# Patient Record
Sex: Female | Born: 1955 | Race: Black or African American | Hispanic: No | Marital: Single | Smoking: Former smoker
Health system: Southern US, Community
[De-identification: ages and names within clinical notes are randomized; demographics above are authoritative.]

## PROBLEM LIST (undated history)

## (undated) DIAGNOSIS — N63 Unspecified lump in unspecified breast: Secondary | ICD-10-CM

## (undated) DIAGNOSIS — C50919 Malignant neoplasm of unspecified site of unspecified female breast: Secondary | ICD-10-CM

## (undated) DIAGNOSIS — G219 Secondary parkinsonism, unspecified: Secondary | ICD-10-CM

## (undated) DIAGNOSIS — Z9289 Personal history of other medical treatment: Secondary | ICD-10-CM

## (undated) DIAGNOSIS — F329 Major depressive disorder, single episode, unspecified: Secondary | ICD-10-CM

## (undated) DIAGNOSIS — G43909 Migraine, unspecified, not intractable, without status migrainosus: Secondary | ICD-10-CM

## (undated) DIAGNOSIS — F209 Schizophrenia, unspecified: Secondary | ICD-10-CM

## (undated) DIAGNOSIS — M25562 Pain in left knee: Secondary | ICD-10-CM

## (undated) DIAGNOSIS — N183 Chronic kidney disease, stage 3 unspecified: Secondary | ICD-10-CM

## (undated) DIAGNOSIS — Z8711 Personal history of peptic ulcer disease: Secondary | ICD-10-CM

## (undated) DIAGNOSIS — F32A Depression, unspecified: Secondary | ICD-10-CM

## (undated) DIAGNOSIS — R000 Tachycardia, unspecified: Secondary | ICD-10-CM

## (undated) DIAGNOSIS — F419 Anxiety disorder, unspecified: Secondary | ICD-10-CM

## (undated) DIAGNOSIS — E039 Hypothyroidism, unspecified: Secondary | ICD-10-CM

## (undated) DIAGNOSIS — J189 Pneumonia, unspecified organism: Secondary | ICD-10-CM

## (undated) DIAGNOSIS — G8929 Other chronic pain: Secondary | ICD-10-CM

## (undated) DIAGNOSIS — M1990 Unspecified osteoarthritis, unspecified site: Secondary | ICD-10-CM

## (undated) DIAGNOSIS — E785 Hyperlipidemia, unspecified: Secondary | ICD-10-CM

## (undated) DIAGNOSIS — Z8719 Personal history of other diseases of the digestive system: Secondary | ICD-10-CM

## (undated) DIAGNOSIS — R51 Headache: Secondary | ICD-10-CM

## (undated) DIAGNOSIS — E119 Type 2 diabetes mellitus without complications: Secondary | ICD-10-CM

## (undated) DIAGNOSIS — Z9989 Dependence on other enabling machines and devices: Secondary | ICD-10-CM

## (undated) DIAGNOSIS — N200 Calculus of kidney: Secondary | ICD-10-CM

## (undated) DIAGNOSIS — M545 Low back pain, unspecified: Secondary | ICD-10-CM

## (undated) DIAGNOSIS — K219 Gastro-esophageal reflux disease without esophagitis: Secondary | ICD-10-CM

## (undated) DIAGNOSIS — Z923 Personal history of irradiation: Secondary | ICD-10-CM

## (undated) DIAGNOSIS — H8110 Benign paroxysmal vertigo, unspecified ear: Secondary | ICD-10-CM

## (undated) DIAGNOSIS — E669 Obesity, unspecified: Secondary | ICD-10-CM

## (undated) DIAGNOSIS — R0600 Dyspnea, unspecified: Secondary | ICD-10-CM

## (undated) DIAGNOSIS — E1142 Type 2 diabetes mellitus with diabetic polyneuropathy: Secondary | ICD-10-CM

## (undated) DIAGNOSIS — G4733 Obstructive sleep apnea (adult) (pediatric): Secondary | ICD-10-CM

## (undated) DIAGNOSIS — K859 Acute pancreatitis without necrosis or infection, unspecified: Secondary | ICD-10-CM

## (undated) DIAGNOSIS — D649 Anemia, unspecified: Secondary | ICD-10-CM

## (undated) DIAGNOSIS — R269 Unspecified abnormalities of gait and mobility: Secondary | ICD-10-CM

## (undated) DIAGNOSIS — R413 Other amnesia: Secondary | ICD-10-CM

## (undated) DIAGNOSIS — I10 Essential (primary) hypertension: Secondary | ICD-10-CM

## (undated) DIAGNOSIS — B009 Herpesviral infection, unspecified: Secondary | ICD-10-CM

## (undated) DIAGNOSIS — E079 Disorder of thyroid, unspecified: Secondary | ICD-10-CM

## (undated) DIAGNOSIS — R519 Headache, unspecified: Secondary | ICD-10-CM

## (undated) DIAGNOSIS — M25561 Pain in right knee: Secondary | ICD-10-CM

## (undated) HISTORY — DX: Depression, unspecified: F32.A

## (undated) HISTORY — DX: Disorder of thyroid, unspecified: E07.9

## (undated) HISTORY — DX: Pain in left knee: M25.562

## (undated) HISTORY — DX: Personal history of irradiation: Z92.3

## (undated) HISTORY — PX: CHOLECYSTECTOMY: SHX55

## (undated) HISTORY — DX: Benign paroxysmal vertigo, unspecified ear: H81.10

## (undated) HISTORY — DX: Obesity, unspecified: E66.9

## (undated) HISTORY — PX: OTHER SURGICAL HISTORY: SHX169

## (undated) HISTORY — DX: Unspecified lump in unspecified breast: N63.0

## (undated) HISTORY — DX: Tachycardia, unspecified: R00.0

## (undated) HISTORY — DX: Secondary parkinsonism, unspecified: G21.9

## (undated) HISTORY — DX: Gastro-esophageal reflux disease without esophagitis: K21.9

## (undated) HISTORY — PX: SHOULDER OPEN ROTATOR CUFF REPAIR: SHX2407

## (undated) HISTORY — PX: TOTAL KNEE ARTHROPLASTY: SHX125

## (undated) HISTORY — PX: FOOT FRACTURE SURGERY: SHX645

## (undated) HISTORY — PX: JOINT REPLACEMENT: SHX530

## (undated) HISTORY — DX: Anxiety disorder, unspecified: F41.9

## (undated) HISTORY — DX: Essential (primary) hypertension: I10

## (undated) HISTORY — DX: Acute pancreatitis without necrosis or infection, unspecified: K85.90

## (undated) HISTORY — DX: Anemia, unspecified: D64.9

## (undated) HISTORY — DX: Major depressive disorder, single episode, unspecified: F32.9

## (undated) HISTORY — DX: Malignant neoplasm of unspecified site of unspecified female breast: C50.919

## (undated) HISTORY — DX: Hyperlipidemia, unspecified: E78.5

## (undated) HISTORY — PX: ABDOMINAL HYSTERECTOMY: SHX81

## (undated) HISTORY — DX: Pain in right knee: M25.561

## (undated) HISTORY — DX: Herpesviral infection, unspecified: B00.9

## (undated) HISTORY — DX: Other amnesia: R41.3

## (undated) HISTORY — DX: Unspecified abnormalities of gait and mobility: R26.9

## (undated) HISTORY — PX: THYROIDECTOMY: SHX17

## (undated) HISTORY — DX: Type 2 diabetes mellitus with diabetic polyneuropathy: E11.42

---

## 1998-05-18 ENCOUNTER — Emergency Department (HOSPITAL_COMMUNITY): Admission: EM | Admit: 1998-05-18 | Discharge: 1998-05-19 | Payer: Self-pay | Admitting: Emergency Medicine

## 1998-08-29 ENCOUNTER — Other Ambulatory Visit: Admission: RE | Admit: 1998-08-29 | Discharge: 1998-08-29 | Payer: Self-pay | Admitting: Obstetrics

## 1998-09-17 ENCOUNTER — Encounter (HOSPITAL_BASED_OUTPATIENT_CLINIC_OR_DEPARTMENT_OTHER): Payer: Self-pay | Admitting: General Surgery

## 1998-09-19 ENCOUNTER — Ambulatory Visit (HOSPITAL_COMMUNITY): Admission: RE | Admit: 1998-09-19 | Discharge: 1998-09-19 | Payer: Self-pay | Admitting: General Surgery

## 1998-12-21 ENCOUNTER — Emergency Department (HOSPITAL_COMMUNITY): Admission: EM | Admit: 1998-12-21 | Discharge: 1998-12-21 | Payer: Self-pay | Admitting: Emergency Medicine

## 1999-01-23 ENCOUNTER — Encounter: Payer: Self-pay | Admitting: Family Medicine

## 1999-01-23 ENCOUNTER — Ambulatory Visit (HOSPITAL_COMMUNITY): Admission: RE | Admit: 1999-01-23 | Discharge: 1999-01-23 | Payer: Self-pay | Admitting: Family Medicine

## 1999-03-18 ENCOUNTER — Encounter (HOSPITAL_BASED_OUTPATIENT_CLINIC_OR_DEPARTMENT_OTHER): Payer: Self-pay | Admitting: General Surgery

## 1999-03-18 ENCOUNTER — Encounter: Admission: RE | Admit: 1999-03-18 | Discharge: 1999-03-18 | Payer: Self-pay | Admitting: General Surgery

## 1999-05-30 ENCOUNTER — Ambulatory Visit (HOSPITAL_COMMUNITY): Admission: RE | Admit: 1999-05-30 | Discharge: 1999-05-30 | Payer: Self-pay | Admitting: *Deleted

## 1999-07-31 ENCOUNTER — Other Ambulatory Visit: Admission: RE | Admit: 1999-07-31 | Discharge: 1999-07-31 | Payer: Self-pay | Admitting: Obstetrics

## 1999-08-02 ENCOUNTER — Encounter: Payer: Self-pay | Admitting: Obstetrics

## 1999-08-02 ENCOUNTER — Encounter: Admission: RE | Admit: 1999-08-02 | Discharge: 1999-08-02 | Payer: Self-pay | Admitting: Obstetrics

## 1999-08-07 ENCOUNTER — Ambulatory Visit: Admission: RE | Admit: 1999-08-07 | Discharge: 1999-08-07 | Payer: Self-pay | Admitting: Gynecology

## 1999-08-12 ENCOUNTER — Encounter: Payer: Self-pay | Admitting: Gynecology

## 1999-08-12 ENCOUNTER — Ambulatory Visit (HOSPITAL_COMMUNITY): Admission: RE | Admit: 1999-08-12 | Discharge: 1999-08-12 | Payer: Self-pay | Admitting: Gynecology

## 1999-08-15 ENCOUNTER — Emergency Department (HOSPITAL_COMMUNITY): Admission: EM | Admit: 1999-08-15 | Discharge: 1999-08-15 | Payer: Self-pay | Admitting: Emergency Medicine

## 1999-09-27 ENCOUNTER — Ambulatory Visit (HOSPITAL_COMMUNITY): Admission: RE | Admit: 1999-09-27 | Discharge: 1999-09-27 | Payer: Self-pay | Admitting: Family Medicine

## 1999-09-27 ENCOUNTER — Encounter: Payer: Self-pay | Admitting: Family Medicine

## 1999-09-29 ENCOUNTER — Emergency Department (HOSPITAL_COMMUNITY): Admission: EM | Admit: 1999-09-29 | Discharge: 1999-09-29 | Payer: Self-pay | Admitting: Emergency Medicine

## 1999-10-05 ENCOUNTER — Emergency Department (HOSPITAL_COMMUNITY): Admission: EM | Admit: 1999-10-05 | Discharge: 1999-10-05 | Payer: Self-pay

## 1999-10-10 ENCOUNTER — Ambulatory Visit (HOSPITAL_BASED_OUTPATIENT_CLINIC_OR_DEPARTMENT_OTHER): Admission: RE | Admit: 1999-10-10 | Discharge: 1999-10-10 | Payer: Self-pay | Admitting: Family Medicine

## 1999-10-30 ENCOUNTER — Encounter: Admission: RE | Admit: 1999-10-30 | Discharge: 1999-12-10 | Payer: Self-pay | Admitting: Orthopedic Surgery

## 2001-07-19 ENCOUNTER — Encounter (INDEPENDENT_AMBULATORY_CARE_PROVIDER_SITE_OTHER): Payer: Self-pay | Admitting: Specialist

## 2001-07-20 ENCOUNTER — Encounter: Payer: Self-pay | Admitting: Internal Medicine

## 2001-07-20 ENCOUNTER — Inpatient Hospital Stay (HOSPITAL_COMMUNITY): Admission: EM | Admit: 2001-07-20 | Discharge: 2001-07-26 | Payer: Self-pay | Admitting: Emergency Medicine

## 2001-07-24 ENCOUNTER — Encounter: Payer: Self-pay | Admitting: Internal Medicine

## 2003-07-10 ENCOUNTER — Emergency Department (HOSPITAL_COMMUNITY): Admission: EM | Admit: 2003-07-10 | Discharge: 2003-07-10 | Payer: Self-pay | Admitting: Emergency Medicine

## 2004-04-02 ENCOUNTER — Ambulatory Visit (HOSPITAL_BASED_OUTPATIENT_CLINIC_OR_DEPARTMENT_OTHER): Admission: RE | Admit: 2004-04-02 | Discharge: 2004-04-02 | Payer: Self-pay | Admitting: Orthopedic Surgery

## 2004-04-10 ENCOUNTER — Inpatient Hospital Stay (HOSPITAL_COMMUNITY): Admission: RE | Admit: 2004-04-10 | Discharge: 2004-04-11 | Payer: Self-pay | Admitting: Orthopedic Surgery

## 2004-06-12 ENCOUNTER — Ambulatory Visit (HOSPITAL_BASED_OUTPATIENT_CLINIC_OR_DEPARTMENT_OTHER): Admission: RE | Admit: 2004-06-12 | Discharge: 2004-06-12 | Payer: Self-pay | Admitting: Family Medicine

## 2004-06-25 ENCOUNTER — Ambulatory Visit (HOSPITAL_BASED_OUTPATIENT_CLINIC_OR_DEPARTMENT_OTHER): Admission: RE | Admit: 2004-06-25 | Discharge: 2004-06-25 | Payer: Self-pay | Admitting: Family Medicine

## 2004-06-30 ENCOUNTER — Ambulatory Visit: Payer: Self-pay | Admitting: Internal Medicine

## 2004-09-02 ENCOUNTER — Emergency Department (HOSPITAL_COMMUNITY): Admission: EM | Admit: 2004-09-02 | Discharge: 2004-09-02 | Payer: Self-pay | Admitting: Emergency Medicine

## 2004-11-28 ENCOUNTER — Encounter: Admission: RE | Admit: 2004-11-28 | Discharge: 2004-12-16 | Payer: Self-pay | Admitting: Orthopedic Surgery

## 2004-12-28 ENCOUNTER — Emergency Department (HOSPITAL_COMMUNITY): Admission: EM | Admit: 2004-12-28 | Discharge: 2004-12-29 | Payer: Self-pay | Admitting: Emergency Medicine

## 2005-01-04 IMAGING — CR DG ANKLE COMPLETE 3+V*R*
2 series · 2 of 2 positions shown · non-contrast
Comparison: none

CLINICAL DATA: Twisted foot.
 RIGHT ANKLE (THREE VIEWS)
 No acute abnormality.  There are mild degenerative changes.
 IMPRESSION
 No acute abnormality.

[view not recorded (1 of 2)]
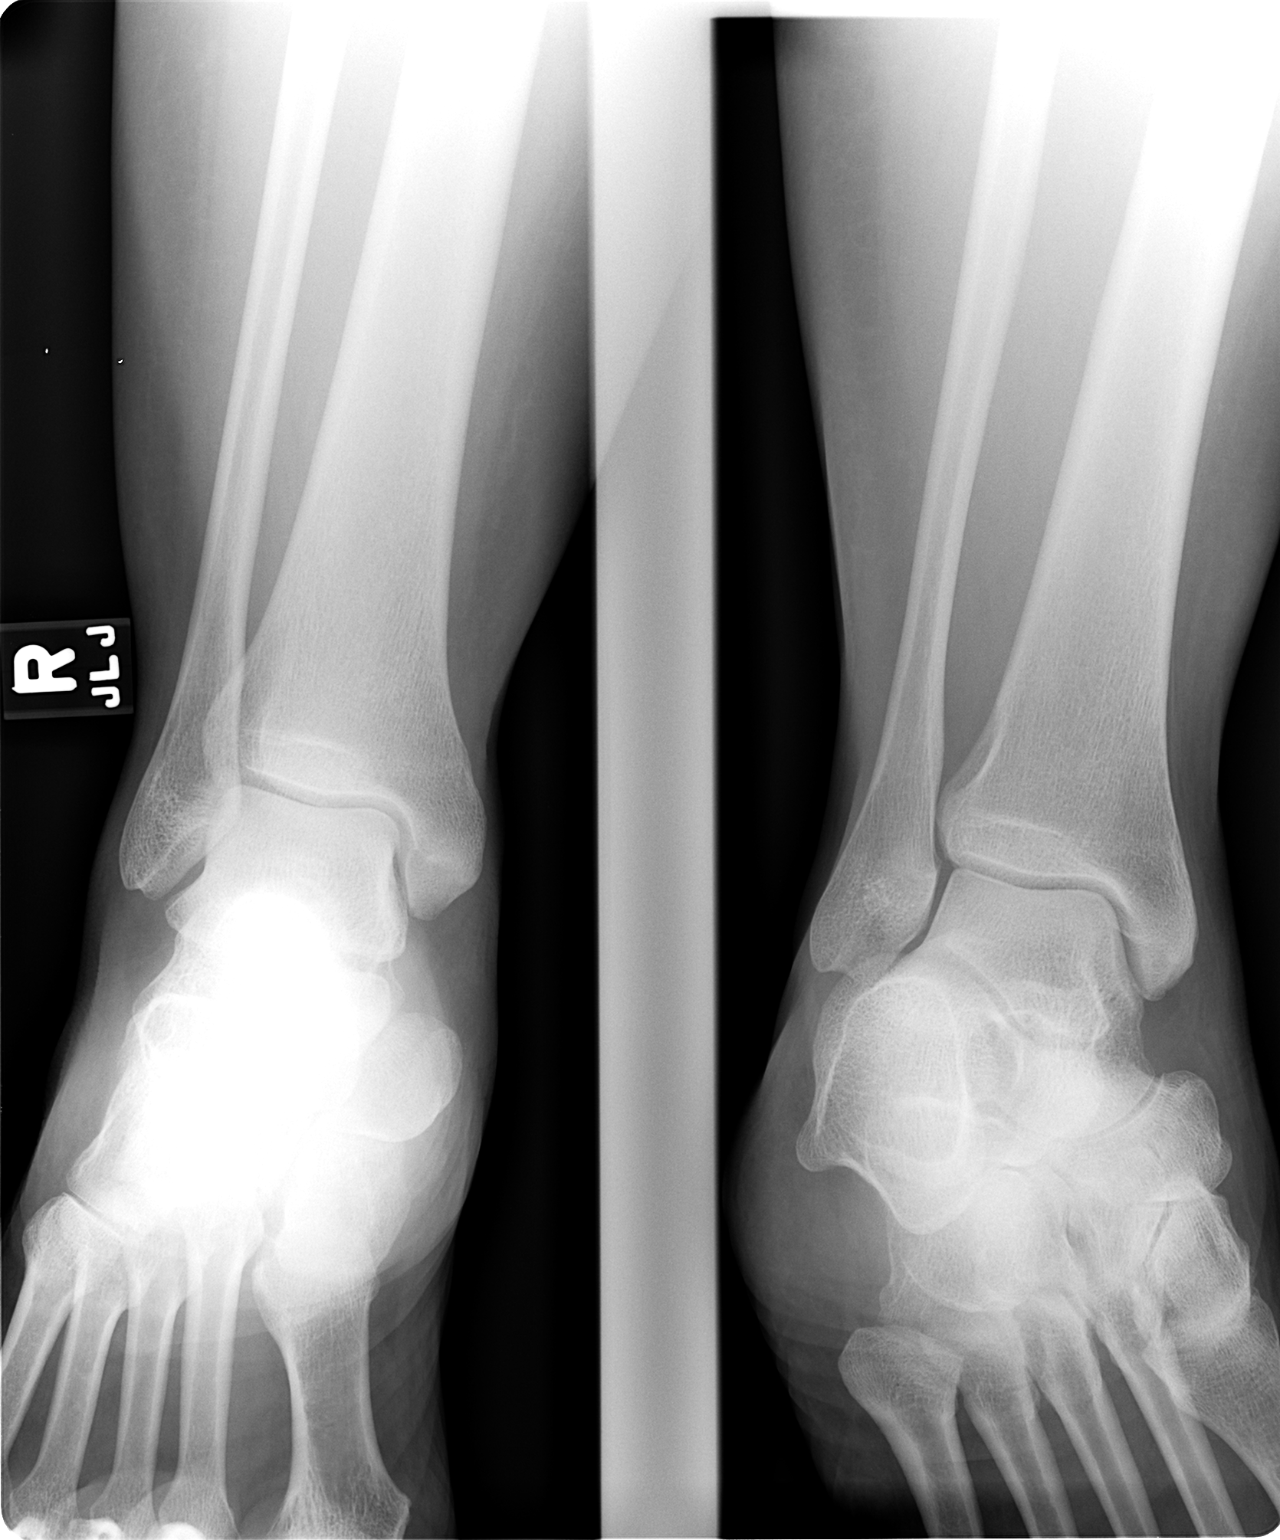

[view not recorded (2 of 2)]
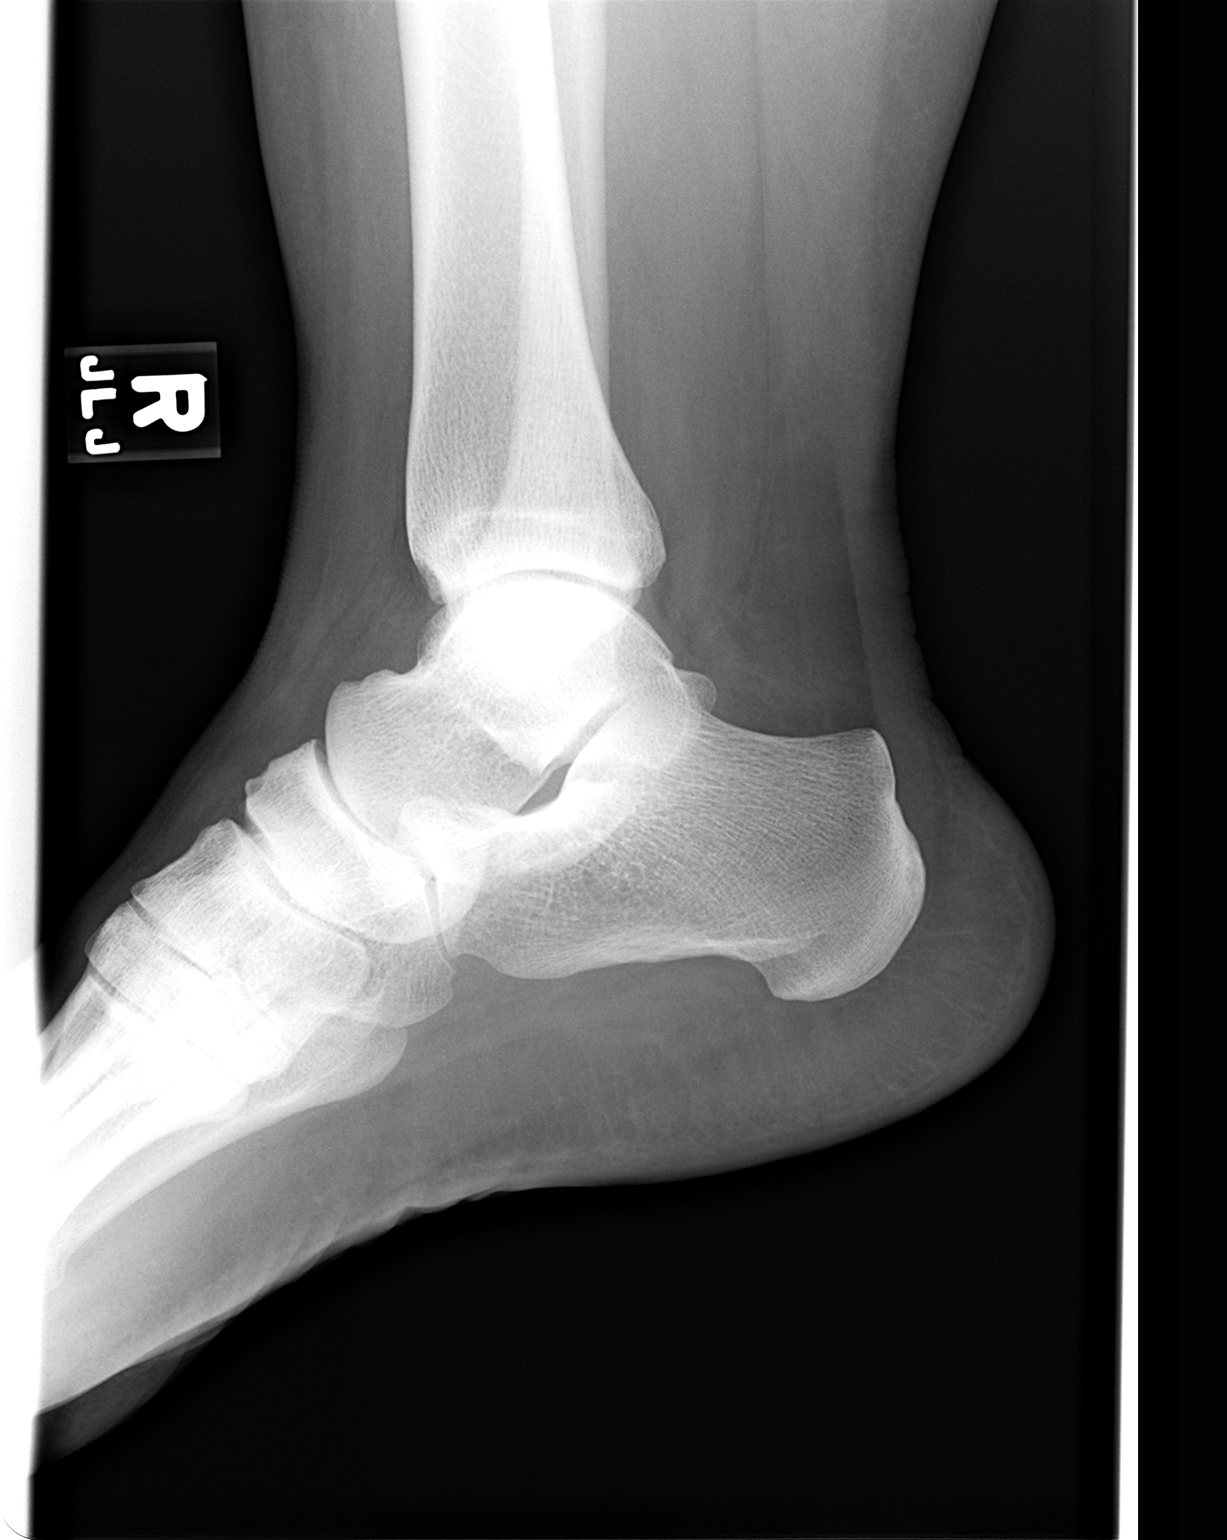

[2 of 2 positions shown; findings below may reference images not displayed]

## 2005-01-04 IMAGING — CR DG FOOT COMPLETE 3+V*R*
2 series · 2 of 2 positions shown · non-contrast
Comparison: none

CLINICAL DATA: Twisted foot three months ago.  Has persistent pain. 
 RIGHT FOOT (THREE VIEWS)
 No evidence of acute bony abnormality. 
 IMPRESSION
 No bony abnormality. 
 If symptoms do persist, isotope bone scan would be suggested.

[view not recorded (1 of 2)]
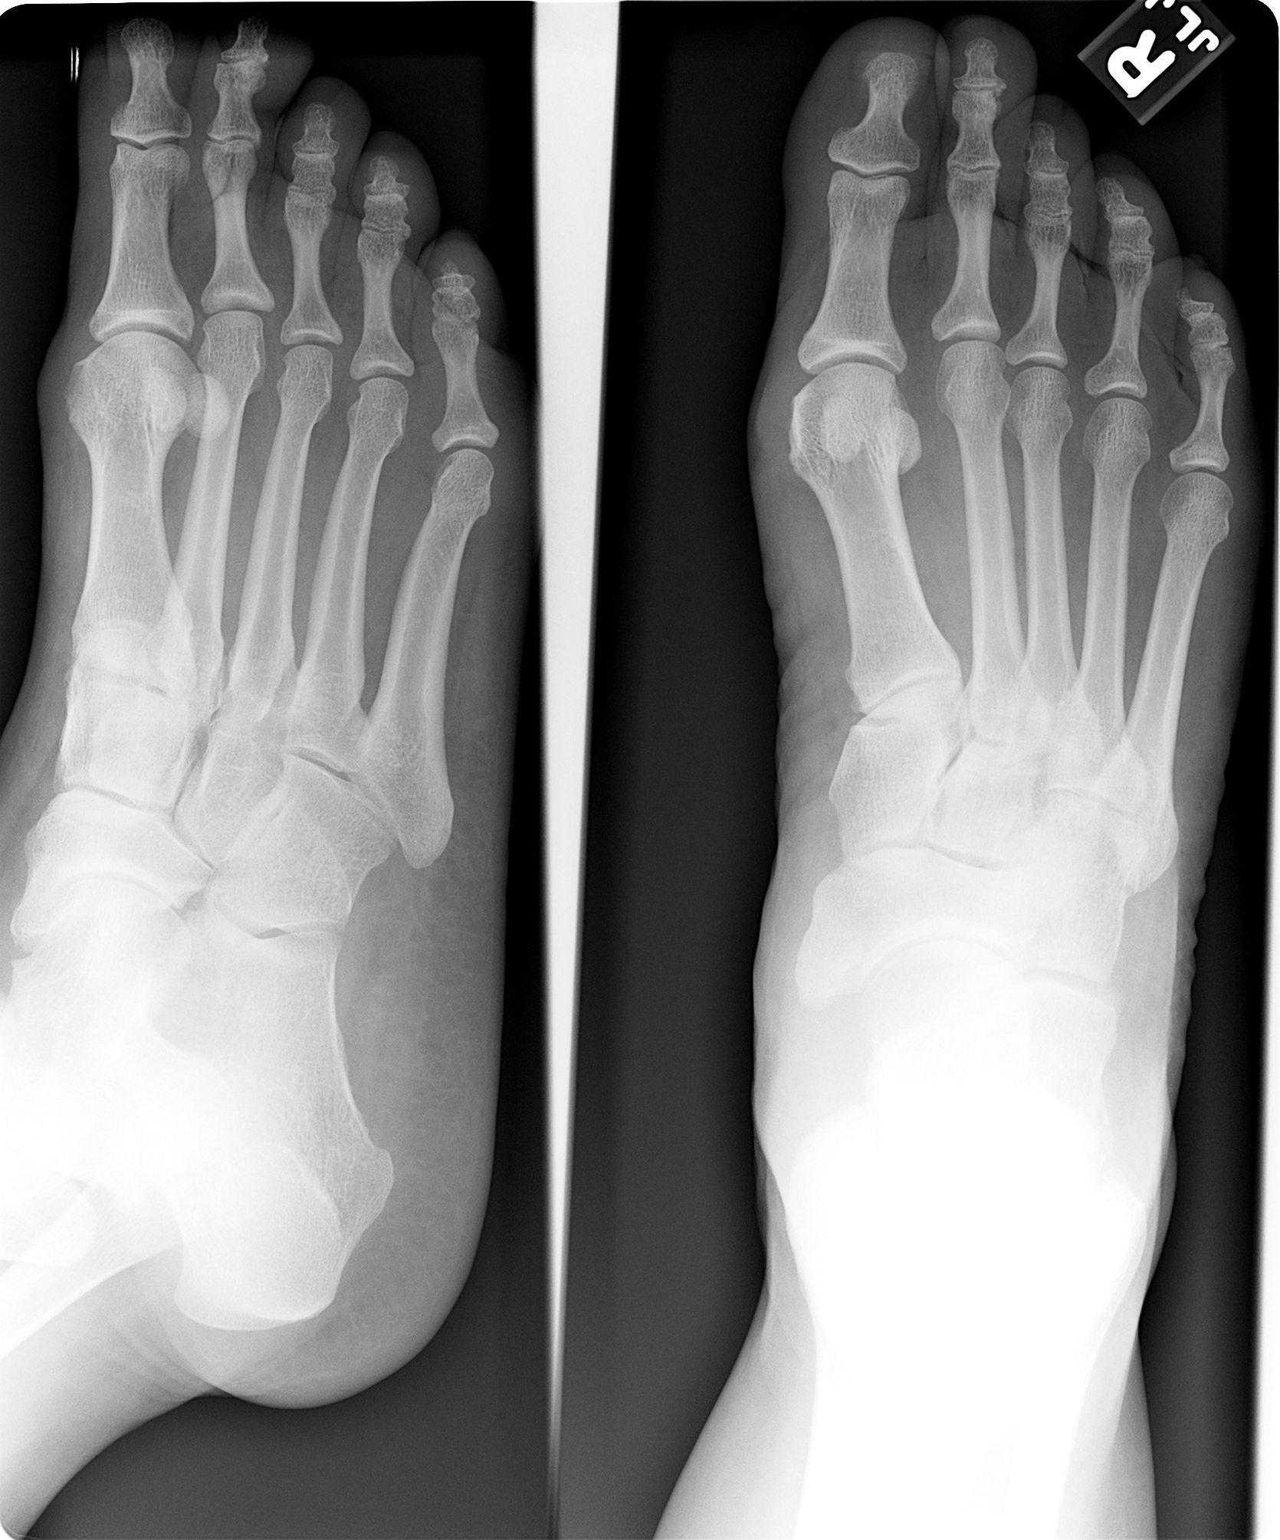

[view not recorded (2 of 2)]
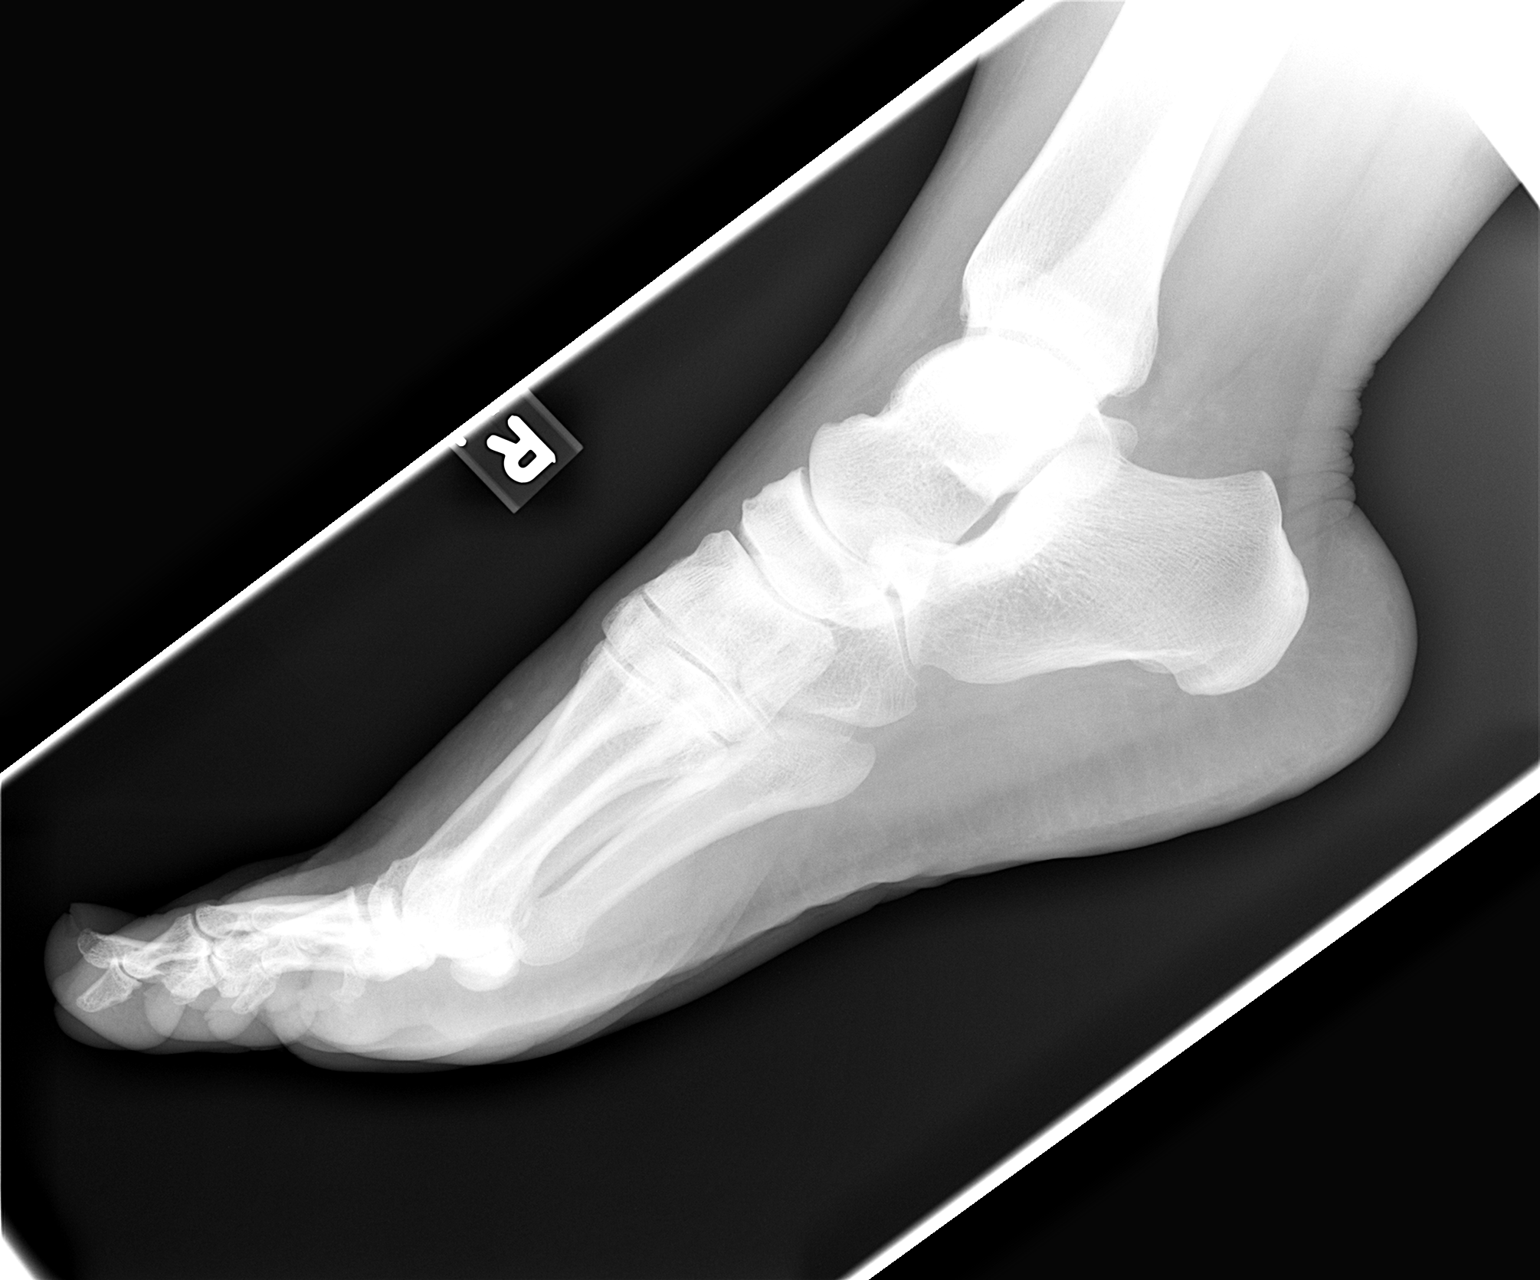

[2 of 2 positions shown; findings below may reference images not displayed]

## 2005-01-30 ENCOUNTER — Ambulatory Visit: Payer: Self-pay | Admitting: Internal Medicine

## 2005-03-14 ENCOUNTER — Emergency Department (HOSPITAL_COMMUNITY): Admission: EM | Admit: 2005-03-14 | Discharge: 2005-03-14 | Payer: Self-pay | Admitting: Family Medicine

## 2005-03-25 ENCOUNTER — Ambulatory Visit: Payer: Self-pay | Admitting: Internal Medicine

## 2005-03-26 ENCOUNTER — Ambulatory Visit: Payer: Self-pay | Admitting: Sports Medicine

## 2005-04-16 ENCOUNTER — Ambulatory Visit: Payer: Self-pay | Admitting: Sports Medicine

## 2005-05-14 ENCOUNTER — Ambulatory Visit: Payer: Self-pay | Admitting: Sports Medicine

## 2005-05-18 ENCOUNTER — Emergency Department (HOSPITAL_COMMUNITY): Admission: AD | Admit: 2005-05-18 | Discharge: 2005-05-18 | Payer: Self-pay | Admitting: Family Medicine

## 2005-06-09 ENCOUNTER — Encounter: Admission: RE | Admit: 2005-06-09 | Discharge: 2005-09-07 | Payer: Self-pay | Admitting: Sports Medicine

## 2005-06-11 ENCOUNTER — Ambulatory Visit: Payer: Self-pay | Admitting: Internal Medicine

## 2005-06-20 ENCOUNTER — Encounter: Admission: RE | Admit: 2005-06-20 | Discharge: 2005-06-20 | Payer: Self-pay | Admitting: Internal Medicine

## 2005-06-25 ENCOUNTER — Ambulatory Visit: Payer: Self-pay | Admitting: Sports Medicine

## 2005-06-25 ENCOUNTER — Ambulatory Visit: Payer: Self-pay | Admitting: Internal Medicine

## 2005-07-02 ENCOUNTER — Ambulatory Visit: Payer: Self-pay | Admitting: Internal Medicine

## 2005-07-16 ENCOUNTER — Ambulatory Visit: Payer: Self-pay | Admitting: Sports Medicine

## 2005-07-30 ENCOUNTER — Ambulatory Visit: Payer: Self-pay | Admitting: Internal Medicine

## 2005-07-30 ENCOUNTER — Inpatient Hospital Stay (HOSPITAL_COMMUNITY): Admission: EM | Admit: 2005-07-30 | Discharge: 2005-08-06 | Payer: Self-pay | Admitting: *Deleted

## 2005-08-11 ENCOUNTER — Ambulatory Visit: Payer: Self-pay | Admitting: Internal Medicine

## 2005-08-19 ENCOUNTER — Ambulatory Visit: Payer: Self-pay | Admitting: Hospitalist

## 2005-08-28 ENCOUNTER — Ambulatory Visit: Payer: Self-pay | Admitting: Internal Medicine

## 2005-09-01 ENCOUNTER — Ambulatory Visit: Payer: Self-pay | Admitting: Internal Medicine

## 2005-09-15 ENCOUNTER — Ambulatory Visit: Payer: Self-pay | Admitting: Hospitalist

## 2005-10-01 IMAGING — CR DG CHEST 2V
2 series · 2 of 2 positions shown · non-contrast
Comparison: none

CLINICAL DATA: Preop for right tendinitis.  No present chest complaints.  Patient has hypertension.
 TWO VIEW CHEST:
 PA and lateral views of the chest are made and compared to the previous studies of 07/19/01 and show interval improvement in the atelectasis that was noted at the left base previously.  There remains some diffuse peribronchial thickening despite elevation of the right hemidiaphragm.   The surgical clips in the base of the right neck have not changed.  The heart is within the limits of normal.  Bony thorax appears normal.

[view not recorded (1 of 2)]
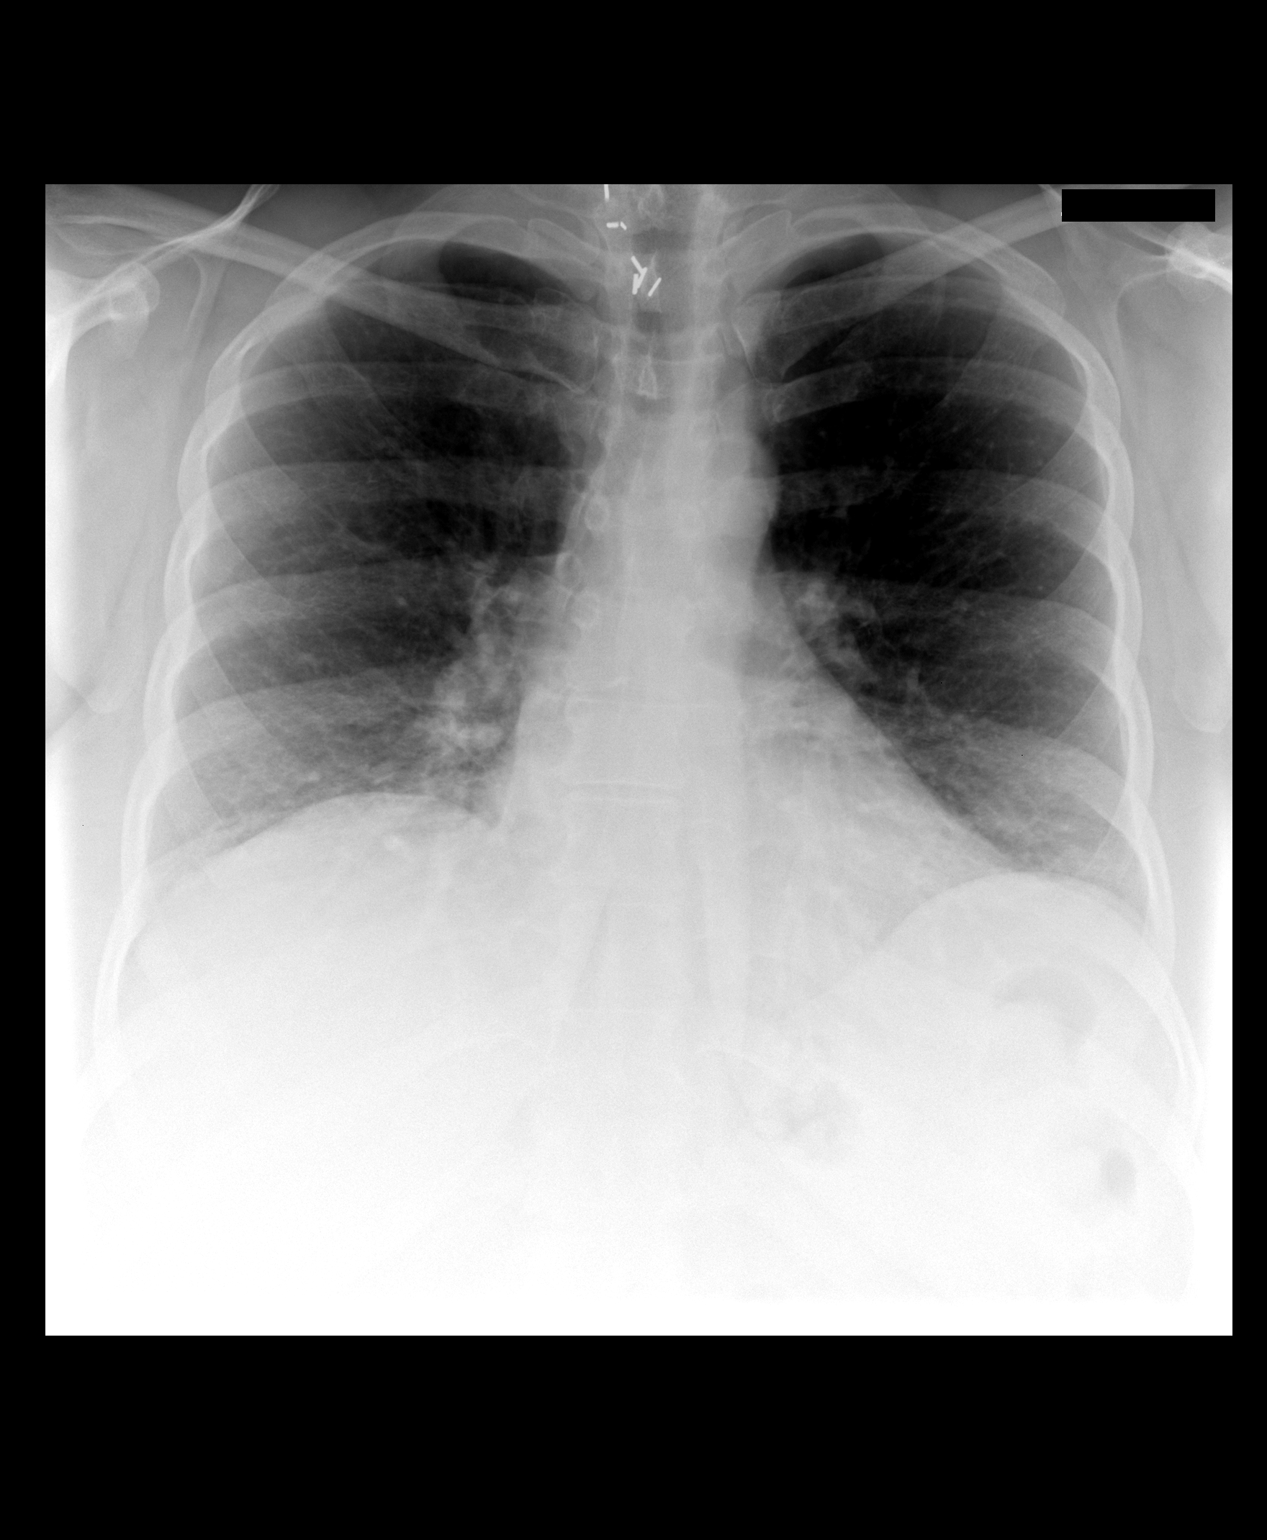

[view not recorded (2 of 2)]
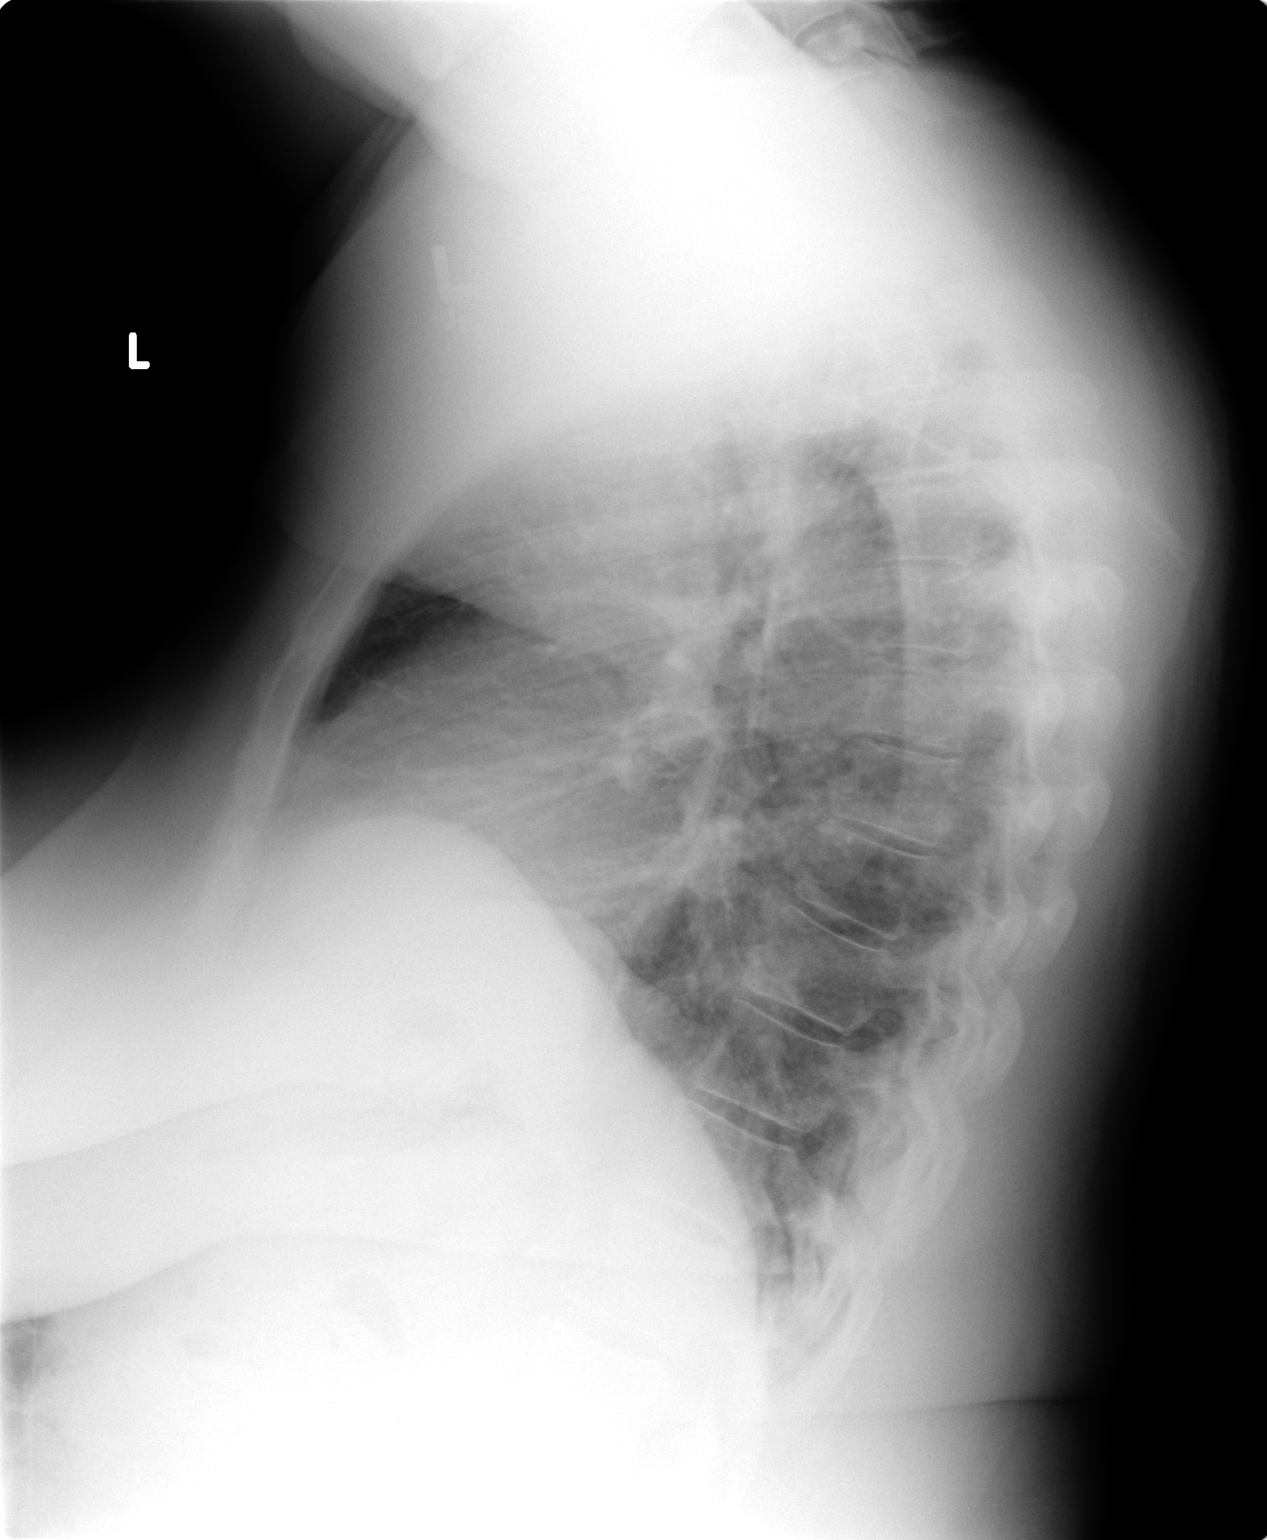

[2 of 2 positions shown; findings below may reference images not displayed]

IMPRESSION: Mild diffuse peribronchial thickening.  No evidence of active disease.  There has been interval improvement in aeration of the left lung base.

## 2005-10-06 ENCOUNTER — Emergency Department (HOSPITAL_COMMUNITY): Admission: EM | Admit: 2005-10-06 | Discharge: 2005-10-06 | Payer: Self-pay | Admitting: Emergency Medicine

## 2005-10-08 ENCOUNTER — Ambulatory Visit: Payer: Self-pay | Admitting: Internal Medicine

## 2005-10-15 ENCOUNTER — Ambulatory Visit: Payer: Self-pay | Admitting: Hospitalist

## 2005-10-15 ENCOUNTER — Ambulatory Visit (HOSPITAL_COMMUNITY): Admission: RE | Admit: 2005-10-15 | Discharge: 2005-10-15 | Payer: Self-pay | Admitting: Hospitalist

## 2005-11-24 ENCOUNTER — Ambulatory Visit: Payer: Self-pay | Admitting: Internal Medicine

## 2005-11-28 ENCOUNTER — Ambulatory Visit (HOSPITAL_COMMUNITY): Admission: RE | Admit: 2005-11-28 | Discharge: 2005-11-28 | Payer: Self-pay | Admitting: Internal Medicine

## 2005-12-04 ENCOUNTER — Ambulatory Visit: Payer: Self-pay

## 2005-12-10 ENCOUNTER — Ambulatory Visit: Payer: Self-pay | Admitting: Internal Medicine

## 2005-12-17 ENCOUNTER — Ambulatory Visit (HOSPITAL_COMMUNITY): Admission: RE | Admit: 2005-12-17 | Discharge: 2005-12-17 | Payer: Self-pay | Admitting: Orthopedic Surgery

## 2006-01-06 ENCOUNTER — Ambulatory Visit (HOSPITAL_BASED_OUTPATIENT_CLINIC_OR_DEPARTMENT_OTHER): Admission: RE | Admit: 2006-01-06 | Discharge: 2006-01-06 | Payer: Self-pay | Admitting: Orthopedic Surgery

## 2006-02-06 ENCOUNTER — Emergency Department (HOSPITAL_COMMUNITY): Admission: EM | Admit: 2006-02-06 | Discharge: 2006-02-06 | Payer: Self-pay | Admitting: Family Medicine

## 2006-02-20 ENCOUNTER — Ambulatory Visit: Payer: Self-pay | Admitting: Internal Medicine

## 2006-02-20 ENCOUNTER — Encounter (INDEPENDENT_AMBULATORY_CARE_PROVIDER_SITE_OTHER): Payer: Self-pay | Admitting: Infectious Diseases

## 2006-02-20 LAB — CONVERTED CEMR LAB
ALT: 31 units/L (ref 0–35)
AST: 33 units/L (ref 0–37)
Alkaline Phosphatase: 63 units/L (ref 39–117)
CO2: 24 meq/L (ref 19–32)
Creatinine, Ser: 0.83 mg/dL (ref 0.40–1.20)
Sodium: 140 meq/L (ref 135–145)
Total Bilirubin: 0.2 mg/dL — ABNORMAL LOW (ref 0.3–1.2)
Total Protein: 7.2 g/dL (ref 6.0–8.3)

## 2006-02-27 DIAGNOSIS — E039 Hypothyroidism, unspecified: Secondary | ICD-10-CM | POA: Insufficient documentation

## 2006-02-27 DIAGNOSIS — N63 Unspecified lump in unspecified breast: Secondary | ICD-10-CM | POA: Insufficient documentation

## 2006-02-27 DIAGNOSIS — F329 Major depressive disorder, single episode, unspecified: Secondary | ICD-10-CM

## 2006-02-27 DIAGNOSIS — M25569 Pain in unspecified knee: Secondary | ICD-10-CM

## 2006-02-27 DIAGNOSIS — I10 Essential (primary) hypertension: Secondary | ICD-10-CM

## 2006-02-27 DIAGNOSIS — Z8719 Personal history of other diseases of the digestive system: Secondary | ICD-10-CM | POA: Insufficient documentation

## 2006-02-27 DIAGNOSIS — K219 Gastro-esophageal reflux disease without esophagitis: Secondary | ICD-10-CM | POA: Insufficient documentation

## 2006-02-28 IMAGING — CR DG FOOT COMPLETE 3+V*R*
3 series · 3 of 3 positions shown · non-contrast
Comparison: none

CLINICAL DATA: Injured right foot.  Felt pop in right foot near fifth metatarsal base.  Now having pain on medial and lateral sides of foot near ankle.  Prior surgery.
RIGHT FOOT - 3 VIEWS:
Two partially-threaded screws oblique traverse the mid to posterior aspect of the calcaneus at the site of healed fracture.   There is a fully-threaded screw traversing the anteroinferior aspect of the calcaneus.   No acute bony abnormality.   Somewhat prominent plantar arch.   Bunion formation.

[t foot ap right]
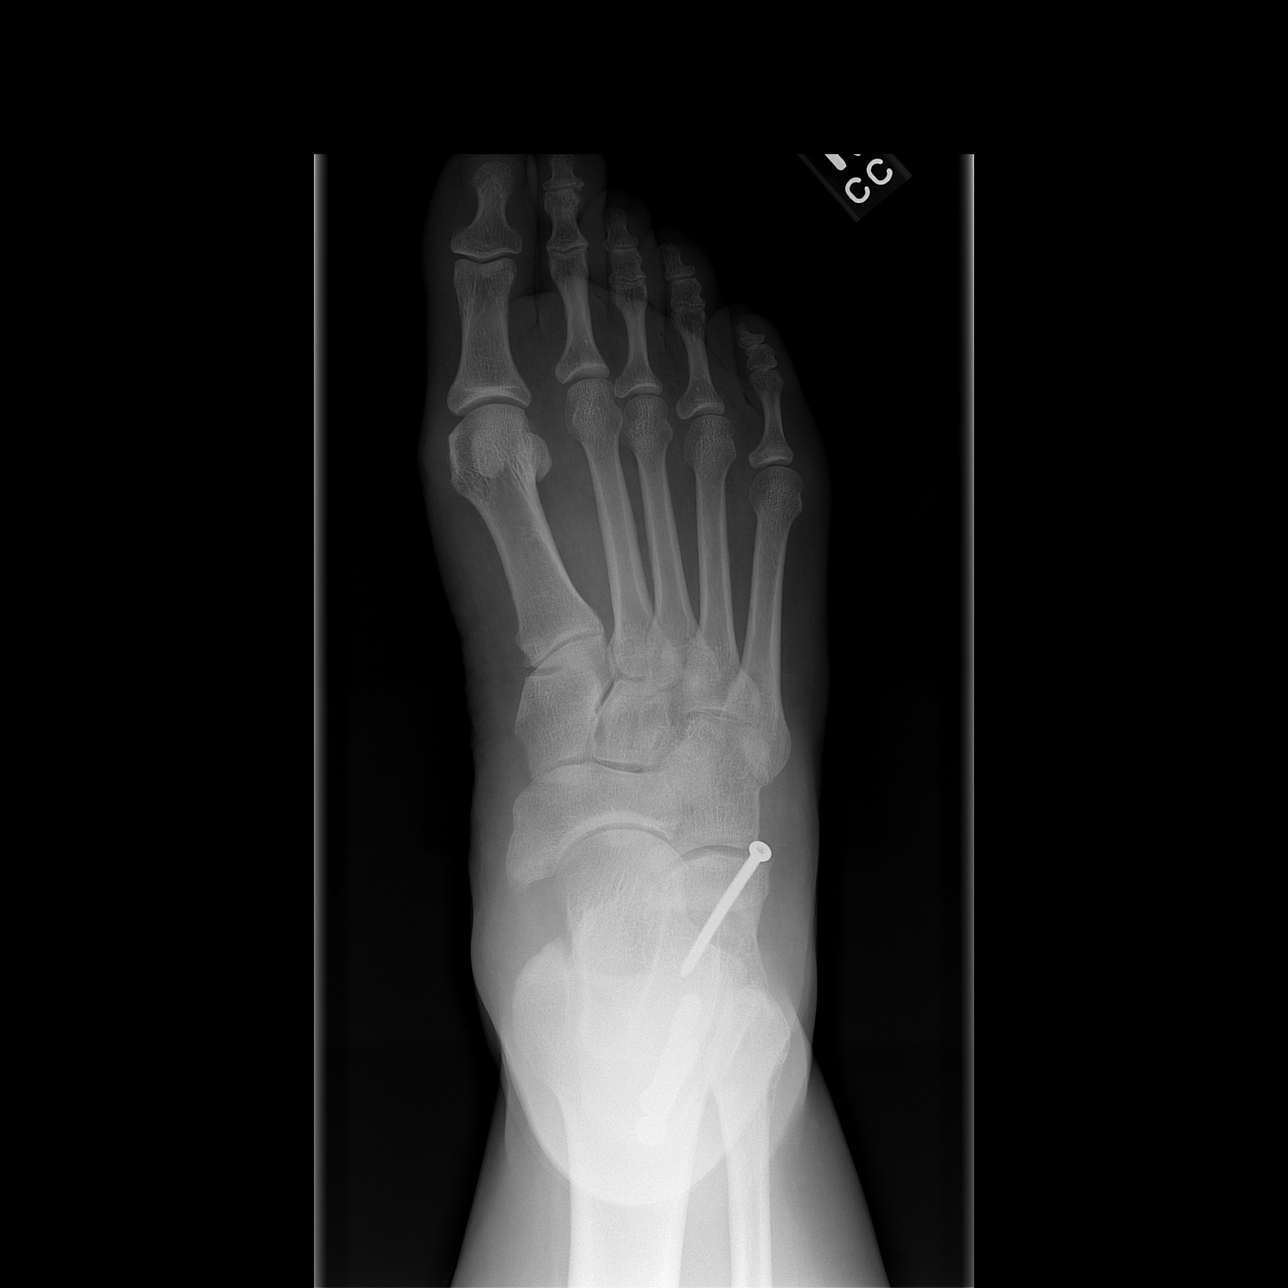

[t foot oblique right]
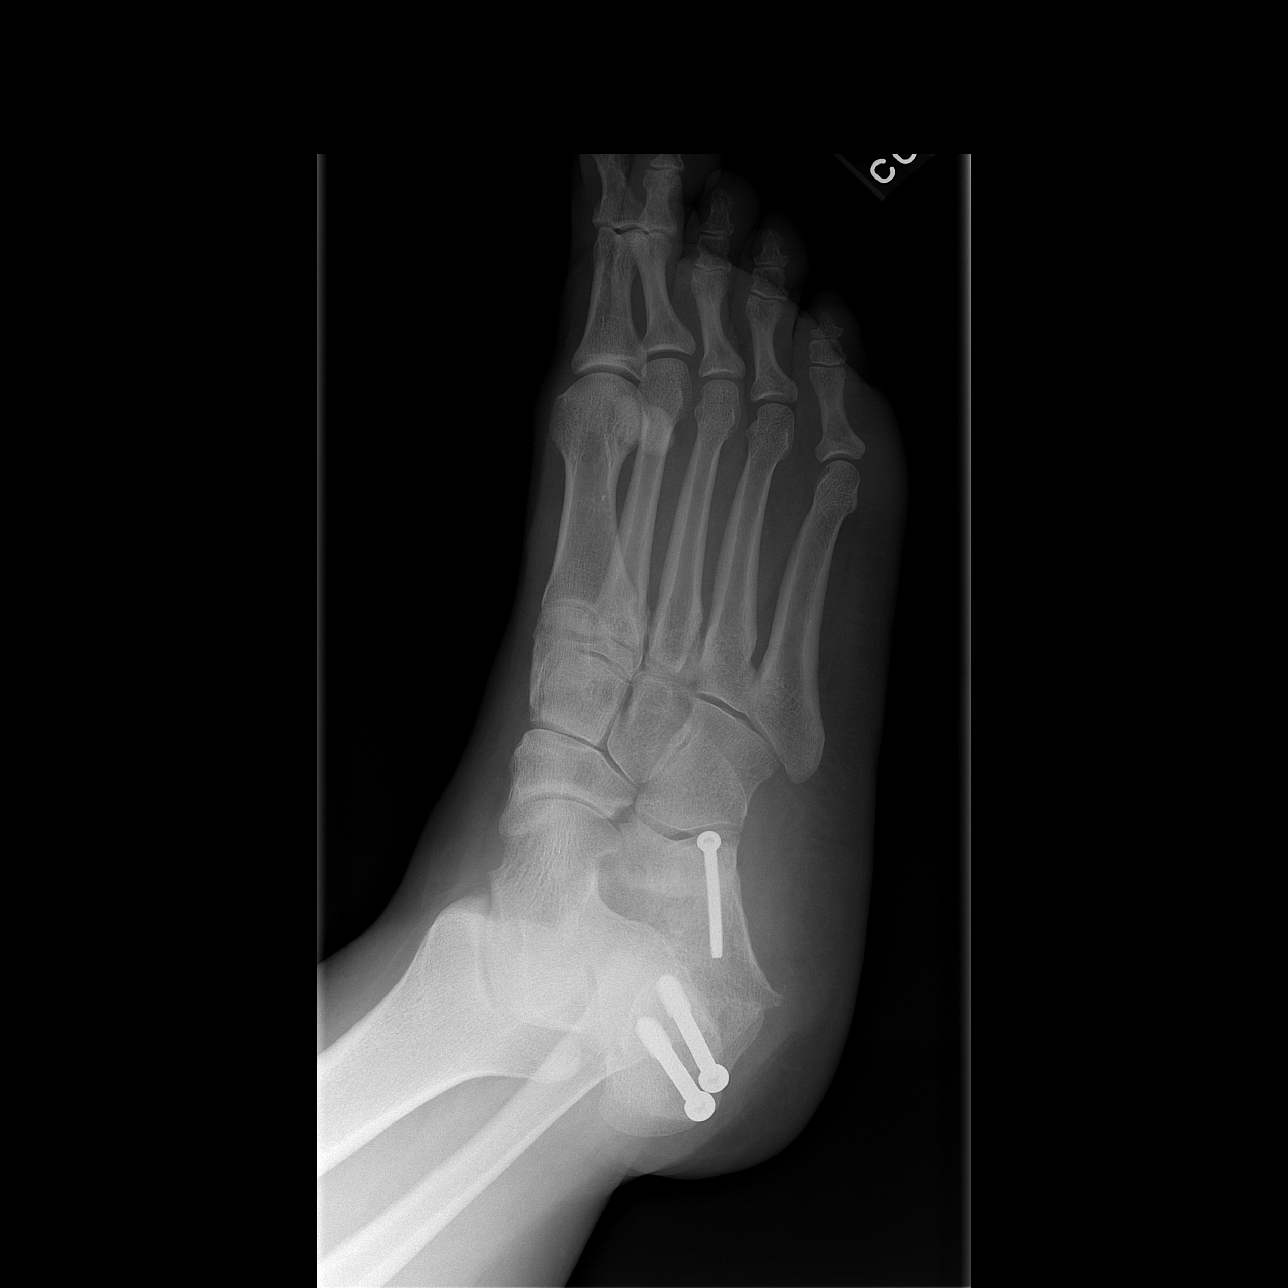

[t foot lat right]
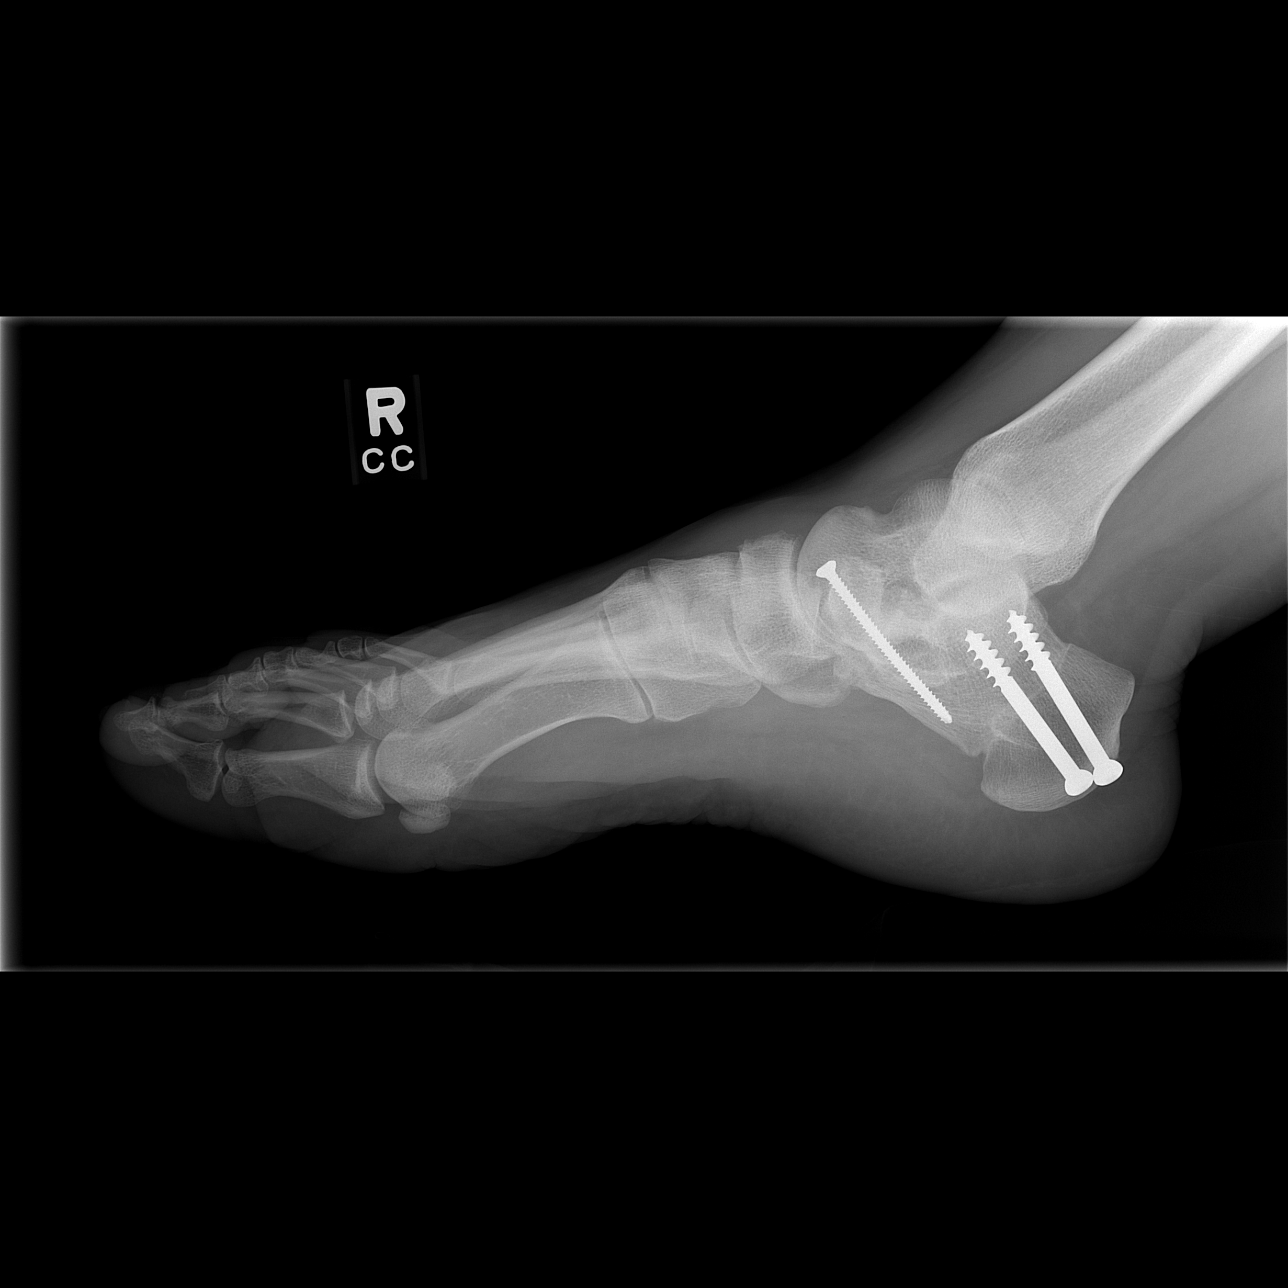

[3 of 3 positions shown; findings below may reference images not displayed]

IMPRESSION: Postoperative changes.   No acute osseous findings.

## 2006-03-02 ENCOUNTER — Ambulatory Visit: Payer: Self-pay | Admitting: Internal Medicine

## 2006-03-02 ENCOUNTER — Encounter (INDEPENDENT_AMBULATORY_CARE_PROVIDER_SITE_OTHER): Payer: Self-pay | Admitting: Internal Medicine

## 2006-03-02 LAB — CONVERTED CEMR LAB
BUN: 14 mg/dL (ref 6–23)
Barbiturate Quant, Ur: NEGATIVE
Bilirubin Urine: NEGATIVE
CO2: 22 meq/L (ref 19–32)
Calcium: 9.4 mg/dL (ref 8.4–10.5)
Creatinine, Ser: 0.97 mg/dL (ref 0.40–1.20)
Glucose, Bld: 101 mg/dL — ABNORMAL HIGH (ref 70–99)
Hemoglobin, Urine: NEGATIVE
Ketones, ur: NEGATIVE mg/dL
Marijuana Metabolite: NEGATIVE
Nitrite: NEGATIVE
Opiates: POSITIVE — AB
Phencyclidine (PCP): NEGATIVE
Propoxyphene: NEGATIVE
Specific Gravity, Urine: 1.026 (ref 1.005–1.03)
TSH: 19.998 microintl units/mL — ABNORMAL HIGH (ref 0.350–5.50)
Urobilinogen, UA: 1 (ref 0.0–1.0)

## 2006-03-30 ENCOUNTER — Encounter: Payer: Self-pay | Admitting: *Deleted

## 2006-04-15 ENCOUNTER — Encounter: Admission: RE | Admit: 2006-04-15 | Discharge: 2006-05-12 | Payer: Self-pay | Admitting: Orthopedic Surgery

## 2006-04-17 ENCOUNTER — Ambulatory Visit: Payer: Self-pay | Admitting: Internal Medicine

## 2006-05-05 ENCOUNTER — Telehealth: Payer: Self-pay | Admitting: *Deleted

## 2006-05-13 ENCOUNTER — Telehealth: Payer: Self-pay | Admitting: *Deleted

## 2006-05-25 ENCOUNTER — Encounter (INDEPENDENT_AMBULATORY_CARE_PROVIDER_SITE_OTHER): Payer: Self-pay | Admitting: *Deleted

## 2006-05-25 ENCOUNTER — Telehealth: Payer: Self-pay | Admitting: *Deleted

## 2006-05-25 ENCOUNTER — Ambulatory Visit: Payer: Self-pay | Admitting: Internal Medicine

## 2006-05-25 DIAGNOSIS — R000 Tachycardia, unspecified: Secondary | ICD-10-CM

## 2006-05-25 LAB — CONVERTED CEMR LAB
BUN: 15 mg/dL (ref 6–23)
CO2: 23 meq/L (ref 19–32)
Calcium: 9.7 mg/dL (ref 8.4–10.5)
Chloride: 101 meq/L (ref 96–112)
Creatinine, Ser: 1.11 mg/dL (ref 0.40–1.20)

## 2006-06-02 ENCOUNTER — Encounter (INDEPENDENT_AMBULATORY_CARE_PROVIDER_SITE_OTHER): Payer: Self-pay | Admitting: Internal Medicine

## 2006-06-02 ENCOUNTER — Ambulatory Visit: Payer: Self-pay | Admitting: Internal Medicine

## 2006-06-02 DIAGNOSIS — E785 Hyperlipidemia, unspecified: Secondary | ICD-10-CM | POA: Insufficient documentation

## 2006-06-02 LAB — CONVERTED CEMR LAB: TSH: 0.412 microintl units/mL (ref 0.350–5.50)

## 2006-06-16 ENCOUNTER — Ambulatory Visit: Payer: Self-pay | Admitting: Hospitalist

## 2006-06-16 ENCOUNTER — Encounter (INDEPENDENT_AMBULATORY_CARE_PROVIDER_SITE_OTHER): Payer: Self-pay | Admitting: Internal Medicine

## 2006-06-18 LAB — CONVERTED CEMR LAB
CO2: 22 meq/L (ref 19–32)
Calcium: 9.4 mg/dL (ref 8.4–10.5)
Cholesterol: 260 mg/dL — ABNORMAL HIGH (ref 0–200)
Creatinine, Ser: 1.09 mg/dL (ref 0.40–1.20)
Glucose, Bld: 126 mg/dL — ABNORMAL HIGH (ref 70–99)

## 2006-06-23 ENCOUNTER — Telehealth (INDEPENDENT_AMBULATORY_CARE_PROVIDER_SITE_OTHER): Payer: Self-pay | Admitting: Pharmacy Technician

## 2006-06-26 IMAGING — CT CT PELVIS W/ CM
2 of 6 series · 17 of 46 positions shown, 19 images · IV contrast (APPLIED)
Comparison: none

CLINICAL DATA: Periumbilical pain.
 ABDOMEN CT WITH CONTRAST:
TECHNIQUE: Multidetector CT imaging of the abdomen was performed following the standard protocol during bolus administration of intravenous contrast.
 Contrast:  125 ml Omnipaque 300.
TECHNIQUE: Multidetector CT imaging of the pelvis was performed following the standard protocol during bolus administration of intravenous contrast.

[Series 4: thin sections 2.0 b40f st · axial · 0.81mm/px · z∈[-559,-92]mm · 14 of 726 slices shown, 16 images]
[im 30/726  soft-tissue]
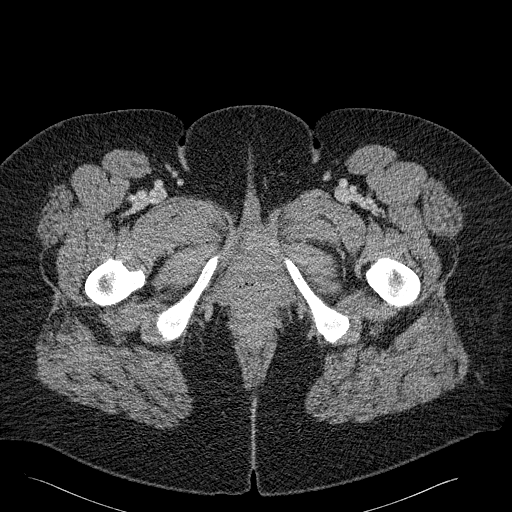
[im 30/726  bone]
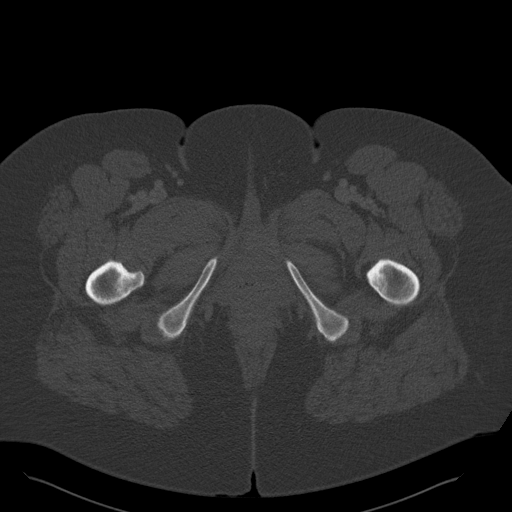
[im 88/726  soft-tissue]
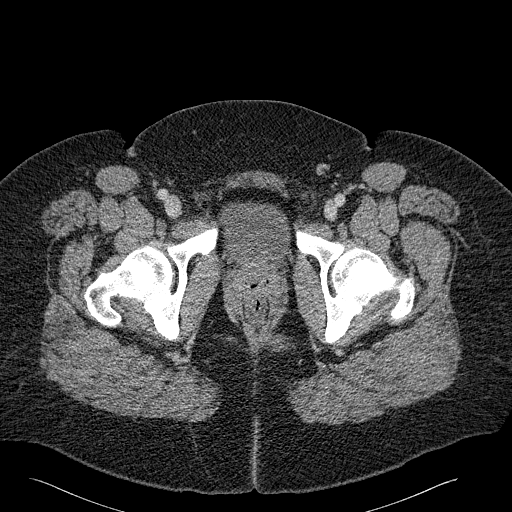
[im 146/726  soft-tissue]
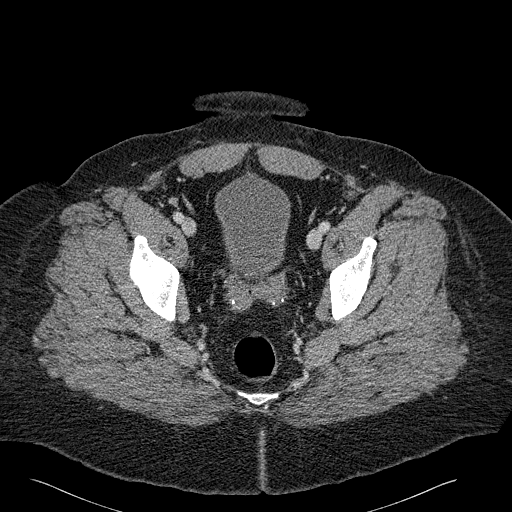
[im 204/726  soft-tissue]
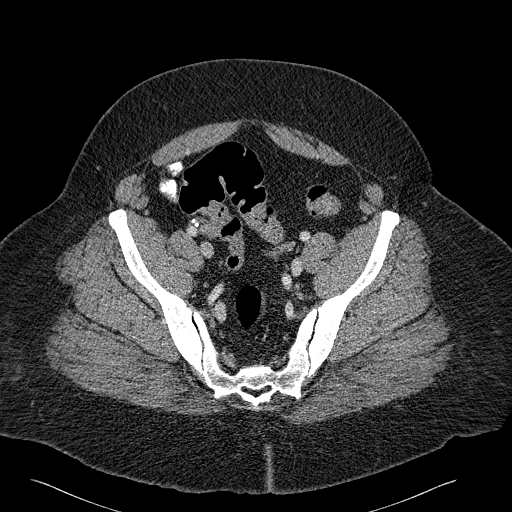
[im 233/726  soft-tissue]
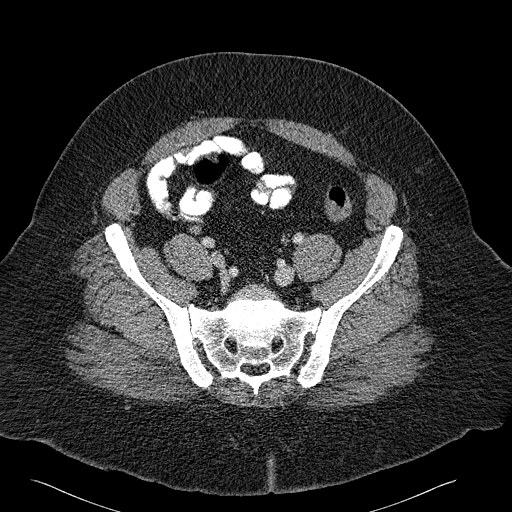
[im 291/726  soft-tissue]
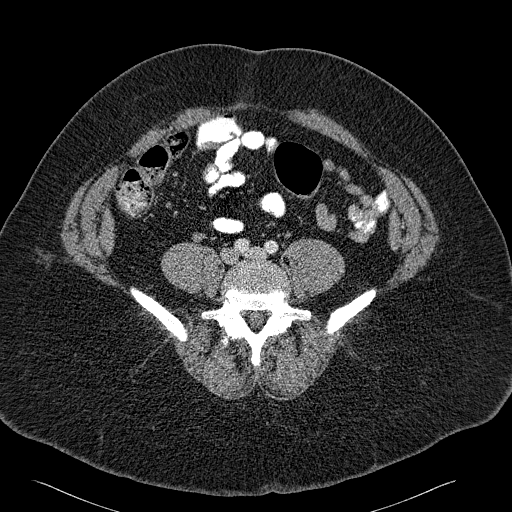
[im 349/726  soft-tissue]
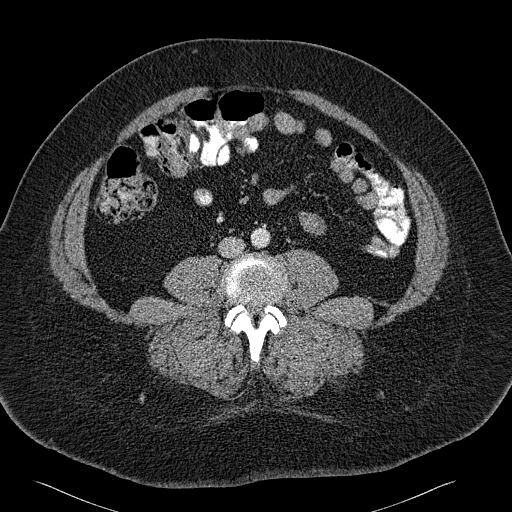
[im 378/726  soft-tissue]
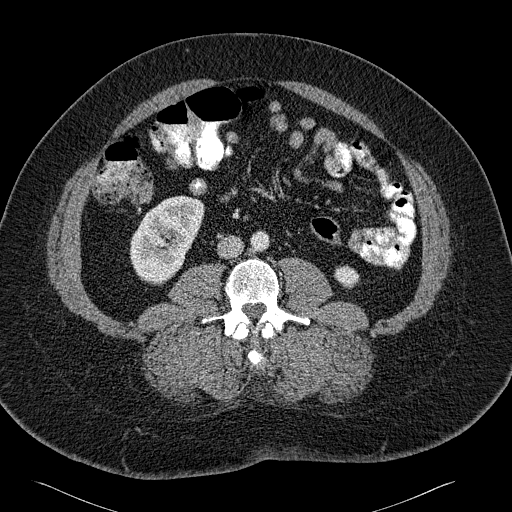
[im 436/726  soft-tissue]
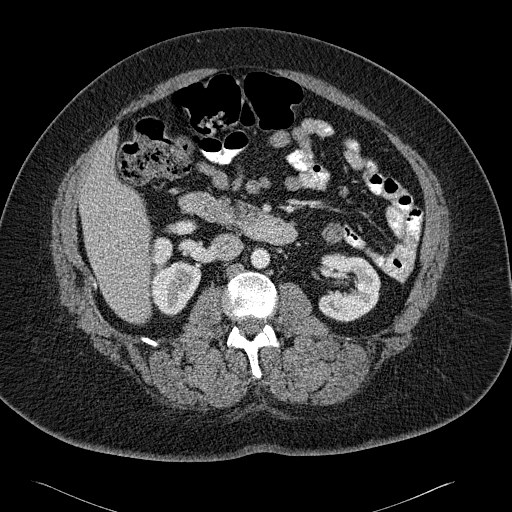
[im 436/726  bone]
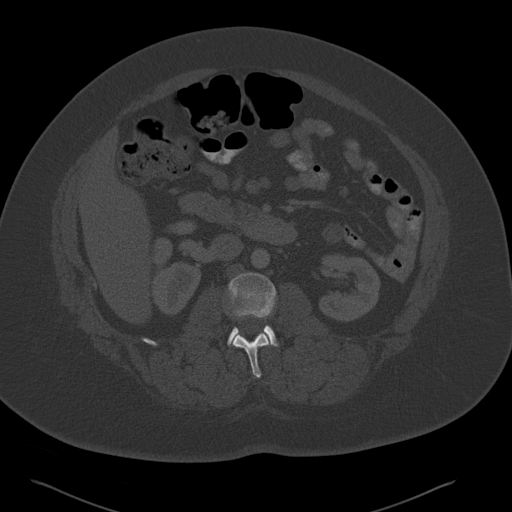
[im 494/726  soft-tissue]
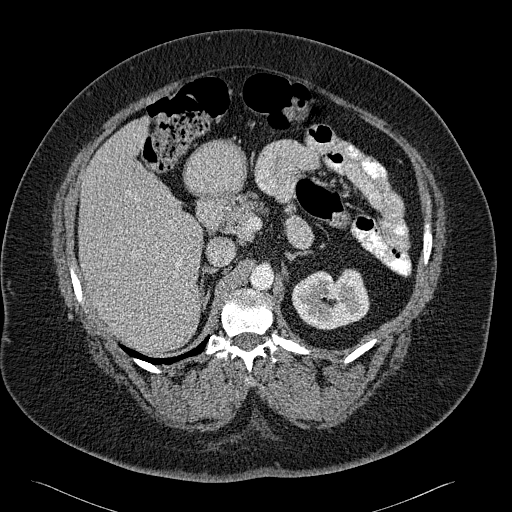
[im 552/726  soft-tissue]
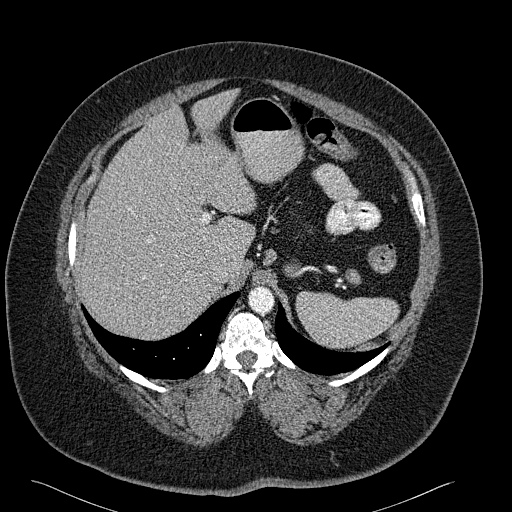
[im 581/726  soft-tissue]
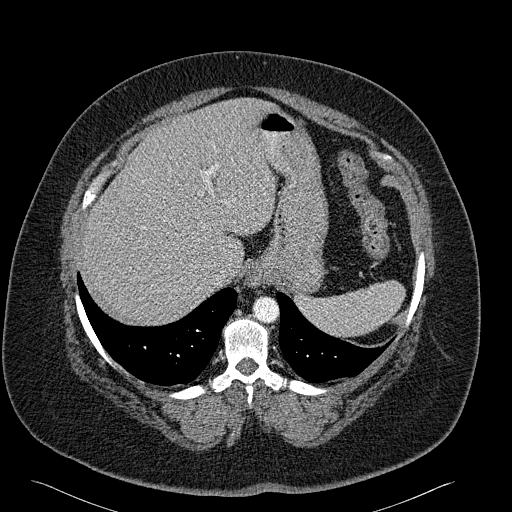
[im 639/726  soft-tissue]
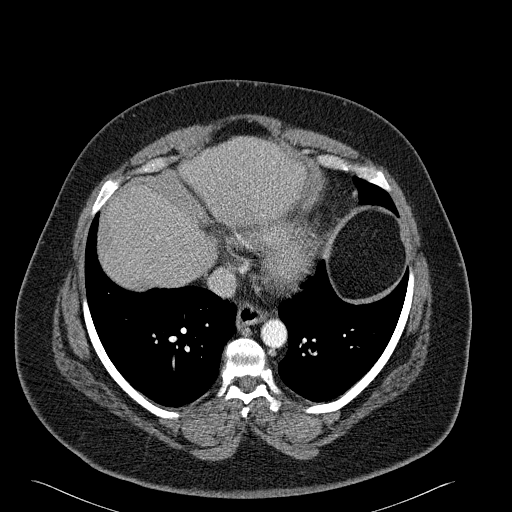
[im 697/726  soft-tissue]
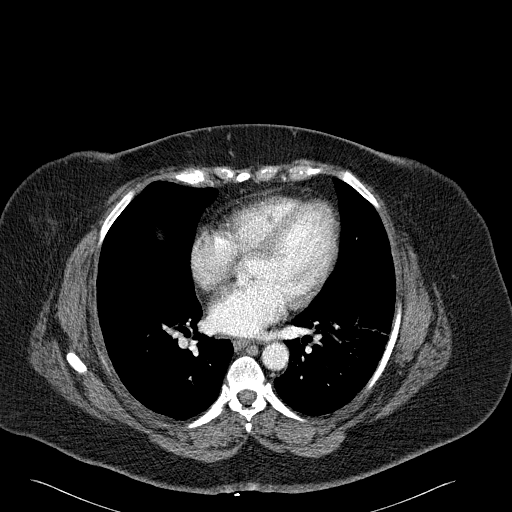

[Series 602: coronal abdomen · coronal · 0.99mm/px · 3 of 355 slices shown]
[im 119/355  soft-tissue]
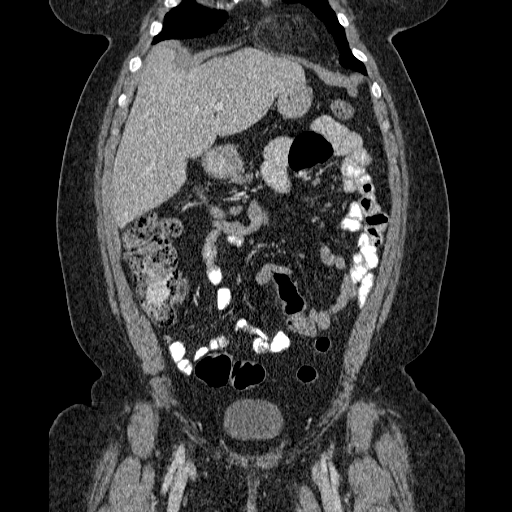
[im 158/355  soft-tissue]
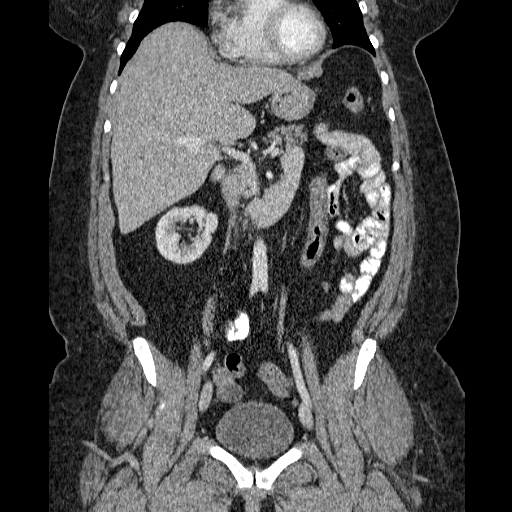
[im 197/355  soft-tissue]
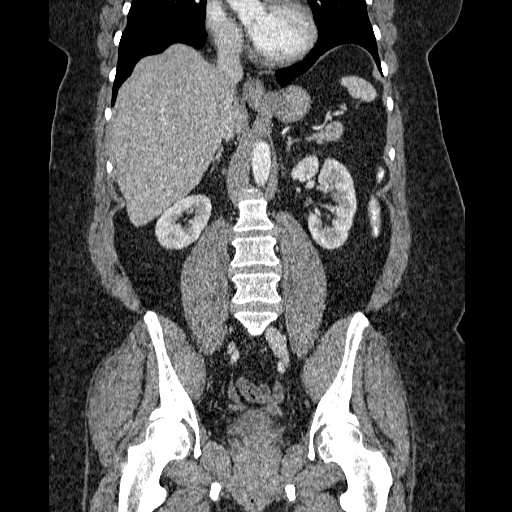

[17 of 46 positions shown; findings below may reference images not displayed]

FINDINGS: Lung bases clear.  Liver and spleen unremarkable.  Gallbladder removed.  No biliary ductal dilatation.  Pancreas normal.  Kidneys show no masses or hydronephrosis.  No perinephric fluid collections.  No inflammatory change or thickened bowel wall loops.  A moderate amount of stool is seen in the right colon.
IMPRESSION: 1.  Status post cholecystectomy.
 2.  Constipation.  
 PELVIS CT WITH CONTRAST:
FINDINGS: No evidence for appendicitis.  No umbilical hernia.  No significant free fluid in the pelvis.  Uterus surgically absent.  Bones unremarkable.
IMPRESSION: No acute pelvic pathology.

## 2006-06-29 ENCOUNTER — Ambulatory Visit (HOSPITAL_COMMUNITY): Admission: RE | Admit: 2006-06-29 | Discharge: 2006-06-29 | Payer: Self-pay | Admitting: Obstetrics and Gynecology

## 2006-06-29 ENCOUNTER — Ambulatory Visit: Payer: Self-pay | Admitting: Internal Medicine

## 2006-06-29 DIAGNOSIS — J069 Acute upper respiratory infection, unspecified: Secondary | ICD-10-CM | POA: Insufficient documentation

## 2006-07-31 ENCOUNTER — Telehealth: Payer: Self-pay | Admitting: *Deleted

## 2006-08-12 ENCOUNTER — Ambulatory Visit: Payer: Self-pay | Admitting: Internal Medicine

## 2006-08-25 ENCOUNTER — Telehealth: Payer: Self-pay | Admitting: *Deleted

## 2006-08-25 ENCOUNTER — Telehealth (INDEPENDENT_AMBULATORY_CARE_PROVIDER_SITE_OTHER): Payer: Self-pay | Admitting: *Deleted

## 2006-09-04 ENCOUNTER — Encounter (INDEPENDENT_AMBULATORY_CARE_PROVIDER_SITE_OTHER): Payer: Self-pay | Admitting: Internal Medicine

## 2006-09-07 ENCOUNTER — Telehealth (INDEPENDENT_AMBULATORY_CARE_PROVIDER_SITE_OTHER): Payer: Self-pay | Admitting: *Deleted

## 2006-09-09 IMAGING — CR DG KNEE 1-2V*R*
2 series · 2 of 2 positions shown · non-contrast
Comparison: none

CLINICAL DATA: Right knee pain for two months.
 RIGHT KNEE ? 2 VIEWS:

[view not recorded (1 of 2)]
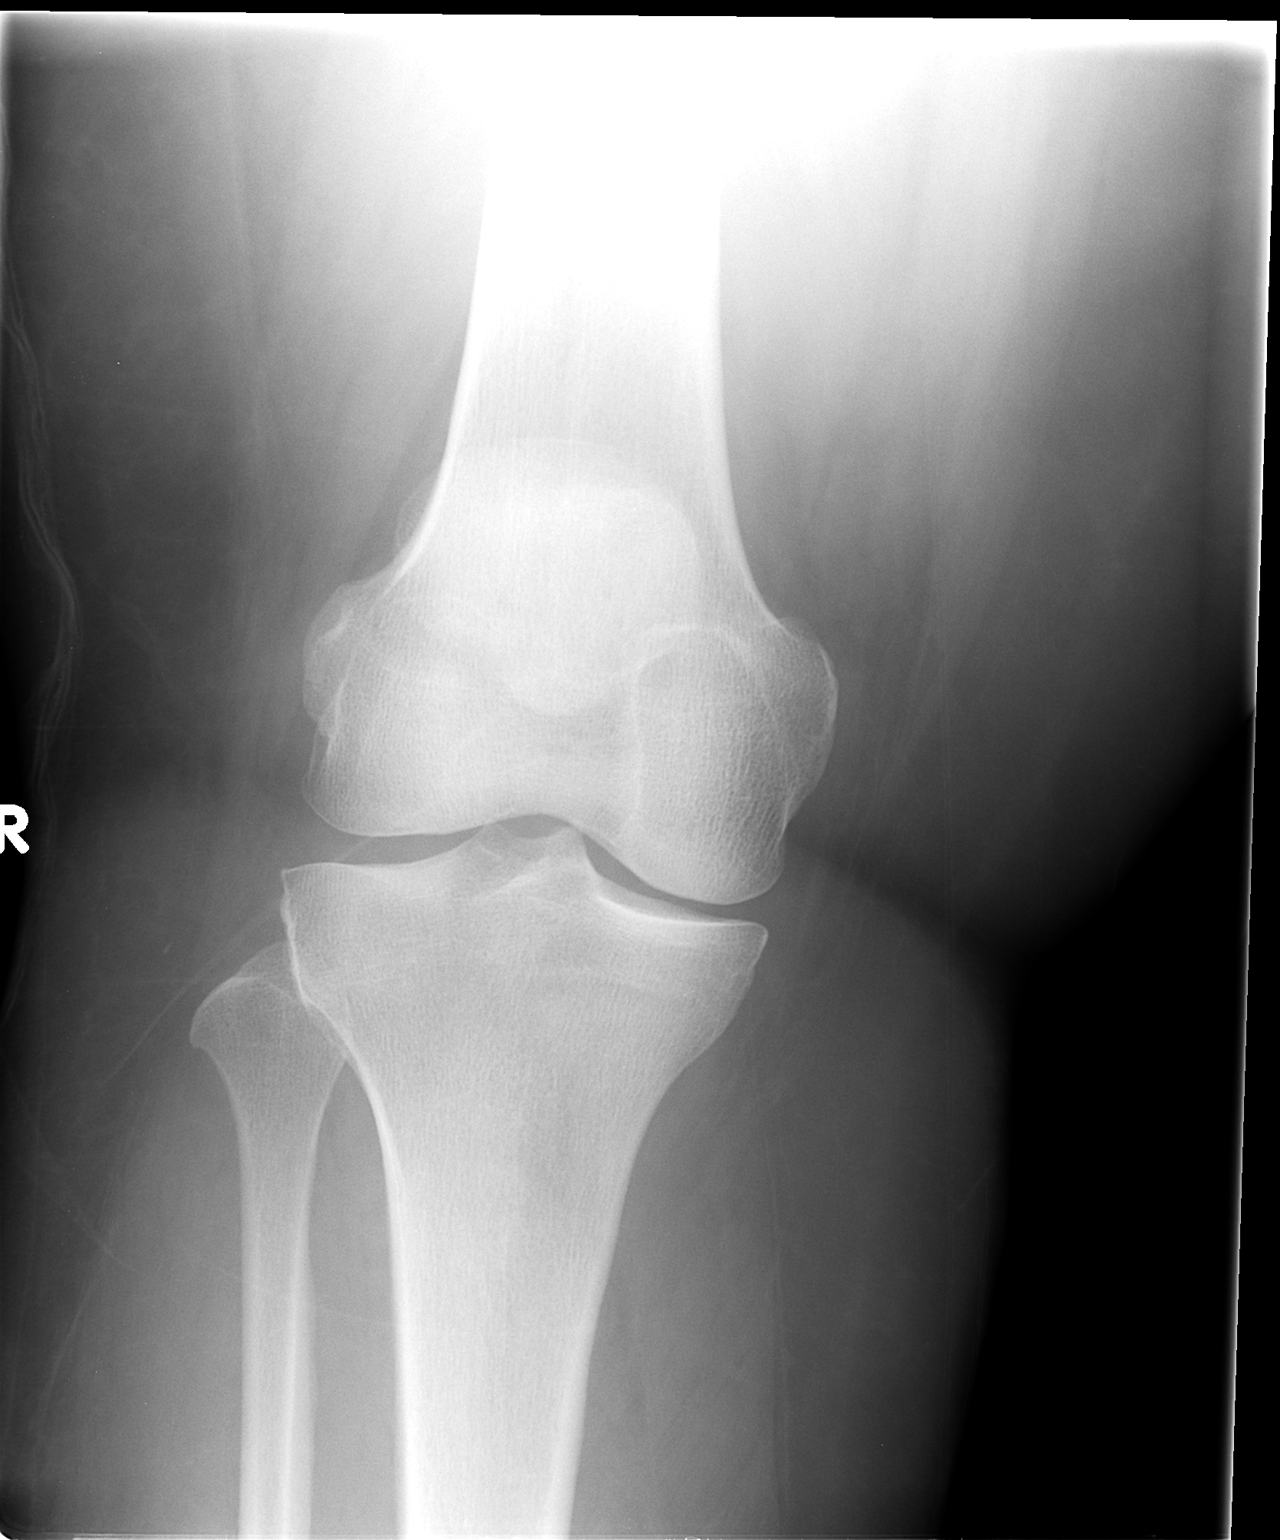

[view not recorded (2 of 2)]
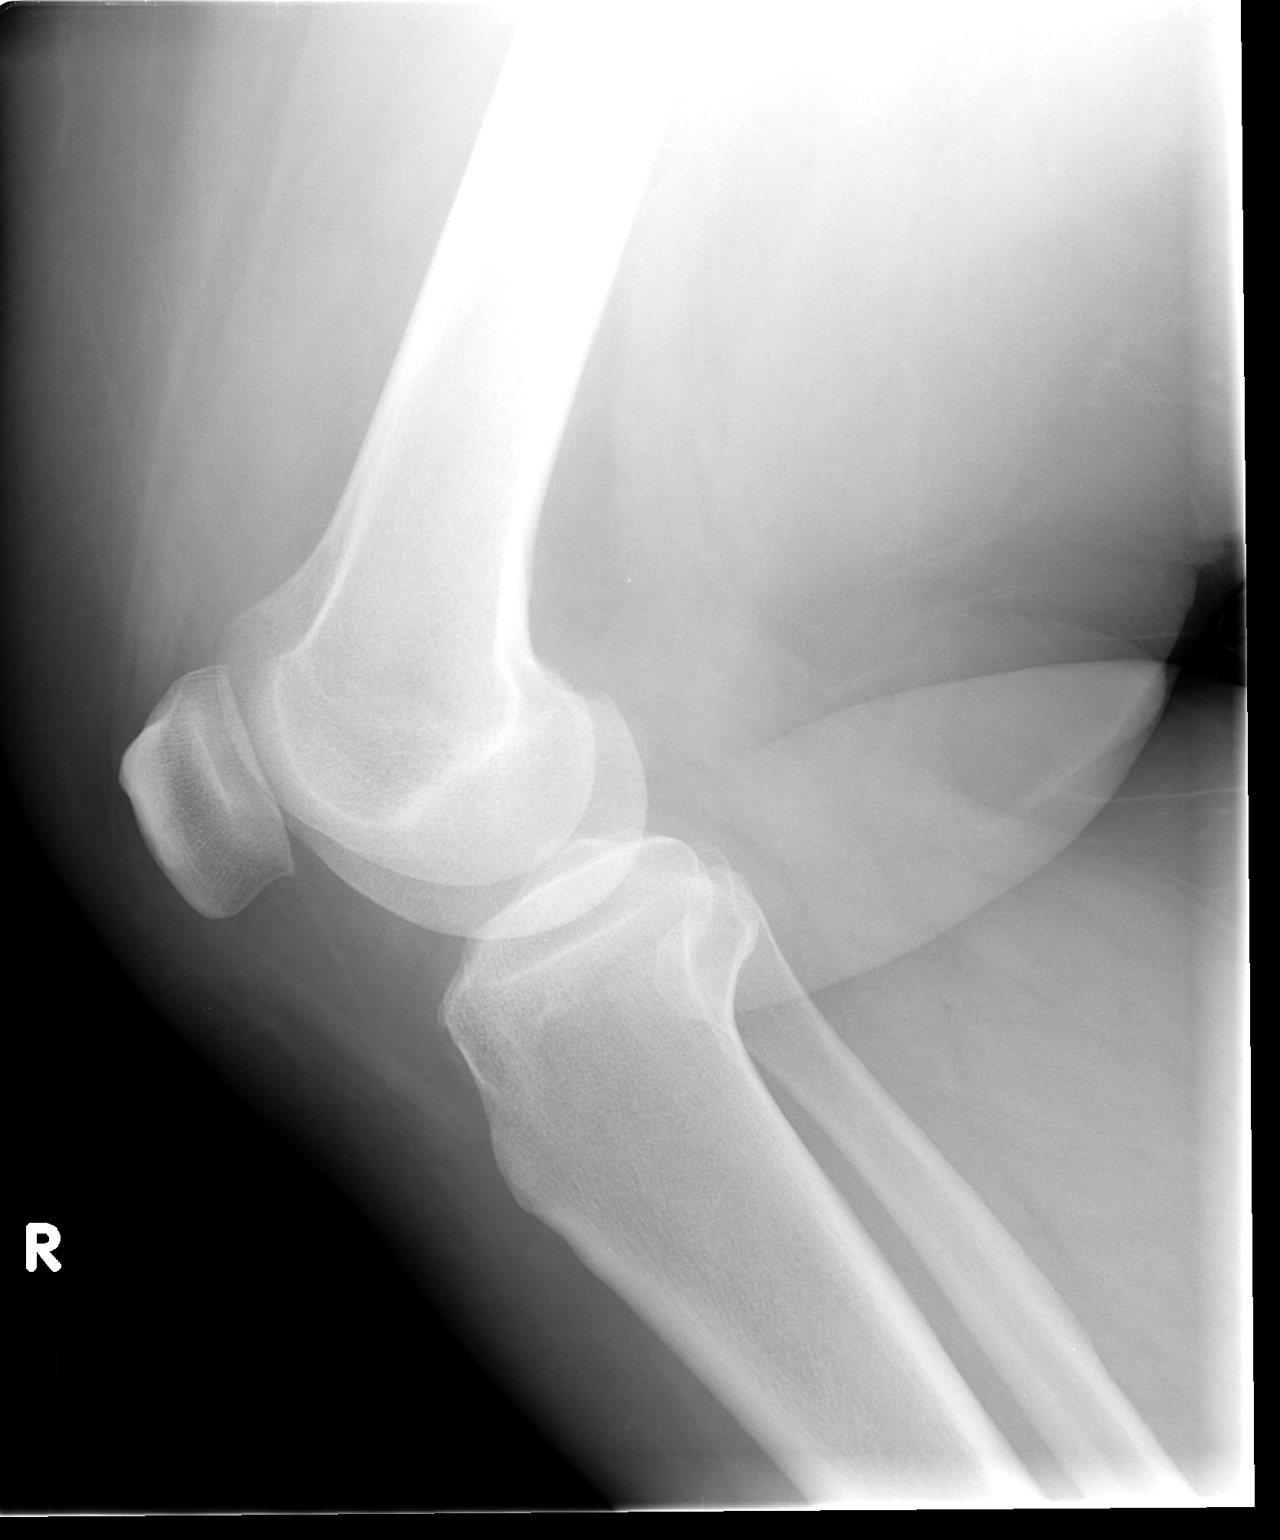

[2 of 2 positions shown; findings below may reference images not displayed]

FINDINGS: There is minimal osteophytic lipping at the articular margins of the lateral aspect of the tibia and patella.  The joint space is preserved.  No bony erosions and no definite joint effusion.
IMPRESSION: Minimal degenerative changes.  No acute abnormality.

## 2006-09-30 ENCOUNTER — Ambulatory Visit: Payer: Self-pay | Admitting: Infectious Disease

## 2006-10-15 ENCOUNTER — Encounter (INDEPENDENT_AMBULATORY_CARE_PROVIDER_SITE_OTHER): Payer: Self-pay | Admitting: Internal Medicine

## 2006-10-26 ENCOUNTER — Telehealth: Payer: Self-pay | Admitting: *Deleted

## 2006-11-17 ENCOUNTER — Telehealth (INDEPENDENT_AMBULATORY_CARE_PROVIDER_SITE_OTHER): Payer: Self-pay | Admitting: Internal Medicine

## 2006-11-26 ENCOUNTER — Ambulatory Visit: Payer: Self-pay | Admitting: *Deleted

## 2006-11-26 ENCOUNTER — Encounter (INDEPENDENT_AMBULATORY_CARE_PROVIDER_SITE_OTHER): Payer: Self-pay | Admitting: Infectious Diseases

## 2006-11-26 LAB — CONVERTED CEMR LAB
CO2: 22 meq/L (ref 19–32)
Glucose, Bld: 127 mg/dL — ABNORMAL HIGH (ref 70–99)
Potassium: 4.4 meq/L (ref 3.5–5.3)
Sodium: 137 meq/L (ref 135–145)

## 2006-11-28 ENCOUNTER — Encounter (INDEPENDENT_AMBULATORY_CARE_PROVIDER_SITE_OTHER): Payer: Self-pay | Admitting: Internal Medicine

## 2006-12-14 ENCOUNTER — Telehealth (INDEPENDENT_AMBULATORY_CARE_PROVIDER_SITE_OTHER): Payer: Self-pay | Admitting: Internal Medicine

## 2006-12-15 ENCOUNTER — Telehealth (INDEPENDENT_AMBULATORY_CARE_PROVIDER_SITE_OTHER): Payer: Self-pay | Admitting: Internal Medicine

## 2006-12-16 ENCOUNTER — Telehealth (INDEPENDENT_AMBULATORY_CARE_PROVIDER_SITE_OTHER): Payer: Self-pay | Admitting: Internal Medicine

## 2006-12-16 IMAGING — MG MM DIAGNOSTIC BILATERAL
6 series · 6 of 6 positions shown · non-contrast
Comparison: 03-18-99.

DG DIAGNOSTIC BILATERAL
Bilateral CC and MLO view(s) were taken.

LEFT BREAST ULTRASOUND
DIGITAL BILATERAL DIAGNOSTIC MAMMOGRAM WITH CAD AND LEFT BREAST ULTRASOUND:
CLINICAL DATA: 50-year-old female with lump in the left upper outer breast.  History of trauma 
one month ago.

[R CC]
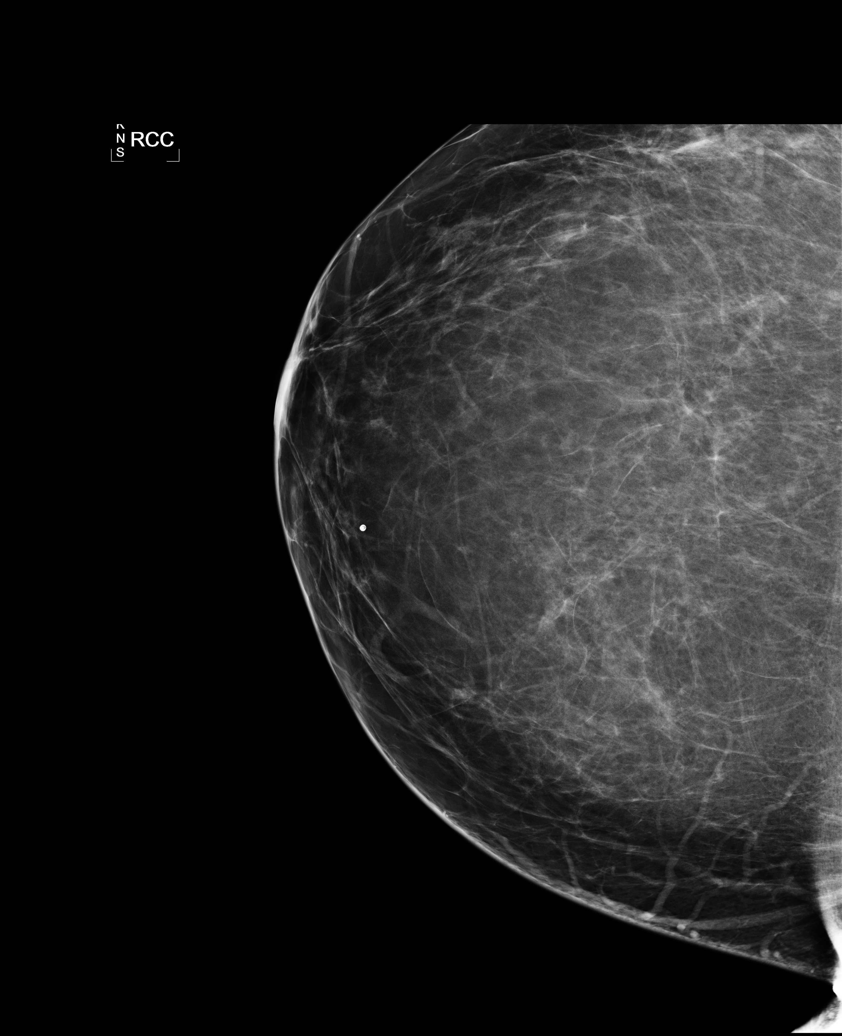

[L CC]
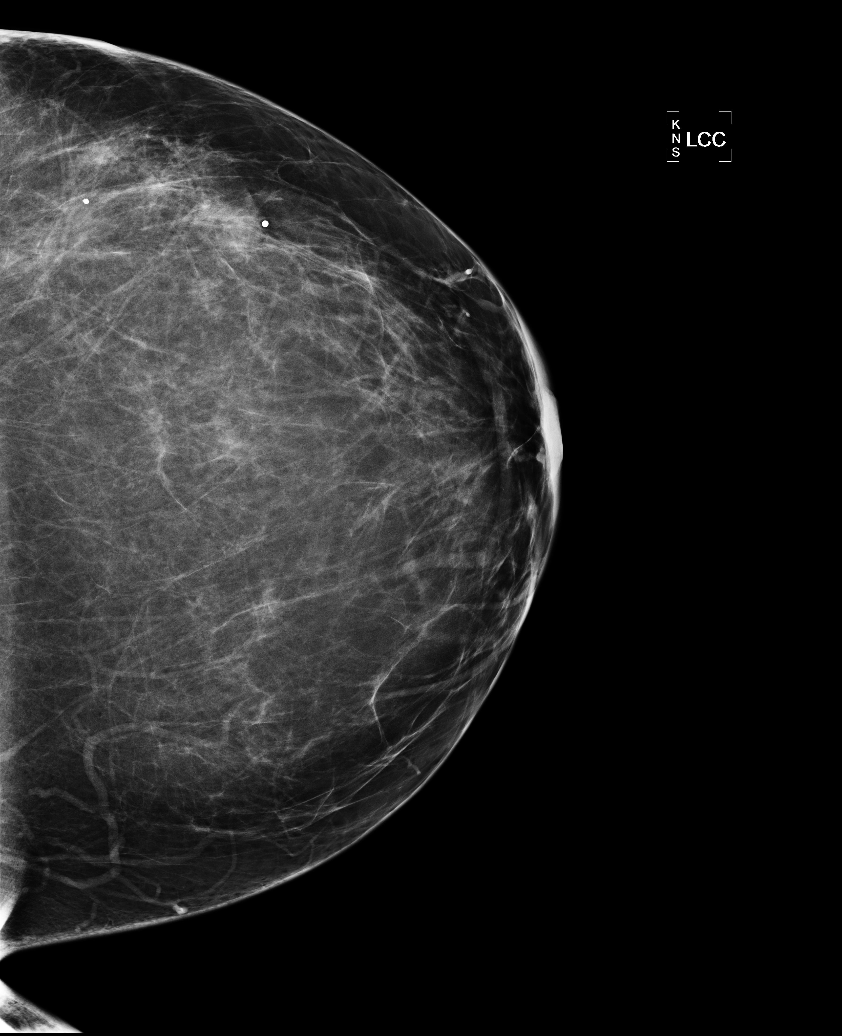

[L MLO]
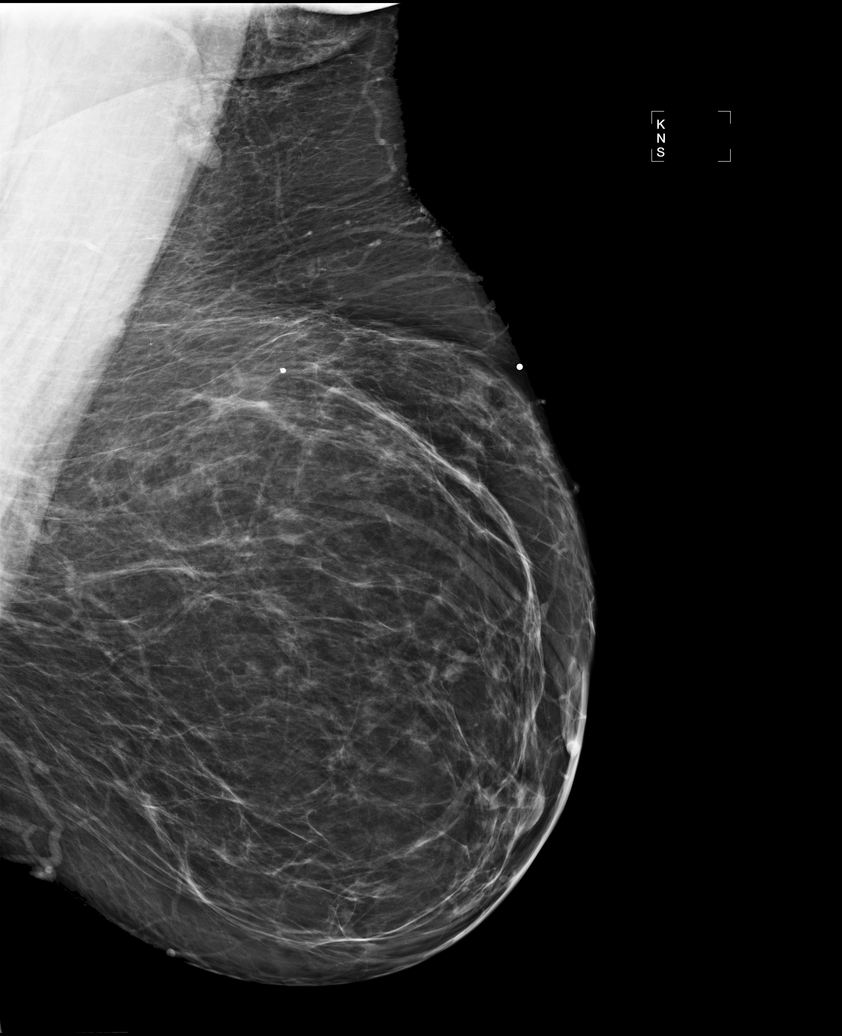

[R MLO]
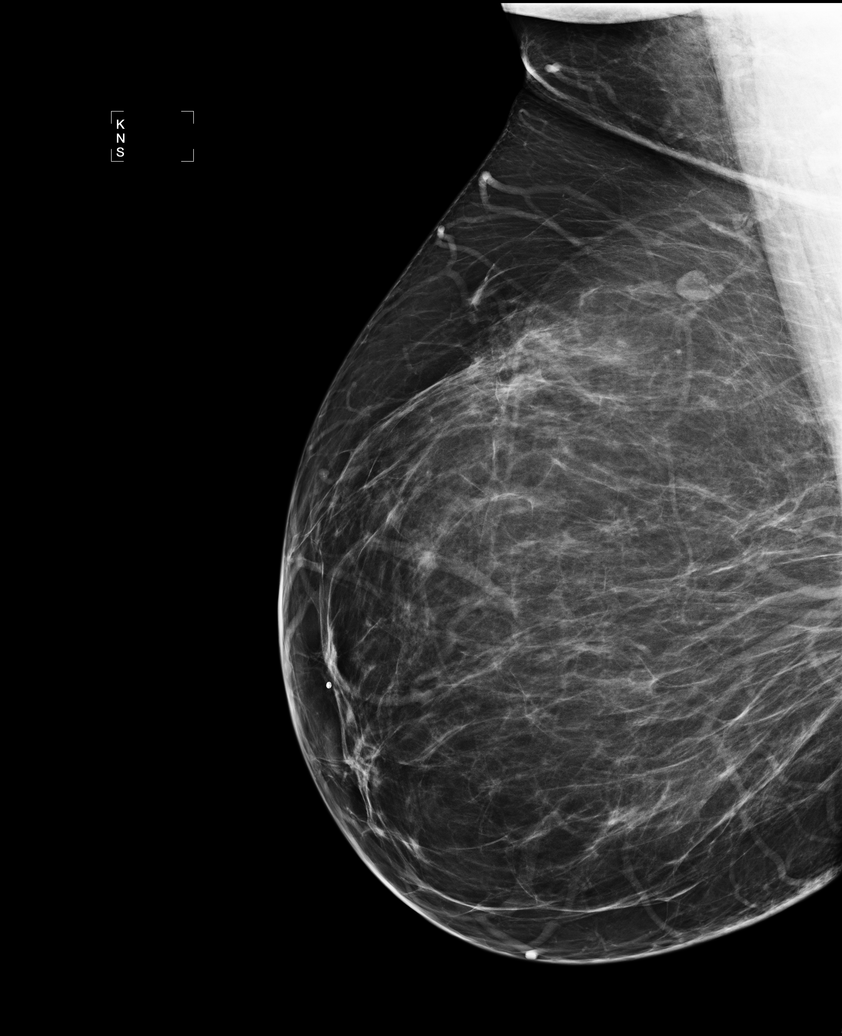

[L TAN (1 of 2)]
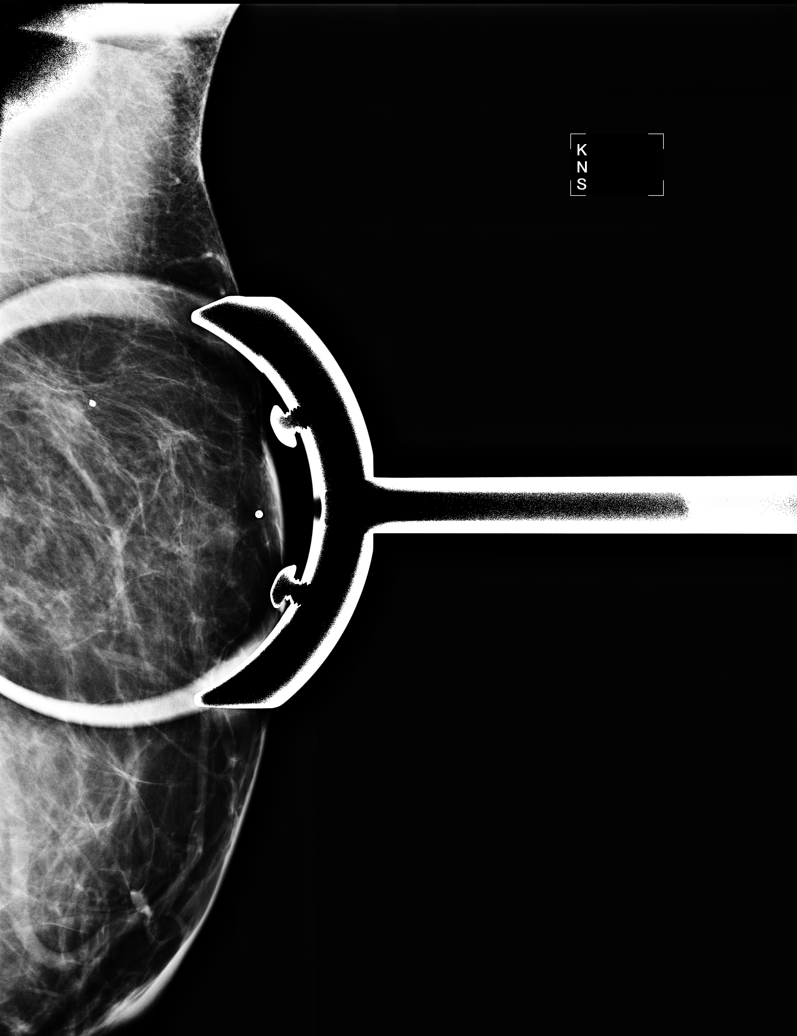

[L TAN (2 of 2)]
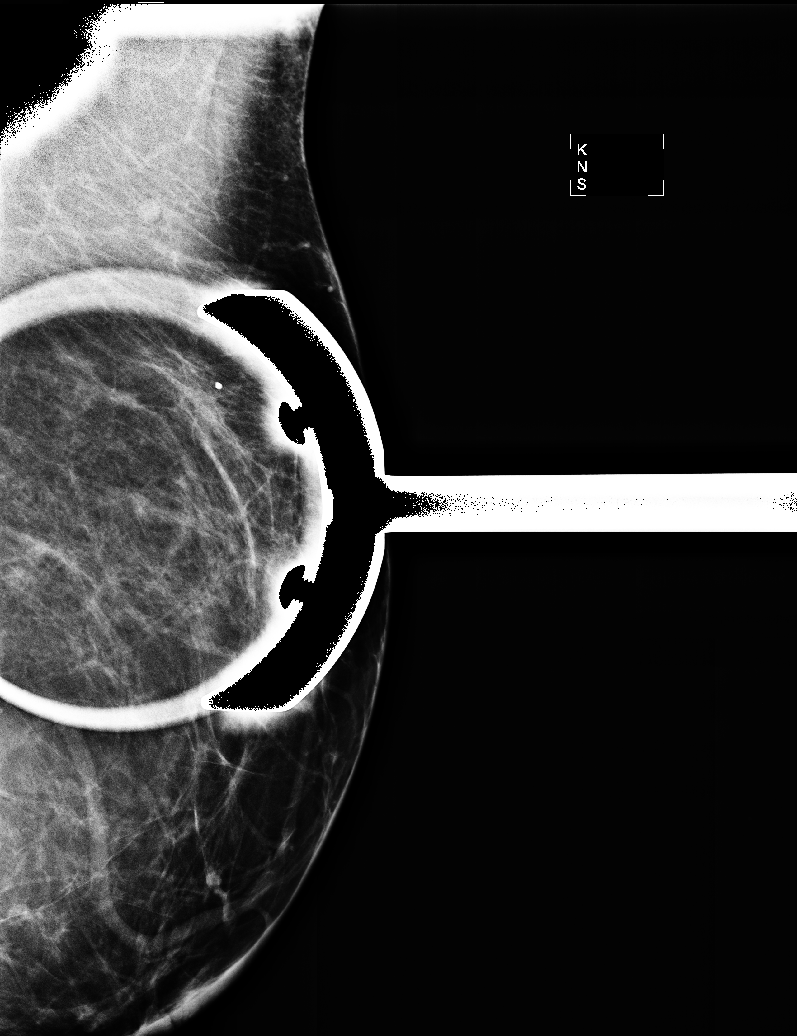

[6 of 6 positions shown; findings below may reference images not displayed]

There is no evidence of mass, architectural distortion or suspicious microcalcifications to suggest
malignancy.  No abnormality underlies the BB in the left upper outer breast corresponding to the 
patient's palpable abnormality.

On physical examination, a firm 2 cm area in the left upper outer breast in the 10 o'clock position
9 cm from the nipple is identified.

On targeted ultrasound evaluation of the left upper outer breast, a 1.5 cm focal hyperechoic area 
with some cystic spaces is identified, compatible with area of fat necrosis.  No other masses or 
microcalcifications identified.
IMPRESSION: Likely fat necrosis within the left upper outer breast. Recommend left breast ultrasound follow-up 
in six months.

ASSESSMENT: Probably benign - BI-RADS 3

Ultrasound of the left breast in 6 months.
,

## 2006-12-18 ENCOUNTER — Telehealth (INDEPENDENT_AMBULATORY_CARE_PROVIDER_SITE_OTHER): Payer: Self-pay | Admitting: Internal Medicine

## 2007-01-04 ENCOUNTER — Telehealth (INDEPENDENT_AMBULATORY_CARE_PROVIDER_SITE_OTHER): Payer: Self-pay | Admitting: *Deleted

## 2007-01-19 ENCOUNTER — Telehealth (INDEPENDENT_AMBULATORY_CARE_PROVIDER_SITE_OTHER): Payer: Self-pay | Admitting: Internal Medicine

## 2007-01-25 IMAGING — CR DG ABDOMEN 2V
3 series · 3 of 3 positions shown · non-contrast
Comparison: none

CLINICAL DATA: Abdominal pain, nausea, vomiting, diarrhea.  History of previous gallbladder surgery and pancreatitis. 
 ABDOMEN ? 2 VIEW:

[w abdomen upright]
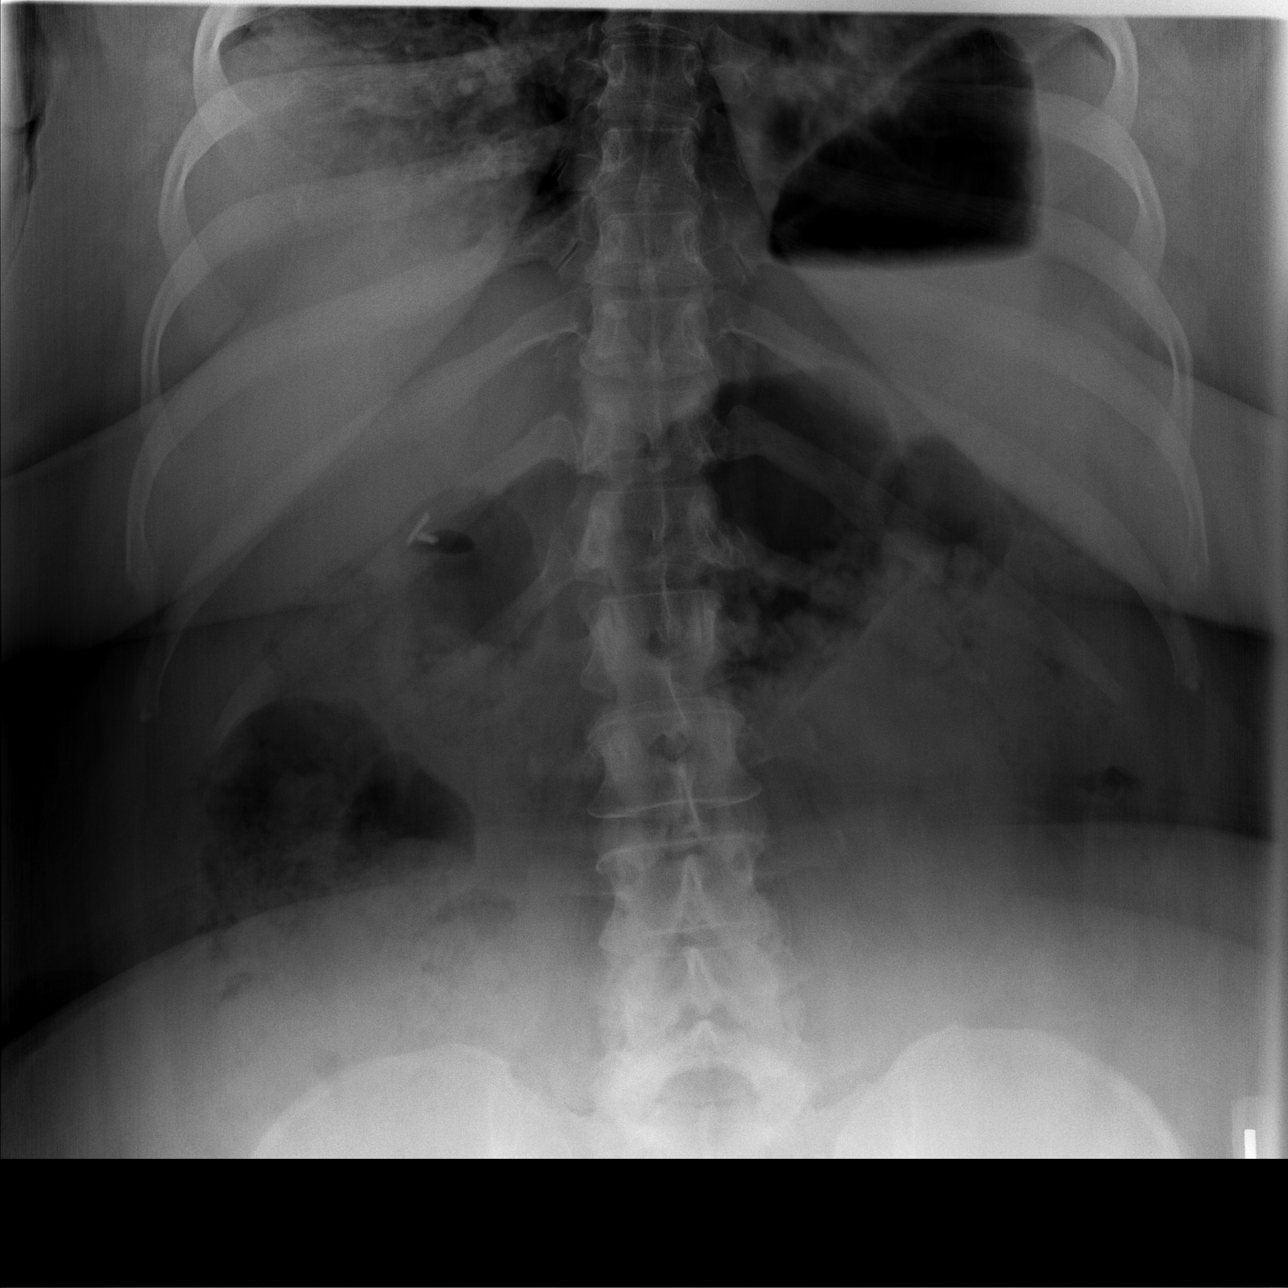

[t abdomen supine (1 of 2)]
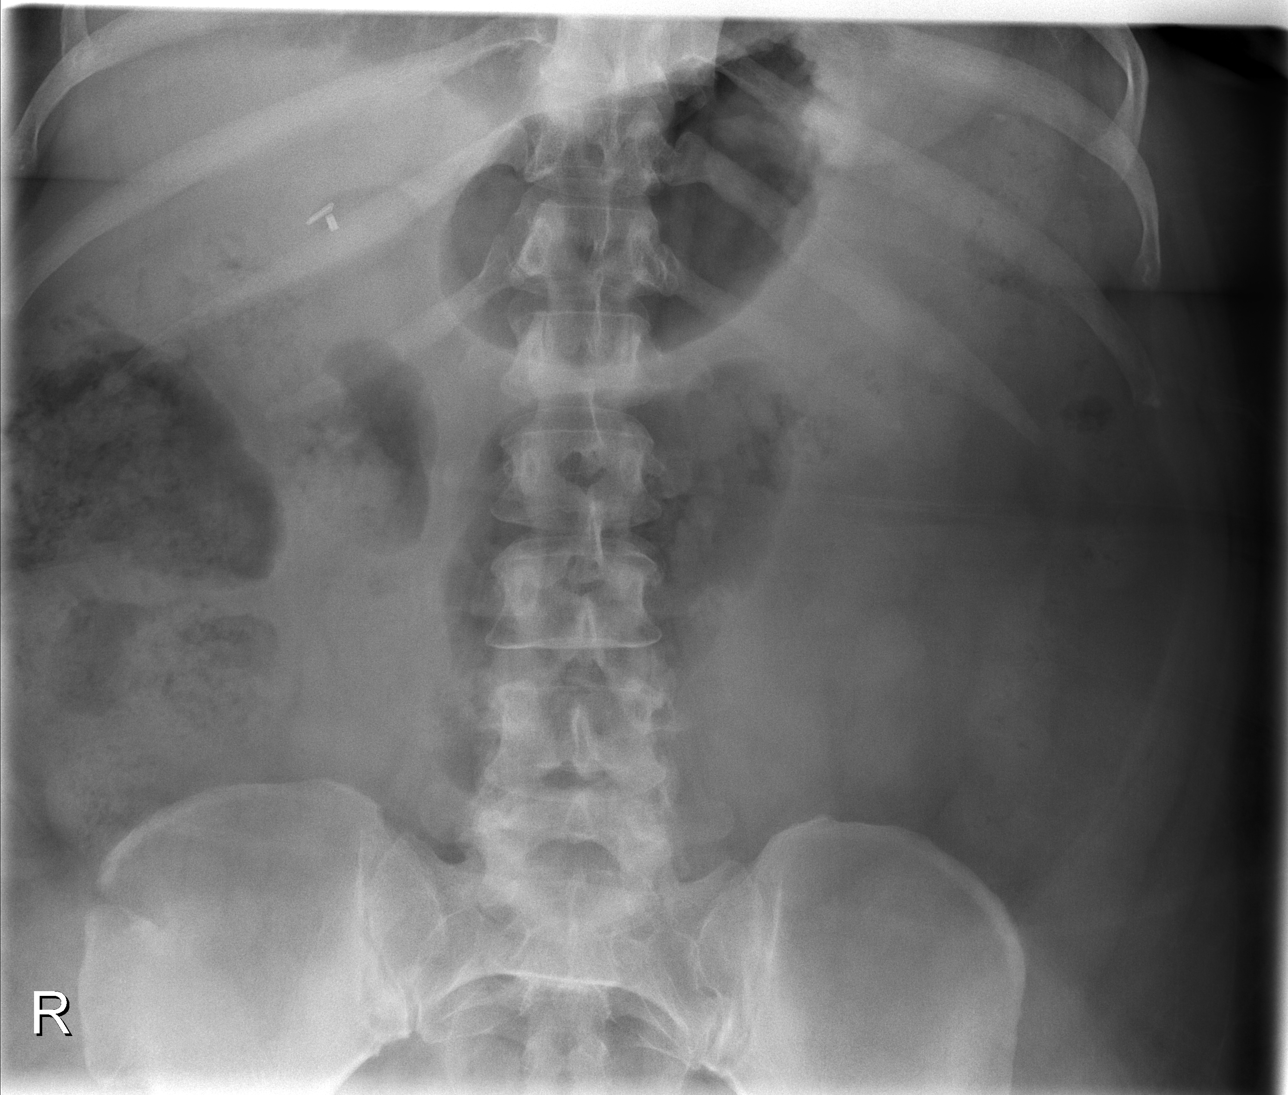

[t abdomen supine (2 of 2)]
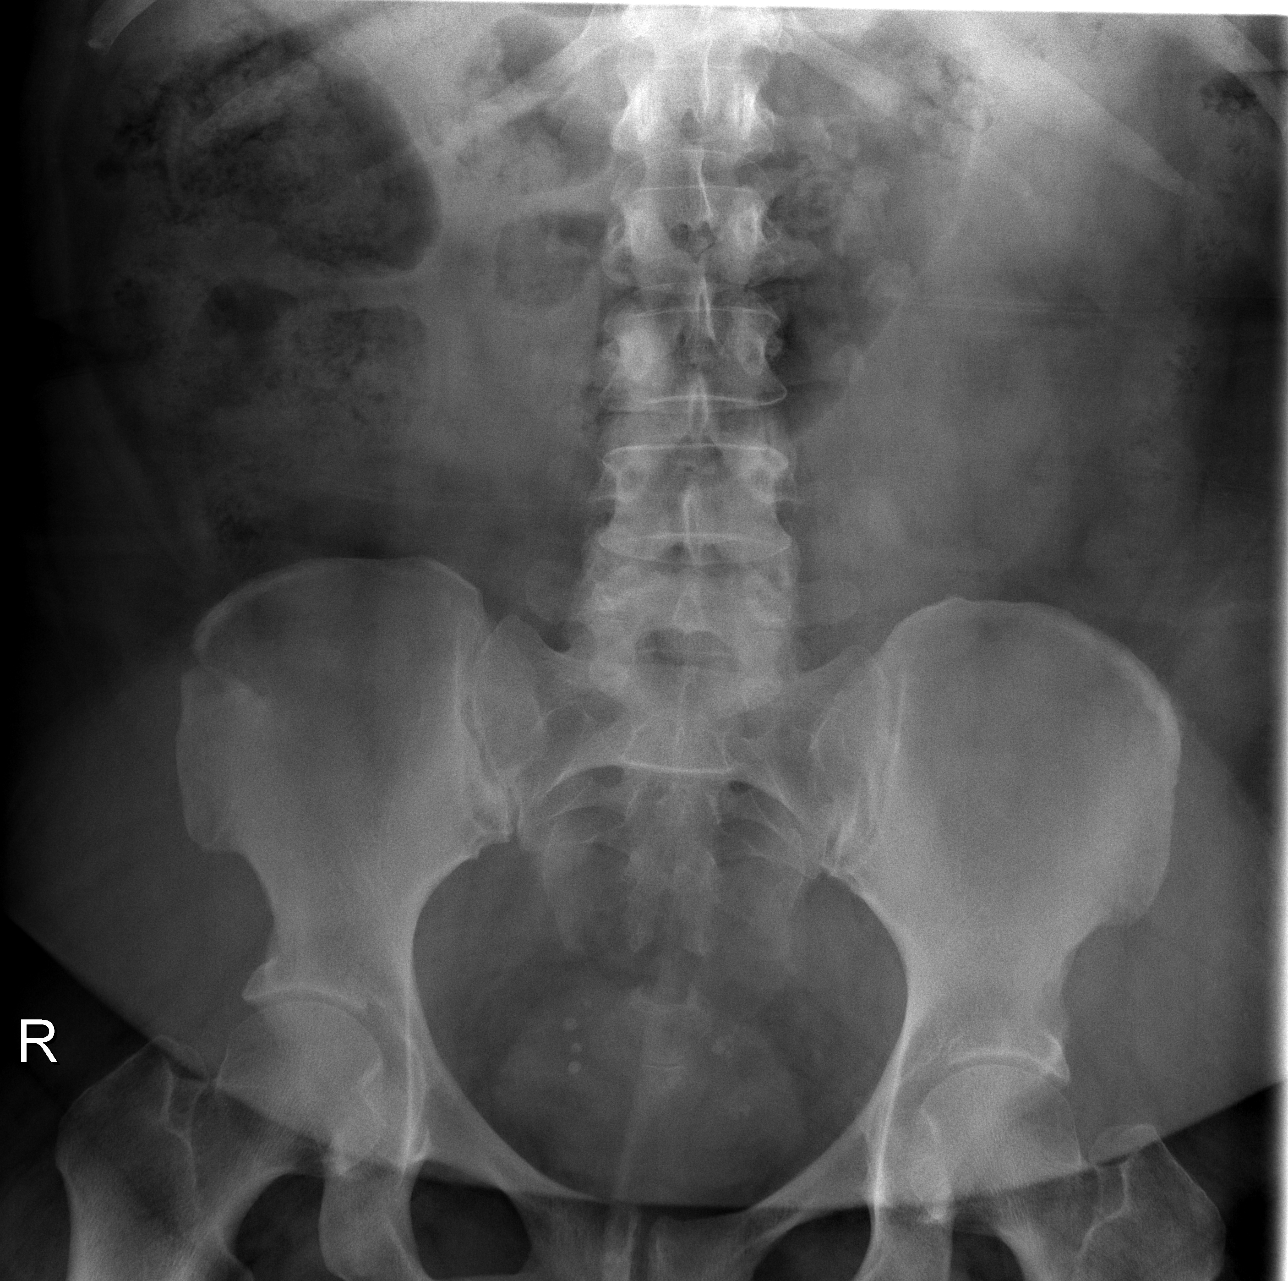

[3 of 3 positions shown; findings below may reference images not displayed]

FINDINGS: Flat and erect films of the abdomen are made and show a moderate amount of fecal material within the right colon.   There appears to be some gaseous distention of the transverse colon.   There are metallic clips consistent with the previous cholecystectomy.   There is no evidence of obstruction or perforation of the bowel.   There appears to be an air fluid level within the stomach.   There is questionable atelectasis and/or infiltrate at both lung bases.   I cannot see the diaphragm well on either side.
 The spine and bones of the pelvis and lower ribs appear normal.
IMPRESSION: 1.  No definite obstruction or perforation of the bowel.  Air fluid level within the stomach.  Considerable fecal material right colon.  Some gaseous distention transverse colon.  No obstruction of the small bowel. 
 2.  Questionable bilateral basilar atelectasis in the lungs.  Possible infiltrates at the bases.

## 2007-01-26 IMAGING — US US ABDOMEN COMPLETE
1 series · 13 of 25 positions shown · non-contrast
Comparison: none

CLINICAL DATA: Pancreatitis.  Abdominal pain.   Prior cholecystectomy.
 ABDOMEN ULTRASOUND:
TECHNIQUE: Complete abdominal ultrasound examination was performed including evaluation of the liver, gallbladder, bile ducts, pancreas, kidneys, spleen, IVC, and abdominal aorta.

[Series 1: unknown · 0.33mm/px · 13 of 50 slices shown]
[im 1/50]
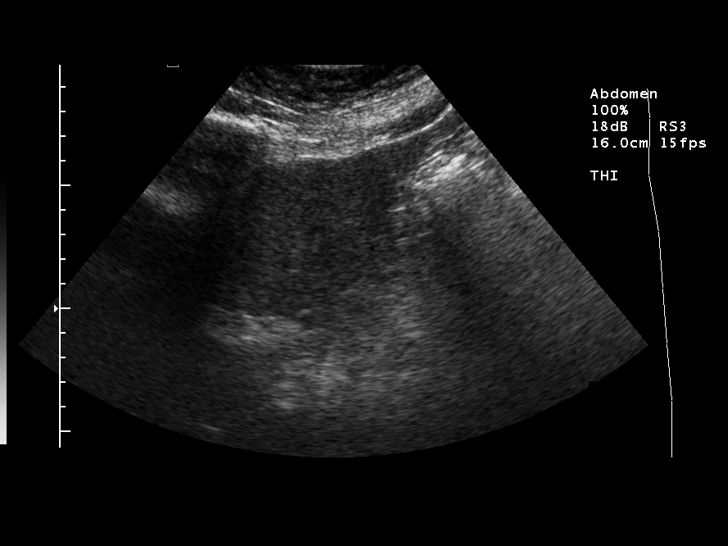
[im 5/50]
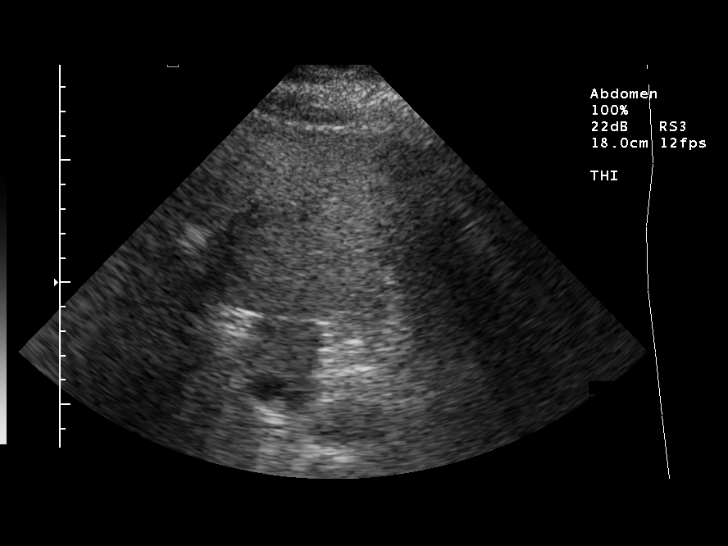
[im 9/50]
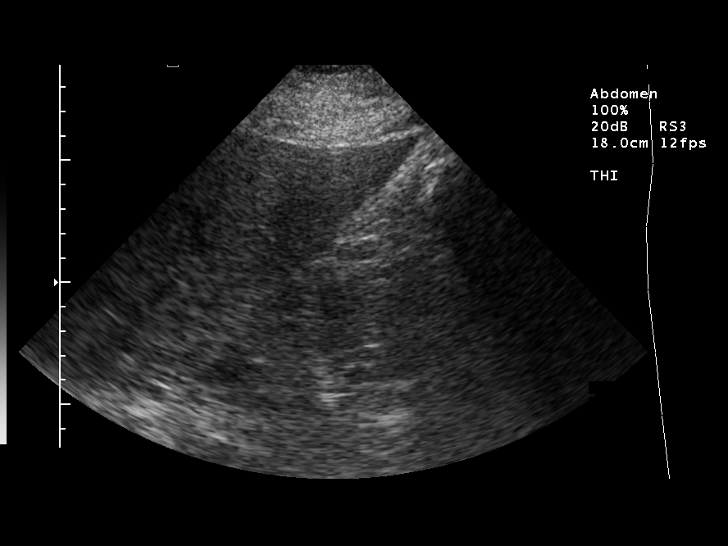
[im 13/50]
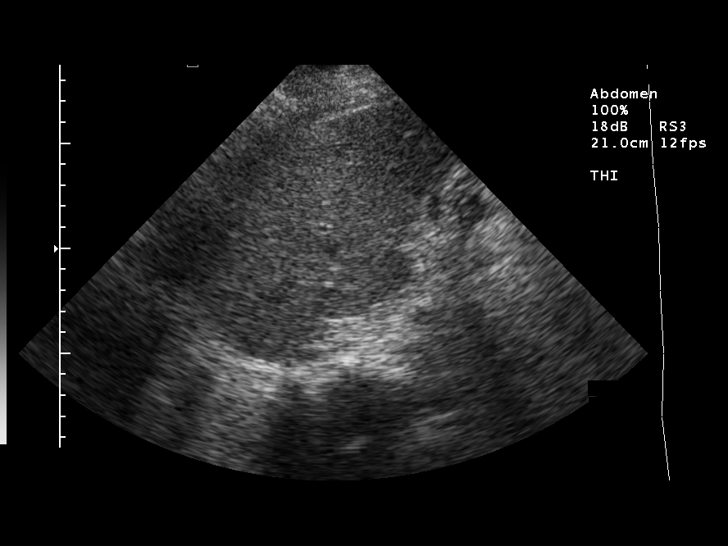
[im 17/50]
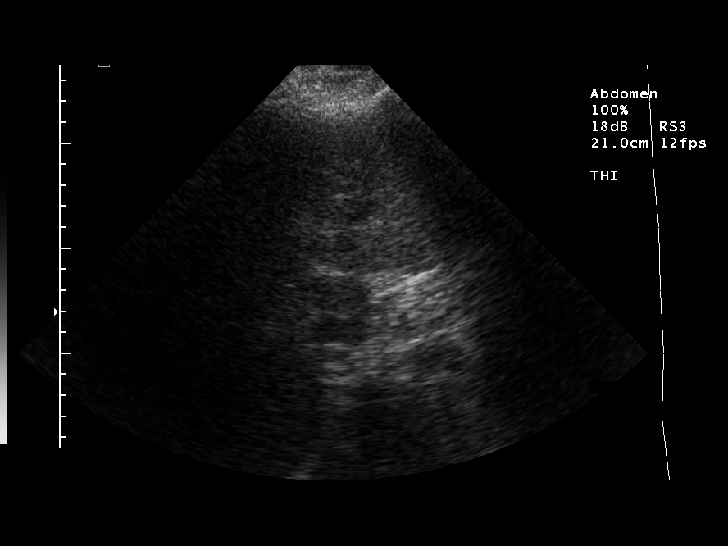
[im 21/50]
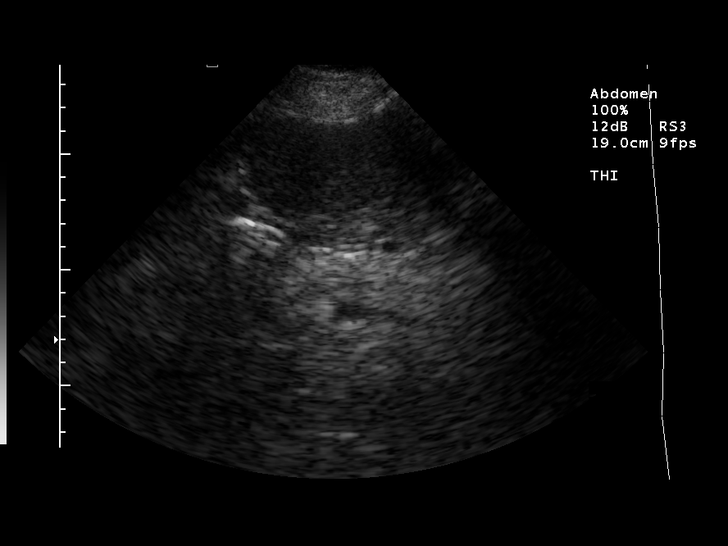
[im 25/50]
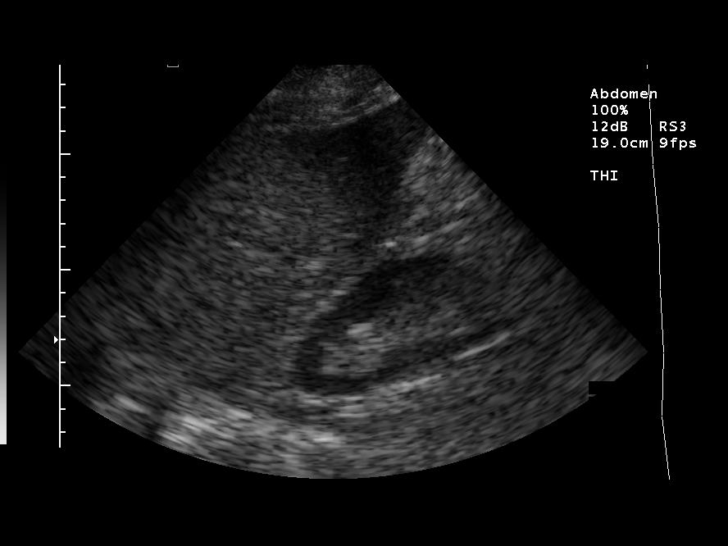
[im 29/50]
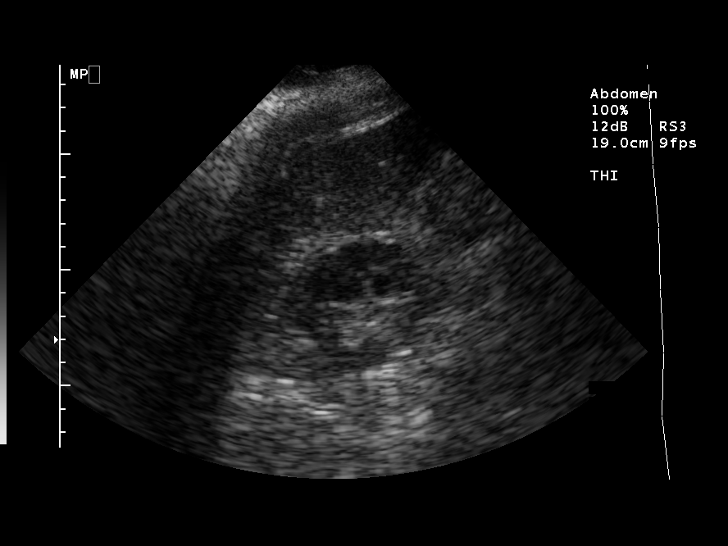
[im 33/50]
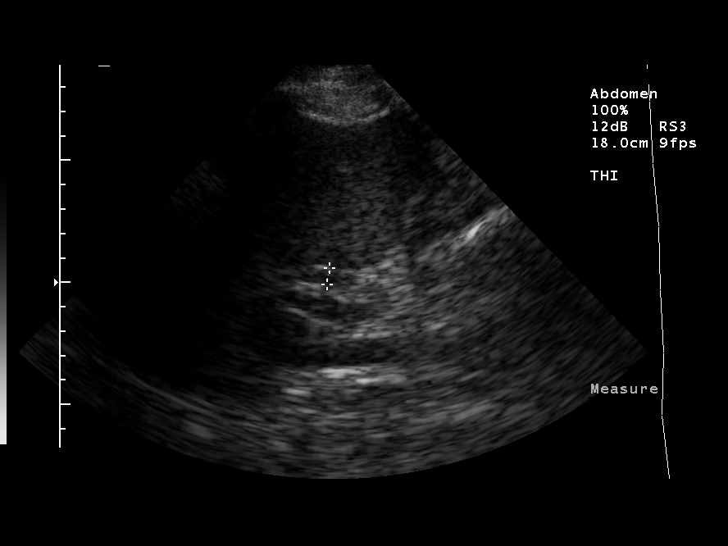
[im 37/50]
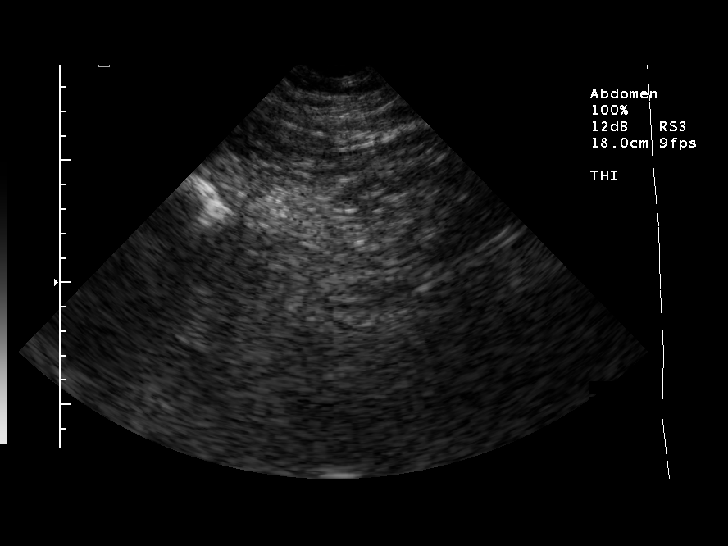
[im 41/50]
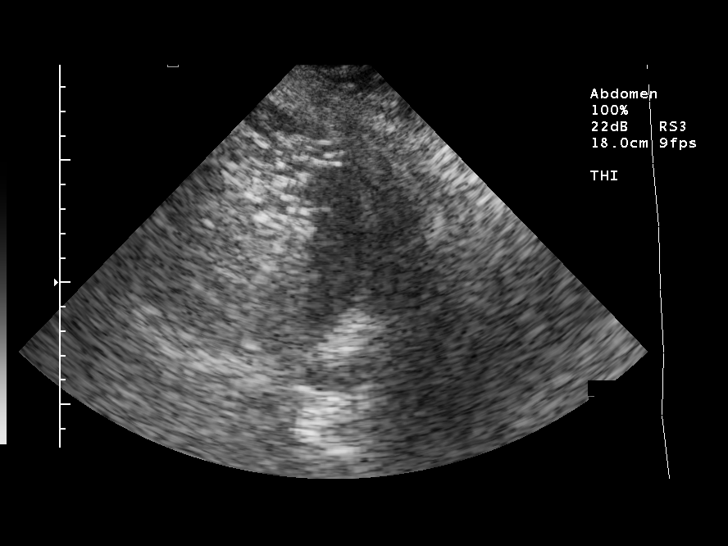
[im 45/50]
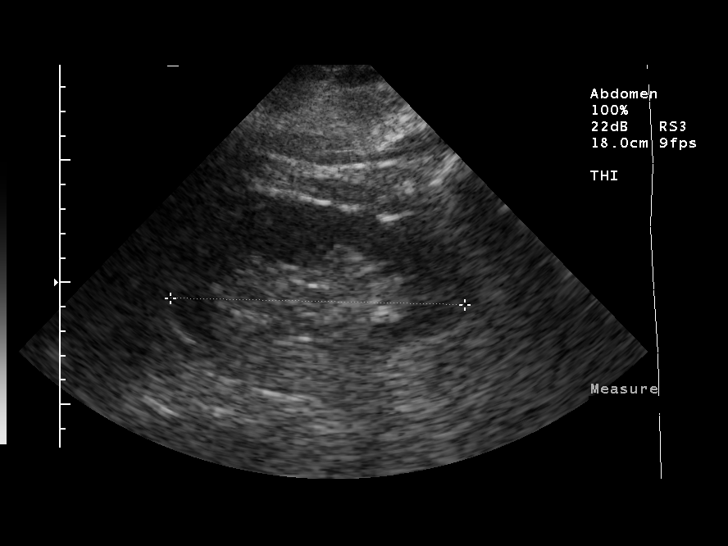
[im 50/50]
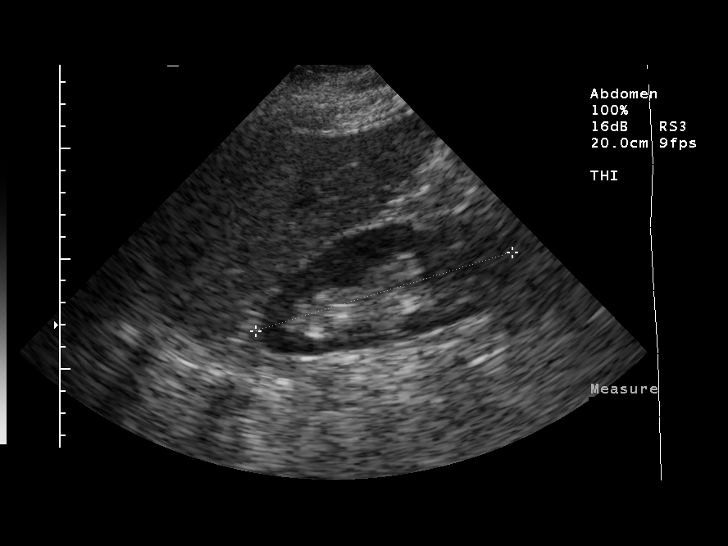

[13 of 25 positions shown; findings below may reference images not displayed]

FINDINGS: The gallbladder is surgically absent.  The common bile duct measures 7 mm which is within normal limits status post cholecystectomy.  No dilatation of intrahepatic bile ducts is seen.  The liver shows mildly increased echogenicity suspicious for mild diffuse fatty infiltration of the liver or other diffuse hepatocellular disease.  No focal liver masses are identified.  The visualized portion of the IVC is unremarkable.
 The pancreas is not well visualized due to overlying bowel gas.  There is no evidence of splenomegaly.  Both kidneys are normal in size and appearance and there is no evidence of hydronephrosis.  The visualized portion of the proximal abdominal aorta is nondilated.
IMPRESSION: 1.  Prior cholecystectomy.  No evidence of biliary ductal dilatation or other acute findings.
 2.  Probable mild diffuse fatty infiltration of the liver or other diffuse hepatocellular disease.  Correlation with liver function tests may be helpful.

## 2007-01-31 IMAGING — CT CT ABDOMEN W/ CM
2 of 5 series · 17 of 46 positions shown, 19 images · IV contrast (OMNI 350 25 ML & [ID] OMNI 300)
Comparison: Ultrasound of 07/31/05 and CT 12/29/04.

CLINICAL DATA: 50-year-old female with pancreatitis.  Rule out abscess or pseudocyst.   Upper abdominal pain since 07/30/05.
ABDOMEN CT WITH CONTRAST:
TECHNIQUE: Multidetector CT imaging of the abdomen was performed following the standard protocol during bolus administration of intravenous contrast.
Contrast:  100 mL Omnipaque 300.

[Series 2: abdomen · axial · 0.98mm/px · z∈[-308,-38]mm · 14 of 62 slices shown, 16 images]
[im 4/62  soft-tissue]
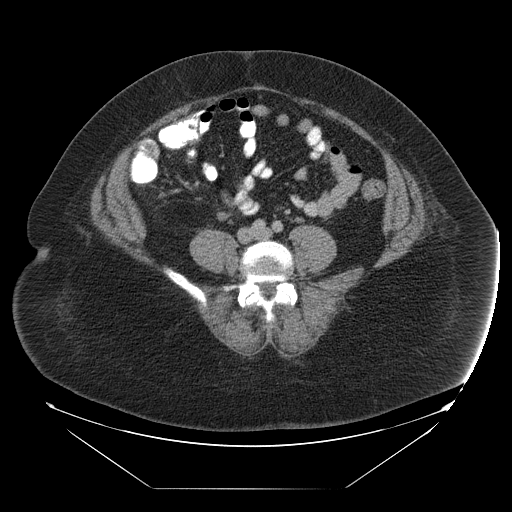
[im 4/62  bone]
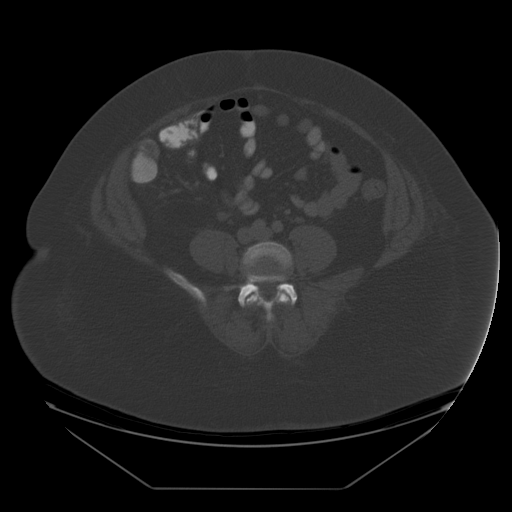
[im 8/62  soft-tissue]
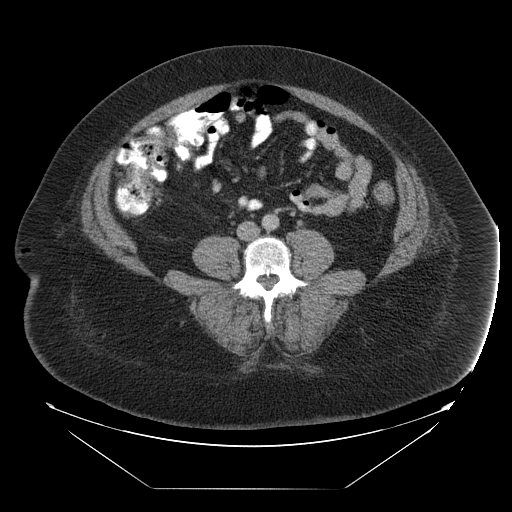
[im 12/62  soft-tissue]
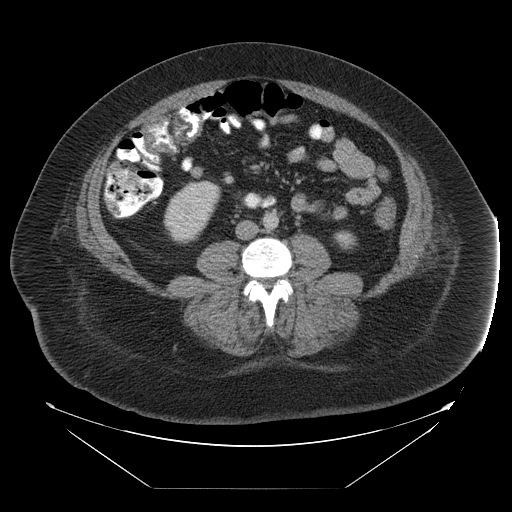
[im 16/62  soft-tissue]
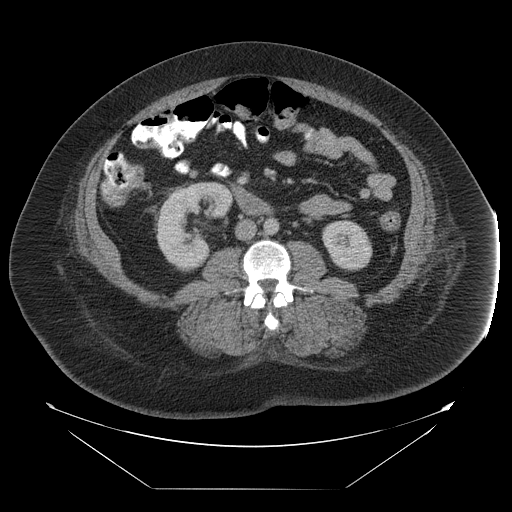
[im 20/62  soft-tissue]
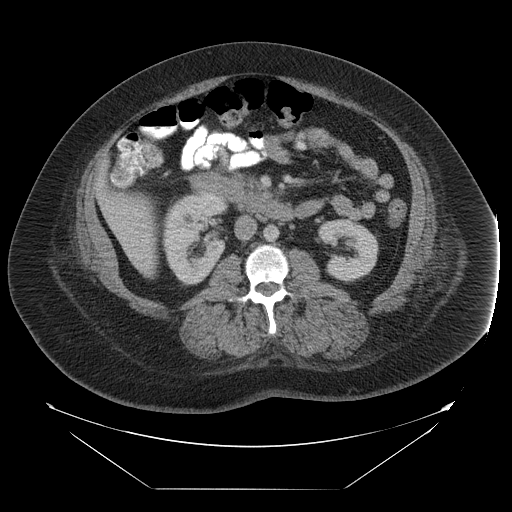
[im 23/62  soft-tissue]
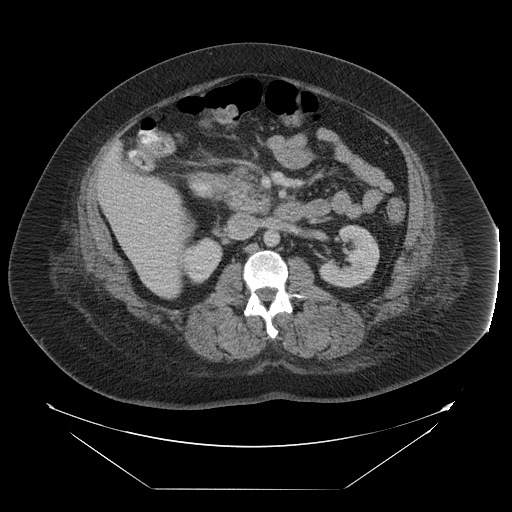
[im 27/62  soft-tissue]
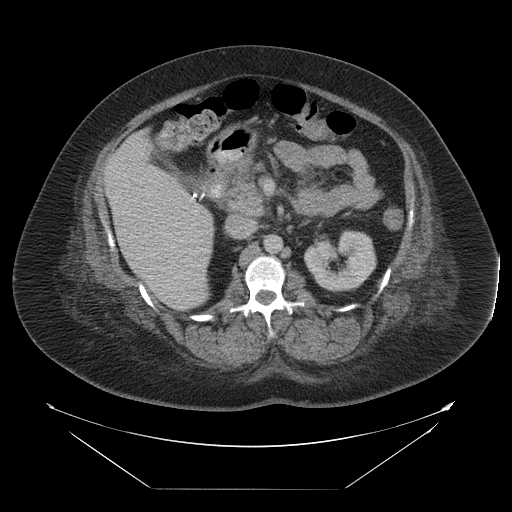
[im 35/62  soft-tissue]
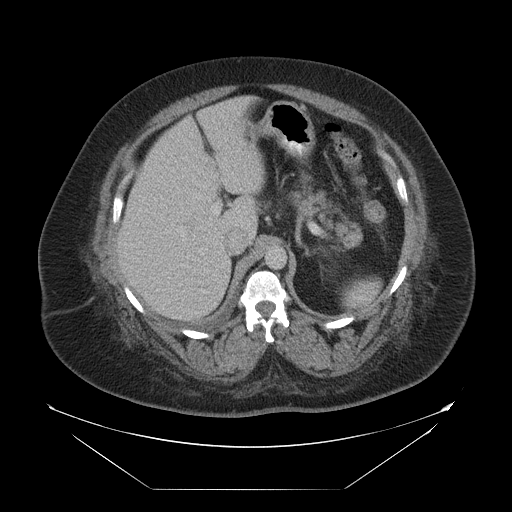
[im 39/62  soft-tissue]
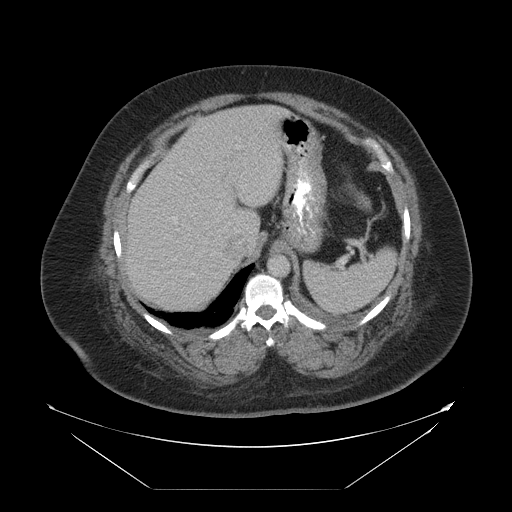
[im 39/62  bone]
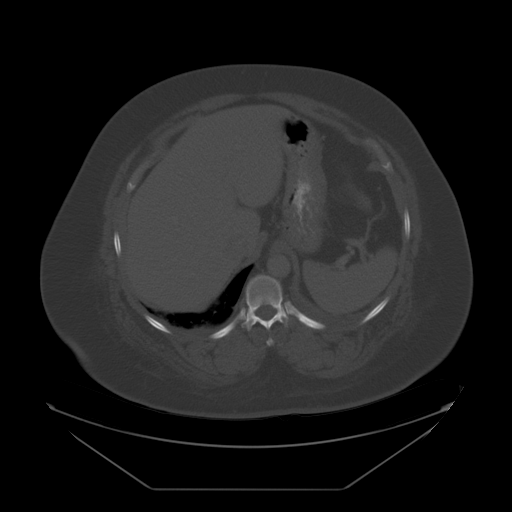
[im 42/62  soft-tissue]
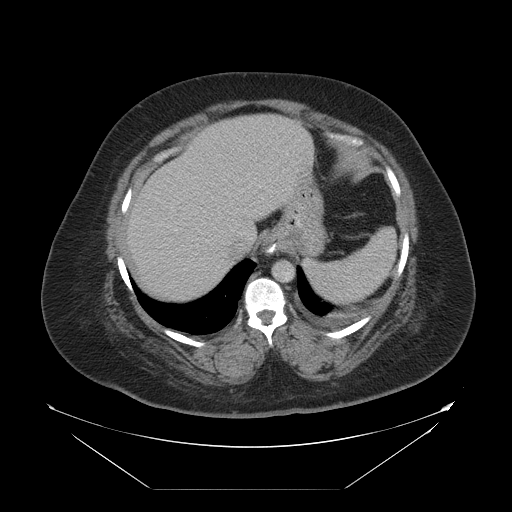
[im 46/62  soft-tissue]
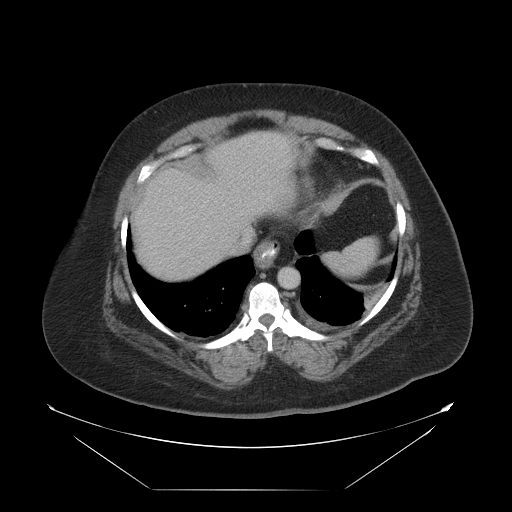
[im 50/62  soft-tissue]
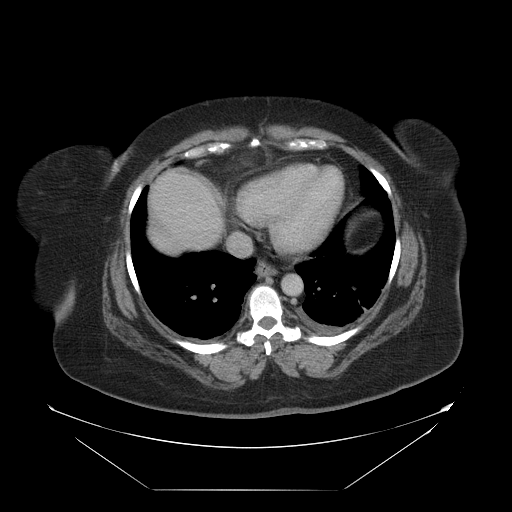
[im 54/62  soft-tissue]
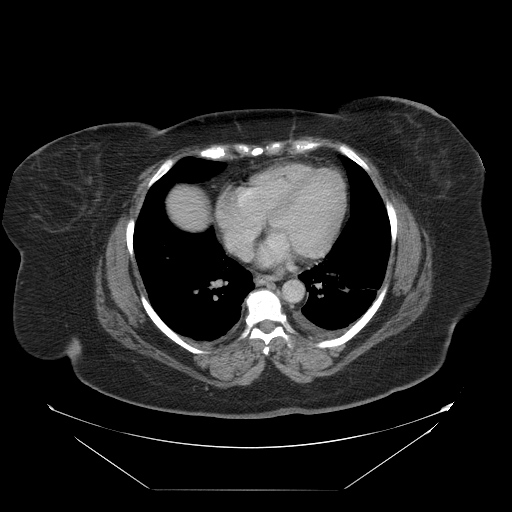
[im 58/62  soft-tissue]
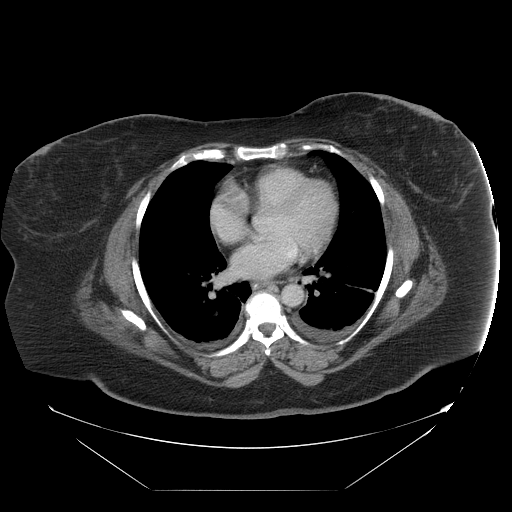

[Series 401: reformatted · coronal · 0.98mm/px · 3 of 152 slices shown]
[im 51/152  soft-tissue]
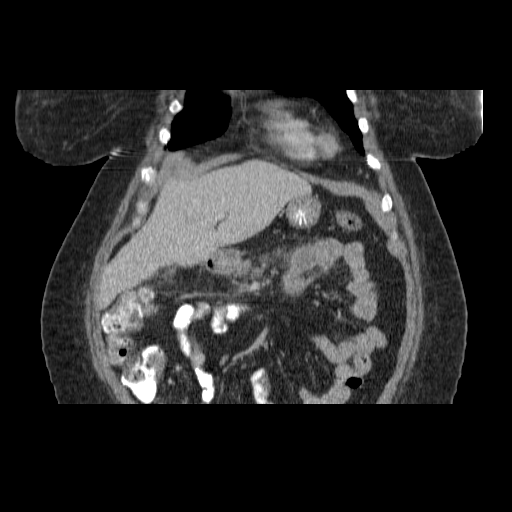
[im 68/152  soft-tissue]
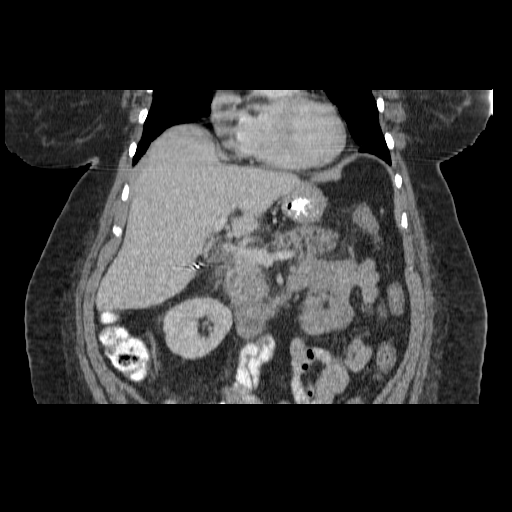
[im 84/152  soft-tissue]
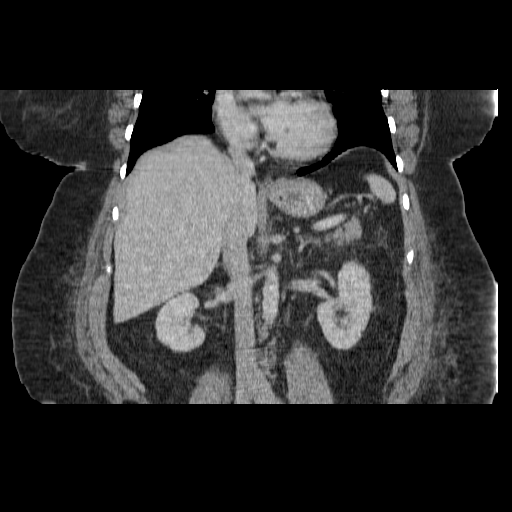

[17 of 46 positions shown; findings below may reference images not displayed]

FINDINGS: Small bilateral pleural effusions and associated atelectasis are present.  There are additional linear areas of likely atelectasis or scarring at the left base.  Some degree of airspace disease at the left lower lobe is not excluded.  Heart size is within normal limits.  
The infused appearance of the liver and spleen is normal.  There are diffuse inflammatory changes about the pancreas without focal fluid collection to suggest abscess or pseudocyst.  The patient is status post cholecystectomy.  Small nodes are present within the hepatoduodenal ligament and within the celiac axis.  may be mildly enlarged, but appears patent.  Kidneys are within normal limits bilaterally.  There is a 9 mm cystic structure of the left kidney that is not significantly changed from the prior exam.  
Bone windows:  No focal lytic or blastic lesions are evident.
IMPRESSION: 1.  Diffuse inflammatory changes of the pancreas consistent with acute pancreatitis.  There is no evidence for abscess or pseudocyst.  
2.  Small nodes within the hepatoduodenal ligament and at the celiac axis are likely reactive in nature.

## 2007-02-08 ENCOUNTER — Encounter (INDEPENDENT_AMBULATORY_CARE_PROVIDER_SITE_OTHER): Payer: Self-pay | Admitting: Infectious Diseases

## 2007-02-08 ENCOUNTER — Ambulatory Visit: Payer: Self-pay | Admitting: Hospitalist

## 2007-02-08 LAB — CONVERTED CEMR LAB
LDL Cholesterol: 79 mg/dL (ref 0–99)
TSH: 1.379 microintl units/mL (ref 0.350–5.50)
Total CHOL/HDL Ratio: 3
VLDL: 47 mg/dL — ABNORMAL HIGH (ref 0–40)

## 2007-02-17 ENCOUNTER — Telehealth (INDEPENDENT_AMBULATORY_CARE_PROVIDER_SITE_OTHER): Payer: Self-pay | Admitting: Internal Medicine

## 2007-02-23 ENCOUNTER — Encounter (INDEPENDENT_AMBULATORY_CARE_PROVIDER_SITE_OTHER): Payer: Self-pay | Admitting: Internal Medicine

## 2007-02-24 ENCOUNTER — Encounter (INDEPENDENT_AMBULATORY_CARE_PROVIDER_SITE_OTHER): Payer: Self-pay | Admitting: Internal Medicine

## 2007-02-24 ENCOUNTER — Ambulatory Visit: Payer: Self-pay | Admitting: Internal Medicine

## 2007-02-24 LAB — CONVERTED CEMR LAB
Calcium: 9 mg/dL (ref 8.4–10.5)
Sodium: 142 meq/L (ref 135–145)

## 2007-02-25 ENCOUNTER — Emergency Department (HOSPITAL_COMMUNITY): Admission: EM | Admit: 2007-02-25 | Discharge: 2007-02-25 | Payer: Self-pay | Admitting: Emergency Medicine

## 2007-03-22 ENCOUNTER — Ambulatory Visit: Payer: Self-pay | Admitting: Hospitalist

## 2007-04-23 ENCOUNTER — Telehealth: Payer: Self-pay | Admitting: Infectious Diseases

## 2007-04-23 ENCOUNTER — Encounter (INDEPENDENT_AMBULATORY_CARE_PROVIDER_SITE_OTHER): Payer: Self-pay | Admitting: Internal Medicine

## 2007-04-30 ENCOUNTER — Telehealth (INDEPENDENT_AMBULATORY_CARE_PROVIDER_SITE_OTHER): Payer: Self-pay | Admitting: Pharmacy Technician

## 2007-05-04 ENCOUNTER — Encounter (INDEPENDENT_AMBULATORY_CARE_PROVIDER_SITE_OTHER): Payer: Self-pay | Admitting: Internal Medicine

## 2007-05-12 ENCOUNTER — Ambulatory Visit: Payer: Self-pay | Admitting: *Deleted

## 2007-05-12 ENCOUNTER — Encounter (INDEPENDENT_AMBULATORY_CARE_PROVIDER_SITE_OTHER): Payer: Self-pay | Admitting: Internal Medicine

## 2007-05-12 LAB — CONVERTED CEMR LAB
TSH: 1.113 microintl units/mL (ref 0.350–5.50)
Vitamin B-12: 892 pg/mL (ref 211–911)

## 2007-05-26 IMAGING — CR DG KNEE 1-2V BILAT
2 series · 2 of 2 positions shown · non-contrast
Comparison: none

CLINICAL DATA: Pain. Crepitus.
 LEFT KNEE ? 2 VIEW:

[view not recorded (1 of 2)]
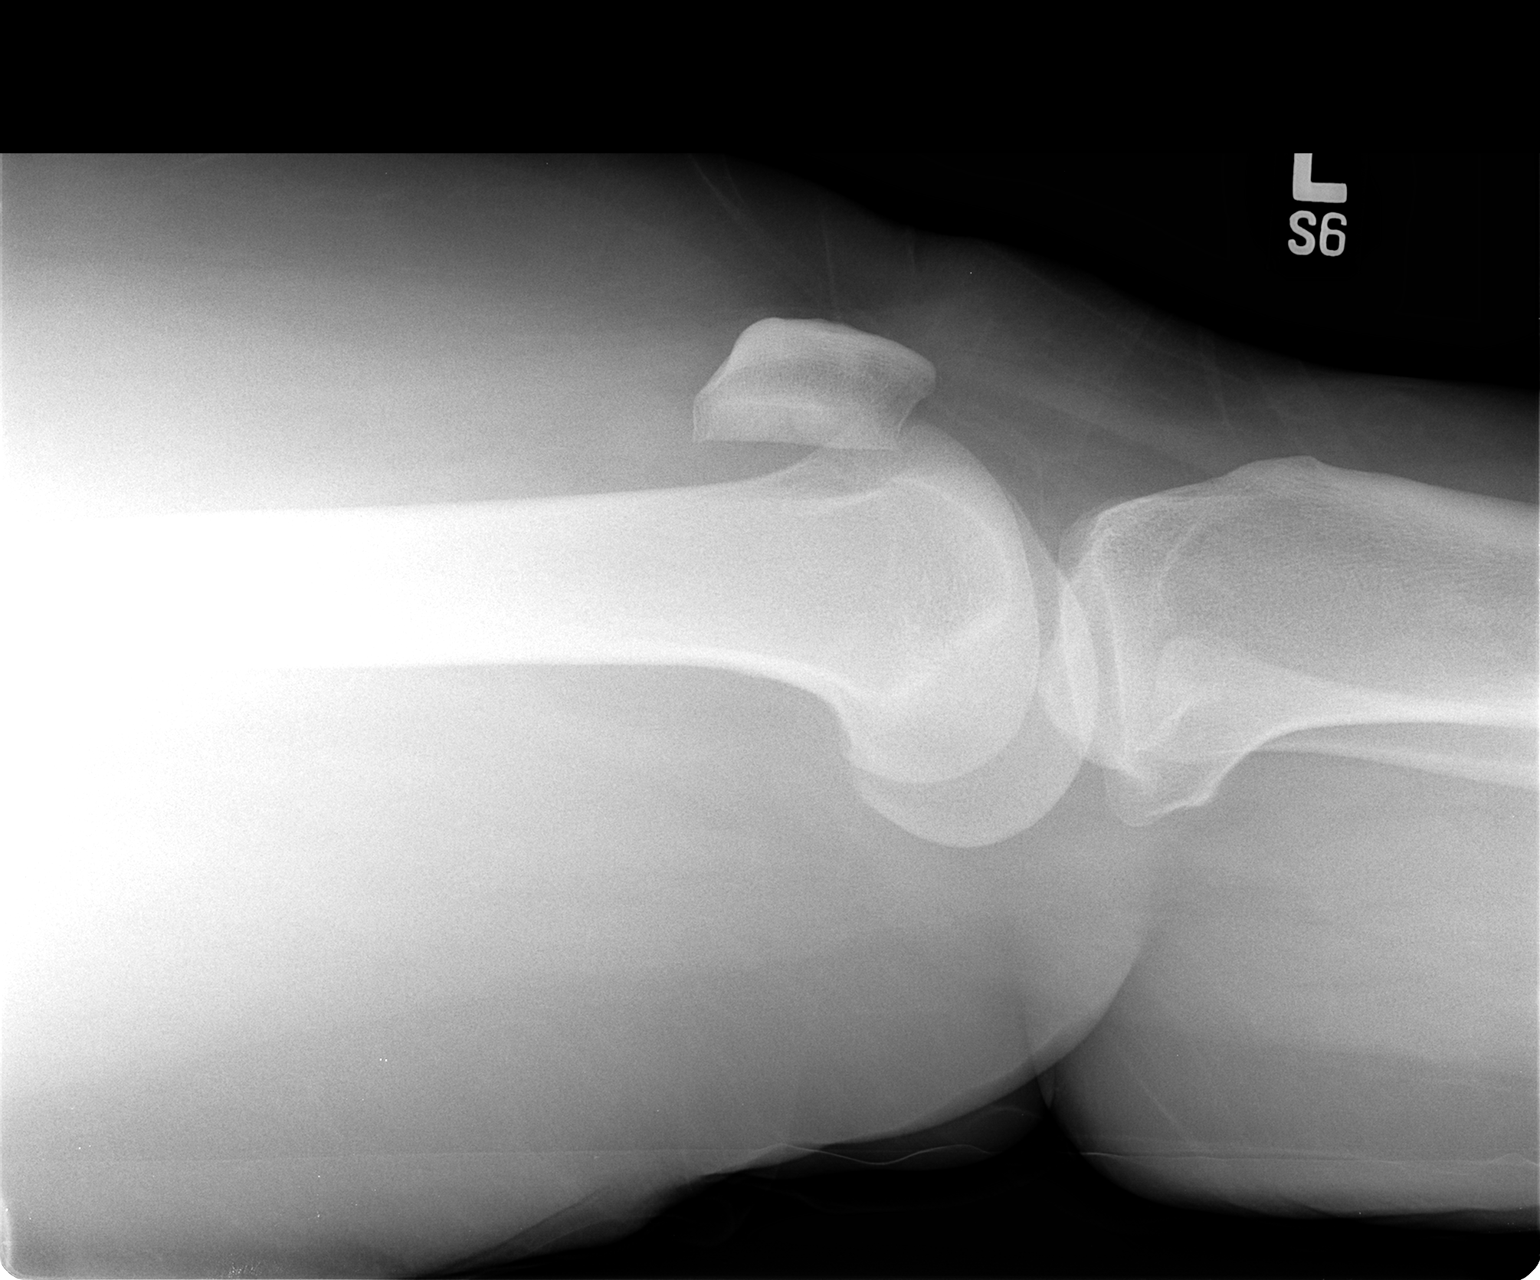

[view not recorded (2 of 2)]
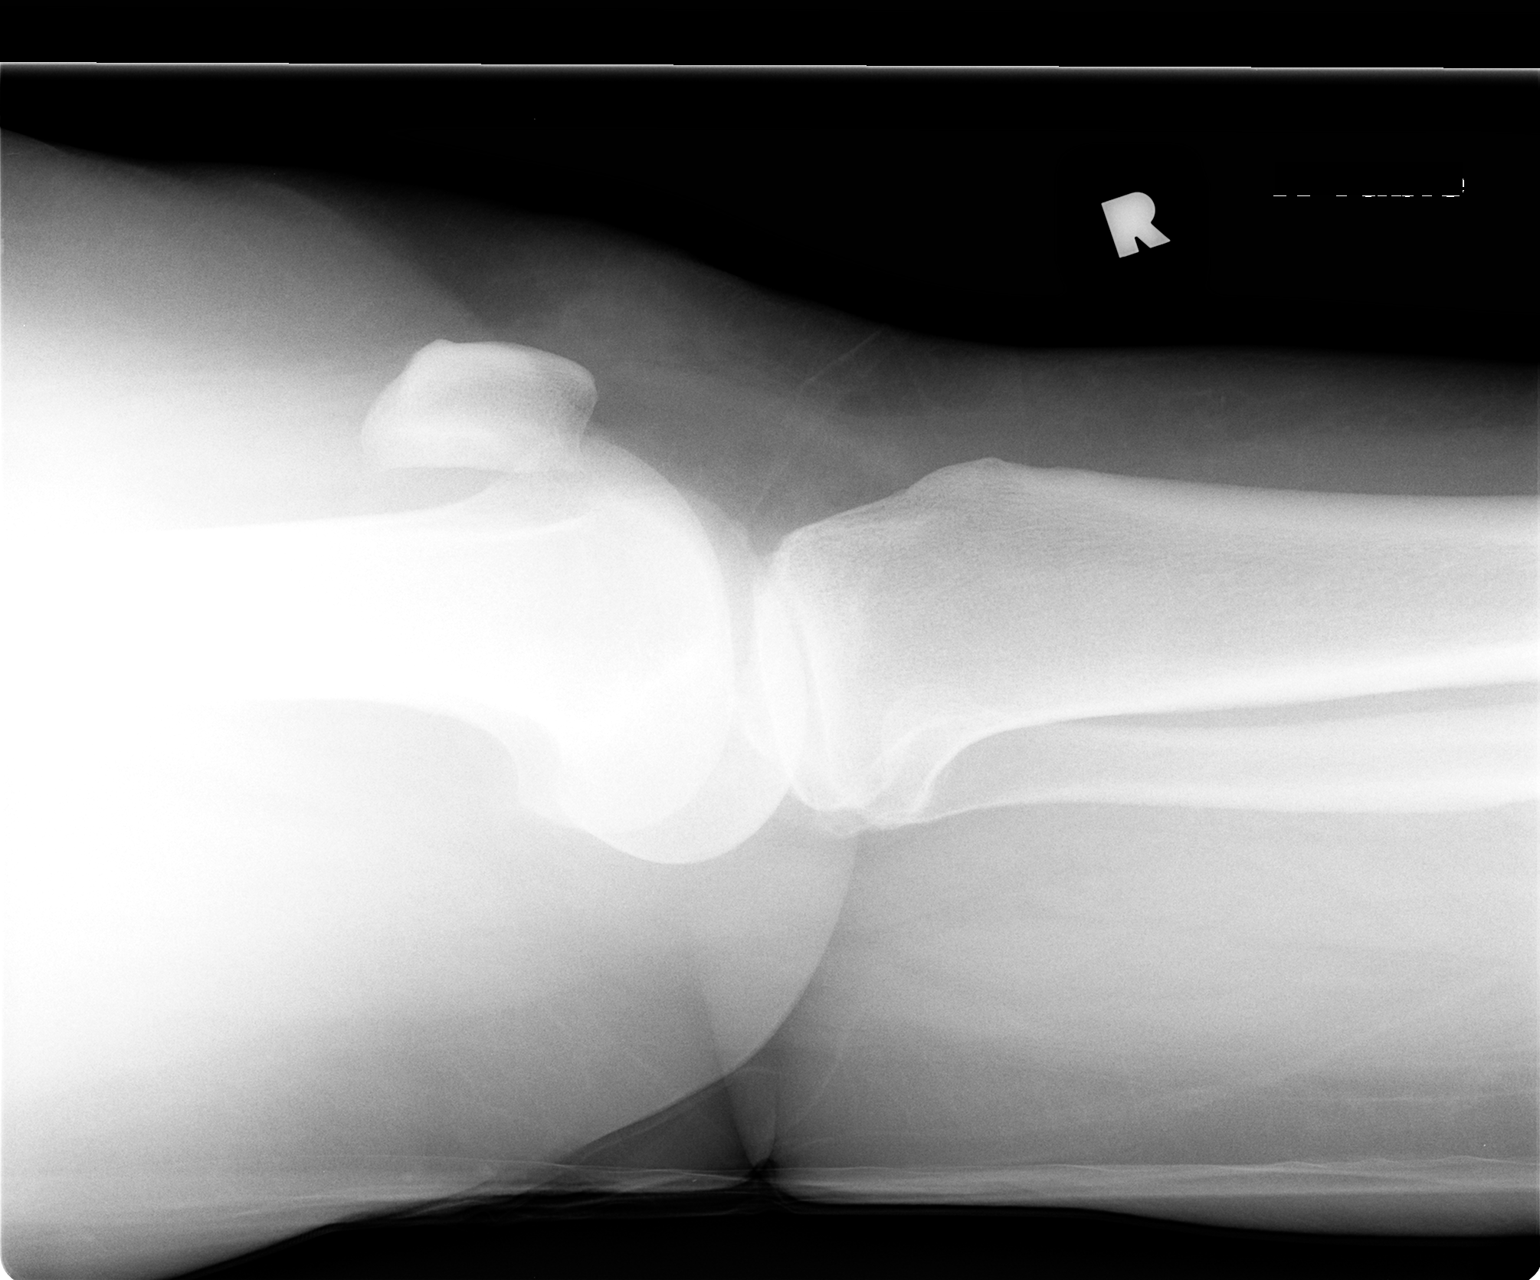

[2 of 2 positions shown; findings below may reference images not displayed]

FINDINGS: There are minor degenerative changes present. There are no erosive or destructive changes. The bones appear intrinsically normal.
IMPRESSION: Minor degenerative arthritic changes.  Otherwise normal study.
 RIGHT KNEE ? 2 VIEW:
FINDINGS: There are mild degenerative arthritic changes present.  There are no erosive or destructive changes.  There is no evidence for joint effusion.
IMPRESSION: Mild degenerative arthritic changes. Otherwise negative study.

## 2007-06-03 ENCOUNTER — Telehealth (INDEPENDENT_AMBULATORY_CARE_PROVIDER_SITE_OTHER): Payer: Self-pay | Admitting: Internal Medicine

## 2007-06-07 ENCOUNTER — Telehealth (INDEPENDENT_AMBULATORY_CARE_PROVIDER_SITE_OTHER): Payer: Self-pay | Admitting: Internal Medicine

## 2007-06-14 IMAGING — CT CT EXTREM LOW W/O CM*R*
1 series · 12 of 14 positions shown, 15 images · IV contrast (agent unspecified)
Comparison: none

CLINICAL DATA: Ankle pain.  Status post surgery in March 2004.  Question sinus tarsi sprain? 
 CT OF THE RIGHT FOOT WITHOUT CONTRAST:
TECHNIQUE: Multidetector CT imaging was performed according to the standard protocol.  No intravenous contrast was administered.  Multiplanar CT image reconstructions were also generated.

[Series 3: lowextremity 2.0 b60s · axial · 0.41mm/px · z∈[-1191,-1041]mm · 12 of 89 slices shown, 15 images]
[im 7/89  soft-tissue]
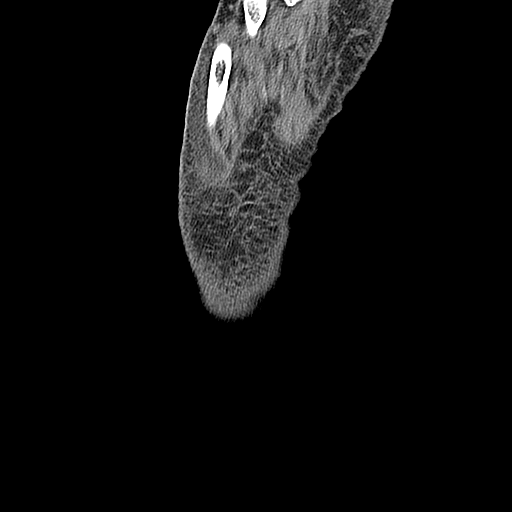
[im 7/89  bone]
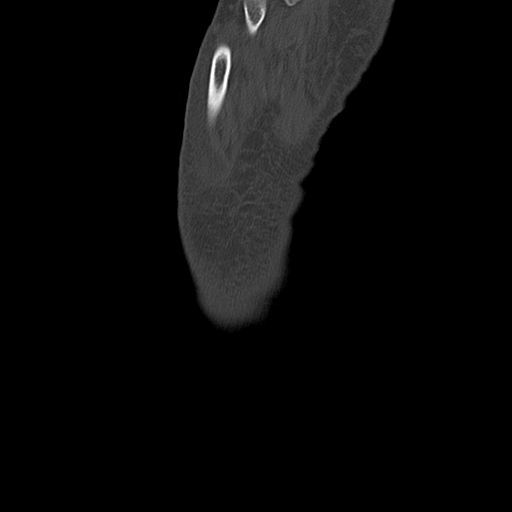
[im 14/89  bone]
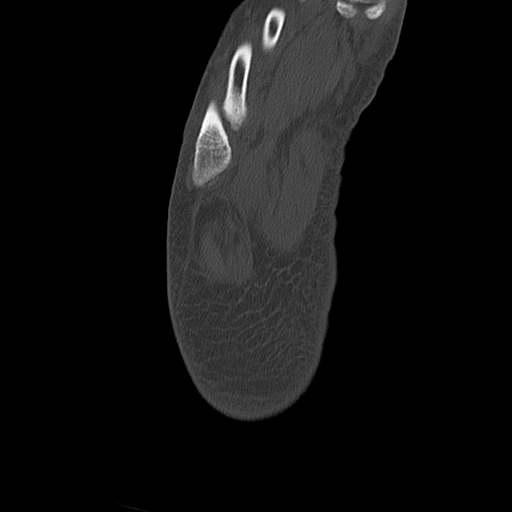
[im 21/89  bone]
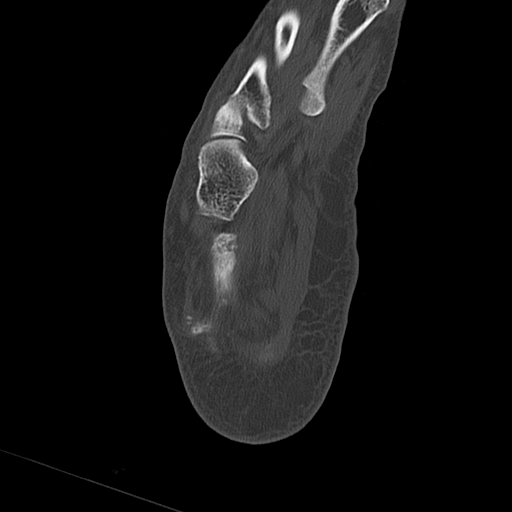
[im 28/89  bone]
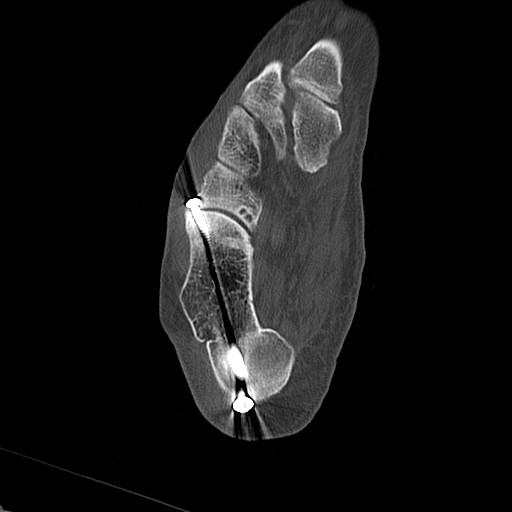
[im 34/89  soft-tissue]
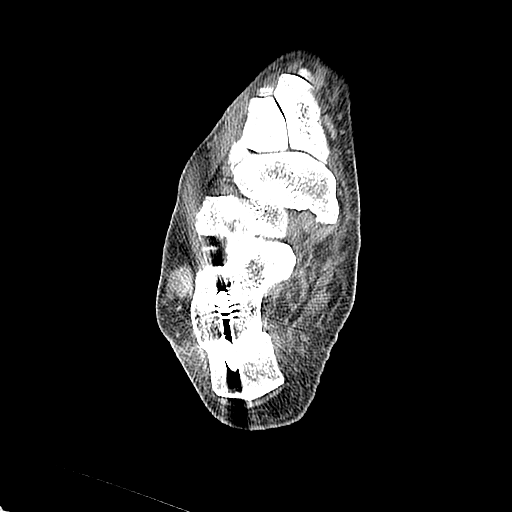
[im 34/89  bone]
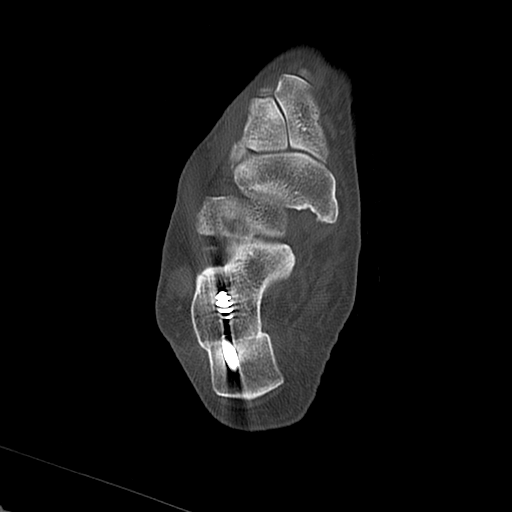
[im 41/89  bone]
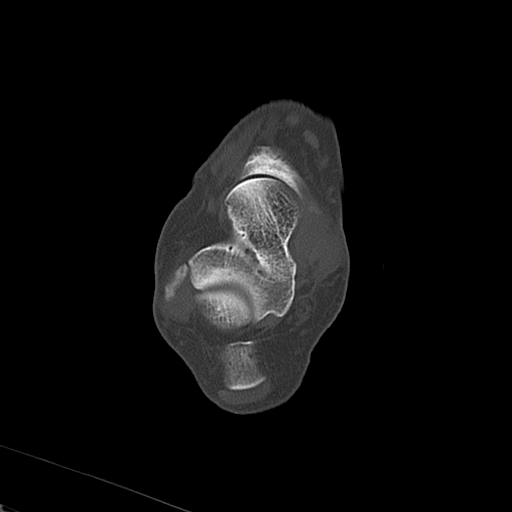
[im 48/89  bone]
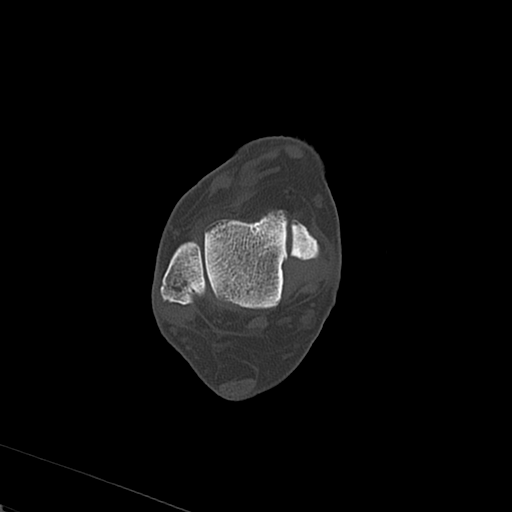
[im 55/89  bone]
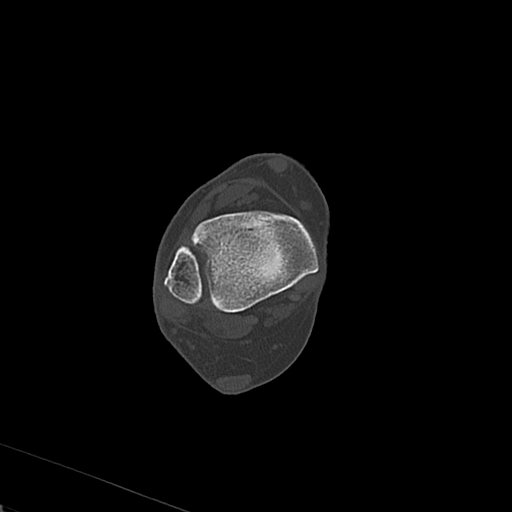
[im 61/89  soft-tissue]
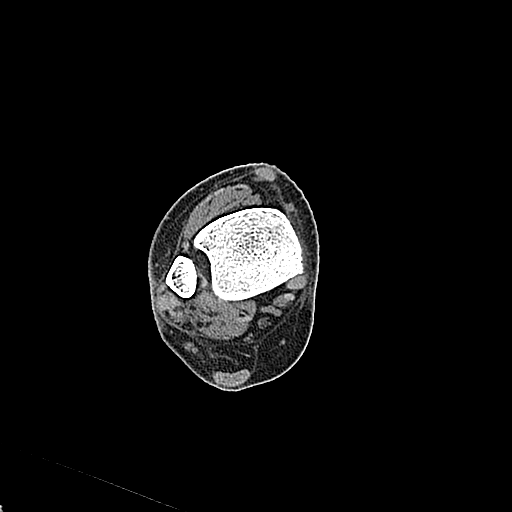
[im 61/89  bone]
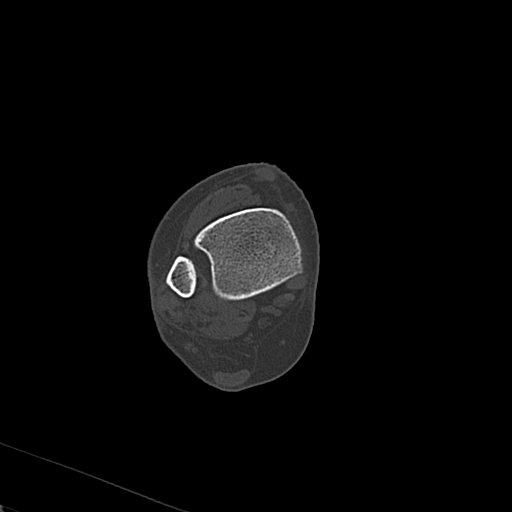
[im 68/89  bone]
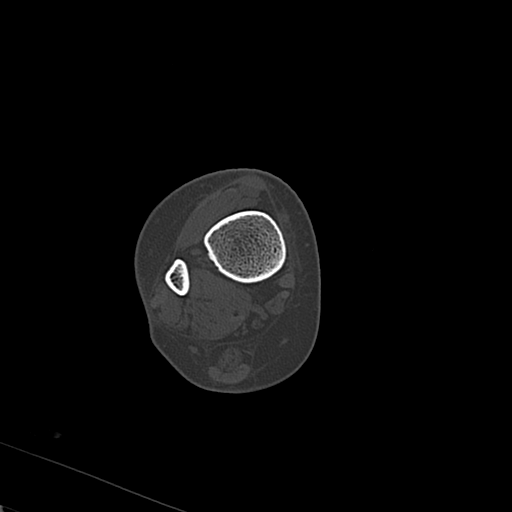
[im 75/89  bone]
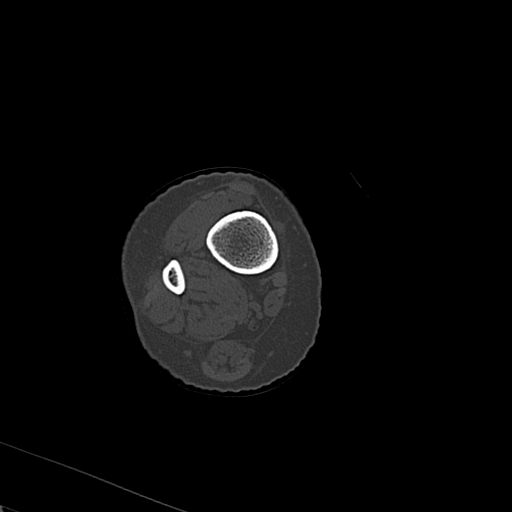
[im 82/89  bone]
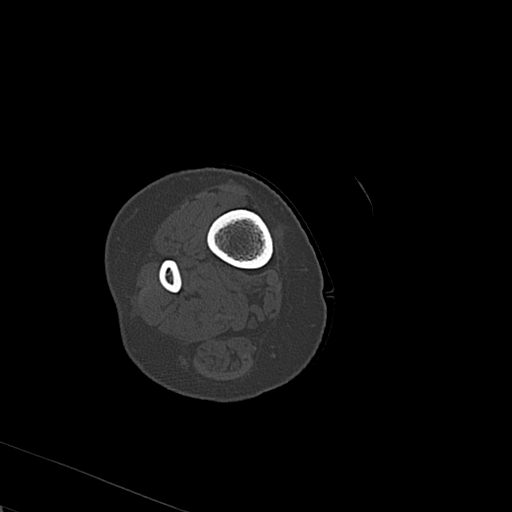

[12 of 14 positions shown; findings below may reference images not displayed]

FINDINGS: Status post realignment of the calcaneus with 2 screws entering from the posterior calcaneus and solitary screw from the anterior calcaneus.  Near the anteriorly placed screw, there is remodeling of the cuboid at the screw head level.  Mild degenerative changes calcaneal-cuboid articulation with areas of subchondral cystic formation and mild sclerosis.  No findings on the present exam to suggest sinus tarsi syndrome.
IMPRESSION: 1.   Status post realignment to the calcaneus with degenerative changes calcaneal-cuboid articulation.  
 2.  No findings on the present exam to suggest sinus tarsi syndrome.

## 2007-06-16 ENCOUNTER — Telehealth (INDEPENDENT_AMBULATORY_CARE_PROVIDER_SITE_OTHER): Payer: Self-pay | Admitting: Internal Medicine

## 2007-06-23 ENCOUNTER — Encounter (INDEPENDENT_AMBULATORY_CARE_PROVIDER_SITE_OTHER): Payer: Self-pay | Admitting: Internal Medicine

## 2007-06-23 ENCOUNTER — Telehealth: Payer: Self-pay | Admitting: *Deleted

## 2007-06-30 ENCOUNTER — Ambulatory Visit: Payer: Self-pay | Admitting: Internal Medicine

## 2007-07-08 ENCOUNTER — Ambulatory Visit: Payer: Self-pay | Admitting: Internal Medicine

## 2007-07-08 ENCOUNTER — Inpatient Hospital Stay (HOSPITAL_COMMUNITY): Admission: RE | Admit: 2007-07-08 | Discharge: 2007-07-16 | Payer: Self-pay | Admitting: Orthopedic Surgery

## 2007-07-08 ENCOUNTER — Encounter (INDEPENDENT_AMBULATORY_CARE_PROVIDER_SITE_OTHER): Payer: Self-pay | Admitting: Internal Medicine

## 2007-07-10 ENCOUNTER — Encounter (INDEPENDENT_AMBULATORY_CARE_PROVIDER_SITE_OTHER): Payer: Self-pay | Admitting: Orthopedic Surgery

## 2007-07-19 ENCOUNTER — Encounter (INDEPENDENT_AMBULATORY_CARE_PROVIDER_SITE_OTHER): Payer: Self-pay | Admitting: Internal Medicine

## 2007-07-19 ENCOUNTER — Ambulatory Visit (HOSPITAL_COMMUNITY): Admission: RE | Admit: 2007-07-19 | Discharge: 2007-07-19 | Payer: Self-pay | Admitting: *Deleted

## 2007-07-19 ENCOUNTER — Encounter (INDEPENDENT_AMBULATORY_CARE_PROVIDER_SITE_OTHER): Payer: Self-pay | Admitting: *Deleted

## 2007-07-19 ENCOUNTER — Encounter: Payer: Self-pay | Admitting: Pharmacist

## 2007-07-19 ENCOUNTER — Telehealth: Payer: Self-pay | Admitting: *Deleted

## 2007-07-19 ENCOUNTER — Ambulatory Visit: Payer: Self-pay | Admitting: *Deleted

## 2007-07-19 DIAGNOSIS — R040 Epistaxis: Secondary | ICD-10-CM

## 2007-07-19 DIAGNOSIS — R0600 Dyspnea, unspecified: Secondary | ICD-10-CM | POA: Insufficient documentation

## 2007-07-19 DIAGNOSIS — R0602 Shortness of breath: Secondary | ICD-10-CM

## 2007-07-19 LAB — CONVERTED CEMR LAB
ALT: 24 units/L (ref 0–35)
AST: 24 units/L (ref 0–37)
Basophils Absolute: 0 10*3/uL (ref 0.0–0.1)
Basophils Relative: 0 % (ref 0–1)
Calcium: 8.7 mg/dL (ref 8.4–10.5)
Chloride: 108 meq/L (ref 96–112)
Creatinine, Ser: 0.96 mg/dL (ref 0.40–1.20)
Hemoglobin: 9.1 g/dL — ABNORMAL LOW (ref 12.0–15.0)
INR: 1.9
MCHC: 34.4 g/dL (ref 30.0–36.0)
Monocytes Absolute: 0.7 10*3/uL (ref 0.1–1.0)
Neutro Abs: 10.1 10*3/uL — ABNORMAL HIGH (ref 1.7–7.7)
RDW: 15.7 % — ABNORMAL HIGH (ref 11.5–15.5)
Total Bilirubin: 0.4 mg/dL (ref 0.3–1.2)

## 2007-07-26 ENCOUNTER — Ambulatory Visit: Payer: Self-pay | Admitting: *Deleted

## 2007-08-04 ENCOUNTER — Ambulatory Visit: Payer: Self-pay | Admitting: *Deleted

## 2007-08-04 DIAGNOSIS — H109 Unspecified conjunctivitis: Secondary | ICD-10-CM | POA: Insufficient documentation

## 2007-08-09 ENCOUNTER — Ambulatory Visit: Payer: Self-pay | Admitting: Internal Medicine

## 2007-08-09 LAB — CONVERTED CEMR LAB: INR: 1.1

## 2007-08-12 ENCOUNTER — Inpatient Hospital Stay (HOSPITAL_COMMUNITY): Admission: AD | Admit: 2007-08-12 | Discharge: 2007-08-16 | Payer: Self-pay | Admitting: Orthopedic Surgery

## 2007-09-01 ENCOUNTER — Encounter (INDEPENDENT_AMBULATORY_CARE_PROVIDER_SITE_OTHER): Payer: Self-pay | Admitting: Internal Medicine

## 2007-09-01 ENCOUNTER — Ambulatory Visit: Payer: Self-pay | Admitting: *Deleted

## 2007-09-01 LAB — CONVERTED CEMR LAB
ALT: 15 units/L (ref 0–35)
AST: 13 units/L (ref 0–37)
Albumin: 4.6 g/dL (ref 3.5–5.2)
Calcium: 9.7 mg/dL (ref 8.4–10.5)
Chloride: 102 meq/L (ref 96–112)
Potassium: 4.3 meq/L (ref 3.5–5.3)
Sodium: 139 meq/L (ref 135–145)
Total Protein: 7.5 g/dL (ref 6.0–8.3)

## 2007-09-22 ENCOUNTER — Encounter: Admission: RE | Admit: 2007-09-22 | Discharge: 2007-12-21 | Payer: Self-pay | Admitting: Orthopedic Surgery

## 2007-10-05 ENCOUNTER — Telehealth (INDEPENDENT_AMBULATORY_CARE_PROVIDER_SITE_OTHER): Payer: Self-pay | Admitting: Internal Medicine

## 2007-10-20 ENCOUNTER — Ambulatory Visit: Payer: Self-pay | Admitting: Internal Medicine

## 2007-10-20 ENCOUNTER — Encounter (INDEPENDENT_AMBULATORY_CARE_PROVIDER_SITE_OTHER): Payer: Self-pay | Admitting: Internal Medicine

## 2007-10-20 ENCOUNTER — Encounter (INDEPENDENT_AMBULATORY_CARE_PROVIDER_SITE_OTHER): Payer: Self-pay | Admitting: *Deleted

## 2007-10-20 DIAGNOSIS — R42 Dizziness and giddiness: Secondary | ICD-10-CM

## 2007-10-22 ENCOUNTER — Telehealth: Payer: Self-pay | Admitting: Internal Medicine

## 2007-10-27 ENCOUNTER — Telehealth: Payer: Self-pay | Admitting: Internal Medicine

## 2007-12-21 ENCOUNTER — Ambulatory Visit: Payer: Self-pay | Admitting: Internal Medicine

## 2007-12-21 ENCOUNTER — Encounter (INDEPENDENT_AMBULATORY_CARE_PROVIDER_SITE_OTHER): Payer: Self-pay | Admitting: Internal Medicine

## 2007-12-21 DIAGNOSIS — K5900 Constipation, unspecified: Secondary | ICD-10-CM | POA: Insufficient documentation

## 2007-12-23 ENCOUNTER — Telehealth: Payer: Self-pay | Admitting: *Deleted

## 2007-12-25 IMAGING — MG MM DIGITAL SCREENING BILAT
5 series · 5 of 5 positions shown · non-contrast
Comparison: none

DG SCREEN MAMMOGRAM BILATERAL
Bilateral CC and MLO view(s) were taken.

DIGITAL SCREENING MAMMOGRAM WITH CAD:
There is a fibrofatty pattern.  No masses or malignant type calcifications are identified.  
Compared with prior studies.

[R CC]
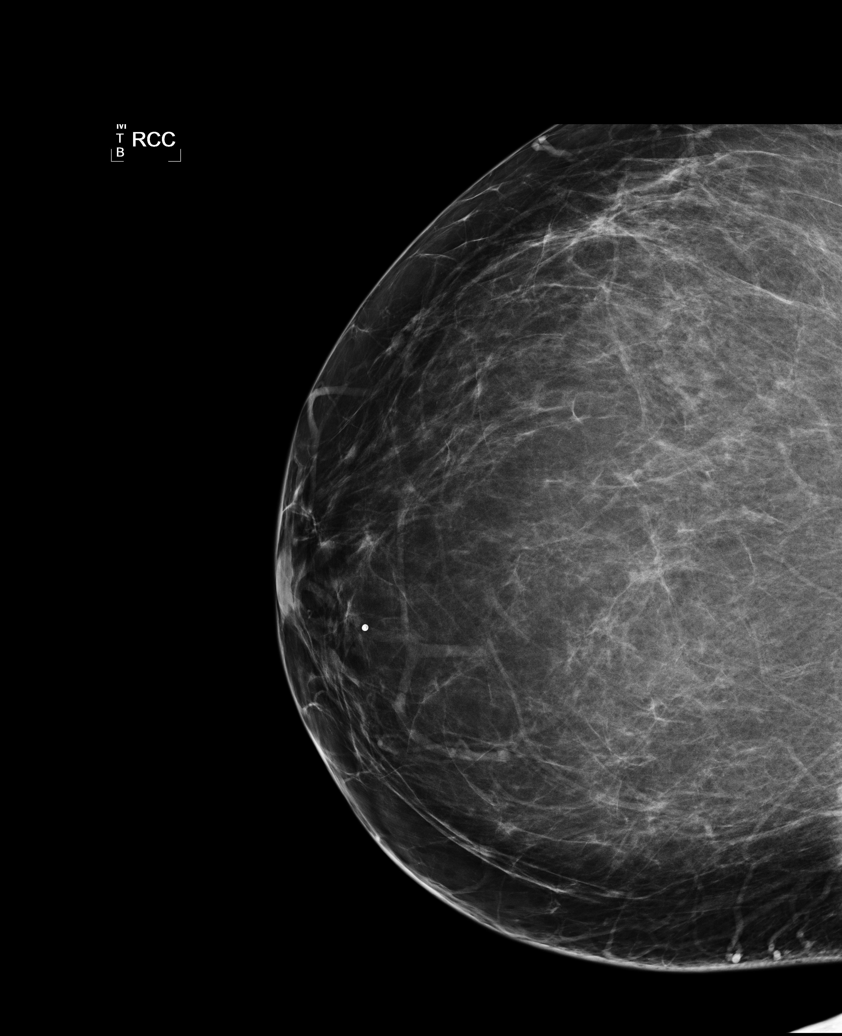

[R MLO]
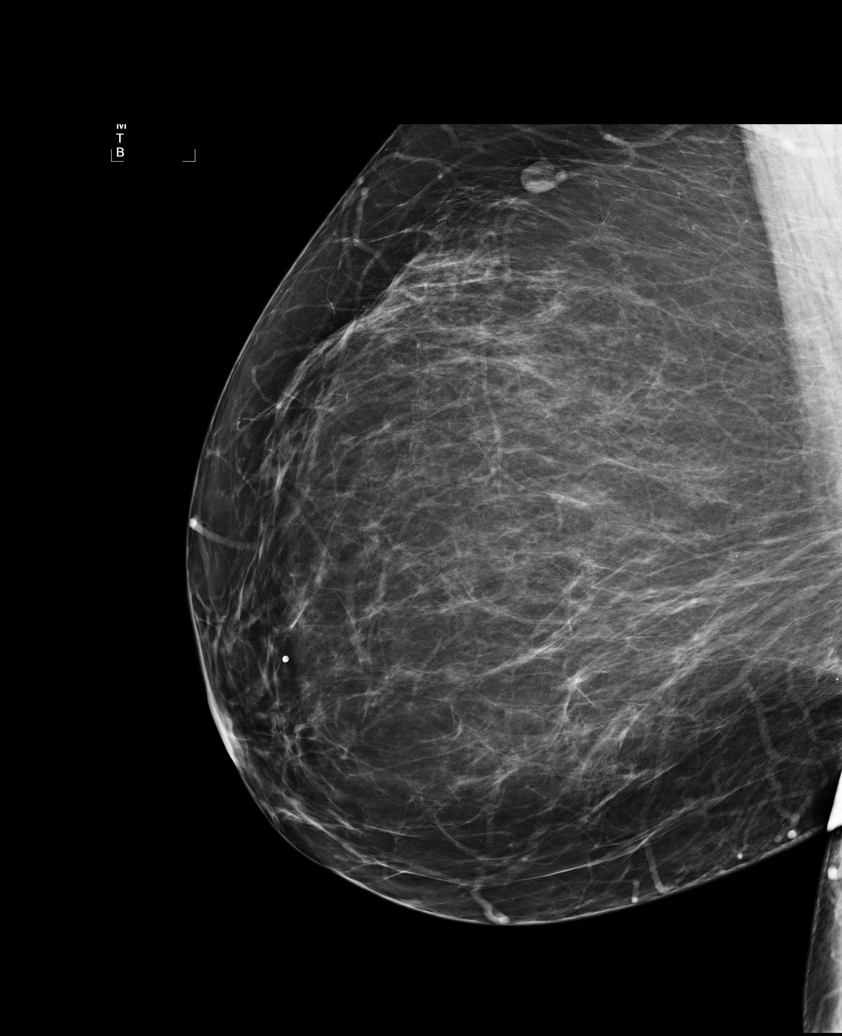

[L CC]
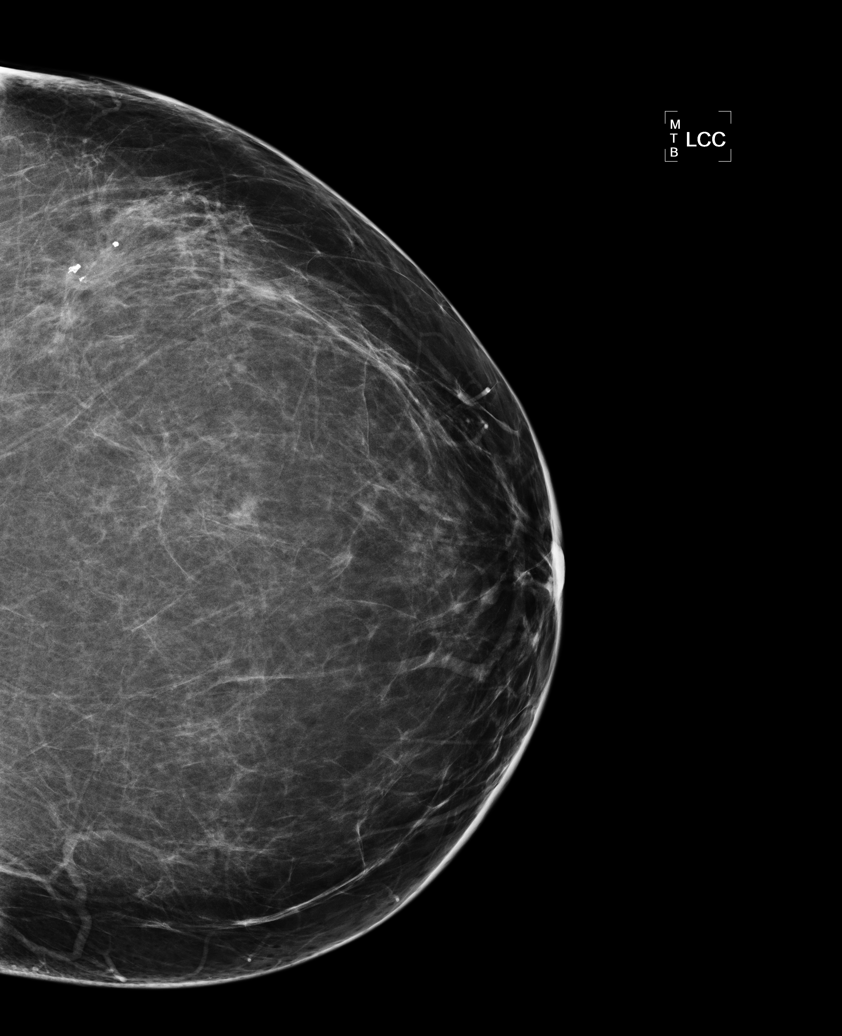

[L MLO (1 of 2)]
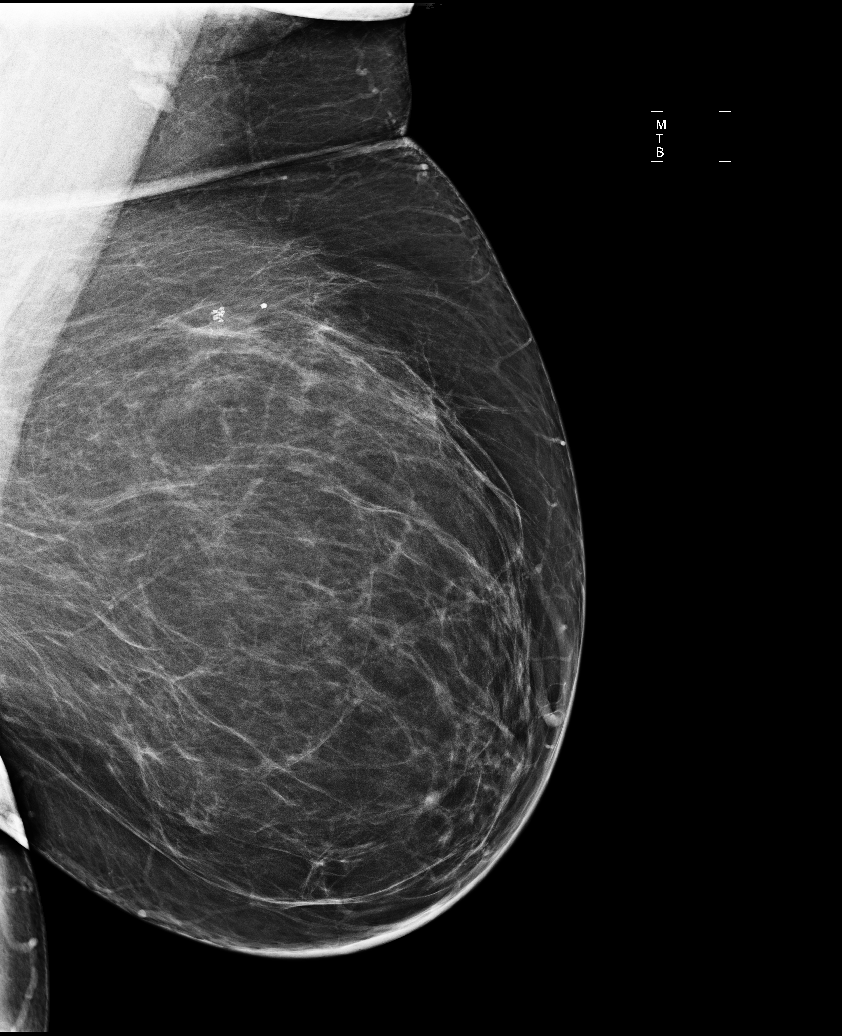

[L MLO (2 of 2)]
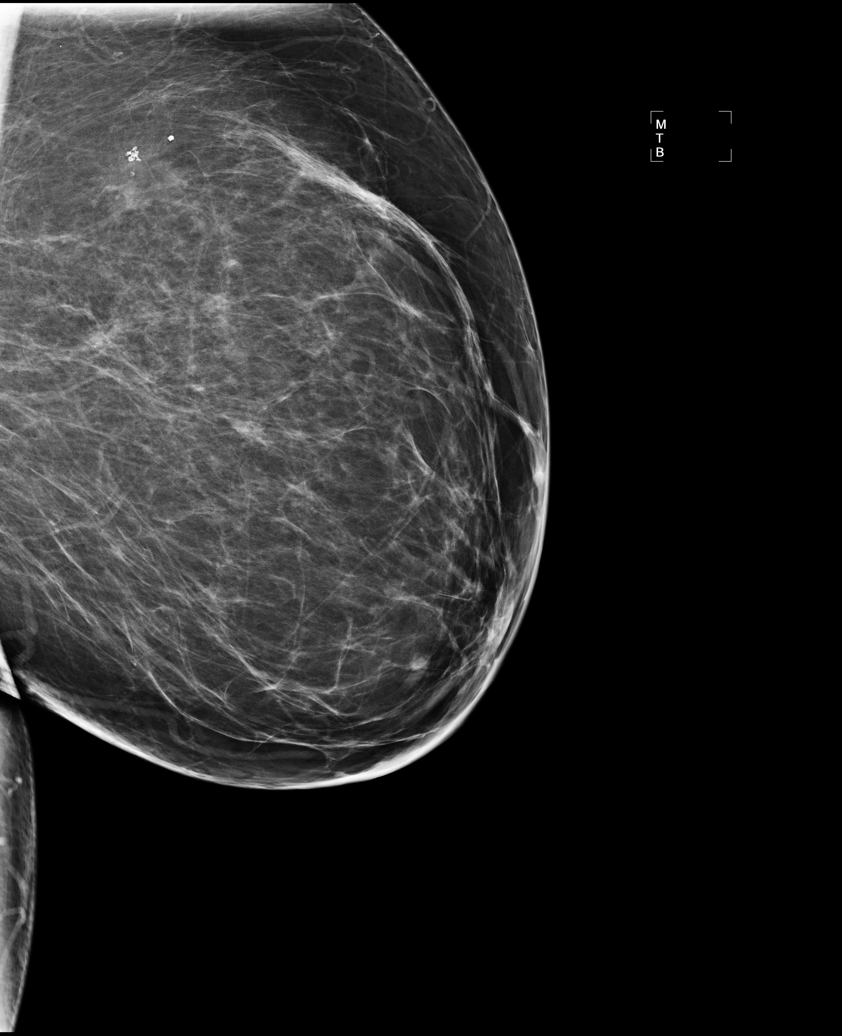

[5 of 5 positions shown; findings below may reference images not displayed]

IMPRESSION: No specific mammographic evidence of malignancy.  Next screening mammogram is recommended in one 
year.

ASSESSMENT: Negative - BI-RADS 1

Screening mammogram in 1 year.
ANALYZED BY COMPUTER AIDED DETECTION. , THIS PROCEDURE WAS A DIGITAL MAMMOGRAM.

## 2008-01-07 ENCOUNTER — Telehealth: Payer: Self-pay | Admitting: *Deleted

## 2008-01-11 ENCOUNTER — Encounter (INDEPENDENT_AMBULATORY_CARE_PROVIDER_SITE_OTHER): Payer: Self-pay | Admitting: Internal Medicine

## 2008-01-18 ENCOUNTER — Telehealth: Payer: Self-pay | Admitting: *Deleted

## 2008-01-18 ENCOUNTER — Encounter (INDEPENDENT_AMBULATORY_CARE_PROVIDER_SITE_OTHER): Payer: Self-pay | Admitting: Internal Medicine

## 2008-01-18 ENCOUNTER — Ambulatory Visit: Payer: Self-pay | Admitting: Internal Medicine

## 2008-01-26 ENCOUNTER — Encounter (INDEPENDENT_AMBULATORY_CARE_PROVIDER_SITE_OTHER): Payer: Self-pay | Admitting: Internal Medicine

## 2008-02-07 ENCOUNTER — Encounter (INDEPENDENT_AMBULATORY_CARE_PROVIDER_SITE_OTHER): Payer: Self-pay | Admitting: Internal Medicine

## 2008-02-07 ENCOUNTER — Ambulatory Visit: Payer: Self-pay | Admitting: Internal Medicine

## 2008-02-07 DIAGNOSIS — R5381 Other malaise: Secondary | ICD-10-CM

## 2008-02-07 DIAGNOSIS — R5383 Other fatigue: Secondary | ICD-10-CM

## 2008-02-25 ENCOUNTER — Encounter: Payer: Self-pay | Admitting: Internal Medicine

## 2008-02-25 ENCOUNTER — Ambulatory Visit: Payer: Self-pay | Admitting: Infectious Disease

## 2008-02-25 ENCOUNTER — Ambulatory Visit (HOSPITAL_COMMUNITY): Admission: RE | Admit: 2008-02-25 | Discharge: 2008-02-25 | Payer: Self-pay | Admitting: Infectious Disease

## 2008-02-25 LAB — CONVERTED CEMR LAB
Albumin: 3.8 g/dL (ref 3.5–5.2)
Alkaline Phosphatase: 76 units/L (ref 39–117)
BUN: 17 mg/dL (ref 6–23)
CO2: 25 meq/L (ref 19–32)
Calcium: 9.4 mg/dL (ref 8.4–10.5)
Chloride: 103 meq/L (ref 96–112)
Eosinophils Absolute: 0.2 10*3/uL (ref 0.0–0.7)
Glucose, Bld: 116 mg/dL — ABNORMAL HIGH (ref 70–99)
HCT: 35.4 % — ABNORMAL LOW (ref 36.0–46.0)
Lymphocytes Relative: 28 % (ref 12–46)
Lymphs Abs: 2.2 10*3/uL (ref 0.7–4.0)
MCV: 84.6 fL (ref 78.0–100.0)
Monocytes Relative: 6 % (ref 3–12)
Neutrophils Relative %: 63 % (ref 43–77)
Potassium: 4.1 meq/L (ref 3.5–5.3)
RBC: 4.18 M/uL (ref 3.87–5.11)
Total Protein: 7 g/dL (ref 6.0–8.3)
WBC: 7.9 10*3/uL (ref 4.0–10.5)

## 2008-03-14 LAB — CONVERTED CEMR LAB
Basophils Absolute: 0.1 10*3/uL (ref 0.0–0.1)
Basophils Relative: 1 % (ref 0–1)
Cholesterol: 204 mg/dL — ABNORMAL HIGH (ref 0–200)
Ferritin: 22 ng/mL (ref 10–291)
HDL: 63 mg/dL (ref >39)
Iron: 58 ug/dL (ref 42–145)
Lymphocytes Relative: 32 % (ref 12–46)
Neutro Abs: 4 10*3/uL (ref 1.7–7.7)
Platelets: 264 10*3/uL (ref 150–400)
RDW: 16.6 % — ABNORMAL HIGH (ref 11.5–15.5)
Total CHOL/HDL Ratio: 3.2

## 2008-04-04 ENCOUNTER — Encounter (INDEPENDENT_AMBULATORY_CARE_PROVIDER_SITE_OTHER): Payer: Self-pay | Admitting: Internal Medicine

## 2008-04-04 ENCOUNTER — Ambulatory Visit (HOSPITAL_COMMUNITY): Admission: RE | Admit: 2008-04-04 | Discharge: 2008-04-04 | Payer: Self-pay | Admitting: Internal Medicine

## 2008-04-04 ENCOUNTER — Ambulatory Visit: Payer: Self-pay | Admitting: Internal Medicine

## 2008-04-04 LAB — CONVERTED CEMR LAB
ALT: 17 units/L (ref 0–35)
AST: 19 units/L (ref 0–37)
Albumin: 3.7 g/dL (ref 3.5–5.2)
Alkaline Phosphatase: 77 units/L (ref 39–117)
Calcium: 9.1 mg/dL (ref 8.4–10.5)
Chloride: 103 meq/L (ref 96–112)
Potassium: 3.7 meq/L (ref 3.5–5.3)
Sodium: 138 meq/L (ref 135–145)
Total Protein: 7 g/dL (ref 6.0–8.3)

## 2008-04-05 ENCOUNTER — Telehealth: Payer: Self-pay | Admitting: *Deleted

## 2008-04-05 ENCOUNTER — Telehealth (INDEPENDENT_AMBULATORY_CARE_PROVIDER_SITE_OTHER): Payer: Self-pay | Admitting: Internal Medicine

## 2008-04-17 ENCOUNTER — Telehealth (INDEPENDENT_AMBULATORY_CARE_PROVIDER_SITE_OTHER): Payer: Self-pay | Admitting: Internal Medicine

## 2008-04-18 ENCOUNTER — Ambulatory Visit (HOSPITAL_COMMUNITY): Admission: RE | Admit: 2008-04-18 | Discharge: 2008-04-18 | Payer: Self-pay | Admitting: *Deleted

## 2008-04-19 ENCOUNTER — Encounter: Admission: RE | Admit: 2008-04-19 | Discharge: 2008-06-12 | Payer: Self-pay | Admitting: Internal Medicine

## 2008-04-26 ENCOUNTER — Telehealth (INDEPENDENT_AMBULATORY_CARE_PROVIDER_SITE_OTHER): Payer: Self-pay | Admitting: Internal Medicine

## 2008-04-27 ENCOUNTER — Ambulatory Visit: Payer: Self-pay | Admitting: Internal Medicine

## 2008-05-01 ENCOUNTER — Encounter (INDEPENDENT_AMBULATORY_CARE_PROVIDER_SITE_OTHER): Payer: Self-pay | Admitting: Internal Medicine

## 2008-05-08 ENCOUNTER — Telehealth (INDEPENDENT_AMBULATORY_CARE_PROVIDER_SITE_OTHER): Payer: Self-pay | Admitting: Internal Medicine

## 2008-06-01 ENCOUNTER — Encounter (INDEPENDENT_AMBULATORY_CARE_PROVIDER_SITE_OTHER): Payer: Self-pay | Admitting: Internal Medicine

## 2008-06-02 ENCOUNTER — Ambulatory Visit: Payer: Self-pay | Admitting: Internal Medicine

## 2008-06-02 DIAGNOSIS — J4530 Mild persistent asthma, uncomplicated: Secondary | ICD-10-CM

## 2008-06-05 ENCOUNTER — Telehealth (INDEPENDENT_AMBULATORY_CARE_PROVIDER_SITE_OTHER): Payer: Self-pay | Admitting: Internal Medicine

## 2008-06-12 ENCOUNTER — Encounter (INDEPENDENT_AMBULATORY_CARE_PROVIDER_SITE_OTHER): Payer: Self-pay | Admitting: Internal Medicine

## 2008-06-12 ENCOUNTER — Telehealth (INDEPENDENT_AMBULATORY_CARE_PROVIDER_SITE_OTHER): Payer: Self-pay | Admitting: Internal Medicine

## 2008-06-15 ENCOUNTER — Encounter (INDEPENDENT_AMBULATORY_CARE_PROVIDER_SITE_OTHER): Payer: Self-pay | Admitting: Internal Medicine

## 2008-06-26 ENCOUNTER — Telehealth (INDEPENDENT_AMBULATORY_CARE_PROVIDER_SITE_OTHER): Payer: Self-pay | Admitting: Internal Medicine

## 2008-07-18 ENCOUNTER — Encounter (INDEPENDENT_AMBULATORY_CARE_PROVIDER_SITE_OTHER): Payer: Self-pay | Admitting: Internal Medicine

## 2008-07-24 ENCOUNTER — Telehealth (INDEPENDENT_AMBULATORY_CARE_PROVIDER_SITE_OTHER): Payer: Self-pay | Admitting: Internal Medicine

## 2008-08-28 ENCOUNTER — Ambulatory Visit: Payer: Self-pay | Admitting: Infectious Diseases

## 2008-09-05 ENCOUNTER — Encounter (INDEPENDENT_AMBULATORY_CARE_PROVIDER_SITE_OTHER): Payer: Self-pay | Admitting: Internal Medicine

## 2008-09-07 ENCOUNTER — Ambulatory Visit (HOSPITAL_COMMUNITY): Admission: RE | Admit: 2008-09-07 | Discharge: 2008-09-07 | Payer: Self-pay | Admitting: Internal Medicine

## 2008-09-12 ENCOUNTER — Encounter (INDEPENDENT_AMBULATORY_CARE_PROVIDER_SITE_OTHER): Payer: Self-pay | Admitting: Internal Medicine

## 2008-09-26 ENCOUNTER — Telehealth (INDEPENDENT_AMBULATORY_CARE_PROVIDER_SITE_OTHER): Payer: Self-pay | Admitting: Internal Medicine

## 2008-10-05 ENCOUNTER — Ambulatory Visit: Payer: Self-pay | Admitting: Internal Medicine

## 2008-10-05 DIAGNOSIS — B009 Herpesviral infection, unspecified: Secondary | ICD-10-CM | POA: Insufficient documentation

## 2008-10-11 ENCOUNTER — Encounter (INDEPENDENT_AMBULATORY_CARE_PROVIDER_SITE_OTHER): Payer: Self-pay | Admitting: Internal Medicine

## 2008-10-16 ENCOUNTER — Telehealth: Payer: Self-pay | Admitting: Infectious Disease

## 2008-10-25 ENCOUNTER — Telehealth (INDEPENDENT_AMBULATORY_CARE_PROVIDER_SITE_OTHER): Payer: Self-pay | Admitting: Internal Medicine

## 2008-11-06 ENCOUNTER — Ambulatory Visit: Payer: Self-pay | Admitting: Internal Medicine

## 2008-11-07 ENCOUNTER — Encounter (INDEPENDENT_AMBULATORY_CARE_PROVIDER_SITE_OTHER): Payer: Self-pay | Admitting: Internal Medicine

## 2008-11-07 LAB — CONVERTED CEMR LAB
BUN: 11 mg/dL (ref 6–23)
Calcium: 9 mg/dL (ref 8.4–10.5)
Creatinine, Ser: 1.16 mg/dL (ref 0.40–1.20)
TSH: 0.207 microintl units/mL — ABNORMAL LOW (ref 0.350–4.5)

## 2008-11-08 ENCOUNTER — Ambulatory Visit: Payer: Self-pay | Admitting: Internal Medicine

## 2008-11-09 ENCOUNTER — Encounter (INDEPENDENT_AMBULATORY_CARE_PROVIDER_SITE_OTHER): Payer: Self-pay | Admitting: Internal Medicine

## 2008-11-09 LAB — CONVERTED CEMR LAB
Cholesterol: 161 mg/dL (ref 0–200)
Total CHOL/HDL Ratio: 2.6

## 2008-11-14 ENCOUNTER — Telehealth: Payer: Self-pay | Admitting: *Deleted

## 2008-11-27 ENCOUNTER — Encounter (INDEPENDENT_AMBULATORY_CARE_PROVIDER_SITE_OTHER): Payer: Self-pay | Admitting: Internal Medicine

## 2008-12-27 IMAGING — CR DG CHEST 2V
2 series · 2 of 2 positions shown · non-contrast
Comparison: 04/05/2004

CLINICAL DATA: Preop knee replacement.

CHEST - 2 VIEW

[view not recorded (1 of 2)]
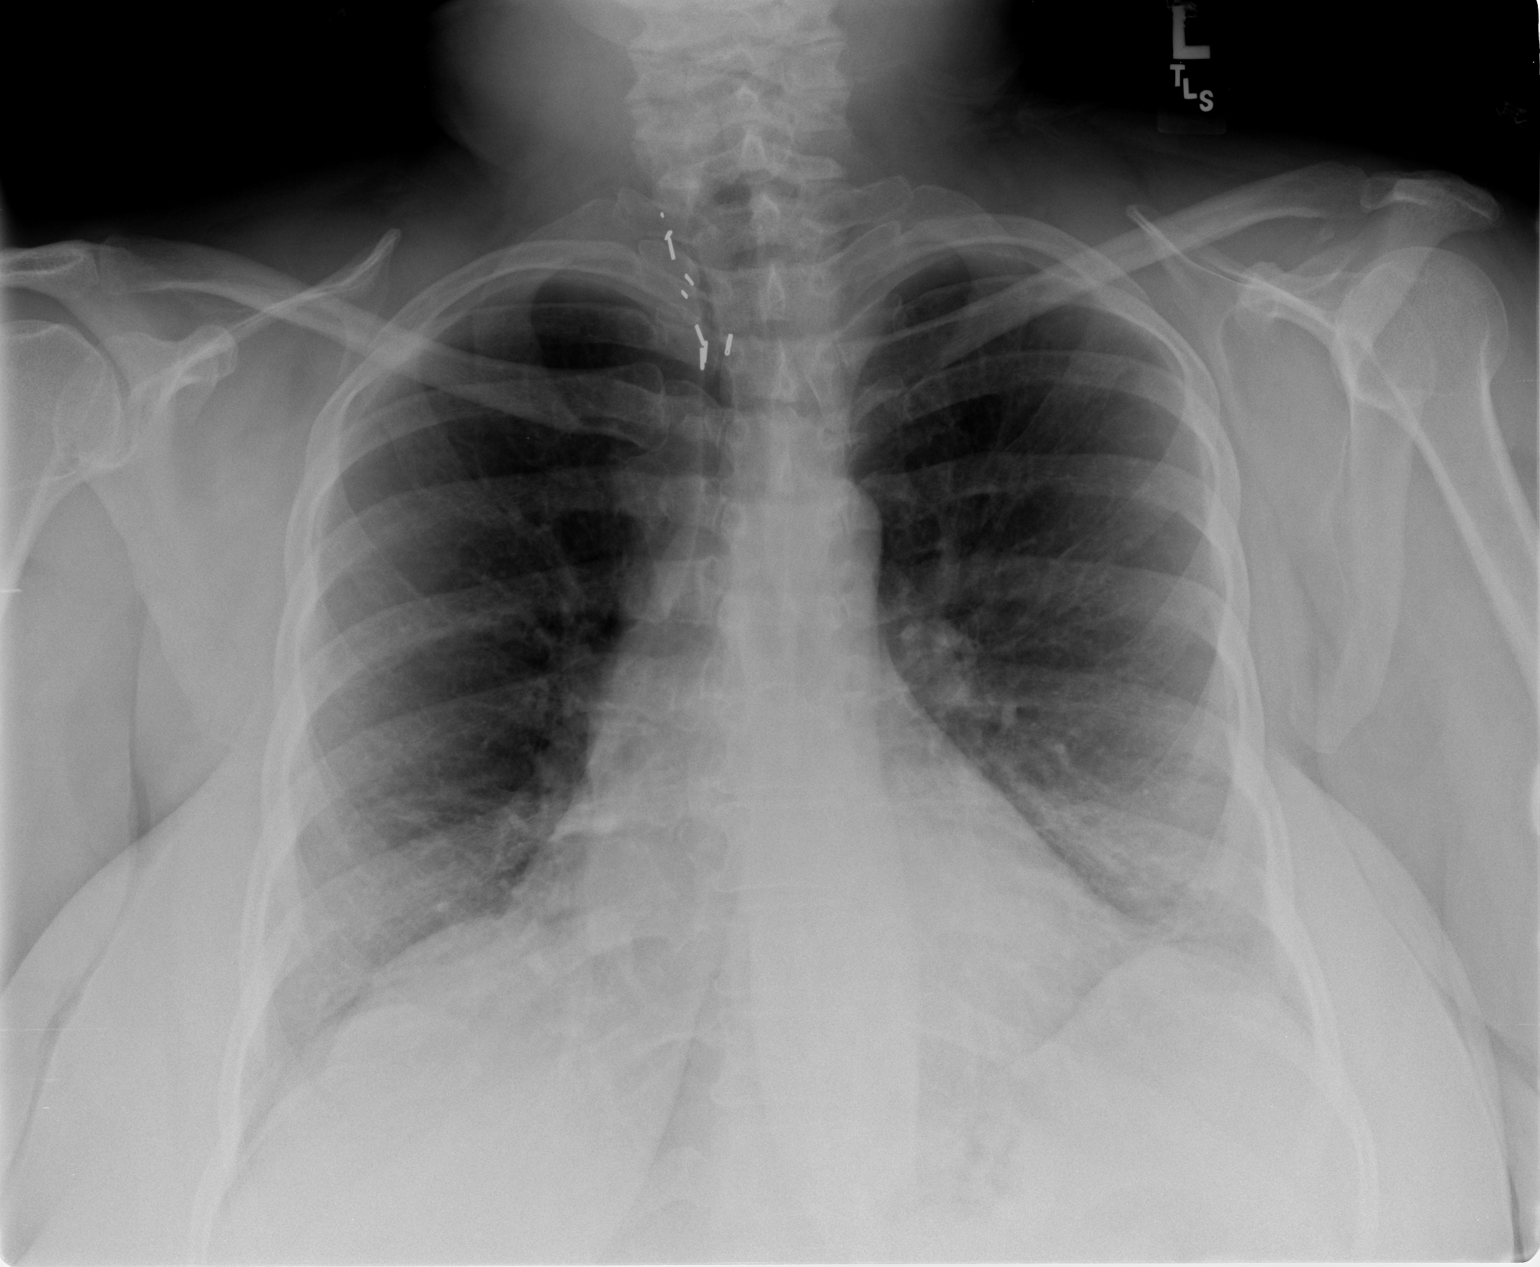

[view not recorded (2 of 2)]
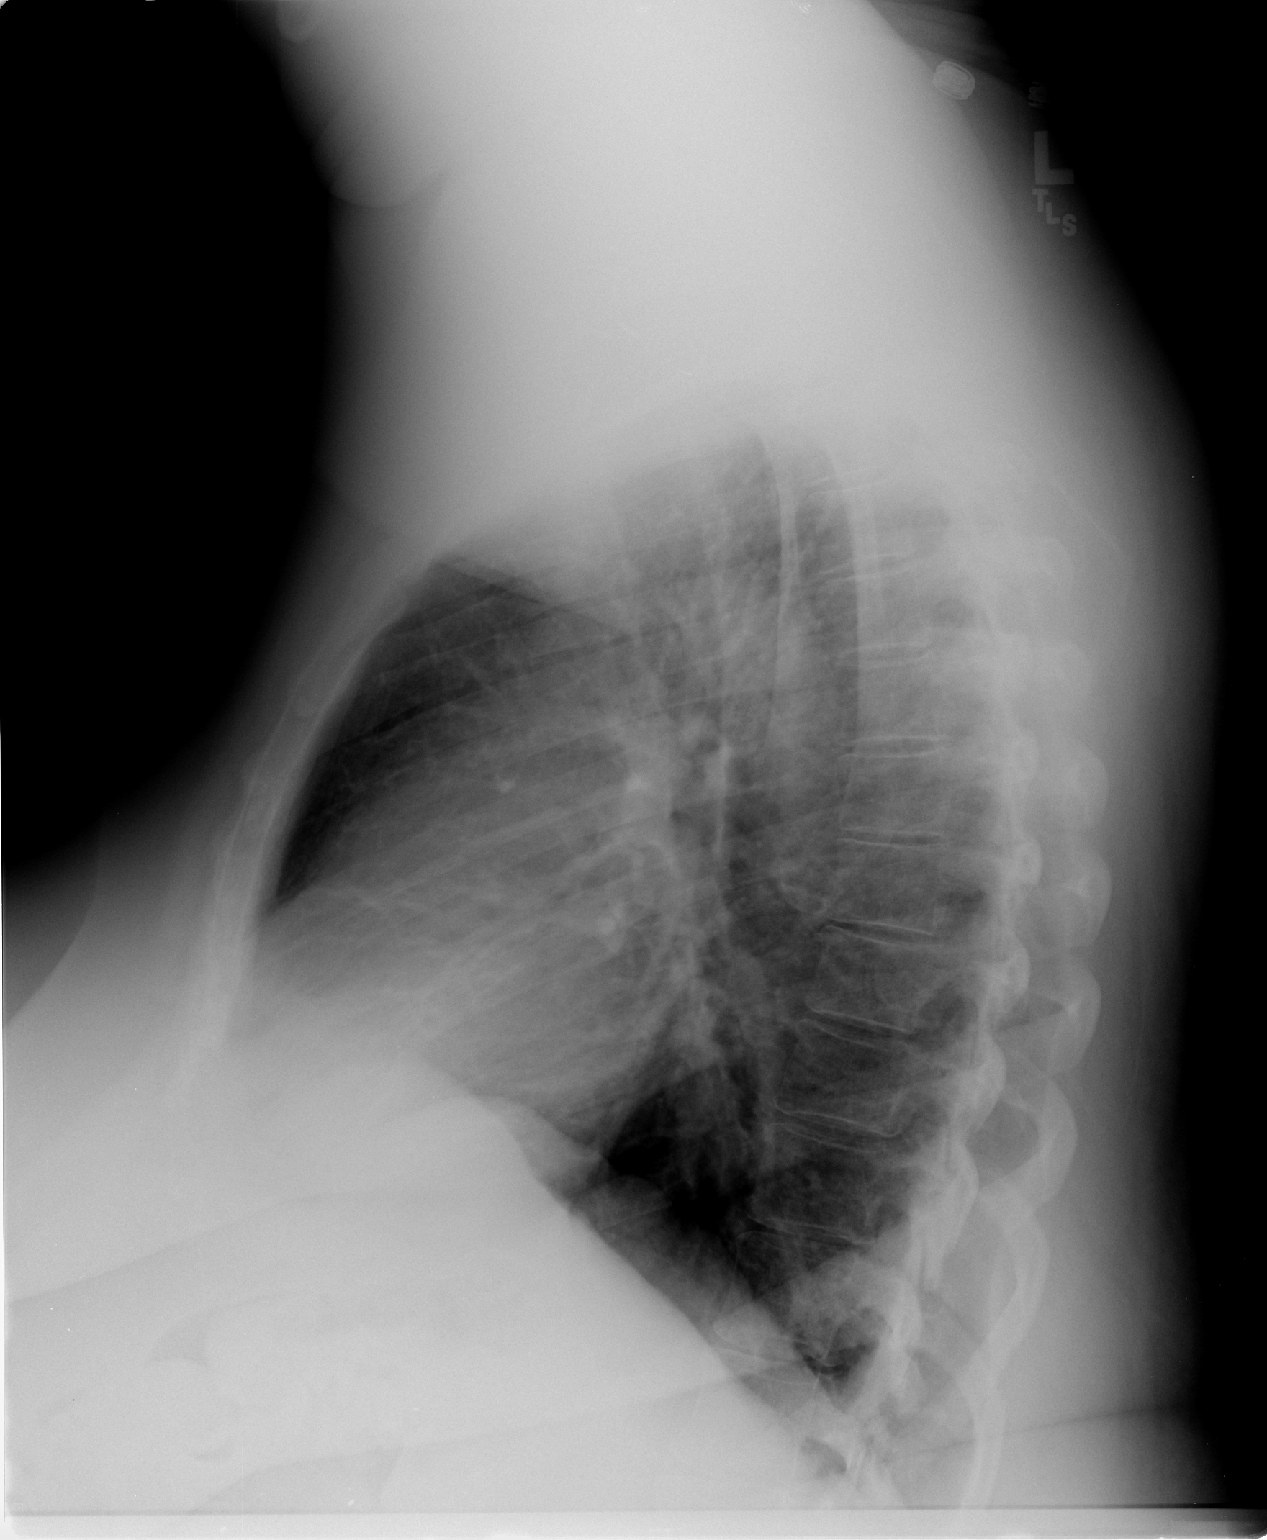

[2 of 2 positions shown; findings below may reference images not displayed]

FINDINGS: The heart is upper normal in size.  There is no heart
failure and the lungs are clear.  There is no infiltrate or
effusion.
IMPRESSION: No active cardiopulmonary disease.

## 2009-01-01 ENCOUNTER — Telehealth: Payer: Self-pay | Admitting: *Deleted

## 2009-01-02 IMAGING — CR DG KNEE 1-2V PORT*R*
2 series · 2 of 2 positions shown · non-contrast
Comparison: None available.

CLINICAL DATA: Right knee replacement.

PORTABLE RIGHT KNEE - 1-2 VIEW

[view not recorded (1 of 2)]
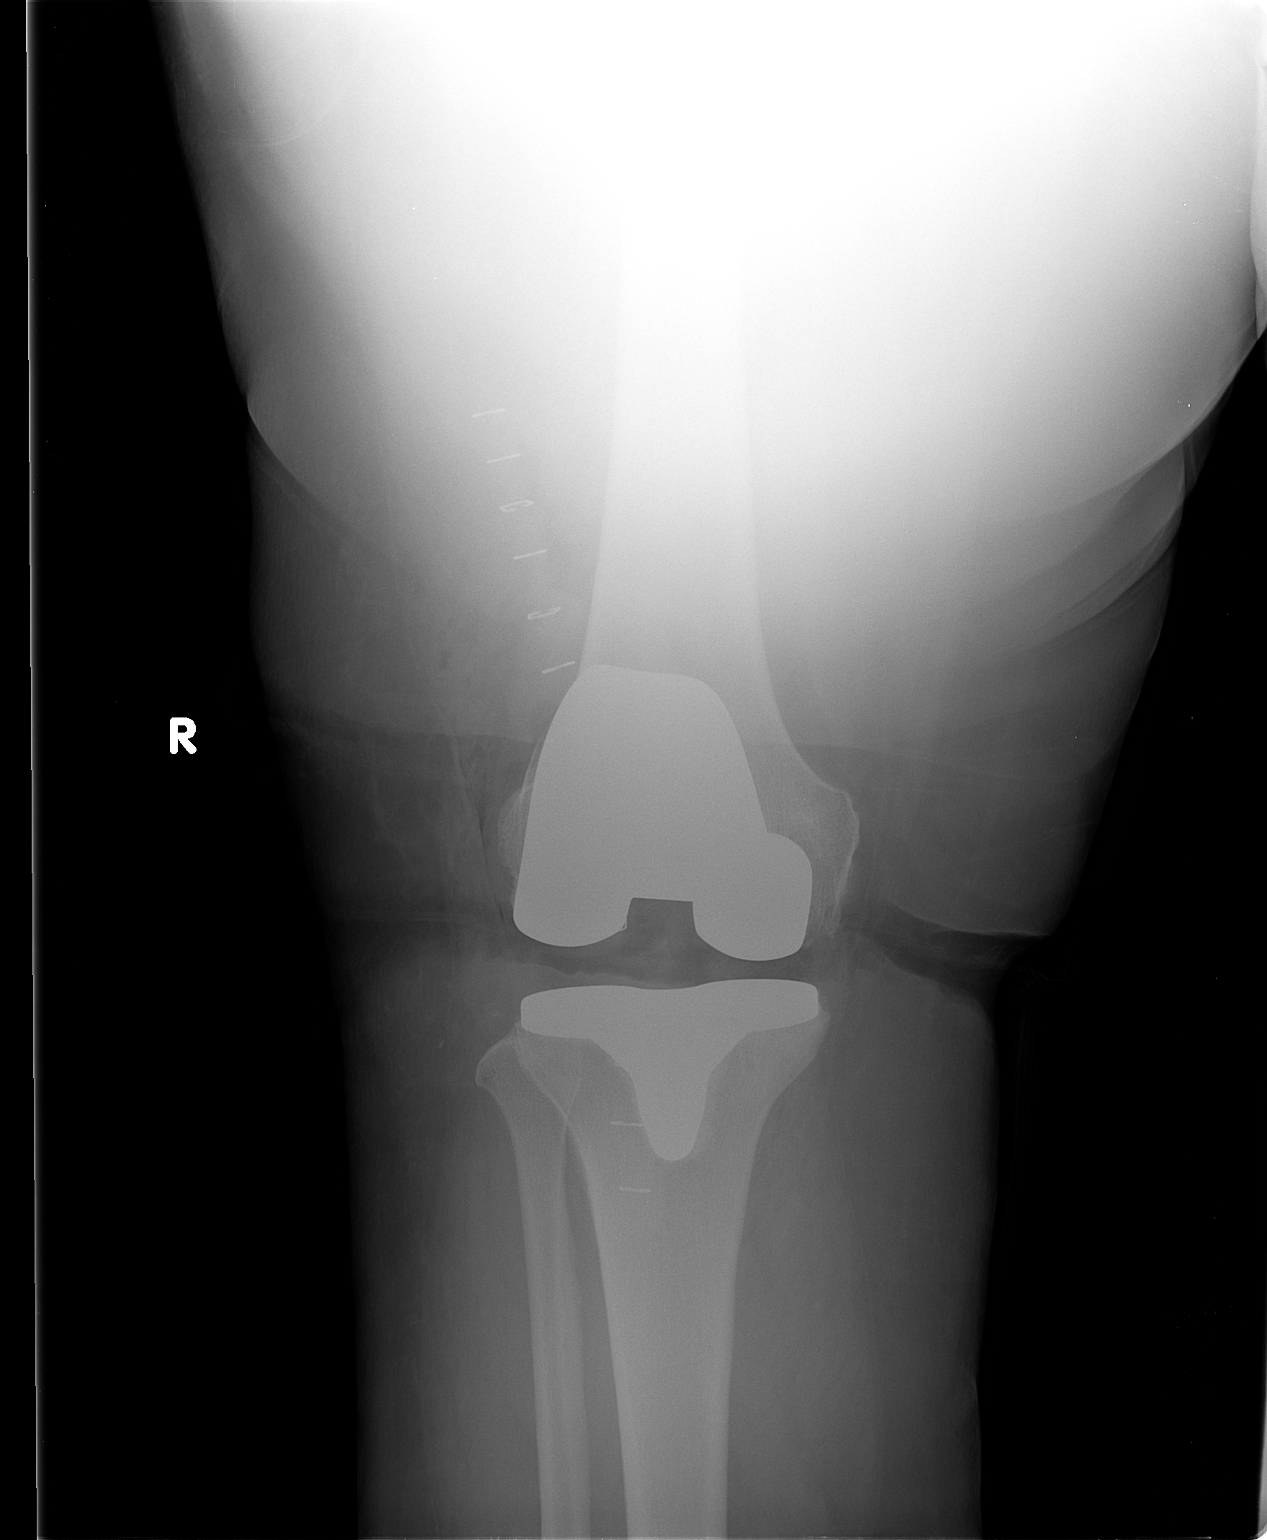

[view not recorded (2 of 2)]
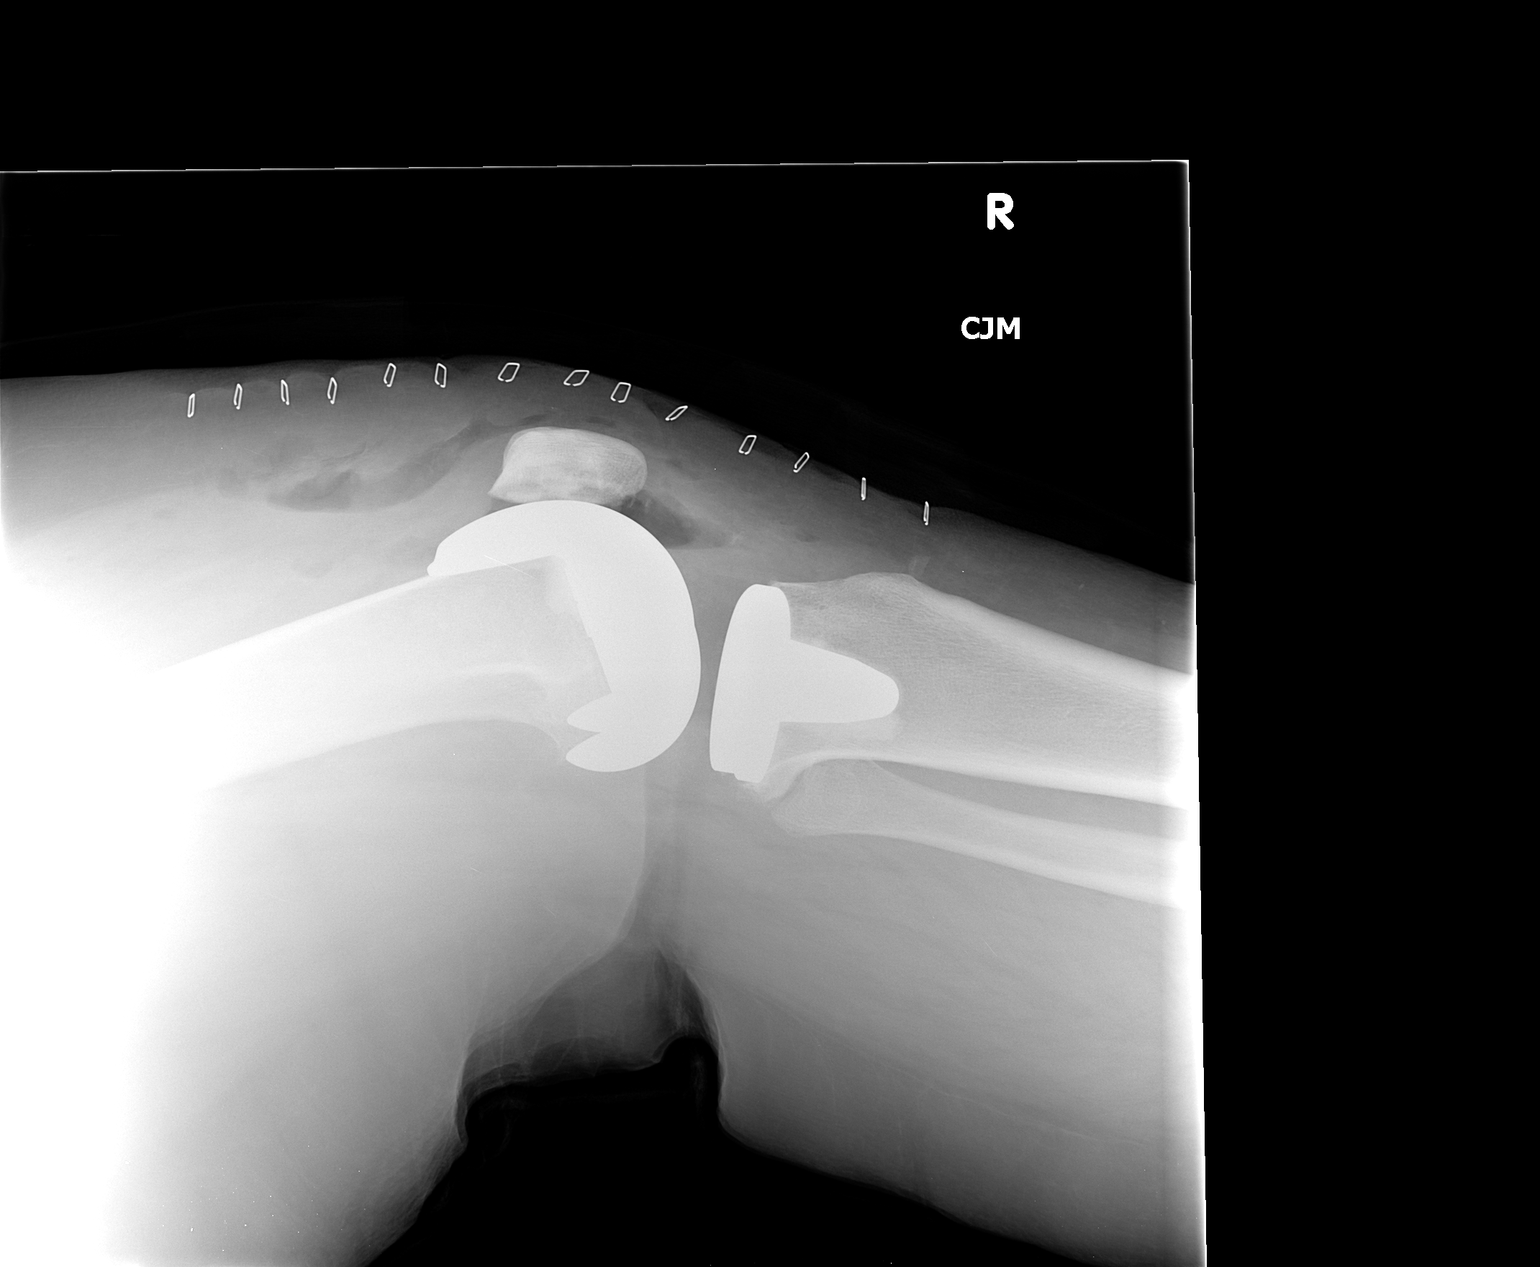

[2 of 2 positions shown; findings below may reference images not displayed]

FINDINGS: Two views the right knee show a total knee arthroplasty
device in place. Device is located there is no fracture.  Surgical
staples are noted.  There is some gas the joint consistent with
postoperative change.
IMPRESSION: Right total knee arthroplasty without evidence of complication.

## 2009-01-03 IMAGING — CR DG ABDOMEN ACUTE W/ 1V CHEST
2 series · 2 of 2 positions shown · non-contrast
Comparison: None

CLINICAL DATA: Abdominal pain.  Postop from right total knee
replacement.

ACUTE ABDOMEN SERIES (ABDOMEN 2 VIEW & CHEST 1 VIEW)

[w abdomen decub * (1 of 2)]
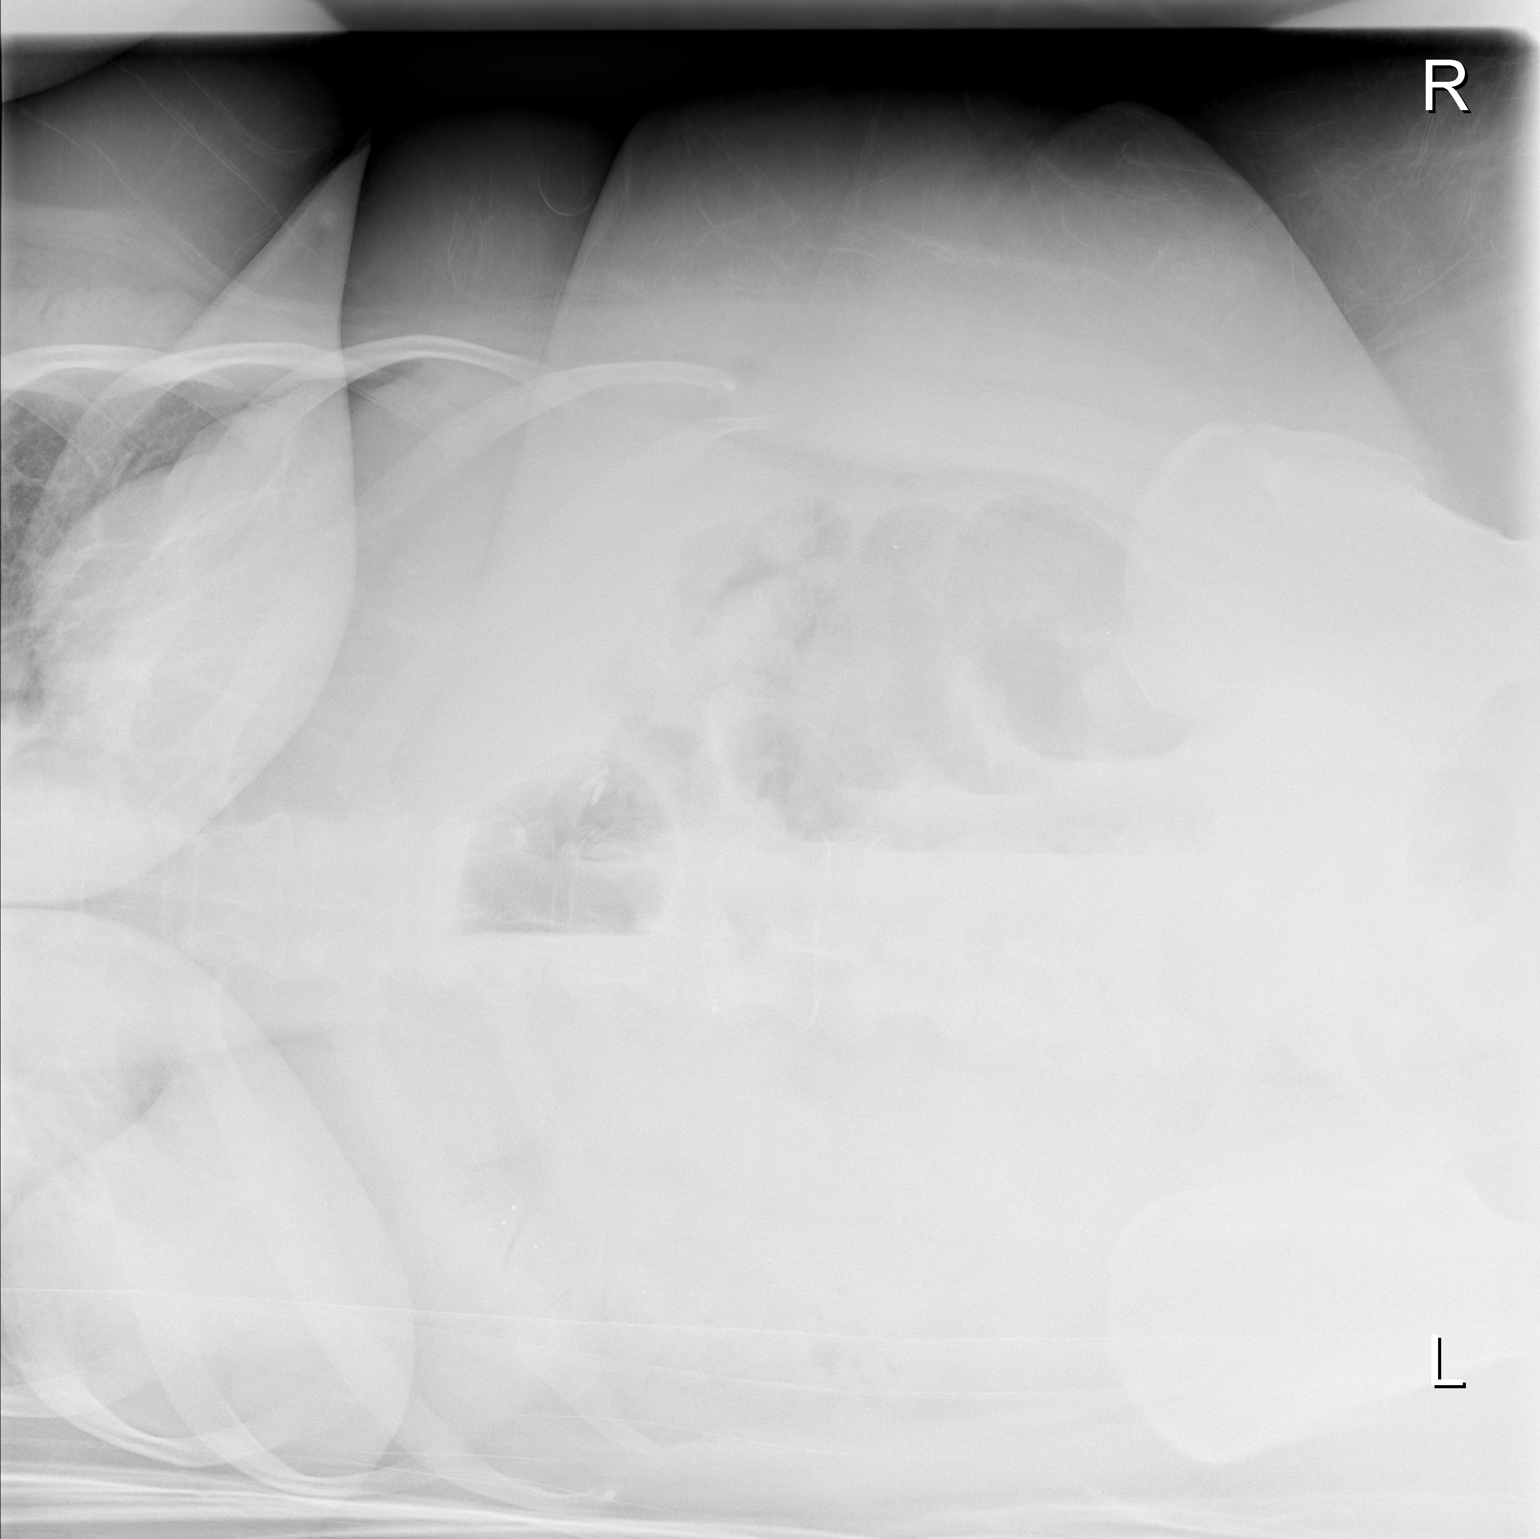

[w abdomen decub * (2 of 2)]
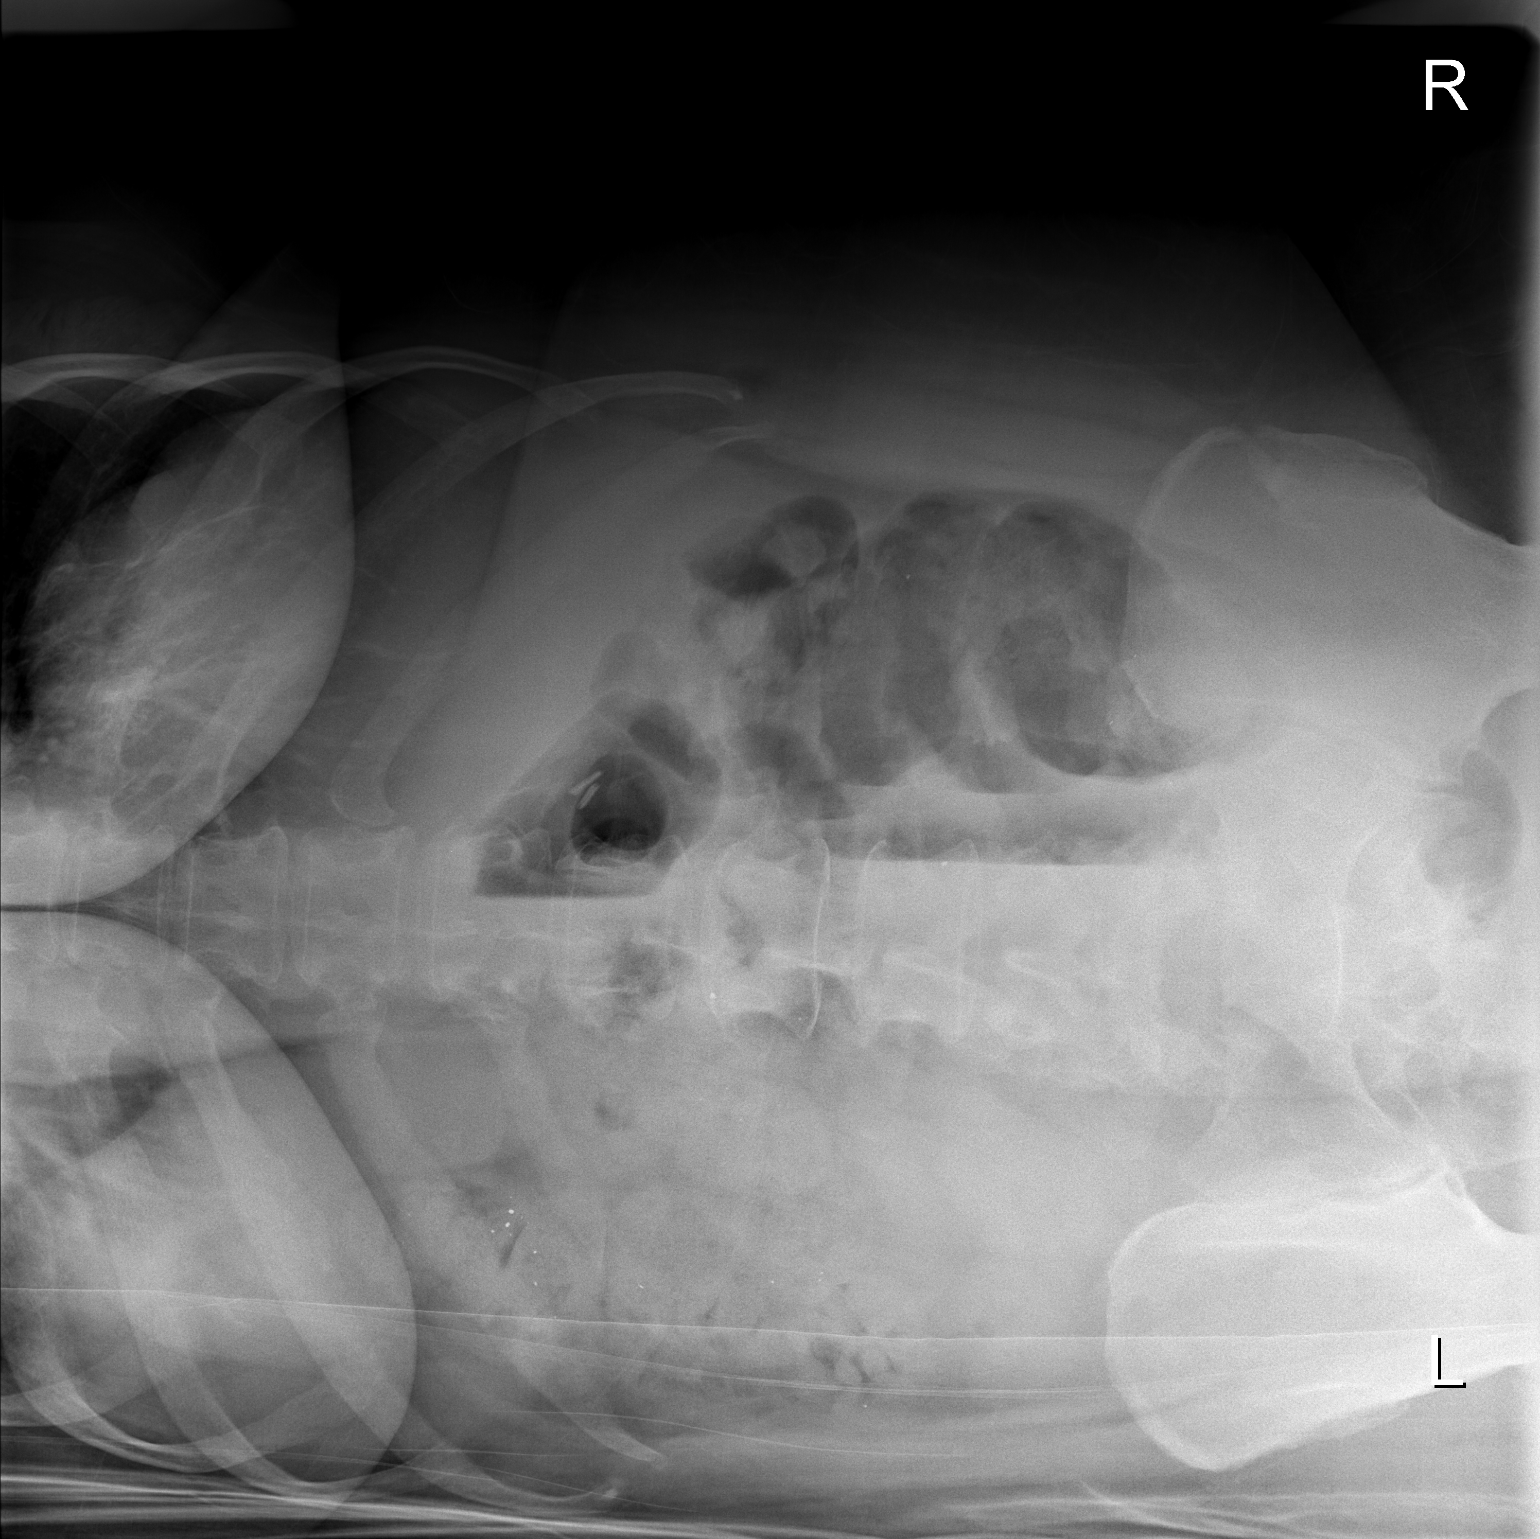

[2 of 2 positions shown; findings below may reference images not displayed]

FINDINGS: Mild gaseous distension of colon is seen, and several Air
fluid levels are noted the right colon on the decubitus view.  This
is consistent with mild postop ileus.  There is no evidence of free
intraperitoneal air.

Mild atelectasis is seen in both lung bases.  There is no evidence
of pulmonary consolidation or edema.  Heart size is normal.
Surgical clips are seen within the superior mediastinum.
IMPRESSION: 1.  Mild colonic ileus.
2.  Mild bibasilar atelectasis.

## 2009-01-04 IMAGING — CR DG CHEST 1V PORT
1 series · 1 of 1 positions shown · non-contrast
Comparison: 07/02/2007, 07/09/2007

CLINICAL DATA: Osteoarthritis, lethargy, fluid overload

PORTABLE CHEST - 1 VIEW

[view not recorded]
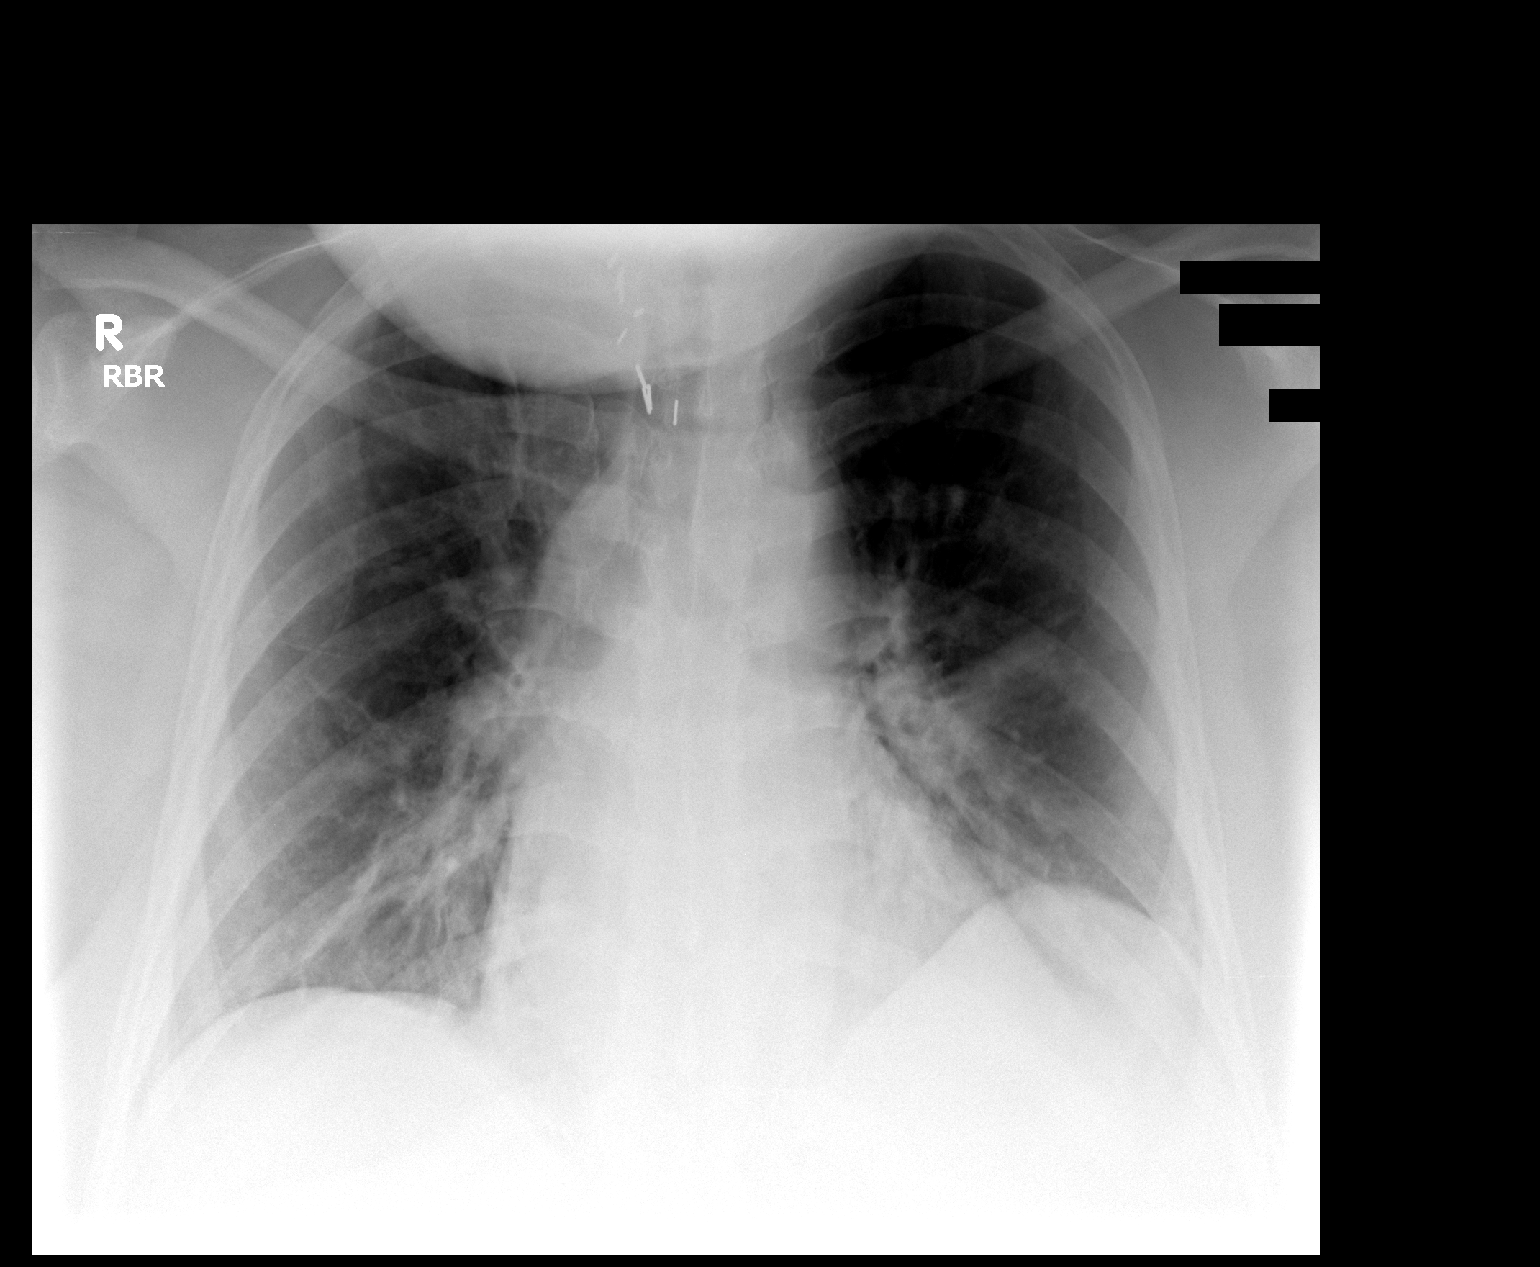

[1 of 1 positions shown; findings below may reference images not displayed]

FINDINGS: Exam is limited because of portable technique and the
head positioned over the lung apices.  The heart remains enlarged
with increased vascular congestion and perihilar atelectasis.  Left
hemidiaphragm remains elevated.  No large effusion or pneumothorax.
IMPRESSION: Slight increase in central vascular congestion and atelectasis.

## 2009-01-04 IMAGING — US US RENAL
1 series · 14 of 20 positions shown · non-contrast
Comparison: Abdominal CT 08/05/2005.

CLINICAL DATA: Acute renal failure.

RENAL/URINARY TRACT ULTRASOUND
TECHNIQUE: Complete ultrasound examination of the urinary tract
was performed including evaluation of the kidneys renal collecting
systems and urinary bladder.

[Series 1: unknown · 0.35mm/px · 14 of 20 slices shown]
[im 1/20]
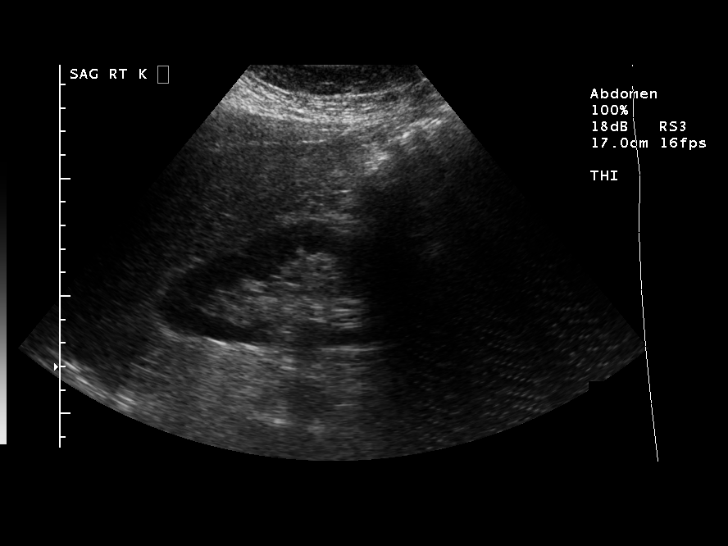
[im 3/20]
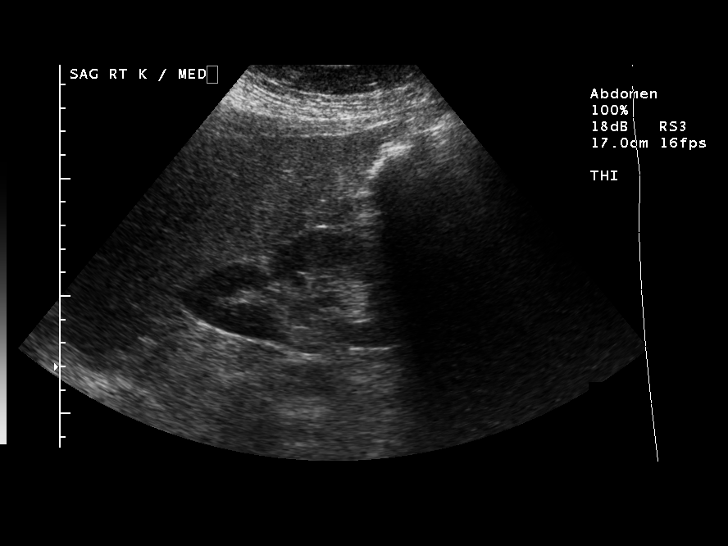
[im 4/20]
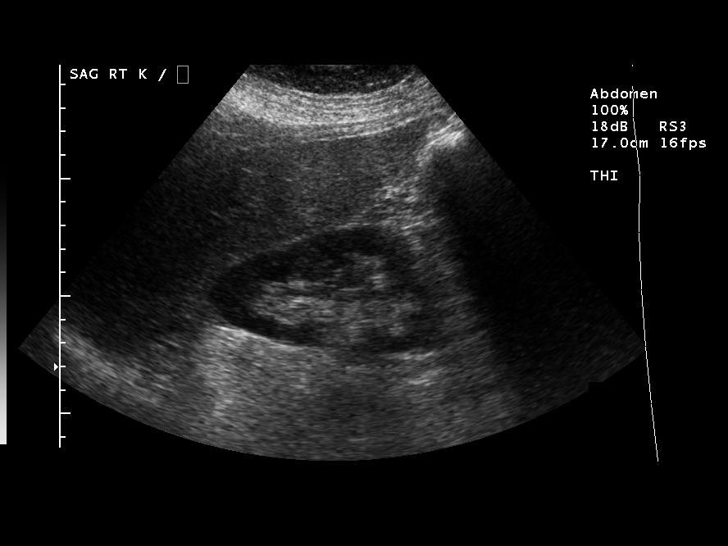
[im 6/20]
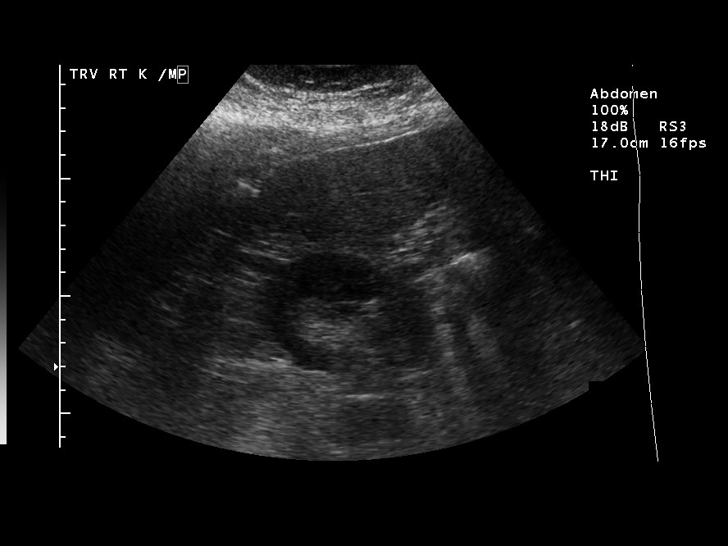
[im 7/20]
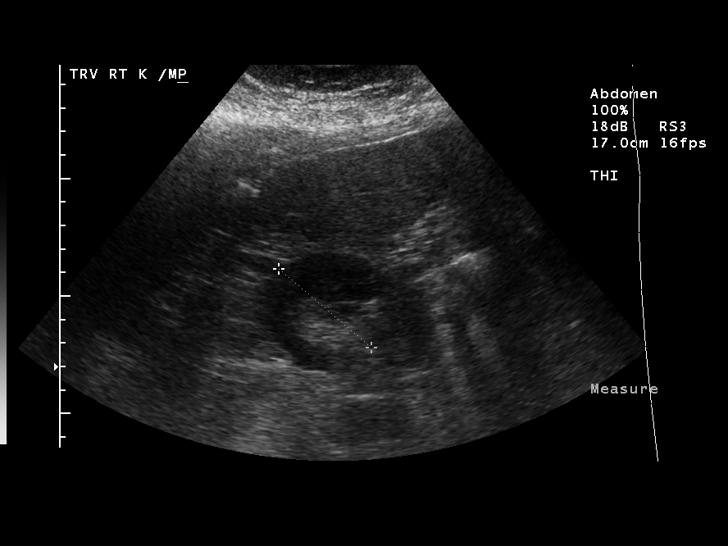
[im 8/20]
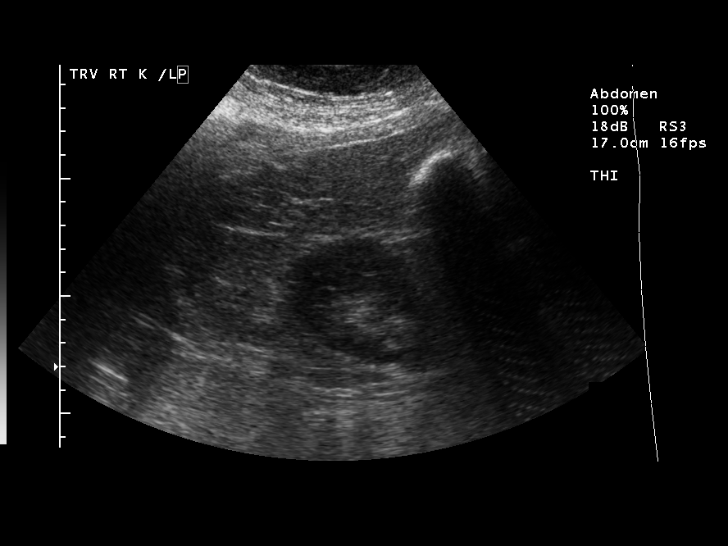
[im 10/20]
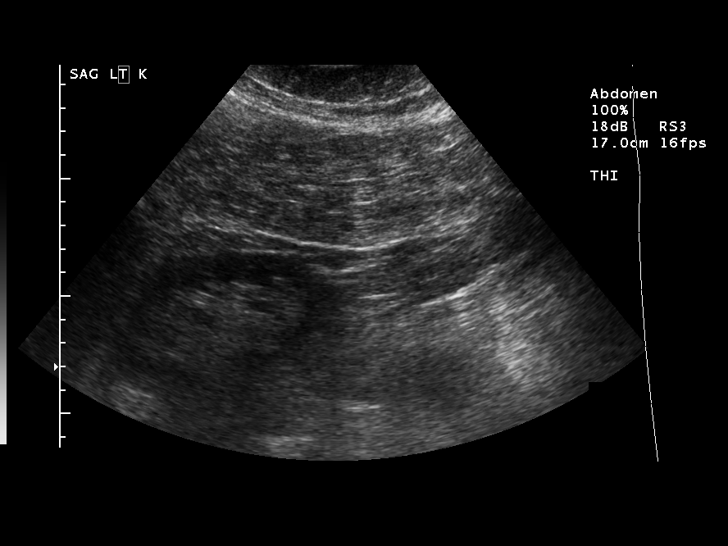
[im 11/20]
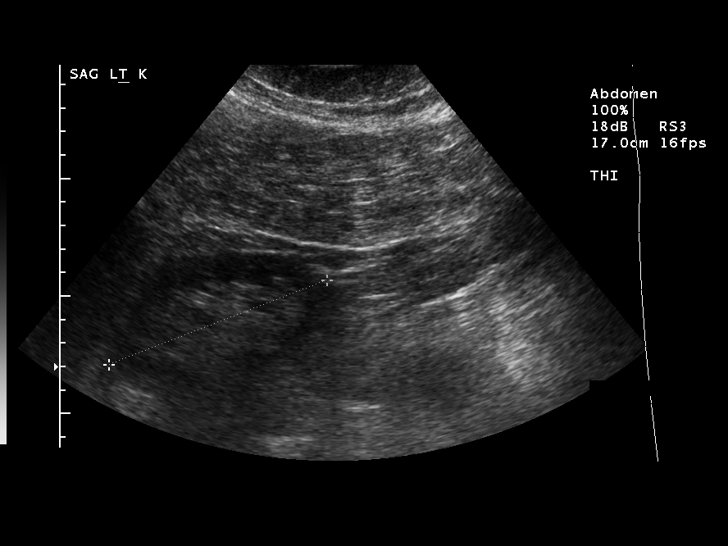
[im 13/20]
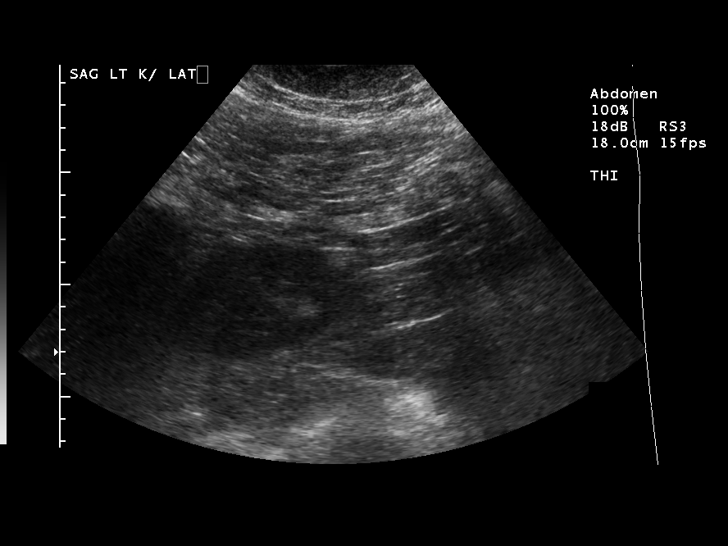
[im 14/20]
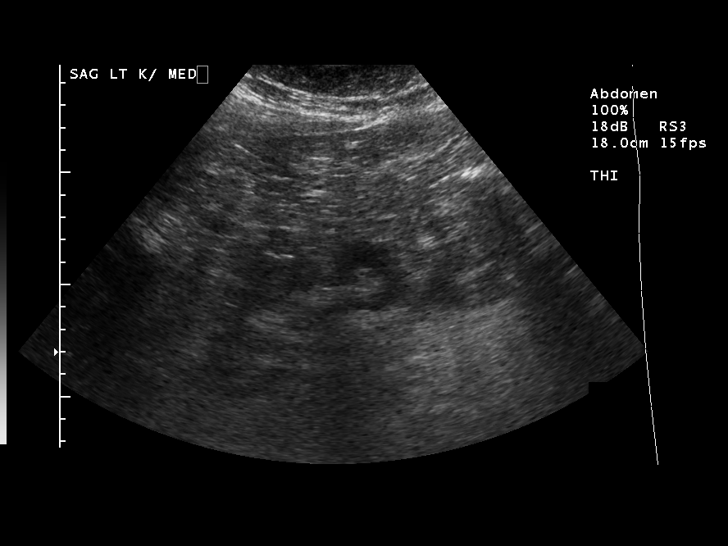
[im 16/20]
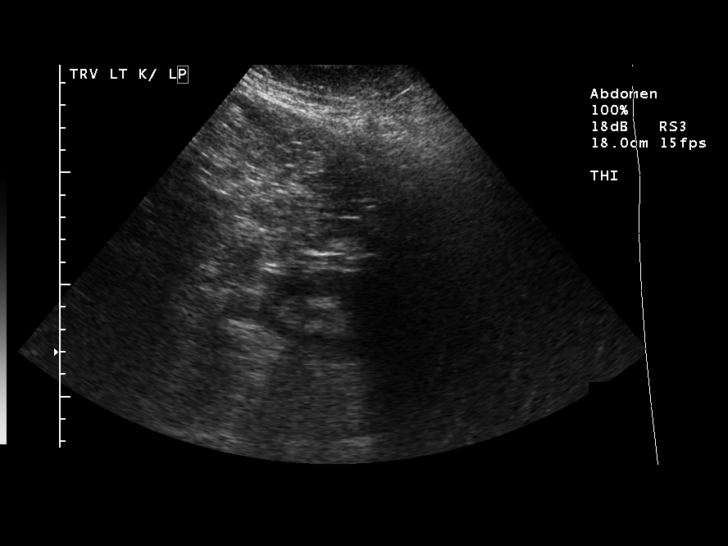
[im 17/20]
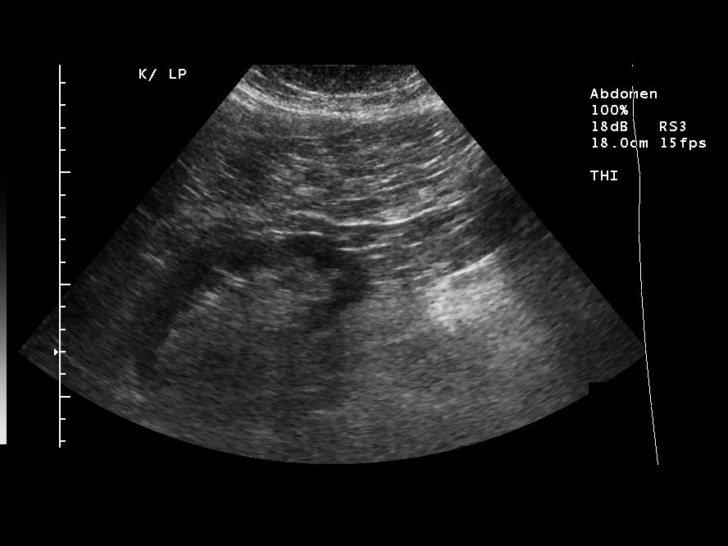
[im 18/20]
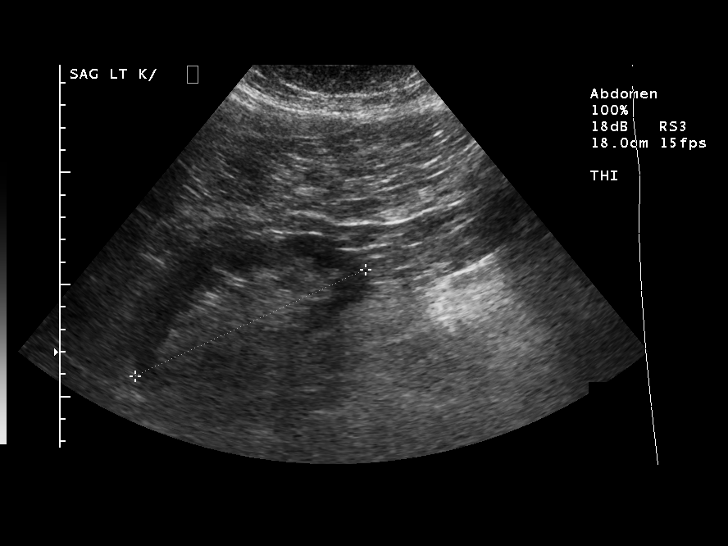
[im 20/20]
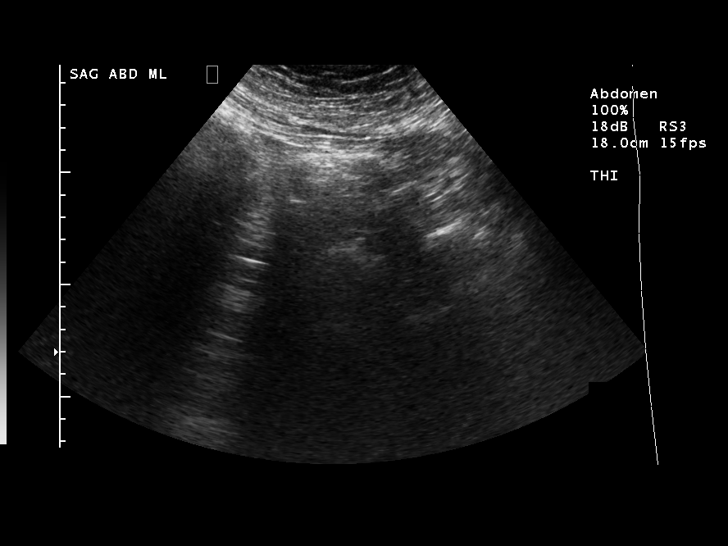

[14 of 20 positions shown; findings below may reference images not displayed]

FINDINGS: Both kidneys appear normal in size and morphology.  There
is no hydronephrosis.  Right kidney measures 11.4 cm in length and
the left kidney 11.3 cm.  The bladder is decompressed by a Foley
catheter.
IMPRESSION: Normal renal ultrasound.  No hydronephrosis.

## 2009-01-04 IMAGING — CR DG CHEST 1V PORT
1 series · 1 of 1 positions shown · non-contrast
Comparison: Earlier the same date.

CLINICAL DATA: Osteoarthritis.  Line placement.

PORTABLE CHEST - 1 VIEW

[view not recorded]
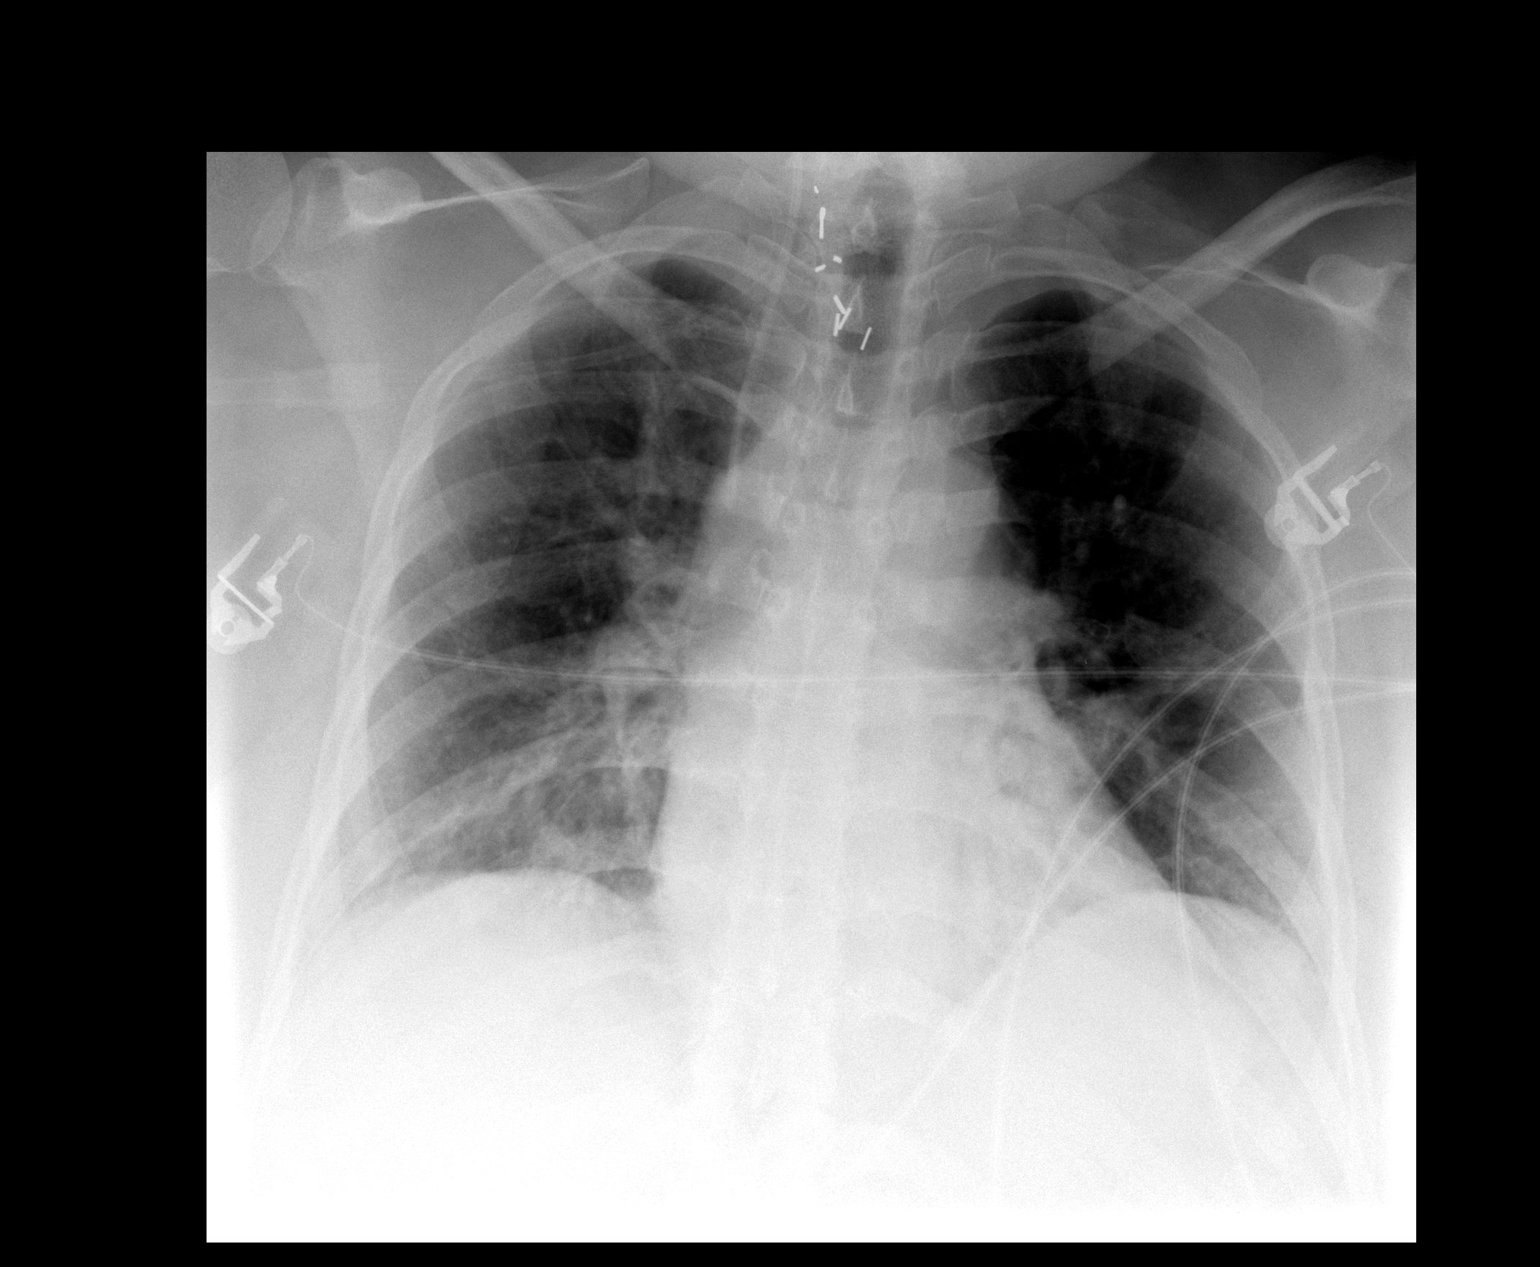

[1 of 1 positions shown; findings below may reference images not displayed]

FINDINGS: Examination 2544 hours.  There has been interval
placement of a right IJ central venous catheter with its tip in the
SVC.  There is no pneumothorax.  Patchy perihilar and basilar
scarring or atelectasis appears stable.  The heart size and
mediastinal contours are unchanged.
IMPRESSION: Central line placement as described.  No demonstrated complication.

## 2009-01-05 IMAGING — NM NM PULM PERFUSION & VENT (REBREATHING & WASHOUT)
2 series · 12 of 12 positions shown · non-contrast
Comparison: Chest radiograph on 07/10/2007

CLINICAL DATA: Shortness of breath and tachycardia.  Postop from
knee replacement surgery.  Renal failure.

NUCLEAR MEDICINE VENTILATION AND PERFUSION SCAN
TECHNIQUE: Perfusion images were obtained in multiple projections
after intravenous injection of radiopharmaceutical. Sequential
dynamic ventilation images were obtained in standard planar
projections following inhalation of radiopharmaceutical.
Radiopharmaceutical: 10 mCi 33 xenon and 6 mCi technetium 99m MAA

[Series 1: vq lung vent perf · 2.54mm/px · 6 of 16 frames shown (1 of 2)]
[frame 2/16  full-range]
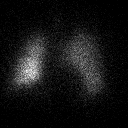
[frame 4/16  full-range]
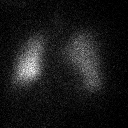
[frame 7/16  full-range]
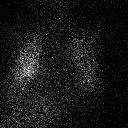
[frame 10/16  full-range]
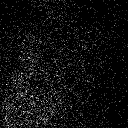
[frame 12/16  full-range]
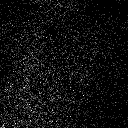
[frame 15/16  full-range]
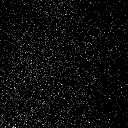

[Series 1: vq lung vent perf · 2.54mm/px · 6 of 16 frames shown (2 of 2)]
[frame 2/16  full-range]
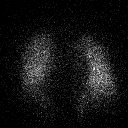
[frame 4/16  full-range]
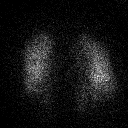
[frame 7/16  full-range]
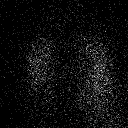
[frame 10/16]
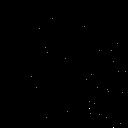
[frame 12/16]
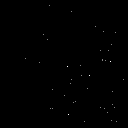
[frame 15/16]
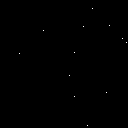

[12 of 12 positions shown; findings below may reference images not displayed]

FINDINGS: Uniform distribution of xenon activity is seen throughout
both lungs on Jeff equilibrium phase images.  Prompt washout
of xenon activity is seen from both lungs.  Washout phase.

The perfusion images show no segmental pulmonary perfusion defects
in either lung.
IMPRESSION: Negative.  No evidence of pulmonary embolism.

## 2009-01-08 ENCOUNTER — Telehealth (INDEPENDENT_AMBULATORY_CARE_PROVIDER_SITE_OTHER): Payer: Self-pay | Admitting: Internal Medicine

## 2009-01-10 ENCOUNTER — Telehealth: Payer: Self-pay | Admitting: *Deleted

## 2009-01-13 IMAGING — CR DG CHEST 2V
2 series · 2 of 2 positions shown · non-contrast
Comparison: 07/10/2007

CLINICAL DATA: Short of breath.  Hypertension.

CHEST - 2 VIEW

[w chest pa]
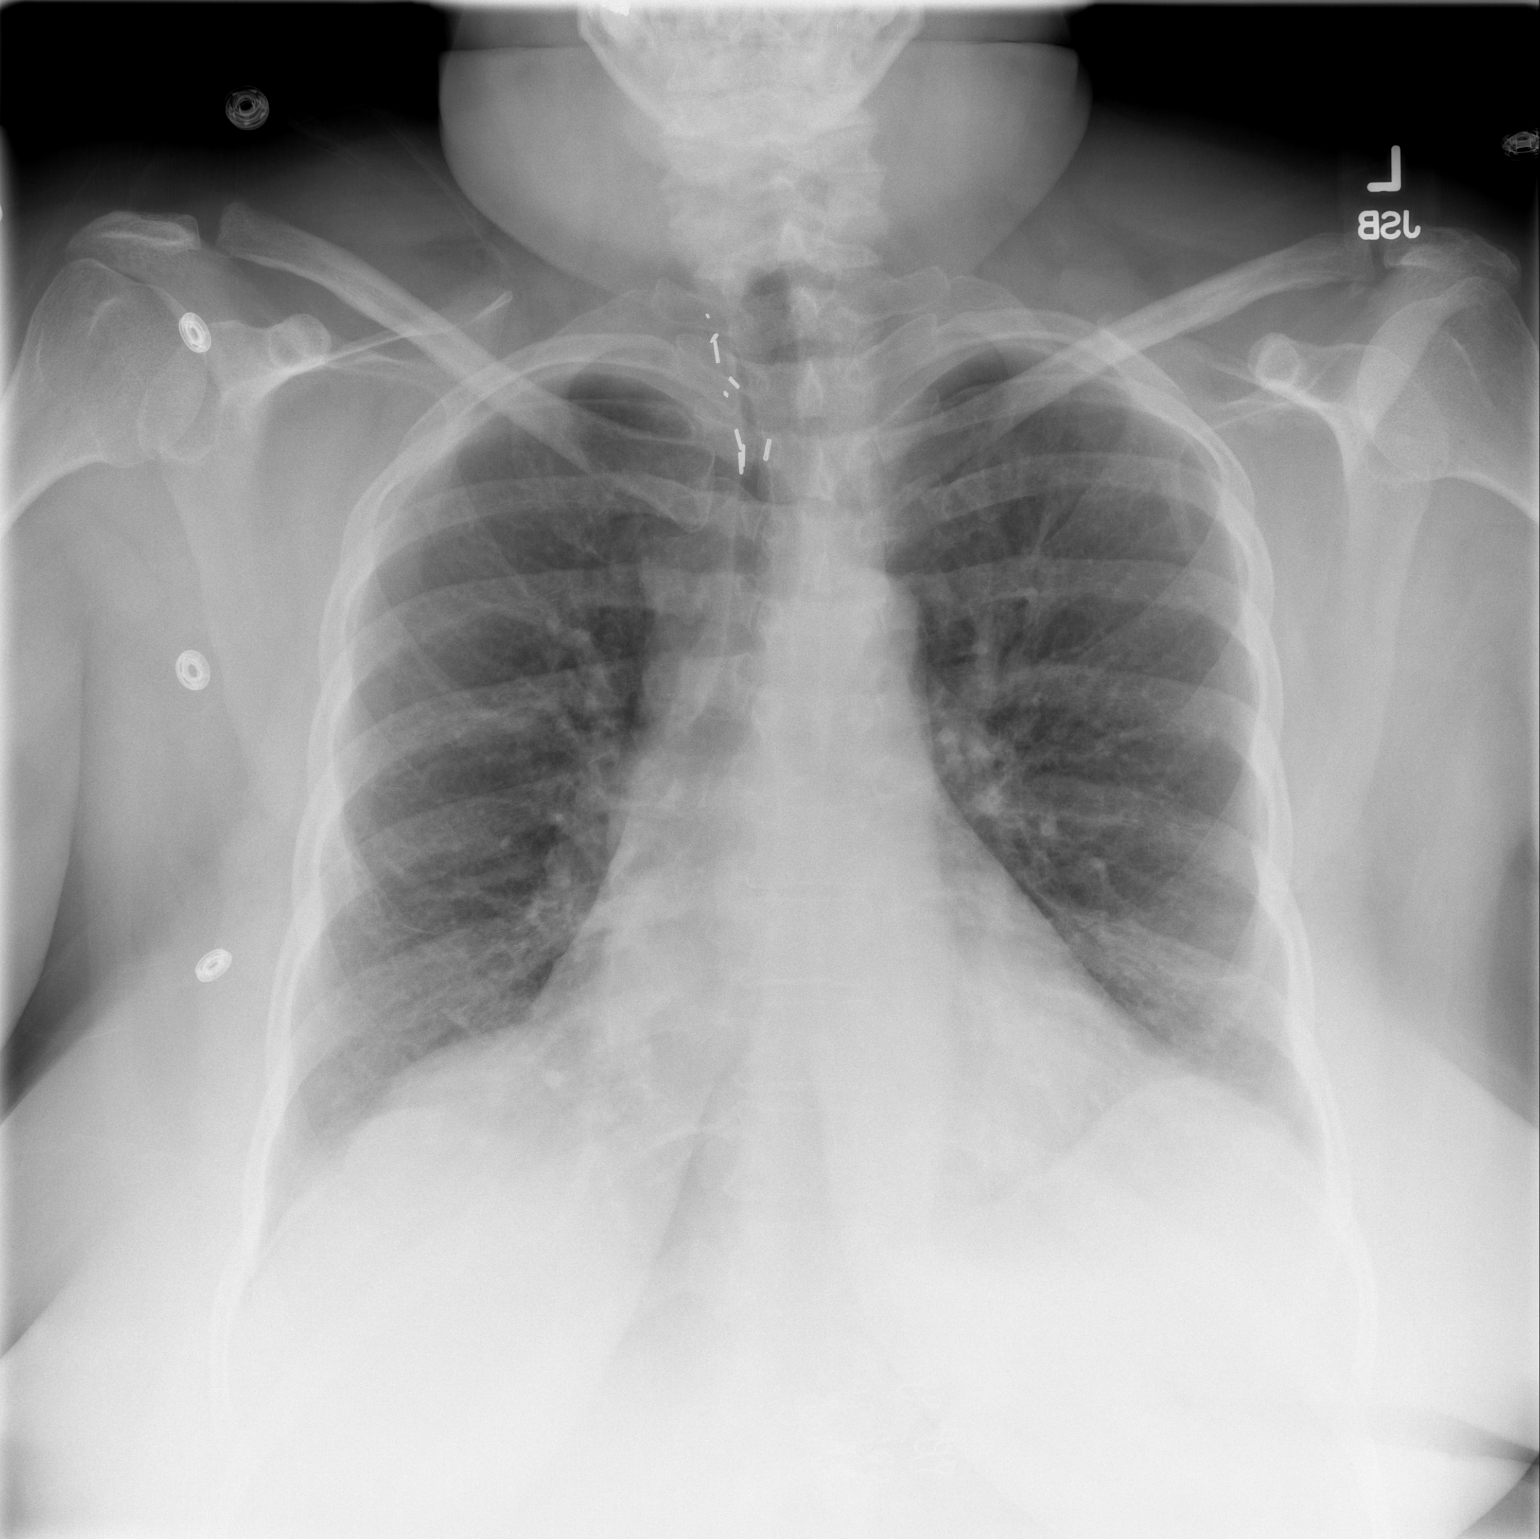

[w chest lat]
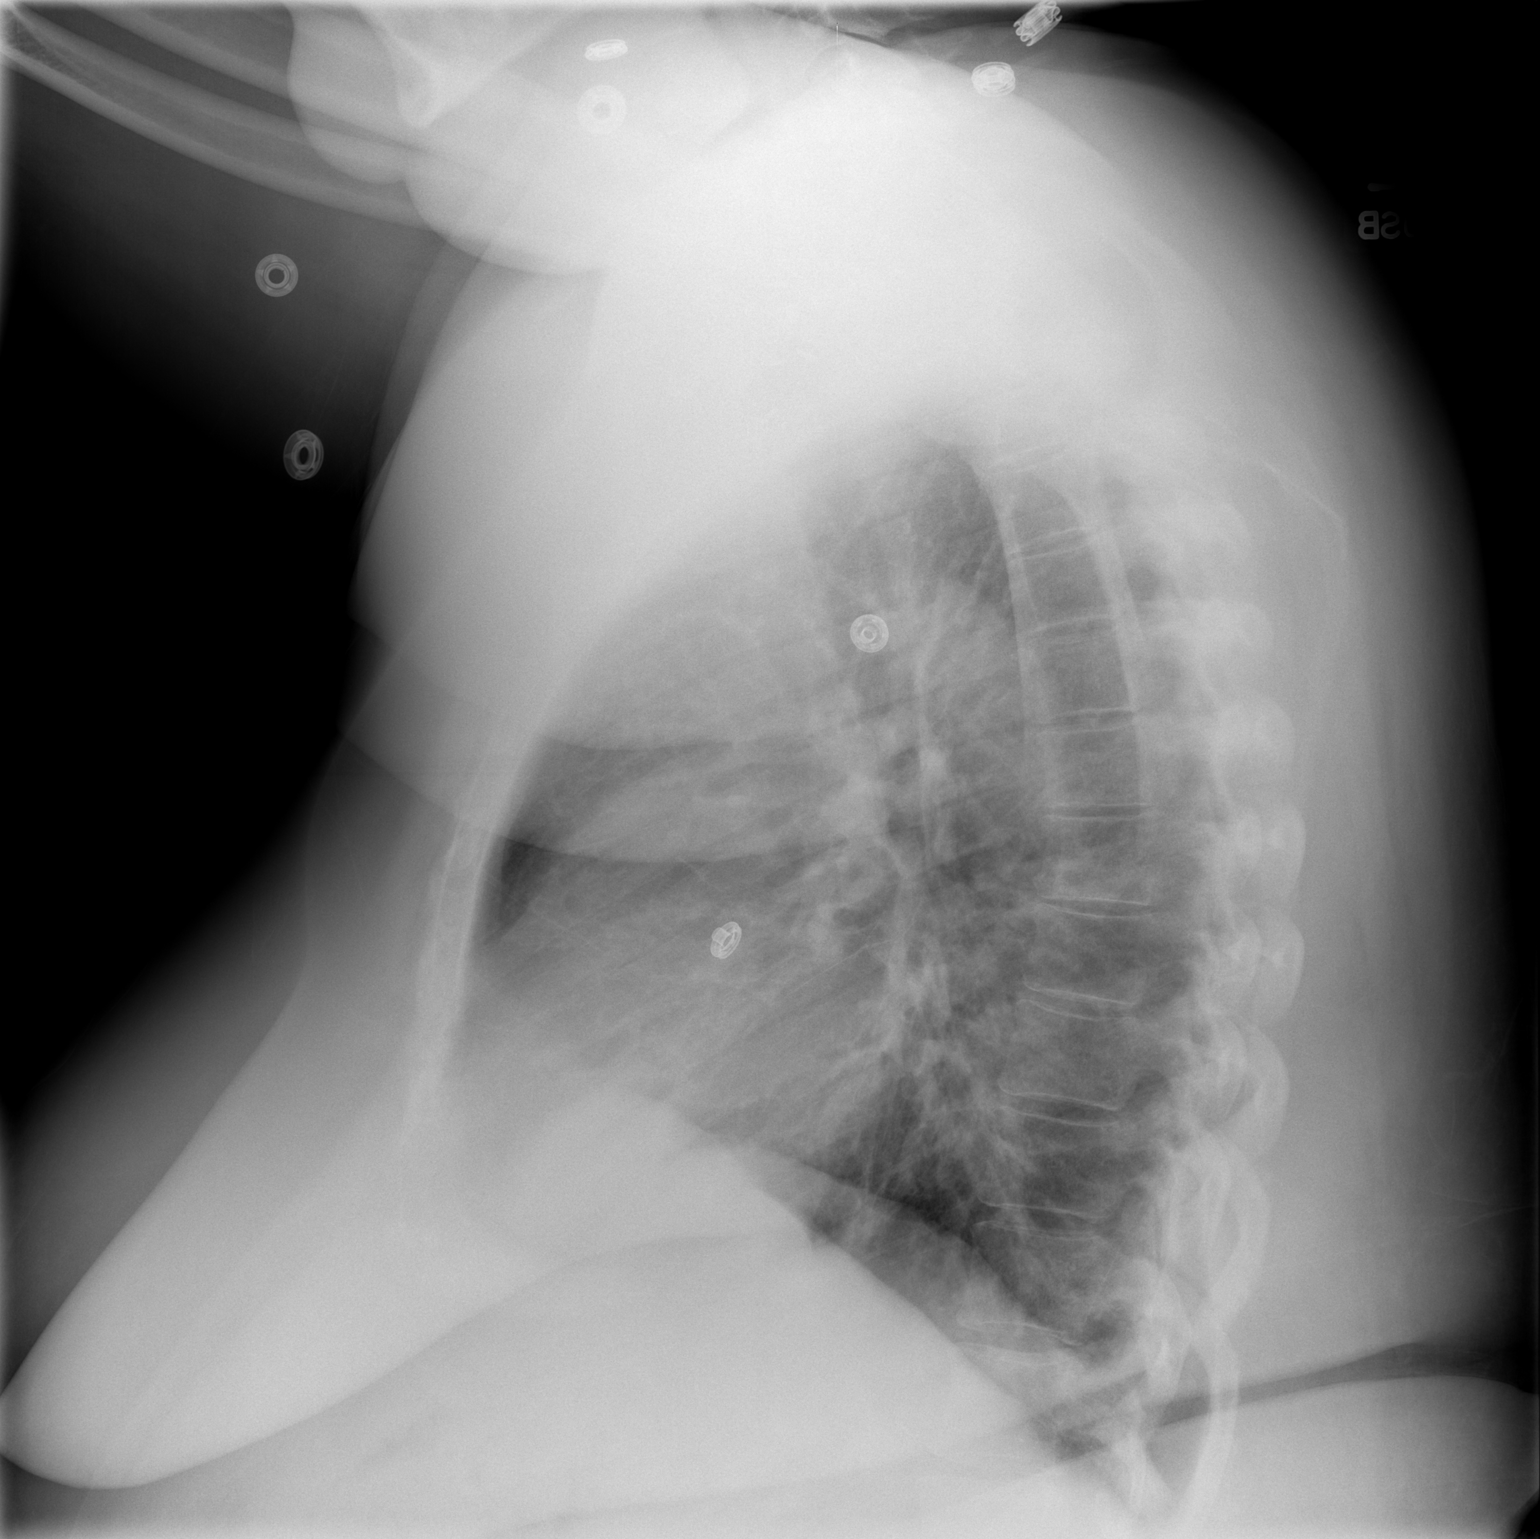

[2 of 2 positions shown; findings below may reference images not displayed]

FINDINGS: The heart is at the upper limits of normal in size.
There are surgical clips at the thoracic inlet on the right.  There
are chronically increased interstitial lung markings, but no
suspicion of active infiltrate, mass, effusion or collapse.  Bony
structures are unremarkable.
IMPRESSION: Chronic lung markings.  No identifiably acute abnormality.

## 2009-02-05 ENCOUNTER — Ambulatory Visit: Payer: Self-pay | Admitting: Internal Medicine

## 2009-02-06 IMAGING — CR DG KNEE 1-2V PORT*R*
2 series · 2 of 2 positions shown · non-contrast
Comparison: 07/08/2007

CLINICAL DATA: Dislocated right total knee replacement.

PORTABLE RIGHT KNEE - 1-2 VIEW

[ap/obl knee]
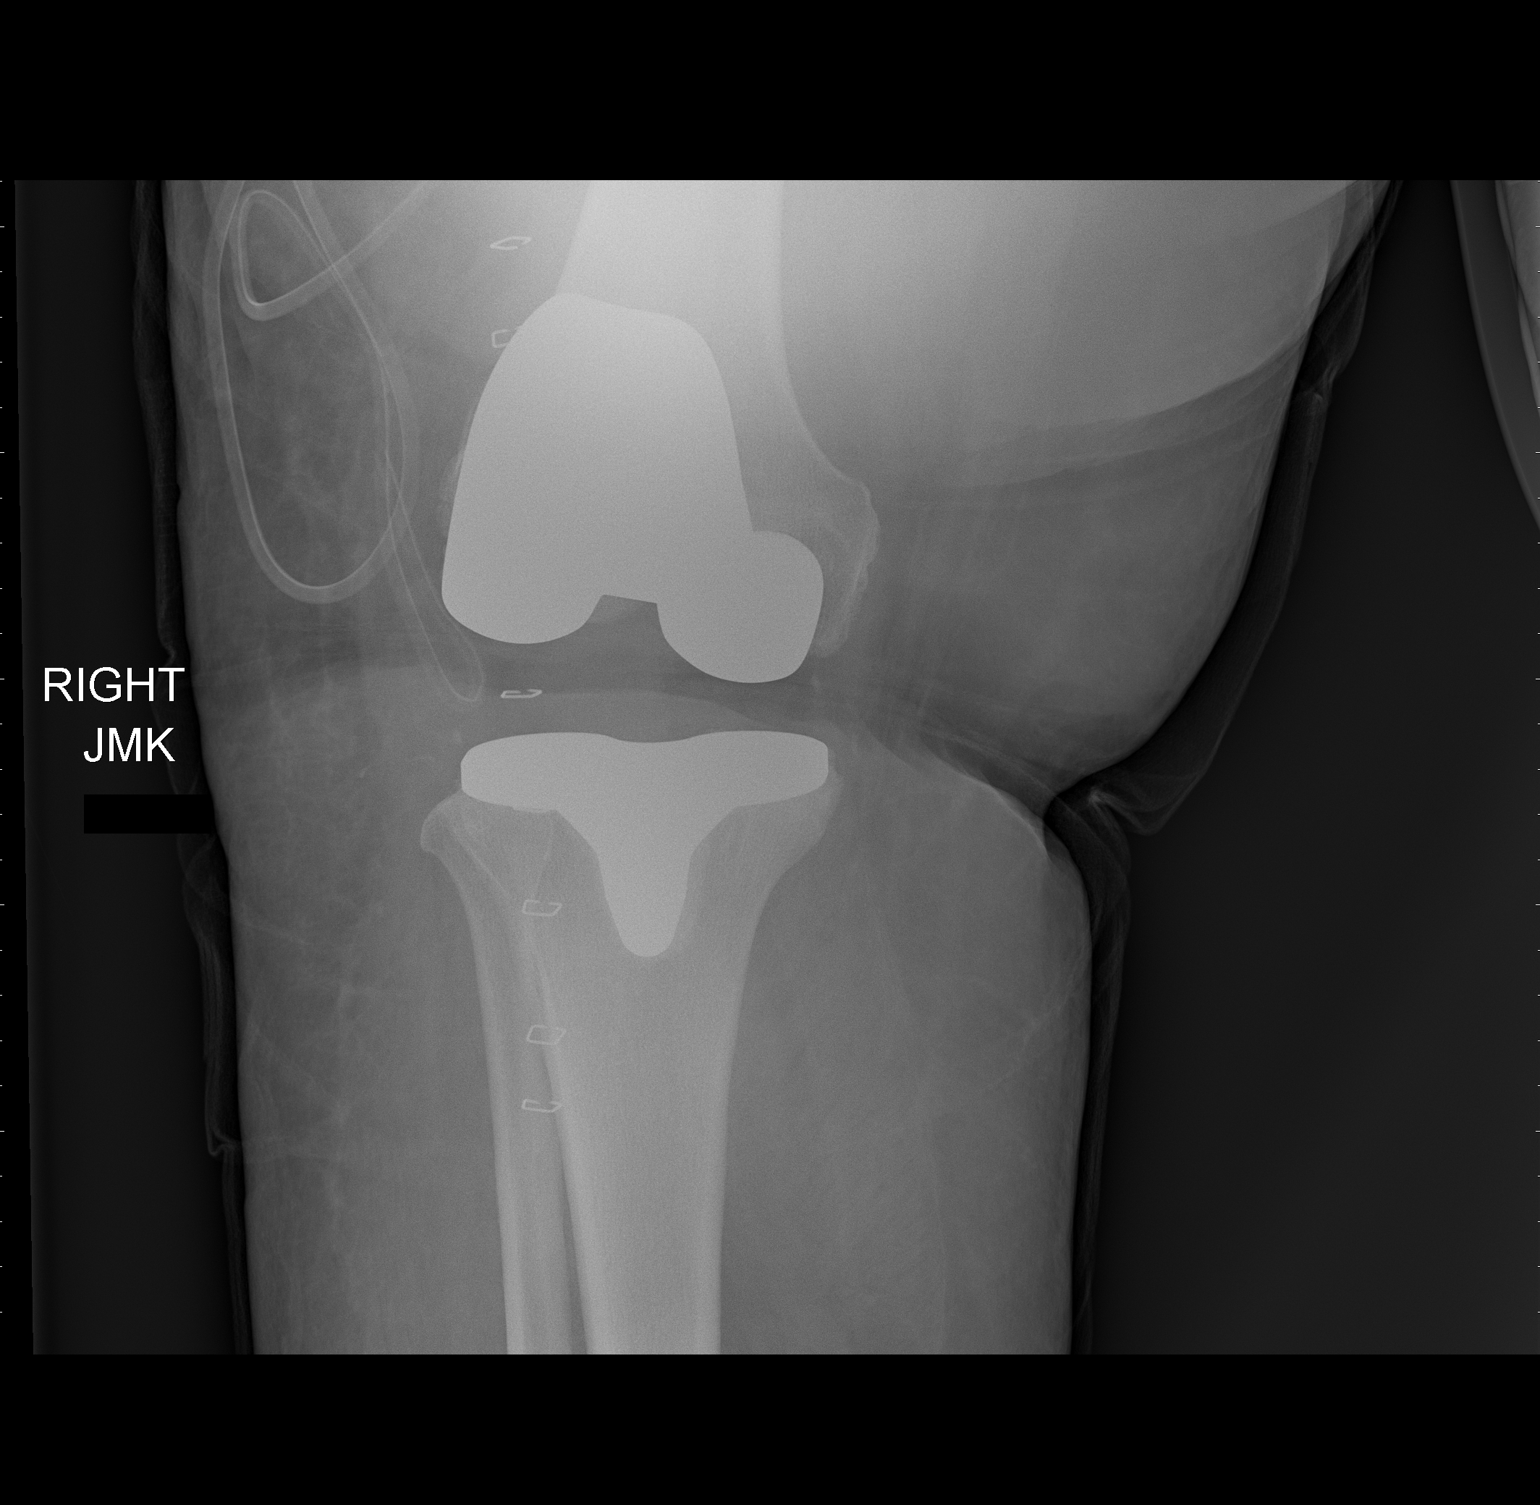

[knee lat]
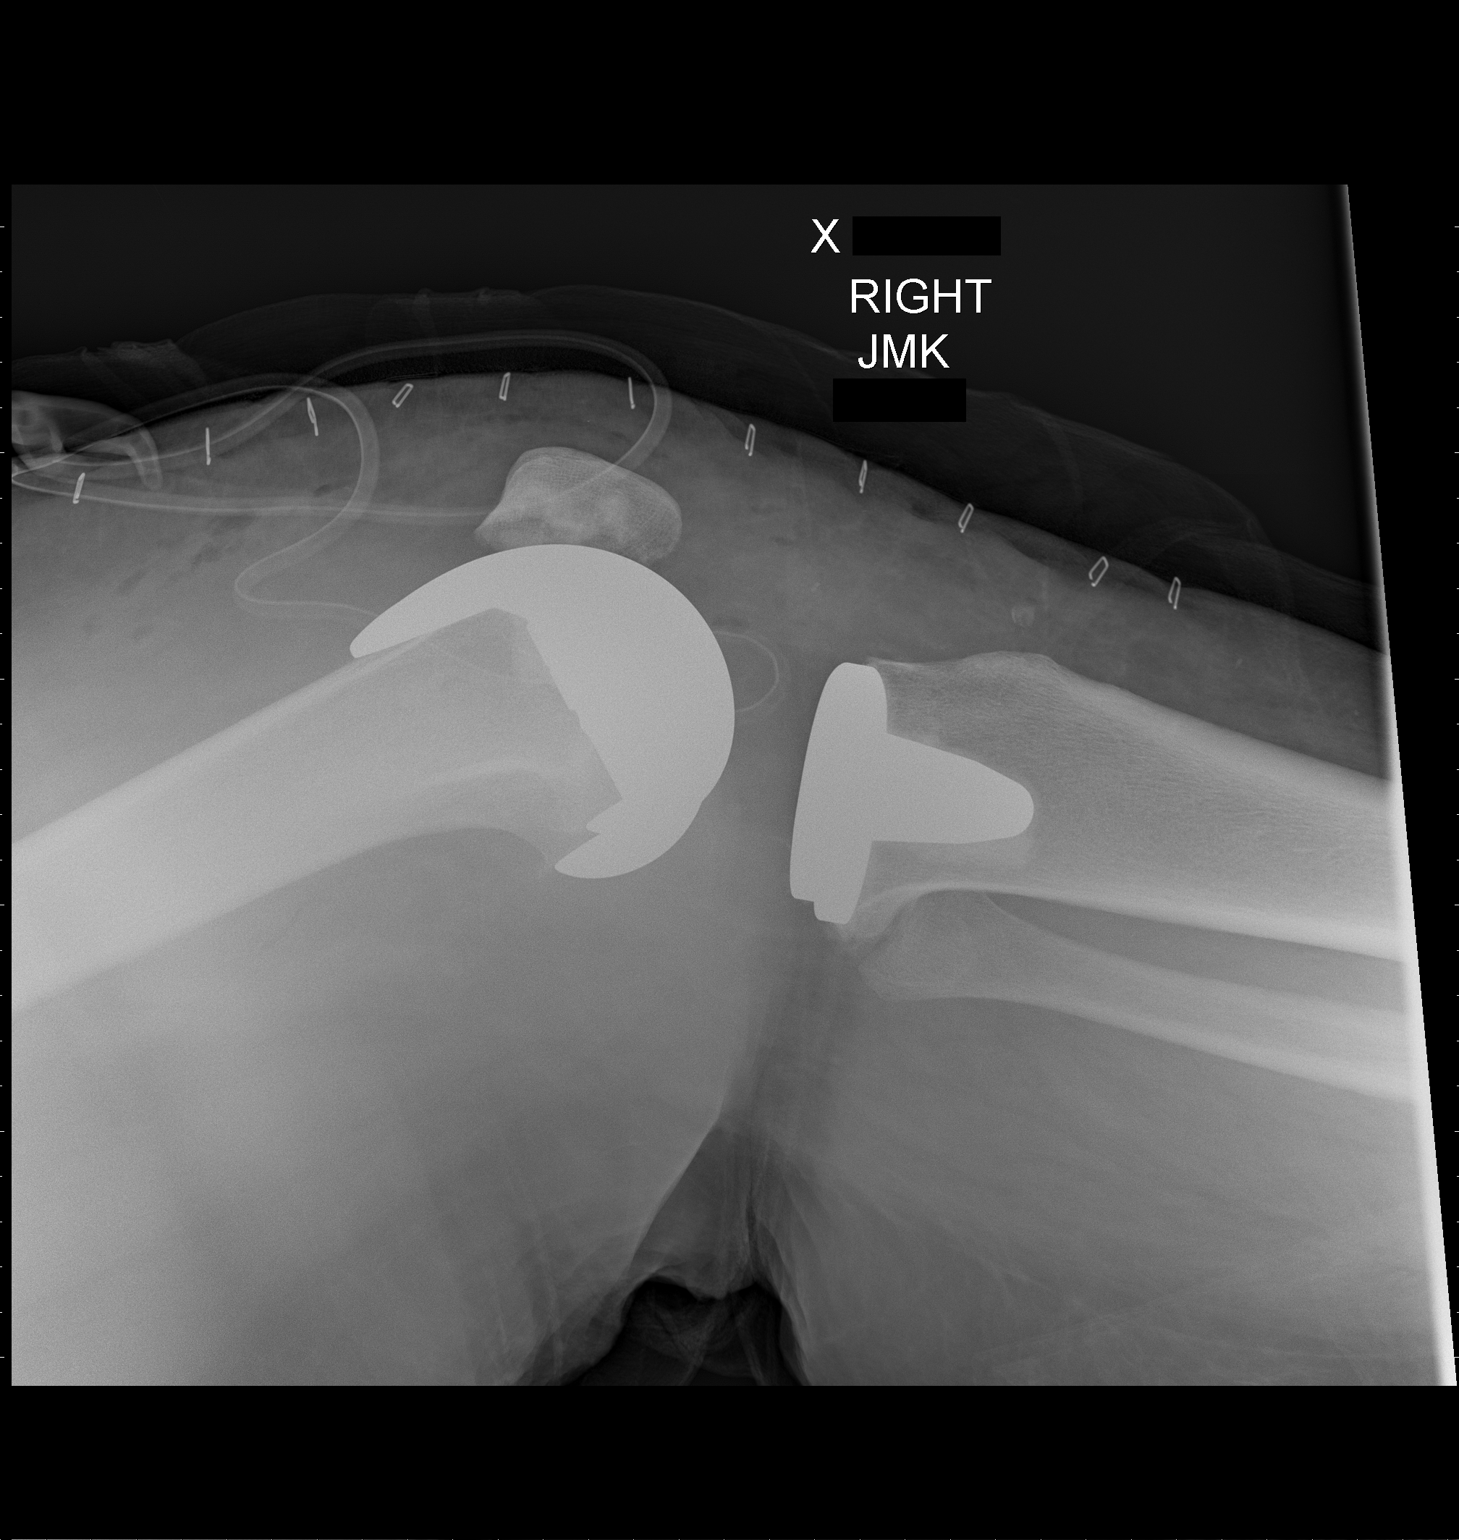

[2 of 2 positions shown; findings below may reference images not displayed]

FINDINGS: AP and lateral views demonstrate that the components
appear in satisfactory position.  Soft tissue drain is in place.
The space between the metallic components is wider than on the
prior exam.  I do not know the significance of this appearance.
IMPRESSION: Total knee prosthesis as described.

## 2009-02-17 DIAGNOSIS — Z9289 Personal history of other medical treatment: Secondary | ICD-10-CM

## 2009-02-17 HISTORY — DX: Personal history of other medical treatment: Z92.89

## 2009-03-14 ENCOUNTER — Telehealth (INDEPENDENT_AMBULATORY_CARE_PROVIDER_SITE_OTHER): Payer: Self-pay | Admitting: Internal Medicine

## 2009-03-15 ENCOUNTER — Emergency Department (HOSPITAL_COMMUNITY): Admission: EM | Admit: 2009-03-15 | Discharge: 2009-03-15 | Payer: Self-pay | Admitting: Emergency Medicine

## 2009-03-15 ENCOUNTER — Telehealth (INDEPENDENT_AMBULATORY_CARE_PROVIDER_SITE_OTHER): Payer: Self-pay | Admitting: Internal Medicine

## 2009-03-16 ENCOUNTER — Telehealth: Payer: Self-pay | Admitting: Internal Medicine

## 2009-03-22 ENCOUNTER — Telehealth: Payer: Self-pay | Admitting: Internal Medicine

## 2009-03-23 ENCOUNTER — Ambulatory Visit: Payer: Self-pay | Admitting: Internal Medicine

## 2009-03-23 LAB — CONVERTED CEMR LAB
ALT: 13 units/L (ref 0–35)
Basophils Relative: 1 % (ref 0–1)
CO2: 26 meq/L (ref 19–32)
Calcium: 9 mg/dL (ref 8.4–10.5)
Chloride: 103 meq/L (ref 96–112)
Cholesterol: 147 mg/dL (ref 0–200)
Eosinophils Absolute: 0.6 10*3/uL (ref 0.0–0.7)
Glucose, Bld: 103 mg/dL — ABNORMAL HIGH (ref 70–99)
Hemoglobin: 11.7 g/dL — ABNORMAL LOW (ref 12.0–15.0)
Lymphs Abs: 2.2 10*3/uL (ref 0.7–4.0)
MCHC: 31.3 g/dL (ref 30.0–36.0)
MCV: 84.6 fL (ref >=78.0)
Monocytes Absolute: 0.4 10*3/uL (ref 0.1–1.0)
Monocytes Relative: 4 % (ref 3–12)
Neutro Abs: 5.4 10*3/uL (ref 1.7–7.7)
Neutrophils Relative %: 63 % (ref 43–77)
RBC: 4.42 M/uL (ref 3.87–5.11)
Sodium: 141 meq/L (ref 135–145)
Total Protein: 6.9 g/dL (ref 6.0–8.3)
Triglycerides: 149 mg/dL (ref <150)
WBC: 8.6 10*3/uL (ref 4.0–10.5)

## 2009-04-18 ENCOUNTER — Emergency Department (HOSPITAL_COMMUNITY): Admission: EM | Admit: 2009-04-18 | Discharge: 2009-04-18 | Payer: Self-pay | Admitting: Family Medicine

## 2009-04-18 ENCOUNTER — Encounter: Payer: Self-pay | Admitting: Internal Medicine

## 2009-04-19 ENCOUNTER — Telehealth (INDEPENDENT_AMBULATORY_CARE_PROVIDER_SITE_OTHER): Payer: Self-pay | Admitting: Internal Medicine

## 2009-05-03 ENCOUNTER — Ambulatory Visit (HOSPITAL_COMMUNITY): Admission: RE | Admit: 2009-05-03 | Discharge: 2009-05-03 | Payer: Self-pay | Admitting: Internal Medicine

## 2009-05-03 ENCOUNTER — Ambulatory Visit: Payer: Self-pay | Admitting: Internal Medicine

## 2009-05-03 DIAGNOSIS — R059 Cough, unspecified: Secondary | ICD-10-CM | POA: Insufficient documentation

## 2009-05-03 DIAGNOSIS — R05 Cough: Secondary | ICD-10-CM

## 2009-05-11 ENCOUNTER — Encounter (INDEPENDENT_AMBULATORY_CARE_PROVIDER_SITE_OTHER): Payer: Self-pay | Admitting: Internal Medicine

## 2009-05-17 ENCOUNTER — Ambulatory Visit: Payer: Self-pay | Admitting: Internal Medicine

## 2009-05-22 ENCOUNTER — Encounter: Admission: RE | Admit: 2009-05-22 | Discharge: 2009-06-20 | Payer: Self-pay | Admitting: Orthopedic Surgery

## 2009-05-23 ENCOUNTER — Telehealth (INDEPENDENT_AMBULATORY_CARE_PROVIDER_SITE_OTHER): Payer: Self-pay | Admitting: Internal Medicine

## 2009-05-28 ENCOUNTER — Ambulatory Visit (HOSPITAL_COMMUNITY): Admission: RE | Admit: 2009-05-28 | Discharge: 2009-05-28 | Payer: Self-pay | Admitting: Internal Medicine

## 2009-05-28 ENCOUNTER — Encounter (INDEPENDENT_AMBULATORY_CARE_PROVIDER_SITE_OTHER): Payer: Self-pay | Admitting: Internal Medicine

## 2009-06-18 ENCOUNTER — Telehealth (INDEPENDENT_AMBULATORY_CARE_PROVIDER_SITE_OTHER): Payer: Self-pay | Admitting: Internal Medicine

## 2009-06-19 ENCOUNTER — Telehealth (INDEPENDENT_AMBULATORY_CARE_PROVIDER_SITE_OTHER): Payer: Self-pay | Admitting: Internal Medicine

## 2009-07-18 HISTORY — PX: REVISION TOTAL KNEE ARTHROPLASTY: SUR1280

## 2009-07-19 ENCOUNTER — Telehealth (INDEPENDENT_AMBULATORY_CARE_PROVIDER_SITE_OTHER): Payer: Self-pay | Admitting: Internal Medicine

## 2009-07-23 ENCOUNTER — Telehealth: Payer: Self-pay | Admitting: *Deleted

## 2009-08-01 ENCOUNTER — Telehealth (INDEPENDENT_AMBULATORY_CARE_PROVIDER_SITE_OTHER): Payer: Self-pay | Admitting: *Deleted

## 2009-08-22 ENCOUNTER — Telehealth: Payer: Self-pay | Admitting: *Deleted

## 2009-08-22 IMAGING — CR DG ABDOMEN 1V
2 series · 2 of 2 positions shown · non-contrast
Comparison: 07/09/2007.

CLINICAL DATA: Constipation.  Abdominal pain.

ABDOMEN - 1 VIEW

[t abdomen supine (1 of 2)]
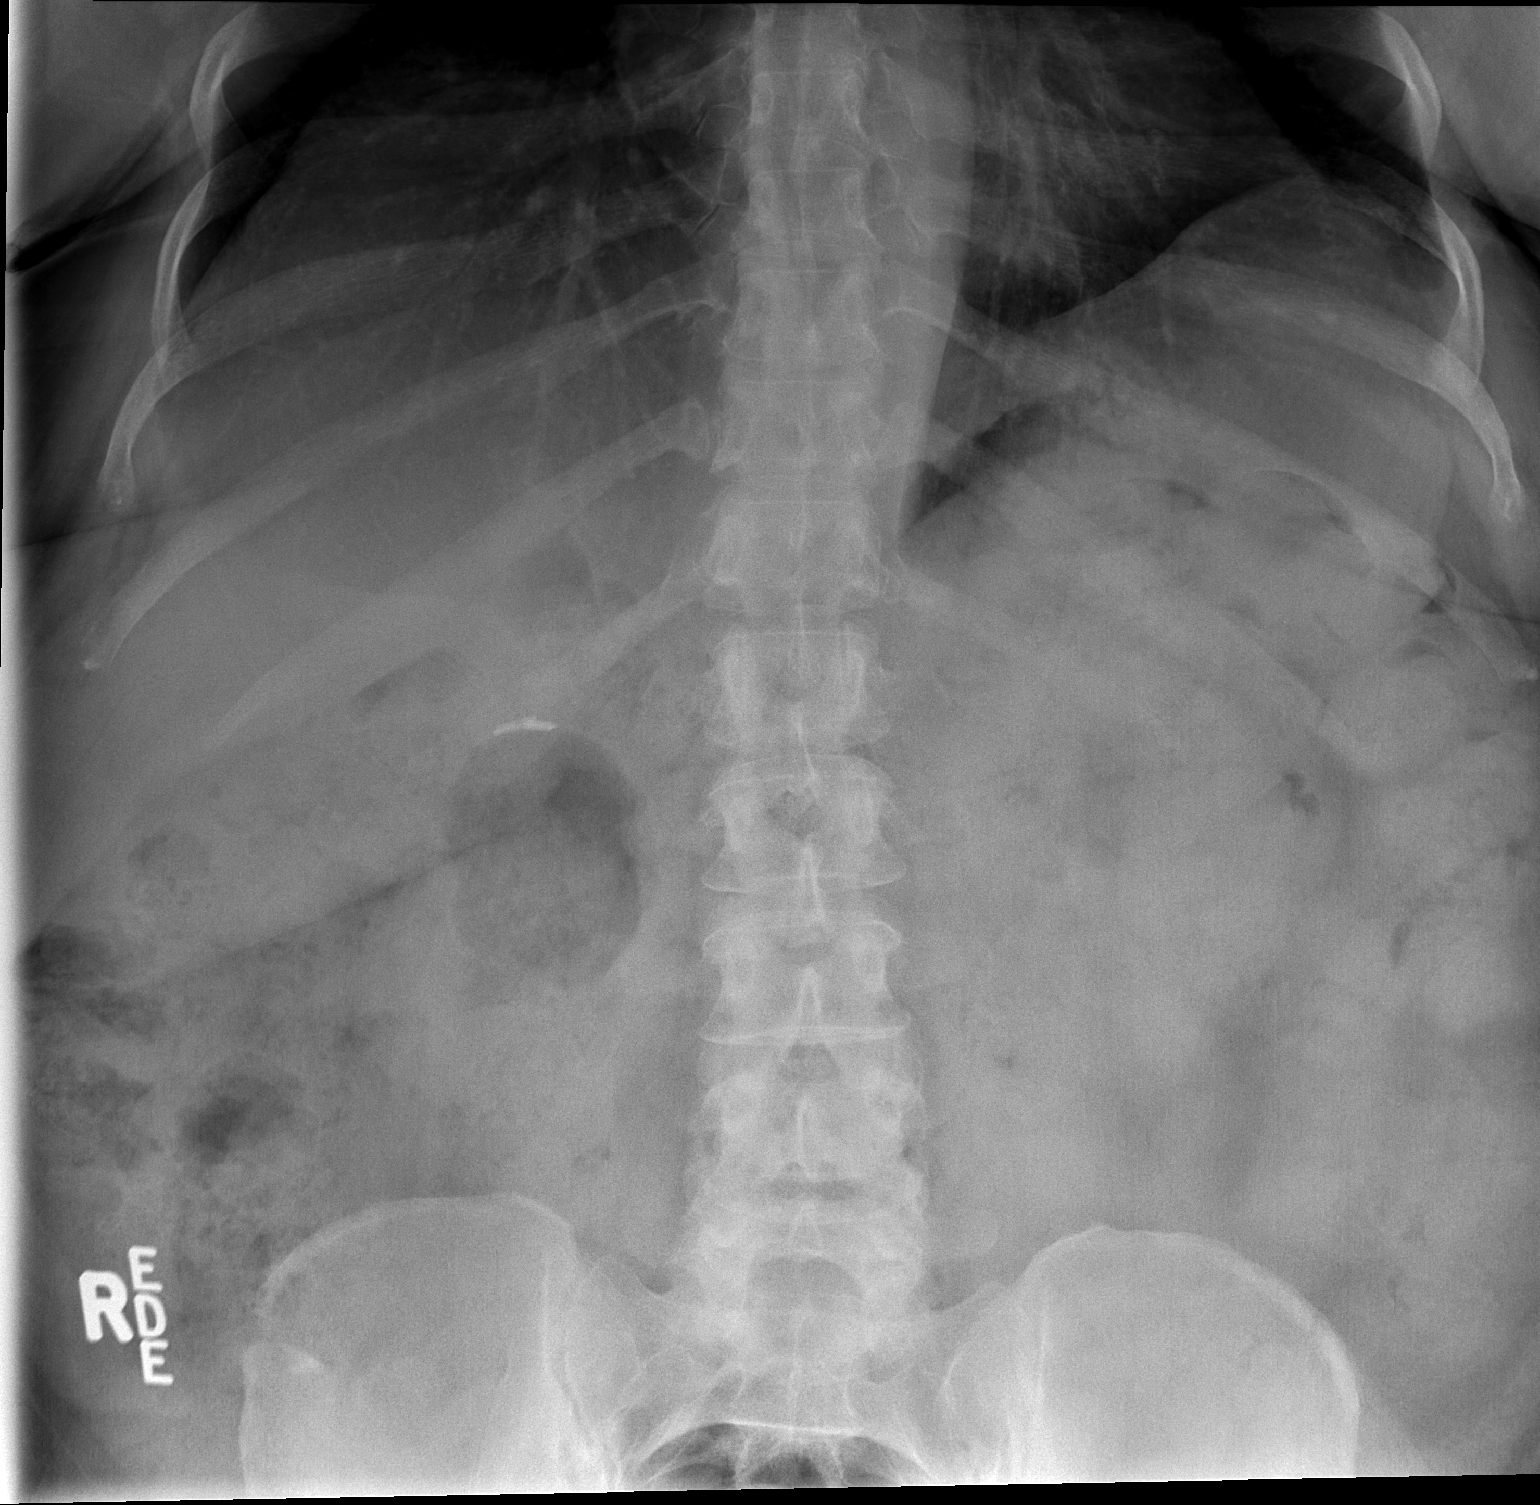

[t abdomen supine (2 of 2)]
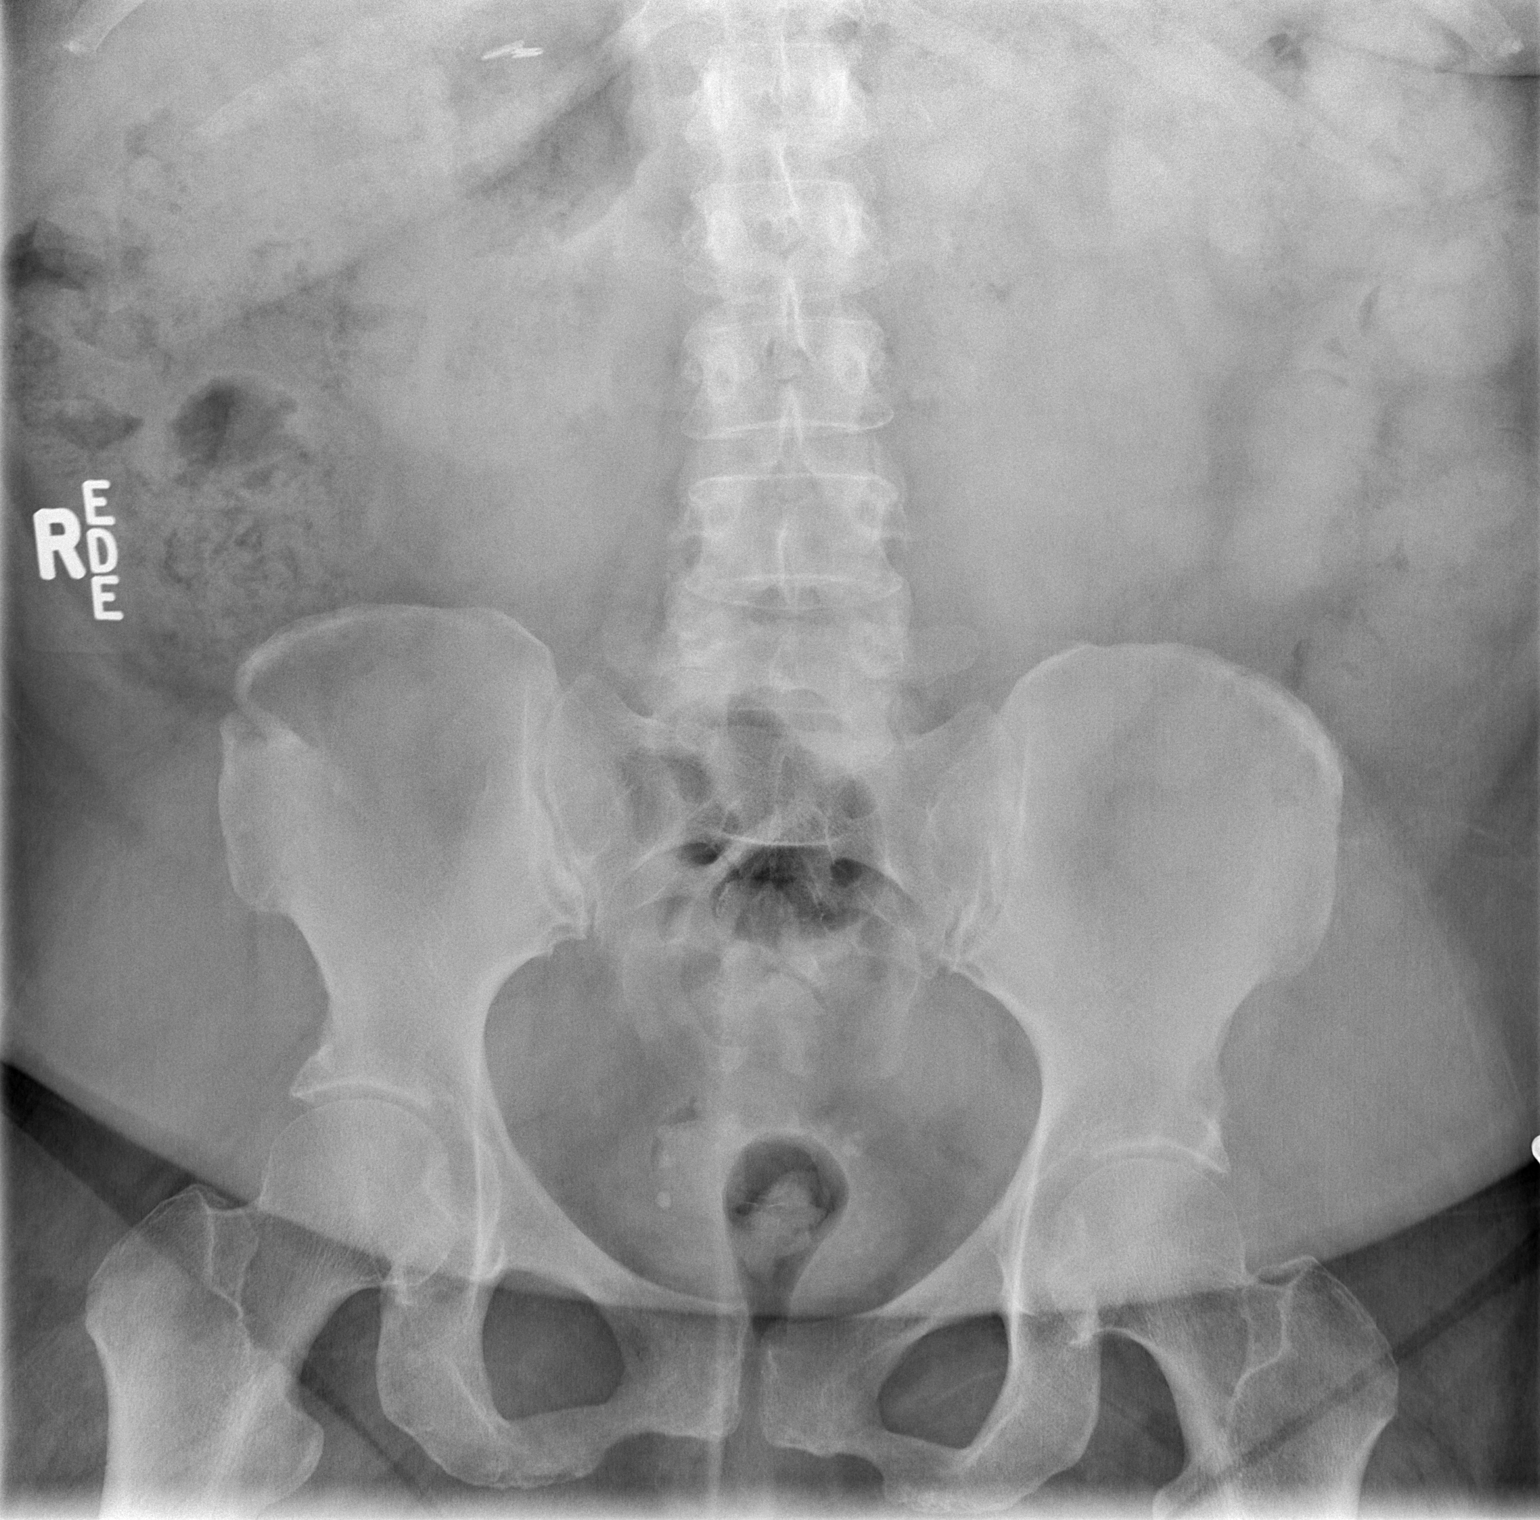

[2 of 2 positions shown; findings below may reference images not displayed]

FINDINGS: Supine abdomen shows no gaseous bowel dilation to suggest
obstruction.  The patient has a prominent amount of stool along the
length of the colon.  Surgical clips in the right upper quadrant
suggest prior cholecystectomy.  No acute bony abnormality is
evident.
IMPRESSION: Nonspecific bowel gas pattern.

Prominent stool throughout the colon raises a question of clinical
constipation.

## 2009-10-05 ENCOUNTER — Telehealth (INDEPENDENT_AMBULATORY_CARE_PROVIDER_SITE_OTHER): Payer: Self-pay | Admitting: *Deleted

## 2009-10-12 ENCOUNTER — Ambulatory Visit: Payer: Self-pay | Admitting: Internal Medicine

## 2009-10-12 ENCOUNTER — Encounter: Payer: Self-pay | Admitting: Internal Medicine

## 2009-10-23 ENCOUNTER — Telehealth: Payer: Self-pay | Admitting: Internal Medicine

## 2009-10-31 ENCOUNTER — Telehealth: Payer: Self-pay | Admitting: Internal Medicine

## 2009-11-06 ENCOUNTER — Telehealth (INDEPENDENT_AMBULATORY_CARE_PROVIDER_SITE_OTHER): Payer: Self-pay | Admitting: *Deleted

## 2009-11-07 ENCOUNTER — Inpatient Hospital Stay (HOSPITAL_COMMUNITY): Admission: RE | Admit: 2009-11-07 | Discharge: 2009-11-12 | Payer: Self-pay | Admitting: Orthopedic Surgery

## 2009-12-18 ENCOUNTER — Ambulatory Visit: Payer: Self-pay

## 2009-12-31 ENCOUNTER — Telehealth: Payer: Self-pay | Admitting: *Deleted

## 2010-01-16 ENCOUNTER — Telehealth: Payer: Self-pay | Admitting: Internal Medicine

## 2010-01-22 ENCOUNTER — Telehealth: Payer: Self-pay | Admitting: *Deleted

## 2010-01-29 ENCOUNTER — Telehealth: Payer: Self-pay | Admitting: Internal Medicine

## 2010-02-07 ENCOUNTER — Telehealth: Payer: Self-pay | Admitting: *Deleted

## 2010-02-19 ENCOUNTER — Ambulatory Visit: Admission: RE | Admit: 2010-02-19 | Discharge: 2010-02-19 | Payer: Self-pay | Source: Home / Self Care

## 2010-02-19 LAB — CONVERTED CEMR LAB
BUN: 10 mg/dL (ref 6–23)
CO2: 25 meq/L (ref 19–32)
Creatinine, Ser: 1 mg/dL (ref 0.40–1.20)
Glucose, Bld: 114 mg/dL — ABNORMAL HIGH (ref 70–99)
HCT: 36.3 % (ref 36.0–46.0)
Hemoglobin: 11.4 g/dL — ABNORMAL LOW (ref 12.0–15.0)
MCHC: 31.4 g/dL (ref 30.0–36.0)
MCV: 84.8 fL (ref 78.0–100.0)
RBC: 4.28 M/uL (ref 3.87–5.11)
Sodium: 142 meq/L (ref 135–145)
TSH: 2.325 microintl units/mL (ref 0.350–4.50)
Total Bilirubin: 0.3 mg/dL (ref 0.3–1.2)
Total Protein: 7.2 g/dL (ref 6.0–8.3)
WBC: 8 10*3/uL (ref 4.0–10.5)

## 2010-03-10 ENCOUNTER — Encounter: Payer: Self-pay | Admitting: Internal Medicine

## 2010-03-10 ENCOUNTER — Encounter: Payer: Self-pay | Admitting: Hospitalist

## 2010-03-11 ENCOUNTER — Encounter: Admission: RE | Admit: 2010-03-11 | Payer: Self-pay | Source: Home / Self Care | Admitting: Orthopedic Surgery

## 2010-03-19 NOTE — Progress Notes (Signed)
Summary: refill/ hla  Phone Note Refill Request Message from:  Fax from Pharmacy on July 19, 2009 4:40 PM  Refills Requested: Medication #1:  VALTREX 1 GM TABS Take 1 tablet by mouth once a day   Last Refilled: 5/1 Initial call taken by: Freddy Finner RN,  July 19, 2009 4:41 PM    Prescriptions: VALTREX 1 GM TABS (VALACYCLOVIR HCL) Take 1 tablet by mouth once a day  #30 x 3   Entered and Authorized by:   Myrtis Ser MD   Signed by:   Myrtis Ser MD on 07/23/2009   Method used:   Electronically to        [facility] [phone]* (retail)       [address]       [place], [place]  [address]       Ph: [phone]       Fax: [phone]   RxID:   [id number]

## 2010-03-19 NOTE — Progress Notes (Signed)
Summary: med refill/gp  Phone Note Refill Request Message from:  Fax from Pharmacy on October 31, 2009 2:59 PM  Refills Requested: Medication #1:  NORVASC 10 MG TABS Take one tablet daily to reduce blood pressure.   Last Refilled: 09/16/2009 Last appt. 10/12/09.  Labs 03/23/09.   Method Requested: Electronic Initial call taken by: Morrison Old RN,  October 31, 2009 2:59 PM  Follow-up for Phone Call        Refilled electronically. Follow-up by: Bertha Stakes MD,  October 31, 2009 3:57 PM    Prescriptions: NORVASC 10 MG TABS (AMLODIPINE BESYLATE) Take one tablet daily to reduce blood pressure.  #32 x 2   Entered and Authorized by:   Bertha Stakes MD   Signed by:   Bertha Stakes MD on 10/31/2009   Method used:   Electronically to        [name] [phone]* (retail)       [address]       [place], [place]  [address]       Ph: [phone]       Fax: [phone]   RxID:   [id number]

## 2010-03-19 NOTE — Progress Notes (Signed)
Summary: Refill/gh  Phone Note Refill Request Message from:  Fax from Pharmacy on October 23, 2009 4:47 PM  Refills Requested: Medication #1:  MOBIC 15 MG TABS Take 1 tablet by mouth once a day   Last Refilled: 09/15/2009 Last office visit was 10/12/2009.  Last labs were 03/23/2009.   Method Requested: Electronic Initial call taken by: Sander Nephew RN,  October 23, 2009 4:47 PM  Follow-up for Phone Call       Follow-up by: Larey Dresser MD,  October 23, 2009 4:54 PM    Prescriptions: MOBIC 15 MG TABS (MELOXICAM) Take 1 tablet by mouth once a day  #30 x 5   Entered and Authorized by:   Larey Dresser MD   Signed by:   Larey Dresser MD on 10/23/2009   Method used:   Electronically to        [facility] [phone]* (retail)       [address]       [place],   [address]       Ph: [phone]       Fax: [phone]   RxID:   [id number]

## 2010-03-19 NOTE — Progress Notes (Signed)
Summary: refill/gg  Phone Note Refill Request  on December 31, 2009 5:07 PM  Refills Requested: Medication #1:  VICODIN ES 7.5-750 MG TABS Take one tab every 6-8 hours as needed for pain   Last Refilled: 11/12/2009  Method Requested: Telephone to Pharmacy Initial call taken by: Gevena Cotton RN,  December 31, 2009 5:08 PM  Follow-up for Phone Call        Refill approved-nurse to complete. Follow-up by: Bertha Stakes MD,  December 31, 2009 5:14 PM  Additional Follow-up for Phone Call Additional follow up Details #1::        Rx called to pharmacy Additional Follow-up by: Gevena Cotton RN,  January 01, 2010 11:43 AM    Prescriptions: VICODIN ES 7.5-750 MG TABS (HYDROCODONE-ACETAMINOPHEN) Take one tab every 6-8 hours as needed for pain  #120 x 0   Entered and Authorized by:   Bertha Stakes MD   Signed by:   Bertha Stakes MD on 12/31/2009   Method used:   Telephoned to ...       [facility] [phone]* (retail)       [address]       [place], [place]  [address]       Ph: [phone]       Fax: [phone]   RxID:   [id number]

## 2010-03-19 NOTE — Progress Notes (Signed)
Summary: med refill/gp  Phone Note Refill Request Message from:  Fax from Pharmacy on March 15, 2009 9:57 AM  Refills Requested: Medication #1:  VALTREX 1 GM TABS Take 1 tablet by mouth once a day   Last Refilled: 02/04/2009  Method Requested: Electronic Initial call taken by: Morrison Old RN,  March 15, 2009 9:57 AM    Prescriptions: [name] 1 GM TABS (VALACYCLOVIR HCL) Take 1 tablet by mouth once a day  #30 x 3   Entered and Authorized by:   Myrtis Ser MD   Signed by:   Myrtis Ser MD on 03/15/2009   Method used:   Electronically to        [facility] [phone]* (retail)       [address]       [place], [place]  [address]       Ph: [phone]       Fax: [phone]   RxID:   [id number]

## 2010-03-19 NOTE — Progress Notes (Signed)
Summary: refill/ hla  Phone Note Refill Request Message from:  Fax from Pharmacy on August 01, 2009 4:48 PM  Refills Requested: Medication #1:  LACTULOSE  SOLN take 54m three times a day   Dosage confirmed as above?Dosage Confirmed   Last Refilled: 12/14 last visit 04/2009 and labs 03/2009  Initial call taken by: HFreddy FinnerRN,  August 01, 2009 4:49 PM  Follow-up for Phone Call        Refill approved-nurse to complete Follow-up by: BRhea Pink DO,  August 01, 2009 4:54 PM  Additional Follow-up for Phone Call Additional follow up Details #1::        Rx faxed to pharmacy Additional Follow-up by: GSander NephewRN,  August 06, 2009 12:14 PM    Prescriptions: LACTULOSE  SOLN (LACTULOSE) take 45mthree times a day, if you developed diarrhea, cut to twice daily.  #1 bottle x 3   Entered and Authorized by:   BeRhea PinkDO   Signed by:   BeRhea PinkDO on 08/01/2009   Method used:   [facility]       [address]     [place]  [address]     Ph: [phone]     Fax: [phone] RxID:   [id number]

## 2010-03-19 NOTE — Progress Notes (Signed)
Summary: med refill/gp  Phone Note Refill Request Message from:  Fax from Pharmacy on Jun 18, 2009 12:28 PM  Refills Requested: Medication #1:  LISINOPRIL 20 MG TABS Take 1 tablet by mouth once a day   Last Refilled: 05/20/2009  Method Requested: Electronic Initial call taken by: Morrison Old RN,  Jun 18, 2009 12:29 PM    Prescriptions: LISINOPRIL 20 MG TABS (LISINOPRIL) Take 1 tablet by mouth once a day  #32 x 11   Entered and Authorized by:   Myrtis Ser MD   Signed by:   Myrtis Ser MD on 06/18/2009   Method used:   Electronically to        [facility] [phone]* (retail)       [address]       [place], [place]  [address]       Ph: [phone]       Fax: [phone]   RxID:   [id number]

## 2010-03-19 NOTE — Assessment & Plan Note (Signed)
Summary: ACUTE-HOARSE/BRONCHITIS/COUGHING/([facility])/CFB   Vital Signs:  Patient profile:   55 year old female Height:      67 inches (170.18 cm) Weight:      336.2 pounds (152.82 kg) BMI:     52.85 O2 Sat:      95 % on Room air Temp:     98.2 degrees F (36.78 degrees C) oral Pulse rate:   109 / minute BP sitting:   134 / 81  (right arm)  Vitals Entered By: Hilda Blades Ditzler RN (March 23, 2009 10:15 AM)  O2 Flow:  Room air Is Patient Diabetic? No Pain Assessment Patient in pain? yes     Location: all over Intensity: 6 Type: aching Onset of pain  since 12/2008 Nutritional Status BMI of > 30 = obese Nutritional Status Detail appetite down  Have you ever been in a relationship where you felt threatened, hurt or afraid?denies   Does patient need assistance? Functional Status Self care Ambulation Impaired:Risk for fall Comments Uses a cane.  Wants to go back on Ventolin - since 11/10 has clear to yellow productive cough and chest hurts from coughing. Went to Urgent Care last week.   Primary Care Provider:  Myrtis Ser MD   History of Present Illness: This is a 55 year old woman with past medical history of asthma, chronic knee pain, HTN, GERD, depression and obesity who is here with cold symptoms for 5 weeks.  Cough, sore throat, nasal and head congestion.   Patient was seen at the urgent care and Dx with bronchitis and referred with PCP for appropriate treatment.  Appetite has decreased some, but she endorses to be keeping herself hydrated.   No diarrhea. No myalgias, but has coughed so much that her chest is sore.  No sick contacts.  No chest pain.  Is coughing up thick clear phlegm, no blood.  Headaches, no ear pain.  Depression History:      The patient denies a depressed mood most of the day and a diminished interest in her usual daily activities.        The patient denies that she feels like life is not worth living, denies that she wishes that she were dead, and  denies that she has thought about ending her life.         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00) 18)  Lump or Mass in Breast  (ICD-611.72) 19)  Pancreatitis, Hx of  (ICD-V12.70) 20)  Hypothyroidism  (ICD-244.9) 21)  Hypertension  (ICD-401.9) 22)  Gerd  (ICD-530.81) 23)  Depression  (ICD-311)  Current Problems (verified): 1)  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00) 18)  Lump or Mass in Breast  (ICD-611.72) 19)  Pancreatitis, Hx of   (ICD-V12.70) 20)  Hypothyroidism  (ICD-244.9) 21)  Hypertension  (ICD-401.9) 22)  Gerd  (ICD-530.81) 23)  Depression  (ICD-311)  Medications Prior to Update: 1)  Synthroid 175 Mcg Tabs (Levothyroxine Sodium) .... Take 1 Tablet By Mouth Once A Day 2)  Paroxetine Hcl 20 Mg  Tabs (Paroxetine Hcl) .... Take One Tablet By Mouth Daily 3)  Wellbutrin Sr 150 Mg Tb12 (Bupropion Hcl) .... Take 3 Tablets By Mouth Once A Day 4)  Seroquel 300 Mg Tabs (Quetiapine Fumarate) .... Take 3 Tablets By Mouth At Night 5)  Valtrex 1 Gm Tabs (Valacyclovir Hcl) .... Take 1 Tablet By Mouth Once A Day 6)  Norvasc 10 Mg Tabs (Amlodipine Besylate) .... Take One Tablet Daily To Reduce Blood Pressure. 7)  Nexium 40 Mg  Cpdr (Esomeprazole Magnesium) .... Take 1 Tablet By Mouth Once A Day 8)  Mobic 15 Mg Tabs (Meloxicam) .... Take 1 Tablet By Mouth Once A Day 9)  Lorazepam 1 Mg Tabs (Lorazepam) .... Take 1 Tablet By Mouth Three Times A Day As Needed 10)  Vicodin Es 7.5-750 Mg Tabs (Hydrocodone-Acetaminophen) .... Take One Tab Every 6-8 Hours As Needed For Pain 11)  Lasix 40 Mg Tabs (Furosemide) .... Take 1 Tablet By Mouth Once A Day 12)  Pravastatin Sodium 80 Mg Tabs (Pravastatin Sodium) .... Take One Tablet At Bedtime. 13)  Lisinopril 20 Mg Tabs (Lisinopril) .... Take 1 Tablet By Mouth Once A Day 14)  Lactulose  Soln (Lactulose) .... Take 3m Three Times A Day, If You Developed Diarrhea, Cut To Twice Daily. 15)  Claritin 10 Mg Tabs (Loratadine) .... Take One Tablet Daily For Allergies. 16)  Advair Diskus 100-50 Mcg/dose Misc (Fluticasone-Salmeterol) .... Use Once Everyday. 17)  Flexeril 10 Mg Tabs (Cyclobenzaprine Hcl) 18)  Ambien 10 Mg Tabs (Zolpidem Tartrate) 19)  Trazodone Hcl 100 Mg Tabs (Trazodone Hcl) 20)  Abilify 2 Mg Tabs (Aripiprazole) 21)  Potassium Gluconate 595 Mg Tabs (Potassium Gluconate) 22)  Benzonatate 200 Mg Caps (Benzonatate) .... Take One Tablet Three Times A Day For Cough. 23)  Sudafed 30 Mg Tabs  (Pseudoephedrine Hcl) .... Take One Tablet Every 8 Hours As Needed For Congestion. 24)  Ventolin Hfa 108 (90 Base) Mcg/act Aers (Albuterol Sulfate) .... 2 Puffs Q 6 Hours As Needed For Shortness of Breath  Current Medications (verified): 1)  Synthroid 175 Mcg Tabs (Levothyroxine Sodium) .... Take 1 Tablet By Mouth Once A Day 2)  Paroxetine Hcl 20 Mg  Tabs (Paroxetine Hcl) .... Take One Tablet By Mouth Daily 3)  Wellbutrin Sr 150 Mg Tb12 (Bupropion Hcl) .... Take 3 Tablets By Mouth Once A Day 4)  Seroquel 300 Mg Tabs (Quetiapine Fumarate) .... Take 3 Tablets By  Mouth At Night 5)  Valtrex 1 Gm Tabs (Valacyclovir Hcl) .... Take 1 Tablet By Mouth Once A Day 6)  Norvasc 10 Mg Tabs (Amlodipine Besylate) .... Take One Tablet Daily To Reduce Blood Pressure. 7)  Nexium 40 Mg  Cpdr (Esomeprazole Magnesium) .... Take 1 Tablet By Mouth Once A Day 8)  Mobic 15 Mg Tabs (Meloxicam) .... Take 1 Tablet By Mouth Once A Day 9)  Lorazepam 1 Mg Tabs (Lorazepam) .... Take 1 Tablet By Mouth Three Times A Day As Needed 10)  Vicodin Es 7.5-750 Mg Tabs (Hydrocodone-Acetaminophen) .... Take One Tab Every 6-8 Hours As Needed For Pain 11)  Lasix 40 Mg Tabs (Furosemide) .... Take 1 Tablet By Mouth Once A Day 12)  Pravastatin Sodium 80 Mg Tabs (Pravastatin Sodium) .... Take One Tablet At Bedtime. 13)  Lisinopril 20 Mg Tabs (Lisinopril) .... Take 1 Tablet By Mouth Once A Day 14)  Lactulose  Soln (Lactulose) .... Take 7m Three Times A Day, If You Developed Diarrhea, Cut To Twice Daily. 15)  Claritin 10 Mg Tabs (Loratadine) .... Take One Tablet Daily For Allergies. 16)  Advair Diskus 100-50 Mcg/dose Misc (Fluticasone-Salmeterol) .... Use Once Everyday. 17)  Flexeril 10 Mg Tabs (Cyclobenzaprine Hcl) 18)  Ambien 10 Mg Tabs (Zolpidem Tartrate) 19)  Trazodone Hcl 100 Mg Tabs (Trazodone Hcl) 20)  Abilify 2 Mg Tabs (Aripiprazole) 21)  Potassium Gluconate 595 Mg Tabs (Potassium Gluconate) 22)  Ventolin Hfa 108 (90 Base) Mcg/act  Aers (Albuterol Sulfate) .... 2 Puffs Q 6 Hours As Needed For Shortness of Breath  Allergies: 1)  ! Morphine 2)  ! * Oxycontin  Past History:  Past Medical History: Last updated: 04/04/2008 Depression GERD Hypertension Hypothyroidism Pancreatitis, hx of Knee Pain/foot pain Obesity Breast Lump s/p right knee replacement 5/09, with revision 06/09 s/p right foot surgery 2007  Past Surgical History: Last updated: 08/04/2007 Lumpectomy Right ankle reconstructive procedure Total knee replacement-right  Family History: Last updated: 02/07/2008 Family History of CAD Female 1st degree relative <50 Brother with MI at 325(per patient no drug use)  Social History: Last updated: 02/07/2008 Occupation: disabled wBiochemist, clinicalSingle Quit smoking 1994 (10pyhx) Alcohol use-no Drug use-no Regular exercise-no  Risk Factors: Alcohol Use: 0 (03/23/2009) Exercise: yes (03/23/2009)  Risk Factors: Smoking Status: quit (03/23/2009) Passive Smoke Exposure: no (03/23/2009)  Review of Systems       As per HPI.  Physical Exam  General:  alert, well-developed, well-hydrated, and overweight-appearing.   Nose:  paranasal sinuses are tneder to palpation and patient is having clear nasal discharge. Lungs:  normal respiratory effort and good air movement; Moderate ronchi auscultated. No wheezes and no crackles. Heart:  normal rate, regular rhythm, and no murmur.   Abdomen:  soft, non-tender, no distention, no guarding, no rebound tenderness, no hepatomegaly, and no splenomegaly.   Neurologic:  alert & oriented X3, cranial nerves II-XII intact, and gait normal.     Impression & Recommendations:  Problem # 1:  URI (ICD-465.9) Patient with chronic lungs problems (asthma) which make her at higher risk for lungs infection. Since she has been having this productive cough and also subjective fever for over 4 weeks now; will treat as bacterial bronchitis. Will start treatment with doxycycline  two times a day for 10 days; will use loratadine and also mucinex for symptoms control. Patient advised to keep herself hydrated and to use tylenol for fever and comfort.   The following medications were removed from the medication list:    Benzonatate  200 Mg Caps (Benzonatate) .[name]... Take one tablet three times a day for cough. Her updated medication list for this problem includes:    Mobic 15 Mg Tabs (Meloxicam) .[name]... Take 1 tablet by mouth once a day    Claritin 10 Mg Tabs (Loratadine) .[name]... Take one tablet daily for allergies.    Loratadine 10 Mg Tabs (Loratadine) .[name]... Take 1 tablet by mouth once a day  Problem # 2:  HYPOTHYROIDISM (ICD-244.9) TSH was checked during this visit and was WNL. Will continue current synthroid dose. Patient denies any hypothyroid symptoms.  Her updated medication list for this problem includes:    Synthroid 175 Mcg Tabs (Levothyroxine sodium) .[name]... Take 1 tablet by mouth once a day  Problem # 3:  HYPERLIPIDEMIA (ICD-272.4) Patient lipid profile at goal and LFT's WNL. Will continue current regimen and will advised patient to follow a low fat diet.  Her updated medication list for this problem includes:    Pravastatin Sodium 80 Mg Tabs (Pravastatin sodium) .[name]... Take one tablet at bedtime.  Problem # 4:  ASTHMA (ICD-493.90) Stable. Will continue current regimen of as needed albuterol and maintenance advair inhaler two times a day.Patient reports to be compliant with her medications. Will refill her prescriptions today.  Her updated medication list for this problem includes:    Advair Diskus 100-50 Mcg/dose Misc (Fluticasone-salmeterol) ..... Inhale 1 puff two times a day    Ventolin Hfa 108 (90 Base) Mcg/act Aers (Albuterol sulfate) .[name]... 2 puffs q 6 hours as needed for shortness of breath  Problem # 5:  HYPERTENSION (ICD-401.9) BP is well controlled and at goal. Will continue current regimen and will advised her to follow a low sodium diet. Will check renal  function and electrolytes today.  Her updated medication list for this problem includes:    Norvasc 10 Mg Tabs (Amlodipine besylate) .[name]... Take one tablet daily to reduce blood pressure.    Lasix 40 Mg Tabs (Furosemide) .[name]... Take 1 tablet by mouth once a day    Lisinopril 20 Mg Tabs (Lisinopril) .[name]... Take 1 tablet by mouth once a day  Orders: T-Lipid Profile [id number]) T-CBC w/Diff [id number]) T-Comprehensive Metabolic Panel ([id number]) T-TSH ([id number])  BP today: 134/81 Prior BP: 134/93 (02/05/2009)  Labs Reviewed: K+: 4.3 (11/07/2008) Creat: : 1.16 (11/07/2008)   Chol: 161 (11/09/2008)   HDL: 63 (11/09/2008)   LDL: 79 (11/09/2008)   TG: 96 (11/09/2008)  Problem # 6:  GERD (ICD-530.81) Patient report good compliance with her nexium and also lifestyle modifications. She denies any reflux or severe indigestion symptoms. will continue current regimen.  Her updated medication list for this problem includes:    Nexium 40 Mg Cpdr (Esomeprazole magnesium) .[name]... Take 1 tablet by mouth once a day  Problem # 7:  KNEE PAIN (ICD-719.46) Chronic and stable. Will continue same regimen. Patient encourage to lose weight and tokeep herself active. Will refill her vicodin.  Her updated medication list for this problem includes:    Mobic 15 Mg Tabs (Meloxicam) .[name]... Take 1 tablet by mouth once a day    Vicodin Es 7.5-750 Mg Tabs (Hydrocodone-acetaminophen) .[name]... Take one tab every 6-8 hours as needed for pain    Flexeril 10 Mg Tabs (Cyclobenzaprine hcl)  Complete Medication List: 1)  Synthroid 175 Mcg Tabs (Levothyroxine sodium) .... Take 1 tablet by mouth once a day 2)  Paroxetine Hcl 20 Mg Tabs (Paroxetine hcl) .... Take one tablet by mouth daily 3)  Wellbutrin Sr 150 Mg Tb12 (Bupropion hcl) .Marland KitchenMarland KitchenMarland Kitchen  Take 3 tablets by mouth once a day 4)  Seroquel 300 Mg Tabs (Quetiapine fumarate) .... Take 3 tablets by mouth at night 5)  Valtrex 1 Gm Tabs (Valacyclovir hcl) .... Take 1 tablet by mouth  once a day 6)  Norvasc 10 Mg Tabs (Amlodipine besylate) .... Take one tablet daily to reduce blood pressure. 7)  Nexium 40 Mg Cpdr (Esomeprazole magnesium) .... Take 1 tablet by mouth once a day 8)  Mobic 15 Mg Tabs (Meloxicam) .... Take 1 tablet by mouth once a day 9)  Lorazepam 1 Mg Tabs (Lorazepam) .... Take 1 tablet by mouth three times a day as needed 10)  Vicodin Es 7.5-750 Mg Tabs (Hydrocodone-acetaminophen) .... Take one tab every 6-8 hours as needed for pain 11)  Lasix 40 Mg Tabs (Furosemide) .... Take 1 tablet by mouth once a day 12)  Pravastatin Sodium 80 Mg Tabs (Pravastatin sodium) .... Take one tablet at bedtime. 13)  Lisinopril 20 Mg Tabs (Lisinopril) .... Take 1 tablet by mouth once a day 14)  Lactulose Soln (Lactulose) .... Take 32m three times a day, if you developed diarrhea, cut to twice daily. 15)  Claritin 10 Mg Tabs (Loratadine) .... Take one tablet daily for allergies. 16)  Advair Diskus 100-50 Mcg/dose Misc (Fluticasone-salmeterol) .... Inhale 1 puff two times a day 17)  Flexeril 10 Mg Tabs (Cyclobenzaprine hcl) 18)  Ambien 10 Mg Tabs (Zolpidem tartrate) 19)  Trazodone Hcl 100 Mg Tabs (Trazodone hcl) 20)  Abilify 2 Mg Tabs (Aripiprazole) 21)  Potassium Gluconate 595 Mg Tabs (Potassium gluconate) 22)  Ventolin Hfa 108 (90 Base) Mcg/act Aers (Albuterol sulfate) .... 2 puffs q 6 hours as needed for shortness of breath 23)  Loratadine 10 Mg Tabs (Loratadine) .... Take 1 tablet by mouth once a day 24)  Doxycycline Hyclate 100 Mg Tabs (Doxycycline hyclate) .... Take 1 tablet by mouth two times a day  Patient Instructions: 1)  Get plenty of rest, drink lots of clear liquids, and use tylenol for fever and comfort. Return in 7-10 days if you're not better:sooner if you're feeling worse. 2)  Take your medications as prescribed. 3)  Follow a low sodium and low fat diet. 4)  Please schedule a follow-up appointment in 3 months. 5)  Use mucinex OTC for cough control (6051mtwo  times a day). 6)  Avoid foods high in acid (tomatoes, citrus juices, spicy foods). Avoid eating within two hours of lying down or before exercising. Do not over eat; try smaller more frequent meals. Elevate head of bed twelve inches when sleeping. Prescriptions: ADVAIR DISKUS 100-50 MCG/DOSE MISC (FLUTICASONE-SALMETEROL) Inhale 1 puff two times a day  #1 x 8   Entered and Authorized by:   CaBarton DuboisD   Signed by:   CaBarton DuboisD on 03/23/2009   Method used:   [facility]        [facility]       [address]     [place]  [id number]     Ph: [phone]     Fax: [phone] RxID:   [id number] ES 7.5-750 MG TABS (HYDROCODONE-ACETAMINOPHEN) Take one tab every 6-8 hours as needed for pain  #120 x 0   Entered and Authorized by:   CaBarton DuboisD   Signed by:   CaBarton DuboisD on 03/23/2009   Method used:   Print then  Give to Patient   RxID:   [id number] DOXYCYCLINE HYCLATE 100 MG TABS (DOXYCYCLINE HYCLATE) Take 1 tablet by mouth two times a day  #20 x 0   Entered and Authorized by:   Barton Dubois MD   Signed by:   Barton Dubois MD on 03/23/2009   Method used:   Electronically to        [facility] (retail)       [address]       [place], [place]  [address]       Ph: [phone]       Fax: [phone]   RxID:   [id number] LORATADINE 10 MG TABS (LORATADINE) Take 1 tablet by mouth once a day  #31 x 1   Entered and Authorized by:   Barton Dubois MD   Signed by:   Barton Dubois MD on 03/23/2009   Method used:   Electronically to        [facility] (retail)       [address]       [place], [place]  [address]       Ph: [phone]       Fax: [phone]   RxID:   [id number]   Prevention & Chronic Care Immunizations   Influenza vaccine: Not documented    Tetanus booster: Not documented    Pneumococcal vaccine: Not  documented  Colorectal Screening   Hemoccult: Not documented    Colonoscopy: Not documented   Colonoscopy action/deferral: GI referral  (08/28/2008)  Other Screening   Pap smear: Not documented   Pap smear action/deferral: Not indicated S/P hysterectomy  (08/28/2008)    Mammogram: No specific mammographic evidence of malignancy.  Assessment: BIRADS 1.   (09/07/2008)   Mammogram action/deferral: Screening mammogram in 1 year.     (09/07/2008)   Smoking status: quit  (03/23/2009)  Lipids   Total Cholesterol: 161  (11/09/2008)   Lipid panel action/deferral: Lipid Panel ordered   LDL: 79  (11/09/2008)   LDL Direct: Not documented   HDL: 63  (11/09/2008)   Triglycerides: 96  (11/09/2008)    SGOT (AST): 11  (11/07/2008)   SGPT (ALT): 17  (04/04/2008) CMP ordered    Alkaline phosphatase: 77  (04/04/2008)   Total bilirubin: 0.2  (04/04/2008)  Hypertension   Last Blood Pressure: 134 / 81  (03/23/2009)   Serum creatinine: 1.16  (11/07/2008)   Serum potassium 4.3  (11/07/2008) CMP ordered   Self-Management Support :    Patient will work on the following items until the next clinic visit to reach self-care goals:     Medications and monitoring: take my medicines every day, check my blood pressure, weigh myself weekly  (03/23/2009)     Eating: drink diet soda or water instead of juice or soda, eat more vegetables, use fresh or frozen vegetables, eat foods that are low in salt, eat baked foods instead of fried foods, eat fruit for snacks and desserts, limit or avoid alcohol  (03/23/2009)     Activity: take a 30 minute walk every day  (11/06/2008)    Hypertension self-management support: Not documented    Lipid self-management support: Not documented   Process Orders Check Orders Results:     [facility]: Check successful Tests Sent for requisitioning (April 04, 2009 10:24 AM):     03/23/2009: [facility] -- T-Lipid Profile [id number]  (  signed)     03/23/2009: [facility] -- T-CBC w/Diff [[id number]] (signed)     03/23/2009: [facility] -- T-Comprehensive Metabolic Panel [[id number]] (signed)     03/23/2009: [facility] -- T-TSH [id number] (signed)

## 2010-03-19 NOTE — Progress Notes (Signed)
Summary: Inhaler  Phone Note Refill Request   Refills Requested: Medication #1:  Ventolin Inhaler Call from pt would like to get the Ventalin Inhaler instead of the Proventil   Method Requested: Electronic Initial call taken by: Sander Nephew RN,  March 16, 2009 2:52 PM    New/Updated Medications: VENTOLIN HFA 108 (90 BASE) MCG/ACT AERS (ALBUTEROL SULFATE) 2 puffs q 6 hours as needed for shortness of breath Prescriptions: VENTOLIN HFA 108 (90 BASE) MCG/ACT AERS (ALBUTEROL SULFATE) 2 puffs q 6 hours as needed for shortness of breath  #1 x 11   Entered and Authorized by:   Larey Dresser MD   Signed by:   Larey Dresser MD on 03/22/2009   Method used:   Electronically to        [facility] (retail)       [address]       [place], [place]  [address]       Ph: [phone]       Fax: [phone]   RxID:   [id number]

## 2010-03-19 NOTE — Letter (Signed)
Summary: COUGH/ASTHMA  COUGH/ASTHMA   Imported By: Garlan Fillers 06/21/2009 14:02:19  _____________________________________________________________________  External Attachment:    Type:   Image     Comment:   External Document  Appended Document: COUGH/ASTHMA   Impression & Recommendations:  Problem # 1:  ASTHMA (ICD-493.90) Lung function test 05/28/2009, minimal obst airway disease with good response to bronchodilators.  Her updated medication list for this problem includes:    Ventolin Hfa 108 (90 Base) Mcg/act Aers (Albuterol sulfate) .[name]... 2 puffs q 6 hours as needed for shortness of breath   Complete Medication List: 1)  Synthroid 175 Mcg Tabs (Levothyroxine sodium) .... Take 1 tablet by mouth once a day 2)  Paroxetine Hcl 20 Mg Tabs (Paroxetine hcl) .... Take one tablet by mouth daily 3)  Wellbutrin Sr 150 Mg Tb12 (Bupropion hcl) .... Take 3 tablets by mouth once a day 4)  Seroquel 300 Mg Tabs (Quetiapine fumarate) .... Take 3 tablets by mouth at night 5)  Valtrex 1 Gm Tabs (Valacyclovir hcl) .... Take 1 tablet by mouth once a day 6)  Norvasc 10 Mg Tabs (Amlodipine besylate) .... Take one tablet daily to reduce blood pressure. 7)  Nexium 40 Mg Cpdr (Esomeprazole magnesium) .... Take 1 tablet by mouth once a day 8)  Mobic 15 Mg Tabs (Meloxicam) .... Take 1 tablet by mouth once a day 9)  Lorazepam 1 Mg Tabs (Lorazepam) .... Take 1 tablet by mouth three times a day as needed 10)  Vicodin Es 7.5-750 Mg Tabs (Hydrocodone-acetaminophen) .... Take one tab every 6-8 hours as needed for pain 11)  Lasix 40 Mg Tabs (Furosemide) .... Take 1 tablet by mouth once a day 12)  Pravastatin Sodium 80 Mg Tabs (Pravastatin sodium) .... Take one tablet at bedtime. 13)  Lisinopril 20 Mg Tabs (Lisinopril) .... Take 1 tablet by mouth once a day 14)  Lactulose Soln (Lactulose) .... Take 68m three times a day, if you developed diarrhea, cut to twice daily. 15)  Flexeril 10 Mg Tabs  (Cyclobenzaprine hcl) 16)  Ambien 10 Mg Tabs (Zolpidem tartrate) 17)  Trazodone Hcl 100 Mg Tabs (Trazodone hcl) 18)  Abilify 2 Mg Tabs (Aripiprazole) 19)  Ventolin Hfa 108 (90 Base) Mcg/act Aers (Albuterol sulfate) .... 2 puffs q 6 hours as needed for shortness of breath 20)  Nasonex 50 Mcg/act Susp (Mometasone furoate) .... 2 sprays in each nostril daily 21)  Tessalon Perles 100 Mg Caps (Benzonatate) .... Take one cap as needed for cough 4-6 times a day 22)  Cheratussin Ac 100-10 Mg/528mSyrp (Guaifenesin-codeine) .... Take 1-2 tsp every 4-6 hours as needed for cough.

## 2010-03-19 NOTE — Assessment & Plan Note (Signed)
Summary: 2WK F/U/WALSH/VS   Vital Signs:  Patient profile:   55 year old female Height:      67 inches Weight:      342.3 pounds BMI:     53.81 O2 Sat:      96 % on Room air Temp:     97.1 degrees F oral Pulse rate:   114 / minute BP sitting:   142 / 90  (right arm)  Vitals Entered By: Silverio Decamp NT II (May 17, 2009 2:30 PM)  O2 Flow:  Room air CC: followup care, Depression Pain Assessment Patient in pain? yes     Location: bodyaches Intensity: 8 Type: aching Nutritional Status BMI of > 30 = obese  Does patient need assistance? Functional Status Self care Ambulation Impaired:Risk for fall   Primary Care Provider:  Myrtis Ser MD  CC:  followup care and Depression.  History of Present Illness: [name] is a 55 year old Female with PMH/problems as outlined in the EMR, who presents to the [facility] with chief complaint(s) of:    -- cough: patient has had "bronchitis" since nov 2010 and seems to be going on and on. coughing up phlegm, mucus from the nose, has shortness of breath and some tightness in the chest. No wheezing. No fever or chills, but sometimes feels hot inside. Takes nexium for acid reflux, and that seems to help with it. She was treated with doxy, amoxy and zithromax over this period of time. Ex-smoker, quit 10 years ago. No weight loss.   Patient was last seen in clinic 03/17 for the above symptoms and patient was not given abx as her complaints are chronic in nature. She was instructed to get PFTs for further evaluation of cough, as patient does have hx of Asthma but no COPD. Patient is scheduled to be seen by pulmonologist next week.  Patient still c/o cough with yellow/green sputum, no fevers, or chills. Shortness of breath, sore throat, runny nose, and myalgias.     Depression History:      The patient denies a depressed mood most of the day and a diminished interest in her usual daily activities.         Preventive Screening-Counseling &  Management  Alcohol-Tobacco     Alcohol drinks/day: 0     Smoking Status: quit     Year Quit: 1994     Passive Smoke Exposure: no  Caffeine-Diet-Exercise     Does Patient Exercise: yes     Type of exercise: WALKING     Times/week:   3  Current Medications (verified): 1)  Synthroid 175 Mcg Tabs (Levothyroxine Sodium) .... Take 1 Tablet By Mouth Once A Day 2)  Paroxetine Hcl 20 Mg  Tabs (Paroxetine Hcl) .... Take One Tablet By Mouth Daily 3)  Wellbutrin Sr 150 Mg Tb12 (Bupropion Hcl) .... Take 3 Tablets By Mouth Once A Day 4)  Seroquel 300 Mg Tabs (Quetiapine Fumarate) .... Take 3 Tablets By Mouth At Night 5)  Valtrex 1 Gm Tabs (Valacyclovir Hcl) .... Take 1 Tablet By Mouth Once A Day 6)  Norvasc 10 Mg Tabs (Amlodipine Besylate) .... Take One Tablet Daily To Reduce Blood Pressure. 7)  Nexium 40 Mg  Cpdr (Esomeprazole Magnesium) .... Take 1 Tablet By Mouth Once A Day 8)  Mobic 15 Mg Tabs (Meloxicam) .... Take 1 Tablet By Mouth Once A Day 9)  Lorazepam 1 Mg Tabs (Lorazepam) .... Take 1 Tablet By Mouth Three Times A Day As Needed 10)  Vicodin Es 7.5-750 Mg Tabs (Hydrocodone-Acetaminophen) .... Take One Tab Every 6-8 Hours As Needed For Pain 11)  Lasix 40 Mg Tabs (Furosemide) .... Take 1 Tablet By Mouth Once A Day 12)  Pravastatin Sodium 80 Mg Tabs (Pravastatin Sodium) .... Take One Tablet At Bedtime. 13)  Lisinopril 20 Mg Tabs (Lisinopril) .... Take 1 Tablet By Mouth Once A Day 14)  Lactulose  Soln (Lactulose) .... Take 101m Three Times A Day, If You Developed Diarrhea, Cut To Twice Daily. 15)  Flexeril 10 Mg Tabs (Cyclobenzaprine Hcl) 16)  Ambien 10 Mg Tabs (Zolpidem Tartrate) 17)  Trazodone Hcl 100 Mg Tabs (Trazodone Hcl) 18)  Abilify 2 Mg Tabs (Aripiprazole) 19)  Ventolin Hfa 108 (90 Base) Mcg/act Aers (Albuterol Sulfate) .... 2 Puffs Q 6 Hours As Needed For Shortness of Breath 20)  Nasonex 50 Mcg/act Susp (Mometasone Furoate) .... 2 Sprays in Each Nostril Daily 21)  Tessalon Perles  100 Mg Caps (Benzonatate) .... Take One Cap As Needed For Cough 4-6 Times A Day  Allergies (verified): 1)  ! Morphine 2)  ! * Oxycontin  Past History:  Past Medical History: Last updated: 04/04/2008 Depression GERD Hypertension Hypothyroidism Pancreatitis, hx of Knee Pain/foot pain Obesity Breast Lump s/p right knee replacement 5/09, with revision 06/09 s/p right foot surgery 2007  Past Surgical History: Last updated: 08/04/2007 Lumpectomy Right ankle reconstructive procedure Total knee replacement-right  Family History: Last updated: 02/07/2008 Family History of CAD Female 1st degree relative <50 Brother with MI at 321(per patient no drug use)  Social History: Last updated: 02/07/2008 Occupation: disabled wBiochemist, clinicalSingle Quit smoking 1994 (10pyhx) Alcohol use-no Drug use-no Regular exercise-no  Risk Factors: Alcohol Use: 0 (05/17/2009) Exercise: yes (05/17/2009)  Risk Factors: Smoking Status: quit (05/17/2009) Passive Smoke Exposure: no (05/17/2009)  Review of Systems General:  Denies chills, fever, and weakness. CV:  Complains of difficulty breathing while lying down, fatigue, and shortness of breath with exertion; denies chest pain or discomfort and difficulty breathing at night. Resp:  Complains of cough, shortness of breath, and sputum productive.  Physical Exam  General:  alert and well-developed.   Head:  normocephalic and atraumatic.   Eyes:  vision grossly intact, pupils equal, pupils round, and pupils reactive to light.   Neck:  supple, full ROM, and no masses.   Lungs:  normal respiratory effort, no intercostal retractions, no accessory muscle use, normal breath sounds, no dullness, no fremitus, no crackles, and no wheezes.   Heart:  normal rate, regular rhythm, no murmur, no gallop, and no rub.   Abdomen:  soft, non-tender, and normal bowel sounds.   Msk:  normal ROM.   Neurologic:  alert & oriented X3 and cranial nerves II-XII intact.      Impression & Recommendations:  Problem # 1:  COUGH (ICD-786.2) Assessment Deteriorated Unchanged from prior visit. Given duration of symptoms, will try a course of abx (azithro), as well as control cough with cherritussin. Pt scheduled to be seen for PFTs next week. Will follow up their recommendations. Her cough is likely secondary to post-viral vs post-nasal drip which now has resulted into bacterial upper resp infection. CXR from 05/03/2009 was reviewed and there is no indication of any pulmonary process such as worsening COPD or PNA. Will also continue patient on nasonex nasal spray.    Problem # 2:  HYPERTENSION (ICD-401.9) Assessment: Unchanged At baseline. Will continue current regimen, renal function and K wnl.   Her updated medication list for this problem includes:  Norvasc 10 Mg Tabs (Amlodipine besylate) .[name]... Take one tablet daily to reduce blood pressure.    Lasix 40 Mg Tabs (Furosemide) .[name]... Take 1 tablet by mouth once a day    Lisinopril 20 Mg Tabs (Lisinopril) .[name]... Take 1 tablet by mouth once a day  BP today: 142/90 Prior BP: 129/86 (05/03/2009)  Labs Reviewed: K+: 4.4 (03/23/2009) Creat: : 1.01 (03/23/2009)   Chol: 147 (03/23/2009)   HDL: 59 (03/23/2009)   LDL: 58 (03/23/2009)   TG: 149 (03/23/2009)  Problem # 3:  ASTHMA (ICD-493.90) Assessment: Unchanged Patient did not tolerate advair well due to coughing and sneezing. Patient scheduled to have PFTs next week. If patient does have COPD, consider Spiriva? Await pulm recommendations and continue current regimen. Pt does not report using her inhaler more often. Her cough is likely related to an upper resp infection.   The following medications were removed from the medication list:    Advair Diskus 100-50 Mcg/dose Misc (Fluticasone-salmeterol) ..... Inhale 1 puff two times a day Her updated medication list for this problem includes:    Ventolin Hfa 108 (90 Base) Mcg/act Aers (Albuterol sulfate) .[name]... 2 puffs q  6 hours as needed for shortness of breath  Complete Medication List: 1)  Synthroid 175 Mcg Tabs (Levothyroxine sodium) .... Take 1 tablet by mouth once a day 2)  Paroxetine Hcl 20 Mg Tabs (Paroxetine hcl) .... Take one tablet by mouth daily 3)  Wellbutrin Sr 150 Mg Tb12 (Bupropion hcl) .... Take 3 tablets by mouth once a day 4)  Seroquel 300 Mg Tabs (Quetiapine fumarate) .... Take 3 tablets by mouth at night 5)  Valtrex 1 Gm Tabs (Valacyclovir hcl) .... Take 1 tablet by mouth once a day 6)  Norvasc 10 Mg Tabs (Amlodipine besylate) .... Take one tablet daily to reduce blood pressure. 7)  Nexium 40 Mg Cpdr (Esomeprazole magnesium) .... Take 1 tablet by mouth once a day 8)  Mobic 15 Mg Tabs (Meloxicam) .... Take 1 tablet by mouth once a day 9)  Lorazepam 1 Mg Tabs (Lorazepam) .... Take 1 tablet by mouth three times a day as needed 10)  Vicodin Es 7.5-750 Mg Tabs (Hydrocodone-acetaminophen) .... Take one tab every 6-8 hours as needed for pain 11)  Lasix 40 Mg Tabs (Furosemide) .... Take 1 tablet by mouth once a day 12)  Pravastatin Sodium 80 Mg Tabs (Pravastatin sodium) .... Take one tablet at bedtime. 13)  Lisinopril 20 Mg Tabs (Lisinopril) .... Take 1 tablet by mouth once a day 14)  Lactulose Soln (Lactulose) .... Take 57m three times a day, if you developed diarrhea, cut to twice daily. 15)  Flexeril 10 Mg Tabs (Cyclobenzaprine hcl) 16)  Ambien 10 Mg Tabs (Zolpidem tartrate) 17)  Trazodone Hcl 100 Mg Tabs (Trazodone hcl) 18)  Abilify 2 Mg Tabs (Aripiprazole) 19)  Ventolin Hfa 108 (90 Base) Mcg/act Aers (Albuterol sulfate) .... 2 puffs q 6 hours as needed for shortness of breath 20)  Nasonex 50 Mcg/act Susp (Mometasone furoate) .... 2 sprays in each nostril daily 21)  Tessalon Perles 100 Mg Caps (Benzonatate) .... Take one cap as needed for cough 4-6 times a day 22)  Cheratussin Ac 100-10 Mg/56mSyrp (Guaifenesin-codeine) .... Take 1-2 tsp every 4-6 hours as needed for cough. 23)   Azithromycin 250 Mg Tabs (Azithromycin) .... 2 by  mouth today and then 1 daily for 4 days  Patient Instructions: 1)  Please follow up in clinic if your symptoms do not resolve or worsen.  Prescriptions: AZITHROMYCIN 250 MG  TABS (AZITHROMYCIN) 2 by  mouth today and then 1 daily for 4 days  #6 x 0   Entered and Authorized by:   Jolene Provost MD   Signed by:   Jolene Provost MD on 05/17/2009   Method used:   Print then Give to Patient   RxID:   [id number] CHERATUSSIN AC 100-10 MG/5ML SYRP (GUAIFENESIN-CODEINE) Take 1-2 tsp every 4-6 hours as needed for cough.  #1 bottle x 2   Entered and Authorized by:   Jolene Provost MD   Signed by:   Jolene Provost MD on 05/17/2009   Method used:   Print then Give to Patient   RxID:   [id number] TESSALON PERLES 100 MG CAPS (BENZONATATE) Take one cap as needed for cough 4-6 times a day  #25 x 1   Entered and Authorized by:   Jolene Provost MD   Signed by:   Jolene Provost MD on 05/17/2009   Method used:   Print then Give to Patient   RxID:   [id number] AZITHROMYCIN 250 MG  TABS (AZITHROMYCIN) 2 by  mouth today and then 1 daily for 4 days  #6 x 0   Entered and Authorized by:   Jolene Provost MD   Signed by:   Jolene Provost MD on 05/17/2009   Method used:   Print then Give to Patient   RxID:   [id number] CHERATUSSIN AC 100-10 MG/5ML SYRP (GUAIFENESIN-CODEINE) Take 1-2 tsp every 4-6 hours as needed for cough.  #1 bottle x 2   Entered and Authorized by:   Jolene Provost MD   Signed by:   Jolene Provost MD on 05/17/2009   Method used:   Print then Give to Patient   RxID:   [id number] TESSALON PERLES 100 MG CAPS (BENZONATATE) Take one cap as needed for cough 4-6 times a day  #25 x 1   Entered and Authorized by:   Jolene Provost MD   Signed by:   Jolene Provost MD on 05/17/2009   Method used:   Print then Give to Patient   RxID:   [id number]   Prevention & Chronic Care Immunizations   Influenza vaccine: Not documented     Tetanus booster: Not documented    Pneumococcal vaccine: Not documented   Pneumococcal vaccine deferral: Not indicated  (05/03/2009)  Colorectal Screening   Hemoccult: Not documented    Colonoscopy: Not documented   Colonoscopy action/deferral: GI referral  (08/28/2008)  Other Screening   Pap smear: Not documented   Pap smear action/deferral: Not indicated S/P hysterectomy  (08/28/2008)    Mammogram: No specific mammographic evidence of malignancy.  Assessment: BIRADS 1.   (09/07/2008)   Mammogram action/deferral: Screening mammogram in 1 year.     (09/07/2008)   Smoking status: quit  (05/17/2009)  Lipids   Total Cholesterol: 147  (03/23/2009)   Lipid panel action/deferral: Lipid Panel ordered   LDL: 58  (03/23/2009)   LDL Direct: Not documented   HDL: 59  (03/23/2009)   Triglycerides: 149  (03/23/2009)    SGOT (AST): 12  (03/23/2009)   SGPT (ALT): 13  (03/23/2009)   Alkaline phosphatase: 75  (03/23/2009)   Total bilirubin: 0.2  (03/23/2009)    Lipid flowsheet reviewed?: Yes   Progress toward LDL goal: At goal  Hypertension   Last Blood Pressure: 142 / 90  (05/17/2009)   Serum creatinine: 1.01  (03/23/2009)   Serum potassium 4.4  (03/23/2009)    Hypertension flowsheet reviewed?: Yes  Progress toward BP goal: At goal  Self-Management Support :   Personal Goals (by the next clinic visit) :      Personal blood pressure goal: 140/90  (05/17/2009)     Personal LDL goal: 100  (05/03/2009)    Patient will work on the following items until the next clinic visit to reach self-care goals:     Medications and monitoring: take my medicines every day, bring all of my medications to every visit  (05/17/2009)     Eating: drink diet soda or water instead of juice or soda, eat more vegetables, use fresh or frozen vegetables, eat foods that are low in salt, eat baked foods instead of fried foods, eat fruit for snacks and desserts, limit or avoid alcohol  (05/17/2009)      Activity: take a 30 minute walk every day  (11/06/2008)    Hypertension self-management support: Written self-care plan  (05/17/2009)   Hypertension self-care plan printed.    Lipid self-management support: Resources for patients handout  (05/03/2009)     Appended Document: 2WK F/U/WALSH/VS I couldn't find the standard bottle size, so just give 185m please.  Strange that this was written on 3/31 for an acute problem and now just being refilled.  Appended Document: 2WK F/U/WALSH/VS pharmacy [facility]

## 2010-03-19 NOTE — Progress Notes (Signed)
Summary: refill/ hla  Phone Note Refill Request Message from:  Fax from Pharmacy on April 19, 2009 6:02 PM  Refills Requested: Medication #1:  MOBIC 15 MG TABS Take 1 tablet by mouth once a day   Last Refilled: 2/1 last visit and labs 2/4  Initial call taken by: Freddy Finner RN,  April 19, 2009 6:03 PM    Prescriptions: MOBIC 15 MG TABS (MELOXICAM) Take 1 tablet by mouth once a day  #30 x 5   Entered and Authorized by:   Myrtis Ser MD   Signed by:   Myrtis Ser MD on 04/21/2009   Method used:   Electronically to        [facility] [phone]* (retail)       [address]       [place], [place]  [address]       Ph: [phone]       Fax: [phone]   RxID:   [id number]

## 2010-03-19 NOTE — Medication Information (Signed)
Summary: RX HISTORY REPORT  RX HISTORY REPORT   Imported By: Garlan Fillers 10/30/2009 11:37:36  _____________________________________________________________________  External Attachment:    Type:   Image     Comment:   External Document

## 2010-03-19 NOTE — Progress Notes (Signed)
Summary: refill/gg  Phone Note Refill Request  on March 14, 2009 12:26 PM  Refills Requested: Medication #1:  PROVENTIL 54 MCG/ACT AERS Take two puffs every six hours as needed for shortness of breath   Last Refilled: 02/11/2009  Method Requested: Electronic Initial call taken by: Gevena Cotton RN,  March 14, 2009 12:27 PM    Prescriptions: PROVENTIL 90 MCG/ACT AERS (ALBUTEROL) Take two puffs every six hours as needed for shortness of breath  #1 x 5   Entered and Authorized by:   Myrtis Ser MD   Signed by:   Myrtis Ser MD on 03/14/2009   Method used:   Electronically to        [facility] (retail)       [address]       [place], [place]  [address]       Ph: [phone]       Fax: [phone]   RxID:   [id number]

## 2010-03-19 NOTE — Assessment & Plan Note (Signed)
   Allergies: 1)  ! Morphine 2)  ! * Oxycontin   Complete Medication List: 1)  Synthroid 175 Mcg Tabs (Levothyroxine sodium) .... Take 1 tablet by mouth once a day 2)  Paroxetine Hcl 20 Mg Tabs (Paroxetine hcl) .... Take one tablet by mouth daily 3)  Wellbutrin Sr 150 Mg Tb12 (Bupropion hcl) .... Take 3 tablets by mouth once a day 4)  Seroquel 300 Mg Tabs (Quetiapine fumarate) .... Take 3 tablets by mouth at night 5)  Valtrex 1 Gm Tabs (Valacyclovir hcl) .... Take 1 tablet by mouth once a day 6)  Norvasc 10 Mg Tabs (Amlodipine besylate) .... Take one tablet daily to reduce blood pressure. 7)  Nexium 40 Mg Cpdr (Esomeprazole magnesium) .... Take 1 tablet by mouth once a day 8)  Mobic 15 Mg Tabs (Meloxicam) .... Take 1 tablet by mouth once a day 9)  Lorazepam 1 Mg Tabs (Lorazepam) .... Take 1 tablet by mouth three times a day as needed 10)  Vicodin Es 7.5-750 Mg Tabs (Hydrocodone-acetaminophen) .... Take one tab every 6-8 hours as needed for pain 11)  Lasix 40 Mg Tabs (Furosemide) .... Take 1 tablet by mouth once a day 12)  Pravastatin Sodium 80 Mg Tabs (Pravastatin sodium) .... Take one tablet at bedtime. 13)  Lisinopril 20 Mg Tabs (Lisinopril) .... Take 1 tablet by mouth once a day 14)  Lactulose Soln (Lactulose) .... Take 72m three times a day, if you developed diarrhea, cut to twice daily. 15)  Flexeril 10 Mg Tabs (Cyclobenzaprine hcl) 16)  Ambien 10 Mg Tabs (Zolpidem tartrate) 17)  Trazodone Hcl 100 Mg Tabs (Trazodone hcl) 18)  Abilify 2 Mg Tabs (Aripiprazole) 19)  Ventolin Hfa 108 (90 Base) Mcg/act Aers (Albuterol sulfate) .... 2 puffs q 6 hours as needed for shortness of breath 20)  Nasonex 50 Mcg/act Susp (Mometasone furoate) .... 2 sprays in each nostril daily 21)  Tessalon Perles 100 Mg Caps (Benzonatate) .... Take one cap as needed for cough 4-6 times a day 22)  Cheratussin Ac 100-10 Mg/567mSyrp (Guaifenesin-codeine) .... Take 1-2 tsp every 4-6 hours as needed for cough. 23)   Symbicort 80-4.5 Mcg/act Aero (Budesonide-formoterol fumarate) .... One puff bid  Other Orders: Pneumococcal Vaccine ([id number]  Pneumovax Vaccine    Vaccine Type: Pneumovax    Site: left deltoid    Mfr: Merck    Dose: 0.5 ml    Route: IM    Given by: DeHilda Bladesitzler RN    Exp. Date: 03/10/2011    Lot #: [id number]  VIS given: 09/15/95 version given October 12, 2009.

## 2010-03-19 NOTE — Progress Notes (Signed)
Summary: Refill/gh  Phone Note Refill Request Message from:  Fax from Pharmacy on May 23, 2009 10:11 AM  Refills Requested: Medication #1:  PRAVASTATIN SODIUM 80 MG TABS Take one tablet at bedtime.   Last Refilled: 04/19/2009 Last office vist 05/03/2009.  Last Labs were 03/2009.   Method Requested: Electronic Initial call taken by: Sander Nephew RN,  May 23, 2009 10:11 AM    Prescriptions: PRAVASTATIN SODIUM 80 MG TABS (PRAVASTATIN SODIUM) Take one tablet at bedtime.  #32 x 6   Entered and Authorized by:   Myrtis Ser MD   Signed by:   Myrtis Ser MD on 05/23/2009   Method used:   Electronically to        [facility] (retail)       [address]       [place], [place]  [address]       Ph: [phone]       Fax: [phone]   RxID:   [id number]

## 2010-03-19 NOTE — Progress Notes (Signed)
Summary: med refill/gp  Phone Note Refill Request Message from:  Fax from Pharmacy on January 16, 2010 10:20 AM  Refills Requested: Medication #1:  LASIX 40 MG TABS Take 1 tablet by mouth once a day   Last Refilled: 10/31/2009 Last appt. 10/12/09.   Method Requested: Electronic Initial call taken by: Morrison Old RN,  January 16, 2010 10:20 AM  Follow-up for Phone Call        Last seen 8/11 and was to F?U 8 weeks. Was not seen. I sent a flag to Ms Cyndi Bender to schedule an appt.  Follow-up by: Larey Dresser MD,  January 16, 2010 12:26 PM    Prescriptions: LASIX 40 MG TABS (FUROSEMIDE) Take 1 tablet by mouth once a day  #31 x 2   Entered and Authorized by:   Larey Dresser MD   Signed by:   Larey Dresser MD on 01/16/2010   Method used:   Electronically to        [facility] [phone]* (retail)       [address]       [place], [place]  [address]       Ph: [phone]       Fax: [phone]   RxID:   [id number]

## 2010-03-19 NOTE — Progress Notes (Signed)
Summary: ?URI  Phone Note Call from Patient   Caller: Patient Call For: Myrtis Ser MD Summary of Call: Call from pt says thta she has Bronchitis went to Urgent last has taken her antibiotic and says she does not feel any better.  Has cough but no fevers.  Hoarse.Sander Nephew RN  March 22, 2009 3:13 PM  Initial call taken by: Sander Nephew RN,  March 22, 2009 3:13 PM  Follow-up for Phone Call        She was seen for URI in Dec and apparently again at Urgent care.  Highly doubt bacterial infection in cause but due to h/o asthma needs to be seen for additional assessment.  Cough, esp with underlying asthma, can take a long time to resolve  but we may need to adjust her asthma meds. Follow-up by: Larey Dresser MD,  March 22, 2009 3:38 PM

## 2010-03-19 NOTE — Assessment & Plan Note (Signed)
Summary: [name]/F/U VISIT/CH   Vital Signs:  Patient profile:   55 year old female Height:      67 inches (170.18 cm) Weight:      324.6 pounds (147.55 kg) BMI:     51.02 Temp:     98.2 degrees F (36.78 degrees C) oral Pulse rate:   112 / minute BP sitting:   117 / 71  (right arm)  Vitals Entered By: Hilda Blades Ditzler RN (October 12, 2009 3:15 PM) Is Patient Diabetic? No Pain Assessment Patient in pain? yes     Location: left knee Intensity: 9 Type: sharp Onset of pain  long time Nutritional Status BMI of > 30 = obese Nutritional Status Detail appetite ok  Have you ever been in a relationship where you felt threatened, hurt or afraid?denies   Does patient need assistance? Functional Status Self care Ambulation Impaired:Risk for fall Comments Uses a cane. FU - pain left knee worse - sees Dr Sharol Given. ? labs.   Primary Care Provider:  Myrtis Ser MD   History of Present Illness: Follow up appointment: 1. Asthma. Patient reprots frequent diurnal wheezing; denies any nocturnal Sx; fever, chills, cough or sputum. 2. Depression. Denies any SI/HI or mania. [facility]. Next appointment-Septmeber 1st, 2011.   Depression History:      The patient denies a depressed mood most of the day and a diminished interest in her usual daily activities.         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) 7)  Asthma  (ICD-493.90) 8)  Long-term Use of Antiplatelet/antithrombotic  (ICD-V58.63) 9)   Shortness of Breath  (ICD-786.05) 10)  Encounter For Long-term Use of Anticoagulants  (ICD-V58.61) 11)  Obesity  (ICD-278.00) 12)  Pancreatitis, Hx of  (ICD-V12.70) 13)  Gerd  (ICD-530.81) 14)  Depression  (ICD-311) 15)  Fatigue  (ICD-780.79) 16)  Family History of Cad Female 1st Degree Relative <50  (ICD-V17.3) 17)  Constipation  (ICD-564.00) 18)  Dizziness  (ICD-780.4) 19)  Conjunctivitis  (ICD-372.30) 20)  Nosebleed  (ICD-784.7) 21)  Uri  (ICD-465.9) 22)  Health Maintenance Exam  (ICD-V70.0) 23)  Symptom, Tachycardia Nos  (ICD-785.0) 24)  Knee Pain  (ICD-719.46) 25)  Lump or Mass in Breast  (ICD-611.72)  Current Problems (verified): 1)  Special Screening For Malignant Neoplasms Colon  (ICD-V76.51) 2)  Cough  (ICD-786.2) 3)  Hyperlipidemia  (ICD-272.4) 4)  Hypothyroidism  (ICD-244.9) 5)  Hypertension  (ICD-401.9) 6)  Hsv  (ICD-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) 7)  Asthma  (ICD-493.90) 8)  Long-term Use of Antiplatelet/antithrombotic  (ICD-V58.63) 9)  Shortness of Breath  (ICD-786.05) 10)  Encounter For Long-term Use of Anticoagulants  (ICD-V58.61) 11)  Obesity  (ICD-278.00) 12)  Pancreatitis, Hx of  (ICD-V12.70) 13)  Gerd  (ICD-530.81) 14)  Depression  (ICD-311) 15)  Fatigue  (ICD-780.79) 16)  Family History of Cad Female 1st Degree Relative <50  (ICD-V17.3) 17)  Constipation  (ICD-564.00) 18)  Dizziness  (  ICD-780.4) 19)  Conjunctivitis  (ICD-372.30) 20)  Nosebleed  (ICD-784.7) 21)  Uri  (ICD-465.9) 22)  Health Maintenance Exam  (ICD-V70.0) 23)  Symptom, Tachycardia Nos  (ICD-785.0) 24)  Knee Pain  (ICD-719.46) 25)  Lump or Mass in Breast  (ICD-611.72)  Medications Prior to Update: 1)  Synthroid 175 Mcg Tabs (Levothyroxine Sodium) .... Take 1 Tablet By Mouth Once A Day 2)  Paroxetine Hcl 20 Mg  Tabs (Paroxetine Hcl) .... Take One Tablet By Mouth Daily 3)  Wellbutrin Sr 150 Mg Tb12 (Bupropion Hcl) .... Take 3 Tablets By Mouth Once A Day 4)  Seroquel 300 Mg Tabs (Quetiapine Fumarate) .... Take 3  Tablets By Mouth At Night 5)  Valtrex 1 Gm Tabs (Valacyclovir Hcl) .... Take 1 Tablet By Mouth Once A Day 6)  Norvasc 10 Mg Tabs (Amlodipine Besylate) .... Take One Tablet Daily To Reduce Blood Pressure. 7)  Nexium 40 Mg  Cpdr (Esomeprazole Magnesium) .... Take 1 Tablet By Mouth Once A Day 8)  Mobic 15 Mg Tabs (Meloxicam) .... Take 1 Tablet By Mouth Once A Day 9)  Lorazepam 1 Mg Tabs (Lorazepam) .... Take 1 Tablet By Mouth Three Times A Day As Needed 10)  Vicodin Es 7.5-750 Mg Tabs (Hydrocodone-Acetaminophen) .... Take One Tab Every 6-8 Hours As Needed For Pain 11)  Lasix 40 Mg Tabs (Furosemide) .... Take 1 Tablet By Mouth Once A Day 12)  Pravastatin Sodium 80 Mg Tabs (Pravastatin Sodium) .... Take One Tablet At Bedtime. 13)  Lisinopril 20 Mg Tabs (Lisinopril) .... Take 1 Tablet By Mouth Once A Day 14)  Lactulose  Soln (Lactulose) .... Take 102m Three Times A Day, If You Developed Diarrhea, Cut To Twice Daily. 15)  Flexeril 10 Mg Tabs (Cyclobenzaprine Hcl) 16)  Ambien 10 Mg Tabs (Zolpidem Tartrate) 17)  Trazodone Hcl 100 Mg Tabs (Trazodone Hcl) 18)  Abilify 2 Mg Tabs (Aripiprazole) 19)  Ventolin Hfa 108 (90 Base) Mcg/act Aers (Albuterol Sulfate) .... 2 Puffs Q 6 Hours As Needed For Shortness of Breath 20)  Nasonex 50 Mcg/act Susp (Mometasone Furoate) .... 2 Sprays in Each Nostril Daily 21)  Tessalon Perles 100 Mg Caps (Benzonatate) .... Take One Cap As Needed For Cough 4-6 Times A Day 22)  Cheratussin Ac 100-10 Mg/564mSyrp (Guaifenesin-Codeine) .... Take 1-2 Tsp Every 4-6 Hours As Needed For Cough.  Current Medications (verified): 1)  Synthroid 175 Mcg Tabs (Levothyroxine Sodium) .... Take 1 Tablet By Mouth Once A Day 2)  Paroxetine Hcl 20 Mg  Tabs (Paroxetine Hcl) .... Take One Tablet By Mouth Daily 3)  Wellbutrin Sr 150 Mg Tb12 (Bupropion Hcl) .... Take 3 Tablets By Mouth Once A Day 4)  Seroquel 300 Mg Tabs (Quetiapine Fumarate) .... Take 3 Tablets By Mouth At Night 5)  Valtrex 1 Gm Tabs  (Valacyclovir Hcl) .... Take 1 Tablet By Mouth Once A Day 6)  Norvasc 10 Mg Tabs (Amlodipine Besylate) .... Take One Tablet Daily To Reduce Blood Pressure. 7)  Nexium 40 Mg  Cpdr (Esomeprazole Magnesium) .... Take 1 Tablet By Mouth Once A Day 8)  Mobic 15 Mg Tabs (Meloxicam) .... Take 1 Tablet By Mouth Once A Day 9)  Lorazepam 1 Mg Tabs (Lorazepam) .... Take 1 Tablet By Mouth Three Times A Day As Needed 10)  Vicodin Es 7.5-750 Mg Tabs (Hydrocodone-Acetaminophen) .... Take One Tab Every 6-8 Hours As Needed For Pain 11)  Lasix 40 Mg Tabs (Furosemide) .... Take 1 Tablet By Mouth Once A Day 12)  Pravastatin  Sodium 80 Mg Tabs (Pravastatin Sodium) .... Take One Tablet At Bedtime. 13)  Lisinopril 20 Mg Tabs (Lisinopril) .... Take 1 Tablet By Mouth Once A Day 14)  Lactulose  Soln (Lactulose) .... Take 31m Three Times A Day, If You Developed Diarrhea, Cut To Twice Daily. 15)  Flexeril 10 Mg Tabs (Cyclobenzaprine Hcl) 16)  Ambien 10 Mg Tabs (Zolpidem Tartrate) 17)  Trazodone Hcl 100 Mg Tabs (Trazodone Hcl) 18)  Abilify 2 Mg Tabs (Aripiprazole) 19)  Ventolin Hfa 108 (90 Base) Mcg/act Aers (Albuterol Sulfate) .... 2 Puffs Q 6 Hours As Needed For Shortness of Breath 20)  Nasonex 50 Mcg/act Susp (Mometasone Furoate) .... 2 Sprays in Each Nostril Daily 21)  Tessalon Perles 100 Mg Caps (Benzonatate) .... Take One Cap As Needed For Cough 4-6 Times A Day 22)  Cheratussin Ac 100-10 Mg/574mSyrp (Guaifenesin-Codeine) .... Take 1-2 Tsp Every 4-6 Hours As Needed For Cough. 23)  Symbicort 80-4.5 Mcg/act Aero (Budesonide-Formoterol Fumarate) .... One Puff Bid  Allergies: 1)  ! Morphine 2)  ! 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, with revision 06/09 s/p right foot surgery 2007  Past  Surgical History: Last updated: 08/04/2007 Lumpectomy Right ankle reconstructive procedure Total knee replacement-right  Family History: Last updated: 02/07/2008 Family History of CAD Female 1st degree relative <50 Brother with MI at 3565per patient no drug use)  Social History: Last updated: 02/07/2008 Occupation: disabled waBiochemist, clinicalingle Quit smoking 1994 (10pyhx) Alcohol use-no Drug use-no Regular exercise-no  Risk Factors: Alcohol Use: 0 (10/12/2009) Exercise: yes (10/12/2009)  Risk Factors: Smoking Status: quit (10/12/2009) Passive Smoke Exposure: no (10/12/2009)  Patient is new to me and therefore a complete Hx was reveiwed with the patient.  Physical Exam  General:  Vs notedalert, well-nourished, well-hydrated, and overweight-appearing.   Head:  no abnormalities observed.   Eyes:  vision grossly intact.   Nose:  no external deformity, no nasal discharge, no mucosal pallor, no mucosal edema, no airflow obstruction, no nasal polyps, and no nasal mucosal lesions.   Mouth:  pharynx pink and moist and fair dentition.   Neck:  No deformities, masses, or tenderness noted. Lungs:  Normal respiratory effort, chest expands symmetrically. Lungs are clear to auscultation, no crackles or wheezes. Heart:  Normal rate and regular rhythm. S1 and S2 normal without gallop, murmur, click, rub or other extra sounds. Abdomen:  soft, non-tender, normal bowel sounds, and no masses.   Msk:  No deformity or scoliosis noted of thoracic or lumbar spine.   Pulses:  Pedal pulses 2+/4 bilaterally. Extremities:  no cyanosis, clubbing or edema bilaterally Neurologic:  alert & oriented X3 and gait normal.   Skin:  turgor normal, no rashes, and no suspicious lesions.   Psych:  Oriented X3, memory intact for recent and remote, normally interactive, good eye contact, not anxious appearing, not depressed appearing, not agitated, not suicidal, not homicidal, and flat affect.     Impression &  Recommendations:  Problem # 1:  ASTHMA (ICD-493.90) Assessment Deteriorated Poorly controlled. Per patient, she did not tolerate Flovent and Advair (gave her nausea). Will try Symbicort. Gave a coupon for a free sample. Instructged to call 911 or go to ED if Sx worsen. Also reviewed  use of a chamber flow. Her updated medication list for this problem includes:  Ventolin Hfa 108 (90 Base) Mcg/act Aers (Albuterol sulfate) .[name]... 2 puffs q 6 hours as needed for shortness of breath    Symbicort 80-4.5 Mcg/act Aero (Budesonide-formoterol fumarate) ..... One puff bid  Pulmonary Functions Reviewed: O2 sat: 96 (05/17/2009)  Problem # 2:  OBESITY (ICD-278.00) Counselled on nutrition and exercise.n Risks of obesity reviewed with the a patient. Ht: 67 (10/12/2009)   Wt: 324.6 (10/12/2009)   BMI: 51.02 (10/12/2009)  Problem # 3:  HYPOTHYROIDISM (ICD-244.9) Assessment: Unchanged Patient is asymptomatic. will continue to follow. Her updated medication list for this problem includes:    Synthroid 175 Mcg Tabs (Levothyroxine sodium) .[name]... Take 1 tablet by mouth once a day  Labs Reviewed: TSH: 0.744 (03/23/2009)    Chol: 147 (03/23/2009)   HDL: 59 (03/23/2009)   LDL: 58 (03/23/2009)   TG: 149 (03/23/2009)  Problem # 4:  DEPRESSION (ICD-311)  Stable. Has a follow up appointment with her psychiatrist on September 1st, 2011. Her updated medication list for this problem includes:    Paroxetine Hcl 20 Mg Tabs (Paroxetine hcl) .[name]... Take one tablet by mouth daily    Wellbutrin Sr 150 Mg Tb12 (Bupropion hcl) .[name]... Take 3 tablets by mouth once a day    Lorazepam 1 Mg Tabs (Lorazepam) .[name]... Take 1 tablet by mouth three times a day as needed    Trazodone Hcl 100 Mg Tabs (Trazodone hcl)  Discussed treatment options, including trial of antidpressant medication. Will refer to behavioral health. Follow-up call in in 24-48 hours and recheck in 2 weeks, sooner as needed. Patient agrees to call if any worsening  of symptoms or thoughts of doing harm arise. Verified that the patient has no suicidal ideation at this time.   Complete Medication List: 1)  Synthroid 175 Mcg Tabs (Levothyroxine sodium) .... Take 1 tablet by mouth once a day 2)  Paroxetine Hcl 20 Mg Tabs (Paroxetine hcl) .... Take one tablet by mouth daily 3)  Wellbutrin Sr 150 Mg Tb12 (Bupropion hcl) .... Take 3 tablets by mouth once a day 4)  Seroquel 300 Mg Tabs (Quetiapine fumarate) .... Take 3 tablets by mouth at night 5)  Valtrex 1 Gm Tabs (Valacyclovir hcl) .... Take 1 tablet by mouth once a day 6)  Norvasc 10 Mg Tabs (Amlodipine besylate) .... Take one tablet daily to reduce blood pressure. 7)  Nexium 40 Mg Cpdr (Esomeprazole magnesium) .... Take 1 tablet by mouth once a day 8)  Mobic 15 Mg Tabs (Meloxicam) .... Take 1 tablet by mouth once a day 9)  Lorazepam 1 Mg Tabs (Lorazepam) .... Take 1 tablet by mouth three times a day as needed 10)  Vicodin Es 7.5-750 Mg Tabs (Hydrocodone-acetaminophen) .... Take one tab every 6-8 hours as needed for pain 11)  Lasix 40 Mg Tabs (Furosemide) .... Take 1 tablet by mouth once a day 12)  Pravastatin Sodium 80 Mg Tabs (Pravastatin sodium) .... Take one tablet at bedtime. 13)  Lisinopril 20 Mg Tabs (Lisinopril) .... Take 1 tablet by mouth once a day 14)  Lactulose Soln (Lactulose) .... Take 68m three times a day, if you developed diarrhea, cut to twice daily. 15)  Flexeril 10 Mg Tabs (Cyclobenzaprine hcl) 16)  Ambien 10 Mg Tabs (Zolpidem tartrate) 17)  Trazodone Hcl 100 Mg Tabs (Trazodone hcl) 18)  Abilify 2 Mg Tabs (Aripiprazole) 19)  Ventolin Hfa 108 (90 Base) Mcg/act Aers (Albuterol sulfate) .... 2 puffs q 6 hours as needed for shortness of breath 20)  Nasonex 50 Mcg/act Susp (Mometasone  furoate) .... 2 sprays in each nostril daily 21)  Tessalon Perles 100 Mg Caps (Benzonatate) .... Take one cap as needed for cough 4-6 times a day 22)  Cheratussin Ac 100-10 Mg/67m Syrp (Guaifenesin-codeine)  .... Take 1-2 tsp every 4-6 hours as needed for cough. 23)  Symbicort 80-4.5 Mcg/act Aero (Budesonide-formoterol fumarate) .... One puff bid  Other Orders: Influenza Vaccine MCR ((40981 Gastroenterology Referral (GI) Mammogram (Screening) (Mammo)  Patient Instructions: 1)  Please, use Albuterol on as needed basis only. 2)  Pick up new prescription for Symbicort. 3)  Please,call with any questions. 4)  Please, follow up with Dr. BHarlow Maresin 8 weeks. Prescriptions: SYMBICORT 80-4.5 MCG/ACT AERO (BUDESONIDE-FORMOTEROL FUMARATE) One puff bid  #1 x 11   Entered and Authorized by:   NMilana ObeyMD   Signed by:   NMilana ObeyMD on 10/12/2009   Method used:   Electronically to        [facility][phone] (retail)       [address]      [place]  [address]      Ph: [phone]      Fax: [phone]  RxID:   [id number]  Prevention & Chronic Care Immunizations   Influenza vaccine: Fluvax MCR  (10/12/2009)    Tetanus booster: Not documented   Td booster deferral: Not indicated  (10/12/2009)   Tetanus booster due: 09/17/2017    Pneumococcal vaccine: Not documented   Pneumococcal vaccine deferral: Not indicated  (05/03/2009)   Pneumococcal vaccine due: 05/10/2021  Colorectal Screening   Hemoccult: Not documented    Colonoscopy: Not documented   Colonoscopy action/deferral: GI referral  (10/12/2009)  Other Screening   Pap smear: Not documented   Pap smear action/deferral: Not indicated S/P hysterectomy  (08/28/2008)    Mammogram: No specific mammographic evidence of malignancy.  Assessment: BIRADS 1.   (09/07/2008)   Mammogram action/deferral: Ordered  (10/12/2009)   Smoking status: quit  (10/12/2009)  Lipids   Total Cholesterol: 147  (03/23/2009)   Lipid panel action/deferral: Lipid Panel ordered   LDL: 58  (03/23/2009)   LDL Direct: Not documented   HDL: 59  (03/23/2009)   Triglycerides: 149  (03/23/2009)   Lipid panel due:  04/20/2009    SGOT (AST): 12  (03/23/2009)   SGPT (ALT): 13  (03/23/2009)   Alkaline phosphatase: 75  (03/23/2009)   Total bilirubin: 0.2  (03/23/2009)   Liver panel due: 03/23/2010    Lipid flowsheet reviewed?: Yes   Progress toward LDL goal: Unchanged  Hypertension   Last Blood Pressure: 117 / 71  (10/12/2009)   Serum creatinine: 1.01  (03/23/2009)   Serum potassium 4.4  (040/34/7425   Basic metabolic panel due: 095/63/8756   Hypertension flowsheet reviewed?: Yes   Progress toward BP goal: At goal    Stage of readiness to change (hypertension management): Maintenance  Self-Management Support :   Personal Goals (by the next clinic visit) :      Personal blood pressure goal: 140/90  (05/17/2009)     Personal LDL goal: 100  (05/03/2009)    Patient will work on the following items until the next clinic visit to reach self-care goals:     Medications and monitoring: take my medicines every day, check my blood pressure, weigh myself weekly  (10/12/2009)     Eating: eat more vegetables, use fresh or frozen vegetables, eat foods that are low in salt, eat baked foods instead of fried  foods, eat fruit for snacks and desserts, limit or avoid alcohol  (10/12/2009)     Activity: take a 30 minute walk every day  (11/06/2008)    Hypertension self-management support: Written self-care plan, Education handout, Resources for patients handout  (10/12/2009)   Hypertension self-care plan printed.   Hypertension education handout printed    Lipid self-management support: Written self-care plan, Education handout, Resources for patients handout  (10/12/2009)   Lipid self-care plan printed.   Lipid education handout printed      Resource handout printed.   Nursing Instructions: GI referral for screening colonoscopy (see order) Schedule screening mammogram (see order)     Influenza Vaccine    Vaccine Type: Fluvax MCR    Site: right deltoid    Mfr: GlaxoSmithKline    Dose: 0.5 ml     Route: IM    Given by: Hilda Blades Ditzler RN    Exp. Date: 08/17/2010    Lot #: [id number]    VIS given: 09/10/06 version given October 12, 2009.  Flu Vaccine Consent Questions    Do you have a history of severe allergic reactions to this vaccine? no    Any prior history of allergic reactions to egg and/or gelatin? no    Do you have a sensitivity to the preservative Thimersol? no    Do you have a past history of Guillan-Barre Syndrome? no    Do you currently have an acute febrile illness? no    Have you ever had a severe reaction to latex? no    Vaccine information given and explained to patient? yes    Are you currently pregnant? no

## 2010-03-19 NOTE — Progress Notes (Signed)
Summary: refill/ hla  Phone Note Refill Request Message from:  Patient on July 23, 2009 1:36 PM  Refills Requested: Medication #1:  VICODIN ES 7.5-750 MG TABS Take one tab every 6-8 hours as needed for pain   Dosage confirmed as above?Dosage Confirmed   Last Refilled: 03/2009 Initial call taken by: Freddy Finner RN,  July 23, 2009 1:37 PM  Follow-up for Phone Call        Refill approved-nurse to complete  Additional Follow-up for Phone Call Additional follow up Details #1::        Rx called to pharmacy Additional Follow-up by: Freddy Finner RN,  July 25, 2009 1:04 PM    Prescriptions: VICODIN ES 7.5-750 MG TABS (HYDROCODONE-ACETAMINOPHEN) Take one tab every 6-8 hours as needed for pain  #120 x 0   Entered and Authorized by:   Myrtis Ser MD   Signed by:   Myrtis Ser MD on 07/24/2009   Method used:   Telephoned to ...       [facility] [phone]* (retail)       [address]       [place], [place]  [address]       Ph: [phone]       Fax: [phone]   RxID:   [id number]

## 2010-03-19 NOTE — Progress Notes (Signed)
Summary: Refill/gh  Phone Note Refill Request Message from:  Fax from Pharmacy on Jun 19, 2009 3:25 PM  Refills Requested: Medication #1:  SYNTHROID 175 MCG TABS Take 1 tablet by mouth once a day   Last Refilled: 05/20/2009  Method Requested: Electronic Initial call taken by: Sander Nephew RN,  Jun 19, 2009 3:26 PM    Prescriptions: SYNTHROID 175 MCG TABS (LEVOTHYROXINE SODIUM) Take 1 tablet by mouth once a day  #31 x 6   Entered and Authorized by:   Myrtis Ser MD   Signed by:   Myrtis Ser MD on 06/20/2009   Method used:   Electronically to        [facility] [phone]* (retail)       [address]       [place], [place]  [address]       Ph: [phone]       Fax: [phone]   RxID:   [id number]

## 2010-03-19 NOTE — Progress Notes (Signed)
Summary: refill/gg  Phone Note Refill Request  on August 22, 2009 12:17 PM  Refills Requested: Medication #1:  NORVASC 10 MG TABS Take one tablet daily to reduce blood pressure.   Last Refilled: 07/18/2009  Medication #2:  VICODIN ES 7.5-750 MG TABS Take one tab every 6-8 hours as needed for pain   Last Refilled: 07/25/2009  Method Requested: Electronic Initial call taken by: Gevena Cotton RN,  August 22, 2009 12:17 PM  Follow-up for Phone Call        Refill approved-nurse to complete.  Please schedule a follow-up appointment and notify patient. Follow-up by: Bertha Stakes MD,  August 22, 2009 3:29 PM  Additional Follow-up for Phone Call Additional follow up Details #1::        Rx faxed to pharmacy Additional Follow-up by: Gevena Cotton RN,  August 24, 2009 5:23 PM    Prescriptions: VICODIN ES 7.5-750 MG TABS (HYDROCODONE-ACETAMINOPHEN) Take one tab every 6-8 hours as needed for pain  #120 x 0   Entered and Authorized by:   Bertha Stakes MD   Signed by:   Bertha Stakes MD on 08/22/2009   Method used:   Telephoned to ...       [facility] [phone]* (retail)       [place], [place]  [address]       Ph: [phone]       Fax: [phone]   RxID:   [id number] NORVASC 10 MG TABS (AMLODIPINE BESYLATE) Take one tablet daily to reduce blood pressure.  #32 x 1   Entered and Authorized by:   Bertha Stakes MD   Signed by:   Bertha Stakes MD on 08/22/2009   Method used:   Telephoned to ...       [facility] [phone]* (retail)       [address]       [place], [place]  [address]       Ph: [phone]       Fax: [phone]   RxID:   [id number]

## 2010-03-19 NOTE — Assessment & Plan Note (Signed)
Summary: ACUTE-CONGESTION,BAD COLD/([facility]ASLH)/CFB   Vital Signs:  Patient profile:   55 year old female Height:      67 inches (170.18 cm) Weight:      336.0 pounds (152.73 kg) BMI:     52.82 Temp:     98.6 degrees F (37.00 degrees C) oral Pulse rate:   115 / minute BP sitting:   129 / 86  (right arm) Cuff size:   WRIST/REG  Vitals Entered By: [name] NT II (May 03, 2009 10:57 AM) CC: PAIN ALL OVER / STILL HAVING THE SAME PAIN AND CONGESTION AS BEFORE / [place], Depression Is Patient Diabetic? No Pain Assessment Patient in pain? yes     Location: ALL OVER Intensity:       10 Type: HEAVY Onset of pain  SINCE HER LAST OFFICE VISIT /  HASN'T GOTTEN BETTER Nutritional Status BMI of > 30 = obese  Have you ever been in a relationship where you felt threatened, hurt or afraid?No   Does patient need assistance? Functional Status Self care Ambulation Normal Comments STILL HAVING THE SAME PAIN AND CONGESTION SINCE HER LAST OFFICE VISIT/ PRODUCTIVE COUGH-WHITE   Primary Care Provider:  Myrtis Ser MD  CC:  PAIN ALL OVER / STILL HAVING THE SAME PAIN AND CONGESTION AS BEFORE / PRODUCTIVE COUGH- CLEAR and Depression.  History of Present Illness: [name] is a 55 year old Female with PMH/problems as outlined in the EMR, who presents to the [facility] with chief complaint(s) of:    -- cough: patient has had "bronchitis" since nov 2010 and seems to be going on and on. coughing up phlegm, mucus from the nose, has shortness of breath and some tightness in the chest. No wheezing. No fever or chills, but sometimes feels hot inside. Takes nexium for acid reflux, and that seems to help with it. She was treated with doxy, amoxy and zithromax over this period of time. Ex-smoker, quit 10 years ago. No weight loss.     Depression History:      The patient is having a depressed mood most of the day and has a diminished interest in her usual daily activities.        The  patient denies that she feels like life is not worth living, denies that she wishes that she were dead, and denies that she has thought about ending her life.        Comments:  MAINLY DEPRESSED DUE TO BEING IN PAIN AND MEDICAL CONDITION. NOT ABLE TO DO THINGS SHE USED TO DO.   Preventive Screening-Counseling & Management  Alcohol-Tobacco     Alcohol drinks/day: 0     Smoking Status: quit     Year Quit: 1994     Passive Smoke Exposure: no  Caffeine-Diet-Exercise     Does Patient Exercise: yes     Type of exercise: WALKING     Times/week:   3  Current Medications (verified): 1)  Synthroid 175 Mcg Tabs (Levothyroxine Sodium) .... Take 1 Tablet By Mouth Once A Day 2)  Paroxetine Hcl 20 Mg  Tabs (Paroxetine Hcl) .... Take One Tablet By Mouth Daily 3)  Wellbutrin Sr 150 Mg Tb12 (Bupropion Hcl) .... Take 3 Tablets By Mouth Once A Day 4)  Seroquel 300 Mg Tabs (Quetiapine Fumarate) .... Take 3 Tablets By Mouth At Night 5)  Valtrex 1 Gm Tabs (Valacyclovir Hcl) .... Take 1 Tablet By Mouth Once A Day 6)  Norvasc 10 Mg Tabs (Amlodipine Besylate) .... Take  One Tablet Daily To Reduce Blood Pressure. 7)  Nexium 40 Mg  Cpdr (Esomeprazole Magnesium) .... Take 1 Tablet By Mouth Once A Day 8)  Mobic 15 Mg Tabs (Meloxicam) .... Take 1 Tablet By Mouth Once A Day 9)  Lorazepam 1 Mg Tabs (Lorazepam) .... Take 1 Tablet By Mouth Three Times A Day As Needed 10)  Vicodin Es 7.5-750 Mg Tabs (Hydrocodone-Acetaminophen) .... Take One Tab Every 6-8 Hours As Needed For Pain 11)  Lasix 40 Mg Tabs (Furosemide) .... Take 1 Tablet By Mouth Once A Day 12)  Pravastatin Sodium 80 Mg Tabs (Pravastatin Sodium) .... Take One Tablet At Bedtime. 13)  Lisinopril 20 Mg Tabs (Lisinopril) .... Take 1 Tablet By Mouth Once A Day 14)  Lactulose  Soln (Lactulose) .... Take 42m Three Times A Day, If You Developed Diarrhea, Cut To Twice Daily. 15)  Advair Diskus 100-50 Mcg/dose Misc (Fluticasone-Salmeterol) .... Inhale 1 Puff Two Times A  Day 16)  Flexeril 10 Mg Tabs (Cyclobenzaprine Hcl) 17)  Ambien 10 Mg Tabs (Zolpidem Tartrate) 18)  Trazodone Hcl 100 Mg Tabs (Trazodone Hcl) 19)  Abilify 2 Mg Tabs (Aripiprazole) 20)  Potassium Gluconate 595 Mg Tabs (Potassium Gluconate) 21)  Ventolin Hfa 108 (90 Base) Mcg/act Aers (Albuterol Sulfate) .... 2 Puffs Q 6 Hours As Needed For Shortness of Breath 22)  Loratadine 10 Mg Tabs (Loratadine) .... Take 1 Tablet By Mouth Once A Day 23)  Nasonex 50 Mcg/act Susp (Mometasone Furoate) .... 2 Sprays in Each Nostril Daily 24)  Tessalon Perles 100 Mg Caps (Benzonatate) .... Take One Cap As Needed For Cough 4-6 Times A Day  Allergies (verified): 1)  ! Morphine 2)  ! * Oxycontin  Past History:  Past Medical History: Last updated: 04/04/2008 Depression GERD Hypertension Hypothyroidism Pancreatitis, hx of Knee Pain/foot pain Obesity Breast Lump s/p right knee replacement 5/09, with revision 06/09 s/p right foot surgery 2007  Past Surgical History: Last updated: 08/04/2007 Lumpectomy Right ankle reconstructive procedure Total knee replacement-right  Family History: Last updated: 02/07/2008 Family History of CAD Female 1st degree relative <50 Brother with MI at 336(per patient no drug use)  Social History: Last updated: 02/07/2008 Occupation: disabled wBiochemist, clinicalSingle Quit smoking 1994 (10pyhx) Alcohol use-no Drug use-no Regular exercise-no  Risk Factors: Alcohol Use: 0 (05/03/2009) Exercise: yes (05/03/2009)  Risk Factors: Smoking Status: quit (05/03/2009) Passive Smoke Exposure: no (05/03/2009)  Review of Systems       As per HPI  Physical Exam  General:  alert and overweight-appearing.   Head:  normocephalic and atraumatic.   Eyes:  pupils round and pupils reactive to light.   Ears:  no external deformities.   Nose:  no external deformity.   Mouth:  pharynx pink and moist and no erythema.   Lungs:  bilateral good air entry, breath sounds vesicular  with prolonged expiration, no added sounds Heart:  normal rate and regular rhythm.   Abdomen:  soft and non-tender.  obese Pulses:  normal peripheral pulses  Extremities:  no cyanosis, clubbing or edema  Psych:  normally interactive.     Impression & Recommendations:  Problem # 1:  COUGH ([id number]) This appears to be chronic and persistent. She has been treated with three courses of abx with partial relief. Differentials at this time incl: post viral, post nasal drip, reflux, asthma. She has been on ACEi for a long time, so I doubt if that's the case. I will check a CXR today and also plan to get a  PFT. Will give her nasonex and tessalon perles, ask to continue PPI and inhalers.  Orders: CXR- 2view (CXR) PFT Baseline-Pre/Post Bronchodiolator (PFT Baseline-Pre/Pos)  Problem # 2:  HYPERTENSION (ICD-401.9) Well-controlled. Continue the current regimen.   Her updated medication list for this problem includes:    Norvasc 10 Mg Tabs (Amlodipine besylate) .[name]... Take one tablet daily to reduce blood pressure.    Lasix 40 Mg Tabs (Furosemide) .[name]... Take 1 tablet by mouth once a day    Lisinopril 20 Mg Tabs (Lisinopril) .[name]... Take 1 tablet by mouth once a day  Problem # 3:  HYPERLIPIDEMIA (ICD-272.4) Well-controlled. Continue the current regimen.   Her updated medication list for this problem includes:    Pravastatin Sodium 80 Mg Tabs (Pravastatin sodium) .[name]... Take one tablet at bedtime.  Complete Medication List: 1)  Synthroid 175 Mcg Tabs (Levothyroxine sodium) .... Take 1 tablet by mouth once a day 2)  Paroxetine Hcl 20 Mg Tabs (Paroxetine hcl) .... Take one tablet by mouth daily 3)  Wellbutrin Sr 150 Mg Tb12 (Bupropion hcl) .... Take 3 tablets by mouth once a day 4)  Seroquel 300 Mg Tabs (Quetiapine fumarate) .... Take 3 tablets by mouth at night 5)  Valtrex 1 Gm Tabs (Valacyclovir hcl) .... Take 1 tablet by mouth once a day 6)  Norvasc 10 Mg Tabs (Amlodipine besylate) .... Take one  tablet daily to reduce blood pressure. 7)  Nexium 40 Mg Cpdr (Esomeprazole magnesium) .... Take 1 tablet by mouth once a day 8)  Mobic 15 Mg Tabs (Meloxicam) .... Take 1 tablet by mouth once a day 9)  Lorazepam 1 Mg Tabs (Lorazepam) .... Take 1 tablet by mouth three times a day as needed 10)  Vicodin Es 7.5-750 Mg Tabs (Hydrocodone-acetaminophen) .... Take one tab every 6-8 hours as needed for pain 11)  Lasix 40 Mg Tabs (Furosemide) .... Take 1 tablet by mouth once a day 12)  Pravastatin Sodium 80 Mg Tabs (Pravastatin sodium) .... Take one tablet at bedtime. 13)  Lisinopril 20 Mg Tabs (Lisinopril) .... Take 1 tablet by mouth once a day 14)  Lactulose Soln (Lactulose) .... Take 82m three times a day, if you developed diarrhea, cut to twice daily. 15)  Advair Diskus 100-50 Mcg/dose Misc (Fluticasone-salmeterol) .... Inhale 1 puff two times a day 16)  Flexeril 10 Mg Tabs (Cyclobenzaprine hcl) 17)  Ambien 10 Mg Tabs (Zolpidem tartrate) 18)  Trazodone Hcl 100 Mg Tabs (Trazodone hcl) 19)  Abilify 2 Mg Tabs (Aripiprazole) 20)  Potassium Gluconate 595 Mg Tabs (Potassium gluconate) 21)  Ventolin Hfa 108 (90 Base) Mcg/act Aers (Albuterol sulfate) .... 2 puffs q 6 hours as needed for shortness of breath 22)  Loratadine 10 Mg Tabs (Loratadine) .... Take 1 tablet by mouth once a day 23)  Nasonex 50 Mcg/act Susp (Mometasone furoate) .... 2 sprays in each nostril daily 24)  Tessalon Perles 100 Mg Caps (Benzonatate) .... Take one cap as needed for cough 4-6 times a day  Patient Instructions: 1)  Please schedule a follow-up appointment in 2 weeks. 2)  We will let you know if anything wrong with your lab work.  3)  Do let uKoreaknow if your problem worsens.   Prescriptions: TESSALON PERLES 100 MG CAPS (BENZONATATE) Take one cap as needed for cough 4-6 times a day  #25 x 1   Entered and Authorized by:   ADawna PartMD   Signed by:   ADawna PartMD on 05/03/2009   Method used:  Electronically to         [facility] (retail)       [address]       [place], [place]  [address]       Ph: [phone]       Fax: [phone]   RxID:   [id number] NASONEX 50 MCG/ACT SUSP (MOMETASONE FUROATE) 2 sprays in each nostril daily  #1 bottle x 1   Entered and Authorized by:   Dawna Part MD   Signed by:   Dawna Part MD on 05/03/2009   Method used:   [facility]        [facility]       [address]       [place], [place]  [address]       Ph: [phone]       Fax: [phone]   RxID:   [id number]   Prevention & Chronic Care Immunizations   Influenza vaccine: Not documented    Tetanus booster: Not documented    Pneumococcal vaccine: Not documented   Pneumococcal vaccine deferral: Not indicated  (05/03/2009)  Colorectal Screening   Hemoccult: Not documented    Colonoscopy: Not documented   Colonoscopy action/deferral: GI referral  (08/28/2008)  Other Screening   Pap smear: Not documented   Pap smear action/deferral: Not indicated S/P hysterectomy  (08/28/2008)    Mammogram: No specific mammographic evidence of malignancy.  Assessment: BIRADS 1.   (09/07/2008)   Mammogram action/deferral: Screening mammogram in 1 year.     (09/07/2008)   Smoking status: quit  (05/03/2009)  Lipids   Total Cholesterol: 147  (03/23/2009)   Lipid panel action/deferral: Lipid Panel ordered   LDL: 58  (03/23/2009)   LDL Direct: Not documented   HDL: 59  (03/23/2009)   Triglycerides: 149  (03/23/2009)    SGOT (AST): 12  (03/23/2009)   SGPT (ALT): 13  (03/23/2009)   Alkaline phosphatase: 75  (03/23/2009)   Total bilirubin: 0.2  (03/23/2009)    Lipid flowsheet reviewed?: Yes   Progress toward LDL goal: At goal  Hypertension   Last Blood Pressure: 129 / 86  (05/03/2009)   Serum creatinine: 1.01  (03/23/2009)   Serum potassium 4.4  (03/23/2009)    Hypertension flowsheet reviewed?: Yes   Progress  toward BP goal: At goal  Self-Management Support :   Personal Goals (by the next clinic visit) :      Personal blood pressure goal: 140/90  (05/03/2009)     Personal LDL goal: 100  (05/03/2009)    Patient will work on the following items until the next clinic visit to reach self-care goals:     Medications and monitoring: take my medicines every day, bring all of my medications to every visit  (05/03/2009)     Eating: drink diet soda or water instead of juice or soda, eat more vegetables, use fresh or frozen vegetables, eat foods that are low in salt, eat baked foods instead of fried foods, eat fruit for snacks and desserts, limit or avoid alcohol  (05/03/2009)     Activity: take a 30 minute walk every day  (11/06/2008)    Hypertension self-management support: Resources for patients handout  (05/03/2009)    Lipid self-management support: Resources for patients handout  (05/03/2009)     Self-management comments: PATIENT UNABLE TO EXCERSICE DUE TO CHRONIC RIGHT KNEE PAIN  Resource handout printed.

## 2010-03-19 NOTE — Letter (Signed)
Summary: [facility]   Imported By: Garlan Fillers 05/23/2009 09:28:27  _____________________________________________________________________  External Attachment:    Type:   Image     Comment:   External Document

## 2010-03-19 NOTE — Progress Notes (Signed)
Summary: refill/gg  Phone Note Refill Request  on October 05, 2009 4:35 PM  Refills Requested: Medication #1:  VICODIN ES 7.5-750 MG TABS Take one tab every 6-8 hours as needed for pain   Last Refilled: 08/22/2009  Method Requested: Fax to [facility] Initial call taken by: Gevena Cotton RN,  October 05, 2009 4:36 PM  Follow-up for Phone Call        No narc contract but getting #120 monthly. Last appt was 05/17/09 and no  F/U given. Appears narc is for OA type pain. Ran [place] narc database and no other MD's RXing narcs. Will refill. Has appt on the 26th. Follow-up by: Larey Dresser MD,  October 05, 2009 5:12 PM  Additional Follow-up for Phone Call Additional follow up Details #1::        Rx faxed to pharmacy Additional Follow-up by: Gevena Cotton RN,  October 08, 2009 3:35 PM    Prescriptions: VICODIN ES 7.5-750 MG TABS (HYDROCODONE-ACETAMINOPHEN) Take one tab every 6-8 hours as needed for pain  #120 x 0   Entered and Authorized by:   Larey Dresser MD   Signed by:   Larey Dresser MD on 10/05/2009   Method used:   Telephoned to ...       [facility] [phone]* (retail)       [address]       [place], [place]  [address]       Ph: [phone]       Fax: [phone]   RxID:   [id number]

## 2010-03-19 NOTE — Progress Notes (Signed)
Summary: med refill/gp  Phone Note Refill Request Message from:  Fax from Pharmacy on November 06, 2009 12:12 PM  Refills Requested: Medication #1:  VICODIN ES 7.5-750 MG TABS Take one tab every 6-8 hours as needed for pain   Last Refilled: 10/08/2009 Last appt. 10/12/09.   Method Requested: Telephone to Pharmacy Initial call taken by: Morrison Old RN,  November 06, 2009 12:11 PM  Follow-up for Phone Call        may be filled on 9/22!!!  Follow-up by: Burman Freestone MD,  November 06, 2009 2:42 PM  Additional Follow-up for Phone Call Additional follow up Details #1::        Rx called to pharmacy - [facility]. Additional Follow-up by: Morrison Old RN,  November 12, 2009 10:57 AM    Prescriptions: VICODIN ES 7.5-750 MG TABS (HYDROCODONE-ACETAMINOPHEN) Take one tab every 6-8 hours as needed for pain  #120 x 0   Entered and Authorized by:   Burman Freestone MD   Signed by:   Burman Freestone MD on 11/12/2009   Method used:   Telephoned to ...       [facility] [phone]* (retail)       [address]       [place], [place]  [address]       Ph: [phone]       Fax: [phone]   RxID:   [id number]

## 2010-03-21 NOTE — Progress Notes (Signed)
Summary: refill/ hla  Phone Note Refill Request Message from:  Fax from Pharmacy on January 29, 2010 11:12 AM  Refills Requested: Medication #1:  VICODIN ES 7.5-750 MG TABS Take one tab every 6-8 hours as needed for pain   Dosage confirmed as above?Dosage Confirmed   Last Refilled: 11/14 Initial call taken by: Freddy Finner RN,  January 29, 2010 11:12 AM  Follow-up for Phone Call        Rx denied because patient missed appointment with me 12/18/2009. She was last seen 09/2009. She will need to be seen in clinic before I can refill Vicodin.  Follow-up by: Epimenio Sarin MD,  January 31, 2010 9:13 AM  Additional Follow-up for Phone Call Additional follow up Details #1::        Pt. had called and was made awared of Dr, Harlow Mares' response.  She re-scheduled her appt. for Jan 3. Additional Follow-up by: Morrison Old RN,  February 01, 2010 10:02 AM

## 2010-03-21 NOTE — Progress Notes (Signed)
Summary: refill/gg  Phone Note Refill Request  on January 22, 2010 5:24 PM  Refills Requested: Medication #1:  SEROQUEL 300 MG TABS Take 3 tablets by mouth at night   Last Refilled: 10/31/2009  Medication #2:  ABILIFY 2 MG TABS   Last Refilled: 10/15/2009  Medication #3:  AMBIEN 10 MG TABS   Last Refilled: 10/31/2009  Method Requested: Fax to [facility] Initial call taken by: Gevena Cotton RN,  January 22, 2010 5:24 PM  Follow-up for Phone Call        Need to be filled. Follow-up by: Burman Freestone MD,  January 23, 2010 9:41 AM  Additional Follow-up for Phone Call Additional follow up Details #1::        THese need to be filled by the pts mental health [facility].  Additional Follow-up by: Larey Dresser MD,  January 23, 2010 2:03 PM

## 2010-03-21 NOTE — Progress Notes (Signed)
Summary: refill/gg  Phone Note Refill Request  on January 22, 2010 5:20 PM  Refills Requested: Medication #1:  TRAZODONE HCL 100 MG TABS   Last Refilled: 10/31/2009  Medication #2:  PAROXETINE HCL 20 MG  TABS Take one tablet by mouth daily   Last Refilled: 10/21/2009  Medication #3:  WELLBUTRIN SR 150 MG TB12 Take 3 tablets by mouth once a day   Last Refilled: 10/21/2009  Medication #4:  LORAZEPAM 1 MG TABS Take 1 tablet by mouth three times a day as needed   Last Refilled: 10/21/2009 Can pt be on all these meds?   Method Requested: Fax to [facility] Initial call taken by: Gevena Cotton RN,  January 22, 2010 5:21 PM  Follow-up for Phone Call        These 7 psychotropic drugs needs to be refilled by her mental health provider. Follow-up by: Burman Freestone MD,  January 23, 2010 9:41 AM

## 2010-03-21 NOTE — Assessment & Plan Note (Addendum)
Summary: EST-MEDICATION REFILLS/CFB   Vital Signs:  Patient profile:   55 year old female Height:      67 inches (170.18 cm) Weight:      343.2 pounds (156.00 kg) BMI:     53.95 Temp:     97.7 degrees F (36.50 degrees C) oral Pulse rate:   110 / minute BP sitting:   140 / 82  (left arm)  Vitals Entered By: Hilda Blades Ditzler RN (February 19, 2010 3:06 PM) Is Patient Diabetic? No Pain Assessment Patient in pain? yes     Location: left kneee Intensity: 10 Type: sharp Onset of pain  surgery 12/2009 Nutritional Status BMI of > 30 = obese Nutritional Status Detail appetite good  Have you ever been in a relationship where you felt threatened, hurt or afraid?denies   Does patient need assistance? Functional Status Self care Ambulation Impaired:Risk for fall Comments Uses a cane and daughter with pt. Refills on meds and discuss pain med.   Primary Care Provider:  Epimenio Sarin MD   History of Present Illness: 55yo W with morbid obesity, HTN, HL, depression, and chronic knee pain s/p bilateral total knee replacement who presents for further evaluation of her pain. Total replacement of L knee was performed by Dr Sharol Given in 10/2009. She says that her pain has been worse since the surgery and that Dr. Sharol Given has indicated that she is not healing well following the surgery. She says that Dr. Sharol Given has prescribed gabapentin for pain but no opiates; she uses the narcotics prescribed by our clinic to manage the ongoing pain.  Depression History:      The patient denies a depressed mood most of the day and a diminished interest in her usual daily activities.         Preventive Screening-Counseling & Management  Alcohol-Tobacco     Alcohol drinks/day: 0     Smoking Status: quit     Year Quit: 1994     Passive Smoke Exposure: no  Caffeine-Diet-Exercise     Does Patient Exercise: yes     Type of exercise: WALKING     Times/week:   3  Current Medications (verified): 1)  Synthroid 175 Mcg Tabs  (Levothyroxine Sodium) .... Take 1 Tablet By Mouth Once A Day 2)  Paroxetine Hcl 20 Mg  Tabs (Paroxetine Hcl) .... Take One Tablet By Mouth Daily 3)  Wellbutrin Sr 150 Mg Tb12 (Bupropion Hcl) .... Take 3 Tablets By Mouth Once A Day 4)  Seroquel 300 Mg Tabs (Quetiapine Fumarate) .... Take 3 Tablets By Mouth At Night 5)  Valtrex 1 Gm Tabs (Valacyclovir Hcl) .... Take 1 Tablet By Mouth Once A Day 6)  Norvasc 10 Mg Tabs (Amlodipine Besylate) .... Take One Tablet Daily To Reduce Blood Pressure. 7)  Nexium 40 Mg  Cpdr (Esomeprazole Magnesium) .... Take 1 Tablet By Mouth Once A Day 8)  Mobic 15 Mg Tabs (Meloxicam) .... Take 1 Tablet By Mouth Once A Day 9)  Lorazepam 1 Mg Tabs (Lorazepam) .... Take 1 Tablet By Mouth Three Times A Day As Needed 10)  Vicodin Es 7.5-750 Mg Tabs (Hydrocodone-Acetaminophen) .... Take One Tab Every 6-8 Hours As Needed For Pain 11)  Lasix 40 Mg Tabs (Furosemide) .... Take 1 Tablet By Mouth Once A Day 12)  Pravastatin Sodium 80 Mg Tabs (Pravastatin Sodium) .... Take One Tablet At Bedtime. 13)  Lisinopril 20 Mg Tabs (Lisinopril) .... Take 1 Tablet By Mouth Once A Day 14)  Lactulose  Soln (Lactulose) .Marland KitchenMarland KitchenMarland Kitchen  Take 58m Three Times A Day, If You Developed Diarrhea, Cut To Twice Daily. 15)  Flexeril 10 Mg Tabs (Cyclobenzaprine Hcl) 16)  Ambien 10 Mg Tabs (Zolpidem Tartrate) 17)  Trazodone Hcl 100 Mg Tabs (Trazodone Hcl) 18)  Abilify 2 Mg Tabs (Aripiprazole) 19)  Ventolin Hfa 108 (90 Base) Mcg/act Aers (Albuterol Sulfate) .... 2 Puffs Q 6 Hours As Needed For Shortness of Breath 20)  Nasonex 50 Mcg/act Susp (Mometasone Furoate) .... 2 Sprays in Each Nostril Daily 21)  Tessalon Perles 100 Mg Caps (Benzonatate) .... Take One Cap As Needed For Cough 4-6 Times A Day 22)  Cheratussin Ac 100-10 Mg/554mSyrp (Guaifenesin-Codeine) .... Take 1-2 Tsp Every 4-6 Hours As Needed For Cough. 23)  Symbicort 80-4.5 Mcg/act Aero (Budesonide-Formoterol Fumarate) .... One Puff Bid 24)  Gabapentin 300 Mg  Caps (Gabapentin) .... Take 1 Tablet By Mouth Three Times A Day  Allergies: 1)  ! Morphine 2)  ! * Oxycontin  Past History:  Past Medical History: Last updated: 04/04/2008 Depression GERD Hypertension Hypothyroidism Pancreatitis, hx of Knee Pain/foot pain Obesity Breast Lump s/p right knee replacement 5/09, with revision 06/09 s/p right foot surgery 2007  Family History: Last updated: 02/07/2008 Family History of CAD Female 1st degree relative <50 Brother with MI at 3526per patient no drug use)  Social History: Last updated: 02/07/2008 Occupation: disabled waBiochemist, clinicalingle Quit smoking 1994 (10pyhx) Alcohol use-no Drug use-no Regular exercise-no  Past Surgical History: Lumpectomy Right ankle reconstructive procedure Total knee replacement-right Total knee replacement -left  Review of Systems      See HPI General:  Complains of weakness; denies chills and fever. CV:  Denies chest pain or discomfort and swelling of feet. Resp:  Denies cough and shortness of breath. GI:  Denies abdominal pain. MS:  Complains of joint pain; denies joint redness and joint swelling. Neuro:  Denies numbness and tingling. Psych:  Denies depression.  Physical Exam  General:  alert, cooperative to examination, and overweight-appearing.   Head:  normocephalic and atraumatic.   Eyes:  vision grossly intact, pupils equal, pupils round, and pupils reactive to light.   Mouth:  pharynx pink and moist.   Lungs:  normal respiratory effort, normal breath sounds, no crackles, and no wheezes.   Heart:  normal rate, regular rhythm, no murmur, no gallop, and no rub.   Abdomen:  soft, non-tender, and normal bowel sounds.   Msk:  no joint tenderness, no joint swelling, no joint warmth, and no redness over joints.   Extremities:  No edema.  Neurologic:  alert & oriented X3 and cranial nerves grossly intact. Intact strength/sensation in extremities.  Skin:  turgor normal and no rashes.     Psych:  Oriented X3 and memory intact for recent and remote.     Impression & Recommendations:  Problem # 1:  KNEE PAIN (I([id number] describes ongoing L knee pain, even worse than prior to total knee replacement surgery and states that the surgeon says it is healing poorly. She follows with her orthopedist regularly who is prescribing gabapentin for pain. Requested records from Dr. DuSharol Givenmost recent clinic note documents good range of motion in recent follow-up and emphasize need for continued PT and strength training in that knee (note does not mention pain control). I am worried about her ongoing need for narcotics (she currently uses 4 vicodin per day). Will prescribe enough vicodin today to treat her pain for the next couple of weeks while I confirm the pain regimen that she's  getting from her orthopedist. If she is getting no other narcotics, will refill enough to get to her next appt in one month. At that appointment, if her pain is not improving, will need to address chronic pain management (longer acting narcotic? pain contract?).   Her updated medication list for this problem includes:    Mobic 15 Mg Tabs (Meloxicam) .[name]... Take 1 tablet by mouth once a day    Vicodin Es 7.5-750 Mg Tabs (Hydrocodone-acetaminophen) .[name]... Take one tab every 6-8 hours as needed for pain    Flexeril 10 Mg Tabs (Cyclobenzaprine hcl)  Problem # 2:  OBESITY (ICD-278.00) Discussed morbid obesity, which continues to worsen (she has gained nearly 20 lbs since 09/2009). Emphasized that her weight is responsible for her ongoing joint pain and mobility problems and puts her at substantial risk for other health problems. Offered to refer her to a nutritionist but she declined.   Problem # 3:  DEPRESSION (ICD-311) Managed by behavioral health. She is definitely taking at least seroquel, abilify, lorazepam, and trazodone -- not sure about the rest of the med list. Am concerned about her long list of medications.  Will continue to address at next visit.   Her updated medication list for this problem includes:    Paroxetine Hcl 20 Mg Tabs (Paroxetine hcl) .[name]... Take one tablet by mouth daily    Wellbutrin Sr 150 Mg Tb12 (Bupropion hcl) .[name]... Take 3 tablets by mouth once a day    Lorazepam 1 Mg Tabs (Lorazepam) .[name]... Take 1 tablet by mouth three times a day as needed    Trazodone Hcl 100 Mg Tabs (Trazodone hcl)  Problem # 4:  HYPOTHYROIDISM (ICD-244.9) Will check TSH today.   Her updated medication list for this problem includes:    Synthroid 175 Mcg Tabs (Levothyroxine sodium) .[name]... Take 1 tablet by mouth once a day  Orders: T-TSH ([id number])  Problem # 5:  HYPERTENSION (ICD-401.9) BP slightly higher today. Will continue to monitor. Will also check CMET, CBC today.   Her updated medication list for this problem includes:    Norvasc 10 Mg Tabs (Amlodipine besylate) .[name]... Take one tablet daily to reduce blood pressure.    Lasix 40 Mg Tabs (Furosemide) .[name]... Take 1 tablet by mouth once a day    Lisinopril 20 Mg Tabs (Lisinopril) .[name]... Take 1 tablet by mouth once a day  Orders: T-CBC No Diff ([id number]) T-Comprehensive Metabolic Panel ([id number])  Complete Medication List: 1)  Synthroid 175 Mcg Tabs (Levothyroxine sodium) .... Take 1 tablet by mouth once a day 2)  Paroxetine Hcl 20 Mg Tabs (Paroxetine hcl) .... Take one tablet by mouth daily 3)  Wellbutrin Sr 150 Mg Tb12 (Bupropion hcl) .... Take 3 tablets by mouth once a day 4)  Seroquel 300 Mg Tabs (Quetiapine fumarate) .... Take 3 tablets by mouth at night 5)  Valtrex 1 Gm Tabs (Valacyclovir hcl) .... Take 1 tablet by mouth once a day 6)  Norvasc 10 Mg Tabs (Amlodipine besylate) .... Take one tablet daily to reduce blood pressure. 7)  Nexium 40 Mg Cpdr (Esomeprazole magnesium) .... Take 1 tablet by mouth once a day 8)  Mobic 15 Mg Tabs (Meloxicam) .... Take 1 tablet by mouth once a day 9)  Lorazepam 1 Mg Tabs (Lorazepam) .... Take 1  tablet by mouth three times a day as needed 10)  Vicodin Es 7.5-750 Mg Tabs (Hydrocodone-acetaminophen) .... Take one tab every 6-8 hours as needed for pain 11)  Lasix 40 Mg Tabs (  Furosemide) .... Take 1 tablet by mouth once a day 12)  Pravastatin Sodium 80 Mg Tabs (Pravastatin sodium) .... Take one tablet at bedtime. 13)  Lisinopril 20 Mg Tabs (Lisinopril) .... Take 1 tablet by mouth once a day 14)  Lactulose Soln (Lactulose) .... Take 26m three times a day, if you developed diarrhea, cut to twice daily. 15)  Flexeril 10 Mg Tabs (Cyclobenzaprine hcl) 16)  Ambien 10 Mg Tabs (Zolpidem tartrate) 17)  Trazodone Hcl 100 Mg Tabs (Trazodone hcl) 18)  Abilify 2 Mg Tabs (Aripiprazole) 19)  Ventolin Hfa 108 (90 Base) Mcg/act Aers (Albuterol sulfate) .... 2 puffs q 6 hours as needed for shortness of breath 20)  Nasonex 50 Mcg/act Susp (Mometasone furoate) .... 2 sprays in each nostril daily 21)  Tessalon Perles 100 Mg Caps (Benzonatate) .... Take one cap as needed for cough 4-6 times a day 22)  Cheratussin Ac 100-10 Mg/590mSyrp (Guaifenesin-codeine) .... Take 1-2 tsp every 4-6 hours as needed for cough. 23)  Symbicort 80-4.5 Mcg/act Aero (Budesonide-formoterol fumarate) .... One puff bid 24)  Gabapentin 300 Mg Caps (Gabapentin) .... Take 1 tablet by mouth three times a day  Patient Instructions: 1)  Please schedule a follow-up appointment in 1 months. 2)  We will request records from your orthopedic doctor so we can better coordinate your care.  Prescriptions: VICODIN ES 7.5-750 MG TABS (HYDROCODONE-ACETAMINOPHEN) Take one tab every 6-8 hours as needed for pain  #60 x 0   Entered and Authorized by:   EdEpimenio SarinD   Signed by:   EdEpimenio SarinD on 02/19/2010   Method used:   Reprint   RxID: :   [id number] ES 7.5-750 MG TABS (HYDROCODONE-ACETAMINOPHEN) Take one tab every 6-8 hours as needed for pain  #60 x 0   Entered and Authorized by:   EdEpimenio SarinD   Signed by:   EdEpimenio SarinD  on 02/19/2010   Method used:   Print then Give to Patient   RxID:   [id number] HFA 108 (90 BASE) MCG/ACT AERS (ALBUTEROL SULFATE) 2 puffs q 6 hours as needed for shortness of breath  #1 x 11   Entered and Authorized by:   EdEpimenio SarinD   Signed by:   EdEpimenio SarinD on 02/19/2010   Method used:   Electronically to        [phone](retail)       [place]  [address]     Ph: [phone]     Fax: [phone] RxID: :   [id number]) take 4548mhree times a day, if you developed diarrhea, cut to twice daily.  #1 bottle x 3   Entered and Authorized by:   EdiEpimenio Sarin   Signed by:   EdiEpimenio Sarin on 02/19/2010   Method used:   [facility]ron[facility]        [phone]       [address]  [id number]    Ph: [phone]    Fax: [phone]:  :   [id number] 20 MG TABS (LISINOPRIL) Take 1 tablet by mouth once a day  #32 x 11   Entered and Authorized by:   EdiEpimenio Sarin   Signed by:   EdiEpimenio Sarin on 02/19/2010  Method used:   Electronically to        [facility] [phone]* (retail)       [place], [place]  [address]       Ph: [phone]       Fax: [phone]   RxID:   [id number] PRAVASTATIN SODIUM 80 MG TABS (PRAVASTATIN SODIUM) Take one tablet at bedtime.  #32 x 6   Entered and Authorized by:   Epimenio Sarin MD   Signed by:   Epimenio Sarin MD on 02/19/2010   Method used:   Electronically to        [facility] [phone]* (retail)       [address]       [place], [place]  [address]       Ph: [phone]       Fax: [phone]   RxID:   [id number] LASIX 40 MG TABS (FUROSEMIDE) Take 1 tablet by mouth once a day  #31 x 2   Entered and Authorized by:   Epimenio Sarin MD   Signed by:   Epimenio Sarin MD on 02/19/2010   Method used:   Electronically to        [facility] [phone]* (retail)       [address]       [place], [place]  [address]       Ph: [phone]       Fax: [phone]   RxID:   [id number] MOBIC 15 MG TABS (MELOXICAM) Take 1 tablet by mouth once a day  #30 x 5   Entered and Authorized by:   Epimenio Sarin MD   Signed by:   Epimenio Sarin MD on 02/19/2010   Method used:   Electronically to        [facility] [phone]* (retail)       [place], [place]  [address]       Ph: [phone]       Fax: [phone]   RxID:   [id number] NEXIUM 40 MG  CPDR (ESOMEPRAZOLE MAGNESIUM) Take 1 tablet by mouth once a day  #30 Each x 5   Entered and Authorized by:   Epimenio Sarin MD   Signed by:   Epimenio Sarin MD on 02/19/2010   Method used:   Electronically to        [facility] [phone]* (retail)       [place], [place]  [address]       Ph: [phone]       Fax: [phone]   RxID:   [id number] NORVASC 10 MG TABS (AMLODIPINE BESYLATE) Take one tablet daily to reduce blood pressure.  #32 x 2   Entered and Authorized by:   Epimenio Sarin MD   Signed by:   Epimenio Sarin MD on 02/19/2010   Method used:   Electronically to        [facility] [phone]* (retail)       [place], [place]  [address]       Ph: [phone]       Fax: [phone]   RxID:   [id number] VALTREX 1 GM TABS (VALACYCLOVIR HCL) Take 1 tablet by mouth once a day  #30 x 3   Entered and Authorized by:   Epimenio Sarin MD  Signed by:   Epimenio Sarin MD on 02/19/2010   Method used:   Electronically to        [facility] [phone]* (retail)       [address]       [place], [place]  [address]       Ph: [phone]       Fax: [phone]   RxID:   [id number] SYNTHROID 175 MCG TABS (LEVOTHYROXINE SODIUM) Take 1 tablet by mouth once a day  #31 x 6   Entered and Authorized by:   Epimenio Sarin MD   Signed by:   Epimenio Sarin MD on 02/19/2010   Method used:   Electronically to         [facility] [phone]* (retail)       [address]       [place], [place]  [address]       Ph: [phone]       Fax: [phone]   RxID:   [id number]    Orders Added: 1)  T-CBC No Diff [[id number]] 2)  T-Comprehensive Metabolic Panel [[id number]] 3)  T-TSH [[id number]] 4)  Est. Patient Level IV [12197]   Process Orders Check Orders Results:     [facility]: Check successful Tests Sent for requisitioning (February 21, 2010 7:55 PM):     02/19/2010: [facility] -- T-CBC No Diff [[id number]] (signed)     02/19/2010: [facility] -- T-Comprehensive Metabolic Panel [[id number]] (signed)     02/19/2010: [facility] -- T-TSH [phone] (signed)     Prevention & Chronic Care Immunizations   Influenza vaccine: Fluvax MCR  (10/12/2009)    Tetanus booster: Not documented   Td booster deferral: Not indicated  (10/12/2009)   Tetanus booster due: 09/17/2017    Pneumococcal vaccine: Pneumovax  (10/12/2009)   Pneumococcal vaccine deferral: Not indicated  (05/03/2009)   Pneumococcal vaccine due: 05/10/2021  Colorectal Screening   Hemoccult: Not documented    Colonoscopy: Not documented   Colonoscopy action/deferral: GI referral  (10/12/2009)  Other Screening   Pap smear: Not documented   Pap smear action/deferral: Not indicated S/P hysterectomy  (08/28/2008)    Mammogram: No specific mammographic evidence of malignancy.  Assessment: BIRADS 1.   (09/07/2008)   Mammogram action/deferral: Ordered  (10/12/2009)   Smoking status: quit  (02/19/2010)  Lipids   Total Cholesterol: 147  (03/23/2009)   Lipid panel action/deferral: Lipid Panel ordered   LDL: 58  (03/23/2009)   LDL Direct: Not documented   HDL: 59  (03/23/2009)   Triglycerides: 149  (03/23/2009)   Lipid panel due: 04/20/2009    SGOT (AST): 12  (03/23/2009)   SGPT (ALT): 13  (03/23/2009) CMP ordered    Alkaline phosphatase: 75   (03/23/2009)   Total bilirubin: 0.2  (03/23/2009)   Liver panel due: 03/23/2010    Lipid flowsheet reviewed?: Yes   Progress toward LDL goal: Unchanged  Hypertension   Last Blood Pressure: 140 / 82  (02/19/2010)   Serum creatinine: 1.01  (03/23/2009)   Serum potassium 4.4  (03/23/2009) CMP ordered    Basic metabolic panel due: 15/94/5859    Hypertension flowsheet reviewed?: Yes   Progress toward BP goal: Deteriorated  Self-Management Support :   Personal Goals (by the next clinic visit) :      Personal blood pressure goal: 140/90  (05/17/2009)     Personal LDL goal: 100  (05/03/2009)    Patient will work on the following items until  the next clinic visit to reach self-care goals:     Medications and monitoring: take my medicines every day, check my blood pressure, bring all of my medications to every visit, weigh myself weekly  (02/19/2010)     Eating: drink diet soda or water instead of juice or soda, eat more vegetables, use fresh or frozen vegetables, eat foods that are low in salt, eat fruit for snacks and desserts, limit or avoid alcohol  (02/19/2010)     Activity: take a 30 minute walk every day  (11/06/2008)    Hypertension self-management support: Written self-care plan, Education handout, Resources for patients handout  (02/19/2010)   Hypertension self-care plan printed.   Hypertension education handout printed    Lipid self-management support: Written self-care plan, Education handout, Resources for patients handout  (02/19/2010)   Lipid self-care plan printed.   Lipid education handout printed      Resource handout printed.   Process Orders Check Orders Results:     [facility]: Check successful Tests Sent for requisitioning (February 21, 2010 7:55 PM):     02/19/2010: [facility] -- T-CBC No Diff [[id number]] (signed)     02/19/2010: [facility] -- T-Comprehensive Metabolic Panel [[id number]] (signed)      02/19/2010: [facility] -- T-TSH [phone] (signed)      Appended Document: vicodin refill Checked ortho records and called pharmacy to confirm that patient only receiving opiate pain meds from our clinic. Called patient -- continues to have significant knee pain s/p total knee replacement. Refilled vicodin 120 tablets by telephone. Patient has appointment to see me next month, at which time we will further address pain management.   Prescriptions: Prescriptions: VICODIN ES 7.5-750 MG TABS (HYDROCODONE-ACETAMINOPHEN) Take one tab every 6-8 hours as needed for pain  #120 x 0   Entered and Authorized by:   Epimenio Sarin MD   Signed by:   Epimenio Sarin MD on 03/10/2010   Method used:   Telephoned to ...       [facility] [phone]* (retail)       [address]       [place], [place]  [address]       Ph: [phone]       Fax: [phone]   RxID:   [id number]

## 2010-03-21 NOTE — Progress Notes (Signed)
Summary: med refill/gp  Phone Note Refill Request Message from:  Fax from Pharmacy on February 07, 2010 11:42 AM  Refills Requested: Medication #1:  VICODIN ES 7.5-750 MG TABS Take one tab every 6-8 hours as needed for pain   Last Refilled: 01/01/2010 Last appt. 11/1; next appt. 02/19/10.   Method Requested: Telephone to Pharmacy Initial call taken by: Morrison Old RN,  February 07, 2010 11:43 AM  Follow-up for Phone Call        See Dr. Harlow Mares note regarding need for follow-up.  I will approve enough to cover until the scheduled appointment on January 3.  Please advise patient to keep that appointment. Follow-up by: Bertha Stakes MD,  February 07, 2010 11:57 AM  Additional Follow-up for Phone Call Additional follow up Details #1::        Rx called to [facility]; pt. was called and made  awared. Additional Follow-up by: Morrison Old RN,  February 07, 2010 12:37 PM    Prescriptions: VICODIN ES 7.5-750 MG TABS (HYDROCODONE-ACETAMINOPHEN) Take one tab every 6-8 hours as needed for pain  #48 x 0   Entered and Authorized by:   Bertha Stakes MD   Signed by:   Bertha Stakes MD on 02/07/2010   Method used:   Telephoned to ...       [facility] [phone]* (retail)       [address]       [place], [place]  [address]       Ph: [phone]       Fax: [phone]   RxID:   [id number]

## 2010-03-22 ENCOUNTER — Encounter: Payer: Self-pay | Admitting: Internal Medicine

## 2010-03-22 ENCOUNTER — Ambulatory Visit: Admit: 2010-03-22 | Payer: Self-pay

## 2010-03-22 ENCOUNTER — Ambulatory Visit (INDEPENDENT_AMBULATORY_CARE_PROVIDER_SITE_OTHER): Payer: Medicare Other | Admitting: Internal Medicine

## 2010-03-22 DIAGNOSIS — E669 Obesity, unspecified: Secondary | ICD-10-CM

## 2010-03-22 DIAGNOSIS — M25562 Pain in left knee: Secondary | ICD-10-CM

## 2010-03-22 DIAGNOSIS — M25569 Pain in unspecified knee: Secondary | ICD-10-CM

## 2010-03-22 MED ORDER — MORPHINE SULFATE CR 15 MG PO TB12: 15.0000 mg | ORAL_TABLET | Freq: Two times a day (BID) | ORAL | Status: DC

## 2010-03-22 NOTE — Patient Instructions (Signed)
Please schedule a follow-up appointment in 3-5 months.  I am prescribing a new pain medication today (MS Contin). Take one tablet twice daily. If you have any problems taking this medication or experience side effects, please call our office. We may need you to bring the un-used pills back to the clinic and we will give you a prescription for a different pain medication.

## 2010-03-24 ENCOUNTER — Encounter: Payer: Self-pay | Admitting: Internal Medicine

## 2010-03-24 NOTE — Progress Notes (Signed)
Subjective:    Patient ID: [name], female    DOB: 1955-09-08, 55 y.o.   MRN: [id number]  Knee Pain  Pertinent negatives include no numbness.   55yo W with obesity, HTN, HL s/p bilateral total knee replacements who presents for follow-up of persistent knee pain. Patient underwent replacement of L knee approximately 5 months ago (replacement of R knee 05/09). She underwent home physical therapy; she continues to do the exercises 2-3 times/day at home, although last PT visit was in December. She uses Vicodin for pain as well as meloxicam and gabapentin. She says that the Vicodin helps with pain but she often has to use 2 tablets instead of 1 for pain relief; the pain relief also wears off after about 4 hours and she needs to take more. She ends up taking approximately 4-5 Vicodin tablets per day. She is frustrated by her ongoing pain and need to take so many pills throughout the day. In addition to continuing her PT exercises, she reports watching her diet in order to lose weight (she has lost ~16 pounds since her visit last month) as she know this should help with her joint pain.   Review of Systems  Constitutional: Negative for fever, chills and appetite change.  HENT: Negative for congestion and rhinorrhea.   Respiratory: Negative for shortness of breath.   Cardiovascular: Negative for chest pain.  Gastrointestinal: Negative for nausea, vomiting, abdominal pain and constipation.  Genitourinary: Negative for dysuria.  Musculoskeletal: Positive for back pain.  Skin: Negative for rash.  Neurological: Negative for numbness.  Psychiatric/Behavioral: Negative for suicidal ideas and dysphoric mood. The patient is not nervous/anxious.        Objective:   Physical Exam  Constitutional: She is oriented to person, place, and time.  HENT:  Head: Normocephalic and atraumatic.  Mouth/Throat: Oropharynx is clear and moist.  Eyes: EOM are normal. Pupils are equal, round, and reactive to light.   Neck: Neck supple.  Cardiovascular: Normal rate, regular rhythm and normal heart sounds.  Exam reveals no gallop and no friction rub.   No murmur heard. Pulmonary/Chest: Effort normal and breath sounds normal. She has no wheezes. She has no rales.  Abdominal: Soft. Bowel sounds are normal. She exhibits no distension. There is no tenderness. There is no rebound and no guarding.  Musculoskeletal:       Tenderness to palpation of anterior L knee.   Neurological: She is alert and oriented to person, place, and time. No cranial nerve deficit. Coordination normal.  Skin: Skin is warm and dry. No rash noted.  Psychiatric: She has a normal mood and affect. Her behavior is normal.       Assessment & Plan:  Given concern about the quantity of Tylenol patient is consuming in order to achieve pain relief with Tylenol, will switch to MS Contin. This is also longer-acting and patient will be able to take fewer pills. Patient has documented reaction to morphine (makes her feel "out of it"). She was prescribed MS Contin back in 2009 and took it for approximately 2 months; she does not remember it well but thinks that it did make her feel "spaced out." However, she notes that she thinks she may have been taking it more than the prescribed twice daily. Patient is willing to try MS Contin again for pain relief. She is instructed to take it only twice daily and to call the clinic if she has any problem with the medication. If she is not happy with  MS Contin or experiences adverse effects, she will bring the remaining pills into the clinic and an alternative pain medication will be prescribed. She signed a pain contract and agreed that she will not receive opiate pain medications from any other sources. Weight loss efforts and PT exercises were reinforced.

## 2010-04-22 ENCOUNTER — Other Ambulatory Visit: Payer: Self-pay | Admitting: *Deleted

## 2010-04-22 MED ORDER — MORPHINE SULFATE CR 15 MG PO TB12: 15.0000 mg | ORAL_TABLET | Freq: Two times a day (BID) | ORAL | Status: DC

## 2010-04-22 NOTE — Telephone Encounter (Signed)
Will hand script to refill nurse for her to transfer to patient.

## 2010-05-01 ENCOUNTER — Other Ambulatory Visit: Payer: Self-pay | Admitting: Internal Medicine

## 2010-05-02 LAB — BASIC METABOLIC PANEL
BUN: 13 mg/dL (ref 6–23)
BUN: 16 mg/dL (ref 6–23)
BUN: 9 mg/dL (ref 6–23)
CO2: 26 mEq/L (ref 19–32)
Calcium: 8.6 mg/dL (ref 8.4–10.5)
Calcium: 9.1 mg/dL (ref 8.4–10.5)
Chloride: 102 mEq/L (ref 96–112)
Creatinine, Ser: 0.82 mg/dL (ref 0.4–1.2)
Creatinine, Ser: 1.83 mg/dL — ABNORMAL HIGH (ref 0.4–1.2)
GFR calc Af Amer: 35 mL/min — ABNORMAL LOW (ref >60)
GFR calc non Af Amer: 50 mL/min — ABNORMAL LOW (ref >60)
GFR calc non Af Amer: >60 mL/min (ref >60)
Glucose, Bld: 130 mg/dL — ABNORMAL HIGH (ref 70–99)
Glucose, Bld: 148 mg/dL — ABNORMAL HIGH (ref 70–99)
Glucose, Bld: 173 mg/dL — ABNORMAL HIGH (ref 70–99)

## 2010-05-02 LAB — COMPREHENSIVE METABOLIC PANEL
ALT: 18 U/L (ref 0–35)
CO2: 27 mEq/L (ref 19–32)
Calcium: 9.6 mg/dL (ref 8.4–10.5)
Creatinine, Ser: 1.17 mg/dL (ref 0.4–1.2)
GFR calc non Af Amer: 48 mL/min — ABNORMAL LOW (ref >60)
Glucose, Bld: 104 mg/dL — ABNORMAL HIGH (ref 70–99)

## 2010-05-02 LAB — CBC
HCT: 27.2 % — ABNORMAL LOW (ref 36.0–46.0)
HCT: 36.5 % (ref 36.0–46.0)
Hemoglobin: 12 g/dL (ref 12.0–15.0)
MCH: 27.4 pg (ref 26.0–34.0)
MCH: 27.4 pg (ref 26.0–34.0)
MCH: 28.2 pg (ref 26.0–34.0)
MCH: 28.2 pg (ref 26.0–34.0)
MCHC: 31.7 g/dL (ref 30.0–36.0)
MCHC: 32.1 g/dL (ref 30.0–36.0)
MCHC: 32.4 g/dL (ref 30.0–36.0)
MCHC: 32.9 g/dL (ref 30.0–36.0)
MCHC: 32.9 g/dL (ref 30.0–36.0)
MCHC: 33.2 g/dL (ref 30.0–36.0)
MCV: 84.7 fL (ref 78.0–100.0)
MCV: 85.5 fL (ref 78.0–100.0)
MCV: 85.7 fL (ref 78.0–100.0)
MCV: 85.8 fL (ref 78.0–100.0)
Platelets: 175 10*3/uL (ref 150–400)
Platelets: 194 10*3/uL (ref 150–400)
Platelets: 214 10*3/uL (ref 150–400)
Platelets: 224 10*3/uL (ref 150–400)
RDW: 15.4 % (ref 11.5–15.5)
RDW: 15.5 % (ref 11.5–15.5)
RDW: 15.5 % (ref 11.5–15.5)
RDW: 15.5 % (ref 11.5–15.5)
RDW: 15.5 % (ref 11.5–15.5)
WBC: 10.3 10*3/uL (ref 4.0–10.5)
WBC: 10.4 10*3/uL (ref 4.0–10.5)
WBC: 10.9 10*3/uL — ABNORMAL HIGH (ref 4.0–10.5)

## 2010-05-02 LAB — PROTIME-INR
INR: 1 (ref 0.00–1.49)
INR: 1.61 — ABNORMAL HIGH (ref 0.00–1.49)
Prothrombin Time: 13.4 seconds (ref 11.6–15.2)
Prothrombin Time: 19.3 seconds — ABNORMAL HIGH (ref 11.6–15.2)

## 2010-05-02 LAB — APTT: aPTT: 32 seconds (ref 24–37)

## 2010-05-21 ENCOUNTER — Telehealth: Payer: Self-pay | Admitting: *Deleted

## 2010-05-21 NOTE — Telephone Encounter (Signed)
I agree. Thank you for arranging to have her seen in clinic and for keeping me informed.

## 2010-05-21 NOTE — Telephone Encounter (Signed)
Pt calls and states she has been taking morphine for several months and appr 3 days ago she became disoriented after taking it stating that she was almost unconscious. i ask if she was taken to the ED, she stated no, she didn't want to go. She states she has not taken it since. She wishes to be seen. Denies any immediate needs. appt is given for 4/4 at 1015 and she is instructed to bring all meds but most especially the morphine- bottle and tablets- she is agreeable w/ the plan

## 2010-05-22 ENCOUNTER — Ambulatory Visit (INDEPENDENT_AMBULATORY_CARE_PROVIDER_SITE_OTHER): Payer: Medicare Other | Admitting: Internal Medicine

## 2010-05-22 ENCOUNTER — Encounter: Payer: Self-pay | Admitting: Internal Medicine

## 2010-05-22 VITALS — BP 147/93 | HR 101 | Temp 99.2°F | Wt 320.8 lb

## 2010-05-22 DIAGNOSIS — M25569 Pain in unspecified knee: Secondary | ICD-10-CM

## 2010-05-22 MED ORDER — HYDROCODONE-ACETAMINOPHEN 10-500 MG PO TABS: 1.0000 | ORAL_TABLET | Freq: Four times a day (QID) | ORAL | Status: AC | PRN

## 2010-05-22 NOTE — Progress Notes (Signed)
  Subjective:    Patient ID: [name], female    DOB: 24-Jun-1955, 55 y.o.   MRN: [id number]  HPI Disturbance is a 55 year old female with past medical history  Past Medical History  Diagnosis Date  . Depression   . Hypertension   . Thyroid disease   . GERD (gastroesophageal reflux disease)   . Pancreatitis     hx of  . Knee pain, bilateral   . Obesity    Patient was recently seen by Dr. Harlow Mares for bilateral knee pain. Patient had been on oral Vicodin but had been using 4-5 tablets a day which was more red than what was prescribed, one of the other problems of Vicodin at that time was there was not giving her long enough relief. Patient comes back today with excessive sleepiness, disorientation and not able to tolerate morphine. Patient is currently oriented but seems sleepy.  No other complaints   Review of Systems  Constitutional: Negative for fever, activity change and appetite change.  HENT: Negative for sore throat.   Respiratory: Negative for cough and shortness of breath.   Cardiovascular: Negative for chest pain and leg swelling.  Gastrointestinal: Negative for nausea, abdominal pain, diarrhea, constipation and abdominal distention.  Genitourinary: Negative for frequency, hematuria and difficulty urinating.  Neurological: Positive for dizziness and light-headedness. Negative for headaches.  Psychiatric/Behavioral: Positive for confusion. Negative for suicidal ideas and behavioral problems.       Objective:   Physical Exam  Constitutional: She is oriented to person, place, and time. She appears well-developed and well-nourished.  HENT:  Head: Normocephalic and atraumatic.  Eyes: Conjunctivae and EOM are normal. Pupils are equal, round, and reactive to light. No scleral icterus.  Neck: Normal range of motion. Neck supple. No JVD present. No thyromegaly present.  Cardiovascular: Normal rate, regular rhythm, normal heart sounds and intact distal pulses.  Exam reveals  no gallop and no friction rub.   No murmur heard. Pulmonary/Chest: Effort normal and breath sounds normal. No respiratory distress. She has no wheezes. She has no rales.  Abdominal: Soft. Bowel sounds are normal. She exhibits no distension and no mass. There is no tenderness. There is no rebound and no guarding.  Musculoskeletal: Normal range of motion. She exhibits no edema and no tenderness.  Lymphadenopathy:    She has no cervical adenopathy.  Neurological: She is alert and oriented to person, place, and time.  Psychiatric: She has a normal mood and affect. Her behavior is normal.          Assessment & Plan:

## 2010-05-22 NOTE — Assessment & Plan Note (Signed)
I would restart the patient on Vicodin 10 mg tablet every 4-6 hours for pain control. I would discontinue warfarin sulfate which was initially prescribed by Dr. Harlow Mares at the last office visit. Patient brought pill box with her which contain 26 tablets of morphine sulfate 15 mg ER knee and Ulis Rias Flushed the tablets. I called Dr. Harlow Mares and explain to her about the problems that she's been having on morphine and she agreed with the plan I would ask the patient is up an appointment with Dr. Harlow Mares in one month time to discuss about all the recent changes.

## 2010-06-27 ENCOUNTER — Other Ambulatory Visit: Payer: Self-pay | Admitting: Internal Medicine

## 2010-06-27 NOTE — Telephone Encounter (Signed)
Last given script 05/22/2010

## 2010-06-28 NOTE — Telephone Encounter (Signed)
Please phone in Rx refill

## 2010-07-01 NOTE — Telephone Encounter (Signed)
Called to pharm 

## 2010-07-02 NOTE — Op Note (Signed)
NAMECALIYAH, [name]              ACCOUNT NO.:  [id number]   MEDICAL RECORD NO.:  [id number]          PATIENT TYPE:  INP   LOCATION:  5021                         FACILITY:  [facility]   PHYSICIAN:  Newt Minion, MD     DATE OF BIRTH:  [birth year]   DATE OF PROCEDURE:  DATE OF DISCHARGE:                               OPERATIVE REPORT   PREOPERATIVE DIAGNOSIS:  Osteoarthritis right knee.   POSTOPERATIVE DIAGNOSIS:  Osteoarthritis right knee.   PROCEDURE:  Right total knee arthroplasty with a #3 femur, #3 tibia, 35-  mm patella, and a 12.5-mm poly tray.   SURGEON:  Newt Minion, MD   ASSISTANTBenjie Karvonen, [facility]   ANESTHESIA:  General plus femoral block.   ESTIMATED BLOOD LOSS:  Minimal.   ANTIBIOTICS:  1 g of Kefzol.   DRAINS:  None.   COMPLICATIONS:  None.   TOURNIQUET TIME:  None.   DISPOSITION:  To PACU in stable condition.   INDICATIONS FOR PROCEDURE:  The patient is a 55 year old woman morbidly  obese with a BMI greater than 50 who has had a chronic right knee pain.  She currently cannot ambulate for activities of daily living due to  pain.  She has to hold onto the wall to ambulate down the hall.  Due to  failure of conservative care and pain with activities of daily living,  the patient wish to proceed with total knee arthroplasty at this time.  Risks and benefits were discussed including infection, neurovascular  injury, persistent pain, DVT, pulmonary embolus, need for additional  surgery.  The patient states she understands and wish to proceed at this  time.   DESCRIPTION OF PROCEDURE:  The patient was brought to the OR room-4  after undergoing a femoral block.  She then underwent general  anesthetic.  After adequate level of anesthesia obtained, the patient's  right lower extremity was prepped using DuraPrep and draped in a sterile  field.  [name] was used to cover all exposed skin.  A midline incision  was made.  This was carried down and a medial parapatellar  retinacular  incision was made.  The patella was everted and the drill was used to  start the IM guide down the femur.  The guide was placed, 11 mm was  taken off the distal femur, 5 degrees of valgus.  This was then sized  for size 3 and the size 3 chamfer block was placed and the chamfer cuts  were made for the size 3 femur.  Attention was then focused on the  tibia.  Using external alignment guide, the tibia was transected  parallel and perpendicular to the axis of the tibia.  A 10-mm was taken  off the tibia.  This was then sized for a size 3 and the size 3 trial  cutting block was placed with the keel punch made for the size 3 tibia.  Attention was then focused on the femur.  The box cut was then placed on  the femur and the box cut was made for the posterior stabilized femur.  The trial femoral component  was placed.  This was then drilled for lugs.  The knee was tried with both the 10- and 12.5-mm poly tray posterior  stabilized.  She had a better stability with the 12.5-mm poly tray.  The  trial instruments were removed.  The popliteal fossa was then injected  with a total of 50 mL of 0.25% Marcaine plain.  Care was taken not to be  in intravascular injection.  The wound was irrigated with pulse lavage.  The patella was resurfaced with 10 mm taken off the patella and this was  sized for 35 and the peg cuts were made for the size 35 patella.  After  pulse lavage, the tibial tray was cemented in place, loose cement was  removed.  The femoral component was cemented in place.  Loose cement was  removed and the patella was cemented in place and a clamp was left in  place.  The wound was irrigated with pulse lavage and the tibial tray  was placed and the knee was kept in extension until the cement hardened.  The knee was placed through a full range of motion, the patella tracked  midline.  The retinaculum was closed using #1 Vicryl.  Subcu was closed  using 2-0 Vicryl.  Skin was closed  using approximate staples.  The wound  was covered with Adaptic orthopedic sponges, ABD dressing, Webril, and  Coban.  The patient was then extubated and taken to PACU in stable  condition.  Plan for discharge once she is safe with ambulation.      Newt Minion, MD  Electronically Signed     MVD/MEDQ  D:  07/08/2007  T:  07/09/2007  Job:  [id number]

## 2010-07-02 NOTE — Discharge Summary (Signed)
[name], [name]              ACCOUNT NO.:  [id number]   MEDICAL RECORD NO.:  [id number]          PATIENT TYPE:  INP   LOCATION:  2233                         FACILITY:  [facility]   PHYSICIAN:  Newt Minion, MD     DATE OF BIRTH:  1955-11-06   DATE OF ADMISSION:  08/12/2007  DATE OF DISCHARGE:  08/16/2007                               DISCHARGE SUMMARY   FINAL DIAGNOSIS:  Dislocation, right total knee arthroplasty.   PROCEDURE:  Polyethylene revision, tibial component, right total knee  arthroplasty.   Discharged to home in stable condition.  Follow up in the office in 1  week.   HISTORY OF PRESENT ILLNESS:  The patient is a 55 year old woman who was  recently discharged from the hospital.  She is morbidly obese.  She was  working with therapy and sustained a dislocation of the right total knee  arthroplasty.  She presents at this time for revision surgery.  The  patient's hospital course essentially unremarkable.  She underwent  revision total knee arthroplasty on August 12, 2007.  She received 2 g of  Kefzol for infection prophylaxis.  A tourniquet was not used.  Evaluation showed the patient to have dislocated the tibial component of  her total knee arthroplasty.  She underwent revision of the polyethylene  component.  Postoperatively, the patient progressed well.  Her  hemoglobin was 10.5 on August 13, 2007.  She was started with physical  therapy and was kept in the immobilizer for stability.  On August 14, 2007, her hemoglobin dropped to 7.7 and she was typed and crossed and  transfused with 2 units of packed red blood cells.  Her renal function  remained stable.  The patient was able to be discharged to home after  she was safe with physical therapy on August 16, 2007, with followup in  the office in 2 weeks.      Newt Minion, MD  Electronically Signed     MVD/MEDQ  D:  09/16/2007  T:  09/16/2007  Job:  [id number]

## 2010-07-02 NOTE — Op Note (Signed)
[name], [name]              ACCOUNT NO.:  [id number]   MEDICAL RECORD NO.:  [id number]          PATIENT TYPE:  INP   LOCATION:  4128                         FACILITY:  [facility]   PHYSICIAN:  Newt Minion, MD     DATE OF BIRTH:  [birth year]   DATE OF PROCEDURE:  DATE OF DISCHARGE:                               OPERATIVE REPORT   PREOPERATIVE DIAGNOSIS:  Dislocated right total knee arthroplasty.   POSTOPERATIVE DIAGNOSIS:  Dislocated right total knee arthroplasty.   PROCEDURE:  Poly revision tibia, right total knee arthroplasty with a 20-  mm poly posterior stabilized.   SURGEON.:  Newt Minion, MD.   ASSISTANT:  Epimenio Foot, P.A.   ANESTHESIA:  General.   ESTIMATED BLOOD LOSS:  Minimal.   ANTIBIOTICS:  A 2 g of Kefzol.   DRAINS.:  None.   COMPLICATIONS:  None.   TOURNIQUET TIME:  None.   DISPOSITION:  To PACU in stable condition.   The patient is a 55 year old woman who is status post right total knee  arthroplasty.  She was working at home, sustained a varus stress to the  right knee sustaining a lateral dislocation of the total knee  arthroplasty.  The patient presents at this time for revision of total  knee.  Risks and benefits of surgery were discussed with the patient and  the family including infection, neurovascular injury, persistent pain,  recurrent dislocation, need for additional surgery.  The patient and  family state their understanding and wished to proceed at this time.   DESCRIPTION OF PROCEDURE:  The patient was brought to OR room 10 and  underwent a general anesthetic.  After adequate level of anesthesia  obtained, the patient's right lower extremity was prepped using  DuraPrep, draped in a sterile field.  An [name] was used to cover all  exposed skin.  A midline incision was made.  This carried down through  the medial parapatellar retinacular incision.  Visualization showed  complete dislocation of the polyethylene.  There were no signs  of  infection.  No significant fluid accumulation.  The wound was irrigated  with pulsatile lavage.  The polyethylene was removed and this was  sequentially increased to 12.5 up to 20 mm.  A 20 mm had good stable  fixation; however, left the patient in a slight amount of flexion.  It  was felt that this would be more important to have a good stable knee  rather than painful extension.  After trial components were tried, the  trials were removed.  The knee again was irrigated with pulsatile  lavage.  A lateral release was performed due to the lateral tilt of the  patella.  The 20 mm polyethylene was placed.  This left the patient with  about 5 degrees flexion contracture.  She was placed through a full  range of motion with varus and valgus stress.  Knee was stable.  The  lateral release maintained the patella midline.  The medial patellar  retinacular incision was then closed using #1 Vicryl.  After further  irrigation debridement, pulsatile lavage,  the subcu  and fascial layers  were closed with 0 and 2-0 Vicryl.  Skin was closed using approximating  staples.  The wound was covered with Adaptic orthopedic sponges, ABDs,  Webril, and Coban.  The patient was placed in a knee immobilizer,  extubated, taken to PACU in stable condition.  C-arm fluoroscopy in the  OR showed congruent total knee.  Plan to keep her in the knee  immobilizer and then transition her to a Bledsoe brace.  Discharge once,  she is stable with ambulation.      Newt Minion, MD  Electronically Signed     MVD/MEDQ  D:  08/12/2007  T:  08/13/2007  Job:  [id number]

## 2010-07-05 NOTE — Procedures (Signed)
NAME:  [name], [name]              ACCOUNT NO.:  [id number]   MEDICAL RECORD NO.:  [id number]          PATIENT TYPE:  OUT   LOCATION:  SLEEP CENTER                 [facility]ACILITY[facility]   PHYSICIAN:  Clinton D. Annamaria Boots, M.D. DATE OF BIRTH:  January 29, 1956   DATE OF STUDY:  06/25/2004                              NOCTURNAL POLYSOMNOGRAM   REFERRING PHYSICIAN:  Dr. Lucianne Lei   DATE OF STUDY:  Jun 25, 2004   INDICATION FOR STUDY:  Hypersomnia with sleep apnea.  BMI 46, weight 289  pounds.   SLEEP ARCHITECTURE:  Total sleep time 345 minutes with sleep efficiency 69%.  Stage I was 33%, stage II 42%, stages III and IV 3% and REM was 22% of total  sleep time.  Sleep latency 22 minutes, REM latency 95 minutes, awake after  sleep onset 81 minutes, arousal index increased at 64.  It is not stated  whether she took her Vicodin on this study night.   RESPIRATORY DATA:  CPAP titration protocol.  CPAP was titrated to 19 CWP and  then bilevel pressures were tried.  Complete control was not achieved at any  pressure.  Suggested starting pressure will be 13 CWP (RDI 18.4 per hour).  A small ComfortGel Mask was used with heated humidifier.  The technician  also added a chin strap for mouth breathing.   OXYGEN DATA:  Some snoring persisted especially when sleeping supine.  Oxygen saturation nadir was 85% with mean oxygen saturation on CPAP control  95% on room air.   CARDIAC DATA:  Normal sinus rhythm.   MOVEMENT/PARASOMNIA:  Occasional leg jerks with little sleep disturbance.   IMPRESSION/RECOMMENDATION:  1.  Complete continuous positive airway pressure control was not achieved.      This may reflect persistent significant upper airway anatomic      obstruction which could be evaluated by ENT for surgical options.  2.  Suggested initial continuous positive airway pressure for home trial      would be 13 CWP which gave an RDI of      18.4 per hour on this study.  A small ComfortGel Mask was used with  chin      strap and heated humidifier.  3.  Her diagnostic NPSG on June 12, 2004 recorded an respiratory      disturbance index of 59.8 per hour.      CDY/MEDQ  D:  06/30/2004 13:17:33  T:  06/30/2004 18:23:43  Job:  [id number]

## 2010-07-05 NOTE — H&P (Signed)
[facility]  Patient:    [name], [name]                       MRN: [id number] Adm. Date:  [id number] Disc. Date: [id number] Attending:  Woody Seller CC:         Frederico Hamman, M.D.             Caswell Corwin, N.P.                         History and Physical  CHIEF COMPLAINT:  This 55 year old black female referred by Dr. Frederico Hamman for an evaluation of a newly-diagnosed pelvic mass.  HISTORY OF PRESENT ILLNESS:  The patient has a past history of having had an abdominal hysterectomy approximately 20 years ago for abnormal bleeding.  Over he past several weeks she has had lower abdominal cramps and increasing dyspareunia and a feeling of an occasional "knot" in her lower abdomen.  She saw Dr. Ruthann Cancer on August 02, 1999, who apparently felt a mass, and documented this on ultrasound. The ultrasound shows that she has a 7.3 cm x 5.9 cm x 6.9 cm complex ovarian cyst, versus an endometrioma.  She has had a CA125 value drawn which is 8.1 units per ml. The patient denies any other GI or GU symptoms.  She has had no weight loss.  PAST GYNECOLOGIC HISTORY:  Essentially negative except for a prior hysterectomy.  PAST MEDICAL HISTORY:  Hypertension.  PAST SURGICAL HISTORY:  Thyroidectomy for a goiter, D&C, total abdominal hysterectomy, rotator cuff repair, a breast biopsy for benign disease.  CURRENT MEDICATIONS: 1. Synthroid. 2. Prozac. 3. Lorazepam p.r.n. 4. Antihypertensive agent which she does not know the name of.  ALLERGIES:  No known drug allergies.  FAMILY HISTORY:  The patients mother had breast cancer, who ultimately developed bony metastases.  She also claims the mother had ovarian cancer.  She also claims she has two sisters who have had ovarian cancer.  Both are in remission after taking chemotherapy.  Both were in their 97s when they developed ovarian cancer.  REVIEW OF SYSTEMS:  Essentially negative.  PHYSICAL  EXAMINATION:  VITAL SIGNS:  Height 5 feet 6-1/2 inches, weight 268 pounds, blood pressure 145/102, pulse 80, respirations 18.  GENERAL:  The patient is a pleasant, healthy, obese black female, in no acute distress.  HEENT:  Negative.  NECK:  Supple, without thyromegaly.  NODES:  There is on supraclavicular or inguinal adenopathy.  ABDOMEN:  Obese, soft, nontender.  No masses, organomegaly, ascites, or hernia noted.  PELVIC:  EG, BUS normal.  Vagina is clean, well-supported.  No lesions are noted.  BIMANUAL AND RECTOVAGINAL:  Examinations are essentially negative, except for some discomfort deep in the pelvis.  I do not specifically feel a mass.  IMPRESSION/RECOMMENDATIONS:  A complex mass on ultrasound obtained five days ago. It is certainly possible that the mass is not felt due to the patients habitus.  On the other hand, I would like to be certain that she does have a persistent mass, and have recommended that she have an ultrasound next Monday, before anticipated surgery on Wednesday.  Certainly there is a possibility that this could be a corpus luteum which would be resolving, thus obviating the need for surgery.  On the other hand, if the patient does need surgery, I have advised that both ovaries be removed, given her strong family history of ovarian cancer.  She is ery much interested in this approach.  She is informed that Dr. Jenny Reichmann T. Clarene Essex will e the gynecologic oncologist in attendance at the surgery with Dr. Ruthann Cancer, and s fine with this plan. DD:  08/07/99 TD:  08/07/99 Job: [id number]

## 2010-07-05 NOTE — Procedures (Signed)
[place]. [facility]  Patient:    [name], [name]                       MRN: [id number] Proc. Date: 05/30/99 Adm. Date:  [id number] Attending:  Woody Seller CC:         Elyn Peers, M.D.                           Procedure Report  REFERRING PHYSICIAN:  Elyn Peers, M.D.  PREOPERATIVE DIAGNOSES: 1. Dysphagia. 2. Gastroesophageal reflux symptoms.  POSTOPERATIVE DIAGNOSES: 1. Mild duodenitis. 2. Distal esophageal web. 3. Normal gastric region that is noted.  PROCEDURE:  Esophagogastroduodenoscopy with biopsies.  ENDOSCOPIST:  Katherina Mires, Brooke Bonito., M.D.  MEDICATIONS:  Demerol 40 mg IV and Versed 3 mg IV over 10 minute period of time.  INSTRUMENT:  Olympus video panendoscope.  INDICATIONS:  This is a pleasant 55 year old female who was referred for an evaluation of the mucosa.  Severe epigastric pains and discomforts that is ongoing at this time.  She also noted dysphagia with food particles which she had eaten and started getting stuck in the distal portion of the esophageal region that is noted. She _______ the products, but no evidence of any hematemesis or melena noted. There is no evidence of any type of mucosal disease that is presently ongoing.  OBJECTIVE FINDINGS:  She is a pleasant female, obese, in no acute distress. Vital signs are stable.  HEENT examination is anicteric.  Neck was supple.  Lungs are  clear to auscultation and percussion.  Heart had a regular rate and rhythm without heaves, thrills, murmurs, or gallops.  The abdomen is soft, no tenderness, no hepatosplenomegaly appreciated.  Extremities appeared to be within normal limits.  PLAN:  I am presently going to proceed with the endoscopic examination.  INFORMED CONSENT:  The patient and his spouse were advised of the procedure, indications, and risks involved.  He has agreed to have the procedure performed.  PREOPERATIVE PREPARATION:  The patient was brought to the  endoscopy unit where n IV for IV sedating medication was started.  A monitor was placed on the patient to monitor the patients vital signs and oxygen saturation.  Nasal oxygen at 2 L per minute was used, and after adequate sedation was performed, the procedure was begun.  DESCRIPTION OF PROCEDURE:  The instrument was advanced with the patient lying in the left lateral position via direct technique without difficulty.  The oropharyngeal, epiglottis, vocal cords, and piriform sinuses appeared to be grossly within normal limits.  The esophagus was normal without any evidence of acute inflammation, ulcerations, hiatal hernia, or varices appreciated.  There appeared to be evidence of a distal ring that appeared to be noted at this time, but no abnormalities.  No classified as a Schatzkis ring due to the fact that there was no evidence of a hiatal hernia that was noted.  The gastric area showed a normal mucous leak without any evidence of acute inflammatory changes or ulcerative changes that was noted.  The antral area appeared to be grossly within normal limits without any deformities that was noted. The pylorus was normal and upon advancing to the pyloric canal, the duodenal bulb showed evidence of post bulbar inflammation that was noted at this time.  The second portion of the duodenum appeared to be unremarkable.  The instrument was  subsequently retracted back where the biopsy for the  CLO study was performed. Retroflexed view of the cardia revealed no evidence of hiatal hernia that was noted at this time.  The Z-line appeared to be 40 cm distal to the esophagus that was  noted.  The instrument was subsequently retracted back into the esophageal proper where there was no evidence of any inflammatory changes or any evidence of a Barretts esophagus, or reflux symptoms that was noted.  The instrument was gradually removed per oral without difficulty and the patient tolerated the  procedure well.  TREATMENT: 1. I am going to start the patient with Prevacid 30 mg on a daily basis. 2. I am going to await the results of the CLO study. 3. Have the patient follow up in the office and depending upon the results will  determine the course of therapy. DD:  05/30/99 TD:  05/30/99 Job: [id number]

## 2010-08-06 ENCOUNTER — Other Ambulatory Visit: Payer: Self-pay | Admitting: Internal Medicine

## 2010-08-11 ENCOUNTER — Other Ambulatory Visit: Payer: Self-pay | Admitting: Internal Medicine

## 2010-08-12 NOTE — Telephone Encounter (Signed)
Hydrocodone/Acet 10-536m rx refilled- request form faxed to [facility]

## 2010-08-19 ENCOUNTER — Other Ambulatory Visit: Payer: Self-pay | Admitting: Internal Medicine

## 2010-08-20 NOTE — Telephone Encounter (Signed)
Message sent to front desk for an appt.

## 2010-08-20 NOTE — Telephone Encounter (Signed)
This medication was recently refilled on 6/24; I declined the request because it was early.  Also, please schedule a follow-up appointment for patient.

## 2010-09-08 ENCOUNTER — Other Ambulatory Visit: Payer: Self-pay | Admitting: Internal Medicine

## 2010-09-20 ENCOUNTER — Encounter: Payer: Self-pay | Admitting: Internal Medicine

## 2010-09-20 ENCOUNTER — Other Ambulatory Visit: Payer: Self-pay | Admitting: Internal Medicine

## 2010-09-20 ENCOUNTER — Ambulatory Visit (INDEPENDENT_AMBULATORY_CARE_PROVIDER_SITE_OTHER): Payer: Medicare Other | Admitting: Internal Medicine

## 2010-09-20 VITALS — BP 138/73 | HR 110 | Temp 99.5°F | Wt 331.9 lb

## 2010-09-20 DIAGNOSIS — L819 Disorder of pigmentation, unspecified: Secondary | ICD-10-CM

## 2010-09-20 DIAGNOSIS — Z1239 Encounter for other screening for malignant neoplasm of breast: Secondary | ICD-10-CM

## 2010-09-20 DIAGNOSIS — Z1231 Encounter for screening mammogram for malignant neoplasm of breast: Secondary | ICD-10-CM

## 2010-09-20 MED ORDER — ALBUTEROL SULFATE HFA 108 (90 BASE) MCG/ACT IN AERS: 2.0000 | INHALATION_SPRAY | Freq: Four times a day (QID) | RESPIRATORY_TRACT | Status: DC | PRN

## 2010-09-20 MED ORDER — LEVOTHYROXINE SODIUM 175 MCG PO TABS: 175.0000 ug | ORAL_TABLET | Freq: Every day | ORAL | Status: DC

## 2010-09-20 MED ORDER — FUROSEMIDE 40 MG PO TABS: 40.0000 mg | ORAL_TABLET | Freq: Every day | ORAL | Status: DC

## 2010-09-20 MED ORDER — PAROXETINE HCL 20 MG PO TABS: 20.0000 mg | ORAL_TABLET | Freq: Every day | ORAL | Status: DC

## 2010-09-20 MED ORDER — QUETIAPINE FUMARATE 300 MG PO TABS: 900.0000 mg | ORAL_TABLET | Freq: Every day | ORAL | Status: DC

## 2010-09-20 MED ORDER — BUDESONIDE-FORMOTEROL FUMARATE 80-4.5 MCG/ACT IN AERO: 1.0000 | INHALATION_SPRAY | Freq: Two times a day (BID) | RESPIRATORY_TRACT | Status: DC

## 2010-09-20 MED ORDER — ARIPIPRAZOLE 2 MG PO TABS: 2.0000 mg | ORAL_TABLET | Freq: Every day | ORAL | Status: DC

## 2010-09-20 MED ORDER — ESOMEPRAZOLE MAGNESIUM 40 MG PO CPDR: 40.0000 mg | DELAYED_RELEASE_CAPSULE | Freq: Every day | ORAL | Status: DC

## 2010-09-20 MED ORDER — PRAVASTATIN SODIUM 80 MG PO TABS: 80.0000 mg | ORAL_TABLET | Freq: Every day | ORAL | Status: DC

## 2010-09-20 MED ORDER — BUPROPION HCL ER (SR) 150 MG PO TB12: 450.0000 mg | ORAL_TABLET | Freq: Every day | ORAL | Status: DC

## 2010-09-20 MED ORDER — TRAZODONE HCL 100 MG PO TABS: 100.0000 mg | ORAL_TABLET | Freq: Every day | ORAL | Status: DC

## 2010-09-20 MED ORDER — ZOLPIDEM TARTRATE 10 MG PO TABS: 10.0000 mg | ORAL_TABLET | Freq: Every evening | ORAL | Status: DC | PRN

## 2010-09-20 MED ORDER — LORAZEPAM 1 MG PO TABS: 1.0000 mg | ORAL_TABLET | Freq: Three times a day (TID) | ORAL | Status: DC | PRN

## 2010-09-20 NOTE — Progress Notes (Signed)
Subjective:    Patient ID: [name], female    DOB: 1955/11/06, 55 y.o.   MRN: [id number] HPI: This is a 55 YO woman who is obese. She comes in today for a renewal of several medications, as well as getting her handicap license plate renewed. She is not having any shortness of breath today, however she endorses that with exercise she does get short of breath. She has been using her Symbicort as well as her albuterol inhaler for this. She notes that her mood is improved on her medications, she does see Dr. Roosevelt Locks for those medications. She states that her GERD is controlled 95% of the time. She does state that she has chronic pain stemming from her knees, in particular the left knee more than the right knee. She did have both of these knees replaced, and does see orthopedics for this pain. We did discuss health maintenance, and she is willing to have a mammogram so we did schedule her one today. We did discuss colonoscopy, and she will think about this for the future. We will discuss at the next visit. We did discuss Pap smear, and she states that she has had one in the past. She would like to think about whether she wants to get another one or not. We will reassess at next visit. She does have a hypopigmented lesion right above the left eye, and would like to see dermatology for this. Shortness of Breath This is a chronic problem. The current episode started more than 1 year ago. The problem occurs intermittently. The problem has been unchanged. Pertinent negatives include no abdominal pain, chest pain, claudication, fever, leg swelling or wheezing. The symptoms are aggravated by exercise. She has tried steroid inhalers and beta agonist inhalers for the symptoms. The treatment provided moderate relief. There is no history of bronchiolitis, CAD, chronic lung disease, COPD, DVT, a heart failure, PE, pneumonia or a recent surgery.      Review of Systems  Constitutional: Positive for  activity change. Negative for fever, chills, appetite change, fatigue and unexpected weight change.       She is unable to exercise due to the pain in her knees.  HENT: Negative.   Respiratory: Positive for shortness of breath. Negative for apnea, cough, choking, chest tightness, wheezing and stridor.   Cardiovascular: Negative for chest pain, palpitations, claudication and leg swelling.  Gastrointestinal: Negative for nausea, abdominal pain, diarrhea, constipation and abdominal distention.  Skin:       Hypo-pigmented lesion directly above the left eye closer to midline. Unchanged over time per pt.  Psychiatric/Behavioral: Positive for sleep disturbance and dysphoric mood. Negative for suicidal ideas, hallucinations, behavioral problems, confusion, self-injury, decreased concentration and agitation. The patient is not nervous/anxious and is not hyperactive.        Per pt, her dog does keep her from getting a good night's sleep.   Vitals: BP 138/73 Temperature 99.5 Fahrenheit Pulse 110(per looking back at flow sheet this is the patient's baseline, patient did not appear to be in distress) Weight 331 pounds    Objective:   Physical Exam  Constitutional: She is oriented to person, place, and time.  Neck: Normal range of motion. Neck supple.  Cardiovascular: Normal rate and regular rhythm.        Heart sounds slightly distant due to body habitus.  Pulmonary/Chest: Effort normal and breath sounds normal. No respiratory distress. She has no wheezes. She has no rales. She exhibits no tenderness.  Slightly distant sounds due to body habitus.  Abdominal: Soft. Bowel sounds are normal. She exhibits no distension. There is no tenderness. There is no rebound and no guarding.  Lymphadenopathy:    She has no cervical adenopathy.  Neurological: She is alert and oriented to person, place, and time. No cranial nerve deficit.  Skin: Skin is warm and dry.       A hypopigmented, slightly raised lesion  on the brow above the L eye. <1 mm diameter.  Psychiatric: She has a normal mood and affect.          Assessment & Plan:  1. Shortness of breath/Asthma-this is probably exacerbated by the patient's weight, and she does have problems breathing with exercise. Her exercise tolerance is further limited by the pain in her knees. She is on Symbicort inhaler as well as albuterol inhaler at this time. With good results. I did encourage increased amounts of exercise, as well as increasing her exercise tolerance.  2. Depression-Dr. Matthew Saras does manage her depression, and it does seem to be well-controlled at this time. She is taking Ambien 10 mg at night, trazodone 100 mg at night, Seroquel 900 mg per day, Paxil 20 mg per day, Ativan 1 mg 3 times a day, Wellbutrin 450 mg per day. I would question the necessity of so many medications, however I will let Dr. Matthew Saras manage this problem.  3. GERD-she is well-controlled at this time. No changes. She is taking Nexium 40 mg daily.   4. Obesity-I did encourage her to improve her diet, as well as try to lose some weight. I did discuss exercise with her, and she does state that she tries to exercise however she is limited by the pain in her knees.  5. Hypothyroidism-she is currently on 175 mcg of Synthroid. Last TSH in January of 2012 was normal. Will get a repeat TSH in January 2013. No change in medication at this time.  6. Hypertension-AP today 138/73. She is taking lisinopril 20 mg and Lasix 40 mg daily.  7. 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 She does still have chronic pain in her knees left greater than right. She is taking hydrocodone/acetaminophen 10 mg/500 mg every 6 hours as needed for pain. She states that on bad days she takes up to 6 pills. On an average day she takes 4 pills. On a very good day and she takes 2 pills. She is also taking Mobic 15 mg per day and she is also taking Voltaren gel which is  diclofenac topical 1%.  8. Health maintenance-I did discuss the possibility of colonoscopy and Pap smear with her. She would like more time to think about them. We'll rediscuss at next visit. I did schedule her for mammogram today.  9. Hypopigmented lesion- It does not appear to be cancerous or infected or a skin tag. However, the patient would like to have it removed, so I will refer her to dermatology.  10. 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 Stable. No changes at this time.

## 2010-09-20 NOTE — Patient Instructions (Signed)
You were seen today for a follow up for your medical problems. We have scheduled you for a mammogram. We did discuss the possibility of getting a colonoscopy in the future, as well as getting a Pap smear in the future. We filled out a handicapped application for you so you can get there and along her permit. We discussed the possibility of getting a nebulizer, but we didn't feel it was appropriate for you at this time. We have scheduled a followup for 3 months, however she feel he needed to be seen before then please call our office. Our number is [phone].  Mammogram A mammogram is an x-ray to find changes in a woman's breast. If the change is cancer, finding it early is the best way to get treated and cured.  WHO SHOULD GET A MAMMOGRAM?  Women over 10 years of age.   Women with risk factors and whose doctor says they should get a mammogram.  [place] MAMMOGRAM? Do not schedule the week before your period, especially if your breasts are sore during this time. On the day of your mammogram At home:  [facility] your breasts and armpits well. This helps to get rid of all deodorant and powder. After washing, do not put on any deodorant or talcum powder until after your test.   Eat and drink as you usually do. You may take your medicines as usual.   If you are diabetic and take insulin, make sure you:   Eat before coming for your test.   Take your insulin as usual.   If you cannot keep your appointment, call before the appointment time to let them know that you cannot come. Also, schedule another appointment.  HOW WILL MY TEST BE DONE?  You will need to undress from the waist up. You will put on a hospital gown.   The technologist doing your mammogram will put your breast on the mammogram machine, and press firmly on your breast with a piece of plastic called a compression paddle.   This will make your breast flatter so that the machine can x-ray all parts of your breast.  This may not be comfortable for you. If you have very tender breasts, you may find this a little bit painful.   Both breasts will be x-rayed. Each breast will be x-rayed from above and from the side. Once in awhile, an x-ray might need to be taken again if the picture is not good enough.   The mammogram will last about 15 to 30 minutes from the time you come to the mammogram room. After the test, you can go home, or to any other appointments you may have.  ARE THERE ANY SPECIAL INSTRUCTIONS I SHOULD FOLLOW? No. After your mammogram, you can go back to your usual activities. HOW WILL I FIND OUT THE RESULTS OF MY TEST?  If you are in the hospital, the doctor who ordered your test will tell you about your test results when they are ready.   If you are a clinic patient, your doctor will tell you about your results at your next clinic appointment.   Be sure to keep this important appointment. If you do not have a follow-up clinic appointment, call and schedule one with your doctor.  Easy-to-Read style based on content from [facility], [place], [place] Document Released: 05/02/2008  [facility] Patient Information 2011 [place].Pap Smear A Pap smear is a sampling of cells from a woman's  cervix. The cervix is the opening between the vagina (birth canal) and the uterus (the bottom part of the womb). The cells are scraped from the cervix during a pelvic exam. These cells are then looked at under a microscope to see if the cells are normal or to see if a cancer is developing or there are changes that suggest a cancer will develop. Cervical dysplasia is a condition in which a woman has abnormal changes in the top layer of cells of her cervix. These changes are an early sign that cervical cancer may develop. Pap smears also look for the human papilloma virus (HPV) because it has 4 types that are responsible for 70% of cervical cancer. Infections can also be found during a Pap smear such  as bacteria, fungus, protozoa and viruses.  Cervical cancer is harder to treat and less likely to have a good outcome if left untreated. Catching the disease at an early stage leads to a better outcome. Since the Pap smear was introduced 60 years ago, deaths from cervical cancer have decreased by 70%. Every woman should keep up to date with Pap smears. RISK FACTORS FOR CERVICAL CANCER INCLUDE:   Becoming sexually active before age 80.   Being the daughter of a woman who took diethylstilbestrol (DES) during pregnancy.   Having a sexual partner who has or has had cancer of the penis.   Having a sexual partner whose past partner had cervical cancer or cervical dysplasia (early cell changes which suggest a cancer may develop).   Having a weakened immune system. An example would be HIV or other immunodeficiency disorder.   Having had a sexually transmitted infection such as chlamydia, gonorrhea or HPV.   Having had an abnormal Pap smear or cancer of the vagina or vulva.   Having had more than one sexual partner.   A history of cervical cancer in a woman's sister or mother.   Not using condoms with new sexual partners.   Smoking.  WHO SHOULD HAVE PAP SMEARS  A PAP smear is done to screen for cervical cancer.   The first PAP smear should be done at age 65.   Between ages 47 and 32, PAP smears are repeated every 2 years.   Beginning at age 33, you are advised to have a PAP smear every 3 years as long as your past 3 PAP smears have been normal.   Some women have medical problems that increase the chance of getting cervical cancer. Talk to your caregiver about these problems. It is especially important to talk to your caregiver if a new problem develops soon after your last PAP smear. In these cases, your caregiver may recommend more frequent screening and Pap smears.   The above recommendations are the same for women who have or have not gotten the vaccine for HPV (Human Papillomavirus).     If you had a hysterectomy for a problem that was not a cancer or a condition that could lead to cancer, then you no longer need Pap smears.   If you are between ages 27 and 63, and you have had normal Pap smears going back 10 years, you no longer need Pap smears.   If you have had past treatment for cervical cancer or a condition that could lead to cancer, you need Pap smears and screening for cancer for at least 20 years after your treatment.   Some women may need screenings more often if they are at high risk for cervical cancer.  PREPARATION FOR A PAP SMEAR A Pap smear should be performed during the weeks before the start of menstruation. Women should not douche or have sexual intercourse for 24 hours before the test. No vaginal creams, diaphragms, or tampons should be used for 24 hours before the test. To minimize discomfort, a woman should empty her bladder just before the exam. TAKING THE PAP SMEAR The caregiver will perform a pelvic exam. A metal or plastic instrument (speculum) is placed in the vagina. This is done before your caregiver does a bimanual exam of your internal female organs. This instrument allows your caregiver to see the inside of the vagina and look at the cervix. A small, sterile brush is used to take a sample of cells from the internal opening of the cervix. A small wooden spatula is used to scrape the outside of the cervix. Neither of these two methods to collect cells will cause you pain. These two scrapings are placed on a glass slide or in a small bottle filled with a special liquid. The cells are looked at later under a microscope in a lab. A specialist will look at these cells and determine if the cells are normal. RESULTS OF YOUR PAP SMEAR  A healthy Pap smear shows no abnormal cells or evidence of inflammation.   The presence of abnormally growing cells on the surface of the cervix may be reported as an abnormal PAP smear. Different categories of findings are  used to describe your Pap smear. Your caregiver will go over the importance of these findings with you. The caregiver will then determine what follow-up is needed or when you should have your next pap smear.   If you have had two or more abnormal Pap smears:   You may be asked to have a colposcopy. This is a test in which the cervix is viewed with a special lighted microscope.   A cervical tissue sample (biopsy) may also be needed. This involves taking a small tissue sample from the cervix. The sample is looked at under a microscope to find the cause of the abnormal cells.  Make sure you find out the results of the Pap smear. If you have not received the results within two weeks, contact your caregiver's office for the results. Do not assume everything is normal if you have not heard from your caregiver or medical facility. It is important to follow up on all of your test results. Document Released: 04/26/2002 Document Re-Released: 01/17/2008 [facility] Patient Information 2011 [place].Colonoscopy A colonoscopy is an exam to evaluate your entire colon. In this exam, your colon is cleansed. A long fiberoptic tube is inserted through your rectum and into your colon. The fiberoptic scope (endoscope) is a long bundle of enclosed and very flexible fibers. These fibers transmit light to the area examined and send images from that area to your caregiver. Discomfort is usually minimal. You may be given a drug to help you sleep (sedative) during or prior to the procedure. This exam helps to detect lumps (tumors), polyps, inflammation, and areas of bleeding. Your caregiver may also take a small piece of tissue (biopsy) that will be examined under a microscope. BEFORE THE PROCEDURE  A clear liquid diet may be required for 2 days before the exam.   Liquid injections (enemas) or laxatives may be required.   A large amount of electrolyte solution may be given to you to drink over a short period of time.  This solution is used to clean out your  colon.   You should be present as instructed prior to your procedure or as directed by your caregiver.   Check in at the admissions desk to fill out necessary forms if not preregistered. There will be consent forms to sign prior to the procedure. If accompanied by friends or family, there is a waiting area for them while you are having your procedure.  LET YOUR CAREGIVER KNOW ABOUT:  Allergies to food or medicine.  Medicines taken, including vitamins, herbs, eyedrops, over-the-counter medicines, and creams.   Use of steroids (by mouth or creams).   Previous problems with anesthetics or numbing medicines.   History of bleeding problems or blood clots.  Previous surgery.   Other health problems, including diabetes and kidney problems.   Possibility of pregnancy, if this applies.   AFTER THE PROCEDURE  If you received a sedative and/or pain medicine, you will need to arrange for someone to drive you home.   Occasionally, there is a little blood passed with the first bowel movement. DO NOT be concerned.  HOME CARE INSTRUCTIONS  It is not unusual to pass moderate amounts of gas and experience mild abdominal cramping following the procedure. This is due to air being used to inflate your colon during the exam. Walking or a warm pack on your belly (abdomen) may help.   You may resume all normal meals and activities after sedatives and medicines have worn off.   Only take over-the-counter or prescription medicines for pain, discomfort, or fever as directed by your caregiver. DO NOT use aspirin or blood thinners if a biopsy was taken. Consult your caregiver for medicine usage if biopsies were taken.  FINDING OUT THE RESULTS OF YOUR TEST Not all test results are available during your visit. If your test results are not back during the visit, make an appointment with your caregiver to find out the results. Do not assume everything is normal if you have  not heard from your caregiver or the medical facility. It is important for you to follow up on all of your test results. SEEK IMMEDIATE MEDICAL CARE IF:  You have an oral temperature above 103F, not controlled by medicine.   You pass large blood clots or fill a toilet with blood following the procedure. This may also occur 10 to 14 days following the procedure. This is more likely if a biopsy was taken.   You develop abdominal pain that keeps getting worse and cannot be relieved with medicine.  Document Released: 02/01/2000 Document Re-Released: 04/30/2009 [facility] Patient Information 2011 [place].Calorie Counting Diet A calorie counting diet requires you to eat the number of calories that are right for you during a day. Calories are the measurement of how much energy you get from the food you eat. Eating the right amount of calories is important for staying at a healthy weight. If you eat too many calories your body will store them as fat and you may gain weight. If you eat too few calories you may lose weight. Counting the number of calories that you eat during a day will help you to know if you're eating the right amount. A Registered Dietitian can determine how many calories you need in a day. The amount of calories you need varies from person to person. If your goal is to lose weight you will need to eat fewer calories. Losing weight can benefit you if you are overweight or have health problems such as heart disease, high blood pressure or diabetes. If your  goal is to gain weight, you will need to eat more calories. Gaining weight may be necessary if you have a certain health problem that causes your body to need more energy. TIPS Whether you are increasing or decreasing the number of calories you eat during a day, it may be hard to get used to changing what you eat and drink. The following are tips to help you keep track of the number of calories you are eating.  Measuring foods at home  with measuring cups will help you to know the actual amount of food and number of calories you are eating.   Restaurants serve food in all different portion sizes. It is common that restaurants will serve food in amounts worth 2 or more serving sizes. While eating out, it may be helpful to estimate how many servings of a food you are given. For example, a serving of cooked rice is 1/2 cup and that is the size of half of a fist. Knowing serving sizes will help you have a better idea of how much food you are eating at restaurants.   Ask for smaller portion sizes or child-size portions at restaurants.   Plan to eat half of a meal at a restaurant and take the rest home or share the other half with a friend   Read food labels for calorie content and serving size   Most packaged food has a Nutrition Facts Panel on its side or back. Here you can find out how many servings are in a package, the size of a serving, and the number of calories each serving has.   The serving size and number of servings per container are listed right below the Nutrition Facts heading. Just below the serving information, the number of calories in each serving is listed.   For example, say that a package has three cookies inside. The Nutrition Facts panel says that one serving is one cookie. Below that, it says that there are three servings in the container. The calories section of the Nutrition Facts says there are 90 calories. That means that there are 90 calories in one cookie. If you eat one cookie you have eaten 90 calories. If you eat all three cookies, you have eaten three times that amount, or 270 calories.  The list below tells you how big or small some common portion sizes are.  1 ounce (oz).................4 stacked dice.   3 oz.............................Marland KitchenDeck of cards.   1 teaspoon (tsp)..........Marland KitchenTip of little finger.   1 tablespoon (Tbsp).Marland KitchenMarland KitchenMarland KitchenTip of thumb.   2 Tbsp.........................Marland KitchenGolf ball.     Cup.........................Marland KitchenHalf of a fist.   1 Cup..........................Marland KitchenA fist.  KEEP A FOOD LOG Write down every food item that you eat, how much of the food you eat, and the number of calories in each food that you eat during the day. At the end of the day or throughout the day you can add up the total number of calories you have eaten.  It may help to set up a list like the one below. Find out the calorie information by reading food labels.  Breakfast   Bran Flakes (1 cup, 110 calories).   Fat free milk ( cup, 45 calories).   Snack   Apple (1 medium, 80 calories).   Lunch   Spinach (1 cup, 20 calories).   Tomato ( medium, 20 calories).   Chicken breast strips (3 oz, 165 calories).   Shredded cheddar cheese ( cup, 110 calories).   Light [place] dressing (2  Tbsp, 60 calories).   Whole wheat bread (1 slice, 80 calories).   Tub margarine (1 tsp, 35 calories).   Vegetable soup (1 cup, 160 calories).   Dinner   Pork chop (3 oz, 190 calories).   Brown rice (1 cup, 215 calories).   Steamed broccoli ( cup, 20 calories).   Strawberries (1  cup, 65 calories).   Whipped cream (1 Tbsp, 50 calories).  Daily Calorie Total: 1425 Information from www.eatright.org, Foodwise Nutritional Analysis Database. Document Released: 02/03/2005 Document Re-Released: 02/25/2009 [facility] Patient Information 2011 [place].

## 2010-09-23 NOTE — Progress Notes (Signed)
I discussed the patient with Dr Doug Sou and agree with her note, assessment, and plans.

## 2010-09-26 ENCOUNTER — Encounter: Payer: Self-pay | Admitting: Internal Medicine

## 2010-10-02 ENCOUNTER — Ambulatory Visit (HOSPITAL_COMMUNITY): Payer: Medicare Other

## 2010-10-09 ENCOUNTER — Ambulatory Visit (HOSPITAL_COMMUNITY)
Admission: RE | Admit: 2010-10-09 | Discharge: 2010-10-09 | Disposition: A | Discharge status: Home / Self Care | Payer: Medicare Other | Source: Ambulatory Visit | Attending: Internal Medicine | Admitting: Internal Medicine

## 2010-10-09 DIAGNOSIS — Z1231 Encounter for screening mammogram for malignant neoplasm of breast: Secondary | ICD-10-CM | POA: Insufficient documentation

## 2010-10-25 ENCOUNTER — Ambulatory Visit (INDEPENDENT_AMBULATORY_CARE_PROVIDER_SITE_OTHER): Payer: Medicare Other

## 2010-10-25 ENCOUNTER — Inpatient Hospital Stay (INDEPENDENT_AMBULATORY_CARE_PROVIDER_SITE_OTHER)
Admission: RE | Admit: 2010-10-25 | Discharge: 2010-10-25 | Disposition: A | Discharge status: Home / Self Care | Payer: Self-pay | Source: Ambulatory Visit | Attending: Family Medicine | Admitting: Family Medicine

## 2010-10-25 DIAGNOSIS — M549 Dorsalgia, unspecified: Secondary | ICD-10-CM

## 2010-10-25 DIAGNOSIS — M79609 Pain in unspecified limb: Secondary | ICD-10-CM

## 2010-10-28 ENCOUNTER — Encounter: Payer: Self-pay | Admitting: Internal Medicine

## 2010-10-28 ENCOUNTER — Ambulatory Visit (INDEPENDENT_AMBULATORY_CARE_PROVIDER_SITE_OTHER): Payer: Medicare Other | Admitting: Internal Medicine

## 2010-10-28 ENCOUNTER — Telehealth: Payer: Self-pay | Admitting: *Deleted

## 2010-10-28 DIAGNOSIS — J45909 Unspecified asthma, uncomplicated: Secondary | ICD-10-CM

## 2010-10-28 DIAGNOSIS — M25569 Pain in unspecified knee: Secondary | ICD-10-CM
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
## 2010-10-28 NOTE — Assessment & Plan Note (Signed)
Stable. Continue current inhalers-albuterol and Symbicort.

## 2010-10-28 NOTE — Telephone Encounter (Signed)
Thanks.  [name].

## 2010-10-28 NOTE — Telephone Encounter (Signed)
Pt calls and states she feels much worse today from the [place] on the 9/6, she wishes an appt or advisement to go to ED. appt is given for today, dr patel, [phone]

## 2010-10-28 NOTE — Telephone Encounter (Signed)
Noted.
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
## 2010-10-28 NOTE — Patient Instructions (Signed)
Please make an appointment as needed if you dont feel better by a week from now. Please get the prescription for flexeril to help your pain. Also take vicodin and meloxicam along with flexeril. If you have severe worsening of pain or any other severe problem, call the clinic or go to the ER.

## 2010-10-29 IMAGING — CR DG CHEST 2V
2 series · 2 of 2 positions shown · non-contrast
Comparison: Chest x-ray of 07/19/2007

CLINICAL DATA: Shortness of breath, chest pain, history of asthma

CHEST - 2 VIEW

[w chest pa]
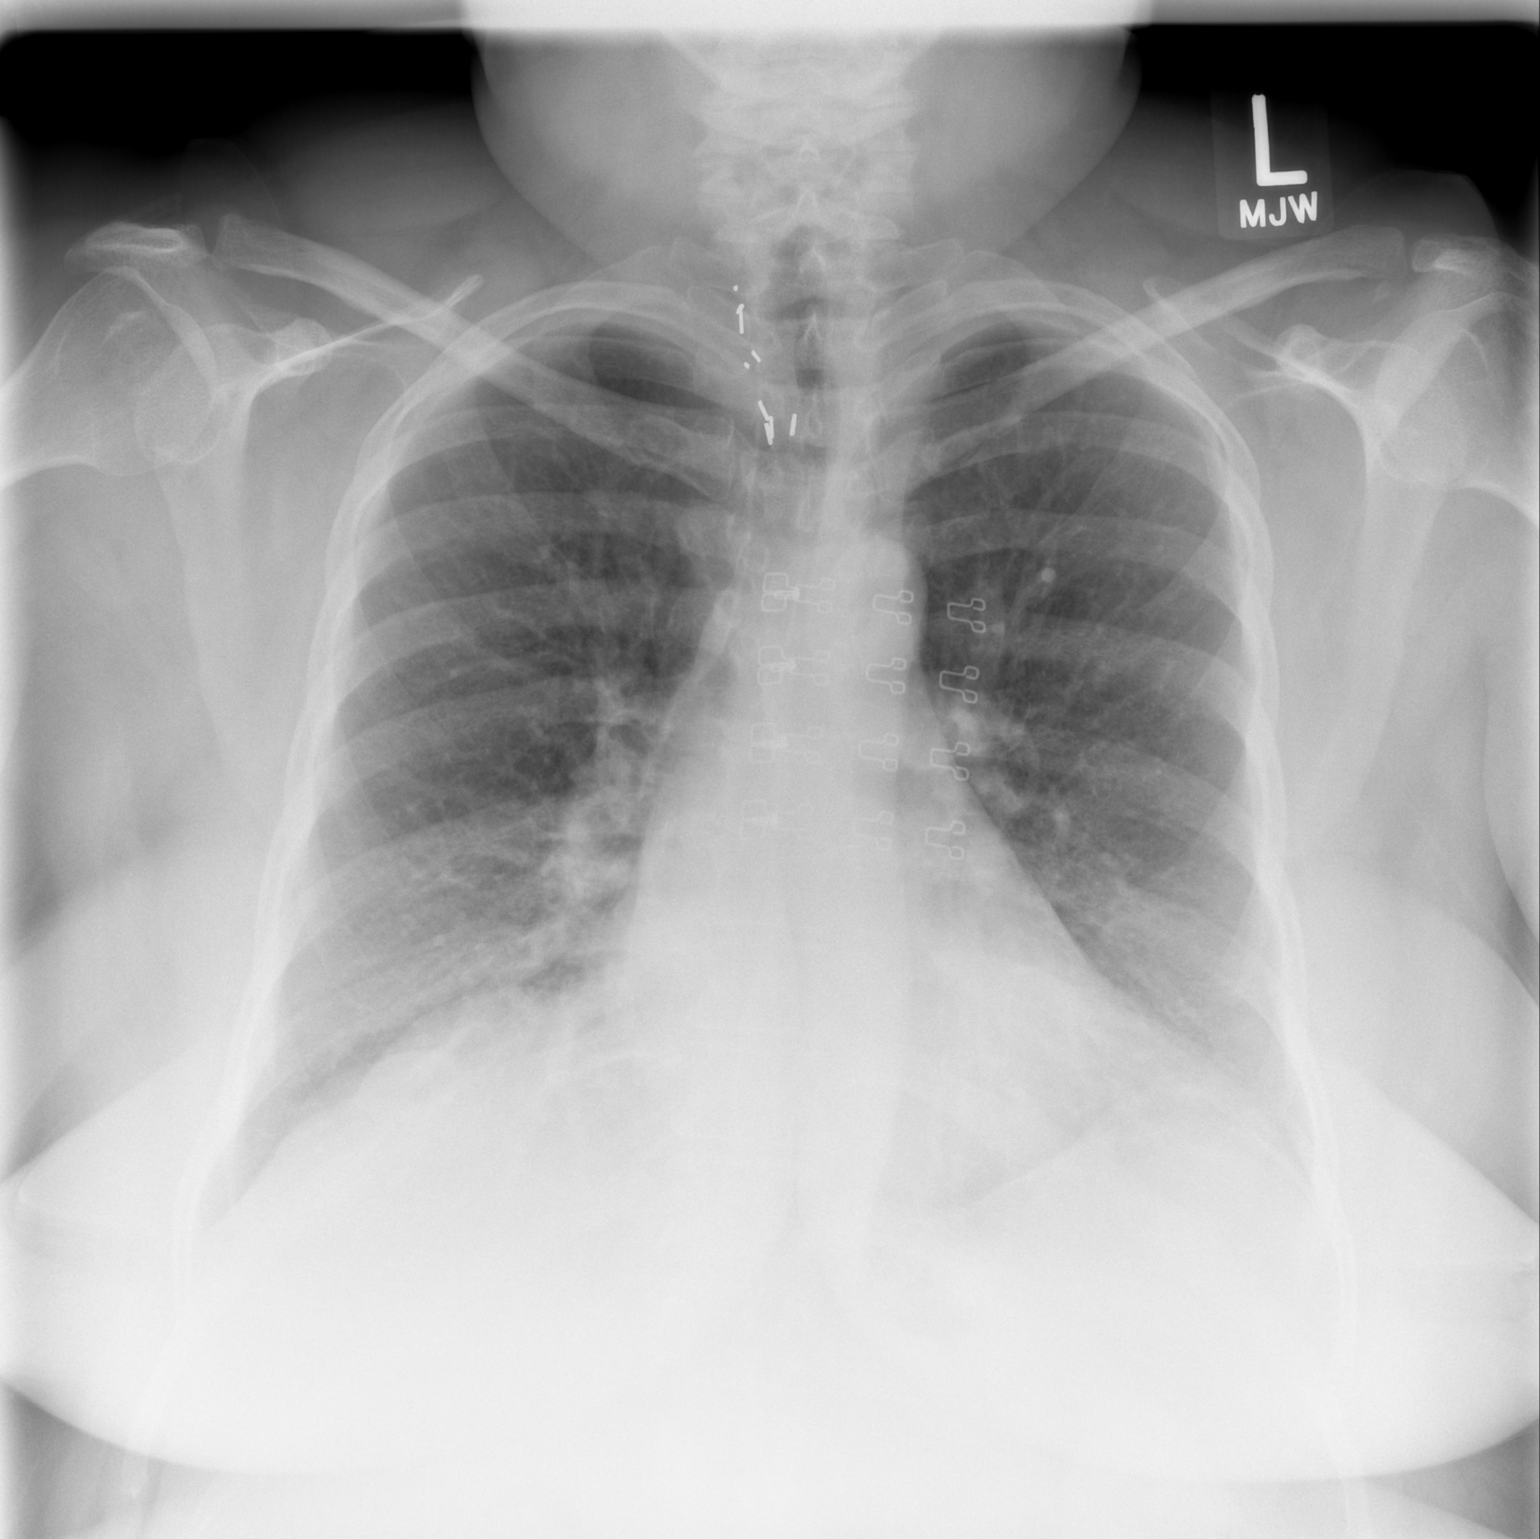

[w chest lat]
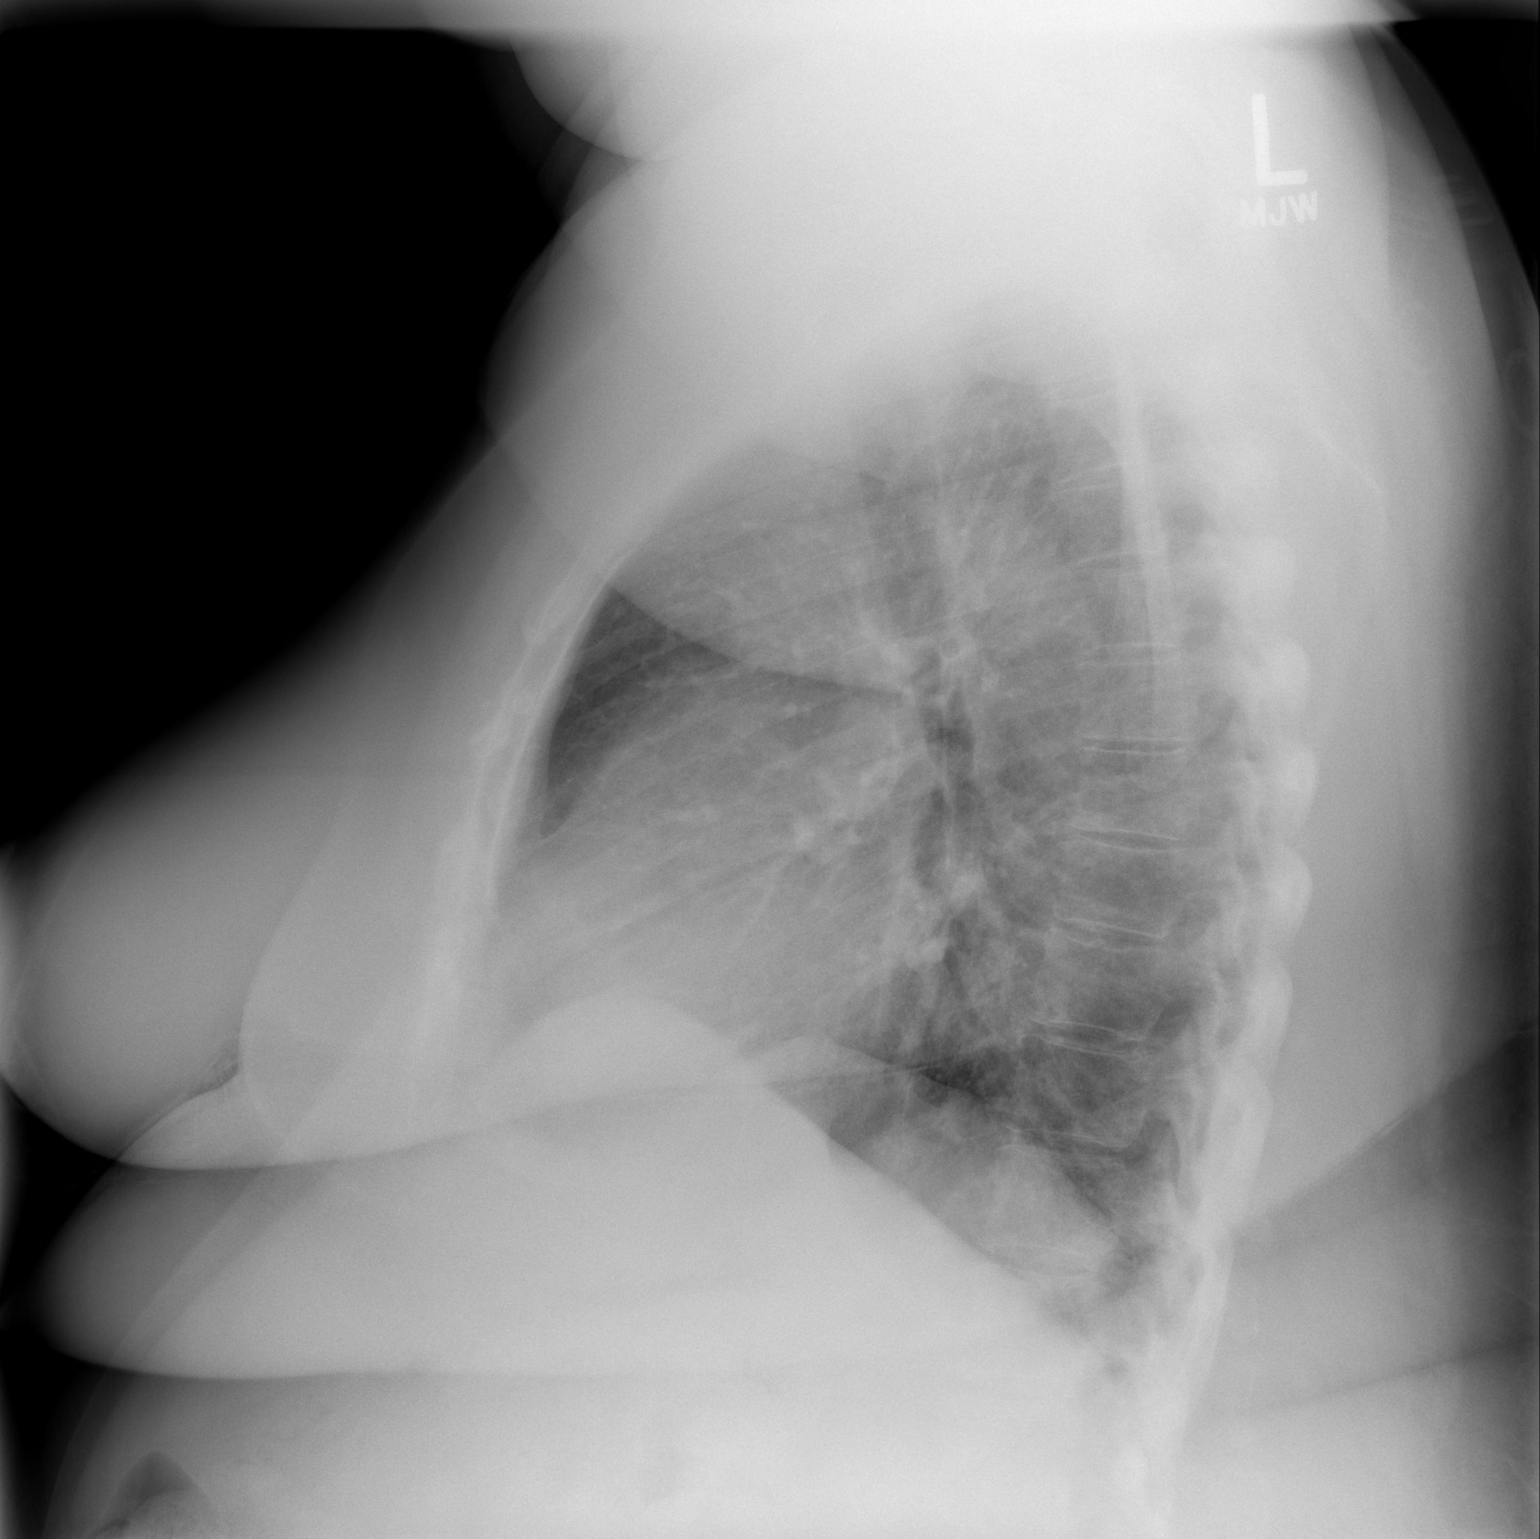

[2 of 2 positions shown; findings below may reference images not displayed]

FINDINGS: And the lungs are clear.  The heart is within upper
limits of normal.  No acute bony abnormality is seen.  Surgical
clips overlie the lower right neck presumably due to prior thyroid
surgery.
IMPRESSION: Stable chest x-ray.  No active lung disease.

## 2010-11-04 ENCOUNTER — Other Ambulatory Visit: Payer: Self-pay | Admitting: Internal Medicine

## 2010-11-05 ENCOUNTER — Other Ambulatory Visit: Payer: Self-pay | Admitting: Internal Medicine

## 2010-11-05 MED ORDER — HYDROCODONE-ACETAMINOPHEN 10-500 MG PO TABS: 1.0000 | ORAL_TABLET | ORAL | Status: DC | PRN

## 2010-11-05 NOTE — Telephone Encounter (Signed)
Lortab rx called to [facility].

## 2010-11-13 LAB — CBC
HCT: 21.2 — ABNORMAL LOW
HCT: 21.2 — ABNORMAL LOW
HCT: 26.4 — ABNORMAL LOW
HCT: 26.5 — ABNORMAL LOW
HCT: 26.6 — ABNORMAL LOW
HCT: 26.8 — ABNORMAL LOW
HCT: 26.9 — ABNORMAL LOW
Hemoglobin: 7.1 — CL
Hemoglobin: 7.2 — CL
Hemoglobin: 8.6 — ABNORMAL LOW
Hemoglobin: 8.8 — ABNORMAL LOW
Hemoglobin: 8.8 — ABNORMAL LOW
Hemoglobin: 8.9 — ABNORMAL LOW
Hemoglobin: 8.9 — ABNORMAL LOW
Hemoglobin: 8.9 — ABNORMAL LOW
Hemoglobin: 8.9 — ABNORMAL LOW
Hemoglobin: 8.9 — ABNORMAL LOW
Hemoglobin: 9.1 — ABNORMAL LOW
MCHC: 32.9
MCHC: 33.2
MCHC: 33.3
MCHC: 33.4
MCHC: 33.5
MCHC: 33.6
MCHC: 33.6
MCHC: 33.6
MCHC: 33.7
MCHC: 33.7
MCHC: 33.8
MCHC: 34.5
MCV: 88.6
MCV: 88.6
MCV: 88.9
MCV: 88.9
MCV: 89.3
MCV: 89.4
MCV: 89.4
MCV: 89.6
MCV: 89.7
Platelets: 142 — ABNORMAL LOW
Platelets: 153
Platelets: 160
Platelets: 238
Platelets: 261
RBC: 2.38 — ABNORMAL LOW
RBC: 2.39 — ABNORMAL LOW
RBC: 2.85 — ABNORMAL LOW
RBC: 2.9 — ABNORMAL LOW
RBC: 2.91 — ABNORMAL LOW
RBC: 2.93 — ABNORMAL LOW
RBC: 2.95 — ABNORMAL LOW
RBC: 3.01 — ABNORMAL LOW
RBC: 3.01 — ABNORMAL LOW
RBC: 3.05 — ABNORMAL LOW
RBC: 3.43 — ABNORMAL LOW
RBC: 4.24
RDW: 14.7
RDW: 15.1
RDW: 15.1
RDW: 15.2
RDW: 15.2
RDW: 15.4
RDW: 15.6 — ABNORMAL HIGH
RDW: 15.6 — ABNORMAL HIGH
WBC: 11 — ABNORMAL HIGH
WBC: 11.6 — ABNORMAL HIGH
WBC: 9.9
WBC: 9.9

## 2010-11-13 LAB — CULTURE, BLOOD (ROUTINE X 2): Culture: NO GROWTH

## 2010-11-13 LAB — COMPREHENSIVE METABOLIC PANEL
ALT: 20
ALT: 23
ALT: 35
AST: 136 — ABNORMAL HIGH
Albumin: 2.4 — ABNORMAL LOW
Albumin: 3.1 — ABNORMAL LOW
Alkaline Phosphatase: 45
Alkaline Phosphatase: 63
BUN: 14
BUN: 24 — ABNORMAL HIGH
CO2: 25
Calcium: 8.2 — ABNORMAL LOW
Calcium: 8.4
Calcium: 8.5
Chloride: 106
Creatinine, Ser: 3.61 — ABNORMAL HIGH
GFR calc Af Amer: 45 — ABNORMAL LOW
GFR calc non Af Amer: 54 — ABNORMAL LOW
Glucose, Bld: 104 — ABNORMAL HIGH
Glucose, Bld: 105 — ABNORMAL HIGH
Glucose, Bld: 125 — ABNORMAL HIGH
Potassium: 4.3
Potassium: 4.6
Sodium: 132 — ABNORMAL LOW
Sodium: 138
Sodium: 139
Total Protein: 4.9 — ABNORMAL LOW
Total Protein: 5.3 — ABNORMAL LOW
Total Protein: 6.1

## 2010-11-13 LAB — BASIC METABOLIC PANEL
BUN: 18
BUN: 19
CO2: 20
CO2: 21
CO2: 21
CO2: 23
CO2: 25
Calcium: 8.1 — ABNORMAL LOW
Calcium: 8.2 — ABNORMAL LOW
Calcium: 8.8
Calcium: 9.6
Chloride: 103
Chloride: 108
Chloride: 110
Creatinine, Ser: 1.39 — ABNORMAL HIGH
Creatinine, Ser: 2.9 — ABNORMAL HIGH
Creatinine, Ser: 3.12 — ABNORMAL HIGH
Creatinine, Ser: 3.22 — ABNORMAL HIGH
Creatinine, Ser: 4.02 — ABNORMAL HIGH
GFR calc Af Amer: 14 — ABNORMAL LOW
GFR calc Af Amer: 18 — ABNORMAL LOW
GFR calc Af Amer: 19 — ABNORMAL LOW
GFR calc Af Amer: 21 — ABNORMAL LOW
GFR calc Af Amer: 48 — ABNORMAL LOW
Glucose, Bld: 118 — ABNORMAL HIGH
Glucose, Bld: 130 — ABNORMAL HIGH
Glucose, Bld: 164 — ABNORMAL HIGH
Glucose, Bld: 99

## 2010-11-13 LAB — PROTIME-INR
INR: 1
INR: 1.1
INR: 1.6 — ABNORMAL HIGH
INR: 1.8 — ABNORMAL HIGH
INR: 1.9 — ABNORMAL HIGH
INR: 2.6 — ABNORMAL HIGH
Prothrombin Time: 13.1
Prothrombin Time: 14.1
Prothrombin Time: 19.1 — ABNORMAL HIGH
Prothrombin Time: 21.3 — ABNORMAL HIGH
Prothrombin Time: 22.7 — ABNORMAL HIGH

## 2010-11-13 LAB — URINE MICROSCOPIC-ADD ON

## 2010-11-13 LAB — DIFFERENTIAL
Basophils Absolute: 0
Basophils Relative: 0
Eosinophils Absolute: 0.1
Monocytes Absolute: 1.2 — ABNORMAL HIGH
Monocytes Relative: 9
Neutro Abs: 11.4 — ABNORMAL HIGH

## 2010-11-13 LAB — HAPTOGLOBIN: Haptoglobin: 414 — ABNORMAL HIGH

## 2010-11-13 LAB — CROSSMATCH: ABO/RH(D): O POS

## 2010-11-13 LAB — URINALYSIS, ROUTINE W REFLEX MICROSCOPIC
Bilirubin Urine: NEGATIVE
Glucose, UA: NEGATIVE
Specific Gravity, Urine: 1.014
pH: 5

## 2010-11-13 LAB — CARBOXYHEMOGLOBIN: Carboxyhemoglobin: 1.5

## 2010-11-13 LAB — POCT I-STAT 3, ART BLOOD GAS (G3+)
Bicarbonate: 18.6 — ABNORMAL LOW
O2 Saturation: 96
TCO2: 20
pCO2 arterial: 35.8
pH, Arterial: 7.323 — ABNORMAL LOW
pO2, Arterial: 89

## 2010-11-14 ENCOUNTER — Ambulatory Visit: Payer: Medicare Other | Admitting: Internal Medicine

## 2010-11-14 LAB — CBC
Hemoglobin: 8.7 — ABNORMAL LOW
MCHC: 33
MCV: 88.9
MCV: 89.1
MCV: 89.2
Platelets: 208
Platelets: 216
Platelets: 234
RBC: 3.01 — ABNORMAL LOW
RDW: 15.1
RDW: 15.6 — ABNORMAL HIGH
RDW: 15.6 — ABNORMAL HIGH
WBC: 12.4 — ABNORMAL HIGH
WBC: 7.9
WBC: 8.2

## 2010-11-14 LAB — BASIC METABOLIC PANEL
BUN: 13
BUN: 21
CO2: 27
Calcium: 8.8
Calcium: 9
Chloride: 102
Chloride: 103
Chloride: 98
Creatinine, Ser: 0.91
Creatinine, Ser: 1.39 — ABNORMAL HIGH
Creatinine, Ser: 1.57 — ABNORMAL HIGH
GFR calc Af Amer: 48 — ABNORMAL LOW
GFR calc Af Amer: >60
GFR calc non Af Amer: 40 — ABNORMAL LOW
GFR calc non Af Amer: >60
Glucose, Bld: 130 — ABNORMAL HIGH
Glucose, Bld: 139 — ABNORMAL HIGH
Sodium: 139

## 2010-11-14 LAB — CROSSMATCH

## 2010-11-14 LAB — PROTIME-INR
INR: 1.2
Prothrombin Time: 13.4
Prothrombin Time: 13.7

## 2010-11-18 ENCOUNTER — Other Ambulatory Visit: Payer: Self-pay | Admitting: *Deleted

## 2010-11-18 MED ORDER — CYCLOBENZAPRINE HCL 10 MG PO TABS: 10.0000 mg | ORAL_TABLET | Freq: Two times a day (BID) | ORAL | Status: DC | PRN

## 2010-11-19 ENCOUNTER — Encounter: Payer: Medicare Other | Admitting: Internal Medicine

## 2011-01-10 ENCOUNTER — Other Ambulatory Visit: Payer: Self-pay | Admitting: Orthopedic Surgery

## 2011-01-10 DIAGNOSIS — M545 Low back pain: Secondary | ICD-10-CM

## 2011-01-14 ENCOUNTER — Ambulatory Visit
Admission: RE | Admit: 2011-01-14 | Discharge: 2011-01-14 | Disposition: A | Discharge status: Home / Self Care | Payer: Medicare Other | Source: Ambulatory Visit | Attending: Orthopedic Surgery | Admitting: Orthopedic Surgery

## 2011-01-14 DIAGNOSIS — M545 Low back pain: Secondary | ICD-10-CM

## 2011-02-04 ENCOUNTER — Other Ambulatory Visit: Payer: Self-pay | Admitting: Family Medicine

## 2011-02-04 ENCOUNTER — Other Ambulatory Visit: Payer: Self-pay | Admitting: Internal Medicine

## 2011-02-04 NOTE — Telephone Encounter (Signed)
Refill request

## 2011-02-17 ENCOUNTER — Ambulatory Visit: Payer: Medicare Other | Admitting: Physical Therapy

## 2011-02-25 ENCOUNTER — Ambulatory Visit: Payer: Medicare Other | Admitting: Physical Therapy

## 2011-02-27 ENCOUNTER — Other Ambulatory Visit: Payer: Self-pay | Admitting: Family Medicine

## 2011-02-27 NOTE — Telephone Encounter (Signed)
Refill request

## 2011-03-04 ENCOUNTER — Ambulatory Visit: Payer: Medicare Other | Admitting: Physical Therapy

## 2011-04-14 ENCOUNTER — Ambulatory Visit: Payer: Medicare Other | Attending: Orthopedic Surgery | Admitting: Physical Therapy

## 2011-04-14 DIAGNOSIS — R5381 Other malaise: Secondary | ICD-10-CM | POA: Insufficient documentation

## 2011-04-14 DIAGNOSIS — M6281 Muscle weakness (generalized): Secondary | ICD-10-CM | POA: Insufficient documentation

## 2011-04-14 DIAGNOSIS — M545 Low back pain, unspecified: Secondary | ICD-10-CM | POA: Insufficient documentation

## 2011-04-14 DIAGNOSIS — R293 Abnormal posture: Secondary | ICD-10-CM | POA: Insufficient documentation

## 2011-04-14 DIAGNOSIS — IMO0001 Reserved for inherently not codable concepts without codable children: Secondary | ICD-10-CM | POA: Insufficient documentation

## 2011-04-22 ENCOUNTER — Ambulatory Visit: Payer: Medicare Other | Admitting: Rehabilitation

## 2011-04-24 ENCOUNTER — Encounter: Payer: No Typology Code available for payment source | Admitting: Physical Therapy

## 2011-04-29 ENCOUNTER — Ambulatory Visit: Payer: Medicare Other | Attending: Orthopedic Surgery | Admitting: Physical Therapy

## 2011-04-29 DIAGNOSIS — R5381 Other malaise: Secondary | ICD-10-CM | POA: Insufficient documentation

## 2011-04-29 DIAGNOSIS — M545 Low back pain, unspecified: Secondary | ICD-10-CM | POA: Insufficient documentation

## 2011-04-29 DIAGNOSIS — R293 Abnormal posture: Secondary | ICD-10-CM | POA: Insufficient documentation

## 2011-04-29 DIAGNOSIS — M6281 Muscle weakness (generalized): Secondary | ICD-10-CM | POA: Insufficient documentation

## 2011-04-29 DIAGNOSIS — IMO0001 Reserved for inherently not codable concepts without codable children: Secondary | ICD-10-CM | POA: Insufficient documentation

## 2011-05-01 ENCOUNTER — Ambulatory Visit: Payer: Medicare Other | Admitting: Rehabilitation

## 2011-05-06 ENCOUNTER — Encounter: Payer: No Typology Code available for payment source | Admitting: Rehabilitation

## 2011-05-08 ENCOUNTER — Encounter: Payer: Medicare Other | Admitting: Rehabilitation

## 2011-05-08 ENCOUNTER — Ambulatory Visit: Payer: Medicare Other | Admitting: Rehabilitation

## 2011-05-09 ENCOUNTER — Ambulatory Visit: Payer: Medicare Other | Admitting: Rehabilitation

## 2011-05-14 ENCOUNTER — Ambulatory Visit: Payer: Medicare Other | Admitting: Rehabilitation

## 2011-05-20 ENCOUNTER — Ambulatory Visit: Payer: Medicare Other | Attending: Orthopedic Surgery | Admitting: Physical Therapy

## 2011-05-20 DIAGNOSIS — M545 Low back pain, unspecified: Secondary | ICD-10-CM | POA: Insufficient documentation

## 2011-05-20 DIAGNOSIS — M6281 Muscle weakness (generalized): Secondary | ICD-10-CM | POA: Insufficient documentation

## 2011-05-20 DIAGNOSIS — R5381 Other malaise: Secondary | ICD-10-CM | POA: Insufficient documentation

## 2011-05-20 DIAGNOSIS — R293 Abnormal posture: Secondary | ICD-10-CM | POA: Insufficient documentation

## 2011-05-20 DIAGNOSIS — IMO0001 Reserved for inherently not codable concepts without codable children: Secondary | ICD-10-CM | POA: Insufficient documentation

## 2011-05-22 ENCOUNTER — Encounter: Payer: No Typology Code available for payment source | Admitting: Rehabilitation

## 2011-05-28 ENCOUNTER — Other Ambulatory Visit: Payer: Self-pay | Admitting: *Deleted

## 2011-05-28 MED ORDER — ALBUTEROL SULFATE HFA 108 (90 BASE) MCG/ACT IN AERS: 2.0000 | INHALATION_SPRAY | Freq: Four times a day (QID) | RESPIRATORY_TRACT | Status: DC | PRN

## 2011-06-03 ENCOUNTER — Encounter (HOSPITAL_COMMUNITY): Payer: Self-pay | Admitting: *Deleted

## 2011-06-03 ENCOUNTER — Encounter (HOSPITAL_COMMUNITY): Payer: Self-pay | Admitting: Emergency Medicine

## 2011-06-03 ENCOUNTER — Emergency Department (HOSPITAL_COMMUNITY): Payer: Medicare Other

## 2011-06-03 ENCOUNTER — Emergency Department (INDEPENDENT_AMBULATORY_CARE_PROVIDER_SITE_OTHER)
Admission: EM | Admit: 2011-06-03 | Discharge: 2011-06-03 | Disposition: A | Discharge status: Nursing Home (Includes ICF) | Payer: Medicare Other | Source: Home / Self Care | Attending: Emergency Medicine | Admitting: Emergency Medicine

## 2011-06-03 ENCOUNTER — Observation Stay (HOSPITAL_COMMUNITY)
Admission: EM | Admit: 2011-06-03 | Discharge: 2011-06-05 | Disposition: A | Discharge status: Home / Self Care | Payer: Medicare Other | Attending: Internal Medicine | Admitting: Internal Medicine

## 2011-06-03 DIAGNOSIS — E039 Hypothyroidism, unspecified: Secondary | ICD-10-CM | POA: Diagnosis present

## 2011-06-03 DIAGNOSIS — R079 Chest pain, unspecified: Secondary | ICD-10-CM | POA: Insufficient documentation

## 2011-06-03 DIAGNOSIS — J4530 Mild persistent asthma, uncomplicated: Secondary | ICD-10-CM | POA: Diagnosis present

## 2011-06-03 DIAGNOSIS — I10 Essential (primary) hypertension: Secondary | ICD-10-CM | POA: Diagnosis present

## 2011-06-03 DIAGNOSIS — F3289 Other specified depressive episodes: Secondary | ICD-10-CM | POA: Insufficient documentation

## 2011-06-03 DIAGNOSIS — F32A Depression, unspecified: Secondary | ICD-10-CM | POA: Diagnosis present

## 2011-06-03 DIAGNOSIS — R42 Dizziness and giddiness: Secondary | ICD-10-CM | POA: Insufficient documentation

## 2011-06-03 DIAGNOSIS — R55 Syncope and collapse: Secondary | ICD-10-CM

## 2011-06-03 DIAGNOSIS — J45909 Unspecified asthma, uncomplicated: Secondary | ICD-10-CM | POA: Insufficient documentation

## 2011-06-03 DIAGNOSIS — Z96659 Presence of unspecified artificial knee joint: Secondary | ICD-10-CM | POA: Insufficient documentation

## 2011-06-03 DIAGNOSIS — E785 Hyperlipidemia, unspecified: Secondary | ICD-10-CM | POA: Diagnosis present

## 2011-06-03 DIAGNOSIS — G4730 Sleep apnea, unspecified: Secondary | ICD-10-CM | POA: Insufficient documentation

## 2011-06-03 DIAGNOSIS — F329 Major depressive disorder, single episode, unspecified: Secondary | ICD-10-CM | POA: Insufficient documentation

## 2011-06-03 DIAGNOSIS — Z9181 History of falling: Secondary | ICD-10-CM | POA: Insufficient documentation

## 2011-06-03 HISTORY — DX: Unspecified osteoarthritis, unspecified site: M19.90

## 2011-06-03 LAB — CBC
Hemoglobin: 12 g/dL (ref 12.0–15.0)
MCHC: 32.3 g/dL (ref 30.0–36.0)
RDW: 15.3 % (ref 11.5–15.5)
WBC: 7.6 10*3/uL (ref 4.0–10.5)

## 2011-06-03 LAB — BASIC METABOLIC PANEL
Chloride: 101 mEq/L (ref 96–112)
GFR calc Af Amer: 71 mL/min — ABNORMAL LOW (ref >90)
Potassium: 4 mEq/L (ref 3.5–5.1)

## 2011-06-03 LAB — GLUCOSE, CAPILLARY

## 2011-06-03 LAB — DIFFERENTIAL
Basophils Absolute: 0.1 10*3/uL (ref 0.0–0.1)
Basophils Relative: 1 % (ref 0–1)
Monocytes Relative: 5 % (ref 3–12)
Neutro Abs: 4.3 10*3/uL (ref 1.7–7.7)
Neutrophils Relative %: 56 % (ref 43–77)

## 2011-06-03 NOTE — ED Provider Notes (Signed)
History     CSN: [id number]  Arrival date & time 06/03/11  1636   First MD Initiated Contact with Patient 06/03/11 1734      Chief Complaint  Patient presents with  . Fall    (Consider location/radiation/quality/duration/timing/severity/associated sxs/prior treatment) HPI Comments: Patient reports "blacking out" without warning around 10:30 this morning. States she woke up on the floor. Denies any palpitations, chest pain, diaphoresis, tunnel vision preceding fall. Patient now complains of posterior headache and left shoulder pain. No neck, back pain. Patient is able to move her shoulder, although she states it hurts. No nausea, vomiting. No visual changes. No increased shortness of breath above her baseline dyspnea. No abdominal pain, other extremity pain. No dysarthria, weakness in arm or leg, discoordination. Patient states that she's not been eating and drinking as much as she normally does recently, but does not offer any explanation for this. Denies any change in medications. Denies use of alcohol or illegal drugs.  ROS as noted in HPI. All other ROS negative.   Patient is a 56 y.o. female presenting with fall. The history is provided by the patient.  Fall The accident occurred 6 to 12 hours ago. The fall occurred while walking. She fell from a height of 3 to 5 ft. She landed on a hard floor. There was no blood loss. The point of impact was the head and left shoulder. The pain is present in the head and left shoulder. She was ambulatory at the scene. There was no drug use involved in the accident. There was no alcohol use involved in the accident. Associated symptoms include headaches and loss of consciousness. Pertinent negatives include no visual change, no fever, no numbness, no abdominal pain, no bowel incontinence, no nausea, no vomiting and no tingling. She has tried nothing for the symptoms.    Past Medical History  Diagnosis Date  . Depression   . Hypertension   . Thyroid  disease   . GERD (gastroesophageal reflux disease)   . Pancreatitis     hx of  . Knee pain, bilateral   . Obesity   . HSV (herpes simplex virus) infection   . Hyperlipemia   . Asthma   . Tachycardia     with sx in 2008  . Breast mass in female     L breast 2008, US showed likely fat necrosis  . Sleep apnea   . Arthritis     Past Surgical History  Procedure Date  . Replacement total knee bilateral   . Foot surgery   . Abdominal hysterectomy   . Abdominal surgery   . Cholecystectomy   . Breast surgery   . Joint replacement   . Thyroid surgery     Family History  Problem Relation Age of Onset  . Heart disease Brother   . Diabetes Brother   . Hypertension Mother   . Diabetes Mother   . Cancer Mother   . Hypertension Sister   . Diabetes Sister   . Cancer Sister     History  Substance Use Topics  . Smoking status: Former Smoker -- 1.0 packs/day for 10 years    Types: Cigarettes    Quit date: 03/24/1992  . Smokeless tobacco: Never Used  . Alcohol Use: No    OB History    Grav Para Term Preterm Abortions TAB SAB Ect Mult Living                  Review of Systems  Constitutional: Negative  for fever.  Gastrointestinal: Negative for nausea, vomiting, abdominal pain and bowel incontinence.  Neurological: Positive for loss of consciousness and headaches. Negative for tingling and numbness.    Allergies  Shellfish allergy; Morphine and related; and Oxycodone hcl  Home Medications   Current Outpatient Rx  Name Route Sig Dispense Refill  . AMLODIPINE BESYLATE 10 MG PO TABS  TAKE ONE TABLET BY MOUTH EVERY DAY TO REDUCE BLOOD PRESSURE 10 tablet 11  . ARIPIPRAZOLE 2 MG PO TABS Oral Take 1 tablet (2 mg total) by mouth daily. 30 tablet 3  . BENZONATATE 100 MG PO CAPS Oral Take 100 mg by mouth. 4 - 6 times a day as needed for cough     . BUDESONIDE-FORMOTEROL FUMARATE 80-4.5 MCG/ACT IN AERO Inhalation Inhale 1 puff into the lungs 2 (two) times daily. 1 Inhaler 3  .  BUPROPION HCL ER (SR) 150 MG PO TB12 Oral Take 3 tablets (450 mg total) by mouth daily. 90 tablet 3  . CYCLOBENZAPRINE HCL 10 MG PO TABS Oral Take 1 tablet (10 mg total) by mouth 2 (two) times daily as needed for muscle spasms. 30 tablet 5  . DICLOFENAC SODIUM 1 % TD GEL Topical Apply 1 application topically 3 (three) times daily as needed.      [name] ESOMEPRAZOLE MAGNESIUM 40 MG PO CPDR Oral Take 1 capsule (40 mg total) by mouth daily. 30 capsule 3  . FUROSEMIDE 40 MG PO TABS Oral Take 1 tablet (40 mg total) by mouth daily. 30 tablet 3  . GABAPENTIN 300 MG PO CAPS Oral Take 300 mg by mouth 3 (three) times daily.      [name] LEVOTHYROXINE SODIUM 175 MCG PO TABS Oral Take 1 tablet (175 mcg total) by mouth daily. 30 tablet 3  . LISINOPRIL 20 MG PO TABS Oral Take 20 mg by mouth daily.      [name] LORAZEPAM 1 MG PO TABS Oral Take 1 tablet (1 mg total) by mouth 3 (three) times daily as needed. 90 tablet 3  . MELOXICAM 15 MG PO TABS  TAKE ONE TABLET BY MOUTH EVERY DAY 30 tablet 4  . PRAVASTATIN SODIUM 80 MG PO TABS Oral Take 1 tablet (80 mg total) by mouth at bedtime. 30 tablet 3  . QUETIAPINE FUMARATE 300 MG PO TABS Oral Take 3 tablets (900 mg total) by mouth at bedtime. 90 tablet 3  . TRAZODONE HCL 100 MG PO TABS Oral Take 1 tablet (100 mg total) by mouth at bedtime. 30 tablet 3  . VALACYCLOVIR HCL 1 G PO TABS  TAKE ONE TABLET BY MOUTH EVERY DAY 30 tablet 3  . ZOLPIDEM TARTRATE 10 MG PO TABS Oral Take 1 tablet (10 mg total) by mouth at bedtime as needed for sleep. 30 tablet 3  . ALBUTEROL SULFATE HFA 108 (90 BASE) MCG/ACT IN AERS Inhalation Inhale 2 puffs into the lungs every 6 (six) hours as needed. For shortness of breath 18 g 3  . GUAIFENESIN-CODEINE 100-10 MG/5ML PO SYRP Oral Take 5-10 mLs by mouth every 4 (four) hours as needed. Or every 6 hours as needed for cough     . HYDROCODONE-ACETAMINOPHEN 10-500 MG PO TABS Oral Take 1 tablet by mouth every 4 (four) hours as needed for pain. 120 tablet 0  . LACTULOSE  SOLN Oral Take 45 mLs by mouth 3 (three) times daily. If you develop diarrhea, cut to twice daily.     . MOMETASONE FUROATE 50 MCG/ACT NA SUSP  2 sprays daily. In  each nostril     . PAROXETINE HCL 20 MG PO TABS Oral Take 1 tablet (20 mg total) by mouth daily. 30 tablet 3    BP 124/81  Pulse 100  Temp(Src) 97.6 F (36.4 C) (Oral)  Resp 20  SpO2 95%  Physical Exam  Nursing note and vitals reviewed. Constitutional: She is oriented to person, place, and time. She appears well-developed and well-nourished.       Morbidly obese  HENT:  Head: Normocephalic.  Nose: Nose normal.       Soft tissue tenderness left occiput. No bruising. Skin intact  Eyes: Conjunctivae and EOM are normal. Pupils are equal, round, and reactive to light.  Neck: Normal range of motion. Neck supple.       No C-spine tenderness. Patient able to actively rotate head to left and right 45.  Cardiovascular: Normal rate, regular rhythm, normal heart sounds and intact distal pulses.   No murmur heard. Pulmonary/Chest: Effort normal and breath sounds normal. She exhibits tenderness.       Left-sided posterior chest wall tenderness. No rash, bruising, crepitus.  Abdominal: Soft. Bowel sounds are normal. She exhibits no distension. There is no tenderness. There is no rebound and no guarding.  Musculoskeletal: Normal range of motion. She exhibits no edema and no tenderness.       Left shoulder: She exhibits tenderness. She exhibits normal range of motion.       Tenderness lateral left shoulder. Patient able to move shoulder through full AROM.  No bony tenderness lung clavicle, upper arm, elbow, forearm, wrist, hand. No evidence of dislocation. RP 2+.  Neurological: She is alert and oriented to person, place, and time. No cranial nerve deficit. She exhibits normal muscle tone. Coordination normal.        Strength Biceps/triceps, hip extension, knee extension 5/5 grip equal. speech fluent.  Skin: Skin is warm and dry.    Psychiatric: She has a normal mood and affect. Her behavior is normal. Judgment and thought content normal.    ED Course  Procedures (including critical care time)  Labs Reviewed - No data to display No results found.   1. Syncope    EKG: Normal sinus rhythm, rate 94. Normal axis, normal intervals. No hypertrophy. Low voltage, likely due to patient habitus. No ST T-wave changes compared to EKG from February 2010.  MDM  Previous records reviewed. Additional medical history obtained.  Pt with multiple comorbidities including hypertension, morbid obesity, polypharmacy, presents for syncope without warning earlier today. Patient is neurologically intact. EKG shows no ischemic changes. Transferring to the ED for syncope workup.    Cherly Beach, MD 06/03/11 [phone]

## 2011-06-03 NOTE — ED Notes (Signed)
Patient states she fell this am, around 1030.  Patient states she was walking to get herself to the kitchen and "saw black and woke up from hitting my head on the wall".  Patient states she has head pain and has dizziness.  Patient states her dizziness has been going on for 2 months.  Patient is slow to respond, some mild slurred speech which is normal for her.  Patient denies stroke history.  Patient is CAOx3, appropriate.

## 2011-06-03 NOTE — ED Provider Notes (Signed)
Patient was screened by me. Patient has a syncopal episode this a.m. No precipitating factors. She was seen and evaluated at the urgent care and sent here with fall and syncopal workup. Preliminary lab work ordered. Will move patient to main ED  Domenic Moras, PA-C 06/03/11 2239

## 2011-06-03 NOTE — ED Notes (Addendum)
Pt was at home today about 1030 got dizzy and fell backward, hitting her head.  She does remember hitting the door frame, but thinks she might have blacked out momentarily before that.    At present c/o occipital headache, left  shoulder pain and slight dizziness.  Pt in w/c for safety.   Pt usually  uses a cane since she had bilateral knee replacements

## 2011-06-03 NOTE — ED Notes (Signed)
Patient transported to X-ray 

## 2011-06-03 NOTE — ED Notes (Signed)
MD at bedside. Dr. Wyvonnia Dusky

## 2011-06-03 NOTE — ED Notes (Signed)
Patient transported to CT 

## 2011-06-03 NOTE — ED Notes (Signed)
Pt from [facility] c/o dizziness today upon waking with fall and hitting head; pt alert at present with slurred speech per norm per family; pt sts some dizziness yesterday but worse today; pt sts hx of chronic pain

## 2011-06-03 NOTE — ED Provider Notes (Signed)
History     CSN: [id number]  Arrival date & time 06/03/11  [name]   First MD Initiated Contact with Patient 06/03/11 2235      Chief Complaint  Patient presents with  . Fall    (Consider location/radiation/quality/duration/timing/severity/associated sxs/prior treatment) HPI Comments: Patient presents after syncopal episode around 10:30 this morning. She states she felt the room was spinning when she got up so she laid back down for a few minutes. She then felt better and got up and took 2 steps out of her bedroom felt lightheaded and dizzy with the room spinning and then fell hitting her head in the back of the floor.  He denies any chest pain, vision changes, nausea, vomiting, vomiting or diarrhea. She has no change in her baseline shortness of breath. No history of heart disease.  The history is provided by the patient.    Past Medical History  Diagnosis Date  . Depression   . Hypertension   . Thyroid disease   . GERD (gastroesophageal reflux disease)   . Pancreatitis     hx of  . Knee pain, bilateral   . Obesity   . HSV (herpes simplex virus) infection   . Hyperlipemia   . Asthma   . Tachycardia     with sx in 2008  . Breast mass in female     L breast 2008, US showed likely fat necrosis  . Sleep apnea   . Arthritis     Past Surgical History  Procedure Date  . Replacement total knee bilateral   . Foot surgery   . Abdominal hysterectomy   . Abdominal surgery   . Cholecystectomy   . Breast surgery   . Joint replacement   . Thyroid surgery     Family History  Problem Relation Age of Onset  . Heart disease Brother   . Diabetes Brother   . Hypertension Mother   . Diabetes Mother   . Cancer Mother   . Hypertension Sister   . Diabetes Sister   . Cancer Sister     History  Substance Use Topics  . Smoking status: Former Smoker -- 1.0 packs/day for 10 years    Types: Cigarettes    Quit date: 03/24/1992  . Smokeless tobacco: Never Used  . Alcohol Use: No      OB History    Grav Para Term Preterm Abortions TAB SAB Ect Mult Living                  Review of Systems  Constitutional: Positive for activity change and appetite change. Negative for fever.  HENT: Negative for congestion and rhinorrhea.   Eyes: Negative for visual disturbance.  Respiratory: Positive for shortness of breath. Negative for cough and chest tightness.   Cardiovascular: Negative for chest pain.  Gastrointestinal: Negative for nausea, vomiting and abdominal pain.  Genitourinary: Negative for dysuria and hematuria.  Musculoskeletal: Negative for back pain.  Skin: Negative for rash.  Neurological: Positive for dizziness, syncope, light-headedness and headaches. Negative for speech difficulty, weakness and numbness.    Allergies  Shellfish allergy; Morphine and related; and Oxycodone hcl  Home Medications   No current outpatient prescriptions on file.  BP 122/83  Pulse 80  Temp(Src) 98.9 F (37.2 C) (Oral)  Resp 20  Ht _0  (1.702 m)  Wt 344 lb (156.037 kg)  BMI 53.88 kg/m2  SpO2 97%  Physical Exam  Constitutional: She is oriented to person, place, and time. She appears well-developed and  well-nourished. No distress.  HENT:  Head: Normocephalic and atraumatic.  Mouth/Throat: Oropharynx is clear and moist. No oropharyngeal exudate.       Cerumen impaction bilaterally  Eyes: Conjunctivae and EOM are normal. Pupils are equal, round, and reactive to light.  Neck: Normal range of motion.       No C-spine pain, step-off or deformity  Cardiovascular: Normal rate, regular rhythm and normal heart sounds.   No murmur heard. Pulmonary/Chest: Effort normal and breath sounds normal. No respiratory distress.  Abdominal: Soft. There is no tenderness. There is no rebound and no guarding.  Musculoskeletal: Normal range of motion. She exhibits no edema and no tenderness.  Neurological: She is alert and oriented to person, place, and time. No cranial nerve deficit.  Coordination normal.       5 out of 5 strength throughout, cranial nerves II through XII intact, equal grip strengths, no nystagmus. Dizziness there is. No catch up saccades on head impulse testing.  No ataxia on finger to nose  Skin: Skin is warm.    ED Course  Procedures (including critical care time)  Labs Reviewed  BASIC METABOLIC PANEL - Abnormal; Notable for the following:    Glucose, Bld 127 (*)    GFR calc non Af Amer 61 (*)    GFR calc Af Amer 71 (*)    All other components within normal limits  URINALYSIS, ROUTINE W REFLEX MICROSCOPIC - Abnormal; Notable for the following:    APPearance CLOUDY (*)    Specific Gravity, Urine >1.046 (*)    All other components within normal limits  GLUCOSE, CAPILLARY - Abnormal; Notable for the following:    Glucose-Capillary 128 (*)    All other components within normal limits  CBC  DIFFERENTIAL  POCT I-STAT TROPONIN I  CBC  BASIC METABOLIC PANEL  TSH  CARDIAC PANEL(CRET KIN+CKTOT+MB+TROPI)  CARDIAC PANEL(CRET KIN+CKTOT+MB+TROPI)  CARDIAC PANEL(CRET KIN+CKTOT+MB+TROPI)   Dg Chest 2 View  06/04/2011  *RADIOLOGY REPORT*  Clinical Data: Shortness of breath.  Syncope.  CHEST - 2 VIEW  Comparison: 03/17/2011and 07/19/2007  Findings: Surgical clips in the right thoracic inlet.  Borderline cardiomegaly is stable.  Chronic interstitial prominence is stable. No focal airspace opacities, pleural effusion, or pneumothorax is identified.  IMPRESSION: Stable chest radiograph.  Chronic interstitial prominence and borderline cardiomegaly.  No acute findings identified.  Original Report Authenticated By: Curlene Dolphin, M.D.   Ct Head Wo Contrast  06/03/2011  *RADIOLOGY REPORT*  Clinical Data: Dizziness, weakness; status post syncope and fall. Hit left side of head.  CT HEAD WITHOUT CONTRAST  Technique:  Contiguous axial images were obtained from the base of the skull through the vertex without contrast.  Comparison: None.  Findings: There is no evidence  of acute infarction, mass lesion, or intra- or extra-axial hemorrhage on CT.  The posterior fossa, including the cerebellum, brainstem and fourth ventricle, is within normal limits.  The third and lateral ventricles, and basal ganglia are unremarkable in appearance.  The cerebral hemispheres are symmetric in appearance, with normal gray- white differentiation.  No mass effect or midline shift is seen.  There is no evidence of fracture; visualized osseous structures are unremarkable in appearance.  The visualized portions of the orbits are within normal limits.  The paranasal sinuses and mastoid air cells are well-aerated.  No significant soft tissue abnormalities are seen.  A large amount of cerumen is noted filling the right external auditory canal, and a small amount of cerumen is noted within the left external  auditory canal.  IMPRESSION:  1.  No evidence of traumatic intracranial injury or fracture. 2.  Large amount of cerumen noted filling the right external auditory canal, and a small amount of cerumen within the left external auditory canal.  Original Report Authenticated By: Santa Lighter, M.D.   Ct Angio Chest W/cm &/or Wo Cm  06/04/2011  *RADIOLOGY REPORT*  Clinical Data: Chest pain and shortness of breath; dizziness. Status post fall.  CT ANGIOGRAPHY CHEST  Technique:  Multidetector CT imaging of the chest using the standard protocol during bolus administration of intravenous contrast. Multiplanar reconstructed images including MIPs were obtained and reviewed to evaluate the vascular anatomy.  Contrast: 172m OMNIPAQUE IOHEXOL 350 MG/ML SOLN  Comparison: Chest radiograph performed 06/03/2011, and V/Q scan performed 07/11/2007  Findings: There is no evidence of significant pulmonary embolus. Evaluation for pulmonary embolus is suboptimal due to motion artifact.  Trace bilateral pleural effusions are noted, with mild bilateral dependent subsegmental atelectasis.  No definite pulmonary edema is seen.   There is no evidence of significant focal consolidation or pneumothorax.  No masses are identified; no abnormal focal contrast enhancement is seen.  The mediastinum is unremarkable in appearance.  No mediastinal lymphadenopathy is seen.  No pericardial effusion identified.  The great vessels are unremarkable in appearance.  Postoperative change is noted about the thyroid bed; there is a 2.0 cm cystic focus arising inferior to the left thyroid lobe.  A tiny hiatal hernia is seen.  No axillary lymphadenopathy is seen.  The visualized portions of the liver and spleen are unremarkable.  No acute osseous abnormalities are seen.  IMPRESSION:  1.  No evidence of significant pulmonary embolus. 2.  Trace bilateral pleural effusions, with mild bilateral dependent subsegmental atelectasis.  No definite pulmonary edema seen. 3.  Tiny hiatal hernia noted. 4.  2.0 cm cystic lesion arising inferior to the left thyroid lobe; postoperative change noted about the right thyroid bed.  Consider further evaluation with thyroid ultrasound.  If patient is clinically hyperthyroid, consider nuclear medicine thyroid uptake and scan.  Original Report Authenticated By: JSanta Lighter M.D.     1. Syncope       MDM  Stable episode today preceded by vertigo or lightheadedness. No chest pain, nausea, vomiting, clammy hands. Shortness of breath at baseline. Decreased by mouth intake.  Orthostatics negative. EKG nonischemic, troponin negative. Given patient's obesity, shortness of breath and syncope CT angiogram to evaluate for PE.   Date: 06/03/2011  Rate: 94  Rhythm: normal sinus rhythm  QRS Axis: normal  Intervals: normal  ST/T Wave abnormalities: normal  Conduction Disutrbances:none  Narrative Interpretation:   Old EKG Reviewed: unchanged         SEzequiel Essex MD 06/04/11 [phone]

## 2011-06-03 NOTE — ED Notes (Signed)
Called report to [place], Therapist, sports.  Pt being transferred by shuttle.

## 2011-06-03 NOTE — ED Notes (Signed)
Yellow fall risk bracelet applied

## 2011-06-04 ENCOUNTER — Emergency Department (HOSPITAL_COMMUNITY): Payer: Medicare Other

## 2011-06-04 ENCOUNTER — Encounter (HOSPITAL_COMMUNITY): Payer: Self-pay | Admitting: Internal Medicine

## 2011-06-04 ENCOUNTER — Other Ambulatory Visit: Payer: Self-pay

## 2011-06-04 DIAGNOSIS — R55 Syncope and collapse: Secondary | ICD-10-CM

## 2011-06-04 LAB — BASIC METABOLIC PANEL
BUN: 16 mg/dL (ref 6–23)
Chloride: 101 mEq/L (ref 96–112)
GFR calc Af Amer: 70 mL/min — ABNORMAL LOW (ref >90)
GFR calc non Af Amer: 60 mL/min — ABNORMAL LOW (ref >90)
Glucose, Bld: 135 mg/dL — ABNORMAL HIGH (ref 70–99)
Potassium: 3.7 mEq/L (ref 3.5–5.1)
Sodium: 138 mEq/L (ref 135–145)

## 2011-06-04 LAB — CBC
HCT: 35.6 % — ABNORMAL LOW (ref 36.0–46.0)
Hemoglobin: 11.6 g/dL — ABNORMAL LOW (ref 12.0–15.0)
MCHC: 32.6 g/dL (ref 30.0–36.0)
RDW: 15.2 % (ref 11.5–15.5)
WBC: 7.2 10*3/uL (ref 4.0–10.5)

## 2011-06-04 LAB — CARDIAC PANEL(CRET KIN+CKTOT+MB+TROPI)
CK, MB: 1.9 ng/mL (ref 0.3–4.0)
CK, MB: 1.9 ng/mL (ref 0.3–4.0)
Relative Index: 1.1 (ref 0.0–2.5)
Total CK: 159 U/L (ref 7–177)
Total CK: 175 U/L (ref 7–177)
Troponin I: <0.3 ng/mL (ref <0.30)

## 2011-06-04 LAB — URINALYSIS, ROUTINE W REFLEX MICROSCOPIC
Leukocytes, UA: NEGATIVE
Nitrite: NEGATIVE
Specific Gravity, Urine: >1.046 — ABNORMAL HIGH (ref 1.005–1.030)
pH: 5.5 (ref 5.0–8.0)

## 2011-06-04 MED ORDER — ONDANSETRON HCL 4 MG PO TABS: 4.0000 mg | ORAL_TABLET | Freq: Four times a day (QID) | ORAL | Status: DC | PRN

## 2011-06-04 MED ORDER — SODIUM CHLORIDE 0.9 % IJ SOLN: 3.0000 mL | Freq: Two times a day (BID) | INTRAMUSCULAR | Status: DC

## 2011-06-04 MED ORDER — QUETIAPINE FUMARATE 300 MG PO TABS
900.0000 mg | ORAL_TABLET | Freq: Every day | ORAL | Status: DC
  Administered 2011-06-04: 900 mg via ORAL
  Filled 2011-06-04 (×3): qty 3

## 2011-06-04 MED ORDER — ATORVASTATIN CALCIUM 20 MG PO TABS
20.0000 mg | ORAL_TABLET | Freq: Every day | ORAL | Status: DC
  Administered 2011-06-04: 20 mg via ORAL
  Filled 2011-06-04 (×2): qty 1

## 2011-06-04 MED ORDER — AMLODIPINE BESYLATE 10 MG PO TABS
10.0000 mg | ORAL_TABLET | Freq: Every day | ORAL | Status: DC
  Filled 2011-06-04: qty 1

## 2011-06-04 MED ORDER — IOHEXOL 350 MG/ML SOLN
100.0000 mL | Freq: Once | INTRAVENOUS | Status: AC | PRN
  Administered 2011-06-04: 100 mL via INTRAVENOUS

## 2011-06-04 MED ORDER — TRAZODONE HCL 100 MG PO TABS
100.0000 mg | ORAL_TABLET | Freq: Every day | ORAL | Status: DC
  Administered 2011-06-04: 100 mg via ORAL
  Filled 2011-06-04 (×2): qty 1

## 2011-06-04 MED ORDER — SIMVASTATIN 40 MG PO TABS: 40.0000 mg | ORAL_TABLET | Freq: Every day | ORAL | Status: DC

## 2011-06-04 MED ORDER — ARIPIPRAZOLE 2 MG PO TABS
2.0000 mg | ORAL_TABLET | Freq: Every day | ORAL | Status: DC
  Administered 2011-06-04 – 2011-06-05 (×2): 2 mg via ORAL
  Filled 2011-06-04 (×2): qty 1

## 2011-06-04 MED ORDER — PANTOPRAZOLE SODIUM 40 MG PO TBEC
80.0000 mg | DELAYED_RELEASE_TABLET | Freq: Every day | ORAL | Status: DC
  Administered 2011-06-04 – 2011-06-05 (×2): 80 mg via ORAL
  Filled 2011-06-04 (×3): qty 1

## 2011-06-04 MED ORDER — GABAPENTIN 300 MG PO CAPS
300.0000 mg | ORAL_CAPSULE | Freq: Three times a day (TID) | ORAL | Status: DC
  Administered 2011-06-04 – 2011-06-05 (×5): 300 mg via ORAL
  Filled 2011-06-04 (×6): qty 1

## 2011-06-04 MED ORDER — ALBUTEROL SULFATE HFA 108 (90 BASE) MCG/ACT IN AERS
2.0000 | INHALATION_SPRAY | Freq: Four times a day (QID) | RESPIRATORY_TRACT | Status: DC | PRN
  Filled 2011-06-04: qty 6.7

## 2011-06-04 MED ORDER — ACETAMINOPHEN 325 MG PO TABS
650.0000 mg | ORAL_TABLET | ORAL | Status: DC | PRN
  Administered 2011-06-04: 650 mg via ORAL
  Filled 2011-06-04: qty 2

## 2011-06-04 MED ORDER — SODIUM CHLORIDE 0.9 % IV SOLN
INTRAVENOUS | Status: DC
  Administered 2011-06-04 (×2): via INTRAVENOUS

## 2011-06-04 MED ORDER — ENOXAPARIN SODIUM 40 MG/0.4ML ~~LOC~~ SOLN
40.0000 mg | SUBCUTANEOUS | Status: DC
  Administered 2011-06-04: 40 mg via SUBCUTANEOUS
  Filled 2011-06-04: qty 0.4

## 2011-06-04 MED ORDER — PAROXETINE HCL 20 MG PO TABS
20.0000 mg | ORAL_TABLET | Freq: Every day | ORAL | Status: DC
  Administered 2011-06-04 – 2011-06-05 (×2): 20 mg via ORAL
  Filled 2011-06-04 (×2): qty 1

## 2011-06-04 MED ORDER — BUDESONIDE-FORMOTEROL FUMARATE 80-4.5 MCG/ACT IN AERO
2.0000 | INHALATION_SPRAY | Freq: Two times a day (BID) | RESPIRATORY_TRACT | Status: DC
  Filled 2011-06-04: qty 6.9

## 2011-06-04 MED ORDER — SODIUM CHLORIDE 0.9 % IJ SOLN: 3.0000 mL | INTRAMUSCULAR | Status: DC | PRN

## 2011-06-04 MED ORDER — ENOXAPARIN SODIUM 100 MG/ML ~~LOC~~ SOLN
75.0000 mg | SUBCUTANEOUS | Status: DC
  Administered 2011-06-05: 75 mg via SUBCUTANEOUS
  Filled 2011-06-04: qty 1

## 2011-06-04 MED ORDER — ONDANSETRON HCL 4 MG/2ML IJ SOLN: 4.0000 mg | Freq: Four times a day (QID) | INTRAMUSCULAR | Status: DC | PRN

## 2011-06-04 MED ORDER — LEVOTHYROXINE SODIUM 175 MCG PO TABS
175.0000 ug | ORAL_TABLET | Freq: Every day | ORAL | Status: DC
  Administered 2011-06-04 – 2011-06-05 (×2): 175 ug via ORAL
  Filled 2011-06-04 (×3): qty 1

## 2011-06-04 MED ORDER — VALACYCLOVIR HCL 500 MG PO TABS
1000.0000 mg | ORAL_TABLET | Freq: Every day | ORAL | Status: DC
  Administered 2011-06-04 – 2011-06-05 (×2): 1000 mg via ORAL
  Filled 2011-06-04 (×2): qty 2

## 2011-06-04 MED ORDER — SODIUM CHLORIDE 0.9 % IV SOLN: 250.0000 mL | INTRAVENOUS | Status: DC | PRN

## 2011-06-04 MED ORDER — MECLIZINE HCL 12.5 MG PO TABS
12.5000 mg | ORAL_TABLET | Freq: Two times a day (BID) | ORAL | Status: DC
  Administered 2011-06-04 – 2011-06-05 (×3): 12.5 mg via ORAL
  Filled 2011-06-04 (×4): qty 1

## 2011-06-04 MED ORDER — BUPROPION HCL ER (SR) 150 MG PO TB12
450.0000 mg | ORAL_TABLET | Freq: Every day | ORAL | Status: DC
  Administered 2011-06-04 – 2011-06-05 (×2): 450 mg via ORAL
  Filled 2011-06-04 (×2): qty 3

## 2011-06-04 MED ORDER — BUDESONIDE-FORMOTEROL FUMARATE 80-4.5 MCG/ACT IN AERO
2.0000 | INHALATION_SPRAY | Freq: Two times a day (BID) | RESPIRATORY_TRACT | Status: DC
  Administered 2011-06-04: 2 via RESPIRATORY_TRACT
  Filled 2011-06-04: qty 6.9

## 2011-06-04 NOTE — H&P (Signed)
PCP:   Elyn Peers, MD, MD   Chief Complaint: Syncope   HPI: [name] is an 56 y.o. female with history of morbid obesity, hypertension, sleep apnea on each bedtime CPAP, hypothyroidism, depression, asthma, history of tachyarrhythmia in the past,  woke up from her sleep to go to the bathroom, felt lightheaded, and subsequently had a syncopal episode. This was a non-witnessed event. She has no bowel or bladder incontinence and no postictal confusion. She denied any pleuritic chest pain, but admitted to having baseline shortness of breath. She denied nausea, vomiting, fever, chills, cough, black stool, bloody stool, abdominal cramps or pain. Workup in the emergency room included a normal white count, hemoglobin, renal functions, electrolytes, and cardiac markers. Head CT was unremarkable except for cerumen impaction. Her chest x-ray showed no acute cardiopulmonary disease. Hospitalist was asked to admit her for syncopal workup.  Rewiew of Systems:  The patient denies anorexia, fever, weight loss,, vision loss, decreased hearing, hoarseness, chest pain, dyspnea on exertion, peripheral edema, balance deficits, hemoptysis, abdominal pain, melena, hematochezia, severe indigestion/heartburn, hematuria, incontinence, genital sores, muscle weakness, suspicious skin lesions, transient blindness, difficulty walking, depression, unusual weight change, abnormal bleeding, enlarged lymph nodes, angioedema, and breast masses.    Past Medical History  Diagnosis Date  . Depression   . Hypertension   . Thyroid disease   . GERD (gastroesophageal reflux disease)   . Pancreatitis     hx of  . Knee pain, bilateral   . Obesity   . HSV (herpes simplex virus) infection   . Hyperlipemia   . Asthma   . Tachycardia     with sx in 2008  . Breast mass in female     L breast 2008, US showed likely fat necrosis  . Sleep apnea   . Arthritis     Past Surgical History  Procedure Date  . Replacement total  knee bilateral   . Foot surgery   . Abdominal hysterectomy   . Abdominal surgery   . Cholecystectomy   . Breast surgery   . Joint replacement   . Thyroid surgery     Medications:  HOME MEDS: Prior to Admission medications   Medication Sig Start Date End Date Taking? Authorizing Provider  albuterol (PROVENTIL HFA;VENTOLIN HFA) 108 (90 BASE) MCG/ACT inhaler Inhale 2 puffs into the lungs every 6 (six) hours as needed. For wheezing   Yes Historical Provider, MD  amLODipine (NORVASC) 10 MG tablet Take 10 mg by mouth daily.   Yes Historical Provider, MD  ARIPiprazole (ABILIFY) 2 MG tablet Take 2 mg by mouth daily.   Yes Historical Provider, MD  budesonide-formoterol (SYMBICORT) 80-4.5 MCG/ACT inhaler Inhale 2 puffs into the lungs 2 (two) times daily.   Yes Historical Provider, MD  buPROPion (WELLBUTRIN SR) 150 MG 12 hr tablet Take 450 mg by mouth daily.   Yes Historical Provider, MD  cyclobenzaprine (FLEXERIL) 10 MG tablet Take 10 mg by mouth 3 (three) times daily as needed. For muscle spasms   Yes Historical Provider, MD  diclofenac sodium (VOLTAREN) 1 % GEL Apply 1 application topically 3 (three) times daily as needed.     Yes Historical Provider, MD  esomeprazole (NEXIUM) 40 MG capsule Take 40 mg by mouth daily before breakfast.   Yes Historical Provider, MD  furosemide (LASIX) 40 MG tablet Take 40 mg by mouth daily.   Yes Historical Provider, MD  gabapentin (NEURONTIN) 300 MG capsule Take 300 mg by mouth 3 (three) times daily.  Yes Historical Provider, MD  HYDROcodone-acetaminophen (LORTAB) 10-500 MG per tablet Take 1 tablet by mouth every 4 (four) hours as needed. For pain   Yes Historical Provider, MD  levothyroxine (SYNTHROID, LEVOTHROID) 175 MCG tablet Take 175 mcg by mouth daily.   Yes Historical Provider, MD  LORazepam (ATIVAN) 1 MG tablet Take 1 mg by mouth every 8 (eight) hours as needed. For anxiety   Yes Historical Provider, MD  meloxicam (MOBIC) 15 MG tablet Take 15 mg by  mouth daily.   Yes Historical Provider, MD  PARoxetine (PAXIL) 20 MG tablet Take 20 mg by mouth every morning.   Yes Historical Provider, MD  pravastatin (PRAVACHOL) 80 MG tablet Take 80 mg by mouth daily.   Yes Historical Provider, MD  QUEtiapine (SEROQUEL) 300 MG tablet Take 900 mg by mouth at bedtime.   Yes Historical Provider, MD  traZODone (DESYREL) 100 MG tablet Take 100 mg by mouth at bedtime.   Yes Historical Provider, MD  valACYclovir (VALTREX) 1000 MG tablet Take 1,000 mg by mouth daily.   Yes Historical Provider, MD  zolpidem (AMBIEN) 10 MG tablet Take 10 mg by mouth at bedtime as needed. For sleep   Yes Historical Provider, MD     Allergies:  Allergies  Allergen Reactions  . Shellfish Allergy Shortness Of Breath  . Morphine And Related Other (See Comments)    Overly sedated  . Oxycodone Hcl     REACTION: hives    Social History:   reports that she quit smoking about 19 years ago. Her smoking use included Cigarettes. She has a 10 pack-year smoking history. She has never used smokeless tobacco. She reports that she does not drink alcohol or use illicit drugs.  Family History: Family History  Problem Relation Age of Onset  . Heart disease Brother   . Diabetes Brother   . Hypertension Mother   . Diabetes Mother   . Cancer Mother   . Hypertension Sister   . Diabetes Sister   . Cancer Sister      Physical Exam: Filed Vitals:   06/04/11 0030 06/04/11 0100 06/04/11 0130 06/04/11 0200  BP: 137/113 148/89 89/70 101/65  Pulse: 90 94 88 87  Temp:      TempSrc:      Resp: _0 SpO2: 96% 95% 95% 96%   Blood pressure 101/65, pulse 87, temperature 98.3 F (36.8 C), temperature source Oral, resp. rate 15, SpO2 96.00%.  GEN:  Pleasant  person lying in the stretcher in no acute distress; cooperative with exam PSYCH:  alert and oriented x4; does not appear anxious or depressed; affect is appropriate. HEENT: Mucous membranes pink and anicteric; PERRLA; EOM intact;  no cervical lymphadenopathy nor thyromegaly or carotid bruit; no JVD; Breasts:: Not examined CHEST WALL: No tenderness CHEST: Normal respiration, clear to auscultation bilaterally HEART: Regular rate and rhythm; no murmurs rubs or gallops BACK: No kyphosis or scoliosis; no CVA tenderness ABDOMEN: Obese, soft non-tender; no masses, no organomegaly, normal abdominal bowel sounds; no pannus; no intertriginous candida. Rectal Exam: Not done EXTREMITIES: No bone or joint deformity; age-appropriate arthropathy of the hands and knees; no edema; no ulcerations. Genitalia: not examined PULSES: 2+ and symmetric SKIN: Normal hydration no rash or ulceration CNS: Cranial nerves 2-12 grossly intact no focal lateralizing neurologic deficit   Labs & Imaging Results for orders placed during the hospital encounter of 06/03/11 (from the past 48 hour(s))  CBC     Status: Normal   Collection Time  06/03/11 10:46 PM      Component Value Range Comment   WBC 7.6  4.0 - 10.5 (K/uL)    RBC 4.48  3.87 - 5.11 (MIL/uL)    Hemoglobin 12.0  12.0 - 15.0 (g/dL)    HCT 37.2  36.0 - 46.0 (%)    MCV 83.0  78.0 - 100.0 (fL)    MCH 26.8  26.0 - 34.0 (pg)    MCHC 32.3  30.0 - 36.0 (g/dL)    RDW 15.3  11.5 - 15.5 (%)    Platelets 191  150 - 400 (K/uL)   DIFFERENTIAL     Status: Normal   Collection Time   06/03/11 10:46 PM      Component Value Range Comment   Neutrophils Relative 56  43 - 77 (%)    Neutro Abs 4.3  1.7 - 7.7 (K/uL)    Lymphocytes Relative 34  12 - 46 (%)    Lymphs Abs 2.6  0.7 - 4.0 (K/uL)    Monocytes Relative 5  3 - 12 (%)    Monocytes Absolute 0.4  0.1 - 1.0 (K/uL)    Eosinophils Relative 4  0 - 5 (%)    Eosinophils Absolute 0.3  0.0 - 0.7 (K/uL)    Basophils Relative 1  0 - 1 (%)    Basophils Absolute 0.1  0.0 - 0.1 (K/uL)   BASIC METABOLIC PANEL     Status: Abnormal   Collection Time   06/03/11 10:46 PM      Component Value Range Comment   Sodium 138  135 - 145 (mEq/L)    Potassium 4.0  3.5  - 5.1 (mEq/L)    Chloride 101  96 - 112 (mEq/L)    CO2 27  19 - 32 (mEq/L)    Glucose, Bld 127 (*) 70 - 99 (mg/dL)    BUN 16  6 - 23 (mg/dL)    Creatinine, Ser 1.01  0.50 - 1.10 (mg/dL)    Calcium 9.7  8.4 - 10.5 (mg/dL)    GFR calc non Af Amer 61 (*) >90 (mL/min)    GFR calc Af Amer 71 (*) >90 (mL/min)   POCT I-STAT TROPONIN I     Status: Normal   Collection Time   06/03/11 11:00 PM      Component Value Range Comment   Troponin i, poc 0.00  0.00 - 0.08 (ng/mL)    Comment 3            GLUCOSE, CAPILLARY     Status: Abnormal   Collection Time   06/03/11 11:01 PM      Component Value Range Comment   Glucose-Capillary 128 (*) 70 - 99 (mg/dL)    Comment 1 Documented in Chart      Comment 2 Notify RN      Dg Chest 2 View  06/04/2011  *RADIOLOGY REPORT*  Clinical Data: Shortness of breath.  Syncope.  CHEST - 2 VIEW  Comparison: 03/17/2011and 07/19/2007  Findings: Surgical clips in the right thoracic inlet.  Borderline cardiomegaly is stable.  Chronic interstitial prominence is stable. No focal airspace opacities, pleural effusion, or pneumothorax is identified.  IMPRESSION: Stable chest radiograph.  Chronic interstitial prominence and borderline cardiomegaly.  No acute findings identified.  Original Report Authenticated By: Curlene Dolphin, M.D.   Ct Head Wo Contrast  06/03/2011  *RADIOLOGY REPORT*  Clinical Data: Dizziness, weakness; status post syncope and fall. Hit left side of head.  CT HEAD WITHOUT CONTRAST  Technique:  Contiguous axial images were obtained from the base of the skull through the vertex without contrast.  Comparison: None.  Findings: There is no evidence of acute infarction, mass lesion, or intra- or extra-axial hemorrhage on CT.  The posterior fossa, including the cerebellum, brainstem and fourth ventricle, is within normal limits.  The third and lateral ventricles, and basal ganglia are unremarkable in appearance.  The cerebral hemispheres are symmetric in appearance, with  normal gray- white differentiation.  No mass effect or midline shift is seen.  There is no evidence of fracture; visualized osseous structures are unremarkable in appearance.  The visualized portions of the orbits are within normal limits.  The paranasal sinuses and mastoid air cells are well-aerated.  No significant soft tissue abnormalities are seen.  A large amount of cerumen is noted filling the right external auditory canal, and a small amount of cerumen is noted within the left external auditory canal.  IMPRESSION:  1.  No evidence of traumatic intracranial injury or fracture. 2.  Large amount of cerumen noted filling the right external auditory canal, and a small amount of cerumen within the left external auditory canal.  Original Report Authenticated By: Santa Lighter, M.D.      Assessment Present on Admission:  .Syncope and collapse .HYPOTHYROIDISM .OBESITY .HYPERTENSION .HYPERLIPIDEMIA .DEPRESSION .ASTHMA   PLAN: Will admit her for syncopal workup to include monitoring of her heart rhythm, rule out with serial CPKs and troponins and to be prudent, we'll get a CT pulmonary angiogram as well. She is obese, short of breath, and had a syncopal episode so PE is in the differential. It is possible that she had this syncope from hypotension due to medications. Will stop her Lasix in case she slightly volume depleted. I will continue her other antihypertensive therapy. We'll continue her Synthroid supplement and check TSH. For her hyperlipidemia we'll continue her statin. Will perform cardiac echo as part of her syncope workup as well. Other plans as per orders.    Gwenith Tschida 06/04/2011, 2:24 AM

## 2011-06-04 NOTE — Progress Notes (Signed)
Spoke with Dr. Marin Comment to confirm that we will not be working the pt up for stroke.  Will cont to monitor pt.

## 2011-06-04 NOTE — Progress Notes (Signed)
Patient seen and examined, admitted this morning by Dr. Orvan Falconer. Briefly 56 year old female with history of obesity, hypertension, obstructive sleep apnea, hypothyroidism, history of tachyarrhythmias in the past admitted with syncopal episode. Patient also gives a vague history of 'room spinning around' before she became lightheaded and passed out. - Agree with current plan per Dr Marin Comment - CT head negative, one set of cardiac enzymes negative, CTA chest negative for any PE - Await 2-D echocardiogram, carotid Dopplers -  Added meclizine, PTOT evaluation - Hold PO antihypertensives due to soft BP, check orthostatics   Ihsan Nomura M.D. [facility] 06/04/2011, 11:33 AM  Pager: [phone]

## 2011-06-04 NOTE — ED Notes (Signed)
Patient transported to CT 

## 2011-06-04 NOTE — Progress Notes (Signed)
  Echocardiogram 2D Echocardiogram has been performed.  Diamond Nickel Deneen 06/04/2011, 3:02 PM

## 2011-06-04 NOTE — Progress Notes (Signed)
Pharmacy: Lovenox Adjustment for VTE px  OBJECTIVE:  Wt: 156 kg, Ht: 67 inches, BMI~53.9 SCr: 1.03, CrCl~80-90 ml/min (normalized)  ASSESSMENT:  56 y.o. F on lovenox for VTE prophylaxis requiring a dose adjustment for BMI >30 and CrCl>30 ml/min. The patient was admitted with syncope and a head CT has ruled out an intracranial abnormality (i.e. CVA)  PLAN:  1. Increase lovenox to 75 mg SQ every 24 hours (0.5 mg/kg dosing for BMI>30 and CrCl>30 ml/min) 2. Will continue to follow weight and renal function changes for any additional dose adjustments.   Alycia Rossetti, PharmD, BCPS Clinical Pharmacist Pager: [phone] 06/04/2011 11:16 AM

## 2011-06-04 NOTE — ED Provider Notes (Signed)
Medical screening examination/treatment/procedure(s) were performed by non-physician practitioner and as supervising physician I was immediately available for consultation/collaboration.   Delora Fuel, MD [id number] [phone]

## 2011-06-04 NOTE — Progress Notes (Signed)
06/04/2011 [name], [place] Case Management Note [phone]  Utilization review completed.

## 2011-06-05 ENCOUNTER — Inpatient Hospital Stay (HOSPITAL_COMMUNITY): Payer: Medicare Other

## 2011-06-05 MED ORDER — MECLIZINE HCL 12.5 MG PO TABS: 25.0000 mg | ORAL_TABLET | Freq: Two times a day (BID) | ORAL | Status: AC

## 2011-06-05 MED ORDER — LEVOTHYROXINE SODIUM 200 MCG PO TABS: 200.0000 ug | ORAL_TABLET | Freq: Every day | ORAL | Status: DC

## 2011-06-05 MED ORDER — MECLIZINE HCL 25 MG PO TABS
25.0000 mg | ORAL_TABLET | Freq: Two times a day (BID) | ORAL | Status: DC
  Filled 2011-06-05: qty 1

## 2011-06-05 MED ORDER — LEVOTHYROXINE SODIUM 200 MCG PO TABS
200.0000 ug | ORAL_TABLET | Freq: Every day | ORAL | Status: DC
  Filled 2011-06-05: qty 1

## 2011-06-05 NOTE — Progress Notes (Signed)
Pt sitting on side of bed giving herself a bath. Pt has no complaints at this time. Pt denies dizziness and SOB. Will continue to monitor. Hulen Luster, RN

## 2011-06-05 NOTE — Progress Notes (Signed)
I agree with the assessments and medication admin done by Alric Seton student from 7p-7a.

## 2011-06-05 NOTE — Progress Notes (Signed)
Pt provided with d/c instructions, education and prescriptions. Pt was seen and set up with [facility] for PT vestibular training. Pt and pt son are aware. Pt and pt son verbalize understanding of all medications and how pt should take them. Pt has no questions at this time. Pt appears stable for d/c. IVs removed with tip intact and heart monitor returned to front. Pt leaving unit in wheel chair with her son. Hulen Luster, RN

## 2011-06-05 NOTE — Discharge Summary (Signed)
Physician Discharge Summary  Patient ID: [name] MRN: [id number] DOB/AGE: March 31, 1955 56 y.o.  Admit date: 06/03/2011 Discharge date: 06/05/2011  Primary Care Physician:  Elyn Peers, MD, MD  Discharge Diagnoses:    .Syncope and collapse .HYPOTHYROIDISM: TSH 16.49  .OBESITY . orthostatic hypotension  .HYPERLIPIDEMIA .DEPRESSION .ASTHMA Vertigo  Consults:  None   Discharge Medications: Medication List  As of 06/05/2011  2:28 PM   STOP taking these medications         amLODipine 10 MG tablet      furosemide 40 MG tablet         TAKE these medications         albuterol 108 (90 BASE) MCG/ACT inhaler   Commonly known as: PROVENTIL HFA;VENTOLIN HFA   Inhale 2 puffs into the lungs every 6 (six) hours as needed. For wheezing      ARIPiprazole 2 MG tablet   Commonly known as: ABILIFY   Take 2 mg by mouth daily.      budesonide-formoterol 80-4.5 MCG/ACT inhaler   Commonly known as: SYMBICORT   Inhale 2 puffs into the lungs 2 (two) times daily.      buPROPion 150 MG 12 hr tablet   Commonly known as: WELLBUTRIN SR   Take 450 mg by mouth daily.      cyclobenzaprine 10 MG tablet   Commonly known as: FLEXERIL   Take 10 mg by mouth 3 (three) times daily as needed. For muscle spasms      esomeprazole 40 MG capsule   Commonly known as: NEXIUM   Take 40 mg by mouth daily before breakfast.      gabapentin 300 MG capsule   Commonly known as: NEURONTIN   Take 300 mg by mouth 3 (three) times daily.      HYDROcodone-acetaminophen 10-500 MG per tablet   Commonly known as: LORTAB   Take 1 tablet by mouth every 4 (four) hours as needed. For pain      levothyroxine 200 MCG tablet   Commonly known as: SYNTHROID, LEVOTHROID   Take 1 tablet (200 mcg total) by mouth daily before breakfast.      LORazepam 1 MG tablet   Commonly known as: ATIVAN   Take 1 mg by mouth every 8 (eight) hours as needed. For anxiety      meclizine 12.5 MG tablet   Commonly known as:  ANTIVERT   Take 2 tablets (25 mg total) by mouth 2 (two) times daily.      meloxicam 15 MG tablet   Commonly known as: MOBIC   Take 15 mg by mouth daily.      PARoxetine 20 MG tablet   Commonly known as: PAXIL   Take 20 mg by mouth every morning.      pravastatin 80 MG tablet   Commonly known as: PRAVACHOL   Take 80 mg by mouth daily.      QUEtiapine 300 MG tablet   Commonly known as: SEROQUEL   Take 900 mg by mouth at bedtime.      traZODone 100 MG tablet   Commonly known as: DESYREL   Take 100 mg by mouth at bedtime.      valACYclovir 1000 MG tablet   Commonly known as: VALTREX   Take 1,000 mg by mouth daily.      VOLTAREN 1 % Gel   Generic drug: diclofenac sodium   Apply 1 application topically 3 (three) times daily as needed.      zolpidem 10 MG  tablet   Commonly known as: AMBIEN   Take 10 mg by mouth at bedtime as needed. For sleep             Brief H and P: For complete details please refer to admission H and P, but in brief [name] is an 56 y.o. female with history of morbid obesity, hypertension, sleep apnea on each bedtime CPAP, hypothyroidism, depression, asthma, history of tachyarrhythmia in the past, woke up from her sleep to go to the bathroom, felt lightheaded, and subsequently had a syncopal episode. This was a non-witnessed event. She had no bowel or bladder incontinence and no postictal confusion. She denied any pleuritic chest pain, but admitted to having baseline shortness of breath. She denied nausea, vomiting, fever, chills, cough, black stool, bloody stool, abdominal cramps or pain. Workup in the emergency room included a normal white count, hemoglobin, renal functions, electrolytes, and cardiac markers. Head CT was unremarkable except for cerumen impaction. Her chest x-ray showed no acute cardiopulmonary disease. Hospitalist was asked to admit her for syncopal workup.   Hospital Course:  Briefly 56 year old female with history of obesity,  hypertension, obstructive sleep apnea, hypothyroidism, history of tachyarrhythmias in the past admitted with syncopal episode. Patient also gave a vague history of 'room spinning around' before she became lightheaded and passed out. Patient was admitted to the telemetry floor, ruled out for acute ACS.  CT head was negative for any acute intracranial pathology except for cerumen impaction. CTA chest done in the ED was negative for any PE. Patient had 2-D echocardiogram done which was essentially unremarkable with EF of 55-60%, carotid Dopplers were done and showed no ICA stenosis. Patient did give history of vertigo as well hence underwent an MRI which showed no cerebellar pathology or any acoustic neuroma. Physical therapy recommended vestibular rehabilitation. Patient was also started on meclizine.  Orthostatic hypotension: Norvasc and Lasix were held and patient was hydrated with IV fluids. Patient was counseled to hold antihypertensives until she has followup with her primary care physician.  Hypothyroidism: TSH was 16.49, Synthroid dose was increased to 200 mcg daily. patient was counseled to obtain thyroid panel in 4 weeks.     Day of Discharge BP 107/65  Pulse 89  Temp(Src) 96.9 F (36.1 C) (Oral)  Resp 19  Ht _0  (1.702 m)  Wt 155.901 kg (343 lb 11.2 oz)  BMI 53.83 kg/m2  SpO2 93%  Physical Exam: General: Alert and awake oriented x3 not in any acute distress. HEENT: anicteric sclera, pupils reactive to light and accommodation CVS: S1-S2 clear no murmur rubs or gallops Chest: clear to auscultation bilaterally, no wheezing rales or rhonchi Abdomen: soft nontender, nondistended, normal bowel sounds, no organomegaly Extremities: no cyanosis, clubbing or edema noted bilaterally Neuro: Cranial nerves II-XII intact, no focal neurological deficits   The results of significant diagnostics from this hospitalization (including imaging, microbiology, ancillary and laboratory) are listed  below for reference.    LAB RESULTS: Basic Metabolic Panel:  Lab 38/75/64 0550 06/03/11 2246  NA 138 138  K 3.7 4.0  CL 101 101  CO2 27 27  GLUCOSE 135* 127*  BUN 16 16  CREATININE 1.03 1.01  CALCIUM 9.4 9.7  MG -- --  PHOS -- --   CBC:  Lab 06/04/11 0550 06/03/11 2246  WBC 7.2 7.6  NEUTROABS -- 4.3  HGB 11.6* 12.0  HCT 35.6* 37.2  MCV 83.0 --  PLT 176 191   Cardiac Enzymes:  Lab 06/04/11 2011 06/04/11  1045  CKTOTAL 175 180*  CKMB 1.9 2.1  CKMBINDEX -- --  TROPONINI <0.30 <0.30   CBG:  Lab 06/03/11 2301  GLUCAP 128*    Significant Diagnostic Studies:  Dg Chest 2 View  06/04/2011  *RADIOLOGY REPORT*  Clinical Data: Shortness of breath.  Syncope.  CHEST - 2 VIEW  Comparison: 03/17/2011and 07/19/2007  Findings: Surgical clips in the right thoracic inlet.  Borderline cardiomegaly is stable.  Chronic interstitial prominence is stable. No focal airspace opacities, pleural effusion, or pneumothorax is identified.  IMPRESSION: Stable chest radiograph.  Chronic interstitial prominence and borderline cardiomegaly.  No acute findings identified.  Original Report Authenticated By: Curlene Dolphin, M.D.   Ct Head Wo Contrast  06/03/2011  *RADIOLOGY REPORT*  Clinical Data: Dizziness, weakness; status post syncope and fall. Hit left side of head.  CT HEAD WITHOUT CONTRAST  Technique:  Contiguous axial images were obtained from the base of the skull through the vertex without contrast.  Comparison: None.  Findings: There is no evidence of acute infarction, mass lesion, or intra- or extra-axial hemorrhage on CT.  The posterior fossa, including the cerebellum, brainstem and fourth ventricle, is within normal limits.  The third and lateral ventricles, and basal ganglia are unremarkable in appearance.  The cerebral hemispheres are symmetric in appearance, with normal gray- white differentiation.  No mass effect or midline shift is seen.  There is no evidence of fracture; visualized osseous  structures are unremarkable in appearance.  The visualized portions of the orbits are within normal limits.  The paranasal sinuses and mastoid air cells are well-aerated.  No significant soft tissue abnormalities are seen.  A large amount of cerumen is noted filling the right external auditory canal, and a small amount of cerumen is noted within the left external auditory canal.  IMPRESSION:  1.  No evidence of traumatic intracranial injury or fracture. 2.  Large amount of cerumen noted filling the right external auditory canal, and a small amount of cerumen within the left external auditory canal.  Original Report Authenticated By: Santa Lighter, M.D.   Ct Angio Chest W/cm &/or Wo Cm  06/04/2011  *RADIOLOGY REPORT*  Clinical Data: Chest pain and shortness of breath; dizziness. Status post fall.  CT ANGIOGRAPHY CHEST  Technique:  Multidetector CT imaging of the chest using the standard protocol during bolus administration of intravenous contrast. Multiplanar reconstructed images including MIPs were obtained and reviewed to evaluate the vascular anatomy.  Contrast: 154m OMNIPAQUE IOHEXOL 350 MG/ML SOLN  Comparison: Chest radiograph performed 06/03/2011, and V/Q scan performed 07/11/2007  Findings: There is no evidence of significant pulmonary embolus. Evaluation for pulmonary embolus is suboptimal due to motion artifact.  Trace bilateral pleural effusions are noted, with mild bilateral dependent subsegmental atelectasis.  No definite pulmonary edema is seen.  There is no evidence of significant focal consolidation or pneumothorax.  No masses are identified; no abnormal focal contrast enhancement is seen.  The mediastinum is unremarkable in appearance.  No mediastinal lymphadenopathy is seen.  No pericardial effusion identified.  The great vessels are unremarkable in appearance.  Postoperative change is noted about the thyroid bed; there is a 2.0 cm cystic focus arising inferior to the left thyroid lobe.  A tiny  hiatal hernia is seen.  No axillary lymphadenopathy is seen.  The visualized portions of the liver and spleen are unremarkable.  No acute osseous abnormalities are seen.  IMPRESSION:  1.  No evidence of significant pulmonary embolus. 2.  Trace bilateral pleural effusions, with mild  bilateral dependent subsegmental atelectasis.  No definite pulmonary edema seen. 3.  Tiny hiatal hernia noted. 4.  2.0 cm cystic lesion arising inferior to the left thyroid lobe; postoperative change noted about the right thyroid bed.  Consider further evaluation with thyroid ultrasound.  If patient is clinically hyperthyroid, consider nuclear medicine thyroid uptake and scan.  Original Report Authenticated By: Santa Lighter, M.D.     Disposition and Follow-up: Discharge Orders    Future Orders Please Complete By Expires   Diet - low sodium heart healthy      Increase activity slowly      Discharge instructions      Comments:   Please donot take any lasix, norvasc BP meds until you follow-up with Dr Criss Rosales within next 7-10 days. Check TSH (thyroid panel)in 4 weeks       DISPOSITION: Home  DIET: Heart healthy  ACTIVITY: As tolerated  DISCHARGE FOLLOW-UP Follow-up Information    Follow up with Elyn Peers, MD. Schedule an appointment as soon as possible for a visit in 10 days. (for hospial follow-up)    Contact information:   [address] [place] [phone]          Time spent on Discharge: 45 minutes  Signed:  Myrna Vonseggern M.D. [facility] 06/05/2011, 2:28 PM

## 2011-06-05 NOTE — Progress Notes (Signed)
06/05/2011 [place], [place] Case Management Note [place]   Agencies that are Medicare-Certified and are affiliated with The [place]  Telephone Number Address  [place] has ownership interest in this company; however, you are under no obligation to use this agency. [phone] or  [place], [place] [address]   Agencies that are Medicare-Certified and are not affiliated with The [place] Telephone Number Address  [facility] [phone] Fax [phone] [address], [place], [place]  [address]  [facility] [phone] or [phone] Fax [phone] [address] [place], [place] [address]  [facility] [phone] Fax [phone] [place], [place] [address]  [place] [phone] Fax [phone] [address] [place], [place]  [address]  [facility] [phone] Fax [phone] [address] [place], [place] [address]  [facility] [place], [place] [address]  [facility] [phone]  [address], [place] [address]  [facility] [phone] or [phone] Fax [phone] [place] [address], [place] [address] [place], [place]  [address]  [facility] [phone] Fax [phone] [place], [place]  [address]  [place]  [phone] Fax [phone] [address] [place], [place] [address]               Agencies that are not Medicare-Certified and are not affiliated with The [place] Telephone Number Address  [place] or [phone] Fax [phone] [address] [place]  [address]  [facility] [phone] Fax [phone] [address] [place], [place]  [address]  [facility]  [phone] Fax [phone] [address] [place], [place] [address]  [facility] [phone] Fax [phone] [address] [place], [place] [address]  [facility] [phone] or [phone] Fax [phone] [address] [place], [place]  [address]  [facility] [phone] or [phone] Fax [phone] [address] [place], [place]  [address]  [facility] [phone] Fax [phone] [address] [place], [place]  [address]  [facility] [phone] [address] [place], [place]  [address]  [place] [phone] Fax [phone] [address] [place], [place]  [address]  [place]. [phone] Fax [phone] [address] [place], [place]  [address]  [facility] [place]. [phone] Fax [phone] [address] [place], [place] [address]  [facility] [phone] Fax [phone] [address] [place], [place]  [address]   In to offer patient choice for home health vestibular PT.  Patient chose advanced home care. Appropriate referrals will be made.

## 2011-06-05 NOTE — Progress Notes (Signed)
VASCULAR LAB PRELIMINARY  PRELIMINARY  PRELIMINARY  PRELIMINARY  Carotid Dopplers completed.    Preliminary report: There is no ICA stenosis.  Vertebral artery flow is antegrade.  [name], 06/05/2011, 2:15 PM

## 2011-06-05 NOTE — Evaluation (Signed)
Physical Therapy Evaluation Patient Details Name: [name] MRN: [id number] DOB: 28-Jun-1955 Today's Date: 06/05/2011  Problem List:  Patient Active Problem List  Diagnoses  . HSV  . HYPOTHYROIDISM  . HYPERLIPIDEMIA  . OBESITY  . DEPRESSION  . HYPERTENSION  . ASTHMA  . GERD  . CONSTIPATION  . KNEE PAIN  . Syncope and collapse    Past Medical History:  Past Medical History  Diagnosis Date  . Depression   . Hypertension   . Thyroid disease   . GERD (gastroesophageal reflux disease)   . Pancreatitis     hx of  . Knee pain, bilateral   . Obesity   . HSV (herpes simplex virus) infection   . Hyperlipemia   . Asthma   . Tachycardia     with sx in 2008  . Breast mass in female     L breast 2008, US showed likely fat necrosis  . Sleep apnea   . Arthritis    Past Surgical History:  Past Surgical History  Procedure Date  . Replacement total knee bilateral   . Foot surgery   . Abdominal hysterectomy   . Abdominal surgery   . Cholecystectomy   . Breast surgery   . Joint replacement   . Thyroid surgery     PT Assessment/Plan/Recommendation PT Assessment Clinical Impression Statement: 56 y.o. female admitted to [facility] for syncopal episode and dizziness.  She presents today with decreased balance, suspected positional vertigo, decreased mobility, decreased gait and decreased activity tolerance.  She would benefit from acute PT to maximize her independence, functional mobility and safety so that she may be able to return home with family's 24 hour assist and HHPT (vestibular treatment) safely at discharge.  Vestibular testing limited by Me[place] as follows: occulomotor testing with decreased gaze stability to the night.  No nystagmus or saccades noted, some uneven eye alignment noted, horizontal head shaking test (+) for symptoms and difficult for patient to complete, vertical head shaking (-), modified dix hallpike (+) for symptoms to the right ear, but no nystagmus noted.   I did not test the left ear due to the patient had so much difficulty doing even the modified version on the right due to obesity.  Would need 2 people to complete effectively.  I suspect that her dizziness is positional in nature, but it is hard to say with the combination of other symptoms and Meclazine supressing the entire system.  I DO recommend HHPT f/u for vestibular treatment at discharge and 24 hour assist from family, which son confirms that he and his sister can provide.  I aslo recommended that the patient use her RW for a short time at home and that the day the home PT comes out for the inital assessment that they do not take the [place] until after the therapist's assessment.  No HEP give due to the inconclusive nature of the testing.  WIll defer HEP to HHPT or if the patient is still here tomorrow to therapist tomorrow.   PT Recommendation/Assessment: Patient will need skilled PT in the acute care venue PT Problem List: Decreased activity tolerance;Decreased balance;Decreased mobility;Obesity (dizziness) PT Therapy Diagnosis : Difficulty walking;Abnormality of gait (dizziness/vertigo) PT Plan PT Frequency: Min 3X/week PT Treatment/Interventions: DME instruction;Gait training;Stair training;Functional mobility training;Therapeutic activities;Therapeutic exercise;Balance training;Neuromuscular re-education;Patient/family education PT Recommendation Follow Up Recommendations: Home health PT;Supervision/Assistance - 24 hour (vestibular PT- for HH f/u) Equipment Recommended: None recommended by PT PT Goals  Acute Rehab PT Goals PT Goal Formulation: With  patient/family Time For Goal Achievement: 2 weeks Pt will go Supine/Side to Sit: with modified independence;with HOB 0 degrees PT Goal: Supine/Side to Sit - Progress: Goal set today Pt will go Sit to Supine/Side: with modified independence;with HOB 0 degrees PT Goal: Sit to Supine/Side - Progress: Goal set today Pt will go Sit to Stand:  with modified independence PT Goal: Sit to Stand - Progress: Goal set today Pt will go Stand to Sit: with modified independence PT Goal: Stand to Sit - Progress: Goal set today Pt will Transfer Bed to Chair/Chair to Bed: with modified independence PT Transfer Goal: Bed to Chair/Chair to Bed - Progress: Goal set today Pt will Ambulate: >150 feet;with least restrictive assistive device PT Goal: Ambulate - Progress: Goal set today Pt will Go Up / Down Stairs: 1-2 stairs;with min assist PT Goal: Up/Down Stairs - Progress: Goal set today  PT Evaluation Precautions/Restrictions  Precautions Precautions: Fall Prior Functioning  Home Living Lives With: Alone Available Help at Discharge: Family (daughter and son 24 hour PRN) Type of Home: House Home Access: Stairs to enter Technical brewer of Steps: 1 Entrance Stairs-Rails: None Home Layout: One level Home Adaptive Equipment: Walker - rolling;Straight cane Prior Function Level of Independence: Independent Able to Take Stairs?: Yes Driving: Yes Cognition Cognition Overall Cognitive Status: Appears within functional limits for tasks assessed/performed Extremity Assessment RLE Assessment RLE Assessment: Within Functional Limits LLE Assessment LLE Assessment: Within Functional Limits Mobility (including Balance) Bed Mobility Bed Mobility: Yes Supine to Sit: 6: Modified independent (Device/Increase time);With rails;HOB elevated (Comment degrees) (40 degrees) Sitting - Scoot to Edge of Bed: 6: Modified independent (Device/Increase time) Transfers Transfers: Yes Sit to Stand: 4: Min assist;From bed;With upper extremity assist Sit to Stand Details (indicate cue type and reason): min assist to steady patient for balance.   Stand to Sit: 4: Min assist Stand to Sit Details: min assist to control descent to sit Ambulation/Gait Ambulation/Gait: Yes Ambulation/Gait Assistance: 4: Min assist Ambulation/Gait Assistance Details  (indicate cue type and reason): min hand held assist to steady patient for balance, slow staggering gait with increased dizziness with gait.   Ambulation Distance (Feet): 25 Feet Assistive device: 1 person hand held assist Gait Pattern:  (staggering with trunk anterior of feet-falling forward.  )    End of Session PT - End of Session Activity Tolerance: Treatment limited secondary to medical complications (Comment) (limited by dizziness and vertig symptoms.  ) Patient left: in bed;with call bell in reach;with family/visitor present (son in room) Nurse Communication: Mobility status for ambulation (d/c recs) General Cognition: WFL for tasks performed  Romey Cohea B. [place], [place], DPT [phone] 06/05/2011, 3:02 PM

## 2011-06-30 ENCOUNTER — Encounter: Payer: Self-pay | Admitting: Physical Medicine and Rehabilitation

## 2011-07-01 ENCOUNTER — Other Ambulatory Visit: Payer: Self-pay | Admitting: Internal Medicine

## 2011-07-09 ENCOUNTER — Encounter
Payer: Medicare Other | Attending: Physical Medicine and Rehabilitation | Admitting: Physical Medicine and Rehabilitation

## 2011-08-03 ENCOUNTER — Other Ambulatory Visit: Payer: Self-pay | Admitting: Internal Medicine

## 2011-12-29 ENCOUNTER — Other Ambulatory Visit (HOSPITAL_COMMUNITY): Payer: Self-pay | Admitting: Family Medicine

## 2011-12-29 DIAGNOSIS — Z1231 Encounter for screening mammogram for malignant neoplasm of breast: Secondary | ICD-10-CM

## 2011-12-29 DIAGNOSIS — Z803 Family history of malignant neoplasm of breast: Secondary | ICD-10-CM

## 2012-01-18 HISTORY — PX: BREAST SURGERY: SHX581

## 2012-01-20 ENCOUNTER — Ambulatory Visit (HOSPITAL_COMMUNITY)
Admission: RE | Admit: 2012-01-20 | Discharge: 2012-01-20 | Disposition: A | Discharge status: Home / Self Care | Payer: Medicare Other | Source: Ambulatory Visit | Attending: Family Medicine | Admitting: Family Medicine

## 2012-01-20 DIAGNOSIS — Z803 Family history of malignant neoplasm of breast: Secondary | ICD-10-CM

## 2012-01-20 DIAGNOSIS — Z1231 Encounter for screening mammogram for malignant neoplasm of breast: Secondary | ICD-10-CM | POA: Insufficient documentation

## 2012-01-26 ENCOUNTER — Other Ambulatory Visit: Payer: Self-pay | Admitting: Family Medicine

## 2012-01-26 DIAGNOSIS — R928 Other abnormal and inconclusive findings on diagnostic imaging of breast: Secondary | ICD-10-CM

## 2012-02-09 ENCOUNTER — Other Ambulatory Visit: Payer: Medicare Other

## 2012-02-13 ENCOUNTER — Other Ambulatory Visit: Payer: Self-pay | Admitting: Family Medicine

## 2012-02-13 ENCOUNTER — Ambulatory Visit
Admission: RE | Admit: 2012-02-13 | Discharge: 2012-02-13 | Disposition: A | Discharge status: Home / Self Care | Payer: Medicare Other | Source: Ambulatory Visit | Attending: Family Medicine | Admitting: Family Medicine

## 2012-02-13 DIAGNOSIS — R928 Other abnormal and inconclusive findings on diagnostic imaging of breast: Secondary | ICD-10-CM

## 2012-02-13 DIAGNOSIS — C50919 Malignant neoplasm of unspecified site of unspecified female breast: Secondary | ICD-10-CM

## 2012-02-13 HISTORY — DX: Malignant neoplasm of unspecified site of unspecified female breast: C50.919

## 2012-02-13 HISTORY — PX: BREAST BIOPSY: SHX20

## 2012-02-16 ENCOUNTER — Ambulatory Visit
Admission: RE | Admit: 2012-02-16 | Discharge: 2012-02-16 | Disposition: A | Discharge status: Home / Self Care | Payer: Medicare Other | Source: Ambulatory Visit | Attending: Family Medicine | Admitting: Family Medicine

## 2012-02-16 ENCOUNTER — Telehealth: Payer: Self-pay | Admitting: *Deleted

## 2012-02-16 ENCOUNTER — Other Ambulatory Visit: Payer: Self-pay | Admitting: Family Medicine

## 2012-02-16 DIAGNOSIS — R928 Other abnormal and inconclusive findings on diagnostic imaging of breast: Secondary | ICD-10-CM

## 2012-02-16 DIAGNOSIS — C50911 Malignant neoplasm of unspecified site of right female breast: Secondary | ICD-10-CM

## 2012-02-16 DIAGNOSIS — C50419 Malignant neoplasm of upper-outer quadrant of unspecified female breast: Secondary | ICD-10-CM | POA: Insufficient documentation

## 2012-02-16 NOTE — Telephone Encounter (Signed)
Dr. Glennon Mac called from [place] requesting for information to be given on [facility] today.  Confirmed appt for 02/25/11 at 1200.  Contact information and instructions given.  Pt denies further needs at this time.

## 2012-02-18 DIAGNOSIS — Z923 Personal history of irradiation: Secondary | ICD-10-CM

## 2012-02-18 HISTORY — DX: Personal history of irradiation: Z92.3

## 2012-02-20 ENCOUNTER — Ambulatory Visit
Admission: RE | Admit: 2012-02-20 | Discharge: 2012-02-20 | Disposition: A | Discharge status: Home / Self Care | Payer: Medicare Other | Source: Ambulatory Visit | Attending: Family Medicine | Admitting: Family Medicine

## 2012-02-20 DIAGNOSIS — C50911 Malignant neoplasm of unspecified site of right female breast: Secondary | ICD-10-CM

## 2012-02-20 MED ORDER — GADOBENATE DIMEGLUMINE 529 MG/ML IV SOLN
20.0000 mL | Freq: Once | INTRAVENOUS | Status: AC | PRN
  Administered 2012-02-20: 20 mL via INTRAVENOUS

## 2012-02-25 ENCOUNTER — Ambulatory Visit: Payer: Medicare Other

## 2012-02-25 ENCOUNTER — Ambulatory Visit (HOSPITAL_BASED_OUTPATIENT_CLINIC_OR_DEPARTMENT_OTHER): Payer: Medicare Other | Admitting: Oncology

## 2012-02-25 ENCOUNTER — Encounter: Payer: Self-pay | Admitting: *Deleted

## 2012-02-25 ENCOUNTER — Ambulatory Visit (HOSPITAL_BASED_OUTPATIENT_CLINIC_OR_DEPARTMENT_OTHER): Payer: Medicare Other | Admitting: Surgery

## 2012-02-25 ENCOUNTER — Encounter: Payer: Self-pay | Admitting: Specialist

## 2012-02-25 ENCOUNTER — Other Ambulatory Visit (HOSPITAL_BASED_OUTPATIENT_CLINIC_OR_DEPARTMENT_OTHER): Payer: Medicare Other

## 2012-02-25 ENCOUNTER — Ambulatory Visit
Admission: RE | Admit: 2012-02-25 | Discharge: 2012-02-25 | Disposition: A | Discharge status: Home / Self Care | Payer: Medicare Other | Source: Ambulatory Visit | Attending: Radiation Oncology | Admitting: Radiation Oncology

## 2012-02-25 ENCOUNTER — Ambulatory Visit: Payer: Medicare Other | Admitting: Physical Therapy

## 2012-02-25 ENCOUNTER — Encounter (INDEPENDENT_AMBULATORY_CARE_PROVIDER_SITE_OTHER): Payer: Self-pay | Admitting: Surgery

## 2012-02-25 ENCOUNTER — Encounter: Payer: Self-pay | Admitting: Radiation Oncology

## 2012-02-25 VITALS — BP 173/74 | HR 102 | Temp 98.1°F | Resp 20 | Ht 67.0 in | Wt 340.8 lb

## 2012-02-25 VITALS — BP 173/74 | HR 102 | Temp 98.1°F | Resp 20 | Ht 64.3 in | Wt 341.0 lb

## 2012-02-25 DIAGNOSIS — C50419 Malignant neoplasm of upper-outer quadrant of unspecified female breast: Secondary | ICD-10-CM

## 2012-02-25 DIAGNOSIS — D0590 Unspecified type of carcinoma in situ of unspecified breast: Secondary | ICD-10-CM

## 2012-02-25 DIAGNOSIS — Z170 Estrogen receptor positive status [ER+]: Secondary | ICD-10-CM

## 2012-02-25 LAB — CBC WITH DIFFERENTIAL/PLATELET
BASO%: 0.8 % (ref 0.0–2.0)
Basophils Absolute: 0.1 10e3/uL (ref 0.0–0.1)
EOS%: 2.7 % (ref 0.0–7.0)
Eosinophils Absolute: 0.2 10e3/uL (ref 0.0–0.5)
HCT: 35.9 % (ref 34.8–46.6)
HGB: 11.8 g/dL (ref 11.6–15.9)
LYMPH%: 33.1 % (ref 14.0–49.7)
MCH: 28.6 pg (ref 25.1–34.0)
MCHC: 33 g/dL (ref 31.5–36.0)
MCV: 86.6 fL (ref 79.5–101.0)
MONO#: 0.3 10e3/uL (ref 0.1–0.9)
MONO%: 4.5 % (ref 0.0–14.0)
NEUT#: 3.8 10e3/uL (ref 1.5–6.5)
NEUT%: 58.9 % (ref 38.4–76.8)
Platelets: 171 10e3/uL (ref 145–400)
RBC: 4.14 10e6/uL (ref 3.70–5.45)
RDW: 15.2 % — ABNORMAL HIGH (ref 11.2–14.5)
WBC: 6.4 10e3/uL (ref 3.9–10.3)
lymph#: 2.1 10e3/uL (ref 0.9–3.3)

## 2012-02-25 LAB — COMPREHENSIVE METABOLIC PANEL WITH GFR
ALT: 15 U/L (ref 0–35)
AST: 13 U/L (ref 0–37)
Albumin: 3.4 g/dL — ABNORMAL LOW (ref 3.5–5.2)
Alkaline Phosphatase: 73 U/L (ref 39–117)
BUN: 18 mg/dL (ref 6–23)
CO2: 27 meq/L (ref 19–32)
Calcium: 9.5 mg/dL (ref 8.4–10.5)
Chloride: 99 meq/L (ref 96–112)
Creatinine, Ser: 0.92 mg/dL (ref 0.50–1.10)
Glucose, Bld: 136 mg/dL — ABNORMAL HIGH (ref 70–99)
Potassium: 3.9 meq/L (ref 3.5–5.3)
Sodium: 136 meq/L (ref 135–145)
Total Bilirubin: 0.2 mg/dL — ABNORMAL LOW (ref 0.3–1.2)
Total Protein: 6.4 g/dL (ref 6.0–8.3)

## 2012-02-25 NOTE — Progress Notes (Signed)
I met the patient and her daughter in [facility].  She initially rated her distress as "10" but revised it to "5" after meeting with the physicians here.  She said she had imagined the worst case scenario, since her mother had died from breast cancer.  I encouraged her to take advantage of the support services, including the Breast Cancer Support Group, and I also made a referral for her to [facility].

## 2012-02-25 NOTE — Progress Notes (Signed)
Radiation Oncology         ([phone]) [phone] ________________________________  Initial outpatient Consultation  Name: [name] MRN: [id number]  Date: 02/25/2012  DOB: 1955/05/07  KN:LZJQB,HALPF J, MD  Streck, Autumn Patty, MD   REFERRING PHYSICIAN: Haywood Lasso, MD  DIAGNOSIS: The encounter diagnosis was Cancer of upper-outer quadrant of female breast, right.  HISTORY OF PRESENT ILLNESS::[name] is a 57 y.o. female who is seen out of the courtesy of Dr. Osborn Coho as part of the multidisciplinary breast clinic. Recently the patient palpated abnormality in the upper-outer aspect of her right breast. She did undergo imaging with a worrisome 3 cm lesion located in the right breast at the 10:00 position approximately 10 cm from the nipple area. On ultrasound this area measured 3.0 x 1.5 x 1.2 cm. 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   patient was also noted to have a suspicious lymph node in the right axilla which was biopsied and  returned  benign disease.  MRI of the breast area showed a solitary 2.4 x 1.7 x 1.9 cm lesion in the upper outer quadrant of the right breast which was enhancing. There is no other lesions noted within the chest region. With this information the patient is now seen in the multidisciplinary breast clinic  PREVIOUS RADIATION THERAPY: No  PAST MEDICAL HISTORY:  has a past medical history of Depression; Hypertension; Thyroid disease; GERD (gastroesophageal reflux disease); Pancreatitis; Knee pain, bilateral; Obesity; HSV (herpes simplex virus) infection; Hyperlipemia; Asthma; Tachycardia; Breast mass in female; Sleep apnea; Arthritis; Breast cancer; Anemia; Anxiety; Headache; and Heartburn.    PAST SURGICAL HISTORY: Past Surgical History  Procedure Date  . Replacement total knee bilateral   . Foot surgery   . Abdominal hysterectomy   . Abdominal surgery   . Cholecystectomy   . Breast surgery   . Joint  replacement   . Thyroid surgery     FAMILY HISTORY: family history includes Bone cancer in her mother; Breast cancer in her maternal grandmother and mother; Cancer in her mother and sister; Diabetes in her brother, mother, and sister; Heart disease in her brother and maternal grandmother; and Hypertension in her mother and sister.  SOCIAL HISTORY:  reports that she quit smoking about 19 years ago. Her smoking use included Cigarettes. She has a 10 pack-year smoking history. She has never used smokeless tobacco. She reports that she does not drink alcohol or use illicit drugs.  ALLERGIES: Shellfish allergy; Morphine and related; and Oxycodone hcl  MEDICATIONS:  Current Outpatient Prescriptions  Medication Sig Dispense Refill  . albuterol (PROVENTIL HFA;VENTOLIN HFA) 108 (90 BASE) MCG/ACT inhaler Inhale 2 puffs into the lungs every 6 (six) hours as needed. For wheezing      . ARIPiprazole (ABILIFY) 2 MG tablet Take 2 mg by mouth daily.      . budesonide-formoterol (SYMBICORT) 80-4.5 MCG/ACT inhaler Inhale 2 puffs into the lungs 2 (two) times daily.      [name] buPROPion (WELLBUTRIN SR) 150 MG 12 hr tablet Take 450 mg by mouth daily.      . cyclobenzaprine (FLEXERIL) 10 MG tablet Take 10 mg by mouth 3 (three) times daily as needed. For muscle spasms      . esomeprazole (NEXIUM) 40 MG capsule Take 40 mg by mouth daily before breakfast.      . HYDROcodone-acetaminophen (LORTAB) 10-500 MG per tablet Take 1 tablet by mouth every 4 (four) hours as needed. For pain      .  levothyroxine (SYNTHROID, LEVOTHROID) 200 MCG tablet Take 1 tablet (200 mcg total) by mouth daily before breakfast.  30 tablet  3  . LORazepam (ATIVAN) 1 MG tablet Take 1 mg by mouth every 8 (eight) hours as needed. For anxiety      . meloxicam (MOBIC) 15 MG tablet Take 15 mg by mouth daily.      [name] PARoxetine (PAXIL) 20 MG tablet Take 20 mg by mouth every morning.      . pravastatin (PRAVACHOL) 80 MG tablet Take 80 mg by mouth daily.        . QUEtiapine (SEROQUEL) 300 MG tablet Take 900 mg by mouth at bedtime.      . traZODone (DESYREL) 100 MG tablet Take 100 mg by mouth at bedtime.      . valACYclovir (VALTREX) 1000 MG tablet Take 1,000 mg by mouth daily.      [name] zolpidem (AMBIEN) 10 MG tablet Take 10 mg by mouth at bedtime as needed. For sleep        REVIEW OF SYSTEMS:  A 15 point review of systems is documented in the electronic medical record. This was obtained by the nursing staff. However, I reviewed this with the patient to discuss relevant findings and make appropriate changes.  As above the patient palpated abnormality on self exam. She also palpated a swelling in her right axillary region. She denies any pain in the breast area nipple discharge or bleeding prior to diagnosis. She denies any problems with swelling in her right arm or hand.   PHYSICAL EXAM: This is a pleasant 57 year old female in no acute distress. She is accompanied by her daughter on evaluation today. Patient is sitting in a wheelchair during most of the evaluation. With my assistance she was barely able to get up on the examination table. Examination of the neck and supraclavicular region reveals no evidence of adenopathy. The axillary areas show swelling in the right axillary region which somewhat soft.  Examination of the left axilla is benign. The left breast is large and pendulous without obvious mass nipple discharge or bleeding. Examination right breast reveals a large bluish birthmark in the medial aspect of the breast. In the upper outer quadrant of the breast there is a ~ 2 cm palpable mass. This is somewhat tender with palpation likely related to her recent biopsy.   LABORATORY DATA:  Lab Results  Component Value Date   WBC 6.4 02/25/2012   HGB 11.8 02/25/2012   HCT 35.9 02/25/2012   MCV 86.6 02/25/2012   PLT 171 02/25/2012   Lab Results  Component Value Date   NA 136 02/25/2012   K 3.9 02/25/2012   CL 99 02/25/2012   CO2 27 02/25/2012   Lab Results   Component Value Date   ALT 15 02/25/2012   AST 13 02/25/2012   ALKPHOS 73 02/25/2012   BILITOT 0.2* 02/25/2012     RADIOGRAPHY: US Breast Right  02/13/2012  *RADIOLOGY REPORT*  Clinical Data:  Recall from screening mammography.  DIGITAL DIAGNOSTIC RIGHT BREAST MAMMOGRAM  AND RIGHT BREAST ULTRASOUND:  Comparison:  01/20/2012, 10/09/2010, 09/07/2008, 06/29/2006.  Findings:  ACR Breast Density Category 2: There is a scattered fibroglandular pattern.  Spot compression views of the right breast demonstrate an ill- defined area of increased density located within the upper-outer quadrant of the right breast.  On physical exam, there is a firm palpable mass located within the right breast at 10 o'clock position approximately 10 cm from the nipple which by  physical examination measures approximately 2 cm in size.  Ultrasound is performed, showing an irregularly marginated hypoechoic mass located within the right breast at the 10 o'clock position 10 cm from the nipple corresponding to the mammographic finding.  This measures 3.0 x 1.5 x 1.2 cm in size and is worrisome mass.  In addition, there are two adjacent low axillary (level I) lymph nodes present with loss of the normal fatty hilum.  These are relatively small and each measure approximately 5-6 mm in size. However, the lack of hilar fat is suspicious and tissue sampling is recommended.  I have discussed ultrasound-guided core biopsy of the right breast mass and abnormal appearing right axillary lymph node with the patient.  She would like to proceed with this.  This will be performed and reported separately.  IMPRESSION:  1.  3 cm worrisome mass located within the right breast at 10 o'clock position 10 cm  from the nipple.  Tissue sampling is recommended and ultrasound-guided core biopsy will be performed and reported separately. 2.  Several adjacent normal-appearing low axillary right axillary lymph nodes.  Tissue sampling is recommended and ultrasound-guided core  biopsy will be performed.  RECOMMENDATION: Right breast ultrasound guided core biopsy and ultrasound-guided core biopsy of abnormal appearing right axillary lymph node.  I have discussed the findings and recommendations with the patient. Results were also provided in writing at the conclusion of the visit.  BI-RADS CATEGORY 4:  Suspicious abnormality - biopsy should be considered.   Original Report Authenticated By: Altamese Cabal, M.D.    Mr Breast Bilateral W Wo Contrast  02/20/2012  *RADIOLOGY REPORT*  Clinical Data: Recent diagnosis of right breast DCIS following right breast ultrasound guided core needle biopsy.  A right axillary lymph node biopsied showed no evidence of metastatic disease, and normal lymph node tissue.  BUN and creatinine were obtained on site at [facility] at [address] Results:  BUN 17 mg/dL,  Creatinine 1.0 mg/dL.  BILATERAL BREAST MRI WITH AND WITHOUT CONTRAST  Technique: Multiplanar, multisequence MR images of both breasts were obtained prior to and following the intravenous administration of 77m of Multihance.  Three dimensional images were evaluated at the independent DynaCad workstation.  Comparison:  Bilateral screening mammogram 01/20/2012, diagnostic right mammogram right breast ultrasound 02/13/2012 and, post clip right mammogram 02/13/2012  Findings: In the middle third of the upper outer quadrant of the right breast is an irregular markedly enhancing mass with washout kinetics that measures 2.4 x 1.7 x 1.9 cm.  Biopsy clip artifact is seen within the center of the mass.  No additional suspicious areas enhance are seen in the right breast.  No mass or suspicious enhancement is identified in the left breast to suggest malignancy.  No axillary or internal mammary chain lymphadenopathy is identified.  IMPRESSION: 1. Solitary 2.4 x 1.7 x 1.9 cm right upper outer quadrant enhancing mass with central biopsy clip artifact corresponds to biopsy-proven ductal  carcinoma in situ. 2.  No MRI evidence of malignancy in the left breast.  RECOMMENDATION: Treatment planning of the right breast.  THREE-DIMENSIONAL MR IMAGE RENDERING ON INDEPENDENT WORKSTATION:  Three-dimensional MR images were rendered by post-processing of the original MR data on an independent workstation.  The three- dimensional MR images were interpreted, and findings were reported in the accompanying complete MRI report for this study.  BI-RADS CATEGORY 6:  Known biopsy-proven malignancy - appropriate action should be taken.   Original Report Authenticated By: SCurlene Dolphin M.D.  [place] Core Biopsy  02/13/2012  *RADIOLOGY REPORT*  Clinical Data:  Abnormal appearing right axillary lymph node.  ULTRASOUND GUIDED CORE BIOPSY OF THE ABNORMAL APPEARING RIGHT AXILLARY LYMPH NODE.  Comparison: Previous exams.  I met with the patient and we discussed the procedure of ultrasound- guided biopsy, including benefits and alternatives.  We discussed the high likelihood of a successful procedure. We discussed the risks of the procedure, including infection, bleeding, tissue injury, clip migration, and inadequate sampling.  Informed written consent was given.  Using sterile technique 2% lidocaine, ultrasound guidance and a 14 gauge automated biopsy device, biopsy was performed of the abnormal appearing right axillary lymph node using a inferior/lateral approach. No clip was placed.  IMPRESSION: Ultrasound guided biopsy of abnormal appearing right axillary lymph node as discussed above.  No apparent complications.   Original Report Authenticated By: Altamese Cabal, M.D.    [place] Core Biopsy  02/13/2012  *RADIOLOGY REPORT*  Clinical Data:  Right breast mass.  ULTRASOUND GUIDED CORE BIOPSY OF THE right BREAST  Comparison: Previous exams.  I met with the patient and we discussed the procedure of ultrasound- guided biopsy, including benefits and alternatives.  We discussed the high likelihood of a successful procedure. We  discussed the risks of the procedure, including infection, bleeding, tissue injury, clip migration, and inadequate sampling.  Informed written consent was given.  Using sterile technique 2% lidocaine, ultrasound guidance and a 14 gauge automated biopsy device, biopsy was performed of the mass located within the right breast at the 10 o'clock position using a inferior/lateral approach.  At the conclusion of the procedure a ribbon shaped tissue marker clip was deployed into the biopsy cavity.  Follow up 2 view mammogram was performed and dictated separately.  IMPRESSION: Ultrasound guided biopsy of the right breast mass located at 10 o'clock position as discussed above.  No apparent complications.   Original Report Authenticated By: Altamese Cabal, M.D.    [place] R  02/13/2012  *RADIOLOGY REPORT*  Clinical Data:  Recall from screening mammography.  DIGITAL DIAGNOSTIC RIGHT BREAST MAMMOGRAM  AND RIGHT BREAST ULTRASOUND:  Comparison:  01/20/2012, 10/09/2010, 09/07/2008, 06/29/2006.  Findings:  ACR Breast Density Category 2: There is a scattered fibroglandular pattern.  Spot compression views of the right breast demonstrate an ill- defined area of increased density located within the upper-outer quadrant of the right breast.  On physical exam, there is a firm palpable mass located within the right breast at 10 o'clock position approximately 10 cm from the nipple which by physical examination measures approximately 2 cm in size.  Ultrasound is performed, showing an irregularly marginated hypoechoic mass located within the right breast at the 10 o'clock position 10 cm from the nipple corresponding to the mammographic finding.  This measures 3.0 x 1.5 x 1.2 cm in size and is worrisome mass.  In addition, there are two adjacent low axillary (level I) lymph nodes present with loss of the normal fatty hilum.  These are relatively small and each measure approximately 5-6 mm in size. However, the lack of hilar  fat is suspicious and tissue sampling is recommended.  I have discussed ultrasound-guided core biopsy of the right breast mass and abnormal appearing right axillary lymph node with the patient.  She would like to proceed with this.  This will be performed and reported separately.  IMPRESSION:  1.  3 cm worrisome mass located within the right breast at 10 o'clock position 10 cm  from the nipple.  Tissue sampling  is recommended and ultrasound-guided core biopsy will be performed and reported separately. 2.  Several adjacent normal-appearing low axillary right axillary lymph nodes.  Tissue sampling is recommended and ultrasound-guided core biopsy will be performed.  RECOMMENDATION: Right breast ultrasound guided core biopsy and ultrasound-guided core biopsy of abnormal appearing right axillary lymph node.  I have discussed the findings and recommendations with the patient. Results were also provided in writing at the conclusion of the visit.  BI-RADS CATEGORY 4:  Suspicious abnormality - biopsy should be considered.   Original Report Authenticated By: Altamese Cabal, M.D.    [facility] Digital Diagnostic Unil[facility] R  02/13/2012  *RADIOLOGY REPORT*  Clinical Data:  Post right breast ultrasound guided core biopsy.  DIGITAL DIAGNOSTIC RIGHT BREAST MAMMOGRAM  Comparison:  Previous exams.  Findings:  Films are performed following ultrasound guided biopsy of the mass located within the right breast at the 10 o'clock position.  Ribbon shaped clip is in appropriate position.  IMPRESSION: Appropriate positioning of clip following right breast ultrasound guided core biopsy.   Original Report Authenticated By: Altamese Cabal, M.D.    [facility]  02/16/2012  *RADIOLOGY REPORT*  ESTABLISHED PATIENT OFFICE VISIT - LEVEL II [phone])  Chief Complaint:  The patient returns for results following right breast ultrasound guided core biopsies.  History:  The patient was recalled from screening mammography. Additional  views demonstrated an irregular density within the upper- outer quadrant of the right breast and ultrasound-guided core biopsy was performed.  Exam:  There is mild ecchymosis associated with the right breast and axillary lymph node ultrasound-guided core biopsy sites. There is no hematoma formation or signs of infection.  Pathology: The right breast biopsy located at the 10 o'clock position demonstrated DCIS.  The right axillary lymph node biopsy demonstrated no evidence for metastatic disease and normal lymph nodal tissue. The pathology is concordant with the imaging findings.  Assessment and Plan:  The pathology findings were discussed with the patient and her questions were answered.  The patient will be scheduled for the breast cancer multidisciplinary clinic on 02/25/2012.  Breast MRI is planned for 02/20/2012.  Post biopsy wound care instructions were reviewed with the patient. Educational materials were also given to the patient.  The patient was encouraged to call the Breast Center for additional questions or concerns.   Original Report Authenticated By: Altamese Cabal, M.D.       IMPRESSION:  57 y.o. 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 A right axillary lymph node biopsied on the same date was benign.  The patient is interested in breast conserving therapy and would be a candidate for partial mastectomy.  In light of the patient's performance status I am unsure whether she would be a candidate for radiation therapy as part of her overall management.  She may be a candidate for adjuvant hormonal therapy if she is unable to proceed with radiation therapy.  PLAN: Patient will proceed with partial mastectomy under the direction of Dr. Osborn Coho. She will be seen in the postoperative setting for discussion of radiation therapy as her management       ------------------------------------------------    Blair Promise, PhD, MD

## 2012-02-25 NOTE — Progress Notes (Signed)
Patient ID: [name], female   DOB: 09/18/1955, 56 y.o.   MRN: [id number]  Chief Complaint  Patient presents with  . Breast Cancer    right    HPI [name] is a 56 y.o. female.  She recently found an abnormal area to palpation in the upper outer quadrant of the right breast was also having some discomfort in the right axilla. She is seen at the breast Center and evaluation has shown a right breast cancer in the upper outer quadrant. 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 A biopsy of an axillary lymph node was negative. The tumor was strongly ER and PR positive. She's had a prior left breast biopsy for what appeared to be fat necrosis. She is having no other breast problems or symptoms. HPI  Past Medical History  Diagnosis Date  . Depression   . Hypertension   . Thyroid disease   . GERD (gastroesophageal reflux disease)   . Pancreatitis     hx of  . Knee pain, bilateral   . Obesity   . HSV (herpes simplex virus) infection   . Hyperlipemia   . Asthma   . Tachycardia     with sx in 2008  . Breast mass in female     L breast 2008, US showed likely fat necrosis  . Sleep apnea   . Arthritis   . Breast cancer   . Anemia   . Anxiety   . Headache   . Heartburn     Past Surgical History  Procedure Date  . Replacement total knee bilateral   . Foot surgery   . Abdominal hysterectomy   . Abdominal surgery   . Cholecystectomy   . Breast surgery   . Joint replacement   . Thyroid surgery     Family History  Problem Relation Age of Onset  . Heart disease Brother   . Diabetes Brother   . Hypertension Mother   . Diabetes Mother   . Cancer Mother   . Breast cancer Mother   . Bone cancer Mother   . Hypertension Sister   . Diabetes Sister   . Cancer Sister   . Breast cancer Maternal Grandmother   . Heart disease Maternal Grandmother     Social History History  Substance Use Topics  . Smoking status: Former Smoker -- 1.0 packs/day for 10 years   Types: Cigarettes    Quit date: 03/24/1992  . Smokeless tobacco: Never Used  . Alcohol Use: No    Allergies  Allergen Reactions  . Shellfish Allergy Shortness Of Breath  . Morphine And Related Other (See Comments)    Overly sedated  . Oxycodone Hcl     REACTION: hives    Current Outpatient Prescriptions  Medication Sig Dispense Refill  . albuterol (PROVENTIL HFA;VENTOLIN HFA) 108 (90 BASE) MCG/ACT inhaler Inhale 2 puffs into the lungs every 6 (six) hours as needed. For wheezing      . ARIPiprazole (ABILIFY) 2 MG tablet Take 2 mg by mouth daily.      . budesonide-formoterol (SYMBICORT) 80-4.5 MCG/ACT inhaler Inhale 2 puffs into the lungs 2 (two) times daily.      . buPROPion (WELLBUTRIN SR) 150 MG 12 hr tablet Take 450 mg by mouth daily.      . cyclobenzaprine (FLEXERIL) 10 MG tablet Take 10 mg by mouth 3 (three) times daily as needed. For muscle spasms      . esomeprazole (NEXIUM) 40 MG capsule Take   40 mg by mouth daily before breakfast.      . HYDROcodone-acetaminophen (LORTAB) 10-500 MG per tablet Take 1 tablet by mouth every 4 (four) hours as needed. For pain      . levothyroxine (SYNTHROID, LEVOTHROID) 200 MCG tablet Take 1 tablet (200 mcg total) by mouth daily before breakfast.  30 tablet  3  . LORazepam (ATIVAN) 1 MG tablet Take 1 mg by mouth every 8 (eight) hours as needed. For anxiety      . meloxicam (MOBIC) 15 MG tablet Take 15 mg by mouth daily.      [name] PARoxetine (PAXIL) 20 MG tablet Take 20 mg by mouth every morning.      . pravastatin (PRAVACHOL) 80 MG tablet Take 80 mg by mouth daily.      . QUEtiapine (SEROQUEL) 300 MG tablet Take 900 mg by mouth at bedtime.      . traZODone (DESYREL) 100 MG tablet Take 100 mg by mouth at bedtime.      . valACYclovir (VALTREX) 1000 MG tablet Take 1,000 mg by mouth daily.      [name] zolpidem (AMBIEN) 10 MG tablet Take 10 mg by mouth at bedtime as needed. For sleep        Review of Systems Review of Systems  Constitutional: Positive for  fatigue. Negative for fever, chills and unexpected weight change.  HENT: Positive for sinus pressure and tinnitus. Negative for hearing loss, congestion, sore throat, trouble swallowing and voice change.   Eyes: Negative for visual disturbance.  Respiratory: Positive for cough and shortness of breath. Negative for wheezing.   Cardiovascular: Positive for palpitations. Negative for chest pain and leg swelling.  Gastrointestinal: Negative for nausea, vomiting, abdominal pain, diarrhea, constipation, blood in stool, abdominal distention and anal bleeding.       Heartrburn  Genitourinary: Negative for hematuria, vaginal bleeding and difficulty urinating.  Musculoskeletal: Positive for back pain, joint swelling and arthralgias.  Skin: Negative for rash and wound.  Neurological: Negative for seizures, syncope and headaches.  Hematological: Negative for adenopathy. Does not bruise/bleed easily.  Psychiatric/Behavioral: Negative for confusion. The patient is nervous/anxious.        Depression    Blood pressure 173/74, pulse 102, temperature 98.1 F (36.7 C), resp. rate 20, height 5' 4.3" (1.633 m), weight 341 lb (154.677 kg).  Physical Exam Physical Exam  Vitals reviewed. Constitutional: She is oriented to person, place, and time. She appears well-developed and well-nourished. No distress.  HENT:  Head: Normocephalic and atraumatic.  Mouth/Throat: Oropharynx is clear and moist.  Eyes: Conjunctivae normal and EOM are normal. Pupils are equal, round, and reactive to light. No scleral icterus.  Neck: Normal range of motion. Neck supple. No tracheal deviation present. No thyromegaly present.  Cardiovascular: Normal rate, regular rhythm, normal heart sounds and intact distal pulses.  Exam reveals no gallop and no friction rub.   No murmur heard. Pulmonary/Chest: Effort normal and breath sounds normal. No respiratory distress. She has no wheezes. She has no rales. Bony tenderness: Breast large,  pendulous, large birthmark on right, mass on right UOQ. Right breast exhibits mass and tenderness. Right breast exhibits no inverted nipple, no nipple discharge and no skin change. Left breast exhibits no inverted nipple, no mass, no nipple discharge, no skin change and no tenderness. Breasts are symmetrical.    Abdominal: Soft. Bowel sounds are normal. She exhibits no distension and no mass. There is no tenderness. There is no rebound and no guarding.  Hard to evaluate as VERY large  Musculoskeletal: Normal range of motion. She exhibits no edema and no tenderness.  Neurological: She is alert and oriented to person, place, and time.  Skin: Skin is warm and dry. No rash noted. She is not diaphoretic. No erythema.  Psychiatric: She has a normal mood and affect. Her behavior is normal. Judgment and thought content normal.    Data Reviewed I have reviewed the mammogram and MRI films and reports his radiologist and the pathology slides and reports with the pathologist. I have reviewed the case and discuss it with the medical and radiation oncologist.   Assessment    Clinical stage 0 right breast cancer,upper outer quadrant, arising in a papilloma Morbid obesity Hypertension    Plan    I reviewed the situation and discussed. I think the best option is a right lumpectomy. When pathology report is available we can make decisions about postlumpectomy therapy. The patient has significant risk factors with her morbid obesity hypertension etc. She has the patient's immobility due to knee problems. I think the least surgery we can do the better in the situation.  I have explained the pathophysiology and staging of breast cancer with particular attention to her exact situation. We discussed the multidisciplinary approach to breast cancer which often includes both medical and radiation oncology consultations.  We also discussed surgical options for the treatment of breast cancer including lumpectomy  and mastectomy with possible reconstructive surgery. In addition we talked about the evaluation and management of lymph nodes including a description of sentinel lymph node biopsy and axillary dissections. We reviewed potential complications and risks including bleeding, infection, numbness,  lymphedema, and the potential need for additional surgery.  She understands that for patients who are candidate for lumpectomy or mastectomy there is an equal survival rate with either technique, but a slightly higher local recurrence rate with lumpectomy. In addition she knows that a lumpectomy usually requires postoperative radiation as part of the management of the breast cancer.  We have discussed the likely postoperative course and plans for followup.  I have given the patient some written information that reviewed all of these issues. I believe her questions are answered and that she has a good understanding of the issues.        Cambren Helm J 02/25/2012, 3:09 PM

## 2012-02-25 NOTE — Patient Instructions (Signed)
My office will schedule surgery to remove the right breast cancer from the upper outer part of your right breast. They will call you to confirm the plans. If you have any questions please call my office and asked for my nurse, Luvenia Starch, at [phone]
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
## 2012-02-27 ENCOUNTER — Encounter (HOSPITAL_COMMUNITY): Payer: Self-pay | Admitting: Pharmacy Technician

## 2012-03-02 ENCOUNTER — Telehealth: Payer: Self-pay | Admitting: *Deleted

## 2012-03-02 ENCOUNTER — Encounter (HOSPITAL_COMMUNITY)
Admission: RE | Admit: 2012-03-02 | Discharge: 2012-03-02 | Disposition: A | Discharge status: Home / Self Care | Payer: Medicare Other | Source: Ambulatory Visit | Attending: Surgery | Admitting: Surgery

## 2012-03-02 ENCOUNTER — Encounter (HOSPITAL_COMMUNITY): Payer: Self-pay

## 2012-03-02 ENCOUNTER — Encounter: Payer: Self-pay | Admitting: *Deleted

## 2012-03-02 DIAGNOSIS — C50419 Malignant neoplasm of upper-outer quadrant of unspecified female breast: Secondary | ICD-10-CM

## 2012-03-02 HISTORY — DX: Calculus of kidney: N20.0

## 2012-03-02 LAB — BASIC METABOLIC PANEL
CO2: 24 mEq/L (ref 19–32)
Calcium: 8.8 mg/dL (ref 8.4–10.5)
Creatinine, Ser: 0.9 mg/dL (ref 0.50–1.10)
Glucose, Bld: 123 mg/dL — ABNORMAL HIGH (ref 70–99)

## 2012-03-02 LAB — CBC
MCH: 27.9 pg (ref 26.0–34.0)
MCV: 86.2 fL (ref 78.0–100.0)
Platelets: 185 10*3/uL (ref 150–400)
RDW: 14.9 % (ref 11.5–15.5)

## 2012-03-02 MED ORDER — DEXTROSE 5 % IV SOLN
3.0000 g | INTRAVENOUS | Status: AC
  Administered 2012-03-03: 3 g via INTRAVENOUS
  Filled 2012-03-02: qty 3000

## 2012-03-02 MED ORDER — CHLORHEXIDINE GLUCONATE 4 % EX LIQD: 1.0000 "application " | Freq: Once | CUTANEOUS | Status: DC

## 2012-03-02 NOTE — Pre-Procedure Instructions (Signed)
[name]  03/02/2012   Your procedure is scheduled on:  Wednesday March 03, 2012  Report to [facility] at 6:30 AM.  Call this number if you have problems the morning of surgery: [phone]   Remember:   Do not eat food or drink liquids after midnight.   Take these medicines the morning of surgery with A SIP OF WATER: albuterol (bring day of surgery), amlodipine, symbicort, wellbutrin, nexium, hydrocodone, synthroid, ativan, paxil, abilify. DISCONTINUE ASPIRIN, COUMADIN, PLAVIX, EFFIENT AND HERBAL MEDICATIONS   Do not wear jewelry, make-up or nail polish.  Do not wear lotions, powders, or perfumes.  Do not shave 48 hours prior to surgery.   Do not bring valuables to the hospital.  Contacts, dentures or bridgework may not be worn into surgery.  Leave suitcase in the car. After surgery it may be brought to your room.  For patients admitted to the hospital, checkout time is 11:00 AM the day of  discharge.   Patients discharged the day of surgery will not be allowed to drive  home.  Name and phone number of your driver: family/ friend  Special Instructions: Shower using CHG 2 nights before surgery and the night before surgery.  If you shower the day of surgery use CHG.  Use special wash - you have one bottle of CHG for all showers.  You should use approximately 1/3 of the bottle for each shower.   Please read over the following fact sheets that you were given: Pain Booklet, Coughing and Deep Breathing, MRSA Information and Surgical Site Infection Prevention

## 2012-03-02 NOTE — Telephone Encounter (Signed)
Gave patient appointment for 04-2012 lab one week before

## 2012-03-02 NOTE — Telephone Encounter (Signed)
Spoke to pt concerning [name] from 02/25/12. Pt denies questions or concerns regarding dx or treatment care plan.  Confirmed surgery date for 03/03/12.  Informed pt that she will be hearing from the med/rad onc schedulers for f/u appts with Drs. Magrinat and Kinard.  Received verbal understanding.  Encourage pt to call with further needs.  Contact information given.

## 2012-03-02 NOTE — Consult Note (Signed)
Anesthesia Consult:  Patient is a 57 year old female scheduled for a right lumpectomy for breast cancer by Dr. Margot Chimes on 03/03/12 @ 0730.  Her PAT appointment was on 03/02/12 @ 1500.  History includes former smoker, morbid obesity (BMI 56.6), tachycardia, hypertension, obstructive sleep apnea with CPAP use, anemia, asthma, GERD, hyperlipidemia, depression, anxiety, gallstone pancreatitis s/p cholecystectomy, arthritis, bilateral knee replacements, hysterectomy, thyroid surgery.  PCP is Dr. Lucianne Lei.  Labs from 02/25/12 and 03/02/12 noted.  CXR on 06/03/11 showed: Stable chest radiograph. Chronic interstitial prominence and borderline cardiomegaly. No acute findings identified.  Echo on 06/04/11 showed: Left ventricle: The cavity size was normal. Systolic function was normal. The estimated ejection fraction was in the range of 55% to 60%. Wall motion was normal; there were no regional wall motion abnormalities.  EKG on 06/04/11 showed NSR, low voltage QRS.  She denies SOB at rest, but does have chronic DOE.  Her activity is limited due to her obesity and back and knee pain.  She has no chest pain or new edema.  She denies any recent acute respiratory infections or asthma flairs.  She does use her inhaler 3-4 X/week--typically after activity if she feels short of breath.  This has not changed in years.  She tolerated GA with her previous TKA surgeries, last on 11/07/09.  Anesthesia records printed and placed on chart.    Exam shows a pleasant, black female in NAD. No conversational dyspnea.  She is obese and sitting in a wheelchair.  Neck is large.  Heart RRR, lungs clear.  Legs are large, but no pitting edema noted.    She is morbidly obese with OSA.  Pulmonary complications will likely be her highest risk, and she may require post-operative admission/observation.  Fortunately, she feels at her baseline.  She has tolerated GA in the past.  She will be evaluated by her assigned anesthesiologist on the day  of surgery to determine the definitive anesthesia plan.    Of note, she wants to talk with Dr. Margot Chimes further about the surgical plan.  She has discomfort in her right axilla that he is aware of, but never said for sure if he is planning any type of biopsy, etc at the time of her lumpectomy.  I told her she could talk with him in the morning before OR.  Myra Gianotti, PA-C 03/02/12 1630

## 2012-03-03 ENCOUNTER — Ambulatory Visit (HOSPITAL_COMMUNITY)
Admission: RE | Admit: 2012-03-03 | Discharge: 2012-03-03 | Disposition: A | Discharge status: Home / Self Care | Payer: Medicare Other | Source: Ambulatory Visit | Attending: Surgery | Admitting: Surgery

## 2012-03-03 ENCOUNTER — Encounter (HOSPITAL_COMMUNITY): Payer: Self-pay | Admitting: Vascular Surgery

## 2012-03-03 ENCOUNTER — Encounter (HOSPITAL_COMMUNITY)
Admission: RE | Disposition: A | Discharge status: Home / Self Care | Payer: Self-pay | Source: Ambulatory Visit | Attending: Surgery

## 2012-03-03 ENCOUNTER — Encounter (HOSPITAL_COMMUNITY): Payer: Self-pay | Admitting: *Deleted

## 2012-03-03 ENCOUNTER — Ambulatory Visit (HOSPITAL_COMMUNITY): Payer: Medicare Other | Admitting: Vascular Surgery

## 2012-03-03 DIAGNOSIS — D0590 Unspecified type of carcinoma in situ of unspecified breast: Secondary | ICD-10-CM | POA: Insufficient documentation

## 2012-03-03 DIAGNOSIS — I10 Essential (primary) hypertension: Secondary | ICD-10-CM | POA: Insufficient documentation

## 2012-03-03 DIAGNOSIS — K219 Gastro-esophageal reflux disease without esophagitis: Secondary | ICD-10-CM | POA: Insufficient documentation

## 2012-03-03 DIAGNOSIS — Z01812 Encounter for preprocedural laboratory examination: Secondary | ICD-10-CM | POA: Insufficient documentation

## 2012-03-03 DIAGNOSIS — J45909 Unspecified asthma, uncomplicated: Secondary | ICD-10-CM | POA: Insufficient documentation

## 2012-03-03 DIAGNOSIS — E039 Hypothyroidism, unspecified: Secondary | ICD-10-CM | POA: Insufficient documentation

## 2012-03-03 DIAGNOSIS — G4730 Sleep apnea, unspecified: Secondary | ICD-10-CM | POA: Insufficient documentation

## 2012-03-03 DIAGNOSIS — C50419 Malignant neoplasm of upper-outer quadrant of unspecified female breast: Secondary | ICD-10-CM

## 2012-03-03 HISTORY — PX: BREAST LUMPECTOMY: SHX2

## 2012-03-03 SURGERY — BREAST LUMPECTOMY
Anesthesia: General | Site: Breast | Laterality: Right | Wound class: Clean

## 2012-03-03 MED ORDER — MIDAZOLAM HCL 5 MG/5ML IJ SOLN
INTRAMUSCULAR | Status: DC | PRN
  Administered 2012-03-03: 2 mg via INTRAVENOUS

## 2012-03-03 MED ORDER — ONDANSETRON HCL 4 MG/2ML IJ SOLN
INTRAMUSCULAR | Status: DC | PRN
  Administered 2012-03-03: 4 mg via INTRAVENOUS

## 2012-03-03 MED ORDER — SUCCINYLCHOLINE CHLORIDE 20 MG/ML IJ SOLN
INTRAMUSCULAR | Status: DC | PRN
  Administered 2012-03-03: 140 mg via INTRAVENOUS

## 2012-03-03 MED ORDER — PROPOFOL 10 MG/ML IV BOLUS
INTRAVENOUS | Status: DC | PRN
  Administered 2012-03-03: 200 mg via INTRAVENOUS

## 2012-03-03 MED ORDER — HYDROCODONE-ACETAMINOPHEN 5-325 MG PO TABS
ORAL_TABLET | ORAL | Status: AC
  Administered 2012-03-03: 1
  Filled 2012-03-03: qty 1

## 2012-03-03 MED ORDER — LACTATED RINGERS IV SOLN
INTRAVENOUS | Status: DC | PRN
  Administered 2012-03-03: 08:00:00 via INTRAVENOUS

## 2012-03-03 MED ORDER — FENTANYL CITRATE 0.05 MG/ML IJ SOLN
INTRAMUSCULAR | Status: AC
  Filled 2012-03-03: qty 2

## 2012-03-03 MED ORDER — BUPIVACAINE HCL (PF) 0.25 % IJ SOLN
INTRAMUSCULAR | Status: DC | PRN
  Administered 2012-03-03: 30 mL

## 2012-03-03 MED ORDER — HYDROCODONE-ACETAMINOPHEN 5-325 MG PO TABS: 1.0000 | ORAL_TABLET | ORAL | Status: DC | PRN

## 2012-03-03 MED ORDER — HYDROMORPHONE HCL PF 1 MG/ML IJ SOLN
INTRAMUSCULAR | Status: AC
  Filled 2012-03-03: qty 1

## 2012-03-03 MED ORDER — FENTANYL CITRATE 0.05 MG/ML IJ SOLN
INTRAMUSCULAR | Status: DC | PRN
  Administered 2012-03-03: 100 ug via INTRAVENOUS

## 2012-03-03 MED ORDER — LIDOCAINE HCL (CARDIAC) 20 MG/ML IV SOLN
INTRAVENOUS | Status: DC | PRN
  Administered 2012-03-03: 80 mg via INTRAVENOUS

## 2012-03-03 MED ORDER — BUPIVACAINE HCL (PF) 0.25 % IJ SOLN
INTRAMUSCULAR | Status: AC
  Filled 2012-03-03: qty 30

## 2012-03-03 MED ORDER — FENTANYL CITRATE 0.05 MG/ML IJ SOLN
25.0000 ug | INTRAMUSCULAR | Status: DC | PRN
  Administered 2012-03-03 (×2): 50 ug via INTRAVENOUS

## 2012-03-03 MED ORDER — ALBUTEROL SULFATE HFA 108 (90 BASE) MCG/ACT IN AERS
INHALATION_SPRAY | RESPIRATORY_TRACT | Status: DC | PRN
  Administered 2012-03-03 (×2): 2 via RESPIRATORY_TRACT

## 2012-03-03 MED ORDER — LIDOCAINE-EPINEPHRINE (PF) 1 %-1:200000 IJ SOLN
INTRAMUSCULAR | Status: AC
  Filled 2012-03-03: qty 10

## 2012-03-03 SURGICAL SUPPLY — 38 items
ADH SKN CLS APL DERMABOND .7 (GAUZE/BANDAGES/DRESSINGS) ×1
APPLIER CLIP 9.375 MED OPEN (MISCELLANEOUS)
APR CLP MED 9.3 20 MLT OPN (MISCELLANEOUS)
BINDER BREAST XXLRG (GAUZE/BANDAGES/DRESSINGS) ×1 IMPLANT
CANISTER SUCTION 2500CC (MISCELLANEOUS) ×2 IMPLANT
CHLORAPREP W/TINT 26ML (MISCELLANEOUS) ×2 IMPLANT
CLIP APPLIE 9.375 MED OPEN (MISCELLANEOUS) IMPLANT
CLOTH BEACON ORANGE TIMEOUT ST (SAFETY) ×2 IMPLANT
COVER PROBE W GEL 5X96 (DRAPES) IMPLANT
COVER SURGICAL LIGHT HANDLE (MISCELLANEOUS) ×2 IMPLANT
DECANTER SPIKE VIAL GLASS SM (MISCELLANEOUS) ×2 IMPLANT
DERMABOND ADVANCED (GAUZE/BANDAGES/DRESSINGS) ×1
DERMABOND ADVANCED .7 DNX12 (GAUZE/BANDAGES/DRESSINGS) ×1 IMPLANT
DEVICE DUBIN SPECIMEN MAMMOGRA (MISCELLANEOUS) IMPLANT
DRAPE CHEST BREAST 15X10 FENES (DRAPES) ×2 IMPLANT
ELECT CAUTERY BLADE 6.4 (BLADE) ×2 IMPLANT
ELECT REM PT RETURN 9FT ADLT (ELECTROSURGICAL) ×2
ELECTRODE REM PT RTRN 9FT ADLT (ELECTROSURGICAL) ×1 IMPLANT
GLOVE EUDERMIC 7 POWDERFREE (GLOVE) ×2 IMPLANT
GOWN PREVENTION PLUS XLARGE (GOWN DISPOSABLE) ×2 IMPLANT
GOWN STRL NON-REIN LRG LVL3 (GOWN DISPOSABLE) ×2 IMPLANT
KIT BASIN OR (CUSTOM PROCEDURE TRAY) ×2 IMPLANT
KIT MARKER MARGIN INK (KITS) IMPLANT
KIT ROOM TURNOVER OR (KITS) ×2 IMPLANT
NDL HYPO 25GX1X1/2 BEV (NEEDLE) ×1 IMPLANT
NEEDLE HYPO 25GX1X1/2 BEV (NEEDLE) ×2 IMPLANT
NS IRRIG 1000ML POUR BTL (IV SOLUTION) ×2 IMPLANT
PACK GENERAL/GYN (CUSTOM PROCEDURE TRAY) ×2 IMPLANT
PAD ARMBOARD 7.5X6 YLW CONV (MISCELLANEOUS) ×4 IMPLANT
SPONGE LAP 4X18 X RAY DECT (DISPOSABLE) ×2 IMPLANT
STAPLER VISISTAT 35W (STAPLE) ×2 IMPLANT
SUT MNCRL AB 4-0 PS2 18 (SUTURE) ×2 IMPLANT
SUT SILK 2 0 SH (SUTURE) IMPLANT
SUT VIC AB 3-0 SH 18 (SUTURE) ×2 IMPLANT
SYR CONTROL 10ML LL (SYRINGE) ×2 IMPLANT
TOWEL OR 17X24 6PK STRL BLUE (TOWEL DISPOSABLE) ×2 IMPLANT
TOWEL OR 17X26 10 PK STRL BLUE (TOWEL DISPOSABLE) ×2 IMPLANT
WATER STERILE IRR 1000ML POUR (IV SOLUTION) IMPLANT

## 2012-03-03 NOTE — Anesthesia Procedure Notes (Signed)
Procedure Name: Intubation Date/Time: 03/03/2012 8:52 AM Performed by: Ned Grace Pre-anesthesia Checklist: Patient identified, Timeout performed, Emergency Drugs available, Suction available and Patient being monitored Patient Re-evaluated:Patient Re-evaluated prior to inductionOxygen Delivery Method: Circle system utilized Preoxygenation: Pre-oxygenation with 100% oxygen Intubation Type: IV induction and Rapid sequence Grade View: Grade I Tube type: Oral Tube size: 7.0 mm Number of attempts: 1 Airway Equipment and Method: Video-laryngoscopy Placement Confirmation: ETT inserted through vocal cords under direct vision,  positive ETCO2 and CO2 detector Secured at: 22 cm Tube secured with: Tape Dental Injury: Teeth and Oropharynx as per pre-operative assessment

## 2012-03-03 NOTE — Op Note (Signed)
ANNELISE A [facility] 1955/10/26 [id number] 02/26/2012  Preoperative diagnosis: breast cancer, DCIS, right breast, upper outer quadrant  Postoperative diagnosis: the same  Procedure: wire localized excision right breast cancer  Surgeon: Haywood Lasso, MD, FACS   Anesthesia: General   Clinical History and Indications: this patient presented with a palpable right breast mass. 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 In excision with postoperative radiation therapy was recommended.    Description of Procedure: the patient was seen in the preoperative period we confirmed the plans. I marked the right breast. She was taken to the operating room and after satisfactory general endotracheal anesthesia was obtained the timeout was done. I used ultrasound to confirm the location of the mass but it was also readily palpable.  I made a curvilinear incision over the mass and a wide excision with cautery tried to get well around upon palpation. I went down to the chest wall. Grossly I had a wide area of normal fatty looking tissue around the palpable mass. The specimen mammogram was done showing the clip in the middle of the specimen. Inkwas used to mark the margins of the specimen.  I irrigated made sure it was dry. The clips in the marked margins. I closed in layers with 3-0 Vicryl, 4 amount of subcutaneous, Dermabond. 30 cc of 0.25% Marcaine was used to help with postop pain relief.  The patient part of the procedure well. There were no complications. Counts were correct. Blood loss was minimal.  Haywood Lasso, MD, FACS 03/03/2012 9:53 AM

## 2012-03-03 NOTE — Transfer of Care (Signed)
Immediate Anesthesia Transfer of Care Note  Patient: [name]  Procedure(s) Performed: Procedure(s) (LRB) with comments: LUMPECTOMY (Right)  Patient Location: PACU  Anesthesia Type:General  Level of Consciousness: awake, alert , oriented and patient cooperative  Airway & Oxygen Therapy: Patient Spontanous Breathing and Patient connected to face mask oxygen  Post-op Assessment: Report given to PACU RN, Post -op Vital signs reviewed and stable and Patient moving all extremities  Post vital signs: Reviewed and stable  Complications: No apparent anesthesia complications

## 2012-03-03 NOTE — H&P (View-Only) (Signed)
Patient ID: [name], female   DOB: 1956/01/14, 57 y.o.   MRN: [id number]  Chief Complaint  Patient presents with  . Breast Cancer    right    HPI [name] is a 57 y.o. female.  She recently found an abnormal area to palpation in the upper outer quadrant of the right breast was also having some discomfort in the right axilla. She is seen at the breast Center and evaluation has shown a right breast cancer in the upper outer quadrant. 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 A biopsy of an axillary lymph node was negative. The tumor was strongly ER and PR positive. She's had a prior left breast biopsy for what appeared to be fat necrosis. She is having no other breast problems or symptoms. HPI  Past Medical History  Diagnosis Date  . Depression   . Hypertension   . Thyroid disease   . GERD (gastroesophageal reflux disease)   . Pancreatitis     hx of  . Knee pain, bilateral   . Obesity   . HSV (herpes simplex virus) infection   . Hyperlipemia   . Asthma   . Tachycardia     with sx in 2008  . Breast mass in female     L breast 2008, US showed likely fat necrosis  . Sleep apnea   . Arthritis   . Breast cancer   . Anemia   . Anxiety   . Headache   . Heartburn     Past Surgical History  Procedure Date  . Replacement total knee bilateral   . Foot surgery   . Abdominal hysterectomy   . Abdominal surgery   . Cholecystectomy   . Breast surgery   . Joint replacement   . Thyroid surgery     Family History  Problem Relation Age of Onset  . Heart disease Brother   . Diabetes Brother   . Hypertension Mother   . Diabetes Mother   . Cancer Mother   . Breast cancer Mother   . Bone cancer Mother   . Hypertension Sister   . Diabetes Sister   . Cancer Sister   . Breast cancer Maternal Grandmother   . Heart disease Maternal Grandmother     Social History History  Substance Use Topics  . Smoking status: Former Smoker -- 1.0 packs/day for 10 years   Types: Cigarettes    Quit date: 03/24/1992  . Smokeless tobacco: Never Used  . Alcohol Use: No    Allergies  Allergen Reactions  . Shellfish Allergy Shortness Of Breath  . Morphine And Related Other (See Comments)    Overly sedated  . Oxycodone Hcl     REACTION: hives    Current Outpatient Prescriptions  Medication Sig Dispense Refill  . albuterol (PROVENTIL HFA;VENTOLIN HFA) 108 (90 BASE) MCG/ACT inhaler Inhale 2 puffs into the lungs every 6 (six) hours as needed. For wheezing      . ARIPiprazole (ABILIFY) 2 MG tablet Take 2 mg by mouth daily.      . budesonide-formoterol (SYMBICORT) 80-4.5 MCG/ACT inhaler Inhale 2 puffs into the lungs 2 (two) times daily.      [name] buPROPion (WELLBUTRIN SR) 150 MG 12 hr tablet Take 450 mg by mouth daily.      . cyclobenzaprine (FLEXERIL) 10 MG tablet Take 10 mg by mouth 3 (three) times daily as needed. For muscle spasms      . esomeprazole (NEXIUM) 40 MG capsule Take  40 mg by mouth daily before breakfast.      . HYDROcodone-acetaminophen (LORTAB) 10-500 MG per tablet Take 1 tablet by mouth every 4 (four) hours as needed. For pain      . levothyroxine (SYNTHROID, LEVOTHROID) 200 MCG tablet Take 1 tablet (200 mcg total) by mouth daily before breakfast.  30 tablet  3  . LORazepam (ATIVAN) 1 MG tablet Take 1 mg by mouth every 8 (eight) hours as needed. For anxiety      . meloxicam (MOBIC) 15 MG tablet Take 15 mg by mouth daily.      [name] PARoxetine (PAXIL) 20 MG tablet Take 20 mg by mouth every morning.      . pravastatin (PRAVACHOL) 80 MG tablet Take 80 mg by mouth daily.      . QUEtiapine (SEROQUEL) 300 MG tablet Take 900 mg by mouth at bedtime.      . traZODone (DESYREL) 100 MG tablet Take 100 mg by mouth at bedtime.      . valACYclovir (VALTREX) 1000 MG tablet Take 1,000 mg by mouth daily.      [name] zolpidem (AMBIEN) 10 MG tablet Take 10 mg by mouth at bedtime as needed. For sleep        Review of Systems Review of Systems  Constitutional: Positive for  fatigue. Negative for fever, chills and unexpected weight change.  HENT: Positive for sinus pressure and tinnitus. Negative for hearing loss, congestion, sore throat, trouble swallowing and voice change.   Eyes: Negative for visual disturbance.  Respiratory: Positive for cough and shortness of breath. Negative for wheezing.   Cardiovascular: Positive for palpitations. Negative for chest pain and leg swelling.  Gastrointestinal: Negative for nausea, vomiting, abdominal pain, diarrhea, constipation, blood in stool, abdominal distention and anal bleeding.       Heartrburn  Genitourinary: Negative for hematuria, vaginal bleeding and difficulty urinating.  Musculoskeletal: Positive for back pain, joint swelling and arthralgias.  Skin: Negative for rash and wound.  Neurological: Negative for seizures, syncope and headaches.  Hematological: Negative for adenopathy. Does not bruise/bleed easily.  Psychiatric/Behavioral: Negative for confusion. The patient is nervous/anxious.        Depression    Blood pressure 173/74, pulse 102, temperature 98.1 F (36.7 C), resp. rate 20, height 5' 4.3" (1.633 m), weight 341 lb (154.677 kg).  Physical Exam Physical Exam  Vitals reviewed. Constitutional: She is oriented to person, place, and time. She appears well-developed and well-nourished. No distress.  HENT:  Head: Normocephalic and atraumatic.  Mouth/Throat: Oropharynx is clear and moist.  Eyes: Conjunctivae normal and EOM are normal. Pupils are equal, round, and reactive to light. No scleral icterus.  Neck: Normal range of motion. Neck supple. No tracheal deviation present. No thyromegaly present.  Cardiovascular: Normal rate, regular rhythm, normal heart sounds and intact distal pulses.  Exam reveals no gallop and no friction rub.   No murmur heard. Pulmonary/Chest: Effort normal and breath sounds normal. No respiratory distress. She has no wheezes. She has no rales. Bony tenderness: Breast large,  pendulous, large birthmark on right, mass on right UOQ. Right breast exhibits mass and tenderness. Right breast exhibits no inverted nipple, no nipple discharge and no skin change. Left breast exhibits no inverted nipple, no mass, no nipple discharge, no skin change and no tenderness. Breasts are symmetrical.    Abdominal: Soft. Bowel sounds are normal. She exhibits no distension and no mass. There is no tenderness. There is no rebound and no guarding.  Hard to evaluate as VERY large  Musculoskeletal: Normal range of motion. She exhibits no edema and no tenderness.  Neurological: She is alert and oriented to person, place, and time.  Skin: Skin is warm and dry. No rash noted. She is not diaphoretic. No erythema.  Psychiatric: She has a normal mood and affect. Her behavior is normal. Judgment and thought content normal.    Data Reviewed I have reviewed the mammogram and MRI films and reports his radiologist and the pathology slides and reports with the pathologist. I have reviewed the case and discuss it with the medical and radiation oncologist.   Assessment    Clinical stage 0 right breast cancer,upper outer quadrant, arising in a papilloma Morbid obesity Hypertension    Plan    I reviewed the situation and discussed. I think the best option is a right lumpectomy. When pathology report is available we can make decisions about postlumpectomy therapy. The patient has significant risk factors with her morbid obesity hypertension etc. She has the patient's immobility due to knee problems. I think the least surgery we can do the better in the situation.  I have explained the pathophysiology and staging of breast cancer with particular attention to her exact situation. We discussed the multidisciplinary approach to breast cancer which often includes both medical and radiation oncology consultations.  We also discussed surgical options for the treatment of breast cancer including lumpectomy  and mastectomy with possible reconstructive surgery. In addition we talked about the evaluation and management of lymph nodes including a description of sentinel lymph node biopsy and axillary dissections. We reviewed potential complications and risks including bleeding, infection, numbness,  lymphedema, and the potential need for additional surgery.  She understands that for patients who are candidate for lumpectomy or mastectomy there is an equal survival rate with either technique, but a slightly higher local recurrence rate with lumpectomy. In addition she knows that a lumpectomy usually requires postoperative radiation as part of the management of the breast cancer.  We have discussed the likely postoperative course and plans for followup.  I have given the patient some written information that reviewed all of these issues. I believe her questions are answered and that she has a good understanding of the issues.        Tiani Stanbery J 02/25/2012, 3:09 PM

## 2012-03-03 NOTE — Anesthesia Postprocedure Evaluation (Signed)
  Anesthesia Post-op Note  Patient: [name]  Procedure(s) Performed: Procedure(s) (LRB) with comments: LUMPECTOMY (Right)  Patient Location: PACU  Anesthesia Type:General  Level of Consciousness: awake  Airway and Oxygen Therapy: Patient Spontanous Breathing  Post-op Pain: mild  Post-op Assessment: Post-op Vital signs reviewed  Post-op Vital Signs: Reviewed  Complications: No apparent anesthesia complications

## 2012-03-03 NOTE — Anesthesia Preprocedure Evaluation (Addendum)
Anesthesia Evaluation  Patient identified by MRN, date of birth, ID band Patient awake    Reviewed: Allergy & Precautions, H&P , NPO status , Patient's Chart, lab work & pertinent test results  History of Anesthesia Complications Negative for: history of anesthetic complications  Airway Mallampati: II TM Distance: >3 FB Neck ROM: Full  Mouth opening: Limited Mouth Opening  Dental  (+) Teeth Intact   Pulmonary asthma , sleep apnea ,  breath sounds clear to auscultation        Cardiovascular hypertension, Rhythm:Regular Rate:Normal     Neuro/Psych    GI/Hepatic Neg liver ROS, GERD-  ,  Endo/Other  Hypothyroidism   Renal/GU Renal disease     Musculoskeletal   Abdominal   Peds  Hematology   Anesthesia Other Findings   Reproductive/Obstetrics                         Anesthesia Physical Anesthesia Plan  ASA: III  Anesthesia Plan: General   Post-op Pain Management:    Induction: Intravenous  Airway Management Planned: Oral ETT  Additional Equipment:   Intra-op Plan:   Post-operative Plan: Possible Post-op intubation/ventilation  Informed Consent: I have reviewed the patients History and Physical, chart, labs and discussed the procedure including the risks, benefits and alternatives for the proposed anesthesia with the patient or authorized representative who has indicated his/her understanding and acceptance.   Dental advisory given  Plan Discussed with: CRNA, Anesthesiologist and Surgeon  Anesthesia Plan Comments:        Anesthesia Quick Evaluation

## 2012-03-03 NOTE — Preoperative (Signed)
Beta Blockers   Reason not to administer Beta Blockers:Not Applicable 

## 2012-03-03 NOTE — Progress Notes (Signed)
Pt up in recliner o2 sats 88-92 on ra IS used pulled 1500 consulted with dr Oletta Lamas ok for pt to d/c home with sats 88 or higher on ra

## 2012-03-03 NOTE — Interval H&P Note (Signed)
History and Physical Interval Note:  03/03/2012 8:16 AM  [name]  has presented today for surgery, with the diagnosis of right breast cancer upper outer quadrant   The various methods of treatment have been discussed with the patient and family. After consideration of risks, benefits and other options for treatment, the patient has consented to  Procedure(s) (LRB) with comments: LUMPECTOMY (Right) as a surgical intervention .  The patient's history has been reviewed, patient examined, no change in status, stable for surgery.  I have reviewed the patient's chart and labs.  Questions were answered to the patient's satisfaction.    I have marked the right breast as the operative side. The patient questioned whether we would do something with her right axilla as she waas having pain there. I told her at this point, with the negaitive biopsy and the added risks for surgery for her, that we plan to leave the axillary area alone, no surgery there [name] J

## 2012-03-05 ENCOUNTER — Telehealth (INDEPENDENT_AMBULATORY_CARE_PROVIDER_SITE_OTHER): Payer: Self-pay | Admitting: General Surgery

## 2012-03-05 ENCOUNTER — Encounter (HOSPITAL_COMMUNITY): Payer: Self-pay | Admitting: Surgery

## 2012-03-05 NOTE — Telephone Encounter (Signed)
Patient made aware of path results. Will follow up at appt and call with any questions prior.

## 2012-03-05 NOTE — Telephone Encounter (Signed)
Message copied by Margarette Asal on Fri Mar 05, 2012  1:23 PM ------      Message from: Haywood Lasso      Created: Fri Mar 05, 2012 12:36 PM       Tell the patient that her margins are OK.. I will discuss in detail in the office.

## 2012-03-16 ENCOUNTER — Encounter: Payer: Self-pay | Admitting: Radiation Oncology

## 2012-03-16 NOTE — Progress Notes (Signed)
Follow up [facility] Right Breast Cancer  Upper Outer ER/PR=positive 03/03/12 Right Breast Lumpectomy=DUCTAL  CARCINOMA  In [facility], Dr. Neldon Mc DX 02/13/12, 10  0' clock right breast, right axillary lymph node bx (0/1) neg. For tumor  Single,  1daughter    Allergies: Shellfish=SOB Morphine and Related=Overly Sedated Oxycodone Hcl=Hives  No Hx Radiation  No HX Pacemaker

## 2012-03-17 ENCOUNTER — Ambulatory Visit: Payer: Medicare Other

## 2012-03-17 ENCOUNTER — Ambulatory Visit
Admission: RE | Admit: 2012-03-17 | Discharge: 2012-03-17 | Payer: Medicare Other | Source: Ambulatory Visit | Attending: Radiation Oncology | Admitting: Radiation Oncology

## 2012-03-17 ENCOUNTER — Encounter (INDEPENDENT_AMBULATORY_CARE_PROVIDER_SITE_OTHER): Payer: Medicare Other | Admitting: Surgery

## 2012-03-18 ENCOUNTER — Telehealth: Payer: Self-pay | Admitting: Oncology

## 2012-03-18 NOTE — Telephone Encounter (Signed)
pt called and needed 4pm appts.Tammie KitchenMarland KitchenMarland KitchenDone

## 2012-03-19 ENCOUNTER — Encounter (INDEPENDENT_AMBULATORY_CARE_PROVIDER_SITE_OTHER): Payer: Medicare Other | Admitting: Surgery

## 2012-03-22 ENCOUNTER — Telehealth (INDEPENDENT_AMBULATORY_CARE_PROVIDER_SITE_OTHER): Payer: Self-pay | Admitting: General Surgery

## 2012-03-22 MED ORDER — HYDROCODONE-ACETAMINOPHEN 5-325 MG PO TABS: 1.0000 | ORAL_TABLET | Freq: Four times a day (QID) | ORAL | Status: DC | PRN

## 2012-03-22 NOTE — Telephone Encounter (Signed)
Request for Norco 5/325 #30 with no refills received 03/22/12. Approved per automatic refill protocol and called to [facility] on [address].

## 2012-03-24 ENCOUNTER — Ambulatory Visit
Admission: RE | Admit: 2012-03-24 | Discharge: 2012-03-24 | Disposition: A | Discharge status: Home / Self Care | Payer: Medicare Other | Source: Ambulatory Visit | Attending: Radiation Oncology | Admitting: Radiation Oncology

## 2012-03-24 ENCOUNTER — Ambulatory Visit: Payer: Medicare Other

## 2012-03-24 ENCOUNTER — Telehealth: Payer: Self-pay | Admitting: Radiation Oncology

## 2012-03-24 ENCOUNTER — Institutional Professional Consult (permissible substitution): Payer: Medicare Other | Admitting: Radiation Oncology

## 2012-03-24 ENCOUNTER — Encounter: Payer: Self-pay | Admitting: Radiation Oncology

## 2012-03-24 VITALS — BP 154/87 | HR 100 | Temp 98.2°F | Resp 18 | Ht 67.0 in | Wt 340.0 lb

## 2012-03-24 DIAGNOSIS — C50419 Malignant neoplasm of upper-outer quadrant of unspecified female breast: Secondary | ICD-10-CM

## 2012-03-24 DIAGNOSIS — D0590 Unspecified type of carcinoma in situ of unspecified breast: Secondary | ICD-10-CM | POA: Insufficient documentation

## 2012-03-24 DIAGNOSIS — C50919 Malignant neoplasm of unspecified site of unspecified female breast: Secondary | ICD-10-CM

## 2012-03-24 NOTE — Progress Notes (Signed)
See progress note under physician encounter.

## 2012-03-24 NOTE — Telephone Encounter (Signed)
Patient late for scheduled appointment. Phoned to check status. No answer.

## 2012-03-24 NOTE — Progress Notes (Signed)
Radiation Oncology         [phone] ________________________________  Name: [name] MRN: [id number]  Date: 03/24/2012  DOB: January 02, 1956  Follow-Up Visit Note  CC: Elyn Peers, MD  Streck, Autumn Patty, MD  Diagnosis:   Intraductal carcinoma the right breast  Narrative:  The patient returns today for further evaluation. She was initially seen in the multidisciplinary breast clinic. Since her initial evaluation she has undergone a right partial mastectomy under the direction of Dr. Osborn Coho. Patient was found to have a low-grade ductal carcinoma in situ. The surgical margins were clear with the closest margin being inferior at 6 mm tumor was estimated to be approximately 2.8 cm. Previous evaluation the tumor was estrogen receptor positive at 100% and progesterone receptor positive at 100%. patient did have some soreness in the breast area seems to be doing recently well                           ALLERGIES:  is allergic to shellfish allergy; morphine and related; and oxycodone hcl.  Meds: Current Outpatient Prescriptions  Medication Sig Dispense Refill  . albuterol (PROVENTIL HFA;VENTOLIN HFA) 108 (90 BASE) MCG/ACT inhaler Inhale 2 puffs into the lungs every 6 (six) hours as needed.      [name] amLODipine (NORVASC) 10 MG tablet Take 10 mg by mouth daily.      . ARIPiprazole (ABILIFY) 10 MG tablet Take 10 mg by mouth daily.      . budesonide-formoterol (SYMBICORT) 80-4.5 MCG/ACT inhaler Inhale 1-2 puffs into the lungs daily as needed. For shortness of breath      . buPROPion (WELLBUTRIN XL) 150 MG 24 hr tablet Take 300 mg by mouth daily.      [name] esomeprazole (NEXIUM) 40 MG capsule Take 40 mg by mouth daily before breakfast.      . HYDROcodone-acetaminophen (LORTAB) 10-500 MG per tablet Take 1 tablet by mouth every 4 (four) hours as needed.      [name] HYDROcodone-acetaminophen (LORTAB) 7.5-500 MG per tablet Take 1 tablet by mouth every 8 (eight) hours as needed. For pain      .  HYDROcodone-acetaminophen (NORCO) 5-325 MG per tablet Take 1 tablet by mouth every 4 (four) hours as needed for pain.  30 tablet  0  . HYDROcodone-acetaminophen (NORCO) 5-325 MG per tablet Take 1 tablet by mouth every 6 (six) hours as needed for pain.  30 tablet  0  . levothyroxine (SYNTHROID, LEVOTHROID) 200 MCG tablet Take 200 mcg by mouth daily.      [name] LORazepam (ATIVAN) 1 MG tablet Take 1 mg by mouth every 8 (eight) hours as needed. For anxiety      . meloxicam (MOBIC) 15 MG tablet Take 15 mg by mouth daily.      [name] PARoxetine (PAXIL) 20 MG tablet Take 30 mg by mouth every morning.      . pravastatin (PRAVACHOL) 40 MG tablet Take 80 mg by mouth at bedtime.      [name] QUEtiapine (SEROQUEL) 300 MG tablet Take 900 mg by mouth at bedtime.      . traZODone (DESYREL) 100 MG tablet Take 100 mg by mouth at bedtime.      . valACYclovir (VALTREX) 1000 MG tablet Take 1,000 mg by mouth daily.      [name] zolpidem (AMBIEN) 10 MG tablet Take 10 mg by mouth at bedtime as needed. For sleep        Physical Findings: The  patient is in no acute distress. Patient is alert and oriented.  height is _0  (1.702 m) and weight is 340 lb (154.223 kg). Her oral temperature is 98.2 F (36.8 C). Her blood pressure is 154/87 and her pulse is 100. Her respiration is 18. .  She is accompanied by her son on evaluation today.  No palpable supraclavicular or axillary adenopathy. Examination right breast reveals a well healing scar in the upper outer quadrant. There is no signs of drainage or infection in the breast area. Patient said her wheelchair for the entire evaluation. She is unable to get up on the examination table in light of her overall performance status.  Lab Findings: Lab Results  Component Value Date   WBC 6.6 03/02/2012   HGB 11.7* 03/02/2012   HCT 36.1 03/02/2012   MCV 86.2 03/02/2012   PLT 185 03/02/2012    _1 @  Radiographic Findings: No results found.  Impression:  Low-grade intraductal carcinoma the  right breast. Patient's tumor was completely resected however it was rather large in size (2.8 cm). With the patient's age and above findings I in general would recommend radiation therapy as part of her overall management. Given her multiple medical problems and performance status it would be very difficult for her to come in for radiation therapy over approximately 6 weeks.  She would not be a candidate for hypofractionated accelerated treatment in light of her large pendulous breasts size.  Plan:  The patient is undecided whether she would like to attempt radiation therapy as part of her overall management. She would like to hear more about her other option that being adjuvant hormonal therapy. We will try to move up medical oncology's appointment so that she can make an informed decision as to which treatment approach she would like to proceed with.    _____________________________________  -----------------------------------  Blair Promise, PhD, MD

## 2012-03-25 ENCOUNTER — Other Ambulatory Visit: Payer: Self-pay | Admitting: Oncology

## 2012-03-26 ENCOUNTER — Telehealth (INDEPENDENT_AMBULATORY_CARE_PROVIDER_SITE_OTHER): Payer: Self-pay | Admitting: General Surgery

## 2012-03-26 NOTE — Addendum Note (Signed)
Encounter addended by: Deirdre Evener, RN on: 03/26/2012  5:58 PM<BR>     Documentation filed: Charges VN

## 2012-03-26 NOTE — Telephone Encounter (Signed)
Pt called to ask about her surgical wound.  She describes it as hard and somewhat painful.  Denies fever, swelling, red-streaking or exudate.  States it is not more painful that when first post op.  No transportation to Urgent clinic for check today, but has appt on Monday with Dr. Margot Chimes for her post op recheck.  She will continue to use pain meds and NSAIDs and ice pack until seen.

## 2012-03-27 ENCOUNTER — Encounter: Payer: Self-pay | Admitting: *Deleted

## 2012-03-27 NOTE — Progress Notes (Signed)
Mailed after appt letter to pt. 

## 2012-03-30 ENCOUNTER — Ambulatory Visit (INDEPENDENT_AMBULATORY_CARE_PROVIDER_SITE_OTHER): Payer: Medicare Other | Admitting: Surgery

## 2012-03-30 ENCOUNTER — Encounter (INDEPENDENT_AMBULATORY_CARE_PROVIDER_SITE_OTHER): Payer: Self-pay | Admitting: Surgery

## 2012-03-30 VITALS — BP 160/96 | HR 111 | Temp 95.6°F | Resp 20 | Ht 66.0 in | Wt 344.6 lb

## 2012-03-30 DIAGNOSIS — Z09 Encounter for follow-up examination after completed treatment for conditions other than malignant neoplasm: Secondary | ICD-10-CM

## 2012-03-30 NOTE — Progress Notes (Signed)
[name]    [id number] 03/30/2012    1955-05-04   CC:  Chief Complaint  Patient presents with  . Routine Post Op    1st po R lumpectomy     HPI: The patient returns for post op follow-up. She underwent a right lumpectomy on 03/03/12. Over all she feels that she is doing well.She has had some pain at the incision, but improvin  PE: VITAL SIGNS: BP 160/96  Pulse 111  Temp(Src) 95.6 F (35.3 C) (Temporal)  Resp 20  Ht _0  (1.676 m)  Wt 344 lb 9.6 oz (156.31 kg)  BMI 55.65 kg/m2  The incision is healing nicely and there is no evidence of infection or hematoma.   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 Needs to see rad onc again  PLAN: Her next visit will be in twp months.

## 2012-03-30 NOTE — Patient Instructions (Signed)
See me again in about two months

## 2012-03-31 ENCOUNTER — Other Ambulatory Visit: Payer: Self-pay | Admitting: Physician Assistant

## 2012-04-01 ENCOUNTER — Telehealth: Payer: Self-pay | Admitting: Oncology

## 2012-04-01 NOTE — Telephone Encounter (Signed)
S/w the pt and she is aware to pick up her revised appt calendars for feb and march for the lab and the md visit

## 2012-04-08 ENCOUNTER — Ambulatory Visit (HOSPITAL_BASED_OUTPATIENT_CLINIC_OR_DEPARTMENT_OTHER): Payer: Medicare Other | Admitting: Genetic Counselor

## 2012-04-08 ENCOUNTER — Other Ambulatory Visit: Payer: Medicare Other | Admitting: Lab

## 2012-04-08 DIAGNOSIS — Z803 Family history of malignant neoplasm of breast: Secondary | ICD-10-CM

## 2012-04-08 DIAGNOSIS — C50419 Malignant neoplasm of upper-outer quadrant of unspecified female breast: Secondary | ICD-10-CM

## 2012-04-09 ENCOUNTER — Encounter: Payer: Self-pay | Admitting: Genetic Counselor

## 2012-04-09 NOTE — Progress Notes (Signed)
Dr.  Sarajane Jews Magrinat requested a consultation for genetic counseling and risk assessment for CHARLESETTA MILLIRON, a 57 y.o. female, for discussion of her personal and family history of breast cancer. She presents to clinic today to discuss the possibility of a genetic predisposition to cancer, and to further clarify her risks, as well as her family members' risks for cancer.   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 This was treated with lumpectomy.     Past Medical History  Diagnosis Date  . Depression   . Thyroid disease   . GERD (gastroesophageal reflux disease)   . Pancreatitis     hx of  . Knee pain, bilateral   . Obesity   . HSV (herpes simplex virus) infection   . Hyperlipemia   . Asthma   . Tachycardia     with sx in 2008  . Breast mass in female     L breast 2008, US showed likely fat necrosis  . Sleep apnea   . Arthritis   . Anemia   . Anxiety   . Headache   . Heartburn   . Hypertension     sees Dr. Criss Rosales [facility]  . Kidney stones     hx of  . 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    Past Surgical History  Procedure Laterality Date  . Replacement total knee bilateral    . Foot surgery    . Abdominal hysterectomy    . Abdominal surgery    . Cholecystectomy    . Breast surgery    . Joint replacement    . Thyroid surgery    . Breast lumpectomy  03/03/2012    Procedure: LUMPECTOMY;  Surgeon: Haywood Lasso, MD;  Location: [facility];  Service: General;  Laterality: Right;    History  Substance Use Topics  . Smoking status: Former Smoker -- 1.00 packs/day for 7 years    Types: Cigarettes    Quit date: 03/24/1992  . Smokeless tobacco: Never Used  . Alcohol Use: No    REPRODUCTIVE HISTORY AND PERSONAL RISK ASSESSMENT FACTORS: Menarche was at age 76.   Menopausal Uterus Intact: No Ovaries Intact: Yes G2P2A0 , first live  birth at age 28  She has not previously undergone treatment for infertility.   Never used OCPs   She has not used HRT in the past.    FAMILY HISTORY:  We obtained a detailed, 4-generation family history.  Significant diagnoses are listed below: Family History  Problem Relation Age of Onset  . Heart disease Brother   . Diabetes Brother   . Hypertension Mother   . Diabetes Mother   . Breast cancer Mother 66  . Bone cancer Mother   . Hypertension Sister   . Diabetes Sister   . Breast cancer Sister 69  . Breast cancer Maternal Grandmother   . Heart disease Maternal Grandmother   . Uterine cancer Other 19  . Breast cancer Paternal Aunt 30  . Breast cancer Paternal Grandmother     dx in her 65s  . Prostate cancer Paternal Grandfather   The patient was diagnosed with breast cancer at age 75.  Her 13 YO sister has also recently been diagnosed with breast cancer.  The patien'ts mother was diagnosed with breast cancer at age 23 and died at 61.  Her patenral aunt  was diagnosed with breast cancer at age 78 and her paternal grandmother was also diagnosed with breast cancer in her 66s.  The patient's paternal grandfather had prostate cancer and his maternal half sister had a brain tumor.    Patient's maternal ancestors are of [place] and [place] descent, and paternal ancestors are of [place] American descent. There is no reported Ashkenazi Jewish ancestry. There is no  known consanguinity.  GENETIC COUNSELING RISK ASSESSMENT, DISCUSSION, AND SUGGESTED FOLLOW UP: We reviewed the natural history and genetic etiology of sporadic, familial and hereditary cancer syndromes.  About 5-10% of breast cancer is hereditary.  Of this, about 85% is the result of a BRCA1 or BRCA2 mutation.  We reviewed the red flags of hereditary cancer syndromes and the dominant inheritance patterns.  If the BRCA testing is negative, we discussed that we could be testing for the wrong gene.  We discussed gene panels, and  that several cancer genes that are associated with different cancers can be tested at the same time.  Because of the different types of cancer that are in the patient's family, we will consider one of the panel tests if she is negative for BRCA mutations.   The patient's personal and family history of breast cancer is suggestive of the following possible diagnosis: hereditary cancer syndrome  We discussed that identification of a hereditary cancer syndrome may help her care providers tailor the patients medical management. If a mutation indicating a hereditary cancer syndrome is detected in this case, the [employer]  recommendations would include increased cancer surveillance and possible prophylactic surgery. If a mutation is detected, the patient will be referred back to the referring provider and to any additional appropriate care providers to discuss the relevant options.   If a mutation is not found in the patient, this will decrease the likelihood of a hereditary cancer syndrome as the explanation for her breast cancer. Cancer surveillance options would be discussed for the patient according to the appropriate standard [place] guidelines, with consideration of their personal and family history risk factors. In this case, the patient will be referred back to their care providers for discussions of management.   After considering the risks, benefits, and limitations, the patient provided informed consent for  the following  testing: [employer]ACAnalysis and Myrisk through [employer].   Per the patient's request, we will contact her by telephone to discuss these results. A follow up genetic counseling visit will be scheduled if indicated.  The patient was seen for a total of 60 minutes, greater than 50% of which was spent face-to-face counseling.  This plan is being carried out per Dr. Sarajane Jews Magrinat's recommendations.   This note will also be sent to the referring provider via the electronic medical record. The patient will be supplied with a summary of this genetic counseling discussion as well as educational information on the discussed hereditary cancer syndromes following the conclusion of their visit.   Patient was discussed with Dr. Marcy Panning.   _______________________________________________________________________ For Office Staff:  Number of people involved in session: 2 Was an Intern/ student involved with case: no

## 2012-04-12 ENCOUNTER — Telehealth (INDEPENDENT_AMBULATORY_CARE_PROVIDER_SITE_OTHER): Payer: Self-pay | Admitting: General Surgery

## 2012-04-12 NOTE — Telephone Encounter (Signed)
Received a refill request for Norco 5/325 on patient. I called patient to discuss since her surgery was on 03/03/2012. She states she is still having pain in her breast. She has not started radiation treatments. I advised patient try ibuprofen since over two months from her surgery. She will call back if this does not help the pain.

## 2012-04-13 ENCOUNTER — Other Ambulatory Visit (HOSPITAL_BASED_OUTPATIENT_CLINIC_OR_DEPARTMENT_OTHER): Payer: Medicare Other

## 2012-04-13 DIAGNOSIS — C50419 Malignant neoplasm of upper-outer quadrant of unspecified female breast: Secondary | ICD-10-CM

## 2012-04-13 LAB — COMPREHENSIVE METABOLIC PANEL (CC13)
AST: 9 U/L (ref 5–34)
Albumin: 3.3 g/dL — ABNORMAL LOW (ref 3.5–5.0)
Alkaline Phosphatase: 85 U/L (ref 40–150)
Glucose: 133 mg/dl — ABNORMAL HIGH (ref 70–99)
Potassium: 4 mEq/L (ref 3.5–5.1)
Sodium: 141 mEq/L (ref 136–145)
Total Protein: 6.8 g/dL (ref 6.4–8.3)

## 2012-04-13 LAB — CBC WITH DIFFERENTIAL/PLATELET
Eosinophils Absolute: 0.3 10*3/uL (ref 0.0–0.5)
MCV: 84.5 fL (ref 79.5–101.0)
MONO%: 4.7 % (ref 0.0–14.0)
NEUT#: 4.5 10*3/uL (ref 1.5–6.5)
RBC: 4.28 10*6/uL (ref 3.70–5.45)
RDW: 15.3 % — ABNORMAL HIGH (ref 11.2–14.5)
WBC: 8 10*3/uL (ref 3.9–10.3)
lymph#: 2.8 10*3/uL (ref 0.9–3.3)

## 2012-04-19 ENCOUNTER — Telehealth: Payer: Self-pay | Admitting: *Deleted

## 2012-04-19 NOTE — Telephone Encounter (Signed)
Patient called and left message to reschedule MD appt. Message forwarded to the desk RN.  JMW

## 2012-04-20 ENCOUNTER — Other Ambulatory Visit: Payer: Self-pay | Admitting: *Deleted

## 2012-04-20 ENCOUNTER — Ambulatory Visit: Payer: Medicare Other | Admitting: Physician Assistant

## 2012-04-20 ENCOUNTER — Telehealth: Payer: Self-pay | Admitting: Radiation Oncology

## 2012-04-20 NOTE — Telephone Encounter (Signed)
Phoned patient as requested by Dr. Sondra Come to inquire if patient had decided between hormone therapy and radiation therapy. Patient states,"I want to do what is best....thats the radiation." Informed patient that someone would contact her with her next appointment. Patient reports that because of her son's schedule (he is her only means of transportation) she would only be able to come in for appointments between 10 and 1. Routed this message to Dr. Sondra Come.

## 2012-04-20 NOTE — Progress Notes (Signed)
Pt called to this RN stating need to cancel appt today due to time. She needs to reschedule to am appt due to transportation arrangements. This RN reviewed pt's chart and noted pt is proceeding with radiation therapy. Discussed with pt appointment will be rescheduled to end of radiation for consideration of antiestrogen.

## 2012-04-21 ENCOUNTER — Telehealth (INDEPENDENT_AMBULATORY_CARE_PROVIDER_SITE_OTHER): Payer: Self-pay | Admitting: General Surgery
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
## 2012-04-21 NOTE — Telephone Encounter (Signed)
Request for Norco received from [facility]. Denied and faxed back per Dr Margot Chimes. Patient too far out from surgery for refill. If still having pain needs to make an appt for evaluation.

## 2012-04-22 ENCOUNTER — Emergency Department (HOSPITAL_COMMUNITY)
Admission: EM | Admit: 2012-04-22 | Discharge: 2012-04-22 | Disposition: A | Discharge status: Home / Self Care | Payer: Medicare Other | Attending: Emergency Medicine | Admitting: Emergency Medicine

## 2012-04-22 ENCOUNTER — Encounter (HOSPITAL_COMMUNITY): Payer: Self-pay | Admitting: Emergency Medicine

## 2012-04-22 ENCOUNTER — Emergency Department (HOSPITAL_COMMUNITY): Payer: Medicare Other

## 2012-04-22 DIAGNOSIS — Z8742 Personal history of other diseases of the female genital tract: Secondary | ICD-10-CM | POA: Insufficient documentation

## 2012-04-22 DIAGNOSIS — S161XXA Strain of muscle, fascia and tendon at neck level, initial encounter: Secondary | ICD-10-CM

## 2012-04-22 DIAGNOSIS — Z8739 Personal history of other diseases of the musculoskeletal system and connective tissue: Secondary | ICD-10-CM | POA: Insufficient documentation

## 2012-04-22 DIAGNOSIS — S0990XA Unspecified injury of head, initial encounter: Secondary | ICD-10-CM | POA: Insufficient documentation

## 2012-04-22 DIAGNOSIS — Z8719 Personal history of other diseases of the digestive system: Secondary | ICD-10-CM | POA: Insufficient documentation

## 2012-04-22 DIAGNOSIS — Z791 Long term (current) use of non-steroidal anti-inflammatories (NSAID): Secondary | ICD-10-CM | POA: Insufficient documentation

## 2012-04-22 DIAGNOSIS — I10 Essential (primary) hypertension: Secondary | ICD-10-CM | POA: Insufficient documentation

## 2012-04-22 DIAGNOSIS — Z87891 Personal history of nicotine dependence: Secondary | ICD-10-CM | POA: Insufficient documentation

## 2012-04-22 DIAGNOSIS — E079 Disorder of thyroid, unspecified: Secondary | ICD-10-CM | POA: Insufficient documentation

## 2012-04-22 DIAGNOSIS — R110 Nausea: Secondary | ICD-10-CM | POA: Insufficient documentation

## 2012-04-22 DIAGNOSIS — F411 Generalized anxiety disorder: Secondary | ICD-10-CM | POA: Insufficient documentation

## 2012-04-22 DIAGNOSIS — Z79899 Other long term (current) drug therapy: Secondary | ICD-10-CM | POA: Insufficient documentation

## 2012-04-22 DIAGNOSIS — E785 Hyperlipidemia, unspecified: Secondary | ICD-10-CM | POA: Insufficient documentation

## 2012-04-22 DIAGNOSIS — E669 Obesity, unspecified: Secondary | ICD-10-CM | POA: Insufficient documentation

## 2012-04-22 DIAGNOSIS — Z853 Personal history of malignant neoplasm of breast: Secondary | ICD-10-CM | POA: Insufficient documentation

## 2012-04-22 DIAGNOSIS — K219 Gastro-esophageal reflux disease without esophagitis: Secondary | ICD-10-CM | POA: Insufficient documentation

## 2012-04-22 DIAGNOSIS — F3289 Other specified depressive episodes: Secondary | ICD-10-CM | POA: Insufficient documentation

## 2012-04-22 DIAGNOSIS — W208XXA Other cause of strike by thrown, projected or falling object, initial encounter: Secondary | ICD-10-CM | POA: Insufficient documentation

## 2012-04-22 DIAGNOSIS — J45909 Unspecified asthma, uncomplicated: Secondary | ICD-10-CM | POA: Insufficient documentation

## 2012-04-22 DIAGNOSIS — F329 Major depressive disorder, single episode, unspecified: Secondary | ICD-10-CM | POA: Insufficient documentation

## 2012-04-22 DIAGNOSIS — Y9389 Activity, other specified: Secondary | ICD-10-CM | POA: Insufficient documentation

## 2012-04-22 DIAGNOSIS — Y9229 Other specified public building as the place of occurrence of the external cause: Secondary | ICD-10-CM | POA: Insufficient documentation

## 2012-04-22 DIAGNOSIS — S139XXA Sprain of joints and ligaments of unspecified parts of neck, initial encounter: Secondary | ICD-10-CM | POA: Insufficient documentation

## 2012-04-22 MED ORDER — FENTANYL CITRATE 0.05 MG/ML IJ SOLN
100.0000 ug | Freq: Once | INTRAMUSCULAR | Status: AC
  Administered 2012-04-22: 100 ug via NASAL
  Filled 2012-04-22: qty 2

## 2012-04-22 MED ORDER — HYDROCODONE-ACETAMINOPHEN 5-325 MG PO TABS: 2.0000 | ORAL_TABLET | ORAL | Status: DC | PRN

## 2012-04-22 MED ORDER — ONDANSETRON 4 MG PO TBDP
8.0000 mg | ORAL_TABLET | Freq: Once | ORAL | Status: AC
  Administered 2012-04-22: 8 mg via ORAL
  Filled 2012-04-22: qty 2

## 2012-04-22 NOTE — ED Provider Notes (Signed)
History     CSN: [id number]  Arrival date & time 04/22/12  1134   First MD Initiated Contact with Patient 04/22/12 1212      Chief Complaint  Patient presents with  . Head Injury  . 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 Her pain is severe and that her injury occurred just prior to arrival. She has been able to walk after this episode. Past Medical History  Diagnosis Date  . Depression   . Thyroid disease   . GERD (gastroesophageal reflux disease)   . Pancreatitis     hx of  . Knee pain, bilateral   . Obesity   . HSV (herpes simplex virus) infection   . Hyperlipemia   . Asthma   . Tachycardia     with sx in 2008  . Breast mass in female     L breast 2008, US showed likely fat necrosis  . Sleep apnea   . Arthritis   . Anemia   . Anxiety   . Headache   . Heartburn   . Hypertension     sees Dr. Criss Rosales , [facility]  . Kidney stones     hx of  . 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    Past Surgical History  Procedure Laterality Date  . Replacement total knee bilateral    . Foot surgery    . Abdominal hysterectomy    . Abdominal surgery    . Cholecystectomy    . Breast surgery    . Joint replacement    . Thyroid surgery    . Breast lumpectomy  03/03/2012    Procedure:  LUMPECTOMY;  Surgeon: Haywood Lasso, MD;  Location: [facility] OR;  Service: General;  Laterality: Right;    Family History  Problem Relation Age of Onset  . Heart disease Brother   . Diabetes Brother   . Hypertension Mother   . Diabetes Mother   . Breast cancer Mother 9  . Bone cancer Mother   . Hypertension Sister   . Diabetes Sister   . Breast cancer Sister 28  . Breast cancer Maternal Grandmother   . Heart disease Maternal Grandmother   . Uterine cancer Other 19  . Breast cancer Paternal Aunt 90  . Breast cancer Paternal Grandmother     dx in her 67s  . Prostate cancer Paternal Grandfather     History  Substance Use Topics  . Smoking status: Former Smoker -- 1.00 packs/day for 7 years    Types: Cigarettes    Quit date: 03/24/1992  . Smokeless tobacco: Never Used  . Alcohol Use: No    OB History   Grav Para Term Preterm Abortions TAB SAB Ect Mult Living                  Review of Systems 10  Systems reviewed and are negative for acute change except as noted in the HPI. Allergies  Shellfish allergy; Morphine and related; and Oxycodone hcl  Home Medications   Current Outpatient Rx  Name  Route  Sig  Dispense  Refill  . albuterol (PROVENTIL HFA;VENTOLIN HFA) 108 (90 BASE) MCG/ACT inhaler   Inhalation   Inhale 2 puffs into the lungs every 6 (six) hours as needed for wheezing or shortness of breath.          [name] amLODipine (NORVASC) 10 MG tablet   Oral   Take 10 mg by mouth daily.         . ARIPiprazole (ABILIFY) 10 MG tablet   Oral   Take 10 mg by mouth daily.         . budesonide-formoterol (SYMBICORT) 80-4.5 MCG/ACT inhaler   Inhalation   Inhale 1-2 puffs into the lungs daily as needed. For shortness of breath         . buPROPion (WELLBUTRIN XL) 150 MG 24 hr tablet   Oral   Take 300 mg by mouth daily.         [name] esomeprazole (NEXIUM) 40 MG capsule   Oral   Take 40 mg by mouth daily before breakfast.         . levothyroxine (SYNTHROID,  LEVOTHROID) 200 MCG tablet   Oral   Take 200 mcg by mouth daily.         [name] LORazepam (ATIVAN) 1 MG tablet   Oral   Take 1 mg by mouth every 8 (eight) hours as needed. For anxiety         . meloxicam (MOBIC) 15 MG tablet   Oral   Take 15 mg by mouth daily.         [name] PARoxetine (PAXIL) 20 MG tablet   Oral   Take 30 mg by mouth every morning.         . pravastatin (PRAVACHOL) 40 MG tablet   Oral   Take 80 mg by mouth at bedtime.         [name] QUEtiapine (SEROQUEL) 300 MG tablet   Oral   Take 600 mg by mouth at bedtime.          . traZODone (DESYREL) 100 MG tablet   Oral   Take 100 mg by mouth at bedtime.         . valACYclovir (VALTREX) 1000 MG tablet   Oral   Take 1,000 mg by mouth daily.         [name] zolpidem (AMBIEN) 10 MG tablet   Oral   Take 10 mg by mouth at bedtime as needed. For sleep         . HYDROcodone-acetaminophen (NORCO) 5-325 MG per tablet   Oral   Take 1 tablet by mouth every 6 (six) hours as needed for pain.   30 tablet   0   . HYDROcodone-acetaminophen (NORCO) 5-325 MG per tablet   Oral   Take 2 tablets by mouth every 4 (four) hours as needed for pain.   20 tablet   0     BP 118/64  Pulse 108  Temp(Src) 98.5 F (36.9 C) (Oral)  Resp 15  SpO2 94%  Physical Exam  Nursing note and vitals reviewed. Constitutional:  Awake, alert, nontoxic appearance with baseline speech for patient.  HENT:  Head: Atraumatic.  Mouth/Throat: No oropharyngeal exudate.  Eyes: EOM are normal. Pupils are equal, round, and reactive to light. Right eye exhibits  no discharge. Left eye exhibits no discharge.  Neck: Neck supple.  Diffuse mild midline cervical tenderness and paracervical tenderness  Cardiovascular: Normal rate and regular rhythm.   No murmur heard. Pulmonary/Chest: Effort normal and breath sounds normal. No stridor. No respiratory distress. She has no wheezes. She has no rales. She exhibits no tenderness.  Abdominal: Soft. Bowel sounds  are normal. She exhibits no mass. There is no tenderness. There is no rebound.  Musculoskeletal: She exhibits no tenderness.  Baseline ROM, moves extremities with no obvious new focal weakness.  Lymphadenopathy:    She has no cervical adenopathy.  Neurological: She is alert.  Awake, alert, cooperative and aware of situation; motor strength bilaterally; sensation normal to light touch bilaterally; peripheral visual fields full to confrontation; no facial asymmetry; tongue midline; major cranial nerves appear intact; no pronator drift, normal finger to nose bilaterally, patient states she had baseline gait without new ataxia prior to arrival  Skin: No rash noted.  Psychiatric: She has a normal mood and affect.    ED Course  Procedures (including critical care time)  Labs Reviewed - No data to display Ct Head Wo Contrast  04/22/2012  *RADIOLOGY REPORT*  Clinical Data:  History of trauma to the head complaining of head and neck pain.  CT HEAD WITHOUT CONTRAST CT CERVICAL SPINE WITHOUT CONTRAST  Technique:  Multidetector CT imaging of the head and cervical spine was performed following the standard protocol without intravenous contrast.  Multiplanar CT image reconstructions of the cervical spine were also generated.  Comparison:  Head CT 06/03/2011.  CT HEAD  Findings: No acute displaced skull fractures are identified.  No acute intracranial abnormality.  Specifically, no evidence of acute post-traumatic intracranial hemorrhage, no definite regions of acute/subacute cerebral ischemia, no focal mass, mass effect, hydrocephalus or abnormal intra or extra-axial fluid collections. The visualized paranasal sinuses and mastoids are well pneumatized.  IMPRESSION: 1.  No acute displaced skull fractures or acute intracranial abnormalities. 2.  The appearance of the brain is normal.  CT CERVICAL SPINE  Findings: There are no acute displaced cervical spine fractures. Mild straightening of normal cervical lordosis is  presumably positional.  Alignment is otherwise anatomic.  Prevertebral soft tissues are normal.  Multilevel degenerative disc disease is noted, most severe C4-C5, C5-C6 and C6-C7.  Mild multilevel facet arthropathy is also noted.  Visualized portions of the upper thorax are remarkable for some surgical clips at the level of the thoracic inlet, likely related to prior thyroid surgery.  IMPRESSION: 1.  No evidence of significant acute traumatic injury to the cervical spine. 2.  Moderate multilevel degenerative disc disease and cervical spondylosis, as above.   Original Report Authenticated By: Vinnie Langton, M.D.    Ct Cervical Spine Wo Contrast  04/22/2012  *RADIOLOGY REPORT*  Clinical Data:  History of trauma to the head complaining of head and neck pain.  CT HEAD WITHOUT CONTRAST CT CERVICAL SPINE WITHOUT CONTRAST  Technique:  Multidetector CT imaging of the head and cervical spine was performed following the standard protocol without intravenous contrast.  Multiplanar CT image reconstructions of the cervical spine were also generated.  Comparison:  Head CT 06/03/2011.  CT HEAD  Findings: No acute displaced skull fractures are identified.  No acute intracranial abnormality.  Specifically, no evidence of acute post-traumatic intracranial hemorrhage, no definite regions of acute/subacute cerebral ischemia, no focal mass, mass effect, hydrocephalus or abnormal intra or extra-axial fluid collections. The visualized paranasal sinuses and mastoids are well pneumatized.  IMPRESSION: 1.  No acute displaced skull fractures or acute intracranial abnormalities. 2.  The appearance of the brain is normal.  CT CERVICAL SPINE  Findings: There are no acute displaced cervical spine fractures. Mild straightening of normal cervical lordosis is presumably positional.  Alignment is otherwise anatomic.  Prevertebral soft tissues are normal.  Multilevel degenerative disc disease is noted, most severe C4-C5, C5-C6 and C6-C7.  Mild  multilevel facet arthropathy is also noted.  Visualized portions of the upper thorax are remarkable for some surgical clips at the level of the thoracic inlet, likely related to prior thyroid surgery.  IMPRESSION: 1.  No evidence of significant acute traumatic injury to the cervical spine. 2.  Moderate multilevel degenerative disc disease and cervical spondylosis, as above.   Original Report Authenticated By: Vinnie Langton, M.D.      1. Minor head injury, initial encounter   2. Cervical strain, acute, initial encounter       MDM   Pt stable in ED with no significant deterioration in condition.  Patient / Family / Caregiver informed of clinical course, understand medical decision-making process, and agree with plan.  I doubt any other EMC precluding discharge at this time including, but not necessarily limited to the following:[facility].       Babette Relic, MD 04/22/12 2122

## 2012-04-22 NOTE — ED Notes (Signed)
Patient transported to CT 

## 2012-04-22 NOTE — ED Notes (Addendum)
Pt states she was sitting next to a bookcase and several large boxes fell onto her head. Denies LOC. C/o headache and neck pain. A&Ox4. Ambulatory per pt baseline with cane. Pt placed in c-collar on arrival.

## 2012-04-22 NOTE — ED Notes (Signed)
Pt is alert and oriented x 4. Ambulatory, her son is coming to pick her up.

## 2012-04-26 ENCOUNTER — Ambulatory Visit
Admission: RE | Admit: 2012-04-26 | Discharge: 2012-04-26 | Disposition: A | Discharge status: Home / Self Care | Payer: Medicare Other | Source: Ambulatory Visit | Attending: Radiation Oncology | Admitting: Radiation Oncology

## 2012-04-26 DIAGNOSIS — Z510 Encounter for antineoplastic radiation therapy: Secondary | ICD-10-CM | POA: Insufficient documentation

## 2012-04-26 DIAGNOSIS — R5381 Other malaise: Secondary | ICD-10-CM | POA: Insufficient documentation

## 2012-04-26 DIAGNOSIS — N644 Mastodynia: Secondary | ICD-10-CM | POA: Insufficient documentation

## 2012-04-26 DIAGNOSIS — R5383 Other fatigue: Secondary | ICD-10-CM | POA: Insufficient documentation

## 2012-04-26 DIAGNOSIS — C50411 Malignant neoplasm of upper-outer quadrant of right female breast: Secondary | ICD-10-CM

## 2012-04-26 DIAGNOSIS — Z79899 Other long term (current) drug therapy: Secondary | ICD-10-CM | POA: Insufficient documentation

## 2012-04-26 DIAGNOSIS — C50419 Malignant neoplasm of upper-outer quadrant of unspecified female breast: Secondary | ICD-10-CM | POA: Insufficient documentation

## 2012-04-28 NOTE — Progress Notes (Signed)
  Radiation Oncology         [phone] ________________________________  Name: [name] MRN: [id number]  Date: 04/26/2012  DOB: 1955/12/08  SIMULATION AND TREATMENT PLANNING NOTE  DIAGNOSIS: Intraductal carcinoma the right breast   NARRATIVE:  The patient was brought to the [facility].  Identity was confirmed.  All relevant records and images related to the planned course of therapy were reviewed.  The patient freely provided informed written consent to proceed with treatment after reviewing the details related to the planned course of therapy. The consent form was witnessed and verified by the simulation staff.  Then, the patient was set-up in a stable reproducible  supine position for radiation therapy.  CT images were obtained.  Surface markings were placed.  The CT images were loaded into the planning software.  Then the target and avoidance structures were contoured.  Treatment planning then occurred.  The radiation prescription was entered and confirmed.  Then, I designed and supervised the construction of a total of 3 medically necessary complex treatment devices.  I have requested : Isodose Plan.  I have ordered:rad calc.  PLAN:  The patient will receive 45 Gy in 25 fractions followed by a boost directed at the site of presentation in the upper outer quadrant to 61.2 Gy.  ________________________________  -----------------------------------  Blair Promise, PhD, MD

## 2012-04-29 ENCOUNTER — Other Ambulatory Visit: Payer: Medicare Other | Admitting: Lab

## 2012-05-03 ENCOUNTER — Other Ambulatory Visit: Payer: Medicare Other | Admitting: Lab

## 2012-05-03 ENCOUNTER — Ambulatory Visit
Admission: RE | Admit: 2012-05-03 | Discharge: 2012-05-03 | Disposition: A | Discharge status: Home / Self Care | Payer: Medicare Other | Source: Ambulatory Visit | Attending: Radiation Oncology | Admitting: Radiation Oncology

## 2012-05-03 DIAGNOSIS — C50411 Malignant neoplasm of upper-outer quadrant of right female breast: Secondary | ICD-10-CM

## 2012-05-03 NOTE — Progress Notes (Signed)
  Radiation Oncology         [phone] ________________________________  Name: [name] MRN: [id number]  Date: 05/03/2012  DOB: [birth year]  Simulation Verification Note  Status: outpatient  NARRATIVE: The patient was brought to the treatment unit and placed in the planned treatment position. The clinical setup was verified. Then port films were obtained and uploaded to the radiation oncology medical record software.  The treatment beams were carefully compared against the planned radiation fields. The position location and shape of the radiation fields was reviewed. They targeted volume of tissue appears to be appropriately covered by the radiation beams. Organs at risk appear to be excluded as planned.  Based on my personal review, I approved the simulation verification. The patient's treatment will proceed as planned.  -----------------------------------  Blair Promise, PhD, MD

## 2012-05-04 ENCOUNTER — Telehealth: Payer: Self-pay | Admitting: Genetic Counselor

## 2012-05-04 ENCOUNTER — Ambulatory Visit: Payer: Medicare Other

## 2012-05-04 NOTE — Telephone Encounter (Signed)
Revealed negative test results

## 2012-05-05 ENCOUNTER — Ambulatory Visit
Admission: RE | Admit: 2012-05-05 | Discharge: 2012-05-05 | Disposition: A | Discharge status: Home / Self Care | Payer: Medicare Other | Source: Ambulatory Visit | Attending: Radiation Oncology | Admitting: Radiation Oncology

## 2012-05-06 ENCOUNTER — Ambulatory Visit: Payer: Medicare Other | Admitting: Oncology

## 2012-05-06 ENCOUNTER — Ambulatory Visit: Payer: Medicare Other

## 2012-05-07 ENCOUNTER — Ambulatory Visit
Admission: RE | Admit: 2012-05-07 | Discharge: 2012-05-07 | Disposition: A | Discharge status: Home / Self Care | Payer: Medicare Other | Source: Ambulatory Visit | Attending: Radiation Oncology | Admitting: Radiation Oncology

## 2012-05-07 ENCOUNTER — Encounter: Payer: Self-pay | Admitting: Genetic Counselor

## 2012-05-10 ENCOUNTER — Ambulatory Visit
Admission: RE | Admit: 2012-05-10 | Discharge: 2012-05-10 | Disposition: A | Discharge status: Home / Self Care | Payer: Medicare Other | Source: Ambulatory Visit | Attending: Radiation Oncology | Admitting: Radiation Oncology

## 2012-05-10 ENCOUNTER — Ambulatory Visit: Payer: Medicare Other | Admitting: Oncology

## 2012-05-11 ENCOUNTER — Encounter: Payer: Self-pay | Admitting: Radiation Oncology

## 2012-05-11 ENCOUNTER — Ambulatory Visit
Admission: RE | Admit: 2012-05-11 | Discharge: 2012-05-11 | Disposition: A | Discharge status: Home / Self Care | Payer: Medicare Other | Source: Ambulatory Visit | Attending: Radiation Oncology | Admitting: Radiation Oncology

## 2012-05-12 ENCOUNTER — Ambulatory Visit
Admission: RE | Admit: 2012-05-12 | Discharge: 2012-05-12 | Disposition: A | Discharge status: Home / Self Care | Payer: Medicare Other | Source: Ambulatory Visit | Attending: Radiation Oncology | Admitting: Radiation Oncology

## 2012-05-12 VITALS — BP 136/101 | HR 100 | Temp 98.3°F | Wt 330.0 lb

## 2012-05-12 DIAGNOSIS — C50911 Malignant neoplasm of unspecified site of right female breast: Secondary | ICD-10-CM

## 2012-05-12 DIAGNOSIS — C50411 Malignant neoplasm of upper-outer quadrant of right female breast: Secondary | ICD-10-CM

## 2012-05-12 MED ORDER — RADIAPLEXRX EX GEL
Freq: Once | CUTANEOUS | Status: AC
  Administered 2012-05-12: 13:00:00 via TOPICAL

## 2012-05-12 MED ORDER — ALRA NON-METALLIC DEODORANT (RAD-ONC)
1.0000 "application " | Freq: Once | TOPICAL | Status: AC
  Administered 2012-05-12: 1 via TOPICAL

## 2012-05-12 NOTE — Progress Notes (Signed)
[place]     Rexene Edison, M.D. [place], [place] [address]               Blair Promise, M.D., Ph.D. Phone: [phone]      [place] A. Tammi Klippel, M.D. Fax: [phone]      Jodelle Gross, M.D., Ph.D.         Thea Silversmith, M.D.         Wyvonnia Lora, M.D Weekly Treatment Management Note  Name: [name]     MRN: [id number]        CSN: [id number] Date: 05/12/2012      DOB: [birth year]  CC: Elyn Peers, MD         Criss Rosales    Status: Outpatient  Diagnosis: The primary encounter diagnosis was Breast cancer, right. A diagnosis of Cancer of upper-outer quadrant of female breast, right was also pertinent to this visit.  Current Dose: 9 Gy  Current Fraction: 5  Planned Dose: 61.2 Gy  Narrative: [name] was seen today for weekly treatment management. The chart was checked and port films  were reviewed. The she is tolerating her treatments well at this time except for some fatigue. She denies any itching or discomfort in the breast area.  Shellfish allergy; Morphine and related; and Oxycodone hcl  Current Outpatient Prescriptions  Medication Sig Dispense Refill  . albuterol (PROVENTIL HFA;VENTOLIN HFA) 108 (90 BASE) MCG/ACT inhaler Inhale 2 puffs into the lungs every 6 (six) hours as needed for wheezing or shortness of breath.       [name] amLODipine (NORVASC) 10 MG tablet Take 10 mg by mouth daily.      . ARIPiprazole (ABILIFY) 10 MG tablet Take 10 mg by mouth daily.      . budesonide-formoterol (SYMBICORT) 80-4.5 MCG/ACT inhaler Inhale 1-2 puffs into the lungs daily as needed. For shortness of breath      . buPROPion (WELLBUTRIN XL) 150 MG 24 hr tablet Take 300 mg by mouth daily.      [name] esomeprazole (NEXIUM) 40 MG capsule Take 40 mg by mouth daily before breakfast.      . HYDROcodone-acetaminophen (NORCO) 5-325 MG per tablet Take 1 tablet by mouth every 6 (six) hours as needed for pain.  30 tablet  0  .  HYDROcodone-acetaminophen (NORCO) 5-325 MG per tablet Take 2 tablets by mouth every 4 (four) hours as needed for pain.  20 tablet  0  . levothyroxine (SYNTHROID, LEVOTHROID) 200 MCG tablet Take 200 mcg by mouth daily.      [name] LORazepam (ATIVAN) 1 MG tablet Take 1 mg by mouth every 8 (eight) hours as needed. For anxiety      . meloxicam (MOBIC) 15 MG tablet Take 15 mg by mouth daily.      [name] PARoxetine (PAXIL) 20 MG tablet Take 30 mg by mouth every morning.      . pravastatin (PRAVACHOL) 40 MG tablet Take 80 mg by mouth at bedtime.      [name] QUEtiapine (SEROQUEL) 300 MG tablet Take 600 mg by mouth at bedtime.       . traZODone (DESYREL) 100 MG tablet Take 100 mg by mouth at bedtime.      . valACYclovir (VALTREX) 1000 MG tablet Take 1,000 mg by mouth daily.      [name] zolpidem (AMBIEN) 10 MG tablet Take 10 mg by mouth at bedtime as needed. For sleep  No current facility-administered medications for this encounter.   Labs:  Lab Results  Component Value Date   WBC 8.0 04/13/2012   HGB 12.2 04/13/2012   HCT 36.1 04/13/2012   MCV 84.5 04/13/2012   PLT 184 04/13/2012   Lab Results  Component Value Date   CREATININE 1.0 04/13/2012   BUN 11.7 04/13/2012   NA 141 04/13/2012   K 4.0 04/13/2012   CL 104 04/13/2012   CO2 28 04/13/2012   Lab Results  Component Value Date   ALT 12 04/13/2012   AST 9 04/13/2012   BILITOT <0.20 Repeated and Verified 04/13/2012    Physical Examination:  weight is 330 lb (149.687 kg). Her temperature is 98.3 F (36.8 C). Her blood pressure is 136/101 and her pulse is 100.    Wt Readings from Last 3 Encounters:  05/12/12 330 lb (149.687 kg)  03/30/12 344 lb 9.6 oz (156.31 kg)  03/24/12 340 lb (154.223 kg)    The right breast area shows no appreciable skin reaction at this time. Lungs - Normal respiratory effort, chest expands symmetrically. Lungs are clear to auscultation, no crackles or wheezes.  Heart has regular rhythm and rate  Abdomen is soft and non tender with  normal bowel sounds  Assessment:  Patient tolerating treatments well  Plan: Continue treatment per original radiation prescription

## 2012-05-12 NOTE — Progress Notes (Signed)
Patient for routine weekly assessment of right breast radiation.Completed 5 of 25 treatments.Given skin care handouts and products.Reviewed routine of clinic and some of side effects of care.To go in more detail on Friday, patient understands.No skin changes at this point.Has history of chronic back pain.

## 2012-05-13 ENCOUNTER — Ambulatory Visit
Admission: RE | Admit: 2012-05-13 | Discharge: 2012-05-13 | Disposition: A | Discharge status: Home / Self Care | Payer: Medicare Other | Source: Ambulatory Visit | Attending: Radiation Oncology | Admitting: Radiation Oncology

## 2012-05-14 ENCOUNTER — Ambulatory Visit
Admission: RE | Admit: 2012-05-14 | Discharge: 2012-05-14 | Disposition: A | Discharge status: Home / Self Care | Payer: Medicare Other | Source: Ambulatory Visit | Attending: Radiation Oncology | Admitting: Radiation Oncology

## 2012-05-17 ENCOUNTER — Ambulatory Visit
Admission: RE | Admit: 2012-05-17 | Discharge: 2012-05-17 | Disposition: A | Discharge status: Home / Self Care | Payer: Medicare Other | Source: Ambulatory Visit | Attending: Radiation Oncology | Admitting: Radiation Oncology

## 2012-05-18 ENCOUNTER — Ambulatory Visit: Payer: Medicare Other

## 2012-05-19 ENCOUNTER — Ambulatory Visit
Admission: RE | Admit: 2012-05-19 | Discharge: 2012-05-19 | Disposition: A | Discharge status: Home / Self Care | Payer: Medicare Other | Source: Ambulatory Visit | Attending: Radiation Oncology | Admitting: Radiation Oncology

## 2012-05-20 ENCOUNTER — Encounter: Payer: Self-pay | Admitting: Radiation Oncology

## 2012-05-20 ENCOUNTER — Ambulatory Visit
Admission: RE | Admit: 2012-05-20 | Discharge: 2012-05-20 | Disposition: A | Discharge status: Home / Self Care | Payer: Medicare Other | Source: Ambulatory Visit | Attending: Radiation Oncology | Admitting: Radiation Oncology

## 2012-05-20 VITALS — BP 147/96 | HR 91 | Temp 97.4°F | Resp 16 | Wt 343.2 lb

## 2012-05-20 DIAGNOSIS — C50411 Malignant neoplasm of upper-outer quadrant of right female breast: Secondary | ICD-10-CM

## 2012-05-20 NOTE — Progress Notes (Signed)
[place]     Rexene Edison, M.D. [place], [place] [address]               Blair Promise, M.D., Ph.D. Phone: [phone]      [place] A. Tammi Klippel, M.D. Fax: [phone]      Jodelle Gross, M.D., Ph.D.         Thea Silversmith, M.D.         Wyvonnia Lora, M.D Weekly Treatment Management Note  Name: [name]     MRN: [id number]        CSN: [id number] Date: 05/20/2012      DOB: November 25, 1955  CC: Elyn Peers, MD         Criss Rosales    Status: Outpatient  Diagnosis: The encounter diagnosis was Cancer of upper-outer quadrant of female breast, right.  Current Dose: 18 Gy  Current Fraction: 10  Planned Dose: 61.2 Gy  Narrative: [name] was seen today for weekly treatment management. The chart was checked and port films  were reviewed. She is having some fatigue at this time. She is also noticed some itching in the breast area. I have recommended she use hydrocortisone cream for the itching. She understands to put this on once or twice during the day. She will try oral Benadryl at night if she cannot sleep well.  Shellfish allergy; Morphine and related; and Oxycodone hcl Current Outpatient Prescriptions  Medication Sig Dispense Refill  . albuterol (PROVENTIL HFA;VENTOLIN HFA) 108 (90 BASE) MCG/ACT inhaler Inhale 2 puffs into the lungs every 6 (six) hours as needed for wheezing or shortness of breath.       [name] amLODipine (NORVASC) 10 MG tablet Take 10 mg by mouth daily.      . ARIPiprazole (ABILIFY) 10 MG tablet Take 10 mg by mouth daily.      . budesonide-formoterol (SYMBICORT) 80-4.5 MCG/ACT inhaler Inhale 1-2 puffs into the lungs daily as needed. For shortness of breath      . buPROPion (WELLBUTRIN XL) 150 MG 24 hr tablet Take 300 mg by mouth daily.      [name] esomeprazole (NEXIUM) 40 MG capsule Take 40 mg by mouth daily before breakfast.      . HYDROcodone-acetaminophen (NORCO) 5-325 MG per tablet Take 1 tablet by mouth  every 6 (six) hours as needed for pain.  30 tablet  0  . HYDROcodone-acetaminophen (NORCO) 5-325 MG per tablet Take 2 tablets by mouth every 4 (four) hours as needed for pain.  20 tablet  0  . levothyroxine (SYNTHROID, LEVOTHROID) 200 MCG tablet Take 200 mcg by mouth daily.      [name] LORazepam (ATIVAN) 1 MG tablet Take 1 mg by mouth every 8 (eight) hours as needed. For anxiety      . meloxicam (MOBIC) 15 MG tablet Take 15 mg by mouth daily.      . non-metallic deodorant Jethro Poling) MISC Apply 1 application topically daily as needed.      [name] PARoxetine (PAXIL) 20 MG tablet Take 30 mg by mouth every morning.      . pravastatin (PRAVACHOL) 40 MG tablet Take 80 mg by mouth at bedtime.      [name] QUEtiapine (SEROQUEL) 300 MG tablet Take 600 mg by mouth at bedtime.       . traZODone (DESYREL) 100 MG tablet Take 100 mg by mouth at bedtime.      . valACYclovir (VALTREX) 1000 MG tablet  Take 1,000 mg by mouth daily.      . Wound Cleansers (RADIAPLEX EX) Apply topically.      [name] zolpidem (AMBIEN) 10 MG tablet Take 10 mg by mouth at bedtime as needed. For sleep       No current facility-administered medications for this encounter.   Labs:  Lab Results  Component Value Date   WBC 8.0 04/13/2012   HGB 12.2 04/13/2012   HCT 36.1 04/13/2012   MCV 84.5 04/13/2012   PLT 184 04/13/2012   Lab Results  Component Value Date   CREATININE 1.0 04/13/2012   BUN 11.7 04/13/2012   NA 141 04/13/2012   K 4.0 04/13/2012   CL 104 04/13/2012   CO2 28 04/13/2012   Lab Results  Component Value Date   ALT 12 04/13/2012   AST 9 04/13/2012   BILITOT <0.20 Repeated and Verified 04/13/2012    Physical Examination:  weight is 343 lb 3.2 oz (155.674 kg). Her oral temperature is 97.4 F (36.3 C). Her blood pressure is 147/96 and her pulse is 91. Her respiration is 16.    Wt Readings from Last 3 Encounters:  05/20/12 343 lb 3.2 oz (155.674 kg)  05/12/12 330 lb (149.687 kg)  03/30/12 344 lb 9.6 oz (156.31 kg)    The right breast area  shows some hyperpigmentation changes. She has a prominent red birthmark in the medial to the upper aspect of the right breast. Lungs - Normal respiratory effort, chest expands symmetrically. Lungs are clear to auscultation, no crackles or wheezes.  Heart has regular rhythm and rate  Abdomen is soft and non tender with normal bowel sounds  Assessment:  Patient tolerating treatments well  Plan: Continue treatment per original radiation prescription

## 2012-05-20 NOTE — Progress Notes (Addendum)
Patient presents to the clinic today unaccompanied for PUT with Dr. Sondra Come. Patient is alert and oriented to person, place, and time. No distress noted. Patient being pushed in wheelchair due to generalized weakness. Patient denies breast pain at this time. Patient reports all over chronic body pain for which she takes mobic and excedrin. Only faint hyperpigmentation of right/treated breast noted. Patient denies right breast pain or nipple discharge. Patient reports using radiaplex and alra as directed. Patient reports fatigue. Reported all findings to Dr. Sondra Come.

## 2012-05-20 NOTE — Progress Notes (Signed)
Opened in error.

## 2012-05-21 ENCOUNTER — Ambulatory Visit
Admission: RE | Admit: 2012-05-21 | Discharge: 2012-05-21 | Disposition: A | Discharge status: Home / Self Care | Payer: Medicare Other | Source: Ambulatory Visit | Attending: Radiation Oncology | Admitting: Radiation Oncology

## 2012-05-24 ENCOUNTER — Ambulatory Visit
Admission: RE | Admit: 2012-05-24 | Discharge: 2012-05-24 | Disposition: A | Discharge status: Home / Self Care | Payer: Medicare Other | Source: Ambulatory Visit | Attending: Radiation Oncology | Admitting: Radiation Oncology

## 2012-05-25 ENCOUNTER — Encounter: Payer: Self-pay | Admitting: Radiation Oncology

## 2012-05-25 ENCOUNTER — Ambulatory Visit
Admission: RE | Admit: 2012-05-25 | Discharge: 2012-05-25 | Disposition: A | Discharge status: Home / Self Care | Payer: Medicare Other | Source: Ambulatory Visit | Attending: Radiation Oncology | Admitting: Radiation Oncology

## 2012-05-25 VITALS — BP 147/81 | HR 102 | Resp 18 | Wt 341.2 lb

## 2012-05-25 DIAGNOSIS — C50411 Malignant neoplasm of upper-outer quadrant of right female breast: Secondary | ICD-10-CM

## 2012-05-25 NOTE — Progress Notes (Signed)
Patient presents to the clinic today unaccompanied for PUT with Dr. Sondra Come. Patient is alert and oriented to person, place, and time. No distress noted. Patient being pushed in wheelchair due to weight and generalized weakness. Hyperpigmentation with very small amount of dry desquamation on the under side of the breast noted. Patient reports that she using radiaplex bid as directed. Patient reports fatigue. Patient reports persistent dry cough x3 days and muscle aches. Reported all findings to Dr. Sondra Come.

## 2012-05-25 NOTE — Progress Notes (Signed)
  Radiation Oncology         [phone] ________________________________  Name: [name] MRN: [id number]  Date: 05/25/2012  DOB: March 24, 1955  Weekly Radiation Therapy Management  Current Dose: 23.4 Gy     Planned Dose:  61.2 Gy  Narrative . . . . . . . . The patient presents for routine under treatment assessment.                                                     The patient is without complaint  Except for some fatigue and itching and discomfort in the breast area.                                 Set-up films were reviewed.                                 The chart was checked. Physical Findings. . .  weight is 341 lb 3.2 oz (154.767 kg). Her blood pressure is 147/81 and her pulse is 102. Her respiration is 18. . Weight essentially stable.  The right breast area shows some hyperpigmentation changes and erythema but no moist desquamation. Impression . . . . . . . The patient is  tolerating radiation. Plan . . . . . . . . . . . . Continue treatment as planned.  ________________________________   Blair Promise, PhD, MD

## 2012-05-26 ENCOUNTER — Ambulatory Visit
Admission: RE | Admit: 2012-05-26 | Discharge: 2012-05-26 | Disposition: A | Discharge status: Home / Self Care | Payer: Medicare Other | Source: Ambulatory Visit | Attending: Radiation Oncology | Admitting: Radiation Oncology

## 2012-05-27 ENCOUNTER — Ambulatory Visit
Admission: RE | Admit: 2012-05-27 | Discharge: 2012-05-27 | Disposition: A | Discharge status: Home / Self Care | Payer: Medicare Other | Source: Ambulatory Visit | Attending: Radiation Oncology | Admitting: Radiation Oncology

## 2012-05-28 ENCOUNTER — Ambulatory Visit
Admission: RE | Admit: 2012-05-28 | Discharge: 2012-05-28 | Disposition: A | Discharge status: Home / Self Care | Payer: Medicare Other | Source: Ambulatory Visit | Attending: Radiation Oncology | Admitting: Radiation Oncology

## 2012-05-29 ENCOUNTER — Encounter (HOSPITAL_COMMUNITY): Payer: Self-pay | Admitting: *Deleted

## 2012-05-29 ENCOUNTER — Emergency Department (HOSPITAL_COMMUNITY)
Admission: EM | Admit: 2012-05-29 | Discharge: 2012-05-29 | Disposition: A | Discharge status: Home / Self Care | Payer: Medicare Other | Attending: Emergency Medicine | Admitting: Emergency Medicine

## 2012-05-29 DIAGNOSIS — K219 Gastro-esophageal reflux disease without esophagitis: Secondary | ICD-10-CM | POA: Insufficient documentation

## 2012-05-29 DIAGNOSIS — Z8739 Personal history of other diseases of the musculoskeletal system and connective tissue: Secondary | ICD-10-CM | POA: Insufficient documentation

## 2012-05-29 DIAGNOSIS — Z87442 Personal history of urinary calculi: Secondary | ICD-10-CM | POA: Insufficient documentation

## 2012-05-29 DIAGNOSIS — Z510 Encounter for antineoplastic radiation therapy: Secondary | ICD-10-CM | POA: Insufficient documentation

## 2012-05-29 DIAGNOSIS — I10 Essential (primary) hypertension: Secondary | ICD-10-CM | POA: Insufficient documentation

## 2012-05-29 DIAGNOSIS — F329 Major depressive disorder, single episode, unspecified: Secondary | ICD-10-CM | POA: Insufficient documentation

## 2012-05-29 DIAGNOSIS — T300 Burn of unspecified body region, unspecified degree: Secondary | ICD-10-CM

## 2012-05-29 DIAGNOSIS — J45909 Unspecified asthma, uncomplicated: Secondary | ICD-10-CM | POA: Insufficient documentation

## 2012-05-29 DIAGNOSIS — E079 Disorder of thyroid, unspecified: Secondary | ICD-10-CM | POA: Insufficient documentation

## 2012-05-29 DIAGNOSIS — W880XXA Exposure to X-rays, initial encounter: Secondary | ICD-10-CM | POA: Insufficient documentation

## 2012-05-29 DIAGNOSIS — Z79899 Other long term (current) drug therapy: Secondary | ICD-10-CM | POA: Insufficient documentation

## 2012-05-29 DIAGNOSIS — E669 Obesity, unspecified: Secondary | ICD-10-CM | POA: Insufficient documentation

## 2012-05-29 DIAGNOSIS — Z87891 Personal history of nicotine dependence: Secondary | ICD-10-CM | POA: Insufficient documentation

## 2012-05-29 DIAGNOSIS — Z8619 Personal history of other infectious and parasitic diseases: Secondary | ICD-10-CM | POA: Insufficient documentation

## 2012-05-29 DIAGNOSIS — Z8719 Personal history of other diseases of the digestive system: Secondary | ICD-10-CM | POA: Insufficient documentation

## 2012-05-29 DIAGNOSIS — Z8679 Personal history of other diseases of the circulatory system: Secondary | ICD-10-CM | POA: Insufficient documentation

## 2012-05-29 DIAGNOSIS — E785 Hyperlipidemia, unspecified: Secondary | ICD-10-CM | POA: Insufficient documentation

## 2012-05-29 DIAGNOSIS — F411 Generalized anxiety disorder: Secondary | ICD-10-CM | POA: Insufficient documentation

## 2012-05-29 DIAGNOSIS — F3289 Other specified depressive episodes: Secondary | ICD-10-CM | POA: Insufficient documentation

## 2012-05-29 DIAGNOSIS — T66XXXA Radiation sickness, unspecified, initial encounter: Secondary | ICD-10-CM | POA: Insufficient documentation

## 2012-05-29 DIAGNOSIS — Z853 Personal history of malignant neoplasm of breast: Secondary | ICD-10-CM | POA: Insufficient documentation

## 2012-05-29 DIAGNOSIS — Y929 Unspecified place or not applicable: Secondary | ICD-10-CM | POA: Insufficient documentation

## 2012-05-29 DIAGNOSIS — Y939 Activity, unspecified: Secondary | ICD-10-CM | POA: Insufficient documentation

## 2012-05-29 DIAGNOSIS — G4730 Sleep apnea, unspecified: Secondary | ICD-10-CM | POA: Insufficient documentation

## 2012-05-29 MED ORDER — DIPHENHYDRAMINE HCL 50 MG/ML IJ SOLN
25.0000 mg | Freq: Once | INTRAMUSCULAR | Status: AC
  Administered 2012-05-29: 15:00:00 via INTRAMUSCULAR
  Filled 2012-05-29: qty 1

## 2012-05-29 MED ORDER — HYDROCODONE-ACETAMINOPHEN 5-325 MG PO TABS: 2.0000 | ORAL_TABLET | ORAL | Status: DC | PRN

## 2012-05-29 MED ORDER — HYDROMORPHONE HCL PF 1 MG/ML IJ SOLN
1.0000 mg | Freq: Once | INTRAMUSCULAR | Status: AC
  Administered 2012-05-29: 1 mg via INTRAMUSCULAR
  Filled 2012-05-29: qty 1

## 2012-05-29 MED ORDER — SILVER SULFADIAZINE 1 % EX CREA
TOPICAL_CREAM | Freq: Once | CUTANEOUS | Status: AC
  Administered 2012-05-29: 15:00:00 via TOPICAL
  Filled 2012-05-29: qty 50

## 2012-05-29 NOTE — ED Notes (Addendum)
Pt presents to ed with c/o blisters/radiation burns under her right arm and breast. Pt's right breast and axilla is red, warm to touch with blisters under and on the side of the breast.

## 2012-05-29 NOTE — ED Provider Notes (Signed)
History     CSN: [id number]  Arrival date & time 05/29/12  1400   First MD Initiated Contact with Patient 05/29/12 1431      No chief complaint on file.   (Consider location/radiation/quality/duration/timing/severity/associated sxs/prior treatment) The history is provided by the patient.   patient complains of pain to the skin under her right breast. She is currently undergoing radiation treatment for breast cancer and last treatment was yesterday. Denies any fever or chills. No cough shortness of breath. Called her Dr. was told to use Neosporin which he has without relief. Pain is characterized as burning and worse with movement and nothing makes it better.  Past Medical History  Diagnosis Date  . Depression   . Thyroid disease   . GERD (gastroesophageal reflux disease)   . Pancreatitis     hx of  . Knee pain, bilateral   . Obesity   . HSV (herpes simplex virus) infection   . Hyperlipemia   . Asthma   . Tachycardia     with sx in 2008  . Breast mass in female     L breast 2008, US showed likely fat necrosis  . Sleep apnea   . Arthritis   . Anemia   . Anxiety   . Headache   . Heartburn   . Hypertension     sees Dr. Criss Rosales , [facility]  . Kidney stones     hx of  . 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    Past Surgical History  Procedure Laterality Date  . Replacement total knee bilateral    . Foot surgery    . Abdominal hysterectomy    . Abdominal surgery    . Cholecystectomy    . Breast surgery    . Joint replacement    . Thyroid surgery    . Breast lumpectomy  03/03/2012    Procedure: LUMPECTOMY;  Surgeon: Haywood Lasso, MD;  Location: [facility] OR;  Service: General;  Laterality: Right;    Family History  Problem Relation Age of Onset  . Heart disease Brother   . Diabetes Brother   . Hypertension Mother   . Diabetes Mother   . Breast cancer Mother 44  . Bone cancer Mother   . Hypertension Sister    . Diabetes Sister   . Breast cancer Sister 63  . Breast cancer Maternal Grandmother   . Heart disease Maternal Grandmother   . Uterine cancer Other 19  . Breast cancer Paternal Aunt 75  . Breast cancer Paternal Grandmother     dx in her 53s  . Prostate cancer Paternal Grandfather     History  Substance Use Topics  . Smoking status: Former Smoker -- 1.00 packs/day for 7 years    Types: Cigarettes    Quit date: 03/24/1992  . Smokeless tobacco: Never Used  . Alcohol Use: No    OB History   Grav Para Term Preterm Abortions TAB SAB Ect Mult Living                  Review of Systems  All other systems reviewed and are negative.    Allergies  Shellfish allergy; Morphine and related; and Oxycodone hcl  Home Medications   Current Outpatient Rx  Name  Route  Sig  Dispense  Refill  . albuterol (PROVENTIL HFA;VENTOLIN HFA) 108 (90 BASE) MCG/ACT inhaler   Inhalation   Inhale 2 puffs into the  lungs every 6 (six) hours as needed for wheezing or shortness of breath.          [name] amLODipine (NORVASC) 10 MG tablet   Oral   Take 10 mg by mouth daily.         . ARIPiprazole (ABILIFY) 10 MG tablet   Oral   Take 10 mg by mouth daily.         . budesonide-formoterol (SYMBICORT) 80-4.5 MCG/ACT inhaler   Inhalation   Inhale 1-2 puffs into the lungs daily as needed. For shortness of breath         . buPROPion (WELLBUTRIN XL) 150 MG 24 hr tablet   Oral   Take 300 mg by mouth daily.         [name] esomeprazole (NEXIUM) 40 MG capsule   Oral   Take 40 mg by mouth daily before breakfast.         . HYDROcodone-acetaminophen (NORCO) 5-325 MG per tablet   Oral   Take 1 tablet by mouth every 6 (six) hours as needed for pain.   30 tablet   0   . HYDROcodone-acetaminophen (NORCO) 5-325 MG per tablet   Oral   Take 2 tablets by mouth every 4 (four) hours as needed for pain.   20 tablet   0   . levothyroxine (SYNTHROID, LEVOTHROID) 200 MCG tablet   Oral   Take 200 mcg by  mouth daily.         [name] LORazepam (ATIVAN) 1 MG tablet   Oral   Take 1 mg by mouth every 8 (eight) hours as needed. For anxiety         . meloxicam (MOBIC) 15 MG tablet   Oral   Take 15 mg by mouth daily.         . non-metallic deodorant Jethro Poling) MISC   Topical   Apply 1 application topically daily as needed.         [name] PARoxetine (PAXIL) 20 MG tablet   Oral   Take 30 mg by mouth every morning.         . pravastatin (PRAVACHOL) 40 MG tablet   Oral   Take 80 mg by mouth at bedtime.         [name] QUEtiapine (SEROQUEL) 300 MG tablet   Oral   Take 600 mg by mouth at bedtime.          . traZODone (DESYREL) 100 MG tablet   Oral   Take 100 mg by mouth at bedtime.         . valACYclovir (VALTREX) 1000 MG tablet   Oral   Take 1,000 mg by mouth daily.         . Wound Cleansers (RADIAPLEX EX)   Apply externally   Apply topically.         [name] zolpidem (AMBIEN) 10 MG tablet   Oral   Take 10 mg by mouth at bedtime as needed. For sleep           BP 147/74  Pulse 93  Temp(Src) 98.2 F (36.8 C)  Resp 20  SpO2 99%  Physical Exam  Nursing note and vitals reviewed. Constitutional: She is oriented to person, place, and time. She appears well-developed and well-nourished.  Non-toxic appearance. No distress.  HENT:  Head: Normocephalic and atraumatic.  Eyes: Conjunctivae, EOM and lids are normal. Pupils are equal, round, and reactive to light.  Neck: Normal range of motion. Neck supple. No tracheal deviation present. No  mass present.  Cardiovascular: Normal rate, regular rhythm and normal heart sounds.  Exam reveals no gallop.   No murmur heard. Pulmonary/Chest: Effort normal and breath sounds normal. No stridor. No respiratory distress. She has no decreased breath sounds. She has no wheezes. She has no rhonchi. She has no rales.    Abdominal: Soft. Normal appearance and bowel sounds are normal. She exhibits no distension. There is no tenderness. There is no  rebound and no CVA tenderness.  Musculoskeletal: Normal range of motion. She exhibits no edema and no tenderness.  Neurological: She is alert and oriented to person, place, and time. She has normal strength. No cranial nerve deficit or sensory deficit. GCS eye subscore is 4. GCS verbal subscore is 5. GCS motor subscore is 6.  Skin:  See chest wakll exam  Psychiatric: She has a normal mood and affect. Her speech is normal and behavior is normal.    ED Course  Procedures (including critical care time)  Labs Reviewed - No data to display No results found.   No diagnosis found.    MDM  Radiation burn dressed with silvidine by nursing--pain meds given ( pt states that she is able to take dialudid)--will give pt burn care instructions and pain meds--she will see her cancer MD in 2 days        Leota Jacobsen, MD 05/29/12 1448

## 2012-05-31 ENCOUNTER — Ambulatory Visit
Admission: RE | Admit: 2012-05-31 | Discharge: 2012-05-31 | Disposition: A | Discharge status: Home / Self Care | Payer: Medicare Other | Source: Ambulatory Visit | Attending: Radiation Oncology | Admitting: Radiation Oncology

## 2012-05-31 ENCOUNTER — Encounter: Payer: Self-pay | Admitting: Radiation Oncology

## 2012-05-31 ENCOUNTER — Ambulatory Visit: Payer: Medicare Other

## 2012-05-31 ENCOUNTER — Telehealth: Payer: Self-pay | Admitting: Radiation Oncology

## 2012-05-31 VITALS — BP 152/93 | HR 92 | Temp 97.5°F | Resp 18

## 2012-05-31 DIAGNOSIS — C50411 Malignant neoplasm of upper-outer quadrant of right female breast: Secondary | ICD-10-CM

## 2012-05-31 NOTE — Progress Notes (Signed)
Dr. Sondra Come provided patient with Norco 5/325 mg 2 tablets every four hours prn pain. Qty 100. No refills.

## 2012-05-31 NOTE — Telephone Encounter (Signed)
Patient reports that over the weekend she presented to the emergency department due to right breast pain. Patient reports taking excedrin to help relieve this pain but, "it didn't help." Patient reports that she was told in the emergency room that she has "second degrees burns to her right breast." Encouraged patient to presents to the clinic prior to radiation treatment for evaluation of skin. Patient refused to come today but, reports she would "try tomorrow." patient requested pain medication to relieve right breast pain. Explain to patient she would have to be evaluated before any pain medication could be prescribed. Patient decided to present today at 1100 for skin check. Routed message to Dr. Sondra Come.

## 2012-05-31 NOTE — Progress Notes (Signed)
[place]     Rexene Edison, M.D. [place], [place] [address]               Blair Promise, M.D., Ph.D. Phone: [phone]      [place] A. Tammi Klippel, M.D. Fax: [phone]      Jodelle Gross, M.D., Ph.D.         Thea Silversmith, M.D.         Wyvonnia Lora, M.D Weekly Treatment Management Note  Name: [name]     MRN: [id number]        CSN: [id number] Date: 05/31/2012      DOB: 03/06/1955  CC: Tammie Peers, MD         [name]    Status: Outpatient  Diagnosis: The encounter diagnosis was Cancer of upper-outer quadrant of female breast, right.  Current Dose: 28.8 Gy  Current Fraction: 16  Planned Dose: 61.2 Gy  Narrative: [name] was seen today for weekly treatment management. The chart was checked and port films  were reviewed. She requested be seen prior to her radiation therapy today. Over the weekend she developed significant pain in the right breast and was seen in the emergency room. She was given Norco for her pain. She was also given Silvadene to place on her skin breakdown.  Shellfish allergy; Morphine and related; and Oxycodone hcl  Current Outpatient Prescriptions  Medication Sig Dispense Refill  . albuterol (PROVENTIL HFA;VENTOLIN HFA) 108 (90 BASE) MCG/ACT inhaler Inhale 2 puffs into the lungs every 6 (six) hours as needed for wheezing or shortness of breath.       [name] amLODipine (NORVASC) 10 MG tablet Take 10 mg by mouth daily.      . ARIPiprazole (ABILIFY) 10 MG tablet Take 10 mg by mouth daily.      . budesonide-formoterol (SYMBICORT) 80-4.5 MCG/ACT inhaler Inhale 1-2 puffs into the lungs daily as needed. For shortness of breath      . buPROPion (WELLBUTRIN XL) 150 MG 24 hr tablet Take 300 mg by mouth daily.      [name] esomeprazole (NEXIUM) 40 MG capsule Take 40 mg by mouth daily before breakfast.      . HYDROcodone-acetaminophen (NORCO) 5-325 MG per tablet Take 2 tablets by mouth every 4 (four)  hours as needed for pain.  20 tablet  0  . levothyroxine (SYNTHROID, LEVOTHROID) 200 MCG tablet Take 200 mcg by mouth daily.      [name] LORazepam (ATIVAN) 1 MG tablet Take 1 mg by mouth every 8 (eight) hours as needed. For anxiety      . meloxicam (MOBIC) 15 MG tablet Take 15 mg by mouth daily.      . non-metallic deodorant Jethro Poling) MISC Apply 1 application topically daily as needed.      [name] PARoxetine (PAXIL) 20 MG tablet Take 30 mg by mouth every morning.      . pravastatin (PRAVACHOL) 40 MG tablet Take 80 mg by mouth at bedtime.      [name] QUEtiapine (SEROQUEL) 300 MG tablet Take 600 mg by mouth at bedtime.       . silver sulfADIAZINE (SILVADENE) 1 % cream Apply topically daily.      . traZODone (DESYREL) 100 MG tablet Take 100 mg by mouth at bedtime.      . valACYclovir (VALTREX) 1000 MG tablet Take 1,000 mg by mouth daily.      . Wound Cleansers (RADIAPLEX EX)  Apply topically.      [name] zolpidem (AMBIEN) 10 MG tablet Take 10 mg by mouth at bedtime as needed. For sleep      . HYDROcodone-acetaminophen (NORCO) 5-325 MG per tablet Take 1 tablet by mouth every 6 (six) hours as needed for pain.  30 tablet  0   No current facility-administered medications for this encounter.      Physical Examination:  oral temperature is 97.5 F (36.4 C). Her blood pressure is 152/93 and her pulse is 92. Her respiration is 18 and oxygen saturation is 100%.    Wt Readings from Last 3 Encounters:  05/25/12 341 lb 3.2 oz (154.767 kg)  05/20/12 343 lb 3.2 oz (155.674 kg)  05/12/12 330 lb (149.687 kg)    The right breast area shows hyperpigmentation changes. There is a small area of moist desquamation in the inframammary fold Lungs - Normal respiratory effort, chest expands symmetrically. Lungs are clear to auscultation, no crackles or wheezes.  Heart has regular rhythm and rate  Abdomen is soft and non tender with normal bowel sounds  Assessment:  Patient is having side effects as above with her treatment. In light  of her performance status and fatigue she would like to remain out of treatment this week to aid with healing and fatigue issues.  the patient will return prior to her next treatment on Monday for skin check. She will continue on Silvadene 2- 3 times per day.  I did refill the patient's pain medication today which should last her for at least 7-8 days.

## 2012-05-31 NOTE — Progress Notes (Signed)
Patient presented to the clinic today for skin check. Patient alert and oriented to person, place, and time. No distress noted. Patient being pushed in wheelchair due to shortness of breath and generalized weakness. Flat affect noted. Patient reports right breast pain 10 on a scale of 0-10. Patient reports presenting to the emergency department over the weekend for right breast pain relief. Patient reports that she was prescribed norco 5/325 mg two tablets every four hours but, only has "two pills left." Patient requesting more pain medication. Hyperpigmentation of right breast and axilla noted. Dime size area of skin breakdown noted under right breast. Patient reports using silvadene given in ER on area of breakdown after cleansing it with normal saline. Reported all findings to Dr. Sondra Come.

## 2012-05-31 NOTE — Addendum Note (Signed)
Encounter addended by: Heywood Footman, RN on: 05/31/2012  5:27 PM<BR>     Documentation filed: Notes Section

## 2012-05-31 NOTE — Progress Notes (Signed)
   Department of Radiation Oncology  Phone:  [phone] Fax:        [phone]  Simulation note  On 05/27/2012  the patient underwent additional planning for radiation therapy directed at the breast area. Patient's treatment planning CT scan was reviewed and she had set up of a 3 field photon boost directed at the lumpectomy cavity within the breast area. Custom blocking will be used on all 3 fields. A computerized isodose plan will be generated for treatment.  -----------------------------------  Blair Promise, PhD, MD

## 2012-06-01 ENCOUNTER — Ambulatory Visit (INDEPENDENT_AMBULATORY_CARE_PROVIDER_SITE_OTHER): Payer: Medicare Other | Admitting: Surgery

## 2012-06-01 ENCOUNTER — Ambulatory Visit: Payer: Medicare Other

## 2012-06-01 ENCOUNTER — Encounter (INDEPENDENT_AMBULATORY_CARE_PROVIDER_SITE_OTHER): Payer: Self-pay | Admitting: Surgery

## 2012-06-01 VITALS — BP 144/84 | HR 76 | Temp 97.6°F | Resp 20 | Ht 66.0 in | Wt 357.0 lb

## 2012-06-01 DIAGNOSIS — C50419 Malignant neoplasm of upper-outer quadrant of unspecified female breast: Secondary | ICD-10-CM

## 2012-06-01 DIAGNOSIS — C50411 Malignant neoplasm of upper-outer quadrant of right female breast: Secondary | ICD-10-CM

## 2012-06-01 NOTE — Progress Notes (Signed)
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 She has been doing overall well until she started radiation. She has had significant discomfort from the radiation with skin changes and has had to stop the radiation for a few days. This is generally her only postoperative issue.  Past history, family history are all noted in the electronic medical record not redictated here.  Exam: Vital signs:BP 144/84  Pulse 76  Temp(Src) 97.6 F (36.4 C) (Temporal)  Resp 20  Ht _0  (1.676 m)  Wt 357 lb (161.934 kg)  BMI 57.65 kg/m2 Gen.: Patient alert oriented healthy-appearing Breasts: There is an acute radiation changes with redness and medial aspect of the right breast and some changes on the inframammary fold with loss of the dermal layer. There is no cellulitis or other infection. Incision is healing nicely.  Impression: Stable exam with acute radiation changes  Plan: I'll see her back in 3 months so we can get a baseline post radiation exam.

## 2012-06-01 NOTE — Patient Instructions (Signed)
See me again in three months

## 2012-06-02 ENCOUNTER — Ambulatory Visit: Payer: Medicare Other

## 2012-06-02 NOTE — Addendum Note (Signed)
Encounter addended by: Heywood Footman, RN on: 06/02/2012 10:52 AM<BR>     Documentation filed: Notes Section

## 2012-06-02 NOTE — Progress Notes (Signed)
Patient given by Dr. Sondra Come a script for Norco 5/325 mg two tablets every four hours prn pain. Qty 100. No refills.

## 2012-06-03 ENCOUNTER — Ambulatory Visit: Payer: Medicare Other

## 2012-06-04 ENCOUNTER — Emergency Department (HOSPITAL_COMMUNITY)
Admission: EM | Admit: 2012-06-04 | Discharge: 2012-06-04 | Disposition: A | Discharge status: Home / Self Care | Payer: Medicare Other | Attending: Emergency Medicine | Admitting: Emergency Medicine

## 2012-06-04 ENCOUNTER — Ambulatory Visit: Payer: Medicare Other

## 2012-06-04 ENCOUNTER — Encounter (HOSPITAL_COMMUNITY): Payer: Self-pay | Admitting: Emergency Medicine

## 2012-06-04 ENCOUNTER — Telehealth: Payer: Self-pay | Admitting: Radiation Oncology

## 2012-06-04 DIAGNOSIS — F329 Major depressive disorder, single episode, unspecified: Secondary | ICD-10-CM | POA: Insufficient documentation

## 2012-06-04 DIAGNOSIS — Y939 Activity, unspecified: Secondary | ICD-10-CM | POA: Insufficient documentation

## 2012-06-04 DIAGNOSIS — M129 Arthropathy, unspecified: Secondary | ICD-10-CM | POA: Insufficient documentation

## 2012-06-04 DIAGNOSIS — T2121XA Burn of second degree of chest wall, initial encounter: Secondary | ICD-10-CM | POA: Insufficient documentation

## 2012-06-04 DIAGNOSIS — F411 Generalized anxiety disorder: Secondary | ICD-10-CM | POA: Insufficient documentation

## 2012-06-04 DIAGNOSIS — Z79899 Other long term (current) drug therapy: Secondary | ICD-10-CM | POA: Insufficient documentation

## 2012-06-04 DIAGNOSIS — Z9889 Other specified postprocedural states: Secondary | ICD-10-CM | POA: Insufficient documentation

## 2012-06-04 DIAGNOSIS — Z87891 Personal history of nicotine dependence: Secondary | ICD-10-CM | POA: Insufficient documentation

## 2012-06-04 DIAGNOSIS — Z8739 Personal history of other diseases of the musculoskeletal system and connective tissue: Secondary | ICD-10-CM | POA: Insufficient documentation

## 2012-06-04 DIAGNOSIS — I10 Essential (primary) hypertension: Secondary | ICD-10-CM | POA: Insufficient documentation

## 2012-06-04 DIAGNOSIS — Z791 Long term (current) use of non-steroidal anti-inflammatories (NSAID): Secondary | ICD-10-CM | POA: Insufficient documentation

## 2012-06-04 DIAGNOSIS — C50919 Malignant neoplasm of unspecified site of unspecified female breast: Secondary | ICD-10-CM | POA: Insufficient documentation

## 2012-06-04 DIAGNOSIS — Z862 Personal history of diseases of the blood and blood-forming organs and certain disorders involving the immune mechanism: Secondary | ICD-10-CM | POA: Insufficient documentation

## 2012-06-04 DIAGNOSIS — W881XXA Exposure to radioactive isotopes, initial encounter: Secondary | ICD-10-CM | POA: Insufficient documentation

## 2012-06-04 DIAGNOSIS — Z87442 Personal history of urinary calculi: Secondary | ICD-10-CM | POA: Insufficient documentation

## 2012-06-04 DIAGNOSIS — K219 Gastro-esophageal reflux disease without esophagitis: Secondary | ICD-10-CM | POA: Insufficient documentation

## 2012-06-04 DIAGNOSIS — E079 Disorder of thyroid, unspecified: Secondary | ICD-10-CM | POA: Insufficient documentation

## 2012-06-04 DIAGNOSIS — Z8619 Personal history of other infectious and parasitic diseases: Secondary | ICD-10-CM | POA: Insufficient documentation

## 2012-06-04 DIAGNOSIS — E669 Obesity, unspecified: Secondary | ICD-10-CM | POA: Insufficient documentation

## 2012-06-04 DIAGNOSIS — Z8679 Personal history of other diseases of the circulatory system: Secondary | ICD-10-CM | POA: Insufficient documentation

## 2012-06-04 DIAGNOSIS — Y9289 Other specified places as the place of occurrence of the external cause: Secondary | ICD-10-CM | POA: Insufficient documentation

## 2012-06-04 DIAGNOSIS — Z8719 Personal history of other diseases of the digestive system: Secondary | ICD-10-CM | POA: Insufficient documentation

## 2012-06-04 DIAGNOSIS — G4730 Sleep apnea, unspecified: Secondary | ICD-10-CM | POA: Insufficient documentation

## 2012-06-04 DIAGNOSIS — E785 Hyperlipidemia, unspecified: Secondary | ICD-10-CM | POA: Insufficient documentation

## 2012-06-04 DIAGNOSIS — F3289 Other specified depressive episodes: Secondary | ICD-10-CM | POA: Insufficient documentation

## 2012-06-04 DIAGNOSIS — T2111XS Burn of first degree of chest wall, sequela: Secondary | ICD-10-CM

## 2012-06-04 DIAGNOSIS — T2121XD Burn of second degree of chest wall, subsequent encounter: Secondary | ICD-10-CM

## 2012-06-04 DIAGNOSIS — J45909 Unspecified asthma, uncomplicated: Secondary | ICD-10-CM | POA: Insufficient documentation

## 2012-06-04 MED ORDER — HYDROCODONE-ACETAMINOPHEN 5-325 MG PO TABS: 1.0000 | ORAL_TABLET | ORAL | Status: DC | PRN

## 2012-06-04 MED ORDER — SILVER SULFADIAZINE 1 % EX CREA: TOPICAL_CREAM | Freq: Every day | CUTANEOUS | Status: DC

## 2012-06-04 MED ORDER — HYDROCODONE-ACETAMINOPHEN 5-325 MG PO TABS
2.0000 | ORAL_TABLET | Freq: Once | ORAL | Status: AC
  Administered 2012-06-04: 2 via ORAL
  Filled 2012-06-04: qty 2

## 2012-06-04 NOTE — ED Notes (Signed)
Pt complains of burns from radiation to right axillary area and under right breast. Pt is currently being treated for breast cancer. Pt states "My blisters ruptured and I am in pain"

## 2012-06-04 NOTE — ED Provider Notes (Signed)
History     CSN: [id number]  Arrival date & time 06/04/12  62   First MD Initiated Contact with Patient 06/04/12 1545      Chief Complaint  Patient presents with  . Burn    (Consider location/radiation/quality/duration/timing/severity/associated sxs/prior treatment) Patient is a 57 y.o. female presenting with burn. The history is provided by the patient.  Burn The burns are located on the chest (She is receiving daily radiation treatments for right breast cancer. She complains of 1st and 2nd degree burns to breast area.).    Past Medical History  Diagnosis Date  . Depression   . Thyroid disease   . GERD (gastroesophageal reflux disease)   . Pancreatitis     hx of  . Knee pain, bilateral   . Obesity   . HSV (herpes simplex virus) infection   . Hyperlipemia   . Asthma   . Tachycardia     with sx in 2008  . Breast mass in female     L breast 2008, US showed likely fat necrosis  . Sleep apnea   . Arthritis   . Anemia   . Anxiety   . Headache   . Heartburn   . Hypertension     sees Dr. Criss Rosales , [facility] [place]  . Kidney stones     hx of  . 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    Past Surgical History  Procedure Laterality Date  . Replacement total knee bilateral    . Foot surgery    . Abdominal hysterectomy    . Abdominal surgery    . Cholecystectomy    . Breast surgery    . Joint replacement    . Thyroid surgery    . Breast lumpectomy  03/03/2012    Procedure: LUMPECTOMY;  Surgeon: Haywood Lasso, MD;  Location: [facility] OR;  Service: General;  Laterality: Right;    Family History  Problem Relation Age of Onset  . Heart disease Brother   . Diabetes Brother   . Hypertension Mother   . Diabetes Mother   . Breast cancer Mother 21  . Bone cancer Mother   . Hypertension Sister   . Diabetes Sister   . Breast cancer Sister 58  . Breast cancer Maternal Grandmother   . Heart disease Maternal Grandmother    . Uterine cancer Other 19  . Breast cancer Paternal Aunt 54  . Breast cancer Paternal Grandmother     dx in her 54s  . Prostate cancer Paternal Grandfather     History  Substance Use Topics  . Smoking status: Former Smoker -- 1.00 packs/day for 7 years    Types: Cigarettes    Quit date: 03/24/1992  . Smokeless tobacco: Never Used  . Alcohol Use: No    OB History   Grav Para Term Preterm Abortions TAB SAB Ect Mult Living                  Review of Systems  Constitutional: Negative for fever.  Respiratory: Negative for shortness of breath.   Cardiovascular: Negative for chest pain.  Skin: Positive for wound.  Neurological: Negative for numbness.    Allergies  Shellfish allergy; Morphine and related; and Oxycodone hcl  Home Medications   Current Outpatient Rx  Name  Route  Sig  Dispense  Refill  . albuterol (PROVENTIL HFA;VENTOLIN HFA) 108 (90 BASE) MCG/ACT inhaler   Inhalation   Inhale 2  puffs into the lungs every 6 (six) hours as needed for wheezing or shortness of breath.          [name] amLODipine (NORVASC) 10 MG tablet   Oral   Take 10 mg by mouth daily.         . ARIPiprazole (ABILIFY) 10 MG tablet   Oral   Take 10 mg by mouth daily.         . budesonide-formoterol (SYMBICORT) 80-4.5 MCG/ACT inhaler   Inhalation   Inhale 1-2 puffs into the lungs daily as needed. For shortness of breath         . buPROPion (WELLBUTRIN XL) 150 MG 24 hr tablet   Oral   Take 300 mg by mouth daily.         [name] esomeprazole (NEXIUM) 40 MG capsule   Oral   Take 40 mg by mouth daily before breakfast.         . HYDROcodone-acetaminophen (NORCO) 5-325 MG per tablet   Oral   Take 2 tablets by mouth every 4 (four) hours as needed for pain.   20 tablet   0   . levothyroxine (SYNTHROID, LEVOTHROID) 200 MCG tablet   Oral   Take 200 mcg by mouth daily.         [name] LORazepam (ATIVAN) 1 MG tablet   Oral   Take 1 mg by mouth every 8 (eight) hours as needed. For  anxiety         . meloxicam (MOBIC) 15 MG tablet   Oral   Take 15 mg by mouth daily.         . non-metallic deodorant Jethro Poling) MISC   Topical   Apply 1 application topically daily as needed.         [name] PARoxetine (PAXIL) 20 MG tablet   Oral   Take 30 mg by mouth every morning.         . pravastatin (PRAVACHOL) 40 MG tablet   Oral   Take 80 mg by mouth at bedtime.         [name] QUEtiapine (SEROQUEL) 300 MG tablet   Oral   Take 600 mg by mouth at bedtime.          . silver sulfADIAZINE (SILVADENE) 1 % cream   Topical   Apply topically daily.         . traZODone (DESYREL) 100 MG tablet   Oral   Take 100 mg by mouth at bedtime.         . valACYclovir (VALTREX) 1000 MG tablet   Oral   Take 1,000 mg by mouth daily.         . Wound Cleansers (RADIAPLEX EX)   Apply externally   Apply topically.         [name] zolpidem (AMBIEN) 10 MG tablet   Oral   Take 10 mg by mouth at bedtime as needed. For sleep           BP 171/88  Pulse 98  Temp(Src) 98.3 F (36.8 C) (Oral)  Resp 18  SpO2 96%  Physical Exam  Constitutional: She is oriented to person, place, and time. She appears well-developed and well-nourished. No distress.  Pulmonary/Chest: Effort normal. She has no wheezes. She has no rales.  Abdominal: There is no tenderness.  Neurological: She is alert and oriented to person, place, and time.  Skin:  Open second degree lesion inferior right breast with mild purulent drainage. Minimal surrounding redness. No swelling. Diffuse redness  mid- and right inferior breast as well as to upper outer quadrant right breast consistent with 1st degree burn.  Psychiatric: She has a normal mood and affect.    ED Course  Procedures (including critical care time)  Labs Reviewed - No data to display No results found.   No diagnosis found. 1. Burn, right breast.   MDM  Patient receiving radiation treatment for breast cancer having problems with skin burns and skin  breakdown secondary to radiation. She was seen on the 14th by Dr. Sondra Come, the 15th by the emergency department and returns today for same. No fever. Feel she can be treated by burn/wound care and pain management.        Dewaine Oats, PA-C 06/04/12 1612

## 2012-06-04 NOTE — Telephone Encounter (Signed)
Received call from patient. Patient states,"I have one large blister that burst and two small ones; so I won't be able to come in for radiation on Monday." Questioned if patient would like to be evaluated by another physician today but, she declined. Encouraged patient to continue taking pain medication prescribed by Dr. Sondra Come and Silvadene. Patient verbalized understanding. Patient agreed to present Monday, June 07, 2012 at 1515 for skin check s/p break. Routed this message to Dr. Sondra Come.

## 2012-06-05 NOTE — ED Provider Notes (Signed)
Medical screening examination/treatment/procedure(s) were performed by non-physician practitioner and as supervising physician I was immediately available for consultation/collaboration.   Mirna Mires, MD 06/05/12 [id number]

## 2012-06-07 ENCOUNTER — Encounter: Payer: Self-pay | Admitting: Radiation Oncology

## 2012-06-07 ENCOUNTER — Ambulatory Visit
Admission: RE | Admit: 2012-06-07 | Discharge: 2012-06-07 | Disposition: A | Discharge status: Home / Self Care | Payer: Medicare Other | Source: Ambulatory Visit | Attending: Radiation Oncology | Admitting: Radiation Oncology

## 2012-06-07 ENCOUNTER — Ambulatory Visit: Payer: Medicare Other

## 2012-06-07 VITALS — BP 141/76 | HR 89 | Temp 98.0°F | Resp 20 | Wt 349.0 lb

## 2012-06-07 DIAGNOSIS — C50411 Malignant neoplasm of upper-outer quadrant of right female breast: Secondary | ICD-10-CM

## 2012-06-07 NOTE — Progress Notes (Addendum)
Pt here for skin check FU break from radiation treatment. Pt c/o pain 8/10 of her skin right breast, axilla. She takes Norco q 4 hrs, states pain will go from 10 to 8 on pain scale. She c/o occasional sharp pains in right breast, itching, feeling heat in her breast. She is applying Silvadene 3-4 x daily, Radiaplex to axilla. She has redness of right breast, hyperpigmentation in axilla. She states she is peeling under her breast.   Per Dr Sondra Come, applied Hydrogel pad under pt's right breast.  Gave pt Radiaplex, Silvadene. Pt on treatment break until 06/14/12. [facility] 3 notified.

## 2012-06-07 NOTE — Progress Notes (Signed)
[place]     Rexene Edison, M.D. [place], [place] [address]               Blair Promise, M.D., Ph.D. Phone: [phone]      [place] A. Tammi Klippel, M.D. Fax: [phone]      Jodelle Gross, M.D., Ph.D.         Thea Silversmith, M.D.         Wyvonnia Lora, M.D Weekly Treatment Management Note  Name: [name]     MRN: [id number]        CSN: [id number] Date: 06/07/2012      DOB: November 06, 1955  CC: Elyn Peers, MD         Criss Rosales    Status: Outpatient  Diagnosis: The encounter diagnosis was Cancer of upper-outer quadrant of female breast, right.  Current Dose: 28.8 Gy  Current Fraction: 16  Planned Dose: 61.2 Gy  Narrative: NANCIE BOCANEGRA was seen today for further assessment. She has been on break in therapy since April 11. She was having pain in the right breast and developed moist desquamation in the inframammary fold area. The patient has been placing Silvadene in the inframammary fold to 3 times daily. She is also using  RadiaPlex for the remainder of her skin which shows dry desquamation. The patient did present to the emergency room for  pain in the right breast. It was recommended that she continue her pain medication and Silvadene.  She does have a significant amount of fatigue.  Shellfish allergy; Morphine and related; and Oxycodone hcl  Current Outpatient Prescriptions  Medication Sig Dispense Refill  . albuterol (PROVENTIL HFA;VENTOLIN HFA) 108 (90 BASE) MCG/ACT inhaler Inhale 2 puffs into the lungs every 6 (six) hours as needed for wheezing or shortness of breath.       . ARIPiprazole (ABILIFY) 10 MG tablet Take 10 mg by mouth every morning.       . budesonide-formoterol (SYMBICORT) 80-4.5 MCG/ACT inhaler Inhale 1-2 puffs into the lungs daily as needed. For shortness of breath      . buPROPion (WELLBUTRIN XL) 150 MG 24 hr tablet Take 300 mg by mouth daily.      [name] esomeprazole (NEXIUM) 40 MG capsule Take  40 mg by mouth daily before breakfast.      . HYDROcodone-acetaminophen (NORCO) 5-325 MG per tablet Take 2 tablets by mouth every 4 (four) hours as needed for pain.  20 tablet  0  . HYDROcodone-acetaminophen (NORCO/VICODIN) 5-325 MG per tablet Take 1-2 tablets by mouth every 4 (four) hours as needed for pain.  25 tablet  0  . levothyroxine (SYNTHROID, LEVOTHROID) 200 MCG tablet Take 200 mcg by mouth every morning.       [name] LORazepam (ATIVAN) 1 MG tablet Take 1 mg by mouth every 8 (eight) hours as needed. For anxiety      . meloxicam (MOBIC) 15 MG tablet Take 15 mg by mouth every morning.       . non-metallic deodorant Jethro Poling) MISC Apply 1 application topically daily as needed.      [name] PARoxetine (PAXIL) 20 MG tablet Take 30 mg by mouth every morning.      . pravastatin (PRAVACHOL) 40 MG tablet Take 80 mg by mouth at bedtime.      [name] QUEtiapine (SEROQUEL) 300 MG tablet Take 600 mg by mouth at bedtime.       . silver sulfADIAZINE (SILVADENE) 1 %  cream Apply topically daily.      . silver sulfADIAZINE (SILVADENE) 1 % cream Apply topically daily.  50 g  0  . traZODone (DESYREL) 100 MG tablet Take 100 mg by mouth at bedtime.      . valACYclovir (VALTREX) 1000 MG tablet Take 1,000 mg by mouth daily.      [name] zolpidem (AMBIEN) 10 MG tablet Take 10 mg by mouth at bedtime as needed. For sleep       No current facility-administered medications for this encounter.   Labs:  Lab Results  Component Value Date   WBC 8.0 04/13/2012   HGB 12.2 04/13/2012   HCT 36.1 04/13/2012   MCV 84.5 04/13/2012   PLT 184 04/13/2012   Lab Results  Component Value Date   CREATININE 1.0 04/13/2012   BUN 11.7 04/13/2012   NA 141 04/13/2012   K 4.0 04/13/2012   CL 104 04/13/2012   CO2 28 04/13/2012   Lab Results  Component Value Date   ALT 12 04/13/2012   AST 9 04/13/2012   BILITOT <0.20 Repeated and Verified 04/13/2012    Physical Examination:  weight is 349 lb (158.305 kg). Her oral temperature is 98 F (36.7 C). Her blood  pressure is 141/76 and her pulse is 89. Her respiration is 20.    Wt Readings from Last 3 Encounters:  06/07/12 349 lb (158.305 kg)  06/01/12 357 lb (161.934 kg)  05/25/12 341 lb 3.2 oz (154.767 kg)    The right breast area shows hyperpigmentation changes and some dry desquamation. There is minimal moist desquamation in the inframammary fold area. There's no signs of infection in the breast. Lungs - Normal respiratory effort, chest expands symmetrically. Lungs are clear to auscultation, no crackles or wheezes.  Heart has regular rhythm and rate  Abdomen is soft and non tender with normal bowel sounds  Assessment:  Patient is having side effects with her treatment as above. She wishes to be on a further break and is considering discontinuing her radiation therapy and proceeding to hormonal therapy.  Patient was given a hydrogel dressing which did not seem to be very soothing for her today.  She will continue on Silvadene.  She will return for further evaluation in one week.

## 2012-06-08 ENCOUNTER — Ambulatory Visit: Payer: Medicare Other

## 2012-06-09 ENCOUNTER — Ambulatory Visit: Payer: Medicare Other

## 2012-06-10 ENCOUNTER — Ambulatory Visit: Payer: Medicare Other

## 2012-06-11 ENCOUNTER — Ambulatory Visit: Payer: Medicare Other

## 2012-06-11 ENCOUNTER — Telehealth: Payer: Self-pay | Admitting: Radiation Oncology

## 2012-06-11 NOTE — Telephone Encounter (Signed)
Returned patient's call. Patient reports that her right/treated breast continues to be "burned, painful and itchy." Patient requesting referral to "burning doctor to see if she should even continue with radiation therapy." Told Dr. Sondra Come would be informed of her request. Patient reports that she plans to keep appointment with Dr. Sondra Come on Monday, June 14, 2012. Encouraged patient to continue to use radiaplex, hydrocortisone and take pain medication over the weekend. Patient verbalized understanding. Routed message to Dr. Sondra Come.

## 2012-06-14 ENCOUNTER — Ambulatory Visit: Payer: Medicare Other

## 2012-06-14 ENCOUNTER — Ambulatory Visit
Admission: RE | Admit: 2012-06-14 | Discharge: 2012-06-14 | Disposition: A | Discharge status: Home / Self Care | Payer: Medicare Other | Source: Ambulatory Visit | Attending: Radiation Oncology | Admitting: Radiation Oncology

## 2012-06-14 ENCOUNTER — Ambulatory Visit: Payer: Medicare Other | Admitting: Radiation Oncology

## 2012-06-14 ENCOUNTER — Encounter: Payer: Self-pay | Admitting: Radiation Oncology

## 2012-06-14 VITALS — BP 131/104 | HR 93 | Temp 98.0°F | Resp 20 | Wt 349.0 lb

## 2012-06-14 DIAGNOSIS — Z923 Personal history of irradiation: Secondary | ICD-10-CM | POA: Insufficient documentation

## 2012-06-14 DIAGNOSIS — L538 Other specified erythematous conditions: Secondary | ICD-10-CM | POA: Insufficient documentation

## 2012-06-14 DIAGNOSIS — C50419 Malignant neoplasm of upper-outer quadrant of unspecified female breast: Secondary | ICD-10-CM | POA: Insufficient documentation

## 2012-06-14 DIAGNOSIS — C50411 Malignant neoplasm of upper-outer quadrant of right female breast: Secondary | ICD-10-CM

## 2012-06-14 DIAGNOSIS — L819 Disorder of pigmentation, unspecified: Secondary | ICD-10-CM | POA: Insufficient documentation

## 2012-06-14 MED ORDER — RADIAPLEXRX EX GEL
Freq: Once | CUTANEOUS | Status: AC
  Administered 2012-06-14: 16:00:00 via TOPICAL

## 2012-06-14 MED ORDER — SILVER SULFADIAZINE 1 % EX CREA
TOPICAL_CREAM | Freq: Once | CUTANEOUS | Status: AC
  Administered 2012-06-14: 16:00:00 via TOPICAL

## 2012-06-14 MED ORDER — HYDROCODONE-ACETAMINOPHEN 5-325 MG PO TABS
1.0000 | ORAL_TABLET | ORAL | Status: DC | PRN
Start: 2012-06-14 — End: 2012-06-22

## 2012-06-14 NOTE — Progress Notes (Signed)
  Radiation Oncology         [phone] ________________________________  Name: [name] MRN: [id number]  Date: 06/14/2012  DOB: Jul 08, 1955  Weekly Radiation Therapy Management  Current Dose: 28.8 Gy     Planned Dose:  61.2 Gy  Narrative . . . . . . . . The patient presents for routine under treatment assessment.                                                     The patient has been on break from her radiation since April 11. She continues to use Silvadene for the area of moist desquamation. She is about to run out of this medication I have refilled this as well as her Vicodin.                                                                The chart was checked. Physical Findings. . .  weight is 349 lb (158.305 kg). Her oral temperature is 98 F (36.7 C). Her blood pressure is 131/104 and her pulse is 93. Her respiration is 20 and oxygen saturation is 98%. . Weight essentially stable.  The right breast area shows hyperpigmentation changes. There is a small area of moist desquamation measuring approximately 2 cm x 1.5 cm in the inframammary fold area. the remainder of the skin is well healed at this time Impression . . . . . she is improving with a break in treatment Plan . . . . . . . . . . . . Continue treatment as planned.  She feels she will be a will to restart her radiation in one week.  ________________________________  -----------------------------------  Blair Promise, PhD, MD

## 2012-06-14 NOTE — Addendum Note (Signed)
Encounter addended by: Heywood Footman, RN on: 06/14/2012  3:50 PM<BR>     Documentation filed: Orders

## 2012-06-14 NOTE — Addendum Note (Signed)
Encounter addended by: Heywood Footman, RN on: 06/14/2012  3:52 PM<BR>     Documentation filed: Inpatient MAR

## 2012-06-14 NOTE — Progress Notes (Signed)
Patient presents to the clinic today unaccompanied for skin check. Patient alert and oriented to person, place, and time.  No distress noted. Patient being pushed in wheelchair. Flat affect noted. Patient reports right breast pain 8 on a scale of 0-10 despite taking two norco 5/325 mg tablets just two hours ago. Patient reports that she continues to use silvadene tid. Hyperpigmentation of right breast and axilla noted. Dry desquamation of right axilla and under right breast noted. Patient reports fatigue continues. Patient denies cough but, reports shortness of breath at rest and with exertion. Patient reports she would like for the dime size area of desquamation under her right breast to heal completely prior to resuming radiation therapy. Patient thinks she will be likely to resume xrt next week. Reported all findings to Dr. Sondra Come.

## 2012-06-15 ENCOUNTER — Ambulatory Visit: Payer: Medicare Other

## 2012-06-16 ENCOUNTER — Ambulatory Visit: Payer: Medicare Other

## 2012-06-17 ENCOUNTER — Ambulatory Visit: Payer: Medicare Other

## 2012-06-18 ENCOUNTER — Ambulatory Visit: Payer: Medicare Other

## 2012-06-21 ENCOUNTER — Ambulatory Visit: Payer: Medicare Other

## 2012-06-21 ENCOUNTER — Telehealth: Payer: Self-pay | Admitting: Radiation Oncology

## 2012-06-21 ENCOUNTER — Ambulatory Visit
Admission: RE | Admit: 2012-06-21 | Discharge: 2012-06-21 | Disposition: A | Discharge status: Home / Self Care | Payer: Medicare Other | Source: Ambulatory Visit | Attending: Radiation Oncology | Admitting: Radiation Oncology

## 2012-06-21 NOTE — Telephone Encounter (Signed)
Phoned patient to inquire if she intended to present for radiation treatment today following break. Patient verbalized she does plan to come in for treatment today. Informed Miranda, RT on linac 3 of this finding. Routed to Dr. Sondra Come.

## 2012-06-22 ENCOUNTER — Ambulatory Visit
Admission: RE | Admit: 2012-06-22 | Discharge: 2012-06-22 | Disposition: A | Discharge status: Home / Self Care | Payer: Medicare Other | Source: Ambulatory Visit | Attending: Radiation Oncology | Admitting: Radiation Oncology

## 2012-06-22 VITALS — BP 118/78 | HR 103 | Temp 98.3°F | Ht 66.0 in | Wt 345.5 lb

## 2012-06-22 DIAGNOSIS — C50411 Malignant neoplasm of upper-outer quadrant of right female breast: Secondary | ICD-10-CM

## 2012-06-22 MED ORDER — HYDROCODONE-ACETAMINOPHEN 5-325 MG PO TABS: 1.0000 | ORAL_TABLET | ORAL | Status: DC | PRN

## 2012-06-22 NOTE — Progress Notes (Signed)
[name] here for weekly under treat visit.  Today she presents to the clinic in a wheelchair but states she does walk.  She has had 18/25 fractions to her right breast.  She is having pain in her right breast that she is rating at a 8/10.  She is having fatigue.  She is also reporting that after she has a bowel movement she sees blood when wiping.  The skin on her right breast is reddened.  Her skin is peeling underneath the breast and underneath her right arm.  She is using silvadene cream under her breast and radiaplex gel under her arm.

## 2012-06-22 NOTE — Progress Notes (Signed)
[place]     Rexene Edison, M.D. [place], [place] [address]               Blair Promise, M.D., Ph.D. Phone: [phone]      [place] A. Tammi Klippel, M.D. Fax: [phone]      Jodelle Gross, M.D., Ph.D.         Thea Silversmith, M.D.         Wyvonnia Lora, M.D Weekly Treatment Management Note  Name: [name]     MRN: [id number]        CSN: [id number] Date: 06/22/2012      DOB: 12-24-1955  CC: Tammie Peers, MD         [name]    Status: Outpatient  Diagnosis: The encounter diagnosis was Cancer of upper-outer quadrant of female breast, right.  Current Dose: 32.4 Gy  Current Fraction: 18  Planned Dose: 61.2 Gy  Narrative: [name] was seen today for weekly treatment management. The chart was checked and port films  were reviewed. She is doing better since being on a breakthrough or treatment since 05/28/2012. She however continues to complain of pain in the right breast and fatigue.  She is about to run out of her pain medication and I have refilled this today. Patient continues to use Silvadene.  Shellfish allergy; Morphine and related; and Oxycodone hcl Current Outpatient Prescriptions  Medication Sig Dispense Refill  . albuterol (PROVENTIL HFA;VENTOLIN HFA) 108 (90 BASE) MCG/ACT inhaler Inhale 2 puffs into the lungs every 6 (six) hours as needed for wheezing or shortness of breath.       . ARIPiprazole (ABILIFY) 10 MG tablet Take 10 mg by mouth every morning.       . budesonide-formoterol (SYMBICORT) 80-4.5 MCG/ACT inhaler Inhale 1-2 puffs into the lungs daily as needed. For shortness of breath      . buPROPion (WELLBUTRIN XL) 150 MG 24 hr tablet Take 300 mg by mouth daily.      [name] esomeprazole (NEXIUM) 40 MG capsule Take 40 mg by mouth daily before breakfast.      . hyaluronate sodium (RADIAPLEXRX) GEL Apply 1 application topically 2 (two) times daily.      [name] HYDROcodone-acetaminophen (NORCO) 5-325 MG per  tablet Take 2 tablets by mouth every 4 (four) hours as needed for pain.  20 tablet  0  . HYDROcodone-acetaminophen (NORCO/VICODIN) 5-325 MG per tablet Take 1-2 tablets by mouth every 4 (four) hours as needed for pain.  50 tablet  0  . levothyroxine (SYNTHROID, LEVOTHROID) 200 MCG tablet Take 200 mcg by mouth every morning.       [name] LORazepam (ATIVAN) 1 MG tablet Take 1 mg by mouth every 8 (eight) hours as needed. For anxiety      . meloxicam (MOBIC) 15 MG tablet Take 15 mg by mouth every morning.       . montelukast (SINGULAIR) 10 MG tablet       . non-metallic deodorant (ALRA) MISC Apply 1 application topically daily as needed.      [name] PARoxetine (PAXIL) 20 MG tablet Take 30 mg by mouth every morning.      . pravastatin (PRAVACHOL) 40 MG tablet Take 80 mg by mouth at bedtime.      [name] QUEtiapine (SEROQUEL) 300 MG tablet Take 600 mg by mouth at bedtime.       . silver sulfADIAZINE (SILVADENE) 1 % cream Apply topically  daily.      . silver sulfADIAZINE (SILVADENE) 1 % cream Apply topically daily.  50 g  0  . traZODone (DESYREL) 100 MG tablet Take 100 mg by mouth at bedtime.      . valACYclovir (VALTREX) 1000 MG tablet Take 1,000 mg by mouth daily.      [name] zolpidem (AMBIEN) 10 MG tablet Take 10 mg by mouth at bedtime as needed. For sleep       No current facility-administered medications for this encounter.   Labs:  Lab Results  Component Value Date   WBC 8.0 04/13/2012   HGB 12.2 04/13/2012   HCT 36.1 04/13/2012   MCV 84.5 04/13/2012   PLT 184 04/13/2012   Lab Results  Component Value Date   CREATININE 1.0 04/13/2012   BUN 11.7 04/13/2012   NA 141 04/13/2012   K 4.0 04/13/2012   CL 104 04/13/2012   CO2 28 04/13/2012   Lab Results  Component Value Date   ALT 12 04/13/2012   AST 9 04/13/2012   BILITOT <0.20 Repeated and Verified 04/13/2012    Physical Examination:  height is _0  (1.676 m) and weight is 345 lb 8 oz (156.718 kg). Her temperature is 98.3 F (36.8 C). Her blood pressure is 118/78  and her pulse is 103.    Wt Readings from Last 3 Encounters:  06/22/12 345 lb 8 oz (156.718 kg)  06/14/12 349 lb (158.305 kg)  06/07/12 349 lb (158.305 kg)    The right breast area shows hyperpigmentation changes. There is no signs of moist desquamation at this time. Lungs - Normal respiratory effort, chest expands symmetrically. Lungs are clear to auscultation, no crackles or wheezes.  Heart has regular rhythm and rate  Abdomen is soft and non tender with normal bowel sounds  Assessment:  Patient tolerating treatments well except for issues as above  Plan: Continue treatment per original radiation prescription

## 2012-06-23 ENCOUNTER — Ambulatory Visit: Payer: Medicare Other

## 2012-06-23 ENCOUNTER — Ambulatory Visit
Admission: RE | Admit: 2012-06-23 | Discharge: 2012-06-23 | Disposition: A | Discharge status: Home / Self Care | Payer: Medicare Other | Source: Ambulatory Visit | Attending: Radiation Oncology | Admitting: Radiation Oncology

## 2012-06-24 ENCOUNTER — Ambulatory Visit
Admission: RE | Admit: 2012-06-24 | Discharge: 2012-06-24 | Disposition: A | Discharge status: Home / Self Care | Payer: Medicare Other | Source: Ambulatory Visit | Attending: Radiation Oncology | Admitting: Radiation Oncology

## 2012-06-24 ENCOUNTER — Ambulatory Visit: Payer: Medicare Other

## 2012-06-25 ENCOUNTER — Ambulatory Visit
Admission: RE | Admit: 2012-06-25 | Discharge: 2012-06-25 | Disposition: A | Discharge status: Home / Self Care | Payer: Medicare Other | Source: Ambulatory Visit | Attending: Radiation Oncology | Admitting: Radiation Oncology

## 2012-06-28 ENCOUNTER — Ambulatory Visit
Admission: RE | Admit: 2012-06-28 | Discharge: 2012-06-28 | Disposition: A | Discharge status: Home / Self Care | Payer: Medicare Other | Source: Ambulatory Visit | Attending: Radiation Oncology | Admitting: Radiation Oncology

## 2012-06-28 ENCOUNTER — Telehealth: Payer: Self-pay | Admitting: Dietician

## 2012-06-29 ENCOUNTER — Ambulatory Visit
Admission: RE | Admit: 2012-06-29 | Discharge: 2012-06-29 | Disposition: A | Discharge status: Home / Self Care | Payer: Medicare Other | Source: Ambulatory Visit | Attending: Radiation Oncology | Admitting: Radiation Oncology

## 2012-06-29 VITALS — BP 116/75 | HR 82 | Temp 98.3°F | Ht 66.0 in | Wt 350.7 lb

## 2012-06-29 DIAGNOSIS — C50411 Malignant neoplasm of upper-outer quadrant of right female breast: Secondary | ICD-10-CM

## 2012-06-29 NOTE — Progress Notes (Signed)
[facility]     Rexene Edison, M.D. [place], [place] [address]               Blair Promise, M.D., Ph.D. Phone: [phone]      [place] A. Tammi Klippel, M.D. Fax: [phone]      Jodelle Gross, M.D., Ph.D.         Thea Silversmith, M.D.         Wyvonnia Lora, M.D Weekly Treatment Management Note  Name: [name]     MRN: [id number]        CSN: [id number] Date: 06/29/2012      DOB: March 30, 1955  CC: Tammie Peers, MD         Criss Rosales    Status: Outpatient  Diagnosis: The encounter diagnosis was Cancer of upper-outer quadrant of female breast, right.  Current Dose: 41.4 Gy  Current Fraction: 23  Planned Dose: 61.2 Gy  Narrative: [name] was seen today for weekly treatment management. The chart was checked and port films  were reviewed. She continues to have a lot of discomfort in the breast controlled well with medication. She also complains of fatigue  Shellfish allergy; Morphine and related; and Oxycodone hcl  Current Outpatient Prescriptions  Medication Sig Dispense Refill  . albuterol (PROVENTIL HFA;VENTOLIN HFA) 108 (90 BASE) MCG/ACT inhaler Inhale 2 puffs into the lungs every 6 (six) hours as needed for wheezing or shortness of breath.       . ARIPiprazole (ABILIFY) 10 MG tablet Take 10 mg by mouth every morning.       . budesonide-formoterol (SYMBICORT) 80-4.5 MCG/ACT inhaler Inhale 1-2 puffs into the lungs daily as needed. For shortness of breath      . buPROPion (WELLBUTRIN XL) 150 MG 24 hr tablet Take 300 mg by mouth daily.      [name] esomeprazole (NEXIUM) 40 MG capsule Take 40 mg by mouth daily before breakfast.      . hyaluronate sodium (RADIAPLEXRX) GEL Apply 1 application topically 2 (two) times daily.      [name] HYDROcodone-acetaminophen (NORCO) 5-325 MG per tablet Take 2 tablets by mouth every 4 (four) hours as needed for pain.  20 tablet  0  . HYDROcodone-acetaminophen (NORCO/VICODIN) 5-325 MG per tablet  Take 1-2 tablets by mouth every 4 (four) hours as needed for pain.  50 tablet  0  . levothyroxine (SYNTHROID, LEVOTHROID) 200 MCG tablet Take 200 mcg by mouth every morning.       [name] LORazepam (ATIVAN) 1 MG tablet Take 1 mg by mouth every 8 (eight) hours as needed. For anxiety      . meloxicam (MOBIC) 15 MG tablet Take 15 mg by mouth every morning.       . montelukast (SINGULAIR) 10 MG tablet       . non-metallic deodorant (ALRA) MISC Apply 1 application topically daily as needed.      [name] PARoxetine (PAXIL) 20 MG tablet Take 30 mg by mouth every morning.      . pravastatin (PRAVACHOL) 40 MG tablet Take 80 mg by mouth at bedtime.      [name] QUEtiapine (SEROQUEL) 300 MG tablet Take 600 mg by mouth at bedtime.       . silver sulfADIAZINE (SILVADENE) 1 % cream Apply topically daily.      . silver sulfADIAZINE (SILVADENE) 1 % cream Apply topically daily.  50 g  0  . traZODone (DESYREL) 100 MG  tablet Take 100 mg by mouth at bedtime.      . valACYclovir (VALTREX) 1000 MG tablet Take 1,000 mg by mouth daily.      [name] zolpidem (AMBIEN) 10 MG tablet Take 10 mg by mouth at bedtime as needed. For sleep       No current facility-administered medications for this encounter.      Physical Examination:  height is 5' 6" (1.676 m) and weight is 350 lb 11.2 oz (159.076 kg). Her temperature is 98.3 F (36.8 C). Her blood pressure is 116/75 and her pulse is 82.    Wt Readings from Last 3 Encounters:  06/29/12 350 lb 11.2 oz (159.076 kg)  06/22/12 345 lb 8 oz (156.718 kg)  06/14/12 349 lb (158.305 kg)    The right breast area shows hyperpigmentation changes. There is no active moist desquamation.  There is also erythema along the axillary and inframammary fold area. Lungs - Normal respiratory effort, chest expands symmetrically. Lungs are clear to auscultation, no crackles or wheezes.  Heart has regular rhythm and rate  Abdomen is soft and non tender with normal bowel sounds  Assessment:  Patient tolerating  treatments well except for above issues  Plan: Continue treatment per original radiation prescription

## 2012-06-29 NOTE — Progress Notes (Signed)
[name] here for weekly under treat visit.  She has had 23/25 fractions to her right breast.  She does have pain in the right breast that she is rating at a 6/10.  She also states that her appetite is not good.  She also is experiencing nausea occasionally.  The skin on her right breast is red.  She does have an open are under her breast on the right.  She has been using radiaplex gel and silvadene.

## 2012-06-30 ENCOUNTER — Ambulatory Visit: Payer: Medicare Other

## 2012-06-30 ENCOUNTER — Encounter: Payer: Self-pay | Admitting: Radiation Oncology

## 2012-06-30 ENCOUNTER — Ambulatory Visit
Admission: RE | Admit: 2012-06-30 | Discharge: 2012-06-30 | Disposition: A | Discharge status: Home / Self Care | Payer: Medicare Other | Source: Ambulatory Visit | Attending: Radiation Oncology | Admitting: Radiation Oncology

## 2012-07-01 ENCOUNTER — Ambulatory Visit: Payer: Medicare Other

## 2012-07-01 ENCOUNTER — Ambulatory Visit
Admission: RE | Admit: 2012-07-01 | Discharge: 2012-07-01 | Disposition: A | Discharge status: Home / Self Care | Payer: Medicare Other | Source: Ambulatory Visit | Attending: Radiation Oncology | Admitting: Radiation Oncology

## 2012-07-01 DIAGNOSIS — C50411 Malignant neoplasm of upper-outer quadrant of right female breast: Secondary | ICD-10-CM

## 2012-07-01 NOTE — Progress Notes (Signed)
  Radiation Oncology         (336) [phone] ________________________________  Name: [name] MRN: [id number]  Date: 07/01/2012  DOB: 1956-01-18  Simulation Verification Note  Status: outpatient  NARRATIVE: The patient was brought to the treatment unit and placed in the planned treatment position. The clinical setup was verified. Then port films were obtained and uploaded to the radiation oncology medical record software.  The treatment beams were carefully compared against the planned radiation fields. The position location and shape of the radiation fields was reviewed. They targeted volume of tissue appears to be appropriately covered by the radiation beams. Organs at risk appear to be excluded as planned.  Based on my personal review, I approved the simulation verification. The patient's treatment will proceed as planned.  -----------------------------------  Blair Promise, PhD, MD

## 2012-07-02 ENCOUNTER — Ambulatory Visit: Payer: Medicare Other

## 2012-07-02 ENCOUNTER — Ambulatory Visit
Admission: RE | Admit: 2012-07-02 | Discharge: 2012-07-02 | Disposition: A | Discharge status: Home / Self Care | Payer: Medicare Other | Source: Ambulatory Visit | Attending: Radiation Oncology | Admitting: Radiation Oncology

## 2012-07-05 ENCOUNTER — Ambulatory Visit
Admission: RE | Admit: 2012-07-05 | Discharge: 2012-07-05 | Disposition: A | Discharge status: Home / Self Care | Payer: Medicare Other | Source: Ambulatory Visit | Attending: Radiation Oncology | Admitting: Radiation Oncology

## 2012-07-06 ENCOUNTER — Ambulatory Visit
Admission: RE | Admit: 2012-07-06 | Discharge: 2012-07-06 | Disposition: A | Discharge status: Home / Self Care | Payer: Medicare Other | Source: Ambulatory Visit | Attending: Radiation Oncology | Admitting: Radiation Oncology

## 2012-07-06 VITALS — BP 124/72 | HR 92 | Temp 98.0°F | Wt 346.4 lb

## 2012-07-06 DIAGNOSIS — C50411 Malignant neoplasm of upper-outer quadrant of right female breast: Secondary | ICD-10-CM

## 2012-07-06 MED ORDER — HYDROCODONE-ACETAMINOPHEN 5-325 MG PO TABS: 1.0000 | ORAL_TABLET | ORAL | Status: DC | PRN

## 2012-07-06 NOTE — Progress Notes (Signed)
  Radiation Oncology         (336) [phone] ________________________________  Name: [name] MRN: [id number]  Date: 07/06/2012  DOB: March 28, 1955  Weekly Radiation Therapy Management  Current Dose: 49.4 Gy     Planned Dose:  61.2 Gy  Narrative . . . . . . . . The patient presents for routine under treatment assessment.                                                     The patient is without complaint except for continued pain in the right breast area. I did refill her pain medication today.                                 Set-up films were reviewed.                                 The chart was checked. Physical Findings. . .  weight is 346 lb 6.4 oz (157.126 kg). Her temperature is 98 F (36.7 C). Her blood pressure is 124/72 and her pulse is 92. . Weight essentially stable.  No no moist desquamation noted in the right breast. There are hyperpigmentation changes. Impression . . . . . . . The patient is  tolerating radiation. Plan . . . . . . . . . . . . Continue treatment as planned.  ________________________________  -----------------------------------  Blair Promise, PhD, MD

## 2012-07-06 NOTE — Progress Notes (Signed)
Patient here for weekly assessment of radiation of right breast.Hyperpigmentation of skin.No peeling visualized.Pain level 8.Takes 2 hydrocodone q4h.Has started boost.

## 2012-07-07 ENCOUNTER — Ambulatory Visit
Admission: RE | Admit: 2012-07-07 | Discharge: 2012-07-07 | Disposition: A | Discharge status: Home / Self Care | Payer: Medicare Other | Source: Ambulatory Visit | Attending: Radiation Oncology | Admitting: Radiation Oncology

## 2012-07-08 ENCOUNTER — Ambulatory Visit
Admission: RE | Admit: 2012-07-08 | Discharge: 2012-07-08 | Disposition: A | Discharge status: Home / Self Care | Payer: Medicare Other | Source: Ambulatory Visit | Attending: Radiation Oncology | Admitting: Radiation Oncology

## 2012-07-09 ENCOUNTER — Ambulatory Visit
Admission: RE | Admit: 2012-07-09 | Discharge: 2012-07-09 | Disposition: A | Discharge status: Home / Self Care | Payer: Medicare Other | Source: Ambulatory Visit | Attending: Radiation Oncology | Admitting: Radiation Oncology

## 2012-07-11 IMAGING — MR MR LUMBAR SPINE W/O CM
5 of 6 series · 30 of 48 positions shown · non-contrast
Comparison: None.

CLINICAL DATA: Low back pain with radiculopathy.  Left leg pain
worse than right leg pain.  Weakness

MRI LUMBAR SPINE WITHOUT CONTRAST
TECHNIQUE: Multiplanar and multiecho pulse sequences of the lumbar
spine were obtained without intravenous contrast.

[Series 4: T1 · sagittal · 4.0mm · 0.55mm/px · 5 of 12 slices shown (1 of 2)]
[im 1/12]
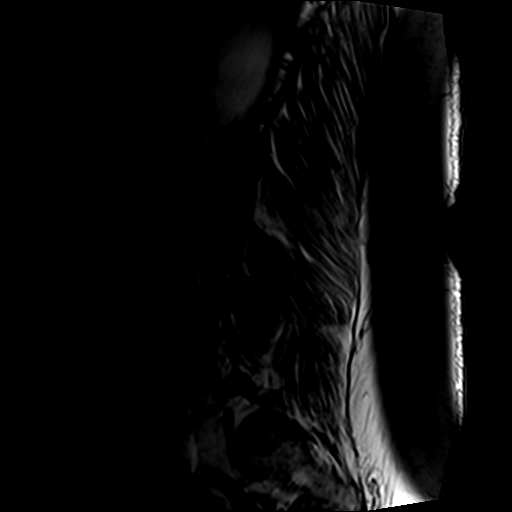
[im 3/12]
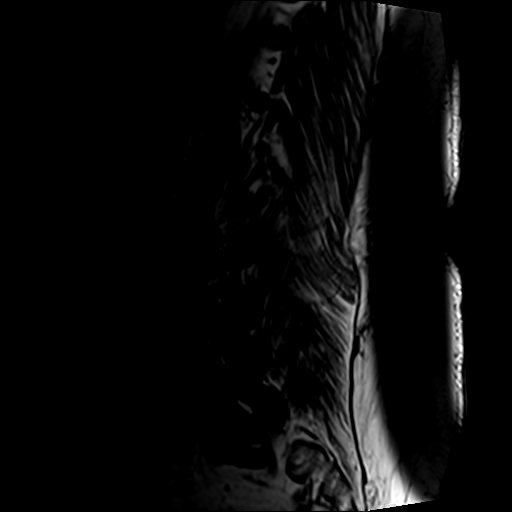
[im 6/12]
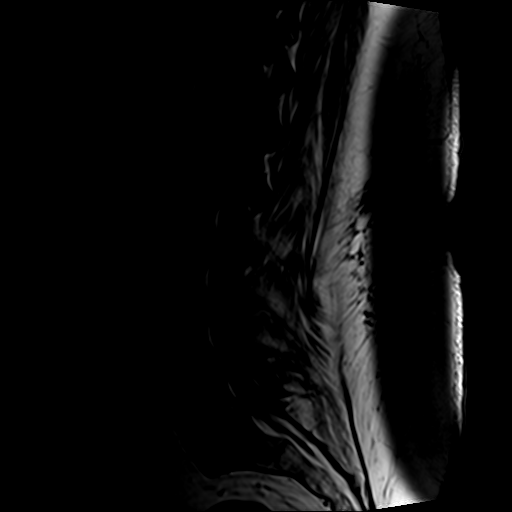
[im 9/12]
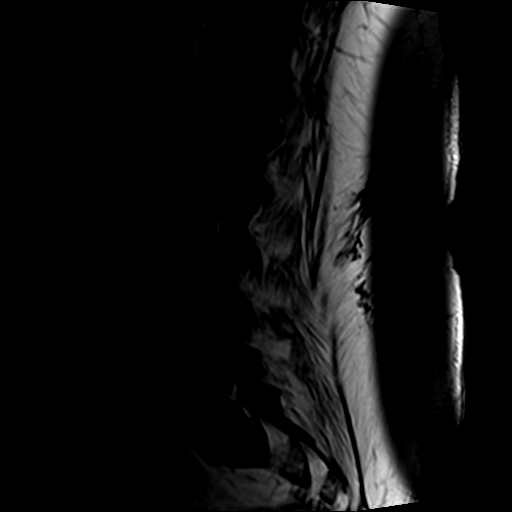
[im 12/12]
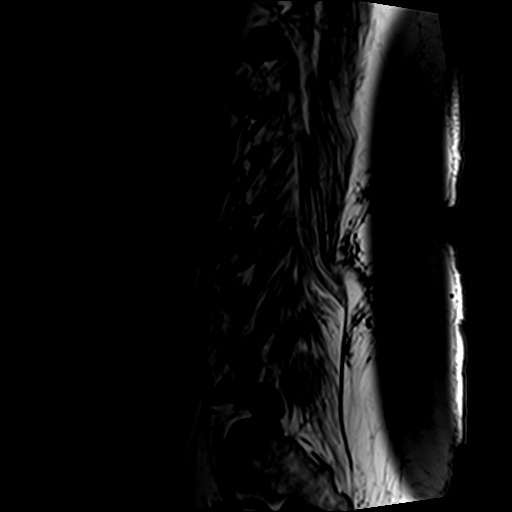

[Series 5: T2 · sagittal · 4.0mm · 0.55mm/px · 5 of 12 slices shown (1 of 2)]
[im 1/12]
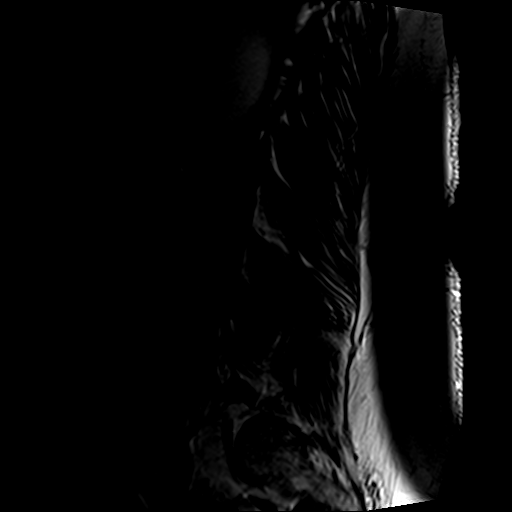
[im 3/12]
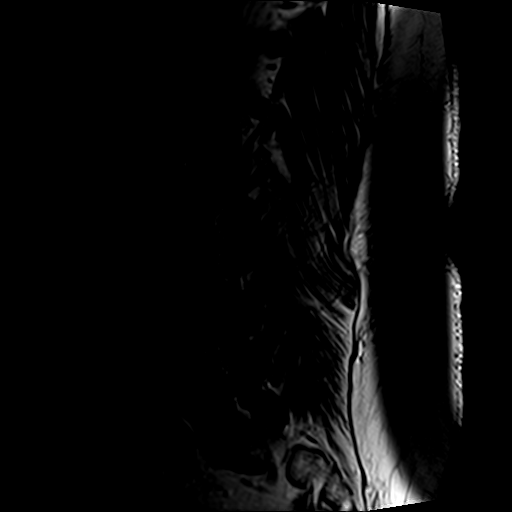
[im 6/12]
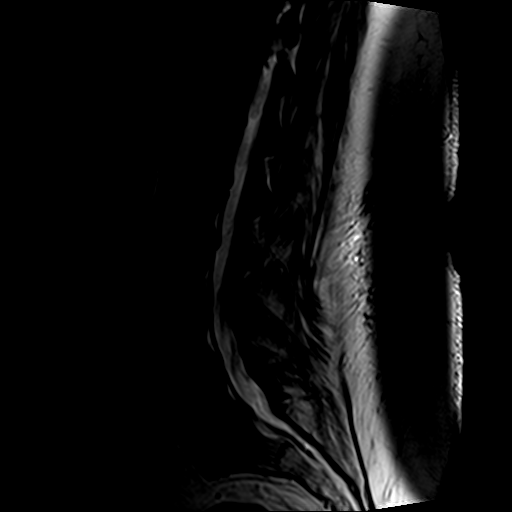
[im 9/12]
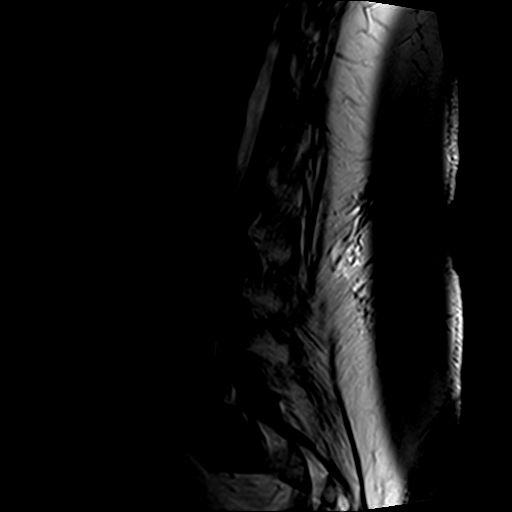
[im 12/12]
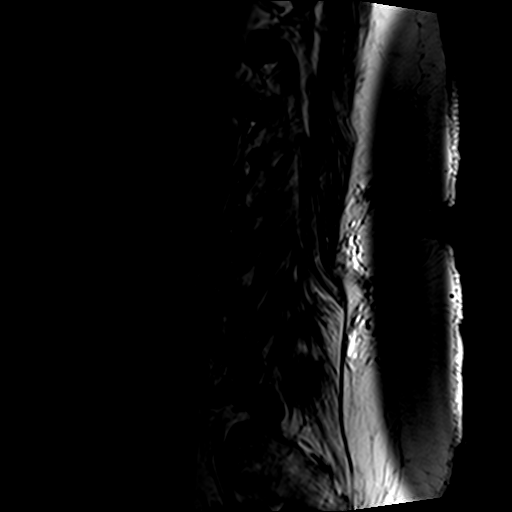

[Series 6: STIR · sagittal · 4.0mm · 0.55mm/px · 4 of 12 slices shown]
[im 1/12]
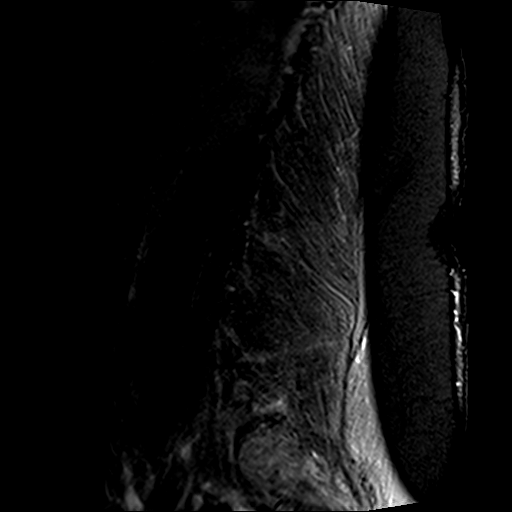
[im 3/12]
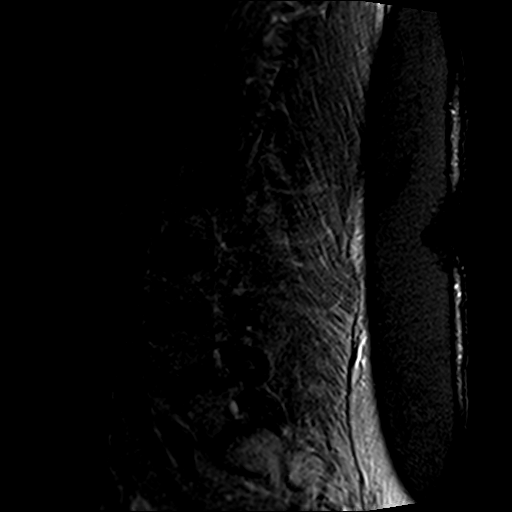
[im 6/12]
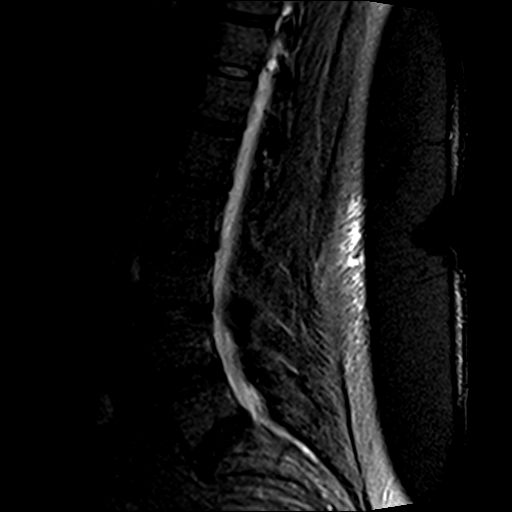
[im 9/12]
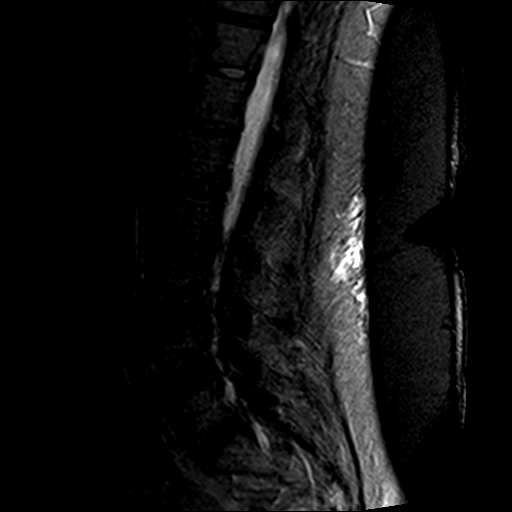

[Series 8: T2 · axial · 4.0mm · 0.89mm/px · z∈[-18,+178]mm · 8 of 34 slices shown (2 of 2)]
[im 1/34]
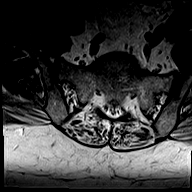
[im 6/34]
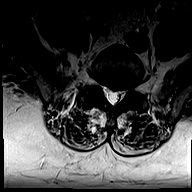
[im 11/34]
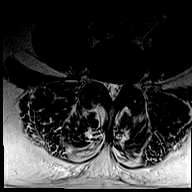
[im 16/34]
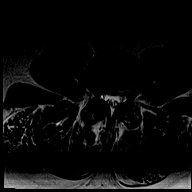
[im 18/34]
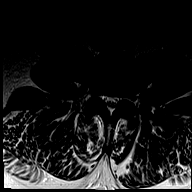
[im 23/34]
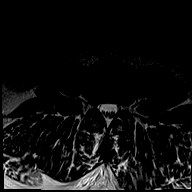
[im 28/34]
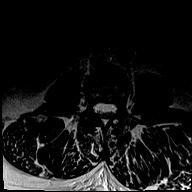
[im 34/34]
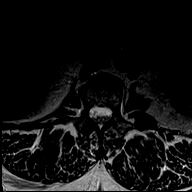

[Series 9: T1 · axial · 4.0mm · 0.44mm/px · z∈[-15,+177]mm · 8 of 34 slices shown (2 of 2)]
[im 1/34]
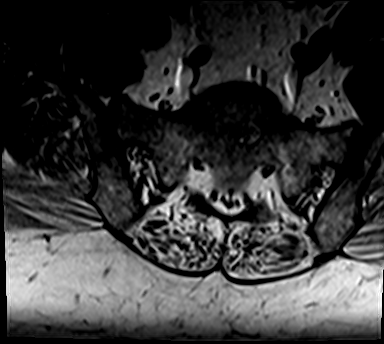
[im 6/34]
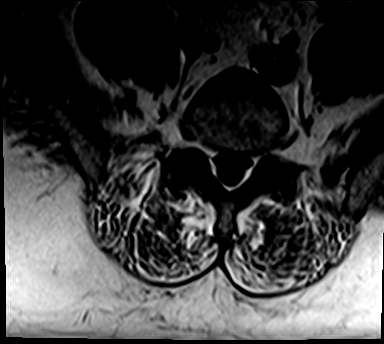
[im 11/34]
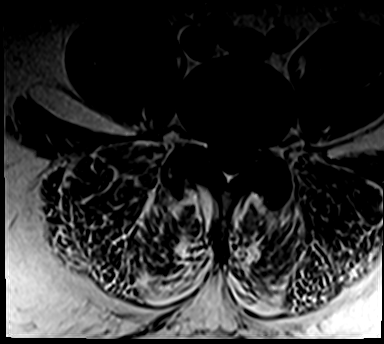
[im 16/34]
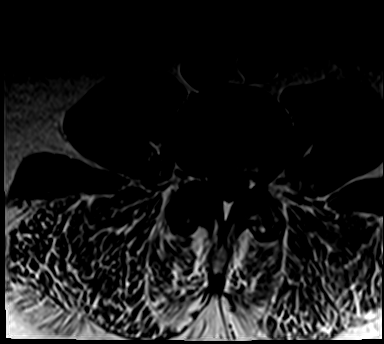
[im 18/34]
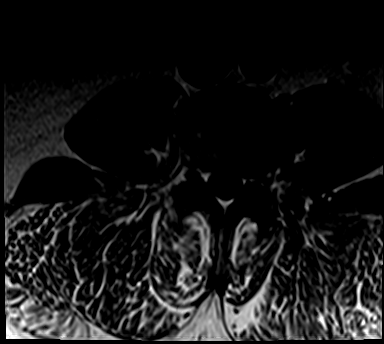
[im 23/34]
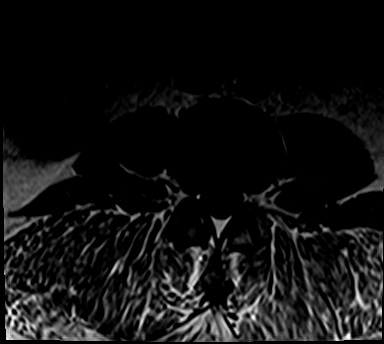
[im 28/34]
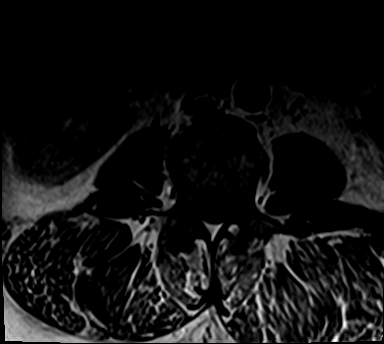
[im 34/34]
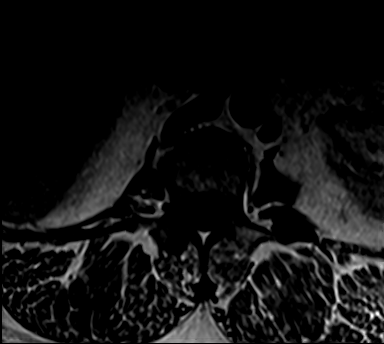

[30 of 48 positions shown; findings below may reference images not displayed]

FINDINGS: Normal lumbar alignment.  Negative for fracture or mass
lesion.  Conus medullaris is normal and terminates at L1-2.

L1-2:  Negative

L2-3:  Mild disc degeneration.

L3-4:  Mild disc degeneration.  Moderate to advanced facet
hypertrophy bilaterally.  Mild spinal stenosis.  Moderate foraminal
narrowing bilaterally.

L4-5:  Mild disc degeneration and spurring.  Advanced facet
hypertrophy bilaterally with mild spinal stenosis.  Moderate
foraminal narrowing bilaterally.

L5-S1:  Mild disc and facet degeneration without significant spinal
stenosis.
IMPRESSION: Lumbar degenerative changes at multiple levels.  No acute disc
protrusion.  There is mild spinal stenosis at L3-4 and L4-5 due to
disc and facet degeneration.  Foraminal encroachment bilaterally at
L3-4 and L4-5 is present.

## 2012-07-13 ENCOUNTER — Ambulatory Visit
Admission: RE | Admit: 2012-07-13 | Discharge: 2012-07-13 | Disposition: A | Discharge status: Home / Self Care | Payer: Medicare Other | Source: Ambulatory Visit | Attending: Radiation Oncology | Admitting: Radiation Oncology

## 2012-07-13 ENCOUNTER — Encounter: Payer: Self-pay | Admitting: Radiation Oncology

## 2012-07-13 VITALS — BP 158/85 | HR 95 | Temp 98.3°F | Resp 20 | Wt 350.8 lb

## 2012-07-13 DIAGNOSIS — C50411 Malignant neoplasm of upper-outer quadrant of right female breast: Secondary | ICD-10-CM

## 2012-07-13 MED ORDER — HYDROCODONE-ACETAMINOPHEN 5-325 MG PO TABS: 1.0000 | ORAL_TABLET | ORAL | Status: DC | PRN

## 2012-07-13 NOTE — Progress Notes (Signed)
Pt states she continues to have right breast soreness and stabbing pains. She takes Hydrocodone prn, usually between 2-3 tabs daily. She states "med is helpful". Pt reports fatigue, loss of appetite. She is applying Silvadene cream to entire breast tx area including under breast. She states her nipple is "peeling a little".

## 2012-07-13 NOTE — Progress Notes (Signed)
  Radiation Oncology         [phone] ________________________________  Name: [name] MRN: [id number]  Date: 07/13/2012  DOB: 12/18/1955  Weekly Radiation Therapy Management  Current Dose: 57.6 Gy     Planned Dose:  61.2 Gy  Narrative . . . . . . . . The patient presents for routine under treatment assessment.                                                     The patient is without complaint except for continued pain in the right breast area. She denies any chills or fever.                                 Set-up films were reviewed.                                 The chart was checked. Physical Findings. . .  weight is 350 lb 12.8 oz (159.122 kg). Her oral temperature is 98.3 F (36.8 C). Her blood pressure is 158/85 and her pulse is 95. Her respiration is 20. . Weight essentially stable. Hyperpigmentation changes and erythema in the treatment area but no moist desquamation. Impression . . . . . . . The patient is  tolerating radiation. Plan . . . . . . . . . . . . Continue treatment as planned.  ________________________________  -----------------------------------  Blair Promise, PhD, MD

## 2012-07-14 ENCOUNTER — Ambulatory Visit
Admission: RE | Admit: 2012-07-14 | Discharge: 2012-07-14 | Disposition: A | Discharge status: Home / Self Care | Payer: Medicare Other | Source: Ambulatory Visit | Attending: Radiation Oncology | Admitting: Radiation Oncology

## 2012-07-15 ENCOUNTER — Ambulatory Visit
Admission: RE | Admit: 2012-07-15 | Discharge: 2012-07-15 | Disposition: A | Discharge status: Home / Self Care | Payer: Medicare Other | Source: Ambulatory Visit | Attending: Radiation Oncology | Admitting: Radiation Oncology

## 2012-07-15 ENCOUNTER — Ambulatory Visit: Payer: Medicare Other

## 2012-07-16 ENCOUNTER — Encounter (INDEPENDENT_AMBULATORY_CARE_PROVIDER_SITE_OTHER): Payer: Self-pay | Admitting: Surgery

## 2012-07-30 ENCOUNTER — Other Ambulatory Visit: Payer: Self-pay | Admitting: Radiation Oncology

## 2012-07-30 ENCOUNTER — Telehealth: Payer: Self-pay

## 2012-07-30 DIAGNOSIS — C50411 Malignant neoplasm of upper-outer quadrant of right female breast: Secondary | ICD-10-CM

## 2012-07-30 MED ORDER — HYDROCODONE-ACETAMINOPHEN 5-325 MG PO TABS: 1.0000 | ORAL_TABLET | ORAL | Status: DC | PRN

## 2012-07-30 NOTE — Telephone Encounter (Signed)
Prescription for hydrocodone 5-325 called into [facility] on [address]. [phone].Patient informed .

## 2012-08-02 ENCOUNTER — Other Ambulatory Visit: Payer: Self-pay | Admitting: *Deleted

## 2012-08-02 ENCOUNTER — Encounter (INDEPENDENT_AMBULATORY_CARE_PROVIDER_SITE_OTHER): Payer: Self-pay | Admitting: Surgery

## 2012-08-02 ENCOUNTER — Encounter: Payer: Self-pay | Admitting: Radiation Oncology

## 2012-08-02 DIAGNOSIS — Z923 Personal history of irradiation: Secondary | ICD-10-CM | POA: Insufficient documentation

## 2012-08-02 NOTE — Progress Notes (Signed)
  Radiation Oncology         (336) [phone] ________________________________  Name: [name] MRN: [id number]  Date: 08/02/2012  DOB: [birth year]  End of Treatment Note  Diagnosis:   Intraductal carcinoma of the right breast     Indication for treatment:  Breast conservation therapy       Radiation treatment dates:   05/05/2012 through 07/15/2012  Site/dose:   Right breast 45 gray in 25 fractions, the lumpectomy cavity area was boosted 16.2 gray, total dose to the lumpectomy cavity was 61.2 gray  Beams/energy:   Tangential beams encompass in the right breast, a 3 field photon boost for the lumpectomy cavity treatment  Narrative: The patient did experience pain in the breast early during the course of treatment requiring narcotic medication. She did experience moist desquamation in the treatment area. She did require up a break in her treatment in light of her symptoms.  Plan: The patient has completed radiation treatment. The patient will return to radiation oncology clinic for routine followup in one month. I advised them to call or return sooner if they have any questions or concerns related to their recovery or treatment.  -----------------------------------  Blair Promise, PhD, MD

## 2012-08-03 ENCOUNTER — Emergency Department (HOSPITAL_COMMUNITY)
Admission: EM | Admit: 2012-08-03 | Discharge: 2012-08-03 | Disposition: A | Discharge status: Home / Self Care | Payer: Medicare Other | Attending: Emergency Medicine | Admitting: Emergency Medicine

## 2012-08-03 ENCOUNTER — Telehealth: Payer: Self-pay | Admitting: *Deleted

## 2012-08-03 ENCOUNTER — Emergency Department (HOSPITAL_COMMUNITY): Payer: Medicare Other

## 2012-08-03 ENCOUNTER — Encounter (HOSPITAL_COMMUNITY): Payer: Self-pay | Admitting: *Deleted

## 2012-08-03 DIAGNOSIS — Z8639 Personal history of other endocrine, nutritional and metabolic disease: Secondary | ICD-10-CM | POA: Insufficient documentation

## 2012-08-03 DIAGNOSIS — I10 Essential (primary) hypertension: Secondary | ICD-10-CM | POA: Insufficient documentation

## 2012-08-03 DIAGNOSIS — K644 Residual hemorrhoidal skin tags: Secondary | ICD-10-CM | POA: Insufficient documentation

## 2012-08-03 DIAGNOSIS — Z8669 Personal history of other diseases of the nervous system and sense organs: Secondary | ICD-10-CM | POA: Insufficient documentation

## 2012-08-03 DIAGNOSIS — R5381 Other malaise: Secondary | ICD-10-CM | POA: Insufficient documentation

## 2012-08-03 DIAGNOSIS — Z853 Personal history of malignant neoplasm of breast: Secondary | ICD-10-CM | POA: Insufficient documentation

## 2012-08-03 DIAGNOSIS — R0989 Other specified symptoms and signs involving the circulatory and respiratory systems: Secondary | ICD-10-CM | POA: Insufficient documentation

## 2012-08-03 DIAGNOSIS — F411 Generalized anxiety disorder: Secondary | ICD-10-CM | POA: Insufficient documentation

## 2012-08-03 DIAGNOSIS — E669 Obesity, unspecified: Secondary | ICD-10-CM | POA: Insufficient documentation

## 2012-08-03 DIAGNOSIS — R42 Dizziness and giddiness: Secondary | ICD-10-CM | POA: Insufficient documentation

## 2012-08-03 DIAGNOSIS — R079 Chest pain, unspecified: Secondary | ICD-10-CM | POA: Insufficient documentation

## 2012-08-03 DIAGNOSIS — Z862 Personal history of diseases of the blood and blood-forming organs and certain disorders involving the immune mechanism: Secondary | ICD-10-CM | POA: Insufficient documentation

## 2012-08-03 DIAGNOSIS — K219 Gastro-esophageal reflux disease without esophagitis: Secondary | ICD-10-CM | POA: Insufficient documentation

## 2012-08-03 DIAGNOSIS — Z79899 Other long term (current) drug therapy: Secondary | ICD-10-CM | POA: Insufficient documentation

## 2012-08-03 DIAGNOSIS — Z8742 Personal history of other diseases of the female genital tract: Secondary | ICD-10-CM | POA: Insufficient documentation

## 2012-08-03 DIAGNOSIS — Z923 Personal history of irradiation: Secondary | ICD-10-CM | POA: Insufficient documentation

## 2012-08-03 DIAGNOSIS — R51 Headache: Secondary | ICD-10-CM | POA: Insufficient documentation

## 2012-08-03 DIAGNOSIS — R0609 Other forms of dyspnea: Secondary | ICD-10-CM | POA: Insufficient documentation

## 2012-08-03 DIAGNOSIS — Z87442 Personal history of urinary calculi: Secondary | ICD-10-CM | POA: Insufficient documentation

## 2012-08-03 DIAGNOSIS — K625 Hemorrhage of anus and rectum: Secondary | ICD-10-CM | POA: Insufficient documentation

## 2012-08-03 DIAGNOSIS — F3289 Other specified depressive episodes: Secondary | ICD-10-CM | POA: Insufficient documentation

## 2012-08-03 DIAGNOSIS — Z8719 Personal history of other diseases of the digestive system: Secondary | ICD-10-CM | POA: Insufficient documentation

## 2012-08-03 DIAGNOSIS — Z87891 Personal history of nicotine dependence: Secondary | ICD-10-CM | POA: Insufficient documentation

## 2012-08-03 DIAGNOSIS — E079 Disorder of thyroid, unspecified: Secondary | ICD-10-CM | POA: Insufficient documentation

## 2012-08-03 DIAGNOSIS — Z8619 Personal history of other infectious and parasitic diseases: Secondary | ICD-10-CM | POA: Insufficient documentation

## 2012-08-03 DIAGNOSIS — Z8739 Personal history of other diseases of the musculoskeletal system and connective tissue: Secondary | ICD-10-CM | POA: Insufficient documentation

## 2012-08-03 DIAGNOSIS — J45901 Unspecified asthma with (acute) exacerbation: Secondary | ICD-10-CM | POA: Insufficient documentation

## 2012-08-03 DIAGNOSIS — F329 Major depressive disorder, single episode, unspecified: Secondary | ICD-10-CM | POA: Insufficient documentation

## 2012-08-03 DIAGNOSIS — R000 Tachycardia, unspecified: Secondary | ICD-10-CM | POA: Insufficient documentation

## 2012-08-03 LAB — COMPREHENSIVE METABOLIC PANEL
BUN: 11 mg/dL (ref 6–23)
Calcium: 9.2 mg/dL (ref 8.4–10.5)
Creatinine, Ser: 0.78 mg/dL (ref 0.50–1.10)
GFR calc non Af Amer: >90 mL/min (ref >90)
Glucose, Bld: 253 mg/dL — ABNORMAL HIGH (ref 70–99)
Total Bilirubin: 0.2 mg/dL — ABNORMAL LOW (ref 0.3–1.2)
Total Protein: 6.5 g/dL (ref 6.0–8.3)

## 2012-08-03 LAB — CBC
MCH: 27.7 pg (ref 26.0–34.0)
MCHC: 32.6 g/dL (ref 30.0–36.0)
MCV: 85 fL (ref 78.0–100.0)
Platelets: 184 10*3/uL (ref 150–400)
RBC: 4.01 MIL/uL (ref 3.87–5.11)
RDW: 15 % (ref 11.5–15.5)

## 2012-08-03 LAB — PROTIME-INR: INR: 0.96 (ref 0.00–1.49)

## 2012-08-03 LAB — OCCULT BLOOD, POC DEVICE: Fecal Occult Bld: POSITIVE — AB

## 2012-08-03 MED ORDER — SODIUM CHLORIDE 0.9 % IV SOLN
80.0000 mg | Freq: Once | INTRAVENOUS | Status: AC
  Administered 2012-08-03: 80 mg via INTRAVENOUS
  Filled 2012-08-03: qty 80

## 2012-08-03 MED ORDER — SODIUM CHLORIDE 0.9 % IV SOLN
1000.0000 mL | Freq: Once | INTRAVENOUS | Status: AC
  Administered 2012-08-03: 1000 mL via INTRAVENOUS

## 2012-08-03 MED ORDER — SODIUM CHLORIDE 0.9 % IV SOLN
8.0000 mg/h | INTRAVENOUS | Status: DC
  Administered 2012-08-03: 8 mg/h via INTRAVENOUS
  Filled 2012-08-03 (×2): qty 80

## 2012-08-03 MED ORDER — SODIUM CHLORIDE 0.9 % IV SOLN: 1000.0000 mL | INTRAVENOUS | Status: DC

## 2012-08-03 NOTE — ED Notes (Signed)
CG4 LACTIC ACID RESULTS GIVEN TO EDP LOCKWOOD

## 2012-08-03 NOTE — ED Notes (Signed)
Per pt report: pt c/o of "spotting" in march in her rectum area.  In April, pt reports it started bleeding.  Pt reports the bleeding was dx by PCP as hemorrhoids.  Yesterday, pt reports bleeding started to become worse likening to a menstrual bleed and sometimes heavier to that.  Pt reports she has been feeling dizzy and weak.  Pt hx of breast cancer in which she has been receiving radiation.  Pt a/o x 4 and able to ambulate from wheelchair to bed with mild assistance.  Skin warm and dry.

## 2012-08-03 NOTE — Telephone Encounter (Signed)
sw pt gv appt d/t for 08/13/12 @ 3:15pm. Pt is aware...td

## 2012-08-03 NOTE — ED Provider Notes (Signed)
History     CSN: [id number]  Arrival date & time 08/03/12  1939   First MD Initiated Contact with Patient 08/03/12 1940      Chief Complaint  Patient presents with  . Rectal Bleeding  . Dizziness     HPI  Patient presents with concern of increased rectal bleeding.  She states that over the past months she has had intermittent bleeding, but over the past weeks, and particularly today, bleeding has been more pronounced.  Typically bleeding occurs with defecation, but today there has been bleeding associated with rest, with no defecation or flatness. There is no new associated abdominal pain. There is a most sense of weakness, dizziness, without syncope, confusion, disorientation, chest pain, dyspnea. She has a notable history of breast cancer for which she is received surgical resection and radiation therapy. She states that these are well tolerated, with no known complications.   Past Medical History  Diagnosis Date  . Depression   . Thyroid disease   . GERD (gastroesophageal reflux disease)   . Pancreatitis     hx of  . Knee pain, bilateral   . Obesity   . HSV (herpes simplex virus) infection   . Hyperlipemia   . Asthma   . Tachycardia     with sx in 2008  . Breast mass in female     L breast 2008, US showed likely fat necrosis  . Sleep apnea   . Arthritis   . Anemia   . Anxiety   . Headache(784.0)   . Heartburn   . Hypertension     sees Dr. Criss Rosales , [facility]  . Kidney stones     hx of  . 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  [name] Hx of radiation therapy 05/05/12- 07/15/12    right breast, 45 gray x 25 fx, lumpectomy cavity boosted to 16.2 gray    Past Surgical History  Procedure Laterality Date  . Replacement total knee bilateral    . Foot surgery    . Abdominal hysterectomy    . Abdominal surgery    . Cholecystectomy    . Breast surgery    . Joint replacement    . Thyroid surgery    . Breast  lumpectomy  03/03/2012    Procedure: LUMPECTOMY;  Surgeon: Haywood Lasso, MD;  Location: [facility] OR;  Service: General;  Laterality: Right;    Family History  Problem Relation Age of Onset  . Heart disease Brother   . Diabetes Brother   . Hypertension Mother   . Diabetes Mother   . Breast cancer Mother 39  . Bone cancer Mother   . Hypertension Sister   . Diabetes Sister   . Breast cancer Sister 1  . Breast cancer Maternal Grandmother   . Heart disease Maternal Grandmother   . Uterine cancer Other 19  . Breast cancer Paternal Aunt 38  . Breast cancer Paternal Grandmother     dx in her 42s  . Prostate cancer Paternal Grandfather     History  Substance Use Topics  . Smoking status: Former Smoker -- 1.00 packs/day for 7 years    Types: Cigarettes    Quit date: 03/24/1992  . Smokeless tobacco: Never Used  . Alcohol Use: No    OB History   Grav Para Term Preterm Abortions TAB SAB Ect Mult Living  Review of Systems  Constitutional:       Per HPI, otherwise negative  HENT:       Per HPI, otherwise negative  Respiratory:       Per HPI, otherwise negative  Cardiovascular:       Per HPI, otherwise negative  Gastrointestinal: Negative for vomiting.  Endocrine:       Negative aside from HPI  Genitourinary:       Neg aside from HPI   Musculoskeletal:       Per HPI, otherwise negative  Skin: Negative.   Neurological: Negative for syncope.    Allergies  Shellfish allergy; Bee venom; Morphine and related; and Oxycodone hcl  Home Medications   Current Outpatient Rx  Name  Route  Sig  Dispense  Refill  . ARIPiprazole (ABILIFY) 10 MG tablet   Oral   Take 10 mg by mouth every morning.          . budesonide-formoterol (SYMBICORT) 80-4.5 MCG/ACT inhaler   Inhalation   Inhale 1-2 puffs into the lungs daily as needed. For shortness of breath         . esomeprazole (NEXIUM) 40 MG capsule   Oral   Take 40 mg by mouth daily before breakfast.          . hyaluronate sodium (RADIAPLEXRX) GEL   Topical   Apply 1 application topically 2 (two) times daily.         [name] HYDROcodone-acetaminophen (NORCO/VICODIN) 5-325 MG per tablet   Oral   Take 1-2 tablets by mouth every 4 (four) hours as needed for pain.   90 tablet   2   . levothyroxine (SYNTHROID, LEVOTHROID) 200 MCG tablet   Oral   Take 200 mcg by mouth every morning.          [name] LORazepam (ATIVAN) 1 MG tablet   Oral   Take 1 mg by mouth every 8 (eight) hours as needed. For anxiety         . meloxicam (MOBIC) 15 MG tablet   Oral   Take 15 mg by mouth every morning.          . non-metallic deodorant Jethro Poling) MISC   Topical   Apply 1 application topically daily as needed.         [name] PARoxetine (PAXIL) 20 MG tablet   Oral   Take 30 mg by mouth every morning.         . pravastatin (PRAVACHOL) 40 MG tablet   Oral   Take 80 mg by mouth at bedtime.         . silver sulfADIAZINE (SILVADENE) 1 % cream   Topical   Apply topically daily.   50 g   0   . traZODone (DESYREL) 100 MG tablet   Oral   Take 100 mg by mouth at bedtime.         . valACYclovir (VALTREX) 1000 MG tablet   Oral   Take 1,000 mg by mouth daily.         [name] zolpidem (AMBIEN) 10 MG tablet   Oral   Take 10 mg by mouth at bedtime as needed. For sleep           BP 121/103  Pulse 96  Temp(Src) 98.4 F (36.9 C) (Oral)  Resp 12  Ht _0  (1.676 m)  Wt 345 lb (156.491 kg)  BMI 55.71 kg/m2  SpO2 96%  Physical Exam  Nursing note and vitals reviewed. Constitutional: She is  oriented to person, place, and time. She appears well-developed and well-nourished. No distress.  HENT:  Head: Normocephalic and atraumatic.  Eyes: Conjunctivae and EOM are normal.  Cardiovascular: Regular rhythm.  Tachycardia present.   Pulmonary/Chest: Effort normal and breath sounds normal. No stridor. No respiratory distress.  Mild erythema about the right anterior chest wall, no drainage  Abdominal: She  exhibits no distension.  Genitourinary: Rectal exam shows external hemorrhoid. Rectal exam shows no fissure. Guaiac positive stool.  Stool is grossly negative, but Hemoccult positive.  Musculoskeletal: She exhibits no edema.  Neurological: She is alert and oriented to person, place, and time. No cranial nerve deficit.  Skin: Skin is warm and dry.  Psychiatric: She has a normal mood and affect.    ED Course  Procedures (including critical care time)  Labs Reviewed  COMPREHENSIVE METABOLIC PANEL - Abnormal; Notable for the following:    Glucose, Bld 253 (*)    Albumin 3.4 (*)    Total Bilirubin 0.2 (*)    All other components within normal limits  CBC - Abnormal; Notable for the following:    Hemoglobin 11.1 (*)    HCT 34.1 (*)    All other components within normal limits  OCCULT BLOOD, POC DEVICE - Abnormal; Notable for the following:    Fecal Occult Bld POSITIVE (*)    All other components within normal limits  CG4 I-STAT (LACTIC ACID) - Abnormal; Notable for the following:    Lactic Acid, Venous 2.74 (*)    All other components within normal limits  PROTIME-INR   Dg Chest 2 View  08/03/2012   *RADIOLOGY REPORT*  Clinical Data: GI bleed and fatigue.  CHEST - 2 VIEW  Comparison: 06/03/2011  Findings: Two views of the chest demonstrate clear lungs. Heart size is upper limits of normal but unchanged.  Surgical clips in the upper chest.  No definite pleural effusions.  IMPRESSION: No acute chest findings.   Original Report Authenticated By: Markus Daft, M.D.     No diagnosis found.  Cardiac 110 sinus tach abnormal Pulse oximetry 100 percent room air normal  Update: On repeat appears comfortable.  Symptomatically she is stable.  We discussed all results, including her hemoglobin that is appropriate, her normal vital signs.  Given that she is hemodynamically stable, though with evidence of rectal bleeding, she elected to followup with a gastroenterologist as an outpatient.  MDM   This patient with months of rectal bleeding now presents with worsening bleed.  On exam she is awake alert, hemodynamic stable.  She has external hemorrhoids, and with evidence of occult bleeding requires additional evaluation by gastroenterologist.  After discussion on the merits of doing this as an outpatient or be admitted, the patient elected for outpatient management.  Given the absence of distress, abnormal vital signs, this is reasonable.  She was provided explicit return precautions, discharged to follow up with gastroenterology.        Carmin Muskrat, MD 08/03/12 2311

## 2012-08-04 ENCOUNTER — Other Ambulatory Visit: Payer: Self-pay | Admitting: Physician Assistant

## 2012-08-04 ENCOUNTER — Telehealth: Payer: Self-pay | Admitting: *Deleted

## 2012-08-04 NOTE — Telephone Encounter (Signed)
Lm informing the pt that we move her appt on 08/13/12_0 :15pm to the am on the same day @ 9:15am. i also made the pt aware that i will mail a letter/cal as well...td

## 2012-08-09 ENCOUNTER — Encounter: Payer: Self-pay | Admitting: Radiation Oncology

## 2012-08-09 ENCOUNTER — Ambulatory Visit
Admission: RE | Admit: 2012-08-09 | Discharge: 2012-08-09 | Disposition: A | Discharge status: Home / Self Care | Payer: Medicare Other | Source: Ambulatory Visit | Attending: Radiation Oncology | Admitting: Radiation Oncology

## 2012-08-09 VITALS — BP 122/82 | HR 103 | Temp 98.1°F | Resp 20 | Wt 339.7 lb

## 2012-08-09 DIAGNOSIS — C50411 Malignant neoplasm of upper-outer quadrant of right female breast: Secondary | ICD-10-CM

## 2012-08-09 NOTE — Progress Notes (Signed)
Pt c/o pain in right breast, "soreness, occasional shooting pains". She states she is "still peeling under her breast". She states she continues to apply Silvadene on and under her right breast, Radiaplex in right axilla. Pt c/o loss of appetite, fatigue.

## 2012-08-09 NOTE — Progress Notes (Signed)
Radiation Oncology         [phone] ________________________________  Name: [name] MRN: [id number]  Date: 08/09/2012  DOB: [birth year]  Follow-Up Visit Note  CC: Elyn Peers, MD  Streck, Autumn Patty, MD  Diagnosis:   Intraductal carcinoma the right breast  Interval Since Last Radiation:  4  weeks  Narrative:  The patient returns today for routine follow-up.  She continues to have some fatigue as well as some soreness within the breast. She also has noticed some mild peeling underneath the breast. Overall her symptoms have improved. She is only taking pain medication once or twice a day                              ALLERGIES:  is allergic to shellfish allergy; bee venom; morphine and related; and oxycodone hcl.  Meds: Current Outpatient Prescriptions  Medication Sig Dispense Refill  . ARIPiprazole (ABILIFY) 10 MG tablet Take 10 mg by mouth every morning.       . budesonide-formoterol (SYMBICORT) 80-4.5 MCG/ACT inhaler Inhale 1-2 puffs into the lungs daily as needed. For shortness of breath      . esomeprazole (NEXIUM) 40 MG capsule Take 40 mg by mouth daily before breakfast.      . fluticasone (CUTIVATE) 0.05 % cream       . hyaluronate sodium (RADIAPLEXRX) GEL Apply 1 application topically 2 (two) times daily.      [name] HYDROcodone-acetaminophen (NORCO/VICODIN) 5-325 MG per tablet Take 1-2 tablets by mouth every 4 (four) hours as needed for pain.  90 tablet  2  . levothyroxine (SYNTHROID, LEVOTHROID) 200 MCG tablet Take 200 mcg by mouth every morning.       [name] LORazepam (ATIVAN) 1 MG tablet Take 1 mg by mouth every 8 (eight) hours as needed. For anxiety      . meloxicam (MOBIC) 15 MG tablet Take 15 mg by mouth every morning.       . non-metallic deodorant Jethro Poling) MISC Apply 1 application topically daily as needed.      [name] PARoxetine (PAXIL) 20 MG tablet Take 30 mg by mouth every morning.      . pravastatin (PRAVACHOL) 40 MG tablet Take 80 mg by mouth at bedtime.      .  silver sulfADIAZINE (SILVADENE) 1 % cream Apply topically daily.  50 g  0  . traZODone (DESYREL) 100 MG tablet Take 100 mg by mouth at bedtime.      . valACYclovir (VALTREX) 1000 MG tablet Take 1,000 mg by mouth daily.      . VOLTAREN 1 % GEL       . zolpidem (AMBIEN) 10 MG tablet Take 10 mg by mouth at bedtime as needed. For sleep       No current facility-administered medications for this encounter.    Physical Findings: The patient is in no acute distress. Patient is alert and oriented.  weight is 339 lb 11.2 oz (154.087 kg). Her oral temperature is 98.1 F (36.7 C). Her blood pressure is 122/82 and her pulse is 103. Her respiration is 20. .  The lungs are clear. The heart has a regular rhythm and rate. Examination right breast reveals some hyperpigmentation changes and dry desquamation but no moist desquamation. There is no dominant mass appreciated in the breast, nipple discharge or bleeding.   Radiographic Findings: Dg Chest 2 View  08/03/2012   *RADIOLOGY REPORT*  Clinical Data: GI bleed and  fatigue.  CHEST - 2 VIEW  Comparison: 06/03/2011  Findings: Two views of the chest demonstrate clear lungs. Heart size is upper limits of normal but unchanged.  Surgical clips in the upper chest.  No definite pleural effusions.  IMPRESSION: No acute chest findings.   Original Report Authenticated By: Markus Daft, M.D.    Impression:  The patient is recovering from the effects of radiation.  No evidence of recurrence on clinical exam today  Plan:  Routine followup in 6 months. Patient will be seen by medical oncology in the near future for consideration for adjuvant hormonal therapy.  _____________________________________  -----------------------------------  Blair Promise, PhD, MD

## 2012-08-13 ENCOUNTER — Encounter: Payer: Self-pay | Admitting: Physician Assistant

## 2012-08-13 ENCOUNTER — Other Ambulatory Visit: Payer: Medicare Other | Admitting: Lab

## 2012-08-13 ENCOUNTER — Ambulatory Visit: Payer: Medicare Other | Admitting: Physician Assistant

## 2012-08-13 ENCOUNTER — Ambulatory Visit (HOSPITAL_BASED_OUTPATIENT_CLINIC_OR_DEPARTMENT_OTHER): Payer: Medicare Other

## 2012-08-13 ENCOUNTER — Ambulatory Visit (HOSPITAL_BASED_OUTPATIENT_CLINIC_OR_DEPARTMENT_OTHER): Payer: Medicare Other | Admitting: Physician Assistant

## 2012-08-13 ENCOUNTER — Telehealth: Payer: Self-pay | Admitting: *Deleted

## 2012-08-13 ENCOUNTER — Telehealth: Payer: Self-pay | Admitting: Oncology

## 2012-08-13 VITALS — BP 150/78 | HR 109 | Temp 98.7°F | Resp 20 | Ht 66.0 in | Wt 348.7 lb

## 2012-08-13 DIAGNOSIS — K5900 Constipation, unspecified: Secondary | ICD-10-CM

## 2012-08-13 DIAGNOSIS — C50419 Malignant neoplasm of upper-outer quadrant of unspecified female breast: Secondary | ICD-10-CM

## 2012-08-13 DIAGNOSIS — C50411 Malignant neoplasm of upper-outer quadrant of right female breast: Secondary | ICD-10-CM

## 2012-08-13 DIAGNOSIS — D649 Anemia, unspecified: Secondary | ICD-10-CM

## 2012-08-13 DIAGNOSIS — I10 Essential (primary) hypertension: Secondary | ICD-10-CM

## 2012-08-13 DIAGNOSIS — Z780 Asymptomatic menopausal state: Secondary | ICD-10-CM

## 2012-08-13 LAB — CBC WITH DIFFERENTIAL/PLATELET
Basophils Absolute: 0 10*3/uL (ref 0.0–0.1)
EOS%: 3 % (ref 0.0–7.0)
HCT: 36.8 % (ref 34.8–46.6)
HGB: 11.7 g/dL (ref 11.6–15.9)
LYMPH%: 23.7 % (ref 14.0–49.7)
MCH: 27.3 pg (ref 25.1–34.0)
NEUT%: 68.7 % (ref 38.4–76.8)
Platelets: 200 10*3/uL (ref 145–400)
lymph#: 1.6 10*3/uL (ref 0.9–3.3)

## 2012-08-13 MED ORDER — ANASTROZOLE 1 MG PO TABS: 1.0000 mg | ORAL_TABLET | Freq: Every day | ORAL | Status: DC

## 2012-08-13 NOTE — Telephone Encounter (Signed)
[name] Kitchen

## 2012-08-13 NOTE — Telephone Encounter (Signed)
Pt aware of her lab add on appt...td
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
## 2012-08-19 ENCOUNTER — Encounter (INDEPENDENT_AMBULATORY_CARE_PROVIDER_SITE_OTHER): Payer: Self-pay | Admitting: Surgery

## 2012-08-19 ENCOUNTER — Ambulatory Visit (INDEPENDENT_AMBULATORY_CARE_PROVIDER_SITE_OTHER): Payer: Medicare Other | Admitting: Surgery

## 2012-08-19 VITALS — BP 160/100 | HR 106 | Temp 98.1°F | Resp 18 | Ht 66.0 in | Wt 343.0 lb

## 2012-08-19 DIAGNOSIS — C50411 Malignant neoplasm of upper-outer quadrant of right female breast: Secondary | ICD-10-CM

## 2012-08-19 DIAGNOSIS — C50419 Malignant neoplasm of upper-outer quadrant of unspecified female breast: Secondary | ICD-10-CM

## 2012-08-19 NOTE — Progress Notes (Signed)
[name]       DOB: Sep 06, 1955           DATE: 08/19/2012       MGN:[id number]  CC:  Chief Complaint  Patient presents with  . Breast Cancer Long Term Follow Up    HPI: this patient underwent right lumpectomy several months ago. She presents today for followup having completed radiation therapy about 5 weeks ago. Overall she thinks that she tolerated that well. She is not having any other problems.  EXAM: Vital signs: BP 160/100  Pulse 106  Temp(Src) 98.1 F (36.7 C) (Temporal)  Resp 18  Ht _0  (1.676 m)  Wt 343 lb (155.584 kg)  BMI 55.39 kg/m2  General: Patient alert, oriented, NAD  Breasts: She has a mild acute radiation changes. There is some increased pigmentation and some edema around the nipple areolar complex. The lobectomy site looks fine. Soft. There is no evidence of any other problems. IMP: did well status post lumpectomy radiation therapy  PLAN: I'll see back in about 5 months for a followup.  Jamina Macbeth J 08/19/2012

## 2012-08-19 NOTE — Patient Instructions (Signed)
See me again in about 5 months for breast cancer followup

## 2012-08-27 ENCOUNTER — Ambulatory Visit
Admission: RE | Admit: 2012-08-27 | Discharge: 2012-08-27 | Disposition: A | Discharge status: Home / Self Care | Payer: Medicare Other | Source: Ambulatory Visit | Attending: Physician Assistant | Admitting: Physician Assistant

## 2012-08-27 DIAGNOSIS — C50411 Malignant neoplasm of upper-outer quadrant of right female breast: Secondary | ICD-10-CM

## 2012-08-27 DIAGNOSIS — Z780 Asymptomatic menopausal state: Secondary | ICD-10-CM

## 2012-09-17 ENCOUNTER — Encounter (HOSPITAL_COMMUNITY): Payer: Self-pay | Admitting: *Deleted

## 2012-09-17 ENCOUNTER — Encounter (HOSPITAL_COMMUNITY): Payer: Self-pay | Admitting: Pharmacy Technician

## 2012-09-20 ENCOUNTER — Telehealth (INDEPENDENT_AMBULATORY_CARE_PROVIDER_SITE_OTHER): Payer: Self-pay

## 2012-09-20 ENCOUNTER — Telehealth: Payer: Self-pay | Admitting: *Deleted

## 2012-09-20 NOTE — Telephone Encounter (Signed)
Patient states she is having swelling in the rt breast and axillary area Appointment made in urg. 09/21/12@ 3:30 patient aware

## 2012-09-20 NOTE — Telephone Encounter (Signed)
Pt called c/o right breast pain and lumps under right axilla.  Pt asked if Dr. Jana Hakim was her surgeon.  Informed pt that Dr. Jana Hakim gave her medication and that Dr. Margot Chimes was her surgeon.  Asked pt several questions pertaining to her "lumps" under her arm and on her right breast.  Pt relate that she thought they might be fluid filled.  Pt relate not changes in soaps or detergents. No redness or drainage noted.  Gave pt Dr. Dickie La office number and informed her to request to speak to nursing.  Received verbal confirmation.  Pt denies further needs at this time.

## 2012-09-21 ENCOUNTER — Encounter (INDEPENDENT_AMBULATORY_CARE_PROVIDER_SITE_OTHER): Payer: Medicare Other | Admitting: Surgery

## 2012-09-23 ENCOUNTER — Emergency Department (HOSPITAL_COMMUNITY)
Admission: EM | Admit: 2012-09-23 | Discharge: 2012-09-23 | Disposition: A | Discharge status: Home / Self Care | Payer: Medicare Other | Attending: Emergency Medicine | Admitting: Emergency Medicine

## 2012-09-23 ENCOUNTER — Emergency Department (HOSPITAL_COMMUNITY): Payer: Medicare Other

## 2012-09-23 ENCOUNTER — Encounter (HOSPITAL_COMMUNITY): Payer: Self-pay | Admitting: *Deleted

## 2012-09-23 DIAGNOSIS — Z8739 Personal history of other diseases of the musculoskeletal system and connective tissue: Secondary | ICD-10-CM | POA: Insufficient documentation

## 2012-09-23 DIAGNOSIS — Z87891 Personal history of nicotine dependence: Secondary | ICD-10-CM | POA: Insufficient documentation

## 2012-09-23 DIAGNOSIS — E785 Hyperlipidemia, unspecified: Secondary | ICD-10-CM | POA: Insufficient documentation

## 2012-09-23 DIAGNOSIS — Z79899 Other long term (current) drug therapy: Secondary | ICD-10-CM | POA: Insufficient documentation

## 2012-09-23 DIAGNOSIS — H538 Other visual disturbances: Secondary | ICD-10-CM | POA: Insufficient documentation

## 2012-09-23 DIAGNOSIS — Z8742 Personal history of other diseases of the female genital tract: Secondary | ICD-10-CM | POA: Insufficient documentation

## 2012-09-23 DIAGNOSIS — M129 Arthropathy, unspecified: Secondary | ICD-10-CM | POA: Insufficient documentation

## 2012-09-23 DIAGNOSIS — F329 Major depressive disorder, single episode, unspecified: Secondary | ICD-10-CM | POA: Insufficient documentation

## 2012-09-23 DIAGNOSIS — E039 Hypothyroidism, unspecified: Secondary | ICD-10-CM | POA: Insufficient documentation

## 2012-09-23 DIAGNOSIS — Z9189 Other specified personal risk factors, not elsewhere classified: Secondary | ICD-10-CM | POA: Insufficient documentation

## 2012-09-23 DIAGNOSIS — Z853 Personal history of malignant neoplasm of breast: Secondary | ICD-10-CM | POA: Insufficient documentation

## 2012-09-23 DIAGNOSIS — Z862 Personal history of diseases of the blood and blood-forming organs and certain disorders involving the immune mechanism: Secondary | ICD-10-CM | POA: Insufficient documentation

## 2012-09-23 DIAGNOSIS — K219 Gastro-esophageal reflux disease without esophagitis: Secondary | ICD-10-CM | POA: Insufficient documentation

## 2012-09-23 DIAGNOSIS — IMO0002 Reserved for concepts with insufficient information to code with codable children: Secondary | ICD-10-CM | POA: Insufficient documentation

## 2012-09-23 DIAGNOSIS — E669 Obesity, unspecified: Secondary | ICD-10-CM | POA: Insufficient documentation

## 2012-09-23 DIAGNOSIS — R079 Chest pain, unspecified: Secondary | ICD-10-CM | POA: Insufficient documentation

## 2012-09-23 DIAGNOSIS — J45909 Unspecified asthma, uncomplicated: Secondary | ICD-10-CM | POA: Insufficient documentation

## 2012-09-23 DIAGNOSIS — F3289 Other specified depressive episodes: Secondary | ICD-10-CM | POA: Insufficient documentation

## 2012-09-23 DIAGNOSIS — Z923 Personal history of irradiation: Secondary | ICD-10-CM | POA: Insufficient documentation

## 2012-09-23 DIAGNOSIS — F411 Generalized anxiety disorder: Secondary | ICD-10-CM | POA: Insufficient documentation

## 2012-09-23 DIAGNOSIS — K625 Hemorrhage of anus and rectum: Secondary | ICD-10-CM | POA: Insufficient documentation

## 2012-09-23 DIAGNOSIS — I10 Essential (primary) hypertension: Secondary | ICD-10-CM

## 2012-09-23 DIAGNOSIS — Z87442 Personal history of urinary calculi: Secondary | ICD-10-CM | POA: Insufficient documentation

## 2012-09-23 DIAGNOSIS — Z8619 Personal history of other infectious and parasitic diseases: Secondary | ICD-10-CM | POA: Insufficient documentation

## 2012-09-23 DIAGNOSIS — Z791 Long term (current) use of non-steroidal anti-inflammatories (NSAID): Secondary | ICD-10-CM | POA: Insufficient documentation

## 2012-09-23 DIAGNOSIS — R42 Dizziness and giddiness: Secondary | ICD-10-CM | POA: Insufficient documentation

## 2012-09-23 DIAGNOSIS — E079 Disorder of thyroid, unspecified: Secondary | ICD-10-CM | POA: Insufficient documentation

## 2012-09-23 DIAGNOSIS — R51 Headache: Secondary | ICD-10-CM | POA: Insufficient documentation

## 2012-09-23 LAB — POCT I-STAT, CHEM 8
BUN: 9 mg/dL (ref 6–23)
Calcium, Ion: 1.08 mmol/L — ABNORMAL LOW (ref 1.12–1.23)
Chloride: 102 mEq/L (ref 96–112)
Creatinine, Ser: 0.8 mg/dL (ref 0.50–1.10)
Glucose, Bld: 249 mg/dL — ABNORMAL HIGH (ref 70–99)
Potassium: 4.2 mEq/L (ref 3.5–5.1)

## 2012-09-23 MED ORDER — ASPIRIN 81 MG PO CHEW
324.0000 mg | CHEWABLE_TABLET | Freq: Once | ORAL | Status: AC
  Administered 2012-09-23: 324 mg via ORAL
  Filled 2012-09-23: qty 4

## 2012-09-23 MED ORDER — HYDROCHLOROTHIAZIDE 25 MG PO TABS: 25.0000 mg | ORAL_TABLET | Freq: Every day | ORAL | Status: DC

## 2012-09-23 NOTE — ED Notes (Signed)
Pt escorted to discharge window. Pt verbalized understanding discharge instructions. In no acute distress.  

## 2012-09-23 NOTE — ED Notes (Signed)
Pt reports cp which started x 2 days ago, recurred this am which lasted x 2 hours. Pt also reports chronic back and bila knee pains.  Pt is A&Ox 4.  Denies cp at this time.  Pt reports recent lumpectomy and rectal admission.

## 2012-09-23 NOTE — ED Notes (Addendum)
Pt reports was at [facility] p[facility] and had HTN. 170/120 and 180/120. Also reports right sided chest pain 1-2 hours ago, 5/10. Referred to ED. Pt reports years ago she was on HTN meds but was taken off them. Reports headache 6/10, also blurry vision and dizziness. Extensive medical hx.   Also reports dark red blood in stool x1 week. Was seen in ED in June for rectal bleeding. Reports she has been to the doctor and is having a colonoscopy on Aug 12.

## 2012-09-23 NOTE — ED Provider Notes (Signed)
CSN: [id number]     Arrival date & time 09/23/12  1328 History     First MD Initiated Contact with Patient 09/23/12 1350     Chief Complaint  Patient presents with  . Chest Pain  . Rectal Bleeding  . Hypertension    HPI Pt reports was at mental health pcp and had HTN. 170/120 and 180/120. Also reports right sided chest pain 1-2 hours ago, 5/10. Referred to ED. Pt reports years ago she was on HTN meds but was taken off them. Reports headache 6/10, also blurry vision and dizziness. Extensive medical hx. patient states that the chest pain was short lasting just seconds.  Was not accompanied by diaphoresis.  Was not accounted by nausea.  Patient is not currently taking any blood pressure medicine but has been told she had high blood pressure in the past.  Her blood pressure when she arrived emerged apartment was 160/72.  Patient has a heart score of 2 which is low risk.  Past Medical History  Diagnosis Date  . Depression   . Thyroid disease   . GERD (gastroesophageal reflux disease)   . Pancreatitis     hx of  . Knee pain, bilateral   . Obesity   . HSV (herpes simplex virus) infection   . Hyperlipemia   . Asthma   . Tachycardia     with sx in 2008  . Breast mass in female     L breast 2008, US showed likely fat necrosis  . Arthritis   . Anxiety   . Heartburn   . Hypertension     sees Dr. Criss Rosales , [facility]  . Hx of radiation therapy 05/05/12- 07/15/12    right breast, 45 gray x 25 fx, lumpectomy cavity boosted to 16.2 gray  . Sleep apnea     uses cpap, pt does not know settings  . History of kidney stones   . Headache(784.0)   . 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  . Anemia   . History of blood transfusion 2011  . Hypothyroidism   . Elevated glucose     pt denies diabetes glucose elevated on cmet glucose  08-03-2012   Past Surgical History  Procedure Laterality Date  . Replacement total knee bilateral Bilateral yrs  ago  . Foot surgery Right 10 yrs ago  . Abdominal surgery    . Thyroid surgery  yrs ago  . Joint replacement    . Abdominal hysterectomy  yrs ago    partial  . Breast surgery Right   . Breast lumpectomy  03/03/2012    Procedure: LUMPECTOMY;  Surgeon: Haywood Lasso, MD;  Location: [facility];  Service: General;  Laterality: Right;  . Cholecystectomy  yrs ago   Family History  Problem Relation Age of Onset  . Heart disease Brother   . Diabetes Brother   . Hypertension Mother   . Diabetes Mother   . Breast cancer Mother 41  . Bone cancer Mother   . Hypertension Sister   . Diabetes Sister   . Breast cancer Sister 37  . Breast cancer Maternal Grandmother   . Heart disease Maternal Grandmother   . Uterine cancer Other 19  . Breast cancer Paternal Aunt 1  . Breast cancer Paternal Grandmother     dx in her 74s  . Prostate cancer Paternal Grandfather    History  Substance Use Topics  . Smoking status: Former Smoker --  1.00 packs/day for 7 years    Types: Cigarettes    Quit date: 03/24/1992  . Smokeless tobacco: Never Used  . Alcohol Use: No   OB History   Grav Para Term Preterm Abortions TAB SAB Ect Mult Living                 Review of Systems All other systems reviewed and are negative Allergies  Shellfish allergy; Bee venom; Morphine and related; and Oxycodone hcl  Home Medications   Current Outpatient Rx  Name  Route  Sig  Dispense  Refill  . anastrozole (ARIMIDEX) 1 MG tablet   Oral   Take 1 mg by mouth at bedtime.         . ARIPiprazole (ABILIFY) 10 MG tablet   Oral   Take 10 mg by mouth every morning.          . budesonide-formoterol (SYMBICORT) 80-4.5 MCG/ACT inhaler   Inhalation   Inhale 1-2 puffs into the lungs daily as needed. For shortness of breath         . CALCIUM PO   Oral   Take 2 tablets by mouth daily.         [name] esomeprazole (NEXIUM) 40 MG capsule   Oral   Take 40 mg by mouth daily before breakfast.         . fluticasone  (CUTIVATE) 0.05 % cream   Topical   Apply 1 application topically 2 (two) times daily as needed (rash).          . hyaluronate sodium (RADIAPLEXRX) GEL   Topical   Apply 1 application topically 2 (two) times daily.         [name] HYDROcodone-acetaminophen (NORCO/VICODIN) 5-325 MG per tablet   Oral   Take 1-2 tablets by mouth every 4 (four) hours as needed for pain.         [name] levothyroxine (SYNTHROID, LEVOTHROID) 200 MCG tablet   Oral   Take 200 mcg by mouth daily before breakfast.          . LORazepam (ATIVAN) 1 MG tablet   Oral   Take 1 mg by mouth every 8 (eight) hours as needed. For anxiety         . meloxicam (MOBIC) 15 MG tablet   Oral   Take 15 mg by mouth every morning.          . Multiple Vitamin (MULTIVITAMIN WITH MINERALS) TABS   Oral   Take 1 tablet by mouth daily.         . non-metallic deodorant Jethro Poling) MISC   Topical   Apply 1 application topically daily as needed (for deodorant/antiperspirant.).          [name] PARoxetine (PAXIL) 20 MG tablet   Oral   Take 30 mg by mouth every morning.         . pravastatin (PRAVACHOL) 40 MG tablet   Oral   Take 80 mg by mouth at bedtime.         . silver sulfADIAZINE (SILVADENE) 1 % cream   Topical   Apply 1 application topically daily.         . traZODone (DESYREL) 100 MG tablet   Oral   Take 100 mg by mouth at bedtime.         . valACYclovir (VALTREX) 1000 MG tablet   Oral   Take 1,000 mg by mouth daily.         . VOLTAREN 1 % GEL  Topical   Apply 2 g topically 2 (two) times daily.          [name] zolpidem (AMBIEN) 10 MG tablet   Oral   Take 10 mg by mouth at bedtime as needed. For sleep         . hydrochlorothiazide (HYDRODIURIL) 25 MG tablet   Oral   Take 1 tablet (25 mg total) by mouth daily.   30 tablet   0    BP 160/72  Pulse 89  Temp(Src) 98.9 F (37.2 C) (Oral)  Resp 16  SpO2 94% Physical Exam  Nursing note and vitals reviewed. Constitutional: She is oriented to person,  place, and time. She appears well-developed and well-nourished. No distress.  HENT:  Head: Normocephalic and atraumatic.  Eyes: Pupils are equal, round, and reactive to light.  Neck: Normal range of motion.  Cardiovascular: Normal rate and intact distal pulses.   Pulmonary/Chest: No respiratory distress.  Abdominal: Normal appearance. She exhibits no distension.  Musculoskeletal: Normal range of motion.  Neurological: She is alert and oriented to person, place, and time. No cranial nerve deficit.  Skin: Skin is warm and dry. No rash noted.  Psychiatric: She has a normal mood and affect. Her behavior is normal.    ED Course   Procedures (including critical care time)  Date: 09/23/2012  Rate: 86  Rhythm: normal sinus rhythm  QRS Axis: normal  Intervals: normal  ST/T Wave abnormalities: Unremarkable  Conduction Disutrbances: none  Narrative Interpretation: unremarkable     Labs Reviewed  POCT I-STAT, CHEM 8 - Abnormal; Notable for the following:    Glucose, Bld 249 (*)    Calcium, Ion 1.08 (*)    Hemoglobin 10.9 (*)    HCT 32.0 (*)    All other components within normal limits  POCT I-STAT TROPONIN I   Dg Chest 2 View  09/23/2012   *RADIOLOGY REPORT*  Clinical Data: Chest pain  CHEST - 2 VIEW  Comparison: August 03, 2012  Findings: There is a subsegmental atelectasis in the left lower lobe.  The lungs are otherwise clear.  Heart is borderline prominent with normal pulmonary vascularity.  No adenopathy.  There is postoperative change in the right neck region.  There are no appreciable bone lesions.  IMPRESSION: Left base atelectasis.  No edema or consolidation.   Original Report Authenticated By: Lowella Grip, M.D.   1. Hypertension     MDM  Patient has no chest pain.  EKG was unremarkable in the history was not consistent with ACS.  Patient had a heart score of 2 and was therefore considered low risk.  At this time I think is reasonable to start her on low-dose  hydrochlorothiazide for high blood pressure.  I instructed for her to return should her chest pain return and last for more than a few seconds.  Dot Lanes, MD 09/23/12 1534

## 2012-09-27 ENCOUNTER — Other Ambulatory Visit: Payer: Self-pay | Admitting: Gastroenterology

## 2012-09-28 ENCOUNTER — Encounter (HOSPITAL_COMMUNITY): Payer: Self-pay | Admitting: Anesthesiology

## 2012-09-28 ENCOUNTER — Encounter (HOSPITAL_COMMUNITY)
Admission: RE | Disposition: A | Discharge status: Home / Self Care | Payer: Self-pay | Source: Ambulatory Visit | Attending: Gastroenterology

## 2012-09-28 ENCOUNTER — Ambulatory Visit (HOSPITAL_COMMUNITY): Payer: Medicare Other | Admitting: Anesthesiology

## 2012-09-28 ENCOUNTER — Ambulatory Visit (HOSPITAL_COMMUNITY)
Admission: RE | Admit: 2012-09-28 | Discharge: 2012-09-28 | Disposition: A | Discharge status: Home / Self Care | Payer: Medicare Other | Source: Ambulatory Visit | Attending: Gastroenterology | Admitting: Gastroenterology

## 2012-09-28 DIAGNOSIS — K625 Hemorrhage of anus and rectum: Secondary | ICD-10-CM | POA: Diagnosis present

## 2012-09-28 DIAGNOSIS — G4730 Sleep apnea, unspecified: Secondary | ICD-10-CM | POA: Insufficient documentation

## 2012-09-28 DIAGNOSIS — K648 Other hemorrhoids: Secondary | ICD-10-CM | POA: Insufficient documentation

## 2012-09-28 DIAGNOSIS — E039 Hypothyroidism, unspecified: Secondary | ICD-10-CM | POA: Insufficient documentation

## 2012-09-28 DIAGNOSIS — K219 Gastro-esophageal reflux disease without esophagitis: Secondary | ICD-10-CM | POA: Insufficient documentation

## 2012-09-28 DIAGNOSIS — I10 Essential (primary) hypertension: Secondary | ICD-10-CM | POA: Insufficient documentation

## 2012-09-28 HISTORY — DX: Hypothyroidism, unspecified: E03.9

## 2012-09-28 HISTORY — PX: COLONOSCOPY WITH PROPOFOL: SHX5780

## 2012-09-28 HISTORY — DX: Personal history of other medical treatment: Z92.89

## 2012-09-28 SURGERY — COLONOSCOPY WITH PROPOFOL
Anesthesia: Monitor Anesthesia Care

## 2012-09-28 MED ORDER — SODIUM CHLORIDE 0.9 % IV SOLN: INTRAVENOUS | Status: DC

## 2012-09-28 MED ORDER — MIDAZOLAM HCL 5 MG/5ML IJ SOLN
INTRAMUSCULAR | Status: DC | PRN
  Administered 2012-09-28: 2 mg via INTRAVENOUS

## 2012-09-28 MED ORDER — LACTATED RINGERS IV SOLN
INTRAVENOUS | Status: DC
  Administered 2012-09-28 (×2): via INTRAVENOUS

## 2012-09-28 MED ORDER — FENTANYL CITRATE 0.05 MG/ML IJ SOLN
INTRAMUSCULAR | Status: DC | PRN
  Administered 2012-09-28: 50 ug via INTRAVENOUS

## 2012-09-28 MED ORDER — PROPOFOL 10 MG/ML IV BOLUS
INTRAVENOUS | Status: DC | PRN
  Administered 2012-09-28 (×4): 25 mg via INTRAVENOUS
  Administered 2012-09-28: 50 mg via INTRAVENOUS
  Administered 2012-09-28: 25 mg via INTRAVENOUS

## 2012-09-28 SURGICAL SUPPLY — 22 items

## 2012-09-28 NOTE — Addendum Note (Signed)
Addended by: Wilford Corner on: 09/28/2012 07:30 AM   Modules accepted: Orders

## 2012-09-28 NOTE — Preoperative (Signed)
Beta Blockers   Reason not to administer Beta Blockers:Not Applicable

## 2012-09-28 NOTE — Op Note (Signed)
[facility] [place], [address]   COLONOSCOPY PROCEDURE REPORT  PATIENT: Tammie, Perez  MR#: [id number] BIRTHDATE: 1955/02/24 , 78  yrs. old GENDER: Female ENDOSCOPIST: Wilford Corner, MD REFERRED BY: PROCEDURE DATE:  09/28/2012 PROCEDURE:   Colonoscopy, diagnostic ASA CLASS:   Class III INDICATIONS:Rectal Bleeding. MEDICATIONS: See Anesthesia Report.  DESCRIPTION OF PROCEDURE:   After the risks benefits and alternatives of the procedure were thoroughly explained, informed consent was obtained.  The Pentax Ped Colon [id number]  endoscope was introduced through the anus and advanced to the cecum, which was identified by both the appendix and ileocecal valve , limited by No adverse events experienced.   The quality of the prep was good. . The instrument was then slowly withdrawn as the colon was fully examined.     FINDINGS:  Rectal exam unremarkable.  Pediatric colonoscope inserted into the colon and advanced to the cecum, where the appendiceal orifice and ileocecal valve were identified.    On careful withdrawal of the colonoscope no mucosal abnormalities were seen. Retroflexion revealed medium-sized internal hemorrhoids with a small amount of bleeding.  COMPLICATIONS: None  IMPRESSION:     Internal hemorrhoids - source of rectal bleeding otherwise normal colonoscopy  RECOMMENDATIONS: Anusol suppositories to use QD prn; Repeat colonoscopy in 10 years    ______________________________ eSigned:  Wilford Corner, MD 09/28/2012 11:02 AM   IB:[id number] Criss Rosales, MD

## 2012-09-28 NOTE — OR Nursing (Signed)
Post op call. Patient reports no problems or concerns.

## 2012-09-28 NOTE — Anesthesia Preprocedure Evaluation (Addendum)
Anesthesia Evaluation  Patient identified by MRN, date of birth, ID band Patient awake    Reviewed: Allergy & Precautions, H&P , NPO status , Patient's Chart, lab work & pertinent test results  Airway Mallampati: II TM Distance: >3 FB Neck ROM: Full    Dental  (+) Missing   Pulmonary asthma , sleep apnea ,  breath sounds clear to auscultation  Pulmonary exam normal       Cardiovascular hypertension, Pt. on medications negative cardio ROS  Rhythm:Regular Rate:Normal     Neuro/Psych  Headaches, PSYCHIATRIC DISORDERS Anxiety Depression    GI/Hepatic Neg liver ROS, GERD-  Medicated,  Endo/Other  Hypothyroidism Morbid obesity  Renal/GU negative Renal ROS  negative genitourinary   Musculoskeletal negative musculoskeletal ROS (+)   Abdominal   Peds negative pediatric ROS (+)  Hematology negative hematology ROS (+)   Anesthesia Other Findings   Reproductive/Obstetrics negative OB ROS                          Anesthesia Physical Anesthesia Plan  ASA: III  Anesthesia Plan: MAC   Post-op Pain Management:    Induction: Intravenous  Airway Management Planned:   Additional Equipment:   Intra-op Plan:   Post-operative Plan:   Informed Consent: I have reviewed the patients History and Physical, chart, labs and discussed the procedure including the risks, benefits and alternatives for the proposed anesthesia with the patient or authorized representative who has indicated his/her understanding and acceptance.   Dental advisory given  Plan Discussed with: CRNA  Anesthesia Plan Comments:        Anesthesia Quick Evaluation

## 2012-09-28 NOTE — Transfer of Care (Signed)
Immediate Anesthesia Transfer of Care Note  Patient: [name]  Procedure(s) Performed: Procedure(s): COLONOSCOPY WITH PROPOFOL (N/A)  Patient Location: PACU  Anesthesia Type:MAC  Level of Consciousness: awake, sedated and patient cooperative  Airway & Oxygen Therapy: Patient Spontanous Breathing and Patient connected to face mask oxygen  Post-op Assessment: Report given to PACU RN and Post -op Vital signs reviewed and stable  Post vital signs: Reviewed and stable  Complications: No apparent anesthesia complications

## 2012-09-28 NOTE — Anesthesia Postprocedure Evaluation (Signed)
  Anesthesia Post-op Note  Patient: [name]  Procedure(s) Performed: Procedure(s) (LRB): COLONOSCOPY WITH PROPOFOL (N/A)  Patient Location: PACU  Anesthesia Type: MAC  Level of Consciousness: awake and alert   Airway and Oxygen Therapy: Patient Spontanous Breathing  Post-op Pain: mild  Post-op Assessment: Post-op Vital signs reviewed, Patient's Cardiovascular Status Stable, Respiratory Function Stable, Patent Airway and No signs of Nausea or vomiting  Last Vitals:  Filed Vitals:   09/28/12 1131  BP: 144/78  Pulse:   Temp:   Resp:     Post-op Vital Signs: stable   Complications: No apparent anesthesia complications

## 2012-09-28 NOTE — H&P (Signed)
  Date of Initial H&P: 09/16/12.  History reviewed, patient examined, no change in status, stable for surgery. Colonoscopy due to rectal bleeding.

## 2012-09-28 NOTE — Interval H&P Note (Signed)
History and Physical Interval Note:  09/28/2012 10:24 AM  [name]  has presented today for surgery, with the diagnosis of rectal bleeding  The various methods of treatment have been discussed with the patient and family. After consideration of risks, benefits and other options for treatment, the patient has consented to  Procedure(s): COLONOSCOPY WITH PROPOFOL (N/A) as a surgical intervention .  The patient's history has been reviewed, patient examined, no change in status, stable for surgery.  I have reviewed the patient's chart and labs.  Questions were answered to the patient's satisfaction.     Cohoe C.

## 2012-09-29 ENCOUNTER — Encounter (HOSPITAL_COMMUNITY): Payer: Self-pay | Admitting: Gastroenterology

## 2012-10-06 ENCOUNTER — Encounter (INDEPENDENT_AMBULATORY_CARE_PROVIDER_SITE_OTHER): Payer: Self-pay | Admitting: Surgery

## 2012-10-06 ENCOUNTER — Other Ambulatory Visit (INDEPENDENT_AMBULATORY_CARE_PROVIDER_SITE_OTHER): Payer: Self-pay

## 2012-10-06 ENCOUNTER — Other Ambulatory Visit (INDEPENDENT_AMBULATORY_CARE_PROVIDER_SITE_OTHER): Payer: Self-pay | Admitting: Surgery

## 2012-10-06 ENCOUNTER — Ambulatory Visit (INDEPENDENT_AMBULATORY_CARE_PROVIDER_SITE_OTHER): Payer: Medicare Other | Admitting: Surgery

## 2012-10-06 VITALS — BP 200/98 | HR 116 | Resp 16 | Ht 65.0 in | Wt 339.0 lb

## 2012-10-06 DIAGNOSIS — Z853 Personal history of malignant neoplasm of breast: Secondary | ICD-10-CM

## 2012-10-06 MED ORDER — HYDROCODONE-ACETAMINOPHEN 5-325 MG PO TABS: 1.0000 | ORAL_TABLET | ORAL | Status: DC | PRN

## 2012-10-06 NOTE — Progress Notes (Signed)
NAME: [name]       DOB: Jul 29, 1955           DATE: 10/06/2012       MRN: [id number]  CC:   Chief Complaint  Patient presents with  . 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 She has no pain and feels lumps in the right breast and axillary area. She is concerned about recurrence  PFSH: She has had no significant changes since the last visit here.  ROS: There have been no significant changes since the last visit here  EXAM:  VS: BP 200/98  Pulse 116  Resp 16  Ht _0  (1.651 m)  Wt 339 lb (153.769 kg)  BMI 56.41 kg/m2  General: The patient is alert, oriented, generally healthy appearing, NAD. Mood and affect are normal.  Breasts:  Large, pendulous breasts, some radiation changes noted on Right, right is tender throughout, I do not appreciate a mass. Left in unremarkable. The lumpectomy site feels like normal breast tissue.   Lymphatics: She has no axillary or supraclavicular adenopathy on either side.  Extremities: Full ROM of the surgical side with no lymphedema noted.  Data Reviewed: Epic notes  Impression: Right breast pain and subjective masses in right breast, mild radiation changes, no obvious recurrence  Plan: Since she is more sympromaic than I would expect, will go ahead and get a mammogram to see if we are missing anything on PE.

## 2012-10-06 NOTE — Patient Instructions (Addendum)
We will order a mammogram to see if we can find a reason for the right breast pain you are having

## 2012-10-21 ENCOUNTER — Ambulatory Visit
Admission: RE | Admit: 2012-10-21 | Discharge: 2012-10-21 | Disposition: A | Discharge status: Home / Self Care | Payer: Medicaid Other | Source: Ambulatory Visit | Attending: Surgery | Admitting: Surgery

## 2012-10-26 ENCOUNTER — Telehealth: Payer: Self-pay | Admitting: Oncology

## 2012-10-26 NOTE — Telephone Encounter (Signed)
Received a call from [facility].  She is having severe pain in her right breast that she says is a throbing pain and also like pins are sticking her.  She says she is not able to move her right arm without her right breast hurting.  She also says her sister had the same treatment and had the same swelling.  She has gone to the lymphadenia clinic and has had good results.  [name] said that Dr. Jana Hakim did not think the swelling was fluid so she was not sent to the lymphedema clinic.  She has been taking 2-4 tablets of hydrocodone-acetaminophen 5/325 mg per day.  She has 10 tablets left and would like a refill.

## 2012-10-27 ENCOUNTER — Telehealth: Payer: Self-pay | Admitting: Oncology

## 2012-10-27 ENCOUNTER — Other Ambulatory Visit: Payer: Self-pay | Admitting: Radiation Oncology

## 2012-10-27 DIAGNOSIS — C50919 Malignant neoplasm of unspecified site of unspecified female breast: Secondary | ICD-10-CM

## 2012-10-27 MED ORDER — HYDROCODONE-ACETAMINOPHEN 5-325 MG PO TABS: 1.0000 | ORAL_TABLET | ORAL | Status: DC | PRN

## 2012-10-27 NOTE — Telephone Encounter (Signed)
Called [name] to let her know that she will be scheduled for the lymphedema clinic tomorrow.  Also let her know that her refill for hydrocondone-acetaminophen 5/325 mg (take 1-2 tablets by mouth every 4 hours as needed for pain. Qty 60. 0 refills) has been called in to [facility].  [name] also asked for more radiaplex.  Advised her to use Aquaphor which is available over the counter.

## 2012-10-28 ENCOUNTER — Telehealth: Payer: Self-pay | Admitting: *Deleted

## 2012-10-28 NOTE — Telephone Encounter (Signed)
CALLED PATIENT TO INFORM OF PT APPT. FOR 11-01-12 AT 3:15 PM, SPOKE WITH PATIENT AND SHE IS AWARE OF THIS APPT.

## 2012-11-01 ENCOUNTER — Ambulatory Visit: Payer: Medicare Other | Attending: Radiation Oncology | Admitting: Physical Therapy

## 2012-11-01 DIAGNOSIS — I890 Lymphedema, not elsewhere classified: Secondary | ICD-10-CM | POA: Insufficient documentation

## 2012-11-01 DIAGNOSIS — IMO0001 Reserved for inherently not codable concepts without codable children: Secondary | ICD-10-CM | POA: Insufficient documentation

## 2012-11-02 ENCOUNTER — Ambulatory Visit: Payer: Medicare Other | Admitting: Physical Therapy

## 2012-11-09 ENCOUNTER — Ambulatory Visit: Payer: Medicare Other

## 2012-11-09 ENCOUNTER — Telehealth: Payer: Self-pay | Admitting: Oncology

## 2012-11-09 NOTE — Telephone Encounter (Signed)
[name] called and said she has a lump under her right arm that has been growing and is very painful. She said she noticed it growing about a month ago and it is now bigger than a golf ball. She said the therapist at the lymphedema clinic said she call to let Dr. Sondra Come know.  Dr. Sondra Come notified and said to have her contact her surgeon, Dr. Margot Chimes.  Called [name] back and let her know to call Dr. Margot Chimes to have the lumped checked.  She agreed to call him.

## 2012-11-10 ENCOUNTER — Other Ambulatory Visit (INDEPENDENT_AMBULATORY_CARE_PROVIDER_SITE_OTHER): Payer: Self-pay | Admitting: Surgery

## 2012-11-10 ENCOUNTER — Encounter (INDEPENDENT_AMBULATORY_CARE_PROVIDER_SITE_OTHER): Payer: Self-pay | Admitting: Surgery

## 2012-11-10 ENCOUNTER — Ambulatory Visit (INDEPENDENT_AMBULATORY_CARE_PROVIDER_SITE_OTHER): Payer: Medicare Other | Admitting: Surgery

## 2012-11-10 VITALS — BP 138/80 | HR 112 | Temp 97.9°F | Resp 15 | Ht 65.0 in | Wt 330.4 lb

## 2012-11-10 DIAGNOSIS — M79621 Pain in right upper arm: Secondary | ICD-10-CM

## 2012-11-10 DIAGNOSIS — N644 Mastodynia: Secondary | ICD-10-CM

## 2012-11-10 DIAGNOSIS — M79609 Pain in unspecified limb: Secondary | ICD-10-CM

## 2012-11-10 NOTE — Progress Notes (Signed)
[name]       DOB: 11-15-1955           DATE: 11/10/2012       PZP:[id number]  CC:  Chief Complaint  Patient presents with  . Follow-up    ck right axilla/ lump getting larger and painfu    HPI: She was in last month with ongoing right breast pain following lumpectomy and radiation therapy. She had a mammogram last month which was negative but she is having ongoing discomfort now more focused in the right axilla. She thinks she can feel a lump, and has actually been having discomfort since January  EXAM: Vital signs: BP 138/80  Pulse 112  Temp(Src) 97.9 F (36.6 C) (Temporal)  Resp 15  Ht _0  (1.651 m)  Wt 330 lb 6.4 oz (149.868 kg)  BMI 54.98 kg/m2  General: Patient alert, oriented, NAD  Axilla: There is tenderness and a question of some fullness, especially noted with her arm extended over her head. There is no evidence of abscess or infection. There is not a clear cut abnormality IMP: Right axillary pain and subjective mass  PLAN: Will see if we can get an ultrasound done to confirm the negative mammogram  Dylan Ruotolo J 11/10/2012

## 2012-11-10 NOTE — Patient Instructions (Signed)
We will request an ultrasound of the right armpit area to see if we can find out why you are having pain.

## 2012-11-11 ENCOUNTER — Ambulatory Visit
Admission: RE | Admit: 2012-11-11 | Discharge: 2012-11-11 | Disposition: A | Discharge status: Home / Self Care | Payer: Medicaid Other | Source: Ambulatory Visit | Attending: Surgery | Admitting: Surgery

## 2012-11-11 ENCOUNTER — Ambulatory Visit: Payer: Medicare Other | Admitting: Physical Therapy

## 2012-11-11 DIAGNOSIS — N644 Mastodynia: Secondary | ICD-10-CM

## 2012-11-17 ENCOUNTER — Ambulatory Visit: Payer: Medicare Other | Attending: Radiation Oncology | Admitting: Physical Therapy

## 2012-11-17 DIAGNOSIS — I890 Lymphedema, not elsewhere classified: Secondary | ICD-10-CM | POA: Insufficient documentation

## 2012-11-17 DIAGNOSIS — IMO0001 Reserved for inherently not codable concepts without codable children: Secondary | ICD-10-CM | POA: Insufficient documentation

## 2012-11-18 ENCOUNTER — Telehealth: Payer: Self-pay | Admitting: *Deleted

## 2012-11-18 ENCOUNTER — Ambulatory Visit (HOSPITAL_BASED_OUTPATIENT_CLINIC_OR_DEPARTMENT_OTHER): Payer: Medicare Other | Admitting: Oncology

## 2012-11-18 ENCOUNTER — Other Ambulatory Visit (HOSPITAL_BASED_OUTPATIENT_CLINIC_OR_DEPARTMENT_OTHER): Payer: Medicare Other | Admitting: Lab

## 2012-11-18 VITALS — BP 119/82 | HR 106 | Temp 98.0°F | Resp 19 | Ht 65.0 in | Wt 334.3 lb

## 2012-11-18 DIAGNOSIS — C50411 Malignant neoplasm of upper-outer quadrant of right female breast: Secondary | ICD-10-CM

## 2012-11-18 DIAGNOSIS — C50419 Malignant neoplasm of upper-outer quadrant of unspecified female breast: Secondary | ICD-10-CM

## 2012-11-18 DIAGNOSIS — D649 Anemia, unspecified: Secondary | ICD-10-CM

## 2012-11-18 DIAGNOSIS — E119 Type 2 diabetes mellitus without complications: Secondary | ICD-10-CM

## 2012-11-18 DIAGNOSIS — I10 Essential (primary) hypertension: Secondary | ICD-10-CM

## 2012-11-18 LAB — COMPREHENSIVE METABOLIC PANEL (CC13)
Albumin: 3.3 g/dL — ABNORMAL LOW (ref 3.5–5.0)
Alkaline Phosphatase: 91 U/L (ref 40–150)
BUN: 15.5 mg/dL (ref 7.0–26.0)
Glucose: 361 mg/dl — ABNORMAL HIGH (ref 70–140)
Potassium: 3.9 mEq/L (ref 3.5–5.1)
Total Bilirubin: 0.28 mg/dL (ref 0.20–1.20)

## 2012-11-18 LAB — CBC WITH DIFFERENTIAL/PLATELET
Basophils Absolute: 0 10*3/uL (ref 0.0–0.1)
Eosinophils Absolute: 0.1 10*3/uL (ref 0.0–0.5)
HGB: 12.2 g/dL (ref 11.6–15.9)
LYMPH%: 24.6 % (ref 14.0–49.7)
MCV: 82.1 fL (ref 79.5–101.0)
MONO%: 3.8 % (ref 0.0–14.0)
NEUT#: 4.4 10*3/uL (ref 1.5–6.5)
NEUT%: 69 % (ref 38.4–76.8)
Platelets: 201 10*3/uL (ref 145–400)

## 2012-11-18 MED ORDER — VITAMIN D 1000 UNITS PO TABS: 1000.0000 [IU] | ORAL_TABLET | Freq: Every day | ORAL | Status: DC
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
## 2012-11-18 NOTE — Telephone Encounter (Signed)
This RN located Cooper Render NP at [facility] per MD request for communication of continued elevated blood sugars - discussed at visit today with pt stating " I have never been told that I have diabetics ".   Pt informed MD she is scheduled to be seen by above provider later this month.  Per Inez Catalina in the office she verified pt is an established pt with appt as stated.  Per discussion and concern for elevated blood sugar appointment rescheduled to 3 pm 10/6.

## 2012-11-18 NOTE — Telephone Encounter (Signed)
appts made and printed...td

## 2012-11-19 NOTE — Addendum Note (Signed)
Addended by: Laureen Abrahams on: 11/19/2012 05:34 PM   Modules accepted: Medications

## 2012-11-20 ENCOUNTER — Encounter (HOSPITAL_COMMUNITY): Payer: Self-pay | Admitting: Emergency Medicine

## 2012-11-20 ENCOUNTER — Emergency Department (HOSPITAL_COMMUNITY)
Admission: EM | Admit: 2012-11-20 | Discharge: 2012-11-20 | Disposition: A | Discharge status: Home / Self Care | Payer: Medicare Other | Attending: Emergency Medicine | Admitting: Emergency Medicine

## 2012-11-20 DIAGNOSIS — Z79899 Other long term (current) drug therapy: Secondary | ICD-10-CM | POA: Insufficient documentation

## 2012-11-20 DIAGNOSIS — Z853 Personal history of malignant neoplasm of breast: Secondary | ICD-10-CM | POA: Insufficient documentation

## 2012-11-20 DIAGNOSIS — E079 Disorder of thyroid, unspecified: Secondary | ICD-10-CM | POA: Insufficient documentation

## 2012-11-20 DIAGNOSIS — E039 Hypothyroidism, unspecified: Secondary | ICD-10-CM | POA: Insufficient documentation

## 2012-11-20 DIAGNOSIS — Z862 Personal history of diseases of the blood and blood-forming organs and certain disorders involving the immune mechanism: Secondary | ICD-10-CM | POA: Insufficient documentation

## 2012-11-20 DIAGNOSIS — F329 Major depressive disorder, single episode, unspecified: Secondary | ICD-10-CM | POA: Insufficient documentation

## 2012-11-20 DIAGNOSIS — Z8619 Personal history of other infectious and parasitic diseases: Secondary | ICD-10-CM | POA: Insufficient documentation

## 2012-11-20 DIAGNOSIS — F411 Generalized anxiety disorder: Secondary | ICD-10-CM | POA: Insufficient documentation

## 2012-11-20 DIAGNOSIS — Z87891 Personal history of nicotine dependence: Secondary | ICD-10-CM | POA: Insufficient documentation

## 2012-11-20 DIAGNOSIS — E119 Type 2 diabetes mellitus without complications: Secondary | ICD-10-CM | POA: Insufficient documentation

## 2012-11-20 DIAGNOSIS — K219 Gastro-esophageal reflux disease without esophagitis: Secondary | ICD-10-CM | POA: Insufficient documentation

## 2012-11-20 DIAGNOSIS — E785 Hyperlipidemia, unspecified: Secondary | ICD-10-CM | POA: Insufficient documentation

## 2012-11-20 DIAGNOSIS — Z8739 Personal history of other diseases of the musculoskeletal system and connective tissue: Secondary | ICD-10-CM | POA: Insufficient documentation

## 2012-11-20 DIAGNOSIS — E669 Obesity, unspecified: Secondary | ICD-10-CM | POA: Insufficient documentation

## 2012-11-20 DIAGNOSIS — Z87442 Personal history of urinary calculi: Secondary | ICD-10-CM | POA: Insufficient documentation

## 2012-11-20 DIAGNOSIS — J45909 Unspecified asthma, uncomplicated: Secondary | ICD-10-CM | POA: Insufficient documentation

## 2012-11-20 DIAGNOSIS — IMO0002 Reserved for concepts with insufficient information to code with codable children: Secondary | ICD-10-CM | POA: Insufficient documentation

## 2012-11-20 DIAGNOSIS — F3289 Other specified depressive episodes: Secondary | ICD-10-CM | POA: Insufficient documentation

## 2012-11-20 DIAGNOSIS — Z923 Personal history of irradiation: Secondary | ICD-10-CM | POA: Insufficient documentation

## 2012-11-20 DIAGNOSIS — I10 Essential (primary) hypertension: Secondary | ICD-10-CM | POA: Insufficient documentation

## 2012-11-20 LAB — URINALYSIS, ROUTINE W REFLEX MICROSCOPIC
Hgb urine dipstick: NEGATIVE
Specific Gravity, Urine: 1.027 (ref 1.005–1.030)
Urobilinogen, UA: 2 mg/dL — ABNORMAL HIGH (ref 0.0–1.0)

## 2012-11-20 LAB — POCT I-STAT, CHEM 8
Calcium, Ion: 1.2 mmol/L (ref 1.12–1.23)
Glucose, Bld: 210 mg/dL — ABNORMAL HIGH (ref 70–99)
HCT: 39 % (ref 36.0–46.0)
Hemoglobin: 13.3 g/dL (ref 12.0–15.0)
Potassium: 3.7 mEq/L (ref 3.5–5.1)
TCO2: 28 mmol/L (ref 0–100)

## 2012-11-20 LAB — URINE MICROSCOPIC-ADD ON

## 2012-11-20 LAB — GLUCOSE, CAPILLARY: Glucose-Capillary: 219 mg/dL — ABNORMAL HIGH (ref 70–99)

## 2012-11-20 MED ORDER — METFORMIN HCL 500 MG PO TABS: 500.0000 mg | ORAL_TABLET | Freq: Two times a day (BID) | ORAL | Status: DC

## 2012-11-20 MED ORDER — METFORMIN HCL 850 MG PO TABS
850.0000 mg | ORAL_TABLET | Freq: Once | ORAL | Status: AC
  Administered 2012-11-20: 850 mg via ORAL
  Filled 2012-11-20: qty 1

## 2012-11-20 NOTE — ED Provider Notes (Signed)
CSN: [id number]     Arrival date & time 11/20/12  1524 History   First MD Initiated Contact with Patient 11/20/12 1634     Chief Complaint  Patient presents with  . Hyperglycemia   (Consider location/radiation/quality/duration/timing/severity/associated sxs/prior Treatment) HPI This is a 65 are female comes in today complaining she is hyperglycemic. She was seen yesterday at the cancer center and had her blood sugar checked and it was noted to be elevated at 362. She's advised to followup with her primary care doctor. The diabetes is new in onset. She had not noted any specific symptoms the diabetes other than some generalized weakness. She has been at home and checked her blood sugar her brothers monitor and noted that it had gone down and then come back up again. His not been as high as it was yesterday. Had been around 300. She presents today because she is concerned about her new onset diabetes. She does not describe any increase urination, hunger, chest pain, nausea, vomiting, or diarrhea. She is followed at the cancer center for breast cancer.She is on oral antineoplastic- arimidex Past Medical History  Diagnosis Date  . Depression   . Thyroid disease   . GERD (gastroesophageal reflux disease)   . Pancreatitis     hx of  . Knee pain, bilateral   . Obesity   . HSV (herpes simplex virus) infection   . Hyperlipemia   . Asthma   . Tachycardia     with sx in 2008  . Breast mass in female     L breast 2008, US showed likely fat necrosis  . Arthritis   . Anxiety   . Heartburn   . Hypertension     sees Dr. Criss Rosales , [facility]  . Hx of radiation therapy 05/05/12- 07/15/12    right breast, 45 gray x 25 fx, lumpectomy cavity boosted to 16.2 gray  . Sleep apnea     uses cpap, pt does not know settings  . History of kidney stones   . Headache(784.0)   . 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  . Anemia   . History of blood  transfusion 2011  . Hypothyroidism   . Elevated glucose     pt denies diabetes glucose elevated on cmet glucose  08-03-2012   Past Surgical History  Procedure Laterality Date  . Replacement total knee bilateral Bilateral yrs ago  . Foot surgery Right 10 yrs ago  . Abdominal surgery    . Thyroid surgery  yrs ago  . Joint replacement    . Abdominal hysterectomy  yrs ago    partial  . Breast surgery Right   . Breast lumpectomy  03/03/2012    Procedure: LUMPECTOMY;  Surgeon: Haywood Lasso, MD;  Location: [facility];  Service: General;  Laterality: Right;  . Cholecystectomy  yrs ago  . Colonoscopy with propofol N/A 09/28/2012    Procedure: COLONOSCOPY WITH PROPOFOL;  Surgeon: Lear Ng, MD;  Location: [facility];  Service: Endoscopy;  Laterality: N/A;   Family History  Problem Relation Age of Onset  . Heart disease Brother   . Diabetes Brother   . Hypertension Mother   . Diabetes Mother   . Breast cancer Mother 19  . Bone cancer Mother   . Hypertension Sister   . Diabetes Sister   . Breast cancer Sister 30  . Breast cancer Maternal Grandmother   . Heart disease Maternal Grandmother   .  Uterine cancer Other 19  . Breast cancer Paternal Aunt 36  . Breast cancer Paternal Grandmother     dx in her 35s  . Prostate cancer Paternal Grandfather    History  Substance Use Topics  . Smoking status: Former Smoker -- 1.00 packs/day for 7 years    Types: Cigarettes    Quit date: 03/24/1992  . Smokeless tobacco: Never Used  . Alcohol Use: No   OB History   Grav Para Term Preterm Abortions TAB SAB Ect Mult Living                 Review of Systems  All other systems reviewed and are negative.    Allergies  Shellfish allergy; Bee venom; Morphine and related; and Oxycodone hcl  Home Medications   Current Outpatient Rx  Name  Route  Sig  Dispense  Refill  . anastrozole (ARIMIDEX) 1 MG tablet   Oral   Take 1 mg by mouth at bedtime.         . ARIPiprazole  (ABILIFY) 10 MG tablet   Oral   Take 10 mg by mouth every morning.          . budesonide-formoterol (SYMBICORT) 80-4.5 MCG/ACT inhaler   Inhalation   Inhale 1-2 puffs into the lungs daily as needed. For shortness of breath         . CALCIUM PO   Oral   Take 2 tablets by mouth daily.         . cholecalciferol (VITAMIN D) 1000 UNITS tablet   Oral   Take 1 tablet (1,000 Units total) by mouth daily.   30 tablet   12   . desonide (DESOWEN) 0.05 % ointment   Topical   Apply 1 application topically 2 (two) times daily.          [name] esomeprazole (NEXIUM) 40 MG capsule   Oral   Take 40 mg by mouth daily before breakfast.         . fluticasone (CUTIVATE) 0.05 % cream   Topical   Apply 1 application topically 2 (two) times daily as needed (rash).          . hyaluronate sodium (RADIAPLEXRX) GEL   Topical   Apply 1 application topically 2 (two) times daily.         . hydrochlorothiazide (HYDRODIURIL) 25 MG tablet   Oral   Take 1 tablet (25 mg total) by mouth daily.   30 tablet   0   . HYDROcodone-acetaminophen (NORCO/VICODIN) 5-325 MG per tablet   Oral   Take 1-2 tablets by mouth every 4 (four) hours as needed for pain.   60 tablet   0   . levothyroxine (SYNTHROID, LEVOTHROID) 200 MCG tablet   Oral   Take 300 mcg by mouth daily before breakfast.          . LORazepam (ATIVAN) 1 MG tablet   Oral   Take 1 mg by mouth every 8 (eight) hours as needed. For anxiety         . meloxicam (MOBIC) 15 MG tablet   Oral   Take 15 mg by mouth every morning.          . Multiple Vitamin (MULTIVITAMIN WITH MINERALS) TABS   Oral   Take 1 tablet by mouth daily.         . non-metallic deodorant Jethro Poling) MISC   Topical   Apply 1 application topically daily as needed (for deodorant/antiperspirant.).          [name]  PARoxetine (PAXIL) 20 MG tablet   Oral   Take 30 mg by mouth every morning.         . pravastatin (PRAVACHOL) 40 MG tablet   Oral   Take 80 mg by mouth  at bedtime.         . silver sulfADIAZINE (SILVADENE) 1 % cream   Topical   Apply 1 application topically daily.         . traZODone (DESYREL) 100 MG tablet   Oral   Take 100 mg by mouth at bedtime.         . valACYclovir (VALTREX) 1000 MG tablet   Oral   Take 1,000 mg by mouth daily.         . VOLTAREN 1 % GEL   Topical   Apply 2 g topically 2 (two) times daily.          [name] zolpidem (AMBIEN) 10 MG tablet   Oral   Take 10 mg by mouth at bedtime as needed. For sleep          BP 126/66  Pulse 106  Temp(Src) 98.3 F (36.8 C) (Oral)  Resp 16  SpO2 94% Physical Exam  Nursing note and vitals reviewed. Constitutional: She is oriented to person, place, and time.  obese  HENT:  Head: Normocephalic and atraumatic.  Right Ear: External ear normal.  Left Ear: External ear normal.  Nose: Nose normal.  Mouth/Throat: Oropharynx is clear and moist.  Eyes: Conjunctivae and EOM are normal. Pupils are equal, round, and reactive to light.  Neck: Normal range of motion. Neck supple.  Cardiovascular: Normal rate, regular rhythm, normal heart sounds and intact distal pulses.   Pulmonary/Chest: Effort normal and breath sounds normal.  Abdominal: Soft. Bowel sounds are normal.  Musculoskeletal: Normal range of motion.  Neurological: She is alert and oriented to person, place, and time. She has normal reflexes.  Skin: Skin is warm and dry.  Psychiatric: She has a normal mood and affect. Her behavior is normal. Judgment and thought content normal.    ED Course  Procedures (including critical care time) Labs Review Labs Reviewed  URINALYSIS, ROUTINE W REFLEX MICROSCOPIC - Abnormal; Notable for the following:    Glucose, UA >1000 (*)    Urobilinogen, UA 2.0 (*)    All other components within normal limits  GLUCOSE, CAPILLARY - Abnormal; Notable for the following:    Glucose-Capillary 219 (*)    All other components within normal limits  POCT I-STAT, CHEM 8 - Abnormal;  Notable for the following:    Glucose, Bld 210 (*)    All other components within normal limits  URINE MICROSCOPIC-ADD ON   Imaging Review No results found.  MDM  No diagnosis found. New onset diabetes with hyperglycemia without evidence of acidosis.  Plan metformin and advised close follow up with pmd this week.    Shaune Pollack, MD 11/20/12 2002

## 2012-11-20 NOTE — ED Notes (Signed)
Pt resting comfortably with family at bedside.  Pt states she was told at the [facility] on Thursday that her blood glucose was 316.  Pt was referred to her PCP with whom she has an appt on Monday.  Pt has not been prescribed anything to control her blood sugar yet.  Pt denies pain or discomfort.

## 2012-11-20 NOTE — ED Notes (Signed)
Pt appears alert, oriented. NAD noted, ABC intact.

## 2012-11-20 NOTE — ED Notes (Signed)
Pt states that she just found out that she has diabetes and does not have insulin.  States that she took her blood sugar today and it was 329.

## 2012-11-22 ENCOUNTER — Encounter: Payer: Medicare Other | Admitting: Physical Therapy

## 2012-11-22 LAB — GLUCOSE, CAPILLARY: Glucose-Capillary: 240 mg/dL — ABNORMAL HIGH (ref 70–99)

## 2012-11-23 ENCOUNTER — Encounter: Payer: Medicare Other | Admitting: Physical Therapy

## 2012-11-24 ENCOUNTER — Ambulatory Visit: Payer: Medicare Other

## 2012-11-28 IMAGING — CT CT HEAD W/O CM
1 series · 15 of 30 positions shown, 19 images · non-contrast
Comparison: None.

CLINICAL DATA: Dizziness, weakness; status post syncope and fall.
Hit left side of head.

CT HEAD WITHOUT CONTRAST
TECHNIQUE: Contiguous axial images were obtained from the base of
the skull through the vertex without contrast.

[Series 3: head trauma 4.8 h37s · axial · 0.47mm/px · z∈[-157,+6]mm · 15 of 36 slices shown, 19 images]
[im 2/36  brain]
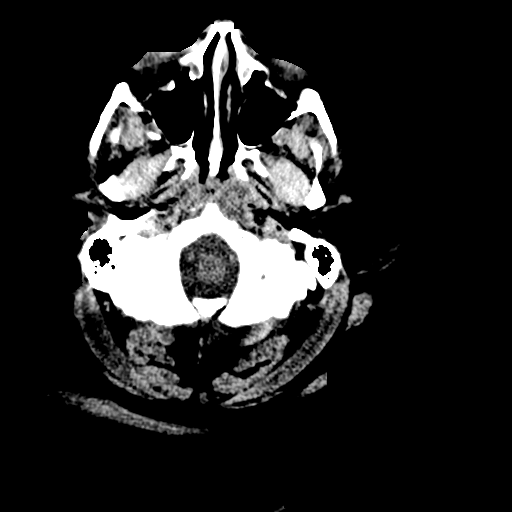
[im 2/36  bone]
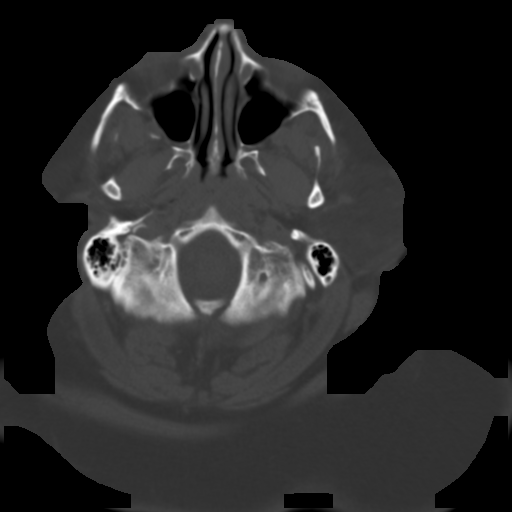
[im 4/36  brain]
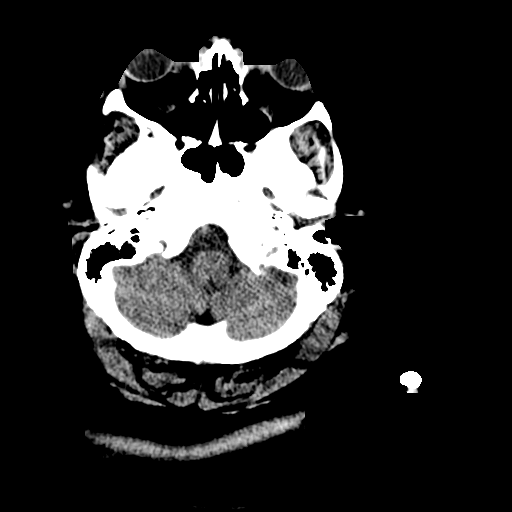
[im 7/36  brain]
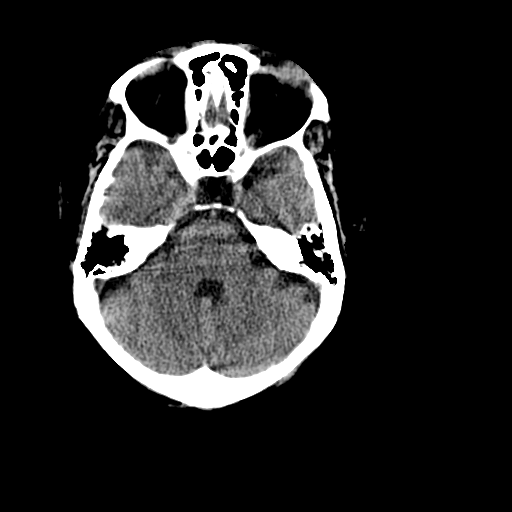
[im 9/36  brain]
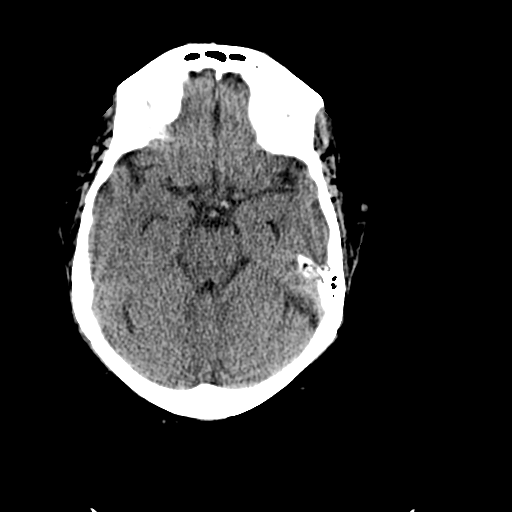
[im 11/36  brain]
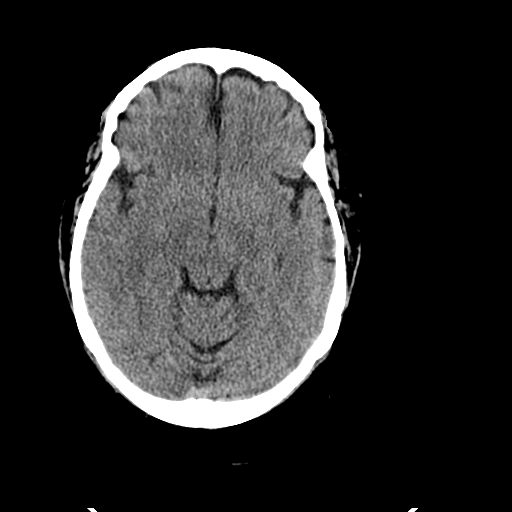
[im 11/36  bone]
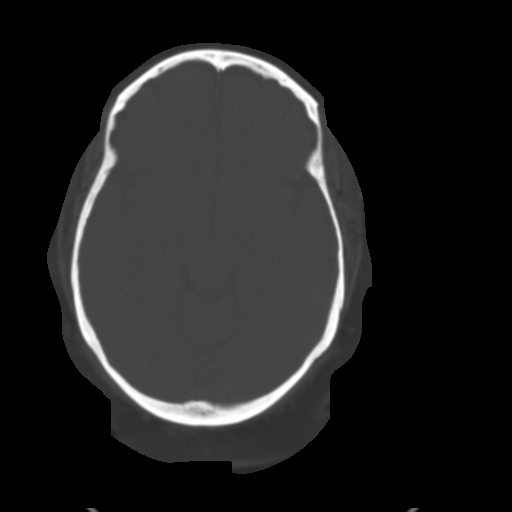
[im 14/36  brain]
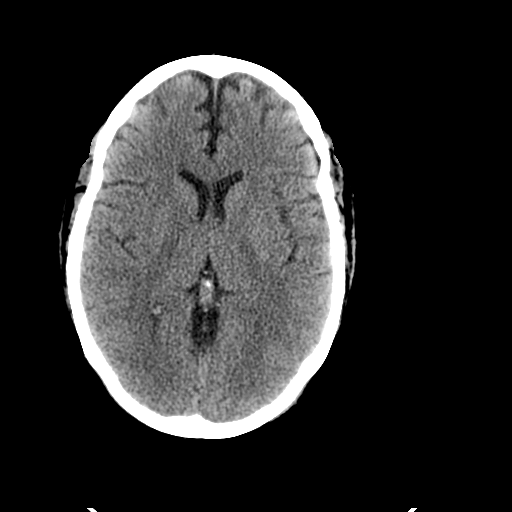
[im 16/36  brain]
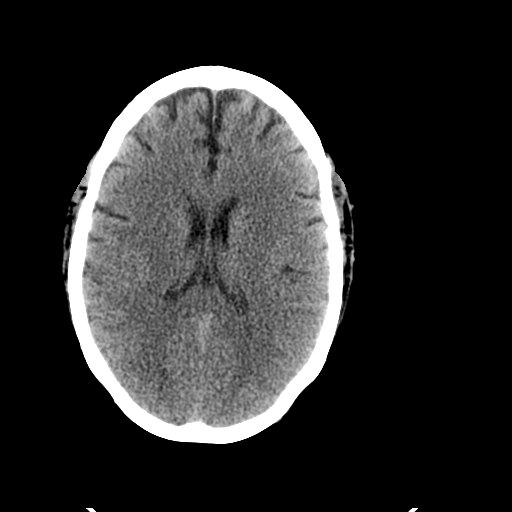
[im 19/36  brain]
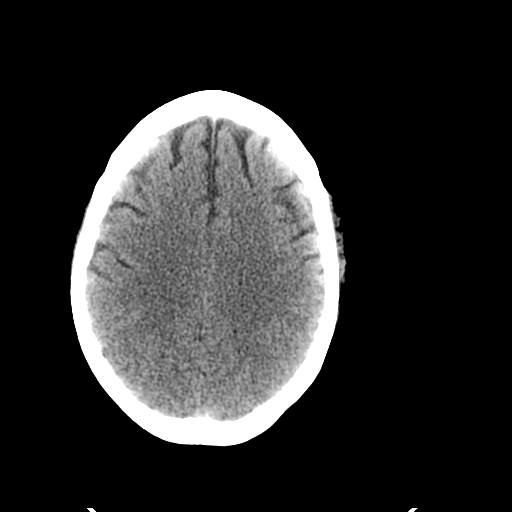
[im 20/36  brain]
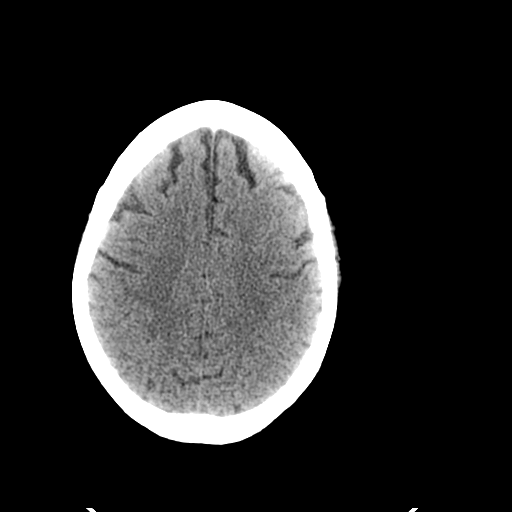
[im 20/36  bone]
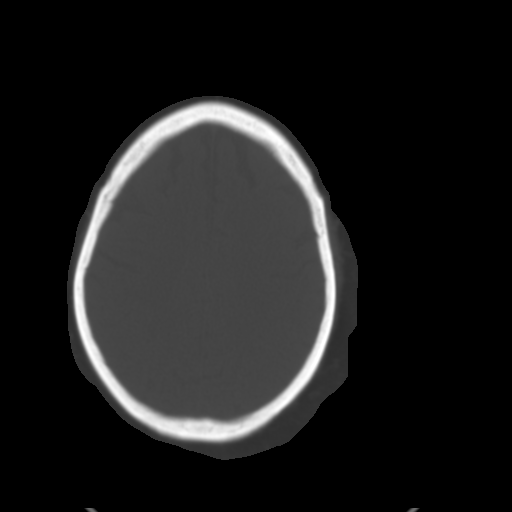
[im 22/36  brain]
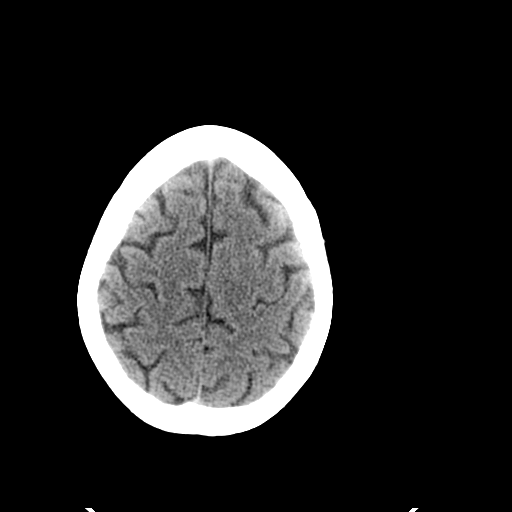
[im 25/36  brain]
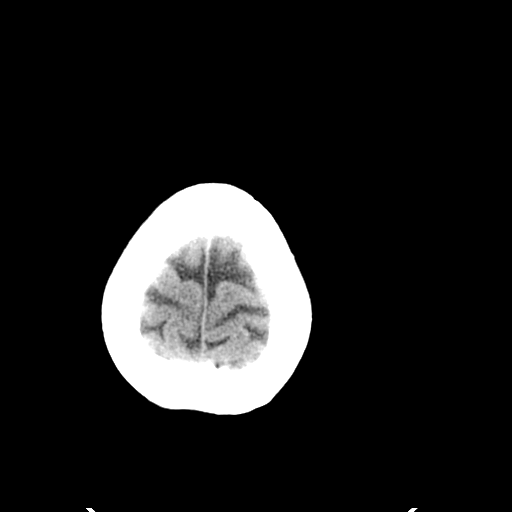
[im 27/36  brain]
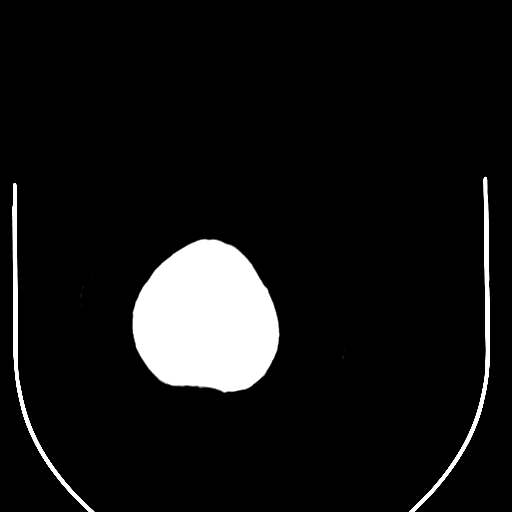
[im 29/36  brain]
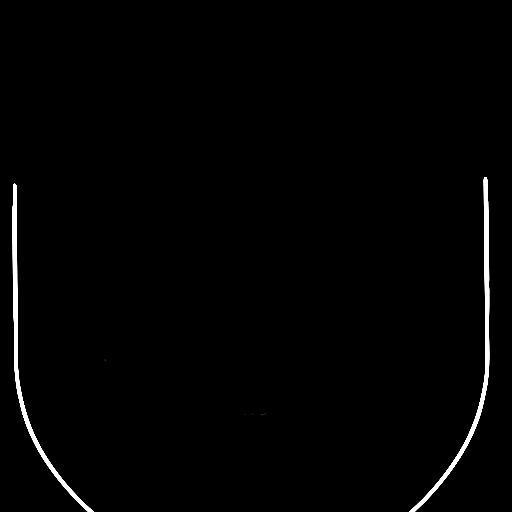
[im 29/36  bone]
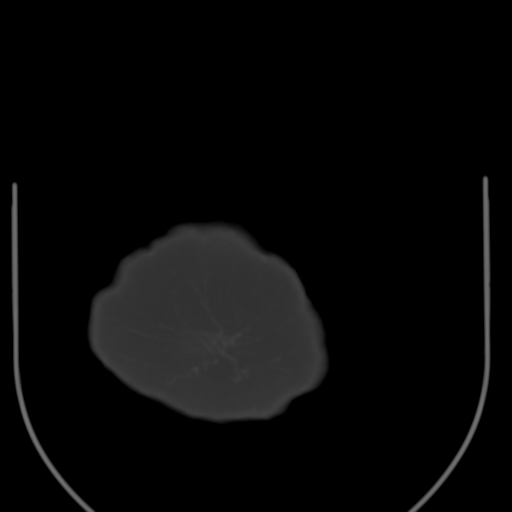
[im 32/36  brain]
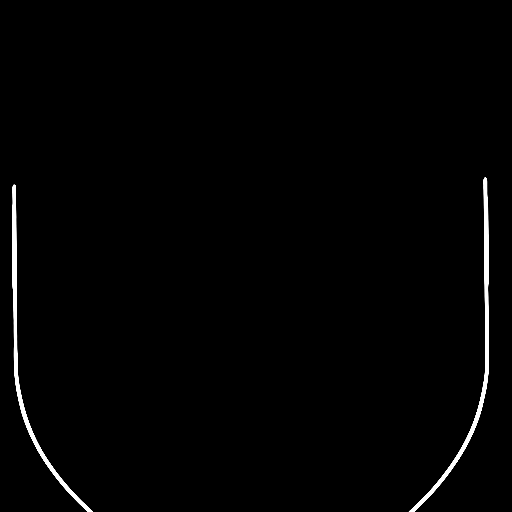
[im 34/36  brain]
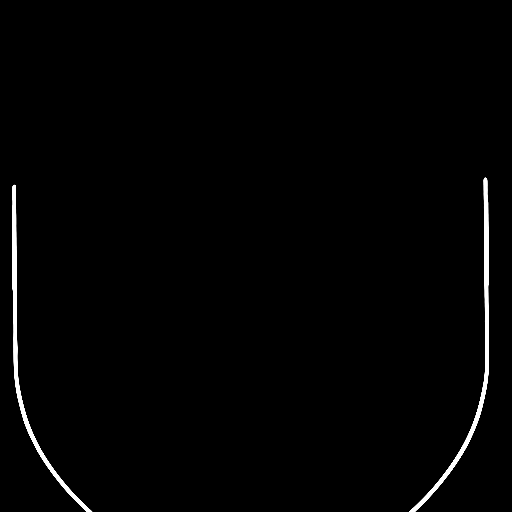

[15 of 30 positions shown; findings below may reference images not displayed]

FINDINGS: There is no evidence of acute infarction, mass lesion, or
intra- or extra-axial hemorrhage on CT.

The posterior fossa, including the cerebellum, brainstem and fourth
ventricle, is within normal limits.  The third and lateral
ventricles, and basal ganglia are unremarkable in appearance.  The
cerebral hemispheres are symmetric in appearance, with normal gray-
white differentiation.  No mass effect or midline shift is seen.

There is no evidence of fracture; visualized osseous structures are
unremarkable in appearance.  The visualized portions of the orbits
are within normal limits.  The paranasal sinuses and mastoid air
cells are well-aerated.  No significant soft tissue abnormalities
are seen.  A large amount of cerumen is noted filling the right
external auditory canal, and a small amount of cerumen is noted
within the left external auditory canal.
IMPRESSION: 1.  No evidence of traumatic intracranial injury or fracture.
2.  Large amount of cerumen noted filling the right external
auditory canal, and a small amount of cerumen within the left
external auditory canal.

## 2012-11-28 IMAGING — CR DG CHEST 2V
2 series · 2 of 2 positions shown · non-contrast
Comparison: 05/03/2009and 07/19/2007

CLINICAL DATA: Shortness of breath.  Syncope.

CHEST - 2 VIEW

[w chest pa]
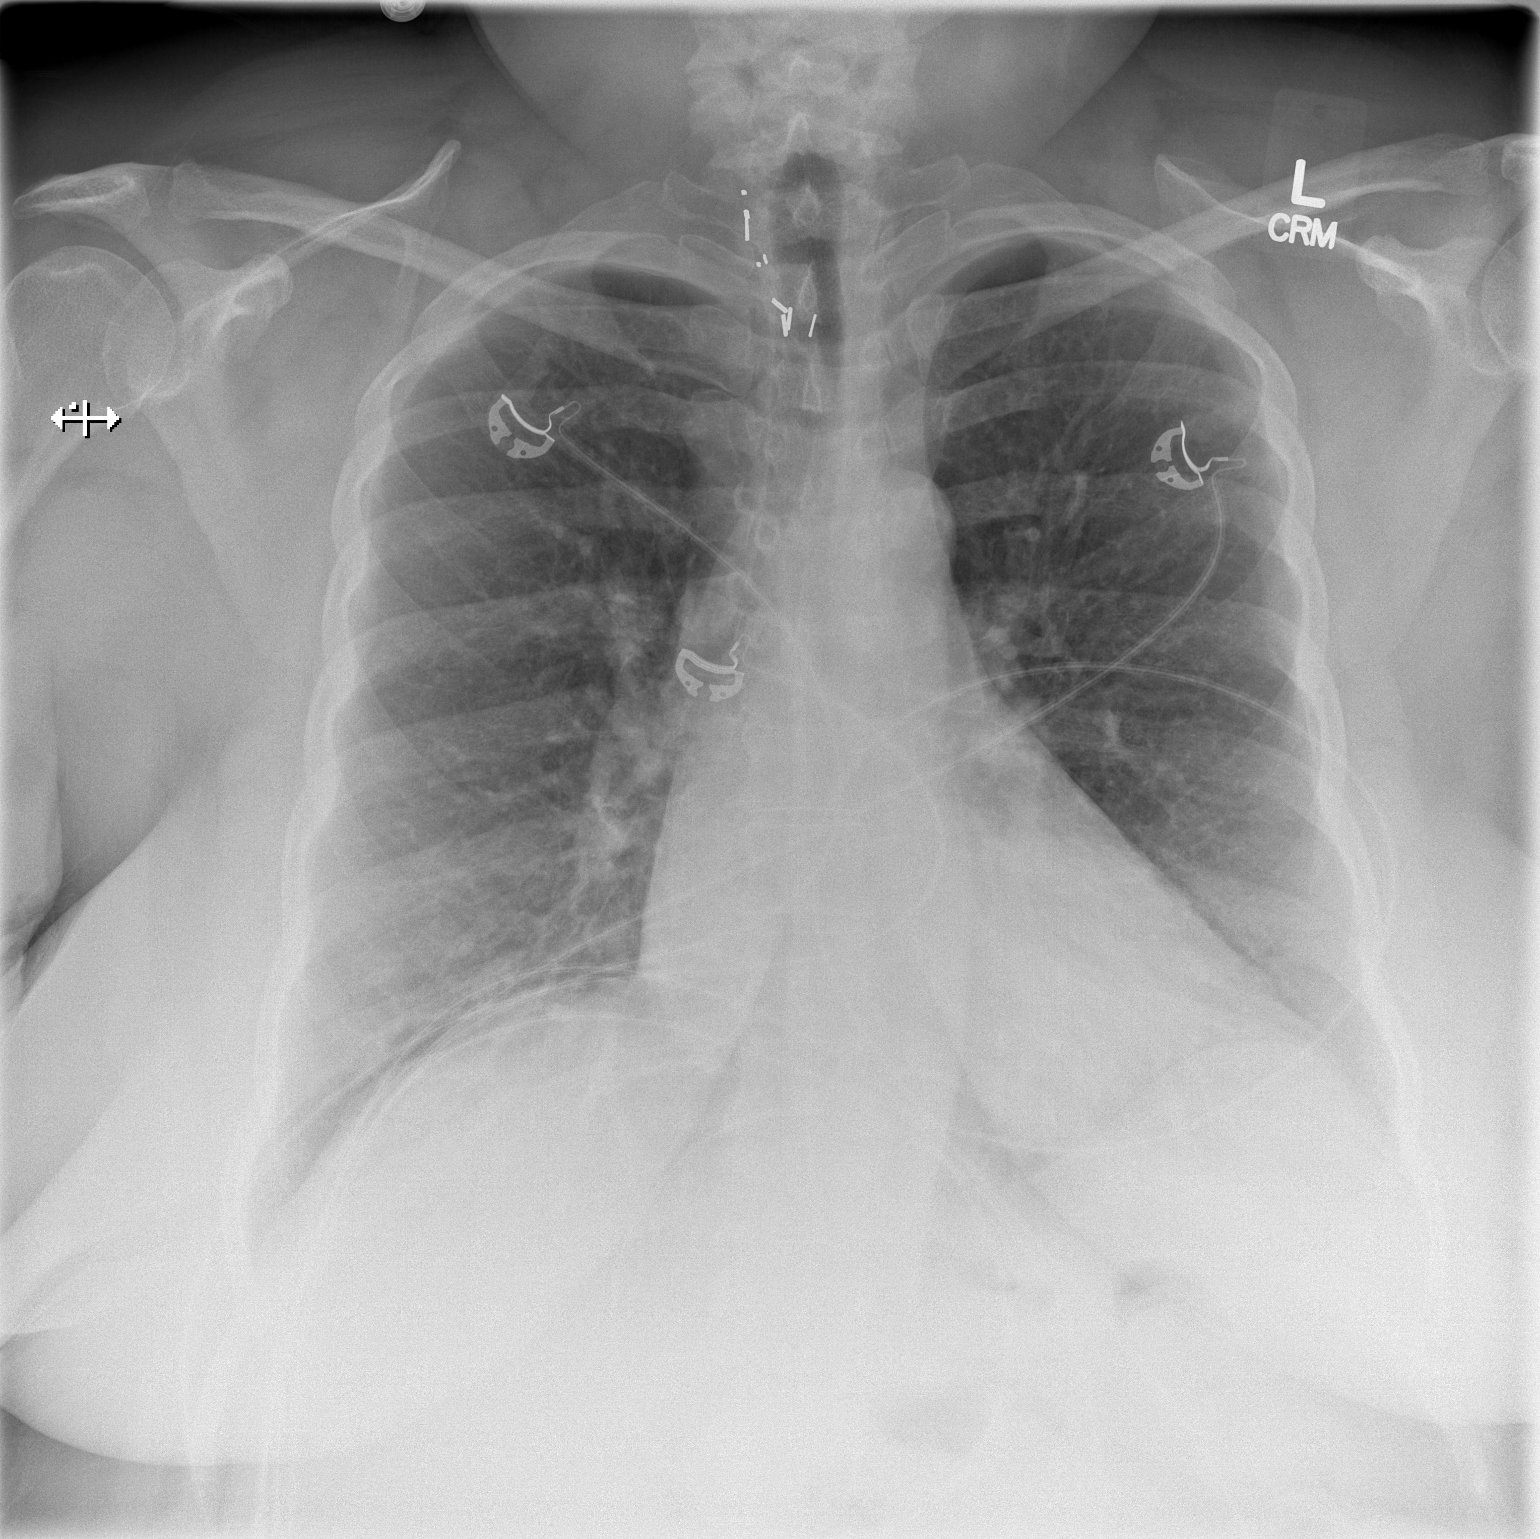

[w chest lat]
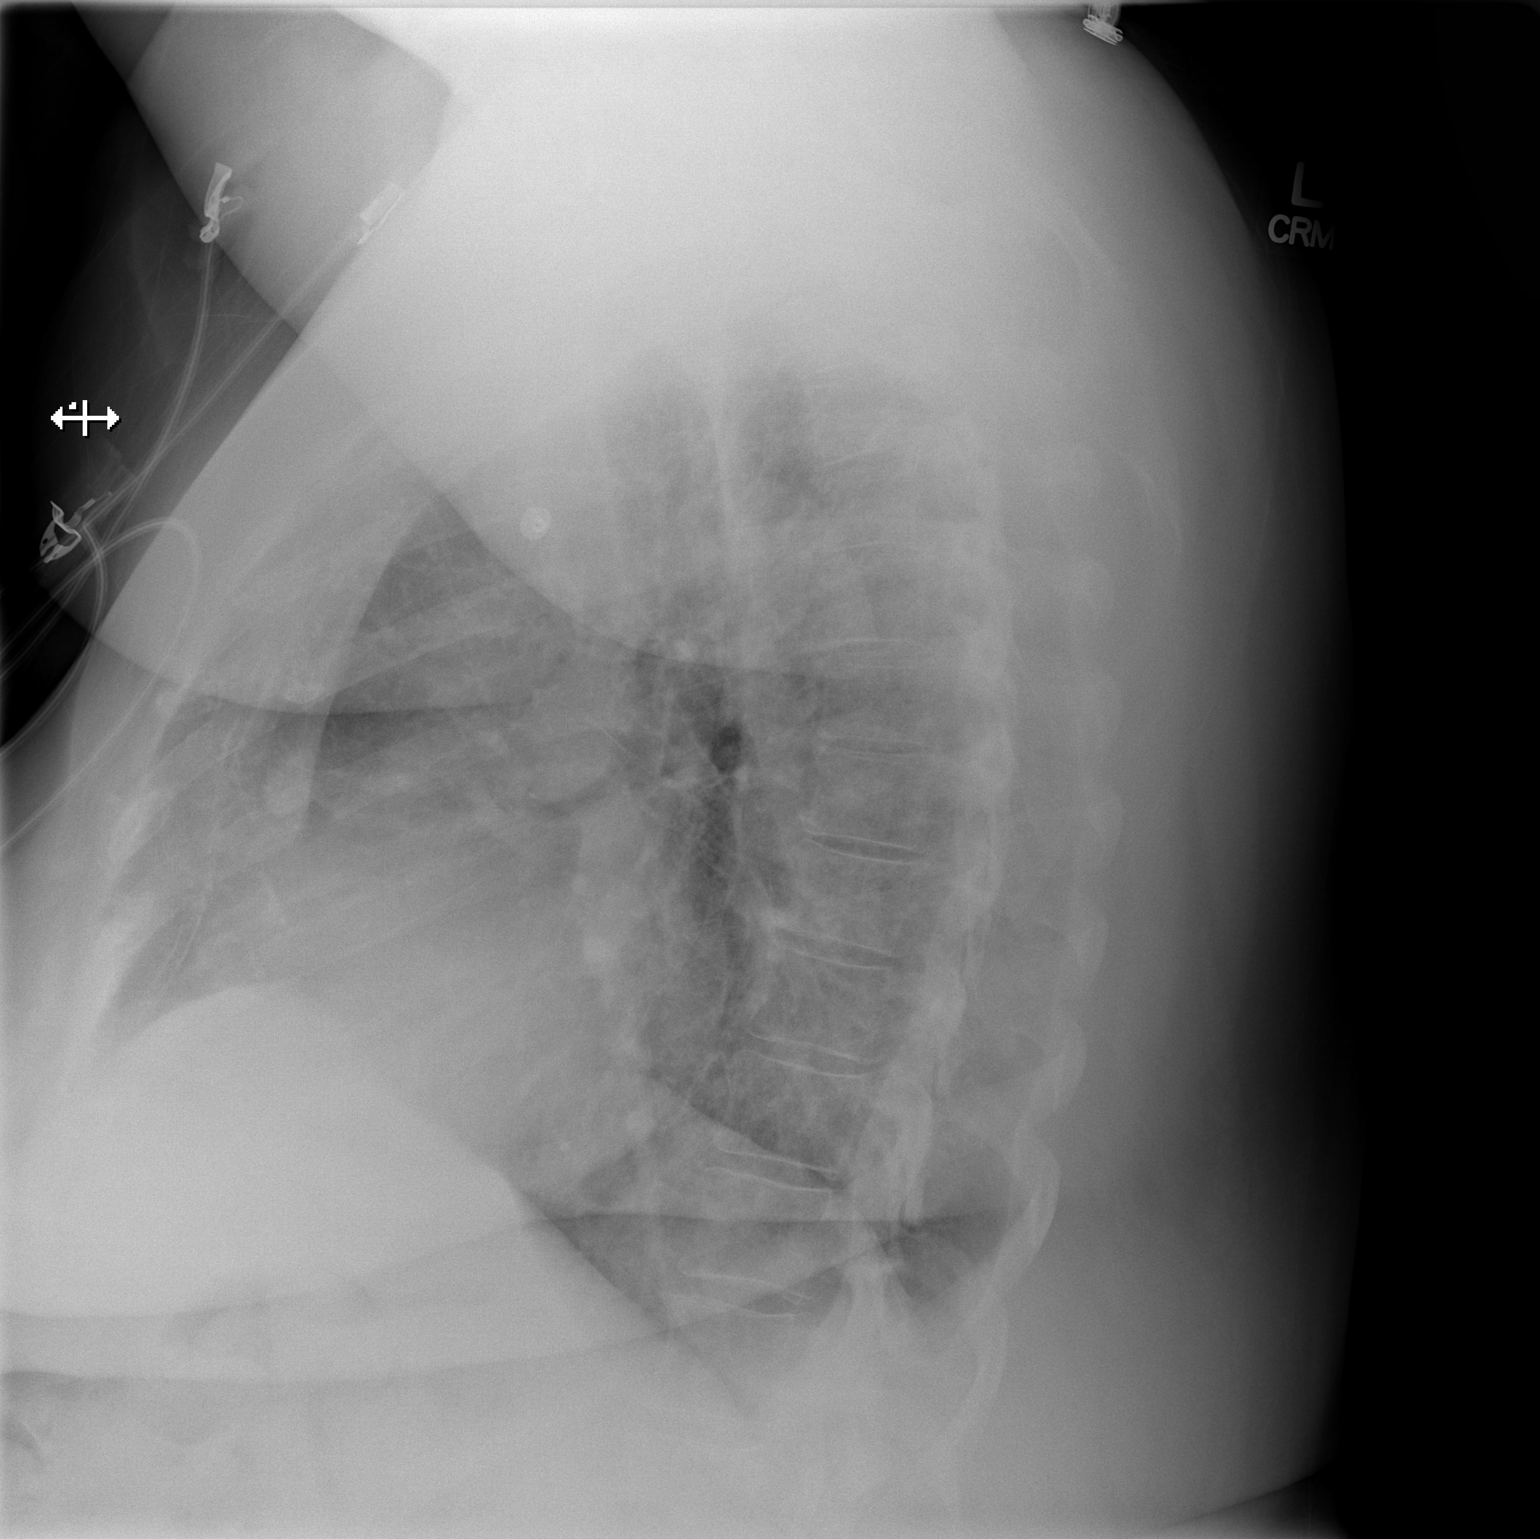

[2 of 2 positions shown; findings below may reference images not displayed]

FINDINGS: Surgical clips in the right thoracic inlet.  Borderline
cardiomegaly is stable.  Chronic interstitial prominence is stable.
No focal airspace opacities, pleural effusion, or pneumothorax is
identified.
IMPRESSION: Stable chest radiograph.  Chronic interstitial prominence and
borderline cardiomegaly.  No acute findings identified.

## 2012-11-29 IMAGING — CT CT ANGIO CHEST
2 of 6 series · 19 of 46 positions shown · IV contrast (APPLIED)
Comparison: Chest radiograph performed 06/03/2011, and V/Q scan
performed 07/11/2007

CLINICAL DATA: Chest pain and shortness of breath; dizziness.
Status post fall.

CT ANGIOGRAPHY CHEST
TECHNIQUE: Multidetector CT imaging of the chest using the
standard protocol during bolus administration of intravenous
contrast. Multiplanar reconstructed images including MIPs were
obtained and reviewed to evaluate the vascular anatomy.
Contrast: 100mL OMNIPAQUE IOHEXOL 350 MG/ML SOLN

[Series 6: pulm embolism 1.0 b25f thi · axial · 0.70mm/px · z∈[+1038,+1311]mm · 16 of 301 slices shown]
[im 14/301  lung]
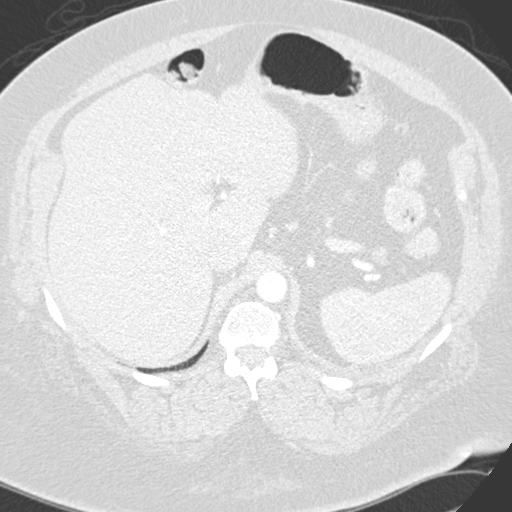
[im 40/301  soft-tissue]
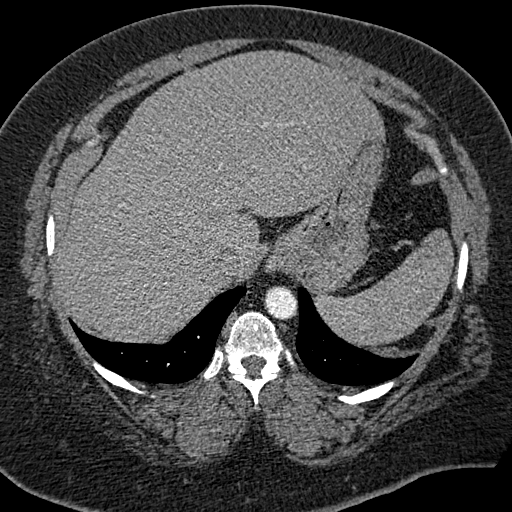
[im 53/301  lung]
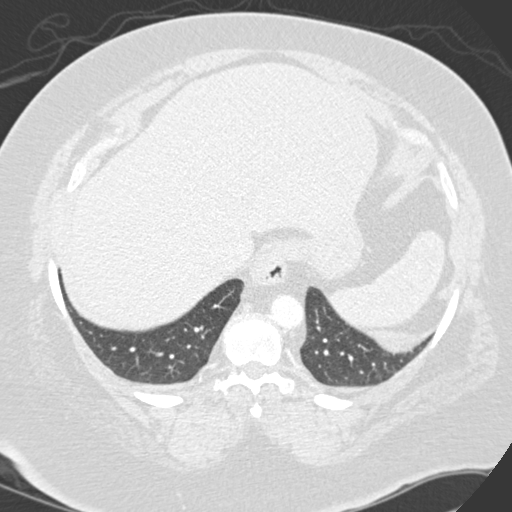
[im 66/301  soft-tissue]
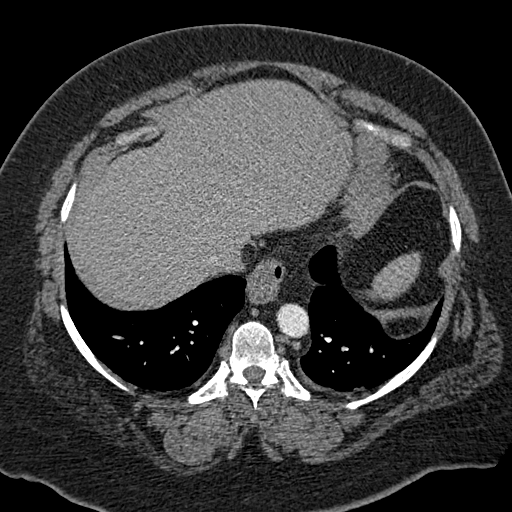
[im 92/301  lung]
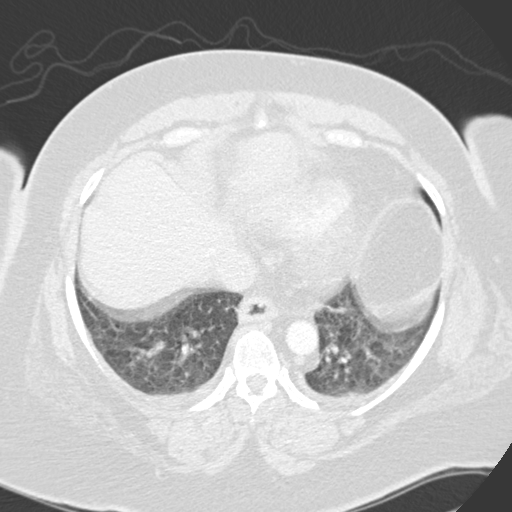
[im 105/301  soft-tissue]
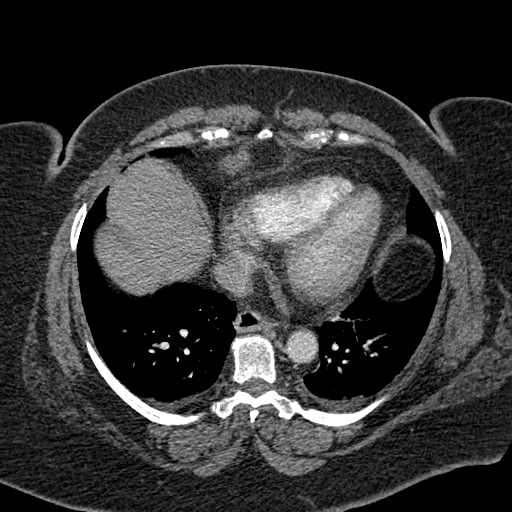
[im 118/301  lung]
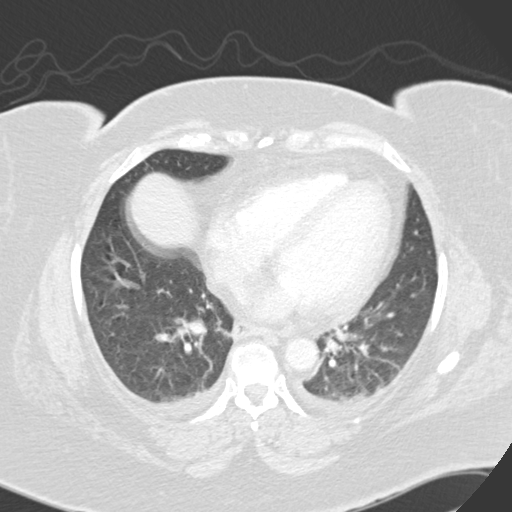
[im 144/301  soft-tissue]
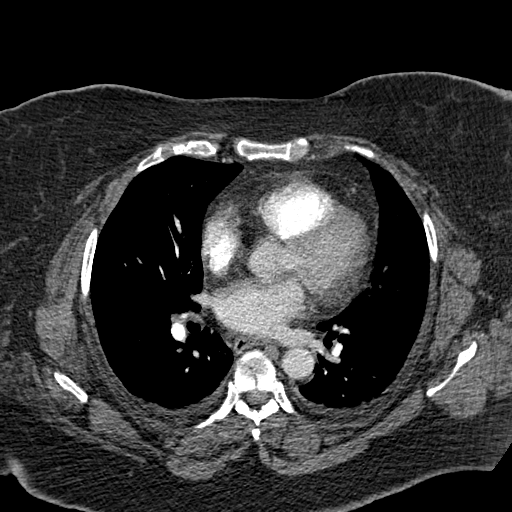
[im 157/301  lung]
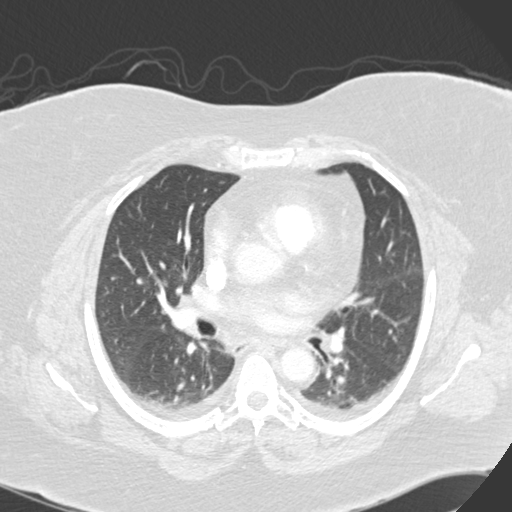
[im 183/301  soft-tissue]
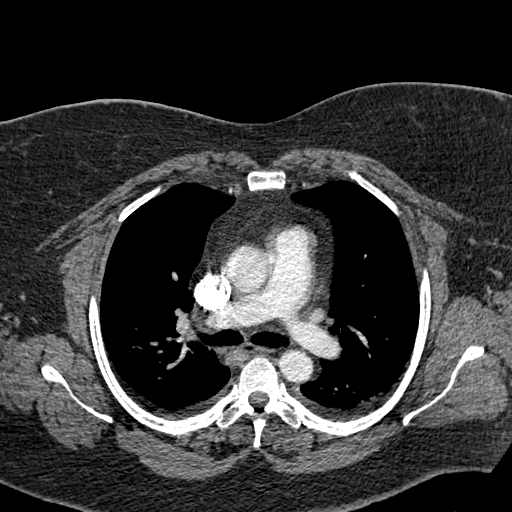
[im 196/301  lung]
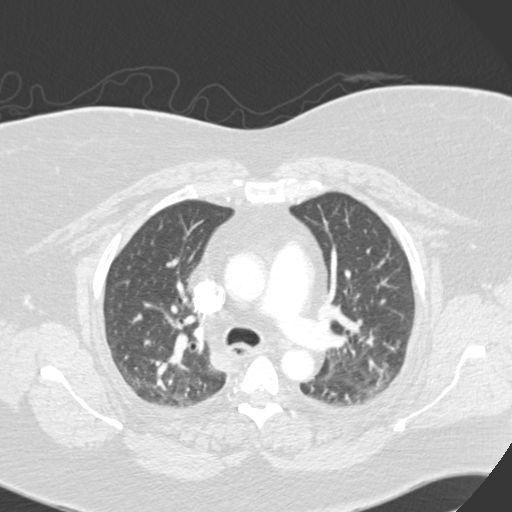
[im 209/301  soft-tissue]
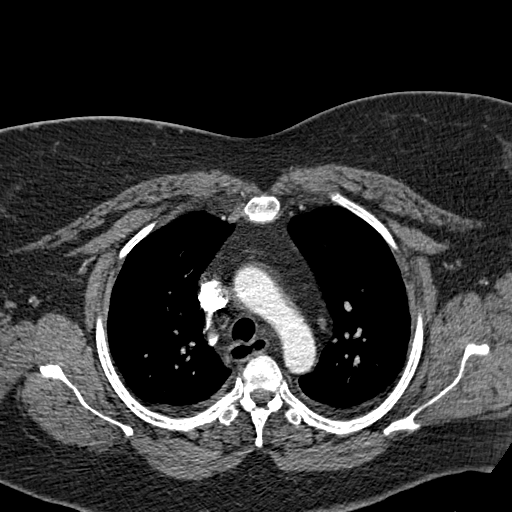
[im 235/301  lung]
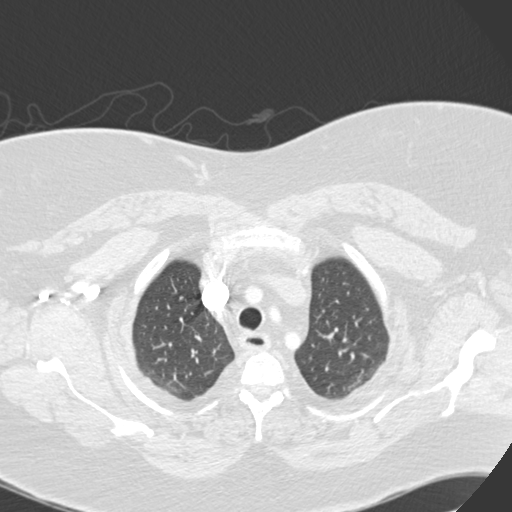
[im 248/301  soft-tissue]
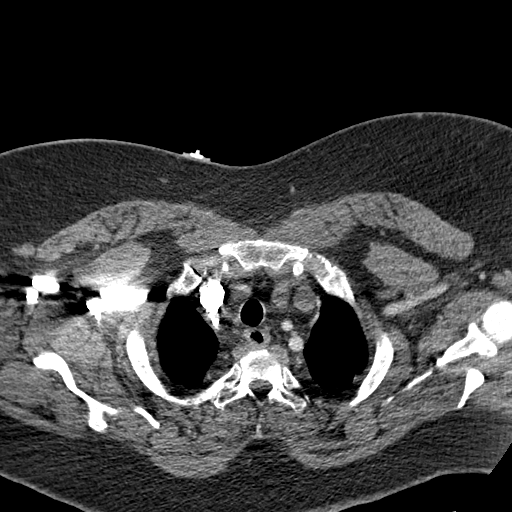
[im 261/301  lung]
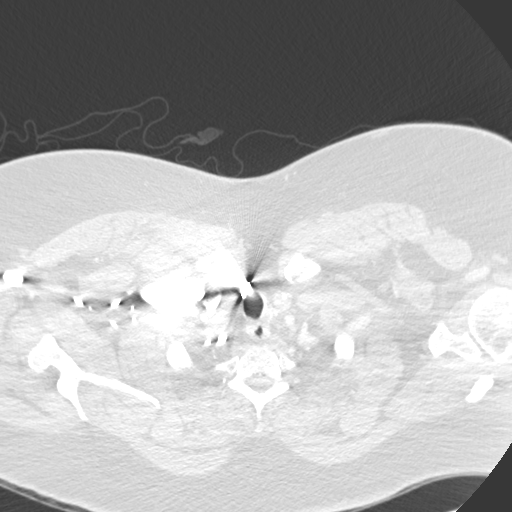
[im 287/301  soft-tissue]
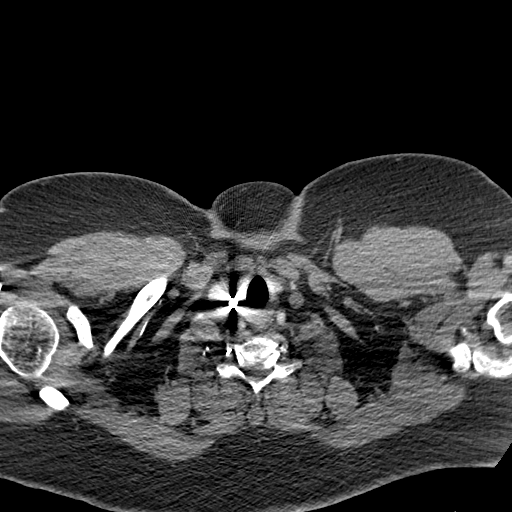

[Series 8: coronals · coronal · 0.64mm/px · 3 of 143 slices shown]
[im 36/143  soft-tissue]
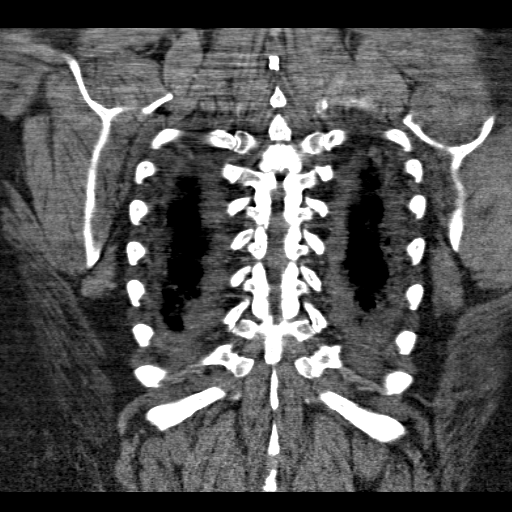
[im 72/143  soft-tissue]
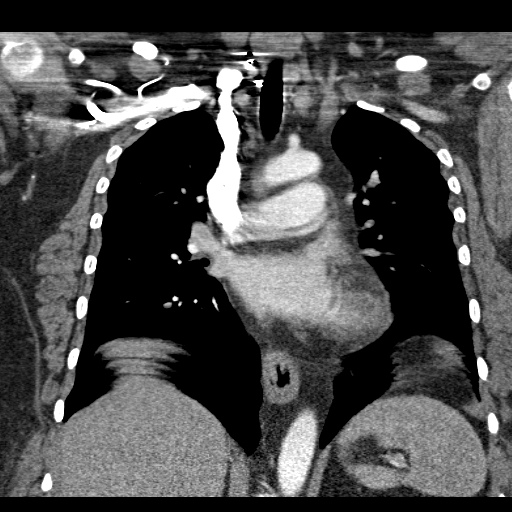
[im 107/143  soft-tissue]
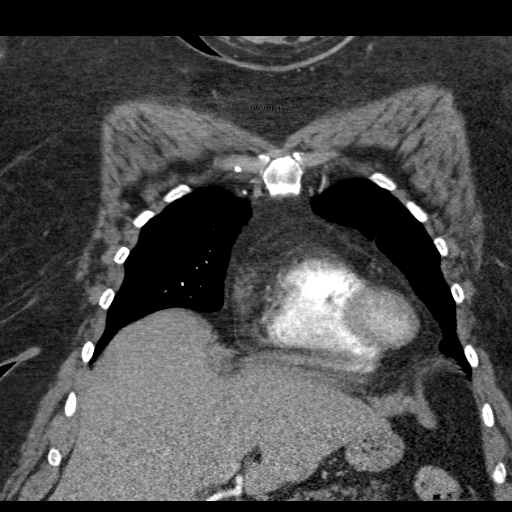

[19 of 46 positions shown; findings below may reference images not displayed]

FINDINGS: There is no evidence of significant pulmonary embolus.
Evaluation for pulmonary embolus is suboptimal due to motion
artifact.

Trace bilateral pleural effusions are noted, with mild bilateral
dependent subsegmental atelectasis.  No definite pulmonary edema is
seen.  There is no evidence of significant focal consolidation or
pneumothorax.  No masses are identified; no abnormal focal contrast
enhancement is seen.

The mediastinum is unremarkable in appearance.  No mediastinal
lymphadenopathy is seen.  No pericardial effusion identified.  The
great vessels are unremarkable in appearance.  Postoperative change
is noted about the thyroid bed; there is a 2.0 cm cystic focus
arising inferior to the left thyroid lobe.  A tiny hiatal hernia is
seen.  No axillary lymphadenopathy is seen.

The visualized portions of the liver and spleen are unremarkable.

No acute osseous abnormalities are seen.
IMPRESSION: 1.  No evidence of significant pulmonary embolus.
2.  Trace bilateral pleural effusions, with mild bilateral
dependent subsegmental atelectasis.  No definite pulmonary edema
seen.
3.  Tiny hiatal hernia noted.
4.  2.0 cm cystic lesion arising inferior to the left thyroid lobe;
postoperative change noted about the right thyroid bed.  Consider
further evaluation with thyroid ultrasound.  If patient is
clinically hyperthyroid, consider nuclear medicine thyroid uptake
and scan.

## 2012-11-30 ENCOUNTER — Encounter: Payer: Medicare Other | Admitting: Physical Therapy

## 2012-11-30 IMAGING — MR MR HEAD W/O CM
6 of 8 series · 29 of 48 positions shown · non-contrast
Comparison: Head CT 06/03/2011.

CLINICAL DATA: 56-year-old female with vertigo, dizziness, cardiac
arrhythmia, syncope.

MRI HEAD WITHOUT CONTRAST
TECHNIQUE: Multiplanar, multiecho pulse sequences of the brain and
surrounding structures were obtained according to standard protocol
without intravenous contrast.

[Series 3: T1 · sagittal · 5.0mm · 0.47mm/px · 2 of 12 slices shown]
[im 1/12]
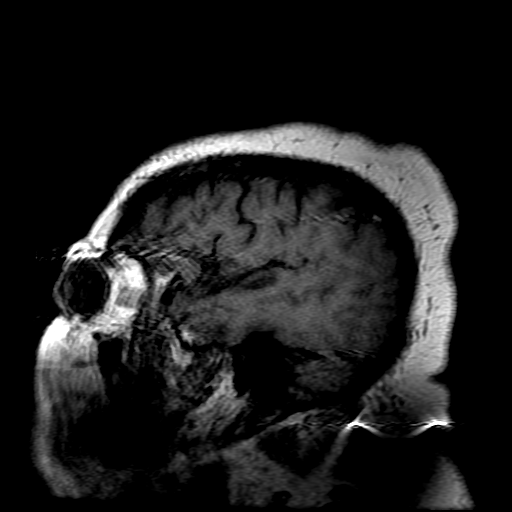
[im 6/12]
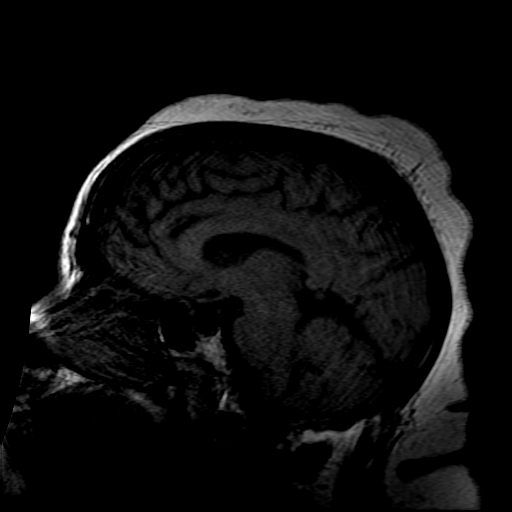

[Series 4: DWI · axial · 5.0mm · 1.09mm/px · z∈[-111,+36]mm · 9 of 56 slices shown (1 of 2)]
[im 1/56]
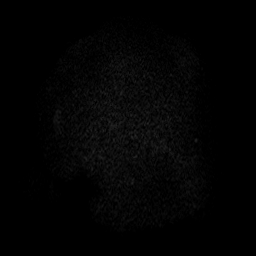
[im 11/56]
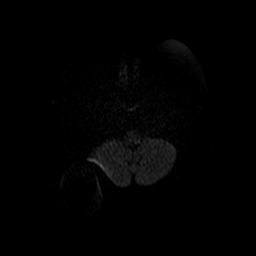
[im 16/56]
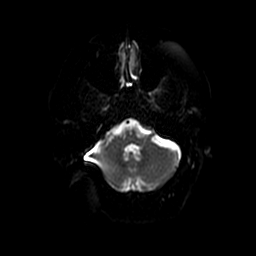
[im 26/56]
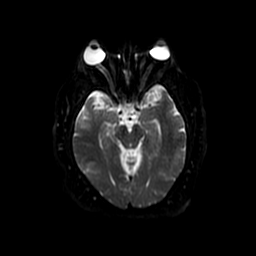
[im 31/56]
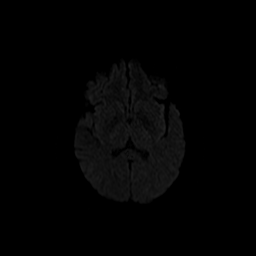
[im 41/56]
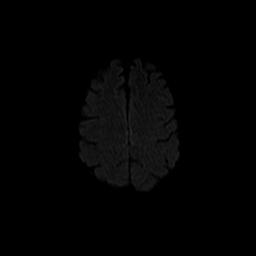
[im 46/56]
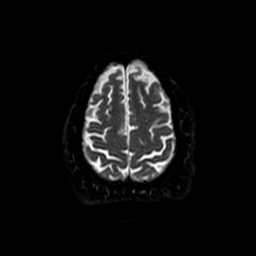
[im 51/56]
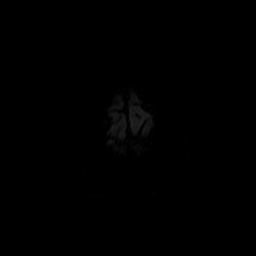
[im 56/56]
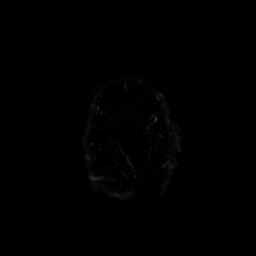

[Series 5: T2 · axial · 5.0mm · 0.47mm/px · z∈[-110,+36]mm · 4 of 22 slices shown (1 of 2)]
[im 1/22]
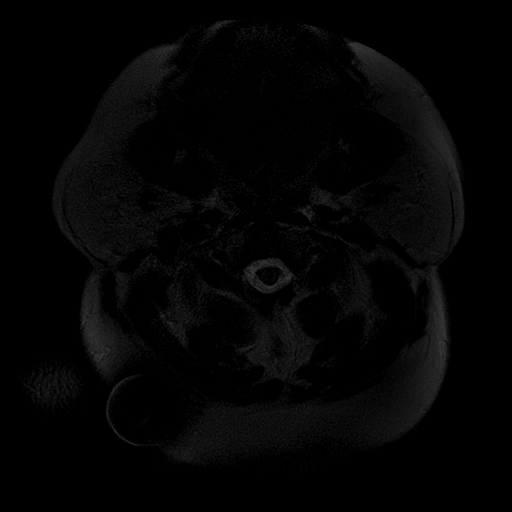
[im 8/22]
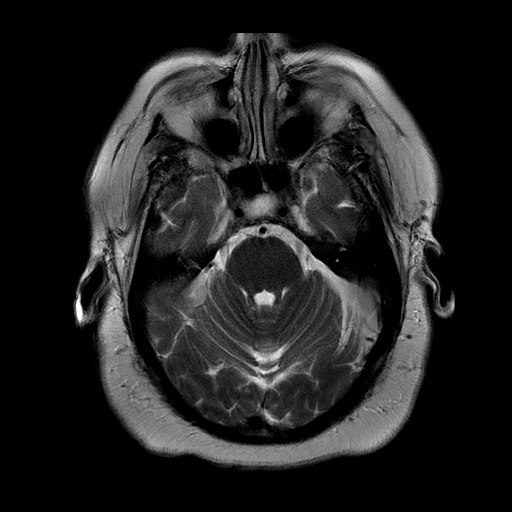
[im 15/22]
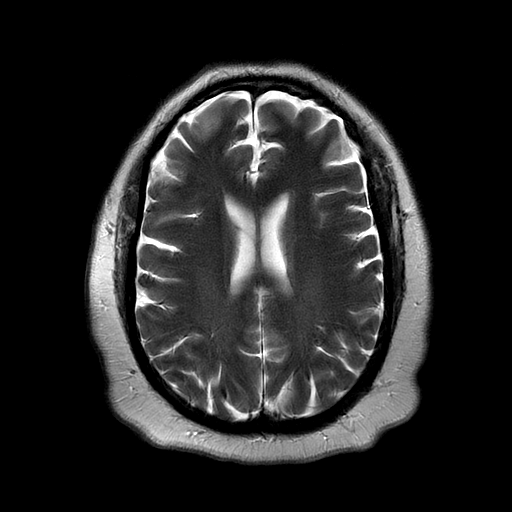
[im 22/22]
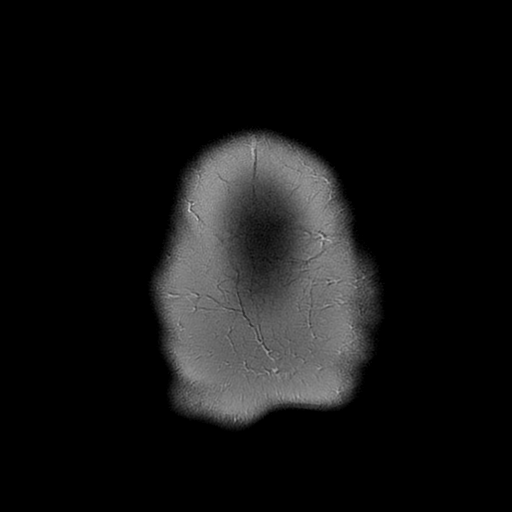

[Series 6: FLAIR · axial · 5.0mm · 0.47mm/px · z∈[-110,+36]mm · 4 of 22 slices shown]
[im 1/22]
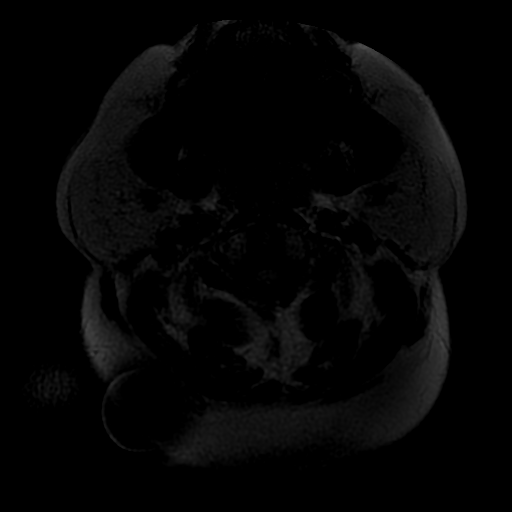
[im 8/22]
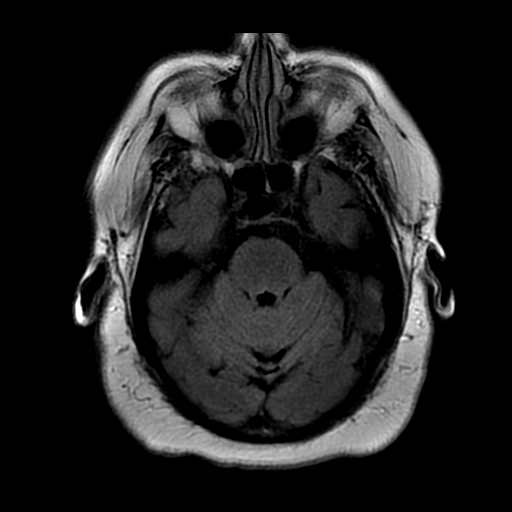
[im 15/22]
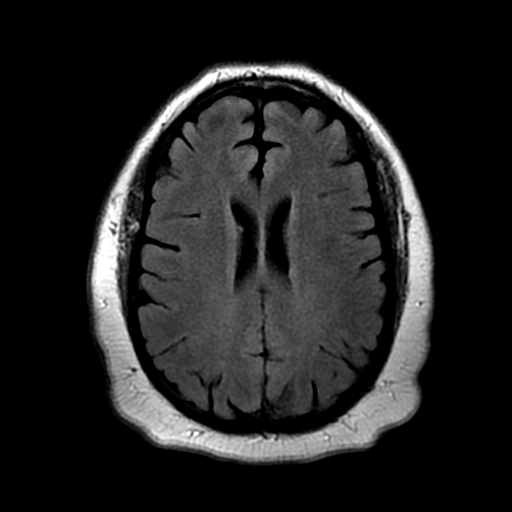
[im 22/22]
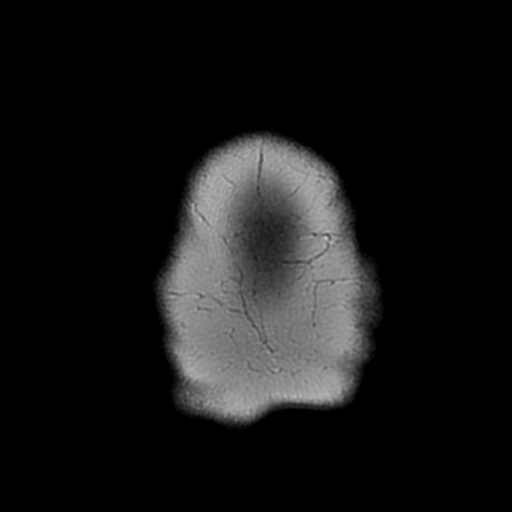

[Series 9: T2 · coronal · 5.0mm · 0.39mm/px · 5 of 26 slices shown (2 of 2)]
[im 1/26]
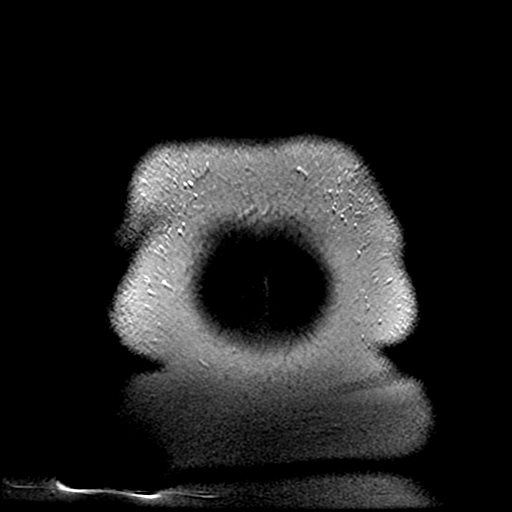
[im 7/26]
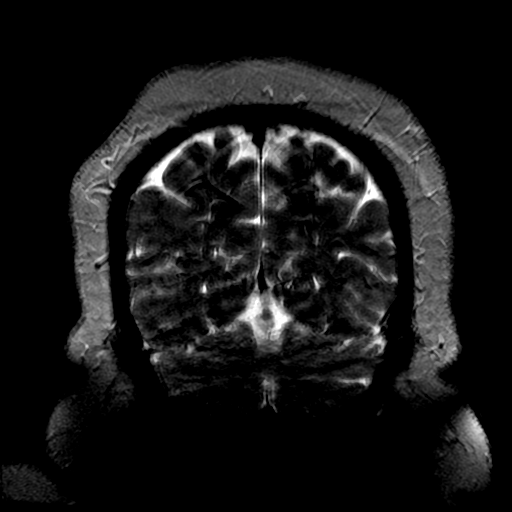
[im 13/26]
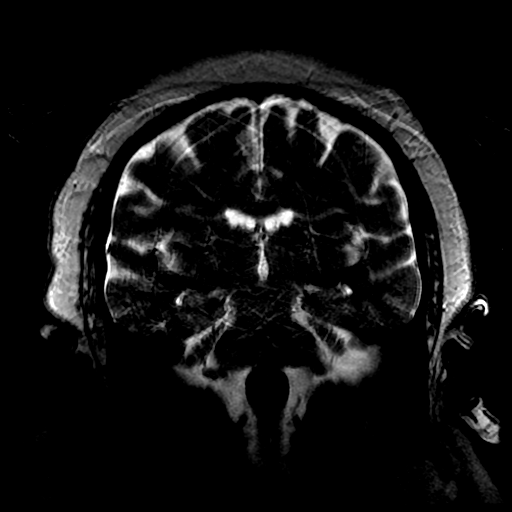
[im 19/26]
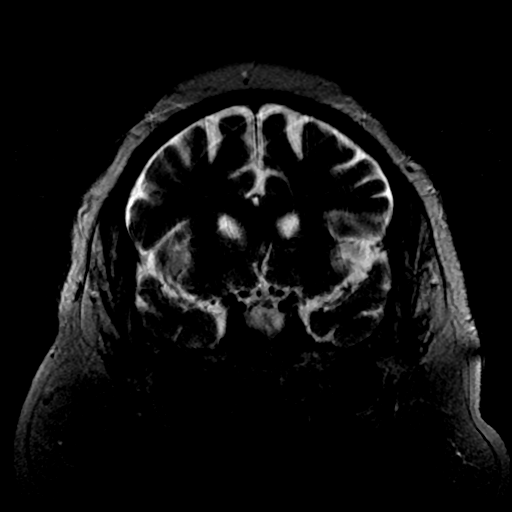
[im 26/26]
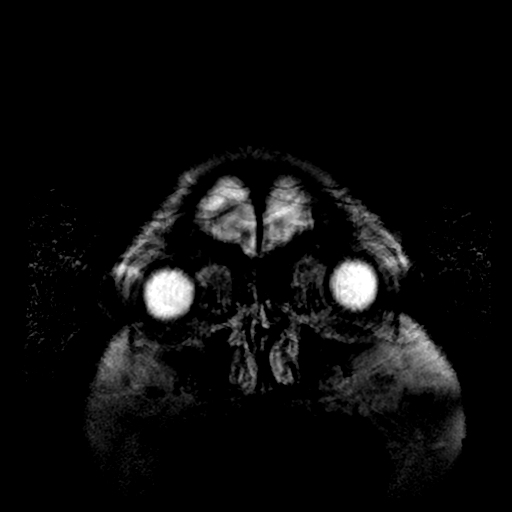

[Series 400: DWI · axial · 5.0mm · 1.09mm/px · z∈[-111,+36]mm · 5 of 28 slices shown (2 of 2)]
[im 1/28]
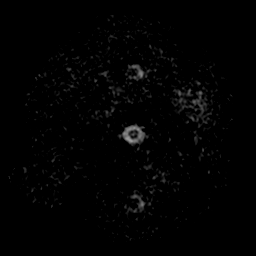
[im 7/28]
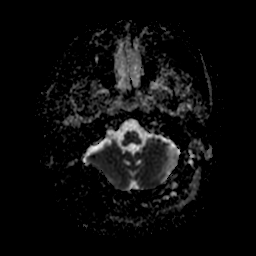
[im 14/28]
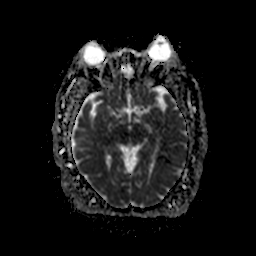
[im 21/28]
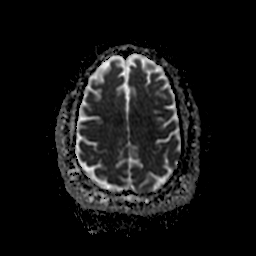
[im 28/28]
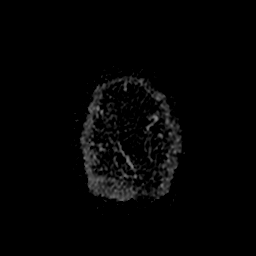

[29 of 48 positions shown; findings below may reference images not displayed]

FINDINGS: Partially empty sella.  Mild flattening of the posterior
globes.  Mild ectasia of the optic nerve sleeves containing CSF.

No restricted diffusion to suggest acute infarction.  No midline
shift, mass effect, evidence of mass lesion, ventriculomegaly,
extra-axial collection or acute intracranial hemorrhage.
Cervicomedullary junction is within normal limits.  Major
intracranial vascular flow voids are preserved. , Gray and white
matter signal is within normal limits for age throughout the brain.
Grossly negative visualized cervical spine.  Grossly normal
visualized internal auditory structures.

Hyperostosis of the calvarium, otherwise bone marrow signal is
within normal limits. Visualized paranasal sinuses and mastoids are
clear.  Large body habitus.  Negative scalp soft tissues.
IMPRESSION: 1. No acute intracranial abnormality.
2.  In the appropriate clinical setting, idiopathic intracranial
hypertension could not be excluded.

## 2012-12-02 ENCOUNTER — Encounter: Payer: Medicare Other | Admitting: Physical Therapy

## 2012-12-06 ENCOUNTER — Ambulatory Visit: Payer: Medicare Other | Admitting: Physical Therapy

## 2012-12-08 ENCOUNTER — Ambulatory Visit: Payer: Medicare Other

## 2012-12-13 ENCOUNTER — Ambulatory Visit: Payer: Medicare Other | Admitting: Physical Therapy

## 2012-12-27 ENCOUNTER — Ambulatory Visit: Payer: Medicare Other | Admitting: Physical Therapy

## 2012-12-29 ENCOUNTER — Ambulatory Visit: Payer: Medicare Other | Attending: Radiation Oncology

## 2012-12-29 ENCOUNTER — Encounter: Payer: Self-pay | Admitting: Neurology

## 2012-12-29 DIAGNOSIS — I890 Lymphedema, not elsewhere classified: Secondary | ICD-10-CM | POA: Insufficient documentation

## 2012-12-29 DIAGNOSIS — IMO0001 Reserved for inherently not codable concepts without codable children: Secondary | ICD-10-CM | POA: Insufficient documentation

## 2012-12-30 ENCOUNTER — Encounter: Payer: Self-pay | Admitting: Neurology

## 2012-12-30 ENCOUNTER — Ambulatory Visit (INDEPENDENT_AMBULATORY_CARE_PROVIDER_SITE_OTHER): Payer: Medicare Other | Admitting: Neurology

## 2012-12-30 VITALS — BP 141/84 | HR 99 | Ht 65.0 in | Wt 321.0 lb

## 2012-12-30 DIAGNOSIS — H8110 Benign paroxysmal vertigo, unspecified ear: Secondary | ICD-10-CM

## 2012-12-30 DIAGNOSIS — G219 Secondary parkinsonism, unspecified: Secondary | ICD-10-CM

## 2012-12-30 DIAGNOSIS — R471 Dysarthria and anarthria: Secondary | ICD-10-CM | POA: Insufficient documentation

## 2012-12-30 HISTORY — DX: Secondary parkinsonism, unspecified: G21.9

## 2012-12-30 HISTORY — DX: Benign paroxysmal vertigo, unspecified ear: H81.10

## 2012-12-30 NOTE — Progress Notes (Signed)
Reason for visit: Speech disturbance  [name] is a 56 y.o. female  History of present illness:  [name] is a 57 year old right-handed black female with a history of diabetes, morbid obesity, depression and anxiety, hypertension, and dyslipidemia. The patient indicates that 2 months ago, she had a sudden onset of problems with slurred speech. The patient indicates that this has been persistent since that time. The patient also has had a gradual change in her ability to ambulate, and perform activities of daily living. The patient is using a cane for ambulation, and she indicates that this has been the case for 4 or 5 years. The patient indicates that her activities of daily living are slowing down. The patient is on Abilify. The patient denies any blackout episodes or confusion, but she does have a history of benign positional vertigo. The vertigo will come on when she lies down, or sits up. The patient has had headaches that are in the left frontotemporal area. The patient indicates that the headaches have been a problem for about a year. The patient has a history of sleep apnea, but she is not on CPAP at this time, but she does have oxygen at night. The patient reports some numbness in the hands and feet over the last several months. The patient feels "weak all over". The patient is sent to this office for an evaluation.  Past Medical History  Diagnosis Date  . Depression   . Thyroid disease   . GERD (gastroesophageal reflux disease)   . Pancreatitis     hx of  . Knee pain, bilateral   . Obesity   . HSV (herpes simplex virus) infection   . Hyperlipemia   . Asthma   . Tachycardia     with sx in 2008  . Breast mass in female     L breast 2008, US showed likely fat necrosis  . Arthritis   . Anxiety   . Heartburn   . Hypertension     sees Dr. Criss Rosales , [facility]  . Hx of radiation therapy 05/05/12- 07/15/12    right breast, 45 gray x 25 fx, lumpectomy cavity boosted to  16.2 gray  . Sleep apnea     uses cpap, pt does not know settings  . History of kidney stones   . Headache(784.0)   . 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  . Anemia   . History of blood transfusion 2011  . Hypothyroidism   . Elevated glucose     pt denies diabetes glucose elevated on cmet glucose  08-03-2012  . 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 Polyneuropathy in diabetes(357.2)   . Benign paroxysmal positional vertigo 12/30/2012    Past Surgical History  Procedure Laterality Date  . Replacement total knee bilateral Bilateral yrs ago  . Foot surgery Right 10 yrs ago  . Abdominal surgery    . Thyroid surgery  yrs ago  . Joint replacement    . Abdominal hysterectomy  yrs ago    partial  . Breast surgery Right   . Breast lumpectomy  03/03/2012    Procedure: LUMPECTOMY;  Surgeon: Haywood Lasso, MD;  Location: [facility];  Service: General;  Laterality: Right;  . Cholecystectomy  yrs ago  . Colonoscopy with propofol N/A 09/28/2012    Procedure: COLONOSCOPY WITH PROPOFOL;  Surgeon: Lear Ng, MD;  Location: [facility];  Service: Endoscopy;  Laterality: N/A;  Family History  Problem Relation Age of Onset  . Heart disease Brother   . Diabetes Brother   . Hypertension Mother   . Diabetes Mother   . Breast cancer Mother 39  . Bone cancer Mother   . Hypertension Sister   . Diabetes Sister   . Breast cancer Sister 65  . Breast cancer Maternal Grandmother   . Heart disease Maternal Grandmother   . Uterine cancer Other 19  . Breast cancer Paternal Aunt 71  . Breast cancer Paternal Grandmother     dx in her 60s  . Prostate cancer Paternal Grandfather     Social history:  reports that she quit smoking about 20 years ago. Her smoking use included Cigarettes. She has a 7 pack-year smoking history. She has never used smokeless tobacco. She reports that she does not drink alcohol or use illicit drugs.  Medications:   Current Outpatient Prescriptions on File Prior to Visit  Medication Sig Dispense Refill  . anastrozole (ARIMIDEX) 1 MG tablet Take 1 mg by mouth at bedtime.      . ARIPiprazole (ABILIFY) 10 MG tablet Take 10 mg by mouth every morning.       [name] atorvastatin (LIPITOR) 80 MG tablet Take 80 mg by mouth daily.      . budesonide-formoterol (SYMBICORT) 80-4.5 MCG/ACT inhaler Inhale 1-2 puffs into the lungs daily as needed. For shortness of breath      . CALCIUM PO Take 2 tablets by mouth daily.      . cholecalciferol (VITAMIN D) 1000 UNITS tablet Take 1 tablet (1,000 Units total) by mouth daily.  30 tablet  12  . desonide (DESOWEN) 0.05 % ointment Apply 1 application topically 2 (two) times daily.       [name] esomeprazole (NEXIUM) 40 MG capsule Take 40 mg by mouth daily before breakfast.      . fluticasone (CUTIVATE) 0.05 % cream Apply 1 application topically 2 (two) times daily as needed (rash).       . gabapentin (NEURONTIN) 100 MG capsule Take 100 mg by mouth daily.      . hyaluronate sodium (RADIAPLEXRX) GEL Apply 1 application topically 2 (two) times daily.      . hydrochlorothiazide (HYDRODIURIL) 25 MG tablet Take 1 tablet (25 mg total) by mouth daily.  30 tablet  0  . HYDROcodone-acetaminophen (NORCO/VICODIN) 5-325 MG per tablet Take 1-2 tablets by mouth every 4 (four) hours as needed for pain.  60 tablet  0  . JANUMET 50-500 MG per tablet Take 50-500 tablets by mouth 2 (two) times daily with a meal.       . LEVEMIR FLEXTOUCH 100 UNIT/ML SOPN Inject 100 mLs into the muscle at bedtime.      [name] levothyroxine (SYNTHROID, LEVOTHROID) 200 MCG tablet Take 300 mcg by mouth daily before breakfast.       . lisinopril (PRINIVIL,ZESTRIL) 2.5 MG tablet Take 2.5 mg by mouth daily.      [name] LORazepam (ATIVAN) 1 MG tablet Take 1 mg by mouth every 8 (eight) hours as needed. For anxiety      . meloxicam (MOBIC) 15 MG tablet Take 15 mg by mouth every morning.       . Multiple Vitamin (MULTIVITAMIN WITH MINERALS) TABS  Take 1 tablet by mouth daily.      . non-metallic deodorant Jethro Poling) MISC Apply 1 application topically daily as needed (for deodorant/antiperspirant.).       [name] NOVOFINE 32G X 6 MM MISC Inject 32 g into  the muscle daily.      Glory Rosebush DELICA LANCETS 62V MISC Inject 33 g into the muscle 3 (three) times daily.      Glory Rosebush VERIO test strip 100 each by Other route 3 (three) times daily.      [name] PARoxetine (PAXIL) 20 MG tablet Take 30 mg by mouth every morning.      . pravastatin (PRAVACHOL) 40 MG tablet Take 80 mg by mouth at bedtime.      . silver sulfADIAZINE (SILVADENE) 1 % cream Apply 1 application topically daily.      . traZODone (DESYREL) 100 MG tablet Take 100 mg by mouth at bedtime.      . valACYclovir (VALTREX) 1000 MG tablet Take 1,000 mg by mouth daily.      . VOLTAREN 1 % GEL Apply 2 g topically 2 (two) times daily.       [name] zolpidem (AMBIEN) 10 MG tablet Take 10 mg by mouth at bedtime as needed. For sleep      . metFORMIN (GLUCOPHAGE) 500 MG tablet Take 1 tablet (500 mg total) by mouth 2 (two) times daily with a meal.  20 tablet  0   No current facility-administered medications on file prior to visit.      Allergies  Allergen Reactions  . Shellfish Allergy Shortness Of Breath  . Bee Venom Hives  . Morphine And Related Other (See Comments)    Overly sedated  . Oxycodone Hcl     REACTION: hives    ROS:  Out of a complete 14 system review of symptoms, the patient complains only of the following symptoms, and all other reviewed systems are negative.  Chills, weight gain, fatigue Ringing in the ears, vertigo Birthmarks Blurred vision Shortness of breath, cough, wheezing, snoring Blood in stool, diarrhea Feeling hot, increased thirst Joint pain, joint swelling, achy muscles Allergies, runny nose Memory loss, confusion, headache, numbness, weakness, tremor Depression, anxiety, not enough sleep, decreased energy, change in appetite, disinterest in activities, racing  thoughts  Blood pressure 141/84, pulse 99, height _0  (1.651 m), weight 321 lb (145.605 kg).  Physical Exam  General: The patient is alert and cooperative at the time of the examination.  Head: Pupils are equal, round, and reactive to light. Discs are flat bilaterally.  Neck: The neck is supple, no carotid bruits are noted.  Respiratory: The respiratory examination is clear.  Cardiovascular: The cardiovascular examination reveals a regular rate and rhythm, no obvious murmurs or rubs are noted.  Skin: Extremities are with 2-3+ edema below the knees bilaterally.  Neurologic Exam  Mental status:  Cranial nerves: Facial symmetry is present. There is good sensation of the face to pinprick and soft touch bilaterally. The strength of the facial muscles and the muscles to head turning and shoulder shrug are normal bilaterally. Speech is well enunciated, no aphasia or dysarthria is noted. Extraocular movements are full. Visual fields are full.  Motor: The motor testing reveals 5 over 5 strength of all 4 extremities. Good symmetric motor tone is noted throughout.  Sensory: Sensory testing is intact to pinprick, soft touch, vibration sensation, and position sense on all 4 extremities. No evidence of extinction is noted.  Coordination: Cerebellar testing reveals good finger-nose-finger and heel-to-shin bilaterally.  Gait and station: Gait is normal. Tandem gait is normal. Romberg is negative. No drift is seen.  Reflexes: Deep tendon reflexes are symmetric and normal bilaterally. Toes are downgoing bilaterally.   Assessment/Plan:  1. Positional vertigo  2. Secondary  parkinsonism  3. Reported slurred speech  The patient appears to be developing secondary parkinsonism, with masked face, slowness of movement. I do not perceive any particular speech problems with dysarthria or aphasia. The patient however, believes that there was a sudden change in her speech pattern. The patient has  multiple risk factors for stroke, and I will check a MRI of the brain, MRA of the head. A carotid Doppler will be done. The patient will followup in 3 months. The patient is on low-dose aspirin. The patient has positional vertigo, I will send her for vestibular rehabilitation.  Jill Alexanders MD 12/30/2012 7:12 PM  [facility] [address] [place], [place] [address]  Phone [phone] Fax [phone]

## 2012-12-30 NOTE — Patient Instructions (Signed)
Stroke Prevention Some medical conditions and behaviors are associated with an increased chance of having a stroke. You may prevent a stroke by making healthy choices and managing medical conditions. Reduce your risk of having a stroke by:  Staying physically active. Get at least 30 minutes of activity on most or all days.  Not smoking. It may also be helpful to avoid exposure to secondhand smoke.  Limiting alcohol use. Moderate alcohol use is considered to be:  No more than 2 drinks per day for men.  No more than 1 drink per day for nonpregnant women.  Eating healthy foods.  Include 5 or more servings of fruits and vegetables a day.  Certain diets may be prescribed to address high blood pressure, high cholesterol, diabetes, or obesity.  Managing your cholesterol levels.  A low-saturated fat, low-trans fat, low-cholesterol, and high-fiber diet may control cholesterol levels.  Take any prescribed medicines to control cholesterol as directed by your caregiver.  Managing your diabetes.  A controlled-carbohydrate, controlled-sugar diet is recommended to manage diabetes.  Take any prescribed medicines to control diabetes as directed by your caregiver.  Controlling your high blood pressure (hypertension).  A low-salt (sodium), low-saturated fat, low-trans fat, and low-cholesterol diet is recommended to manage high blood pressure.  Take any prescribed medicines to control hypertension as directed by your caregiver.  Maintaining a healthy weight.  A reduced-calorie, low-sodium, low-saturated fat, low-trans fat, low-cholesterol diet is recommended to manage weight.  Stopping drug abuse.  Avoiding birth control pills.  Talk to your caregiver about the risks of taking birth control pills if you are over 65 years old, smoke, get migraines, or have ever had a blood clot.  Getting evaluated for sleep disorders (sleep apnea).  Talk to your caregiver about getting a sleep evaluation  if you snore a lot or have excessive sleepiness.  Taking medicines as directed by your caregiver.  For some people, aspirin or blood thinners (anticoagulants) are helpful in reducing the risk of forming abnormal blood clots that can lead to stroke. If you have the irregular heart rhythm of atrial fibrillation, you should be on a blood thinner unless there is a good reason you cannot take them.  Understand all your medicine instructions. SEEK IMMEDIATE MEDICAL CARE IF:   You have sudden weakness or numbness of the face, arm, or leg, especially on one side of the body.  You have sudden confusion.  You have trouble speaking (aphasia) or understanding.  You have sudden trouble seeing in one or both eyes.  You have sudden trouble walking.  You have dizziness.  You have a loss of balance or coordination.  You have a sudden, severe headache with no known cause.  You have new chest pain or an irregular heartbeat. Any of these symptoms may represent a serious problem that is an emergency. Do not wait to see if the symptoms will go away. Get medical help right away. Call your local emergency services (911 in [place] Do not drive yourself to the hospital. Document Released: 03/13/2004 Document Revised: 04/28/2011 Document Reviewed: 08/06/2012 [facility] Patient Information 2014 [place].

## 2013-01-04 ENCOUNTER — Ambulatory Visit: Payer: Medicare Other

## 2013-01-07 ENCOUNTER — Ambulatory Visit: Payer: Medicare Other | Admitting: Physical Therapy

## 2013-01-15 ENCOUNTER — Other Ambulatory Visit: Payer: Medicare Other

## 2013-01-15 ENCOUNTER — Inpatient Hospital Stay: Admission: RE | Admit: 2013-01-15 | Payer: Medicare Other | Source: Ambulatory Visit

## 2013-01-18 ENCOUNTER — Ambulatory Visit: Payer: Medicare Other | Attending: Neurology | Admitting: Physical Therapy

## 2013-01-18 DIAGNOSIS — I890 Lymphedema, not elsewhere classified: Secondary | ICD-10-CM | POA: Insufficient documentation

## 2013-01-18 DIAGNOSIS — IMO0001 Reserved for inherently not codable concepts without codable children: Secondary | ICD-10-CM | POA: Insufficient documentation

## 2013-01-20 ENCOUNTER — Encounter: Payer: Medicare Other | Admitting: Physical Therapy

## 2013-01-26 ENCOUNTER — Encounter (INDEPENDENT_AMBULATORY_CARE_PROVIDER_SITE_OTHER): Payer: Self-pay | Admitting: Surgery

## 2013-01-26 ENCOUNTER — Ambulatory Visit (INDEPENDENT_AMBULATORY_CARE_PROVIDER_SITE_OTHER): Payer: Medicare Other | Admitting: Surgery

## 2013-01-26 ENCOUNTER — Other Ambulatory Visit: Payer: Medicare Other

## 2013-01-26 VITALS — BP 110/80 | HR 108 | Resp 18 | Ht 65.0 in | Wt 325.2 lb

## 2013-01-26 DIAGNOSIS — Z853 Personal history of malignant neoplasm of breast: Secondary | ICD-10-CM

## 2013-01-26 NOTE — Patient Instructions (Signed)
Continued annual mammograms and followups in this office

## 2013-01-26 NOTE — Progress Notes (Signed)
NAME: [name]       DOB: July 23, 1955           DATE: 01/26/2013       MRN: [id number]  CC:   Chief Complaint  Patient presents with  . 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 She had concerns about the right axilla and UOQ at last visit, but apparently had some lymphedema type fluid, now resolved PFSH: She has had no significant changes since the last visit here.  ROS: There have been no significant changes since the last visit here  EXAM:  VS: BP 110/80  Pulse 108  Resp 18  Ht 5' 5" (1.651 m)  Wt 325 lb 3.2 oz (147.51 kg)  BMI 54.12 kg/m2  General: The patient is alert, oriented, generally healthy appearing, NAD. Mood and affect are normal.  Breasts:  Large, pendulous breasts, some radiation changes noted on Right, right is tender throughout,Left in unremarkable. The lumpectomy site feels like normal breast tissue.   Lymphatics: She has no axillary or supraclavicular adenopathy on either side.  Extremities: Full ROM of the surgical side with no lymphedema noted.  Data Reviewed: Right mammogram 10/2012: Clinical Data: History of right lumpectomy with radiation therapy  in December 2013. The patient reports diffuse right breast pain.  DIGITAL DIAGNOSTIC RIGHT MAMMOGRAM WITH CAD  Comparison: 01/20/2012 and earlier  Findings:  ACR Breast Density Category b: There are scattered areas of  fibroglandular density.  Postoperative changes are identified in the upper-outer quadrant of  the right breast. No suspicious mass, distortion, or  microcalcifications are identified to suggest presence of  malignancy.  Mammographic images were processed with CAD.  IMPRESSION:  No mammographic evidence for malignancy.  RECOMMENDATION:  Diagnostic mammogram is suggested in December 2014.  I have discussed  the findings and recommendations with the patient.  Results were also provided in writing at the conclusion of the  visit. If applicable, a reminder letter will be sent to the  patient regarding her next appointment.  BI-RADS CATEGORY 2: Benign finding(s).  Original Report Authenticated By: Nolon Nations, M.D.   Impression: Doing well, no evidence of recurrence  Plan: Continue annual mammograms and followup to this office

## 2013-01-27 ENCOUNTER — Encounter: Payer: Medicare Other | Admitting: Physical Therapy

## 2013-01-29 ENCOUNTER — Other Ambulatory Visit: Payer: Medicare Other

## 2013-01-29 ENCOUNTER — Inpatient Hospital Stay: Admission: RE | Admit: 2013-01-29 | Payer: Medicare Other | Source: Ambulatory Visit

## 2013-02-01 ENCOUNTER — Other Ambulatory Visit: Payer: Medicare Other

## 2013-02-03 ENCOUNTER — Encounter: Payer: Medicare Other | Admitting: Physical Therapy

## 2013-02-07 ENCOUNTER — Ambulatory Visit: Payer: Medicare Other | Admitting: Radiation Oncology

## 2013-02-08 ENCOUNTER — Other Ambulatory Visit: Payer: Self-pay | Admitting: Physician Assistant

## 2013-02-09 ENCOUNTER — Telehealth: Payer: Self-pay | Admitting: *Deleted

## 2013-02-09 NOTE — Telephone Encounter (Signed)
Lm informed the pt that [facility] will be out on pal 03/21/13. gv appt for 03/22/13 w/ labs@ 2p and ov@ 2:30pm. Pt is aware...td

## 2013-02-13 ENCOUNTER — Inpatient Hospital Stay: Admission: RE | Admit: 2013-02-13 | Payer: Medicare Other | Source: Ambulatory Visit

## 2013-02-13 ENCOUNTER — Other Ambulatory Visit: Payer: Medicare Other

## 2013-02-18 ENCOUNTER — Encounter (HOSPITAL_COMMUNITY): Payer: Self-pay | Admitting: Emergency Medicine

## 2013-02-18 ENCOUNTER — Emergency Department (HOSPITAL_COMMUNITY)
Admission: EM | Admit: 2013-02-18 | Discharge: 2013-02-19 | Disposition: A | Discharge status: Home / Self Care | Payer: Medicare Other | Attending: Emergency Medicine | Admitting: Emergency Medicine

## 2013-02-18 ENCOUNTER — Emergency Department (INDEPENDENT_AMBULATORY_CARE_PROVIDER_SITE_OTHER)
Admission: EM | Admit: 2013-02-18 | Discharge: 2013-02-18 | Disposition: A | Discharge status: Nursing Home (Includes ICF) | Payer: Medicare Other | Source: Home / Self Care | Attending: Family Medicine | Admitting: Family Medicine

## 2013-02-18 ENCOUNTER — Emergency Department (INDEPENDENT_AMBULATORY_CARE_PROVIDER_SITE_OTHER): Payer: Medicare Other

## 2013-02-18 DIAGNOSIS — E1142 Type 2 diabetes mellitus with diabetic polyneuropathy: Secondary | ICD-10-CM | POA: Insufficient documentation

## 2013-02-18 DIAGNOSIS — E663 Overweight: Secondary | ICD-10-CM | POA: Insufficient documentation

## 2013-02-18 DIAGNOSIS — R0602 Shortness of breath: Secondary | ICD-10-CM

## 2013-02-18 DIAGNOSIS — Z8742 Personal history of other diseases of the female genital tract: Secondary | ICD-10-CM | POA: Insufficient documentation

## 2013-02-18 DIAGNOSIS — Z862 Personal history of diseases of the blood and blood-forming organs and certain disorders involving the immune mechanism: Secondary | ICD-10-CM | POA: Insufficient documentation

## 2013-02-18 DIAGNOSIS — M129 Arthropathy, unspecified: Secondary | ICD-10-CM | POA: Insufficient documentation

## 2013-02-18 DIAGNOSIS — Z87442 Personal history of urinary calculi: Secondary | ICD-10-CM | POA: Insufficient documentation

## 2013-02-18 DIAGNOSIS — E039 Hypothyroidism, unspecified: Secondary | ICD-10-CM | POA: Insufficient documentation

## 2013-02-18 DIAGNOSIS — E785 Hyperlipidemia, unspecified: Secondary | ICD-10-CM | POA: Insufficient documentation

## 2013-02-18 DIAGNOSIS — Z8739 Personal history of other diseases of the musculoskeletal system and connective tissue: Secondary | ICD-10-CM | POA: Insufficient documentation

## 2013-02-18 DIAGNOSIS — R071 Chest pain on breathing: Secondary | ICD-10-CM

## 2013-02-18 DIAGNOSIS — Z79899 Other long term (current) drug therapy: Secondary | ICD-10-CM | POA: Insufficient documentation

## 2013-02-18 DIAGNOSIS — I10 Essential (primary) hypertension: Secondary | ICD-10-CM | POA: Insufficient documentation

## 2013-02-18 DIAGNOSIS — J45901 Unspecified asthma with (acute) exacerbation: Secondary | ICD-10-CM | POA: Insufficient documentation

## 2013-02-18 DIAGNOSIS — R609 Edema, unspecified: Secondary | ICD-10-CM | POA: Insufficient documentation

## 2013-02-18 DIAGNOSIS — F3289 Other specified depressive episodes: Secondary | ICD-10-CM | POA: Insufficient documentation

## 2013-02-18 DIAGNOSIS — J040 Acute laryngitis: Secondary | ICD-10-CM | POA: Insufficient documentation

## 2013-02-18 DIAGNOSIS — Z794 Long term (current) use of insulin: Secondary | ICD-10-CM | POA: Insufficient documentation

## 2013-02-18 DIAGNOSIS — IMO0002 Reserved for concepts with insufficient information to code with codable children: Secondary | ICD-10-CM | POA: Insufficient documentation

## 2013-02-18 DIAGNOSIS — F329 Major depressive disorder, single episode, unspecified: Secondary | ICD-10-CM | POA: Insufficient documentation

## 2013-02-18 DIAGNOSIS — Z853 Personal history of malignant neoplasm of breast: Secondary | ICD-10-CM | POA: Insufficient documentation

## 2013-02-18 DIAGNOSIS — R0781 Pleurodynia: Secondary | ICD-10-CM

## 2013-02-18 DIAGNOSIS — Z923 Personal history of irradiation: Secondary | ICD-10-CM | POA: Insufficient documentation

## 2013-02-18 DIAGNOSIS — J029 Acute pharyngitis, unspecified: Secondary | ICD-10-CM | POA: Insufficient documentation

## 2013-02-18 DIAGNOSIS — E1149 Type 2 diabetes mellitus with other diabetic neurological complication: Secondary | ICD-10-CM | POA: Insufficient documentation

## 2013-02-18 DIAGNOSIS — Z87891 Personal history of nicotine dependence: Secondary | ICD-10-CM | POA: Insufficient documentation

## 2013-02-18 DIAGNOSIS — Z7982 Long term (current) use of aspirin: Secondary | ICD-10-CM | POA: Insufficient documentation

## 2013-02-18 DIAGNOSIS — Z791 Long term (current) use of non-steroidal anti-inflammatories (NSAID): Secondary | ICD-10-CM | POA: Insufficient documentation

## 2013-02-18 DIAGNOSIS — F411 Generalized anxiety disorder: Secondary | ICD-10-CM | POA: Insufficient documentation

## 2013-02-18 DIAGNOSIS — Z8619 Personal history of other infectious and parasitic diseases: Secondary | ICD-10-CM | POA: Insufficient documentation

## 2013-02-18 DIAGNOSIS — K219 Gastro-esophageal reflux disease without esophagitis: Secondary | ICD-10-CM | POA: Insufficient documentation

## 2013-02-18 LAB — COMPREHENSIVE METABOLIC PANEL
ALBUMIN: 3.4 g/dL — AB (ref 3.5–5.2)
ALT: 12 U/L (ref 0–35)
AST: 8 U/L (ref 0–37)
Alkaline Phosphatase: 95 U/L (ref 39–117)
BUN: 22 mg/dL (ref 6–23)
CALCIUM: 9.5 mg/dL (ref 8.4–10.5)
CO2: 27 mEq/L (ref 19–32)
CREATININE: 1.14 mg/dL — AB (ref 0.50–1.10)
Chloride: 100 mEq/L (ref 96–112)
GFR calc Af Amer: 61 mL/min — ABNORMAL LOW (ref >90)
GFR calc non Af Amer: 53 mL/min — ABNORMAL LOW (ref >90)
Glucose, Bld: 135 mg/dL — ABNORMAL HIGH (ref 70–99)
Potassium: 4.2 mEq/L (ref 3.7–5.3)
SODIUM: 140 meq/L (ref 137–147)
TOTAL PROTEIN: 7 g/dL (ref 6.0–8.3)
Total Bilirubin: <0.2 mg/dL — ABNORMAL LOW (ref 0.3–1.2)

## 2013-02-18 LAB — CBC WITH DIFFERENTIAL/PLATELET
BASOS PCT: 0 % (ref 0–1)
Basophils Absolute: 0 10*3/uL (ref 0.0–0.1)
EOS ABS: 0.6 10*3/uL (ref 0.0–0.7)
EOS PCT: 6 % — AB (ref 0–5)
HCT: 31.4 % — ABNORMAL LOW (ref 36.0–46.0)
Hemoglobin: 10.2 g/dL — ABNORMAL LOW (ref 12.0–15.0)
LYMPHS PCT: 26 % (ref 12–46)
Lymphs Abs: 2.7 10*3/uL (ref 0.7–4.0)
MCH: 27.8 pg (ref 26.0–34.0)
MCHC: 32.5 g/dL (ref 30.0–36.0)
MCV: 85.6 fL (ref 78.0–100.0)
Monocytes Absolute: 0.5 10*3/uL (ref 0.1–1.0)
Monocytes Relative: 5 % (ref 3–12)
Neutro Abs: 6.6 10*3/uL (ref 1.7–7.7)
Neutrophils Relative %: 63 % (ref 43–77)
PLATELETS: 204 10*3/uL (ref 150–400)
RBC: 3.67 MIL/uL — ABNORMAL LOW (ref 3.87–5.11)
RDW: 16.7 % — ABNORMAL HIGH (ref 11.5–15.5)
WBC: 10.4 10*3/uL (ref 4.0–10.5)

## 2013-02-18 LAB — D-DIMER, QUANTITATIVE: D-Dimer, Quant: 1.38 ug/mL-FEU — ABNORMAL HIGH (ref 0.00–0.48)

## 2013-02-18 LAB — POCT I-STAT TROPONIN I: TROPONIN I, POC: 0 ng/mL (ref 0.00–0.08)

## 2013-02-18 NOTE — ED Notes (Signed)
C/o loss of voice.  Productive cough with yellow/dark green sputum.  Body aches and vomiting.   Onset 4 days ago.  No relief with otc meds.

## 2013-02-18 NOTE — ED Provider Notes (Signed)
CSN: [id number]     Arrival date & time 02/18/13  1551 History   First MD Initiated Contact with Patient 02/18/13 1824     Chief Complaint  Patient presents with  . Influenza  . Laryngitis   (Consider location/radiation/quality/duration/timing/severity/associated sxs/prior Treatment) HPI Comments: A 58-year-old female with a recent history of breast cancer as well as hypertension, diabetes, hypothyroidism, asthma, hyperlipidemia presents complaining of body aches, cough, pleuritic chest pain, shortness of breath, sore throat. These symptoms have all been present for 4 days and have been getting progressively worse. She rates the pleuritic chest pain as 8/10 in severity. She says the shortness of breath has been very bad and she has been having to use her nighttime CPAP machine during the daytime to remain comfortable. She has been coughing up purulent sputum as well. She has been taking over-the-counter medications without relief. She had one episode of post tussive vomiting this morning but has not had any more nausea or vomiting. She also admits to recent leg swelling which is abnormal for her and says the left leg has been more swollen than the right leg, but they have both starting to go down. She denies fever, chills, lightheadedness, diarrhea, recent travel, sick contacts. Her last radiation therapy was in May, approximately 7 months ago. She has had no recent hospitalizations.  Patient is a 58 y.o. female presenting with flu symptoms.  Influenza Presenting symptoms: cough, fatigue, shortness of breath and sore throat   Presenting symptoms: no fever, no myalgias, no nausea and no vomiting   Associated symptoms: no chills and no ear pain     Past Medical History  Diagnosis Date  . Depression   . Thyroid disease   . GERD (gastroesophageal reflux disease)   . Pancreatitis     hx of  . Knee pain, bilateral   . Obesity   . HSV (herpes simplex virus) infection   . Hyperlipemia   . Asthma    . Tachycardia     with sx in 2008  . Breast mass in female     L breast 2008, US showed likely fat necrosis  . Arthritis   . Anxiety   . Heartburn   . Hypertension     sees Dr. Criss Rosales , [facility] [place]  . Hx of radiation therapy 05/05/12- 07/15/12    right breast, 45 gray x 25 fx, lumpectomy cavity boosted to 16.2 gray  . Sleep apnea     uses cpap, pt does not know settings  . History of kidney stones   . Headache(784.0)   . 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  . Anemia   . History of blood transfusion 2011  . Hypothyroidism   . Elevated glucose     pt denies diabetes glucose elevated on cmet glucose  08-03-2012  . 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 Polyneuropathy in diabetes(357.2)   . Benign paroxysmal positional vertigo 12/30/2012   Past Surgical History  Procedure Laterality Date  . Replacement total knee bilateral Bilateral yrs ago  . Foot surgery Right 10 yrs ago  . Abdominal surgery    . Thyroid surgery  yrs ago  . Joint replacement    . Abdominal hysterectomy  yrs ago    partial  . Breast surgery Right   . Breast lumpectomy  03/03/2012    Procedure: LUMPECTOMY;  Surgeon: Haywood Lasso, MD;  Location: [facility];  Service: General;  Laterality: Right;  .  Cholecystectomy  yrs ago  . Colonoscopy with propofol N/A 09/28/2012    Procedure: COLONOSCOPY WITH PROPOFOL;  Surgeon: Lear Ng, MD;  Location: [facility];  Service: Endoscopy;  Laterality: N/A;   Family History  Problem Relation Age of Onset  . Heart disease Brother   . Diabetes Brother   . Hypertension Mother   . Diabetes Mother   . Breast cancer Mother 49  . Bone cancer Mother   . Hypertension Sister   . Diabetes Sister   . Breast cancer Sister 34  . Breast cancer Maternal Grandmother   . Heart disease Maternal Grandmother   . Uterine cancer Other 19  . Breast cancer Paternal Aunt 101  . Breast cancer Paternal Grandmother     dx in her  40s  . Prostate cancer Paternal Grandfather    History  Substance Use Topics  . Smoking status: Former Smoker -- 1.00 packs/day for 7 years    Types: Cigarettes    Quit date: 03/24/1992  . Smokeless tobacco: Never Used  . Alcohol Use: No   OB History   Grav Para Term Preterm Abortions TAB SAB Ect Mult Living                 Review of Systems  Constitutional: Positive for activity change, appetite change and fatigue. Negative for fever, chills and unexpected weight change.  HENT: Positive for sore throat. Negative for ear pain.   Eyes: Negative for visual disturbance.  Respiratory: Positive for cough, chest tightness and shortness of breath.   Cardiovascular: Positive for chest pain (pleuritic). Negative for palpitations and leg swelling.  Gastrointestinal: Negative for nausea, vomiting and abdominal pain.  Endocrine: Negative for polydipsia and polyuria.  Genitourinary: Negative for dysuria, urgency and frequency.  Musculoskeletal: Negative for arthralgias and myalgias.  Skin: Negative for rash.  Neurological: Negative for dizziness, weakness and light-headedness.    Allergies  Shellfish allergy; Bee venom; Morphine and related; and Oxycodone hcl  Home Medications   Current Outpatient Rx  Name  Route  Sig  Dispense  Refill  . anastrozole (ARIMIDEX) 1 MG tablet   Oral   Take 1 mg by mouth at bedtime.         . ARIPiprazole (ABILIFY) 10 MG tablet   Oral   Take 10 mg by mouth every morning.          [name] aspirin 81 MG tablet   Oral   Take 81 mg by mouth daily.         [name] atorvastatin (LIPITOR) 80 MG tablet   Oral   Take 80 mg by mouth daily.         . budesonide-formoterol (SYMBICORT) 80-4.5 MCG/ACT inhaler   Inhalation   Inhale 1-2 puffs into the lungs daily as needed. For shortness of breath         . CALCIUM PO   Oral   Take 2 tablets by mouth daily.         . cholecalciferol (VITAMIN D) 1000 UNITS tablet   Oral   Take 1 tablet (1,000 Units  total) by mouth daily.   30 tablet   12   . desonide (DESOWEN) 0.05 % ointment   Topical   Apply 1 application topically 2 (two) times daily.          [name] esomeprazole (NEXIUM) 40 MG capsule   Oral   Take 40 mg by mouth daily before breakfast.         . fluticasone (  CUTIVATE) 0.05 % cream   Topical   Apply 1 application topically 2 (two) times daily as needed (rash).          . gabapentin (NEURONTIN) 100 MG capsule   Oral   Take 100 mg by mouth daily.         . hyaluronate sodium (RADIAPLEXRX) GEL   Topical   Apply 1 application topically 2 (two) times daily.         [name] HYDROcodone-acetaminophen (NORCO/VICODIN) 5-325 MG per tablet   Oral   Take 1-2 tablets by mouth every 4 (four) hours as needed for pain.   60 tablet   0   . insulin aspart (NOVOLOG) 100 UNIT/ML injection   Subcutaneous   Inject 2 Units into the skin 3 (three) times daily before meals.         [name] lisinopril (PRINIVIL,ZESTRIL) 2.5 MG tablet   Oral   Take 2.5 mg by mouth daily.         [name] LORazepam (ATIVAN) 1 MG tablet   Oral   Take 1 mg by mouth every 8 (eight) hours as needed. For anxiety         . meloxicam (MOBIC) 15 MG tablet   Oral   Take 15 mg by mouth every morning.          . metFORMIN (GLUCOPHAGE) 500 MG tablet   Oral   Take 1 tablet (500 mg total) by mouth 2 (two) times daily with a meal.   20 tablet   0   . Multiple Vitamin (MULTIVITAMIN WITH MINERALS) TABS   Oral   Take 1 tablet by mouth daily.         . non-metallic deodorant Jethro Poling) MISC   Topical   Apply 1 application topically daily as needed (for deodorant/antiperspirant.).          [name] NOVOFINE 32G X 6 MM MISC   Intramuscular   Inject 32 g into the muscle daily.         . pravastatin (PRAVACHOL) 40 MG tablet   Oral   Take 80 mg by mouth at bedtime.         . silver sulfADIAZINE (SILVADENE) 1 % cream   Topical   Apply 1 application topically daily.         . traZODone (DESYREL) 100 MG tablet    Oral   Take 100 mg by mouth at bedtime.         . valACYclovir (VALTREX) 1000 MG tablet   Oral   Take 1,000 mg by mouth daily.         . VOLTAREN 1 % GEL   Topical   Apply 2 g topically 2 (two) times daily.          [name] zolpidem (AMBIEN) 10 MG tablet   Oral   Take 10 mg by mouth at bedtime as needed. For sleep         . hydrochlorothiazide (HYDRODIURIL) 25 MG tablet   Oral   Take 1 tablet (25 mg total) by mouth daily.   30 tablet   0   . JANUMET 50-500 MG per tablet   Oral   Take 50-500 tablets by mouth 2 (two) times daily with a meal.          . LEVEMIR FLEXTOUCH 100 UNIT/ML SOPN   Intramuscular   Inject 100 mLs into the muscle at bedtime.         [name] levothyroxine (SYNTHROID, LEVOTHROID) 200 MCG  tablet   Oral   Take 300 mcg by mouth daily before breakfast.          . ONETOUCH DELICA LANCETS 24M MISC   Intramuscular   Inject 33 g into the muscle 3 (three) times daily.         Glory Rosebush VERIO test strip   Other   100 each by Other route 3 (three) times daily.         [name] PARoxetine (PAXIL) 20 MG tablet   Oral   Take 30 mg by mouth every morning.          BP 128/80  Pulse 108  Temp(Src) 98 F (36.7 C) (Oral)  Resp 16  SpO2 99% Physical Exam  Nursing note and vitals reviewed. Constitutional: She is oriented to person, place, and time. Vital signs are normal. She appears well-developed and well-nourished. No distress.  HENT:  Head: Normocephalic and atraumatic.  Left Ear: External ear normal.  Nose: Nose normal.  Mouth/Throat: Oropharynx is clear and moist. No oropharyngeal exudate.  Neck: Normal range of motion. Neck supple.  Cardiovascular: Normal rate, regular rhythm and normal heart sounds.  Exam reveals no gallop and no friction rub.   No murmur heard. Pulmonary/Chest: Effort normal. No respiratory distress. She has no wheezes. She has rales (left upper lobe).  Lymphadenopathy:    She has no cervical adenopathy.  Neurological: She is  alert and oriented to person, place, and time. She has normal strength. Coordination normal.  Skin: Skin is warm and dry. No rash noted. She is not diaphoretic.  Psychiatric: She has a normal mood and affect. Judgment normal.    ED Course  Procedures (including critical care time) Labs Review Labs Reviewed - No data to display Imaging Review Dg Chest 2 View  02/18/2013   CLINICAL DATA:  Six 4 days with cough  EXAM: CHEST  2 VIEW  COMPARISON:  None.  FINDINGS: The heart size and mediastinal contours are within normal limits. Both lungs are clear. The visualized skeletal structures are unremarkable. There are surgical clips in the right lower neck.  IMPRESSION: No active cardiopulmonary disease.   Electronically Signed   By: Kathreen Devoid   On: 02/18/2013 18:52      MDM   1. Pleuritic chest pain   2. Shortness of breath    After discussing this patient's shortness of breath and chest pain with her at length, we cannot reliably rule out PE. She has been so short of breath at home that she has had to use her CPAP machine during the daytime just to remain comfortable. She has the risk factors of recent malignancy and possible unilateral leg swelling. She is short of breath and has orthopnea. Her vital signs are normal with no hypoxemia or tachycardia, she does not have a beta blocker listed in her medications. Her chest x-ray is normal.    Liam Graham, PA-C 02/18/13 1936

## 2013-02-18 NOTE — ED Notes (Addendum)
Pt. reports hoarse voice with productive cough , SOB , chest congestion / tightness onset last week. Seen at [facility] this evening - chest x-ray done w/c is negative . Sent here for further evaluation / rule out PE .

## 2013-02-19 ENCOUNTER — Encounter (HOSPITAL_COMMUNITY): Payer: Self-pay | Admitting: Radiology

## 2013-02-19 ENCOUNTER — Emergency Department (HOSPITAL_COMMUNITY): Payer: Medicare Other

## 2013-02-19 MED ORDER — SODIUM CHLORIDE 0.9 % IV BOLUS (SEPSIS)
1000.0000 mL | Freq: Once | INTRAVENOUS | Status: AC
  Administered 2013-02-19: 1000 mL via INTRAVENOUS

## 2013-02-19 MED ORDER — IOHEXOL 350 MG/ML SOLN
80.0000 mL | Freq: Once | INTRAVENOUS | Status: AC | PRN
  Administered 2013-02-19: 80 mL via INTRAVENOUS

## 2013-02-19 MED ORDER — AZITHROMYCIN 250 MG PO TABS
ORAL_TABLET | ORAL | Status: DC
Start: 2013-02-19 — End: 2013-04-21

## 2013-02-19 NOTE — ED Provider Notes (Signed)
Medical screening examination/treatment/procedure(s) were performed by resident physician or non-physician practitioner and as supervising physician I was immediately available for consultation/collaboration.   Pauline Good MD.   Billy Fischer, MD 02/19/13 2023

## 2013-02-19 NOTE — ED Provider Notes (Addendum)
CSN: [id number]     Arrival date & time 02/18/13  1950 History   First MD Initiated Contact with Patient 02/18/13 2337     Chief Complaint  Patient presents with  . Shortness of Breath  . Cough   (Consider location/radiation/quality/duration/timing/severity/associated sxs/prior Treatment) The history is provided by the patient.  [name] is a 58 y.o. female hx of GERD, pancreatitis, obesity, DM here with possible laryngitis. She's been having some body aches and cough as well as sore throat for the last 4 days. The last 2 days she notes that she has some hoarseness in her voice and states that this is typical of her laryngitis. Denies any fevers or chills. He stated that she sometimes gets short of breath when this happens. She went to urgent care was sent in for possible PE. Also bilateral leg swelling that is chronic.    Past Medical History  Diagnosis Date  . Depression   . Thyroid disease   . GERD (gastroesophageal reflux disease)   . Pancreatitis     hx of  . Knee pain, bilateral   . Obesity   . HSV (herpes simplex virus) infection   . Hyperlipemia   . Asthma   . Tachycardia     with sx in 2008  . Breast mass in female     L breast 2008, US showed likely fat necrosis  . Arthritis   . Anxiety   . Heartburn   . Hypertension     sees Dr. Criss Rosales , [facility] [place]  . Hx of radiation therapy 05/05/12- 07/15/12    right breast, 45 gray x 25 fx, lumpectomy cavity boosted to 16.2 gray  . Sleep apnea     uses cpap, pt does not know settings  . History of kidney stones   . Headache(784.0)   . 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  . Anemia   . History of blood transfusion 2011  . Hypothyroidism   . Elevated glucose     pt denies diabetes glucose elevated on cmet glucose  08-03-2012  . 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 Polyneuropathy in diabetes(357.2)   . Benign paroxysmal positional vertigo 12/30/2012   Past  Surgical History  Procedure Laterality Date  . Replacement total knee bilateral Bilateral yrs ago  . Foot surgery Right 10 yrs ago  . Abdominal surgery    . Thyroid surgery  yrs ago  . Joint replacement    . Abdominal hysterectomy  yrs ago    partial  . Breast surgery Right   . Breast lumpectomy  03/03/2012    Procedure: LUMPECTOMY;  Surgeon: Haywood Lasso, MD;  Location: [place];  Service: General;  Laterality: Right;  . Cholecystectomy  yrs ago  . Colonoscopy with propofol N/A 09/28/2012    Procedure: COLONOSCOPY WITH PROPOFOL;  Surgeon: Lear Ng, MD;  Location: [facility];  Service: Endoscopy;  Laterality: N/A;   Family History  Problem Relation Age of Onset  . Heart disease Brother   . Diabetes Brother   . Hypertension Mother   . Diabetes Mother   . Breast cancer Mother 50  . Bone cancer Mother   . Hypertension Sister   . Diabetes Sister   . Breast cancer Sister 41  . Breast cancer Maternal Grandmother   . Heart disease Maternal Grandmother   . Uterine cancer Other 19  . Breast cancer Paternal Aunt 88  . Breast cancer  Paternal Grandmother     dx in her 34s  . Prostate cancer Paternal Grandfather    History  Substance Use Topics  . Smoking status: Former Smoker -- 1.00 packs/day for 7 years    Types: Cigarettes    Quit date: 03/24/1992  . Smokeless tobacco: Never Used  . Alcohol Use: No   OB History   Grav Para Term Preterm Abortions TAB SAB Ect Mult Living                 Review of Systems  Respiratory: Positive for cough and shortness of breath.   All other systems reviewed and are negative.    Allergies  Shellfish allergy; Bee venom; Morphine and related; and Oxycodone hcl  Home Medications   Current Outpatient Rx  Name  Route  Sig  Dispense  Refill  . anastrozole (ARIMIDEX) 1 MG tablet   Oral   Take 1 mg by mouth at bedtime.         . ARIPiprazole (ABILIFY) 10 MG tablet   Oral   Take 10 mg by mouth daily.          [name] aspirin  EC 81 MG tablet   Oral   Take 81 mg by mouth daily.         [name] atorvastatin (LIPITOR) 80 MG tablet   Oral   Take 80 mg by mouth daily.         [name] CALCIUM PO   Oral   Take 1 tablet by mouth daily.          . cholecalciferol (VITAMIN D) 1000 UNITS tablet   Oral   Take 1 tablet (1,000 Units total) by mouth daily.   30 tablet   12   . cycloSPORINE (RESTASIS) 0.05 % ophthalmic emulsion   Both Eyes   Place 1 drop into both eyes 2 (two) times daily.         . diclofenac sodium (VOLTAREN) 1 % GEL   Topical   Apply 1 application topically 2 (two) times daily as needed (pain).         [name] esomeprazole (NEXIUM) 40 MG capsule   Oral   Take 40 mg by mouth daily before breakfast.         . fluticasone (CUTIVATE) 0.05 % cream   Topical   Apply 1 application topically daily as needed (rash).          . gabapentin (NEURONTIN) 100 MG capsule   Oral   Take 100 mg by mouth daily.         . hyaluronate sodium (RADIAPLEXRX) GEL   Topical   Apply 1 application topically 2 (two) times daily.         . hydrochlorothiazide (HYDRODIURIL) 25 MG tablet   Oral   Take 1 tablet (25 mg total) by mouth daily.   30 tablet   0   . HYDROcodone-acetaminophen (NORCO/VICODIN) 5-325 MG per tablet   Oral   Take 1 tablet by mouth 2 (two) times daily as needed for moderate pain.         [name] insulin aspart (NOVOLOG FLEXPEN) 100 UNIT/ML FlexPen   Subcutaneous   Inject 2 Units into the skin 3 (three) times daily with meals.         . Insulin Detemir (LEVEMIR FLEXPEN) 100 UNIT/ML Pen   Subcutaneous   Inject 15 Units into the skin at bedtime.         [name] levothyroxine (SYNTHROID, LEVOTHROID) 200 MCG  tablet   Oral   Take 200 mcg by mouth daily before breakfast.          . lisinopril (PRINIVIL,ZESTRIL) 2.5 MG tablet   Oral   Take 2.5 mg by mouth daily.         [name] LORazepam (ATIVAN) 1 MG tablet   Oral   Take 1 mg by mouth every 8 (eight) hours as needed for anxiety.           . meloxicam (MOBIC) 15 MG tablet   Oral   Take 15 mg by mouth daily.          . Multiple Vitamin (MULTIVITAMIN WITH MINERALS) TABS   Oral   Take 1 tablet by mouth daily.         [name] PARoxetine (PAXIL) 20 MG tablet   Oral   Take 20 mg by mouth daily.          . pravastatin (PRAVACHOL) 40 MG tablet   Oral   Take 40 mg by mouth at bedtime.          . sitaGLIPtin-metformin (JANUMET) 50-500 MG per tablet   Oral   Take 1 tablet by mouth 2 (two) times daily with a meal.         . traZODone (DESYREL) 100 MG tablet   Oral   Take 100 mg by mouth at bedtime.         . valACYclovir (VALTREX) 1000 MG tablet   Oral   Take 1,000 mg by mouth daily.         [name] zolpidem (AMBIEN) 10 MG tablet   Oral   Take 10 mg by mouth at bedtime as needed for sleep.          [name] NOVOFINE 32G X 6 MM MISC   Intramuscular   Inject 32 g into the muscle daily.         Glory Rosebush DELICA LANCETS 44W MISC   Intramuscular   Inject 33 g into the muscle 3 (three) times daily.         Glory Rosebush VERIO test strip   Other   100 each by Other route 3 (three) times daily.          BP 107/65  Pulse 100  Temp(Src) 98.1 F (36.7 C) (Oral)  Resp 20  SpO2 99% Physical Exam  Nursing note and vitals reviewed. Constitutional: She is oriented to person, place, and time. She appears well-nourished.  Overweight, slightly uncomfortable. Hoarse voice   HENT:  Head: Normocephalic.  Mouth/Throat: Oropharynx is clear and moist.  Eyes: Conjunctivae are normal. Pupils are equal, round, and reactive to light.  Neck: Normal range of motion. Neck supple.  Cardiovascular: Normal rate, regular rhythm and normal heart sounds.   Pulmonary/Chest: Effort normal and breath sounds normal. No respiratory distress. She has no wheezes. She has no rales.  Abdominal: Soft. Bowel sounds are normal. She exhibits no distension. There is no tenderness. There is no rebound and no guarding.  Musculoskeletal: Normal range  of motion.  2+ edema bilateral legs   Neurological: She is alert and oriented to person, place, and time. Coordination normal.  Skin: Skin is warm and dry.  Psychiatric: She has a normal mood and affect. Her behavior is normal. Judgment and thought content normal.    ED Course  Procedures (including critical care time) Labs Review Labs Reviewed  CBC WITH DIFFERENTIAL - Abnormal; Notable for the following:    RBC 3.67 (*)  Hemoglobin 10.2 (*)    HCT 31.4 (*)    RDW 16.7 (*)    Eosinophils Relative 6 (*)    All other components within normal limits  COMPREHENSIVE METABOLIC PANEL - Abnormal; Notable for the following:    Glucose, Bld 135 (*)    Creatinine, Ser 1.14 (*)    Albumin 3.4 (*)    Total Bilirubin <0.2 (*)    GFR calc non Af Amer 53 (*)    GFR calc Af Amer 61 (*)    All other components within normal limits  D-DIMER, QUANTITATIVE - Abnormal; Notable for the following:    D-Dimer, Quant 1.38 (*)    All other components within normal limits  POCT I-STAT TROPONIN I   Imaging Review Dg Chest 2 View  02/18/2013   CLINICAL DATA:  Six 4 days with cough  EXAM: CHEST  2 VIEW  COMPARISON:  None.  FINDINGS: The heart size and mediastinal contours are within normal limits. Both lungs are clear. The visualized skeletal structures are unremarkable. There are surgical clips in the right lower neck.  IMPRESSION: No active cardiopulmonary disease.   Electronically Signed   By: Kathreen Devoid   On: 02/18/2013 18:52   Ct Angio Chest Pe W/cm &/or Wo Cm  02/19/2013   CLINICAL DATA:  Chest pain and shortness of Breath.  EXAM: CT ANGIOGRAPHY CHEST WITH CONTRAST  TECHNIQUE: Multidetector CT imaging of the chest was performed using the standard protocol during bolus administration of intravenous contrast. Multiplanar CT image reconstructions including MIPs were obtained to evaluate the vascular anatomy.  CONTRAST:  48m OMNIPAQUE IOHEXOL 350 MG/ML SOLN  COMPARISON:  06/04/2011  FINDINGS: The chest  wall is unremarkable. No breast masses, supraclavicular or axillary lymphadenopathy. There are surgical changes from a right thyroid lobectomy. The bony thorax is intact. No destructive bone lesions or spinal canal compromise.  The heart is normal in size. No pericardial effusion. Prominent pericardial and epicardial fat. No mediastinal or hilar mass or adenopathy. Small scattered nodes are noted. The aorta is normal in caliber. No dissection. Coronary artery calcifications are noted. The esophagus is grossly normal.  The pulmonary arterial tree is fairly well opacified. No definite filling defects to suggest pulmonary embolism.  The lungs are clear.  No pleural effusion.  The upper abdomen is unremarkable.  Review of the MIP images confirms the above findings.  IMPRESSION: No CT findings for pulmonary embolism.  Normal thoracic aorta.  No acute pulmonary findings.   Electronically Signed   By: MKalman JewelsM.D.   On: 02/19/2013 01:02    EKG Interpretation    Date/Time:  Friday February 18 2013 20:15:23 EST Ventricular Rate:  98 PR Interval:  158 QRS Duration: 74 QT Interval:  364 QTC Calculation: 464 R Axis:   -7 Text Interpretation:  Normal sinus rhythm Low voltage QRS Borderline ECG No significant change since last tracing Confirmed by YAO  MD, DAVID ([phone] on 02/19/2013 2:39:17 AM            MDM  No diagnosis found. Tammie BARLEYis a 58y.o. female here with laryngitis. I think symptoms likely from laryngitis. D-dimer done in triage and was elevated. Will do CT angio chest but I have low suspicion for PE.   2:39 AM CT showed no PE. She request Zpack for laryngitis. I counseled her that it may not work given that its likely viral. Given hx of asthma, will give zpack for now.   DWandra Arthurs  MD 02/19/13 7319  Wandra Arthurs, MD 02/19/13 [phone]

## 2013-02-19 NOTE — Discharge Instructions (Signed)
Take zpack as prescribed.   Follow up with your doctor.   Take motrin for pain.   Return to ER if you have trouble breathing, shortness of breath.

## 2013-02-20 ENCOUNTER — Other Ambulatory Visit: Payer: Medicare Other

## 2013-02-20 ENCOUNTER — Inpatient Hospital Stay: Admission: RE | Admit: 2013-02-20 | Payer: Medicare Other | Source: Ambulatory Visit

## 2013-02-21 ENCOUNTER — Ambulatory Visit: Payer: Medicare Other | Attending: Radiation Oncology | Admitting: Radiation Oncology

## 2013-02-23 ENCOUNTER — Telehealth: Payer: Self-pay | Admitting: Neurology

## 2013-02-23 ENCOUNTER — Other Ambulatory Visit: Payer: Medicare Other

## 2013-03-11 ENCOUNTER — Telehealth: Payer: Self-pay | Admitting: *Deleted

## 2013-03-11 ENCOUNTER — Telehealth: Payer: Self-pay | Admitting: Oncology

## 2013-03-11 ENCOUNTER — Encounter: Payer: Self-pay | Admitting: *Deleted

## 2013-03-11 NOTE — Telephone Encounter (Signed)
, °

## 2013-03-11 NOTE — Telephone Encounter (Signed)
Sent "no show" letter to patient 03/11/13  Missed FU appointment 02/21/13

## 2013-03-21 ENCOUNTER — Ambulatory Visit: Payer: Medicare Other | Admitting: Physician Assistant

## 2013-03-21 ENCOUNTER — Other Ambulatory Visit: Payer: Medicare Other

## 2013-03-22 ENCOUNTER — Ambulatory Visit: Payer: Medicare Other | Admitting: Physician Assistant

## 2013-03-22 ENCOUNTER — Other Ambulatory Visit: Payer: Medicare Other

## 2013-03-22 ENCOUNTER — Ambulatory Visit: Payer: Medicare Other | Admitting: Oncology

## 2013-04-04 ENCOUNTER — Other Ambulatory Visit: Payer: Self-pay | Admitting: Surgery

## 2013-04-04 DIAGNOSIS — Z853 Personal history of malignant neoplasm of breast: Secondary | ICD-10-CM

## 2013-04-04 DIAGNOSIS — Z9889 Other specified postprocedural states: Secondary | ICD-10-CM

## 2013-04-11 ENCOUNTER — Other Ambulatory Visit (HOSPITAL_COMMUNITY): Payer: Self-pay | Admitting: Family Medicine

## 2013-04-11 ENCOUNTER — Ambulatory Visit: Payer: Medicare Other | Admitting: Radiation Oncology

## 2013-04-11 DIAGNOSIS — Z9889 Other specified postprocedural states: Secondary | ICD-10-CM

## 2013-04-11 DIAGNOSIS — Z853 Personal history of malignant neoplasm of breast: Secondary | ICD-10-CM

## 2013-04-13 ENCOUNTER — Other Ambulatory Visit: Payer: Self-pay | Admitting: Nurse Practitioner

## 2013-04-13 DIAGNOSIS — Z9889 Other specified postprocedural states: Secondary | ICD-10-CM

## 2013-04-13 DIAGNOSIS — Z853 Personal history of malignant neoplasm of breast: Secondary | ICD-10-CM

## 2013-04-15 ENCOUNTER — Other Ambulatory Visit: Payer: Self-pay | Admitting: Oncology

## 2013-04-15 ENCOUNTER — Ambulatory Visit
Admission: RE | Admit: 2013-04-15 | Discharge: 2013-04-15 | Disposition: A | Discharge status: Home / Self Care | Payer: Medicaid Other | Source: Ambulatory Visit | Attending: Surgery | Admitting: Surgery

## 2013-04-15 DIAGNOSIS — Z9889 Other specified postprocedural states: Secondary | ICD-10-CM

## 2013-04-15 DIAGNOSIS — Z853 Personal history of malignant neoplasm of breast: Secondary | ICD-10-CM

## 2013-04-18 ENCOUNTER — Other Ambulatory Visit: Payer: Self-pay | Admitting: Orthopedic Surgery

## 2013-04-18 ENCOUNTER — Telehealth: Payer: Self-pay | Admitting: Oncology

## 2013-04-18 DIAGNOSIS — R200 Anesthesia of skin: Secondary | ICD-10-CM

## 2013-04-18 DIAGNOSIS — M5410 Radiculopathy, site unspecified: Secondary | ICD-10-CM

## 2013-04-18 NOTE — Telephone Encounter (Signed)
PER [facility] MOVED 3/23 APPT FROM [facility] TO [facility]. S/W PT RE CHANGE AND  PT OK W/CP2 BUT WANTS TO MOVE APPT TO 3/26. PT HAS NEW D/T

## 2013-04-21 ENCOUNTER — Encounter: Payer: Self-pay | Admitting: Radiation Oncology

## 2013-04-21 ENCOUNTER — Ambulatory Visit
Admission: RE | Admit: 2013-04-21 | Discharge: 2013-04-21 | Disposition: A | Discharge status: Home / Self Care | Payer: Medicare Other | Source: Ambulatory Visit | Attending: Radiation Oncology | Admitting: Radiation Oncology

## 2013-04-21 VITALS — BP 114/54 | HR 103 | Temp 97.7°F | Ht 65.0 in | Wt 322.8 lb

## 2013-04-21 DIAGNOSIS — C50411 Malignant neoplasm of upper-outer quadrant of right female breast: Secondary | ICD-10-CM
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
## 2013-04-21 NOTE — Progress Notes (Signed)
Radiation Oncology         ([phone]) [phone] ________________________________  Name: [name] MRN: [id number]  Date: 04/21/2013  DOB: [birth year]  Follow-Up Visit Note  CC: Cooper Render, NP  Streck, Autumn Patty, MD  Diagnosis:   Intraductal carcinoma of the right breast  Interval Since Last Radiation:  10  months  Narrative:  The patient returns today for routine follow-up.  She continues to have low back pain. She is scheduled for MRI ordered by her orthopedic surgeon. The patient has started on Arimidex and seems to be tolerating this medication well. She did undergo mammography last month showing no suspicious areas in either breast. Patient has occasional discomfort in the breast but no real pain at this time. She denies any nipple discharge or bleeding.                              ALLERGIES:  is allergic to shellfish allergy; bee venom; morphine and related; and oxycodone hcl.  Meds: Current Outpatient Prescriptions  Medication Sig Dispense Refill  . anastrozole (ARIMIDEX) 1 MG tablet Take 1 mg by mouth at bedtime.      . ARIPiprazole (ABILIFY) 10 MG tablet Take 10 mg by mouth daily.       [name] aspirin EC 81 MG tablet Take 81 mg by mouth daily.      . Blood Glucose Monitoring Suppl (ACCU-CHEK AVIVA PLUS) W/DEVICE KIT       . CALCIUM PO Take 1 tablet by mouth daily.       . cholecalciferol (VITAMIN D) 1000 UNITS tablet Take 1 tablet (1,000 Units total) by mouth daily.  30 tablet  12  . CRESTOR 10 MG tablet       . cycloSPORINE (RESTASIS) 0.05 % ophthalmic emulsion Place 1 drop into both eyes 2 (two) times daily.      . diclofenac sodium (VOLTAREN) 1 % GEL Apply 1 application topically 2 (two) times daily as needed (pain).      [name] esomeprazole (NEXIUM) 40 MG capsule Take 40 mg by mouth daily before breakfast.      . fluticasone (CUTIVATE) 0.05 % cream Apply 1 application topically daily as needed (rash).       . gabapentin (NEURONTIN) 100 MG capsule Take 100 mg by mouth daily.       . insulin aspart (NOVOLOG FLEXPEN) 100 UNIT/ML FlexPen Inject 2 Units into the skin 3 (three) times daily with meals.      . Insulin Detemir (LEVEMIR FLEXPEN) 100 UNIT/ML Pen Inject 15 Units into the skin at bedtime.      [name] levothyroxine (SYNTHROID, LEVOTHROID) 200 MCG tablet Take 200 mcg by mouth daily before breakfast.       . lisinopril (PRINIVIL,ZESTRIL) 2.5 MG tablet Take 2.5 mg by mouth daily.      [name] LORazepam (ATIVAN) 1 MG tablet Take 1 mg by mouth every 8 (eight) hours as needed for anxiety.       Glory Rosebush DELICA LANCETS 74X MISC Inject 33 g into the muscle 3 (three) times daily.      Glory Rosebush VERIO test strip 100 each by Other route 3 (three) times daily.      [name] PARoxetine (PAXIL) 20 MG tablet Take 20 mg by mouth daily.       . sitaGLIPtin-metformin (JANUMET) 50-500 MG per tablet Take 1 tablet by mouth 2 (two) times daily with a meal.      .  traZODone (DESYREL) 100 MG tablet Take 100 mg by mouth at bedtime.      . valACYclovir (VALTREX) 1000 MG tablet Take 1,000 mg by mouth daily.      . valsartan-hydrochlorothiazide (DIOVAN-HCT) 160-12.5 MG per tablet       . zolpidem (AMBIEN) 10 MG tablet Take 10 mg by mouth at bedtime as needed for sleep.       . hydrochlorothiazide (HYDRODIURIL) 25 MG tablet Take 1 tablet (25 mg total) by mouth daily.  30 tablet  0  . meloxicam (MOBIC) 15 MG tablet Take 15 mg by mouth daily.       . Multiple Vitamin (MULTIVITAMIN WITH MINERALS) TABS Take 1 tablet by mouth daily.      [name] NOVOFINE 32G X 6 MM MISC Inject 32 g into the muscle daily.       No current facility-administered medications for this encounter.    Physical Findings: The patient is in no acute distress. Patient is alert and oriented.  height is _0  (1.651 m) and weight is 322 lb 12.8 oz (146.421 kg). Her temperature is 97.7 F (36.5 C). Her blood pressure is 114/54 and her pulse is 103. Her oxygen saturation is 96%. .  No palpable supraclavicular or axillary adenopathy. The lungs are  clear to auscultation. The heart has a regular rhythm and rate. Examination of the left breast reveals no mass or node discharge. Examination right breast reveals mild hyperpigmentation changes. There is no dominant mass appreciated in the breast,  nipple discharge or bleeding.  Lab Findings: Lab Results  Component Value Date   WBC 10.4 02/18/2013   HGB 10.2* 02/18/2013   HCT 31.4* 02/18/2013   MCV 85.6 02/18/2013   PLT 204 02/18/2013      Radiographic Findings: Mm Digital Diagnostic Bilat  04/15/2013   CLINICAL DATA:  Patient presents for bilateral diagnostic mammogram due to a history of a prior right malignant lumpectomy January 2014.  EXAM: DIGITAL DIAGNOSTIC  bilateral MAMMOGRAM WITH CAD  COMPARISON:  Previous exams.  ACR Breast Density Category b: There are scattered areas of fibroglandular density.  FINDINGS: Exam demonstrates stable post lumpectomy changes of the upper outer right breast. Remainder of the exam is unchanged.  Mammographic images were processed with CAD.  IMPRESSION: Stable post lumpectomy changes over the upper outer right breast.  RECOMMENDATION: Recommend continued annual bilateral diagnostic mammographic evaluation.  I have discussed the findings and recommendations with the patient. Results were also provided in writing at the conclusion of the visit. If applicable, a reminder letter will be sent to the patient regarding the next appointment.  BI-RADS CATEGORY  2: Benign finding(s).   Electronically Signed   By: Marin Olp M.D.   On: 04/15/2013 11:22    Impression:  No evidence of recurrence on clinical exam today  Plan:  When necessary followup in radiation oncology. The patient will continue close followup in medical oncology.  ____________________________________ Blair Promise, MD

## 2013-04-28 ENCOUNTER — Ambulatory Visit
Admission: RE | Admit: 2013-04-28 | Discharge: 2013-04-28 | Disposition: A | Discharge status: Home / Self Care | Payer: Medicaid Other | Source: Ambulatory Visit | Attending: Orthopedic Surgery | Admitting: Orthopedic Surgery

## 2013-04-28 DIAGNOSIS — R200 Anesthesia of skin: Secondary | ICD-10-CM

## 2013-04-28 DIAGNOSIS — M5410 Radiculopathy, site unspecified: Secondary | ICD-10-CM

## 2013-05-09 ENCOUNTER — Ambulatory Visit: Payer: Medicare Other

## 2013-05-09 ENCOUNTER — Other Ambulatory Visit: Payer: Medicare Other

## 2013-05-11 ENCOUNTER — Other Ambulatory Visit: Payer: Self-pay | Admitting: *Deleted

## 2013-05-11 DIAGNOSIS — C50411 Malignant neoplasm of upper-outer quadrant of right female breast: Secondary | ICD-10-CM

## 2013-05-12 ENCOUNTER — Other Ambulatory Visit (HOSPITAL_BASED_OUTPATIENT_CLINIC_OR_DEPARTMENT_OTHER): Payer: Medicare Other

## 2013-05-12 ENCOUNTER — Telehealth: Payer: Self-pay | Admitting: Hematology and Oncology

## 2013-05-12 ENCOUNTER — Ambulatory Visit (HOSPITAL_BASED_OUTPATIENT_CLINIC_OR_DEPARTMENT_OTHER): Payer: Medicare Other

## 2013-05-12 ENCOUNTER — Ambulatory Visit (HOSPITAL_BASED_OUTPATIENT_CLINIC_OR_DEPARTMENT_OTHER): Payer: Medicare Other | Admitting: Hematology and Oncology

## 2013-05-12 ENCOUNTER — Encounter: Payer: Self-pay | Admitting: Hematology and Oncology

## 2013-05-12 VITALS — BP 96/58 | HR 81 | Temp 98.2°F | Resp 18 | Ht 65.0 in | Wt 322.4 lb

## 2013-05-12 DIAGNOSIS — D539 Nutritional anemia, unspecified: Secondary | ICD-10-CM

## 2013-05-12 DIAGNOSIS — D0590 Unspecified type of carcinoma in situ of unspecified breast: Secondary | ICD-10-CM

## 2013-05-12 DIAGNOSIS — C50411 Malignant neoplasm of upper-outer quadrant of right female breast: Secondary | ICD-10-CM

## 2013-05-12 DIAGNOSIS — N289 Disorder of kidney and ureter, unspecified: Secondary | ICD-10-CM

## 2013-05-12 DIAGNOSIS — I10 Essential (primary) hypertension: Secondary | ICD-10-CM

## 2013-05-12 DIAGNOSIS — E119 Type 2 diabetes mellitus without complications: Secondary | ICD-10-CM

## 2013-05-12 DIAGNOSIS — E039 Hypothyroidism, unspecified: Secondary | ICD-10-CM

## 2013-05-12 DIAGNOSIS — D649 Anemia, unspecified: Secondary | ICD-10-CM

## 2013-05-12 LAB — IRON AND TIBC CHCC
%SAT: 12 % — ABNORMAL LOW (ref 21–57)
IRON: 41 ug/dL (ref 41–142)
TIBC: 359 ug/dL (ref 236–444)
UIBC: 317 ug/dL (ref 120–384)

## 2013-05-12 LAB — CBC WITH DIFFERENTIAL/PLATELET
BASO%: 0.9 % (ref 0.0–2.0)
Basophils Absolute: 0.1 10*3/uL (ref 0.0–0.1)
EOS%: 6.4 % (ref 0.0–7.0)
Eosinophils Absolute: 0.5 10*3/uL (ref 0.0–0.5)
HEMATOCRIT: 30.8 % — AB (ref 34.8–46.6)
HGB: 10 g/dL — ABNORMAL LOW (ref 11.6–15.9)
LYMPH#: 1.8 10*3/uL (ref 0.9–3.3)
LYMPH%: 22.4 % (ref 14.0–49.7)
MCH: 28.7 pg (ref 25.1–34.0)
MCHC: 32.4 g/dL (ref 31.5–36.0)
MCV: 88.6 fL (ref 79.5–101.0)
MONO#: 0.4 10*3/uL (ref 0.1–0.9)
MONO%: 4.6 % (ref 0.0–14.0)
NEUT#: 5.1 10*3/uL (ref 1.5–6.5)
NEUT%: 65.7 % (ref 38.4–76.8)
Platelets: 215 10*3/uL (ref 145–400)
RBC: 3.48 10*6/uL — ABNORMAL LOW (ref 3.70–5.45)
RDW: 14.4 % (ref 11.2–14.5)
WBC: 7.8 10*3/uL (ref 3.9–10.3)

## 2013-05-12 LAB — COMPREHENSIVE METABOLIC PANEL (CC13)
ALK PHOS: 79 U/L (ref 40–150)
ALT: 13 U/L (ref 0–55)
AST: 9 U/L (ref 5–34)
Albumin: 3.3 g/dL — ABNORMAL LOW (ref 3.5–5.0)
Anion Gap: 11 mEq/L (ref 3–11)
BUN: 24.8 mg/dL (ref 7.0–26.0)
CALCIUM: 9.7 mg/dL (ref 8.4–10.4)
CO2: 24 mEq/L (ref 22–29)
CREATININE: 1.7 mg/dL — AB (ref 0.6–1.1)
Chloride: 106 mEq/L (ref 98–109)
Glucose: 152 mg/dl — ABNORMAL HIGH (ref 70–140)
Potassium: 4.7 mEq/L (ref 3.5–5.1)
Sodium: 141 mEq/L (ref 136–145)
Total Protein: 6.5 g/dL (ref 6.4–8.3)

## 2013-05-12 LAB — RETICULOCYTES
IMMATURE RETIC FRACT: 6.5 % (ref 1.60–10.00)
RBC: 3.67 10*6/uL — ABNORMAL LOW (ref 3.70–5.45)
RETIC CT ABS: 80.01 10*3/uL (ref 33.70–90.70)
Retic %: 2.18 % — ABNORMAL HIGH (ref 0.70–2.10)

## 2013-05-12 LAB — TSH CHCC: TSH: 4.416 m[IU]/L — AB (ref 0.308–3.960)

## 2013-05-12 LAB — FERRITIN CHCC: FERRITIN: 26 ng/mL (ref 9–269)

## 2013-05-12 NOTE — Telephone Encounter (Signed)
m
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
## 2013-05-13 ENCOUNTER — Other Ambulatory Visit: Payer: Self-pay

## 2013-05-13 ENCOUNTER — Other Ambulatory Visit: Payer: Self-pay | Admitting: Hematology and Oncology

## 2013-05-13 ENCOUNTER — Telehealth: Payer: Self-pay

## 2013-05-13 DIAGNOSIS — K625 Hemorrhage of anus and rectum: Secondary | ICD-10-CM

## 2013-05-13 NOTE — Telephone Encounter (Signed)
Called patient to inform her Dr.Faidas would like her to come pick up a hemacult test to take home to test further for her Anemia. Informed patient she can pick this up from the lab at the cancer center and mail it back. Patient verbalized understanding understanding, denies any questions or concerns at this time. Patient informed to call back with any further questions or concerns.

## 2013-05-16 LAB — SPEP & IFE WITH QIG
ALBUMIN ELP: 58.2 % (ref 55.8–66.1)
ALPHA-1-GLOBULIN: 6 % — AB (ref 2.9–4.9)
Alpha-2-Globulin: 13 % — ABNORMAL HIGH (ref 7.1–11.8)
BETA GLOBULIN: 8.1 % — AB (ref 4.7–7.2)
Beta 2: 5.1 % (ref 3.2–6.5)
Gamma Globulin: 9.6 % — ABNORMAL LOW (ref 11.1–18.8)
IGA: 148 mg/dL (ref 69–380)
IgG (Immunoglobin G), Serum: 735 mg/dL (ref 690–1700)
IgM, Serum: 41 mg/dL — ABNORMAL LOW (ref 52–322)
TOTAL PROTEIN, SERUM ELECTROPHOR: 6.5 g/dL (ref 6.0–8.3)

## 2013-05-16 LAB — VITAMIN D 25 HYDROXY (VIT D DEFICIENCY, FRACTURES): Vit D, 25-Hydroxy: 40 ng/mL (ref 30–89)

## 2013-05-16 LAB — ERYTHROPOIETIN: ERYTHROPOIETIN: 14.6 m[IU]/mL (ref 2.6–18.5)

## 2013-05-16 LAB — VITAMIN B12: Vitamin B-12: 1045 pg/mL — ABNORMAL HIGH (ref 211–911)

## 2013-05-16 LAB — SEDIMENTATION RATE: Sed Rate: 42 mm/hr — ABNORMAL HIGH (ref 0–22)

## 2013-05-17 ENCOUNTER — Other Ambulatory Visit: Payer: Self-pay | Admitting: Oncology

## 2013-05-18 ENCOUNTER — Telehealth: Payer: Self-pay | Admitting: Oncology

## 2013-05-18 ENCOUNTER — Other Ambulatory Visit: Payer: Self-pay | Admitting: Oncology

## 2013-05-18 DIAGNOSIS — D631 Anemia in chronic kidney disease: Secondary | ICD-10-CM

## 2013-05-18 DIAGNOSIS — N039 Chronic nephritic syndrome with unspecified morphologic changes: Principal | ICD-10-CM

## 2013-05-18 NOTE — Telephone Encounter (Signed)
, °

## 2013-05-19 ENCOUNTER — Telehealth: Payer: Self-pay | Admitting: *Deleted

## 2013-05-19 NOTE — Telephone Encounter (Signed)
Called pt on cell phone and left message on voice mail re:  Per Dr. Jana Hakim -  Lab results showed  No  Evidence of  Cancer ( Myeloma ), and pt also has excellent  Vit D level.

## 2013-05-20 ENCOUNTER — Ambulatory Visit (HOSPITAL_COMMUNITY): Payer: Medicare Other

## 2013-05-27 ENCOUNTER — Other Ambulatory Visit: Payer: Medicare Other

## 2013-05-27 ENCOUNTER — Ambulatory Visit: Payer: Medicare Other

## 2013-05-27 ENCOUNTER — Ambulatory Visit (HOSPITAL_COMMUNITY)
Admission: RE | Admit: 2013-05-27 | Discharge: 2013-05-27 | Disposition: A | Discharge status: Home / Self Care | Payer: Medicare Other | Source: Ambulatory Visit | Attending: Hematology and Oncology | Admitting: Hematology and Oncology

## 2013-05-27 DIAGNOSIS — C50411 Malignant neoplasm of upper-outer quadrant of right female breast: Secondary | ICD-10-CM

## 2013-05-27 DIAGNOSIS — D649 Anemia, unspecified: Secondary | ICD-10-CM | POA: Insufficient documentation

## 2013-05-27 DIAGNOSIS — C50919 Malignant neoplasm of unspecified site of unspecified female breast: Secondary | ICD-10-CM | POA: Insufficient documentation

## 2013-05-27 DIAGNOSIS — E039 Hypothyroidism, unspecified: Secondary | ICD-10-CM

## 2013-05-27 DIAGNOSIS — R109 Unspecified abdominal pain: Secondary | ICD-10-CM | POA: Insufficient documentation

## 2013-05-27 DIAGNOSIS — N289 Disorder of kidney and ureter, unspecified: Secondary | ICD-10-CM

## 2013-05-31 ENCOUNTER — Ambulatory Visit: Payer: Medicare Other | Admitting: Oncology

## 2013-05-31 ENCOUNTER — Other Ambulatory Visit: Payer: Medicare Other

## 2013-06-27 ENCOUNTER — Ambulatory Visit (HOSPITAL_BASED_OUTPATIENT_CLINIC_OR_DEPARTMENT_OTHER): Payer: Medicare Other | Admitting: Oncology

## 2013-06-27 ENCOUNTER — Other Ambulatory Visit (HOSPITAL_BASED_OUTPATIENT_CLINIC_OR_DEPARTMENT_OTHER): Payer: Medicare Other

## 2013-06-27 ENCOUNTER — Telehealth: Payer: Self-pay | Admitting: Oncology

## 2013-06-27 VITALS — BP 137/75 | HR 120 | Temp 98.5°F | Resp 18 | Ht 65.0 in | Wt 308.8 lb

## 2013-06-27 DIAGNOSIS — N039 Chronic nephritic syndrome with unspecified morphologic changes: Principal | ICD-10-CM

## 2013-06-27 DIAGNOSIS — D649 Anemia, unspecified: Secondary | ICD-10-CM

## 2013-06-27 DIAGNOSIS — C50411 Malignant neoplasm of upper-outer quadrant of right female breast: Secondary | ICD-10-CM

## 2013-06-27 DIAGNOSIS — D0590 Unspecified type of carcinoma in situ of unspecified breast: Secondary | ICD-10-CM

## 2013-06-27 DIAGNOSIS — Z170 Estrogen receptor positive status [ER+]: Secondary | ICD-10-CM

## 2013-06-27 DIAGNOSIS — D631 Anemia in chronic kidney disease: Secondary | ICD-10-CM

## 2013-06-27 LAB — CBC & DIFF AND RETIC
BASO%: 0.5 % (ref 0.0–2.0)
Basophils Absolute: 0.1 10*3/uL (ref 0.0–0.1)
EOS%: 8.1 % — ABNORMAL HIGH (ref 0.0–7.0)
Eosinophils Absolute: 1 10*3/uL — ABNORMAL HIGH (ref 0.0–0.5)
HEMATOCRIT: 33.6 % — AB (ref 34.8–46.6)
HGB: 11.1 g/dL — ABNORMAL LOW (ref 11.6–15.9)
Immature Retic Fract: 5.1 % (ref 1.60–10.00)
LYMPH#: 2.8 10*3/uL (ref 0.9–3.3)
LYMPH%: 22.4 % (ref 14.0–49.7)
MCH: 28 pg (ref 25.1–34.0)
MCHC: 33 g/dL (ref 31.5–36.0)
MCV: 84.8 fL (ref 79.5–101.0)
MONO#: 0.7 10*3/uL (ref 0.1–0.9)
MONO%: 5.2 % (ref 0.0–14.0)
NEUT#: 7.9 10*3/uL — ABNORMAL HIGH (ref 1.5–6.5)
NEUT%: 63.8 % (ref 38.4–76.8)
Platelets: 260 10*3/uL (ref 145–400)
RBC: 3.96 10*6/uL (ref 3.70–5.45)
RDW: 14.2 % (ref 11.2–14.5)
Retic %: 1.66 % (ref 0.70–2.10)
Retic Ct Abs: 65.74 10*3/uL (ref 33.70–90.70)
WBC: 12.4 10*3/uL — ABNORMAL HIGH (ref 3.9–10.3)
nRBC: 0 % (ref 0–0)
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
## 2013-06-27 NOTE — Telephone Encounter (Signed)
,

## 2013-06-27 NOTE — Addendum Note (Signed)
Addended by: Laureen Abrahams on: 06/27/2013 05:31 PM   Modules accepted: Medications

## 2013-06-30 ENCOUNTER — Ambulatory Visit: Payer: Medicare Other | Admitting: Neurology

## 2013-08-08 ENCOUNTER — Inpatient Hospital Stay (HOSPITAL_COMMUNITY): Payer: Medicare Other

## 2013-08-08 ENCOUNTER — Other Ambulatory Visit: Payer: Self-pay | Admitting: Physician Assistant

## 2013-08-08 ENCOUNTER — Emergency Department (HOSPITAL_COMMUNITY): Payer: Medicare Other

## 2013-08-08 ENCOUNTER — Encounter (HOSPITAL_COMMUNITY): Payer: Self-pay | Admitting: *Deleted

## 2013-08-08 ENCOUNTER — Inpatient Hospital Stay (HOSPITAL_COMMUNITY)
Admission: EM | Admit: 2013-08-08 | Discharge: 2013-08-12 | DRG: 871 | Disposition: A | Discharge status: Home Health | Payer: Medicare Other | Attending: Internal Medicine | Admitting: Internal Medicine

## 2013-08-08 DIAGNOSIS — E1149 Type 2 diabetes mellitus with other diabetic neurological complication: Secondary | ICD-10-CM | POA: Diagnosis present

## 2013-08-08 DIAGNOSIS — K219 Gastro-esophageal reflux disease without esophagitis: Secondary | ICD-10-CM

## 2013-08-08 DIAGNOSIS — E1142 Type 2 diabetes mellitus with diabetic polyneuropathy: Secondary | ICD-10-CM | POA: Diagnosis present

## 2013-08-08 DIAGNOSIS — R6520 Severe sepsis without septic shock: Secondary | ICD-10-CM

## 2013-08-08 DIAGNOSIS — Z923 Personal history of irradiation: Secondary | ICD-10-CM | POA: Diagnosis not present

## 2013-08-08 DIAGNOSIS — Z9089 Acquired absence of other organs: Secondary | ICD-10-CM | POA: Diagnosis not present

## 2013-08-08 DIAGNOSIS — G219 Secondary parkinsonism, unspecified: Secondary | ICD-10-CM

## 2013-08-08 DIAGNOSIS — Z96659 Presence of unspecified artificial knee joint: Secondary | ICD-10-CM

## 2013-08-08 DIAGNOSIS — E861 Hypovolemia: Secondary | ICD-10-CM | POA: Diagnosis not present

## 2013-08-08 DIAGNOSIS — I129 Hypertensive chronic kidney disease with stage 1 through stage 4 chronic kidney disease, or unspecified chronic kidney disease: Secondary | ICD-10-CM | POA: Diagnosis present

## 2013-08-08 DIAGNOSIS — Z87891 Personal history of nicotine dependence: Secondary | ICD-10-CM

## 2013-08-08 DIAGNOSIS — J45909 Unspecified asthma, uncomplicated: Secondary | ICD-10-CM

## 2013-08-08 DIAGNOSIS — E039 Hypothyroidism, unspecified: Secondary | ICD-10-CM | POA: Diagnosis present

## 2013-08-08 DIAGNOSIS — E785 Hyperlipidemia, unspecified: Secondary | ICD-10-CM

## 2013-08-08 DIAGNOSIS — Z853 Personal history of malignant neoplasm of breast: Secondary | ICD-10-CM

## 2013-08-08 DIAGNOSIS — Z803 Family history of malignant neoplasm of breast: Secondary | ICD-10-CM | POA: Diagnosis not present

## 2013-08-08 DIAGNOSIS — E669 Obesity, unspecified: Secondary | ICD-10-CM

## 2013-08-08 DIAGNOSIS — F411 Generalized anxiety disorder: Secondary | ICD-10-CM | POA: Diagnosis present

## 2013-08-08 DIAGNOSIS — Z91038 Other insect allergy status: Secondary | ICD-10-CM

## 2013-08-08 DIAGNOSIS — B009 Herpesviral infection, unspecified: Secondary | ICD-10-CM

## 2013-08-08 DIAGNOSIS — Z8249 Family history of ischemic heart disease and other diseases of the circulatory system: Secondary | ICD-10-CM

## 2013-08-08 DIAGNOSIS — R0902 Hypoxemia: Secondary | ICD-10-CM | POA: Diagnosis present

## 2013-08-08 DIAGNOSIS — R072 Precordial pain: Secondary | ICD-10-CM | POA: Diagnosis present

## 2013-08-08 DIAGNOSIS — A419 Sepsis, unspecified organism: Secondary | ICD-10-CM | POA: Diagnosis present

## 2013-08-08 DIAGNOSIS — N179 Acute kidney failure, unspecified: Secondary | ICD-10-CM | POA: Diagnosis present

## 2013-08-08 DIAGNOSIS — Z885 Allergy status to narcotic agent status: Secondary | ICD-10-CM

## 2013-08-08 DIAGNOSIS — I959 Hypotension, unspecified: Secondary | ICD-10-CM | POA: Diagnosis present

## 2013-08-08 DIAGNOSIS — Z808 Family history of malignant neoplasm of other organs or systems: Secondary | ICD-10-CM | POA: Diagnosis not present

## 2013-08-08 DIAGNOSIS — D638 Anemia in other chronic diseases classified elsewhere: Secondary | ICD-10-CM | POA: Diagnosis present

## 2013-08-08 DIAGNOSIS — R471 Dysarthria and anarthria: Secondary | ICD-10-CM

## 2013-08-08 DIAGNOSIS — Z888 Allergy status to other drugs, medicaments and biological substances status: Secondary | ICD-10-CM

## 2013-08-08 DIAGNOSIS — R55 Syncope and collapse: Secondary | ICD-10-CM

## 2013-08-08 DIAGNOSIS — J9819 Other pulmonary collapse: Secondary | ICD-10-CM | POA: Diagnosis present

## 2013-08-08 DIAGNOSIS — G4733 Obstructive sleep apnea (adult) (pediatric): Secondary | ICD-10-CM | POA: Diagnosis present

## 2013-08-08 DIAGNOSIS — Z79899 Other long term (current) drug therapy: Secondary | ICD-10-CM

## 2013-08-08 DIAGNOSIS — I498 Other specified cardiac arrhythmias: Secondary | ICD-10-CM | POA: Diagnosis present

## 2013-08-08 DIAGNOSIS — Z8049 Family history of malignant neoplasm of other genital organs: Secondary | ICD-10-CM

## 2013-08-08 DIAGNOSIS — G894 Chronic pain syndrome: Secondary | ICD-10-CM | POA: Diagnosis present

## 2013-08-08 DIAGNOSIS — Z87442 Personal history of urinary calculi: Secondary | ICD-10-CM

## 2013-08-08 DIAGNOSIS — Z8042 Family history of malignant neoplasm of prostate: Secondary | ICD-10-CM

## 2013-08-08 DIAGNOSIS — R079 Chest pain, unspecified: Secondary | ICD-10-CM

## 2013-08-08 DIAGNOSIS — F329 Major depressive disorder, single episode, unspecified: Secondary | ICD-10-CM | POA: Diagnosis present

## 2013-08-08 DIAGNOSIS — F3289 Other specified depressive episodes: Secondary | ICD-10-CM | POA: Diagnosis present

## 2013-08-08 DIAGNOSIS — K625 Hemorrhage of anus and rectum: Secondary | ICD-10-CM

## 2013-08-08 DIAGNOSIS — Z794 Long term (current) use of insulin: Secondary | ICD-10-CM

## 2013-08-08 DIAGNOSIS — Z833 Family history of diabetes mellitus: Secondary | ICD-10-CM | POA: Diagnosis not present

## 2013-08-08 DIAGNOSIS — Z91013 Allergy to seafood: Secondary | ICD-10-CM

## 2013-08-08 DIAGNOSIS — N189 Chronic kidney disease, unspecified: Secondary | ICD-10-CM | POA: Diagnosis present

## 2013-08-08 DIAGNOSIS — R579 Shock, unspecified: Secondary | ICD-10-CM

## 2013-08-08 DIAGNOSIS — I10 Essential (primary) hypertension: Secondary | ICD-10-CM

## 2013-08-08 DIAGNOSIS — R6521 Severe sepsis with septic shock: Secondary | ICD-10-CM

## 2013-08-08 DIAGNOSIS — Z6841 Body Mass Index (BMI) 40.0 and over, adult: Secondary | ICD-10-CM | POA: Diagnosis not present

## 2013-08-08 DIAGNOSIS — M546 Pain in thoracic spine: Secondary | ICD-10-CM

## 2013-08-08 DIAGNOSIS — I950 Idiopathic hypotension: Secondary | ICD-10-CM

## 2013-08-08 DIAGNOSIS — C50411 Malignant neoplasm of upper-outer quadrant of right female breast: Secondary | ICD-10-CM

## 2013-08-08 LAB — I-STAT CHEM 8, ED
BUN: 39 mg/dL — ABNORMAL HIGH (ref 6–23)
BUN: 36 mg/dL — AB (ref 6–23)
CHLORIDE: 100 meq/L (ref 96–112)
CREATININE: 1.8 mg/dL — AB (ref 0.50–1.10)
CREATININE: 1.6 mg/dL — AB (ref 0.50–1.10)
Calcium, Ion: 1.41 mmol/L — ABNORMAL HIGH (ref 1.12–1.23)
Calcium, Ion: 1.3 mmol/L — ABNORMAL HIGH (ref 1.12–1.23)
Chloride: 99 mEq/L (ref 96–112)
GLUCOSE: 149 mg/dL — AB (ref 70–99)
Glucose, Bld: 185 mg/dL — ABNORMAL HIGH (ref 70–99)
HCT: 38 % (ref 36.0–46.0)
HCT: 32 % — ABNORMAL LOW (ref 36.0–46.0)
Hemoglobin: 12.9 g/dL (ref 12.0–15.0)
Hemoglobin: 10.9 g/dL — ABNORMAL LOW (ref 12.0–15.0)
POTASSIUM: 4.2 meq/L (ref 3.7–5.3)
Potassium: 4.3 mEq/L (ref 3.7–5.3)
SODIUM: 137 meq/L (ref 137–147)
Sodium: 136 mEq/L — ABNORMAL LOW (ref 137–147)
TCO2: 25 mmol/L (ref 0–100)
TCO2: 22 mmol/L (ref 0–100)

## 2013-08-08 LAB — BASIC METABOLIC PANEL
BUN: 41 mg/dL — ABNORMAL HIGH (ref 6–23)
CO2: 22 mEq/L (ref 19–32)
Calcium: 11 mg/dL — ABNORMAL HIGH (ref 8.4–10.5)
Chloride: 95 mEq/L — ABNORMAL LOW (ref 96–112)
Creatinine, Ser: 1.7 mg/dL — ABNORMAL HIGH (ref 0.50–1.10)
GFR calc Af Amer: 37 mL/min — ABNORMAL LOW (ref >90)
GFR calc non Af Amer: 32 mL/min — ABNORMAL LOW (ref >90)
GLUCOSE: 147 mg/dL — AB (ref 70–99)
POTASSIUM: 4.5 meq/L (ref 3.7–5.3)
Sodium: 137 mEq/L (ref 137–147)

## 2013-08-08 LAB — I-STAT CG4 LACTIC ACID, ED
Lactic Acid, Venous: 3.67 mmol/L — ABNORMAL HIGH (ref 0.5–2.2)
Lactic Acid, Venous: 3.71 mmol/L — ABNORMAL HIGH (ref 0.5–2.2)

## 2013-08-08 LAB — I-STAT ARTERIAL BLOOD GAS, ED
ACID-BASE EXCESS: 1 mmol/L (ref 0.0–2.0)
Bicarbonate: 24.7 mEq/L — ABNORMAL HIGH (ref 20.0–24.0)
O2 SAT: 98 %
PO2 ART: 95 mmHg (ref 80.0–100.0)
Patient temperature: 98.6
TCO2: 26 mmol/L (ref 0–100)
pCO2 arterial: 36.5 mmHg (ref 35.0–45.0)
pH, Arterial: 7.438 (ref 7.350–7.450)

## 2013-08-08 LAB — URINE MICROSCOPIC-ADD ON

## 2013-08-08 LAB — PRO B NATRIURETIC PEPTIDE: Pro B Natriuretic peptide (BNP): 155.5 pg/mL — ABNORMAL HIGH (ref 0–125)

## 2013-08-08 LAB — URINALYSIS, ROUTINE W REFLEX MICROSCOPIC
BILIRUBIN URINE: NEGATIVE
GLUCOSE, UA: 100 mg/dL — AB
Ketones, ur: NEGATIVE mg/dL
LEUKOCYTES UA: NEGATIVE
Nitrite: NEGATIVE
PH: 5 (ref 5.0–8.0)
Protein, ur: NEGATIVE mg/dL
Specific Gravity, Urine: 1.018 (ref 1.005–1.030)
Urobilinogen, UA: 0.2 mg/dL (ref 0.0–1.0)

## 2013-08-08 LAB — GLUCOSE, CAPILLARY: GLUCOSE-CAPILLARY: 274 mg/dL — AB (ref 70–99)

## 2013-08-08 LAB — I-STAT TROPONIN, ED: Troponin i, poc: 0.01 ng/mL (ref 0.00–0.08)

## 2013-08-08 LAB — CBC
HEMATOCRIT: 34.9 % — AB (ref 36.0–46.0)
HEMOGLOBIN: 11.3 g/dL — AB (ref 12.0–15.0)
MCH: 27.2 pg (ref 26.0–34.0)
MCHC: 32.4 g/dL (ref 30.0–36.0)
MCV: 84.1 fL (ref 78.0–100.0)
Platelets: 279 10*3/uL (ref 150–400)
RBC: 4.15 MIL/uL (ref 3.87–5.11)
RDW: 15.7 % — ABNORMAL HIGH (ref 11.5–15.5)
WBC: 24.5 10*3/uL — ABNORMAL HIGH (ref 4.0–10.5)

## 2013-08-08 LAB — MRSA PCR SCREENING: MRSA by PCR: NEGATIVE

## 2013-08-08 LAB — D-DIMER, QUANTITATIVE: D-Dimer, Quant: 0.76 ug/mL-FEU — ABNORMAL HIGH (ref 0.00–0.48)

## 2013-08-08 LAB — TROPONIN I: Troponin I: <0.3 ng/mL (ref <0.30)

## 2013-08-08 MED ORDER — PIPERACILLIN-TAZOBACTAM 3.375 G IVPB
3.3750 g | Freq: Three times a day (TID) | INTRAVENOUS | Status: DC
  Administered 2013-08-08 – 2013-08-12 (×11): 3.375 g via INTRAVENOUS
  Filled 2013-08-08 (×16): qty 50

## 2013-08-08 MED ORDER — VANCOMYCIN HCL 10 G IV SOLR
1500.0000 mg | Freq: Once | INTRAVENOUS | Status: AC
  Administered 2013-08-08: 1500 mg via INTRAVENOUS
  Filled 2013-08-08: qty 1500

## 2013-08-08 MED ORDER — SODIUM CHLORIDE 0.9 % IV BOLUS (SEPSIS)
1000.0000 mL | Freq: Once | INTRAVENOUS | Status: AC
  Administered 2013-08-08: 1000 mL via INTRAVENOUS

## 2013-08-08 MED ORDER — SODIUM CHLORIDE 0.9 % IV BOLUS (SEPSIS): 1000.0000 mL | INTRAVENOUS | Status: DC | PRN

## 2013-08-08 MED ORDER — HEPARIN (PORCINE) IN NACL 100-0.45 UNIT/ML-% IJ SOLN
1500.0000 [IU]/h | INTRAMUSCULAR | Status: DC
  Filled 2013-08-08: qty 250

## 2013-08-08 MED ORDER — ASPIRIN 325 MG PO TABS
325.0000 mg | ORAL_TABLET | ORAL | Status: AC
  Administered 2013-08-08: 325 mg via ORAL
  Filled 2013-08-08: qty 1

## 2013-08-08 MED ORDER — SODIUM CHLORIDE 0.9 % IV SOLN
INTRAVENOUS | Status: DC
  Administered 2013-08-08 – 2013-08-11 (×2): via INTRAVENOUS
  Administered 2013-08-12: 1000 mL via INTRAVENOUS

## 2013-08-08 MED ORDER — VANCOMYCIN HCL 10 G IV SOLR
2000.0000 mg | INTRAVENOUS | Status: DC
  Filled 2013-08-08: qty 2000

## 2013-08-08 MED ORDER — NOREPINEPHRINE BITARTRATE 1 MG/ML IV SOLN
2.0000 ug/min | Freq: Once | INTRAVENOUS | Status: AC
  Administered 2013-08-08: 5 ug/min via INTRAVENOUS
  Filled 2013-08-08: qty 4

## 2013-08-08 MED ORDER — SODIUM CHLORIDE 0.9 % IV SOLN: 250.0000 mL | INTRAVENOUS | Status: DC | PRN

## 2013-08-08 MED ORDER — SODIUM CHLORIDE 0.9 % IV BOLUS (SEPSIS)
1000.0000 mL | Freq: Once | INTRAVENOUS | Status: AC
Start: 2013-08-08 — End: 2013-08-08
  Administered 2013-08-08: 1000 mL via INTRAVENOUS

## 2013-08-08 MED ORDER — PIPERACILLIN-TAZOBACTAM 3.375 G IVPB
3.3750 g | Freq: Once | INTRAVENOUS | Status: AC
  Administered 2013-08-08: 3.375 g via INTRAVENOUS
  Filled 2013-08-08: qty 50

## 2013-08-08 MED ORDER — VANCOMYCIN HCL IN DEXTROSE 1-5 GM/200ML-% IV SOLN
1000.0000 mg | Freq: Once | INTRAVENOUS | Status: AC
  Administered 2013-08-08: 1000 mg via INTRAVENOUS
  Filled 2013-08-08: qty 200

## 2013-08-08 MED ORDER — TECHNETIUM TO 99M ALBUMIN AGGREGATED
6.0000 | Freq: Once | INTRAVENOUS | Status: AC | PRN
  Administered 2013-08-08: 6 via INTRAVENOUS

## 2013-08-08 MED ORDER — METHYLPREDNISOLONE SODIUM SUCC 125 MG IJ SOLR
125.0000 mg | Freq: Once | INTRAMUSCULAR | Status: AC
  Administered 2013-08-08: 125 mg via INTRAVENOUS
  Filled 2013-08-08: qty 2

## 2013-08-08 MED ORDER — HEPARIN SODIUM (PORCINE) 5000 UNIT/ML IJ SOLN
5000.0000 [IU] | Freq: Three times a day (TID) | INTRAMUSCULAR | Status: DC
Start: 2013-08-08 — End: 2013-08-12
  Administered 2013-08-08 – 2013-08-12 (×11): 5000 [IU] via SUBCUTANEOUS
  Filled 2013-08-08 (×14): qty 1

## 2013-08-08 MED ORDER — TECHNETIUM TC 99M DIETHYLENETRIAME-PENTAACETIC ACID: 40.0000 | Freq: Once | INTRAVENOUS | Status: AC | PRN

## 2013-08-08 MED ORDER — HEPARIN BOLUS VIA INFUSION
5000.0000 [IU] | Freq: Once | INTRAVENOUS | Status: DC
  Filled 2013-08-08: qty 5000

## 2013-08-08 MED ORDER — DEXTROSE 5 % IV SOLN
5.0000 ug/min | INTRAVENOUS | Status: DC
  Administered 2013-08-08: 4 ug/min via INTRAVENOUS
  Filled 2013-08-08: qty 4

## 2013-08-08 NOTE — ED Notes (Signed)
i-stat CG4 result given to Dr. Wyvonnia Dusky

## 2013-08-08 NOTE — ED Provider Notes (Signed)
CSN: [id number]     Arrival date & time 08/08/13  1521 History   First MD Initiated Contact with Patient 08/08/13 1552     Chief Complaint  Patient presents with  . Shortness of Breath  . Chest Pain     (Consider location/radiation/quality/duration/timing/severity/associated sxs/prior Treatment) HPI Comments: Patient from PCPs office with 3 day history of constant left-sided chest pain it radiates to her upper back. Associated with shortness of breath. She thought she was having an asthma exacerbation. She is tachycardic and hypotensive. She is not hypoxic. She was given albuterol and aspirin without relief. Denies any fever, nausea, vomiting or diarrhea. She has a history of breast cancer is in remission. She is a diabetic with history of hypertension. She endorses dizziness and lightheadedness. She denies any nausea or vomiting.  The history is provided by the patient and the EMS personnel. The history is limited by the condition of the patient.    Past Medical History  Diagnosis Date  . Depression   . Thyroid disease   . GERD (gastroesophageal reflux disease)   . Pancreatitis     hx of  . Knee pain, bilateral   . Obesity   . HSV (herpes simplex virus) infection   . Hyperlipemia   . Asthma   . Tachycardia     with sx in 2008  . Breast mass in female     L breast 2008, US showed likely fat necrosis  . Arthritis   . Anxiety   . Heartburn   . Hypertension     sees Dr. Criss Rosales , [facility]  . Hx of radiation therapy 05/05/12- 07/15/12    right breast, 45 gray x 25 fx, lumpectomy cavity boosted to 16.2 gray  . Sleep apnea     uses cpap, pt does not know settings  . History of kidney stones   . Headache(784.0)   . 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  . Anemia   . History of blood transfusion 2011  . Hypothyroidism   . Elevated glucose     pt denies diabetes glucose elevated on cmet glucose  08-03-2012  . 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 Polyneuropathy in diabetes(357.2)   . Benign paroxysmal positional vertigo 12/30/2012   Past Surgical History  Procedure Laterality Date  . Replacement total knee bilateral Bilateral yrs ago  . Foot surgery Right 10 yrs ago  . Abdominal surgery    . Thyroid surgery  yrs ago  . Joint replacement    . Abdominal hysterectomy  yrs ago    partial  . Breast surgery Right   . Breast lumpectomy  03/03/2012    Procedure: LUMPECTOMY;  Surgeon: Haywood Lasso, MD;  Location: [facility];  Service: General;  Laterality: Right;  . Cholecystectomy  yrs ago  . Colonoscopy with propofol N/A 09/28/2012    Procedure: COLONOSCOPY WITH PROPOFOL;  Surgeon: Lear Ng, MD;  Location: [facility];  Service: Endoscopy;  Laterality: N/A;   Family History  Problem Relation Age of Onset  . Heart disease Brother   . Diabetes Brother   . Hypertension Mother   . Diabetes Mother   . Breast cancer Mother 53  . Bone cancer Mother   . Hypertension Sister   . Diabetes Sister   . Breast cancer Sister 7  . Breast cancer Maternal Grandmother   . Heart disease Maternal Grandmother   . Uterine cancer Other 19  .  Breast cancer Paternal Aunt 59  . Breast cancer Paternal Grandmother     dx in her 44s  . Prostate cancer Paternal Grandfather    History  Substance Use Topics  . Smoking status: Former Smoker -- 1.00 packs/day for 7 years    Types: Cigarettes    Quit date: 03/24/1992  . Smokeless tobacco: Never Used  . Alcohol Use: No   OB History   Grav Para Term Preterm Abortions TAB SAB Ect Mult Living                 Review of Systems  Unable to perform ROS: Acuity of condition  Constitutional: Positive for activity change and appetite change.  Respiratory: Positive for shortness of breath.   Cardiovascular: Positive for chest pain.      Allergies  Shellfish allergy; Bee venom; Morphine and related; and Oxycodone hcl  Home Medications   Prior to Admission  medications   Medication Sig Start Date End Date Taking? Authorizing Provider  anastrozole (ARIMIDEX) 1 MG tablet Take 1 mg by mouth at bedtime. 08/13/12  Yes Amy Milda Smart, PA-C  ARIPiprazole (ABILIFY) 10 MG tablet Take 10 mg by mouth daily.    Yes Historical Provider, MD  CALCIUM PO Take 1 tablet by mouth daily.    Yes Historical Provider, MD  cholecalciferol (VITAMIN D) 1000 UNITS tablet Take 1 tablet (1,000 Units total) by mouth daily. 11/18/12  Yes Chauncey Cruel, MD  desonide (DESOWEN) 0.05 % ointment Apply 1 application topically 2 (two) times daily.   Yes Historical Provider, MD  diclofenac sodium (VOLTAREN) 1 % GEL Apply 2 g topically 4 (four) times daily.   Yes Historical Provider, MD  esomeprazole (NEXIUM) 40 MG capsule Take 40 mg by mouth daily before breakfast.   Yes Historical Provider, MD  fluticasone (CUTIVATE) 0.05 % cream Apply 1 application topically 2 (two) times daily.   Yes Historical Provider, MD  gabapentin (NEURONTIN) 100 MG capsule Take 100-300 mg by mouth 2 (two) times daily. takes 121m at breakfast, 106mat lunch and 30028mt bedtime 12/20/12  Yes Historical Provider, MD  insulin aspart (NOVOLOG) 100 UNIT/ML injection Inject 2 Units into the skin 3 (three) times daily before meals. Sliding scale   Yes Historical Provider, MD  Insulin Detemir (LEVEMIR FLEXPEN) 100 UNIT/ML Pen Inject 15 Units into the skin at bedtime.   Yes Historical Provider, MD  levothyroxine (SYNTHROID, LEVOTHROID) 200 MCG tablet Take 200 mcg by mouth daily before breakfast.    Yes Historical Provider, MD  levothyroxine (SYNTHROID, LEVOTHROID) 25 MCG tablet Take 25 mcg by mouth daily before breakfast.   Yes Historical Provider, MD  lisinopril (PRINIVIL,ZESTRIL) 2.5 MG tablet Take 2.5 mg by mouth daily. 12/20/12  Yes Historical Provider, MD  meloxicam (MOBIC) 15 MG tablet Take 15 mg by mouth daily.    Yes Historical Provider, MD  Multiple Vitamin (MULTIVITAMIN WITH MINERALS) TABS Take 1 tablet by mouth  daily.   Yes Historical Provider, MD  PARoxetine (PAXIL) 20 MG tablet Take 20 mg by mouth daily.    Yes Historical Provider, MD  rosuvastatin (CRESTOR) 20 MG tablet Take 20 mg by mouth daily.   Yes Historical Provider, MD  sitaGLIPtin-metformin (JANUMET) 50-500 MG per tablet Take 1 tablet by mouth 2 (two) times daily with a meal.   Yes Historical Provider, MD  traMADol (ULTRAM) 50 MG tablet Take 50 mg by mouth every 6 (six) hours as needed for moderate pain.  05/30/13  Yes Historical Provider, MD  traZODone (DESYREL) 100 MG tablet Take 100 mg by mouth at bedtime.   Yes Historical Provider, MD  valACYclovir (VALTREX) 1000 MG tablet Take 1,000 mg by mouth daily.   Yes Historical Provider, MD  valsartan-hydrochlorothiazide (DIOVAN-HCT) 160-12.5 MG per tablet Take 1 tablet by mouth daily.  02/08/13  Yes Historical Provider, MD  zolpidem (AMBIEN) 10 MG tablet Take 10 mg by mouth at bedtime as needed for sleep.    Yes Historical Provider, MD   BP 119/64  Pulse 102  Temp(Src) 98.7 F (37.1 C) (Oral)  Resp 11  Ht _0  (1.651 m)  Wt 309 lb 11.9 oz (140.5 kg)  BMI 51.54 kg/m2  SpO2 97% Physical Exam  Nursing note and vitals reviewed. Constitutional: She is oriented to person, place, and time. She appears well-developed and well-nourished. She appears distressed.  Mildly increased work of breathing  HENT:  Head: Normocephalic and atraumatic.  Mouth/Throat: Oropharynx is clear and moist. No oropharyngeal exudate.  Eyes: Conjunctivae and EOM are normal. Pupils are equal, round, and reactive to light.  Neck: Normal range of motion. Neck supple.  No meningismus.  Cardiovascular: Normal rate, normal heart sounds and intact distal pulses.   No murmur heard. Tachycardic  Pulmonary/Chest: Effort normal and breath sounds normal. No respiratory distress. She exhibits tenderness.  Left-sided chest pain  Abdominal: Soft. There is no tenderness. There is no rebound and no guarding.  Musculoskeletal:  Normal range of motion. She exhibits no edema and no tenderness.  Neurological: She is alert and oriented to person, place, and time. No cranial nerve deficit. She exhibits normal muscle tone. Coordination normal.  No ataxia on finger to nose bilaterally. No pronator drift. 5/5 strength throughout. CN 2-12 intact. Negative Romberg. Equal grip strength. Sensation intact. Gait is normal.   Skin: Skin is warm.  Psychiatric: She has a normal mood and affect. Her behavior is normal.    ED Course  Procedures (including critical care time) Labs Review Labs Reviewed  PRO B NATRIURETIC PEPTIDE - Abnormal; Notable for the following:    Pro B Natriuretic peptide (BNP) 155.5 (*)    All other components within normal limits  BASIC METABOLIC PANEL - Abnormal; Notable for the following:    Chloride 95 (*)    Glucose, Bld 147 (*)    BUN 41 (*)    Creatinine, Ser 1.70 (*)    Calcium 11.0 (*)    GFR calc non Af Amer 32 (*)    GFR calc Af Amer 37 (*)    All other components within normal limits  CBC - Abnormal; Notable for the following:    WBC 24.5 (*)    Hemoglobin 11.3 (*)    HCT 34.9 (*)    RDW 15.7 (*)    All other components within normal limits  URINALYSIS, ROUTINE W REFLEX MICROSCOPIC - Abnormal; Notable for the following:    Glucose, UA 100 (*)    Hgb urine dipstick SMALL (*)    All other components within normal limits  D-DIMER, QUANTITATIVE - Abnormal; Notable for the following:    D-Dimer, Quant 0.76 (*)    All other components within normal limits  GLUCOSE, CAPILLARY - Abnormal; Notable for the following:    Glucose-Capillary 274 (*)    All other components within normal limits  URINE MICROSCOPIC-ADD ON - Abnormal; Notable for the following:    Squamous Epithelial / LPF MANY (*)    Crystals CA OXALATE CRYSTALS (*)    All other components within normal  limits  I-STAT CHEM 8, ED - Abnormal; Notable for the following:    Sodium 136 (*)    BUN 39 (*)    Creatinine, Ser 1.80 (*)     Glucose, Bld 149 (*)    Calcium, Ion 1.41 (*)    All other components within normal limits  I-STAT ARTERIAL BLOOD GAS, ED - Abnormal; Notable for the following:    Bicarbonate 24.7 (*)    All other components within normal limits  I-STAT CG4 LACTIC ACID, ED - Abnormal; Notable for the following:    Lactic Acid, Venous 3.67 (*)    All other components within normal limits  I-STAT CG4 LACTIC ACID, ED - Abnormal; Notable for the following:    Lactic Acid, Venous 3.71 (*)    All other components within normal limits  I-STAT CHEM 8, ED - Abnormal; Notable for the following:    BUN 36 (*)    Creatinine, Ser 1.60 (*)    Glucose, Bld 185 (*)    Calcium, Ion 1.30 (*)    Hemoglobin 10.9 (*)    HCT 32.0 (*)    All other components within normal limits  MRSA PCR SCREENING  CULTURE, BLOOD (ROUTINE X 2)  CULTURE, BLOOD (ROUTINE X 2)  URINE CULTURE  TROPONIN I  CBC  BASIC METABOLIC PANEL  PROCALCITONIN  TROPONIN I  TROPONIN I  CORTISOL  I-STAT TROPOININ, ED    Imaging Review Ct Chest Wo Contrast  08/08/2013   CLINICAL DATA:  Substernal chest pain.  Hypotension.  Leukocytosis.  EXAM: CT CHEST WITHOUT CONTRAST  TECHNIQUE: Multidetector CT imaging of the chest was performed following the standard protocol without IV contrast.  COMPARISON:  Multiple exams, including 02/19/2013  FINDINGS: No pathologic thoracic adenopathy. Dependent subsegmental atelectasis in both lungs. No acute thoracic spine findings. Coronary atherosclerotic calcification noted. A right central line terminates in the SVC.  IMPRESSION: 1. Dependent subsegmental atelectasis in both lungs. 2. Coronary atherosclerosis.   Electronically Signed   By: Sherryl Barters M.D.   On: 08/08/2013 21:26   Nm Pulmonary Perf And Vent  08/08/2013   CLINICAL DATA:  Shortness of breath for 1 week. Chest pain. History of asthma.  EXAM: NUCLEAR MEDICINE VENTILATION - PERFUSION LUNG SCAN  TECHNIQUE: Ventilation images were obtained in multiple  projections using inhaled aerosol technetium 99 M DTPA. Perfusion images were obtained in multiple projections after intravenous injection of Tc-2mMAA.  RADIOPHARMACEUTICALS:  40 mCi Tc-966mTPA aerosol and 6 mCi Tc-9955mA  COMPARISON:  Portable chest 08/08/2013  FINDINGS: Ventilation: Homogeneous but diffusely decreased of distribution of activity throughout the lungs. Residual activity demonstrated in the esophagus and stomach. No focal filling defects.  Perfusion: No wedge shaped peripheral perfusion defects to suggest acute pulmonary embolism.  IMPRESSION: Low probability of pulmonary embolus.   Electronically Signed   By: WilLucienne CapersD.   On: 08/08/2013 21:47   Dg Chest Portable 1 View  08/08/2013   CLINICAL DATA:  Central line placement.  Shortness of breath.  EXAM: PORTABLE CHEST - 1 VIEW  COMPARISON:  Earlier today at 1610 hr.  FINDINGS: 1930 hr. Right internal jugular line terminates at the mid SVC. No pneumothorax.  Surgical changes of the right side of the thoracic inlet. Normal heart size. The inferior lateral left hemi thorax is excluded. Given this factor, no pleural fluid. Low lung volumes with resultant pulmonary interstitial prominence.  IMPRESSION: Right internal jugular line terminating at the mid SVC. No pneumothorax or other acute disease.  Partial exclusion of the inferior lateral left hemi thorax.   Electronically Signed   By: Abigail Miyamoto M.D.   On: 08/08/2013 19:44   Dg Chest Portable 1 View  08/08/2013   CLINICAL DATA:  SHORTNESS OF BREATH CHEST PAIN  EXAM: PORTABLE CHEST - 1 VIEW  COMPARISON:  Two-view chest in 02/18/2013  FINDINGS: Low lung volumes. The heart size and mediastinal contours are within normal limits. Both lungs are clear. The visualized skeletal structures are unremarkable.  IMPRESSION: No active disease.   Electronically Signed   By: Margaree Mackintosh M.D.   On: 08/08/2013 16:19     EKG Interpretation   Date/Time:  Monday August 08 2013 15:35:56  EDT Ventricular Rate:  130 PR Interval:    QRS Duration: 69 QT Interval:  304 QTC Calculation: 447 R Axis:   -36 Text Interpretation:  Sinus tachycardia Left axis deviation Abnormal  R-wave progression, early transition Confirmed by Wyvonnia Dusky  MD, Syrai Gladwin  [phone]) on 08/08/2013 4:10:57 PM      MDM   Final diagnoses:  Idiopathic hypotension  Thoracic back pain, unspecified back pain laterality   3 day history of shortness of breath, back and chest pain. Tachycardia to 1:30 with blood pressure in the 80s. Concern for pulmonary embolism. Difficult body habitus for ultrasound. Unable to evaluate for right heart strain.  Creatinine is 1.8. Unable to give IV contrast safely. On recheck blood pressure has improved to 562Z 308M systolic.  CXR is negative.  Lungs are clear.  Patient afebrile but has elevated lactate and leukocytosis.  With SOB, will treat as possible pneumonia.  Antibiotics given and cultures obtained. Code sepsis called.  With hypotension and tachycardia, CP, SOB< hx of cancer, PE is considered.  Unable to get CTA due to renal insufficiency.  Bedside US with poor image quality.  BP dropping again to 80s.  Additional fluids given. Levophed ordered.   BP remains in low 90s. D/w PCCM who will evaluate. Also d/w Dr. Elsworth Soho diagnostic dilemma of unable to give IV contrast.  He is concerned about PE as well as dissection though this seems less likely as patient is hypotensive and pulses are equal.  He advises to hold heparin gtt at this time. D/w Dr. Radford Pax for possible TEE to evaluate for dissection. She states unable to perform at night. She would like PE ruled out first.  D/w Dr. Elsworth Soho.  Will obtain VQ and noncontrast CT chest. ICU admit.  Mentation and airway stable thoughout ED course.  Angiocath insertion Performed by: Ezequiel Essex  Consent: Verbal consent obtained. Risks and benefits: risks, benefits and alternatives were discussed Time out: Immediately prior to  procedure a "time out" was called to verify the correct patient, procedure, equipment, support staff and site/side marked as required.  Preparation: Patient was prepped and draped in the usual sterile fashion.  Vein Location: R basilic  Yes Ultrasound Guided Gauge: 20  Normal blood return and flush without difficulty Patient tolerance: Patient tolerated the procedure well with no immediate complications.   CRITICAL CARE Performed by: Ezequiel Essex Total critical care time: 45 Critical care time was exclusive of separately billable procedures and treating other patients. Critical care was necessary to treat or prevent imminent or life-threatening deterioration. Critical care was time spent personally by me on the following activities: development of treatment plan with patient and/or surrogate as well as nursing, discussions with consultants, evaluation of patient's response to treatment, examination of patient, obtaining history from patient or surrogate, ordering and performing  treatments and interventions, ordering and review of laboratory studies, ordering and review of radiographic studies, pulse oximetry and re-evaluation of patient's condition.       Ezequiel Essex, MD 08/09/13 0230

## 2013-08-08 NOTE — Progress Notes (Signed)
[facility] Progress Note Patient Name: [name] DOB: 03-Feb-1956 MRN: [id number]  Date of Service  08/08/2013   HPI/Events of Note   Nuclear med perfusion scan normal, CT chest neg  eICU Interventions  Plan d/c heparin drip order   Intervention Category Intermediate Interventions: Diagnostic test evaluation  Asencion Noble 08/08/2013, 9:57 PM

## 2013-08-08 NOTE — ED Notes (Signed)
Rancour, MD notified re: vitals, MD at bedside.

## 2013-08-08 NOTE — ED Notes (Signed)
Pt in via [facility], pt from PCP office, pt c/o mid CP that radiates into back, pt c/o SOB, hx of [place], pt rcvd x 1 neb tx in route, pt A&O x4, follows commands, speaks in complete sentences, pt rcvd 81 mg ASA pta, pt denies n/v/d

## 2013-08-08 NOTE — Procedures (Signed)
Central Venous Catheter Insertion Procedure Note [name] [id number] 14-Jul-1955  Procedure: Insertion of Central Venous Catheter Indications: Assessment of intravascular volume and Drug and/or fluid administration  Procedure Details Consent: Risks of procedure as well as the alternatives and risks of each were explained to the (patient/caregiver).  Consent for procedure obtained. Time Out: Verified patient identification, verified procedure, site/side was marked, verified correct patient position, special equipment/implants available, medications/allergies/relevent history reviewed, required imaging and test results available.  Performed  Maximum sterile technique was used including antiseptics, cap, gloves, gown, hand hygiene, mask and sheet. Skin prep: Chlorhexidine; local anesthetic administered A antimicrobial bonded/coated triple lumen catheter was placed in the right internal jugular vein using the Seldinger technique.  Evaluation Blood flow good Complications: No apparent complications Patient did tolerate procedure well. Chest X-ray ordered to verify placement.  CXR: normal.RIJ in position  ALVA,RAKESH V. 08/08/2013, 7:44 PM

## 2013-08-08 NOTE — ED Notes (Signed)
Rancour, MD notified re: hypotension, pulses radial & DP remain strong, pt A&O x4, pt placed in supine position

## 2013-08-08 NOTE — ED Notes (Addendum)
To VQ via [place] with patient

## 2013-08-08 NOTE — Progress Notes (Addendum)
ANTIBIOTIC CONSULT NOTE - INITIAL  Pharmacy Consult for vancomycin/zosyn Indication: septic shock  Allergies  Allergen Reactions  . Shellfish Allergy Shortness Of Breath  . Bee Venom Hives  . Morphine And Related Other (See Comments)    Overly sedated  . Oxycodone Hcl Hives    Patient Measurements: Height: 5' 6" (167.6 cm) Weight: 303 lb (137.44 kg) IBW/kg (Calculated) : 59.3  Vital Signs: Temp: 98.1 F (36.7 C) (06/22 1826) Temp src: Rectal (06/22 1826) BP: 82/40 mmHg (06/22 1848) Pulse Rate: 106 (06/22 1815) Intake/Output from previous day:   Intake/Output from this shift:    Labs:  Recent Labs  08/08/13 1600 08/08/13 1611  WBC 24.5*  --   HGB 11.3* 12.9  PLT 279  --   CREATININE 1.70* 1.80*   Estimated Creatinine Clearance: 49.3 ml/min (by C-G formula based on Cr of 1.8). No results found for this basename: VANCOTROUGH, VANCOPEAK, VANCORANDOM, GENTTROUGH, GENTPEAK, GENTRANDOM, TOBRATROUGH, TOBRAPEAK, TOBRARND, AMIKACINPEAK, AMIKACINTROU, AMIKACIN,  in the last 72 hours   Microbiology: No results found for this or any previous visit (from the past 720 hour(s)).  Medical History: Past Medical History  Diagnosis Date  . Depression   . Thyroid disease   . GERD (gastroesophageal reflux disease)   . Pancreatitis     hx of  . Knee pain, bilateral   . Obesity   . HSV (herpes simplex virus) infection   . Hyperlipemia   . Asthma   . Tachycardia     with sx in 2008  . Breast mass in female     L breast 2008, US showed likely fat necrosis  . Arthritis   . Anxiety   . Heartburn   . Hypertension     sees Dr. Criss Rosales , [facility] [place]  . Hx of radiation therapy 05/05/12- 07/15/12    right breast, 45 gray x 25 fx, lumpectomy cavity boosted to 16.2 gray  . Sleep apnea     uses cpap, pt does not know settings  . History of kidney stones   . Headache(784.0)   . 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  . Anemia    . History of blood transfusion 2011  . Hypothyroidism   . Elevated glucose     pt denies diabetes glucose elevated on cmet glucose  08-03-2012  . 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 Polyneuropathy in diabetes(357.2)   . Benign paroxysmal positional vertigo 12/30/2012    Assessment: 58 yo female to begin vancomycin and zosyn for septic shock. WBC= 24.5, afebrile, SCr= 1.8 (SCr was 1.7 04/2013 and 1.14 02/2013) and CrCl ~ 50. A dose ov vancomycin and zosyn has been given in the ED  6/22 vanc>> 6/22 zosyn>>  6/22 urine 6/22 blood x2  Goal of Therapy:  Vancomycin trough level 15-20 mcg/ml  Plan:  -Zosyn 3.375gm IV q8h -Vancomycin 15102m IV (to complete a total of 25044m x1 followed by 200024mV q24h -Will follow renal function, cultures and clinical progress  AndHildred Laserharm D 08/08/2013 7:53 PM

## 2013-08-08 NOTE — ED Notes (Signed)
Report called to the floor.  Arman Bogus RN will transport after the VQ and CT

## 2013-08-08 NOTE — ED Notes (Signed)
Elsworth Soho, MD at bedside inserting central line

## 2013-08-08 NOTE — ED Notes (Signed)
Reported low BP t RN ,  RN reported low BP to DR Rancour.   BP's done manual

## 2013-08-08 NOTE — ED Notes (Signed)
NOTIFIED DR. RANCOUR IN PERSON FOR PATIENTS PANIC LAB RESULTS OF CG4+ LACTIC ACID = 3.29mol/L _0 :20 PM ,08/08/2013.

## 2013-08-08 NOTE — H&P (Addendum)
PULMONARY / CRITICAL CARE MEDICINE   Name: [name] MRN: [id number] DOB: 06/27/1955     ADMISSION DATE:  08/08/2013 CONSULTATION DATE:  08/08/13  REFERRING MD :  Rancour, ED PRIMARY SERVICE: PCCM  CHIEF COMPLAINT:  Chest pain, low blood pressure  BRIEF PATIENT DESCRIPTION: 58 year old, diabetic hypertensive, CK D, presents with substernal chest pain, hypotension, leukocytosis and lactate of 3.7  SIGNIFICANT EVENTS / STUDIES:  CT chest no contrast 6/22 >> VQ 6/22 >>  LINES / TUBES: RIJ 6/22  CULTURES: bld 6/22 >> Urine 6/22 >>  ANTIBIOTICS: vanc 6/22 >> Zosyn 6/22 >>  HISTORY OF PRESENT ILLNESS:  58 year old, diabetic hypertensive, CK D, presents with substernal chest pain, hypotension, leukocytosis and lactate of 3.7. She was brought in by EMS from PCP office. She complained of substernal chest pain that was burning radiating to her back, intermittent last 3 days, worsened with exertion, no specific relieving factor. Of note lactate is 3.7, she received 3 L of iv fluids without improvement in blood pressure. She reports mild dyspnea, is no history of similar complaints in the past. She has a history of breast cancer and OSA Of note CT angiogram was negative in January 2015, when d-dimer was noted to be positive   PAST MEDICAL HISTORY :  Past Medical History  Diagnosis Date  . Depression   . Thyroid disease   . GERD (gastroesophageal reflux disease)   . Pancreatitis     hx of  . Knee pain, bilateral   . Obesity   . HSV (herpes simplex virus) infection   . Hyperlipemia   . Asthma   . Tachycardia     with sx in 2008  . Breast mass in female     L breast 2008, US showed likely fat necrosis  . Arthritis   . Anxiety   . Heartburn   . Hypertension     sees Dr. Criss Rosales , [facility] [place]  . Hx of radiation therapy 05/05/12- 07/15/12    right breast, 45 gray x 25 fx, lumpectomy cavity boosted to 16.2 gray  . Sleep apnea     uses cpap, pt does not know settings  .  History of kidney stones   . Headache(784.0)   . 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  . Anemia   . History of blood transfusion 2011  . Hypothyroidism   . Elevated glucose     pt denies diabetes glucose elevated on cmet glucose  08-03-2012  . 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 Polyneuropathy in diabetes(357.2)   . Benign paroxysmal positional vertigo 12/30/2012   Past Surgical History  Procedure Laterality Date  . Replacement total knee bilateral Bilateral yrs ago  . Foot surgery Right 10 yrs ago  . Abdominal surgery    . Thyroid surgery  yrs ago  . Joint replacement    . Abdominal hysterectomy  yrs ago    partial  . Breast surgery Right   . Breast lumpectomy  03/03/2012    Procedure: LUMPECTOMY;  Surgeon: Haywood Lasso, MD;  Location: [facility];  Service: General;  Laterality: Right;  . Cholecystectomy  yrs ago  . Colonoscopy with propofol N/A 09/28/2012    Procedure: COLONOSCOPY WITH PROPOFOL;  Surgeon: Lear Ng, MD;  Location: [facility];  Service: Endoscopy;  Laterality: N/A;   Prior to Admission medications   Medication Sig Start Date End Date Taking? Authorizing Caeleb Batalla  anastrozole (ARIMIDEX) 1 MG tablet  Take 1 mg by mouth at bedtime. 08/13/12  Yes Amy Milda Smart, PA-C  ARIPiprazole (ABILIFY) 10 MG tablet Take 10 mg by mouth daily.    Yes Historical Daylon Lafavor, MD  CALCIUM PO Take 1 tablet by mouth daily.    Yes Historical Camrin Lapre, MD  cholecalciferol (VITAMIN D) 1000 UNITS tablet Take 1 tablet (1,000 Units total) by mouth daily. 11/18/12  Yes Chauncey Cruel, MD  desonide (DESOWEN) 0.05 % ointment Apply 1 application topically 2 (two) times daily.   Yes Historical Sahara Fujimoto, MD  diclofenac sodium (VOLTAREN) 1 % GEL Apply 2 g topically 4 (four) times daily.   Yes Historical Mika Griffitts, MD  esomeprazole (NEXIUM) 40 MG capsule Take 40 mg by mouth daily before breakfast.   Yes Historical Lilie Vezina, MD  fluticasone  (CUTIVATE) 0.05 % cream Apply 1 application topically 2 (two) times daily.   Yes Historical Codie Hainer, MD  gabapentin (NEURONTIN) 100 MG capsule Take 100-300 mg by mouth 2 (two) times daily. takes 135m at breakfast, 103mat lunch and 30059mt bedtime 12/20/12  Yes Historical Cloa Bushong, MD  insulin aspart (NOVOLOG) 100 UNIT/ML injection Inject 2 Units into the skin 3 (three) times daily before meals. Sliding scale   Yes Historical Ariv Penrod, MD  Insulin Detemir (LEVEMIR FLEXPEN) 100 UNIT/ML Pen Inject 15 Units into the skin at bedtime.   Yes Historical Elexus Barman, MD  levothyroxine (SYNTHROID, LEVOTHROID) 200 MCG tablet Take 200 mcg by mouth daily before breakfast.    Yes Historical Shital Crayton, MD  levothyroxine (SYNTHROID, LEVOTHROID) 25 MCG tablet Take 25 mcg by mouth daily before breakfast.   Yes Historical Anais Koenen, MD  lisinopril (PRINIVIL,ZESTRIL) 2.5 MG tablet Take 2.5 mg by mouth daily. 12/20/12  Yes Historical Archer Moist, MD  meloxicam (MOBIC) 15 MG tablet Take 15 mg by mouth daily.    Yes Historical Sharetta Ricchio, MD  Multiple Vitamin (MULTIVITAMIN WITH MINERALS) TABS Take 1 tablet by mouth daily.   Yes Historical Khalon Cansler, MD  PARoxetine (PAXIL) 20 MG tablet Take 20 mg by mouth daily.    Yes Historical Kona Lover, MD  rosuvastatin (CRESTOR) 20 MG tablet Take 20 mg by mouth daily.   Yes Historical Brees Hounshell, MD  sitaGLIPtin-metformin (JANUMET) 50-500 MG per tablet Take 1 tablet by mouth 2 (two) times daily with a meal.   Yes Historical Chinelo Benn, MD  traMADol (ULTRAM) 50 MG tablet Take 50 mg by mouth every 6 (six) hours as needed for moderate pain.  05/30/13  Yes Historical Nahjae Hoeg, MD  traZODone (DESYREL) 100 MG tablet Take 100 mg by mouth at bedtime.   Yes Historical Braydn Carneiro, MD  valACYclovir (VALTREX) 1000 MG tablet Take 1,000 mg by mouth daily.   Yes Historical Adreonna Yontz, MD  valsartan-hydrochlorothiazide (DIOVAN-HCT) 160-12.5 MG per tablet Take 1 tablet by mouth daily.  02/08/13  Yes Historical Dashonda Bonneau,  MD  zolpidem (AMBIEN) 10 MG tablet Take 10 mg by mouth at bedtime as needed for sleep.    Yes Historical Tamilyn Lupien, MD   Allergies  Allergen Reactions  . Shellfish Allergy Shortness Of Breath  . Bee Venom Hives  . Morphine And Related Other (See Comments)    Overly sedated  . Oxycodone Hcl Hives    FAMILY HISTORY:  Family History  Problem Relation Age of Onset  . Heart disease Brother   . Diabetes Brother   . Hypertension Mother   . Diabetes Mother   . Breast cancer Mother 60 52 Bone cancer Mother   . Hypertension Sister   . Diabetes Sister   .  Breast cancer Sister 75  . Breast cancer Maternal Grandmother   . Heart disease Maternal Grandmother   . Uterine cancer Other 19  . Breast cancer Paternal Aunt 64  . Breast cancer Paternal Grandmother     dx in her 76s  . Prostate cancer Paternal Grandfather    SOCIAL HISTORY:  reports that she quit smoking about 21 years ago. Her smoking use included Cigarettes. She has a 7 pack-year smoking history. She has never used smokeless tobacco. She reports that she does not drink alcohol or use illicit drugs.  REVIEW OF SYSTEMS:  Constitutional: negative for anorexia, fevers and sweats  Eyes: negative for irritation, redness and visual disturbance  Ears, nose, mouth, throat, and face: negative for earaches, epistaxis, nasal congestion and sore throat  Respiratory: negative for cough,  sputum and wheezing  Cardiovascular: negative for lower extremity edema, orthopnea, palpitations and syncope  Gastrointestinal: negative for abdominal pain, constipation, diarrhea, melena, nausea and vomiting  Genitourinary:negative for dysuria, frequency and hematuria  Hematologic/lymphatic: negative for bleeding, easy bruising and lymphadenopathy  Musculoskeletal:negative for arthralgias, muscle weakness and stiff joints  Neurological: negative for coordination problems, gait problems, headaches and weakness  Endocrine: negative for diabetic symptoms  including polydipsia, polyuria and weight loss   SUBJECTIVE:   VITAL SIGNS: Temp:  [98 F (36.7 C)-98.1 F (36.7 C)] 98.1 F (36.7 C) (06/22 1826) Pulse Rate:  [105-130] 106 (06/22 1815) Resp:  [9-22] 9 (06/22 1815) BP: (68-120)/(31-91) 82/40 mmHg (06/22 1848) SpO2:  [95 %-100 %] 100 % (06/22 1815) Weight:  [137.44 kg (303 lb)] 137.44 kg (303 lb) (06/22 1530) HEMODYNAMICS:   VENTILATOR SETTINGS:   INTAKE / OUTPUT: Intake/Output     06/22 0701 - 06/23 0700   IV Piggyback 2250   Total Intake(mL/kg) 2250 (16.4)   Net +2250         PHYSICAL EXAMINATION: Gen. Pleasant, obese, acutely ill, anxious affect ENT - no lesions, no post nasal drip Neck: No JVD, no thyromegaly, no carotid bruits Lungs: no use of accessory muscles, no dullness to percussion, clear without rales or rhonchi  Cardiovascular: Rhythm regular, heart sounds  normal, no murmurs, no peripheral edema Abdomen: soft and non-tender, no hepatosplenomegaly, BS normal. Musculoskeletal: No deformities, no cyanosis or clubbing, good peripheral pulses Neuro:  alert, non focal Skin:  Warm, no lesions/ rash, good cap Refill   LABS:  CBC  Recent Labs Lab 08/08/13 1600 08/08/13 1611  WBC 24.5*  --   HGB 11.3* 12.9  HCT 34.9* 38.0  PLT 279  --    Coag's No results found for this basename: APTT, INR,  in the last 168 hours BMET  Recent Labs Lab 08/08/13 1600 08/08/13 1611  NA 137 136*  K 4.5 4.2  CL 95* 99  CO2 22  --   BUN 41* 39*  CREATININE 1.70* 1.80*  GLUCOSE 147* 149*   Electrolytes  Recent Labs Lab 08/08/13 1600  CALCIUM 11.0*   Sepsis Markers  Recent Labs Lab 08/08/13 1615  LATICACIDVEN 3.67*   ABG  Recent Labs Lab 08/08/13 1701  PHART 7.438  PCO2ART 36.5  PO2ART 95.0   Liver Enzymes No results found for this basename: AST, ALT, ALKPHOS, BILITOT, ALBUMIN,  in the last 168 hours Cardiac Enzymes  Recent Labs Lab 08/08/13 1600  PROBNP 155.5*   Glucose No results  found for this basename: GLUCAP,  in the last 168 hours  Imaging Dg Chest Portable 1 View  08/08/2013   CLINICAL DATA:  SHORTNESS  OF BREATH CHEST PAIN  EXAM: PORTABLE CHEST - 1 VIEW  COMPARISON:  Two-view chest in 02/18/2013  FINDINGS: Low lung volumes. The heart size and mediastinal contours are within normal limits. Both lungs are clear. The visualized skeletal structures are unremarkable.  IMPRESSION: No active disease.   Electronically Signed   By: Margaree Mackintosh M.D.   On: 08/08/2013 16:19    EKG - sinus tachycardia CXR: No infiltrates, right IJ position  ASSESSMENT / PLAN: The cause of her chest pain is unclear at this time. Differential diagnosis includes pulmonary embolism, esophageal cause such as GERD or less likely aortic dissection given low blood pressure and good peripheral pulses. Clearly this needs to be ruled out before anticoagulating her empirically  PULMONARY A: Mild Hypoxia P:   Use nasal cannula as needed Check d-dimer Proceed with VQ scan Venous duplex Hold off empiric anticoagulation for above reason,   CARDIOVASCULAR A: Chest pain Shock of unclear etiology, treated septic shock for now P:  Serial troponin CT chest without contrast Septic shock protocol- repeat lactate, CVP and coox Levophed gtt  RENAL A:  CK D. P:   Hydration Hold ARB and lisinopril  GASTROINTESTINAL A:  No issues P:   npo  HEMATOLOGIC A:  Anemia of chronic disease Leukocytosis P:  Follow  INFECTIOUS A:  No clear source, if sepsis P:   Check urinalysis Empiric vanc/Zosyn Check pro calcitonin  ENDOCRINE A:  Diabetes2   P:   SSI  NEUROLOGIC A:  No issues P:   Hold home meds  TODAY'S SUMMARY: Unclear cause of chest pain and hypotension, discussed with cardiology-no evidence of acute ischemic syndrome, if VQ scan is not confirmatory, will need definitive study to rule out dissection This plan and the diagnostic dilemma was discussed with the patient and  family  I have personally obtained a history, examined the patient, evaluated laboratory and imaging results, formulated the assessment and plan and placed orders. CRITICAL CARE: The patient is critically ill with multiple organ systems failure and requires high complexity decision making for assessment and support, frequent evaluation and titration of therapies, application of advanced monitoring technologies and extensive interpretation of multiple databases. Critical Care Time devoted to patient care services described in this note is 60  minutes.    Kara Mead MD. [place] Flood. [facility] Pager [phone] If no response call [phone]    08/08/2013, 7:45 PM

## 2013-08-08 NOTE — ED Notes (Signed)
Rancour, MD at bedside.

## 2013-08-08 NOTE — Progress Notes (Signed)
ANTICOAGULATION CONSULT NOTE - Initial Consult  Pharmacy Consult for heparin Indication: pulmonary embolus  Allergies  Allergen Reactions  . Shellfish Allergy Shortness Of Breath  . Bee Venom Hives  . Morphine And Related Other (See Comments)    Overly sedated  . Oxycodone Hcl Hives    Patient Measurements: Height: 5' 6" (167.6 cm) Weight: 303 lb (137.44 kg) IBW/kg (Calculated) : 59.3 Heparin Dosing Weight: 93kg  Vital Signs: Temp: 98.1 F (36.7 C) (06/22 1826) Temp src: Rectal (06/22 1826) BP: 86/31 mmHg (06/22 1815) Pulse Rate: 106 (06/22 1815)  Labs:  Recent Labs  08/08/13 1600 08/08/13 1611  HGB 11.3* 12.9  HCT 34.9* 38.0  PLT 279  --   CREATININE 1.70* 1.80*    Estimated Creatinine Clearance: 49.3 ml/min (by C-G formula based on Cr of 1.8).   Medical History: Past Medical History  Diagnosis Date  . Depression   . Thyroid disease   . GERD (gastroesophageal reflux disease)   . Pancreatitis     hx of  . Knee pain, bilateral   . Obesity   . HSV (herpes simplex virus) infection   . Hyperlipemia   . Asthma   . Tachycardia     with sx in 2008  . Breast mass in female     L breast 2008, US showed likely fat necrosis  . Arthritis   . Anxiety   . Heartburn   . Hypertension     sees Dr. Criss Rosales , [facility] [place]  . Hx of radiation therapy 05/05/12- 07/15/12    right breast, 45 gray x 25 fx, lumpectomy cavity boosted to 16.2 gray  . Sleep apnea     uses cpap, pt does not know settings  . History of kidney stones   . Headache(784.0)   . 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  . Anemia   . History of blood transfusion 2011  . Hypothyroidism   . Elevated glucose     pt denies diabetes glucose elevated on cmet glucose  08-03-2012  . 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 Polyneuropathy in diabetes(357.2)   . Benign paroxysmal positional vertigo 12/30/2012     Assessment: 58 yo female here with SOB/CP  and concern for PE to start heparin.  Goal of Therapy:  Heparin level 0.3-0.7 units/ml Monitor platelets by anticoagulation protocol: Yes   Plan:  -Heparin bolus 5000 units IV followed by 1500 units/hr (~16 units/kg/hr) -Heparin level in 6 hours and daily wth CBC daily  Hildred Laser, Pharm D 08/08/2013 6:38 PM

## 2013-08-09 ENCOUNTER — Encounter (HOSPITAL_COMMUNITY): Payer: Self-pay | Admitting: Physician Assistant

## 2013-08-09 DIAGNOSIS — R578 Other shock: Secondary | ICD-10-CM

## 2013-08-09 DIAGNOSIS — A419 Sepsis, unspecified organism: Secondary | ICD-10-CM

## 2013-08-09 DIAGNOSIS — R079 Chest pain, unspecified: Secondary | ICD-10-CM

## 2013-08-09 DIAGNOSIS — I959 Hypotension, unspecified: Secondary | ICD-10-CM

## 2013-08-09 DIAGNOSIS — E669 Obesity, unspecified: Secondary | ICD-10-CM

## 2013-08-09 LAB — URINE CULTURE
Colony Count: NO GROWTH
Culture: NO GROWTH

## 2013-08-09 LAB — GLUCOSE, CAPILLARY
GLUCOSE-CAPILLARY: 285 mg/dL — AB (ref 70–99)
GLUCOSE-CAPILLARY: 258 mg/dL — AB (ref 70–99)
GLUCOSE-CAPILLARY: 135 mg/dL — AB (ref 70–99)
GLUCOSE-CAPILLARY: 193 mg/dL — AB (ref 70–99)
Glucose-Capillary: 166 mg/dL — ABNORMAL HIGH (ref 70–99)
Glucose-Capillary: 137 mg/dL — ABNORMAL HIGH (ref 70–99)

## 2013-08-09 LAB — CBC
HEMATOCRIT: 29.1 % — AB (ref 36.0–46.0)
Hemoglobin: 9.3 g/dL — ABNORMAL LOW (ref 12.0–15.0)
MCH: 26.8 pg (ref 26.0–34.0)
MCHC: 32 g/dL (ref 30.0–36.0)
MCV: 83.9 fL (ref 78.0–100.0)
Platelets: 216 10*3/uL (ref 150–400)
RBC: 3.47 MIL/uL — ABNORMAL LOW (ref 3.87–5.11)
RDW: 15.8 % — ABNORMAL HIGH (ref 11.5–15.5)
WBC: 17.2 10*3/uL — ABNORMAL HIGH (ref 4.0–10.5)

## 2013-08-09 LAB — BASIC METABOLIC PANEL
BUN: 34 mg/dL — ABNORMAL HIGH (ref 6–23)
CO2: 25 mEq/L (ref 19–32)
CREATININE: 1.25 mg/dL — AB (ref 0.50–1.10)
Calcium: 9.7 mg/dL (ref 8.4–10.5)
Chloride: 100 mEq/L (ref 96–112)
GFR calc non Af Amer: 47 mL/min — ABNORMAL LOW (ref >90)
GFR, EST AFRICAN AMERICAN: 54 mL/min — AB (ref >90)
Glucose, Bld: 258 mg/dL — ABNORMAL HIGH (ref 70–99)
POTASSIUM: 5.3 meq/L (ref 3.7–5.3)
Sodium: 140 mEq/L (ref 137–147)

## 2013-08-09 LAB — TROPONIN I

## 2013-08-09 LAB — PROCALCITONIN: Procalcitonin: <0.1 ng/mL

## 2013-08-09 LAB — CORTISOL: Cortisol, Plasma: 20.8 ug/dL

## 2013-08-09 MED ORDER — LEVOTHYROXINE SODIUM 25 MCG PO TABS
225.0000 ug | ORAL_TABLET | Freq: Every day | ORAL | Status: DC
  Administered 2013-08-10 – 2013-08-12 (×3): 225 ug via ORAL
  Filled 2013-08-09 (×5): qty 1

## 2013-08-09 MED ORDER — INSULIN ASPART 100 UNIT/ML ~~LOC~~ SOLN
0.0000 [IU] | SUBCUTANEOUS | Status: DC
  Administered 2013-08-09: 2 [IU] via SUBCUTANEOUS
  Administered 2013-08-09: 8 [IU] via SUBCUTANEOUS
  Administered 2013-08-09: 3 [IU] via SUBCUTANEOUS
  Administered 2013-08-09: 8 [IU] via SUBCUTANEOUS

## 2013-08-09 MED ORDER — SODIUM CHLORIDE 0.9 % IJ SOLN
10.0000 mL | INTRAMUSCULAR | Status: DC | PRN
  Administered 2013-08-11: 10 mL

## 2013-08-09 MED ORDER — ACETAMINOPHEN 500 MG PO TABS
1000.0000 mg | ORAL_TABLET | Freq: Once | ORAL | Status: AC
  Administered 2013-08-09: 1000 mg via ORAL
  Filled 2013-08-09: qty 2

## 2013-08-09 MED ORDER — LEVOTHYROXINE SODIUM 200 MCG PO TABS
200.0000 ug | ORAL_TABLET | Freq: Every day | ORAL | Status: DC
  Filled 2013-08-09: qty 1

## 2013-08-09 MED ORDER — INSULIN ASPART 100 UNIT/ML ~~LOC~~ SOLN
0.0000 [IU] | Freq: Three times a day (TID) | SUBCUTANEOUS | Status: DC
  Administered 2013-08-09: 2 [IU] via SUBCUTANEOUS
  Administered 2013-08-09: 3 [IU] via SUBCUTANEOUS
  Administered 2013-08-10: 2 [IU] via SUBCUTANEOUS
  Administered 2013-08-10 – 2013-08-11 (×3): 3 [IU] via SUBCUTANEOUS
  Administered 2013-08-11: 2 [IU] via SUBCUTANEOUS
  Administered 2013-08-11: 3 [IU] via SUBCUTANEOUS
  Administered 2013-08-12: 2 [IU] via SUBCUTANEOUS
  Administered 2013-08-12: 3 [IU] via SUBCUTANEOUS
  Administered 2013-08-12: 2 [IU] via SUBCUTANEOUS

## 2013-08-09 MED ORDER — PANTOPRAZOLE SODIUM 40 MG PO TBEC
40.0000 mg | DELAYED_RELEASE_TABLET | Freq: Two times a day (BID) | ORAL | Status: DC
  Administered 2013-08-09 – 2013-08-12 (×7): 40 mg via ORAL
  Filled 2013-08-09 (×7): qty 1

## 2013-08-09 MED ORDER — LEVOTHYROXINE SODIUM 25 MCG PO TABS
25.0000 ug | ORAL_TABLET | Freq: Every day | ORAL | Status: DC
Start: 2013-08-10 — End: 2013-08-09

## 2013-08-09 NOTE — Care Management Note (Addendum)
    Page 1 of 1   08/12/2013     3:10:38 PM CARE MANAGEMENT NOTE 08/12/2013  Patient:  DIMITRIA, KETCHUM A   Account Number:  [id number]  Date Initiated:  08/09/2013  Documentation initiated by:  Elissa Hefty  Subjective/Objective Assessment:   adm w shock     Action/Plan:   lives alone, pcp dr Bailey Mech [facility]   Anticipated DC Date:  08/11/2013   Anticipated DC Plan:  [place]  CM consult      [facility] Choice  HOME HEALTH   Choice offered to / List presented to:  C-1 Patient        [place] arranged  [place] [facility]      [place].   Status of service:  Completed, signed off Medicare Important Message given?  YES (If response is "NO", the following Medicare IM given date fields will be blank) Date Medicare IM given:  08/12/2013 Date Additional Medicare IM given:    Discharge Disposition:  [place]  Per UR Regulation:  Reviewed for med. necessity/level of care/duration of stay  If discussed at [place] of Stay Meetings, dates discussed:    Comments:  08-13-1506 Jacqlyn Krauss, RN,BSN [phone] CM did speak to pt in reference to [facility] services and pt has chosen [facility] for services. CM did make referral and SOC to begin within 24-48 hours post d/c. No further needs from CM at this time.

## 2013-08-09 NOTE — Progress Notes (Signed)
Pt setup on CPAP per MD order. Nasal mask per Pt request. Auto titrate. Water added to humidity chamber. Pt comfortable. RT will monitor.

## 2013-08-09 NOTE — Progress Notes (Signed)
eLink Physician-Brief Progress Note Patient Name: [name] DOB: 08-27-1955 MRN: [id number]  Date of Service  08/09/2013   HPI/Events of Note   tylenold for chronic pback pain  eICU Interventions     Intervention Category Minor Interventions: Routine modifications to care plan (e.g. PRN medications for pain, fever)  RAMASWAMY,MURALI 08/09/2013, 10:03 PM

## 2013-08-09 NOTE — Progress Notes (Signed)
08/09/2013 Inpatient Diabetes Program Recommendations  AACE/ADA: New Consensus Statement on Inpatient Glycemic Control (2013)  Target Ranges:  Prepandial:   less than 140 mg/dL      Peak postprandial:   less than 180 mg/dL (1-2 hours)      Critically ill patients:  140 - 180 mg/dL   Results for FEY, COGHILL (MRN [id number]) as of 08/09/2013 11:46  Ref. Range 11/20/2012 17:10 08/08/2013 22:23 08/09/2013 02:41 08/09/2013 04:08 08/09/2013 08:16  Glucose-Capillary Latest Range: 70-99 mg/dL 219 (H) 274 (H) 285 (H) 258 (H) 166 (H)   If blood sugars remain elevated please consider adding 10 units of Levemir insulin.   Gentry Fitz, RN, BA, MHA, CDE Diabetes Coordinator Inpatient Diabetes Program  [phone] (Team Pager) [phone] [facility]) 08/09/2013 11:48 AM

## 2013-08-09 NOTE — Progress Notes (Signed)
Echocardiogram 2D Echocardiogram has been performed.  Joelene Millin 08/09/2013, 3:59 PM

## 2013-08-09 NOTE — Consult Note (Signed)
CARDIOLOGY CONSULT NOTE   Patient ID: [name] MRN: [id number] DOB/AGE: 1955-03-11 58 y.o.  Admit date: 08/08/2013  Primary Physician   [name], MD Primary Cardiologist  New Reason for Consultation   Chest pain  TXM:[name] is a 58 y.o. female with no history of CAD.  She has a history of right breast CA with biopsy (ductal carcinoma in situ, low-grade), treated with anastrozole. She also has a history of DM, HLD, HTN, anxiety and depression. She was admitted 06/22 with chest pain, hypotension requiring Levophed. She was hypoxic but did not require intubation. Her lactate level was elevated, but the source is unclear. She has responded to treatment for sepsis, and is improved. Cardiology was asked to evaluate her chest pain.  [name] had onset of chest pain over one week ago. It would wax and wane but was continuous since then until after she got to the hospital. The range was between a 3/10 and an 8/10. Exertion made it worse. She chronically takes Nexium for reflux symptoms and that did not seem to help. There was no association with meals. She describes the pain as a pressure and tightness that made it difficult for her to breathe. She did not try any medications for the pain. It did not change with position changes. It was worse with deep inspiration or cough.  She had an episode approximately 2 weeks ago for which she called EMS but her symptoms resolved without intervention and she was not transported to the hospital.  She was also having shortness of breath, worse with exertion. She was coughing. She is not aware of any fevers. The cough was nonproductive. She was also having problems with orthostatic dizziness. 5 days ago, she had an episode of syncope where she stood up to go to the bathroom, walk a step or 2 and woke up on the floor. She was very dizzy after that but had no palpitations at any time. She was not having any unusual GI symptoms or any  dysuria.   Yesterday, she had an appointment with her primary care physician and was in a discuss her chest pain. However, she remembers being told that her oxygen level was very low and she needed to be in the hospital. Since being hospitalized, she feels much better and her chest pain resolved about 12 hours ago. Her exertion level is not very great but she has no history of exertional chest pain. Currently she is resting comfortably.   Past Medical History  Diagnosis Date  . Depression   . Thyroid disease   . GERD (gastroesophageal reflux disease)   . Pancreatitis     hx of  . Knee pain, bilateral   . Obesity   . HSV (herpes simplex virus) infection   . Hyperlipemia   . Asthma   . Tachycardia     with sx in 2008  . Breast mass in female     L breast 2008, US showed likely fat necrosis  . Arthritis   . Anxiety   . Heartburn   . Hypertension     sees Dr. Criss Rosales , [facility]  . Hx of radiation therapy 05/05/12- 07/15/12    right breast, 45 gray x 25 fx, lumpectomy cavity boosted to 16.2 gray  . Sleep apnea     uses cpap, pt does not know settings  . History of kidney stones   . Headache(784.0)   . Breast cancer 02/13/12  ruq  100'clock bx Ductal Carcinoma in Situ,(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  . Anemia   . History of blood transfusion 2011  . Hypothyroidism   . Diabetes mellitus   . 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 Polyneuropathy in diabetes(357.2)   . Benign paroxysmal positional vertigo 12/30/2012     Past Surgical History  Procedure Laterality Date  . Replacement total knee bilateral Bilateral yrs ago  . Foot surgery Right 10 yrs ago  . Abdominal surgery    . Thyroid surgery  yrs ago  . Joint replacement    . Abdominal hysterectomy  yrs ago    partial  . Breast surgery Right   . Breast lumpectomy  03/03/2012    Procedure: LUMPECTOMY;  Surgeon: Haywood Lasso, MD;  Location: [facility];  Service: General;  Laterality: Right;  . Cholecystectomy  yrs ago  .  Colonoscopy with propofol N/A 09/28/2012    Procedure: COLONOSCOPY WITH PROPOFOL;  Surgeon: Lear Ng, MD;  Location: [facility];  Service: Endoscopy;  Laterality: N/A;    Allergies  Allergen Reactions  . Shellfish Allergy Shortness Of Breath  . Bee Venom Hives  . Morphine And Related Other (See Comments)    Overly sedated  . Oxycodone Hcl Hives    I have reviewed the patient's current medications . heparin subcutaneous  5,000 Units Subcutaneous 3 times per day  . insulin aspart  0-15 Units Subcutaneous TID WC & HS  . [START ON 08/10/2013] levothyroxine  225 mcg Oral QAC breakfast  . pantoprazole  40 mg Oral BID  . piperacillin-tazobactam (ZOSYN)  IV  3.375 g Intravenous Q8H  . vancomycin  2,000 mg Intravenous Q24H   . sodium chloride 50 mL/hr at 08/08/13 2130   sodium chloride  Medication Sig  anastrozole (ARIMIDEX) 1 MG tablet Take 1 mg by mouth at bedtime.  ARIPiprazole (ABILIFY) 10 MG tablet Take 10 mg by mouth daily.   CALCIUM PO Take 1 tablet by mouth daily.   cholecalciferol (VITAMIN D) 1000 UNITS tablet Take 1 tablet (1,000 Units total) by mouth daily.  desonide (DESOWEN) 0.05 % ointment Apply 1 application topically 2 (two) times daily.  diclofenac sodium (VOLTAREN) 1 % GEL Apply 2 g topically 4 (four) times daily.  esomeprazole (NEXIUM) 40 MG capsule Take 40 mg by mouth daily before breakfast.  fluticasone (CUTIVATE) 0.05 % cream Apply 1 application topically 2 (two) times daily.  gabapentin (NEURONTIN) 100 MG capsule Take 100-300 mg by mouth 2 (two) times daily. takes 12m at breakfast, 1035mat lunch and 30066mt bedtime  insulin aspart (NOVOLOG) 100 UNIT/ML injection Inject 2 Units into the skin 3 (three) times daily before meals. Sliding scale  Insulin Detemir (LEVEMIR FLEXPEN) 100 UNIT/ML Pen Inject 15 Units into the skin at bedtime.  levothyroxine (SYNTHROID, LEVOTHROID) 200 MCG tablet Take 200 mcg by mouth daily before breakfast.   levothyroxine  (SYNTHROID, LEVOTHROID) 25 MCG tablet Take 25 mcg by mouth daily before breakfast.  lisinopril (PRINIVIL,ZESTRIL) 2.5 MG tablet Take 2.5 mg by mouth daily.  meloxicam (MOBIC) 15 MG tablet Take 15 mg by mouth daily.   Multiple Vitamin (MULTIVITAMIN WITH MINERALS) TABS Take 1 tablet by mouth daily.  PARoxetine (PAXIL) 20 MG tablet Take 20 mg by mouth daily.   rosuvastatin (CRESTOR) 20 MG tablet Take 20 mg by mouth daily.  sitaGLIPtin-metformin (JANUMET) 50-500 MG per tablet Take 1 tablet by mouth 2 (two) times daily with a meal.  traMADol (ULTRAM) 50 MG tablet Take 50 mg by mouth  every 6 (six) hours as needed for moderate pain.   traZODone (DESYREL) 100 MG tablet Take 100 mg by mouth at bedtime.  valACYclovir (VALTREX) 1000 MG tablet Take 1,000 mg by mouth daily.  valsartan-hydrochlorothiazide (DIOVAN-HCT) 160-12.5 MG per tablet Take 1 tablet by mouth daily.   zolpidem (AMBIEN) 10 MG tablet Take 10 mg by mouth at bedtime as needed for sleep.   anastrozole (ARIMIDEX) 1 MG tablet TAKE ONE TABLET BY MOUTH ONCE DAILY     History   Social History  . Marital Status: Single    Spouse Name: N/A    Number of Children: N/A  . Years of Education: N/A   Occupational History  . Disabled    Social History Main Topics  . Smoking status: Former Smoker -- 1.00 packs/day for 7 years    Types: Cigarettes    Quit date: 03/24/1992  . Smokeless tobacco: Never Used  . Alcohol Use: No  . Drug Use: No     Comment: quit 1995  . Sexual Activity: Yes    Birth Control/ Protection: Surgical   Other Topics Concern  . Not on file   Social History Narrative   Single. Disabled Biochemist, clinical.     Family Status  Relation Status Death Age  . Brother Alive   . Mother Deceased 76  . Sister Alive   . Maternal Grandmother Deceased 101  . Other Alive   . Father Deceased 73  . Sister Deceased 97    Multiple Scerosis  . Sister Alive   . Brother Deceased 36    murdered  . Brother Alive   . Maternal Aunt  Deceased   . Maternal Uncle Deceased   . Paternal Aunt Deceased   . Paternal Uncle Other   . Maternal Grandfather Deceased 95  . Paternal Grandmother Deceased   . Paternal Grandfather Deceased    Family History  Problem Relation Age of Onset  . Heart disease Brother     Multiple MIs, starting in his 51s  . Diabetes Brother   . Hypertension Mother   . Diabetes Mother   . Breast cancer Mother 3  . Bone cancer Mother   . Hypertension Sister   . Diabetes Sister   . Breast cancer Sister 33  . Breast cancer Maternal Grandmother   . Heart disease Maternal Grandmother   . Uterine cancer Other 19  . Breast cancer Paternal Aunt 6  . Breast cancer Paternal Grandmother     dx in her 61s  . Prostate cancer Paternal Grandfather      ROS:  Full 14 point review of systems complete and found to be negative unless listed above.  Physical Exam: Blood pressure 122/63, pulse 80, temperature 98.6 F (37 C), temperature source Oral, resp. rate 8, height _0  (1.651 m), weight 309 lb 11.9 oz (140.5 kg), SpO2 95.00%.  General: Well developed, well nourished, female in no acute distress Head: Eyes PERRLA, No xanthomas.   Normocephalic and atraumatic, oropharynx without edema or exudate. Dentition: Good Lungs: Essentially clear bilaterally Heart: HRRR S1 S2, no rub/gallop, no murmur. pulses are 2+ all 4 extrem.   Neck: No carotid bruits. No lymphadenopathy.  JVD not elevated. Abdomen: Bowel sounds present, abdomen soft and non-tender without masses or hernias noted. Msk:  No spine or cva tenderness. No weakness, no joint deformities or effusions. Extremities: No clubbing or cyanosis. No edema.  Neuro: Alert and oriented X 3. No focal deficits noted. Psych: Flat affect at times, responds appropriately  Skin: No rashes or lesions noted.  Labs:  Lab Results  Component Value Date   WBC 17.2* 08/09/2013   HGB 9.3* 08/09/2013   HCT 29.1* 08/09/2013   MCV 83.9 08/09/2013   PLT 216 08/09/2013      Recent Labs Lab 08/09/13 0400  NA 140  K 5.3  CL 100  CO2 25  BUN 34*  CREATININE 1.25*  CALCIUM 9.7  GLUCOSE 258*    Recent Labs  08/08/13 1840 08/09/13 0143  TROPONINI <0.30 <0.30    Recent Labs  08/08/13 1609  TROPIPOC 0.01   Pro B Natriuretic peptide (BNP)  Date/Time Value Ref Range Status  08/08/2013  4:00 PM 155.5* 0 - 125 pg/mL Final  07/19/2007  4:06 PM <30.0 pg/mL  0.0-100.0 pg/mL Final   Lab Results  Component Value Date   CHOL 147 03/23/2009   HDL 59 03/23/2009   LDLCALC 58 03/23/2009   TRIG 149 03/23/2009   Lab Results  Component Value Date   DDIMER 0.76* 08/08/2013    Echo: 06/04/2011 Left ventricle: The cavity size was normal. Systolic function was normal. The estimated ejection fraction was in the range of 55% to 60%. Wall motion was normal; there were no regional wall motion abnormalities.   ECG: 08/08/2013 Sinus tach Vent. rate 130 BPM PR interval * ms QRS duration 69 ms QT/QTc 304/447 ms P-R-T axes -1 -36 66  Radiology:  Ct Chest Wo Contrast 08/08/2013   CLINICAL DATA:  Substernal chest pain.  Hypotension.  Leukocytosis.  EXAM: CT CHEST WITHOUT CONTRAST  TECHNIQUE: Multidetector CT imaging of the chest was performed following the standard protocol without IV contrast.  COMPARISON:  Multiple exams, including 02/19/2013  FINDINGS: No pathologic thoracic adenopathy. Dependent subsegmental atelectasis in both lungs. No acute thoracic spine findings. Coronary atherosclerotic calcification noted. A right central line terminates in the SVC.  IMPRESSION: 1. Dependent subsegmental atelectasis in both lungs. 2. Coronary atherosclerosis.   Electronically Signed   By: Sherryl Barters M.D.   On: 08/08/2013 21:26   Nm Pulmonary Perf And Vent 08/08/2013   CLINICAL DATA:  Shortness of breath for 1 week. Chest pain. History of asthma.  EXAM: NUCLEAR MEDICINE VENTILATION - PERFUSION LUNG SCAN  TECHNIQUE: Ventilation images were obtained in multiple projections  using inhaled aerosol technetium 99 M DTPA. Perfusion images were obtained in multiple projections after intravenous injection of Tc-11mMAA.  RADIOPHARMACEUTICALS:  40 mCi Tc-968mTPA aerosol and 6 mCi Tc-9938mA  COMPARISON:  Portable chest 08/08/2013  FINDINGS: Ventilation: Homogeneous but diffusely decreased of distribution of activity throughout the lungs. Residual activity demonstrated in the esophagus and stomach. No focal filling defects.  Perfusion: No wedge shaped peripheral perfusion defects to suggest acute pulmonary embolism.  IMPRESSION: Low probability of pulmonary embolus.   Electronically Signed   By: WilLucienne CapersD.   On: 08/08/2013 21:47   Dg Chest Portable 1 View 08/08/2013   CLINICAL DATA:  Central line placement.  Shortness of breath.  EXAM: PORTABLE CHEST - 1 VIEW  COMPARISON:  Earlier today at 1610 hr.  FINDINGS: 1930 hr. Right internal jugular line terminates at the mid SVC. No pneumothorax.  Surgical changes of the right side of the thoracic inlet. Normal heart size. The inferior lateral left hemi thorax is excluded. Given this factor, no pleural fluid. Low lung volumes with resultant pulmonary interstitial prominence.  IMPRESSION: Right internal jugular line terminating at the mid SVC. No pneumothorax or other acute disease.  Partial exclusion of the inferior  lateral left hemi thorax.   Electronically Signed   By: Abigail Miyamoto M.D.   On: 08/08/2013 19:44   Dg Chest Portable 1 View 08/08/2013   CLINICAL DATA:  SHORTNESS OF BREATH CHEST PAIN  EXAM: PORTABLE CHEST - 1 VIEW  COMPARISON:  Two-view chest in 02/18/2013  FINDINGS: Low lung volumes. The heart size and mediastinal contours are within normal limits. Both lungs are clear. The visualized skeletal structures are unremarkable.  IMPRESSION: No active disease.   Electronically Signed   By: Margaree Mackintosh M.D.   On: 08/08/2013 16:19    ASSESSMENT AND PLAN:   The patient was seen today by Dr. Acie Fredrickson the patient evaluated and the  data reviewed.     Chest pain - her symptoms are atypical in that she had prolonged chest pain for a week without enzyme elevation or ischemic ECG changes. However, she has multiple cardiac risk factors including strong family history of premature coronary artery disease in her brother. Her EF was previously normal. Will check an echocardiogram, can do a nuclear stress test prior to discharge as long as EF is preserved. With her renal insufficiency, would probably avoid cath unless stress test is high risk.  Spoke with nuc med, because of VQ scan, cannot get resting images till tomorrow PM, then can get stress images on 06/25.   Active Problems:   Shock. - per CCM, improved    Acute renal insufficiency - Cr 1.7 in March 2015, improved with hydration, per CCM    Diabetes mellitus - per CCM     Signed: Rosaria Ferries, PA-C 08/09/2013 1:30 PM Beeper [phone]  Co-Sign MD   Attending Note:   The patient was seen and examined.  Agree with assessment and plan as noted above.  Changes made to the above note as needed.  Hx of a bit difficult - she has a flat affect.  Pt was admitted with several days of CP - lasting for hours at a time.  Pain was pleuritic.  Troponin levels are negative. Her ECG shows sinus tachy but no St or T wave changes.   VQ was low prob for pulmonary embolus  I think her symptoms are related to pneumonia with associated pleuritis.   She has multiple risk factors for CAD. I think that she needs a 2 day Lexiscan myoview.  LV function in the past was normal  Echo to be done today. If her EF is abnormal , I would recommend cath   Ramond Dial., MD, [facility] 08/09/2013, 3:09 PM

## 2013-08-09 NOTE — Progress Notes (Signed)
VASCULAR LAB PRELIMINARY  PRELIMINARY  PRELIMINARY  PRELIMINARY  Bilateral lower extremity venous duplex  completed.    Preliminary report:  Bilateral:  No evidence of DVT, superficial thrombosis, or Baker's Cyst.    CESTONE, HELENE, RVT 08/09/2013, 12:39 PM

## 2013-08-09 NOTE — Progress Notes (Signed)
PULMONARY / CRITICAL CARE MEDICINE   Name: [name] MRN: [id number] DOB: 1955/07/08     ADMISSION DATE:  08/08/2013 CONSULTATION DATE:  08/08/13  REFERRING MD :  [place], ED PRIMARY SERVICE: PCCM  CHIEF COMPLAINT:  Chest pain, low blood pressure  BRIEF PATIENT DESCRIPTION: 58 year old, diabetic hypertensive, CKD, admitted 6/22 with substernal chest pain, hypotension, leukocytosis and lactate of 3.7.  Recent completion of pred taper for herniated disc.  Neg VQ, neg troponin, neg non-contrast CT Chest.   SIGNIFICANT EVENTS / STUDIES:  CT chest no contrast 6/22 >> dependent subsegmental atx, coronary atherosclerosis VQ 6/22 >> no ventilation or perfusion defects, low probability of PE  LINES / TUBES: RIJ 6/22 >>  CULTURES: BCx2 6/22 >> Urine 6/22 >>  ANTIBIOTICS: vanc 6/22 >>6/23 Zosyn 6/22 >>    SUBJECTIVE: Pt reports feeling better than yesterday. No acute events overnight.   VITAL SIGNS: Temp:  [98 F (36.7 C)-98.7 F (37.1 C)] 98.2 F (36.8 C) (06/23 0800) Pulse Rate:  [80-130] 80 (06/23 1000) Resp:  [9-22] 9 (06/23 1000) BP: (68-137)/(25-91) 111/66 mmHg (06/23 1000) SpO2:  [95 %-100 %] 98 % (06/23 1000) Weight:  [303 lb (137.44 kg)-309 lb 11.9 oz (140.5 kg)] 309 lb 11.9 oz (140.5 kg) (06/23 0443)  HEMODYNAMICS: CVP:  [6 mmHg-19 mmHg] 12 mmHg  VENTILATOR SETTINGS:   INTAKE / OUTPUT: Intake/Output     06/22 0701 - 06/23 0700 06/23 0701 - 06/24 0700   I.V. (mL/kg) 586 (4.2) 150 (1.1)   IV Piggyback 2300 50   Total Intake(mL/kg) 2886 (20.5) 200 (1.4)   Urine (mL/kg/hr) 2500    Total Output 2500     Net +386 +200          PHYSICAL EXAMINATION: Gen. Pleasant, obese, acutely ill, anxious affect ENT - no lesions, no post nasal drip Neck: No JVD, no thyromegaly, no carotid bruits Lungs: no use of accessory muscles, no dullness to percussion, clear without rales or rhonchi  Cardiovascular: Rhythm regular, heart sounds  normal, no murmurs, no peripheral  edema Abdomen: soft and non-tender, no hepatosplenomegaly, BS normal. Musculoskeletal: No deformities, no cyanosis or clubbing, good peripheral pulses Neuro:  alert, non focal Skin:  Warm, no lesions/ rash, good cap Refill   LABS:  CBC  Recent Labs Lab 08/08/13 1600 08/08/13 1611 08/08/13 1950 08/09/13 0400  WBC 24.5*  --   --  17.2*  HGB 11.3* 12.9 10.9* 9.3*  HCT 34.9* 38.0 32.0* 29.1*  PLT 279  --   --  216   Coag's No results found for this basename: APTT, INR,  in the last 168 hours BMET  Recent Labs Lab 08/08/13 1600 08/08/13 1611 08/08/13 1950 08/09/13 0400  NA 137 136* 137 140  K 4.5 4.2 4.3 5.3  CL 95* 99 100 100  CO2 22  --   --  25  BUN 41* 39* 36* 34*  CREATININE 1.70* 1.80* 1.60* 1.25*  GLUCOSE 147* 149* 185* 258*   Electrolytes  Recent Labs Lab 08/08/13 1600 08/09/13 0400  CALCIUM 11.0* 9.7   Sepsis Markers  Recent Labs Lab 08/08/13 1615 08/08/13 1840 08/08/13 1951  LATICACIDVEN 3.67*  --  3.71*  PROCALCITON  --  <0.10  --    ABG  Recent Labs Lab 08/08/13 1701  PHART 7.438  PCO2ART 36.5  PO2ART 95.0   Liver Enzymes No results found for this basename: AST, ALT, ALKPHOS, BILITOT, ALBUMIN,  in the last 168 hours Cardiac Enzymes  Recent Labs Lab 08/08/13  1600 08/08/13 1840 08/09/13 0143  TROPONINI  --  <0.30 <0.30  PROBNP 155.5*  --   --    Glucose  Recent Labs Lab 08/08/13 2223 08/09/13 0241 08/09/13 0408 08/09/13 0816  GLUCAP 274* 285* 258* 166*    Imaging Ct Chest Wo Contrast  08/08/2013   CLINICAL DATA:  Substernal chest pain.  Hypotension.  Leukocytosis.  EXAM: CT CHEST WITHOUT CONTRAST  TECHNIQUE: Multidetector CT imaging of the chest was performed following the standard protocol without IV contrast.  COMPARISON:  Multiple exams, including 02/19/2013  FINDINGS: No pathologic thoracic adenopathy. Dependent subsegmental atelectasis in both lungs. No acute thoracic spine findings. Coronary atherosclerotic  calcification noted. A right central line terminates in the SVC.  IMPRESSION: 1. Dependent subsegmental atelectasis in both lungs. 2. Coronary atherosclerosis.   Electronically Signed   By: Sherryl Barters M.D.   On: 08/08/2013 21:26   Nm Pulmonary Perf And Vent  08/08/2013   CLINICAL DATA:  Shortness of breath for 1 week. Chest pain. History of asthma.  EXAM: NUCLEAR MEDICINE VENTILATION - PERFUSION LUNG SCAN  TECHNIQUE: Ventilation images were obtained in multiple projections using inhaled aerosol technetium 99 M DTPA. Perfusion images were obtained in multiple projections after intravenous injection of Tc-35mMAA.  RADIOPHARMACEUTICALS:  40 mCi Tc-988mTPA aerosol and 6 mCi Tc-9966mA  COMPARISON:  Portable chest 08/08/2013  FINDINGS: Ventilation: Homogeneous but diffusely decreased of distribution of activity throughout the lungs. Residual activity demonstrated in the esophagus and stomach. No focal filling defects.  Perfusion: No wedge shaped peripheral perfusion defects to suggest acute pulmonary embolism.  IMPRESSION: Low probability of pulmonary embolus.   Electronically Signed   By: WilLucienne CapersD.   On: 08/08/2013 21:47   Dg Chest Portable 1 View  08/08/2013   CLINICAL DATA:  Central line placement.  Shortness of breath.  EXAM: PORTABLE CHEST - 1 VIEW  COMPARISON:  Earlier today at 1610 hr.  FINDINGS: 1930 hr. Right internal jugular line terminates at the mid SVC. No pneumothorax.  Surgical changes of the right side of the thoracic inlet. Normal heart size. The inferior lateral left hemi thorax is excluded. Given this factor, no pleural fluid. Low lung volumes with resultant pulmonary interstitial prominence.  IMPRESSION: Right internal jugular line terminating at the mid SVC. No pneumothorax or other acute disease.  Partial exclusion of the inferior lateral left hemi thorax.   Electronically Signed   By: KylAbigail MiyamotoD.   On: 08/08/2013 19:44   Dg Chest Portable 1 View  08/08/2013    CLINICAL DATA:  SHORTNESS OF BREATH CHEST PAIN  EXAM: PORTABLE CHEST - 1 VIEW  COMPARISON:  Two-view chest in 02/18/2013  FINDINGS: Low lung volumes. The heart size and mediastinal contours are within normal limits. Both lungs are clear. The visualized skeletal structures are unremarkable.  IMPRESSION: No active disease.   Electronically Signed   By: HecMargaree MackintoshD.   On: 08/08/2013 16:19    ASSESSMENT / PLAN: The cause of her chest pain remains unclear. PE, MI, Dissection ruled out.  She also reports syncopal episode prior to admission.     PULMONARY A:  Mild Hypoxia - negative VQ scan for PE, CT Chest with dependent atx.  Positive D-Dimer 0.78 OSA - on CPAP P:   Oxygen for saturations > 93% Venous duplex pending  CPAP QHS ? If PAH is contributing factor with morbid obesity & OSA (+syncopal episode prior to admit)  CARDIOVASCULAR A:  Chest pain - troponin  negative Syncopal Episode - prior to admit, woke up on bathroom floor Shock of unclear etiology, treated septic shock for now P:  Heparin gtt d/c'd pm of 6/22 BP improved, not on pressors Hold home regimen: lisinopril, diovan  Cardiology consult for syncope evaluation   RENAL A:   CKD P:   Improved with hydration Continue to hold ARB & lisinopril   GASTROINTESTINAL A:   RUQ Pain - hx cholecystectomy Weight Loss - approx 40 lbs in last 6 months, no reduction of appetite.  No n/v.  P:   Restart carb modified diet  BID PPI  HEMATOLOGIC A:   Anemia of chronic disease Leukocytosis - recent completion of pred taper HX Breast CA P:  Follow CBC  INFECTIOUS A:   No clear source identified - neg urine, blood cultures (thus far) P:   Empiric Zosyn, consider d/c in am 6/24 D/C vanco PCT negative  ENDOCRINE A:   Diabetes 2   Hypothyroidism P:   SSI Hold home metformin (Janumet) Resume oral synthroid   NEUROLOGIC A:   Chronic Pain  P:   Hold home meds   Transfer to telemetry and to [facility] SVC as of am  6/24 0700.    Noe Gens, NP-C [facility] Pgr: [phone] or [phone]  08/09/2013, 10:45 AM  I have personally obtained a history, examined the patient, evaluated laboratory and imaging results, formulated the assessment and plan and placed orders.  Baltazar Apo, MD, PhD 08/09/2013, 2:45 PM [facility] [phone] or if no answer [phone]

## 2013-08-10 ENCOUNTER — Inpatient Hospital Stay (HOSPITAL_COMMUNITY): Payer: Medicare Other

## 2013-08-10 DIAGNOSIS — I10 Essential (primary) hypertension: Secondary | ICD-10-CM

## 2013-08-10 DIAGNOSIS — E039 Hypothyroidism, unspecified: Secondary | ICD-10-CM

## 2013-08-10 LAB — CBC
HEMATOCRIT: 29.3 % — AB (ref 36.0–46.0)
Hemoglobin: 9.5 g/dL — ABNORMAL LOW (ref 12.0–15.0)
MCH: 28 pg (ref 26.0–34.0)
MCHC: 32.4 g/dL (ref 30.0–36.0)
MCV: 86.4 fL (ref 78.0–100.0)
Platelets: 200 10*3/uL (ref 150–400)
RBC: 3.39 MIL/uL — ABNORMAL LOW (ref 3.87–5.11)
RDW: 16 % — AB (ref 11.5–15.5)
WBC: 11.9 10*3/uL — AB (ref 4.0–10.5)

## 2013-08-10 LAB — BASIC METABOLIC PANEL
BUN: 31 mg/dL — AB (ref 6–23)
CHLORIDE: 103 meq/L (ref 96–112)
CO2: 26 mEq/L (ref 19–32)
Calcium: 8.7 mg/dL (ref 8.4–10.5)
Creatinine, Ser: 1.14 mg/dL — ABNORMAL HIGH (ref 0.50–1.10)
GFR calc non Af Amer: 52 mL/min — ABNORMAL LOW (ref >90)
GFR, EST AFRICAN AMERICAN: 61 mL/min — AB (ref >90)
Glucose, Bld: 163 mg/dL — ABNORMAL HIGH (ref 70–99)
Potassium: 4.9 mEq/L (ref 3.7–5.3)
SODIUM: 141 meq/L (ref 137–147)

## 2013-08-10 LAB — GLUCOSE, CAPILLARY
GLUCOSE-CAPILLARY: 161 mg/dL — AB (ref 70–99)
GLUCOSE-CAPILLARY: 141 mg/dL — AB (ref 70–99)
GLUCOSE-CAPILLARY: 178 mg/dL — AB (ref 70–99)
Glucose-Capillary: 107 mg/dL — ABNORMAL HIGH (ref 70–99)

## 2013-08-10 MED ORDER — ACETAMINOPHEN 500 MG PO TABS
1000.0000 mg | ORAL_TABLET | Freq: Four times a day (QID) | ORAL | Status: DC | PRN
  Administered 2013-08-10 – 2013-08-12 (×7): 1000 mg via ORAL
  Filled 2013-08-10 (×8): qty 2

## 2013-08-10 MED ORDER — TECHNETIUM TC 99M SESTAMIBI GENERIC - CARDIOLITE
30.0000 | Freq: Once | INTRAVENOUS | Status: AC | PRN
  Administered 2013-08-10: 30 via INTRAVENOUS

## 2013-08-10 MED ORDER — NAPROXEN 250 MG PO TABS
250.0000 mg | ORAL_TABLET | Freq: Once | ORAL | Status: AC
  Administered 2013-08-10: 250 mg via ORAL
  Filled 2013-08-10: qty 1

## 2013-08-10 NOTE — Progress Notes (Signed)
Spoke with patient in regards to wearing CPAP. Patient wishes to go on CPAP at a later time. RT will follow up later.

## 2013-08-10 NOTE — Progress Notes (Signed)
Note: today is day 1 of 2-day nuclear stress test. Nuc med is doing resting images today, stress images tomorrow. Dayna Dunn PA-C

## 2013-08-10 NOTE — Progress Notes (Signed)
    Subjective:  Denies CP or dyspnea   Objective:  Filed Vitals:   08/09/13 1500 08/09/13 1653 08/09/13 2133 08/10/13 0520  BP: 127/76 131/62 115/59 104/58  Pulse: 79 81 88 77  Temp:  98 F (36.7 C) 98.2 F (36.8 C) 98.5 F (36.9 C)  TempSrc:  Oral Oral Oral  Resp: _0 Height:      Weight:      SpO2: 98% 96% 96% 97%    Intake/Output from previous day:  Intake/Output Summary (Last 24 hours) at 08/10/13 0738 Last data filed at 08/10/13 0435  Gross per 24 hour  Intake    550 ml  Output   1400 ml  Net   -850 ml    Physical Exam: Physical exam: Well-developed morbidly obese in no acute distress.  Skin is warm and dry.  HEENT is normal.  Neck is supple.  Chest is clear to auscultation with normal expansion.  Cardiovascular exam is regular rate and rhythm.  Abdominal exam nontender or distended. No masses palpated. Extremities show no edema. neuro grossly intact    Lab Results: Basic Metabolic Panel:  Recent Labs  08/09/13 0400 08/10/13 0530  NA 140 141  K 5.3 4.9  CL 100 103  CO2 25 26  GLUCOSE 258* 163*  BUN 34* 31*  CREATININE 1.25* 1.14*  CALCIUM 9.7 8.7   CBC:  Recent Labs  08/09/13 0400 08/10/13 0530  WBC 17.2* 11.9*  HGB 9.3* 9.5*  HCT 29.1* 29.3*  MCV 83.9 86.4  PLT 216 200   Cardiac Enzymes:  Recent Labs  08/08/13 1840 08/09/13 0143  TROPONINI <0.30 <0.30     Assessment/Plan:  1 chest pain-no further symptoms. Echocardiogram shows normal LV function. For nuclear study to risk stratify. 2 sepsis-management per primary care. 3 diabetes mellitus-follow CBG\ 4 acute renal failure-continue hydration.  Kirk Ruths 08/10/2013, 7:38 AM

## 2013-08-10 NOTE — Clinical Documentation Improvement (Signed)
Noted per 08/08/13 H&P.Marland KitchenMarland Kitchen"58 year old, diabetic hypertensive, CK D, presents with substernal chest pain, hypotension, leukocytosis and lactate of 3.7.".Marland KitchenMarland Kitchen Labs: 08/08/13:  08/09/13 07/31/13 BUN: 41*   34*   31* Creat 1.70*   1.25*  1.14*  GFR AA 37   54  61 TX: per 08/09/13 PN..."CKD  P: Improved with hydration -Continue to hold ARB & lisinopril"...  Daily Bmets, receiving continuous IV flds tapered to NS @ 50cc/hr.  For accurate Dx specificity & severity can noted "CKD" be further specified with stage. Thank you  Possible Clinical Conditions?  CKD Stage I - GFR > OR = 90 CKD Stage II - GFR 60-80 CKD Stage III - GFR 30-59 CKD Stage IV - GFR 15-29 CKD Stage V - GFR < 15 ESRD (End Stage Renal Disease) Other condition_____________ Cannot Clinically determine   Supporting Information: Risk Factors: See above note Signs & Symptoms: See above note Diagnostics: See above note Treatment: See above note  Thank You, Ermelinda Das, RN, BSN, CCDS Certified Clinical Documentation Specialist Pager: [facility]

## 2013-08-10 NOTE — Progress Notes (Signed)
PULMONARY / CRITICAL CARE MEDICINE   Name: [name] MRN: [id number] DOB: [birth year]     ADMISSION DATE:  08/08/2013 CONSULTATION DATE:  08/08/2013  REFERRING MD :  Rancour, ED PRIMARY SERVICE: PCCM  CHIEF COMPLAINT:  Hypotension  BRIEF PATIENT DESCRIPTION: 58 yo with DM, HTN, CKD, admitted 6/22 with substernal chest pain, hypotension, leukocytosis and lactate of 3.7.  Recently on prednisone for herniated disk.  Negative VQ scan, negative troponin, negative non-contrast chest CT.   SIGNIFICANT EVENTS / STUDIES:  6/22  CT chest >>> dependent subsegmental atelectasis, coronary atherosclerosis 6/22  VQ scan >>> no ventilation or perfusion defects, low probability of PE 6/23  Venous Doppler >>> neg  LINES / TUBES: R IJ  CVL 6/22 >>>  CULTURES: 6/22 Blood >>> 6/22 Urine >>> neg  ANTIBIOTICS: Vancomycin 6/22 >>> 6/23 Zosyn 6/22 >>>  SUBJECTIVE: No acute interval events  VITAL SIGNS: Temp:  [98 F (36.7 C)-98.6 F (37 C)] 98.5 F (36.9 C) (06/24 0520) Pulse Rate:  [77-92] 77 (06/24 0520) Resp:  [12-20] 18 (06/24 0520) BP: (104-131)/(58-79) 104/58 mmHg (06/24 0520) SpO2:  [96 %-99 %] 97 % (06/24 0520)  HEMODYNAMICS:    VENTILATOR SETTINGS:   INTAKE / OUTPUT: Intake/Output     06/23 0701 - 06/24 0700 06/24 0701 - 06/25 0700   I.V. (mL/kg) 450 (3.2)    IV Piggyback 100    Total Intake(mL/kg) 550 (3.9)    Urine (mL/kg/hr) 1400 (0.4)    Total Output 1400     Net -850            PHYSICAL EXAMINATION: General:  Resting in no distress Neuro:  Awake, alert HEENT:  PERRL Cardiovascular:  RRR, no m/r/g Lungs:  Bilateral air entry, no w/r/r Abdomen:  Soft, nontender, bowel sounds diminished Musculoskeletal:  Moves all extremities, no edema Skin:  Intact  LABS:  CBC  Recent Labs Lab 08/08/13 1600  08/08/13 1950 08/09/13 0400 08/10/13 0530  WBC 24.5*  --   --  17.2* 11.9*  HGB 11.3*  < > 10.9* 9.3* 9.5*  HCT 34.9*  < > 32.0* 29.1* 29.3*  PLT 279  --    --  216 200  < > = values in this interval not displayed.  Coag's No results found for this basename: APTT, INR,  in the last 168 hours  BMET  Recent Labs Lab 08/08/13 1600  08/08/13 1950 08/09/13 0400 08/10/13 0530  NA 137  < > 137 140 141  K 4.5  < > 4.3 5.3 4.9  CL 95*  < > 100 100 103  CO2 22  --   --  25 26  BUN 41*  < > 36* 34* 31*  CREATININE 1.70*  < > 1.60* 1.25* 1.14*  GLUCOSE 147*  < > 185* 258* 163*  < > = values in this interval not displayed.  Electrolytes  Recent Labs Lab 08/08/13 1600 08/09/13 0400 08/10/13 0530  CALCIUM 11.0* 9.7 8.7   Sepsis Markers  Recent Labs Lab 08/08/13 1615 08/08/13 1840 08/08/13 1951  LATICACIDVEN 3.67*  --  3.71*  PROCALCITON  --  <0.10  --    ABG  Recent Labs Lab 08/08/13 1701  PHART 7.438  PCO2ART 36.5  PO2ART 95.0   Liver Enzymes No results found for this basename: AST, ALT, ALKPHOS, BILITOT, ALBUMIN,  in the last 168 hours Cardiac Enzymes  Recent Labs Lab 08/08/13 1600 08/08/13 1840 08/09/13 0143  TROPONINI  --  <0.30 <0.30  PROBNP 155.5*  --   --  Glucose  Recent Labs Lab 08/09/13 0816 08/09/13 1216 08/09/13 1642 08/09/13 2113 08/10/13 0726 08/10/13 1133  GLUCAP 166* 135* 137* 193* 161* 141*   IMAGING:   Ct Chest Wo Contrast  08/08/2013   CLINICAL DATA:  Substernal chest pain.  Hypotension.  Leukocytosis.  EXAM: CT CHEST WITHOUT CONTRAST  TECHNIQUE: Multidetector CT imaging of the chest was performed following the standard protocol without IV contrast.  COMPARISON:  Multiple exams, including 02/19/2013  FINDINGS: No pathologic thoracic adenopathy. Dependent subsegmental atelectasis in both lungs. No acute thoracic spine findings. Coronary atherosclerotic calcification noted. A right central line terminates in the SVC.  IMPRESSION: 1. Dependent subsegmental atelectasis in both lungs. 2. Coronary atherosclerosis.   Electronically Signed   By: Sherryl Barters M.D.   On: 08/08/2013 21:26    Nm Pulmonary Perf And Vent  08/08/2013   CLINICAL DATA:  Shortness of breath for 1 week. Chest pain. History of asthma.  EXAM: NUCLEAR MEDICINE VENTILATION - PERFUSION LUNG SCAN  TECHNIQUE: Ventilation images were obtained in multiple projections using inhaled aerosol technetium 99 M DTPA. Perfusion images were obtained in multiple projections after intravenous injection of Tc-28mMAA.  RADIOPHARMACEUTICALS:  40 mCi Tc-951mTPA aerosol and 6 mCi Tc-9935mA  COMPARISON:  Portable chest 08/08/2013  FINDINGS: Ventilation: Homogeneous but diffusely decreased of distribution of activity throughout the lungs. Residual activity demonstrated in the esophagus and stomach. No focal filling defects.  Perfusion: No wedge shaped peripheral perfusion defects to suggest acute pulmonary embolism.  IMPRESSION: Low probability of pulmonary embolus.   Electronically Signed   By: WilLucienne CapersD.   On: 08/08/2013 21:47   Dg Chest Port 1 View  08/10/2013   CLINICAL DATA:  Assess for airspace disease  EXAM: PORTABLE CHEST - 1 VIEW  COMPARISON:  08/08/2013  FINDINGS: Right jugular central venous catheter tip in the SVC is unchanged. Surgical clips in the right thyroid region.  Prominent heart size. Mild right lower lobe atelectasis. Negative for edema or effusion.  IMPRESSION: Mild right lower lobe atelectasis.   Electronically Signed   By: ChaFranchot GalloD.   On: 08/10/2013 09:22   Dg Chest Portable 1 View  08/08/2013   CLINICAL DATA:  Central line placement.  Shortness of breath.  EXAM: PORTABLE CHEST - 1 VIEW  COMPARISON:  Earlier today at 1610 hr.  FINDINGS: 1930 hr. Right internal jugular line terminates at the mid SVC. No pneumothorax.  Surgical changes of the right side of the thoracic inlet. Normal heart size. The inferior lateral left hemi thorax is excluded. Given this factor, no pleural fluid. Low lung volumes with resultant pulmonary interstitial prominence.  IMPRESSION: Right internal jugular line  terminating at the mid SVC. No pneumothorax or other acute disease.  Partial exclusion of the inferior lateral left hemi thorax.   Electronically Signed   By: KylAbigail MiyamotoD.   On: 08/08/2013 19:44   Dg Chest Portable 1 View  08/08/2013   CLINICAL DATA:  SHORTNESS OF BREATH CHEST PAIN  EXAM: PORTABLE CHEST - 1 VIEW  COMPARISON:  Two-view chest in 02/18/2013  FINDINGS: Low lung volumes. The heart size and mediastinal contours are within normal limits. Both lungs are clear. The visualized skeletal structures are unremarkable.  IMPRESSION: No active disease.   Electronically Signed   By: HecMargaree MackintoshD.   On: 08/08/2013 16:19   ASSESSMENT / PLAN:  PULMONARY A:  Atelectasis with mild hypoxemia OSA on CPAP VTE ruled out P:   Goal SpO2>92  Supplemental oxygen CPAP qHS  CARDIOVASCULAR A:  Chest pain, likely atypical, VTE / dissection / ACS ruled out Syncopal episode in setting of hypotension H/o HTN P:  Cardiology following Goal MAP>65 Hold Lisinopril, Diovan  Nuclear stress test in progress  RENAL A:   Acute on chronic renal failure Hypovolemia  P:   Trend BMP NS_0   GASTROINTESTINAL A:   Nutrition GERD P:   Carb modified diet  Protonix  HEMATOLOGIC A:   Anemia of chronic disease H/o breast CA VTE Px P:  Trend CBC Heparin   INFECTIOUS A:   No clear infection source Improving leukocytosis  P:   Continue Zosyn  ENDOCRINE A:   DM 2 Hypothyroidism P:   SSI Hold Janumet Synthroid   NEUROLOGIC A:   Chronic pain syndrome P:   Monitor off pain Rx  TRH to assume care 6/24.  PCCM will sign off.  I have personally obtained history, examined patient, evaluated and interpreted laboratory and imaging results, reviewed medical records, formulated assessment / plan and placed orders.  Doree Fudge, MD [facility] [place] Pager: [phone]  08/10/2013, 12:44 PM

## 2013-08-10 NOTE — Progress Notes (Signed)
Placed patient on CPAP via nasal mask, auto titrate settings (min 7.0/max 15.0) cm H20.  Patient tolerating well at this time.  RN aware.

## 2013-08-11 ENCOUNTER — Encounter (HOSPITAL_COMMUNITY): Payer: Medicare Other

## 2013-08-11 DIAGNOSIS — K219 Gastro-esophageal reflux disease without esophagitis: Secondary | ICD-10-CM

## 2013-08-11 DIAGNOSIS — R079 Chest pain, unspecified: Secondary | ICD-10-CM

## 2013-08-11 LAB — CBC
HEMATOCRIT: 29.9 % — AB (ref 36.0–46.0)
Hemoglobin: 9.5 g/dL — ABNORMAL LOW (ref 12.0–15.0)
MCH: 27 pg (ref 26.0–34.0)
MCHC: 31.8 g/dL (ref 30.0–36.0)
MCV: 84.9 fL (ref 78.0–100.0)
PLATELETS: 194 10*3/uL (ref 150–400)
RBC: 3.52 MIL/uL — ABNORMAL LOW (ref 3.87–5.11)
RDW: 15.9 % — AB (ref 11.5–15.5)
WBC: 11.5 10*3/uL — AB (ref 4.0–10.5)

## 2013-08-11 LAB — GLUCOSE, CAPILLARY
GLUCOSE-CAPILLARY: 136 mg/dL — AB (ref 70–99)
Glucose-Capillary: 150 mg/dL — ABNORMAL HIGH (ref 70–99)
Glucose-Capillary: 185 mg/dL — ABNORMAL HIGH (ref 70–99)
Glucose-Capillary: 152 mg/dL — ABNORMAL HIGH (ref 70–99)

## 2013-08-11 MED ORDER — REGADENOSON 0.4 MG/5ML IV SOLN
INTRAVENOUS | Status: AC
  Filled 2013-08-11: qty 5

## 2013-08-11 MED ORDER — REGADENOSON 0.4 MG/5ML IV SOLN
INTRAVENOUS | Status: AC
  Administered 2013-08-11: 0.4 mg
  Filled 2013-08-11: qty 5

## 2013-08-11 NOTE — Progress Notes (Signed)
Subjective: No CP or SOB  Objective: Vital signs in last 24 hours: Temp:  [98 F (36.7 C)-98.6 F (37 C)] 98.2 F (36.8 C) (06/25 0548) Pulse Rate:  [82-88] 82 (06/25 0548) Resp:  [18] 18 (06/25 0548) BP: (101-109)/(57-59) 101/59 mmHg (06/25 0548) SpO2:  [96 %-98 %] 96 % (06/25 0548) Weight:  [305 lb 8 oz (138.574 kg)] 305 lb 8 oz (138.574 kg) (06/25 0548) Last BM Date: 08/08/13  Intake/Output from previous day: 06/24 0701 - 06/25 0700 In: 240 [P.O.:240] Out: 450 [Urine:450] Intake/Output this shift: Total I/O In: -  Out: 450 [Urine:450]  Medications Current Facility-Administered Medications  Medication Dose Route Frequency Provider Last Rate Last Dose  . 0.9 %  sodium chloride infusion  250 mL Intravenous PRN Kara Mead V, MD      . 0.9 %  sodium chloride infusion   Intravenous Continuous Kara Mead V, MD 50 mL/hr at 08/08/13 2130    . acetaminophen (TYLENOL) tablet 1,000 mg  1,000 mg Oral Q6H PRN Rogelia Mire, NP   1,000 mg at 08/11/13 0240  . heparin injection 5,000 Units  5,000 Units Subcutaneous 3 times per day Elsie Stain, MD   5,000 Units at 08/10/13 2037  . insulin aspart (novoLOG) injection 0-15 Units  0-15 Units Subcutaneous TID WC & HS Collene Gobble, MD   3 Units at 08/10/13 2200  . levothyroxine (SYNTHROID, LEVOTHROID) tablet 225 mcg  225 mcg Oral QAC breakfast Rigoberto Noel, MD   225 mcg at 08/10/13 0805  . pantoprazole (PROTONIX) EC tablet 40 mg  40 mg Oral BID Donita Brooks, NP   40 mg at 08/10/13 2037  . piperacillin-tazobactam (ZOSYN) IVPB 3.375 g  3.375 g Intravenous Q8H Dareen Piano, RPH   3.375 g at 08/11/13 0000  . sodium chloride 0.9 % injection 10-40 mL  10-40 mL Intracatheter PRN Rigoberto Noel, MD        PE: General appearance: alert, cooperative and no distress Lungs: clear to auscultation bilaterally Heart: regular rate and rhythm, S1, S2 normal, no murmur, click, rub or gallop Extremities: No LEE Pulses: 2+ and  symmetric Skin: Warm and dry Neurologic: Grossly normal  Lab Results:   Recent Labs  08/09/13 0400 08/10/13 0530 08/11/13 0445  WBC 17.2* 11.9* 11.5*  HGB 9.3* 9.5* 9.5*  HCT 29.1* 29.3* 29.9*  PLT 216 200 194   BMET  Recent Labs  08/08/13 1600  08/08/13 1950 08/09/13 0400 08/10/13 0530  NA 137  < > 137 140 141  K 4.5  < > 4.3 5.3 4.9  CL 95*  < > 100 100 103  CO2 22  --   --  25 26  GLUCOSE 147*  < > 185* 258* 163*  BUN 41*  < > 36* 34* 31*  CREATININE 1.70*  < > 1.60* 1.25* 1.14*  CALCIUM 11.0*  --   --  9.7 8.7  < > = values in this interval not displayed.   Assessment/Plan  Active Problems: 1 chest pain-no further symptoms. Echocardiogram shows normal LV function.  Stress portion of nuc today.  BP and HR stable. 2 sepsis-management per primary care.  3 diabetes mellitus-follow CBG\  4 acute renal failure-continue hydration.         LOS: 3 days    HAGER, BRYAN PA-C 08/11/2013 6:20 AM  As above, patient seen and examined. She denies chest pain or dyspnea. As outlined previously echocardiogram shows normal LV function and  enzymes negative. Patient to complete today nuclear study today. If negative no further cardiac workup planned. Kirk Ruths

## 2013-08-11 NOTE — Progress Notes (Signed)
Lexiscan myoview completed without complications.  Nuc. Results to follow.

## 2013-08-11 NOTE — Progress Notes (Signed)
[facility] PROGRESS NOTE  [name] IHK:[id number] DOB: 02-Mar-1955 DOA: 08/08/2013 PCP: [name], MD  Assessment/Plan: Hypotension/probable sepsis>> unclear etiology -Patient was admitted to [facility] service and was placed on septic shock protocol including vasopressors/levophed  -Blood and urine cultures were obtained on admission and patient started on empiric antibiotics >> cultures to date with no growth -Continue empiric Zosyn for now and follow (Vancocin discontinued on 6/23 Leukocytosis -Improving on empiric antibiotics Anemia of chronic disease -Hemoglobin remained stable, no gross bleeding Diabetes mellitus type 2 -Continue monitoring Accu-Cheks and sliding scale coverage AKI -Resolved with hydration and holding ARB/ACEI Chest pain -Echo with normal LV, patient to complete stress test today per cards -Chest pain-free today, follow Hypothyroidism -Continue Synthroid Consult PT OT and follow  Code Status: Full Family Communication: None bedside Disposition Plan: Pending clinical course   Consultants:  Patient was on CCM service through 6/24  Cardiology  Procedures: SIGNIFICANT EVENTS / STUDIES:  6/22 CT chest >>> dependent subsegmental atelectasis, coronary atherosclerosis  6/22 VQ scan >>> no ventilation or perfusion defects, low probability of PE  6/23 Venous Doppler >>> neg  LINES / TUBES:  RIJ 6/22 CULTURES:  bld 6/22 >>  Urine 6/22 >>   ANTIBIOTICS:  vanc 6/22 >> 623 Zosyn 6/22 >>   HPI/Subjective: States she feels much better today, denies chest pain and no shortness of breath. She denies cough  Objective: Filed Vitals:   08/11/13 0932  BP: 117/65  Pulse: 91  Temp:   Resp:     Intake/Output Summary (Last 24 hours) at 08/11/13 1400 Last data filed at 08/10/13 2300  Gross per 24 hour  Intake      0 ml  Output    450 ml  Net   -450 ml   Filed Weights   08/08/13 2126 08/09/13 0443 08/11/13 0548  Weight: 140.5 kg (309  lb 11.9 oz) 140.5 kg (309 lb 11.9 oz) 138.574 kg (305 lb 8 oz)    Exam:  General: alert & oriented x 3 In NAD Cardiovascular: RRR, nl S1 s2 Respiratory:  Decreased breath sounds at bases, otherwise clear to auscultation. Abdomen: soft +BS NT/ND, no masses palpable Extremities: No cyanosis and no edema    Data Reviewed: Basic Metabolic Panel:  Recent Labs Lab 08/08/13 1600 08/08/13 1611 08/08/13 1950 08/09/13 0400 08/10/13 0530  NA 137 136* 137 140 141  K 4.5 4.2 4.3 5.3 4.9  CL 95* 99 100 100 103  CO2 22  --   --  25 26  GLUCOSE 147* 149* 185* 258* 163*  BUN 41* 39* 36* 34* 31*  CREATININE 1.70* 1.80* 1.60* 1.25* 1.14*  CALCIUM 11.0*  --   --  9.7 8.7   Liver Function Tests: No results found for this basename: AST, ALT, ALKPHOS, BILITOT, PROT, ALBUMIN,  in the last 168 hours No results found for this basename: LIPASE, AMYLASE,  in the last 168 hours No results found for this basename: AMMONIA,  in the last 168 hours CBC:  Recent Labs Lab 08/08/13 1600 08/08/13 1611 08/08/13 1950 08/09/13 0400 08/10/13 0530 08/11/13 0445  WBC 24.5*  --   --  17.2* 11.9* 11.5*  HGB 11.3* 12.9 10.9* 9.3* 9.5* 9.5*  HCT 34.9* 38.0 32.0* 29.1* 29.3* 29.9*  MCV 84.1  --   --  83.9 86.4 84.9  PLT 279  --   --  216 200 194   Cardiac Enzymes:  Recent Labs Lab 08/08/13 1840 08/09/13 0143  TROPONINI <0.30 <0.30  BNP (last 3 results)  Recent Labs  08/08/13 1600  PROBNP 155.5*   CBG:  Recent Labs Lab 08/10/13 1133 08/10/13 1653 08/10/13 2044 08/11/13 0745 08/11/13 1130  GLUCAP 141* 107* 178* 150* 185*    Recent Results (from the past 240 hour(s))  CULTURE, BLOOD (ROUTINE X 2)     Status: None   Collection Time    08/08/13  4:30 PM      Result Value Ref Range Status   Specimen Description BLOOD RIGHT HAND   Final   Special Requests BOTTLES DRAWN AEROBIC AND ANAEROBIC 5CC   Final   Culture  Setup Time     Final   Value: 08/08/2013 22:25     Performed at [facility]   Culture     Final   Value:        BLOOD CULTURE RECEIVED NO GROWTH TO DATE CULTURE WILL BE HELD FOR 5 DAYS BEFORE ISSUING A FINAL NEGATIVE REPORT     Performed at [facility]   Report Status PENDING   Incomplete  CULTURE, BLOOD (ROUTINE X 2)     Status: None   Collection Time    08/08/13  4:40 PM      Result Value Ref Range Status   Specimen Description BLOOD LEFT HAND   Final   Special Requests BOTTLES DRAWN AEROBIC AND ANAEROBIC 5CC   Final   Culture  Setup Time     Final   Value: 08/08/2013 22:26     Performed at [facility]   Culture     Final   Value:        BLOOD CULTURE RECEIVED NO GROWTH TO DATE CULTURE WILL BE HELD FOR 5 DAYS BEFORE ISSUING A FINAL NEGATIVE REPORT     Performed at [facility]   Report Status PENDING   Incomplete  MRSA PCR SCREENING     Status: None   Collection Time    08/08/13  9:10 PM      Result Value Ref Range Status   MRSA by PCR NEGATIVE  NEGATIVE Final   Comment:            The GeneXpert MRSA Assay (FDA     approved for NASAL specimens     only), is one component of a     comprehensive MRSA colonization     surveillance program. It is not     intended to diagnose MRSA     infection nor to guide or     monitor treatment for     MRSA infections.  URINE CULTURE     Status: None   Collection Time    08/08/13  9:30 PM      Result Value Ref Range Status   Specimen Description URINE, CLEAN CATCH   Final   Special Requests NONE   Final   Culture  Setup Time     Final   Value: 08/08/2013 20:00     Performed at [facility] Count     Final   Value: NO GROWTH     Performed at [facility]   Culture     Final   Value: NO GROWTH     Performed at [facility]   Report Status 08/09/2013 FINAL   Final     Studies: Dg Chest Port 1 View  08/10/2013   CLINICAL DATA:  Assess for airspace disease  EXAM: PORTABLE CHEST - 1 VIEW  COMPARISON:  08/08/2013  FINDINGS: Right jugular  central venous catheter tip in the SVC is unchanged. Surgical clips in the right thyroid region.  Prominent heart size. Mild right lower lobe atelectasis. Negative for edema or effusion.  IMPRESSION: Mild right lower lobe atelectasis.   Electronically Signed   By: Franchot Gallo M.D.   On: 08/10/2013 09:22    Scheduled Meds: . heparin subcutaneous  5,000 Units Subcutaneous 3 times per day  . insulin aspart  0-15 Units Subcutaneous TID WC & HS  . levothyroxine  225 mcg Oral QAC breakfast  . pantoprazole  40 mg Oral BID  . piperacillin-tazobactam (ZOSYN)  IV  3.375 g Intravenous Q8H   Continuous Infusions: . sodium chloride 50 mL/hr at 08/08/13 2130    Active Problems:   Shock    Time spent: [facility] Pager [phone]. If 7PM-7AM, please contact night-coverage at [email or web address], password [facility] 08/11/2013, 2:00 PM  LOS: 3 days

## 2013-08-11 NOTE — Progress Notes (Signed)
ANTIBIOTIC CONSULT NOTE - FOLLOW UP  Pharmacy Consult for vancomycin/zosyn Indication: septic shock  Allergies  Allergen Reactions  . Shellfish Allergy Shortness Of Breath  . Bee Venom Hives  . Morphine And Related Other (See Comments)    Overly sedated  . Oxycodone Hcl Hives    Patient Measurements: Height: _0  (165.1 cm) Weight: 305 lb 8 oz (138.574 kg) IBW/kg (Calculated) : 57  Vital Signs: Temp: 98.2 F (36.8 C) (06/25 0548) Temp src: Oral (06/25 0548) BP: 117/65 mmHg (06/25 0932) Pulse Rate: 91 (06/25 0932) Intake/Output from previous day: 06/24 0701 - 06/25 0700 In: 240 [P.O.:240] Out: 450 [Urine:450] Intake/Output from this shift:    Labs:  Recent Labs  08/08/13 1950 08/09/13 0400 08/10/13 0530 08/11/13 0445  WBC  --  17.2* 11.9* 11.5*  HGB 10.9* 9.3* 9.5* 9.5*  PLT  --  216 200 194  CREATININE 1.60* 1.25* 1.14*  --    Estimated Creatinine Clearance: 77 ml/min (by C-G formula based on Cr of 1.14). No results found for this basename: VANCOTROUGH, VANCOPEAK, VANCORANDOM, [place], Kenneth, GENTRANDOM, [place], TOBRAPEAK, TOBRARND, AMIKACINPEAK, AMIKACINTROU, AMIKACIN,  in the last 72 hours   Microbiology: Recent Results (from the past 720 hour(s))  CULTURE, BLOOD (ROUTINE X 2)     Status: None   Collection Time    08/08/13  4:30 PM      Result Value Ref Range Status   Specimen Description BLOOD RIGHT HAND   Final   Special Requests BOTTLES DRAWN AEROBIC AND ANAEROBIC 5CC   Final   Culture  Setup Time     Final   Value: 08/08/2013 22:25     Performed at [facility]   Culture     Final   Value:        BLOOD CULTURE RECEIVED NO GROWTH TO DATE CULTURE WILL BE HELD FOR 5 DAYS BEFORE ISSUING A FINAL NEGATIVE REPORT     Performed at [facility]   Report Status PENDING   Incomplete  CULTURE, BLOOD (ROUTINE X 2)     Status: None   Collection Time    08/08/13  4:40 PM      Result Value Ref Range Status   Specimen Description  BLOOD LEFT HAND   Final   Special Requests BOTTLES DRAWN AEROBIC AND ANAEROBIC 5CC   Final   Culture  Setup Time     Final   Value: 08/08/2013 22:26     Performed at [facility]   Culture     Final   Value:        BLOOD CULTURE RECEIVED NO GROWTH TO DATE CULTURE WILL BE HELD FOR 5 DAYS BEFORE ISSUING A FINAL NEGATIVE REPORT     Performed at [facility]   Report Status PENDING   Incomplete  MRSA PCR SCREENING     Status: None   Collection Time    08/08/13  9:10 PM      Result Value Ref Range Status   MRSA by PCR NEGATIVE  NEGATIVE Final   Comment:            The GeneXpert MRSA Assay (FDA     approved for NASAL specimens     only), is one component of a     comprehensive MRSA colonization     surveillance program. It is not     intended to diagnose MRSA     infection nor to guide or     monitor treatment for  MRSA infections.  URINE CULTURE     Status: None   Collection Time    08/08/13  9:30 PM      Result Value Ref Range Status   Specimen Description URINE, CLEAN CATCH   Final   Special Requests NONE   Final   Culture  Setup Time     Final   Value: 08/08/2013 20:00     Performed at [facility] Count     Final   Value: NO GROWTH     Performed at [facility]   Culture     Final   Value: NO GROWTH     Performed at [facility]   Report Status 08/09/2013 FINAL   Final    Medical History: Past Medical History  Diagnosis Date  . Depression   . Thyroid disease   . GERD (gastroesophageal reflux disease)   . Pancreatitis     hx of  . Knee pain, bilateral   . Obesity   . HSV (herpes simplex virus) infection   . Hyperlipemia   . Asthma   . Tachycardia     with sx in 2008  . Breast mass in female     L breast 2008, US showed likely fat necrosis  . Arthritis   . Anxiety   . Heartburn   . Hypertension     sees Dr. Criss Rosales , [facility] [place]  . Hx of radiation therapy 05/05/12- 07/15/12    right breast, 45 gray x 25  fx, lumpectomy cavity boosted to 16.2 gray  . Sleep apnea     uses cpap, pt does not know settings  . History of kidney stones   . Headache(784.0)   . 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  . Anemia   . History of blood transfusion 2011  . Hypothyroidism   . Diabetes mellitus   . 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 Polyneuropathy in diabetes(357.2)   . Benign paroxysmal positional vertigo 12/30/2012    Assessment: 58 yo female on D#4 zosyn for septic shock. WBC down to 11.5, afebrile, SCr= down to BL and CrCl ~ 77 mL/min.   6/22 vanc>> 6/23 6/22 zosyn>>  6/22 Urine - neg 6/22 Blood x2 - ngtd 6/22 MRSA screen neg   Goal of Therapy:  Resolution of infection   Plan:  -Zosyn 3.375gm IV q8h -Will follow renal function, cultures and clinical progress  Albertina Parr, PharmD.  Clinical Pharmacist Pager [phone]

## 2013-08-12 DIAGNOSIS — M546 Pain in thoracic spine: Secondary | ICD-10-CM

## 2013-08-12 DIAGNOSIS — R471 Dysarthria and anarthria: Secondary | ICD-10-CM

## 2013-08-12 DIAGNOSIS — J45909 Unspecified asthma, uncomplicated: Secondary | ICD-10-CM

## 2013-08-12 LAB — GLUCOSE, CAPILLARY
Glucose-Capillary: 156 mg/dL — ABNORMAL HIGH (ref 70–99)
Glucose-Capillary: 128 mg/dL — ABNORMAL HIGH (ref 70–99)
Glucose-Capillary: 125 mg/dL — ABNORMAL HIGH (ref 70–99)

## 2013-08-12 LAB — BASIC METABOLIC PANEL
BUN: 17 mg/dL (ref 6–23)
CALCIUM: 9 mg/dL (ref 8.4–10.5)
CO2: 24 mEq/L (ref 19–32)
CREATININE: 1.04 mg/dL (ref 0.50–1.10)
Chloride: 99 mEq/L (ref 96–112)
GFR calc Af Amer: 68 mL/min — ABNORMAL LOW (ref >90)
GFR calc non Af Amer: 58 mL/min — ABNORMAL LOW (ref >90)
Glucose, Bld: 151 mg/dL — ABNORMAL HIGH (ref 70–99)
Potassium: 4.3 mEq/L (ref 3.7–5.3)
Sodium: 136 mEq/L — ABNORMAL LOW (ref 137–147)

## 2013-08-12 LAB — CBC
HCT: 30 % — ABNORMAL LOW (ref 36.0–46.0)
Hemoglobin: 9.7 g/dL — ABNORMAL LOW (ref 12.0–15.0)
MCH: 27.3 pg (ref 26.0–34.0)
MCHC: 32.3 g/dL (ref 30.0–36.0)
MCV: 84.5 fL (ref 78.0–100.0)
Platelets: 193 10*3/uL (ref 150–400)
RBC: 3.55 MIL/uL — ABNORMAL LOW (ref 3.87–5.11)
RDW: 15.9 % — AB (ref 11.5–15.5)
WBC: 11.9 10*3/uL — ABNORMAL HIGH (ref 4.0–10.5)

## 2013-08-12 MED ORDER — ATORVASTATIN CALCIUM 80 MG PO TABS
80.0000 mg | ORAL_TABLET | Freq: Every day | ORAL | Status: DC
  Filled 2013-08-12: qty 1

## 2013-08-12 MED ORDER — ASPIRIN 81 MG PO CHEW
81.0000 mg | CHEWABLE_TABLET | Freq: Once | ORAL | Status: AC
Start: 2013-08-12 — End: 2013-08-12
  Administered 2013-08-12: 81 mg via ORAL
  Filled 2013-08-12: qty 1

## 2013-08-12 MED ORDER — ASPIRIN EC 81 MG PO TBEC: 81.0000 mg | DELAYED_RELEASE_TABLET | Freq: Every day | ORAL | Status: DC

## 2013-08-12 MED ORDER — AMOXICILLIN-POT CLAVULANATE 875-125 MG PO TABS: 1.0000 | ORAL_TABLET | Freq: Two times a day (BID) | ORAL | Status: DC

## 2013-08-12 MED ORDER — ACETAMINOPHEN 500 MG PO TABS: 1000.0000 mg | ORAL_TABLET | Freq: Four times a day (QID) | ORAL | Status: DC | PRN

## 2013-08-12 NOTE — Evaluation (Signed)
Physical Therapy Evaluation Patient Details Name: [name] MRN: [id number] DOB: Dec 11, 1955 Today's Date: 08/12/2013   History of Present Illness  58 year old, diabetic hypertensive, CK D, presents with substernal chest pain, hypotension, leukocytosis. CP with MI ruled out, sepsis of unknown origin. Chronic back pain  Clinical Impression  Pt with chronic back pain, limited mobility at baseline and report of greater than 10 falls in the last year. Pt educated for use of RW at all times with pt in agreement that it made her feel safer and decreased back pain. Pt educated for back precautions to assist with protecting her injured back and decreasing pain. Pt with above and below deficits who will benefit from acute therapy to maximize mobility, balance, function and independence to decrease fall risk.    Follow Up Recommendations Home health PT    Equipment Recommendations  None recommended by PT    Recommendations for Other Services       Precautions / Restrictions Precautions Precautions: Fall;Back Precaution Comments: educated for back precautions to decrease pain Restrictions Weight Bearing Restrictions: No      Mobility  Bed Mobility Overal bed mobility: Needs Assistance Bed Mobility: Rolling;Sidelying to Sit Rolling: Supervision Sidelying to sit: Supervision Supine to sit: Min guard Sit to supine: Min assist   General bed mobility comments: cues for sequence  and use of rail for back precatuions  Transfers Overall transfer level: Needs assistance   Transfers: Sit to/from Stand Sit to Stand: Supervision         General transfer comment: cues for hand placement  Ambulation/Gait Ambulation/Gait assistance: Supervision Ambulation Distance (Feet): 100 Feet Assistive device: Rolling walker (2 wheeled) Gait Pattern/deviations: Step-through pattern;Decreased stride length;Trunk flexed Gait velocity: 5f/36sec=.56 ft/sec Gait velocity interpretation: <1.8  ft/sec, indicative of risk for recurrent falls    Stairs            Wheelchair Mobility    Modified Rankin (Stroke Patients Only)       Balance Overall balance assessment: Needs assistance         Standing balance support: During functional activity Standing balance-Leahy Scale: Fair                               Pertinent Vitals/Pain 6/10 back pain, pt repositioned unable to tolerate upright sitting and preferred reclining despite back precaution education    Home Living Family/patient expects to be discharged to:: Private residence Living Arrangements: Alone Available Help at Discharge: Family;Available PRN/intermittently Type of Home: House Home Access: Stairs to enter   Entrance Stairs-Number of Steps: 2 Home Layout: One level Home Equipment: Walker - 2 wheels;Cane - single point      Prior Function Level of Independence: Independent         Comments: pt uses a scooter for grocery shopping, son drives her     Hand Dominance   Dominant Hand: Right    Extremity/Trunk Assessment   Upper Extremity Assessment: Defer to OT evaluation           Lower Extremity Assessment: Generalized weakness      Cervical / Trunk Assessment: Lordotic;Other exceptions  Communication   Communication: No difficulties  Cognition Arousal/Alertness: Awake/alert Behavior During Therapy: Flat affect Overall Cognitive Status: Within Functional Limits for tasks assessed                      General Comments      Exercises  Assessment/Plan    PT Assessment Patient needs continued PT services  PT Diagnosis Difficulty walking;Acute pain   PT Problem List Decreased strength;Decreased activity tolerance;Decreased balance;Pain;Decreased knowledge of use of DME  PT Treatment Interventions Gait training;Stair training;DME instruction;Functional mobility training;Therapeutic activities;Patient/family education   PT Goals (Current goals  can be found in the Care Plan section) Acute Rehab PT Goals Patient Stated Goal: be independent without pain PT Goal Formulation: With patient Time For Goal Achievement: 08/26/13 Potential to Achieve Goals: Fair    Frequency Min 3X/week   Barriers to discharge Decreased caregiver support      Co-evaluation               End of Session   Activity Tolerance: Patient tolerated treatment well Patient left: in chair;with call bell/phone within reach Nurse Communication: Mobility status         Time: 7471-8550 PT Time Calculation (min): 14 min   Charges:   PT Evaluation $Initial PT Evaluation Tier I: 1 Procedure PT Treatments $Therapeutic Activity: 8-22 mins   PT G Codes:          Melford Aase 08/12/2013, 10:39 AM Elwyn Reach, [place]

## 2013-08-12 NOTE — Progress Notes (Signed)
I have left a message on our office's scheduling voicemail requesting a follow-up appointment, and our office will call the patient with this appointment. Dayna Dunn PA-C

## 2013-08-12 NOTE — Evaluation (Signed)
Occupational Therapy Evaluation Patient Details Name: [name] MRN: [id number] DOB: [birth year] Today's Date: 08/12/2013    History of Present Illness   58 year old, diabetic hypertensive, CK D, presents with substernal chest pain, hypotension, leukocytosis. CP with MI ruled out, sepsis of unknown origin. Chronic back pain     Clinical Impression   Pt with chronic back pain, limited mobility at baseline and report of greater than 10 falls in the last year.  Pt educated for back precautions to assist with protecting her injured back and decreasing pain. Pt with above and below deficits who will benefit from acute therapy to maximize mobility, balance, function and independence to decrease fall risk with adls. Ot to educated on AE for LB dressing.     Follow Up Recommendations  No OT follow up    Equipment Recommendations  None recommended by OT    Recommendations for Other Services       Precautions / Restrictions Precautions Precautions: Fall;Back Precaution Comments: hx of x3 hernia disc- per pt report Restrictions Weight Bearing Restrictions: No      Mobility Bed Mobility Overal bed mobility: Needs Assistance Bed Mobility: Rolling;Sidelying to Sit;Supine to Sit;Sit to Supine Rolling: Min guard ( min v/c) Sidelying to sit: Min guard Supine to sit: Min guard Sit to supine: Min assist   General bed mobility comments: pt requires cues for hand placement. Pt attempting to reach with RT UE posteriorly twisting with sit <>Supine. pt able to return demo  with cueing for sequencing  Transfers Overall transfer level: Needs assistance   Transfers: Sit to/from Stand Sit to Stand: Min guard         General transfer comment: pt reaching for environmental supports but declined dme.     Balance Overall balance assessment: Needs assistance         Standing balance support: During functional activity Standing balance-Leahy Scale: Fair                               ADL Overall ADL's : Needs assistance/impaired Eating/Feeding: Modified independent;Bed level   Grooming: Supervision/safety;Standing       Lower Body Bathing: Moderate assistance           Toilet Transfer: Min guard;Regular Toilet;Grab bars   Toileting- Water quality scientist and Hygiene: Min guard;Sit to/from stand Toileting - Clothing Manipulation Details (indicate cue type and reason): pt is able to perform peri hygiene static standing with knee flexion (squat position)     Functional mobility during ADLs: Min guard General ADL Comments: Pt reports 8 out 10 back pain with mobility. Pt educated supine on bed mobility using log roll and back precautions for good back health. Pt required min v/c to complete bed mobility. pt ambulating to bathroom wtih decr gait velocity decr gait length and declined use of RW. pt reaching for environmental supports such as sink and iv pole.     Vision                     Perception     Praxis      Pertinent Vitals/Pain 8 out 10 pain premedicated     Hand Dominance Right   Extremity/Trunk Assessment Upper Extremity Assessment Upper Extremity Assessment: Overall WFL for tasks assessed   Lower Extremity Assessment Lower Extremity Assessment: Defer to PT evaluation   Cervical / Trunk Assessment Cervical / Trunk Assessment: Normal   Communication Communication Communication: No difficulties  Cognition Arousal/Alertness: Awake/alert Behavior During Therapy: WFL for tasks assessed/performed Overall Cognitive Status: Within Functional Limits for tasks assessed                     General Comments       Exercises       Shoulder Instructions      Home Living Family/patient expects to be discharged to:: Private residence Living Arrangements: Alone Available Help at Discharge: Family Type of Home: House Home Access: Stairs to enter Technical brewer of Steps: 2   Home Layout: One level      Bathroom Shower/Tub: Occupational psychologist: Standard     Home Equipment: None          Prior Functioning/Environment Level of Independence: Independent             OT Diagnosis: Acute pain   OT Problem List: Decreased strength;Decreased activity tolerance;Impaired balance (sitting and/or standing);Decreased safety awareness;Decreased knowledge of use of DME or AE;Decreased knowledge of precautions;Obesity;Pain   OT Treatment/Interventions: Self-care/ADL training;Therapeutic exercise;DME and/or AE instruction;Therapeutic activities;Patient/family education;Balance training    OT Goals(Current goals can be found in the care plan section) Acute Rehab OT Goals Patient Stated Goal: to be able to take care of myself OT Goal Formulation: With patient Time For Goal Achievement: 08/26/13 Potential to Achieve Goals: Good  OT Frequency: Min 2X/week   Barriers to D/C:            Co-evaluation              End of Session Nurse Communication: Mobility status;Precautions  Activity Tolerance: Patient limited by pain Patient left: in bed;with call bell/phone within reach   Time: 0909-0930 OT Time Calculation (min): 21 min Charges:  OT General Charges $OT Visit: 1 Procedure OT Evaluation $Initial OT Evaluation Tier I: 1 Procedure OT Treatments $Self Care/Home Management : 8-22 mins G-Codes:    Peri Maris 2013/08/25, 9:57 AM Pager: [phone]

## 2013-08-12 NOTE — Progress Notes (Signed)
Subjective: Slept well.  No CP or SOB.  Objective: Vital signs in last 24 hours: Temp:  [97.6 F (36.4 C)-98.4 F (36.9 C)] 97.6 F (36.4 C) (06/26 0602) Pulse Rate:  [76-98] 76 (06/26 0602) Resp:  [18-20] 20 (06/26 0602) BP: (103-131)/(53-67) 123/67 mmHg (06/26 0602) SpO2:  [95 %-98 %] 95 % (06/26 0602) Weight:  [301 lb 4.8 oz (136.669 kg)] 301 lb 4.8 oz (136.669 kg) (06/26 0602) Last BM Date: 08/10/13  Intake/Output from previous day: 06/25 0701 - 06/26 0700 In: 940 [P.O.:240; I.V.:600; IV Piggyback:100] Out: -  Intake/Output this shift:    Medications Current Facility-Administered Medications  Medication Dose Route Frequency Provider Last Rate Last Dose  . 0.9 %  sodium chloride infusion  250 mL Intravenous PRN Kara Mead V, MD      . 0.9 %  sodium chloride infusion   Intravenous Continuous Rigoberto Noel, MD 50 mL/hr at 08/11/13 1748    . acetaminophen (TYLENOL) tablet 1,000 mg  1,000 mg Oral Q6H PRN Rogelia Mire, NP   1,000 mg at 08/11/13 2148  . heparin injection 5,000 Units  5,000 Units Subcutaneous 3 times per day Elsie Stain, MD   5,000 Units at 08/12/13 [phone]  . insulin aspart (novoLOG) injection 0-15 Units  0-15 Units Subcutaneous TID WC & HS Collene Gobble, MD   2 Units at 08/11/13 2148  . levothyroxine (SYNTHROID, LEVOTHROID) tablet 225 mcg  225 mcg Oral QAC breakfast Rigoberto Noel, MD   225 mcg at 08/11/13 0754  . pantoprazole (PROTONIX) EC tablet 40 mg  40 mg Oral BID Donita Brooks, NP   40 mg at 08/11/13 2148  . piperacillin-tazobactam (ZOSYN) IVPB 3.375 g  3.375 g Intravenous Q8H Dareen Piano, RPH   3.375 g at 08/12/13 0007  . sodium chloride 0.9 % injection 10-40 mL  10-40 mL Intracatheter PRN Rigoberto Noel, MD   10 mL at 08/11/13 1741    PE: General appearance: alert, cooperative and no distress  Lungs: clear to auscultation bilaterally  Heart: regular rate and rhythm, S1, S2 normal, no murmur, click, rub or gallop  Extremities: No  LEE  Pulses: 2+ right radial and 1+ left radial   Skin: Warm and dry  Neurologic: Grossly normal      Lab Results:   Recent Labs  08/10/13 0530 08/11/13 0445 08/12/13 0530  WBC 11.9* 11.5* 11.9*  HGB 9.5* 9.5* 9.7*  HCT 29.3* 29.9* 30.0*  PLT 200 194 193   BMET  Recent Labs  08/10/13 0530 08/12/13 0530  NA 141 136*  K 4.9 4.3  CL 103 99  CO2 26 24  GLUCOSE 163* 151*  BUN 31* 17  CREATININE 1.14* 1.04  CALCIUM 8.7 9.0    Lexiscan myoview: Electrically negative for ischemia. Myoview scan with mild thinning in the mid/distal anterior/anterolateral wall May reflect mild ischemia, cannot exclude shifting soft tissue (breast).  LVEF on gating LVEF is calculated at greater than 70%.    Assessment/Plan  Active Problems:  1 chest pain-no further symptoms. Ruled out for MI.  Myoview scan with mild thinning in the mid/distal anterior/anterolateral wall May reflect mild ischemia, Echocardiogram shows normal LV function.   Cardiologist recommendations to follow.  BP and HR stable.  Labs stable.   2 sepsis-management per primary care.   3 diabetes mellitus-follow CBG  4 acute renal failure-continue hydration.    LOS: 4 days    HAGER, BRYAN PA-C 08/12/2013 7:43 AM As  above, patient seen and examined. She has had no recurrent chest pain. I have reviewed her nuclear study. There is a question of minimal anterior ischemia versus soft tissue attenuation. It is a low risk study. I would favor medical therapy. Would treat with aspirin and statin. Patient should followup with Dr. Acie Fredrickson following DC. Please call with questions. Kirk Ruths

## 2013-08-12 NOTE — Progress Notes (Signed)
Pt discharged home with son.  Reviewed discharge instructions and education, all questions answered.  Assessment unchanged from earlier.

## 2013-08-12 NOTE — Discharge Summary (Signed)
Physician Discharge Summary  [name] QPY:[id number] DOB: 11-28-1955 DOA: 08/08/2013  PCP: [name], MD  Admit date: 08/08/2013 Discharge date: 08/12/2013  Time spent: >87mnutes  Recommendations for Outpatient Follow-up:  Follow-up Information   Follow up with PDarden Amber, MD. (Office will call you for your followup appointment. Call office if you have not heard back in 4 days.)    Specialty:  Cardiology   Contact information:   [id number] [phone]      Follow up with SMaximino Greenland MD. (In 1week, csll for appt appt upon discharge)    Specialty:  Internal Medicine   Contact information:   [id number]       Discharge Diagnoses:  Active Problems:   Shock  leukocytosis Anemia of chronic disease 2 diabetes mellitus Acute kidney injury Chest pain Hypothyroidism  Discharge Condition: Improved/stable  Diet recommendation: Modified carbohydrate/heart healthy  Filed Weights   08/09/13 0443 08/11/13 0548 08/12/13 0602  Weight: 140.5 kg (309 lb 11.9 oz) 138.574 kg (305 lb 8 oz) 136.669 kg (301 lb 4.8 oz)    History of present illness:  58year old, diabetic hypertensive, renal insufficiency, presents with substernal chest pain, hypotension, leukocytosis and lactate of 3.7. She was brought in by EMS from PCP office. She complained of substernal chest pain that was burning radiating to her back, intermittent last 3 days, worsened with exertion, no specific relieving factor. Of note lactate is 3.7, she received 3 L of iv fluids without improvement in blood pressure. She reports mild dyspnea, is no history of similar complaints in the past.  She has a history of breast cancer and OSA  Of note CT angiogram was negative in January 2015, when d-dimer was noted to be positive   Hospital Course:  Hypotension/probable sepsis>> unclear etiology  -As discussed  above,[facility] placed on septic shock protocol including vasopressors/levophed  -Blood and urine cultures were obtained on admission and patient started on empiric antibiotics >> cultures to date with no growth. Chest x-ray was done and showed mild right lower lobe atelectasis. - Patient improved clinically on antibiotics and doing narrow to Zosyn alone, vasopressors were discontinued and patient was transferred to the medical floor. -She continued to improve clinically and has remained hemodynamically stable -Up cultures to date remain negative, source of her sepsis is unclear>> should be discharged on empiric Augmentin to complete the treatment course. Leukocytosis  -Improved on empiric antibiotics, as was cell count prior to discharge they 11.9. She has remained afebrile and hemodynamically stable  Anemia of chronic disease  -Hemoglobin remained stable, no gross bleeding  Diabetes mellitus type 2  -Her Accu-Cheks and monitored and she was covered with sliding scale insulin. She is to resume her outpatient regiment upon discharge. AKI  -Resolved with hydration and holding ARB/ACEI. She is to continue her outpatient medications upon discharge. Chest pain -Upon admission cardiac enzymes were cycled and came back negative an echocardiogram was done and showed and EF of 55-60% with no regional wall motion abnormalities  -Lexi scan Myoview was done and was reported to be electrical he negative for ischemia, Myoview scan showed mild thinning in the mid/distal anterior/anterolateral wall? Mild ischemia, cannot exclude shifting soft tissue. Dr. CStanford Breedfollowed up with patient after this stress test and stated that it is a low risk study and recommended medical therapy>> she is to continue aspirin and statin on  discharge and followup with Dr. Acie Fredrickson -Patient remained Chest pain-free in the hospital. Hypothyroidism  -Continue Synthroid  Deconditioning -PTOT saw  patient and recommended home health PT.  Procedures:  SIGNIFICANT EVENTS / STUDIES:  6/22 CT chest >>> dependent subsegmental atelectasis, coronary atherosclerosis  6/22 VQ scan >>> no ventilation or perfusion defects, low probability of PE  6/23 Venous Doppler >>> neg  6/23 2-D echo  Study Conclusions  - Left ventricle: The cavity size was normal. Systolic function was normal. The estimated ejection fraction was in the range of 55% to 60%. Wall motion was normal; there were no regional wall motion abnormalities. Impressions: - Prominent epicardial fat pad.  Lexi scan Myoview IMPRESSION:  Lexiscan myoview: Electrically negative for ischemia. Myoview scan  with mild thinning in the mid/distal anterior/anterolateral wall May  reflect mild ischemia, cannot exclude shifting soft tissue (breast).  LVEF on gating LVEF is calculated at greater than 70%.  LINES / TUBES:  RIJ 6/22  CULTURES:  bld 6/22 >>  Urine 6/22 >>  Consultations: Patient was on CCM service through 6/24  Cardiology   Discharge Exam: Filed Vitals:   08/12/13 1355  BP: 122/58  Pulse: 94  Temp: 98.5 F (36.9 C)  Resp: 18   Exam:  General: alert & oriented x 3 In NAD  Cardiovascular: RRR, nl S1 s2  Respiratory: Decreased breath sounds at bases, otherwise clear to auscultation.  Abdomen: soft +BS NT/ND, no masses palpable  Extremities: No cyanosis and no edema Wall   Discharge Instructions You were cared for by a hospitalist during your hospital stay. If you have any questions about your discharge medications or the care you received while you were in the hospital after you are discharged, you can call the unit and asked to speak with the hospitalist on call if the hospitalist that took care of you is not available. Once you are discharged, your primary care physician will handle any further medical issues. Please note that NO REFILLS for any discharge medications will be authorized once you are discharged,  as it is imperative that you return to your primary care physician (or establish a relationship with a primary care physician if you do not have one) for your aftercare needs so that they can reassess your need for medications and monitor your lab values.  Discharge Instructions   Diet Carb Modified    Complete by:  As directed      Increase activity slowly    Complete by:  As directed             Medication List    STOP taking these medications       meloxicam 15 MG tablet  Commonly known as:  MOBIC      TAKE these medications       acetaminophen 500 MG tablet  Commonly known as:  TYLENOL  Take 2 tablets (1,000 mg total) by mouth every 6 (six) hours as needed for moderate pain.     amoxicillin-clavulanate 875-125 MG per tablet  Commonly known as:  AUGMENTIN  Take 1 tablet by mouth 2 (two) times daily.     anastrozole 1 MG tablet  Commonly known as:  ARIMIDEX  TAKE ONE TABLET BY MOUTH ONCE DAILY     anastrozole 1 MG tablet  Commonly known as:  ARIMIDEX  Take 1 mg by mouth at bedtime.     ARIPiprazole 10 MG tablet  Commonly known as:  ABILIFY  Take 10 mg by mouth daily.  CALCIUM PO  Take 1 tablet by mouth daily.     cholecalciferol 1000 UNITS tablet  Commonly known as:  VITAMIN D  Take 1 tablet (1,000 Units total) by mouth daily.     desonide 0.05 % ointment  Commonly known as:  DESOWEN  Apply 1 application topically 2 (two) times daily.     diclofenac sodium 1 % Gel  Commonly known as:  VOLTAREN  Apply 2 g topically 4 (four) times daily.     esomeprazole 40 MG capsule  Commonly known as:  NEXIUM  Take 40 mg by mouth daily before breakfast.     fluticasone 0.05 % cream  Commonly known as:  CUTIVATE  Apply 1 application topically 2 (two) times daily.     gabapentin 100 MG capsule  Commonly known as:  NEURONTIN  Take 100-300 mg by mouth 2 (two) times daily. takes 133m at breakfast, 101mat lunch and 30054mt bedtime     insulin aspart 100 UNIT/ML  injection  Commonly known as:  novoLOG  Inject 2 Units into the skin 3 (three) times daily before meals. Sliding scale     LEVEMIR FLEXPEN 100 UNIT/ML Pen  Generic drug:  Insulin Detemir  Inject 15 Units into the skin at bedtime.     levothyroxine 25 MCG tablet  Commonly known as:  SYNTHROID, LEVOTHROID  Take 25 mcg by mouth daily before breakfast.     levothyroxine 200 MCG tablet  Commonly known as:  SYNTHROID, LEVOTHROID  Take 200 mcg by mouth daily before breakfast.     lisinopril 2.5 MG tablet  Commonly known as:  PRINIVIL,ZESTRIL  Take 2.5 mg by mouth daily.     multivitamin with minerals Tabs tablet  Take 1 tablet by mouth daily.     PARoxetine 20 MG tablet  Commonly known as:  PAXIL  Take 20 mg by mouth daily.     rosuvastatin 20 MG tablet  Commonly known as:  CRESTOR  Take 20 mg by mouth daily.     sitaGLIPtin-metformin 50-500 MG per tablet  Commonly known as:  JANUMET  Take 1 tablet by mouth 2 (two) times daily with a meal.     traMADol 50 MG tablet  Commonly known as:  ULTRAM  Take 50 mg by mouth every 6 (six) hours as needed for moderate pain.     traZODone 100 MG tablet  Commonly known as:  DESYREL  Take 100 mg by mouth at bedtime.     valACYclovir 1000 MG tablet  Commonly known as:  VALTREX  Take 1,000 mg by mouth daily.     valsartan-hydrochlorothiazide 160-12.5 MG per tablet  Commonly known as:  DIOVAN-HCT  Take 1 tablet by mouth daily.     zolpidem 10 MG tablet  Commonly known as:  AMBIEN  Take 10 mg by mouth at bedtime as needed for sleep.      Aspirin 81 mg by mouth daily  Allergies  Allergen Reactions  . Shellfish Allergy Shortness Of Breath  . Bee Venom Hives  . Morphine And Related Other (See Comments)    Overly sedated  . Oxycodone Hcl Hives       Follow-up Information   Follow up with PhiDarden AmberMD. (Office will call you for your followup appointment. Call office if you have not heard back in 4 days.)    Specialty:   Cardiology   Contact information:   [address] [phone]  Follow up with [name], MD. (In 1week, csll for appt appt upon discharge)    Specialty:  Internal Medicine   Contact information:   [address] [place] [address] [phone]        The results of significant diagnostics from this hospitalization (including imaging, microbiology, ancillary and laboratory) are listed below for reference.    Significant Diagnostic Studies: Ct Chest Wo Contrast  08/08/2013   CLINICAL DATA:  Substernal chest pain.  Hypotension.  Leukocytosis.  EXAM: CT CHEST WITHOUT CONTRAST  TECHNIQUE: Multidetector CT imaging of the chest was performed following the standard protocol without IV contrast.  COMPARISON:  Multiple exams, including 02/19/2013  FINDINGS: No pathologic thoracic adenopathy. Dependent subsegmental atelectasis in both lungs. No acute thoracic spine findings. Coronary atherosclerotic calcification noted. A right central line terminates in the SVC.  IMPRESSION: 1. Dependent subsegmental atelectasis in both lungs. 2. Coronary atherosclerosis.   Electronically Signed   By: Sherryl Barters M.D.   On: 08/08/2013 21:26   Nm Myocar Multi W/spect W/wall Motion / Ef  08/11/2013   EXAM: MYOCARDIAL IMAGING WITH SPECT (REST AND PHARMACOLOGIC-STRESS - 2 DAY PROTOCOL)  GATED LEFT VENTRICULAR WALL MOTION STUDY  LEFT VENTRICULAR EJECTION FRACTION  TECHNIQUE: Standard myocardial SPECT imaging was performed after resting intravenous injection of 10 mCi Tc-48msestamibi. Subsequently, on a second day, intravenous infusion of Lexiscan was performed under the supervision of the Cardiology staff. At peak effect of the drug, 30 mCi Tc-937mestamibi was injected intravenously and standard myocardial SPECT imaging was performed. Quantitative gated imaging was also performed to evaluate left ventricular wall motion, and estimate left ventricular  ejection fraction.  COMPARISON:  None.  FINDINGS: Stress data: Baseline EKG SR 84 bpm WIht infusion of Lexiscan there were no changes to reflect ischemia.  Nuclear data:  Patient studied in a 1 day rest stress protocol.  IN the initial stress images there was very mild thinning in the anterior/anterolateral wall (mid/distal)  IN the recovery images there was increased tracer activity in this region with near normalization;  On review of the raw data there is extensive soft tissue (breast, diaphragm, bowel acclivity) that surrounds heart.  IMPRESSION: Lexiscan myoview: Electrically negative for ischemia. Myoview scan with mild thinning in the mid/distal anterior/anterolateral wall May reflect mild ischemia, cannot exclude shifting soft tissue (breast).  LVEF on gating LVEF is calculated at greater than 70%.   Electronically Signed   By: PaDorris Carnes.D.   On: 08/11/2013 15:10   Nm Pulmonary Perf And Vent  08/08/2013   CLINICAL DATA:  Shortness of breath for 1 week. Chest pain. History of asthma.  EXAM: NUCLEAR MEDICINE VENTILATION - PERFUSION LUNG SCAN  TECHNIQUE: Ventilation images were obtained in multiple projections using inhaled aerosol technetium 99 M DTPA. Perfusion images were obtained in multiple projections after intravenous injection of Tc-9941mA.  RADIOPHARMACEUTICALS:  40 mCi Tc-38m32mA aerosol and 6 mCi Tc-38m 16m COMPARISON:  Portable chest 08/08/2013  FINDINGS: Ventilation: Homogeneous but diffusely decreased of distribution of activity throughout the lungs. Residual activity demonstrated in the esophagus and stomach. No focal filling defects.  Perfusion: No wedge shaped peripheral perfusion defects to suggest acute pulmonary embolism.  IMPRESSION: Low probability of pulmonary embolus.   Electronically Signed   By: WilliLucienne Capers   On: 08/08/2013 21:47   Dg Chest Port 1 View  08/10/2013   CLINICAL DATA:  Assess for airspace disease  EXAM: PORTABLE CHEST - 1 VIEW  COMPARISON:  08/08/2013  FINDINGS: Right jugular central venous catheter tip in the SVC is unchanged. Surgical clips in the right thyroid region.  Prominent heart size. Mild right lower lobe atelectasis. Negative for edema or effusion.  IMPRESSION: Mild right lower lobe atelectasis.   Electronically Signed   By: Franchot Gallo M.D.   On: 08/10/2013 09:22   Dg Chest Portable 1 View  08/08/2013   CLINICAL DATA:  Central line placement.  Shortness of breath.  EXAM: PORTABLE CHEST - 1 VIEW  COMPARISON:  Earlier today at 1610 hr.  FINDINGS: 1930 hr. Right internal jugular line terminates at the mid SVC. No pneumothorax.  Surgical changes of the right side of the thoracic inlet. Normal heart size. The inferior lateral left hemi thorax is excluded. Given this factor, no pleural fluid. Low lung volumes with resultant pulmonary interstitial prominence.  IMPRESSION: Right internal jugular line terminating at the mid SVC. No pneumothorax or other acute disease.  Partial exclusion of the inferior lateral left hemi thorax.   Electronically Signed   By: Abigail Miyamoto M.D.   On: 08/08/2013 19:44   Dg Chest Portable 1 View  08/08/2013   CLINICAL DATA:  SHORTNESS OF BREATH CHEST PAIN  EXAM: PORTABLE CHEST - 1 VIEW  COMPARISON:  Two-view chest in 02/18/2013  FINDINGS: Low lung volumes. The heart size and mediastinal contours are within normal limits. Both lungs are clear. The visualized skeletal structures are unremarkable.  IMPRESSION: No active disease.   Electronically Signed   By: Margaree Mackintosh M.D.   On: 08/08/2013 16:19    Microbiology: Recent Results (from the past 240 hour(s))  CULTURE, BLOOD (ROUTINE X 2)     Status: None   Collection Time    08/08/13  4:30 PM      Result Value Ref Range Status   Specimen Description BLOOD RIGHT HAND   Final   Special Requests BOTTLES DRAWN AEROBIC AND ANAEROBIC 5CC   Final   Culture  Setup Time     Final   Value: 08/08/2013 22:25     Performed at [facility]   Culture      Final   Value:        BLOOD CULTURE RECEIVED NO GROWTH TO DATE CULTURE WILL BE HELD FOR 5 DAYS BEFORE ISSUING A FINAL NEGATIVE REPORT     Performed at [facility]   Report Status PENDING   Incomplete  CULTURE, BLOOD (ROUTINE X 2)     Status: None   Collection Time    08/08/13  4:40 PM      Result Value Ref Range Status   Specimen Description BLOOD LEFT HAND   Final   Special Requests BOTTLES DRAWN AEROBIC AND ANAEROBIC 5CC   Final   Culture  Setup Time     Final   Value: 08/08/2013 22:26     Performed at [facility]   Culture     Final   Value:        BLOOD CULTURE RECEIVED NO GROWTH TO DATE CULTURE WILL BE HELD FOR 5 DAYS BEFORE ISSUING A FINAL NEGATIVE REPORT     Performed at [facility]   Report Status PENDING   Incomplete  MRSA PCR SCREENING     Status: None   Collection Time    08/08/13  9:10 PM      Result Value Ref Range Status   MRSA by PCR NEGATIVE  NEGATIVE Final   Comment:  The GeneXpert MRSA Assay (FDA     approved for NASAL specimens     only), is one component of a     comprehensive MRSA colonization     surveillance program. It is not     intended to diagnose MRSA     infection nor to guide or     monitor treatment for     MRSA infections.  URINE CULTURE     Status: None   Collection Time    08/08/13  9:30 PM      Result Value Ref Range Status   Specimen Description URINE, CLEAN CATCH   Final   Special Requests NONE   Final   Culture  Setup Time     Final   Value: 08/08/2013 20:00     Performed at [facility]     Final   Value: NO GROWTH     Performed at [facility]   Culture     Final   Value: NO GROWTH     Performed at [facility]   Report Status 08/09/2013 FINAL   Final     Labs: Basic Metabolic Panel:  Recent Labs Lab 08/08/13 1600 08/08/13 1611 08/08/13 1950 08/09/13 0400 08/10/13 0530 08/12/13 0530  NA 137 136* 137 140 141 136*  K 4.5 4.2 4.3 5.3 4.9 4.3   CL 95* 99 100 100 103 99  CO2 22  --   --  _0 GLUCOSE 147* 149* 185* 258* 163* 151*  BUN 41* 39* 36* 34* 31* 17  CREATININE 1.70* 1.80* 1.60* 1.25* 1.14* 1.04  CALCIUM 11.0*  --   --  9.7 8.7 9.0   Liver Function Tests: No results found for this basename: AST, ALT, ALKPHOS, BILITOT, PROT, ALBUMIN,  in the last 168 hours No results found for this basename: LIPASE, AMYLASE,  in the last 168 hours No results found for this basename: AMMONIA,  in the last 168 hours CBC:  Recent Labs Lab 08/08/13 1600  08/08/13 1950 08/09/13 0400 08/10/13 0530 08/11/13 0445 08/12/13 0530  WBC 24.5*  --   --  17.2* 11.9* 11.5* 11.9*  HGB 11.3*  < > 10.9* 9.3* 9.5* 9.5* 9.7*  HCT 34.9*  < > 32.0* 29.1* 29.3* 29.9* 30.0*  MCV 84.1  --   --  83.9 86.4 84.9 84.5  PLT 279  --   --  216 200 194 193  < > = values in this interval not displayed. Cardiac Enzymes:  Recent Labs Lab 08/08/13 1840 08/09/13 0143  TROPONINI <0.30 <0.30   BNP: BNP (last 3 results)  Recent Labs  08/08/13 1600  PROBNP 155.5*   CBG:  Recent Labs Lab 08/11/13 1130 08/11/13 1701 08/11/13 1951 08/12/13 0749 08/12/13 1216  GLUCAP 185* 152* 136* 156* 128*       Signed:  VIYUOH,ADELINE C  [facility] 08/12/2013, 2:51 PM

## 2013-08-14 LAB — CULTURE, BLOOD (ROUTINE X 2)
CULTURE: NO GROWTH
Culture: NO GROWTH

## 2013-08-17 IMAGING — MR MR BREAST BILATERAL W WO CONTRAST
8 of 13 series · 31 of 48 positions shown · IV contrast (20cc Multihance)
Comparison: Bilateral screening mammogram 01/20/2012, diagnostic
right mammogram right breast ultrasound 02/13/2012 and, post clip
right mammogram 02/13/2012

CLINICAL DATA: Recent diagnosis of right breast DCIS following
right breast ultrasound guided core needle biopsy.  A right
axillary lymph node biopsied showed no evidence of metastatic
disease, and normal lymph node tissue.

BUN and creatinine were obtained on site at [HOSPITAL] at
[HOSPITAL]..
Results:  BUN 17 mg/dL,  Creatinine 1.0 mg/dL.
BILATERAL BREAST MRI WITH AND WITHOUT CONTRAST
TECHNIQUE: Multiplanar, multisequence MR images of both breasts
were obtained prior to and following the intravenous administration
of 20ml of Multihance.  Three dimensional images were evaluated at
the independent DynaCad workstation.

[Series 5: t2_tirm_tra ipat (a-p) · axial · 3.0mm · 0.80mm/px · z∈[-127,+50]mm · 3 of 60 slices shown]
[im 1/60]
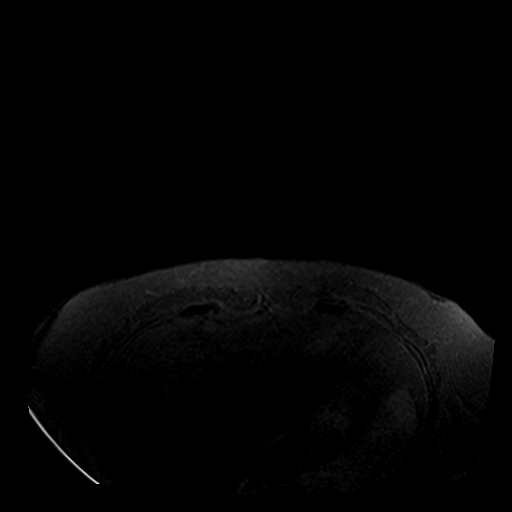
[im 30/60]
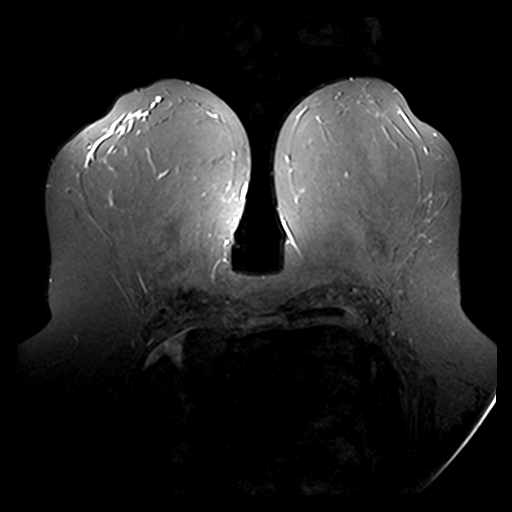
[im 60/60]
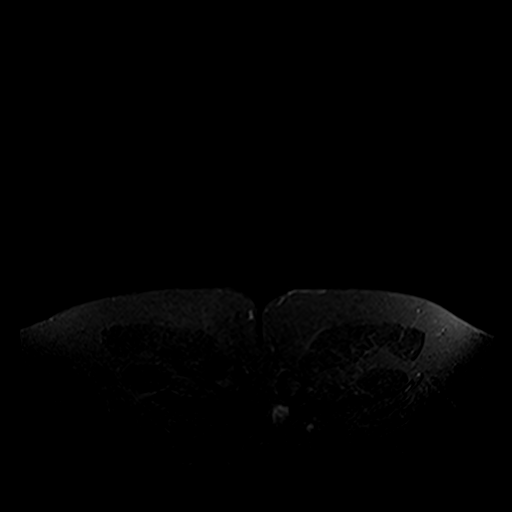

[Series 6: T2 · axial · 3.0mm · 1.07mm/px · z∈[-127,+50]mm · 2 of 60 slices shown]
[im 1/60]
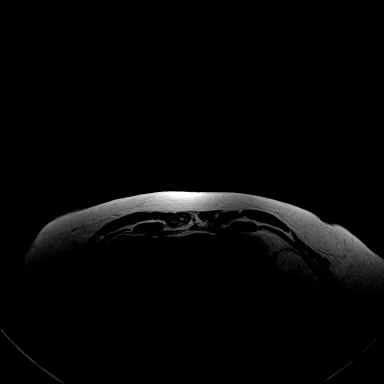
[im 60/60]
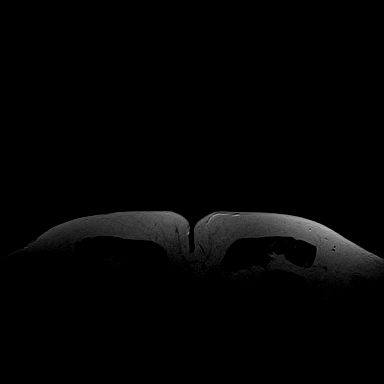

[Series 7: fl3d pre-cm no · axial · non-contrast · 1.2mm · 1.07mm/px · z∈[-134,+57]mm · 5 of 160 slices shown]
[im 1/160]
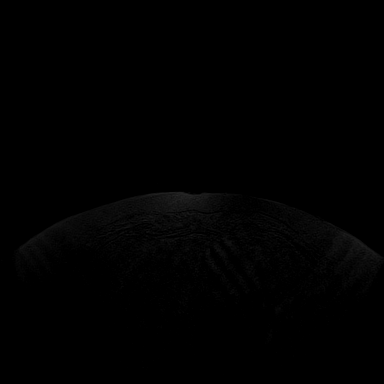
[im 40/160]
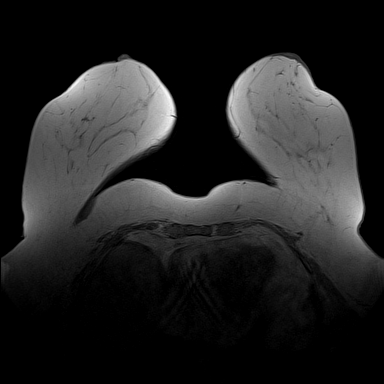
[im 80/160]
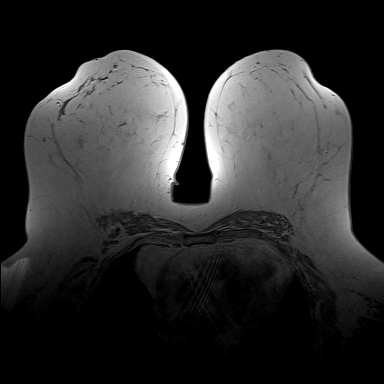
[im 120/160]
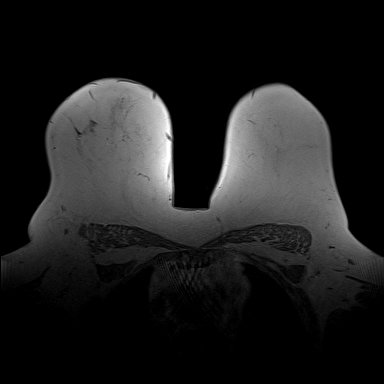
[im 160/160]
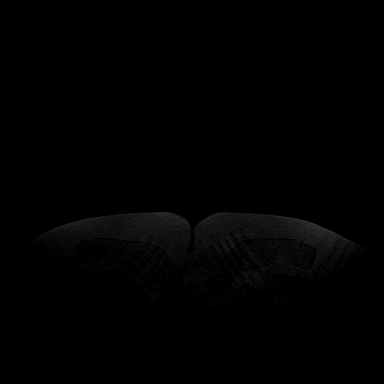

[Series 8: fl3d pre-cm · axial · non-contrast · 1.2mm · 1.07mm/px · z∈[-134,+57]mm · 5 of 160 slices shown]
[im 1/160]
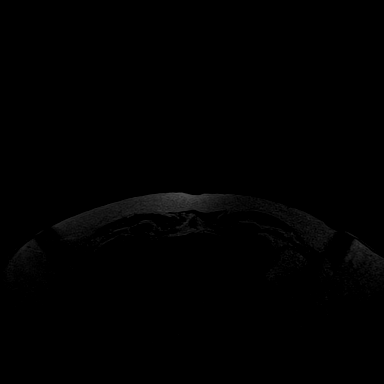
[im 40/160]
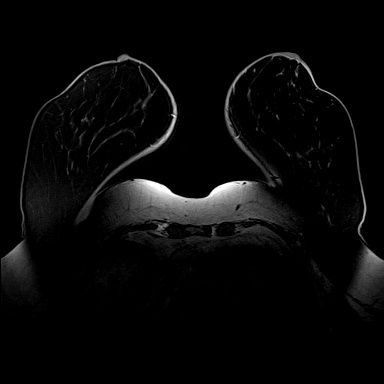
[im 80/160]
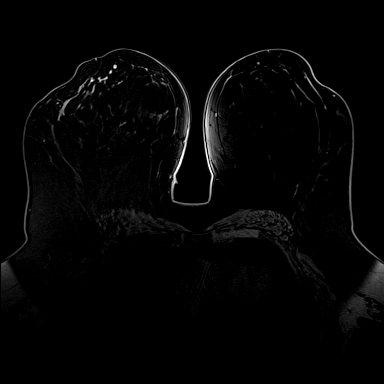
[im 120/160]
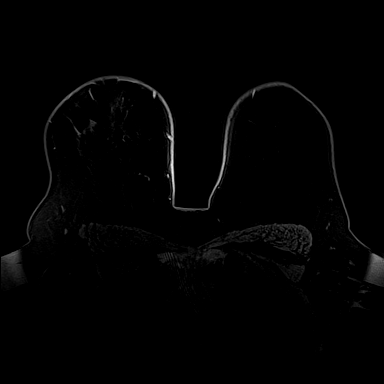
[im 160/160]
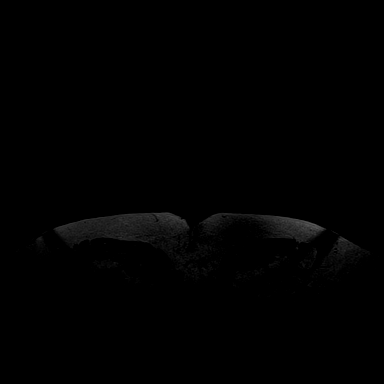

[Series 9: fl3d post-cm 20 · axial · 1.2mm · 1.07mm/px · z∈[-134,+57]mm · 5 of 160 slices shown (1 of 3)]
[im 1/160]
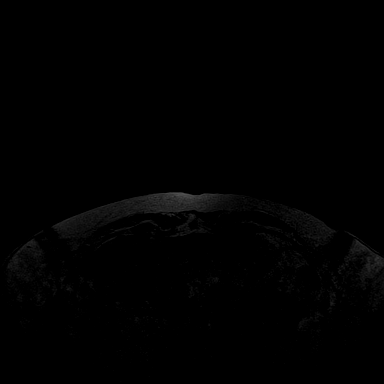
[im 40/160]
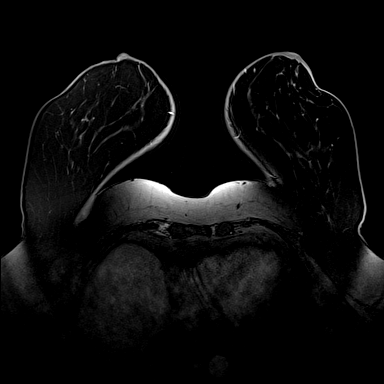
[im 80/160]
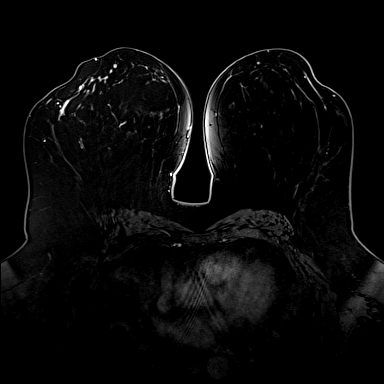
[im 120/160]
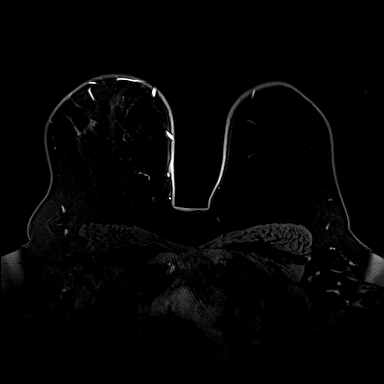
[im 160/160]
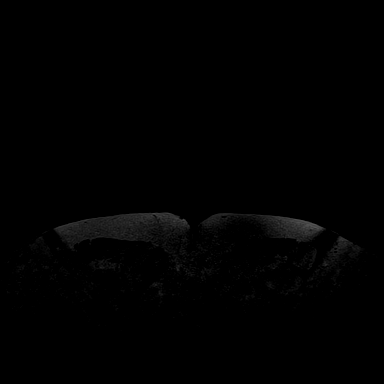

[Series 10: fl3d post-cm 20 · axial · 1.2mm · 1.07mm/px · z∈[-134,+57]mm · 5 of 160 slices shown (2 of 3)]
[im 1/160]
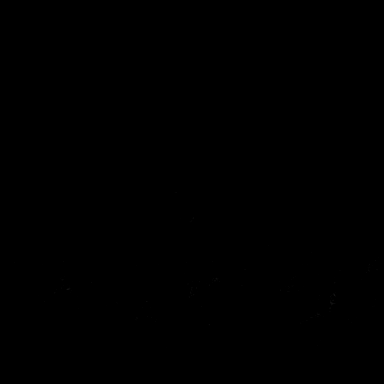
[im 40/160]
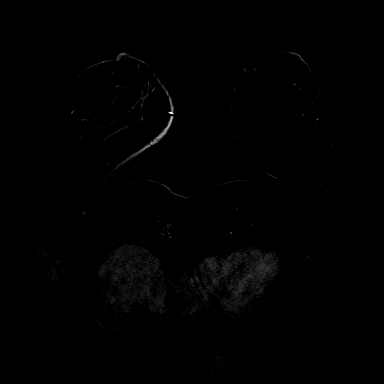
[im 80/160]
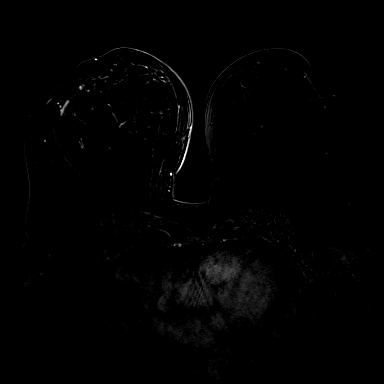
[im 120/160]
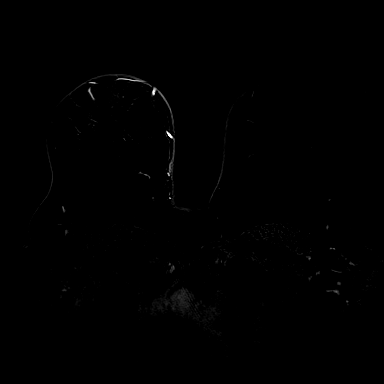
[im 160/160]
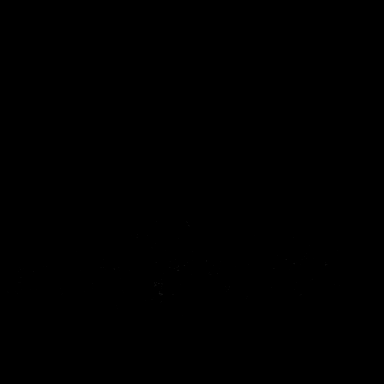

[Series 11: fl3d post-cm 20 · axial · 192.0mm · 1.07mm/px · 1 of 1 slices shown (3 of 3)]
[im 1/1]
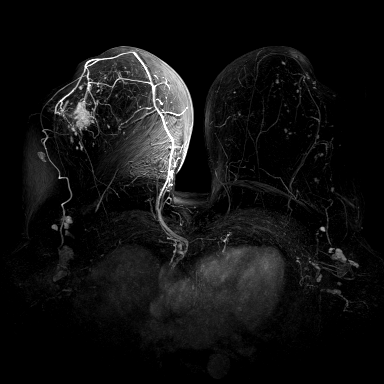

[Series 12: fl3d post-cm 3min · axial · 1.2mm · 1.07mm/px · z∈[-134,+57]mm · 5 of 160 slices shown]
[im 1/160]
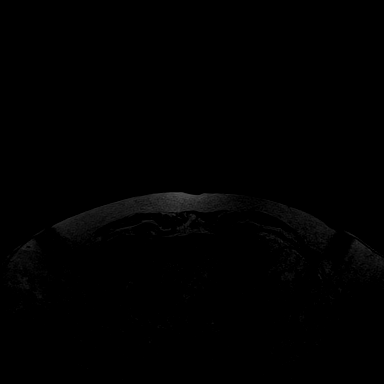
[im 40/160]
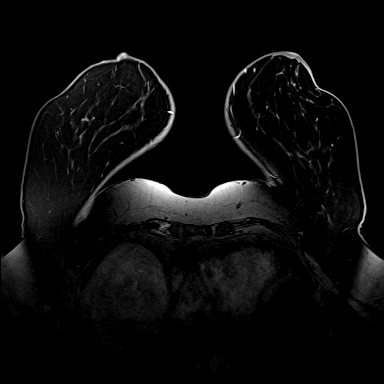
[im 80/160]
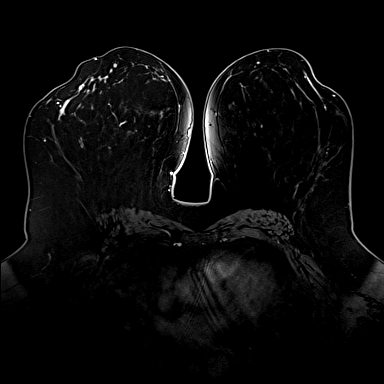
[im 120/160]
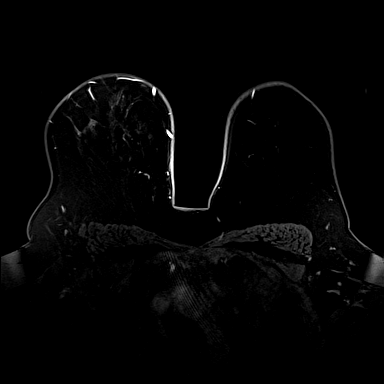
[im 160/160]
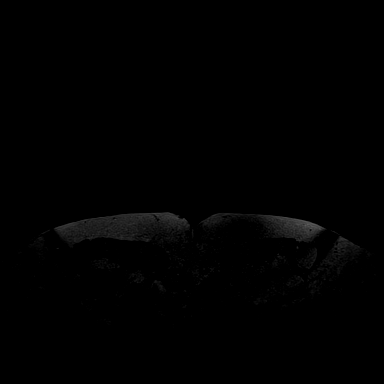

[31 of 48 positions shown; findings below may reference images not displayed]

FINDINGS: In the middle third of the upper outer quadrant of the
right breast is an irregular markedly enhancing mass with washout
kinetics that measures 2.4 x 1.7 x 1.9 cm.  Biopsy clip artifact is
seen within the center of the mass.  No additional suspicious areas
enhance are seen in the right breast.

No mass or suspicious enhancement is identified in the left breast
to suggest malignancy.

No axillary or internal mammary chain lymphadenopathy is
identified.
IMPRESSION: 1. Solitary 2.4 x 1.7 x 1.9 cm right upper outer quadrant enhancing
mass with central biopsy clip artifact corresponds to biopsy-proven
ductal carcinoma in situ.
2.  No MRI evidence of malignancy in the left breast.

RECOMMENDATION:
Treatment planning of the right breast.

THREE-DIMENSIONAL MR IMAGE RENDERING ON INDEPENDENT WORKSTATION:

Three-dimensional MR images were rendered by post-processing of the
original MR data on an independent workstation.  The three-
dimensional MR images were interpreted, and findings were reported
in the accompanying complete MRI report for this study.

BI-RADS CATEGORY 6:  Known biopsy-proven malignancy - appropriate
action should be taken.

## 2013-08-29 ENCOUNTER — Encounter: Payer: Self-pay | Admitting: Internal Medicine

## 2013-09-02 ENCOUNTER — Encounter: Payer: Self-pay | Admitting: Cardiovascular Disease

## 2013-09-02 ENCOUNTER — Ambulatory Visit (INDEPENDENT_AMBULATORY_CARE_PROVIDER_SITE_OTHER): Payer: Medicare Other | Admitting: Cardiovascular Disease

## 2013-09-02 VITALS — BP 120/68 | HR 108 | Ht 65.0 in | Wt 301.4 lb

## 2013-09-02 DIAGNOSIS — R0789 Other chest pain: Secondary | ICD-10-CM | POA: Insufficient documentation

## 2013-09-02 NOTE — Patient Instructions (Signed)
Your physician recommends that you continue on your current medications as directed. Please refer to the Current Medication list given to you today.  Your physician recommends that you schedule a follow-up appointment in: as needed with Dr. Acie Fredrickson

## 2013-09-02 NOTE — Assessment & Plan Note (Addendum)
[name] presents today for followup of her recent hospitalization for sepsis syndrome. She had severe lactic acidosis and complained of some chest discomfort. She ruled out for myocardial infarction. A stress Myoview study was low risk features mild anterior apical thinning that was thought to be due to shifting breast artifact.  . Since that time she's not had any episodes of chest discomfort.  At this point that she is stable from a cardiac standpoint. She will followup with her general medical doctor. We'll see her on an as-needed basis. I've encouraged her to continue with a good diet and exercise program as tolerated

## 2013-09-02 NOTE — Progress Notes (Signed)
[name] Date of Birth  29-Feb-1955       [facility] Office [address], [address], [place], [place]  [address]   [place], [place]  [address] [place]   Fax  [phone]     Fax [phone]  Problem List: 1. Hospitalization for sepsis syndrome 2. Acute on chronic renal insufficiency 3. Hypertension 4. Diabetes mellitus 5. Chest pain-low risk Myoview study with anterior apical thinning. Ejection fraction 70%  History of Present Illness:  [name] is a 58 year old female who in the hospital several weeks ago. She was admitted with sepsis syndrome. She had significant lactic acidosis. She had been having some chest discomfort.  Troponin levels were all negative.   A stress Myoview study showed some anterior apical thinning which was thought to be due to breast artifact. Her left systolic function was normal.  She's done very well since her hospitalization. She still has lots of fatigue.  She's not had any additional episodes of chest discomfort.  Current Outpatient Prescriptions on File Prior to Visit  Medication Sig Dispense Refill  . anastrozole (ARIMIDEX) 1 MG tablet TAKE ONE TABLET BY MOUTH ONCE DAILY  90 tablet  4  . ARIPiprazole (ABILIFY) 10 MG tablet Take 10 mg by mouth daily.       [name] aspirin EC 81 MG tablet Take 1 tablet (81 mg total) by mouth daily.  30 tablet  0  . CALCIUM PO Take 1 tablet by mouth daily.       . cholecalciferol (VITAMIN D) 1000 UNITS tablet Take 1 tablet (1,000 Units total) by mouth daily.  30 tablet  12  . desonide (DESOWEN) 0.05 % ointment Apply 1 application topically 2 (two) times daily.      . diclofenac sodium (VOLTAREN) 1 % GEL Apply 2 g topically 4 (four) times daily.      [name] esomeprazole (NEXIUM) 40 MG capsule Take 40 mg by mouth daily before breakfast.      . gabapentin (NEURONTIN) 100 MG capsule Take 100 mg by mouth as directed. takes 16m at breakfast, 1040mat  lunch and 30056mt bedtime      . insulin aspart (NOVOLOG) 100 UNIT/ML injection Inject 2 Units into the skin 3 (three) times daily before meals. Sliding scale      . Insulin Detemir (LEVEMIR FLEXPEN) 100 UNIT/ML Pen Inject 15 Units into the skin at bedtime.      . lMarland Kitchenvothyroxine (SYNTHROID, LEVOTHROID) 200 MCG tablet Take 200 mcg by mouth daily before breakfast.       . levothyroxine (SYNTHROID, LEVOTHROID) 25 MCG tablet Take 25 mcg by mouth daily before breakfast.      . lisinopril (PRINIVIL,ZESTRIL) 2.5 MG tablet Take 2.5 mg by mouth daily.      . Multiple Vitamin (MULTIVITAMIN WITH MINERALS) TABS Take 1 tablet by mouth daily.      . PMarland KitchenRoxetine (PAXIL) 20 MG tablet Take 20 mg by mouth daily.       . rosuvastatin (CRESTOR) 20 MG tablet Take 20 mg by mouth daily.      . sitaGLIPtin-metformin (JANUMET) 50-500 MG per tablet Take 1 tablet by mouth 2 (two) times daily with a meal.      . traMADol (ULTRAM) 50 MG tablet Take 50 mg by mouth every 6 (six) hours as needed for moderate pain.       . traZODone (DESYREL) 100 MG tablet Take  100 mg by mouth at bedtime.      . valACYclovir (VALTREX) 1000 MG tablet Take 1,000 mg by mouth daily.      . valsartan-hydrochlorothiazide (DIOVAN-HCT) 160-12.5 MG per tablet Take 1 tablet by mouth daily.        No current facility-administered medications on file prior to visit.    Allergies  Allergen Reactions  . Shellfish Allergy Shortness Of Breath  . Bee Venom Hives  . Morphine And Related Other (See Comments)    Overly sedated  . Oxycodone Hcl Hives    Past Medical History  Diagnosis Date  . Depression   . Thyroid disease   . GERD (gastroesophageal reflux disease)   . Pancreatitis     hx of  . Knee pain, bilateral   . Obesity   . HSV (herpes simplex virus) infection   . Hyperlipemia   . Asthma   . Tachycardia     with sx in 2008  . Breast mass in female     L breast 2008, US showed likely fat necrosis  . Arthritis   . Anxiety   . Heartburn     . Hypertension     sees Dr. Criss Rosales , [facility]  . Hx of radiation therapy 05/05/12- 07/15/12    right breast, 45 gray x 25 fx, lumpectomy cavity boosted to 16.2 gray  . Sleep apnea     uses cpap, pt does not know settings  . History of kidney stones   . Headache(784.0)   . 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  . Anemia   . History of blood transfusion 2011  . Hypothyroidism   . Diabetes mellitus   . 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 Polyneuropathy in diabetes(357.2)   . Benign paroxysmal positional vertigo 12/30/2012    Past Surgical History  Procedure Laterality Date  . Replacement total knee bilateral Bilateral yrs ago  . Foot surgery Right 10 yrs ago  . Abdominal surgery    . Thyroid surgery  yrs ago  . Joint replacement    . Abdominal hysterectomy  yrs ago    partial  . Breast surgery Right   . Breast lumpectomy  03/03/2012    Procedure: LUMPECTOMY;  Surgeon: Haywood Lasso, MD;  Location: [facility];  Service: General;  Laterality: Right;  . Cholecystectomy  yrs ago  . Colonoscopy with propofol N/A 09/28/2012    Procedure: COLONOSCOPY WITH PROPOFOL;  Surgeon: Lear Ng, MD;  Location: [facility];  Service: Endoscopy;  Laterality: N/A;    History  Smoking status  . Former Smoker -- 1.00 packs/day for 7 years  . Types: Cigarettes  . Quit date: 03/24/1992  Smokeless tobacco  . Never Used    History  Alcohol Use No    Family History  Problem Relation Age of Onset  . Heart disease Brother     Multiple MIs, starting in his 74s  . Diabetes Brother   . Hypertension Mother   . Diabetes Mother   . Breast cancer Mother 31  . Bone cancer Mother   . Hypertension Sister   . Diabetes Sister   . Breast cancer Sister 41  . Breast cancer Maternal Grandmother   . Heart disease Maternal Grandmother   . Uterine cancer Other 19  . Breast cancer Paternal Aunt 45  . Breast cancer Paternal Grandmother      dx in her 32s  . Prostate cancer Paternal  Grandfather     Reviw of Systems:  Reviewed in the HPI.  All other systems are negative.  Physical Exam: Blood pressure 120/68, pulse 108, height 5' 5" (1.651 m), weight 301 lb 6.4 oz (136.714 kg). Wt Readings from Last 3 Encounters:  09/02/13 301 lb 6.4 oz (136.714 kg)  08/12/13 301 lb 4.8 oz (136.669 kg)  06/27/13 308 lb 12.8 oz (140.071 kg)     General: Well developed, well nourished, in no acute distress.  Head: Normocephalic, atraumatic, sclera non-icteric, mucus membranes are moist,   Neck: Supple. Carotids are 2 + without bruits. No JVD   Lungs: Clear   Heart: RR, normal S1S2  Abdomen: Soft, non-tender, non-distended with normal bowel sounds.  Msk:  Strength and tone are normal   Extremities: No clubbing or cyanosis. No edema.  Distal pedal pulses are 2+ and equal    Neuro: CN II - XII intact.  Alert and oriented X 3.   Psych:  Normal  ECG:   Assessment / Plan:

## 2013-09-14 ENCOUNTER — Telehealth: Payer: Self-pay | Admitting: Oncology

## 2013-09-14 NOTE — Telephone Encounter (Signed)
[name] called and said she has pain and swelling in her right breast.  She said she had the swelling before and went to physical therapy for it.  Advised her that Dr. Sondra Come will be notified and she will receive a call back.

## 2013-09-15 ENCOUNTER — Telehealth: Payer: Self-pay | Admitting: Oncology

## 2013-09-15 ENCOUNTER — Other Ambulatory Visit: Payer: Self-pay | Admitting: Oncology

## 2013-09-15 DIAGNOSIS — C50411 Malignant neoplasm of upper-outer quadrant of right female breast: Secondary | ICD-10-CM

## 2013-09-15 NOTE — Telephone Encounter (Signed)
Called [name] back and let her know that a referral to the Lymphedema clinic has been entered for her.  She stated that this helped her with the breast swelling/soreness in the past.  Advised her to call back to be seen if the swelling or pain in her breast worsens.  She verbalized agreement.

## 2013-09-16 ENCOUNTER — Telehealth: Payer: Self-pay

## 2013-09-16 NOTE — Telephone Encounter (Signed)
Appointment for physical therapy scheduled for 09/28/13 at 1:45 pm.called patient to inform of this and she states she needs to reschedule.Gave her the phone number [phone] and address [address]Patient was Patent attorney.

## 2013-09-28 ENCOUNTER — Ambulatory Visit: Payer: Medicare Other | Admitting: Physical Therapy

## 2013-10-17 ENCOUNTER — Ambulatory Visit: Payer: Medicare Other | Attending: Physical Therapy | Admitting: Physical Therapy

## 2013-10-17 DIAGNOSIS — IMO0001 Reserved for inherently not codable concepts without codable children: Secondary | ICD-10-CM | POA: Diagnosis present

## 2013-10-17 DIAGNOSIS — I890 Lymphedema, not elsewhere classified: Secondary | ICD-10-CM | POA: Diagnosis not present

## 2013-10-17 DIAGNOSIS — N644 Mastodynia: Secondary | ICD-10-CM | POA: Diagnosis not present

## 2013-10-17 DIAGNOSIS — Z853 Personal history of malignant neoplasm of breast: Secondary | ICD-10-CM | POA: Insufficient documentation

## 2013-10-18 IMAGING — CT CT HEAD W/O CM
4 of 7 series · 16 of 40 positions shown, 18 images · non-contrast
Comparison: Head CT 06/03/2011.

CT HEAD

CLINICAL DATA: History of trauma to the head complaining of head
and neck pain.

CT HEAD WITHOUT CONTRAST
CT CERVICAL SPINE WITHOUT CONTRAST
TECHNIQUE: Multidetector CT imaging of the head and cervical spine
was performed following the standard protocol without intravenous
contrast.  Multiplanar CT image reconstructions of the cervical
spine were also generated.

[Series 3: recon 2: brain · axial · 0.49mm/px · z∈[+336,+438]mm · 3 of 72 slices shown]
[im 18/72  brain]
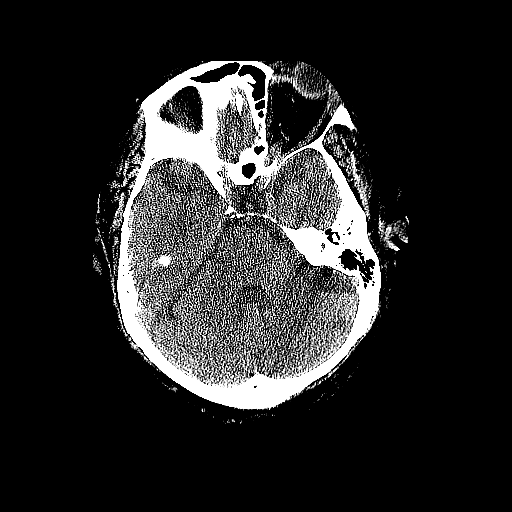
[im 36/72  brain]
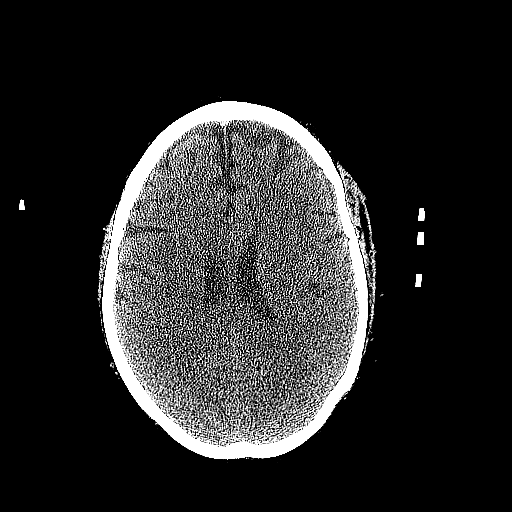
[im 54/72  brain]
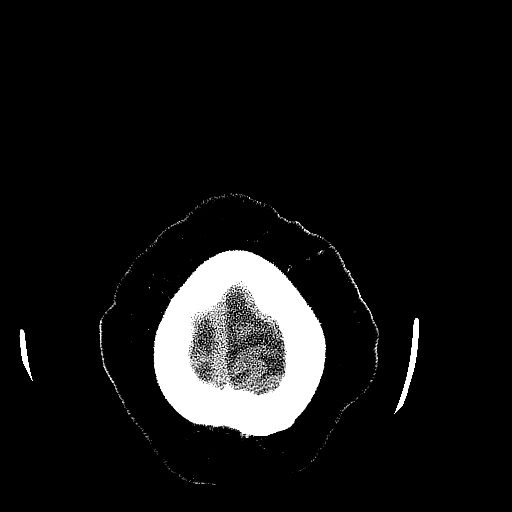

[Series 4: cervical spine · axial · 0.35mm/px · z∈[+115,+235]mm · 4 of 81 slices shown]
[im 17/81  brain]
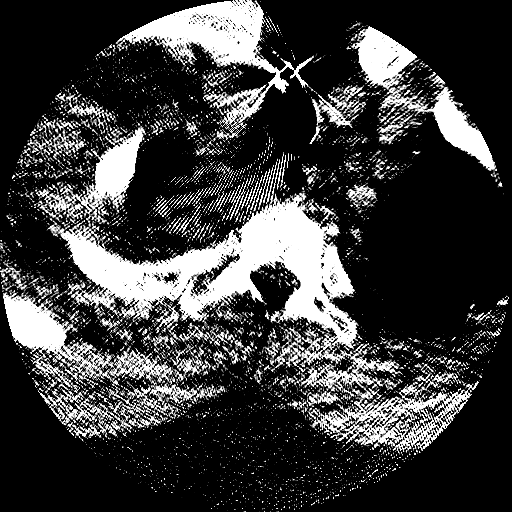
[im 33/81  brain]
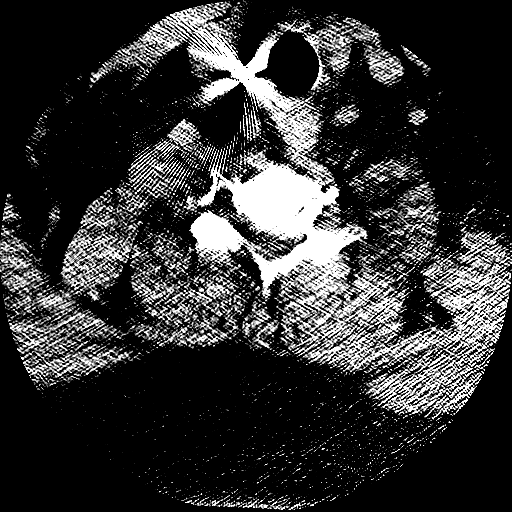
[im 49/81  brain]
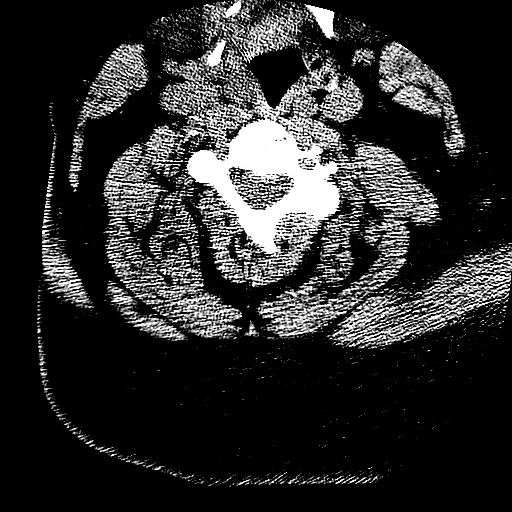
[im 65/81  brain]
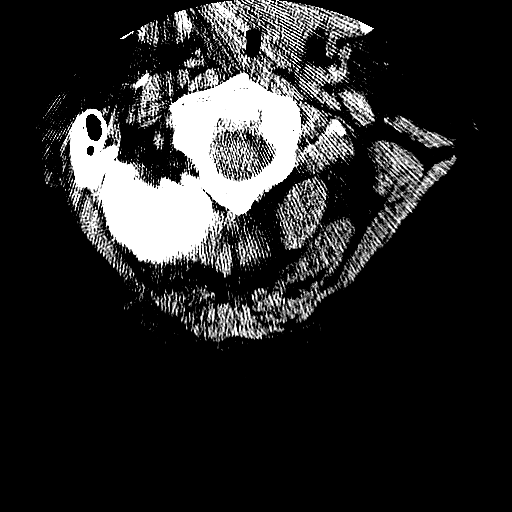

[Series 601: cor · coronal · 0.40mm/px · 3 of 45 slices shown]
[im 15/45  brain]
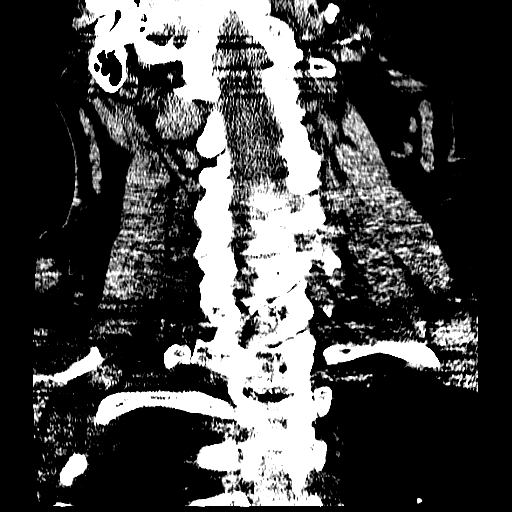
[im 20/45  brain]
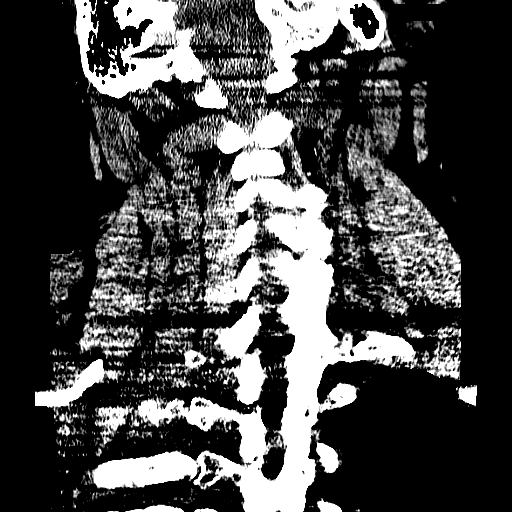
[im 25/45  brain]
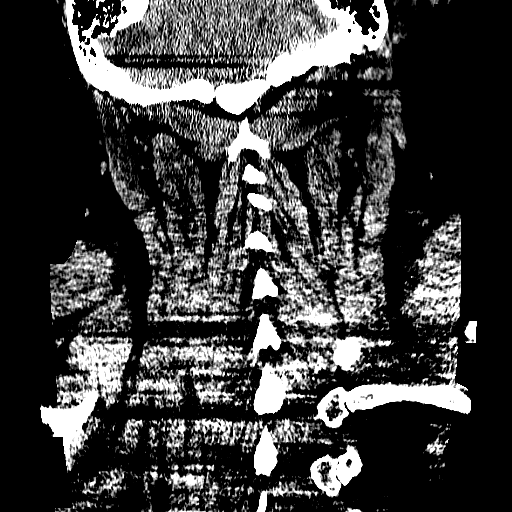

[Series 602: orthog · axial · 0.40mm/px · z∈[+64,+199]mm · 6 of 107 slices shown, 8 images]
[im 16/107  brain]
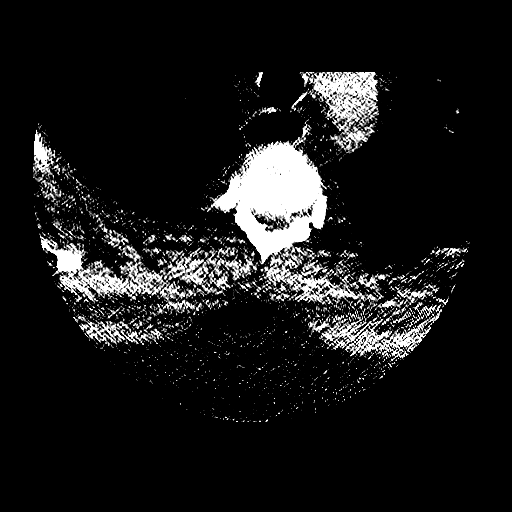
[im 16/107  bone]
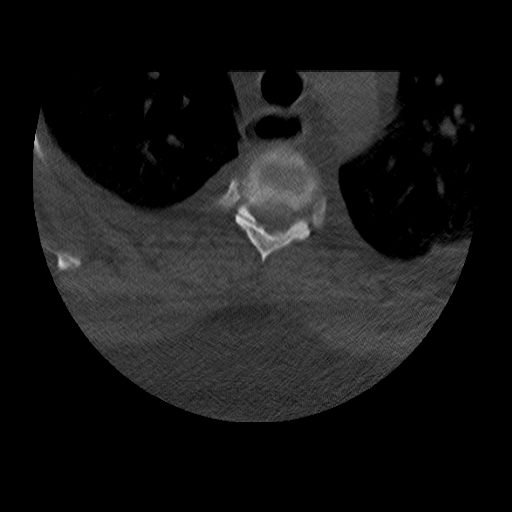
[im 31/107  brain]
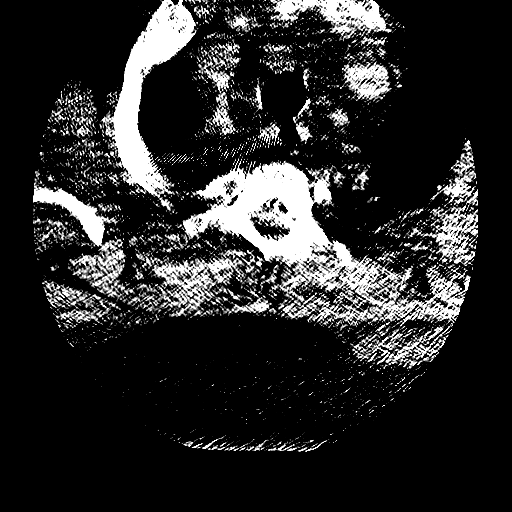
[im 46/107  brain]
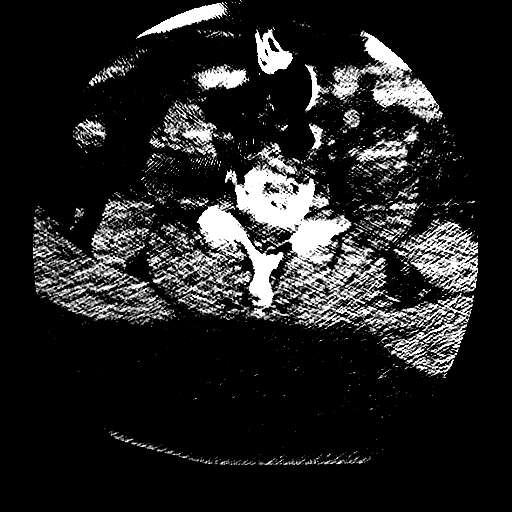
[im 61/107  brain]
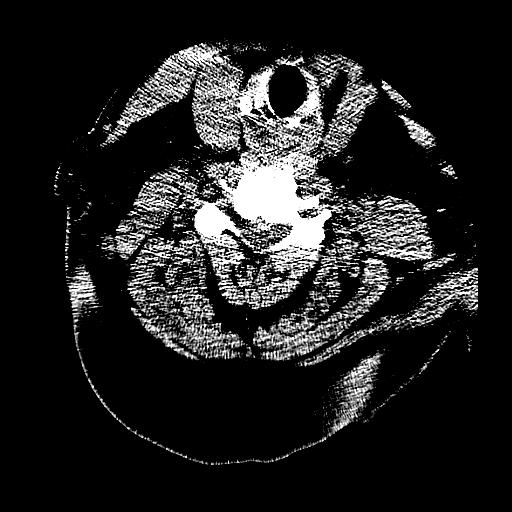
[im 76/107  brain]
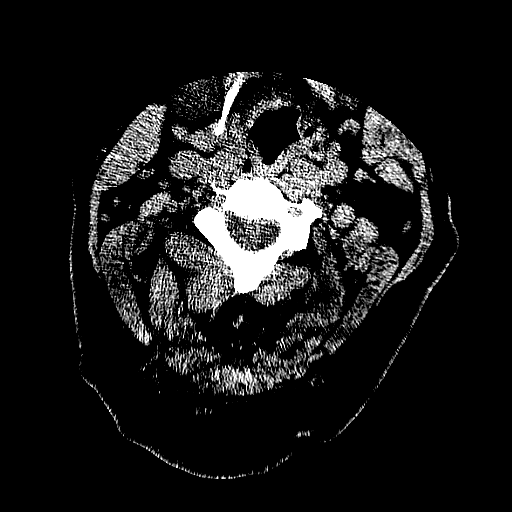
[im 76/107  bone]
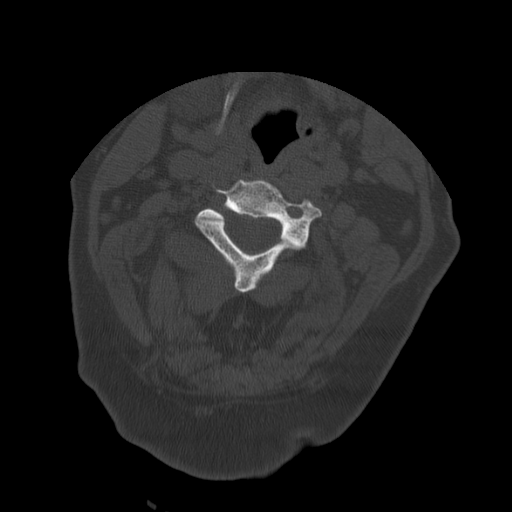
[im 91/107  brain]
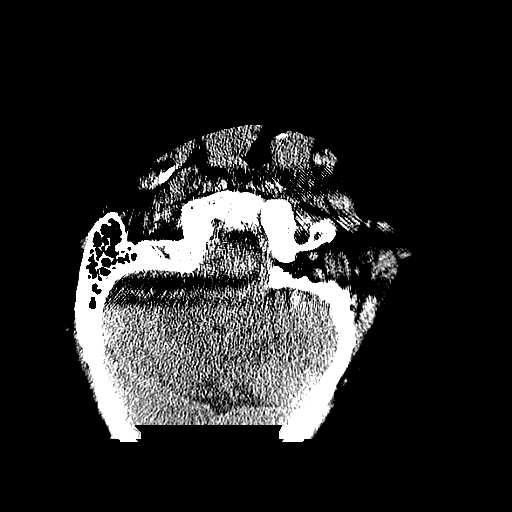

[16 of 40 positions shown; findings below may reference images not displayed]

FINDINGS: No acute displaced skull fractures are identified.  No
acute intracranial abnormality.  Specifically, no evidence of acute
post-traumatic intracranial hemorrhage, no definite regions of
acute/subacute cerebral ischemia, no focal mass, mass effect,
hydrocephalus or abnormal intra or extra-axial fluid collections.
The visualized paranasal sinuses and mastoids are well pneumatized.
IMPRESSION: 1.  No acute displaced skull fractures or acute intracranial
abnormalities.
2.  The appearance of the brain is normal.

CT CERVICAL SPINE
FINDINGS: There are no acute displaced cervical spine fractures.
Mild straightening of normal cervical lordosis is presumably
positional.  Alignment is otherwise anatomic.  Prevertebral soft
tissues are normal.  Multilevel degenerative disc disease is noted,
most severe C4-C5, C5-C6 and C6-C7.  Mild multilevel facet
arthropathy is also noted.  Visualized portions of the upper thorax
are remarkable for some surgical clips at the level of the thoracic
inlet, likely related to prior thyroid surgery.
IMPRESSION: 1.  No evidence of significant acute traumatic injury to the
cervical spine.
2.  Moderate multilevel degenerative disc disease and cervical
spondylosis, as above.

## 2013-10-31 ENCOUNTER — Encounter: Payer: Medicare Other | Admitting: Physical Therapy

## 2013-11-07 ENCOUNTER — Ambulatory Visit: Payer: Medicare Other | Attending: Physical Therapy | Admitting: Physical Therapy

## 2013-11-07 DIAGNOSIS — I890 Lymphedema, not elsewhere classified: Secondary | ICD-10-CM | POA: Diagnosis not present

## 2013-11-07 DIAGNOSIS — N644 Mastodynia: Secondary | ICD-10-CM | POA: Diagnosis not present

## 2013-11-07 DIAGNOSIS — Z853 Personal history of malignant neoplasm of breast: Secondary | ICD-10-CM | POA: Insufficient documentation

## 2013-11-07 DIAGNOSIS — IMO0001 Reserved for inherently not codable concepts without codable children: Secondary | ICD-10-CM | POA: Insufficient documentation

## 2013-11-13 ENCOUNTER — Observation Stay: Payer: Self-pay | Admitting: Specialist

## 2013-11-13 LAB — CBC WITH DIFFERENTIAL/PLATELET
BASOS ABS: 0.1 10*3/uL (ref 0.0–0.1)
Basophil %: 0.7 %
Eosinophil #: 1.1 10*3/uL — ABNORMAL HIGH (ref 0.0–0.7)
Eosinophil %: 11.1 %
HCT: 32.1 % — ABNORMAL LOW (ref 35.0–47.0)
HGB: 10.2 g/dL — ABNORMAL LOW (ref 12.0–16.0)
LYMPHS PCT: 11.7 %
Lymphocyte #: 1.1 10*3/uL (ref 1.0–3.6)
MCH: 27.3 pg (ref 26.0–34.0)
MCHC: 31.7 g/dL — ABNORMAL LOW (ref 32.0–36.0)
MCV: 86 fL (ref 80–100)
Monocyte #: 0.5 x10 3/mm (ref 0.2–0.9)
Monocyte %: 4.9 %
NEUTROS ABS: 6.9 10*3/uL — AB (ref 1.4–6.5)
NEUTROS PCT: 71.6 %
PLATELETS: 176 10*3/uL (ref 150–440)
RBC: 3.72 10*6/uL — ABNORMAL LOW (ref 3.80–5.20)
RDW: 16 % — ABNORMAL HIGH (ref 11.5–14.5)
WBC: 9.7 10*3/uL (ref 3.6–11.0)

## 2013-11-13 LAB — URINALYSIS, COMPLETE
Bacteria: NONE SEEN
Bilirubin,UR: NEGATIVE
Blood: NEGATIVE
Glucose,UR: NEGATIVE mg/dL (ref 0–75)
KETONE: NEGATIVE
NITRITE: NEGATIVE
Ph: 5 (ref 4.5–8.0)
Protein: NEGATIVE
RBC,UR: 2 /HPF (ref 0–5)
SPECIFIC GRAVITY: 1.017 (ref 1.003–1.030)

## 2013-11-13 LAB — PHOSPHORUS: PHOSPHORUS: 4.8 mg/dL (ref 2.5–4.9)

## 2013-11-13 LAB — PROTIME-INR
INR: 1
PROTHROMBIN TIME: 12.6 s (ref 11.5–14.7)

## 2013-11-13 LAB — TROPONIN I: Troponin-I: <0.02 ng/mL

## 2013-11-13 LAB — BASIC METABOLIC PANEL
Anion Gap: 7 (ref 7–16)
BUN: 34 mg/dL — ABNORMAL HIGH (ref 7–18)
CHLORIDE: 105 mmol/L (ref 98–107)
Calcium, Total: 9.2 mg/dL (ref 8.5–10.1)
Co2: 27 mmol/L (ref 21–32)
Creatinine: 1.6 mg/dL — ABNORMAL HIGH (ref 0.60–1.30)
EGFR (Non-African Amer.): 35 — ABNORMAL LOW
GFR CALC AF AMER: 43 — AB
GLUCOSE: 113 mg/dL — AB (ref 65–99)
Osmolality: 286 (ref 275–301)
POTASSIUM: 4.7 mmol/L (ref 3.5–5.1)
Sodium: 139 mmol/L (ref 136–145)

## 2013-11-13 LAB — RAPID INFLUENZA A&B ANTIGENS (ARMC ONLY)

## 2013-11-13 LAB — MAGNESIUM: MAGNESIUM: 1.5 mg/dL — AB

## 2013-11-14 ENCOUNTER — Ambulatory Visit: Payer: Medicare Other | Admitting: Physical Therapy

## 2013-11-14 LAB — BASIC METABOLIC PANEL
Anion Gap: 6 — ABNORMAL LOW (ref 7–16)
BUN: 32 mg/dL — AB (ref 7–18)
CHLORIDE: 111 mmol/L — AB (ref 98–107)
Calcium, Total: 8.3 mg/dL — ABNORMAL LOW (ref 8.5–10.1)
Co2: 22 mmol/L (ref 21–32)
Creatinine: 1.43 mg/dL — ABNORMAL HIGH (ref 0.60–1.30)
EGFR (African American): 49 — ABNORMAL LOW
EGFR (Non-African Amer.): 40 — ABNORMAL LOW
GLUCOSE: 155 mg/dL — AB (ref 65–99)
OSMOLALITY: 288 (ref 275–301)
POTASSIUM: 5.3 mmol/L — AB (ref 3.5–5.1)
SODIUM: 139 mmol/L (ref 136–145)

## 2013-11-14 LAB — CBC WITH DIFFERENTIAL/PLATELET
BASOS ABS: 0 10*3/uL (ref 0.0–0.1)
Basophil %: 0.2 %
EOS PCT: 0.2 %
Eosinophil #: 0 10*3/uL (ref 0.0–0.7)
HCT: 26.6 % — ABNORMAL LOW (ref 35.0–47.0)
HGB: 8.5 g/dL — ABNORMAL LOW (ref 12.0–16.0)
Lymphocyte #: 0.8 10*3/uL — ABNORMAL LOW (ref 1.0–3.6)
Lymphocyte %: 11.6 %
MCH: 27 pg (ref 26.0–34.0)
MCHC: 32 g/dL (ref 32.0–36.0)
MCV: 85 fL (ref 80–100)
MONO ABS: 0.3 x10 3/mm (ref 0.2–0.9)
MONOS PCT: 3.7 %
NEUTROS PCT: 84.3 %
Neutrophil #: 6.1 10*3/uL (ref 1.4–6.5)
Platelet: 164 10*3/uL (ref 150–440)
RBC: 3.15 10*6/uL — ABNORMAL LOW (ref 3.80–5.20)
RDW: 15.8 % — AB (ref 11.5–14.5)
WBC: 7.2 10*3/uL (ref 3.6–11.0)

## 2013-11-14 LAB — URINE CULTURE

## 2013-11-14 LAB — MAGNESIUM: Magnesium: 2.2 mg/dL

## 2013-11-15 LAB — BASIC METABOLIC PANEL
Anion Gap: 9 (ref 7–16)
BUN: 27 mg/dL — AB (ref 7–18)
CALCIUM: 8.1 mg/dL — AB (ref 8.5–10.1)
CHLORIDE: 115 mmol/L — AB (ref 98–107)
CREATININE: 1.36 mg/dL — AB (ref 0.60–1.30)
Co2: 20 mmol/L — ABNORMAL LOW (ref 21–32)
EGFR (African American): 50 — ABNORMAL LOW
EGFR (Non-African Amer.): 41 — ABNORMAL LOW
Glucose: 107 mg/dL — ABNORMAL HIGH (ref 65–99)
Osmolality: 292 (ref 275–301)
POTASSIUM: 4.3 mmol/L (ref 3.5–5.1)
Sodium: 144 mmol/L (ref 136–145)

## 2013-11-16 LAB — EXPECTORATED SPUTUM ASSESSMENT W GRAM STAIN, RFLX TO RESP C

## 2013-11-18 LAB — CULTURE, BLOOD (SINGLE)

## 2013-11-21 ENCOUNTER — Ambulatory Visit: Payer: Medicare Other | Attending: Physical Therapy | Admitting: Physical Therapy

## 2013-11-21 DIAGNOSIS — Z9889 Other specified postprocedural states: Secondary | ICD-10-CM | POA: Insufficient documentation

## 2013-11-21 DIAGNOSIS — E119 Type 2 diabetes mellitus without complications: Secondary | ICD-10-CM | POA: Diagnosis not present

## 2013-11-21 DIAGNOSIS — I890 Lymphedema, not elsewhere classified: Secondary | ICD-10-CM | POA: Diagnosis present

## 2013-11-21 DIAGNOSIS — C50911 Malignant neoplasm of unspecified site of right female breast: Secondary | ICD-10-CM | POA: Diagnosis present

## 2013-11-21 DIAGNOSIS — Z96653 Presence of artificial knee joint, bilateral: Secondary | ICD-10-CM | POA: Diagnosis not present

## 2013-11-25 ENCOUNTER — Telehealth: Payer: Self-pay | Admitting: *Deleted

## 2013-11-25 NOTE — Telephone Encounter (Signed)
Pt called requesting information on where to get post surgical bra. Gave pt address and telephone number to [employer].  Pt denies further needs at this time. Encourage pt to call with questions. Received verbal understanding.

## 2013-12-01 ENCOUNTER — Encounter: Payer: Self-pay | Admitting: *Deleted

## 2014-01-06 ENCOUNTER — Emergency Department (HOSPITAL_COMMUNITY): Payer: Medicare Other

## 2014-01-06 ENCOUNTER — Encounter (HOSPITAL_COMMUNITY): Payer: Self-pay | Admitting: Emergency Medicine

## 2014-01-06 ENCOUNTER — Observation Stay (HOSPITAL_COMMUNITY)
Admission: EM | Admit: 2014-01-06 | Discharge: 2014-01-07 | Disposition: A | Discharge status: Home / Self Care | Payer: Medicare Other | Attending: Internal Medicine | Admitting: Internal Medicine

## 2014-01-06 DIAGNOSIS — K219 Gastro-esophageal reflux disease without esophagitis: Secondary | ICD-10-CM | POA: Diagnosis not present

## 2014-01-06 DIAGNOSIS — F329 Major depressive disorder, single episode, unspecified: Secondary | ICD-10-CM | POA: Insufficient documentation

## 2014-01-06 DIAGNOSIS — E039 Hypothyroidism, unspecified: Secondary | ICD-10-CM | POA: Diagnosis not present

## 2014-01-06 DIAGNOSIS — Z79899 Other long term (current) drug therapy: Secondary | ICD-10-CM | POA: Diagnosis not present

## 2014-01-06 DIAGNOSIS — G43909 Migraine, unspecified, not intractable, without status migrainosus: Secondary | ICD-10-CM | POA: Insufficient documentation

## 2014-01-06 DIAGNOSIS — N63 Unspecified lump in breast: Secondary | ICD-10-CM | POA: Insufficient documentation

## 2014-01-06 DIAGNOSIS — E1142 Type 2 diabetes mellitus with diabetic polyneuropathy: Secondary | ICD-10-CM | POA: Insufficient documentation

## 2014-01-06 DIAGNOSIS — G219 Secondary parkinsonism, unspecified: Secondary | ICD-10-CM | POA: Insufficient documentation

## 2014-01-06 DIAGNOSIS — M1990 Unspecified osteoarthritis, unspecified site: Secondary | ICD-10-CM | POA: Diagnosis not present

## 2014-01-06 DIAGNOSIS — F209 Schizophrenia, unspecified: Secondary | ICD-10-CM | POA: Diagnosis not present

## 2014-01-06 DIAGNOSIS — N189 Chronic kidney disease, unspecified: Secondary | ICD-10-CM | POA: Diagnosis not present

## 2014-01-06 DIAGNOSIS — Z853 Personal history of malignant neoplasm of breast: Secondary | ICD-10-CM | POA: Insufficient documentation

## 2014-01-06 DIAGNOSIS — Z794 Long term (current) use of insulin: Secondary | ICD-10-CM | POA: Diagnosis not present

## 2014-01-06 DIAGNOSIS — E669 Obesity, unspecified: Secondary | ICD-10-CM | POA: Diagnosis not present

## 2014-01-06 DIAGNOSIS — Z87442 Personal history of urinary calculi: Secondary | ICD-10-CM | POA: Insufficient documentation

## 2014-01-06 DIAGNOSIS — I129 Hypertensive chronic kidney disease with stage 1 through stage 4 chronic kidney disease, or unspecified chronic kidney disease: Secondary | ICD-10-CM | POA: Insufficient documentation

## 2014-01-06 DIAGNOSIS — R0602 Shortness of breath: Secondary | ICD-10-CM

## 2014-01-06 DIAGNOSIS — J45909 Unspecified asthma, uncomplicated: Secondary | ICD-10-CM | POA: Insufficient documentation

## 2014-01-06 DIAGNOSIS — E785 Hyperlipidemia, unspecified: Secondary | ICD-10-CM | POA: Diagnosis not present

## 2014-01-06 DIAGNOSIS — G4733 Obstructive sleep apnea (adult) (pediatric): Secondary | ICD-10-CM | POA: Diagnosis not present

## 2014-01-06 DIAGNOSIS — Z7982 Long term (current) use of aspirin: Secondary | ICD-10-CM | POA: Insufficient documentation

## 2014-01-06 DIAGNOSIS — H8110 Benign paroxysmal vertigo, unspecified ear: Secondary | ICD-10-CM | POA: Diagnosis not present

## 2014-01-06 DIAGNOSIS — Z8701 Personal history of pneumonia (recurrent): Secondary | ICD-10-CM | POA: Diagnosis not present

## 2014-01-06 DIAGNOSIS — G8929 Other chronic pain: Secondary | ICD-10-CM | POA: Insufficient documentation

## 2014-01-06 DIAGNOSIS — R079 Chest pain, unspecified: Secondary | ICD-10-CM | POA: Diagnosis not present

## 2014-01-06 DIAGNOSIS — Z87891 Personal history of nicotine dependence: Secondary | ICD-10-CM | POA: Insufficient documentation

## 2014-01-06 DIAGNOSIS — E079 Disorder of thyroid, unspecified: Secondary | ICD-10-CM | POA: Insufficient documentation

## 2014-01-06 DIAGNOSIS — D649 Anemia, unspecified: Secondary | ICD-10-CM | POA: Diagnosis not present

## 2014-01-06 DIAGNOSIS — F419 Anxiety disorder, unspecified: Secondary | ICD-10-CM | POA: Insufficient documentation

## 2014-01-06 DIAGNOSIS — Z923 Personal history of irradiation: Secondary | ICD-10-CM | POA: Insufficient documentation

## 2014-01-06 DIAGNOSIS — Z8619 Personal history of other infectious and parasitic diseases: Secondary | ICD-10-CM | POA: Diagnosis not present

## 2014-01-06 DIAGNOSIS — C50411 Malignant neoplasm of upper-outer quadrant of right female breast: Secondary | ICD-10-CM | POA: Diagnosis present

## 2014-01-06 DIAGNOSIS — J4530 Mild persistent asthma, uncomplicated: Secondary | ICD-10-CM | POA: Diagnosis present

## 2014-01-06 DIAGNOSIS — M549 Dorsalgia, unspecified: Secondary | ICD-10-CM

## 2014-01-06 HISTORY — DX: Other chronic pain: G89.29

## 2014-01-06 HISTORY — DX: Obstructive sleep apnea (adult) (pediatric): G47.33

## 2014-01-06 HISTORY — DX: Pneumonia, unspecified organism: J18.9

## 2014-01-06 HISTORY — DX: Personal history of other diseases of the digestive system: Z87.19

## 2014-01-06 HISTORY — DX: Headache, unspecified: R51.9

## 2014-01-06 HISTORY — DX: Headache: R51

## 2014-01-06 HISTORY — DX: Type 2 diabetes mellitus without complications: E11.9

## 2014-01-06 HISTORY — DX: Migraine, unspecified, not intractable, without status migrainosus: G43.909

## 2014-01-06 HISTORY — DX: Low back pain: M54.5

## 2014-01-06 HISTORY — DX: Personal history of peptic ulcer disease: Z87.11

## 2014-01-06 HISTORY — DX: Schizophrenia, unspecified: F20.9

## 2014-01-06 HISTORY — DX: Low back pain, unspecified: M54.50

## 2014-01-06 HISTORY — DX: Dependence on other enabling machines and devices: Z99.89

## 2014-01-06 LAB — COMPREHENSIVE METABOLIC PANEL
ALT: 11 U/L (ref 0–35)
AST: 13 U/L (ref 0–37)
Albumin: 3.6 g/dL (ref 3.5–5.2)
Alkaline Phosphatase: 85 U/L (ref 39–117)
Anion gap: 16 — ABNORMAL HIGH (ref 5–15)
BUN: 28 mg/dL — ABNORMAL HIGH (ref 6–23)
CALCIUM: 10.2 mg/dL (ref 8.4–10.5)
CO2: 22 meq/L (ref 19–32)
CREATININE: 1.32 mg/dL — AB (ref 0.50–1.10)
Chloride: 98 mEq/L (ref 96–112)
GFR, EST AFRICAN AMERICAN: 51 mL/min — AB (ref >90)
GFR, EST NON AFRICAN AMERICAN: 44 mL/min — AB (ref >90)
Glucose, Bld: 188 mg/dL — ABNORMAL HIGH (ref 70–99)
Potassium: 4.5 mEq/L (ref 3.7–5.3)
Sodium: 136 mEq/L — ABNORMAL LOW (ref 137–147)
Total Bilirubin: <0.2 mg/dL — ABNORMAL LOW (ref 0.3–1.2)
Total Protein: 7.3 g/dL (ref 6.0–8.3)

## 2014-01-06 LAB — GLUCOSE, CAPILLARY
Glucose-Capillary: 119 mg/dL — ABNORMAL HIGH (ref 70–99)
Glucose-Capillary: 129 mg/dL — ABNORMAL HIGH (ref 70–99)

## 2014-01-06 LAB — CBC WITH DIFFERENTIAL/PLATELET
Basophils Absolute: 0 10*3/uL (ref 0.0–0.1)
Basophils Relative: 0 % (ref 0–1)
EOS PCT: 9 % — AB (ref 0–5)
Eosinophils Absolute: 1 10*3/uL — ABNORMAL HIGH (ref 0.0–0.7)
HEMATOCRIT: 33.1 % — AB (ref 36.0–46.0)
Hemoglobin: 10.8 g/dL — ABNORMAL LOW (ref 12.0–15.0)
LYMPHS ABS: 2.4 10*3/uL (ref 0.7–4.0)
Lymphocytes Relative: 23 % (ref 12–46)
MCH: 27.5 pg (ref 26.0–34.0)
MCHC: 32.6 g/dL (ref 30.0–36.0)
MCV: 84.2 fL (ref 78.0–100.0)
MONO ABS: 0.4 10*3/uL (ref 0.1–1.0)
Monocytes Relative: 4 % (ref 3–12)
Neutro Abs: 6.6 10*3/uL (ref 1.7–7.7)
Neutrophils Relative %: 64 % (ref 43–77)
Platelets: 279 10*3/uL (ref 150–400)
RBC: 3.93 MIL/uL (ref 3.87–5.11)
RDW: 15.1 % (ref 11.5–15.5)
WBC: 10.4 10*3/uL (ref 4.0–10.5)

## 2014-01-06 LAB — I-STAT CG4 LACTIC ACID, ED: Lactic Acid, Venous: 2.83 mmol/L — ABNORMAL HIGH (ref 0.5–2.2)

## 2014-01-06 LAB — URINALYSIS, ROUTINE W REFLEX MICROSCOPIC
Bilirubin Urine: NEGATIVE
GLUCOSE, UA: NEGATIVE mg/dL
Hgb urine dipstick: NEGATIVE
Ketones, ur: NEGATIVE mg/dL
Nitrite: NEGATIVE
PH: 5.5 (ref 5.0–8.0)
Protein, ur: NEGATIVE mg/dL
Specific Gravity, Urine: 1.018 (ref 1.005–1.030)
Urobilinogen, UA: 0.2 mg/dL (ref 0.0–1.0)

## 2014-01-06 LAB — TROPONIN I
Troponin I: <0.3 ng/mL (ref <0.30)
Troponin I: <0.3 ng/mL (ref <0.30)

## 2014-01-06 LAB — URINE MICROSCOPIC-ADD ON

## 2014-01-06 LAB — I-STAT TROPONIN, ED: TROPONIN I, POC: 0 ng/mL (ref 0.00–0.08)

## 2014-01-06 LAB — PRO B NATRIURETIC PEPTIDE: Pro B Natriuretic peptide (BNP): 14.5 pg/mL (ref 0–125)

## 2014-01-06 MED ORDER — IRBESARTAN 150 MG PO TABS
150.0000 mg | ORAL_TABLET | Freq: Every day | ORAL | Status: DC
  Administered 2014-01-06 – 2014-01-07 (×2): 150 mg via ORAL
  Filled 2014-01-06 (×2): qty 1

## 2014-01-06 MED ORDER — GABAPENTIN 600 MG PO TABS
600.0000 mg | ORAL_TABLET | Freq: Two times a day (BID) | ORAL | Status: DC
  Administered 2014-01-06 – 2014-01-07 (×2): 600 mg via ORAL
  Filled 2014-01-06 (×2): qty 1

## 2014-01-06 MED ORDER — ONDANSETRON HCL 4 MG PO TABS: 4.0000 mg | ORAL_TABLET | Freq: Four times a day (QID) | ORAL | Status: DC | PRN

## 2014-01-06 MED ORDER — ONDANSETRON HCL 4 MG/2ML IJ SOLN: 4.0000 mg | Freq: Four times a day (QID) | INTRAMUSCULAR | Status: DC | PRN

## 2014-01-06 MED ORDER — VALACYCLOVIR HCL 500 MG PO TABS
1000.0000 mg | ORAL_TABLET | Freq: Every day | ORAL | Status: DC
  Administered 2014-01-06 – 2014-01-07 (×2): 1000 mg via ORAL
  Filled 2014-01-06 (×3): qty 2

## 2014-01-06 MED ORDER — NITROGLYCERIN 0.4 MG SL SUBL
0.4000 mg | SUBLINGUAL_TABLET | SUBLINGUAL | Status: DC | PRN
  Administered 2014-01-06: 0.4 mg via SUBLINGUAL
  Filled 2014-01-06: qty 1

## 2014-01-06 MED ORDER — LEVOTHYROXINE SODIUM 100 MCG PO TABS: 200.0000 ug | ORAL_TABLET | Freq: Every day | ORAL | Status: DC

## 2014-01-06 MED ORDER — TECHNETIUM TO 99M ALBUMIN AGGREGATED
6.0000 | Freq: Once | INTRAVENOUS | Status: AC | PRN
  Administered 2014-01-06: 6 via INTRAVENOUS

## 2014-01-06 MED ORDER — VALSARTAN-HYDROCHLOROTHIAZIDE 160-12.5 MG PO TABS: 1.0000 | ORAL_TABLET | Freq: Every day | ORAL | Status: DC

## 2014-01-06 MED ORDER — GABAPENTIN ENACARBIL ER 600 MG PO TBCR: 600.0000 mg | EXTENDED_RELEASE_TABLET | Freq: Two times a day (BID) | ORAL | Status: DC

## 2014-01-06 MED ORDER — HYDROCHLOROTHIAZIDE 12.5 MG PO CAPS
12.5000 mg | ORAL_CAPSULE | Freq: Every day | ORAL | Status: DC
  Administered 2014-01-06 – 2014-01-07 (×2): 12.5 mg via ORAL
  Filled 2014-01-06 (×2): qty 1

## 2014-01-06 MED ORDER — INSULIN DETEMIR 100 UNIT/ML ~~LOC~~ SOLN
10.0000 [IU] | Freq: Every day | SUBCUTANEOUS | Status: DC
  Administered 2014-01-06: 10 [IU] via SUBCUTANEOUS
  Filled 2014-01-06 (×2): qty 0.1

## 2014-01-06 MED ORDER — PERPHENAZINE 2 MG PO TABS
2.0000 mg | ORAL_TABLET | Freq: Every day | ORAL | Status: DC
  Administered 2014-01-06: 2 mg via ORAL
  Filled 2014-01-06 (×2): qty 1

## 2014-01-06 MED ORDER — TRAMADOL HCL 50 MG PO TABS: 50.0000 mg | ORAL_TABLET | Freq: Four times a day (QID) | ORAL | Status: DC | PRN

## 2014-01-06 MED ORDER — ASPIRIN EC 81 MG PO TBEC
81.0000 mg | DELAYED_RELEASE_TABLET | Freq: Every day | ORAL | Status: DC
  Administered 2014-01-06 – 2014-01-07 (×2): 81 mg via ORAL
  Filled 2014-01-06 (×2): qty 1

## 2014-01-06 MED ORDER — OXYCODONE-ACETAMINOPHEN 5-325 MG PO TABS
1.0000 | ORAL_TABLET | Freq: Four times a day (QID) | ORAL | Status: DC | PRN
  Administered 2014-01-06 – 2014-01-07 (×3): 1 via ORAL
  Filled 2014-01-06 (×3): qty 1

## 2014-01-06 MED ORDER — ARIPIPRAZOLE 10 MG PO TABS
10.0000 mg | ORAL_TABLET | Freq: Every day | ORAL | Status: DC
  Administered 2014-01-06 – 2014-01-07 (×2): 10 mg via ORAL
  Filled 2014-01-06 (×4): qty 1

## 2014-01-06 MED ORDER — LEVOTHYROXINE SODIUM 25 MCG PO TABS: 25.0000 ug | ORAL_TABLET | Freq: Every day | ORAL | Status: DC

## 2014-01-06 MED ORDER — ACETAMINOPHEN 325 MG PO TABS: 650.0000 mg | ORAL_TABLET | Freq: Four times a day (QID) | ORAL | Status: DC | PRN

## 2014-01-06 MED ORDER — CALCIUM CARBONATE ANTACID 500 MG PO CHEW
1.0000 | CHEWABLE_TABLET | Freq: Four times a day (QID) | ORAL | Status: DC | PRN
  Administered 2014-01-06: 200 mg via ORAL
  Filled 2014-01-06: qty 1

## 2014-01-06 MED ORDER — PAROXETINE HCL 20 MG PO TABS
20.0000 mg | ORAL_TABLET | Freq: Every day | ORAL | Status: DC
  Administered 2014-01-06 – 2014-01-07 (×2): 20 mg via ORAL
  Filled 2014-01-06 (×2): qty 1

## 2014-01-06 MED ORDER — FENTANYL CITRATE 0.05 MG/ML IJ SOLN
100.0000 ug | Freq: Once | INTRAMUSCULAR | Status: AC
  Administered 2014-01-06: 100 ug via INTRAVENOUS
  Filled 2014-01-06: qty 2

## 2014-01-06 MED ORDER — TRAZODONE HCL 100 MG PO TABS
100.0000 mg | ORAL_TABLET | Freq: Every day | ORAL | Status: DC
  Administered 2014-01-06: 100 mg via ORAL
  Filled 2014-01-06: qty 1

## 2014-01-06 MED ORDER — PANTOPRAZOLE SODIUM 40 MG PO TBEC
40.0000 mg | DELAYED_RELEASE_TABLET | Freq: Every day | ORAL | Status: DC
  Administered 2014-01-06 – 2014-01-07 (×2): 40 mg via ORAL
  Filled 2014-01-06 (×2): qty 1

## 2014-01-06 MED ORDER — TECHNETIUM TC 99M DIETHYLENETRIAME-PENTAACETIC ACID: 35.0000 | Freq: Once | INTRAVENOUS | Status: DC | PRN

## 2014-01-06 MED ORDER — ENOXAPARIN SODIUM 40 MG/0.4ML ~~LOC~~ SOLN
40.0000 mg | SUBCUTANEOUS | Status: DC
  Administered 2014-01-06: 40 mg via SUBCUTANEOUS
  Filled 2014-01-06: qty 0.4

## 2014-01-06 MED ORDER — IPRATROPIUM-ALBUTEROL 0.5-2.5 (3) MG/3ML IN SOLN
3.0000 mL | Freq: Once | RESPIRATORY_TRACT | Status: AC
  Administered 2014-01-06: 3 mL via RESPIRATORY_TRACT
  Filled 2014-01-06: qty 3

## 2014-01-06 MED ORDER — ANASTROZOLE 1 MG PO TABS
1.0000 mg | ORAL_TABLET | Freq: Every day | ORAL | Status: DC
  Administered 2014-01-06: 1 mg via ORAL
  Filled 2014-01-06 (×2): qty 1

## 2014-01-06 MED ORDER — ROSUVASTATIN CALCIUM 10 MG PO TABS
20.0000 mg | ORAL_TABLET | Freq: Every day | ORAL | Status: DC
  Administered 2014-01-06: 20 mg via ORAL
  Filled 2014-01-06: qty 2

## 2014-01-06 MED ORDER — ACETAMINOPHEN 650 MG RE SUPP: 650.0000 mg | Freq: Four times a day (QID) | RECTAL | Status: DC | PRN

## 2014-01-06 MED ORDER — INSULIN DETEMIR 100 UNIT/ML FLEXPEN
10.0000 [IU] | PEN_INJECTOR | Freq: Every day | SUBCUTANEOUS | Status: DC
Start: 2014-01-06 — End: 2014-01-06

## 2014-01-06 MED ORDER — LEVOTHYROXINE SODIUM 75 MCG PO TABS
225.0000 ug | ORAL_TABLET | Freq: Every day | ORAL | Status: DC
  Administered 2014-01-07: 225 ug via ORAL
  Filled 2014-01-06: qty 3

## 2014-01-06 NOTE — ED Notes (Signed)
Hospitalist at bedside 

## 2014-01-06 NOTE — Plan of Care (Signed)
Problem: Consults Goal: Chest Pain Patient Education (See Patient Education module for education specifics.) Outcome: Completed/Met Date Met:  01/06/14 Goal: Skin Care Protocol Initiated - if Braden Score 18 or less If consults are not indicated, leave blank or document N/A Outcome: Completed/Met Date Met:  01/06/14 Goal: Tobacco Cessation referral if indicated Outcome: Not Applicable Date Met:  93/40/68 Goal: Nutrition Consult-if indicated Outcome: Not Applicable Date Met:  40/33/53 Goal: Diabetes Guidelines if Diabetic/Glucose > 140 If diabetic or lab glucose is > 140 mg/dl - Initiate Diabetes/Hyperglycemia Guidelines & Document Interventions  Outcome: Completed/Met Date Met:  01/06/14  Problem: Phase I Progression Outcomes Goal: Hemodynamically stable Outcome: Completed/Met Date Met:  01/06/14 Goal: Aspirin unless contraindicated Outcome: Completed/Met Date Met:  01/06/14 Goal: MD aware of Cardiac Marker results Outcome: Completed/Met Date Met:  01/06/14 Goal: Voiding-avoid urinary catheter unless indicated Outcome: Completed/Met Date Met:  01/06/14 Goal: Other Phase I Outcomes/Goals Outcome: Completed/Met Date Met:  01/06/14

## 2014-01-06 NOTE — ED Notes (Signed)
IV team attempted larger access for CT scan x2. Unable to obtain access for CT. Notified [place], [place]; notified V/Q scan to proceed.

## 2014-01-06 NOTE — ED Provider Notes (Signed)
CSN: [id number]     Arrival date & time 01/06/14  0944 History   First MD Initiated Contact with Patient 01/06/14 [phone]     Chief Complaint  Patient presents with  . Shortness of Breath  . Chest Pain     (Consider location/radiation/quality/duration/timing/severity/associated sxs/prior Treatment) HPI Comments: Patient is a 58 year old female with history of depression, thyroid disease, GERD, pancreatitis, obesity, herpes simplex virus, hyperlipidemia, asthma, hypertension, sleep apnea, breast cancer, diabetes, hypothyroidism, vertigo who presents the emergency department for evaluation of sudden onset chest pain and shortness of breath. She reports that she was at her pain management appointment when she developed the symptoms. She describes the chest pain as feeling as though someone is sitting on her chest. She denies any fevers, chills, cough, leg swelling, long trips or recent surgeries. This feels similar to the time when she was admitted to the hospital in June.  Patient is a 58 y.o. female presenting with shortness of breath and chest pain. The history is provided by the patient. No language interpreter was used.  Shortness of Breath Associated symptoms: chest pain   Associated symptoms: no abdominal pain, no fever and no vomiting   Chest Pain Associated symptoms: shortness of breath   Associated symptoms: no abdominal pain, no fever, no nausea and not vomiting     Past Medical History  Diagnosis Date  . Depression   . Thyroid disease   . GERD (gastroesophageal reflux disease)   . Pancreatitis     hx of  . Knee pain, bilateral   . Obesity   . HSV (herpes simplex virus) infection   . Hyperlipemia   . Asthma   . Tachycardia     with sx in 2008  . Breast mass in female     L breast 2008, US showed likely fat necrosis  . Arthritis   . Anxiety   . Heartburn   . Hypertension     sees Dr. Criss Rosales , [facility]  . Hx of radiation therapy 05/05/12- 07/15/12    right breast, 45  gray x 25 fx, lumpectomy cavity boosted to 16.2 gray  . Sleep apnea     uses cpap, pt does not know settings  . History of kidney stones   . Headache(784.0)   . 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  . Anemia   . History of blood transfusion 2011  . Hypothyroidism   . Diabetes mellitus   . 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 Polyneuropathy in diabetes(357.2)   . Benign paroxysmal positional vertigo 12/30/2012   Past Surgical History  Procedure Laterality Date  . Replacement total knee bilateral Bilateral yrs ago  . Foot surgery Right 10 yrs ago  . Abdominal surgery    . Thyroid surgery  yrs ago  . Joint replacement    . Abdominal hysterectomy  yrs ago    partial  . Breast surgery Right   . Breast lumpectomy  03/03/2012    Procedure: LUMPECTOMY;  Surgeon: Haywood Lasso, MD;  Location: [facility];  Service: General;  Laterality: Right;  . Cholecystectomy  yrs ago  . Colonoscopy with propofol N/A 09/28/2012    Procedure: COLONOSCOPY WITH PROPOFOL;  Surgeon: Lear Ng, MD;  Location: [facility];  Service: Endoscopy;  Laterality: N/A;   Family History  Problem Relation Age of Onset  . Heart disease Brother     Multiple MIs, starting in his 15s  . Diabetes  Brother   . Hypertension Mother   . Diabetes Mother   . Breast cancer Mother 78  . Bone cancer Mother   . Hypertension Sister   . Diabetes Sister   . Breast cancer Sister 17  . Breast cancer Maternal Grandmother   . Heart disease Maternal Grandmother   . Uterine cancer Other 19  . Breast cancer Paternal Aunt 29  . Breast cancer Paternal Grandmother     dx in her 77s  . Prostate cancer Paternal Grandfather    History  Substance Use Topics  . Smoking status: Former Smoker -- 1.00 packs/day for 7 years    Types: Cigarettes    Quit date: 03/24/1992  . Smokeless tobacco: Never Used  . Alcohol Use: No   OB History    No data available     Review of  Systems  Constitutional: Negative for fever and chills.  Respiratory: Positive for shortness of breath.   Cardiovascular: Positive for chest pain.  Gastrointestinal: Negative for nausea, vomiting and abdominal pain.  All other systems reviewed and are negative.     Allergies  Shellfish allergy; Bee venom; Morphine and related; and Oxycodone hcl  Home Medications   Prior to Admission medications   Medication Sig Start Date End Date Taking? Authorizing Provider  anastrozole (ARIMIDEX) 1 MG tablet TAKE ONE TABLET BY MOUTH ONCE DAILY    Chauncey Cruel, MD  ARIPiprazole (ABILIFY) 10 MG tablet Take 10 mg by mouth daily.     Historical Provider, MD  aspirin EC 81 MG tablet Take 1 tablet (81 mg total) by mouth daily. 08/12/13   Sheila Oats, MD  CALCIUM PO Take 1 tablet by mouth daily.     Historical Provider, MD  CETIRIZINE HCL PO Take by mouth as directed.    Historical Provider, MD  cholecalciferol (VITAMIN D) 1000 UNITS tablet Take 1 tablet (1,000 Units total) by mouth daily. 11/18/12   Chauncey Cruel, MD  desonide (DESOWEN) 0.05 % ointment Apply 1 application topically 2 (two) times daily.    Historical Provider, MD  diclofenac sodium (VOLTAREN) 1 % GEL Apply 2 g topically 4 (four) times daily.    Historical Provider, MD  esomeprazole (NEXIUM) 40 MG capsule Take 40 mg by mouth daily before breakfast.    Historical Provider, MD  gabapentin (NEURONTIN) 100 MG capsule Take 100 mg by mouth as directed. takes 155m at breakfast, 1043mat lunch and 30071mt bedtime 12/20/12   Historical Provider, MD  insulin aspart (NOVOLOG) 100 UNIT/ML injection Inject 2 Units into the skin 3 (three) times daily before meals. Sliding scale    Historical Provider, MD  Insulin Detemir (LEVEMIR FLEXPEN) 100 UNIT/ML Pen Inject 15 Units into the skin at bedtime.    Historical Provider, MD  levothyroxine (SYNTHROID, LEVOTHROID) 200 MCG tablet Take 200 mcg by mouth daily before breakfast.     Historical  Provider, MD  levothyroxine (SYNTHROID, LEVOTHROID) 25 MCG tablet Take 25 mcg by mouth daily before breakfast.    Historical Provider, MD  lisinopril (PRINIVIL,ZESTRIL) 2.5 MG tablet Take 2.5 mg by mouth daily. 12/20/12   Historical Provider, MD  meloxicam (MOBIC) 15 MG tablet Take 15 mg by mouth daily.    Historical Provider, MD  Multiple Vitamin (MULTIVITAMIN WITH MINERALS) TABS Take 1 tablet by mouth daily.    Historical Provider, MD  Naproxen Sodium (ALEVE PO) Take by mouth. As needed for pain    Historical Provider, MD  NON FORMULARY Insulin test strips  Historical Provider, MD  PARoxetine (PAXIL) 20 MG tablet Take 20 mg by mouth daily.     Historical Provider, MD  perphenazine (TRILAFON) 2 MG tablet Take 2 mg by mouth at bedtime.    Historical Provider, MD  rosuvastatin (CRESTOR) 20 MG tablet Take 20 mg by mouth daily.    Historical Provider, MD  sitaGLIPtin-metformin (JANUMET) 50-500 MG per tablet Take 1 tablet by mouth 2 (two) times daily with a meal.    Historical Provider, MD  traMADol (ULTRAM) 50 MG tablet Take 50 mg by mouth every 6 (six) hours as needed for moderate pain.  05/30/13   Historical Provider, MD  traZODone (DESYREL) 100 MG tablet Take 100 mg by mouth at bedtime.    Historical Provider, MD  valACYclovir (VALTREX) 1000 MG tablet Take 1,000 mg by mouth daily.    Historical Provider, MD  valsartan-hydrochlorothiazide (DIOVAN-HCT) 160-12.5 MG per tablet Take 1 tablet by mouth daily.  02/08/13   Historical Provider, MD   BP 115/56 mmHg  Pulse 112  Resp 20  Ht _0  (1.651 m)  Wt 291 lb (131.997 kg)  BMI 48.43 kg/m2  SpO2 97% Physical Exam  Constitutional: She is oriented to person, place, and time. She appears well-developed and well-nourished. No distress.  HENT:  Head: Normocephalic and atraumatic.  Right Ear: External ear normal.  Left Ear: External ear normal.  Nose: Nose normal.  Mouth/Throat: Oropharynx is clear and moist.  Eyes: Conjunctivae are normal.   Neck: Normal range of motion.  Cardiovascular: Normal rate, regular rhythm, normal heart sounds, intact distal pulses and normal pulses.   Pulses:      Radial pulses are 2+ on the right side, and 2+ on the left side.       Posterior tibial pulses are 2+ on the right side, and 2+ on the left side.  No leg swelling or tenderness  Pulmonary/Chest: Effort normal. No stridor. No respiratory distress. She has wheezes (mild). She has no rales.  Abdominal: Soft. She exhibits no distension.  Musculoskeletal: Normal range of motion.  Neurological: She is alert and oriented to person, place, and time. She has normal strength.  Skin: Skin is warm and dry. She is not diaphoretic. No erythema.  Psychiatric: She has a normal mood and affect. Her behavior is normal.  Nursing note and vitals reviewed.   ED Course  Procedures (including critical care time) Labs Review Labs Reviewed  CBC WITH DIFFERENTIAL - Abnormal; Notable for the following:    Hemoglobin 10.8 (*)    HCT 33.1 (*)    Eosinophils Relative 9 (*)    Eosinophils Absolute 1.0 (*)    All other components within normal limits  COMPREHENSIVE METABOLIC PANEL - Abnormal; Notable for the following:    Sodium 136 (*)    Glucose, Bld 188 (*)    BUN 28 (*)    Creatinine, Ser 1.32 (*)    Total Bilirubin <0.2 (*)    GFR calc non Af Amer 44 (*)    GFR calc Af Amer 51 (*)    Anion gap 16 (*)    All other components within normal limits  URINALYSIS, ROUTINE W REFLEX MICROSCOPIC - Abnormal; Notable for the following:    APPearance HAZY (*)    Leukocytes, UA SMALL (*)    All other components within normal limits  URINE MICROSCOPIC-ADD ON - Abnormal; Notable for the following:    Squamous Epithelial / LPF MANY (*)    Casts HYALINE CASTS (*)  All other components within normal limits  GLUCOSE, CAPILLARY - Abnormal; Notable for the following:    Glucose-Capillary 119 (*)    All other components within normal limits  I-STAT CG4 LACTIC ACID,  ED - Abnormal; Notable for the following:    Lactic Acid, Venous 2.83 (*)    All other components within normal limits  TROPONIN I  PRO B NATRIURETIC PEPTIDE  TROPONIN I  TROPONIN I  TROPONIN I  COMPREHENSIVE METABOLIC PANEL  CBC  I-STAT TROPOININ, ED    Imaging Review Nm Pulmonary Perf And Vent  01/06/2014   CLINICAL DATA:  Chest pain and shortness of breath today.  EXAM: NUCLEAR MEDICINE VENTILATION - PERFUSION LUNG SCAN  TECHNIQUE: Ventilation images were obtained in multiple projections using inhaled aerosol technetium 99 M DTPA. Perfusion images were obtained in multiple projections after intravenous injection of Tc-64mMAA.  RADIOPHARMACEUTICALS:  35 mCi Tc-954mTPA aerosol and 5 mCi Tc-992mA  COMPARISON:  Chest x-ray dated 01/06/2014 and lung scan dated 08/08/2013  FINDINGS: Ventilation: No focal ventilation defect.  Perfusion: No wedge shaped peripheral perfusion defects to suggest acute pulmonary embolism.  IMPRESSION: Normal ventilation perfusion lung scan. No evidence of recent pulmonary embolism.   Electronically Signed   By: JimRozetta NunneryD.   On: 01/06/2014 14:35   Dg Chest Portable 1 View  01/06/2014   CLINICAL DATA:  Chest pain and shortness of breath.  EXAM: PORTABLE CHEST - 1 VIEW  COMPARISON:  11/13/2013 and 08/10/2013  FINDINGS: Heart size and vascularity are normal and the lungs are clear. Surgical clips in the right peritracheal region consistent with previous thyroid surgery.  No osseous abnormality.  IMPRESSION: No significant abnormalities.   Electronically Signed   By: JimRozetta NunneryD.   On: 01/06/2014 10:28     EKG Interpretation   Date/Time:  Friday January 06 2014 09:52:39 EST Ventricular Rate:  112 PR Interval:  157 QRS Duration: 76 QT Interval:  319 QTC Calculation: 435 R Axis:   -19 Text Interpretation:  Sinus tachycardia Borderline left axis deviation  Abnormal R-wave progression, early transition Abnormal inferior Q waves  Borderline T  abnormalities, anterior leads No significant change was found  Confirmed by RANWyvonnia DuskyD, STEPHEN ([id number] 01/06/2014 10:02:50 AM      MDM   Final diagnoses:  SOB (shortness of breath)  Chest pain  Shortness of breath    Presents emergency department for evaluation of chest pain and shortness of breath. Negative VQ scan. Negative initial troponin and EKG unchanged. Will admit to medicine for ACS rule out. Patient was evaluated by Dr. RanWyvonnia Duskyo agrees with plan. Vital signs stable at this time. Patient / Family / Caregiver informed of clinical course, understand medical decision-making process, and agree with plan.    HanElwyn LadeA-C 01/06/14 1734  SteEzequiel EssexD 01/06/14 [phone]

## 2014-01-06 NOTE — ED Notes (Signed)
Patient returned to room from Nuclear med.

## 2014-01-06 NOTE — ED Notes (Signed)
Pt got up this morning and went to pain clinic. While there, pt had episode SOB and Chest pressure. PA at clinic reported rales and wheezing. EMS reports clear lungs. Pt did not take any of her inhalers this morning. Pt has chronic back pain- no meds taken today. EKG unremarkable. BP 118/70, HR 128. Pt received 322m ASA. No nitro.

## 2014-01-06 NOTE — H&P (Addendum)
[facility] History and Physical  SYLVAN LAHM MGQ:[id number] DOB: [birth year] DOA: 01/06/2014  Referring physician: EDP PCP: [name], MD   Chief Complaint: chest pressure  HPI: [name] is a 58 y.o. female with PMH of DM, HTN, former smoker, h/o breast CA, GERD, chronic back pain presents to the ER with the above complaints. She was at her pain management clinic today when she started experiencing severe chest pressure located in midsternal region, associated with shortness of breath, this lasted for about 2hours and resolved after getting pain meds in the ER. She denies any cough, congestion, wheezing, fevers or chills. In ER, CXR unremarkable, EKG non specific, CXR and VQ scan normal.  Review of Systems: positives bolded Constitutional:  No weight loss, night sweats, Fevers, chills, fatigue.  HEENT:  No headaches, Difficulty swallowing,Tooth/dental problems,Sore throat,  No sneezing, itching, ear ache, nasal congestion, post nasal drip,  Cardio-vascular:  No chest pain, Orthopnea, PND, swelling in lower extremities, anasarca, dizziness, palpitations  GI:  No heartburn, indigestion, abdominal pain, nausea, vomiting, diarrhea, change in bowel habits, loss of appetite  Resp:  No shortness of breath with exertion or at rest. No excess mucus, no productive cough, No non-productive cough, No coughing up of blood.No change in color of mucus.No wheezing.No chest wall deformity  Skin:  no rash or lesions.  GU:  no dysuria, change in color of urine, no urgency or frequency. No flank pain.  Musculoskeletal:  No joint pain or swelling. No decreased range of motion. No back pain.  Psych:  No change in mood or affect. No depression or anxiety. No memory loss.   Past Medical History  Diagnosis Date  . Depression   . Thyroid disease   . GERD (gastroesophageal reflux disease)   . Pancreatitis     hx of  . Knee pain, bilateral   . Obesity   . HSV (herpes  simplex virus) infection   . Hyperlipemia   . Asthma   . Tachycardia     with sx in 2008  . Breast mass in female     L breast 2008, US showed likely fat necrosis  . Arthritis   . Anxiety   . Heartburn   . Hypertension     sees Dr. Criss Rosales , [facility]   . Hx of radiation therapy 05/05/12- 07/15/12    right breast, 45 gray x 25 fx, lumpectomy cavity boosted to 16.2 gray  . Sleep apnea     uses cpap, pt does not know settings  . History of kidney stones   . Headache(784.0)   . 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  . Anemia   . History of blood transfusion 2011  . Hypothyroidism   . Diabetes mellitus   . 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 Polyneuropathy in diabetes(357.2)   . Benign paroxysmal positional vertigo 12/30/2012   Past Surgical History  Procedure Laterality Date  . Replacement total knee bilateral Bilateral yrs ago  . Foot surgery Right 10 yrs ago  . Abdominal surgery    . Thyroid surgery  yrs ago  . Joint replacement    . Abdominal hysterectomy  yrs ago    partial  . Breast surgery Right   . Breast lumpectomy  03/03/2012    Procedure: LUMPECTOMY;  Surgeon: Haywood Lasso, MD;  Location: [facility];  Service: General;  Laterality: Right;  . Cholecystectomy  yrs ago  . Colonoscopy with propofol  N/A 09/28/2012    Procedure: COLONOSCOPY WITH PROPOFOL;  Surgeon: Lear Ng, MD;  Location: [facility];  Service: Endoscopy;  Laterality: N/A;   Social History:  reports that she quit smoking about 21 years ago. Her smoking use included Cigarettes. She has a 7 pack-year smoking history. She has never used smokeless tobacco. She reports that she does not drink alcohol or use illicit drugs.  Allergies  Allergen Reactions  . Shellfish Allergy Shortness Of Breath  . Bee Venom Hives  . Morphine And Related Other (See Comments)    Overly sedated  . Oxycodone Hcl Hives    Family History  Problem Relation Age  of Onset  . Heart disease Brother     Multiple MIs, starting in his 61s  . Diabetes Brother   . Hypertension Mother   . Diabetes Mother   . Breast cancer Mother 36  . Bone cancer Mother   . Hypertension Sister   . Diabetes Sister   . Breast cancer Sister 28  . Breast cancer Maternal Grandmother   . Heart disease Maternal Grandmother   . Uterine cancer Other 19  . Breast cancer Paternal Aunt 52  . Breast cancer Paternal Grandmother     dx in her 28s  . Prostate cancer Paternal Grandfather      Prior to Admission medications   Medication Sig Start Date End Date Taking? Authorizing Provider  anastrozole (ARIMIDEX) 1 MG tablet TAKE ONE TABLET BY MOUTH ONCE DAILY   Yes Chauncey Cruel, MD  ARIPiprazole (ABILIFY) 10 MG tablet Take 10 mg by mouth daily.    Yes Historical Provider, MD  aspirin EC 81 MG tablet Take 1 tablet (81 mg total) by mouth daily. 08/12/13  Yes Adeline Saralyn Pilar, MD  CALCIUM PO Take 1 tablet by mouth daily.    Yes Historical Provider, MD  CETIRIZINE HCL PO Take 1 tablet by mouth daily.    Yes Historical Provider, MD  cholecalciferol (VITAMIN D) 1000 UNITS tablet Take 1 tablet (1,000 Units total) by mouth daily. 11/18/12  Yes Chauncey Cruel, MD  diclofenac sodium (VOLTAREN) 1 % GEL Apply 2 g topically 2 (two) times daily.    Yes Historical Provider, MD  esomeprazole (NEXIUM) 40 MG capsule Take 40 mg by mouth daily before breakfast.   Yes Historical Provider, MD  Gabapentin Enacarbil (HORIZANT) 600 MG TBCR Take 600 mg by mouth 2 (two) times daily.   Yes Historical Provider, MD  insulin aspart (NOVOLOG) 100 UNIT/ML injection Inject 2 Units into the skin 3 (three) times daily before meals. Sliding scale   Yes Historical Provider, MD  Insulin Detemir (LEVEMIR FLEXPEN) 100 UNIT/ML Pen Inject 15 Units into the skin at bedtime.   Yes Historical Provider, MD  levothyroxine (SYNTHROID, LEVOTHROID) 200 MCG tablet Take 200 mcg by mouth daily before breakfast.    Yes Historical  Provider, MD  levothyroxine (SYNTHROID, LEVOTHROID) 25 MCG tablet Take 25 mcg by mouth daily before breakfast.   Yes Historical Provider, MD  lisinopril (PRINIVIL,ZESTRIL) 2.5 MG tablet Take 2.5 mg by mouth daily. 12/20/12  Yes Historical Provider, MD  meloxicam (MOBIC) 15 MG tablet Take 15 mg by mouth daily.   Yes Historical Provider, MD  Multiple Vitamin (MULTIVITAMIN WITH MINERALS) TABS Take 1 tablet by mouth daily.   Yes Historical Provider, MD  PARoxetine (PAXIL) 20 MG tablet Take 20 mg by mouth daily.    Yes Historical Provider, MD  perphenazine (TRILAFON) 2 MG tablet Take 2 mg by mouth  at bedtime.   Yes Historical Provider, MD  rosuvastatin (CRESTOR) 20 MG tablet Take 20 mg by mouth daily.   Yes Historical Provider, MD  sitaGLIPtin-metformin (JANUMET) 50-500 MG per tablet Take 1 tablet by mouth 2 (two) times daily with a meal.   Yes Historical Provider, MD  traMADol (ULTRAM) 50 MG tablet Take 50 mg by mouth every 6 (six) hours as needed for moderate pain.  05/30/13  Yes Historical Provider, MD  traZODone (DESYREL) 100 MG tablet Take 100 mg by mouth at bedtime.   Yes Historical Provider, MD  valACYclovir (VALTREX) 1000 MG tablet Take 1,000 mg by mouth daily.   Yes Historical Provider, MD  valsartan-hydrochlorothiazide (DIOVAN-HCT) 160-12.5 MG per tablet Take 1 tablet by mouth daily.  02/08/13  Yes Historical Provider, MD  NON FORMULARY Insulin test strips    Historical Provider, MD   Physical Exam: Filed Vitals:   01/06/14 1200 01/06/14 1300 01/06/14 1454 01/06/14 1600  BP: 102/52 98/59 110/43 108/44  Pulse: 107 100 117 91  Temp:      TempSrc:      Resp: _0 Height:      Weight:      SpO2: 96% 96% 100% 96%    Wt Readings from Last 3 Encounters:  01/06/14 131.997 kg (291 lb)  09/02/13 136.714 kg (301 lb 6.4 oz)  08/12/13 136.669 kg (301 lb 4.8 oz)    General:  Obese female, chronically ill appearing Eyes: PERRL, normal lids, irises & conjunctiva ENT: grossly normal  lips & tongue Neck: no LAD, masses or thyromegaly Cardiovascular: RRR, no m/r/g. No LE edema. Telemetry: SR, no arrhythmias  Respiratory: CTA bilaterally, no w/r/r. Normal respiratory effort. Abdomen: soft, Nt, ND, BS present Skin: no rash or induration seen on limited exam Musculoskeletal: grossly normal tone BUE/BLE Psychiatric: grossly normal mood and affect, speech fluent and appropriate Neurologic: grossly non-focal.          Labs on Admission:  Basic Metabolic Panel:  Recent Labs Lab 01/06/14 1010  NA 136*  K 4.5  CL 98  CO2 22  GLUCOSE 188*  BUN 28*  CREATININE 1.32*  CALCIUM 10.2   Liver Function Tests:  Recent Labs Lab 01/06/14 1010  AST 13  ALT 11  ALKPHOS 85  BILITOT <0.2*  PROT 7.3  ALBUMIN 3.6   No results for input(s): LIPASE, AMYLASE in the last 168 hours. No results for input(s): AMMONIA in the last 168 hours. CBC:  Recent Labs Lab 01/06/14 1010  WBC 10.4  NEUTROABS 6.6  HGB 10.8*  HCT 33.1*  MCV 84.2  PLT 279   Cardiac Enzymes:  Recent Labs Lab 01/06/14 1010  TROPONINI <0.30    BNP (last 3 results)  Recent Labs  08/08/13 1600 01/06/14 1010  PROBNP 155.5* 14.5   CBG: No results for input(s): GLUCAP in the last 168 hours.  Radiological Exams on Admission: Nm Pulmonary Perf And Vent  01/06/2014   CLINICAL DATA:  Chest pain and shortness of breath today.  EXAM: NUCLEAR MEDICINE VENTILATION - PERFUSION LUNG SCAN  TECHNIQUE: Ventilation images were obtained in multiple projections using inhaled aerosol technetium 99 M DTPA. Perfusion images were obtained in multiple projections after intravenous injection of Tc-61mMAA.  RADIOPHARMACEUTICALS:  35 mCi Tc-982mTPA aerosol and 5 mCi Tc-9957mA  COMPARISON:  Chest x-ray dated 01/06/2014 and lung scan dated 08/08/2013  FINDINGS: Ventilation: No focal ventilation defect.  Perfusion: No wedge shaped peripheral perfusion defects to suggest acute pulmonary embolism.  IMPRESSION: Normal  ventilation perfusion lung scan. No evidence of recent pulmonary embolism.   Electronically Signed   By: Rozetta Nunnery M.D.   On: 01/06/2014 14:35   Dg Chest Portable 1 View  01/06/2014   CLINICAL DATA:  Chest pain and shortness of breath.  EXAM: PORTABLE CHEST - 1 VIEW  COMPARISON:  11/13/2013 and 08/10/2013  FINDINGS: Heart size and vascularity are normal and the lungs are clear. Surgical clips in the right peritracheal region consistent with previous thyroid surgery.  No osseous abnormality.  IMPRESSION: No significant abnormalities.   Electronically Signed   By: Rozetta Nunnery M.D.   On: 01/06/2014 10:28    EKG: Independently reviewed. NSR, non specific T wave changes  Assessment/Plan Principal Problem:   1. Chest pain -history concerning for typical chest pain, has multiple cardiac risk factors -EKG non specific, just underwent a Myoview on 08/11/13 which was a low risk study -VQ scan normal -will admit to Tele, cycle cardiac enzymes -will Keep NPo after midnight incase troponins trend up or she ends up needing a cath -continue ASA/statin  2. Hypothyroidism -continue synthroid  3.  Asthma -stable, nebs PRN  4.  DM -continue lantus at lower dose, SSI  5.  Breast cancer of upper-outer quadrant of right female breast -continue arimidex  6.  Chronic back pain -due to DJD per Pt, followed by pain clinic -continue gabapentin, percocet PRN  DVT proph: lovenox  Code Status: DNR Family Communication: none at bedside Disposition Plan: observation  Time spent: 66mn  Brookley Spitler [facility] Pager [phone]

## 2014-01-06 NOTE — ED Notes (Signed)
Patient transported to Nuclear Med

## 2014-01-07 DIAGNOSIS — K219 Gastro-esophageal reflux disease without esophagitis: Secondary | ICD-10-CM

## 2014-01-07 DIAGNOSIS — R0789 Other chest pain: Secondary | ICD-10-CM

## 2014-01-07 LAB — COMPREHENSIVE METABOLIC PANEL
ALK PHOS: 86 U/L (ref 39–117)
ALT: 10 U/L (ref 0–35)
AST: 14 U/L (ref 0–37)
Albumin: 3.7 g/dL (ref 3.5–5.2)
Anion gap: 23 — ABNORMAL HIGH (ref 5–15)
BILIRUBIN TOTAL: 0.2 mg/dL — AB (ref 0.3–1.2)
BUN: 27 mg/dL — ABNORMAL HIGH (ref 6–23)
CHLORIDE: 100 meq/L (ref 96–112)
CO2: 16 mEq/L — ABNORMAL LOW (ref 19–32)
Calcium: 10.4 mg/dL (ref 8.4–10.5)
Creatinine, Ser: 1.36 mg/dL — ABNORMAL HIGH (ref 0.50–1.10)
GFR calc non Af Amer: 42 mL/min — ABNORMAL LOW (ref >90)
GFR, EST AFRICAN AMERICAN: 49 mL/min — AB (ref >90)
GLUCOSE: 143 mg/dL — AB (ref 70–99)
POTASSIUM: 4.8 meq/L (ref 3.7–5.3)
Sodium: 139 mEq/L (ref 137–147)
TOTAL PROTEIN: 7.6 g/dL (ref 6.0–8.3)

## 2014-01-07 LAB — CBC
HCT: 37.5 % (ref 36.0–46.0)
HEMOGLOBIN: 11.7 g/dL — AB (ref 12.0–15.0)
MCH: 26.8 pg (ref 26.0–34.0)
MCHC: 31.2 g/dL (ref 30.0–36.0)
MCV: 86 fL (ref 78.0–100.0)
Platelets: 271 10*3/uL (ref 150–400)
RBC: 4.36 MIL/uL (ref 3.87–5.11)
RDW: 15.5 % (ref 11.5–15.5)
WBC: 11 10*3/uL — ABNORMAL HIGH (ref 4.0–10.5)

## 2014-01-07 LAB — TROPONIN I: Troponin I: <0.3 ng/mL (ref <0.30)

## 2014-01-07 LAB — GLUCOSE, CAPILLARY: Glucose-Capillary: 122 mg/dL — ABNORMAL HIGH (ref 70–99)

## 2014-01-07 NOTE — Plan of Care (Signed)
Problem: Discharge Progression Outcomes Goal: No anginal pain Outcome: Completed/Met Date Met:  01/07/14 Goal: Hemodynamically stable Outcome: Completed/Met Date Met:  37/16/96 Goal: Complications resolved/controlled Outcome: Completed/Met Date Met:  01/07/14 Goal: Barriers To Progression Addressed/Resolved Outcome: Completed/Met Date Met:  01/07/14 Goal: Discharge plan in place and appropriate Outcome: Completed/Met Date Met:  01/07/14 Goal: Vascular site scale level 0 - I Vascular Site Scale Level 0: No bruising/bleeding/hematoma Level I (Mild): Bruising/Ecchymosis, minimal bleeding/ooozing, palpable hematoma < 3 cm Level II (Moderate): Bleeding not affecting hemodynamic parameters, pseudoaneurysm, palpable hematoma > 3 cm Level III (Severe) Bleeding which affects hemodynamic parameters or retroperitoneal hemorrhage  Outcome: Not Applicable Date Met:  78/93/81 Goal: Tolerates diet Outcome: Completed/Met Date Met:  01/07/14 Goal: Activity appropriate for discharge plan Outcome: Completed/Met Date Met:  01/07/14 Goal: Other Discharge Outcomes/Goals Outcome: Completed/Met Date Met:  01/07/14

## 2014-01-07 NOTE — Discharge Summary (Signed)
Physician Discharge Summary  [name] HUD:[id number] DOB: 1956/01/16 DOA: 01/06/2014  PCP: [name], MD  Admit date: 01/06/2014 Discharge date: 01/07/2014  Time spent: 35 minutes  Recommendations for Outpatient Follow-up:  1. Please follow-up on patient's chest pain symptoms, she has admitted for atypical chest pain, had 3 negative troponins   Discharge Diagnoses:  Principal Problem:   Chest pain Active Problems:   Hypothyroidism   Asthma   GERD   Breast cancer of upper-outer quadrant of right female breast   Chronic back pain   Discharge Condition: Stable/improved  Diet recommendation: Heart healthy  Filed Weights   01/06/14 0954 01/06/14 1640 01/07/14 0534  Weight: 131.997 kg (291 lb) 131.09 kg (289 lb) 127.824 kg (281 lb 12.8 oz)    History of present illness:  [name] is a 58 y.o. female with PMH of DM, HTN, former smoker, h/o breast CA, GERD, chronic back pain presents to the ER with the above complaints. She was at her pain management clinic today when she started experiencing severe chest pressure located in midsternal region, associated with shortness of breath, this lasted for about 2hours and resolved after getting pain meds in the ER. She denies any cough, congestion, wheezing, fevers or chills. In ER, CXR unremarkable, EKG non specific, CXR and VQ scan normal.  Hospital Course:  Patient is a pleasant 58 year old female with a past medical history of hypertension, diabetes mellitus, gastroesophageal reflux disease, chronic back pain he was admitted to the medicine service on 01/06/2014. She presented to the emergency room with complaints of chest pain having atypical features. Initial workup included EKG which do not reveal acute ischemic changes, chest x-ray which did not show acute cardiopulmonary process, troponin which was within normal limits. She was placed in overnight observation as her troponins were cycled and remained negative 3  sets. Continuous cardiac monitoring did not show changes. Patient did not have further episodes of chest discomfort. Chest pain may have been related to musculoskeletal origin or perhaps gastroesophageal reflux disease. He did report chest pain increasing with inspiration which could argue for pleurisy. Given clinical stability she was discharged to home on 01/07/2014. She was instructed follow-up with her PCP in 1 week.   Discharge Exam: Filed Vitals:   01/07/14 0534  BP: 92/66  Pulse: 95  Temp: 98.4 F (36.9 C)  Resp: 18    General: No acute distress, currently chest pain-free Cardiovascular: Regular rate and rhythm normal S1-S2 no murmurs rubs or gallops Respiratory: Lungs are clear to auscultation bilaterally no wheezing rhonchi or rales Abdomen: Soft nontender nondistended  Discharge Instructions You were cared for by a hospitalist during your hospital stay. If you have any questions about your discharge medications or the care you received while you were in the hospital after you are discharged, you can call the unit and asked to speak with the hospitalist on call if the hospitalist that took care of you is not available. Once you are discharged, your primary care physician will handle any further medical issues. Please note that NO REFILLS for any discharge medications will be authorized once you are discharged, as it is imperative that you return to your primary care physician (or establish a relationship with a primary care physician if you do not have one) for your aftercare needs so that they can reassess your need for medications and monitor your lab values.  Discharge Instructions    Call MD for:  difficulty breathing, headache or visual disturbances  Complete by:  As directed      Call MD for:  extreme fatigue    Complete by:  As directed      Call MD for:  hives    Complete by:  As directed      Call MD for:  persistant dizziness or light-headedness    Complete by:  As  directed      Call MD for:  persistant nausea and vomiting    Complete by:  As directed      Call MD for:  redness, tenderness, or signs of infection (pain, swelling, redness, odor or green/yellow discharge around incision site)    Complete by:  As directed      Call MD for:  severe uncontrolled pain    Complete by:  As directed      Call MD for:  temperature >100.4    Complete by:  As directed      Diet - low sodium heart healthy    Complete by:  As directed      Increase activity slowly    Complete by:  As directed           Current Discharge Medication List    CONTINUE these medications which have NOT CHANGED   Details  anastrozole (ARIMIDEX) 1 MG tablet TAKE ONE TABLET BY MOUTH ONCE DAILY Qty: 90 tablet, Refills: 4   Associated Diagnoses: Malignant neoplasm of upper-outer quadrant of female breast, right    ARIPiprazole (ABILIFY) 10 MG tablet Take 10 mg by mouth daily.     aspirin EC 81 MG tablet Take 1 tablet (81 mg total) by mouth daily. Qty: 30 tablet, Refills: 0    CALCIUM PO Take 1 tablet by mouth daily.     CETIRIZINE HCL PO Take 1 tablet by mouth daily.     cholecalciferol (VITAMIN D) 1000 UNITS tablet Take 1 tablet (1,000 Units total) by mouth daily. Qty: 30 tablet, Refills: 12    diclofenac sodium (VOLTAREN) 1 % GEL Apply 2 g topically 2 (two) times daily.     esomeprazole (NEXIUM) 40 MG capsule Take 40 mg by mouth daily before breakfast.    Gabapentin Enacarbil (HORIZANT) 600 MG TBCR Take 600 mg by mouth 2 (two) times daily.    insulin aspart (NOVOLOG) 100 UNIT/ML injection Inject 2 Units into the skin 3 (three) times daily before meals. Sliding scale    Insulin Detemir (LEVEMIR FLEXPEN) 100 UNIT/ML Pen Inject 15 Units into the skin at bedtime.    !! levothyroxine (SYNTHROID, LEVOTHROID) 200 MCG tablet Take 200 mcg by mouth daily before breakfast.     !! levothyroxine (SYNTHROID, LEVOTHROID) 25 MCG tablet Take 25 mcg by mouth daily before breakfast.     lisinopril (PRINIVIL,ZESTRIL) 2.5 MG tablet Take 2.5 mg by mouth daily.    Multiple Vitamin (MULTIVITAMIN WITH MINERALS) TABS Take 1 tablet by mouth daily.    PARoxetine (PAXIL) 20 MG tablet Take 20 mg by mouth daily.     perphenazine (TRILAFON) 2 MG tablet Take 2 mg by mouth at bedtime.    rosuvastatin (CRESTOR) 20 MG tablet Take 20 mg by mouth daily.    sitaGLIPtin-metformin (JANUMET) 50-500 MG per tablet Take 1 tablet by mouth 2 (two) times daily with a meal.    traMADol (ULTRAM) 50 MG tablet Take 50 mg by mouth every 6 (six) hours as needed for moderate pain.     traZODone (DESYREL) 100 MG tablet Take 100 mg by mouth at bedtime.  valACYclovir (VALTREX) 1000 MG tablet Take 1,000 mg by mouth daily.    valsartan-hydrochlorothiazide (DIOVAN-HCT) 160-12.5 MG per tablet Take 1 tablet by mouth daily.      !! - Potential duplicate medications found. Please discuss with provider.    STOP taking these medications     meloxicam (MOBIC) 15 MG tablet      NON FORMULARY        Allergies  Allergen Reactions  . Shellfish Allergy Shortness Of Breath  . Bee Venom Hives  . Morphine And Related Other (See Comments)    Overly sedated  . Oxycodone Hcl Hives   Follow-up Information    Follow up with [name], MD In 1 week.   Specialty:  Internal Medicine   Contact information:   [address] [place] [address] [phone]        The results of significant diagnostics from this hospitalization (including imaging, microbiology, ancillary and laboratory) are listed below for reference.    Significant Diagnostic Studies: Nm Pulmonary Perf And Vent  01/06/2014   CLINICAL DATA:  Chest pain and shortness of breath today.  EXAM: NUCLEAR MEDICINE VENTILATION - PERFUSION LUNG SCAN  TECHNIQUE: Ventilation images were obtained in multiple projections using inhaled aerosol technetium 99 M DTPA. Perfusion images were obtained in multiple projections after  intravenous injection of Tc-54mMAA.  RADIOPHARMACEUTICALS:  35 mCi Tc-913mTPA aerosol and 5 mCi Tc-995mA  COMPARISON:  Chest x-ray dated 01/06/2014 and lung scan dated 08/08/2013  FINDINGS: Ventilation: No focal ventilation defect.  Perfusion: No wedge shaped peripheral perfusion defects to suggest acute pulmonary embolism.  IMPRESSION: Normal ventilation perfusion lung scan. No evidence of recent pulmonary embolism.   Electronically Signed   By: JimRozetta NunneryD.   On: 01/06/2014 14:35   Dg Chest Portable 1 View  01/06/2014   CLINICAL DATA:  Chest pain and shortness of breath.  EXAM: PORTABLE CHEST - 1 VIEW  COMPARISON:  11/13/2013 and 08/10/2013  FINDINGS: Heart size and vascularity are normal and the lungs are clear. Surgical clips in the right peritracheal region consistent with previous thyroid surgery.  No osseous abnormality.  IMPRESSION: No significant abnormalities.   Electronically Signed   By: JimRozetta NunneryD.   On: 01/06/2014 10:28    Microbiology: No results found for this or any previous visit (from the past 240 hour(s)).   Labs: Basic Metabolic Panel:  Recent Labs Lab 01/06/14 1010 01/07/14 0116  NA 136* 139  K 4.5 4.8  CL 98 100  CO2 22 16*  GLUCOSE 188* 143*  BUN 28* 27*  CREATININE 1.32* 1.36*  CALCIUM 10.2 10.4   Liver Function Tests:  Recent Labs Lab 01/06/14 1010 01/07/14 0116  AST 13 14  ALT 11 10  ALKPHOS 85 86  BILITOT <0.2* 0.2*  PROT 7.3 7.6  ALBUMIN 3.6 3.7   No results for input(s): LIPASE, AMYLASE in the last 168 hours. No results for input(s): AMMONIA in the last 168 hours. CBC:  Recent Labs Lab 01/06/14 1010 01/07/14 0116  WBC 10.4 11.0*  NEUTROABS 6.6  --   HGB 10.8* 11.7*  HCT 33.1* 37.5  MCV 84.2 86.0  PLT 279 271   Cardiac Enzymes:  Recent Labs Lab 01/06/14 1010 01/06/14 1820 01/07/14 0116 01/07/14 0845  TROPONINI <0.30 <0.30 <0.30 <0.30   BNP: BNP (last 3 results)  Recent Labs  08/08/13 1600 01/06/14 1010   PROBNP 155.5* 14.5   CBG:  Recent Labs Lab 01/06/14 1651 01/06/14  2057 01/07/14 0734  GLUCAP 119* 129* 122*       Signed:  Kelvin Cellar  [facility] 01/07/2014, 10:15 AM

## 2014-01-07 NOTE — Plan of Care (Signed)
Problem: Phase I Progression Outcomes Goal: Anginal pain relieved Outcome: Completed/Met Date Met:  01/07/14

## 2014-01-07 NOTE — Progress Notes (Signed)
UR completed.

## 2014-01-19 ENCOUNTER — Telehealth: Payer: Self-pay | Admitting: *Deleted

## 2014-01-19 NOTE — Telephone Encounter (Signed)
Patient called reporting she has "sharp pains to her hands and feels hot and cold all the time.  Pain started a month ago.  Hot and cold started three weeks ago.  Saw PCP and was told this is a side effect of the Arimidex."  Temp = 97.8 today and denies any fevers.  Has already gone through menopause and says this is worse than menopause.  Takes pain medicine for herniated disc.    Notified Selena Lesser NP with symptom management clinic.  Verbal order received and read back for patient to hold Arimidex for one to two weeks and f/u with [facility] for evaluation.   Called and notified patient to stop Arimidex and to expect a call with a f/u appointment.  Asked if she could take it every other day.  Expressed that this won't hurt to be off this timeframe and we need to determine the cause.

## 2014-01-29 IMAGING — CR DG CHEST 2V
2 series · 2 of 2 positions shown · non-contrast
Comparison: 06/03/2011

CLINICAL DATA: GI bleed and fatigue.

CHEST - 2 VIEW

[w chest lat]
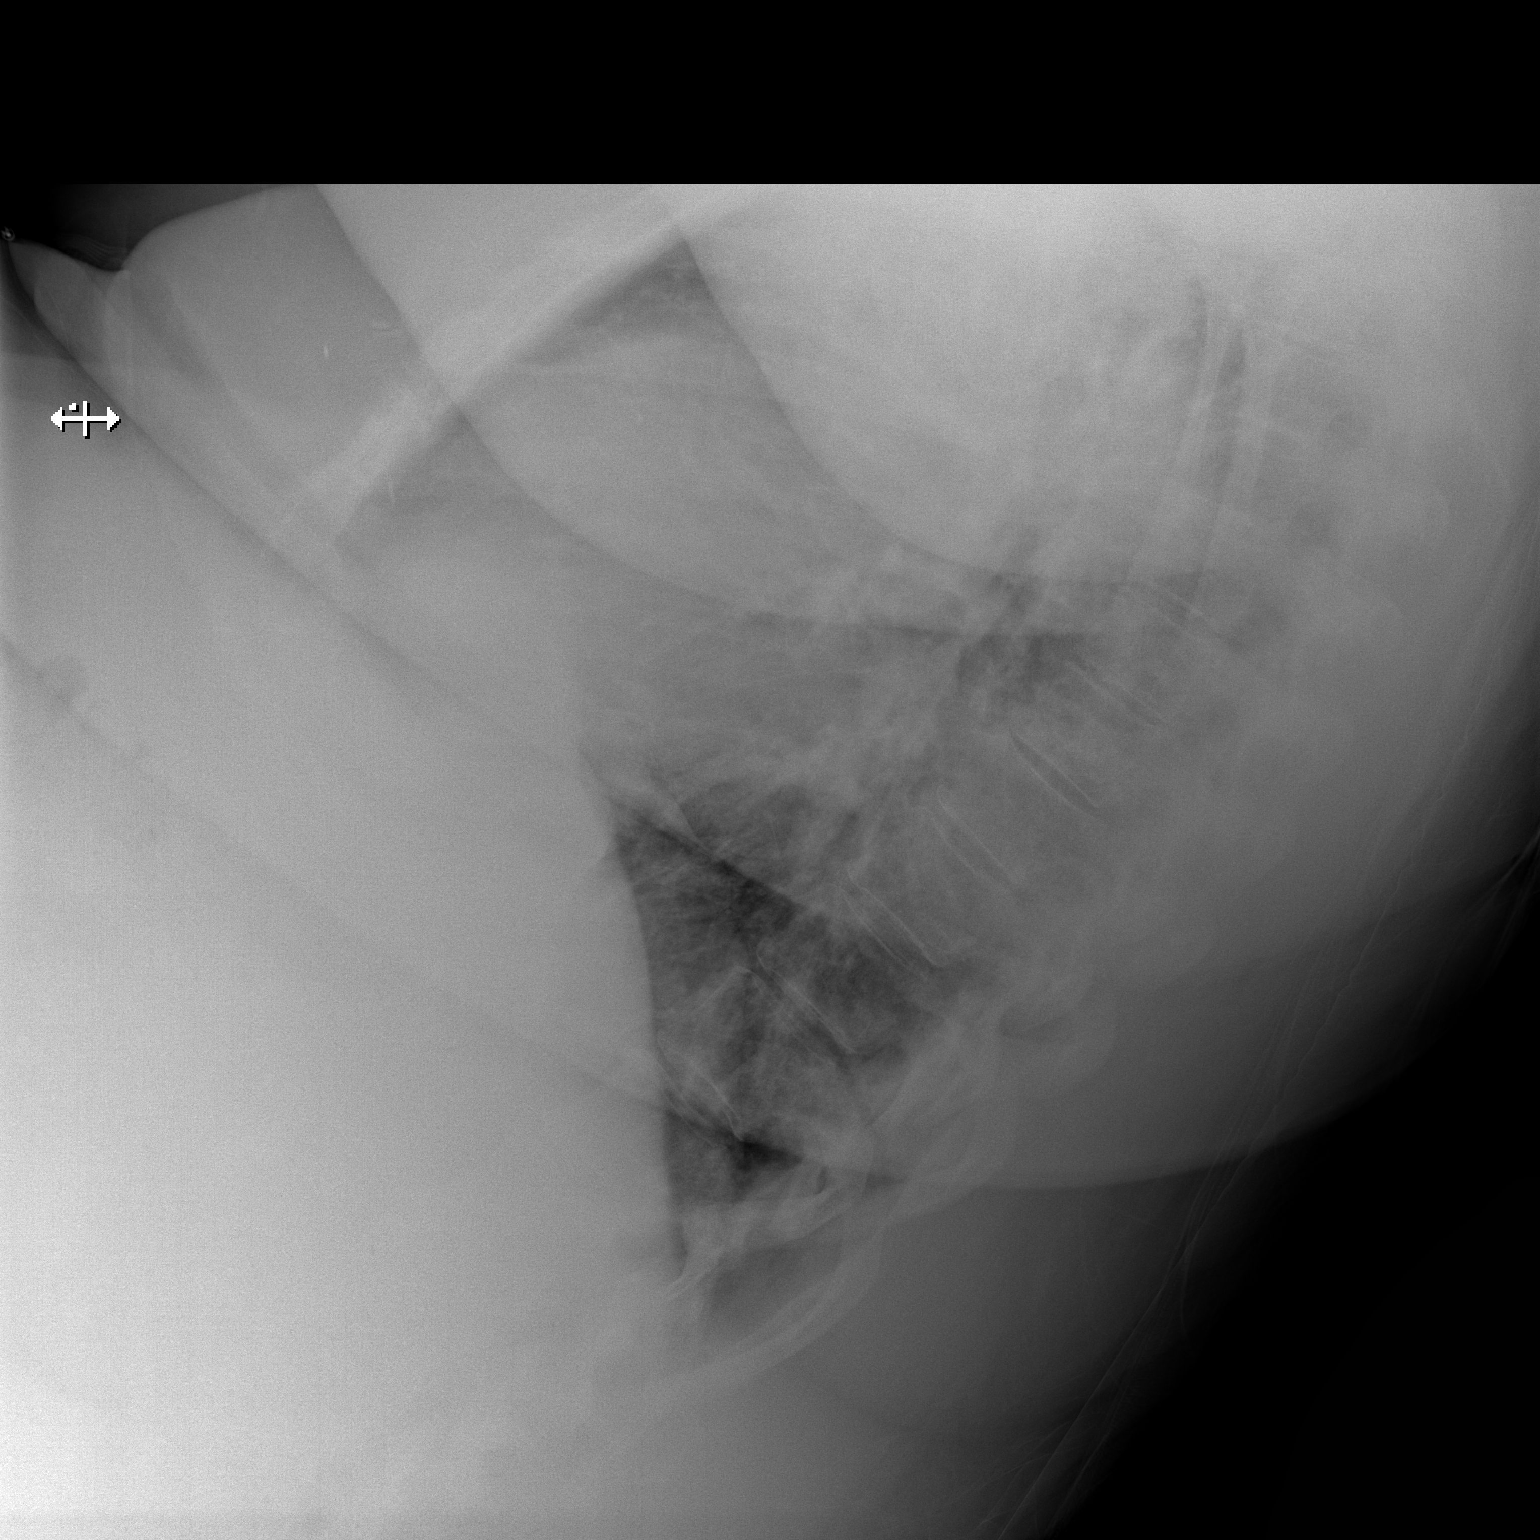

[x chest ap]
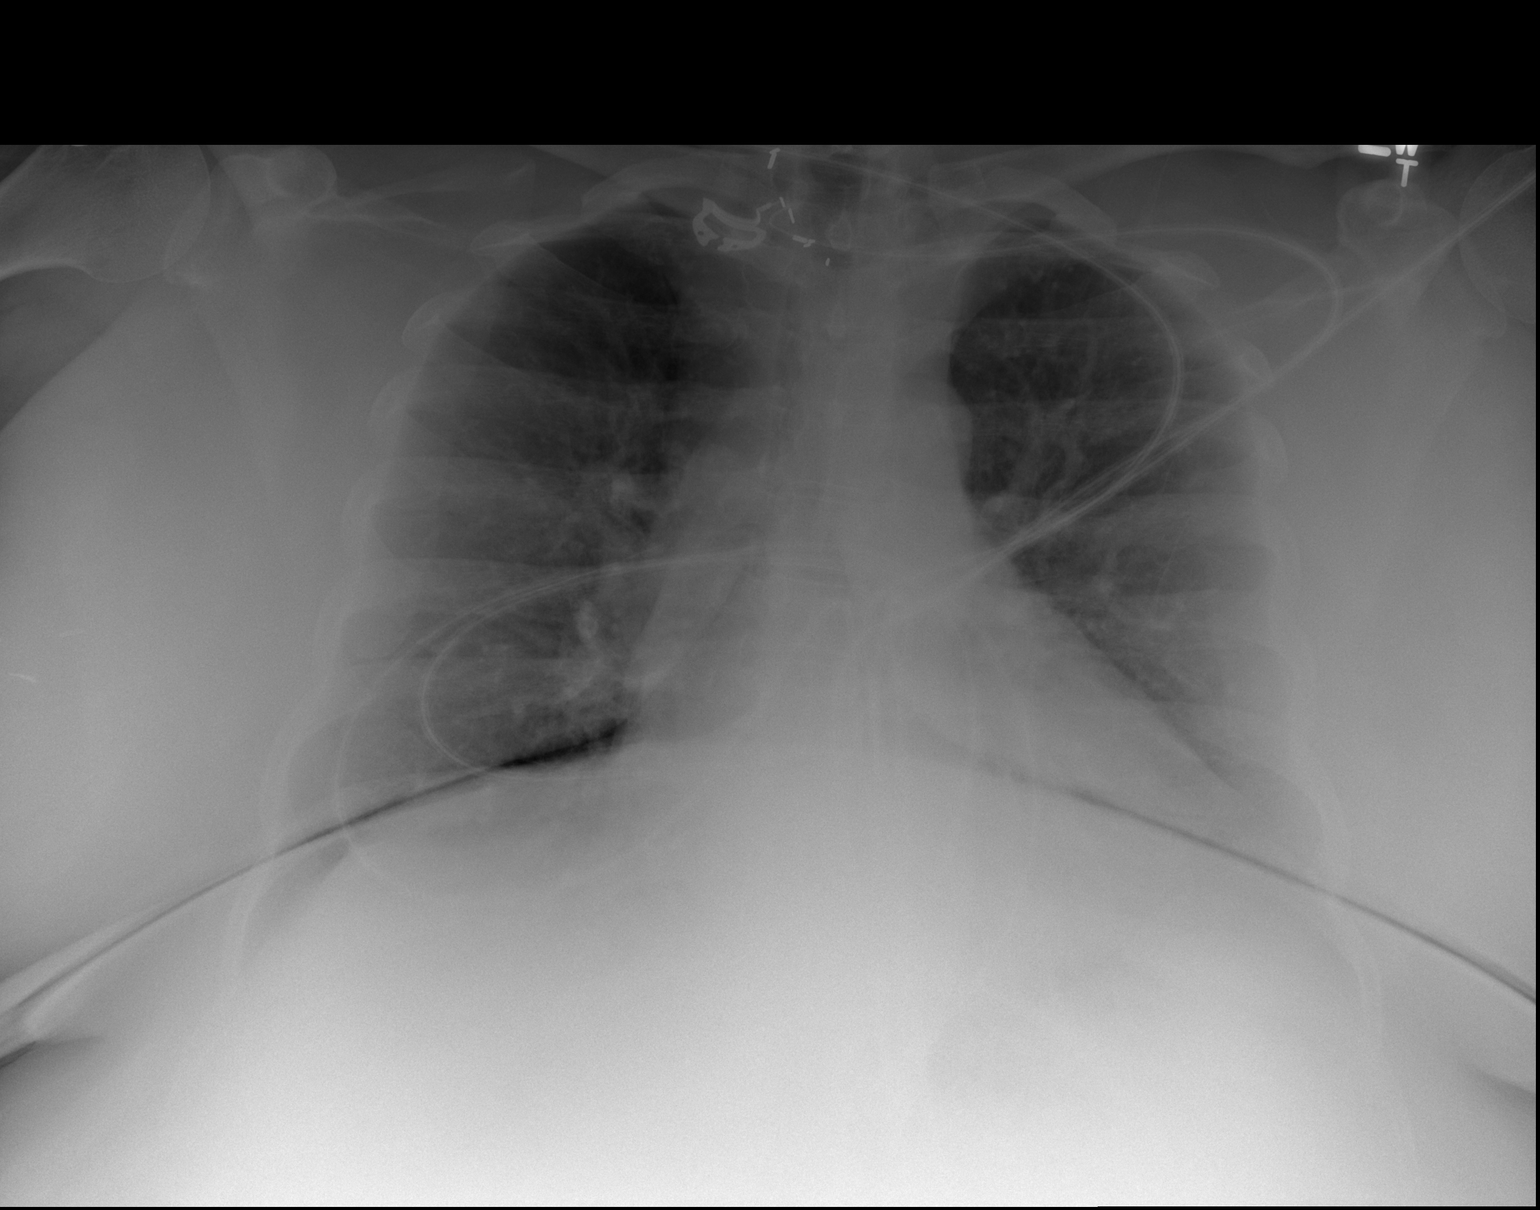

[2 of 2 positions shown; findings below may reference images not displayed]

FINDINGS: Two views of the chest demonstrate clear lungs. Heart
size is upper limits of normal but unchanged.  Surgical clips in
the upper chest.  No definite pleural effusions.
IMPRESSION: No acute chest findings.

## 2014-02-02 ENCOUNTER — Telehealth: Payer: Self-pay | Admitting: *Deleted

## 2014-02-02 ENCOUNTER — Telehealth: Payer: Self-pay | Admitting: Oncology

## 2014-02-02 ENCOUNTER — Other Ambulatory Visit: Payer: Self-pay | Admitting: *Deleted

## 2014-02-02 NOTE — Telephone Encounter (Signed)
s.w. pt and advised on Jan appt.Marland KitchenMarland KitchenMarland KitchenMarland Kitchenpt ok and aware

## 2014-02-02 NOTE — Progress Notes (Signed)
Pt called to this RN to give update status post stopping arimidex 01/19/2014.  [name] states overall symptoms have improved.  Plan per call is pt will stay of medication and appointment will be made for follow up with [facility]/PA for discussion of other therapy for benefit.

## 2014-02-02 NOTE — Telephone Encounter (Signed)
See other entry same date

## 2014-02-24 ENCOUNTER — Encounter: Payer: Self-pay | Admitting: Nurse Practitioner

## 2014-02-24 ENCOUNTER — Ambulatory Visit (HOSPITAL_BASED_OUTPATIENT_CLINIC_OR_DEPARTMENT_OTHER): Payer: Medicare Other

## 2014-02-24 ENCOUNTER — Ambulatory Visit (HOSPITAL_BASED_OUTPATIENT_CLINIC_OR_DEPARTMENT_OTHER): Payer: Medicare Other | Admitting: Nurse Practitioner

## 2014-02-24 ENCOUNTER — Telehealth: Payer: Self-pay | Admitting: Nurse Practitioner

## 2014-02-24 VITALS — BP 117/72 | HR 114 | Temp 98.9°F | Resp 18 | Ht 65.0 in | Wt 298.1 lb

## 2014-02-24 DIAGNOSIS — C50411 Malignant neoplasm of upper-outer quadrant of right female breast: Secondary | ICD-10-CM

## 2014-02-24 DIAGNOSIS — L659 Nonscarring hair loss, unspecified: Secondary | ICD-10-CM

## 2014-02-24 DIAGNOSIS — D0511 Intraductal carcinoma in situ of right breast: Secondary | ICD-10-CM

## 2014-02-24 DIAGNOSIS — M2550 Pain in unspecified joint: Secondary | ICD-10-CM | POA: Insufficient documentation

## 2014-02-24 DIAGNOSIS — Z170 Estrogen receptor positive status [ER+]: Secondary | ICD-10-CM

## 2014-02-24 DIAGNOSIS — R232 Flushing: Secondary | ICD-10-CM

## 2014-02-24 LAB — CBC WITH DIFFERENTIAL/PLATELET
BASO%: 1.4 % (ref 0.0–2.0)
BASOS ABS: 0.1 10*3/uL (ref 0.0–0.1)
EOS%: 6 % (ref 0.0–7.0)
Eosinophils Absolute: 0.6 10*3/uL — ABNORMAL HIGH (ref 0.0–0.5)
HCT: 33.2 % — ABNORMAL LOW (ref 34.8–46.6)
HEMOGLOBIN: 10.4 g/dL — AB (ref 11.6–15.9)
LYMPH%: 20.3 % (ref 14.0–49.7)
MCH: 26.6 pg (ref 25.1–34.0)
MCHC: 31.3 g/dL — AB (ref 31.5–36.0)
MCV: 84.9 fL (ref 79.5–101.0)
MONO#: 0.6 10*3/uL (ref 0.1–0.9)
MONO%: 5.9 % (ref 0.0–14.0)
NEUT%: 66.4 % (ref 38.4–76.8)
NEUTROS ABS: 6.6 10*3/uL — AB (ref 1.5–6.5)
Platelets: 241 10*3/uL (ref 145–400)
RBC: 3.91 10*6/uL (ref 3.70–5.45)
RDW: 16 % — AB (ref 11.2–14.5)
WBC: 10 10*3/uL (ref 3.9–10.3)
lymph#: 2 10*3/uL (ref 0.9–3.3)

## 2014-02-24 MED ORDER — LETROZOLE 2.5 MG PO TABS: 2.5000 mg | ORAL_TABLET | Freq: Every day | ORAL | Status: DC
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
## 2014-02-24 NOTE — Telephone Encounter (Signed)
, °

## 2014-03-13 ENCOUNTER — Telehealth: Payer: Self-pay | Admitting: *Deleted

## 2014-03-13 NOTE — Telephone Encounter (Signed)
MESSAGE RECEIVED FROM PT STATING CONCERNS WITH SIDE EFFECTS FROM MEDICATION.  Return call number given as [phone].  This RN returned call to pt.  She states she is having " severe hot and cold flashes all day." " Joint pain - just awful "  " dizziness" "Metallic taste in mouth "  She discussed side effects of anastrazole were not " not as severe but lasted for 3 months until you guys told me to stop it "  Per discussion pt states side effects are severe and affecting her abilities to maintain ADL's.  This RN discussed side effects as well as known other medical diagnosis that could be attributing to symptoms. Discussed reason pt is on the medication   At present pt will hold the letrozole due to symptoms.  THIS NOTE WILL BE SENT TO HEATHER FERRELL FOR REVIEW AND ADDITIONAL RECOMMENDATIONS.

## 2014-03-13 NOTE — Telephone Encounter (Signed)
MESSAGE ON VM IN TRIAGE  Pt stated " I have started a new medication and it is having a lot of side effects \.  Please call me asap  [phone] return call number.  THIS CALL WILL BE SENT TO DESK RN FOR FOLLOW UP AND PT CONTACT.

## 2014-03-15 ENCOUNTER — Telehealth: Payer: Self-pay

## 2014-03-15 ENCOUNTER — Telehealth: Payer: Self-pay | Admitting: Nurse Practitioner

## 2014-03-15 NOTE — Telephone Encounter (Signed)
Pt called again (she called 03/13/14) that she stopped letrozole about 4 days ago and she is still having SE. Her SE are joint pains, hot and cold spells, dizziness, hair is coming out, diarrhea, metallic taste in mouth. Her prior experience with anastrazole is that the SE took about 1-1.5 months to go away. I told her I would let heather ferrell know this again to evaluate this data.

## 2014-03-15 NOTE — Telephone Encounter (Signed)
Patient to continue to hold letrozole. Symptoms sound like they could be thyroid related. She will have a thyroid panel with her endocrinologist in mid February. Putting in POF for patient to see me on 2/29 to discuss antiestrogen therapy moving forward.

## 2014-03-16 ENCOUNTER — Telehealth: Payer: Self-pay | Admitting: Nurse Practitioner

## 2014-03-16 NOTE — Telephone Encounter (Signed)
per pof to sch pt appt-cld * spoke to pt and gave pt time & date of appt-pt understood

## 2014-03-21 IMAGING — CR DG CHEST 2V
2 series · 2 of 2 positions shown · non-contrast
Comparison: August 03, 2012

CLINICAL DATA: Chest pain

CHEST - 2 VIEW

[w chest pa]
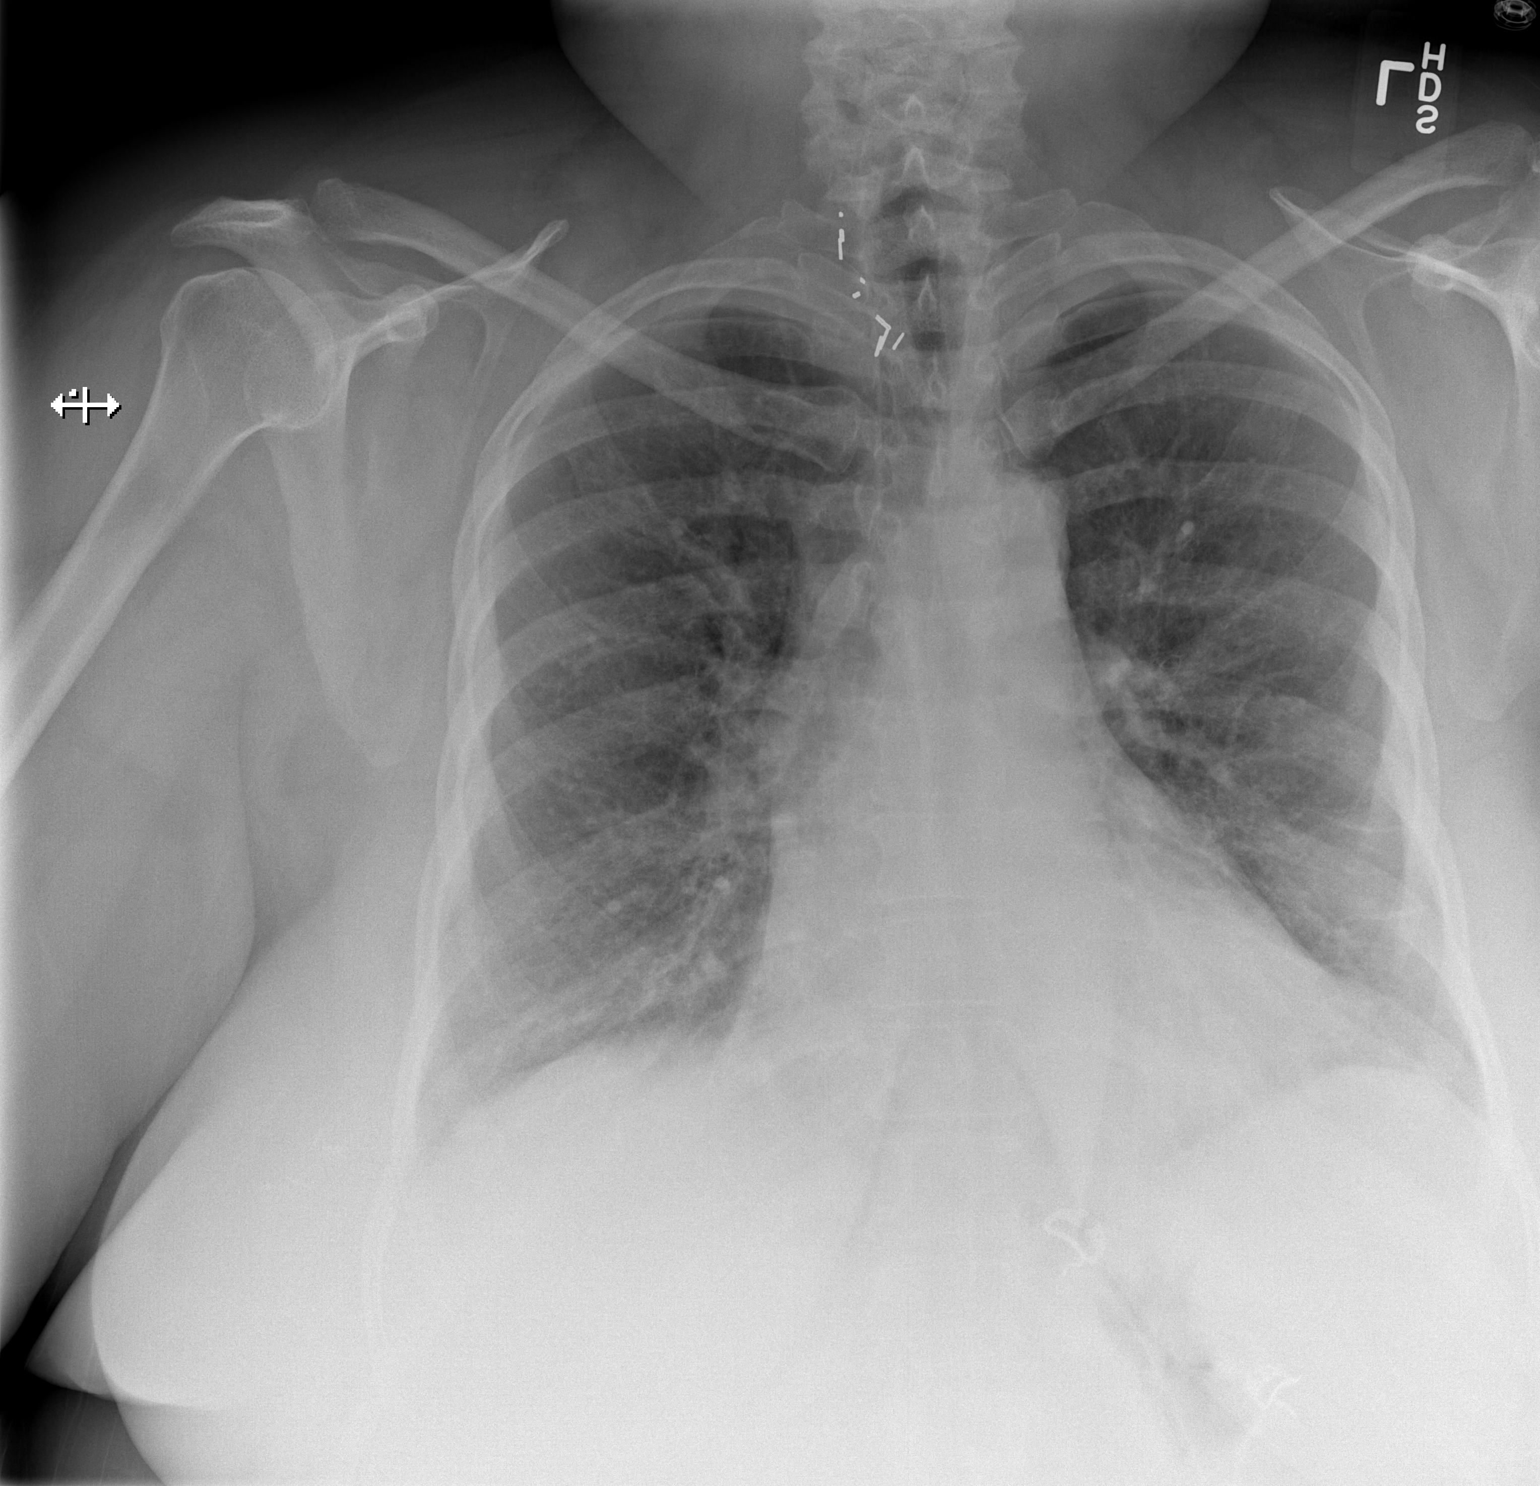

[w chest lat]
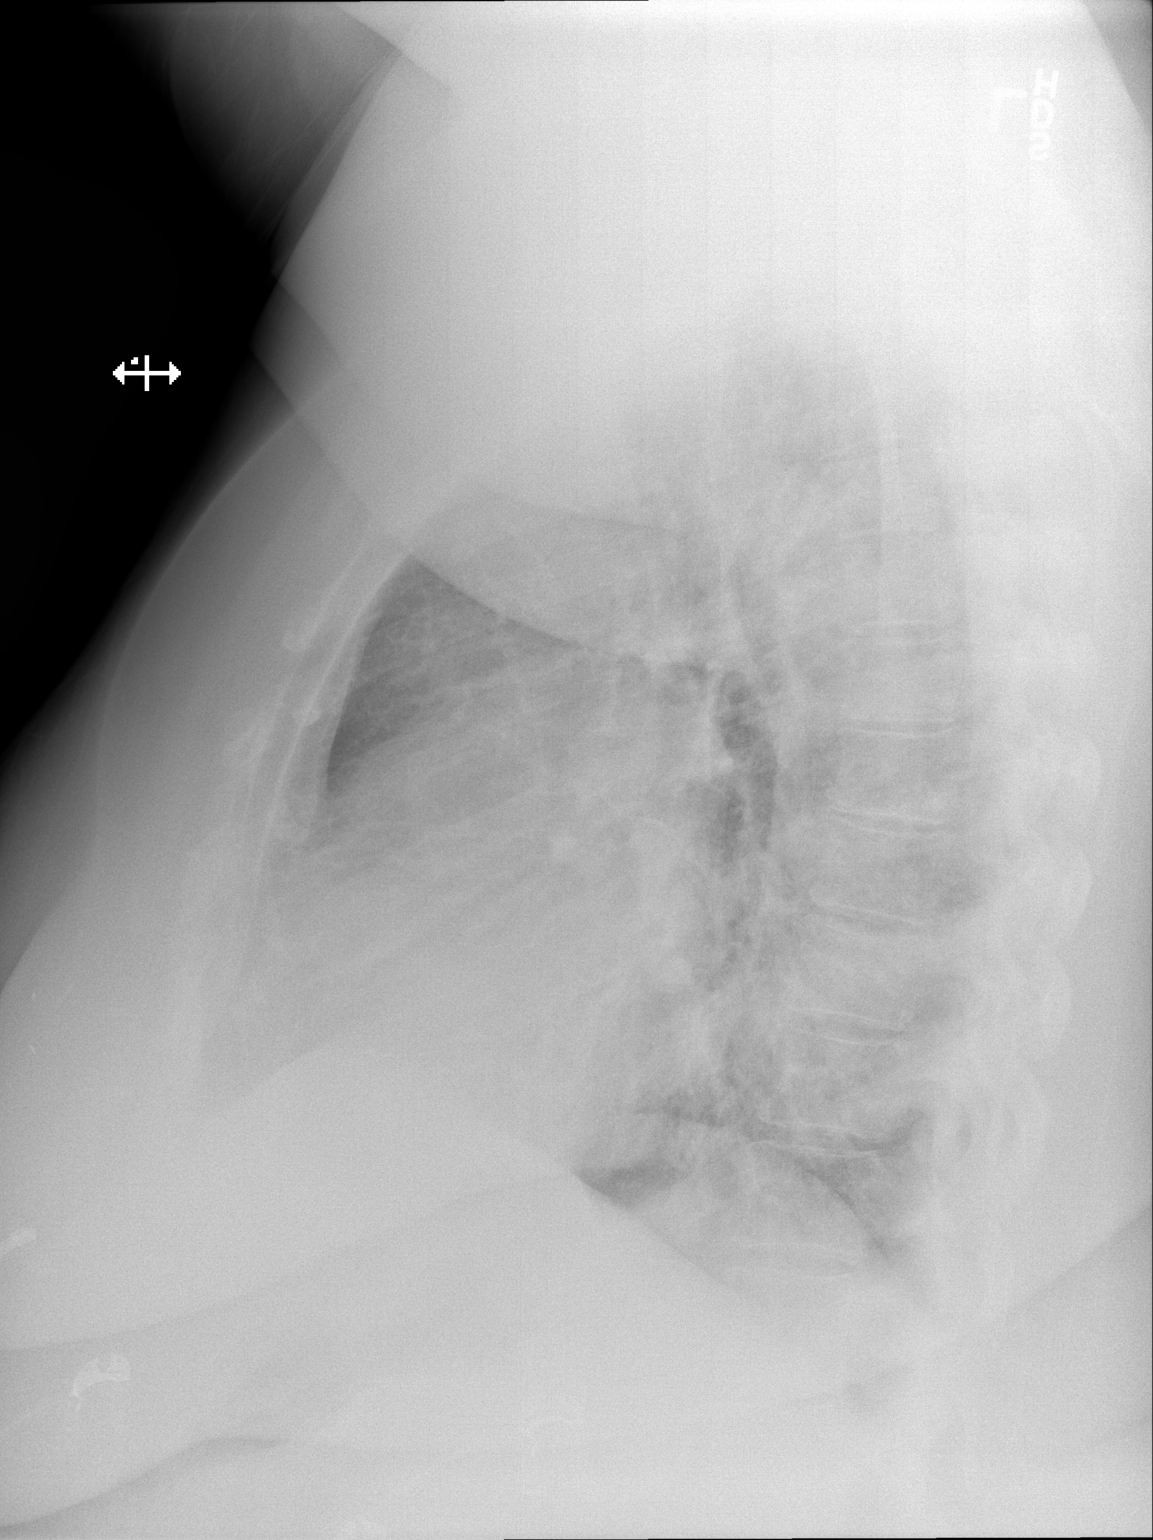

[2 of 2 positions shown; findings below may reference images not displayed]

FINDINGS: There is a subsegmental atelectasis in the left lower
lobe.  The lungs are otherwise clear.  Heart is borderline
prominent with normal pulmonary vascularity.  No adenopathy.  There
is postoperative change in the right neck region.  There are no
appreciable bone lesions.
IMPRESSION: Left base atelectasis.  No edema or consolidation.

## 2014-04-03 ENCOUNTER — Inpatient Hospital Stay: Admission: RE | Admit: 2014-04-03 | Payer: Medicare Other | Source: Ambulatory Visit

## 2014-04-07 ENCOUNTER — Ambulatory Visit: Payer: Medicare Other | Admitting: Cardiovascular Disease

## 2014-04-14 ENCOUNTER — Ambulatory Visit
Admission: RE | Admit: 2014-04-14 | Discharge: 2014-04-14 | Disposition: A | Discharge status: Home / Self Care | Payer: Medicare Other | Source: Ambulatory Visit | Attending: Nurse Practitioner | Admitting: Nurse Practitioner

## 2014-04-14 DIAGNOSIS — C50411 Malignant neoplasm of upper-outer quadrant of right female breast: Secondary | ICD-10-CM

## 2014-04-17 ENCOUNTER — Other Ambulatory Visit (HOSPITAL_BASED_OUTPATIENT_CLINIC_OR_DEPARTMENT_OTHER): Payer: Medicare Other

## 2014-04-17 ENCOUNTER — Telehealth: Payer: Self-pay | Admitting: Nurse Practitioner

## 2014-04-17 ENCOUNTER — Ambulatory Visit (HOSPITAL_BASED_OUTPATIENT_CLINIC_OR_DEPARTMENT_OTHER): Payer: Medicare Other | Admitting: Nurse Practitioner

## 2014-04-17 ENCOUNTER — Encounter: Payer: Self-pay | Admitting: Nurse Practitioner

## 2014-04-17 VITALS — BP 91/60 | HR 17 | Temp 98.9°F | Resp 18 | Ht 65.0 in | Wt 291.1 lb

## 2014-04-17 DIAGNOSIS — C50411 Malignant neoplasm of upper-outer quadrant of right female breast: Secondary | ICD-10-CM

## 2014-04-17 DIAGNOSIS — Z853 Personal history of malignant neoplasm of breast: Secondary | ICD-10-CM

## 2014-04-17 LAB — CBC WITH DIFFERENTIAL/PLATELET
BASO%: 0.4 % (ref 0.0–2.0)
Basophils Absolute: 0.1 10*3/uL (ref 0.0–0.1)
EOS%: 4.9 % (ref 0.0–7.0)
Eosinophils Absolute: 0.6 10*3/uL — ABNORMAL HIGH (ref 0.0–0.5)
HCT: 34.6 % — ABNORMAL LOW (ref 34.8–46.6)
HGB: 11.2 g/dL — ABNORMAL LOW (ref 11.6–15.9)
LYMPH#: 2.6 10*3/uL (ref 0.9–3.3)
LYMPH%: 21.6 % (ref 14.0–49.7)
MCH: 27.7 pg (ref 25.1–34.0)
MCHC: 32.4 g/dL (ref 31.5–36.0)
MCV: 85.4 fL (ref 79.5–101.0)
MONO#: 0.5 10*3/uL (ref 0.1–0.9)
MONO%: 3.8 % (ref 0.0–14.0)
NEUT%: 69.3 % (ref 38.4–76.8)
NEUTROS ABS: 8.4 10*3/uL — AB (ref 1.5–6.5)
Platelets: 279 10*3/uL (ref 145–400)
RBC: 4.05 10*6/uL (ref 3.70–5.45)
RDW: 15.5 % — AB (ref 11.2–14.5)
WBC: 12.2 10*3/uL — ABNORMAL HIGH (ref 3.9–10.3)

## 2014-04-17 LAB — COMPREHENSIVE METABOLIC PANEL (CC13)
ALT: 9 U/L (ref 0–55)
ANION GAP: 12 meq/L — AB (ref 3–11)
AST: 10 U/L (ref 5–34)
Albumin: 3.8 g/dL (ref 3.5–5.0)
Alkaline Phosphatase: 88 U/L (ref 40–150)
BUN: 22.1 mg/dL (ref 7.0–26.0)
CHLORIDE: 100 meq/L (ref 98–109)
CO2: 24 meq/L (ref 22–29)
Calcium: 10.2 mg/dL (ref 8.4–10.4)
Creatinine: 1.6 mg/dL — ABNORMAL HIGH (ref 0.6–1.1)
EGFR: 41 mL/min/{1.73_m2} — AB (ref >90)
Glucose: 108 mg/dl (ref 70–140)
POTASSIUM: 4.8 meq/L (ref 3.5–5.1)
SODIUM: 136 meq/L (ref 136–145)
Total Bilirubin: 0.24 mg/dL (ref 0.20–1.20)
Total Protein: 7.2 g/dL (ref 6.4–8.3)

## 2014-04-17 NOTE — Telephone Encounter (Signed)
per pof to sch appt-gave pt copy of sch
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
## 2014-05-11 ENCOUNTER — Telehealth: Payer: Self-pay | Admitting: *Deleted

## 2014-05-11 NOTE — Telephone Encounter (Signed)
Patient called to say she wanted a prescription for bras from [facility] so Medicaid/Medicare would pay for them.  She had a lumpectomy and does not require a prosthesis.  Confirmed with [facility] that she would not qualify for bras.  They said if she comes into the shop they will evaluate her to see if a prothesis is required for symmetry of the breasts and send an RX if one is needed. Passed that information on to the patient.

## 2014-05-26 ENCOUNTER — Ambulatory Visit: Payer: Medicare Other | Admitting: Nurse Practitioner

## 2014-05-26 ENCOUNTER — Other Ambulatory Visit: Payer: Medicare Other

## 2014-06-08 ENCOUNTER — Telehealth: Payer: Self-pay | Admitting: Nurse Practitioner

## 2014-06-08 NOTE — Telephone Encounter (Signed)
Spoke with patient and moved her from heather to kristin per pof req under another patient   anne

## 2014-06-10 NOTE — H&P (Signed)
PATIENT NAME:  [name], [name] MR#:  [id number] DATE OF BIRTH:  Jan 03, 1956  DATE OF ADMISSION:  11/13/2013  REFERRING EMERGENCY ROOM PHYSICIAN: Dr. Dineen Kid   CHIEF COMPLAINT: Body aches, shortness of breath and congestion.   HISTORY OF PRESENT ILLNESS: This 59 year old African American female with past medical history of diabetes, hypertension, obesity, presents today with 4 to 5 days of generalized body aches, sinus pressure, shortness of breath. She reports that she has been very cold and had some sweats but has not measured any temperature at home. She has had one episode of diarrhea, persistent nausea with no vomiting. She reports that she has been able to eat and drink normally. She has had a cough throughout this time with dark yellow sputum, no hemoptysis. She reports that she has had wheezing, shortness of breath with any exertion. No orthopnea or edema. Her son and daughter-in-law have had similar symptoms but have recovered from their illness. She has not had her flu shot this year. In the Emergency Room, she was given fentanyl for chronic back pain and at that time, her blood pressure dropped to 63/45 transiently. After 3 liters of fluid, her blood pressure has returned to normal in the 120/80 range, and she is feeling much better. Hospitalist has been asked to admit for observation.   PAST MEDICAL HISTORY: 1. Hypertension.  2. Diabetes.  3. Hypothyroidism.  4. Obesity.  5. History of breast cancer in 2014 status post lumpectomy with radiation.  6. History of pancreatitis.  7. Herniated lumbar disk x3.  PAST SURGICAL HISTORY:  1. Lumpectomy 2014.  2. Bilateral total knee replacements.  3. Right foot surgical repair.  4. Cholecystectomy.   SOCIAL HISTORY: The patient lives alone in her own home in [place]. She uses a cane or a walker due to the pain in her knees and low back. She is a former smoker; quit more than 20 years ago. She does not drink alcohol currently; quit greater  than 20 years ago. Denies illicit substance abuse. She is disabled currently. She formerly worked in Teacher, adult education.   FAMILY HISTORY: Her mother had a stroke and heart attack. She has two brothers have had early heart attacks. Her mother also had breast cancer.   ALLERGIES:  1. SHELLFISH CAUSES ANAPHYLAXIS.  2. MORPHINE CAUSES ALTERED MENTAL STATUS.  3. CODEINE CAUSES HIVES.   HOME MEDICATIONS: 1. Voltaren topical gel 1% apply to affected area as needed for pain.  2. Valtrex 1 gram oral tablet 1 tablet once a day.  3. Trazodone 100 mg 1 tablet 3 times a day.  4. Tramadol 50 mg 1 tablet orally every 4 hours as needed for pain.  5. Rosuvastatin 20 mg 1 tablet once a day at bedtime.  6. Perphenazine 2 mg 1 tablet twice a day.  7. Paroxetine 20 mg 1 tablet once a day.  8. Nexium 40 mg 1 capsule once a day.  9. Metformin-sitagliptin 500/50 mg oral tablet 1 tablet twice a day.  10. Meloxicam 15 mg 1 tablet once a day.  11. Lisinopril 2.5 mg 1 tablet once a day.  12. Levothyroxine 200 mcg 1 tablet once a day.  13. Levemir FlexPen 100 units/mL subcutaneous solution 15 units subcutaneously once a day at bedtime.  14. Hydrochlorothiazide/valsartan 12.5/160 mg oral tablet 1 tablet once a day.  15. Gabapentin 100 mg oral capsule, 1 capsule 3 times a day.  16. Desonide topical ointment 0.05% apply to affected area 3 times a day.  17. Cholecalciferol  1000 international units 1 tablet once a day.  18. Cetirizine 100 mg 1 tablet once a day.    REVIEW OF SYSTEMS: CONSTITUTIONAL: Positive for subjective fevers and chills, fatigue, weakness. Negative for weight change.  HEENT: No change in hearing or vision. No pain in eyes or ears. Positive for throat pain, swollen throughout, pain with swallowing, sinus pain.  RESPIRATORY: Positive for cough, wheezing, sputum production. Negative for hemoptysis or painful respiration.  CARDIOVASCULAR: Negative for chest pain, orthopnea or edema. No palpitations or  syncope.  GASTROINTESTINAL: Positive for nausea and diarrhea, no vomiting or abdominal pain. No hematemesis. No melena or hematochezia.  GENITOURINARY: Negative for dysuria or frequency.  MUSCULOSKELETAL: Positive for diffuse body aches, myalgias. No swollen or tender joints. She does have chronic arthritis with back pain and knee pain. No gout.  NEUROLOGIC: No focal numbness or weakness. No dysarthria or confusion. No headaches, CVA or seizure.  PSYCHIATRIC: No change in mood, no uncontrolled anxiety or depression. She does have baseline depression and is treated for.   PHYSICAL EXAMINATION: VITAL SIGNS: Temperature 98, pulse 88, respirations 20, blood pressure 118/84, oxygenation 95% on room air.  GENERAL: The patient is fatigued-appearing, resting comfortably in the exam bed.  HEENT: Pupils are equal, round, and reactive to light. Conjunctivae are clear with no icterus and no injection. Extraocular motions are intact.  OROPHARYNX: Clear with pink mucous membranes, good dentition. She does have enlarged tonsils, but there is no exudate, erythema or suggestive of edema, no cervical lymphadenopathy.  NECK: Obese, supple, thyroid is not palpated.  RESPIRATORY: Lungs are clear to auscultation bilaterally with good air movement.  CARDIOVASCULAR: Heart sounds are distant, regular rate and rhythm, no murmurs, rubs, or gallops, no peripheral edema. Peripheral pulses are 2+.  ABDOMEN: Soft, nontender, nondistended. No hepatosplenomegaly. No guarding, no rebound.  EXTREMITIES: There are no hot, tender, swollen joints. Range of motion is normal. Strength is 5 out of 5 throughout.  NEUROLOGIC: Cranial nerves II through XII are grossly intact. Strength and sensation are intact, nonfocal neurologic examination.  PSYCHIATRIC: The patient is alert, oriented x4. No signs of uncontrolled depression or anxiety.   LABORATORY DATA: White blood cell count 9.7, hemoglobin 10.2, MCV 86, platelets 176,000. Sodium 139,  potassium 4.7, chloride 105, bicarbonate 27, BUN 34, creatinine 1.6. GFR is 43. Serum glucose 113. Troponin less than 0.02, magnesium 1.5, phosphorus 4.8. Urinalysis is negative for signs of infection. ABG shows pH of 7.36, pCO2 of 39, pO2 of 62. Lactic acid is 1.7.   IMAGING: Chest x-ray shows no active cardiopulmonary disease.   ASSESSMENT AND PLAN: 1. Transient hypotension: This occurred after administration of fentanyl for back pain. Blood pressure went to 63/45. After 3 liters' volume resuscitation, blood pressure is back in the normal range and the patient is feeling better. Will continue hydration. Will hold antihypertensive agents. Will monitor her overnight.  2. Flulike illness with hypoxemia, pO2 of 62 on ABG: Blood cultures are pending. Chest x-ray negative for signs of pneumonia. Urinalysis is negative. Influenza A and B screens are pending. Will continue with nebulizers p.r.n. Will continue with a azithromycin for atypical pneumonia versus bronchitis. Currently, oxygen saturation on room air is greater than 90%. She is in no respiratory distress at this time.  3. Chronic kidney disease stage III versus acute renal failure: We do not have a baseline creatinine measurement for this patient. At this time, will hold lisinopril and metformin. Will provide hydration and recheck renal function in the morning. Electrolytes are  stable at this time.  4. Anemia with hemoglobin of 10.2: We do not have a baseline hemoglobin for this patient. This can be followed up as an outpatient. She has not seen any bleeding, no concern for current bleeding at this time.  5. Hypothyroidism: Continue Synthroid.  6. Hyperlipidemia: Continue statin.  7. Chronic back pain: Will attempt to continue with Tylenol and tramadol as much as possible. She is ALLERGIC TO MORPHINE WITH ALTERED MENTAL STATUS. She had hypotension with fentanyl. She may have an acute kidney injury, so trying to avoid NSAIDs.  8. Diabetes mellitus:  Will continue with basal insulin and sliding scale. Holding metformin at this time.  9. Depression: Seems controlled at this time. Continue with selective serotonin reuptake inhibitor.  10. HSV suppression: Continue with valaciclovir daily.  11. Hypomagnesemia: May be due to gastrointestinal losses with diarrhea. Replete and recheck in the morning.   TIME SPENT ON THIS ADMISSION: 45 minutes.     ____________________________ Earleen Newport. Volanda Napoleon, MD cpw:lm D: 11/13/2013 16:12:46 ET T: 11/13/2013 19:52:38 ET JOB#: [id number]  cc: Barnetta Chapel P. Volanda Napoleon, MD, <Dictator> Aldean Jewett MD ELECTRONICALLY SIGNED 11/18/2013 17:00

## 2014-06-10 NOTE — Discharge Summary (Signed)
PATIENT NAME:  [name], [name] MR#:  [id number] DATE OF BIRTH:  July 23, 1955  DATE OF ADMISSION:  11/13/2013 DATE OF DISCHARGE:  11/15/2013  For a detailed note please take a look at the history and physical done on admission by Dr. Myrtis Ser.   DIAGNOSES AT DISCHARGE:  1.  Acute bronchitis.  2.  Transient hypotension due to sedative medications.   3.  Hypertension.   4.  Acute renal failure.   5.  Hyperlipidemia.   6.  Obstructive sleep apnea.   7.  Hypothyroidism.   8.  Diabetes.   DIET:  The patient is being discharged on a low-sodium, low-fat, carbohydrate -controlled diet.   ACTIVITY: As tolerated.   FOLLOWUP: With her primary care physician in [place] in the next 1-2 weeks.    DISCHARGE MEDICATIONS: Levemir FlexPen 15 units at bedtime, cetirizine 10 mg daily, vitamin D 1000 international units daily, desonide topical 0.05% topical ointment to be applied t.i.d., Voltaren 1% topical cream to be applied to affected area as needed, Nexium 40 mg daily, gabapentin 100 milligrams t.i.d., lisinopril 2.5 mg daily, meloxicam 15 mg daily, Paxil 20 mg daily, perphenazine 2 mg b.i.d., Crestor 20 mg at bedtime, metformin/Januvia 500/50 one tab b.i.d., tramadol 50 mg 1 tab q. 4 hours as needed, trazodone 100 mg t.i.d., HCTZ/valsartan 12.5/160 one tab daily, Valtrex 1 gram daily, Synthroid 200 mcg daily, and Zithromax 500 mg daily x 3 days.   PERTINENT STUDIES DONE DURING THE HOSPITAL COURSE: A chest x-ray done on admission showing no acute cardiopulmonary disease.   BRIEF HOSPITAL COURSE: This is a 59 year old female who presented to the hospital with shortness of breath, cough, and a flulike illness, and had transient hypotension in the Emergency Room.   1.  Acute bronchitis. This was likely the cause of the patient's flulike symptoms and congestion. The patient was started on Zithromax, has clinically improved since then. Still continues to have a cough. Her chest x-ray although  is  negative for pneumonia. She will continue treatment with Zithromax as stated and continue taking antitussives as an outpatient.  2.  Hypotension. The patient developed some transient hypotension after she received some IV fentanyl in the Emergency Room. She received some IV fluids and her hypotension since then has improved and resolved. She has had no clinical evidence of sepsis, her blood cultures have remained negative. Her orthostatic vital signs are also negative. She is therefore being discharged home presently.  3.  Acute renal failure. The patient does have some history of chronic kidney disease, but we do not have a baseline here to compare with. The patient was given some gentle IV fluids. Her creatinine since then has improved, probably close to baseline. Her Lasix and valsartan were held in the hospital although she will resume those upon discharge as her creatinine has improved.  4.  GERD.  The patient was maintained on her Protonix. She will resume that.  5.  The patient was maintained on her Levemir and sliding scale insulin. Her metformin and Januvia were held due to acute renal failure, although since her renal function is improved she can resume her metformin and Januvia upon discharge.  6.  Hyperlipidemia. The patient was maintained on her Crestor, she will resume that.  7.  Diabetic neuropathy. The patient was maintained on Neurontin, she will resume that.  8.  Hypothyroidism. The patient was maintained on her Synthroid and she will also resume that upon discharge.   CODE STATUS: The patient is a  full code.   DISPOSITION::  She is being discharged home.   TIME SPENT: 40 minutes.     ____________________________ Belia Heman. Verdell Carmine, MD vjs:bu D: 11/15/2013 14:34:57 ET T: 11/15/2013 17:49:53 ET JOB#: [id number]  cc: Belia Heman. Verdell Carmine, MD, <Dictator> Unknown CC Henreitta Leber MD ELECTRONICALLY SIGNED 11/29/2013 14:58

## 2014-06-26 ENCOUNTER — Ambulatory Visit (HOSPITAL_BASED_OUTPATIENT_CLINIC_OR_DEPARTMENT_OTHER): Payer: Medicare Other | Admitting: Oncology

## 2014-06-26 ENCOUNTER — Telehealth: Payer: Self-pay | Admitting: Oncology

## 2014-06-26 ENCOUNTER — Encounter: Payer: Self-pay | Admitting: Oncology

## 2014-06-26 ENCOUNTER — Other Ambulatory Visit (HOSPITAL_BASED_OUTPATIENT_CLINIC_OR_DEPARTMENT_OTHER): Payer: Medicare Other

## 2014-06-26 VITALS — BP 109/64 | HR 85 | Temp 98.4°F | Resp 18 | Wt 292.2 lb

## 2014-06-26 DIAGNOSIS — Z170 Estrogen receptor positive status [ER+]: Secondary | ICD-10-CM | POA: Diagnosis not present

## 2014-06-26 DIAGNOSIS — D0511 Intraductal carcinoma in situ of right breast: Secondary | ICD-10-CM

## 2014-06-26 DIAGNOSIS — C50911 Malignant neoplasm of unspecified site of right female breast: Secondary | ICD-10-CM

## 2014-06-26 DIAGNOSIS — C50411 Malignant neoplasm of upper-outer quadrant of right female breast: Secondary | ICD-10-CM

## 2014-06-26 LAB — COMPREHENSIVE METABOLIC PANEL (CC13)
ALT: 41 U/L (ref 0–55)
AST: 20 U/L (ref 5–34)
Albumin: 3.2 g/dL — ABNORMAL LOW (ref 3.5–5.0)
Alkaline Phosphatase: 87 U/L (ref 40–150)
Anion Gap: 10 mEq/L (ref 3–11)
BILIRUBIN TOTAL: 0.23 mg/dL (ref 0.20–1.20)
BUN: 22.8 mg/dL (ref 7.0–26.0)
CO2: 26 mEq/L (ref 22–29)
CREATININE: 1.2 mg/dL — AB (ref 0.6–1.1)
Calcium: 9.6 mg/dL (ref 8.4–10.4)
Chloride: 98 mEq/L (ref 98–109)
EGFR: 59 mL/min/{1.73_m2} — ABNORMAL LOW (ref >90)
Glucose: 126 mg/dl (ref 70–140)
Potassium: 4.9 mEq/L (ref 3.5–5.1)
SODIUM: 134 meq/L — AB (ref 136–145)
Total Protein: 6.7 g/dL (ref 6.4–8.3)

## 2014-06-26 LAB — CBC WITH DIFFERENTIAL/PLATELET
BASO%: 0.5 % (ref 0.0–2.0)
BASOS ABS: 0.1 10*3/uL (ref 0.0–0.1)
EOS%: 0.6 % (ref 0.0–7.0)
Eosinophils Absolute: 0.1 10*3/uL (ref 0.0–0.5)
HEMATOCRIT: 31.4 % — AB (ref 34.8–46.6)
HEMOGLOBIN: 10.4 g/dL — AB (ref 11.6–15.9)
LYMPH%: 17.8 % (ref 14.0–49.7)
MCH: 28.6 pg (ref 25.1–34.0)
MCHC: 33.1 g/dL (ref 31.5–36.0)
MCV: 86.4 fL (ref 79.5–101.0)
MONO#: 0.7 10*3/uL (ref 0.1–0.9)
MONO%: 4.9 % (ref 0.0–14.0)
NEUT#: 11 10*3/uL — ABNORMAL HIGH (ref 1.5–6.5)
NEUT%: 76.2 % (ref 38.4–76.8)
Platelets: 341 10*3/uL (ref 145–400)
RBC: 3.64 10*6/uL — ABNORMAL LOW (ref 3.70–5.45)
RDW: 17.1 % — AB (ref 11.2–14.5)
WBC: 14.5 10*3/uL — ABNORMAL HIGH (ref 3.9–10.3)
lymph#: 2.6 10*3/uL (ref 0.9–3.3)
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
## 2014-06-26 NOTE — Telephone Encounter (Signed)
Gave and printed appt sched and avs fo rpt; for NOV

## 2014-06-29 ENCOUNTER — Ambulatory Visit: Payer: Medicare Other | Admitting: Nurse Practitioner

## 2014-06-29 ENCOUNTER — Ambulatory Visit: Payer: Medicare Other | Admitting: Physician Assistant

## 2014-08-13 ENCOUNTER — Encounter (HOSPITAL_COMMUNITY): Payer: Self-pay

## 2014-08-13 ENCOUNTER — Observation Stay (HOSPITAL_COMMUNITY)
Admission: EM | Admit: 2014-08-13 | Discharge: 2014-08-16 | Disposition: A | Discharge status: Home / Self Care | Payer: Medicare Other | Attending: Dermatology | Admitting: Dermatology

## 2014-08-13 DIAGNOSIS — R630 Anorexia: Secondary | ICD-10-CM | POA: Diagnosis not present

## 2014-08-13 DIAGNOSIS — Z87891 Personal history of nicotine dependence: Secondary | ICD-10-CM | POA: Diagnosis not present

## 2014-08-13 DIAGNOSIS — Z043 Encounter for examination and observation following other accident: Secondary | ICD-10-CM | POA: Insufficient documentation

## 2014-08-13 DIAGNOSIS — N189 Chronic kidney disease, unspecified: Secondary | ICD-10-CM | POA: Diagnosis not present

## 2014-08-13 DIAGNOSIS — G4733 Obstructive sleep apnea (adult) (pediatric): Secondary | ICD-10-CM | POA: Insufficient documentation

## 2014-08-13 DIAGNOSIS — D649 Anemia, unspecified: Secondary | ICD-10-CM | POA: Diagnosis not present

## 2014-08-13 DIAGNOSIS — Z794 Long term (current) use of insulin: Secondary | ICD-10-CM | POA: Insufficient documentation

## 2014-08-13 DIAGNOSIS — K219 Gastro-esophageal reflux disease without esophagitis: Secondary | ICD-10-CM | POA: Diagnosis present

## 2014-08-13 DIAGNOSIS — R296 Repeated falls: Secondary | ICD-10-CM

## 2014-08-13 DIAGNOSIS — Z8619 Personal history of other infectious and parasitic diseases: Secondary | ICD-10-CM | POA: Diagnosis not present

## 2014-08-13 DIAGNOSIS — E039 Hypothyroidism, unspecified: Secondary | ICD-10-CM | POA: Diagnosis present

## 2014-08-13 DIAGNOSIS — C50411 Malignant neoplasm of upper-outer quadrant of right female breast: Secondary | ICD-10-CM | POA: Diagnosis present

## 2014-08-13 DIAGNOSIS — G219 Secondary parkinsonism, unspecified: Secondary | ICD-10-CM | POA: Insufficient documentation

## 2014-08-13 DIAGNOSIS — F329 Major depressive disorder, single episode, unspecified: Secondary | ICD-10-CM | POA: Diagnosis not present

## 2014-08-13 DIAGNOSIS — G43909 Migraine, unspecified, not intractable, without status migrainosus: Secondary | ICD-10-CM | POA: Diagnosis not present

## 2014-08-13 DIAGNOSIS — R55 Syncope and collapse: Secondary | ICD-10-CM | POA: Diagnosis present

## 2014-08-13 DIAGNOSIS — Z8701 Personal history of pneumonia (recurrent): Secondary | ICD-10-CM | POA: Diagnosis not present

## 2014-08-13 DIAGNOSIS — M1990 Unspecified osteoarthritis, unspecified site: Secondary | ICD-10-CM | POA: Insufficient documentation

## 2014-08-13 DIAGNOSIS — Z853 Personal history of malignant neoplasm of breast: Secondary | ICD-10-CM | POA: Insufficient documentation

## 2014-08-13 DIAGNOSIS — E1142 Type 2 diabetes mellitus with diabetic polyneuropathy: Secondary | ICD-10-CM | POA: Insufficient documentation

## 2014-08-13 DIAGNOSIS — N63 Unspecified lump in breast: Secondary | ICD-10-CM | POA: Diagnosis not present

## 2014-08-13 DIAGNOSIS — J45909 Unspecified asthma, uncomplicated: Secondary | ICD-10-CM | POA: Diagnosis not present

## 2014-08-13 DIAGNOSIS — F209 Schizophrenia, unspecified: Secondary | ICD-10-CM | POA: Diagnosis not present

## 2014-08-13 DIAGNOSIS — Z923 Personal history of irradiation: Secondary | ICD-10-CM

## 2014-08-13 DIAGNOSIS — R531 Weakness: Principal | ICD-10-CM | POA: Insufficient documentation

## 2014-08-13 DIAGNOSIS — Y9389 Activity, other specified: Secondary | ICD-10-CM | POA: Diagnosis not present

## 2014-08-13 DIAGNOSIS — Z791 Long term (current) use of non-steroidal anti-inflammatories (NSAID): Secondary | ICD-10-CM | POA: Diagnosis not present

## 2014-08-13 DIAGNOSIS — M25561 Pain in right knee: Secondary | ICD-10-CM | POA: Insufficient documentation

## 2014-08-13 DIAGNOSIS — I129 Hypertensive chronic kidney disease with stage 1 through stage 4 chronic kidney disease, or unspecified chronic kidney disease: Secondary | ICD-10-CM | POA: Diagnosis not present

## 2014-08-13 DIAGNOSIS — E079 Disorder of thyroid, unspecified: Secondary | ICD-10-CM | POA: Insufficient documentation

## 2014-08-13 DIAGNOSIS — M549 Dorsalgia, unspecified: Secondary | ICD-10-CM

## 2014-08-13 DIAGNOSIS — Y9289 Other specified places as the place of occurrence of the external cause: Secondary | ICD-10-CM | POA: Diagnosis not present

## 2014-08-13 DIAGNOSIS — E785 Hyperlipidemia, unspecified: Secondary | ICD-10-CM | POA: Diagnosis not present

## 2014-08-13 DIAGNOSIS — K859 Acute pancreatitis, unspecified: Secondary | ICD-10-CM | POA: Diagnosis not present

## 2014-08-13 DIAGNOSIS — Y998 Other external cause status: Secondary | ICD-10-CM | POA: Insufficient documentation

## 2014-08-13 DIAGNOSIS — H8110 Benign paroxysmal vertigo, unspecified ear: Secondary | ICD-10-CM | POA: Insufficient documentation

## 2014-08-13 DIAGNOSIS — W1839XA Other fall on same level, initial encounter: Secondary | ICD-10-CM | POA: Diagnosis not present

## 2014-08-13 DIAGNOSIS — M25562 Pain in left knee: Secondary | ICD-10-CM | POA: Insufficient documentation

## 2014-08-13 DIAGNOSIS — Z79899 Other long term (current) drug therapy: Secondary | ICD-10-CM | POA: Diagnosis not present

## 2014-08-13 DIAGNOSIS — G8929 Other chronic pain: Secondary | ICD-10-CM | POA: Diagnosis not present

## 2014-08-13 DIAGNOSIS — F419 Anxiety disorder, unspecified: Secondary | ICD-10-CM | POA: Insufficient documentation

## 2014-08-13 DIAGNOSIS — N200 Calculus of kidney: Secondary | ICD-10-CM | POA: Diagnosis not present

## 2014-08-13 DIAGNOSIS — R29898 Other symptoms and signs involving the musculoskeletal system: Secondary | ICD-10-CM

## 2014-08-13 DIAGNOSIS — I10 Essential (primary) hypertension: Secondary | ICD-10-CM | POA: Diagnosis present

## 2014-08-13 LAB — CBC
HCT: 26.7 % — ABNORMAL LOW (ref 36.0–46.0)
Hemoglobin: 8.9 g/dL — ABNORMAL LOW (ref 12.0–15.0)
MCH: 28.5 pg (ref 26.0–34.0)
MCHC: 33.3 g/dL (ref 30.0–36.0)
MCV: 85.6 fL (ref 78.0–100.0)
Platelets: 234 10*3/uL (ref 150–400)
RBC: 3.12 MIL/uL — ABNORMAL LOW (ref 3.87–5.11)
RDW: 14.9 % (ref 11.5–15.5)
WBC: 11.7 10*3/uL — ABNORMAL HIGH (ref 4.0–10.5)

## 2014-08-13 LAB — BASIC METABOLIC PANEL WITH GFR
Anion gap: 12 (ref 5–15)
BUN: 35 mg/dL — ABNORMAL HIGH (ref 6–20)
CO2: 22 mmol/L (ref 22–32)
Calcium: 8.4 mg/dL — ABNORMAL LOW (ref 8.9–10.3)
Chloride: 94 mmol/L — ABNORMAL LOW (ref 101–111)
Creatinine, Ser: 1.52 mg/dL — ABNORMAL HIGH (ref 0.44–1.00)
GFR calc Af Amer: 43 mL/min — ABNORMAL LOW
GFR calc non Af Amer: 37 mL/min — ABNORMAL LOW
Glucose, Bld: 103 mg/dL — ABNORMAL HIGH (ref 65–99)
Potassium: 4.6 mmol/L (ref 3.5–5.1)
Sodium: 128 mmol/L — ABNORMAL LOW (ref 135–145)

## 2014-08-13 MED ORDER — SODIUM CHLORIDE 0.9 % IV BOLUS (SEPSIS)
1000.0000 mL | Freq: Once | INTRAVENOUS | Status: AC
  Administered 2014-08-14: 1000 mL via INTRAVENOUS

## 2014-08-13 NOTE — ED Provider Notes (Signed)
CSN: [id number]     Arrival date & time 08/13/14  2154 History   First MD Initiated Contact with Patient 08/13/14 2203     Chief Complaint  Patient presents with  . Weakness  . Fall     (Consider location/radiation/quality/duration/timing/severity/associated sxs/prior Treatment) Patient is a 59 y.o. female presenting with weakness and fall.  Weakness This is a new problem. The current episode started in the past 7 days. The problem occurs constantly. The problem has been gradually worsening. Associated symptoms include anorexia, fatigue and weakness. Pertinent negatives include no abdominal pain, arthralgias, change in bowel habit, chest pain, chills, congestion, coughing, diaphoresis, fever, headaches, nausea, neck pain, numbness, rash, sore throat, urinary symptoms, vertigo, visual change or vomiting. Nothing aggravates the symptoms. She has tried nothing for the symptoms. The treatment provided no relief.  Fall Associated symptoms include anorexia, fatigue and weakness. Pertinent negatives include no abdominal pain, arthralgias, change in bowel habit, chest pain, chills, congestion, coughing, diaphoresis, fever, headaches, nausea, neck pain, numbness, rash, sore throat, urinary symptoms, vertigo, visual change or vomiting.    Past Medical History  Diagnosis Date  . Depression   . Thyroid disease   . GERD (gastroesophageal reflux disease)   . Pancreatitis     hx of  . Knee pain, bilateral   . Obesity   . HSV (herpes simplex virus) infection   . Hyperlipemia   . Asthma   . Tachycardia     with sx in 2008  . Breast mass in female     L breast 2008, US showed likely fat necrosis  . Anxiety   . Hypertension     sees Dr. Criss Rosales , [place]  . Hx of radiation therapy 05/05/12- 07/15/12    right breast, 45 gray x 25 fx, lumpectomy cavity boosted to 16.2 gray  . History of blood transfusion 2011    "after one of my OR's"  . Hypothyroidism   . 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  Polyneuropathy in diabetes(357.2)   . Benign paroxysmal positional vertigo 12/30/2012  . 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  . OSA on CPAP     pt does not know settings  . Pneumonia 1950's  . Anemia   . Type II diabetes mellitus   . History of hiatal hernia   . History of stomach ulcers   . Daily headache   . Migraines     "~ 3 times/month" (01/06/2014)  . Arthritis     "pretty much all my joints"  . Chronic lower back pain   . Schizophrenia   . Kidney stones   . Chronic kidney disease (CKD)     "lower stage" (01/06/2014)   Past Surgical History  Procedure Laterality Date  . Total knee arthroplasty Bilateral 2009-06/2009    left; right  . Foot fracture surgery Right 1990's  . Shoulder open rotator cuff repair Left 1990's  . Thyroidectomy  1970's  . Joint replacement    . Abdominal hysterectomy  1979?    partial  . Cholecystectomy  1980's?  . Colonoscopy with propofol N/A 09/28/2012    Procedure: COLONOSCOPY WITH PROPOFOL;  Surgeon: Lear Ng, MD;  Location: [facility];  Service: Endoscopy;  Laterality: N/A;  . Revision total knee arthroplasty  07/2009  . Breast surgery Right 01/2012    "cancer"  . Breast lumpectomy  03/03/2012    Procedure: LUMPECTOMY;  Surgeon: Haywood Lasso, MD;  Location:  [facility] OR;  Service: General;  Laterality: Right;   Family History  Problem Relation Age of Onset  . Heart disease Brother     Multiple MIs, starting in his 82s  . Diabetes Brother   . Hypertension Mother   . Diabetes Mother   . Breast cancer Mother 69  . Bone cancer Mother   . Hypertension Sister   . Diabetes Sister   . Breast cancer Sister 40  . Breast cancer Maternal Grandmother   . Heart disease Maternal Grandmother   . Uterine cancer Other 19  . Breast cancer Paternal Aunt 67  . Breast cancer Paternal Grandmother     dx in her 16s  . Prostate cancer Paternal Grandfather    History  Substance Use  Topics  . Smoking status: Former Smoker -- 1.00 packs/day for 7 years    Types: Cigarettes    Quit date: 03/24/1992  . Smokeless tobacco: Never Used  . Alcohol Use: Yes     Comment: "stopped drinking in 1996; I was an alcoholic"   OB History    No data available     Review of Systems  Constitutional: Positive for fatigue. Negative for fever, chills, diaphoresis and appetite change.  HENT: Negative for congestion, ear pain, facial swelling, mouth sores and sore throat.   Eyes: Negative for visual disturbance.  Respiratory: Negative for cough, chest tightness and shortness of breath.   Cardiovascular: Negative for chest pain and palpitations.  Gastrointestinal: Positive for anorexia. Negative for nausea, vomiting, abdominal pain, diarrhea, blood in stool and change in bowel habit.  Endocrine: Negative for cold intolerance and heat intolerance.  Genitourinary: Negative for frequency, decreased urine volume and difficulty urinating.  Musculoskeletal: Negative for back pain, arthralgias, neck pain and neck stiffness.  Skin: Negative for rash.  Neurological: Positive for weakness. Negative for dizziness, vertigo, light-headedness, numbness and headaches.  All other systems reviewed and are negative.     Allergies  Shellfish allergy; Bee venom; and Morphine and related  Home Medications   Prior to Admission medications   Medication Sig Start Date End Date Taking? Authorizing Provider  aspirin EC 81 MG tablet Take 1 tablet (81 mg total) by mouth daily. 08/12/13   Sheila Oats, MD  CALCIUM PO Take 1 tablet by mouth daily.     Historical Provider, MD  cholecalciferol (VITAMIN D) 1000 UNITS tablet Take 1 tablet (1,000 Units total) by mouth daily. 11/18/12   Chauncey Cruel, MD  diclofenac sodium (VOLTAREN) 1 % GEL Apply 2 g topically 2 (two) times daily.     Historical Provider, MD  esomeprazole (NEXIUM) 40 MG capsule Take 40 mg by mouth daily before breakfast.    Historical  Provider, MD  insulin aspart (NOVOLOG) 100 UNIT/ML injection Inject 2 Units into the skin 3 (three) times daily before meals. Sliding scale    Historical Provider, MD  Insulin Detemir (LEVEMIR FLEXPEN) 100 UNIT/ML Pen Inject 15 Units into the skin at bedtime.    Historical Provider, MD  levothyroxine (SYNTHROID, LEVOTHROID) 175 MCG tablet Take 175 mcg by mouth daily before breakfast. Pt takes this medication Tues, Wed, Thurs, Fri per MD orders.    Historical Provider, MD  lisinopril (PRINIVIL,ZESTRIL) 2.5 MG tablet Take 2.5 mg by mouth daily. 12/20/12   Historical Provider, MD  LORazepam (ATIVAN) 2 MG tablet Take 2 mg by mouth daily.    Historical Provider, MD  Multiple Vitamin (MULTIVITAMIN WITH MINERALS) TABS Take 1 tablet by mouth daily.    Historical  Provider, MD  oxyCODONE (ROXICODONE) 15 MG immediate release tablet Take 15 mg by mouth 5 (five) times daily.    Historical Provider, MD  pregabalin (LYRICA) 75 MG capsule Take 75 mg by mouth at bedtime. 2 tablets at bedtime    Historical Provider, MD  rosuvastatin (CRESTOR) 20 MG tablet Take 20 mg by mouth daily.    Historical Provider, MD  sitaGLIPtin-metformin (JANUMET) 50-500 MG per tablet Take 1 tablet by mouth 2 (two) times daily with a meal.    Historical Provider, MD  tapentadol (NUCYNTA) 50 MG TABS tablet Take 200 mg by mouth every 12 (twelve) hours.    Historical Provider, MD  valACYclovir (VALTREX) 1000 MG tablet Take 1,000 mg by mouth daily.    Historical Provider, MD  valsartan-hydrochlorothiazide (DIOVAN-HCT) 160-12.5 MG per tablet Take 1 tablet by mouth daily.  02/08/13   Historical Provider, MD   BP 95/51 mmHg  Pulse 84  Temp(Src) 98.4 F (36.9 C) (Oral)  Resp 10  Ht _0  (1.651 m)  Wt 283 lb (128.368 kg)  BMI 47.09 kg/m2  SpO2 97% Physical Exam  Constitutional: She is oriented to person, place, and time. She appears well-developed and well-nourished. No distress.  Morbidly obese  HENT:  Head: Normocephalic and atraumatic.   Right Ear: External ear normal.  Left Ear: External ear normal.  Nose: Nose normal.  Eyes: Conjunctivae and EOM are normal. Pupils are equal, round, and reactive to light. Right eye exhibits no discharge. Left eye exhibits no discharge. No scleral icterus.  Neck: Normal range of motion. Neck supple.  Cardiovascular: Normal rate, regular rhythm and normal heart sounds.  Exam reveals no gallop and no friction rub.   No murmur heard. Pulmonary/Chest: Effort normal and breath sounds normal. No stridor. No respiratory distress. She has no wheezes.  Abdominal: Soft. She exhibits no distension. There is no tenderness.  Musculoskeletal: She exhibits no edema or tenderness.  Neurological: She is alert and oriented to person, place, and time. She displays no tremor. No cranial nerve deficit or sensory deficit. She exhibits normal muscle tone. GCS eye subscore is 4. GCS verbal subscore is 5. GCS motor subscore is 6.  Reflex Scores:      Bicep reflexes are 1+ on the right side and 1+ on the left side.      Brachioradialis reflexes are 1+ on the right side and 1+ on the left side.      Patellar reflexes are 1+ on the right side and 1+ on the left side. Decreased bilateral lower extremity strength. Otherwise strength intact. No saddle anesthesia. Gait deferred.  Skin: Skin is warm and dry. No rash noted. She is not diaphoretic. No erythema.  Psychiatric: She has a normal mood and affect.    ED Course  Procedures (including critical care time) Labs Review Labs Reviewed  CBC - Abnormal; Notable for the following:    WBC 11.7 (*)    RBC 3.12 (*)    Hemoglobin 8.9 (*)    HCT 26.7 (*)    All other components within normal limits  BASIC METABOLIC PANEL - Abnormal; Notable for the following:    Sodium 128 (*)    Chloride 94 (*)    Glucose, Bld 103 (*)    BUN 35 (*)    Creatinine, Ser 1.52 (*)    Calcium 8.4 (*)    GFR calc non Af Amer 37 (*)    GFR calc Af Amer 43 (*)    All other components  within normal limits  URINALYSIS, ROUTINE W REFLEX MICROSCOPIC (NOT AT [facility])  POC OCCULT BLOOD, ED  TYPE AND SCREEN    Imaging Review No results found.   EKG Interpretation   Date/Time:  Sunday August 13 2014 22:02:02 EDT Ventricular Rate:  94 PR Interval:  158 QRS Duration: 78 QT Interval:  345 QTC Calculation: 431 R Axis:   -10 Text Interpretation:  Sinus rhythm No significant change since last  tracing Confirmed by Ashok Cordia  MD, Lennette Bihari ([id number]) on 08/13/2014 10:18:14 PM      MDM   59 year old female with extensive past medical history including breast cancer status post lumpectomy and radiation who did not tolerate chemotherapy presents with 2 days of worsening bilateral lower extremity weakness resulting in a fall today. No LOC or head trauma. Patient landed on her buttocks. She has a history of chronic lower back pain and lumbar disc herniation who gets steroid shots. Rest of the history and exam as above. Patient with hypotension however still mentating well. IV fluids given and patient's pressure responded. Patient denied infectious symptoms however will obtain chest x-ray, lactic acid, urinalysis to assess for possible infection. EKG with normal sinus rhythm, intervals, and axis. No evidence of acute arrhythmia, ischemia, or blocks. CBC with a troponin I hemoglobin of 1-1/2 points. Patient denied any known hematochezia or melena however will obtain Hemoccult to assess for occult bleed. BMP with hyponatremia and hypochloremia. Renal insufficiency slightly worsened.   Pt care transferred to Dr. Randal Buba. Please her note for further ED course details.  Seen in conjunction with Dr. Ashok Cordia.  Final diagnoses:  Lower extremity weakness        Addison Lank, MD 08/14/14 [id number]  Lajean Saver, MD 08/14/14 1118

## 2014-08-13 NOTE — ED Notes (Signed)
Pt comes from home via [facility], increased weakness since about Friday, had fall in bathroom today, fell onto bottom, did not hit head, no LOC, no c/o, neck pain, sacral back pain is chronic but worse today after fall.

## 2014-08-14 ENCOUNTER — Emergency Department (HOSPITAL_COMMUNITY): Payer: Medicare Other

## 2014-08-14 ENCOUNTER — Inpatient Hospital Stay (HOSPITAL_COMMUNITY): Payer: Medicare Other

## 2014-08-14 DIAGNOSIS — R296 Repeated falls: Secondary | ICD-10-CM | POA: Diagnosis not present

## 2014-08-14 DIAGNOSIS — K219 Gastro-esophageal reflux disease without esophagitis: Secondary | ICD-10-CM | POA: Diagnosis not present

## 2014-08-14 DIAGNOSIS — W19XXXA Unspecified fall, initial encounter: Secondary | ICD-10-CM | POA: Insufficient documentation

## 2014-08-14 DIAGNOSIS — C50411 Malignant neoplasm of upper-outer quadrant of right female breast: Secondary | ICD-10-CM

## 2014-08-14 DIAGNOSIS — M549 Dorsalgia, unspecified: Secondary | ICD-10-CM | POA: Diagnosis not present

## 2014-08-14 DIAGNOSIS — E039 Hypothyroidism, unspecified: Secondary | ICD-10-CM

## 2014-08-14 DIAGNOSIS — R55 Syncope and collapse: Secondary | ICD-10-CM

## 2014-08-14 DIAGNOSIS — Z923 Personal history of irradiation: Secondary | ICD-10-CM

## 2014-08-14 LAB — RAPID URINE DRUG SCREEN, HOSP PERFORMED
Amphetamines: NOT DETECTED
Barbiturates: NOT DETECTED
Benzodiazepines: NOT DETECTED
Cocaine: NOT DETECTED
Opiates: POSITIVE — AB
TETRAHYDROCANNABINOL: NOT DETECTED

## 2014-08-14 LAB — CBC WITH DIFFERENTIAL/PLATELET
Basophils Absolute: 0 10*3/uL (ref 0.0–0.1)
Basophils Relative: 0 % (ref 0–1)
EOS ABS: 0.3 10*3/uL (ref 0.0–0.7)
Eosinophils Relative: 3 % (ref 0–5)
HCT: 29.4 % — ABNORMAL LOW (ref 36.0–46.0)
Hemoglobin: 9.7 g/dL — ABNORMAL LOW (ref 12.0–15.0)
LYMPHS ABS: 2.3 10*3/uL (ref 0.7–4.0)
Lymphocytes Relative: 22 % (ref 12–46)
MCH: 28.1 pg (ref 26.0–34.0)
MCHC: 33 g/dL (ref 30.0–36.0)
MCV: 85.2 fL (ref 78.0–100.0)
Monocytes Absolute: 0.4 10*3/uL (ref 0.1–1.0)
Monocytes Relative: 3 % (ref 3–12)
NEUTROS PCT: 72 % (ref 43–77)
Neutro Abs: 7.6 10*3/uL (ref 1.7–7.7)
PLATELETS: 270 10*3/uL (ref 150–400)
RBC: 3.45 MIL/uL — AB (ref 3.87–5.11)
RDW: 14.7 % (ref 11.5–15.5)
WBC: 10.5 10*3/uL (ref 4.0–10.5)

## 2014-08-14 LAB — COMPREHENSIVE METABOLIC PANEL
ALK PHOS: 86 U/L (ref 38–126)
ALT: 21 U/L (ref 14–54)
ANION GAP: 8 (ref 5–15)
AST: 21 U/L (ref 15–41)
Albumin: 3 g/dL — ABNORMAL LOW (ref 3.5–5.0)
BUN: 25 mg/dL — ABNORMAL HIGH (ref 6–20)
CO2: 25 mmol/L (ref 22–32)
Calcium: 9.2 mg/dL (ref 8.9–10.3)
Chloride: 103 mmol/L (ref 101–111)
Creatinine, Ser: 1.34 mg/dL — ABNORMAL HIGH (ref 0.44–1.00)
GFR calc non Af Amer: 43 mL/min — ABNORMAL LOW (ref >60)
GFR, EST AFRICAN AMERICAN: 50 mL/min — AB (ref >60)
Glucose, Bld: 100 mg/dL — ABNORMAL HIGH (ref 65–99)
POTASSIUM: 4.6 mmol/L (ref 3.5–5.1)
SODIUM: 136 mmol/L (ref 135–145)
TOTAL PROTEIN: 6.1 g/dL — AB (ref 6.5–8.1)
Total Bilirubin: 0.2 mg/dL — ABNORMAL LOW (ref 0.3–1.2)

## 2014-08-14 LAB — POC OCCULT BLOOD, ED: Fecal Occult Bld: NEGATIVE

## 2014-08-14 LAB — TYPE AND SCREEN
ABO/RH(D): O POS
Antibody Screen: NEGATIVE

## 2014-08-14 LAB — TSH: TSH: 0.811 u[IU]/mL (ref 0.350–4.500)

## 2014-08-14 LAB — CBG MONITORING, ED: Glucose-Capillary: 91 mg/dL (ref 65–99)

## 2014-08-14 LAB — URINALYSIS, ROUTINE W REFLEX MICROSCOPIC
BILIRUBIN URINE: NEGATIVE
GLUCOSE, UA: NEGATIVE mg/dL
Ketones, ur: NEGATIVE mg/dL
Nitrite: NEGATIVE
PROTEIN: NEGATIVE mg/dL
Specific Gravity, Urine: 1.006 (ref 1.005–1.030)
Urobilinogen, UA: 0.2 mg/dL (ref 0.0–1.0)
pH: 5.5 (ref 5.0–8.0)

## 2014-08-14 LAB — URINE MICROSCOPIC-ADD ON

## 2014-08-14 LAB — GLUCOSE, CAPILLARY
Glucose-Capillary: 150 mg/dL — ABNORMAL HIGH (ref 65–99)
Glucose-Capillary: 104 mg/dL — ABNORMAL HIGH (ref 65–99)
Glucose-Capillary: 148 mg/dL — ABNORMAL HIGH (ref 65–99)

## 2014-08-14 LAB — PROTIME-INR
INR: 0.98 (ref 0.00–1.49)
PROTHROMBIN TIME: 13.2 s (ref 11.6–15.2)

## 2014-08-14 LAB — ETHANOL: Alcohol, Ethyl (B): <5 mg/dL (ref <5)

## 2014-08-14 LAB — OSMOLALITY: OSMOLALITY: 295 mosm/kg (ref 275–300)

## 2014-08-14 MED ORDER — ONDANSETRON HCL 4 MG/2ML IJ SOLN
4.0000 mg | Freq: Four times a day (QID) | INTRAMUSCULAR | Status: DC | PRN
  Administered 2014-08-14: 4 mg via INTRAVENOUS
  Filled 2014-08-14: qty 2

## 2014-08-14 MED ORDER — INSULIN ASPART 100 UNIT/ML ~~LOC~~ SOLN: 0.0000 [IU] | Freq: Every day | SUBCUTANEOUS | Status: DC

## 2014-08-14 MED ORDER — PREGABALIN 50 MG PO CAPS
75.0000 mg | ORAL_CAPSULE | Freq: Every day | ORAL | Status: DC
  Administered 2014-08-14 – 2014-08-15 (×2): 75 mg via ORAL
  Filled 2014-08-14 (×4): qty 1

## 2014-08-14 MED ORDER — HEPARIN SODIUM (PORCINE) 5000 UNIT/ML IJ SOLN
5000.0000 [IU] | Freq: Three times a day (TID) | INTRAMUSCULAR | Status: DC
  Administered 2014-08-14 – 2014-08-16 (×7): 5000 [IU] via SUBCUTANEOUS
  Filled 2014-08-14 (×7): qty 1

## 2014-08-14 MED ORDER — ROSUVASTATIN CALCIUM 20 MG PO TABS
20.0000 mg | ORAL_TABLET | Freq: Every day | ORAL | Status: DC
  Administered 2014-08-14 – 2014-08-16 (×3): 20 mg via ORAL
  Filled 2014-08-14 (×3): qty 2

## 2014-08-14 MED ORDER — ACETAMINOPHEN 650 MG RE SUPP: 650.0000 mg | Freq: Four times a day (QID) | RECTAL | Status: DC | PRN

## 2014-08-14 MED ORDER — LIDOCAINE VISCOUS 2 % MT SOLN
15.0000 mL | Freq: Four times a day (QID) | OROMUCOSAL | Status: DC | PRN
  Administered 2014-08-14 – 2014-08-16 (×3): 15 mL via OROMUCOSAL
  Filled 2014-08-14 (×5): qty 15

## 2014-08-14 MED ORDER — SODIUM CHLORIDE 0.9 % IJ SOLN
3.0000 mL | Freq: Two times a day (BID) | INTRAMUSCULAR | Status: DC
  Administered 2014-08-14 – 2014-08-15 (×3): 3 mL via INTRAVENOUS

## 2014-08-14 MED ORDER — OXYCODONE HCL 5 MG PO TABS
15.0000 mg | ORAL_TABLET | ORAL | Status: DC | PRN
  Administered 2014-08-14 – 2014-08-16 (×11): 15 mg via ORAL
  Filled 2014-08-14 (×11): qty 3

## 2014-08-14 MED ORDER — VALACYCLOVIR HCL 500 MG PO TABS
1000.0000 mg | ORAL_TABLET | Freq: Every day | ORAL | Status: DC
  Administered 2014-08-14 – 2014-08-16 (×3): 1000 mg via ORAL
  Filled 2014-08-14 (×3): qty 2

## 2014-08-14 MED ORDER — ONDANSETRON HCL 4 MG PO TABS: 4.0000 mg | ORAL_TABLET | Freq: Four times a day (QID) | ORAL | Status: DC | PRN

## 2014-08-14 MED ORDER — CALCIUM CARBONATE ANTACID 500 MG PO CHEW
3.0000 | CHEWABLE_TABLET | Freq: Once | ORAL | Status: AC
Start: 2014-08-14 — End: 2014-08-14
  Administered 2014-08-14: 600 mg via ORAL
  Filled 2014-08-14: qty 3

## 2014-08-14 MED ORDER — SODIUM CHLORIDE 0.9 % IV SOLN: INTRAVENOUS | Status: DC

## 2014-08-14 MED ORDER — GADOBENATE DIMEGLUMINE 529 MG/ML IV SOLN
10.0000 mL | Freq: Once | INTRAVENOUS | Status: AC | PRN
  Administered 2014-08-14: 10 mL via INTRAVENOUS

## 2014-08-14 MED ORDER — INSULIN ASPART 100 UNIT/ML ~~LOC~~ SOLN
0.0000 [IU] | Freq: Three times a day (TID) | SUBCUTANEOUS | Status: DC
  Administered 2014-08-14: 2 [IU] via SUBCUTANEOUS

## 2014-08-14 MED ORDER — ACETAMINOPHEN 325 MG PO TABS: 650.0000 mg | ORAL_TABLET | Freq: Four times a day (QID) | ORAL | Status: DC | PRN

## 2014-08-14 MED ORDER — TAPENTADOL HCL 50 MG PO TABS
200.0000 mg | ORAL_TABLET | Freq: Two times a day (BID) | ORAL | Status: DC
  Administered 2014-08-14 – 2014-08-16 (×5): 200 mg via ORAL
  Filled 2014-08-14 (×5): qty 4

## 2014-08-14 MED ORDER — PANTOPRAZOLE SODIUM 40 MG PO TBEC
40.0000 mg | DELAYED_RELEASE_TABLET | Freq: Every day | ORAL | Status: DC
  Administered 2014-08-14 – 2014-08-15 (×2): 40 mg via ORAL
  Filled 2014-08-14 (×3): qty 1

## 2014-08-14 MED ORDER — OXYCODONE HCL 5 MG PO TABS
15.0000 mg | ORAL_TABLET | Freq: Four times a day (QID) | ORAL | Status: DC | PRN
  Administered 2014-08-14: 15 mg via ORAL
  Filled 2014-08-14: qty 3

## 2014-08-14 MED ORDER — LEVOTHYROXINE SODIUM 175 MCG PO TABS
175.0000 ug | ORAL_TABLET | Freq: Every day | ORAL | Status: DC
  Administered 2014-08-14 – 2014-08-16 (×3): 175 ug via ORAL
  Filled 2014-08-14 (×4): qty 1

## 2014-08-14 MED ORDER — KETOROLAC TROMETHAMINE 30 MG/ML IJ SOLN
30.0000 mg | Freq: Once | INTRAMUSCULAR | Status: AC
  Administered 2014-08-14: 30 mg via INTRAVENOUS
  Filled 2014-08-14: qty 1

## 2014-08-14 MED ORDER — ASPIRIN EC 81 MG PO TBEC
81.0000 mg | DELAYED_RELEASE_TABLET | Freq: Every day | ORAL | Status: DC
  Administered 2014-08-14 – 2014-08-16 (×3): 81 mg via ORAL
  Filled 2014-08-14 (×3): qty 1

## 2014-08-14 MED ORDER — SODIUM CHLORIDE 0.9 % IV SOLN
INTRAVENOUS | Status: AC
  Administered 2014-08-14: 08:00:00 via INTRAVENOUS

## 2014-08-14 NOTE — Evaluation (Signed)
Physical Therapy Evaluation Patient Details Name: [name] MRN: [id number] DOB: 1955-10-02 Today's Date: 08/14/2014   History of Present Illness  [name] is a 59 y.o. female with PMH of morbid obesity, essential HTN, hypothyroidism, GERD, herpes simplex 1 infection, anxiety, chronic back pain. She presents with a fall and inability to get up and reports at least 5 falls since 3/16 saying that her legs "just give way". She denies any dizziness or lightheadedness. Pt with diminished respiratory rate upon arrival and decline in renal status.  Clinical Impression  Pt admitted with above diagnosis. Pt currently with functional limitations due to the deficits listed below (see PT Problem List). Pt extremely limited by pain today, ambulated 3' fwd and 3' back with +2 min A and RW. All mvmt aggravated back pain. Likely that back pain has caused consistent decline in pt mobility status at home which has further weakened bilateral knees, increasing falls.  Pt will benefit from skilled PT to increase their independence and safety with mobility to allow discharge to the venue listed below.       Follow Up Recommendations SNF    (this recommendation based on best practice for safety of pt. However, pt already reports that she will refuse this option due to bad experience in the past. If this is the case, recommend [facility] and [facility]. )   Equipment Recommendations  None recommended by PT    Recommendations for Other Services       Precautions / Restrictions Precautions Precautions: Fall Restrictions Weight Bearing Restrictions: No      Mobility  Bed Mobility Overal bed mobility: Needs Assistance Bed Mobility: Sit to Supine       Sit to supine: Supervision   General bed mobility comments: pt guarded with back to bed as pain was significant and pt crying out, but pt was able to get both legs back into bed one at a time and lie down. Increased time needed  Transfers Overall  transfer level: Needs assistance Equipment used: Rolling walker (2 wheeled) Transfers: Sit to/from Stand Sit to Stand: +2 physical assistance;Min assist         General transfer comment: pt with increased pain with sit to stand, min A +2 for support and power up. Pt stood to RW with difficulty transferring hands from front of RW to sides. RW stabilized as pt had to push on it to achieve standing   Ambulation/Gait Ambulation/Gait assistance: +2 physical assistance;Min guard Ambulation Distance (Feet): 3 Feet Assistive device: Rolling walker (2 wheeled) Gait Pattern/deviations: Step-to pattern;Trunk flexed Gait velocity: very slow and labored Gait velocity interpretation: <1.8 ft/sec, indicative of risk for recurrent falls General Gait Details: ambulated 3' fwd and 3' back with difficulty taking each step, trunk flexed (she reported that if she lifted her trunk anymore, back would hurt too much to stand)  Stairs            Wheelchair Mobility    Modified Rankin (Stroke Patients Only)       Balance Overall balance assessment: Needs assistance Sitting-balance support: Bilateral upper extremity supported;Feet supported Sitting balance-Leahy Scale: Fair Sitting balance - Comments: can maintain sitting without UE support but only for a few seconds due to increased pain   Standing balance support: Bilateral upper extremity supported Standing balance-Leahy Scale: Poor Standing balance comment: unable to stand without UE support  Pertinent Vitals/Pain Pain Assessment: 0-10 Pain Score: 10-Worst pain ever Pain Location: low back Pain Descriptors / Indicators: Aching;Burning;Constant Pain Intervention(s): RN gave pain meds during session;Monitored during session;Limited activity within patient's tolerance    Home Living Family/patient expects to be discharged to:: Private residence Living Arrangements: Alone Available Help at Discharge:  Family;Available PRN/intermittently Type of Home: House Home Access: Stairs to enter Entrance Stairs-Rails: None Entrance Stairs-Number of Steps: 1 Home Layout: One level Home Equipment: Cane - single point;Walker - 2 wheels;Shower seat Additional Comments: Family comes by 1 q 3 weeks    Prior Function Level of Independence: Independent with assistive device(s)         Comments: used RW or cane, daughter or son brought her groceries. She reports that she doesn't have anyone else     Hand Dominance   Dominant Hand: Right    Extremity/Trunk Assessment   Upper Extremity Assessment: Defer to OT evaluation       LUE Deficits / Details: RTC repair @ 20 years ago. difficulty raining "it up"   Lower Extremity Assessment: Generalized weakness;RLE deficits/detail;LLE deficits/detail RLE Deficits / Details: hip flexion >3/5 bilaterally, knee flex/ extension 3/5 bilaterally with discomfort. Pt reports that she has been given LE strengthening exercises in the past but was limited in performing them because they increased her back pain LLE Deficits / Details: see RLE note  Cervical / Trunk Assessment: Kyphotic  Communication   Communication: No difficulties  Cognition Arousal/Alertness: Lethargic Behavior During Therapy: Flat affect Overall Cognitive Status: Within Functional Limits for tasks assessed                      General Comments General comments (skin integrity, edema, etc.): VSS throughout    Exercises Other Exercises Other Exercises: pt could not tolerate LE exercises today but discussed heel slides, pelvic tilts, and maintaining bridge position for abdominal activation and gentle stretch to low back      Assessment/Plan    PT Assessment Patient needs continued PT services  PT Diagnosis Difficulty walking;Acute pain;Generalized weakness;Abnormality of gait   PT Problem List Decreased strength;Decreased activity tolerance;Decreased balance;Decreased  mobility;Pain;Obesity;Decreased knowledge of precautions  PT Treatment Interventions DME instruction;Gait training;Stair training;Functional mobility training;Therapeutic activities;Therapeutic exercise;Balance training;Patient/family education;Neuromuscular re-education   PT Goals (Current goals can be found in the Care Plan section) Acute Rehab PT Goals Patient Stated Goal: pt does not want to go to SNF, reports she has been before and it does not help. Wants to go home PT Goal Formulation: With patient Time For Goal Achievement: 08/28/14 Potential to Achieve Goals: Fair    Frequency Min 3X/week   Barriers to discharge Decreased caregiver support very minimal help at home    Co-evaluation PT/OT/SLP Co-Evaluation/Treatment: Yes Reason for Co-Treatment: For patient/therapist safety;Complexity of the patient's impairments (multi-system involvement)   OT goals addressed during session: ADL's and self-care       End of Session Equipment Utilized During Treatment: Gait belt Activity Tolerance: Patient limited by pain Patient left: in bed;with call bell/phone within reach Nurse Communication: Mobility status    Functional Assessment Tool Used: clinical judgement Functional Limitation: Mobility: Walking and moving around Mobility: Walking and Moving Around Current Status (A5409): At least 20 percent but less than 40 percent impaired, limited or restricted Mobility: Walking and Moving Around Goal Status [phone]): At least 1 percent but less than 20 percent impaired, limited or restricted    Time: 1310-1336 PT Time Calculation (min) (ACUTE ONLY): 26 min   Charges:  PT Evaluation $Initial PT Evaluation Tier I: 1 Procedure     PT G Codes:   PT G-Codes **NOT FOR INPATIENT CLASS** Functional Assessment Tool Used: clinical judgement Functional Limitation: Mobility: Walking and moving around Mobility: Walking and Moving Around Current Status (L4103): At least 20 percent but less than  40 percent impaired, limited or restricted Mobility: Walking and Moving Around Goal Status [phone]): At least 1 percent but less than 20 percent impaired, limited or restricted  Leighton Roach, [place]  [place], [place] 08/14/2014, 2:10 PM

## 2014-08-14 NOTE — ED Notes (Signed)
PT unable to stand due to pain unable to do ortho static B/P's @ this time.

## 2014-08-14 NOTE — Progress Notes (Signed)
Occupational Therapy Evaluation Patient Details Name: [name] MRN: [id number] DOB: [birth year] Today's Date: 08/14/2014    History of Present Illness EMALI HEYWARD is a 59 y.o. female with PMH of morbid obesity, essential HTN, hypothyroidism, GERD, herpes simplex 1 infection, anxiety, chronic back pain. She presents with a fall and inability to get up and reports at least 5 falls since 3/16 saying that her legs "just give way". She denies any dizziness or lightheadedness. Pt with diminished respiratory rate upon arrival and decline in renal status.   Clinical Impression   PTA, pt lived alone and was mod i with mobility and ADL with AD. Pt presents with increased complaints of pain and functional decline. Recommend that pt D/C to SNF for rehab as pt has had 5 falls since March, requires assistance with ADL and has limited support. Pt refuses to D/C to SNF therefore will need to D/C home with [facility] and home health aide. Will follow acutely to maximize functional level of independence with ADL and mobility with necessary DME and AE to facilitate safe D/C home.     Follow Up Recommendations  SNF;Home health OT    Equipment Recommendations  Other (comment) (adaptive equipment)    Recommendations for Other Services       Precautions / Restrictions Precautions Precautions: Fall Restrictions Weight Bearing Restrictions: No      Mobility Bed Mobility Overal bed mobility: Needs Assistance Bed Mobility: Sit to Supine     Supine to sit: Supervision Sit to supine: Supervision   General bed mobility comments: pt guarded with back to bed as pain was significant and pt crying out, but pt was able to get both legs back into bed one at a time and lie down. Increased time needed  Transfers Overall transfer level: Needs assistance Equipment used: Rolling walker (2 wheeled) Transfers: Sit to/from Stand Sit to Stand: +2 physical assistance;Min assist         General transfer  comment: pt with increased pain with sit to stand, min A +2 for support and power up. Pt stood to RW with difficulty transferring hands from front of RW to sides. RW stabilized as pt had to push on it to achieve standing     Balance Overall balance assessment: Needs assistance Sitting-balance support: Bilateral upper extremity supported;Feet supported Sitting balance-Leahy Scale: Fair Sitting balance - Comments: can maintain sitting without UE support but only for a few seconds due to increased pain   Standing balance support: Bilateral upper extremity supported Standing balance-Leahy Scale: Poor Standing balance comment: unable to stand without UE support                            ADL Overall ADL's : Needs assistance/impaired Eating/Feeding: Independent   Grooming: Set up   Upper Body Bathing: Set up;Sitting   Lower Body Bathing: Moderate assistance;Sit to/from stand   Upper Body Dressing : Minimal assistance   Lower Body Dressing: Moderate assistance;Sit to/from stand   Toilet Transfer: +2 for physical assistance;Minimal assistance   Toileting- Clothing Manipulation and Hygiene: Sit to/from stand;Maximal assistance       Functional mobility during ADLs: Minimal assistance;+2 for physical assistance;Rolling walker General ADL Comments: Pt reports her pain is worse at this time, but that it was hard for her to do her self care PTA. Pt has a Secondary school teacher. Would benefit from AE for LB ADL and toileting. States family comes by occasionally to get her groceries. Does  not drive. Could only stand and take 3 steps forward/backwards. Could not sit due to back pain. States she spends the majority of her time in the bed.                      Pertinent Vitals/Pain Pain Assessment: 0-10 Pain Score: 10-Worst pain ever Pain Location: low back Pain Descriptors / Indicators: Aching;Burning;Constant Pain Intervention(s): RN gave pain meds during session;Monitored during  session;Limited activity within patient's tolerance     Hand Dominance Right   Extremity/Trunk Assessment Upper Extremity Assessment Upper Extremity Assessment: Defer to OT evaluation LUE Deficits / Details: RTC repair @ 20 years ago. difficulty raining "it up"   Lower Extremity Assessment Lower Extremity Assessment: Generalized weakness;RLE deficits/detail;LLE deficits/detail RLE Deficits / Details: hip flexion >3/5 bilaterally, knee flex/ extension 3/5 bilaterally with discomfort. Pt reports that she has been given LE strengthening exercises in the past but was limited in performing them because they increased her back pain RLE: Unable to fully assess due to pain LLE Deficits / Details: see RLE note LLE: Unable to fully assess due to pain   Cervical / Trunk Assessment Cervical / Trunk Assessment: Kyphotic   Communication Communication Communication: No difficulties   Cognition Arousal/Alertness: Lethargic Behavior During Therapy: Flat affect Overall Cognitive Status: Within Functional Limits for tasks assessed                                    Home Living Family/patient expects to be discharged to:: Private residence Living Arrangements: Alone Available Help at Discharge: Family;Available PRN/intermittently Type of Home: House Home Access: Stairs to enter [employer] of Steps: 1 Entrance Stairs-Rails: None Home Layout: One level     Bathroom Shower/Tub: Walk-in Hydrologist: Standard Bathroom Accessibility: Yes How Accessible: Accessible via walker (if turned sideways) Home Equipment: Cane - single point;Walker - 2 wheels;Shower seat   Additional Comments: Family comes by 1 q 3 weeks      Prior Functioning/Environment Level of Independence: Independent with assistive device(s)        Comments: used RW or cane, daughter or son brought her groceries. She reports that she doesn't have anyone else    OT Diagnosis:  Generalized weakness;Acute pain   OT Problem List: Decreased strength;Decreased range of motion;Impaired balance (sitting and/or standing);Decreased activity tolerance;Decreased knowledge of use of DME or AE;Obesity;Impaired UE functional use;Pain   OT Treatment/Interventions: Self-care/ADL training;Energy conservation;DME and/or AE instruction;Therapeutic activities;Patient/family education;Balance training    OT Goals(Current goals can be found in the care plan section) Acute Rehab OT Goals Patient Stated Goal: pt does not want to go to SNF, reports she has been before and it does not help. Wants to go home OT Goal Formulation: With patient Time For Goal Achievement: 08/28/14 Potential to Achieve Goals: Good ADL Goals Pt Will Perform Lower Body Bathing: with set-up;with supervision;with adaptive equipment;sit to/from stand Pt Will Perform Lower Body Dressing: with set-up;with supervision;with adaptive equipment;sit to/from stand Pt Will Transfer to Toilet: with supervision;bedside commode;stand pivot transfer Pt Will Perform Toileting - Clothing Manipulation and hygiene: with supervision;with adaptive equipment;sitting/lateral leans  OT Frequency: Min 3X/week   Barriers to D/C: Decreased caregiver support          Co-evaluation PT/OT/SLP Co-Evaluation/Treatment: Yes Reason for Co-Treatment: For patient/therapist safety;Complexity of the patient's impairments (multi-system involvement)   OT goals addressed during session: ADL's and self-care  End of Session Equipment Utilized During Treatment: Gait belt;Rolling walker Nurse Communication: Mobility status  Activity Tolerance: No increased pain Patient left: in bed;with call bell/phone within reach   Time: 1257-1336 OT Time Calculation (min): 39 min Charges:  OT General Charges $OT Visit: 1 Procedure OT Evaluation $Initial OT Evaluation Tier I: 1 Procedure OT Treatments $Self Care/Home Management : 8-22 mins G-Codes:  OT G-codes **NOT FOR INPATIENT CLASS** Functional Assessment Tool Used: clinical judgement Functional Limitation: Self care Self Care Current Status (E7076): At least 60 percent but less than 80 percent impaired, limited or restricted Self Care Goal Status (J5183): At least 20 percent but less than 40 percent impaired, limited or restricted  Zahava Quant,HILLARY 08/14/2014, 2:15 PM   [facility], OTR/L  [phone] 08/14/2014

## 2014-08-14 NOTE — ED Notes (Signed)
CBG 91

## 2014-08-14 NOTE — ED Notes (Signed)
Patient transported to MRI 

## 2014-08-14 NOTE — Care Management Note (Signed)
Case Management Note  Patient Details  Name: WILLADENE MOUNSEY MRN: [id number] Date of Birth: 1955-08-10  Subjective/Objective: Pt admitted for recurrent falls. Pt is from home.                     Action/Plan: PT recommendations for SNF. CSW did speak with pt and pt refusing SNF. Best option at this time would be SNF due to falls, however Pt would like home with Home Health Services. CM did make referral with [facility] for services. SOC to begin within 24-48 hrs post d/c. MD please write orders for services: listed above and F2F. Pt has DME Kasandra Knudsen, Audiological scientist. No further needs from CM at this time.    Expected Discharge Date:                  Expected Discharge Plan:  Skilled Nursing Facility  In-House Referral:  Clinical Social Work  Discharge planning Services  CM Consult  Post Acute Care Choice:  NA Choice offered to:  Patient  DME Arranged:  N/A DME Agency:  NA  HH Arranged:  RN, PT, OT, Nurse's Aide (Education officer, museum) Kim:  [place]  Status of Service:  Completed, signed off  Medicare Important Message Given:    Date Medicare IM Given:    Medicare IM give by:    Date Additional Medicare IM Given:    Additional Medicare Important Message give by:     If discussed at [place] of Stay Meetings, dates discussed:    Additional Comments:  Bethena Roys, RN 08/14/2014, 2:48 PM

## 2014-08-14 NOTE — ED Provider Notes (Signed)
CSN: [id number]     Arrival date & time 08/13/14  2154 History   First MD Initiated Contact with Patient 08/13/14 2203     Chief Complaint  Patient presents with  . Weakness  . Fall     (Consider location/radiation/quality/duration/timing/severity/associated sxs/prior Treatment) Patient is a 59 y.o. female presenting with weakness and fall. The history is provided by the patient.  Weakness This is a new problem. The current episode started more than 1 week ago. The problem occurs constantly. The problem has not changed since onset.Pertinent negatives include no chest pain. Nothing aggravates the symptoms. Nothing relieves the symptoms. She has tried nothing for the symptoms. The treatment provided no relief.  Fall Pertinent negatives include no chest pain.    Past Medical History  Diagnosis Date  . Depression   . Thyroid disease   . GERD (gastroesophageal reflux disease)   . Pancreatitis     hx of  . Knee pain, bilateral   . Obesity   . HSV (herpes simplex virus) infection   . Hyperlipemia   . Asthma   . Tachycardia     with sx in 2008  . Breast mass in female     L breast 2008, US showed likely fat necrosis  . Anxiety   . Hypertension     sees Dr. Criss Rosales , [facility]  . Hx of radiation therapy 05/05/12- 07/15/12    right breast, 45 gray x 25 fx, lumpectomy cavity boosted to 16.2 gray  . History of blood transfusion 2011    "after one of my OR's"  . Hypothyroidism   . 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 Polyneuropathy in diabetes(357.2)   . Benign paroxysmal positional vertigo 12/30/2012  . 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  . OSA on CPAP     pt does not know settings  . Pneumonia 1950's  . Anemia   . Type II diabetes mellitus   . History of hiatal hernia   . History of stomach ulcers   . Daily headache   . Migraines     "~ 3 times/month" (01/06/2014)  . Arthritis     "pretty much all my joints"  .  Chronic lower back pain   . Schizophrenia   . Kidney stones   . Chronic kidney disease (CKD)     "lower stage" (01/06/2014)   Past Surgical History  Procedure Laterality Date  . Total knee arthroplasty Bilateral 2009-06/2009    left; right  . Foot fracture surgery Right 1990's  . Shoulder open rotator cuff repair Left 1990's  . Thyroidectomy  1970's  . Joint replacement    . Abdominal hysterectomy  1979?    partial  . Cholecystectomy  1980's?  . Colonoscopy with propofol N/A 09/28/2012    Procedure: COLONOSCOPY WITH PROPOFOL;  Surgeon: Lear Ng, MD;  Location: [facility] ENDOSCOPY;  Service: Endoscopy;  Laterality: N/A;  . Revision total knee arthroplasty  07/2009  . Breast surgery Right 01/2012    "cancer"  . Breast lumpectomy  03/03/2012    Procedure: LUMPECTOMY;  Surgeon: Haywood Lasso, MD;  Location: [facility] OR;  Service: General;  Laterality: Right;   Family History  Problem Relation Age of Onset  . Heart disease Brother     Multiple MIs, starting in his 24s  . Diabetes Brother   . Hypertension Mother   . Diabetes Mother   . Breast cancer Mother 95  . Bone  cancer Mother   . Hypertension Sister   . Diabetes Sister   . Breast cancer Sister 81  . Breast cancer Maternal Grandmother   . Heart disease Maternal Grandmother   . Uterine cancer Other 19  . Breast cancer Paternal Aunt 26  . Breast cancer Paternal Grandmother     dx in her 80s  . Prostate cancer Paternal Grandfather    History  Substance Use Topics  . Smoking status: Former Smoker -- 1.00 packs/day for 7 years    Types: Cigarettes    Quit date: 03/24/1992  . Smokeless tobacco: Never Used  . Alcohol Use: Yes     Comment: "stopped drinking in 1996; I was an alcoholic"   OB History    No data available     Review of Systems  Constitutional: Negative for fever.  Cardiovascular: Negative for chest pain.  Genitourinary: Negative for dysuria and difficulty urinating.  Neurological: Positive for  weakness.  All other systems reviewed and are negative.     Allergies  Shellfish allergy; Bee venom; and Morphine and related  Home Medications   Prior to Admission medications   Medication Sig Start Date End Date Taking? Authorizing Provider  aspirin EC 81 MG tablet Take 1 tablet (81 mg total) by mouth daily. 08/12/13   Sheila Oats, MD  CALCIUM PO Take 1 tablet by mouth daily.     Historical Provider, MD  cholecalciferol (VITAMIN D) 1000 UNITS tablet Take 1 tablet (1,000 Units total) by mouth daily. 11/18/12   Chauncey Cruel, MD  diclofenac sodium (VOLTAREN) 1 % GEL Apply 2 g topically 2 (two) times daily.     Historical Provider, MD  esomeprazole (NEXIUM) 40 MG capsule Take 40 mg by mouth daily before breakfast.    Historical Provider, MD  insulin aspart (NOVOLOG) 100 UNIT/ML injection Inject 2 Units into the skin 3 (three) times daily before meals. Sliding scale    Historical Provider, MD  Insulin Detemir (LEVEMIR FLEXPEN) 100 UNIT/ML Pen Inject 15 Units into the skin at bedtime.    Historical Provider, MD  levothyroxine (SYNTHROID, LEVOTHROID) 175 MCG tablet Take 175 mcg by mouth daily before breakfast. Pt takes this medication Tues, Wed, Thurs, Fri per MD orders.    Historical Provider, MD  lisinopril (PRINIVIL,ZESTRIL) 2.5 MG tablet Take 2.5 mg by mouth daily. 12/20/12   Historical Provider, MD  LORazepam (ATIVAN) 2 MG tablet Take 2 mg by mouth daily.    Historical Provider, MD  Multiple Vitamin (MULTIVITAMIN WITH MINERALS) TABS Take 1 tablet by mouth daily.    Historical Provider, MD  oxyCODONE (ROXICODONE) 15 MG immediate release tablet Take 15 mg by mouth 5 (five) times daily.    Historical Provider, MD  pregabalin (LYRICA) 75 MG capsule Take 75 mg by mouth at bedtime. 2 tablets at bedtime    Historical Provider, MD  rosuvastatin (CRESTOR) 20 MG tablet Take 20 mg by mouth daily.    Historical Provider, MD  sitaGLIPtin-metformin (JANUMET) 50-500 MG per tablet Take 1 tablet by  mouth 2 (two) times daily with a meal.    Historical Provider, MD  tapentadol (NUCYNTA) 50 MG TABS tablet Take 200 mg by mouth every 12 (twelve) hours.    Historical Provider, MD  valACYclovir (VALTREX) 1000 MG tablet Take 1,000 mg by mouth daily.    Historical Provider, MD  valsartan-hydrochlorothiazide (DIOVAN-HCT) 160-12.5 MG per tablet Take 1 tablet by mouth daily.  02/08/13   Historical Provider, MD   BP 102/55 mmHg  Pulse 85  Temp(Src) 98.4 F (36.9 C) (Oral)  Resp 13  Ht _0  (1.651 m)  Wt 283 lb (128.368 kg)  BMI 47.09 kg/m2  SpO2 98% Physical Exam  Constitutional: She is oriented to person, place, and time. She appears well-developed and well-nourished. No distress.  HENT:  Head: Normocephalic and atraumatic.  Mouth/Throat: Oropharynx is clear and moist.  Eyes: Conjunctivae are normal. Pupils are equal, round, and reactive to light.  Neck: Normal range of motion. Neck supple.  Cardiovascular: Normal rate, regular rhythm and intact distal pulses.   Pulmonary/Chest: Effort normal and breath sounds normal. No respiratory distress. She has no wheezes. She has no rales.  Abdominal: Soft. Bowel sounds are normal. There is no tenderness. There is no rebound and no guarding.  Musculoskeletal: Normal range of motion. She exhibits tenderness.  Neurological: She is alert and oriented to person, place, and time. She has normal reflexes. She displays normal reflexes. She exhibits normal muscle tone. Coordination normal.  Intact rectal tone intact perineal sensation 5-/5 BLE standing   Skin: Skin is warm and dry.  Psychiatric: She has a normal mood and affect.    ED Course  Procedures (including critical care time) Labs Review Labs Reviewed  CBC - Abnormal; Notable for the following:    WBC 11.7 (*)    RBC 3.12 (*)    Hemoglobin 8.9 (*)    HCT 26.7 (*)    All other components within normal limits  BASIC METABOLIC PANEL - Abnormal; Notable for the following:    Sodium 128 (*)     Chloride 94 (*)    Glucose, Bld 103 (*)    BUN 35 (*)    Creatinine, Ser 1.52 (*)    Calcium 8.4 (*)    GFR calc non Af Amer 37 (*)    GFR calc Af Amer 43 (*)    All other components within normal limits  URINALYSIS, ROUTINE W REFLEX MICROSCOPIC (NOT AT [facility]) - Abnormal; Notable for the following:    Hgb urine dipstick SMALL (*)    Leukocytes, UA TRACE (*)    All other components within normal limits  URINE MICROSCOPIC-ADD ON - Abnormal; Notable for the following:    Squamous Epithelial / LPF FEW (*)    All other components within normal limits  POC OCCULT BLOOD, ED  TYPE AND SCREEN    Imaging Review Dg Chest 2 View  08/14/2014   CLINICAL DATA:  Bilateral lower extremity weakness  EXAM: CHEST  2 VIEW  COMPARISON:  01/06/2014  FINDINGS: The heart size and mediastinal contours are within normal limits. Both lungs are clear. The visualized skeletal structures are unremarkable.  IMPRESSION: No active cardiopulmonary disease.   Electronically Signed   By: Andreas Newport M.D.   On: 08/14/2014 01:00   Dg Lumbar Spine Complete  08/14/2014   CLINICAL DATA:  Fall today with weakness. Lumbar and sacral back pain. Chronic pain, worsened after fall.  EXAM: LUMBAR SPINE - COMPLETE 4+ VIEW  COMPARISON:  Lumbar spine MRI 04/28/2013  FINDINGS: The alignment is maintained. Vertebral body heights are normal. There is no listhesis. The posterior elements are intact. No fracture. Minimal disc space narrowing at L3-L4 and L4-L5. Facet arthropathy at L3-L4, L4-L5, and to a lesser extent L5-S1. Sacroiliac joints are symmetric and normal.  IMPRESSION: Degenerative change in the lower lumbar spine without acute fracture or subluxation.   Electronically Signed   By: Jeb Levering M.D.   On: 08/14/2014 02:43  EKG Interpretation   Date/Time:  Sunday August 13 2014 22:02:02 EDT Ventricular Rate:  94 PR Interval:  158 QRS Duration: 78 QT Interval:  345 QTC Calculation: 431 R Axis:   -10 Text  Interpretation:  Sinus rhythm No significant change since last  tracing Confirmed by Ashok Cordia  MD, Lennette Bihari ([id number]) on 08/13/2014 10:18:14 PM      MDM   Final diagnoses:  Lower extremity weakness    Needs admission    Shiree Altemus, MD 08/14/14 [phone]

## 2014-08-14 NOTE — H&P (Signed)
[facility] History and Physical  Patient: [name]  MRN: [id number]  DOB: Jun 12, 1955  DOS: the patient was seen and examined on 08/14/2014 PCP: [name], MD  Referring physician: Dr. Nicholes Stairs Chief Complaint: fall  HPI: [name] is a 59 y.o. female with Past medical history of morbid obesity, essential hypertension, hypothyroidism, GERD, herpes simplex 1 infection anxiety chronic back pain multiple spine surgeries. The patient is presenting with a fall. She mentions she has multiple falls in the last 1 week more than 5. She mentions when she is walking her legs give way. She denies any dizziness or lightheadedness. Pt denies any fever, chills, headache, runny nose, neck pain, choking episodes, cough, rash anywhere,  chest pain, palpitation, shortness of breath, orthopnea, PND,  nausea, vomiting, diarrhea, constipation, abdominal pain, acid reflux, active bleeding, black color BM,  burning urination, increase urinary frequency,  fall, trauma or injury, dizziness, pedal edema, difficulty swallowing, focal neurological deficit.  She denies being on any new medication.   The patient is coming from home.  At her baseline ambulates cane And is independent for most of her ADL manages her medication on her own.  Review of Systems: as mentioned in the history of present illness.  A comprehensive review of the other systems is negative.  Past Medical History  Diagnosis Date  . Depression   . Thyroid disease   . GERD (gastroesophageal reflux disease)   . Pancreatitis     hx of  . Knee pain, bilateral   . Obesity   . HSV (herpes simplex virus) infection   . Hyperlipemia   . Asthma   . Tachycardia     with sx in 2008  . Breast mass in female     L breast 2008, US showed likely fat necrosis  . Anxiety   . Hypertension     sees Dr. Criss Rosales , [facility] [place]  . Hx of radiation therapy 05/05/12- 07/15/12    right breast, 45 gray x 25 fx, lumpectomy cavity  boosted to 16.2 gray  . History of blood transfusion 2011    "after one of my OR's"  . Hypothyroidism   . 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 Polyneuropathy in diabetes(357.2)   . Benign paroxysmal positional vertigo 12/30/2012  . 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  . OSA on CPAP     pt does not know settings  . Pneumonia 1950's  . Anemia   . Type II diabetes mellitus   . History of hiatal hernia   . History of stomach ulcers   . Daily headache   . Migraines     "~ 3 times/month" (01/06/2014)  . Arthritis     "pretty much all my joints"  . Chronic lower back pain   . Schizophrenia   . Kidney stones   . Chronic kidney disease (CKD)     "lower stage" (01/06/2014)   Past Surgical History  Procedure Laterality Date  . Total knee arthroplasty Bilateral 2009-06/2009    left; right  . Foot fracture surgery Right 1990's  . Shoulder open rotator cuff repair Left 1990's  . Thyroidectomy  1970's  . Joint replacement    . Abdominal hysterectomy  1979?    partial  . Cholecystectomy  1980's?  . Colonoscopy with propofol N/A 09/28/2012    Procedure: COLONOSCOPY WITH PROPOFOL;  Surgeon: Lear Ng, MD;  Location: [facility];  Service: Endoscopy;  Laterality: N/A;  .  Revision total knee arthroplasty  07/2009  . Breast surgery Right 01/2012    "cancer"  . Breast lumpectomy  03/03/2012    Procedure: LUMPECTOMY;  Surgeon: Haywood Lasso, MD;  Location: [place] [facility];  Service: General;  Laterality: Right;   Social History:  reports that she quit smoking about 22 years ago. Her smoking use included Cigarettes. She has a 7 pack-year smoking history. She has never used smokeless tobacco. She reports that she drinks alcohol. She reports that she does not use illicit drugs.  Allergies  Allergen Reactions  . Shellfish Allergy Shortness Of Breath  . Bee Venom Hives  . Morphine And Related Other (See Comments)    Overly sedated     Family History  Problem Relation Age of Onset  . Heart disease Brother     Multiple MIs, starting in his 63s  . Diabetes Brother   . Hypertension Mother   . Diabetes Mother   . Breast cancer Mother 60  . Bone cancer Mother   . Hypertension Sister   . Diabetes Sister   . Breast cancer Sister 47  . Breast cancer Maternal Grandmother   . Heart disease Maternal Grandmother   . Uterine cancer Other 19  . Breast cancer Paternal Aunt 6  . Breast cancer Paternal Grandmother     dx in her 54s  . Prostate cancer Paternal Grandfather     Prior to Admission medications   Medication Sig Start Date End Date Taking? Authorizing Provider  aspirin EC 81 MG tablet Take 1 tablet (81 mg total) by mouth daily. 08/12/13   Sheila Oats, MD  CALCIUM PO Take 1 tablet by mouth daily.     Historical Provider, MD  cholecalciferol (VITAMIN D) 1000 UNITS tablet Take 1 tablet (1,000 Units total) by mouth daily. 11/18/12   Chauncey Cruel, MD  diclofenac sodium (VOLTAREN) 1 % GEL Apply 2 g topically 2 (two) times daily.     Historical Provider, MD  esomeprazole (NEXIUM) 40 MG capsule Take 40 mg by mouth daily before breakfast.    Historical Provider, MD  insulin aspart (NOVOLOG) 100 UNIT/ML injection Inject 2 Units into the skin 3 (three) times daily before meals. Sliding scale    Historical Provider, MD  Insulin Detemir (LEVEMIR FLEXPEN) 100 UNIT/ML Pen Inject 15 Units into the skin at bedtime.    Historical Provider, MD  levothyroxine (SYNTHROID, LEVOTHROID) 175 MCG tablet Take 175 mcg by mouth daily before breakfast. Pt takes this medication Tues, Wed, Thurs, Fri per MD orders.    Historical Provider, MD  lisinopril (PRINIVIL,ZESTRIL) 2.5 MG tablet Take 2.5 mg by mouth daily. 12/20/12   Historical Provider, MD  LORazepam (ATIVAN) 2 MG tablet Take 2 mg by mouth daily.    Historical Provider, MD  Multiple Vitamin (MULTIVITAMIN WITH MINERALS) TABS Take 1 tablet by mouth daily.    Historical Provider, MD   oxyCODONE (ROXICODONE) 15 MG immediate release tablet Take 15 mg by mouth 5 (five) times daily.    Historical Provider, MD  pregabalin (LYRICA) 75 MG capsule Take 75 mg by mouth at bedtime. 2 tablets at bedtime    Historical Provider, MD  rosuvastatin (CRESTOR) 20 MG tablet Take 20 mg by mouth daily.    Historical Provider, MD  sitaGLIPtin-metformin (JANUMET) 50-500 MG per tablet Take 1 tablet by mouth 2 (two) times daily with a meal.    Historical Provider, MD  tapentadol (NUCYNTA) 50 MG TABS tablet Take 200 mg by mouth every 12 (twelve)  hours.    Historical Provider, MD  valACYclovir (VALTREX) 1000 MG tablet Take 1,000 mg by mouth daily.    Historical Provider, MD  valsartan-hydrochlorothiazide (DIOVAN-HCT) 160-12.5 MG per tablet Take 1 tablet by mouth daily.  02/08/13   Historical Provider, MD    Physical Exam: Filed Vitals:   08/14/14 0008 08/14/14 0015 08/14/14 0030 08/14/14 0200  BP: 107/46 85/49 95/51 102/55  Pulse: 87 86 84 85  Temp:      TempSrc:      Resp: _0 Height:      Weight:      SpO2: 99% 96% 97% 98%    General: Alert, Awake and Oriented to Time, Place and Person. Appear in moderate distress Eyes: PERRL ENT: Oral Mucosa clear moist. Neck: Difficult to assess JVD Cardiovascular: S1 and S2 Present, no Murmur, Peripheral Pulses Present Respiratory: Bilateral Air entry equal and Decreased,  Clear to Auscultation, no Crackles, no wheezes Abdomen: Bowel SoundPresentft and nono tender Skin: no Rash Extremities: bilateral  Pedal edema, no calf tenderness Neurologic: Grossly no focal neuro deficit.  Labs on Admission:  CBC:  Recent Labs Lab 08/13/14 2311  WBC 11.7*  HGB 8.9*  HCT 26.7*  MCV 85.6  PLT 234    CMP     Component Value Date/Time   NA 128* 08/13/2014 2311   NA 134* 06/26/2014 0925   NA 144 11/15/2013 0500   K 4.6 08/13/2014 2311   K 4.9 06/26/2014 0925   K 4.3 11/15/2013 0500   CL 94* 08/13/2014 2311   CL 115* 11/15/2013 0500   CL  104 04/13/2012 1609   CO2 22 08/13/2014 2311   CO2 26 06/26/2014 0925   CO2 20* 11/15/2013 0500   GLUCOSE 103* 08/13/2014 2311   GLUCOSE 126 06/26/2014 0925   GLUCOSE 107* 11/15/2013 0500   GLUCOSE 133* 04/13/2012 1609   BUN 35* 08/13/2014 2311   BUN 22.8 06/26/2014 0925   BUN 27* 11/15/2013 0500   CREATININE 1.52* 08/13/2014 2311   CREATININE 1.2* 06/26/2014 0925   CREATININE 1.36* 11/15/2013 0500   CALCIUM 8.4* 08/13/2014 2311   CALCIUM 9.6 06/26/2014 0925   CALCIUM 8.1* 11/15/2013 0500   PROT 6.7 06/26/2014 0925   PROT 7.6 01/07/2014 0116   ALBUMIN 3.2* 06/26/2014 0925   ALBUMIN 3.7 01/07/2014 0116   AST 20 06/26/2014 0925   AST 14 01/07/2014 0116   ALT 41 06/26/2014 0925   ALT 10 01/07/2014 0116   ALKPHOS 87 06/26/2014 0925   ALKPHOS 86 01/07/2014 0116   BILITOT 0.23 06/26/2014 0925   BILITOT 0.2* 01/07/2014 0116   GFRNONAA 37* 08/13/2014 2311   GFRAA 43* 08/13/2014 2311    No results for input(s): LIPASE, AMYLASE in the last 168 hours.  No results for input(s): CKTOTAL, CKMB, CKMBINDEX, TROPONINI in the last 168 hours. BNP (last 3 results) No results for input(s): BNP in the last 8760 hours.  ProBNP (last 3 results)  Recent Labs  01/06/14 1010  PROBNP 14.5     Radiological Exams on Admission: Dg Chest 2 View  08/14/2014   CLINICAL DATA:  Bilateral lower extremity weakness  EXAM: CHEST  2 VIEW  COMPARISON:  01/06/2014  FINDINGS: The heart size and mediastinal contours are within normal limits. Both lungs are clear. The visualized skeletal structures are unremarkable.  IMPRESSION: No active cardiopulmonary disease.   Electronically Signed   By: Andreas Newport M.D.   On: 08/14/2014 01:00   Dg Lumbar Spine Complete  08/14/2014   CLINICAL DATA:  Fall today with weakness. Lumbar and sacral back pain. Chronic pain, worsened after fall.  EXAM: LUMBAR SPINE - COMPLETE 4+ VIEW  COMPARISON:  Lumbar spine MRI 04/28/2013  FINDINGS: The alignment is maintained.  Vertebral body heights are normal. There is no listhesis. The posterior elements are intact. No fracture. Minimal disc space narrowing at L3-L4 and L4-L5. Facet arthropathy at L3-L4, L4-L5, and to a lesser extent L5-S1. Sacroiliac joints are symmetric and normal.  IMPRESSION: Degenerative change in the lower lumbar spine without acute fracture or subluxation.   Electronically Signed   By: Jeb Levering M.D.   On: 08/14/2014 02:43   Mr Lumbar Spine W Wo Contrast  08/14/2014   CLINICAL DATA:  Weakness and fall, history of breast cancer, diabetes.  EXAM: MRI LUMBAR SPINE WITHOUT AND WITH CONTRAST  TECHNIQUE: Multiplanar and multiecho pulse sequences of the lumbar spine were obtained without and with intravenous contrast.  CONTRAST:  55m MULTIHANCE GADOBENATE DIMEGLUMINE 529 MG/ML IV SOLN  COMPARISON:  Lumbar spine radiograph August 14, 2014 at 0214 hours and MRI of the lumbar spine April 28, 2013  FINDINGS: Moderately motion degraded examination. The vertebral bodies and posterior elements appear intact and aligned and maintenance of lumbar lordosis. Intervertebral discs demonstrate normal morphology, decreased T2 signal within all lumbar discs consistent with mild desiccation. Mild chronic discogenic endplate change at all lumbar levels, no STIR signal abnormality to suggest acute process. No abnormal osseous or intradiscal enhancement.  Conus medullaris terminates at T12-L1 appears normal morphology and signal characteristics. Cauda equina is normal in appearance. No abnormal cord, leptomeningeal or epidural enhancement. Moderate symmetric paraspinal muscle atrophy. Included prevertebral soft tissues are normal.  Level by level evaluation (due to motion and, bathroom break, the axial sequences do not co-register to the sagittal sequences.):  L1-2: Small broad-based RIGHT extra foraminal disc protrusion is unchanged. Mild facet arthropathy and ligamentum flavum redundancy without canal stenosis or neural  foraminal narrowing.  L2-3: Small RIGHT subarticular disc protrusion. Mild facet arthropathy and ligamentum flavum redundancy without canal stenosis though, there is encroachment upon the traversing RIGHT L2 nerve within the lateral recess, unchanged. Moderate RIGHT, mild LEFT neural foraminal narrowing.  L3-4: Moderate broad-based disc bulge, severe facet arthropathy and ligamentum flavum redundancy, the facets are widened at 3-4 mm, worse than prior examination. Nonenhancing facet effusions. Mild to moderate canal stenosis. Moderate to severe RIGHT, mild LEFT neural foraminal narrowing, slightly worse.  L4-5: Small broad-based disc bulge, severe facet arthropathy and ligamentum flavum redundancy. Mild canal stenosis. Mild to moderate bilateral neural foraminal narrowing.  L5-S1: Moderate central disc protrusion is unchanged, encroaching upon the traversing LEFT S1 nerve within the lateral recess. Moderate facet arthropathy, no canal stenosis. Mild bilateral neural foraminal narrowing.  IMPRESSION: Moderately motion degraded examination without MR findings of acute fracture, malalignment or abnormal enhancement.  Advanced L3-4 degenerative changes including mildly widened facets which can be seen with instability and would be better characterized on on flexion extension radiographs as clinically indicated.  Mild to moderate canal stenosis L3-4, mild at L4-5.  Neural foraminal narrowing L2-3 through L5-S1: Moderate to severe on the RIGHT at L3-4.   Electronically Signed   By: CElon AlasM.D.   On: 08/14/2014 06:00   EKG: Independently reviewed. normal sinus rhythm, nonspecific ST and T waves changes.  Assessment/Plan Principal Problem:   Recurrent falls Active Problems:   Hypothyroidism   Essential hypertension   GERD   Syncope and collapse   Hx  of radiation therapy   Breast cancer of upper-outer quadrant of right female breast   Chronic back pain   1. Recurrent falls  Hypotension  The  patient is presenting with recurrent falls. She has history of syncope. At present she is on lisinopril as well as valsartan therefore I will stop both of them. I would also stop hydrochlorothiazide. We will check orthostatic vitals. PTOT consultation. Pain management. I will reduce her lorazepam from 2 mg scheduled as well as oxycodone from scheduled doses to when necessary doses. She has received 2 L of bolus. Continue aggressive hydration  2. hypothyroidism. Continue Synthroid.  3. essential hypertension. Unclear reason why she is on both lisinopril as well as valsartan. Discontinuing both.  4. history of breast cancer. Continue close monitoring.  5.Morbid obesity. Obesity hyperventilation syndrome. Continue CPAP daily at bedtime.  6. Diabetes mellitus type 2. Check hemoglobin A1c. Placing her on sliding scale.  7. Chronic back pain. MRI is negative for any acute abnormality. Continue close monitoring.  8. Hyponatremia. Patient does not appear to be having any neurological deficit. Continue every 4 hours neuro checks.  Advance goals of care discussion: Full code    DVT Prophylaxis: subcutaneous Heparin Nutrition: cardiac and diabetic diet   Disposition: Admitted as observation, telemetry unit.  Author: Berle Mull, MD [facility] Pager: [phone] 08/14/2014  If 7PM-7AM, please contact night-coverage [email or web address] Password [email or web address]

## 2014-08-14 NOTE — ED Notes (Signed)
Attempted blood draw from foot both from phlebotomy and nurse, unsuccessful. MD notified.

## 2014-08-14 NOTE — Clinical Social Work Note (Signed)
Clinical Social Work Assessment  Patient Details  Name: [name] MRN: [id number] Date of Birth: [birth year]  Date of referral:  08/14/14               Reason for consult:  Facility Placement                Permission sought to share information with:  Case Manager Permission granted to share information::     Name::        Agency::     Relationship::     Contact Information:     Housing/Transportation Living arrangements for the past 2 months:  Single Family Home Source of Information:  Patient Patient Interpreter Needed:  None Criminal Activity/Legal Involvement Pertinent to Current Situation/Hospitalization:  No - Comment as needed Significant Relationships:  Adult Children Lives with:  Self Do you feel safe going back to the place where you live?  Yes Need for family participation in patient care:  No (Coment)  Care giving concerns:  PT recommending SNF; pt declining   Social Worker assessment / plan:  CSW visited pt room to discuss PT recommendation. Pt in pain but very pleasant with CSW. Pt explained to CSW she has previously been to SNF and had a bad experience. CSW explained that CSW could search for alternative SNF that may be better suited for pt and also could ask hospital liaisons for a few facilities to visit pt. However, pt still not wanting to dc to SNF. Pt lives alone but has support from her children. She confirms she understands recommendation and is in agreement that therapy would be beneficial. However, pt feels she will be happier at home and is able to manage. Pt did acknowledge 5 falls since March, but not willing to reconsider SNF at this time. Pt is open to home health with [facility]. RNCM notified. At this time, pt has no further hospital social work needs. CSW signing off.  Employment status:    Nurse, adult PT Recommendations:  [place] / Referral to community resources:   (Pt  declined)  Patient/Family's Response to care:  Pt understanding of recommendation but politely declines SNF and would prefer to dc home.  Patient/Family's Understanding of and Emotional Response to Diagnosis, Current Treatment, and Prognosis:  Pt with good insight on medical condition. Pt emotional response appropriate for discussion and current condition (in pain).  Emotional Assessment Appearance:  Appears stated age Attitude/Demeanor/Rapport:  Complaining Affect (typically observed):  Calm, Pleasant Orientation:  Oriented to Self, Oriented to Place, Oriented to  Time, Oriented to Situation Alcohol / Substance use:  Not Applicable Psych involvement (Current and /or in the community):  No (Comment)  Discharge Needs  Concerns to be addressed:  Patient refuses services Readmission within the last 30 days:  No Current discharge risk:  Dependent with Mobility, Lives alone Barriers to Discharge:  Continued Medical Work up   [employer], [place]

## 2014-08-14 NOTE — Progress Notes (Addendum)
Patient seen and examined, still c/o lower back pain, a little drowsy after prn pain meds, but able to provide detailed history. Reported live by her self, ambulate with a cane, she took oxycodone at 5pm for her chronic lower back pain, did not eat dinner last night due to "mouth sore" felt weak and fall on her buttock later in the evening, no loc, she called EMS. Mri spine no fracture. Review vitals, initial on admission, her bp 88/48, RR 8, suspect from opioids analgesic and dehydration. She received ivf, prn pain meds, admitted to floor.  Explained to the patient the need of close monitor with opioids use, she understand, with elevated cr, not a candidate for NSAIDS. Will continue lyrica. Will continue ivf for another 10hrs. Fall precaution, PT, will at least need home health if not rehab. Care manager consulted.  C/o canker sore pain, viscous lidocaine ordered.

## 2014-08-14 NOTE — ED Notes (Signed)
Per. Dr. Sandria Manly, may stick pt in foot or in right arm for lab draws, pt had mastectomy over 30 years ago.

## 2014-08-14 NOTE — Progress Notes (Signed)
CPAP setup at bedside for patient. 12 cmH20 w/ nasal mask per home settings. Water added to humidity chambers. No O2 bled in. PT understands to call RN/RT with questions and concerns.

## 2014-08-14 NOTE — ED Notes (Signed)
Patient transported to X-ray 

## 2014-08-14 NOTE — Care Management Note (Signed)
Case Management Note  Patient Details  Name: ADDILYNE BACKS MRN: [id number] Date of Birth: October 02, 1955  Subjective/Objective:    Pt admitted for recurrent falls. Pt is from home.                Action/Plan: PT/OT recommendations for SNF. CSW to speak with pt in regards to disposition needs. CM will continue to monitor for additional needs.   Expected Discharge Date:                  Expected Discharge Plan:  Skilled Nursing Facility  In-House Referral:  Clinical Social Work  Discharge planning Services  CM Consult  Post Acute Care Choice:  NA Choice offered to:  NA  DME Arranged:  N/A DME Agency:  NA  HH Arranged:    [place] Agency:     Status of Service:     Medicare Important Message Given:    Date Medicare IM Given:    Medicare IM give by:    Date Additional Medicare IM Given:    Additional Medicare Important Message give by:     If discussed at [place] of Stay Meetings, dates discussed:    Additional Comments:  Bethena Roys, RN 08/14/2014, 2:28 PM

## 2014-08-15 DIAGNOSIS — I959 Hypotension, unspecified: Secondary | ICD-10-CM

## 2014-08-15 DIAGNOSIS — I10 Essential (primary) hypertension: Secondary | ICD-10-CM

## 2014-08-15 DIAGNOSIS — E871 Hypo-osmolality and hyponatremia: Secondary | ICD-10-CM

## 2014-08-15 DIAGNOSIS — M549 Dorsalgia, unspecified: Secondary | ICD-10-CM | POA: Diagnosis not present

## 2014-08-15 DIAGNOSIS — R296 Repeated falls: Secondary | ICD-10-CM | POA: Diagnosis not present

## 2014-08-15 DIAGNOSIS — G8929 Other chronic pain: Secondary | ICD-10-CM

## 2014-08-15 LAB — CBC
HCT: 28.1 % — ABNORMAL LOW (ref 36.0–46.0)
HEMOGLOBIN: 9.3 g/dL — AB (ref 12.0–15.0)
MCH: 28.7 pg (ref 26.0–34.0)
MCHC: 33.1 g/dL (ref 30.0–36.0)
MCV: 86.7 fL (ref 78.0–100.0)
Platelets: 250 10*3/uL (ref 150–400)
RBC: 3.24 MIL/uL — AB (ref 3.87–5.11)
RDW: 14.9 % (ref 11.5–15.5)
WBC: 8.7 10*3/uL (ref 4.0–10.5)

## 2014-08-15 LAB — GLUCOSE, CAPILLARY
GLUCOSE-CAPILLARY: 106 mg/dL — AB (ref 65–99)
GLUCOSE-CAPILLARY: 117 mg/dL — AB (ref 65–99)
GLUCOSE-CAPILLARY: 117 mg/dL — AB (ref 65–99)
Glucose-Capillary: 105 mg/dL — ABNORMAL HIGH (ref 65–99)

## 2014-08-15 LAB — BASIC METABOLIC PANEL
Anion gap: 4 — ABNORMAL LOW (ref 5–15)
BUN: 17 mg/dL (ref 6–20)
CALCIUM: 8.8 mg/dL — AB (ref 8.9–10.3)
CHLORIDE: 105 mmol/L (ref 101–111)
CO2: 26 mmol/L (ref 22–32)
CREATININE: 1.23 mg/dL — AB (ref 0.44–1.00)
GFR calc non Af Amer: 47 mL/min — ABNORMAL LOW (ref >60)
GFR, EST AFRICAN AMERICAN: 55 mL/min — AB (ref >60)
Glucose, Bld: 103 mg/dL — ABNORMAL HIGH (ref 65–99)
Potassium: 4.7 mmol/L (ref 3.5–5.1)
Sodium: 135 mmol/L (ref 135–145)

## 2014-08-15 LAB — HEMOGLOBIN A1C
HEMOGLOBIN A1C: 6.9 % — AB (ref 4.8–5.6)
Hgb A1c MFr Bld: 6.9 % — ABNORMAL HIGH (ref 4.8–5.6)
MEAN PLASMA GLUCOSE: 151 mg/dL
Mean Plasma Glucose: 151 mg/dL

## 2014-08-15 MED ORDER — CALCIUM CARBONATE ANTACID 500 MG PO CHEW
1.0000 | CHEWABLE_TABLET | Freq: Every day | ORAL | Status: DC
  Administered 2014-08-15 – 2014-08-16 (×2): 200 mg via ORAL
  Filled 2014-08-15 (×2): qty 1

## 2014-08-15 NOTE — Progress Notes (Signed)
Nutrition Brief Note  Patient identified on the Malnutrition Screening Tool (MST) Report  Wt Readings from Last 15 Encounters:  08/15/14 292 lb 9.6 oz (132.722 kg)  06/26/14 292 lb 4 oz (132.564 kg)  04/17/14 291 lb 1.6 oz (132.042 kg)  02/24/14 298 lb 1.6 oz (135.217 kg)  01/07/14 281 lb 12.8 oz (127.824 kg)  09/02/13 301 lb 6.4 oz (136.714 kg)  08/12/13 301 lb 4.8 oz (136.669 kg)  06/27/13 308 lb 12.8 oz (140.071 kg)  05/12/13 322 lb 6.4 oz (146.24 kg)  04/21/13 322 lb 12.8 oz (146.421 kg)  01/26/13 325 lb 3.2 oz (147.51 kg)  12/30/12 321 lb (145.605 kg)  12/09/12 320 lb 3.2 oz (145.242 kg)  11/18/12 334 lb 4.8 oz (151.637 kg)  11/10/12 330 lb 6.4 oz (149.868 kg)   [name] is a 59 y.o. female with Past medical history of morbid obesity, essential hypertension, hypothyroidism, GERD, herpes simplex 1 infection anxiety chronic back pain multiple spine surgeries. The patient is presenting with a fall.  Pt reports good appetite overall, but admits to a decreased appetite 3 weeks PTA due to a canker sore in her mouth. She is tolerating meals well here; noted 100% meal completion on doc flowsheets. Pt confirmed she ate 100% ate breakfast today.   Pt reports UBW of 295#, which was confirmed by bedscale. She suspects that she "may have lost a few pounds" due to not eating well. Nutrition-Focused physical exam completed. Findings are no fat depletion, no muscle depletion, and no edema.   Body mass index is 48.69 kg/(m^2). Patient meets criteria for extreme obesity, class III based on current BMI.   Current diet order is Heart Healthy/ Carb Modified, patient is consuming approximately 100% of meals at this time. Labs and medications reviewed.   No nutrition interventions warranted at this time. If nutrition issues arise, please consult RD.   [name] A. [name], RD, LDN, CDE Pager: [phone] After hours Pager: [phone]

## 2014-08-15 NOTE — Clinical Social Work Note (Addendum)
CSW spoke with patient regarding going to a SNF for short term rehab.  Patient stated she is thinking about it and would like to know what facilities have beds available.  CSW received permission to fax out patient's information to try to find a SNF who is able to accept patient.  CSW will continue to follow patient throughout discharge planning.  4:15 pm CSW received phone call from patient who stated she would like to go to [facility] which is close to where her son lives.  CSW contacted [facility] to discuss SNF placement, facility said they will review patients information and determine if they can give a bed offer.  If patient can not go to [facility], she would like [facility] as a second choice.    Jones Broom. [place], MSW, [place] 08/15/2014 12:58 PM

## 2014-08-15 NOTE — Progress Notes (Signed)
Physical Therapy Treatment Patient Details Name: [name] MRN: [id number] DOB: November 01, 1955 Today's Date: 08/15/2014    History of Present Illness 59 y.o. female with PMH of morbid obesity, essential HTN, hypothyroidism, GERD, herpes simplex 1 infection, anxiety, chronic back pain. She presents with a fall and inability to get up and reports at least 5 falls since 3/16 saying that her legs "just give way". She denies any dizziness or lightheadedness. Pt with diminished respiratory rate upon arrival and decline in renal status.    PT Comments    Pt with 10/10 pain which impacts functional mobility.  Sit to stand fluctuating within session from MOD of 2 to MIN of 2.  Pt with very slow guarded gait and only able to ambulate 20'.  Spoke with pt about she was going to care for her self at home and by end of session, pt did ask for more information on SNFs.  Spoke with case management who got in touch with social work.  Con't to recommend SNF. If pt ultimately refuses SNF then pt will need HHPT and [place] aide.  Follow Up Recommendations  SNF     Equipment Recommendations  None recommended by PT    Recommendations for Other Services       Precautions / Restrictions Precautions Precautions: Fall Restrictions Weight Bearing Restrictions: No    Mobility  Bed Mobility Overal bed mobility: Needs Assistance Bed Mobility: Rolling;Sidelying to Sit;Sit to Sidelying Rolling: Supervision Sidelying to sit: Min assist   Sit to supine: Modified independent (Device/Increase time) Sit to sidelying: Min assist General bed mobility comments: Cues for technique. With supine > sit needed MIN A for trunk and sit > supine needed A to get legs up on the bed.  Transfers Overall transfer level: Needs assistance Equipment used: Rolling walker (2 wheeled) Transfers: Sit to/from Stand Sit to Stand: Mod assist;Min assist;+2 physical assistance         General transfer comment: Stood from bed with MOD  of 2. Stood much better from [facility] with MIN of 2.  Ambulation/Gait Ambulation/Gait assistance: Min guard;Min assist Ambulation Distance (Feet): 20 Feet Assistive device: Rolling walker (2 wheeled) Gait Pattern/deviations: Step-to pattern;Trunk flexed Gait velocity: slow and guarded Gait velocity interpretation: Below normal speed for age/gender General Gait Details: MIN/guard for steadying and due to hx of legs giving way. MIN A with turning and taking steps backwards to [facility] for proper placement within RW.   Stairs            Wheelchair Mobility    Modified Rankin (Stroke Patients Only)       Balance     Sitting balance-Leahy Scale: Fair     Standing balance support: Bilateral upper extremity supported;During functional activity Standing balance-Leahy Scale: Fair Standing balance comment: Able to wipe self and keep other UE on RW.                    Cognition Arousal/Alertness: Awake/alert Behavior During Therapy: Flat affect Overall Cognitive Status: Within Functional Limits for tasks assessed                      Exercises      General Comments        Pertinent Vitals/Pain Pain Assessment: 0-10 Pain Score: 10-Worst pain ever Pain Location: back Pain Descriptors / Indicators: Aching;Burning Pain Intervention(s): Repositioned;Limited activity within patient's tolerance    Home Living  Prior Function            PT Goals (current goals can now be found in the care plan section) Acute Rehab PT Goals Patient Stated Goal: wants to go home PT Goal Formulation: With patient Time For Goal Achievement: 08/28/14 Potential to Achieve Goals: Fair Progress towards PT goals: Progressing toward goals    Frequency  Min 3X/week    PT Plan Current plan remains appropriate    Co-evaluation             End of Session Equipment Utilized During Treatment: Gait belt Activity Tolerance: Patient limited by  pain Patient left: in bed;with call bell/phone within reach     Time: 1123-1144 PT Time Calculation (min) (ACUTE ONLY): 21 min  Charges:  $Gait Training: 8-22 mins                    G Codes:      [name] 08/15/2014, 12:08 PM

## 2014-08-15 NOTE — Progress Notes (Addendum)
Occupational Therapy Treatment Patient Details Name: [name] MRN: [id number] DOB: August 26, 1955 Today's Date: 08/15/2014    History of present illness 59 y.o. female with PMH of morbid obesity, essential HTN, hypothyroidism, GERD, herpes simplex 1 infection, anxiety, chronic back pain. She presents with a fall and inability to get up and reports at least 5 falls since 3/16 saying that her legs "just give way". She denies any dizziness or lightheadedness. Pt with diminished respiratory rate upon arrival and decline in renal status.   OT comments  Pt progressing, but was tearful due to pain. Education provided in session. Continue to recommend SNF, but pt refusing, so recommend HHOT if pt continues to refuse.   Follow Up Recommendations  SNF;Home health OT    Equipment Recommendations  Other (comment) (AE)    Recommendations for Other Services      Precautions / Restrictions Precautions Precautions: Fall Restrictions Weight Bearing Restrictions: No       Mobility Bed Mobility Overal bed mobility: Needs Assistance Bed Mobility: Sit to Supine;Rolling;Sidelying to Sit Rolling: Supervision Sidelying to sit: Supervision   Sit to supine: Modified independent (Device/Increase time)   General bed mobility comments: cues for technique.   Transfers Overall transfer level: Needs assistance Equipment used: Rolling walker (2 wheeled) Transfers: Sit to/from Stand Sit to Stand: Min guard         General transfer comment: cues for hand placement    Balance  No LOB in session-used RW.                                 ADL Overall ADL's : Needs assistance/impaired     Grooming: Wash/dry face;Set up;Sitting               Lower Body Dressing: Set up;Supervision/safety;With adaptive equipment;Sitting/lateral leans (practiced donning/doffing socks)   Toilet Transfer: Min guard;Stand-pivot;RW;BSC (pivotal steps)   Toileting- Clothing Manipulation and  Hygiene: Min guard;Sit to/from stand       Functional mobility during ADLs: Min guard (stand pivot (pivotal steps) and side stepped towards HOB) General ADL Comments: Educated on toilet aide for hygiene/what pt could use for this (hot dog or grill tongs) and suggested wipes. Practiced with AE in session and OT educated on this. Suggested using cup for oral care to prevent bending/back pain.      Vision                     Perception     Praxis      Cognition  Awake/Alert Behavior During Therapy: Flat affect Overall Cognitive Status: Within Functional Limits for tasks assessed (decreased safety awareness-refusing SNF)                       Extremity/Trunk Assessment               Exercises     Shoulder Instructions       General Comments      Pertinent Vitals/ Pain       Pain Assessment: 0-10 Pain Score: 10-Worst pain ever Pain Location: back Pain Descriptors / Indicators: Aching;Burning;Throbbing; crying Pain Intervention(s): Monitored during session;Other (comment);Repositioned;Limited activity within patient's tolerance (asked nurse about pain meds)  Home Living  Prior Functioning/Environment              Frequency Min 3X/week     Progress Toward Goals  OT Goals(current goals can now be found in the care plan section)  Progress towards OT goals: Progressing toward goals  Acute Rehab OT Goals Patient Stated Goal: wants to go home OT Goal Formulation: With patient Time For Goal Achievement: 08/28/14 Potential to Achieve Goals: Good ADL Goals Pt Will Perform Lower Body Bathing: with set-up;with supervision;with adaptive equipment;sit to/from stand Pt Will Perform Lower Body Dressing: with set-up;with supervision;with adaptive equipment;sit to/from stand Pt Will Transfer to Toilet: with supervision;bedside commode;stand pivot transfer Pt Will Perform Toileting - Clothing  Manipulation and hygiene: with supervision;with adaptive equipment;sitting/lateral leans  Plan Discharge plan remains appropriate    Co-evaluation                 End of Session Equipment Utilized During Treatment: Gait belt;Rolling walker   Activity Tolerance Patient limited by pain   Patient Left in bed;with call bell/phone within reach   Nurse Communication Mobility status;Other (comment) (pt crying in pain; asked about pain meds;vtach read on monitor-nurse verified it was false reading)        Time: [phone] (approximately 2-3 minutes spent urinating on BSC) OT Time Calculation (min): 19 min  Charges: OT General Charges $OT Visit: 1 Procedure OT Treatments $Self Care/Home Management : 8-22 mins  Benito Mccreedy OTR/L [phone] 08/15/2014, 10:17 AM

## 2014-08-15 NOTE — Progress Notes (Signed)
PROGRESS NOTE  [name] CSR:[id number] DOB: 03-08-1955 DOA: 08/13/2014 PCP: [name], MD  HPI/Recap of past 24 hours:  Feeling better, less pain, progressed slowly with PT/OT, now agree with SNF placement  Assessment/Plan: Principal Problem:   Recurrent falls Active Problems:   Hypothyroidism   Essential hypertension   GERD   Syncope and collapse   Hx of radiation therapy   Breast cancer of upper-outer quadrant of right female breast   Chronic back pain  1. Recurrent falls/hypotension/respiration rate of 8 on presentation.  Hypotension s/p hydration.  She has history of syncope. on lisinopril as well as valsartan /hydrochlorathiazide at home which are all stopped here since admission.  orthostatic vitals wnl. Pain management. PTOT SNF.   2. hypothyroidism. Continue Synthroid. TSH wnl  3. essential hypertension, normal tensive without bp meds in the hospital, hypotensive on admission. Unclear reason why she is on both lisinopril as well as valsartan/httz Discontinuing all bp meds  4. history of breast cancer. Continue close monitoring.  5.Morbid obesity. Obesity hyperventilation syndrome. Continue CPAP daily at bedtime.  6. Diabetes mellitus type 2. hemoglobin A1c 6.9, home long acting insulin on hold, patient blood sugar wnl without needing ssi here.  Elevated a1c may be related to diet or meds noncompliance.   7. Chronic back pain. MRI is negative for any acute abnormality. Continue close monitoring. Patient reported followed by preferred pain management and getting spine steroid injection every few months. She is also followed by Dr. Sharol Given.  8. Hyponatremia, resolved after hydration. From diuretics? Or poor oral intake. No neurological deficit. D/c q 4 hours neuro checks.  Advance goals of care discussion: Full code   DVT Prophylaxis: subcutaneous Heparin Nutrition: cardiac and diabetic diet   Code Status: full  Family  Communication: patient  Disposition Plan: SNF   Consultants:  none  Procedures:  MRI spine  Antibiotics:  none   Objective: BP 127/69 mmHg  Pulse 84  Temp(Src) 99 F (37.2 C) (Oral)  Resp 14  Ht 5' 5" (1.651 m)  Wt 132.722 kg (292 lb 9.6 oz)  BMI 48.69 kg/m2  SpO2 97%  Intake/Output Summary (Last 24 hours) at 08/15/14 1407 Last data filed at 08/15/14 1258  Gross per 24 hour  Intake   1080 ml  Output   2750 ml  Net  -1670 ml   Filed Weights   08/13/14 2202 08/14/14 0840 08/15/14 0400  Weight: 128.368 kg (283 lb) 132 kg (291 lb 0.1 oz) 132.722 kg (292 lb 9.6 oz)    Exam:   General:  NAD, obese  Cardiovascular: RRR  Respiratory: CTABL  Abdomen: Soft/ND/NT, positive BS  Musculoskeletal: No Edema  Neuro: aaox3, nonfocal  Data Reviewed: Basic Metabolic Panel:  Recent Labs Lab 08/13/14 2311 08/14/14 0730 08/15/14 0535  NA 128* 136 135  K 4.6 4.6 4.7  CL 94* 103 105  CO2 _0 GLUCOSE 103* 100* 103*  BUN 35* 25* 17  CREATININE 1.52* 1.34* 1.23*  CALCIUM 8.4* 9.2 8.8*   Liver Function Tests:  Recent Labs Lab 08/14/14 0730  AST 21  ALT 21  ALKPHOS 86  BILITOT 0.2*  PROT 6.1*  ALBUMIN 3.0*   No results for input(s): LIPASE, AMYLASE in the last 168 hours. No results for input(s): AMMONIA in the last 168 hours. CBC:  Recent Labs Lab 08/13/14 2311 08/14/14 0730 08/15/14 0535  WBC 11.7* 10.5 8.7  NEUTROABS  --  7.6  --   HGB 8.9* 9.7* 9.3*  HCT 26.7* 29.4* 28.1*  MCV 85.6 85.2 86.7  PLT 234 270 250   Cardiac Enzymes:   No results for input(s): CKTOTAL, CKMB, CKMBINDEX, TROPONINI in the last 168 hours. BNP (last 3 results) No results for input(s): BNP in the last 8760 hours.  ProBNP (last 3 results)  Recent Labs  01/06/14 1010  PROBNP 14.5    CBG:  Recent Labs Lab 08/14/14 1145 08/14/14 1702 08/14/14 2142 08/15/14 0734 08/15/14 1123  GLUCAP 150* 104* 148* 105* 106*    No results found for this or any  previous visit (from the past 240 hour(s)).   Studies: No results found.  Scheduled Meds: . aspirin EC  81 mg Oral Daily  . calcium carbonate  1 tablet Oral Daily  . heparin  5,000 Units Subcutaneous 3 times per day  . insulin aspart  0-15 Units Subcutaneous TID WC  . insulin aspart  0-5 Units Subcutaneous QHS  . levothyroxine  175 mcg Oral QAC breakfast  . pantoprazole  40 mg Oral Daily  . pregabalin  75 mg Oral QHS  . rosuvastatin  20 mg Oral Daily  . sodium chloride  3 mL Intravenous Q12H  . tapentadol  200 mg Oral Q12H  . valACYclovir  1,000 mg Oral Daily    Continuous Infusions:    Time spent: 74mns  Polly Barner MD, PhD  [facility] Pager [phone] If 7PM-7AM, please contact night-coverage at [email or web address], password [facility]6/28/2016, 2:07 PM  LOS: 1 day

## 2014-08-15 NOTE — Progress Notes (Signed)
Pt says she will put CPAP on when she is ready to wear.  CPAP plugged in and in reach and humidity chamber filled. No O2 needed. She understands to call with questions/conerns.

## 2014-08-16 DIAGNOSIS — I10 Essential (primary) hypertension: Secondary | ICD-10-CM | POA: Diagnosis not present

## 2014-08-16 DIAGNOSIS — R296 Repeated falls: Secondary | ICD-10-CM | POA: Diagnosis not present

## 2014-08-16 DIAGNOSIS — M549 Dorsalgia, unspecified: Secondary | ICD-10-CM | POA: Diagnosis not present

## 2014-08-16 DIAGNOSIS — E039 Hypothyroidism, unspecified: Secondary | ICD-10-CM | POA: Diagnosis not present

## 2014-08-16 LAB — GLUCOSE, CAPILLARY
GLUCOSE-CAPILLARY: 103 mg/dL — AB (ref 65–99)
GLUCOSE-CAPILLARY: 95 mg/dL (ref 65–99)

## 2014-08-16 IMAGING — CR DG CHEST 2V
2 series · 2 of 2 positions shown · non-contrast
Comparison: None.

CLINICAL DATA: Six 4 days with cough

EXAM:
CHEST  2 VIEW

[view not recorded (1 of 2)]
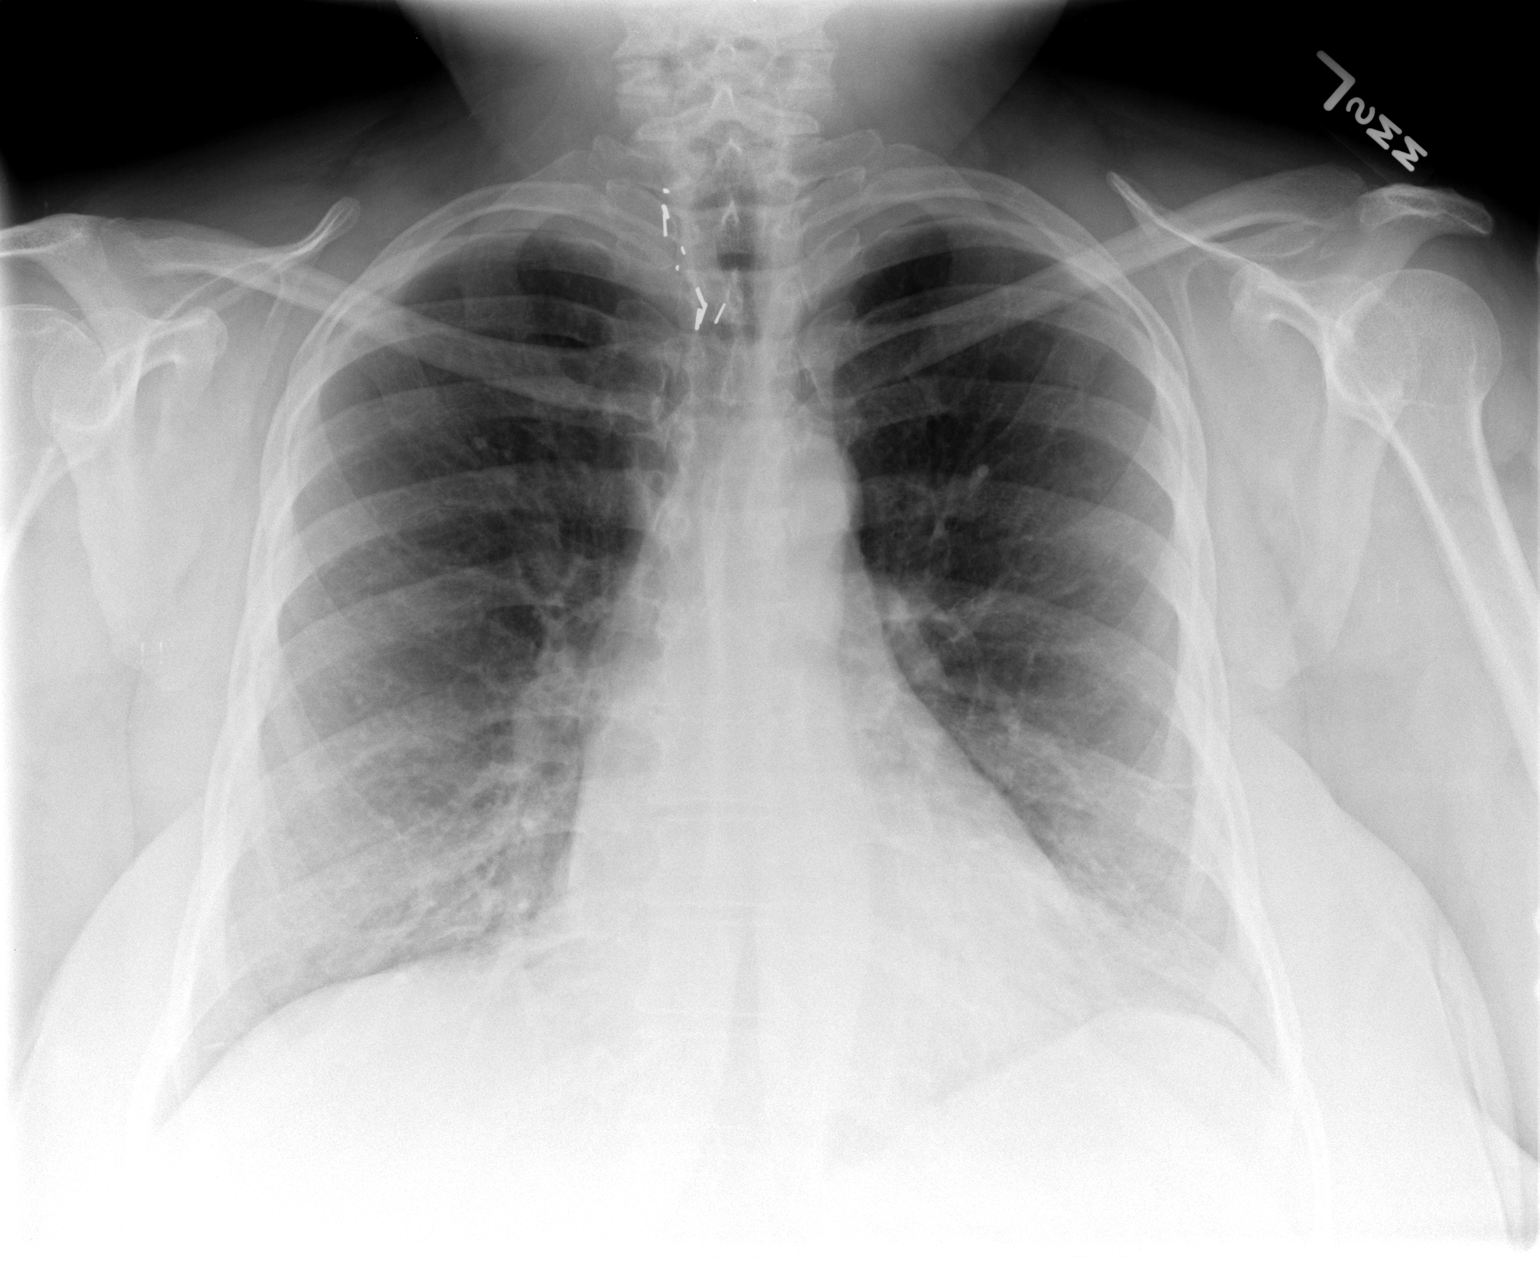

[view not recorded (2 of 2)]
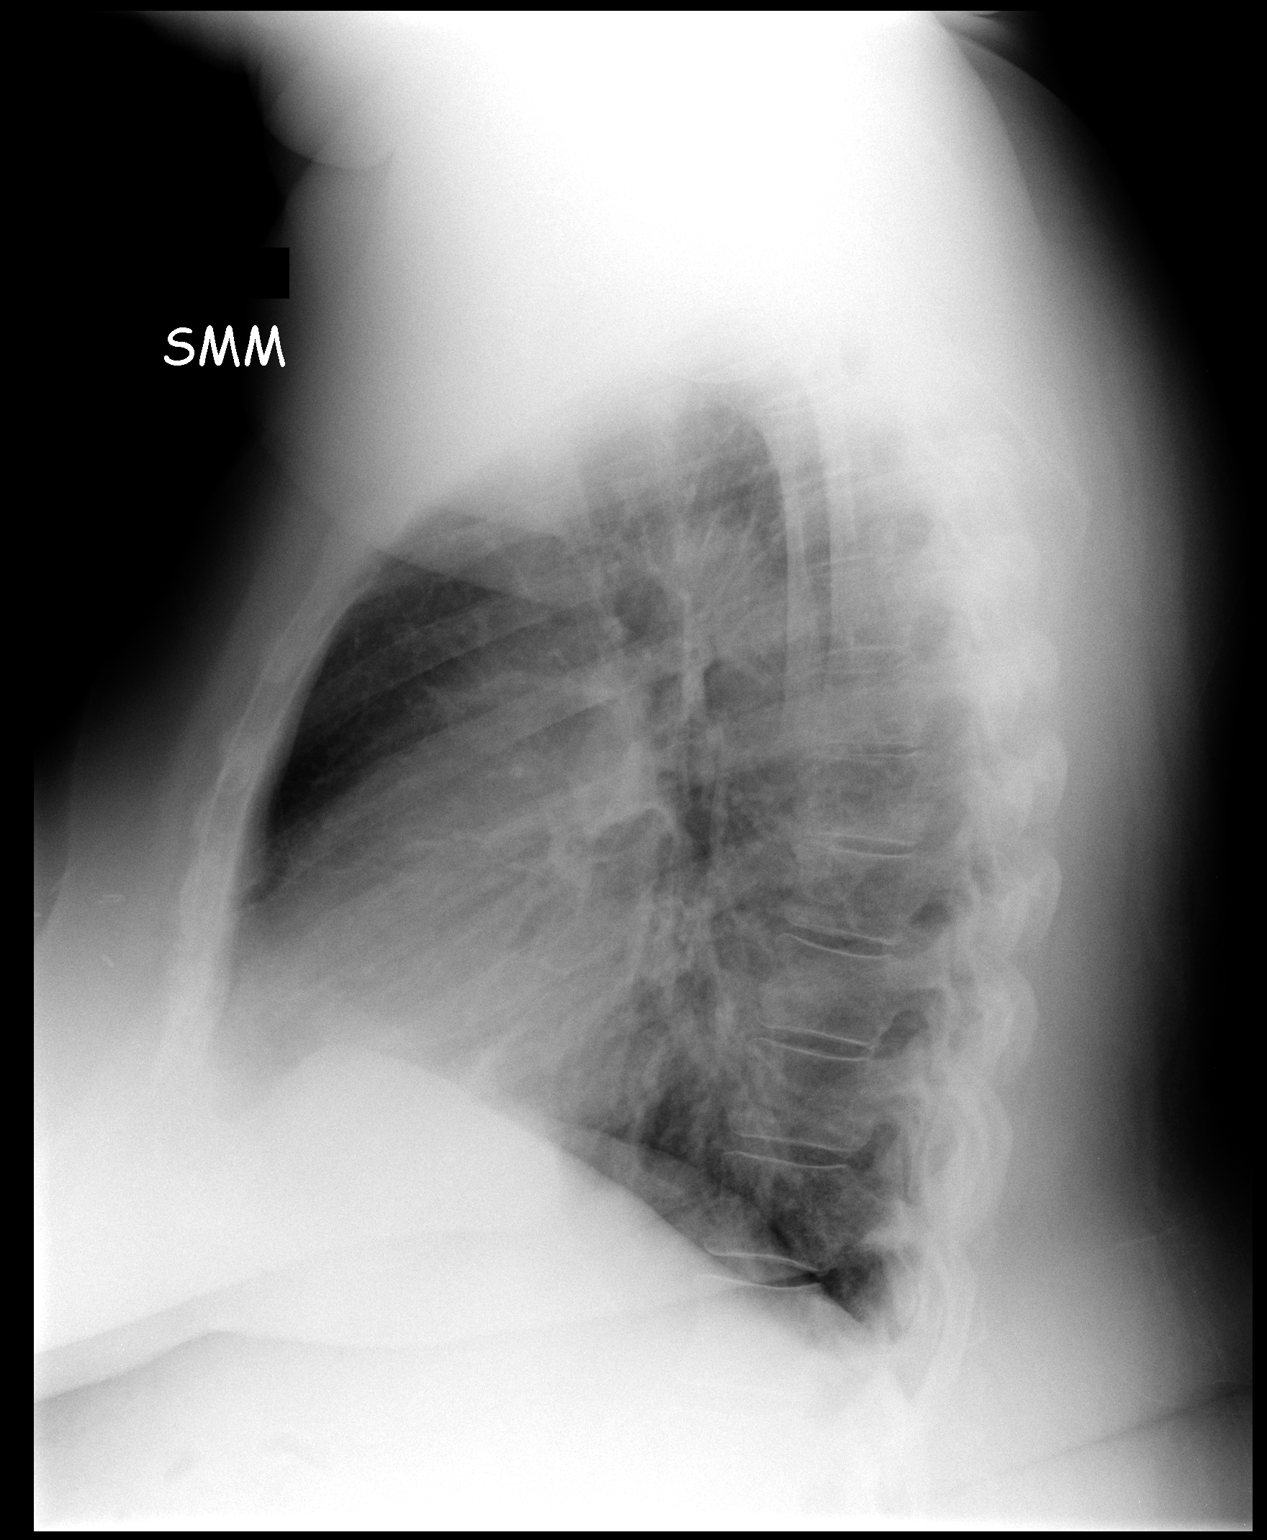

[2 of 2 positions shown; findings below may reference images not displayed]

FINDINGS: The heart size and mediastinal contours are within normal limits.
Both lungs are clear. The visualized skeletal structures are
unremarkable. There are surgical clips in the right lower neck.
IMPRESSION: No active cardiopulmonary disease.

## 2014-08-16 MED ORDER — OXYCODONE HCL 15 MG PO TABS: 15.0000 mg | ORAL_TABLET | Freq: Three times a day (TID) | ORAL | Status: DC | PRN

## 2014-08-16 MED ORDER — PREGABALIN 75 MG PO CAPS: 150.0000 mg | ORAL_CAPSULE | Freq: Every day | ORAL | Status: DC

## 2014-08-16 MED ORDER — FAMOTIDINE 20 MG PO TABS
20.0000 mg | ORAL_TABLET | Freq: Two times a day (BID) | ORAL | Status: DC | PRN
  Administered 2014-08-16 (×2): 20 mg via ORAL
  Filled 2014-08-16 (×2): qty 1

## 2014-08-16 MED ORDER — LORAZEPAM 2 MG PO TABS: 2.0000 mg | ORAL_TABLET | Freq: Every day | ORAL | Status: DC | PRN

## 2014-08-16 NOTE — Care Management Note (Signed)
Case Management Note  Patient Details  Name: [name] MRN: [id number] Date of Birth: December 18, 1955  Subjective/Objective:   Pt admitted for recent falls. Plan is to return home.                  Action/Plan: CM did make referral with [facility]. SOC to begin within 24-48 hrs post d/c. No further needs from CM at this time.    Expected Discharge Date:                  Expected Discharge Plan:  [place]  In-House Referral:  Clinical Social Work  Discharge planning Services  CM Consult  Post Acute Care Choice:  Home Health Choice offered to:  Patient  DME Arranged:  N/A DME Agency:  [place] Arranged:  RN, PT, OT, Nurse's Aide (Education officer, museum) [place]:  [place]  Status of Service:  Completed, signed off  Medicare Important Message Given:    Date Medicare IM Given:    Medicare IM give by:    Date Additional Medicare IM Given:    Additional Medicare Important Message give by:     If discussed at [place] of Stay Meetings, dates discussed:    Additional Comments:  Bethena Roys, RN 08/16/2014, 10:07 AM

## 2014-08-16 NOTE — Progress Notes (Signed)
PT Cancellation Note  Patient Details Name: [name] MRN: [id number] DOB: July 31, 1955   Cancelled Treatment:    Reason Eval/Treat Not Completed: Patient declined, no reason specified. Pt sitting EOB getting ready for d/c. Declined PT.  States she has RW and HHPT has already called her to set up visits.   Keyler Hoge LUBECK 08/16/2014, 11:35 AM

## 2014-08-16 NOTE — Clinical Social Work Note (Signed)
CSW received phone call from patient who stated she is feeling better and would like to go home with home health instead.  CSW to notify case manager and give her an update on patient's plan.  Jones Broom. Magee, MSW, [place] 08/16/2014 8:49 AM

## 2014-08-16 NOTE — Discharge Summary (Signed)
Physician Discharge Summary  [name] WGN:[id number] DOB: February 29, 1955 DOA: 08/13/2014  PCP: [name], MD  Admit date: 08/13/2014 Discharge date: 08/16/2014  Time spent: >35 minutes  Recommendations for Outpatient Follow-up:  Home HHC F/u with PCP in 1 week F/u with pain clinic in 3-7 days   Discharge Diagnoses:  Principal Problem:   Recurrent falls Active Problems:   Hypothyroidism   Essential hypertension   GERD   Syncope and collapse   Hx of radiation therapy   Breast cancer of upper-outer quadrant of right female breast   Chronic back pain   Discharge Condition: stable   Diet recommendation: low sodium. DM  Filed Weights   08/14/14 0840 08/15/14 0400 08/16/14 0619  Weight: 132 kg (291 lb 0.1 oz) 132.722 kg (292 lb 9.6 oz) 130.908 kg (288 lb 9.6 oz)    History of present illness:  59 y.o. female with Past medical history of morbid obesity, essential hypertension, hypothyroidism, GERD, herpes simplex 1 infection anxiety chronic back pain multiple spine surgeries. The patient is presenting with a fall.  Hospital Course:  1. Recurrent falls/hypotension -Fall thought due to dehydration, hypotension due to overmedication. Neuro exam is no focal.  Patient hydrated with IVF. Initially patient agreed for SNF/rehab, but then declined and decided to go home with [place]  2. Hypotension. history of syncope likely due to lisinopril + valsartan /hydrochlorathiazide  -BP improved with IVF. BP is stable off medications. D/w patient, we will hold BP meds. She will f/u with PCP next week to reevaluate for medication treatment  2. Hypothyroidism. Continue Synthroid. TSH wnl 3. essential hypertension, normal tensive without bp meds in the hospital, hypotensive on admission.as in #2 4. history of breast cancer. Continue close monitoring. F/u as outpatient  5.Morbid obesity. Obesity hyperventilation syndrome. Continue CPAP daily at bedtime. 6. Diabetes mellitus type 2. hemoglobin  A1c 6.9, home long acting insulin on hold, patient blood sugar wnl without needing ssi here. We will hold metformin due to renal dysfunction. Recommended to f/u with PCP in 1 week  7. Chronic back pain. MRI is negative for any acute abnormality. Patient reported followed by preferred pain management and getting spine steroid injection every few months. -d/w patient recommended to avoid opioid overdose, changed oxycodone to prn. F/u with pain management    Procedures:  none (i.e. Studies not automatically included, echos, thoracentesis, etc; not x-rays)  Consultations:  none  Discharge Exam: Filed Vitals:   08/16/14 0732  BP: 97/64  Pulse: 77  Temp: 98.6 F (37 C)  Resp: 17    General: alert. No distress  Cardiovascular: s1,s2 rrr Respiratory: CTA BL  Discharge Instructions  Discharge Instructions    Diet - low sodium heart healthy    Complete by:  As directed      Discharge instructions    Complete by:  As directed   Please follow up with primary care doctor in 1 week     Increase activity slowly    Complete by:  As directed             Medication List    STOP taking these medications        LEVEMIR FLEXPEN 100 UNIT/ML Pen  Generic drug:  Insulin Detemir     lisinopril 2.5 MG tablet  Commonly known as:  PRINIVIL,ZESTRIL     sitaGLIPtin-metformin 50-500 MG per tablet  Commonly known as:  JANUMET     valsartan-hydrochlorothiazide 160-12.5 MG per tablet  Commonly known as:  DIOVAN-HCT  TAKE these medications        aspirin EC 81 MG tablet  Take 1 tablet (81 mg total) by mouth daily.     CALCIUM PO  Take 1 tablet by mouth daily.     cholecalciferol 1000 UNITS tablet  Commonly known as:  VITAMIN D  Take 1 tablet (1,000 Units total) by mouth daily.     diclofenac sodium 1 % Gel  Commonly known as:  VOLTAREN  Apply 2 g topically 2 (two) times daily as needed (pain).     esomeprazole 40 MG capsule  Commonly known as:  NEXIUM  Take 40 mg by mouth  daily before breakfast.     insulin aspart 100 UNIT/ML injection  Commonly known as:  novoLOG  Inject 0-6 Units into the skin 3 (three) times daily as needed for high blood sugar (200-299 = 4 units, 300 and above = 6 units). Sliding scale     levothyroxine 175 MCG tablet  Commonly known as:  SYNTHROID, LEVOTHROID  Take 175 mcg by mouth daily before breakfast.     LORazepam 2 MG tablet  Commonly known as:  ATIVAN  Take 1 tablet (2 mg total) by mouth daily as needed for anxiety.     multivitamin with minerals Tabs tablet  Take 1 tablet by mouth daily.     oxyCODONE 15 MG immediate release tablet  Commonly known as:  ROXICODONE  Take 1 tablet (15 mg total) by mouth every 8 (eight) hours as needed for pain.     pregabalin 75 MG capsule  Commonly known as:  LYRICA  Take 2 capsules (150 mg total) by mouth at bedtime. 2 tablets at bedtime     rosuvastatin 20 MG tablet  Commonly known as:  CRESTOR  Take 20 mg by mouth daily.     tapentadol 50 MG Tabs tablet  Commonly known as:  NUCYNTA  Take 200 mg by mouth every 12 (twelve) hours.     valACYclovir 1000 MG tablet  Commonly known as:  VALTREX  Take 1,000 mg by mouth daily.       Allergies  Allergen Reactions  . Shellfish Allergy Shortness Of Breath  . Bee Venom Hives  . Morphine And Related Other (See Comments)    Overly sedated       Follow-up Information    Follow up with DUDA,MARCUS V, MD In 4 weeks.   Specialty:  Orthopedic Surgery   Contact information:   [place] [address] [phone]       Follow up with Prince Solian, PA-C In 1 month.   Specialty:  Physician Assistant   Why:  pain menagement   Contact information:   [address] [place] [address] [phone]       Follow up with [name], MD. Schedule an appointment as soon as possible for a visit in 1 week.   Specialty:  Internal Medicine   Contact information:   [address] [place] [address] [phone]        The results of significant diagnostics from this hospitalization (including imaging, microbiology, ancillary and laboratory) are listed below for reference.    Significant Diagnostic Studies: Dg Chest 2 View  08/14/2014   CLINICAL DATA:  Bilateral lower extremity weakness  EXAM: CHEST  2 VIEW  COMPARISON:  01/06/2014  FINDINGS: The heart size and mediastinal contours are within normal limits. Both lungs are clear. The visualized skeletal structures are unremarkable.  IMPRESSION:  No active cardiopulmonary disease.   Electronically Signed   By: Andreas Newport M.D.   On: 08/14/2014 01:00   Dg Lumbar Spine Complete  08/14/2014   CLINICAL DATA:  Fall today with weakness. Lumbar and sacral back pain. Chronic pain, worsened after fall.  EXAM: LUMBAR SPINE - COMPLETE 4+ VIEW  COMPARISON:  Lumbar spine MRI 04/28/2013  FINDINGS: The alignment is maintained. Vertebral body heights are normal. There is no listhesis. The posterior elements are intact. No fracture. Minimal disc space narrowing at L3-L4 and L4-L5. Facet arthropathy at L3-L4, L4-L5, and to a lesser extent L5-S1. Sacroiliac joints are symmetric and normal.  IMPRESSION: Degenerative change in the lower lumbar spine without acute fracture or subluxation.   Electronically Signed   By: Jeb Levering M.D.   On: 08/14/2014 02:43   Mr Lumbar Spine W Wo Contrast  08/14/2014   CLINICAL DATA:  Weakness and fall, history of breast cancer, diabetes.  EXAM: MRI LUMBAR SPINE WITHOUT AND WITH CONTRAST  TECHNIQUE: Multiplanar and multiecho pulse sequences of the lumbar spine were obtained without and with intravenous contrast.  CONTRAST:  70m MULTIHANCE GADOBENATE DIMEGLUMINE 529 MG/ML IV SOLN  COMPARISON:  Lumbar spine radiograph August 14, 2014 at 0214 hours and MRI of the lumbar spine April 28, 2013  FINDINGS: Moderately motion degraded examination. The vertebral bodies and posterior elements appear intact and  aligned and maintenance of lumbar lordosis. Intervertebral discs demonstrate normal morphology, decreased T2 signal within all lumbar discs consistent with mild desiccation. Mild chronic discogenic endplate change at all lumbar levels, no STIR signal abnormality to suggest acute process. No abnormal osseous or intradiscal enhancement.  Conus medullaris terminates at T12-L1 appears normal morphology and signal characteristics. Cauda equina is normal in appearance. No abnormal cord, leptomeningeal or epidural enhancement. Moderate symmetric paraspinal muscle atrophy. Included prevertebral soft tissues are normal.  Level by level evaluation (due to motion and, bathroom break, the axial sequences do not co-register to the sagittal sequences.):  L1-2: Small broad-based RIGHT extra foraminal disc protrusion is unchanged. Mild facet arthropathy and ligamentum flavum redundancy without canal stenosis or neural foraminal narrowing.  L2-3: Small RIGHT subarticular disc protrusion. Mild facet arthropathy and ligamentum flavum redundancy without canal stenosis though, there is encroachment upon the traversing RIGHT L2 nerve within the lateral recess, unchanged. Moderate RIGHT, mild LEFT neural foraminal narrowing.  L3-4: Moderate broad-based disc bulge, severe facet arthropathy and ligamentum flavum redundancy, the facets are widened at 3-4 mm, worse than prior examination. Nonenhancing facet effusions. Mild to moderate canal stenosis. Moderate to severe RIGHT, mild LEFT neural foraminal narrowing, slightly worse.  L4-5: Small broad-based disc bulge, severe facet arthropathy and ligamentum flavum redundancy. Mild canal stenosis. Mild to moderate bilateral neural foraminal narrowing.  L5-S1: Moderate central disc protrusion is unchanged, encroaching upon the traversing LEFT S1 nerve within the lateral recess. Moderate facet arthropathy, no canal stenosis. Mild bilateral neural foraminal narrowing.  IMPRESSION: Moderately motion  degraded examination without MR findings of acute fracture, malalignment or abnormal enhancement.  Advanced L3-4 degenerative changes including mildly widened facets which can be seen with instability and would be better characterized on on flexion extension radiographs as clinically indicated.  Mild to moderate canal stenosis L3-4, mild at L4-5.  Neural foraminal narrowing L2-3 through L5-S1: Moderate to severe on the RIGHT at L3-4.   Electronically Signed   By: CElon AlasM.D.   On: 08/14/2014 06:00    Microbiology: No results found for this or any previous visit (from the  past 240 hour(s)).   Labs: Basic Metabolic Panel:  Recent Labs Lab 08/13/14 2311 08/14/14 0730 08/15/14 0535  NA 128* 136 135  K 4.6 4.6 4.7  CL 94* 103 105  CO2 _0 GLUCOSE 103* 100* 103*  BUN 35* 25* 17  CREATININE 1.52* 1.34* 1.23*  CALCIUM 8.4* 9.2 8.8*   Liver Function Tests:  Recent Labs Lab 08/14/14 0730  AST 21  ALT 21  ALKPHOS 86  BILITOT 0.2*  PROT 6.1*  ALBUMIN 3.0*   No results for input(s): LIPASE, AMYLASE in the last 168 hours. No results for input(s): AMMONIA in the last 168 hours. CBC:  Recent Labs Lab 08/13/14 2311 08/14/14 0730 08/15/14 0535  WBC 11.7* 10.5 8.7  NEUTROABS  --  7.6  --   HGB 8.9* 9.7* 9.3*  HCT 26.7* 29.4* 28.1*  MCV 85.6 85.2 86.7  PLT 234 270 250   Cardiac Enzymes: No results for input(s): CKTOTAL, CKMB, CKMBINDEX, TROPONINI in the last 168 hours. BNP: BNP (last 3 results) No results for input(s): BNP in the last 8760 hours.  ProBNP (last 3 results)  Recent Labs  01/06/14 1010  PROBNP 14.5    CBG:  Recent Labs Lab 08/15/14 0734 08/15/14 1123 08/15/14 1628 08/15/14 2122 08/16/14 0731  GLUCAP 105* 106* 117* 117* 103*       Signed:  Rowe Clack N  [facility] 08/16/2014, 9:44 AM

## 2014-08-17 IMAGING — CT CT ANGIO CHEST
2 of 9 series · 19 of 46 positions shown · IV contrast (APPLIED)
Comparison: 06/04/2011

CLINICAL DATA: Chest pain and shortness of Breath.

EXAM:
CT ANGIOGRAPHY CHEST WITH CONTRAST
TECHNIQUE: Multidetector CT imaging of the chest was performed using the
standard protocol during bolus administration of intravenous
contrast. Multiplanar CT image reconstructions including MIPs were
obtained to evaluate the vascular anatomy.
CONTRAST:  80mL OMNIPAQUE IOHEXOL 350 MG/ML SOLN

[Series 6: thins · axial · 0.72mm/px · z∈[+528,+779]mm · 16 of 277 slices shown]
[im 13/277  lung]
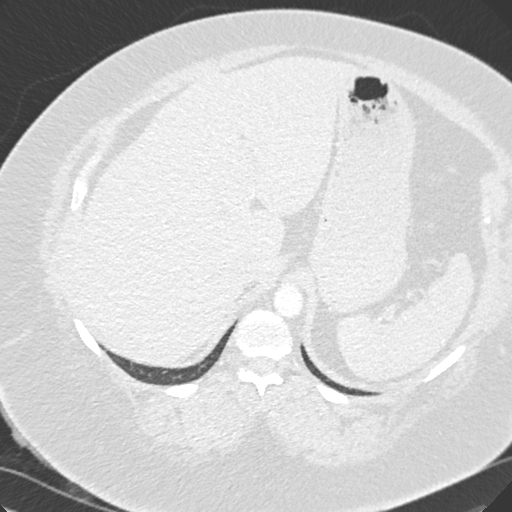
[im 26/277  soft-tissue]
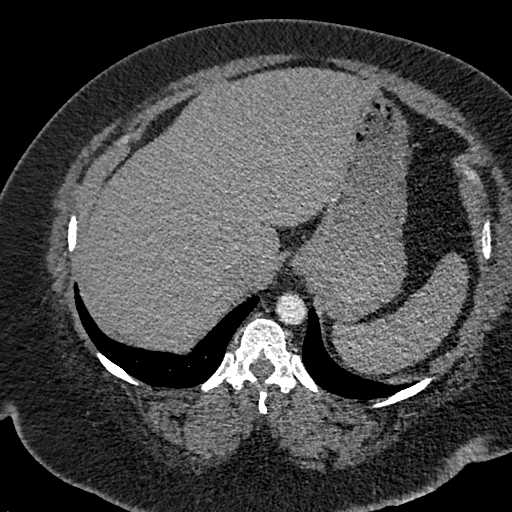
[im 51/277  lung]
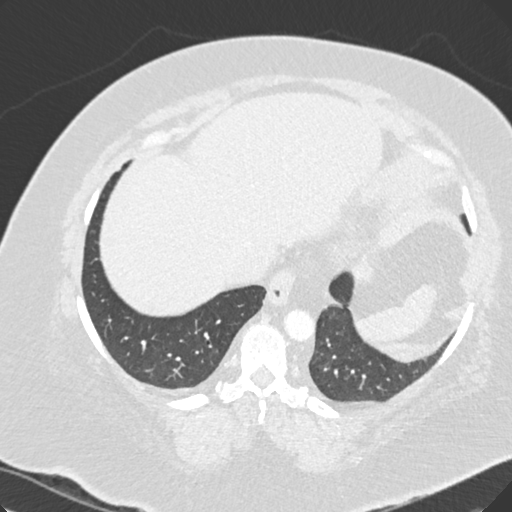
[im 63/277  soft-tissue]
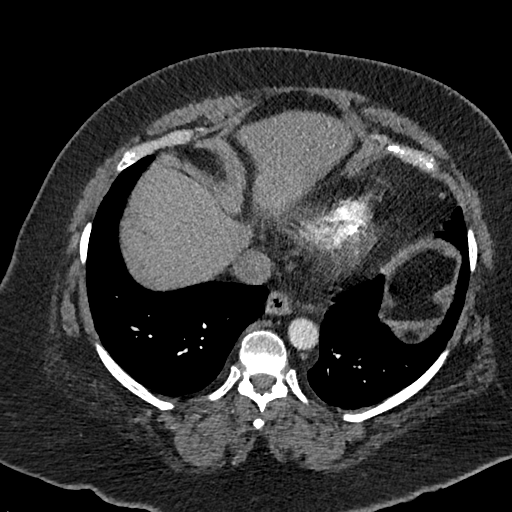
[im 76/277  lung]
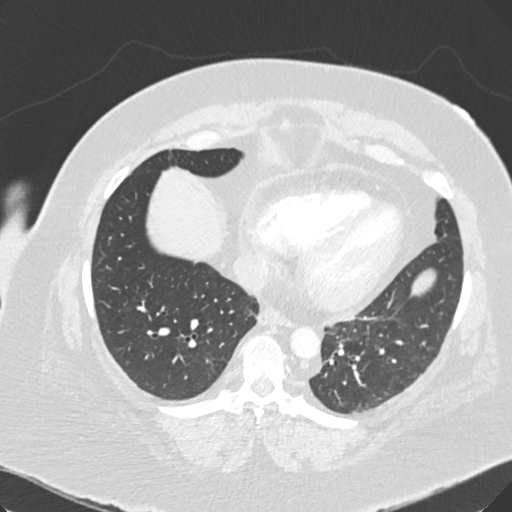
[im 101/277  soft-tissue]
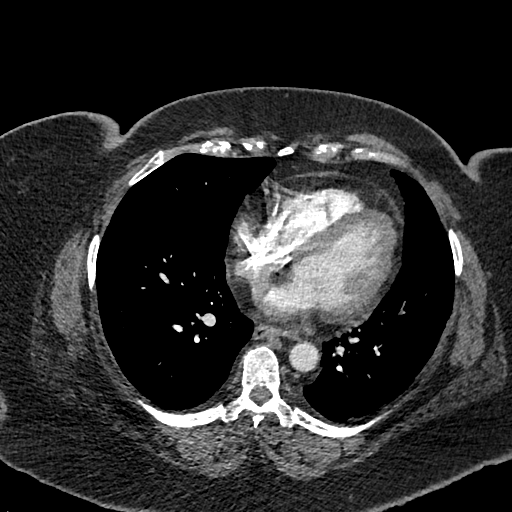
[im 113/277  lung]
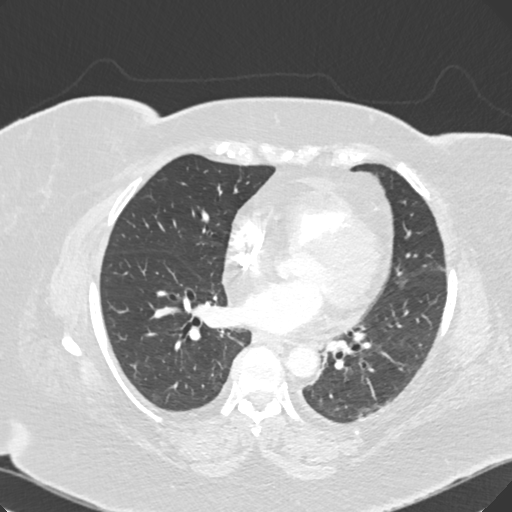
[im 126/277  soft-tissue]
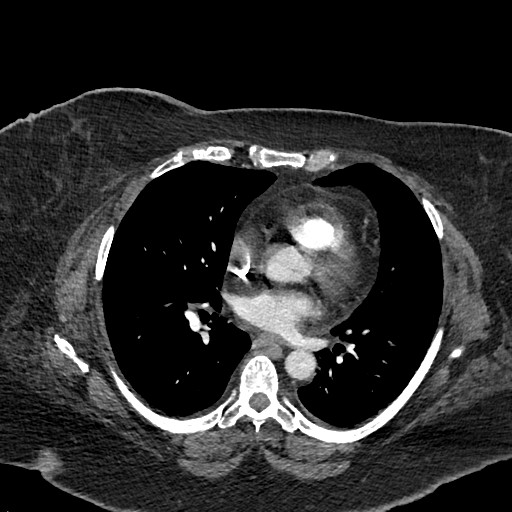
[im 151/277  lung]
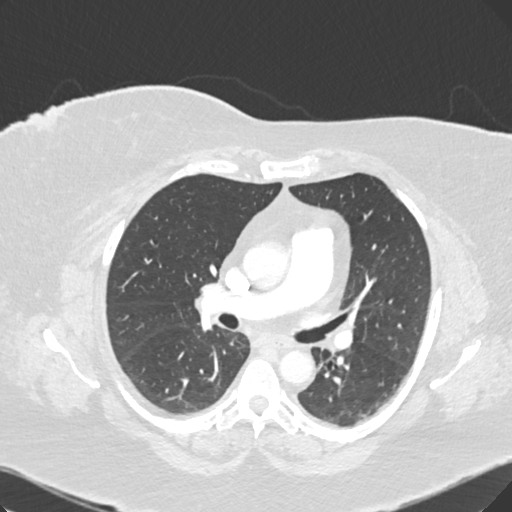
[im 164/277  soft-tissue]
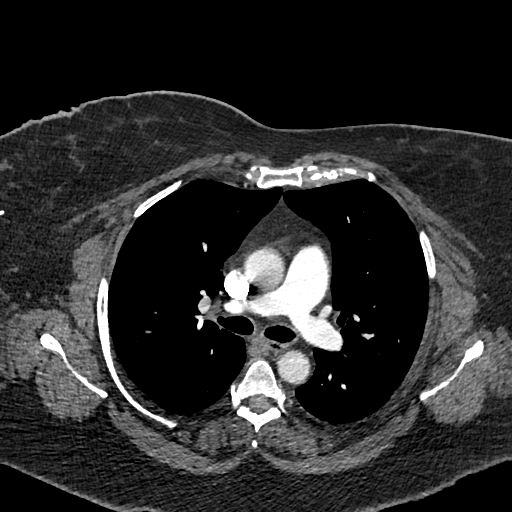
[im 176/277  lung]
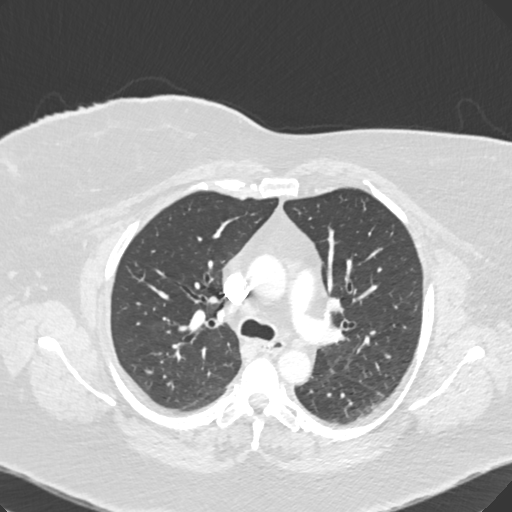
[im 201/277  soft-tissue]
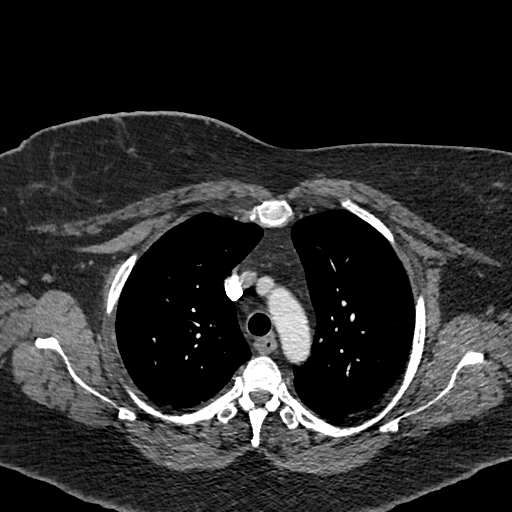
[im 214/277  lung]
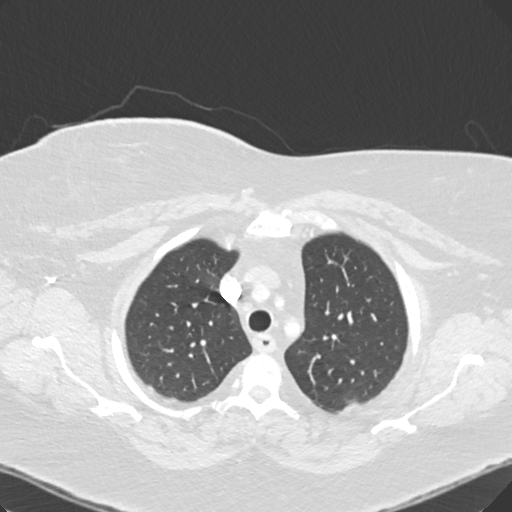
[im 226/277  soft-tissue]
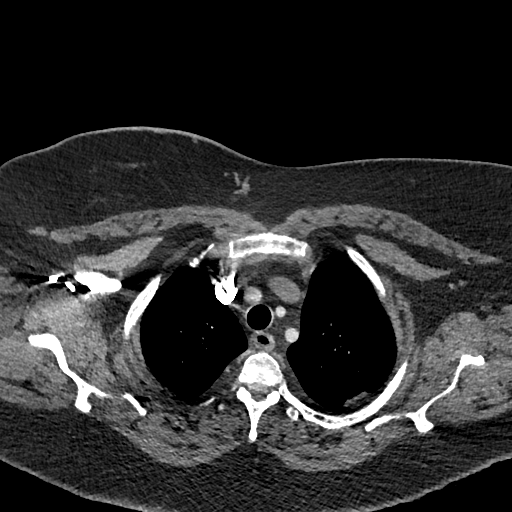
[im 251/277  lung]
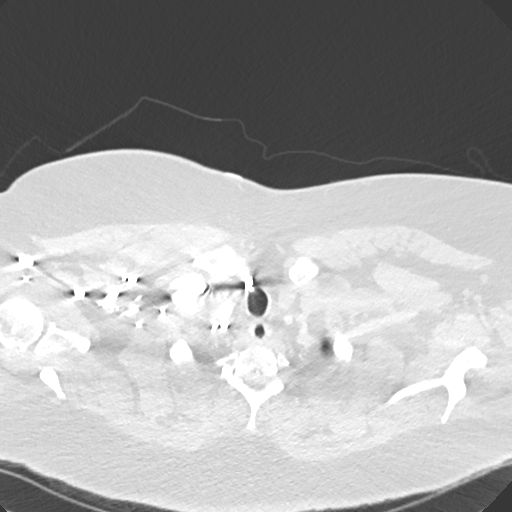
[im 264/277  soft-tissue]
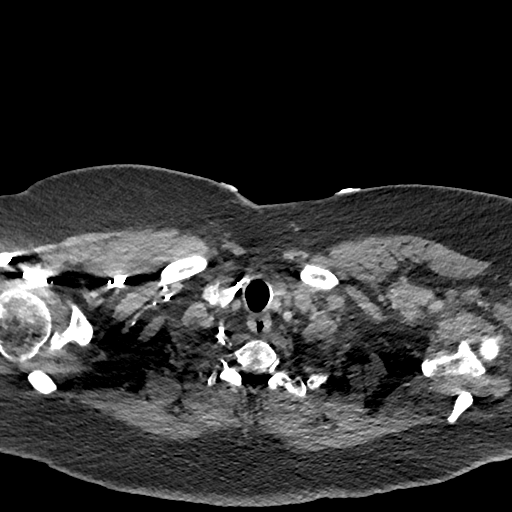

[Series 8: coronal mpr · coronal · 0.59mm/px · 3 of 128 slices shown]
[im 32/128  soft-tissue]
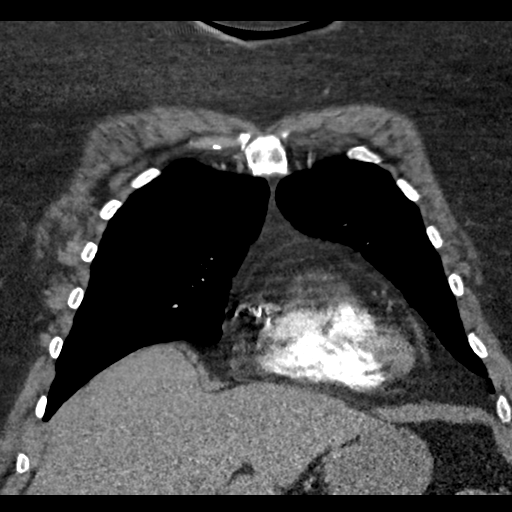
[im 64/128  soft-tissue]
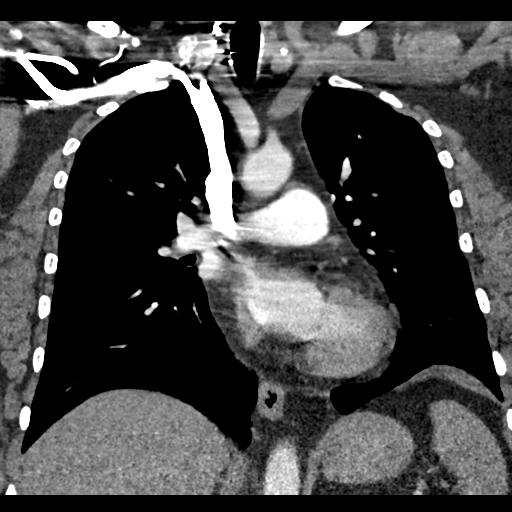
[im 96/128  soft-tissue]
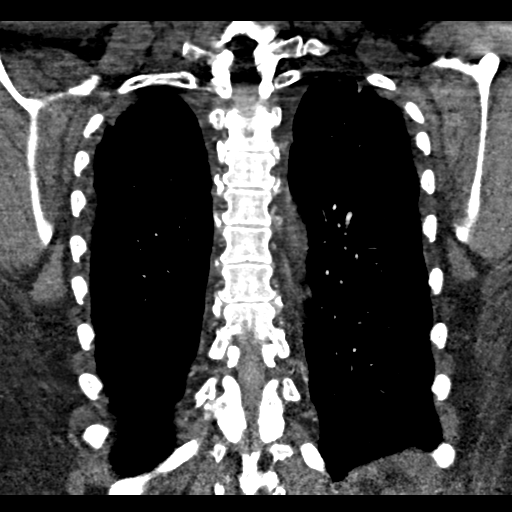

[19 of 46 positions shown; findings below may reference images not displayed]

FINDINGS: The chest wall is unremarkable. No breast masses, supraclavicular or
axillary lymphadenopathy. There are surgical changes from a right
thyroid lobectomy. The bony thorax is intact. No destructive bone
lesions or spinal canal compromise.

The heart is normal in size. No pericardial effusion. Prominent
pericardial and epicardial fat. No mediastinal or hilar mass or
adenopathy. Small scattered nodes are noted. The aorta is normal in
caliber. No dissection. Coronary artery calcifications are noted.
The esophagus is grossly normal.

The pulmonary arterial tree is fairly well opacified. No definite
filling defects to suggest pulmonary embolism.

The lungs are clear.  No pleural effusion.

The upper abdomen is unremarkable.

Review of the MIP images confirms the above findings.
IMPRESSION: No CT findings for pulmonary embolism.

Normal thoracic aorta.

No acute pulmonary findings.

## 2014-09-01 ENCOUNTER — Other Ambulatory Visit: Payer: Medicare Other

## 2014-09-11 ENCOUNTER — Other Ambulatory Visit: Payer: Medicare Other

## 2014-09-12 ENCOUNTER — Encounter (HOSPITAL_COMMUNITY): Payer: Self-pay | Admitting: Emergency Medicine

## 2014-09-12 ENCOUNTER — Emergency Department (HOSPITAL_COMMUNITY)
Admission: EM | Admit: 2014-09-12 | Discharge: 2014-09-12 | Disposition: A | Discharge status: Home / Self Care | Payer: Medicare Other | Attending: Emergency Medicine | Admitting: Emergency Medicine

## 2014-09-12 DIAGNOSIS — E119 Type 2 diabetes mellitus without complications: Secondary | ICD-10-CM | POA: Diagnosis not present

## 2014-09-12 DIAGNOSIS — E669 Obesity, unspecified: Secondary | ICD-10-CM | POA: Insufficient documentation

## 2014-09-12 DIAGNOSIS — Z853 Personal history of malignant neoplasm of breast: Secondary | ICD-10-CM | POA: Insufficient documentation

## 2014-09-12 DIAGNOSIS — Z8701 Personal history of pneumonia (recurrent): Secondary | ICD-10-CM | POA: Diagnosis not present

## 2014-09-12 DIAGNOSIS — E039 Hypothyroidism, unspecified: Secondary | ICD-10-CM | POA: Insufficient documentation

## 2014-09-12 DIAGNOSIS — G8929 Other chronic pain: Secondary | ICD-10-CM | POA: Insufficient documentation

## 2014-09-12 DIAGNOSIS — Z794 Long term (current) use of insulin: Secondary | ICD-10-CM | POA: Diagnosis not present

## 2014-09-12 DIAGNOSIS — E785 Hyperlipidemia, unspecified: Secondary | ICD-10-CM | POA: Diagnosis not present

## 2014-09-12 DIAGNOSIS — Z79899 Other long term (current) drug therapy: Secondary | ICD-10-CM | POA: Diagnosis not present

## 2014-09-12 DIAGNOSIS — Z9981 Dependence on supplemental oxygen: Secondary | ICD-10-CM | POA: Diagnosis not present

## 2014-09-12 DIAGNOSIS — M1990 Unspecified osteoarthritis, unspecified site: Secondary | ICD-10-CM | POA: Insufficient documentation

## 2014-09-12 DIAGNOSIS — K121 Other forms of stomatitis: Secondary | ICD-10-CM | POA: Insufficient documentation

## 2014-09-12 DIAGNOSIS — N189 Chronic kidney disease, unspecified: Secondary | ICD-10-CM | POA: Insufficient documentation

## 2014-09-12 DIAGNOSIS — Z8619 Personal history of other infectious and parasitic diseases: Secondary | ICD-10-CM | POA: Diagnosis not present

## 2014-09-12 DIAGNOSIS — J45909 Unspecified asthma, uncomplicated: Secondary | ICD-10-CM | POA: Diagnosis not present

## 2014-09-12 DIAGNOSIS — Z87442 Personal history of urinary calculi: Secondary | ICD-10-CM | POA: Diagnosis not present

## 2014-09-12 DIAGNOSIS — Z8742 Personal history of other diseases of the female genital tract: Secondary | ICD-10-CM | POA: Insufficient documentation

## 2014-09-12 DIAGNOSIS — K1379 Other lesions of oral mucosa: Secondary | ICD-10-CM | POA: Diagnosis present

## 2014-09-12 DIAGNOSIS — F419 Anxiety disorder, unspecified: Secondary | ICD-10-CM | POA: Insufficient documentation

## 2014-09-12 DIAGNOSIS — Z87891 Personal history of nicotine dependence: Secondary | ICD-10-CM | POA: Diagnosis not present

## 2014-09-12 DIAGNOSIS — I129 Hypertensive chronic kidney disease with stage 1 through stage 4 chronic kidney disease, or unspecified chronic kidney disease: Secondary | ICD-10-CM | POA: Diagnosis not present

## 2014-09-12 DIAGNOSIS — K219 Gastro-esophageal reflux disease without esophagitis: Secondary | ICD-10-CM | POA: Insufficient documentation

## 2014-09-12 DIAGNOSIS — G4733 Obstructive sleep apnea (adult) (pediatric): Secondary | ICD-10-CM | POA: Diagnosis not present

## 2014-09-12 DIAGNOSIS — Z862 Personal history of diseases of the blood and blood-forming organs and certain disorders involving the immune mechanism: Secondary | ICD-10-CM | POA: Diagnosis not present

## 2014-09-12 MED ORDER — LIDOCAINE VISCOUS 2 % MT SOLN: 10.0000 mL | OROMUCOSAL | Status: DC | PRN

## 2014-09-12 MED ORDER — AMOXICILLIN 500 MG PO CAPS: 500.0000 mg | ORAL_CAPSULE | Freq: Three times a day (TID) | ORAL | Status: DC

## 2014-09-12 NOTE — Discharge Instructions (Signed)
Stomatitis Stomatitis is an inflammation of the mucous lining of the mouth. It can affect part of the mouth or the whole mouth. The intensity of symptoms can range from mild to severe. It can affect your cheek, teeth, gums, lips, or tongue. In almost all cases, the lining of the mouth becomes swollen, red, and painful. Painful ulcers can develop in your mouth. Stomatitis recurs in some people. CAUSES  There are many common causes of stomatitis. They include:  Viruses (such as cold sores or shingles).  Canker sores.  Bacteria (such as ulcerative gingivitis or sexually transmitted diseases).  Fungus or yeast (such as candidiasis or oral thrush).  Poor oral hygiene and poor nutrition (Vincent's stomatitis or trench mouth).  Lack of vitamin B, vitamin C, or niacin.  Dentures or braces that do not fit properly.  High acid foods (uncommon).  Sharp or broken teeth.  Cheek biting.  Breathing through the mouth.  Chewing tobacco.  Allergy to toothpaste, mouthwash, candy, gum, lipstick, or some medicines.  Burning your mouth with hot drinks or food.  Exposure to dyes, heavy metals, acid fumes, or mineral dust. SYMPTOMS   Painful ulcers in the mouth.  Blisters in the mouth.  Bleeding gums.  Swollen gums.  Irritability.  Bad breath.  Bad taste in the mouth.  Fever.  Trouble eating because of burning and pain in the mouth. DIAGNOSIS  Your caregiver will examine your mouth and look for bleeding gums and mouth ulcers. Your caregiver may ask you about the medicines you are taking. Your caregiver may suggest a blood test and tissue sample (biopsy) of the mouth ulcer or mass if either is present. This will help find the cause of your condition. TREATMENT  Your treatment will depend on the cause of your condition. Your caregiver will first try to treat your symptoms.   You may be given pain medicine. Topical anesthetic may be used to numb the area if you have severe  pain.  Your caregiver may prescribe antibiotic medicine if you have a bacterial infection.  Your caregiver may prescribe antifungal medicine if you have a fungal infection.  You may need to take antiviral medicine if you have a viral infection like herpes.  You may be asked to use medicated mouth rinses.  Your caregiver will advise you about proper brushing and using a soft toothbrush. You also need to get your teeth cleaned regularly. HOME CARE INSTRUCTIONS   Maintain good oral hygiene. This is especially important for transplant patients.  Brush your teeth carefully with a soft, nylon-bristled toothbrush.  Floss at least 2 times a day.  Clean your mouth after eating.  Rinse your mouth with salt water 3 to 4 times a day.  Gargle with cold water.  Use topical numbing medicines to decrease pain if recommended by your caregiver.  Stop smoking, and stop using chewing or smokeless tobacco.  Avoid eating hot and spicy foods.  Eat soft and bland food.  Reduce your stress wherever possible.  Eat healthy and nutritious foods. SEEK MEDICAL CARE IF:   Your symptoms persist or get worse.  You develop new symptoms.  Your mouth ulcers are present for more than 3 weeks.  Your mouth ulcers come back frequently.  You have increasing difficulty with normal eating and drinking.  You have increasing fatigue or weakness.  You develop loss of appetite or nausea. SEEK IMMEDIATE MEDICAL CARE IF:   You have a fever.  You develop pain, redness, or sores around one or both  eyes.  You cannot eat or drink because of pain or other symptoms.  You develop worsening weakness, or you faint.  You develop vomiting or diarrhea.  You develop chest pain, shortness of breath, or rapid and irregular heartbeats. MAKE SURE YOU:  Understand these instructions.  Will watch your condition.  Will get help right away if you are not doing well or get worse. Document Released: 12/01/2006  Document Revised: 04/28/2011 Document Reviewed: 09/12/2010 [facility] Patient Information 2015 [place], [place]. This information is not intended to replace advice given to you by your health care provider. Make sure you discuss any questions you have with your health care provider.

## 2014-09-12 NOTE — ED Notes (Signed)
Mouth sores started 6 weeks ago, seen by private MD -- given magic mouthwash -- out of it, started with only 1, now has 5 sores in mouth.

## 2014-09-12 NOTE — ED Provider Notes (Signed)
CSN: [id number]     Arrival date & time 09/12/14  1744 History  This chart was scribed for non-physician practitioner, Domenic Moras, PA-C working with Dorie Rank, MD by Tula Nakayama, ED scribe. This patient was seen in room TR05C/TR05C and the patient's care was started at 6:29 PM  Chief Complaint  Patient presents with  . Mouth Lesions   The history is provided by the patient. No language interpreter was used.   HPI Comments: [name] is a 59 y.o. female with an extensive PMHx listed below, who presents to the Emergency Department complaining of constant, gradually worsening mouth sores that started 6 weeks ago. She states bleeding from the lesions as an associated symptom. Pt notes that only 1 lesion was present at the onset, but that she now has at least 5. She has tried [employer] and [employer] with no relief. Pt was evaluated by her PCP at the onset of symptoms who did not tell her the cause of her symptoms, but prescribed the mouthwash. She has run out of treatment, but has not been able to follow-up with her PCP. Pt has a history of breast cancer which was treated with radiation. She stopped cancer treatment 6 months ago due to hot and cold flashes. Pt denies fever and similar lesions in other places.  PCP [name]  Past Medical History  Diagnosis Date  . Depression   . Thyroid disease   . GERD (gastroesophageal reflux disease)   . Pancreatitis     hx of  . Knee pain, bilateral   . Obesity   . HSV (herpes simplex virus) infection   . Hyperlipemia   . Asthma   . Tachycardia     with sx in 2008  . Breast mass in female     L breast 2008, US showed likely fat necrosis  . Anxiety   . Hypertension     sees Dr. Criss Rosales [facility]  . Hx of radiation therapy 05/05/12- 07/15/12    right breast, 45 gray x 25 fx, lumpectomy cavity boosted to 16.2 gray  . History of blood transfusion 2011    "after one of my OR's"  . Hypothyroidism   . 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 Polyneuropathy in diabetes(357.2)   . Benign paroxysmal positional vertigo 12/30/2012  . 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  . OSA on CPAP     pt does not know settings  . Pneumonia 1950's  . Anemia   . Type II diabetes mellitus   . History of hiatal hernia   . History of stomach ulcers   . Daily headache   . Migraines     "~ 3 times/month" (01/06/2014)  . Arthritis     "pretty much all my joints"  . Chronic lower back pain   . Schizophrenia   . Kidney stones   . Chronic kidney disease (CKD)     "lower stage" (01/06/2014)   Past Surgical History  Procedure Laterality Date  . Total knee arthroplasty Bilateral 2009-06/2009    left; right  . Foot fracture surgery Right 1990's  . Shoulder open rotator cuff repair Left 1990's  . Thyroidectomy  1970's  . Joint replacement    . Abdominal hysterectomy  1979?    partial  . Cholecystectomy  1980's?  . Colonoscopy with propofol N/A 09/28/2012    Procedure: COLONOSCOPY WITH PROPOFOL;  Surgeon: Lear Ng, MD;  Location:  [facility];  Service: Endoscopy;  Laterality: N/A;  . Revision total knee arthroplasty  07/2009  . Breast surgery Right 01/2012    "cancer"  . Breast lumpectomy  03/03/2012    Procedure: LUMPECTOMY;  Surgeon: Haywood Lasso, MD;  Location: [facility] OR;  Service: General;  Laterality: Right;   Family History  Problem Relation Age of Onset  . Heart disease Brother     Multiple MIs, starting in his 80s  . Diabetes Brother   . Hypertension Mother   . Diabetes Mother   . Breast cancer Mother 53  . Bone cancer Mother   . Hypertension Sister   . Diabetes Sister   . Breast cancer Sister 25  . Breast cancer Maternal Grandmother   . Heart disease Maternal Grandmother   . Uterine cancer Other 19  . Breast cancer Paternal Aunt 46  . Breast cancer Paternal Grandmother     dx in her 17s  . Prostate cancer Paternal Grandfather    History   Substance Use Topics  . Smoking status: Former Smoker -- 1.00 packs/day for 7 years    Types: Cigarettes    Quit date: 03/24/1992  . Smokeless tobacco: Never Used  . Alcohol Use: Yes     Comment: "stopped drinking in 1996; I was an alcoholic"   OB History    No data available     Review of Systems  Constitutional: Negative for fever.  HENT: Positive for mouth sores.   Skin: Negative for rash.      Allergies  Shellfish allergy; Bee venom; and Morphine and related  Home Medications   Prior to Admission medications   Medication Sig Start Date End Date Taking? Authorizing Provider  aspirin EC 81 MG tablet Take 1 tablet (81 mg total) by mouth daily. Patient taking differently: Take 81 mg by mouth at bedtime.  08/12/13   Sheila Oats, MD  CALCIUM PO Take 1 tablet by mouth daily.     Historical Provider, MD  cholecalciferol (VITAMIN D) 1000 UNITS tablet Take 1 tablet (1,000 Units total) by mouth daily. 11/18/12   Chauncey Cruel, MD  diclofenac sodium (VOLTAREN) 1 % GEL Apply 2 g topically 2 (two) times daily as needed (pain).     Historical Provider, MD  esomeprazole (NEXIUM) 40 MG capsule Take 40 mg by mouth daily before breakfast.    Historical Provider, MD  insulin aspart (NOVOLOG) 100 UNIT/ML injection Inject 0-6 Units into the skin 3 (three) times daily as needed for high blood sugar (200-299 = 4 units, 300 and above = 6 units). Sliding scale    Historical Provider, MD  levothyroxine (SYNTHROID, LEVOTHROID) 175 MCG tablet Take 175 mcg by mouth daily before breakfast.     Historical Provider, MD  LORazepam (ATIVAN) 2 MG tablet Take 1 tablet (2 mg total) by mouth daily as needed for anxiety. 08/16/14   Kinnie Feil, MD  Multiple Vitamin (MULTIVITAMIN WITH MINERALS) TABS Take 1 tablet by mouth daily.    Historical Provider, MD  oxyCODONE (ROXICODONE) 15 MG immediate release tablet Take 1 tablet (15 mg total) by mouth every 8 (eight) hours as needed for pain. 08/16/14    Kinnie Feil, MD  pregabalin (LYRICA) 75 MG capsule Take 2 capsules (150 mg total) by mouth at bedtime. 2 tablets at bedtime 08/16/14   Kinnie Feil, MD  rosuvastatin (CRESTOR) 20 MG tablet Take 20 mg by mouth daily.    Historical Provider, MD  tapentadol (NUCYNTA) 50 MG  TABS tablet Take 200 mg by mouth every 12 (twelve) hours.    Historical Provider, MD  valACYclovir (VALTREX) 1000 MG tablet Take 1,000 mg by mouth daily.    Historical Provider, MD   BP 147/101 mmHg  Pulse 102  Temp(Src) 98.8 F (37.1 C) (Oral)  Resp 18  SpO2 95% Physical Exam  Constitutional: She appears well-developed and well-nourished. No distress.  HENT:  Head: Normocephalic and atraumatic.  1 2 mm ulceration noted to the inferior tongue with whitish appearance 1 1 cm ulceration noted to the anterior gumline communicating with the buccal mucosa that is TTP and having a whitish appearance Throat and uvula midline; no evidence of thrush  Eyes: Conjunctivae and EOM are normal.  Neck: Neck supple. No tracheal deviation present.  Cardiovascular: Normal rate.   Pulmonary/Chest: Effort normal. No respiratory distress.  Skin: Skin is warm and dry.  Psychiatric: She has a normal mood and affect. Her behavior is normal.  Nursing note and vitals reviewed.   ED Course  Procedures   DIAGNOSTIC STUDIES: Oxygen Saturation is 95% on RA, normal by my interpretation.    COORDINATION OF CARE: 6:34 PM Will prescribe viscous lidocaine and Azithromycin. Pt to follow-up with PCP regarding further management of her lesions. Discussed treatment plan with pt at bedside and pt agreed to plan.  Patient with multiple lesions in the mucosa. Symptoms suggestive of stomatitis. Plan to provide Magic mouthwash and have patient follow-up with PCP. She does have history of breast cancer in these lesion were need further evaluation if unresolved. Patient adamantly requesting for antibiotic. Although the lesion does not appears to be  infected, there is a risk that she may develop superimposed infection from open wound. Amoxicillin was prescribed. Patient is aware of risks and benefits of antibiotic. Return precautions discussed.  Labs Review Labs Reviewed - No data to display  Imaging Review No results found.   EKG Interpretation None      MDM   Final diagnoses:  Stomatitis    BP 145/89 mmHg  Pulse 96  Temp(Src) 98.2 F (36.8 C) (Oral)  Resp 22  SpO2 96%   I personally performed the services described in this documentation, which was scribed in my presence. The recorded information has been reviewed and is accurate.      Domenic Moras, PA-C 09/12/14 2236  Dorie Rank, MD 09/12/14 [phone]

## 2014-10-06 ENCOUNTER — Other Ambulatory Visit: Payer: Medicare Other

## 2014-10-20 ENCOUNTER — Other Ambulatory Visit: Payer: Medicare Other

## 2014-10-24 IMAGING — MR MR LUMBAR SPINE W/O CM
4 of 5 series · 26 of 48 positions shown · non-contrast
Comparison: 01/14/2011.

CLINICAL DATA: Low back pain and numbness in both legs.

EXAM:
MRI LUMBAR SPINE WITHOUT CONTRAST
TECHNIQUE: Multiplanar, multisequence MR imaging was performed. No intravenous
contrast was administered.

[Series 4: T1 · sagittal · 4.0mm · 0.55mm/px · 6 of 12 slices shown (1 of 2)]
[im 1/12]
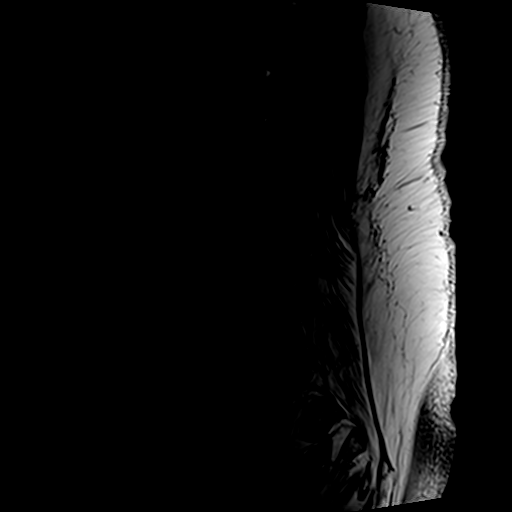
[im 3/12]
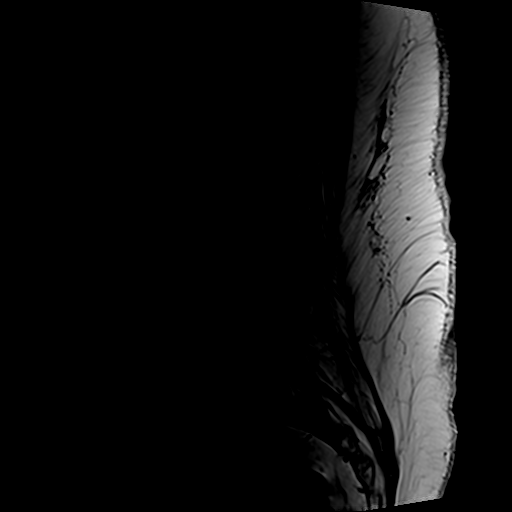
[im 5/12]
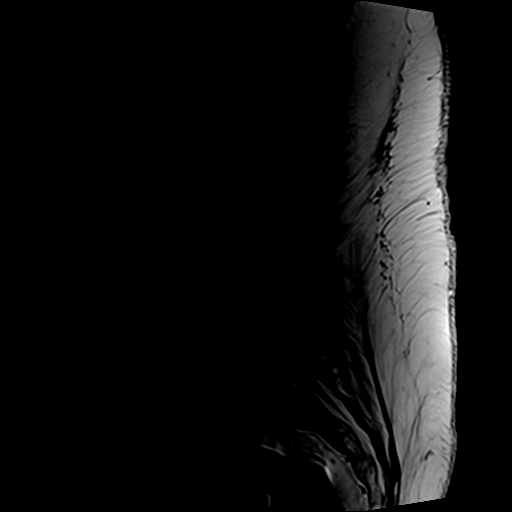
[im 7/12]
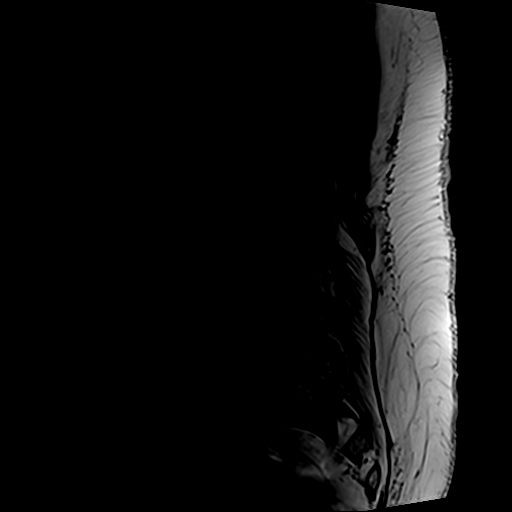
[im 9/12]
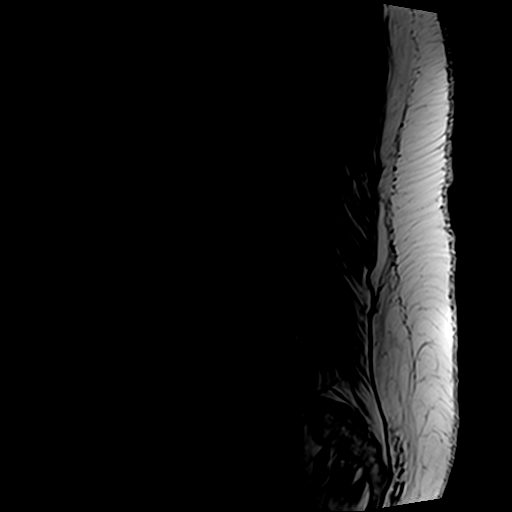
[im 12/12]
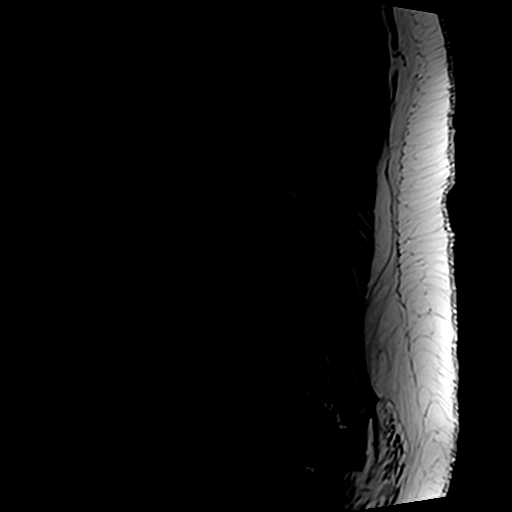

[Series 5: T2 post-contrast · sagittal · 4.0mm · 0.55mm/px · 6 of 12 slices shown]
[im 1/12]
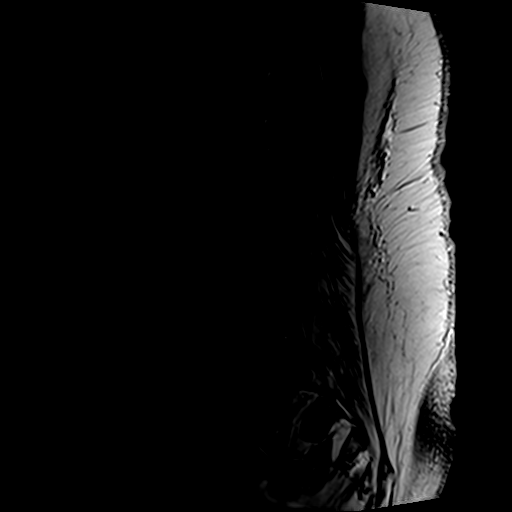
[im 3/12]
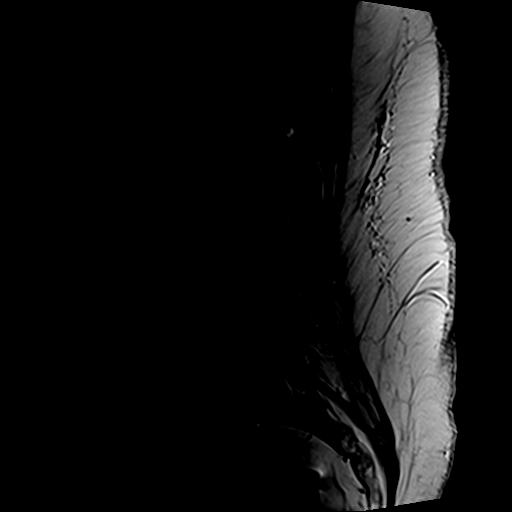
[im 5/12]
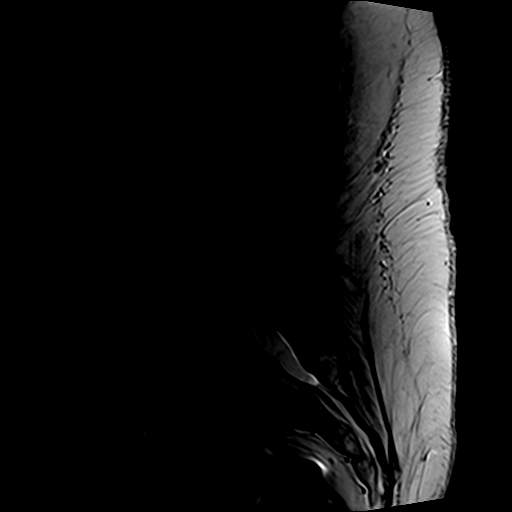
[im 7/12]
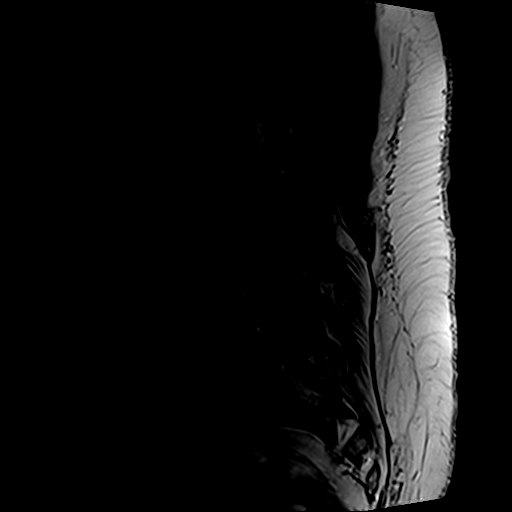
[im 9/12]
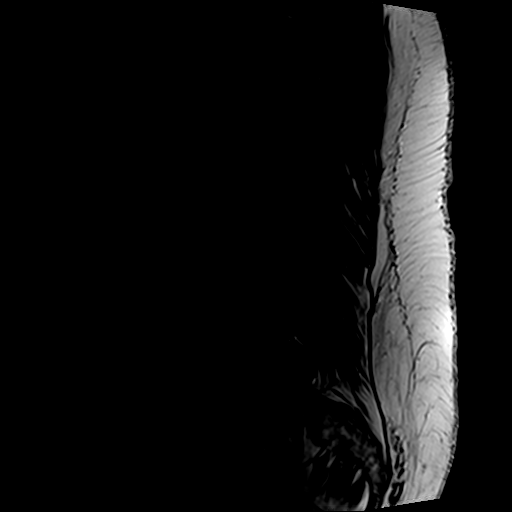
[im 12/12]
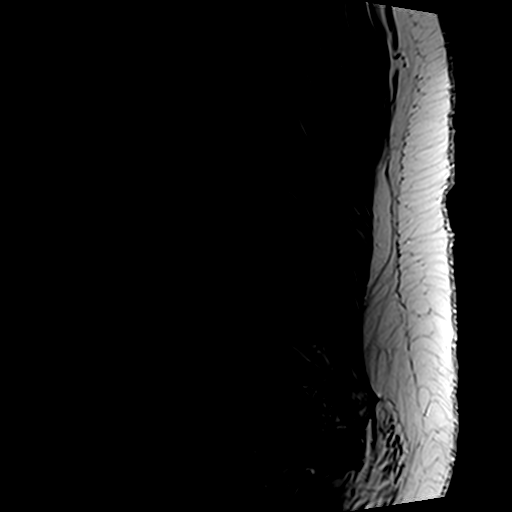

[Series 6: T2 · axial · 4.0mm · 0.70mm/px · z∈[-66,+111]mm · 9 of 31 slices shown]
[im 1/31]
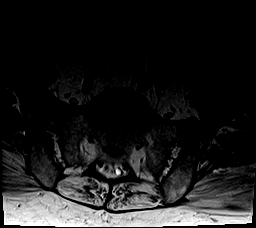
[im 5/31]
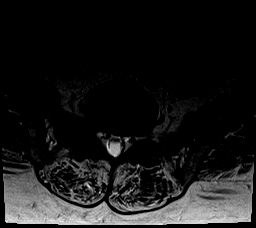
[im 9/31]
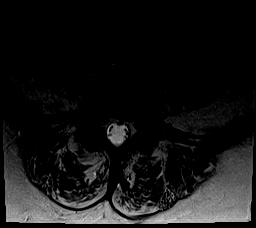
[im 13/31]
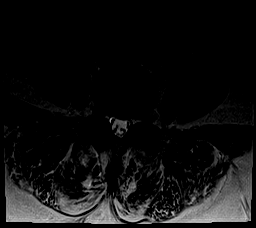
[im 16/31]
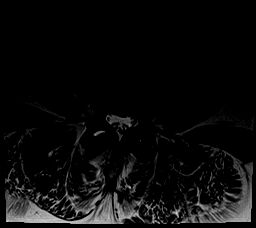
[im 18/31]
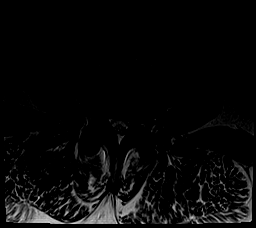
[im 22/31]
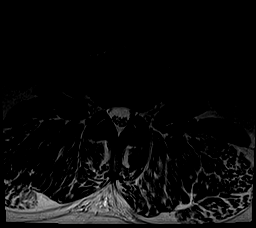
[im 26/31]
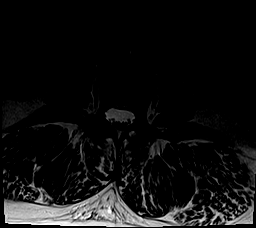
[im 31/31]
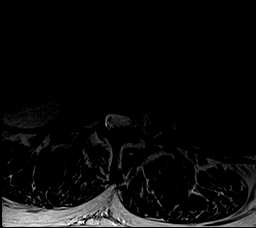

[Series 7: T1 · axial · 4.0mm · 0.35mm/px · z∈[-66,+86]mm · 5 of 31 slices shown (2 of 2)]
[im 1/31]
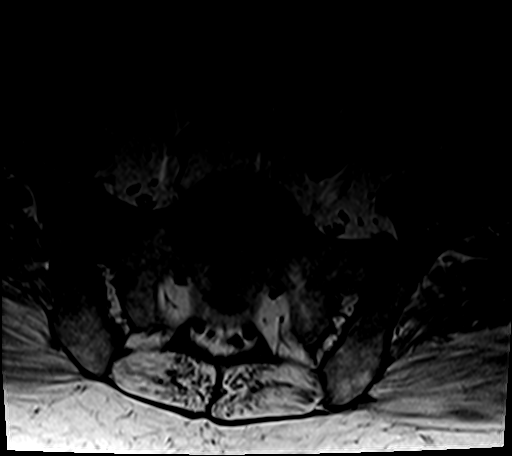
[im 5/31]
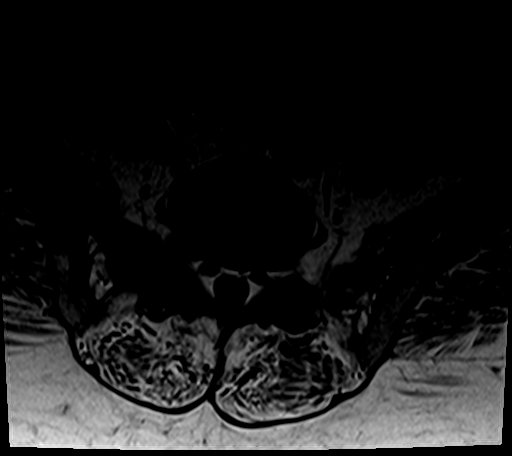
[im 9/31]
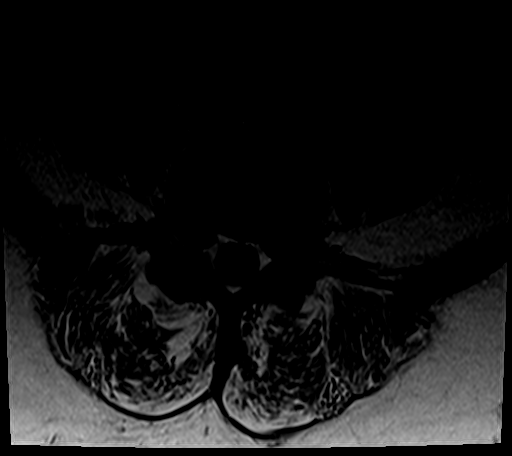
[im 16/31]
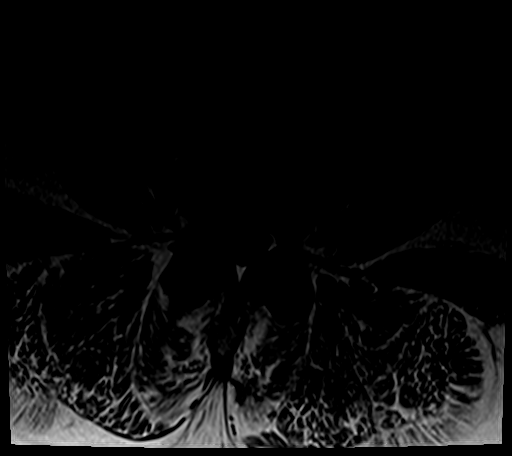
[im 26/31]
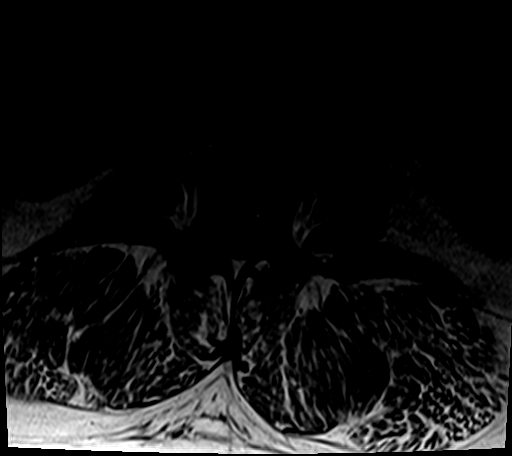

[26 of 48 positions shown; findings below may reference images not displayed]

FINDINGS: Stable alignment of the lumbar vertebral bodies. They demonstrate
normal marrow signal. The conus medullaris terminates at T12-L1.
Advanced facet disease but no definite pars defects. No significant
paraspinal or retroperitoneal process is identified.

L1-2:  No significant findings.

L2-3:  No significant findings.  Mild facet disease.

L3-4: Diffuse annular bulge, short pedicles and facet disease
contribute to early spinal stenosis and mild bilateral lateral
recess stenosis. These findings appear relatively stable.

L4-5: Diffuse bulging annulus, short pedicles and advanced facet
disease contribute to mild bilateral lateral recess stenosis and
mild bilateral foraminal encroachment. These findings are relatively
stable.

L5-S1: Shallow central disc protrusion slightly asymmetrical left.
There is mild mass effect on the left S1 nerve root and possible
irritation of the right S1 nerve root. The exiting L5 nerve roots
appear normal. Stable facet disease.
IMPRESSION: Bulging discs and advanced facet disease at L3-4 and L4-5
contributing to early spinal stenosis and mild bilateral lateral
recess stenosis.

Focal disc protrusion at L5-S1 possibly irritating both S1 nerve
roots, left greater than right.

## 2014-10-27 ENCOUNTER — Emergency Department (HOSPITAL_COMMUNITY): Payer: Medicare Other

## 2014-10-27 ENCOUNTER — Encounter (HOSPITAL_COMMUNITY): Payer: Self-pay

## 2014-10-27 ENCOUNTER — Ambulatory Visit
Admission: RE | Admit: 2014-10-27 | Discharge: 2014-10-27 | Disposition: A | Discharge status: Home / Self Care | Payer: Medicare Other | Source: Ambulatory Visit | Attending: Nurse Practitioner | Admitting: Nurse Practitioner

## 2014-10-27 ENCOUNTER — Inpatient Hospital Stay (HOSPITAL_COMMUNITY)
Admission: EM | Admit: 2014-10-27 | Discharge: 2014-10-30 | DRG: 315 | Disposition: A | Discharge status: Home / Self Care | Payer: Medicare Other | Attending: Internal Medicine | Admitting: Internal Medicine

## 2014-10-27 DIAGNOSIS — D631 Anemia in chronic kidney disease: Secondary | ICD-10-CM | POA: Diagnosis present

## 2014-10-27 DIAGNOSIS — G8929 Other chronic pain: Secondary | ICD-10-CM | POA: Diagnosis present

## 2014-10-27 DIAGNOSIS — E1142 Type 2 diabetes mellitus with diabetic polyneuropathy: Secondary | ICD-10-CM | POA: Diagnosis present

## 2014-10-27 DIAGNOSIS — C50411 Malignant neoplasm of upper-outer quadrant of right female breast: Secondary | ICD-10-CM

## 2014-10-27 DIAGNOSIS — Z923 Personal history of irradiation: Secondary | ICD-10-CM

## 2014-10-27 DIAGNOSIS — F419 Anxiety disorder, unspecified: Secondary | ICD-10-CM | POA: Diagnosis present

## 2014-10-27 DIAGNOSIS — R51 Headache: Secondary | ICD-10-CM

## 2014-10-27 DIAGNOSIS — F329 Major depressive disorder, single episode, unspecified: Secondary | ICD-10-CM | POA: Diagnosis present

## 2014-10-27 DIAGNOSIS — G219 Secondary parkinsonism, unspecified: Secondary | ICD-10-CM | POA: Diagnosis present

## 2014-10-27 DIAGNOSIS — Z8249 Family history of ischemic heart disease and other diseases of the circulatory system: Secondary | ICD-10-CM

## 2014-10-27 DIAGNOSIS — K219 Gastro-esophageal reflux disease without esophagitis: Secondary | ICD-10-CM | POA: Diagnosis present

## 2014-10-27 DIAGNOSIS — G4733 Obstructive sleep apnea (adult) (pediatric): Secondary | ICD-10-CM | POA: Diagnosis present

## 2014-10-27 DIAGNOSIS — R55 Syncope and collapse: Secondary | ICD-10-CM | POA: Diagnosis present

## 2014-10-27 DIAGNOSIS — I129 Hypertensive chronic kidney disease with stage 1 through stage 4 chronic kidney disease, or unspecified chronic kidney disease: Secondary | ICD-10-CM | POA: Diagnosis present

## 2014-10-27 DIAGNOSIS — N183 Chronic kidney disease, stage 3 (moderate): Secondary | ICD-10-CM | POA: Diagnosis present

## 2014-10-27 DIAGNOSIS — G459 Transient cerebral ischemic attack, unspecified: Secondary | ICD-10-CM | POA: Insufficient documentation

## 2014-10-27 DIAGNOSIS — F209 Schizophrenia, unspecified: Secondary | ICD-10-CM | POA: Diagnosis present

## 2014-10-27 DIAGNOSIS — E785 Hyperlipidemia, unspecified: Secondary | ICD-10-CM | POA: Diagnosis present

## 2014-10-27 DIAGNOSIS — I959 Hypotension, unspecified: Principal | ICD-10-CM | POA: Diagnosis present

## 2014-10-27 DIAGNOSIS — R519 Headache, unspecified: Secondary | ICD-10-CM

## 2014-10-27 DIAGNOSIS — D509 Iron deficiency anemia, unspecified: Secondary | ICD-10-CM | POA: Diagnosis present

## 2014-10-27 DIAGNOSIS — R000 Tachycardia, unspecified: Secondary | ICD-10-CM | POA: Diagnosis present

## 2014-10-27 DIAGNOSIS — M545 Low back pain, unspecified: Secondary | ICD-10-CM | POA: Diagnosis present

## 2014-10-27 DIAGNOSIS — E039 Hypothyroidism, unspecified: Secondary | ICD-10-CM | POA: Diagnosis present

## 2014-10-27 DIAGNOSIS — Z96653 Presence of artificial knee joint, bilateral: Secondary | ICD-10-CM | POA: Diagnosis present

## 2014-10-27 DIAGNOSIS — D638 Anemia in other chronic diseases classified elsewhere: Secondary | ICD-10-CM | POA: Diagnosis present

## 2014-10-27 DIAGNOSIS — Z91030 Bee allergy status: Secondary | ICD-10-CM

## 2014-10-27 DIAGNOSIS — Z885 Allergy status to narcotic agent status: Secondary | ICD-10-CM

## 2014-10-27 DIAGNOSIS — Z803 Family history of malignant neoplasm of breast: Secondary | ICD-10-CM

## 2014-10-27 DIAGNOSIS — Z87891 Personal history of nicotine dependence: Secondary | ICD-10-CM

## 2014-10-27 DIAGNOSIS — D649 Anemia, unspecified: Secondary | ICD-10-CM | POA: Diagnosis present

## 2014-10-27 DIAGNOSIS — Z794 Long term (current) use of insulin: Secondary | ICD-10-CM

## 2014-10-27 DIAGNOSIS — I5032 Chronic diastolic (congestive) heart failure: Secondary | ICD-10-CM | POA: Diagnosis present

## 2014-10-27 DIAGNOSIS — C50919 Malignant neoplasm of unspecified site of unspecified female breast: Secondary | ICD-10-CM | POA: Diagnosis present

## 2014-10-27 DIAGNOSIS — Z79891 Long term (current) use of opiate analgesic: Secondary | ICD-10-CM

## 2014-10-27 DIAGNOSIS — E119 Type 2 diabetes mellitus without complications: Secondary | ICD-10-CM

## 2014-10-27 DIAGNOSIS — E1122 Type 2 diabetes mellitus with diabetic chronic kidney disease: Secondary | ICD-10-CM | POA: Diagnosis present

## 2014-10-27 DIAGNOSIS — H8110 Benign paroxysmal vertigo, unspecified ear: Secondary | ICD-10-CM | POA: Diagnosis present

## 2014-10-27 DIAGNOSIS — Z9989 Dependence on other enabling machines and devices: Secondary | ICD-10-CM

## 2014-10-27 DIAGNOSIS — Z79899 Other long term (current) drug therapy: Secondary | ICD-10-CM

## 2014-10-27 DIAGNOSIS — Z91013 Allergy to seafood: Secondary | ICD-10-CM

## 2014-10-27 DIAGNOSIS — Z6841 Body Mass Index (BMI) 40.0 and over, adult: Secondary | ICD-10-CM

## 2014-10-27 DIAGNOSIS — E861 Hypovolemia: Secondary | ICD-10-CM | POA: Diagnosis present

## 2014-10-27 DIAGNOSIS — Z7982 Long term (current) use of aspirin: Secondary | ICD-10-CM

## 2014-10-27 LAB — CBC WITH DIFFERENTIAL/PLATELET
BASOS PCT: 0 % (ref 0–1)
Basophils Absolute: 0.1 10*3/uL (ref 0.0–0.1)
EOS ABS: 0.2 10*3/uL (ref 0.0–0.7)
EOS PCT: 2 % (ref 0–5)
HCT: 29.3 % — ABNORMAL LOW (ref 36.0–46.0)
Hemoglobin: 9.7 g/dL — ABNORMAL LOW (ref 12.0–15.0)
LYMPHS ABS: 3 10*3/uL (ref 0.7–4.0)
Lymphocytes Relative: 23 % (ref 12–46)
MCH: 27.6 pg (ref 26.0–34.0)
MCHC: 33.1 g/dL (ref 30.0–36.0)
MCV: 83.2 fL (ref 78.0–100.0)
MONOS PCT: 4 % (ref 3–12)
Monocytes Absolute: 0.6 10*3/uL (ref 0.1–1.0)
Neutro Abs: 9.4 10*3/uL — ABNORMAL HIGH (ref 1.7–7.7)
Neutrophils Relative %: 71 % (ref 43–77)
PLATELETS: 255 10*3/uL (ref 150–400)
RBC: 3.52 MIL/uL — ABNORMAL LOW (ref 3.87–5.11)
RDW: 17.1 % — ABNORMAL HIGH (ref 11.5–15.5)
WBC: 13.3 10*3/uL — AB (ref 4.0–10.5)

## 2014-10-27 LAB — I-STAT TROPONIN, ED: Troponin i, poc: 0 ng/mL (ref 0.00–0.08)

## 2014-10-27 LAB — COMPREHENSIVE METABOLIC PANEL
ALK PHOS: 52 U/L (ref 38–126)
ALT: 14 U/L (ref 14–54)
AST: 14 U/L — AB (ref 15–41)
Albumin: 2.5 g/dL — ABNORMAL LOW (ref 3.5–5.0)
Anion gap: 7 (ref 5–15)
BUN: 24 mg/dL — AB (ref 6–20)
CALCIUM: 7.8 mg/dL — AB (ref 8.9–10.3)
CHLORIDE: 110 mmol/L (ref 101–111)
CO2: 20 mmol/L — AB (ref 22–32)
CREATININE: 1.31 mg/dL — AB (ref 0.44–1.00)
GFR calc Af Amer: 51 mL/min — ABNORMAL LOW (ref >60)
GFR, EST NON AFRICAN AMERICAN: 44 mL/min — AB (ref >60)
Glucose, Bld: 99 mg/dL (ref 65–99)
Potassium: 3.5 mmol/L (ref 3.5–5.1)
Sodium: 137 mmol/L (ref 135–145)
Total Bilirubin: 0.2 mg/dL — ABNORMAL LOW (ref 0.3–1.2)
Total Protein: 5.1 g/dL — ABNORMAL LOW (ref 6.5–8.1)

## 2014-10-27 LAB — I-STAT CG4 LACTIC ACID, ED: LACTIC ACID, VENOUS: 1.05 mmol/L (ref 0.5–2.0)

## 2014-10-27 LAB — PROTIME-INR
INR: 1.09 (ref 0.00–1.49)
PROTHROMBIN TIME: 14.3 s (ref 11.6–15.2)

## 2014-10-27 MED ORDER — SODIUM CHLORIDE 0.9 % IV BOLUS (SEPSIS)
1000.0000 mL | Freq: Once | INTRAVENOUS | Status: AC
  Administered 2014-10-27: 1000 mL via INTRAVENOUS

## 2014-10-27 MED ORDER — PIPERACILLIN-TAZOBACTAM 3.375 G IVPB
3.3750 g | Freq: Three times a day (TID) | INTRAVENOUS | Status: DC
  Filled 2014-10-27 (×2): qty 50

## 2014-10-27 MED ORDER — VANCOMYCIN HCL 10 G IV SOLR
2000.0000 mg | Freq: Once | INTRAVENOUS | Status: AC
  Administered 2014-10-27: 2000 mg via INTRAVENOUS
  Filled 2014-10-27: qty 2000

## 2014-10-27 MED ORDER — PIPERACILLIN-TAZOBACTAM 3.375 G IVPB 30 MIN
3.3750 g | Freq: Once | INTRAVENOUS | Status: AC
  Administered 2014-10-27: 3.375 g via INTRAVENOUS

## 2014-10-27 NOTE — ED Notes (Signed)
X-ray bedside.

## 2014-10-27 NOTE — ED Notes (Signed)
Per EMS, pt complains of generalized weakness x 2 weeks. Pt states that 1 week ago pt started having nausea and headache, pt tried to eat earlier today and vomited. Pt denies uti sx and diarrhea. Pt denies recent infection. Pt cool to touch. Pt was sinus tach 106, BP 86/52, RR 14, 97% on RA, pt stated she felt sob so she was placed on o2, etco2 36. Pt has been difficult to arouse with family x 2 weeks. Pt has been trying to get in to see her pcp but has not been able to. Pt alert and oriented x 4.

## 2014-10-27 NOTE — Consult Note (Cosign Needed)
[facility] for :   Vancomycin, Zosyn Indication:  Sepsis  Hospital Problems: Sepsis  Allergies: Allergies  Allergen Reactions  . Shellfish Allergy Shortness Of Breath  . Bee Venom Hives  . Morphine And Related Other (See Comments)    Overly sedated    Patient Measurements: Height: _0  (165.1 cm) Weight: 280 lb (127.007 kg) IBW/kg (Calculated) : 57  Vancomycin Dosing Weight:  127 kg    Vital Signs: Temp: 98 F (36.7 C) (09/09 2207) Temp Source: Oral (09/09 2207) BP: 91/50 mmHg (09/09 2230) Pulse Rate: 100 (09/09 2230)  Labs:  Recent Labs  10/27/14 2223  WBC 13.3*  HGB 9.7*  PLT 255    Recent Labs Lab 10/27/14 2223 10/27/14 2233  WBC 13.3*  --   LATICACIDVEN  --  1.05    CrCl pending.  Microbiology: No results found for this or any previous visit (from the past 720 hour(s)).  Medical/Surgical History: Past Medical History  Diagnosis Date  . Depression   . Thyroid disease   . GERD (gastroesophageal reflux disease)   . Pancreatitis     hx of  . Knee pain, bilateral   . Obesity   . HSV (herpes simplex virus) infection   . Hyperlipemia   . Asthma   . Tachycardia     with sx in 2008  . Breast mass in female     L breast 2008, US showed likely fat necrosis  . Anxiety   . Hypertension     sees Dr. Criss Rosales , [facility]  . Hx of radiation therapy 05/05/12- 07/15/12    right breast, 45 gray x 25 fx, lumpectomy cavity boosted to 16.2 gray  . History of blood transfusion 2011    "after one of my OR's"  . Hypothyroidism   . 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 Polyneuropathy in diabetes(357.2)   . Benign paroxysmal positional vertigo 12/30/2012  . 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  . OSA on CPAP     pt does not know settings  . Pneumonia 1950's  . Anemia   . Type II diabetes mellitus   . History of hiatal hernia   . History of stomach ulcers   . Daily  headache   . Migraines     "~ 3 times/month" (01/06/2014)  . Arthritis     "pretty much all my joints"  . Chronic lower back pain   . Schizophrenia   . Kidney stones   . Chronic kidney disease (CKD)     "lower stage" (01/06/2014)   Past Surgical History  Procedure Laterality Date  . Total knee arthroplasty Bilateral 2009-06/2009    left; right  . Foot fracture surgery Right 1990's  . Shoulder open rotator cuff repair Left 1990's  . Thyroidectomy  1970's  . Joint replacement    . Abdominal hysterectomy  1979?    partial  . Cholecystectomy  1980's?  . Colonoscopy with propofol N/A 09/28/2012    Procedure: COLONOSCOPY WITH PROPOFOL;  Surgeon: Lear Ng, MD;  Location: [facility];  Service: Endoscopy;  Laterality: N/A;  . Revision total knee arthroplasty  07/2009  . Breast surgery Right 01/2012    "cancer"  . Breast lumpectomy  03/03/2012    Procedure: LUMPECTOMY;  Surgeon: Haywood Lasso, MD;  Location: [place];  Service: General;  Laterality: Right;    Current Medication[s] Include:  Infusion[s]: Infusions:  .  piperacillin-tazobactam    . vancomycin     Antibiotic[s]: Anti-infectives    Start     Dose/Rate Route Frequency Ordered Stop   10/28/14 0800  piperacillin-tazobactam (ZOSYN) IVPB 3.375 g     3.375 g 12.5 mL/hr over 240 Minutes Intravenous Every 8 hours 10/27/14 2250     10/27/14 2230  piperacillin-tazobactam (ZOSYN) IVPB 3.375 g     3.375 g 100 mL/hr over 30 Minutes Intravenous  Once 10/27/14 2223     10/27/14 2230  vancomycin (VANCOCIN) 2,000 mg in sodium chloride 0.9 % 500 mL IVPB     2,000 mg 250 mL/hr over 120 Minutes Intravenous  Once 10/27/14 2223       Assessment:  59 y/o female with 2 week history of weakness, 1 week history of nausea and headache.  Patient admitted for presumed sepsis.  Zosyn and Vancomycin ordered per Rx consult.  Renal function pending.  Goal of Therapy:   Vancomycin trough level 15-20 mcg/ml  Zosyn dosed for  clinical indication and adjusted for renal function as required.  Plan:  1. Vancomycin 2000 mg IV x 1.  Schedule Vancomycin maintenance doses pending renal function evaluation. 2. Continue Zosyn 3.375 gm IV q 8 hours, each dose to infuse over 4 hours 3. Monitor renal function, WBC, fever curve, any cultures/sensitivities, Vancomycin levels as clinically indicated, length of therapy, and follow clinical progression.  Marthenia Rolling,  Pharm.D   10/27/2014,  11:00 PM

## 2014-10-28 ENCOUNTER — Inpatient Hospital Stay (HOSPITAL_COMMUNITY): Payer: Medicare Other

## 2014-10-28 ENCOUNTER — Encounter (HOSPITAL_COMMUNITY): Payer: Self-pay | Admitting: Cardiology

## 2014-10-28 DIAGNOSIS — Z8249 Family history of ischemic heart disease and other diseases of the circulatory system: Secondary | ICD-10-CM | POA: Diagnosis not present

## 2014-10-28 DIAGNOSIS — E785 Hyperlipidemia, unspecified: Secondary | ICD-10-CM | POA: Diagnosis present

## 2014-10-28 DIAGNOSIS — Z803 Family history of malignant neoplasm of breast: Secondary | ICD-10-CM | POA: Diagnosis not present

## 2014-10-28 DIAGNOSIS — R51 Headache: Secondary | ICD-10-CM | POA: Diagnosis present

## 2014-10-28 DIAGNOSIS — Z91013 Allergy to seafood: Secondary | ICD-10-CM | POA: Diagnosis not present

## 2014-10-28 DIAGNOSIS — I959 Hypotension, unspecified: Secondary | ICD-10-CM | POA: Diagnosis present

## 2014-10-28 DIAGNOSIS — G4733 Obstructive sleep apnea (adult) (pediatric): Secondary | ICD-10-CM | POA: Diagnosis present

## 2014-10-28 DIAGNOSIS — Z87891 Personal history of nicotine dependence: Secondary | ICD-10-CM | POA: Diagnosis not present

## 2014-10-28 DIAGNOSIS — Z794 Long term (current) use of insulin: Secondary | ICD-10-CM | POA: Diagnosis not present

## 2014-10-28 DIAGNOSIS — Z79899 Other long term (current) drug therapy: Secondary | ICD-10-CM | POA: Diagnosis not present

## 2014-10-28 DIAGNOSIS — G8929 Other chronic pain: Secondary | ICD-10-CM | POA: Diagnosis present

## 2014-10-28 DIAGNOSIS — E1142 Type 2 diabetes mellitus with diabetic polyneuropathy: Secondary | ICD-10-CM | POA: Diagnosis present

## 2014-10-28 DIAGNOSIS — Z96653 Presence of artificial knee joint, bilateral: Secondary | ICD-10-CM | POA: Diagnosis present

## 2014-10-28 DIAGNOSIS — I951 Orthostatic hypotension: Secondary | ICD-10-CM | POA: Diagnosis not present

## 2014-10-28 DIAGNOSIS — F209 Schizophrenia, unspecified: Secondary | ICD-10-CM | POA: Diagnosis present

## 2014-10-28 DIAGNOSIS — I9589 Other hypotension: Secondary | ICD-10-CM

## 2014-10-28 DIAGNOSIS — E039 Hypothyroidism, unspecified: Secondary | ICD-10-CM | POA: Diagnosis present

## 2014-10-28 DIAGNOSIS — D500 Iron deficiency anemia secondary to blood loss (chronic): Secondary | ICD-10-CM

## 2014-10-28 DIAGNOSIS — F329 Major depressive disorder, single episode, unspecified: Secondary | ICD-10-CM | POA: Diagnosis present

## 2014-10-28 DIAGNOSIS — G219 Secondary parkinsonism, unspecified: Secondary | ICD-10-CM | POA: Diagnosis present

## 2014-10-28 DIAGNOSIS — F419 Anxiety disorder, unspecified: Secondary | ICD-10-CM | POA: Diagnosis present

## 2014-10-28 DIAGNOSIS — D649 Anemia, unspecified: Secondary | ICD-10-CM | POA: Diagnosis present

## 2014-10-28 DIAGNOSIS — Z923 Personal history of irradiation: Secondary | ICD-10-CM | POA: Diagnosis not present

## 2014-10-28 DIAGNOSIS — I471 Supraventricular tachycardia: Secondary | ICD-10-CM

## 2014-10-28 DIAGNOSIS — N183 Chronic kidney disease, stage 3 (moderate): Secondary | ICD-10-CM | POA: Diagnosis present

## 2014-10-28 DIAGNOSIS — G459 Transient cerebral ischemic attack, unspecified: Secondary | ICD-10-CM | POA: Diagnosis not present

## 2014-10-28 DIAGNOSIS — D638 Anemia in other chronic diseases classified elsewhere: Secondary | ICD-10-CM | POA: Diagnosis present

## 2014-10-28 DIAGNOSIS — D631 Anemia in chronic kidney disease: Secondary | ICD-10-CM | POA: Diagnosis present

## 2014-10-28 DIAGNOSIS — I129 Hypertensive chronic kidney disease with stage 1 through stage 4 chronic kidney disease, or unspecified chronic kidney disease: Secondary | ICD-10-CM | POA: Diagnosis present

## 2014-10-28 DIAGNOSIS — I5032 Chronic diastolic (congestive) heart failure: Secondary | ICD-10-CM | POA: Diagnosis present

## 2014-10-28 DIAGNOSIS — H8110 Benign paroxysmal vertigo, unspecified ear: Secondary | ICD-10-CM | POA: Diagnosis present

## 2014-10-28 DIAGNOSIS — Z6841 Body Mass Index (BMI) 40.0 and over, adult: Secondary | ICD-10-CM | POA: Diagnosis not present

## 2014-10-28 DIAGNOSIS — Z885 Allergy status to narcotic agent status: Secondary | ICD-10-CM | POA: Diagnosis not present

## 2014-10-28 DIAGNOSIS — M545 Low back pain: Secondary | ICD-10-CM

## 2014-10-28 DIAGNOSIS — Z7982 Long term (current) use of aspirin: Secondary | ICD-10-CM | POA: Diagnosis not present

## 2014-10-28 DIAGNOSIS — E1169 Type 2 diabetes mellitus with other specified complication: Secondary | ICD-10-CM

## 2014-10-28 DIAGNOSIS — R55 Syncope and collapse: Secondary | ICD-10-CM | POA: Diagnosis present

## 2014-10-28 DIAGNOSIS — E1122 Type 2 diabetes mellitus with diabetic chronic kidney disease: Secondary | ICD-10-CM | POA: Diagnosis present

## 2014-10-28 DIAGNOSIS — Z9989 Dependence on other enabling machines and devices: Secondary | ICD-10-CM

## 2014-10-28 DIAGNOSIS — Z91030 Bee allergy status: Secondary | ICD-10-CM | POA: Diagnosis not present

## 2014-10-28 DIAGNOSIS — E861 Hypovolemia: Secondary | ICD-10-CM | POA: Diagnosis present

## 2014-10-28 DIAGNOSIS — Z79891 Long term (current) use of opiate analgesic: Secondary | ICD-10-CM | POA: Diagnosis not present

## 2014-10-28 DIAGNOSIS — C50919 Malignant neoplasm of unspecified site of unspecified female breast: Secondary | ICD-10-CM | POA: Diagnosis present

## 2014-10-28 DIAGNOSIS — K219 Gastro-esophageal reflux disease without esophagitis: Secondary | ICD-10-CM | POA: Diagnosis present

## 2014-10-28 DIAGNOSIS — D509 Iron deficiency anemia, unspecified: Secondary | ICD-10-CM | POA: Diagnosis present

## 2014-10-28 DIAGNOSIS — E119 Type 2 diabetes mellitus without complications: Secondary | ICD-10-CM

## 2014-10-28 LAB — URINALYSIS, ROUTINE W REFLEX MICROSCOPIC
Bilirubin Urine: NEGATIVE
GLUCOSE, UA: NEGATIVE mg/dL
Hgb urine dipstick: NEGATIVE
KETONES UR: NEGATIVE mg/dL
LEUKOCYTES UA: NEGATIVE
NITRITE: NEGATIVE
PROTEIN: NEGATIVE mg/dL
Specific Gravity, Urine: 1.01 (ref 1.005–1.030)
UROBILINOGEN UA: 0.2 mg/dL (ref 0.0–1.0)
pH: 5 (ref 5.0–8.0)

## 2014-10-28 LAB — CBG MONITORING, ED: GLUCOSE-CAPILLARY: 137 mg/dL — AB (ref 65–99)

## 2014-10-28 LAB — GLUCOSE, CAPILLARY: Glucose-Capillary: 140 mg/dL — ABNORMAL HIGH (ref 65–99)

## 2014-10-28 LAB — CBC
HCT: 33.3 % — ABNORMAL LOW (ref 36.0–46.0)
HEMOGLOBIN: 10.7 g/dL — AB (ref 12.0–15.0)
MCH: 27.2 pg (ref 26.0–34.0)
MCHC: 32.1 g/dL (ref 30.0–36.0)
MCV: 84.5 fL (ref 78.0–100.0)
PLATELETS: 257 10*3/uL (ref 150–400)
RBC: 3.94 MIL/uL (ref 3.87–5.11)
RDW: 17.1 % — ABNORMAL HIGH (ref 11.5–15.5)
WBC: 13.4 10*3/uL — AB (ref 4.0–10.5)

## 2014-10-28 LAB — MRSA PCR SCREENING: MRSA BY PCR: NEGATIVE

## 2014-10-28 LAB — BASIC METABOLIC PANEL
ANION GAP: 9 (ref 5–15)
BUN: 23 mg/dL — ABNORMAL HIGH (ref 6–20)
CALCIUM: 8.7 mg/dL — AB (ref 8.9–10.3)
CO2: 22 mmol/L (ref 22–32)
CREATININE: 1.47 mg/dL — AB (ref 0.44–1.00)
Chloride: 110 mmol/L (ref 101–111)
GFR, EST AFRICAN AMERICAN: 44 mL/min — AB (ref >60)
GFR, EST NON AFRICAN AMERICAN: 38 mL/min — AB (ref >60)
Glucose, Bld: 143 mg/dL — ABNORMAL HIGH (ref 65–99)
Potassium: 4.6 mmol/L (ref 3.5–5.1)
SODIUM: 141 mmol/L (ref 135–145)

## 2014-10-28 LAB — PROCALCITONIN

## 2014-10-28 LAB — LACTIC ACID, PLASMA
LACTIC ACID, VENOUS: 1.9 mmol/L (ref 0.5–2.0)
LACTIC ACID, VENOUS: 1.4 mmol/L (ref 0.5–2.0)

## 2014-10-28 LAB — I-STAT CG4 LACTIC ACID, ED: LACTIC ACID, VENOUS: 1.18 mmol/L (ref 0.5–2.0)

## 2014-10-28 LAB — TSH: TSH: 1.549 u[IU]/mL (ref 0.350–4.500)

## 2014-10-28 LAB — CORTISOL: CORTISOL PLASMA: 5.6 ug/dL

## 2014-10-28 LAB — T4, FREE: FREE T4: 1.04 ng/dL (ref 0.61–1.12)

## 2014-10-28 MED ORDER — INSULIN ASPART 100 UNIT/ML ~~LOC~~ SOLN
0.0000 [IU] | Freq: Three times a day (TID) | SUBCUTANEOUS | Status: DC
  Administered 2014-10-28 – 2014-10-30 (×3): 2 [IU] via SUBCUTANEOUS

## 2014-10-28 MED ORDER — VALACYCLOVIR HCL 500 MG PO TABS: 1000.0000 mg | ORAL_TABLET | Freq: Two times a day (BID) | ORAL | Status: DC

## 2014-10-28 MED ORDER — GABAPENTIN 100 MG PO CAPS: 100.0000 mg | ORAL_CAPSULE | Freq: Three times a day (TID) | ORAL | Status: DC

## 2014-10-28 MED ORDER — ONDANSETRON HCL 4 MG PO TABS
4.0000 mg | ORAL_TABLET | Freq: Four times a day (QID) | ORAL | Status: DC | PRN
  Administered 2014-10-29 – 2014-10-30 (×2): 4 mg via ORAL
  Filled 2014-10-28 (×2): qty 1

## 2014-10-28 MED ORDER — LEVOTHYROXINE SODIUM 75 MCG PO TABS
150.0000 ug | ORAL_TABLET | Freq: Every day | ORAL | Status: DC
  Administered 2014-10-29 – 2014-10-30 (×2): 150 ug via ORAL
  Filled 2014-10-28 (×3): qty 1
  Filled 2014-10-28: qty 2

## 2014-10-28 MED ORDER — CALCIUM 600-200 MG-UNIT PO TABS: 1.0000 | ORAL_TABLET | Freq: Every day | ORAL | Status: DC

## 2014-10-28 MED ORDER — SODIUM CHLORIDE 0.9 % IV BOLUS (SEPSIS)
1000.0000 mL | INTRAVENOUS | Status: AC
  Administered 2014-10-28: 1000 mL via INTRAVENOUS

## 2014-10-28 MED ORDER — TAPENTADOL HCL 50 MG PO TABS
75.0000 mg | ORAL_TABLET | Freq: Four times a day (QID) | ORAL | Status: DC
  Administered 2014-10-28 – 2014-10-30 (×9): 75 mg via ORAL
  Filled 2014-10-28 (×9): qty 2

## 2014-10-28 MED ORDER — ONDANSETRON HCL 4 MG/2ML IJ SOLN: 4.0000 mg | Freq: Four times a day (QID) | INTRAMUSCULAR | Status: DC | PRN

## 2014-10-28 MED ORDER — PIPERACILLIN-TAZOBACTAM 3.375 G IVPB 30 MIN
3.3750 g | Freq: Three times a day (TID) | INTRAVENOUS | Status: DC
  Administered 2014-10-28: 3.375 g via INTRAVENOUS

## 2014-10-28 MED ORDER — SODIUM CHLORIDE 0.9 % IV BOLUS (SEPSIS)
1000.0000 mL | Freq: Once | INTRAVENOUS | Status: AC
  Administered 2014-10-28: 1000 mL via INTRAVENOUS

## 2014-10-28 MED ORDER — ROSUVASTATIN CALCIUM 10 MG PO TABS
20.0000 mg | ORAL_TABLET | Freq: Every day | ORAL | Status: DC
  Administered 2014-10-29 – 2014-10-30 (×2): 20 mg via ORAL
  Filled 2014-10-28: qty 2
  Filled 2014-10-28: qty 1

## 2014-10-28 MED ORDER — PREGABALIN 50 MG PO CAPS
150.0000 mg | ORAL_CAPSULE | Freq: Every evening | ORAL | Status: DC
  Administered 2014-10-28 – 2014-10-30 (×3): 150 mg via ORAL
  Filled 2014-10-28: qty 3
  Filled 2014-10-28 (×2): qty 1
  Filled 2014-10-28: qty 3

## 2014-10-28 MED ORDER — ENOXAPARIN SODIUM 40 MG/0.4ML ~~LOC~~ SOLN
40.0000 mg | SUBCUTANEOUS | Status: DC
  Administered 2014-10-28 – 2014-10-30 (×3): 40 mg via SUBCUTANEOUS
  Filled 2014-10-28 (×4): qty 0.4

## 2014-10-28 MED ORDER — ACETAMINOPHEN 650 MG RE SUPP: 650.0000 mg | Freq: Four times a day (QID) | RECTAL | Status: DC | PRN

## 2014-10-28 MED ORDER — ASPIRIN EC 81 MG PO TBEC
81.0000 mg | DELAYED_RELEASE_TABLET | Freq: Every day | ORAL | Status: DC
  Administered 2014-10-28 – 2014-10-29 (×2): 81 mg via ORAL
  Filled 2014-10-28 (×2): qty 1

## 2014-10-28 MED ORDER — GABAPENTIN 100 MG PO CAPS
100.0000 mg | ORAL_CAPSULE | Freq: Two times a day (BID) | ORAL | Status: DC
  Administered 2014-10-28 – 2014-10-30 (×5): 100 mg via ORAL
  Filled 2014-10-28 (×7): qty 1

## 2014-10-28 MED ORDER — GABAPENTIN 300 MG PO CAPS
300.0000 mg | ORAL_CAPSULE | Freq: Every day | ORAL | Status: DC
  Administered 2014-10-28 – 2014-10-29 (×2): 300 mg via ORAL
  Filled 2014-10-28 (×2): qty 1

## 2014-10-28 MED ORDER — DICLOFENAC SODIUM 1 % TD GEL
2.0000 g | Freq: Two times a day (BID) | TRANSDERMAL | Status: DC | PRN
  Filled 2014-10-28: qty 100

## 2014-10-28 MED ORDER — ENOXAPARIN SODIUM 30 MG/0.3ML ~~LOC~~ SOLN: 30.0000 mg | SUBCUTANEOUS | Status: DC

## 2014-10-28 MED ORDER — PANTOPRAZOLE SODIUM 40 MG PO TBEC
40.0000 mg | DELAYED_RELEASE_TABLET | Freq: Every day | ORAL | Status: DC
  Administered 2014-10-28 – 2014-10-30 (×3): 40 mg via ORAL
  Filled 2014-10-28 (×3): qty 1

## 2014-10-28 MED ORDER — SODIUM CHLORIDE 0.9 % IV SOLN
INTRAVENOUS | Status: DC
  Administered 2014-10-28: 07:00:00 via INTRAVENOUS

## 2014-10-28 MED ORDER — PAROXETINE HCL 20 MG PO TABS
40.0000 mg | ORAL_TABLET | Freq: Every day | ORAL | Status: DC
  Administered 2014-10-29 – 2014-10-30 (×2): 40 mg via ORAL
  Filled 2014-10-28 (×2): qty 2

## 2014-10-28 MED ORDER — VITAMIN D 1000 UNITS PO TABS
1000.0000 [IU] | ORAL_TABLET | Freq: Every day | ORAL | Status: DC
  Administered 2014-10-28 – 2014-10-30 (×3): 1000 [IU] via ORAL
  Filled 2014-10-28 (×3): qty 1

## 2014-10-28 MED ORDER — CALCIUM CARBONATE-VITAMIN D 500-200 MG-UNIT PO TABS
1.0000 | ORAL_TABLET | Freq: Every day | ORAL | Status: DC
  Administered 2014-10-29 – 2014-10-30 (×2): 1 via ORAL
  Filled 2014-10-28 (×4): qty 1

## 2014-10-28 MED ORDER — VANCOMYCIN HCL IN DEXTROSE 1-5 GM/200ML-% IV SOLN: 1000.0000 mg | INTRAVENOUS | Status: DC

## 2014-10-28 MED ORDER — ACETAMINOPHEN 325 MG PO TABS
650.0000 mg | ORAL_TABLET | Freq: Four times a day (QID) | ORAL | Status: DC | PRN
  Administered 2014-10-28 – 2014-10-29 (×2): 650 mg via ORAL
  Filled 2014-10-28 (×2): qty 2

## 2014-10-28 MED ORDER — OXYCODONE HCL 5 MG PO TABS
5.0000 mg | ORAL_TABLET | Freq: Four times a day (QID) | ORAL | Status: DC | PRN
  Administered 2014-10-28: 10 mg via ORAL
  Administered 2014-10-28: 5 mg via ORAL
  Administered 2014-10-29: 10 mg via ORAL
  Administered 2014-10-29 – 2014-10-30 (×2): 5 mg via ORAL
  Administered 2014-10-30: 10 mg via ORAL
  Filled 2014-10-28: qty 2
  Filled 2014-10-28: qty 1
  Filled 2014-10-28: qty 2
  Filled 2014-10-28: qty 1
  Filled 2014-10-28: qty 2
  Filled 2014-10-28: qty 1

## 2014-10-28 MED ORDER — TAPENTADOL HCL 50 MG PO TABS
100.0000 mg | ORAL_TABLET | Freq: Four times a day (QID) | ORAL | Status: DC
  Administered 2014-10-28: 100 mg via ORAL
  Filled 2014-10-28: qty 2

## 2014-10-28 MED ORDER — ALUM & MAG HYDROXIDE-SIMETH 200-200-20 MG/5ML PO SUSP
30.0000 mL | Freq: Four times a day (QID) | ORAL | Status: DC | PRN
  Administered 2014-10-29: 30 mL via ORAL
  Filled 2014-10-28: qty 30

## 2014-10-28 NOTE — ED Notes (Signed)
Spoke with Dr. Arnoldo Morale regarding patients request for oxycodone.  Not ordering at this time.  Also clarified Fluid bolus orders.  Per Dr. Arnoldo Morale to stop bolus' after patient received 4th bolus then change to maintenance fluid.  Final bolus infusing at this time.

## 2014-10-28 NOTE — Consult Note (Signed)
Name: [name] MRN: [id number] DOB: [birth year]    ADMISSION DATE:  10/27/2014 CONSULTATION DATE:  10/28/2014  REFERRING MD :  Dr. Billy Fischer  CHIEF COMPLAINT:  Hypotension  HISTORY OF PRESENT ILLNESS:   28F with mmp including HTN, DM with polyneuropathy and hypothyroid.  She presents to the ED today for syncope x2 episodes, the first was 2 weeks ago when she "blacked out" and was found by her daughter, at that time her daughter told her she was quite disoriented and confused after the episode.  Her daughter did not call 911 at that time but the patient did see her PCP.  Over the last few days she reports some GI symptoms including n/v/d and decreased PO intake.  She states she continued to take her BP meds at that time and today she lost consciousness again.  When she woke up she took her BP which was low and she called 911.    She is a diabetic and takes insulin, though she states over the last few weeks she has not been experiencing any low blood sugars.  Additionally, she has not lost bowel or bladder function with these episodes and has not had any witnessed seizure activity.  She denies any strenuous activity at the time of these episodes.  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  has past surgical history that includes Total knee arthroplasty (Bilateral, 2009-06/2009); Foot fracture surgery (Right, 1990's); Shoulder open rotator cuff repair (Left, 1990's); Thyroidectomy (1970's); Joint replacement; Abdominal hysterectomy (1979?); Cholecystectomy (1980's?); Colonoscopy with propofol (N/A, 09/28/2012); Revision total knee arthroplasty (07/2009); Breast surgery (Right, 01/2012); and Breast lumpectomy (03/03/2012). Prior to Admission medications   Medication Sig Start Date End Date Taking? Authorizing Provider  aspirin EC 81 MG tablet Take 1 tablet (81 mg total) by mouth daily. Patient taking differently: Take 81 mg by mouth at bedtime.  08/12/13  Yes Adeline Saralyn Pilar, MD  CALCIUM PO Take 1 tablet by mouth daily.    Yes Historical Provider, MD  cholecalciferol (VITAMIN D) 1000 UNITS tablet Take 1 tablet (1,000 Units total) by mouth daily. 11/18/12  Yes Chauncey Cruel, MD  diclofenac sodium (VOLTAREN) 1 % GEL Apply 2 g topically 2 (two) times daily as needed (pain).    Yes Historical Provider, MD  esomeprazole (NEXIUM) 40 MG capsule Take 40 mg by mouth daily before breakfast.   Yes Historical Provider, MD  ferrous sulfate 325 (65 FE) MG EC tablet Take 325 mg by mouth daily with breakfast.   Yes Historical Provider, MD  gabapentin (NEURONTIN) 100 MG capsule Take 100-300 mg by mouth 3 (three) times daily. Takes 120m in am, 1012min pm, and 30053mt bedtime   Yes Historical Provider, MD  insulin aspart (NOVOLOG) 100 UNIT/ML injection Inject 0-6 Units into the skin 3 (three) times daily as needed for high blood sugar (200-299 = 4 units, 300 and above = 6 units). Sliding scale   Yes Historical Provider, MD  Iron-Vitamins (  GERITOL COMPLETE) TABS Take 1 tablet by mouth daily.   Yes Historical Provider, MD  levothyroxine (SYNTHROID, LEVOTHROID) 150 MCG tablet Take 150 mcg by mouth daily before breakfast.   Yes Historical Provider, MD  lisinopril (PRINIVIL,ZESTRIL) 2.5  MG tablet Take 2.5 mg by mouth daily.   Yes Historical Provider, MD  LORazepam (ATIVAN) 2 MG tablet Take 1 tablet (2 mg total) by mouth daily as needed for anxiety. Patient taking differently: Take 2 mg by mouth daily.  08/16/14  Yes Kinnie Feil, MD  meloxicam (MOBIC) 15 MG tablet Take 15 mg by mouth daily.    Yes Historical Provider, MD  NUCYNTA ER 200 MG TB12 Take 200 mg by mouth every 12 (twelve) hours. 09/18/14  Yes Historical Provider, MD  oxyCODONE (ROXICODONE) 15 MG immediate release tablet Take 1 tablet (15 mg total) by mouth every 8 (eight) hours as needed for pain. Patient taking differently: Take 15 mg by mouth 6 (six) times daily.  08/16/14  Yes Kinnie Feil, MD  PARoxetine (PAXIL) 40 MG tablet Take 40 mg by mouth daily. 07/28/14  Yes Historical Provider, MD  pregabalin (LYRICA) 75 MG capsule Take 2 capsules (150 mg total) by mouth at bedtime. 2 tablets at bedtime Patient taking differently: Take 150 mg by mouth every evening. 2 tablets at bedtime 08/16/14  Yes Kinnie Feil, MD  rosuvastatin (CRESTOR) 20 MG tablet Take 20 mg by mouth daily.   Yes Historical Provider, MD  sitaGLIPtin-metformin (JANUMET) 50-500 MG per tablet Take 1 tablet by mouth 2 (two) times daily with a meal.   Yes Historical Provider, MD  valACYclovir (VALTREX) 1000 MG tablet Take 1,000 mg by mouth 2 (two) times daily.    Yes Historical Provider, MD  valsartan-hydrochlorothiazide (DIOVAN-HCT) 160-12.5 MG per tablet Take 1 tablet by mouth daily.   Yes Historical Provider, MD  amoxicillin (AMOXIL) 500 MG capsule Take 1 capsule (500 mg total) by mouth 3 (three) times daily. 09/12/14   Domenic Moras, PA-C  lidocaine (XYLOCAINE) 2 % solution Use as directed 10 mLs in the mouth or throat as needed for mouth pain. 09/12/14   Domenic Moras, PA-C   Allergies  Allergen Reactions  . Shellfish Allergy Shortness Of Breath  . Bee Venom Hives  . Morphine And Related Other (See Comments)    Overly sedated    FAMILY HISTORY:    family history includes Bone cancer in her mother; Breast cancer in her maternal grandmother and paternal grandmother; Breast cancer (age of onset: 3) in her paternal aunt; Breast cancer (age of onset: 59) in her sister; Breast cancer (age of onset: 22) in her mother; Diabetes in her brother, mother, and sister; Heart disease in her brother and maternal grandmother; Hypertension in her mother and sister; Prostate cancer in her paternal grandfather; Uterine cancer (age of onset: 73) in her other. SOCIAL HISTORY:  reports that she quit smoking about 22 years ago. Her smoking use included Cigarettes. She has a 7 pack-year smoking history. She has never used smokeless tobacco. She reports that she does not drink alcohol or use illicit drugs.  REVIEW OF SYSTEMS:   Constitutional: Negative for fever, chills, weight loss, malaise/fatigue and diaphoresis.  HENT: Negative for hearing loss, ear pain, nosebleeds, congestion, sore throat, neck pain, tinnitus and ear discharge.   Eyes: Negative for blurred vision, double vision, photophobia, pain, discharge and redness.  Respiratory: Negative for cough, hemoptysis, sputum production, shortness of breath, wheezing and stridor.   Cardiovascular: Negative for chest pain,  palpitations, orthopnea, claudication, leg swelling and PND.  Gastrointestinal: Negative for heartburn, nausea, vomiting, abdominal pain, diarrhea, constipation, blood in stool and melena.  Genitourinary: Negative for dysuria, urgency, frequency, hematuria and flank pain.  Musculoskeletal: Negative for myalgias, back pain, joint pain and falls.  Skin: Negative for itching and rash.  Neurological: Negative for dizziness, tingling, tremors, sensory change, speech change, focal weakness, seizures, loss of consciousness, weakness and headaches.  Endo/Heme/Allergies: Negative for environmental allergies and polydipsia. Does not bruise/bleed easily.  SUBJECTIVE:   VITAL SIGNS: Temp:  [98 F (36.7  C)-98.2 F (36.8 C)] 98 F (36.7 C) (09/09 2207) Pulse Rate:  [86-106] 86 (09/10 0115) Resp:  [9-21] 13 (09/10 0115) BP: (75-95)/(33-63) 95/55 mmHg (09/10 0115) SpO2:  [89 %-100 %] 99 % (09/10 0115) Weight:  [127.007 kg (280 lb)] 127.007 kg (280 lb) (09/09 2207)  PHYSICAL EXAMINATION: General:  NAD, AAOx3 Neuro:  CN II-XII intact no focal deficits HEENT:  NCAT, PERLA, EOMI Cardiovascular:  RRR, no m/r/g Lungs:  Limited due to body habitus and patient position.  No overt w/r/r Abdomen:  Large, soft, no guarding, normal bowel sounds Musculoskeletal:  Moves all extremities evenly Skin:  No obvious rashes   Recent Labs Lab 10/27/14 2223  NA 137  K 3.5  CL 110  CO2 20*  BUN 24*  CREATININE 1.31*  GLUCOSE 99    Recent Labs Lab 10/27/14 2223  HGB 9.7*  HCT 29.3*  WBC 13.3*  PLT 255   Dg Chest Port 1 View  10/27/2014   CLINICAL DATA:  Shortness of breath and chest pain.  EXAM: PORTABLE CHEST - 1 VIEW  COMPARISON:  08/14/2014  FINDINGS: The heart size and mediastinal contours are within normal limits. Both lungs are clear. The visualized skeletal structures are unremarkable.  IMPRESSION: No active disease.   Electronically Signed   By: Kerby Moors M.D.   On: 10/27/2014 23:17    ASSESSMENT / PLAN:  71F with syncope likely due to hypotension from poor PO intake, volume loss from diarrhea in the setting of continued anti-hypertensive use.  At present time she is warm, mentation normal and well perfused, her hypotension likely represents hypovolemia.There is no evidence of shock requiring vasopressor supportive measures.  - Continue gentle fluid rehydration  - hold home BP meds  - check orthostatics  - evaluation of TSH  - evaluation for seizure disorder  - recommend holter monitor on discharge.  - Recommend down titrating insulin regimen  - recommend down titrating BP regimen  Total critical care time: 30 min  Critical care time was exclusive of separately billable  procedures and treating other patients.  Critical care was necessary to treat or prevent imminent or life-threatening deterioration.  Critical care was time spent personally by me on the following activities: development of treatment plan with patient and/or surrogate as well as nursing, discussions with consultants, evaluation of patient's response to treatment, examination of patient, obtaining history from patient or surrogate, ordering and performing treatments and interventions, ordering and review of laboratory studies, ordering and review of radiographic studies, pulse oximetry and re-evaluation of patient's condition.   Meribeth Mattes, DO., MS [facility]   Pulmonary and [place] Pager: [phone]  10/28/2014, 1:30 AM

## 2014-10-28 NOTE — ED Provider Notes (Addendum)
CSN: [id number]     Arrival date & time 10/27/14  2145 History   First MD Initiated Contact with Patient 10/27/14 2158     Chief Complaint  Patient presents with  . Weakness     (Consider location/radiation/quality/duration/timing/severity/associated sxs/prior Treatment) HPI Comments: Patient with admission for hypotension in June 2016 at which time her blood pressure medications were discontinued with the thought of these contributed to the episode. Patient reports her blood pressures continued to increase as an outpatient and she is restarted on these medications which she took this morning. Patient also reports she took her oxycodone and Lyrica today. Denies any infectious symptoms including no dysuria cough, no sore throat, no rash, no abdominal pain. Denies any black or bloody stools. Denies any other changes in medication recently.  Patient is a 59 y.o. female presenting with syncope and near-syncope.  Loss of Consciousness Episode history:  Single Most recent episode:  Today Timing:  Constant Progression:  Unchanged Chronicity:  New Context: dehydration (decreased appetite, a few episodes of diarrhea days ago, one episode emesis today)   Associated symptoms: nausea, shortness of breath (mild) and vomiting (one episode)   Associated symptoms: no chest pain, no diaphoresis, no fever, no headaches, no recent injury, no recent surgery and no weakness   Near Syncope This is a new problem. Progression since onset: waxing and waning over last 2 weeks with worsening today. Associated symptoms include shortness of breath (mild). Pertinent negatives include no chest pain, no abdominal pain and no headaches.    Past Medical History  Diagnosis Date  . Depression   . Thyroid disease   . GERD (gastroesophageal reflux disease)   . Pancreatitis     hx of  . Knee pain, bilateral   . Obesity   . HSV (herpes simplex virus) infection   . Hyperlipemia   . Asthma   . Tachycardia     with sx in  2008  . Breast mass in female     L breast 2008, US showed likely fat necrosis  . Anxiety   . Hypertension     sees Dr. Criss Rosales [facility] [place]  . Hx of radiation therapy 05/05/12- 07/15/12    right breast, 45 gray x 25 fx, lumpectomy cavity boosted to 16.2 gray  . History of blood transfusion 2011    "after one of my OR's"  . Hypothyroidism   . 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 Polyneuropathy in diabetes(357.2)   . Benign paroxysmal positional vertigo 12/30/2012  . 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  . OSA on CPAP     pt does not know settings  . Pneumonia 1950's  . Anemia   . Type II diabetes mellitus   . History of hiatal hernia   . History of stomach ulcers   . Daily headache   . Migraines     "~ 3 times/month" (01/06/2014)  . Arthritis     "pretty much all my joints"  . Chronic lower back pain   . Schizophrenia   . Kidney stones   . Chronic kidney disease (CKD)     "lower stage" (01/06/2014)   Past Surgical History  Procedure Laterality Date  . Total knee arthroplasty Bilateral 2009-06/2009    left; right  . Foot fracture surgery Right 1990's  . Shoulder open rotator cuff repair Left 1990's  . Thyroidectomy  1970's  . Joint replacement    . Abdominal hysterectomy  1979?    partial  . Cholecystectomy  1980's?  . Colonoscopy with propofol N/A 09/28/2012    Procedure: COLONOSCOPY WITH PROPOFOL;  Surgeon: Lear Ng, MD;  Location: [facility];  Service: Endoscopy;  Laterality: N/A;  . Revision total knee arthroplasty  07/2009  . Breast surgery Right 01/2012    "cancer"  . Breast lumpectomy  03/03/2012    Procedure: LUMPECTOMY;  Surgeon: Haywood Lasso, MD;  Location: [facility];  Service: General;  Laterality: Right;   Family History  Problem Relation Age of Onset  . Heart disease Brother     Multiple MIs, starting in his 63s  . Diabetes Brother   . Hypertension Mother   . Diabetes Mother   .  Breast cancer Mother 20  . Bone cancer Mother   . Hypertension Sister   . Diabetes Sister   . Breast cancer Sister 69  . Breast cancer Maternal Grandmother   . Heart disease Maternal Grandmother   . Uterine cancer Other 19  . Breast cancer Paternal Aunt 44  . Breast cancer Paternal Grandmother     dx in her 25s  . Prostate cancer Paternal Grandfather    Social History  Substance Use Topics  . Smoking status: Former Smoker -- 1.00 packs/day for 7 years    Types: Cigarettes    Quit date: 03/24/1992  . Smokeless tobacco: Never Used  . Alcohol Use: No     Comment: "stopped drinking in 1996; I was an alcoholic"   OB History    No data available     Review of Systems  Constitutional: Negative for fever and diaphoresis.  HENT: Negative for sore throat.   Eyes: Negative for visual disturbance.  Respiratory: Positive for shortness of breath (mild). Negative for cough.   Cardiovascular: Positive for syncope and near-syncope. Negative for chest pain.  Gastrointestinal: Positive for nausea, vomiting (one episode) and diarrhea. Negative for abdominal pain, constipation and blood in stool.  Genitourinary: Negative for difficulty urinating.  Musculoskeletal: Negative for back pain and neck pain.  Skin: Negative for rash.  Neurological: Positive for syncope and light-headedness. Negative for weakness and headaches.      Allergies  Shellfish allergy; Bee venom; and Morphine and related  Home Medications   Prior to Admission medications   Medication Sig Start Date End Date Taking? Authorizing Provider  aspirin EC 81 MG tablet Take 1 tablet (81 mg total) by mouth daily. Patient taking differently: Take 81 mg by mouth at bedtime.  08/12/13  Yes Adeline Saralyn Pilar, MD  CALCIUM PO Take 1 tablet by mouth daily.    Yes Historical Provider, MD  cholecalciferol (VITAMIN D) 1000 UNITS tablet Take 1 tablet (1,000 Units total) by mouth daily. 11/18/12  Yes Chauncey Cruel, MD  diclofenac sodium  (VOLTAREN) 1 % GEL Apply 2 g topically 2 (two) times daily as needed (pain).    Yes Historical Provider, MD  esomeprazole (NEXIUM) 40 MG capsule Take 40 mg by mouth daily before breakfast.   Yes Historical Provider, MD  ferrous sulfate 325 (65 FE) MG EC tablet Take 325 mg by mouth daily with breakfast.   Yes Historical Provider, MD  gabapentin (NEURONTIN) 100 MG capsule Take 100-300 mg by mouth 3 (three) times daily. Takes 158m in am, 1082min pm, and 30030mt bedtime   Yes Historical Provider, MD  insulin aspart (NOVOLOG) 100 UNIT/ML injection Inject 0-6 Units into the skin 3 (three) times daily as needed for high blood sugar (200-299 =  4 units, 300 and above = 6 units). Sliding scale   Yes Historical Provider, MD  Iron-Vitamins (GERITOL COMPLETE) TABS Take 1 tablet by mouth daily.   Yes Historical Provider, MD  levothyroxine (SYNTHROID, LEVOTHROID) 150 MCG tablet Take 150 mcg by mouth daily before breakfast.   Yes Historical Provider, MD  lisinopril (PRINIVIL,ZESTRIL) 2.5 MG tablet Take 2.5 mg by mouth daily.   Yes Historical Provider, MD  LORazepam (ATIVAN) 2 MG tablet Take 1 tablet (2 mg total) by mouth daily as needed for anxiety. Patient taking differently: Take 2 mg by mouth daily.  08/16/14  Yes Kinnie Feil, MD  meloxicam (MOBIC) 15 MG tablet Take 15 mg by mouth daily.    Yes Historical Provider, MD  NUCYNTA ER 200 MG TB12 Take 200 mg by mouth every 12 (twelve) hours. 09/18/14  Yes Historical Provider, MD  oxyCODONE (ROXICODONE) 15 MG immediate release tablet Take 1 tablet (15 mg total) by mouth every 8 (eight) hours as needed for pain. Patient taking differently: Take 15 mg by mouth 6 (six) times daily.  08/16/14  Yes Kinnie Feil, MD  PARoxetine (PAXIL) 40 MG tablet Take 40 mg by mouth daily. 07/28/14  Yes Historical Provider, MD  pregabalin (LYRICA) 75 MG capsule Take 2 capsules (150 mg total) by mouth at bedtime. 2 tablets at bedtime Patient taking differently: Take 150 mg by mouth  every evening. 2 tablets at bedtime 08/16/14  Yes Kinnie Feil, MD  rosuvastatin (CRESTOR) 20 MG tablet Take 20 mg by mouth daily.   Yes Historical Provider, MD  sitaGLIPtin-metformin (JANUMET) 50-500 MG per tablet Take 1 tablet by mouth 2 (two) times daily with a meal.   Yes Historical Provider, MD  valACYclovir (VALTREX) 1000 MG tablet Take 1,000 mg by mouth 2 (two) times daily.    Yes Historical Provider, MD  valsartan-hydrochlorothiazide (DIOVAN-HCT) 160-12.5 MG per tablet Take 1 tablet by mouth daily.   Yes Historical Provider, MD  amoxicillin (AMOXIL) 500 MG capsule Take 1 capsule (500 mg total) by mouth 3 (three) times daily. 09/12/14   Domenic Moras, PA-C  lidocaine (XYLOCAINE) 2 % solution Use as directed 10 mLs in the mouth or throat as needed for mouth pain. 09/12/14   Domenic Moras, PA-C   BP 95/55 mmHg  Pulse 86  Temp(Src) 98 F (36.7 C) (Oral)  Resp 13  Ht _0  (1.651 m)  Wt 280 lb (127.007 kg)  BMI 46.59 kg/m2  SpO2 99% Physical Exam  Constitutional: She is oriented to person, place, and time. She appears well-developed and well-nourished. No distress.  HENT:  Head: Normocephalic and atraumatic.  Eyes: Conjunctivae and EOM are normal.  Neck: Normal range of motion.  Cardiovascular: Normal rate, regular rhythm, normal heart sounds and intact distal pulses.  Exam reveals no gallop and no friction rub.   No murmur heard. Pulmonary/Chest: Effort normal and breath sounds normal. No respiratory distress. She has no wheezes. She has no rales.  Abdominal: Soft. She exhibits no distension. There is no tenderness. There is no guarding.  Musculoskeletal: She exhibits no edema or tenderness.  Neurological: She is alert and oriented to person, place, and time. She has normal strength. No cranial nerve deficit. GCS eye subscore is 4. GCS verbal subscore is 5. GCS motor subscore is 6.  Skin: Skin is warm and dry. No rash noted. She is not diaphoretic. No erythema.  Nursing note and vitals  reviewed.   ED Course  Procedures (including critical care time) Labs  Review Labs Reviewed  COMPREHENSIVE METABOLIC PANEL - Abnormal; Notable for the following:    CO2 20 (*)    BUN 24 (*)    Creatinine, Ser 1.31 (*)    Calcium 7.8 (*)    Total Protein 5.1 (*)    Albumin 2.5 (*)    AST 14 (*)    Total Bilirubin 0.2 (*)    GFR calc non Af Amer 44 (*)    GFR calc Af Amer 51 (*)    All other components within normal limits  CBC WITH DIFFERENTIAL/PLATELET - Abnormal; Notable for the following:    WBC 13.3 (*)    RBC 3.52 (*)    Hemoglobin 9.7 (*)    HCT 29.3 (*)    RDW 17.1 (*)    Neutro Abs 9.4 (*)    All other components within normal limits  CULTURE, BLOOD (ROUTINE X 2)  CULTURE, BLOOD (ROUTINE X 2)  URINE CULTURE  URINALYSIS, ROUTINE W REFLEX MICROSCOPIC (NOT AT [facility])  PROTIME-INR  TSH  T4, FREE  I-STAT CG4 LACTIC ACID, ED  I-STAT TROPOININ, ED  I-STAT CG4 LACTIC ACID, ED    Imaging Review Dg Chest Port 1 View  10/27/2014   CLINICAL DATA:  Shortness of breath and chest pain.  EXAM: PORTABLE CHEST - 1 VIEW  COMPARISON:  08/14/2014  FINDINGS: The heart size and mediastinal contours are within normal limits. Both lungs are clear. The visualized skeletal structures are unremarkable.  IMPRESSION: No active disease.   Electronically Signed   By: Kerby Moors M.D.   On: 10/27/2014 23:17   I have personally reviewed and evaluated these images and lab results as part of my medical decision-making.   EKG Interpretation   Date/Time:  Friday October 27 2014 21:53:49 EDT Ventricular Rate:  104 PR Interval:  148 QRS Duration: 70 QT Interval:  323 QTC Calculation: 425 R Axis:   -30 Text Interpretation:  Sinus tachycardia Left axis deviation Abnormal  R-wave progression, early transition No significant change since last  tracing Confirmed by [facility] MD, [place] ([id number]) on 10/28/2014 1:12:24 AM      MDM   Final diagnoses:  None   59 year old female with a history of  hypothyroidism, hypertension, obesity, depression, chronic back pain, admission at the end of June for concern of hypotension, presents with concern of syncope with hypotension. DDx for syncope/hypotension includes cardiac arrhythmia, MI, PE, electrolyte abnormality, hypovolemia including dehydration and anemia/GI bleed, infection, medication affects.  Patient denies history to suggest infection, however given level of hypotension with tachycardia a code sepsis was initiated and empiric vancomycin and Zosyn were given and blood cx obtained. Chest x-ray revealed no sign of pneumonia, urinalysis showed no signs of UTI. Patient denied any other infectious symptoms. Hemoglobin at patient's baseline, and she denies any black or bloody stools.  DDx continues to include infxn vs polypharmacy, with pt having hx of low blood pressures in the past.   Patient with normal mentation, normal lactic acid, renal function unchanged from prior, however blood pressures continued to be low despite 2 L of normal saline and critical care was consulted for possible admission.   As patient was evaluated by critical care she is receiving third and fourth liter of normal saline and blood pressures began to improve.  Given patient continuing to have normal mentation, feel she is stable for [facility].   CRITICAL CARE: HYPOTENSION, UNDIFFERENTIATED SHOCK Performed by: Alvino Chapel   Total critical care time: 66mn  Critical care time  was exclusive of separately billable procedures and treating other patients.  Critical care was necessary to treat or prevent imminent or life-threatening deterioration.  Critical care was time spent personally by me on the following activities: development of treatment plan with patient and/or surrogate as well as nursing, discussions with consultants, evaluation of patient's response to treatment, examination of patient, obtaining history from patient or surrogate, ordering and performing  treatments and interventions, ordering and review of laboratory studies, ordering and review of radiographic studies, pulse oximetry and re-evaluation of patient's condition.   Gareth Morgan, MD 10/28/14 [phone]

## 2014-10-28 NOTE — ED Notes (Signed)
Patient was given a sprite,and a breakfast tray was ordered for patient.

## 2014-10-28 NOTE — ED Notes (Signed)
Attempted report.

## 2014-10-28 NOTE — H&P (Signed)
[facility] Admission History and Physical       MONTEEN TOOPS ZCH:[id number] DOB: 03/09/1955 DOA: 10/27/2014  Referring physician: EDP PCP: [name], MD  Specialists:   Chief Complaint: Passed Out  HPI: [name] is a 59 y.o. female with a history of HTN, DM2, Hypothyroid, CKD, OSA and Previous Breast Ca S/P Lumpectomy and Radiation Rx who complains of Light-Headedness and Dizziness for the past 2 weeks.   She reports having a syncopal episode this evening, and had a previous episode 2 weeks ago.   She reports having a dull headache on the right side of her head x 2 weeks as well.   She had N+V x 1 this AM.  She denies fevers and Chills.    In the ED, she was found to have hypotension, which only temporarily improved with Fluids.   A Sepsis Workup was initiated and she was placed on empiric IV Vancomycin and Zosyn.      Review of Systems: Constitutional: No Weight Loss, No Weight Gain, Night Sweats, Fevers, Chills, +Dizziness, +Light Headedness, Fatigue, or Generalized Weakness HEENT: No Headaches, Difficulty Swallowing,Tooth/Dental Problems,Sore Throat,  No Sneezing, Rhinitis, Ear Ache, Nasal Congestion, or Post Nasal Drip,  Cardio-vascular:  No Chest pain, Orthopnea, PND, Edema in Lower Extremities, Anasarca, Dizziness, Palpitations  Resp: No Dyspnea, No DOE, No Productive Cough, No Non-Productive Cough, No Hemoptysis, No Wheezing.    GI: No Heartburn, Indigestion, Abdominal Pain, Nausea, Vomiting, Diarrhea, Constipation, Hematemesis, Hematochezia, Melena, Change in Bowel Habits,  Loss of Appetite  GU: No Dysuria, No Change in Color of Urine, No Urgency or Urinary Frequency, No Flank pain.  Musculoskeletal: No Joint Pain or Swelling, No Decreased Range of Motion, No Back Pain.  Neurologic:  +Syncope, No Seizures, Muscle Weakness, Paresthesia, Vision Disturbance or Loss, No Diplopia, No Vertigo, No Difficulty Walking,  Skin: No Rash or Lesions. Psych: No Change in  Mood or Affect, No Depression or Anxiety, No Memory loss, No Confusion, or Hallucinations   Past Medical History  Diagnosis Date  . Depression   . Thyroid disease   . GERD (gastroesophageal reflux disease)   . Pancreatitis     hx of  . Knee pain, bilateral   . Obesity   . HSV (herpes simplex virus) infection   . Hyperlipemia   . Asthma   . Tachycardia     with sx in 2008  . Breast mass in female     L breast 2008, US showed likely fat necrosis  . Anxiety   . Hypertension     sees Dr. Criss Rosales , [facility]  . Hx of radiation therapy 05/05/12- 07/15/12    right breast, 45 gray x 25 fx, lumpectomy cavity boosted to 16.2 gray  . History of blood transfusion 2011    "after one of my OR's"  . Hypothyroidism   . 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 Polyneuropathy in diabetes(357.2)   . Benign paroxysmal positional vertigo 12/30/2012  . 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  . OSA on CPAP     pt does not know settings  . Pneumonia 1950's  . Anemia   . Type II diabetes mellitus   . History of hiatal hernia   . History of stomach ulcers   . Daily headache   . Migraines     "~ 3 times/month" (01/06/2014)  . Arthritis     "pretty much all my joints"  . Chronic  lower back pain   . Schizophrenia   . Kidney stones   . Chronic kidney disease (CKD)     "lower stage" (01/06/2014)     Past Surgical History  Procedure Laterality Date  . Total knee arthroplasty Bilateral 2009-06/2009    left; right  . Foot fracture surgery Right 1990's  . Shoulder open rotator cuff repair Left 1990's  . Thyroidectomy  1970's  . Joint replacement    . Abdominal hysterectomy  1979?    partial  . Cholecystectomy  1980's?  . Colonoscopy with propofol N/A 09/28/2012    Procedure: COLONOSCOPY WITH PROPOFOL;  Surgeon: Lear Ng, MD;  Location: [facility];  Service: Endoscopy;  Laterality: N/A;  . Revision total knee arthroplasty  07/2009   . Breast surgery Right 01/2012    "cancer"  . Breast lumpectomy  03/03/2012    Procedure: LUMPECTOMY;  Surgeon: Haywood Lasso, MD;  Location: [facility];  Service: General;  Laterality: Right;      Prior to Admission medications   Medication Sig Start Date End Date Taking? Authorizing Provider  aspirin EC 81 MG tablet Take 1 tablet (81 mg total) by mouth daily. Patient taking differently: Take 81 mg by mouth at bedtime.  08/12/13  Yes Adeline Saralyn Pilar, MD  CALCIUM PO Take 1 tablet by mouth daily.    Yes Historical Provider, MD  cholecalciferol (VITAMIN D) 1000 UNITS tablet Take 1 tablet (1,000 Units total) by mouth daily. 11/18/12  Yes Chauncey Cruel, MD  diclofenac sodium (VOLTAREN) 1 % GEL Apply 2 g topically 2 (two) times daily as needed (pain).    Yes Historical Provider, MD  esomeprazole (NEXIUM) 40 MG capsule Take 40 mg by mouth daily before breakfast.   Yes Historical Provider, MD  ferrous sulfate 325 (65 FE) MG EC tablet Take 325 mg by mouth daily with breakfast.   Yes Historical Provider, MD  gabapentin (NEURONTIN) 100 MG capsule Take 100-300 mg by mouth 3 (three) times daily. Takes 165m in am, 1045min pm, and 30036mt bedtime   Yes Historical Provider, MD  insulin aspart (NOVOLOG) 100 UNIT/ML injection Inject 0-6 Units into the skin 3 (three) times daily as needed for high blood sugar (200-299 = 4 units, 300 and above = 6 units). Sliding scale   Yes Historical Provider, MD  Iron-Vitamins (GERITOL COMPLETE) TABS Take 1 tablet by mouth daily.   Yes Historical Provider, MD  levothyroxine (SYNTHROID, LEVOTHROID) 150 MCG tablet Take 150 mcg by mouth daily before breakfast.   Yes Historical Provider, MD  lisinopril (PRINIVIL,ZESTRIL) 2.5 MG tablet Take 2.5 mg by mouth daily.   Yes Historical Provider, MD  LORazepam (ATIVAN) 2 MG tablet Take 1 tablet (2 mg total) by mouth daily as needed for anxiety. Patient taking differently: Take 2 mg by mouth daily.  08/16/14  Yes UluKinnie FeilD   meloxicam (MOBIC) 15 MG tablet Take 15 mg by mouth daily.    Yes Historical Provider, MD  NUCYNTA ER 200 MG TB12 Take 200 mg by mouth every 12 (twelve) hours. 09/18/14  Yes Historical Provider, MD  oxyCODONE (ROXICODONE) 15 MG immediate release tablet Take 1 tablet (15 mg total) by mouth every 8 (eight) hours as needed for pain. Patient taking differently: Take 15 mg by mouth 6 (six) times daily.  08/16/14  Yes UluKinnie FeilD  PARoxetine (PAXIL) 40 MG tablet Take 40 mg by mouth daily. 07/28/14  Yes Historical Provider, MD  pregabalin (LYRICA) 75 MG  capsule Take 2 capsules (150 mg total) by mouth at bedtime. 2 tablets at bedtime Patient taking differently: Take 150 mg by mouth every evening. 2 tablets at bedtime 08/16/14  Yes Kinnie Feil, MD  rosuvastatin (CRESTOR) 20 MG tablet Take 20 mg by mouth daily.   Yes Historical Provider, MD  sitaGLIPtin-metformin (JANUMET) 50-500 MG per tablet Take 1 tablet by mouth 2 (two) times daily with a meal.   Yes Historical Provider, MD  valACYclovir (VALTREX) 1000 MG tablet Take 1,000 mg by mouth 2 (two) times daily.    Yes Historical Provider, MD  valsartan-hydrochlorothiazide (DIOVAN-HCT) 160-12.5 MG per tablet Take 1 tablet by mouth daily.   Yes Historical Provider, MD  amoxicillin (AMOXIL) 500 MG capsule Take 1 capsule (500 mg total) by mouth 3 (three) times daily. 09/12/14   Domenic Moras, PA-C  lidocaine (XYLOCAINE) 2 % solution Use as directed 10 mLs in the mouth or throat as needed for mouth pain. 09/12/14   Domenic Moras, PA-C     Allergies  Allergen Reactions  . Shellfish Allergy Shortness Of Breath  . Bee Venom Hives  . Morphine And Related Other (See Comments)    Overly sedated    Social History:  reports that she quit smoking about 22 years ago. Her smoking use included Cigarettes. She has a 7 pack-year smoking history. She has never used smokeless tobacco. She reports that she does not drink alcohol or use illicit drugs.    Family History    Problem Relation Age of Onset  . Heart disease Brother     Multiple MIs, starting in his 12s  . Diabetes Brother   . Hypertension Mother   . Diabetes Mother   . Breast cancer Mother 71  . Bone cancer Mother   . Hypertension Sister   . Diabetes Sister   . Breast cancer Sister 5  . Breast cancer Maternal Grandmother   . Heart disease Maternal Grandmother   . Uterine cancer Other 19  . Breast cancer Paternal Aunt 99  . Breast cancer Paternal Grandmother     dx in her 16s  . Prostate cancer Paternal Grandfather        Physical Exam:  GEN:  Pleasant Morbidly Obese 59 y.o. African American female examined and in no acute distress; cooperative with exam Filed Vitals:   10/28/14 0115 10/28/14 0130 10/28/14 0145 10/28/14 0200  BP: 95/55 82/54 100/58 119/94  Pulse: 86 84 89 86  Temp:      TempSrc:      Resp: _0 Height:      Weight:      SpO2: 99% 100% 100% 100%   Blood pressure 119/94, pulse 86, temperature 98 F (36.7 C), temperature source Oral, resp. rate 12, height _1  (1.651 m), weight 127.007 kg (280 lb), SpO2 100 %. PSYCH: She is alert and oriented x4; does not appear anxious does not appear depressed; affect is normal HEENT: Normocephalic and Atraumatic, Mucous membranes pink; PERRLA; EOM intact; Fundi:  Benign;  No scleral icterus, Nares: Patent, Oropharynx: Clear, Edentulous on Upper Palate,    Neck:  FROM, No Cervical Lymphadenopathy nor Thyromegaly or Carotid Bruit; No JVD; Breasts:: Not examined CHEST WALL: No tenderness CHEST: Normal respiration, clear to auscultation bilaterally HEART: Regular rate and rhythm; no murmurs rubs or gallops BACK: No kyphosis or scoliosis; No CVA tenderness ABDOMEN: Positive Bowel Sounds, Obese, Soft Non-Tender, No Rebound or Guarding; No Masses, No Organomegaly Rectal Exam: Not done EXTREMITIES: No Cyanosis,  Clubbing, or Edema; No Ulcerations. Genitalia: not examined PULSES: 2+ and symmetric SKIN: Normal hydration no  rash or ulceration CNS:  Alert and Oriented x 4, No Focal Deficits Vascular: pulses palpable throughout    Labs on Admission:  Basic Metabolic Panel:  Recent Labs Lab 10/27/14 2223  NA 137  K 3.5  CL 110  CO2 20*  GLUCOSE 99  BUN 24*  CREATININE 1.31*  CALCIUM 7.8*   Liver Function Tests:  Recent Labs Lab 10/27/14 2223  AST 14*  ALT 14  ALKPHOS 52  BILITOT 0.2*  PROT 5.1*  ALBUMIN 2.5*   No results for input(s): LIPASE, AMYLASE in the last 168 hours. No results for input(s): AMMONIA in the last 168 hours. CBC:  Recent Labs Lab 10/27/14 2223  WBC 13.3*  NEUTROABS 9.4*  HGB 9.7*  HCT 29.3*  MCV 83.2  PLT 255   Cardiac Enzymes: No results for input(s): CKTOTAL, CKMB, CKMBINDEX, TROPONINI in the last 168 hours.  BNP (last 3 results) No results for input(s): BNP in the last 8760 hours.  ProBNP (last 3 results)  Recent Labs  01/06/14 1010  PROBNP 14.5    CBG: No results for input(s): GLUCAP in the last 168 hours.  Radiological Exams on Admission: Dg Chest Port 1 View  10/27/2014   CLINICAL DATA:  Shortness of breath and chest pain.  EXAM: PORTABLE CHEST - 1 VIEW  COMPARISON:  08/14/2014  FINDINGS: The heart size and mediastinal contours are within normal limits. Both lungs are clear. The visualized skeletal structures are unremarkable.  IMPRESSION: No active disease.   Electronically Signed   By: Kerby Moors M.D.   On: 10/27/2014 23:17     EKG: Independently reviewed. Sinus Tachycardia rate =104.           Assessment/Plan:      59 y.o. female with  Principal Problem:   1.    Hypotension- Multifactorial:  Sepsis vs Dehydration vs Overmedication   Sepsis workup initiated   Empiric IV Vancomycin and Zosyn   Check TSH, and Cortisol levels   IVFs   Hold Anti-Hypertensives   Evaluate how Patient takes her Pain Meds     Active Problems:   2.    Syncope- due to #1      3.    Sinus tachycardia- due to #1     4.    Anemia- On Iron  Supplements   Check FOBT   Monitor Hb Trend     5.   Type II diabetes mellitus   Hold Janumet Rx    SSI coverage PRN   Check HbA1C     6.    Polyneuropathy in diabetes(357.2)   Continue Gabapentin and Lyrica Rx     7.    Hypothyroidism   Continue Levothyroxine     8.    OSA on CPAP   CPAP qhs     9.    Chronic lower back pain- from DDD   On Tapentadol ER q 12 hrs   On Oxycodone IR 15 mg PO q4 hrs   On GAbapentin 100,g PO BID and 300 mg PO qhs     On Lyrica 150 mg total at bedtime    10.   DVT Prophylaxis   Lovenox    Code Status:     FULL CODE        Family Communication: No Family Present    Disposition Plan:    Inpatient Status  Time spent: [place] [facility] Pager [phone]   If [place] Please Contact the Day Rounding Team MD for [facility]  If 7PM-7AM, Please Contact Night-Floor Coverage  [email or web address] Password [email or web address] 10/28/2014, 2:46 AM     ADDENDUM:   Patient was seen and examined on 10/28/2014

## 2014-10-28 NOTE — Progress Notes (Signed)
Patient seen and examined. Admitted after midnight secondary to hypotension and syncope. Appears to be secondary to medications (pain meds and antihypertensive drugs); other considerations would be adrenal insufficiency and orthostatic hypotension. Will check cortisol. Will stop antihypertensive drugs and provide IVF's resuscitations. Decrease pain meds. Zero concerns for infection on her hx and so far work up findings, will stop antibiotics. Please referred to Dr. Arnoldo Morale H&P for further info/details on admission.  Barton Dubois [phone]

## 2014-10-29 ENCOUNTER — Inpatient Hospital Stay (HOSPITAL_COMMUNITY): Payer: Medicare Other

## 2014-10-29 ENCOUNTER — Encounter (HOSPITAL_COMMUNITY): Payer: Medicare Other

## 2014-10-29 DIAGNOSIS — M545 Low back pain: Secondary | ICD-10-CM

## 2014-10-29 DIAGNOSIS — G8929 Other chronic pain: Secondary | ICD-10-CM

## 2014-10-29 DIAGNOSIS — R55 Syncope and collapse: Secondary | ICD-10-CM

## 2014-10-29 DIAGNOSIS — E1140 Type 2 diabetes mellitus with diabetic neuropathy, unspecified: Secondary | ICD-10-CM

## 2014-10-29 DIAGNOSIS — E039 Hypothyroidism, unspecified: Secondary | ICD-10-CM

## 2014-10-29 DIAGNOSIS — G4733 Obstructive sleep apnea (adult) (pediatric): Secondary | ICD-10-CM

## 2014-10-29 DIAGNOSIS — G459 Transient cerebral ischemic attack, unspecified: Secondary | ICD-10-CM

## 2014-10-29 LAB — URINE CULTURE: CULTURE: NO GROWTH

## 2014-10-29 LAB — GLUCOSE, CAPILLARY
GLUCOSE-CAPILLARY: 140 mg/dL — AB (ref 65–99)
GLUCOSE-CAPILLARY: 105 mg/dL — AB (ref 65–99)
GLUCOSE-CAPILLARY: 148 mg/dL — AB (ref 65–99)
Glucose-Capillary: 110 mg/dL — ABNORMAL HIGH (ref 65–99)
Glucose-Capillary: 124 mg/dL — ABNORMAL HIGH (ref 65–99)

## 2014-10-29 LAB — VITAMIN B12: VITAMIN B 12: 821 pg/mL (ref 180–914)

## 2014-10-29 NOTE — Progress Notes (Signed)
[facility] PROGRESS NOTE  LANETTE ELL DXI:[id number] DOB: [birth year] DOA: 10/27/2014 PCP: [name], MD  Assessment/Plan: 1-syncope/near syncope: appears to be related to medications (especially recent resumption on antihypertensive drugs) -cortisol WNL -BP now stable and with systolic BP in 767'H range after IVF's -will continue holding antihypertensive drugs -will follow and monitor VS -continue reduce dose of chronic pain meds  2-HA's/TIA: symptoms resolved now -CT head neg for acute abnormalities  -will check EEG, MRI, carotid duplex and echo -patient advise to be compliant with CPAP -will monitor on telemetry  3-diabetes with neuropathy:  -will follow A1C -continue SSI while inpatient -will continue lyrica  4-chronic diastolic heart failure: compensated currently -will monitor daily weights and strict I's and O's -heart healthy diet  5-morbid obesity:  -Body mass index is 46.59 kg/(m^2). -low calorie diet and increase exercise discussed with patients  6-HTN: with episodes of hypotension -will continue holding antihypertensive agents -will monitor VS and check orthostatic, before deciding on needs of any agents   7-HLD: will continue statins  8-anxiety/depression: will continue use of paxil  9-GERD: will continue PPI  10-hypothyroidism: will continue synthroid -TSH WNL  Code Status: Full Family Communication: no family at bedside Disposition Plan: will transfer out of stepdown to a telemetry bed   Consultants:  None   Procedures:  See below for x-ray reports   Antibiotics:  One dose of vanc/zosyn on admission   HPI/Subjective: Afebrile, no CP and no SOB. Patient with chronic lower back pain and complaining of experiencing episode of facial numbness, HA and disorientation overnight.  Objective: Filed Vitals:   10/29/14 0800  BP: 122/68  Pulse: 81  Temp: 99.1 F (37.3 C)  Resp:     Intake/Output Summary (Last 24 hours) at  10/29/14 0900 Last data filed at 10/29/14 0600  Gross per 24 hour  Intake      0 ml  Output   2000 ml  Net  -2000 ml   Filed Weights   10/27/14 2207  Weight: 127.007 kg (280 lb)    Exam:   General:  Afebrile, denies cough, dysuria, nausea, vomiting or abd pain. Patient with transient episode of face numbness, disorientation and HA. Symptoms now completely resolved. According to patient she has been experiencing that on/off for the last 2 weeks PTA. No CP  Cardiovascular: S1 and S2, no rubs or gallops. No JVD  Respiratory: no wheezing, no crackles, good air movement   Abdomen: obese, soft, NT, ND, positive BS  Musculoskeletal: trace edema bilaterally, no cyanosis    Data Reviewed: Basic Metabolic Panel:  Recent Labs Lab 10/27/14 2223 10/28/14 0557  NA 137 141  K 3.5 4.6  CL 110 110  CO2 20* 22  GLUCOSE 99 143*  BUN 24* 23*  CREATININE 1.31* 1.47*  CALCIUM 7.8* 8.7*   Liver Function Tests:  Recent Labs Lab 10/27/14 2223  AST 14*  ALT 14  ALKPHOS 52  BILITOT 0.2*  PROT 5.1*  ALBUMIN 2.5*   CBC:  Recent Labs Lab 10/27/14 2223 10/28/14 0557  WBC 13.3* 13.4*  NEUTROABS 9.4*  --   HGB 9.7* 10.7*  HCT 29.3* 33.3*  MCV 83.2 84.5  PLT 255 257   ProBNP (last 3 results)  Recent Labs  01/06/14 1010  PROBNP 14.5    CBG:  Recent Labs Lab 10/28/14 0956 10/28/14 1538 10/28/14 2021 10/29/14 0806  GLUCAP 137* 140* 140* 110*    Recent Results (from the past 240 hour(s))  Blood Culture (routine x 2)  Status: None (Preliminary result)   Collection Time: 10/27/14 10:23 PM  Result Value Ref Range Status   Specimen Description BLOOD LEFT ARM  Final   Special Requests BOTTLES DRAWN AEROBIC AND ANAEROBIC 5CC  Final   Culture NO GROWTH < 12 HOURS  Final   Report Status PENDING  Incomplete  Blood Culture (routine x 2)     Status: None (Preliminary result)   Collection Time: 10/27/14 11:07 PM  Result Value Ref Range Status   Specimen Description  BLOOD LEFT HAND  Final   Special Requests BOTTLES DRAWN AEROBIC ONLY 5CC  Final   Culture NO GROWTH < 12 HOURS  Final   Report Status PENDING  Incomplete  Urine culture     Status: None   Collection Time: 10/28/14 12:33 AM  Result Value Ref Range Status   Specimen Description URINE, CLEAN CATCH  Final   Special Requests NONE  Final   Culture NO GROWTH 1 DAY  Final   Report Status 10/29/2014 FINAL  Final  MRSA PCR Screening     Status: None   Collection Time: 10/28/14  2:25 PM  Result Value Ref Range Status   MRSA by PCR NEGATIVE NEGATIVE Final    Comment:        The GeneXpert MRSA Assay (FDA approved for NASAL specimens only), is one component of a comprehensive MRSA colonization surveillance program. It is not intended to diagnose MRSA infection nor to guide or monitor treatment for MRSA infections.      Studies: Ct Head Wo Contrast  10/28/2014   CLINICAL DATA:  Syncope  EXAM: CT HEAD WITHOUT CONTRAST  TECHNIQUE: Contiguous axial images were obtained from the base of the skull through the vertex without intravenous contrast.  COMPARISON:  04/22/2012  FINDINGS: No mass effect, midline shift, or acute hemorrhage. Sella turcica remain somewhat expanded and fluid-filled. Cerumen fills the external auditory canals. No fracture. Mastoid air cells are clear. Visualized paranasal sinuses are clear. Intact cranium.  IMPRESSION: No acute intracranial pathology.   Electronically Signed   By: Marybelle Killings M.D.   On: 10/28/2014 09:20   Dg Chest Port 1 View  10/27/2014   CLINICAL DATA:  Shortness of breath and chest pain.  EXAM: PORTABLE CHEST - 1 VIEW  COMPARISON:  08/14/2014  FINDINGS: The heart size and mediastinal contours are within normal limits. Both lungs are clear. The visualized skeletal structures are unremarkable.  IMPRESSION: No active disease.   Electronically Signed   By: Kerby Moors M.D.   On: 10/27/2014 23:17    Scheduled Meds: . aspirin EC  81 mg Oral QHS  .  calcium-vitamin D  1 tablet Oral Q breakfast  . cholecalciferol  1,000 Units Oral Daily  . enoxaparin (LOVENOX) injection  40 mg Subcutaneous Q24H  . gabapentin  100 mg Oral BID  . gabapentin  300 mg Oral QHS  . insulin aspart  0-15 Units Subcutaneous TID WC  . levothyroxine  150 mcg Oral QAC breakfast  . pantoprazole  40 mg Oral Daily  . PARoxetine  40 mg Oral Daily  . pregabalin  150 mg Oral QPM  . rosuvastatin  20 mg Oral Daily  . tapentadol  75 mg Oral QID   Continuous Infusions: . sodium chloride 100 mL/hr at 10/28/14 2951    Principal Problem:   Hypotension Active Problems:   Hypothyroidism   Sinus tachycardia   Syncope   Anemia   Type II diabetes mellitus   OSA on CPAP  Chronic lower back pain   Polyneuropathy in diabetes(357.2)    Time spent: 30 minutes    Barton Dubois  [facility] Pager [phone]. If 7PM-7AM, please contact night-coverage at [email or web address], password [facility] 10/29/2014, 9:00 AM  LOS: 1 day

## 2014-10-29 NOTE — Progress Notes (Addendum)
Pt was asleep and woke up to use the bathroom. Pt co headache, nausea and left side of face numbness when returning back to bed. Vitals stable, face is symmetrical neuro is intact see doc flow sheet. MD made aware and advised to do neuro checks. Pt asking for something to eat despite feeling nauseated. Will continue to monitor closely.

## 2014-10-29 NOTE — Evaluation (Signed)
Physical Therapy Evaluation Patient Details Name: [name] MRN: [id number] DOB: 29-Jun-1955 Today's Date: 10/29/2014   History of Present Illness  Pt adm with syncopal episode. PMH - chronic back pain, morbid obesity, bil TKA, HTN  Clinical Impression  Pt admitted with above diagnosis and presents to PT with functional limitations due to deficits listed below (See PT problem list). Pt needs skilled PT to maximize independence and safety to allow discharge to home. Pt close to her baseline with mobility and doubt she will need any PT after DC.     Follow Up Recommendations No PT follow up    Equipment Recommendations  None recommended by PT    Recommendations for Other Services       Precautions / Restrictions        Mobility  Bed Mobility Overal bed mobility: Modified Independent                Transfers Overall transfer level: Needs assistance Equipment used: Rolling walker (2 wheeled) Transfers: Sit to/from Stand Sit to Stand: Supervision            Ambulation/Gait Ambulation/Gait assistance: Min guard Ambulation Distance (Feet): 50 Feet Assistive device: Rolling walker (2 wheeled) Gait Pattern/deviations: Step-through pattern;Decreased step length - right;Decreased step length - left;Trunk flexed   Gait velocity interpretation: Below normal speed for age/gender General Gait Details: Pt unable to stand fully erect due to chronic back pain  Stairs            Wheelchair Mobility    Modified Rankin (Stroke Patients Only)       Balance Overall balance assessment: Needs assistance Sitting-balance support: No upper extremity supported;Feet supported Sitting balance-Leahy Scale: Good     Standing balance support: Bilateral upper extremity supported Standing balance-Leahy Scale: Poor Standing balance comment: upper extremity support due to back pain.                             Pertinent Vitals/Pain Pain Assessment:  Faces Faces Pain Scale: Hurts little more Pain Location: back Pain Descriptors / Indicators: Other (Comment) (chronic) Pain Intervention(s): Limited activity within patient's tolerance;Monitored during session;Repositioned    Home Living Family/patient expects to be discharged to:: Private residence Living Arrangements: Alone Available Help at Discharge: Family;Available PRN/intermittently Type of Home: House Home Access: Stairs to enter Entrance Stairs-Rails: None Entrance Stairs-Number of Steps: 1 Home Layout: One level Home Equipment: Cane - single point;Walker - 2 wheels;Shower seat Additional Comments: Family comes by occasionally    Prior Function Level of Independence: Independent with assistive device(s)         Comments: Amb with rolling walker     Hand Dominance   Dominant Hand: Right    Extremity/Trunk Assessment   Upper Extremity Assessment: Overall WFL for tasks assessed           Lower Extremity Assessment: Generalized weakness         Communication   Communication: No difficulties  Cognition Arousal/Alertness: Awake/alert Behavior During Therapy: WFL for tasks assessed/performed Overall Cognitive Status: Within Functional Limits for tasks assessed                      General Comments      Exercises        Assessment/Plan    PT Assessment Patient needs continued PT services  PT Diagnosis Difficulty walking   PT Problem List Decreased strength;Decreased activity tolerance;Decreased balance;Decreased mobility;Pain  PT Treatment  Interventions DME instruction;Gait training;Functional mobility training;Therapeutic activities;Therapeutic exercise;Balance training;Patient/family education   PT Goals (Current goals can be found in the Care Plan section) Acute Rehab PT Goals Patient Stated Goal: return home PT Goal Formulation: With patient Time For Goal Achievement: 11/05/14 Potential to Achieve Goals: Good    Frequency Min  3X/week   Barriers to discharge        Co-evaluation               End of Session   Activity Tolerance: No increased pain Patient left: in bed;with call bell/phone within reach Nurse Communication: Mobility status         Time: 3817-7116 PT Time Calculation (min) (ACUTE ONLY): 18 min   Charges:   PT Evaluation $Initial PT Evaluation Tier I: 1 Procedure     PT G Codes:        Demitrus Francisco 11/20/2014, 1:42 PM  [facility] PT [phone]

## 2014-10-29 NOTE — Progress Notes (Signed)
Pt stated that she wants to use her NIV tonight, inform pt to notify her RN once she ready for bed and I'll come set up her NIV for her. Pt is stable at this time, no complications noted.

## 2014-10-30 ENCOUNTER — Inpatient Hospital Stay (HOSPITAL_COMMUNITY): Payer: Medicare Other

## 2014-10-30 DIAGNOSIS — I951 Orthostatic hypotension: Secondary | ICD-10-CM

## 2014-10-30 DIAGNOSIS — G459 Transient cerebral ischemic attack, unspecified: Secondary | ICD-10-CM

## 2014-10-30 DIAGNOSIS — E1142 Type 2 diabetes mellitus with diabetic polyneuropathy: Secondary | ICD-10-CM

## 2014-10-30 LAB — GLUCOSE, CAPILLARY
GLUCOSE-CAPILLARY: 112 mg/dL — AB (ref 65–99)
Glucose-Capillary: 126 mg/dL — ABNORMAL HIGH (ref 65–99)

## 2014-10-30 LAB — HEMOGLOBIN A1C
HEMOGLOBIN A1C: 7.5 % — AB (ref 4.8–5.6)
MEAN PLASMA GLUCOSE: 169 mg/dL

## 2014-10-30 MED ORDER — ESOMEPRAZOLE MAGNESIUM 40 MG PO CPDR: 40.0000 mg | DELAYED_RELEASE_CAPSULE | Freq: Every day | ORAL | Status: DC

## 2014-10-30 MED ORDER — LORAZEPAM 2 MG PO TABS: 2.0000 mg | ORAL_TABLET | Freq: Two times a day (BID) | ORAL | Status: DC | PRN

## 2014-10-30 NOTE — Progress Notes (Signed)
*  PRELIMINARY RESULTS* Echocardiogram 2D Echocardiogram has been performed.  Leavy Cella 10/30/2014, 12:20 PM

## 2014-10-30 NOTE — Progress Notes (Signed)
EEG Completed; Results Pending

## 2014-10-30 NOTE — Care Management Note (Signed)
Case Management Note  Patient Details  Name: [name] MRN: [id number] Date of Birth: [birth year]  Subjective/Objective:  Pt admitted for hypotension. IVF Bolus administered.                   Action/Plan: No needs from CM at this time. CM will continue to monitor.   Expected Discharge Date:                  Expected Discharge Plan:  Home/Self Care  In-House Referral:     Discharge planning Services  CM Consult  Post Acute Care Choice:    Choice offered to:     DME Arranged:    DME Agency:     HH Arranged:    HH Agency:     Status of Service:  In process, will continue to follow  Medicare Important Message Given:    Date Medicare IM Given:    Medicare IM give by:    Date Additional Medicare IM Given:    Additional Medicare Important Message give by:     If discussed at [place] of Stay Meetings, dates discussed:    Additional Comments:  Bethena Roys, RN 10/30/2014, 11:52 AM

## 2014-10-30 NOTE — Progress Notes (Signed)
VASCULAR LAB PRELIMINARY  PRELIMINARY  PRELIMINARY  PRELIMINARY  Carotid duplex completed.    Preliminary report:  1-39% plaquing.  Vertebral artery flow is antegrade.   Tremont Gavitt, RVT 10/30/2014, 9:48 AM

## 2014-10-30 NOTE — Progress Notes (Signed)
Pt is on CPAP at this time. Pt setting is 5. Pt is on RA with CPAP o2 saturations are acceptable at 97%. Pt is stable at this time no complcations noted, pt tolerating CPAP mask well.

## 2014-10-30 NOTE — Clinical Documentation Improvement (Signed)
Internal Medicine  Can the diagnosis of CKD be further specified?   CKD Stage I - GFR greater than or equal to 90  CKD Stage II - GFR 60-89  CKD Stage III - GFR 30-59  CKD Stage IV - GFR 15-29  CKD Stage V - GFR < 15  ESRD (End Stage Renal Disease)  Other condition  Unable to clinically determine   Supporting Information: : (risk factors, signs and symptoms, diagnostics, treatment) 10/27/14 GFR= 51 10/28/14 GFR= 44  Please exercise your independent, professional judgment when responding. A specific answer is not anticipated or expected.   Thank You, Rolm Gala, RN, BSN Health Information Management [email or web address] [phone]

## 2014-10-30 NOTE — Discharge Summary (Signed)
Physician Discharge Summary  [name] AYT:[id number] DOB: 09/22/1955 DOA: 10/27/2014  PCP: [name], MD  Admit date: 10/27/2014 Discharge date: 10/30/2014  Time spent:  >30 minutes  Recommendations for Outpatient Follow-up:  1. Reassess BP and restart some antihypertensive drugs as needed (might no need more than 1 or 2 medications at low dose) 2. Repeat BMET to follow electrolytes and renal function 3. CBC to follow Hgb and WBC's trend  Discharge Diagnoses:  Principal Problem:   Hypotension Active Problems:   Hypothyroidism   Sinus tachycardia   Syncope   Anemia   Type II diabetes mellitus   OSA on CPAP   Chronic lower back pain   Polyneuropathy in diabetes(357.2)   TIA (transient ischemic attack) acute on CKD stage 3  Discharge Condition: stable and improved. Discharge home with instruction to follow with PCP in 10 days  Diet recommendation: heart healthy, low calorie and low carbohydrates diet  Filed Weights   10/27/14 2207 10/29/14 1414 10/30/14 0428  Weight: 127.007 kg (280 lb) 133.04 kg (293 lb 4.8 oz) 132.269 kg (291 lb 9.6 oz)    History of present illness:  59 y.o. female with a history of HTN, DM2, Hypothyroid, CKD, OSA and Previous Breast Ca S/P Lumpectomy and Radiation Rx who complains of Light-Headedness and Dizziness for the past 2 weeks. She reports having a syncopal episode this evening, and had a previous episode 2 weeks ago. She reports having a dull headache on the right side of her head x 2 weeks as well. She had N+V x 1 this AM. She denies fevers and Chills. In the ED, she was found to have hypotension, which only temporarily improved with Fluids.  Hospital Course:  1-syncope/near syncope: appears to be related to medications (especially recent resumption on antihypertensive drugs) -cortisol WNL -BP now stable and with systolic BP in 355'D-322'G range  -will continue holding antihypertensive drugs at discharge -advise to follow  with PCP for reassessment and if needed re-initiation of lower antihypertenisve agents -advise to use chronic pain meds as prescribed and if possible to stretch/minimize use   2-HA's/TIA: symptoms resolved now -CT head neg for acute abnormalities  -normal EEG, MRI, and no source of emboli/abnormalities on echo -patient advise to be compliant with CPAP -will need adjustment to BP medications (at discharge discontinued)   3-diabetes with neuropathy:  - A1C 7.5 -continue SSI  -will continue lyrica  4-chronic diastolic heart failure: grade 2; compensated currently -encourage to follow low sodium diet and to check daily weights  -low dose lasix if needed can be added at follow up visit base on patient's volume status  5-morbid obesity:  -Body mass index is 46.59 kg/(m^2). -low calorie diet and increase exercise discussed with patients  6-HTN: with episodes of hypotension while on this meds -will continue holding antihypertensive agents at discharge Patient advise to follow low sodium diet -she will follow with PCP to reassess BP and if needed, being started on low dose medications. -no orthostatic changes at discharge  7-HLD: will continue statins  8-anxiety/depression: will continue use of paxil and PRN lorazepam   9-GERD: will continue PPI  10-hypothyroidism: will continue synthroid -TSH WNL  11-acute on CKD stage 3: due to hypotension and continue use of nephrotoxic agents -patient received IVF's -BP now stable -will hold ARB/ACE inhibitors and HCTZ  12-mild ICA stenosis: bilaterally -minimal ICA stenosis on carotid duplex; continue asa, statins and life-style modifications  Procedures:  EEG: normal awake and Asleep EEG; no findings suggesting epilepsy  Carotid Duplex: 1-39% ICA stenosis bilaterally, vertebral flow antegrade   2-D echo:  - Left ventricle: The cavity size was normal. There was moderate concentric hypertrophy. Systolic function was normal.  The estimated ejection fraction was in the range of 55% to 60%. Wall motion was normal; there were no regional wall motion abnormalities. Features are consistent with a pseudonormal left ventricular filling pattern, with concomitant abnormal relaxation and increased filling pressure (grade 2 diastolic dysfunction).  Consultations:  None   Discharge Exam: Filed Vitals:   10/30/14 1234  BP: 134/74  Pulse: 93  Temp: 99.7 F (37.6 C)  Resp:     General: Afebrile, denies cough, dysuria, nausea, vomiting or abd pain. Patient without CP or SOB. No further episodes of numbness  Cardiovascular: S1 and S2, no rubs or gallops. No JVD  Respiratory: no wheezing, no crackles, good air movement  Abdomen: obese, soft, NT, ND, positive BS  Musculoskeletal: trace edema bilaterally, no cyanosis  Discharge Instructions   Discharge Instructions    Diet - low sodium heart healthy    Complete by:  As directed      Discharge instructions    Complete by:  As directed   Take medications as prescribed Hold antihypertensive drugs until follow up with PCP Follow low calorie diet and low sodium diet Please maintain adequate hydration Take your time when switching from laying or sitting position to standing  Wear your CPAP every night          Current Discharge Medication List    CONTINUE these medications which have CHANGED   Details  esomeprazole (NEXIUM) 40 MG capsule Take 1 capsule (40 mg total) by mouth daily before breakfast. Qty: 30 capsule, Refills: 1    LORazepam (ATIVAN) 2 MG tablet Take 1 tablet (2 mg total) by mouth every 12 (twelve) hours as needed for anxiety. Refills: 0      CONTINUE these medications which have NOT CHANGED   Details  aspirin EC 81 MG tablet Take 1 tablet (81 mg total) by mouth daily. Qty: 30 tablet, Refills: 0    CALCIUM PO Take 1 tablet by mouth daily.     cholecalciferol (VITAMIN D) 1000 UNITS tablet Take 1 tablet (1,000 Units total) by  mouth daily. Qty: 30 tablet, Refills: 12    diclofenac sodium (VOLTAREN) 1 % GEL Apply 2 g topically 2 (two) times daily as needed (pain).     ferrous sulfate 325 (65 FE) MG EC tablet Take 325 mg by mouth daily with breakfast.    gabapentin (NEURONTIN) 100 MG capsule Take 100-300 mg by mouth 3 (three) times daily. Takes 151m in am, 1078min pm, and 30037mt bedtime    insulin aspart (NOVOLOG) 100 UNIT/ML injection Inject 0-6 Units into the skin 3 (three) times daily as needed for high blood sugar (200-299 = 4 units, 300 and above = 6 units). Sliding scale    Iron-Vitamins (GERITOL COMPLETE) TABS Take 1 tablet by mouth daily.    levothyroxine (SYNTHROID, LEVOTHROID) 150 MCG tablet Take 150 mcg by mouth daily before breakfast.    meloxicam (MOBIC) 15 MG tablet Take 15 mg by mouth daily.     NUCYNTA ER 200 MG TB12 Take 200 mg by mouth every 12 (twelve) hours.    oxyCODONE (ROXICODONE) 15 MG immediate release tablet Take 1 tablet (15 mg total) by mouth every 8 (eight) hours as needed for pain. Qty: 30 tablet, Refills: 0    PARoxetine (PAXIL) 40 MG tablet Take 40 mg  by mouth daily.    pregabalin (LYRICA) 75 MG capsule Take 2 capsules (150 mg total) by mouth at bedtime. 2 tablets at bedtime    rosuvastatin (CRESTOR) 20 MG tablet Take 20 mg by mouth daily.    sitaGLIPtin-metformin (JANUMET) 50-500 MG per tablet Take 1 tablet by mouth 2 (two) times daily with a meal.    valACYclovir (VALTREX) 1000 MG tablet Take 1,000 mg by mouth 2 (two) times daily.     lidocaine (XYLOCAINE) 2 % solution Use as directed 10 mLs in the mouth or throat as needed for mouth pain. Qty: 200 mL, Refills: 0      STOP taking these medications     lisinopril (PRINIVIL,ZESTRIL) 2.5 MG tablet      valsartan-hydrochlorothiazide (DIOVAN-HCT) 160-12.5 MG per tablet      amoxicillin (AMOXIL) 500 MG capsule        Allergies  Allergen Reactions  . Shellfish Allergy Shortness Of Breath  . Bee Venom Hives  .  Morphine And Related Other (See Comments)    Overly sedated   Follow-up Information    Follow up with SANDERS,ROBYN N, MD. Schedule an appointment as soon as possible for a visit in 10 days.   Specialty:  Internal Medicine   Contact information:   [address] [place] [address] [phone]       The results of significant diagnostics from this hospitalization (including imaging, microbiology, ancillary and laboratory) are listed below for reference.    Significant Diagnostic Studies: Ct Head Wo Contrast  10/28/2014   CLINICAL DATA:  Syncope  EXAM: CT HEAD WITHOUT CONTRAST  TECHNIQUE: Contiguous axial images were obtained from the base of the skull through the vertex without intravenous contrast.  COMPARISON:  04/22/2012  FINDINGS: No mass effect, midline shift, or acute hemorrhage. Sella turcica remain somewhat expanded and fluid-filled. Cerumen fills the external auditory canals. No fracture. Mastoid air cells are clear. Visualized paranasal sinuses are clear. Intact cranium.  IMPRESSION: No acute intracranial pathology.   Electronically Signed   By: Marybelle Killings M.D.   On: 10/28/2014 09:20   Mr Brain Wo Contrast  10/29/2014   CLINICAL DATA:  TIA. Episodes of dizziness lightheadedness or last 2 weeks. Right-sided headaches 2 weeks. Syncopal episode.  EXAM: MRI HEAD WITHOUT CONTRAST  TECHNIQUE: Multiplanar, multiecho pulse sequences of the brain and surrounding structures were obtained without intravenous contrast.  COMPARISON:  CT head without contrast 10/28/2014. MRI brain 06/05/2011.  FINDINGS: A relatively empty sella is again noted. No acute infarct, hemorrhage, or mass lesion is present. The ventricles are of normal size. Insert pass fluid  The basal ganglia and brainstem are within normal limits. Flow is present in the major intracranial arteries. Exophthalmos is again seen bilaterally. There is straightening of the optic nerves. No mass lesion is present. Minimal  mucosal thickening is present in the left maxillary sinus, left sphenoid sinus, and anterior left ethmoid air cells. There is some fluid in left mastoid air cells as well. No obstructing nasopharyngeal lesion is present.  Midline structures are unremarkable.  IMPRESSION: 1. No acute or focal lesion to explain the patient's symptoms. 2. Normal MRI appearance brain. 3. Bilateral exophthalmos is again noted. No focal retrobulbar mass lesion is present. This could be related to thyroid disease. Laboratory results suggests the patient is euthyroid.   Electronically Signed   By: San Morelle M.D.   On: 10/29/2014 10:55   Dg Chest Port 1 View  10/27/2014   CLINICAL DATA:  Shortness of breath and chest pain.  EXAM: PORTABLE CHEST - 1 VIEW  COMPARISON:  08/14/2014  FINDINGS: The heart size and mediastinal contours are within normal limits. Both lungs are clear. The visualized skeletal structures are unremarkable.  IMPRESSION: No active disease.   Electronically Signed   By: Kerby Moors M.D.   On: 10/27/2014 23:17    Microbiology: Recent Results (from the past 240 hour(s))  Blood Culture (routine x 2)     Status: None (Preliminary result)   Collection Time: 10/27/14 10:23 PM  Result Value Ref Range Status   Specimen Description BLOOD LEFT ARM  Final   Special Requests BOTTLES DRAWN AEROBIC AND ANAEROBIC 5CC  Final   Culture NO GROWTH 3 DAYS  Final   Report Status PENDING  Incomplete  Blood Culture (routine x 2)     Status: None (Preliminary result)   Collection Time: 10/27/14 11:07 PM  Result Value Ref Range Status   Specimen Description BLOOD LEFT HAND  Final   Special Requests BOTTLES DRAWN AEROBIC ONLY 5CC  Final   Culture NO GROWTH 3 DAYS  Final   Report Status PENDING  Incomplete  Urine culture     Status: None   Collection Time: 10/28/14 12:33 AM  Result Value Ref Range Status   Specimen Description URINE, CLEAN CATCH  Final   Special Requests NONE  Final   Culture NO GROWTH 1 DAY   Final   Report Status 10/29/2014 FINAL  Final  MRSA PCR Screening     Status: None   Collection Time: 10/28/14  2:25 PM  Result Value Ref Range Status   MRSA by PCR NEGATIVE NEGATIVE Final    Comment:        The GeneXpert MRSA Assay (FDA approved for NASAL specimens only), is one component of a comprehensive MRSA colonization surveillance program. It is not intended to diagnose MRSA infection nor to guide or monitor treatment for MRSA infections.      Labs: Basic Metabolic Panel:  Recent Labs Lab 10/27/14 2223 10/28/14 0557  NA 137 141  K 3.5 4.6  CL 110 110  CO2 20* 22  GLUCOSE 99 143*  BUN 24* 23*  CREATININE 1.31* 1.47*  CALCIUM 7.8* 8.7*   Liver Function Tests:  Recent Labs Lab 10/27/14 2223  AST 14*  ALT 14  ALKPHOS 52  BILITOT 0.2*  PROT 5.1*  ALBUMIN 2.5*   CBC:  Recent Labs Lab 10/27/14 2223 10/28/14 0557  WBC 13.3* 13.4*  NEUTROABS 9.4*  --   HGB 9.7* 10.7*  HCT 29.3* 33.3*  MCV 83.2 84.5  PLT 255 257   ProBNP (last 3 results)  Recent Labs  01/06/14 1010  PROBNP 14.5    CBG:  Recent Labs Lab 10/29/14 1148 10/29/14 1714 10/29/14 2010 10/30/14 0748 10/30/14 1131  GLUCAP 124* 105* 148* 112* 126*    Signed:  Barton Dubois  [facility] 10/30/2014, 3:49 PM

## 2014-10-30 NOTE — Progress Notes (Signed)
UR Completed Journee Kohen Graves-Bigelow, RN,BSN [phone]  

## 2014-10-30 NOTE — Procedures (Signed)
EEG report.  Brief clinical history: 59 y/o admitted secondary to hypotension and syncope.  Technique: this is a 17 channel routine scalp EEG performed at the bedside with bipolar and monopolar montages arranged in accordance to the international 10/20 system of electrode placement. One channel was dedicated to EKG recording.  The study was performed during wakefulness, drowsiness, and stage 2 sleep. Intermittent photic stimulation was utilized as activating procedure.  Description:In the wakeful state, the best background consisted of a medium amplitude, posterior dominant, well sustained, symmetric and reactive 9 Hz rhythm. Drowsiness demonstrated dropout of the alpha rhythm. Stage 2 sleep showed symmetric and synchronous sleep spindles without intermixed epileptiform discharges. Intermittent photic stimulation did induce a normal driving response.  No focal or generalized epileptiform discharges noted.  No pathologic areas of slowing seen.  EKG showed sinus rhythm.  Impression: this is a normal awake and asleep EEG. Please, be aware that a normal EEG does not exclude the possibility of epilepsy.  Clinical correlation is advised.   Dorian Pod, MD

## 2014-11-01 LAB — CULTURE, BLOOD (ROUTINE X 2)
Culture: NO GROWTH
Culture: NO GROWTH

## 2014-11-07 ENCOUNTER — Telehealth: Payer: Self-pay | Admitting: Oncology

## 2014-11-07 NOTE — Telephone Encounter (Signed)
Returned Advertising account executive. Left message confirming all appointment for same day in November.

## 2014-11-10 ENCOUNTER — Encounter (HOSPITAL_COMMUNITY): Payer: Self-pay | Admitting: Internal Medicine

## 2014-11-12 ENCOUNTER — Inpatient Hospital Stay (HOSPITAL_COMMUNITY): Payer: Medicare Other

## 2014-11-12 ENCOUNTER — Emergency Department (HOSPITAL_COMMUNITY): Payer: Medicare Other

## 2014-11-12 ENCOUNTER — Encounter (HOSPITAL_COMMUNITY): Payer: Self-pay | Admitting: *Deleted

## 2014-11-12 ENCOUNTER — Observation Stay (HOSPITAL_COMMUNITY)
Admission: EM | Admit: 2014-11-12 | Discharge: 2014-11-15 | Disposition: A | Discharge status: Skilled Nursing Facility | Payer: Medicare Other | Attending: Internal Medicine | Admitting: Internal Medicine

## 2014-11-12 DIAGNOSIS — R296 Repeated falls: Secondary | ICD-10-CM | POA: Diagnosis not present

## 2014-11-12 DIAGNOSIS — Z79899 Other long term (current) drug therapy: Secondary | ICD-10-CM | POA: Insufficient documentation

## 2014-11-12 DIAGNOSIS — F4310 Post-traumatic stress disorder, unspecified: Secondary | ICD-10-CM | POA: Clinically undetermined

## 2014-11-12 DIAGNOSIS — Z791 Long term (current) use of non-steroidal anti-inflammatories (NSAID): Secondary | ICD-10-CM | POA: Diagnosis not present

## 2014-11-12 DIAGNOSIS — N183 Chronic kidney disease, stage 3 unspecified: Secondary | ICD-10-CM | POA: Diagnosis present

## 2014-11-12 DIAGNOSIS — N1832 Chronic kidney disease, stage 3b: Secondary | ICD-10-CM | POA: Diagnosis present

## 2014-11-12 DIAGNOSIS — Z7982 Long term (current) use of aspirin: Secondary | ICD-10-CM | POA: Insufficient documentation

## 2014-11-12 DIAGNOSIS — F329 Major depressive disorder, single episode, unspecified: Secondary | ICD-10-CM | POA: Diagnosis not present

## 2014-11-12 DIAGNOSIS — G219 Secondary parkinsonism, unspecified: Secondary | ICD-10-CM | POA: Diagnosis not present

## 2014-11-12 DIAGNOSIS — Z6841 Body Mass Index (BMI) 40.0 and over, adult: Secondary | ICD-10-CM | POA: Insufficient documentation

## 2014-11-12 DIAGNOSIS — M549 Dorsalgia, unspecified: Secondary | ICD-10-CM | POA: Insufficient documentation

## 2014-11-12 DIAGNOSIS — G4733 Obstructive sleep apnea (adult) (pediatric): Secondary | ICD-10-CM | POA: Diagnosis not present

## 2014-11-12 DIAGNOSIS — R4182 Altered mental status, unspecified: Secondary | ICD-10-CM

## 2014-11-12 DIAGNOSIS — I129 Hypertensive chronic kidney disease with stage 1 through stage 4 chronic kidney disease, or unspecified chronic kidney disease: Secondary | ICD-10-CM | POA: Diagnosis not present

## 2014-11-12 DIAGNOSIS — M545 Low back pain, unspecified: Secondary | ICD-10-CM | POA: Diagnosis present

## 2014-11-12 DIAGNOSIS — E1122 Type 2 diabetes mellitus with diabetic chronic kidney disease: Secondary | ICD-10-CM | POA: Insufficient documentation

## 2014-11-12 DIAGNOSIS — G9340 Encephalopathy, unspecified: Secondary | ICD-10-CM | POA: Diagnosis not present

## 2014-11-12 DIAGNOSIS — G8929 Other chronic pain: Secondary | ICD-10-CM | POA: Diagnosis not present

## 2014-11-12 DIAGNOSIS — F32A Depression, unspecified: Secondary | ICD-10-CM | POA: Diagnosis present

## 2014-11-12 DIAGNOSIS — E1142 Type 2 diabetes mellitus with diabetic polyneuropathy: Secondary | ICD-10-CM | POA: Insufficient documentation

## 2014-11-12 DIAGNOSIS — K219 Gastro-esophageal reflux disease without esophagitis: Secondary | ICD-10-CM | POA: Diagnosis present

## 2014-11-12 DIAGNOSIS — Z853 Personal history of malignant neoplasm of breast: Secondary | ICD-10-CM | POA: Insufficient documentation

## 2014-11-12 DIAGNOSIS — R471 Dysarthria and anarthria: Secondary | ICD-10-CM | POA: Insufficient documentation

## 2014-11-12 DIAGNOSIS — J4530 Mild persistent asthma, uncomplicated: Secondary | ICD-10-CM | POA: Diagnosis present

## 2014-11-12 DIAGNOSIS — I10 Essential (primary) hypertension: Secondary | ICD-10-CM | POA: Diagnosis not present

## 2014-11-12 DIAGNOSIS — Z79891 Long term (current) use of opiate analgesic: Secondary | ICD-10-CM | POA: Insufficient documentation

## 2014-11-12 DIAGNOSIS — D649 Anemia, unspecified: Secondary | ICD-10-CM | POA: Diagnosis not present

## 2014-11-12 DIAGNOSIS — Z923 Personal history of irradiation: Secondary | ICD-10-CM | POA: Insufficient documentation

## 2014-11-12 DIAGNOSIS — G459 Transient cerebral ischemic attack, unspecified: Secondary | ICD-10-CM | POA: Diagnosis present

## 2014-11-12 DIAGNOSIS — E119 Type 2 diabetes mellitus without complications: Secondary | ICD-10-CM

## 2014-11-12 DIAGNOSIS — R4781 Slurred speech: Secondary | ICD-10-CM | POA: Insufficient documentation

## 2014-11-12 DIAGNOSIS — Z87891 Personal history of nicotine dependence: Secondary | ICD-10-CM | POA: Diagnosis not present

## 2014-11-12 DIAGNOSIS — E039 Hypothyroidism, unspecified: Secondary | ICD-10-CM | POA: Insufficient documentation

## 2014-11-12 DIAGNOSIS — Z8673 Personal history of transient ischemic attack (TIA), and cerebral infarction without residual deficits: Secondary | ICD-10-CM | POA: Diagnosis not present

## 2014-11-12 DIAGNOSIS — E785 Hyperlipidemia, unspecified: Secondary | ICD-10-CM | POA: Insufficient documentation

## 2014-11-12 DIAGNOSIS — E669 Obesity, unspecified: Secondary | ICD-10-CM | POA: Diagnosis not present

## 2014-11-12 DIAGNOSIS — Z794 Long term (current) use of insulin: Secondary | ICD-10-CM | POA: Insufficient documentation

## 2014-11-12 DIAGNOSIS — J45909 Unspecified asthma, uncomplicated: Secondary | ICD-10-CM | POA: Diagnosis not present

## 2014-11-12 DIAGNOSIS — Z9989 Dependence on other enabling machines and devices: Secondary | ICD-10-CM

## 2014-11-12 DIAGNOSIS — M25569 Pain in unspecified knee: Secondary | ICD-10-CM | POA: Diagnosis present

## 2014-11-12 DIAGNOSIS — F209 Schizophrenia, unspecified: Secondary | ICD-10-CM | POA: Insufficient documentation

## 2014-11-12 DIAGNOSIS — D72829 Elevated white blood cell count, unspecified: Secondary | ICD-10-CM | POA: Diagnosis present

## 2014-11-12 HISTORY — DX: Chronic kidney disease, stage 3 unspecified: N18.30

## 2014-11-12 HISTORY — DX: Chronic kidney disease, stage 3 (moderate): N18.3

## 2014-11-12 LAB — COMPREHENSIVE METABOLIC PANEL
ALT: 21 U/L (ref 14–54)
ANION GAP: 11 (ref 5–15)
AST: 19 U/L (ref 15–41)
Albumin: 3.6 g/dL (ref 3.5–5.0)
Alkaline Phosphatase: 76 U/L (ref 38–126)
BUN: 26 mg/dL — ABNORMAL HIGH (ref 6–20)
CHLORIDE: 104 mmol/L (ref 101–111)
CO2: 22 mmol/L (ref 22–32)
Calcium: 9.4 mg/dL (ref 8.9–10.3)
Creatinine, Ser: 1.41 mg/dL — ABNORMAL HIGH (ref 0.44–1.00)
GFR calc non Af Amer: 40 mL/min — ABNORMAL LOW (ref >60)
GFR, EST AFRICAN AMERICAN: 47 mL/min — AB (ref >60)
Glucose, Bld: 110 mg/dL — ABNORMAL HIGH (ref 65–99)
POTASSIUM: 4.4 mmol/L (ref 3.5–5.1)
SODIUM: 137 mmol/L (ref 135–145)
Total Bilirubin: 0.2 mg/dL — ABNORMAL LOW (ref 0.3–1.2)
Total Protein: 6.7 g/dL (ref 6.5–8.1)

## 2014-11-12 LAB — APTT: aPTT: 27 seconds (ref 24–37)

## 2014-11-12 LAB — ETHANOL

## 2014-11-12 LAB — I-STAT ARTERIAL BLOOD GAS, ED
Acid-Base Excess: 2 mmol/L (ref 0.0–2.0)
BICARBONATE: 26.9 meq/L — AB (ref 20.0–24.0)
Bicarbonate: 26 mEq/L — ABNORMAL HIGH (ref 20.0–24.0)
O2 SAT: 90 %
O2 Saturation: 94 %
PCO2 ART: 45.1 mmHg — AB (ref 35.0–45.0)
PCO2 ART: 42.5 mmHg (ref 35.0–45.0)
PO2 ART: 61 mmHg — AB (ref 80.0–100.0)
PO2 ART: 72 mmHg — AB (ref 80.0–100.0)
Patient temperature: 98.6
Patient temperature: 98.6
TCO2: 27 mmol/L (ref 0–100)
TCO2: 28 mmol/L (ref 0–100)
pH, Arterial: 7.37 (ref 7.350–7.450)
pH, Arterial: 7.41 (ref 7.350–7.450)

## 2014-11-12 LAB — DIFFERENTIAL
BASOS PCT: 0 %
Basophils Absolute: 0 10*3/uL (ref 0.0–0.1)
EOS PCT: 1 %
Eosinophils Absolute: 0.2 10*3/uL (ref 0.0–0.7)
Lymphocytes Relative: 15 %
Lymphs Abs: 2.5 10*3/uL (ref 0.7–4.0)
MONO ABS: 0.7 10*3/uL (ref 0.1–1.0)
Monocytes Relative: 4 %
NEUTROS ABS: 14 10*3/uL — AB (ref 1.7–7.7)
NEUTROS PCT: 80 %

## 2014-11-12 LAB — CBC
HCT: 34.9 % — ABNORMAL LOW (ref 36.0–46.0)
Hemoglobin: 11 g/dL — ABNORMAL LOW (ref 12.0–15.0)
MCH: 27.6 pg (ref 26.0–34.0)
MCHC: 31.5 g/dL (ref 30.0–36.0)
MCV: 87.5 fL (ref 78.0–100.0)
PLATELETS: 297 10*3/uL (ref 150–400)
RBC: 3.99 MIL/uL (ref 3.87–5.11)
RDW: 18.6 % — AB (ref 11.5–15.5)
WBC: 17.5 10*3/uL — AB (ref 4.0–10.5)

## 2014-11-12 LAB — URINALYSIS, ROUTINE W REFLEX MICROSCOPIC
GLUCOSE, UA: NEGATIVE mg/dL
Ketones, ur: NEGATIVE mg/dL
Leukocytes, UA: NEGATIVE
Nitrite: NEGATIVE
PH: 5 (ref 5.0–8.0)
Protein, ur: 30 mg/dL — AB
SPECIFIC GRAVITY, URINE: 1.026 (ref 1.005–1.030)
UROBILINOGEN UA: 0.2 mg/dL (ref 0.0–1.0)

## 2014-11-12 LAB — URINE MICROSCOPIC-ADD ON

## 2014-11-12 LAB — RAPID URINE DRUG SCREEN, HOSP PERFORMED
AMPHETAMINES: NOT DETECTED
BENZODIAZEPINES: POSITIVE — AB
Barbiturates: NOT DETECTED
COCAINE: NOT DETECTED
OPIATES: POSITIVE — AB
Tetrahydrocannabinol: NOT DETECTED

## 2014-11-12 LAB — I-STAT CHEM 8, ED
BUN: 29 mg/dL — ABNORMAL HIGH (ref 6–20)
CALCIUM ION: 1.16 mmol/L (ref 1.12–1.23)
CHLORIDE: 103 mmol/L (ref 101–111)
Creatinine, Ser: 1.4 mg/dL — ABNORMAL HIGH (ref 0.44–1.00)
Glucose, Bld: 110 mg/dL — ABNORMAL HIGH (ref 65–99)
HEMATOCRIT: 38 % (ref 36.0–46.0)
Hemoglobin: 12.9 g/dL (ref 12.0–15.0)
Potassium: 4.3 mmol/L (ref 3.5–5.1)
SODIUM: 138 mmol/L (ref 135–145)
TCO2: 23 mmol/L (ref 0–100)

## 2014-11-12 LAB — PROTIME-INR
INR: 1.05 (ref 0.00–1.49)
PROTHROMBIN TIME: 13.9 s (ref 11.6–15.2)

## 2014-11-12 LAB — I-STAT TROPONIN, ED: Troponin i, poc: 0 ng/mL (ref 0.00–0.08)

## 2014-11-12 LAB — AMMONIA: AMMONIA: 14 umol/L (ref 9–35)

## 2014-11-12 MED ORDER — INSULIN DETEMIR 100 UNIT/ML ~~LOC~~ SOLN
5.0000 [IU] | Freq: Every day | SUBCUTANEOUS | Status: DC
  Administered 2014-11-13 – 2014-11-14 (×3): 5 [IU] via SUBCUTANEOUS
  Filled 2014-11-12 (×4): qty 0.05

## 2014-11-12 MED ORDER — LEVOTHYROXINE SODIUM 100 MCG IV SOLR: 75.0000 ug | Freq: Every day | INTRAVENOUS | Status: DC

## 2014-11-12 MED ORDER — ONDANSETRON HCL 4 MG/2ML IJ SOLN: 4.0000 mg | Freq: Four times a day (QID) | INTRAMUSCULAR | Status: DC | PRN

## 2014-11-12 MED ORDER — SODIUM CHLORIDE 0.9 % IJ SOLN
3.0000 mL | Freq: Two times a day (BID) | INTRAMUSCULAR | Status: DC
  Administered 2014-11-13 – 2014-11-15 (×4): 3 mL via INTRAVENOUS

## 2014-11-12 MED ORDER — ALBUTEROL SULFATE (2.5 MG/3ML) 0.083% IN NEBU: 2.5000 mg | INHALATION_SOLUTION | RESPIRATORY_TRACT | Status: DC | PRN

## 2014-11-12 MED ORDER — TAPENTADOL HCL ER 200 MG PO TB12: 200.0000 mg | ORAL_TABLET | Freq: Two times a day (BID) | ORAL | Status: DC

## 2014-11-12 MED ORDER — HEPARIN SODIUM (PORCINE) 5000 UNIT/ML IJ SOLN
5000.0000 [IU] | Freq: Three times a day (TID) | INTRAMUSCULAR | Status: DC
  Filled 2014-11-12: qty 1

## 2014-11-12 MED ORDER — SODIUM CHLORIDE 0.9 % IV SOLN
INTRAVENOUS | Status: DC
Start: 2014-11-12 — End: 2014-11-12

## 2014-11-12 MED ORDER — NALOXONE HCL 0.4 MG/ML IJ SOLN: 0.4000 mg | Freq: Once | INTRAMUSCULAR | Status: DC

## 2014-11-12 MED ORDER — HYDRALAZINE HCL 20 MG/ML IJ SOLN: 5.0000 mg | INTRAMUSCULAR | Status: DC | PRN

## 2014-11-12 MED ORDER — INSULIN ASPART 100 UNIT/ML ~~LOC~~ SOLN
0.0000 [IU] | Freq: Three times a day (TID) | SUBCUTANEOUS | Status: DC
  Administered 2014-11-13 – 2014-11-15 (×2): 1 [IU] via SUBCUTANEOUS

## 2014-11-12 MED ORDER — PANTOPRAZOLE SODIUM 40 MG IV SOLR
40.0000 mg | INTRAVENOUS | Status: DC
  Filled 2014-11-12 (×2): qty 40

## 2014-11-12 MED ORDER — ASPIRIN 300 MG RE SUPP: 300.0000 mg | Freq: Every day | RECTAL | Status: DC

## 2014-11-12 MED ORDER — SODIUM CHLORIDE 0.9 % IV SOLN
INTRAVENOUS | Status: DC
  Administered 2014-11-13: 03:00:00 via INTRAVENOUS

## 2014-11-12 MED ORDER — ONDANSETRON HCL 4 MG PO TABS
4.0000 mg | ORAL_TABLET | Freq: Four times a day (QID) | ORAL | Status: DC | PRN
  Administered 2014-11-15: 4 mg via ORAL
  Filled 2014-11-12: qty 1

## 2014-11-12 MED ORDER — NALOXONE HCL 1 MG/ML IJ SOLN
2.0000 mg | Freq: Once | INTRAMUSCULAR | Status: AC
  Administered 2014-11-12: 1 mg via INTRAMUSCULAR
  Filled 2014-11-12: qty 2

## 2014-11-12 MED ORDER — NALOXONE HCL 1 MG/ML IJ SOLN
1.0000 mg | Freq: Once | INTRAMUSCULAR | Status: AC
  Administered 2014-11-12: 1 mg via INTRAVENOUS
  Filled 2014-11-12: qty 2

## 2014-11-12 NOTE — ED Provider Notes (Signed)
CSN: [id number]     Arrival date & time 11/12/14  1907 History   First MD Initiated Contact with Patient 11/12/14 1913     Chief Complaint  Patient presents with  . Stroke Symptoms     (Consider location/radiation/quality/duration/timing/severity/associated sxs/prior Treatment) HPI   [name] is a 59 y.o. female who presents for evaluation of altered mental status. The patient was last seen normal about 4 AM, by her daughter. The daughter found her at about 4 PM, altered, and later the and also was called for persistent altered mental status. Patient is unable to give any history. Patient is reportedly visiting her daughter.  Level V caveat-altered mental status   Past Medical History  Diagnosis Date  . Depression   . Thyroid disease   . GERD (gastroesophageal reflux disease)   . Pancreatitis     hx of  . Knee pain, bilateral   . Obesity   . HSV (herpes simplex virus) infection   . Hyperlipemia   . Asthma   . Tachycardia     with sx in 2008  . Breast mass in female     L breast 2008, US showed likely fat necrosis  . Anxiety   . Hypertension     sees Dr. Criss Rosales , [facility] [place]  . Hx of radiation therapy 05/05/12- 07/15/12    right breast, 45 gray x 25 fx, lumpectomy cavity boosted to 16.2 gray  . History of blood transfusion 2011    "after one of my OR's"  . Hypothyroidism   . 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 Polyneuropathy in diabetes(357.2)   . Benign paroxysmal positional vertigo 12/30/2012  . 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  . OSA on CPAP     pt does not know settings  . Pneumonia 1950's  . Anemia   . Type II diabetes mellitus   . History of hiatal hernia   . History of stomach ulcers   . Daily headache   . Migraines     "~ 3 times/month" (01/06/2014)  . Arthritis     "pretty much all my joints"  . Chronic lower back pain   . Schizophrenia   . Kidney stones   . CKD (chronic kidney  disease), stage III     "lower stage" (01/06/2014)   Past Surgical History  Procedure Laterality Date  . Total knee arthroplasty Bilateral 2009-06/2009    left; right  . Foot fracture surgery Right 1990's  . Shoulder open rotator cuff repair Left 1990's  . Thyroidectomy  1970's  . Joint replacement    . Abdominal hysterectomy  1979?    partial  . Cholecystectomy  1980's?  . Colonoscopy with propofol N/A 09/28/2012    Procedure: COLONOSCOPY WITH PROPOFOL;  Surgeon: Lear Ng, MD;  Location: [facility];  Service: Endoscopy;  Laterality: N/A;  . Revision total knee arthroplasty  07/2009  . Breast surgery Right 01/2012    "cancer"  . Breast lumpectomy  03/03/2012    Procedure: LUMPECTOMY;  Surgeon: Haywood Lasso, MD;  Location: [facility] OR;  Service: General;  Laterality: Right;   Family History  Problem Relation Age of Onset  . Heart disease Brother     Multiple MIs, starting in his 38s  . Diabetes Brother   . Hypertension Mother   . Diabetes Mother   . Breast cancer Mother 33  . Bone cancer Mother   . Hypertension Sister   .  Diabetes Sister   . Breast cancer Sister 30  . Breast cancer Maternal Grandmother   . Heart disease Maternal Grandmother   . Uterine cancer Other 19  . Breast cancer Paternal Aunt 15  . Breast cancer Paternal Grandmother     dx in her 32s  . Prostate cancer Paternal Grandfather    Social History  Substance Use Topics  . Smoking status: Former Smoker -- 1.00 packs/day for 7 years    Types: Cigarettes    Quit date: 03/24/1992  . Smokeless tobacco: Never Used  . Alcohol Use: No     Comment: "stopped drinking in 1996; I was an alcoholic"   OB History    No data available     Review of Systems  Unable to perform ROS     Allergies  Shellfish allergy; Bee venom; and Morphine and related  Home Medications   Prior to Admission medications   Medication Sig Start Date End Date Taking? Authorizing Provider  aspirin EC 81 MG tablet Take  1 tablet (81 mg total) by mouth daily. Patient taking differently: Take 81 mg by mouth at bedtime.  08/12/13  Yes Adeline Saralyn Pilar, MD  cholecalciferol (VITAMIN D) 1000 UNITS tablet Take 1 tablet (1,000 Units total) by mouth daily. 11/18/12  Yes Chauncey Cruel, MD  diclofenac sodium (VOLTAREN) 1 % GEL Apply 2 g topically 2 (two) times daily as needed (pain).    Yes Historical Provider, MD  esomeprazole (NEXIUM) 40 MG capsule Take 1 capsule (40 mg total) by mouth daily before breakfast. 10/30/14  Yes Barton Dubois, MD  ferrous sulfate 325 (65 FE) MG EC tablet Take 325 mg by mouth daily with breakfast.   Yes Historical Provider, MD  gabapentin (NEURONTIN) 100 MG capsule Take 100-300 mg by mouth 3 (three) times daily. Takes 165m in am, 1071min pm, and 30016mt bedtime   Yes Historical Provider, MD  insulin aspart (NOVOLOG) 100 UNIT/ML injection Inject 0-6 Units into the skin 3 (three) times daily as needed for high blood sugar (200-299 = 4 units, 300 and above = 6 units). Sliding scale   Yes Historical Provider, MD  insulin detemir (LEVEMIR) 100 UNIT/ML injection Inject 10 Units into the skin at bedtime.   Yes Historical Provider, MD  Iron-Vitamins (GERITOL COMPLETE) TABS Take 1 tablet by mouth daily.   Yes Historical Provider, MD  levothyroxine (SYNTHROID, LEVOTHROID) 150 MCG tablet Take 150 mcg by mouth daily before breakfast.   Yes Historical Provider, MD  LORazepam (ATIVAN) 2 MG tablet Take 1 tablet (2 mg total) by mouth every 12 (twelve) hours as needed for anxiety. 10/30/14  Yes CarBarton DuboisD  meloxicam (MOBIC) 15 MG tablet Take 15 mg by mouth daily.    Yes Historical Provider, MD  NUCYNTA ER 200 MG TB12 Take 200 mg by mouth every 12 (twelve) hours. 09/18/14  Yes Historical Provider, MD  oxyCODONE (ROXICODONE) 15 MG immediate release tablet Take 1 tablet (15 mg total) by mouth every 8 (eight) hours as needed for pain. Patient taking differently: Take 15 mg by mouth 6 (six) times daily.  08/16/14   Yes UluKinnie FeilD  pregabalin (LYRICA) 75 MG capsule Take 2 capsules (150 mg total) by mouth at bedtime. 2 tablets at bedtime Patient taking differently: Take 150 mg by mouth 2 (two) times daily.  08/16/14  Yes UluKinnie FeilD  rosuvastatin (CRESTOR) 20 MG tablet Take 20 mg by mouth daily.   Yes Historical Provider, MD  sitaGLIPtin-metformin (JANUMET) 50-500 MG per tablet Take 1 tablet by mouth 2 (two) times daily with a meal.   Yes Historical Provider, MD  valACYclovir (VALTREX) 1000 MG tablet Take 1,000 mg by mouth 2 (two) times daily.    Yes Historical Provider, MD  Venlafaxine HCl 150 MG TB24 Take 150 mg by mouth daily.   Yes Historical Provider, MD  ziprasidone (GEODON) 20 MG capsule Take 20 mg by mouth every evening.   Yes Historical Provider, MD   BP 159/84 mmHg  Pulse 90  Temp(Src) 98.9 F (37.2 C) (Oral)  Resp 10  Wt 291 lb (131.997 kg)  SpO2 93% Physical Exam  Constitutional: She appears well-developed.  Obese  HENT:  Head: Normocephalic and atraumatic.  Right Ear: External ear normal.  Left Ear: External ear normal.  Eyes: Conjunctivae and EOM are normal. Pupils are equal, round, and reactive to light.  Neck: Normal range of motion and phonation normal. Neck supple.  Cardiovascular: Normal rate, regular rhythm and normal heart sounds.   Pulmonary/Chest: Effort normal. She exhibits no bony tenderness.  Sonorous respirations  Abdominal: Soft. There is no tenderness.  Musculoskeletal: Normal range of motion.  Neurological: No cranial nerve deficit or sensory deficit. She exhibits normal muscle tone. Coordination normal.  Lethargic, arousable, dysarthric. Unable to assess for a aphasia. Patient follows commands and is able to hold her arms elevated, for 5 seconds, without weakness.  Skin: Skin is warm, dry and intact.  Psychiatric:  Lethargic  Nursing note reviewed.   ED Course  Procedures (including critical care time)  Medications  Tapentadol HCl TB12 200  mg (not administered)  0.9 %  sodium chloride infusion (not administered)  pantoprazole (PROTONIX) injection 40 mg (not administered)  levothyroxine (SYNTHROID, LEVOTHROID) injection 75 mcg (not administered)  aspirin suppository 300 mg (not administered)  heparin injection 5,000 Units (not administered)  sodium chloride 0.9 % injection 3 mL (not administered)  ondansetron (ZOFRAN) tablet 4 mg (not administered)    Or  ondansetron (ZOFRAN) injection 4 mg (not administered)  albuterol (PROVENTIL) (2.5 MG/3ML) 0.083% nebulizer solution 2.5 mg (not administered)  insulin aspart (novoLOG) injection 0-9 Units (not administered)  insulin detemir (LEVEMIR) injection 5 Units (not administered)  hydrALAZINE (APRESOLINE) injection 5 mg (not administered)  naloxone (NARCAN) injection 1 mg (1 mg Intravenous Given 11/12/14 1945)  naloxone [facility]) injection 2 mg (1 mg Intramuscular Given 11/12/14 2035)    Patient Vitals for the past 24 hrs:  BP Temp Temp src Pulse Resp SpO2 Weight  11/12/14 2245 159/84 mmHg - - 90 10 93 % -  11/12/14 2215 165/98 mmHg - - 89 13 96 % -  11/12/14 2200 158/81 mmHg - - 91 (!) 0 95 % -  11/12/14 2130 175/84 mmHg - - 88 14 93 % -  11/12/14 2100 (!) 168/101 mmHg - - 92 12 96 % -  11/12/14 2030 147/88 mmHg - - 91 10 96 % -  11/12/14 2016 144/88 mmHg 98.9 F (37.2 C) Oral 92 12 98 % 291 lb (131.997 kg)    8:36 PM Reevaluation with update and discussion. After initial assessment and treatment, an updated evaluation reveals patient has not improved with treatment of Narcan. Findings discussed with family members were in the room with her. They're concerned that she has a right facial droop. At this time. There does not appear to be any facial asymmetry to me. Patient continues to have sonorous respirations. Findings discussed with family members, all questions were answered. WENTZ,ELLIOTT L  CRITICAL CARE Performed by: Richarda Blade Total critical care time: 35  minutes Critical care time was exclusive of separately billable procedures and treating other patients. Critical care was necessary to treat or prevent imminent or life-threatening deterioration. Critical care was time spent personally by me on the following activities: development of treatment plan with patient and/or surrogate as well as nursing, discussions with consultants, evaluation of patient's response to treatment, examination of patient, obtaining history from patient or surrogate, ordering and performing treatments and interventions, ordering and review of laboratory studies, ordering and review of radiographic studies, pulse oximetry and re-evaluation of patient's condition.  8:42 PM-Consult complete with Hospitalist. Patient case explained and discussed. He agrees to admit patient for further evaluation and treatment. Call ended at 2052  23:30- . Repeated blood gas, shows improvement, indicating no need for urgent intubation at this time. Patient is stable for admission to the stepdown unit.  Labs Review Labs Reviewed  CBC - Abnormal; Notable for the following:    WBC 17.5 (*)    Hemoglobin 11.0 (*)    HCT 34.9 (*)    RDW 18.6 (*)    All other components within normal limits  DIFFERENTIAL - Abnormal; Notable for the following:    Neutro Abs 14.0 (*)    All other components within normal limits  COMPREHENSIVE METABOLIC PANEL - Abnormal; Notable for the following:    Glucose, Bld 110 (*)    BUN 26 (*)    Creatinine, Ser 1.41 (*)    Total Bilirubin 0.2 (*)    GFR calc non Af Amer 40 (*)    GFR calc Af Amer 47 (*)    All other components within normal limits  URINE RAPID DRUG SCREEN, HOSP PERFORMED - Abnormal; Notable for the following:    Opiates POSITIVE (*)    Benzodiazepines POSITIVE (*)    All other components within normal limits  URINALYSIS, ROUTINE W REFLEX MICROSCOPIC (NOT AT [facility]) - Abnormal; Notable for the following:    Hgb urine dipstick TRACE (*)    Bilirubin  Urine SMALL (*)    Protein, ur 30 (*)    All other components within normal limits  I-STAT CHEM 8, ED - Abnormal; Notable for the following:    BUN 29 (*)    Creatinine, Ser 1.40 (*)    Glucose, Bld 110 (*)    All other components within normal limits  I-STAT ARTERIAL BLOOD GAS, ED - Abnormal; Notable for the following:    pCO2 arterial 45.1 (*)    pO2, Arterial 61.0 (*)    Bicarbonate 26.0 (*)    All other components within normal limits  I-STAT ARTERIAL BLOOD GAS, ED - Abnormal; Notable for the following:    pO2, Arterial 72.0 (*)    Bicarbonate 26.9 (*)    All other components within normal limits  CULTURE, BLOOD (ROUTINE X 2)  CULTURE, BLOOD (ROUTINE X 2)  ETHANOL  PROTIME-INR  APTT  URINE MICROSCOPIC-ADD ON  AMMONIA  BASIC METABOLIC PANEL  CBC  I-STAT TROPOININ, ED   BUN  Date Value Ref Range Status  11/12/2014 29* 6 - 20 mg/dL Final  11/12/2014 26* 6 - 20 mg/dL Final  10/28/2014 23* 6 - 20 mg/dL Final  10/27/2014 24* 6 - 20 mg/dL Final  06/26/2014 22.8 7.0 - 26.0 mg/dL Final  04/17/2014 22.1 7.0 - 26.0 mg/dL Final  11/15/2013 27* 7-18 mg/dL Final  11/14/2013 32* 7-18 mg/dL Final  11/13/2013 34* 7-18 mg/dL Final  05/12/2013 24.8 7.0 -  26.0 mg/dL Final  11/18/2012 15.5 7.0 - 26.0 mg/dL Final   CREATININE  Date Value Ref Range Status  06/26/2014 1.2* 0.6 - 1.1 mg/dL Final  04/17/2014 1.6* 0.6 - 1.1 mg/dL Final  11/15/2013 1.36* 0.60-1.30 mg/dL Final  11/14/2013 1.43* 0.60-1.30 mg/dL Final  11/13/2013 1.60* 0.60-1.30 mg/dL Final  05/12/2013 1.7* 0.6 - 1.1 mg/dL Final  11/18/2012 0.9 0.6 - 1.1 mg/dL Final   CREATININE, SER  Date Value Ref Range Status  11/12/2014 1.40* 0.44 - 1.00 mg/dL Final  11/12/2014 1.41* 0.44 - 1.00 mg/dL Final  10/28/2014 1.47* 0.44 - 1.00 mg/dL Final  10/27/2014 1.31* 0.44 - 1.00 mg/dL Final    WBC  Date Value Ref Range Status  11/12/2014 17.5* 4.0 - 10.5 K/uL Final  10/28/2014 13.4* 4.0 - 10.5 K/uL Final  10/27/2014 13.3* 4.0  - 10.5 K/uL Final  08/15/2014 8.7 4.0 - 10.5 K/uL Final  06/26/2014 14.5* 3.9 - 10.3 10e3/uL Final  04/17/2014 12.2* 3.9 - 10.3 10e3/uL Final  02/24/2014 10.0 3.9 - 10.3 10e3/uL Final  11/14/2013 7.2 3.6-11.0 x10 3/mm 3 Final  11/13/2013 9.7 3.6-11.0 x10 3/mm 3 Final  06/27/2013 12.4* 3.9 - 10.3 10e3/uL Final     Imaging Review Ct Head Wo Contrast  11/12/2014   CLINICAL DATA:  Altered mental status.  Lethargic.  EXAM: CT HEAD WITHOUT CONTRAST  TECHNIQUE: Contiguous axial images were obtained from the base of the skull through the vertex without intravenous contrast.  COMPARISON:  Head MRI 10/29/2014 and CT 10/28/2014  FINDINGS: A mildly expanded partially empty sella is again noted. There is no evidence of acute cortical infarct, intracranial hemorrhage, mass, midline shift, or extra-axial fluid collection. Ventricles and sulci are normal for age.  Bilateral exophthalmos is unchanged. Cerumen/ debris is noted in both external auditory canals. The mastoid air cells are clear. Small amount of left sphenoid sinus fluid and focal mucosal thickening or mucous retention cyst in the right sphenoid sinus are similar to the prior MRI.  IMPRESSION: No evidence of acute intracranial abnormality.   Electronically Signed   By: Logan Bores M.D.   On: 11/12/2014 20:27   Dg Chest Port 1 View  11/12/2014   CLINICAL DATA:  Acute encephalopathy.  EXAM: PORTABLE CHEST 1 VIEW  COMPARISON:  10/27/2014  FINDINGS: Lung volumes are low. Borderline cardiomegaly. Prominence of the bronchovascular structures, may be related to low lung volumes versus vascular congestion. No confluent airspace disease, large pleural effusion or pneumothorax. Surgical clips project over the thoracic inlet.  IMPRESSION: Hypoventilatory chest. There is borderline cardiomegaly, which may be accentuated by low lung volumes. Similarly, prominence of bronchovascular structures, low lung volumes versus vascular congestion.   Electronically Signed   By:  Jeb Levering M.D.   On: 11/12/2014 22:51   I have personally reviewed and evaluated these images and lab results as part of my medical decision-making.   EKG Interpretation   Date/Time:  Sunday November 12 2014 19:17:27 EDT Ventricular Rate:  101 PR Interval:  150 QRS Duration: 72 QT Interval:  333 QTC Calculation: 432 R Axis:   -36 Text Interpretation:  Sinus tachycardia Abnormal R-wave progression, early  transition Inferior infarct, old since last tracing no significant change  Confirmed by Eulis Foster  MD, ELLIOTT [phone]) on 11/12/2014 7:25:14 PM      MDM   Final diagnoses:  Altered mental status, unspecified altered mental status type    Altered mental status, with lethargy, cause unclear. CT negative for acute stroke. Patient takes sedating medications including  narcotics and benzodiazepine's. Mental status did not change with Narcan. No overt infection. White count is elevated. BUN and creatinine are at baseline. Blood gas indicates mild hypoventilation. This would be consistent with medication toxicity. I doubt that she has hypercapnic respiratory failure. Patient will require admission for further evaluation, observation and intervention.  Nursing Notes Reviewed/ Care Coordinated, and agree without changes. Applicable Imaging Reviewed.  Interpretation of Laboratory Data incorporated into ED treatment  Plan: Admit    Daleen Bo, MD 11/12/14 2337

## 2014-11-12 NOTE — ED Notes (Signed)
Unable to do swallow screen.  Patient will not stay awake to do the screen

## 2014-11-12 NOTE — H&P (Addendum)
[facility] History and Physical  [name] SEG:[id number] DOB: July 21, 1955 DOA: 11/12/2014  Referring physician: ED physician PCP: Tammie Greenland, MD  Specialists:   Chief Complaint: AMS and slurred speech  HPI: [name] is a 59 y.o. female with PMH of HTN, HLD, GERD, depression, [name], medically neuropathy, asthma, TIA, DM2, Hypothyroid, CKD-III, OSA, previous Breast Ca (S/P Lumpectomy and Radiation Rx), who presents with altered mental status and possible slurred speech.  Patient has AMS and is unable to provide medical history, therefore, most of the history is obtained by discussing the case with ED physician and her family. Per her daughter, pt was last seen normal at 4:00 AM. At 6:00 AM, her son called pt and fount that pt was confused because that pt was saying things not making sense on the phone and possibly had slurred speech. Her son did not come to check pt. At 6:00 PM, her daughter came to her home and found that pt was sleeping in the couch with foaming phlem in mouth. Pt was very lethargic and visually hallucinating. Her daughter found many pills on the floor which are either oxycodone or lorazepam (she is not very sure). Patient moves all extremities per her daughter. Patient has oral ulcers which has been going on for 3 mouths. She was seen by dermatologist on 11/10/14, and got culture done in office. No clear diagnosis so far. Daughter states that patient has been taking Valtrex in the past 2 years for possible herpes. Pt also had some black rashes in both arms, which had alredy healed recently. Of note, patient was recently hospitalized from 9/9 to 10/30/14 due to syncope. She had negative workup for stroke including negative MRI of brain. Pt was given two dose of narco 1 mg + 89m in ED without improvement of mental status.  In ED, patient was found to have negative CT head for acute abnormalities. WBC 17.5, positive UDS for opiate and benzo, negative  urinalysis, negative troponin, INR 1.05, PTT 27, temperature normal, no tachycardia, stable renal function, echo level less than 5. ABG showed pH 7.37, PCO2 45.1, PO2 61, pending ammonium level.  Where does patient live?   At home  Can patient participate in ADLs?  Some   Review of Systems: Could not be reviewed due to altered mental status  Allergy:  Allergies  Allergen Reactions  . Shellfish Allergy Shortness Of Breath  . Bee Venom Hives  . Morphine And Related Other (See Comments)    Overly sedated    Past Medical History  Diagnosis Date  . Depression   . Thyroid disease   . GERD (gastroesophageal reflux disease)   . Pancreatitis     hx of  . Knee Perez, bilateral   . Obesity   . HSV (herpes simplex virus) infection   . Hyperlipemia   . Asthma   . Tachycardia     with sx in 2008  . Breast mass in female     L breast 2008, [place] likely fat necrosis  . Anxiety   . Hypertension     se[place] Dr. [place]iss Rosales[place]  . Hx of radiation therapy 05/05/12- 07/15/12    right breast, 45 gray x 25 fx, lumpectomy cavity boosted to 16.2 gray  . History of blood transfusion 2011    "after one of my OR's"  . Hypothyroidism   . 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 Polyneuropathy in diabetes(357.2)   . Benign paroxysmal positional vertigo 12/30/2012  .  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  . OSA on CPAP     pt does not know settings  . Pneumonia 1950's  . Anemia   . Type II diabetes mellitus   . History of hiatal hernia   . History of stomach ulcers   . Daily headache   . Migraines     "~ 3 times/month" (01/06/2014)  . Arthritis     "pretty much all my joints"  . Chronic lower back Perez   . Schizophrenia   . Kidney stones   . CKD (chronic kidney disease), stage III     "lower stage" (01/06/2014)    Past Surgical History  Procedure Laterality Date  . Total knee arthroplasty Bilateral 2009-06/2009    left; right  .  Foot fracture surgery Right 1990's  . Shoulder open rotator cuff repair Left 1990's  . Thyroidectomy  1970's  . Joint replacement    . Abdominal hysterectomy  1979?    partial  . Cholecystectomy  1980's?  . Colonoscopy with propofol N/A 09/28/2012    Procedure: COLONOSCOPY WITH PROPOFOL;  Surgeon: Lear Ng, MD;  Location: [facility];  Service: Endoscopy;  Laterality: N/A;  . Revision total knee arthroplasty  07/2009  . Breast surgery Right 01/2012    "cancer"  . Breast lumpectomy  03/03/2012    Procedure: LUMPECTOMY;  Surgeon: Haywood Lasso, MD;  Location: [facility];  Service: General;  Laterality: Right;    Social History:  reports that she quit smoking about 22 years ago. Her smoking use included Cigarettes. She has a 7 pack-year smoking history. She has never used smokeless tobacco. She reports that she does not drink alcohol or use illicit drugs.  Family History:  Family History  Problem Relation Age of Onset  . Heart disease Brother     Multiple MIs, starting in his 72s  . Diabetes Brother   . Hypertension Mother   . Diabetes Mother   . Breast cancer Mother 49  . Bone cancer Mother   . Hypertension Sister   . Diabetes Sister   . Breast cancer Sister 26  . Breast cancer Maternal Grandmother   . Heart disease Maternal Grandmother   . Uterine cancer Other 19  . Breast cancer Paternal Aunt 21  . Breast cancer Paternal Grandmother     dx in her 54s  . Prostate cancer Paternal Grandfather      Prior to Admission medications   Medication Sig Start Date End Date Taking? Authorizing Zalaya Astarita  aspirin EC 81 MG tablet Take 1 tablet (81 mg total) by mouth daily. Patient taking differently: Take 81 mg by mouth at bedtime.  08/12/13   Sheila Oats, MD  CALCIUM PO Take 1 tablet by mouth daily.     Historical Kathie Posa, MD  cholecalciferol (VITAMIN D) 1000 UNITS tablet Take 1 tablet (1,000 Units total) by mouth daily. 11/18/12   Chauncey Cruel, MD  diclofenac sodium  (VOLTAREN) 1 % GEL Apply 2 g topically 2 (two) times daily as needed (Perez).     Historical Treyson Axel, MD  esomeprazole (NEXIUM) 40 MG capsule Take 1 capsule (40 mg total) by mouth daily before breakfast. 10/30/14   Barton Dubois, MD  ferrous sulfate 325 (65 FE) MG EC tablet Take 325 mg by mouth daily with breakfast.    Historical Nissim Fleischer, MD  gabapentin (NEURONTIN) 100 MG capsule Take 100-300 mg by mouth 3 (three) times daily.  Takes 122m in am, 1063min pm, and 30073mt bedtime    Historical Melo Stauber, MD  insulin aspart (NOVOLOG) 100 UNIT/ML injection Inject 0-6 Units into the skin 3 (three) times daily as needed for high blood sugar (200-299 = 4 units, 300 and above = 6 units). Sliding scale    Historical Denese Mentink, MD  Iron-Vitamins (GERITOL COMPLETE) TABS Take 1 tablet by mouth daily.    Historical Juma Oxley, MD  levothyroxine (SYNTHROID, LEVOTHROID) 150 MCG tablet Take 150 mcg by mouth daily before breakfast.    Historical Nzinga Ferran, MD  lidocaine (XYLOCAINE) 2 % solution Use as directed 10 mLs in the mouth or throat as needed for mouth Perez. 09/12/14   BowDomenic MorasA-C  LORazepam (ATIVAN) 2 MG tablet Take 1 tablet (2 mg total) by mouth every 12 (twelve) hours as needed for anxiety. 10/30/14   CarBarton DuboisD  meloxicam (MOBIC) 15 MG tablet Take 15 mg by mouth daily.     Historical Sukhmani Fetherolf, MD  NUCYNTA ER 200 MG TB12 Take 200 mg by mouth every 12 (twelve) hours. 09/18/14   Historical Joe Tanney, MD  oxyCODONE (ROXICODONE) 15 MG immediate release tablet Take 1 tablet (15 mg total) by mouth every 8 (eight) hours as needed for Perez. Patient taking differently: Take 15 mg by mouth 6 (six) times daily.  08/16/14   UluKinnie FeilD  PARoxetine (PAXIL) 40 MG tablet Take 40 mg by mouth daily. 07/28/14   Historical Sanayah Munro, MD  pregabalin (LYRICA) 75 MG capsule Take 2 capsules (150 mg total) by mouth at bedtime. 2 tablets at bedtime Patient taking differently: Take 150 mg by mouth every evening. 2 tablets at  bedtime 08/16/14   UluKinnie FeilD  rosuvastatin (CRESTOR) 20 MG tablet Take 20 mg by mouth daily.    Historical Torin Whisner, MD  sitaGLIPtin-metformin (JANUMET) 50-500 MG per tablet Take 1 tablet by mouth 2 (two) times daily with a meal.    Historical Melinda Gwinner, MD  valACYclovir (VALTREX) 1000 MG tablet Take 1,000 mg by mouth 2 (two) times daily.     Historical Melaysia Streed, MD    Physical Exam: Filed Vitals:   11/12/14 2130 11/12/14 2200 11/12/14 2215 11/12/14 2245  BP: 175/84 158/81 165/98 159/84  Pulse: 88 91 89 90  Temp:      TempSrc:      Resp: 14 0 13 10  Weight:      SpO2: 93% 95% 96% 93%   General: Not in acute distress HEENT:       Eyes:  pupil equal round reactive to the light, approximately 4 mm in size bilaterally. no scleral icterus.       ENT: No discharge from the ears and nose, no pharynx injection, no tonsillar enlargement.        Neck: No JVD, no bruit, no mass felt. Heme: No neck lymph node enlargement. Cardiac: S1/S2, RRR, No murmurs, No gallops or rubs. Pulm: has rhonchi bilaterally. No rales, wheezing, rubs. Abd: Soft, nondistended, no organomegaly, BS present. Ext: No pitting leg edema bilaterally. 2+DP/PT pulse bilaterally. Musculoskeletal: No joint deformities, No joint redness or warmth, no limitation of ROM in spin. Skin: No rashes.  Neuro: deeply sleeping, not oriented X3, cranial nerves II-XII grossly intact, moves all extremities. Brachial reflex 2+ bilaterally. Knee reflex could not be introduced. Negative Babinski's sign.  Psych: Patient is not psychotic, no suicidal or hemocidal ideation.  Labs on Admission:  Basic Metabolic Panel:  Recent Labs Lab 11/12/14 1935 11/12/14 1940  NA  137 138  K 4.4 4.3  CL 104 103  CO2 22  --   GLUCOSE 110* 110*  BUN 26* 29*  CREATININE 1.41* 1.40*  CALCIUM 9.4  --    Liver Function Tests:  Recent Labs Lab 11/12/14 1935  AST 19  ALT 21  ALKPHOS 76  BILITOT 0.2*  PROT 6.7  ALBUMIN 3.6   No results  for input(s): LIPASE, AMYLASE in the last 168 hours. No results for input(s): AMMONIA in the last 168 hours. CBC:  Recent Labs Lab 11/12/14 1935 11/12/14 1940  WBC 17.5*  --   NEUTROABS 14.0*  --   HGB 11.0* 12.9  HCT 34.9* 38.0  MCV 87.5  --   PLT 297  --    Cardiac Enzymes: No results for input(s): CKTOTAL, CKMB, CKMBINDEX, TROPONINI in the last 168 hours.  BNP (last 3 results) No results for input(s): BNP in the last 8760 hours.  ProBNP (last 3 results)  Recent Labs  01/06/14 1010  PROBNP 14.5    CBG: No results for input(s): GLUCAP in the last 168 hours.  Radiological Exams on Admission: Ct Head Wo Contrast  11/12/2014   CLINICAL DATA:  Altered mental status.  Lethargic.  EXAM: CT HEAD WITHOUT CONTRAST  TECHNIQUE: Contiguous axial images were obtained from the base of the skull through the vertex without intravenous contrast.  COMPARISON:  Head MRI 10/29/2014 and CT 10/28/2014  FINDINGS: A mildly expanded partially empty sella is again noted. There is no evidence of acute cortical infarct, intracranial hemorrhage, mass, midline shift, or extra-axial fluid collection. Ventricles and sulci are normal for age.  Bilateral exophthalmos is unchanged. Cerumen/ debris is noted in both external auditory canals. The mastoid air cells are clear. Small amount of left sphenoid sinus fluid and focal mucosal thickening or mucous retention cyst in the right sphenoid sinus are similar to the prior MRI.  IMPRESSION: No evidence of acute intracranial abnormality.   Electronically Signed   By: Logan Bores M.D.   On: 11/12/2014 20:27   Dg Chest Port 1 View  11/12/2014   CLINICAL DATA:  Acute encephalopathy.  EXAM: PORTABLE CHEST 1 VIEW  COMPARISON:  10/27/2014  FINDINGS: Lung volumes are low. Borderline cardiomegaly. Prominence of the bronchovascular structures, may be related to low lung volumes versus vascular congestion. No confluent airspace disease, large pleural effusion or pneumothorax.  Surgical clips project over the thoracic inlet.  IMPRESSION: Hypoventilatory chest. There is borderline cardiomegaly, which may be accentuated by low lung volumes. Similarly, prominence of bronchovascular structures, low lung volumes versus vascular congestion.   Electronically Signed   By: Jeb Levering M.D.   On: 11/12/2014 22:51    EKG: Independently reviewed.  Abnormal findings: Early R-wave progression, T-wave flattening in the 1/aVL, Q waves only in lead 3  Assessment/Plan Principal Problem:   Acute encephalopathy Active Problems:   Hypothyroidism   HLD (hyperlipidemia)   Depression   Essential hypertension   Asthma   GERD   KNEE Perez   Secondary parkinsonism   Dysarthria   Recurrent falls   Type II diabetes mellitus   OSA on CPAP   Chronic lower back Perez   Diabetic polyneuropathy   TIA (transient ischemic attack)   Leukocytosis   Altered mental status   CKD (chronic kidney disease), stage III   Slurred speech  Addendum at 1:42 AM: RN called and reported that pt woke up and is oriented x 3, talking to family normally. MRI showed no acute intracranial infarct  or other process identified, but showed stable bilateral exophthalmos.   -Will resume oral meds except for Lyric, oxycodone, Ativan, venlafaxine, Neurontin   Acute encephalopathy: Etiology is unclear. CT head is negative for acute abnormalities. It is most likely due to drug overdose, possibly lorazepam versus oxycodone. 2 doses of Narcan were given emergency room without significant improvement of her mental status, indicating possibly not due to narcotic overdose. Patient is also on Neurontin and geodon which are also possibly overdosed. Initial ABG showed  pH 7.37, PCO2 45.1, PO2 61. Another potential differential diagnosis is TIA or stroke given possible slurred speech.  -Will admit to SDU -will repeat ABG in one hour -monitor respiratory function closely and deep suction -hold oral meds now, including  Neurontin and Geodon. -give ASA by rectal. -MRI-brain  Leukocytosis: No fever, likely due to stress-induced margination. Urinalysis negative. -will follow up by cbc -blood culture if develops fever -CXR portable  Hypothyroidism: Last TSH was 1.549 on 10/28/14 -Switched to IV synthroid and cut dose by half from 150-->75 Mcg daily  HLD: Last LDL was 58 on 03/23/09 -Hold oral home medications: Crestor  Depression and schizophrenia -hold oral med: Venlafaxine and geodon  HTN: Not on medications at home. Blood pressure 140/88 -Hydralazine when necessary  Asthma: no signs of acute exacerbation -prn albuterol nebs  GERD: -Protonix IV  -Pepcid IV -will switch PPI to pepcid IV until C diff pcr negative  DM-II: Last A1c 7.5 on 10/28/14, not well controled. Patient is taking Levemir, NovoLog and Janumet at home -will decrease Levemir dose from 10 units to 5 units daily  -SSI  Hx of TIA (transient ischemic attack): -ASA per rectal now  CKD (chronic kidney disease), stage III: stable. Baseline creatinine 1.2-1.4. Her creatinine is 1.40, BUN 29, which is at baseline. -Follow-up renal function by BMP   DVT ppx: SQ Heparin   Code Status: Full code Family Communication:  Yes, patient's  daughter and son  at bed side Disposition Plan: Admit to inpatient   Date of Service 11/12/2014    Ivor Costa [facility] Pager [phone]  If 7PM-7AM, please contact night-coverage [email or web address] Password [facility] 11/12/2014, 11:07 PM

## 2014-11-13 ENCOUNTER — Inpatient Hospital Stay (HOSPITAL_COMMUNITY): Payer: Medicare Other

## 2014-11-13 DIAGNOSIS — G9340 Encephalopathy, unspecified: Secondary | ICD-10-CM | POA: Diagnosis not present

## 2014-11-13 DIAGNOSIS — E039 Hypothyroidism, unspecified: Secondary | ICD-10-CM | POA: Diagnosis not present

## 2014-11-13 DIAGNOSIS — K219 Gastro-esophageal reflux disease without esophagitis: Secondary | ICD-10-CM | POA: Diagnosis not present

## 2014-11-13 DIAGNOSIS — M545 Low back pain: Secondary | ICD-10-CM | POA: Diagnosis not present

## 2014-11-13 DIAGNOSIS — R4182 Altered mental status, unspecified: Secondary | ICD-10-CM | POA: Diagnosis not present

## 2014-11-13 DIAGNOSIS — N183 Chronic kidney disease, stage 3 (moderate): Secondary | ICD-10-CM

## 2014-11-13 DIAGNOSIS — F4310 Post-traumatic stress disorder, unspecified: Secondary | ICD-10-CM | POA: Diagnosis not present

## 2014-11-13 DIAGNOSIS — E118 Type 2 diabetes mellitus with unspecified complications: Secondary | ICD-10-CM

## 2014-11-13 DIAGNOSIS — E0842 Diabetes mellitus due to underlying condition with diabetic polyneuropathy: Secondary | ICD-10-CM | POA: Diagnosis not present

## 2014-11-13 LAB — CBC
HCT: 28.6 % — ABNORMAL LOW (ref 36.0–46.0)
Hemoglobin: 9.1 g/dL — ABNORMAL LOW (ref 12.0–15.0)
MCH: 27.5 pg (ref 26.0–34.0)
MCHC: 31.8 g/dL (ref 30.0–36.0)
MCV: 86.4 fL (ref 78.0–100.0)
PLATELETS: 237 10*3/uL (ref 150–400)
RBC: 3.31 MIL/uL — AB (ref 3.87–5.11)
RDW: 18.6 % — ABNORMAL HIGH (ref 11.5–15.5)
WBC: 13.5 10*3/uL — ABNORMAL HIGH (ref 4.0–10.5)

## 2014-11-13 LAB — BASIC METABOLIC PANEL
Anion gap: 7 (ref 5–15)
BUN: 20 mg/dL (ref 6–20)
CALCIUM: 8.9 mg/dL (ref 8.9–10.3)
CO2: 26 mmol/L (ref 22–32)
CREATININE: 1.06 mg/dL — AB (ref 0.44–1.00)
Chloride: 107 mmol/L (ref 101–111)
GFR calc non Af Amer: 57 mL/min — ABNORMAL LOW (ref >60)
GLUCOSE: 108 mg/dL — AB (ref 65–99)
Potassium: 3.9 mmol/L (ref 3.5–5.1)
Sodium: 140 mmol/L (ref 135–145)

## 2014-11-13 LAB — GLUCOSE, CAPILLARY
GLUCOSE-CAPILLARY: 101 mg/dL — AB (ref 65–99)
Glucose-Capillary: 94 mg/dL (ref 65–99)
Glucose-Capillary: 124 mg/dL — ABNORMAL HIGH (ref 65–99)
Glucose-Capillary: 126 mg/dL — ABNORMAL HIGH (ref 65–99)
Glucose-Capillary: 94 mg/dL (ref 65–99)

## 2014-11-13 LAB — MRSA PCR SCREENING: MRSA BY PCR: NEGATIVE

## 2014-11-13 MED ORDER — ACETAMINOPHEN 325 MG PO TABS
650.0000 mg | ORAL_TABLET | Freq: Four times a day (QID) | ORAL | Status: DC | PRN
  Administered 2014-11-14 – 2014-11-15 (×2): 650 mg via ORAL
  Filled 2014-11-13 (×3): qty 2

## 2014-11-13 MED ORDER — DICLOFENAC SODIUM 1 % TD GEL: 2.0000 g | Freq: Two times a day (BID) | TRANSDERMAL | Status: DC | PRN

## 2014-11-13 MED ORDER — ESOMEPRAZOLE MAGNESIUM 40 MG PO CPDR
40.0000 mg | DELAYED_RELEASE_CAPSULE | Freq: Every day | ORAL | Status: DC
  Administered 2014-11-14 – 2014-11-15 (×2): 40 mg via ORAL
  Filled 2014-11-13 (×3): qty 1

## 2014-11-13 MED ORDER — VALACYCLOVIR HCL 500 MG PO TABS
1000.0000 mg | ORAL_TABLET | Freq: Two times a day (BID) | ORAL | Status: DC
  Administered 2014-11-13 – 2014-11-15 (×6): 1000 mg via ORAL
  Filled 2014-11-13 (×7): qty 2

## 2014-11-13 MED ORDER — ZIPRASIDONE HCL 20 MG PO CAPS
20.0000 mg | ORAL_CAPSULE | Freq: Every evening | ORAL | Status: DC
  Administered 2014-11-13 – 2014-11-14 (×3): 20 mg via ORAL
  Filled 2014-11-13 (×4): qty 1

## 2014-11-13 MED ORDER — ASPIRIN EC 81 MG PO TBEC
81.0000 mg | DELAYED_RELEASE_TABLET | Freq: Every day | ORAL | Status: DC
  Administered 2014-11-13 – 2014-11-14 (×3): 81 mg via ORAL
  Filled 2014-11-13 (×3): qty 1

## 2014-11-13 MED ORDER — FERROUS SULFATE 325 (65 FE) MG PO TABS
325.0000 mg | ORAL_TABLET | Freq: Every day | ORAL | Status: DC
  Administered 2014-11-13 – 2014-11-15 (×3): 325 mg via ORAL
  Filled 2014-11-13 (×4): qty 1

## 2014-11-13 MED ORDER — ROSUVASTATIN CALCIUM 20 MG PO TABS
20.0000 mg | ORAL_TABLET | Freq: Every day | ORAL | Status: DC
  Administered 2014-11-13 – 2014-11-15 (×3): 20 mg via ORAL
  Filled 2014-11-13 (×3): qty 1

## 2014-11-13 MED ORDER — MELOXICAM 15 MG PO TABS
15.0000 mg | ORAL_TABLET | Freq: Every day | ORAL | Status: DC
  Administered 2014-11-13: 15 mg via ORAL
  Filled 2014-11-13: qty 1

## 2014-11-13 MED ORDER — HEPARIN SODIUM (PORCINE) 5000 UNIT/ML IJ SOLN
5000.0000 [IU] | Freq: Three times a day (TID) | INTRAMUSCULAR | Status: DC
  Administered 2014-11-13 – 2014-11-15 (×8): 5000 [IU] via SUBCUTANEOUS
  Filled 2014-11-13 (×8): qty 1

## 2014-11-13 MED ORDER — LEVOTHYROXINE SODIUM 100 MCG PO TABS
150.0000 ug | ORAL_TABLET | Freq: Every day | ORAL | Status: DC
  Administered 2014-11-13 – 2014-11-15 (×3): 150 ug via ORAL
  Filled 2014-11-13 (×6): qty 1

## 2014-11-13 MED ORDER — VITAMIN D 1000 UNITS PO TABS
1000.0000 [IU] | ORAL_TABLET | Freq: Every day | ORAL | Status: DC
  Administered 2014-11-13 – 2014-11-15 (×3): 1000 [IU] via ORAL
  Filled 2014-11-13 (×3): qty 1

## 2014-11-13 MED ORDER — ADULT MULTIVITAMIN W/MINERALS CH
1.0000 | ORAL_TABLET | Freq: Every day | ORAL | Status: DC
  Administered 2014-11-13 – 2014-11-15 (×3): 1 via ORAL
  Filled 2014-11-13 (×3): qty 1

## 2014-11-13 NOTE — Progress Notes (Signed)
EEG completed, results pending

## 2014-11-13 NOTE — Progress Notes (Addendum)
PATIENT DETAILS Name: [name] Age: 59 y.o. Sex: female Date of Birth: 1955-02-23 Admit Date: 11/12/2014 Admitting Physician Ivor Costa, MD SLP:[id number],MYTRZ N, MD  Subjective: Improving-sleepy this morning but answering questions appropriately.  Assessment/Plan: Principal Problem: Acute encephalopathy: Suspect toxic encephalopathy from polypharmacy. MRI brain negative. Continue to hold narcotics, benzodiazepine's. Follow  Active Problems: Hypothyroidism: Continue with Synthroid  HLD (hyperlipidemia): Continue statin  Depression/schizophrenia: Continue Geodon and venlafaxine  Essential hypertension: Controlled, not on any antihypertensives as outpatient-follow-if blood pressure persistently elevated, will start antihypertensives over the next few days.  Type 2 diabetes: CBGs stable I think continue with Levemir 5 units, and SSI. Oral hypoglycemics will be resumed on discharge  CKD stage III: Creatinine closely usual baseline. Follow.  Chronic back pain/diabetic polyneuropathy: Hold narcotics for now.  Asthma: No signs of exacerbation-lungs clear-continue with as needed albuterol.  OSA: Continue CPAP  GERD: Continue PPI  Disposition: Remain inpatient-transfer to Telemetry  Antimicrobial agents  See below  Anti-infectives    Start     Dose/Rate Route Frequency Ordered Stop   11/13/14 0145  valACYclovir (VALTREX) tablet 1,000 mg     1,000 mg Oral 2 times daily 11/13/14 0139        DVT Prophylaxis: Prophylactic Heparin   Code Status: Full code   Family Communication None at bedside  Procedures: None  CONSULTS:  None  Time spent 30 minutes-Greater than 50% of this time was spent in counseling, explanation of diagnosis, planning of further management, and coordination of care.  MEDICATIONS: Scheduled Meds: . aspirin EC  81 mg Oral QHS  . cholecalciferol  1,000 Units Oral Daily  . ferrous sulfate  325 mg Oral Q breakfast  .  heparin subcutaneous  5,000 Units Subcutaneous 3 times per day  . insulin aspart  0-9 Units Subcutaneous TID WC  . insulin detemir  5 Units Subcutaneous QHS  . levothyroxine  150 mcg Oral QAC breakfast  . meloxicam  15 mg Oral Daily  . multivitamin with minerals  1 tablet Oral Daily  . pantoprazole (PROTONIX) IV  40 mg Intravenous Q24H  . rosuvastatin  20 mg Oral Daily  . sodium chloride  3 mL Intravenous Q12H  . valACYclovir  1,000 mg Oral BID  . ziprasidone  20 mg Oral QPM   Continuous Infusions:  PRN Meds:.acetaminophen, albuterol, diclofenac sodium, hydrALAZINE, ondansetron **OR** ondansetron (ZOFRAN) IV    PHYSICAL EXAM: Vital signs in last 24 hours: Filed Vitals:   11/13/14 0730 11/13/14 0732 11/13/14 1223 11/13/14 1300  BP: 143/80  152/82   Pulse: 81 84 84   Temp:    98.3 F (36.8 C)  TempSrc:    Oral  Resp: _0 Height:      Weight:      SpO2: 94% 96% 99%     Weight change:  Filed Weights   11/12/14 2016 11/13/14 0042  Weight: 131.997 kg (291 lb) 139 kg (306 lb 7 oz)   Body mass index is 50.99 kg/(m^2).   Gen Exam: Awake-but still somewhat lethargic-but nonetheless alert and follows all my commands.  Neck: Supple, No JVD.   Chest: B/L Clear.   CVS: S1 S2 Regular, no murmurs.  Abdomen: soft, BS +, non tender, non distended.  Extremities: no edema, lower extremities warm to touch. Neurologic: Non Focal.   Skin: No Rash.   Wounds: N/A.    Intake/Output from previous day:  Intake/Output Summary (Last 24 hours) at 11/13/14 1432 Last data filed at 11/13/14 1000  Gross per 24 hour  Intake 659.25 ml  Output    350 ml  Net 309.25 ml     LAB RESULTS: CBC  Recent Labs Lab 11/12/14 1935 11/12/14 1940 11/13/14 0408  WBC 17.5*  --  13.5*  HGB 11.0* 12.9 9.1*  HCT 34.9* 38.0 28.6*  PLT 297  --  237  MCV 87.5  --  86.4  MCH 27.6  --  27.5  MCHC 31.5  --  31.8  RDW 18.6*  --  18.6*  LYMPHSABS 2.5  --   --   MONOABS 0.7  --   --   EOSABS 0.2   --   --   BASOSABS 0.0  --   --     Chemistries   Recent Labs Lab 11/12/14 1935 11/12/14 1940 11/13/14 0408  NA 137 138 140  K 4.4 4.3 3.9  CL 104 103 107  CO2 22  --  26  GLUCOSE 110* 110* 108*  BUN 26* 29* 20  CREATININE 1.41* 1.40* 1.06*  CALCIUM 9.4  --  8.9    CBG:  Recent Labs Lab 11/13/14 0109 11/13/14 0730  GLUCAP 101* 94    GFR Estimated Creatinine Clearance: 82 mL/min (by C-G formula based on Cr of 1.06).  Coagulation profile  Recent Labs Lab 11/12/14 1935  INR 1.05    Cardiac Enzymes No results for input(s): CKMB, TROPONINI, MYOGLOBIN in the last 168 hours.  Invalid input(s): CK  Invalid input(s): POCBNP No results for input(s): DDIMER in the last 72 hours. No results for input(s): HGBA1C in the last 72 hours. No results for input(s): CHOL, HDL, LDLCALC, TRIG, CHOLHDL, LDLDIRECT in the last 72 hours. No results for input(s): TSH, T4TOTAL, T3FREE, THYROIDAB in the last 72 hours.  Invalid input(s): FREET3 No results for input(s): VITAMINB12, FOLATE, FERRITIN, TIBC, IRON, RETICCTPCT in the last 72 hours. No results for input(s): LIPASE, AMYLASE in the last 72 hours.  Urine Studies No results for input(s): UHGB, CRYS in the last 72 hours.  Invalid input(s): UACOL, UAPR, USPG, UPH, UTP, UGL, UKET, UBIL, UNIT, UROB, ULEU, UEPI, UWBC, URBC, UBAC, CAST, UCOM, BILUA  MICROBIOLOGY: Recent Results (from the past 240 hour(s))  Culture, blood (routine x 2)     Status: None (Preliminary result)   Collection Time: 11/12/14 10:25 PM  Result Value Ref Range Status   Specimen Description BLOOD LEFT HAND  Final   Special Requests BOTTLES DRAWN AEROBIC ONLY 3CC  Final   Culture NO GROWTH < 24 HOURS  Final   Report Status PENDING  Incomplete  MRSA PCR Screening     Status: None   Collection Time: 11/13/14  3:52 AM  Result Value Ref Range Status   MRSA by PCR NEGATIVE NEGATIVE Final    Comment:        The GeneXpert MRSA Assay (FDA approved for NASAL  specimens only), is one component of a comprehensive MRSA colonization surveillance program. It is not intended to diagnose MRSA infection nor to guide or monitor treatment for MRSA infections.     RADIOLOGY STUDIES/RESULTS: Ct Head Wo Contrast  11/12/2014   CLINICAL DATA:  Altered mental status.  Lethargic.  EXAM: CT HEAD WITHOUT CONTRAST  TECHNIQUE: Contiguous axial images were obtained from the base of the skull through the vertex without intravenous contrast.  COMPARISON:  Head MRI 10/29/2014 and CT 10/28/2014  FINDINGS: A mildly expanded partially empty sella  is again noted. There is no evidence of acute cortical infarct, intracranial hemorrhage, mass, midline shift, or extra-axial fluid collection. Ventricles and sulci are normal for age.  Bilateral exophthalmos is unchanged. Cerumen/ debris is noted in both external auditory canals. The mastoid air cells are clear. Small amount of left sphenoid sinus fluid and focal mucosal thickening or mucous retention cyst in the right sphenoid sinus are similar to the prior MRI.  IMPRESSION: No evidence of acute intracranial abnormality.   Electronically Signed   By: Logan Bores M.D.   On: 11/12/2014 20:27   Ct Head Wo Contrast  10/28/2014   CLINICAL DATA:  Syncope  EXAM: CT HEAD WITHOUT CONTRAST  TECHNIQUE: Contiguous axial images were obtained from the base of the skull through the vertex without intravenous contrast.  COMPARISON:  04/22/2012  FINDINGS: No mass effect, midline shift, or acute hemorrhage. Sella turcica remain somewhat expanded and fluid-filled. Cerumen fills the external auditory canals. No fracture. Mastoid air cells are clear. Visualized paranasal sinuses are clear. Intact cranium.  IMPRESSION: No acute intracranial pathology.   Electronically Signed   By: Marybelle Killings M.D.   On: 10/28/2014 09:20   Mr Brain Wo Contrast  11/13/2014   CLINICAL DATA:  Initial evaluation for acute slurred speech.  EXAM: MRI HEAD WITHOUT CONTRAST   TECHNIQUE: Multiplanar, multiecho pulse sequences of the brain and surrounding structures were obtained without intravenous contrast.  COMPARISON:  Prior CT from earlier the same day as well as previous MRI from 10/29/2014.  FINDINGS: The CSF containing spaces are within normal limits for patient age. No focal parenchymal signal abnormality is identified. No mass lesion, midline shift, or extra-axial fluid collection. Ventricles are normal in size without evidence of hydrocephalus.  No diffusion-weighted signal abnormality is identified to suggest acute intracranial infarct. Gray-white matter differentiation is maintained. Normal flow voids are seen within the intracranial vasculature. No intracranial hemorrhage identified.  The cervicomedullary junction is normal. Incidental note made of an empty sella. Bilateral exophthalmos again noted. No acute abnormality about the globes and orbits.  The bone marrow signal intensity is normal. Calvarium is intact. Visualized upper cervical spine is within normal limits.  Scalp soft tissues are unremarkable.  Small amount layering opacity within the left sphenoid sinus. Paranasal sinuses are otherwise clear. Small left mastoid effusion.  IMPRESSION: 1. No acute intracranial infarct or other process identified. 2. Stable bilateral exophthalmos. No retrobulbar process. Findings could be related to underlying thyroid disease. Correlation with laboratory values recommended. 3. Otherwise normal brain MRI.   Electronically Signed   By: Jeannine Boga M.D.   On: 11/13/2014 00:45   Mr Brain Wo Contrast  10/29/2014   CLINICAL DATA:  TIA. Episodes of dizziness lightheadedness or last 2 weeks. Right-sided headaches 2 weeks. Syncopal episode.  EXAM: MRI HEAD WITHOUT CONTRAST  TECHNIQUE: Multiplanar, multiecho pulse sequences of the brain and surrounding structures were obtained without intravenous contrast.  COMPARISON:  CT head without contrast 10/28/2014. MRI brain 06/05/2011.   FINDINGS: A relatively empty sella is again noted. No acute infarct, hemorrhage, or mass lesion is present. The ventricles are of normal size. Insert pass fluid  The basal ganglia and brainstem are within normal limits. Flow is present in the major intracranial arteries. Exophthalmos is again seen bilaterally. There is straightening of the optic nerves. No mass lesion is present. Minimal mucosal thickening is present in the left maxillary sinus, left sphenoid sinus, and anterior left ethmoid air cells. There is some fluid in left mastoid air  cells as well. No obstructing nasopharyngeal lesion is present.  Midline structures are unremarkable.  IMPRESSION: 1. No acute or focal lesion to explain the patient's symptoms. 2. Normal MRI appearance brain. 3. Bilateral exophthalmos is again noted. No focal retrobulbar mass lesion is present. This could be related to thyroid disease. Laboratory results suggests the patient is euthyroid.   Electronically Signed   By: San Morelle M.D.   On: 10/29/2014 10:55   Dg Chest Port 1 View  11/12/2014   CLINICAL DATA:  Acute encephalopathy.  EXAM: PORTABLE CHEST 1 VIEW  COMPARISON:  10/27/2014  FINDINGS: Lung volumes are low. Borderline cardiomegaly. Prominence of the bronchovascular structures, may be related to low lung volumes versus vascular congestion. No confluent airspace disease, large pleural effusion or pneumothorax. Surgical clips project over the thoracic inlet.  IMPRESSION: Hypoventilatory chest. There is borderline cardiomegaly, which may be accentuated by low lung volumes. Similarly, prominence of bronchovascular structures, low lung volumes versus vascular congestion.   Electronically Signed   By: Jeb Levering M.D.   On: 11/12/2014 22:51   Dg Chest Port 1 View  10/27/2014   CLINICAL DATA:  Shortness of breath and chest pain.  EXAM: PORTABLE CHEST - 1 VIEW  COMPARISON:  08/14/2014  FINDINGS: The heart size and mediastinal contours are within normal  limits. Both lungs are clear. The visualized skeletal structures are unremarkable.  IMPRESSION: No active disease.   Electronically Signed   By: Kerby Moors M.D.   On: 10/27/2014 23:17    Oren Binet, MD  [facility] Pager:[phone]  If 7PM-7AM, please contact night-coverage [email or web address] Password [email or web address] 11/13/2014, 2:32 PM   LOS: 1 day

## 2014-11-13 NOTE — Progress Notes (Signed)
Pt arrived to 5M19 from 3S transported by wheelchair. Pt ambulated to bed with assist. Pt alert and oriented x 4. Vitals taken and stable. Pt placed on telemetry box #18. Pt complains of pain in back with ambulation and movement but pain decreased once patient was settled in bed. RN with continue to monitor.

## 2014-11-13 NOTE — Progress Notes (Signed)
OT Cancellation Note  Patient Details Name: [name] MRN: [id number] DOB: 14-Feb-1956   Cancelled Treatment:    Reason Eval/Treat Not Completed: Patient not medically ready Pt with active bedrest orders. Please update activity orders when appropriate for therapy. [place], OTR/L  [phone] 11/13/2014 11/13/2014, 7:43 AM

## 2014-11-13 NOTE — Evaluation (Signed)
Clinical/Bedside Swallow Evaluation Patient Details  Name: [name] MRN: [id number] Date of Birth: 13-Nov-1955  Today's Date: 11/13/2014 Time: SLP Start Time (ACUTE ONLY): 1110 SLP Stop Time (ACUTE ONLY): 1123 SLP Time Calculation (min) (ACUTE ONLY): 13 min  Past Medical History:  Past Medical History  Diagnosis Date  . Depression   . Thyroid disease   . GERD (gastroesophageal reflux disease)   . Pancreatitis     hx of  . Knee pain, bilateral   . Obesity   . HSV (herpes simplex virus) infection   . Hyperlipemia   . Asthma   . Tachycardia     with sx in 2008  . Breast mass in female     L breast 2008, US showed likely fat necrosis  . Anxiety   . Hypertension     sees Dr. Criss Rosales , [facility]  . Hx of radiation therapy 05/05/12- 07/15/12    right breast, 45 gray x 25 fx, lumpectomy cavity boosted to 16.2 gray  . History of blood transfusion 2011    "after one of my OR's"  . Hypothyroidism   . 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 Polyneuropathy in diabetes(357.2)   . Benign paroxysmal positional vertigo 12/30/2012  . 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  . OSA on CPAP     pt does not know settings  . Pneumonia 1950's  . Anemia   . Type II diabetes mellitus   . History of hiatal hernia   . History of stomach ulcers   . Daily headache   . Migraines     "~ 3 times/month" (01/06/2014)  . Arthritis     "pretty much all my joints"  . Chronic lower back pain   . Schizophrenia   . Kidney stones   . CKD (chronic kidney disease), stage III     "lower stage" (01/06/2014)   Past Surgical History:  Past Surgical History  Procedure Laterality Date  . Total knee arthroplasty Bilateral 2009-06/2009    left; right  . Foot fracture surgery Right 1990's  . Shoulder open rotator cuff repair Left 1990's  . Thyroidectomy  1970's  . Joint replacement    . Abdominal hysterectomy  1979?    partial  . Cholecystectomy   1980's?  . Colonoscopy with propofol N/A 09/28/2012    Procedure: COLONOSCOPY WITH PROPOFOL;  Surgeon: Lear Ng, MD;  Location: [facility];  Service: Endoscopy;  Laterality: N/A;  . Revision total knee arthroplasty  07/2009  . Breast surgery Right 01/2012    "cancer"  . Breast lumpectomy  03/03/2012    Procedure: LUMPECTOMY;  Surgeon: Haywood Lasso, MD;  Location: [facility];  Service: General;  Laterality: Right;   HPI:  58 y.o. female with PMH of HTN, HLD, GERD, depression, back pain, medically neuropathy, asthma, TIA, DM2, Hypothyroid, CKD-III, OSA, previous Breast CA, who presents with altered mental status and possible slurred speech.  CT head and MRI were negative for acute abnormalities, per MD report AMS may due to drug overdose, AMS was not improved with 2 doses of Narcan. Swallow screen discontinued due to pt's lethargy.    Assessment / Plan / Recommendation Clinical Impression  Pt demonstrates adequate swallow function when alert. Cues needed to sustain arousal for assessment. Recommend pt continue a regular diet and thin liquids when alert and able to self feed. No SLP f/u needed, will sign off.     Aspiration Risk  Moderate    Diet Recommendation Age appropriate regular solids;Thin   Medication Administration: Whole meds with liquid    Other  Recommendations Oral Care Recommendations: Oral care BID   Follow Up Recommendations       Frequency and Duration        Pertinent Vitals/Pain NA    SLP Swallow Goals     Swallow Study Prior Functional Status       General Other Pertinent Information: 59 y.o. female with PMH of HTN, HLD, GERD, depression, back pain, medically neuropathy, asthma, TIA, DM2, Hypothyroid, CKD-III, OSA, previous Breast CA, who presents with altered mental status and possible slurred speech.  CT head and MRI were negative for acute abnormalities, per MD report AMS may due to drug overdose, AMS was not improved with 2 doses of Narcan.  Swallow screen discontinued due to pt's lethargy.  Type of Study: Bedside swallow evaluation Diet Prior to this Study: Regular;Thin liquids Temperature Spikes Noted: No Respiratory Status: Room air History of Recent Intubation: No Behavior/Cognition: Lethargic/Drowsy Oral Cavity - Dentition: Adequate natural dentition/normal for age Self-Feeding Abilities: Needs assist Patient Positioning: Upright in bed Baseline Vocal Quality: Normal Volitional Cough: Strong Volitional Swallow: Able to elicit    Oral/Motor/Sensory Function Overall Oral Motor/Sensory Function: Appears within functional limits for tasks assessed   Ice Chips     Thin Liquid Thin Liquid: Within functional limits Presentation: Cup;Straw    Nectar Thick Nectar Thick Liquid: Not tested   Honey Thick Honey Thick Liquid: Not tested   Puree Puree: Within functional limits   Solid   GO    Solid: Within functional limits      [facility], [place] CCC-SLP [phone]  DeBlois, Katherene Ponto 11/13/2014,11:29 AM

## 2014-11-13 NOTE — ED Notes (Signed)
Patient taken from MRI to 3S

## 2014-11-13 NOTE — Evaluation (Signed)
Physical Therapy Evaluation Patient Details Name: [name] MRN: [id number] DOB: 20-Jan-1956 Today's Date: 11/13/2014   History of Present Illness  59 y.o. female with PMH of HTN, HLD, GERD, depression, chronic low back pain, medically neuropathy, asthma, TIA, DM2, Hypothyroid, CKD-III, OSA, previous Breast Ca (S/P Lumpectomy and Radiation Rx), who presents with altered mental status and possible slurred speech. Pt was very lethargic and visually hallucinating. Patient was recently hospitalized from 9/9 to 10/30/14 due to syncope.   Clinical Impression  Patient in bed, agreeable to participate in PT today. Patient was able to transfer as described below. Patient is in a great amount of distress due to her increased pain - she reports this is much higher than it has been in recent times. Patient will benefit from continued PT to maximize her function while her pain becomes more managed.     Follow Up Recommendations SNF;Supervision/Assistance - 24 hour    Equipment Recommendations  Other (comment) (TBA)    Recommendations for Other Services       Precautions / Restrictions Precautions Precautions: Fall Restrictions Weight Bearing Restrictions: No      Mobility  Bed Mobility Overal bed mobility: Modified Independent             General bed mobility comments: heavy use of bed rails, increased time.  Transfers Overall transfer level: Needs assistance Equipment used: Rolling walker (2 wheeled) Transfers: Sit to/from Stand Sit to Stand: Min assist;From elevated surface         General transfer comment: Able to stand with min A and take a few side-shuffle steps to the head of the bed. Patient unable to stand upright, heavily leaned over on walker.  Ambulation/Gait             General Gait Details: Deferred due to pain.  Stairs            Wheelchair Mobility    Modified Rankin (Stroke Patients Only)       Balance Overall balance assessment: Needs  assistance Sitting-balance support: No upper extremity supported;Feet supported Sitting balance-Leahy Scale: Good     Standing balance support: Bilateral upper extremity supported Standing balance-Leahy Scale: Fair                               Pertinent Vitals/Pain Pain Assessment: 0-10 Pain Score: 10-Worst pain ever Pain Location: Back, radiating into legs. Pain Descriptors / Indicators: Burning;Grimacing;Shooting;Radiating Pain Intervention(s): Limited activity within patient's tolerance;Monitored during session;Repositioned;Relaxation;Utilized relaxation techniques    Home Living Family/patient expects to be discharged to:: Private residence Living Arrangements: Alone Available Help at Discharge: Family;Available PRN/intermittently (See Additional Comments) Type of Home: House Home Access: Stairs to enter Entrance Stairs-Rails: None Entrance Stairs-Number of Steps: 1 Home Layout: One level Home Equipment: Cane - single point;Walker - 2 wheels;Shower seat Additional Comments: Patient very hesitant to say that anyone is available to help her at discharge. Family comes by very occassionally.    Prior Function Level of Independence: Independent with assistive device(s)         Comments: Use of RW for minimal ambulation - reports walks to and from bathroom, able to make simple meals and sit at dining room chair for 10-20 mins before needing to lay back down.     Hand Dominance   Dominant Hand: Right    Extremity/Trunk Assessment   Upper Extremity Assessment: Generalized weakness           Lower  Extremity Assessment: Generalized weakness      Cervical / Trunk Assessment: Other exceptions (Cannot stand or sit fully upright due to pain.)  Communication   Communication: No difficulties  Cognition Arousal/Alertness: Awake/alert Behavior During Therapy: WFL for tasks assessed/performed Overall Cognitive Status: Within Functional Limits for tasks  assessed                      General Comments      Exercises        Assessment/Plan    PT Assessment Patient needs continued PT services  PT Diagnosis Difficulty walking;Acute pain;Other (comment) (Chronic pain exacerbation.)   PT Problem List Decreased strength;Decreased range of motion;Decreased activity tolerance;Decreased balance;Decreased mobility;Pain;Obesity  PT Treatment Interventions DME instruction;Gait training;Stair training;Functional mobility training;Therapeutic activities;Therapeutic exercise;Balance training;Neuromuscular re-education;Patient/family education;Modalities   PT Goals (Current goals can be found in the Care Plan section) Acute Rehab PT Goals Patient Stated Goal: Feel better PT Goal Formulation: With patient Time For Goal Achievement: 11/27/14 Potential to Achieve Goals: Good    Frequency Min 3X/week   Barriers to discharge Decreased caregiver support Has no assistance at home.    Co-evaluation               End of Session   Activity Tolerance: Patient limited by pain Patient left: in bed;with call bell/phone within reach (Sitting EOB to eat lunch.) Nurse Communication: Mobility status;Patient requests pain meds         Time: 1218-1241 PT Time Calculation (min) (ACUTE ONLY): 23 min   Charges:   PT Evaluation $Initial PT Evaluation Tier I: 1 Procedure PT Treatments $Therapeutic Activity: 8-22 mins   PT G CodesRoanna Epley, SPT [phone] 11/13/2014, 1:18 PM  I have read, reviewed and agree with student's note.   [place] [phone] (pager)

## 2014-11-13 NOTE — Care Management Note (Signed)
Case Management Note  Patient Details  Name: [name] MRN: [id number] Date of Birth: Dec 15, 1955  Subjective/Objective:  59 y.o. F admitted through ED with AMS. Pt has extensive PMH. Seen by PT/SLP today. OT unable to evaluate due to lethargy. PT recommends SNF. Will make CSW aware. There was some indication that AMS could be medication related, however no improvement was noted after 2 doses Narcan. (Takes Lyrica 159m po and Ativan 2 mg po QHS for sleep)                   Action/Plan:Will continue to follow for disposition and any other CM needs.    Expected Discharge Date:                  Expected Discharge Plan:  Skilled Nursing Facility  In-House Referral:  Clinical Social Work  Discharge planning Services  CM Consult  Post Acute Care Choice:    Choice offered to:     DME Arranged:    DME Agency:     HH Arranged:    [place] [facility]:     Status of Service:  In process, will continue to follow  Medicare Important Message Given:    Date Medicare IM Given:    Medicare IM give by:    Date Additional Medicare IM Given:    Additional Medicare Important Message give by:     If discussed at [place] Meet[place], dates discussed:    Additional Comments:  CDelrae Sawyers RN 11/13/2014, 2:30 PM

## 2014-11-13 NOTE — Procedures (Signed)
ELECTROENCEPHALOGRAM REPORT  Patient: [name]       Room #: 5K09 EEG No. ID: [id number] Age: 59 y.o.        Sex: female Referring Physician: Nena Alexander Report Date:  11/13/2014        Interpreting Physician: Anthony Sar  History: [name] is an 59 y.o. female with multiple medical problems including hypertension, depression, TIA, diabetes mellitus, chronic kidney disease and breast cancer, who was admitted for altered mental status and slurred speech. Acute encephalopathy associated with polypharmacy is suspected. Patient had a previous EEG on 10/30/2014 which reportedly was normal.  Indications for study:  Assess severity of encephalopathy; rule out seizure activity.  Technique: This is an 18 channel routine scalp EEG performed at the bedside with bipolar and monopolar montages arranged in accordance to the international 10/20 system of electrode placement.   Description: This EEG recording was performed during wakefulness. Patient was noted to be somewhat lethargic at the time of this study. Predominant background activity consisted of low amplitude diffuse irregular delta activity with superimposed 8 Hz activity recorded from the posterior head regions, as well as superimposed diffuse low amplitude fast beta activity. Photic stimulation produced symmetrical occipital driving response. Hyperventilation was not performed. No epileptiform discharges were recorded.  Interpretation: See EEG is abnormal with mild generalized nonspecific continuous slowing of cerebral activity. This pattern of slowing can be seen with toxic/metabolic etiologies as well as with sedating medications. No evidence of seizure activity was recorded.   Rush Farmer M.D. [facility] [phone]

## 2014-11-13 NOTE — Progress Notes (Signed)
CPAP is bedside and ready for use.  Auto titrate 7-20cmH20 via Large FFM. Patient said she will place on herself when ready for bed.  Patient aware if she needs further assistance to ask RN to call Respiratory.

## 2014-11-14 DIAGNOSIS — M545 Low back pain: Secondary | ICD-10-CM | POA: Diagnosis not present

## 2014-11-14 DIAGNOSIS — G8929 Other chronic pain: Secondary | ICD-10-CM

## 2014-11-14 DIAGNOSIS — E0842 Diabetes mellitus due to underlying condition with diabetic polyneuropathy: Secondary | ICD-10-CM

## 2014-11-14 DIAGNOSIS — I10 Essential (primary) hypertension: Secondary | ICD-10-CM

## 2014-11-14 DIAGNOSIS — G9340 Encephalopathy, unspecified: Secondary | ICD-10-CM | POA: Diagnosis not present

## 2014-11-14 DIAGNOSIS — N183 Chronic kidney disease, stage 3 (moderate): Secondary | ICD-10-CM | POA: Diagnosis not present

## 2014-11-14 DIAGNOSIS — F4310 Post-traumatic stress disorder, unspecified: Secondary | ICD-10-CM | POA: Clinically undetermined

## 2014-11-14 LAB — BASIC METABOLIC PANEL
Anion gap: 5 (ref 5–15)
BUN: 19 mg/dL (ref 6–20)
CO2: 27 mmol/L (ref 22–32)
CREATININE: 1.11 mg/dL — AB (ref 0.44–1.00)
Calcium: 8.9 mg/dL (ref 8.9–10.3)
Chloride: 111 mmol/L (ref 101–111)
GFR calc Af Amer: >60 mL/min (ref >60)
GFR, EST NON AFRICAN AMERICAN: 54 mL/min — AB (ref >60)
Glucose, Bld: 109 mg/dL — ABNORMAL HIGH (ref 65–99)
Potassium: 3.9 mmol/L (ref 3.5–5.1)
SODIUM: 143 mmol/L (ref 135–145)

## 2014-11-14 LAB — GLUCOSE, CAPILLARY
GLUCOSE-CAPILLARY: 110 mg/dL — AB (ref 65–99)
GLUCOSE-CAPILLARY: 114 mg/dL — AB (ref 65–99)
Glucose-Capillary: 111 mg/dL — ABNORMAL HIGH (ref 65–99)
Glucose-Capillary: 94 mg/dL (ref 65–99)

## 2014-11-14 LAB — CBC
HCT: 28 % — ABNORMAL LOW (ref 36.0–46.0)
Hemoglobin: 9.2 g/dL — ABNORMAL LOW (ref 12.0–15.0)
MCH: 28.2 pg (ref 26.0–34.0)
MCHC: 32.9 g/dL (ref 30.0–36.0)
MCV: 85.9 fL (ref 78.0–100.0)
PLATELETS: 219 10*3/uL (ref 150–400)
RBC: 3.26 MIL/uL — ABNORMAL LOW (ref 3.87–5.11)
RDW: 18.8 % — ABNORMAL HIGH (ref 11.5–15.5)
WBC: 9.3 10*3/uL (ref 4.0–10.5)

## 2014-11-14 MED ORDER — TAPENTADOL HCL 50 MG PO TABS
200.0000 mg | ORAL_TABLET | Freq: Two times a day (BID) | ORAL | Status: DC
  Administered 2014-11-14 – 2014-11-15 (×3): 200 mg via ORAL
  Filled 2014-11-14 (×5): qty 4

## 2014-11-14 MED ORDER — PREGABALIN 75 MG PO CAPS
150.0000 mg | ORAL_CAPSULE | Freq: Two times a day (BID) | ORAL | Status: DC
  Administered 2014-11-14 – 2014-11-15 (×3): 150 mg via ORAL
  Filled 2014-11-14 (×3): qty 2

## 2014-11-14 MED ORDER — OXYCODONE HCL 5 MG PO TABS
15.0000 mg | ORAL_TABLET | Freq: Four times a day (QID) | ORAL | Status: DC | PRN
  Administered 2014-11-14 – 2014-11-15 (×5): 15 mg via ORAL
  Filled 2014-11-14 (×5): qty 3

## 2014-11-14 NOTE — Clinical Social Work Note (Signed)
CSW Consult Acknowledged:   CSW received a consult for SNF placement. CSW met pt at the bedside to discussing SNF placement. Pt reported that she prefers to go home with [facility]. CSW will sign off.       [place], MSW, [place]

## 2014-11-14 NOTE — Evaluation (Signed)
Occupational Therapy Evaluation Patient Details Name: [name] MRN: [id number] DOB: November 03, 1955 Today's Date: 11/14/2014    History of Present Illness 59 y.o. female with PMH of HTN, HLD, GERD, depression, chronic low back pain, medically neuropathy, asthma, [place] Fero, DM2, Hypothyroid, CKD-III, OSA, previous Breast Ca (S/P Lumpectomy and Radiation Rx), who presents with altered mental status and possible slurred speech. Pt was very lethargic and visually hallucinating. Patient was recently hospitalized from 9/9 to 10/30/14 due to syncope.    Clinical Impression   Pt was struggling at home to care for herself and her home due to chronic back pain and hx of syncope, but managing.  Pt with 10/10 pain premedicated, crying throughout session, but wanting to bathe and get to chair to eat.  Requiring min guard assist for OOB mobility, pt with flexed posture due to pain.  Pt requires assist for LB ADL. Will follow acutely.  Recommending ST rehab prior to return home, but pt is refusing.    Follow Up Recommendations  Home health OT (Pt is declining ST rehab in SNF per SW)    Equipment Recommendations  None recommended by OT    Recommendations for Other Services       Precautions / Restrictions Precautions Precautions: Fall Precaution Comments: long hx of back pain Restrictions Weight Bearing Restrictions: No      Mobility Bed Mobility Overal bed mobility: Modified Independent             General bed mobility comments: heavy use of bed rails, increased time, HOB up slightly  Transfers Overall transfer level: Needs assistance Equipment used: Rolling walker (2 wheeled) Transfers: Sit to/from Stand Sit to Stand: Min guard         General transfer comment: flexed posture with leaning on walker, able to take several steps to chair after bathing at EOB    Balance Overall balance assessment: Needs assistance   Sitting balance-Leahy Scale: Good       Standing balance-Leahy  Scale: Fair Standing balance comment: able to release walker with one hand to clean periarea                            ADL Overall ADL's : Needs assistance/impaired Eating/Feeding: Independent;Sitting   Grooming: Wash/dry hands;Wash/dry face;Sitting;Set up   Upper Body Bathing: Minimal assitance;Sitting Upper Body Bathing Details (indicate cue type and reason): assisted with back Lower Body Bathing: Maximal assistance;Sit to/from stand   Upper Body Dressing : Sitting;Set up   Lower Body Dressing: Maximal assistance;Sit to/from stand   Toilet Transfer: Min Insurance claims handler Details (indicate cue type and reason): simulated bed to chair Toileting- Clothing Manipulation and Hygiene: Moderate assistance;Sit to/from stand Toileting - Clothing Manipulation Details (indicate cue type and reason): assisted pt with pericare and gown     Functional mobility during ADLs: Min guard;Rolling walker       Vision     Perception     Praxis      Pertinent Vitals/Pain Pain Assessment: 0-10 Pain Score: 10-Worst pain ever Pain Location: back Pain Descriptors / Indicators: Aching Pain Intervention(s): Monitored during session;Premedicated before session;Repositioned;Limited activity within patient's tolerance     Hand Dominance Right   Extremity/Trunk Assessment Upper Extremity Assessment Upper Extremity Assessment: Overall WFL for tasks assessed   Lower Extremity Assessment Lower Extremity Assessment: Defer to PT evaluation   Cervical / Trunk Assessment Cervical / Trunk Assessment: Other exceptions (flexed posture in standing due to pain)  Communication Communication Communication: No difficulties   Cognition Arousal/Alertness: Awake/alert Behavior During Therapy: Flat affect (crying throughout) Overall Cognitive Status: Within Functional Limits for tasks assessed                     General Comments       Exercises        Shoulder Instructions      Home Living Family/patient expects to be discharged to:: Private residence Living Arrangements: Alone Available Help at Discharge: Family;Available PRN/intermittently Type of Home: House Home Access: Stairs to enter [employer] of Steps: 1 Entrance Stairs-Rails: None Home Layout: One level     Bathroom Shower/Tub: Walk-in Hydrologist: Standard Bathroom Accessibility: Yes How Accessible: Accessible via walker Home Equipment: Cane - single point;Walker - 2 wheels;Shower seat   Additional Comments: son helps to get groceries, per pt she was supposed to have an aide starting prior to admission      Prior Functioning/Environment Level of Independence: Independent with assistive device(s)        Comments: Use of RW for minimal ambulation - reports walks to and from bathroom, able to make simple meals and sit at dining room chair for 10-20 mins before needing to lay back down.    OT Diagnosis: Generalized weakness;Acute pain   OT Problem List: Decreased activity tolerance;Impaired balance (sitting and/or standing);Decreased knowledge of use of DME or AE;Obesity;Pain   OT Treatment/Interventions: Self-care/ADL training;DME and/or AE instruction;Patient/family education    OT Goals(Current goals can be found in the care plan section) Acute Rehab OT Goals Patient Stated Goal: Feel better OT Goal Formulation: With patient Time For Goal Achievement: 11/28/14 Potential to Achieve Goals: Fair ADL Goals Pt Will Perform Grooming: with modified independence;standing Pt Will Perform Upper Body Bathing: with modified independence;with adaptive equipment;sitting Pt Will Perform Lower Body Bathing: with modified independence;with adaptive equipment;sit to/from stand Pt Will Perform Upper Body Dressing: with modified independence;sitting Pt Will Perform Lower Body Dressing: with modified independence;sit to/from stand;with adaptive  equipment Pt Will Transfer to Toilet: with modified independence;ambulating Pt Will Perform Toileting - Clothing Manipulation and hygiene: with modified independence;sit to/from stand  OT Frequency: Min 2X/week   Barriers to D/C: Decreased caregiver support          Co-evaluation              End of Session Equipment Utilized During Treatment: Rolling walker  Activity Tolerance: Patient limited by pain Patient left: in chair;with call bell/phone within reach;with chair alarm set   Time: 0211-1735 OT Time Calculation (min): 33 min Charges:  OT General Charges $OT Visit: 1 Procedure OT Evaluation $Initial OT Evaluation Tier I: 1 Procedure OT Treatments $Self Care/Home Management : 8-22 mins G-Codes: OT G-codes **NOT FOR INPATIENT CLASS** Functional Assessment Tool Used: clinical judgement Functional Limitation: Self care Self Care Current Status (A7014): At least 60 percent but less than 80 percent impaired, limited or restricted Self Care Goal Status (D0301): At least 1 percent but less than 20 percent impaired, limited or restricted  Malka So 11/14/2014, 12:56 PM  [phone]

## 2014-11-14 NOTE — Consult Note (Signed)
NEURO HOSPITALIST CONSULT NOTE   Referring physician: Ghimire   Reason for Consult: episodes of LOC ? Sz ? syncope  HPI:                                                                                                                                          [name] is an 59 y.o. female with a broad past medical history. Patient states three months ago she had a episode  In which she was getting ready to visit her daughter at 0100 hours and suddenly passed out. She was on her bed when this occurred. She was found by her daughter 0600. At that time she was confused for about 1 hour.  She did not seek attention at that time. This occurred again a week later.  Again she was on her bed and had a period of multiple hours where she was not alert. She states all of the events occur when she is in bed or in a chair and no one has witnessed a actual event. She has been to the hospital multiple times for this and had a large work up including 2 D echo, Carotid doppler, EEG, MRI brain all which have been unrevealing. Patient was again brought to the hospital due to similar episode. This event she states she was getting her medication ready and her son called her.  HE noted she had slurred speech.  After she hung the phone up she went to the couch and cannot recall anything.  Her daughter came home 12 hours later and found her mother on the couch foaming at the mouth, lethargic and visually hallucinating. Patietn states she never took the medication prior to this event.   Past Medical History  Diagnosis Date  . Depression   . Thyroid disease   . GERD (gastroesophageal reflux disease)   . Pancreatitis     hx of  . Knee pain, bilateral   . Obesity   . HSV (herpes simplex virus) infection   . Hyperlipemia   . Asthma   . Tachycardia     with sx in 2008  . Breast mass in female     L breast 2008, US showed likely fat necrosis  . Anxiety   . Hypertension     sees Dr. Criss Rosales ,  [facility]  . Hx of radiation therapy 05/05/12- 07/15/12    right breast, 45 gray x 25 fx, lumpectomy cavity boosted to 16.2 gray  . History of blood transfusion 2011    "after one of my OR's"  . Hypothyroidism   . 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 Polyneuropathy in diabetes(357.2)   . Benign paroxysmal positional vertigo 12/30/2012  . Breast cancer 02/13/12    ruq  100'clock bx Ductal Carcinoma in Situ,(0/1)  lymph node neg.  . OSA on CPAP     pt does not know settings  . Pneumonia 1950's  . Anemia   . Type II diabetes mellitus   . History of hiatal hernia   . History of stomach ulcers   . Daily headache   . Migraines     "~ 3 times/month" (01/06/2014)  . Arthritis     "pretty much all my joints"  . Chronic lower back pain   . Schizophrenia   . Kidney stones   . CKD (chronic kidney disease), stage III     "lower stage" (01/06/2014)    Past Surgical History  Procedure Laterality Date  . Total knee arthroplasty Bilateral 2009-06/2009    left; right  . Foot fracture surgery Right 1990's  . Shoulder open rotator cuff repair Left 1990's  . Thyroidectomy  1970's  . Joint replacement    . Abdominal hysterectomy  1979?    partial  . Cholecystectomy  1980's?  . Colonoscopy with propofol N/A 09/28/2012    Procedure: COLONOSCOPY WITH PROPOFOL;  Surgeon: Lear Ng, MD;  Location: [facility];  Service: Endoscopy;  Laterality: N/A;  . Revision total knee arthroplasty  07/2009  . Breast surgery Right 01/2012    "cancer"  . Breast lumpectomy  03/03/2012    Procedure: LUMPECTOMY;  Surgeon: Haywood Lasso, MD;  Location: [facility] OR;  Service: General;  Laterality: Right;    Family History  Problem Relation Age of Onset  . Heart disease Brother     Multiple MIs, starting in his 3s  . Diabetes Brother   . Hypertension Mother   . Diabetes Mother   . Breast cancer Mother 58  . Bone cancer Mother   . Hypertension Sister   . Diabetes Sister   . Breast cancer Sister 18   . Breast cancer Maternal Grandmother   . Heart disease Maternal Grandmother   . Uterine cancer Other 19  . Breast cancer Paternal Aunt 55  . Breast cancer Paternal Grandmother     dx in her 27s  . Prostate cancer Paternal Grandfather     Social History:  reports that she quit smoking about 22 years ago. Her smoking use included Cigarettes. She has a 7 pack-year smoking history. She has never used smokeless tobacco. She reports that she does not drink alcohol or use illicit drugs.  Allergies  Allergen Reactions  . Shellfish Allergy Shortness Of Breath  . Bee Venom Hives  . Morphine And Related Other (See Comments)    Overly sedated    MEDICATIONS:                                                                                                                     Prior to Admission:  Prescriptions prior to admission  Medication Sig Dispense Refill Last Dose  . aspirin EC 81 MG tablet Take 1 tablet (81 mg total) by mouth daily. (Patient taking differently: Take 81 mg by mouth at bedtime. ) 30  tablet 0 Past Week at Unknown time  . cholecalciferol (VITAMIN D) 1000 UNITS tablet Take 1 tablet (1,000 Units total) by mouth daily. 30 tablet 12 Past Month at Unknown time  . diclofenac sodium (VOLTAREN) 1 % GEL Apply 2 g topically 2 (two) times daily as needed (pain).    Past Month at Unknown time  . esomeprazole (NEXIUM) 40 MG capsule Take 1 capsule (40 mg total) by mouth daily before breakfast. 30 capsule 1 11/11/2014 at Unknown time  . ferrous sulfate 325 (65 FE) MG EC tablet Take 325 mg by mouth daily with breakfast.   Past Month at Unknown time  . gabapentin (NEURONTIN) 100 MG capsule Take 100-300 mg by mouth 3 (three) times daily. Takes 117m in am, 1060min pm, and 30028mt bedtime   11/11/2014 at Unknown time  . insulin aspart (NOVOLOG) 100 UNIT/ML injection Inject 0-6 Units into the skin 3 (three) times daily as needed for high blood sugar (200-299 = 4 units, 300 and above = 6 units).  Sliding scale   Past Week at Unknown time  . insulin detemir (LEVEMIR) 100 UNIT/ML injection Inject 10 Units into the skin at bedtime.   Past Week at Unknown time  . Iron-Vitamins (GERITOL COMPLETE) TABS Take 1 tablet by mouth daily.   Past Month at Unknown time  . levothyroxine (SYNTHROID, LEVOTHROID) 150 MCG tablet Take 150 mcg by mouth daily before breakfast.   11/11/2014 at Unknown time  . LORazepam (ATIVAN) 2 MG tablet Take 1 tablet (2 mg total) by mouth every 12 (twelve) hours as needed for anxiety.  0 Past Week at Unknown time  . meloxicam (MOBIC) 15 MG tablet Take 15 mg by mouth daily.    11/11/2014 at Unknown time  . NUCYNTA ER 200 MG TB12 Take 200 mg by mouth every 12 (twelve) hours.   11/11/2014 at Unknown time  . oxyCODONE (ROXICODONE) 15 MG immediate release tablet Take 1 tablet (15 mg total) by mouth every 8 (eight) hours as needed for pain. (Patient taking differently: Take 15 mg by mouth 6 (six) times daily. ) 30 tablet 0 11/11/2014 at Unknown time  . pregabalin (LYRICA) 75 MG capsule Take 2 capsules (150 mg total) by mouth at bedtime. 2 tablets at bedtime (Patient taking differently: Take 150 mg by mouth 2 (two) times daily. )   Past Week at Unknown time  . rosuvastatin (CRESTOR) 20 MG tablet Take 20 mg by mouth daily.   11/11/2014 at Unknown time  . sitaGLIPtin-metformin (JANUMET) 50-500 MG per tablet Take 1 tablet by mouth 2 (two) times daily with a meal.   11/11/2014 at Unknown time  . valACYclovir (VALTREX) 1000 MG tablet Take 1,000 mg by mouth 2 (two) times daily.    Past Week at Unknown time  . Venlafaxine HCl 150 MG TB24 Take 150 mg by mouth daily.   11/11/2014 at Unknown time  . ziprasidone (GEODON) 20 MG capsule Take 20 mg by mouth every evening.   11/11/2014 at Unknown time   Scheduled: . aspirin EC  81 mg Oral QHS  . cholecalciferol  1,000 Units Oral Daily  . esomeprazole  40 mg Oral Q1200  . ferrous sulfate  325 mg Oral Q breakfast  . heparin subcutaneous  5,000 Units  Subcutaneous 3 times per day  . insulin aspart  0-9 Units Subcutaneous TID WC  . insulin detemir  5 Units Subcutaneous QHS  . levothyroxine  150 mcg Oral QAC breakfast  . multivitamin with minerals  1 tablet Oral Daily  . pregabalin  150 mg Oral BID  . rosuvastatin  20 mg Oral Daily  . sodium chloride  3 mL Intravenous Q12H  . tapentadol  200 mg Oral Q12H  . valACYclovir  1,000 mg Oral BID  . ziprasidone  20 mg Oral QPM     ROS:                                                                                                                                       History obtained from the patient  General ROS: negative for - chills, fatigue, fever, night sweats, weight gain or weight loss Psychological ROS: negative for - behavioral disorder, hallucinations, memory difficulties, mood swings or suicidal ideation Ophthalmic ROS: negative for - blurry vision, double vision, eye pain or loss of vision ENT ROS: negative for - epistaxis, nasal discharge, oral lesions, sore throat, tinnitus or vertigo Allergy and Immunology ROS: negative for - hives or itchy/watery eyes Hematological and Lymphatic ROS: negative for - bleeding problems, bruising or swollen lymph nodes Endocrine ROS: negative for - galactorrhea, hair pattern changes, polydipsia/polyuria or temperature intolerance Respiratory ROS: negative for - cough, hemoptysis, shortness of breath or wheezing Cardiovascular ROS: negative for - chest pain, dyspnea on exertion, edema or irregular heartbeat Gastrointestinal ROS: negative for - abdominal pain, diarrhea, hematemesis, nausea/vomiting or stool incontinence Genito-Urinary ROS: negative for - dysuria, hematuria, incontinence or urinary frequency/urgency Musculoskeletal ROS: negative for - joint swelling or muscular weakness Neurological ROS: as noted in HPI Dermatological ROS: negative for rash and skin lesion changes   Blood pressure 145/74, pulse 78, temperature 99.5 F (37.5 C),  temperature source Oral, resp. rate 20, height _0  (1.651 m), weight 136.3 kg (300 lb 7.8 oz), SpO2 96 %.   Neurologic Examination:                                                                                                      HEENT-  Normocephalic, no lesions, without obvious abnormality.  Normal external eye and conjunctiva.  Normal TM's bilaterally.  Normal auditory canals and external ears. Normal external nose, mucus membranes and septum.  Normal pharynx. Cardiovascular- S1, S2 normal, pulses palpable throughout   Lungs- chest clear, no wheezing, rales, normal symmetric air entry Abdomen- normal findings: bowel sounds normal Extremities- no edema Lymph-no adenopathy palpable Musculoskeletal-no joint tenderness, deformity or swelling Skin-warm and dry, no hyperpigmentation, vitiligo, or suspicious lesions  Neurological Examination Mental Status: Alert, oriented, thought content  appropriate.  Speech fluent without evidence of aphasia.  Able to follow 3 step commands without difficulty. Cranial Nerves: II: Discs flat bilaterally; Visual fields grossly normal, pupils equal, round, reactive to light and accommodation III,IV, VI: ptosis bilaterallyt, extra-ocular motions intact bilaterally V,VII: smile symmetric, facial light touch sensation normal bilaterally VIII: hearing normal bilaterally IX,X: uvula rises symmetrically XI: bilateral shoulder shrug XII: midline tongue extension Motor: Right : Upper extremity   5/5    Left:     Upper extremity   5/5  Lower extremity   5/5     Lower extremity   5/5 Tone and bulk:normal tone throughout; no atrophy noted Sensory: Pinprick and light touch intact throughout, bilaterally Deep Tendon Reflexes: 2+ and symmetric throughout  Plantars: Right: up going   Left: downgoing Cerebellar: normal finger-to-nose,  Gait: not tested due to sever pain.       Lab Results: Basic Metabolic Panel:  Recent Labs Lab 11/12/14 1935  11/12/14 1940 11/13/14 0408 11/14/14 0546  NA 137 138 140 143  K 4.4 4.3 3.9 3.9  CL 104 103 107 111  CO2 22  --  26 27  GLUCOSE 110* 110* 108* 109*  BUN 26* 29* 20 19  CREATININE 1.41* 1.40* 1.06* 1.11*  CALCIUM 9.4  --  8.9 8.9    Liver Function Tests:  Recent Labs Lab 11/12/14 1935  AST 19  ALT 21  ALKPHOS 76  BILITOT 0.2*  PROT 6.7  ALBUMIN 3.6   No results for input(s): LIPASE, AMYLASE in the last 168 hours.  Recent Labs Lab 11/12/14 2300  AMMONIA 14    CBC:  Recent Labs Lab 11/12/14 1935 11/12/14 1940 11/13/14 0408 11/14/14 0546  WBC 17.5*  --  13.5* 9.3  NEUTROABS 14.0*  --   --   --   HGB 11.0* 12.9 9.1* 9.2*  HCT 34.9* 38.0 28.6* 28.0*  MCV 87.5  --  86.4 85.9  PLT 297  --  237 219    Cardiac Enzymes: No results for input(s): CKTOTAL, CKMB, CKMBINDEX, TROPONINI in the last 168 hours.  Lipid Panel: No results for input(s): CHOL, TRIG, HDL, CHOLHDL, VLDL, LDLCALC in the last 168 hours.  CBG:  Recent Labs Lab 11/13/14 0730 11/13/14 1743 11/13/14 2001 11/13/14 2146 11/14/14 0635  GLUCAP 94 124* 126* 80 110*    Microbiology: Results for orders placed or performed during the hospital encounter of 11/12/14  Culture, blood (routine x 2)     Status: None (Preliminary result)   Collection Time: 11/12/14 10:25 PM  Result Value Ref Range Status   Specimen Description BLOOD LEFT HAND  Final   Special Requests BOTTLES DRAWN AEROBIC ONLY 3CC  Final   Culture NO GROWTH < 24 HOURS  Final   Report Status PENDING  Incomplete  MRSA PCR Screening     Status: None   Collection Time: 11/13/14  3:52 AM  Result Value Ref Range Status   MRSA by PCR NEGATIVE NEGATIVE Final    Comment:        The GeneXpert MRSA Assay (FDA approved for NASAL specimens only), is one component of a comprehensive MRSA colonization surveillance program. It is not intended to diagnose MRSA infection nor to guide or monitor treatment for MRSA infections.      Coagulation Studies:  Recent Labs  11/12/14 1935  LABPROT 13.9  INR 1.05    Imaging: Ct Head Wo Contrast  11/12/2014   CLINICAL DATA:  Altered mental status.  Lethargic.  EXAM: CT  HEAD WITHOUT CONTRAST  TECHNIQUE: Contiguous axial images were obtained from the base of the skull through the vertex without intravenous contrast.  COMPARISON:  Head MRI 10/29/2014 and CT 10/28/2014  FINDINGS: A mildly expanded partially empty sella is again noted. There is no evidence of acute cortical infarct, intracranial hemorrhage, mass, midline shift, or extra-axial fluid collection. Ventricles and sulci are normal for age.  Bilateral exophthalmos is unchanged. Cerumen/ debris is noted in both external auditory canals. The mastoid air cells are clear. Small amount of left sphenoid sinus fluid and focal mucosal thickening or mucous retention cyst in the right sphenoid sinus are similar to the prior MRI.  IMPRESSION: No evidence of acute intracranial abnormality.   Electronically Signed   By: Logan Bores M.D.   On: 11/12/2014 20:27   Mr Brain Wo Contrast  11/13/2014   CLINICAL DATA:  Initial evaluation for acute slurred speech.  EXAM: MRI HEAD WITHOUT CONTRAST  TECHNIQUE: Multiplanar, multiecho pulse sequences of the brain and surrounding structures were obtained without intravenous contrast.  COMPARISON:  Prior CT from earlier the same day as well as previous MRI from 10/29/2014.  FINDINGS: The CSF containing spaces are within normal limits for patient age. No focal parenchymal signal abnormality is identified. No mass lesion, midline shift, or extra-axial fluid collection. Ventricles are normal in size without evidence of hydrocephalus.  No diffusion-weighted signal abnormality is identified to suggest acute intracranial infarct. Gray-white matter differentiation is maintained. Normal flow voids are seen within the intracranial vasculature. No intracranial hemorrhage identified.  The cervicomedullary junction is  normal. Incidental note made of an empty sella. Bilateral exophthalmos again noted. No acute abnormality about the globes and orbits.  The bone marrow signal intensity is normal. Calvarium is intact. Visualized upper cervical spine is within normal limits.  Scalp soft tissues are unremarkable.  Small amount layering opacity within the left sphenoid sinus. Paranasal sinuses are otherwise clear. Small left mastoid effusion.  IMPRESSION: 1. No acute intracranial infarct or other process identified. 2. Stable bilateral exophthalmos. No retrobulbar process. Findings could be related to underlying thyroid disease. Correlation with laboratory values recommended. 3. Otherwise normal brain MRI.   Electronically Signed   By: Jeannine Boga M.D.   On: 11/13/2014 00:45   Dg Chest Port 1 View  11/12/2014   CLINICAL DATA:  Acute encephalopathy.  EXAM: PORTABLE CHEST 1 VIEW  COMPARISON:  10/27/2014  FINDINGS: Lung volumes are low. Borderline cardiomegaly. Prominence of the bronchovascular structures, may be related to low lung volumes versus vascular congestion. No confluent airspace disease, large pleural effusion or pneumothorax. Surgical clips project over the thoracic inlet.  IMPRESSION: Hypoventilatory chest. There is borderline cardiomegaly, which may be accentuated by low lung volumes. Similarly, prominence of bronchovascular structures, low lung volumes versus vascular congestion.   Electronically Signed   By: Jeb Levering M.D.   On: 11/12/2014 22:51     Assessment and plan per attending neurologist  Etta Quill PA-C [facility] [phone]  11/14/2014, 10:45 AM I have seen and evaluated the patient. I have reviewed the above note and made appropriate changes.    Assessment/Plan: 59 year old female with recurrent episodes of confusion. She has not had any true witnessed loss of consciousness or seizure-like activity. One possibility which I think is very real would be medication  related episodes. I advised the patient to use a pill planner to avoid accidentally taking multiple doses, and given the number of psychoactive medicines that she is on I think that this would  be a possible concern.  Seizures could be possible, but I am hesitant to adjust her medications at this time given that I am not at all certain that these are seizures. 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 One option would be a prolonged ambulatory EEG to try and capture one of these episodes.   1) continue Lyrica 150 twice a day 2) consider ambulatory EEG as outpatient 3) please call neurology if any further questions or concerns remain.   Roland Rack, MD [facility] [phone]  If 7pm- 7am, please page neurology on call as listed in [place].

## 2014-11-14 NOTE — Progress Notes (Signed)
Pt. Refused cpap for tonight. 

## 2014-11-14 NOTE — Progress Notes (Signed)
[facility] PROGRESS NOTE  [name] ZYS:[id number] DOB: 04-26-1955 DOA: 11/12/2014 PCP: [name], MD  Subjective:  Much improved today, awake and alert, answering questions appropriately  Assessment/Plan: Principal Problem: Acute encephalopathy: Improving, patient awake and alert this morning. Suspect toxic encephalopathy from polypharmacy. Restarting narcotics today as confusion and sedation has resolved. Neurology consulted to further investigate, EEG showing no signs of seizure activity and confirming slowed cerebral activity indicating toxic/metabolic etiologies. Psych consult pending to review medications (Geodon). New PT eval pending as patient was not yet at baseline yesterday.    Active Problems: Hypothyroidism: Continue with Synthroid  HLD (hyperlipidemia): Continue statin  Depression/schizophrenia: Continue Geodon and venlafaxine. Geodon is new to her, Psych consult placed to review medication-given polypharmacy/encephalopathy on admission  Essential hypertension: Controlled, not on any antihypertensives as outpatient - follow - if blood pressure persistently elevated, will start antihypertensives over the next few days  Type 2 diabetes: CBGs stable, well controlled. Continue with Levemir 5 units and SSI. Oral hypoglycemics to be resumed on discharge  CKD stage III: Creatinine at usual baseline. Follow.  Chronic back pain/diabetic polyneuropathy: Restart narcotics-as risk for withdrawal, will also restart Lyrica  Asthma: No signs of exacerbation - lungs clear, breathing unlabored on room air - continue with as needed albuterol.  OSA: Continue CPAP  GERD: Continue PPI  DVT Prophylaxis: Prophylactic heparin Code Status: Full code Family Communication: Daughter over the phone Disposition Plan: Home with home health services-refuses SNF-either today or tomorrow  Consultants:  Neurology  Psychiatry  Procedures:  EEG  Antimicrobial  Agents:  Valacyclovir 11/13/2014 >>   Objective: Filed Vitals:   11/14/14 0628  BP: 143/81  Pulse: 81  Temp: 98.6 F (37 C)  Resp: 12    Intake/Output Summary (Last 24 hours) at 11/14/14 0922 Last data filed at 11/13/14 1000  Gross per 24 hour  Intake    243 ml  Output      0 ml  Net    243 ml   Filed Weights   11/12/14 2016 11/13/14 0042 11/13/14 2115  Weight: 131.997 kg (291 lb) 139 kg (306 lb 7 oz) 136.3 kg (300 lb 7.8 oz)   Exam:   General:  Awake and alert, NAD  Neck: Supple  Cardiovascular: RRR, no murmurs   Respiratory: CTAB, unlabored  Abdomen: Soft, non tender, non distended, +BS  Musculoskeletal: Full ROM in all 4, strength equal and appropriate in all 4  Neuro: No focal deficits  Skin: No rashes, lesions, or cyanosis on limited exam   Data Reviewed: Basic Metabolic Panel:  Recent Labs Lab 11/12/14 1935 11/12/14 1940 11/13/14 0408  NA 137 138 140  K 4.4 4.3 3.9  CL 104 103 107  CO2 22  --  26  GLUCOSE 110* 110* 108*  BUN 26* 29* 20  CREATININE 1.41* 1.40* 1.06*  CALCIUM 9.4  --  8.9   Liver Function Tests:  Recent Labs Lab 11/12/14 1935  AST 19  ALT 21  ALKPHOS 76  BILITOT 0.2*  PROT 6.7  ALBUMIN 3.6   No results for input(s): LIPASE, AMYLASE in the last 168 hours.  Recent Labs Lab 11/12/14 2300  AMMONIA 14   CBC:  Recent Labs Lab 11/12/14 1935 11/12/14 1940 11/13/14 0408 11/14/14 0546  WBC 17.5*  --  13.5* 9.3  NEUTROABS 14.0*  --   --   --   HGB 11.0* 12.9 9.1* 9.2*  HCT 34.9* 38.0 28.6* 28.0*  MCV 87.5  --  86.4 85.9  PLT 297  --  237 219   Cardiac Enzymes: No results for input(s): CKTOTAL, CKMB, CKMBINDEX, TROPONINI in the last 168 hours. BNP (last 3 results) No results for input(s): BNP in the last 8760 hours.  ProBNP (last 3 results)  Recent Labs  01/06/14 1010  PROBNP 14.5   CBG:  Recent Labs Lab 11/13/14 0730 11/13/14 1743 11/13/14 2001 11/13/14 2146 11/14/14 0635  GLUCAP 94 124* 126*  94 110*   Recent Results (from the past 240 hour(s))  Culture, blood (routine x 2)     Status: None (Preliminary result)   Collection Time: 11/12/14 10:25 PM  Result Value Ref Range Status   Specimen Description BLOOD LEFT HAND  Final   Special Requests BOTTLES DRAWN AEROBIC ONLY 3CC  Final   Culture NO GROWTH < 24 HOURS  Final   Report Status PENDING  Incomplete  MRSA PCR Screening     Status: None   Collection Time: 11/13/14  3:52 AM  Result Value Ref Range Status   MRSA by PCR NEGATIVE NEGATIVE Final    Comment:        The GeneXpert MRSA Assay (FDA approved for NASAL specimens only), is one component of a comprehensive MRSA colonization surveillance program. It is not intended to diagnose MRSA infection nor to guide or monitor treatment for MRSA infections.     Studies: Ct Head Wo Contrast  11/12/2014   CLINICAL DATA:  Altered mental status.  Lethargic.  EXAM: CT HEAD WITHOUT CONTRAST  TECHNIQUE: Contiguous axial images were obtained from the base of the skull through the vertex without intravenous contrast.  COMPARISON:  Head MRI 10/29/2014 and CT 10/28/2014  FINDINGS: A mildly expanded partially empty sella is again noted. There is no evidence of acute cortical infarct, intracranial hemorrhage, mass, midline shift, or extra-axial fluid collection. Ventricles and sulci are normal for age.  Bilateral exophthalmos is unchanged. Cerumen/ debris is noted in both external auditory canals. The mastoid air cells are clear. Small amount of left sphenoid sinus fluid and focal mucosal thickening or mucous retention cyst in the right sphenoid sinus are similar to the prior MRI.  IMPRESSION: No evidence of acute intracranial abnormality.   Electronically Signed   By: Logan Bores M.D.   On: 11/12/2014 20:27   Mr Brain Wo Contrast  11/13/2014   CLINICAL DATA:  Initial evaluation for acute slurred speech.  EXAM: MRI HEAD WITHOUT CONTRAST  TECHNIQUE: Multiplanar, multiecho pulse sequences of the  brain and surrounding structures were obtained without intravenous contrast.  COMPARISON:  Prior CT from earlier the same day as well as previous MRI from 10/29/2014.  FINDINGS: The CSF containing spaces are within normal limits for patient age. No focal parenchymal signal abnormality is identified. No mass lesion, midline shift, or extra-axial fluid collection. Ventricles are normal in size without evidence of hydrocephalus.  No diffusion-weighted signal abnormality is identified to suggest acute intracranial infarct. Gray-white matter differentiation is maintained. Normal flow voids are seen within the intracranial vasculature. No intracranial hemorrhage identified.  The cervicomedullary junction is normal. Incidental note made of an empty sella. Bilateral exophthalmos again noted. No acute abnormality about the globes and orbits.  The bone marrow signal intensity is normal. Calvarium is intact. Visualized upper cervical spine is within normal limits.  Scalp soft tissues are unremarkable.  Small amount layering opacity within the left sphenoid sinus. Paranasal sinuses are otherwise clear. Small left mastoid effusion.  IMPRESSION: 1. No acute intracranial infarct or other process identified. 2. Stable  bilateral exophthalmos. No retrobulbar process. Findings could be related to underlying thyroid disease. Correlation with laboratory values recommended. 3. Otherwise normal brain MRI.   Electronically Signed   By: Jeannine Boga M.D.   On: 11/13/2014 00:45   Dg Chest Port 1 View  11/12/2014   CLINICAL DATA:  Acute encephalopathy.  EXAM: PORTABLE CHEST 1 VIEW  COMPARISON:  10/27/2014  FINDINGS: Lung volumes are low. Borderline cardiomegaly. Prominence of the bronchovascular structures, may be related to low lung volumes versus vascular congestion. No confluent airspace disease, large pleural effusion or pneumothorax. Surgical clips project over the thoracic inlet.  IMPRESSION: Hypoventilatory chest. There is  borderline cardiomegaly, which may be accentuated by low lung volumes. Similarly, prominence of bronchovascular structures, low lung volumes versus vascular congestion.   Electronically Signed   By: Jeb Levering M.D.   On: 11/12/2014 22:51   Scheduled Meds: . aspirin EC  81 mg Oral QHS  . cholecalciferol  1,000 Units Oral Daily  . esomeprazole  40 mg Oral Q1200  . ferrous sulfate  325 mg Oral Q breakfast  . heparin subcutaneous  5,000 Units Subcutaneous 3 times per day  . insulin aspart  0-9 Units Subcutaneous TID WC  . insulin detemir  5 Units Subcutaneous QHS  . levothyroxine  150 mcg Oral QAC breakfast  . multivitamin with minerals  1 tablet Oral Daily  . pregabalin  150 mg Oral BID  . rosuvastatin  20 mg Oral Daily  . sodium chloride  3 mL Intravenous Q12H  . tapentadol  200 mg Oral Q12H  . valACYclovir  1,000 mg Oral BID  . ziprasidone  20 mg Oral QPM   Continuous Infusions:   Principal Problem:   Acute encephalopathy Active Problems:   Hypothyroidism   HLD (hyperlipidemia)   Depression   Essential hypertension   Asthma   GERD   KNEE PAIN   Secondary parkinsonism   Dysarthria   Recurrent falls   Type II diabetes mellitus   OSA on CPAP   Chronic lower back pain   Diabetic polyneuropathy   TIA (transient ischemic attack)   Leukocytosis   Altered mental status   CKD (chronic kidney disease), stage III   Slurred speech  Time spent: [place], Student-PA   [facility] If 7PM-7AM, please contact night-coverage at [email or web address], password [email or web address] 11/14/2014, 9:22 AM  LOS: 2 days    Attending MD note  Patient was seen, examined,treatment plan was discussed with the PA-S.  I have personally reviewed the clinical findings, lab, imaging studies and management of this patient in detail. I agree with the documentation, as recorded by the PA-S.   Awake and alert-complaining of back pain-once her medications restarted-Will restart Nucynta, Lyrica. Suspect  that her presentation is most consistent with a toxic encephalopathy-however she denies taking excessive medications-) she just "blacks out". EEG, MRI brain, telemetry, most recent echocardiogram all negative. Have consulted neurology for second opinion, have consulted psychiatry to see if we can minimize some of her psych medications-Geodon. Spoke with daughter over the phone. Patient keen to go home and refuses SNF.   [facility]

## 2014-11-14 NOTE — Consult Note (Addendum)
[name] Face-to-Face Psychiatry Consult   Reason for Consult:  Psych medication management Referring Physician:  Dr. Sloan Leiter Patient Identification: [name] MRN:  [id number] Principal Diagnosis: Post traumatic stress disorder (PTSD) Diagnosis:   Patient Active Problem List   Diagnosis Date Noted  . Acute encephalopathy [G93.40] 11/12/2014  . Leukocytosis [D72.829] 11/12/2014  . Altered mental status [R41.82] 11/12/2014  . Slurred speech [R47.81] 11/12/2014  . CKD (chronic kidney disease), stage III [N18.3]   . Diabetes mellitus without complication [U27.2]   . TIA (transient ischemic attack) [G45.9]   . Hypotension [I95.9] 10/28/2014  . Syncope [R55] 10/28/2014  . Anemia [D64.9] 10/28/2014  . Type II diabetes mellitus [E11.9] 10/28/2014  . OSA on CPAP [G47.33] 10/28/2014  . Chronic lower back pain [M54.5, G89.29] 10/28/2014  . Polyneuropathy in diabetes(357.2) [E11.42] 10/28/2014  . Diabetic polyneuropathy [E11.42]   . Fall [W19.XXXA] 08/14/2014  . Recurrent falls [R29.6] 08/14/2014  . Hot flashes [N95.1] 02/24/2014  . Arthralgia [M25.50] 02/24/2014  . Hair loss [L65.9] 02/24/2014  . Chest pain [R07.9] 01/06/2014  . Chronic back pain [M54.9, G89.29] 01/06/2014  . Chest discomfort [R07.89] 09/02/2013  . Shock [R57.9] 08/08/2013  . 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 Dysarthria [R47.1] 12/30/2012  . Benign paroxysmal positional vertigo [H81.10] 12/30/2012  . Breast cancer of upper-outer quadrant of right female breast [C50.411] 11/18/2012  . Hemorrhage of rectum and anus [K62.5] 09/28/2012  . Hx of radiation therapy [Z92.3]   . Breast cancer [C50.919] 02/13/2012  . Syncope and collapse [R55] 06/04/2011  . HSV [B00.9] 10/05/2008  . Asthma [J45.909] 06/02/2008  . HLD (hyperlipidemia) [E78.5] 06/02/2006  . Sinus tachycardia [I47.1] 05/25/2006  . Hypothyroidism [E03.9] 02/27/2006  . OBESITY [E66.9] 02/27/2006  . Depression [F32.9] 02/27/2006  . Essential  hypertension [I10] 02/27/2006  . GERD [K21.9] 02/27/2006  . KNEE PAIN [M25.569] 02/27/2006    Total Time spent with patient: 1 hour  Subjective:   [name] is a 59 y.o. female patient admitted with slurred speech.  HPI: [name] is a 59 y.o. female seen face-to-face for psychiatric consultation and evaluation of psychiatric medication management. Patient reportedly suffering with depression, anxiety and hallucinations over 20 years. Patient has been receiving outpatient medication management from psychiatry nurse practitioner, [facility]. Patient reports being compliant with her medication as prescribed and responded positively and asking not to change her medication during this visit. She has no apparent extrapyramidal symptoms during this visit. 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 Patient complained of forgetful like hard time remember her phone number but has no trouble remembering her daughter's name who is supportive to her. Patient presented with confusion and slurred speech which has improved since admitted to the hospital with her current medication regimen. Patient has a syncopal episodes several times in the past at least twice and her workup has been negative including MRI of the brain during those visits to hospital. Patient denies current symptoms of depression, anxiety, psychosis and suicidal/homicidal ideation, intention or plans. Patient BLA is not significnat. Patient urine drug screen is positive for benzodiazepine and narcotics.  Past Psychiatric History: She was diagnosed with psychosis and PTSD and receiving out patient medication management from psychiatric nurse practitioner at [facility].   Risk to Self: Is patient at  risk  for suicide?: No Risk to Others:   Prior Inpatient Therapy:   Prior Outpatient Therapy:    Past Medical History:  Past Medical History  Diagnosis Date  . Depression   . Thyroid disease   . GERD (gastroesophageal reflux disease)   . Pancreatitis     hx of  . Knee pain, bilateral   . Obesity   . HSV (herpes simplex virus) infection   . Hyperlipemia   . Asthma   . Tachycardia     with sx in 2008  . Breast mass in female     L breast 2008, US showed likely fat necrosis  . Anxiety   . Hypertension     sees Dr. Criss Rosales , [facility] [address]  . Hx of radiation therapy 05/05/12- 07/15/12    right breast, 45 gray x 25 fx, lumpectomy cavity boosted to 16.2 gray  . History of blood transfusion 2011    "after one of my OR's"  . Hypothyroidism   . 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 Polyneuropathy in diabetes(357.2)   . Benign paroxysmal positional vertigo 12/30/2012  . 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  . OSA on CPAP     pt does not know settings  . Pneumonia 1950's  . Anemia   . Type II diabetes mellitus   . History of hiatal hernia   . History of stomach ulcers   . Daily headache   . Migraines     "~ 3 times/month" (01/06/2014)  . Arthritis     "pretty much all my joints"  . Chronic lower back pain   . Schizophrenia   . Kidney stones   . CKD (chronic kidney disease), stage III     "lower stage" (01/06/2014)    Past Surgical History  Procedure Laterality Date  . Total knee arthroplasty Bilateral 2009-06/2009    left; right  . Foot fracture surgery Right 1990's  . Shoulder open rotator cuff repair Left 1990's  . Thyroidectomy  1970's  . Joint replacement    . Abdominal hysterectomy  1979?    partial  . Cholecystectomy  1980's?  . Colonoscopy with propofol N/A 09/28/2012    Procedure: COLONOSCOPY WITH PROPOFOL;  Surgeon: Lear Ng, MD;  Location: [facility];  Service: Endoscopy;  Laterality: N/A;  .  Revision total knee arthroplasty  07/2009  . Breast surgery Right 01/2012    "cancer"  . Breast lumpectomy  03/03/2012    Procedure: LUMPECTOMY;  Surgeon: Haywood Lasso, MD;  Location: [facility];  Service: General;  Laterality: Right;   Family History:  Family History  Problem Relation Age of Onset  . Heart disease Brother     Multiple MIs, starting in his 39s  . Diabetes Brother   . Hypertension Mother   . Diabetes Mother   . Breast cancer Mother 94  . Bone cancer Mother   . Hypertension Sister   . Diabetes Sister   . Breast cancer Sister 74  . Breast cancer Maternal Grandmother   . Heart disease Maternal Grandmother   . Uterine cancer Other 19  . Breast cancer Paternal Aunt 62  . Breast cancer Paternal Grandmother     dx in her 73s  . Prostate cancer Paternal Grandfather    Family Psychiatric  History: Not significant Social History:  History  Alcohol Use No    Comment: "stopped drinking in 1996; I was an alcoholic"  History  Drug Use No    Social History   Social History  . Marital Status: Divorced    Spouse Name: N/A  . Number of Children: N/A  . Years of Education: N/A   Occupational History  . Disabled    Social History Main Topics  . Smoking status: Former Smoker -- 1.00 packs/day for 7 years    Types: Cigarettes    Quit date: 03/24/1992  . Smokeless tobacco: Never Used  . Alcohol Use: No     Comment: "stopped drinking in 1996; I was an alcoholic"  . Drug Use: No  . Sexual Activity: No   Other Topics Concern  . None   Social History Narrative   Single. Disabled Biochemist, clinical.    Additional Social History:                          Allergies:   Allergies  Allergen Reactions  . Shellfish Allergy Shortness Of Breath  . Bee Venom Hives  . Morphine And Related Other (See Comments)    Overly sedated    Labs:  Results for orders placed or performed during the hospital encounter of 11/12/14 (from the past 48 hour(s))  Ethanol      Status: None   Collection Time: 11/12/14  7:35 PM  Result Value Ref Range   Alcohol, Ethyl (B) <5 <5 mg/dL    Comment:        LOWEST DETECTABLE LIMIT FOR SERUM ALCOHOL IS 5 mg/dL FOR MEDICAL PURPOSES ONLY   Protime-INR     Status: None   Collection Time: 11/12/14  7:35 PM  Result Value Ref Range   Prothrombin Time 13.9 11.6 - 15.2 seconds   INR 1.05 0.00 - 1.49  APTT     Status: None   Collection Time: 11/12/14  7:35 PM  Result Value Ref Range   aPTT 27 24 - 37 seconds  CBC     Status: Abnormal   Collection Time: 11/12/14  7:35 PM  Result Value Ref Range   WBC 17.5 (H) 4.0 - 10.5 K/uL   RBC 3.99 3.87 - 5.11 MIL/uL   Hemoglobin 11.0 (L) 12.0 - 15.0 g/dL   HCT 34.9 (L) 36.0 - 46.0 %   MCV 87.5 78.0 - 100.0 fL   MCH 27.6 26.0 - 34.0 pg   MCHC 31.5 30.0 - 36.0 g/dL   RDW 18.6 (H) 11.5 - 15.5 %   Platelets 297 150 - 400 K/uL  Differential     Status: Abnormal   Collection Time: 11/12/14  7:35 PM  Result Value Ref Range   Neutrophils Relative % 80 %   Neutro Abs 14.0 (H) 1.7 - 7.7 K/uL   Lymphocytes Relative 15 %   Lymphs Abs 2.5 0.7 - 4.0 K/uL   Monocytes Relative 4 %   Monocytes Absolute 0.7 0.1 - 1.0 K/uL   Eosinophils Relative 1 %   Eosinophils Absolute 0.2 0.0 - 0.7 K/uL   Basophils Relative 0 %   Basophils Absolute 0.0 0.0 - 0.1 K/uL  Comprehensive metabolic panel     Status: Abnormal   Collection Time: 11/12/14  7:35 PM  Result Value Ref Range   Sodium 137 135 - 145 mmol/L   Potassium 4.4 3.5 - 5.1 mmol/L   Chloride 104 101 - 111 mmol/L   CO2 22 22 - 32 mmol/L   Glucose, Bld 110 (H) 65 - 99 mg/dL   BUN 26 (H) 6 -  20 mg/dL   Creatinine, Ser 1.41 (H) 0.44 - 1.00 mg/dL   Calcium 9.4 8.9 - 10.3 mg/dL   Total Protein 6.7 6.5 - 8.1 g/dL   Albumin 3.6 3.5 - 5.0 g/dL   AST 19 15 - 41 U/L   ALT 21 14 - 54 U/L   Alkaline Phosphatase 76 38 - 126 U/L   Total Bilirubin 0.2 (L) 0.3 - 1.2 mg/dL   GFR calc non Af Amer 40 (L) >60 mL/min   GFR calc Af Amer 47 (L) >60  mL/min    Comment: (NOTE) The eGFR has been calculated using the CKD EPI equation. This calculation has not been validated in all clinical situations. eGFR's persistently <60 mL/min signify possible Chronic Kidney Disease.    Anion gap 11 5 - 15  I-stat troponin, ED (not at [facility], [facility])     Status: None   Collection Time: 11/12/14  7:38 PM  Result Value Ref Range   Troponin i, poc 0.00 0.00 - 0.08 ng/mL   Comment 3            Comment: Due to the release kinetics of cTnI, a negative result within the first hours of the onset of symptoms does not rule out myocardial infarction with certainty. If myocardial infarction is still suspected, repeat the test at appropriate intervals.   I-Stat Chem 8, ED  (not at [facility], [facility])     Status: Abnormal   Collection Time: 11/12/14  7:40 PM  Result Value Ref Range   Sodium 138 135 - 145 mmol/L   Potassium 4.3 3.5 - 5.1 mmol/L   Chloride 103 101 - 111 mmol/L   BUN 29 (H) 6 - 20 mg/dL   Creatinine, Ser 1.40 (H) 0.44 - 1.00 mg/dL   Glucose, Bld 110 (H) 65 - 99 mg/dL   Calcium, Ion 1.16 1.12 - 1.23 mmol/L   TCO2 23 0 - 100 mmol/L   Hemoglobin 12.9 12.0 - 15.0 g/dL   HCT 38.0 36.0 - 46.0 %  Urine rapid drug screen (hosp performed)not at [facility]     Status: Abnormal   Collection Time: 11/12/14  8:57 PM  Result Value Ref Range   Opiates POSITIVE (A) NONE DETECTED   Cocaine NONE DETECTED NONE DETECTED   Benzodiazepines POSITIVE (A) NONE DETECTED   Amphetamines NONE DETECTED NONE DETECTED   Tetrahydrocannabinol NONE DETECTED NONE DETECTED   Barbiturates NONE DETECTED NONE DETECTED    Comment:        DRUG SCREEN FOR MEDICAL PURPOSES ONLY.  IF CONFIRMATION IS NEEDED FOR ANY PURPOSE, NOTIFY LAB WITHIN 5 DAYS.        LOWEST DETECTABLE LIMITS FOR URINE DRUG SCREEN Drug Class       Cutoff (ng/mL) Amphetamine      1000 Barbiturate      200 Benzodiazepine   173 Tricyclics       567 Opiates          300 Cocaine          300 THC              50    Urinalysis, Routine w reflex microscopic (not at [facility])     Status: Abnormal   Collection Time: 11/12/14  8:57 PM  Result Value Ref Range   Color, Urine YELLOW YELLOW   APPearance CLEAR CLEAR   Specific Gravity, Urine 1.026 1.005 - 1.030   pH 5.0 5.0 - 8.0   Glucose, UA NEGATIVE NEGATIVE mg/dL   Hgb urine  dipstick TRACE (A) NEGATIVE   Bilirubin Urine SMALL (A) NEGATIVE   Ketones, ur NEGATIVE NEGATIVE mg/dL   Protein, ur 30 (A) NEGATIVE mg/dL   Urobilinogen, UA 0.2 0.0 - 1.0 mg/dL   Nitrite NEGATIVE NEGATIVE   Leukocytes, UA NEGATIVE NEGATIVE  Urine microscopic-add on     Status: None   Collection Time: 11/12/14  8:57 PM  Result Value Ref Range   Squamous Epithelial / LPF RARE RARE   WBC, UA 0-2 <3 WBC/hpf   RBC / HPF 0-2 <3 RBC/hpf   Bacteria, UA RARE RARE  I-Stat arterial blood gas, ED     Status: Abnormal   Collection Time: 11/12/14  9:39 PM  Result Value Ref Range   pH, Arterial 7.370 7.350 - 7.450   pCO2 arterial 45.1 (H) 35.0 - 45.0 mmHg   pO2, Arterial 61.0 (L) 80.0 - 100.0 mmHg   Bicarbonate 26.0 (H) 20.0 - 24.0 mEq/L   TCO2 27 0 - 100 mmol/L   O2 Saturation 90.0 %   Patient temperature 98.6 F    Collection site RADIAL, ALLEN'S TEST ACCEPTABLE    Drawn by Operator    Sample type ARTERIAL   Culture, blood (routine x 2)     Status: None (Preliminary result)   Collection Time: 11/12/14 10:25 PM  Result Value Ref Range   Specimen Description BLOOD LEFT HAND    Special Requests BOTTLES DRAWN AEROBIC ONLY 3CC    Culture NO GROWTH 2 DAYS    Report Status PENDING   Ammonia     Status: None   Collection Time: 11/12/14 11:00 PM  Result Value Ref Range   Ammonia 14 9 - 35 umol/L  I-Stat arterial blood gas, ED     Status: Abnormal   Collection Time: 11/12/14 11:10 PM  Result Value Ref Range   pH, Arterial 7.410 7.350 - 7.450   pCO2 arterial 42.5 35.0 - 45.0 mmHg   pO2, Arterial 72.0 (L) 80.0 - 100.0 mmHg   Bicarbonate 26.9 (H) 20.0 - 24.0 mEq/L   TCO2 28 0 - 100  mmol/L   O2 Saturation 94.0 %   Acid-Base Excess 2.0 0.0 - 2.0 mmol/L   Patient temperature 98.6 F    Collection site RADIAL, ALLEN'S TEST ACCEPTABLE    Drawn by Operator    Sample type ARTERIAL   Glucose, capillary     Status: Abnormal   Collection Time: 11/13/14  1:09 AM  Result Value Ref Range   Glucose-Capillary 101 (H) 65 - 99 mg/dL  Culture, blood (routine x 2)     Status: None (Preliminary result)   Collection Time: 11/13/14  1:45 AM  Result Value Ref Range   Specimen Description BLOOD LEFT HAND    Special Requests IN PEDIATRIC BOTTLE 1CC    Culture NO GROWTH 1 DAY    Report Status PENDING   MRSA PCR Screening     Status: None   Collection Time: 11/13/14  3:52 AM  Result Value Ref Range   MRSA by PCR NEGATIVE NEGATIVE    Comment:        The GeneXpert MRSA Assay (FDA approved for NASAL specimens only), is one component of a comprehensive MRSA colonization surveillance program. It is not intended to diagnose MRSA infection nor to guide or monitor treatment for MRSA infections.   Basic metabolic panel     Status: Abnormal   Collection Time: 11/13/14  4:08 AM  Result Value Ref Range   Sodium 140 135 - 145 mmol/L  Potassium 3.9 3.5 - 5.1 mmol/L   Chloride 107 101 - 111 mmol/L   CO2 26 22 - 32 mmol/L   Glucose, Bld 108 (H) 65 - 99 mg/dL   BUN 20 6 - 20 mg/dL   Creatinine, Ser 1.06 (H) 0.44 - 1.00 mg/dL   Calcium 8.9 8.9 - 10.3 mg/dL   GFR calc non Af Amer 57 (L) >60 mL/min   GFR calc Af Amer >60 >60 mL/min    Comment: (NOTE) The eGFR has been calculated using the CKD EPI equation. This calculation has not been validated in all clinical situations. eGFR's persistently <60 mL/min signify possible Chronic Kidney Disease.    Anion gap 7 5 - 15  CBC     Status: Abnormal   Collection Time: 11/13/14  4:08 AM  Result Value Ref Range   WBC 13.5 (H) 4.0 - 10.5 K/uL   RBC 3.31 (L) 3.87 - 5.11 MIL/uL   Hemoglobin 9.1 (L) 12.0 - 15.0 g/dL    Comment: REPEATED TO VERIFY    HCT 28.6 (L) 36.0 - 46.0 %   MCV 86.4 78.0 - 100.0 fL   MCH 27.5 26.0 - 34.0 pg   MCHC 31.8 30.0 - 36.0 g/dL   RDW 18.6 (H) 11.5 - 15.5 %   Platelets 237 150 - 400 K/uL  Glucose, capillary     Status: None   Collection Time: 11/13/14  7:30 AM  Result Value Ref Range   Glucose-Capillary 94 65 - 99 mg/dL   Comment 1 Notify RN    Comment 2 Document in Chart   Glucose, capillary     Status: Abnormal   Collection Time: 11/13/14  5:43 PM  Result Value Ref Range   Glucose-Capillary 124 (H) 65 - 99 mg/dL   Comment 1 Notify RN    Comment 2 Document in Chart   Glucose, capillary     Status: Abnormal   Collection Time: 11/13/14  8:01 PM  Result Value Ref Range   Glucose-Capillary 126 (H) 65 - 99 mg/dL  Glucose, capillary     Status: None   Collection Time: 11/13/14  9:46 PM  Result Value Ref Range   Glucose-Capillary 94 65 - 99 mg/dL   Comment 1 Notify RN    Comment 2 Document in Chart   CBC     Status: Abnormal   Collection Time: 11/14/14  5:46 AM  Result Value Ref Range   WBC 9.3 4.0 - 10.5 K/uL   RBC 3.26 (L) 3.87 - 5.11 MIL/uL   Hemoglobin 9.2 (L) 12.0 - 15.0 g/dL   HCT 28.0 (L) 36.0 - 46.0 %   MCV 85.9 78.0 - 100.0 fL   MCH 28.2 26.0 - 34.0 pg   MCHC 32.9 30.0 - 36.0 g/dL   RDW 18.8 (H) 11.5 - 15.5 %   Platelets 219 150 - 400 K/uL  Basic metabolic panel     Status: Abnormal   Collection Time: 11/14/14  5:46 AM  Result Value Ref Range   Sodium 143 135 - 145 mmol/L   Potassium 3.9 3.5 - 5.1 mmol/L   Chloride 111 101 - 111 mmol/L   CO2 27 22 - 32 mmol/L   Glucose, Bld 109 (H) 65 - 99 mg/dL   BUN 19 6 - 20 mg/dL   Creatinine, Ser 1.11 (H) 0.44 - 1.00 mg/dL   Calcium 8.9 8.9 - 10.3 mg/dL   GFR calc non Af Amer 54 (L) >60 mL/min   GFR calc Af Amer >60 >  60 mL/min    Comment: (NOTE) The eGFR has been calculated using the CKD EPI equation. This calculation has not been validated in all clinical situations. eGFR's persistently <60 mL/min signify possible Chronic  Kidney Disease.    Anion gap 5 5 - 15  Glucose, capillary     Status: Abnormal   Collection Time: 11/14/14  6:35 AM  Result Value Ref Range   Glucose-Capillary 110 (H) 65 - 99 mg/dL  Glucose, capillary     Status: Abnormal   Collection Time: 11/14/14 11:03 AM  Result Value Ref Range   Glucose-Capillary 111 (H) 65 - 99 mg/dL  Glucose, capillary     Status: None   Collection Time: 11/14/14  4:15 PM  Result Value Ref Range   Glucose-Capillary 94 65 - 99 mg/dL    Current Facility-Administered Medications  Medication Dose Route Frequency Provider Last Rate Last Dose  . acetaminophen (TYLENOL) tablet 650 mg  650 mg Oral Q6H PRN Ivor Costa, MD      . albuterol (PROVENTIL) (2.5 MG/3ML) 0.083% nebulizer solution 2.5 mg  2.5 mg Nebulization Q4H PRN Ivor Costa, MD      . aspirin EC tablet 81 mg  81 mg Oral QHS Ivor Costa, MD   81 mg at 11/13/14 2037  . cholecalciferol (VITAMIN D) tablet 1,000 Units  1,000 Units Oral Daily Ivor Costa, MD   1,000 Units at 11/14/14 1046  . diclofenac sodium (VOLTAREN) 1 % transdermal gel 2 g  2 g Topical BID PRN Ivor Costa, MD      . esomeprazole (NEXIUM) capsule 40 mg  40 mg Oral Q1200 Jonetta Osgood, MD   40 mg at 11/14/14 1629  . ferrous sulfate tablet 325 mg  325 mg Oral Q breakfast Ivor Costa, MD   325 mg at 11/14/14 4801  . heparin injection 5,000 Units  5,000 Units Subcutaneous 3 times per day Ivor Costa, MD   5,000 Units at 11/14/14 1505  . hydrALAZINE (APRESOLINE) injection 5 mg  5 mg Intravenous Q2H PRN Ivor Costa, MD      . insulin aspart (novoLOG) injection 0-9 Units  0-9 Units Subcutaneous TID WC Ivor Costa, MD   1 Units at 11/13/14 1750  . insulin detemir (LEVEMIR) injection 5 Units  5 Units Subcutaneous QHS Ivor Costa, MD   5 Units at 11/13/14 2037  . levothyroxine (SYNTHROID, LEVOTHROID) tablet 150 mcg  150 mcg Oral QAC breakfast Ivor Costa, MD   150 mcg at 11/14/14 [phone]  . multivitamin with minerals tablet 1 tablet  1 tablet Oral Daily Ivor Costa, MD   1  tablet at 11/14/14 1045  . ondansetron (ZOFRAN) tablet 4 mg  4 mg Oral Q6H PRN Ivor Costa, MD       Or  . ondansetron [facility]) injection 4 mg  4 mg Intravenous Q6H PRN Ivor Costa, MD      . oxyCODONE (Oxy IR/ROXICODONE) immediate release tablet 15 mg  15 mg Oral Q6H PRN Jonetta Osgood, MD   15 mg at 11/14/14 1238  . pregabalin (LYRICA) capsule 150 mg  150 mg Oral BID Jonetta Osgood, MD   150 mg at 11/14/14 1045  . rosuvastatin (CRESTOR) tablet 20 mg  20 mg Oral Daily Ivor Costa, MD   20 mg at 11/14/14 1046  . sodium chloride 0.9 % injection 3 mL  3 mL Intravenous Q12H Ivor Costa, MD   3 mL at 11/14/14 1052  . tapentadol (NUCYNTA) tablet 200 mg  200 mg  Oral Q12H Jonetta Osgood, MD   200 mg at 11/14/14 1045  . valACYclovir (VALTREX) tablet 1,000 mg  1,000 mg Oral BID Ivor Costa, MD   1,000 mg at 11/14/14 1045  . ziprasidone (GEODON) capsule 20 mg  20 mg Oral QPM Ivor Costa, MD   20 mg at 11/13/14 1809    Musculoskeletal: Strength & Muscle Tone: within normal limits Gait & Station: unable to stand Patient leans: N/A  Psychiatric Specialty Exam: ROS patient denied nausea, vomiting, abdominal pain, headache, chest pain, and shortness of breath No Fever-chills, No Headache, No changes with Vision or hearing, reports vertigo No problems swallowing food or Liquids, No Chest pain, Cough or Shortness of Breath, No Abdominal pain, No Nausea or Vommitting, Bowel movements are regular, No Blood in stool or Urine, No dysuria, No new skin rashes or bruises, No new joints pains-aches,  No new weakness, tingling, numbness in any extremity, No recent weight gain or loss, No polyuria, polydypsia or polyphagia,   A full 10 point Review of Systems was done, except as stated above, all other Review of Systems were negative.  Blood pressure 135/72, pulse 67, temperature 98.6 F (37 C), temperature source Oral, resp. rate 20, height _0  (1.651 m), weight 136.3 kg (300 lb 7.8 oz), SpO2 99 %.Body  mass index is 50 kg/(m^2).  General Appearance: Casual  Eye Contact::  Good  Speech:  Clear and Coherent  Volume:  Normal  Mood:  Euthymic  Affect:  Appropriate and Congruent  Thought Process:  Intact  Orientation:  Full (Time, Place, and Person)  Thought Content:  WDL  Suicidal Thoughts:  No  Homicidal Thoughts:  No  Memory:  Immediate;   Fair Recent;   Fair  Judgement:  Intact  Insight:  Good  Psychomotor Activity:  Normal  Concentration:  Good  Recall:  Good  Fund of Knowledge:Good  Language: Good  Akathisia:  Negative  Handed:  Right  AIMS (if indicated):     Assets:  Communication Skills Desire for Improvement Financial Resources/Insurance Housing Leisure Time Resilience Social Support Transportation  ADL's:  Intact  Cognition: WNL  Sleep:      Treatment Plan Summary: Daily contact with patient to assess and evaluate symptoms and progress in treatment and Medication management  Disposition: Patient has no safety concerns Continue current medication without changes Continue Geodon 20 mg Qpm for psychosis - QT/QTC within normal range Refer to the outpatient psychiatric services to neuropsychiatry when medically stable Patient does not meet criteria for psychiatric inpatient admission. Supportive therapy provided about ongoing stressors. Appreciate psychiatric consultation and we sign off at this time Please contact [phone] or [phone] if needs further assistance   [name],JANARDHAHA R. 11/14/2014 5:33 PM

## 2014-11-15 DIAGNOSIS — F4310 Post-traumatic stress disorder, unspecified: Secondary | ICD-10-CM | POA: Diagnosis not present

## 2014-11-15 DIAGNOSIS — K219 Gastro-esophageal reflux disease without esophagitis: Secondary | ICD-10-CM | POA: Diagnosis not present

## 2014-11-15 DIAGNOSIS — G9340 Encephalopathy, unspecified: Secondary | ICD-10-CM | POA: Diagnosis not present

## 2014-11-15 DIAGNOSIS — M545 Low back pain: Secondary | ICD-10-CM | POA: Diagnosis not present

## 2014-11-15 LAB — GLUCOSE, CAPILLARY
GLUCOSE-CAPILLARY: 104 mg/dL — AB (ref 65–99)
GLUCOSE-CAPILLARY: 148 mg/dL — AB (ref 65–99)

## 2014-11-15 MED ORDER — PREGABALIN 150 MG PO CAPS
150.0000 mg | ORAL_CAPSULE | Freq: Two times a day (BID) | ORAL | Status: DC
Start: 2014-11-15 — End: 2015-06-25

## 2014-11-15 MED ORDER — LORAZEPAM 2 MG PO TABS: 2.0000 mg | ORAL_TABLET | Freq: Two times a day (BID) | ORAL | Status: DC | PRN

## 2014-11-15 MED ORDER — SENNOSIDES-DOCUSATE SODIUM 8.6-50 MG PO TABS: 2.0000 | ORAL_TABLET | Freq: Every day | ORAL | Status: DC

## 2014-11-15 MED ORDER — ONDANSETRON HCL 4 MG PO TABS: 4.0000 mg | ORAL_TABLET | Freq: Three times a day (TID) | ORAL | Status: DC | PRN

## 2014-11-15 MED ORDER — TAPENTADOL HCL ER 200 MG PO TB12: 200.0000 mg | ORAL_TABLET | Freq: Two times a day (BID) | ORAL | Status: DC

## 2014-11-15 MED ORDER — POLYETHYLENE GLYCOL 3350 17 G PO PACK: 17.0000 g | PACK | Freq: Every day | ORAL | Status: DC

## 2014-11-15 MED ORDER — NUCYNTA ER 200 MG PO TB12: 200.0000 mg | ORAL_TABLET | Freq: Two times a day (BID) | ORAL | Status: DC

## 2014-11-15 MED ORDER — OXYCODONE HCL 15 MG PO TABS: 15.0000 mg | ORAL_TABLET | Freq: Four times a day (QID) | ORAL | Status: DC | PRN

## 2014-11-15 NOTE — Progress Notes (Signed)
Attempted to call [facility] for report several times, no answers. [facility] is here to transport patient. All belongings sent with patient. Medication from pyxis removed and picked up from pharmacy. Verified with Aldona Bar, RN.   Ave Filter, RN

## 2014-11-15 NOTE — Clinical Social Work Placement (Signed)
   CLINICAL SOCIAL WORK PLACEMENT  NOTE  Date:  11/15/2014  Patient Details  Name: [name] MRN: [id number] Date of Birth: 09-May-1955  Clinical Social Work is seeking post-discharge placement for this patient at the [place] level of care (*CSW will initial, date and re-position this form in  chart as items are completed):  Yes   Patient/family provided with [place] Work Department's list of facilities offering this level of care within the geographic area requested by the patient (or if unable, by the patient's family).  Yes   Patient/family informed of their freedom to choose among providers that offer the needed level of care, that participate in Medicare, Medicaid or managed care program needed by the patient, have an available bed and are willing to accept the patient.  Yes   Patient/family informed of [facility]'s ownership interest in [facility] and [facility], as well as of the fact that they are under no obligation to receive care at these facilities.  PASRR submitted to EDS on       PASRR number received on       Existing PASRR number confirmed on 11/15/14     FL2 transmitted to all facilities in geographic area requested by pt/family on 11/15/14     FL2 transmitted to all facilities within larger geographic area on       Patient informed that his/her managed care company has contracts with or will negotiate with certain facilities, including the following:        Yes   Patient/family informed of bed offers received.  Patient chooses bed at [facility]     Physician recommends and patient chooses bed at      Patient to be transferred to [place] on 11/15/14.  Patient to be transferred to facility by [facility]      Patient family notified on 11/15/14 of transfer.  Name of family member notified:  pt will notify her family      PHYSICIAN       Additional Comment:     _______________________________________________ Greta Doom, LCSW 11/15/2014, 1:18 PM

## 2014-11-15 NOTE — Discharge Summary (Signed)
Physician Discharge Summary  JATARA HUETTNER MWU:[id number] DOB: 1955/04/25 DOA: 11/12/2014  PCP: [name], MD  Admit date: 11/12/2014 Discharge date: 11/15/2014  Time spent: 30 minutes  Recommendations for Outpatient Follow-up:  1. Please follow-up with Dr. [name] in one week after discharge from SNF. At this time, please discuss current medications to see if any adjustments need to be made. Please repeat BMP to monitor Cr and closely monitor blood sugar upon restarting Janumet. 2. Please follow-up with an outpatient psychiatrist at your earliest convenience to discuss your current medications. 3. Please  Minimize polypharmacy-and start tapering some of her narcotics, benzo's etc. 4. Consider ambulatory EEG as outpatient  Discharge Diagnoses:  Principal Problem: Acute Encephalopathy Active Problems:   Hypothyroidism   HLD (hyperlipidemia)   Depression   Essential hypertension   Asthma   GERD   KNEE PAIN   Secondary parkinsonism   Dysarthria   Recurrent falls   Type II diabetes mellitus   OSA on CPAP   Chronic lower back pain   Diabetic polyneuropathy   TIA (transient ischemic attack)   Acute encephalopathy   Leukocytosis   Altered mental status   CKD (chronic kidney disease), stage III   Slurred speech  Discharge Condition: Stable  Diet recommendation: Heart Healthy Diet  Filed Weights   11/12/14 2016 11/13/14 0042 11/13/14 2115  Weight: 131.997 kg (291 lb) 139 kg (306 lb 7 oz) 136.3 kg (300 lb 7.8 oz)    History of present illness: [name] is a 59 y.o. female with PMH of HTN, HLD, GERD, depression, chronic back pain, neuropathy, asthma, TIA, DM2, Hypothyroid, CKD-III, OSA, previous Breast Ca (S/P Lumpectomy and Radiation Rx), who presented with altered mental status and possible dysarthria.   Hospital Course by problem list:  Principal Problem: Acute encephalopathy: Improved, patient awake and alert this morning at time of discharge. Suspect toxic  encephalopathy from polypharmacy. Restarted narcotics yesterday as confusion and sedation had resolved. Neurology was consulted to further investigate, EEG showed no signs of seizure activity and confirmed slowed cerebral activity indicating toxic/metabolic etiologies. Recommendations are to consider outpatient ambulatory EEG if these episodes continue. Psych consult was placed and medications were reviewed, recommendation was to follow up as outpatient to review and adjust medication list. PT recommended SNF placement at this time to acquire further PT and supervision while medications are being adjusted.   Since patient has been on these medications for long time-cannot abruptly discontinue her narcotics and benzodiazepine's-some of these medications and will need to be slowly tapered off. This is deferred to outpatient physicians.  Active Problems: Hypothyroidism: Given Synthroid, stable throughout course.   HLD (hyperlipidemia): Given crestor, stable throughout course.   Depression/schizophrenia: Geodon and effexor initially held due to sedation and potential OD of unknown prescription medication, but was given soon after admission once patient was more awake and alert to avoid any withdrawal symptoms. Psychiatry was consulted for medication management and recommended no changes to her current regimen during this admission, but to follow up as outpatient at earliest convenience to review medication list given suspicion of polypharmacy leading to toxic encephalopathy.    Essential hypertension: Well controlled throughout hospital stay, not requiring hypertensive medications. Closely monitor BPs as outpatient.   Type 2 diabetes: Janumet was held upon admission, and patient's CBGs remained stable with Levemir 5 units and SSI without issues. Janumet will be resumed upon discharge, closely monitor blood sugar readings as outpatient.   CKD stage III: Creatinine was elevated to 1.4 upon admission (  usual  baseline around 1.1), likely prerenal due to dehydration. Improved with repletion back to usual baseline upon discharge.   Chronic back pain/diabetic polyneuropathy: Home narcotics and neuropathic pain medications were held initially upon admission due to severe sedation, but were restarted to avoid any withdrawal symptoms. Pain is well controlled upon discharge back on home regimen. Neurology was consulted and recommended hold of neurontin and to increase lyrica to BID. The recommendation is for outpatient follow up to review these medications and adjust as needed.   Asthma: Breathing remained stable throughout hospitalization, continue albuterol on an as needed basis for any wheezing or shortness of breath that  OSA: Continue CPAP machine was utilized at night without issue.   GERD: Home PPI continued without issues.   Procedures:  EEG   Consultations:  Neurology, Psychiatry  Discharge Exam: Filed Vitals:   11/15/14 1021  BP: 122/75  Pulse: 97  Temp: 98.7 F (37.1 C)  Resp: 20    General:  NAD, awake and alert  Neck: Supple, no LAD  Cardiovascular: RRR, no m/r/g  Respiratory: CTAB, unlabored  Abdomen: Soft, non tender, non distended, +BS  Musculoskeletal: Full ROM in all 4, strength equal and appropriate b/l  Neuro: Non focal, speech clear and appropriate thought content  Psych: Depressed mood, flat affect   Discharge Instructions  Discharge Instructions    Call MD for:  difficulty breathing, headache or visual disturbances    Complete by:  As directed      Call MD for:  extreme fatigue    Complete by:  As directed      Call MD for:  persistant dizziness or light-headedness    Complete by:  As directed      Diet - low sodium heart healthy    Complete by:  As directed      Discharge instructions    Complete by:  As directed   Please follow up with PCP and outpatient Psychiatry in one week.     Discharge to SNF when bed available    Complete by:  As directed       Increase activity slowly    Complete by:  As directed           Current Discharge Medication List    START taking these medications   Details  ondansetron (ZOFRAN) 4 MG tablet Take 1 tablet (4 mg total) by mouth every 8 (eight) hours as needed for nausea or vomiting.    polyethylene glycol (MIRALAX) packet Take 17 g by mouth daily. Qty: 14 each, Refills: 0    senna-docusate (SENOKOT-S) 8.6-50 MG tablet Take 2 tablets by mouth at bedtime.      CONTINUE these medications which have CHANGED   Details  LORazepam (ATIVAN) 2 MG tablet Take 1 tablet (2 mg total) by mouth every 12 (twelve) hours as needed for anxiety. Qty: 20 tablet, Refills: 0    NUCYNTA ER 200 MG TB12 Take 200 mg by mouth every 12 (twelve) hours. Qty: 15 tablet, Refills: 0    oxyCODONE (ROXICODONE) 15 MG immediate release tablet Take 1 tablet (15 mg total) by mouth every 6 (six) hours as needed for pain. Qty: 30 tablet, Refills: 0    pregabalin (LYRICA) 150 MG capsule Take 1 capsule (150 mg total) by mouth 2 (two) times daily.      CONTINUE these medications which have NOT CHANGED   Details  aspirin EC 81 MG tablet Take 1 tablet (81 mg total) by mouth daily. Qty: 30  tablet, Refills: 0    cholecalciferol (VITAMIN D) 1000 UNITS tablet Take 1 tablet (1,000 Units total) by mouth daily. Qty: 30 tablet, Refills: 12    diclofenac sodium (VOLTAREN) 1 % GEL Apply 2 g topically 2 (two) times daily as needed (pain).     esomeprazole (NEXIUM) 40 MG capsule Take 1 capsule (40 mg total) by mouth daily before breakfast. Qty: 30 capsule, Refills: 1    ferrous sulfate 325 (65 FE) MG EC tablet Take 325 mg by mouth daily with breakfast.    insulin aspart (NOVOLOG) 100 UNIT/ML injection Inject 0-6 Units into the skin 3 (three) times daily as needed for high blood sugar (200-299 = 4 units, 300 and above = 6 units). Sliding scale    insulin detemir (LEVEMIR) 100 UNIT/ML injection Inject 10 Units into the skin at bedtime.     Iron-Vitamins (GERITOL COMPLETE) TABS Take 1 tablet by mouth daily.    levothyroxine (SYNTHROID, LEVOTHROID) 150 MCG tablet Take 150 mcg by mouth daily before breakfast.    meloxicam (MOBIC) 15 MG tablet Take 15 mg by mouth daily.     rosuvastatin (CRESTOR) 20 MG tablet Take 20 mg by mouth daily.    sitaGLIPtin-metformin (JANUMET) 50-500 MG per tablet Take 1 tablet by mouth 2 (two) times daily with a meal.    valACYclovir (VALTREX) 1000 MG tablet Take 1,000 mg by mouth 2 (two) times daily.     Venlafaxine HCl 150 MG TB24 Take 150 mg by mouth daily.    ziprasidone (GEODON) 20 MG capsule Take 20 mg by mouth every evening.      STOP taking these medications     gabapentin (NEURONTIN) 100 MG capsule        Allergies  Allergen Reactions  . Shellfish Allergy Shortness Of Breath  . Bee Venom Hives  . Morphine And Related Other (See Comments)    Overly sedated   Follow-up Information    Follow up with SANDERS,ROBYN N, MD. Schedule an appointment as soon as possible for a visit in 1 week.   Specialty:  Internal Medicine   Contact information:   [address] [place] [address] [phone]      The results of significant diagnostics from this hospitalization (including imaging, microbiology, ancillary and laboratory) are listed below for reference.    Significant Diagnostic Studies: Ct Head Wo Contrast  11/12/2014   CLINICAL DATA:  Altered mental status.  Lethargic.  EXAM: CT HEAD WITHOUT CONTRAST  TECHNIQUE: Contiguous axial images were obtained from the base of the skull through the vertex without intravenous contrast.  COMPARISON:  Head MRI 10/29/2014 and CT 10/28/2014  FINDINGS: A mildly expanded partially empty sella is again noted. There is no evidence of acute cortical infarct, intracranial hemorrhage, mass, midline shift, or extra-axial fluid collection. Ventricles and sulci are normal for age.  Bilateral exophthalmos is unchanged. Cerumen/ debris is  noted in both external auditory canals. The mastoid air cells are clear. Small amount of left sphenoid sinus fluid and focal mucosal thickening or mucous retention cyst in the right sphenoid sinus are similar to the prior MRI.  IMPRESSION: No evidence of acute intracranial abnormality.   Electronically Signed   By: Logan Bores M.D.   On: 11/12/2014 20:27   Ct Head Wo Contrast  10/28/2014   CLINICAL DATA:  Syncope  EXAM: CT HEAD WITHOUT CONTRAST  TECHNIQUE: Contiguous axial images were obtained from the base of the skull through the vertex without intravenous contrast.  COMPARISON:  04/22/2012  FINDINGS: No mass effect, midline shift, or acute hemorrhage. Sella turcica remain somewhat expanded and fluid-filled. Cerumen fills the external auditory canals. No fracture. Mastoid air cells are clear. Visualized paranasal sinuses are clear. Intact cranium.  IMPRESSION: No acute intracranial pathology.   Electronically Signed   By: Marybelle Killings M.D.   On: 10/28/2014 09:20   Mr Brain Wo Contrast  11/13/2014   CLINICAL DATA:  Initial evaluation for acute slurred speech.  EXAM: MRI HEAD WITHOUT CONTRAST  TECHNIQUE: Multiplanar, multiecho pulse sequences of the brain and surrounding structures were obtained without intravenous contrast.  COMPARISON:  Prior CT from earlier the same day as well as previous MRI from 10/29/2014.  FINDINGS: The CSF containing spaces are within normal limits for patient age. No focal parenchymal signal abnormality is identified. No mass lesion, midline shift, or extra-axial fluid collection. Ventricles are normal in size without evidence of hydrocephalus.  No diffusion-weighted signal abnormality is identified to suggest acute intracranial infarct. Gray-white matter differentiation is maintained. Normal flow voids are seen within the intracranial vasculature. No intracranial hemorrhage identified.  The cervicomedullary junction is normal. Incidental note made of an empty sella. Bilateral  exophthalmos again noted. No acute abnormality about the globes and orbits.  The bone marrow signal intensity is normal. Calvarium is intact. Visualized upper cervical spine is within normal limits.  Scalp soft tissues are unremarkable.  Small amount layering opacity within the left sphenoid sinus. Paranasal sinuses are otherwise clear. Small left mastoid effusion.  IMPRESSION: 1. No acute intracranial infarct or other process identified. 2. Stable bilateral exophthalmos. No retrobulbar process. Findings could be related to underlying thyroid disease. Correlation with laboratory values recommended. 3. Otherwise normal brain MRI.   Electronically Signed   By: Jeannine Boga M.D.   On: 11/13/2014 00:45   Mr Brain Wo Contrast  10/29/2014   CLINICAL DATA:  TIA. Episodes of dizziness lightheadedness or last 2 weeks. Right-sided headaches 2 weeks. Syncopal episode.  EXAM: MRI HEAD WITHOUT CONTRAST  TECHNIQUE: Multiplanar, multiecho pulse sequences of the brain and surrounding structures were obtained without intravenous contrast.  COMPARISON:  CT head without contrast 10/28/2014. MRI brain 06/05/2011.  FINDINGS: A relatively empty sella is again noted. No acute infarct, hemorrhage, or mass lesion is present. The ventricles are of normal size. Insert pass fluid  The basal ganglia and brainstem are within normal limits. Flow is present in the major intracranial arteries. Exophthalmos is again seen bilaterally. There is straightening of the optic nerves. No mass lesion is present. Minimal mucosal thickening is present in the left maxillary sinus, left sphenoid sinus, and anterior left ethmoid air cells. There is some fluid in left mastoid air cells as well. No obstructing nasopharyngeal lesion is present.  Midline structures are unremarkable.  IMPRESSION: 1. No acute or focal lesion to explain the patient's symptoms. 2. Normal MRI appearance brain. 3. Bilateral exophthalmos is again noted. No focal retrobulbar mass  lesion is present. This could be related to thyroid disease. Laboratory results suggests the patient is euthyroid.   Electronically Signed   By: San Morelle M.D.   On: 10/29/2014 10:55   Dg Chest Port 1 View  11/12/2014   CLINICAL DATA:  Acute encephalopathy.  EXAM: PORTABLE CHEST 1 VIEW  COMPARISON:  10/27/2014  FINDINGS: Lung volumes are low. Borderline cardiomegaly. Prominence of the bronchovascular structures, may be related to low lung volumes versus vascular congestion. No confluent airspace disease, large pleural effusion or pneumothorax. Surgical clips project over the thoracic  inlet.  IMPRESSION: Hypoventilatory chest. There is borderline cardiomegaly, which may be accentuated by low lung volumes. Similarly, prominence of bronchovascular structures, low lung volumes versus vascular congestion.   Electronically Signed   By: Jeb Levering M.D.   On: 11/12/2014 22:51   Dg Chest Port 1 View  10/27/2014   CLINICAL DATA:  Shortness of breath and chest pain.  EXAM: PORTABLE CHEST - 1 VIEW  COMPARISON:  08/14/2014  FINDINGS: The heart size and mediastinal contours are within normal limits. Both lungs are clear. The visualized skeletal structures are unremarkable.  IMPRESSION: No active disease.   Electronically Signed   By: Kerby Moors M.D.   On: 10/27/2014 23:17    Microbiology: Recent Results (from the past 240 hour(s))  Culture, blood (routine x 2)     Status: None (Preliminary result)   Collection Time: 11/12/14 10:25 PM  Result Value Ref Range Status   Specimen Description BLOOD LEFT HAND  Final   Special Requests BOTTLES DRAWN AEROBIC ONLY 3CC  Final   Culture NO GROWTH 2 DAYS  Final   Report Status PENDING  Incomplete  Culture, blood (routine x 2)     Status: None (Preliminary result)   Collection Time: 11/13/14  1:45 AM  Result Value Ref Range Status   Specimen Description BLOOD LEFT HAND  Final   Special Requests IN PEDIATRIC BOTTLE 1CC  Final   Culture NO GROWTH 1 DAY   Final   Report Status PENDING  Incomplete  MRSA PCR Screening     Status: None   Collection Time: 11/13/14  3:52 AM  Result Value Ref Range Status   MRSA by PCR NEGATIVE NEGATIVE Final    Comment:        The GeneXpert MRSA Assay (FDA approved for NASAL specimens only), is one component of a comprehensive MRSA colonization surveillance program. It is not intended to diagnose MRSA infection nor to guide or monitor treatment for MRSA infections.     Labs: Basic Metabolic Panel:  Recent Labs Lab 11/12/14 1935 11/12/14 1940 11/13/14 0408 11/14/14 0546  NA 137 138 140 143  K 4.4 4.3 3.9 3.9  CL 104 103 107 111  CO2 22  --  26 27  GLUCOSE 110* 110* 108* 109*  BUN 26* 29* 20 19  CREATININE 1.41* 1.40* 1.06* 1.11*  CALCIUM 9.4  --  8.9 8.9   Liver Function Tests:  Recent Labs Lab 11/12/14 1935  AST 19  ALT 21  ALKPHOS 76  BILITOT 0.2*  PROT 6.7  ALBUMIN 3.6   No results for input(s): LIPASE, AMYLASE in the last 168 hours.  Recent Labs Lab 11/12/14 2300  AMMONIA 14   CBC:  Recent Labs Lab 11/12/14 1935 11/12/14 1940 11/13/14 0408 11/14/14 0546  WBC 17.5*  --  13.5* 9.3  NEUTROABS 14.0*  --   --   --   HGB 11.0* 12.9 9.1* 9.2*  HCT 34.9* 38.0 28.6* 28.0*  MCV 87.5  --  86.4 85.9  PLT 297  --  237 219   Cardiac Enzymes: No results for input(s): CKTOTAL, CKMB, CKMBINDEX, TROPONINI in the last 168 hours. BNP: BNP (last 3 results) No results for input(s): BNP in the last 8760 hours.  ProBNP (last 3 results)  Recent Labs  01/06/14 1010  PROBNP 14.5   CBG:  Recent Labs Lab 11/14/14 0635 11/14/14 1103 11/14/14 1615 11/14/14 2132 11/15/14 0612  GLUCAP 110* 111* 94 114* 104*    Signed:  Oren Binet, MD  [facility] 11/15/2014, 10:57 AM

## 2014-11-15 NOTE — Clinical Social Work Note (Signed)
Clinical Social Work Assessment  Patient Details  Name: [name] MRN: [id number] Date of Birth: 12-07-1955  Date of referral:  11/15/14               Reason for consult:  Facility Placement                 Housing/Transportation Living arrangements for the past 2 months:  Apartment Source of Information:  Patient Patient Interpreter Needed:  None Criminal Activity/Legal Involvement Pertinent to Current Situation/Hospitalization:  No - Comment as needed Significant Relationships:  Adult Children Lives with:  Self Do you feel safe going back to the place where you live?  Yes Need for family participation in patient care:  No (Coment)  Care giving concerns: Pt expressed concerns about the care she received in the past while she was at a SNF.     Social Worker assessment / plan:  CSW informed that the pt changed her mind about SNF placement.  CSW met with the pt at the bedside. CSW introduced self and purpose of the visit. CSW discussed SNF rehab. CSW explained the SNF process. CSW explained insurance and its relation to SNF placement. CSW provided the pt with a SNF list. CSW answered all questions in which the pt inquired about. CSW will continue to follow this pt and assist with discharge as needed  Employment status:  Disabled (Comment on whether or not currently receiving Disability) Insurance information:  Managed Medicare PT Recommendations:  [place] / Referral to community resources:  [place]  Patient/Family's Response to care: Pt shared that the medical staff has been awesome in caring for her.   Patient/Family's Understanding of and Emotional Response to Diagnosis, Current Treatment, and Prognosis:  Pt was tearful. Pt provided details about her condition. Pt expressed the desire to remain out of the hospital. Pt agreed with the care plan.   Emotional Assessment Appearance:  Appears stated age Attitude/Demeanor/Rapport:   Crying Affect (typically observed):  Accepting Orientation:  Oriented to Self, Oriented to Place, Oriented to  Time, Oriented to Situation Alcohol / Substance use:  Not Applicable Psych involvement (Current and /or in the community):  No (Comment)  Discharge Needs  Concerns to be addressed:  Denies Needs/Concerns at this time Readmission within the last 30 days:  No Current discharge risk:  None Barriers to Discharge:  No Barriers Identified   Ayo Smoak, LCSW 11/15/2014, 9:57 AM

## 2014-11-15 NOTE — Progress Notes (Signed)
Physical Therapy Treatment Patient Details Name: [name] MRN: [id number] DOB: 1955-06-04 Today's Date: 11/15/2014    History of Present Illness 59 y.o. female with PMH of HTN, HLD, GERD, depression, chronic low back pain, medically neuropathy, asthma, TIA, DM2, Hypothyroid, CKD-III, OSA, previous Breast Ca (S/P Lumpectomy and Radiation Rx), who presents with altered mental status and possible slurred speech. Pt was very lethargic and visually hallucinating. Patient was recently hospitalized from 9/9 to 10/30/14 due to syncope.     PT Comments    Progressing slowly towards physical therapy goals. Tolerated more mobility today with gait training up to 55 feet however, demonstrating very poor postural tolerance due to back pain radiating down BIL LEs. Patient is without assistance at home. Feel that she needs aggressive physical therapy at discharge to reverse progressive functional decline from current impairments. Patient will continue to benefit from skilled physical therapy services to further improve independence with functional mobility.   Follow Up Recommendations  SNF;Supervision for mobility/OOB     Equipment Recommendations  None recommended by PT    Recommendations for Other Services       Precautions / Restrictions Precautions Precautions: Fall Precaution Comments: long hx of back pain Restrictions Weight Bearing Restrictions: No    Mobility  Bed Mobility Overal bed mobility: Modified Independent             General bed mobility comments: heavy use of bed rails, increased time. HOB slightly elevated  Transfers Overall transfer level: Needs assistance Equipment used: Rolling walker (2 wheeled) Transfers: Sit to/from Stand Sit to Stand: From elevated surface;Min guard         General transfer comment: Min guard for safety VC for hand placement. Slow to rise with report of increased pain upon attempting upright  posture.  Ambulation/Gait Ambulation/Gait assistance: Min guard Ambulation Distance (Feet): 55 Feet Assistive device: Rolling walker (2 wheeled) Gait Pattern/deviations: Step-through pattern;Decreased stride length;Shuffle;Trunk flexed Gait velocity: slow Gait velocity interpretation: <1.8 ft/sec, indicative of risk for recurrent falls General Gait Details: Significant reliance on RW for support. Poor ability to tolerate upright posture, reporting radiating pain through BIL LEs. Cues for walker placement for proximity, especially with turns. Slight buckling noted when LE pain radiates from back but able to self correct this date.   Stairs            Wheelchair Mobility    Modified Rankin (Stroke Patients Only)       Balance                                    Cognition Arousal/Alertness: Lethargic Behavior During Therapy: Flat affect Overall Cognitive Status: Within Functional Limits for tasks assessed                      Exercises      General Comments General comments (skin integrity, edema, etc.): Educated on importance of posture as tolerated, and being OOB to prevent secondary complications.      Pertinent Vitals/Pain Pain Assessment: Faces Faces Pain Scale: Hurts even more Pain Location: back Pain Descriptors / Indicators: Grimacing Pain Intervention(s): Monitored during session;Repositioned;Limited activity within patient's tolerance    Home Living                      Prior Function            PT Goals (current goals can now  be found in the care plan section) Acute Rehab PT Goals Patient Stated Goal: Feel better PT Goal Formulation: With patient Time For Goal Achievement: 11/27/14 Potential to Achieve Goals: Good Progress towards PT goals: Progressing toward goals    Frequency  Min 3X/week    PT Plan Discharge plan needs to be updated    Co-evaluation             End of Session Equipment Utilized  During Treatment: Gait belt Activity Tolerance: Patient limited by pain Patient left: with call bell/phone within reach;in chair;with chair alarm set     Time: 0920-0940 PT Time Calculation (min) (ACUTE ONLY): 20 min  Charges:  $Gait Training: 8-22 mins                    G Codes:      Ellouise Newer 12/08/2014, 9:56 AM Elayne Snare, [place]

## 2014-11-16 ENCOUNTER — Other Ambulatory Visit: Payer: Self-pay

## 2014-11-16 MED ORDER — NUCYNTA ER 200 MG PO TB12: 200.0000 mg | ORAL_TABLET | Freq: Two times a day (BID) | ORAL | Status: DC

## 2014-11-16 NOTE — Telephone Encounter (Signed)
Rx faxed to [facility] @ [phone], phone number [phone]  

## 2014-11-17 LAB — CULTURE, BLOOD (ROUTINE X 2): Culture: NO GROWTH

## 2014-11-18 LAB — CULTURE, BLOOD (ROUTINE X 2): Culture: NO GROWTH

## 2014-11-20 DIAGNOSIS — R52 Pain, unspecified: Secondary | ICD-10-CM | POA: Diagnosis not present

## 2014-11-20 DIAGNOSIS — G9349 Other encephalopathy: Secondary | ICD-10-CM | POA: Diagnosis not present

## 2014-11-20 DIAGNOSIS — N183 Chronic kidney disease, stage 3 (moderate): Secondary | ICD-10-CM | POA: Diagnosis not present

## 2014-11-20 DIAGNOSIS — I129 Hypertensive chronic kidney disease with stage 1 through stage 4 chronic kidney disease, or unspecified chronic kidney disease: Secondary | ICD-10-CM | POA: Diagnosis not present

## 2014-11-20 DIAGNOSIS — Z23 Encounter for immunization: Secondary | ICD-10-CM | POA: Diagnosis not present

## 2014-11-22 IMAGING — US US ABDOMEN COMPLETE
1 series · 14 of 25 positions shown · non-contrast
Comparison: Renal ultrasound 07/10/2007

CLINICAL DATA: Renal insufficiency, anemia, breast cancer.
Abdominal discomfort.

EXAM:
ULTRASOUND ABDOMEN COMPLETE

[Series 1: us abdomen complete · 0.28mm/px · 14 of 71 slices shown]
[im 1/71]
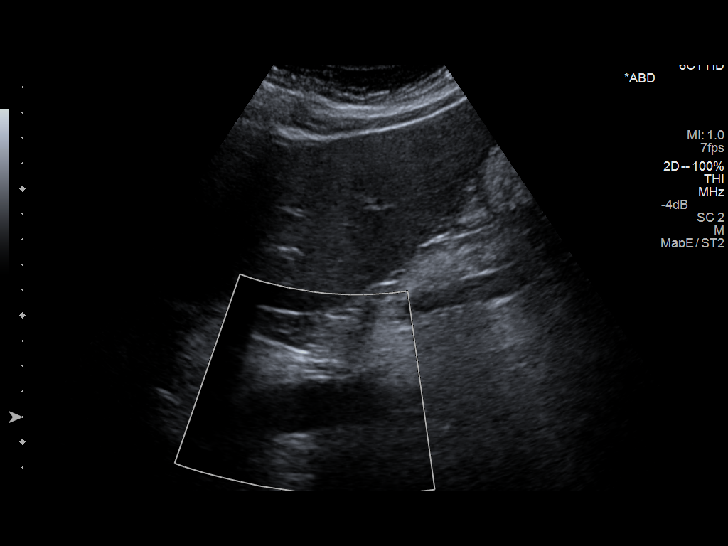
[im 6/71]
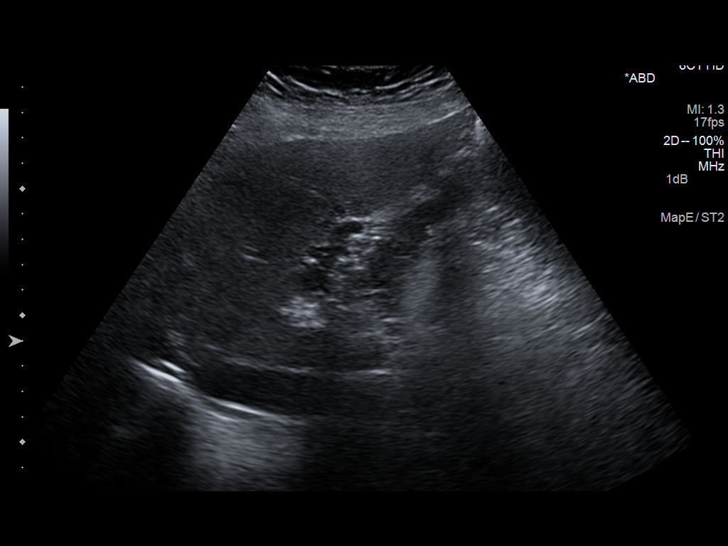
[im 12/71]
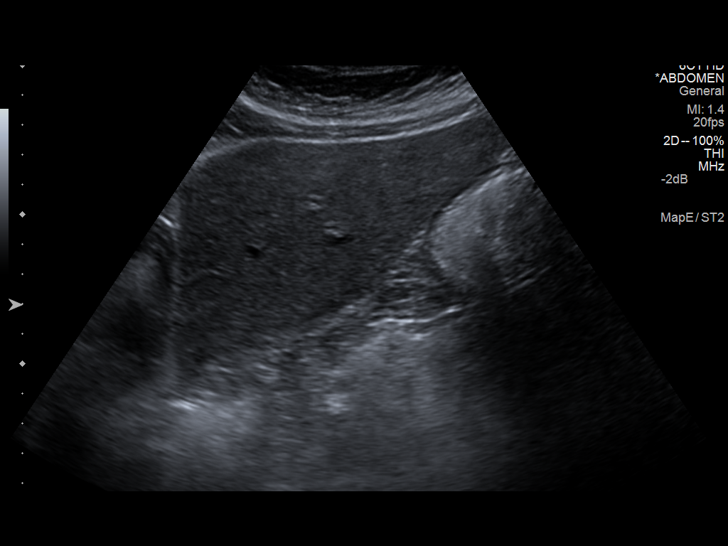
[im 18/71]
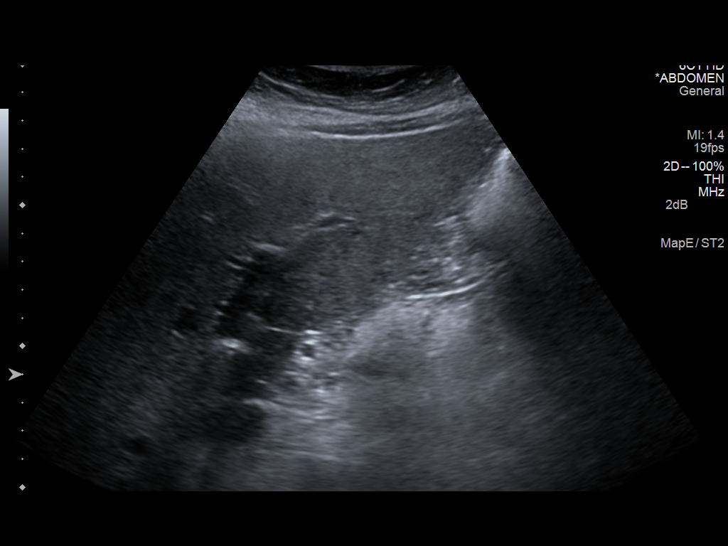
[im 24/71]
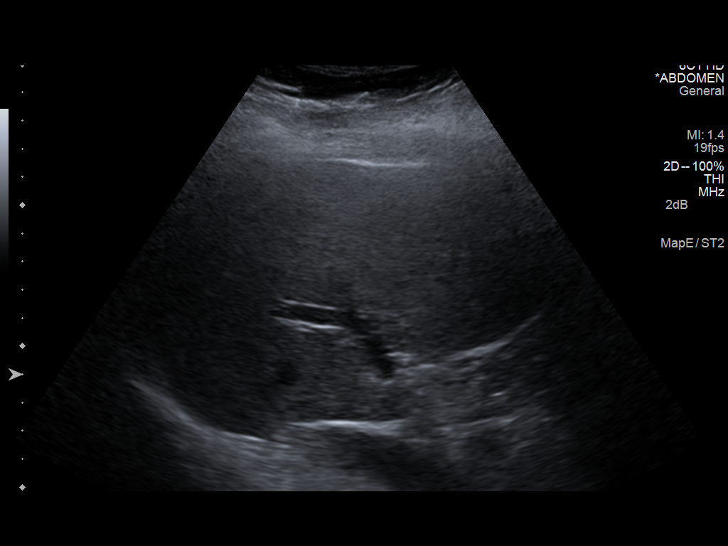
[im 27/71]
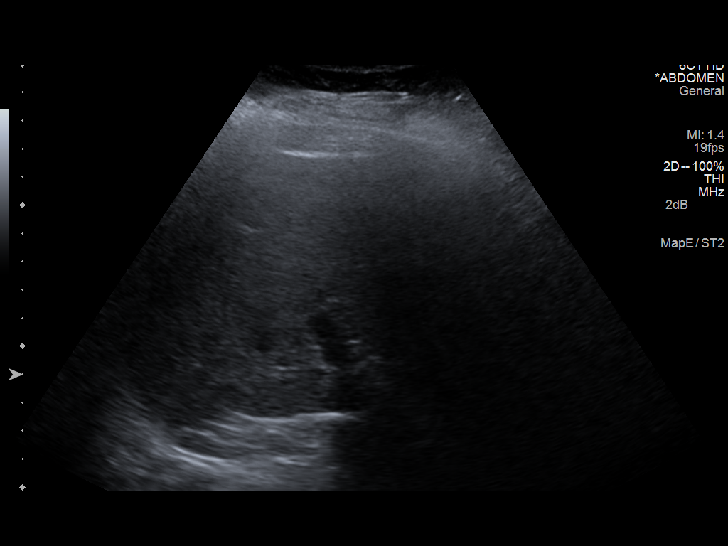
[im 33/71]
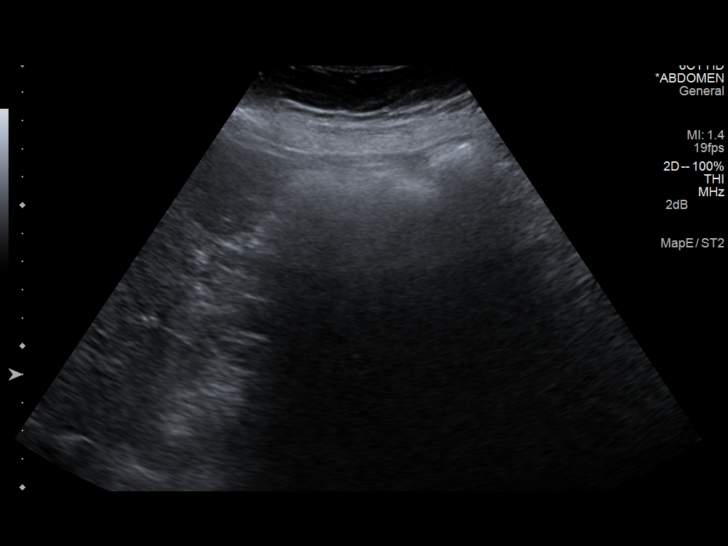
[im 38/71]
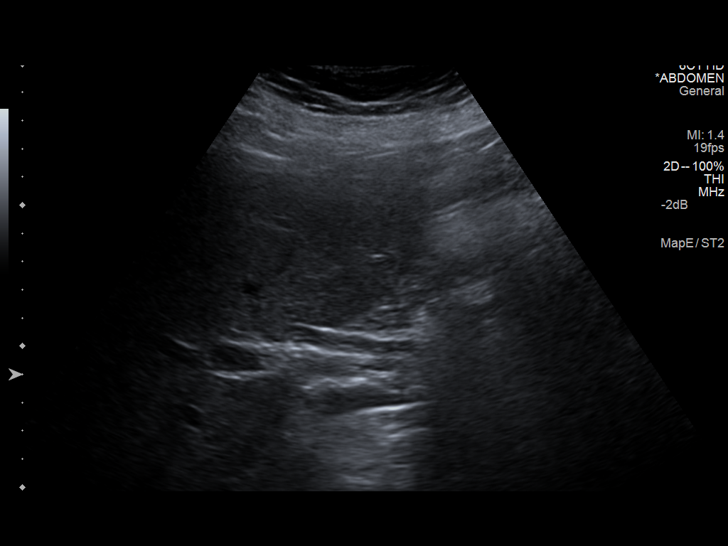
[im 44/71]
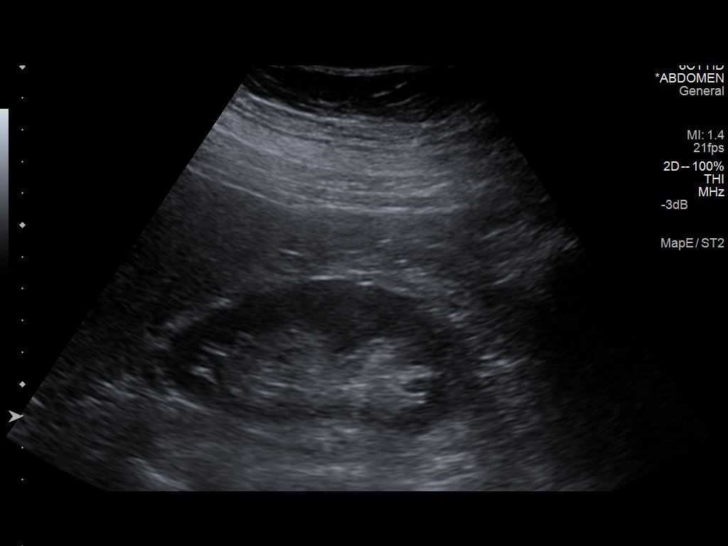
[im 47/71]
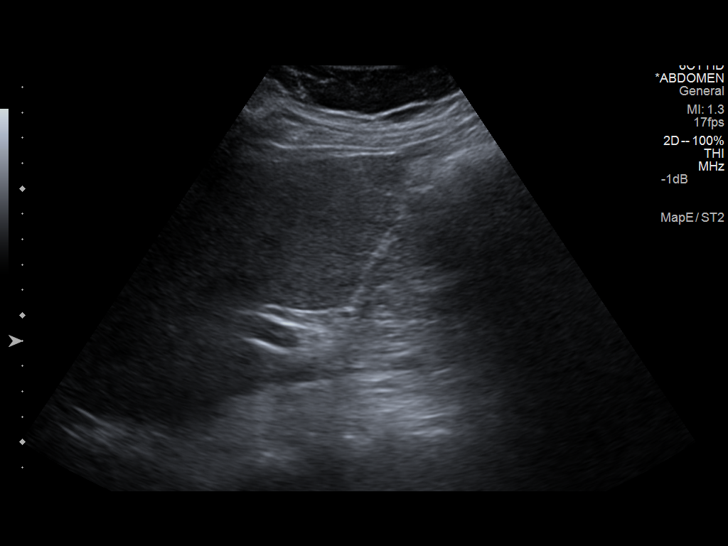
[im 53/71]
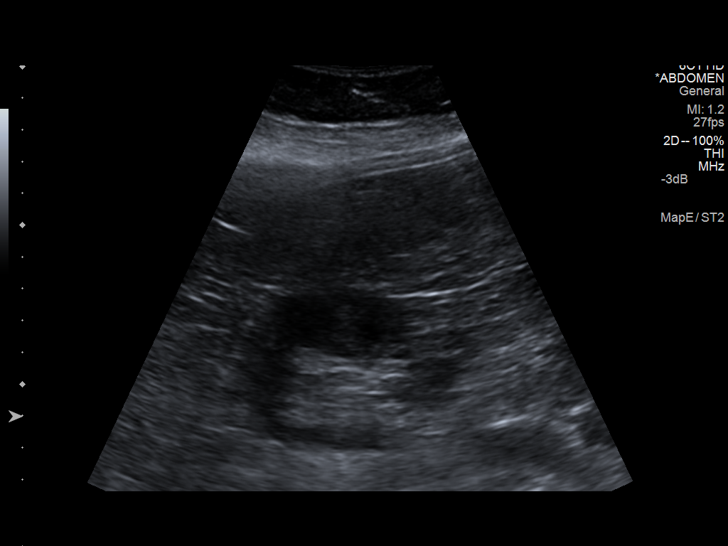
[im 59/71]
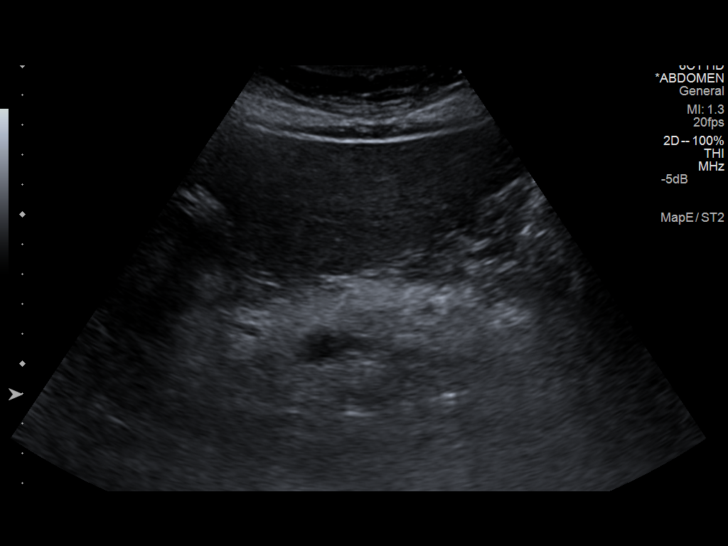
[im 65/71]
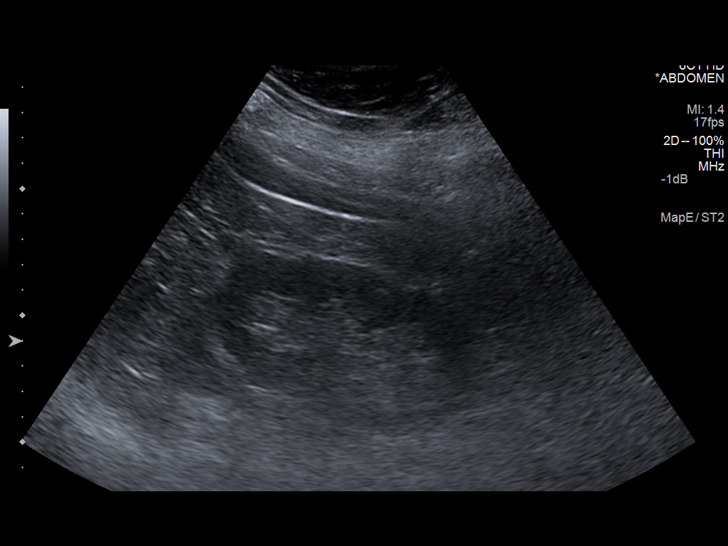
[im 71/71]
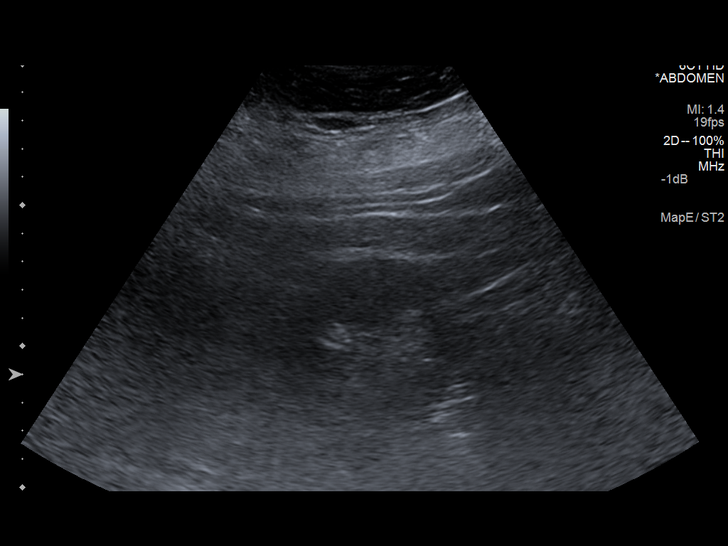

[14 of 25 positions shown; findings below may reference images not displayed]

FINDINGS: Gallbladder:

Surgically absent.

Common bile duct:

Diameter: 6.6 mm

Liver:

No focal lesion identified. Within normal limits in parenchymal
echogenicity.

IVC:

No abnormality visualized.

Pancreas:

Visualized portion unremarkable.

Spleen:

Size and appearance within normal limits.

Right Kidney:

Length: 10.3 cm. Echogenicity within normal limits. No mass or
hydronephrosis visualized.

Left Kidney:

Length: 12.3 cm. Suboptimally visualized without mass or
hydronephrosis seen.

Abdominal aorta:

No aneurysm visualized.  Bifurcation not seen.

Other findings:

None.
IMPRESSION: 1. Unremarkable appearance of the liver.
2. Status postcholecystectomy.  No biliary dilatation.
3. No hydronephrosis.

## 2014-11-29 ENCOUNTER — Other Ambulatory Visit: Payer: Self-pay

## 2014-11-29 DIAGNOSIS — E1142 Type 2 diabetes mellitus with diabetic polyneuropathy: Secondary | ICD-10-CM | POA: Diagnosis not present

## 2014-11-29 DIAGNOSIS — Z794 Long term (current) use of insulin: Secondary | ICD-10-CM | POA: Diagnosis not present

## 2014-11-29 DIAGNOSIS — F331 Major depressive disorder, recurrent, moderate: Secondary | ICD-10-CM | POA: Diagnosis not present

## 2014-11-29 DIAGNOSIS — Z79891 Long term (current) use of opiate analgesic: Secondary | ICD-10-CM | POA: Diagnosis not present

## 2014-11-29 DIAGNOSIS — J45909 Unspecified asthma, uncomplicated: Secondary | ICD-10-CM | POA: Diagnosis not present

## 2014-11-29 DIAGNOSIS — Z6841 Body Mass Index (BMI) 40.0 and over, adult: Secondary | ICD-10-CM | POA: Diagnosis not present

## 2014-11-29 DIAGNOSIS — M6281 Muscle weakness (generalized): Secondary | ICD-10-CM | POA: Diagnosis not present

## 2014-11-29 DIAGNOSIS — I10 Essential (primary) hypertension: Secondary | ICD-10-CM | POA: Diagnosis not present

## 2014-11-29 DIAGNOSIS — M179 Osteoarthritis of knee, unspecified: Secondary | ICD-10-CM | POA: Diagnosis not present

## 2014-11-29 DIAGNOSIS — Z87891 Personal history of nicotine dependence: Secondary | ICD-10-CM | POA: Diagnosis not present

## 2014-11-29 DIAGNOSIS — G8929 Other chronic pain: Secondary | ICD-10-CM | POA: Diagnosis not present

## 2014-11-29 DIAGNOSIS — M545 Low back pain: Secondary | ICD-10-CM | POA: Diagnosis not present

## 2014-11-29 MED ORDER — OXYCODONE HCL 15 MG PO TABS: 15.0000 mg | ORAL_TABLET | Freq: Four times a day (QID) | ORAL | Status: DC | PRN

## 2014-11-29 MED ORDER — LORAZEPAM 2 MG PO TABS: 2.0000 mg | ORAL_TABLET | Freq: Two times a day (BID) | ORAL | Status: DC | PRN

## 2014-11-29 NOTE — Telephone Encounter (Signed)
Rx faxed to [facility] @ [phone], phone number [phone]

## 2014-11-29 NOTE — Progress Notes (Signed)
Late entry for missed gcode from December 12, 2014:   28-Dec-2014 0800  PT G-Codes **NOT FOR INPATIENT CLASS**  Functional Assessment Tool Used clinical judgement  Functional Limitation Mobility: Walking and moving around  Mobility: Walking and Moving Around Current Status (G9030) CJ  Mobility: Walking and Moving Around Goal Status [phone]) CI  [facility] [phone] (pager)

## 2014-12-01 ENCOUNTER — Ambulatory Visit (INDEPENDENT_AMBULATORY_CARE_PROVIDER_SITE_OTHER): Payer: 59 | Admitting: Neurology

## 2014-12-01 ENCOUNTER — Encounter: Payer: Self-pay | Admitting: Neurology

## 2014-12-01 VITALS — BP 144/87 | HR 107 | Ht 65.0 in | Wt 290.8 lb

## 2014-12-01 DIAGNOSIS — R55 Syncope and collapse: Secondary | ICD-10-CM

## 2014-12-01 NOTE — Patient Instructions (Signed)

## 2014-12-01 NOTE — Progress Notes (Signed)
Reason for visit: Syncope  Referring physician: [facility]  [name] is a 59 y.o. female  History of present illness:  [name] is a 59 year old right-handed black female with a history of schizophrenia, and chronic low back pain. She has diabetes as well, on insulin. She was admitted to the hospital around September 12 and again on September 25. The first admission was for what appeared to be a syncopal event at home. The patient had some recent adjustments in blood pressure medications and the patient was felt to have had some problems with low blood pressures. The patient was admitted again on 11/12/2014. At this point, the patient was found to have some lethargy, and appeared to be confused, "talking out of her head", and hallucinating. The patient was felt of had some issues with overdosing on benzodiazepine medications and opiate medications. She has had some dosage adjustments of these, and she has had one episode of loss of consciousness or confusion since coming out of the hospital, but she has done well over the last 2 weeks. The patient is having increased back pain on the lower dose of opiate medications, however. The patient reports that she is on CPAP for sleep apnea, but she is not sleeping well at night following the death of her mother, she is unable to rest well. She does nap some during the day. The patient indicates that occasionally her blood sugars may run low, her family does not know how to do the glucometer readings, they have not checked a blood sugar around the time of one of the blackouts or confusional events. The patient does get some headaches off and on, she reports some occasional facial numbness. In the hospital, the patient underwent an extensive workup that included a 2-D echocardiogram, carotid Doppler study, and MRI of the brain that were unremarkable. An EEG study showed some mild generalized slowing. No epileptiform discharges were seen. She is sent  to this office for an evaluation.  Past Medical History  Diagnosis Date  . Depression   . Thyroid disease   . GERD (gastroesophageal reflux disease)   . Pancreatitis     hx of  . Knee pain, bilateral   . Obesity   . HSV (herpes simplex virus) infection   . Hyperlipemia   . Asthma   . Tachycardia     with sx in 2008  . Breast mass in female     L breast 2008, US showed likely fat necrosis  . Anxiety   . Hypertension     sees Dr. Criss Rosales , [facility]  . Hx of radiation therapy 05/05/12- 07/15/12    right breast, 45 gray x 25 fx, lumpectomy cavity boosted to 16.2 gray  . History of blood transfusion 2011    "after one of my OR's"  . Hypothyroidism   . 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 Polyneuropathy in diabetes(357.2)   . Benign paroxysmal positional vertigo 12/30/2012  . Breast cancer ([place]) 02/13/12    ruq  100'clock bx Ductal Carcinoma in Situ,(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  . OSA on CPAP     pt does not know settings  . Pneumonia 1950's  . Anemia   . Type II diabetes mellitus ([place])   . History of hiatal hernia   . History of stomach ulcers   . Daily headache   . Migraines     "~ 3 times/month" (01/06/2014)  . Arthritis     "pretty much all my joints"  .  Chronic lower back pain   . Schizophrenia ([place])   . Kidney stones   . CKD (chronic kidney disease), stage III     "lower stage" (01/06/2014)    Past Surgical History  Procedure Laterality Date  . Total knee arthroplasty Bilateral 2009-06/2009    left; right  . Foot fracture surgery Right 1990's  . Shoulder open rotator cuff repair Left 1990's  . Thyroidectomy  1970's  . Joint replacement    . Abdominal hysterectomy  1979?    partial  . Cholecystectomy  1980's?  . Colonoscopy with propofol N/A 09/28/2012    Procedure: COLONOSCOPY WITH PROPOFOL;  Surgeon: Lear Ng, MD;  Location: [facility] ENDOSCOPY;  Service: Endoscopy;  Laterality: N/A;  . Revision total knee arthroplasty  07/2009  . Breast surgery Right  01/2012    "cancer"  . Breast lumpectomy  03/03/2012    Procedure: LUMPECTOMY;  Surgeon: Haywood Lasso, MD;  [facility];  Service: General;  Laterality: Right;    Family History  Problem Relation Age of Onset  . Heart disease Brother     Multiple MIs, starting in his 28s  . Diabetes Brother   . Hypertension Mother   . Diabetes Mother   . Breast cancer Mother 64  . Bone cancer Mother   . Hypertension Sister   . Diabetes Sister   . Breast cancer Sister 1  . Breast cancer Maternal Grandmother   . Heart disease Maternal Grandmother   . Uterine cancer Other 19  . Breast cancer Paternal Aunt 49  . Breast cancer Paternal Grandmother     dx in her 77s  . Prostate cancer Paternal Grandfather     Social history:  reports that she quit smoking about 22 years ago. Her smoking use included Cigarettes. She has a 7 pack-year smoking history. She has never used smokeless tobacco. She reports that she does not drink alcohol or use illicit drugs.  Medications:  Prior to Admission medications   Medication Sig Start Date End Date Taking? Authorizing Provider  aspirin EC 81 MG tablet Take 1 tablet (81 mg total) by mouth daily. Patient taking differently: Take 81 mg by mouth at bedtime.  08/12/13  Yes Adeline Saralyn Pilar, MD  cholecalciferol (VITAMIN D) 1000 UNITS tablet Take 1 tablet (1,000 Units total) by mouth daily. 11/18/12  Yes Chauncey Cruel, MD  diclofenac sodium (VOLTAREN) 1 % GEL Apply 2 g topically 2 (two) times daily as needed (pain).    Yes Historical Provider, MD  esomeprazole (NEXIUM) 40 MG capsule Take 1 capsule (40 mg total) by mouth daily before breakfast. 10/30/14  Yes Barton Dubois, MD  ferrous sulfate 325 (65 FE) MG EC tablet Take 325 mg by mouth daily with breakfast.   Yes Historical Provider, MD  gabapentin (NEURONTIN) 100 MG capsule Take 100 mg by mouth 3 (three) times daily. Patient takes 1 tablet in the am and at noon and 3 tablets at bedtime. 10/04/14  Yes Historical  Provider, MD  insulin aspart (NOVOLOG) 100 UNIT/ML injection Inject 0-6 Units into the skin 3 (three) times daily as needed for high blood sugar (200-299 = 4 units, 300 and above = 6 units). Sliding scale   Yes Historical Provider, MD  insulin detemir (LEVEMIR) 100 UNIT/ML injection Inject 10 Units into the skin at bedtime.   Yes Historical Provider, MD  Iron-Vitamins (GERITOL COMPLETE) TABS Take 1 tablet by mouth daily.   Yes Historical Provider, MD  levothyroxine (SYNTHROID, LEVOTHROID) 150 MCG tablet  Take 150 mcg by mouth daily before breakfast.   Yes Historical Provider, MD  LORazepam (ATIVAN) 2 MG tablet Take 1 tablet (2 mg total) by mouth every 12 (twelve) hours as needed for anxiety. 11/29/14  Yes Gildardo Cranker, DO  meloxicam (MOBIC) 15 MG tablet Take 15 mg by mouth daily.    Yes Historical Provider, MD  NUCYNTA ER 200 MG TB12 Take 200 mg by mouth every 12 (twelve) hours. 11/16/14  Yes Lauree Chandler, NP  ondansetron (ZOFRAN) 4 MG tablet Take 1 tablet (4 mg total) by mouth every 8 (eight) hours as needed for nausea or vomiting. 11/15/14  Yes Shanker Kristeen Mans, MD  oxyCODONE (ROXICODONE) 15 MG immediate release tablet Take 1 tablet (15 mg total) by mouth every 6 (six) hours as needed for pain. 11/29/14  Yes Gildardo Cranker, DO  polyethylene glycol (MIRALAX) packet Take 17 g by mouth daily. 11/15/14  Yes Shanker Kristeen Mans, MD  pregabalin (LYRICA) 150 MG capsule Take 1 capsule (150 mg total) by mouth 2 (two) times daily. 11/15/14  Yes Shanker Kristeen Mans, MD  rosuvastatin (CRESTOR) 20 MG tablet Take 20 mg by mouth daily.   Yes Historical Provider, MD  senna-docusate (SENOKOT-S) 8.6-50 MG tablet Take 2 tablets by mouth at bedtime. 11/15/14  Yes Shanker Kristeen Mans, MD  sitaGLIPtin-metformin (JANUMET) 50-500 MG per tablet Take 1 tablet by mouth 2 (two) times daily with a meal.   Yes Historical Provider, MD  valACYclovir (VALTREX) 1000 MG tablet Take 1,000 mg by mouth 2 (two) times daily.    Yes Historical  Provider, MD  Venlafaxine HCl 150 MG TB24 Take 150 mg by mouth daily.   Yes Historical Provider, MD  ziprasidone (GEODON) 20 MG capsule Take 20 mg by mouth every evening.   Yes Historical Provider, MD      Allergies  Allergen Reactions  . Shellfish Allergy Shortness Of Breath  . Bee Venom Hives  . Morphine And Related Other (See Comments)    Overly sedated    ROS:  Out of a complete 14 system review of symptoms, the patient complains only of the following symptoms, and all other reviewed systems are negative.  Fatigue Double vision Snoring Constipation Anemia, easy bruising Increased thirst Joint pain, joint swelling, muscle cramps, aching muscles Runny nose Memory loss, confusion, headache, numbness, weakness, slurred speech, dizziness, passing out Depression, anxiety, numbness sleep, hallucinations Insomnia  Blood pressure 144/87, pulse 107, height _0  (1.651 m), weight 290 lb 12.8 oz (131.906 kg).  Physical Exam  General: The patient is alert and cooperative at the time of the examination. The patient is markedly obese.  Eyes: Pupils are equal, round, and reactive to light. Discs are flat bilaterally.  Neck: The neck is supple, no carotid bruits are noted.  Respiratory: The respiratory examination is clear.  Cardiovascular: The cardiovascular examination reveals a regular rate and rhythm, no obvious murmurs or rubs are noted.  Skin: Extremities are without significant edema.  Neurologic Exam  Mental status: The patient is alert and oriented x 3 at the time of the examination. The patient has apparent normal recent and remote memory, with an apparently normal attention span and concentration ability.  Cranial nerves: Facial symmetry is present. There is good sensation of the face to pinprick and soft touch bilaterally. The strength of the facial muscles and the muscles to head turning and shoulder shrug are normal bilaterally. Speech is well enunciated, no  aphasia or dysarthria is noted. Extraocular movements are full. Visual  fields are full. The tongue is midline, and the patient has symmetric elevation of the soft palate. No obvious hearing deficits are noted.  Motor: The motor testing reveals 5 over 5 strength of all 4 extremities. Good symmetric motor tone is noted throughout.  Sensory: Sensory testing is intact to pinprick, soft touch, vibration sensation, and position sense on all 4 extremities. No evidence of extinction is noted.  Coordination: Cerebellar testing reveals good finger-nose-finger and heel-to-shin bilaterally.  Gait and station: With ambulation, the patient is stooped, normally she will use a walker. Tandem gait was not attempted. Romberg is negative. No drift is seen.  Reflexes: Deep tendon reflexes are symmetric, but are depressed bilaterally. Toes are downgoing bilaterally.   Assessment/Plan:  1. Episodes of confusion, syncope  2. Sleep apnea on CPAP  3. Chronic low back pain  4. Gait disturbance  5. History of schizophrenia  The patient has had events of confusion and lethargy that were felt to be secondary to medications. The patient has actually done fairly well following a dose reduction of the opiate medications. The patient will be sent for a repeat EEG study. The patient continues to have events of loss of consciousness, a prolonged cardiac monitor study can be done.  Jill Alexanders MD 12/01/2014 2:40 PM  [facility] [address] [place], [place] [address]  Phone [phone] Fax [phone]

## 2014-12-05 DIAGNOSIS — Z794 Long term (current) use of insulin: Secondary | ICD-10-CM | POA: Diagnosis not present

## 2014-12-05 DIAGNOSIS — M545 Low back pain: Secondary | ICD-10-CM | POA: Diagnosis not present

## 2014-12-05 DIAGNOSIS — Z79891 Long term (current) use of opiate analgesic: Secondary | ICD-10-CM | POA: Diagnosis not present

## 2014-12-05 DIAGNOSIS — Z87891 Personal history of nicotine dependence: Secondary | ICD-10-CM | POA: Diagnosis not present

## 2014-12-05 DIAGNOSIS — E1142 Type 2 diabetes mellitus with diabetic polyneuropathy: Secondary | ICD-10-CM | POA: Diagnosis not present

## 2014-12-05 DIAGNOSIS — G8929 Other chronic pain: Secondary | ICD-10-CM | POA: Diagnosis not present

## 2014-12-05 DIAGNOSIS — Z6841 Body Mass Index (BMI) 40.0 and over, adult: Secondary | ICD-10-CM | POA: Diagnosis not present

## 2014-12-05 DIAGNOSIS — F331 Major depressive disorder, recurrent, moderate: Secondary | ICD-10-CM | POA: Diagnosis not present

## 2014-12-05 DIAGNOSIS — M179 Osteoarthritis of knee, unspecified: Secondary | ICD-10-CM | POA: Diagnosis not present

## 2014-12-05 DIAGNOSIS — I10 Essential (primary) hypertension: Secondary | ICD-10-CM | POA: Diagnosis not present

## 2014-12-05 DIAGNOSIS — M6281 Muscle weakness (generalized): Secondary | ICD-10-CM | POA: Diagnosis not present

## 2014-12-05 DIAGNOSIS — J45909 Unspecified asthma, uncomplicated: Secondary | ICD-10-CM | POA: Diagnosis not present

## 2014-12-07 DIAGNOSIS — Z79891 Long term (current) use of opiate analgesic: Secondary | ICD-10-CM | POA: Diagnosis not present

## 2014-12-07 DIAGNOSIS — E1142 Type 2 diabetes mellitus with diabetic polyneuropathy: Secondary | ICD-10-CM | POA: Diagnosis not present

## 2014-12-07 DIAGNOSIS — Z794 Long term (current) use of insulin: Secondary | ICD-10-CM | POA: Diagnosis not present

## 2014-12-07 DIAGNOSIS — M6281 Muscle weakness (generalized): Secondary | ICD-10-CM | POA: Diagnosis not present

## 2014-12-07 DIAGNOSIS — J45909 Unspecified asthma, uncomplicated: Secondary | ICD-10-CM | POA: Diagnosis not present

## 2014-12-07 DIAGNOSIS — F331 Major depressive disorder, recurrent, moderate: Secondary | ICD-10-CM | POA: Diagnosis not present

## 2014-12-07 DIAGNOSIS — Z87891 Personal history of nicotine dependence: Secondary | ICD-10-CM | POA: Diagnosis not present

## 2014-12-07 DIAGNOSIS — I10 Essential (primary) hypertension: Secondary | ICD-10-CM | POA: Diagnosis not present

## 2014-12-07 DIAGNOSIS — G8929 Other chronic pain: Secondary | ICD-10-CM | POA: Diagnosis not present

## 2014-12-07 DIAGNOSIS — Z6841 Body Mass Index (BMI) 40.0 and over, adult: Secondary | ICD-10-CM | POA: Diagnosis not present

## 2014-12-07 DIAGNOSIS — M545 Low back pain: Secondary | ICD-10-CM | POA: Diagnosis not present

## 2014-12-07 DIAGNOSIS — M179 Osteoarthritis of knee, unspecified: Secondary | ICD-10-CM | POA: Diagnosis not present

## 2014-12-08 ENCOUNTER — Ambulatory Visit
Admission: RE | Admit: 2014-12-08 | Discharge: 2014-12-08 | Disposition: A | Discharge status: Home / Self Care | Payer: Medicare Other | Source: Ambulatory Visit | Attending: Nurse Practitioner | Admitting: Nurse Practitioner

## 2014-12-08 DIAGNOSIS — Z780 Asymptomatic menopausal state: Secondary | ICD-10-CM | POA: Diagnosis not present

## 2014-12-11 DIAGNOSIS — M792 Neuralgia and neuritis, unspecified: Secondary | ICD-10-CM | POA: Diagnosis not present

## 2014-12-11 DIAGNOSIS — G894 Chronic pain syndrome: Secondary | ICD-10-CM | POA: Diagnosis not present

## 2014-12-11 DIAGNOSIS — M545 Low back pain: Secondary | ICD-10-CM | POA: Diagnosis not present

## 2014-12-11 DIAGNOSIS — M5137 Other intervertebral disc degeneration, lumbosacral region: Secondary | ICD-10-CM | POA: Diagnosis not present

## 2014-12-11 DIAGNOSIS — Z79899 Other long term (current) drug therapy: Secondary | ICD-10-CM | POA: Diagnosis not present

## 2014-12-12 DIAGNOSIS — E1142 Type 2 diabetes mellitus with diabetic polyneuropathy: Secondary | ICD-10-CM | POA: Diagnosis not present

## 2014-12-12 DIAGNOSIS — Z6841 Body Mass Index (BMI) 40.0 and over, adult: Secondary | ICD-10-CM | POA: Diagnosis not present

## 2014-12-12 DIAGNOSIS — I10 Essential (primary) hypertension: Secondary | ICD-10-CM | POA: Diagnosis not present

## 2014-12-12 DIAGNOSIS — M6281 Muscle weakness (generalized): Secondary | ICD-10-CM | POA: Diagnosis not present

## 2014-12-12 DIAGNOSIS — M545 Low back pain: Secondary | ICD-10-CM | POA: Diagnosis not present

## 2014-12-12 DIAGNOSIS — J45909 Unspecified asthma, uncomplicated: Secondary | ICD-10-CM | POA: Diagnosis not present

## 2014-12-12 DIAGNOSIS — G8929 Other chronic pain: Secondary | ICD-10-CM | POA: Diagnosis not present

## 2014-12-12 DIAGNOSIS — M179 Osteoarthritis of knee, unspecified: Secondary | ICD-10-CM | POA: Diagnosis not present

## 2014-12-12 DIAGNOSIS — Z794 Long term (current) use of insulin: Secondary | ICD-10-CM | POA: Diagnosis not present

## 2014-12-12 DIAGNOSIS — Z79891 Long term (current) use of opiate analgesic: Secondary | ICD-10-CM | POA: Diagnosis not present

## 2014-12-12 DIAGNOSIS — Z87891 Personal history of nicotine dependence: Secondary | ICD-10-CM | POA: Diagnosis not present

## 2014-12-12 DIAGNOSIS — F331 Major depressive disorder, recurrent, moderate: Secondary | ICD-10-CM | POA: Diagnosis not present

## 2014-12-13 DIAGNOSIS — G8929 Other chronic pain: Secondary | ICD-10-CM | POA: Diagnosis not present

## 2014-12-13 DIAGNOSIS — M6281 Muscle weakness (generalized): Secondary | ICD-10-CM | POA: Diagnosis not present

## 2014-12-13 DIAGNOSIS — I10 Essential (primary) hypertension: Secondary | ICD-10-CM | POA: Diagnosis not present

## 2014-12-13 DIAGNOSIS — Z87891 Personal history of nicotine dependence: Secondary | ICD-10-CM | POA: Diagnosis not present

## 2014-12-13 DIAGNOSIS — M179 Osteoarthritis of knee, unspecified: Secondary | ICD-10-CM | POA: Diagnosis not present

## 2014-12-13 DIAGNOSIS — Z794 Long term (current) use of insulin: Secondary | ICD-10-CM | POA: Diagnosis not present

## 2014-12-13 DIAGNOSIS — F331 Major depressive disorder, recurrent, moderate: Secondary | ICD-10-CM | POA: Diagnosis not present

## 2014-12-13 DIAGNOSIS — Z6841 Body Mass Index (BMI) 40.0 and over, adult: Secondary | ICD-10-CM | POA: Diagnosis not present

## 2014-12-13 DIAGNOSIS — E1142 Type 2 diabetes mellitus with diabetic polyneuropathy: Secondary | ICD-10-CM | POA: Diagnosis not present

## 2014-12-13 DIAGNOSIS — J45909 Unspecified asthma, uncomplicated: Secondary | ICD-10-CM | POA: Diagnosis not present

## 2014-12-13 DIAGNOSIS — Z79891 Long term (current) use of opiate analgesic: Secondary | ICD-10-CM | POA: Diagnosis not present

## 2014-12-13 DIAGNOSIS — M545 Low back pain: Secondary | ICD-10-CM | POA: Diagnosis not present

## 2014-12-15 DIAGNOSIS — G9349 Other encephalopathy: Secondary | ICD-10-CM | POA: Diagnosis not present

## 2014-12-15 DIAGNOSIS — R52 Pain, unspecified: Secondary | ICD-10-CM | POA: Diagnosis not present

## 2014-12-15 DIAGNOSIS — Z23 Encounter for immunization: Secondary | ICD-10-CM | POA: Diagnosis not present

## 2014-12-15 DIAGNOSIS — N183 Chronic kidney disease, stage 3 (moderate): Secondary | ICD-10-CM | POA: Diagnosis not present

## 2014-12-15 DIAGNOSIS — I129 Hypertensive chronic kidney disease with stage 1 through stage 4 chronic kidney disease, or unspecified chronic kidney disease: Secondary | ICD-10-CM | POA: Diagnosis not present

## 2014-12-18 DIAGNOSIS — J45909 Unspecified asthma, uncomplicated: Secondary | ICD-10-CM | POA: Diagnosis not present

## 2014-12-18 DIAGNOSIS — I10 Essential (primary) hypertension: Secondary | ICD-10-CM | POA: Diagnosis not present

## 2014-12-18 DIAGNOSIS — E1142 Type 2 diabetes mellitus with diabetic polyneuropathy: Secondary | ICD-10-CM | POA: Diagnosis not present

## 2014-12-18 DIAGNOSIS — F331 Major depressive disorder, recurrent, moderate: Secondary | ICD-10-CM | POA: Diagnosis not present

## 2014-12-18 DIAGNOSIS — M545 Low back pain: Secondary | ICD-10-CM | POA: Diagnosis not present

## 2014-12-18 DIAGNOSIS — Z794 Long term (current) use of insulin: Secondary | ICD-10-CM | POA: Diagnosis not present

## 2014-12-18 DIAGNOSIS — Z87891 Personal history of nicotine dependence: Secondary | ICD-10-CM | POA: Diagnosis not present

## 2014-12-18 DIAGNOSIS — M179 Osteoarthritis of knee, unspecified: Secondary | ICD-10-CM | POA: Diagnosis not present

## 2014-12-18 DIAGNOSIS — G8929 Other chronic pain: Secondary | ICD-10-CM | POA: Diagnosis not present

## 2014-12-18 DIAGNOSIS — Z79891 Long term (current) use of opiate analgesic: Secondary | ICD-10-CM | POA: Diagnosis not present

## 2014-12-18 DIAGNOSIS — Z6841 Body Mass Index (BMI) 40.0 and over, adult: Secondary | ICD-10-CM | POA: Diagnosis not present

## 2014-12-18 DIAGNOSIS — M6281 Muscle weakness (generalized): Secondary | ICD-10-CM | POA: Diagnosis not present

## 2014-12-19 DIAGNOSIS — I10 Essential (primary) hypertension: Secondary | ICD-10-CM | POA: Diagnosis not present

## 2014-12-19 DIAGNOSIS — G8929 Other chronic pain: Secondary | ICD-10-CM | POA: Diagnosis not present

## 2014-12-19 DIAGNOSIS — M545 Low back pain: Secondary | ICD-10-CM | POA: Diagnosis not present

## 2014-12-19 DIAGNOSIS — J45909 Unspecified asthma, uncomplicated: Secondary | ICD-10-CM | POA: Diagnosis not present

## 2014-12-19 DIAGNOSIS — M6281 Muscle weakness (generalized): Secondary | ICD-10-CM | POA: Diagnosis not present

## 2014-12-19 DIAGNOSIS — F331 Major depressive disorder, recurrent, moderate: Secondary | ICD-10-CM | POA: Diagnosis not present

## 2014-12-19 DIAGNOSIS — Z79891 Long term (current) use of opiate analgesic: Secondary | ICD-10-CM | POA: Diagnosis not present

## 2014-12-19 DIAGNOSIS — Z794 Long term (current) use of insulin: Secondary | ICD-10-CM | POA: Diagnosis not present

## 2014-12-19 DIAGNOSIS — E1142 Type 2 diabetes mellitus with diabetic polyneuropathy: Secondary | ICD-10-CM | POA: Diagnosis not present

## 2014-12-19 DIAGNOSIS — M179 Osteoarthritis of knee, unspecified: Secondary | ICD-10-CM | POA: Diagnosis not present

## 2014-12-19 DIAGNOSIS — Z6841 Body Mass Index (BMI) 40.0 and over, adult: Secondary | ICD-10-CM | POA: Diagnosis not present

## 2014-12-19 DIAGNOSIS — Z87891 Personal history of nicotine dependence: Secondary | ICD-10-CM | POA: Diagnosis not present

## 2014-12-22 DIAGNOSIS — E1142 Type 2 diabetes mellitus with diabetic polyneuropathy: Secondary | ICD-10-CM | POA: Diagnosis not present

## 2014-12-22 DIAGNOSIS — M6281 Muscle weakness (generalized): Secondary | ICD-10-CM | POA: Diagnosis not present

## 2014-12-22 DIAGNOSIS — Z87891 Personal history of nicotine dependence: Secondary | ICD-10-CM | POA: Diagnosis not present

## 2014-12-22 DIAGNOSIS — M545 Low back pain: Secondary | ICD-10-CM | POA: Diagnosis not present

## 2014-12-22 DIAGNOSIS — I10 Essential (primary) hypertension: Secondary | ICD-10-CM | POA: Diagnosis not present

## 2014-12-22 DIAGNOSIS — G8929 Other chronic pain: Secondary | ICD-10-CM | POA: Diagnosis not present

## 2014-12-22 DIAGNOSIS — J45909 Unspecified asthma, uncomplicated: Secondary | ICD-10-CM | POA: Diagnosis not present

## 2014-12-22 DIAGNOSIS — F331 Major depressive disorder, recurrent, moderate: Secondary | ICD-10-CM | POA: Diagnosis not present

## 2014-12-22 DIAGNOSIS — M179 Osteoarthritis of knee, unspecified: Secondary | ICD-10-CM | POA: Diagnosis not present

## 2014-12-22 DIAGNOSIS — Z79891 Long term (current) use of opiate analgesic: Secondary | ICD-10-CM | POA: Diagnosis not present

## 2014-12-22 DIAGNOSIS — Z6841 Body Mass Index (BMI) 40.0 and over, adult: Secondary | ICD-10-CM | POA: Diagnosis not present

## 2014-12-22 DIAGNOSIS — Z794 Long term (current) use of insulin: Secondary | ICD-10-CM | POA: Diagnosis not present

## 2014-12-28 DIAGNOSIS — Z6841 Body Mass Index (BMI) 40.0 and over, adult: Secondary | ICD-10-CM | POA: Diagnosis not present

## 2014-12-28 DIAGNOSIS — I10 Essential (primary) hypertension: Secondary | ICD-10-CM | POA: Diagnosis not present

## 2014-12-28 DIAGNOSIS — Z79891 Long term (current) use of opiate analgesic: Secondary | ICD-10-CM | POA: Diagnosis not present

## 2014-12-28 DIAGNOSIS — M6281 Muscle weakness (generalized): Secondary | ICD-10-CM | POA: Diagnosis not present

## 2014-12-28 DIAGNOSIS — Z794 Long term (current) use of insulin: Secondary | ICD-10-CM | POA: Diagnosis not present

## 2014-12-28 DIAGNOSIS — M179 Osteoarthritis of knee, unspecified: Secondary | ICD-10-CM | POA: Diagnosis not present

## 2014-12-28 DIAGNOSIS — F331 Major depressive disorder, recurrent, moderate: Secondary | ICD-10-CM | POA: Diagnosis not present

## 2014-12-28 DIAGNOSIS — G8929 Other chronic pain: Secondary | ICD-10-CM | POA: Diagnosis not present

## 2014-12-28 DIAGNOSIS — M545 Low back pain: Secondary | ICD-10-CM | POA: Diagnosis not present

## 2014-12-28 DIAGNOSIS — J45909 Unspecified asthma, uncomplicated: Secondary | ICD-10-CM | POA: Diagnosis not present

## 2014-12-28 DIAGNOSIS — E1142 Type 2 diabetes mellitus with diabetic polyneuropathy: Secondary | ICD-10-CM | POA: Diagnosis not present

## 2014-12-28 DIAGNOSIS — Z87891 Personal history of nicotine dependence: Secondary | ICD-10-CM | POA: Diagnosis not present

## 2014-12-29 ENCOUNTER — Other Ambulatory Visit: Payer: 59

## 2014-12-29 DIAGNOSIS — E1142 Type 2 diabetes mellitus with diabetic polyneuropathy: Secondary | ICD-10-CM | POA: Diagnosis not present

## 2014-12-29 DIAGNOSIS — M545 Low back pain: Secondary | ICD-10-CM | POA: Diagnosis not present

## 2014-12-29 DIAGNOSIS — G8929 Other chronic pain: Secondary | ICD-10-CM | POA: Diagnosis not present

## 2014-12-29 DIAGNOSIS — Z6841 Body Mass Index (BMI) 40.0 and over, adult: Secondary | ICD-10-CM | POA: Diagnosis not present

## 2014-12-29 DIAGNOSIS — M6281 Muscle weakness (generalized): Secondary | ICD-10-CM | POA: Diagnosis not present

## 2014-12-29 DIAGNOSIS — M179 Osteoarthritis of knee, unspecified: Secondary | ICD-10-CM | POA: Diagnosis not present

## 2014-12-29 DIAGNOSIS — J45909 Unspecified asthma, uncomplicated: Secondary | ICD-10-CM | POA: Diagnosis not present

## 2014-12-29 DIAGNOSIS — I10 Essential (primary) hypertension: Secondary | ICD-10-CM | POA: Diagnosis not present

## 2014-12-29 DIAGNOSIS — Z87891 Personal history of nicotine dependence: Secondary | ICD-10-CM | POA: Diagnosis not present

## 2014-12-29 DIAGNOSIS — F331 Major depressive disorder, recurrent, moderate: Secondary | ICD-10-CM | POA: Diagnosis not present

## 2014-12-29 DIAGNOSIS — Z79891 Long term (current) use of opiate analgesic: Secondary | ICD-10-CM | POA: Diagnosis not present

## 2014-12-29 DIAGNOSIS — Z794 Long term (current) use of insulin: Secondary | ICD-10-CM | POA: Diagnosis not present

## 2015-01-01 ENCOUNTER — Encounter: Payer: Medicare Other | Admitting: Oncology

## 2015-01-01 ENCOUNTER — Telehealth: Payer: Self-pay | Admitting: Oncology

## 2015-01-01 ENCOUNTER — Other Ambulatory Visit: Payer: Medicare Other

## 2015-01-01 NOTE — Telephone Encounter (Signed)
Patient called in as she was having car trouble and we have rescheduled her appointment
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
## 2015-01-05 ENCOUNTER — Other Ambulatory Visit: Payer: Self-pay | Admitting: Oncology

## 2015-01-05 ENCOUNTER — Other Ambulatory Visit: Payer: 59

## 2015-01-19 ENCOUNTER — Other Ambulatory Visit: Payer: 59

## 2015-01-19 ENCOUNTER — Ambulatory Visit (HOSPITAL_COMMUNITY): Payer: Self-pay

## 2015-01-19 DIAGNOSIS — I10 Essential (primary) hypertension: Secondary | ICD-10-CM | POA: Diagnosis not present

## 2015-01-19 DIAGNOSIS — E1122 Type 2 diabetes mellitus with diabetic chronic kidney disease: Secondary | ICD-10-CM | POA: Diagnosis not present

## 2015-01-19 DIAGNOSIS — E039 Hypothyroidism, unspecified: Secondary | ICD-10-CM | POA: Diagnosis not present

## 2015-01-19 DIAGNOSIS — I129 Hypertensive chronic kidney disease with stage 1 through stage 4 chronic kidney disease, or unspecified chronic kidney disease: Secondary | ICD-10-CM | POA: Diagnosis not present

## 2015-01-19 DIAGNOSIS — H538 Other visual disturbances: Secondary | ICD-10-CM | POA: Diagnosis not present

## 2015-01-19 DIAGNOSIS — N183 Chronic kidney disease, stage 3 (moderate): Secondary | ICD-10-CM | POA: Diagnosis not present

## 2015-01-19 DIAGNOSIS — N08 Glomerular disorders in diseases classified elsewhere: Secondary | ICD-10-CM | POA: Diagnosis not present

## 2015-02-03 IMAGING — CT CT CHEST W/O CM
1 of 4 series · 3 of 36 positions shown, 4 images · non-contrast
Comparison: Multiple exams, including 02/19/2013

CLINICAL DATA: Substernal chest pain.  Hypotension.  Leukocytosis.

EXAM:
CT CHEST WITHOUT CONTRAST
TECHNIQUE: Multidetector CT imaging of the chest was performed following the
standard protocol without IV contrast..

[Series 204: cor · coronal · 0.50mm/px · 3 of 142 slices shown, 4 images]
[im 29/142  mediastinal]
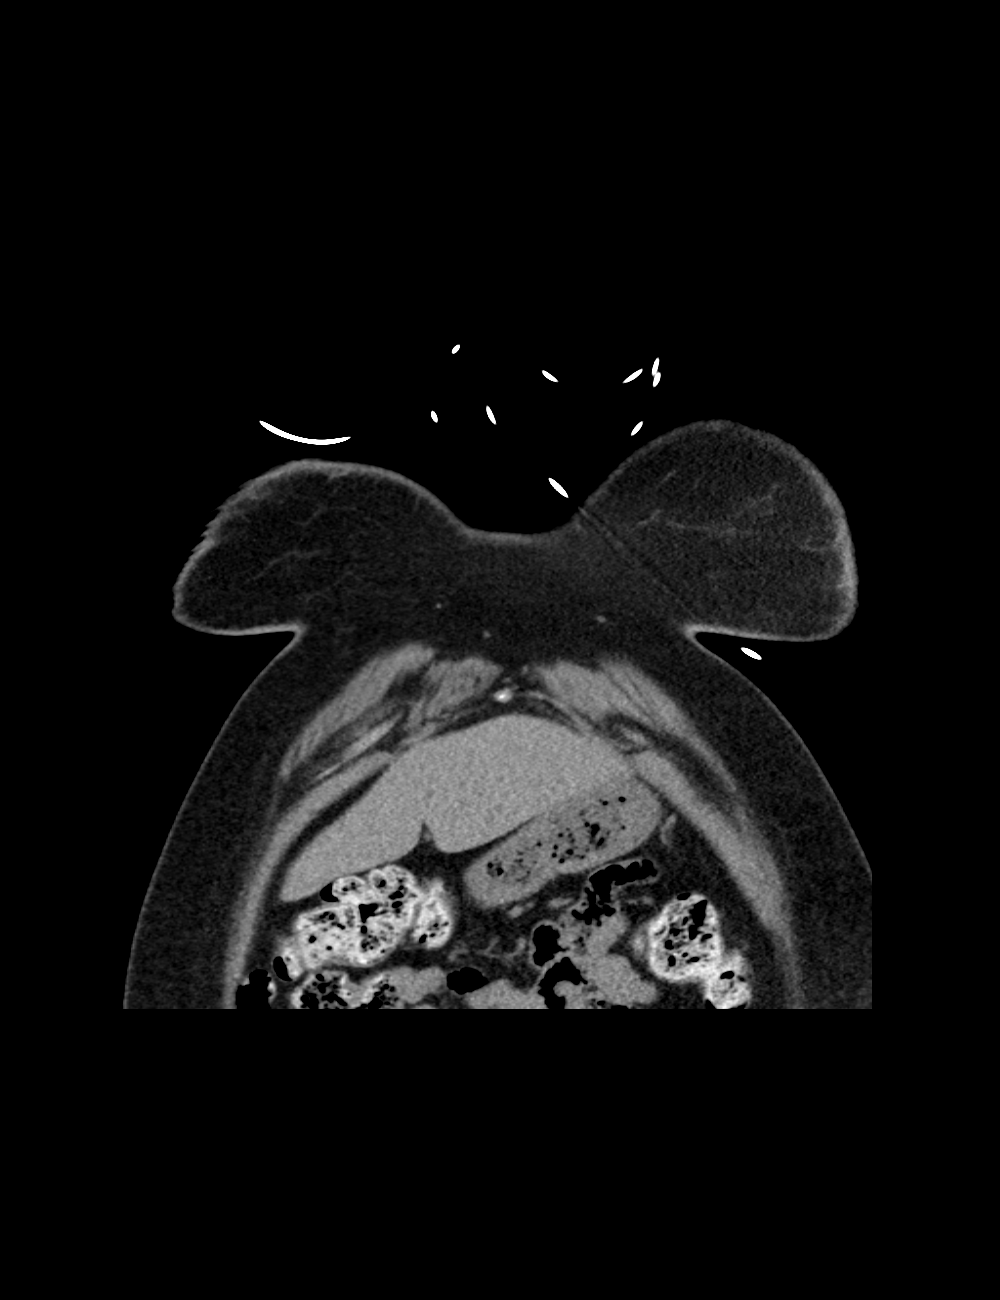
[im 29/142  lung]
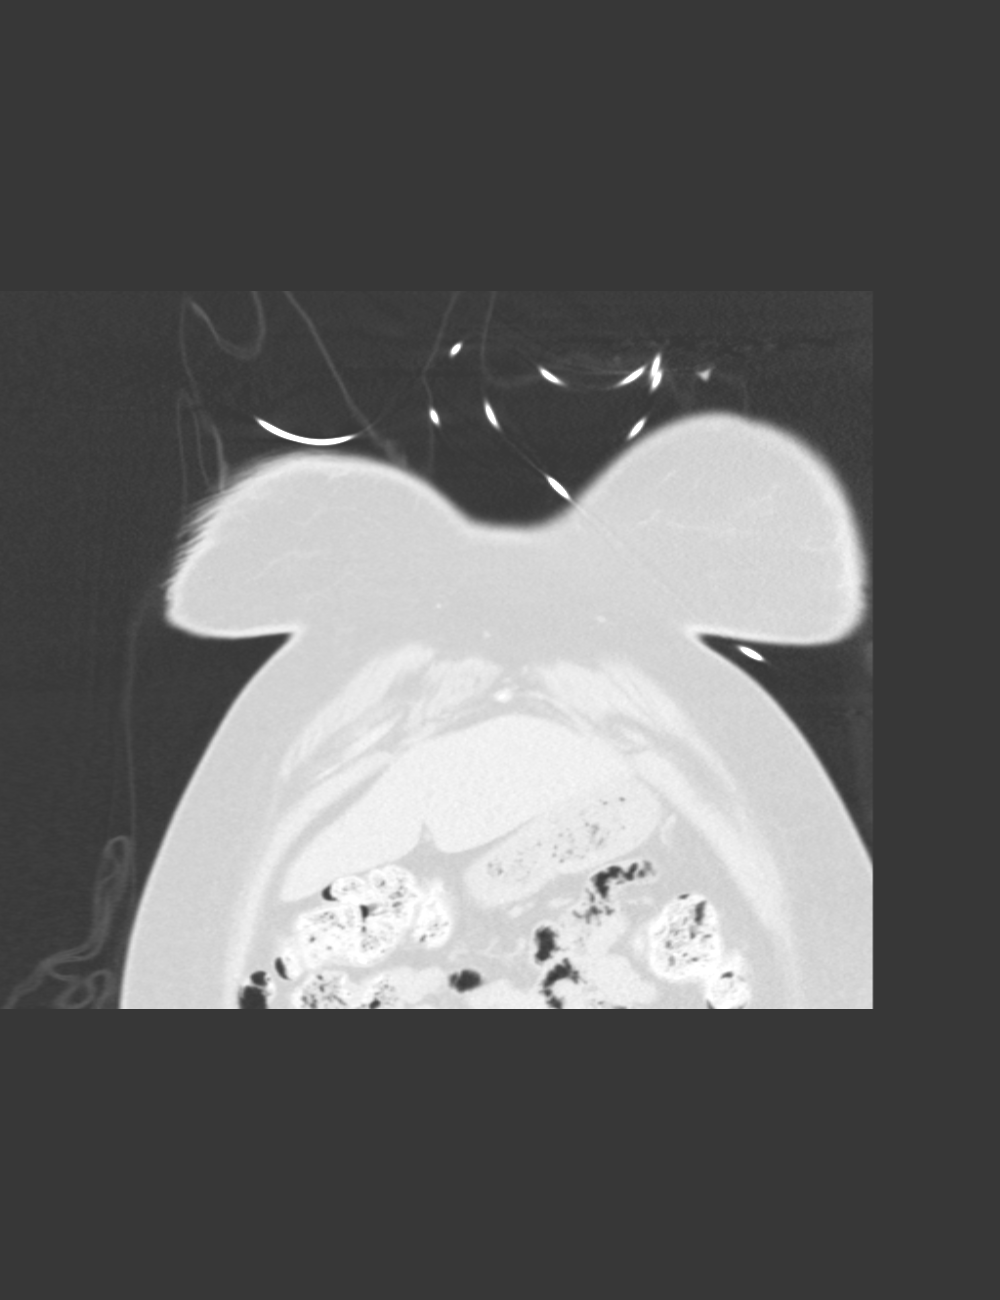
[im 85/142  lung]
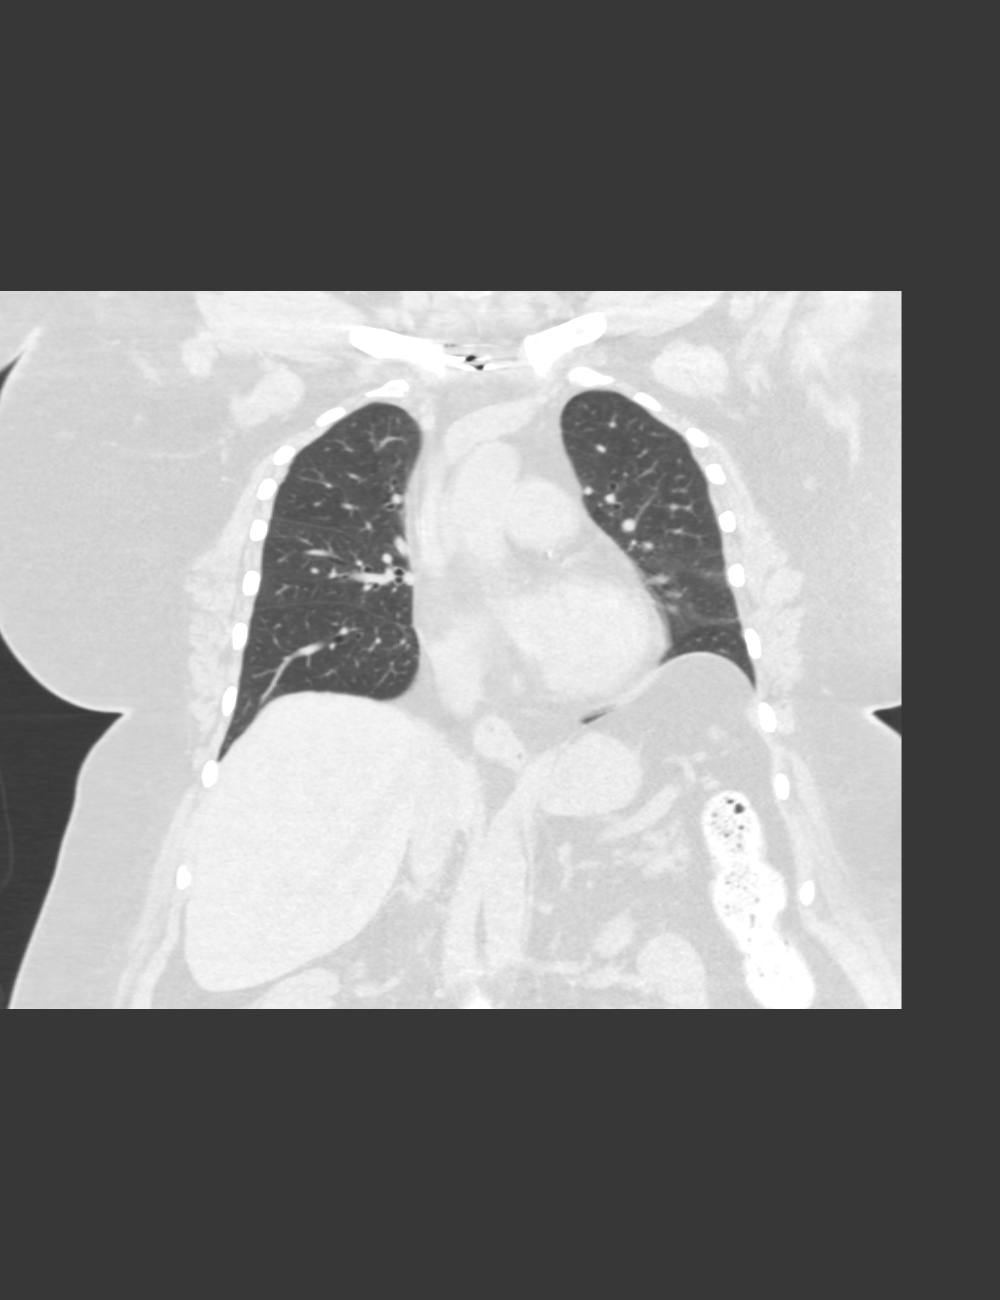
[im 113/142  lung]
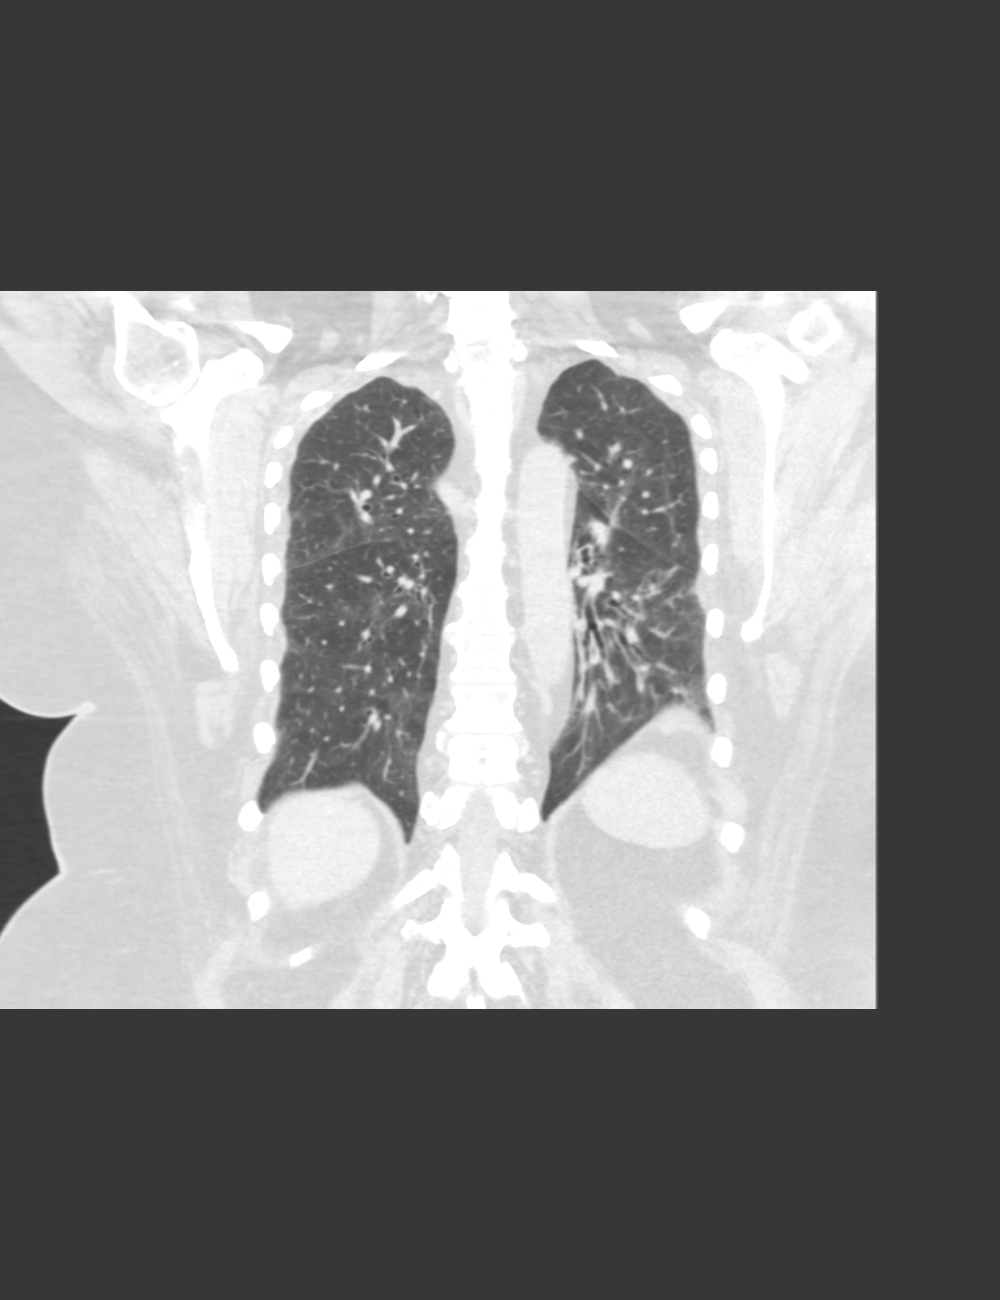

[3 of 36 positions shown; findings below may reference images not displayed]

FINDINGS: No pathologic thoracic adenopathy. Dependent subsegmental
atelectasis in both lungs. No acute thoracic spine findings.
Coronary atherosclerotic calcification noted. A right central line
terminates in the SVC.
IMPRESSION: 1. Dependent subsegmental atelectasis in both lungs.
2. Coronary atherosclerosis.

## 2015-02-03 IMAGING — CR DG CHEST 1V PORT
1 series · 1 of 1 positions shown · non-contrast
Comparison: Two-view chest in 02/18/2013

CLINICAL DATA: SHORTNESS OF BREATH CHEST PAIN

EXAM:
PORTABLE CHEST - 1 VIEW

[AP]
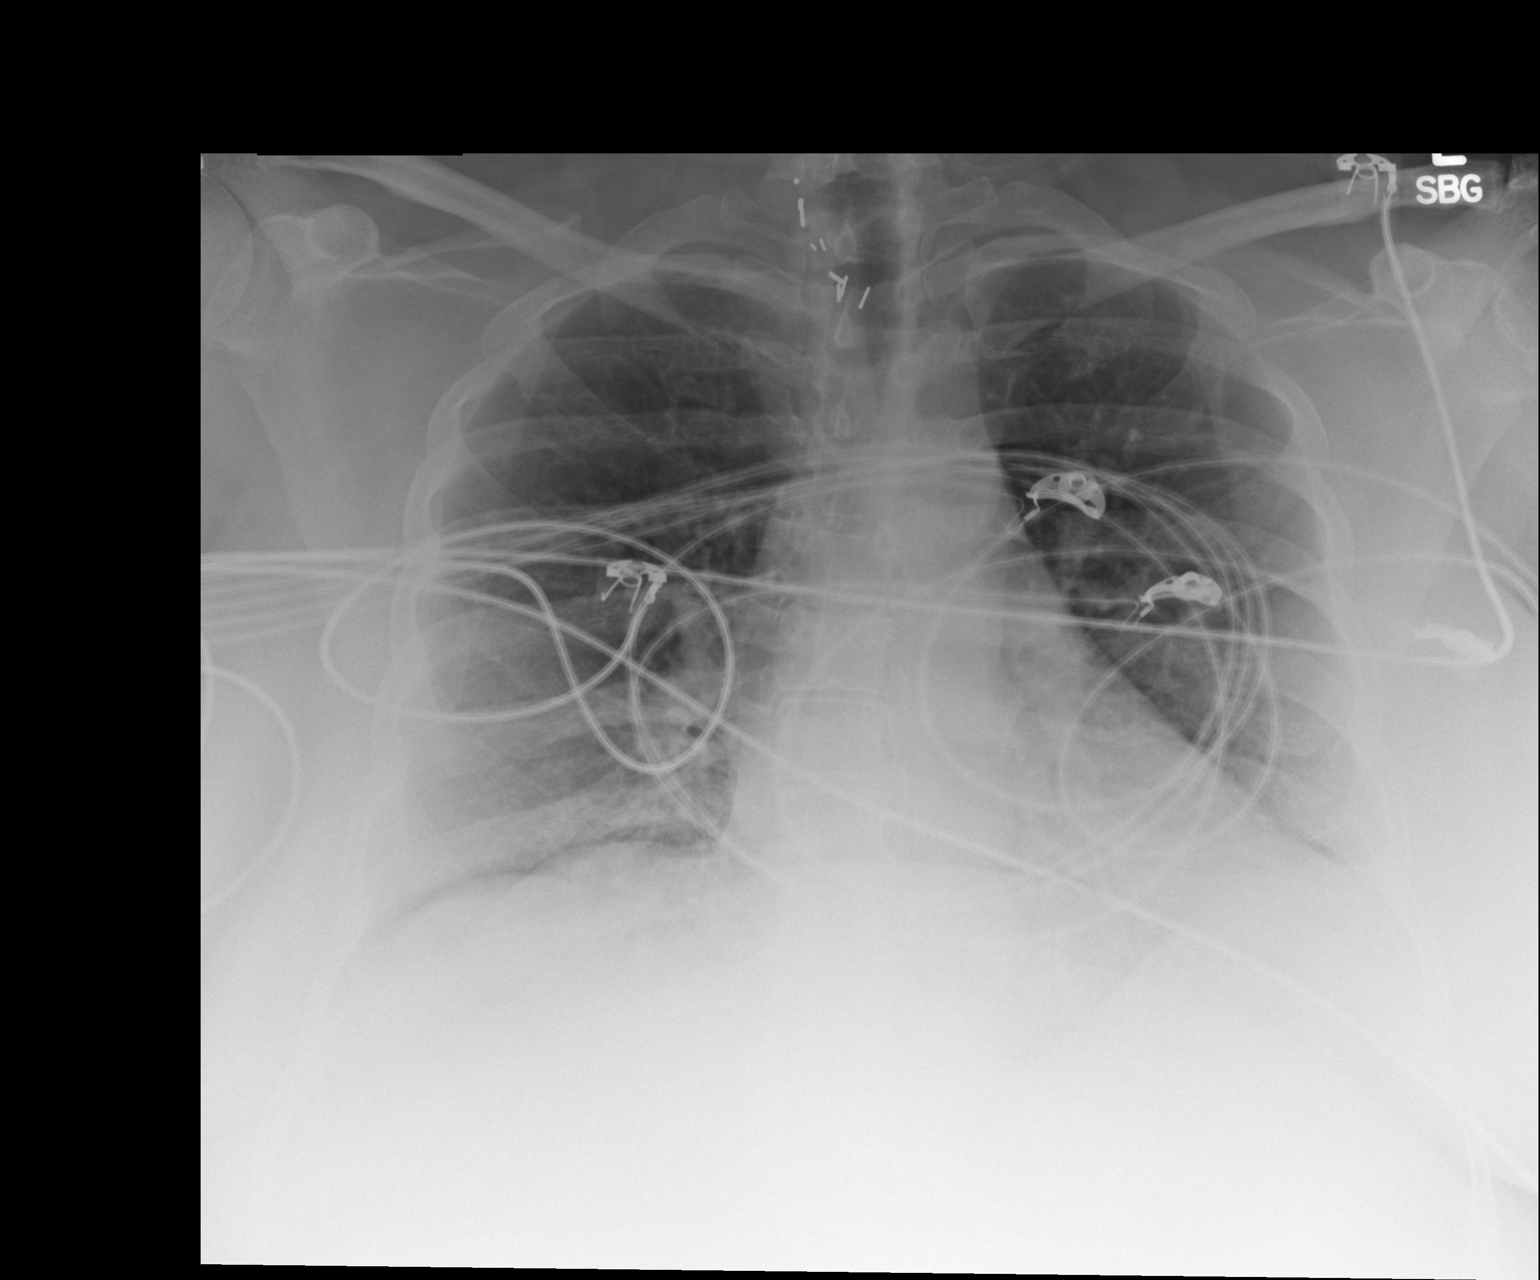

[1 of 1 positions shown; findings below may reference images not displayed]

FINDINGS: Low lung volumes. The heart size and mediastinal contours are within
normal limits. Both lungs are clear. The visualized skeletal
structures are unremarkable.
IMPRESSION: No active disease.

## 2015-02-05 IMAGING — CR DG CHEST 1V PORT
1 series · 1 of 1 positions shown · non-contrast
Comparison: 08/08/2013

CLINICAL DATA: Assess for airspace disease

EXAM:
PORTABLE CHEST - 1 VIEW

[AP]
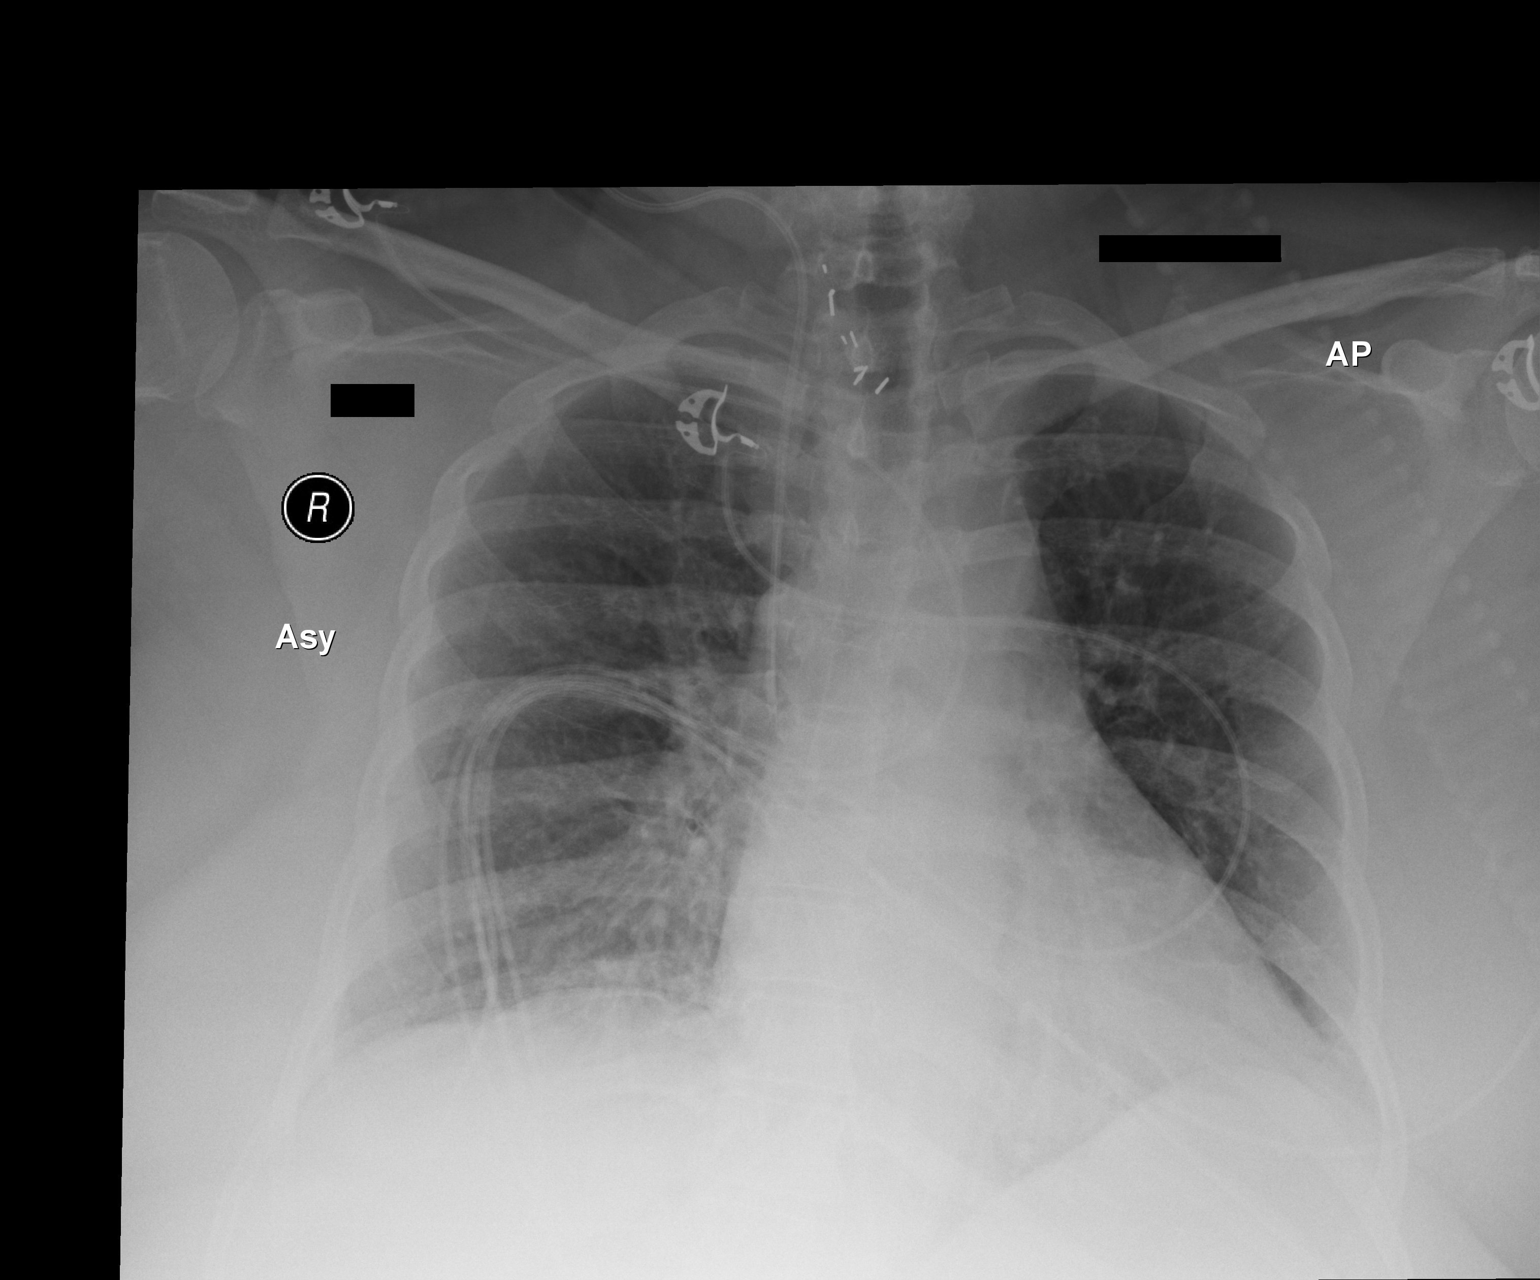

[1 of 1 positions shown; findings below may reference images not displayed]

FINDINGS: Right jugular central venous catheter tip in the SVC is unchanged.
Surgical clips in the right thyroid region.

Prominent heart size. Mild right lower lobe atelectasis. Negative
for edema or effusion.
IMPRESSION: Mild right lower lobe atelectasis.

## 2015-02-23 DIAGNOSIS — F411 Generalized anxiety disorder: Secondary | ICD-10-CM | POA: Diagnosis not present

## 2015-02-23 DIAGNOSIS — F3132 Bipolar disorder, current episode depressed, moderate: Secondary | ICD-10-CM | POA: Diagnosis not present

## 2015-02-26 ENCOUNTER — Other Ambulatory Visit: Payer: Medicare Other

## 2015-02-26 ENCOUNTER — Ambulatory Visit: Payer: Medicare Other | Admitting: Oncology

## 2015-02-26 ENCOUNTER — Telehealth: Payer: Self-pay | Admitting: Oncology

## 2015-02-26 NOTE — Telephone Encounter (Signed)
Patient called in and rescheduled her appointments due to weather  [name]

## 2015-03-19 ENCOUNTER — Encounter: Payer: Self-pay | Admitting: *Deleted

## 2015-03-19 ENCOUNTER — Other Ambulatory Visit: Payer: Self-pay

## 2015-03-19 DIAGNOSIS — M5137 Other intervertebral disc degeneration, lumbosacral region: Secondary | ICD-10-CM | POA: Diagnosis not present

## 2015-03-19 DIAGNOSIS — Z79899 Other long term (current) drug therapy: Secondary | ICD-10-CM | POA: Diagnosis not present

## 2015-03-19 DIAGNOSIS — M47817 Spondylosis without myelopathy or radiculopathy, lumbosacral region: Secondary | ICD-10-CM | POA: Diagnosis not present

## 2015-03-19 DIAGNOSIS — Z599 Problem related to housing and economic circumstances, unspecified: Secondary | ICD-10-CM

## 2015-03-19 DIAGNOSIS — M5442 Lumbago with sciatica, left side: Secondary | ICD-10-CM

## 2015-03-19 DIAGNOSIS — G894 Chronic pain syndrome: Secondary | ICD-10-CM | POA: Diagnosis not present

## 2015-03-19 DIAGNOSIS — M5441 Lumbago with sciatica, right side: Secondary | ICD-10-CM

## 2015-03-19 DIAGNOSIS — R296 Repeated falls: Secondary | ICD-10-CM
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
## 2015-03-20 NOTE — Patient Outreach (Signed)
[facility]) Care Management  03/20/2015  [name] September 05, 1955 [id number]  Request received from [facility], LCSW to provide patient with [name] transportation application. Information mailed today, 03/20/15.   [name]  [facility] Care Management Assistant

## 2015-03-21 ENCOUNTER — Other Ambulatory Visit: Payer: Self-pay

## 2015-03-21 NOTE — Patient Outreach (Signed)
[facility]) Care Management  03/21/2015  [name] 07/21/1955 [id number]   SUBJECTIVE: HIPAA verified with patient. Patient inquiring about assistance with transportation. RNCM explained to patient that [facility] care management social worker would contact her to discuss her transportation concerns and [name] application. Patient verbalized understanding.   PLAN;  No additional follow up needed from this RNCM. Patient has been referred to [facility] social worker.   Quinn Plowman RN,BSN,CCM [facility] Telephonic  [phone]

## 2015-03-26 ENCOUNTER — Other Ambulatory Visit (HOSPITAL_BASED_OUTPATIENT_CLINIC_OR_DEPARTMENT_OTHER): Payer: Medicare Other

## 2015-03-26 ENCOUNTER — Other Ambulatory Visit: Payer: Self-pay | Admitting: *Deleted

## 2015-03-26 ENCOUNTER — Telehealth: Payer: Self-pay | Admitting: Nurse Practitioner

## 2015-03-26 ENCOUNTER — Ambulatory Visit (HOSPITAL_BASED_OUTPATIENT_CLINIC_OR_DEPARTMENT_OTHER): Payer: Medicare Other | Admitting: Oncology

## 2015-03-26 VITALS — BP 137/83 | HR 98 | Temp 98.0°F | Resp 20 | Ht 65.0 in | Wt 311.1 lb

## 2015-03-26 DIAGNOSIS — E119 Type 2 diabetes mellitus without complications: Secondary | ICD-10-CM

## 2015-03-26 DIAGNOSIS — C50411 Malignant neoplasm of upper-outer quadrant of right female breast: Secondary | ICD-10-CM

## 2015-03-26 DIAGNOSIS — Z794 Long term (current) use of insulin: Secondary | ICD-10-CM

## 2015-03-26 DIAGNOSIS — Z170 Estrogen receptor positive status [ER+]: Secondary | ICD-10-CM

## 2015-03-26 DIAGNOSIS — C50911 Malignant neoplasm of unspecified site of right female breast: Secondary | ICD-10-CM

## 2015-03-26 DIAGNOSIS — D0511 Intraductal carcinoma in situ of right breast: Secondary | ICD-10-CM | POA: Diagnosis not present

## 2015-03-26 DIAGNOSIS — E1142 Type 2 diabetes mellitus with diabetic polyneuropathy: Secondary | ICD-10-CM

## 2015-03-26 LAB — CBC WITH DIFFERENTIAL/PLATELET
BASO%: 0.9 % (ref 0.0–2.0)
Basophils Absolute: 0.1 10*3/uL (ref 0.0–0.1)
EOS ABS: 0.1 10*3/uL (ref 0.0–0.5)
EOS%: 1.1 % (ref 0.0–7.0)
HCT: 34.9 % (ref 34.8–46.6)
HGB: 10.8 g/dL — ABNORMAL LOW (ref 11.6–15.9)
LYMPH%: 16.3 % (ref 14.0–49.7)
MCH: 26.3 pg (ref 25.1–34.0)
MCHC: 31 g/dL — ABNORMAL LOW (ref 31.5–36.0)
MCV: 84.6 fL (ref 79.5–101.0)
MONO#: 0.7 10*3/uL (ref 0.1–0.9)
MONO%: 5.2 % (ref 0.0–14.0)
NEUT%: 76.5 % (ref 38.4–76.8)
NEUTROS ABS: 10.6 10*3/uL — AB (ref 1.5–6.5)
Platelets: 284 10*3/uL (ref 145–400)
RBC: 4.13 10*6/uL (ref 3.70–5.45)
RDW: 16.5 % — AB (ref 11.2–14.5)
WBC: 13.8 10*3/uL — AB (ref 3.9–10.3)
lymph#: 2.2 10*3/uL (ref 0.9–3.3)

## 2015-03-26 LAB — COMPREHENSIVE METABOLIC PANEL
ALBUMIN: 3.2 g/dL — AB (ref 3.5–5.0)
ALK PHOS: 84 U/L (ref 40–150)
ALT: 18 U/L (ref 0–55)
AST: 13 U/L (ref 5–34)
Anion Gap: 11 mEq/L (ref 3–11)
BUN: 30.8 mg/dL — AB (ref 7.0–26.0)
CO2: 20 meq/L — AB (ref 22–29)
CREATININE: 1.1 mg/dL (ref 0.6–1.1)
Calcium: 9 mg/dL (ref 8.4–10.4)
Chloride: 106 mEq/L (ref 98–109)
EGFR: 64 mL/min/{1.73_m2} — ABNORMAL LOW (ref >90)
GLUCOSE: 124 mg/dL (ref 70–140)
Potassium: 4.4 mEq/L (ref 3.5–5.1)
SODIUM: 137 meq/L (ref 136–145)
TOTAL PROTEIN: 6.9 g/dL (ref 6.4–8.3)

## 2015-03-26 NOTE — Patient Outreach (Signed)
[facility]) Care Management  03/26/2015  [name] 11/15/1955 [id number]  CSW received a new referral on patient from Quinn Plowman, Telephonic Nurse Case Manager with [facility], indicating that patient is requesting additional transportation resources to get to and from her physician appointments. CSW made an initial attempt to try and contact patient today to perform phone assessment, as well as assess and assist with social needs and services, without success.  A HIPAA complaint message was left for patient on voicemail.  CSW is currently awaiting a return call.  [name], BSW, MSW, LCSW  Licensed Education officer, [facility]  Mailing [place] [address] [place], [place] [address] Physical Address-[address], [place], [place] [address] Toll Free Main # [phone] Fax # [phone] Cell # [phone]  Fax # [phone]  [email or web address]    [facility] complies with [employer] civil rights laws and does not discriminate on the basis of race, color, national origin, age, disability, or sex.  Espaol (Spanish)  [facility] cumple con las leyes federales de derechos civiles aplicables y no discrimina por motivos de raza, color, nacionalidad, edad, discapacidad o sexo.     Ti?ng Vi?t ([place])  [place] tun th? lu?t dn quy?n hi?n hnh c?a Lin bang v khng phn bi?t ?i x? d?a trn ch?ng t?c, mu da, ngu?n g?c qu?c gia, ? tu?i, khuy?t t?t, ho?c gi?i tnh.     (Arabic)    [place]

## 2015-03-26 NOTE — Telephone Encounter (Signed)
Appointments made and avs printed
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
## 2015-03-27 ENCOUNTER — Other Ambulatory Visit: Payer: Self-pay | Admitting: *Deleted

## 2015-03-27 NOTE — Patient Outreach (Addendum)
[facility]) Care Management  03/27/2015  [name] [birth year] [id number]   Referral [place]  RN spoke with pt today and introduced the [facility] program and available services. Pt receptive to the call and expressed her needs. Pt states since her last conversation with [facility] she has had several things to occur. States concerning social work needs her help in the home she has an aide that currently is in one hour a day but she has an Art therapist doctor who will complete the necessary forms to increase her hours once evaluated. Another need was transportation. Due pt having [facility] RN inquired on the transportation services offered by [facility] services. Pt states she was not pleased when involved with this services before. Indicated no steps were available to get in and outside the vehicle and it was usually a car ride which was not as comfort and she was in pain due to her medical issues. States since that time she is applying or [name] and her doctor is completing that form also for additional services. RN inquired on pharmacy needs as pt states he is managing her medications with no problems and understands all her medications and does not need assistance in the area. RN inquired on other needs as pt indicated a wheelchair and 3-1 commode to shorten the distance to her bathroom during the night may help. Due ot her upcoming appointment with her provider RN encouraged pt to request a prescription be sent to a home health agency of her choice based upon the agency having DME equipment for this request. RN encouraged pt to inquire with the agency prior to delivery or pick up of this item on any co-pays or deductible that must be paid prior to the equipment. If there is any issues or problem pt aware to contact [facility] for possible assistance.   RN further inquired on pt's medical issues concerning her falls. Pt states she has a condition where she may collaspe due to the nerves in  the legs "may go out". States her orthopedic doctor is aware and has informed her that there is no surgery that can fix this problem. Therefore pt consult the pain management doctor on a regular bases.  RN offered to consult pt further with a possible home visit for a home safety evaluation to see if there is any assistance [facility] case management service can assist with in the home as a prevention measure in keeping her safe. Pt receptive and a home visit was scheduled based upon pt's request next Tuesday. Will follow up at that time and verified pt has RN contact number if needed sooner.   Due to the above services RN will update [name], Pharmacy with [place] the social worker to hold consults unless otherwise requested due to pt's pending status with the above applications for transportation, PCS services and awareness of her medications with good management of her daily medications.  Tammie Mina, RN Care Management Coordinator [place] Office [phone]

## 2015-03-28 ENCOUNTER — Other Ambulatory Visit: Payer: Self-pay | Admitting: *Deleted

## 2015-03-28 NOTE — Patient Outreach (Signed)
[facility]) Care Perez  03/28/2015  [name] March 18, 1955 [id number]   CSW received a call from patient's RNCM with [place] Perez, [name] reporting that she was able to perform an initial phone assessment with patient and that no social work needs have been identified at present.  [name] went on to say that patient currently has Adult Medicaid and is able to utilize Adult [employer] and/or Adult Taxi, whenever transportation to and from physician appointments are necessary.  Patient has also completed a [name] Paramedic) application with the [employer], which is awaiting approval and signature from patient's Primary Care Physician, Dr. Glendale Perez.  Patient reports that she already has an appointment scheduled with Dr. Baird Perez, at which time, she will have her [name] application completed and signed.  Once signed, patient's application will be ready for submission and possible approval. In addition, patient was requesting additional hours of home care services through her PCS Youth worker) Worker with [employer] through Adult Medicaid with the [place].  Patient reports that she is currently only receiving one hour of home care services per day, which is simply not enough to perform all activities of daily living.  Patient's application for [employer] to increase her daily hours of home care services is also waiting in Dr. Baird Perez mailbox, pending patient's office visit.  [name] agreed to notify CSW if social work needs arise while she is providing disease Perez services to patient.  Otherwise, [name] encouraged CSW to close patient's case at present. CSW will perform a case closure on patient, as all goals of treatment have been met from social work standpoint and  no additional social work needs have been identified at this time. CSW will notify patient's RNCM with [name], [name] of CSW's plans to close patient's case. CSW will fax a correspondence letter to patient's Primary Care Physician, Dr. Glendale Perez to ensure that Dr. is aware of CSW's plans to close patient's case.   CSW will submit a case closure request to [name], Care Perez Assistant with [place] Perez, in the form of an [employer].  CSW will ensure that [name] is aware of [name], RNCM with [place] Perez, continued involvement with patient's care.  [name], BSW, MSW, LCSW  Licensed Education officer, environmental Health System  Mailing [place] [address], [place], [place] [address] Physical Address-[address], [place], [place] [address] Toll Free Main # [phone] Fax # [phone] Cell # [phone]  Fax # [phone]  [name]    [facility] complies with [employer] civil rights laws and does not discriminate on the basis of race, color, national origin, age, disability, or sex.  Espaol (Spanish)  [facility] cumple con las leyes federales de derechos civiles aplicables y no discrimina por motivos de raza, color, nacionalidad, edad, discapacidad o sexo.     Ti?ng Vi?t ([place])  [place] [facility] tun th? lu?t dn quy?n hi?n hnh c?a Lin bang v khng phn bi?t ?i x? d?a trn ch?ng t?c, mu da, ngu?n g?c qu?c gia, ? tu?i, khuy?t t?t, ho?c gi?i tnh.     (Arabic)    [place]

## 2015-03-30 DIAGNOSIS — Z885 Allergy status to narcotic agent status: Secondary | ICD-10-CM | POA: Diagnosis not present

## 2015-03-30 DIAGNOSIS — H35033 Hypertensive retinopathy, bilateral: Secondary | ICD-10-CM | POA: Diagnosis not present

## 2015-03-30 DIAGNOSIS — E1165 Type 2 diabetes mellitus with hyperglycemia: Secondary | ICD-10-CM | POA: Diagnosis not present

## 2015-03-30 DIAGNOSIS — H4710 Unspecified papilledema: Secondary | ICD-10-CM | POA: Diagnosis not present

## 2015-03-30 DIAGNOSIS — Z794 Long term (current) use of insulin: Secondary | ICD-10-CM | POA: Diagnosis not present

## 2015-03-30 DIAGNOSIS — H47393 Other disorders of optic disc, bilateral: Secondary | ICD-10-CM | POA: Diagnosis not present

## 2015-03-30 DIAGNOSIS — Z87891 Personal history of nicotine dependence: Secondary | ICD-10-CM | POA: Diagnosis not present

## 2015-03-30 DIAGNOSIS — E119 Type 2 diabetes mellitus without complications: Secondary | ICD-10-CM | POA: Diagnosis not present

## 2015-03-30 DIAGNOSIS — I10 Essential (primary) hypertension: Secondary | ICD-10-CM | POA: Diagnosis not present

## 2015-04-03 ENCOUNTER — Other Ambulatory Visit: Payer: Self-pay | Admitting: *Deleted

## 2015-04-03 VITALS — BP 132/80 | HR 93 | Resp 20 | Ht 65.0 in | Wt 262.0 lb

## 2015-04-03 DIAGNOSIS — I10 Essential (primary) hypertension: Secondary | ICD-10-CM

## 2015-04-03 NOTE — Patient Outreach (Addendum)
[facility]) Care Management   04/03/2015  [name] Aug 17, 1955 [id number]  [name] is an 60 y.o. female  Subjective:  THN: Pt express an interested in enrolling into the [facility] program for education and services. DM and HTN: Pt reports she is managing both her diabetes with stable blood sugars when taken and reports no problems. Pt states concerning her BP she was elevaluated on a doctor's visit due to her chronic back pain at 187/92 on her doctor's visit but this had improved later that day when she obtain another reading with her home device. Pt confirms that she is able to recognize hyper-hypotension and obtains her BP daily sometimes several times a day. No problems as pt states she takes the prescribed medications for both of these medical conditions.  SAFETY: Pt states she has a back condition were there is no operation available. States a disk in her back continues to hit a nerve causing her legs to buckle causing her to loose her strength resulting in several falls.  Pt uses her rolling waller throughout the home and states she has no throw rugs or clutter in her home ( will to allow safety evaluate by this RN today. Pt states currently to get relief from her chronic back back is lying down with less pressure on her back. States he takes her pain medication however in between if needed pt administers Advil. Pt reports another issues that has arise is a recent visit to her eye doctor where she is diagnosed with double vision.  COMMUNITY RESOURCES: Pt states she is currently working on several things to improve her needs. States she is awaiting her next provider's office visit to be evaluated to receive more PCS hours with her current Biomedical engineer. Pt is also awaiting her [name] (transportation) application be completed so she can pick the application up and submit it to SCATS for services. Currently pt reports she gets transportation from her son and daughter if needed.   After RN provided pt with the fax number she contacted her provider and request her SCATs application be faxed to expedite possible services.  Based upon pt's history of depression, anxiety and stress she has indicated she has a psychiatrist and has an appointment on this Friday.  Pt has indicated when level of care was discussed for possible assisted living that she has the opportunity to go to [place] to stay with her sister if she feels she is unable to manager her care further. Pt expressed how grateful she was for the home visit today but feels she can manage herself well in the home and feels she will more comfortable when she is able to receive additional PCS hours during the week.  Objective:   Review of Systems  Constitutional: Negative.   HENT: Negative.   Eyes: Positive for blurred vision and double vision.       New diagnosis from her eye doctor as pt will follow up accordingly for interventions.  Respiratory: Negative.   Cardiovascular: Negative.   Gastrointestinal: Negative.   Genitourinary: Negative.   Musculoskeletal: Negative.   Skin: Negative.   Neurological: Negative.   Endo/Heme/Allergies: Negative.   Psychiatric/Behavioral: Positive for depression.       Pt has a Teacher, music and will consult on a scheduled appointment 2/17 this Friday.    Physical Exam  Constitutional: She is oriented to person, place, and time. She appears well-developed and well-nourished.  HENT:  Right Ear: External ear normal.  Left Ear:  External ear normal.  Nose: Nose normal.  Eyes: Right eye exhibits no discharge. Left eye exhibits no discharge.  Recent diagnosis of double vision  Neck: Normal range of motion.  Cardiovascular: Normal heart sounds.   Respiratory: Effort normal and breath sounds normal.  GI: Soft. Bowel sounds are normal.  Musculoskeletal: Normal range of motion.  Neurological: She is alert and oriented to person, place, and time.  Skin: Skin is warm and dry.   Psychiatric: She has a normal mood and affect. Her behavior is normal. Judgment and thought content normal.    Current Medications:   Current Outpatient Prescriptions  Medication Sig Dispense Refill  . aspirin EC 81 MG tablet Take 1 tablet (81 mg total) by mouth daily. (Patient taking differently: Take 81 mg by mouth at bedtime. ) 30 tablet 0  . cholecalciferol (VITAMIN D) 1000 UNITS tablet Take 1 tablet (1,000 Units total) by mouth daily. 30 tablet 12  . diclofenac sodium (VOLTAREN) 1 % GEL Apply 2 g topically 2 (two) times daily as needed (pain).     [name] esomeprazole (NEXIUM) 40 MG capsule Take 1 capsule (40 mg total) by mouth daily before breakfast. 30 capsule 1  . ferrous sulfate 325 (65 FE) MG EC tablet Take 325 mg by mouth daily with breakfast.    . gabapentin (NEURONTIN) 100 MG capsule Take 100 mg by mouth 3 (three) times daily. Patient takes 1 tablet in the am and at noon and 3 tablets at bedtime.    . insulin aspart (NOVOLOG) 100 UNIT/ML injection Inject 0-6 Units into the skin 3 (three) times daily as needed for high blood sugar (200-299 = 4 units, 300 and above = 6 units). Sliding scale    . insulin detemir (LEVEMIR) 100 UNIT/ML injection Inject 10 Units into the skin at bedtime.    . Iron-Vitamins (GERITOL COMPLETE) TABS Take 1 tablet by mouth daily.    [name] levothyroxine (SYNTHROID, LEVOTHROID) 150 MCG tablet Take 150 mcg by mouth daily before breakfast.    . LORazepam (ATIVAN) 2 MG tablet Take 1 tablet (2 mg total) by mouth every 12 (twelve) hours as needed for anxiety. 60 tablet 5  . lubiprostone (AMITIZA) 8 MCG capsule Take 8 mcg by mouth 2 (two) times daily with a meal.    . meloxicam (MOBIC) 15 MG tablet Take 15 mg by mouth daily.     Gean Birchwood ER 200 MG TB12 Take 200 mg by mouth every 12 (twelve) hours. 60 tablet 0  . ondansetron (ZOFRAN) 4 MG tablet Take 1 tablet (4 mg total) by mouth every 8 (eight) hours as needed for nausea or vomiting.    [name] oxyCODONE (ROXICODONE) 15 MG  immediate release tablet Take 1 tablet (15 mg total) by mouth every 6 (six) hours as needed for pain. 120 tablet 0  . pregabalin (LYRICA) 150 MG capsule Take 1 capsule (150 mg total) by mouth 2 (two) times daily.    . rosuvastatin (CRESTOR) 20 MG tablet Take 20 mg by mouth daily.    . sitaGLIPtin-metformin (JANUMET) 50-500 MG per tablet Take 1 tablet by mouth 2 (two) times daily with a meal.    . valACYclovir (VALTREX) 1000 MG tablet Take 1,000 mg by mouth 2 (two) times daily.     . Venlafaxine HCl 150 MG TB24 Take 150 mg by mouth daily.    . ziprasidone (GEODON) 20 MG capsule Take 20 mg by mouth every evening.    . polyethylene glycol (MIRALAX) packet Take 17 g  by mouth daily. (Patient not taking: Reported on 04/03/2015) 14 each 0  . senna-docusate (SENOKOT-S) 8.6-50 MG tablet Take 2 tablets by mouth at bedtime. (Patient not taking: Reported on 04/03/2015)     No current facility-administered medications for this visit.    Functional Status:   In your present state of health, do you have any difficulty performing the following activities: 04/03/2015 11/13/2014  Hearing? N N  Vision? Y N  Difficulty concentrating or making decisions? Y N  Walking or climbing stairs? Y N  Dressing or bathing? Y N  Doing errands, shopping? Y N  Preparing Food and eating ? N -  Using the Toilet? N -  In the past six months, have you accidently leaked urine? Y -  Do you have problems with loss of bowel control? N -  Managing your Medications? Y -  Managing your Finances? Y -  Housekeeping or managing your Housekeeping? N -    Fall/Depression Screening:    PHQ 2/9 Scores 04/03/2015 03/19/2015 10/28/2010 09/20/2010 05/22/2010 03/22/2010  PHQ - 2 Score 2 6 0 1 2 0  PHQ- 9 Score 16 20 - - - -   Filed Vitals:   04/03/15 1508  BP: 132/80  Pulse: 93  Resp: 20    Assessment:   Introduction to the [facility] program and services Diabetes and HTN  (controlled) Safety Evaluation related falls [employer] pending  with provider (primary provider's office)  Plan:  Will obtain signed consent and enroll pt into the [facility] program for services.  Will review and verify pt's medications and problem list as noted. RN will verified pt's other medical problems are managed and remain stable (Diabetes and HTN). Will verified pt has enough supplies for daly glucose checks and has a home BP device for daily monitoring of her blood pressures if needed (verified). Will completed a physical assessment with no evidence of acute issues found today. Will complete an evaluation in the home for safe passage throughout (no throw rugs or loose cords found in the home that are hazardous). Strongly encouraged pt of  keep all electric cords out the of the walk ways and avoid throw rugs. Will also instruct pt to remove clothes found on the floor in for of her dresser to avoid possible risk of falls and/or injuries.  Will verified and encouraged pt to keep hallways and area that are frequently used to be clear of clutter that can cause possible risk of falls. Will offer other alert emergency services in the area however pt declined this information. Will encouraged pt to have her cordless phone or cell phone with her 24/7 as she uses her rolling walker throughout the home if emergency services are needed. Rn will encourage pt to use her rolling walker at all times to avoid accidents and falls due to her recent diagnosis of "double vision". Due to her recent diagnosis with double vision RN offered different levels of care for possible assisted living (pt declined). Further discussion on possible request for more family to assist while awaiting more time from the [facility] services if pt is approved (reports lack of support from family at this time). Will provider printed material via [employer] on Preventing Falls and [place] for Adults. Information was reviewed with no inquires or questions from pt at this time.  Will discuss all pending matters with  provider evaluating pt on next office view for more hours for PCS services in the home and completion of the SATS application. RN will  encouraged pt to contact her provider and request the SCATs application to be faxed. RN provided the current fax number and contact personnel with the [name] service to expedite the needed services. Will inquire on any other needed community resources that may be able to assist pt further with her daily needs ( pt indicated none at this time).  Based upon pt's history of depression, anxiety and stress encountered RN provider a contact number for [employer] for immediate assistance concerning a crises situation if she is in needed of attention concerning her depression. Due to pt's ability to self manager her medications and has transportation current with her son or daughter's escort with no delays mentioned. Discussed a plan of care and goals concerning pt's follow up on her pending services with [name] and PCS hours. In additional to the information provided today on fall prevention as pt receptive to following up accordingly on these items however pt has opt not to continue home visits but receptive a health coach for follow up the pending items and services that should be in place over the next month. RN will refer for health coach for follow up call next month to inquire further on pt's progress. Will verify pt with understanding that this case manager will no longer visit however she remains enrolled with ongoing follow up calls from a health coach if there are any additional needs or services requested.   ADDENDUM: RN will continue to follow this pt due to no disease management issues to address at this time. Will follow up in few weeks concerning pt's ongoing progress.   [name], RN Care Management Coordinator [place] Office [phone]

## 2015-04-04 ENCOUNTER — Encounter: Payer: Self-pay | Admitting: *Deleted

## 2015-04-06 ENCOUNTER — Encounter: Payer: Self-pay | Admitting: *Deleted

## 2015-04-09 ENCOUNTER — Other Ambulatory Visit: Payer: Self-pay | Admitting: Pharmacist

## 2015-04-09 NOTE — Patient Outreach (Signed)
Called to follow up with patient's pain management physicians at [facility] [phone]) regarding patient's high fall risk. Will also follow up about patient's continuing to take both Lyrica and gabapentin.  Left a message on the voicemail of Varney Biles in the office. If have not heard back by 04/11/15, will follow up again at that time.  Harlow Asa, PharmD Clinical Pharmacist [place] [facility] [phone]

## 2015-04-09 NOTE — Patient Outreach (Signed)
[facility]) Care Management  [place]   04/09/2015  [name] Feb 10, 1956 [id number]  Subjective: [name] is a 60 y.o. female referred to pharmacy for medication review. Reviewed patient's allergy, medication and problem list in order to perform this evaluation. Note patient recently seen by Nurse Care Manager [name] and per [facility] encounter, medications reviewed with patient at that time.  Called and spoke with [name] who reports that she is in pain today. Discuss with [name] her pain, which the patient reports is primarily in her back, related to having 3 herniated discs. Reports that her Nucynta, oxycodone and Lyrica help to control this pain. Reports that she is followed by [facility] in [facility] for the management of this pain. Reports that she also receives steroid injections near the spine once a month to help to control the pain. Reports that she has been told that a spinal fusion surgery is an option, but that this type of procedure could actually leave her in more pain than she is now and was not advised.  Discuss with patient her falls and the potential contribution of these pain medications, as well as her lorazepam and gabapentin to those falls. Discuss with patient the importance of using caution with each of these medications that can make her more sedated and her gait more impaired. Patient reports that she always walks with her walker, but that she sometimes falls despite the use of this tool. Reports that she attributes her falling to sudden sharp pains that throw her off balance, rather than side effects from her medications. However, discuss with patient how these still affect her balance and the importance of taking her time moving around and sitting, particularly if she is feeling sedated or dizzy. Patient verbalizes understanding.  Patient reports that her Pain Management physicians are aware of her recent  falls. Discuss with patient her use of both Lyrica and gabapentin. Patient reports that she had recently been told "by a nurse" that these medications were a duplication and advised against taking them together. Reports that she spoke with her physicians at [facility] about this and that she was advised to try stopping one or the other. Patient reports that she tried stopping the gabapentin first, but found that her nerve pain in her feet became uncontrolled. Reports that she then tried stopping the Lyrica instead, but reports that she had increased pain in her back. Reports that she let her pain management physicians know that she resumed taking both.   Patient reports that she does not have trouble with constipation related to her opioids. Reports that this is controlled by her Amitiza. Reports that she has Zofran for nausea, but that she rarely uses it.  Discussed with patient the potential interaction between her supplements that contain iron and her levothyroxine. Patient reports that she currently takes her levothyroxine first thing in the morning before food or other medications. Reports that she then takes her medications, including her multivitamin and iron, with her breakfast. Reports that this is sometimes as close as 20 minutes after her levothyroxine. Encouraged patient to wait at least 30 minutes after her levothyroxine before eating and taking her other medications. Encouraged patient to move the administration of her iron and multivitamin to later in the day, lunch or dinner time, to reduce it's potential affect on her levothyroxine level. Patient verbalized understanding and stated that she would do this.  Patent states that she has no further questions  for me at this time. Provided patient with my phone number in case she has questions in the future.  Objective:   Current Medications: Current Outpatient Prescriptions  Medication Sig Dispense Refill  . aspirin EC 81 MG tablet Take  1 tablet (81 mg total) by mouth daily. (Patient taking differently: Take 81 mg by mouth at bedtime. ) 30 tablet 0  . cholecalciferol (VITAMIN D) 1000 UNITS tablet Take 1 tablet (1,000 Units total) by mouth daily. 30 tablet 12  . diclofenac sodium (VOLTAREN) 1 % GEL Apply 2 g topically 2 (two) times daily as needed (pain).     [name] esomeprazole (NEXIUM) 40 MG capsule Take 1 capsule (40 mg total) by mouth daily before breakfast. 30 capsule 1  . ferrous sulfate 325 (65 FE) MG EC tablet Take 325 mg by mouth daily with breakfast.    . gabapentin (NEURONTIN) 100 MG capsule Take 100 mg by mouth 3 (three) times daily. Patient takes 1 tablet in the am and at noon and 3 tablets at bedtime.    . insulin aspart (NOVOLOG) 100 UNIT/ML injection Inject 0-6 Units into the skin 3 (three) times daily as needed for high blood sugar (200-299 = 4 units, 300 and above = 6 units). Sliding scale    . insulin detemir (LEVEMIR) 100 UNIT/ML injection Inject 10 Units into the skin at bedtime.    . Iron-Vitamins (GERITOL COMPLETE) TABS Take 1 tablet by mouth daily.    [name] levothyroxine (SYNTHROID, LEVOTHROID) 150 MCG tablet Take 150 mcg by mouth daily before breakfast.    . LORazepam (ATIVAN) 2 MG tablet Take 1 tablet (2 mg total) by mouth every 12 (twelve) hours as needed for anxiety. 60 tablet 5  . lubiprostone (AMITIZA) 8 MCG capsule Take 8 mcg by mouth 2 (two) times daily with a meal.    . meloxicam (MOBIC) 15 MG tablet Take 15 mg by mouth daily.     Gean Birchwood ER 200 MG TB12 Take 200 mg by mouth every 12 (twelve) hours. 60 tablet 0  . ondansetron (ZOFRAN) 4 MG tablet Take 1 tablet (4 mg total) by mouth every 8 (eight) hours as needed for nausea or vomiting.    [name] oxyCODONE (ROXICODONE) 15 MG immediate release tablet Take 1 tablet (15 mg total) by mouth every 6 (six) hours as needed for pain. 120 tablet 0  . polyethylene glycol (MIRALAX) packet Take 17 g by mouth daily. (Patient not taking: Reported on 04/03/2015) 14 each 0  .  pregabalin (LYRICA) 150 MG capsule Take 1 capsule (150 mg total) by mouth 2 (two) times daily.    . rosuvastatin (CRESTOR) 20 MG tablet Take 20 mg by mouth daily.    [name] senna-docusate (SENOKOT-S) 8.6-50 MG tablet Take 2 tablets by mouth at bedtime. (Patient not taking: Reported on 04/03/2015)    . sitaGLIPtin-metformin (JANUMET) 50-500 MG per tablet Take 1 tablet by mouth 2 (two) times daily with a meal.    . valACYclovir (VALTREX) 1000 MG tablet Take 1,000 mg by mouth 2 (two) times daily.     . Venlafaxine HCl 150 MG TB24 Take 150 mg by mouth daily.    . ziprasidone (GEODON) 20 MG capsule Take 20 mg by mouth every evening.     No current facility-administered medications for this visit.   Assessment:   Drugs sorted by system:  Neurologic/Psychologic: lorazepam, venlafaxine, Geodon  Cardiovascular: aspirin, Crestor  Gastrointestinal: Nexium, Amitiza, ondansetron  Endocrine: Novolog, Levemir, levothyroxine, Janumet  Topical: Voltaren gel  Pain: gabapentin, Lyrica, meloxicam, Nucynta ER, oxycodone  Vitamins/Minerals: Vitamin D, ferrous sulfate, multivitamin  Infectious Diseases: Valtrex    Duplications in therapy: gabapentin + Lyrica  Gaps in therapy: none noted  Drug interactions: [place] + Zofran: Moderate Risk QTc-Prolonging Agents, such as ondansetron, may enhance the QTc-prolonging effect of Highest Risk QTc-Prolonging Agents, such as Geodon. Note patient uses Zofran only on an as needed basis. Reports that this use is rare. . Gabapentin + Geodon + lorazepam + Lyrica + Nucynta ER + oxycodone: CNS depressants may enhance the CNS depressant effect of CNS Depressants. Discussed at length with patient. Note patient followed by [facility] in [place]. Per note in EPIC on 04/03/15 from [place], patient also followed by a psychiatrist. . Venlafaxine + Geodon: QTc-Prolonging Agents, such as venlafaxine, may enhance the QTc-prolonging effect of Highest Risk  QTc-Prolonging Agents, such as Geodon. Per EPIC record, EKG performed 11/12/14, noted "since last tracing no significant change". . Ferrous sulfate/Geritol + levothyroxine: Iron Salts may decrease the serum concentration of levothyroxine. Separate the oral administration of iron salts and levothyroxine by at least 4 hours. Patient counseled to separate this administration. Other issues: . Note per EPIC record on 03/26/15, patient with an EGFR of 64 mL/min  Plan:  1) Will follow up with patient's pain management physicians at [facility] [phone]) regarding patient's high fall risk. Will also follow up about patient's continuing to take both Lyrica and gabapentin.  2) Following discussion with [facility], will plan to close pharmacy episode.  Harlow Asa, PharmD Clinical Pharmacist [place] [facility] [phone]

## 2015-04-11 ENCOUNTER — Other Ambulatory Visit: Payer: Self-pay | Admitting: Pharmacist

## 2015-04-11 NOTE — Patient Outreach (Signed)
Called again to follow up with patient's pain management physicians at [facility] [phone]) regarding patient's high fall risk. Will also follow up about patient's continuing to take both Lyrica and gabapentin.  Left another message on the voicemail of Varney Biles in the office. If have not heard back by 04/13/15, will follow up again at that time.  Harlow Asa, PharmD Clinical Pharmacist [place] [facility] [phone]

## 2015-04-11 NOTE — Patient Outreach (Signed)
Received a call back from [facility] [phone]) in Dr. Baruch Merl, patient's pain management physician's, office. Let Varney Biles know that the patient reports continuing to take both Lyrica and gabapentin. Also discussed with Varney Biles patient's high fall risk and the medications that may contribute to her falling. [name] verbalizes understanding and states that she will put in a note to Dr. Andree Elk to make him aware of these issues, in case he is not already.  Will close the pharmacy episode at this time.  Harlow Asa, PharmD Clinical Pharmacist [place] [facility] [phone]'

## 2015-04-16 DIAGNOSIS — Z79899 Other long term (current) drug therapy: Secondary | ICD-10-CM | POA: Diagnosis not present

## 2015-04-16 DIAGNOSIS — M5137 Other intervertebral disc degeneration, lumbosacral region: Secondary | ICD-10-CM | POA: Diagnosis not present

## 2015-04-16 DIAGNOSIS — G894 Chronic pain syndrome: Secondary | ICD-10-CM | POA: Diagnosis not present

## 2015-04-20 ENCOUNTER — Other Ambulatory Visit: Payer: Self-pay | Admitting: *Deleted

## 2015-04-20 NOTE — Patient Outreach (Signed)
[facility]) Care Management  04/20/2015  JACINTA PENALVER [birth year] [id number]    R spoke with pt today and reintroduced the [facility] program and services. Pt remembers recent home visits and acknowledges RN case Freight forwarder. RN inquired on pending services as pt reports the SCATs was accepted however currently pending an interview on 3/15 prior to the start of services and her PCS application remains with her provider with a pending appointments on 3/10 for pt to be evaluated prior to submitting her application for additional times for the aide that is already involved with her care. Pt continues to work with her provider in managing her pain. Pt taking Gabapentin 100 mg with 3 tabs in the AM, PM and at bedtime which was started on this pass Monday. Pt denies any falls or incidents since RN's last home visit. RN offered to follow up one addition call next month to check if all pending services has been initiated and in place with active services. Pt receptive and an appointment was scheduled. No other inquires or resources needed at this time.  Plan of care reviewed with extended time on the goals due to pending services. Also discussed possible discharge next month if services are successful in place and active (pt with understanding).  No further request or inquires concerning THN involvement.   [name], RN Care Management Coordinator [place] Office [phone]

## 2015-04-30 ENCOUNTER — Ambulatory Visit
Admission: RE | Admit: 2015-04-30 | Discharge: 2015-04-30 | Disposition: A | Discharge status: Home / Self Care | Payer: Medicare Other | Source: Ambulatory Visit | Attending: Oncology | Admitting: Oncology

## 2015-04-30 ENCOUNTER — Other Ambulatory Visit: Payer: Self-pay | Admitting: Oncology

## 2015-04-30 DIAGNOSIS — E1142 Type 2 diabetes mellitus with diabetic polyneuropathy: Secondary | ICD-10-CM

## 2015-04-30 DIAGNOSIS — R928 Other abnormal and inconclusive findings on diagnostic imaging of breast: Secondary | ICD-10-CM | POA: Diagnosis not present

## 2015-04-30 DIAGNOSIS — C50411 Malignant neoplasm of upper-outer quadrant of right female breast: Secondary | ICD-10-CM

## 2015-04-30 DIAGNOSIS — Z794 Long term (current) use of insulin: Secondary | ICD-10-CM

## 2015-04-30 DIAGNOSIS — N6489 Other specified disorders of breast: Secondary | ICD-10-CM | POA: Diagnosis not present

## 2015-04-30 DIAGNOSIS — E119 Type 2 diabetes mellitus without complications: Secondary | ICD-10-CM

## 2015-05-11 DIAGNOSIS — I10 Essential (primary) hypertension: Secondary | ICD-10-CM | POA: Diagnosis not present

## 2015-05-11 DIAGNOSIS — Z87891 Personal history of nicotine dependence: Secondary | ICD-10-CM | POA: Diagnosis not present

## 2015-05-11 DIAGNOSIS — Z885 Allergy status to narcotic agent status: Secondary | ICD-10-CM | POA: Diagnosis not present

## 2015-05-11 DIAGNOSIS — H02403 Unspecified ptosis of bilateral eyelids: Secondary | ICD-10-CM | POA: Diagnosis not present

## 2015-05-11 DIAGNOSIS — Z79899 Other long term (current) drug therapy: Secondary | ICD-10-CM | POA: Diagnosis not present

## 2015-05-11 DIAGNOSIS — H2513 Age-related nuclear cataract, bilateral: Secondary | ICD-10-CM | POA: Diagnosis not present

## 2015-05-11 DIAGNOSIS — Z794 Long term (current) use of insulin: Secondary | ICD-10-CM | POA: Diagnosis not present

## 2015-05-11 DIAGNOSIS — E11319 Type 2 diabetes mellitus with unspecified diabetic retinopathy without macular edema: Secondary | ICD-10-CM | POA: Diagnosis not present

## 2015-05-11 DIAGNOSIS — H47393 Other disorders of optic disc, bilateral: Secondary | ICD-10-CM | POA: Diagnosis not present

## 2015-05-11 DIAGNOSIS — H0520 Unspecified exophthalmos: Secondary | ICD-10-CM | POA: Diagnosis not present

## 2015-05-11 IMAGING — CR DG CHEST 2V
1 series · 2 of 2 positions shown · non-contrast
Comparison: 08/10/2013

CLINICAL DATA: Cough, congestion and sneezing.

EXAM:
CHEST  2 VIEW

[Series 1: w chest pa · 0.14mm/px · 2 of 2 slices shown]
[im 1/2]
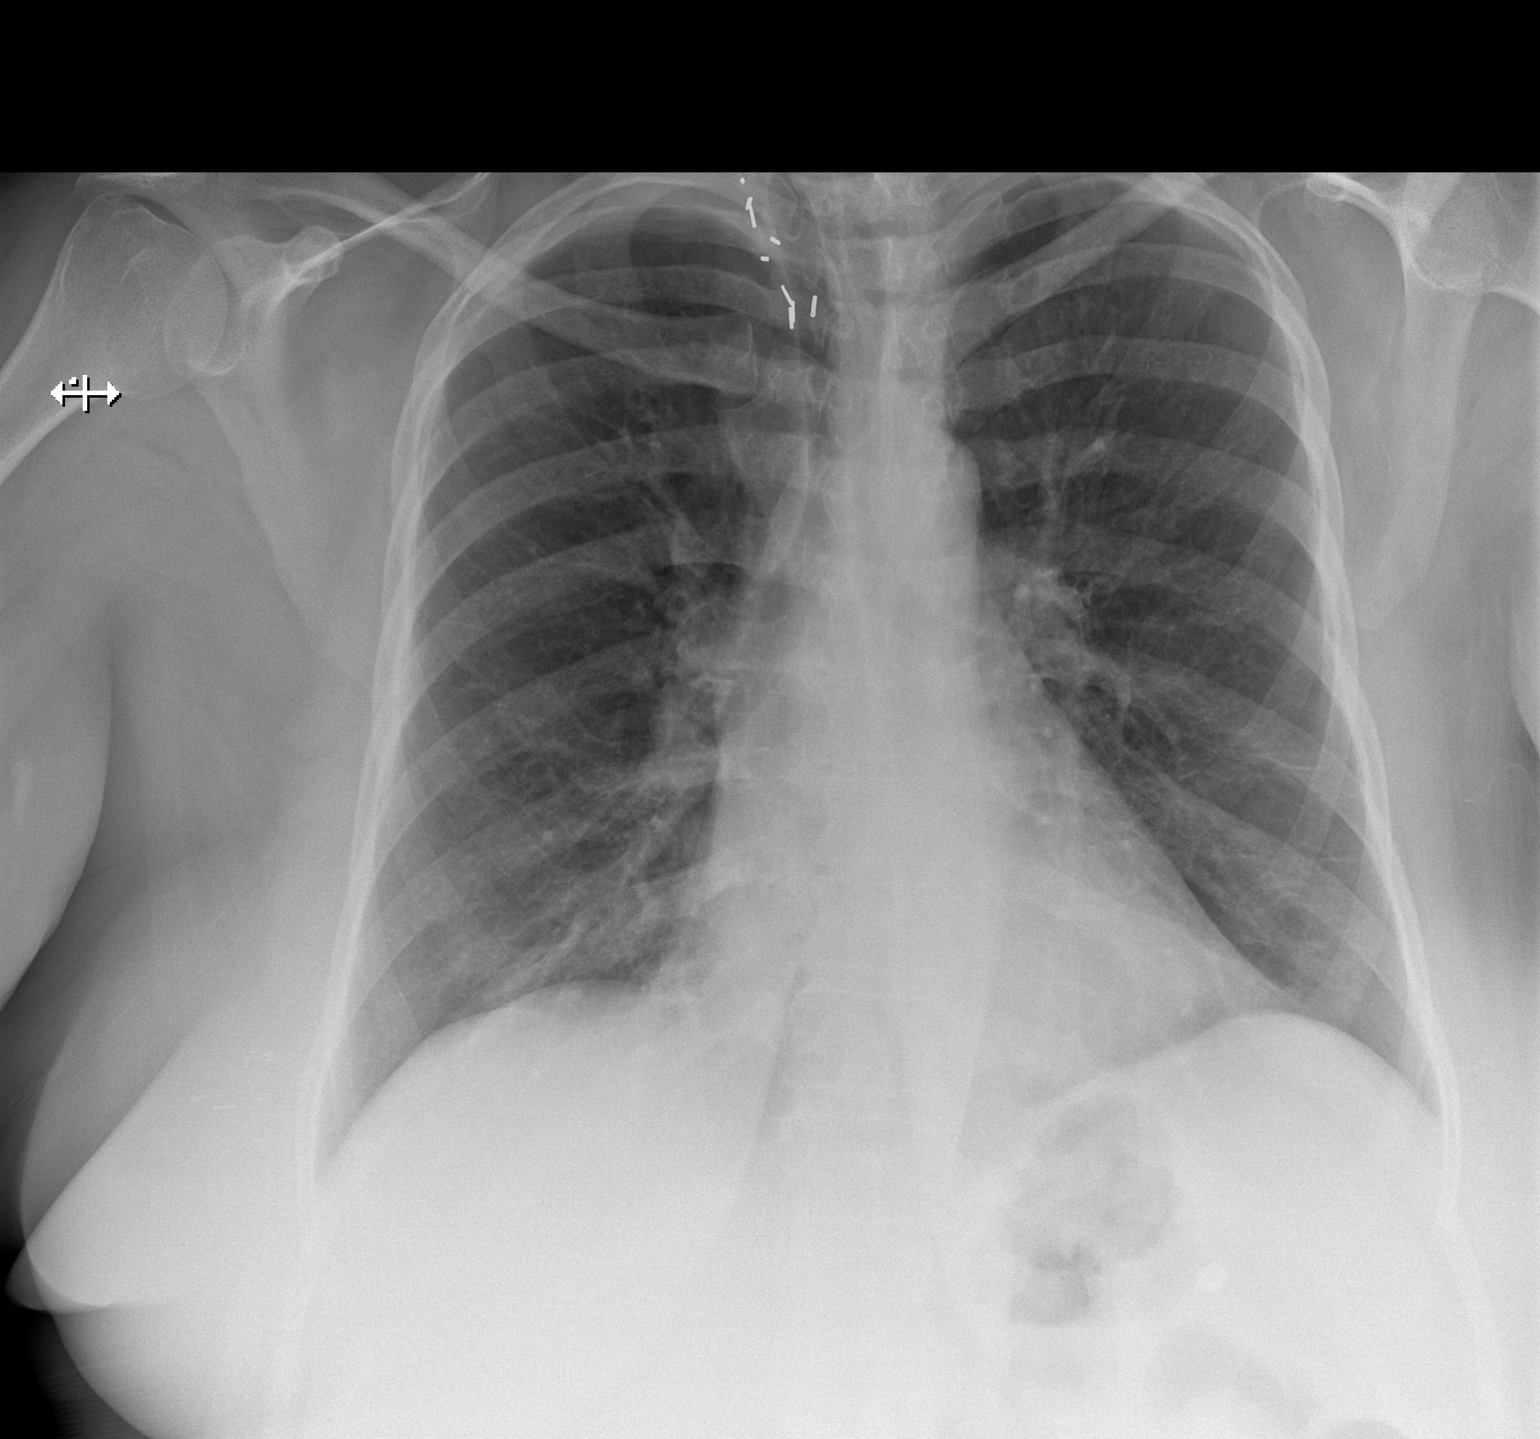
[im 2/2]
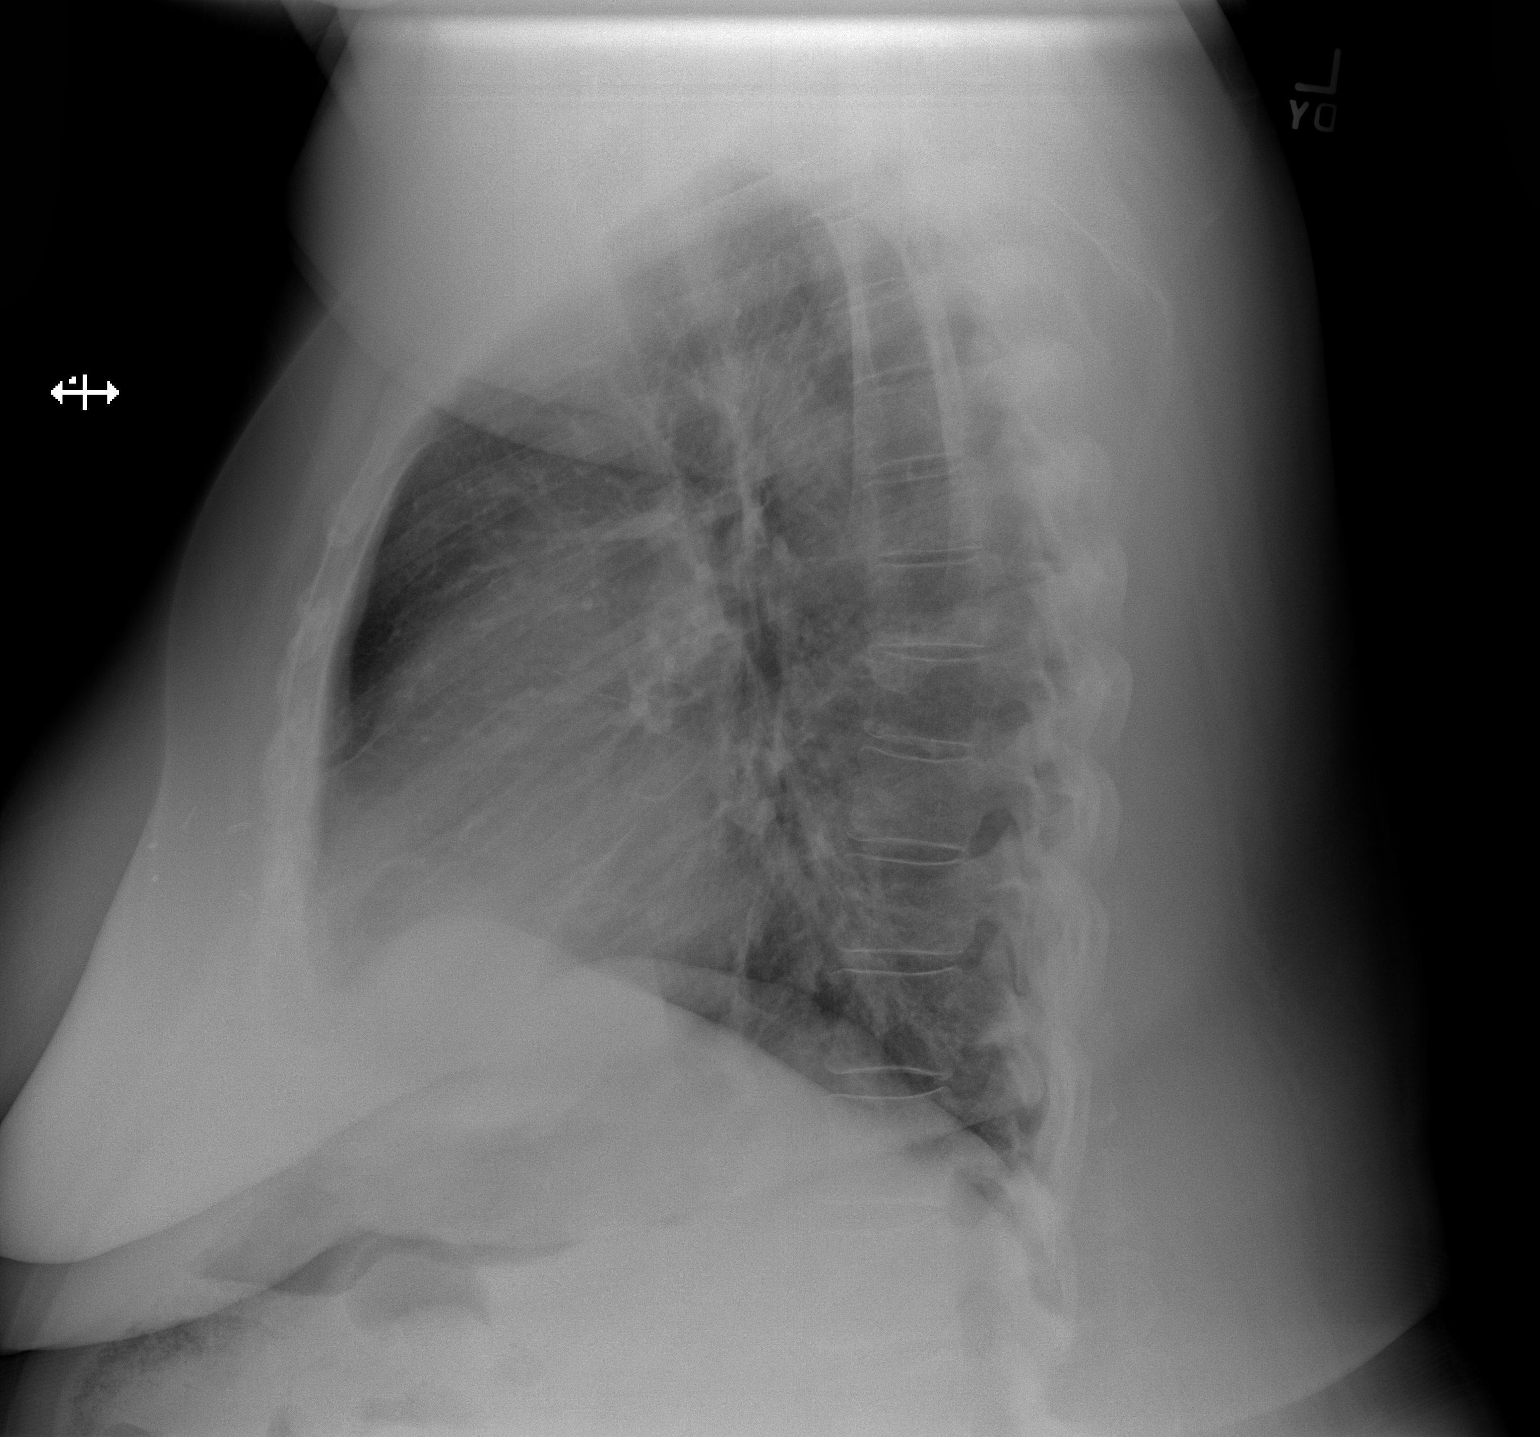

[2 of 2 positions shown; findings below may reference images not displayed]

FINDINGS: Again noted are surgical clips near the thoracic inlet. Heart size
is normal. Patient is mildly rotated towards the right side. Lungs
are clear without airspace disease or edema. No pleural effusions.
No acute bone abnormality.
IMPRESSION: No active cardiopulmonary disease.

## 2015-05-14 DIAGNOSIS — Z79899 Other long term (current) drug therapy: Secondary | ICD-10-CM | POA: Diagnosis not present

## 2015-05-14 DIAGNOSIS — M5137 Other intervertebral disc degeneration, lumbosacral region: Secondary | ICD-10-CM | POA: Diagnosis not present

## 2015-05-14 DIAGNOSIS — G894 Chronic pain syndrome: Secondary | ICD-10-CM | POA: Diagnosis not present

## 2015-05-14 DIAGNOSIS — Z79891 Long term (current) use of opiate analgesic: Secondary | ICD-10-CM | POA: Diagnosis not present

## 2015-05-21 ENCOUNTER — Other Ambulatory Visit: Payer: Self-pay | Admitting: *Deleted

## 2015-05-21 NOTE — Patient Outreach (Signed)
[facility]) Care Management  05/21/2015  LARENDA REEDY [birth year] [id number]   Telephone Assessment  RN attempted to follow up with pt today however pt not available. RN able to  Leave a HIPAA approved voice message requesting a call back.   [name], RN Care Management Coordinator [place] Office [phone]

## 2015-05-23 ENCOUNTER — Other Ambulatory Visit: Payer: Self-pay | Admitting: *Deleted

## 2015-05-23 NOTE — Patient Outreach (Signed)
[facility] [facility]) Care Management  05/23/2015  GABI MCFATE 1956-01-25 [id number]  RN attempted outreach call however remains unsuccessful however RN able to leave a HIPAA approved voice message requesting a call back. Will follow up accordingly with any needed services. Will continue outreach call if no contact will send outreach letter pre protocol.   [name], RN Care Management Coordinator [place] Office [phone]

## 2015-05-30 ENCOUNTER — Encounter: Payer: Self-pay | Admitting: *Deleted

## 2015-05-30 ENCOUNTER — Other Ambulatory Visit: Payer: Self-pay | Admitting: *Deleted

## 2015-05-30 NOTE — Patient Outreach (Signed)
[place] [facility]) Care Management  05/30/2015  [name] 1955-10-14 [id number]   RN spoke with pt today and reintroduce RN for [facility] services. Pt remembers and receptive to the call today. Updates received and RN inquired on the plan of care and pending goals. Pt states she has an office visit with Dr. Laurance Flatten on tomorrow who will evaluate pt for additional PCS hours. RN will also at that time request a prescribed for [facility] for a 3-1 commode. Pt also reports she is arranged with [name] services however issues with arriving late to her scheduled appointment. RN has encouraged pt to speak with the [facility] supervisor for possible remedy. Other options provided to pt however may incur added expenses for transportation.  RN offered any additional [facility] services for nursing and/or social worker for needed resources. None needed at this time as pt indicates she has no other needs. In summary pt will follow up tomorrow with all her needs and will follow through accordingly with the providers recommendations. No other needs via [facility] as this case will be closed and provider will be notified.  [name], RN Care Management Coordinator [place] Office [phone]

## 2015-06-07 DIAGNOSIS — E1122 Type 2 diabetes mellitus with diabetic chronic kidney disease: Secondary | ICD-10-CM | POA: Diagnosis not present

## 2015-06-07 DIAGNOSIS — N08 Glomerular disorders in diseases classified elsewhere: Secondary | ICD-10-CM | POA: Diagnosis not present

## 2015-06-07 DIAGNOSIS — K120 Recurrent oral aphthae: Secondary | ICD-10-CM | POA: Diagnosis not present

## 2015-06-07 DIAGNOSIS — E782 Mixed hyperlipidemia: Secondary | ICD-10-CM | POA: Diagnosis not present

## 2015-06-07 DIAGNOSIS — N182 Chronic kidney disease, stage 2 (mild): Secondary | ICD-10-CM | POA: Diagnosis not present

## 2015-06-07 DIAGNOSIS — I10 Essential (primary) hypertension: Secondary | ICD-10-CM | POA: Diagnosis not present

## 2015-06-11 DIAGNOSIS — M545 Low back pain: Secondary | ICD-10-CM | POA: Diagnosis not present

## 2015-06-11 DIAGNOSIS — G894 Chronic pain syndrome: Secondary | ICD-10-CM | POA: Diagnosis not present

## 2015-06-11 DIAGNOSIS — Z79899 Other long term (current) drug therapy: Secondary | ICD-10-CM | POA: Diagnosis not present

## 2015-06-11 DIAGNOSIS — Z79891 Long term (current) use of opiate analgesic: Secondary | ICD-10-CM | POA: Diagnosis not present

## 2015-06-11 DIAGNOSIS — M5137 Other intervertebral disc degeneration, lumbosacral region: Secondary | ICD-10-CM | POA: Diagnosis not present

## 2015-06-23 ENCOUNTER — Emergency Department (HOSPITAL_COMMUNITY): Payer: Medicare Other

## 2015-06-23 ENCOUNTER — Inpatient Hospital Stay (HOSPITAL_COMMUNITY)
Admission: EM | Admit: 2015-06-23 | Discharge: 2015-06-27 | DRG: 871 | Disposition: A | Discharge status: Home Health | Payer: Medicare Other | Attending: Family Medicine | Admitting: Family Medicine

## 2015-06-23 ENCOUNTER — Encounter (HOSPITAL_COMMUNITY): Payer: Self-pay | Admitting: Neurology

## 2015-06-23 DIAGNOSIS — E039 Hypothyroidism, unspecified: Secondary | ICD-10-CM | POA: Diagnosis present

## 2015-06-23 DIAGNOSIS — E662 Morbid (severe) obesity with alveolar hypoventilation: Secondary | ICD-10-CM | POA: Diagnosis present

## 2015-06-23 DIAGNOSIS — C50411 Malignant neoplasm of upper-outer quadrant of right female breast: Secondary | ICD-10-CM

## 2015-06-23 DIAGNOSIS — I10 Essential (primary) hypertension: Secondary | ICD-10-CM | POA: Diagnosis present

## 2015-06-23 DIAGNOSIS — G9340 Encephalopathy, unspecified: Secondary | ICD-10-CM | POA: Diagnosis not present

## 2015-06-23 DIAGNOSIS — E785 Hyperlipidemia, unspecified: Secondary | ICD-10-CM | POA: Diagnosis not present

## 2015-06-23 DIAGNOSIS — Z7984 Long term (current) use of oral hypoglycemic drugs: Secondary | ICD-10-CM | POA: Diagnosis not present

## 2015-06-23 DIAGNOSIS — R918 Other nonspecific abnormal finding of lung field: Secondary | ICD-10-CM | POA: Diagnosis not present

## 2015-06-23 DIAGNOSIS — Z794 Long term (current) use of insulin: Secondary | ICD-10-CM

## 2015-06-23 DIAGNOSIS — G219 Secondary parkinsonism, unspecified: Secondary | ICD-10-CM | POA: Diagnosis not present

## 2015-06-23 DIAGNOSIS — Z791 Long term (current) use of non-steroidal anti-inflammatories (NSAID): Secondary | ICD-10-CM | POA: Diagnosis not present

## 2015-06-23 DIAGNOSIS — N183 Chronic kidney disease, stage 3 unspecified: Secondary | ICD-10-CM | POA: Diagnosis present

## 2015-06-23 DIAGNOSIS — F419 Anxiety disorder, unspecified: Secondary | ICD-10-CM | POA: Diagnosis present

## 2015-06-23 DIAGNOSIS — A419 Sepsis, unspecified organism: Principal | ICD-10-CM | POA: Diagnosis present

## 2015-06-23 DIAGNOSIS — R0600 Dyspnea, unspecified: Secondary | ICD-10-CM | POA: Diagnosis not present

## 2015-06-23 DIAGNOSIS — Z923 Personal history of irradiation: Secondary | ICD-10-CM | POA: Diagnosis not present

## 2015-06-23 DIAGNOSIS — I129 Hypertensive chronic kidney disease with stage 1 through stage 4 chronic kidney disease, or unspecified chronic kidney disease: Secondary | ICD-10-CM | POA: Diagnosis present

## 2015-06-23 DIAGNOSIS — J189 Pneumonia, unspecified organism: Secondary | ICD-10-CM | POA: Diagnosis present

## 2015-06-23 DIAGNOSIS — G8929 Other chronic pain: Secondary | ICD-10-CM | POA: Diagnosis present

## 2015-06-23 DIAGNOSIS — Z7982 Long term (current) use of aspirin: Secondary | ICD-10-CM | POA: Diagnosis not present

## 2015-06-23 DIAGNOSIS — B009 Herpesviral infection, unspecified: Secondary | ICD-10-CM | POA: Diagnosis present

## 2015-06-23 DIAGNOSIS — R7989 Other specified abnormal findings of blood chemistry: Secondary | ICD-10-CM | POA: Diagnosis not present

## 2015-06-23 DIAGNOSIS — E1122 Type 2 diabetes mellitus with diabetic chronic kidney disease: Secondary | ICD-10-CM | POA: Diagnosis present

## 2015-06-23 DIAGNOSIS — N179 Acute kidney failure, unspecified: Secondary | ICD-10-CM | POA: Diagnosis present

## 2015-06-23 DIAGNOSIS — N1832 Chronic kidney disease, stage 3b: Secondary | ICD-10-CM | POA: Diagnosis present

## 2015-06-23 DIAGNOSIS — N181 Chronic kidney disease, stage 1: Secondary | ICD-10-CM

## 2015-06-23 DIAGNOSIS — E871 Hypo-osmolality and hyponatremia: Secondary | ICD-10-CM | POA: Diagnosis not present

## 2015-06-23 DIAGNOSIS — T50905A Adverse effect of unspecified drugs, medicaments and biological substances, initial encounter: Secondary | ICD-10-CM | POA: Diagnosis not present

## 2015-06-23 DIAGNOSIS — Z87891 Personal history of nicotine dependence: Secondary | ICD-10-CM | POA: Diagnosis not present

## 2015-06-23 DIAGNOSIS — D649 Anemia, unspecified: Secondary | ICD-10-CM | POA: Diagnosis present

## 2015-06-23 DIAGNOSIS — M545 Low back pain: Secondary | ICD-10-CM | POA: Diagnosis not present

## 2015-06-23 DIAGNOSIS — F32A Depression, unspecified: Secondary | ICD-10-CM | POA: Diagnosis present

## 2015-06-23 DIAGNOSIS — I130 Hypertensive heart and chronic kidney disease with heart failure and stage 1 through stage 4 chronic kidney disease, or unspecified chronic kidney disease: Secondary | ICD-10-CM | POA: Diagnosis present

## 2015-06-23 DIAGNOSIS — G4733 Obstructive sleep apnea (adult) (pediatric): Secondary | ICD-10-CM | POA: Diagnosis present

## 2015-06-23 DIAGNOSIS — E1142 Type 2 diabetes mellitus with diabetic polyneuropathy: Secondary | ICD-10-CM | POA: Diagnosis present

## 2015-06-23 DIAGNOSIS — Z96653 Presence of artificial knee joint, bilateral: Secondary | ICD-10-CM | POA: Diagnosis present

## 2015-06-23 DIAGNOSIS — Z6841 Body Mass Index (BMI) 40.0 and over, adult: Secondary | ICD-10-CM | POA: Diagnosis not present

## 2015-06-23 DIAGNOSIS — F319 Bipolar disorder, unspecified: Secondary | ICD-10-CM | POA: Diagnosis present

## 2015-06-23 DIAGNOSIS — A084 Viral intestinal infection, unspecified: Secondary | ICD-10-CM | POA: Diagnosis present

## 2015-06-23 DIAGNOSIS — N200 Calculus of kidney: Secondary | ICD-10-CM | POA: Diagnosis present

## 2015-06-23 DIAGNOSIS — E119 Type 2 diabetes mellitus without complications: Secondary | ICD-10-CM

## 2015-06-23 DIAGNOSIS — Z79899 Other long term (current) drug therapy: Secondary | ICD-10-CM | POA: Diagnosis not present

## 2015-06-23 DIAGNOSIS — K219 Gastro-esophageal reflux disease without esophagitis: Secondary | ICD-10-CM | POA: Diagnosis not present

## 2015-06-23 DIAGNOSIS — E872 Acidosis: Secondary | ICD-10-CM | POA: Diagnosis not present

## 2015-06-23 DIAGNOSIS — R778 Other specified abnormalities of plasma proteins: Secondary | ICD-10-CM | POA: Diagnosis not present

## 2015-06-23 DIAGNOSIS — Z9989 Dependence on other enabling machines and devices: Secondary | ICD-10-CM

## 2015-06-23 DIAGNOSIS — T7840XA Allergy, unspecified, initial encounter: Secondary | ICD-10-CM | POA: Diagnosis not present

## 2015-06-23 DIAGNOSIS — F209 Schizophrenia, unspecified: Secondary | ICD-10-CM | POA: Diagnosis present

## 2015-06-23 DIAGNOSIS — A09 Infectious gastroenteritis and colitis, unspecified: Secondary | ICD-10-CM | POA: Diagnosis not present

## 2015-06-23 DIAGNOSIS — R109 Unspecified abdominal pain: Secondary | ICD-10-CM | POA: Diagnosis not present

## 2015-06-23 DIAGNOSIS — E669 Obesity, unspecified: Secondary | ICD-10-CM

## 2015-06-23 DIAGNOSIS — Z853 Personal history of malignant neoplasm of breast: Secondary | ICD-10-CM

## 2015-06-23 DIAGNOSIS — I5032 Chronic diastolic (congestive) heart failure: Secondary | ICD-10-CM | POA: Diagnosis not present

## 2015-06-23 DIAGNOSIS — J4530 Mild persistent asthma, uncomplicated: Secondary | ICD-10-CM | POA: Diagnosis present

## 2015-06-23 DIAGNOSIS — W19XXXA Unspecified fall, initial encounter: Secondary | ICD-10-CM

## 2015-06-23 DIAGNOSIS — F329 Major depressive disorder, single episode, unspecified: Secondary | ICD-10-CM | POA: Diagnosis present

## 2015-06-23 DIAGNOSIS — E02 Subclinical iodine-deficiency hypothyroidism: Secondary | ICD-10-CM | POA: Diagnosis not present

## 2015-06-23 DIAGNOSIS — R197 Diarrhea, unspecified: Secondary | ICD-10-CM | POA: Diagnosis present

## 2015-06-23 LAB — COMPREHENSIVE METABOLIC PANEL
ALK PHOS: 79 U/L (ref 38–126)
ALT: 50 U/L (ref 14–54)
ALT: 45 U/L (ref 14–54)
ANION GAP: 16 — AB (ref 5–15)
AST: 39 U/L (ref 15–41)
AST: 31 U/L (ref 15–41)
Albumin: 2.5 g/dL — ABNORMAL LOW (ref 3.5–5.0)
Albumin: 2.3 g/dL — ABNORMAL LOW (ref 3.5–5.0)
Alkaline Phosphatase: 74 U/L (ref 38–126)
Anion gap: 14 (ref 5–15)
BUN: 27 mg/dL — ABNORMAL HIGH (ref 6–20)
BUN: 19 mg/dL (ref 6–20)
CALCIUM: 9.1 mg/dL (ref 8.9–10.3)
CO2: 20 mmol/L — AB (ref 22–32)
CO2: 21 mmol/L — ABNORMAL LOW (ref 22–32)
Calcium: 8.7 mg/dL — ABNORMAL LOW (ref 8.9–10.3)
Chloride: 92 mmol/L — ABNORMAL LOW (ref 101–111)
Chloride: 96 mmol/L — ABNORMAL LOW (ref 101–111)
Creatinine, Ser: 1.55 mg/dL — ABNORMAL HIGH (ref 0.44–1.00)
Creatinine, Ser: 1.28 mg/dL — ABNORMAL HIGH (ref 0.44–1.00)
GFR, EST AFRICAN AMERICAN: 41 mL/min — AB (ref >60)
GFR, EST AFRICAN AMERICAN: 52 mL/min — AB (ref >60)
GFR, EST NON AFRICAN AMERICAN: 36 mL/min — AB (ref >60)
GFR, EST NON AFRICAN AMERICAN: 45 mL/min — AB (ref >60)
Glucose, Bld: 132 mg/dL — ABNORMAL HIGH (ref 65–99)
Glucose, Bld: 147 mg/dL — ABNORMAL HIGH (ref 65–99)
POTASSIUM: 3.9 mmol/L (ref 3.5–5.1)
Potassium: 4.3 mmol/L (ref 3.5–5.1)
SODIUM: 128 mmol/L — AB (ref 135–145)
Sodium: 131 mmol/L — ABNORMAL LOW (ref 135–145)
TOTAL PROTEIN: 5.4 g/dL — AB (ref 6.5–8.1)
Total Bilirubin: 0.6 mg/dL (ref 0.3–1.2)
Total Bilirubin: 0.5 mg/dL (ref 0.3–1.2)
Total Protein: 6.1 g/dL — ABNORMAL LOW (ref 6.5–8.1)

## 2015-06-23 LAB — URINALYSIS, ROUTINE W REFLEX MICROSCOPIC
Bilirubin Urine: NEGATIVE
Glucose, UA: NEGATIVE mg/dL
Ketones, ur: NEGATIVE mg/dL
LEUKOCYTES UA: NEGATIVE
NITRITE: NEGATIVE
PROTEIN: NEGATIVE mg/dL
SPECIFIC GRAVITY, URINE: 1.01 (ref 1.005–1.030)
pH: 7 (ref 5.0–8.0)

## 2015-06-23 LAB — STREP PNEUMONIAE URINARY ANTIGEN: STREP PNEUMO URINARY ANTIGEN: NEGATIVE

## 2015-06-23 LAB — CBC
HCT: 31.2 % — ABNORMAL LOW (ref 36.0–46.0)
HCT: 30.1 % — ABNORMAL LOW (ref 36.0–46.0)
HEMATOCRIT: 27.2 % — AB (ref 36.0–46.0)
HEMOGLOBIN: 10.5 g/dL — AB (ref 12.0–15.0)
HEMOGLOBIN: 10.1 g/dL — AB (ref 12.0–15.0)
Hemoglobin: 9.2 g/dL — ABNORMAL LOW (ref 12.0–15.0)
MCH: 28.2 pg (ref 26.0–34.0)
MCH: 27.6 pg (ref 26.0–34.0)
MCH: 28.5 pg (ref 26.0–34.0)
MCHC: 33.7 g/dL (ref 30.0–36.0)
MCHC: 33.8 g/dL (ref 30.0–36.0)
MCHC: 33.6 g/dL (ref 30.0–36.0)
MCV: 83.6 fL (ref 78.0–100.0)
MCV: 81.7 fL (ref 78.0–100.0)
MCV: 84.8 fL (ref 78.0–100.0)
PLATELETS: 234 10*3/uL (ref 150–400)
PLATELETS: 254 10*3/uL (ref 150–400)
Platelets: 259 10*3/uL (ref 150–400)
RBC: 3.73 MIL/uL — AB (ref 3.87–5.11)
RBC: 3.33 MIL/uL — ABNORMAL LOW (ref 3.87–5.11)
RBC: 3.55 MIL/uL — ABNORMAL LOW (ref 3.87–5.11)
RDW: 18.2 % — ABNORMAL HIGH (ref 11.5–15.5)
RDW: 17.9 % — AB (ref 11.5–15.5)
RDW: 18.3 % — ABNORMAL HIGH (ref 11.5–15.5)
WBC: 31.5 10*3/uL — AB (ref 4.0–10.5)
WBC: 25.6 10*3/uL — AB (ref 4.0–10.5)
WBC: 30.5 10*3/uL — ABNORMAL HIGH (ref 4.0–10.5)

## 2015-06-23 LAB — GLUCOSE, CAPILLARY
GLUCOSE-CAPILLARY: 106 mg/dL — AB (ref 65–99)
GLUCOSE-CAPILLARY: 110 mg/dL — AB (ref 65–99)

## 2015-06-23 LAB — LACTIC ACID, PLASMA
LACTIC ACID, VENOUS: 2 mmol/L (ref 0.5–2.0)
Lactic Acid, Venous: 1.1 mmol/L (ref 0.5–2.0)

## 2015-06-23 LAB — INFLUENZA PANEL BY PCR (TYPE A & B)
H1N1FLUPCR: NOT DETECTED
INFLAPCR: NEGATIVE
INFLBPCR: NEGATIVE

## 2015-06-23 LAB — PROTIME-INR
INR: 1.46 (ref 0.00–1.49)
PROTHROMBIN TIME: 17.8 s — AB (ref 11.6–15.2)

## 2015-06-23 LAB — APTT: aPTT: 36 seconds (ref 24–37)

## 2015-06-23 LAB — I-STAT TROPONIN, ED
TROPONIN I, POC: 0.03 ng/mL (ref 0.00–0.08)
TROPONIN I, POC: 0.01 ng/mL (ref 0.00–0.08)

## 2015-06-23 LAB — POC OCCULT BLOOD, ED: Fecal Occult Bld: POSITIVE — AB

## 2015-06-23 LAB — URINE MICROSCOPIC-ADD ON

## 2015-06-23 LAB — CBG MONITORING, ED: GLUCOSE-CAPILLARY: 112 mg/dL — AB (ref 65–99)

## 2015-06-23 LAB — I-STAT CG4 LACTIC ACID, ED
LACTIC ACID, VENOUS: 4.65 mmol/L — AB (ref 0.5–2.0)
Lactic Acid, Venous: 2.3 mmol/L (ref 0.5–2.0)

## 2015-06-23 LAB — TSH: TSH: 2.185 u[IU]/mL (ref 0.350–4.500)

## 2015-06-23 LAB — BRAIN NATRIURETIC PEPTIDE: B NATRIURETIC PEPTIDE 5: 162.2 pg/mL — AB (ref 0.0–100.0)

## 2015-06-23 LAB — PROCALCITONIN: Procalcitonin: 94.24 ng/mL

## 2015-06-23 LAB — LIPASE, BLOOD: LIPASE: 20 U/L (ref 11–51)

## 2015-06-23 MED ORDER — CEFTRIAXONE SODIUM 2 G IJ SOLR
2.0000 g | INTRAMUSCULAR | Status: DC
  Administered 2015-06-23: 2 g via INTRAVENOUS
  Filled 2015-06-23 (×2): qty 2

## 2015-06-23 MED ORDER — LUBIPROSTONE 8 MCG PO CAPS
8.0000 ug | ORAL_CAPSULE | Freq: Two times a day (BID) | ORAL | Status: DC
  Administered 2015-06-23: 8 ug via ORAL
  Filled 2015-06-23 (×10): qty 1

## 2015-06-23 MED ORDER — ACETAMINOPHEN 650 MG RE SUPP: 650.0000 mg | Freq: Four times a day (QID) | RECTAL | Status: DC | PRN

## 2015-06-23 MED ORDER — OXYCODONE HCL 5 MG PO TABS
15.0000 mg | ORAL_TABLET | Freq: Four times a day (QID) | ORAL | Status: DC | PRN
  Administered 2015-06-23 – 2015-06-27 (×10): 15 mg via ORAL
  Filled 2015-06-23 (×11): qty 3

## 2015-06-23 MED ORDER — TAPENTADOL HCL 50 MG PO TABS: 100.0000 mg | ORAL_TABLET | Freq: Four times a day (QID) | ORAL | Status: DC

## 2015-06-23 MED ORDER — HYDROMORPHONE HCL 1 MG/ML IJ SOLN
0.5000 mg | Freq: Once | INTRAMUSCULAR | Status: AC
  Administered 2015-06-23: 0.5 mg via INTRAVENOUS
  Filled 2015-06-23: qty 1

## 2015-06-23 MED ORDER — VALACYCLOVIR HCL 500 MG PO TABS
1000.0000 mg | ORAL_TABLET | Freq: Two times a day (BID) | ORAL | Status: DC
  Administered 2015-06-23 – 2015-06-27 (×8): 1000 mg via ORAL
  Filled 2015-06-23 (×9): qty 2

## 2015-06-23 MED ORDER — LORAZEPAM 1 MG PO TABS
1.0000 mg | ORAL_TABLET | Freq: Two times a day (BID) | ORAL | Status: DC
  Administered 2015-06-23 – 2015-06-24 (×2): 1 mg via ORAL
  Administered 2015-06-24 – 2015-06-27 (×6): 2 mg via ORAL
  Filled 2015-06-23 (×3): qty 2
  Filled 2015-06-23: qty 1
  Filled 2015-06-23 (×2): qty 2
  Filled 2015-06-23: qty 1
  Filled 2015-06-23: qty 2

## 2015-06-23 MED ORDER — TAPENTADOL HCL 50 MG PO TABS
125.0000 mg | ORAL_TABLET | Freq: Four times a day (QID) | ORAL | Status: DC
  Administered 2015-06-24 – 2015-06-27 (×14): 125 mg via ORAL
  Filled 2015-06-23 (×16): qty 3

## 2015-06-23 MED ORDER — IOPAMIDOL (ISOVUE-300) INJECTION 61%
INTRAVENOUS | Status: AC
  Administered 2015-06-23: 75 mL
  Filled 2015-06-23: qty 75

## 2015-06-23 MED ORDER — INSULIN ASPART 100 UNIT/ML ~~LOC~~ SOLN
0.0000 [IU] | Freq: Three times a day (TID) | SUBCUTANEOUS | Status: DC
  Administered 2015-06-24 – 2015-06-25 (×3): 1 [IU] via SUBCUTANEOUS

## 2015-06-23 MED ORDER — AZITHROMYCIN 500 MG IV SOLR
500.0000 mg | INTRAVENOUS | Status: DC
  Administered 2015-06-24 – 2015-06-25 (×2): 500 mg via INTRAVENOUS
  Filled 2015-06-23 (×2): qty 500

## 2015-06-23 MED ORDER — SODIUM CHLORIDE 0.9 % IV BOLUS (SEPSIS)
500.0000 mL | Freq: Once | INTRAVENOUS | Status: AC
  Administered 2015-06-23: 500 mL via INTRAVENOUS

## 2015-06-23 MED ORDER — DEXTROSE 5 % IV SOLN
1.0000 g | Freq: Once | INTRAVENOUS | Status: AC
  Administered 2015-06-23: 1 g via INTRAVENOUS
  Filled 2015-06-23: qty 10

## 2015-06-23 MED ORDER — VENLAFAXINE HCL ER 75 MG PO CP24
150.0000 mg | ORAL_CAPSULE | Freq: Two times a day (BID) | ORAL | Status: DC
  Administered 2015-06-23 – 2015-06-27 (×8): 150 mg via ORAL
  Filled 2015-06-23: qty 2
  Filled 2015-06-23 (×3): qty 1
  Filled 2015-06-23: qty 2
  Filled 2015-06-23: qty 1
  Filled 2015-06-23: qty 2
  Filled 2015-06-23: qty 1
  Filled 2015-06-23: qty 2

## 2015-06-23 MED ORDER — SODIUM CHLORIDE 0.9 % IV BOLUS (SEPSIS): 1000.0000 mL | Freq: Once | INTRAVENOUS | Status: DC

## 2015-06-23 MED ORDER — GI COCKTAIL ~~LOC~~: 30.0000 mL | Freq: Once | ORAL | Status: DC

## 2015-06-23 MED ORDER — LEVOTHYROXINE SODIUM 75 MCG PO TABS
175.0000 ug | ORAL_TABLET | Freq: Every day | ORAL | Status: DC
  Administered 2015-06-23 – 2015-06-27 (×5): 175 ug via ORAL
  Filled 2015-06-23 (×5): qty 1

## 2015-06-23 MED ORDER — ZIPRASIDONE HCL 40 MG PO CAPS
40.0000 mg | ORAL_CAPSULE | Freq: Every day | ORAL | Status: DC
  Administered 2015-06-23 – 2015-06-26 (×4): 40 mg via ORAL
  Filled 2015-06-23 (×5): qty 1

## 2015-06-23 MED ORDER — ONDANSETRON HCL 4 MG/2ML IJ SOLN: 4.0000 mg | Freq: Four times a day (QID) | INTRAMUSCULAR | Status: DC | PRN

## 2015-06-23 MED ORDER — DEXTROSE 5 % IV SOLN
500.0000 mg | Freq: Once | INTRAVENOUS | Status: AC
  Administered 2015-06-23: 500 mg via INTRAVENOUS
  Filled 2015-06-23: qty 500

## 2015-06-23 MED ORDER — METRONIDAZOLE IN NACL 5-0.79 MG/ML-% IV SOLN
500.0000 mg | Freq: Once | INTRAVENOUS | Status: AC
  Administered 2015-06-23: 500 mg via INTRAVENOUS
  Filled 2015-06-23: qty 100

## 2015-06-23 MED ORDER — HYDROCHLOROTHIAZIDE 12.5 MG PO CAPS
12.5000 mg | ORAL_CAPSULE | Freq: Every day | ORAL | Status: DC
  Administered 2015-06-23 – 2015-06-27 (×5): 12.5 mg via ORAL
  Filled 2015-06-23 (×5): qty 1

## 2015-06-23 MED ORDER — TAPENTADOL HCL ER 250 MG PO TB12: 250.0000 mg | ORAL_TABLET | Freq: Two times a day (BID) | ORAL | Status: DC

## 2015-06-23 MED ORDER — HEPARIN SODIUM (PORCINE) 5000 UNIT/ML IJ SOLN
5000.0000 [IU] | Freq: Three times a day (TID) | INTRAMUSCULAR | Status: DC
Start: 2015-06-23 — End: 2015-06-24
  Administered 2015-06-23 – 2015-06-24 (×2): 5000 [IU] via SUBCUTANEOUS
  Filled 2015-06-23: qty 1

## 2015-06-23 MED ORDER — VALSARTAN-HYDROCHLOROTHIAZIDE 160-12.5 MG PO TABS: 1.0000 | ORAL_TABLET | Freq: Every day | ORAL | Status: DC

## 2015-06-23 MED ORDER — SODIUM CHLORIDE 0.9 % IV SOLN: INTRAVENOUS | Status: DC

## 2015-06-23 MED ORDER — ACETAMINOPHEN 325 MG PO TABS
650.0000 mg | ORAL_TABLET | Freq: Four times a day (QID) | ORAL | Status: DC | PRN
  Filled 2015-06-23: qty 2

## 2015-06-23 MED ORDER — ONDANSETRON HCL 4 MG PO TABS: 4.0000 mg | ORAL_TABLET | Freq: Four times a day (QID) | ORAL | Status: DC | PRN

## 2015-06-23 MED ORDER — ROSUVASTATIN CALCIUM 20 MG PO TABS
20.0000 mg | ORAL_TABLET | Freq: Every day | ORAL | Status: DC
  Administered 2015-06-23 – 2015-06-27 (×5): 20 mg via ORAL
  Filled 2015-06-23 (×5): qty 1

## 2015-06-23 MED ORDER — SODIUM CHLORIDE 0.9 % IV BOLUS (SEPSIS)
1000.0000 mL | Freq: Once | INTRAVENOUS | Status: AC
  Administered 2015-06-23: 1000 mL via INTRAVENOUS

## 2015-06-23 MED ORDER — GABAPENTIN 300 MG PO CAPS
300.0000 mg | ORAL_CAPSULE | Freq: Three times a day (TID) | ORAL | Status: DC
  Administered 2015-06-23 – 2015-06-27 (×12): 300 mg via ORAL
  Filled 2015-06-23 (×13): qty 1

## 2015-06-23 MED ORDER — IRBESARTAN 300 MG PO TABS
150.0000 mg | ORAL_TABLET | Freq: Every day | ORAL | Status: DC
Start: 2015-06-23 — End: 2015-06-27
  Administered 2015-06-23 – 2015-06-27 (×5): 150 mg via ORAL
  Filled 2015-06-23 (×5): qty 1

## 2015-06-23 MED ORDER — SODIUM CHLORIDE 0.9% FLUSH
3.0000 mL | Freq: Two times a day (BID) | INTRAVENOUS | Status: DC
  Administered 2015-06-23 – 2015-06-27 (×6): 3 mL via INTRAVENOUS

## 2015-06-23 MED ORDER — PANTOPRAZOLE SODIUM 40 MG PO TBEC
40.0000 mg | DELAYED_RELEASE_TABLET | Freq: Every day | ORAL | Status: DC
  Administered 2015-06-24 – 2015-06-27 (×4): 40 mg via ORAL
  Filled 2015-06-23 (×4): qty 1

## 2015-06-23 NOTE — Progress Notes (Addendum)
Pharmacy Antibiotic Note  [name] is a 60 y.o. female admitted on 06/23/2015 with CAP.  Pharmacy has been consulted for Rocephin and Azithromycin dosing.  Pt presented with c/o bloody, watery diarrhea and crampy abdominal pain.  A code sepsis was called and Metronidazole was given at that time.  LA and WBC are elevated; she is afebrile.  Plan: Rocephin 2g IV q24 Azithromycin 562m IV q24   Height: _0  (165.1 cm) Weight: (!) 315 lb (142.883 kg) IBW/kg (Calculated) : 57  Temp (24hrs), Avg:98.2 F (36.8 C), Min:98.2 F (36.8 C), Max:98.2 F (36.8 C)   Recent Labs Lab 06/23/15 0754 06/23/15 0836 06/23/15 1140  WBC 31.5*  --   --   CREATININE 1.55*  --   --   LATICACIDVEN  --  4.65* 2.30*    Estimated Creatinine Clearance: 56.4 mL/min (by C-G formula based on Cr of 1.55).    Allergies  Allergen Reactions  . Bee Venom Anaphylaxis  . Shellfish Allergy Anaphylaxis  . Buprenorphine Hcl Other (See Comments)    Overly sedated  . Morphine And Related Other (See Comments)    Overly sedated      Thank you for allowing pharmacy to be a part of this patient's care.  KGracy Bruins PharmD Clinical Pharmacist [facility]

## 2015-06-23 NOTE — ED Notes (Signed)
Patient transported to CT 

## 2015-06-23 NOTE — H&P (Signed)
History and Physical    [name] DTO:[id number] DOB: [birth year] DOA: 06/23/2015  Referring MD/NP/PA: EDP PCP: [name], MD  Outpatient Specialists: Patient Care Team: [name], MD as PCP - General (Internal Medicine) Neldon Mc, MD as Surgeon (General Surgery) Chauncey Cruel, MD as Consulting Physician (Hematology and Oncology) Gery Pray, MD as Consulting Physician (Radiation Oncology) Dannielle Karvonen, RN as [place] Management  Patient coming from:  Home  Chief Complaint: Sepsis  HPI: [name] is a 60 y.o. female with medical history significant for DM, Left Breast Cancer not on treatment, HTN, OSA on CPAP, CKD Stage 3, hypothyroidism, Chronic Back pain, DM2 insulin dependent, Schizophrenia, presenting to the ED with multiple complaints, including 2 day history of bloody watery diarrhea complicated by hemorrhoids accompanied by crampy abdominal pain, as well as shortness of breath on exertion and substernal chest pain without radiation. Patient does not further elaborates on her answers despite further questioning. Denies fevers, but did report chills without night sweats, denies  vision changes, or mucositis. She had one syncopal episode on Thursday due to low blood sugars after using insulin but did not seek medical attention then. Denies lower extremity swelling. Denies nausea or vomiting. Denies abdominal pain. Appetite is decreased since Thursday. Denies any dysuria. Denies abnormal skin rashes, or new neuropathy. Denies any other bleeding issues such as epistaxis, hematemesis, or hematuria. Denies sick contacts or recent long distance travels.   ED Course:  BP 136/73 mmHg  Pulse 101  Temp(Src) 98.2 F (36.8 C) (Oral)  Resp 20  SpO2 96% CXR with Right mid lung heterogeneous opacities. Abd CT without acute process noted, no obstruction or inflammation. Hemoccult positive.  CBC with WBC 31.5 and Hb 10.5 with nl MCV 83, normal   CMET remarkable for Na 128 (137 in 03/2015) Cr 1.55 (Bl 1.1) UA without bacteria o  Tn negative.  Lactic acid 4.65 Anion Gap 16 Glu 132  Blood and stool cultures pending. C diff pending Last colonoscopy 2014 Dr. Michail Sermon only remarkable for internal hemorrhoids Last 2 D echo nl syst fxn, EF 55-60 and Gr2 DD 10/20/14  Review of Systems: As per HPI otherwise 10 point review of systems negative.   Past Medical History  Diagnosis Date  . Depression   . Thyroid disease   . GERD (gastroesophageal reflux disease)   . Pancreatitis     hx of  . Knee pain, bilateral   . Obesity   . HSV (herpes simplex virus) infection   . Hyperlipemia   . Asthma   . Tachycardia     with sx in 2008  . Breast mass in female     L breast 2008, US showed likely fat necrosis  . Anxiety   . Hypertension     sees Dr. Criss Rosales [facility]  . Hx of radiation therapy 05/05/12- 07/15/12    right breast, 45 gray x 25 fx, lumpectomy cavity boosted to 16.2 gray  . History of blood transfusion 2011    "after one of my OR's"  . Hypothyroidism   . 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 Polyneuropathy in diabetes(357.2)   . Benign paroxysmal positional vertigo 12/30/2012  . Breast cancer ([place]) 02/13/12    ruq  100'clock bx Ductal Carcinoma in Situ,(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  . OSA on CPAP     pt does not know settings  . Pneumonia 1950's  . Anemia   . Type II diabetes mellitus ([place])   .  History of hiatal hernia   . History of stomach ulcers   . Daily headache   . Migraines     "~ 3 times/month" (01/06/2014)  . Arthritis     "pretty much all my joints"  . Chronic lower back pain   . Schizophrenia ([place])   . Kidney stones   . CKD (chronic kidney disease), stage III     "lower stage" (01/06/2014)    Past Surgical History  Procedure Laterality Date  . Total knee arthroplasty Bilateral 2009-06/2009    left; right  . Foot fracture surgery Right 1990's  . Shoulder open rotator cuff repair Left 1990's  .  Thyroidectomy  1970's  . Joint replacement    . Abdominal hysterectomy  1979?    partial  . Cholecystectomy  1980's?  . Colonoscopy with propofol N/A 09/28/2012    Procedure: COLONOSCOPY WITH PROPOFOL;  Surgeon: Lear Ng, MD;  Location: [facility];  Service: Endoscopy;  Laterality: N/A;  . Revision total knee arthroplasty  07/2009  . Breast surgery Right 01/2012    "cancer"  . Breast lumpectomy  03/03/2012    Procedure: LUMPECTOMY;  Surgeon: Haywood Lasso, MD;  Location: [place];  Service: General;  Laterality: Right;     reports that she quit smoking about 23 years ago. Her smoking use included Cigarettes. She has a 7 pack-year smoking history. She has never used smokeless tobacco. She reports that she does not drink alcohol or use illicit drugs.  Allergies  Allergen Reactions  . Bee Venom Anaphylaxis  . Shellfish Allergy Anaphylaxis  . Buprenorphine Hcl Other (See Comments)    Overly sedated  . Morphine And Related Other (See Comments)    Overly sedated    Family History  Problem Relation Age of Onset  . Heart disease Brother     Multiple MIs, starting in his 50s  . Diabetes Brother   . Hypertension Mother   . Diabetes Mother   . Breast cancer Mother 31  . Bone cancer Mother   . Hypertension Sister   . Diabetes Sister   . Breast cancer Sister 87  . Breast cancer Maternal Grandmother   . Heart disease Maternal Grandmother   . Uterine cancer Other 19  . Breast cancer Paternal Aunt 42  . Breast cancer Paternal Grandmother     dx in her 84s  . Prostate cancer Paternal Grandfather     Family history reviewed and not pertinent (If you reviewed it)  Prior to Admission medications   Medication Sig Start Date End Date Taking? Authorizing Provider  aspirin EC 81 MG tablet Take 1 tablet (81 mg total) by mouth daily. Patient taking differently: Take 81 mg by mouth at bedtime.  08/12/13   Sheila Oats, MD  cholecalciferol (VITAMIN D) 1000 UNITS tablet Take 1  tablet (1,000 Units total) by mouth daily. 11/18/12   Chauncey Cruel, MD  diclofenac sodium (VOLTAREN) 1 % GEL Apply 2 g topically 2 (two) times daily as needed (pain).     Historical Provider, MD  esomeprazole (NEXIUM) 40 MG capsule Take 1 capsule (40 mg total) by mouth daily before breakfast. 10/30/14   Barton Dubois, MD  ferrous sulfate 325 (65 FE) MG EC tablet Take 325 mg by mouth daily with breakfast.    Historical Provider, MD  gabapentin (NEURONTIN) 100 MG capsule Take 100 mg by mouth 3 (three) times daily. Patient takes 1 tablet in the am and at noon and 3 tablets at bedtime. 10/04/14  Historical Provider, MD  insulin aspart (NOVOLOG) 100 UNIT/ML injection Inject 0-6 Units into the skin 3 (three) times daily as needed for high blood sugar (200-299 = 4 units, 300 and above = 6 units). Sliding scale    Historical Provider, MD  insulin detemir (LEVEMIR) 100 UNIT/ML injection Inject 10 Units into the skin at bedtime.    Historical Provider, MD  Iron-Vitamins (GERITOL COMPLETE) TABS Take 1 tablet by mouth daily.    Historical Provider, MD  levothyroxine (SYNTHROID, LEVOTHROID) 150 MCG tablet Take 150 mcg by mouth daily before breakfast.    Historical Provider, MD  LORazepam (ATIVAN) 2 MG tablet Take 1 tablet (2 mg total) by mouth every 12 (twelve) hours as needed for anxiety. 11/29/14   Gildardo Cranker, DO  lubiprostone (AMITIZA) 8 MCG capsule Take 8 mcg by mouth 2 (two) times daily with a meal.    Historical Provider, MD  meloxicam (MOBIC) 15 MG tablet Take 15 mg by mouth daily.     Historical Provider, MD  NUCYNTA ER 200 MG TB12 Take 200 mg by mouth every 12 (twelve) hours. 11/16/14   Lauree Chandler, NP  ondansetron (ZOFRAN) 4 MG tablet Take 1 tablet (4 mg total) by mouth every 8 (eight) hours as needed for nausea or vomiting. 11/15/14   Jonetta Osgood, MD  oxyCODONE (ROXICODONE) 15 MG immediate release tablet Take 1 tablet (15 mg total) by mouth every 6 (six) hours as needed for pain. 11/29/14    Gildardo Cranker, DO  polyethylene glycol [facility]) packet Take 17 g by mouth daily. Patient not taking: Reported on 04/03/2015 11/15/14   Jonetta Osgood, MD  pregabalin (LYRICA) 150 MG capsule Take 1 capsule (150 mg total) by mouth 2 (two) times daily. 11/15/14   Shanker Kristeen Mans, MD  rosuvastatin (CRESTOR) 20 MG tablet Take 20 mg by mouth daily.    Historical Provider, MD  senna-docusate (SENOKOT-S) 8.6-50 MG tablet Take 2 tablets by mouth at bedtime. Patient not taking: Reported on 04/03/2015 11/15/14   Jonetta Osgood, MD  sitaGLIPtin-metformin (JANUMET) 50-500 MG per tablet Take 1 tablet by mouth 2 (two) times daily with a meal.    Historical Provider, MD  valACYclovir (VALTREX) 1000 MG tablet Take 1,000 mg by mouth 2 (two) times daily.     Historical Provider, MD  Venlafaxine HCl 150 MG TB24 Take 150 mg by mouth daily.    Historical Provider, MD  ziprasidone (GEODON) 20 MG capsule Take 20 mg by mouth every evening.    Historical Provider, MD    Physical Exam:    Filed Vitals:   06/23/15 1045 06/23/15 1100 06/23/15 1115 06/23/15 1118  BP: 134/85 130/85 136/73   Pulse: 102 101 101   Temp:      TempSrc:      Resp:    20  SpO2: 96% 96% 96%       Constitutional: NAD, but anxious, uncomfortable appearing  Filed Vitals:   06/23/15 1045 06/23/15 1100 06/23/15 1115 06/23/15 1118  BP: 134/85 130/85 136/73   Pulse: 102 101 101   Temp:      TempSrc:      Resp:    20  SpO2: 96% 96% 96%    Eyes: PERRL, lids and conjunctivae normal ENMT: Mucous membranes are moist. Posterior pharynx clear of any exudate or lesions. Normal dentition.  Neck: normal, supple, no masses, no thyromegaly Respiratory: distant sounds due to body habitus, cannot perceive  wheezing, no crackles or rhonchi. Normal respiratory effort.  No accessory muscle use.  Cardiovascular: Tachy but Regular rate and rhythm, no murmurs / rubs / gallops. No extremity edema. 2+ pedal pulses. No carotid bruits.  Abdomen: Obese,  no tenderness, no masses palpated. No hepatosplenomegaly. Bowel sounds positive, active.  Musculoskeletal: no clubbing / cyanosis. No joint deformity upper and lower extremities. Good ROM, no contractures. Normal muscle tone.  Skin: no rashes, lesions, ulcers. No induration Neurologic: CN 2-12 grossly intact. Sensation intact, DTR normal. Strength 5/5 in all 4.  Psychiatric: PAtient has schizophrenia, some flight of ideas are noted on exam. Alert and oriented x 3. Anxious mood.     Labs on Admission: I have personally reviewed following labs and imaging studies  CBC:  Recent Labs Lab 06/23/15 0754  WBC 31.5*  HGB 10.5*  HCT 31.2*  MCV 83.6  PLT 846    Basic Metabolic Panel:  Recent Labs Lab 06/23/15 0754  NA 128*  K 4.3  CL 92*  CO2 20*  GLUCOSE 132*  BUN 27*  CREATININE 1.55*  CALCIUM 9.1    GFR: CrCl cannot be calculated (Unknown ideal weight.).  Liver Function Tests:  Recent Labs Lab 06/23/15 0754  AST 39  ALT 50  ALKPHOS 79  BILITOT 0.6  PROT 6.1*  ALBUMIN 2.5*    Recent Labs Lab 06/23/15 0754  LIPASE 20   Urine analysis:    Component Value Date/Time   COLORURINE YELLOW 06/23/2015 0830   COLORURINE Yellow 11/13/2013 1310   APPEARANCEUR CLEAR 06/23/2015 0830   APPEARANCEUR Clear 11/13/2013 1310   LABSPEC 1.010 06/23/2015 0830   LABSPEC 1.017 11/13/2013 1310   PHURINE 7.0 06/23/2015 0830   PHURINE 5.0 11/13/2013 1310   GLUCOSEU NEGATIVE 06/23/2015 0830   GLUCOSEU Negative 11/13/2013 1310   GLUCOSEU NEG mg/dL 03/02/2006 2050   HGBUR LARGE* 06/23/2015 0830   HGBUR Negative 11/13/2013 1310   BILIRUBINUR NEGATIVE 06/23/2015 0830   BILIRUBINUR Negative 11/13/2013 1310   KETONESUR NEGATIVE 06/23/2015 0830   KETONESUR Negative 11/13/2013 1310   PROTEINUR NEGATIVE 06/23/2015 0830   PROTEINUR Negative 11/13/2013 1310   UROBILINOGEN 0.2 11/12/2014 2057   NITRITE NEGATIVE 06/23/2015 0830   NITRITE Negative 11/13/2013 1310   LEUKOCYTESUR  NEGATIVE 06/23/2015 0830   LEUKOCYTESUR Trace 11/13/2013 1310    Sepsis Labs: _0 (procalcitonin:4,lacticidven:4) )No results found for this or any previous visit (from the past 240 hour(s)).   Radiological Exams on Admission: Dg Chest 1 View  06/23/2015  CLINICAL DATA:  Patient status post fall in the bathroom. Lower back pain. Code sepsis. EXAM: CHEST 1 VIEW COMPARISON:  Chest radiograph 11/12/2014 FINDINGS: Surgical clips within the cervical soft tissue. Stable enlarged cardiac and mediastinal contours. Right mid lung heterogeneous opacities. Low lung volumes. Left basilar atelectasis. No pleural effusion or pneumothorax. IMPRESSION: Focal consolidation within the right mid lung concerning for pneumonia. Followup PA and lateral chest X-ray is recommended in 3-4 weeks following trial of antibiotic therapy to ensure resolution and exclude underlying malignancy. Electronically Signed   By: Lovey Newcomer M.D.   On: 06/23/2015 09:26   Dg Lumbar Spine Complete  06/23/2015  CLINICAL DATA:  Lumbago EXAM: LUMBAR SPINE - COMPLETE 4+ VIEW COMPARISON:  Lumbar spine series August 14, 2014 and lumbar MRI August 14, 2014 FINDINGS: Frontal, lateral, spot lumbosacral lateral, and bilateral oblique views were obtained. There are 5 non-rib-bearing lumbar type vertebral bodies. There is no fracture or spondylolisthesis. There is mild disc space narrowing at L3-4, L4-5, and L5-S1. There is facet osteoarthritic change at L5-S1 bilaterally. IMPRESSION:  Areas of osteoarthritic change, essentially stable. No fracture or spondylolisthesis. Electronically Signed   By: Lowella Grip III M.D.   On: 06/23/2015 09:26   Ct Abdomen Pelvis W Contrast  06/23/2015  CLINICAL DATA:  Patient with mid abdominal pain, nausea vomiting and diarrhea. EXAM: CT ABDOMEN AND PELVIS WITH CONTRAST TECHNIQUE: Multidetector CT imaging of the abdomen and pelvis was performed using the standard protocol following bolus administration of intravenous  contrast. CONTRAST:  75cc ISOVUE-300 IOPAMIDOL (ISOVUE-300) INJECTION 61% COMPARISON:  None. FINDINGS: Lower chest: There are ground-glass and consolidative opacities within the lower lobes bilaterally. Heart is enlarged. Small hiatal hernia. Hepatobiliary: Liver is normal in size and contour. No focal lesion is identified. Patient status post cholecystectomy. Pancreas: Fatty atrophy of the pancreatic parenchyma. Spleen: Unremarkable Adrenals/Urinary Tract: Normal adrenal glands. Kidneys enhance symmetrically with contrast. No hydronephrosis. Urinary bladder is unremarkable. Stomach/Bowel: No abnormal bowel wall thickening or evidence for bowel obstruction. No free fluid or free intraperitoneal air. Vascular/Lymphatic: Normal caliber abdominal aorta. No retroperitoneal lymphadenopathy. Other: Uterus is surgically absent. Adnexal structures are unremarkable. Musculoskeletal: Lumbar spine degenerative changes. No aggressive or acute appearing osseous lesions. IMPRESSION: . Right mid lung heterogeneous opacities in the appropriate clinical setting. Recommend radiographic follow-up in 3- 4 weeks to ensure resolution. No acute process within the abdomen or pelvis. Electronically Signed   By: Lovey Newcomer M.D.   On: 06/23/2015 10:14    EKG: Independently reviewed.  Assessment/Plan Active Problems:   Hypothyroidism   HLD (hyperlipidemia)   OBESITY   Depression   Essential hypertension   Asthma   GERD   Hx of radiation therapy   Breast cancer of upper-outer quadrant of right female breast (HCC)   Secondary parkinsonism (HCC)   Benign paroxysmal positional vertigo   Type II diabetes mellitus (HCC)   OSA on CPAP   Chronic lower back pain   Acute encephalopathy   CKD (chronic kidney disease), stage III   Diarrhea      Sepsis of Unclear Etiology. In the setting of RLL HCAP. Rule out  Urine source, pressure ulcer. Patient meets sepsis criteria based on vital sign changes lactic acidosis and hypoxia.  Patient's acute respiratory failure likely secondary to RLL pneumonia which isseen on cxr. UA pending, Initial Lactic acid  . T max   CBC remarkable for leukocytosis, WBC   patient's ABG notable for pH   , PCO2  , PO2  , bicarbonate  . EKG Sinus Tach, QTC  . Will place patient on oxygen and titrate O2 amount and delivery system as needed. She is on 8 l NRB. Received   l NS to date Sepsis order set  Droplet precaution IV antibiotics by pharmacy with Aztreonam and Vanco Xopenex Follow lactic acid Follow blood and urine cultures IV fluids at 100 cc/h.  Procalcitonin UA, cultures Code SEPSIS called to E-link   Sepsis, unclear etiology. Suspected source . Organism unknown. Patient meets criteria given tachycardia, tachypnea, fever, leukocytosis, and evidence of organ dysfunction.  Lactate mmol/L and repeat ordered within 6 hours.  This patient is/is not at high risk of poor outcomes with a   Antibiotics delivered in the ED.    Presumed Sepsis/SIRS, likely related to CAP and in the setting of  Bloody diarrhea CXR with Right mid lung heterogeneous opacities. Abd CT without acute process noted, no obstruction or inflammation. Hemoccult positive.   WBC 31.5 ,Lactic acid 4.65  Admit to stepdown Sepsis order set  IV antibiotics by pharmacy with [place]  Follow lactic acid  ABG  Follow blood and urine cultures IV fluids at 100 cc/h.  Procalcitonin   Anemia Probably lower GIB :Last colonoscopy Last colonoscopy 2014 Dr. Michail Sermon only remarkable for internal hemorrhoids Hb 10.5 . Hcult positive - Protonix 70m IV  Hold NSAIDS - Telemetry - GI consultation if bleeding continues Transfuse if Hb less than 8  Diarrhea with abdominal pain: Unknown etiology, rule out organism versus metabolic. Hemoccult positive Cdiff pending CT abdomen negative for acute findings. Received 1 dose Flagyl  Last colonoscopy 2014 Dr. SMichail Sermononly remarkable for internal hemorrhoids Lactoferrin and  probiotics added to orders.  - Enteric precautions - C. Diff, H pilori and Lipase - Stool culture - Clear liquid diet and advance as tolerated - Okay to start imodium if C. Diff is negative  KUB   Continue IV hydration    Chest pain syndrome vs musculoskeletal ves 2nd to PNA versus anxiety   Troponin negative History of  Gr2 DCHF  CP is now improved since admission, without ASA or Ntg.  CXR remarkable for RML PNA  Cycle troponin EKG  ASA, O2 and NTG as needed Continue preadmission asa and statin GI cocktail   Chronic diastolic heart failure, last echocardiogram Last 2 D echo nl syst fxn, EF 55-60 and Gr2 DD 10/20/14 - Careful use of IVF - Daily weights, strict I/O  Repeat 2 D echo   Hyperlipidemia Continue home statins    Type II Diabetes  Current blood sugar level is 132 Lab Results  Component Value Date   HGBA1C 7.5* 10/28/2014  Repeat Hgb A1C Hold home oral diabetic medications.  SSI Heart healthy carb modified diet when she is able to tolerate POs   Hypertension BP 136/73 mmHg  Pulse 101  Temp(Src) 98.2 F (36.8 C) (Oral)  Resp 20  SpO2 96% Controlled Continue home anti-hypertensive medications.  Add Hydralazine Q6 hours as needed for SBP >160 and /or DBP >110.    Leukocytosis, likely reactive,  related to underlying infection and pain Current WBC 31 Cultures pending -  abx as above -  Repeat WBC in AM  Schizophrenia/ Anxiety/ Depression Continue meds  History of L breast Cancer, not on therapy Will continue to be seen as OP as shceduled.   Hypothyroidism: Chronic . -Continue home Synthroid   Hyponatremia likely due to polydipsia vs. meds vs volume loss due to diarrhea No new neurological deficitis. Current NA  128    Repeat BMET in am -  TSH Consider SIADH w/u if values do not improve   Chronic kidney disease stage  In the setting ov volume loss IIIB 30-44  baseline creatinine 1.1    -  Minimize nephrotoxins and renally dose  medications Lab Results  Component Value Date   CREATININE 1.55* 06/23/2015   CREATININE 1.1 03/26/2015   CREATININE 1.11* 11/14/2014  Continue IVF    Deconditioning Consult PT/OT/Nutrition  DVT prophylaxis:  Heparin  Code Status:   Full    Family Communication:  Discussed with patient Disposition Plan: Expect patient to be discharged to home Consults called:    None Admission status: SDU   Mikal Wisman E, PA-C [facility]   If 7PM-7AM, please contact night-coverage [email or web address] Password [email or web address]  06/23/2015, 11:29 AM

## 2015-06-23 NOTE — ED Notes (Signed)
Patient transported to X-ray 

## 2015-06-23 NOTE — ED Notes (Addendum)
Per ems- pt comes from home with multiple complaints today. Starting on Thursday she was prescribed new insulin and took 30 units of humalog. She then fainted. Reports this was the wrong dose and was told incorrectly to take it. Has also been having n/v/d with some rectal bleeding, last episode was last night. Has central CP. Also, fell this morning c/o pain to her lower back where she has several herniated discs. Has cancer to her left breast, but is finished with treatment. Pt is anxious, tachypneic from back pain she reports. CBG 140. BP 435 systolic, HR 686.

## 2015-06-23 NOTE — ED Notes (Signed)
CHECKED CBG 112, INFORMED RN [place] AND RN CHRIS

## 2015-06-23 NOTE — ED Provider Notes (Signed)
CSN: [id number]     Arrival date & time 06/23/15  0707 History   First MD Initiated Contact with Patient 06/23/15 [phone]     Chief Complaint  Patient presents with  . Abdominal Pain     (Consider location/radiation/quality/duration/timing/severity/associated sxs/prior Treatment) HPI Comments: 60 year old female with complicated past medical history including insulin-dependent diabetes, hypertension, hypothyroidism presents for multiple complaints. The patient reports that she lives alone at home and is coming from home. She states that on Thursday she was prescribed new insulin and took 30 units of Humalog and then that she then passed out. She reports she was able to get herself to her bedroom into her bed and so did not call EMS. She says that she has been in bed since that time. She does say at some point she got up to go the bathroom known fell and hurt her back and her stomach. She is not sure how she fell and can explain anything more about it. She also reports that she's had diarrhea but cannot tell me for how long. She says today she noted blood in her diarrhea. She says it was bright red. She does report this happen before and she was told that it was a hemorrhoid. She also states that she's been having chest pain she says is in the center of her chest. Denies any radiation. Reports feeling short of breath with it. Denies cough or fever. Is not able to characterize the chest pain any better. Patient does also have a history of left breast cancer but has finished all of her treatment.  Patient is a 60 y.o. female presenting with abdominal pain.  Abdominal Pain Associated symptoms: chest pain, diarrhea, fatigue and shortness of breath   Associated symptoms: no chills, no constipation, no cough, no dysuria, no fever, no hematuria, no nausea and no vomiting     Past Medical History  Diagnosis Date  . Depression   . Thyroid disease   . GERD (gastroesophageal reflux disease)   . Pancreatitis      hx of  . Knee pain, bilateral   . Obesity   . HSV (herpes simplex virus) infection   . Hyperlipemia   . Asthma   . Tachycardia     with sx in 2008  . Breast mass in female     L breast 2008, US showed likely fat necrosis  . Anxiety   . Hypertension     sees Dr. Criss Rosales , [facility] [place]  . Hx of radiation therapy 05/05/12- 07/15/12    right breast, 45 gray x 25 fx, lumpectomy cavity boosted to 16.2 gray  . History of blood transfusion 2011    "after one of my OR's"  . Hypothyroidism   . 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 Polyneuropathy in diabetes(357.2)   . Benign paroxysmal positional vertigo 12/30/2012  . Breast cancer ([place]) 02/13/12    ruq  100'clock bx Ductal Carcinoma in Situ,(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  . OSA on CPAP     pt does not know settings  . Pneumonia 1950's  . Anemia   . Type II diabetes mellitus ([place])   . History of hiatal hernia   . History of stomach ulcers   . Daily headache   . Migraines     "~ 3 times/month" (01/06/2014)  . Arthritis     "pretty much all my joints"  . Chronic lower back pain   . Schizophrenia ([place])   . Kidney stones   . CKD (chronic kidney  disease), stage III     "lower stage" (01/06/2014)   Past Surgical History  Procedure Laterality Date  . Total knee arthroplasty Bilateral 2009-06/2009    left; right  . Foot fracture surgery Right 1990's  . Shoulder open rotator cuff repair Left 1990's  . Thyroidectomy  1970's  . Joint replacement    . Abdominal hysterectomy  1979?    partial  . Cholecystectomy  1980's?  . Colonoscopy with propofol N/A 09/28/2012    Procedure: COLONOSCOPY WITH PROPOFOL;  Surgeon: Lear Ng, MD;  Location: [facility];  Service: Endoscopy;  Laterality: N/A;  . Revision total knee arthroplasty  07/2009  . Breast surgery Right 01/2012    "cancer"  . Breast lumpectomy  03/03/2012    Procedure: LUMPECTOMY;  Surgeon: Haywood Lasso, MD;  Location: [facility] OR;  Service: General;  Laterality: Right;    Family History  Problem Relation Age of Onset  . Heart disease Brother     Multiple MIs, starting in his 25s  . Diabetes Brother   . Hypertension Mother   . Diabetes Mother   . Breast cancer Mother 7  . Bone cancer Mother   . Hypertension Sister   . Diabetes Sister   . Breast cancer Sister 64  . Breast cancer Maternal Grandmother   . Heart disease Maternal Grandmother   . Uterine cancer Other 19  . Breast cancer Paternal Aunt 59  . Breast cancer Paternal Grandmother     dx in her 9s  . Prostate cancer Paternal Grandfather    Social History  Substance Use Topics  . Smoking status: Former Smoker -- 1.00 packs/day for 7 years    Types: Cigarettes    Quit date: 03/24/1992  . Smokeless tobacco: Never Used  . Alcohol Use: No     Comment: "stopped drinking in 1996; I was an alcoholic"   OB History    No data available     Review of Systems  Constitutional: Positive for fatigue. Negative for fever and chills.  HENT: Negative for congestion, postnasal drip and rhinorrhea.   Eyes: Negative for visual disturbance.  Respiratory: Positive for shortness of breath. Negative for cough, chest tightness and wheezing.   Cardiovascular: Positive for chest pain. Negative for palpitations and leg swelling.  Gastrointestinal: Positive for abdominal pain, diarrhea and blood in stool. Negative for nausea, vomiting and constipation.  Genitourinary: Negative for dysuria, urgency and hematuria.  Musculoskeletal: Positive for back pain. Negative for myalgias, neck pain and neck stiffness.  Skin: Negative for rash.  Neurological: Positive for syncope. Negative for dizziness, speech difficulty, weakness and headaches.  Hematological: Does not bruise/bleed easily.      Allergies  Bee venom; Shellfish allergy; Buprenorphine hcl; and Morphine and related  Home Medications   Prior to Admission medications   Medication Sig Start Date End Date Taking? Authorizing Provider  aspirin EC 81 MG  tablet Take 1 tablet (81 mg total) by mouth daily. Patient taking differently: Take 81 mg by mouth at bedtime.  08/12/13  Yes Adeline Saralyn Pilar, MD  diclofenac sodium (VOLTAREN) 1 % GEL Apply 2 g topically 2 (two) times daily as needed (pain).    Yes Historical Provider, MD  esomeprazole (NEXIUM) 40 MG capsule Take 1 capsule (40 mg total) by mouth daily before breakfast. Patient taking differently: Take 40 mg by mouth 2 (two) times daily before a meal.  10/30/14  Yes Barton Dubois, MD  gabapentin (NEURONTIN) 100 MG capsule Take 300 mg by mouth 3 (  three) times daily.  10/04/14  Yes Historical Provider, MD  Insulin Detemir (LEVEMIR FLEXTOUCH) 100 UNIT/ML Pen Inject 10 Units into the skin at bedtime.   Yes Historical Provider, MD  insulin lispro (HUMALOG KWIKPEN) 100 UNIT/ML KiwkPen Inject 10 Units into the skin 3 (three) times daily.   Yes Historical Provider, MD  Iron-Vitamins (GERITOL COMPLETE) TABS Take 1 tablet by mouth daily.   Yes Historical Provider, MD  levothyroxine (SYNTHROID, LEVOTHROID) 175 MCG tablet Take 175 mcg by mouth daily. 06/18/15  Yes Historical Provider, MD  LORazepam (ATIVAN) 2 MG tablet Take 1 tablet (2 mg total) by mouth every 12 (twelve) hours as needed for anxiety. Patient taking differently: Take 1-2 mg by mouth 2 (two) times daily. Take 1/2 tablet (1 mg) by mouth every morning and 1 tablet (2 mg) at bedtime 11/29/14  Yes Gildardo Cranker, DO  lubiprostone (AMITIZA) 8 MCG capsule Take 8 mcg by mouth 2 (two) times daily with a meal.   Yes Historical Provider, MD  meloxicam (MOBIC) 15 MG tablet Take 15 mg by mouth daily.    Yes Historical Provider, MD  oxyCODONE (ROXICODONE) 15 MG immediate release tablet Take 1 tablet (15 mg total) by mouth every 6 (six) hours as needed for pain. 11/29/14  Yes Gildardo Cranker, DO  rosuvastatin (CRESTOR) 20 MG tablet Take 20 mg by mouth daily.   Yes Historical Provider, MD  sitaGLIPtin-metformin (JANUMET) 50-500 MG per tablet Take 1 tablet by mouth 2  (two) times daily with a meal.   Yes Historical Provider, MD  Tapentadol HCl (NUCYNTA ER) 250 MG TB12 Take 250 mg by mouth every 12 (twelve) hours.   Yes Historical Provider, MD  valACYclovir (VALTREX) 1000 MG tablet Take 1,000 mg by mouth 2 (two) times daily.    Yes Historical Provider, MD  valsartan-hydrochlorothiazide (DIOVAN-HCT) 160-12.5 MG tablet Take 1 tablet by mouth daily. 04/04/15  Yes Historical Provider, MD  venlafaxine XR (EFFEXOR-XR) 150 MG 24 hr capsule Take 150 mg by mouth 2 (two) times daily. 05/21/15  Yes Historical Provider, MD  ziprasidone (GEODON) 40 MG capsule Take 40 mg by mouth daily after supper. 05/21/15  Yes Historical Provider, MD  cholecalciferol (VITAMIN D) 1000 UNITS tablet Take 1 tablet (1,000 Units total) by mouth daily. Patient not taking: Reported on 06/23/2015 11/18/12   Chauncey Cruel, MD  ondansetron (ZOFRAN) 4 MG tablet Take 1 tablet (4 mg total) by mouth every 8 (eight) hours as needed for nausea or vomiting. Patient not taking: Reported on 06/23/2015 11/15/14   Jonetta Osgood, MD  polyethylene glycol [facility]) packet Take 17 g by mouth daily. Patient not taking: Reported on 04/03/2015 11/15/14   Jonetta Osgood, MD  pregabalin (LYRICA) 150 MG capsule Take 1 capsule (150 mg total) by mouth 2 (two) times daily. Patient not taking: Reported on 06/23/2015 11/15/14   Jonetta Osgood, MD  senna-docusate (SENOKOT-S) 8.6-50 MG tablet Take 2 tablets by mouth at bedtime. Patient not taking: Reported on 04/03/2015 11/15/14   Jonetta Osgood, MD   BP 132/85 mmHg  Pulse 101  Temp(Src) 98.2 F (36.8 C) (Oral)  Resp 20  Ht _0  (1.651 m)  Wt 315 lb (142.883 kg)  BMI 52.42 kg/m2  SpO2 97% Physical Exam  Constitutional: She is oriented to person, place, and time. She appears well-developed and well-nourished. No distress.  Obese  HENT:  Head: Normocephalic and atraumatic.  Right Ear: External ear normal.  Left Ear: External ear normal.  Nose: Nose normal.  Mouth/Throat: Oropharynx is clear and moist. No oropharyngeal exudate.  Eyes: EOM are normal. Pupils are equal, round, and reactive to light.  Neck: Normal range of motion. Neck supple.  Cardiovascular: Regular rhythm and intact distal pulses.  Tachycardia present.   Pulmonary/Chest: Effort normal. No respiratory distress. She has no wheezes. She has no rales.  Abdominal: Soft. She exhibits no distension. There is no tenderness.  Musculoskeletal: She exhibits no edema.       Lumbar back: She exhibits decreased range of motion, tenderness and pain. She exhibits no bony tenderness, no swelling and no edema.  Patient reports pain in her lower back with straight leg raise on both sides  Neurological: She is alert and oriented to person, place, and time. No cranial nerve deficit. She exhibits normal muscle tone.  Skin: Skin is warm and dry. No rash noted. She is not diaphoretic.  Psychiatric: Her affect is blunt. She is slowed. She is not actively hallucinating. Thought content is not paranoid. She expresses no homicidal and no suicidal ideation. She is attentive.  Vitals reviewed.   ED Course  Procedures (including critical care time) Labs Review Labs Reviewed  COMPREHENSIVE METABOLIC PANEL - Abnormal; Notable for the following:    Sodium 128 (*)    Chloride 92 (*)    CO2 20 (*)    Glucose, Bld 132 (*)    BUN 27 (*)    Creatinine, Ser 1.55 (*)    Total Protein 6.1 (*)    Albumin 2.5 (*)    GFR calc non Af Amer 36 (*)    GFR calc Af Amer 41 (*)    Anion gap 16 (*)    All other components within normal limits  CBC - Abnormal; Notable for the following:    WBC 31.5 (*)    RBC 3.73 (*)    Hemoglobin 10.5 (*)    HCT 31.2 (*)    RDW 18.2 (*)    All other components within normal limits  URINALYSIS, ROUTINE W REFLEX MICROSCOPIC (NOT AT [facility]) - Abnormal; Notable for the following:    Hgb urine dipstick LARGE (*)    All other components within normal limits  URINE MICROSCOPIC-ADD ON -  Abnormal; Notable for the following:    Squamous Epithelial / LPF 6-30 (*)    Bacteria, UA RARE (*)    All other components within normal limits  PROTIME-INR - Abnormal; Notable for the following:    Prothrombin Time 17.8 (*)    All other components within normal limits  I-STAT CG4 LACTIC ACID, ED - Abnormal; Notable for the following:    Lactic Acid, Venous 4.65 (*)    All other components within normal limits  POC OCCULT BLOOD, ED - Abnormal; Notable for the following:    Fecal Occult Bld POSITIVE (*)    All other components within normal limits  I-STAT CG4 LACTIC ACID, ED - Abnormal; Notable for the following:    Lactic Acid, Venous 2.30 (*)    All other components within normal limits  GASTROINTESTINAL PANEL BY PCR, STOOL (REPLACES STOOL CULTURE)  CULTURE, BLOOD (ROUTINE X 2)  CULTURE, BLOOD (ROUTINE X 2)  URINE CULTURE  CULTURE, BLOOD (ROUTINE X 2)  CULTURE, BLOOD (ROUTINE X 2)  CULTURE, EXPECTORATED SPUTUM-ASSESSMENT  GRAM STAIN  CULTURE, BLOOD (ROUTINE X 2)  CULTURE, BLOOD (ROUTINE X 2)  LIPASE, BLOOD  APTT  HIV ANTIBODY (ROUTINE TESTING)  STREP PNEUMONIAE URINARY ANTIGEN  COMPREHENSIVE METABOLIC PANEL  LACTIC ACID, PLASMA  LACTIC ACID,  PLASMA  PROCALCITONIN  HEMOGLOBIN A1C  LEGIONELLA PNEUMOPHILA SEROGP 1 UR AG  INFLUENZA PANEL BY PCR (TYPE A & B, H1N1)  TSH  BRAIN NATRIURETIC PEPTIDE  CBC  CBC  I-STAT TROPOININ, ED    Imaging Review Dg Chest 1 View  06/23/2015  CLINICAL DATA:  Patient status post fall in the bathroom. Lower back pain. Code sepsis. EXAM: CHEST 1 VIEW COMPARISON:  Chest radiograph 11/12/2014 FINDINGS: Surgical clips within the cervical soft tissue. Stable enlarged cardiac and mediastinal contours. Right mid lung heterogeneous opacities. Low lung volumes. Left basilar atelectasis. No pleural effusion or pneumothorax. IMPRESSION: Focal consolidation within the right mid lung concerning for pneumonia. Followup PA and lateral chest X-ray is recommended  in 3-4 weeks following trial of antibiotic therapy to ensure resolution and exclude underlying malignancy. Electronically Signed   By: Lovey Newcomer M.D.   On: 06/23/2015 09:26   Dg Lumbar Spine Complete  06/23/2015  CLINICAL DATA:  Lumbago EXAM: LUMBAR SPINE - COMPLETE 4+ VIEW COMPARISON:  Lumbar spine series August 14, 2014 and lumbar MRI August 14, 2014 FINDINGS: Frontal, lateral, spot lumbosacral lateral, and bilateral oblique views were obtained. There are 5 non-rib-bearing lumbar type vertebral bodies. There is no fracture or spondylolisthesis. There is mild disc space narrowing at L3-4, L4-5, and L5-S1. There is facet osteoarthritic change at L5-S1 bilaterally. IMPRESSION: Areas of osteoarthritic change, essentially stable. No fracture or spondylolisthesis. Electronically Signed   By: Lowella Grip III M.D.   On: 06/23/2015 09:26   Ct Abdomen Pelvis W Contrast  06/23/2015  CLINICAL DATA:  Patient with mid abdominal pain, nausea vomiting and diarrhea. EXAM: CT ABDOMEN AND PELVIS WITH CONTRAST TECHNIQUE: Multidetector CT imaging of the abdomen and pelvis was performed using the standard protocol following bolus administration of intravenous contrast. CONTRAST:  75cc ISOVUE-300 IOPAMIDOL (ISOVUE-300) INJECTION 61% COMPARISON:  None. FINDINGS: Lower chest: There are ground-glass and consolidative opacities within the lower lobes bilaterally. Heart is enlarged. Small hiatal hernia. Hepatobiliary: Liver is normal in size and contour. No focal lesion is identified. Patient status post cholecystectomy. Pancreas: Fatty atrophy of the pancreatic parenchyma. Spleen: Unremarkable Adrenals/Urinary Tract: Normal adrenal glands. Kidneys enhance symmetrically with contrast. No hydronephrosis. Urinary bladder is unremarkable. Stomach/Bowel: No abnormal bowel wall thickening or evidence for bowel obstruction. No free fluid or free intraperitoneal air. Vascular/Lymphatic: Normal caliber abdominal aorta. No retroperitoneal  lymphadenopathy. Other: Uterus is surgically absent. Adnexal structures are unremarkable. Musculoskeletal: Lumbar spine degenerative changes. No aggressive or acute appearing osseous lesions. IMPRESSION: Ground-glass and consolidative opacities within the lower lobes bilaterally may represent pneumonia in the appropriate clinical setting. Recommend radiographic follow-up in 3- 4 weeks to ensure resolution. No acute process within the abdomen or pelvis. Electronically Signed   By: Lovey Newcomer M.D.   On: 06/23/2015 10:14   I have personally reviewed and evaluated these images and lab results as part of my medical decision-making.   EKG Interpretation   Date/Time:  Saturday Jun 23 2015 07:20:35 EDT Ventricular Rate:  107 PR Interval:  165 QRS Duration: 74 QT Interval:  334 QTC Calculation: 446 R Axis:   5 Text Interpretation:  Sinus tachycardia Probable left atrial enlargement  RSR' in V1 or V2, probably normal variant Borderline T abnormalities,  anterior leads No significant change since last tracing Confirmed by  Lonia Skinner ([id number]) on 06/23/2015 7:28:48 AM Also confirmed by Lonia Skinner ([id number]), editor Lorenda Cahill CT, Leda Gauze [phone])  on 06/23/2015 1:17:02 PM      MDM  Patient was  seen and evaluated at bedside. Patient was a poor historian. Multiple complaints. She was afebrile but tachycardic on arrival. Initial results showed an elevated white blood cell count of over 30,000. Chest x-ray concerning for possible pneumonia. Patient continued to complain mostly of abdominal pain though. There was some concern for possible C. difficile in light of her reported blood in her stools and diarrhea. For this reason code sepsis was called and patient initially was given a dose of Flagyl in case this was C. difficile. CT of the abdomen and pelvis did not show anything acute in the abdomen or pelvis but was consistent with pneumonia. Antibiotics were switched to Rocephin and azithromycin. Patient was fluid  resuscitated with 3 L. She appeared to be getting somewhat fluid overloaded and so the fourth liter was canceled at this time. The case was discussed with the NP, Clarise Cruz, on-call for admissions. She agreed with admission and patient was admitted to the stepdown unit under the care of the medicine team. Final diagnoses:  CAP (community acquired pneumonia)    1. Sepsis  2. CAP    Harvel Quale, MD 06/23/15 1407

## 2015-06-23 NOTE — ED Notes (Signed)
Admitting MD at bedside.

## 2015-06-24 ENCOUNTER — Inpatient Hospital Stay (HOSPITAL_COMMUNITY): Payer: Medicare Other

## 2015-06-24 DIAGNOSIS — G8929 Other chronic pain: Secondary | ICD-10-CM

## 2015-06-24 DIAGNOSIS — M545 Low back pain: Secondary | ICD-10-CM

## 2015-06-24 DIAGNOSIS — E1122 Type 2 diabetes mellitus with diabetic chronic kidney disease: Secondary | ICD-10-CM

## 2015-06-24 DIAGNOSIS — E02 Subclinical iodine-deficiency hypothyroidism: Secondary | ICD-10-CM

## 2015-06-24 DIAGNOSIS — G4733 Obstructive sleep apnea (adult) (pediatric): Secondary | ICD-10-CM

## 2015-06-24 DIAGNOSIS — J189 Pneumonia, unspecified organism: Secondary | ICD-10-CM

## 2015-06-24 DIAGNOSIS — E039 Hypothyroidism, unspecified: Secondary | ICD-10-CM

## 2015-06-24 DIAGNOSIS — R7989 Other specified abnormal findings of blood chemistry: Secondary | ICD-10-CM

## 2015-06-24 DIAGNOSIS — E669 Obesity, unspecified: Secondary | ICD-10-CM

## 2015-06-24 DIAGNOSIS — E871 Hypo-osmolality and hyponatremia: Secondary | ICD-10-CM

## 2015-06-24 DIAGNOSIS — R0600 Dyspnea, unspecified: Secondary | ICD-10-CM

## 2015-06-24 DIAGNOSIS — R778 Other specified abnormalities of plasma proteins: Secondary | ICD-10-CM | POA: Diagnosis not present

## 2015-06-24 DIAGNOSIS — A09 Infectious gastroenteritis and colitis, unspecified: Secondary | ICD-10-CM

## 2015-06-24 DIAGNOSIS — N181 Chronic kidney disease, stage 1: Secondary | ICD-10-CM

## 2015-06-24 DIAGNOSIS — E785 Hyperlipidemia, unspecified: Secondary | ICD-10-CM

## 2015-06-24 DIAGNOSIS — N179 Acute kidney failure, unspecified: Secondary | ICD-10-CM

## 2015-06-24 LAB — COMPREHENSIVE METABOLIC PANEL
ALBUMIN: 2.3 g/dL — AB (ref 3.5–5.0)
ALT: 36 U/L (ref 14–54)
ANION GAP: 13 (ref 5–15)
AST: 22 U/L (ref 15–41)
Alkaline Phosphatase: 74 U/L (ref 38–126)
BILIRUBIN TOTAL: 0.3 mg/dL (ref 0.3–1.2)
BUN: 11 mg/dL (ref 6–20)
CO2: 20 mmol/L — ABNORMAL LOW (ref 22–32)
Calcium: 8.9 mg/dL (ref 8.9–10.3)
Chloride: 105 mmol/L (ref 101–111)
Creatinine, Ser: 1.1 mg/dL — ABNORMAL HIGH (ref 0.44–1.00)
GFR calc Af Amer: >60 mL/min (ref >60)
GFR calc non Af Amer: 54 mL/min — ABNORMAL LOW (ref >60)
GLUCOSE: 129 mg/dL — AB (ref 65–99)
POTASSIUM: 4 mmol/L (ref 3.5–5.1)
SODIUM: 138 mmol/L (ref 135–145)
TOTAL PROTEIN: 5.5 g/dL — AB (ref 6.5–8.1)

## 2015-06-24 LAB — CBC
HCT: 27.7 % — ABNORMAL LOW (ref 36.0–46.0)
HCT: 26.9 % — ABNORMAL LOW (ref 36.0–46.0)
HEMATOCRIT: 26 % — AB (ref 36.0–46.0)
HEMOGLOBIN: 8.4 g/dL — AB (ref 12.0–15.0)
Hemoglobin: 9.2 g/dL — ABNORMAL LOW (ref 12.0–15.0)
Hemoglobin: 8.8 g/dL — ABNORMAL LOW (ref 12.0–15.0)
MCH: 27.6 pg (ref 26.0–34.0)
MCH: 27.2 pg (ref 26.0–34.0)
MCH: 27.1 pg (ref 26.0–34.0)
MCHC: 33.2 g/dL (ref 30.0–36.0)
MCHC: 32.7 g/dL (ref 30.0–36.0)
MCHC: 32.3 g/dL (ref 30.0–36.0)
MCV: 83.2 fL (ref 78.0–100.0)
MCV: 83 fL (ref 78.0–100.0)
MCV: 83.9 fL (ref 78.0–100.0)
PLATELETS: 252 10*3/uL (ref 150–400)
Platelets: 240 10*3/uL (ref 150–400)
Platelets: 230 10*3/uL (ref 150–400)
RBC: 3.33 MIL/uL — ABNORMAL LOW (ref 3.87–5.11)
RBC: 3.24 MIL/uL — AB (ref 3.87–5.11)
RBC: 3.1 MIL/uL — ABNORMAL LOW (ref 3.87–5.11)
RDW: 18.6 % — AB (ref 11.5–15.5)
RDW: 18.5 % — ABNORMAL HIGH (ref 11.5–15.5)
RDW: 18.6 % — ABNORMAL HIGH (ref 11.5–15.5)
WBC: 25.3 10*3/uL — ABNORMAL HIGH (ref 4.0–10.5)
WBC: 22 10*3/uL — AB (ref 4.0–10.5)
WBC: 20.5 10*3/uL — ABNORMAL HIGH (ref 4.0–10.5)

## 2015-06-24 LAB — GLUCOSE, CAPILLARY
GLUCOSE-CAPILLARY: 131 mg/dL — AB (ref 65–99)
Glucose-Capillary: 121 mg/dL — ABNORMAL HIGH (ref 65–99)
Glucose-Capillary: 138 mg/dL — ABNORMAL HIGH (ref 65–99)
Glucose-Capillary: 109 mg/dL — ABNORMAL HIGH (ref 65–99)

## 2015-06-24 LAB — URINE CULTURE

## 2015-06-24 LAB — C DIFFICILE QUICK SCREEN W PCR REFLEX
C Diff antigen: NEGATIVE
C Diff interpretation: NEGATIVE
C Diff toxin: NEGATIVE

## 2015-06-24 LAB — TROPONIN I
TROPONIN I: 0.73 ng/mL — AB (ref <0.031)
Troponin I: 0.3 ng/mL — ABNORMAL HIGH (ref <0.031)
Troponin I: 0.05 ng/mL — ABNORMAL HIGH (ref <0.031)

## 2015-06-24 LAB — PROTIME-INR
INR: 1.2 (ref 0.00–1.49)
Prothrombin Time: 15.4 seconds — ABNORMAL HIGH (ref 11.6–15.2)

## 2015-06-24 LAB — ECHOCARDIOGRAM COMPLETE
Height: 65 in
Weight: 5040 oz

## 2015-06-24 LAB — HIV ANTIBODY (ROUTINE TESTING W REFLEX): HIV SCREEN 4TH GENERATION: NONREACTIVE

## 2015-06-24 MED ORDER — DEXTROSE 5 % IV SOLN
1.0000 g | INTRAVENOUS | Status: DC
  Administered 2015-06-24 – 2015-06-26 (×3): 1 g via INTRAVENOUS
  Filled 2015-06-24 (×5): qty 10

## 2015-06-24 MED ORDER — SODIUM CHLORIDE 0.9 % IV SOLN
INTRAVENOUS | Status: DC
  Administered 2015-06-24: 19:00:00 via INTRAVENOUS

## 2015-06-24 NOTE — Progress Notes (Signed)
Pharmacy Antibiotic Note  [name] is a 60 y.o. female admitted on 06/23/2015 with CAP.  Pharmacy has been consulted for Rocephin and Azithromycin dosing.  Pt presented with c/o bloody, watery diarrhea and crampy abdominal pain.  Lactic acid was elevated, now resolved, WBC remain elevated, pt is afebrile. CXR with infiltrates of right lung.  Plan: Decrease Rocephin to 1 g IV q24 for CAP Azithromycin 575m IV q24 Monitor cultures, LOT, clinical progression   Height: _0  (165.1 cm) Weight: (!) 315 lb (142.883 kg) IBW/kg (Calculated) : 57  Temp (24hrs), Avg:98.4 F (36.9 C), Min:97.9 F (36.6 C), Max:99.2 F (37.3 C)   Recent Labs Lab 06/23/15 0754 06/23/15 0836 06/23/15 1140 06/23/15 1333 06/23/15 1335 06/23/15 1545 06/23/15 2118 06/24/15 0313 06/24/15 0758  WBC 31.5*  --   --   --   --  25.6* 30.5* 25.3* 22.0*  CREATININE 1.55*  --   --  1.28*  --   --   --  1.10*  --   LATICACIDVEN  --  4.65* 2.30*  --  2.0 1.1  --   --   --     Estimated Creatinine Clearance: 79.5 mL/min (by C-G formula based on Cr of 1.1).    Allergies  Allergen Reactions  . Bee Venom Anaphylaxis  . Shellfish Allergy Anaphylaxis  . Buprenorphine Hcl Other (See Comments)    Overly sedated  . Morphine And Related Other (See Comments)    Overly sedated      Thank you for allowing pharmacy to be a part of this patient's care.  CJoya San PharmD Clinical Pharmacy Resident Pager # [phone] 63695/08/2015 3:28 PM

## 2015-06-24 NOTE — Progress Notes (Signed)
Cdiff PCR negative. RN received call from lab in reguards to GI panel, amount of stool insufficient to run test. Spoke with MD, he would like to keep her on enteric precautions until tomorrow and to d/c if she does not have a BM tonight/tomorrow. Will report to next shift.

## 2015-06-24 NOTE — Evaluation (Signed)
Physical Therapy Evaluation Patient Details Name: [name] MRN: [id number] DOB: 1955-12-14 Today's Date: 06/24/2015   History of Present Illness  [name] is a 60 y.o. female with a Past Medical History of DM2 insulin dependent, left breast cancer not currently on treatment, htn, OSA on home cpap, ckd stage 3, schizophrenia, who presents w/ numerous complaints including bloody diarrhea x 2 days (currently resolved), doe and substernal chest pain w/o radiation.Presumed Sepsis/SIRS, likely related to CAP and in the setting of Bloody diarrhea  Clinical Impression   Pt admitted with above diagnosis. Pt currently with functional limitations due to the deficits listed below (see PT Problem List).  Pt will benefit from skilled PT to increase their independence and safety with mobility to allow discharge to the venue listed below.       Follow Up Recommendations Home health PT;Supervision/Assistance - 24 hour    Equipment Recommendations  Rolling walker with 5" wheels;3in1 (PT)    Recommendations for Other Services OT consult     Precautions / Restrictions Precautions Precautions: Fall      Mobility  Bed Mobility Overal bed mobility: Needs Assistance Bed Mobility: Supine to Sit     Supine to sit: Min assist;Mod assist     General bed mobility comments: Performed semi-log roll towards EOB; min assist and use of bed pad to scoot hips toward EOB; mod assist to elelvate trunk to midline sitting; grimace with pain  Transfers Overall transfer level: Needs assistance Equipment used: Rolling walker (2 wheeled) Transfers: Sit to/from Stand Sit to Stand: +2 safety/equipment;Mod assist         General transfer comment: light mod assist to acheive standing  Ambulation/Gait Ambulation/Gait assistance: Min assist;+2 safety/equipment Ambulation Distance (Feet): 15 Feet Assistive device: Rolling walker (2 wheeled) Gait Pattern/deviations: Step-through pattern;Decreased  stride length;Trunk flexed Gait velocity: quite slow   General Gait Details: painful with standing and walking; trunk flexed over RW; min assist to keep RW close  Stairs            Wheelchair Mobility    Modified Rankin (Stroke Patients Only)       Balance Overall balance assessment: Needs assistance;History of Falls           Standing balance-Leahy Scale: Poor                               Pertinent Vitals/Pain Pain Assessment: 0-10 Pain Score: 10-Worst pain ever Pain Location: back pain -- chronic Pain Descriptors / Indicators: Aching Pain Intervention(s): Limited activity within patient's tolerance;Monitored during session;Repositioned;Other (comment) (Pt told me she had received pain meds pretty recently)    [place] expects to be discharged to:: Private residence Living Arrangements: Alone Available Help at Discharge: Family;Available PRN/intermittently;Personal care attendant Type of Home: House Home Access: Stairs to enter Entrance Stairs-Rails: None Entrance Stairs-Number of Steps: 1 Home Layout: One level Home Equipment: Cane - single point;Walker - 2 wheels;Shower seat Additional Comments: States her children assist her with groceries, shopping    Prior Function Level of Independence: Independent with assistive device(s)         Comments: Use of RW for minimal ambulation - reports walks to and from bathroom, able to make simple meals and sit at dining room chair for 10-20 mins before needing to lay back down.     Hand Dominance   Dominant Hand: Right    Extremity/Trunk Assessment   Upper Extremity Assessment: Generalized  weakness           Lower Extremity Assessment: Generalized weakness         Communication   Communication: No difficulties  Cognition Arousal/Alertness: Awake/alert Behavior During Therapy: WFL for tasks assessed/performed;Flat affect Overall Cognitive Status: Within Functional  Limits for tasks assessed                      General Comments General comments (skin integrity, edema, etc.): VSS    Exercises        Assessment/Plan    PT Assessment Patient needs continued PT services  PT Diagnosis Difficulty walking;Generalized weakness;Acute pain   PT Problem List Decreased strength;Decreased range of motion;Decreased activity tolerance;Decreased balance;Decreased mobility;Decreased coordination;Decreased cognition;Decreased knowledge of use of DME;Pain;Decreased knowledge of precautions  PT Treatment Interventions DME instruction;Gait training;Stair training;Functional mobility training;Therapeutic activities;Therapeutic exercise;Patient/family education   PT Goals (Current goals can be found in the Care Plan section) Acute Rehab PT Goals Patient Stated Goal: feel better PT Goal Formulation: With patient Time For Goal Achievement: 07/08/15 Potential to Achieve Goals: Good    Frequency Min 3X/week   Barriers to discharge        Co-evaluation               End of Session Equipment Utilized During Treatment: Gait belt Activity Tolerance: Patient limited by pain Patient left: in chair;with call bell/phone within reach Nurse Communication: Mobility status         Time: 1000-1017 PT Time Calculation (min) (ACUTE ONLY): 17 min   Charges:   PT Evaluation $PT Eval Moderate Complexity: 1 Procedure     PT G CodesRoney Perez Perez 06/24/2015, 11:04 AM  [name], PT  Acute Rehabilitation Services Pager [phone] Office [phone]

## 2015-06-24 NOTE — Progress Notes (Addendum)
PROGRESS NOTE  [name]  WUX:[id number] DOB: 1955/12/04  DOA: 06/23/2015 PCP: [name], MD/ Minette Brine FNP-BC at [facility]  Outpatient Specialists:  Cardiology: Dr. Grayland Jack Pain management: Dr. Andree Elk at preferred pain management Psychiatry: Isa Rankin  Brief Narrative:  60 year old female, single, lives alone, ambulates with the help of a walker, extensive PMH: Depression/anxiety/schizophrenia, hypothyroid, GERD, chronic low back pain, morbid obesity, HLD, HTN, DM 2 with neuropathy, OSA on nightly CPAP, stage II chronic kidney disease and breast cancer presented to [facility] ED on 06/23/15 with complaints of dry cough, chest pain on coughing, DOE, 2 day history of diarrhea with blood. She denied fevers, nausea or vomiting. Give history of a syncopal episode on Thursday due to low blood sugars after taking insulin but did not seek medical attention for this. Admitted for suspected community-acquired pneumonia, diarrhea with blood in stools, atypical chest pain. Cardiology consulted for elevated troponin.  Assessment & Plan:   Principal Problem:   Community acquired pneumonia Active Problems:   Hypothyroidism   HLD (hyperlipidemia)   OBESITY   Depression   Essential hypertension   Asthma   GERD   Breast cancer of upper-outer quadrant of right female breast ([place])   Secondary parkinsonism ([place])   Type II diabetes mellitus ([place])   OSA on CPAP   Chronic lower back pain   CKD (chronic kidney disease), stage III   Diarrhea   Sepsis (HCC)   Elevated troponin   SIRS/presumed Sepsis secondary to community-acquired pneumonia and acute GE - Treated with IV fluids and antibiotics. - Sepsis physiology resolved.  Community-acquired pneumonia - Repeat chest x-ray confirms small area of infiltrate in the right mid lung, likely pneumonia. - CT abdomen and pelvis: Groundglass and consolidative opacities within the lower lobes bilaterally may represent  pneumonia. - Continue empiric IV Rocephin and azithromycin. - Recommend repeating chest x-ray in 3-4 weeks to ensure resolution of pneumonia findings.  Bloody diarrhea - ? Secondary to acute viral gastroenteritis. C. difficile PCR negative. - Monitor clinically. Seems to have decreased. If worsens or persists, consider GI consultation ([facility]) - Last colonoscopy 2014 by Dr. Michail Sermon remarkable for internal hemorrhoids-may also be the cause. - Follow GI pathogen panel PCR. Received a dose of Flagyl in ED, not continued at this time. - CT abdomen without acute findings. - DC subcutaneous heparin being used for DVT prophylaxis and use SCDs.  Atypical chest pain/elevated troponin - Chest pain is atypical features and some reproducible. May be related to pneumonia/pleuritic. Troponin 0.73. EKG without acute changes. - Cardiology consulted: Trend troponin. No ischemic workup planned at this time. - Nuclear stress test in 2015 was negative. Remains on aspirin.  Chronic diastolic CHF - Compensated. Repeat 2-D echo.  Hyperlipidemia - Statins.  Uncontrolled DM 2 with neuropathy - Hemoglobin A1c: 7.5 on 10/28/14. Follow repeat A1c. Hold oral hypoglycemics. NovoLog SSI.  Essential hypertension - Controlled. Continue ARB, HCTZ.  Leukocytosis - Possibly stress margination. Follow CBCs.  Anxiety/depression/schizophrenia - Stable without suicidal or homicidal ideations or audiovisual hallucinations. Continue home medications.  Hypothyroid - Synthroid. TSH normal.  Chronic pain - Follows with pain management as outpatient.   Hyponatremia - May have been related to intravascular volume depletion. Resolved.  Acute kidney injury - Creatinine was normal at 1.1 on 03/26/15. Presented with creatinine of 1.5. Creatinine has normalized post hydration.  Anemia - Baseline hemoglobin probably in the 10 g per DL range. Hemoglobin has dropped to 8.8. Follow CBCs closely and transfuse if  hemoglobin  <7 g per DL. If continues to have bloody diarrhea or worsening anemia, GI consultation.  Morbid obesity/Body mass index is 52.42 kg/(m^2).  OSA on nightly CPAP - Continue CPAP.   Elevated lactate/lactic acidosis - 4.65 on initial arrival. Resolved.     DVT prophylaxis: SCD's Code Status: Full Family Communication: None at bedside. Discussed with patient. Disposition Plan: Remains in stepdown unit for additional 24 hours. DC home when medically stable.   Consultants:   Cardiology   Procedures:   None  Antimicrobials:   Rocephin 5/6 >  Azithromycin 5/6 >    Subjective: Feels better. Chest pain improved and only on coughing. No dyspnea reported. Minimal dry cough. 2 small BMs this morning-states that stools are more formed and minimal blood. Denies abdominal pain. Chronic low back pain. As per RN, no acute issues.  Objective:  Filed Vitals:   06/24/15 0401 06/24/15 0818 06/24/15 1059 06/24/15 1132  BP: 136/72 104/55  100/59  Pulse: 95 93 88 92  Temp: 98.4 F (36.9 C) 98.2 F (36.8 C)  98.1 F (36.7 C)  TempSrc: Oral Oral  Oral  Resp: _0 Height:      Weight:      SpO2: 97% 97% 99% 98%    Intake/Output Summary (Last 24 hours) at 06/24/15 1243 Last data filed at 06/24/15 1058  Gross per 24 hour  Intake   2855 ml  Output   4100 ml  Net  -1245 ml   Filed Weights   06/23/15 1152 06/23/15 1624  Weight: 142.883 kg (315 lb) 142.883 kg (315 lb)    Examination:  General exam: Pleasant middle-aged female, moderately built and morbidly obese, lying comfortably supine in bed. Appears calm and comfortable  Respiratory system: Clear to auscultation. Respiratory effort normal. Reproducible midsternal chest pain. Cardiovascular system: S1 & S2 heard, RRR.[name] No JVD, murmurs, rubs, gallops or clicks. No pedal edema. Telemetry: Sinus rhythm-mild sinus tachycardia in the 100s. Gastrointestinal system: Abdomen is nondistended, soft and nontender. No organomegaly  or masses felt. Normal bowel sounds heard. Central nervous system: Alert and oriented. No focal neurological deficits. Extremities: Symmetric 5 x 5 power. Skin: No rashes, lesions or ulcers Psychiatry: Judgement and insight appear normal. Mood & affect appropriate.     Data Reviewed: I have personally reviewed following labs and imaging studies  CBC:  Recent Labs Lab 06/23/15 0754 06/23/15 1545 06/23/15 2118 06/24/15 0313 06/24/15 0758  WBC 31.5* 25.6* 30.5* 25.3* 22.0*  HGB 10.5* 9.2* 10.1* 9.2* 8.8*  HCT 31.2* 27.2* 30.1* 27.7* 26.9*  MCV 83.6 81.7 84.8 83.2 83.0  PLT 259 234 254 240 409   Basic Metabolic Panel:  Recent Labs Lab 06/23/15 0754 06/23/15 1333 06/24/15 0313  NA 128* 131* 138  K 4.3 3.9 4.0  CL 92* 96* 105  CO2 20* 21* 20*  GLUCOSE 132* 147* 129*  BUN 27* 19 11  CREATININE 1.55* 1.28* 1.10*  CALCIUM 9.1 8.7* 8.9   GFR: Estimated Creatinine Clearance: 79.5 mL/min (by C-G formula based on Cr of 1.1). Liver Function Tests:  Recent Labs Lab 06/23/15 0754 06/23/15 1333 06/24/15 0313  AST 39 31 22  ALT 50 45 36  ALKPHOS 79 74 74  BILITOT 0.6 0.5 0.3  PROT 6.1* 5.4* 5.5*  ALBUMIN 2.5* 2.3* 2.3*    Recent Labs Lab 06/23/15 0754  LIPASE 20   No results for input(s): AMMONIA in the last 168 hours. Coagulation Profile:  Recent Labs Lab 06/23/15 1333 06/24/15  0923  INR 1.46 1.20   Cardiac Enzymes:  Recent Labs Lab 06/24/15 0758  TROPONINI 0.73*   BNP (last 3 results) No results for input(s): PROBNP in the last 8760 hours. HbA1C: No results for input(s): HGBA1C in the last 72 hours. CBG:  Recent Labs Lab 06/23/15 1521 06/23/15 1644 06/23/15 2156 06/24/15 0837  GLUCAP 112* 106* 110* 131*   Lipid Profile: No results for input(s): CHOL, HDL, LDLCALC, TRIG, CHOLHDL, LDLDIRECT in the last 72 hours. Thyroid Function Tests:  Recent Labs  06/23/15 1334  TSH 2.185   Anemia Panel: No results for input(s): VITAMINB12,  FOLATE, FERRITIN, TIBC, IRON, RETICCTPCT in the last 72 hours.  Recent Results (from the past 240 hour(s))  C difficile quick scan w PCR reflex     Status: None   Collection Time: 06/23/15 11:51 PM  Result Value Ref Range Status   C Diff antigen NEGATIVE NEGATIVE Final   C Diff toxin NEGATIVE NEGATIVE Final   C Diff interpretation Negative for toxigenic C. difficile  Final         Radiology Studies: Dg Chest 1 View  06/23/2015  CLINICAL DATA:  Patient status post fall in the bathroom. Lower back pain. Code sepsis. EXAM: CHEST 1 VIEW COMPARISON:  Chest radiograph 11/12/2014 FINDINGS: Surgical clips within the cervical soft tissue. Stable enlarged cardiac and mediastinal contours. Right mid lung heterogeneous opacities. Low lung volumes. Left basilar atelectasis. No pleural effusion or pneumothorax. IMPRESSION: Focal consolidation within the right mid lung concerning for pneumonia. Followup PA and lateral chest X-ray is recommended in 3-4 weeks following trial of antibiotic therapy to ensure resolution and exclude underlying malignancy. Electronically Signed   By: Lovey Newcomer M.D.   On: 06/23/2015 09:26   X-ray Chest Pa And Lateral  06/24/2015  CLINICAL DATA:  Sepsis.  History of asthma EXAM: CHEST  2 VIEW COMPARISON:  Jun 23, 2015 FINDINGS: There is a small area of infiltrate in the right mid lung region. Lungs elsewhere clear. Heart size and pulmonary vascularity are normal. No adenopathy. There are surgical clips in the right cervical -thoracic junction region. IMPRESSION: Small area of infiltrate in the right mid lung region, likely pneumonia. Lungs elsewhere clear. Stable cardiac silhouette. Followup PA and lateral chest radiographs recommended in 3-4 weeks following trial of antibiotic therapy to ensure resolution and exclude underlying malignancy. Electronically Signed   By: Lowella Grip III M.D.   On: 06/24/2015 08:48   Dg Lumbar Spine Complete  06/23/2015  CLINICAL DATA:  Lumbago EXAM:  LUMBAR SPINE - COMPLETE 4+ VIEW COMPARISON:  Lumbar spine series August 14, 2014 and lumbar MRI August 14, 2014 FINDINGS: Frontal, lateral, spot lumbosacral lateral, and bilateral oblique views were obtained. There are 5 non-rib-bearing lumbar type vertebral bodies. There is no fracture or spondylolisthesis. There is mild disc space narrowing at L3-4, L4-5, and L5-S1. There is facet osteoarthritic change at L5-S1 bilaterally. IMPRESSION: Areas of osteoarthritic change, essentially stable. No fracture or spondylolisthesis. Electronically Signed   By: Lowella Grip III M.D.   On: 06/23/2015 09:26   Ct Abdomen Pelvis W Contrast  06/23/2015  CLINICAL DATA:  Patient with mid abdominal pain, nausea vomiting and diarrhea. EXAM: CT ABDOMEN AND PELVIS WITH CONTRAST TECHNIQUE: Multidetector CT imaging of the abdomen and pelvis was performed using the standard protocol following bolus administration of intravenous contrast. CONTRAST:  75cc ISOVUE-300 IOPAMIDOL (ISOVUE-300) INJECTION 61% COMPARISON:  None. FINDINGS: Lower chest: There are ground-glass and consolidative opacities within the lower lobes bilaterally. Heart  is enlarged. Small hiatal hernia. Hepatobiliary: Liver is normal in size and contour. No focal lesion is identified. Patient status post cholecystectomy. Pancreas: Fatty atrophy of the pancreatic parenchyma. Spleen: Unremarkable Adrenals/Urinary Tract: Normal adrenal glands. Kidneys enhance symmetrically with contrast. No hydronephrosis. Urinary bladder is unremarkable. Stomach/Bowel: No abnormal bowel wall thickening or evidence for bowel obstruction. No free fluid or free intraperitoneal air. Vascular/Lymphatic: Normal caliber abdominal aorta. No retroperitoneal lymphadenopathy. Other: Uterus is surgically absent. Adnexal structures are unremarkable. Musculoskeletal: Lumbar spine degenerative changes. No aggressive or acute appearing osseous lesions. IMPRESSION: Ground-glass and consolidative opacities  within the lower lobes bilaterally may represent pneumonia in the appropriate clinical setting. Recommend radiographic follow-up in 3- 4 weeks to ensure resolution. No acute process within the abdomen or pelvis. Electronically Signed   By: Lovey Newcomer M.D.   On: 06/23/2015 10:14        Scheduled Meds: . azithromycin  500 mg Intravenous Q24H  . cefTRIAXone (ROCEPHIN)  IV  2 g Intravenous Q24H  . gabapentin  300 mg Oral TID  . gi cocktail  30 mL Oral Once  . heparin  5,000 Units Subcutaneous Q8H  . irbesartan  150 mg Oral Daily   And  . hydrochlorothiazide  12.5 mg Oral Daily  . insulin aspart  0-9 Units Subcutaneous TID WC  . levothyroxine  175 mcg Oral QAC breakfast  . LORazepam  1-2 mg Oral BID  . lubiprostone  8 mcg Oral BID WC  . pantoprazole  40 mg Oral Daily  . rosuvastatin  20 mg Oral Daily  . sodium chloride flush  3 mL Intravenous Q12H  . tapentadol  125 mg Oral Q6H  . valACYclovir  1,000 mg Oral BID  . venlafaxine XR  150 mg Oral BID  . ziprasidone  40 mg Oral QPC supper   Continuous Infusions: . sodium chloride 100 mL/hr at 06/24/15 0600     LOS: 1 day    Time spent: 45 minutes.    [facility], MD [facility] Pager [phone]  If 7PM-7AM, please contact night-coverage [email or web address] Password [email or web address] 06/24/2015, 12:43 PM

## 2015-06-24 NOTE — Progress Notes (Signed)
  Echocardiogram 2D Echocardiogram has been performed.  [name] 06/24/2015, 4:19 PM

## 2015-06-24 NOTE — Consult Note (Signed)
CARDIOLOGY CONSULT NOTE       Patient ID: [name] MRN: [id number] DOB/AGE: August 29, 1955 60 y.o.  Admit date: 06/23/2015 Referring Physician:  Algis Perez Primary Physician: Tammie Greenland, MD Primary Cardiologist:  [name] Reason for Consultation: Elevated Troponin  Principal Problem:   Community acquired pneumonia Active Problems:   Hypothyroidism   HLD (hyperlipidemia)   OBESITY   Depression   Essential hypertension   Asthma   GERD   Hx of radiation therapy   Breast cancer of upper-outer quadrant of right female breast ([name])   Secondary parkinsonism ([name])   Benign paroxysmal positional vertigo   Type II diabetes mellitus ([name])   OSA on CPAP   Chronic lower back pain   Acute encephalopathy   CKD (chronic kidney disease), stage III   Diarrhea   Sepsis ([name])   HPI:  60 y.o. with bipolar disease , depression, schizophrenia.  Admitted with abdominal pain , bloody stool, dyspnea with CXR showing RML pneumonia.  CT abdomen with no acute process. She lives alone in apartment. Children help her and uses [name] for transportation. She has been sick for 4 days with vomiting , nausea, poor po intake.  No one around her is sick. After vomiting and abdominal complained of vague pain in chest and troponin found to be minimally elevated  NOte she had similar presentation in July 2015 when she last saw Tammie [name] and [name] was non ischemic and low risk with breast artifact.  Currently not having any chest pain. She has significant anemia and elevation in WBC Lactate 4.65 on admission and now normal   Lab Results  Component Value Date   CKTOTAL 175 06/04/2011   CKMB 1.9 06/04/2011   TROPONINI 0.73* 06/24/2015     ROS All other systems reviewed and negative except as noted above  Past Medical History  Diagnosis Date  . Depression   . Thyroid disease   . GERD (gastroesophageal reflux disease)   . Pancreatitis     hx of  . Knee pain, bilateral   . Obesity   . HSV (herpes  simplex virus) infection   . Hyperlipemia   . Asthma   . Tachycardia     with sx in 2008  . Breast mass in female     L breast 2008, US showed likely fat necrosis  . Anxiety   . Hypertension     sees Tammie. Criss Perez , [name]  . Hx of radiation therapy 05/05/12- 07/15/12    right breast, 45 gray x 25 fx, lumpectomy cavity boosted to 16.2 gray  . History of blood transfusion 2011    "after one of my OR's"  . Hypothyroidism   . Secondary parkinsonism ([name]) 12/30/2012  . Polyneuropathy in diabetes(357.2)   . Benign paroxysmal positional vertigo 12/30/2012  . Breast cancer ([place]) 02/13/12    ruq  100'clock bx Ductal Carcinoma in Situ,(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  . OSA on CPAP     pt does not know settings  . Pneumonia 1950's  . Anemia   . Type II diabetes mellitus ([name])   . History of hiatal hernia   . History of stomach ulcers   . Daily headache   . Migraines     "~ 3 times/month" (01/06/2014)  . Arthritis     "pretty much all my joints"  . Chronic lower back pain   . Schizophrenia ([name])   . Kidney stones   . CKD (chronic kidney disease), stage III     "lower  stage" (01/06/2014)    Family History  Problem Relation Age of Onset  . Heart disease Brother     Multiple MIs, starting in his 74s  . Diabetes Brother   . Hypertension Mother   . Diabetes Mother   . Breast cancer Mother 72  . Bone cancer Mother   . Hypertension Sister   . Diabetes Sister   . Breast cancer Sister 44  . Breast cancer Maternal Grandmother   . Heart disease Maternal Grandmother   . Uterine cancer Other 19  . Breast cancer Paternal Aunt 48  . Breast cancer Paternal Grandmother     dx in her 56s  . Prostate cancer Paternal Grandfather     Social History   Social History  . Marital Status: Divorced    Spouse Name: N/A  . Number of Children: 2  . Years of Education: 13   Occupational History  . Disabled    Social History Main Topics  . Smoking status: Former Smoker -- 1.00 packs/day for 7 years     Types: Cigarettes    Quit date: 03/24/1992  . Smokeless tobacco: Never Used  . Alcohol Use: No     Comment: "stopped drinking in 1996; I was an alcoholic"  . Drug Use: No  . Sexual Activity: No   Other Topics Concern  . Not on file   Social History Narrative   Single. Disabled Biochemist, clinical.       Patient does not drink caffeine.   Patient is right handed.     Past Surgical History  Procedure Laterality Date  . Total knee arthroplasty Bilateral 2009-06/2009    left; right  . Foot fracture surgery Right 1990's  . Shoulder open rotator cuff repair Left 1990's  . Thyroidectomy  1970's  . Joint replacement    . Abdominal hysterectomy  1979?    partial  . Cholecystectomy  1980's?  . Colonoscopy with propofol N/A 09/28/2012    Procedure: COLONOSCOPY WITH PROPOFOL;  Surgeon: Lear Ng, MD;  Location: [facility];  Service: Endoscopy;  Laterality: N/A;  . Revision total knee arthroplasty  07/2009  . Breast surgery Right 01/2012    "cancer"  . Breast lumpectomy  03/03/2012    Procedure: LUMPECTOMY;  Surgeon: Haywood Lasso, MD;  Location: [facility];  Service: General;  Laterality: Right;     . azithromycin  500 mg Intravenous Q24H  . cefTRIAXone (ROCEPHIN)  IV  2 g Intravenous Q24H  . gabapentin  300 mg Oral TID  . gi cocktail  30 mL Oral Once  . heparin  5,000 Units Subcutaneous Q8H  . irbesartan  150 mg Oral Daily   And  . hydrochlorothiazide  12.5 mg Oral Daily  . insulin aspart  0-9 Units Subcutaneous TID WC  . levothyroxine  175 mcg Oral QAC breakfast  . LORazepam  1-2 mg Oral BID  . lubiprostone  8 mcg Oral BID WC  . pantoprazole  40 mg Oral Daily  . rosuvastatin  20 mg Oral Daily  . sodium chloride flush  3 mL Intravenous Q12H  . tapentadol  125 mg Oral Q6H  . valACYclovir  1,000 mg Oral BID  . venlafaxine XR  150 mg Oral BID  . ziprasidone  40 mg Oral QPC supper   . sodium chloride 100 mL/hr at 06/24/15 0600    Physical Exam: Blood pressure  104/55, pulse 88, temperature 98.2 F (36.8 C), temperature source Oral, resp. rate 14, height _0  (1.651 m),  weight 142.883 kg (315 lb), SpO2 99 %.    Affect appropriate Obese black female  HEENT: normal Neck supple with no adenopathy JVP normal no bruits no thyromegaly Lungs clear with no wheezing and good diaphragmatic motion Heart:  S1/S2 no murmur, no rub, gallop or click PMI normal Abdomen: benighn, BS positve, no tenderness, no AAA no bruit.  No HSM or HJR Distal pulses intact with no bruits No edema Neuro non-focal Skin warm and dry No muscular weakness   Labs:   Lab Results  Component Value Date   WBC 22.0* 06/24/2015   HGB 8.8* 06/24/2015   HCT 26.9* 06/24/2015   MCV 83.0 06/24/2015   PLT 252 06/24/2015    Recent Labs Lab 06/24/15 0313  NA 138  K 4.0  CL 105  CO2 20*  BUN 11  CREATININE 1.10*  CALCIUM 8.9  PROT 5.5*  BILITOT 0.3  ALKPHOS 74  ALT 36  AST 22  GLUCOSE 129*   Lab Results  Component Value Date   CKTOTAL 175 06/04/2011   CKMB 1.9 06/04/2011   TROPONINI 0.73* 06/24/2015    Lab Results  Component Value Date   CHOL 147 03/23/2009   CHOL 161 11/09/2008   CHOL 204* 02/07/2008   Lab Results  Component Value Date   HDL 59 03/23/2009   HDL 63 11/09/2008   HDL 63 02/07/2008   Lab Results  Component Value Date   LDLCALC 58 03/23/2009   LDLCALC 79 11/09/2008   LDLCALC 114* 02/07/2008   Lab Results  Component Value Date   TRIG 149 03/23/2009   TRIG 96 11/09/2008   TRIG 137 02/07/2008   Lab Results  Component Value Date   CHOLHDL 2.5 Ratio 03/23/2009   CHOLHDL 2.6 Ratio 11/09/2008   CHOLHDL 3.2 Ratio 02/07/2008   No results found for: LDLDIRECT    Radiology: Dg Chest 1 View  06/23/2015  CLINICAL DATA:  Patient status post fall in the bathroom. Lower back pain. Code sepsis. EXAM: CHEST 1 VIEW COMPARISON:  Chest radiograph 11/12/2014 FINDINGS: Surgical clips within the cervical soft tissue. Stable enlarged cardiac and  mediastinal contours. Right mid lung heterogeneous opacities. Low lung volumes. Left basilar atelectasis. No pleural effusion or pneumothorax. IMPRESSION: Focal consolidation within the right mid lung concerning for pneumonia. Followup PA and lateral chest X-ray is recommended in 3-4 weeks following trial of antibiotic therapy to ensure resolution and exclude underlying malignancy. Electronically Signed   By: Lovey Newcomer M.D.   On: 06/23/2015 09:26   X-ray Chest Pa And Lateral  06/24/2015  CLINICAL DATA:  Sepsis.  History of asthma EXAM: CHEST  2 VIEW COMPARISON:  Jun 23, 2015 FINDINGS: There is a small area of infiltrate in the right mid lung region. Lungs elsewhere clear. Heart size and pulmonary vascularity are normal. No adenopathy. There are surgical clips in the right cervical -thoracic junction region. IMPRESSION: Small area of infiltrate in the right mid lung region, likely pneumonia. Lungs elsewhere clear. Stable cardiac silhouette. Followup PA and lateral chest radiographs recommended in 3-4 weeks following trial of antibiotic therapy to ensure resolution and exclude underlying malignancy. Electronically Signed   By: Lowella Grip III M.D.   On: 06/24/2015 08:48   Dg Lumbar Spine Complete  06/23/2015  CLINICAL DATA:  Lumbago EXAM: LUMBAR SPINE - COMPLETE 4+ VIEW COMPARISON:  Lumbar spine series August 14, 2014 and lumbar MRI August 14, 2014 FINDINGS: Frontal, lateral, spot lumbosacral lateral, and bilateral oblique views were obtained. There are 5 non-rib-bearing  lumbar type vertebral bodies. There is no fracture or spondylolisthesis. There is mild disc space narrowing at L3-4, L4-5, and L5-S1. There is facet osteoarthritic change at L5-S1 bilaterally. IMPRESSION: Areas of osteoarthritic change, essentially stable. No fracture or spondylolisthesis. Electronically Signed   By: Lowella Grip III M.D.   On: 06/23/2015 09:26   Ct Abdomen Pelvis W Contrast  06/23/2015  CLINICAL DATA:  Patient with mid  abdominal pain, nausea vomiting and diarrhea. EXAM: CT ABDOMEN AND PELVIS WITH CONTRAST TECHNIQUE: Multidetector CT imaging of the abdomen and pelvis was performed using the standard protocol following bolus administration of intravenous contrast. CONTRAST:  75cc ISOVUE-300 IOPAMIDOL (ISOVUE-300) INJECTION 61% COMPARISON:  None. FINDINGS: Lower chest: There are ground-glass and consolidative opacities within the lower lobes bilaterally. Heart is enlarged. Small hiatal hernia. Hepatobiliary: Liver is normal in size and contour. No focal lesion is identified. Patient status post cholecystectomy. Pancreas: Fatty atrophy of the pancreatic parenchyma. Spleen: Unremarkable Adrenals/Urinary Tract: Normal adrenal glands. Kidneys enhance symmetrically with contrast. No hydronephrosis. Urinary bladder is unremarkable. Stomach/Bowel: No abnormal bowel wall thickening or evidence for bowel obstruction. No free fluid or free intraperitoneal air. Vascular/Lymphatic: Normal caliber abdominal aorta. No retroperitoneal lymphadenopathy. Other: Uterus is surgically absent. Adnexal structures are unremarkable. Musculoskeletal: Lumbar spine degenerative changes. No aggressive or acute appearing osseous lesions. IMPRESSION: Ground-glass and consolidative opacities within the lower lobes bilaterally may represent pneumonia in the appropriate clinical setting. Recommend radiographic follow-up in 3- 4 weeks to ensure resolution. No acute process within the abdomen or pelvis. Electronically Signed   By: Lovey Newcomer M.D.   On: 06/23/2015 10:14    EKG: ST rate 107 low voltage from body habitus   ASSESSMENT AND PLAN:  Elevated Troponin: non specific no real chest pain no acute ECG changes would observe , serial enzymes no indication for invasive evaluation or repeat myovue Abdominal Pain:  CT no acute process but anemic exam benign now guaic stools  Pneumonia:  RML WBC elevated on azitrhomycin and ceftriaxone f/u CXR in 3 days    Thyroid : on replacement  Lab Results  Component Value Date   TSH 2.185 06/23/2015   DM:  Discussed low carb diet.  Target hemoglobin A1c is 6.5 or less.  Continue current medications. HTN:  Well controlled.  Continue current medications and low sodium Dash type diet.     SignedJenkins Rouge 06/24/2015, 11:13 AM

## 2015-06-24 NOTE — Progress Notes (Signed)
CRITICAL VALUE ALERT  Critical value received:  0.73 troponin  Date of notification:  06/24/2015   Time of notification:  4142  Critical value read back:Yes.    Nurse who received alert:  Cherly Beach RN  MD notified (1st page):  [place]  Time of first page:  60  MD notified (2nd page):  Time of second page:  Responding MD:  Lindaann Pascal  Time MD responded:  1030  Pt denies chest pain, vitals stable at this time, MD aware and will consult cardiology MD at this time. Will continue to monitor.

## 2015-06-25 DIAGNOSIS — I10 Essential (primary) hypertension: Secondary | ICD-10-CM

## 2015-06-25 LAB — HEMOGLOBIN A1C
HEMOGLOBIN A1C: 7.3 % — AB (ref 4.8–5.6)
MEAN PLASMA GLUCOSE: 163 mg/dL

## 2015-06-25 LAB — CBC
HCT: 28.3 % — ABNORMAL LOW (ref 36.0–46.0)
HEMOGLOBIN: 9.2 g/dL — AB (ref 12.0–15.0)
MCH: 26.7 pg (ref 26.0–34.0)
MCHC: 32.5 g/dL (ref 30.0–36.0)
MCV: 82.3 fL (ref 78.0–100.0)
Platelets: 243 10*3/uL (ref 150–400)
RBC: 3.44 MIL/uL — AB (ref 3.87–5.11)
RDW: 18.6 % — ABNORMAL HIGH (ref 11.5–15.5)
WBC: 18.9 10*3/uL — ABNORMAL HIGH (ref 4.0–10.5)

## 2015-06-25 LAB — BASIC METABOLIC PANEL
Anion gap: 12 (ref 5–15)
BUN: 12 mg/dL (ref 6–20)
CHLORIDE: 106 mmol/L (ref 101–111)
CO2: 19 mmol/L — ABNORMAL LOW (ref 22–32)
CREATININE: 1.01 mg/dL — AB (ref 0.44–1.00)
Calcium: 8.7 mg/dL — ABNORMAL LOW (ref 8.9–10.3)
GFR calc Af Amer: >60 mL/min (ref >60)
GFR calc non Af Amer: 60 mL/min — ABNORMAL LOW (ref >60)
GLUCOSE: 98 mg/dL (ref 65–99)
POTASSIUM: 4.7 mmol/L (ref 3.5–5.1)
SODIUM: 137 mmol/L (ref 135–145)

## 2015-06-25 LAB — GLUCOSE, CAPILLARY
GLUCOSE-CAPILLARY: 98 mg/dL (ref 65–99)
GLUCOSE-CAPILLARY: 136 mg/dL — AB (ref 65–99)
GLUCOSE-CAPILLARY: 116 mg/dL — AB (ref 65–99)
GLUCOSE-CAPILLARY: 122 mg/dL — AB (ref 65–99)

## 2015-06-25 LAB — LEGIONELLA PNEUMOPHILA SEROGP 1 UR AG: L. pneumophila Serogp 1 Ur Ag: NEGATIVE

## 2015-06-25 LAB — MRSA PCR SCREENING: MRSA BY PCR: NEGATIVE

## 2015-06-25 MED ORDER — INSULIN DETEMIR 100 UNIT/ML FLEXPEN: 10.0000 [IU] | PEN_INJECTOR | Freq: Every day | SUBCUTANEOUS | Status: DC

## 2015-06-25 MED ORDER — ASPIRIN EC 81 MG PO TBEC
81.0000 mg | DELAYED_RELEASE_TABLET | Freq: Every day | ORAL | Status: DC
  Administered 2015-06-25 – 2015-06-26 (×2): 81 mg via ORAL
  Filled 2015-06-25 (×2): qty 1

## 2015-06-25 MED ORDER — INSULIN DETEMIR 100 UNIT/ML ~~LOC~~ SOLN
10.0000 [IU] | Freq: Every day | SUBCUTANEOUS | Status: DC
  Administered 2015-06-25 – 2015-06-26 (×2): 10 [IU] via SUBCUTANEOUS
  Filled 2015-06-25 (×3): qty 0.1

## 2015-06-25 NOTE — Care Management Note (Signed)
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  She will need HHRN for med management, PT, and an aide.  Patient has an aide that helps her with her cleaning at home.  NCM made referral to [place], with [facility].  Patient states she will need a rollator and a bsc also.             Action/Plan:   Expected Discharge Date:                  Expected Discharge Plan:  [place]  In-House Referral:     Discharge planning Services  CM Consult  Post Acute Care Choice:    Choice offered to:  Patient  DME Arranged:  Walker rolling with seat, Bedside commode DME Agency:  [place]:  RN, PT, Nurse's Aide [place] Agency:  [place]  Status of Service:  In process, will continue to follow  Medicare Important Message Given:    Date Medicare IM Given:    Medicare IM give by:    Date Additional Medicare IM Given:    Additional Medicare Important Message give by:     If discussed at [place] of Stay Meetings, dates discussed:    Additional Comments:  Zenon Mayo, RN 06/25/2015, 11:34 AM

## 2015-06-25 NOTE — Progress Notes (Signed)
Pt refused the use of CPAP, Nurse was told this by respiratory and by pt.

## 2015-06-25 NOTE — Progress Notes (Signed)
PROGRESS NOTE  [name]  HQI:[id number] DOB: 1955-09-13  DOA: 06/23/2015 PCP: [name], MD/ Minette Brine FNP-BC at [facility]  Outpatient Specialists:  Cardiology: Dr. Grayland Jack Pain management: Dr. Andree Elk at preferred pain management Psychiatry: Isa Rankin  Brief Narrative:  60 year old female, single, lives alone, ambulates with the help of a walker, extensive PMH: Depression/anxiety/schizophrenia, hypothyroid, GERD, chronic low back pain, morbid obesity, HLD, HTN, DM 2 with neuropathy, OSA on nightly CPAP, stage II chronic kidney disease and breast cancer presented to [facility] ED on 06/23/15 with complaints of dry cough, chest pain on coughing, DOE, 2 day history of diarrhea with blood. She denied fevers, nausea or vomiting. Give history of a syncopal episode on Thursday due to low blood sugars after taking insulin but did not seek medical attention for this. Admitted for suspected community-acquired pneumonia, diarrhea with blood in stools, atypical chest pain. Cardiology consulted for elevated troponin-no further inpatient workup recommended. Transfer to medical bed 5/8. DC home in 1-2 days.  Assessment & Plan:   Principal Problem:   Community acquired pneumonia Active Problems:   Hypothyroidism   HLD (hyperlipidemia)   OBESITY   Depression   Essential hypertension   Asthma   GERD   Breast cancer of upper-outer quadrant of right female breast ([place])   Secondary parkinsonism ([place])   Type II diabetes mellitus ([place])   OSA on CPAP   Chronic lower back pain   CKD (chronic kidney disease), stage III   Diarrhea   Sepsis (HCC)   Elevated troponin   SIRS/presumed Sepsis secondary to community-acquired pneumonia and acute GE - Treated with IV fluids and antibiotics. - Sepsis physiology resolved.  Community-acquired pneumonia - Repeat chest x-ray confirms small area of infiltrate in the right mid lung, likely pneumonia. - CT abdomen and  pelvis: Groundglass and consolidative opacities within the lower lobes bilaterally may represent pneumonia. - Continue empiric IV Rocephin and azithromycin. - Recommend repeating chest x-ray in 3-4 weeks to ensure resolution of pneumonia findings.  Bloody diarrhea - ? Secondary to acute viral gastroenteritis. C. difficile PCR negative. Could not send GI pathogen panel PCR. -  If recurs/worsens or persists, consider GI consultation ([facility]) - Last colonoscopy 2014 by Dr. Michail Sermon remarkable for internal hemorrhoids-may also be the cause. - CT abdomen without acute findings. - DC subcutaneous heparin being used for DVT prophylaxis and use SCDs. - Resolved. No BM in the last 24 hours. Advance diet. Hemoglobin stable.  Atypical chest pain/elevated troponin - Chest pain is atypical features and some reproducible. May be related to pneumonia/pleuritic. Troponin 0.73. EKG without acute changes. - Cardiology consulted: Trend troponin-flat. No inpatient ischemic workup planned at this time. - Nuclear stress test in 2015 was negative. Remains on aspirin. - Cardiology signed off 5/8.  Chronic diastolic CHF - Compensated. Repeat 2-D echo/normal LVEF.  Hyperlipidemia - Statins.  Uncontrolled DM 2 with neuropathy - Hemoglobin A1c: 7.5 on 10/28/14. Follow repeat A1c. Hold oral hypoglycemics. NovoLog SSI.  Essential hypertension - Controlled. Continue ARB, HCTZ.  Leukocytosis - Possibly stress margination. Follow CBCs. Slightly better.  Anxiety/depression/schizophrenia - Stable without suicidal or homicidal ideations or audiovisual hallucinations. Continue home medications.  Hypothyroid - Synthroid. TSH normal.  Chronic pain - Follows with pain management as outpatient.   Hyponatremia - May have been related to intravascular volume depletion. Resolved.  Acute kidney injury - Creatinine was normal at 1.1 on 03/26/15. Presented with creatinine of 1.5. Creatinine has normalized post  hydration.  Anemia -  Baseline hemoglobin probably in the 10 g per DL range. Hemoglobin has dropped to 8.8. Follow CBCs closely and transfuse if hemoglobin <7 g per DL. If continues to have bloody diarrhea or worsening anemia, GI consultation. - Hemoglobin stable.  Morbid obesity/Body mass index is 52.42 kg/(m^2).  OSA on nightly CPAP - Continue CPAP.   Elevated lactate/lactic acidosis - 4.65 on initial arrival. Resolved.     DVT prophylaxis: SCD's Code Status: Full Family Communication: None at bedside. Discussed with patient. Disposition Plan: Remains in stepdown unit for additional 24 hours. Transfer to medical bed 5/8. DC home in the next 48 hours.   Consultants:   Cardiology   Procedures:   None  Antimicrobials:   Rocephin 5/6 >  Azithromycin 5/6 >    Subjective: No chest pain reported. No BM since early yesterday morning. Chronic low back pain issues.  Objective:  Filed Vitals:   06/25/15 0405 06/25/15 0409 06/25/15 0757 06/25/15 1156  BP:  121/69 142/82 108/66  Pulse:  82 89 108  Temp: 98.2 F (36.8 C)  98 F (36.7 C) 98.2 F (36.8 C)  TempSrc: Oral  Oral Oral  Resp:  _0 Height:      Weight:      SpO2:  98% 96% 96%    Intake/Output Summary (Last 24 hours) at 06/25/15 1608 Last data filed at 06/25/15 1500  Gross per 24 hour  Intake   3585 ml  Output   3700 ml  Net   -115 ml   Filed Weights   06/23/15 1152 06/23/15 1624  Weight: 142.883 kg (315 lb) 142.883 kg (315 lb)    Examination:  General exam: Pleasant middle-aged female, moderately built and morbidly obese, lying comfortably supine in bed. Appears calm and comfortable  Respiratory system: Clear to auscultation. Respiratory effort normal.  Cardiovascular system: S1 & S2 heard, RRR.[name] No JVD, murmurs, rubs, gallops or clicks. No pedal edema. Telemetry: Sinus rhythm. Gastrointestinal system: Abdomen is nondistended, soft and nontender. No organomegaly or masses felt. Normal bowel  sounds heard. Central nervous system: Alert and oriented. No focal neurological deficits. Extremities: Symmetric 5 x 5 power. Skin: No rashes, lesions or ulcers Psychiatry: Judgement and insight appear normal. Mood & affect appropriate.     Data Reviewed: I have personally reviewed following labs and imaging studies  CBC:  Recent Labs Lab 06/23/15 2118 06/24/15 0313 06/24/15 0758 06/24/15 1607 06/25/15 0548  WBC 30.5* 25.3* 22.0* 20.5* 18.9*  HGB 10.1* 9.2* 8.8* 8.4* 9.2*  HCT 30.1* 27.7* 26.9* 26.0* 28.3*  MCV 84.8 83.2 83.0 83.9 82.3  PLT 254 240 252 230 010   Basic Metabolic Panel:  Recent Labs Lab 06/23/15 0754 06/23/15 1333 06/24/15 0313 06/25/15 0548  NA 128* 131* 138 137  K 4.3 3.9 4.0 4.7  CL 92* 96* 105 106  CO2 20* 21* 20* 19*  GLUCOSE 132* 147* 129* 98  BUN 27* _1 CREATININE 1.55* 1.28* 1.10* 1.01*  CALCIUM 9.1 8.7* 8.9 8.7*   GFR: Estimated Creatinine Clearance: 86.5 mL/min (by C-G formula based on Cr of 1.01). Liver Function Tests:  Recent Labs Lab 06/23/15 0754 06/23/15 1333 06/24/15 0313  AST 39 31 22  ALT 50 45 36  ALKPHOS 79 74 74  BILITOT 0.6 0.5 0.3  PROT 6.1* 5.4* 5.5*  ALBUMIN 2.5* 2.3* 2.3*    Recent Labs Lab 06/23/15 0754  LIPASE 20   No results for input(s): AMMONIA in the last 168 hours. Coagulation Profile:  Recent Labs Lab 06/23/15 1333 06/24/15 0313  INR 1.46 1.20   Cardiac Enzymes:  Recent Labs Lab 06/24/15 0758 06/24/15 1211 06/24/15 1747  TROPONINI 0.73* 0.30* 0.05*   BNP (last 3 results) No results for input(s): PROBNP in the last 8760 hours. HbA1C:  Recent Labs  06/23/15 1336  HGBA1C 7.3*   CBG:  Recent Labs Lab 06/24/15 1247 06/24/15 1717 06/24/15 2112 06/25/15 0750 06/25/15 1152  GLUCAP 121* 138* 109* 98 136*   Lipid Profile: No results for input(s): CHOL, HDL, LDLCALC, TRIG, CHOLHDL, LDLDIRECT in the last 72 hours. Thyroid Function Tests:  Recent Labs  06/23/15 1334    TSH 2.185   Anemia Panel: No results for input(s): VITAMINB12, FOLATE, FERRITIN, TIBC, IRON, RETICCTPCT in the last 72 hours.  Recent Results (from the past 240 hour(s))  Blood Culture (routine x 2)     Status: None (Preliminary result)   Collection Time: 06/23/15  8:20 AM  Result Value Ref Range Status   Specimen Description BLOOD LEFT ANTECUBITAL  Final   Special Requests BOTTLES DRAWN AEROBIC ONLY 10CC  Final   Culture NO GROWTH 2 DAYS  Final   Report Status PENDING  Incomplete  Blood Culture (routine x 2)     Status: None (Preliminary result)   Collection Time: 06/23/15  8:28 AM  Result Value Ref Range Status   Specimen Description BLOOD LEFT HAND  Final   Special Requests IN PEDIATRIC BOTTLE 4CC  Final   Culture NO GROWTH 2 DAYS  Final   Report Status PENDING  Incomplete  Urine culture     Status: Abnormal   Collection Time: 06/23/15 10:10 AM  Result Value Ref Range Status   Specimen Description URINE, CLEAN CATCH  Final   Special Requests NONE  Final   Culture MULTIPLE SPECIES PRESENT, SUGGEST RECOLLECTION (A)  Final   Report Status 06/24/2015 FINAL  Final  C difficile quick scan w PCR reflex     Status: None   Collection Time: 06/23/15 11:51 PM  Result Value Ref Range Status   C Diff antigen NEGATIVE NEGATIVE Final   C Diff toxin NEGATIVE NEGATIVE Final   C Diff interpretation Negative for toxigenic C. difficile  Final  MRSA PCR Screening     Status: None   Collection Time: 06/25/15 11:36 AM  Result Value Ref Range Status   MRSA by PCR NEGATIVE NEGATIVE Final    Comment:        The GeneXpert MRSA Assay (FDA approved for NASAL specimens only), is one component of a comprehensive MRSA colonization surveillance program. It is not intended to diagnose MRSA infection nor to guide or monitor treatment for MRSA infections.          Radiology Studies: X-ray Chest Pa And Lateral  06/24/2015  CLINICAL DATA:  Sepsis.  History of asthma EXAM: CHEST  2 VIEW  COMPARISON:  Jun 23, 2015 FINDINGS: There is a small area of infiltrate in the right mid lung region. Lungs elsewhere clear. Heart size and pulmonary vascularity are normal. No adenopathy. There are surgical clips in the right cervical -thoracic junction region. IMPRESSION: Small area of infiltrate in the right mid lung region, likely pneumonia. Lungs elsewhere clear. Stable cardiac silhouette. Followup PA and lateral chest radiographs recommended in 3-4 weeks following trial of antibiotic therapy to ensure resolution and exclude underlying malignancy. Electronically Signed   By: Lowella Grip III M.D.   On: 06/24/2015 08:48        Scheduled Meds: . azithromycin  500 mg Intravenous Q24H  . cefTRIAXone (ROCEPHIN)  IV  1 g Intravenous Q24H  . gabapentin  300 mg Oral TID  . gi cocktail  30 mL Oral Once  . irbesartan  150 mg Oral Daily   And  . hydrochlorothiazide  12.5 mg Oral Daily  . insulin aspart  0-9 Units Subcutaneous TID WC  . levothyroxine  175 mcg Oral QAC breakfast  . LORazepam  1-2 mg Oral BID  . lubiprostone  8 mcg Oral BID WC  . pantoprazole  40 mg Oral Daily  . rosuvastatin  20 mg Oral Daily  . sodium chloride flush  3 mL Intravenous Q12H  . tapentadol  125 mg Oral Q6H  . valACYclovir  1,000 mg Oral BID  . venlafaxine XR  150 mg Oral BID  . ziprasidone  40 mg Oral QPC supper   Continuous Infusions:     LOS: 2 days    Time spent: 15 minutes.    [facility], MD [facility] Pager [phone]  If 7PM-7AM, please contact night-coverage [email or web address] Password [email or web address] 06/25/2015, 4:08 PM

## 2015-06-25 NOTE — Progress Notes (Signed)
Patient Name: [name] Date of Encounter: 06/25/2015  Principal Problem:   Community acquired pneumonia Active Problems:   Hypothyroidism   HLD (hyperlipidemia)   OBESITY   Depression   Essential hypertension   Asthma   GERD   Breast cancer of upper-outer quadrant of right female breast ([place])   Secondary parkinsonism ([place])   Type II diabetes mellitus ([place])   OSA on CPAP   Chronic lower back pain   CKD (chronic kidney disease), stage III   Diarrhea   Sepsis ([place])   Elevated troponin   Primary Cardiologist:Dr. [name] Patient Profile: [name] is a 60 y.o. with bipolar disease ,depression,schizophrenia.Admitted on 06/23/15 with abdominal pain, bloody stool,dyspnea with CXR showing RML pneumonia. CT abdomen with no acute process. Had an episode of chest pain during admission after an episode of vomiting. Troponin elevated at 0.73.   SUBJECTIVE: Feels well, no CP or SOB.   OBJECTIVE Filed Vitals:   06/24/15 2310 06/25/15 0405 06/25/15 0409 06/25/15 0757  BP: 103/52  121/69 142/82  Pulse: 91  82 89  Temp:  98.2 F (36.8 C)  98 F (36.7 C)  TempSrc:  Oral  Oral  Resp: _0 Height:      Weight:      SpO2: 93%  98% 96%    Intake/Output Summary (Last 24 hours) at 06/25/15 0923 Last data filed at 06/25/15 0800  Gross per 24 hour  Intake   2205 ml  Output   3400 ml  Net  -1195 ml   Filed Weights   06/23/15 1152 06/23/15 1624  Weight: 315 lb (142.883 kg) 315 lb (142.883 kg)    PHYSICAL EXAM General: Well developed, well nourished, obese female in no acute distress. Head: Normocephalic, atraumatic.  Neck: Supple without bruits, No JVD. Lungs:  Resp regular and unlabored, CTA. Heart: RRR, S1, S2, no S3, S4, or murmur; no rub. Abdomen:Obese, non-tender, non-distended, BS + x 4.  Extremities: No clubbing, cyanosis, No edema.  Neuro: Alert and oriented X 3. Moves all extremities spontaneously. Psych: Normal affect.  LABS: CBC: Recent Labs  06/24/15 1607 06/25/15 0548  WBC 20.5* 18.9*  HGB 8.4* 9.2*  HCT 26.0* 28.3*  MCV 83.9 82.3  PLT 230 243   INR: Recent Labs  06/24/15 0313  INR 2.58   Basic Metabolic Panel: Recent Labs  06/24/15 0313 06/25/15 0548  NA 138 137  K 4.0 4.7  CL 105 106  CO2 20* 19*  GLUCOSE 129* 98  BUN 11 12  CREATININE 1.10* 1.01*  CALCIUM 8.9 8.7*   Liver Function Tests: Recent Labs  06/23/15 1333 06/24/15 0313  AST 31 22  ALT 45 36  ALKPHOS 74 74  BILITOT 0.5 0.3  PROT 5.4* 5.5*  ALBUMIN 2.3* 2.3*   Cardiac Enzymes: Recent Labs  06/24/15 0758 06/24/15 1211 06/24/15 1747  TROPONINI 0.73* 0.30* 0.05*    Recent Labs  06/23/15 0749 06/23/15 1551  TROPIPOC 0.03 0.01   BNP:  B NATRIURETIC PEPTIDE  Date/Time Value Ref Range Status  06/23/2015 01:33 PM 162.2* 0.0 - 100.0 pg/mL Final    Thyroid Function Tests: Recent Labs  06/23/15 1334  TSH 2.185     Current facility-administered medications:  .  acetaminophen (TYLENOL) tablet 650 mg, 650 mg, Oral, Q6H PRN **OR** acetaminophen (TYLENOL) suppository 650 mg, 650 mg, Rectal, Q6H PRN, Rondel Jumbo, PA-C .  azithromycin (ZITHROMAX) 500 mg in dextrose 5 % 250 mL IVPB, 500 mg,  Intravenous, Q24H, Kendra P Hiatt, RPH, 500 mg at 06/24/15 1058 .  cefTRIAXone (ROCEPHIN) 1 g in dextrose 5 % 50 mL IVPB, 1 g, Intravenous, Q24H, Roma Schanz, RPH, 1 g at 06/24/15 2211 .  gabapentin (NEURONTIN) capsule 300 mg, 300 mg, Oral, TID, Rondel Jumbo, PA-C, 300 mg at 06/24/15 2205 .  gi cocktail (Maalox,Lidocaine,Donnatal), 30 mL, Oral, Once, Rondel Jumbo, PA-C .  irbesartan (AVAPRO) tablet 150 mg, 150 mg, Oral, Daily, 150 mg at 06/24/15 0847 **AND** hydrochlorothiazide (MICROZIDE) capsule 12.5 mg, 12.5 mg, Oral, Daily, Romona Curls, RPH, 12.5 mg at 06/24/15 0848 .  insulin aspart (novoLOG) injection 0-9 Units, 0-9 Units, Subcutaneous, TID WC, Rondel Jumbo, PA-C, 1 Units at 06/24/15 1729 .  levothyroxine (SYNTHROID,  LEVOTHROID) tablet 175 mcg, 175 mcg, Oral, QAC breakfast, Rondel Jumbo, PA-C, 175 mcg at 06/25/15 0827 .  LORazepam (ATIVAN) tablet 1-2 mg, 1-2 mg, Oral, BID, Rondel Jumbo, PA-C, 2 mg at 06/24/15 2204 .  lubiprostone (AMITIZA) capsule 8 mcg, 8 mcg, Oral, BID WC, Rondel Jumbo, PA-C, 8 mcg at 06/23/15 1731 .  ondansetron (ZOFRAN) tablet 4 mg, 4 mg, Oral, Q6H PRN **OR** [DISCONTINUED] ondansetron (ZOFRAN) injection 4 mg, 4 mg, Intravenous, Q6H PRN, Rondel Jumbo, PA-C .  oxyCODONE (Oxy IR/ROXICODONE) immediate release tablet 15 mg, 15 mg, Oral, Q6H PRN, Rondel Jumbo, PA-C, 15 mg at 06/25/15 0827 .  pantoprazole (PROTONIX) EC tablet 40 mg, 40 mg, Oral, Daily, Rondel Jumbo, PA-C, 40 mg at 06/24/15 0847 .  rosuvastatin (CRESTOR) tablet 20 mg, 20 mg, Oral, Daily, Rondel Jumbo, PA-C, 20 mg at 06/24/15 0848 .  sodium chloride flush (NS) 0.9 % injection 3 mL, 3 mL, Intravenous, Q12H, Coralee Pesa Wertman, PA-C, 3 mL at 06/24/15 0849 .  tapentadol (NUCYNTA) tablet 125 mg, 125 mg, Oral, Q6H, Rondel Jumbo, PA-C, 125 mg at 06/25/15 0610 .  valACYclovir (VALTREX) tablet 1,000 mg, 1,000 mg, Oral, BID, Rondel Jumbo, PA-C, 1,000 mg at 06/24/15 2205 .  venlafaxine XR (EFFEXOR-XR) 24 hr capsule 150 mg, 150 mg, Oral, BID, Rondel Jumbo, PA-C, 150 mg at 06/24/15 2205 .  ziprasidone (GEODON) capsule 40 mg, 40 mg, Oral, QPC supper, Rondel Jumbo, PA-C, 40 mg at 06/24/15 1846   TELE:   NSR     ECG: Sinus tach, no ST abnormalities.   Radiology/Studies: X-ray Chest Pa And Lateral  06/24/2015  CLINICAL DATA:  Sepsis.  History of asthma EXAM: CHEST  2 VIEW COMPARISON:  Jun 23, 2015 FINDINGS: There is a small area of infiltrate in the right mid lung region. Lungs elsewhere clear. Heart size and pulmonary vascularity are normal. No adenopathy. There are surgical clips in the right cervical -thoracic junction region. IMPRESSION: Small area of infiltrate in the right mid lung region, likely pneumonia. Lungs elsewhere  clear. Stable cardiac silhouette. Followup PA and lateral chest radiographs recommended in 3-4 weeks following trial of antibiotic therapy to ensure resolution and exclude underlying malignancy. Electronically Signed   By: Lowella Grip III M.D.   On: 06/24/2015 08:48   Ct Abdomen Pelvis W Contrast  06/23/2015  CLINICAL DATA:  Patient with mid abdominal pain, nausea vomiting and diarrhea. EXAM: CT ABDOMEN AND PELVIS WITH CONTRAST TECHNIQUE: Multidetector CT imaging of the abdomen and pelvis was performed using the standard protocol following bolus administration of intravenous contrast. CONTRAST:  75cc ISOVUE-300 IOPAMIDOL (ISOVUE-300) INJECTION 61% COMPARISON:  None. FINDINGS: Lower chest: There are ground-glass and consolidative  opacities within the lower lobes bilaterally. Heart is enlarged. Small hiatal hernia. Hepatobiliary: Liver is normal in size and contour. No focal lesion is identified. Patient status post cholecystectomy. Pancreas: Fatty atrophy of the pancreatic parenchyma. Spleen: Unremarkable Adrenals/Urinary Tract: Normal adrenal glands. Kidneys enhance symmetrically with contrast. No hydronephrosis. Urinary bladder is unremarkable. Stomach/Bowel: No abnormal bowel wall thickening or evidence for bowel obstruction. No free fluid or free intraperitoneal air. Vascular/Lymphatic: Normal caliber abdominal aorta. No retroperitoneal lymphadenopathy. Other: Uterus is surgically absent. Adnexal structures are unremarkable. Musculoskeletal: Lumbar spine degenerative changes. No aggressive or acute appearing osseous lesions. IMPRESSION: Ground-glass and consolidative opacities within the lower lobes bilaterally may represent pneumonia in the appropriate clinical setting. Recommend radiographic follow-up in 3- 4 weeks to ensure resolution. No acute process within the abdomen or pelvis. Electronically Signed   By: Lovey Newcomer M.D.   On: 06/23/2015 10:14     Current Medications:  . azithromycin  500 mg  Intravenous Q24H  . cefTRIAXone (ROCEPHIN)  IV  1 g Intravenous Q24H  . gabapentin  300 mg Oral TID  . gi cocktail  30 mL Oral Once  . irbesartan  150 mg Oral Daily   And  . hydrochlorothiazide  12.5 mg Oral Daily  . insulin aspart  0-9 Units Subcutaneous TID WC  . levothyroxine  175 mcg Oral QAC breakfast  . LORazepam  1-2 mg Oral BID  . lubiprostone  8 mcg Oral BID WC  . pantoprazole  40 mg Oral Daily  . rosuvastatin  20 mg Oral Daily  . sodium chloride flush  3 mL Intravenous Q12H  . tapentadol  125 mg Oral Q6H  . valACYclovir  1,000 mg Oral BID  . venlafaxine XR  150 mg Oral BID  . ziprasidone  40 mg Oral QPC supper      ASSESSMENT AND PLAN: Principal Problem:   Community acquired pneumonia Active Problems:   Hypothyroidism   HLD (hyperlipidemia)   OBESITY   Depression   Essential hypertension   Asthma   GERD   Breast cancer of upper-outer quadrant of right female breast ([place])   Secondary parkinsonism ([place])   Type II diabetes mellitus ([place])   OSA on CPAP   Chronic lower back pain   CKD (chronic kidney disease), stage III   Diarrhea   Sepsis (HCC)   Elevated troponin  1. Elevated troponin: patient had elevation in troponin, and one episode of chest pain after vomiting. Patient is admitted with sepsis secondary to CAP and acute GE. Pain most likely related to her acute illness. She had a Lexiscan Myoview in June of 2015 that was low risk. She does have risk factors for CAD including HLD, HTN, DM, obesity and OSA. Could consider doing outpatient Lexiscan once she is better from her actue illness.   2. HTN: On ARB and HCTZ at home, BP well controlled.   Signed, Arbutus Leas , NP 9:23 AM 06/25/2015 Pager [phone]

## 2015-06-26 LAB — CBC
HEMATOCRIT: 30.1 % — AB (ref 36.0–46.0)
Hemoglobin: 9.8 g/dL — ABNORMAL LOW (ref 12.0–15.0)
MCH: 27.4 pg (ref 26.0–34.0)
MCHC: 32.6 g/dL (ref 30.0–36.0)
MCV: 84.1 fL (ref 78.0–100.0)
PLATELETS: 265 10*3/uL (ref 150–400)
RBC: 3.58 MIL/uL — AB (ref 3.87–5.11)
RDW: 18.2 % — ABNORMAL HIGH (ref 11.5–15.5)
WBC: 11.9 10*3/uL — AB (ref 4.0–10.5)

## 2015-06-26 LAB — BASIC METABOLIC PANEL
ANION GAP: 12 (ref 5–15)
BUN: 14 mg/dL (ref 6–20)
CALCIUM: 9.2 mg/dL (ref 8.9–10.3)
CO2: 22 mmol/L (ref 22–32)
CREATININE: 1.2 mg/dL — AB (ref 0.44–1.00)
Chloride: 103 mmol/L (ref 101–111)
GFR calc Af Amer: 56 mL/min — ABNORMAL LOW (ref >60)
GFR, EST NON AFRICAN AMERICAN: 48 mL/min — AB (ref >60)
GLUCOSE: 104 mg/dL — AB (ref 65–99)
POTASSIUM: 4.1 mmol/L (ref 3.5–5.1)
Sodium: 137 mmol/L (ref 135–145)

## 2015-06-26 LAB — GLUCOSE, CAPILLARY
GLUCOSE-CAPILLARY: 110 mg/dL — AB (ref 65–99)
GLUCOSE-CAPILLARY: 164 mg/dL — AB (ref 65–99)
Glucose-Capillary: 94 mg/dL (ref 65–99)
Glucose-Capillary: 96 mg/dL (ref 65–99)

## 2015-06-26 MED ORDER — AZITHROMYCIN 500 MG PO TABS
500.0000 mg | ORAL_TABLET | Freq: Every day | ORAL | Status: DC
  Administered 2015-06-26 – 2015-06-27 (×2): 500 mg via ORAL
  Filled 2015-06-26 (×2): qty 1

## 2015-06-26 NOTE — Progress Notes (Signed)
PT Cancellation Note  Patient Details Name: [name] MRN: [id number] DOB: [birth year]   Cancelled Treatment:    Reason Eval/Treat Not Completed: Patient declined, no reason specifiedPt reported "over 10"/10 pain and declined mobility. Patient requesting pain meds--RN aware. PT will check on pt later as time allows.    Salina April, PTA Pager: [phone]   06/26/2015, 8:51 AM

## 2015-06-26 NOTE — Progress Notes (Signed)
Physical Therapy Treatment Patient Details Name: [name] MRN: [id number] DOB: 28-Apr-1955 Today's Date: 06/26/2015    History of Present Illness [name] is a 60 y.o. female with a Past Medical History of DM2 insulin dependent, left breast cancer not currently on treatment, htn, OSA on home cpap, ckd stage 3, schizophrenia, who presents w/ numerous complaints including bloody diarrhea x 2 days (currently resolved), doe and substernal chest pain w/o radiation.Presumed Sepsis/SIRS, likely related to CAP and in the setting of Bloody diarrhea    PT Comments    Patient with gradual progress toward mobility goals. Limited by pain. Continue to progress as tolerated.   Follow Up Recommendations  Home health PT;Supervision/Assistance - 24 hour     Equipment Recommendations  Rolling walker with 5" wheels;3in1 (PT)    Recommendations for Other Services OT consult     Precautions / Restrictions Precautions Precautions: Fall Restrictions Weight Bearing Restrictions: No    Mobility  Bed Mobility Overal bed mobility: Needs Assistance Bed Mobility: Supine to Sit;Sit to Supine     Supine to sit: Min guard Sit to supine: Min guard   General bed mobility comments: no physical assist needed; use of bedrail; cues for sequencing and technique; increased time needed  Transfers Overall transfer level: Needs assistance Equipment used: Rolling walker (2 wheeled) Transfers: Sit to/from Stand Sit to Stand: Min guard         General transfer comment: from EOB and BSC; cues for hand placement; min guard for safety and increased time to achieve standing; pt unable to achieve erect posture and maintained trunk flexion in standing  Ambulation/Gait Ambulation/Gait assistance: Min assist Ambulation Distance (Feet): 30 Feet (10, 20) Assistive device: Rolling walker (2 wheeled) Gait Pattern/deviations: Step-through pattern;Decreased stride length;Trunk flexed Gait velocity: quite  slow   General Gait Details: cues for posture and safe use of RW with assist to manage RW; pt with trunk flexed and tendency to push RW very far away especially when turning; one seated rest break   Stairs            Wheelchair Mobility    Modified Rankin (Stroke Patients Only)       Balance Overall balance assessment: Needs assistance   Sitting balance-Leahy Scale: Good     Standing balance support: Bilateral upper extremity supported Standing balance-Leahy Scale: Poor                      Cognition Arousal/Alertness: Awake/alert Behavior During Therapy: WFL for tasks assessed/performed;Flat affect Overall Cognitive Status: Within Functional Limits for tasks assessed                      Exercises      General Comments        Pertinent Vitals/Pain Pain Assessment: 0-10 Pain Score: 9  Pain Location: back Pain Descriptors / Indicators: Aching;Constant;Moaning;Sore Pain Intervention(s): Limited activity within patient's tolerance;Monitored during session;Premedicated before session;Repositioned    Home Living                      Prior Function            PT Goals (current goals can now be found in the care plan section) Acute Rehab PT Goals Patient Stated Goal: feel better PT Goal Formulation: With patient Time For Goal Achievement: 07/08/15 Potential to Achieve Goals: Good Progress towards PT goals: Progressing toward goals    Frequency  Min 3X/week  PT Plan Current plan remains appropriate    Co-evaluation             End of Session Equipment Utilized During Treatment: Gait belt Activity Tolerance: Patient limited by pain Patient left: in chair;with call bell/phone within reach;with bed alarm set;with SCD's reapplied     Time: 0354-6568 PT Time Calculation (min) (ACUTE ONLY): 30 min  Charges:  $Gait Training: 8-22 mins $Therapeutic Activity: 8-22 mins                    G Codes:      Salina April, PTA Pager: [phone]   06/26/2015, 12:05 PM

## 2015-06-26 NOTE — Progress Notes (Signed)
PROGRESS NOTE  [name]  GBT:[id number] DOB: 1955-11-16  DOA: 06/23/2015 PCP: [name], MD/ Minette Brine FNP-BC at [facility]  Outpatient Specialists:  Cardiology: Dr. Grayland Jack Pain management: Dr. Andree Elk at preferred pain management Psychiatry: Isa Rankin  Brief Narrative:  60 year old female, single, lives alone, ambulates with the help of a walker, extensive PMH: Depression/anxiety/schizophrenia, hypothyroid, GERD, chronic low back pain, morbid obesity, HLD, HTN, DM 2 with neuropathy, OSA on nightly CPAP, stage II chronic kidney disease and breast cancer presented to [facility] ED on 06/23/15 with complaints of dry cough, chest pain on coughing, DOE, 2 day history of diarrhea with blood. She denied fevers, nausea or vomiting. Give history of a syncopal episode on Thursday due to low blood sugars after taking insulin but did not seek medical attention for this. Admitted for suspected community-acquired pneumonia, diarrhea with blood in stools, atypical chest pain. Cardiology consulted for elevated troponin-no further inpatient workup recommended. Transfer to medical bed 5/8. DC home possibly 5/9.  Assessment & Plan:   Principal Problem:   Community acquired pneumonia Active Problems:   Hypothyroidism   HLD (hyperlipidemia)   OBESITY   Depression   Essential hypertension   Asthma   GERD   Breast cancer of upper-outer quadrant of right female breast ([place])   Secondary parkinsonism ([place])   Type II diabetes mellitus ([place])   OSA on CPAP   Chronic lower back pain   CKD (chronic kidney disease), stage III   Diarrhea   Sepsis (HCC)   Elevated troponin   SIRS/presumed Sepsis secondary to community-acquired pneumonia and acute GE - Treated with IV fluids and antibiotics. - Sepsis physiology resolved.  Community-acquired pneumonia - Repeat chest x-ray confirms small area of infiltrate in the right mid lung, likely pneumonia. - CT abdomen and  pelvis: Groundglass and consolidative opacities within the lower lobes bilaterally may represent pneumonia. - Continue empiric IV Rocephin and azithromycin. - Recommend repeating chest x-ray in 3-4 weeks to ensure resolution of pneumonia findings.  Bloody diarrhea - ? Secondary to acute viral gastroenteritis. C. difficile PCR negative. Could not send GI pathogen panel PCR. -  If recurs/worsens or persists, consider GI consultation ([facility]) - Last colonoscopy 2014 by Dr. Michail Sermon remarkable for internal hemorrhoids-may also be the cause. - CT abdomen without acute findings. - DC subcutaneous heparin being used for DVT prophylaxis and use SCDs. - Resolved. No BM in the last 48 hours. Advance diet. Hemoglobin stable.  Atypical chest pain/elevated troponin - Chest pain is atypical features and some reproducible. May be related to pneumonia/pleuritic. Troponin 0.73. EKG without acute changes. - Cardiology consulted: Trend troponin-flat. No inpatient ischemic workup planned at this time. - Nuclear stress test in 2015 was negative. Remains on aspirin. - Cardiology signed off 5/8.  Chronic diastolic CHF - Compensated. Repeat 2-D echo/normal LVEF.  Hyperlipidemia - Statins.  Uncontrolled DM 2 with neuropathy - Hemoglobin A1c: 7.5 on 10/28/14. Follow repeat A1c. Hold oral hypoglycemics. NovoLog SSI.  Essential hypertension - Controlled. Continue ARB, HCTZ.  Leukocytosis - Possibly stress margination. Improved.  Anxiety/depression/schizophrenia - Stable without suicidal or homicidal ideations or audiovisual hallucinations. Continue home medications.  Hypothyroid - Synthroid. TSH normal.  Chronic pain - Follows with pain management as outpatient.   Hyponatremia - May have been related to intravascular volume depletion. Resolved.  Acute kidney injury - Creatinine was normal at 1.1 on 03/26/15. Presented with creatinine of 1.5. Creatinine has normalized post hydration.  Anemia -  Baseline hemoglobin probably in  the 10 g per DL range. Hemoglobin has dropped to 8.8. Follow CBCs closely and transfuse if hemoglobin <7 g per DL. If continues to have bloody diarrhea or worsening anemia, GI consultation. - Hemoglobin stable.  Morbid obesity/Body mass index is 51.07 kg/(m^2).  OSA on nightly CPAP - Continue CPAP.   Elevated lactate/lactic acidosis - 4.65 on initial arrival. Resolved.     DVT prophylaxis: SCD's Code Status: Full Family Communication: None at bedside. Discussed with patient. Disposition Plan: Remains in stepdown unit for additional 24 hours. Transferred to medical bed 5/8. DC home possibly 5/10.   Consultants:   Cardiology -signed off.  Procedures:   None  Antimicrobials:   Rocephin 5/6 >  Azithromycin 5/6 >    Subjective: Mild nonproductive cough. Chronic DOE-unchanged. Chronic pain.  Objective:  Filed Vitals:   06/25/15 1456 06/25/15 1855 06/25/15 2134 06/26/15 0253  BP: 149/75 122/58 116/64 111/66  Pulse: 97 95 81 80  Temp: 98.3 F (36.8 C) 98.5 F (36.9 C) 98.5 F (36.9 C) 98.3 F (36.8 C)  TempSrc: Oral Oral Oral Oral  Resp: _0 Height:      Weight:  139.2 kg (306 lb 14.1 oz)    SpO2: 97% 94% 98% 96%    Intake/Output Summary (Last 24 hours) at 06/26/15 1346 Last data filed at 06/26/15 0900  Gross per 24 hour  Intake   1440 ml  Output   2100 ml  Net   -660 ml   Filed Weights   06/23/15 1152 06/23/15 1624 06/25/15 1855  Weight: 142.883 kg (315 lb) 142.883 kg (315 lb) 139.2 kg (306 lb 14.1 oz)    Examination:  General exam: Pleasant middle-aged female, moderately built and morbidly obese, Sitting up comfortably in bed eating breakfast this morning.  Respiratory system: Clear to auscultation except occasional basal crackles. Respiratory effort normal.  Cardiovascular system: S1 & S2 heard, RRR.[name] No JVD, murmurs, rubs, gallops or clicks. No pedal edema. Gastrointestinal system: Abdomen is nondistended, soft  and nontender. No organomegaly or masses felt. Normal bowel sounds heard. Central nervous system: Alert and oriented. No focal neurological deficits. Extremities: Symmetric 5 x 5 power. Skin: No rashes, lesions or ulcers Psychiatry: Judgement and insight appear normal. Mood & affect appropriate.     Data Reviewed: I have personally reviewed following labs and imaging studies  CBC:  Recent Labs Lab 06/24/15 0313 06/24/15 0758 06/24/15 1607 06/25/15 0548 06/26/15 0541  WBC 25.3* 22.0* 20.5* 18.9* 11.9*  HGB 9.2* 8.8* 8.4* 9.2* 9.8*  HCT 27.7* 26.9* 26.0* 28.3* 30.1*  MCV 83.2 83.0 83.9 82.3 84.1  PLT 240 252 230 243 389   Basic Metabolic Panel:  Recent Labs Lab 06/23/15 0754 06/23/15 1333 06/24/15 0313 06/25/15 0548 06/26/15 0541  NA 128* 131* 138 137 137  K 4.3 3.9 4.0 4.7 4.1  CL 92* 96* 105 106 103  CO2 20* 21* 20* 19* 22  GLUCOSE 132* 147* 129* 98 104*  BUN 27* _1 CREATININE 1.55* 1.28* 1.10* 1.01* 1.20*  CALCIUM 9.1 8.7* 8.9 8.7* 9.2   GFR: Estimated Creatinine Clearance: 71.6 mL/min (by C-G formula based on Cr of 1.2). Liver Function Tests:  Recent Labs Lab 06/23/15 0754 06/23/15 1333 06/24/15 0313  AST 39 31 22  ALT 50 45 36  ALKPHOS 79 74 74  BILITOT 0.6 0.5 0.3  PROT 6.1* 5.4* 5.5*  ALBUMIN 2.5* 2.3* 2.3*    Recent Labs Lab 06/23/15 0754  LIPASE 20  No results for input(s): AMMONIA in the last 168 hours. Coagulation Profile:  Recent Labs Lab 06/23/15 1333 06/24/15 0313  INR 1.46 1.20   Cardiac Enzymes:  Recent Labs Lab 06/24/15 0758 06/24/15 1211 06/24/15 1747  TROPONINI 0.73* 0.30* 0.05*   BNP (last 3 results) No results for input(s): PROBNP in the last 8760 hours. HbA1C: No results for input(s): HGBA1C in the last 72 hours. CBG:  Recent Labs Lab 06/25/15 1152 06/25/15 1658 06/25/15 2245 06/26/15 0827 06/26/15 1300  GLUCAP 136* 116* 122* 94 96   Lipid Profile: No results for input(s): CHOL, HDL,  LDLCALC, TRIG, CHOLHDL, LDLDIRECT in the last 72 hours. Thyroid Function Tests: No results for input(s): TSH, T4TOTAL, FREET4, T3FREE, THYROIDAB in the last 72 hours. Anemia Panel: No results for input(s): VITAMINB12, FOLATE, FERRITIN, TIBC, IRON, RETICCTPCT in the last 72 hours.  Recent Results (from the past 240 hour(s))  Blood Culture (routine x 2)     Status: None (Preliminary result)   Collection Time: 06/23/15  8:20 AM  Result Value Ref Range Status   Specimen Description BLOOD LEFT ANTECUBITAL  Final   Special Requests BOTTLES DRAWN AEROBIC ONLY 10CC  Final   Culture NO GROWTH 2 DAYS  Final   Report Status PENDING  Incomplete  Blood Culture (routine x 2)     Status: None (Preliminary result)   Collection Time: 06/23/15  8:28 AM  Result Value Ref Range Status   Specimen Description BLOOD LEFT HAND  Final   Special Requests IN PEDIATRIC BOTTLE 4CC  Final   Culture NO GROWTH 2 DAYS  Final   Report Status PENDING  Incomplete  Urine culture     Status: Abnormal   Collection Time: 06/23/15 10:10 AM  Result Value Ref Range Status   Specimen Description URINE, CLEAN CATCH  Final   Special Requests NONE  Final   Culture MULTIPLE SPECIES PRESENT, SUGGEST RECOLLECTION (A)  Final   Report Status 06/24/2015 FINAL  Final  C difficile quick scan w PCR reflex     Status: None   Collection Time: 06/23/15 11:51 PM  Result Value Ref Range Status   C Diff antigen NEGATIVE NEGATIVE Final   C Diff toxin NEGATIVE NEGATIVE Final   C Diff interpretation Negative for toxigenic C. difficile  Final  MRSA PCR Screening     Status: None   Collection Time: 06/25/15 11:36 AM  Result Value Ref Range Status   MRSA by PCR NEGATIVE NEGATIVE Final    Comment:        The GeneXpert MRSA Assay (FDA approved for NASAL specimens only), is one component of a comprehensive MRSA colonization surveillance program. It is not intended to diagnose MRSA infection nor to guide or monitor treatment for MRSA  infections.          Radiology Studies: No results found.      Scheduled Meds: . aspirin EC  81 mg Oral QHS  . azithromycin  500 mg Oral Daily  . cefTRIAXone (ROCEPHIN)  IV  1 g Intravenous Q24H  . gabapentin  300 mg Oral TID  . irbesartan  150 mg Oral Daily   And  . hydrochlorothiazide  12.5 mg Oral Daily  . insulin aspart  0-9 Units Subcutaneous TID WC  . insulin detemir  10 Units Subcutaneous QHS  . levothyroxine  175 mcg Oral QAC breakfast  . LORazepam  1-2 mg Oral BID  . lubiprostone  8 mcg Oral BID WC  . pantoprazole  40 mg Oral  Daily  . rosuvastatin  20 mg Oral Daily  . sodium chloride flush  3 mL Intravenous Q12H  . tapentadol  125 mg Oral Q6H  . valACYclovir  1,000 mg Oral BID  . venlafaxine XR  150 mg Oral BID  . ziprasidone  40 mg Oral QPC supper   Continuous Infusions:     LOS: 3 days    Time spent: 15 minutes.    [facility], MD [facility] Pager [phone]  If 7PM-7AM, please contact night-coverage [email or web address] Password [email or web address] 06/26/2015, 1:46 PM

## 2015-06-26 NOTE — Consult Note (Signed)
   [facility] Inpatient Consult   06/26/2015  [name] Aug 22, 1955 [id number] Patient was assessed for [name] for community services. Patient was previously active with [name].  Met with patient at bedside regarding being restarted with [name] services. Patient states she was doing good with the home health agency.  She states she has gotten [name] for her transportation to her MD appointments.  She states she is still in need of a rolling walker and a bedside commode.  Patient states she is receiving personal care services from [name] for 1 hour a day.  Patient would like [facility] services for post hospital follow up for possible meals and more hours with [name].  Consent form active on file and contact information given.  Of note, [name] services does not replace or interfere with any services that are arranged by inpatient case management or social work. For additional questions or referrals please contact: Natividad Brood, RN BSN [name] Liaison  [phone] business mobile phone Toll free office [phone]

## 2015-06-26 NOTE — Care Management Note (Signed)
Case Management Note  Patient Details  Name: LARAH KUNTZMAN MRN: [id number] Date of Birth: 03/20/1955  Subjective/Objective:  NCM received call from [facility] stating not able to take patient,  Patient states that Arville Go would be ok to work with, referral made to [facility] with Arville Go for [facility] [place], and aide.  Will need orders in for [facility], PT and aide  From MD.  Soc will begin 24-48 hrs post dc.                  Action/Plan:   Expected Discharge Date:                  Expected Discharge Plan:  [place]  In-House Referral:     Discharge planning Services  CM Consult  Post Acute Care Choice:    Choice offered to:  Patient  DME Arranged:  Walker rolling with seat, Bedside commode DME Agency:  [place]:  RN, PT, Nurse's Aide [place] Agency:  [place]  Status of Service:  Completed, signed off  Medicare Important Message Given:    Date Medicare IM Given:    Medicare IM give by:    Date Additional Medicare IM Given:    Additional Medicare Important Message give by:     If discussed at [place] of Stay Meetings, dates discussed:    Additional Comments:  Zenon Mayo, RN 06/26/2015, 5:14 PM

## 2015-06-27 LAB — GLUCOSE, CAPILLARY
Glucose-Capillary: 107 mg/dL — ABNORMAL HIGH (ref 65–99)
Glucose-Capillary: 119 mg/dL — ABNORMAL HIGH (ref 65–99)
Glucose-Capillary: 124 mg/dL — ABNORMAL HIGH (ref 65–99)

## 2015-06-27 MED ORDER — LEVOFLOXACIN 500 MG PO TABS: 500.0000 mg | ORAL_TABLET | Freq: Every day | ORAL | Status: DC

## 2015-06-27 NOTE — Discharge Summary (Signed)
Physician Discharge Summary  [name]:[id number] DOB: 1955-07-22 DOA: 06/23/2015  PCP: [name], MD  Admit date: 06/23/2015 Discharge date: 06/27/2015  Time spent: 35 minutes  Recommendations for Outpatient Follow-up:  1. Patient should complete 2 more days of by mouth Levaquin 2. Patient would benefit from outpatient psychiatry as well as preferred pain management follow-up for his usual meds 3. Would recommend also patient follow up for outpatient colonoscopy  Discharge Diagnoses:  Principal Problem:   Community acquired pneumonia Active Problems:   Hypothyroidism   HLD (hyperlipidemia)   OBESITY   Depression   Essential hypertension   Asthma   GERD   Breast cancer of upper-outer quadrant of right female breast ([place])   Secondary parkinsonism ([place])   Type II diabetes mellitus ([place])   OSA on CPAP   Chronic lower back pain   CKD (chronic kidney disease), stage III   Diarrhea   Sepsis ([place])   Elevated troponin   Discharge Condition: Improved  Diet recommendation: Heart healthy  Filed Weights   06/23/15 1152 06/23/15 1624 06/25/15 1855  Weight: 142.883 kg (315 lb) 142.883 kg (315 lb) 139.2 kg (306 lb 14.1 oz)    History of present illness:  60 year old female, single, lives alone, ambulates with the help of a walker, extensive PMH: Depression/anxiety/schizophrenia, hypothyroid, GERD, chronic low back pain, morbid obesity, HLD, HTN, DM 2 with neuropathy, OSA on nightly CPAP, stage II chronic kidney disease and breast cancer presented to [facility] ED on 06/23/15 with complaints of dry cough, chest pain on coughing, DOE, 2 day history of diarrhea with blood. She denied fevers, nausea or vomiting. Give history of a syncopal episode on Thursday due to low blood sugars after taking insulin but did not seek medical attention for this. Admitted for suspected community-acquired pneumonia, diarrhea with blood in stools, atypical chest pain. Cardiology consulted for elevated  troponin-no further inpatient workup recommended. Transfer to medical bed 5/8.  Hospital Course:  SIRS/presumed Sepsis secondary to community-acquired pneumonia and acute GE - Treated with IV fluids and antibiotics. - Sepsis physiology resolved. -We will discharge home on by mouth Levaquin 500 until 06/29/2015, completed 5 days of IV antibiotics  Community-acquired pneumonia - Repeat chest x-ray confirms small area of infiltrate in the right mid lung, likely pneumonia. - CT abdomen and pelvis: Groundglass and consolidative opacities within the lower lobes bilaterally may represent pneumonia.  Bloody diarrhea - ? Secondary to acute viral gastroenteritis. C. difficile PCR negative. Could not send GI pathogen panel PCR. - Last colonoscopy 2014 by Dr. Michail Sermon remarkable for internal hemorrhoids-may also be the cause. -Outpatient follow-up with the [facility] gastroenterology recommended - CT abdomen without acute findings. - DC subcutaneous heparin being used for DVT prophylaxis and use SCDs. - Resolved. No BM in the last 48 hours. Advance diet. Hemoglobin stable.  Atypical chest pain/elevated troponin - Chest pain is atypical features and some reproducible. May be related to pneumonia/pleuritic. Troponin 0.73. EKG without acute changes. - Cardiology consulted: Trend troponin-flat. No inpatient ischemic workup planned at this time. - Nuclear stress test in 2015 was negative. Remains on aspirin. - Cardiology signed off 5/8.  Chronic diastolic CHF - Compensated. Repeat 2-D echo/normal LVEF.  Hyperlipidemia - Statins.  Uncontrolled DM 2 with neuropathy - Hemoglobin A1c: 7.5 on 10/28/14. Follow repeat A1c. Hold oral hypoglycemics. NovoLog SSI resume on discharge  Essential hypertension - Controlled. Continue ARB, HCTZ.  Leukocytosis - Possibly stress margination. Improved.  Anxiety/depression/schizophrenia - Stable without suicidal or homicidal ideations or audiovisual hallucinations.  Continue home medications.  Hypothyroid - Synthroid. TSH normal.  Chronic pain - Follows with pain management as outpatient.  Hyponatremia - May have been related to intravascular volume depletion. Resolved.  Acute kidney injury - Creatinine was normal at 1.1 on 03/26/15. Presented with creatinine of 1.5. Creatinine has normalized post hydration.  Anemia - Baseline hemoglobin probably in the 10 g per DL range. Hemoglobin has dropped to 8.8. Follow CBCs closely and transfuse if hemoglobin <7 g per DL. If continues to have bloody diarrhea or worsening anemia, GI consultation. - Hemoglobin stable.  Morbid obesity/Body mass index is 51.07 kg/(m^2).  OSA on nightly CPAP - Continue CPAP.   Elevated lactate/lactic acidosis - 4.65 on initial arrival. Resolved.  Consultants:   Cardiology -signed off.  Procedures:   None  Antimicrobials:   Rocephin 5/6 > 06/27/2015  Azithromycin 5/6 > 10 2017  Levaquin  Discharge Exam: Filed Vitals:   06/27/15 0503 06/27/15 1405  BP: 130/92 133/87  Pulse: 96 98  Temp: 98.1 F (36.7 C) 98.6 F (37 C)  Resp: 18 18    General: Alert pleasant oriented no apparent distress Cardiovascular: S1-S2 no murmur rub or gallop Respiratory: Clinically clear no added sound  Discharge Instructions   Discharge Instructions    AMB Referral to [facility]    Complete by:  As directed   Reason for consult:  Post hospital follow up - restart of services  Expected date of contact:  1-3 days (reserved for hospital discharges)  Please assign to community nurse for transition of care calls and assess for home visits. Home with Home Health, has DME needs.  Check for meal assistance with Ana[facility]le.  Questions please call:   Natividad Brood, RN BSN [facility] Liaison  [phone] business mobile phone Toll free office [phone]     Diet - low sodium heart healthy    Complete by:  As directed      Increase activity  slowly    Complete by:  As directed           Current Discharge Medication List    START taking these medications   Details  levofloxacin (LEVAQUIN) 500 MG tablet Take 1 tablet (500 mg total) by mouth daily. Qty: 2 tablet, Refills: 0      CONTINUE these medications which have NOT CHANGED   Details  aspirin EC 81 MG tablet Take 1 tablet (81 mg total) by mouth daily. Qty: 30 tablet, Refills: 0    diclofenac sodium (VOLTAREN) 1 % GEL Apply 2 g topically 2 (two) times daily as needed (pain).     esomeprazole (NEXIUM) 40 MG capsule Take 1 capsule (40 mg total) by mouth daily before breakfast. Qty: 30 capsule, Refills: 1    gabapentin (NEURONTIN) 100 MG capsule Take 300 mg by mouth 3 (three) times daily.     Insulin Detemir (LEVEMIR FLEXTOUCH) 100 UNIT/ML Pen Inject 10 Units into the skin at bedtime.    insulin lispro (HUMALOG KWIKPEN) 100 UNIT/ML KiwkPen Inject 10 Units into the skin 3 (three) times daily.    Iron-Vitamins (GERITOL COMPLETE) TABS Take 1 tablet by mouth daily.    levothyroxine (SYNTHROID, LEVOTHROID) 175 MCG tablet Take 175 mcg by mouth daily.    LORazepam (ATIVAN) 2 MG tablet Take 1 tablet (2 mg total) by mouth every 12 (twelve) hours as needed for anxiety. Qty: 60 tablet, Refills: 5    lubiprostone (AMITIZA) 8 MCG capsule Take 8 mcg by mouth 2 (two) times daily  with a meal.    meloxicam (MOBIC) 15 MG tablet Take 15 mg by mouth daily.     oxyCODONE (ROXICODONE) 15 MG immediate release tablet Take 1 tablet (15 mg total) by mouth every 6 (six) hours as needed for pain. Qty: 120 tablet, Refills: 0    rosuvastatin (CRESTOR) 20 MG tablet Take 20 mg by mouth daily.    sitaGLIPtin-metformin (JANUMET) 50-500 MG per tablet Take 1 tablet by mouth 2 (two) times daily with a meal.    Tapentadol HCl (NUCYNTA ER) 250 MG TB12 Take 250 mg by mouth every 12 (twelve) hours.    valACYclovir (VALTREX) 1000 MG tablet Take 1,000 mg by mouth 2 (two) times daily.      valsartan-hydrochlorothiazide (DIOVAN-HCT) 160-12.5 MG tablet Take 1 tablet by mouth daily.    venlafaxine XR (EFFEXOR-XR) 150 MG 24 hr capsule Take 150 mg by mouth 2 (two) times daily.    ziprasidone (GEODON) 40 MG capsule Take 40 mg by mouth daily after supper.       Allergies  Allergen Reactions  . Bee Venom Anaphylaxis  . Shellfish Allergy Anaphylaxis  . Buprenorphine Hcl Other (See Comments)    Overly sedated  . Morphine And Related Other (See Comments)    Overly sedated   Follow-up Information    Follow up with [facility].   Why:  bsc, rollator   Contact information:   [address] [place] [address] [phone]       Follow up with [facility].   Why:  HHRN, HHPT, aide   Contact information:   [place] [address] [phone]        The results of significant diagnostics from this hospitalization (including imaging, microbiology, ancillary and laboratory) are listed below for reference.    Significant Diagnostic Studies: Dg Chest 1 View  06/23/2015  CLINICAL DATA:  Patient status post fall in the bathroom. Lower back pain. Code sepsis. EXAM: CHEST 1 VIEW COMPARISON:  Chest radiograph 11/12/2014 FINDINGS: Surgical clips within the cervical soft tissue. Stable enlarged cardiac and mediastinal contours. Right mid lung heterogeneous opacities. Low lung volumes. Left basilar atelectasis. No pleural effusion or pneumothorax. IMPRESSION: Focal consolidation within the right mid lung concerning for pneumonia. Followup PA and lateral chest X-ray is recommended in 3-4 weeks following trial of antibiotic therapy to ensure resolution and exclude underlying malignancy. Electronically Signed   By: Lovey Newcomer M.D.   On: 06/23/2015 09:26   X-ray Chest Pa And Lateral  06/24/2015  CLINICAL DATA:  Sepsis.  History of asthma EXAM: CHEST  2 VIEW COMPARISON:  Jun 23, 2015 FINDINGS: There is a small area of infiltrate in  the right mid lung region. Lungs elsewhere clear. Heart size and pulmonary vascularity are normal. No adenopathy. There are surgical clips in the right cervical -thoracic junction region. IMPRESSION: Small area of infiltrate in the right mid lung region, likely pneumonia. Lungs elsewhere clear. Stable cardiac silhouette. Followup PA and lateral chest radiographs recommended in 3-4 weeks following trial of antibiotic therapy to ensure resolution and exclude underlying malignancy. Electronically Signed   By: Lowella Grip III M.D.   On: 06/24/2015 08:48   Dg Lumbar Spine Complete  06/23/2015  CLINICAL DATA:  Lumbago EXAM: LUMBAR SPINE - COMPLETE 4+ VIEW COMPARISON:  Lumbar spine series August 14, 2014 and lumbar MRI August 14, 2014 FINDINGS: Frontal, lateral, spot lumbosacral lateral, and bilateral oblique views were obtained. There are 5 non-rib-bearing  lumbar type vertebral bodies. There is no fracture or spondylolisthesis. There is mild disc space narrowing at L3-4, L4-5, and L5-S1. There is facet osteoarthritic change at L5-S1 bilaterally. IMPRESSION: Areas of osteoarthritic change, essentially stable. No fracture or spondylolisthesis. Electronically Signed   By: Lowella Grip III M.D.   On: 06/23/2015 09:26   Ct Abdomen Pelvis W Contrast  06/23/2015  CLINICAL DATA:  Patient with mid abdominal pain, nausea vomiting and diarrhea. EXAM: CT ABDOMEN AND PELVIS WITH CONTRAST TECHNIQUE: Multidetector CT imaging of the abdomen and pelvis was performed using the standard protocol following bolus administration of intravenous contrast. CONTRAST:  75cc ISOVUE-300 IOPAMIDOL (ISOVUE-300) INJECTION 61% COMPARISON:  None. FINDINGS: Lower chest: There are ground-glass and consolidative opacities within the lower lobes bilaterally. Heart is enlarged. Small hiatal hernia. Hepatobiliary: Liver is normal in size and contour. No focal lesion is identified. Patient status post cholecystectomy. Pancreas: Fatty atrophy of the  pancreatic parenchyma. Spleen: Unremarkable Adrenals/Urinary Tract: Normal adrenal glands. Kidneys enhance symmetrically with contrast. No hydronephrosis. Urinary bladder is unremarkable. Stomach/Bowel: No abnormal bowel wall thickening or evidence for bowel obstruction. No free fluid or free intraperitoneal air. Vascular/Lymphatic: Normal caliber abdominal aorta. No retroperitoneal lymphadenopathy. Other: Uterus is surgically absent. Adnexal structures are unremarkable. Musculoskeletal: Lumbar spine degenerative changes. No aggressive or acute appearing osseous lesions. IMPRESSION: Ground-glass and consolidative opacities within the lower lobes bilaterally may represent pneumonia in the appropriate clinical setting. Recommend radiographic follow-up in 3- 4 weeks to ensure resolution. No acute process within the abdomen or pelvis. Electronically Signed   By: Lovey Newcomer M.D.   On: 06/23/2015 10:14    Microbiology: Recent Results (from the past 240 hour(s))  Blood Culture (routine x 2)     Status: None (Preliminary result)   Collection Time: 06/23/15  8:20 AM  Result Value Ref Range Status   Specimen Description BLOOD LEFT ANTECUBITAL  Final   Special Requests BOTTLES DRAWN AEROBIC ONLY 10CC  Final   Culture NO GROWTH 4 DAYS  Final   Report Status PENDING  Incomplete  Blood Culture (routine x 2)     Status: None (Preliminary result)   Collection Time: 06/23/15  8:28 AM  Result Value Ref Range Status   Specimen Description BLOOD LEFT HAND  Final   Special Requests IN PEDIATRIC BOTTLE 4CC  Final   Culture NO GROWTH 4 DAYS  Final   Report Status PENDING  Incomplete  Urine culture     Status: Abnormal   Collection Time: 06/23/15 10:10 AM  Result Value Ref Range Status   Specimen Description URINE, CLEAN CATCH  Final   Special Requests NONE  Final   Culture MULTIPLE SPECIES PRESENT, SUGGEST RECOLLECTION (A)  Final   Report Status 06/24/2015 FINAL  Final  C difficile quick scan w PCR reflex      Status: None   Collection Time: 06/23/15 11:51 PM  Result Value Ref Range Status   C Diff antigen NEGATIVE NEGATIVE Final   C Diff toxin NEGATIVE NEGATIVE Final   C Diff interpretation Negative for toxigenic C. difficile  Final  MRSA PCR Screening     Status: None   Collection Time: 06/25/15 11:36 AM  Result Value Ref Range Status   MRSA by PCR NEGATIVE NEGATIVE Final    Comment:        The GeneXpert MRSA Assay (FDA approved for NASAL specimens only), is one component of a comprehensive MRSA colonization surveillance program. It is not intended to diagnose MRSA infection nor to guide  or monitor treatment for MRSA infections.      Labs: Basic Metabolic Panel:  Recent Labs Lab 06/23/15 0754 06/23/15 1333 06/24/15 0313 06/25/15 0548 06/26/15 0541  NA 128* 131* 138 137 137  K 4.3 3.9 4.0 4.7 4.1  CL 92* 96* 105 106 103  CO2 20* 21* 20* 19* 22  GLUCOSE 132* 147* 129* 98 104*  BUN 27* _0 CREATININE 1.55* 1.28* 1.10* 1.01* 1.20*  CALCIUM 9.1 8.7* 8.9 8.7* 9.2   Liver Function Tests:  Recent Labs Lab 06/23/15 0754 06/23/15 1333 06/24/15 0313  AST 39 31 22  ALT 50 45 36  ALKPHOS 79 74 74  BILITOT 0.6 0.5 0.3  PROT 6.1* 5.4* 5.5*  ALBUMIN 2.5* 2.3* 2.3*    Recent Labs Lab 06/23/15 0754  LIPASE 20   No results for input(s): AMMONIA in the last 168 hours. CBC:  Recent Labs Lab 06/24/15 0313 06/24/15 0758 06/24/15 1607 06/25/15 0548 06/26/15 0541  WBC 25.3* 22.0* 20.5* 18.9* 11.9*  HGB 9.2* 8.8* 8.4* 9.2* 9.8*  HCT 27.7* 26.9* 26.0* 28.3* 30.1*  MCV 83.2 83.0 83.9 82.3 84.1  PLT 240 252 230 243 265   Cardiac Enzymes:  Recent Labs Lab 06/24/15 0758 06/24/15 1211 06/24/15 1747  TROPONINI 0.73* 0.30* 0.05*   BNP: BNP (last 3 results)  Recent Labs  06/23/15 1333  BNP 162.2*    ProBNP (last 3 results) No results for input(s): PROBNP in the last 8760 hours.  CBG:  Recent Labs Lab 06/26/15 1300 06/26/15 1722 06/26/15 2135  06/27/15 0803 06/27/15 1143  GLUCAP 96 110* 164* 107* 119*       Signed:  Nita Sells MD   [facility] 06/27/2015, 4:35 PM

## 2015-06-27 NOTE — Care Management Important Message (Signed)
Important Message  Patient Details  Name: [name] MRN: [id number] Date of Birth: 10-18-1955   Medicare Important Message Given:  Yes    Lessa Huge Abena 06/27/2015, 1:18 PM

## 2015-06-28 ENCOUNTER — Other Ambulatory Visit: Payer: Self-pay | Admitting: *Deleted

## 2015-06-28 DIAGNOSIS — G8929 Other chronic pain: Secondary | ICD-10-CM | POA: Diagnosis not present

## 2015-06-28 DIAGNOSIS — J188 Other pneumonia, unspecified organism: Secondary | ICD-10-CM | POA: Diagnosis not present

## 2015-06-28 DIAGNOSIS — E119 Type 2 diabetes mellitus without complications: Secondary | ICD-10-CM

## 2015-06-28 DIAGNOSIS — N183 Chronic kidney disease, stage 3 (moderate): Secondary | ICD-10-CM | POA: Diagnosis not present

## 2015-06-28 DIAGNOSIS — E11649 Type 2 diabetes mellitus with hypoglycemia without coma: Secondary | ICD-10-CM | POA: Diagnosis not present

## 2015-06-28 DIAGNOSIS — I110 Hypertensive heart disease with heart failure: Secondary | ICD-10-CM | POA: Diagnosis not present

## 2015-06-28 DIAGNOSIS — I5032 Chronic diastolic (congestive) heart failure: Secondary | ICD-10-CM | POA: Diagnosis not present

## 2015-06-28 DIAGNOSIS — E1142 Type 2 diabetes mellitus with diabetic polyneuropathy: Secondary | ICD-10-CM | POA: Diagnosis not present

## 2015-06-28 DIAGNOSIS — M545 Low back pain: Secondary | ICD-10-CM | POA: Diagnosis not present

## 2015-06-28 DIAGNOSIS — E1122 Type 2 diabetes mellitus with diabetic chronic kidney disease: Secondary | ICD-10-CM | POA: Diagnosis not present

## 2015-06-28 DIAGNOSIS — G219 Secondary parkinsonism, unspecified: Secondary | ICD-10-CM | POA: Diagnosis not present

## 2015-06-28 LAB — CULTURE, BLOOD (ROUTINE X 2)
CULTURE: NO GROWTH
Culture: NO GROWTH

## 2015-06-28 NOTE — Patient Outreach (Signed)
[facility]) Care Management  06/28/2015  [name] 08-Jul-1955 [id number]   Transition of care  RN spoke with pt today and introduced the [facility] program and services. RN has a history with this pt several months ago via [facility] services. Pt remembers and discussed her current issues with limited finances and inability to prepare food choice. States her CBG have been good with AM read around 135 this morning and last A1C at 7.3. States she has not loss any weight but gained due to the "starchy" food she eats. Although pt has a Physiological scientist states she dos not prepare any food items however runs errands for simple necessities. Pt food stamps and has established SCATs for transportation resources. States she has equipment to pick up on Monday by her son for a "potty chair and rollator". States [facility] will be involved with PT and currently pending a Product/process development scientist for possible other services. RN completed the transition of care template and encouraged pt to contact her primary provider for possible post-op hospital discharge office visit and make aware of her recent hospitalization (Dr. [name]). Pt indicated she would call and update her provider. RN offered any other community resources regarding food shelters but no inquired interest. RN also offered community as pt receptive to ongoing transition of care calls and allow pt to schedule for next week's contact. Will discussed plan of care and goals based upon pt's ability to participate with managing her ongoing care. Will follow up next week with pt's progress.  Patient was recently discharged from hospital and all medications have been reviewed.  [name], RN Care Management Coordinator [facility] Office [phone]

## 2015-06-29 DIAGNOSIS — N183 Chronic kidney disease, stage 3 (moderate): Secondary | ICD-10-CM | POA: Diagnosis not present

## 2015-06-29 DIAGNOSIS — J188 Other pneumonia, unspecified organism: Secondary | ICD-10-CM | POA: Diagnosis not present

## 2015-06-29 DIAGNOSIS — G219 Secondary parkinsonism, unspecified: Secondary | ICD-10-CM | POA: Diagnosis not present

## 2015-06-29 DIAGNOSIS — E11649 Type 2 diabetes mellitus with hypoglycemia without coma: Secondary | ICD-10-CM | POA: Diagnosis not present

## 2015-06-29 DIAGNOSIS — I5032 Chronic diastolic (congestive) heart failure: Secondary | ICD-10-CM | POA: Diagnosis not present

## 2015-06-29 DIAGNOSIS — E1122 Type 2 diabetes mellitus with diabetic chronic kidney disease: Secondary | ICD-10-CM | POA: Diagnosis not present

## 2015-06-29 DIAGNOSIS — M545 Low back pain: Secondary | ICD-10-CM | POA: Diagnosis not present

## 2015-06-29 DIAGNOSIS — I110 Hypertensive heart disease with heart failure: Secondary | ICD-10-CM | POA: Diagnosis not present

## 2015-06-29 DIAGNOSIS — E1142 Type 2 diabetes mellitus with diabetic polyneuropathy: Secondary | ICD-10-CM | POA: Diagnosis not present

## 2015-06-29 DIAGNOSIS — G8929 Other chronic pain: Secondary | ICD-10-CM | POA: Diagnosis not present

## 2015-07-02 DIAGNOSIS — M545 Low back pain: Secondary | ICD-10-CM | POA: Diagnosis not present

## 2015-07-02 DIAGNOSIS — M6281 Muscle weakness (generalized): Secondary | ICD-10-CM | POA: Diagnosis not present

## 2015-07-03 DIAGNOSIS — G8929 Other chronic pain: Secondary | ICD-10-CM | POA: Diagnosis not present

## 2015-07-03 DIAGNOSIS — E11649 Type 2 diabetes mellitus with hypoglycemia without coma: Secondary | ICD-10-CM | POA: Diagnosis not present

## 2015-07-03 DIAGNOSIS — E1122 Type 2 diabetes mellitus with diabetic chronic kidney disease: Secondary | ICD-10-CM | POA: Diagnosis not present

## 2015-07-03 DIAGNOSIS — I5032 Chronic diastolic (congestive) heart failure: Secondary | ICD-10-CM | POA: Diagnosis not present

## 2015-07-03 DIAGNOSIS — J188 Other pneumonia, unspecified organism: Secondary | ICD-10-CM | POA: Diagnosis not present

## 2015-07-03 DIAGNOSIS — I110 Hypertensive heart disease with heart failure: Secondary | ICD-10-CM | POA: Diagnosis not present

## 2015-07-03 DIAGNOSIS — E1142 Type 2 diabetes mellitus with diabetic polyneuropathy: Secondary | ICD-10-CM | POA: Diagnosis not present

## 2015-07-03 DIAGNOSIS — N183 Chronic kidney disease, stage 3 (moderate): Secondary | ICD-10-CM | POA: Diagnosis not present

## 2015-07-03 DIAGNOSIS — M545 Low back pain: Secondary | ICD-10-CM | POA: Diagnosis not present

## 2015-07-03 DIAGNOSIS — G219 Secondary parkinsonism, unspecified: Secondary | ICD-10-CM | POA: Diagnosis not present

## 2015-07-04 ENCOUNTER — Encounter: Payer: Self-pay | Admitting: *Deleted

## 2015-07-04 ENCOUNTER — Other Ambulatory Visit: Payer: Self-pay | Admitting: *Deleted

## 2015-07-04 IMAGING — CR DG CHEST 1V PORT
1 series · 1 of 1 positions shown · non-contrast
Comparison: 11/13/2013 and 08/10/2013

CLINICAL DATA: Chest pain and shortness of breath.

EXAM:
PORTABLE CHEST - 1 VIEW

[AP]
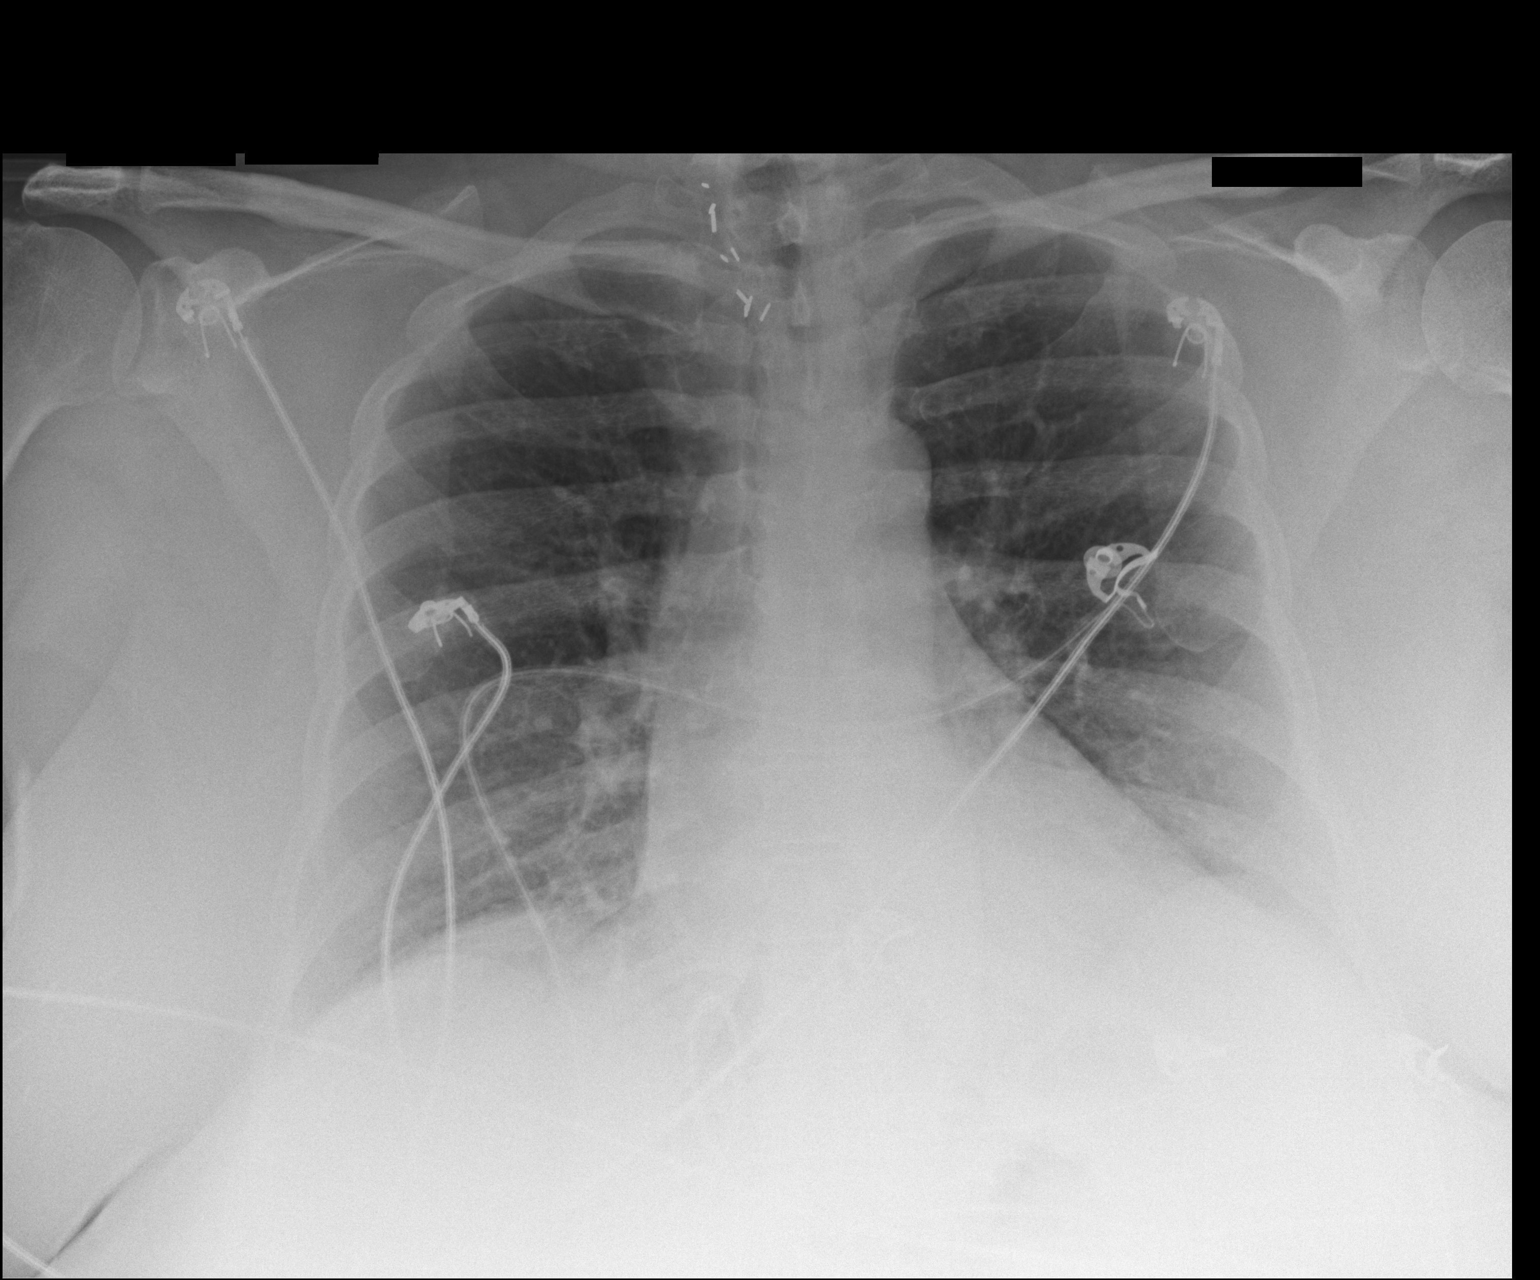

[1 of 1 positions shown; findings below may reference images not displayed]

FINDINGS: Heart size and vascularity are normal and the lungs are clear.
Surgical clips in the right peritracheal region consistent with
previous thyroid surgery.

No osseous abnormality.
IMPRESSION: No significant abnormalities.

## 2015-07-04 NOTE — Patient Outreach (Signed)
[facility]) Care Management  07/04/2015  [name] May 22, 1955 [id number]  CSW received a new referral on patient from patient's RNCM with [facility], [name] indicating that patient would benefit from social work services and resources to assist with obtaining daily meals for the next month, as patient is unable to prepare meals independently, after her recent hospitalization.  Patient is unable to stand for any length of time to prepare and cook meals.  Patient receives Physicist, medical through the [facility], as well as [employer] Youth worker) through a home health agency.  Patient realizes that she does not qualify for [employer] through [employer] of [facility]; therefore, [facility] will approve patient for Scientist, clinical (histocompatibility and immunogenetics) Transition Meals Program for 30 days, at the expense of [facility] Care Management.  CSW was able to make initial contact with patient today to perform phone assessment, as well as assess and assist with social work needs and services.  CSW introduced self, explained role and types of services provided through [facility] ([name]).  CSW further explained to patient that CSW works with patient's RNCM, also with [facility], [name]. CSW then explained the reason for the call, indicating that [name] thought that patient would benefit from social work services and resources to assist with meals.  CSW obtained two HIPAA compliant identifiers from patient, which included patient's name and date of birth. Patient is interested in the Transition Meal Program and is agreeable to allowing CSW make a referral so that the meals can be delivered beginning within the next few days.  Patient is aware that she will only receive the meals for 30 days, but believes that she will be able to prepare and cook meals  independently by then.  No additional social work needs have been identified at present so patient is aware that CSW will refrain from making additional contact.  CSW will refrain from opening patient's case, unless otherwise specified. [name], BSW, MSW, LCSW  Licensed Education officer, environmental Health System  Mailing [place] [address] [place], [place] [address] Physical Address-[address], [place],  [address] Toll Free Main # [phone] Fax # [phone] Cell # [phone]  Fax # [phone]  [name] Patient's preferred language:  [place]   English  ATTENTION:  If you speak [place], language assistance services, free of charge, are available to you.    Nondiscrimination and Accessibility Statement: Discrimination is Against the [employer], a subsidiary of [employer], complies with [employer] civil rights laws and does not discriminate on the basis of race, color, national origin, age, disability, or sex.  [facility] does not exclude people or treat them differently because of race, color, national origin, age, disability, or sex.  [facility] Providers will:  . Provide free aids and services to people with disabilities to communicate effectively with [place], such as:     ? Qualified sign language interpreters  ? Written information in other formats (large print, audio, accessible electronic formats, other formats)   . Provide free language services to people whose primary language is not [place], such as:    ? Qualified interpreters    ? Information written in other languages   If you need these services, contact your [facility] psychologist.  If you believe that a [facility]  Provider has failed to provide these services or discriminated in another way on the basis of race, color, national origin,  age, disability, or sex, you can file a grievance with: [place], [phone] or http://chapman.info/.  You can file a grievance in person or by mail, fax, or email. If you need help filing a grievance, you may contact Valrie Hart, Interim Compliance Officer, [facility] Department of Compliance and Integrity, [address], [place], [place]. [place], [phone], [name].    You can also file a civil rights complaint with the U.S. Department of Health and Financial controller, Office for [employer], electronically through the Office for Civil Rights Complaint Portal, available at OnSiteLending.nl.jsf, or by mail or phone at:  [place]. Department of Health and Human Services [address], [place] Room [phone], [facility] [place], [place]  [phone], [phone] (TDD) Complaint forms are available at CutFunds.si.

## 2015-07-05 DIAGNOSIS — G219 Secondary parkinsonism, unspecified: Secondary | ICD-10-CM | POA: Diagnosis not present

## 2015-07-05 DIAGNOSIS — J188 Other pneumonia, unspecified organism: Secondary | ICD-10-CM | POA: Diagnosis not present

## 2015-07-05 DIAGNOSIS — I5032 Chronic diastolic (congestive) heart failure: Secondary | ICD-10-CM | POA: Diagnosis not present

## 2015-07-05 DIAGNOSIS — G8929 Other chronic pain: Secondary | ICD-10-CM | POA: Diagnosis not present

## 2015-07-05 DIAGNOSIS — E1142 Type 2 diabetes mellitus with diabetic polyneuropathy: Secondary | ICD-10-CM | POA: Diagnosis not present

## 2015-07-05 DIAGNOSIS — E1122 Type 2 diabetes mellitus with diabetic chronic kidney disease: Secondary | ICD-10-CM | POA: Diagnosis not present

## 2015-07-05 DIAGNOSIS — N183 Chronic kidney disease, stage 3 (moderate): Secondary | ICD-10-CM | POA: Diagnosis not present

## 2015-07-05 DIAGNOSIS — E11649 Type 2 diabetes mellitus with hypoglycemia without coma: Secondary | ICD-10-CM | POA: Diagnosis not present

## 2015-07-05 DIAGNOSIS — M545 Low back pain: Secondary | ICD-10-CM | POA: Diagnosis not present

## 2015-07-05 DIAGNOSIS — I110 Hypertensive heart disease with heart failure: Secondary | ICD-10-CM | POA: Diagnosis not present

## 2015-07-05 NOTE — Patient Outreach (Signed)
[facility]) Care Management  07/05/2015  SHAWNEEQUA BALDRIDGE February 15, 1956 [id number]   Request received from [facility], LCSW to send referral to [employer] for patient. Referral sent and confirmation received that meals will begin delivery tomorrow, 07/06/15 for the duration of 30 days.    [name]  [facility] Care Management Assistant

## 2015-07-06 ENCOUNTER — Other Ambulatory Visit: Payer: Self-pay | Admitting: *Deleted

## 2015-07-06 NOTE — Patient Outreach (Signed)
[place] [facility]) Care Management  07/06/2015  [name] 1955-05-24 [id number]  Transition of care  RN outreach call made to pt today concerning ongoing management of care. Pt has not received the [facility] meals that were confirmed by the [facility] office last week but has been approved for the meals. RN inquired on her BS today as pt reports they are "good" with a read at 75 this morning. Pt report fall and paramedics was called to get up off the floor, reports pt. Reports only bruises to her knees and no head injuries. RN attempted to gather some initial assessment information but was not able to complete the depression screen due to a disconnect call with two attempts to reach pt once again however pt reported that she has a psychiatrist that she actively engages with for consults. RN able to leave a message request pt to call RN back to completed the assessment. Will awaiting call back if not will continue outreach call accordingly and schedule a telephone call next week.   [name], RN Care Management Coordinator [place] Office [phone]

## 2015-07-09 DIAGNOSIS — Z79899 Other long term (current) drug therapy: Secondary | ICD-10-CM | POA: Diagnosis not present

## 2015-07-09 DIAGNOSIS — M545 Low back pain: Secondary | ICD-10-CM | POA: Diagnosis not present

## 2015-07-09 DIAGNOSIS — M47817 Spondylosis without myelopathy or radiculopathy, lumbosacral region: Secondary | ICD-10-CM | POA: Diagnosis not present

## 2015-07-09 DIAGNOSIS — I5032 Chronic diastolic (congestive) heart failure: Secondary | ICD-10-CM | POA: Diagnosis not present

## 2015-07-09 DIAGNOSIS — E1142 Type 2 diabetes mellitus with diabetic polyneuropathy: Secondary | ICD-10-CM | POA: Diagnosis not present

## 2015-07-09 DIAGNOSIS — G894 Chronic pain syndrome: Secondary | ICD-10-CM | POA: Diagnosis not present

## 2015-07-09 DIAGNOSIS — J188 Other pneumonia, unspecified organism: Secondary | ICD-10-CM | POA: Diagnosis not present

## 2015-07-09 DIAGNOSIS — E11649 Type 2 diabetes mellitus with hypoglycemia without coma: Secondary | ICD-10-CM | POA: Diagnosis not present

## 2015-07-09 DIAGNOSIS — I110 Hypertensive heart disease with heart failure: Secondary | ICD-10-CM | POA: Diagnosis not present

## 2015-07-09 DIAGNOSIS — M79606 Pain in leg, unspecified: Secondary | ICD-10-CM | POA: Diagnosis not present

## 2015-07-09 DIAGNOSIS — M5137 Other intervertebral disc degeneration, lumbosacral region: Secondary | ICD-10-CM | POA: Diagnosis not present

## 2015-07-09 DIAGNOSIS — Z79891 Long term (current) use of opiate analgesic: Secondary | ICD-10-CM | POA: Diagnosis not present

## 2015-07-09 DIAGNOSIS — N183 Chronic kidney disease, stage 3 (moderate): Secondary | ICD-10-CM | POA: Diagnosis not present

## 2015-07-09 DIAGNOSIS — E1122 Type 2 diabetes mellitus with diabetic chronic kidney disease: Secondary | ICD-10-CM | POA: Diagnosis not present

## 2015-07-09 DIAGNOSIS — G8929 Other chronic pain: Secondary | ICD-10-CM | POA: Diagnosis not present

## 2015-07-09 DIAGNOSIS — G219 Secondary parkinsonism, unspecified: Secondary | ICD-10-CM | POA: Diagnosis not present

## 2015-07-11 DIAGNOSIS — J188 Other pneumonia, unspecified organism: Secondary | ICD-10-CM | POA: Diagnosis not present

## 2015-07-11 DIAGNOSIS — E11649 Type 2 diabetes mellitus with hypoglycemia without coma: Secondary | ICD-10-CM | POA: Diagnosis not present

## 2015-07-11 DIAGNOSIS — G8929 Other chronic pain: Secondary | ICD-10-CM | POA: Diagnosis not present

## 2015-07-11 DIAGNOSIS — I110 Hypertensive heart disease with heart failure: Secondary | ICD-10-CM | POA: Diagnosis not present

## 2015-07-11 DIAGNOSIS — G219 Secondary parkinsonism, unspecified: Secondary | ICD-10-CM | POA: Diagnosis not present

## 2015-07-11 DIAGNOSIS — E1122 Type 2 diabetes mellitus with diabetic chronic kidney disease: Secondary | ICD-10-CM | POA: Diagnosis not present

## 2015-07-11 DIAGNOSIS — N183 Chronic kidney disease, stage 3 (moderate): Secondary | ICD-10-CM | POA: Diagnosis not present

## 2015-07-11 DIAGNOSIS — M545 Low back pain: Secondary | ICD-10-CM | POA: Diagnosis not present

## 2015-07-11 DIAGNOSIS — I5032 Chronic diastolic (congestive) heart failure: Secondary | ICD-10-CM | POA: Diagnosis not present

## 2015-07-11 DIAGNOSIS — E1142 Type 2 diabetes mellitus with diabetic polyneuropathy: Secondary | ICD-10-CM | POA: Diagnosis not present

## 2015-07-12 DIAGNOSIS — J189 Pneumonia, unspecified organism: Secondary | ICD-10-CM | POA: Diagnosis not present

## 2015-07-12 DIAGNOSIS — E039 Hypothyroidism, unspecified: Secondary | ICD-10-CM | POA: Diagnosis not present

## 2015-07-12 DIAGNOSIS — K120 Recurrent oral aphthae: Secondary | ICD-10-CM | POA: Diagnosis not present

## 2015-07-13 ENCOUNTER — Other Ambulatory Visit: Payer: Self-pay | Admitting: *Deleted

## 2015-07-13 DIAGNOSIS — E1122 Type 2 diabetes mellitus with diabetic chronic kidney disease: Secondary | ICD-10-CM | POA: Diagnosis not present

## 2015-07-13 DIAGNOSIS — G219 Secondary parkinsonism, unspecified: Secondary | ICD-10-CM | POA: Diagnosis not present

## 2015-07-13 DIAGNOSIS — E1142 Type 2 diabetes mellitus with diabetic polyneuropathy: Secondary | ICD-10-CM | POA: Diagnosis not present

## 2015-07-13 DIAGNOSIS — N183 Chronic kidney disease, stage 3 (moderate): Secondary | ICD-10-CM | POA: Diagnosis not present

## 2015-07-13 DIAGNOSIS — E11649 Type 2 diabetes mellitus with hypoglycemia without coma: Secondary | ICD-10-CM | POA: Diagnosis not present

## 2015-07-13 DIAGNOSIS — G8929 Other chronic pain: Secondary | ICD-10-CM | POA: Diagnosis not present

## 2015-07-13 DIAGNOSIS — I5032 Chronic diastolic (congestive) heart failure: Secondary | ICD-10-CM | POA: Diagnosis not present

## 2015-07-13 DIAGNOSIS — J188 Other pneumonia, unspecified organism: Secondary | ICD-10-CM | POA: Diagnosis not present

## 2015-07-13 DIAGNOSIS — I110 Hypertensive heart disease with heart failure: Secondary | ICD-10-CM | POA: Diagnosis not present

## 2015-07-13 DIAGNOSIS — M545 Low back pain: Secondary | ICD-10-CM | POA: Diagnosis not present

## 2015-07-13 NOTE — Patient Outreach (Signed)
[place] [facility]) Care Management  07/13/2015  DESTINEY SANABIA 1955/09/06 [id number]  Transition of care  RN outreached to pt today with ongoing transition of care call however pt not available and RN unable to leave a message. Will continue outreach calls accordingly and inquires on recent referral for meals assistance via [facility] and services.  [name], RN Care Management Coordinator [place] Office [phone]

## 2015-07-17 DIAGNOSIS — G8929 Other chronic pain: Secondary | ICD-10-CM | POA: Diagnosis not present

## 2015-07-17 DIAGNOSIS — N183 Chronic kidney disease, stage 3 (moderate): Secondary | ICD-10-CM | POA: Diagnosis not present

## 2015-07-17 DIAGNOSIS — J188 Other pneumonia, unspecified organism: Secondary | ICD-10-CM | POA: Diagnosis not present

## 2015-07-17 DIAGNOSIS — E1142 Type 2 diabetes mellitus with diabetic polyneuropathy: Secondary | ICD-10-CM | POA: Diagnosis not present

## 2015-07-17 DIAGNOSIS — E11649 Type 2 diabetes mellitus with hypoglycemia without coma: Secondary | ICD-10-CM | POA: Diagnosis not present

## 2015-07-17 DIAGNOSIS — M545 Low back pain: Secondary | ICD-10-CM | POA: Diagnosis not present

## 2015-07-17 DIAGNOSIS — E1122 Type 2 diabetes mellitus with diabetic chronic kidney disease: Secondary | ICD-10-CM | POA: Diagnosis not present

## 2015-07-17 DIAGNOSIS — I5032 Chronic diastolic (congestive) heart failure: Secondary | ICD-10-CM | POA: Diagnosis not present

## 2015-07-17 DIAGNOSIS — G219 Secondary parkinsonism, unspecified: Secondary | ICD-10-CM | POA: Diagnosis not present

## 2015-07-17 DIAGNOSIS — I110 Hypertensive heart disease with heart failure: Secondary | ICD-10-CM | POA: Diagnosis not present

## 2015-07-18 DIAGNOSIS — E1142 Type 2 diabetes mellitus with diabetic polyneuropathy: Secondary | ICD-10-CM | POA: Diagnosis not present

## 2015-07-18 DIAGNOSIS — G219 Secondary parkinsonism, unspecified: Secondary | ICD-10-CM | POA: Diagnosis not present

## 2015-07-18 DIAGNOSIS — I5032 Chronic diastolic (congestive) heart failure: Secondary | ICD-10-CM | POA: Diagnosis not present

## 2015-07-18 DIAGNOSIS — M545 Low back pain: Secondary | ICD-10-CM | POA: Diagnosis not present

## 2015-07-18 DIAGNOSIS — J188 Other pneumonia, unspecified organism: Secondary | ICD-10-CM | POA: Diagnosis not present

## 2015-07-18 DIAGNOSIS — N183 Chronic kidney disease, stage 3 (moderate): Secondary | ICD-10-CM | POA: Diagnosis not present

## 2015-07-18 DIAGNOSIS — E1122 Type 2 diabetes mellitus with diabetic chronic kidney disease: Secondary | ICD-10-CM | POA: Diagnosis not present

## 2015-07-18 DIAGNOSIS — E11649 Type 2 diabetes mellitus with hypoglycemia without coma: Secondary | ICD-10-CM | POA: Diagnosis not present

## 2015-07-18 DIAGNOSIS — I110 Hypertensive heart disease with heart failure: Secondary | ICD-10-CM | POA: Diagnosis not present

## 2015-07-18 DIAGNOSIS — G8929 Other chronic pain: Secondary | ICD-10-CM | POA: Diagnosis not present

## 2015-07-20 ENCOUNTER — Other Ambulatory Visit: Payer: Self-pay | Admitting: *Deleted

## 2015-07-20 DIAGNOSIS — E1142 Type 2 diabetes mellitus with diabetic polyneuropathy: Secondary | ICD-10-CM | POA: Diagnosis not present

## 2015-07-20 DIAGNOSIS — I5032 Chronic diastolic (congestive) heart failure: Secondary | ICD-10-CM | POA: Diagnosis not present

## 2015-07-20 DIAGNOSIS — G219 Secondary parkinsonism, unspecified: Secondary | ICD-10-CM | POA: Diagnosis not present

## 2015-07-20 DIAGNOSIS — I110 Hypertensive heart disease with heart failure: Secondary | ICD-10-CM | POA: Diagnosis not present

## 2015-07-20 DIAGNOSIS — M545 Low back pain: Secondary | ICD-10-CM | POA: Diagnosis not present

## 2015-07-20 DIAGNOSIS — J188 Other pneumonia, unspecified organism: Secondary | ICD-10-CM | POA: Diagnosis not present

## 2015-07-20 DIAGNOSIS — E1122 Type 2 diabetes mellitus with diabetic chronic kidney disease: Secondary | ICD-10-CM | POA: Diagnosis not present

## 2015-07-20 DIAGNOSIS — G8929 Other chronic pain: Secondary | ICD-10-CM | POA: Diagnosis not present

## 2015-07-20 DIAGNOSIS — E11649 Type 2 diabetes mellitus with hypoglycemia without coma: Secondary | ICD-10-CM | POA: Diagnosis not present

## 2015-07-20 DIAGNOSIS — N183 Chronic kidney disease, stage 3 (moderate): Secondary | ICD-10-CM | POA: Diagnosis not present

## 2015-07-20 NOTE — Patient Outreach (Signed)
[place] [facility]) Care Management  07/20/2015  [name] 1955-10-16 [id number]   Patient is confirmed with [employer] to begin 30 day [employer] delivery; they will call patient to confirm delivery.

## 2015-07-20 NOTE — Patient Outreach (Signed)
[place] [facility]) Care Management  07/20/2015  SARRAH FIORENZA 21-Sep-1955 [id number]   Transition of care  RN spoke with pt today and verified pt continues to attend all medical appointments and completed her antibiotics and continues adherence with all her other medications. Pt reports she did not get the order Mobile Meals via [facility] delivery arranged by this RN earlier in May however pt continues with her food stamps and other means for her meals.  RN will contact office and inquire further. Pt reports falls with assistance from the Paramedics to assist in getting pt up and confirms her provider is aware. Pt states she will follow up with her orthopedic provider for other options and interventions. RN will continue to strongly encouraged adherence with the plan of care and goals reviewed today and continue transition of care contacts accordingly.  [name], RN Care Management Coordinator [place] Office [phone]

## 2015-07-23 DIAGNOSIS — M545 Low back pain: Secondary | ICD-10-CM | POA: Diagnosis not present

## 2015-07-23 DIAGNOSIS — I110 Hypertensive heart disease with heart failure: Secondary | ICD-10-CM | POA: Diagnosis not present

## 2015-07-23 DIAGNOSIS — G8929 Other chronic pain: Secondary | ICD-10-CM | POA: Diagnosis not present

## 2015-07-23 DIAGNOSIS — N183 Chronic kidney disease, stage 3 (moderate): Secondary | ICD-10-CM | POA: Diagnosis not present

## 2015-07-23 DIAGNOSIS — E11649 Type 2 diabetes mellitus with hypoglycemia without coma: Secondary | ICD-10-CM | POA: Diagnosis not present

## 2015-07-23 DIAGNOSIS — G219 Secondary parkinsonism, unspecified: Secondary | ICD-10-CM | POA: Diagnosis not present

## 2015-07-23 DIAGNOSIS — E1142 Type 2 diabetes mellitus with diabetic polyneuropathy: Secondary | ICD-10-CM | POA: Diagnosis not present

## 2015-07-23 DIAGNOSIS — I5032 Chronic diastolic (congestive) heart failure: Secondary | ICD-10-CM | POA: Diagnosis not present

## 2015-07-23 DIAGNOSIS — J188 Other pneumonia, unspecified organism: Secondary | ICD-10-CM | POA: Diagnosis not present

## 2015-07-23 DIAGNOSIS — E1122 Type 2 diabetes mellitus with diabetic chronic kidney disease: Secondary | ICD-10-CM | POA: Diagnosis not present

## 2015-07-24 DIAGNOSIS — E1122 Type 2 diabetes mellitus with diabetic chronic kidney disease: Secondary | ICD-10-CM | POA: Diagnosis not present

## 2015-07-24 DIAGNOSIS — G8929 Other chronic pain: Secondary | ICD-10-CM | POA: Diagnosis not present

## 2015-07-24 DIAGNOSIS — N183 Chronic kidney disease, stage 3 (moderate): Secondary | ICD-10-CM | POA: Diagnosis not present

## 2015-07-24 DIAGNOSIS — J188 Other pneumonia, unspecified organism: Secondary | ICD-10-CM | POA: Diagnosis not present

## 2015-07-24 DIAGNOSIS — M545 Low back pain: Secondary | ICD-10-CM | POA: Diagnosis not present

## 2015-07-24 DIAGNOSIS — E11649 Type 2 diabetes mellitus with hypoglycemia without coma: Secondary | ICD-10-CM | POA: Diagnosis not present

## 2015-07-24 DIAGNOSIS — G219 Secondary parkinsonism, unspecified: Secondary | ICD-10-CM | POA: Diagnosis not present

## 2015-07-24 DIAGNOSIS — I5032 Chronic diastolic (congestive) heart failure: Secondary | ICD-10-CM | POA: Diagnosis not present

## 2015-07-24 DIAGNOSIS — E1142 Type 2 diabetes mellitus with diabetic polyneuropathy: Secondary | ICD-10-CM | POA: Diagnosis not present

## 2015-07-24 DIAGNOSIS — I110 Hypertensive heart disease with heart failure: Secondary | ICD-10-CM | POA: Diagnosis not present

## 2015-07-26 DIAGNOSIS — E1122 Type 2 diabetes mellitus with diabetic chronic kidney disease: Secondary | ICD-10-CM | POA: Diagnosis not present

## 2015-07-26 DIAGNOSIS — G219 Secondary parkinsonism, unspecified: Secondary | ICD-10-CM | POA: Diagnosis not present

## 2015-07-26 DIAGNOSIS — J188 Other pneumonia, unspecified organism: Secondary | ICD-10-CM | POA: Diagnosis not present

## 2015-07-26 DIAGNOSIS — E11649 Type 2 diabetes mellitus with hypoglycemia without coma: Secondary | ICD-10-CM | POA: Diagnosis not present

## 2015-07-26 DIAGNOSIS — M545 Low back pain: Secondary | ICD-10-CM | POA: Diagnosis not present

## 2015-07-26 DIAGNOSIS — I5032 Chronic diastolic (congestive) heart failure: Secondary | ICD-10-CM | POA: Diagnosis not present

## 2015-07-26 DIAGNOSIS — N183 Chronic kidney disease, stage 3 (moderate): Secondary | ICD-10-CM | POA: Diagnosis not present

## 2015-07-26 DIAGNOSIS — I110 Hypertensive heart disease with heart failure: Secondary | ICD-10-CM | POA: Diagnosis not present

## 2015-07-26 DIAGNOSIS — G8929 Other chronic pain: Secondary | ICD-10-CM | POA: Diagnosis not present

## 2015-07-26 DIAGNOSIS — E1142 Type 2 diabetes mellitus with diabetic polyneuropathy: Secondary | ICD-10-CM | POA: Diagnosis not present

## 2015-07-27 ENCOUNTER — Other Ambulatory Visit: Payer: Self-pay | Admitting: *Deleted

## 2015-07-27 NOTE — Patient Outreach (Signed)
[place] [facility]) Care Management  07/27/2015  [name] 01-28-1956 [id number]   Transition of care  RN attempted outreach however unsuccessful. RN able to leave a HIPAA approved voice message requesting a call back. Will verify pt progress and nutritional meals delivered as referral last month. Will reschedule ongoing transition of care call for next week.  [name], RN Care Management Coordinator [place] Office [phone]

## 2015-07-30 DIAGNOSIS — J188 Other pneumonia, unspecified organism: Secondary | ICD-10-CM | POA: Diagnosis not present

## 2015-07-30 DIAGNOSIS — N183 Chronic kidney disease, stage 3 (moderate): Secondary | ICD-10-CM | POA: Diagnosis not present

## 2015-07-30 DIAGNOSIS — M545 Low back pain: Secondary | ICD-10-CM | POA: Diagnosis not present

## 2015-07-30 DIAGNOSIS — G219 Secondary parkinsonism, unspecified: Secondary | ICD-10-CM | POA: Diagnosis not present

## 2015-07-30 DIAGNOSIS — E1142 Type 2 diabetes mellitus with diabetic polyneuropathy: Secondary | ICD-10-CM | POA: Diagnosis not present

## 2015-07-30 DIAGNOSIS — E1122 Type 2 diabetes mellitus with diabetic chronic kidney disease: Secondary | ICD-10-CM | POA: Diagnosis not present

## 2015-07-30 DIAGNOSIS — I5032 Chronic diastolic (congestive) heart failure: Secondary | ICD-10-CM | POA: Diagnosis not present

## 2015-07-30 DIAGNOSIS — I110 Hypertensive heart disease with heart failure: Secondary | ICD-10-CM | POA: Diagnosis not present

## 2015-07-30 DIAGNOSIS — G8929 Other chronic pain: Secondary | ICD-10-CM | POA: Diagnosis not present

## 2015-07-30 DIAGNOSIS — E11649 Type 2 diabetes mellitus with hypoglycemia without coma: Secondary | ICD-10-CM | POA: Diagnosis not present

## 2015-08-01 ENCOUNTER — Other Ambulatory Visit: Payer: Self-pay | Admitting: *Deleted

## 2015-08-01 DIAGNOSIS — J188 Other pneumonia, unspecified organism: Secondary | ICD-10-CM | POA: Diagnosis not present

## 2015-08-01 DIAGNOSIS — I110 Hypertensive heart disease with heart failure: Secondary | ICD-10-CM | POA: Diagnosis not present

## 2015-08-01 DIAGNOSIS — M545 Low back pain: Secondary | ICD-10-CM | POA: Diagnosis not present

## 2015-08-01 DIAGNOSIS — N183 Chronic kidney disease, stage 3 (moderate): Secondary | ICD-10-CM | POA: Diagnosis not present

## 2015-08-01 DIAGNOSIS — I5032 Chronic diastolic (congestive) heart failure: Secondary | ICD-10-CM | POA: Diagnosis not present

## 2015-08-01 DIAGNOSIS — G8929 Other chronic pain: Secondary | ICD-10-CM | POA: Diagnosis not present

## 2015-08-01 DIAGNOSIS — E1142 Type 2 diabetes mellitus with diabetic polyneuropathy: Secondary | ICD-10-CM | POA: Diagnosis not present

## 2015-08-01 DIAGNOSIS — G219 Secondary parkinsonism, unspecified: Secondary | ICD-10-CM | POA: Diagnosis not present

## 2015-08-01 DIAGNOSIS — E11649 Type 2 diabetes mellitus with hypoglycemia without coma: Secondary | ICD-10-CM | POA: Diagnosis not present

## 2015-08-01 DIAGNOSIS — E1122 Type 2 diabetes mellitus with diabetic chronic kidney disease: Secondary | ICD-10-CM | POA: Diagnosis not present

## 2015-08-01 NOTE — Patient Outreach (Signed)
[place] [facility]) Care Management  08/01/2015  [name] 21-Aug-1955 [id number]   RN attempt to contact this pt however unsuccessful. RN able to leave a HIPAA approved voice message requesting a call back. Will further inquired on pt's ongoing recover and continues to offer [facility] assistance. Note pt was awaiting mobile meals via [facility] supplies on last conversation. Will follow up accordingly.  [name], RN Care Management Coordinator [place] Office [phone]

## 2015-08-02 ENCOUNTER — Encounter: Payer: Self-pay | Admitting: *Deleted

## 2015-08-02 ENCOUNTER — Other Ambulatory Visit: Payer: Self-pay | Admitting: *Deleted

## 2015-08-02 NOTE — Patient Outreach (Signed)
[place] [facility]) Care Management  08/02/2015  [name] [birth year] [id number]   Third unsuccessful attempt to reach pt. RN able to leave a HIPAA approved voice message requesting a call back. Will sent outreach letter and await a response.   [name], RN Care Management Coordinator [place] Office [phone]

## 2015-08-03 DIAGNOSIS — E1122 Type 2 diabetes mellitus with diabetic chronic kidney disease: Secondary | ICD-10-CM | POA: Diagnosis not present

## 2015-08-03 DIAGNOSIS — E1142 Type 2 diabetes mellitus with diabetic polyneuropathy: Secondary | ICD-10-CM | POA: Diagnosis not present

## 2015-08-03 DIAGNOSIS — I5032 Chronic diastolic (congestive) heart failure: Secondary | ICD-10-CM | POA: Diagnosis not present

## 2015-08-03 DIAGNOSIS — N183 Chronic kidney disease, stage 3 (moderate): Secondary | ICD-10-CM | POA: Diagnosis not present

## 2015-08-03 DIAGNOSIS — G8929 Other chronic pain: Secondary | ICD-10-CM | POA: Diagnosis not present

## 2015-08-03 DIAGNOSIS — J188 Other pneumonia, unspecified organism: Secondary | ICD-10-CM | POA: Diagnosis not present

## 2015-08-03 DIAGNOSIS — M545 Low back pain: Secondary | ICD-10-CM | POA: Diagnosis not present

## 2015-08-03 DIAGNOSIS — G219 Secondary parkinsonism, unspecified: Secondary | ICD-10-CM | POA: Diagnosis not present

## 2015-08-03 DIAGNOSIS — I110 Hypertensive heart disease with heart failure: Secondary | ICD-10-CM | POA: Diagnosis not present

## 2015-08-03 DIAGNOSIS — E11649 Type 2 diabetes mellitus with hypoglycemia without coma: Secondary | ICD-10-CM | POA: Diagnosis not present

## 2015-08-06 DIAGNOSIS — M545 Low back pain: Secondary | ICD-10-CM | POA: Diagnosis not present

## 2015-08-06 DIAGNOSIS — G8929 Other chronic pain: Secondary | ICD-10-CM | POA: Diagnosis not present

## 2015-08-06 DIAGNOSIS — J188 Other pneumonia, unspecified organism: Secondary | ICD-10-CM | POA: Diagnosis not present

## 2015-08-06 DIAGNOSIS — I5032 Chronic diastolic (congestive) heart failure: Secondary | ICD-10-CM | POA: Diagnosis not present

## 2015-08-06 DIAGNOSIS — G219 Secondary parkinsonism, unspecified: Secondary | ICD-10-CM | POA: Diagnosis not present

## 2015-08-06 DIAGNOSIS — E1142 Type 2 diabetes mellitus with diabetic polyneuropathy: Secondary | ICD-10-CM | POA: Diagnosis not present

## 2015-08-06 DIAGNOSIS — E11649 Type 2 diabetes mellitus with hypoglycemia without coma: Secondary | ICD-10-CM | POA: Diagnosis not present

## 2015-08-06 DIAGNOSIS — N183 Chronic kidney disease, stage 3 (moderate): Secondary | ICD-10-CM | POA: Diagnosis not present

## 2015-08-06 DIAGNOSIS — I110 Hypertensive heart disease with heart failure: Secondary | ICD-10-CM | POA: Diagnosis not present

## 2015-08-06 DIAGNOSIS — E1122 Type 2 diabetes mellitus with diabetic chronic kidney disease: Secondary | ICD-10-CM | POA: Diagnosis not present

## 2015-08-08 DIAGNOSIS — Z79899 Other long term (current) drug therapy: Secondary | ICD-10-CM | POA: Diagnosis not present

## 2015-08-08 DIAGNOSIS — M79606 Pain in leg, unspecified: Secondary | ICD-10-CM | POA: Diagnosis not present

## 2015-08-08 DIAGNOSIS — G894 Chronic pain syndrome: Secondary | ICD-10-CM | POA: Diagnosis not present

## 2015-08-08 DIAGNOSIS — M47817 Spondylosis without myelopathy or radiculopathy, lumbosacral region: Secondary | ICD-10-CM | POA: Diagnosis not present

## 2015-08-08 DIAGNOSIS — M5137 Other intervertebral disc degeneration, lumbosacral region: Secondary | ICD-10-CM | POA: Diagnosis not present

## 2015-08-08 DIAGNOSIS — M549 Dorsalgia, unspecified: Secondary | ICD-10-CM | POA: Diagnosis not present

## 2015-08-08 DIAGNOSIS — M545 Low back pain: Secondary | ICD-10-CM | POA: Diagnosis not present

## 2015-08-08 DIAGNOSIS — Z79891 Long term (current) use of opiate analgesic: Secondary | ICD-10-CM | POA: Diagnosis not present

## 2015-08-10 DIAGNOSIS — I110 Hypertensive heart disease with heart failure: Secondary | ICD-10-CM | POA: Diagnosis not present

## 2015-08-10 DIAGNOSIS — I5032 Chronic diastolic (congestive) heart failure: Secondary | ICD-10-CM | POA: Diagnosis not present

## 2015-08-10 DIAGNOSIS — E1142 Type 2 diabetes mellitus with diabetic polyneuropathy: Secondary | ICD-10-CM | POA: Diagnosis not present

## 2015-08-10 DIAGNOSIS — E11649 Type 2 diabetes mellitus with hypoglycemia without coma: Secondary | ICD-10-CM | POA: Diagnosis not present

## 2015-08-10 DIAGNOSIS — E1122 Type 2 diabetes mellitus with diabetic chronic kidney disease: Secondary | ICD-10-CM | POA: Diagnosis not present

## 2015-08-10 DIAGNOSIS — G8929 Other chronic pain: Secondary | ICD-10-CM | POA: Diagnosis not present

## 2015-08-10 DIAGNOSIS — G219 Secondary parkinsonism, unspecified: Secondary | ICD-10-CM | POA: Diagnosis not present

## 2015-08-10 DIAGNOSIS — J188 Other pneumonia, unspecified organism: Secondary | ICD-10-CM | POA: Diagnosis not present

## 2015-08-10 DIAGNOSIS — M545 Low back pain: Secondary | ICD-10-CM | POA: Diagnosis not present

## 2015-08-10 DIAGNOSIS — N183 Chronic kidney disease, stage 3 (moderate): Secondary | ICD-10-CM | POA: Diagnosis not present

## 2015-08-13 DIAGNOSIS — N183 Chronic kidney disease, stage 3 (moderate): Secondary | ICD-10-CM | POA: Diagnosis not present

## 2015-08-13 DIAGNOSIS — E1142 Type 2 diabetes mellitus with diabetic polyneuropathy: Secondary | ICD-10-CM | POA: Diagnosis not present

## 2015-08-13 DIAGNOSIS — E11649 Type 2 diabetes mellitus with hypoglycemia without coma: Secondary | ICD-10-CM | POA: Diagnosis not present

## 2015-08-13 DIAGNOSIS — G219 Secondary parkinsonism, unspecified: Secondary | ICD-10-CM | POA: Diagnosis not present

## 2015-08-13 DIAGNOSIS — M545 Low back pain: Secondary | ICD-10-CM | POA: Diagnosis not present

## 2015-08-13 DIAGNOSIS — G8929 Other chronic pain: Secondary | ICD-10-CM | POA: Diagnosis not present

## 2015-08-13 DIAGNOSIS — J188 Other pneumonia, unspecified organism: Secondary | ICD-10-CM | POA: Diagnosis not present

## 2015-08-13 DIAGNOSIS — I5032 Chronic diastolic (congestive) heart failure: Secondary | ICD-10-CM | POA: Diagnosis not present

## 2015-08-13 DIAGNOSIS — I110 Hypertensive heart disease with heart failure: Secondary | ICD-10-CM | POA: Diagnosis not present

## 2015-08-13 DIAGNOSIS — E1122 Type 2 diabetes mellitus with diabetic chronic kidney disease: Secondary | ICD-10-CM | POA: Diagnosis not present

## 2015-08-16 DIAGNOSIS — M545 Low back pain: Secondary | ICD-10-CM | POA: Diagnosis not present

## 2015-08-16 DIAGNOSIS — I5032 Chronic diastolic (congestive) heart failure: Secondary | ICD-10-CM | POA: Diagnosis not present

## 2015-08-16 DIAGNOSIS — I110 Hypertensive heart disease with heart failure: Secondary | ICD-10-CM | POA: Diagnosis not present

## 2015-08-16 DIAGNOSIS — E1122 Type 2 diabetes mellitus with diabetic chronic kidney disease: Secondary | ICD-10-CM | POA: Diagnosis not present

## 2015-08-16 DIAGNOSIS — G8929 Other chronic pain: Secondary | ICD-10-CM | POA: Diagnosis not present

## 2015-08-16 DIAGNOSIS — E11649 Type 2 diabetes mellitus with hypoglycemia without coma: Secondary | ICD-10-CM | POA: Diagnosis not present

## 2015-08-16 DIAGNOSIS — N183 Chronic kidney disease, stage 3 (moderate): Secondary | ICD-10-CM | POA: Diagnosis not present

## 2015-08-16 DIAGNOSIS — G219 Secondary parkinsonism, unspecified: Secondary | ICD-10-CM | POA: Diagnosis not present

## 2015-08-16 DIAGNOSIS — J188 Other pneumonia, unspecified organism: Secondary | ICD-10-CM | POA: Diagnosis not present

## 2015-08-16 DIAGNOSIS — E1142 Type 2 diabetes mellitus with diabetic polyneuropathy: Secondary | ICD-10-CM | POA: Diagnosis not present

## 2015-08-17 ENCOUNTER — Encounter: Payer: Self-pay | Admitting: *Deleted

## 2015-08-17 ENCOUNTER — Other Ambulatory Visit: Payer: Self-pay | Admitting: *Deleted

## 2015-08-17 NOTE — Patient Outreach (Signed)
[place] [facility]) Care Management  08/17/2015  [name] [birth year] [id number]   Several attempts to reach this pt however unsuccessful. No response to outreach letter. Will perform case closure and notify pt's provider and [facility] CMA.  [name], RN Care Management Coordinator [place] Office [phone]

## 2015-08-20 DIAGNOSIS — E11649 Type 2 diabetes mellitus with hypoglycemia without coma: Secondary | ICD-10-CM | POA: Diagnosis not present

## 2015-08-20 DIAGNOSIS — N183 Chronic kidney disease, stage 3 (moderate): Secondary | ICD-10-CM | POA: Diagnosis not present

## 2015-08-20 DIAGNOSIS — E1142 Type 2 diabetes mellitus with diabetic polyneuropathy: Secondary | ICD-10-CM | POA: Diagnosis not present

## 2015-08-20 DIAGNOSIS — I5032 Chronic diastolic (congestive) heart failure: Secondary | ICD-10-CM | POA: Diagnosis not present

## 2015-08-20 DIAGNOSIS — G8929 Other chronic pain: Secondary | ICD-10-CM | POA: Diagnosis not present

## 2015-08-20 DIAGNOSIS — J188 Other pneumonia, unspecified organism: Secondary | ICD-10-CM | POA: Diagnosis not present

## 2015-08-20 DIAGNOSIS — I110 Hypertensive heart disease with heart failure: Secondary | ICD-10-CM | POA: Diagnosis not present

## 2015-08-20 DIAGNOSIS — G219 Secondary parkinsonism, unspecified: Secondary | ICD-10-CM | POA: Diagnosis not present

## 2015-08-20 DIAGNOSIS — M545 Low back pain: Secondary | ICD-10-CM | POA: Diagnosis not present

## 2015-08-20 DIAGNOSIS — E1122 Type 2 diabetes mellitus with diabetic chronic kidney disease: Secondary | ICD-10-CM | POA: Diagnosis not present

## 2015-08-22 DIAGNOSIS — M545 Low back pain: Secondary | ICD-10-CM | POA: Diagnosis not present

## 2015-08-22 DIAGNOSIS — E11649 Type 2 diabetes mellitus with hypoglycemia without coma: Secondary | ICD-10-CM | POA: Diagnosis not present

## 2015-08-22 DIAGNOSIS — I5032 Chronic diastolic (congestive) heart failure: Secondary | ICD-10-CM | POA: Diagnosis not present

## 2015-08-22 DIAGNOSIS — E1142 Type 2 diabetes mellitus with diabetic polyneuropathy: Secondary | ICD-10-CM | POA: Diagnosis not present

## 2015-08-22 DIAGNOSIS — E1122 Type 2 diabetes mellitus with diabetic chronic kidney disease: Secondary | ICD-10-CM | POA: Diagnosis not present

## 2015-08-22 DIAGNOSIS — G8929 Other chronic pain: Secondary | ICD-10-CM | POA: Diagnosis not present

## 2015-08-22 DIAGNOSIS — G219 Secondary parkinsonism, unspecified: Secondary | ICD-10-CM | POA: Diagnosis not present

## 2015-08-22 DIAGNOSIS — I110 Hypertensive heart disease with heart failure: Secondary | ICD-10-CM | POA: Diagnosis not present

## 2015-08-22 DIAGNOSIS — J188 Other pneumonia, unspecified organism: Secondary | ICD-10-CM | POA: Diagnosis not present

## 2015-08-22 DIAGNOSIS — N183 Chronic kidney disease, stage 3 (moderate): Secondary | ICD-10-CM | POA: Diagnosis not present

## 2015-08-24 DIAGNOSIS — E1122 Type 2 diabetes mellitus with diabetic chronic kidney disease: Secondary | ICD-10-CM | POA: Diagnosis not present

## 2015-08-24 DIAGNOSIS — G8929 Other chronic pain: Secondary | ICD-10-CM | POA: Diagnosis not present

## 2015-08-24 DIAGNOSIS — I5032 Chronic diastolic (congestive) heart failure: Secondary | ICD-10-CM | POA: Diagnosis not present

## 2015-08-24 DIAGNOSIS — M545 Low back pain: Secondary | ICD-10-CM | POA: Diagnosis not present

## 2015-08-24 DIAGNOSIS — N183 Chronic kidney disease, stage 3 (moderate): Secondary | ICD-10-CM | POA: Diagnosis not present

## 2015-08-24 DIAGNOSIS — J188 Other pneumonia, unspecified organism: Secondary | ICD-10-CM | POA: Diagnosis not present

## 2015-08-24 DIAGNOSIS — E11649 Type 2 diabetes mellitus with hypoglycemia without coma: Secondary | ICD-10-CM | POA: Diagnosis not present

## 2015-08-24 DIAGNOSIS — E1142 Type 2 diabetes mellitus with diabetic polyneuropathy: Secondary | ICD-10-CM | POA: Diagnosis not present

## 2015-08-24 DIAGNOSIS — I110 Hypertensive heart disease with heart failure: Secondary | ICD-10-CM | POA: Diagnosis not present

## 2015-08-24 DIAGNOSIS — G219 Secondary parkinsonism, unspecified: Secondary | ICD-10-CM | POA: Diagnosis not present

## 2015-08-27 DIAGNOSIS — I110 Hypertensive heart disease with heart failure: Secondary | ICD-10-CM | POA: Diagnosis not present

## 2015-08-27 DIAGNOSIS — E039 Hypothyroidism, unspecified: Secondary | ICD-10-CM | POA: Diagnosis not present

## 2015-08-27 DIAGNOSIS — N182 Chronic kidney disease, stage 2 (mild): Secondary | ICD-10-CM | POA: Diagnosis not present

## 2015-08-27 DIAGNOSIS — G219 Secondary parkinsonism, unspecified: Secondary | ICD-10-CM | POA: Diagnosis not present

## 2015-08-27 DIAGNOSIS — I129 Hypertensive chronic kidney disease with stage 1 through stage 4 chronic kidney disease, or unspecified chronic kidney disease: Secondary | ICD-10-CM | POA: Diagnosis not present

## 2015-08-27 DIAGNOSIS — N183 Chronic kidney disease, stage 3 (moderate): Secondary | ICD-10-CM | POA: Diagnosis not present

## 2015-08-27 DIAGNOSIS — I10 Essential (primary) hypertension: Secondary | ICD-10-CM | POA: Diagnosis not present

## 2015-08-27 DIAGNOSIS — R509 Fever, unspecified: Secondary | ICD-10-CM | POA: Diagnosis not present

## 2015-08-27 DIAGNOSIS — E782 Mixed hyperlipidemia: Secondary | ICD-10-CM | POA: Diagnosis not present

## 2015-08-27 DIAGNOSIS — I5032 Chronic diastolic (congestive) heart failure: Secondary | ICD-10-CM | POA: Diagnosis not present

## 2015-08-27 DIAGNOSIS — M545 Low back pain: Secondary | ICD-10-CM | POA: Diagnosis not present

## 2015-08-28 DIAGNOSIS — E1122 Type 2 diabetes mellitus with diabetic chronic kidney disease: Secondary | ICD-10-CM | POA: Diagnosis not present

## 2015-08-28 DIAGNOSIS — M545 Low back pain: Secondary | ICD-10-CM | POA: Diagnosis not present

## 2015-08-28 DIAGNOSIS — E11649 Type 2 diabetes mellitus with hypoglycemia without coma: Secondary | ICD-10-CM | POA: Diagnosis not present

## 2015-08-28 DIAGNOSIS — Z79891 Long term (current) use of opiate analgesic: Secondary | ICD-10-CM | POA: Diagnosis not present

## 2015-08-28 DIAGNOSIS — I5032 Chronic diastolic (congestive) heart failure: Secondary | ICD-10-CM | POA: Diagnosis not present

## 2015-08-28 DIAGNOSIS — Z7982 Long term (current) use of aspirin: Secondary | ICD-10-CM | POA: Diagnosis not present

## 2015-08-28 DIAGNOSIS — G8929 Other chronic pain: Secondary | ICD-10-CM | POA: Diagnosis not present

## 2015-08-28 DIAGNOSIS — N183 Chronic kidney disease, stage 3 (moderate): Secondary | ICD-10-CM | POA: Diagnosis not present

## 2015-08-28 DIAGNOSIS — I110 Hypertensive heart disease with heart failure: Secondary | ICD-10-CM | POA: Diagnosis not present

## 2015-08-28 DIAGNOSIS — Z794 Long term (current) use of insulin: Secondary | ICD-10-CM | POA: Diagnosis not present

## 2015-08-28 DIAGNOSIS — G219 Secondary parkinsonism, unspecified: Secondary | ICD-10-CM | POA: Diagnosis not present

## 2015-09-04 DIAGNOSIS — E11649 Type 2 diabetes mellitus with hypoglycemia without coma: Secondary | ICD-10-CM | POA: Diagnosis not present

## 2015-09-04 DIAGNOSIS — N183 Chronic kidney disease, stage 3 (moderate): Secondary | ICD-10-CM | POA: Diagnosis not present

## 2015-09-04 DIAGNOSIS — E1122 Type 2 diabetes mellitus with diabetic chronic kidney disease: Secondary | ICD-10-CM | POA: Diagnosis not present

## 2015-09-04 DIAGNOSIS — Z794 Long term (current) use of insulin: Secondary | ICD-10-CM | POA: Diagnosis not present

## 2015-09-04 DIAGNOSIS — I5032 Chronic diastolic (congestive) heart failure: Secondary | ICD-10-CM | POA: Diagnosis not present

## 2015-09-04 DIAGNOSIS — M545 Low back pain: Secondary | ICD-10-CM | POA: Diagnosis not present

## 2015-09-04 DIAGNOSIS — Z7982 Long term (current) use of aspirin: Secondary | ICD-10-CM | POA: Diagnosis not present

## 2015-09-04 DIAGNOSIS — G219 Secondary parkinsonism, unspecified: Secondary | ICD-10-CM | POA: Diagnosis not present

## 2015-09-04 DIAGNOSIS — Z79891 Long term (current) use of opiate analgesic: Secondary | ICD-10-CM | POA: Diagnosis not present

## 2015-09-04 DIAGNOSIS — G8929 Other chronic pain: Secondary | ICD-10-CM | POA: Diagnosis not present

## 2015-09-04 DIAGNOSIS — I110 Hypertensive heart disease with heart failure: Secondary | ICD-10-CM | POA: Diagnosis not present

## 2015-09-05 DIAGNOSIS — M79606 Pain in leg, unspecified: Secondary | ICD-10-CM | POA: Diagnosis not present

## 2015-09-05 DIAGNOSIS — M47817 Spondylosis without myelopathy or radiculopathy, lumbosacral region: Secondary | ICD-10-CM | POA: Diagnosis not present

## 2015-09-05 DIAGNOSIS — M5137 Other intervertebral disc degeneration, lumbosacral region: Secondary | ICD-10-CM | POA: Diagnosis not present

## 2015-09-05 DIAGNOSIS — Z79891 Long term (current) use of opiate analgesic: Secondary | ICD-10-CM | POA: Diagnosis not present

## 2015-09-05 DIAGNOSIS — G894 Chronic pain syndrome: Secondary | ICD-10-CM | POA: Diagnosis not present

## 2015-09-05 DIAGNOSIS — Z79899 Other long term (current) drug therapy: Secondary | ICD-10-CM | POA: Diagnosis not present

## 2015-09-07 DIAGNOSIS — Z79891 Long term (current) use of opiate analgesic: Secondary | ICD-10-CM | POA: Diagnosis not present

## 2015-09-07 DIAGNOSIS — M545 Low back pain: Secondary | ICD-10-CM | POA: Diagnosis not present

## 2015-09-07 DIAGNOSIS — E1122 Type 2 diabetes mellitus with diabetic chronic kidney disease: Secondary | ICD-10-CM | POA: Diagnosis not present

## 2015-09-07 DIAGNOSIS — Z7982 Long term (current) use of aspirin: Secondary | ICD-10-CM | POA: Diagnosis not present

## 2015-09-07 DIAGNOSIS — I110 Hypertensive heart disease with heart failure: Secondary | ICD-10-CM | POA: Diagnosis not present

## 2015-09-07 DIAGNOSIS — G219 Secondary parkinsonism, unspecified: Secondary | ICD-10-CM | POA: Diagnosis not present

## 2015-09-07 DIAGNOSIS — G8929 Other chronic pain: Secondary | ICD-10-CM | POA: Diagnosis not present

## 2015-09-07 DIAGNOSIS — N183 Chronic kidney disease, stage 3 (moderate): Secondary | ICD-10-CM | POA: Diagnosis not present

## 2015-09-07 DIAGNOSIS — E11649 Type 2 diabetes mellitus with hypoglycemia without coma: Secondary | ICD-10-CM | POA: Diagnosis not present

## 2015-09-07 DIAGNOSIS — I5032 Chronic diastolic (congestive) heart failure: Secondary | ICD-10-CM | POA: Diagnosis not present

## 2015-09-07 DIAGNOSIS — Z794 Long term (current) use of insulin: Secondary | ICD-10-CM | POA: Diagnosis not present

## 2015-09-11 DIAGNOSIS — G8929 Other chronic pain: Secondary | ICD-10-CM | POA: Diagnosis not present

## 2015-09-11 DIAGNOSIS — G219 Secondary parkinsonism, unspecified: Secondary | ICD-10-CM | POA: Diagnosis not present

## 2015-09-11 DIAGNOSIS — N183 Chronic kidney disease, stage 3 (moderate): Secondary | ICD-10-CM | POA: Diagnosis not present

## 2015-09-11 DIAGNOSIS — Z794 Long term (current) use of insulin: Secondary | ICD-10-CM | POA: Diagnosis not present

## 2015-09-11 DIAGNOSIS — Z7982 Long term (current) use of aspirin: Secondary | ICD-10-CM | POA: Diagnosis not present

## 2015-09-11 DIAGNOSIS — Z79891 Long term (current) use of opiate analgesic: Secondary | ICD-10-CM | POA: Diagnosis not present

## 2015-09-11 DIAGNOSIS — I5032 Chronic diastolic (congestive) heart failure: Secondary | ICD-10-CM | POA: Diagnosis not present

## 2015-09-11 DIAGNOSIS — I110 Hypertensive heart disease with heart failure: Secondary | ICD-10-CM | POA: Diagnosis not present

## 2015-09-11 DIAGNOSIS — E11649 Type 2 diabetes mellitus with hypoglycemia without coma: Secondary | ICD-10-CM | POA: Diagnosis not present

## 2015-09-11 DIAGNOSIS — M545 Low back pain: Secondary | ICD-10-CM | POA: Diagnosis not present

## 2015-09-11 DIAGNOSIS — E1122 Type 2 diabetes mellitus with diabetic chronic kidney disease: Secondary | ICD-10-CM | POA: Diagnosis not present

## 2015-09-13 DIAGNOSIS — N183 Chronic kidney disease, stage 3 (moderate): Secondary | ICD-10-CM | POA: Diagnosis not present

## 2015-09-13 DIAGNOSIS — E11649 Type 2 diabetes mellitus with hypoglycemia without coma: Secondary | ICD-10-CM | POA: Diagnosis not present

## 2015-09-13 DIAGNOSIS — E1122 Type 2 diabetes mellitus with diabetic chronic kidney disease: Secondary | ICD-10-CM | POA: Diagnosis not present

## 2015-09-13 DIAGNOSIS — I110 Hypertensive heart disease with heart failure: Secondary | ICD-10-CM | POA: Diagnosis not present

## 2015-09-13 DIAGNOSIS — M545 Low back pain: Secondary | ICD-10-CM | POA: Diagnosis not present

## 2015-09-13 DIAGNOSIS — Z79891 Long term (current) use of opiate analgesic: Secondary | ICD-10-CM | POA: Diagnosis not present

## 2015-09-13 DIAGNOSIS — Z7982 Long term (current) use of aspirin: Secondary | ICD-10-CM | POA: Diagnosis not present

## 2015-09-13 DIAGNOSIS — G8929 Other chronic pain: Secondary | ICD-10-CM | POA: Diagnosis not present

## 2015-09-13 DIAGNOSIS — G219 Secondary parkinsonism, unspecified: Secondary | ICD-10-CM | POA: Diagnosis not present

## 2015-09-13 DIAGNOSIS — I5032 Chronic diastolic (congestive) heart failure: Secondary | ICD-10-CM | POA: Diagnosis not present

## 2015-09-13 DIAGNOSIS — Z794 Long term (current) use of insulin: Secondary | ICD-10-CM | POA: Diagnosis not present

## 2015-09-18 DIAGNOSIS — E1122 Type 2 diabetes mellitus with diabetic chronic kidney disease: Secondary | ICD-10-CM | POA: Diagnosis not present

## 2015-09-18 DIAGNOSIS — Z794 Long term (current) use of insulin: Secondary | ICD-10-CM | POA: Diagnosis not present

## 2015-09-18 DIAGNOSIS — E11649 Type 2 diabetes mellitus with hypoglycemia without coma: Secondary | ICD-10-CM | POA: Diagnosis not present

## 2015-09-18 DIAGNOSIS — Z79891 Long term (current) use of opiate analgesic: Secondary | ICD-10-CM | POA: Diagnosis not present

## 2015-09-18 DIAGNOSIS — G219 Secondary parkinsonism, unspecified: Secondary | ICD-10-CM | POA: Diagnosis not present

## 2015-09-18 DIAGNOSIS — G8929 Other chronic pain: Secondary | ICD-10-CM | POA: Diagnosis not present

## 2015-09-18 DIAGNOSIS — Z7982 Long term (current) use of aspirin: Secondary | ICD-10-CM | POA: Diagnosis not present

## 2015-09-18 DIAGNOSIS — M545 Low back pain: Secondary | ICD-10-CM | POA: Diagnosis not present

## 2015-09-18 DIAGNOSIS — N183 Chronic kidney disease, stage 3 (moderate): Secondary | ICD-10-CM | POA: Diagnosis not present

## 2015-09-18 DIAGNOSIS — I5032 Chronic diastolic (congestive) heart failure: Secondary | ICD-10-CM | POA: Diagnosis not present

## 2015-09-18 DIAGNOSIS — I110 Hypertensive heart disease with heart failure: Secondary | ICD-10-CM | POA: Diagnosis not present

## 2015-09-20 DIAGNOSIS — Z7982 Long term (current) use of aspirin: Secondary | ICD-10-CM | POA: Diagnosis not present

## 2015-09-20 DIAGNOSIS — G8929 Other chronic pain: Secondary | ICD-10-CM | POA: Diagnosis not present

## 2015-09-20 DIAGNOSIS — M545 Low back pain: Secondary | ICD-10-CM | POA: Diagnosis not present

## 2015-09-20 DIAGNOSIS — N183 Chronic kidney disease, stage 3 (moderate): Secondary | ICD-10-CM | POA: Diagnosis not present

## 2015-09-20 DIAGNOSIS — E1122 Type 2 diabetes mellitus with diabetic chronic kidney disease: Secondary | ICD-10-CM | POA: Diagnosis not present

## 2015-09-20 DIAGNOSIS — I5032 Chronic diastolic (congestive) heart failure: Secondary | ICD-10-CM | POA: Diagnosis not present

## 2015-09-20 DIAGNOSIS — I110 Hypertensive heart disease with heart failure: Secondary | ICD-10-CM | POA: Diagnosis not present

## 2015-09-20 DIAGNOSIS — Z794 Long term (current) use of insulin: Secondary | ICD-10-CM | POA: Diagnosis not present

## 2015-09-20 DIAGNOSIS — G219 Secondary parkinsonism, unspecified: Secondary | ICD-10-CM | POA: Diagnosis not present

## 2015-09-20 DIAGNOSIS — E11649 Type 2 diabetes mellitus with hypoglycemia without coma: Secondary | ICD-10-CM | POA: Diagnosis not present

## 2015-09-20 DIAGNOSIS — Z79891 Long term (current) use of opiate analgesic: Secondary | ICD-10-CM | POA: Diagnosis not present

## 2015-10-03 DIAGNOSIS — G894 Chronic pain syndrome: Secondary | ICD-10-CM | POA: Diagnosis not present

## 2015-10-03 DIAGNOSIS — M5137 Other intervertebral disc degeneration, lumbosacral region: Secondary | ICD-10-CM | POA: Diagnosis not present

## 2015-10-03 DIAGNOSIS — Z79899 Other long term (current) drug therapy: Secondary | ICD-10-CM | POA: Diagnosis not present

## 2015-10-03 DIAGNOSIS — Z79891 Long term (current) use of opiate analgesic: Secondary | ICD-10-CM | POA: Diagnosis not present

## 2015-10-03 DIAGNOSIS — M47817 Spondylosis without myelopathy or radiculopathy, lumbosacral region: Secondary | ICD-10-CM | POA: Diagnosis not present

## 2015-10-03 DIAGNOSIS — M79606 Pain in leg, unspecified: Secondary | ICD-10-CM | POA: Diagnosis not present

## 2015-10-05 DIAGNOSIS — G629 Polyneuropathy, unspecified: Secondary | ICD-10-CM | POA: Diagnosis not present

## 2015-10-05 DIAGNOSIS — Y9301 Activity, walking, marching and hiking: Secondary | ICD-10-CM | POA: Diagnosis not present

## 2015-10-05 DIAGNOSIS — S40022A Contusion of left upper arm, initial encounter: Secondary | ICD-10-CM | POA: Diagnosis not present

## 2015-10-05 DIAGNOSIS — Z9181 History of falling: Secondary | ICD-10-CM | POA: Diagnosis not present

## 2015-10-12 DIAGNOSIS — Q078 Other specified congenital malformations of nervous system: Secondary | ICD-10-CM | POA: Diagnosis not present

## 2015-10-12 DIAGNOSIS — H25813 Combined forms of age-related cataract, bilateral: Secondary | ICD-10-CM | POA: Diagnosis not present

## 2015-10-12 DIAGNOSIS — H04123 Dry eye syndrome of bilateral lacrimal glands: Secondary | ICD-10-CM | POA: Diagnosis not present

## 2015-10-25 DIAGNOSIS — L853 Xerosis cutis: Secondary | ICD-10-CM | POA: Diagnosis not present

## 2015-10-25 DIAGNOSIS — L0390 Cellulitis, unspecified: Secondary | ICD-10-CM | POA: Diagnosis not present

## 2015-10-30 DIAGNOSIS — Z79891 Long term (current) use of opiate analgesic: Secondary | ICD-10-CM | POA: Diagnosis not present

## 2015-10-30 DIAGNOSIS — Z79899 Other long term (current) drug therapy: Secondary | ICD-10-CM | POA: Diagnosis not present

## 2015-10-30 DIAGNOSIS — M47817 Spondylosis without myelopathy or radiculopathy, lumbosacral region: Secondary | ICD-10-CM | POA: Diagnosis not present

## 2015-10-30 DIAGNOSIS — M79606 Pain in leg, unspecified: Secondary | ICD-10-CM | POA: Diagnosis not present

## 2015-10-30 DIAGNOSIS — M5137 Other intervertebral disc degeneration, lumbosacral region: Secondary | ICD-10-CM | POA: Diagnosis not present

## 2015-10-30 DIAGNOSIS — G894 Chronic pain syndrome: Secondary | ICD-10-CM | POA: Diagnosis not present

## 2015-11-04 DIAGNOSIS — K2970 Gastritis, unspecified, without bleeding: Secondary | ICD-10-CM | POA: Diagnosis not present

## 2015-11-04 DIAGNOSIS — R1111 Vomiting without nausea: Secondary | ICD-10-CM | POA: Diagnosis not present

## 2015-11-05 ENCOUNTER — Encounter (HOSPITAL_COMMUNITY): Payer: Self-pay | Admitting: Nurse Practitioner

## 2015-11-05 ENCOUNTER — Emergency Department (HOSPITAL_COMMUNITY)
Admission: EM | Admit: 2015-11-05 | Discharge: 2015-11-05 | Disposition: A | Discharge status: Home / Self Care | Payer: Medicare Other | Attending: Emergency Medicine | Admitting: Emergency Medicine

## 2015-11-05 DIAGNOSIS — Z7951 Long term (current) use of inhaled steroids: Secondary | ICD-10-CM | POA: Insufficient documentation

## 2015-11-05 DIAGNOSIS — E039 Hypothyroidism, unspecified: Secondary | ICD-10-CM | POA: Insufficient documentation

## 2015-11-05 DIAGNOSIS — R197 Diarrhea, unspecified: Secondary | ICD-10-CM | POA: Diagnosis not present

## 2015-11-05 DIAGNOSIS — N183 Chronic kidney disease, stage 3 (moderate): Secondary | ICD-10-CM | POA: Insufficient documentation

## 2015-11-05 DIAGNOSIS — E1122 Type 2 diabetes mellitus with diabetic chronic kidney disease: Secondary | ICD-10-CM | POA: Diagnosis not present

## 2015-11-05 DIAGNOSIS — F1123 Opioid dependence with withdrawal: Secondary | ICD-10-CM | POA: Diagnosis not present

## 2015-11-05 DIAGNOSIS — Z794 Long term (current) use of insulin: Secondary | ICD-10-CM | POA: Insufficient documentation

## 2015-11-05 DIAGNOSIS — J45909 Unspecified asthma, uncomplicated: Secondary | ICD-10-CM | POA: Insufficient documentation

## 2015-11-05 DIAGNOSIS — Z7982 Long term (current) use of aspirin: Secondary | ICD-10-CM | POA: Insufficient documentation

## 2015-11-05 DIAGNOSIS — R112 Nausea with vomiting, unspecified: Secondary | ICD-10-CM | POA: Diagnosis not present

## 2015-11-05 DIAGNOSIS — Z79899 Other long term (current) drug therapy: Secondary | ICD-10-CM | POA: Insufficient documentation

## 2015-11-05 DIAGNOSIS — Z853 Personal history of malignant neoplasm of breast: Secondary | ICD-10-CM | POA: Diagnosis not present

## 2015-11-05 DIAGNOSIS — F1193 Opioid use, unspecified with withdrawal: Secondary | ICD-10-CM

## 2015-11-05 DIAGNOSIS — G219 Secondary parkinsonism, unspecified: Secondary | ICD-10-CM | POA: Diagnosis not present

## 2015-11-05 DIAGNOSIS — K591 Functional diarrhea: Secondary | ICD-10-CM | POA: Diagnosis not present

## 2015-11-05 DIAGNOSIS — R1110 Vomiting, unspecified: Secondary | ICD-10-CM | POA: Diagnosis not present

## 2015-11-05 DIAGNOSIS — M549 Dorsalgia, unspecified: Secondary | ICD-10-CM | POA: Diagnosis not present

## 2015-11-05 DIAGNOSIS — Z87891 Personal history of nicotine dependence: Secondary | ICD-10-CM | POA: Diagnosis not present

## 2015-11-05 DIAGNOSIS — M545 Low back pain: Secondary | ICD-10-CM | POA: Diagnosis present

## 2015-11-05 LAB — CBC WITH DIFFERENTIAL/PLATELET
BASOS ABS: 0 10*3/uL (ref 0.0–0.1)
Basophils Relative: 0 %
EOS ABS: 0.1 10*3/uL (ref 0.0–0.7)
EOS PCT: 1 %
HCT: 29.8 % — ABNORMAL LOW (ref 36.0–46.0)
HEMOGLOBIN: 9.9 g/dL — AB (ref 12.0–15.0)
LYMPHS ABS: 1.5 10*3/uL (ref 0.7–4.0)
Lymphocytes Relative: 11 %
MCH: 28.2 pg (ref 26.0–34.0)
MCHC: 33.2 g/dL (ref 30.0–36.0)
MCV: 84.9 fL (ref 78.0–100.0)
Monocytes Absolute: 0.7 10*3/uL (ref 0.1–1.0)
Monocytes Relative: 5 %
NEUTROS PCT: 83 %
Neutro Abs: 11.1 10*3/uL — ABNORMAL HIGH (ref 1.7–7.7)
PLATELETS: 292 10*3/uL (ref 150–400)
RBC: 3.51 MIL/uL — AB (ref 3.87–5.11)
RDW: 15.7 % — ABNORMAL HIGH (ref 11.5–15.5)
WBC: 13.5 10*3/uL — AB (ref 4.0–10.5)

## 2015-11-05 LAB — COMPREHENSIVE METABOLIC PANEL
ALT: 23 U/L (ref 14–54)
AST: 19 U/L (ref 15–41)
Albumin: 3.4 g/dL — ABNORMAL LOW (ref 3.5–5.0)
Alkaline Phosphatase: 69 U/L (ref 38–126)
Anion gap: 12 (ref 5–15)
BILIRUBIN TOTAL: 0.5 mg/dL (ref 0.3–1.2)
BUN: 12 mg/dL (ref 6–20)
CHLORIDE: 97 mmol/L — AB (ref 101–111)
CO2: 24 mmol/L (ref 22–32)
Calcium: 9.4 mg/dL (ref 8.9–10.3)
Creatinine, Ser: 0.94 mg/dL (ref 0.44–1.00)
Glucose, Bld: 109 mg/dL — ABNORMAL HIGH (ref 65–99)
POTASSIUM: 3.4 mmol/L — AB (ref 3.5–5.1)
Sodium: 133 mmol/L — ABNORMAL LOW (ref 135–145)
TOTAL PROTEIN: 7.1 g/dL (ref 6.5–8.1)

## 2015-11-05 LAB — URINALYSIS, ROUTINE W REFLEX MICROSCOPIC
Glucose, UA: NEGATIVE mg/dL
HGB URINE DIPSTICK: NEGATIVE
Ketones, ur: 15 mg/dL — AB
NITRITE: NEGATIVE
PROTEIN: NEGATIVE mg/dL
SPECIFIC GRAVITY, URINE: 1.02 (ref 1.005–1.030)
pH: 7.5 (ref 5.0–8.0)

## 2015-11-05 LAB — URINE MICROSCOPIC-ADD ON: RBC / HPF: NONE SEEN RBC/hpf (ref 0–5)

## 2015-11-05 LAB — LIPASE, BLOOD: LIPASE: 26 U/L (ref 11–51)

## 2015-11-05 MED ORDER — IPRATROPIUM BROMIDE 0.02 % IN SOLN
0.5000 mg | Freq: Once | RESPIRATORY_TRACT | Status: AC
  Administered 2015-11-05: 0.5 mg via RESPIRATORY_TRACT
  Filled 2015-11-05: qty 2.5

## 2015-11-05 MED ORDER — FENTANYL CITRATE (PF) 100 MCG/2ML IJ SOLN
50.0000 ug | Freq: Once | INTRAMUSCULAR | Status: AC
  Administered 2015-11-05: 50 ug via INTRAVENOUS
  Filled 2015-11-05: qty 2

## 2015-11-05 MED ORDER — SODIUM CHLORIDE 0.9 % IV BOLUS (SEPSIS)
1000.0000 mL | Freq: Once | INTRAVENOUS | Status: AC
  Administered 2015-11-05: 1000 mL via INTRAVENOUS

## 2015-11-05 MED ORDER — LOPERAMIDE HCL 2 MG PO CAPS: 4.0000 mg | ORAL_CAPSULE | ORAL | Status: DC | PRN

## 2015-11-05 MED ORDER — ONDANSETRON 4 MG PO TBDP: ORAL_TABLET | ORAL | 0 refills | Status: DC

## 2015-11-05 MED ORDER — METOCLOPRAMIDE HCL 5 MG/ML IJ SOLN
10.0000 mg | Freq: Once | INTRAMUSCULAR | Status: AC
  Administered 2015-11-05: 10 mg via INTRAVENOUS
  Filled 2015-11-05: qty 2

## 2015-11-05 MED ORDER — ONDANSETRON HCL 4 MG/2ML IJ SOLN
4.0000 mg | Freq: Once | INTRAMUSCULAR | Status: AC
  Administered 2015-11-05: 4 mg via INTRAVENOUS
  Filled 2015-11-05: qty 2

## 2015-11-05 MED ORDER — DIPHENHYDRAMINE HCL 50 MG/ML IJ SOLN
25.0000 mg | Freq: Once | INTRAMUSCULAR | Status: AC
  Administered 2015-11-05: 25 mg via INTRAVENOUS
  Filled 2015-11-05: qty 1

## 2015-11-05 MED ORDER — LORAZEPAM 2 MG/ML IJ SOLN
1.0000 mg | Freq: Once | INTRAMUSCULAR | Status: AC
  Administered 2015-11-05: 1 mg via INTRAVENOUS
  Filled 2015-11-05: qty 1

## 2015-11-05 MED ORDER — ALBUTEROL SULFATE (2.5 MG/3ML) 0.083% IN NEBU
5.0000 mg | INHALATION_SOLUTION | Freq: Once | RESPIRATORY_TRACT | Status: AC
  Administered 2015-11-05: 5 mg via RESPIRATORY_TRACT
  Filled 2015-11-05: qty 6

## 2015-11-05 NOTE — ED Notes (Signed)
Talked with ER doctor about pt wanting something for back pain.

## 2015-11-05 NOTE — ED Triage Notes (Signed)
Pt would states she can not get in [place]. [facility] has been called to transport

## 2015-11-05 NOTE — Discharge Instructions (Signed)
Drink plenty of fluids this morning, this afternoon if you are feeling better, you can advance to a bland diet, such as toast, crackers, jello, [employer] chicken noodle soup. Avoid milk until the diarrhea is gone. Use the zofran for nausea or vomiting. Recheck if you get a fever or get dehydrated again.

## 2015-11-05 NOTE — ED Notes (Signed)
Pt refused in and out catheter.

## 2015-11-05 NOTE — ED Provider Notes (Signed)
[place] DEPT Provider Note   CSN: [id number] Arrival date & time: 11/05/15  0001  By signing my name below, I, Dolores Hoose, attest that this documentation has been prepared under the direction and in the presence of Rolland Porter, MD . Electronically Signed: Dolores Hoose, Scribe. 11/05/2015. 2:28 AM.  Time seen 02:45 AM   History   Chief Complaint Chief Complaint  Patient presents with  . N/V/D  . Back Pain   The history is provided by the patient. No language interpreter was used.    HPI Comments:  CEIL RODERICK is a 60 y.o. female with PMHx of Asthma who presents to the Emergency Department complaining of sudden-onset unchanged shortness of breath beginning 2 days ago. She states these symptoms are similar to a previously hospitalization where she was diagnosed with asthma. Pt endorses associated productive (brown, yellow) coughing, wheezing, chest tightness, confusion, epigastric abdominal pain, vomiting, diarrhea,  and fever of 99 degrees. When asked about her vomiting and diarrhea, pt states that she has thrown up 4 times today, and has had 4 instances of diarrhea today also. She denies any sick contacts or ingestion of food that could cause her symptoms. Pt has tested her blood sugar earlier and reports that her sugar was 119.   PCP: Dr. Bryon Lions, [facility]: [phone]  Epigastric tenderness Past Medical History:  Diagnosis Date  . Anemia   . Anxiety   . Arthritis    "pretty much all my joints"  . Asthma   . Benign paroxysmal positional vertigo 12/30/2012  . Breast cancer ([place]) 02/13/12   ruq  100'clock bx Ductal Carcinoma in Situ,(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  . Breast mass in female    L breast 2008, US showed likely fat necrosis  . Chronic lower back pain   . CKD (chronic kidney disease), stage III    "lower stage" (01/06/2014)  . Daily headache   . Depression   . GERD (gastroesophageal reflux disease)   . History of blood transfusion 2011    "after one of my OR's"  . History of hiatal hernia   . History of stomach ulcers   . HSV (herpes simplex virus) infection   . Hx of radiation therapy 05/05/12- 07/15/12   right breast, 45 gray x 25 fx, lumpectomy cavity boosted to 16.2 gray  . Hyperlipemia   . Hypertension    sees Dr. Criss Rosales , [facility]  . Hypothyroidism   . Kidney stones   . Knee pain, bilateral   . Migraines    "~ 3 times/month" (01/06/2014)  . Obesity   . OSA on CPAP    pt does not know settings  . Pancreatitis    hx of  . Pneumonia 1950's  . Polyneuropathy in diabetes(357.2)   . Schizophrenia ([place])   . 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 Tachycardia    with sx in 2008  . Thyroid disease   . Type II diabetes mellitus [facility])     Patient Active Problem List   Diagnosis Date Noted  . Elevated troponin 06/24/2015  . Diarrhea 06/23/2015  . Sepsis ([place]) 06/23/2015  . Community acquired pneumonia 06/23/2015  . Obesity, morbid, BMI 50 or higher ([place]) 03/26/2015  . Post traumatic stress disorder (PTSD) 11/14/2014  . Acute encephalopathy 11/12/2014  . Leukocytosis 11/12/2014  . Altered mental status 11/12/2014  . Slurred speech 11/12/2014  . CKD (chronic kidney disease), stage III   . Diabetes mellitus, type II, insulin dependent ([place])   .  TIA (transient ischemic attack)   . Hypotension 10/28/2014  . Syncope 10/28/2014  . Anemia 10/28/2014  . Type II diabetes mellitus ([place]) 10/28/2014  . OSA on CPAP 10/28/2014  . Chronic lower back pain 10/28/2014  . Polyneuropathy in diabetes(357.2) 10/28/2014  . Diabetic polyneuropathy ([place])   . Fall 08/14/2014  . Recurrent falls 08/14/2014  . Hot flashes 02/24/2014  . Arthralgia 02/24/2014  . Hair loss 02/24/2014  . Chest pain 01/06/2014  . Chronic back pain 01/06/2014  . Chest discomfort 09/02/2013  . Shock ([place]) 08/08/2013  . 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 Dysarthria 12/30/2012  . Benign paroxysmal positional vertigo 12/30/2012  .  Breast cancer of upper-outer quadrant of right female breast ([place]) 11/18/2012  . Hemorrhage of rectum and anus 09/28/2012  . Hx of radiation therapy   . Syncope and collapse 06/04/2011  . HSV 10/05/2008  . Asthma 06/02/2008  . HLD (hyperlipidemia) 06/02/2006  . Sinus tachycardia ([place]) 05/25/2006  . Hypothyroidism 02/27/2006  . OBESITY 02/27/2006  . Depression 02/27/2006  . Essential hypertension 02/27/2006  . GERD 02/27/2006  . KNEE PAIN 02/27/2006    Past Surgical History:  Procedure Laterality Date  . ABDOMINAL HYSTERECTOMY  1979?   partial  . BREAST LUMPECTOMY  03/03/2012   Procedure: LUMPECTOMY;  Surgeon: Haywood Lasso, MD;  Location: [place];  Service: General;  Laterality: Right;  . BREAST SURGERY Right 01/2012   "cancer"  . CHOLECYSTECTOMY  1980's?  . COLONOSCOPY WITH PROPOFOL N/A 09/28/2012   Procedure: COLONOSCOPY WITH PROPOFOL;  Surgeon: Lear Ng, MD;  Location: [facility];  Service: Endoscopy;  Laterality: N/A;  . FOOT FRACTURE SURGERY Right 1990's  . JOINT REPLACEMENT    . REVISION TOTAL KNEE ARTHROPLASTY  07/2009  . SHOULDER OPEN ROTATOR CUFF REPAIR Left 1990's  . THYROIDECTOMY  1970's  . TOTAL KNEE ARTHROPLASTY Bilateral 2009-06/2009   left; right    OB History    No data available       Home Medications    Prior to Admission medications   Medication Sig Start Date End Date Taking? Authorizing Provider  albuterol (PROVENTIL) (2.5 MG/3ML) 0.083% nebulizer solution Inhale 3 mLs into the lungs every 6 (six) hours as needed. For shortness of breath 11/03/15  Yes Historical Provider, MD  aspirin EC 81 MG tablet Take 1 tablet (81 mg total) by mouth daily. Patient taking differently: Take 81 mg by mouth at bedtime.  08/12/13  Yes Adeline C Viyuoh, MD  cephALEXin (KEFLEX) 500 MG capsule Take 500 mg by mouth 2 (two) times daily. 7 day therapy course patient should have completed course 11/01/15. 10/25/15  Yes Historical Provider, MD  diclofenac sodium  (VOLTAREN) 1 % GEL Apply 2 g topically 2 (two) times daily as needed (pain).    Yes Historical Provider, MD  esomeprazole (NEXIUM) 40 MG capsule Take 1 capsule (40 mg total) by mouth daily before breakfast. Patient taking differently: Take 40 mg by mouth 2 (two) times daily before a meal.  10/30/14  Yes Barton Dubois, MD  gabapentin (NEURONTIN) 100 MG capsule Take 300 mg by mouth 3 (three) times daily.  10/04/14  Yes Historical Provider, MD  Insulin Detemir (LEVEMIR FLEXTOUCH) 100 UNIT/ML Pen Inject 10 Units into the skin at bedtime.   Yes Historical Provider, MD  insulin lispro (HUMALOG KWIKPEN) 100 UNIT/ML KiwkPen Inject 10 Units into the skin 3 (three) times daily.   Yes Historical Provider, MD  Iron-Vitamins (GERITOL COMPLETE) TABS Take 1 tablet by mouth daily.  Yes Historical Provider, MD  levothyroxine (SYNTHROID, LEVOTHROID) 175 MCG tablet Take 175 mcg by mouth daily. 06/18/15  Yes Historical Provider, MD  LINZESS 290 MCG CAPS capsule Take 290 mcg by mouth daily. 10/06/15  Yes Historical Provider, MD  LORazepam (ATIVAN) 2 MG tablet Take 1 tablet (2 mg total) by mouth every 12 (twelve) hours as needed for anxiety. Patient taking differently: Take 1-2 mg by mouth 2 (two) times daily. Take 1/2 tablet (1 mg) by mouth every morning and 1 tablet (2 mg) at bedtime 11/29/14  Yes [name] Carter, DO  LYRICA 100 MG capsule Take 100 mg by mouth 2 (two) times daily. 10/31/15  Yes Historical Provider, MD  meloxicam (MOBIC) 15 MG tablet Take 15 mg by mouth daily.    Yes Historical Provider, MD  oxyCODONE (ROXICODONE) 15 MG immediate release tablet Take 1 tablet (15 mg total) by mouth every 6 (six) hours as needed for pain. 11/29/14  Yes [name] Carter, DO  PROAIR HFA 108 (90 Base) MCG/ACT inhaler Inhale 1-2 puffs into the lungs every 4 (four) hours as needed. For shortness of breath 11/02/15  Yes Historical Provider, MD  rosuvastatin (CRESTOR) 20 MG tablet Take 20 mg by mouth daily.   Yes Historical Provider, MD    sitaGLIPtin-metformin (JANUMET) 50-500 MG per tablet Take 1 tablet by mouth 2 (two) times daily with a meal.   Yes Historical Provider, MD  Tapentadol HCl (NUCYNTA ER) 250 MG TB12 Take 250 mg by mouth every 12 (twelve) hours.   Yes Historical Provider, MD  valACYclovir (VALTREX) 1000 MG tablet Take 1,000 mg by mouth 2 (two) times daily.    Yes Historical Provider, MD  valsartan-hydrochlorothiazide (DIOVAN-HCT) 160-12.5 MG tablet Take 1 tablet by mouth daily. 04/04/15  Yes Historical Provider, MD  venlafaxine XR (EFFEXOR-XR) 150 MG 24 hr capsule Take 150 mg by mouth 2 (two) times daily. 05/21/15  Yes Historical Provider, MD  ziprasidone (GEODON) 40 MG capsule Take 40 mg by mouth daily after supper. 05/21/15  Yes Historical Provider, MD  levofloxacin (LEVAQUIN) 500 MG tablet Take 1 tablet (500 mg total) by mouth daily. Patient not taking: Reported on 11/05/2015 06/27/15   Nita Sells, MD  ondansetron (ZOFRAN ODT) 4 MG disintegrating tablet Place 1 or 2 on the tongue every 8 hrs as needed for nausea or vomiting 11/05/15   Rolland Porter, MD    Family History Family History  Problem Relation Age of Onset  . Heart disease Brother     Multiple MIs, starting in his 69s  . Diabetes Brother   . Hypertension Mother   . Diabetes Mother   . Breast cancer Mother 76  . Bone cancer Mother   . Hypertension Sister   . Diabetes Sister   . Breast cancer Sister 24  . Breast cancer Maternal Grandmother   . Heart disease Maternal Grandmother   . Uterine cancer Other 19  . Breast cancer Paternal Aunt 28  . Breast cancer Paternal Grandmother     dx in her 40s  . Prostate cancer Paternal Grandfather     Social History Social History  Substance Use Topics  . Smoking status: Former Smoker    Packs/day: 1.00    Years: 7.00    Types: Cigarettes    Quit date: 03/24/1992  . Smokeless tobacco: Never Used  . Alcohol use No     Comment: "stopped drinking in 1996; I was an alcoholic"  lives alone Uses a  walker  Allergies   Bee venom; Shellfish  allergy; Buprenorphine hcl; and Morphine and related   Review of Systems Review of Systems  Constitutional: Positive for fever.  Respiratory: Positive for cough, chest tightness, shortness of breath and wheezing.   Gastrointestinal: Positive for abdominal pain, diarrhea and vomiting.  Neurological: Positive for speech difficulty.  Psychiatric/Behavioral: Positive for confusion.  All other systems reviewed and are negative.    Physical Exam Updated Vital Signs BP 156/92 (BP Location: Right Arm)   Pulse 78   Temp 98.6 F (37 C) (Oral)   Resp 20   SpO2 95%   Vital signs normal    Physical Exam  Constitutional: She is oriented to person, place, and time. She appears well-developed and well-nourished.  Non-toxic appearance. She does not appear ill. No distress.  HENT:  Head: Normocephalic and atraumatic.  Right Ear: External ear normal.  Left Ear: External ear normal.  Nose: Nose normal. No mucosal edema or rhinorrhea.  Mouth/Throat: Mucous membranes are normal. No dental abscesses or uvula swelling.  Tongue dry.   Eyes: Conjunctivae and EOM are normal. Pupils are equal, round, and reactive to light.  Neck: Normal range of motion and full passive range of motion without pain. Neck supple.  Cardiovascular: Regular rhythm and normal heart sounds.  Tachycardia present.  Exam reveals no gallop and no friction rub.   No murmur heard. Pulmonary/Chest: Effort normal and breath sounds normal. Tachypnea noted. She has no wheezes. She has no rhonchi. She has no rales. She exhibits no tenderness and no crepitus.  Abdominal: Soft. Normal appearance and bowel sounds are normal. She exhibits no distension. There is tenderness in the epigastric area. There is no rebound and no guarding.  Musculoskeletal: Normal range of motion. She exhibits no edema or tenderness.  Neurological: She is alert and oriented to person, place, and time. She has normal  strength. No cranial nerve deficit.  At times her speech is incomprehensible.   Skin: Skin is warm, dry and intact. No rash noted. No erythema. No pallor.  Psychiatric: She has a normal mood and affect. Her speech is normal and behavior is normal. Her mood appears not anxious.  Nursing note and vitals reviewed.    ED Treatments / Results  Labs (all labs ordered are listed, but only abnormal results are displayed) Results for orders placed or performed during the hospital encounter of 11/05/15  Lipase, blood  Result Value Ref Range   Lipase 26 11 - 51 U/L  Comprehensive metabolic panel  Result Value Ref Range   Sodium 133 (L) 135 - 145 mmol/L   Potassium 3.4 (L) 3.5 - 5.1 mmol/L   Chloride 97 (L) 101 - 111 mmol/L   CO2 24 22 - 32 mmol/L   Glucose, Bld 109 (H) 65 - 99 mg/dL   BUN 12 6 - 20 mg/dL   Creatinine, Ser 0.94 0.44 - 1.00 mg/dL   Calcium 9.4 8.9 - 10.3 mg/dL   Total Protein 7.1 6.5 - 8.1 g/dL   Albumin 3.4 (L) 3.5 - 5.0 g/dL   AST 19 15 - 41 U/L   ALT 23 14 - 54 U/L   Alkaline Phosphatase 69 38 - 126 U/L   Total Bilirubin 0.5 0.3 - 1.2 mg/dL   GFR calc non Af Amer >60 >60 mL/min   GFR calc Af Amer >60 >60 mL/min   Anion gap 12 5 - 15  Urinalysis, Routine w reflex microscopic  Result Value Ref Range   Color, Urine AMBER (A) YELLOW   APPearance CLEAR CLEAR  Specific Gravity, Urine 1.020 1.005 - 1.030   pH 7.5 5.0 - 8.0   Glucose, UA NEGATIVE NEGATIVE mg/dL   Hgb urine dipstick NEGATIVE NEGATIVE   Bilirubin Urine SMALL (A) NEGATIVE   Ketones, ur 15 (A) NEGATIVE mg/dL   Protein, ur NEGATIVE NEGATIVE mg/dL   Nitrite NEGATIVE NEGATIVE   Leukocytes, UA TRACE (A) NEGATIVE  CBC with Differential  Result Value Ref Range   WBC 13.5 (H) 4.0 - 10.5 K/uL   RBC 3.51 (L) 3.87 - 5.11 MIL/uL   Hemoglobin 9.9 (L) 12.0 - 15.0 g/dL   HCT 29.8 (L) 36.0 - 46.0 %   MCV 84.9 78.0 - 100.0 fL   MCH 28.2 26.0 - 34.0 pg   MCHC 33.2 30.0 - 36.0 g/dL   RDW 15.7 (H) 11.5 - 15.5 %    Platelets 292 150 - 400 K/uL   Neutrophils Relative % 83 %   Neutro Abs 11.1 (H) 1.7 - 7.7 K/uL   Lymphocytes Relative 11 %   Lymphs Abs 1.5 0.7 - 4.0 K/uL   Monocytes Relative 5 %   Monocytes Absolute 0.7 0.1 - 1.0 K/uL   Eosinophils Relative 1 %   Eosinophils Absolute 0.1 0.0 - 0.7 K/uL   Basophils Relative 0 %   Basophils Absolute 0.0 0.0 - 0.1 K/uL  Urine microscopic-add on  Result Value Ref Range   Squamous Epithelial / LPF 6-30 (A) NONE SEEN   WBC, UA 0-5 0 - 5 WBC/hpf   RBC / HPF NONE SEEN 0 - 5 RBC/hpf   Bacteria, UA RARE (A) NONE SEEN   Laboratory interpretation all normal except leukocytosis, mild hyponatremia, mild hypokalemia    EKG  EKG Interpretation None       Radiology No results found.  Procedures Procedures (including critical care time)  Medications Ordered in ED Medications  loperamide (IMODIUM) capsule 4 mg (not administered)  sodium chloride 0.9 % bolus 1,000 mL (0 mLs Intravenous Stopped 11/05/15 0542)  sodium chloride 0.9 % bolus 1,000 mL (0 mLs Intravenous Stopped 11/05/15 0418)  ondansetron (ZOFRAN) injection 4 mg (4 mg Intravenous Given 11/05/15 0308)  albuterol (PROVENTIL) (2.5 MG/3ML) 0.083% nebulizer solution 5 mg (5 mg Nebulization Given 11/05/15 0443)  ipratropium (ATROVENT) nebulizer solution 0.5 mg (0.5 mg Nebulization Given 11/05/15 0443)  fentaNYL (SUBLIMAZE) injection 50 mcg (50 mcg Intravenous Given 11/05/15 0434)  metoCLOPramide (REGLAN) injection 10 mg (10 mg Intravenous Given 11/05/15 0549)  diphenhydrAMINE (BENADRYL) injection 25 mg (25 mg Intravenous Given 11/05/15 0548)  LORazepam (ATIVAN) injection 1 mg (1 mg Intravenous Given 11/05/15 0548)  fentaNYL (SUBLIMAZE) injection 50 mcg (50 mcg Intravenous Given 11/05/15 0549)     Initial Impression / Assessment and Plan / ED Course  I have reviewed the triage vital signs and the nursing notes.  Pertinent labs & imaging results that were available during my care of the patient were  reviewed by me and considered in my medical decision making (see chart for details).  Clinical Course     3:03 AM Discussed treatment plan with pt at bedside which included blood work and pt agreed to plan. She was given IV fluids and IV nausea medication. Once nausea controlled will given oral diarrheal medications. She was given an albuteral/atrovent nebulizer for her c/o SOB, but she didn't have wheezing, she appeared to be hyperventilating and I considered with her diabetes she could be in DKA with the abdominal pain, vomiting and diarrhea.   03:25 AM nurse reports patient requesting medication for her  chronic back pain, states she ran out of her pain medication 3 days ago.   5:30 AM patient still complaining of abdominal pain, diaphoresis and nausea. She appears to be in narcotic withdrawal. She was given more medications.  06:40 AM pt sleeping, when awakened she is feeling better, she has had no diarrhea in the ED, her abdominal pain is gone. She is willing to try an oral challenge. Pt now states she has pain pills at home, she just hasn't been able to take them.   07:40 AM pt has been drinking fluids, no return of abdominal pain, no nausea, vomiting or diarrhea. She has had no c/o SOB since she first arrived.  Pt is ready to be discharged home with zofran.   Review of the [place] shows patient gets #180 oxycodone 15 mg tablets and #60 use and ER 250 mg tablets monthly from [facility] in [place], they were last filled August 22.  Final Clinical Impressions(s) / ED Diagnoses   Final diagnoses:  Nausea vomiting and diarrhea  Narcotic withdrawal (HCC)    New Prescriptions New Prescriptions   ONDANSETRON (ZOFRAN ODT) 4 MG DISINTEGRATING TABLET    Place 1 or 2 on the tongue every 8 hrs as needed for nausea or vomiting   Plan discharge  Rolland Porter, MD, FACEP  I personally performed the services described in this documentation, which  was scribed in my presence. The recorded information has been reviewed and considered.  Rolland Porter, MD, Barbette Or, MD 11/05/15 [phone]

## 2015-11-05 NOTE — ED Notes (Signed)
No respiratory or acute distress noted alert and oriented x 3 call light in reach no reaction to medication noted. 

## 2015-11-05 NOTE — ED Notes (Signed)
Unsuccessful attempt to draw labs. Nurse informed.

## 2015-11-05 NOTE — ED Triage Notes (Signed)
Pt up to wheel chair waiting for [facility] to transport home.

## 2015-11-05 NOTE — ED Triage Notes (Signed)
Pt c/o N/V/D and lower back pain due to her hx of 3 herniated disk.

## 2015-11-05 NOTE — ED Notes (Signed)
No reaction to medication noted alert and oriented x 3 call light in reach no respiratory or acute distress noted no n/v voiced.

## 2015-11-05 NOTE — ED Notes (Signed)
Bed: [facility] Expected date:  Expected time:  Means of arrival:  Comments: 

## 2015-11-05 NOTE — ED Notes (Signed)
No respiratory or acute distress noted alert and oriented x 3 call light in reach helped pt on bedpan urinated about 125cc dark urine.

## 2015-11-21 DIAGNOSIS — S0990XS Unspecified injury of head, sequela: Secondary | ICD-10-CM | POA: Diagnosis not present

## 2015-11-21 DIAGNOSIS — Z9181 History of falling: Secondary | ICD-10-CM | POA: Diagnosis not present

## 2015-11-27 DIAGNOSIS — M47817 Spondylosis without myelopathy or radiculopathy, lumbosacral region: Secondary | ICD-10-CM | POA: Diagnosis not present

## 2015-11-27 DIAGNOSIS — G894 Chronic pain syndrome: Secondary | ICD-10-CM | POA: Diagnosis not present

## 2015-11-27 DIAGNOSIS — Z79891 Long term (current) use of opiate analgesic: Secondary | ICD-10-CM | POA: Diagnosis not present

## 2015-11-27 DIAGNOSIS — Z79899 Other long term (current) drug therapy: Secondary | ICD-10-CM | POA: Diagnosis not present

## 2015-12-25 DIAGNOSIS — Z79899 Other long term (current) drug therapy: Secondary | ICD-10-CM | POA: Diagnosis not present

## 2015-12-25 DIAGNOSIS — G894 Chronic pain syndrome: Secondary | ICD-10-CM | POA: Diagnosis not present

## 2015-12-25 DIAGNOSIS — Z79891 Long term (current) use of opiate analgesic: Secondary | ICD-10-CM | POA: Diagnosis not present

## 2015-12-25 DIAGNOSIS — M545 Low back pain: Secondary | ICD-10-CM | POA: Diagnosis not present

## 2015-12-25 DIAGNOSIS — M47817 Spondylosis without myelopathy or radiculopathy, lumbosacral region: Secondary | ICD-10-CM | POA: Diagnosis not present

## 2015-12-25 DIAGNOSIS — M5137 Other intervertebral disc degeneration, lumbosacral region: Secondary | ICD-10-CM | POA: Diagnosis not present

## 2015-12-27 ENCOUNTER — Other Ambulatory Visit: Payer: Self-pay | Admitting: Nurse Practitioner

## 2015-12-27 ENCOUNTER — Ambulatory Visit
Admission: RE | Admit: 2015-12-27 | Discharge: 2015-12-27 | Disposition: A | Discharge status: Home / Self Care | Payer: Medicare Other | Source: Ambulatory Visit | Attending: Nurse Practitioner | Admitting: Nurse Practitioner

## 2015-12-27 DIAGNOSIS — IMO0002 Reserved for concepts with insufficient information to code with codable children: Secondary | ICD-10-CM

## 2015-12-27 DIAGNOSIS — R229 Localized swelling, mass and lump, unspecified: Principal | ICD-10-CM

## 2015-12-27 DIAGNOSIS — S0990XA Unspecified injury of head, initial encounter: Secondary | ICD-10-CM | POA: Diagnosis not present

## 2016-01-02 DIAGNOSIS — L02811 Cutaneous abscess of head [any part, except face]: Secondary | ICD-10-CM | POA: Diagnosis not present

## 2016-01-02 DIAGNOSIS — R296 Repeated falls: Secondary | ICD-10-CM | POA: Diagnosis not present

## 2016-01-06 ENCOUNTER — Telehealth: Payer: Self-pay | Admitting: Oncology

## 2016-01-06 NOTE — Telephone Encounter (Signed)
Lvm advising appt chg from 04/22/16 to 04/21/16 @ 1.30. Also, mailed appt calendar.

## 2016-01-18 DIAGNOSIS — G4733 Obstructive sleep apnea (adult) (pediatric): Secondary | ICD-10-CM | POA: Diagnosis not present

## 2016-01-22 DIAGNOSIS — M5137 Other intervertebral disc degeneration, lumbosacral region: Secondary | ICD-10-CM | POA: Diagnosis not present

## 2016-01-22 DIAGNOSIS — Z79891 Long term (current) use of opiate analgesic: Secondary | ICD-10-CM | POA: Diagnosis not present

## 2016-01-22 DIAGNOSIS — M79606 Pain in leg, unspecified: Secondary | ICD-10-CM | POA: Diagnosis not present

## 2016-01-22 DIAGNOSIS — G894 Chronic pain syndrome: Secondary | ICD-10-CM | POA: Diagnosis not present

## 2016-01-22 DIAGNOSIS — M47817 Spondylosis without myelopathy or radiculopathy, lumbosacral region: Secondary | ICD-10-CM | POA: Diagnosis not present

## 2016-01-22 DIAGNOSIS — Z79899 Other long term (current) drug therapy: Secondary | ICD-10-CM | POA: Diagnosis not present

## 2016-01-23 DIAGNOSIS — Z853 Personal history of malignant neoplasm of breast: Secondary | ICD-10-CM | POA: Diagnosis not present

## 2016-02-09 IMAGING — MR MR LUMBAR SPINE WO/W CM
4 of 7 series · 18 of 48 positions shown · IV contrast (multihance)
Comparison: Lumbar spine radiograph August 14, 2014 at 3978 hours and
MRI of the lumbar spine April 28, 2013

CLINICAL DATA: Weakness and fall, history of breast cancer,
diabetes.

EXAM:
MRI LUMBAR SPINE WITHOUT AND WITH CONTRAST
TECHNIQUE: Multiplanar and multiecho pulse sequences of the lumbar spine were
obtained without and with intravenous contrast.
CONTRAST:  10mL MULTIHANCE GADOBENATE DIMEGLUMINE 529 MG/ML IV SOLN

[Series 2: T2 · sagittal · 4.0mm · 0.55mm/px · 3 of 13 slices shown (1 of 2)]
[im 1/13]
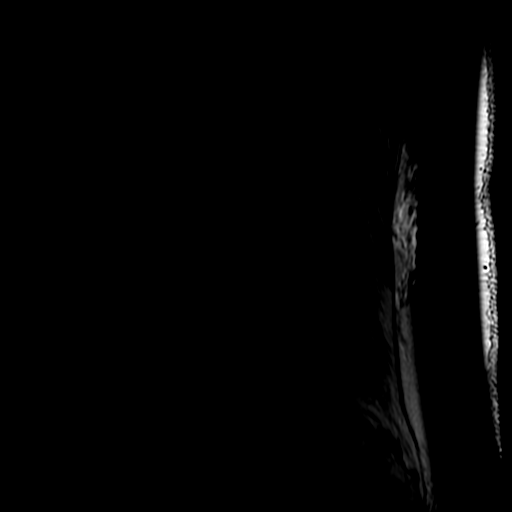
[im 7/13]
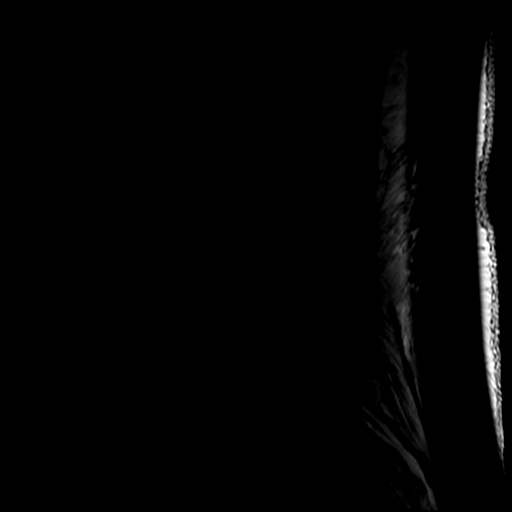
[im 13/13]
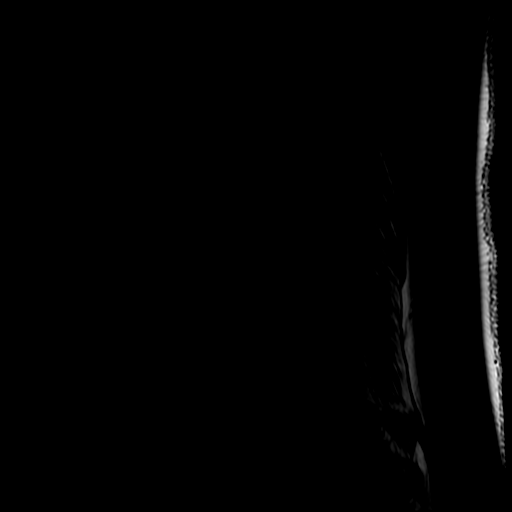

[Series 4: T1 · sagittal · 4.0mm · 0.55mm/px · 3 of 13 slices shown (1 of 2)]
[im 1/13]
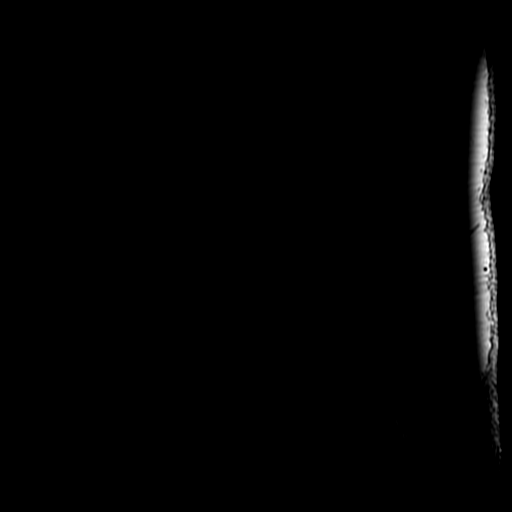
[im 9/13]
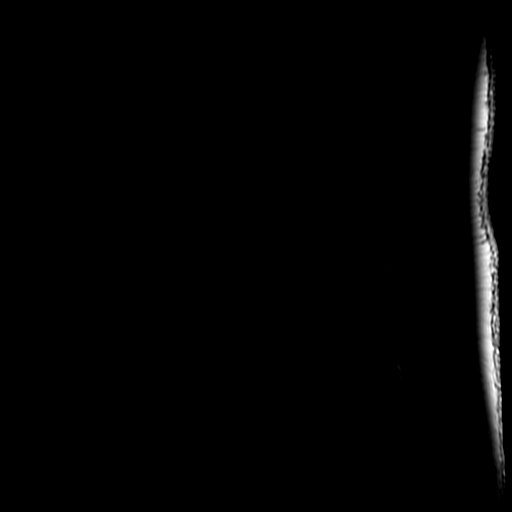
[im 13/13]
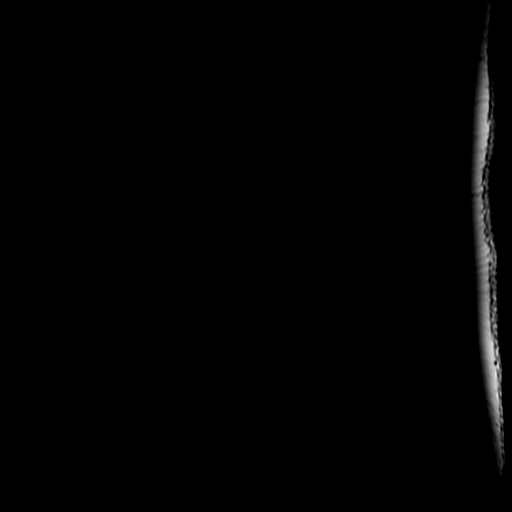

[Series 7: T1 · axial · 4.0mm · 0.39mm/px · z∈[-93,+97]mm · 3 of 40 slices shown (2 of 2)]
[im 4/40]
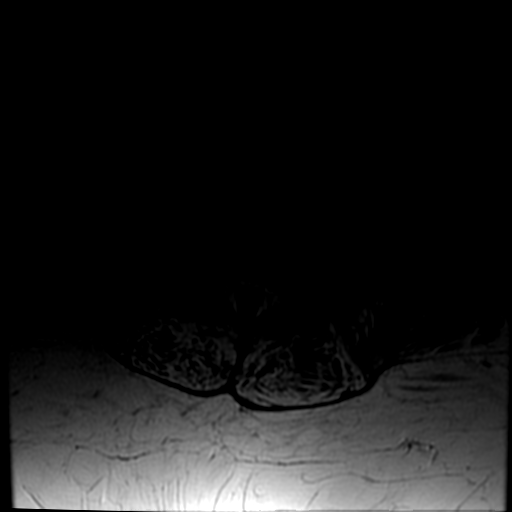
[im 20/40]
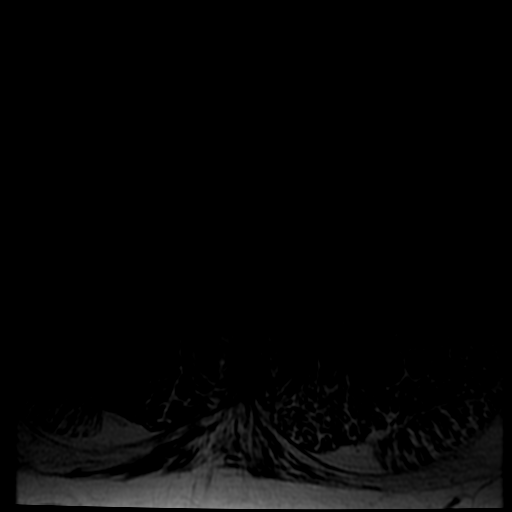
[im 36/40]
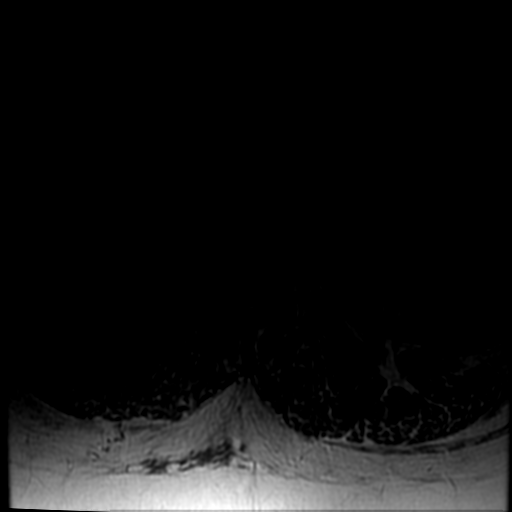

[Series 12: T2 · axial · 4.0mm · 0.39mm/px · z∈[-186,+17]mm · 9 of 40 slices shown (2 of 2)]
[im 1/40]
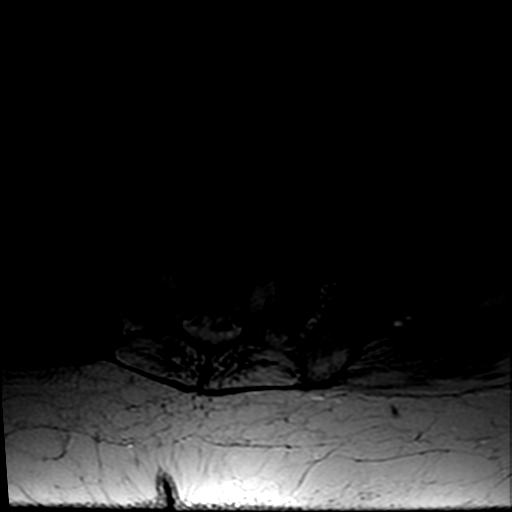
[im 4/40]
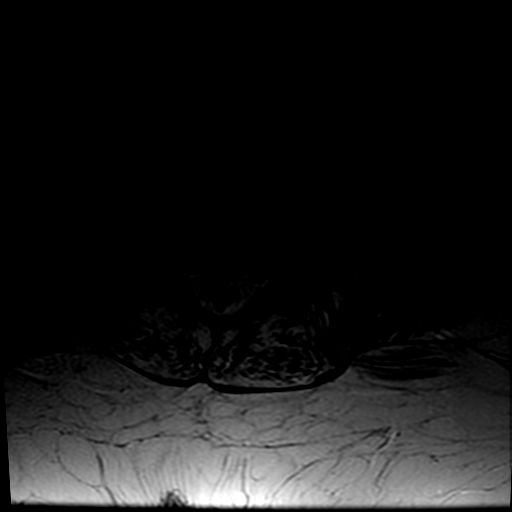
[im 8/40]
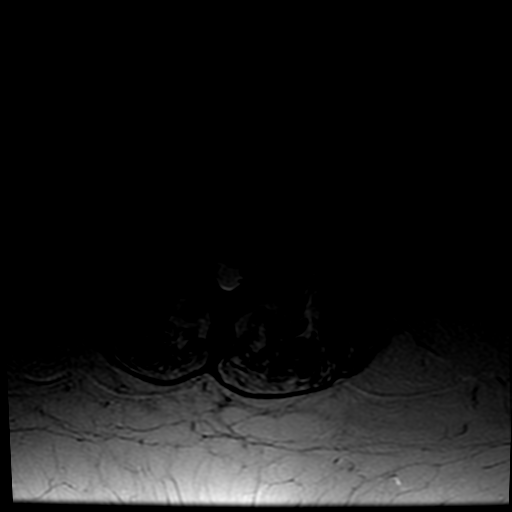
[im 12/40]
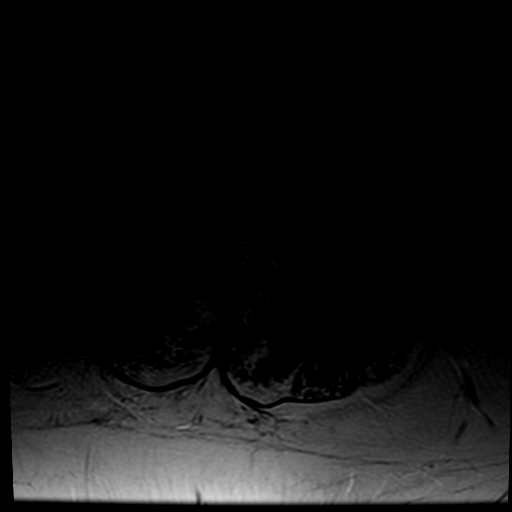
[im 16/40]
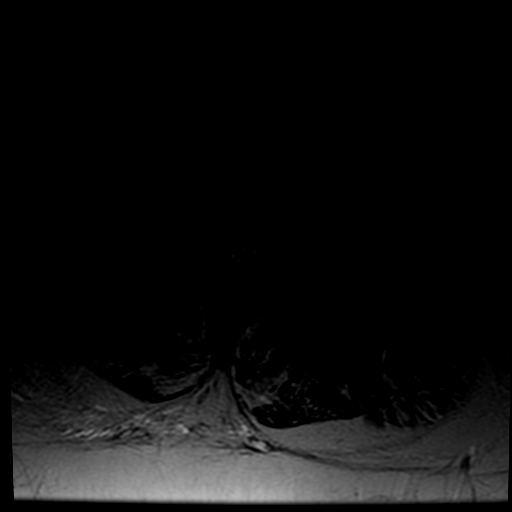
[im 20/40]
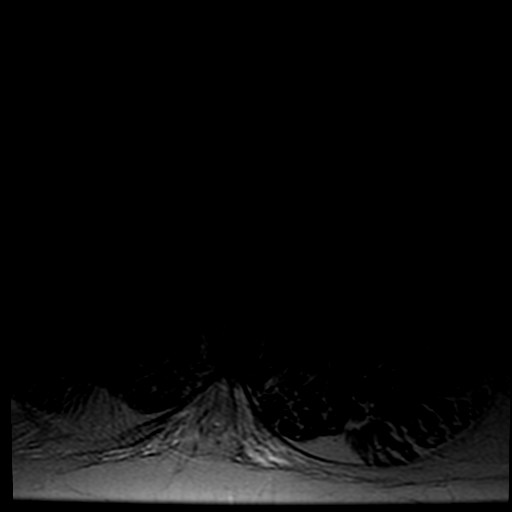
[im 24/40]
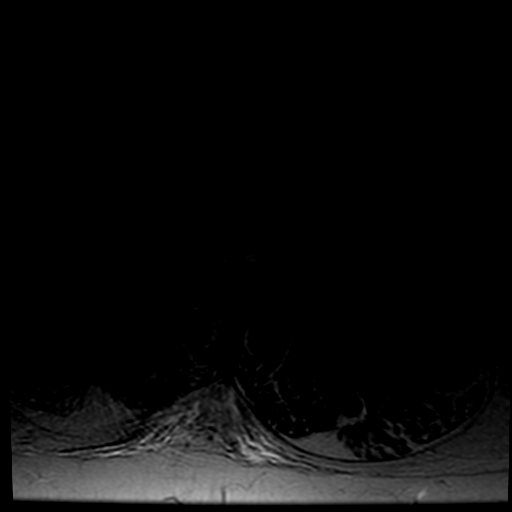
[im 28/40]
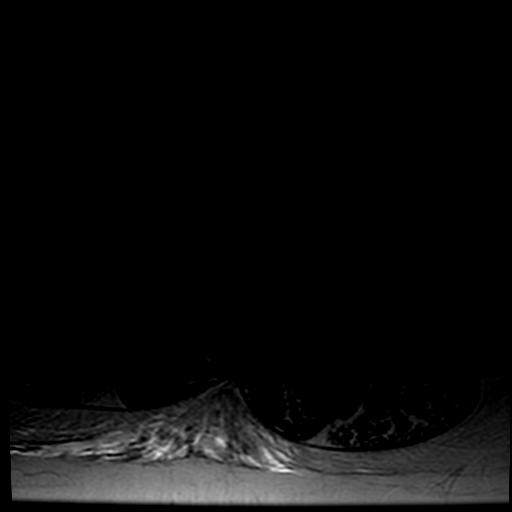
[im 36/40]
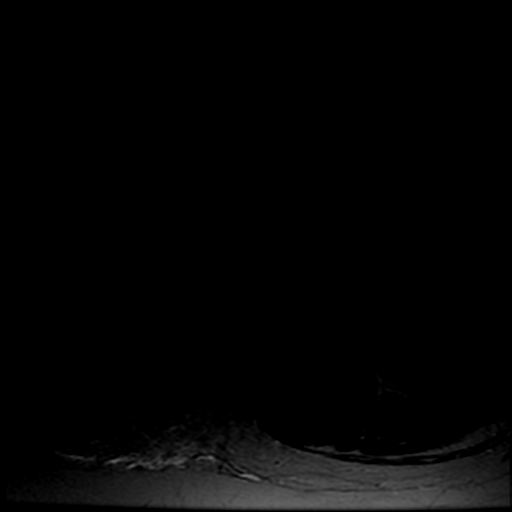

[18 of 48 positions shown; findings below may reference images not displayed]

FINDINGS: Moderately motion degraded examination. The vertebral bodies and
posterior elements appear intact and aligned and maintenance of
lumbar lordosis. Intervertebral discs demonstrate normal morphology,
decreased T2 signal within all lumbar discs consistent with mild
desiccation. Mild chronic discogenic endplate change at all lumbar
levels, no STIR signal abnormality to suggest acute process. No
abnormal osseous or intradiscal enhancement.

Conus medullaris terminates at T12-L1 appears normal morphology and
signal characteristics. Cauda equina is normal in appearance. No
abnormal cord, leptomeningeal or epidural enhancement. Moderate
symmetric paraspinal muscle atrophy. Included prevertebral soft
tissues are normal.

Level by level evaluation (due to motion and, bathroom break, the
axial sequences do not co-register to the sagittal sequences.):

L1-2: Small broad-based RIGHT extra foraminal disc protrusion is
unchanged. Mild facet arthropathy and ligamentum flavum redundancy
without canal stenosis or neural foraminal narrowing.

L2-3: Small RIGHT subarticular disc protrusion. Mild facet
arthropathy and ligamentum flavum redundancy without canal stenosis
though, there is encroachment upon the traversing RIGHT L2 nerve
within the lateral recess, unchanged. Moderate RIGHT, mild LEFT
neural foraminal narrowing.

L3-4: Moderate broad-based disc bulge, severe facet arthropathy and
ligamentum flavum redundancy, the facets are widened at 3-4 mm,
worse than prior examination. Nonenhancing facet effusions. Mild to
moderate canal stenosis. Moderate to severe RIGHT, mild LEFT neural
foraminal narrowing, slightly worse.

L4-5: Small broad-based disc bulge, severe facet arthropathy and
ligamentum flavum redundancy. Mild canal stenosis. Mild to moderate
bilateral neural foraminal narrowing.

L5-S1: Moderate central disc protrusion is unchanged, encroaching
upon the traversing LEFT S1 nerve within the lateral recess.
Moderate facet arthropathy, no canal stenosis. Mild bilateral neural
foraminal narrowing.
IMPRESSION: Moderately motion degraded examination without MR findings of acute
fracture, malalignment or abnormal enhancement.

Advanced L3-4 degenerative changes including mildly widened facets
which can be seen with instability and would be better characterized
on on flexion extension radiographs as clinically indicated.

Mild to moderate canal stenosis L3-4, mild at L4-5.

Neural foraminal narrowing L2-3 through L5-S1: Moderate to severe on
the RIGHT at L3-4.

## 2016-02-09 IMAGING — CR DG LUMBAR SPINE COMPLETE 4+V
5 series · 5 of 5 positions shown · non-contrast
Comparison: Lumbar spine MRI 04/28/2013

CLINICAL DATA: Fall today with weakness. Lumbar and sacral back
pain. Chronic pain, worsened after fall.

EXAM:
LUMBAR SPINE - COMPLETE 4+ VIEW

[l-spine ap]
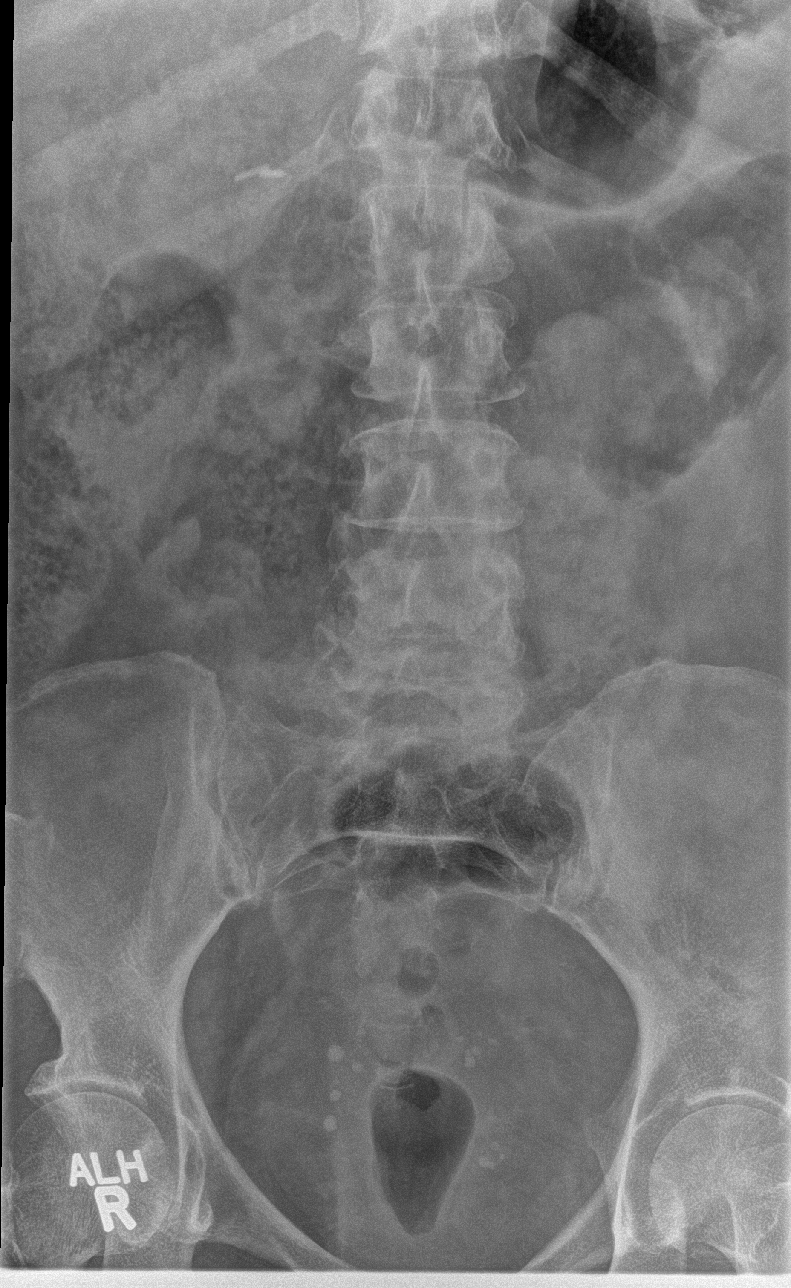

[l-spine obl (1 of 2)]
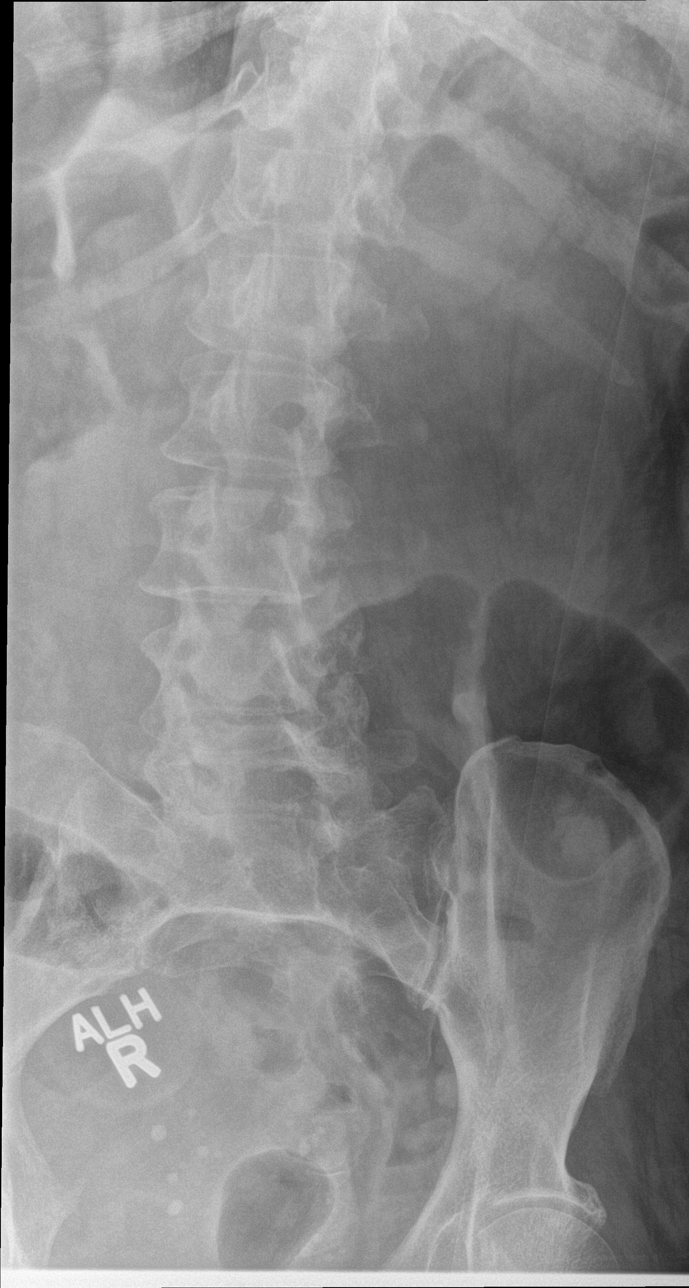

[l-spine obl (2 of 2)]
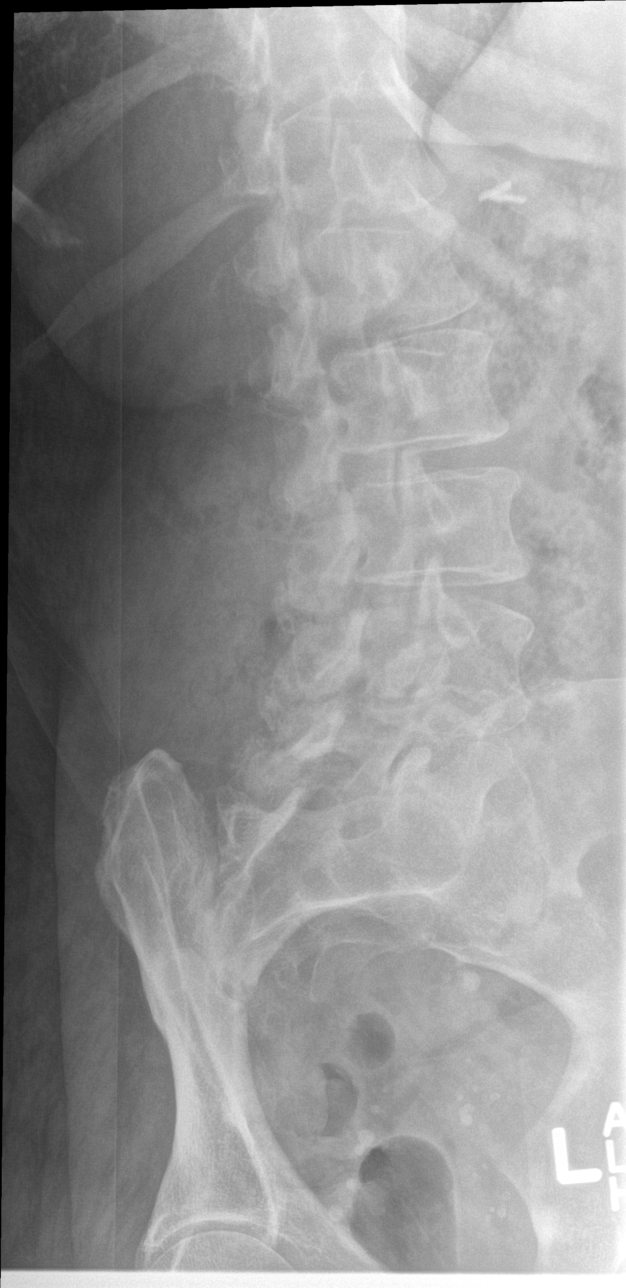

[l-spine lat]
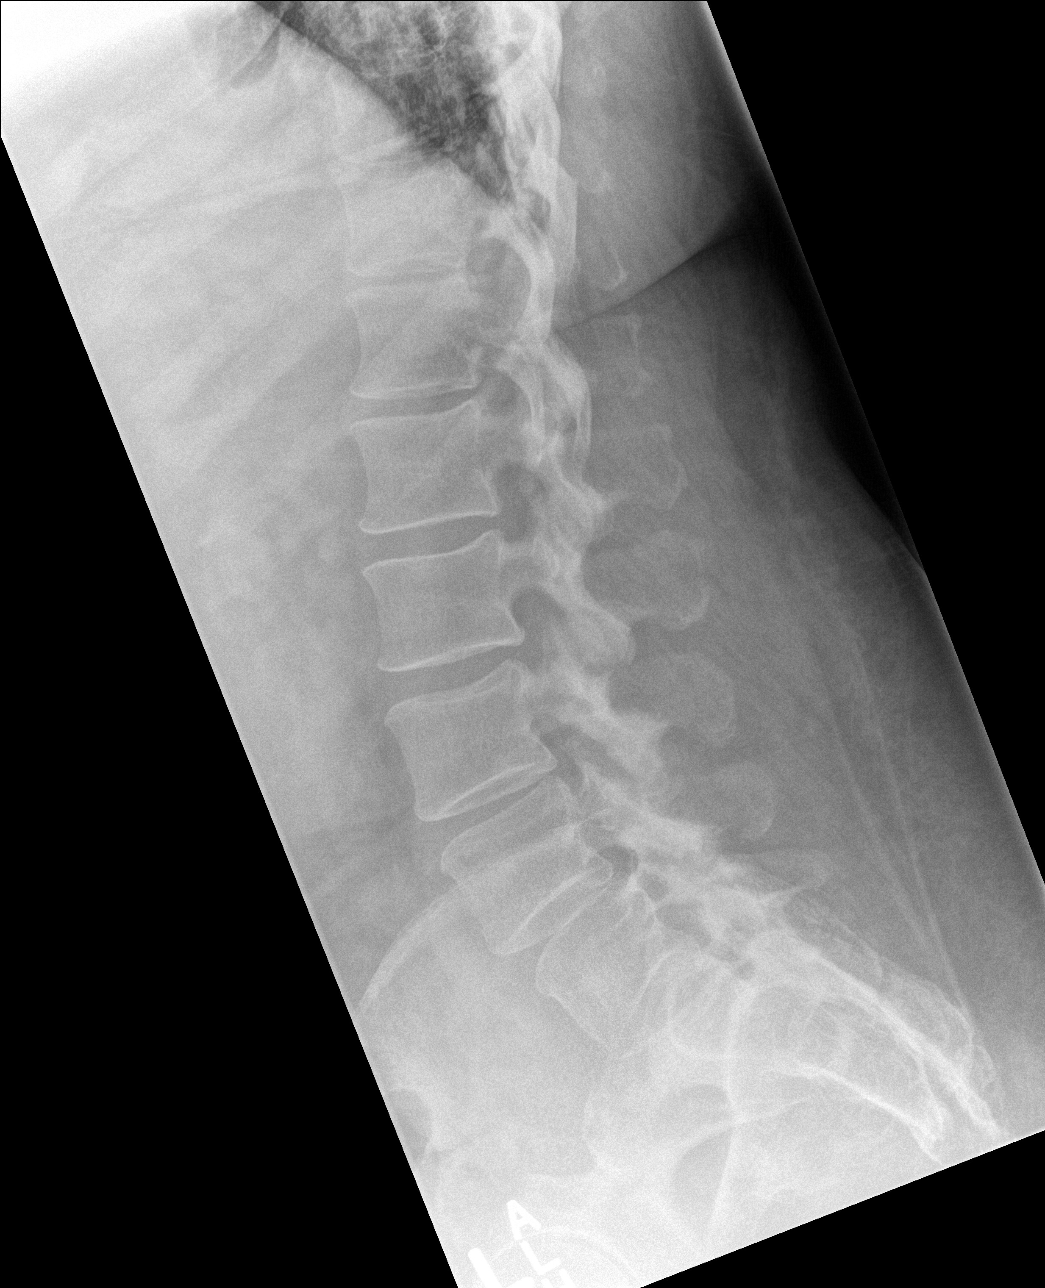

[l-spine spot]
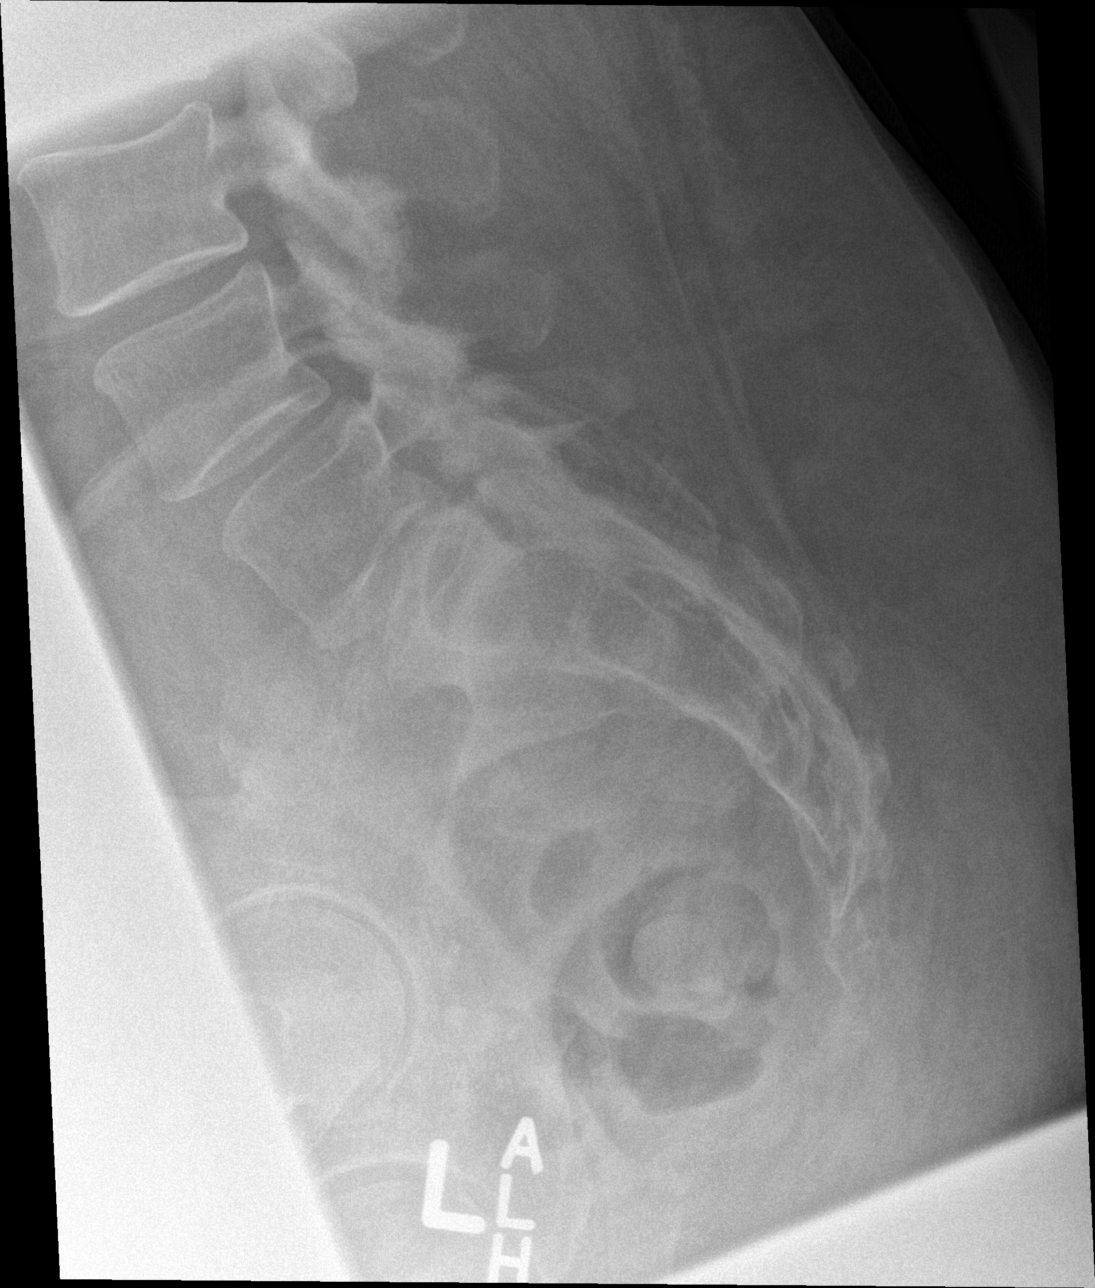

[5 of 5 positions shown; findings below may reference images not displayed]

FINDINGS: The alignment is maintained. Vertebral body heights are normal.
There is no listhesis. The posterior elements are intact. No
fracture. Minimal disc space narrowing at L3-L4 and L4-L5. Facet
arthropathy at L3-L4, L4-L5, and to a lesser extent L5-S1.
Sacroiliac joints are symmetric and normal.
IMPRESSION: Degenerative change in the lower lumbar spine without acute fracture
or subluxation.

## 2016-02-13 ENCOUNTER — Encounter (HOSPITAL_COMMUNITY): Payer: Self-pay | Admitting: Emergency Medicine

## 2016-02-13 ENCOUNTER — Emergency Department (HOSPITAL_COMMUNITY): Payer: Medicare Other

## 2016-02-13 ENCOUNTER — Inpatient Hospital Stay (HOSPITAL_COMMUNITY)
Admission: EM | Admit: 2016-02-13 | Discharge: 2016-02-17 | DRG: 871 | Disposition: A | Discharge status: Home Health | Payer: Medicare Other | Attending: Internal Medicine | Admitting: Internal Medicine

## 2016-02-13 DIAGNOSIS — A419 Sepsis, unspecified organism: Principal | ICD-10-CM | POA: Diagnosis present

## 2016-02-13 DIAGNOSIS — G9341 Metabolic encephalopathy: Secondary | ICD-10-CM | POA: Diagnosis not present

## 2016-02-13 DIAGNOSIS — N183 Chronic kidney disease, stage 3 (moderate): Secondary | ICD-10-CM | POA: Diagnosis present

## 2016-02-13 DIAGNOSIS — Z23 Encounter for immunization: Secondary | ICD-10-CM

## 2016-02-13 DIAGNOSIS — Z833 Family history of diabetes mellitus: Secondary | ICD-10-CM

## 2016-02-13 DIAGNOSIS — E1122 Type 2 diabetes mellitus with diabetic chronic kidney disease: Secondary | ICD-10-CM | POA: Diagnosis present

## 2016-02-13 DIAGNOSIS — G4733 Obstructive sleep apnea (adult) (pediatric): Secondary | ICD-10-CM

## 2016-02-13 DIAGNOSIS — M1990 Unspecified osteoarthritis, unspecified site: Secondary | ICD-10-CM | POA: Diagnosis not present

## 2016-02-13 DIAGNOSIS — R05 Cough: Secondary | ICD-10-CM | POA: Diagnosis not present

## 2016-02-13 DIAGNOSIS — I130 Hypertensive heart and chronic kidney disease with heart failure and stage 1 through stage 4 chronic kidney disease, or unspecified chronic kidney disease: Secondary | ICD-10-CM | POA: Diagnosis present

## 2016-02-13 DIAGNOSIS — R296 Repeated falls: Secondary | ICD-10-CM | POA: Diagnosis not present

## 2016-02-13 DIAGNOSIS — E1142 Type 2 diabetes mellitus with diabetic polyneuropathy: Secondary | ICD-10-CM | POA: Diagnosis not present

## 2016-02-13 DIAGNOSIS — Z8249 Family history of ischemic heart disease and other diseases of the circulatory system: Secondary | ICD-10-CM

## 2016-02-13 DIAGNOSIS — R059 Cough, unspecified: Secondary | ICD-10-CM

## 2016-02-13 DIAGNOSIS — E785 Hyperlipidemia, unspecified: Secondary | ICD-10-CM | POA: Diagnosis present

## 2016-02-13 DIAGNOSIS — R52 Pain, unspecified: Secondary | ICD-10-CM | POA: Diagnosis not present

## 2016-02-13 DIAGNOSIS — K5900 Constipation, unspecified: Secondary | ICD-10-CM | POA: Diagnosis present

## 2016-02-13 DIAGNOSIS — Z8049 Family history of malignant neoplasm of other genital organs: Secondary | ICD-10-CM

## 2016-02-13 DIAGNOSIS — E119 Type 2 diabetes mellitus without complications: Secondary | ICD-10-CM

## 2016-02-13 DIAGNOSIS — I10 Essential (primary) hypertension: Secondary | ICD-10-CM | POA: Diagnosis not present

## 2016-02-13 DIAGNOSIS — Z7982 Long term (current) use of aspirin: Secondary | ICD-10-CM

## 2016-02-13 DIAGNOSIS — Z87891 Personal history of nicotine dependence: Secondary | ICD-10-CM

## 2016-02-13 DIAGNOSIS — I959 Hypotension, unspecified: Secondary | ICD-10-CM | POA: Diagnosis present

## 2016-02-13 DIAGNOSIS — Z885 Allergy status to narcotic agent status: Secondary | ICD-10-CM

## 2016-02-13 DIAGNOSIS — N179 Acute kidney failure, unspecified: Secondary | ICD-10-CM | POA: Diagnosis not present

## 2016-02-13 DIAGNOSIS — K219 Gastro-esophageal reflux disease without esophagitis: Secondary | ICD-10-CM | POA: Diagnosis not present

## 2016-02-13 DIAGNOSIS — R000 Tachycardia, unspecified: Secondary | ICD-10-CM | POA: Diagnosis present

## 2016-02-13 DIAGNOSIS — D509 Iron deficiency anemia, unspecified: Secondary | ICD-10-CM | POA: Diagnosis present

## 2016-02-13 DIAGNOSIS — I5033 Acute on chronic diastolic (congestive) heart failure: Secondary | ICD-10-CM | POA: Diagnosis present

## 2016-02-13 DIAGNOSIS — Z794 Long term (current) use of insulin: Secondary | ICD-10-CM

## 2016-02-13 DIAGNOSIS — Z79899 Other long term (current) drug therapy: Secondary | ICD-10-CM

## 2016-02-13 DIAGNOSIS — Z808 Family history of malignant neoplasm of other organs or systems: Secondary | ICD-10-CM

## 2016-02-13 DIAGNOSIS — G219 Secondary parkinsonism, unspecified: Secondary | ICD-10-CM | POA: Diagnosis present

## 2016-02-13 DIAGNOSIS — Z91030 Bee allergy status: Secondary | ICD-10-CM

## 2016-02-13 DIAGNOSIS — F209 Schizophrenia, unspecified: Secondary | ICD-10-CM | POA: Diagnosis present

## 2016-02-13 DIAGNOSIS — I129 Hypertensive chronic kidney disease with stage 1 through stage 4 chronic kidney disease, or unspecified chronic kidney disease: Secondary | ICD-10-CM | POA: Diagnosis present

## 2016-02-13 DIAGNOSIS — D638 Anemia in other chronic diseases classified elsewhere: Secondary | ICD-10-CM | POA: Diagnosis present

## 2016-02-13 DIAGNOSIS — Z853 Personal history of malignant neoplasm of breast: Secondary | ICD-10-CM | POA: Diagnosis not present

## 2016-02-13 DIAGNOSIS — E039 Hypothyroidism, unspecified: Secondary | ICD-10-CM | POA: Diagnosis not present

## 2016-02-13 DIAGNOSIS — G8929 Other chronic pain: Secondary | ICD-10-CM | POA: Diagnosis present

## 2016-02-13 DIAGNOSIS — J189 Pneumonia, unspecified organism: Secondary | ICD-10-CM | POA: Diagnosis not present

## 2016-02-13 DIAGNOSIS — Z803 Family history of malignant neoplasm of breast: Secondary | ICD-10-CM

## 2016-02-13 DIAGNOSIS — E038 Other specified hypothyroidism: Secondary | ICD-10-CM | POA: Diagnosis not present

## 2016-02-13 DIAGNOSIS — J181 Lobar pneumonia, unspecified organism: Secondary | ICD-10-CM | POA: Diagnosis not present

## 2016-02-13 DIAGNOSIS — E662 Morbid (severe) obesity with alveolar hypoventilation: Secondary | ICD-10-CM | POA: Diagnosis present

## 2016-02-13 DIAGNOSIS — Z96653 Presence of artificial knee joint, bilateral: Secondary | ICD-10-CM | POA: Diagnosis present

## 2016-02-13 DIAGNOSIS — R609 Edema, unspecified: Secondary | ICD-10-CM

## 2016-02-13 DIAGNOSIS — Z9989 Dependence on other enabling machines and devices: Secondary | ICD-10-CM

## 2016-02-13 DIAGNOSIS — Z923 Personal history of irradiation: Secondary | ICD-10-CM

## 2016-02-13 DIAGNOSIS — Z91013 Allergy to seafood: Secondary | ICD-10-CM

## 2016-02-13 DIAGNOSIS — Z6841 Body Mass Index (BMI) 40.0 and over, adult: Secondary | ICD-10-CM

## 2016-02-13 LAB — COMPREHENSIVE METABOLIC PANEL
ALBUMIN: 3.2 g/dL — AB (ref 3.5–5.0)
ALK PHOS: 74 U/L (ref 38–126)
ALT: 23 U/L (ref 14–54)
AST: 27 U/L (ref 15–41)
Anion gap: 13 (ref 5–15)
BILIRUBIN TOTAL: 0.1 mg/dL — AB (ref 0.3–1.2)
BUN: 18 mg/dL (ref 6–20)
CALCIUM: 9.3 mg/dL (ref 8.9–10.3)
CO2: 21 mmol/L — ABNORMAL LOW (ref 22–32)
CREATININE: 1.45 mg/dL — AB (ref 0.44–1.00)
Chloride: 103 mmol/L (ref 101–111)
GFR calc Af Amer: 45 mL/min — ABNORMAL LOW (ref >60)
GFR calc non Af Amer: 39 mL/min — ABNORMAL LOW (ref >60)
GLUCOSE: 119 mg/dL — AB (ref 65–99)
Potassium: 3.9 mmol/L (ref 3.5–5.1)
Sodium: 137 mmol/L (ref 135–145)
TOTAL PROTEIN: 6.4 g/dL — AB (ref 6.5–8.1)

## 2016-02-13 LAB — CBC WITH DIFFERENTIAL/PLATELET
BASOS ABS: 0 10*3/uL (ref 0.0–0.1)
BASOS PCT: 0 %
Eosinophils Absolute: 0.5 10*3/uL (ref 0.0–0.7)
Eosinophils Relative: 4 %
HEMATOCRIT: 27.9 % — AB (ref 36.0–46.0)
HEMOGLOBIN: 8.9 g/dL — AB (ref 12.0–15.0)
LYMPHS PCT: 16 %
Lymphs Abs: 2 10*3/uL (ref 0.7–4.0)
MCH: 27 pg (ref 26.0–34.0)
MCHC: 31.9 g/dL (ref 30.0–36.0)
MCV: 84.5 fL (ref 78.0–100.0)
MONOS PCT: 5 %
Monocytes Absolute: 0.6 10*3/uL (ref 0.1–1.0)
NEUTROS ABS: 9.3 10*3/uL — AB (ref 1.7–7.7)
NEUTROS PCT: 75 %
Platelets: 251 10*3/uL (ref 150–400)
RBC: 3.3 MIL/uL — ABNORMAL LOW (ref 3.87–5.11)
RDW: 17.3 % — ABNORMAL HIGH (ref 11.5–15.5)
WBC: 12.4 10*3/uL — ABNORMAL HIGH (ref 4.0–10.5)

## 2016-02-13 LAB — I-STAT CG4 LACTIC ACID, ED
LACTIC ACID, VENOUS: 1.1 mmol/L (ref 0.5–1.9)
Lactic Acid, Venous: 2.71 mmol/L (ref 0.5–1.9)

## 2016-02-13 LAB — LIPASE, BLOOD: Lipase: 13 U/L (ref 11–51)

## 2016-02-13 LAB — GLUCOSE, CAPILLARY: GLUCOSE-CAPILLARY: 127 mg/dL — AB (ref 65–99)

## 2016-02-13 MED ORDER — DEXTROSE 5 % IV SOLN
1.0000 g | Freq: Once | INTRAVENOUS | Status: AC
  Administered 2016-02-13: 1 g via INTRAVENOUS
  Filled 2016-02-13: qty 10

## 2016-02-13 MED ORDER — LORAZEPAM 1 MG PO TABS: 1.0000 mg | ORAL_TABLET | Freq: Two times a day (BID) | ORAL | Status: DC

## 2016-02-13 MED ORDER — SODIUM CHLORIDE 0.9 % IV BOLUS (SEPSIS): 500.0000 mL | Freq: Once | INTRAVENOUS | Status: DC

## 2016-02-13 MED ORDER — PREGABALIN 100 MG PO CAPS
100.0000 mg | ORAL_CAPSULE | Freq: Two times a day (BID) | ORAL | Status: DC
Start: 2016-02-14 — End: 2016-02-17
  Administered 2016-02-14 – 2016-02-17 (×8): 100 mg via ORAL
  Filled 2016-02-13 (×8): qty 1

## 2016-02-13 MED ORDER — SODIUM CHLORIDE 0.9 % IV BOLUS (SEPSIS): 1000.0000 mL | Freq: Once | INTRAVENOUS | Status: DC

## 2016-02-13 MED ORDER — CEFTRIAXONE SODIUM 1 G IJ SOLR
1.0000 g | INTRAMUSCULAR | Status: DC
  Administered 2016-02-14: 1 g via INTRAVENOUS
  Filled 2016-02-13 (×2): qty 10

## 2016-02-13 MED ORDER — LORAZEPAM 1 MG PO TABS
2.0000 mg | ORAL_TABLET | Freq: Every day | ORAL | Status: DC
  Administered 2016-02-14 – 2016-02-16 (×4): 2 mg via ORAL
  Filled 2016-02-13 (×4): qty 2

## 2016-02-13 MED ORDER — INSULIN DETEMIR 100 UNIT/ML ~~LOC~~ SOLN
10.0000 [IU] | Freq: Every day | SUBCUTANEOUS | Status: DC
  Administered 2016-02-14 – 2016-02-16 (×4): 10 [IU] via SUBCUTANEOUS
  Filled 2016-02-13 (×5): qty 0.1

## 2016-02-13 MED ORDER — OXYCODONE HCL 5 MG PO TABS
15.0000 mg | ORAL_TABLET | ORAL | Status: DC | PRN
  Administered 2016-02-14 – 2016-02-17 (×6): 15 mg via ORAL
  Filled 2016-02-13 (×6): qty 3

## 2016-02-13 MED ORDER — ENOXAPARIN SODIUM 80 MG/0.8ML ~~LOC~~ SOLN
80.0000 mg | Freq: Every day | SUBCUTANEOUS | Status: DC
  Administered 2016-02-14 – 2016-02-17 (×4): 80 mg via SUBCUTANEOUS
  Filled 2016-02-13 (×4): qty 0.8

## 2016-02-13 MED ORDER — ACETAMINOPHEN 325 MG PO TABS
650.0000 mg | ORAL_TABLET | Freq: Once | ORAL | Status: AC
  Administered 2016-02-13: 650 mg via ORAL
  Filled 2016-02-13: qty 2

## 2016-02-13 MED ORDER — DOXEPIN HCL 50 MG PO CAPS
50.0000 mg | ORAL_CAPSULE | Freq: Every day | ORAL | Status: DC
Start: 2016-02-14 — End: 2016-02-17
  Administered 2016-02-14 – 2016-02-16 (×4): 50 mg via ORAL
  Filled 2016-02-13 (×6): qty 1

## 2016-02-13 MED ORDER — PANTOPRAZOLE SODIUM 40 MG PO TBEC
40.0000 mg | DELAYED_RELEASE_TABLET | Freq: Every day | ORAL | Status: DC
  Administered 2016-02-14 – 2016-02-17 (×4): 40 mg via ORAL
  Filled 2016-02-13 (×4): qty 1

## 2016-02-13 MED ORDER — VENLAFAXINE HCL ER 150 MG PO CP24
150.0000 mg | ORAL_CAPSULE | Freq: Two times a day (BID) | ORAL | Status: DC
  Administered 2016-02-14 – 2016-02-17 (×7): 150 mg via ORAL
  Filled 2016-02-13 (×8): qty 1

## 2016-02-13 MED ORDER — DEXTROSE 5 % IV SOLN: 500.0000 mg | INTRAVENOUS | Status: DC

## 2016-02-13 MED ORDER — ZIPRASIDONE HCL 40 MG PO CAPS
40.0000 mg | ORAL_CAPSULE | Freq: Every day | ORAL | Status: DC
  Administered 2016-02-14 – 2016-02-16 (×3): 40 mg via ORAL
  Filled 2016-02-13 (×4): qty 1

## 2016-02-13 MED ORDER — LORAZEPAM 1 MG PO TABS
1.0000 mg | ORAL_TABLET | Freq: Every day | ORAL | Status: DC
  Administered 2016-02-14 – 2016-02-17 (×4): 1 mg via ORAL
  Filled 2016-02-13 (×4): qty 1

## 2016-02-13 MED ORDER — VALACYCLOVIR HCL 500 MG PO TABS
1000.0000 mg | ORAL_TABLET | Freq: Two times a day (BID) | ORAL | Status: DC
  Administered 2016-02-14 – 2016-02-17 (×8): 1000 mg via ORAL
  Filled 2016-02-13 (×8): qty 2

## 2016-02-13 MED ORDER — ONDANSETRON HCL 4 MG/2ML IJ SOLN
4.0000 mg | Freq: Once | INTRAMUSCULAR | Status: AC
  Administered 2016-02-13: 4 mg via INTRAVENOUS
  Filled 2016-02-13: qty 2

## 2016-02-13 MED ORDER — AZITHROMYCIN 500 MG IV SOLR
500.0000 mg | Freq: Once | INTRAVENOUS | Status: AC
  Administered 2016-02-13: 500 mg via INTRAVENOUS
  Filled 2016-02-13: qty 500

## 2016-02-13 MED ORDER — SODIUM CHLORIDE 0.9 % IV BOLUS (SEPSIS)
1000.0000 mL | Freq: Once | INTRAVENOUS | Status: AC
  Administered 2016-02-13: 1000 mL via INTRAVENOUS

## 2016-02-13 MED ORDER — TAPENTADOL HCL 50 MG PO TABS
100.0000 mg | ORAL_TABLET | Freq: Every day | ORAL | Status: DC
  Administered 2016-02-14 – 2016-02-17 (×19): 100 mg via ORAL
  Filled 2016-02-13 (×19): qty 2

## 2016-02-13 MED ORDER — SODIUM CHLORIDE 0.9 % IV BOLUS (SEPSIS)
2000.0000 mL | Freq: Once | INTRAVENOUS | Status: AC
  Administered 2016-02-13: 2000 mL via INTRAVENOUS

## 2016-02-13 MED ORDER — ASPIRIN EC 81 MG PO TBEC
81.0000 mg | DELAYED_RELEASE_TABLET | Freq: Every day | ORAL | Status: DC
  Administered 2016-02-14 – 2016-02-17 (×4): 81 mg via ORAL
  Filled 2016-02-13 (×4): qty 1

## 2016-02-13 MED ORDER — FUROSEMIDE 10 MG/ML IJ SOLN
60.0000 mg | Freq: Once | INTRAMUSCULAR | Status: AC
  Administered 2016-02-14: 60 mg via INTRAVENOUS
  Filled 2016-02-13: qty 6

## 2016-02-13 MED ORDER — LEVOTHYROXINE SODIUM 50 MCG PO TABS
175.0000 ug | ORAL_TABLET | Freq: Every day | ORAL | Status: DC
  Administered 2016-02-14 – 2016-02-15 (×2): 175 ug via ORAL
  Filled 2016-02-13 (×2): qty 1

## 2016-02-13 MED ORDER — INSULIN ASPART 100 UNIT/ML ~~LOC~~ SOLN
0.0000 [IU] | Freq: Three times a day (TID) | SUBCUTANEOUS | Status: DC
  Administered 2016-02-14 – 2016-02-16 (×3): 2 [IU] via SUBCUTANEOUS

## 2016-02-13 NOTE — ED Triage Notes (Signed)
Pt sts body aches x 3 days and some vomiting

## 2016-02-13 NOTE — ED Notes (Signed)
Brought patient back to room via wheelchair with assistance from [place], [place]; patient placed on continuous pulse oximetry and blood pressure cuff; warm blanket given

## 2016-02-13 NOTE — H&P (Signed)
History and Physical    [name] XBJ:[id number] DOB: [birth year] DOA: 02/13/2016  PCP: [name], MD   Patient coming from: Home  Chief Complaint: Generalized weakness, recurrent falls, cough, subjective fever  HPI: [name] is a 60 y.o. woman with multiple comorbidities including DM, hypothyroidism, OSA on CPAP, morbid obesity, HTN, HLD, CKD 3, and breast cancer who presents to the ED for evaluation of  4-5 days of generalized weakness, subjective fevers, cough productive of yellow sputum, and pleuritic chest pain.  No chills or sweats.  She vomited one time last night.  No significant shortness of breath.  No known sick contacts.  She has had multiple falls, but she also admits that she had recurrent falls prior to developing her respiratory symptoms this week.  ED Course: Code Sepsis called.  Initial lactic acid level 2.71.  WBC 12.4 (but she appears to have chronic elevation).  Creatinine up to 1.45 from 1.  Hgb 9.  Chest xray concerning for bibasilar infiltrates (atelectasis vs infiltrate).  EKG shows sinus tachycardia.  4.5L of NS ordered in the ED.  IV Rocephin and azithromycin ordered for CAP.  Respiratory viral panel pending.  Review of Systems: Leg swelling x two weeks.  Multiple herniated discs in her back.  Recent falls; head trauma last week.  Otherwise, 10 systems reviewed and negative except as stated in the HPI.   Past Medical History:  Diagnosis Date  . Anemia   . Anxiety   . Arthritis    "pretty much all my joints"  . Asthma   . Benign paroxysmal positional vertigo 12/30/2012  . Breast cancer ([place]) 02/13/12   ruq  100'clock bx Ductal Carcinoma in Situ,(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  . Breast mass in female    L breast 2008, US showed likely fat necrosis  . Chronic lower back pain   . CKD (chronic kidney disease), stage III    "lower stage" (01/06/2014)  . Daily headache   . Depression   . GERD (gastroesophageal reflux disease)   . History of blood  transfusion 2011   "after one of my OR's"  . History of hiatal hernia   . History of stomach ulcers   . HSV (herpes simplex virus) infection   . Hx of radiation therapy 05/05/12- 07/15/12   right breast, 45 gray x 25 fx, lumpectomy cavity boosted to 16.2 gray  . Hyperlipemia   . Hypertension    sees Dr. Criss Rosales , [place]  . Hypothyroidism   . Kidney stones   . Knee pain, bilateral   . Migraines    "~ 3 times/month" (01/06/2014)  . Obesity   . OSA on CPAP    pt does not know settings  . Pancreatitis    hx of  . Pneumonia 1950's  . Polyneuropathy in diabetes(357.2)   . Schizophrenia ([place])   . 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 Tachycardia    with sx in 2008  . Thyroid disease   . Type II diabetes mellitus ([facility] [facility])     Past Surgical History:  Procedure Laterality Date  . ABDOMINAL HYSTERECTOMY  1979?   partial  . BREAST LUMPECTOMY  03/03/2012   Procedure: LUMPECTOMY;  Surgeon: Haywood Lasso, MD;  Location: [facility];  Service: General;  Laterality: Right;  . BREAST SURGERY Right 01/2012   "cancer"  . CHOLECYSTECTOMY  1980's?  . COLONOSCOPY WITH PROPOFOL N/A 09/28/2012   Procedure: COLONOSCOPY WITH PROPOFOL;  Surgeon: Lear Ng, MD;  Location:  [facility];  Service: Endoscopy;  Laterality: N/A;  . FOOT FRACTURE SURGERY Right 1990's  . JOINT REPLACEMENT    . REVISION TOTAL KNEE ARTHROPLASTY  07/2009  . SHOULDER OPEN ROTATOR CUFF REPAIR Left 1990's  . THYROIDECTOMY  1970's  . TOTAL KNEE ARTHROPLASTY Bilateral 2009-06/2009   left; right     reports that she quit smoking about 23 years ago. Her smoking use included Cigarettes. She has a 7.00 pack-year smoking history. She has never used smokeless tobacco. She reports that she does not drink alcohol or use drugs.  Allergies  Allergen Reactions  . Bee Venom Anaphylaxis  . Shellfish Allergy Anaphylaxis  . Buprenorphine Hcl Other (See Comments)    Overly sedated  . Morphine And Related Other (See Comments)     Overly sedated    Family History  Problem Relation Age of Onset  . Heart disease Brother     Multiple MIs, starting in his 79s  . Diabetes Brother   . Hypertension Mother   . Diabetes Mother   . Breast cancer Mother 9  . Bone cancer Mother   . Hypertension Sister   . Diabetes Sister   . Breast cancer Sister 66  . Breast cancer Maternal Grandmother   . Heart disease Maternal Grandmother   . Uterine cancer Other 19  . Breast cancer Paternal Aunt 23  . Breast cancer Paternal Grandmother     dx in her 72s  . Prostate cancer Paternal Grandfather      Prior to Admission medications   Medication Sig Start Date End Date Taking? Authorizing Provider  albuterol (PROVENTIL) (2.5 MG/3ML) 0.083% nebulizer solution Inhale 3 mLs into the lungs every 6 (six) hours as needed. For shortness of breath 11/03/15  Yes Historical Provider, MD  aspirin EC 81 MG tablet Take 1 tablet (81 mg total) by mouth daily. 08/12/13  Yes Adeline Saralyn Pilar, MD  B Complex-C (B-COMPLEX WITH VITAMIN C) tablet Take 1 tablet by mouth daily.   Yes Historical Provider, MD  Cholecalciferol (VITAMIN D-3) 1000 units CAPS Take 1,000 Units by mouth daily.   Yes Historical Provider, MD  doxepin (SINEQUAN) 25 MG capsule Take 50 mg by mouth at bedtime.   Yes Historical Provider, MD  esomeprazole (NEXIUM) 40 MG capsule Take 1 capsule (40 mg total) by mouth daily before breakfast. Patient taking differently: Take 40 mg by mouth 2 (two) times daily before a meal.  10/30/14  Yes Barton Dubois, MD  Insulin Detemir (LEVEMIR FLEXTOUCH) 100 UNIT/ML Pen Inject 10 Units into the skin at bedtime.   Yes Historical Provider, MD  insulin lispro (HUMALOG KWIKPEN) 100 UNIT/ML KiwkPen Inject 10 Units into the skin 3 (three) times daily.   Yes Historical Provider, MD  levothyroxine (SYNTHROID, LEVOTHROID) 175 MCG tablet Take 175 mcg by mouth daily. 06/18/15  Yes Historical Provider, MD  LORazepam (ATIVAN) 2 MG tablet Take 1 tablet (2 mg total) by  mouth every 12 (twelve) hours as needed for anxiety. Patient taking differently: Take 1-2 mg by mouth 2 (two) times daily. Take 1/2 tablet (1 mg) by mouth every morning and 1 tablet (2 mg) at bedtime 11/29/14  Yes [name] Rastus Borton, DO  LYRICA 100 MG capsule Take 100 mg by mouth 2 (two) times daily. 10/31/15  Yes Historical Provider, MD  meloxicam (MOBIC) 15 MG tablet Take 15 mg by mouth daily.    Yes Historical Provider, MD  oxyCODONE (ROXICODONE) 15 MG immediate release tablet Take 1 tablet (15 mg total) by mouth every  6 (six) hours as needed for pain. Patient taking differently: Take 15 mg by mouth every 4 (four) hours.  11/29/14  Yes Gildardo Cranker, DO  sitaGLIPtin-metformin (JANUMET) 50-500 MG per tablet Take 1 tablet by mouth 2 (two) times daily with a meal.   Yes Historical Provider, MD  Tapentadol HCl (NUCYNTA ER) 250 MG TB12 Take 250 mg by mouth every 12 (twelve) hours.   Yes Historical Provider, MD  tiZANidine (ZANAFLEX) 2 MG tablet Take 2 mg by mouth 2 (two) times daily.   Yes Historical Provider, MD  valACYclovir (VALTREX) 1000 MG tablet Take 1,000 mg by mouth 2 (two) times daily.    Yes Historical Provider, MD  valsartan-hydrochlorothiazide (DIOVAN-HCT) 160-12.5 MG tablet Take 1 tablet by mouth daily. 04/04/15  Yes Historical Provider, MD  venlafaxine XR (EFFEXOR-XR) 150 MG 24 hr capsule Take 150 mg by mouth 2 (two) times daily. 05/21/15  Yes Historical Provider, MD  ziprasidone (GEODON) 40 MG capsule Take 40 mg by mouth daily after supper. 05/21/15  Yes Historical Provider, MD    Physical Exam: Vitals:   02/13/16 1347 02/13/16 1632 02/13/16 1854 02/13/16 2138  BP: 130/64 106/56 106/59 (!) 96/47  Pulse: (!) 122 107 105 101  Resp: _0 Temp: 100.2 F (37.9 C)     TempSrc: Oral     SpO2: 94% 96% 99% 96%  Weight:    (!) 147.4 kg (325 lb)  Height: 5' 5" (1.651 m)   5' 5" (1.651 m)      Constitutional: NAD, calm, comfortable Vitals:   02/13/16 1347 02/13/16 1632 02/13/16 1854  02/13/16 2138  BP: 130/64 106/56 106/59 (!) 96/47  Pulse: (!) 122 107 105 101  Resp: _1 Temp: 100.2 F (37.9 C)     TempSrc: Oral     SpO2: 94% 96% 99% 96%  Weight:    (!) 147.4 kg (325 lb)  Height: 5' 5" (1.651 m)   5' 5" (1.651 m)   Eyes: PERRL, lids and conjunctivae normal ENMT: Mucous membranes are moist. Posterior pharynx clear of any exudate or lesions. Normal dentition.  Neck: normal appearance, supple Respiratory: Bilateral ronchi.  No wheezing, no crackles. Normal respiratory effort. No accessory muscle use.  Cardiovascular: Normal rate, regular rhythm, no murmurs / rubs / gallops. 1+ pitting edema bilaterally.  2+ pedal pulses. GI: abdomen is super obese.  No distention.  No tenderness.  Bowel sounds are present. Musculoskeletal:  No joint deformity in upper and lower extremities. Good ROM, no contractures. Normal muscle tone.  Skin: no rashes, warm and dry Neurologic: No focal deficits. Psychiatric: Normal judgment and insight. Alert and oriented x 3. Normal mood.     Labs on Admission: I have personally reviewed following labs and imaging studies  CBC:  Recent Labs Lab 02/13/16 1905  WBC 12.4*  NEUTROABS 9.3*  HGB 8.9*  HCT 27.9*  MCV 84.5  PLT 916   Basic Metabolic Panel:  Recent Labs Lab 02/13/16 1905  NA 137  K 3.9  CL 103  CO2 21*  GLUCOSE 119*  BUN 18  CREATININE 1.45*  CALCIUM 9.3   GFR: Estimated Creatinine Clearance: 61.5 mL/min (by C-G formula based on SCr of 1.45 mg/dL (H)). Liver Function Tests:  Recent Labs Lab 02/13/16 1905  AST 27  ALT 23  ALKPHOS 74  BILITOT 0.1*  PROT 6.4*  ALBUMIN 3.2*    Recent Labs Lab 02/13/16 1905  LIPASE 13   Sepsis Labs:  Lactic  acid level 2.71, repeat 1.10  Radiological Exams on Admission: Dg Chest 2 View  Result Date: 02/13/2016 CLINICAL DATA:  Cough and congestion. Weakness and body aches for 3 days. EXAM: CHEST  2 VIEW COMPARISON:  06/24/2015 FINDINGS: AP and lateral views  of the chest show borderline cardiomegaly. Vascular congestion noted with probable underlying chronic interstitial changes. Patchy airspace disease noted at the lung bases likely atelectatic although pneumonia not excluded. Surgical clips right lower neck suggest hemithyroidectomy. IMPRESSION: Borderline cardiomegaly with bibasilar atelectasis/ infiltrate. Electronically Signed   By: Misty Stanley M.D.   On: 02/13/2016 19:54    EKG: Independently reviewed. Sinus tachycardia.  Assessment/Plan Principal Problem:   CAP (community acquired pneumonia) Active Problems:   Hypothyroidism   Obesity   Essential hypertension   Recurrent falls   Type II diabetes mellitus ([facility])   OSA on CPAP   Edema   AKI (acute kidney injury) ([place])      Sepsis secondary to CAP, with mild AKI.  No oxygen requirement. --Continue Rocephin and azithromycin --Blood and sputum cultures --Droplet precautions until influenza ruled out --Urine legionella and streptococcal antigens --Repeat BMP, no maintenance fluids for now since patient has edema  Edema --check BNP, echo --One time dose of IV lasix now --Strict I/O --Daily weights  HTN, now relative hypotension --Hold valsartan, HCT  DM --Hold oral meds --Continue levemir, SSI  OSA --CPAP  Hypothyroid --Levothyroxine  Chronic pain --Nucynta, oxycodone for breakthrough  Recent falls --Consider PT eval and treat   DVT prophylaxis: Lovenox Code Status: FULL Family Communication: Patient alone in the ED at the time of admission. Disposition Plan: To be determined. Consults called: NONE Admission status: Place in observation with telemetry monitoring.   TIME SPENT: 70 minutes   Eber Jones MD [facility] Pager [phone]  If 7PM-7AM, please contact night-coverage [email or web address] Password [facility]  02/13/2016, 9:43 PM

## 2016-02-13 NOTE — ED Notes (Signed)
Pt presented to ED with general malaise x1 week and emesis today.

## 2016-02-13 NOTE — ED Notes (Signed)
Called pt to draw labs and pt stated that someone already got blood from her.

## 2016-02-13 NOTE — ED Notes (Signed)
Elevated CG-4 of 2.71 reported to [facility]

## 2016-02-13 NOTE — ED Provider Notes (Signed)
[name] Provider Note   CSN: [id number] Arrival date & time: 02/13/16  1338     History   Chief Complaint Chief Complaint  Patient presents with  . Generalized Body Aches  . Emesis    HPI [name] is a 60 y.o. female.  HPI   60 year old obese female with extensive medical history including diabetes, hypertension, asthma, schizophrenia, thyroid disease, Parkinson, chronic kidney disease presenting to ED with cold symptoms. Patient states she was at home by herself. 4 [place] legs became weak and she fell. Since then she has been having pain to both her lower extremities but does not think she has any broken bone. Furthermore, she also endorsed having subjective fever, chills, body aches, runny nose sneezing coughing. Cough is productive with yellow phlegm. Report pleuritic chest pain and shortness of breath with cough. She is having difficulty climbing up on her bed due to leg pain and weakness. She denies any specific treatment tried. She relates she may have the flu. She did not receive any flu shots a pneumonia shot this year. No recent hospitalization but was treated for pneumonia in February. Patient also report having 2 bouts of emesis yesterday but having normal stool. Also has history of obstructive sleep apnea, having been able to use his sleep apnea machine for the past month due to instrumental failure.  Past Medical History:  Diagnosis Date  . Anemia   . Anxiety   . Arthritis    "pretty much all my joints"  . Asthma   . Benign paroxysmal positional vertigo 12/30/2012  . Breast cancer ([place]) 02/13/12   ruq  100'clock bx Ductal Carcinoma in Situ,(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  . Breast mass in female    L breast 2008, US showed likely fat necrosis  . Chronic lower back pain   . CKD (chronic kidney disease), stage III    "lower stage" (01/06/2014)  . Daily headache   . Depression   . GERD  (gastroesophageal reflux disease)   . History of blood transfusion 2011   "after one of my OR's"  . History of hiatal hernia   . History of stomach ulcers   . HSV (herpes simplex virus) infection   . Hx of radiation therapy 05/05/12- 07/15/12   right breast, 45 gray x 25 fx, lumpectomy cavity boosted to 16.2 gray  . Hyperlipemia   . Hypertension    sees Dr. Criss Rosales , [facility] [place]  . Hypothyroidism   . Kidney stones   . Knee pain, bilateral   . Migraines    "~ 3 times/month" (01/06/2014)  . Obesity   . OSA on CPAP    pt does not know settings  . Pancreatitis    hx of  . Pneumonia 1950's  . Polyneuropathy in diabetes(357.2)   . Schizophrenia ([place])   . 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 Tachycardia    with sx in 2008  . Thyroid disease   . Type II diabetes mellitus [facility])     Patient Active Problem List   Diagnosis Date Noted  . Elevated troponin 06/24/2015  . Diarrhea 06/23/2015  . Sepsis ([place]) 06/23/2015  . Community acquired pneumonia 06/23/2015  . Obesity, morbid, BMI 50 or higher ([place]) 03/26/2015  . Post traumatic stress disorder (PTSD) 11/14/2014  . Acute encephalopathy 11/12/2014  . Leukocytosis 11/12/2014  . Altered mental status 11/12/2014  . Slurred speech 11/12/2014  . CKD (chronic kidney  disease), stage III   . Diabetes mellitus, type II, insulin dependent ([place])   . TIA (transient ischemic attack)   . Hypotension 10/28/2014  . Syncope 10/28/2014  . Anemia 10/28/2014  . Type II diabetes mellitus ([place]) 10/28/2014  . OSA on CPAP 10/28/2014  . Chronic lower back pain 10/28/2014  . Polyneuropathy in diabetes(357.2) 10/28/2014  . Diabetic polyneuropathy ([place])   . Fall 08/14/2014  . Recurrent falls 08/14/2014  . Hot flashes 02/24/2014  . Arthralgia 02/24/2014  . Hair loss 02/24/2014  . Chest pain 01/06/2014  . Chronic back pain 01/06/2014  . Chest discomfort 09/02/2013  . Shock ([place]) 08/08/2013  . 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  Dysarthria 12/30/2012  . Benign paroxysmal positional vertigo 12/30/2012  . Breast cancer of upper-outer quadrant of right female breast ([place]) 11/18/2012  . Hemorrhage of rectum and anus 09/28/2012  . Hx of radiation therapy   . Syncope and collapse 06/04/2011  . HSV 10/05/2008  . Asthma 06/02/2008  . HLD (hyperlipidemia) 06/02/2006  . Sinus tachycardia 05/25/2006  . Hypothyroidism 02/27/2006  . OBESITY 02/27/2006  . Depression 02/27/2006  . Essential hypertension 02/27/2006  . GERD 02/27/2006  . KNEE PAIN 02/27/2006    Past Surgical History:  Procedure Laterality Date  . ABDOMINAL HYSTERECTOMY  1979?   partial  . BREAST LUMPECTOMY  03/03/2012   Procedure: LUMPECTOMY;  Surgeon: Haywood Lasso, MD;  Location: [facility];  Service: General;  Laterality: Right;  . BREAST SURGERY Right 01/2012   "cancer"  . CHOLECYSTECTOMY  1980's?  . COLONOSCOPY WITH PROPOFOL N/A 09/28/2012   Procedure: COLONOSCOPY WITH PROPOFOL;  Surgeon: Lear Ng, MD;  Location: [facility];  Service: Endoscopy;  Laterality: N/A;  . FOOT FRACTURE SURGERY Right 1990's  . JOINT REPLACEMENT    . REVISION TOTAL KNEE ARTHROPLASTY  07/2009  . SHOULDER OPEN ROTATOR CUFF REPAIR Left 1990's  . THYROIDECTOMY  1970's  . TOTAL KNEE ARTHROPLASTY Bilateral 2009-06/2009   left; right    OB History    No data available       Home Medications    Prior to Admission medications   Medication Sig Start Date End Date Taking? Authorizing Provider  albuterol (PROVENTIL) (2.5 MG/3ML) 0.083% nebulizer solution Inhale 3 mLs into the lungs every 6 (six) hours as needed. For shortness of breath 11/03/15   Historical Provider, MD  aspirin EC 81 MG tablet Take 1 tablet (81 mg total) by mouth daily. Patient taking differently: Take 81 mg by mouth at bedtime.  08/12/13   Tammie Oats, MD  cephALEXin (KEFLEX) 500 MG capsule Take 500 mg by mouth 2 (two) times daily. 7 day therapy course patient should have completed course  11/01/15. 10/25/15   Historical Provider, MD  diclofenac sodium (VOLTAREN) 1 % GEL Apply 2 g topically 2 (two) times daily as needed (pain).     Historical Provider, MD  esomeprazole (NEXIUM) 40 MG capsule Take 1 capsule (40 mg total) by mouth daily before breakfast. Patient taking differently: Take 40 mg by mouth 2 (two) times daily before a meal.  10/30/14   Barton Dubois, MD  gabapentin (NEURONTIN) 100 MG capsule Take 300 mg by mouth 3 (three) times daily.  10/04/14   Historical Provider, MD  Insulin Detemir (LEVEMIR FLEXTOUCH) 100 UNIT/ML Pen Inject 10 Units into the skin at bedtime.    Historical Provider, MD  insulin lispro (HUMALOG KWIKPEN) 100 UNIT/ML KiwkPen Inject 10 Units into the skin 3 (three) times daily.  Historical Provider, MD  Iron-Vitamins (GERITOL COMPLETE) TABS Take 1 tablet by mouth daily.    Historical Provider, MD  levofloxacin (LEVAQUIN) 500 MG tablet Take 1 tablet (500 mg total) by mouth daily. Patient not taking: Reported on 11/05/2015 06/27/15   Nita Sells, MD  levothyroxine (SYNTHROID, LEVOTHROID) 175 MCG tablet Take 175 mcg by mouth daily. 06/18/15   Historical Provider, MD  LINZESS 290 MCG CAPS capsule Take 290 mcg by mouth daily. 10/06/15   Historical Provider, MD  LORazepam (ATIVAN) 2 MG tablet Take 1 tablet (2 mg total) by mouth every 12 (twelve) hours as needed for anxiety. Patient taking differently: Take 1-2 mg by mouth 2 (two) times daily. Take 1/2 tablet (1 mg) by mouth every morning and 1 tablet (2 mg) at bedtime 11/29/14   Gildardo Cranker, DO  LYRICA 100 MG capsule Take 100 mg by mouth 2 (two) times daily. 10/31/15   Historical Provider, MD  meloxicam (MOBIC) 15 MG tablet Take 15 mg by mouth daily.     Historical Provider, MD  ondansetron (ZOFRAN ODT) 4 MG disintegrating tablet Place 1 or 2 on the tongue every 8 hrs as needed for nausea or vomiting 11/05/15   Rolland Porter, MD  oxyCODONE (ROXICODONE) 15 MG immediate release tablet Take 1 tablet (15 mg total) by  mouth every 6 (six) hours as needed for pain. 11/29/14   [name] Carter, DO  PROAIR HFA 108 (90 Base) MCG/ACT inhaler Inhale 1-2 puffs into the lungs every 4 (four) hours as needed. For shortness of breath 11/02/15   Historical Provider, MD  rosuvastatin (CRESTOR) 20 MG tablet Take 20 mg by mouth daily.    Historical Provider, MD  sitaGLIPtin-metformin (JANUMET) 50-500 MG per tablet Take 1 tablet by mouth 2 (two) times daily with a meal.    Historical Provider, MD  Tapentadol HCl (NUCYNTA ER) 250 MG TB12 Take 250 mg by mouth every 12 (twelve) hours.    Historical Provider, MD  valACYclovir (VALTREX) 1000 MG tablet Take 1,000 mg by mouth 2 (two) times daily.     Historical Provider, MD  valsartan-hydrochlorothiazide (DIOVAN-HCT) 160-12.5 MG tablet Take 1 tablet by mouth daily. 04/04/15   Historical Provider, MD  venlafaxine XR (EFFEXOR-XR) 150 MG 24 hr capsule Take 150 mg by mouth 2 (two) times daily. 05/21/15   Historical Provider, MD  ziprasidone (GEODON) 40 MG capsule Take 40 mg by mouth daily after supper. 05/21/15   Historical Provider, MD    Family History Family History  Problem Relation Age of Onset  . Heart disease Brother     Multiple MIs, starting in his 82s  . Diabetes Brother   . Hypertension Mother   . Diabetes Mother   . Breast cancer Mother 38  . Bone cancer Mother   . Hypertension Sister   . Diabetes Sister   . Breast cancer Sister 72  . Breast cancer Maternal Grandmother   . Heart disease Maternal Grandmother   . Uterine cancer Other 19  . Breast cancer Paternal Aunt 34  . Breast cancer Paternal Grandmother     dx in her 28s  . Prostate cancer Paternal Grandfather     Social History Social History  Substance Use Topics  . Smoking status: Former Smoker    Packs/day: 1.00    Years: 7.00    Types: Cigarettes    Quit date: 03/24/1992  . Smokeless tobacco: Never Used  . Alcohol use No     Comment: "stopped drinking in 1996; I was an  alcoholic"     Allergies   Bee  venom; Shellfish allergy; Buprenorphine hcl; and Morphine and related   Review of Systems Review of Systems  All other systems reviewed and are negative.    Physical Exam Updated Vital Signs BP 106/56   Pulse 107   Temp 100.2 F (37.9 C) (Oral)   Resp 16   Ht _0  (1.651 m)   SpO2 96%   Physical Exam  Constitutional: She is oriented to person, place, and time. She appears well-developed and well-nourished. No distress.  Mobility obese female laying in bed nontoxic in appearance  HENT:  Head: Atraumatic.  Mouth is dry. Voice is hoarsed  Eyes: Conjunctivae are normal.  Neck: Neck supple.  Cardiovascular:  Tachycardia without murmurs rubs or gallops  Pulmonary/Chest: No stridor.  Decreased breath sounds without overt wheezes, rales, or rhonchi  Abdominal: Soft. There is no tenderness.  Musculoskeletal:  Able to move all 4 extremities with poor effort  Neurological: She is alert and oriented to person, place, and time.  Skin: No rash noted.  Psychiatric: She has a normal mood and affect.  Nursing note and vitals reviewed.    ED Treatments / Results  Labs (all labs ordered are listed, but only abnormal results are displayed) Labs Reviewed  CBC WITH DIFFERENTIAL/PLATELET - Abnormal; Notable for the following:       Result Value   WBC 12.4 (*)    RBC 3.30 (*)    Hemoglobin 8.9 (*)    HCT 27.9 (*)    RDW 17.3 (*)    Neutro Abs 9.3 (*)    All other components within normal limits  COMPREHENSIVE METABOLIC PANEL - Abnormal; Notable for the following:    CO2 21 (*)    Glucose, Bld 119 (*)    Creatinine, Ser 1.45 (*)    Total Protein 6.4 (*)    Albumin 3.2 (*)    Total Bilirubin 0.1 (*)    GFR calc non Af Amer 39 (*)    GFR calc Af Amer 45 (*)    All other components within normal limits  I-STAT CG4 LACTIC ACID, ED - Abnormal; Notable for the following:    Lactic Acid, Venous 2.71 (*)    All other components within normal limits  RESPIRATORY PANEL BY PCR    CULTURE, BLOOD (ROUTINE X 2)  CULTURE, BLOOD (ROUTINE X 2)  URINE CULTURE  LIPASE, BLOOD  I-STAT CG4 LACTIC ACID, ED    EKG  EKG Interpretation  Date/Time:  Wednesday February 13 2016 13:49:15 EST Ventricular Rate:  120 PR Interval:  154 QRS Duration: 76 QT Interval:  326 QTC Calculation: 460 R Axis:   26 Text Interpretation:  Sinus tachycardia with Fusion complexes Low voltage QRS Cannot rule out Anterior infarct , age undetermined Abnormal ECG No significant change was found Confirmed by Florina Ou  MD, Jenny Reichmann ([id number]) on 02/14/2016 1:18:49 PM       Radiology Dg Chest 2 View  Result Date: 02/13/2016 CLINICAL DATA:  Cough and congestion. Weakness and body aches for 3 days. EXAM: CHEST  2 VIEW COMPARISON:  06/24/2015 FINDINGS: AP and lateral views of the chest show borderline cardiomegaly. Vascular congestion noted with probable underlying chronic interstitial changes. Patchy airspace disease noted at the lung bases likely atelectatic although pneumonia not excluded. Surgical clips right lower neck suggest hemithyroidectomy. IMPRESSION: Borderline cardiomegaly with bibasilar atelectasis/ infiltrate. Electronically Signed   By: Misty Stanley M.D.   On: 02/13/2016 19:54    Procedures Procedures (  including critical care time)  Medications Ordered in ED Medications  sodium chloride 0.9 % bolus 1,000 mL (0 mLs Intravenous Stopped 02/14/16 0232)    And  sodium chloride 0.9 % bolus 1,000 mL (0 mLs Intravenous Hold 02/14/16 0231)    And  sodium chloride 0.9 % bolus 500 mL (0 mLs Intravenous Hold 02/14/16 0232)  cefTRIAXone (ROCEPHIN) 1 g in dextrose 5 % 50 mL IVPB (not administered)  doxepin (SINEQUAN) capsule 50 mg (50 mg Oral Given 02/14/16 0106)  pregabalin (LYRICA) capsule 100 mg (100 mg Oral Given 02/14/16 1018)  insulin detemir (LEVEMIR) injection 10 Units (10 Units Subcutaneous Given 02/14/16 0117)  levothyroxine (SYNTHROID, LEVOTHROID) tablet 175 mcg (175 mcg Oral Given 02/14/16  0820)  tapentadol (NUCYNTA) tablet 100 mg (100 mg Oral Given 02/14/16 1308)  venlafaxine XR (EFFEXOR-XR) 24 hr capsule 150 mg (150 mg Oral Given 02/14/16 1019)  ziprasidone (GEODON) capsule 40 mg (not administered)  oxyCODONE (Oxy IR/ROXICODONE) immediate release tablet 15 mg (15 mg Oral Given 02/14/16 1410)  pantoprazole (PROTONIX) EC tablet 40 mg (40 mg Oral Given 02/14/16 1019)  aspirin EC tablet 81 mg (81 mg Oral Given 02/14/16 1019)  valACYclovir (VALTREX) tablet 1,000 mg (1,000 mg Oral Given 02/14/16 1018)  enoxaparin (LOVENOX) injection 80 mg (80 mg Subcutaneous Given 02/14/16 1018)  insulin aspart (novoLOG) injection 0-15 Units (2 Units Subcutaneous Given 02/14/16 1300)  LORazepam (ATIVAN) tablet 1 mg (1 mg Oral Given 02/14/16 1018)    And  LORazepam (ATIVAN) tablet 2 mg (2 mg Oral Given 02/14/16 0105)  Influenza vac split quadrivalent PF (FLUARIX) injection 0.5 mL (not administered)  pneumococcal 23 valent vaccine (PNU-IMMUNE) injection 0.5 mL (not administered)  guaiFENesin (MUCINEX) 12 hr tablet 600 mg (600 mg Oral Given 02/14/16 1308)  fluticasone (FLONASE) 50 MCG/ACT nasal spray 1 spray (1 spray Each Nare Given 02/14/16 1406)  azithromycin (ZITHROMAX) tablet 500 mg (not administered)  sodium chloride 0.9 % bolus 2,000 mL (0 mLs Intravenous Stopped 02/14/16 0232)  acetaminophen (TYLENOL) tablet 650 mg (650 mg Oral Given 02/13/16 2240)  ondansetron (ZOFRAN) injection 4 mg (4 mg Intravenous Given 02/13/16 2231)  cefTRIAXone (ROCEPHIN) 1 g in dextrose 5 % 50 mL IVPB (0 g Intravenous Stopped 02/14/16 0233)  azithromycin (ZITHROMAX) 500 mg in dextrose 5 % 250 mL IVPB (0 mg Intravenous Stopped 02/14/16 0232)  furosemide (LASIX) injection 60 mg (60 mg Intravenous Given 02/14/16 0105)     Initial Impression / Assessment and Plan / ED Course  I have reviewed the triage vital signs and the nursing notes.  Pertinent labs & imaging results that were available during my care of the  patient were reviewed by me and considered in my medical decision making (see chart for details).  Clinical Course     BP 106/59 (BP Location: Left Arm)   Pulse 105   Temp 100.2 F (37.9 C) (Oral)   Resp 15   Ht _0  (1.651 m)   SpO2 99%    Final Clinical Impressions(s) / ED Diagnoses   Final diagnoses:  Community acquired pneumonia, unspecified laterality    New Prescriptions Current Discharge Medication List     7:06 PM Morbidly obese female with multiple comorbidities here with flulike symptoms. She does have an elevated temperature of 100.2, mild tachycardia with a heart rate of 105, and soft blood pressure of 106/59. Workup initiated, IV fluid, Tylenol, and anti-emetic given.  8:56 PM The patient has leukocytosis with WBC 12.4, lactic acid of 2.71, mild anemia with hemoglobin  of 8.9. Chest x-ray shows bibasilar atelectasis versus infiltrate. Since she is febrile, having productive cough, tachycardia with soft blood pressure, post sepsis initiated to treat for community acquired pneumonia. Rocephin and Zithromax was given. Patient was fluid resuscitated at 30 ML per kilogram for a total of 4500 mL. Blood cultures, urinalysis and additional labs obtained.  After receiving 2 L of IV fluid, lactic acid improves from 2.71 to 1.1.   Appreciate consultation from triad hospitalist, Dr. Eulas Post, who agrees to admit patient to telemetry floor, observation status for further management of her condition. A viral respiratory panel was sent.   Domenic Moras, PA-C 02/14/16 [place], MD 02/15/16 [phone]

## 2016-02-14 ENCOUNTER — Observation Stay (HOSPITAL_COMMUNITY): Payer: Medicare Other

## 2016-02-14 ENCOUNTER — Encounter (HOSPITAL_COMMUNITY): Payer: Self-pay

## 2016-02-14 ENCOUNTER — Observation Stay (HOSPITAL_BASED_OUTPATIENT_CLINIC_OR_DEPARTMENT_OTHER): Payer: Medicare Other

## 2016-02-14 DIAGNOSIS — J189 Pneumonia, unspecified organism: Secondary | ICD-10-CM | POA: Diagnosis present

## 2016-02-14 DIAGNOSIS — I10 Essential (primary) hypertension: Secondary | ICD-10-CM | POA: Diagnosis not present

## 2016-02-14 DIAGNOSIS — I959 Hypotension, unspecified: Secondary | ICD-10-CM | POA: Diagnosis present

## 2016-02-14 DIAGNOSIS — M1990 Unspecified osteoarthritis, unspecified site: Secondary | ICD-10-CM | POA: Diagnosis present

## 2016-02-14 DIAGNOSIS — Z923 Personal history of irradiation: Secondary | ICD-10-CM | POA: Diagnosis not present

## 2016-02-14 DIAGNOSIS — Z9989 Dependence on other enabling machines and devices: Secondary | ICD-10-CM

## 2016-02-14 DIAGNOSIS — I129 Hypertensive chronic kidney disease with stage 1 through stage 4 chronic kidney disease, or unspecified chronic kidney disease: Secondary | ICD-10-CM | POA: Diagnosis present

## 2016-02-14 DIAGNOSIS — J181 Lobar pneumonia, unspecified organism: Secondary | ICD-10-CM

## 2016-02-14 DIAGNOSIS — K219 Gastro-esophageal reflux disease without esophagitis: Secondary | ICD-10-CM | POA: Diagnosis present

## 2016-02-14 DIAGNOSIS — E1122 Type 2 diabetes mellitus with diabetic chronic kidney disease: Secondary | ICD-10-CM | POA: Diagnosis present

## 2016-02-14 DIAGNOSIS — E1142 Type 2 diabetes mellitus with diabetic polyneuropathy: Secondary | ICD-10-CM | POA: Diagnosis present

## 2016-02-14 DIAGNOSIS — G9341 Metabolic encephalopathy: Secondary | ICD-10-CM | POA: Diagnosis present

## 2016-02-14 DIAGNOSIS — I5033 Acute on chronic diastolic (congestive) heart failure: Secondary | ICD-10-CM | POA: Diagnosis not present

## 2016-02-14 DIAGNOSIS — R296 Repeated falls: Secondary | ICD-10-CM | POA: Diagnosis present

## 2016-02-14 DIAGNOSIS — E038 Other specified hypothyroidism: Secondary | ICD-10-CM | POA: Diagnosis not present

## 2016-02-14 DIAGNOSIS — E785 Hyperlipidemia, unspecified: Secondary | ICD-10-CM | POA: Diagnosis present

## 2016-02-14 DIAGNOSIS — F209 Schizophrenia, unspecified: Secondary | ICD-10-CM | POA: Diagnosis present

## 2016-02-14 DIAGNOSIS — E662 Morbid (severe) obesity with alveolar hypoventilation: Secondary | ICD-10-CM | POA: Diagnosis present

## 2016-02-14 DIAGNOSIS — Z853 Personal history of malignant neoplasm of breast: Secondary | ICD-10-CM | POA: Diagnosis not present

## 2016-02-14 DIAGNOSIS — G219 Secondary parkinsonism, unspecified: Secondary | ICD-10-CM | POA: Diagnosis present

## 2016-02-14 DIAGNOSIS — R609 Edema, unspecified: Secondary | ICD-10-CM

## 2016-02-14 DIAGNOSIS — N179 Acute kidney failure, unspecified: Secondary | ICD-10-CM | POA: Diagnosis not present

## 2016-02-14 DIAGNOSIS — E119 Type 2 diabetes mellitus without complications: Secondary | ICD-10-CM | POA: Diagnosis not present

## 2016-02-14 DIAGNOSIS — R52 Pain, unspecified: Secondary | ICD-10-CM | POA: Diagnosis present

## 2016-02-14 DIAGNOSIS — R0609 Other forms of dyspnea: Secondary | ICD-10-CM | POA: Diagnosis not present

## 2016-02-14 DIAGNOSIS — N183 Chronic kidney disease, stage 3 (moderate): Secondary | ICD-10-CM | POA: Diagnosis present

## 2016-02-14 DIAGNOSIS — Z6841 Body Mass Index (BMI) 40.0 and over, adult: Secondary | ICD-10-CM | POA: Diagnosis not present

## 2016-02-14 DIAGNOSIS — G4733 Obstructive sleep apnea (adult) (pediatric): Secondary | ICD-10-CM

## 2016-02-14 DIAGNOSIS — Z23 Encounter for immunization: Secondary | ICD-10-CM | POA: Diagnosis not present

## 2016-02-14 DIAGNOSIS — I130 Hypertensive heart and chronic kidney disease with heart failure and stage 1 through stage 4 chronic kidney disease, or unspecified chronic kidney disease: Secondary | ICD-10-CM | POA: Diagnosis present

## 2016-02-14 DIAGNOSIS — A419 Sepsis, unspecified organism: Secondary | ICD-10-CM | POA: Diagnosis present

## 2016-02-14 DIAGNOSIS — E039 Hypothyroidism, unspecified: Secondary | ICD-10-CM | POA: Diagnosis present

## 2016-02-14 DIAGNOSIS — R000 Tachycardia, unspecified: Secondary | ICD-10-CM | POA: Diagnosis present

## 2016-02-14 LAB — RESPIRATORY PANEL BY PCR
ADENOVIRUS-RVPPCR: NOT DETECTED
Bordetella pertussis: NOT DETECTED
CORONAVIRUS 229E-RVPPCR: NOT DETECTED
CORONAVIRUS HKU1-RVPPCR: NOT DETECTED
CORONAVIRUS OC43-RVPPCR: NOT DETECTED
Chlamydophila pneumoniae: NOT DETECTED
Coronavirus NL63: NOT DETECTED
Influenza A: NOT DETECTED
Influenza B: NOT DETECTED
METAPNEUMOVIRUS-RVPPCR: NOT DETECTED
Mycoplasma pneumoniae: NOT DETECTED
PARAINFLUENZA VIRUS 1-RVPPCR: NOT DETECTED
PARAINFLUENZA VIRUS 2-RVPPCR: NOT DETECTED
Parainfluenza Virus 3: NOT DETECTED
Parainfluenza Virus 4: NOT DETECTED
Respiratory Syncytial Virus: NOT DETECTED
Rhinovirus / Enterovirus: NOT DETECTED

## 2016-02-14 LAB — BRAIN NATRIURETIC PEPTIDE: B NATRIURETIC PEPTIDE 5: 36.1 pg/mL (ref 0.0–100.0)

## 2016-02-14 LAB — BASIC METABOLIC PANEL
Anion gap: 8 (ref 5–15)
BUN: 20 mg/dL (ref 6–20)
CALCIUM: 8.7 mg/dL — AB (ref 8.9–10.3)
CHLORIDE: 108 mmol/L (ref 101–111)
CO2: 24 mmol/L (ref 22–32)
CREATININE: 1.44 mg/dL — AB (ref 0.44–1.00)
GFR calc non Af Amer: 39 mL/min — ABNORMAL LOW (ref >60)
GFR, EST AFRICAN AMERICAN: 45 mL/min — AB (ref >60)
Glucose, Bld: 138 mg/dL — ABNORMAL HIGH (ref 65–99)
Potassium: 3.8 mmol/L (ref 3.5–5.1)
SODIUM: 140 mmol/L (ref 135–145)

## 2016-02-14 LAB — GLUCOSE, CAPILLARY
GLUCOSE-CAPILLARY: 82 mg/dL (ref 65–99)
GLUCOSE-CAPILLARY: 130 mg/dL — AB (ref 65–99)
Glucose-Capillary: 125 mg/dL — ABNORMAL HIGH (ref 65–99)
Glucose-Capillary: 158 mg/dL — ABNORMAL HIGH (ref 65–99)

## 2016-02-14 LAB — ECHOCARDIOGRAM LIMITED
Height: 65 in
Weight: 5443.2 oz

## 2016-02-14 LAB — STREP PNEUMONIAE URINARY ANTIGEN: Strep Pneumo Urinary Antigen: NEGATIVE

## 2016-02-14 LAB — MRSA PCR SCREENING: MRSA by PCR: NEGATIVE

## 2016-02-14 LAB — CBC
HCT: 23.4 % — ABNORMAL LOW (ref 36.0–46.0)
Hemoglobin: 7.4 g/dL — ABNORMAL LOW (ref 12.0–15.0)
MCH: 27 pg (ref 26.0–34.0)
MCHC: 31.6 g/dL (ref 30.0–36.0)
MCV: 85.4 fL (ref 78.0–100.0)
Platelets: 206 10*3/uL (ref 150–400)
RBC: 2.74 MIL/uL — ABNORMAL LOW (ref 3.87–5.11)
RDW: 17.7 % — AB (ref 11.5–15.5)
WBC: 9 10*3/uL (ref 4.0–10.5)

## 2016-02-14 LAB — TSH: TSH: 0.269 u[IU]/mL — AB (ref 0.350–4.500)

## 2016-02-14 MED ORDER — GUAIFENESIN ER 600 MG PO TB12
600.0000 mg | ORAL_TABLET | Freq: Two times a day (BID) | ORAL | Status: DC
  Administered 2016-02-14 – 2016-02-17 (×7): 600 mg via ORAL
  Filled 2016-02-14 (×7): qty 1

## 2016-02-14 MED ORDER — AZITHROMYCIN 500 MG PO TABS
500.0000 mg | ORAL_TABLET | Freq: Every day | ORAL | Status: DC
  Administered 2016-02-14: 500 mg via ORAL
  Filled 2016-02-14: qty 1

## 2016-02-14 MED ORDER — FLUTICASONE PROPIONATE 50 MCG/ACT NA SUSP
1.0000 | Freq: Every day | NASAL | Status: DC
Start: 2016-02-14 — End: 2016-02-17
  Administered 2016-02-14 – 2016-02-16 (×3): 1 via NASAL
  Filled 2016-02-14: qty 16

## 2016-02-14 MED ORDER — INFLUENZA VAC SPLIT QUAD 0.5 ML IM SUSY
0.5000 mL | PREFILLED_SYRINGE | INTRAMUSCULAR | Status: AC
  Administered 2016-02-16: 0.5 mL via INTRAMUSCULAR
  Filled 2016-02-14: qty 0.5

## 2016-02-14 MED ORDER — PNEUMOCOCCAL VAC POLYVALENT 25 MCG/0.5ML IJ INJ
0.5000 mL | INJECTION | INTRAMUSCULAR | Status: AC
  Administered 2016-02-16: 0.5 mL via INTRAMUSCULAR
  Filled 2016-02-14: qty 0.5

## 2016-02-14 NOTE — Progress Notes (Signed)
  Echocardiogram 2D Echocardiogram limited has been performed.  Aggie Cosier 02/14/2016, 1:33 PM

## 2016-02-14 NOTE — Progress Notes (Signed)
*  PRELIMINARY RESULTS* Vascular Ultrasound Bilateral lower exemity venous duplex has been completed.  Preliminary findings: Very difficult exam due to pt body habitus and penetration.  No obvious signs of deep vein thrombosis in the visualized veins of the lower extremities.  Negative for bakers cysts bilaterally.  Everrett Coombe 02/14/2016, 11:39 AM

## 2016-02-14 NOTE — Progress Notes (Signed)
Patient will have RN call when she is ready for CPAP.

## 2016-02-14 NOTE — Evaluation (Addendum)
Physical Therapy Evaluation Patient Details Name: [name] MRN: [id number] DOB: January 25, 1956 Today's Date: 02/14/2016   History of Present Illness   [name] is a 60 y.o. woman with multiple comorbidities including DM, hypothyroidism, OSA on CPAP, morbid obesity, HTN, HLD, CKD 3, and breast cancer who presents to the ED for evaluation of  4-5 days of generalized weakness, subjective fevers, cough productive of yellow sputum, and pleuritic chest pain  Clinical Impression  Pt admitted with/for generalized weakness and above s/s of pna.  Pt currently limited functionally due to the problems listed below.  (see problems list.)  Pt will benefit from PT to maximize function and safety to be able to get home safely with available assist.     Follow Up Recommendations Home health PT    Equipment Recommendations       Recommendations for Other Services       Precautions / Restrictions Precautions Precautions: Fall      Mobility  Bed Mobility Overal bed mobility: Needs Assistance Bed Mobility: Supine to Sit;Sit to Supine     Supine to sit: Mod assist Sit to supine: Mod assist   General bed mobility comments: generally effortful and painful. needed trunk assist to get up and leg assist to get down in bed.  Transfers Overall transfer level: Needs assistance   Transfers: Sit to/from Stand;Stand Pivot Transfers Sit to Stand: Min guard Stand pivot transfers: Min assist       General transfer comment: effortful, slow, pt making sure she has as much assist from stationary objects as possible  Ambulation/Gait             General Gait Details: deferred by patient due to pain.  Stairs            Wheelchair Mobility    Modified Rankin (Stroke Patients Only)       Balance Overall balance assessment: Needs assistance Sitting-balance support: Single extremity supported;Bilateral upper extremity supported Sitting balance-Leahy Scale: Fair Sitting  balance - Comments: less painful with assist   Standing balance support: Bilateral upper extremity supported;Single extremity supported Standing balance-Leahy Scale: Poor Standing balance comment: makes sure to have a hold to something stationary                             Pertinent Vitals/Pain Pain Assessment: Faces Faces Pain Scale: Hurts even more Pain Location: back down right leg. Pain Descriptors / Indicators: Shooting;Grimacing;Moaning Pain Intervention(s): Monitored during session;Repositioned    Home Living Family/patient expects to be discharged to:: Private residence Living Arrangements: Alone Available Help at Discharge: Family;Available PRN/intermittently;Personal care attendant Type of Home: House Home Access: Stairs to enter Entrance Stairs-Rails: None Entrance Stairs-Number of Steps: 1 Home Layout: One level Home Equipment: Cane - single point;Shower seat;Walker - 4 wheels Additional Comments: States her children assist her with groceries, shopping    Prior Function Level of Independence: Independent with assistive device(s)         Comments: Use of RW for minimal ambulation - reports walks to and from bathroom, able to make simple meals and sit at dining room chair for 10-20 mins before needing to lay back down..  pt reports RW doesn't go into her bedroom and bath.     Hand Dominance   Dominant Hand: Right    Extremity/Trunk Assessment        Lower Extremity Assessment Lower Extremity Assessment: Generalized weakness;RLE deficits/detail RLE Deficits / Details: will bear weight,  but otherwise pt has weak hip flexors, ab/abd/hams. RLE Coordination: decreased fine motor;decreased gross motor       Communication   Communication: No difficulties  Cognition Arousal/Alertness: Awake/alert Behavior During Therapy: WFL for tasks assessed/performed Overall Cognitive Status: Within Functional Limits for tasks assessed                       General Comments      Exercises     Assessment/Plan    PT Assessment Patient needs continued PT services  PT Problem List Decreased strength;Decreased activity tolerance;Decreased range of motion;Decreased balance;Decreased mobility;Pain          PT Treatment Interventions DME instruction;Gait training;Functional mobility training;Therapeutic activities;Balance training;Patient/family education    PT Goals (Current goals can be found in the Care Plan section)  Acute Rehab PT Goals Patient Stated Goal: feel better, less pain, stop falling PT Goal Formulation: With patient Time For Goal Achievement: 02/28/16 Potential to Achieve Goals: Fair    Frequency Min 3X/week   Barriers to discharge   has PCA 3 hrs/day and children PRN    Co-evaluation               End of Session   Activity Tolerance: Patient limited by pain Patient left: in bed;with call bell/phone within reach;with bed alarm set Nurse Communication: Mobility status    Functional Assessment Tool Used: clinical judgement Functional Limitation: Mobility: Walking and moving around Mobility: Walking and Moving Around Current Status ([id number]): At least 20 percent but less than 40 percent impaired, limited or restricted Mobility: Walking and Moving Around Goal Status [phone]): At least 1 percent but less than 20 percent impaired, limited or restricted    Time: 1625-1715 PT Time Calculation (min) (ACUTE ONLY): 50 min   Charges:   PT Evaluation $PT Eval Moderate Complexity: 1 Procedure PT Treatments $Therapeutic Activity: 23-37 mins   PT G Codes:   PT G-Codes **NOT FOR INPATIENT CLASS** Functional Assessment Tool Used: clinical judgement Functional Limitation: Mobility: Walking and moving around Mobility: Walking and Moving Around Current Status (G2542): At least 20 percent but less than 40 percent impaired, limited or restricted Mobility: Walking and Moving Around Goal Status [phone]): At least 1  percent but less than 20 percent impaired, limited or restricted    [name] 02/14/2016, 5:39 PM 02/14/2016  Donnella Sham, [place] [phone]  (pager)

## 2016-02-14 NOTE — Progress Notes (Signed)
PROGRESS NOTE  [name] MBT:[id number] DOB: 12-Aug-1955 DOA: 02/13/2016 PCP: [name], MD  HPI/Recap of past 24 hours:  Feeling better, report less sob, less confused  Assessment/Plan: Principal Problem:   CAP (community acquired pneumonia) Active Problems:   Hypothyroidism   Obesity   Essential hypertension   Recurrent falls   Type II diabetes mellitus ([place])   OSA on CPAP   Edema   AKI (acute kidney injury) ([place])   Sepsis secondary to multifocal pneumonia, with mild AKI. With confusion, with sinus tachycardia, borderline blood pressure, No oxygen requirement at rest --Continue Rocephin and azithromycin --Blood no growth, sputum cultures pending collection --respiratory viral panel negative --Urine legionella in process,  streptococcal antigens negative --Repeat BMP, no maintenance fluids for now since patient has edema  Edema --check BNP, echo --One time dose of IV lasix now, will need more lasix once bp stable --Strict I/O --Daily weights  HTN, now relative hypotension --Hold valsartan, HCT  DM --Hold oral meds --Continue levemir, SSI  OSA --CPAP  Hypothyroid --Levothyroxine  Chronic pain --Nucynta, oxycodone for breakthrough  Recent falls --Consider PT eval and treat  Morbid obesity Body mass index is 56.61 kg/m.   DVT prophylaxis: Lovenox Code Status: FULL Family Communication: Patient alone in the ED at the time of admission. Disposition Plan: home health vs snf Consults called: NONE   Procedures:  none  Antibiotics:  Rocephin/zitrho   Objective: BP (!) 103/52 (BP Location: Right Arm)   Pulse (!) 103   Temp 98.4 F (36.9 C) (Oral)   Resp 16   Ht _0  (1.651 m)   Wt (!) 154.3 kg (340 lb 3.2 oz)   SpO2 93%   BMI 56.61 kg/m   Intake/Output Summary (Last 24 hours) at 02/14/16 0831 Last data filed at 02/14/16 0630  Gross per 24 hour  Intake              300 ml  Output             1250 ml  Net              -950 ml   Filed Weights   02/13/16 2138 02/13/16 2342  Weight: (!) 147.4 kg (325 lb) (!) 154.3 kg (340 lb 3.2 oz)    Exam:   General:  Obese, drowsy  Cardiovascular: RRR  Respiratory: diminished  Abdomen: Soft/ND/NT, positive BS  Musculoskeletal: 3+bilateral pitting Edema  Neuro: drowsy, drift back to sleep during conversation  Data Reviewed: Basic Metabolic Panel:  Recent Labs Lab 02/13/16 1905 02/14/16 0531  NA 137 140  K 3.9 3.8  CL 103 108  CO2 21* 24  GLUCOSE 119* 138*  BUN 18 20  CREATININE 1.45* 1.44*  CALCIUM 9.3 8.7*   Liver Function Tests:  Recent Labs Lab 02/13/16 1905  AST 27  ALT 23  ALKPHOS 74  BILITOT 0.1*  PROT 6.4*  ALBUMIN 3.2*    Recent Labs Lab 02/13/16 1905  LIPASE 13   No results for input(s): AMMONIA in the last 168 hours. CBC:  Recent Labs Lab 02/13/16 1905 02/14/16 0531  WBC 12.4* 9.0  NEUTROABS 9.3*  --   HGB 8.9* 7.4*  HCT 27.9* 23.4*  MCV 84.5 85.4  PLT 251 206   Cardiac Enzymes:   No results for input(s): CKTOTAL, CKMB, CKMBINDEX, TROPONINI in the last 168 hours. BNP (last 3 results)  Recent Labs  06/23/15 1333 02/14/16 0531  BNP 162.2* 36.1    ProBNP (last 3  results) No results for input(s): PROBNP in the last 8760 hours.  CBG:  Recent Labs Lab 02/13/16 2345 02/14/16 0754  GLUCAP 127* 125*    No results found for this or any previous visit (from the past 240 hour(s)).   Studies: Dg Chest 2 View  Result Date: 02/13/2016 CLINICAL DATA:  Cough and congestion. Weakness and body aches for 3 days. EXAM: CHEST  2 VIEW COMPARISON:  06/24/2015 FINDINGS: AP and lateral views of the chest show borderline cardiomegaly. Vascular congestion noted with probable underlying chronic interstitial changes. Patchy airspace disease noted at the lung bases likely atelectatic although pneumonia not excluded. Surgical clips right lower neck suggest hemithyroidectomy. IMPRESSION: Borderline cardiomegaly with  bibasilar atelectasis/ infiltrate. Electronically Signed   By: Misty Stanley M.D.   On: 02/13/2016 19:54    Scheduled Meds: . aspirin EC  81 mg Oral Daily  . azithromycin  500 mg Intravenous Q24H  . cefTRIAXone (ROCEPHIN)  IV  1 g Intravenous Q24H  . doxepin  50 mg Oral QHS  . enoxaparin (LOVENOX) injection  80 mg Subcutaneous Daily  . [START ON 02/15/2016] Influenza vac split quadrivalent PF  0.5 mL Intramuscular Tomorrow-1000  . insulin aspart  0-15 Units Subcutaneous TID WC  . insulin detemir  10 Units Subcutaneous QHS  . levothyroxine  175 mcg Oral QAC breakfast  . LORazepam  1 mg Oral Daily   And  . LORazepam  2 mg Oral QHS  . pantoprazole  40 mg Oral Daily  . [START ON 02/15/2016] pneumococcal 23 valent vaccine  0.5 mL Intramuscular Tomorrow-1000  . pregabalin  100 mg Oral BID  . sodium chloride  1,000 mL Intravenous Once   And  . sodium chloride  500 mL Intravenous Once  . tapentadol  100 mg Oral 5 X Daily  . valACYclovir  1,000 mg Oral BID  . venlafaxine XR  150 mg Oral BID WC  . ziprasidone  40 mg Oral QPC supper    Continuous Infusions:   Time spent: 34mns from 10am to 10:35am  Tycen Dockter MD, PhD  [facility] Pager [phone] If 7PM-7AM, please contact night-coverage at [email or web address], password [facility]12/28/2017, 8:31 AM  LOS: 0 days

## 2016-02-15 DIAGNOSIS — I5033 Acute on chronic diastolic (congestive) heart failure: Secondary | ICD-10-CM

## 2016-02-15 DIAGNOSIS — E119 Type 2 diabetes mellitus without complications: Secondary | ICD-10-CM

## 2016-02-15 DIAGNOSIS — Z794 Long term (current) use of insulin: Secondary | ICD-10-CM

## 2016-02-15 LAB — URINE CULTURE

## 2016-02-15 LAB — GLUCOSE, CAPILLARY
GLUCOSE-CAPILLARY: 112 mg/dL — AB (ref 65–99)
Glucose-Capillary: 101 mg/dL — ABNORMAL HIGH (ref 65–99)
Glucose-Capillary: 101 mg/dL — ABNORMAL HIGH (ref 65–99)
Glucose-Capillary: 128 mg/dL — ABNORMAL HIGH (ref 65–99)

## 2016-02-15 LAB — BASIC METABOLIC PANEL
ANION GAP: 8 (ref 5–15)
BUN: 14 mg/dL (ref 6–20)
CALCIUM: 9 mg/dL (ref 8.9–10.3)
CO2: 25 mmol/L (ref 22–32)
CREATININE: 1.11 mg/dL — AB (ref 0.44–1.00)
Chloride: 108 mmol/L (ref 101–111)
GFR calc Af Amer: >60 mL/min (ref >60)
GFR calc non Af Amer: 53 mL/min — ABNORMAL LOW (ref >60)
GLUCOSE: 102 mg/dL — AB (ref 65–99)
Potassium: 4.1 mmol/L (ref 3.5–5.1)
Sodium: 141 mmol/L (ref 135–145)

## 2016-02-15 LAB — CBC
HCT: 22.9 % — ABNORMAL LOW (ref 36.0–46.0)
Hemoglobin: 7.2 g/dL — ABNORMAL LOW (ref 12.0–15.0)
MCH: 26.7 pg (ref 26.0–34.0)
MCHC: 31.4 g/dL (ref 30.0–36.0)
MCV: 84.8 fL (ref 78.0–100.0)
PLATELETS: 213 10*3/uL (ref 150–400)
RBC: 2.7 MIL/uL — ABNORMAL LOW (ref 3.87–5.11)
RDW: 17.5 % — AB (ref 11.5–15.5)
WBC: 8.7 10*3/uL (ref 4.0–10.5)

## 2016-02-15 LAB — LEGIONELLA PNEUMOPHILA SEROGP 1 UR AG: L. pneumophila Serogp 1 Ur Ag: NEGATIVE

## 2016-02-15 MED ORDER — IBUPROFEN 600 MG PO TABS
600.0000 mg | ORAL_TABLET | Freq: Four times a day (QID) | ORAL | Status: DC | PRN
  Administered 2016-02-15: 600 mg via ORAL
  Filled 2016-02-15: qty 1

## 2016-02-15 MED ORDER — ALUM & MAG HYDROXIDE-SIMETH 200-200-20 MG/5ML PO SUSP
30.0000 mL | Freq: Four times a day (QID) | ORAL | Status: DC | PRN
  Administered 2016-02-15: 30 mL via ORAL
  Filled 2016-02-15: qty 30

## 2016-02-15 MED ORDER — LEVOFLOXACIN 750 MG PO TABS
750.0000 mg | ORAL_TABLET | Freq: Every day | ORAL | Status: DC
  Administered 2016-02-15 – 2016-02-17 (×3): 750 mg via ORAL
  Filled 2016-02-15 (×3): qty 1

## 2016-02-15 MED ORDER — DOXYCYCLINE HYCLATE 100 MG PO TABS: 100.0000 mg | ORAL_TABLET | Freq: Two times a day (BID) | ORAL | Status: DC

## 2016-02-15 MED ORDER — SENNOSIDES-DOCUSATE SODIUM 8.6-50 MG PO TABS
2.0000 | ORAL_TABLET | Freq: Two times a day (BID) | ORAL | Status: DC
  Administered 2016-02-15 – 2016-02-17 (×3): 2 via ORAL
  Filled 2016-02-15 (×4): qty 2

## 2016-02-15 MED ORDER — LEVOTHYROXINE SODIUM 75 MCG PO TABS
150.0000 ug | ORAL_TABLET | Freq: Every day | ORAL | Status: DC
  Administered 2016-02-16 – 2016-02-17 (×2): 150 ug via ORAL
  Filled 2016-02-15 (×2): qty 2

## 2016-02-15 MED ORDER — FUROSEMIDE 10 MG/ML IJ SOLN
40.0000 mg | Freq: Every day | INTRAMUSCULAR | Status: DC
  Administered 2016-02-15 – 2016-02-17 (×3): 40 mg via INTRAVENOUS
  Filled 2016-02-15 (×3): qty 4

## 2016-02-15 MED ORDER — POLYETHYLENE GLYCOL 3350 17 G PO PACK
17.0000 g | PACK | Freq: Every day | ORAL | Status: DC
  Administered 2016-02-15 – 2016-02-17 (×3): 17 g via ORAL
  Filled 2016-02-15 (×3): qty 1

## 2016-02-15 NOTE — Progress Notes (Addendum)
PROGRESS NOTE  [name] RKY:[id number] DOB: May 15, 1955 DOA: 02/13/2016 PCP: [name], MD  HPI/Recap of past 24 hours:  Feeling better, confusion has resolved, on room air at rest, tmax 100  Blood pressure has improved  Assessment/Plan: Principal Problem:   CAP (community acquired pneumonia) Active Problems:   Hypothyroidism   Obesity   Essential hypertension   Recurrent falls   Type II diabetes mellitus ([place])   OSA on CPAP   Edema   AKI (acute kidney injury) ([place])   Pneumonia   Sepsis secondary to multifocal pneumonia, with mild AKI. With metabolic encephalopathy, with sinus tachycardia, borderline blood pressure on presentation.  ---Blood no growth, sputum not collected, respiratory viral panel negative, Urine legionella negative,  streptococcal antigens negative --she was treated with Rocephin and azithromycin since admission with clinical improvement, will change to oral levaquin to minimize volume.  Edema/acute on chronic diastolic chf exacerbation --BNP not elevated, though bnp not reliable in obese patient,  -echo with adequate lvef, venous doppler no DVT. ---strict intake and output, Daily weights -schedule iv lasix, now blood pressure has improved  HTN, now relative hypotension, home bp meds held  Insulin dependent DM --Hold oral meds --Continue levemir, SSI   Hypothyroid --reduce Levothyroxine (tsh 0.2)   Anemia: seems chronic, hgb close to baseline Check iron studies/b12/folate/retic and FOBT  Chronic pain --Nucynta, oxycodone for breakthrough  Recent falls -- PT eval and treat  OSA --CPAP Morbid obesity Body mass index is 56.61 kg/m.   Constipation: stool  softener   DVT prophylaxis: Lovenox Code Status: FULL Family Communication: Patient  Disposition Plan: home health vs snf Consults called: NONE   Procedures:  none  Antibiotics:  Rocephin/zithro from admission to 12/29  levaquin from  12/29   Objective: BP 129/68 (BP Location: Left Wrist)   Pulse (!) 102   Temp 98.2 F (36.8 C) (Oral)   Resp 19   Ht 5' 5" (1.651 m)   Wt (!) 154.3 kg (340 lb 3.2 oz)   SpO2 94%   BMI 56.61 kg/m   Intake/Output Summary (Last 24 hours) at 02/15/16 1530 Last data filed at 02/15/16 1432  Gross per 24 hour  Intake              650 ml  Output             4850 ml  Net            -4200 ml   Filed Weights   02/13/16 2138 02/13/16 2342  Weight: (!) 147.4 kg (325 lb) (!) 154.3 kg (340 lb 3.2 oz)    Exam:   General:  Obese, fully alert today  Cardiovascular: RRR  Respiratory: diminished  Abdomen: Soft/ND/NT, positive BS  Musculoskeletal: less bilateral pitting Edema  Neuro: aaox3  Data Reviewed: Basic Metabolic Panel:  Recent Labs Lab 02/13/16 1905 02/14/16 0531 02/15/16 0754  NA 137 140 141  K 3.9 3.8 4.1  CL 103 108 108  CO2 21* 24 25  GLUCOSE 119* 138* 102*  BUN _0 CREATININE 1.45* 1.44* 1.11*  CALCIUM 9.3 8.7* 9.0   Liver Function Tests:  Recent Labs Lab 02/13/16 1905  AST 27  ALT 23  ALKPHOS 74  BILITOT 0.1*  PROT 6.4*  ALBUMIN 3.2*    Recent Labs Lab 02/13/16 1905  LIPASE 13   No results for input(s): AMMONIA in the last 168 hours. CBC:  Recent Labs Lab 02/13/16 1905 02/14/16 0531 02/15/16 0754  WBC  12.4* 9.0 8.7  NEUTROABS 9.3*  --   --   HGB 8.9* 7.4* 7.2*  HCT 27.9* 23.4* 22.9*  MCV 84.5 85.4 84.8  PLT 251 206 213   Cardiac Enzymes:   No results for input(s): CKTOTAL, CKMB, CKMBINDEX, TROPONINI in the last 168 hours. BNP (last 3 results)  Recent Labs  06/23/15 1333 02/14/16 0531  BNP 162.2* 36.1    ProBNP (last 3 results) No results for input(s): PROBNP in the last 8760 hours.  CBG:  Recent Labs Lab 02/14/16 1217 02/14/16 1710 02/14/16 2143 02/15/16 0754 02/15/16 1249  GLUCAP 158* 82 130* 101* 101*    Recent Results (from the past 240 hour(s))  Respiratory Panel by PCR     Status: None    Collection Time: 02/13/16  7:03 PM  Result Value Ref Range Status   Adenovirus NOT DETECTED NOT DETECTED Final   Coronavirus 229E NOT DETECTED NOT DETECTED Final   Coronavirus HKU1 NOT DETECTED NOT DETECTED Final   Coronavirus NL63 NOT DETECTED NOT DETECTED Final   Coronavirus OC43 NOT DETECTED NOT DETECTED Final   Metapneumovirus NOT DETECTED NOT DETECTED Final   Rhinovirus / Enterovirus NOT DETECTED NOT DETECTED Final   Influenza A NOT DETECTED NOT DETECTED Final   Influenza B NOT DETECTED NOT DETECTED Final   Parainfluenza Virus 1 NOT DETECTED NOT DETECTED Final   Parainfluenza Virus 2 NOT DETECTED NOT DETECTED Final   Parainfluenza Virus 3 NOT DETECTED NOT DETECTED Final   Parainfluenza Virus 4 NOT DETECTED NOT DETECTED Final   Respiratory Syncytial Virus NOT DETECTED NOT DETECTED Final   Bordetella pertussis NOT DETECTED NOT DETECTED Final   Chlamydophila pneumoniae NOT DETECTED NOT DETECTED Final   Mycoplasma pneumoniae NOT DETECTED NOT DETECTED Final  Culture, blood (Routine X 2) w Reflex to ID Panel     Status: None (Preliminary result)   Collection Time: 02/13/16  8:09 PM  Result Value Ref Range Status   Specimen Description BLOOD RIGHT ANTECUBITAL  Final   Special Requests BOTTLES DRAWN AEROBIC AND ANAEROBIC 5CC  Final   Culture NO GROWTH 2 DAYS  Final   Report Status PENDING  Incomplete  Culture, blood (Routine X 2) w Reflex to ID Panel     Status: None (Preliminary result)   Collection Time: 02/13/16 10:25 PM  Result Value Ref Range Status   Specimen Description BLOOD RIGHT ARM  Final   Special Requests BOTTLES DRAWN AEROBIC AND ANAEROBIC 5ML  Final   Culture NO GROWTH 2 DAYS  Final   Report Status PENDING  Incomplete  MRSA PCR Screening     Status: None   Collection Time: 02/14/16 10:26 AM  Result Value Ref Range Status   MRSA by PCR NEGATIVE NEGATIVE Final    Comment:        The GeneXpert MRSA Assay (FDA approved for NASAL specimens only), is one component of  a comprehensive MRSA colonization surveillance program. It is not intended to diagnose MRSA infection nor to guide or monitor treatment for MRSA infections.   Urine culture     Status: Abnormal   Collection Time: 02/14/16  1:43 PM  Result Value Ref Range Status   Specimen Description URINE, RANDOM  Final   Special Requests NONE  Final   Culture MULTIPLE SPECIES PRESENT, SUGGEST RECOLLECTION (A)  Final   Report Status 02/15/2016 FINAL  Final     Studies: No results found.  Scheduled Meds: . aspirin EC  81 mg Oral Daily  . doxepin  50 mg Oral QHS  . enoxaparin (LOVENOX) injection  80 mg Subcutaneous Daily  . fluticasone  1 spray Each Nare Daily  . furosemide  40 mg Intravenous Daily  . guaiFENesin  600 mg Oral BID  . Influenza vac split quadrivalent PF  0.5 mL Intramuscular Tomorrow-1000  . insulin aspart  0-15 Units Subcutaneous TID WC  . insulin detemir  10 Units Subcutaneous QHS  . levofloxacin  750 mg Oral Daily  . levothyroxine  175 mcg Oral QAC breakfast  . LORazepam  1 mg Oral Daily   And  . LORazepam  2 mg Oral QHS  . pantoprazole  40 mg Oral Daily  . pneumococcal 23 valent vaccine  0.5 mL Intramuscular Tomorrow-1000  . pregabalin  100 mg Oral BID  . senna-docusate  2 tablet Oral BID  . sodium chloride  1,000 mL Intravenous Once   And  . sodium chloride  500 mL Intravenous Once  . tapentadol  100 mg Oral 5 X Daily  . valACYclovir  1,000 mg Oral BID  . venlafaxine XR  150 mg Oral BID WC  . ziprasidone  40 mg Oral QPC supper    Continuous Infusions:   Time spent: 14mns   Yasuo Phimmasone MD, PhD  [facility] Pager [phone] If 7PM-7AM, please contact night-coverage at [email or web address], password [facility]12/29/2017, 3:30 PM  LOS: 1 day

## 2016-02-15 NOTE — Progress Notes (Addendum)
Physical Therapy Treatment Patient Details Name: [name] MRN: [id number] DOB: 1955/08/10 Today's Date: 02/15/2016    History of Present Illness  [name] is a 60 y.o. woman with multiple comorbidities including DM, hypothyroidism, OSA on CPAP, morbid obesity, HTN, HLD, CKD 3, and breast cancer who presents to the ED for evaluation of  4-5 days of generalized weakness, subjective fevers, cough productive of yellow sputum, and pleuritic chest pain    PT Comments    Patient making slow improvement with mobility.  Patient has Aide from 11:00 - 2:30, 7 days/week.  Is alone remainder of time.  Discussed with patient PT's concerns about her safety being alone, and recommended ST-SNF for continued therapy.  Patient declined SNF, reporting that "this is how I usually move".  Continue to recommend HHPT for continued therapy at d/c.   Follow Up Recommendations  Home health PT;Supervision - Intermittent  (declined SNF)     Equipment Recommendations  None recommended by PT    Recommendations for Other Services       Precautions / Restrictions Precautions Precautions: Fall Precaution Comments: Mult falls Restrictions Weight Bearing Restrictions: No    Mobility  Bed Mobility Overal bed mobility: Needs Assistance Bed Mobility: Rolling;Sidelying to Sit;Sit to Supine Rolling: Min guard Sidelying to sit: Mod assist;+2 for safety/equipment   Sit to supine: Min guard;HOB elevated   General bed mobility comments: Verbal cues to try rolling technique.  Assist to bring trunk to upright position.  Patient with fair sitting balance.   Patient able to bring LE's onto bed one at a time into long sitting position.  Then moved trunk to supine.  Transfers Overall transfer level: Needs assistance Equipment used: 4-wheeled walker Transfers: Sit to/from Stand Sit to Stand: Min guard;+2 safety/equipment         General transfer comment: Patient with slow transition to standing with  UE support at different heights causing trunk rotation.  Encouraged patient to attempt to keep trunk straight to decrease pain.    Ambulation/Gait Ambulation/Gait assistance: Min guard;+2 safety/equipment Ambulation Distance (Feet): 30 Feet Assistive device: 4-wheeled walker Gait Pattern/deviations: Step-through pattern;Decreased step length - right;Decreased step length - left;Decreased stride length;Shuffle;Trunk flexed;Wide base of support Gait velocity: decreased Gait velocity interpretation: Below normal speed for age/gender General Gait Details: Verbal cues for safe use of rollator, to keep feet within back legs of walker.  Patient with very flexed posture, relying heavily on rollator.  Cues to try to stand upright.  Patient with very slow, painful gait.   Stairs            Wheelchair Mobility    Modified Rankin (Stroke Patients Only)       Balance Overall balance assessment: Needs assistance         Standing balance support: Bilateral upper extremity supported;During functional activity Standing balance-Leahy Scale: Poor Standing balance comment: Requires UE support for standing balance.                    Cognition Arousal/Alertness: Awake/alert Behavior During Therapy: WFL for tasks assessed/performed Overall Cognitive Status: Within Functional Limits for tasks assessed                      Exercises      General Comments        Pertinent Vitals/Pain Pain Assessment: 0-10 Pain Score: 10-Worst pain ever (Back-10;  Feet-8;   Headache-5) Pain Location: Back and feet during gait; Headache Pain Descriptors / Indicators:  Shooting;Grimacing;Moaning;Headache Pain Intervention(s): Limited activity within patient's tolerance;Monitored during session;Repositioned;Patient requesting pain meds-RN notified    Home Living                      Prior Function            PT Goals (current goals can now be found in the care plan section)  Acute Rehab PT Goals Patient Stated Goal: feel better, less pain, stop falling Progress towards PT goals: Progressing toward goals    Frequency    Min 3X/week      PT Plan Current plan remains appropriate    Co-evaluation             End of Session Equipment Utilized During Treatment: Gait belt Activity Tolerance: Patient limited by fatigue;Patient limited by pain Patient left: in bed;with call bell/phone within reach;with bed alarm set     Time: 1447-1510 PT Time Calculation (min) (ACUTE ONLY): 23 min  Charges:  $Gait Training: 8-22 mins $Therapeutic Activity: 8-22 mins                    G Codes:      [name] 03-10-16, 6:07 PM Carita Pian. Sanjuana Kava, [place] Pager [phone]

## 2016-02-16 LAB — BASIC METABOLIC PANEL
ANION GAP: 9 (ref 5–15)
BUN: 14 mg/dL (ref 6–20)
CHLORIDE: 103 mmol/L (ref 101–111)
CO2: 28 mmol/L (ref 22–32)
Calcium: 9.1 mg/dL (ref 8.9–10.3)
Creatinine, Ser: 1.14 mg/dL — ABNORMAL HIGH (ref 0.44–1.00)
GFR calc Af Amer: 60 mL/min — ABNORMAL LOW (ref >60)
GFR, EST NON AFRICAN AMERICAN: 52 mL/min — AB (ref >60)
GLUCOSE: 99 mg/dL (ref 65–99)
POTASSIUM: 3.9 mmol/L (ref 3.5–5.1)
Sodium: 140 mmol/L (ref 135–145)

## 2016-02-16 LAB — GLUCOSE, CAPILLARY
GLUCOSE-CAPILLARY: 99 mg/dL (ref 65–99)
Glucose-Capillary: 128 mg/dL — ABNORMAL HIGH (ref 65–99)
Glucose-Capillary: 113 mg/dL — ABNORMAL HIGH (ref 65–99)
Glucose-Capillary: 104 mg/dL — ABNORMAL HIGH (ref 65–99)

## 2016-02-16 LAB — IRON AND TIBC
Iron: 12 ug/dL — ABNORMAL LOW (ref 28–170)
SATURATION RATIOS: 3 % — AB (ref 10.4–31.8)
TIBC: 349 ug/dL (ref 250–450)
UIBC: 337 ug/dL

## 2016-02-16 LAB — CBC
HEMATOCRIT: 22.3 % — AB (ref 36.0–46.0)
HEMOGLOBIN: 7.2 g/dL — AB (ref 12.0–15.0)
MCH: 27 pg (ref 26.0–34.0)
MCHC: 32.3 g/dL (ref 30.0–36.0)
MCV: 83.5 fL (ref 78.0–100.0)
PLATELETS: 219 10*3/uL (ref 150–400)
RBC: 2.67 MIL/uL — AB (ref 3.87–5.11)
RDW: 17.1 % — ABNORMAL HIGH (ref 11.5–15.5)
WBC: 9.6 10*3/uL (ref 4.0–10.5)

## 2016-02-16 LAB — RETICULOCYTES
RBC.: 2.67 MIL/uL — AB (ref 3.87–5.11)
RETIC COUNT ABSOLUTE: 61.4 10*3/uL (ref 19.0–186.0)
Retic Ct Pct: 2.3 % (ref 0.4–3.1)

## 2016-02-16 LAB — FOLATE: Folate: 22.8 ng/mL (ref >5.9)

## 2016-02-16 LAB — MAGNESIUM: Magnesium: 1.8 mg/dL (ref 1.7–2.4)

## 2016-02-16 LAB — VITAMIN B12: Vitamin B-12: 356 pg/mL (ref 180–914)

## 2016-02-16 MED ORDER — FUROSEMIDE 10 MG/ML IJ SOLN
40.0000 mg | Freq: Once | INTRAMUSCULAR | Status: DC
Start: 2016-02-16 — End: 2016-02-16

## 2016-02-16 NOTE — Progress Notes (Signed)
Patient states she will have RN contact RT when ready for Cpap

## 2016-02-16 NOTE — Progress Notes (Signed)
PROGRESS NOTE  [name] EOF:[id number] DOB: 08-Jun-1955 DOA: 02/13/2016 PCP: [name], MD  HPI/Recap of past 24 hours:  Continue to have low grade fever, tmax 100.3,  bp better, Edema is improving Feeling better, confusion has resolved, on room air at rest,   Assessment/Plan: Principal Problem:   CAP (community acquired pneumonia) Active Problems:   Hypothyroidism   Obesity   Essential hypertension   Recurrent falls   Type II diabetes mellitus ([place])   OSA on CPAP   Edema   AKI (acute kidney injury) ([place])   Pneumonia   Sepsis secondary to multifocal pneumonia, with mild AKI. With metabolic encephalopathy, with sinus tachycardia, borderline blood pressure on presentation.  ---Blood no growth, sputum not collected, respiratory viral panel negative, Urine legionella negative,  streptococcal antigens negative --she was treated with Rocephin and azithromycin since admission with clinical improvement, will change to oral levaquin to minimize volume. she continue to have Low grade fever,though clinically she looks better,  continue abx, incentive spirometer, up to chair  Edema/acute on chronic diastolic chf exacerbation --BNP not elevated, though bnp not reliable in obese patient,  -echo with adequate lvef, venous doppler no DVT. ---strict intake and output, Daily weights -schedule iv lasix, now blood pressure has improved   HTN, now relative hypotension, home bp meds held  Insulin dependent DM --Hold oral meds --Continue levemir, SSI   Hypothyroid --reduce Levothyroxine (tsh 0.2)   Anemia: seems chronic, hgb close to baseline Check iron studies/b12/folate/retic and FOBT  Chronic pain --Nucynta, oxycodone for breakthrough  Recent falls -- PT eval and treat  OSA --CPAP Morbid obesity Body mass index is 58.91 kg/m.   Constipation: stool  softener   DVT prophylaxis: Lovenox Code Status: FULL Family Communication: Patient  Disposition  Plan: home health, patient refused snf Consults called: NONE   Procedures:  none  Antibiotics:  Rocephin/zithro from admission to 12/29  levaquin from 12/29   Objective: BP (!) 157/82 (BP Location: Right Wrist)   Pulse (!) 103   Temp 100.3 F (37.9 C) (Axillary)   Resp 20   Ht _0  (1.651 m)   Wt (!) 160.6 kg (354 lb)   SpO2 95%   BMI 58.91 kg/m   Intake/Output Summary (Last 24 hours) at 02/16/16 0910 Last data filed at 02/16/16 0910  Gross per 24 hour  Intake              840 ml  Output             4500 ml  Net            -3660 ml   Filed Weights   02/13/16 2342 02/15/16 2052 02/15/16 2123  Weight: (!) 154.3 kg (340 lb 3.2 oz) (!) 160.1 kg (353 lb) (!) 160.6 kg (354 lb)    Exam:   General:  Obese, fully alert today  Cardiovascular: RRR  Respiratory: diminished  Abdomen: Soft/ND/NT, positive BS  Musculoskeletal: less bilateral pitting Edema  Neuro: aaox3  Data Reviewed: Basic Metabolic Panel:  Recent Labs Lab 02/13/16 1905 02/14/16 0531 02/15/16 0754 02/16/16 0600  NA 137 140 141 140  K 3.9 3.8 4.1 3.9  CL 103 108 108 103  CO2 21* _1 GLUCOSE 119* 138* 102* 99  BUN _2 CREATININE 1.45* 1.44* 1.11* 1.14*  CALCIUM 9.3 8.7* 9.0 9.1  MG  --   --   --  1.8   Liver Function Tests:  Recent Labs  Lab 02/13/16 1905  AST 27  ALT 23  ALKPHOS 74  BILITOT 0.1*  PROT 6.4*  ALBUMIN 3.2*    Recent Labs Lab 02/13/16 1905  LIPASE 13   No results for input(s): AMMONIA in the last 168 hours. CBC:  Recent Labs Lab 02/13/16 1905 02/14/16 0531 02/15/16 0754 02/16/16 0600  WBC 12.4* 9.0 8.7 9.6  NEUTROABS 9.3*  --   --   --   HGB 8.9* 7.4* 7.2* 7.2*  HCT 27.9* 23.4* 22.9* 22.3*  MCV 84.5 85.4 84.8 83.5  PLT 251 206 213 219   Cardiac Enzymes:   No results for input(s): CKTOTAL, CKMB, CKMBINDEX, TROPONINI in the last 168 hours. BNP (last 3 results)  Recent Labs  06/23/15 1333 02/14/16 0531  BNP 162.2* 36.1     ProBNP (last 3 results) No results for input(s): PROBNP in the last 8760 hours.  CBG:  Recent Labs Lab 02/15/16 0754 02/15/16 1249 02/15/16 1725 02/15/16 2125 02/16/16 0830  GLUCAP 101* 101* 112* 128* 99    Recent Results (from the past 240 hour(s))  Respiratory Panel by PCR     Status: None   Collection Time: 02/13/16  7:03 PM  Result Value Ref Range Status   Adenovirus NOT DETECTED NOT DETECTED Final   Coronavirus 229E NOT DETECTED NOT DETECTED Final   Coronavirus HKU1 NOT DETECTED NOT DETECTED Final   Coronavirus NL63 NOT DETECTED NOT DETECTED Final   Coronavirus OC43 NOT DETECTED NOT DETECTED Final   Metapneumovirus NOT DETECTED NOT DETECTED Final   Rhinovirus / Enterovirus NOT DETECTED NOT DETECTED Final   Influenza A NOT DETECTED NOT DETECTED Final   Influenza B NOT DETECTED NOT DETECTED Final   Parainfluenza Virus 1 NOT DETECTED NOT DETECTED Final   Parainfluenza Virus 2 NOT DETECTED NOT DETECTED Final   Parainfluenza Virus 3 NOT DETECTED NOT DETECTED Final   Parainfluenza Virus 4 NOT DETECTED NOT DETECTED Final   Respiratory Syncytial Virus NOT DETECTED NOT DETECTED Final   Bordetella pertussis NOT DETECTED NOT DETECTED Final   Chlamydophila pneumoniae NOT DETECTED NOT DETECTED Final   Mycoplasma pneumoniae NOT DETECTED NOT DETECTED Final  Culture, blood (Routine X 2) w Reflex to ID Panel     Status: None (Preliminary result)   Collection Time: 02/13/16  8:09 PM  Result Value Ref Range Status   Specimen Description BLOOD RIGHT ANTECUBITAL  Final   Special Requests BOTTLES DRAWN AEROBIC AND ANAEROBIC 5CC  Final   Culture NO GROWTH 2 DAYS  Final   Report Status PENDING  Incomplete  Culture, blood (Routine X 2) w Reflex to ID Panel     Status: None (Preliminary result)   Collection Time: 02/13/16 10:25 PM  Result Value Ref Range Status   Specimen Description BLOOD RIGHT ARM  Final   Special Requests BOTTLES DRAWN AEROBIC AND ANAEROBIC 5ML  Final   Culture NO  GROWTH 2 DAYS  Final   Report Status PENDING  Incomplete  MRSA PCR Screening     Status: None   Collection Time: 02/14/16 10:26 AM  Result Value Ref Range Status   MRSA by PCR NEGATIVE NEGATIVE Final    Comment:        The GeneXpert MRSA Assay (FDA approved for NASAL specimens only), is one component of a comprehensive MRSA colonization surveillance program. It is not intended to diagnose MRSA infection nor to guide or monitor treatment for MRSA infections.   Urine culture     Status: Abnormal   Collection Time:  02/14/16  1:43 PM  Result Value Ref Range Status   Specimen Description URINE, RANDOM  Final   Special Requests NONE  Final   Culture MULTIPLE SPECIES PRESENT, SUGGEST RECOLLECTION (A)  Final   Report Status 02/15/2016 FINAL  Final     Studies: No results found.  Scheduled Meds: . aspirin EC  81 mg Oral Daily  . doxepin  50 mg Oral QHS  . enoxaparin (LOVENOX) injection  80 mg Subcutaneous Daily  . fluticasone  1 spray Each Nare Daily  . furosemide  40 mg Intravenous Daily  . guaiFENesin  600 mg Oral BID  . Influenza vac split quadrivalent PF  0.5 mL Intramuscular Tomorrow-1000  . insulin aspart  0-15 Units Subcutaneous TID WC  . insulin detemir  10 Units Subcutaneous QHS  . levofloxacin  750 mg Oral Daily  . levothyroxine  150 mcg Oral QAC breakfast  . LORazepam  1 mg Oral Daily   And  . LORazepam  2 mg Oral QHS  . pantoprazole  40 mg Oral Daily  . pneumococcal 23 valent vaccine  0.5 mL Intramuscular Tomorrow-1000  . polyethylene glycol  17 g Oral Daily  . pregabalin  100 mg Oral BID  . senna-docusate  2 tablet Oral BID  . sodium chloride  1,000 mL Intravenous Once   And  . sodium chloride  500 mL Intravenous Once  . tapentadol  100 mg Oral 5 X Daily  . valACYclovir  1,000 mg Oral BID  . venlafaxine XR  150 mg Oral BID WC  . ziprasidone  40 mg Oral QPC supper    Continuous Infusions:   Time spent: 16mns   Abdullah Rizzi MD, PhD  [facility] Pager [phone] If 7PM-7AM, please contact night-coverage at [email or web address], password [email or web address]12/30/2017, 9:10 AM  LOS: 2 days

## 2016-02-17 DIAGNOSIS — D508 Other iron deficiency anemias: Secondary | ICD-10-CM

## 2016-02-17 DIAGNOSIS — Z23 Encounter for immunization: Secondary | ICD-10-CM | POA: Diagnosis not present

## 2016-02-17 DIAGNOSIS — E038 Other specified hypothyroidism: Secondary | ICD-10-CM

## 2016-02-17 LAB — CBC WITH DIFFERENTIAL/PLATELET
BASOS ABS: 0 10*3/uL (ref 0.0–0.1)
BASOS PCT: 1 %
EOS PCT: 5 %
Eosinophils Absolute: 0.4 10*3/uL (ref 0.0–0.7)
HEMATOCRIT: 23.3 % — AB (ref 36.0–46.0)
Hemoglobin: 7.6 g/dL — ABNORMAL LOW (ref 12.0–15.0)
Lymphocytes Relative: 20 %
Lymphs Abs: 1.7 10*3/uL (ref 0.7–4.0)
MCH: 27 pg (ref 26.0–34.0)
MCHC: 32.6 g/dL (ref 30.0–36.0)
MCV: 82.9 fL (ref 78.0–100.0)
MONO ABS: 0.7 10*3/uL (ref 0.1–1.0)
MONOS PCT: 8 %
Neutro Abs: 5.8 10*3/uL (ref 1.7–7.7)
Neutrophils Relative %: 66 %
PLATELETS: 230 10*3/uL (ref 150–400)
RBC: 2.81 MIL/uL — ABNORMAL LOW (ref 3.87–5.11)
RDW: 17 % — AB (ref 11.5–15.5)
WBC: 8.6 10*3/uL (ref 4.0–10.5)

## 2016-02-17 LAB — BASIC METABOLIC PANEL
ANION GAP: 10 (ref 5–15)
BUN: 15 mg/dL (ref 6–20)
CALCIUM: 9.3 mg/dL (ref 8.9–10.3)
CO2: 30 mmol/L (ref 22–32)
CREATININE: 1.29 mg/dL — AB (ref 0.44–1.00)
Chloride: 103 mmol/L (ref 101–111)
GFR calc non Af Amer: 44 mL/min — ABNORMAL LOW (ref >60)
GFR, EST AFRICAN AMERICAN: 51 mL/min — AB (ref >60)
GLUCOSE: 96 mg/dL (ref 65–99)
Potassium: 3.9 mmol/L (ref 3.5–5.1)
Sodium: 143 mmol/L (ref 135–145)

## 2016-02-17 LAB — HEMOGLOBIN A1C
HEMOGLOBIN A1C: 6.1 % — AB (ref 4.8–5.6)
Mean Plasma Glucose: 128 mg/dL

## 2016-02-17 LAB — GLUCOSE, CAPILLARY
GLUCOSE-CAPILLARY: 105 mg/dL — AB (ref 65–99)
GLUCOSE-CAPILLARY: 81 mg/dL (ref 65–99)

## 2016-02-17 LAB — MAGNESIUM: Magnesium: 1.8 mg/dL (ref 1.7–2.4)

## 2016-02-17 MED ORDER — CARVEDILOL 3.125 MG PO TABS: 3.1250 mg | ORAL_TABLET | Freq: Two times a day (BID) | ORAL | 0 refills | Status: DC

## 2016-02-17 MED ORDER — POLYETHYLENE GLYCOL 3350 17 G PO PACK: 17.0000 g | PACK | Freq: Every day | ORAL | 0 refills | Status: DC

## 2016-02-17 MED ORDER — GUAIFENESIN ER 600 MG PO TB12: 600.0000 mg | ORAL_TABLET | Freq: Two times a day (BID) | ORAL | 0 refills | Status: DC

## 2016-02-17 MED ORDER — FUROSEMIDE 40 MG PO TABS: 40.0000 mg | ORAL_TABLET | Freq: Every day | ORAL | 0 refills | Status: DC

## 2016-02-17 MED ORDER — SENNOSIDES-DOCUSATE SODIUM 8.6-50 MG PO TABS: 2.0000 | ORAL_TABLET | Freq: Every day | ORAL | 0 refills | Status: DC

## 2016-02-17 MED ORDER — LEVOTHYROXINE SODIUM 150 MCG PO TABS: 150.0000 ug | ORAL_TABLET | Freq: Every day | ORAL | 0 refills | Status: DC

## 2016-02-17 MED ORDER — VALSARTAN 40 MG PO TABS: 40.0000 mg | ORAL_TABLET | Freq: Every day | ORAL | 0 refills | Status: DC

## 2016-02-17 MED ORDER — LEVOFLOXACIN 750 MG PO TABS: 750.0000 mg | ORAL_TABLET | Freq: Every day | ORAL | 0 refills | Status: DC

## 2016-02-17 MED ORDER — FERROUS SULFATE 325 (65 FE) MG PO TBEC: 325.0000 mg | DELAYED_RELEASE_TABLET | Freq: Every day | ORAL | 0 refills | Status: DC

## 2016-02-17 NOTE — Discharge Summary (Signed)
Discharge Summary  [name] HEN:[id number] DOB: 1955/11/25  PCP: [name], MD  Admit date: 02/13/2016 Discharge date: 02/17/2016  Time spent: <32mns  Recommendations for Outpatient Follow-up:  1. F/u with PMD within a week  for hospital discharge follow up, repeat cbc/bmp at follow up. pmd to repeat tsh in 4-6 weeks 2. F/u with cardiology for diastolic chf, patient is started on lasix from this hospitalization 3. F/u with neurology for parkinson's with frequent falls   Discharge Diagnoses:  Active Hospital Problems   Diagnosis Date Noted  . CAP (community acquired pneumonia) 02/13/2016  . Pneumonia 02/14/2016  . Edema 02/13/2016  . AKI (acute kidney injury) ([facility] 02/13/2016  . Type II diabetes mellitus ([facility] 10/28/2014  . OSA on CPAP 10/28/2014  . Recurrent falls 08/14/2014  . Hypothyroidism 02/27/2006  . Obesity 02/27/2006  . Essential hypertension 02/27/2006    Resolved Hospital Problems   Diagnosis Date Noted Date Resolved  No resolved problems to display.    Discharge Condition: stable  Diet recommendation: heart healthy/carb modified  Filed Weights   02/15/16 2052 02/15/16 2123 02/16/16 2053  Weight: (!) 160.1 kg (353 lb) (!) 160.6 kg (354 lb) (!) 155.9 kg (343 lb 9.6 oz)    History of present illness:  PCP: RMaximino Greenland MD   Patient coming from: Home  Chief Complaint: Generalized weakness, recurrent falls, cough, subjective fever  HPI: Tammie WILDSis a 60y.o. woman with multiple comorbidities including DM, hypothyroidism, OSA on CPAP, morbid obesity, HTN, HLD, CKD 3, and breast cancer who presents to the ED for evaluation of  4-5 days of generalized weakness, subjective fevers, cough productive of yellow sputum, and pleuritic chest pain.  No chills or sweats.  She vomited one time last night.  No significant shortness of breath.  No known sick contacts.  She has had multiple falls, but she also admits that she had recurrent falls  prior to developing her respiratory symptoms this week.  ED Course: Code Sepsis called.  Initial lactic acid level 2.71.  WBC 12.4 (but she appears to have chronic elevation).  Creatinine up to 1.45 from 1.  Hgb 9.  Chest xray concerning for bibasilar infiltrates (atelectasis vs infiltrate).  EKG shows sinus tachycardia.  4.5L of NS ordered in the ED.  IV Rocephin and azithromycin ordered for CAP.  Respiratory viral panel pending.  Hospital Course:  Principal Problem:   CAP (community acquired pneumonia) Active Problems:   Hypothyroidism   Obesity   Essential hypertension   Recurrent falls   Type II diabetes mellitus (HCC)   OSA on CPAP   Edema   AK[place] (acute kidney injury) ([facility]   Pneumonia   Sepsis secondary to multifocal pneumonia, with mild AKI. With metabolic encephalopathy,with sinus tachycardia, borderline blood pressure on presentation.  ---Blood no growth, sputum not collected, respiratory viral panel negative, Urine legionella negative,  streptococcal antigens negative --she was treated with Rocephin and azithromycin since admission with clinical improvement, changed to oral levaquin to minimize volume. -fever resolved, lung clear, she is discharged home with levaquin to finish abx treatment course.   Edema/acute on chronic diastolic chf exacerbation --BNP not elevated, though bnp not reliable in obese patient,  -echo with adequate lvef, venous doppler no DVT. ---strict intake and output, Daily weights -started iv lasix after blood pressure has improved -edema has improved, she is discharged on lasix, cardiology follow up   HTN,  now relative hypotension on admission,  home bp meds held while she  is in the hospital She is discharged on reduced dose of valsartan, newly started him on lasix and low dose coreg, Patient is to follow up with pmd to repeat bmp , pmd to continue adjust blood pressure meds as needed.  Insulin dependent DMs --oral meds held in the  hospital, restarted at discharge --Continue levemir, SSI   Hypothyroid --reduce Levothyroxine (tsh 0.2) Repeat tsh in 4-6 weeks   Anemia:  Likely combination of iron deficiency and anemia of chronic disease seems chronic, hgb close to baseline b12/folate wnl retic inappropriately low Low iron, oral iron supplement with stool softener  Chronic pain --continue home meds Nucynta, oxycodone for breakthrough No new prescription provided at discharge  Recent falls -- PT eval and treat She refused alf or SNF, maximize home health,  She has h/o parkinson's, she is to follow up with neurology  OSA --CPAP  Morbid obesity Body mass index is 58.91 kg/m.   Constipation: stool  softener   DVT prophylaxis:Lovenox Code Status:FULL Family Communication:Patient  Disposition Plan:home health, patient refused snf Consults called:NONE   Procedures:  none  Antibiotics:  Rocephin/zithro from admission to 12/29  levaquin from 12/29    Discharge Exam: BP (!) 151/83 (BP Location: Right Wrist)   Pulse 99   Temp 98.6 F (37 C) (Oral)   Resp 16   Ht _0  (1.651 m)   Wt (!) 155.9 kg (343 lb 9.6 oz)   SpO2 92%   BMI 57.18 kg/m     General:  Obese, fully alert today  Cardiovascular: RRR  Respiratory: improved aeration, no wheezing, no rales, no rhonchi   Abdomen: Soft/ND/NT, positive BS  Musculoskeletal: bilateral lower extremity pitting Edema has much improved  Neuro: aaox3   Discharge Instructions You were cared for by a hospitalist during your hospital stay. If you have any questions about your discharge medications or the care you received while you were in the hospital after you are discharged, you can call the unit and asked to speak with the hospitalist on call if the hospitalist that took care of you is not available. Once you are discharged, your primary care physician will handle any further medical issues. Please note that NO REFILLS  for any discharge medications will be authorized once you are discharged, as it is imperative that you return to your primary care physician (or establish a relationship with a primary care physician if you do not have one) for your aftercare needs so that they can reassess your need for medications and monitor your lab values.  Discharge Instructions    Diet - low sodium heart healthy    Complete by:  As directed    Carb modified diet   Face-to-face encounter (required for Medicare/Medicaid patients)    Complete by:  As directed    I Kyan Giannone certify that this patient is under my care and that I, or a nurse practitioner or physician's assistant working with me, had a face-to-face encounter that meets the physician face-to-face encounter requirements with this patient on 02/17/2016. The encounter with the patient was in whole, or in part for the following medical condition(s) which is the primary reason for home health care (List medical condition): FTT   The encounter with the patient was in whole, or in part, for the following medical condition, which is the primary reason for home health care:  FTT   I certify that, based on my findings, the following services are medically necessary home health services:   Nursing Physical therapy  Reason for Medically Necessary Home Health Services:  Skilled Nursing- Change/Decline in Patient Status   My clinical findings support the need for the above services:  Shortness of breath with activity   Further, I certify that my clinical findings support that this patient is homebound due to:  Pain interferes with ambulation/mobility   Home Health    Complete by:  As directed    To provide the following care/treatments:   PT OT [place] work     Increase activity slowly    Complete by:  As directed      Allergies as of 02/17/2016      Reactions   Bee Venom Anaphylaxis   Shellfish Allergy Anaphylaxis   Buprenorphine Hcl Other (See  Comments)   Overly sedated   Morphine And Related Other (See Comments)   Overly sedated      Medication List    STOP taking these medications   meloxicam 15 MG tablet Commonly known as:  MOBIC   valsartan-hydrochlorothiazide 160-12.5 MG tablet Commonly known as:  DIOVAN-HCT     TAKE these medications   albuterol (2.5 MG/3ML) 0.083% nebulizer solution Commonly known as:  PROVENTIL Inhale 3 mLs into the lungs every 6 (six) hours as needed. For shortness of breath   aspirin EC 81 MG tablet Take 1 tablet (81 mg total) by mouth daily.   B-complex with vitamin C tablet Take 1 tablet by mouth daily.   carvedilol 3.125 MG tablet Commonly known as:  COREG Take 1 tablet (3.125 mg total) by mouth 2 (two) times daily with a meal.   doxepin 25 MG capsule Commonly known as:  SINEQUAN Take 50 mg by mouth at bedtime.   esomeprazole 40 MG capsule Commonly known as:  NEXIUM Take 1 capsule (40 mg total) by mouth daily before breakfast. What changed:  when to take this   ferrous sulfate 325 (65 FE) MG EC tablet Take 1 tablet (325 mg total) by mouth daily with breakfast.   furosemide 40 MG tablet Commonly known as:  LASIX Take 1 tablet (40 mg total) by mouth daily.   guaiFENesin 600 MG 12 hr tablet Commonly known as:  MUCINEX Take 1 tablet (600 mg total) by mouth 2 (two) times daily.   HUMALOG KWIKPEN 100 UNIT/ML KiwkPen Generic drug:  insulin lispro Inject 10 Units into the skin 3 (three) times daily.   LEVEMIR FLEXTOUCH 100 UNIT/ML Pen Generic drug:  Insulin Detemir Inject 10 Units into the skin at bedtime.   levofloxacin 750 MG tablet Commonly known as:  LEVAQUIN Take 1 tablet (750 mg total) by mouth daily. Start taking on:  02/18/2016   levothyroxine 150 MCG tablet Commonly known as:  SYNTHROID, LEVOTHROID Take 1 tablet (150 mcg total) by mouth daily before breakfast. Start taking on:  02/18/2016 What changed:  medication strength  how much to take  when to take  this   LORazepam 2 MG tablet Commonly known as:  ATIVAN Take 1 tablet (2 mg total) by mouth every 12 (twelve) hours as needed for anxiety. What changed:  how much to take  when to take this  additional instructions   LYRICA 100 MG capsule Generic drug:  pregabalin Take 100 mg by mouth 2 (two) times daily.   NUCYNTA ER 250 MG Tb12 Generic drug:  Tapentadol HCl Take 250 mg by mouth every 12 (twelve) hours.   oxyCODONE 15 MG immediate release tablet Commonly known as:  ROXICODONE Take 1 tablet (15 mg total)  by mouth every 6 (six) hours as needed for pain. What changed:  when to take this   polyethylene glycol packet Commonly known as:  MIRALAX / GLYCOLAX Take 17 g by mouth daily. Start taking on:  02/18/2016   senna-docusate 8.6-50 MG tablet Commonly known as:  Senokot-S Take 2 tablets by mouth at bedtime.   sitaGLIPtin-metformin 50-500 MG tablet Commonly known as:  JANUMET Take 1 tablet by mouth 2 (two) times daily with a meal.   tiZANidine 2 MG tablet Commonly known as:  ZANAFLEX Take 2 mg by mouth 2 (two) times daily.   valACYclovir 1000 MG tablet Commonly known as:  VALTREX Take 1,000 mg by mouth 2 (two) times daily.   valsartan 40 MG tablet Commonly known as:  DIOVAN Take 1 tablet (40 mg total) by mouth daily.   venlafaxine XR 150 MG 24 hr capsule Commonly known as:  EFFEXOR-XR Take 150 mg by mouth 2 (two) times daily.   Vitamin D-3 1000 units Caps Take 1,000 Units by mouth daily.   ziprasidone 40 MG capsule Commonly known as:  GEODON Take 40 mg by mouth daily after supper.      Allergies  Allergen Reactions  . Bee Venom Anaphylaxis  . Shellfish Allergy Anaphylaxis  . Buprenorphine Hcl Other (See Comments)    Overly sedated  . Morphine And Related Other (See Comments)    Overly sedated   Follow-up Information    Lenor Coffin, MD Follow up in 2 week(s).   Specialty:  Neurology Why:  for parkinson's Contact information: [address] [place] [address] [phone]        [name], MD Follow up in 1 week(s).   Specialty:  Internal Medicine Why:  hospital discharge follow up, repeat cbc/bmp at follow up. work with your primary care doctor to adjust lasix dose if needed pmd to repeat thyroid function test in 4-6 weeks. Contact information: [address] [place] [address] [phone]        Mertie Moores, MD Follow up in 3 week(s).   Specialty:  Cardiology Why:  for fluids retention/edema Contact information: [place] [address] [phone]            The results of significant diagnostics from this hospitalization (including imaging, microbiology, ancillary and laboratory) are listed below for reference.    Significant Diagnostic Studies: Dg Chest 2 View  Result Date: 02/13/2016 CLINICAL DATA:  Cough and congestion. Weakness and body aches for 3 days. EXAM: CHEST  2 VIEW COMPARISON:  06/24/2015 FINDINGS: AP and lateral views of the chest show borderline cardiomegaly. Vascular congestion noted with probable underlying chronic interstitial changes. Patchy airspace disease noted at the lung bases likely atelectatic although pneumonia not excluded. Surgical clips right lower neck suggest hemithyroidectomy. IMPRESSION: Borderline cardiomegaly with bibasilar atelectasis/ infiltrate. Electronically Signed   By: Misty Stanley M.D.   On: 02/13/2016 19:54   Ct Chest Wo Contrast  Result Date: 02/14/2016 CLINICAL DATA:  Short of breath and cough for 2 weeks. No chest pain. EXAM: CT CHEST WITHOUT CONTRAST TECHNIQUE: Multidetector CT imaging of the chest was performed following the standard protocol without IV contrast. COMPARISON:  Chest radiograph, 02/13/2016.  Chest CT, 08/08/2013. FINDINGS: Cardiovascular: Heart is mildly enlarged. Mild moderate coronary artery calcifications. Mild enlargement of the main pulmonary artery, which measures  3.9 cm. Aorta is normal in caliber. Mediastinum/Nodes: Changes from right thyroid surgery. No neck base or axillary masses or  adenopathy. No mediastinal or hilar masses. 11 mm right subcarinal lymph node. No other adenopathy. Lungs/Pleura: There are 2 areas of left upper lobe consolidation. Small foci of consolidation noted in the right upper lobe. There is dependent opacity in both lower lobes with milder dependent opacity noted in the right upper lobe. No pleural effusion. No pneumothorax. Upper Abdomen: No acute abnormality. Musculoskeletal: No chest wall mass or suspicious bone lesions identified. IMPRESSION: 1. Multifocal pneumonia. Consolidation is most evident in the left upper lobe. 2. Dependent lung opacity most evident lower lobes is likely combination of infection and atelectasis. Electronically Signed   By: Lajean Manes M.D.   On: 02/14/2016 15:25    Microbiology: Recent Results (from the past 240 hour(s))  Respiratory Panel by PCR     Status: None   Collection Time: 02/13/16  7:03 PM  Result Value Ref Range Status   Adenovirus NOT DETECTED NOT DETECTED Final   Coronavirus 229E NOT DETECTED NOT DETECTED Final   Coronavirus HKU1 NOT DETECTED NOT DETECTED Final   Coronavirus NL63 NOT DETECTED NOT DETECTED Final   Coronavirus OC43 NOT DETECTED NOT DETECTED Final   Metapneumovirus NOT DETECTED NOT DETECTED Final   Rhinovirus / Enterovirus NOT DETECTED NOT DETECTED Final   Influenza A NOT DETECTED NOT DETECTED Final   Influenza B NOT DETECTED NOT DETECTED Final   Parainfluenza Virus 1 NOT DETECTED NOT DETECTED Final   Parainfluenza Virus 2 NOT DETECTED NOT DETECTED Final   Parainfluenza Virus 3 NOT DETECTED NOT DETECTED Final   Parainfluenza Virus 4 NOT DETECTED NOT DETECTED Final   Respiratory Syncytial Virus NOT DETECTED NOT DETECTED Final   Bordetella pertussis NOT DETECTED NOT DETECTED Final   Chlamydophila pneumoniae NOT DETECTED NOT DETECTED Final   Mycoplasma pneumoniae NOT  DETECTED NOT DETECTED Final  Culture, blood (Routine X 2) w Reflex to ID Panel     Status: None (Preliminary result)   Collection Time: 02/13/16  8:09 PM  Result Value Ref Range Status   Specimen Description BLOOD RIGHT ANTECUBITAL  Final   Special Requests BOTTLES DRAWN AEROBIC AND ANAEROBIC 5CC  Final   Culture NO GROWTH 3 DAYS  Final   Report Status PENDING  Incomplete  Culture, blood (Routine X 2) w Reflex to ID Panel     Status: None (Preliminary result)   Collection Time: 02/13/16 10:25 PM  Result Value Ref Range Status   Specimen Description BLOOD RIGHT ARM  Final   Special Requests BOTTLES DRAWN AEROBIC AND ANAEROBIC 5ML  Final   Culture NO GROWTH 3 DAYS  Final   Report Status PENDING  Incomplete  MRSA PCR Screening     Status: None   Collection Time: 02/14/16 10:26 AM  Result Value Ref Range Status   MRSA by PCR NEGATIVE NEGATIVE Final    Comment:        The GeneXpert MRSA Assay (FDA approved for NASAL specimens only), is one component of a comprehensive MRSA colonization surveillance program. It is not intended to diagnose MRSA infection nor to guide or monitor treatment for MRSA infections.   Urine culture     Status: Abnormal   Collection Time: 02/14/16  1:43 PM  Result Value Ref Range Status   Specimen Description URINE, RANDOM  Final   Special Requests NONE  Final   Culture MULTIPLE SPECIES PRESENT, SUGGEST RECOLLECTION (A)  Final   Report Status 02/15/2016 FINAL  Final     Labs: Basic Metabolic Panel:  Recent Labs Lab 02/13/16 1905 02/14/16  5945 02/15/16 0754 02/16/16 0600 02/17/16 0232  NA 137 140 141 140 143  K 3.9 3.8 4.1 3.9 3.9  CL 103 108 108 103 103  CO2 21* _0 GLUCOSE 119* 138* 102* 99 96  BUN _1 CREATININE 1.45* 1.44* 1.11* 1.14* 1.29*  CALCIUM 9.3 8.7* 9.0 9.1 9.3  MG  --   --   --  1.8 1.8   Liver Function Tests:  Recent Labs Lab 02/13/16 1905  AST 27  ALT 23  ALKPHOS 74  BILITOT 0.1*  PROT 6.4*    ALBUMIN 3.2*    Recent Labs Lab 02/13/16 1905  LIPASE 13   No results for input(s): AMMONIA in the last 168 hours. CBC:  Recent Labs Lab 02/13/16 1905 02/14/16 0531 02/15/16 0754 02/16/16 0600 02/17/16 0232  WBC 12.4* 9.0 8.7 9.6 8.6  NEUTROABS 9.3*  --   --   --  5.8  HGB 8.9* 7.4* 7.2* 7.2* 7.6*  HCT 27.9* 23.4* 22.9* 22.3* 23.3*  MCV 84.5 85.4 84.8 83.5 82.9  PLT 251 206 213 219 230   Cardiac Enzymes: No results for input(s): CKTOTAL, CKMB, CKMBINDEX, TROPONINI in the last 168 hours. BNP: BNP (last 3 results)  Recent Labs  06/23/15 1333 02/14/16 0531  BNP 162.2* 36.1    ProBNP (last 3 results) No results for input(s): PROBNP in the last 8760 hours.  CBG:  Recent Labs Lab 02/16/16 0830 02/16/16 1153 02/16/16 1658 02/16/16 2056 02/17/16 0800  GLUCAP 99 128* 113* 104* 105*       Signed:  Vincient Vanaman MD, PhD  [facility] 02/17/2016, 11:55 AM

## 2016-02-17 NOTE — Progress Notes (Signed)
Reviewed discharge instructions and medications with patient; all questions answered.  MD notified of patient's pharmacy change. Prescriptions have been sent to [facility] per patient request. Assessment is as charted. Stable; denies complaints. Leaving unit via wheelchair.

## 2016-02-17 NOTE — Care Management Note (Signed)
Case Management Note  Patient Details  Name: [name] MRN: [id number] Date of Birth: 30-Apr-1955  Subjective/Objective:  CAP,CHF                 Action/Plan: Discharge Planning: AVS reviewed: NCM spoke to pt and offered choice for [facility]. Pt states she had [facility] in the past. Contacted [facility] with new referral.   PCP [name] MD  Expected Discharge Date:  02/17/2016              Expected Discharge Plan:  [place]  In-House Referral:  NA  Discharge planning Services  CM Consult  Post Acute Care Choice:  Home Health Choice offered to:  Patient  DME Arranged:  N/A DME Agency:  NA  HH Arranged:  RN, PT, OT, Social Work, Nurse's Aide [place] Agency:  [place]  Status of Service:  Completed, signed off  If discussed at [employer], dates discussed:    Additional Comments:  Erenest Rasher, RN 02/17/2016, 3:34 PM

## 2016-02-18 LAB — CULTURE, BLOOD (ROUTINE X 2)
CULTURE: NO GROWTH
Culture: NO GROWTH

## 2016-02-22 DIAGNOSIS — J189 Pneumonia, unspecified organism: Secondary | ICD-10-CM | POA: Diagnosis not present

## 2016-02-22 DIAGNOSIS — D631 Anemia in chronic kidney disease: Secondary | ICD-10-CM | POA: Diagnosis not present

## 2016-02-22 DIAGNOSIS — Z79891 Long term (current) use of opiate analgesic: Secondary | ICD-10-CM | POA: Diagnosis not present

## 2016-02-22 DIAGNOSIS — R296 Repeated falls: Secondary | ICD-10-CM | POA: Diagnosis not present

## 2016-02-22 DIAGNOSIS — K219 Gastro-esophageal reflux disease without esophagitis: Secondary | ICD-10-CM | POA: Diagnosis not present

## 2016-02-22 DIAGNOSIS — D509 Iron deficiency anemia, unspecified: Secondary | ICD-10-CM | POA: Diagnosis not present

## 2016-02-22 DIAGNOSIS — E1122 Type 2 diabetes mellitus with diabetic chronic kidney disease: Secondary | ICD-10-CM | POA: Diagnosis not present

## 2016-02-22 DIAGNOSIS — Z794 Long term (current) use of insulin: Secondary | ICD-10-CM | POA: Diagnosis not present

## 2016-02-22 DIAGNOSIS — E039 Hypothyroidism, unspecified: Secondary | ICD-10-CM | POA: Diagnosis not present

## 2016-02-22 DIAGNOSIS — N183 Chronic kidney disease, stage 3 (moderate): Secondary | ICD-10-CM | POA: Diagnosis not present

## 2016-02-22 DIAGNOSIS — I129 Hypertensive chronic kidney disease with stage 1 through stage 4 chronic kidney disease, or unspecified chronic kidney disease: Secondary | ICD-10-CM | POA: Diagnosis not present

## 2016-02-22 DIAGNOSIS — G4733 Obstructive sleep apnea (adult) (pediatric): Secondary | ICD-10-CM | POA: Diagnosis not present

## 2016-02-22 DIAGNOSIS — Z9181 History of falling: Secondary | ICD-10-CM | POA: Diagnosis not present

## 2016-02-22 DIAGNOSIS — G20 Parkinson's disease: Secondary | ICD-10-CM | POA: Diagnosis not present

## 2016-02-22 DIAGNOSIS — G8929 Other chronic pain: Secondary | ICD-10-CM | POA: Diagnosis not present

## 2016-02-22 DIAGNOSIS — E785 Hyperlipidemia, unspecified: Secondary | ICD-10-CM | POA: Diagnosis not present

## 2016-02-24 DIAGNOSIS — N183 Chronic kidney disease, stage 3 (moderate): Secondary | ICD-10-CM | POA: Diagnosis not present

## 2016-02-24 DIAGNOSIS — E039 Hypothyroidism, unspecified: Secondary | ICD-10-CM | POA: Diagnosis not present

## 2016-02-24 DIAGNOSIS — G20 Parkinson's disease: Secondary | ICD-10-CM | POA: Diagnosis not present

## 2016-02-24 DIAGNOSIS — D631 Anemia in chronic kidney disease: Secondary | ICD-10-CM | POA: Diagnosis not present

## 2016-02-24 DIAGNOSIS — Z794 Long term (current) use of insulin: Secondary | ICD-10-CM | POA: Diagnosis not present

## 2016-02-24 DIAGNOSIS — G4733 Obstructive sleep apnea (adult) (pediatric): Secondary | ICD-10-CM | POA: Diagnosis not present

## 2016-02-24 DIAGNOSIS — I129 Hypertensive chronic kidney disease with stage 1 through stage 4 chronic kidney disease, or unspecified chronic kidney disease: Secondary | ICD-10-CM | POA: Diagnosis not present

## 2016-02-24 DIAGNOSIS — K219 Gastro-esophageal reflux disease without esophagitis: Secondary | ICD-10-CM | POA: Diagnosis not present

## 2016-02-24 DIAGNOSIS — E1122 Type 2 diabetes mellitus with diabetic chronic kidney disease: Secondary | ICD-10-CM | POA: Diagnosis not present

## 2016-02-24 DIAGNOSIS — J189 Pneumonia, unspecified organism: Secondary | ICD-10-CM | POA: Diagnosis not present

## 2016-02-24 DIAGNOSIS — G8929 Other chronic pain: Secondary | ICD-10-CM | POA: Diagnosis not present

## 2016-02-24 DIAGNOSIS — E785 Hyperlipidemia, unspecified: Secondary | ICD-10-CM | POA: Diagnosis not present

## 2016-02-24 DIAGNOSIS — D509 Iron deficiency anemia, unspecified: Secondary | ICD-10-CM | POA: Diagnosis not present

## 2016-02-24 DIAGNOSIS — R296 Repeated falls: Secondary | ICD-10-CM | POA: Diagnosis not present

## 2016-02-24 DIAGNOSIS — Z79891 Long term (current) use of opiate analgesic: Secondary | ICD-10-CM | POA: Diagnosis not present

## 2016-02-24 DIAGNOSIS — Z9181 History of falling: Secondary | ICD-10-CM | POA: Diagnosis not present

## 2016-02-25 DIAGNOSIS — D509 Iron deficiency anemia, unspecified: Secondary | ICD-10-CM | POA: Diagnosis not present

## 2016-02-25 DIAGNOSIS — Z9181 History of falling: Secondary | ICD-10-CM | POA: Diagnosis not present

## 2016-02-25 DIAGNOSIS — G20 Parkinson's disease: Secondary | ICD-10-CM | POA: Diagnosis not present

## 2016-02-25 DIAGNOSIS — I129 Hypertensive chronic kidney disease with stage 1 through stage 4 chronic kidney disease, or unspecified chronic kidney disease: Secondary | ICD-10-CM | POA: Diagnosis not present

## 2016-02-25 DIAGNOSIS — E785 Hyperlipidemia, unspecified: Secondary | ICD-10-CM | POA: Diagnosis not present

## 2016-02-25 DIAGNOSIS — G4733 Obstructive sleep apnea (adult) (pediatric): Secondary | ICD-10-CM | POA: Diagnosis not present

## 2016-02-25 DIAGNOSIS — J189 Pneumonia, unspecified organism: Secondary | ICD-10-CM | POA: Diagnosis not present

## 2016-02-25 DIAGNOSIS — R296 Repeated falls: Secondary | ICD-10-CM | POA: Diagnosis not present

## 2016-02-25 DIAGNOSIS — N183 Chronic kidney disease, stage 3 (moderate): Secondary | ICD-10-CM | POA: Diagnosis not present

## 2016-02-25 DIAGNOSIS — Z79891 Long term (current) use of opiate analgesic: Secondary | ICD-10-CM | POA: Diagnosis not present

## 2016-02-25 DIAGNOSIS — K219 Gastro-esophageal reflux disease without esophagitis: Secondary | ICD-10-CM | POA: Diagnosis not present

## 2016-02-25 DIAGNOSIS — E039 Hypothyroidism, unspecified: Secondary | ICD-10-CM | POA: Diagnosis not present

## 2016-02-25 DIAGNOSIS — Z794 Long term (current) use of insulin: Secondary | ICD-10-CM | POA: Diagnosis not present

## 2016-02-25 DIAGNOSIS — G8929 Other chronic pain: Secondary | ICD-10-CM | POA: Diagnosis not present

## 2016-02-25 DIAGNOSIS — E1122 Type 2 diabetes mellitus with diabetic chronic kidney disease: Secondary | ICD-10-CM | POA: Diagnosis not present

## 2016-02-25 DIAGNOSIS — D631 Anemia in chronic kidney disease: Secondary | ICD-10-CM | POA: Diagnosis not present

## 2016-02-26 DIAGNOSIS — J189 Pneumonia, unspecified organism: Secondary | ICD-10-CM | POA: Diagnosis not present

## 2016-02-26 DIAGNOSIS — N183 Chronic kidney disease, stage 3 (moderate): Secondary | ICD-10-CM | POA: Diagnosis not present

## 2016-02-26 DIAGNOSIS — R296 Repeated falls: Secondary | ICD-10-CM | POA: Diagnosis not present

## 2016-02-26 DIAGNOSIS — I129 Hypertensive chronic kidney disease with stage 1 through stage 4 chronic kidney disease, or unspecified chronic kidney disease: Secondary | ICD-10-CM | POA: Diagnosis not present

## 2016-02-26 DIAGNOSIS — E785 Hyperlipidemia, unspecified: Secondary | ICD-10-CM | POA: Diagnosis not present

## 2016-02-26 DIAGNOSIS — D631 Anemia in chronic kidney disease: Secondary | ICD-10-CM | POA: Diagnosis not present

## 2016-02-26 DIAGNOSIS — G20 Parkinson's disease: Secondary | ICD-10-CM | POA: Diagnosis not present

## 2016-02-26 DIAGNOSIS — E039 Hypothyroidism, unspecified: Secondary | ICD-10-CM | POA: Diagnosis not present

## 2016-02-26 DIAGNOSIS — Z794 Long term (current) use of insulin: Secondary | ICD-10-CM | POA: Diagnosis not present

## 2016-02-26 DIAGNOSIS — K219 Gastro-esophageal reflux disease without esophagitis: Secondary | ICD-10-CM | POA: Diagnosis not present

## 2016-02-26 DIAGNOSIS — G8929 Other chronic pain: Secondary | ICD-10-CM | POA: Diagnosis not present

## 2016-02-26 DIAGNOSIS — E1122 Type 2 diabetes mellitus with diabetic chronic kidney disease: Secondary | ICD-10-CM | POA: Diagnosis not present

## 2016-02-26 DIAGNOSIS — Z9181 History of falling: Secondary | ICD-10-CM | POA: Diagnosis not present

## 2016-02-26 DIAGNOSIS — D509 Iron deficiency anemia, unspecified: Secondary | ICD-10-CM | POA: Diagnosis not present

## 2016-02-26 DIAGNOSIS — Z79891 Long term (current) use of opiate analgesic: Secondary | ICD-10-CM | POA: Diagnosis not present

## 2016-02-26 DIAGNOSIS — G4733 Obstructive sleep apnea (adult) (pediatric): Secondary | ICD-10-CM | POA: Diagnosis not present

## 2016-02-28 DIAGNOSIS — Z9181 History of falling: Secondary | ICD-10-CM | POA: Diagnosis not present

## 2016-02-28 DIAGNOSIS — K219 Gastro-esophageal reflux disease without esophagitis: Secondary | ICD-10-CM | POA: Diagnosis not present

## 2016-02-28 DIAGNOSIS — G4733 Obstructive sleep apnea (adult) (pediatric): Secondary | ICD-10-CM | POA: Diagnosis not present

## 2016-02-28 DIAGNOSIS — D631 Anemia in chronic kidney disease: Secondary | ICD-10-CM | POA: Diagnosis not present

## 2016-02-28 DIAGNOSIS — G8929 Other chronic pain: Secondary | ICD-10-CM | POA: Diagnosis not present

## 2016-02-28 DIAGNOSIS — E1122 Type 2 diabetes mellitus with diabetic chronic kidney disease: Secondary | ICD-10-CM | POA: Diagnosis not present

## 2016-02-28 DIAGNOSIS — R296 Repeated falls: Secondary | ICD-10-CM | POA: Diagnosis not present

## 2016-02-28 DIAGNOSIS — N183 Chronic kidney disease, stage 3 (moderate): Secondary | ICD-10-CM | POA: Diagnosis not present

## 2016-02-28 DIAGNOSIS — G20 Parkinson's disease: Secondary | ICD-10-CM | POA: Diagnosis not present

## 2016-02-28 DIAGNOSIS — D509 Iron deficiency anemia, unspecified: Secondary | ICD-10-CM | POA: Diagnosis not present

## 2016-02-28 DIAGNOSIS — J189 Pneumonia, unspecified organism: Secondary | ICD-10-CM | POA: Diagnosis not present

## 2016-02-28 DIAGNOSIS — E785 Hyperlipidemia, unspecified: Secondary | ICD-10-CM | POA: Diagnosis not present

## 2016-02-28 DIAGNOSIS — Z794 Long term (current) use of insulin: Secondary | ICD-10-CM | POA: Diagnosis not present

## 2016-02-28 DIAGNOSIS — Z79891 Long term (current) use of opiate analgesic: Secondary | ICD-10-CM | POA: Diagnosis not present

## 2016-02-28 DIAGNOSIS — E039 Hypothyroidism, unspecified: Secondary | ICD-10-CM | POA: Diagnosis not present

## 2016-02-28 DIAGNOSIS — I129 Hypertensive chronic kidney disease with stage 1 through stage 4 chronic kidney disease, or unspecified chronic kidney disease: Secondary | ICD-10-CM | POA: Diagnosis not present

## 2016-02-29 ENCOUNTER — Emergency Department (HOSPITAL_COMMUNITY): Payer: Medicare Other

## 2016-02-29 ENCOUNTER — Inpatient Hospital Stay (HOSPITAL_COMMUNITY): Payer: Medicare Other

## 2016-02-29 ENCOUNTER — Ambulatory Visit (INDEPENDENT_AMBULATORY_CARE_PROVIDER_SITE_OTHER): Payer: Medicare Other | Admitting: Orthopedic Surgery

## 2016-02-29 ENCOUNTER — Inpatient Hospital Stay (HOSPITAL_COMMUNITY)
Admission: EM | Admit: 2016-02-29 | Discharge: 2016-03-03 | DRG: 871 | Disposition: A | Discharge status: Home Health | Payer: Medicare Other | Attending: Internal Medicine | Admitting: Internal Medicine

## 2016-02-29 VITALS — Ht 65.0 in | Wt 343.0 lb

## 2016-02-29 DIAGNOSIS — F209 Schizophrenia, unspecified: Secondary | ICD-10-CM | POA: Diagnosis present

## 2016-02-29 DIAGNOSIS — R4781 Slurred speech: Secondary | ICD-10-CM

## 2016-02-29 DIAGNOSIS — M6281 Muscle weakness (generalized): Secondary | ICD-10-CM | POA: Diagnosis not present

## 2016-02-29 DIAGNOSIS — E1122 Type 2 diabetes mellitus with diabetic chronic kidney disease: Secondary | ICD-10-CM | POA: Diagnosis present

## 2016-02-29 DIAGNOSIS — E1142 Type 2 diabetes mellitus with diabetic polyneuropathy: Secondary | ICD-10-CM | POA: Diagnosis not present

## 2016-02-29 DIAGNOSIS — M25531 Pain in right wrist: Secondary | ICD-10-CM | POA: Diagnosis not present

## 2016-02-29 DIAGNOSIS — K219 Gastro-esophageal reflux disease without esophagitis: Secondary | ICD-10-CM | POA: Diagnosis not present

## 2016-02-29 DIAGNOSIS — R4182 Altered mental status, unspecified: Secondary | ICD-10-CM | POA: Diagnosis present

## 2016-02-29 DIAGNOSIS — Z91013 Allergy to seafood: Secondary | ICD-10-CM

## 2016-02-29 DIAGNOSIS — Z885 Allergy status to narcotic agent status: Secondary | ICD-10-CM

## 2016-02-29 DIAGNOSIS — M7989 Other specified soft tissue disorders: Secondary | ICD-10-CM | POA: Diagnosis not present

## 2016-02-29 DIAGNOSIS — S6991XA Unspecified injury of right wrist, hand and finger(s), initial encounter: Secondary | ICD-10-CM | POA: Diagnosis not present

## 2016-02-29 DIAGNOSIS — I129 Hypertensive chronic kidney disease with stage 1 through stage 4 chronic kidney disease, or unspecified chronic kidney disease: Secondary | ICD-10-CM | POA: Diagnosis not present

## 2016-02-29 DIAGNOSIS — Z853 Personal history of malignant neoplasm of breast: Secondary | ICD-10-CM

## 2016-02-29 DIAGNOSIS — G9340 Encephalopathy, unspecified: Secondary | ICD-10-CM | POA: Diagnosis present

## 2016-02-29 DIAGNOSIS — Z923 Personal history of irradiation: Secondary | ICD-10-CM

## 2016-02-29 DIAGNOSIS — Z803 Family history of malignant neoplasm of breast: Secondary | ICD-10-CM

## 2016-02-29 DIAGNOSIS — J189 Pneumonia, unspecified organism: Secondary | ICD-10-CM | POA: Diagnosis present

## 2016-02-29 DIAGNOSIS — Y95 Nosocomial condition: Secondary | ICD-10-CM | POA: Diagnosis present

## 2016-02-29 DIAGNOSIS — F419 Anxiety disorder, unspecified: Secondary | ICD-10-CM | POA: Diagnosis present

## 2016-02-29 DIAGNOSIS — Z8249 Family history of ischemic heart disease and other diseases of the circulatory system: Secondary | ICD-10-CM

## 2016-02-29 DIAGNOSIS — M5126 Other intervertebral disc displacement, lumbar region: Secondary | ICD-10-CM | POA: Diagnosis not present

## 2016-02-29 DIAGNOSIS — N1832 Chronic kidney disease, stage 3b: Secondary | ICD-10-CM | POA: Diagnosis present

## 2016-02-29 DIAGNOSIS — E785 Hyperlipidemia, unspecified: Secondary | ICD-10-CM | POA: Diagnosis not present

## 2016-02-29 DIAGNOSIS — E119 Type 2 diabetes mellitus without complications: Secondary | ICD-10-CM

## 2016-02-29 DIAGNOSIS — E890 Postprocedural hypothyroidism: Secondary | ICD-10-CM | POA: Diagnosis not present

## 2016-02-29 DIAGNOSIS — B009 Herpesviral infection, unspecified: Secondary | ICD-10-CM | POA: Diagnosis present

## 2016-02-29 DIAGNOSIS — R29818 Other symptoms and signs involving the nervous system: Secondary | ICD-10-CM | POA: Diagnosis not present

## 2016-02-29 DIAGNOSIS — Z91030 Bee allergy status: Secondary | ICD-10-CM

## 2016-02-29 DIAGNOSIS — M5127 Other intervertebral disc displacement, lumbosacral region: Secondary | ICD-10-CM | POA: Diagnosis not present

## 2016-02-29 DIAGNOSIS — M47816 Spondylosis without myelopathy or radiculopathy, lumbar region: Secondary | ICD-10-CM | POA: Diagnosis not present

## 2016-02-29 DIAGNOSIS — G4733 Obstructive sleep apnea (adult) (pediatric): Secondary | ICD-10-CM | POA: Diagnosis not present

## 2016-02-29 DIAGNOSIS — G8929 Other chronic pain: Secondary | ICD-10-CM | POA: Diagnosis present

## 2016-02-29 DIAGNOSIS — R471 Dysarthria and anarthria: Secondary | ICD-10-CM | POA: Diagnosis not present

## 2016-02-29 DIAGNOSIS — N183 Chronic kidney disease, stage 3 unspecified: Secondary | ICD-10-CM | POA: Diagnosis present

## 2016-02-29 DIAGNOSIS — A419 Sepsis, unspecified organism: Secondary | ICD-10-CM | POA: Diagnosis not present

## 2016-02-29 DIAGNOSIS — M549 Dorsalgia, unspecified: Secondary | ICD-10-CM

## 2016-02-29 DIAGNOSIS — R627 Adult failure to thrive: Secondary | ICD-10-CM | POA: Diagnosis present

## 2016-02-29 DIAGNOSIS — G219 Secondary parkinsonism, unspecified: Secondary | ICD-10-CM | POA: Diagnosis not present

## 2016-02-29 DIAGNOSIS — R531 Weakness: Secondary | ICD-10-CM | POA: Diagnosis not present

## 2016-02-29 DIAGNOSIS — Z7982 Long term (current) use of aspirin: Secondary | ICD-10-CM

## 2016-02-29 DIAGNOSIS — N179 Acute kidney failure, unspecified: Secondary | ICD-10-CM | POA: Diagnosis not present

## 2016-02-29 DIAGNOSIS — M48061 Spinal stenosis, lumbar region without neurogenic claudication: Secondary | ICD-10-CM | POA: Diagnosis not present

## 2016-02-29 DIAGNOSIS — J9601 Acute respiratory failure with hypoxia: Secondary | ICD-10-CM | POA: Diagnosis present

## 2016-02-29 DIAGNOSIS — I6789 Other cerebrovascular disease: Secondary | ICD-10-CM | POA: Diagnosis not present

## 2016-02-29 DIAGNOSIS — Z96653 Presence of artificial knee joint, bilateral: Secondary | ICD-10-CM | POA: Diagnosis present

## 2016-02-29 DIAGNOSIS — Z8711 Personal history of peptic ulcer disease: Secondary | ICD-10-CM

## 2016-02-29 DIAGNOSIS — R918 Other nonspecific abnormal finding of lung field: Secondary | ICD-10-CM | POA: Diagnosis not present

## 2016-02-29 DIAGNOSIS — M79631 Pain in right forearm: Secondary | ICD-10-CM | POA: Diagnosis not present

## 2016-02-29 DIAGNOSIS — E039 Hypothyroidism, unspecified: Secondary | ICD-10-CM | POA: Diagnosis present

## 2016-02-29 DIAGNOSIS — Z87891 Personal history of nicotine dependence: Secondary | ICD-10-CM

## 2016-02-29 DIAGNOSIS — F329 Major depressive disorder, single episode, unspecified: Secondary | ICD-10-CM | POA: Diagnosis present

## 2016-02-29 DIAGNOSIS — Z794 Long term (current) use of insulin: Secondary | ICD-10-CM

## 2016-02-29 DIAGNOSIS — R29898 Other symptoms and signs involving the musculoskeletal system: Secondary | ICD-10-CM

## 2016-02-29 DIAGNOSIS — S59911A Unspecified injury of right forearm, initial encounter: Secondary | ICD-10-CM | POA: Diagnosis not present

## 2016-02-29 DIAGNOSIS — Z79899 Other long term (current) drug therapy: Secondary | ICD-10-CM

## 2016-02-29 DIAGNOSIS — I959 Hypotension, unspecified: Secondary | ICD-10-CM | POA: Diagnosis present

## 2016-02-29 DIAGNOSIS — Z888 Allergy status to other drugs, medicaments and biological substances status: Secondary | ICD-10-CM

## 2016-02-29 DIAGNOSIS — R4189 Other symptoms and signs involving cognitive functions and awareness: Secondary | ICD-10-CM

## 2016-02-29 DIAGNOSIS — R52 Pain, unspecified: Secondary | ICD-10-CM

## 2016-02-29 LAB — RAPID URINE DRUG SCREEN, HOSP PERFORMED
Amphetamines: NOT DETECTED
BARBITURATES: NOT DETECTED
BENZODIAZEPINES: POSITIVE — AB
COCAINE: NOT DETECTED
OPIATES: POSITIVE — AB
Tetrahydrocannabinol: NOT DETECTED

## 2016-02-29 LAB — COMPREHENSIVE METABOLIC PANEL
ALK PHOS: 67 U/L (ref 38–126)
ALT: 27 U/L (ref 14–54)
AST: 47 U/L — AB (ref 15–41)
Albumin: 3.2 g/dL — ABNORMAL LOW (ref 3.5–5.0)
Anion gap: 13 (ref 5–15)
BUN: 21 mg/dL — AB (ref 6–20)
CALCIUM: 8.9 mg/dL (ref 8.9–10.3)
CHLORIDE: 95 mmol/L — AB (ref 101–111)
CO2: 24 mmol/L (ref 22–32)
CREATININE: 1.82 mg/dL — AB (ref 0.44–1.00)
GFR calc Af Amer: 34 mL/min — ABNORMAL LOW (ref >60)
GFR, EST NON AFRICAN AMERICAN: 29 mL/min — AB (ref >60)
Glucose, Bld: 195 mg/dL — ABNORMAL HIGH (ref 65–99)
Potassium: 4.2 mmol/L (ref 3.5–5.1)
Sodium: 132 mmol/L — ABNORMAL LOW (ref 135–145)
Total Bilirubin: 0.3 mg/dL (ref 0.3–1.2)
Total Protein: 6 g/dL — ABNORMAL LOW (ref 6.5–8.1)

## 2016-02-29 LAB — I-STAT ARTERIAL BLOOD GAS, ED
Acid-base deficit: 2 mmol/L (ref 0.0–2.0)
BICARBONATE: 23.3 mmol/L (ref 20.0–28.0)
O2 Saturation: 90 %
PCO2 ART: 40.6 mmHg (ref 32.0–48.0)
PO2 ART: 60 mmHg — AB (ref 83.0–108.0)
TCO2: 25 mmol/L (ref 0–100)
pH, Arterial: 7.368 (ref 7.350–7.450)

## 2016-02-29 LAB — PROTIME-INR
INR: 1.04
Prothrombin Time: 13.6 seconds (ref 11.4–15.2)

## 2016-02-29 LAB — URINALYSIS, ROUTINE W REFLEX MICROSCOPIC
Glucose, UA: NEGATIVE mg/dL
Hgb urine dipstick: NEGATIVE
KETONES UR: NEGATIVE mg/dL
LEUKOCYTES UA: NEGATIVE
NITRITE: NEGATIVE
PROTEIN: NEGATIVE mg/dL
Specific Gravity, Urine: 1.019 (ref 1.005–1.030)
pH: 5 (ref 5.0–8.0)

## 2016-02-29 LAB — I-STAT TROPONIN, ED: TROPONIN I, POC: 0 ng/mL (ref 0.00–0.08)

## 2016-02-29 LAB — I-STAT CHEM 8, ED
BUN: 23 mg/dL — ABNORMAL HIGH (ref 6–20)
CREATININE: 1.8 mg/dL — AB (ref 0.44–1.00)
Calcium, Ion: 1.09 mmol/L — ABNORMAL LOW (ref 1.15–1.40)
Chloride: 96 mmol/L — ABNORMAL LOW (ref 101–111)
GLUCOSE: 193 mg/dL — AB (ref 65–99)
HCT: 27 % — ABNORMAL LOW (ref 36.0–46.0)
HEMOGLOBIN: 9.2 g/dL — AB (ref 12.0–15.0)
Potassium: 4.1 mmol/L (ref 3.5–5.1)
Sodium: 133 mmol/L — ABNORMAL LOW (ref 135–145)
TCO2: 27 mmol/L (ref 0–100)

## 2016-02-29 LAB — CBC
HEMATOCRIT: 28.1 % — AB (ref 36.0–46.0)
HEMOGLOBIN: 8.9 g/dL — AB (ref 12.0–15.0)
MCH: 26.8 pg (ref 26.0–34.0)
MCHC: 31.7 g/dL (ref 30.0–36.0)
MCV: 84.6 fL (ref 78.0–100.0)
Platelets: 305 10*3/uL (ref 150–400)
RBC: 3.32 MIL/uL — ABNORMAL LOW (ref 3.87–5.11)
RDW: 17.9 % — AB (ref 11.5–15.5)
WBC: 18.4 10*3/uL — ABNORMAL HIGH (ref 4.0–10.5)

## 2016-02-29 LAB — LIPID PANEL
CHOL/HDL RATIO: 2 ratio
Cholesterol: 111 mg/dL (ref 0–200)
HDL: 55 mg/dL (ref >40)
LDL Cholesterol: 35 mg/dL (ref 0–99)
Triglycerides: 104 mg/dL (ref <150)
VLDL: 21 mg/dL (ref 0–40)

## 2016-02-29 LAB — DIFFERENTIAL
BASOS ABS: 0 10*3/uL (ref 0.0–0.1)
BASOS PCT: 0 %
EOS ABS: 0.4 10*3/uL (ref 0.0–0.7)
EOS PCT: 2 %
LYMPHS ABS: 1.6 10*3/uL (ref 0.7–4.0)
Lymphocytes Relative: 9 %
MONO ABS: 0.5 10*3/uL (ref 0.1–1.0)
MONOS PCT: 3 %
Neutro Abs: 15.9 10*3/uL — ABNORMAL HIGH (ref 1.7–7.7)
Neutrophils Relative %: 86 %

## 2016-02-29 LAB — GLUCOSE, CAPILLARY: Glucose-Capillary: 130 mg/dL — ABNORMAL HIGH (ref 65–99)

## 2016-02-29 LAB — CBG MONITORING, ED: GLUCOSE-CAPILLARY: 189 mg/dL — AB (ref 65–99)

## 2016-02-29 LAB — MRSA PCR SCREENING: MRSA by PCR: NEGATIVE

## 2016-02-29 LAB — INFLUENZA PANEL BY PCR (TYPE A & B)
Influenza A By PCR: NEGATIVE
Influenza B By PCR: NEGATIVE

## 2016-02-29 LAB — TROPONIN I: Troponin I: <0.03 ng/mL (ref <0.03)

## 2016-02-29 LAB — I-STAT CG4 LACTIC ACID, ED: Lactic Acid, Venous: 1.8 mmol/L (ref 0.5–1.9)

## 2016-02-29 LAB — APTT: APTT: 30 s (ref 24–36)

## 2016-02-29 MED ORDER — VANCOMYCIN HCL 10 G IV SOLR
2500.0000 mg | Freq: Once | INTRAVENOUS | Status: AC
  Administered 2016-02-29: 2500 mg via INTRAVENOUS
  Filled 2016-02-29: qty 2500

## 2016-02-29 MED ORDER — SODIUM CHLORIDE 0.9 % IV BOLUS (SEPSIS)
1000.0000 mL | Freq: Once | INTRAVENOUS | Status: AC
Start: 2016-02-29 — End: 2016-02-29
  Administered 2016-02-29: 1000 mL via INTRAVENOUS

## 2016-02-29 MED ORDER — INSULIN DETEMIR 100 UNIT/ML ~~LOC~~ SOLN
10.0000 [IU] | Freq: Every day | SUBCUTANEOUS | Status: DC
  Administered 2016-02-29 – 2016-03-01 (×2): 10 [IU] via SUBCUTANEOUS
  Filled 2016-02-29 (×4): qty 0.1

## 2016-02-29 MED ORDER — DEXTROSE 5 % IV SOLN
2.0000 g | Freq: Two times a day (BID) | INTRAVENOUS | Status: DC
  Administered 2016-03-01 – 2016-03-03 (×5): 2 g via INTRAVENOUS
  Filled 2016-02-29 (×7): qty 2

## 2016-02-29 MED ORDER — SODIUM CHLORIDE 0.9 % IV SOLN
INTRAVENOUS | Status: DC
  Administered 2016-03-01: 01:00:00 via INTRAVENOUS

## 2016-02-29 MED ORDER — GUAIFENESIN ER 600 MG PO TB12
600.0000 mg | ORAL_TABLET | Freq: Two times a day (BID) | ORAL | Status: DC
  Administered 2016-02-29 – 2016-03-03 (×6): 600 mg via ORAL
  Filled 2016-02-29 (×6): qty 1

## 2016-02-29 MED ORDER — HEPARIN SODIUM (PORCINE) 5000 UNIT/ML IJ SOLN: 60.0000 [IU]/kg | Freq: Once | INTRAMUSCULAR | Status: DC

## 2016-02-29 MED ORDER — VANCOMYCIN HCL 10 G IV SOLR
1750.0000 mg | INTRAVENOUS | Status: DC
  Administered 2016-03-01: 1750 mg via INTRAVENOUS
  Filled 2016-02-29 (×3): qty 1750

## 2016-02-29 MED ORDER — ASPIRIN 81 MG PO CHEW: 324.0000 mg | CHEWABLE_TABLET | Freq: Once | ORAL | Status: DC

## 2016-02-29 MED ORDER — LORAZEPAM 0.5 MG PO TABS
0.5000 mg | ORAL_TABLET | Freq: Two times a day (BID) | ORAL | Status: DC | PRN
  Administered 2016-03-01 – 2016-03-02 (×2): 1 mg via ORAL
  Filled 2016-02-29 (×2): qty 2

## 2016-02-29 MED ORDER — SODIUM CHLORIDE 0.9 % IV BOLUS (SEPSIS)
1000.0000 mL | Freq: Once | INTRAVENOUS | Status: AC
  Administered 2016-02-29: 1000 mL via INTRAVENOUS

## 2016-02-29 MED ORDER — INSULIN ASPART 100 UNIT/ML ~~LOC~~ SOLN
0.0000 [IU] | Freq: Three times a day (TID) | SUBCUTANEOUS | Status: DC
  Administered 2016-03-01: 1 [IU] via SUBCUTANEOUS
  Administered 2016-03-01: 3 [IU] via SUBCUTANEOUS
  Administered 2016-03-02: 1 [IU] via SUBCUTANEOUS

## 2016-02-29 MED ORDER — OXYCODONE HCL 5 MG PO TABS
15.0000 mg | ORAL_TABLET | Freq: Four times a day (QID) | ORAL | Status: DC | PRN
  Administered 2016-03-01 – 2016-03-03 (×6): 15 mg via ORAL
  Filled 2016-02-29 (×6): qty 3

## 2016-02-29 MED ORDER — LEVOTHYROXINE SODIUM 75 MCG PO TABS
150.0000 ug | ORAL_TABLET | Freq: Every day | ORAL | Status: DC
  Administered 2016-03-01 – 2016-03-03 (×3): 150 ug via ORAL
  Filled 2016-02-29 (×4): qty 2

## 2016-02-29 MED ORDER — ALBUTEROL SULFATE (2.5 MG/3ML) 0.083% IN NEBU: 3.0000 mL | INHALATION_SOLUTION | Freq: Four times a day (QID) | RESPIRATORY_TRACT | Status: DC | PRN

## 2016-02-29 MED ORDER — ASPIRIN EC 81 MG PO TBEC
81.0000 mg | DELAYED_RELEASE_TABLET | Freq: Every day | ORAL | Status: DC
  Administered 2016-03-01 – 2016-03-03 (×3): 81 mg via ORAL
  Filled 2016-02-29 (×3): qty 1

## 2016-02-29 MED ORDER — DEXTROSE 5 % IV SOLN
2.0000 g | Freq: Once | INTRAVENOUS | Status: AC
  Administered 2016-02-29: 2 g via INTRAVENOUS
  Filled 2016-02-29: qty 2

## 2016-02-29 MED ORDER — ORAL CARE MOUTH RINSE
15.0000 mL | Freq: Two times a day (BID) | OROMUCOSAL | Status: DC
  Administered 2016-03-01 (×2): 15 mL via OROMUCOSAL

## 2016-02-29 MED ORDER — ENOXAPARIN SODIUM 40 MG/0.4ML ~~LOC~~ SOLN
40.0000 mg | SUBCUTANEOUS | Status: DC
  Administered 2016-02-29: 40 mg via SUBCUTANEOUS
  Filled 2016-02-29: qty 0.4

## 2016-02-29 MED ORDER — ZIPRASIDONE HCL 20 MG PO CAPS
20.0000 mg | ORAL_CAPSULE | Freq: Every day | ORAL | Status: DC
  Administered 2016-02-29 – 2016-03-02 (×3): 20 mg via ORAL
  Filled 2016-02-29 (×5): qty 1

## 2016-02-29 MED ORDER — VENLAFAXINE HCL ER 150 MG PO CP24
150.0000 mg | ORAL_CAPSULE | Freq: Two times a day (BID) | ORAL | Status: DC
  Administered 2016-02-29 – 2016-03-03 (×6): 150 mg via ORAL
  Filled 2016-02-29 (×2): qty 1
  Filled 2016-02-29: qty 2
  Filled 2016-02-29: qty 1
  Filled 2016-02-29: qty 2
  Filled 2016-02-29 (×2): qty 1

## 2016-02-29 MED ORDER — SODIUM CHLORIDE 0.9% FLUSH
3.0000 mL | Freq: Two times a day (BID) | INTRAVENOUS | Status: DC
  Administered 2016-03-01 – 2016-03-02 (×4): 3 mL via INTRAVENOUS

## 2016-02-29 NOTE — Progress Notes (Signed)
Pharmacy Antibiotic Note  [name] is a 61 y.o. female admitted on 02/29/2016 initially presented as a code stroke, she had lethargy, slurred speech and trouble hearing. Upon neuro eval, they deemed her symptoms not be related to a TIA or stroke. She also complained of a cough and was admitted at the end of December for CAP. Currently, WBC 18.4, afebrile, BP low, lactic acid wnl. To start broad spectrum antibiotics.    Plan: -Vancomycin 2500 mg IV x1 then 1750 mg IV q24h -Cefepime 2 g IV q12h - monitor, borderline for dose adjustment -Monitor renal fx, cultures, obtain vancomycin trough at steady state   Height: _0  (165.1 cm) Weight: (!) 346 lb 9 oz (157.2 kg) IBW/kg (Calculated) : 57  Temp (24hrs), Avg:99 F (37.2 C), Min:98.9 F (37.2 C), Max:99.1 F (37.3 C)   Recent Labs Lab 02/29/16 1127 02/29/16 1133 02/29/16 1418  WBC 18.4*  --   --   CREATININE 1.82* 1.80*  --   LATICACIDVEN  --   --  1.80    Estimated Creatinine Clearance: 51.6 mL/min (by C-G formula based on SCr of 1.8 mg/dL (H)).    Allergies  Allergen Reactions  . Bee Venom Anaphylaxis  . Shellfish Allergy Anaphylaxis  . Buprenorphine Hcl Other (See Comments)    Overly sedated  . Morphine And Related Other (See Comments)    Overly sedated    Antimicrobials this admission: 1/12 vancomycin > 1/12 cefepime >  Dose adjustments this admission: N/A  Microbiology results: 1/12 blood cx:   Thank you for allowing pharmacy to be a part of this patient's care.   Harvel Quale 02/29/2016 4:56 PM

## 2016-02-29 NOTE — Consult Note (Addendum)
Neurology Consultation Reason for Consult: Code Stroke Referring Physician: ED  CC: Dysarthria  History is obtained from: EMS, Patient and Chart Review  HPI: [name] is a 61 y.o. female with previous history of syncope and polypharmacy presented to orthopedics clinic and had sudden onset of slurring of speech noted by staff. She was also noted to be very somnolent and hard of hearing therefore EMS was summoned. In route her blood pressure was found to be 80/54 and blood sugar was 197. Patient was reporting generalized weakness. Patient was evaluated emergently in the emergency room and noted NIH stroke scale to be 2. She had very mild slurring of speech which was secondary to sedation and right lower extremity drift which is chronic. Patient reports that she has been having weakness in her right leg for the past 1 month due to pain in her hip joint. She said at times she has been dragging this leg. 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 Chart was reviewed and patient was seen by neurology multiple times in 2016 due to syncope. She is on multiple medications that have CNS depressant effect.  At baseline patient reports she walks with a walker due to bilateral lower extremity pain and weakness.  LKW: 10:45AM tpa given?: no, patient with no acute symptoms and non focal exam other than RLE drift which is chronic.  ROS: A 14 point ROS was performed and is negative.  Past Medical History:  Diagnosis Date  . Anemia   . Anxiety   . Arthritis    "pretty much all my joints"  . Asthma   . Benign paroxysmal positional vertigo 12/30/2012  . Breast cancer ([place]) 02/13/12   ruq  100'clock bx Ductal Carcinoma in Situ,(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  . Breast mass in female    L breast 2008, US showed likely fat necrosis  . Chronic lower back pain   . CKD (chronic kidney disease), stage III    "lower stage" (01/06/2014)  . Daily  headache   . Depression   . GERD (gastroesophageal reflux disease)   . History of blood transfusion 2011   "after one of my OR's"  . History of hiatal hernia   . History of stomach ulcers   . HSV (herpes simplex virus) infection   . Hx of radiation therapy 05/05/12- 07/15/12   right breast, 45 gray x 25 fx, lumpectomy cavity boosted to 16.2 gray  . Hyperlipemia   . Hypertension    sees Dr. Criss Rosales , [facility]  . Hypothyroidism   . Kidney stones   . Knee pain, bilateral   . Migraines    "~ 3 times/month" (01/06/2014)  . Obesity   . OSA on CPAP    pt does not know settings  . Pancreatitis    hx of  . Pneumonia 1950's  . Polyneuropathy in diabetes(357.2)   . Schizophrenia ([facility])   . 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 Tachycardia    with sx in 2008  . Thyroid disease   . Type II diabetes mellitus ([facility])      Family History  Problem Relation Age of Onset  . Heart disease Brother     Multiple MIs, starting in his 23s  . Diabetes Brother   . Hypertension Mother   . Diabetes Mother   . Breast cancer Mother 57  . Bone cancer Mother   . Hypertension Sister   . Diabetes Sister   .  Breast cancer Sister 40  . Breast cancer Maternal Grandmother   . Heart disease Maternal Grandmother   . Uterine cancer Other 19  . Breast cancer Paternal Aunt 58  . Breast cancer Paternal Grandmother     dx in her 62s  . Prostate cancer Paternal Grandfather     Social History:  reports that she quit smoking about 23 years ago. Her smoking use included Cigarettes. She has a 7.00 pack-year smoking history. She has never used smokeless tobacco. She reports that she does not drink alcohol or use drugs.  Exam: Current vital signs: BP (!) 90/53 (BP Location: Right Arm)   Pulse 108   Temp 99.1 F (37.3 C) (Oral)   Resp 18   Ht _0  (1.651 m)   Wt (!) 157.2 kg (346 lb 9 oz)   SpO2 (!) 87%   BMI 57.67 kg/m  Vital signs in last 24 hours: Temp:  [99.1 F (37.3 C)] 99.1 F (37.3 C)  (01/12 1147) Pulse Rate:  [108] 108 (01/12 1147) Resp:  [18] 18 (01/12 1147) BP: (90)/(53) 90/53 (01/12 1147) SpO2:  [87 %-98 %] 87 % (01/12 1147) Weight:  [155.6 kg (343 lb)-157.2 kg (346 lb 9 oz)] 157.2 kg (346 lb 9 oz) (01/12 1150)   Physical Exam  Constitutional: Obese female. Eyes: No scleral injection HENT: No neck rigidity. No carotid bruit was appreciated Head: Normocephalic.  Cardiovascular: Normal rate and regular rhythm.  Respiratory: Effort normal and breath sounds normal to anterior ascultation GI: Soft.  No distension. There is no tenderness.   Neuro: Mental Status: Patient is awake, alert, oriented to person, place, month, year, and situation. Patient is able to give somewhat good history but does appear to be mildly somnolent. No signs of aphasia or neglect Cranial Nerves: II: Visual Fields are full. Pupils are equal, round, and reactive to light.  III,IV, VI: EOMI without ptosis or diploplia.  V: Facial sensation is symmetric to temperature VII: Facial movement is symmetric.  VIII: hearing is intact to voice X: Uvula elevates symmetrically XI: Shoulder shrug is symmetric. XII: tongue is midline without atrophy or fasciculations.  Motor: Tone is normal. Bulk is normal. Very mild generalized weakness noted. RLE drift noted initially however with encouragement patient can lift it against gravity without any issues. There is significant pain in right leg at the hip joint per patient. Sensory: Sensation is symmetric to light touch Deep Tendon Reflexes: Absent throughout. Bilateral knees significant tenderness. Plantars: Toes are downgoing bilaterally. Cerebellar: FNF normal bilaterally. Has some trouble in both legs due to weakness.   I have reviewed the images obtained: CT head with Neuro Rad and there is no evidence of acute infarction.  Impression: 61 year old female presented as a code stroke with generalized weakness and hypotension. She was recently  discharged from the hospital and was treated for sepsis/pneumonia. Neurological examination is not consistent with TIA or a stroke at this time. She has right lower extremity chronic pain as well as back pain which explains the right lotion very slight drift.  Recommendations: 1) reexamination and if no improvement in mental status would likely pursue MRI of the brain to rule out stroke definitively. 2) medical management for hypotension 3) significant polypharmacy which needs attention possibly on an outpatient basis.

## 2016-02-29 NOTE — ED Triage Notes (Signed)
Pt from [facility] via [facility] as a code stroke with lethargy, slurred speech, and trouble hearing.  LSW was 1045 today at check in.  Pt is having slurred speech and right leg drift both with a hx of the same.  NAD.

## 2016-02-29 NOTE — ED Provider Notes (Signed)
[facility] DEPT Provider Note   CSN: [id number] Arrival date & time: 02/29/16  1123     History   Chief Complaint Chief Complaint  Patient presents with  . Code Stroke    HPI [name] is a 61 y.o. female.  HPI  61 year old brought in by EMS for trouble speaking and lethargy. Started about 1 hour prior to arrival. She was at her orthopedist office where she checked in and was acting normal. Some time while in the office she developed slurred speech, loss of hearing, and lethargy. Code stroke activated by EMS.  Past Medical History:  Diagnosis Date  . Anemia   . Anxiety   . Arthritis    "pretty much all my joints"  . Asthma   . Benign paroxysmal positional vertigo 12/30/2012  . Breast cancer ([place]) 02/13/12   ruq  100'clock bx Ductal Carcinoma in Situ,(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  . Breast mass in female    L breast 2008, US showed likely fat necrosis  . Chronic lower back pain   . CKD (chronic kidney disease), stage III    "lower stage" (01/06/2014)  . Daily headache   . Depression   . GERD (gastroesophageal reflux disease)   . History of blood transfusion 2011   "after one of my OR's"  . History of hiatal hernia   . History of stomach ulcers   . HSV (herpes simplex virus) infection   . Hx of radiation therapy 05/05/12- 07/15/12   right breast, 45 gray x 25 fx, lumpectomy cavity boosted to 16.2 gray  . Hyperlipemia   . Hypertension    sees Dr. Criss Rosales , [facility]  . Hypothyroidism   . Kidney stones   . Knee pain, bilateral   . Migraines    "~ 3 times/month" (01/06/2014)  . Obesity   . OSA on CPAP    pt does not know settings  . Pancreatitis    hx of  . Pneumonia 1950's  . Polyneuropathy in diabetes(357.2)   . Schizophrenia ([place])   . 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 Tachycardia    with sx in 2008  . Thyroid disease   . Type II diabetes mellitus [facility])     Patient Active Problem List   Diagnosis Date Noted  . Acute kidney injury ([place])  02/29/2016  . HCAP (healthcare-associated pneumonia) 02/29/2016  . Pneumonia 02/14/2016  . CAP (community acquired pneumonia) 02/13/2016  . Edema 02/13/2016  . AKI (acute kidney injury) ([place]) 02/13/2016  . Elevated troponin 06/24/2015  . Diarrhea 06/23/2015  . Sepsis ([place]) 06/23/2015  . Community acquired pneumonia 06/23/2015  . Obesity, morbid, BMI 50 or higher ([place]) 03/26/2015  . Post traumatic stress disorder (PTSD) 11/14/2014  . Acute encephalopathy 11/12/2014  . Leukocytosis 11/12/2014  . Altered mental status 11/12/2014  . Slurred speech 11/12/2014  . CKD (chronic kidney disease) stage 3, GFR 30-59 ml/min   . Diabetes mellitus, type II, insulin dependent ([place])   . TIA (transient ischemic attack)   . Hypotension 10/28/2014  . Syncope 10/28/2014  . Anemia 10/28/2014  . Type II diabetes mellitus ([place]) 10/28/2014  . OSA on CPAP 10/28/2014  . Chronic lower back pain 10/28/2014  . Polyneuropathy in diabetes(357.2) 10/28/2014  . Diabetic polyneuropathy ([place])   . Fall 08/14/2014  . Recurrent falls 08/14/2014  . Hot flashes 02/24/2014  . Arthralgia 02/24/2014  . Hair loss 02/24/2014  . Chest pain 01/06/2014  . Chronic back pain 01/06/2014  . Chest discomfort 09/02/2013  .  Shock ([place]) 08/08/2013  . 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 Dysarthria 12/30/2012  . Benign paroxysmal positional vertigo 12/30/2012  . Breast cancer of upper-outer quadrant of right female breast ([place]) 11/18/2012  . Hemorrhage of rectum and anus 09/28/2012  . Hx of radiation therapy   . Syncope and collapse 06/04/2011  . HSV 10/05/2008  . Asthma 06/02/2008  . HLD (hyperlipidemia) 06/02/2006  . Sinus tachycardia 05/25/2006  . Hypothyroidism 02/27/2006  . Obesity 02/27/2006  . Depression 02/27/2006  . Essential hypertension 02/27/2006  . GERD 02/27/2006  . KNEE PAIN 02/27/2006    Past Surgical History:  Procedure Laterality Date  . ABDOMINAL HYSTERECTOMY  1979?   partial  . BREAST  LUMPECTOMY  03/03/2012   Procedure: LUMPECTOMY;  Surgeon: Haywood Lasso, MD;  Location: [facility];  Service: General;  Laterality: Right;  . BREAST SURGERY Right 01/2012   "cancer"  . CHOLECYSTECTOMY  1980's?  . COLONOSCOPY WITH PROPOFOL N/A 09/28/2012   Procedure: COLONOSCOPY WITH PROPOFOL;  Surgeon: Lear Ng, MD;  Location: [facility] ENDOSCOPY;  Service: Endoscopy;  Laterality: N/A;  . FOOT FRACTURE SURGERY Right 1990's  . JOINT REPLACEMENT    . REVISION TOTAL KNEE ARTHROPLASTY  07/2009  . SHOULDER OPEN ROTATOR CUFF REPAIR Left 1990's  . THYROIDECTOMY  1970's  . TOTAL KNEE ARTHROPLASTY Bilateral 2009-06/2009   left; right    OB History    No data available       Home Medications    Prior to Admission medications   Medication Sig Start Date End Date Taking? Authorizing Provider  albuterol (PROVENTIL) (2.5 MG/3ML) 0.083% nebulizer solution Inhale 3 mLs into the lungs every 6 (six) hours as needed. For shortness of breath 11/03/15  Yes Historical Provider, MD  aspirin EC 81 MG tablet Take 1 tablet (81 mg total) by mouth daily. 08/12/13  Yes Adeline Saralyn Pilar, MD  B Complex-C (B-COMPLEX WITH VITAMIN C) tablet Take 1 tablet by mouth daily.   Yes Historical Provider, MD  carvedilol (COREG) 3.125 MG tablet Take 1 tablet (3.125 mg total) by mouth 2 (two) times daily with a meal. 02/17/16  Yes Florencia Reasons, MD  Cholecalciferol (VITAMIN D-3) 1000 units CAPS Take 1,000 Units by mouth daily.   Yes Historical Provider, MD  doxepin (SINEQUAN) 25 MG capsule Take 50 mg by mouth at bedtime.   Yes Historical Provider, MD  esomeprazole (NEXIUM) 40 MG capsule Take 1 capsule (40 mg total) by mouth daily before breakfast. Patient taking differently: Take 40 mg by mouth 2 (two) times daily before a meal.  10/30/14  Yes Barton Dubois, MD  ferrous sulfate 325 (65 FE) MG EC tablet Take 1 tablet (325 mg total) by mouth daily with breakfast. 02/17/16  Yes Florencia Reasons, MD  furosemide (LASIX) 40 MG tablet Take 1 tablet  (40 mg total) by mouth daily. 02/17/16  Yes Florencia Reasons, MD  guaiFENesin (MUCINEX) 600 MG 12 hr tablet Take 1 tablet (600 mg total) by mouth 2 (two) times daily. 02/17/16  Yes Florencia Reasons, MD  Insulin Detemir (LEVEMIR FLEXTOUCH) 100 UNIT/ML Pen Inject 10 Units into the skin at bedtime.   Yes Historical Provider, MD  insulin lispro (HUMALOG KWIKPEN) 100 UNIT/ML KiwkPen Inject 10 Units into the skin 3 (three) times daily.   Yes Historical Provider, MD  levothyroxine (SYNTHROID, LEVOTHROID) 150 MCG tablet Take 1 tablet (150 mcg total) by mouth daily before breakfast. 02/18/16  Yes Florencia Reasons, MD  LORazepam (ATIVAN) 2 MG tablet Take 1 tablet (2  mg total) by mouth every 12 (twelve) hours as needed for anxiety. Patient taking differently: Take 1-2 mg by mouth 2 (two) times daily. Take 1/2 tablet (1 mg) by mouth every morning and 1 tablet (2 mg) at bedtime 11/29/14  Yes [name] Carter, DO  LYRICA 100 MG capsule Take 100 mg by mouth 2 (two) times daily. 10/31/15  Yes Historical Provider, MD  oxyCODONE (ROXICODONE) 15 MG immediate release tablet Take 1 tablet (15 mg total) by mouth every 6 (six) hours as needed for pain. Patient taking differently: Take 15 mg by mouth every 4 (four) hours.  11/29/14  Yes Gildardo Cranker, DO  polyethylene glycol (MIRALAX / GLYCOLAX) packet Take 17 g by mouth daily. 02/18/16  Yes Florencia Reasons, MD  senna-docusate (SENOKOT-S) 8.6-50 MG tablet Take 2 tablets by mouth at bedtime. 02/17/16  Yes Florencia Reasons, MD  sitaGLIPtin-metformin (JANUMET) 50-500 MG per tablet Take 1 tablet by mouth 2 (two) times daily with a meal.   Yes Historical Provider, MD  Tapentadol HCl (NUCYNTA ER) 250 MG TB12 Take 250 mg by mouth every 12 (twelve) hours.   Yes Historical Provider, MD  tiZANidine (ZANAFLEX) 2 MG tablet Take 2 mg by mouth 2 (two) times daily.   Yes Historical Provider, MD  valACYclovir (VALTREX) 1000 MG tablet Take 1,000 mg by mouth 2 (two) times daily.    Yes Historical Provider, MD  valsartan (DIOVAN) 40 MG tablet  Take 1 tablet (40 mg total) by mouth daily. 02/17/16  Yes Florencia Reasons, MD  venlafaxine XR (EFFEXOR-XR) 150 MG 24 hr capsule Take 150 mg by mouth 2 (two) times daily. 05/21/15  Yes Historical Provider, MD  ziprasidone (GEODON) 40 MG capsule Take 40 mg by mouth daily after supper. 05/21/15  Yes Historical Provider, MD  levofloxacin (LEVAQUIN) 750 MG tablet Take 1 tablet (750 mg total) by mouth daily. 02/18/16   Florencia Reasons, MD    Family History Family History  Problem Relation Age of Onset  . Heart disease Brother     Multiple MIs, starting in his 40s  . Diabetes Brother   . Hypertension Mother   . Diabetes Mother   . Breast cancer Mother 11  . Bone cancer Mother   . Hypertension Sister   . Diabetes Sister   . Breast cancer Sister 43  . Breast cancer Maternal Grandmother   . Heart disease Maternal Grandmother   . Uterine cancer Other 19  . Breast cancer Paternal Aunt 38  . Breast cancer Paternal Grandmother     dx in her 28s  . Prostate cancer Paternal Grandfather     Social History Social History  Substance Use Topics  . Smoking status: Former Smoker    Packs/day: 1.00    Years: 7.00    Types: Cigarettes    Quit date: 03/24/1992  . Smokeless tobacco: Never Used  . Alcohol use No     Comment: "stopped drinking in 1996; I was an alcoholic"     Allergies   Bee venom; Shellfish allergy; Buprenorphine hcl; and Morphine and related   Review of Systems Review of Systems  Constitutional: Negative for fever.  Respiratory: Positive for cough. Negative for shortness of breath.   Cardiovascular: Negative for chest pain.  Gastrointestinal: Negative for abdominal pain, diarrhea and vomiting.  Endocrine: Positive for polyuria.  Genitourinary: Negative for dysuria.  Neurological: Positive for weakness. Negative for headaches.  Psychiatric/Behavioral: Positive for confusion.  All other systems reviewed and are negative.    Physical Exam Updated Vital  Signs BP 97/69 (BP Location: Left  Wrist)   Pulse 99   Temp 98.4 F (36.9 C) (Oral)   Resp 18   Ht _0  (1.651 m)   Wt (!) 346 lb 9 oz (157.2 kg)   SpO2 97%   BMI 57.67 kg/m   Physical Exam  Constitutional: She is oriented to person, place, and time. She appears well-developed and well-nourished.  Morbidly obese  HENT:  Head: Normocephalic and atraumatic.  Right Ear: External ear normal.  Left Ear: External ear normal.  Nose: Nose normal.  Mouth/Throat: Mucous membranes are dry.  Eyes: EOM are normal. Pupils are equal, round, and reactive to light. Right eye exhibits no discharge. Left eye exhibits no discharge.  No miosis  Cardiovascular: Normal rate, regular rhythm and normal heart sounds.   Pulmonary/Chest: Effort normal and breath sounds normal.  Abdominal: Soft. There is no tenderness.  Neurological: She is alert and oriented to person, place, and time.  Awake, alert. No facial droop. Slurred speech. 5/5 strength in BUE, LLE. 4/5 strength RLE, seems to also be due to pain  Skin: Skin is warm and dry.  Nursing note and vitals reviewed.    ED Treatments / Results  Labs (all labs ordered are listed, but only abnormal results are displayed) Labs Reviewed  CBC - Abnormal; Notable for the following:       Result Value   WBC 18.4 (*)    RBC 3.32 (*)    Hemoglobin 8.9 (*)    HCT 28.1 (*)    RDW 17.9 (*)    All other components within normal limits  DIFFERENTIAL - Abnormal; Notable for the following:    Neutro Abs 15.9 (*)    All other components within normal limits  COMPREHENSIVE METABOLIC PANEL - Abnormal; Notable for the following:    Sodium 132 (*)    Chloride 95 (*)    Glucose, Bld 195 (*)    BUN 21 (*)    Creatinine, Ser 1.82 (*)    Total Protein 6.0 (*)    Albumin 3.2 (*)    AST 47 (*)    GFR calc non Af Amer 29 (*)    GFR calc Af Amer 34 (*)    All other components within normal limits  URINALYSIS, ROUTINE W REFLEX MICROSCOPIC - Abnormal; Notable for the following:    Color, Urine  AMBER (*)    APPearance HAZY (*)    Bilirubin Urine SMALL (*)    All other components within normal limits  RAPID URINE DRUG SCREEN, HOSP PERFORMED - Abnormal; Notable for the following:    Opiates POSITIVE (*)    Benzodiazepines POSITIVE (*)    All other components within normal limits  CBG MONITORING, ED - Abnormal; Notable for the following:    Glucose-Capillary 189 (*)    All other components within normal limits  I-STAT CHEM 8, ED - Abnormal; Notable for the following:    Sodium 133 (*)    Chloride 96 (*)    BUN 23 (*)    Creatinine, Ser 1.80 (*)    Glucose, Bld 193 (*)    Calcium, Ion 1.09 (*)    Hemoglobin 9.2 (*)    HCT 27.0 (*)    All other components within normal limits  I-STAT ARTERIAL BLOOD GAS, ED - Abnormal; Notable for the following:    pO2, Arterial 60.0 (*)    All other components within normal limits  CULTURE, BLOOD (ROUTINE X 2)  CULTURE, BLOOD (ROUTINE  X 2)  MRSA PCR SCREENING  PROTIME-INR  APTT  TROPONIN I  LIPID PANEL  COMPREHENSIVE METABOLIC PANEL  CBC  INFLUENZA PANEL BY PCR (TYPE A & B, H1N1)  I-STAT TROPOININ, ED  I-STAT CG4 LACTIC ACID, ED    EKG  EKG Interpretation None       Radiology Dg Chest Portable 1 View  Result Date: 02/29/2016 CLINICAL DATA:  Lethargy.  Hypoxia. EXAM: PORTABLE CHEST 1 VIEW COMPARISON:  02/13/2016 chest radiographs.  02/14/2016 chest CT. FINDINGS: The cardiomediastinal silhouette is unchanged. Surgical clips are again seen in the right lower neck related to prior thyroidectomy. There is improved aeration of the right lung base. Ill-defined opacity persists in the left perihilar region. Left basilar opacity has improved. No sizable pleural effusion or pneumothorax is identified. IMPRESSION: Improved aeration of the lung bases. Persistent left midlung opacity concerning for pneumonia. Electronically Signed   By: Logan Bores M.D.   On: 02/29/2016 12:48   Ct Head Code Stroke W/o Cm  Addendum Date: 02/29/2016     ADDENDUM REPORT: 02/29/2016 12:05 ADDENDUM: Case discussed with Dr Alver Fisher via phone at 12:03 p.m. Electronically Signed   By: Monte Fantasia M.D.   On: 02/29/2016 12:05   Result Date: 02/29/2016 CLINICAL DATA:  Code stroke.  Slurred speech. EXAM: CT HEAD WITHOUT CONTRAST TECHNIQUE: Contiguous axial images were obtained from the base of the skull through the vertex without intravenous contrast. COMPARISON:  11/12/2014 FINDINGS: Brain: No evidence of acute infarction, hemorrhage, hydrocephalus, extra-axial collection or mass lesion/mass effect. Vascular: No hyperdense vessel or unexpected calcification. Skull: 14 mm low left scalp lesion with ill-defined margins measuring up to 14 mm. There may be skin retraction, although there also diffuse skin folds. Sinuses/Orbits: Chronic proptosis, known. Patient has history of thyroid disease in this could be related orbitopathy. Other: Page sent out at the time of interpretation on 02/29/2016 at 11:45 am to Dr. Alver Fisher. Text page with results sent out at 11:49 a.m. Awaiting callback verification. ASPECTS [facility] Stroke Program Early CT Score) - Ganglionic level infarction (caudate, lentiform nuclei, internal capsule, insula, M1-M3 cortex): 7 - Supraganglionic infarction (M4-M6 cortex): 3 Total score (0-10 with 10 being normal): 10 IMPRESSION: 1. No acute finding. ASPECTS is 10. 2. 14 mm low left scalp lesion which is new from 2016 and has an infiltrative appearance. This could be postinflammatory or neoplastic and skin exam is recommended. Electronically Signed: By: Monte Fantasia M.D. On: 02/29/2016 11:50    Procedures Procedures (including critical care time)   CRITICAL CARE Performed by: Sherwood Gambler T   Total critical care time: 30 minutes  Critical care time was exclusive of separately billable procedures and treating other patients.  Critical care was necessary to treat or prevent imminent or life-threatening deterioration.  Critical care was  time spent personally by me on the following activities: development of treatment plan with patient and/or surrogate as well as nursing, discussions with consultants, evaluation of patient's response to treatment, examination of patient, obtaining history from patient or surrogate, ordering and performing treatments and interventions, ordering and review of laboratory studies, ordering and review of radiographic studies, pulse oximetry and re-evaluation of patient's condition.   Medications Ordered in ED Medications  vancomycin (VANCOCIN) 2,500 mg in sodium chloride 0.9 % 500 mL IVPB (2,500 mg Intravenous New Bag/Given 02/29/16 1800)  enoxaparin (LOVENOX) injection 40 mg (not administered)  sodium chloride flush (NS) 0.9 % injection 3 mL (not administered)  0.9 %  sodium chloride infusion (not administered)  guaiFENesin (MUCINEX) 12 hr tablet 600 mg (not administered)  levothyroxine (SYNTHROID, LEVOTHROID) tablet 150 mcg (not administered)  albuterol (PROVENTIL) (2.5 MG/3ML) 0.083% nebulizer solution 3 mL (not administered)  insulin detemir (LEVEMIR) injection 10 Units (not administered)  venlafaxine XR (EFFEXOR-XR) 24 hr capsule 150 mg (not administered)  ziprasidone (GEODON) capsule 20 mg (not administered)  LORazepam (ATIVAN) tablet 0.5-1 mg (not administered)  oxyCODONE (Oxy IR/ROXICODONE) immediate release tablet 15 mg (not administered)  aspirin EC tablet 81 mg (not administered)  insulin aspart (novoLOG) injection 0-9 Units (not administered)  ceFEPIme (MAXIPIME) 2 g in dextrose 5 % 50 mL IVPB (not administered)  vancomycin (VANCOCIN) 1,750 mg in sodium chloride 0.9 % 500 mL IVPB (not administered)  sodium chloride 0.9 % bolus 1,000 mL (0 mLs Intravenous Stopped 02/29/16 1425)  sodium chloride 0.9 % bolus 1,000 mL (0 mLs Intravenous Stopped 02/29/16 1313)  sodium chloride 0.9 % bolus 1,000 mL (0 mLs Intravenous Stopped 02/29/16 1807)  ceFEPIme (MAXIPIME) 2 g in dextrose 5 % 50 mL IVPB (0 g  Intravenous Stopped 02/29/16 1809)     Initial Impression / Assessment and Plan / ED Course  I have reviewed the triage vital signs and the nursing notes.  Pertinent labs & imaging results that were available during my care of the patient were reviewed by me and considered in my medical decision making (see chart for details).  Clinical Course     Patient's code stroke was cancelled. RLE weakness chronic per patient, leg frequently "gives out on her". Also partially pain related. No new neuro findings besides slurred speech. She is awake and alert but a little sleepy. Could be polypharmacy as she has had issues with somnolence from meds multiple times in chart review. However also with AKI. Hypotensive on arrival. Given 2L of IV fluids with improvement in slurred speech and dry mouth feeling but BP still 90s, low 100s. I think this is probably hypovolemia as she was started on lasix a few weeks ago after PNA treatment. Still has cough but no dyspnea or fevers. CXR with improving infiltrate. No UTI or UTI symptoms. Lactate benign. After discussion with hospitalist, will cover with antibiotics for now given the hypotension part and continue fluids. Admit to stepdown.  Final Clinical Impressions(s) / ED Diagnoses   Final diagnoses:  Slurred speech  Acute kidney injury [facility])    New Prescriptions Current Discharge Medication List       Sherwood Gambler, MD 02/29/16 [phone]

## 2016-02-29 NOTE — ED Notes (Signed)
Patient is awake and able to follow commands to pass stoke swallow screen.

## 2016-02-29 NOTE — H&P (Addendum)
History and Physical    [name] PXT:[id number] DOB: August 11, 1955 DOA: 02/29/2016  PCP: [name], MD  Patient coming from: home  Chief Complaint: slurred speech  HPI: [name] is a 61 y.o. female with medical history significant of chronic kidney disease stage III, hypertension, hyperlipidemia, obesity, obstructive sleep apnea, she was recently hospitalized here for community-acquired pneumonia and discharged last week, was seen today in orthopedic surgery office and she was found to be hypotensive, have decreased mentation as well as slurred speech. She was brought into the hospital as a code stroke. She was evaluated by neurology, who felt that stroke is unlikely. Patient tells me that she's been having a productive cough ever since she left the hospital, she feels however it's improving. She denies any fever or chills at home. She complains of ongoing shortness of breath over the last week. She has no chest pain, she has no palpitations. She has no abdominal pain, nausea, vomiting or diarrhea. She complains of a flulike illness and diffuse muscle aches, but that's been going on for the last week. In the ED, she was found to be hypotensive, she was found to have a leukocytosis of 18 and an elevation in creatinine from 1-1.2 at baseline to 1.8. TRH was asked for admission for hypotension, potential ongoing pneumonia and slurred speech.   Review of Systems: As per HPI otherwise 10 point review of systems negative.   Past Medical History:  Diagnosis Date  . Anemia   . Anxiety   . Arthritis    "pretty much all my joints"  . Asthma   . Benign paroxysmal positional vertigo 12/30/2012  . Breast cancer ([place]) 02/13/12   ruq  100'clock bx Ductal Carcinoma in Situ,(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  . Breast mass in female    L breast 2008, US showed likely fat necrosis  . Chronic lower back pain   . CKD (chronic kidney disease), stage III    "lower stage" (01/06/2014)  . Daily headache     . Depression   . GERD (gastroesophageal reflux disease)   . History of blood transfusion 2011   "after one of my OR's"  . History of hiatal hernia   . History of stomach ulcers   . HSV (herpes simplex virus) infection   . Hx of radiation therapy 05/05/12- 07/15/12   right breast, 45 gray x 25 fx, lumpectomy cavity boosted to 16.2 gray  . Hyperlipemia   . Hypertension    sees Dr. Criss Rosales , [facility]  . Hypothyroidism   . Kidney stones   . Knee pain, bilateral   . Migraines    "~ 3 times/month" (01/06/2014)  . Obesity   . OSA on CPAP    pt does not know settings  . Pancreatitis    hx of  . Pneumonia 1950's  . Polyneuropathy in diabetes(357.2)   . Schizophrenia ([place])   . 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 Tachycardia    with sx in 2008  . Thyroid disease   . Type II diabetes mellitus ([place])     Past Surgical History:  Procedure Laterality Date  . ABDOMINAL HYSTERECTOMY  1979?   partial  . BREAST LUMPECTOMY  03/03/2012   Procedure: LUMPECTOMY;  Surgeon: Haywood Lasso, MD;  Location: [facility];  Service: General;  Laterality: Right;  . BREAST SURGERY Right 01/2012   "cancer"  . CHOLECYSTECTOMY  1980's?  . COLONOSCOPY WITH PROPOFOL N/A 09/28/2012   Procedure: COLONOSCOPY WITH PROPOFOL;  Surgeon: Lear Ng, MD;  Location: [facility];  Service: Endoscopy;  Laterality: N/A;  . FOOT FRACTURE SURGERY Right 1990's  . JOINT REPLACEMENT    . REVISION TOTAL KNEE ARTHROPLASTY  07/2009  . SHOULDER OPEN ROTATOR CUFF REPAIR Left 1990's  . THYROIDECTOMY  1970's  . TOTAL KNEE ARTHROPLASTY Bilateral 2009-06/2009   left; right     reports that she quit smoking about 23 years ago. Her smoking use included Cigarettes. She has a 7.00 pack-year smoking history. She has never used smokeless tobacco. She reports that she does not drink alcohol or use drugs.  Allergies  Allergen Reactions  . Bee Venom Anaphylaxis  . Shellfish Allergy Anaphylaxis  . Buprenorphine Hcl  Other (See Comments)    Overly sedated  . Morphine And Related Other (See Comments)    Overly sedated    Family History  Problem Relation Age of Onset  . Heart disease Brother     Multiple MIs, starting in his 41s  . Diabetes Brother   . Hypertension Mother   . Diabetes Mother   . Breast cancer Mother 54  . Bone cancer Mother   . Hypertension Sister   . Diabetes Sister   . Breast cancer Sister 79  . Breast cancer Maternal Grandmother   . Heart disease Maternal Grandmother   . Uterine cancer Other 19  . Breast cancer Paternal Aunt 56  . Breast cancer Paternal Grandmother     dx in her 16s  . Prostate cancer Paternal Grandfather     Prior to Admission medications   Medication Sig Start Date End Date Taking? Authorizing Provider  aspirin EC 81 MG tablet Take 1 tablet (81 mg total) by mouth daily. 08/12/13  Yes Adeline Saralyn Pilar, MD  carvedilol (COREG) 3.125 MG tablet Take 1 tablet (3.125 mg total) by mouth 2 (two) times daily with a meal. 02/17/16  Yes Florencia Reasons, MD  albuterol (PROVENTIL) (2.5 MG/3ML) 0.083% nebulizer solution Inhale 3 mLs into the lungs every 6 (six) hours as needed. For shortness of breath 11/03/15   Historical Provider, MD  B Complex-C (B-COMPLEX WITH VITAMIN C) tablet Take 1 tablet by mouth daily.    Historical Provider, MD  Cholecalciferol (VITAMIN D-3) 1000 units CAPS Take 1,000 Units by mouth daily.    Historical Provider, MD  doxepin (SINEQUAN) 25 MG capsule Take 50 mg by mouth at bedtime.    Historical Provider, MD  esomeprazole (NEXIUM) 40 MG capsule Take 1 capsule (40 mg total) by mouth daily before breakfast. Patient taking differently: Take 40 mg by mouth 2 (two) times daily before a meal.  10/30/14   Barton Dubois, MD  ferrous sulfate 325 (65 FE) MG EC tablet Take 1 tablet (325 mg total) by mouth daily with breakfast. 02/17/16   Florencia Reasons, MD  furosemide (LASIX) 40 MG tablet Take 1 tablet (40 mg total) by mouth daily. 02/17/16   Florencia Reasons, MD  guaiFENesin  (MUCINEX) 600 MG 12 hr tablet Take 1 tablet (600 mg total) by mouth 2 (two) times daily. 02/17/16   Florencia Reasons, MD  Insulin Detemir (LEVEMIR FLEXTOUCH) 100 UNIT/ML Pen Inject 10 Units into the skin at bedtime.    Historical Provider, MD  insulin lispro (HUMALOG KWIKPEN) 100 UNIT/ML KiwkPen Inject 10 Units into the skin 3 (three) times daily.    Historical Provider, MD  levofloxacin (LEVAQUIN) 750 MG tablet Take 1 tablet (750 mg total) by mouth daily. 02/18/16   Florencia Reasons, MD  levothyroxine (SYNTHROID, [place])  150 MCG tablet Take 1 tablet (150 mcg total) by mouth daily before breakfast. 02/18/16   Florencia Reasons, MD  LORazepam (ATIVAN) 2 MG tablet Take 1 tablet (2 mg total) by mouth every 12 (twelve) hours as needed for anxiety. Patient taking differently: Take 1-2 mg by mouth 2 (two) times daily. Take 1/2 tablet (1 mg) by mouth every morning and 1 tablet (2 mg) at bedtime 11/29/14   Gildardo Cranker, DO  LYRICA 100 MG capsule Take 100 mg by mouth 2 (two) times daily. 10/31/15   Historical Provider, MD  oxyCODONE (ROXICODONE) 15 MG immediate release tablet Take 1 tablet (15 mg total) by mouth every 6 (six) hours as needed for pain. Patient taking differently: Take 15 mg by mouth every 4 (four) hours.  11/29/14   Gildardo Cranker, DO  polyethylene glycol (MIRALAX / GLYCOLAX) packet Take 17 g by mouth daily. 02/18/16   Florencia Reasons, MD  senna-docusate (SENOKOT-S) 8.6-50 MG tablet Take 2 tablets by mouth at bedtime. 02/17/16   Florencia Reasons, MD  sitaGLIPtin-metformin (JANUMET) 50-500 MG per tablet Take 1 tablet by mouth 2 (two) times daily with a meal.    Historical Provider, MD  Tapentadol HCl (NUCYNTA ER) 250 MG TB12 Take 250 mg by mouth every 12 (twelve) hours.    Historical Provider, MD  tiZANidine (ZANAFLEX) 2 MG tablet Take 2 mg by mouth 2 (two) times daily.    Historical Provider, MD  valACYclovir (VALTREX) 1000 MG tablet Take 1,000 mg by mouth 2 (two) times daily.     Historical Provider, MD  valsartan (DIOVAN) 40 MG tablet  Take 1 tablet (40 mg total) by mouth daily. 02/17/16   Florencia Reasons, MD  venlafaxine XR (EFFEXOR-XR) 150 MG 24 hr capsule Take 150 mg by mouth 2 (two) times daily. 05/21/15   Historical Provider, MD  ziprasidone (GEODON) 40 MG capsule Take 40 mg by mouth daily after supper. 05/21/15   Historical Provider, MD    Physical Exam: Vitals:   02/29/16 1415 02/29/16 1600 02/29/16 1605 02/29/16 1615  BP: 99/56 107/56  (!) 90/47  Pulse: 95 98  101  Resp: _0 Temp:   98.9 F (37.2 C)   TempSrc:      SpO2: 94% 93%  96%  Weight:      Height:          Constitutional: NAD, calm, comfortable, a bit slow but alert and oriented 4 Vitals:   02/29/16 1415 02/29/16 1600 02/29/16 1605 02/29/16 1615  BP: 99/56 107/56  (!) 90/47  Pulse: 95 98  101  Resp: _1 Temp:   98.9 F (37.2 C)   TempSrc:      SpO2: 94% 93%  96%  Weight:      Height:       Eyes: PERRL, lids and conjunctivae normal ENMT: Mucous membranes are moist.  Respiratory: clear to auscultation bilaterally, no wheezing, no crackles. Difficult exam due to obesity Cardiovascular: Regular rate and rhythm, no murmurs / rubs / gallops. No extremity edema. 2+ pedal pulses.  Abdomen: no tenderness, no masses palpated. Bowel sounds positive.  Musculoskeletal: no clubbing / cyanosis. Normal muscle tone.  Skin: no rashes, lesions, ulcers. No induration Neurologic: CN 2-12 grossly intact. Strength 5/5 in all 4.  Psychiatric: Normal judgment and insight.  Labs on Admission: I have personally reviewed following labs and imaging studies  CBC:  Recent Labs Lab 02/29/16 1127 02/29/16 1133  WBC 18.4*  --  NEUTROABS 15.9*  --   HGB 8.9* 9.2*  HCT 28.1* 27.0*  MCV 84.6  --   PLT 305  --    Basic Metabolic Panel:  Recent Labs Lab 02/29/16 1127 02/29/16 1133  NA 132* 133*  K 4.2 4.1  CL 95* 96*  CO2 24  --   GLUCOSE 195* 193*  BUN 21* 23*  CREATININE 1.82* 1.80*  CALCIUM 8.9  --    GFR: Estimated Creatinine Clearance:  51.6 mL/min (by C-G formula based on SCr of 1.8 mg/dL (H)). Liver Function Tests:  Recent Labs Lab 02/29/16 1127  AST 47*  ALT 27  ALKPHOS 67  BILITOT 0.3  PROT 6.0*  ALBUMIN 3.2*   No results for input(s): LIPASE, AMYLASE in the last 168 hours. No results for input(s): AMMONIA in the last 168 hours. Coagulation Profile:  Recent Labs Lab 02/29/16 1127  INR 1.04   Cardiac Enzymes:  Recent Labs Lab 02/29/16 1127  TROPONINI <0.03   BNP (last 3 results) No results for input(s): PROBNP in the last 8760 hours. HbA1C: No results for input(s): HGBA1C in the last 72 hours. CBG:  Recent Labs Lab 02/29/16 1144  GLUCAP 189*   Lipid Profile:  Recent Labs  02/29/16 1127  CHOL 111  HDL 55  LDLCALC 35  TRIG 104  CHOLHDL 2.0   Thyroid Function Tests: No results for input(s): TSH, T4TOTAL, FREET4, T3FREE, THYROIDAB in the last 72 hours. Anemia Panel: No results for input(s): VITAMINB12, FOLATE, FERRITIN, TIBC, IRON, RETICCTPCT in the last 72 hours. Urine analysis:    Component Value Date/Time   COLORURINE AMBER (A) 02/29/2016 1611   APPEARANCEUR HAZY (A) 02/29/2016 1611   APPEARANCEUR Clear 11/13/2013 1310   LABSPEC 1.019 02/29/2016 1611   LABSPEC 1.017 11/13/2013 1310   PHURINE 5.0 02/29/2016 1611   GLUCOSEU NEGATIVE 02/29/2016 1611   GLUCOSEU Negative 11/13/2013 1310   GLUCOSEU NEG mg/dL 03/02/2006 2050   HGBUR NEGATIVE 02/29/2016 1611   BILIRUBINUR SMALL (A) 02/29/2016 1611   BILIRUBINUR Negative 11/13/2013 1310   KETONESUR NEGATIVE 02/29/2016 1611   PROTEINUR NEGATIVE 02/29/2016 1611   UROBILINOGEN 0.2 11/12/2014 2057   NITRITE NEGATIVE 02/29/2016 1611   LEUKOCYTESUR NEGATIVE 02/29/2016 1611   LEUKOCYTESUR Trace 11/13/2013 1310   Sepsis Labs: _0 (procalcitonin:4,lacticidven:4) )No results found for this or any previous visit (from the past 240 hour(s)).   Radiological Exams on Admission: Dg Chest Portable 1 View  Result Date:  02/29/2016 CLINICAL DATA:  Lethargy.  Hypoxia. EXAM: PORTABLE CHEST 1 VIEW COMPARISON:  02/13/2016 chest radiographs.  02/14/2016 chest CT. FINDINGS: The cardiomediastinal silhouette is unchanged. Surgical clips are again seen in the right lower neck related to prior thyroidectomy. There is improved aeration of the right lung base. Ill-defined opacity persists in the left perihilar region. Left basilar opacity has improved. No sizable pleural effusion or pneumothorax is identified. IMPRESSION: Improved aeration of the lung bases. Persistent left midlung opacity concerning for pneumonia. Electronically Signed   By: Logan Bores M.D.   On: 02/29/2016 12:48   Ct Head Code Stroke W/o Cm  Addendum Date: 02/29/2016   ADDENDUM REPORT: 02/29/2016 12:05 ADDENDUM: Case discussed with Dr Alver Fisher via phone at 12:03 p.m. Electronically Signed   By: Monte Fantasia M.D.   On: 02/29/2016 12:05   Result Date: 02/29/2016 CLINICAL DATA:  Code stroke.  Slurred speech. EXAM: CT HEAD WITHOUT CONTRAST TECHNIQUE: Contiguous axial images were obtained from the base of the skull through the vertex without intravenous contrast. COMPARISON:  11/12/2014  FINDINGS: Brain: No evidence of acute infarction, hemorrhage, hydrocephalus, extra-axial collection or mass lesion/mass effect. Vascular: No hyperdense vessel or unexpected calcification. Skull: 14 mm low left scalp lesion with ill-defined margins measuring up to 14 mm. There may be skin retraction, although there also diffuse skin folds. Sinuses/Orbits: Chronic proptosis, known. Patient has history of thyroid disease in this could be related orbitopathy. Other: Page sent out at the time of interpretation on 02/29/2016 at 11:45 am to Dr. Alver Fisher. Text page with results sent out at 11:49 a.m. Awaiting callback verification. ASPECTS [facility] Stroke Program Early CT Score) - Ganglionic level infarction (caudate, lentiform nuclei, internal capsule, insula, M1-M3 cortex): 7 - Supraganglionic  infarction (M4-M6 cortex): 3 Total score (0-10 with 10 being normal): 10 IMPRESSION: 1. No acute finding. ASPECTS is 10. 2. 14 mm low left scalp lesion which is new from 2016 and has an infiltrative appearance. This could be postinflammatory or neoplastic and skin exam is recommended. Electronically Signed: By: Monte Fantasia M.D. On: 02/29/2016 11:50    EKG: Independently reviewed. Sinus rhythm  Assessment/Plan Active Problems:   Hypothyroidism   HLD (hyperlipidemia)   Dysarthria   Chronic back pain   Hypotension   Altered mental status   CKD (chronic kidney disease) stage 3, GFR 30-59 ml/min   Diabetes mellitus, type II, insulin dependent (HCC)   Sepsis ([place])   AKI (acute kidney injury) ([place])   Acute kidney injury ([place])   HCAP (healthcare-associated pneumonia)   Early sepsis due to HCAP - Patient with elevated white count 18, chest x-ray and symptoms consistent with ongoing pneumonia, she is hypotensive, we'll admit patient to step down, provide vancomycin and cefepime for healthcare associated pneumonia, rule out flu. Provide IV fluids. Closely monitor.  Acute hypoxic respiratory failure - Oxygen as needed, her PO2 was low on ABG.   Slurred speech - Likely in the setting of hypotension, will rule out CVA with an MRI of the brain. Appreciate neurology input. Continue aspirin.  Acute kidney injury and chronic kidney disease stage III - Likely in the setting of sepsis and poor by mouth intake over the last week at home. Hold her ARB. Provide IV fluids, repeat renal function in the morning  Chronic pain - Concern for overmedication, continue home medications at lower doses.  Type 2 diabetes mellitus - Continue home Levemir, sliding scale insulin  Acute encephalopathy - Patient is a bit slow in the emergency room, however she is alert and oriented 4.  Obstructive sleep apnea - Continue nightly CPAP   DVT prophylaxis: Lovenox  Code Status: partial, no intubation  Family  Communication: no family bedside Disposition Plan: admit to SDU Consults called: none  Admission status: inpatient, will require sepsis treatment, renal failure treatment and close monitoring in SDU    Marzetta Board, MD [facility] Pager [phone]  If 7PM-7AM, please contact night-coverage [email or web address] Password [facility]  02/29/2016, 5:14 PM

## 2016-02-29 NOTE — Progress Notes (Signed)
Office Visit Note   Patient: [name]           Date of Birth: 03-09-1955           MRN: [id number] Visit Date: 02/29/2016              Requested by: [name], MD [address] [place], [place] [address] PCP: [name], MD  Chief Complaint  Patient presents with  . Left Knee - Injury    10/28/12 s/p left total knee replacement     HPI: HPI  Assessment & Plan: Visit Diagnoses:  1. Unresponsive episode     Plan: Patient was sent by EMS to the emergency room.  Follow-Up Instructions: No Follow-up on file.   Ortho Exam Patient was initially unresponsive in time she was able to answer questions she had slurred speech. She was not examined orthopedically today and was sent by EMS to the emergency room urgently.  Imaging: Ct Head Code Stroke W/o Cm  Result Date: 02/29/2016 CLINICAL DATA:  Code stroke.  Slurred speech. EXAM: CT HEAD WITHOUT CONTRAST TECHNIQUE: Contiguous axial images were obtained from the base of the skull through the vertex without intravenous contrast. COMPARISON:  11/12/2014 FINDINGS: Brain: No evidence of acute infarction, hemorrhage, hydrocephalus, extra-axial collection or mass lesion/mass effect. Vascular: No hyperdense vessel or unexpected calcification. Skull: 14 mm low left scalp lesion with ill-defined margins measuring up to 14 mm. There may be skin retraction, although there also diffuse skin folds. Sinuses/Orbits: Chronic proptosis, known. Patient has history of thyroid disease in this could be related orbitopathy. Other: Page sent out at the time of interpretation on 02/29/2016 at 11:45 am to Dr. Alver Fisher. Text page with results sent out at 11:49 a.m. Awaiting callback verification. ASPECTS [facility] Stroke Program Early CT Score) - Ganglionic level infarction (caudate, lentiform nuclei, internal capsule, insula, M1-M3 cortex): 7 - Supraganglionic infarction (M4-M6 cortex): 3 Total score (0-10 with 10 being normal): 10 IMPRESSION:  1. No acute finding. ASPECTS is 10. 2. 14 mm low left scalp lesion which is new from 2016 and has an infiltrative appearance. This could be postinflammatory or neoplastic and skin exam is recommended. Electronically Signed   By: Monte Fantasia M.D.   On: 02/29/2016 11:50    Orders:  No orders of the defined types were placed in this encounter.  No orders of the defined types were placed in this encounter.    Procedures: No procedures performed  Clinical Data: No additional findings.  Subjective: Review of Systems  Objective: Vital Signs: Ht _0  (1.651 m)   Wt (!) 343 lb (155.6 kg)   BMI 57.08 kg/m   Specialty Comments:  No specialty comments available.  PMFS History: Patient Active Problem List   Diagnosis Date Noted  . Pneumonia 02/14/2016  . CAP (community acquired pneumonia) 02/13/2016  . Edema 02/13/2016  . AKI (acute kidney injury) ([place]) 02/13/2016  . Elevated troponin 06/24/2015  . Diarrhea 06/23/2015  . Sepsis ([place]) 06/23/2015  . Community acquired pneumonia 06/23/2015  . Obesity, morbid, BMI 50 or higher ([place]) 03/26/2015  . Post traumatic stress disorder (PTSD) 11/14/2014  . Acute encephalopathy 11/12/2014  . Leukocytosis 11/12/2014  . Altered mental status 11/12/2014  . Slurred speech 11/12/2014  . CKD (chronic kidney disease), stage III   . Diabetes mellitus, type II, insulin dependent ([place])   . TIA (transient ischemic attack)   . Hypotension 10/28/2014  . Syncope 10/28/2014  . Anemia 10/28/2014  . Type  II diabetes mellitus ([place]) 10/28/2014  . OSA on CPAP 10/28/2014  . Chronic lower back pain 10/28/2014  . Polyneuropathy in diabetes(357.2) 10/28/2014  . Diabetic polyneuropathy ([place])   . Fall 08/14/2014  . Recurrent falls 08/14/2014  . Hot flashes 02/24/2014  . Arthralgia 02/24/2014  . Hair loss 02/24/2014  . Chest pain 01/06/2014  . Chronic back pain 01/06/2014  . Chest discomfort 09/02/2013  . Shock ([place]) 08/08/2013  . Secondary  parkinsonism ([place]) 12/30/2012  . Dysarthria 12/30/2012  . Benign paroxysmal positional vertigo 12/30/2012  . Breast cancer of upper-outer quadrant of right female breast ([place]) 11/18/2012  . Hemorrhage of rectum and anus 09/28/2012  . Hx of radiation therapy   . Syncope and collapse 06/04/2011  . HSV 10/05/2008  . Asthma 06/02/2008  . HLD (hyperlipidemia) 06/02/2006  . Sinus tachycardia 05/25/2006  . Hypothyroidism 02/27/2006  . Obesity 02/27/2006  . Depression 02/27/2006  . Essential hypertension 02/27/2006  . GERD 02/27/2006  . KNEE PAIN 02/27/2006   Past Medical History:  Diagnosis Date  . Anemia   . Anxiety   . Arthritis    "pretty much all my joints"  . Asthma   . Benign paroxysmal positional vertigo 12/30/2012  . Breast cancer ([place]) 02/13/12   ruq  100'clock bx Ductal Carcinoma in Situ,(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  . Breast mass in female    L breast 2008, US showed likely fat necrosis  . Chronic lower back pain   . CKD (chronic kidney disease), stage III    "lower stage" (01/06/2014)  . Daily headache   . Depression   . GERD (gastroesophageal reflux disease)   . History of blood transfusion 2011   "after one of my OR's"  . History of hiatal hernia   . History of stomach ulcers   . HSV (herpes simplex virus) infection   . Hx of radiation therapy 05/05/12- 07/15/12   right breast, 45 gray x 25 fx, lumpectomy cavity boosted to 16.2 gray  . Hyperlipemia   . Hypertension    sees Dr. Criss Rosales , [facility]  . Hypothyroidism   . Kidney stones   . Knee pain, bilateral   . Migraines    "~ 3 times/month" (01/06/2014)  . Obesity   . OSA on CPAP    pt does not know settings  . Pancreatitis    hx of  . Pneumonia 1950's  . Polyneuropathy in diabetes(357.2)   . Schizophrenia ([place])   . 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 Tachycardia    with sx in 2008  . Thyroid disease   . Type II diabetes mellitus (HCC)     Family History  Problem Relation Age of Onset  . Heart  disease Brother     Multiple MIs, starting in his 53s  . Diabetes Brother   . Hypertension Mother   . Diabetes Mother   . Breast cancer Mother 64  . Bone cancer Mother   . Hypertension Sister   . Diabetes Sister   . Breast cancer Sister 75  . Breast cancer Maternal Grandmother   . Heart disease Maternal Grandmother   . Uterine cancer Other 19  . Breast cancer Paternal Aunt 58  . Breast cancer Paternal Grandmother     dx in her 32s  . Prostate cancer Paternal Grandfather     Past Surgical History:  Procedure Laterality Date  . ABDOMINAL HYSTERECTOMY  1979?   partial  . BREAST LUMPECTOMY  03/03/2012   Procedure: LUMPECTOMY;  Surgeon: Darrick Meigs  Fanny Bien, MD;  Location: [facility];  Service: General;  Laterality: Right;  . BREAST SURGERY Right 01/2012   "cancer"  . CHOLECYSTECTOMY  1980's?  . COLONOSCOPY WITH PROPOFOL N/A 09/28/2012   Procedure: COLONOSCOPY WITH PROPOFOL;  Surgeon: Lear Ng, MD;  Location: [facility];  Service: Endoscopy;  Laterality: N/A;  . FOOT FRACTURE SURGERY Right 1990's  . JOINT REPLACEMENT    . REVISION TOTAL KNEE ARTHROPLASTY  07/2009  . SHOULDER OPEN ROTATOR CUFF REPAIR Left 1990's  . THYROIDECTOMY  1970's  . TOTAL KNEE ARTHROPLASTY Bilateral 2009-06/2009   left; right   Social History   Occupational History  . Disabled    Social History Main Topics  . Smoking status: Former Smoker    Packs/day: 1.00    Years: 7.00    Types: Cigarettes    Quit date: 03/24/1992  . Smokeless tobacco: Never Used  . 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 Drug use: No  . Sexual activity: No

## 2016-03-01 ENCOUNTER — Inpatient Hospital Stay (HOSPITAL_COMMUNITY): Payer: Medicare Other

## 2016-03-01 ENCOUNTER — Encounter (HOSPITAL_COMMUNITY): Payer: Self-pay | Admitting: *Deleted

## 2016-03-01 DIAGNOSIS — R531 Weakness: Secondary | ICD-10-CM

## 2016-03-01 DIAGNOSIS — R4182 Altered mental status, unspecified: Secondary | ICD-10-CM

## 2016-03-01 LAB — GLUCOSE, CAPILLARY
GLUCOSE-CAPILLARY: 142 mg/dL — AB (ref 65–99)
GLUCOSE-CAPILLARY: 137 mg/dL — AB (ref 65–99)
Glucose-Capillary: 121 mg/dL — ABNORMAL HIGH (ref 65–99)
Glucose-Capillary: 182 mg/dL — ABNORMAL HIGH (ref 65–99)

## 2016-03-01 LAB — COMPREHENSIVE METABOLIC PANEL
ALT: 22 U/L (ref 14–54)
AST: 23 U/L (ref 15–41)
Albumin: 2.6 g/dL — ABNORMAL LOW (ref 3.5–5.0)
Alkaline Phosphatase: 63 U/L (ref 38–126)
Anion gap: 9 (ref 5–15)
BUN: 22 mg/dL — ABNORMAL HIGH (ref 6–20)
CHLORIDE: 101 mmol/L (ref 101–111)
CO2: 26 mmol/L (ref 22–32)
Calcium: 8.5 mg/dL — ABNORMAL LOW (ref 8.9–10.3)
Creatinine, Ser: 1.78 mg/dL — ABNORMAL HIGH (ref 0.44–1.00)
GFR, EST AFRICAN AMERICAN: 35 mL/min — AB (ref >60)
GFR, EST NON AFRICAN AMERICAN: 30 mL/min — AB (ref >60)
Glucose, Bld: 117 mg/dL — ABNORMAL HIGH (ref 65–99)
POTASSIUM: 4.2 mmol/L (ref 3.5–5.1)
Sodium: 136 mmol/L (ref 135–145)
Total Bilirubin: 0.6 mg/dL (ref 0.3–1.2)
Total Protein: 5.5 g/dL — ABNORMAL LOW (ref 6.5–8.1)

## 2016-03-01 LAB — CBC
HEMATOCRIT: 24.1 % — AB (ref 36.0–46.0)
Hemoglobin: 7.5 g/dL — ABNORMAL LOW (ref 12.0–15.0)
MCH: 26.3 pg (ref 26.0–34.0)
MCHC: 31.1 g/dL (ref 30.0–36.0)
MCV: 84.6 fL (ref 78.0–100.0)
Platelets: 303 10*3/uL (ref 150–400)
RBC: 2.85 MIL/uL — AB (ref 3.87–5.11)
RDW: 17.6 % — ABNORMAL HIGH (ref 11.5–15.5)
WBC: 17 10*3/uL — AB (ref 4.0–10.5)

## 2016-03-01 MED ORDER — GADOBENATE DIMEGLUMINE 529 MG/ML IV SOLN
20.0000 mL | Freq: Once | INTRAVENOUS | Status: AC | PRN
  Administered 2016-03-01: 20 mL via INTRAVENOUS

## 2016-03-01 MED ORDER — ENOXAPARIN SODIUM 80 MG/0.8ML ~~LOC~~ SOLN
75.0000 mg | SUBCUTANEOUS | Status: DC
  Administered 2016-03-01 – 2016-03-02 (×2): 75 mg via SUBCUTANEOUS
  Filled 2016-03-01 (×2): qty 0.8

## 2016-03-01 NOTE — Progress Notes (Signed)
Subjective: Pt presented with slurring of speech and right leg weakness. She was hypotensive in Er. She was excluded from tPA due to unclear diagnosis of stroke. Today she reports her speech is much improved and right leg still feels painful to move. Also complaining of back pain. No bowel or bladder concerns.  Exam: Vitals:   03/01/16 0408 03/01/16 0741  BP: (!) 106/58 (!) 131/58  Pulse: 100 (!) 105  Resp: 11 15  Temp:  98.9 F (37.2 C)   Gen: In bed, NAD Resp: non-labored breathing, no acute distress Abd: soft, nt  Neuro: MS: Awake, alert and oriented x 3 Cn: II-XII grossly intact. Motor: 5/5 throughout except proximal RLE weakness Sensory: Normal throughout. DTR: Absent in knees but had knee replacement bilateral. Left knee is tendered when tested for DTRs.  Impression: 61 years old female presented with slurring of speech and right leg weakness. Slurring of speech was likely due to hypotension and RLE weakness and pain is chronic. She had MRI lumbar spine in June 2016 which showed multiple levels of disc disease. I think the disc disease has probably progressed.  Recommendations: -MRI of lumbar spine to rule out any lumbar disc disease (less likely to be cord compression).

## 2016-03-01 NOTE — Evaluation (Addendum)
Physical Therapy Evaluation Patient Details Name: [name] MRN: [id number] DOB: Jan 28, 1956 Today's Date: 03/01/2016   History of Present Illness  Pt is a 61 y/o female admitted secondary to slurred speech, originally thought to have had a stroke; however, found to have early sepsis secondary to HCAP. Of note, pt recently admitted and discharged on 02/17/16. PMH including but not limited to DM, hypothyroidism, morbid obesity, DM, HTN, HLD, CKD, breast cancer and schizophrenia.  Clinical Impression  Pt presented supine in bed with HOB elevated, awake and willing to participate in therapy session. Prior to admission, pt reported that she was mod I with use of rollator to ambulate and needed assistance with bathing and dressing. Pt reported that she has an aide that comes to her home 7 days/week for 3 hours. Pt currently very limited secondary to fatigue and pain in R wrist. Pt would continue to benefit from skilled physical therapy services at this time while admitted and after d/c to address her below listed limitations in order to improve her overall safety and independence with functional mobility.     Follow Up Recommendations Home health PT;Supervision/Assistance - 24 hour    Equipment Recommendations  None recommended by PT    Recommendations for Other Services       Precautions / Restrictions Restrictions Weight Bearing Restrictions: No      Mobility  Bed Mobility Overal bed mobility: Needs Assistance Bed Mobility: Rolling;Sidelying to Sit;Sit to Sidelying Rolling: Min guard Sidelying to sit: Min guard     Sit to sidelying: Min assist General bed mobility comments: pt required increased time, use of bed rails and min guard for safety   Transfers Overall transfer level: Needs assistance Equipment used: 4-wheeled walker Transfers: Sit to/from Stand Sit to Stand: Min assist         General transfer comment: pt required increased time, min guard for  safety  Ambulation/Gait Ambulation/Gait assistance: Min guard Ambulation Distance (Feet): 20 Feet Assistive device: 4-wheeled walker Gait Pattern/deviations: Step-through pattern;Decreased step length - right;Decreased step length - left;Decreased stride length;Shuffle;Trunk flexed;Wide base of support;Antalgic Gait velocity: decreased Gait velocity interpretation: Below normal speed for age/gender General Gait Details: pt using R forearm on rollator secondary to pain in wrist. pt with an antaglic gait pattern and very flexed trunk.  Stairs            Wheelchair Mobility    Modified Rankin (Stroke Patients Only)       Balance Overall balance assessment: Needs assistance Sitting-balance support: Feet supported Sitting balance-Leahy Scale: Fair     Standing balance support: Bilateral upper extremity supported;During functional activity Standing balance-Leahy Scale: Poor Standing balance comment: Requires UE support for standing balance.                             Pertinent Vitals/Pain Pain Assessment: Faces Faces Pain Scale: Hurts even more Pain Location: R wrist and L knee Pain Descriptors / Indicators: Sore;Grimacing;Guarding Pain Intervention(s): Monitored during session   All VSS throughout session.     Home Living Family/patient expects to be discharged to:: Private residence Living Arrangements: Alone Available Help at Discharge: Family;Available PRN/intermittently;Personal care attendant Type of Home: House Home Access: Stairs to enter Entrance Stairs-Rails: None Entrance Stairs-Number of Steps: 1 Home Layout: One level Home Equipment: Cane - single point;Shower seat;Walker - 4 wheels      Prior Function Level of Independence: Needs assistance   Gait / Transfers Assistance Needed: pt  reported that she ambulates with use of rollator  ADL's / Homemaking Assistance Needed: pt reported that caregiver assists with bathing and dressing         Hand Dominance   Dominant Hand: Right    Extremity/Trunk Assessment   Upper Extremity Assessment Upper Extremity Assessment: RUE deficits/detail RUE: Unable to fully assess due to pain    Lower Extremity Assessment Lower Extremity Assessment: Generalized weakness       Communication   Communication: No difficulties  Cognition Arousal/Alertness: Awake/alert Behavior During Therapy: WFL for tasks assessed/performed Overall Cognitive Status: Within Functional Limits for tasks assessed                      General Comments      Exercises     Assessment/Plan    PT Assessment Patient needs continued PT services  PT Problem List Decreased strength;Decreased range of motion;Decreased activity tolerance;Decreased balance;Decreased mobility;Decreased coordination;Decreased knowledge of use of DME;Decreased safety awareness;Pain          PT Treatment Interventions DME instruction;Gait training;Stair training;Functional mobility training;Therapeutic activities;Therapeutic exercise;Balance training;Neuromuscular re-education;Patient/family education    PT Goals (Current goals can be found in the Care Plan section)  Acute Rehab PT Goals Patient Stated Goal: decrease pain PT Goal Formulation: With patient Time For Goal Achievement: 03/15/16 Potential to Achieve Goals: Fair    Frequency Min 3X/week   Barriers to discharge Decreased caregiver support      Co-evaluation               End of Session Equipment Utilized During Treatment: Gait belt Activity Tolerance: Patient limited by fatigue;Patient limited by pain Patient left: in bed;with call bell/phone within reach;with bed alarm set Nurse Communication: Mobility status         Time: 9390-3009 PT Time Calculation (min) (ACUTE ONLY): 16 min   Charges:   PT Evaluation $PT Eval Moderate Complexity: 1 Procedure     PT G CodesClearnce Sorrel Sachin Ferencz 03/01/2016, 5:38 PM Sherie Don, [place],  DPT [phone]

## 2016-03-01 NOTE — Progress Notes (Signed)
Patient placed on CPAP nasal with 2l O2 bleed in on CPAP of 6 CM H20. Tolerating well.

## 2016-03-01 NOTE — Progress Notes (Signed)
PROGRESS NOTE  [name] FRE:[id number] DOB: 04-01-1955 DOA: 02/29/2016 PCP: [name], MD   LOS: 1 day   Brief Narrative: 61 y.o. female with medical history significant of chronic kidney disease stage III, hypertension, hyperlipidemia, obesity, obstructive sleep apnea, she was recently hospitalized here for community-acquired pneumonia and discharged last week, was seen 1/12 in orthopedic surgery office and she was found to be hypotensive, have decreased mentation as well as slurred speech.  Assessment & Plan: Active Problems:   Hypothyroidism   HLD (hyperlipidemia)   Dysarthria   Chronic back pain   Hypotension   Altered mental status   CKD (chronic kidney disease) stage 3, GFR 30-59 ml/min   Diabetes mellitus, type II, insulin dependent (HCC)   Sepsis ([place])   AKI (acute kidney injury) ([place])   Acute kidney injury ([place])   HCAP (healthcare-associated pneumonia)   Sepsis due to HCAP - Patient with elevated white count 18, chest x-ray and symptoms consistent with ongoing pneumonia, she was hypotensive in the ED and admitted to SDU. Blood pressure improved, transferred to telemetry 1/13. Good po intake, discontinue fluids.  Acute hypoxic respiratory failure - Oxygen as needed, her PO2 was low on ABG. Wean off oxygen as tolerated Slurred speech - Likely in the setting of hypotension, MRI of the brain negative for stroke Back pain - MRI L spine pending. Acute on chronic Acute kidney injury and chronic kidney disease stage III - Likely in the setting of sepsis and poor by mouth intake over the last week at home. Hold her ARB. Cr with slight improvement this morning. Continue to monitor.  Chronic pain - Concern for overmedication, continue home medications at lower doses. Type 2 diabetes mellitus - Continue home Levemir, sliding scale insulin. Fasting CBG this morning 117 Acute encephalopathy - resolved  Obstructive sleep apnea - Continue nightly CPAP   DVT prophylaxis:  Lovenox Code Status: Partial, no mechanical ventilation Family Communication: no family bedside Disposition Plan: transfer to telemetry today, PT consult. D/c 2-3 days   Consultants:   Neurology   Procedures:   None   Antimicrobials:  Vancomycin 1/12 >>  Cefepime 1/12 >>   Subjective: - no chest pain, + shortness of breath, no abdominal pain, nausea or vomiting. Feels improved  Objective: Vitals:   02/29/16 2259 03/01/16 0309 03/01/16 0408 03/01/16 0741  BP: (!) 112/58 (!) 95/42 (!) 106/58 (!) 131/58  Pulse: 98 100 100 (!) 105  Resp: _0 Temp: 98.3 F (36.8 C) 98.8 F (37.1 C)  98.9 F (37.2 C)  TempSrc: Oral Oral  Oral  SpO2: 98% 98% 99% 100%  Weight:      Height:        Intake/Output Summary (Last 24 hours) at 03/01/16 1136 Last data filed at 03/01/16 0840  Gross per 24 hour  Intake             4627 ml  Output             4100 ml  Net              527 ml   Filed Weights   02/29/16 1100 02/29/16 1150  Weight: (!) 157.2 kg (346 lb 9 oz) (!) 157.2 kg (346 lb 9 oz)    Examination: Constitutional: NAD Vitals:   02/29/16 2259 03/01/16 0309 03/01/16 0408 03/01/16 0741  BP: (!) 112/58 (!) 95/42 (!) 106/58 (!) 131/58  Pulse: 98 100 100 (!) 105  Resp: _1 15  Temp: 98.3 F (36.8 C) 98.8 F (37.1 C)  98.9 F (37.2 C)  TempSrc: Oral Oral  Oral  SpO2: 98% 98% 99% 100%  Weight:      Height:       Eyes: PERRL, lids and conjunctivae normal ENMT: Mucous membranes are moist.  Respiratory: clear to auscultation bilaterally, no wheezing, no crackles.  Cardiovascular: Regular rate and rhythm, no murmurs / rubs / gallops. Abdomen: no tenderness. Bowel sounds positive.  Musculoskeletal: no clubbing / cyanosis Neurologic: non focal    Data Reviewed: I have personally reviewed following labs and imaging studies  CBC:  Recent Labs Lab 02/29/16 1127 02/29/16 1133 03/01/16 0253  WBC 18.4*  --  17.0*  NEUTROABS 15.9*  --   --   HGB 8.9* 9.2*  7.5*  HCT 28.1* 27.0* 24.1*  MCV 84.6  --  84.6  PLT 305  --  188   Basic Metabolic Panel:  Recent Labs Lab 02/29/16 1127 02/29/16 1133 03/01/16 0253  NA 132* 133* 136  K 4.2 4.1 4.2  CL 95* 96* 101  CO2 24  --  26  GLUCOSE 195* 193* 117*  BUN 21* 23* 22*  CREATININE 1.82* 1.80* 1.78*  CALCIUM 8.9  --  8.5*   GFR: Estimated Creatinine Clearance: 52.2 mL/min (by C-G formula based on SCr of 1.78 mg/dL (H)). Liver Function Tests:  Recent Labs Lab 02/29/16 1127 03/01/16 0253  AST 47* 23  ALT 27 22  ALKPHOS 67 63  BILITOT 0.3 0.6  PROT 6.0* 5.5*  ALBUMIN 3.2* 2.6*   No results for input(s): LIPASE, AMYLASE in the last 168 hours. No results for input(s): AMMONIA in the last 168 hours. Coagulation Profile:  Recent Labs Lab 02/29/16 1127  INR 1.04   Cardiac Enzymes:  Recent Labs Lab 02/29/16 1127  TROPONINI <0.03   BNP (last 3 results) No results for input(s): PROBNP in the last 8760 hours. HbA1C: No results for input(s): HGBA1C in the last 72 hours. CBG:  Recent Labs Lab 02/29/16 1144 02/29/16 2144 03/01/16 0738  GLUCAP 189* 130* 121*   Lipid Profile:  Recent Labs  02/29/16 1127  CHOL 111  HDL 55  LDLCALC 35  TRIG 104  CHOLHDL 2.0   Thyroid Function Tests: No results for input(s): TSH, T4TOTAL, FREET4, T3FREE, THYROIDAB in the last 72 hours. Anemia Panel: No results for input(s): VITAMINB12, FOLATE, FERRITIN, TIBC, IRON, RETICCTPCT in the last 72 hours. Urine analysis:    Component Value Date/Time   COLORURINE AMBER (A) 02/29/2016 1611   APPEARANCEUR HAZY (A) 02/29/2016 1611   APPEARANCEUR Clear 11/13/2013 1310   LABSPEC 1.019 02/29/2016 1611   LABSPEC 1.017 11/13/2013 1310   PHURINE 5.0 02/29/2016 1611   GLUCOSEU NEGATIVE 02/29/2016 1611   GLUCOSEU Negative 11/13/2013 1310   GLUCOSEU NEG mg/dL 03/02/2006 2050   HGBUR NEGATIVE 02/29/2016 1611   BILIRUBINUR SMALL (A) 02/29/2016 1611   BILIRUBINUR Negative 11/13/2013 1310    KETONESUR NEGATIVE 02/29/2016 1611   PROTEINUR NEGATIVE 02/29/2016 1611   UROBILINOGEN 0.2 11/12/2014 2057   NITRITE NEGATIVE 02/29/2016 1611   LEUKOCYTESUR NEGATIVE 02/29/2016 1611   LEUKOCYTESUR Trace 11/13/2013 1310   Sepsis Labs: Invalid input(s): PROCALCITONIN, LACTICIDVEN  Recent Results (from the past 240 hour(s))  Culture, blood (Routine x 2)     Status: None (Preliminary result)   Collection Time: 02/29/16  2:05 PM  Result Value Ref Range Status   Specimen Description BLOOD RIGHT ANTECUBITAL  Final   Special Requests BOTTLES DRAWN AEROBIC AND  ANAEROBIC 10CC  Final   Culture NO GROWTH < 24 HOURS  Final   Report Status PENDING  Incomplete  Culture, blood (Routine x 2)     Status: None (Preliminary result)   Collection Time: 02/29/16  2:10 PM  Result Value Ref Range Status   Specimen Description BLOOD RIGHT HAND  Final   Special Requests IN PEDIATRIC BOTTLE 3CC  Final   Culture NO GROWTH < 24 HOURS  Final   Report Status PENDING  Incomplete  MRSA PCR Screening     Status: None   Collection Time: 02/29/16  7:06 PM  Result Value Ref Range Status   MRSA by PCR NEGATIVE NEGATIVE Final    Comment:        The GeneXpert MRSA Assay (FDA approved for NASAL specimens only), is one component of a comprehensive MRSA colonization surveillance program. It is not intended to diagnose MRSA infection nor to guide or monitor treatment for MRSA infections.       Radiology Studies: Mr Brain Wo Contrast  Result Date: 02/29/2016 CLINICAL DATA:  61 y/o  F; slurred speech. EXAM: MRI HEAD WITHOUT CONTRAST TECHNIQUE: Multiplanar, multiecho pulse sequences of the brain and surrounding structures were obtained without intravenous contrast. COMPARISON:  02/19/2016 CT head. FINDINGS: Brain: Motion degraded T1 sequence. No diffusion signal abnormality. Motion degraded axial T2 weighted sequence. No significant T2 FLAIR signal abnormality of brain parenchyma. No focal mass effect, extra-axial  collection, or hydrocephalus. Motion degraded SWAN sequence, no gross susceptibility artifact to suggest intracranial hemorrhage. Vascular: Normal flow voids. Skull and upper cervical spine: Normal marrow signal. Sinuses/Orbits: Negative. Other: None. IMPRESSION: Motion degraded study. No evidence of acute infarct, hemorrhage, or mass effect. Electronically Signed   By: Kristine Garbe M.D.   On: 02/29/2016 22:27   Dg Chest Portable 1 View  Result Date: 02/29/2016 CLINICAL DATA:  Lethargy.  Hypoxia. EXAM: PORTABLE CHEST 1 VIEW COMPARISON:  02/13/2016 chest radiographs.  02/14/2016 chest CT. FINDINGS: The cardiomediastinal silhouette is unchanged. Surgical clips are again seen in the right lower neck related to prior thyroidectomy. There is improved aeration of the right lung base. Ill-defined opacity persists in the left perihilar region. Left basilar opacity has improved. No sizable pleural effusion or pneumothorax is identified. IMPRESSION: Improved aeration of the lung bases. Persistent left midlung opacity concerning for pneumonia. Electronically Signed   By: Logan Bores M.D.   On: 02/29/2016 12:48   Ct Head Code Stroke W/o Cm  Addendum Date: 02/29/2016   ADDENDUM REPORT: 02/29/2016 12:05 ADDENDUM: Case discussed with Dr Alver Fisher via phone at 12:03 p.m. Electronically Signed   By: Monte Fantasia M.D.   On: 02/29/2016 12:05   Result Date: 02/29/2016 CLINICAL DATA:  Code stroke.  Slurred speech. EXAM: CT HEAD WITHOUT CONTRAST TECHNIQUE: Contiguous axial images were obtained from the base of the skull through the vertex without intravenous contrast. COMPARISON:  11/12/2014 FINDINGS: Brain: No evidence of acute infarction, hemorrhage, hydrocephalus, extra-axial collection or mass lesion/mass effect. Vascular: No hyperdense vessel or unexpected calcification. Skull: 14 mm low left scalp lesion with ill-defined margins measuring up to 14 mm. There may be skin retraction, although there also diffuse  skin folds. Sinuses/Orbits: Chronic proptosis, known. Patient has history of thyroid disease in this could be related orbitopathy. Other: Page sent out at the time of interpretation on 02/29/2016 at 11:45 am to Dr. Alver Fisher. Text page with results sent out at 11:49 a.m. Awaiting callback verification. ASPECTS [facility] Stroke Program Early CT Score) - Ganglionic level  infarction (caudate, lentiform nuclei, internal capsule, insula, M1-M3 cortex): 7 - Supraganglionic infarction (M4-M6 cortex): 3 Total score (0-10 with 10 being normal): 10 IMPRESSION: 1. No acute finding. ASPECTS is 10. 2. 14 mm low left scalp lesion which is new from 2016 and has an infiltrative appearance. This could be postinflammatory or neoplastic and skin exam is recommended. Electronically Signed: By: Monte Fantasia M.D. On: 02/29/2016 11:50     Scheduled Meds: . aspirin EC  81 mg Oral Daily  . ceFEPime (MAXIPIME) IV  2 g Intravenous Q12H  . enoxaparin (LOVENOX) injection  75 mg Subcutaneous Q24H  . guaiFENesin  600 mg Oral BID  . insulin aspart  0-9 Units Subcutaneous TID WC  . insulin detemir  10 Units Subcutaneous QHS  . levothyroxine  150 mcg Oral QAC breakfast  . mouth rinse  15 mL Mouth Rinse BID  . sodium chloride flush  3 mL Intravenous Q12H  . vancomycin  1,750 mg Intravenous Q24H  . venlafaxine XR  150 mg Oral BID  . ziprasidone  20 mg Oral QPC supper   Continuous Infusions:   Marzetta Board, MD, PhD [facility] Pager [phone]  If 7PM-7AM, please contact night-coverage [email or web address] Password [facility] 03/01/2016, 11:36 AM

## 2016-03-01 NOTE — Progress Notes (Signed)
SLP Cancellation Note  Patient Details Name: [name] MRN: [id number] DOB: 1955-08-01   Cancelled treatment:       Reason Eval/Treat Not Completed: SLP screened, no needs identified, will sign off. Per RN passed stroke swallow screen and is tolerating diet. Imaging negative stroke findings. Please reconsult if further needs identified.  Deneise Lever, [place] CF-SLP Speech-Language Pathologist [phone]   Aliene Altes 03/01/2016, 9:52 AM

## 2016-03-02 ENCOUNTER — Inpatient Hospital Stay (HOSPITAL_COMMUNITY): Payer: Medicare Other

## 2016-03-02 LAB — BASIC METABOLIC PANEL
Anion gap: 9 (ref 5–15)
BUN: 16 mg/dL (ref 6–20)
CALCIUM: 9.6 mg/dL (ref 8.9–10.3)
CHLORIDE: 104 mmol/L (ref 101–111)
CO2: 25 mmol/L (ref 22–32)
CREATININE: 1.07 mg/dL — AB (ref 0.44–1.00)
GFR, EST NON AFRICAN AMERICAN: 56 mL/min — AB (ref >60)
Glucose, Bld: 134 mg/dL — ABNORMAL HIGH (ref 65–99)
Potassium: 4.2 mmol/L (ref 3.5–5.1)
SODIUM: 138 mmol/L (ref 135–145)

## 2016-03-02 LAB — GLUCOSE, CAPILLARY
GLUCOSE-CAPILLARY: 117 mg/dL — AB (ref 65–99)
GLUCOSE-CAPILLARY: 108 mg/dL — AB (ref 65–99)
Glucose-Capillary: 130 mg/dL — ABNORMAL HIGH (ref 65–99)
Glucose-Capillary: 93 mg/dL (ref 65–99)

## 2016-03-02 LAB — CBC
HCT: 27.3 % — ABNORMAL LOW (ref 36.0–46.0)
Hemoglobin: 8.7 g/dL — ABNORMAL LOW (ref 12.0–15.0)
MCH: 26.8 pg (ref 26.0–34.0)
MCHC: 31.9 g/dL (ref 30.0–36.0)
MCV: 84 fL (ref 78.0–100.0)
PLATELETS: 306 10*3/uL (ref 150–400)
RBC: 3.25 MIL/uL — AB (ref 3.87–5.11)
RDW: 17.7 % — AB (ref 11.5–15.5)
WBC: 11.2 10*3/uL — AB (ref 4.0–10.5)

## 2016-03-02 MED ORDER — VANCOMYCIN HCL 10 G IV SOLR
1250.0000 mg | Freq: Two times a day (BID) | INTRAVENOUS | Status: DC
  Administered 2016-03-02 – 2016-03-03 (×2): 1250 mg via INTRAVENOUS
  Filled 2016-03-02 (×3): qty 1250

## 2016-03-02 NOTE — Progress Notes (Signed)
Orthopedic Tech Progress Note Patient Details:  [name] 04-23-1955 [id number]  Ortho Devices Type of Ortho Device: Velcro wrist forearm splint Ortho Device/Splint Interventions: Application   [place] Pink 03/02/2016, 6:18 PM

## 2016-03-02 NOTE — Progress Notes (Signed)
Pharmacy Antibiotic Note  [name] is a 61 y.o. female admitted on 02/29/2016 with hypotension, AMS, and slurred speech. Also with recent hospitalization for CAP in early January. Pharmacy consulted for cefepime and vancomycin dosing for PNA. WBC improved and continues to be afebrile. Renal function also improving, and abx doses adjusted accordingly.   Plan: -Change vancomycin to 1250 mg q12h -Cefepime 2 g IV q12h  -Monitor renal function and cultures -Vancomycin trough at steady state  Height: _0  (165.1 cm) Weight: (!) 339 lb 6.4 oz (154 kg) IBW/kg (Calculated) : 57  Temp (24hrs), Avg:98.8 F (37.1 C), Min:98.1 F (36.7 C), Max:99.9 F (37.7 C)   Recent Labs Lab 02/29/16 1127 02/29/16 1133 02/29/16 1418 03/01/16 0253 03/02/16 1007  WBC 18.4*  --   --  17.0* 11.2*  CREATININE 1.82* 1.80*  --  1.78* 1.07*  LATICACIDVEN  --   --  1.80  --   --     Estimated Creatinine Clearance: 85.6 mL/min (by C-G formula based on SCr of 1.07 mg/dL (H)).    Allergies  Allergen Reactions  . Bee Venom Anaphylaxis  . Shellfish Allergy Anaphylaxis  . Buprenorphine Hcl Other (See Comments)    Overly sedated  . Morphine And Related Other (See Comments)    Overly sedated   Antimicrobials this admission: 1/12 vancomycin > 1/12 cefepime >  Dose adjustments this admission: 1/14: Vanc 1750 q24 > 1250 q12 for improved renal function   Microbiology results: 1/12 blood cx: ngtd  1/12 MRSA - neg  1/12 Flu - neg   Argie Ramming, PharmD Pharmacy Resident  Pager [phone] 03/02/16 1:55 PM

## 2016-03-02 NOTE — Progress Notes (Signed)
PROGRESS NOTE  [name] NWG:[id number] DOB: 04-Jun-1955 DOA: 02/29/2016 PCP: [name], MD   LOS: 2 days   Brief Narrative: 61 y.o. female with medical history significant of chronic kidney disease stage III, hypertension, hyperlipidemia, obesity, obstructive sleep apnea, she was recently hospitalized here for community-acquired pneumonia and discharged last week, was seen 1/12 in orthopedic surgery office and she was found to be hypotensive, have decreased mentation as well as slurred speech.  Assessment & Plan: Active Problems:   Hypothyroidism   HLD (hyperlipidemia)   Dysarthria   Chronic back pain   Hypotension   Altered mental status   CKD (chronic kidney disease) stage 3, GFR 30-59 ml/min   Diabetes mellitus, type II, insulin dependent (HCC)   Sepsis ([place])   AKI (acute kidney injury) ([place])   Acute kidney injury ([place])   HCAP (healthcare-associated pneumonia)   Sepsis due to HCAP - Patient with elevated white count 18, chest x-ray and symptoms consistent with ongoing pneumonia, she was hypotensive in the ED and admitted to SDU. Blood pressure improved, transferred to telemetry 1/13. Good po intake, discontinued fluids.  Acute hypoxic respiratory failure - Oxygen as needed, her PO2 was low on ABG. Wean off oxygen as tolerated, on room air this morning.  Slurred speech - Likely in the setting of hypotension, MRI of the brain negative for stroke Back pain - MRI L spine with lumbar spondylosis and degenerative disc disease . Acute on chronic Acute kidney injury and chronic kidney disease stage III - Likely in the setting of sepsis and poor by mouth intake over the last week at home. Hold her ARB. Cr improved nicely this morning. Chronic pain - Concern for overmedication, continue home medications at lower doses. Type 2 diabetes mellitus - Continue home Levemir, sliding scale insulin. Fasting CBG this morning 117 again Acute encephalopathy - resolved  Obstructive sleep  apnea - Continue nightly CPAP Wrist pain - complains of significant right wrist, hand and forearm pain following a fall last November. Pain not improving. Just seen by Dr. Sharol Given when she was hypotensive and brought to the ER. Will XR today.   DVT prophylaxis: Lovenox Code Status: Partial, no mechanical ventilation Family Communication: no family bedside Disposition Plan: D/c possible Monday   Consultants:   Neurology   Procedures:   None   Antimicrobials:  Vancomycin 1/12 >>  Cefepime 1/12 >>   Subjective: - no chest pain, shortness of breath resolved, no abdominal pain, nausea or vomiting. Complains of right wrist pain  Objective: Vitals:   03/01/16 2135 03/01/16 2352 03/02/16 0510 03/02/16 0927  BP: 114/62  139/67 139/63  Pulse: 73  (!) 102 97  Resp: _0 Temp: 98.7 F (37.1 C)  98.1 F (36.7 C) 98.5 F (36.9 C)  TempSrc: Oral  Oral Oral  SpO2: 96%  96% 100%  Weight:  (!) 154 kg (339 lb 6.4 oz)    Height:        Intake/Output Summary (Last 24 hours) at 03/02/16 1147 Last data filed at 03/02/16 0928  Gross per 24 hour  Intake             1320 ml  Output             1950 ml  Net             -630 ml   Filed Weights   02/29/16 1100 02/29/16 1150 03/01/16 2352  Weight: (!) 157.2 kg (346 lb 9 oz) [name])  157.2 kg (346 lb 9 oz) (!) 154 kg (339 lb 6.4 oz)    Examination: Constitutional: NAD Vitals:   03/01/16 2135 03/01/16 2352 03/02/16 0510 03/02/16 0927  BP: 114/62  139/67 139/63  Pulse: 73  (!) 102 97  Resp: _0 Temp: 98.7 F (37.1 C)  98.1 F (36.7 C) 98.5 F (36.9 C)  TempSrc: Oral  Oral Oral  SpO2: 96%  96% 100%  Weight:  (!) 154 kg (339 lb 6.4 oz)    Height:       Eyes: PERRL, lids and conjunctivae normal ENMT: Mucous membranes are moist.  Respiratory: clear to auscultation bilaterally, no wheezing, no crackles.  Cardiovascular: Regular rate and rhythm, no murmurs / rubs / gallops. Abdomen: no tenderness. Bowel sounds positive.    Musculoskeletal: no clubbing / cyanosis Neurologic: non focal    Data Reviewed: I have personally reviewed following labs and imaging studies  CBC:  Recent Labs Lab 02/29/16 1127 02/29/16 1133 03/01/16 0253 03/02/16 1007  WBC 18.4*  --  17.0* 11.2*  NEUTROABS 15.9*  --   --   --   HGB 8.9* 9.2* 7.5* 8.7*  HCT 28.1* 27.0* 24.1* 27.3*  MCV 84.6  --  84.6 84.0  PLT 305  --  303 612   Basic Metabolic Panel:  Recent Labs Lab 02/29/16 1127 02/29/16 1133 03/01/16 0253 03/02/16 1007  NA 132* 133* 136 138  K 4.2 4.1 4.2 4.2  CL 95* 96* 101 104  CO2 24  --  26 25  GLUCOSE 195* 193* 117* 134*  BUN 21* 23* 22* 16  CREATININE 1.82* 1.80* 1.78* 1.07*  CALCIUM 8.9  --  8.5* 9.6   GFR: Estimated Creatinine Clearance: 85.6 mL/min (by C-G formula based on SCr of 1.07 mg/dL (H)). Liver Function Tests:  Recent Labs Lab 02/29/16 1127 03/01/16 0253  AST 47* 23  ALT 27 22  ALKPHOS 67 63  BILITOT 0.3 0.6  PROT 6.0* 5.5*  ALBUMIN 3.2* 2.6*   No results for input(s): LIPASE, AMYLASE in the last 168 hours. No results for input(s): AMMONIA in the last 168 hours. Coagulation Profile:  Recent Labs Lab 02/29/16 1127  INR 1.04   Cardiac Enzymes:  Recent Labs Lab 02/29/16 1127  TROPONINI <0.03   BNP (last 3 results) No results for input(s): PROBNP in the last 8760 hours. HbA1C: No results for input(s): HGBA1C in the last 72 hours. CBG:  Recent Labs Lab 03/01/16 0738 03/01/16 1234 03/01/16 1709 03/01/16 2111 03/02/16 0738  GLUCAP 121* 182* 142* 137* 117*   Lipid Profile:  Recent Labs  02/29/16 1127  CHOL 111  HDL 55  LDLCALC 35  TRIG 104  CHOLHDL 2.0   Thyroid Function Tests: No results for input(s): TSH, T4TOTAL, FREET4, T3FREE, THYROIDAB in the last 72 hours. Anemia Panel: No results for input(s): VITAMINB12, FOLATE, FERRITIN, TIBC, IRON, RETICCTPCT in the last 72 hours. Urine analysis:    Component Value Date/Time   COLORURINE AMBER (A)  02/29/2016 1611   APPEARANCEUR HAZY (A) 02/29/2016 1611   APPEARANCEUR Clear 11/13/2013 1310   LABSPEC 1.019 02/29/2016 1611   LABSPEC 1.017 11/13/2013 1310   PHURINE 5.0 02/29/2016 1611   GLUCOSEU NEGATIVE 02/29/2016 1611   GLUCOSEU Negative 11/13/2013 1310   GLUCOSEU NEG mg/dL 03/02/2006 2050   HGBUR NEGATIVE 02/29/2016 1611   BILIRUBINUR SMALL (A) 02/29/2016 1611   BILIRUBINUR Negative 11/13/2013 [place] 02/29/2016 [place] 02/29/2016 1611  UROBILINOGEN 0.2 11/12/2014 2057   NITRITE NEGATIVE 02/29/2016 1611   LEUKOCYTESUR NEGATIVE 02/29/2016 1611   LEUKOCYTESUR Trace 11/13/2013 1310   Sepsis Labs: Invalid input(s): PROCALCITONIN, LACTICIDVEN  Recent Results (from the past 240 hour(s))  Culture, blood (Routine x 2)     Status: None (Preliminary result)   Collection Time: 02/29/16  2:05 PM  Result Value Ref Range Status   Specimen Description BLOOD RIGHT ANTECUBITAL  Final   Special Requests BOTTLES DRAWN AEROBIC AND ANAEROBIC 10CC  Final   Culture NO GROWTH 2 DAYS  Final   Report Status PENDING  Incomplete  Culture, blood (Routine x 2)     Status: None (Preliminary result)   Collection Time: 02/29/16  2:10 PM  Result Value Ref Range Status   Specimen Description BLOOD RIGHT HAND  Final   Special Requests IN PEDIATRIC BOTTLE 3CC  Final   Culture NO GROWTH 2 DAYS  Final   Report Status PENDING  Incomplete  MRSA PCR Screening     Status: None   Collection Time: 02/29/16  7:06 PM  Result Value Ref Range Status   MRSA by PCR NEGATIVE NEGATIVE Final    Comment:        The GeneXpert MRSA Assay (FDA approved for NASAL specimens only), is one component of a comprehensive MRSA colonization surveillance program. It is not intended to diagnose MRSA infection nor to guide or monitor treatment for MRSA infections.       Radiology Studies: Mr Brain Wo Contrast  Result Date: 02/29/2016 CLINICAL DATA:  61 y/o  F; slurred speech. EXAM: MRI  HEAD WITHOUT CONTRAST TECHNIQUE: Multiplanar, multiecho pulse sequences of the brain and surrounding structures were obtained without intravenous contrast. COMPARISON:  02/19/2016 CT head. FINDINGS: Brain: Motion degraded T1 sequence. No diffusion signal abnormality. Motion degraded axial T2 weighted sequence. No significant T2 FLAIR signal abnormality of brain parenchyma. No focal mass effect, extra-axial collection, or hydrocephalus. Motion degraded SWAN sequence, no gross susceptibility artifact to suggest intracranial hemorrhage. Vascular: Normal flow voids. Skull and upper cervical spine: Normal marrow signal. Sinuses/Orbits: Negative. Other: None. IMPRESSION: Motion degraded study. No evidence of acute infarct, hemorrhage, or mass effect. Electronically Signed   By: Kristine Garbe M.D.   On: 02/29/2016 22:27   Mr Lumbar Spine W Wo Contrast  Result Date: 03/01/2016 CLINICAL DATA:  Right leg weakness.  Low back pain. EXAM: MRI LUMBAR SPINE WITHOUT AND WITH CONTRAST TECHNIQUE: Multiplanar and multiecho pulse sequences of the lumbar spine were obtained without and with intravenous contrast. CONTRAST:  68m MULTIHANCE GADOBENATE DIMEGLUMINE 529 MG/ML IV SOLN COMPARISON:  Multiple exams, including 08/14/2014 FINDINGS: Body habitus reduces diagnostic sensitivity and specificity. Despite efforts by the technologist and patient, motion artifact is present on today's exam and could not be eliminated. This reduces exam sensitivity and specificity. Segmentation: The lowest lumbar type non-rib-bearing vertebra is labeled as L5. Alignment: 3 mm of grade 1 anterolisthesis at L4-5 attributed to degenerative facet arthropathy. Vertebrae: Mild disc desiccation at L4-5 and L5-S1. No significant abnormal vertebral enhancement. No compelling findings of arachnoiditis or abnormal epidural fluid collection. Conus medullaris: Extends to the L1 level and appears normal. Paraspinal and other soft tissues: Unremarkable  Disc levels: L1-2:  Unremarkable. L2-3: Mild right subarticular lateral recess stenosis and borderline right foraminal stenosis due to right lateral recess and foraminal disc protrusion, similar to prior. L3-4: Moderate to prominent central narrowing of the thecal sac with moderate bilateral subarticular lateral recess stenosis and moderate bilateral foraminal stenosis due to  right greater than left facet arthropathy and diffuse disc bulge. The left foraminal stenosis is mildly worsened and prior. Right greater than left facet joint effusions. L4-5: Moderate to prominent central narrowing of the thecal sac with mild to moderate bilateral foraminal stenosis and moderate bilateral subarticular lateral recess stenosis due to disc bulge and facet arthropathy, degree of impingement mildly worsened from prior. Right greater than left facet joint effusions. L5-S1: Moderate left and mild right subarticular lateral recess stenosis along with mild left and borderline right foraminal stenosis due to left paracentral disc protrusion extending caudad, the protrusion is shown on image 9/8. Mildly worsened from prior. IMPRESSION: 1. Mildly progressive lumbar spondylosis and degenerative disc disease compared to prior, causing a moderate to prominent impingement at L3-4 and L4-5 ; moderate impingement at L5-S1; mild impingement at L2- 3, as detailed above. 2. Reduced sensitivity and specificity due to motion artifact and body habitus. Electronically Signed   By: Van Clines M.D.   On: 03/01/2016 11:55   Dg Chest Portable 1 View  Result Date: 02/29/2016 CLINICAL DATA:  Lethargy.  Hypoxia. EXAM: PORTABLE CHEST 1 VIEW COMPARISON:  02/13/2016 chest radiographs.  02/14/2016 chest CT. FINDINGS: The cardiomediastinal silhouette is unchanged. Surgical clips are again seen in the right lower neck related to prior thyroidectomy. There is improved aeration of the right lung base. Ill-defined opacity persists in the left perihilar  region. Left basilar opacity has improved. No sizable pleural effusion or pneumothorax is identified. IMPRESSION: Improved aeration of the lung bases. Persistent left midlung opacity concerning for pneumonia. Electronically Signed   By: Logan Bores M.D.   On: 02/29/2016 12:48   Scheduled Meds: . aspirin EC  81 mg Oral Daily  . ceFEPime (MAXIPIME) IV  2 g Intravenous Q12H  . enoxaparin (LOVENOX) injection  75 mg Subcutaneous Q24H  . guaiFENesin  600 mg Oral BID  . insulin aspart  0-9 Units Subcutaneous TID WC  . insulin detemir  10 Units Subcutaneous QHS  . levothyroxine  150 mcg Oral QAC breakfast  . mouth rinse  15 mL Mouth Rinse BID  . sodium chloride flush  3 mL Intravenous Q12H  . vancomycin  1,750 mg Intravenous Q24H  . venlafaxine XR  150 mg Oral BID  . ziprasidone  20 mg Oral QPC supper   Continuous Infusions:  Marzetta Board, MD, PhD [facility] Pager [phone]  If 7PM-7AM, please contact night-coverage [email or web address] Password [facility] 03/02/2016, 11:47 AM

## 2016-03-02 NOTE — Progress Notes (Signed)
MRI lumbar spine was reviewed and discussed with Dr. Annette Stable. He will obtain some more x-rays to determine if patient is a candidate for any intervention. No further recommendations from neurology at this time. Please page with questions.

## 2016-03-02 NOTE — Consult Note (Signed)
Reason for Consult: Back pain. Referring Physician: Medicine  [name] is an 61 y.o. female.  HPI: 61 year old female with multiple medical issues recently admitted for failure to thrive and increasing confusion. During hospitalization patient complains of persistent back pain. This is been a chronic problem for her. She has some intermittent radiation into her lower extremities. This is not significantly changed over time. She has no new symptoms of numbness or weakness. She's having no bowel or bladder dysfunction.  Past Medical History:  Diagnosis Date  . Anemia   . Anxiety   . Arthritis    "pretty much all my joints"  . Asthma   . Benign paroxysmal positional vertigo 12/30/2012  . Breast cancer ([place]) 02/13/12   ruq  100'clock bx Ductal Carcinoma in Situ,(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  . Breast mass in female    L breast 2008, US showed likely fat necrosis  . Chronic lower back pain   . CKD (chronic kidney disease), stage III    "lower stage" (01/06/2014)  . Daily headache   . Depression   . GERD (gastroesophageal reflux disease)   . History of blood transfusion 2011   "after one of my OR's"  . History of hiatal hernia   . History of stomach ulcers   . HSV (herpes simplex virus) infection   . Hx of radiation therapy 05/05/12- 07/15/12   right breast, 45 gray x 25 fx, lumpectomy cavity boosted to 16.2 gray  . Hyperlipemia   . Hypertension    sees Dr. Criss Rosales , [facility] [place]  . Hypothyroidism   . Kidney stones   . Knee pain, bilateral   . Migraines    "~ 3 times/month" (01/06/2014)  . Obesity   . OSA on CPAP    pt does not know settings  . Pancreatitis    hx of  . Pneumonia 1950's  . Polyneuropathy in diabetes(357.2)   . Schizophrenia ([place])   . 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 Tachycardia    with sx in 2008  . Thyroid disease   . Type II diabetes mellitus ([place])     Past Surgical History:  Procedure Laterality Date  . ABDOMINAL HYSTERECTOMY  1979?    partial  . BREAST LUMPECTOMY  03/03/2012   Procedure: LUMPECTOMY;  Surgeon: Haywood Lasso, MD;  Location: [facility];  Service: General;  Laterality: Right;  . BREAST SURGERY Right 01/2012   "cancer"  . CHOLECYSTECTOMY  1980's?  . COLONOSCOPY WITH PROPOFOL N/A 09/28/2012   Procedure: COLONOSCOPY WITH PROPOFOL;  Surgeon: Lear Ng, MD;  Location: [facility];  Service: Endoscopy;  Laterality: N/A;  . FOOT FRACTURE SURGERY Right 1990's  . JOINT REPLACEMENT    . REVISION TOTAL KNEE ARTHROPLASTY  07/2009  . SHOULDER OPEN ROTATOR CUFF REPAIR Left 1990's  . THYROIDECTOMY  1970's  . TOTAL KNEE ARTHROPLASTY Bilateral 2009-06/2009   left; right    Family History  Problem Relation Age of Onset  . Heart disease Brother     Multiple MIs, starting in his 2s  . Diabetes Brother   . Hypertension Mother   . Diabetes Mother   . Breast cancer Mother 64  . Bone cancer Mother   . Hypertension Sister   . Diabetes Sister   . Breast cancer Sister 11  . Breast cancer Maternal Grandmother   . Heart disease Maternal Grandmother   . Uterine cancer Other 19  . Breast cancer Paternal Aunt 34  . Breast cancer Paternal Grandmother  dx in her 33s  . Prostate cancer Paternal Grandfather     Social History:  reports that she quit smoking about 23 years ago. Her smoking use included Cigarettes. She has a 7.00 pack-year smoking history. She has never used smokeless tobacco. She reports that she does not drink alcohol or use drugs.  Allergies:  Allergies  Allergen Reactions  . Bee Venom Anaphylaxis  . Shellfish Allergy Anaphylaxis  . Buprenorphine Hcl Other (See Comments)    Overly sedated  . Morphine And Related Other (See Comments)    Overly sedated    Medications: I have reviewed the patient's current medications.  Results for orders placed or performed during the hospital encounter of 02/29/16 (from the past 48 hour(s))  Culture, blood (Routine x 2)     Status: None (Preliminary  result)   Collection Time: 02/29/16  2:05 PM  Result Value Ref Range   Specimen Description BLOOD RIGHT ANTECUBITAL    Special Requests BOTTLES DRAWN AEROBIC AND ANAEROBIC 10CC    Culture NO GROWTH 2 DAYS    Report Status PENDING   Culture, blood (Routine x 2)     Status: None (Preliminary result)   Collection Time: 02/29/16  2:10 PM  Result Value Ref Range   Specimen Description BLOOD RIGHT HAND    Special Requests IN PEDIATRIC BOTTLE 3CC    Culture NO GROWTH 2 DAYS    Report Status PENDING   I-Stat CG4 Lactic Acid, ED     Status: None   Collection Time: 02/29/16  2:18 PM  Result Value Ref Range   Lactic Acid, Venous 1.80 0.5 - 1.9 mmol/L  Urinalysis, Routine w reflex microscopic     Status: Abnormal   Collection Time: 02/29/16  4:11 PM  Result Value Ref Range   Color, Urine AMBER (A) YELLOW    Comment: BIOCHEMICALS MAY BE AFFECTED BY COLOR   APPearance HAZY (A) CLEAR   Specific Gravity, Urine 1.019 1.005 - 1.030   pH 5.0 5.0 - 8.0   Glucose, UA NEGATIVE NEGATIVE mg/dL   Hgb urine dipstick NEGATIVE NEGATIVE   Bilirubin Urine SMALL (A) NEGATIVE   Ketones, ur NEGATIVE NEGATIVE mg/dL   Protein, ur NEGATIVE NEGATIVE mg/dL   Nitrite NEGATIVE NEGATIVE   Leukocytes, UA NEGATIVE NEGATIVE  Urine rapid drug screen (hosp performed)     Status: Abnormal   Collection Time: 02/29/16  4:12 PM  Result Value Ref Range   Opiates POSITIVE (A) NONE DETECTED   Cocaine NONE DETECTED NONE DETECTED   Benzodiazepines POSITIVE (A) NONE DETECTED   Amphetamines NONE DETECTED NONE DETECTED   Tetrahydrocannabinol NONE DETECTED NONE DETECTED   Barbiturates NONE DETECTED NONE DETECTED    Comment:        DRUG SCREEN FOR MEDICAL PURPOSES ONLY.  IF CONFIRMATION IS NEEDED FOR ANY PURPOSE, NOTIFY LAB WITHIN 5 DAYS.        LOWEST DETECTABLE LIMITS FOR URINE DRUG SCREEN Drug Class       Cutoff (ng/mL) Amphetamine      1000 Barbiturate      200 Benzodiazepine   914 Tricyclics       782 Opiates           300 Cocaine          300 THC              50   Influenza panel by PCR (type A & B, H1N1)     Status: None   Collection Time: 02/29/16  5:07  PM  Result Value Ref Range   Influenza A By PCR NEGATIVE NEGATIVE   Influenza B By PCR NEGATIVE NEGATIVE    Comment: (NOTE) The Xpert Xpress Flu assay is intended as an aid in the diagnosis of  influenza and should not be used as a sole basis for treatment.  This  assay is FDA approved for nasopharyngeal swab specimens only. Nasal  washings and aspirates are unacceptable for Xpert Xpress Flu testing.   MRSA PCR Screening     Status: None   Collection Time: 02/29/16  7:06 PM  Result Value Ref Range   MRSA by PCR NEGATIVE NEGATIVE    Comment:        The GeneXpert MRSA Assay (FDA approved for NASAL specimens only), is one component of a comprehensive MRSA colonization surveillance program. It is not intended to diagnose MRSA infection nor to guide or monitor treatment for MRSA infections.   Glucose, capillary     Status: Abnormal   Collection Time: 02/29/16  9:44 PM  Result Value Ref Range   Glucose-Capillary 130 (H) 65 - 99 mg/dL   Comment 1 Notify RN   Comprehensive metabolic panel     Status: Abnormal   Collection Time: 03/01/16  2:53 AM  Result Value Ref Range   Sodium 136 135 - 145 mmol/L   Potassium 4.2 3.5 - 5.1 mmol/L   Chloride 101 101 - 111 mmol/L   CO2 26 22 - 32 mmol/L   Glucose, Bld 117 (H) 65 - 99 mg/dL   BUN 22 (H) 6 - 20 mg/dL   Creatinine, Ser 1.78 (H) 0.44 - 1.00 mg/dL   Calcium 8.5 (L) 8.9 - 10.3 mg/dL   Total Protein 5.5 (L) 6.5 - 8.1 g/dL   Albumin 2.6 (L) 3.5 - 5.0 g/dL   AST 23 15 - 41 U/L   ALT 22 14 - 54 U/L   Alkaline Phosphatase 63 38 - 126 U/L   Total Bilirubin 0.6 0.3 - 1.2 mg/dL   GFR calc non Af Amer 30 (L) >60 mL/min   GFR calc Af Amer 35 (L) >60 mL/min    Comment: (NOTE) The eGFR has been calculated using the CKD EPI equation. This calculation has not been validated in all clinical  situations. eGFR's persistently <60 mL/min signify possible Chronic Kidney Disease.    Anion gap 9 5 - 15  CBC     Status: Abnormal   Collection Time: 03/01/16  2:53 AM  Result Value Ref Range   WBC 17.0 (H) 4.0 - 10.5 K/uL   RBC 2.85 (L) 3.87 - 5.11 MIL/uL   Hemoglobin 7.5 (L) 12.0 - 15.0 g/dL   HCT 24.1 (L) 36.0 - 46.0 %   MCV 84.6 78.0 - 100.0 fL   MCH 26.3 26.0 - 34.0 pg   MCHC 31.1 30.0 - 36.0 g/dL   RDW 17.6 (H) 11.5 - 15.5 %   Platelets 303 150 - 400 K/uL  Glucose, capillary     Status: Abnormal   Collection Time: 03/01/16  7:38 AM  Result Value Ref Range   Glucose-Capillary 121 (H) 65 - 99 mg/dL   Comment 1 Notify RN    Comment 2 Document in Chart   Glucose, capillary     Status: Abnormal   Collection Time: 03/01/16 12:34 PM  Result Value Ref Range   Glucose-Capillary 182 (H) 65 - 99 mg/dL   Comment 1 Notify RN    Comment 2 Document in Chart   Glucose, capillary  Status: Abnormal   Collection Time: 03/01/16  5:09 PM  Result Value Ref Range   Glucose-Capillary 142 (H) 65 - 99 mg/dL  Glucose, capillary     Status: Abnormal   Collection Time: 03/01/16  9:11 PM  Result Value Ref Range   Glucose-Capillary 137 (H) 65 - 99 mg/dL  Glucose, capillary     Status: Abnormal   Collection Time: 03/02/16  7:38 AM  Result Value Ref Range   Glucose-Capillary 117 (H) 65 - 99 mg/dL  Basic metabolic panel     Status: Abnormal   Collection Time: 03/02/16 10:07 AM  Result Value Ref Range   Sodium 138 135 - 145 mmol/L   Potassium 4.2 3.5 - 5.1 mmol/L   Chloride 104 101 - 111 mmol/L   CO2 25 22 - 32 mmol/L   Glucose, Bld 134 (H) 65 - 99 mg/dL   BUN 16 6 - 20 mg/dL   Creatinine, Ser 1.07 (H) 0.44 - 1.00 mg/dL   Calcium 9.6 8.9 - 10.3 mg/dL   GFR calc non Af Amer 56 (L) >60 mL/min   GFR calc Af Amer >60 >60 mL/min    Comment: (NOTE) The eGFR has been calculated using the CKD EPI equation. This calculation has not been validated in all clinical situations. eGFR's persistently  <60 mL/min signify possible Chronic Kidney Disease.    Anion gap 9 5 - 15  CBC     Status: Abnormal   Collection Time: 03/02/16 10:07 AM  Result Value Ref Range   WBC 11.2 (H) 4.0 - 10.5 K/uL   RBC 3.25 (L) 3.87 - 5.11 MIL/uL   Hemoglobin 8.7 (L) 12.0 - 15.0 g/dL   HCT 27.3 (L) 36.0 - 46.0 %   MCV 84.0 78.0 - 100.0 fL   MCH 26.8 26.0 - 34.0 pg   MCHC 31.9 30.0 - 36.0 g/dL   RDW 17.7 (H) 11.5 - 15.5 %   Platelets 306 150 - 400 K/uL  Glucose, capillary     Status: Abnormal   Collection Time: 03/02/16 12:20 PM  Result Value Ref Range   Glucose-Capillary 108 (H) 65 - 99 mg/dL    Mr Brain Wo Contrast  Result Date: 02/29/2016 CLINICAL DATA:  61 y/o  F; slurred speech. EXAM: MRI HEAD WITHOUT CONTRAST TECHNIQUE: Multiplanar, multiecho pulse sequences of the brain and surrounding structures were obtained without intravenous contrast. COMPARISON:  02/19/2016 CT head. FINDINGS: Brain: Motion degraded T1 sequence. No diffusion signal abnormality. Motion degraded axial T2 weighted sequence. No significant T2 FLAIR signal abnormality of brain parenchyma. No focal mass effect, extra-axial collection, or hydrocephalus. Motion degraded SWAN sequence, no gross susceptibility artifact to suggest intracranial hemorrhage. Vascular: Normal flow voids. Skull and upper cervical spine: Normal marrow signal. Sinuses/Orbits: Negative. Other: None. IMPRESSION: Motion degraded study. No evidence of acute infarct, hemorrhage, or mass effect. Electronically Signed   By: Kristine Garbe M.D.   On: 02/29/2016 22:27   Mr Lumbar Spine W Wo Contrast  Result Date: 03/01/2016 CLINICAL DATA:  Right leg weakness.  Low back pain. EXAM: MRI LUMBAR SPINE WITHOUT AND WITH CONTRAST TECHNIQUE: Multiplanar and multiecho pulse sequences of the lumbar spine were obtained without and with intravenous contrast. CONTRAST:  19m MULTIHANCE GADOBENATE DIMEGLUMINE 529 MG/ML IV SOLN COMPARISON:  Multiple exams, including 08/14/2014  FINDINGS: Body habitus reduces diagnostic sensitivity and specificity. Despite efforts by the technologist and patient, motion artifact is present on today's exam and could not be eliminated. This reduces exam sensitivity and specificity. Segmentation: The  lowest lumbar type non-rib-bearing vertebra is labeled as L5. Alignment: 3 mm of grade 1 anterolisthesis at L4-5 attributed to degenerative facet arthropathy. Vertebrae: Mild disc desiccation at L4-5 and L5-S1. No significant abnormal vertebral enhancement. No compelling findings of arachnoiditis or abnormal epidural fluid collection. Conus medullaris: Extends to the L1 level and appears normal. Paraspinal and other soft tissues: Unremarkable Disc levels: L1-2:  Unremarkable. L2-3: Mild right subarticular lateral recess stenosis and borderline right foraminal stenosis due to right lateral recess and foraminal disc protrusion, similar to prior. L3-4: Moderate to prominent central narrowing of the thecal sac with moderate bilateral subarticular lateral recess stenosis and moderate bilateral foraminal stenosis due to right greater than left facet arthropathy and diffuse disc bulge. The left foraminal stenosis is mildly worsened and prior. Right greater than left facet joint effusions. L4-5: Moderate to prominent central narrowing of the thecal sac with mild to moderate bilateral foraminal stenosis and moderate bilateral subarticular lateral recess stenosis due to disc bulge and facet arthropathy, degree of impingement mildly worsened from prior. Right greater than left facet joint effusions. L5-S1: Moderate left and mild right subarticular lateral recess stenosis along with mild left and borderline right foraminal stenosis due to left paracentral disc protrusion extending caudad, the protrusion is shown on image 9/8. Mildly worsened from prior. IMPRESSION: 1. Mildly progressive lumbar spondylosis and degenerative disc disease compared to prior, causing a moderate to  prominent impingement at L3-4 and L4-5 ; moderate impingement at L5-S1; mild impingement at L2- 3, as detailed above. 2. Reduced sensitivity and specificity due to motion artifact and body habitus. Electronically Signed   By: Van Clines M.D.   On: 03/01/2016 11:55    Pertinent items noted in HPI and remainder of comprehensive ROS otherwise negative. Blood pressure 139/63, pulse 97, temperature 98.5 F (36.9 C), temperature source Oral, resp. rate 18, height _0  (1.651 m), weight (!) 154 kg (339 lb 6.4 oz), SpO2 100 %. Patient is awake and alert. She is oriented and appropriate. She does not appear toxic nor does she appear to be uncomfortable at present. Examination her head ears eyes and throat is unremarkable. Chest and abdomen are normal. Neurologically she is awake and alert. She just oriented and appropriate. Her cranial nerve function is intact. Motor examination reveals good motor strength in both upper and lower extremities. She has no pain with straight leg raising. She has no significant sensory loss. Reflexes are hypoactive but symmetric.  Assessment/Plan: Patient certainly has evidence of multilevel spondylosis and stenosis. She has a large disc herniation L5-S1 bias toward the left. Certainly any these issues could be causing her significant back pain. Given her overall health and medical condition I would not consider any type of surgical intervention. I think the patient is suited for possible injection management. She is previously been seeing a local orthopedics physician for her back pain. She may certainly continue to do this.  Orrie Lascano A 03/02/2016, 2:00 PM

## 2016-03-03 ENCOUNTER — Encounter (HOSPITAL_COMMUNITY): Payer: Self-pay

## 2016-03-03 LAB — GLUCOSE, CAPILLARY
Glucose-Capillary: 101 mg/dL — ABNORMAL HIGH (ref 65–99)
Glucose-Capillary: 106 mg/dL — ABNORMAL HIGH (ref 65–99)

## 2016-03-03 MED ORDER — LEVOFLOXACIN 750 MG PO TABS: 750.0000 mg | ORAL_TABLET | Freq: Every day | ORAL | 0 refills | Status: DC

## 2016-03-03 NOTE — Discharge Summary (Signed)
Physician Discharge Summary  [name] UEA:[id number] DOB: June 04, 1955 DOA: 02/29/2016  PCP: [name], MD  Admit date: 02/29/2016 Discharge date: 03/03/2016  Admitted From: home  Disposition:  home   Recommendations for Outpatient Follow-up:  1. Follow up with PCP in 1-2 weeks 2. Levaquin for 5 days  Home Health: PT, RN, SW  Discharge Condition: stable CODE STATUS: Partial (no intubation) Diet recommendation: heart healthy  HPI: [name] is a 61 y.o. female with medical history significant of chronic kidney disease stage III, hypertension, hyperlipidemia, obesity, obstructive sleep apnea, she was recently hospitalized here for community-acquired pneumonia and discharged last week, was seen today in orthopedic surgery office and she was found to be hypotensive, have decreased mentation as well as slurred speech. She was brought into the hospital as a code stroke. She was evaluated by neurology, who felt that stroke is unlikely. Patient tells me that she's been having a productive cough ever since she left the hospital, she feels however it's improving. She denies any fever or chills at home. She complains of ongoing shortness of breath over the last week. She has no chest pain, she has no palpitations. She has no abdominal pain, nausea, vomiting or diarrhea. She complains of a flulike illness and diffuse muscle aches, but that's been going on for the last week. In the ED, she was found to be hypotensive, she was found to have a leukocytosis of 18 and an elevation in creatinine from 1-1.2 at baseline to 1.8. TRH was asked for admission for hypotension, potential ongoing pneumonia and slurred speech.   Hospital Course: Discharge Diagnoses:  Active Problems:   Hypothyroidism   HLD (hyperlipidemia)   Dysarthria   Chronic back pain   Hypotension   Altered mental status   CKD (chronic kidney disease) stage 3, GFR 30-59 ml/min   Diabetes mellitus, type II, insulin  dependent (HCC)   Sepsis ([place])   AKI (acute kidney injury) ([place])   Acute kidney injury ([place])   HCAP (healthcare-associated pneumonia)   Sepsis due to HCAP - Patient with elevated white count 18, chest x-ray and symptoms consistent with ongoing pneumonia, she was hypotensive in the ED and admitted to SDU. Blood pressure improved, transferred to telemetry 1/13 and remained stable. Her sepsis physiology resolved, her WBC improved. Cultures have remained negative and she was transitioned to Levaquin to complete treatment as an outpatient.  Acute hypoxic respiratory failure - resolved, on room air Slurred speech - Likely in the setting of hypotension, MRI of the brain negative for stroke Back pain - MRI L spine with lumbar spondylosis and degenerative disc disease . Acute on chronic. Neurosurgery evaluated patient, no role for surgery right now.  Acute kidney injury and chronic kidney disease stage III - Likely in the setting of sepsis and poor by mouth intake over the last week at home. Resolved with fluids.  Chronic pain - Concern for overmedication, counseled Type 2 diabetes mellitus - Continue home medications Acute encephalopathy - resolved  Obstructive sleep apnea - Continue nightly CPAP Wrist pain - complains of significant right wrist, hand and forearm pain following a fall last November. XR negative. Wrist splint. Improving  Discharge Instructions  Allergies as of 03/03/2016      Reactions   Bee Venom Anaphylaxis   Shellfish Allergy Anaphylaxis   Buprenorphine Hcl Other (See Comments)   Overly sedated   Morphine And Related Other (See Comments)   Overly sedated      Medication List  TAKE these medications   albuterol (2.5 MG/3ML) 0.083% nebulizer solution Commonly known as:  PROVENTIL Inhale 3 mLs into the lungs every 6 (six) hours as needed. For shortness of breath   aspirin EC 81 MG tablet Take 1 tablet (81 mg total) by mouth daily.   B-complex with vitamin C  tablet Take 1 tablet by mouth daily.   carvedilol 3.125 MG tablet Commonly known as:  COREG Take 1 tablet (3.125 mg total) by mouth 2 (two) times daily with a meal.   doxepin 25 MG capsule Commonly known as:  SINEQUAN Take 50 mg by mouth at bedtime.   esomeprazole 40 MG capsule Commonly known as:  NEXIUM Take 1 capsule (40 mg total) by mouth daily before breakfast. What changed:  when to take this   ferrous sulfate 325 (65 FE) MG EC tablet Take 1 tablet (325 mg total) by mouth daily with breakfast.   furosemide 40 MG tablet Commonly known as:  LASIX Take 1 tablet (40 mg total) by mouth daily.   guaiFENesin 600 MG 12 hr tablet Commonly known as:  MUCINEX Take 1 tablet (600 mg total) by mouth 2 (two) times daily.   HUMALOG KWIKPEN 100 UNIT/ML KiwkPen Generic drug:  insulin lispro Inject 10 Units into the skin 3 (three) times daily.   LEVEMIR FLEXTOUCH 100 UNIT/ML Pen Generic drug:  Insulin Detemir Inject 10 Units into the skin at bedtime.   levofloxacin 750 MG tablet Commonly known as:  LEVAQUIN Take 1 tablet (750 mg total) by mouth daily.   levothyroxine 150 MCG tablet Commonly known as:  SYNTHROID, LEVOTHROID Take 1 tablet (150 mcg total) by mouth daily before breakfast.   LORazepam 2 MG tablet Commonly known as:  ATIVAN Take 1 tablet (2 mg total) by mouth every 12 (twelve) hours as needed for anxiety. What changed:  how much to take  when to take this  additional instructions   LYRICA 100 MG capsule Generic drug:  pregabalin Take 100 mg by mouth 2 (two) times daily.   NUCYNTA ER 250 MG Tb12 Generic drug:  Tapentadol HCl Take 250 mg by mouth every 12 (twelve) hours.   oxyCODONE 15 MG immediate release tablet Commonly known as:  ROXICODONE Take 1 tablet (15 mg total) by mouth every 6 (six) hours as needed for pain. What changed:  when to take this   polyethylene glycol packet Commonly known as:  MIRALAX / GLYCOLAX Take 17 g by mouth daily.    senna-docusate 8.6-50 MG tablet Commonly known as:  Senokot-S Take 2 tablets by mouth at bedtime.   sitaGLIPtin-metformin 50-500 MG tablet Commonly known as:  JANUMET Take 1 tablet by mouth 2 (two) times daily with a meal.   tiZANidine 2 MG tablet Commonly known as:  ZANAFLEX Take 2 mg by mouth 2 (two) times daily.   valACYclovir 1000 MG tablet Commonly known as:  VALTREX Take 1,000 mg by mouth 2 (two) times daily.   valsartan 40 MG tablet Commonly known as:  DIOVAN Take 1 tablet (40 mg total) by mouth daily.   venlafaxine XR 150 MG 24 hr capsule Commonly known as:  EFFEXOR-XR Take 150 mg by mouth 2 (two) times daily.   Vitamin D-3 1000 units Caps Take 1,000 Units by mouth daily.   ziprasidone 40 MG capsule Commonly known as:  GEODON Take 40 mg by mouth daily after supper.      Follow-up Information    [name], MD. Schedule an appointment as soon as possible  for a visit in 1 week(s).   Specialty:  Internal Medicine Contact information: [address] [place] [address] [phone]          Allergies  Allergen Reactions  . Bee Venom Anaphylaxis  . Shellfish Allergy Anaphylaxis  . Buprenorphine Hcl Other (See Comments)    Overly sedated  . Morphine And Related Other (See Comments)    Overly sedated    Consultations:  None   Procedures/Studies:  Dg Chest 2 View  Result Date: 02/13/2016 CLINICAL DATA:  Cough and congestion. Weakness and body aches for 3 days. EXAM: CHEST  2 VIEW COMPARISON:  06/24/2015 FINDINGS: AP and lateral views of the chest show borderline cardiomegaly. Vascular congestion noted with probable underlying chronic interstitial changes. Patchy airspace disease noted at the lung bases likely atelectatic although pneumonia not excluded. Surgical clips right lower neck suggest hemithyroidectomy. IMPRESSION: Borderline cardiomegaly with bibasilar atelectasis/ infiltrate. Electronically Signed   By: Misty Stanley M.D.    On: 02/13/2016 19:54   Dg Forearm Right  Result Date: 03/02/2016 CLINICAL DATA:  [place].  Pain. EXAM: RIGHT FOREARM - 2 VIEW COMPARISON:  None. FINDINGS: There is no evidence of fracture or other focal bone lesions. Mild soft tissue swelling. IMPRESSION: Mild soft tissue swelling.  No fracture. Electronically Signed   By: Staci Righter M.D.   On: 03/02/2016 14:03   Dg Wrist 2 Views Right  Result Date: 03/02/2016 CLINICAL DATA:  [place] in November.  Pain. EXAM: RIGHT WRIST - 2 VIEW COMPARISON:  None. FINDINGS: Mild irregularity of the distal ulnar styloid could represent a healed fracture, There is significant joint space narrowing at the radiocarpal, intercarpal, and carpometacarpal joints, particularly the first [place]. No transverse radius fracture. Mild soft tissue swelling. IMPRESSION: Advanced degenerative change of the wrist. Mild irregularity of the distal ulnar styloid could represent a healed fracture. Electronically Signed   By: Staci Righter M.D.   On: 03/02/2016 14:02   Ct Chest Wo Contrast  Result Date: 02/14/2016 CLINICAL DATA:  Short of breath and cough for 2 weeks. No chest pain. EXAM: CT CHEST WITHOUT CONTRAST TECHNIQUE: Multidetector CT imaging of the chest was performed following the standard protocol without IV contrast. COMPARISON:  Chest radiograph, 02/13/2016.  Chest CT, 08/08/2013. FINDINGS: Cardiovascular: Heart is mildly enlarged. Mild moderate coronary artery calcifications. Mild enlargement of the main pulmonary artery, which measures 3.9 cm. Aorta is normal in caliber. Mediastinum/Nodes: Changes from right thyroid surgery. No neck base or axillary masses or adenopathy. No mediastinal or hilar masses. 11 mm right subcarinal lymph node. No other adenopathy. Lungs/Pleura: There are 2 areas of left upper lobe consolidation. Small foci of consolidation noted in the right upper lobe. There is dependent opacity in both lower lobes with milder dependent opacity noted in the right upper  lobe. No pleural effusion. No pneumothorax. Upper Abdomen: No acute abnormality. Musculoskeletal: No chest wall mass or suspicious bone lesions identified. IMPRESSION: 1. Multifocal pneumonia. Consolidation is most evident in the left upper lobe. 2. Dependent lung opacity most evident lower lobes is likely combination of infection and atelectasis. Electronically Signed   By: Lajean Manes M.D.   On: 02/14/2016 15:25   Mr Brain Wo Contrast  Result Date: 02/29/2016 CLINICAL DATA:  61 y/o  F; slurred speech. EXAM: MRI HEAD WITHOUT CONTRAST TECHNIQUE: Multiplanar, multiecho pulse sequences of the brain and surrounding structures were obtained without intravenous contrast. COMPARISON:  02/19/2016 CT head. FINDINGS: Brain: Motion degraded T1 sequence. No diffusion signal abnormality. Motion  degraded axial T2 weighted sequence. No significant T2 FLAIR signal abnormality of brain parenchyma. No focal mass effect, extra-axial collection, or hydrocephalus. Motion degraded SWAN sequence, no gross susceptibility artifact to suggest intracranial hemorrhage. Vascular: Normal flow voids. Skull and upper cervical spine: Normal marrow signal. Sinuses/Orbits: Negative. Other: None. IMPRESSION: Motion degraded study. No evidence of acute infarct, hemorrhage, or mass effect. Electronically Signed   By: Kristine Garbe M.D.   On: 02/29/2016 22:27   Mr Lumbar Spine W Wo Contrast  Result Date: 03/01/2016 CLINICAL DATA:  Right leg weakness.  Low back pain. EXAM: MRI LUMBAR SPINE WITHOUT AND WITH CONTRAST TECHNIQUE: Multiplanar and multiecho pulse sequences of the lumbar spine were obtained without and with intravenous contrast. CONTRAST:  3m MULTIHANCE GADOBENATE DIMEGLUMINE 529 MG/ML IV SOLN COMPARISON:  Multiple exams, including 08/14/2014 FINDINGS: Body habitus reduces diagnostic sensitivity and specificity. Despite efforts by the technologist and patient, motion artifact is present on today's exam and could not be  eliminated. This reduces exam sensitivity and specificity. Segmentation: The lowest lumbar type non-rib-bearing vertebra is labeled as L5. Alignment: 3 mm of grade 1 anterolisthesis at L4-5 attributed to degenerative facet arthropathy. Vertebrae: Mild disc desiccation at L4-5 and L5-S1. No significant abnormal vertebral enhancement. No compelling findings of arachnoiditis or abnormal epidural fluid collection. Conus medullaris: Extends to the L1 level and appears normal. Paraspinal and other soft tissues: Unremarkable Disc levels: L1-2:  Unremarkable. L2-3: Mild right subarticular lateral recess stenosis and borderline right foraminal stenosis due to right lateral recess and foraminal disc protrusion, similar to prior. L3-4: Moderate to prominent central narrowing of the thecal sac with moderate bilateral subarticular lateral recess stenosis and moderate bilateral foraminal stenosis due to right greater than left facet arthropathy and diffuse disc bulge. The left foraminal stenosis is mildly worsened and prior. Right greater than left facet joint effusions. L4-5: Moderate to prominent central narrowing of the thecal sac with mild to moderate bilateral foraminal stenosis and moderate bilateral subarticular lateral recess stenosis due to disc bulge and facet arthropathy, degree of impingement mildly worsened from prior. Right greater than left facet joint effusions. L5-S1: Moderate left and mild right subarticular lateral recess stenosis along with mild left and borderline right foraminal stenosis due to left paracentral disc protrusion extending caudad, the protrusion is shown on image 9/8. Mildly worsened from prior. IMPRESSION: 1. Mildly progressive lumbar spondylosis and degenerative disc disease compared to prior, causing a moderate to prominent impingement at L3-4 and L4-5 ; moderate impingement at L5-S1; mild impingement at L2- 3, as detailed above. 2. Reduced sensitivity and specificity due to motion artifact  and body habitus. Electronically Signed   By: WVan ClinesM.D.   On: 03/01/2016 11:55   Dg Hand 2 View Right  Result Date: 03/02/2016 CLINICAL DATA:  Evaluate RIGHT hand.  [place] November. EXAM: RIGHT HAND - 2 VIEW COMPARISON:  None. FINDINGS: There is no evidence of fracture or dislocation. There is no evidence of erosive arthropathy or other focal bone abnormality. Soft tissues are unremarkable. IMPRESSION: Negative. Electronically Signed   By: JStaci RighterM.D.   On: 03/02/2016 14:00   Dg Chest Portable 1 View  Result Date: 02/29/2016 CLINICAL DATA:  Lethargy.  Hypoxia. EXAM: PORTABLE CHEST 1 VIEW COMPARISON:  02/13/2016 chest radiographs.  02/14/2016 chest CT. FINDINGS: The cardiomediastinal silhouette is unchanged. Surgical clips are again seen in the right lower neck related to prior thyroidectomy. There is improved aeration of the right lung base. Ill-defined opacity persists in the left  perihilar region. Left basilar opacity has improved. No sizable pleural effusion or pneumothorax is identified. IMPRESSION: Improved aeration of the lung bases. Persistent left midlung opacity concerning for pneumonia. Electronically Signed   By: Logan Bores M.D.   On: 02/29/2016 12:48   Ct Head Code Stroke W/o Cm  Addendum Date: 02/29/2016   ADDENDUM REPORT: 02/29/2016 12:05 ADDENDUM: Case discussed with Dr Alver Fisher via phone at 12:03 p.m. Electronically Signed   By: Monte Fantasia M.D.   On: 02/29/2016 12:05   Result Date: 02/29/2016 CLINICAL DATA:  Code stroke.  Slurred speech. EXAM: CT HEAD WITHOUT CONTRAST TECHNIQUE: Contiguous axial images were obtained from the base of the skull through the vertex without intravenous contrast. COMPARISON:  11/12/2014 FINDINGS: Brain: No evidence of acute infarction, hemorrhage, hydrocephalus, extra-axial collection or mass lesion/mass effect. Vascular: No hyperdense vessel or unexpected calcification. Skull: 14 mm low left scalp lesion with ill-defined margins  measuring up to 14 mm. There may be skin retraction, although there also diffuse skin folds. Sinuses/Orbits: Chronic proptosis, known. Patient has history of thyroid disease in this could be related orbitopathy. Other: Page sent out at the time of interpretation on 02/29/2016 at 11:45 am to Dr. Alver Fisher. Text page with results sent out at 11:49 a.m. Awaiting callback verification. ASPECTS [facility] Stroke Program Early CT Score) - Ganglionic level infarction (caudate, lentiform nuclei, internal capsule, insula, M1-M3 cortex): 7 - Supraganglionic infarction (M4-M6 cortex): 3 Total score (0-10 with 10 being normal): 10 IMPRESSION: 1. No acute finding. ASPECTS is 10. 2. 14 mm low left scalp lesion which is new from 2016 and has an infiltrative appearance. This could be postinflammatory or neoplastic and skin exam is recommended. Electronically Signed: By: Monte Fantasia M.D. On: 02/29/2016 11:50     Subjective: - no chest pain, shortness of breath, no abdominal pain, nausea or vomiting.   Discharge Exam: Vitals:   03/03/16 0444 03/03/16 0940  BP: (!) 150/66 (!) 150/77  Pulse: 92 87  Resp: 16 18  Temp: 98.5 F (36.9 C) 98.4 F (36.9 C)   Vitals:   03/02/16 1738 03/02/16 2104 03/03/16 0444 03/03/16 0940  BP: (!) 158/65 (!) 149/70 (!) 150/66 (!) 150/77  Pulse: 92 90 92 87  Resp: _0 Temp: 98.5 F (36.9 C) 98.9 F (37.2 C) 98.5 F (36.9 C) 98.4 F (36.9 C)  TempSrc: Oral Oral Oral Oral  SpO2: 96% 96% 96% 96%  Weight:      Height:        General: Pt is alert, awake, not in acute distress Cardiovascular: RRR, S1/S2 +, no rubs, no gallops Respiratory: CTA bilaterally, no wheezing, no rhonchi Abdominal: Soft, NT, ND, bowel sounds + Extremities: no edema, no cyanosis    The results of significant diagnostics from this hospitalization (including imaging, microbiology, ancillary and laboratory) are listed below for reference.     Microbiology: Recent Results (from the past 240  hour(s))  Culture, blood (Routine x 2)     Status: None (Preliminary result)   Collection Time: 02/29/16  2:05 PM  Result Value Ref Range Status   Specimen Description BLOOD RIGHT ANTECUBITAL  Final   Special Requests BOTTLES DRAWN AEROBIC AND ANAEROBIC 10CC  Final   Culture NO GROWTH 3 DAYS  Final   Report Status PENDING  Incomplete  Culture, blood (Routine x 2)     Status: None (Preliminary result)   Collection Time: 02/29/16  2:10 PM  Result Value Ref Range Status   Specimen Description  BLOOD RIGHT HAND  Final   Special Requests IN PEDIATRIC BOTTLE 3CC  Final   Culture NO GROWTH 3 DAYS  Final   Report Status PENDING  Incomplete  MRSA PCR Screening     Status: None   Collection Time: 02/29/16  7:06 PM  Result Value Ref Range Status   MRSA by PCR NEGATIVE NEGATIVE Final    Comment:        The GeneXpert MRSA Assay (FDA approved for NASAL specimens only), is one component of a comprehensive MRSA colonization surveillance program. It is not intended to diagnose MRSA infection nor to guide or monitor treatment for MRSA infections.      Labs: BNP (last 3 results)  Recent Labs  06/23/15 1333 02/14/16 0531  BNP 162.2* 23.7   Basic Metabolic Panel:  Recent Labs Lab 02/29/16 1127 02/29/16 1133 03/01/16 0253 03/02/16 1007  NA 132* 133* 136 138  K 4.2 4.1 4.2 4.2  CL 95* 96* 101 104  CO2 24  --  26 25  GLUCOSE 195* 193* 117* 134*  BUN 21* 23* 22* 16  CREATININE 1.82* 1.80* 1.78* 1.07*  CALCIUM 8.9  --  8.5* 9.6   Liver Function Tests:  Recent Labs Lab 02/29/16 1127 03/01/16 0253  AST 47* 23  ALT 27 22  ALKPHOS 67 63  BILITOT 0.3 0.6  PROT 6.0* 5.5*  ALBUMIN 3.2* 2.6*   No results for input(s): LIPASE, AMYLASE in the last 168 hours. No results for input(s): AMMONIA in the last 168 hours. CBC:  Recent Labs Lab 02/29/16 1127 02/29/16 1133 03/01/16 0253 03/02/16 1007  WBC 18.4*  --  17.0* 11.2*  NEUTROABS 15.9*  --   --   --   HGB 8.9* 9.2* 7.5*  8.7*  HCT 28.1* 27.0* 24.1* 27.3*  MCV 84.6  --  84.6 84.0  PLT 305  --  303 306   Cardiac Enzymes:  Recent Labs Lab 02/29/16 1127  TROPONINI <0.03   BNP: Invalid input(s): POCBNP CBG:  Recent Labs Lab 03/02/16 1220 03/02/16 1737 03/02/16 2103 03/03/16 0744 03/03/16 1149  GLUCAP 108* 130* 93 101* 106*   D-Dimer No results for input(s): DDIMER in the last 72 hours. Hgb A1c No results for input(s): HGBA1C in the last 72 hours. Lipid Profile No results for input(s): CHOL, HDL, LDLCALC, TRIG, CHOLHDL, LDLDIRECT in the last 72 hours. Thyroid function studies No results for input(s): TSH, T4TOTAL, T3FREE, THYROIDAB in the last 72 hours.  Invalid input(s): FREET3 Anemia work up No results for input(s): VITAMINB12, FOLATE, FERRITIN, TIBC, IRON, RETICCTPCT in the last 72 hours. Urinalysis    Component Value Date/Time   COLORURINE AMBER (A) 02/29/2016 1611   APPEARANCEUR HAZY (A) 02/29/2016 1611   APPEARANCEUR Clear 11/13/2013 1310   LABSPEC 1.019 02/29/2016 1611   LABSPEC 1.017 11/13/2013 1310   PHURINE 5.0 02/29/2016 1611   GLUCOSEU NEGATIVE 02/29/2016 1611   GLUCOSEU Negative 11/13/2013 1310   GLUCOSEU NEG mg/dL 03/02/2006 2050   HGBUR NEGATIVE 02/29/2016 1611   BILIRUBINUR SMALL (A) 02/29/2016 1611   BILIRUBINUR Negative 11/13/2013 1310   KETONESUR NEGATIVE 02/29/2016 1611   PROTEINUR NEGATIVE 02/29/2016 1611   UROBILINOGEN 0.2 11/12/2014 2057   NITRITE NEGATIVE 02/29/2016 1611   LEUKOCYTESUR NEGATIVE 02/29/2016 1611   LEUKOCYTESUR Trace 11/13/2013 1310   Sepsis Labs Invalid input(s): PROCALCITONIN,  WBC,  LACTICIDVEN Microbiology Recent Results (from the past 240 hour(s))  Culture, blood (Routine x 2)     Status: None (Preliminary result)   Collection  Time: 02/29/16  2:05 PM  Result Value Ref Range Status   Specimen Description BLOOD RIGHT ANTECUBITAL  Final   Special Requests BOTTLES DRAWN AEROBIC AND ANAEROBIC 10CC  Final   Culture NO GROWTH 3 DAYS   Final   Report Status PENDING  Incomplete  Culture, blood (Routine x 2)     Status: None (Preliminary result)   Collection Time: 02/29/16  2:10 PM  Result Value Ref Range Status   Specimen Description BLOOD RIGHT HAND  Final   Special Requests IN PEDIATRIC BOTTLE 3CC  Final   Culture NO GROWTH 3 DAYS  Final   Report Status PENDING  Incomplete  MRSA PCR Screening     Status: None   Collection Time: 02/29/16  7:06 PM  Result Value Ref Range Status   MRSA by PCR NEGATIVE NEGATIVE Final    Comment:        The GeneXpert MRSA Assay (FDA approved for NASAL specimens only), is one component of a comprehensive MRSA colonization surveillance program. It is not intended to diagnose MRSA infection nor to guide or monitor treatment for MRSA infections.      Time coordinating discharge: Over 30 minutes  SIGNED:  Marzetta Board, MD  [facility] 03/03/2016, 3:49 PM Pager [phone]  If 7PM-7AM, please contact night-coverage [email or web address] Password [email or web address]

## 2016-03-03 NOTE — Progress Notes (Signed)
Physical Therapy Treatment Patient Details Name: TOSCA PLETZ MRN: [id number] DOB: 1955-09-28 Today's Date: 03/03/2016    History of Present Illness Pt is a 61 y/o female admitted secondary to slurred speech, originally thought to have had a stroke; however, found to have early sepsis secondary to HCAP. Of note, pt recently admitted and discharged on 02/17/16. PMH including but not limited to DM, hypothyroidism, morbid obesity, DM, HTN, HLD, CKD, breast cancer and schizophrenia.    PT Comments    Mobility and Gait mostly limited by pain rather than PNA. Hopefully pt can get in to see Dr. Sharol Given and get imagine of L knee and back.  Follow Up Recommendations  Home health PT;Supervision/Assistance - 24 hour     Equipment Recommendations  None recommended by PT    Recommendations for Other Services       Precautions / Restrictions Precautions Precautions: Fall Precaution Comments: Mult falls    Mobility  Bed Mobility Overal bed mobility: Needs Assistance Bed Mobility: Supine to Sit     Supine to sit: Min guard     General bed mobility comments: increased time and effortful/painful movement, but no assist needed  Transfers Overall transfer level: Needs assistance Equipment used: 4-wheeled walker Transfers: Sit to/from Stand Sit to Stand: Min guard         General transfer comment: pt scooted to EOB and stood safely without assist  Ambulation/Gait Ambulation/Gait assistance: Min guard Ambulation Distance (Feet): 18 Feet Assistive device: 4-wheeled walker Gait Pattern/deviations: Step-through pattern;Decreased step length - right;Decreased step length - left     General Gait Details: effortful gait with excessive w/shift to swing through.  Rollator kept high so pt can use forearms on R as necessary.   Stairs            Wheelchair Mobility    Modified Rankin (Stroke Patients Only)       Balance Overall balance assessment: Needs assistance    Sitting balance-Leahy Scale: Fair     Standing balance support: Bilateral upper extremity supported;During functional activity Standing balance-Leahy Scale: Poor Standing balance comment: reliant on UE's due to pain and stability                    Cognition Arousal/Alertness: Awake/alert Behavior During Therapy: WFL for tasks assessed/performed Overall Cognitive Status: Within Functional Limits for tasks assessed                      Exercises General Exercises - Lower Extremity Heel Slides: AROM;Both;10 reps;Supine (graded resistance for checking L LE and warm up.)    General Comments        Pertinent Vitals/Pain Pain Assessment: Faces Faces Pain Scale: Hurts even more Pain Location: R wrist and L knee Pain Descriptors / Indicators: Moaning;Sore Pain Intervention(s): Monitored during session;Repositioned;Limited activity within patient's tolerance    Home Living                      Prior Function            PT Goals (current goals can now be found in the care plan section) Acute Rehab PT Goals Patient Stated Goal: decrease pain PT Goal Formulation: With patient Time For Goal Achievement: 03/15/16 Potential to Achieve Goals: Fair Progress towards PT goals: Progressing toward goals    Frequency    Min 3X/week      PT Plan Current plan remains appropriate    Co-evaluation  End of Session   Activity Tolerance: Patient limited by pain;Patient tolerated treatment well Patient left: in chair;with call bell/phone within reach;with chair alarm set     Time: 1916-6060 PT Time Calculation (min) (ACUTE ONLY): 24 min  Charges:  $Gait Training: 8-22 mins $Therapeutic Exercise: 8-22 mins                    G CodesTessie Fass Aurianna Earlywine 03/03/2016, 4:16 PM 03/03/2016  Donnella Sham, [place] [phone]  (pager)

## 2016-03-03 NOTE — Discharge Instructions (Signed)
Follow with [name], MD in 5-7 days  Please get a complete blood count and chemistry panel checked by your Primary MD at your next visit, and again as instructed by your Primary MD. Please get your medications reviewed and adjusted by your Primary MD.  Please request your Primary MD to go over all Hospital Tests and Procedure/Radiological results at the follow up, please get all Hospital records sent to your Prim MD by signing hospital release before you go home.  If you had Pneumonia of Lung problems at the Hospital: Please get a 2 view Chest X ray done in 6-8 weeks after hospital discharge or sooner if instructed by your Primary MD.  If you have Congestive Heart Failure: Please call your Cardiologist or Primary MD anytime you have any of the following symptoms:  1) 3 pound weight gain in 24 hours or 5 pounds in 1 week  2) shortness of breath, with or without a dry hacking cough  3) swelling in the hands, feet or stomach  4) if you have to sleep on extra pillows at night in order to breathe  Follow cardiac low salt diet and 1.5 lit/day fluid restriction.  If you have diabetes Accuchecks 4 times/day, Once in AM empty stomach and then before each meal. Log in all results and show them to your primary doctor at your next visit. If any glucose reading is under 80 or above 300 call your primary MD immediately.  If you have Seizure/Convulsions/Epilepsy: Please do not drive, operate heavy machinery, participate in activities at heights or participate in high speed sports until you have seen by Primary MD or a Neurologist and advised to do so again.  If you had Gastrointestinal Bleeding: Please ask your Primary MD to check a complete blood count within one week of discharge or at your next visit. Your endoscopic/colonoscopic biopsies that are pending at the time of discharge, will also need to followed by your Primary MD.  Get Medicines reviewed and adjusted. Please take all your  medications with you for your next visit with your Primary MD  Please request your Primary MD to go over all hospital tests and procedure/radiological results at the follow up, please ask your Primary MD to get all Hospital records sent to his/her office.  If you experience worsening of your admission symptoms, develop shortness of breath, life threatening emergency, suicidal or homicidal thoughts you must seek medical attention immediately by calling 911 or calling your MD immediately  if symptoms less severe.  You must read complete instructions/literature along with all the possible adverse reactions/side effects for all the Medicines you take and that have been prescribed to you. Take any new Medicines after you have completely understood and accpet all the possible adverse reactions/side effects.   Do not drive or operate heavy machinery when taking Pain medications.   Do not take more than prescribed Pain, Sleep and Anxiety Medications  Special Instructions: If you have smoked or chewed Tobacco  in the last 2 yrs please stop smoking, stop any regular Alcohol  and or any Recreational drug use.  Wear Seat belts while driving.  Please note You were cared for by a hospitalist during your hospital stay. If you have any questions about your discharge medications or the care you received while you were in the hospital after you are discharged, you can call the unit and asked to speak with the hospitalist on call if the hospitalist that took care of you is not available.  Once you are discharged, your primary care physician will handle any further medical issues. Please note that NO REFILLS for any discharge medications will be authorized once you are discharged, as it is imperative that you return to your primary care physician (or establish a relationship with a primary care physician if you do not have one) for your aftercare needs so that they can reassess your need for medications and monitor your  lab values.  You can reach the hospitalist office at phone [phone] or fax [phone]   If you do not have a primary care physician, you can call [phone] for a physician referral.  Activity: As tolerated with Full fall precautions use walker/cane & assistance as needed  Diet: heart healthy  Disposition Home

## 2016-03-03 NOTE — Progress Notes (Signed)
Patient being discharged to home with daughter. All discharged instructions reviewed. IV removed. Telemetry removed. Script given for Levaquin. Patient aware to followup with PCP within 1 week. All belongings in tow with patient. Patient left unit in stable condition in wheelchair.  Sheliah Plane RN

## 2016-03-04 DIAGNOSIS — E039 Hypothyroidism, unspecified: Secondary | ICD-10-CM | POA: Diagnosis not present

## 2016-03-04 DIAGNOSIS — Z79891 Long term (current) use of opiate analgesic: Secondary | ICD-10-CM | POA: Diagnosis not present

## 2016-03-04 DIAGNOSIS — E785 Hyperlipidemia, unspecified: Secondary | ICD-10-CM | POA: Diagnosis not present

## 2016-03-04 DIAGNOSIS — G20 Parkinson's disease: Secondary | ICD-10-CM | POA: Diagnosis not present

## 2016-03-04 DIAGNOSIS — G4733 Obstructive sleep apnea (adult) (pediatric): Secondary | ICD-10-CM | POA: Diagnosis not present

## 2016-03-04 DIAGNOSIS — N183 Chronic kidney disease, stage 3 (moderate): Secondary | ICD-10-CM | POA: Diagnosis not present

## 2016-03-04 DIAGNOSIS — D631 Anemia in chronic kidney disease: Secondary | ICD-10-CM | POA: Diagnosis not present

## 2016-03-04 DIAGNOSIS — I129 Hypertensive chronic kidney disease with stage 1 through stage 4 chronic kidney disease, or unspecified chronic kidney disease: Secondary | ICD-10-CM | POA: Diagnosis not present

## 2016-03-04 DIAGNOSIS — Z794 Long term (current) use of insulin: Secondary | ICD-10-CM | POA: Diagnosis not present

## 2016-03-04 DIAGNOSIS — E1122 Type 2 diabetes mellitus with diabetic chronic kidney disease: Secondary | ICD-10-CM | POA: Diagnosis not present

## 2016-03-04 DIAGNOSIS — K219 Gastro-esophageal reflux disease without esophagitis: Secondary | ICD-10-CM | POA: Diagnosis not present

## 2016-03-04 DIAGNOSIS — Z9181 History of falling: Secondary | ICD-10-CM | POA: Diagnosis not present

## 2016-03-04 DIAGNOSIS — D509 Iron deficiency anemia, unspecified: Secondary | ICD-10-CM | POA: Diagnosis not present

## 2016-03-04 DIAGNOSIS — J189 Pneumonia, unspecified organism: Secondary | ICD-10-CM | POA: Diagnosis not present

## 2016-03-04 DIAGNOSIS — G8929 Other chronic pain: Secondary | ICD-10-CM | POA: Diagnosis not present

## 2016-03-04 DIAGNOSIS — R296 Repeated falls: Secondary | ICD-10-CM | POA: Diagnosis not present

## 2016-03-05 LAB — CULTURE, BLOOD (ROUTINE X 2)
CULTURE: NO GROWTH
Culture: NO GROWTH

## 2016-03-07 DIAGNOSIS — K219 Gastro-esophageal reflux disease without esophagitis: Secondary | ICD-10-CM | POA: Diagnosis not present

## 2016-03-07 DIAGNOSIS — Z9181 History of falling: Secondary | ICD-10-CM | POA: Diagnosis not present

## 2016-03-07 DIAGNOSIS — Z79891 Long term (current) use of opiate analgesic: Secondary | ICD-10-CM | POA: Diagnosis not present

## 2016-03-07 DIAGNOSIS — E785 Hyperlipidemia, unspecified: Secondary | ICD-10-CM | POA: Diagnosis not present

## 2016-03-07 DIAGNOSIS — R296 Repeated falls: Secondary | ICD-10-CM | POA: Diagnosis not present

## 2016-03-07 DIAGNOSIS — N183 Chronic kidney disease, stage 3 (moderate): Secondary | ICD-10-CM | POA: Diagnosis not present

## 2016-03-07 DIAGNOSIS — D631 Anemia in chronic kidney disease: Secondary | ICD-10-CM | POA: Diagnosis not present

## 2016-03-07 DIAGNOSIS — G8929 Other chronic pain: Secondary | ICD-10-CM | POA: Diagnosis not present

## 2016-03-07 DIAGNOSIS — G20 Parkinson's disease: Secondary | ICD-10-CM | POA: Diagnosis not present

## 2016-03-07 DIAGNOSIS — E1122 Type 2 diabetes mellitus with diabetic chronic kidney disease: Secondary | ICD-10-CM | POA: Diagnosis not present

## 2016-03-07 DIAGNOSIS — Z794 Long term (current) use of insulin: Secondary | ICD-10-CM | POA: Diagnosis not present

## 2016-03-07 DIAGNOSIS — D509 Iron deficiency anemia, unspecified: Secondary | ICD-10-CM | POA: Diagnosis not present

## 2016-03-07 DIAGNOSIS — J189 Pneumonia, unspecified organism: Secondary | ICD-10-CM | POA: Diagnosis not present

## 2016-03-07 DIAGNOSIS — E039 Hypothyroidism, unspecified: Secondary | ICD-10-CM | POA: Diagnosis not present

## 2016-03-07 DIAGNOSIS — G4733 Obstructive sleep apnea (adult) (pediatric): Secondary | ICD-10-CM | POA: Diagnosis not present

## 2016-03-07 DIAGNOSIS — I129 Hypertensive chronic kidney disease with stage 1 through stage 4 chronic kidney disease, or unspecified chronic kidney disease: Secondary | ICD-10-CM | POA: Diagnosis not present

## 2016-03-08 DIAGNOSIS — Z9181 History of falling: Secondary | ICD-10-CM | POA: Diagnosis not present

## 2016-03-08 DIAGNOSIS — E1122 Type 2 diabetes mellitus with diabetic chronic kidney disease: Secondary | ICD-10-CM | POA: Diagnosis not present

## 2016-03-08 DIAGNOSIS — G8929 Other chronic pain: Secondary | ICD-10-CM | POA: Diagnosis not present

## 2016-03-08 DIAGNOSIS — G4733 Obstructive sleep apnea (adult) (pediatric): Secondary | ICD-10-CM | POA: Diagnosis not present

## 2016-03-08 DIAGNOSIS — D631 Anemia in chronic kidney disease: Secondary | ICD-10-CM | POA: Diagnosis not present

## 2016-03-08 DIAGNOSIS — R296 Repeated falls: Secondary | ICD-10-CM | POA: Diagnosis not present

## 2016-03-08 DIAGNOSIS — E785 Hyperlipidemia, unspecified: Secondary | ICD-10-CM | POA: Diagnosis not present

## 2016-03-08 DIAGNOSIS — I129 Hypertensive chronic kidney disease with stage 1 through stage 4 chronic kidney disease, or unspecified chronic kidney disease: Secondary | ICD-10-CM | POA: Diagnosis not present

## 2016-03-08 DIAGNOSIS — E039 Hypothyroidism, unspecified: Secondary | ICD-10-CM | POA: Diagnosis not present

## 2016-03-08 DIAGNOSIS — G20 Parkinson's disease: Secondary | ICD-10-CM | POA: Diagnosis not present

## 2016-03-08 DIAGNOSIS — Z794 Long term (current) use of insulin: Secondary | ICD-10-CM | POA: Diagnosis not present

## 2016-03-08 DIAGNOSIS — N183 Chronic kidney disease, stage 3 (moderate): Secondary | ICD-10-CM | POA: Diagnosis not present

## 2016-03-08 DIAGNOSIS — K219 Gastro-esophageal reflux disease without esophagitis: Secondary | ICD-10-CM | POA: Diagnosis not present

## 2016-03-08 DIAGNOSIS — J189 Pneumonia, unspecified organism: Secondary | ICD-10-CM | POA: Diagnosis not present

## 2016-03-08 DIAGNOSIS — Z79891 Long term (current) use of opiate analgesic: Secondary | ICD-10-CM | POA: Diagnosis not present

## 2016-03-08 DIAGNOSIS — D509 Iron deficiency anemia, unspecified: Secondary | ICD-10-CM | POA: Diagnosis not present

## 2016-03-10 DIAGNOSIS — Z794 Long term (current) use of insulin: Secondary | ICD-10-CM | POA: Diagnosis not present

## 2016-03-10 DIAGNOSIS — G20 Parkinson's disease: Secondary | ICD-10-CM | POA: Diagnosis not present

## 2016-03-10 DIAGNOSIS — K219 Gastro-esophageal reflux disease without esophagitis: Secondary | ICD-10-CM | POA: Diagnosis not present

## 2016-03-10 DIAGNOSIS — E039 Hypothyroidism, unspecified: Secondary | ICD-10-CM | POA: Diagnosis not present

## 2016-03-10 DIAGNOSIS — J189 Pneumonia, unspecified organism: Secondary | ICD-10-CM | POA: Diagnosis not present

## 2016-03-10 DIAGNOSIS — I129 Hypertensive chronic kidney disease with stage 1 through stage 4 chronic kidney disease, or unspecified chronic kidney disease: Secondary | ICD-10-CM | POA: Diagnosis not present

## 2016-03-10 DIAGNOSIS — R296 Repeated falls: Secondary | ICD-10-CM | POA: Diagnosis not present

## 2016-03-10 DIAGNOSIS — E1122 Type 2 diabetes mellitus with diabetic chronic kidney disease: Secondary | ICD-10-CM | POA: Diagnosis not present

## 2016-03-10 DIAGNOSIS — G8929 Other chronic pain: Secondary | ICD-10-CM | POA: Diagnosis not present

## 2016-03-10 DIAGNOSIS — D509 Iron deficiency anemia, unspecified: Secondary | ICD-10-CM | POA: Diagnosis not present

## 2016-03-10 DIAGNOSIS — D631 Anemia in chronic kidney disease: Secondary | ICD-10-CM | POA: Diagnosis not present

## 2016-03-10 DIAGNOSIS — Z9181 History of falling: Secondary | ICD-10-CM | POA: Diagnosis not present

## 2016-03-10 DIAGNOSIS — G4733 Obstructive sleep apnea (adult) (pediatric): Secondary | ICD-10-CM | POA: Diagnosis not present

## 2016-03-10 DIAGNOSIS — N183 Chronic kidney disease, stage 3 (moderate): Secondary | ICD-10-CM | POA: Diagnosis not present

## 2016-03-10 DIAGNOSIS — Z79891 Long term (current) use of opiate analgesic: Secondary | ICD-10-CM | POA: Diagnosis not present

## 2016-03-10 DIAGNOSIS — E785 Hyperlipidemia, unspecified: Secondary | ICD-10-CM | POA: Diagnosis not present

## 2016-03-11 DIAGNOSIS — G4733 Obstructive sleep apnea (adult) (pediatric): Secondary | ICD-10-CM | POA: Diagnosis not present

## 2016-03-11 DIAGNOSIS — J189 Pneumonia, unspecified organism: Secondary | ICD-10-CM | POA: Diagnosis not present

## 2016-03-11 DIAGNOSIS — D631 Anemia in chronic kidney disease: Secondary | ICD-10-CM | POA: Diagnosis not present

## 2016-03-11 DIAGNOSIS — Z794 Long term (current) use of insulin: Secondary | ICD-10-CM | POA: Diagnosis not present

## 2016-03-11 DIAGNOSIS — I129 Hypertensive chronic kidney disease with stage 1 through stage 4 chronic kidney disease, or unspecified chronic kidney disease: Secondary | ICD-10-CM | POA: Diagnosis not present

## 2016-03-11 DIAGNOSIS — E1122 Type 2 diabetes mellitus with diabetic chronic kidney disease: Secondary | ICD-10-CM | POA: Diagnosis not present

## 2016-03-11 DIAGNOSIS — Z79891 Long term (current) use of opiate analgesic: Secondary | ICD-10-CM | POA: Diagnosis not present

## 2016-03-11 DIAGNOSIS — N183 Chronic kidney disease, stage 3 (moderate): Secondary | ICD-10-CM | POA: Diagnosis not present

## 2016-03-11 DIAGNOSIS — Z9181 History of falling: Secondary | ICD-10-CM | POA: Diagnosis not present

## 2016-03-11 DIAGNOSIS — G20 Parkinson's disease: Secondary | ICD-10-CM | POA: Diagnosis not present

## 2016-03-11 DIAGNOSIS — R296 Repeated falls: Secondary | ICD-10-CM | POA: Diagnosis not present

## 2016-03-11 DIAGNOSIS — E785 Hyperlipidemia, unspecified: Secondary | ICD-10-CM | POA: Diagnosis not present

## 2016-03-11 DIAGNOSIS — E039 Hypothyroidism, unspecified: Secondary | ICD-10-CM | POA: Diagnosis not present

## 2016-03-11 DIAGNOSIS — D509 Iron deficiency anemia, unspecified: Secondary | ICD-10-CM | POA: Diagnosis not present

## 2016-03-11 DIAGNOSIS — K219 Gastro-esophageal reflux disease without esophagitis: Secondary | ICD-10-CM | POA: Diagnosis not present

## 2016-03-11 DIAGNOSIS — G8929 Other chronic pain: Secondary | ICD-10-CM | POA: Diagnosis not present

## 2016-03-13 DIAGNOSIS — G20 Parkinson's disease: Secondary | ICD-10-CM | POA: Diagnosis not present

## 2016-03-13 DIAGNOSIS — D509 Iron deficiency anemia, unspecified: Secondary | ICD-10-CM | POA: Diagnosis not present

## 2016-03-13 DIAGNOSIS — J189 Pneumonia, unspecified organism: Secondary | ICD-10-CM | POA: Diagnosis not present

## 2016-03-13 DIAGNOSIS — G8929 Other chronic pain: Secondary | ICD-10-CM | POA: Diagnosis not present

## 2016-03-13 DIAGNOSIS — K219 Gastro-esophageal reflux disease without esophagitis: Secondary | ICD-10-CM | POA: Diagnosis not present

## 2016-03-13 DIAGNOSIS — I129 Hypertensive chronic kidney disease with stage 1 through stage 4 chronic kidney disease, or unspecified chronic kidney disease: Secondary | ICD-10-CM | POA: Diagnosis not present

## 2016-03-13 DIAGNOSIS — R296 Repeated falls: Secondary | ICD-10-CM | POA: Diagnosis not present

## 2016-03-13 DIAGNOSIS — E785 Hyperlipidemia, unspecified: Secondary | ICD-10-CM | POA: Diagnosis not present

## 2016-03-13 DIAGNOSIS — E039 Hypothyroidism, unspecified: Secondary | ICD-10-CM | POA: Diagnosis not present

## 2016-03-13 DIAGNOSIS — G4733 Obstructive sleep apnea (adult) (pediatric): Secondary | ICD-10-CM | POA: Diagnosis not present

## 2016-03-13 DIAGNOSIS — D631 Anemia in chronic kidney disease: Secondary | ICD-10-CM | POA: Diagnosis not present

## 2016-03-13 DIAGNOSIS — Z9181 History of falling: Secondary | ICD-10-CM | POA: Diagnosis not present

## 2016-03-13 DIAGNOSIS — E1122 Type 2 diabetes mellitus with diabetic chronic kidney disease: Secondary | ICD-10-CM | POA: Diagnosis not present

## 2016-03-13 DIAGNOSIS — N183 Chronic kidney disease, stage 3 (moderate): Secondary | ICD-10-CM | POA: Diagnosis not present

## 2016-03-13 DIAGNOSIS — Z79891 Long term (current) use of opiate analgesic: Secondary | ICD-10-CM | POA: Diagnosis not present

## 2016-03-13 DIAGNOSIS — Z794 Long term (current) use of insulin: Secondary | ICD-10-CM | POA: Diagnosis not present

## 2016-03-18 DIAGNOSIS — E039 Hypothyroidism, unspecified: Secondary | ICD-10-CM | POA: Diagnosis not present

## 2016-03-18 DIAGNOSIS — D509 Iron deficiency anemia, unspecified: Secondary | ICD-10-CM | POA: Diagnosis not present

## 2016-03-18 DIAGNOSIS — I129 Hypertensive chronic kidney disease with stage 1 through stage 4 chronic kidney disease, or unspecified chronic kidney disease: Secondary | ICD-10-CM | POA: Diagnosis not present

## 2016-03-18 DIAGNOSIS — G8929 Other chronic pain: Secondary | ICD-10-CM | POA: Diagnosis not present

## 2016-03-18 DIAGNOSIS — G20 Parkinson's disease: Secondary | ICD-10-CM | POA: Diagnosis not present

## 2016-03-18 DIAGNOSIS — K219 Gastro-esophageal reflux disease without esophagitis: Secondary | ICD-10-CM | POA: Diagnosis not present

## 2016-03-18 DIAGNOSIS — Z794 Long term (current) use of insulin: Secondary | ICD-10-CM | POA: Diagnosis not present

## 2016-03-18 DIAGNOSIS — D631 Anemia in chronic kidney disease: Secondary | ICD-10-CM | POA: Diagnosis not present

## 2016-03-18 DIAGNOSIS — E1122 Type 2 diabetes mellitus with diabetic chronic kidney disease: Secondary | ICD-10-CM | POA: Diagnosis not present

## 2016-03-18 DIAGNOSIS — Z9181 History of falling: Secondary | ICD-10-CM | POA: Diagnosis not present

## 2016-03-18 DIAGNOSIS — R296 Repeated falls: Secondary | ICD-10-CM | POA: Diagnosis not present

## 2016-03-18 DIAGNOSIS — N183 Chronic kidney disease, stage 3 (moderate): Secondary | ICD-10-CM | POA: Diagnosis not present

## 2016-03-18 DIAGNOSIS — E785 Hyperlipidemia, unspecified: Secondary | ICD-10-CM | POA: Diagnosis not present

## 2016-03-18 DIAGNOSIS — G4733 Obstructive sleep apnea (adult) (pediatric): Secondary | ICD-10-CM | POA: Diagnosis not present

## 2016-03-18 DIAGNOSIS — J189 Pneumonia, unspecified organism: Secondary | ICD-10-CM | POA: Diagnosis not present

## 2016-03-18 DIAGNOSIS — Z79891 Long term (current) use of opiate analgesic: Secondary | ICD-10-CM | POA: Diagnosis not present

## 2016-03-19 DIAGNOSIS — E1122 Type 2 diabetes mellitus with diabetic chronic kidney disease: Secondary | ICD-10-CM | POA: Diagnosis not present

## 2016-03-19 DIAGNOSIS — I129 Hypertensive chronic kidney disease with stage 1 through stage 4 chronic kidney disease, or unspecified chronic kidney disease: Secondary | ICD-10-CM | POA: Diagnosis not present

## 2016-03-19 DIAGNOSIS — N183 Chronic kidney disease, stage 3 (moderate): Secondary | ICD-10-CM | POA: Diagnosis not present

## 2016-03-19 DIAGNOSIS — G20 Parkinson's disease: Secondary | ICD-10-CM | POA: Diagnosis not present

## 2016-03-19 DIAGNOSIS — Z794 Long term (current) use of insulin: Secondary | ICD-10-CM | POA: Diagnosis not present

## 2016-03-19 DIAGNOSIS — R296 Repeated falls: Secondary | ICD-10-CM | POA: Diagnosis not present

## 2016-03-19 DIAGNOSIS — E785 Hyperlipidemia, unspecified: Secondary | ICD-10-CM | POA: Diagnosis not present

## 2016-03-19 DIAGNOSIS — K219 Gastro-esophageal reflux disease without esophagitis: Secondary | ICD-10-CM | POA: Diagnosis not present

## 2016-03-19 DIAGNOSIS — Z9181 History of falling: Secondary | ICD-10-CM | POA: Diagnosis not present

## 2016-03-19 DIAGNOSIS — D509 Iron deficiency anemia, unspecified: Secondary | ICD-10-CM | POA: Diagnosis not present

## 2016-03-19 DIAGNOSIS — E039 Hypothyroidism, unspecified: Secondary | ICD-10-CM | POA: Diagnosis not present

## 2016-03-19 DIAGNOSIS — G8929 Other chronic pain: Secondary | ICD-10-CM | POA: Diagnosis not present

## 2016-03-19 DIAGNOSIS — G4733 Obstructive sleep apnea (adult) (pediatric): Secondary | ICD-10-CM | POA: Diagnosis not present

## 2016-03-19 DIAGNOSIS — J189 Pneumonia, unspecified organism: Secondary | ICD-10-CM | POA: Diagnosis not present

## 2016-03-19 DIAGNOSIS — Z79891 Long term (current) use of opiate analgesic: Secondary | ICD-10-CM | POA: Diagnosis not present

## 2016-03-19 DIAGNOSIS — D631 Anemia in chronic kidney disease: Secondary | ICD-10-CM | POA: Diagnosis not present

## 2016-03-20 DIAGNOSIS — K219 Gastro-esophageal reflux disease without esophagitis: Secondary | ICD-10-CM | POA: Diagnosis not present

## 2016-03-20 DIAGNOSIS — Z794 Long term (current) use of insulin: Secondary | ICD-10-CM | POA: Diagnosis not present

## 2016-03-20 DIAGNOSIS — N183 Chronic kidney disease, stage 3 (moderate): Secondary | ICD-10-CM | POA: Diagnosis not present

## 2016-03-20 DIAGNOSIS — E1122 Type 2 diabetes mellitus with diabetic chronic kidney disease: Secondary | ICD-10-CM | POA: Diagnosis not present

## 2016-03-20 DIAGNOSIS — G20 Parkinson's disease: Secondary | ICD-10-CM | POA: Diagnosis not present

## 2016-03-20 DIAGNOSIS — Z79891 Long term (current) use of opiate analgesic: Secondary | ICD-10-CM | POA: Diagnosis not present

## 2016-03-20 DIAGNOSIS — E785 Hyperlipidemia, unspecified: Secondary | ICD-10-CM | POA: Diagnosis not present

## 2016-03-20 DIAGNOSIS — Z9181 History of falling: Secondary | ICD-10-CM | POA: Diagnosis not present

## 2016-03-20 DIAGNOSIS — D631 Anemia in chronic kidney disease: Secondary | ICD-10-CM | POA: Diagnosis not present

## 2016-03-20 DIAGNOSIS — I129 Hypertensive chronic kidney disease with stage 1 through stage 4 chronic kidney disease, or unspecified chronic kidney disease: Secondary | ICD-10-CM | POA: Diagnosis not present

## 2016-03-20 DIAGNOSIS — G4733 Obstructive sleep apnea (adult) (pediatric): Secondary | ICD-10-CM | POA: Diagnosis not present

## 2016-03-20 DIAGNOSIS — E039 Hypothyroidism, unspecified: Secondary | ICD-10-CM | POA: Diagnosis not present

## 2016-03-20 DIAGNOSIS — R296 Repeated falls: Secondary | ICD-10-CM | POA: Diagnosis not present

## 2016-03-20 DIAGNOSIS — D509 Iron deficiency anemia, unspecified: Secondary | ICD-10-CM | POA: Diagnosis not present

## 2016-03-20 DIAGNOSIS — G8929 Other chronic pain: Secondary | ICD-10-CM | POA: Diagnosis not present

## 2016-03-20 DIAGNOSIS — J189 Pneumonia, unspecified organism: Secondary | ICD-10-CM | POA: Diagnosis not present

## 2016-03-25 ENCOUNTER — Telehealth (INDEPENDENT_AMBULATORY_CARE_PROVIDER_SITE_OTHER): Payer: Self-pay | Admitting: Orthopedic Surgery

## 2016-03-25 DIAGNOSIS — Z9181 History of falling: Secondary | ICD-10-CM | POA: Diagnosis not present

## 2016-03-25 DIAGNOSIS — N183 Chronic kidney disease, stage 3 (moderate): Secondary | ICD-10-CM | POA: Diagnosis not present

## 2016-03-25 DIAGNOSIS — I129 Hypertensive chronic kidney disease with stage 1 through stage 4 chronic kidney disease, or unspecified chronic kidney disease: Secondary | ICD-10-CM | POA: Diagnosis not present

## 2016-03-25 DIAGNOSIS — E039 Hypothyroidism, unspecified: Secondary | ICD-10-CM | POA: Diagnosis not present

## 2016-03-25 DIAGNOSIS — G20 Parkinson's disease: Secondary | ICD-10-CM | POA: Diagnosis not present

## 2016-03-25 DIAGNOSIS — D509 Iron deficiency anemia, unspecified: Secondary | ICD-10-CM | POA: Diagnosis not present

## 2016-03-25 DIAGNOSIS — D631 Anemia in chronic kidney disease: Secondary | ICD-10-CM | POA: Diagnosis not present

## 2016-03-25 DIAGNOSIS — Z794 Long term (current) use of insulin: Secondary | ICD-10-CM | POA: Diagnosis not present

## 2016-03-25 DIAGNOSIS — J189 Pneumonia, unspecified organism: Secondary | ICD-10-CM | POA: Diagnosis not present

## 2016-03-25 DIAGNOSIS — R296 Repeated falls: Secondary | ICD-10-CM | POA: Diagnosis not present

## 2016-03-25 DIAGNOSIS — G8929 Other chronic pain: Secondary | ICD-10-CM | POA: Diagnosis not present

## 2016-03-25 DIAGNOSIS — Z79891 Long term (current) use of opiate analgesic: Secondary | ICD-10-CM | POA: Diagnosis not present

## 2016-03-25 DIAGNOSIS — K219 Gastro-esophageal reflux disease without esophagitis: Secondary | ICD-10-CM | POA: Diagnosis not present

## 2016-03-25 DIAGNOSIS — E1122 Type 2 diabetes mellitus with diabetic chronic kidney disease: Secondary | ICD-10-CM | POA: Diagnosis not present

## 2016-03-25 DIAGNOSIS — E785 Hyperlipidemia, unspecified: Secondary | ICD-10-CM | POA: Diagnosis not present

## 2016-03-25 DIAGNOSIS — G4733 Obstructive sleep apnea (adult) (pediatric): Secondary | ICD-10-CM | POA: Diagnosis not present

## 2016-03-25 NOTE — Telephone Encounter (Signed)
No, we last ordered this in 2014

## 2016-03-25 NOTE — Telephone Encounter (Signed)
PATIENT CALLED NEEDING A REFILL ON VOLTAREN GEL. CB # [phone]

## 2016-03-26 NOTE — Telephone Encounter (Signed)
I called spoke with patient she has fallen multiple times, she is having increased knee pain. The patient was taken to hospital last time in office. She has other multiple medical problems, she currently has pneumonia. Advised her [name] would like to hold off on voltaren gel at this time. She will call us when she is feeling better to have her knee evaluated.

## 2016-03-27 ENCOUNTER — Encounter (HOSPITAL_COMMUNITY): Payer: Self-pay

## 2016-03-27 ENCOUNTER — Emergency Department (HOSPITAL_COMMUNITY): Payer: Medicare Other

## 2016-03-27 ENCOUNTER — Inpatient Hospital Stay (HOSPITAL_COMMUNITY)
Admission: EM | Admit: 2016-03-27 | Discharge: 2016-04-07 | DRG: 177 | Disposition: A | Discharge status: Skilled Nursing Facility | Payer: Medicare Other | Attending: Family Medicine | Admitting: Family Medicine

## 2016-03-27 DIAGNOSIS — Z91030 Bee allergy status: Secondary | ICD-10-CM

## 2016-03-27 DIAGNOSIS — R1110 Vomiting, unspecified: Secondary | ICD-10-CM

## 2016-03-27 DIAGNOSIS — M545 Low back pain: Secondary | ICD-10-CM | POA: Diagnosis present

## 2016-03-27 DIAGNOSIS — E1142 Type 2 diabetes mellitus with diabetic polyneuropathy: Secondary | ICD-10-CM | POA: Diagnosis present

## 2016-03-27 DIAGNOSIS — E1122 Type 2 diabetes mellitus with diabetic chronic kidney disease: Secondary | ICD-10-CM | POA: Diagnosis present

## 2016-03-27 DIAGNOSIS — I959 Hypotension, unspecified: Secondary | ICD-10-CM | POA: Diagnosis present

## 2016-03-27 DIAGNOSIS — J9601 Acute respiratory failure with hypoxia: Secondary | ICD-10-CM

## 2016-03-27 DIAGNOSIS — G4710 Hypersomnia, unspecified: Secondary | ICD-10-CM | POA: Diagnosis present

## 2016-03-27 DIAGNOSIS — Z9049 Acquired absence of other specified parts of digestive tract: Secondary | ICD-10-CM

## 2016-03-27 DIAGNOSIS — R1312 Dysphagia, oropharyngeal phase: Secondary | ICD-10-CM

## 2016-03-27 DIAGNOSIS — F419 Anxiety disorder, unspecified: Secondary | ICD-10-CM | POA: Diagnosis present

## 2016-03-27 DIAGNOSIS — I639 Cerebral infarction, unspecified: Secondary | ICD-10-CM

## 2016-03-27 DIAGNOSIS — R031 Nonspecific low blood-pressure reading: Secondary | ICD-10-CM | POA: Diagnosis not present

## 2016-03-27 DIAGNOSIS — Z8701 Personal history of pneumonia (recurrent): Secondary | ICD-10-CM

## 2016-03-27 DIAGNOSIS — E038 Other specified hypothyroidism: Secondary | ICD-10-CM | POA: Diagnosis not present

## 2016-03-27 DIAGNOSIS — M1990 Unspecified osteoarthritis, unspecified site: Secondary | ICD-10-CM | POA: Diagnosis not present

## 2016-03-27 DIAGNOSIS — Z888 Allergy status to other drugs, medicaments and biological substances status: Secondary | ICD-10-CM

## 2016-03-27 DIAGNOSIS — K449 Diaphragmatic hernia without obstruction or gangrene: Secondary | ICD-10-CM | POA: Diagnosis present

## 2016-03-27 DIAGNOSIS — F209 Schizophrenia, unspecified: Secondary | ICD-10-CM

## 2016-03-27 DIAGNOSIS — M6281 Muscle weakness (generalized): Secondary | ICD-10-CM | POA: Diagnosis not present

## 2016-03-27 DIAGNOSIS — K219 Gastro-esophageal reflux disease without esophagitis: Secondary | ICD-10-CM | POA: Diagnosis present

## 2016-03-27 DIAGNOSIS — D649 Anemia, unspecified: Secondary | ICD-10-CM | POA: Diagnosis present

## 2016-03-27 DIAGNOSIS — N179 Acute kidney failure, unspecified: Secondary | ICD-10-CM | POA: Diagnosis not present

## 2016-03-27 DIAGNOSIS — J954 Chemical pneumonitis due to anesthesia: Secondary | ICD-10-CM

## 2016-03-27 DIAGNOSIS — D631 Anemia in chronic kidney disease: Secondary | ICD-10-CM | POA: Diagnosis present

## 2016-03-27 DIAGNOSIS — R2689 Other abnormalities of gait and mobility: Secondary | ICD-10-CM | POA: Diagnosis not present

## 2016-03-27 DIAGNOSIS — B009 Herpesviral infection, unspecified: Secondary | ICD-10-CM | POA: Diagnosis present

## 2016-03-27 DIAGNOSIS — Z9079 Acquired absence of other genital organ(s): Secondary | ICD-10-CM

## 2016-03-27 DIAGNOSIS — F201 Disorganized schizophrenia: Secondary | ICD-10-CM

## 2016-03-27 DIAGNOSIS — Z833 Family history of diabetes mellitus: Secondary | ICD-10-CM

## 2016-03-27 DIAGNOSIS — R0600 Dyspnea, unspecified: Secondary | ICD-10-CM | POA: Diagnosis not present

## 2016-03-27 DIAGNOSIS — Z885 Allergy status to narcotic agent status: Secondary | ICD-10-CM

## 2016-03-27 DIAGNOSIS — R4182 Altered mental status, unspecified: Secondary | ICD-10-CM | POA: Diagnosis present

## 2016-03-27 DIAGNOSIS — G219 Secondary parkinsonism, unspecified: Secondary | ICD-10-CM | POA: Diagnosis present

## 2016-03-27 DIAGNOSIS — T50901A Poisoning by unspecified drugs, medicaments and biological substances, accidental (unintentional), initial encounter: Secondary | ICD-10-CM | POA: Diagnosis present

## 2016-03-27 DIAGNOSIS — R1112 Projectile vomiting: Secondary | ICD-10-CM | POA: Diagnosis not present

## 2016-03-27 DIAGNOSIS — R531 Weakness: Secondary | ICD-10-CM | POA: Diagnosis not present

## 2016-03-27 DIAGNOSIS — M79609 Pain in unspecified limb: Secondary | ICD-10-CM | POA: Diagnosis not present

## 2016-03-27 DIAGNOSIS — J189 Pneumonia, unspecified organism: Secondary | ICD-10-CM

## 2016-03-27 DIAGNOSIS — K529 Noninfective gastroenteritis and colitis, unspecified: Secondary | ICD-10-CM | POA: Diagnosis not present

## 2016-03-27 DIAGNOSIS — J690 Pneumonitis due to inhalation of food and vomit: Principal | ICD-10-CM

## 2016-03-27 DIAGNOSIS — I517 Cardiomegaly: Secondary | ICD-10-CM | POA: Diagnosis not present

## 2016-03-27 DIAGNOSIS — I5032 Chronic diastolic (congestive) heart failure: Secondary | ICD-10-CM | POA: Diagnosis present

## 2016-03-27 DIAGNOSIS — J9690 Respiratory failure, unspecified, unspecified whether with hypoxia or hypercapnia: Secondary | ICD-10-CM | POA: Diagnosis not present

## 2016-03-27 DIAGNOSIS — R140 Abdominal distension (gaseous): Secondary | ICD-10-CM | POA: Diagnosis not present

## 2016-03-27 DIAGNOSIS — F4310 Post-traumatic stress disorder, unspecified: Secondary | ICD-10-CM | POA: Diagnosis present

## 2016-03-27 DIAGNOSIS — E119 Type 2 diabetes mellitus without complications: Secondary | ICD-10-CM

## 2016-03-27 DIAGNOSIS — G9341 Metabolic encephalopathy: Secondary | ICD-10-CM | POA: Diagnosis not present

## 2016-03-27 DIAGNOSIS — N183 Chronic kidney disease, stage 3 (moderate): Secondary | ICD-10-CM | POA: Diagnosis not present

## 2016-03-27 DIAGNOSIS — R296 Repeated falls: Secondary | ICD-10-CM | POA: Diagnosis present

## 2016-03-27 DIAGNOSIS — Z79899 Other long term (current) drug therapy: Secondary | ICD-10-CM

## 2016-03-27 DIAGNOSIS — G9340 Encephalopathy, unspecified: Secondary | ICD-10-CM | POA: Diagnosis not present

## 2016-03-27 DIAGNOSIS — E118 Type 2 diabetes mellitus with unspecified complications: Secondary | ICD-10-CM | POA: Diagnosis not present

## 2016-03-27 DIAGNOSIS — Z923 Personal history of irradiation: Secondary | ICD-10-CM

## 2016-03-27 DIAGNOSIS — R0902 Hypoxemia: Secondary | ICD-10-CM | POA: Diagnosis not present

## 2016-03-27 DIAGNOSIS — Z8711 Personal history of peptic ulcer disease: Secondary | ICD-10-CM | POA: Diagnosis not present

## 2016-03-27 DIAGNOSIS — Z7982 Long term (current) use of aspirin: Secondary | ICD-10-CM

## 2016-03-27 DIAGNOSIS — Z87891 Personal history of nicotine dependence: Secondary | ICD-10-CM | POA: Diagnosis not present

## 2016-03-27 DIAGNOSIS — G894 Chronic pain syndrome: Secondary | ICD-10-CM

## 2016-03-27 DIAGNOSIS — E785 Hyperlipidemia, unspecified: Secondary | ICD-10-CM | POA: Diagnosis present

## 2016-03-27 DIAGNOSIS — Z794 Long term (current) use of insulin: Secondary | ICD-10-CM

## 2016-03-27 DIAGNOSIS — Z6841 Body Mass Index (BMI) 40.0 and over, adult: Secondary | ICD-10-CM

## 2016-03-27 DIAGNOSIS — R109 Unspecified abdominal pain: Secondary | ICD-10-CM | POA: Diagnosis not present

## 2016-03-27 DIAGNOSIS — M791 Myalgia: Secondary | ICD-10-CM | POA: Diagnosis present

## 2016-03-27 DIAGNOSIS — Z8249 Family history of ischemic heart disease and other diseases of the circulatory system: Secondary | ICD-10-CM

## 2016-03-27 DIAGNOSIS — Z808 Family history of malignant neoplasm of other organs or systems: Secondary | ICD-10-CM

## 2016-03-27 DIAGNOSIS — E039 Hypothyroidism, unspecified: Secondary | ICD-10-CM | POA: Diagnosis present

## 2016-03-27 DIAGNOSIS — G4733 Obstructive sleep apnea (adult) (pediatric): Secondary | ICD-10-CM | POA: Diagnosis present

## 2016-03-27 DIAGNOSIS — D638 Anemia in other chronic diseases classified elsewhere: Secondary | ICD-10-CM | POA: Diagnosis present

## 2016-03-27 DIAGNOSIS — Z853 Personal history of malignant neoplasm of breast: Secondary | ICD-10-CM

## 2016-03-27 DIAGNOSIS — Z96653 Presence of artificial knee joint, bilateral: Secondary | ICD-10-CM | POA: Diagnosis present

## 2016-03-27 DIAGNOSIS — F329 Major depressive disorder, single episode, unspecified: Secondary | ICD-10-CM | POA: Diagnosis present

## 2016-03-27 DIAGNOSIS — I130 Hypertensive heart and chronic kidney disease with heart failure and stage 1 through stage 4 chronic kidney disease, or unspecified chronic kidney disease: Secondary | ICD-10-CM | POA: Diagnosis not present

## 2016-03-27 DIAGNOSIS — Z91013 Allergy to seafood: Secondary | ICD-10-CM

## 2016-03-27 DIAGNOSIS — F200 Paranoid schizophrenia: Secondary | ICD-10-CM | POA: Diagnosis present

## 2016-03-27 DIAGNOSIS — S0990XA Unspecified injury of head, initial encounter: Secondary | ICD-10-CM | POA: Diagnosis not present

## 2016-03-27 DIAGNOSIS — R0603 Acute respiratory distress: Secondary | ICD-10-CM | POA: Diagnosis present

## 2016-03-27 DIAGNOSIS — R400 Somnolence: Secondary | ICD-10-CM | POA: Diagnosis not present

## 2016-03-27 LAB — URINALYSIS, ROUTINE W REFLEX MICROSCOPIC
Bilirubin Urine: NEGATIVE
Glucose, UA: NEGATIVE mg/dL
Hgb urine dipstick: NEGATIVE
Ketones, ur: NEGATIVE mg/dL
LEUKOCYTES UA: NEGATIVE
NITRITE: NEGATIVE
PH: 5 (ref 5.0–8.0)
Protein, ur: NEGATIVE mg/dL
SPECIFIC GRAVITY, URINE: 1.012 (ref 1.005–1.030)

## 2016-03-27 LAB — I-STAT CG4 LACTIC ACID, ED
Lactic Acid, Venous: 2.56 mmol/L (ref 0.5–1.9)
Lactic Acid, Venous: 1.16 mmol/L (ref 0.5–1.9)

## 2016-03-27 LAB — I-STAT ARTERIAL BLOOD GAS, ED
Acid-Base Excess: 4 mmol/L — ABNORMAL HIGH (ref 0.0–2.0)
BICARBONATE: 28.7 mmol/L — AB (ref 20.0–28.0)
O2 Saturation: 96 %
PCO2 ART: 45.6 mmHg (ref 32.0–48.0)
PH ART: 7.409 (ref 7.350–7.450)
TCO2: 30 mmol/L (ref 0–100)
pO2, Arterial: 84 mmHg (ref 83.0–108.0)

## 2016-03-27 LAB — CBC WITH DIFFERENTIAL/PLATELET
BASOS ABS: 0 10*3/uL (ref 0.0–0.1)
BASOS PCT: 0 %
EOS ABS: 0.4 10*3/uL (ref 0.0–0.7)
EOS PCT: 3 %
HCT: 24.8 % — ABNORMAL LOW (ref 36.0–46.0)
Hemoglobin: 7.8 g/dL — ABNORMAL LOW (ref 12.0–15.0)
Lymphocytes Relative: 11 %
Lymphs Abs: 1.6 10*3/uL (ref 0.7–4.0)
MCH: 26 pg (ref 26.0–34.0)
MCHC: 31.5 g/dL (ref 30.0–36.0)
MCV: 82.7 fL (ref 78.0–100.0)
MONO ABS: 0.6 10*3/uL (ref 0.1–1.0)
Monocytes Relative: 4 %
Neutro Abs: 12.2 10*3/uL — ABNORMAL HIGH (ref 1.7–7.7)
Neutrophils Relative %: 82 %
PLATELETS: 252 10*3/uL (ref 150–400)
RBC: 3 MIL/uL — ABNORMAL LOW (ref 3.87–5.11)
RDW: 17.3 % — AB (ref 11.5–15.5)
WBC: 14.8 10*3/uL — ABNORMAL HIGH (ref 4.0–10.5)

## 2016-03-27 LAB — COMPREHENSIVE METABOLIC PANEL
ALBUMIN: 3.1 g/dL — AB (ref 3.5–5.0)
ALK PHOS: 57 U/L (ref 38–126)
ALT: 18 U/L (ref 14–54)
ANION GAP: 12 (ref 5–15)
AST: 32 U/L (ref 15–41)
BILIRUBIN TOTAL: 0.8 mg/dL (ref 0.3–1.2)
BUN: 44 mg/dL — AB (ref 6–20)
CALCIUM: 8.7 mg/dL — AB (ref 8.9–10.3)
CO2: 26 mmol/L (ref 22–32)
Chloride: 93 mmol/L — ABNORMAL LOW (ref 101–111)
Creatinine, Ser: 2.38 mg/dL — ABNORMAL HIGH (ref 0.44–1.00)
GFR calc Af Amer: 25 mL/min — ABNORMAL LOW (ref >60)
GFR calc non Af Amer: 21 mL/min — ABNORMAL LOW (ref >60)
GLUCOSE: 123 mg/dL — AB (ref 65–99)
Potassium: 4.7 mmol/L (ref 3.5–5.1)
Sodium: 131 mmol/L — ABNORMAL LOW (ref 135–145)
TOTAL PROTEIN: 5.7 g/dL — AB (ref 6.5–8.1)

## 2016-03-27 LAB — I-STAT TROPONIN, ED: Troponin i, poc: 0 ng/mL (ref 0.00–0.08)

## 2016-03-27 LAB — I-STAT CHEM 8, ED
BUN: 55 mg/dL — ABNORMAL HIGH (ref 6–20)
Calcium, Ion: 1.06 mmol/L — ABNORMAL LOW (ref 1.15–1.40)
Chloride: 94 mmol/L — ABNORMAL LOW (ref 101–111)
Creatinine, Ser: 2.4 mg/dL — ABNORMAL HIGH (ref 0.44–1.00)
Glucose, Bld: 115 mg/dL — ABNORMAL HIGH (ref 65–99)
HCT: 23 % — ABNORMAL LOW (ref 36.0–46.0)
Hemoglobin: 7.8 g/dL — ABNORMAL LOW (ref 12.0–15.0)
POTASSIUM: 4.8 mmol/L (ref 3.5–5.1)
SODIUM: 131 mmol/L — AB (ref 135–145)
TCO2: 28 mmol/L (ref 0–100)

## 2016-03-27 LAB — INFLUENZA PANEL BY PCR (TYPE A & B)
INFLAPCR: NEGATIVE
INFLBPCR: NEGATIVE

## 2016-03-27 MED ORDER — HEPARIN BOLUS VIA INFUSION
4000.0000 [IU] | Freq: Once | INTRAVENOUS | Status: AC
  Administered 2016-03-28: 4000 [IU] via INTRAVENOUS
  Filled 2016-03-27: qty 4000

## 2016-03-27 MED ORDER — ACETAMINOPHEN 500 MG PO TABS
1000.0000 mg | ORAL_TABLET | Freq: Once | ORAL | Status: AC
  Administered 2016-03-27: 1000 mg via ORAL
  Filled 2016-03-27: qty 2

## 2016-03-27 MED ORDER — VANCOMYCIN HCL 10 G IV SOLR
2500.0000 mg | Freq: Once | INTRAVENOUS | Status: AC
  Administered 2016-03-27: 2500 mg via INTRAVENOUS
  Filled 2016-03-27: qty 2500

## 2016-03-27 MED ORDER — HEPARIN (PORCINE) IN NACL 100-0.45 UNIT/ML-% IJ SOLN
2000.0000 [IU]/h | INTRAMUSCULAR | Status: DC
  Administered 2016-03-28: 1450 [IU]/h via INTRAVENOUS
  Administered 2016-03-28: 1900 [IU]/h via INTRAVENOUS
  Filled 2016-03-27 (×4): qty 250

## 2016-03-27 MED ORDER — VANCOMYCIN HCL 10 G IV SOLR: 20.0000 mg/kg | Freq: Once | INTRAVENOUS | Status: DC

## 2016-03-27 MED ORDER — PIPERACILLIN-TAZOBACTAM 3.375 G IVPB 30 MIN
3.3750 g | Freq: Once | INTRAVENOUS | Status: AC
  Administered 2016-03-27: 3.375 g via INTRAVENOUS
  Filled 2016-03-27: qty 50

## 2016-03-27 NOTE — H&P (Signed)
History and Physical    [name] WRU:[id number] DOB: 04/15/1955 DOA: 03/27/2016  PCP: [name], MD Consultants:  None Patient coming from: home  Chief Complaint: AMS  HPI: [name] is a 61 y.o. female with medical history significant of chronic kidney disease stage III, hypertension, hyperlipidemia, obesity, obstructive sleep apnea, she was recently hospitalized here for community-acquired pneumonia presenting with AMS.  She is unable to answer any questions and is unaccompanied.    HPI for Dr. Tyrone Nine:    [name] with a chief complaint of altered mental status. Family said that they've been acting more confused today. Having some mild cough and side pain. Patient is currently more alert than she had been earlier today. States that she has no issues with her side currently but has been coughing. Having some diffuse myalgias worse to the legs. Family denies any recent medication changes. Has been having multiple falls over the past couple days.  She was previously hospitalized: -1/12-15/18 for sepsis due to HCAP, acute hypoxic respiratory failure, slurred speech, and  AKI on CKD. -12/27-31/17 for sepsis due to multifocal PNA with mild AKI, metabolic encephalopathy; acute on chronic diastolic CHF exacerbation  ED Course: Per Dr. Tyrone Nine: [name] with multiple falls over past couple of days. Patient also has had some transient confusion. Was just in the hospital with hospital acquired pneumonia. We'll repeat a chest x-ray obtain a UA. Check basic labs. CT the head with multiple falls recently as well. With generalized weakness will check a troponin. The patient had a reported blood pressure in the field and in the 70s. Here her blood pressure has been in the 914N systolic. Will give a liter of fluids. Send off for cultures.  Patient continues to have a soft blood pressure. Lactate very minimally elevated. Chest x-ray concerning for possible pneumonia. Patient also has an acute  kidney injury. We'll give more fluids. Broadly cover with antibiotics as patient was just in the hospital. Discussed with hospitalist   Review of Systems: Unable to obtain    Past Medical History:  Diagnosis Date  . Anemia   . Anxiety   . Arthritis    "pretty much all my joints"  . Asthma   . Benign paroxysmal positional vertigo 12/30/2012  . Breast cancer ([place]) 02/13/12   ruq  100'clock bx Ductal Carcinoma in Situ,(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  . Breast mass in female    L breast 2008, US showed likely fat necrosis  . Chronic lower back pain   . CKD (chronic kidney disease), stage III    "lower stage" (01/06/2014)  . Daily headache   . Depression   . GERD (gastroesophageal reflux disease)   . History of blood transfusion 2011   "after one of my OR's"  . History of hiatal hernia   . History of stomach ulcers   . HSV (herpes simplex virus) infection   . Hx of radiation therapy 05/05/12- 07/15/12   right breast, 45 gray x 25 fx, lumpectomy cavity boosted to 16.2 gray  . Hyperlipemia   . Hypertension    sees Dr. Criss Rosales , [facility]  . Hypothyroidism   . Kidney stones   . Knee pain, bilateral   . Migraines    "~ 3 times/month" (01/06/2014)  . Obesity   . OSA on CPAP    pt does not know settings  . Pancreatitis    hx of  . Pneumonia 1950's  . Polyneuropathy in diabetes(357.2)   .  Schizophrenia ([place])   . 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 Tachycardia    with sx in 2008  . Thyroid disease   . Type II diabetes mellitus ([place])     Past Surgical History:  Procedure Laterality Date  . ABDOMINAL HYSTERECTOMY  1979?   partial  . BREAST LUMPECTOMY  03/03/2012   Procedure: LUMPECTOMY;  Surgeon: Haywood Lasso, MD;  Location: [facility];  Service: General;  Laterality: Right;  . BREAST SURGERY Right 01/2012   "cancer"  . CHOLECYSTECTOMY  1980's?  . COLONOSCOPY WITH PROPOFOL N/A 09/28/2012   Procedure: COLONOSCOPY WITH PROPOFOL;  Surgeon: Lear Ng, MD;   Location: [facility];  Service: Endoscopy;  Laterality: N/A;  . FOOT FRACTURE SURGERY Right 1990's  . JOINT REPLACEMENT    . REVISION TOTAL KNEE ARTHROPLASTY  07/2009  . SHOULDER OPEN ROTATOR CUFF REPAIR Left 1990's  . THYROIDECTOMY  1970's  . TOTAL KNEE ARTHROPLASTY Bilateral 2009-06/2009   left; right    Social History   Social History  . Marital status: Divorced    Spouse name: N/A  . Number of children: 2  . Years of education: 13   Occupational History  . Disabled    Social History Main Topics  . Smoking status: Former Smoker    Packs/day: 1.00    Years: 7.00    Types: Cigarettes    Quit date: 03/24/1992  . Smokeless tobacco: Never Used  . 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 Drug use: No  . Sexual activity: No   Other Topics Concern  . Not on file   Social History Narrative   Single. Disabled Biochemist, clinical.       Patient does not drink caffeine.   Patient is right handed.     Allergies  Allergen Reactions  . Bee Venom Anaphylaxis  . Shellfish Allergy Anaphylaxis  . Buprenorphine Hcl Other (See Comments)    Overly sedated  . Morphine And Related Other (See Comments)    Overly sedated    Family History  Problem Relation Age of Onset  . Heart disease Brother     Multiple MIs, starting in his 64s  . Diabetes Brother   . Hypertension Mother   . Diabetes Mother   . Breast cancer Mother 58  . Bone cancer Mother   . Hypertension Sister   . Diabetes Sister   . Breast cancer Sister 46  . Breast cancer Maternal Grandmother   . Heart disease Maternal Grandmother   . Uterine cancer Other 19  . Breast cancer Paternal Aunt 62  . Breast cancer Paternal Grandmother     dx in her 56s  . Prostate cancer Paternal Grandfather     Prior to Admission medications   Medication Sig Start Date End Date Taking? Authorizing Provider  albuterol (PROVENTIL) (2.5 MG/3ML) 0.083% nebulizer solution Inhale 3 mLs into the lungs  every 6 (six) hours as needed. For shortness of breath 11/03/15   Historical Provider, MD  aspirin EC 81 MG tablet Take 1 tablet (81 mg total) by mouth daily. 08/12/13   Sheila Oats, MD  B Complex-C (B-COMPLEX WITH VITAMIN C) tablet Take 1 tablet by mouth daily.    Historical Provider, MD  carvedilol (COREG) 3.125 MG tablet Take 1 tablet (3.125 mg total) by mouth 2 (two) times daily with a meal. 02/17/16   Florencia Reasons, MD  Cholecalciferol (VITAMIN D-3) 1000 units CAPS Take 1,000 Units  by mouth daily.    Historical Provider, MD  doxepin (SINEQUAN) 25 MG capsule Take 50 mg by mouth at bedtime.    Historical Provider, MD  esomeprazole (NEXIUM) 40 MG capsule Take 1 capsule (40 mg total) by mouth daily before breakfast. Patient taking differently: Take 40 mg by mouth 2 (two) times daily before a meal.  10/30/14   Barton Dubois, MD  ferrous sulfate 325 (65 FE) MG EC tablet Take 1 tablet (325 mg total) by mouth daily with breakfast. 02/17/16   Florencia Reasons, MD  furosemide (LASIX) 40 MG tablet Take 1 tablet (40 mg total) by mouth daily. 02/17/16   Florencia Reasons, MD  guaiFENesin (MUCINEX) 600 MG 12 hr tablet Take 1 tablet (600 mg total) by mouth 2 (two) times daily. 02/17/16   Florencia Reasons, MD  Insulin Detemir (LEVEMIR FLEXTOUCH) 100 UNIT/ML Pen Inject 10 Units into the skin at bedtime.    Historical Provider, MD  insulin lispro (HUMALOG KWIKPEN) 100 UNIT/ML KiwkPen Inject 10 Units into the skin 3 (three) times daily.    Historical Provider, MD  levofloxacin (LEVAQUIN) 750 MG tablet Take 1 tablet (750 mg total) by mouth daily. 03/03/16   Costin Karlyne Greenspan, MD  levothyroxine (SYNTHROID, LEVOTHROID) 150 MCG tablet Take 1 tablet (150 mcg total) by mouth daily before breakfast. 02/18/16   Florencia Reasons, MD  LORazepam (ATIVAN) 2 MG tablet Take 1 tablet (2 mg total) by mouth every 12 (twelve) hours as needed for anxiety. Patient taking differently: Take 1-2 mg by mouth 2 (two) times daily. Take 1/2 tablet (1 mg) by mouth every morning and 1  tablet (2 mg) at bedtime 11/29/14   Gildardo Cranker, DO  LYRICA 100 MG capsule Take 100 mg by mouth 2 (two) times daily. 10/31/15   Historical Provider, MD  oxyCODONE (ROXICODONE) 15 MG immediate release tablet Take 1 tablet (15 mg total) by mouth every 6 (six) hours as needed for pain. Patient taking differently: Take 15 mg by mouth every 4 (four) hours.  11/29/14   Gildardo Cranker, DO  polyethylene glycol (MIRALAX / GLYCOLAX) packet Take 17 g by mouth daily. 02/18/16   Florencia Reasons, MD  senna-docusate (SENOKOT-S) 8.6-50 MG tablet Take 2 tablets by mouth at bedtime. 02/17/16   Florencia Reasons, MD  sitaGLIPtin-metformin (JANUMET) 50-500 MG per tablet Take 1 tablet by mouth 2 (two) times daily with a meal.    Historical Provider, MD  Tapentadol HCl (NUCYNTA ER) 250 MG TB12 Take 250 mg by mouth every 12 (twelve) hours.    Historical Provider, MD  tiZANidine (ZANAFLEX) 2 MG tablet Take 2 mg by mouth 2 (two) times daily.    Historical Provider, MD  valACYclovir (VALTREX) 1000 MG tablet Take 1,000 mg by mouth 2 (two) times daily.     Historical Provider, MD  valsartan (DIOVAN) 40 MG tablet Take 1 tablet (40 mg total) by mouth daily. 02/17/16   Florencia Reasons, MD  venlafaxine XR (EFFEXOR-XR) 150 MG 24 hr capsule Take 150 mg by mouth 2 (two) times daily. 05/21/15   Historical Provider, MD  ziprasidone (GEODON) 40 MG capsule Take 40 mg by mouth daily after supper. 05/21/15   Historical Provider, MD    Physical Exam: Vitals:   03/28/16 0100 03/28/16 0130 03/28/16 0200 03/28/16 0230  BP: (!) 95/45 (!) 97/51 (!) 96/54 (!) 97/51  Pulse: 95 94 93 93  Resp: (!) 6 16 (!) 9 13  Temp:      TempSrc:      SpO2:  96% 96% 94% 96%  Weight:         General: Super morbidly obese, obtunded, grunts a response to many questions, few actual formed words Eyes:  PERRL, EOMI, normal lids, iris ENT:  grossly normal hearing, lips & tongue, mmm Neck:  no LAD, masses or thyromegaly Cardiovascular:  RRR, no m/r/g. No LE edema.  Respiratory:  CTA  bilaterally, no w/r/r. Normal respiratory effort. Abdomen:  soft, ntnd, NABS Skin:  no rash or induration seen on limited exam Musculoskeletal:  grossly normal tone BUE/BLE, good ROM, no bony abnormality Psychiatric:  Obtunded,minimally responsive Neurologic:  Unable to perform  Labs on Admission: I have personally reviewed following labs and imaging studies  CBC:  Recent Labs Lab 03/27/16 1850 03/27/16 1933  WBC 14.8*  --   NEUTROABS 12.2*  --   HGB 7.8* 7.8*  HCT 24.8* 23.0*  MCV 82.7  --   PLT 252  --    Basic Metabolic Panel:  Recent Labs Lab 03/27/16 1850 03/27/16 1933  NA 131* 131*  K 4.7 4.8  CL 93* 94*  CO2 26  --   GLUCOSE 123* 115*  BUN 44* 55*  CREATININE 2.38* 2.40*  CALCIUM 8.7*  --    GFR: Estimated Creatinine Clearance: 38.1 mL/min (by C-G formula based on SCr of 2.4 mg/dL (H)). Liver Function Tests:  Recent Labs Lab 03/27/16 1850  AST 32  ALT 18  ALKPHOS 57  BILITOT 0.8  PROT 5.7*  ALBUMIN 3.1*   No results for input(s): LIPASE, AMYLASE in the last 168 hours. No results for input(s): AMMONIA in the last 168 hours. Coagulation Profile: No results for input(s): INR, PROTIME in the last 168 hours. Cardiac Enzymes: No results for input(s): CKTOTAL, CKMB, CKMBINDEX, TROPONINI in the last 168 hours. BNP (last 3 results) No results for input(s): PROBNP in the last 8760 hours. HbA1C: No results for input(s): HGBA1C in the last 72 hours. CBG: No results for input(s): GLUCAP in the last 168 hours. Lipid Profile: No results for input(s): CHOL, HDL, LDLCALC, TRIG, CHOLHDL, LDLDIRECT in the last 72 hours. Thyroid Function Tests: No results for input(s): TSH, T4TOTAL, FREET4, T3FREE, THYROIDAB in the last 72 hours. Anemia Panel: No results for input(s): VITAMINB12, FOLATE, FERRITIN, TIBC, IRON, RETICCTPCT in the last 72 hours. Urine analysis:    Component Value Date/Time   COLORURINE YELLOW 03/27/2016 2020   APPEARANCEUR CLEAR 03/27/2016 2020    APPEARANCEUR Clear 11/13/2013 1310   LABSPEC 1.012 03/27/2016 2020   LABSPEC 1.017 11/13/2013 1310   PHURINE 5.0 03/27/2016 2020   GLUCOSEU NEGATIVE 03/27/2016 2020   GLUCOSEU Negative 11/13/2013 1310   GLUCOSEU NEG mg/dL 03/02/2006 2050   HGBUR NEGATIVE 03/27/2016 2020   BILIRUBINUR NEGATIVE 03/27/2016 2020   BILIRUBINUR Negative 11/13/2013 [place] 03/27/2016 2020   PROTEINUR NEGATIVE 03/27/2016 2020   UROBILINOGEN 0.2 11/12/2014 2057   NITRITE NEGATIVE 03/27/2016 2020   LEUKOCYTESUR NEGATIVE 03/27/2016 2020   LEUKOCYTESUR Trace 11/13/2013 1310    Creatinine Clearance: Estimated Creatinine Clearance: 38.1 mL/min (by C-G formula based on SCr of 2.4 mg/dL (H)).  Sepsis Labs: _0 (procalcitonin:4,lacticidven:4) )No results found for this or any previous visit (from the past 240 hour(s)).   Radiological Exams on Admission: Ct Abdomen Pelvis Wo Contrast  Result Date: 03/28/2016 CLINICAL DATA:  Right flank pain EXAM: CT ABDOMEN AND PELVIS WITHOUT CONTRAST TECHNIQUE: Multidetector CT imaging of the abdomen and pelvis was performed following the standard protocol without IV contrast. COMPARISON:  06/23/2015 FINDINGS: Lower  chest: Mild ground-glass densities in the left greater than right posterior lower lobes. No effusion. The heart appears borderline enlarged. Hepatobiliary: No focal liver abnormality is seen. Status post cholecystectomy. No biliary dilatation. Pancreas: Unremarkable. No pancreatic ductal dilatation or surrounding inflammatory changes. Spleen: Normal in size without focal abnormality. Adrenals/Urinary Tract: Adrenal glands are unremarkable. Kidneys are normal, without renal calculi, focal lesion, or hydronephrosis. Bladder is unremarkable. Nonspecific perinephric fat stranding. Stomach/Bowel: Stomach is within normal limits. Appendix contains a few stones but is otherwise normal. No evidence of bowel wall thickening, distention, or inflammatory  changes. Large amount of stool in the right and transverse colon. Vascular/Lymphatic: Aortic atherosclerosis. No enlarged abdominal or pelvic lymph nodes. Reproductive: Status post hysterectomy. No adnexal masses. Other: Small fat containing umbilical hernia. No free air or free fluid. Musculoskeletal: Degenerative changes. No acute or suspicious bone lesions. IMPRESSION: 1. Negative for nephrolithiasis hydronephrosis or ureteral stone. 2. Mild density in the left greater than right posterior lower lobes could reflect pneumonia or possible aspiration. Electronically Signed   By: Donavan Foil M.D.   On: 03/28/2016 01:13   Dg Chest 2 View  Result Date: 03/27/2016 CLINICAL DATA:  Sepsis EXAM: CHEST  2 VIEW COMPARISON:  02/29/2016 FINDINGS: Low lung volumes. Borderline cardiomegaly. No aortic aneurysm. Mild bilateral interstitial edema. Possible superimposed pneumonia at each lung base left greater than right given slightly more confluent airspace opacities noted. Surgical clips are seen at the base the neck as before. No sizable effusion. No pneumothorax identified. No acute osseous abnormality. Small calcification about the right shoulder may reflect calcific tendinopathy. IMPRESSION: Low lung volumes with borderline cardiomegaly and interstitial edema. Subtle airspace opacities at the lung bases left greater than right cannot exclude superimposed pneumonia. Electronically Signed   By: Ashley Royalty M.D.   On: 03/27/2016 18:11   Ct Head Wo Contrast  Result Date: 03/27/2016 CLINICAL DATA:  Multiple falls. EXAM: CT HEAD WITHOUT CONTRAST TECHNIQUE: Contiguous axial images were obtained from the base of the skull through the vertex without intravenous contrast. COMPARISON:  02/29/2016 FINDINGS: Brain: There is no evidence for acute hemorrhage, hydrocephalus, mass lesion, or abnormal extra-axial fluid collection. No definite CT evidence for acute infarction. Vascular: No hyperdense vessel or unexpected calcification.  Skull: Normal. Negative for fracture or focal lesion. Sinuses/Orbits: Visualized portions of the paranasal sinuses are clear. Right mastoid air cells are clear. Fluid is visible in the left mastoid air cells. Visualized portions of the globes and intraorbital fat are unremarkable. Other: None. IMPRESSION: 1. No acute intracranial abnormality. 2. Left mastoid effusion. Electronically Signed   By: Misty Stanley M.D.   On: 03/27/2016 17:50    EKG: Independently reviewed.  NSR with rate 95; nonspecific ST changes with no evidence of acute ischemia, NSCSLT  Assessment/Plan Principal Problem:   Encephalopathy Active Problems:   Hypothyroidism   Anemia   Diabetes mellitus, type II, insulin dependent (HCC)   HCAP (healthcare-associated pneumonia)   Acute renal failure (ARF) (HCC)   Encephalopathy -Differential is vast and includes infection (has HCAP, see below); PE; CVA; overdose; renal failure; metabolic encephalopathy. -CT head negative -For now, will attempt to correct known problems and continue to follow -Lactate 2.56, 1.16, 1.1 -UA WNL -Na 131, prior 138 on 1/14 -Glucose 123, 115 -There was some concern about medications that the patient took; will hold sedating medications as much as possible.  HCAP -Given recent hospitalizations for CAP/HCAP in conjunction with abnormal CXR findings, will treat for HCAP. -Will start Cefepime and Vancomycin. -NS @  75cc/hr -Fever control -Repeat CBC in am -Sputum cultures -Blood cultures -Strep pneumo testing -ABG - 7.409/45.6/84.0/28.7 -Influenza negative -WBC 14.8 -With multiple episodes of PNA in the last 6 weeks, underlying lesion is of some concern as well as PE. -Attempted to obtain CTA but unable to do so due to creatinine. -Will order LE [place]. -For now, will cover with Heparin drip. -ABG is somewhat reassuring; if DVT US is negative, would consider stopping Heparin infusion.  ARF -Creatinine 2.40, 1.07 on 1/14 -Rehydrate and  follow -Suspect that elevated lactate was related to this issue  Anemia -Hgb 7.8, 8.7 on 1/14 -Will follow  Hypothyroidism -Low TSH in December -Will recheck TSH and free T4 at this time  DM -A1c 7.3 in May 2017, 6.1 in 12/17 -Will not recheck -Hold long-acting insulin for now, cover with SSI    DVT prophylaxis: Heparin drip Code Status:  Full  Family Communication: None present Disposition Plan:  Home once clinically improved Consults called: None  Admission status: Admit - It is my clinical opinion that admission to [facility] is reasonable and necessary because this patient will require at least 2 midnights in the hospital to treat this condition based on the medical complexity of the problems presented.  Given the aforementioned information, the predictability of an adverse outcome is felt to be significant.    Karmen Bongo MD [facility]  If 7PM-7AM, please contact night-coverage [email or web address] Password [email or web address]  03/28/2016, 3:24 AM

## 2016-03-27 NOTE — Progress Notes (Signed)
ANTICOAGULATION CONSULT NOTE - Initial Consult  Pharmacy Consult for heparin Indication: VTE  Allergies  Allergen Reactions  . Bee Venom Anaphylaxis  . Shellfish Allergy Anaphylaxis  . Buprenorphine Hcl Other (See Comments)    Overly sedated  . Morphine And Related Other (See Comments)    Overly sedated    Patient Measurements: Weight: (!) 339 lb (153.8 kg) Heparin Dosing Weight: 95 kg  Vital Signs: Temp: 99.5 F (37.5 C) (02/08 1626) Temp Source: Oral (02/08 1626) BP: 110/53 (02/08 2145) Pulse Rate: 96 (02/08 2145)  Labs:  Recent Labs  03/27/16 1850 03/27/16 1933  HGB 7.8* 7.8*  HCT 24.8* 23.0*  PLT 252  --   CREATININE 2.38* 2.40*    Estimated Creatinine Clearance: 38.1 mL/min (by C-G formula based on SCr of 2.4 mg/dL (H)).   Medical History: Past Medical History:  Diagnosis Date  . Anemia   . Anxiety   . Arthritis    "pretty much all my joints"  . Asthma   . Benign paroxysmal positional vertigo 12/30/2012  . Breast cancer ([place]) 02/13/12   ruq  100'clock bx Ductal Carcinoma in Situ,(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  . Breast mass in female    L breast 2008, US showed likely fat necrosis  . Chronic lower back pain   . CKD (chronic kidney disease), stage III    "lower stage" (01/06/2014)  . Daily headache   . Depression   . GERD (gastroesophageal reflux disease)   . History of blood transfusion 2011   "after one of my OR's"  . History of hiatal hernia   . History of stomach ulcers   . HSV (herpes simplex virus) infection   . Hx of radiation therapy 05/05/12- 07/15/12   right breast, 45 gray x 25 fx, lumpectomy cavity boosted to 16.2 gray  . Hyperlipemia   . Hypertension    sees Dr. Criss Rosales , [facility] [place]  . Hypothyroidism   . Kidney stones   . Knee pain, bilateral   . Migraines    "~ 3 times/month" (01/06/2014)  . Obesity   . OSA on CPAP    pt does not know settings  . Pancreatitis    hx of  . Pneumonia 1950's  . Polyneuropathy in diabetes(357.2)   .  Schizophrenia ([place])   . 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 Tachycardia    with sx in 2008  . Thyroid disease   . Type II diabetes mellitus (HCC)      Assessment: Starting heparin gtt for rule out VTE Planning to do vascular study tomorrow to look for lower extremity thrombosis.  Pt was not taking anticoagulants prior to admission Hgb 7.8, plts wnl   Goal of Therapy:  Heparin level 0.3-0.7 units/ml Monitor platelets by anticoagulation protocol: Yes    Plan:  -Heparin bolus 4000 units then infuse at 1500 units/hr -Daily HL, CBC -F/u vascular studies   [name] 03/27/2016,9:59 PM

## 2016-03-27 NOTE — ED Provider Notes (Signed)
[facility] DEPT Provider Note   CSN: [id number] Arrival date & time: 03/27/16  1603     History   Chief Complaint Chief Complaint  Patient presents with  . Hypotension    HPI [name] is a 61 y.o. female.  [name] with a chief complaint of altered mental status. Family said that they've been acting more confused today. Having some mild cough and side pain. Patient is currently more alert than she had been earlier today. States that she has no issues with her side currently but has been coughing. Having some diffuse myalgias worse to the legs. Family denies any recent medication changes. Has been having multiple falls over the past couple days.   The history is provided by the patient, the EMS personnel and the nursing home.  Illness  This is a recurrent problem. The current episode started 6 to 12 hours ago. The problem occurs constantly. The problem has been gradually improving. Pertinent negatives include no chest pain, no headaches and no shortness of breath. Nothing aggravates the symptoms. Nothing relieves the symptoms. She has tried nothing for the symptoms. The treatment provided no relief.    Past Medical History:  Diagnosis Date  . Anemia   . Anxiety   . Arthritis    "pretty much all my joints"  . Asthma   . Benign paroxysmal positional vertigo 12/30/2012  . Breast cancer ([place]) 02/13/12   ruq  100'clock bx Ductal Carcinoma in Situ,(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  . Breast mass in female    L breast 2008, US showed likely fat necrosis  . Chronic lower back pain   . CKD (chronic kidney disease), stage III    "lower stage" (01/06/2014)  . Daily headache   . Depression   . GERD (gastroesophageal reflux disease)   . History of blood transfusion 2011   "after one of my OR's"  . History of hiatal hernia   . History of stomach ulcers   . HSV (herpes simplex virus) infection   . Hx of radiation therapy 05/05/12- 07/15/12   right breast, 45 gray x 25 fx, lumpectomy  cavity boosted to 16.2 gray  . Hyperlipemia   . Hypertension    sees Dr. Criss Rosales , [facility]  . Hypothyroidism   . Kidney stones   . Knee pain, bilateral   . Migraines    "~ 3 times/month" (01/06/2014)  . Obesity   . OSA on CPAP    pt does not know settings  . Pancreatitis    hx of  . Pneumonia 1950's  . Polyneuropathy in diabetes(357.2)   . Schizophrenia ([place])   . 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 Tachycardia    with sx in 2008  . Thyroid disease   . Type II diabetes mellitus [facility])     Patient Active Problem List   Diagnosis Date Noted  . Encephalopathy 03/27/2016  . Acute kidney injury ([place]) 02/29/2016  . HCAP (healthcare-associated pneumonia) 02/29/2016  . Pneumonia 02/14/2016  . CAP (community acquired pneumonia) 02/13/2016  . Edema 02/13/2016  . AKI (acute kidney injury) ([place]) 02/13/2016  . Elevated troponin 06/24/2015  . Diarrhea 06/23/2015  . Sepsis ([place]) 06/23/2015  . Community acquired pneumonia 06/23/2015  . Obesity, morbid, BMI 50 or higher ([place]) 03/26/2015  . Post traumatic stress disorder (PTSD) 11/14/2014  . Acute encephalopathy 11/12/2014  . Leukocytosis 11/12/2014  . Altered mental status 11/12/2014  . Slurred speech 11/12/2014  . CKD (chronic kidney disease) stage 3, GFR 30-59  ml/min   . Diabetes mellitus, type II, insulin dependent ([place])   . TIA (transient ischemic attack)   . Hypotension 10/28/2014  . Syncope 10/28/2014  . Anemia 10/28/2014  . Type II diabetes mellitus ([place]) 10/28/2014  . OSA on CPAP 10/28/2014  . Chronic lower back pain 10/28/2014  . Polyneuropathy in diabetes(357.2) 10/28/2014  . Diabetic polyneuropathy ([place])   . Fall 08/14/2014  . Recurrent falls 08/14/2014  . Hot flashes 02/24/2014  . Arthralgia 02/24/2014  . Hair loss 02/24/2014  . Chest pain 01/06/2014  . Chronic back pain 01/06/2014  . Chest discomfort 09/02/2013  . Shock ([place]) 08/08/2013  . 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 Dysarthria  12/30/2012  . Benign paroxysmal positional vertigo 12/30/2012  . Breast cancer of upper-outer quadrant of right female breast ([place]) 11/18/2012  . Hemorrhage of rectum and anus 09/28/2012  . Hx of radiation therapy   . Syncope and collapse 06/04/2011  . HSV 10/05/2008  . Asthma 06/02/2008  . HLD (hyperlipidemia) 06/02/2006  . Sinus tachycardia 05/25/2006  . Hypothyroidism 02/27/2006  . Obesity 02/27/2006  . Depression 02/27/2006  . Essential hypertension 02/27/2006  . GERD 02/27/2006  . KNEE PAIN 02/27/2006    Past Surgical History:  Procedure Laterality Date  . ABDOMINAL HYSTERECTOMY  1979?   partial  . BREAST LUMPECTOMY  03/03/2012   Procedure: LUMPECTOMY;  Surgeon: Haywood Lasso, MD;  Location: [facility];  Service: General;  Laterality: Right;  . BREAST SURGERY Right 01/2012   "cancer"  . CHOLECYSTECTOMY  1980's?  . COLONOSCOPY WITH PROPOFOL N/A 09/28/2012   Procedure: COLONOSCOPY WITH PROPOFOL;  Surgeon: Lear Ng, MD;  Location: [facility];  Service: Endoscopy;  Laterality: N/A;  . FOOT FRACTURE SURGERY Right 1990's  . JOINT REPLACEMENT    . REVISION TOTAL KNEE ARTHROPLASTY  07/2009  . SHOULDER OPEN ROTATOR CUFF REPAIR Left 1990's  . THYROIDECTOMY  1970's  . TOTAL KNEE ARTHROPLASTY Bilateral 2009-06/2009   left; right    OB History    No data available       Home Medications    Prior to Admission medications   Medication Sig Start Date End Date Taking? Authorizing Provider  albuterol (PROAIR HFA) 108 (90 Base) MCG/ACT inhaler Inhale 2 puffs into the lungs See admin instructions. Every 4-6 hours as needed for wheezing or shortness of breath   Yes Historical Provider, MD  docusate sodium (COLACE) 100 MG capsule Take 200 mg by mouth at bedtime.   Yes Historical Provider, MD  gabapentin (NEURONTIN) 100 MG capsule Take 300 mg by mouth 3 (three) times daily.   Yes Historical Provider, MD  lidocaine (XYLOCAINE) 2 % solution 10 mLs See admin instructions. Swish  and swallow every 3 hours   Yes Historical Provider, MD  linaclotide (LINZESS) 290 MCG CAPS capsule Take 290 mcg by mouth daily before breakfast.   Yes Historical Provider, MD  Multiple Vitamins-Minerals (ONE-A-DAY WOMENS 50+ ADVANTAGE) TABS Take 1 tablet by mouth daily.   Yes Historical Provider, MD  pregabalin (LYRICA) 150 MG capsule Take 150 mg by mouth 2 (two) times daily.   Yes Historical Provider, MD  rosuvastatin (CRESTOR) 20 MG tablet Take 20 mg by mouth daily.   Yes Historical Provider, MD  valsartan-hydrochlorothiazide (DIOVAN-HCT) 160-12.5 MG tablet Take 1 tablet by mouth daily.   Yes Historical Provider, MD  albuterol (PROVENTIL) (2.5 MG/3ML) 0.083% nebulizer solution Inhale 3 mLs into the lungs every 6 (six) hours as needed. For shortness of breath 11/03/15  Historical Provider, MD  aspirin EC 81 MG tablet Take 1 tablet (81 mg total) by mouth daily. 08/12/13   Sheila Oats, MD  B Complex-C (B-COMPLEX WITH VITAMIN C) tablet Take 1 tablet by mouth daily.    Historical Provider, MD  carvedilol (COREG) 3.125 MG tablet Take 1 tablet (3.125 mg total) by mouth 2 (two) times daily with a meal. 02/17/16   Florencia Reasons, MD  Cholecalciferol (VITAMIN D-3) 1000 units CAPS Take 1,000 Units by mouth daily.    Historical Provider, MD  doxepin (SINEQUAN) 25 MG capsule Take 50 mg by mouth at bedtime.    Historical Provider, MD  esomeprazole (NEXIUM) 40 MG capsule Take 1 capsule (40 mg total) by mouth daily before breakfast. Patient taking differently: Take 40 mg by mouth 2 (two) times daily.  10/30/14   Barton Dubois, MD  ferrous sulfate 325 (65 FE) MG EC tablet Take 1 tablet (325 mg total) by mouth daily with breakfast. 02/17/16   Florencia Reasons, MD  furosemide (LASIX) 40 MG tablet Take 1 tablet (40 mg total) by mouth daily. 02/17/16   Florencia Reasons, MD  guaiFENesin (MUCINEX) 600 MG 12 hr tablet Take 1 tablet (600 mg total) by mouth 2 (two) times daily. Patient taking differently: Take 600 mg by mouth 2 (two) times  daily as needed for cough or to loosen phlegm.  02/17/16   Florencia Reasons, MD  Insulin Detemir (LEVEMIR FLEXTOUCH) 100 UNIT/ML Pen Inject 10 Units into the skin at bedtime.    Historical Provider, MD  insulin lispro (HUMALOG KWIKPEN) 100 UNIT/ML KiwkPen Inject 10 Units into the skin 3 (three) times daily.    Historical Provider, MD  levothyroxine (SYNTHROID, LEVOTHROID) 150 MCG tablet Take 1 tablet (150 mcg total) by mouth daily before breakfast. 02/18/16   Florencia Reasons, MD  LORazepam (ATIVAN) 2 MG tablet Take 1 tablet (2 mg total) by mouth every 12 (twelve) hours as needed for anxiety. Patient taking differently: Take 1-2 mg by mouth See admin instructions. 1 mg in the morning and 2 mg at bedtime for insomnia 11/29/14   Gildardo Cranker, DO  LYRICA 100 MG capsule Take 100 mg by mouth 2 (two) times daily. 10/31/15   Historical Provider, MD  oxyCODONE (ROXICODONE) 15 MG immediate release tablet Take 1 tablet (15 mg total) by mouth every 6 (six) hours as needed for pain. Patient taking differently: Take 15 mg by mouth every 4 (four) hours as needed for pain.  11/29/14   Gildardo Cranker, DO  polyethylene glycol (MIRALAX / GLYCOLAX) packet Take 17 g by mouth daily. 02/18/16   Florencia Reasons, MD  senna-docusate (SENOKOT-S) 8.6-50 MG tablet Take 2 tablets by mouth at bedtime. 02/17/16   Florencia Reasons, MD  sitaGLIPtin-metformin (JANUMET) 50-500 MG per tablet Take 1 tablet by mouth 2 (two) times daily with a meal.    Historical Provider, MD  Tapentadol HCl (NUCYNTA ER) 250 MG TB12 Take 250 mg by mouth every 12 (twelve) hours.    Historical Provider, MD  tiZANidine (ZANAFLEX) 2 MG tablet Take 2 mg by mouth 2 (two) times daily.    Historical Provider, MD  valACYclovir (VALTREX) 1000 MG tablet Take 1,000 mg by mouth 2 (two) times daily.     Historical Provider, MD  valsartan (DIOVAN) 40 MG tablet Take 1 tablet (40 mg total) by mouth daily. 02/17/16   Florencia Reasons, MD  venlafaxine XR (EFFEXOR-XR) 150 MG 24 hr capsule Take 150 mg by mouth 2 (two) times  daily. 05/21/15  Historical Provider, MD  ziprasidone (GEODON) 40 MG capsule Take 40 mg by mouth daily after supper. 05/21/15   Historical Provider, MD    Family History Family History  Problem Relation Age of Onset  . Heart disease Brother     Multiple MIs, starting in his 28s  . Diabetes Brother   . Hypertension Mother   . Diabetes Mother   . Breast cancer Mother 67  . Bone cancer Mother   . Hypertension Sister   . Diabetes Sister   . Breast cancer Sister 73  . Breast cancer Maternal Grandmother   . Heart disease Maternal Grandmother   . Uterine cancer Other 19  . Breast cancer Paternal Aunt 88  . Breast cancer Paternal Grandmother     dx in her 63s  . Prostate cancer Paternal Grandfather     Social History Social History  Substance Use Topics  . Smoking status: Former Smoker    Packs/day: 1.00    Years: 7.00    Types: Cigarettes    Quit date: 03/24/1992  . Smokeless tobacco: Never Used  . Alcohol use No     Comment: "stopped drinking in 1996; I was an alcoholic"     Allergies   Bee venom; Shellfish allergy; Buprenorphine hcl; and Morphine and related   Review of Systems Review of Systems  Constitutional: Positive for activity change (confusion). Negative for chills and fever.  HENT: Positive for congestion. Negative for rhinorrhea.   Eyes: Negative for redness and visual disturbance.  Respiratory: Positive for cough. Negative for shortness of breath and wheezing.   Cardiovascular: Negative for chest pain and palpitations.  Gastrointestinal: Negative for nausea and vomiting.  Genitourinary: Negative for dysuria and urgency.  Musculoskeletal: Positive for myalgias. Negative for arthralgias.  Skin: Negative for pallor and wound.  Neurological: Negative for dizziness and headaches.     Physical Exam Updated Vital Signs BP (!) 93/47   Pulse 95   Temp 99.5 F (37.5 C) (Oral)   Resp 13   Wt (!) 339 lb (153.8 kg)   SpO2 92%   BMI 56.41 kg/m   Physical Exam   Constitutional: She is oriented to person, place, and time. She appears well-developed and well-nourished. No distress.  HENT:  Head: Normocephalic and atraumatic.  Eyes: EOM are normal. Pupils are equal, round, and reactive to light.  Neck: Normal range of motion. Neck supple.  Cardiovascular: Normal rate and regular rhythm.  Exam reveals no gallop and no friction rub.   No murmur heard. Pulmonary/Chest: Effort normal. She has no wheezes. She has no rales.  Abdominal: Soft. She exhibits no distension and no mass. There is no tenderness. There is no guarding.  Musculoskeletal: She exhibits no edema or tenderness.  Neurological: She is alert and oriented to person, place, and time.  Skin: Skin is warm and dry. She is not diaphoretic.  Psychiatric: She has a normal mood and affect. Her behavior is normal.  Nursing note and vitals reviewed.    ED Treatments / Results  Labs (all labs ordered are listed, but only abnormal results are displayed) Labs Reviewed  COMPREHENSIVE METABOLIC PANEL - Abnormal; Notable for the following:       Result Value   Sodium 131 (*)    Chloride 93 (*)    Glucose, Bld 123 (*)    BUN 44 (*)    Creatinine, Ser 2.38 (*)    Calcium 8.7 (*)    Total Protein 5.7 (*)    Albumin 3.1 (*)  GFR calc non Af Amer 21 (*)    GFR calc Af Amer 25 (*)    All other components within normal limits  CBC WITH DIFFERENTIAL/PLATELET - Abnormal; Notable for the following:    WBC 14.8 (*)    RBC 3.00 (*)    Hemoglobin 7.8 (*)    HCT 24.8 (*)    RDW 17.3 (*)    Neutro Abs 12.2 (*)    All other components within normal limits  I-STAT CG4 LACTIC ACID, ED - Abnormal; Notable for the following:    Lactic Acid, Venous 2.56 (*)    All other components within normal limits  I-STAT CHEM 8, ED - Abnormal; Notable for the following:    Sodium 131 (*)    Chloride 94 (*)    BUN 55 (*)    Creatinine, Ser 2.40 (*)    Glucose, Bld 115 (*)    Calcium, Ion 1.06 (*)    Hemoglobin  7.8 (*)    HCT 23.0 (*)    All other components within normal limits  I-STAT ARTERIAL BLOOD GAS, ED - Abnormal; Notable for the following:    Bicarbonate 28.7 (*)    Acid-Base Excess 4.0 (*)    All other components within normal limits  CULTURE, BLOOD (ROUTINE X 2)  CULTURE, BLOOD (ROUTINE X 2)  URINE CULTURE  URINALYSIS, ROUTINE W REFLEX MICROSCOPIC  INFLUENZA PANEL BY PCR (TYPE A & B)  HEPARIN LEVEL (UNFRACTIONATED)  CBC  I-STAT TROPOININ, ED  I-STAT CG4 LACTIC ACID, ED  I-STAT CG4 LACTIC ACID, ED    EKG  EKG Interpretation  Date/Time:  Thursday March 27 2016 20:13:14 EST Ventricular Rate:  95 PR Interval:    QRS Duration: 90 QT Interval:  379 QTC Calculation: 477 R Axis:   7 Text Interpretation:  Sinus rhythm No significant change since last tracing Confirmed by Tymara Saur MD, DANIEL [phone]) on 03/27/2016 9:31:27 PM       Radiology Dg Chest 2 View  Result Date: 03/27/2016 CLINICAL DATA:  Sepsis EXAM: CHEST  2 VIEW COMPARISON:  02/29/2016 FINDINGS: Low lung volumes. Borderline cardiomegaly. No aortic aneurysm. Mild bilateral interstitial edema. Possible superimposed pneumonia at each lung base left greater than right given slightly more confluent airspace opacities noted. Surgical clips are seen at the base the neck as before. No sizable effusion. No pneumothorax identified. No acute osseous abnormality. Small calcification about the right shoulder may reflect calcific tendinopathy. IMPRESSION: Low lung volumes with borderline cardiomegaly and interstitial edema. Subtle airspace opacities at the lung bases left greater than right cannot exclude superimposed pneumonia. Electronically Signed   By: Ashley Royalty M.D.   On: 03/27/2016 18:11   Ct Head Wo Contrast  Result Date: 03/27/2016 CLINICAL DATA:  Multiple falls. EXAM: CT HEAD WITHOUT CONTRAST TECHNIQUE: Contiguous axial images were obtained from the base of the skull through the vertex without intravenous contrast. COMPARISON:   02/29/2016 FINDINGS: Brain: There is no evidence for acute hemorrhage, hydrocephalus, mass lesion, or abnormal extra-axial fluid collection. No definite CT evidence for acute infarction. Vascular: No hyperdense vessel or unexpected calcification. Skull: Normal. Negative for fracture or focal lesion. Sinuses/Orbits: Visualized portions of the paranasal sinuses are clear. Right mastoid air cells are clear. Fluid is visible in the left mastoid air cells. Visualized portions of the globes and intraorbital fat are unremarkable. Other: None. IMPRESSION: 1. No acute intracranial abnormality. 2. Left mastoid effusion. Electronically Signed   By: Misty Stanley M.D.   On: 03/27/2016 17:50  Procedures Procedures (including critical care time)  Medications Ordered in ED Medications  vancomycin (VANCOCIN) 2,500 mg in sodium chloride 0.9 % 500 mL IVPB (2,500 mg Intravenous New Bag/Given 03/27/16 2241)  heparin bolus via infusion 4,000 Units (not administered)  heparin ADULT infusion 100 units/mL (25000 units/227m sodium chloride 0.45%) (not administered)  acetaminophen (TYLENOL) tablet 1,000 mg (1,000 mg Oral Given 03/27/16 1826)  piperacillin-tazobactam (ZOSYN) IVPB 3.375 g (3.375 g Intravenous New Bag/Given 03/27/16 2241)     Initial Impression / Assessment and Plan / ED Course  I have reviewed the triage vital signs and the nursing notes.  Pertinent labs & imaging results that were available during my care of the patient were reviewed by me and considered in my medical decision making (see chart for details).     61yo F with multiple falls over past couple of days. Patient also has had some transient confusion. Was just in the hospital with hospital acquired pneumonia. We'll repeat a chest x-ray obtain a UA. Check basic labs. CT the head with multiple falls recently as well. With generalized weakness will check a troponin. The patient had a reported blood pressure in the field and in the 70s. Here her  blood pressure has been in the 1638Vsystolic. Will give a liter of fluids. Send off for cultures.  Patient continues to have a soft blood pressure. Lactate very minimally elevated. Chest x-ray concerning for possible pneumonia. Patient also has an acute kidney injury. We'll give more fluids. Broadly cover with antibiotics as patient was just in the hospital. Discussed with hospitalist.  The patients results and plan were reviewed and discussed.   Any x-rays performed were independently reviewed by myself.   Differential diagnosis were considered with the presenting HPI.  Medications  vancomycin (VANCOCIN) 2,500 mg in sodium chloride 0.9 % 500 mL IVPB (2,500 mg Intravenous New Bag/Given 03/27/16 2241)  heparin bolus via infusion 4,000 Units (not administered)  heparin ADULT infusion 100 units/mL (25000 units/2573msodium chloride 0.45%) (not administered)  acetaminophen (TYLENOL) tablet 1,000 mg (1,000 mg Oral Given 03/27/16 1826)  piperacillin-tazobactam (ZOSYN) IVPB 3.375 g (3.375 g Intravenous New Bag/Given 03/27/16 2241)    Vitals:   03/27/16 2100 03/27/16 2130 03/27/16 2145 03/27/16 2230  BP: (!) 97/49 (!) 98/49 (!) 110/53 (!) 93/47  Pulse: 96 94 96 95  Resp: _0 Temp:      TempSrc:      SpO2: 90% (!) 89% 92% 92%  Weight:        Final diagnoses:  HCAP (healthcare-associated pneumonia)  AKI (acute kidney injury) ([place]   Admission/ observation were discussed with the admitting physician, patient and/or family and they are comfortable with the plan.      Final Clinical Impressions(s) / ED Diagnoses   Final diagnoses:  HCAP (healthcare-associated pneumonia)  [facility]   New Prescriptions New Prescriptions   No medications on file     DaDeno EtienneDO 03/27/16 2330

## 2016-03-27 NOTE — ED Triage Notes (Signed)
Paramedics was called out for a fall  And upon arrival pt. Refused to come to the ED.  Paramedics assisted pt. Back into bed.  Once they were there they observed the pt.s speech was slurred.  Pt.s son and caregiver reports that the pt. 's speech is slurred after taking her medications. (Lorazepam, oxycodone. )  Pt.s speech is clear  And pt. Has no neuro deficits.  Pt.'s BP was hypotensive at the scene 70/40.  Pt. Also reports that she is having rt. Flank pain

## 2016-03-27 NOTE — ED Notes (Signed)
Attempted to start IV x 2 without success-- LAC and L upper arm. IV team consult placed.

## 2016-03-28 ENCOUNTER — Inpatient Hospital Stay (HOSPITAL_COMMUNITY): Payer: Medicare Other

## 2016-03-28 DIAGNOSIS — J189 Pneumonia, unspecified organism: Secondary | ICD-10-CM

## 2016-03-28 DIAGNOSIS — N179 Acute kidney failure, unspecified: Secondary | ICD-10-CM

## 2016-03-28 DIAGNOSIS — G894 Chronic pain syndrome: Secondary | ICD-10-CM

## 2016-03-28 DIAGNOSIS — M79609 Pain in unspecified limb: Secondary | ICD-10-CM

## 2016-03-28 DIAGNOSIS — E118 Type 2 diabetes mellitus with unspecified complications: Secondary | ICD-10-CM

## 2016-03-28 DIAGNOSIS — F201 Disorganized schizophrenia: Secondary | ICD-10-CM

## 2016-03-28 LAB — COMPREHENSIVE METABOLIC PANEL
ALBUMIN: 2.9 g/dL — AB (ref 3.5–5.0)
ALT: 17 U/L (ref 14–54)
AST: 16 U/L (ref 15–41)
Alkaline Phosphatase: 55 U/L (ref 38–126)
Anion gap: 9 (ref 5–15)
BILIRUBIN TOTAL: 0.5 mg/dL (ref 0.3–1.2)
BUN: 42 mg/dL — AB (ref 6–20)
CHLORIDE: 98 mmol/L — AB (ref 101–111)
CO2: 30 mmol/L (ref 22–32)
CREATININE: 2.14 mg/dL — AB (ref 0.44–1.00)
Calcium: 8.9 mg/dL (ref 8.9–10.3)
GFR calc Af Amer: 28 mL/min — ABNORMAL LOW (ref >60)
GFR calc non Af Amer: 24 mL/min — ABNORMAL LOW (ref >60)
GLUCOSE: 124 mg/dL — AB (ref 65–99)
POTASSIUM: 3.7 mmol/L (ref 3.5–5.1)
Sodium: 137 mmol/L (ref 135–145)
TOTAL PROTEIN: 5.8 g/dL — AB (ref 6.5–8.1)

## 2016-03-28 LAB — CBC WITH DIFFERENTIAL/PLATELET
Basophils Absolute: 0 10*3/uL (ref 0.0–0.1)
Basophils Relative: 0 %
EOS ABS: 0.4 10*3/uL (ref 0.0–0.7)
EOS PCT: 4 %
HCT: 23.8 % — ABNORMAL LOW (ref 36.0–46.0)
Hemoglobin: 7.6 g/dL — ABNORMAL LOW (ref 12.0–15.0)
LYMPHS ABS: 1.2 10*3/uL (ref 0.7–4.0)
LYMPHS PCT: 12 %
MCH: 26.4 pg (ref 26.0–34.0)
MCHC: 31.9 g/dL (ref 30.0–36.0)
MCV: 82.6 fL (ref 78.0–100.0)
MONO ABS: 0.4 10*3/uL (ref 0.1–1.0)
Monocytes Relative: 3 %
Neutro Abs: 8.3 10*3/uL — ABNORMAL HIGH (ref 1.7–7.7)
Neutrophils Relative %: 81 %
PLATELETS: 206 10*3/uL (ref 150–400)
RBC: 2.88 MIL/uL — ABNORMAL LOW (ref 3.87–5.11)
RDW: 17.1 % — AB (ref 11.5–15.5)
WBC: 10.3 10*3/uL (ref 4.0–10.5)

## 2016-03-28 LAB — BASIC METABOLIC PANEL
Anion gap: 10 (ref 5–15)
BUN: 43 mg/dL — AB (ref 6–20)
CHLORIDE: 98 mmol/L — AB (ref 101–111)
CO2: 28 mmol/L (ref 22–32)
Calcium: 8.9 mg/dL (ref 8.9–10.3)
Creatinine, Ser: 2.08 mg/dL — ABNORMAL HIGH (ref 0.44–1.00)
GFR calc Af Amer: 29 mL/min — ABNORMAL LOW (ref >60)
GFR calc non Af Amer: 25 mL/min — ABNORMAL LOW (ref >60)
GLUCOSE: 124 mg/dL — AB (ref 65–99)
POTASSIUM: 3.6 mmol/L (ref 3.5–5.1)
SODIUM: 136 mmol/L (ref 135–145)

## 2016-03-28 LAB — MRSA PCR SCREENING: MRSA BY PCR: NEGATIVE

## 2016-03-28 LAB — TSH: TSH: 2.363 u[IU]/mL (ref 0.350–4.500)

## 2016-03-28 LAB — CBC
HEMATOCRIT: 24 % — AB (ref 36.0–46.0)
HEMOGLOBIN: 7.6 g/dL — AB (ref 12.0–15.0)
MCH: 26.2 pg (ref 26.0–34.0)
MCHC: 31.7 g/dL (ref 30.0–36.0)
MCV: 82.8 fL (ref 78.0–100.0)
Platelets: 201 10*3/uL (ref 150–400)
RBC: 2.9 MIL/uL — ABNORMAL LOW (ref 3.87–5.11)
RDW: 17.1 % — ABNORMAL HIGH (ref 11.5–15.5)
WBC: 10.2 10*3/uL (ref 4.0–10.5)

## 2016-03-28 LAB — MAGNESIUM: Magnesium: 1.7 mg/dL (ref 1.7–2.4)

## 2016-03-28 LAB — HIV ANTIBODY (ROUTINE TESTING W REFLEX): HIV Screen 4th Generation wRfx: NONREACTIVE

## 2016-03-28 LAB — STREP PNEUMONIAE URINARY ANTIGEN: Strep Pneumo Urinary Antigen: NEGATIVE

## 2016-03-28 LAB — CBG MONITORING, ED: GLUCOSE-CAPILLARY: 118 mg/dL — AB (ref 65–99)

## 2016-03-28 LAB — HEPARIN LEVEL (UNFRACTIONATED)
HEPARIN UNFRACTIONATED: 0.24 [IU]/mL — AB (ref 0.30–0.70)
HEPARIN UNFRACTIONATED: 0.3 [IU]/mL (ref 0.30–0.70)

## 2016-03-28 LAB — GLUCOSE, CAPILLARY
GLUCOSE-CAPILLARY: 106 mg/dL — AB (ref 65–99)
Glucose-Capillary: 121 mg/dL — ABNORMAL HIGH (ref 65–99)
Glucose-Capillary: 135 mg/dL — ABNORMAL HIGH (ref 65–99)
Glucose-Capillary: 131 mg/dL — ABNORMAL HIGH (ref 65–99)

## 2016-03-28 LAB — T4, FREE: FREE T4: 0.8 ng/dL (ref 0.61–1.12)

## 2016-03-28 LAB — LACTIC ACID, PLASMA
Lactic Acid, Venous: 1.1 mmol/L (ref 0.5–1.9)
Lactic Acid, Venous: 0.9 mmol/L (ref 0.5–1.9)

## 2016-03-28 MED ORDER — ONDANSETRON HCL 4 MG/2ML IJ SOLN
4.0000 mg | Freq: Four times a day (QID) | INTRAMUSCULAR | Status: DC | PRN
  Administered 2016-04-01 (×2): 4 mg via INTRAVENOUS
  Filled 2016-03-28 (×2): qty 2

## 2016-03-28 MED ORDER — ONDANSETRON HCL 4 MG PO TABS: 4.0000 mg | ORAL_TABLET | Freq: Four times a day (QID) | ORAL | Status: DC | PRN

## 2016-03-28 MED ORDER — MORPHINE SULFATE (PF) 2 MG/ML IV SOLN
1.0000 mg | Freq: Four times a day (QID) | INTRAVENOUS | Status: DC | PRN
  Administered 2016-03-31 – 2016-04-04 (×8): 1 mg via INTRAVENOUS
  Filled 2016-03-28 (×9): qty 1

## 2016-03-28 MED ORDER — DEXTROSE 5 % IV SOLN
2.0000 g | Freq: Once | INTRAVENOUS | Status: AC
  Administered 2016-03-28: 2 g via INTRAVENOUS
  Filled 2016-03-28: qty 2

## 2016-03-28 MED ORDER — METHYLPREDNISOLONE SODIUM SUCC 125 MG IJ SOLR
60.0000 mg | INTRAMUSCULAR | Status: DC
  Administered 2016-03-28 – 2016-03-30 (×3): 60 mg via INTRAVENOUS
  Filled 2016-03-28 (×3): qty 2

## 2016-03-28 MED ORDER — CHLORHEXIDINE GLUCONATE 0.12 % MT SOLN
15.0000 mL | Freq: Two times a day (BID) | OROMUCOSAL | Status: DC
  Administered 2016-03-28 – 2016-04-07 (×19): 15 mL via OROMUCOSAL
  Filled 2016-03-28 (×18): qty 15

## 2016-03-28 MED ORDER — DEXTROSE 5 % IV SOLN: 2.0000 g | Freq: Once | INTRAVENOUS | Status: DC

## 2016-03-28 MED ORDER — INSULIN ASPART 100 UNIT/ML ~~LOC~~ SOLN
0.0000 [IU] | Freq: Three times a day (TID) | SUBCUTANEOUS | Status: DC
  Administered 2016-03-28 (×2): 3 [IU] via SUBCUTANEOUS
  Administered 2016-03-29: 4 [IU] via SUBCUTANEOUS
  Administered 2016-03-29: 3 [IU] via SUBCUTANEOUS
  Administered 2016-03-30 (×3): 4 [IU] via SUBCUTANEOUS
  Administered 2016-03-31 (×3): 7 [IU] via SUBCUTANEOUS
  Administered 2016-04-01: 3 [IU] via SUBCUTANEOUS
  Administered 2016-04-01 (×2): 4 [IU] via SUBCUTANEOUS
  Administered 2016-04-02 (×2): 3 [IU] via SUBCUTANEOUS
  Administered 2016-04-02: 4 [IU] via SUBCUTANEOUS
  Administered 2016-04-03 – 2016-04-04 (×2): 3 [IU] via SUBCUTANEOUS
  Administered 2016-04-04: 4 [IU] via SUBCUTANEOUS
  Administered 2016-04-04 – 2016-04-06 (×5): 3 [IU] via SUBCUTANEOUS
  Administered 2016-04-06: 4 [IU] via SUBCUTANEOUS
  Administered 2016-04-06: 3 [IU] via SUBCUTANEOUS
  Administered 2016-04-07 (×2): 4 [IU] via SUBCUTANEOUS

## 2016-03-28 MED ORDER — ENOXAPARIN SODIUM 150 MG/ML ~~LOC~~ SOLN
1.0000 mg/kg | Freq: Two times a day (BID) | SUBCUTANEOUS | Status: DC
  Administered 2016-03-28 – 2016-03-30 (×3): 160 mg via SUBCUTANEOUS
  Filled 2016-03-28 (×3): qty 1.06

## 2016-03-28 MED ORDER — ORAL CARE MOUTH RINSE
15.0000 mL | Freq: Two times a day (BID) | OROMUCOSAL | Status: DC
  Administered 2016-03-28 – 2016-03-31 (×7): 15 mL via OROMUCOSAL

## 2016-03-28 MED ORDER — ACETAMINOPHEN 650 MG RE SUPP
650.0000 mg | Freq: Four times a day (QID) | RECTAL | Status: DC | PRN
  Administered 2016-03-28 – 2016-03-30 (×3): 650 mg via RECTAL
  Filled 2016-03-28 (×3): qty 1

## 2016-03-28 MED ORDER — LORAZEPAM 2 MG/ML IJ SOLN
1.0000 mg | Freq: Every day | INTRAMUSCULAR | Status: DC
  Administered 2016-03-28 – 2016-03-30 (×3): 1 mg via INTRAVENOUS
  Filled 2016-03-28 (×3): qty 1

## 2016-03-28 MED ORDER — SODIUM CHLORIDE 0.9% FLUSH
3.0000 mL | Freq: Two times a day (BID) | INTRAVENOUS | Status: DC
  Administered 2016-03-28 – 2016-04-07 (×15): 3 mL via INTRAVENOUS

## 2016-03-28 MED ORDER — VANCOMYCIN HCL 10 G IV SOLR
1750.0000 mg | INTRAVENOUS | Status: DC
  Administered 2016-03-28 – 2016-03-30 (×3): 1750 mg via INTRAVENOUS
  Filled 2016-03-28 (×4): qty 1750

## 2016-03-28 MED ORDER — IPRATROPIUM-ALBUTEROL 0.5-2.5 (3) MG/3ML IN SOLN
3.0000 mL | Freq: Four times a day (QID) | RESPIRATORY_TRACT | Status: DC
  Administered 2016-03-28: 3 mL via RESPIRATORY_TRACT
  Filled 2016-03-28: qty 3

## 2016-03-28 MED ORDER — ALBUTEROL SULFATE (2.5 MG/3ML) 0.083% IN NEBU: 3.0000 mL | INHALATION_SOLUTION | RESPIRATORY_TRACT | Status: DC | PRN

## 2016-03-28 MED ORDER — DEXTROSE-NACL 5-0.9 % IV SOLN
INTRAVENOUS | Status: DC
  Administered 2016-03-28 (×2): via INTRAVENOUS
  Administered 2016-03-30: 100 mL via INTRAVENOUS
  Administered 2016-03-30: 19:00:00 via INTRAVENOUS

## 2016-03-28 MED ORDER — ACETAMINOPHEN 325 MG PO TABS
650.0000 mg | ORAL_TABLET | Freq: Four times a day (QID) | ORAL | Status: DC | PRN
  Administered 2016-04-06: 650 mg via ORAL
  Filled 2016-03-28 (×2): qty 2

## 2016-03-28 MED ORDER — HALOPERIDOL LACTATE 5 MG/ML IJ SOLN
1.0000 mg | Freq: Two times a day (BID) | INTRAMUSCULAR | Status: DC
  Administered 2016-03-28 – 2016-04-04 (×15): 1 mg via INTRAVENOUS
  Filled 2016-03-28 (×16): qty 1

## 2016-03-28 MED ORDER — ORAL CARE MOUTH RINSE
15.0000 mL | Freq: Two times a day (BID) | OROMUCOSAL | Status: DC
  Administered 2016-03-28 – 2016-04-07 (×17): 15 mL via OROMUCOSAL

## 2016-03-28 MED ORDER — DEXTROSE 5 % IV SOLN
2.0000 g | INTRAVENOUS | Status: DC
  Administered 2016-03-29 – 2016-03-31 (×3): 2 g via INTRAVENOUS
  Filled 2016-03-28 (×3): qty 2

## 2016-03-28 MED ORDER — IPRATROPIUM-ALBUTEROL 0.5-2.5 (3) MG/3ML IN SOLN
3.0000 mL | Freq: Three times a day (TID) | RESPIRATORY_TRACT | Status: DC
  Filled 2016-03-28: qty 3

## 2016-03-28 MED ORDER — WHITE PETROLATUM GEL
Status: AC
  Administered 2016-03-28: 07:00:00
  Filled 2016-03-28: qty 1

## 2016-03-28 NOTE — Progress Notes (Signed)
PROGRESS NOTE    [name]  WUX:[id number] DOB: 1955/11/29 DOA: 03/27/2016 PCP: [name], MD   Brief Narrative:  61 y.o. BF PMHx Schizophrenia, Anxiety,Depression, CKD stage III, HTN, HLD, Obesity, DM type II uncontrolled with complication, OSA/OHS, morbid obesity, obstructive sleep apnea, diagnosed 2008 Lt Breast cancer Ductal Carcinoma in Situ, S/P lumpectomy + XRT, HSV (herpes simplex virus) infection,Chronic lower back pain   she was recently hospitalized here for community-acquired pneumonia presenting with AMS.  She is unable to answer any questions and is unaccompanied.    HPI for Dr. Tyrone Nine:    60 yo F with a chief complaint of altered mental status. Family said that they've been acting more confused today. Having some mild cough and side pain. Patient is currently more alert than she had been earlier today. States that she has no issues with her side currently but has been coughing. Having some diffuse myalgias worse to the legs. Family denies any recent medication changes. Has been having multiple falls over the past couple days.  She was previously hospitalized: -1/12-15/18 for sepsis due to HCAP, acute hypoxic respiratory failure, slurred speech, and  AKI on CKD. -12/27-31/17 for sepsis due to multifocal PNA with mild AKI, metabolic encephalopathy; acute on chronic diastolic CHF exacerbation  ED Course: Per Dr. Tyrone Nine: [name] with multiple falls over past couple of days. Patient also has had some transient confusion. Was just in the hospital with hospital acquired pneumonia. Will repeat a chest x-ray obtain a UA. Check basic labs. CT the head with multiple falls recently as well. With generalized weakness will check a troponin. The patient had a reported blood pressure in the field and in the 70s. Here her blood pressure has been in the 253G systolic. Will give a liter of fluids. Send off for cultures.  Patient continues to have a soft blood pressure. Lactate very  minimally elevated. Chest x-ray concerning for possible pneumonia. Patient also has an acute kidney injury. Will give more fluids. Broadly cover with antibiotics as patient was just in the hospital. Discussed with hospitalist   Subjective: 2/9  A/O 4. States her pain management specialist is Dr. Balinda Quails at [facility] pain management. Uses her CPAP as directed. Uses walker to ambulate at home.    Assessment & Plan:   Principal Problem:   Encephalopathy Active Problems:   Hypothyroidism   Anemia   Diabetes mellitus, type II, insulin dependent (HCC)   HCAP (healthcare-associated pneumonia)   Acute renal failure (ARF) (HCC)   Acute Encephalopathy -Differential is vast and includes infection (has HCAP, see below); PE; CVA; overdose; renal failure; metabolic encephalopathy. -CT head negative -Resolved -Patient taking large amounts of narcotics, benzodiazepines, muscle relaxant, antipsychotics which most likely is contributing to her encephalopathy.  Schizophrenia -See chronic pain and dysphagia   Chronic pain syndrome  -in order to avoid withdrawal will need to continue some patient's home medications at a lower dose  A. Ativan 1 mg QHS  B. Morphine 1 mg  q 6 hr PRN  C. Haldol IV 2 mg BID  D. hold Lyrica, Zanaflex, Geodon until patent passes swallow study.  Dysphagia -2/9 patient miserably failed bedside swallow. Speech will return tomorrow for further evaluation, appears by their note leaning toward a FEES which would not be accomplished until Monday.   HCAP/PE? -Given recent hospitalizations for CAP/HCAP in conjunction with abnormal CXR findings, will treat for HCAP. -Most likely secondary to aspiration, secondary to medication overdose, and dysphagia -Cefepime and Vancomycin.  Complete 5 day course antibiotics -D5-0.9% saline 188m/hr -DuoNeb QID -Flonase  -Flutter Valve -Solu-Medrol 60 mg daily -Sputum cultures pending -Blood cultures -Strep pneumo  testing -Influenza negative -With multiple episodes of PNA in the last 6 weeks, underlying lesion is of some concern as well as PE. When patient more alert obtain VQ scan -Attempted to obtain CTA but unable to do so due to creatinine.  -Lower extremity Doppler negative  -Lovenox  ARF(Baseline1.07 on 1/14)  -Creatinine 2.40,  -Rehydrate and follow Lab Results  Component Value Date   CREATININE 2.08 (H) 03/28/2016   CREATININE 2.14 (H) 03/28/2016   CREATININE 2.40 (H) 03/27/2016   Anemia -Hgb 7.8, 8.7 on 1/14 -Will follow Recent Labs Lab 03/27/16 1850 03/27/16 1933 03/28/16 0917  HGB 7.8* 7.8* 7.6*  7.6*   Hypothyroidism -Low TSH in December -Will recheck TSH and free T4 at this time  DM -A1c 7.3 in May 2017, 6.1 in 12/17 -Will not recheck -Hold long-acting insulin for now, cover with SSI   DVT prophylaxis: Lovenox Code Status: Full Family Communication:  Disposition Plan: Resolution encephalopathy/pneumonia   Consultants:  None  Procedures/Significant Events:  2/8 DG chest:Low lung volumes with borderline cardiomegaly and interstitial edema. Subtle airspace opacities at the lung bases left greater than right cannot exclude superimposed pneumonia 2/9 CT abdomen pelvis WO contrast:-Negative for nephrolithiasis hydronephrosis or ureteral stone. - Mild density in the left > right posterior lower lobes could reflect pneumonia or possible aspiration.   VENTILATOR SETTINGS: None   Cultures 2/8 blood NGTD 2/8 urine pending 2/9 sputum pending  Antimicrobials: Cefepime Vancomycin   Devices    LINES / TUBES:      Continuous Infusions: . heparin 1,900 Units/hr (03/28/16 0800)     Objective: Vitals:   03/28/16 0530 03/28/16 0626 03/28/16 0748 03/28/16 0800  BP: (!) 91/47 (!) 95/50 132/69 130/61  Pulse: 90 90 90 92  Resp: _0 Temp: 98.5 F (36.9 C) 98 F (36.7 C) 98.1 F (36.7 C)   TempSrc: Oral Oral Oral   SpO2: 95% 99% 100% 100%   Weight:  (!) 158.8 kg (350 lb 1.6 oz)    Height:  _1  (1.651 m)      Intake/Output Summary (Last 24 hours) at 03/28/16 0900 Last data filed at 03/28/16 0800  Gross per 24 hour  Intake           123.73 ml  Output             2150 ml  Net         -2026.27 ml   Filed Weights   03/27/16 1608 03/28/16 0626  Weight: (!) 153.8 kg (339 lb) (!) 158.8 kg (350 lb 1.6 oz)    Examination:  General: A/O 4 positive acute respiratory distress Eyes: negative scleral hemorrhage, negative anisocoria, negative icterus ENT: Negative Runny nose, negative gingival bleeding, Neck:  Negative scars, masses, torticollis, lymphadenopathy, JVD Lungs: difficult to appreciate breath sounds secondary to body habitus. Mild expiratory wheezing.  Cardiovascular: Regular rate and rhythm without murmur gallop or rub normal S1 and S2 Abdomen: MORBIDLY OBESE, negative abdominal pain, nondistended, positive soft, bowel sounds, no rebound, no ascites, no appreciable mass Extremities: No significant cyanosis, clubbing, or edema bilateral lower extremities Skin: Negative rashes, lesions, ulcers Psychiatric:  Negative depression, negative anxiety, negative fatigue, negative mania  Central nervous system:  Cranial nerves II through XII intact, tongue/uvula midline, all extremities muscle strength 5/5, sensation intact throughout,  negative dysarthria, negative expressive  aphasia, negative receptive aphasia.  .     Data Reviewed: Care during the described time interval was provided by me .  I have reviewed this patient's available data, including medical history, events of note, physical examination, and all test results as part of my evaluation. I have personally reviewed and interpreted all radiology studies.  CBC:  Recent Labs Lab 03/27/16 1850 03/27/16 1933  WBC 14.8*  --   NEUTROABS 12.2*  --   HGB 7.8* 7.8*  HCT 24.8* 23.0*  MCV 82.7  --   PLT 252  --    Basic Metabolic Panel:  Recent Labs Lab  03/27/16 1850 03/27/16 1933  NA 131* 131*  K 4.7 4.8  CL 93* 94*  CO2 26  --   GLUCOSE 123* 115*  BUN 44* 55*  CREATININE 2.38* 2.40*  CALCIUM 8.7*  --    GFR: Estimated Creatinine Clearance: 38.9 mL/min (by C-G formula based on SCr of 2.4 mg/dL (H)). Liver Function Tests:  Recent Labs Lab 03/27/16 1850  AST 32  ALT 18  ALKPHOS 57  BILITOT 0.8  PROT 5.7*  ALBUMIN 3.1*   No results for input(s): LIPASE, AMYLASE in the last 168 hours. No results for input(s): AMMONIA in the last 168 hours. Coagulation Profile: No results for input(s): INR, PROTIME in the last 168 hours. Cardiac Enzymes: No results for input(s): CKTOTAL, CKMB, CKMBINDEX, TROPONINI in the last 168 hours. BNP (last 3 results) No results for input(s): PROBNP in the last 8760 hours. HbA1C: No results for input(s): HGBA1C in the last 72 hours. CBG:  Recent Labs Lab 03/28/16 0543 03/28/16 0746  GLUCAP 118* 106*   Lipid Profile: No results for input(s): CHOL, HDL, LDLCALC, TRIG, CHOLHDL, LDLDIRECT in the last 72 hours. Thyroid Function Tests:  Recent Labs  03/28/16 0431  TSH 2.363  FREET4 0.80   Anemia Panel: No results for input(s): VITAMINB12, FOLATE, FERRITIN, TIBC, IRON, RETICCTPCT in the last 72 hours. Urine analysis:    Component Value Date/Time   COLORURINE YELLOW 03/27/2016 2020   APPEARANCEUR CLEAR 03/27/2016 2020   APPEARANCEUR Clear 11/13/2013 1310   LABSPEC 1.012 03/27/2016 2020   LABSPEC 1.017 11/13/2013 1310   PHURINE 5.0 03/27/2016 2020   GLUCOSEU NEGATIVE 03/27/2016 2020   GLUCOSEU Negative 11/13/2013 1310   GLUCOSEU NEG mg/dL 03/02/2006 2050   HGBUR NEGATIVE 03/27/2016 2020   BILIRUBINUR NEGATIVE 03/27/2016 2020   BILIRUBINUR Negative 11/13/2013 [place] 03/27/2016 2020   PROTEINUR NEGATIVE 03/27/2016 2020   UROBILINOGEN 0.2 11/12/2014 2057   NITRITE NEGATIVE 03/27/2016 2020   LEUKOCYTESUR NEGATIVE 03/27/2016 2020   LEUKOCYTESUR Trace 11/13/2013 1310     Sepsis Labs: _0 (procalcitonin:4,lacticidven:4)  ) Recent Results (from the past 240 hour(s))  MRSA PCR Screening     Status: None   Collection Time: 03/28/16  6:30 AM  Result Value Ref Range Status   MRSA by PCR NEGATIVE NEGATIVE Final    Comment:        The GeneXpert MRSA Assay (FDA approved for NASAL specimens only), is one component of a comprehensive MRSA colonization surveillance program. It is not intended to diagnose MRSA infection nor to guide or monitor treatment for MRSA infections.          Radiology Studies: Ct Abdomen Pelvis Wo Contrast  Result Date: 03/28/2016 CLINICAL DATA:  Right flank pain EXAM: CT ABDOMEN AND PELVIS WITHOUT CONTRAST TECHNIQUE: Multidetector CT imaging of the abdomen and pelvis was performed following the standard protocol without IV contrast.  COMPARISON:  06/23/2015 FINDINGS: Lower chest: Mild ground-glass densities in the left greater than right posterior lower lobes. No effusion. The heart appears borderline enlarged. Hepatobiliary: No focal liver abnormality is seen. Status post cholecystectomy. No biliary dilatation. Pancreas: Unremarkable. No pancreatic ductal dilatation or surrounding inflammatory changes. Spleen: Normal in size without focal abnormality. Adrenals/Urinary Tract: Adrenal glands are unremarkable. Kidneys are normal, without renal calculi, focal lesion, or hydronephrosis. Bladder is unremarkable. Nonspecific perinephric fat stranding. Stomach/Bowel: Stomach is within normal limits. Appendix contains a few stones but is otherwise normal. No evidence of bowel wall thickening, distention, or inflammatory changes. Large amount of stool in the right and transverse colon. Vascular/Lymphatic: Aortic atherosclerosis. No enlarged abdominal or pelvic lymph nodes. Reproductive: Status post hysterectomy. No adnexal masses. Other: Small fat containing umbilical hernia. No free air or free fluid. Musculoskeletal: Degenerative changes.  No acute or suspicious bone lesions. IMPRESSION: 1. Negative for nephrolithiasis hydronephrosis or ureteral stone. 2. Mild density in the left greater than right posterior lower lobes could reflect pneumonia or possible aspiration. Electronically Signed   By: Donavan Foil M.D.   On: 03/28/2016 01:13   Dg Chest 2 View  Result Date: 03/27/2016 CLINICAL DATA:  Sepsis EXAM: CHEST  2 VIEW COMPARISON:  02/29/2016 FINDINGS: Low lung volumes. Borderline cardiomegaly. No aortic aneurysm. Mild bilateral interstitial edema. Possible superimposed pneumonia at each lung base left greater than right given slightly more confluent airspace opacities noted. Surgical clips are seen at the base the neck as before. No sizable effusion. No pneumothorax identified. No acute osseous abnormality. Small calcification about the right shoulder may reflect calcific tendinopathy. IMPRESSION: Low lung volumes with borderline cardiomegaly and interstitial edema. Subtle airspace opacities at the lung bases left greater than right cannot exclude superimposed pneumonia. Electronically Signed   By: Ashley Royalty M.D.   On: 03/27/2016 18:11   Ct Head Wo Contrast  Result Date: 03/27/2016 CLINICAL DATA:  Multiple falls. EXAM: CT HEAD WITHOUT CONTRAST TECHNIQUE: Contiguous axial images were obtained from the base of the skull through the vertex without intravenous contrast. COMPARISON:  02/29/2016 FINDINGS: Brain: There is no evidence for acute hemorrhage, hydrocephalus, mass lesion, or abnormal extra-axial fluid collection. No definite CT evidence for acute infarction. Vascular: No hyperdense vessel or unexpected calcification. Skull: Normal. Negative for fracture or focal lesion. Sinuses/Orbits: Visualized portions of the paranasal sinuses are clear. Right mastoid air cells are clear. Fluid is visible in the left mastoid air cells. Visualized portions of the globes and intraorbital fat are unremarkable. Other: None. IMPRESSION: 1. No acute  intracranial abnormality. 2. Left mastoid effusion. Electronically Signed   By: Misty Stanley M.D.   On: 03/27/2016 17:50        Scheduled Meds: . [START ON 03/29/2016] ceFEPime (MAXIPIME) IV  2 g Intravenous Q24H  . insulin aspart  0-20 Units Subcutaneous TID WC  . mouth rinse  15 mL Mouth Rinse BID  . sodium chloride flush  3 mL Intravenous Q12H  . vancomycin  1,750 mg Intravenous Q24H   Continuous Infusions: . heparin 1,900 Units/hr (03/28/16 0800)     LOS: 1 day    Time spent: 40 minutes    Yesika Rispoli, Geraldo Docker, MD [facility] Pager [phone]   If 7PM-7AM, please contact night-coverage [email or web address] Password [email or web address] 03/28/2016, 9:00 AM

## 2016-03-28 NOTE — Progress Notes (Signed)
Spoke with patient in regards to FEES examination. Patient stated that she did not remember having conversation with the Speech therapist regarding swallowing exam. Patient agreeable to having swallowing evaluation done.

## 2016-03-28 NOTE — Progress Notes (Signed)
Preliminary results by tech - Venous Duplex Lower Ext. Completed. Negative for deep vein thrombosis in both lower extremities in the veins that were visualized. The right peroneal veins were not clearly visualized due to body habitus.  Oda Cogan, BS, RDMS, RVT

## 2016-03-28 NOTE — Progress Notes (Signed)
ANTICOAGULATION CONSULT NOTE - Follow Up Consult  Pharmacy Consult for heparin Indication: r/o VTE  Labs:  Recent Labs  03/27/16 1850 03/27/16 1933 03/28/16 0432 03/28/16 0917 03/28/16 1426  HGB 7.8* 7.8*  --  7.6*  7.6*  --   HCT 24.8* 23.0*  --  24.0*  23.8*  --   PLT 252  --   --  201  206  --   HEPARINUNFRC  --   --  0.24*  --  0.30  CREATININE 2.38* 2.40*  --  2.14*  2.08*  --     Assessment: 61yo female on heparin to r/o VTE. Has been on IV heparin 1900 units/hr with therapeutic heparin level of 0.3 this afternoon.  MD now changing therapy to SQ Lovenox for treatment for PE until we can obtain VQ scan.  Obese 61 y.o female, SCr 2.08,  CrCl is 26 ml/min based on IBW   Goal of Therapy:  antiXa level = 0.6-1 units/ml (4hour after lovenox dose)    Plan:   Nicole Cella, RPh Clinical Pharmacist Pager: [phone] 03/28/2016, 8:03 PM   ANTICOAGULATION CONSULT NOTE - Follow Up Consult  Pharmacy Consult for  Switch from IV heparin to SQ Lovenox Indication:  R/o PE   Allergies  Allergen Reactions  . Bee Venom Anaphylaxis  . Shellfish Allergy Anaphylaxis  . Buprenorphine Hcl Other (See Comments)    Overly sedated  . Morphine And Related Other (See Comments)    Overly sedated    Patient Measurements: Height: _0  (165.1 cm) Weight: (!) 350 lb 1.6 oz (158.8 kg) IBW/kg (Calculated) : 57    Vital Signs: Temp: 98.4 F (36.9 C) (02/09 1930) Temp Source: Oral (02/09 1930) BP: 137/77 (02/09 1930) Pulse Rate: 104 (02/09 1818)  Labs:  Recent Labs  03/27/16 1850 03/27/16 1933 03/28/16 0432 03/28/16 0917 03/28/16 1426  HGB 7.8* 7.8*  --  7.6*  7.6*  --   HCT 24.8* 23.0*  --  24.0*  23.8*  --   PLT 252  --   --  201  206  --   HEPARINUNFRC  --   --  0.24*  --  0.30  CREATININE 2.38* 2.40*  --  2.14*  2.08*  --     Estimated Creatinine Clearance: 44.9 mL/min (by C-G formula based on SCr of 2.08 mg/dL (H)).   Assessment: 61yo female on heparin to r/o  VTE. Has been on IV heparin 1900 units/hr with therapeutic heparin level of 0.3 this afternoon.  MD now changing therapy to SQ Lovenox for treatment for PE until we can obtain VQ scan.  Obese 61 y.o female ARF(Baseline1.07 on 1/14)  SCr 2.08,  CrCl is 64m/min based on adjusted body wt 97kg .  Anemia: Hgb 7.8, Hgb was 8.7 on 1/14. Pltc wnl 206k   Goal of Therapy:  antiXa level = 0.6-1 units/ml (4hour after lovenox dose)  Monitor platelets by anticoagulation protocol: Yes   Plan:  Lovenox 160 mg SQ q12h  Monitor for bleeding, changes in renal function.    CArman Bogus2/10/2016,8:09 PM

## 2016-03-28 NOTE — Progress Notes (Signed)
ANTICOAGULATION CONSULT NOTE - Follow Up Consult  Pharmacy Consult for heparin Indication: r/o VTE  Labs:  Recent Labs  03/27/16 1850 03/27/16 1933 03/28/16 0432 03/28/16 0917 03/28/16 1426  HGB 7.8* 7.8*  --  7.6*  7.6*  --   HCT 24.8* 23.0*  --  24.0*  23.8*  --   PLT 252  --   --  201  206  --   HEPARINUNFRC  --   --  0.24*  --  0.30  CREATININE 2.38* 2.40*  --  2.14*  2.08*  --     Assessment: 61yo female on heparin to r/o VTE. Heparin level therapeutic at 0.30 on 1900 units/hr.   Goal of Therapy:  Heparin level 0.3-0.7 units/ml   Plan:  1. Increase heparin slightly to 2000 units/hr 2. Daily heparin level and CBC  Vincenza Hews, PharmD, BCPS 03/28/2016, 3:22 PM

## 2016-03-28 NOTE — Progress Notes (Signed)
[facility]  Patient Status: Active (receiving services up to time of hospitalization)  [facility] is providing the following services: RN and MSW  If patient discharges after hours, please call [phone].   [name] 03/28/2016, 11:15 AM

## 2016-03-28 NOTE — Progress Notes (Signed)
Patient took mask off and stated that she cannot tolerate CPAP. Patient placed on 2L nasal cannula.

## 2016-03-28 NOTE — Progress Notes (Signed)
Pharmacy Antibiotic Note  [name] is a 61 y.o. female admitted on 03/27/2016 with concern for pneumonia.  Pharmacy has been consulted for vancomycin dosing.  Pt in ARF, SCr 1.07 3wk ago, now 2.38.  Plan: Vancomycin 2556m x1 in ED then 17523mIV every 24 hours.  Goal trough 15-20 mcg/mL.  Change cefepime to 2g IV every 24 hours. Watch renal function and adjust if improves.  Weight: (!) 339 lb (153.8 kg)  Temp (24hrs), Avg:99.5 F (37.5 C), Min:99.5 F (37.5 C), Max:99.5 F (37.5 C)   Recent Labs Lab 03/27/16 1850 03/27/16 1933 03/27/16 2248  WBC 14.8*  --   --   CREATININE 2.38* 2.40*  --   LATICACIDVEN  --  2.56* 1.16    Estimated Creatinine Clearance: 38.1 mL/min (by C-G formula based on SCr of 2.4 mg/dL (H)).    Allergies  Allergen Reactions  . Bee Venom Anaphylaxis  . Shellfish Allergy Anaphylaxis  . Buprenorphine Hcl Other (See Comments)    Overly sedated  . Morphine And Related Other (See Comments)    Overly sedated     Thank you for allowing pharmacy to be a part of this patient's care.  [name], BCPS  03/28/2016 12:49 AM

## 2016-03-28 NOTE — Progress Notes (Signed)
ANTICOAGULATION CONSULT NOTE - Follow Up Consult  Pharmacy Consult for heparin Indication: r/o VTE  Labs:  Recent Labs  03/27/16 1850 03/27/16 1933 03/28/16 0432  HGB 7.8* 7.8*  --   HCT 24.8* 23.0*  --   PLT 252  --   --   HEPARINUNFRC  --   --  0.24*  CREATININE 2.38* 2.40*  --     Assessment: 60yo female subtherapeutic on heparin with initial dosing for possible VTE though lab was drawn just a few hours after bolus so steady-state level likely lower.  Goal of Therapy:  Heparin level 0.3-0.7 units/ml   Plan:  Will increase heparin gtt by 3 units/kgABW/hr to 1900 units/hr and check level in [place], PharmD, BCPS  03/28/2016,5:03 AM

## 2016-03-28 NOTE — Evaluation (Addendum)
Clinical/Bedside Swallow Evaluation Patient Details  Name: [name] MRN: [id number] Date of Birth: 1955/11/21  Today's Date: 03/28/2016 Time: SLP Start Time (ACUTE ONLY): 2 SLP Stop Time (ACUTE ONLY): 1528 SLP Time Calculation (min) (ACUTE ONLY): 18 min  Past Medical History:  Past Medical History:  Diagnosis Date  . Anemia   . Anxiety   . Arthritis    "pretty much all my joints"  . Asthma   . Benign paroxysmal positional vertigo 12/30/2012  . Breast cancer ([place]) 02/13/12   ruq  100'clock bx Ductal Carcinoma in Situ,(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  . Breast mass in female    L breast 2008, US showed likely fat necrosis  . Chronic lower back pain   . CKD (chronic kidney disease), stage III    "lower stage" (01/06/2014)  . Daily headache   . Depression   . GERD (gastroesophageal reflux disease)   . History of blood transfusion 2011   "after one of my OR's"  . History of hiatal hernia   . History of stomach ulcers   . HSV (herpes simplex virus) infection   . Hx of radiation therapy 05/05/12- 07/15/12   right breast, 45 gray x 25 fx, lumpectomy cavity boosted to 16.2 gray  . Hyperlipemia   . Hypertension    sees Dr. Criss Rosales , [place] [facility] [place]  . Hypothyroidism   . Kidney stones   . Knee pain, bilateral   . Migraines    "~ 3 times/month" (01/06/2014)  . Obesity   . OSA on CPAP    pt does not know settings  . Pancreatitis    hx of  . Pneumonia 1950's  . Polyneuropathy in diabetes(357.2)   . Schizophrenia ([place])   . 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 Tachycardia    with sx in 2008  . Thyroid disease   . Type II diabetes mellitus ([place])    Past Surgical History:  Past Surgical History:  Procedure Laterality Date  . ABDOMINAL HYSTERECTOMY  1979?   partial  . BREAST LUMPECTOMY  03/03/2012   Procedure: LUMPECTOMY;  Surgeon: Haywood Lasso, MD;  Location: [facility];  Service: General;  Laterality: Right;  . BREAST SURGERY Right 01/2012   "cancer"  . CHOLECYSTECTOMY   1980's?  . COLONOSCOPY WITH PROPOFOL N/A 09/28/2012   Procedure: COLONOSCOPY WITH PROPOFOL;  Surgeon: Lear Ng, MD;  Location: [facility];  Service: Endoscopy;  Laterality: N/A;  . FOOT FRACTURE SURGERY Right 1990's  . JOINT REPLACEMENT    . REVISION TOTAL KNEE ARTHROPLASTY  07/2009  . SHOULDER OPEN ROTATOR CUFF REPAIR Left 1990's  . THYROIDECTOMY  1970's  . TOTAL KNEE ARTHROPLASTY Bilateral 2009-06/2009   left; right   HPI:  61 year old female admitted 03/27/16 due to AMS, confusion. PMH significant for GERD, HLD, secondary Parksinsonism, scizophrenia, CKD3, HTN, OSA/obseity (339#), recurrent PNA, recurrent falls. CXR reveals subtle airspace opacities BLL L>R, PNA cannot be excluded.    Assessment / Plan / Recommendation Clinical Impression  Pt lethargic, requiring frequent verbal and tactile stim to maintain alertness. Positioning suboptimal, due to pt unable to be upright due to significant increase in back pain with movement. Pt exhibited no immediate cough response to po trials (thin, puree, cracker), however, several minutes after po trials ended, pt began to exhibit ongoing congested and intermittently productive cough.   Given body habitus and inability to be positioned upright, pt may be unable to tolerate MBS. When FEES was discussed with pt, she indicated she would  not tolerate that procedure either. Based on bedside presentation and multiple high risk indicators (recurrent PNA, secondary parkinsonism, GERD, hiatal hernia) oropharyngeal and/or esophageal dysphagia is highly suspect, and objective study is recommended prior to po intake. Will recheck pt at bedside tomorrow for decisions regarding which objective study to be done.     Aspiration Risk  Moderate aspiration risk    Diet Recommendation NPO   Medication Administration: Via alternative means    Other  Recommendations Recommended Consults: Consider esophageal assessment Oral Care Recommendations: Oral care BID    Follow up Recommendations  (TBD)      Frequency and Duration min 1 x/week  2 weeks       Prognosis Prognosis for Safe Diet Advancement: Fair Barriers to Reach Goals: Behavior      Swallow Study   General Date of Onset: 03/27/16 HPI: 61 year old female admitted 03/27/16 due to AMS, confusion. PMH significant for GERD, HLD, secondary Parksinsonism, scizophrenia, CKD3, HTN, OSA/obseity (339#), recurrent PNA, recurrent falls. CXR reveals subtle airspace opacities BLL L>R, PNA cannot be excluded.  Type of Study: Bedside Swallow Evaluation Previous Swallow Assessment: BSE September 2016 with recommendation for regular diet thin liquid. No objective study at that time. Diet Prior to this Study: NPO Temperature Spikes Noted: No Respiratory Status: Nasal cannula History of Recent Intubation: No Behavior/Cognition: Cooperative;Lethargic/Drowsy;Requires cueing Oral Cavity Assessment: Within Functional Limits Oral Care Completed by SLP: No Oral Cavity - Dentition: Adequate natural dentition Vision: Functional for self-feeding Self-Feeding Abilities: Needs set up;Needs assist Patient Positioning: Partially reclined (back pain prevented upright positioning) Baseline Vocal Quality: Normal Volitional Cough: Weak Volitional Swallow: Able to elicit    Oral/Motor/Sensory Function Overall Oral Motor/Sensory Function: Mild impairment (generalized weakness )   Ice Chips Ice chips: Not tested   Thin Liquid Thin Liquid: Impaired Presentation: Straw Oral Phase Functional Implications: Right anterior spillage Pharyngeal  Phase Impairments: Suspected delayed Swallow;Cough - Delayed;Decreased hyoid-laryngeal movement    Nectar Thick Nectar Thick Liquid: Not tested   Honey Thick Honey Thick Liquid: Not tested   Puree Puree: Impaired Presentation: Spoon Pharyngeal Phase Impairments: Cough - Delayed;Suspected delayed Swallow;Decreased hyoid-laryngeal movement   Solid   GO   Solid:  Impaired Pharyngeal Phase Impairments: Suspected delayed Swallow;Cough - Delayed;Decreased hyoid-laryngeal movement       [name] B. Quentin [facility], CCC-SLP [phone]  Shonna Chock 03/28/2016,3:28 PM

## 2016-03-28 NOTE — Progress Notes (Signed)
Patient placed on CPAP without complication. RT will monitor as needed.

## 2016-03-29 ENCOUNTER — Inpatient Hospital Stay (HOSPITAL_COMMUNITY): Payer: Medicare Other

## 2016-03-29 ENCOUNTER — Other Ambulatory Visit (HOSPITAL_COMMUNITY): Payer: Self-pay

## 2016-03-29 DIAGNOSIS — F201 Disorganized schizophrenia: Secondary | ICD-10-CM

## 2016-03-29 DIAGNOSIS — G894 Chronic pain syndrome: Secondary | ICD-10-CM

## 2016-03-29 DIAGNOSIS — J690 Pneumonitis due to inhalation of food and vomit: Secondary | ICD-10-CM

## 2016-03-29 DIAGNOSIS — E118 Type 2 diabetes mellitus with unspecified complications: Secondary | ICD-10-CM

## 2016-03-29 DIAGNOSIS — R1312 Dysphagia, oropharyngeal phase: Secondary | ICD-10-CM

## 2016-03-29 DIAGNOSIS — G9340 Encephalopathy, unspecified: Secondary | ICD-10-CM

## 2016-03-29 DIAGNOSIS — N179 Acute kidney failure, unspecified: Secondary | ICD-10-CM

## 2016-03-29 LAB — MAGNESIUM: Magnesium: 1.7 mg/dL (ref 1.7–2.4)

## 2016-03-29 LAB — CBC
HEMATOCRIT: 27 % — AB (ref 36.0–46.0)
Hemoglobin: 8.5 g/dL — ABNORMAL LOW (ref 12.0–15.0)
MCH: 26.2 pg (ref 26.0–34.0)
MCHC: 31.5 g/dL (ref 30.0–36.0)
MCV: 83.1 fL (ref 78.0–100.0)
Platelets: 224 10*3/uL (ref 150–400)
RBC: 3.25 MIL/uL — AB (ref 3.87–5.11)
RDW: 17 % — ABNORMAL HIGH (ref 11.5–15.5)
WBC: 10.9 10*3/uL — AB (ref 4.0–10.5)

## 2016-03-29 LAB — BASIC METABOLIC PANEL
ANION GAP: 8 (ref 5–15)
BUN: 25 mg/dL — ABNORMAL HIGH (ref 6–20)
CALCIUM: 9.5 mg/dL (ref 8.9–10.3)
CO2: 25 mmol/L (ref 22–32)
CREATININE: 1.1 mg/dL — AB (ref 0.44–1.00)
Chloride: 110 mmol/L (ref 101–111)
GFR, EST NON AFRICAN AMERICAN: 54 mL/min — AB (ref >60)
Glucose, Bld: 132 mg/dL — ABNORMAL HIGH (ref 65–99)
Potassium: 5 mmol/L (ref 3.5–5.1)
SODIUM: 143 mmol/L (ref 135–145)

## 2016-03-29 LAB — URINE CULTURE: CULTURE: NO GROWTH

## 2016-03-29 LAB — LEGIONELLA PNEUMOPHILA SEROGP 1 UR AG: L. pneumophila Serogp 1 Ur Ag: NEGATIVE

## 2016-03-29 LAB — GLUCOSE, CAPILLARY
GLUCOSE-CAPILLARY: 170 mg/dL — AB (ref 65–99)
GLUCOSE-CAPILLARY: 128 mg/dL — AB (ref 65–99)
GLUCOSE-CAPILLARY: 129 mg/dL — AB (ref 65–99)

## 2016-03-29 LAB — HEPARIN LEVEL (UNFRACTIONATED): Heparin Unfractionated: 0.29 IU/mL — ABNORMAL LOW (ref 0.30–0.70)

## 2016-03-29 MED ORDER — TECHNETIUM TC 99M DIETHYLENETRIAME-PENTAACETIC ACID
32.5000 | Freq: Once | INTRAVENOUS | Status: AC | PRN
  Administered 2016-03-29: 32.5 via RESPIRATORY_TRACT

## 2016-03-29 MED ORDER — IPRATROPIUM-ALBUTEROL 0.5-2.5 (3) MG/3ML IN SOLN: 3.0000 mL | RESPIRATORY_TRACT | Status: DC | PRN

## 2016-03-29 MED ORDER — TECHNETIUM TO 99M ALBUMIN AGGREGATED
4.3200 | Freq: Once | INTRAVENOUS | Status: AC | PRN
  Administered 2016-03-29: 4.32 via INTRAVENOUS

## 2016-03-29 NOTE — Progress Notes (Signed)
Speech Language Pathology     Patient Details Name: [name] MRN: [id number] DOB: [birth year] Today's Date: 03/29/2016 Time:  -     Pt requirs FEES vs MBS due to inability to sit at 90 degrees. Unable to complete FEES on weekends (Dr. Sherral Hammers aware per note). Spoke with RN. Will follow up Monday for FEES.            Houston Siren 03/29/2016, 4:36 PM  Orbie Pyo Colvin Caroli.[employer] [phone]

## 2016-03-29 NOTE — Progress Notes (Signed)
PROGRESS NOTE    [name]  KXF:[id number] DOB: Apr 10, 1955 DOA: 03/27/2016 PCP: [name], MD   Brief Narrative:  61 y.o. BF PMHx Schizophrenia, Anxiety,Depression, CKD stage III, HTN, HLD, Obesity, DM type II uncontrolled with complication, OSA/OHS, morbid obesity, obstructive sleep apnea, diagnosed 2008 Lt Breast cancer Ductal Carcinoma in Situ, S/P lumpectomy + XRT, HSV (herpes simplex virus) infection,Chronic lower back pain   she was recently hospitalized here for community-acquired pneumonia presenting with AMS.  She is unable to answer any questions and is unaccompanied.    HPI for Dr. Tyrone Nine:    [name] with a chief complaint of altered mental status. Family said that they've been acting more confused today. Having some mild cough and side pain. Patient is currently more alert than she had been earlier today. States that she has no issues with her side currently but has been coughing. Having some diffuse myalgias worse to the legs. Family denies any recent medication changes. Has been having multiple falls over the past couple days.  She was previously hospitalized: -1/12-15/18 for sepsis due to HCAP, acute hypoxic respiratory failure, slurred speech, and  AKI on CKD. -12/27-31/17 for sepsis due to multifocal PNA with mild AKI, metabolic encephalopathy; acute on chronic diastolic CHF exacerbation  ED Course: Per Dr. Tyrone Nine: [name] with multiple falls over past couple of days. Patient also has had some transient confusion. Was just in the hospital with hospital acquired pneumonia. Will repeat a chest x-ray obtain a UA. Check basic labs. CT the head with multiple falls recently as well. With generalized weakness will check a troponin. The patient had a reported blood pressure in the field and in the 70s. Here her blood pressure has been in the 696V systolic. Will give a liter of fluids. Send off for cultures.  Patient continues to have a soft blood pressure. Lactate very  minimally elevated. Chest x-ray concerning for possible pneumonia. Patient also has an acute kidney injury. Will give more fluids. Broadly cover with antibiotics as patient was just in the hospital. Discussed with hospitalist   Subjective: 2/10 A/O 4. Sitting in chair comfortably. Only concern is that she is hungry. States she had spoken with physician on last hospitalization 2 weeks ago and explained that she was choking on food. States no further workup was conducted.     Assessment & Plan:   Principal Problem:   Encephalopathy Active Problems:   Hypothyroidism   Anemia   Diabetes mellitus, type II, insulin dependent (HCC)   HCAP (healthcare-associated pneumonia)   Acute renal failure (ARF) (HCC)   Acute Encephalopathy -Differential is vast and includes infection (has HCAP, see below); PE; CVA; overdose; renal failure; metabolic encephalopathy. -CT head negative -Resolved -Patient taking large amounts of narcotics, benzodiazepines, muscle relaxant, antipsychotics which most likely is contributing to her encephalopathy.  Schizophrenia -See chronic pain and dysphagia   Chronic pain syndrome  -in order to avoid withdrawal will need to continue some patient's home medications at a lower dose  A. Ativan 1 mg QHS  B. Morphine 1 mg  q 6 hr PRN  C. Haldol IV 2 mg BID  D. hold Lyrica, Zanaflex, Geodon until patent passes swallow study.  Dysphagia -2/9 patient miserably failed bedside swallow. Speech will return tomorrow for further evaluation, appears by their note leaning toward a FEES which would not be accomplished until Monday.   HCAP/Aspiration Pneumonia/PE? -Given recent hospitalizations for CAP/HCAP in conjunction with abnormal CXR findings, will treat for HCAP. -  Most likely secondary to aspiration, secondary to medication overdose, and dysphagia -Cefepime and Vancomycin. Complete 5 day course antibiotics -D5-0.9% saline 119m/hr -DuoNeb QID -Flonase  -Flutter  Valve -Solu-Medrol 60 mg daily -Sputum cultures pending -Blood cultures NGTD -Strep pneumo/influenza negative -With multiple episodes of PNA in the last 6 weeks. VQ scan pending,  -Lower extremity Doppler negative  -Lovenox  ARF(Baseline1.07 on 1/14)  -Creatinine 2.40,  -Rehydrate and follow Lab Results  Component Value Date   CREATININE 2.08 (H) 03/28/2016   CREATININE 2.14 (H) 03/28/2016   CREATININE 2.40 (H) 03/27/2016   Anemia -Hgb 7.8, 8.7 on 1/14 -Will follow Recent Labs Lab 03/27/16 1850 03/27/16 1933 03/28/16 0917 03/29/16 0218  HGB 7.8* 7.8* 7.6*  7.6* 8.5*   Hypothyroidism -Low TSH in December -Will recheck TSH and free T4 at this time  DM type 2 controlled with complication -AT0A7.3 in May 2017, 6.1 in 12/17 -Will not recheck -Hold long-acting insulin for now, cover with SSI   DVT prophylaxis: Lovenox Code Status: Full Family Communication:  Disposition Plan: Resolution encephalopathy/pneumonia   Consultants:  Dr. JBalinda Quailsat [facility] (not consulted)    Procedures/Significant Events:  2/8 DG chest:Low lung volumes with borderline cardiomegaly and interstitial edema. Subtle airspace opacities at the lung bases left greater than right cannot exclude superimposed pneumonia 2/9 CT abdomen pelvis WO contrast:-Negative for nephrolithiasis hydronephrosis or ureteral stone. - Mild density in the left > right posterior lower lobes could reflect pneumonia or possible aspiration.   VENTILATOR SETTINGS: None   Cultures 2/8 blood NGTD 2/8 urine Negative 2/9 sputum pending  Antimicrobials: Anti-infectives    Start     Stop   03/29/16 0000  ceFEPIme (MAXIPIME) 2 g in dextrose 5 % 50 mL IVPB     04/04/16 2359   03/28/16 2200  vancomycin (VANCOCIN) 1,750 mg in sodium chloride 0.9 % 500 mL IVPB         03/28/16 2000  ceFEPIme (MAXIPIME) 2 g in dextrose 5 % 50 mL IVPB  Status:  Discontinued     03/28/16 1945   03/28/16 0100   ceFEPIme (MAXIPIME) 2 g in dextrose 5 % 50 mL IVPB     03/28/16 0211   03/27/16 2130  vancomycin (VANCOCIN) 2,500 mg in sodium chloride 0.9 % 500 mL IVPB     03/28/16 0125   03/27/16 2115  vancomycin (VANCOCIN) 3,076 mg in sodium chloride 0.9 % 500 mL IVPB  Status:  Discontinued     03/27/16 2109   03/27/16 2115  piperacillin-tazobactam (ZOSYN) IVPB 3.375 g     03/28/16 0013       Devices    LINES / TUBES:      Continuous Infusions: . dextrose 5 % and 0.9% NaCl 100 mL/hr at 03/28/16 2129     Objective: Vitals:   03/28/16 1930 03/29/16 0006 03/29/16 0345 03/29/16 0800  BP: 137/77 (!) 149/85 (!) 145/70 (!) 147/84  Pulse: (!) 102 99 96 77  Resp: _0 Temp: 98.4 F (36.9 C) 98.6 F (37 C) 98.2 F (36.8 C) 98.8 F (37.1 C)  TempSrc: Oral Oral Oral Oral  SpO2: 100% 94% 99% 98%  Weight:      Height:        Intake/Output Summary (Last 24 hours) at 03/29/16 0909 Last data filed at 03/29/16 0851  Gross per 24 hour  Intake           2536.3 ml  Output  4500 ml  Net          -1963.7 ml   Filed Weights   03/27/16 1608 03/28/16 0626  Weight: (!) 153.8 kg (339 lb) (!) 158.8 kg (350 lb 1.6 oz)    Examination:  General: A/O 4 positive acute respiratory distress Eyes: negative scleral hemorrhage, negative anisocoria, negative icterus ENT: Negative Runny nose, negative gingival bleeding, Neck:  Negative scars, masses, torticollis, lymphadenopathy, JVD Lungs: difficult to appreciate breath sounds secondary to body habitus. Mild expiratory wheezing.  Cardiovascular: Regular rate and rhythm without murmur gallop or rub normal S1 and S2 Abdomen: MORBIDLY OBESE, negative abdominal pain, nondistended, positive soft, bowel sounds, no rebound, no ascites, no appreciable mass Extremities: No significant cyanosis, clubbing, or edema bilateral lower extremities Skin: Negative rashes, lesions, ulcers Psychiatric:  Negative depression, negative anxiety, negative  fatigue, negative mania  Central nervous system:  Cranial nerves II through XII intact, tongue/uvula midline, all extremities muscle strength 5/5, sensation intact throughout,  negative dysarthria, negative expressive aphasia, negative receptive aphasia.  .     Data Reviewed: Care during the described time interval was provided by me .  I have reviewed this patient's available data, including medical history, events of note, physical examination, and all test results as part of my evaluation. I have personally reviewed and interpreted all radiology studies.  CBC:  Recent Labs Lab 03/27/16 1850 03/27/16 1933 03/28/16 0917 03/29/16 0218  WBC 14.8*  --  10.2  10.3 10.9*  NEUTROABS 12.2*  --  8.3*  --   HGB 7.8* 7.8* 7.6*  7.6* 8.5*  HCT 24.8* 23.0* 24.0*  23.8* 27.0*  MCV 82.7  --  82.8  82.6 83.1  PLT 252  --  201  206 597   Basic Metabolic Panel:  Recent Labs Lab 03/27/16 1850 03/27/16 1933 03/28/16 0917  NA 131* 131* 137  136  K 4.7 4.8 3.7  3.6  CL 93* 94* 98*  98*  CO2 26  --  30  28  GLUCOSE 123* 115* 124*  124*  BUN 44* 55* 42*  43*  CREATININE 2.38* 2.40* 2.14*  2.08*  CALCIUM 8.7*  --  8.9  8.9  MG  --   --  1.7   GFR: Estimated Creatinine Clearance: 44.9 mL/min (by C-G formula based on SCr of 2.08 mg/dL (H)). Liver Function Tests:  Recent Labs Lab 03/27/16 1850 03/28/16 0917  AST 32 16  ALT 18 17  ALKPHOS 57 55  BILITOT 0.8 0.5  PROT 5.7* 5.8*  ALBUMIN 3.1* 2.9*   No results for input(s): LIPASE, AMYLASE in the last 168 hours. No results for input(s): AMMONIA in the last 168 hours. Coagulation Profile: No results for input(s): INR, PROTIME in the last 168 hours. Cardiac Enzymes: No results for input(s): CKTOTAL, CKMB, CKMBINDEX, TROPONINI in the last 168 hours. BNP (last 3 results) No results for input(s): PROBNP in the last 8760 hours. HbA1C: No results for input(s): HGBA1C in the last 72 hours. CBG:  Recent Labs Lab  03/28/16 0746 03/28/16 1213 03/28/16 1655 03/28/16 2159 03/29/16 0805  GLUCAP 106* 121* 135* 131* 170*   Lipid Profile: No results for input(s): CHOL, HDL, LDLCALC, TRIG, CHOLHDL, LDLDIRECT in the last 72 hours. Thyroid Function Tests:  Recent Labs  03/28/16 0431  TSH 2.363  FREET4 0.80   Anemia Panel: No results for input(s): VITAMINB12, FOLATE, FERRITIN, TIBC, IRON, RETICCTPCT in the last 72 hours. Urine analysis:    Component Value Date/Time   COLORURINE YELLOW  03/27/2016 2020   APPEARANCEUR CLEAR 03/27/2016 2020   APPEARANCEUR Clear 11/13/2013 1310   LABSPEC 1.012 03/27/2016 2020   LABSPEC 1.017 11/13/2013 1310   PHURINE 5.0 03/27/2016 2020   GLUCOSEU NEGATIVE 03/27/2016 2020   GLUCOSEU Negative 11/13/2013 1310   GLUCOSEU NEG mg/dL 03/02/2006 2050   [place] 03/27/2016 2020   BILIRUBINUR NEGATIVE 03/27/2016 2020   BILIRUBINUR Negative 11/13/2013 [place] 03/27/2016 2020   PROTEINUR NEGATIVE 03/27/2016 2020   UROBILINOGEN 0.2 11/12/2014 2057   NITRITE NEGATIVE 03/27/2016 2020   LEUKOCYTESUR NEGATIVE 03/27/2016 2020   LEUKOCYTESUR Trace 11/13/2013 1310   Sepsis Labs: _0 (procalcitonin:4,lacticidven:4)  ) Recent Results (from the past 240 hour(s))  Blood Culture (routine x 2)     Status: None (Preliminary result)   Collection Time: 03/27/16  6:12 PM  Result Value Ref Range Status   Specimen Description BLOOD RIGHT ANTECUBITAL  Final   Special Requests BOTTLES DRAWN AEROBIC ONLY 10CC  Final   Culture NO GROWTH < 24 HOURS  Final   Report Status PENDING  Incomplete  Blood Culture (routine x 2)     Status: None (Preliminary result)   Collection Time: 03/27/16  6:50 PM  Result Value Ref Range Status   Specimen Description BLOOD RIGHT HAND  Final   Special Requests BOTTLES DRAWN AEROBIC AND ANAEROBIC 5CC  Final   Culture NO GROWTH < 24 HOURS  Final   Report Status PENDING  Incomplete  Urine culture     Status: None   Collection  Time: 03/27/16  8:20 PM  Result Value Ref Range Status   Specimen Description URINE, CATHETERIZED  Final   Special Requests NONE  Final   Culture NO GROWTH  Final   Report Status 03/29/2016 FINAL  Final  MRSA PCR Screening     Status: None   Collection Time: 03/28/16  6:30 AM  Result Value Ref Range Status   MRSA by PCR NEGATIVE NEGATIVE Final    Comment:        The GeneXpert MRSA Assay (FDA approved for NASAL specimens only), is one component of a comprehensive MRSA colonization surveillance program. It is not intended to diagnose MRSA infection nor to guide or monitor treatment for MRSA infections.          Radiology Studies: Ct Abdomen Pelvis Wo Contrast  Result Date: 03/28/2016 CLINICAL DATA:  Right flank pain EXAM: CT ABDOMEN AND PELVIS WITHOUT CONTRAST TECHNIQUE: Multidetector CT imaging of the abdomen and pelvis was performed following the standard protocol without IV contrast. COMPARISON:  06/23/2015 FINDINGS: Lower chest: Mild ground-glass densities in the left greater than right posterior lower lobes. No effusion. The heart appears borderline enlarged. Hepatobiliary: No focal liver abnormality is seen. Status post cholecystectomy. No biliary dilatation. Pancreas: Unremarkable. No pancreatic ductal dilatation or surrounding inflammatory changes. Spleen: Normal in size without focal abnormality. Adrenals/Urinary Tract: Adrenal glands are unremarkable. Kidneys are normal, without renal calculi, focal lesion, or hydronephrosis. Bladder is unremarkable. Nonspecific perinephric fat stranding. Stomach/Bowel: Stomach is within normal limits. Appendix contains a few stones but is otherwise normal. No evidence of bowel wall thickening, distention, or inflammatory changes. Large amount of stool in the right and transverse colon. Vascular/Lymphatic: Aortic atherosclerosis. No enlarged abdominal or pelvic lymph nodes. Reproductive: Status post hysterectomy. No adnexal masses. Other: Small  fat containing umbilical hernia. No free air or free fluid. Musculoskeletal: Degenerative changes. No acute or suspicious bone lesions. IMPRESSION: 1. Negative for nephrolithiasis hydronephrosis or ureteral stone. 2. Mild density in  the left greater than right posterior lower lobes could reflect pneumonia or possible aspiration. Electronically Signed   By: Donavan Foil M.D.   On: 03/28/2016 01:13   Dg Chest 2 View  Result Date: 03/27/2016 CLINICAL DATA:  Sepsis EXAM: CHEST  2 VIEW COMPARISON:  02/29/2016 FINDINGS: Low lung volumes. Borderline cardiomegaly. No aortic aneurysm. Mild bilateral interstitial edema. Possible superimposed pneumonia at each lung base left greater than right given slightly more confluent airspace opacities noted. Surgical clips are seen at the base the neck as before. No sizable effusion. No pneumothorax identified. No acute osseous abnormality. Small calcification about the right shoulder may reflect calcific tendinopathy. IMPRESSION: Low lung volumes with borderline cardiomegaly and interstitial edema. Subtle airspace opacities at the lung bases left greater than right cannot exclude superimposed pneumonia. Electronically Signed   By: Ashley Royalty M.D.   On: 03/27/2016 18:11   Ct Head Wo Contrast  Result Date: 03/27/2016 CLINICAL DATA:  Multiple falls. EXAM: CT HEAD WITHOUT CONTRAST TECHNIQUE: Contiguous axial images were obtained from the base of the skull through the vertex without intravenous contrast. COMPARISON:  02/29/2016 FINDINGS: Brain: There is no evidence for acute hemorrhage, hydrocephalus, mass lesion, or abnormal extra-axial fluid collection. No definite CT evidence for acute infarction. Vascular: No hyperdense vessel or unexpected calcification. Skull: Normal. Negative for fracture or focal lesion. Sinuses/Orbits: Visualized portions of the paranasal sinuses are clear. Right mastoid air cells are clear. Fluid is visible in the left mastoid air cells. Visualized  portions of the globes and intraorbital fat are unremarkable. Other: None. IMPRESSION: 1. No acute intracranial abnormality. 2. Left mastoid effusion. Electronically Signed   By: Misty Stanley M.D.   On: 03/27/2016 17:50        Scheduled Meds: . ceFEPime (MAXIPIME) IV  2 g Intravenous Q24H  . chlorhexidine  15 mL Mouth Rinse BID  . enoxaparin (LOVENOX) injection  1 mg/kg Subcutaneous Q12H  . haloperidol lactate  1 mg Intravenous Q12H  . insulin aspart  0-20 Units Subcutaneous TID WC  . LORazepam  1 mg Intravenous QHS  . mouth rinse  15 mL Mouth Rinse BID  . mouth rinse  15 mL Mouth Rinse q12n4p  . methylPREDNISolone (SOLU-MEDROL) injection  60 mg Intravenous Q24H  . sodium chloride flush  3 mL Intravenous Q12H  . vancomycin  1,750 mg Intravenous Q24H   Continuous Infusions: . dextrose 5 % and 0.9% NaCl 100 mL/hr at 03/28/16 2129     LOS: 2 days    Time spent: 40 minutes    WOODS, Geraldo Docker, MD [facility] Pager [phone]   If 7PM-7AM, please contact night-coverage [email or web address] Password [email or web address] 03/29/2016, 9:09 AM

## 2016-03-30 ENCOUNTER — Inpatient Hospital Stay (HOSPITAL_COMMUNITY): Payer: Medicare Other

## 2016-03-30 DIAGNOSIS — F209 Schizophrenia, unspecified: Secondary | ICD-10-CM

## 2016-03-30 DIAGNOSIS — E038 Other specified hypothyroidism: Secondary | ICD-10-CM

## 2016-03-30 DIAGNOSIS — R4182 Altered mental status, unspecified: Secondary | ICD-10-CM

## 2016-03-30 DIAGNOSIS — R0600 Dyspnea, unspecified: Secondary | ICD-10-CM

## 2016-03-30 DIAGNOSIS — R400 Somnolence: Secondary | ICD-10-CM

## 2016-03-30 LAB — GLUCOSE, CAPILLARY
GLUCOSE-CAPILLARY: 196 mg/dL — AB (ref 65–99)
GLUCOSE-CAPILLARY: 198 mg/dL — AB (ref 65–99)
Glucose-Capillary: 216 mg/dL — ABNORMAL HIGH (ref 65–99)
Glucose-Capillary: 170 mg/dL — ABNORMAL HIGH (ref 65–99)
Glucose-Capillary: 176 mg/dL — ABNORMAL HIGH (ref 65–99)

## 2016-03-30 LAB — COMPREHENSIVE METABOLIC PANEL
ALK PHOS: 59 U/L (ref 38–126)
ALT: 17 U/L (ref 14–54)
AST: 20 U/L (ref 15–41)
Albumin: 3.2 g/dL — ABNORMAL LOW (ref 3.5–5.0)
Anion gap: 9 (ref 5–15)
BILIRUBIN TOTAL: 0.7 mg/dL (ref 0.3–1.2)
BUN: 23 mg/dL — ABNORMAL HIGH (ref 6–20)
CALCIUM: 9.2 mg/dL (ref 8.9–10.3)
CO2: 24 mmol/L (ref 22–32)
CREATININE: 1.12 mg/dL — AB (ref 0.44–1.00)
Chloride: 112 mmol/L — ABNORMAL HIGH (ref 101–111)
GFR, EST NON AFRICAN AMERICAN: 53 mL/min — AB (ref >60)
Glucose, Bld: 205 mg/dL — ABNORMAL HIGH (ref 65–99)
Potassium: 4 mmol/L (ref 3.5–5.1)
Sodium: 145 mmol/L (ref 135–145)
TOTAL PROTEIN: 6.3 g/dL — AB (ref 6.5–8.1)

## 2016-03-30 LAB — MAGNESIUM: MAGNESIUM: 1.6 mg/dL — AB (ref 1.7–2.4)

## 2016-03-30 LAB — ECHOCARDIOGRAM COMPLETE
Height: 65 in
Weight: 5601.6 oz

## 2016-03-30 MED ORDER — MAGNESIUM SULFATE 2 GM/50ML IV SOLN
2.0000 g | Freq: Once | INTRAVENOUS | Status: AC
  Administered 2016-03-30: 2 g via INTRAVENOUS
  Filled 2016-03-30: qty 50

## 2016-03-30 NOTE — Progress Notes (Signed)
PROGRESS NOTE    [name]  EXH:[id number] DOB: 24-Jan-1956 DOA: 03/27/2016 PCP: [name], MD   Brief Narrative:  61 y.o. BF PMHx Schizophrenia, Anxiety,Depression, CKD stage III, HTN, HLD, Obesity, DM type II uncontrolled with complication, OSA/OHS, morbid obesity, obstructive sleep apnea, diagnosed 2008 Lt Breast cancer Ductal Carcinoma in Situ, S/P lumpectomy + XRT, HSV (herpes simplex virus) infection,Chronic lower back pain   she was recently hospitalized here for community-acquired pneumonia presenting with AMS.  She is unable to answer any questions and is unaccompanied.    HPI for Dr. Tyrone Nine:    [name] with a chief complaint of altered mental status. Family said that they've been acting more confused today. Having some mild cough and side pain. Patient is currently more alert than she had been earlier today. States that she has no issues with her side currently but has been coughing. Having some diffuse myalgias worse to the legs. Family denies any recent medication changes. Has been having multiple falls over the past couple days.  She was previously hospitalized: -1/12-15/18 for sepsis due to HCAP, acute hypoxic respiratory failure, slurred speech, and  AKI on CKD. -12/27-31/17 for sepsis due to multifocal PNA with mild AKI, metabolic encephalopathy; acute on chronic diastolic CHF exacerbation  ED Course: Per Dr. Tyrone Nine: [name] with multiple falls over past couple of days. Patient also has had some transient confusion. Was just in the hospital with hospital acquired pneumonia. Will repeat a chest x-ray obtain a UA. Check basic labs. CT the head with multiple falls recently as well. With generalized weakness will check a troponin. The patient had a reported blood pressure in the field and in the 70s. Here her blood pressure has been in the 381O systolic. Will give a liter of fluids. Send off for cultures.  Patient continues to have a soft blood pressure. Lactate very  minimally elevated. Chest x-ray concerning for possible pneumonia. Patient also has an acute kidney injury. Will give more fluids. Broadly cover with antibiotics as patient was just in the hospital. Discussed with hospitalist   Subjective: 2/11  A/O 4. Laying in [place] comfortably. Only concern is that she is hungry. States she had spoken with physician on last hospitalization 2 weeks ago and explained that she was choking on food. States no further workup was conducted.     Assessment & Plan:   Principal Problem:   Encephalopathy Active Problems:   Hypothyroidism   Anemia   Diabetes mellitus, type II, insulin dependent ([place])   HCAP (healthcare-associated pneumonia)   Acute renal failure (ARF) ([place])   Disorganized schizophrenia ([place])   Chronic pain syndrome   Acute renal failure ([place])   Controlled diabetes mellitus type 2 with complications ([facility])   Acute encephalopathy   Oropharyngeal dysphagia   Aspiration pneumonia of right lower lobe due to vomit ([place])   Acute Encephalopathy -Multifactorial (has HCAP, see below); PE; CVA; overdose; renal failure; metabolic encephalopathy. -CT head negative -Resolved -Patient taking large amounts of narcotics, benzodiazepines, muscle relaxant, antipsychotics which most likely is contributing to her encephalopathy.  Schizophrenia -See chronic pain and dysphagia   Chronic pain syndrome  -in order to avoid withdrawal will need to continue some patient's home medications at a lower dose  A. Ativan 1 mg QHS  B. Morphine 1 mg  q 6 hr PRN  C. Haldol IV 2 mg BID  D. hold Lyrica, Zanaflex, Geodon until patent passes swallow study.  Dysphagia -2/9 patient miserably failed bedside  swallow. Speech will return tomorrow for further evaluation, appears by their note leaning toward a FEES which would not be accomplished until Monday.   HCAP/Aspiration Pneumonia/PE? -Given recent hospitalizations for CAP treat for HCAP. -Most likely secondary to  aspiration, secondary to medication overdose, and dysphagia -Cefepime and Vancomycin. Complete 5 day course antibiotics -D5-0.9% saline 146m/hr -DuoNeb QID -Flonase  -Flutter Valve -Solu-Medrol 60 mg daily -Sputum cultures pending -Blood cultures NGTD -Strep pneumo/influenza negative -With multiple episodes of PNA in the last 6 weeks. VQ scan pending,  -Lower extremity Doppler negative -VQ scan negative   ARF(Baseline1.07 on 1/14)  -Creatinine 2.40,  -Rehydrate and follow Lab Results  Component Value Date   CREATININE 1.10 (H) 03/29/2016   CREATININE 2.08 (H) 03/28/2016   CREATININE 2.14 (H) 03/28/2016  -within normal limits with hydration  Anemia -Hgb 7.8, 8.7 on 1/14 -Will follow  Recent Labs Lab 03/27/16 1850 03/27/16 1933 03/28/16 0917 03/29/16 0218  HGB 7.8* 7.8* 7.6*  7.6* 8.5*   Hypothyroidism -Low TSH in December -Will recheck TSH and free T4 at this time  DM type 2 controlled with complication -AT0G7.3 in May 2017, 6.1 in 12/17 -Will not recheck -Hold long-acting insulin for now, cover with SSI  Hypomagnesemia -Magnesium IV 2 g      DVT prophylaxis: Lovenox Code Status: Full Family Communication:  Disposition Plan: Resolution encephalopathy/pneumonia   Consultants:  Dr. JBalinda Quailsat [facility] (not consulted)    Procedures/Significant Events:  2/8 DG chest:Low lung volumes with borderline cardiomegaly and interstitial edema. Subtle airspace opacities at the lung bases left greater than right cannot exclude superimposed pneumonia 2/9 CT abdomen pelvis WO contrast:-Negative for nephrolithiasis hydronephrosis or ureteral stone. - Mild density in the left > right posterior lower lobes could reflect pneumonia or possible aspiration. 2/10 VQ scan: Negative for PE   VENTILATOR SETTINGS: None   Cultures 2/8 blood NGTD 2/8 urine Negative 2/9 sputum pending  Antimicrobials: Anti-infectives    Start     Stop    03/29/16 0000  ceFEPIme (MAXIPIME) 2 g in dextrose 5 % 50 mL IVPB     04/04/16 2359   03/28/16 2200  vancomycin (VANCOCIN) 1,750 mg in sodium chloride 0.9 % 500 mL IVPB         03/28/16 2000  ceFEPIme (MAXIPIME) 2 g in dextrose 5 % 50 mL IVPB  Status:  Discontinued     03/28/16 1945   03/28/16 0100  ceFEPIme (MAXIPIME) 2 g in dextrose 5 % 50 mL IVPB     03/28/16 0211   03/27/16 2130  vancomycin (VANCOCIN) 2,500 mg in sodium chloride 0.9 % 500 mL IVPB     03/28/16 0125   03/27/16 2115  vancomycin (VANCOCIN) 3,076 mg in sodium chloride 0.9 % 500 mL IVPB  Status:  Discontinued     03/27/16 2109   03/27/16 2115  piperacillin-tazobactam (ZOSYN) IVPB 3.375 g     03/28/16 0013       Devices    LINES / TUBES:      Continuous Infusions: . dextrose 5 % and 0.9% NaCl 100 mL/hr at 03/28/16 2129     Objective: Vitals:   03/29/16 1816 03/29/16 2000 03/30/16 0000 03/30/16 0403  BP: 133/64 (!) 142/78 (!) 151/77 (!) 152/82  Pulse: 98 86 86 98  Resp:  16 (!) 24 (!) 31  Temp: 98.3 F (36.8 C) 99.6 F (37.6 C) 98.5 F (36.9 C) 98.3 F (36.8 C)  TempSrc: Oral Oral Oral Oral  SpO2:  99% 97% 95%  Weight:      Height:        Intake/Output Summary (Last 24 hours) at 03/30/16 5638 Last data filed at 03/30/16 0732  Gross per 24 hour  Intake          3106.33 ml  Output                0 ml  Net          3106.33 ml   Filed Weights   03/27/16 1608 03/28/16 0626  Weight: (!) 153.8 kg (339 lb) (!) 158.8 kg (350 lb 1.6 oz)    Examination:  General: A/O 4 positive acute respiratory distress Eyes: negative scleral hemorrhage, negative anisocoria, negative icterus ENT: Negative Runny nose, negative gingival bleeding, Neck:  Negative scars, masses, torticollis, lymphadenopathy, JVD Lungs: difficult to appreciate breath sounds secondary to body habitus. Mild expiratory wheezing.  Cardiovascular: Regular rate and rhythm without murmur gallop or rub normal S1 and S2 Abdomen: MORBIDLY  OBESE, negative abdominal pain, nondistended, positive soft, bowel sounds, no rebound, no ascites, no appreciable mass Extremities: No significant cyanosis, clubbing, or edema bilateral lower extremities Skin: Negative rashes, lesions, ulcers Psychiatric:  Negative depression, negative anxiety, negative fatigue, negative mania  Central nervous system:  Cranial nerves II through XII intact, tongue/uvula midline, all extremities muscle strength 5/5, sensation intact throughout,  negative dysarthria, negative expressive aphasia, negative receptive aphasia.  .     Data Reviewed: Care during the described time interval was provided by me .  I have reviewed this patient's available data, including medical history, events of note, physical examination, and all test results as part of my evaluation. I have personally reviewed and interpreted all radiology studies.  CBC:  Recent Labs Lab 03/27/16 1850 03/27/16 1933 03/28/16 0917 03/29/16 0218  WBC 14.8*  --  10.2  10.3 10.9*  NEUTROABS 12.2*  --  8.3*  --   HGB 7.8* 7.8* 7.6*  7.6* 8.5*  HCT 24.8* 23.0* 24.0*  23.8* 27.0*  MCV 82.7  --  82.8  82.6 83.1  PLT 252  --  201  206 756   Basic Metabolic Panel:  Recent Labs Lab 03/27/16 1850 03/27/16 1933 03/28/16 0917 03/29/16 1531  NA 131* 131* 137  136 143  K 4.7 4.8 3.7  3.6 5.0  CL 93* 94* 98*  98* 110  CO2 26  --  _0 GLUCOSE 123* 115* 124*  124* 132*  BUN 44* 55* 42*  43* 25*  CREATININE 2.38* 2.40* 2.14*  2.08* 1.10*  CALCIUM 8.7*  --  8.9  8.9 9.5  MG  --   --  1.7 1.7   GFR: Estimated Creatinine Clearance: 84.9 mL/min (by C-G formula based on SCr of 1.1 mg/dL (H)). Liver Function Tests:  Recent Labs Lab 03/27/16 1850 03/28/16 0917  AST 32 16  ALT 18 17  ALKPHOS 57 55  BILITOT 0.8 0.5  PROT 5.7* 5.8*  ALBUMIN 3.1* 2.9*   No results for input(s): LIPASE, AMYLASE in the last 168 hours. No results for input(s): AMMONIA in the last 168  hours. Coagulation Profile: No results for input(s): INR, PROTIME in the last 168 hours. Cardiac Enzymes: No results for input(s): CKTOTAL, CKMB, CKMBINDEX, TROPONINI in the last 168 hours. BNP (last 3 results) No results for input(s): PROBNP in the last 8760 hours. HbA1C: No results for input(s): HGBA1C in the last 72 hours. CBG:  Recent Labs Lab 03/29/16 0805  03/29/16 1810 03/29/16 1940 03/30/16 0624 03/30/16 0902  GLUCAP 170* 128* 129* 216* 196*   Lipid Profile: No results for input(s): CHOL, HDL, LDLCALC, TRIG, CHOLHDL, LDLDIRECT in the last 72 hours. Thyroid Function Tests:  Recent Labs  03/28/16 0431  TSH 2.363  FREET4 0.80   Anemia Panel: No results for input(s): VITAMINB12, FOLATE, FERRITIN, TIBC, IRON, RETICCTPCT in the last 72 hours. Urine analysis:    Component Value Date/Time   COLORURINE YELLOW 03/27/2016 2020   APPEARANCEUR CLEAR 03/27/2016 2020   APPEARANCEUR Clear 11/13/2013 1310   LABSPEC 1.012 03/27/2016 2020   LABSPEC 1.017 11/13/2013 1310   PHURINE 5.0 03/27/2016 2020   GLUCOSEU NEGATIVE 03/27/2016 2020   GLUCOSEU Negative 11/13/2013 1310   GLUCOSEU NEG mg/dL 03/02/2006 2050   HGBUR NEGATIVE 03/27/2016 2020   BILIRUBINUR NEGATIVE 03/27/2016 2020   BILIRUBINUR Negative 11/13/2013 [place] 03/27/2016 2020   PROTEINUR NEGATIVE 03/27/2016 2020   UROBILINOGEN 0.2 11/12/2014 2057   NITRITE NEGATIVE 03/27/2016 2020   LEUKOCYTESUR NEGATIVE 03/27/2016 2020   LEUKOCYTESUR Trace 11/13/2013 1310   Sepsis Labs: _0 (procalcitonin:4,lacticidven:4)  ) Recent Results (from the past 240 hour(s))  Blood Culture (routine x 2)     Status: None (Preliminary result)   Collection Time: 03/27/16  6:12 PM  Result Value Ref Range Status   Specimen Description BLOOD RIGHT ANTECUBITAL  Final   Special Requests BOTTLES DRAWN AEROBIC ONLY 10CC  Final   Culture NO GROWTH 2 DAYS  Final   Report Status PENDING  Incomplete  Blood Culture  (routine x 2)     Status: None (Preliminary result)   Collection Time: 03/27/16  6:50 PM  Result Value Ref Range Status   Specimen Description BLOOD RIGHT HAND  Final   Special Requests BOTTLES DRAWN AEROBIC AND ANAEROBIC 5CC  Final   Culture NO GROWTH 2 DAYS  Final   Report Status PENDING  Incomplete  Urine culture     Status: None   Collection Time: 03/27/16  8:20 PM  Result Value Ref Range Status   Specimen Description URINE, CATHETERIZED  Final   Special Requests NONE  Final   Culture NO GROWTH  Final   Report Status 03/29/2016 FINAL  Final  MRSA PCR Screening     Status: None   Collection Time: 03/28/16  6:30 AM  Result Value Ref Range Status   MRSA by PCR NEGATIVE NEGATIVE Final    Comment:        The GeneXpert MRSA Assay (FDA approved for NASAL specimens only), is one component of a comprehensive MRSA colonization surveillance program. It is not intended to diagnose MRSA infection nor to guide or monitor treatment for MRSA infections.          Radiology Studies: Dg Chest 2 View  Result Date: 03/29/2016 CLINICAL DATA:  Difficulty breathing, hypoxia EXAM: CHEST  2 VIEW COMPARISON:  03/27/2016 FINDINGS: Increased interstitial markings. Mild patchy lingular/left lower lobe opacity, pneumonia not excluded. No pleural effusion or pneumothorax. The heart is top-normal in size. Surgical clips overlying the right neck. IMPRESSION: Mild patchy lingular/left lower lobe opacity, pneumonia not excluded. Increased interstitial markings without frank interstitial edema. Electronically Signed   By: Julian Hy M.D.   On: 03/29/2016 17:54   Nm Pulmonary Perf And Vent  Result Date: 03/29/2016 CLINICAL DATA:  Acute respiratory failure, hypoxia, cough EXAM: NUCLEAR MEDICINE VENTILATION - PERFUSION LUNG SCAN TECHNIQUE: Ventilation images were obtained in multiple projections using inhaled aerosol Tc-89mDTPA. Perfusion images were obtained  in multiple projections after intravenous  injection of Tc-12mMAA. RADIOPHARMACEUTICALS:  32.5 mCi Technetium-964mTPA aerosol inhalation and 4.32 mCi Technetium-9973mA IV COMPARISON:  None. FINDINGS: Ventilation: Mildly heterogeneous.  No focal ventilation defect. Perfusion: No wedge shaped peripheral perfusion defects to suggest acute pulmonary embolism. IMPRESSION: Negative for pulmonary embolism. Electronically Signed   By: SriJulian HyD.   On: 03/29/2016 17:50        Scheduled Meds: . ceFEPime (MAXIPIME) IV  2 g Intravenous Q24H  . chlorhexidine  15 mL Mouth Rinse BID  . enoxaparin (LOVENOX) injection  1 mg/kg Subcutaneous Q12H  . haloperidol lactate  1 mg Intravenous Q12H  . insulin aspart  0-20 Units Subcutaneous TID WC  . LORazepam  1 mg Intravenous QHS  . mouth rinse  15 mL Mouth Rinse BID  . mouth rinse  15 mL Mouth Rinse q12n4p  . methylPREDNISolone (SOLU-MEDROL) injection  60 mg Intravenous Q24H  . sodium chloride flush  3 mL Intravenous Q12H  . vancomycin  1,750 mg Intravenous Q24H   Continuous Infusions: . dextrose 5 % and 0.9% NaCl 100 mL/hr at 03/28/16 2129     LOS: 3 days    Time spent: 40 minutes    Jeovanni Heuring, CURGeraldo DockerD [facility] Pager [phone] 7PM-7AM, please contact night-coverage [email or web address] Password [email or web address]11/2018, 9:18 AM

## 2016-03-30 NOTE — Progress Notes (Signed)
Pt placed on cpap; tolerating well at this time.

## 2016-03-31 LAB — GLUCOSE, CAPILLARY
GLUCOSE-CAPILLARY: 227 mg/dL — AB (ref 65–99)
GLUCOSE-CAPILLARY: 222 mg/dL — AB (ref 65–99)
Glucose-Capillary: 217 mg/dL — ABNORMAL HIGH (ref 65–99)
Glucose-Capillary: 170 mg/dL — ABNORMAL HIGH (ref 65–99)

## 2016-03-31 LAB — CBC
HCT: 27.8 % — ABNORMAL LOW (ref 36.0–46.0)
Hemoglobin: 8.7 g/dL — ABNORMAL LOW (ref 12.0–15.0)
MCH: 26.1 pg (ref 26.0–34.0)
MCHC: 31.3 g/dL (ref 30.0–36.0)
MCV: 83.5 fL (ref 78.0–100.0)
PLATELETS: 217 10*3/uL (ref 150–400)
RBC: 3.33 MIL/uL — ABNORMAL LOW (ref 3.87–5.11)
RDW: 17.2 % — AB (ref 11.5–15.5)
WBC: 11.3 10*3/uL — AB (ref 4.0–10.5)

## 2016-03-31 LAB — CREATININE, SERUM
Creatinine, Ser: 1.08 mg/dL — ABNORMAL HIGH (ref 0.44–1.00)
GFR calc non Af Amer: 55 mL/min — ABNORMAL LOW (ref >60)

## 2016-03-31 MED ORDER — ENOXAPARIN SODIUM 80 MG/0.8ML ~~LOC~~ SOLN
80.0000 mg | SUBCUTANEOUS | Status: DC
  Administered 2016-04-01 – 2016-04-06 (×6): 80 mg via SUBCUTANEOUS
  Filled 2016-03-31 (×5): qty 0.8

## 2016-03-31 MED ORDER — FUROSEMIDE 40 MG PO TABS: 40.0000 mg | ORAL_TABLET | Freq: Every day | ORAL | Status: DC

## 2016-03-31 MED ORDER — CARVEDILOL 3.125 MG PO TABS
3.1250 mg | ORAL_TABLET | Freq: Two times a day (BID) | ORAL | Status: DC
  Administered 2016-03-31 – 2016-04-07 (×14): 3.125 mg via ORAL
  Filled 2016-03-31 (×14): qty 1

## 2016-03-31 MED ORDER — LORAZEPAM 1 MG PO TABS
1.0000 mg | ORAL_TABLET | Freq: Every day | ORAL | Status: DC
  Administered 2016-03-31 – 2016-04-03 (×3): 1 mg via ORAL
  Filled 2016-03-31 (×3): qty 1

## 2016-03-31 MED ORDER — FUROSEMIDE 40 MG PO TABS
40.0000 mg | ORAL_TABLET | Freq: Every day | ORAL | Status: DC
  Administered 2016-04-02 – 2016-04-07 (×6): 40 mg via ORAL
  Filled 2016-03-31 (×7): qty 1

## 2016-03-31 MED ORDER — POLYETHYLENE GLYCOL 3350 17 G PO PACK
17.0000 g | PACK | Freq: Every day | ORAL | Status: DC
  Administered 2016-04-04 – 2016-04-06 (×3): 17 g via ORAL
  Filled 2016-03-31 (×6): qty 1

## 2016-03-31 MED ORDER — LEVOTHYROXINE SODIUM 75 MCG PO TABS
150.0000 ug | ORAL_TABLET | Freq: Every day | ORAL | Status: DC
Start: 2016-04-01 — End: 2016-04-07
  Administered 2016-04-01 – 2016-04-07 (×7): 150 ug via ORAL
  Filled 2016-03-31 (×8): qty 2

## 2016-03-31 MED ORDER — DEXTROSE 5 % IV SOLN
2.0000 g | Freq: Two times a day (BID) | INTRAVENOUS | Status: AC
  Administered 2016-03-31 (×2): 2 g via INTRAVENOUS
  Filled 2016-03-31 (×2): qty 2

## 2016-03-31 MED ORDER — HYDRALAZINE HCL 20 MG/ML IJ SOLN
5.0000 mg | Freq: Once | INTRAMUSCULAR | Status: AC
  Administered 2016-03-31: 5 mg via INTRAVENOUS
  Filled 2016-03-31: qty 1

## 2016-03-31 MED ORDER — SODIUM CHLORIDE 0.9 % IV BOLUS (SEPSIS)
500.0000 mL | Freq: Once | INTRAVENOUS | Status: AC
  Administered 2016-03-31: 500 mL via INTRAVENOUS

## 2016-03-31 MED ORDER — DOCUSATE SODIUM 100 MG PO CAPS
200.0000 mg | ORAL_CAPSULE | Freq: Every day | ORAL | Status: DC
  Administered 2016-03-31 – 2016-04-06 (×6): 200 mg via ORAL
  Filled 2016-03-31 (×6): qty 2

## 2016-03-31 NOTE — Progress Notes (Signed)
[place] TEAM 1 - Stepdown/ICU TEAM  ICA DAYE  OFH:[id number] DOB: [birth year] DOA: 03/27/2016 PCP: [name], MD    Brief Narrative:  61 y.o.F Hx Schizophrenia, Anxiety, Depression, CKD stage III, HTN, HLD, Obesity, DM2, OSA/OHS, morbid obesity, Lt Breast cancer Ductal Carcinoma in Situ 2008 S/P lumpectomy + XRT, HSV, and Chronic lower back pain who was recently hospitalized here for community-acquired pneumonia and returned to the the hospital with AMS.   Subjective: The patient is alert and conversant but very slow to answer questions and does not appear to be fully oriented.  She denies any complaints at this time specifically denying chest pain shortness breath fevers chills nausea or vomiting.  Assessment & Plan:  Acute Encephalopathy -CT head negative  -taking large amounts of narcotics, benzodiazepines, muscle relaxant, antipsychotics at home which is most likely the cause of her encephalopathy -simplifying her medication regimen as able   Schizophrenia -no evidence of psychosis at present    Chronic pain syndrome  -in order to avoid withdrawal will need to continue some of the patient's home medications at a lower dose             A. Ativan 1 mg QHS             B. Morphine 1 mg  q 6 hr PRN             C. Haldol IV 2 mg BID             D. hold Lyrica, Zanaflex, Geodon   Dysphagia -2/9 failed bedside swallow but w/ FEES 2/12 was able to be cleared for diet  Pneumonia/pneumonitis - ?Aspiration -likely secondary to aspiration, secondary to polypharmacy and AMS -Strep pneumo/influenza negative -Lower extremity Doppler negative -VQ scan negative -stop abx after 5 days of tx and follow - wean to RA as able   Acute renal failure -baseline1.07 on 1/14 - crt now back to baseline   Recent Labs Lab 03/27/16 1933 03/28/16 0917 03/29/16 1531 03/30/16 1113 03/31/16 0531  CREATININE 2.40* 2.14*  2.08* 1.10* 1.12* 1.08*   Anemia -Hgb 8.7 on 1/14 - now  stable at baseline - no evidence of blood loss - check anemia panel   Recent Labs Lab 03/27/16 1850 03/27/16 1933 03/28/16 0917 03/29/16 0218 03/31/16 0531  HGB 7.8* 7.8* 7.6*  7.6* 8.5* 8.7*    Hypothyroidism -Low TSH in December -TSH and free T4 both within reasonable range   DM 2 controlled with complication -P4D 6.1 82/64 - CBG variable - stop dextrose IVF and follow   Hypomagnesemia Recheck in AM  Morbid obesity - Body mass index is 55.69 kg/m.   DVT prophylaxis: lovenox Code Status: FULL CODE Family Communication: no family present at time of exam  Disposition Plan: stable for transfer to tele bed - begin PT/OT - follow mental status   Consultants:  none  Procedures: 2/9 CT abdomen pelvis WO contrast:-Negative for nephrolithiasis hydronephrosis or ureteral stone. - Mild density in the left > right posterior lower lobes could reflect pneumonia or possible aspiration. 2/10 VQ scan: Negative for PE  Antimicrobials:  Cefepime 2/8 > Vancomycin 2/8 > 2/12  Objective: Blood pressure (!) 158/86, pulse 94, temperature 99.1 F (37.3 C), temperature source Oral, resp. rate (!) 33, height _0  (1.651 m), weight (!) 150.7 kg (332 lb 3.7 oz), SpO2 95 %.  Intake/Output Summary (Last 24 hours) at 03/31/16 1558 Last data filed at 03/31/16 1500  Gross per 24 hour  Intake          4481.34 ml  Output                0 ml  Net          4481.34 ml   Filed Weights   03/27/16 1608 03/28/16 0626 03/31/16 0600  Weight: (!) 153.8 kg (339 lb) (!) 158.8 kg (350 lb 1.6 oz) (!) 150.7 kg (332 lb 3.7 oz)    Examination: General: No acute respiratory distress Lungs: Clear to auscultation bilaterally without wheezes or crackles - poor air movement B bases  Cardiovascular: Regular rate and rhythm without murmur gallop or rub  Abdomen: Nontender, obese, soft, bowel sounds positive, no rebound, no ascites, no appreciable mass Extremities: No significant cyanosis, clubbing, or edema  bilateral lower extremities  CBC:  Recent Labs Lab 03/27/16 1850 03/27/16 1933 03/28/16 0917 03/29/16 0218 03/31/16 0531  WBC 14.8*  --  10.2  10.3 10.9* 11.3*  NEUTROABS 12.2*  --  8.3*  --   --   HGB 7.8* 7.8* 7.6*  7.6* 8.5* 8.7*  HCT 24.8* 23.0* 24.0*  23.8* 27.0* 27.8*  MCV 82.7  --  82.8  82.6 83.1 83.5  PLT 252  --  201  206 224 396   Basic Metabolic Panel:  Recent Labs Lab 03/27/16 1850 03/27/16 1933 03/28/16 0917 03/29/16 1531 03/30/16 1113 03/31/16 0531  NA 131* 131* 137  136 143 145  --   K 4.7 4.8 3.7  3.6 5.0 4.0  --   CL 93* 94* 98*  98* 110 112*  --   CO2 26  --  _0 --   GLUCOSE 123* 115* 124*  124* 132* 205*  --   BUN 44* 55* 42*  43* 25* 23*  --   CREATININE 2.38* 2.40* 2.14*  2.08* 1.10* 1.12* 1.08*  CALCIUM 8.7*  --  8.9  8.9 9.5 9.2  --   MG  --   --  1.7 1.7 1.6*  --    GFR: Estimated Creatinine Clearance: 83.7 mL/min (by C-G formula based on SCr of 1.08 mg/dL (H)).  Liver Function Tests:  Recent Labs Lab 03/27/16 1850 03/28/16 0917 03/30/16 1113  AST 32 16 20  ALT _1 ALKPHOS 57 55 59  BILITOT 0.8 0.5 0.7  PROT 5.7* 5.8* 6.3*  ALBUMIN 3.1* 2.9* 3.2*   HbA1C: Hgb A1c MFr Bld  Date/Time Value Ref Range Status  02/16/2016 06:00 AM 6.1 (H) 4.8 - 5.6 % Final    Comment:    (NOTE)         Pre-diabetes: 5.7 - 6.4         Diabetes: >6.4         Glycemic control for adults with diabetes: <7.0   06/23/2015 01:36 PM 7.3 (H) 4.8 - 5.6 % Final    Comment:    (NOTE)         Pre-diabetes: 5.7 - 6.4         Diabetes: >6.4         Glycemic control for adults with diabetes: <7.0     CBG:  Recent Labs Lab 03/30/16 1254 03/30/16 1822 03/30/16 2149 03/31/16 0839 03/31/16 1117  GLUCAP 198* 170* 176* 227* 217*    Recent Results (from the past 240 hour(s))  Blood Culture (routine x 2)     Status: None (Preliminary result)   Collection Time: 03/27/16  6:12 PM  Result Value  Ref Range Status   Specimen  Description BLOOD RIGHT ANTECUBITAL  Final   Special Requests BOTTLES DRAWN AEROBIC ONLY 10CC  Final   Culture NO GROWTH 4 DAYS  Final   Report Status PENDING  Incomplete  Blood Culture (routine x 2)     Status: None (Preliminary result)   Collection Time: 03/27/16  6:50 PM  Result Value Ref Range Status   Specimen Description BLOOD RIGHT HAND  Final   Special Requests BOTTLES DRAWN AEROBIC AND ANAEROBIC 5CC  Final   Culture NO GROWTH 4 DAYS  Final   Report Status PENDING  Incomplete  Urine culture     Status: None   Collection Time: 03/27/16  8:20 PM  Result Value Ref Range Status   Specimen Description URINE, CATHETERIZED  Final   Special Requests NONE  Final   Culture NO GROWTH  Final   Report Status 03/29/2016 FINAL  Final  MRSA PCR Screening     Status: None   Collection Time: 03/28/16  6:30 AM  Result Value Ref Range Status   MRSA by PCR NEGATIVE NEGATIVE Final    Comment:        The GeneXpert MRSA Assay (FDA approved for NASAL specimens only), is one component of a comprehensive MRSA colonization surveillance program. It is not intended to diagnose MRSA infection nor to guide or monitor treatment for MRSA infections.      Scheduled Meds: . ceFEPime (MAXIPIME) IV  2 g Intravenous Q12H  . chlorhexidine  15 mL Mouth Rinse BID  . enoxaparin (LOVENOX) injection  1 mg/kg Subcutaneous Q12H  . haloperidol lactate  1 mg Intravenous Q12H  . insulin aspart  0-20 Units Subcutaneous TID WC  . LORazepam  1 mg Intravenous QHS  . mouth rinse  15 mL Mouth Rinse BID  . mouth rinse  15 mL Mouth Rinse q12n4p  . methylPREDNISolone (SOLU-MEDROL) injection  60 mg Intravenous Q24H  . sodium chloride flush  3 mL Intravenous Q12H  . vancomycin  1,750 mg Intravenous Q24H     LOS: 4 days   Cherene Altes, MD [facility] Office  [phone] Pager - Text Page per Shea Evans as per below:  On-Call/Text Page:      [email or web address]      password [email or web address]  If 7PM-7AM, please contact  night-coverage [email or web address] Password [facility] 03/31/2016, 3:58 PM

## 2016-03-31 NOTE — Care Management Important Message (Signed)
Important Message  Patient Details  Name: [name] MRN: [id number] Date of Birth: 21-Feb-1955   Medicare Important Message Given:  Yes    Orbie Pyo 03/31/2016, 4:25 PM

## 2016-03-31 NOTE — Procedures (Signed)
Objective Swallowing Evaluation: Type of Study: FEES-Fiberoptic Endoscopic Evaluation of Swallow  Patient Details  Name: [name] MRN: [id number] Date of Birth: [birth year]  Today's Date: 03/31/2016 Time: SLP Start Time (ACUTE ONLY): 1015-SLP Stop Time (ACUTE ONLY): 1057 SLP Time Calculation (min) (ACUTE ONLY): 42 min  Past Medical History:  Past Medical History:  Diagnosis Date  . Anemia   . Anxiety   . Arthritis    "pretty much all my joints"  . Asthma   . Benign paroxysmal positional vertigo 12/30/2012  . Breast cancer ([place]) 02/13/12   ruq  100'clock bx Ductal Carcinoma in Situ,(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  . Breast mass in female    L breast 2008, US showed likely fat necrosis  . Chronic lower back pain   . CKD (chronic kidney disease), stage III    "lower stage" (01/06/2014)  . Daily headache   . Depression   . GERD (gastroesophageal reflux disease)   . History of blood transfusion 2011   "after one of my OR's"  . History of hiatal hernia   . History of stomach ulcers   . HSV (herpes simplex virus) infection   . Hx of radiation therapy 05/05/12- 07/15/12   right breast, 45 gray x 25 fx, lumpectomy cavity boosted to 16.2 gray  . Hyperlipemia   . Hypertension    sees Dr. Criss Rosales , [facility] [place]  . Hypothyroidism   . Kidney stones   . Knee pain, bilateral   . Migraines    "~ 3 times/month" (01/06/2014)  . Obesity   . OSA on CPAP    pt does not know settings  . Pancreatitis    hx of  . Pneumonia 1950's  . Polyneuropathy in diabetes(357.2)   . Schizophrenia ([place])   . 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 Tachycardia    with sx in 2008  . Thyroid disease   . Type II diabetes mellitus ([place])    Past Surgical History:  Past Surgical History:  Procedure Laterality Date  . ABDOMINAL HYSTERECTOMY  1979?   partial  . BREAST LUMPECTOMY  03/03/2012   Procedure: LUMPECTOMY;  Surgeon: Haywood Lasso, MD;  Location: [facility];  Service: General;  Laterality: Right;  .  BREAST SURGERY Right 01/2012   "cancer"  . CHOLECYSTECTOMY  1980's?  . COLONOSCOPY WITH PROPOFOL N/A 09/28/2012   Procedure: COLONOSCOPY WITH PROPOFOL;  Surgeon: Lear Ng, MD;  Location: [facility];  Service: Endoscopy;  Laterality: N/A;  . FOOT FRACTURE SURGERY Right 1990's  . JOINT REPLACEMENT    . REVISION TOTAL KNEE ARTHROPLASTY  07/2009  . SHOULDER OPEN ROTATOR CUFF REPAIR Left 1990's  . THYROIDECTOMY  1970's  . TOTAL KNEE ARTHROPLASTY Bilateral 2009-06/2009   left; right   HPI: 61 year old female admitted 03/27/16 due to AMS, confusion. PMH significant for GERD, HLD, secondary Parksinsonism, scizophrenia, CKD3, HTN, OSA/obseity (339#), recurrent PNA, recurrent falls. CXR reveals subtle airspace opacities BLL L>R, PNA cannot be excluded.   Subjective: Pt sleeping in bed. No family present.    Assessment / Plan / Recommendation  CHL IP CLINICAL IMPRESSIONS 03/31/2016  Therapy Diagnosis Mild oral phase dysphagia;Mild pharyngeal phase dysphagia  Clinical Impression Pt demonstrates a primary respiratory based dysphagia, observable during this procedure as pt was very anxious with a RR in mid 30s during FEES due to fear with testing, almost hyperventilating. Pt also in a partially reclined position due to pain when upright (though given pain meds prior to testing), in order to  simulate realistic feeding scenarios in acute care. Oral and oropharyngeal function were WNL with good airway protection and strength. However, pt did intermittently hold portions of thin and solid boluses orally as she was unable to sustain a breath hold for consecutive swallows. Initially the swallow was timely, but as inability to tolerate apnea increased, liquids spilled to valleculae or pyriforms prior to the swallow. Still, even in distress no penetration or aspiration occurred, though risk will be present with PO intake. Late coughing was observed, as well as mild backflow from cervical esophagus. Suspect  coughing associated with esophageal stasis or reflux. After exam ended SLP offered more trials of thin liquids with verbal cues for very small sips to reduce need to piecemeal and hold bolus. Pt followed this strategy as son observed. Also discussed with RN. Pt to initiate a dys 2/thin liquid diet with small sips. Will follow for tolerance.   Impact on safety and function Moderate aspiration risk      CHL IP TREATMENT RECOMMENDATION 03/31/2016  Treatment Recommendations Therapy as outlined in treatment plan below     Prognosis 03/31/2016  Prognosis for Safe Diet Advancement Fair  Barriers to Reach Goals Behavior  Barriers/Prognosis Comment --    CHL IP DIET RECOMMENDATION 03/31/2016  SLP Diet Recommendations Dysphagia 2 (Fine chop) solids;Thin liquid  Liquid Administration via Straw  Medication Administration Whole meds with puree  Compensations Slow rate;Small sips/bites;Minimize environmental distractions  Postural Changes Remain semi-upright after after feeds/meals (Comment)      CHL IP OTHER RECOMMENDATIONS 03/31/2016  Recommended Consults --  Oral Care Recommendations Oral care BID  Other Recommendations --      CHL IP FOLLOW UP RECOMMENDATIONS 03/31/2016  Follow up Recommendations Skilled Nursing facility      [facility] IP FREQUENCY AND DURATION 03/31/2016  Speech Therapy Frequency (ACUTE ONLY) min 2x/week  Treatment Duration 2 weeks           CHL IP ORAL PHASE 03/31/2016  Oral Phase Impaired  Oral - Pudding Teaspoon --  Oral - Pudding Cup --  Oral - Honey Teaspoon --  Oral - Honey Cup --  Oral - Nectar Teaspoon --  Oral - Nectar Cup --  Oral - Nectar Straw --  Oral - Thin Teaspoon --  Oral - Thin Cup Holding of bolus;Piecemeal swallowing  Oral - Thin Straw --  Oral - Puree Holding of bolus;Piecemeal swallowing  Oral - Mech Soft Holding of bolus;Piecemeal swallowing  Oral - Regular --  Oral - Multi-Consistency --  Oral - Pill --  Oral Phase - Comment --    CHL IP  PHARYNGEAL PHASE 03/31/2016  Pharyngeal Phase Impaired  Pharyngeal- Pudding Teaspoon --  Pharyngeal --  Pharyngeal- Pudding Cup --  Pharyngeal --  Pharyngeal- Honey Teaspoon --  Pharyngeal --  Pharyngeal- Honey Cup --  Pharyngeal --  Pharyngeal- Nectar Teaspoon --  Pharyngeal --  Pharyngeal- Nectar Cup --  Pharyngeal --  Pharyngeal- Nectar Straw --  Pharyngeal --  Pharyngeal- Thin Teaspoon --  Pharyngeal --  Pharyngeal- Thin Cup --  Pharyngeal --  Pharyngeal- Thin Straw Delayed swallow initiation-vallecula;Delayed swallow initiation-pyriform sinuses;WFL  Pharyngeal --  Pharyngeal- Puree WFL  Pharyngeal --  Pharyngeal- Mechanical Soft WFL  Pharyngeal --  Pharyngeal- Regular --  Pharyngeal --  Pharyngeal- Multi-consistency --  Pharyngeal --  Pharyngeal- Pill --  Pharyngeal --  Pharyngeal Comment --     No flowsheet data found.  No flowsheet data found. Herbie [place], [place] CCC-SLP [phone]  Samarie Pinder, Katherene Ponto  03/31/2016, 11:30 AM

## 2016-03-31 NOTE — Progress Notes (Addendum)
Pharmacy Antibiotic Note  [name] is a 61 y.o. female admitted on 03/27/2016 with concern for pneumonia.  Pharmacy has been consulted for vancomycin dosing.    Day #4 of abx for possible PNA superimposed on CXR. MD states will treat for empiric course of 5 days. Afebrile, WBC wnl.  Plan: Continue vancomycin 1768m IV Q24h Increase cefepime to 2g IV Q12h Monitor clinical picture, renal function, VT prn F/U C&S, abx deescalation / LOT  Stop date entered in for 2/13  Height: 5' 5" (165.1 cm) Weight: (!) 332 lb 3.7 oz (150.7 kg) IBW/kg (Calculated) : 57  Temp (24hrs), Avg:98.8 F (37.1 C), Min:98.4 F (36.9 C), Max:99.8 F (37.7 C)   Recent Labs Lab 03/27/16 1850 03/27/16 1933 03/27/16 2248 03/28/16 0129 03/28/16 0430 03/28/16 0917 03/29/16 0218 03/29/16 1531 03/30/16 1113 03/31/16 0531  WBC 14.8*  --   --   --   --  10.2  10.3 10.9*  --   --  11.3*  CREATININE 2.38* 2.40*  --   --   --  2.14*  2.08*  --  1.10* 1.12* 1.08*  LATICACIDVEN  --  2.56* 1.16 1.1 0.9  --   --   --   --   --     Estimated Creatinine Clearance: 83.7 mL/min (by C-G formula based on SCr of 1.08 mg/dL (H)).    Allergies  Allergen Reactions  . Bee Venom Anaphylaxis  . Shellfish Allergy Anaphylaxis  . Buprenorphine Hcl Other (See Comments)    Overly sedated  . Morphine And Related Other (See Comments)    Overly sedated    Thank you for allowing pharmacy to be a part of this patient's care.  NElenor Quinones PharmD, BCPS Clinical Pharmacist Pager [phone]/01/2017 8:52 AM

## 2016-03-31 NOTE — Progress Notes (Signed)
ANTICOAGULATION CONSULT NOTE - Follow Up Consult  Pharmacy Consult for heparin Indication: r/o VTE  Labs:  Recent Labs  03/28/16 0917 03/28/16 1426 03/29/16 0218 03/29/16 1531 03/30/16 1113 03/31/16 0531  HGB 7.6*  7.6*  --  8.5*  --   --  8.7*  HCT 24.0*  23.8*  --  27.0*  --   --  27.8*  PLT 201  206  --  224  --   --  217  HEPARINUNFRC  --  0.30 0.29*  --   --   --   CREATININE 2.14*  2.08*  --   --  1.10* 1.12* 1.08*    Assessment: 61yo female on heparin to r/o VTE. AC rule out VTE. Dopplar negative. Hgb 8.7, plts wnl. 2/9: Heparin gtt changed to Lovenox for PE r/o until VQ scan obtained.   Goal of Therapy:  antiXa level = 0.6-1 units/ml (4hour after lovenox dose)   Plan:  Continue Lovenox 160 mg Stevinson Q12h Monitor renal function and CBC  F/u vascular studies  Elenor Quinones, PharmD, BCPS Clinical Pharmacist Pager [phone] 03/31/2016 9:00 AM

## 2016-03-31 NOTE — Progress Notes (Signed)
Text Paged MD,To inform that pts sn, [name], would like to speak with them. Also provided phone number

## 2016-03-31 NOTE — Progress Notes (Signed)
Inpatient Diabetes Program Recommendations  AACE/ADA: New Consensus Statement on Inpatient Glycemic Control (2015)  Target Ranges:  Prepandial:   less than 140 mg/dL      Peak postprandial:   less than 180 mg/dL (1-2 hours)      Critically ill patients:  140 - 180 mg/dL   Lab Results  Component Value Date   GLUCAP 217 (H) 03/31/2016   HGBA1C 6.1 (H) 02/16/2016   Results for YARI, SZELIGA (MRN [id number]) as of 03/31/2016 14:04  Ref. Range 03/30/2016 12:54 03/30/2016 18:22 03/30/2016 21:49 03/31/2016 08:39 03/31/2016 11:17  Glucose-Capillary Latest Ref Range: 65 - 99 mg/dL 198 (H) 170 (H) 176 (H) 227 (H) 217 (H)   Review of Glycemic Control  Diabetes history:     DM2, CKD, Obesity, steroids this adm. Outpatient Diabetes medications:     Levemir 10 units daily qhs    Humalog 10 units tidac    Janumet 50-500 mg bid Current orders for Inpatient glycemic control:     Resistant correction scale Novolog 0-20 units [facility]  Inpatient Diabetes Program Recommendations:    Noted that CBG's are starting to trend upward with no noted lows.    Please consider Levemir 8 units daily qhs.  Thank you,  Windy Carina, RN, MSN Diabetes Coordinator Inpatient Diabetes Program [phone] (Team Pager)

## 2016-03-31 NOTE — Progress Notes (Signed)
Son [name] called and would like to speak with a doctor. Please call. Can be reached at [phone].

## 2016-04-01 ENCOUNTER — Inpatient Hospital Stay (HOSPITAL_COMMUNITY): Payer: Medicare Other

## 2016-04-01 DIAGNOSIS — R1112 Projectile vomiting: Secondary | ICD-10-CM

## 2016-04-01 DIAGNOSIS — J690 Pneumonitis due to inhalation of food and vomit: Secondary | ICD-10-CM

## 2016-04-01 DIAGNOSIS — F200 Paranoid schizophrenia: Secondary | ICD-10-CM

## 2016-04-01 LAB — CBC
HCT: 33.1 % — ABNORMAL LOW (ref 36.0–46.0)
HEMOGLOBIN: 10.4 g/dL — AB (ref 12.0–15.0)
MCH: 26 pg (ref 26.0–34.0)
MCHC: 31.4 g/dL (ref 30.0–36.0)
MCV: 82.8 fL (ref 78.0–100.0)
Platelets: 229 10*3/uL (ref 150–400)
RBC: 4 MIL/uL (ref 3.87–5.11)
RDW: 17.3 % — ABNORMAL HIGH (ref 11.5–15.5)
WBC: 15.3 10*3/uL — ABNORMAL HIGH (ref 4.0–10.5)

## 2016-04-01 LAB — VITAMIN B12: Vitamin B-12: 929 pg/mL — ABNORMAL HIGH (ref 180–914)

## 2016-04-01 LAB — COMPREHENSIVE METABOLIC PANEL
ALBUMIN: 2.9 g/dL — AB (ref 3.5–5.0)
ALT: 26 U/L (ref 14–54)
ANION GAP: 9 (ref 5–15)
AST: 23 U/L (ref 15–41)
Alkaline Phosphatase: 59 U/L (ref 38–126)
BUN: 32 mg/dL — ABNORMAL HIGH (ref 6–20)
CO2: 23 mmol/L (ref 22–32)
Calcium: 8.5 mg/dL — ABNORMAL LOW (ref 8.9–10.3)
Chloride: 113 mmol/L — ABNORMAL HIGH (ref 101–111)
Creatinine, Ser: 1.09 mg/dL — ABNORMAL HIGH (ref 0.44–1.00)
GFR calc Af Amer: >60 mL/min (ref >60)
GFR calc non Af Amer: 54 mL/min — ABNORMAL LOW (ref >60)
GLUCOSE: 196 mg/dL — AB (ref 65–99)
Potassium: 3.6 mmol/L (ref 3.5–5.1)
SODIUM: 145 mmol/L (ref 135–145)
Total Bilirubin: 0.7 mg/dL (ref 0.3–1.2)
Total Protein: 6.1 g/dL — ABNORMAL LOW (ref 6.5–8.1)

## 2016-04-01 LAB — CULTURE, BLOOD (ROUTINE X 2)
CULTURE: NO GROWTH
Culture: NO GROWTH

## 2016-04-01 LAB — FOLATE: Folate: 33 ng/mL (ref >5.9)

## 2016-04-01 LAB — GLUCOSE, CAPILLARY
Glucose-Capillary: 188 mg/dL — ABNORMAL HIGH (ref 65–99)
Glucose-Capillary: 192 mg/dL — ABNORMAL HIGH (ref 65–99)
Glucose-Capillary: 136 mg/dL — ABNORMAL HIGH (ref 65–99)
Glucose-Capillary: 141 mg/dL — ABNORMAL HIGH (ref 65–99)

## 2016-04-01 LAB — C DIFFICILE QUICK SCREEN W PCR REFLEX
C DIFFICILE (CDIFF) INTERP: NOT DETECTED
C DIFFICILE (CDIFF) TOXIN: NEGATIVE
C Diff antigen: NEGATIVE

## 2016-04-01 LAB — RETICULOCYTES
RBC.: 4 MIL/uL (ref 3.87–5.11)
RETIC COUNT ABSOLUTE: 100 10*3/uL (ref 19.0–186.0)
RETIC CT PCT: 2.5 % (ref 0.4–3.1)

## 2016-04-01 LAB — IRON AND TIBC
Iron: 63 ug/dL (ref 28–170)
Saturation Ratios: 19 % (ref 10.4–31.8)
TIBC: 330 ug/dL (ref 250–450)
UIBC: 267 ug/dL

## 2016-04-01 LAB — MAGNESIUM: Magnesium: 2 mg/dL (ref 1.7–2.4)

## 2016-04-01 LAB — FERRITIN: FERRITIN: 31 ng/mL (ref 11–307)

## 2016-04-01 NOTE — Progress Notes (Signed)
Inpatient Diabetes Program Recommendations  AACE/ADA: New Consensus Statement on Inpatient Glycemic Control (2015)  Target Ranges:  Prepandial:   less than 140 mg/dL      Peak postprandial:   less than 180 mg/dL (1-2 hours)      Critically ill patients:  140 - 180 mg/dL   Lab Results  Component Value Date   GLUCAP 192 (H) 04/01/2016   HGBA1C 6.1 (H) 02/16/2016    Review of Glycemic Control:  Results for MEGHAN, WARSHAWSKY (MRN [id number]) as of 04/01/2016 13:58  Ref. Range 03/31/2016 08:39 03/31/2016 11:17 03/31/2016 17:05 03/31/2016 21:57 04/01/2016 07:24 04/01/2016 12:19  Glucose-Capillary Latest Ref Range: 65 - 99 mg/dL 227 (H) 217 (H) 222 (H) 170 (H) 188 (H) 192 (H)   Diabetes history: Type 2 diabetes Outpatient Diabetes medications: Levemir 10 units q HS, Humalog 10 units tid with meals Current orders for Inpatient glycemic control:  Novolog resistant tid with meals  Inpatient Diabetes Program Recommendations:    Please consider restarting Levemir 10 units q HS.    Thanks, Adah Perl, RN, BC-ADM Inpatient Diabetes Coordinator Pager [phone] (8a-5p)

## 2016-04-01 NOTE — Progress Notes (Signed)
Speech Language Pathology Treatment: Dysphagia  Patient Details Name: [name] MRN: [id number] DOB: 1956/02/08 Today's Date: 04/01/2016 Time: 1000-1008 SLP Time Calculation (min) (ACUTE ONLY): 8 min  Assessment / Plan / Recommendation Clinical Impression  Pt seen after completing a small portion of am meal. Pt reported poor appetite, only tried a few bites of oatmeal. SLP was able to encourage pt to take further sips of thin liquids under observation. Pt initially attempting rapidly consume single sips, sometimes with oral holding for respiration. Provided verbal cues to slow down rate and bolus size with improvement. No signs of aspiration observed. Currently appears to tolerated diet, though precautions are needed. Will continue to follow.    HPI HPI: 61 year old female admitted 03/27/16 due to AMS, confusion. PMH significant for GERD, HLD, secondary Parksinsonism, scizophrenia, CKD3, HTN, OSA/obseity (339#), recurrent PNA, recurrent falls. CXR reveals subtle airspace opacities BLL L>R, PNA cannot be excluded.       SLP Plan  Continue with current plan of care     Recommendations  Diet recommendations: Dysphagia 2 (fine chop);Thin liquid Liquids provided via: Cup;Straw Medication Administration: Whole meds with puree Supervision: Patient able to self feed Compensations: Slow rate;Small sips/bites;Minimize environmental distractions                Follow up Recommendations: Skilled Nursing facility Plan: Continue with current plan of care       GO               Tammie [place], MA CCC-SLP [phone]  [name] 04/01/2016, 11:11 AM

## 2016-04-01 NOTE — Progress Notes (Signed)
PROGRESS NOTE    [name]  XKG:[id number] DOB: [birth year] DOA: 03/27/2016 PCP: [name], MD   Brief Narrative:  61 y.o. BF PMHx Schizophrenia, Anxiety,Depression, CKD stage III, HTN, HLD, Obesity, DM type II uncontrolled with complication, OSA/OHS, morbid obesity, obstructive sleep apnea, diagnosed 2008 Lt Breast cancer Ductal Carcinoma in Situ, S/P lumpectomy + XRT, HSV (herpes simplex virus) infection,Chronic lower back pain   she was recently hospitalized here for community-acquired pneumonia presenting with AMS.  She is unable to answer any questions and is unaccompanied.    HPI for Dr. Tyrone Nine:    [name] with a chief complaint of altered mental status. Family said that they've been acting more confused today. Having some mild cough and side pain. Patient is currently more alert than she had been earlier today. States that she has no issues with her side currently but has been coughing. Having some diffuse myalgias worse to the legs. Family denies any recent medication changes. Has been having multiple falls over the past couple days.  She was previously hospitalized: -1/12-15/18 for sepsis due to HCAP, acute hypoxic respiratory failure, slurred speech, and  AKI on CKD. -12/27-31/17 for sepsis due to multifocal PNA with mild AKI, metabolic encephalopathy; acute on chronic diastolic CHF exacerbation  ED Course: Per Dr. Tyrone Nine: [name] with multiple falls over past couple of days. Patient also has had some transient confusion. Was just in the hospital with hospital acquired pneumonia. Will repeat a chest x-ray obtain a UA. Check basic labs. CT the head with multiple falls recently as well. With generalized weakness will check a troponin. The patient had a reported blood pressure in the field and in the 70s. Here her blood pressure has been in the 702O systolic. Will give a liter of fluids. Send off for cultures.  Patient continues to have a soft blood pressure. Lactate very  minimally elevated. Chest x-ray concerning for possible pneumonia. Patient also has an acute kidney injury. Will give more fluids. Broadly cover with antibiotics as patient was just in the hospital. Discussed with hospitalist   Subjective: 2/13   A/O 4. Acute onset of abdominal pain starting after she began to have a diet. MAXIMUM TEMPERATURE overnight 38.1 C. Patient states severe abdominal pain worse RLQ. Positive projectile vomiting this A.m. positive diarrhea watery, greenish, foul-smelling (C. difficile?)     Assessment & Plan:   Principal Problem:   Encephalopathy Active Problems:   Hypothyroidism   Anemia   Diabetes mellitus, type II, insulin dependent ([place])   HCAP (healthcare-associated pneumonia)   Acute renal failure (ARF) ([place])   Disorganized schizophrenia ([place])   Chronic pain syndrome   Acute renal failure ([place])   Controlled diabetes mellitus type 2 with complications ([facility])   Acute encephalopathy   Oropharyngeal dysphagia   Aspiration pneumonia of right lower lobe due to vomit ([place])   Altered mental status   Schizophrenia ([place])   Acute Encephalopathy -Multifactorial (has HCAP, see below); PE; CVA; overdose; renal failure; metabolic encephalopathy. -CT head negative -Resolved -Patient taking large amounts of narcotics, benzodiazepines, muscle relaxant, antipsychotics which most likely is contributing to her encephalopathy. 2/13 patient's son concerned that on 2/12 patient's mental status and abrupt change: CVA? Obtain head CT  Schizophrenia -See chronic pain and dysphagia   Chronic pain syndrome  -in order to avoid withdrawal will need to continue some patient's home medications at a lower dose  A. Ativan 1 mg QHS  B. Morphine 1 mg  q 6  hr PRN  C. Haldol IV 2 mg BID  D. hold Lyrica, Zanaflex, Geodon until patent passes swallow study.  Dysphagia -2/9 patient miserably failed bedside swallow. On 2/12 FEES passed. But secondary to projectile vomiting patient  made nothing by mouth again .   HCAP/Aspiration Pneumonia/PE? -Given recent hospitalizations for CAP treat for HCAP. -Most likely secondary to aspiration, secondary to medication overdose, and dysphagia -Cefepime and Vancomycin. Complete 5 day course antibiotics -D5-0.9% saline 152m/hr -DuoNeb QID -Flonase  -Flutter Valve -Sputum cultures pending -Blood cultures NGTD -Strep pneumo/influenza negative -With multiple episodes of PNA in the last 6 weeks. VQ scan pending,  -Lower extremity Doppler negative -VQ scan negative   ARF(Baseline1.07 on 1/14)  -Creatinine 2.40,  -Rehydrate and follow Lab Results  Component Value Date   CREATININE 1.09 (H) 04/01/2016   CREATININE 1.08 (H) 03/31/2016   CREATININE 1.12 (H) 03/30/2016  -within normal limits with hydration  Anemia -Hgb 7.8, 8.7 on 1/14 -Will follow  Recent Labs Lab 03/27/16 1850 03/27/16 1933 03/28/16 0917 03/29/16 0218 03/31/16 0531 04/01/16 0432  HGB 7.8* 7.8* 7.6*  7.6* 8.5* 8.7* 10.4*   Hypothyroidism -Low TSH in December -Will recheck TSH and free T4 at this time  DM type 2 controlled with complication -AD3U7.3 in May 2017, 6.1 in 12/17 -Will not recheck -Hold long-acting insulin for now, cover with SSI  Hypomagnesemia -Magnesium IV 2 g  Projectile Vomiting/Acute Abdominal pain/Diarrhea. -NPO -KUB -Stool culture, C. difficile by PCR      DVT prophylaxis: Lovenox Code Status: Full Family Communication:  Disposition Plan: Resolution encephalopathy/pneumonia   Consultants:  [facility]not [facility]    Procedures/Significant Events:  2/8 DG chest:Low lung volumes with borderline cardiomegaly and interstitial edema. Subtle airspace opacities at the lung bases left greater than right cannot exclude superimposed pneumonia 2/9 CT abdomen pelvis WO contrast:-Negative for nephrolithiasis hydronephrosis or ureteral stone. - Mild density in the left > right  posterior lower lobes could reflect pneumonia or possible aspiration. 2/10 VQ scan: Negative for PE   VENTILATOR SETTINGS: None   Cultures 2/8 blood NGTD 2/8 urine Negative 2/9 sputum pending  Antimicrobials: Anti-infectives    Start     Stop   03/29/16 0000  ceFEPIme (MAXIPIME) 2 g in dextrose 5 % 50 mL IVPB     04/04/16 2359   03/28/16 2200  vancomycin (VANCOCIN) 1,750 mg in sodium chloride 0.9 % 500 mL IVPB         03/28/16 2000  ceFEPIme (MAXIPIME) 2 g in dextrose 5 % 50 mL IVPB  Status:  Discontinued     03/28/16 1945   03/28/16 0100  ceFEPIme (MAXIPIME) 2 g in dextrose 5 % 50 mL IVPB     03/28/16 0211   03/27/16 2130  vancomycin (VANCOCIN) 2,500 mg in sodium chloride 0.9 % 500 mL IVPB     03/28/16 0125   03/27/16 2115  vancomycin (VANCOCIN) 3,076 mg in sodium chloride 0.9 % 500 mL IVPB  Status:  Discontinued     03/27/16 2109   03/27/16 2115  piperacillin-tazobactam (ZOSYN) IVPB 3.375 g     03/28/16 0013       Devices    LINES / TUBES:      Continuous Infusions:    Objective: Vitals:   04/01/16 0000 04/01/16 0355 04/01/16 0726 04/01/16 0852  BP: (!) 150/77 (!) 151/72 (!) 169/93 (!) 155/93  Pulse: 86 86 100 (!) 101  Resp: (!) 29 (!) 25 (!) 28  Temp: (!) 100.5 F (38.1 C) 99.4 F (37.4 C) 98.4 F (36.9 C)   TempSrc: Axillary Axillary Oral   SpO2: 97% 97% 95%   Weight:      Height:        Intake/Output Summary (Last 24 hours) at 04/01/16 5361 Last data filed at 03/31/16 2217  Gross per 24 hour  Intake          1129.67 ml  Output                0 ml  Net          1129.67 ml   Filed Weights   03/28/16 0626 03/31/16 0600 03/31/16 1629  Weight: (!) 158.8 kg (350 lb 1.6 oz) (!) 150.7 kg (332 lb 3.7 oz) (!) 151.8 kg (334 lb 10.5 oz)    Examination:  General: A/O 4 positive acute respiratory distress, Acute abdominal pain Eyes: negative scleral hemorrhage, negative anisocoria, negative icterus ENT: Negative Runny nose, negative gingival  bleeding, Neck:  Negative scars, masses, torticollis, lymphadenopathy, JVD Lungs: difficult to appreciate breath sounds secondary to body habitus. Mild expiratory wheezing.  Cardiovascular: Regular rate and rhythm without murmur gallop or rub normal S1 and S2 Abdomen: MORBIDLY OBESE, positive abdominal pain RUQ,LUQ, worse in RLQ, distended, positive soft, bowel sounds, no rebound, no ascites, no appreciable mass Extremities: No significant cyanosis, clubbing, or edema bilateral lower extremities Skin: Negative rashes, lesions, ulcers Psychiatric:  Negative depression, negative anxiety, negative fatigue, negative mania  Central nervous system:  Cranial nerves II through XII intact, tongue/uvula midline, all extremities muscle strength 5/5, sensation intact throughout,  negative dysarthria, negative expressive aphasia, negative receptive aphasia.  .     Data Reviewed: Care during the described time interval was provided by me .  I have reviewed this patient's available data, including medical history, events of note, physical examination, and all test results as part of my evaluation. I have personally reviewed and interpreted all radiology studies.  CBC:  Recent Labs Lab 03/27/16 1850 03/27/16 1933 03/28/16 0917 03/29/16 0218 03/31/16 0531 04/01/16 0432  WBC 14.8*  --  10.2  10.3 10.9* 11.3* 15.3*  NEUTROABS 12.2*  --  8.3*  --   --   --   HGB 7.8* 7.8* 7.6*  7.6* 8.5* 8.7* 10.4*  HCT 24.8* 23.0* 24.0*  23.8* 27.0* 27.8* 33.1*  MCV 82.7  --  82.8  82.6 83.1 83.5 82.8  PLT 252  --  201  206 224 217 443   Basic Metabolic Panel:  Recent Labs Lab 03/27/16 1850 03/27/16 1933 03/28/16 0917 03/29/16 1531 03/30/16 1113 03/31/16 0531 04/01/16 0432  NA 131* 131* 137  136 143 145  --  145  K 4.7 4.8 3.7  3.6 5.0 4.0  --  3.6  CL 93* 94* 98*  98* 110 112*  --  113*  CO2 26  --  _0 --  23  GLUCOSE 123* 115* 124*  124* 132* 205*  --  196*  BUN 44* 55* 42*  43*  25* 23*  --  32*  CREATININE 2.38* 2.40* 2.14*  2.08* 1.10* 1.12* 1.08* 1.09*  CALCIUM 8.7*  --  8.9  8.9 9.5 9.2  --  8.5*  MG  --   --  1.7 1.7 1.6*  --  2.0   GFR: Estimated Creatinine Clearance: 83.3 mL/min (by C-G formula based on SCr of 1.09 mg/dL (H)). Liver Function Tests:  Recent Labs Lab 03/27/16 1850  03/28/16 0917 03/30/16 1113 04/01/16 0432  AST 32 _0 ALT _1 ALKPHOS 57 55 59 59  BILITOT 0.8 0.5 0.7 0.7  PROT 5.7* 5.8* 6.3* 6.1*  ALBUMIN 3.1* 2.9* 3.2* 2.9*   No results for input(s): LIPASE, AMYLASE in the last 168 hours. No results for input(s): AMMONIA in the last 168 hours. Coagulation Profile: No results for input(s): INR, PROTIME in the last 168 hours. Cardiac Enzymes: No results for input(s): CKTOTAL, CKMB, CKMBINDEX, TROPONINI in the last 168 hours. BNP (last 3 results) No results for input(s): PROBNP in the last 8760 hours. HbA1C: No results for input(s): HGBA1C in the last 72 hours. CBG:  Recent Labs Lab 03/31/16 0839 03/31/16 1117 03/31/16 1705 03/31/16 2157 04/01/16 0724  GLUCAP 227* 217* 222* 170* 188*   Lipid Profile: No results for input(s): CHOL, HDL, LDLCALC, TRIG, CHOLHDL, LDLDIRECT in the last 72 hours. Thyroid Function Tests: No results for input(s): TSH, T4TOTAL, FREET4, T3FREE, THYROIDAB in the last 72 hours. Anemia Panel:  Recent Labs  04/01/16 0432  VITAMINB12 929*  FOLATE 33.0  FERRITIN 31  TIBC 330  IRON 63  RETICCTPCT 2.5   Urine analysis:    Component Value Date/Time   COLORURINE YELLOW 03/27/2016 2020   APPEARANCEUR CLEAR 03/27/2016 2020   APPEARANCEUR Clear 11/13/2013 1310   LABSPEC 1.012 03/27/2016 2020   LABSPEC 1.017 11/13/2013 1310   PHURINE 5.0 03/27/2016 2020   GLUCOSEU NEGATIVE 03/27/2016 2020   GLUCOSEU Negative 11/13/2013 1310   GLUCOSEU NEG mg/dL 03/02/2006 2050   HGBUR NEGATIVE 03/27/2016 2020   BILIRUBINUR NEGATIVE 03/27/2016 2020   BILIRUBINUR Negative 11/13/2013 1310    KETONESUR NEGATIVE 03/27/2016 2020   PROTEINUR NEGATIVE 03/27/2016 2020   UROBILINOGEN 0.2 11/12/2014 2057   NITRITE NEGATIVE 03/27/2016 2020   LEUKOCYTESUR NEGATIVE 03/27/2016 2020   LEUKOCYTESUR Trace 11/13/2013 1310   Sepsis Labs: _2 (procalcitonin:4,lacticidven:4)  ) Recent Results (from the past 240 hour(s))  Blood Culture (routine x 2)     Status: None (Preliminary result)   Collection Time: 03/27/16  6:12 PM  Result Value Ref Range Status   Specimen Description BLOOD RIGHT ANTECUBITAL  Final   Special Requests BOTTLES DRAWN AEROBIC ONLY 10CC  Final   Culture NO GROWTH 4 DAYS  Final   Report Status PENDING  Incomplete  Blood Culture (routine x 2)     Status: None (Preliminary result)   Collection Time: 03/27/16  6:50 PM  Result Value Ref Range Status   Specimen Description BLOOD RIGHT HAND  Final   Special Requests BOTTLES DRAWN AEROBIC AND ANAEROBIC 5CC  Final   Culture NO GROWTH 4 DAYS  Final   Report Status PENDING  Incomplete  Urine culture     Status: None   Collection Time: 03/27/16  8:20 PM  Result Value Ref Range Status   Specimen Description URINE, CATHETERIZED  Final   Special Requests NONE  Final   Culture NO GROWTH  Final   Report Status 03/29/2016 FINAL  Final  MRSA PCR Screening     Status: None   Collection Time: 03/28/16  6:30 AM  Result Value Ref Range Status   MRSA by PCR NEGATIVE NEGATIVE Final    Comment:        The GeneXpert MRSA Assay (FDA approved for NASAL specimens only), is one component of a comprehensive MRSA colonization surveillance program. It is not intended to diagnose MRSA infection nor to guide or monitor treatment for MRSA infections.  Radiology Studies: No results found.      Scheduled Meds: . carvedilol  3.125 mg Oral BID WC  . chlorhexidine  15 mL Mouth Rinse BID  . docusate sodium  200 mg Oral QHS  . enoxaparin (LOVENOX) injection  80 mg Subcutaneous Q24H  . furosemide  40 mg Oral Daily  .  haloperidol lactate  1 mg Intravenous Q12H  . insulin aspart  0-20 Units Subcutaneous TID WC  . levothyroxine  150 mcg Oral QAC breakfast  . LORazepam  1 mg Oral QHS  . mouth rinse  15 mL Mouth Rinse BID  . polyethylene glycol  17 g Oral Daily  . sodium chloride flush  3 mL Intravenous Q12H   Continuous Infusions:    LOS: 5 days    Time spent: 40 minutes    WOODS, Geraldo Docker, MD [facility] Pager [phone]   If 7PM-7AM, please contact night-coverage [email or web address] Password [email or web address] 04/01/2016, 9:06 AM

## 2016-04-01 NOTE — Progress Notes (Signed)
2nd attempt to place pt on CPAP for the night. Pt states she not ready yet. RN to call when she ready.

## 2016-04-01 NOTE — Evaluation (Signed)
Occupational Therapy Evaluation Patient Details Name: [name] MRN: [id number] DOB: 02/13/1956 Today's Date: 04/01/2016    History of Present Illness Pt admitted with AMS thought to be due to polypharmacy. PMH: recurrent PNA, chronic back pain, BPPV, morbid obesity,schizophrenia, secondary Parkinsonism.   Clinical Impression   Pt ambulated short distances with a rollator and was dependent in showering, dressing, meal prep and housekeeping. She was able to toilet and went to the store with her daughter and used electric w/c. Pt presents with increased back pain limiting tolerance of activity to EOB for only a few minutes. She required 2 person total assist for bed level mobility. Recommending SNF for continued rehab prior to return home as she lives alone. Will follow.    Follow Up Recommendations  SNF;Supervision/Assistance - 24 hour    Equipment Recommendations  None recommended by OT    Recommendations for Other Services       Precautions / Restrictions Precautions Precautions: Fall Restrictions Weight Bearing Restrictions: No      Mobility Bed Mobility Overal bed mobility: Needs Assistance Bed Mobility: Rolling;Sidelying to Sit;Sit to Sidelying Rolling: +2 for physical assistance;Total assist Sidelying to sit: +2 for physical assistance;Total assist;HOB elevated     Sit to sidelying: +2 for physical assistance;Total assist General bed mobility comments: Assist for all aspects and pt crying out with pain with transitional movements  Transfers                 General transfer comment: unable to tolerate    Balance Overall balance assessment: Needs assistance Sitting-balance support: Bilateral upper extremity supported;Feet supported Sitting balance-Leahy Scale: Poor Sitting balance - Comments: Sat EOB only 2 minutes with min A. Limited by pain and nausea                                    ADL Overall ADL's : Needs  assistance/impaired Eating/Feeding: Minimal assistance;Bed level   Grooming: Wash/dry hands;Wash/dry face;Bed level;Minimal assistance                                 General ADL Comments: Pt is dependent in bathing, dressing and toileting. Unable to tolerate more than a few minutes at EOB.     Vision     Perception     Praxis      Pertinent Vitals/Pain Pain Assessment: Faces Faces Pain Scale: Hurts whole lot Pain Location: back Pain Descriptors / Indicators: Grimacing Pain Intervention(s): Repositioned;Limited activity within patient's tolerance;Monitored during session     Hand Dominance Right   Extremity/Trunk Assessment Upper Extremity Assessment Upper Extremity Assessment: Generalized weakness   Lower Extremity Assessment Lower Extremity Assessment: Defer to PT evaluation RLE Deficits / Details: strength <3/5 LLE Deficits / Details: strength <3/5   Cervical / Trunk Assessment Cervical / Trunk Assessment: Other exceptions (chronic back pain, pt reports 3 herniated discs)   Communication Communication Communication: Expressive difficulties (slow)   Cognition Arousal/Alertness: Awake/alert Behavior During Therapy: Flat affect Overall Cognitive Status: Impaired/Different from baseline Area of Impairment: Problem solving;Following commands       Following Commands: Follows one step commands with increased time     Problem Solving: Slow processing;Decreased initiation;Requires verbal cues;Requires tactile cues     General Comments       Exercises       Shoulder Instructions      Home  Living Family/patient expects to be discharged to:: Private residence Living Arrangements: Alone Available Help at Discharge: Family;Available PRN/intermittently;Personal care attendant Type of Home: House Home Access: Stairs to enter [employer] of Steps: 1 Entrance Stairs-Rails: None Home Layout: One level     Bathroom Shower/Tub: Walk-in  Hydrologist: Handicapped height     Home Equipment: Cane - single point;Shower seat;Walker - 4 wheels   Additional Comments: States her children assist her with groceries, shopping      Prior Functioning/Environment Level of Independence: Needs assistance  Gait / Transfers Assistance Needed: pt reported that she ambulates with use of rollator ADL's / Homemaking Assistance Needed: pt reported that caregiver assists with bathing and dressing, meal prep and houskeeping   Comments: walks to bathroom and primarily stays in bed due to back pain        OT Problem List: Decreased strength;Decreased activity tolerance;Impaired balance (sitting and/or standing);Decreased cognition;Cardiopulmonary status limiting activity;Pain;Obesity   OT Treatment/Interventions: Self-care/ADL training;Neuromuscular education;DME and/or AE instruction;Therapeutic activities;Patient/family education;Balance training;Therapeutic exercise    OT Goals(Current goals can be found in the care plan section) Acute Rehab OT Goals Patient Stated Goal: not stated OT Goal Formulation: With patient Time For Goal Achievement: 04/15/16 Potential to Achieve Goals: Fair ADL Goals Pt Will Perform Grooming: with min assist;sitting Pt Will Transfer to Toilet: with +2 assist;with min assist;bedside commode;stand pivot transfer Pt Will Perform Toileting - Clothing Manipulation and hygiene: with mod assist;sit to/from stand Additional ADL Goal #1: Pt will perform bed mobility using log roll technique with +2 min assist in preparation for ADL at EOB.  OT Frequency: Min 2X/week   Barriers to D/C: Decreased caregiver support          Co-evaluation PT/OT/SLP Co-Evaluation/Treatment: Yes Reason for Co-Treatment: For patient/therapist safety PT goals addressed during session: Mobility/safety with mobility OT goals addressed during session: Strengthening/ROM      End of Session Equipment Utilized During  Treatment: Oxygen Nurse Communication: Mobility status  Activity Tolerance: Patient limited by pain Patient left: in bed;with call bell/phone within reach   Time: 3838-1840 OT Time Calculation (min): 18 min Charges:  OT General Charges $OT Visit: 1 Procedure OT Evaluation $OT Eval Moderate Complexity: 1 Procedure G-Codes:    Malka So 04/01/2016, 2:52 PM  [phone]

## 2016-04-01 NOTE — Evaluation (Signed)
Physical Therapy Evaluation Patient Details Name: [name] MRN: [id number] DOB: 16-Jun-1955 Today's Date: 04/01/2016   History of Present Illness  Pt admitted with AMS thought to be due to polypharmacy. PMH: recurrent PNA, chronic back pain, BPPV, morbid obesity,schizophrenia, secondary Parkinsonism.  Clinical Impression  Pt admitted with above diagnosis and presents to PT with functional limitations due to deficits listed below (See PT problem list). Pt needs skilled PT to maximize independence and safety to allow discharge to SNF. Pt currently extremely limited with mobility and lives alone. Feel she needs SNF before return home.     Follow Up Recommendations SNF    Equipment Recommendations  None recommended by PT    Recommendations for Other Services       Precautions / Restrictions Precautions Precautions: Fall Restrictions Weight Bearing Restrictions: No      Mobility  Bed Mobility Overal bed mobility: Needs Assistance Bed Mobility: Rolling;Sidelying to Sit;Sit to Sidelying Rolling: +2 for physical assistance;Total assist Sidelying to sit: +2 for physical assistance;Total assist;HOB elevated     Sit to sidelying: +2 for physical assistance;Total assist General bed mobility comments: Assist for all aspects and pt crying out with pain with transitional movements  Transfers                    Ambulation/Gait                Stairs            Wheelchair Mobility    Modified Rankin (Stroke Patients Only)       Balance Overall balance assessment: Needs assistance Sitting-balance support: Bilateral upper extremity supported;Feet supported Sitting balance-Leahy Scale: Poor Sitting balance - Comments: Sat EOB only 2 minutes with min A. Limited by pain and nausea                                     Pertinent Vitals/Pain Pain Assessment: Faces Faces Pain Scale: Hurts whole lot Pain Location: back Pain Descriptors /  Indicators: Grimacing Pain Intervention(s): Limited activity within patient's tolerance;Monitored during session;Repositioned    Home Living Family/patient expects to be discharged to:: Private residence Living Arrangements: Alone Available Help at Discharge: Family;Available PRN/intermittently;Personal care attendant Type of Home: House Home Access: Stairs to enter Entrance Stairs-Rails: None Entrance Stairs-Number of Steps: 1 Home Layout: One level Home Equipment: Cane - single point;Shower seat;Walker - 4 wheels Additional Comments: States her children assist her with groceries, shopping    Prior Function Level of Independence: Needs assistance   Gait / Transfers Assistance Needed: pt reported that she ambulates with use of rollator  ADL's / Homemaking Assistance Needed: pt reported that caregiver assists with bathing and dressing, meal prep and houskeeping  Comments: walks to bathroom and primarily stays in bed due to back pain     Hand Dominance   Dominant Hand: Right    Extremity/Trunk Assessment   Upper Extremity Assessment Upper Extremity Assessment: Defer to OT evaluation    Lower Extremity Assessment Lower Extremity Assessment: RLE deficits/detail;LLE deficits/detail RLE Deficits / Details: strength <3/5 LLE Deficits / Details: strength <3/5       Communication   Communication: Expressive difficulties (slow )  Cognition Arousal/Alertness: Awake/alert Behavior During Therapy: Flat affect Overall Cognitive Status: Impaired/Different from baseline Area of Impairment: Problem solving;Following commands       Following Commands: Follows one step commands with increased time  Problem Solving: Slow processing;Decreased initiation;Requires verbal cues;Requires tactile cues      General Comments      Exercises     Assessment/Plan    PT Assessment Patient needs continued PT services  PT Problem List Decreased strength;Decreased activity  tolerance;Decreased balance;Decreased mobility;Obesity;Pain          PT Treatment Interventions DME instruction;Gait training;Functional mobility training;Therapeutic activities;Therapeutic exercise;Balance training;Patient/family education    PT Goals (Current goals can be found in the Care Plan section)  Acute Rehab PT Goals Patient Stated Goal: not stated PT Goal Formulation: With patient Time For Goal Achievement: 04/15/16 Potential to Achieve Goals: Fair    Frequency Min 2X/week   Barriers to discharge Inaccessible home environment;Decreased caregiver support Lives alone, stairs to enter    Co-evaluation PT/OT/SLP Co-Evaluation/Treatment: Yes Reason for Co-Treatment: For patient/therapist safety PT goals addressed during session: Mobility/safety with mobility OT goals addressed during session: Strengthening/ROM       End of Session Equipment Utilized During Treatment: Oxygen Activity Tolerance: Patient limited by pain;Patient limited by fatigue Patient left: in bed;with call bell/phone within reach Nurse Communication: Mobility status;Need for lift equipment         Time: 9233-0076 PT Time Calculation (min) (ACUTE ONLY): 18 min   Charges:   PT Evaluation $PT Eval Moderate Complexity: 1 Procedure     PT G CodesShary Decamp [facility] 2016-04-04, 2:37 PM [facility] PT [phone]

## 2016-04-02 ENCOUNTER — Inpatient Hospital Stay (HOSPITAL_COMMUNITY): Payer: Medicare Other

## 2016-04-02 DIAGNOSIS — G9340 Encephalopathy, unspecified: Secondary | ICD-10-CM

## 2016-04-02 LAB — GLUCOSE, CAPILLARY
GLUCOSE-CAPILLARY: 160 mg/dL — AB (ref 65–99)
GLUCOSE-CAPILLARY: 139 mg/dL — AB (ref 65–99)
GLUCOSE-CAPILLARY: 123 mg/dL — AB (ref 65–99)
Glucose-Capillary: 115 mg/dL — ABNORMAL HIGH (ref 65–99)

## 2016-04-02 LAB — MAGNESIUM: MAGNESIUM: 2 mg/dL (ref 1.7–2.4)

## 2016-04-02 MED ORDER — LEVOFLOXACIN 750 MG PO TABS
750.0000 mg | ORAL_TABLET | Freq: Every day | ORAL | Status: DC
  Administered 2016-04-02: 750 mg via ORAL
  Filled 2016-04-02: qty 1

## 2016-04-02 MED ORDER — IOPAMIDOL (ISOVUE-300) INJECTION 61%
INTRAVENOUS | Status: AC
  Filled 2016-04-02: qty 100

## 2016-04-02 NOTE — Progress Notes (Signed)
Attempted report to 5w19. Left number with secretary

## 2016-04-02 NOTE — NC FL2 (Signed)
[place] MEDICAID FL2 LEVEL OF CARE SCREENING TOOL     IDENTIFICATION  Patient Name: [name] Birthdate: 14-Apr-1955 Sex: female Admission Date (Current Location): 03/27/2016  [facility] and [place] Number:  Herbalist and Address:  [facility]. [facility] [place] [address] [place], [place] [address]      Provider Number: [id number]  Attending Physician Name and Address:  Velvet Bathe, MD  Relative Name and Phone Number:       Current Level of Care: Hospital Recommended Level of Care: [place] Prior Approval Number:    Date Approved/Denied:   PASRR Number: [id number]  Discharge Plan: SNF    Current Diagnoses: Patient Active Problem List   Diagnosis Date Noted  . Schizophrenia, paranoid ([place])   . Aspiration pneumonia of left lower lobe due to gastric secretions ([place])   . Projectile vomiting with nausea   . Altered mental status   . Schizophrenia ([place])   . Disorganized schizophrenia ([place])   . Chronic pain syndrome   . Acute renal failure ([place])   . Controlled diabetes mellitus type 2 with complications ([place])   . Acute encephalopathy   . Oropharyngeal dysphagia   . Aspiration pneumonia of right lower lobe due to vomit ([place])   . Acute renal failure (ARF) ([place]) 03/28/2016  . Encephalopathy 03/27/2016  . HCAP (healthcare-associated pneumonia) 02/29/2016  . Pneumonia 02/14/2016  . CAP (community acquired pneumonia) 02/13/2016  . Edema 02/13/2016  . AKI (acute kidney injury) ([place]) 02/13/2016  . Elevated troponin 06/24/2015  . Diarrhea 06/23/2015  . Sepsis ([place]) 06/23/2015  . Obesity, morbid, BMI 50 or higher ([place]) 03/26/2015  . Post traumatic stress disorder (PTSD) 11/14/2014  . Leukocytosis 11/12/2014  . Slurred speech 11/12/2014  . CKD (chronic kidney disease) stage 3, GFR 30-59 ml/min   . Diabetes mellitus, type II, insulin dependent ([place])   . TIA (transient ischemic attack)   . Hypotension 10/28/2014  . Syncope  10/28/2014  . Anemia 10/28/2014  . Type II diabetes mellitus ([place]) 10/28/2014  . OSA on CPAP 10/28/2014  . Chronic lower back pain 10/28/2014  . Polyneuropathy in diabetes(357.2) 10/28/2014  . Diabetic polyneuropathy ([place])   . Fall 08/14/2014  . Recurrent falls 08/14/2014  . Hot flashes 02/24/2014  . Arthralgia 02/24/2014  . Hair loss 02/24/2014  . Chest pain 01/06/2014  . Shock ([place]) 08/08/2013  . Secondary parkinsonism ([place]) 12/30/2012  . Dysarthria 12/30/2012  . Benign paroxysmal positional vertigo 12/30/2012  . Breast cancer of upper-outer quadrant of right female breast ([place]) 11/18/2012  . Hemorrhage of rectum and anus 09/28/2012  . Hx of radiation therapy   . Syncope and collapse 06/04/2011  . HSV 10/05/2008  . Asthma 06/02/2008  . HLD (hyperlipidemia) 06/02/2006  . Sinus tachycardia 05/25/2006  . Hypothyroidism 02/27/2006  . Obesity 02/27/2006  . Depression 02/27/2006  . Essential hypertension 02/27/2006  . GERD 02/27/2006  . KNEE PAIN 02/27/2006    Orientation RESPIRATION BLADDER Height & Weight     Self, Time, Situation, Place  O2, Other (Comment) (2L [place], CPAP at night- has CPAP at home) Incontinent Weight: (!) 334 lb 10.5 oz (151.8 kg) Height:  _0  (165.1 cm)  BEHAVIORAL SYMPTOMS/MOOD NEUROLOGICAL BOWEL NUTRITION STATUS      Continent Diet (see DC summary)  AMBULATORY STATUS COMMUNICATION OF NEEDS Skin   Extensive Assist Verbally Normal  Personal Care Assistance Level of Assistance  Bathing, Dressing, Feeding Bathing Assistance: Maximum assistance Feeding assistance: Independent Dressing Assistance: Maximum assistance     Functional Limitations Info             SPECIAL CARE FACTORS FREQUENCY  PT (By licensed PT), OT (By licensed OT)     PT Frequency: 5/wk OT Frequency: 5/wk            Contractures      Additional Factors Info  Code Status, Allergies, Psychotropic, Isolation Precautions Code Status Info:  FULL Allergies Info: Bee Venom, Shellfish Allergy, Buprenorphine Hcl, Morphine And Related Psychotropic Info: haldol         Current Medications (04/02/2016):  This is the current hospital active medication list Current Facility-Administered Medications  Medication Dose Route Frequency Provider Last Rate Last Dose  . acetaminophen (TYLENOL) tablet 650 mg  650 mg Oral Q6H PRN Karmen Bongo, MD       Or  . acetaminophen (TYLENOL) suppository 650 mg  650 mg Rectal Q6H PRN Karmen Bongo, MD   650 mg at 03/30/16 2243  . carvedilol (COREG) tablet 3.125 mg  3.125 mg Oral BID WC Cherene Altes, MD   3.125 mg at 04/02/16 0930  . chlorhexidine (PERIDEX) 0.12 % solution 15 mL  15 mL Mouth Rinse BID Allie Bossier, MD   15 mL at 04/02/16 0800  . docusate sodium (COLACE) capsule 200 mg  200 mg Oral QHS Cherene Altes, MD   200 mg at 03/31/16 2206  . enoxaparin (LOVENOX) injection 80 mg  80 mg Subcutaneous Q24H Cherene Altes, MD   80 mg at 04/01/16 1328  . furosemide (LASIX) tablet 40 mg  40 mg Oral Daily Cherene Altes, MD   40 mg at 04/02/16 0930  . haloperidol lactate (HALDOL) injection 1 mg  1 mg Intravenous Q12H Allie Bossier, MD   1 mg at 04/02/16 0932  . insulin aspart (novoLOG) injection 0-20 Units  0-20 Units Subcutaneous TID WC Karmen Bongo, MD   4 Units at 04/02/16 0931  . ipratropium-albuterol (DUONEB) 0.5-2.5 (3) MG/3ML nebulizer solution 3 mL  3 mL Nebulization Q4H PRN Allie Bossier, MD      . levothyroxine (SYNTHROID, LEVOTHROID) tablet 150 mcg  150 mcg Oral QAC breakfast Cherene Altes, MD   150 mcg at 04/02/16 0930  . LORazepam (ATIVAN) tablet 1 mg  1 mg Oral QHS Cherene Altes, MD   1 mg at 04/01/16 2304  . MEDLINE mouth rinse  15 mL Mouth Rinse BID Allie Bossier, MD   15 mL at 04/02/16 0931  . morphine 2 MG/ML injection 1 mg  1 mg Intravenous Q6H PRN Allie Bossier, MD   1 mg at 04/02/16 0228  . ondansetron (ZOFRAN) tablet 4 mg  4 mg Oral Q6H PRN Karmen Bongo,  MD       Or  . ondansetron [facility]) injection 4 mg  4 mg Intravenous Q6H PRN Karmen Bongo, MD   4 mg at 04/01/16 1021  . polyethylene glycol (MIRALAX / GLYCOLAX) packet 17 g  17 g Oral Daily Cherene Altes, MD      . sodium chloride flush (NS) 0.9 % injection 3 mL  3 mL Intravenous Q12H Karmen Bongo, MD   3 mL at 04/02/16 0932     Discharge Medications: Please see discharge summary for a list of discharge medications.  Relevant Imaging Results:  Relevant Lab Results:   Additional Information  SS#:  [id number]  Jorge Ny, LCSW

## 2016-04-02 NOTE — Clinical Social Work Note (Signed)
Clinical Social Work Assessment  Patient Details  Name: [name] MRN: [id number] Date of Birth: Sep 17, 1955  Date of referral:  04/02/16               Reason for consult:  Facility Placement                Permission sought to share information with:  Facility Art therapist granted to share information::  Yes, Verbal Permission Granted  Name::        Agency::  SNFs  Relationship::     Contact Information:     Housing/Transportation Living arrangements for the past 2 months:  Single Family Home Source of Information:  Patient Patient Interpreter Needed:  None Criminal Activity/Legal Involvement Pertinent to Current Situation/Hospitalization:  No - Comment as needed Significant Relationships:  Adult Children Lives with:  Self Do you feel safe going back to the place where you live?  No Need for family participation in patient care:     Care giving concerns:  Pt is needing max assist and states she lives alone.   Social Worker assessment / plan:  CSW met with pt regarding PT recommendation for SNF.  Pt non-verbal during interview but nodded and shook head appropriately.  CSW explained recommendation for SNF and SNF referral process.  Employment status:  Disabled (Comment on whether or not currently receiving Disability) Insurance information:  Managed Medicare PT Recommendations:  [place] / Referral to community resources:  [place]  Patient/Family's Response to care:  Pt is agreeable to SNF search for short term rehab after hospital stay.  Patient/Family's Understanding of and Emotional Response to Diagnosis, Current Treatment, and Prognosis:  Unclear understanding- pt seemed somewhat sedated during interview.  Emotional Assessment Appearance:  Appears older than stated age Attitude/Demeanor/Rapport:  Sedated Affect (typically observed):  Quiet Orientation:  Oriented to Situation, Oriented to  Time,  Oriented to Place, Oriented to Self Alcohol / Substance use:  Not Applicable Psych involvement (Current and /or in the community):  No (Comment)  Discharge Needs  Concerns to be addressed:  Care Coordination Readmission within the last 30 days:  Yes Current discharge risk:  Physical Impairment Barriers to Discharge:  Continued Medical Work up   Jorge Ny, LCSW 04/02/2016, 10:03 AM

## 2016-04-02 NOTE — Progress Notes (Signed)
Pharmacy Antibiotic Note  [name] is a 61 y.o. female admitted on 03/27/2016 with AMS, possible pneumonia..  Pt is s/p 5d course of IV antibiotics with WBC increase.  Pharmacy has been consulted for Levaquin dosing.  Plan: Levaquin 753m PO q24h Monitor clinical picture, renal function Follow-up abx stop date.  Height: _0  (165.1 cm) Weight: (!) 334 lb 10.5 oz (151.8 kg) IBW/kg (Calculated) : 57  Temp (24hrs), Avg:98.9 F (37.2 C), Min:97.8 F (36.6 C), Max:100.4 F (38 C)   Recent Labs Lab 03/27/16 1850 03/27/16 1933 03/27/16 2248 03/28/16 0129 03/28/16 0430 03/28/16 0917 03/29/16 0218 03/29/16 1531 03/30/16 1113 03/31/16 0531 04/01/16 0432  WBC 14.8*  --   --   --   --  10.2  10.3 10.9*  --   --  11.3* 15.3*  CREATININE 2.38* 2.40*  --   --   --  2.14*  2.08*  --  1.10* 1.12* 1.08* 1.09*  LATICACIDVEN  --  2.56* 1.16 1.1 0.9  --   --   --   --   --   --     Estimated Creatinine Clearance: 83.3 mL/min (by C-G formula based on SCr of 1.09 mg/dL (H)).    Allergies  Allergen Reactions  . Bee Venom Anaphylaxis  . Shellfish Allergy Anaphylaxis  . Buprenorphine Hcl Other (See Comments)    Overly sedated  . Morphine And Related Other (See Comments)    Overly sedated    Antimicrobials this admission: Levaquin 2/14 >> Cefepime 2/9 >> 2/13 Vancomycin 2/8 >>2/12 Zosyn x 1 on 2/8  Dose adjustments this admission:   Microbiology results: 2/8 BCx: ngtd 2/8 UCx: negative, final 2/8 MRSA PCR: negative  2/8 Influenza PCR: negative 2/9 HIV screen: negative  Thank you for allowing pharmacy to be a part of this patient's care.  [employer] Pharm.D., BCPS Clinical Pharmacist Pager [phone]/14/2018 11:04 AM

## 2016-04-02 NOTE — Progress Notes (Addendum)
PROGRESS NOTE    [name]  TXH:[id number] DOB: [birth year] DOA: 03/27/2016 PCP: [name], MD   Brief Narrative:  61 y.o. BF PMHx Schizophrenia, Anxiety,Depression, CKD stage III, HTN, HLD, Obesity, DM type II uncontrolled with complication, OSA/OHS, morbid obesity, obstructive sleep apnea, diagnosed 2008 Lt Breast cancer Ductal Carcinoma in Situ, S/P lumpectomy + XRT, HSV (herpes simplex virus) infection,Chronic lower back pain   she was recently hospitalized here for community-acquired pneumonia presenting with AMS.  She is unable to answer any questions and is unaccompanied.    HPI for Dr. Tyrone Nine:    [name] with a chief complaint of altered mental status. Family said that they've been acting more confused today. Having some mild cough and side pain. Patient is currently more alert than she had been earlier today. States that she has no issues with her side currently but has been coughing. Having some diffuse myalgias worse to the legs. Family denies any recent medication changes. Has been having multiple falls over the past couple days.  She was previously hospitalized: -1/12-15/18 for sepsis due to HCAP, acute hypoxic respiratory failure, slurred speech, and  AKI on CKD. -12/27-31/17 for sepsis due to multifocal PNA with mild AKI, metabolic encephalopathy; acute on chronic diastolic CHF exacerbation  ED Course: Per Dr. Tyrone Nine: [name] with multiple falls over past couple of days. Patient also has had some transient confusion. Was just in the hospital with hospital acquired pneumonia. Will repeat a chest x-ray obtain a UA. Check basic labs. CT the head with multiple falls recently as well. With generalized weakness will check a troponin. The patient had a reported blood pressure in the field and in the 70s. Here her blood pressure has been in the 202B systolic. Will give a liter of fluids. Send off for cultures.  Patient continues to have a soft blood pressure. Lactate very  minimally elevated. Chest x-ray concerning for possible pneumonia. Patient also has an acute kidney injury. Will give more fluids. Broadly cover with antibiotics as patient was just in the hospital. Discussed with hospitalist   Subjective: Pt has no new complaints today. Daughter reports that patient is not at baseline mentation yet.    Assessment & Plan:   Principal Problem:   Encephalopathy Active Problems:   Hypothyroidism   Anemia   Diabetes mellitus, type II, insulin dependent ([place])   HCAP (healthcare-associated pneumonia)   Acute renal failure (ARF) ([place])   Disorganized schizophrenia ([place])   Chronic pain syndrome   Acute renal failure ([place])   Controlled diabetes mellitus type 2 with complications ([facility])   Acute encephalopathy   Oropharyngeal dysphagia   Aspiration pneumonia of right lower lobe due to vomit ([place])   Altered mental status   Schizophrenia ([place])   Schizophrenia, paranoid ([place])   Aspiration pneumonia of left lower lobe due to gastric secretions ([facility])   Projectile vomiting with nausea   Acute Encephalopathy -Multifactorial (has HCAP, see below); PE; CVA; overdose; renal failure; metabolic encephalopathy. -CT head negative -Patient taking large amounts of narcotics, benzodiazepines, muscle relaxant, antipsychotics which most likely is contributing to her encephalopathy. Will hold as much as I am able without causing withdrawal. 2/13 patient's son concerned that on 2/12 patient's mental status and abrupt change: CVA? Obtain head CT  Schizophrenia -See chronic pain and dysphagia   Chronic pain syndrome  -in order to avoid withdrawal will need to continue some patient's home medications at a lower dose  A. Ativan 1 mg QHS  B. Morphine 1 mg  q 6 hr PRN  C. Haldol IV 2 mg BID  D. hold Lyrica, Zanaflex, Geodon until patent passes swallow study.  Dysphagia -2/9 patient miserably failed bedside swallow. On 2/12 FEES passed. But secondary to projectile vomiting  patient made nothing by mouth again .   HCAP/Aspiration Pneumonia/PE? -Given recent hospitalizations for CAP treat for HCAP. -Most likely secondary to aspiration, secondary to medication overdose, and dysphagia -Cefepime and Vancomycin. Complete 5 day course antibiotics -D5-0.9% saline 114m/hr -DuoNeb QID -Flonase  -Flutter Valve -Sputum cultures pending -Blood cultures NGTD -Strep pneumo/influenza negative -With multiple episodes of PNA in the last 6 weeks. VQ scan pending,  -Lower extremity Doppler negative -VQ scan negative  Addendum: Patient still spiking fevers as such will place back on antibiotics with Levaquin. Obtaining CT scan of abdomen and pelvis as he is complaining of some abdominal discomfort. KUB ordered and reviewed results. Will obtain CT scan of abdomen and pelvis to assess for any source of infection.   ARF(Baseline1.07 on 1/14)  -Creatinine 2.40,  -Rehydrate and follow Lab Results  Component Value Date   CREATININE 1.09 (H) 04/01/2016   CREATININE 1.08 (H) 03/31/2016   CREATININE 1.12 (H) 03/30/2016  -within normal limits with hydration  Anemia -Hgb 7.8, 8.7 on 1/14 -Will follow  Recent Labs Lab 03/27/16 1850 03/27/16 1933 03/28/16 0917 03/29/16 0218 03/31/16 0531 04/01/16 0432  HGB 7.8* 7.8* 7.6*  7.6* 8.5* 8.7* 10.4*   Hypothyroidism -Low TSH in December -Will recheck TSH and free T4 at this time  DM type 2 controlled with complication -AK9X7.3 in May 2017, 6.1 in 12/17 -Will not recheck -Hold long-acting insulin for now, cover with SSI  Hypomagnesemia -reassess level next am.  Projectile Vomiting/Acute Abdominal pain/Diarrhea. -KUB reviewed. Will place order for CT scan -Stool culture pending, C. difficile by PCR is negative.   DVT prophylaxis: Lovenox Code Status: Full Family Communication:  Disposition Plan: Resolution encephalopathy/pneumonia   Consultants:  Dr. JBalinda Quailsat [facility] (not  consulted)    Procedures/Significant Events:  2/8 DG chest:Low lung volumes with borderline cardiomegaly and interstitial edema. Subtle airspace opacities at the lung bases left greater than right cannot exclude superimposed pneumonia 2/9 CT abdomen pelvis WO contrast:-Negative for nephrolithiasis hydronephrosis or ureteral stone. - Mild density in the left > right posterior lower lobes could reflect pneumonia or possible aspiration. 2/10 VQ scan: Negative for PE   VENTILATOR SETTINGS: None   Cultures 2/8 blood NGTD 2/8 urine Negative 2/9 sputum pending  Antimicrobials: Anti-infectives    Start     Stop   03/29/16 0000  ceFEPIme (MAXIPIME) 2 g in dextrose 5 % 50 mL IVPB     04/04/16 2359   03/28/16 2200  vancomycin (VANCOCIN) 1,750 mg in sodium chloride 0.9 % 500 mL IVPB         03/28/16 2000  ceFEPIme (MAXIPIME) 2 g in dextrose 5 % 50 mL IVPB  Status:  Discontinued     03/28/16 1945   03/28/16 0100  ceFEPIme (MAXIPIME) 2 g in dextrose 5 % 50 mL IVPB     03/28/16 0211   03/27/16 2130  vancomycin (VANCOCIN) 2,500 mg in sodium chloride 0.9 % 500 mL IVPB     03/28/16 0125   03/27/16 2115  vancomycin (VANCOCIN) 3,076 mg in sodium chloride 0.9 % 500 mL IVPB  Status:  Discontinued     03/27/16 2109   03/27/16 2115  piperacillin-tazobactam (ZOSYN) IVPB 3.375 g  03/28/16 0013       Devices    LINES / TUBES:   Continuous Infusions:    Objective: Vitals:   04/02/16 1000 04/02/16 1100 04/02/16 1200 04/02/16 1300  BP: (!) 178/87 (!) 160/89 (!) 176/72 (!) 153/80  Pulse:  85 73 87  Resp: 18 (!) _0 Temp:   98.2 F (36.8 C)   TempSrc:   Oral   SpO2:  100% 97% 95%  Weight:      Height:        Intake/Output Summary (Last 24 hours) at 04/02/16 1523 Last data filed at 04/02/16 0932  Gross per 24 hour  Intake              303 ml  Output                0 ml  Net              303 ml   Filed Weights   03/28/16 0626 03/31/16 0600 03/31/16 1629  Weight: (!)  158.8 kg (350 lb 1.6 oz) (!) 150.7 kg (332 lb 3.7 oz) (!) 151.8 kg (334 lb 10.5 oz)    Examination:  General: addendum: pt confused, in NAD Eyes: negative scleral hemorrhage, negative anisocoria, negative icterus ENT: Negative Runny nose, negative gingival bleeding, Neck:  Negative scars, masses, torticollis, lymphadenopathy, JVD Lungs: equal chest rise, no audible wheezes Cardiovascular: Regular rate and rhythm without murmur gallop or rub normal S1 and S2 Abdomen: MORBIDLY OBESE, positive abdominal pain RUQ,LUQ, worse in RLQ, distended, positive soft, bowel sounds, no rebound, no ascites, no appreciable mass Extremities: No significant cyanosis, clubbing, or edema bilateral lower extremities Skin: Negative rashes, lesions, ulcers Psychiatric:  Negative depression, negative anxiety, negative fatigue, negative mania  Central nervous system:  No facial asymmetry, sensation intact throughout,  negative dysarthria, negative expressive aphasia, negative receptive aphasia.  .  Data Reviewed: Care during the described time interval was provided by me .  I have reviewed this patient's available data, including medical history, events of note, physical examination, and all test results as part of my evaluation. I have personally reviewed and interpreted all radiology studies.  CBC:  Recent Labs Lab 03/27/16 1850 03/27/16 1933 03/28/16 0917 03/29/16 0218 03/31/16 0531 04/01/16 0432  WBC 14.8*  --  10.2  10.3 10.9* 11.3* 15.3*  NEUTROABS 12.2*  --  8.3*  --   --   --   HGB 7.8* 7.8* 7.6*  7.6* 8.5* 8.7* 10.4*  HCT 24.8* 23.0* 24.0*  23.8* 27.0* 27.8* 33.1*  MCV 82.7  --  82.8  82.6 83.1 83.5 82.8  PLT 252  --  201  206 224 217 027   Basic Metabolic Panel:  Recent Labs Lab 03/27/16 1850 03/27/16 1933 03/28/16 0917 03/29/16 1531 03/30/16 1113 03/31/16 0531 04/01/16 0432  NA 131* 131* 137  136 143 145  --  145  K 4.7 4.8 3.7  3.6 5.0 4.0  --  3.6  CL 93* 94* 98*  98* 110  112*  --  113*  CO2 26  --  _1 --  23  GLUCOSE 123* 115* 124*  124* 132* 205*  --  196*  BUN 44* 55* 42*  43* 25* 23*  --  32*  CREATININE 2.38* 2.40* 2.14*  2.08* 1.10* 1.12* 1.08* 1.09*  CALCIUM 8.7*  --  8.9  8.9 9.5 9.2  --  8.5*  MG  --   --  1.7 1.7 1.6*  --  2.0   GFR: Estimated Creatinine Clearance: 83.3 mL/min (by C-G formula based on SCr of 1.09 mg/dL (H)). Liver Function Tests:  Recent Labs Lab 03/27/16 1850 03/28/16 0917 03/30/16 1113 04/01/16 0432  AST 32 _0 ALT _1 ALKPHOS 57 55 59 59  BILITOT 0.8 0.5 0.7 0.7  PROT 5.7* 5.8* 6.3* 6.1*  ALBUMIN 3.1* 2.9* 3.2* 2.9*   No results for input(s): LIPASE, AMYLASE in the last 168 hours. No results for input(s): AMMONIA in the last 168 hours. Coagulation Profile: No results for input(s): INR, PROTIME in the last 168 hours. Cardiac Enzymes: No results for input(s): CKTOTAL, CKMB, CKMBINDEX, TROPONINI in the last 168 hours. BNP (last 3 results) No results for input(s): PROBNP in the last 8760 hours. HbA1C: No results for input(s): HGBA1C in the last 72 hours. CBG:  Recent Labs Lab 04/01/16 1219 04/01/16 1654 04/01/16 2224 04/02/16 0838 04/02/16 1302  GLUCAP 192* 136* 141* 160* 139*   Lipid Profile: No results for input(s): CHOL, HDL, LDLCALC, TRIG, CHOLHDL, LDLDIRECT in the last 72 hours. Thyroid Function Tests: No results for input(s): TSH, T4TOTAL, FREET4, T3FREE, THYROIDAB in the last 72 hours. Anemia Panel:  Recent Labs  04/01/16 0432  VITAMINB12 929*  FOLATE 33.0  FERRITIN 31  TIBC 330  IRON 63  RETICCTPCT 2.5   Urine analysis:    Component Value Date/Time   COLORURINE YELLOW 03/27/2016 2020   APPEARANCEUR CLEAR 03/27/2016 2020   APPEARANCEUR Clear 11/13/2013 1310   LABSPEC 1.012 03/27/2016 2020   LABSPEC 1.017 11/13/2013 1310   PHURINE 5.0 03/27/2016 2020   GLUCOSEU NEGATIVE 03/27/2016 2020   GLUCOSEU Negative 11/13/2013 1310   GLUCOSEU NEG mg/dL  03/02/2006 2050   HGBUR NEGATIVE 03/27/2016 2020   BILIRUBINUR NEGATIVE 03/27/2016 2020   BILIRUBINUR Negative 11/13/2013 [place] 03/27/2016 2020   PROTEINUR NEGATIVE 03/27/2016 2020   UROBILINOGEN 0.2 11/12/2014 2057   NITRITE NEGATIVE 03/27/2016 2020   LEUKOCYTESUR NEGATIVE 03/27/2016 2020   LEUKOCYTESUR Trace 11/13/2013 1310   Sepsis Labs: _2 (procalcitonin:4,lacticidven:4)  ) Recent Results (from the past 240 hour(s))  Blood Culture (routine x 2)     Status: None   Collection Time: 03/27/16  6:12 PM  Result Value Ref Range Status   Specimen Description BLOOD RIGHT ANTECUBITAL  Final   Special Requests BOTTLES DRAWN AEROBIC ONLY 10CC  Final   Culture NO GROWTH 5 DAYS  Final   Report Status 04/01/2016 FINAL  Final  Blood Culture (routine x 2)     Status: None   Collection Time: 03/27/16  6:50 PM  Result Value Ref Range Status   Specimen Description BLOOD RIGHT HAND  Final   Special Requests BOTTLES DRAWN AEROBIC AND ANAEROBIC 5CC  Final   Culture NO GROWTH 5 DAYS  Final   Report Status 04/01/2016 FINAL  Final  Urine culture     Status: None   Collection Time: 03/27/16  8:20 PM  Result Value Ref Range Status   Specimen Description URINE, CATHETERIZED  Final   Special Requests NONE  Final   Culture NO GROWTH  Final   Report Status 03/29/2016 FINAL  Final  MRSA PCR Screening     Status: None   Collection Time: 03/28/16  6:30 AM  Result Value Ref Range Status   MRSA by PCR NEGATIVE NEGATIVE Final    Comment:        The GeneXpert MRSA Assay (FDA approved for  NASAL specimens only), is one component of a comprehensive MRSA colonization surveillance program. It is not intended to diagnose MRSA infection nor to guide or monitor treatment for MRSA infections.   C difficile quick scan w PCR reflex     Status: None   Collection Time: 04/01/16 11:37 AM  Result Value Ref Range Status   C Diff antigen NEGATIVE NEGATIVE Final   C Diff toxin NEGATIVE  NEGATIVE Final   C Diff interpretation No C. difficile detected.  Final     Radiology Studies: Dg Abd 1 View  Result Date: 04/02/2016 CLINICAL DATA:  Nausea and vomiting starting yesterday EXAM: ABDOMEN - 1 VIEW COMPARISON:  04/01/2016 FINDINGS: Mild gaseous distended small bowel loops are noted in left mid abdomen suspicious for ileus, enteritis or early bowel obstruction. Some colonic gas noted in right colon and transverse colon. Colonic gas noted within rectum. IMPRESSION: Mild gaseous distended small bowel loops in left mid abdomen suspicious for mild ileus enteritis or less likely small bowel obstruction. Electronically Signed   By: Lahoma Crocker M.D.   On: 04/02/2016 10:36   Ct Head Wo Contrast  Result Date: 04/01/2016 CLINICAL DATA:  Acute onset of altered mental status. Initial encounter. EXAM: CT HEAD WITHOUT CONTRAST TECHNIQUE: Contiguous axial images were obtained from the base of the skull through the vertex without intravenous contrast. COMPARISON:  CT of the head performed 03/27/2016, and MRI of the brain performed 02/29/2016 FINDINGS: Brain: No evidence of acute infarction, hemorrhage, hydrocephalus, extra-axial collection or mass lesion/mass effect. Prominence of the ventricles and sulci reflects mild cortical volume loss. Mild cerebellar atrophy is noted. The brainstem and fourth ventricle are within normal limits. The basal ganglia are unremarkable in appearance. The cerebral hemispheres demonstrate grossly normal gray-white differentiation. No mass effect or midline shift is seen. Vascular: No hyperdense vessel or unexpected calcification. Skull: There is no evidence of fracture; visualized osseous structures are unremarkable in appearance. A mildly prominent empty sella is noted. Sinuses/Orbits: The orbits are within normal limits. The paranasal sinuses and mastoid air cells are well-aerated. Other: No significant soft tissue abnormalities are seen. Prominent cerumen is seen at the  external auditory canals bilaterally, larger on the right. IMPRESSION: 1. No acute intracranial pathology seen on CT. 2. Mild cortical volume loss noted. 3. Prominent cerumen at the external auditory canals bilaterally. Electronically Signed   By: Garald Balding M.D.   On: 04/01/2016 21:53   Dg Abd Portable 1v  Result Date: 04/01/2016 CLINICAL DATA:  Vomiting episodes.  Abdominal pain . EXAM: PORTABLE ABDOMEN - 1 VIEW COMPARISON:  CT 03/28/2016. FINDINGS: Soft tissue structures are unremarkable. Surgical clips right upper quadrant. No bowel distention. No free air. Calcifications in pelvis consistent phleboliths. Degenerative changes lumbar spine and both hips. IMPRESSION: No acute or focal abnormality. Electronically Signed   By: Marcello Moores  Register   On: 04/01/2016 12:18    Scheduled Meds: . carvedilol  3.125 mg Oral BID WC  . chlorhexidine  15 mL Mouth Rinse BID  . docusate sodium  200 mg Oral QHS  . enoxaparin (LOVENOX) injection  80 mg Subcutaneous Q24H  . furosemide  40 mg Oral Daily  . haloperidol lactate  1 mg Intravenous Q12H  . insulin aspart  0-20 Units Subcutaneous TID WC  . levofloxacin  750 mg Oral Daily  . levothyroxine  150 mcg Oral QAC breakfast  . LORazepam  1 mg Oral QHS  . mouth rinse  15 mL Mouth Rinse BID  . polyethylene glycol  17 g Oral Daily  . sodium chloride flush  3 mL Intravenous Q12H   Continuous Infusions:    LOS: 6 days    Time spent: 40 minutes   Velvet Bathe, MD [facility] Pager [phone]  If 7PM-7AM, please contact night-coverage [email or web address] Password [facility] 04/02/2016, 3:23 PM

## 2016-04-03 LAB — COMPREHENSIVE METABOLIC PANEL
ALT: 24 U/L (ref 14–54)
ANION GAP: 10 (ref 5–15)
AST: 19 U/L (ref 15–41)
Albumin: 3 g/dL — ABNORMAL LOW (ref 3.5–5.0)
Alkaline Phosphatase: 53 U/L (ref 38–126)
BUN: 26 mg/dL — ABNORMAL HIGH (ref 6–20)
CALCIUM: 8.4 mg/dL — AB (ref 8.9–10.3)
CHLORIDE: 108 mmol/L (ref 101–111)
CO2: 23 mmol/L (ref 22–32)
Creatinine, Ser: 1.09 mg/dL — ABNORMAL HIGH (ref 0.44–1.00)
GFR calc non Af Amer: 54 mL/min — ABNORMAL LOW (ref >60)
Glucose, Bld: 118 mg/dL — ABNORMAL HIGH (ref 65–99)
Potassium: 4.2 mmol/L (ref 3.5–5.1)
SODIUM: 141 mmol/L (ref 135–145)
Total Bilirubin: 0.9 mg/dL (ref 0.3–1.2)
Total Protein: 5.5 g/dL — ABNORMAL LOW (ref 6.5–8.1)

## 2016-04-03 LAB — GLUCOSE, CAPILLARY
GLUCOSE-CAPILLARY: 112 mg/dL — AB (ref 65–99)
GLUCOSE-CAPILLARY: 127 mg/dL — AB (ref 65–99)
GLUCOSE-CAPILLARY: 120 mg/dL — AB (ref 65–99)
Glucose-Capillary: 151 mg/dL — ABNORMAL HIGH (ref 65–99)

## 2016-04-03 MED ORDER — LORAZEPAM 0.5 MG PO TABS
0.5000 mg | ORAL_TABLET | Freq: Every day | ORAL | Status: DC
  Administered 2016-04-03 – 2016-04-05 (×3): 0.5 mg via ORAL
  Filled 2016-04-03 (×3): qty 1

## 2016-04-03 MED ORDER — CIPROFLOXACIN IN D5W 400 MG/200ML IV SOLN
400.0000 mg | Freq: Two times a day (BID) | INTRAVENOUS | Status: DC
  Administered 2016-04-03 – 2016-04-04 (×3): 400 mg via INTRAVENOUS
  Filled 2016-04-03 (×3): qty 200

## 2016-04-03 MED ORDER — METRONIDAZOLE IN NACL 5-0.79 MG/ML-% IV SOLN
500.0000 mg | Freq: Three times a day (TID) | INTRAVENOUS | Status: DC
  Administered 2016-04-03 – 2016-04-04 (×4): 500 mg via INTRAVENOUS
  Filled 2016-04-03 (×5): qty 100

## 2016-04-03 NOTE — Progress Notes (Signed)
Pharmacy Antibiotic Note  [name] is a 61 y.o. female admitted on 03/27/2016 with AMS.  Pharmacy has been consulted for Cipro dosing for enteritis per CT abdomen. Also on Flagyl. History of multiple episodes of PNA. Renal function down to normal, afebrile, WBC elevated.  Plan: Cipro 400 mg IV q12h Monitor renal function and clinical progress Follow-up LOT  Height: _0  (165.1 cm) Weight: (!) 334 lb 10.5 oz (151.8 kg) IBW/kg (Calculated) : 57  Temp (24hrs), Avg:98.6 F (37 C), Min:98.2 F (36.8 C), Max:99.2 F (37.3 C)   Recent Labs Lab 03/27/16 1850 03/27/16 1933 03/27/16 2248 03/28/16 0129 03/28/16 0430 03/28/16 0917 03/29/16 0218 03/29/16 1531 03/30/16 1113 03/31/16 0531 04/01/16 0432 04/03/16 0517  WBC 14.8*  --   --   --   --  10.2  10.3 10.9*  --   --  11.3* 15.3*  --   CREATININE 2.38* 2.40*  --   --   --  2.14*  2.08*  --  1.10* 1.12* 1.08* 1.09* 1.09*  LATICACIDVEN  --  2.56* 1.16 1.1 0.9  --   --   --   --   --   --   --     Estimated Creatinine Clearance: 83.3 mL/min (by C-G formula based on SCr of 1.09 mg/dL (H)).    Allergies  Allergen Reactions  . Bee Venom Anaphylaxis  . Shellfish Allergy Anaphylaxis  . Buprenorphine Hcl Other (See Comments)    Overly sedated  . Morphine And Related Other (See Comments)    Overly sedated    Antimicrobials this admission: Vancomycin 2/8 >> 2/12 Zosyn 2/8 x1 dose Cefepime 2/9 >> 2/13 Levaquin 2/14 x 1 dose Cipro 2/15 >> Flagyl 2/15 >>  Dose adjustments this admission:   Microbiology results: 2/8 BCx: negative 2/8 UCx: negative, final 2/8 MRSA PCR: negative  2/8 Influenza PCR: negative 2/9 HIV screen: negative 2/13 C diff: negative   Thank you for allowing pharmacy to be a part of this patient's care.  Renold Genta, PharmD, BCPS Clinical Pharmacist Phone for today - [place] - [phone] 04/03/2016 9:50 AM

## 2016-04-03 NOTE — Progress Notes (Signed)
Speech Language Pathology Treatment: Dysphagia  Patient Details Name: [name] MRN: [id number] DOB: October 10, 1955 Today's Date: 04/03/2016 Time: 8329-1916 SLP Time Calculation (min) (ACUTE ONLY): 14 min  Assessment / Plan / Recommendation Clinical Impression  Skilled dysphagia treatment with Dys 2 texture (minced/ground) and thin liquids with complaints of 10/10 back pain with head of bed elevated and bed placed in reverse Trendelenburg position   (RN notified). Pt has been on clear liquids since 2/13 after episode of projective vomiting per nurse. Pt's mastication of pudding mixed with pieces of graham cracker was mild-moderately prolonged. Mildly increased work of breathing and decreased respiratory coordination with thin. Recommend rest breaks if needed. Text paged MD for Dys 2 texture order if he agrees. Continue treatment and swallow precautions.   HPI HPI: 61 year old female admitted 03/27/16 due to AMS, confusion. PMH significant for GERD, HLD, secondary Parksinsonism, scizophrenia, CKD3, HTN, OSA/obseity (339#), recurrent PNA, recurrent falls. CXR reveals subtle airspace opacities BLL L>R, PNA cannot be excluded.       SLP Plan  Continue with current plan of care     Recommendations  Diet recommendations: Dysphagia 2 (fine chop) Liquids provided via: Cup;Straw Medication Administration: Whole meds with puree Supervision: Patient able to self feed;Intermittent supervision to cue for compensatory strategies Compensations: Slow rate;Small sips/bites;Minimize environmental distractions Postural Changes and/or Swallow Maneuvers: Seated upright 90 degrees                Oral Care Recommendations: Oral care BID Follow up Recommendations: Skilled Nursing facility Plan: Continue with current plan of care       [place], Nitya Cauthon Willis 04/03/2016, 11:09 AM   Orbie Pyo Colvin Caroli.Ed [employer] [phone]

## 2016-04-03 NOTE — Progress Notes (Signed)
PROGRESS NOTE    [name]  YKD:[id number] DOB: Nov 11, 1955 DOA: 03/27/2016 PCP: [name], MD   Brief Narrative:  61 y.o. BF PMHx Schizophrenia, Anxiety,Depression, CKD stage III, HTN, HLD, Obesity, DM type II uncontrolled with complication, OSA/OHS, morbid obesity, obstructive sleep apnea, diagnosed 2008 Lt Breast cancer Ductal Carcinoma in Situ, S/P lumpectomy + XRT, HSV (herpes simplex virus) infection,Chronic lower back pain   she was recently hospitalized here for community-acquired pneumonia presenting with AMS.  She is unable to answer any questions and is unaccompanied.    HPI for Dr. Tyrone Nine:    [name] with a chief complaint of altered mental status. Family said that they've been acting more confused today. Having some mild cough and side pain. Patient is currently more alert than she had been earlier today. States that she has no issues with her side currently but has been coughing. Having some diffuse myalgias worse to the legs. Family denies any recent medication changes. Has been having multiple falls over the past couple days.  She was previously hospitalized: -1/12-15/18 for sepsis due to HCAP, acute hypoxic respiratory failure, slurred speech, and  AKI on CKD. -12/27-31/17 for sepsis due to multifocal PNA with mild AKI, metabolic encephalopathy; acute on chronic diastolic CHF exacerbation  ED Course: Per Dr. Tyrone Nine: [name] with multiple falls over past couple of days. Patient also has had some transient confusion. Was just in the hospital with hospital acquired pneumonia. Will repeat a chest x-ray obtain a UA. Check basic labs. CT the head with multiple falls recently as well. With generalized weakness will check a troponin. The patient had a reported blood pressure in the field and in the 70s. Here her blood pressure has been in the 397Q systolic. Will give a liter of fluids. Send off for cultures.  Patient continues to have a soft blood pressure. Lactate very  minimally elevated. Chest x-ray concerning for possible pneumonia. Patient also has an acute kidney injury. Will give more fluids. Broadly cover with antibiotics as patient was just in the hospital. Discussed with hospitalist  Subjective: Pt has no new complaints today. Still having confusion  Assessment & Plan:   Principal Problem:   Encephalopathy Active Problems:   Hypothyroidism   Anemia   Diabetes mellitus, type II, insulin dependent ([place])   HCAP (healthcare-associated pneumonia)   Acute renal failure (ARF) ([place])   Disorganized schizophrenia ([place])   Chronic pain syndrome   Acute renal failure ([place])   Controlled diabetes mellitus type 2 with complications (H[facility])   Acute encephalopathy   Oropharyngeal dysphagia   Aspiration pneumonia of right lower lobe due to vomit ([place])   Altered mental status   Schizophrenia ([place])   Schizophrenia, paranoid ([place])   Aspiration pneumonia of left lower lobe due to gastric secretions (H[facility])   Projectile vomiting with nausea   Acute Encephalopathy -Multifactorial (has HCAP, see below); PE; CVA; overdose; renal failure; metabolic encephalopathy. -CT head negative -May be secondary to infectious etiology. Will place on abx's see below  Schizophrenia -See chronic pain and dysphagia   Chronic pain syndrome  -in order to avoid withdrawal will need to continue some patient's home medications at a lower dose  A. Ativan 1 mg QHS  B. Morphine 1 mg  q 6 hr PRN  C. Haldol IV 2 mg BID  D. hold Lyrica, Zanaflex, Geodon until patent passes swallow study.  Dysphagia -2/9 patient miserably failed bedside swallow. On 2/12 FEES passed. But secondary to projectile  vomiting patient made nothing by mouth again .   HCAP/Aspiration Pneumonia/PE? -Given recent hospitalizations for CAP treat for HCAP. -Most likely secondary to aspiration, secondary to medication overdose, and dysphagia -Cefepime and Vancomycin. Complete 5 day course antibiotics -D5-0.9%  saline 148m/hr -DuoNeb QID -Flonase  -Flutter Valve -Sputum cultures pending -Blood cultures NGTD -Strep pneumo/influenza negative -With multiple episodes of PNA in the last 6 weeks. VQ scan pending,  -Lower extremity Doppler negative -VQ scan negative  Enteritis - may be contributing to principle problem - start patient on cipro and flagyl   ARF(Baseline1.07 on 1/14)  -Creatinine 2.40,  -Rehydrate and follow Lab Results  Component Value Date   CREATININE 1.09 (H) 04/03/2016   CREATININE 1.09 (H) 04/01/2016   CREATININE 1.08 (H) 03/31/2016  -within normal limits with hydration  Anemia -Hgb 7.8, 8.7 on 1/14 -Will follow  Recent Labs Lab 03/27/16 1850 03/27/16 1933 03/28/16 0917 03/29/16 0218 03/31/16 0531 04/01/16 0432  HGB 7.8* 7.8* 7.6*  7.6* 8.5* 8.7* 10.4*   Hypothyroidism -Low TSH in December -Will recheck TSH and free T4 at this time  DM type 2 controlled with complication -AW1X7.3 in May 2017, 6.1 in 12/17 -Will not recheck -Hold long-acting insulin for now, cover with SSI  Hypomagnesemia -reassess level next am.  Projectile Vomiting/Acute Abdominal pain/Diarrhea. -KUB reviewed. Will place order for CT scan -Stool culture pending, C. difficile by PCR is negative.  DVT prophylaxis: Lovenox Code Status: Full Family Communication: none at bedside. Disposition Plan: Resolution encephalopathy/pneumonia   Consultants:  [facility]inda [facility]  Procedures/Significant Events:  2/8 DG chest:Low lung volumes with borderline cardiomegaly and interstitial edema. Subtle airspace opacities at the lung bases left greater than right cannot exclude superimposed pneumonia 2/9 CT abdomen pelvis WO contrast:-Negative for nephrolithiasis hydronephrosis or ureteral stone. - Mild density in the left > right posterior lower lobes could reflect pneumonia or possible aspiration. 2/10 VQ scan: Negative for PE   VENTILATOR  SETTINGS: None   Cultures 2/8 blood NGTD 2/8 urine Negative 2/9 sputum pending  Antimicrobials: Anti-infectives    Start     Stop   03/29/16 0000  ceFEPIme (MAXIPIME) 2 g in dextrose 5 % 50 mL IVPB     04/04/16 2359   03/28/16 2200  vancomycin (VANCOCIN) 1,750 mg in sodium chloride 0.9 % 500 mL IVPB         03/28/16 2000  ceFEPIme (MAXIPIME) 2 g in dextrose 5 % 50 mL IVPB  Status:  Discontinued     03/28/16 1945   03/28/16 0100  ceFEPIme (MAXIPIME) 2 g in dextrose 5 % 50 mL IVPB     03/28/16 0211   03/27/16 2130  vancomycin (VANCOCIN) 2,500 mg in sodium chloride 0.9 % 500 mL IVPB     03/28/16 0125   03/27/16 2115  vancomycin (VANCOCIN) 3,076 mg in sodium chloride 0.9 % 500 mL IVPB  Status:  Discontinued     03/27/16 2109   03/27/16 2115  piperacillin-tazobactam (ZOSYN) IVPB 3.375 g     03/28/16 0013       Devices    LINES / TUBES:   Continuous Infusions:    Objective: Vitals:   04/02/16 1929 04/02/16 2223 04/03/16 0618 04/03/16 1343  BP: (!) 161/83 (!) 152/52 (!) 158/86 (!) 169/83  Pulse: 82 83 95 88  Resp: (!) 30 20 (!) 21 18  Temp: 98.4 F (36.9 C) 99.2 F (37.3 C) 98.7 F (37.1 C) 98.2 F (36.8 C)  TempSrc: Oral  Oral Oral  SpO2: 100% 100% 100% 100%  Weight:      Height:        Intake/Output Summary (Last 24 hours) at 04/03/16 1750 Last data filed at 04/02/16 1900  Gross per 24 hour  Intake              480 ml  Output                0 ml  Net              480 ml   Filed Weights   03/28/16 0626 03/31/16 0600 03/31/16 1629  Weight: (!) 158.8 kg (350 lb 1.6 oz) (!) 150.7 kg (332 lb 3.7 oz) (!) 151.8 kg (334 lb 10.5 oz)    Examination:  General: awake and alert, in NAD Eyes: negative scleral hemorrhage, negative anisocoria, negative icterus ENT: Negative Runny nose, negative gingival bleeding, Neck:  Negative scars, masses, torticollis, lymphadenopathy, JVD Lungs: equal chest rise, no audible wheezes Cardiovascular: Regular rate and rhythm  without murmur gallop or rub normal S1 and S2 Abdomen: MORBIDLY OBESE, positive abdominal pain worse in RLQ, distended, positive soft, bowel sounds, no rebound, no ascites, no appreciable mass Extremities: No significant cyanosis, clubbing, or edema bilateral lower extremities Skin: Negative rashes, lesions, ulcers Psychiatric:  Negative depression, negative anxiety, negative fatigue, negative mania  Central nervous system:  No facial asymmetry, sensation intact throughout,  negative dysarthria, negative expressive aphasia, negative receptive aphasia.  .  Data Reviewed: Care during the described time interval was provided by me .  I have reviewed this patient's available data, including medical history, events of note, physical examination, and all test results as part of my evaluation. I have personally reviewed and interpreted all radiology studies.  CBC:  Recent Labs Lab 03/27/16 1850 03/27/16 1933 03/28/16 0917 03/29/16 0218 03/31/16 0531 04/01/16 0432  WBC 14.8*  --  10.2  10.3 10.9* 11.3* 15.3*  NEUTROABS 12.2*  --  8.3*  --   --   --   HGB 7.8* 7.8* 7.6*  7.6* 8.5* 8.7* 10.4*  HCT 24.8* 23.0* 24.0*  23.8* 27.0* 27.8* 33.1*  MCV 82.7  --  82.8  82.6 83.1 83.5 82.8  PLT 252  --  201  206 224 217 699   Basic Metabolic Panel:  Recent Labs Lab 03/28/16 0917 03/29/16 1531 03/30/16 1113 03/31/16 0531 04/01/16 0432 04/02/16 1639 04/03/16 0517  NA 137  136 143 145  --  145  --  141  K 3.7  3.6 5.0 4.0  --  3.6  --  4.2  CL 98*  98* 110 112*  --  113*  --  108  CO2 _0 --  23  --  23  GLUCOSE 124*  124* 132* 205*  --  196*  --  118*  BUN 42*  43* 25* 23*  --  32*  --  26*  CREATININE 2.14*  2.08* 1.10* 1.12* 1.08* 1.09*  --  1.09*  CALCIUM 8.9  8.9 9.5 9.2  --  8.5*  --  8.4*  MG 1.7 1.7 1.6*  --  2.0 2.0  --    GFR: Estimated Creatinine Clearance: 83.3 mL/min (by C-G formula based on SCr of 1.09 mg/dL (H)). Liver Function Tests:  Recent  Labs Lab 03/27/16 1850 03/28/16 0917 03/30/16 1113 04/01/16 0432 04/03/16 0517  AST 32 _1 ALT _2 24  ALKPHOS 57 55 59 59 53  BILITOT 0.8 0.5 0.7 0.7 0.9  PROT 5.7* 5.8* 6.3* 6.1* 5.5*  ALBUMIN 3.1* 2.9* 3.2* 2.9* 3.0*   No results for input(s): LIPASE, AMYLASE in the last 168 hours. No results for input(s): AMMONIA in the last 168 hours. Coagulation Profile: No results for input(s): INR, PROTIME in the last 168 hours. Cardiac Enzymes: No results for input(s): CKTOTAL, CKMB, CKMBINDEX, TROPONINI in the last 168 hours. BNP (last 3 results) No results for input(s): PROBNP in the last 8760 hours. HbA1C: No results for input(s): HGBA1C in the last 72 hours. CBG:  Recent Labs Lab 04/02/16 1715 04/02/16 2129 04/03/16 0813 04/03/16 1325 04/03/16 1739  GLUCAP 123* 115* 112* 127* 120*   Lipid Profile: No results for input(s): CHOL, HDL, LDLCALC, TRIG, CHOLHDL, LDLDIRECT in the last 72 hours. Thyroid Function Tests: No results for input(s): TSH, T4TOTAL, FREET4, T3FREE, THYROIDAB in the last 72 hours. Anemia Panel:  Recent Labs  04/01/16 0432  VITAMINB12 929*  FOLATE 33.0  FERRITIN 31  TIBC 330  IRON 63  RETICCTPCT 2.5   Urine analysis:    Component Value Date/Time   COLORURINE YELLOW 03/27/2016 2020   APPEARANCEUR CLEAR 03/27/2016 2020   APPEARANCEUR Clear 11/13/2013 1310   LABSPEC 1.012 03/27/2016 2020   LABSPEC 1.017 11/13/2013 1310   PHURINE 5.0 03/27/2016 2020   GLUCOSEU NEGATIVE 03/27/2016 2020   GLUCOSEU Negative 11/13/2013 1310   GLUCOSEU NEG mg/dL 03/02/2006 2050   HGBUR NEGATIVE 03/27/2016 2020   BILIRUBINUR NEGATIVE 03/27/2016 2020   BILIRUBINUR Negative 11/13/2013 [place] 03/27/2016 2020   PROTEINUR NEGATIVE 03/27/2016 2020   UROBILINOGEN 0.2 11/12/2014 2057   NITRITE NEGATIVE 03/27/2016 2020   LEUKOCYTESUR NEGATIVE 03/27/2016 2020   LEUKOCYTESUR Trace 11/13/2013 1310   Sepsis  Labs: _0 (procalcitonin:4,lacticidven:4)  ) Recent Results (from the past 240 hour(s))  Blood Culture (routine x 2)     Status: None   Collection Time: 03/27/16  6:12 PM  Result Value Ref Range Status   Specimen Description BLOOD RIGHT ANTECUBITAL  Final   Special Requests BOTTLES DRAWN AEROBIC ONLY 10CC  Final   Culture NO GROWTH 5 DAYS  Final   Report Status 04/01/2016 FINAL  Final  Blood Culture (routine x 2)     Status: None   Collection Time: 03/27/16  6:50 PM  Result Value Ref Range Status   Specimen Description BLOOD RIGHT HAND  Final   Special Requests BOTTLES DRAWN AEROBIC AND ANAEROBIC 5CC  Final   Culture NO GROWTH 5 DAYS  Final   Report Status 04/01/2016 FINAL  Final  Urine culture     Status: None   Collection Time: 03/27/16  8:20 PM  Result Value Ref Range Status   Specimen Description URINE, CATHETERIZED  Final   Special Requests NONE  Final   Culture NO GROWTH  Final   Report Status 03/29/2016 FINAL  Final  MRSA PCR Screening     Status: None   Collection Time: 03/28/16  6:30 AM  Result Value Ref Range Status   MRSA by PCR NEGATIVE NEGATIVE Final    Comment:        The GeneXpert MRSA Assay (FDA approved for NASAL specimens only), is one component of a comprehensive MRSA colonization surveillance program. It is not intended to diagnose MRSA infection nor to guide or monitor treatment for MRSA infections.   C difficile quick scan w PCR reflex     Status: None   Collection Time:  04/01/16 11:37 AM  Result Value Ref Range Status   C Diff antigen NEGATIVE NEGATIVE Final   C Diff toxin NEGATIVE NEGATIVE Final   C Diff interpretation No C. difficile detected.  Final     Radiology Studies: Dg Abd 1 View  Result Date: 04/02/2016 CLINICAL DATA:  Nausea and vomiting starting yesterday EXAM: ABDOMEN - 1 VIEW COMPARISON:  04/01/2016 FINDINGS: Mild gaseous distended small bowel loops are noted in left mid abdomen suspicious for ileus, enteritis or early  bowel obstruction. Some colonic gas noted in right colon and transverse colon. Colonic gas noted within rectum. IMPRESSION: Mild gaseous distended small bowel loops in left mid abdomen suspicious for mild ileus enteritis or less likely small bowel obstruction. Electronically Signed   By: Lahoma Crocker M.D.   On: 04/02/2016 10:36   Ct Head Wo Contrast  Result Date: 04/01/2016 CLINICAL DATA:  Acute onset of altered mental status. Initial encounter. EXAM: CT HEAD WITHOUT CONTRAST TECHNIQUE: Contiguous axial images were obtained from the base of the skull through the vertex without intravenous contrast. COMPARISON:  CT of the head performed 03/27/2016, and MRI of the brain performed 02/29/2016 FINDINGS: Brain: No evidence of acute infarction, hemorrhage, hydrocephalus, extra-axial collection or mass lesion/mass effect. Prominence of the ventricles and sulci reflects mild cortical volume loss. Mild cerebellar atrophy is noted. The brainstem and fourth ventricle are within normal limits. The basal ganglia are unremarkable in appearance. The cerebral hemispheres demonstrate grossly normal gray-white differentiation. No mass effect or midline shift is seen. Vascular: No hyperdense vessel or unexpected calcification. Skull: There is no evidence of fracture; visualized osseous structures are unremarkable in appearance. A mildly prominent empty sella is noted. Sinuses/Orbits: The orbits are within normal limits. The paranasal sinuses and mastoid air cells are well-aerated. Other: No significant soft tissue abnormalities are seen. Prominent cerumen is seen at the external auditory canals bilaterally, larger on the right. IMPRESSION: 1. No acute intracranial pathology seen on CT. 2. Mild cortical volume loss noted. 3. Prominent cerumen at the external auditory canals bilaterally. Electronically Signed   By: Garald Balding M.D.   On: 04/01/2016 21:53   Ct Abdomen Pelvis W Contrast  Result Date: 04/02/2016 CLINICAL DATA:   Pneumonia, altered mental status.  Abdominal pain. EXAM: CT ABDOMEN AND PELVIS WITH CONTRAST TECHNIQUE: Multidetector CT imaging of the abdomen and pelvis was performed using the standard protocol following bolus administration of intravenous contrast. CONTRAST:  100 cc Isovue 300 IV COMPARISON:  Same day KUB, CT from 03/28/2016 FINDINGS: Lower chest: Small pleural effusions bilaterally slightly increased since prior exam with adjacent atelectasis. Borderline cardiomegaly. No pericardial effusion. Hepatobiliary: Cholecystectomy. No focal liver lesion or biliary dilatation. Pancreas: Fatty atrophy of the pancreas without focal mass or ductal dilatation. No surrounding peripancreatic inflammation. Spleen: Normal size without focal abnormality. Adrenals/Urinary Tract: Normal bilateral adrenal glands and kidneys without obstructive uropathy. Physiologically distended appearance of the bladder. Stomach/Bowel: Contracted stomach. Normal small bowel rotation. Mild jejunal thickening without significant fluid-filled distention. Enteric contrast passes into large bowel. No evidence acute appendicitis. Vascular/Lymphatic: Aortic atherosclerosis. No enlarged abdominal or pelvic lymph nodes. Reproductive: Status post hysterectomy. No adnexal masses. Other: Small fat containing umbilical hernia. No free air nor free fluid. Left lower quadrant iatrogenic subcutaneous emphysema likely related injection subcutaneous medication. Musculoskeletal: Degenerate changes of the dorsal spine. No acute nor suspicious osseous lesions. IMPRESSION: 1. Moderate diffuse thickening of jejunum since prior exam suspicious for enteritis. No mechanical bowel obstruction or pneumatosis. 2. Slight increase in small bilateral  pleural effusions. Bibasilar atelectasis without pneumonic consolidations. 3. Small fat containing umbilical hernia. Electronically Signed   By: Ashley Royalty M.D.   On: 04/02/2016 22:51    Scheduled Meds: . carvedilol  3.125 mg  Oral BID WC  . chlorhexidine  15 mL Mouth Rinse BID  . ciprofloxacin  400 mg Intravenous Q12H  . docusate sodium  200 mg Oral QHS  . enoxaparin (LOVENOX) injection  80 mg Subcutaneous Q24H  . furosemide  40 mg Oral Daily  . haloperidol lactate  1 mg Intravenous Q12H  . insulin aspart  0-20 Units Subcutaneous TID WC  . levothyroxine  150 mcg Oral QAC breakfast  . LORazepam  1 mg Oral QHS  . mouth rinse  15 mL Mouth Rinse BID  . metronidazole  500 mg Intravenous Q8H  . polyethylene glycol  17 g Oral Daily  . sodium chloride flush  3 mL Intravenous Q12H   Continuous Infusions:    LOS: 7 days    Time spent: 40 minutes   Velvet Bathe, MD [facility] Pager [phone]  If 7PM-7AM, please contact night-coverage [email or web address] Password [email or web address] 04/03/2016, 5:50 PM

## 2016-04-03 NOTE — Care Management Note (Signed)
Case Management Note  Patient Details  Name: RHIANNA RAULERSON MRN: [id number] Date of Birth: 03-17-1955  Subjective/Objective:                 Patient transferred to 5W, continues IV Abx with fevers per MD note.    Action/Plan:  Anticipate DC to SNF, when medically clear, facilitated by CSW.  Expected Discharge Date:  03/31/16               Expected Discharge Plan:  Skilled Nursing Facility  In-House Referral:  Clinical Social Work  Discharge planning Services  CM Consult  Post Acute Care Choice:    Choice offered to:     DME Arranged:    DME Agency:     HH Arranged:    [place] Agency:     Status of Service:  Completed, signed off  If discussed at [employer], dates discussed:    Additional Comments:  Carles Collet, RN 04/03/2016, 12:53 PM

## 2016-04-03 NOTE — Consult Note (Signed)
   [facility] Inpatient Consult   04/03/2016  SHARRAN CARATACHEA 1955/04/29 [id number]  Patient was assessed for [facility] for community services with [facility].This is the patient's 3rd admission in 6 months.  Patient was previously active with [facility]. Patient has transferred to Med-Surg from SDU.  Went to see patient at bedside regarding being restarted with [facility] services. Patient was sleeping soundly.  Chart review reveals the patient is a 61 y.o.BF PMHx Schizophrenia, Anxiety,Depression, CKD stage III, HTN, HLD, Obesity, DM type II uncontrolled with complication, OSA/OHS, morbid obesity, obstructive sleep apnea, diagnosed 2008 Lt Breast cancer Ductal Carcinoma in Situ, S/P lumpectomy + XRT, HSV (herpes simplex virus) infection,Chronic lower back pain with recently hospitalized for community-acquired pneumonia with altered mental status.  Chart review reveals that the patient is planned for a discharge to a skilled facility when medically ready. Will follow for disposition, if [facility] needs are identified.  Please contact:  Natividad Brood, RN BSN [facility] Liaison  [phone] business mobile phone Toll free office [phone]

## 2016-04-03 NOTE — Care Management Important Message (Signed)
Important Message  Patient Details  Name: [name] MRN: [id number] Date of Birth: 16-Jan-1956   Medicare Important Message Given:  Yes    Nathen May 04/03/2016, 12:02 PM

## 2016-04-04 LAB — GLUCOSE, CAPILLARY
GLUCOSE-CAPILLARY: 136 mg/dL — AB (ref 65–99)
GLUCOSE-CAPILLARY: 160 mg/dL — AB (ref 65–99)
GLUCOSE-CAPILLARY: 123 mg/dL — AB (ref 65–99)
GLUCOSE-CAPILLARY: 151 mg/dL — AB (ref 65–99)

## 2016-04-04 MED ORDER — METRONIDAZOLE 500 MG PO TABS
500.0000 mg | ORAL_TABLET | Freq: Three times a day (TID) | ORAL | Status: DC
  Administered 2016-04-05 – 2016-04-07 (×8): 500 mg via ORAL
  Filled 2016-04-04 (×8): qty 1

## 2016-04-04 MED ORDER — MORPHINE SULFATE (PF) 2 MG/ML IV SOLN
1.0000 mg | Freq: Once | INTRAVENOUS | Status: AC
  Administered 2016-04-04: 1 mg via INTRAVENOUS
  Filled 2016-04-04: qty 1

## 2016-04-04 MED ORDER — CIPROFLOXACIN HCL 500 MG PO TABS
500.0000 mg | ORAL_TABLET | Freq: Two times a day (BID) | ORAL | Status: DC
  Administered 2016-04-04 – 2016-04-07 (×6): 500 mg via ORAL
  Filled 2016-04-04 (×6): qty 1

## 2016-04-04 NOTE — Progress Notes (Signed)
Patient has refused CPAP HS. Patient is in no distress.

## 2016-04-04 NOTE — Progress Notes (Signed)
Occupational Therapy Treatment Patient Details Name: [name] MRN: [id number] DOB: 01/04/1956 Today's Date: 04/04/2016    History of present illness Pt admitted with AMS thought to be due to polypharmacy. PMH: recurrent PNA, chronic back pain, BPPV, morbid obesity,schizophrenia, secondary Parkinsonism.   OT comments  Pt participated with BUE strengthening and hygiene. Attempted rolling with Max A, but unable. Encouraged pt to move in bed and participate with ADL. Daughter present during session. Will continue to follow to address established goals.   Follow Up Recommendations  SNF;Supervision/Assistance - 24 hour    Equipment Recommendations  None recommended by OT    Recommendations for Other Services      Precautions / Restrictions Precautions Precautions: Fall       Mobility Bed Mobility               General bed mobility comments: Attenpted to roll for pericare - pt unable to roll with max A of 1  Transfers                      Balance                                   ADL Overall ADL's : Needs assistance/impaired     Grooming: Wash/dry hands;Wash/dry face;Set up                       [place] and Hygiene: Maximal assistance Toileting - Clothing Manipulation Details (indicate cue type and reason): used female urinal with assistance for placement. Pt assited with pericare @ bed level              Vision                     Perception     Praxis      Cognition   Behavior During Therapy: Flat affect Overall Cognitive Status: Impaired/Different from baseline Area of Impairment: Problem solving;Following commands        Following Commands: Follows one step commands with increased time     Problem Solving: Slow processing;Decreased initiation;Requires verbal cues;Requires tactile cues      Extremity/Trunk Assessment   BUE generalized weakness. States she has a R wrist  "deep tissue bruise"            Exercises General Exercises - Upper Extremity Shoulder Flexion: AROM;Strengthening;Both;10 reps;Supine Shoulder ABduction: Strengthening;Both;10 reps;Supine Elbow Flexion: Strengthening;Both;10 reps;Supine Elbow Extension: Strengthening;Both;10 reps;Supine   Shoulder Instructions       General Comments      Pertinent Vitals/ Pain       Pain Assessment: 0-10 Pain Score: 10-Worst pain ever Pain Location: back Pain Descriptors / Indicators: Moaning;Grimacing Pain Intervention(s): Limited activity within patient's tolerance  Home Living                                          Prior Functioning/Environment              Frequency  Min 2X/week        Progress Toward Goals  OT Goals(current goals can now be found in the care plan section) . Progress towards OT goals: Progressing toward goals  Acute Rehab OT Goals Patient Stated Goal: not stated OT Goal Formulation: With patient Time For  Goal Achievement: 04/15/16 Potential to Achieve Goals: Fair ADL Goals Pt Will Perform Grooming: with min assist;sitting Pt Will Transfer to Toilet: with +2 assist;with min assist;bedside commode;stand pivot transfer Pt Will Perform Toileting - Clothing Manipulation and hygiene: with mod assist;sit to/from stand Additional ADL Goal #1: Pt will perform bed mobility using log roll technique with +2 min assist in preparation for ADL at EOB.  Plan Discharge plan remains appropriate    Co-evaluation                 End of Session Equipment Utilized During Treatment: Oxygen (2L)   Activity Tolerance Patient tolerated treatment well   Patient Left in bed;with call bell/phone within reach;with family/visitor present   Nurse Communication Mobility status        Time: 2297-9892 OT Time Calculation (min): 18 min  Charges: OT General Charges $OT Visit: 1 Procedure OT Treatments $Therapeutic Activity: 8-22  mins  Stefon Ramthun,HILLARY 04/04/2016, 1:14 PM  [facility], OT/L  [phone] 04/04/2016

## 2016-04-04 NOTE — Progress Notes (Signed)
PROGRESS NOTE    [name]  KGU:[id number] DOB: 11-21-1955 DOA: 03/27/2016 PCP: [name], MD   Brief Narrative:  61 y.o. BF PMHx Schizophrenia, Anxiety,Depression, CKD stage III, HTN, HLD, Obesity, DM type II uncontrolled with complication, OSA/OHS, morbid obesity, obstructive sleep apnea, diagnosed 2008 Lt Breast cancer Ductal Carcinoma in Situ, S/P lumpectomy + XRT, HSV (herpes simplex virus) infection,Chronic lower back pain   she was recently hospitalized here for community-acquired pneumonia presenting with AMS.  She is unable to answer any questions and is unaccompanied.    HPI for Dr. Tyrone Nine:    [name] with a chief complaint of altered mental status. Family said that they've been acting more confused today. Having some mild cough and side pain. Patient is currently more alert than she had been earlier today. States that she has no issues with her side currently but has been coughing. Having some diffuse myalgias worse to the legs. Family denies any recent medication changes. Has been having multiple falls over the past couple days.  She was previously hospitalized: -1/12-15/18 for sepsis due to HCAP, acute hypoxic respiratory failure, slurred speech, and  AKI on CKD. -12/27-31/17 for sepsis due to multifocal PNA with mild AKI, metabolic encephalopathy; acute on chronic diastolic CHF exacerbation  ED Course: Per Dr. Tyrone Nine: [name] with multiple falls over past couple of days. Patient also has had some transient confusion. Was just in the hospital with hospital acquired pneumonia. Will repeat a chest x-ray obtain a UA. Check basic labs. CT the head with multiple falls recently as well. With generalized weakness will check a troponin. The patient had a reported blood pressure in the field and in the 70s. Here her blood pressure has been in the 628B systolic. Will give a liter of fluids. Send off for cultures.  Patient continues to have a soft blood pressure. Lactate very  minimally elevated. Chest x-ray concerning for possible pneumonia. Patient also has an acute kidney injury. Will give more fluids. Broadly cover with antibiotics as patient was just in the hospital. Discussed with hospitalist  Subjective: Pt more alert. Asking when patient can go home. Still some somnolence  Assessment & Plan:   Principal Problem:   Encephalopathy Active Problems:   Hypothyroidism   Anemia   Diabetes mellitus, type II, insulin dependent ([place])   HCAP (healthcare-associated pneumonia)   Acute renal failure (ARF) ([place])   Disorganized schizophrenia ([place])   Chronic pain syndrome   Acute renal failure ([place])   Controlled diabetes mellitus type 2 with complications ([facility])   Acute encephalopathy   Oropharyngeal dysphagia   Aspiration pneumonia of right lower lobe due to vomit ([place])   Altered mental status   Schizophrenia ([place])   Schizophrenia, paranoid ([place])   Aspiration pneumonia of left lower lobe due to gastric secretions ([facility])   Projectile vomiting with nausea   Acute Encephalopathy -Multifactorial (has HCAP, see below); PE; CVA; overdose; renal failure; metabolic encephalopathy. -CT head negative -May be secondary to infectious etiology. Improving on antibiotics and limiting polypharmacy  Schizophrenia -See chronic pain and dysphagia   Chronic pain syndrome  -in order to avoid withdrawal will need to continue some patient's home medications at a lower dose  A. Ativan 1 mg QHS  B. Morphine 1 mg  q 6 hr PRN  C. Haldol IV 2 mg BID  D. hold Lyrica, Zanaflex, Geodon until patent passes swallow study.  Dysphagia -2/9 patient miserably failed bedside swallow. On 2/12 FEES passed. But  secondary to projectile vomiting patient made nothing by mouth again .   HCAP/Aspiration Pneumonia/PE? -Given recent hospitalizations for CAP treat for HCAP. -Most likely secondary to aspiration, secondary to medication overdose, and dysphagia -Cefepime and Vancomycin. Complete 5  day course antibiotics -D5-0.9% saline 125m/hr -DuoNeb QID -Flonase  -Flutter Valve -Sputum cultures pending -Blood cultures NGTD -Strep pneumo/influenza negative -With multiple episodes of PNA in the last 6 weeks. VQ scan pending,  -Lower extremity Doppler negative -VQ scan negative  Enteritis - may be contributing to principle problem - Continue patient on cipro and flagyl   ARF(Baseline1.07 on 1/14)  -Creatinine 2.40,  -Rehydrate and follow Lab Results  Component Value Date   CREATININE 1.09 (H) 04/03/2016   CREATININE 1.09 (H) 04/01/2016   CREATININE 1.08 (H) 03/31/2016  -within normal limits with hydration  Anemia -Hgb 7.8, 8.7 on 1/14 -Will follow  Recent Labs Lab 03/29/16 0218 03/31/16 0531 04/01/16 0432  HGB 8.5* 8.7* 10.4*   Hypothyroidism -Low TSH in December -Will recheck TSH and free T4 at this time  DM type 2 controlled with complication -AT6Y7.3 in May 2017, 6.1 in 12/17 -Will not recheck -Hold long-acting insulin for now, cover with SSI  Hypomagnesemia -reassess level next am.  Projectile Vomiting/Acute Abdominal pain/Diarrhea. -KUB reviewed. Will place order for CT scan -Stool culture pending, C. difficile by PCR is negative.  DVT prophylaxis: Lovenox Code Status: Full Family Communication: none at bedside. Disposition Plan: Resolution encephalopathy/pneumonia   Consultants:  [facility]. JBalinda Quailsat [facility] (not consulted)  Procedures/Significant Events:  2/8 DG chest:Low lung volumes with borderline cardiomegaly and interstitial edema. Subtle airspace opacities at the lung bases left greater than right cannot exclude superimposed pneumonia 2/9 CT abdomen pelvis WO contrast:-Negative for nephrolithiasis hydronephrosis or ureteral stone. - Mild density in the left > right posterior lower lobes could reflect pneumonia or possible aspiration. 2/10 VQ scan: Negative for PE   VENTILATOR  SETTINGS: None   Cultures 2/8 blood NGTD 2/8 urine Negative 2/9 sputum pending  Antimicrobials: Anti-infectives    Start     Stop   03/29/16 0000  ceFEPIme (MAXIPIME) 2 g in dextrose 5 % 50 mL IVPB     04/04/16 2359   03/28/16 2200  vancomycin (VANCOCIN) 1,750 mg in sodium chloride 0.9 % 500 mL IVPB         03/28/16 2000  ceFEPIme (MAXIPIME) 2 g in dextrose 5 % 50 mL IVPB  Status:  Discontinued     03/28/16 1945   03/28/16 0100  ceFEPIme (MAXIPIME) 2 g in dextrose 5 % 50 mL IVPB     03/28/16 0211   03/27/16 2130  vancomycin (VANCOCIN) 2,500 mg in sodium chloride 0.9 % 500 mL IVPB     03/28/16 0125   03/27/16 2115  vancomycin (VANCOCIN) 3,076 mg in sodium chloride 0.9 % 500 mL IVPB  Status:  Discontinued     03/27/16 2109   03/27/16 2115  piperacillin-tazobactam (ZOSYN) IVPB 3.375 g     03/28/16 0013       Devices    LINES / TUBES:   Continuous Infusions:    Objective: Vitals:   04/03/16 2118 04/04/16 0520 04/04/16 0700 04/04/16 1429  BP: (!) 167/84 (!) 162/74 (!) 143/63 (!) 154/87  Pulse: 84 84 71 90  Resp:  18 18   Temp: 98.7 F (37.1 C) 97.9 F (36.6 C) 97.4 F (36.3 C) 98.6 F (37 C)  TempSrc: Oral Oral Oral Oral  SpO2: 100% 99% 98%  100%  Weight:      Height:        Intake/Output Summary (Last 24 hours) at 04/04/16 1626 Last data filed at 04/04/16 1600  Gross per 24 hour  Intake              763 ml  Output             1100 ml  Net             -337 ml   Filed Weights   03/28/16 0626 03/31/16 0600 03/31/16 1629  Weight: (!) 158.8 kg (350 lb 1.6 oz) (!) 150.7 kg (332 lb 3.7 oz) (!) 151.8 kg (334 lb 10.5 oz)    Examination:  General: awake and alert, in NAD Eyes: negative scleral hemorrhage, negative anisocoria, negative icterus ENT: Negative Runny nose, negative gingival bleeding, Neck:  Negative scars, masses, torticollis, lymphadenopathy, JVD Lungs: equal chest rise, no audible wheezes Cardiovascular: Regular rate and rhythm without murmur  gallop or rub normal S1 and S2 Abdomen: MORBIDLY OBESE, positive abdominal pain worse in RLQ, distended, positive soft, bowel sounds, no rebound, no ascites, no appreciable mass Extremities: No significant cyanosis, clubbing, or edema bilateral lower extremities Skin: Negative rashes, lesions, ulcers Psychiatric:  Negative depression, negative anxiety, negative fatigue, negative mania  Central nervous system:  No facial asymmetry, sensation intact throughout,  negative dysarthria, negative expressive aphasia, negative receptive aphasia.  .  Data Reviewed: Care during the described time interval was provided by me .  I have reviewed this patient's available data, including medical history, events of note, physical examination, and all test results as part of my evaluation. I have personally reviewed and interpreted all radiology studies.  CBC:  Recent Labs Lab 03/29/16 0218 03/31/16 0531 04/01/16 0432  WBC 10.9* 11.3* 15.3*  HGB 8.5* 8.7* 10.4*  HCT 27.0* 27.8* 33.1*  MCV 83.1 83.5 82.8  PLT 224 217 443   Basic Metabolic Panel:  Recent Labs Lab 03/29/16 1531 03/30/16 1113 03/31/16 0531 04/01/16 0432 04/02/16 1639 04/03/16 0517  NA 143 145  --  145  --  141  K 5.0 4.0  --  3.6  --  4.2  CL 110 112*  --  113*  --  108  CO2 25 24  --  23  --  23  GLUCOSE 132* 205*  --  196*  --  118*  BUN 25* 23*  --  32*  --  26*  CREATININE 1.10* 1.12* 1.08* 1.09*  --  1.09*  CALCIUM 9.5 9.2  --  8.5*  --  8.4*  MG 1.7 1.6*  --  2.0 2.0  --    GFR: Estimated Creatinine Clearance: 83.3 mL/min (by C-G formula based on SCr of 1.09 mg/dL (H)). Liver Function Tests:  Recent Labs Lab 03/30/16 1113 04/01/16 0432 04/03/16 0517  AST _0 ALT _1 ALKPHOS 59 59 53  BILITOT 0.7 0.7 0.9  PROT 6.3* 6.1* 5.5*  ALBUMIN 3.2* 2.9* 3.0*   No results for input(s): LIPASE, AMYLASE in the last 168 hours. No results for input(s): AMMONIA in the last 168 hours. Coagulation Profile: No  results for input(s): INR, PROTIME in the last 168 hours. Cardiac Enzymes: No results for input(s): CKTOTAL, CKMB, CKMBINDEX, TROPONINI in the last 168 hours. BNP (last 3 results) No results for input(s): PROBNP in the last 8760 hours. HbA1C: No results for input(s): HGBA1C in the last 72 hours. CBG:  Recent Labs Lab 04/03/16 1325  04/03/16 1739 04/03/16 2120 04/04/16 0830 04/04/16 1211  GLUCAP 127* 120* 151* 136* 160*   Lipid Profile: No results for input(s): CHOL, HDL, LDLCALC, TRIG, CHOLHDL, LDLDIRECT in the last 72 hours. Thyroid Function Tests: No results for input(s): TSH, T4TOTAL, FREET4, T3FREE, THYROIDAB in the last 72 hours. Anemia Panel: No results for input(s): VITAMINB12, FOLATE, FERRITIN, TIBC, IRON, RETICCTPCT in the last 72 hours. Urine analysis:    Component Value Date/Time   COLORURINE YELLOW 03/27/2016 2020   APPEARANCEUR CLEAR 03/27/2016 2020   APPEARANCEUR Clear 11/13/2013 1310   LABSPEC 1.012 03/27/2016 2020   LABSPEC 1.017 11/13/2013 1310   PHURINE 5.0 03/27/2016 2020   GLUCOSEU NEGATIVE 03/27/2016 2020   GLUCOSEU Negative 11/13/2013 1310   GLUCOSEU NEG mg/dL 03/02/2006 2050   HGBUR NEGATIVE 03/27/2016 2020   BILIRUBINUR NEGATIVE 03/27/2016 2020   BILIRUBINUR Negative 11/13/2013 [place] 03/27/2016 2020   PROTEINUR NEGATIVE 03/27/2016 2020   UROBILINOGEN 0.2 11/12/2014 2057   NITRITE NEGATIVE 03/27/2016 2020   LEUKOCYTESUR NEGATIVE 03/27/2016 2020   LEUKOCYTESUR Trace 11/13/2013 1310   Sepsis Labs: _0 (procalcitonin:4,lacticidven:4)  ) Recent Results (from the past 240 hour(s))  Blood Culture (routine x 2)     Status: None   Collection Time: 03/27/16  6:12 PM  Result Value Ref Range Status   Specimen Description BLOOD RIGHT ANTECUBITAL  Final   Special Requests BOTTLES DRAWN AEROBIC ONLY 10CC  Final   Culture NO GROWTH 5 DAYS  Final   Report Status 04/01/2016 FINAL  Final  Blood Culture (routine x 2)     Status:  None   Collection Time: 03/27/16  6:50 PM  Result Value Ref Range Status   Specimen Description BLOOD RIGHT HAND  Final   Special Requests BOTTLES DRAWN AEROBIC AND ANAEROBIC 5CC  Final   Culture NO GROWTH 5 DAYS  Final   Report Status 04/01/2016 FINAL  Final  Urine culture     Status: None   Collection Time: 03/27/16  8:20 PM  Result Value Ref Range Status   Specimen Description URINE, CATHETERIZED  Final   Special Requests NONE  Final   Culture NO GROWTH  Final   Report Status 03/29/2016 FINAL  Final  MRSA PCR Screening     Status: None   Collection Time: 03/28/16  6:30 AM  Result Value Ref Range Status   MRSA by PCR NEGATIVE NEGATIVE Final    Comment:        The GeneXpert MRSA Assay (FDA approved for NASAL specimens only), is one component of a comprehensive MRSA colonization surveillance program. It is not intended to diagnose MRSA infection nor to guide or monitor treatment for MRSA infections.   Stool culture (children & immunocomp patients)     Status: None (Preliminary result)   Collection Time: 04/01/16 11:37 AM  Result Value Ref Range Status   Salmonella/Shigella Screen PENDING  Incomplete   Campylobacter Culture PENDING  Incomplete   E coli, Shiga toxin Assay Negative Negative Final    Comment: (NOTE) Performed At: [facility] [place], [place] [id number] Lindon Romp MD PV:[id number]   C difficile quick scan w PCR reflex     Status: None   Collection Time: 04/01/16 11:37 AM  Result Value Ref Range Status   C Diff antigen NEGATIVE NEGATIVE Final   C Diff toxin NEGATIVE NEGATIVE Final   C Diff interpretation No C. difficile detected.  Final     Radiology Studies: Ct Abdomen Pelvis W Contrast  Result Date: 04/02/2016 CLINICAL DATA:  Pneumonia, altered mental status.  Abdominal pain. EXAM: CT ABDOMEN AND PELVIS WITH CONTRAST TECHNIQUE: Multidetector CT imaging of the abdomen and pelvis was performed using the standard protocol  following bolus administration of intravenous contrast. CONTRAST:  100 cc Isovue 300 IV COMPARISON:  Same day KUB, CT from 03/28/2016 FINDINGS: Lower chest: Small pleural effusions bilaterally slightly increased since prior exam with adjacent atelectasis. Borderline cardiomegaly. No pericardial effusion. Hepatobiliary: Cholecystectomy. No focal liver lesion or biliary dilatation. Pancreas: Fatty atrophy of the pancreas without focal mass or ductal dilatation. No surrounding peripancreatic inflammation. Spleen: Normal size without focal abnormality. Adrenals/Urinary Tract: Normal bilateral adrenal glands and kidneys without obstructive uropathy. Physiologically distended appearance of the bladder. Stomach/Bowel: Contracted stomach. Normal small bowel rotation. Mild jejunal thickening without significant fluid-filled distention. Enteric contrast passes into large bowel. No evidence acute appendicitis. Vascular/Lymphatic: Aortic atherosclerosis. No enlarged abdominal or pelvic lymph nodes. Reproductive: Status post hysterectomy. No adnexal masses. Other: Small fat containing umbilical hernia. No free air nor free fluid. Left lower quadrant iatrogenic subcutaneous emphysema likely related injection subcutaneous medication. Musculoskeletal: Degenerate changes of the dorsal spine. No acute nor suspicious osseous lesions. IMPRESSION: 1. Moderate diffuse thickening of jejunum since prior exam suspicious for enteritis. No mechanical bowel obstruction or pneumatosis. 2. Slight increase in small bilateral pleural effusions. Bibasilar atelectasis without pneumonic consolidations. 3. Small fat containing umbilical hernia. Electronically Signed   By: Ashley Royalty M.D.   On: 04/02/2016 22:51    Scheduled Meds: . carvedilol  3.125 mg Oral BID WC  . chlorhexidine  15 mL Mouth Rinse BID  . ciprofloxacin  400 mg Intravenous Q12H  . docusate sodium  200 mg Oral QHS  . enoxaparin (LOVENOX) injection  80 mg Subcutaneous Q24H  .  furosemide  40 mg Oral Daily  . haloperidol lactate  1 mg Intravenous Q12H  . insulin aspart  0-20 Units Subcutaneous TID WC  . levothyroxine  150 mcg Oral QAC breakfast  . LORazepam  0.5 mg Oral QHS  . mouth rinse  15 mL Mouth Rinse BID  . metronidazole  500 mg Intravenous Q8H  . polyethylene glycol  17 g Oral Daily  . sodium chloride flush  3 mL Intravenous Q12H   Continuous Infusions:    LOS: 8 days    Time spent: 40 minutes   Velvet Bathe, MD [facility] Pager [phone]  If 7PM-7AM, please contact night-coverage [email or web address] Password [facility] 04/04/2016, 4:26 PM

## 2016-04-04 NOTE — Progress Notes (Signed)
Physical Therapy Treatment Patient Details Name: [name] MRN: [id number] DOB: Dec 27, 1955 Today's Date: 04/04/2016    History of Present Illness Pt admitted with AMS thought to be due to polypharmacy. PMH: recurrent PNA, chronic back pain, BPPV, morbid obesity,schizophrenia, secondary Parkinsonism.    PT Comments    Pt eager to get OOB today but unable to due to feet not being able to touch floor. Used maximove to tranfer pt OOB to chair where she performed LE strengthening exercises with minimal increased pain. Pt stated, "I like being out of the bed; can we get rid this bed?" Communicated to nursing patient no longer needs pressure relief bed. Plan to progress LE exercises and attempt longer standing endurance if pt allows.    Follow Up Recommendations  SNF     Equipment Recommendations  None recommended by PT    Recommendations for Other Services       Precautions / Restrictions Precautions Precautions: Fall Restrictions Weight Bearing Restrictions: No    Mobility  Bed Mobility Overal bed mobility: Needs Assistance Bed Mobility: Rolling Rolling: Min assist;+2 for physical assistance         General bed mobility comments: Min A +2 for rolling to change out chux pad and apply maximove sling. Cuing for initiation but able to assist with increased time. Required maximove for OOB due to pt inability to reach floor with feet.   Transfers Overall transfer level: Needs assistance Equipment used:  (Maximove for OOB, Stedy for sit to stands) Transfers: Sit to/from [employer] to Stand: Min assist;From elevated surface;Mod assist         General transfer comment: Pt willing to get OOB but required maximove to get OOB due to inability for feet to touch ground.  Pt stated she wanted to use commode in bathroom. Used stedy for sit<>stand x4 (2 from elevated surface with min A to boost, x2 from lower surface with mod A to boost)  Ambulation/Gait                  Stairs            Wheelchair Mobility    Modified Rankin (Stroke Patients Only)       Balance Overall balance assessment: Needs assistance Sitting-balance support: Bilateral upper extremity supported;Feet supported Sitting balance-Leahy Scale: Poor Sitting balance - Comments: Able to sit in stedy for 2-3 min with UE support   Standing balance support: Bilateral upper extremity supported Standing balance-Leahy Scale: Poor Standing balance comment: Pt standing in stedy with Bil UE support and min guard for safety                     Cognition Arousal/Alertness: Awake/alert Behavior During Therapy: WFL for tasks assessed/performed Overall Cognitive Status: Impaired/Different from baseline Area of Impairment: Problem solving;Following commands       Following Commands: Follows one step commands consistently     Problem Solving: Slow processing;Decreased initiation General Comments: able to follow commands with increased time for initiating    Exercises Total Joint Exercises Ankle Circles/Pumps: Both;10 reps;Seated Quad Sets: Both;10 reps;Seated Short Arc Quad: Both;10 reps;Seated Long Arc Quad: Both;10 reps;Seated General Exercises - Upper Extremity Shoulder Flexion: AROM;Strengthening;Both;10 reps;Supine Shoulder ABduction: Strengthening;Both;10 reps;Supine Elbow Flexion: Strengthening;Both;10 reps;Supine Elbow Extension: Strengthening;Both;10 reps;Supine    General Comments        Pertinent Vitals/Pain Pain Assessment: Faces Pain Score: 10-Worst pain ever Faces Pain Scale: Hurts even more Pain Location: back Pain Descriptors / Indicators: Moaning;Grimacing Pain Intervention(s):  Monitored during session;Repositioned    Home Living                      Prior Function            PT Goals (current goals can now be found in the care plan section) Acute Rehab PT Goals Patient Stated Goal: to get out of bed Potential to Achieve  Goals: Good Progress towards PT goals: Progressing toward goals    Frequency    Min 2X/week      PT Plan Current plan remains appropriate    Co-evaluation             End of Session Equipment Utilized During Treatment: Oxygen;Other (comment) (Maximove for OOB, Stedy for transfers) Activity Tolerance: Patient limited by pain;Patient limited by fatigue Patient left: with call bell/phone within reach;in chair     Time: 7989-2119 PT Time Calculation (min) (ACUTE ONLY): 47 min  Charges:  $Therapeutic Exercise: 8-22 mins $Therapeutic Activity: 23-37 mins                    G Codes:      [facility] 2016/04/22, 4:06 PM [place], [place] Pager [phone]

## 2016-04-04 NOTE — Progress Notes (Signed)
CSW provided list of bed offers to patient and to pt dtr.  No choice at this time  CSW will continue to follow  Jorge Ny, [place] Social Worker [phone]

## 2016-04-05 LAB — GLUCOSE, CAPILLARY
GLUCOSE-CAPILLARY: 135 mg/dL — AB (ref 65–99)
GLUCOSE-CAPILLARY: 149 mg/dL — AB (ref 65–99)
Glucose-Capillary: 144 mg/dL — ABNORMAL HIGH (ref 65–99)
Glucose-Capillary: 138 mg/dL — ABNORMAL HIGH (ref 65–99)

## 2016-04-05 NOTE — Progress Notes (Signed)
PROGRESS NOTE    [name]  GYY:[id number] DOB: [birth year] DOA: 03/27/2016 PCP: [name], MD   Brief Narrative:  61 y.o. BF PMHx Schizophrenia, Anxiety,Depression, CKD stage III, HTN, HLD, Obesity, DM type II uncontrolled with complication, OSA/OHS, morbid obesity, obstructive sleep apnea, diagnosed 2008 Lt Breast cancer Ductal Carcinoma in Situ, S/P lumpectomy + XRT, HSV (herpes simplex virus) infection,Chronic lower back pain  She was recently hospitalized here for community-acquired pneumonia presenting with AMS.  She is unable to answer any questions and is unaccompanied.  HPI for Dr. Tyrone Nine:  [name] with a chief complaint of altered mental status. Family said that they've been acting more confused today. Having some mild cough and side pain. Patient is currently more alert than she had been earlier today. States that she has no issues with her side currently but has been coughing. Having some diffuse myalgias worse to the legs. Family denies any recent medication changes. Has been having multiple falls over the past couple days.  She was previously hospitalized: -1/12-15/18 for sepsis due to HCAP, acute hypoxic respiratory failure, slurred speech, and  AKI on CKD. -12/27-31/17 for sepsis due to multifocal PNA with mild AKI, metabolic encephalopathy; acute on chronic diastolic CHF exacerbation  ED Course: Per Dr. Tyrone Nine: [name] with multiple falls over past couple of days. Patient also has had some transient confusion. Was just in the hospital with hospital acquired pneumonia. Will repeat a chest x-ray obtain a UA. Check basic labs. CT the head with multiple falls recently as well. With generalized weakness will check a troponin. The patient had a reported blood pressure in the field and in the 70s. Here her blood pressure has been in the 207O systolic. Will give a liter of fluids. Send off for cultures.  Patient continues to have a soft blood pressure. Lactate very  minimally elevated. Chest x-ray concerning for possible pneumonia. Patient also has an acute kidney injury. Will give more fluids. Broadly cover with antibiotics as patient was just in the hospital. Discussed with hospitalist  Subjective: Pt more alert. Was inquiring about SNF. Also discussed with daughter.  Assessment & Plan:   Principal Problem:   Encephalopathy Active Problems:   Hypothyroidism   Anemia   Diabetes mellitus, type II, insulin dependent ([place])   HCAP (healthcare-associated pneumonia)   Acute renal failure (ARF) ([place])   Disorganized schizophrenia ([place])   Chronic pain syndrome   Acute renal failure ([place])   Controlled diabetes mellitus type 2 with complications ([facility])   Acute encephalopathy   Oropharyngeal dysphagia   Aspiration pneumonia of right lower lobe due to vomit ([place])   Altered mental status   Schizophrenia ([place])   Schizophrenia, paranoid ([place])   Aspiration pneumonia of left lower lobe due to gastric secretions ([facility])   Projectile vomiting with nausea   Acute Encephalopathy -Multifactorial (has HCAP, see below); PE; CVA; overdose; renal failure; metabolic encephalopathy. -CT head negative -May be secondary to infectious etiology. Improving on antibiotics and limiting polypharmacy  Schizophrenia -See chronic pain and dysphagia   Chronic pain syndrome  -in order to avoid withdrawal will need to continue some patient's home medications at a lower dose  A. Ativan 1 mg QHS  B. Morphine 1 mg  q 6 hr PRN  C. Haldol IV 2 mg BID  D. hold Lyrica, Zanaflex, Geodon until patent passes swallow study.  Dysphagia -2/9 patient miserably failed bedside swallow. On 2/12 FEES passed. But secondary to projectile vomiting patient made  nothing by mouth again .   HCAP/Aspiration Pneumonia/PE? -Given recent hospitalizations for CAP treat for HCAP. -Most likely secondary to aspiration, secondary to medication overdose, and dysphagia -Cefepime and Vancomycin. Complete 5  day course antibiotics -D5-0.9% saline 156m/hr -DuoNeb QID -Flonase  -Flutter Valve -Sputum cultures pending -Blood cultures NGTD -Strep pneumo/influenza negative -With multiple episodes of PNA in the last 6 weeks. VQ scan pending,  -Lower extremity Doppler negative -VQ scan negative  Enteritis - may be contributing to principle problem - Continue patient on cipro and flagyl   ARF(Baseline1.07 on 1/14)  -Creatinine 2.40,  -Rehydrate and follow Lab Results  Component Value Date   CREATININE 1.09 (H) 04/03/2016   CREATININE 1.09 (H) 04/01/2016   CREATININE 1.08 (H) 03/31/2016  -within normal limits with hydration  Anemia -Hgb 7.8, 8.7 on 1/14 -Will follow  Recent Labs Lab 03/31/16 0531 04/01/16 0432  HGB 8.7* 10.4*   Hypothyroidism -Low TSH in December -Will recheck TSH and free T4 at this time  DM type 2 controlled with complication -AV0D7.3 in May 2017, 6.1 in 12/17 -Will not recheck -Hold long-acting insulin for now, cover with SSI  Hypomagnesemia -reassess level next am.  Projectile Vomiting/Acute Abdominal pain/Diarrhea. -KUB reviewed. Will place order for CT scan -Stool culture pending, C. difficile by PCR is negative.  DVT prophylaxis: Lovenox Code Status: Full Family Communication: none at bedside. Disposition Plan: We'll consult social worker for discharge planning. Patient to transition to SNF as she lives at home would not be safe to transition home   Consultants:  [facility]  Procedures/Significant Events:  2/8 DG chest:Low lung volumes with borderline cardiomegaly and interstitial edema. Subtle airspace opacities at the lung bases left greater than right cannot exclude superimposed pneumonia 2/9 CT abdomen pelvis WO contrast:-Negative for nephrolithiasis hydronephrosis or ureteral stone. - Mild density in the left > right posterior lower lobes could reflect pneumonia or possible  aspiration. 2/10 VQ scan: Negative for PE   VENTILATOR SETTINGS: None   Cultures 2/8 blood NGTD 2/8 urine Negative 2/9 sputum pending  Antimicrobials: Anti-infectives    Start     Stop   03/29/16 0000  ceFEPIme (MAXIPIME) 2 g in dextrose 5 % 50 mL IVPB     04/04/16 2359   03/28/16 2200  vancomycin (VANCOCIN) 1,750 mg in sodium chloride 0.9 % 500 mL IVPB         03/28/16 2000  ceFEPIme (MAXIPIME) 2 g in dextrose 5 % 50 mL IVPB  Status:  Discontinued     03/28/16 1945   03/28/16 0100  ceFEPIme (MAXIPIME) 2 g in dextrose 5 % 50 mL IVPB     03/28/16 0211   03/27/16 2130  vancomycin (VANCOCIN) 2,500 mg in sodium chloride 0.9 % 500 mL IVPB     03/28/16 0125   03/27/16 2115  vancomycin (VANCOCIN) 3,076 mg in sodium chloride 0.9 % 500 mL IVPB  Status:  Discontinued     03/27/16 2109   03/27/16 2115  piperacillin-tazobactam (ZOSYN) IVPB 3.375 g     03/28/16 0013       Devices    LINES / TUBES:   Continuous Infusions:    Objective: Vitals:   04/04/16 1429 04/04/16 1748 04/04/16 2108 04/05/16 0501  BP: (!) 154/87 (!) 153/72 (!) 151/96 132/67  Pulse: 90  88 84  Resp:   (!) 22 (!) 22  Temp: 98.6 F (37 C)  98.2 F (36.8 C) 98.1 F (36.7 C)  TempSrc: Oral  Oral Oral  SpO2: 100%  99% 100%  Weight:      Height:        Intake/Output Summary (Last 24 hours) at 04/05/16 1455 Last data filed at 04/05/16 1331  Gross per 24 hour  Intake              460 ml  Output              800 ml  Net             -340 ml   Filed Weights   03/28/16 0626 03/31/16 0600 03/31/16 1629  Weight: (!) 158.8 kg (350 lb 1.6 oz) (!) 150.7 kg (332 lb 3.7 oz) (!) 151.8 kg (334 lb 10.5 oz)    Examination:  General: awake and alert, in NAD Eyes: negative scleral hemorrhage, negative anisocoria, negative icterus ENT: Negative Runny nose, negative gingival bleeding, Neck:  Negative scars, masses, torticollis, lymphadenopathy, JVD Lungs: equal chest rise, no audible wheezes Cardiovascular:  Regular rate and rhythm without murmur gallop or rub normal S1 and S2 Abdomen: MORBIDLY OBESE, positive abdominal pain worse in RLQ, distended, positive soft, bowel sounds, no rebound, no ascites, no appreciable mass Extremities: No significant cyanosis, clubbing, or edema bilateral lower extremities Skin: Negative rashes, lesions, ulcers Psychiatric:  Negative depression, negative anxiety, negative fatigue, negative mania  Central nervous system:  No facial asymmetry, sensation intact throughout,  negative dysarthria, negative expressive aphasia, negative receptive aphasia.  .  Data Reviewed: Care during the described time interval was provided by me .  I have reviewed this patient's available data, including medical history, events of note, physical examination, and all test results as part of my evaluation. I have personally reviewed and interpreted all radiology studies.  CBC:  Recent Labs Lab 03/31/16 0531 04/01/16 0432  WBC 11.3* 15.3*  HGB 8.7* 10.4*  HCT 27.8* 33.1*  MCV 83.5 82.8  PLT 217 314   Basic Metabolic Panel:  Recent Labs Lab 03/29/16 1531 03/30/16 1113 03/31/16 0531 04/01/16 0432 04/02/16 1639 04/03/16 0517  NA 143 145  --  145  --  141  K 5.0 4.0  --  3.6  --  4.2  CL 110 112*  --  113*  --  108  CO2 25 24  --  23  --  23  GLUCOSE 132* 205*  --  196*  --  118*  BUN 25* 23*  --  32*  --  26*  CREATININE 1.10* 1.12* 1.08* 1.09*  --  1.09*  CALCIUM 9.5 9.2  --  8.5*  --  8.4*  MG 1.7 1.6*  --  2.0 2.0  --    GFR: Estimated Creatinine Clearance: 83.3 mL/min (by C-G formula based on SCr of 1.09 mg/dL (H)). Liver Function Tests:  Recent Labs Lab 03/30/16 1113 04/01/16 0432 04/03/16 0517  AST _0 ALT _1 ALKPHOS 59 59 53  BILITOT 0.7 0.7 0.9  PROT 6.3* 6.1* 5.5*  ALBUMIN 3.2* 2.9* 3.0*   No results for input(s): LIPASE, AMYLASE in the last 168 hours. No results for input(s): AMMONIA in the last 168 hours. Coagulation Profile: No  results for input(s): INR, PROTIME in the last 168 hours. Cardiac Enzymes: No results for input(s): CKTOTAL, CKMB, CKMBINDEX, TROPONINI in the last 168 hours. BNP (last 3 results) No results for input(s): PROBNP in the last 8760 hours. HbA1C: No results for input(s): HGBA1C in the last 72 hours. CBG:  Recent  Labs Lab 04/04/16 1211 04/04/16 1709 04/04/16 2112 04/05/16 0753 04/05/16 1228  GLUCAP 160* 123* 151* 144* 135*   Lipid Profile: No results for input(s): CHOL, HDL, LDLCALC, TRIG, CHOLHDL, LDLDIRECT in the last 72 hours. Thyroid Function Tests: No results for input(s): TSH, T4TOTAL, FREET4, T3FREE, THYROIDAB in the last 72 hours. Anemia Panel: No results for input(s): VITAMINB12, FOLATE, FERRITIN, TIBC, IRON, RETICCTPCT in the last 72 hours. Urine analysis:    Component Value Date/Time   COLORURINE YELLOW 03/27/2016 2020   APPEARANCEUR CLEAR 03/27/2016 2020   APPEARANCEUR Clear 11/13/2013 1310   LABSPEC 1.012 03/27/2016 2020   LABSPEC 1.017 11/13/2013 1310   PHURINE 5.0 03/27/2016 2020   GLUCOSEU NEGATIVE 03/27/2016 2020   GLUCOSEU Negative 11/13/2013 1310   GLUCOSEU NEG mg/dL 03/02/2006 2050   HGBUR NEGATIVE 03/27/2016 2020   BILIRUBINUR NEGATIVE 03/27/2016 2020   BILIRUBINUR Negative 11/13/2013 [place] 03/27/2016 2020   PROTEINUR NEGATIVE 03/27/2016 2020   UROBILINOGEN 0.2 11/12/2014 2057   NITRITE NEGATIVE 03/27/2016 2020   LEUKOCYTESUR NEGATIVE 03/27/2016 2020   LEUKOCYTESUR Trace 11/13/2013 1310   Sepsis Labs: _0 (procalcitonin:4,lacticidven:4)  ) Recent Results (from the past 240 hour(s))  Blood Culture (routine x 2)     Status: None   Collection Time: 03/27/16  6:12 PM  Result Value Ref Range Status   Specimen Description BLOOD RIGHT ANTECUBITAL  Final   Special Requests BOTTLES DRAWN AEROBIC ONLY 10CC  Final   Culture NO GROWTH 5 DAYS  Final   Report Status 04/01/2016 FINAL  Final  Blood Culture (routine x 2)     Status:  None   Collection Time: 03/27/16  6:50 PM  Result Value Ref Range Status   Specimen Description BLOOD RIGHT HAND  Final   Special Requests BOTTLES DRAWN AEROBIC AND ANAEROBIC 5CC  Final   Culture NO GROWTH 5 DAYS  Final   Report Status 04/01/2016 FINAL  Final  Urine culture     Status: None   Collection Time: 03/27/16  8:20 PM  Result Value Ref Range Status   Specimen Description URINE, CATHETERIZED  Final   Special Requests NONE  Final   Culture NO GROWTH  Final   Report Status 03/29/2016 FINAL  Final  MRSA PCR Screening     Status: None   Collection Time: 03/28/16  6:30 AM  Result Value Ref Range Status   MRSA by PCR NEGATIVE NEGATIVE Final    Comment:        The GeneXpert MRSA Assay (FDA approved for NASAL specimens only), is one component of a comprehensive MRSA colonization surveillance program. It is not intended to diagnose MRSA infection nor to guide or monitor treatment for MRSA infections.   Stool culture (children & immunocomp patients)     Status: None (Preliminary result)   Collection Time: 04/01/16 11:37 AM  Result Value Ref Range Status   Salmonella/Shigella Screen Final report  Final    Comment: (NOTE) Performed At: [facility] [place], [place] [id number] Lindon Romp MD EQ:[id number]    Campylobacter Culture PENDING  Incomplete   E coli, Shiga toxin Assay Negative Negative Final    Comment: (NOTE) Performed At: [facility] [place], [place] [id number] Lindon Romp MD ER:[id number]   C difficile quick scan w PCR reflex     Status: None   Collection Time: 04/01/16 11:37 AM  Result Value Ref Range Status   C Diff antigen NEGATIVE NEGATIVE Final   C  Diff toxin NEGATIVE NEGATIVE Final   C Diff interpretation No C. difficile detected.  Final  STOOL CULTURE REFLEX - RSASHR     Status: None   Collection Time: 04/01/16 11:37 AM  Result Value Ref Range Status   Stool Culture result 1 (RSASHR) Comment   Final    Comment: (NOTE) No Salmonella or Shigella recovered. Performed At: [facility] [address] [place], [place] [id number] Lindon Romp MD [id number]      Radiology Studies: No results found.  Scheduled Meds: . carvedilol  3.125 mg Oral BID WC  . chlorhexidine  15 mL Mouth Rinse BID  . ciprofloxacin  500 mg Oral BID  . docusate sodium  200 mg Oral QHS  . enoxaparin (LOVENOX) injection  80 mg Subcutaneous Q24H  . furosemide  40 mg Oral Daily  . insulin aspart  0-20 Units Subcutaneous TID WC  . levothyroxine  150 mcg Oral QAC breakfast  . LORazepam  0.5 mg Oral QHS  . mouth rinse  15 mL Mouth Rinse BID  . metroNIDAZOLE  500 mg Oral Q8H  . polyethylene glycol  17 g Oral Daily  . sodium chloride flush  3 mL Intravenous Q12H   Continuous Infusions:    LOS: 9 days    Time spent: 40 minutes   Velvet Bathe, MD [facility] Pager [phone]  If 7PM-7AM, please contact night-coverage [email or web address] Password [facility] 04/05/2016, 2:55 PM

## 2016-04-05 NOTE — Progress Notes (Signed)
Haldol order discontinued per verbal order from MD since pt is not taking medication at home

## 2016-04-06 LAB — GLUCOSE, CAPILLARY
Glucose-Capillary: 167 mg/dL — ABNORMAL HIGH (ref 65–99)
Glucose-Capillary: 129 mg/dL — ABNORMAL HIGH (ref 65–99)
Glucose-Capillary: 144 mg/dL — ABNORMAL HIGH (ref 65–99)
Glucose-Capillary: 129 mg/dL — ABNORMAL HIGH (ref 65–99)

## 2016-04-06 MED ORDER — TRAMADOL HCL 50 MG PO TABS
50.0000 mg | ORAL_TABLET | Freq: Four times a day (QID) | ORAL | Status: DC | PRN
  Administered 2016-04-06 – 2016-04-07 (×2): 50 mg via ORAL
  Filled 2016-04-06 (×2): qty 1

## 2016-04-06 NOTE — Progress Notes (Signed)
Pts son reported he would try to bring cpap machine from home for pt to use while in hospital

## 2016-04-06 NOTE — Progress Notes (Signed)
Administered patient's medication in applesauce. Tolerated well.

## 2016-04-06 NOTE — Progress Notes (Signed)
Clinical Social Worker met with patient at bedside to offer support and discuss discharge needs. Patient stated that she has spoken with her family and they have decided on [facility] SNF. CSW spoke with patients son via phone and he agreed to placement.  CSW remains available for any discharge needs.  Rhea Pink, MSW,  [place]

## 2016-04-06 NOTE — Progress Notes (Signed)
Called to room by Rapid Response RN for ABG due to patient somnolent. Pt arousable but will not stay awake. Upon attempting abg, pt jerked and screaming. MD then came in room and since patient more awake stated to hold off on ABG and hold off on Step down.

## 2016-04-06 NOTE — Progress Notes (Signed)
Patient had drool on her mouth. I asked her if she drools when she is asleep.  She shook her head yes. Will monitor.

## 2016-04-06 NOTE — Progress Notes (Signed)
PROGRESS NOTE    [name]  [name]:[id number] DOB: Jul 02, 1955 DOA: 03/27/2016 PCP: Tammie Greenland, MD   Brief Narrative:  61 y.o. BF PMHx Schizophrenia, Anxiety,Depression, CKD stage III, HTN, HLD, Obesity, DM type II uncontrolled with complication, OSA/OHS, morbid obesity, obstructive sleep apnea, diagnosed 2008 Lt Breast cancer Ductal Carcinoma in Situ, S/P lumpectomy + XRT, HSV (herpes simplex virus) infection,Chronic lower back pain  She was recently hospitalized here for community-acquired pneumonia presenting with AMS.  She is unable to answer any questions and is unaccompanied.  HPI for Dr. Tyrone Perez:  [name] with a chief complaint of altered mental status. Family said that they've been acting more confused today. Having some mild cough and side pain. Patient is currently more alert than she had been earlier today. States that she has no issues with her side currently but has been coughing. Having some diffuse myalgias worse to the legs. Family denies any recent medication changes. Has been having multiple falls over the past couple days.  She was previously hospitalized: -1/12-15/18 for sepsis due to HCAP, acute hypoxic respiratory failure, slurred speech, and  AKI on CKD. -12/27-31/17 for sepsis due to multifocal PNA with mild AKI, metabolic encephalopathy; acute on chronic diastolic CHF exacerbation  ED Course: Per Dr. Tyrone Perez: [name] with multiple falls over past couple of days. Patient also has had some transient confusion. Was just in the hospital with hospital acquired pneumonia. Will repeat a chest x-ray obtain a UA. Check basic labs. CT the head with multiple falls recently as well. With generalized weakness will check a troponin. The patient had a reported blood pressure in the field and in the 70s. Here her blood pressure has been in the 924M systolic. Will give a liter of fluids. Send off for cultures.  Patient continues to have a soft blood pressure. Lactate very  minimally elevated. Chest x-ray concerning for possible pneumonia. Patient also has an acute kidney injury. Will give more fluids. Broadly cover with antibiotics as patient was just in the hospital. Discussed with hospitalist  Subjective: Pt awake and alert.   Assessment & Plan:   Principal Problem:   Encephalopathy Active Problems:   Hypothyroidism   Anemia   Diabetes mellitus, type II, insulin dependent ([name])   HCAP (healthcare-associated pneumonia)   Acute renal failure (ARF) ([name])   Disorganized schizophrenia ([name])   Chronic pain syndrome   Acute renal failure ([name])   Controlled diabetes mellitus type 2 with complications ([name])   Acute encephalopathy   Oropharyngeal dysphagia   Aspiration pneumonia of right lower lobe due to vomit ([name])   Altered mental status   Schizophrenia ([name])   Schizophrenia, paranoid ([name])   Aspiration pneumonia of left lower lobe due to gastric secretions ([name])   Projectile vomiting with nausea   Acute Encephalopathy -Multifactorial (has HCAP, see below); PE; CVA; overdose; renal failure; metabolic encephalopathy. -CT head negative -May be secondary to infectious etiology. Improving on antibiotics and limiting polypharmacy  Schizophrenia -See chronic pain and dysphagia   Chronic pain syndrome  -in order to avoid withdrawal will need to continue some patient's home medications at a lower dose  A. Ativan 1 mg QHS  B. Morphine 1 mg  q 6 hr PRN  C. Haldol IV 2 mg BID  D. hold Lyrica, Zanaflex, Geodon until patent passes swallow study.  Given hypersomnolence will d/c ativan and d/c morphine. Place on tramadol prn  Dysphagia -2/9 patient miserably failed bedside swallow. On 2/12 FEES passed.  But secondary to projectile vomiting patient made nothing by mouth again .   HCAP/Aspiration Pneumonia/PE? -Given recent hospitalizations for CAP treat for HCAP. -Most likely secondary to aspiration, secondary to medication overdose, and  dysphagia -Cefepime and Vancomycin. Complete 5 day course antibiotics -D5-0.9% saline 163m/hr -DuoNeb QID -Flonase  -Flutter Valve -Sputum cultures pending -Blood cultures NGTD -Strep pneumo/influenza negative -With multiple episodes of PNA in the last 6 weeks. VQ scan pending,  -Lower extremity Doppler negative -VQ scan negative  Enteritis - may be contributing to principle problem - Continue patient on cipro and flagyl   ARF(Baseline1.07 on 1/14)  -Creatinine 2.40,  -Rehydrate and follow Lab Results  Component Value Date   CREATININE 1.09 (H) 04/03/2016   CREATININE 1.09 (H) 04/01/2016   CREATININE 1.08 (H) 03/31/2016  -within normal limits with hydration  Anemia -Hgb 7.8, 8.7 on 1/14 -Will follow  Recent Labs Lab 03/31/16 0531 04/01/16 0432  HGB 8.7* 10.4*   Hypothyroidism -Low TSH in December -Will recheck TSH and free T4 at this time  DM type 2 controlled with complication -AF7X7.3 in May 2017, 6.1 in 12/17 -Will not recheck -Hold long-acting insulin for now, cover with SSI  Hypomagnesemia -reassess level next am.  Projectile Vomiting/Acute Abdominal pain/Diarrhea. -KUB reviewed. Will place order for CT scan -Stool culture pending, C. difficile by PCR is negative.  DVT prophylaxis: Lovenox Code Status: Full Family Communication: none at bedside. Disposition Plan: We'll consult social worker for discharge planning. Patient to transition to SNF as she lives at home would not be safe to transition home   Cons[facility]ants:  [facility]  Procedures/Significant Events:  2/8 DG chest:Low lung volumes with borderline cardiomegaly and interstitial edema. Subtle airspace opacities at the lung bases left greater than right cannot exclude superimposed pneumonia 2/9 CT abdomen pelvis WO contrast:-Negative for nephrolithiasis hydronephrosis or ureteral stone. - Mild density in the left > right posterior lower  lobes could reflect pneumonia or possible aspiration. 2/10 VQ scan: Negative for PE   VENTILATOR SETTINGS: None   Cultures 2/8 blood NGTD 2/8 urine Negative 2/9 sputum pending  Antimicrobials: Anti-infectives    Start     Stop   03/29/16 0000  ceFEPIme (MAXIPIME) 2 g in dextrose 5 % 50 mL IVPB     04/04/16 2359   03/28/16 2200  vancomycin (VANCOCIN) 1,750 mg in sodium chloride 0.9 % 500 mL IVPB         03/28/16 2000  ceFEPIme (MAXIPIME) 2 g in dextrose 5 % 50 mL IVPB  Status:  Discontinued     03/28/16 1945   03/28/16 0100  ceFEPIme (MAXIPIME) 2 g in dextrose 5 % 50 mL IVPB     03/28/16 0211   03/27/16 2130  vancomycin (VANCOCIN) 2,500 mg in sodium chloride 0.9 % 500 mL IVPB     03/28/16 0125   03/27/16 2115  vancomycin (VANCOCIN) 3,076 mg in sodium chloride 0.9 % 500 mL IVPB  Status:  Discontinued     03/27/16 2109   03/27/16 2115  piperacillin-tazobactam (ZOSYN) IVPB 3.375 g     03/28/16 0013       Devices    LINES / TUBES:   Continuous Infusions:    Objective: Vitals:   04/05/16 1558 04/05/16 2303 04/06/16 0501 04/06/16 0834  BP: (!) 158/79 (!) 143/75 133/65 122/88  Pulse: 99 94 89 (!) 105  Resp: (!) _0 Temp: 98.2 F (36.8 C) 99 F (37.2  C) 99.2 F (37.3 C)   TempSrc: Oral Oral    SpO2: 99% 97% 92%   Weight:      Height:        Intake/Output Summary (Last 24 hours) at 04/06/16 1335 Last data filed at 04/06/16 1133  Gross per 24 hour  Intake              480 ml  Output             1550 ml  Net            -1070 ml   Filed Weights   03/28/16 0626 03/31/16 0600 03/31/16 1629  Weight: (!) 158.8 kg (350 lb 1.6 oz) (!) 150.7 kg (332 lb 3.7 oz) (!) 151.8 kg (334 lb 10.5 oz)    Examination:  General: awake and alert, in NAD Eyes: negative scleral hemorrhage, negative anisocoria, negative icterus ENT: Negative Runny nose, negative gingival bleeding, Neck:  Negative scars, masses, torticollis, lymphadenopathy, JVD Lungs: equal chest rise,  no audible wheezes Cardiovascular: Regular rate and rhythm without murmur gallop or rub normal S1 and S2 Abdomen: MORBIDLY OBESE, positive abdominal pain worse in RLQ, distended, positive soft, bowel sounds, no rebound, no ascites, no appreciable mass Extremities: No significant cyanosis, clubbing, or edema bilateral lower extremities Skin: Negative rashes, lesions, ulcers Psychiatric:  Negative depression, negative anxiety, negative fatigue, negative mania  Central nervous system:  No facial asymmetry, sensation intact throughout,  negative dysarthria, negative expressive aphasia, negative receptive aphasia., oriented x 3 to person place and time.  .  Data Reviewed: Care during the described time interval was provided by me .  I have reviewed this patient's available data, including medical history, events of note, physical examination, and all test results as part of my evaluation. I have personally reviewed and interpreted all radiology studies.  CBC:  Recent Labs Lab 03/31/16 0531 04/01/16 0432  WBC 11.3* 15.3*  HGB 8.7* 10.4*  HCT 27.8* 33.1*  MCV 83.5 82.8  PLT 217 818   Basic Metabolic Panel:  Recent Labs Lab 03/31/16 0531 04/01/16 0432 04/02/16 1639 04/03/16 0517  NA  --  145  --  141  K  --  3.6  --  4.2  CL  --  113*  --  108  CO2  --  23  --  23  GLUCOSE  --  196*  --  118*  BUN  --  32*  --  26*  CREATININE 1.08* 1.09*  --  1.09*  CALCIUM  --  8.5*  --  8.4*  MG  --  2.0 2.0  --    GFR: Estimated Creatinine Clearance: 83.3 mL/min (by C-G formula based on SCr of 1.09 mg/dL (H)). Liver Function Tests:  Recent Labs Lab 04/01/16 0432 04/03/16 0517  AST 23 19  ALT 26 24  ALKPHOS 59 53  BILITOT 0.7 0.9  PROT 6.1* 5.5*  ALBUMIN 2.9* 3.0*   No results for input(s): LIPASE, AMYLASE in the last 168 hours. No results for input(s): AMMONIA in the last 168 hours. Coagulation Profile: No results for input(s): INR, PROTIME in the last 168 hours. Cardiac  Enzymes: No results for input(s): CKTOTAL, CKMB, CKMBINDEX, TROPONINI in the last 168 hours. BNP (last 3 results) No results for input(s): PROBNP in the last 8760 hours. HbA1C: No results for input(s): HGBA1C in the last 72 hours. CBG:  Recent Labs Lab 04/05/16 1228 04/05/16 1730 04/05/16 2301 04/06/16 0834 04/06/16 1249  GLUCAP 135* 138* 149*  167* 129*   Lipid Profile: No results for input(s): CHOL, HDL, LDLCALC, TRIG, CHOLHDL, LDLDIRECT in the last 72 hours. Thyroid Function Tests: No results for input(s): TSH, T4TOTAL, FREET4, T3FREE, THYROIDAB in the last 72 hours. Anemia Panel: No results for input(s): VITAMINB12, FOLATE, FERRITIN, TIBC, IRON, RETICCTPCT in the last 72 hours. Urine analysis:    Component Value Date/Time   COLORURINE YELLOW 03/27/2016 2020   APPEARANCEUR CLEAR 03/27/2016 2020   APPEARANCEUR Clear 11/13/2013 1310   LABSPEC 1.012 03/27/2016 2020   LABSPEC 1.017 11/13/2013 1310   PHURINE 5.0 03/27/2016 2020   GLUCOSEU NEGATIVE 03/27/2016 2020   GLUCOSEU Negative 11/13/2013 1310   GLUCOSEU NEG mg/dL 03/02/2006 2050   HGBUR NEGATIVE 03/27/2016 2020   BILIRUBINUR NEGATIVE 03/27/2016 2020   BILIRUBINUR Negative 11/13/2013 [place] 03/27/2016 2020   PROTEINUR NEGATIVE 03/27/2016 2020   UROBILINOGEN 0.2 11/12/2014 2057   NITRITE NEGATIVE 03/27/2016 2020   LEUKOCYTESUR NEGATIVE 03/27/2016 2020   LEUKOCYTESUR Trace 11/13/2013 1310   Sepsis Labs: _0 (procalcitonin:4,lacticidven:4)  ) Recent Results (from the past 240 hour(s))  Blood Culture (routine x 2)     Status: None   Collection Time: 03/27/16  6:12 PM  Result Value Ref Range Status   Specimen Description BLOOD RIGHT ANTECUBITAL  Final   Special Requests BOTTLES DRAWN AEROBIC ONLY 10CC  Final   Culture NO GROWTH 5 DAYS  Final   Report Status 04/01/2016 FINAL  Final  Blood Culture (routine x 2)     Status: None   Collection Time: 03/27/16  6:50 PM  Result Value Ref Range  Status   Specimen Description BLOOD RIGHT HAND  Final   Special Requests BOTTLES DRAWN AEROBIC AND ANAEROBIC 5CC  Final   Culture NO GROWTH 5 DAYS  Final   Report Status 04/01/2016 FINAL  Final  Urine culture     Status: None   Collection Time: 03/27/16  8:20 PM  Result Value Ref Range Status   Specimen Description URINE, CATHETERIZED  Final   Special Requests NONE  Final   Culture NO GROWTH  Final   Report Status 03/29/2016 FINAL  Final  MRSA PCR Screening     Status: None   Collection Time: 03/28/16  6:30 AM  Result Value Ref Range Status   MRSA by PCR NEGATIVE NEGATIVE Final    Comment:        The GeneXpert MRSA Assay (FDA approved for NASAL specimens only), is one component of a comprehensive MRSA colonization surveillance program. It is not intended to diagnose MRSA infection nor to guide or monitor treatment for MRSA infections.   Stool culture (children & immunocomp patients)     Status: None (Preliminary result)   Collection Time: 04/01/16 11:37 AM  Result Value Ref Range Status   Salmonella/Shigella Screen Final report  Final    Comment: (NOTE) Performed At: [facility] [place], [place] [id number] Lindon Romp MD LR:[id number]    Campylobacter Culture PENDING  Incomplete   E coli, Shiga toxin Assay Negative Negative Final    Comment: (NOTE) Performed At: [facility] [place], [place] [id number] Lindon Romp MD UP:[id number]   C difficile quick scan w PCR reflex     Status: None   Collection Time: 04/01/16 11:37 AM  Result Value Ref Range Status   C Diff antigen NEGATIVE NEGATIVE Final   C Diff toxin NEGATIVE NEGATIVE Final   C Diff interpretation No C. difficile detected.  Final  STOOL CULTURE REFLEX - RSASHR     Status: None   Collection Time: 04/01/16 11:37 AM  Result Value Ref Range Status   Stool Culture result 1 (RSASHR) Comment  Final    Comment: (NOTE) No Salmonella or Shigella  recovered. Performed At: [facility] [address] [place], [place] [id number] Lindon Romp MD TP:[id number]      Radiology Studies: No results found.  Scheduled Meds: . carvedilol  3.125 mg Oral BID WC  . chlorhexidine  15 mL Mouth Rinse BID  . ciprofloxacin  500 mg Oral BID  . docusate sodium  200 mg Oral QHS  . enoxaparin (LOVENOX) injection  80 mg Subcutaneous Q24H  . furosemide  40 mg Oral Daily  . insulin aspart  0-20 Units Subcutaneous TID WC  . levothyroxine  150 mcg Oral QAC breakfast  . mouth rinse  15 mL Mouth Rinse BID  . metroNIDAZOLE  500 mg Oral Q8H  . polyethylene glycol  17 g Oral Daily  . sodium chloride flush  3 mL Intravenous Q12H   Continuous Infusions:    LOS: 10 days    Time spent: 35 minutes   Velvet Bathe, MD [facility] Pager [phone]  If 7PM-7AM, please contact night-coverage [email or web address] Password [email or web address] 04/06/2016, 1:35 PM

## 2016-04-06 NOTE — Significant Event (Signed)
Rapid Response Event Note  Overview:  Called for increased lethargy Time Called: 0848 Arrival Time: 0852 Event Type: Respiratory  Initial Focused Assessment:  Called for increased lethary.  On my arrival to patients room, RN at bedside.  Patient sitting in chair very lethargic, arouses to loud stimuli, but drifts back to sleep very quickly.  On RA 86%, placed on nasal cannula 3 lpm.    Interventions:   RT called for ABG,  ABG unsuccessful, patient now awake and communicating, alert and oreinted.  Dr. Wendee Beavers at bedside.  Plan of Care (if not transferred):  RN to monitor and call if assistance needed  Event Summary:   at      at          [facility]

## 2016-04-06 NOTE — Progress Notes (Signed)
Attempted to administer morning medications when noted pt was more lethargic than normal. Pt continued to fall asleep while attempting to take meds. Pt reported prior to incident that she was on cpap at home but has not been on cpap since at hospital. Rapid contacted to assess pt. Wendee Beavers was also notified. New orders to put pt on bipap and ABG. Respiratory attempted ABG and pt became more alert. MD in room with pt and reported to continue to monitor pt for the day. Ativan order was discontinued as MD attributed pts current state to ativan dose.

## 2016-04-07 DIAGNOSIS — J690 Pneumonitis due to inhalation of food and vomit: Secondary | ICD-10-CM | POA: Diagnosis not present

## 2016-04-07 DIAGNOSIS — R531 Weakness: Secondary | ICD-10-CM | POA: Diagnosis not present

## 2016-04-07 DIAGNOSIS — K529 Noninfective gastroenteritis and colitis, unspecified: Secondary | ICD-10-CM | POA: Diagnosis not present

## 2016-04-07 DIAGNOSIS — F200 Paranoid schizophrenia: Secondary | ICD-10-CM | POA: Diagnosis not present

## 2016-04-07 DIAGNOSIS — R1312 Dysphagia, oropharyngeal phase: Secondary | ICD-10-CM | POA: Diagnosis not present

## 2016-04-07 DIAGNOSIS — M545 Low back pain: Secondary | ICD-10-CM | POA: Diagnosis not present

## 2016-04-07 DIAGNOSIS — R2689 Other abnormalities of gait and mobility: Secondary | ICD-10-CM | POA: Diagnosis not present

## 2016-04-07 DIAGNOSIS — R2681 Unsteadiness on feet: Secondary | ICD-10-CM | POA: Diagnosis not present

## 2016-04-07 DIAGNOSIS — G894 Chronic pain syndrome: Secondary | ICD-10-CM | POA: Diagnosis not present

## 2016-04-07 DIAGNOSIS — J189 Pneumonia, unspecified organism: Secondary | ICD-10-CM | POA: Diagnosis not present

## 2016-04-07 DIAGNOSIS — G9340 Encephalopathy, unspecified: Secondary | ICD-10-CM | POA: Diagnosis not present

## 2016-04-07 DIAGNOSIS — M6281 Muscle weakness (generalized): Secondary | ICD-10-CM | POA: Diagnosis not present

## 2016-04-07 DIAGNOSIS — R5381 Other malaise: Secondary | ICD-10-CM | POA: Diagnosis not present

## 2016-04-07 DIAGNOSIS — M25512 Pain in left shoulder: Secondary | ICD-10-CM | POA: Diagnosis not present

## 2016-04-07 DIAGNOSIS — M25561 Pain in right knee: Secondary | ICD-10-CM | POA: Diagnosis not present

## 2016-04-07 DIAGNOSIS — G8929 Other chronic pain: Secondary | ICD-10-CM | POA: Diagnosis not present

## 2016-04-07 LAB — GLUCOSE, CAPILLARY
Glucose-Capillary: 161 mg/dL — ABNORMAL HIGH (ref 65–99)
Glucose-Capillary: 181 mg/dL — ABNORMAL HIGH (ref 65–99)

## 2016-04-07 MED ORDER — ENOXAPARIN SODIUM 80 MG/0.8ML ~~LOC~~ SOLN
0.5000 mg/kg | SUBCUTANEOUS | Status: DC
  Administered 2016-04-07: 70 mg via SUBCUTANEOUS
  Filled 2016-04-07: qty 0.8

## 2016-04-07 MED ORDER — METRONIDAZOLE 500 MG PO TABS: 500.0000 mg | ORAL_TABLET | Freq: Three times a day (TID) | ORAL | 0 refills | Status: AC

## 2016-04-07 MED ORDER — CIPROFLOXACIN HCL 500 MG PO TABS: 500.0000 mg | ORAL_TABLET | Freq: Two times a day (BID) | ORAL | 0 refills | Status: AC

## 2016-04-07 NOTE — Progress Notes (Signed)
Returned patients medication from pharmacy.

## 2016-04-07 NOTE — Progress Notes (Signed)
[name] to be D/C'd Skilled nursing facility per MD order.  Discussed with the patient and all questions fully answered.  VSS, Skin clean, dry and intact without evidence of skin break down, no evidence of skin tears noted. IV catheter discontinued intact. Site without signs and symptoms of complications. Dressing and pressure applied.  An After Visit Summary was printed and given to the patient. Patient received prescription.  D/c education completed with patient/family including follow up instructions, medication list, d/c activities limitations if indicated, with other d/c instructions as indicated by MD - patient able to verbalize understanding, all questions fully answered.   Patient instructed to return to ED, call 911, or call MD for any changes in condition.   Patient escorted via WC, and D/C to SNF via ambulance.Dorris Carnes 04/07/2016 3:06 PM

## 2016-04-07 NOTE — Progress Notes (Signed)
Speech Language Pathology Treatment: Dysphagia  Patient Details Name: [name] MRN: [id number] DOB: 02/17/1956 Today's Date: 04/07/2016 Time: 0233-4356 SLP Time Calculation (min) (ACUTE ONLY): 14 min  Assessment / Plan / Recommendation Clinical Impression  Pt eating breakfast in suboptimal position when SLP arrived with RN present; repositioned to a safer, more upright posture. Min lingual residue with pancake. Decreased mandibular excursion during mastication with extended transit time. Disliked breakfast (taste), stopping after only several bites. Pt agreeable to chocolate pudding (total amount consumed ?). No indications of airway compromise. She denies difficulty masticating food she receives. Continue Dys 2, thin liquids.    HPI HPI: 61 year old female admitted 03/27/16 due to AMS, confusion. PMH significant for GERD, HLD, secondary Parksinsonism, scizophrenia, CKD3, HTN, OSA/obseity (339#), recurrent PNA, recurrent falls. CXR reveals subtle airspace opacities BLL L>R, PNA cannot be excluded.       SLP Plan  Continue with current plan of care     Recommendations  Diet recommendations: Dysphagia 2 (fine chop);Thin liquid Liquids provided via: Cup;Straw Medication Administration: Whole meds with puree Supervision: Patient able to self feed;Intermittent supervision to cue for compensatory strategies Compensations: Slow rate;Small sips/bites;Lingual sweep for clearance of pocketing Postural Changes and/or Swallow Maneuvers: Seated upright 90 degrees                Oral Care Recommendations: Oral care BID Follow up Recommendations: Skilled Nursing facility Plan: Continue with current plan of care       [name], [name] 04/07/2016, 9:23 AM  Orbie Pyo Colvin Caroli.Ed [employer] [phone]

## 2016-04-07 NOTE — Progress Notes (Addendum)
Patient will discharge to [facility] Anticipated discharge date: 2/19 Family notified: pt son Cabin crew by Corey Harold- scheduled for 2:45pm Report #: [phone]  [place] signing off.  Jorge Ny, LCSW Clinical Social Worker [phone]

## 2016-04-07 NOTE — Discharge Summary (Signed)
Physician Discharge Summary  [name] CHY:[id number] DOB: [birth year] DOA: 03/27/2016  PCP: [name], MD  Admit date: 03/27/2016 Discharge date: 04/07/2016  Time spent: > 35 minutes  Recommendations for Outpatient Follow-up:  1. Pt had AMS secondary to infection and polypharmacy. Please decide which medications to continue with improvement in patient's condition.   Discharge Diagnoses:  Principal Problem:   Encephalopathy Active Problems:   Hypothyroidism   Anemia   Diabetes mellitus, type II, insulin dependent ([place])   HCAP (healthcare-associated pneumonia)   Acute renal failure (ARF) ([place])   Disorganized schizophrenia ([place])   Chronic pain syndrome   Acute renal failure ([place])   Controlled diabetes mellitus type 2 with complications (HCC)   Acute encephalopathy   Oropharyngeal dysphagia   Aspiration pneumonia of right lower lobe due to vomit ([place])   Altered mental status   Schizophrenia ([place])   Schizophrenia, paranoid ([place])   Aspiration pneumonia of left lower lobe due to gastric secretions (HCC)   Projectile vomiting with nausea   Discharge Condition: stable  Diet recommendation: dysphagia 2 diet  Filed Weights   03/31/16 0600 03/31/16 1629 04/07/16 0501  Weight: (!) 150.7 kg (332 lb 3.7 oz) (!) 151.8 kg (334 lb 10.5 oz) (!) 144.4 kg (318 lb 5.5 oz)    History of present illness:  61 y.o.BF PMHx Schizophrenia, Anxiety,Depression, CKD stage III, HTN, HLD, Obesity, DM type II uncontrolled with complication, OSA/OHS, morbid obesity, obstructive sleep apnea, diagnosed 2008 Lt Breast cancer Ductal Carcinoma in Situ, S/P lumpectomy + XRT, HSV (herpes simplex virus) infection,Chronic lower back pain.  Pt diagnosed with AMS secondary to polypharmacy, pna, and colitis  Hospital Course:   Acute Encephalopathy - Multifactorial (has HCAP, see below); PE; CVA; overdose; renal failure; metabolic encephalopathy. - CT head negative - May be secondary to infectious  etiology. Improving on antibiotics and limiting polypharmacy   Schizophrenia -See chronic pain and dysphagia   Chronic pain syndrome  -in order to avoid withdrawal will need to hold some patient's home medication             A. Ativan 1 mg QHS             B. Morphine 1 mg  q 6 hr PRN             C. Haldol IV 2 mg BID             D. hold Lyrica, Zanaflex, Geodon   Dysphagia -2/9 patient miserably failed bedside swallow. On 2/12 FEES passed. But secondary to projectile vomiting patient made nothing by mouth again .     HCAP/Aspiration Pneumonia/PE? -Given recent hospitalizations for CAP treat for HCAP. -Most likely secondary to aspiration, secondary to medication overdose, and dysphagia -Cefepime and Vancomycin. Complete 5 day course antibiotics -Blood cultures NGTD -Strep pneumo/influenza negative -With multiple episodes of PNA in the last 6 weeks.  -Lower extremity Doppler negative -VQ scan negative  Enteritis - may be contributing to principle problem - Continue patient on cipro and flagyl on d/c to complete a 10 day treatment course.   ARF(Baseline1.07 on 1/14)  -Creatinine 2.40,  -Rehydrate and follow Recent Labs       Lab Results  Component Value Date   CREATININE 1.09 (H) 04/03/2016   CREATININE 1.09 (H) 04/01/2016   CREATININE 1.08 (H) 03/31/2016    -within normal limits with hydration  Anemia -Hgb 7.8, 8.7 on 1/14 -Will follow  Last Labs    Recent Labs Lab 03/31/16 0531  04/01/16 0432  HGB 8.7* 10.4*     Hypothyroidism -Low TSH in December -Will recheck TSH and free T4 at this time  DM type 2 controlled with complication -I9J 7.3 in May 2017, 6.1 in 12/17 -Will not recheck -Hold long-acting insulin for now, cover with SSI  Hypomagnesemia -reassess level next am.  Projectile Vomiting/Acute Abdominal pain/Diarrhea. -KUB reviewed. Will place order for CT scan -Stool culture pending, C. difficile by PCR is  negative.   Procedures:  None  Consultations:  None  Discharge Exam: Vitals:   04/07/16 0501 04/07/16 0624  BP: (!) 166/127 (!) 147/78  Pulse: 97 92  Resp: 18   Temp: 98.2 F (36.8 C)     General: pt in nad, alert and awake Cardiovascular: rrr, no rubs Respiratory: no increased wob, no wheezes  Discharge Instructions   Discharge Instructions    Call MD for:  severe uncontrolled pain    Complete by:  As directed    Call MD for:  temperature >100.4    Complete by:  As directed    Diet - low sodium heart healthy    Complete by:  As directed    Discharge instructions    Complete by:  As directed    Please follow-up with your primary care physician at SNF. I believe the patient saw hypersomnolence is secondary to polypharmacy as such I have cut down on a lot of medications which can cause somnolence. Please decide which to continue as patient conditions and mental status returns back to baseline.   Increase activity slowly    Complete by:  As directed      Current Discharge Medication List    START taking these medications   Details  ciprofloxacin (CIPRO) 500 MG tablet Take 1 tablet (500 mg total) by mouth 2 (two) times daily. Qty: 12 tablet, Refills: 0    metroNIDAZOLE (FLAGYL) 500 MG tablet Take 1 tablet (500 mg total) by mouth every 8 (eight) hours. Qty: 21 tablet, Refills: 0      CONTINUE these medications which have NOT CHANGED   Details  albuterol (PROAIR HFA) 108 (90 Base) MCG/ACT inhaler Inhale 2 puffs into the lungs See admin instructions. Every 4-6 hours as needed for wheezing or shortness of breath    carvedilol (COREG) 3.125 MG tablet Take 1 tablet (3.125 mg total) by mouth 2 (two) times daily with a meal. Qty: 60 tablet, Refills: 0    docusate sodium (COLACE) 100 MG capsule Take 200 mg by mouth at bedtime.    esomeprazole (NEXIUM) 40 MG capsule Take 1 capsule (40 mg total) by mouth daily before breakfast. Qty: 30 capsule, Refills: 1    Insulin  Detemir (LEVEMIR FLEXTOUCH) 100 UNIT/ML Pen Inject 10 Units into the skin at bedtime.    Multiple Vitamins-Minerals (ONE-A-DAY WOMENS 50+ ADVANTAGE) TABS Take 1 tablet by mouth daily.    rosuvastatin (CRESTOR) 20 MG tablet Take 20 mg by mouth daily.    sitaGLIPtin-metformin (JANUMET) 50-500 MG per tablet Take 1 tablet by mouth 2 (two) times daily with a meal.    venlafaxine XR (EFFEXOR-XR) 150 MG 24 hr capsule Take 150 mg by mouth 2 (two) times daily.    albuterol (PROVENTIL) (2.5 MG/3ML) 0.083% nebulizer solution Inhale 3 mLs into the lungs every 6 (six) hours as needed. For shortness of breath    aspirin EC 81 MG tablet Take 1 tablet (81 mg total) by mouth daily. Qty: 30 tablet, Refills: 0    B Complex-C (B-COMPLEX WITH  VITAMIN C) tablet Take 1 tablet by mouth daily.    Cholecalciferol (VITAMIN D-3) 1000 units CAPS Take 1,000 Units by mouth daily.    ferrous sulfate 325 (65 FE) MG EC tablet Take 1 tablet (325 mg total) by mouth daily with breakfast. Qty: 30 tablet, Refills: 0    furosemide (LASIX) 40 MG tablet Take 1 tablet (40 mg total) by mouth daily. Qty: 30 tablet, Refills: 0    levothyroxine (SYNTHROID, LEVOTHROID) 150 MCG tablet Take 1 tablet (150 mcg total) by mouth daily before breakfast. Qty: 60 tablet, Refills: 0    polyethylene glycol (MIRALAX / GLYCOLAX) packet Take 17 g by mouth daily. Qty: 14 each, Refills: 0    valACYclovir (VALTREX) 1000 MG tablet Take 1,000 mg by mouth 2 (two) times daily.       STOP taking these medications     doxepin (SINEQUAN) 25 MG capsule      gabapentin (NEURONTIN) 100 MG capsule      insulin lispro (HUMALOG KWIKPEN) 100 UNIT/ML KiwkPen      lidocaine (XYLOCAINE) 2 % solution      linaclotide (LINZESS) 290 MCG CAPS capsule      LORazepam (ATIVAN) 2 MG tablet      pregabalin (LYRICA) 150 MG capsule      valsartan (DIOVAN) 40 MG tablet      valsartan-hydrochlorothiazide (DIOVAN-HCT) 160-12.5 MG tablet      ziprasidone  (GEODON) 40 MG capsule      guaiFENesin (MUCINEX) 600 MG 12 hr tablet      oxyCODONE (ROXICODONE) 15 MG immediate release tablet      senna-docusate (SENOKOT-S) 8.6-50 MG tablet      Tapentadol HCl (NUCYNTA ER) 250 MG TB12      tiZANidine (ZANAFLEX) 2 MG tablet        Allergies  Allergen Reactions  . Bee Venom Anaphylaxis  . Shellfish Allergy Anaphylaxis  . Buprenorphine Hcl Other (See Comments)    Overly sedated      The results of significant diagnostics from this hospitalization (including imaging, microbiology, ancillary and laboratory) are listed below for reference.    Significant Diagnostic Studies: Ct Abdomen Pelvis Wo Contrast  Result Date: 03/28/2016 CLINICAL DATA:  Right flank pain EXAM: CT ABDOMEN AND PELVIS WITHOUT CONTRAST TECHNIQUE: Multidetector CT imaging of the abdomen and pelvis was performed following the standard protocol without IV contrast. COMPARISON:  06/23/2015 FINDINGS: Lower chest: Mild ground-glass densities in the left greater than right posterior lower lobes. No effusion. The heart appears borderline enlarged. Hepatobiliary: No focal liver abnormality is seen. Status post cholecystectomy. No biliary dilatation. Pancreas: Unremarkable. No pancreatic ductal dilatation or surrounding inflammatory changes. Spleen: Normal in size without focal abnormality. Adrenals/Urinary Tract: Adrenal glands are unremarkable. Kidneys are normal, without renal calculi, focal lesion, or hydronephrosis. Bladder is unremarkable. Nonspecific perinephric fat stranding. Stomach/Bowel: Stomach is within normal limits. Appendix contains a few stones but is otherwise normal. No evidence of bowel wall thickening, distention, or inflammatory changes. Large amount of stool in the right and transverse colon. Vascular/Lymphatic: Aortic atherosclerosis. No enlarged abdominal or pelvic lymph nodes. Reproductive: Status post hysterectomy. No adnexal masses. Other: Small fat containing umbilical  hernia. No free air or free fluid. Musculoskeletal: Degenerative changes. No acute or suspicious bone lesions. IMPRESSION: 1. Negative for nephrolithiasis hydronephrosis or ureteral stone. 2. Mild density in the left greater than right posterior lower lobes could reflect pneumonia or possible aspiration. Electronically Signed   By: Donavan Foil M.D.   On: 03/28/2016 01:13  Dg Chest 2 View  Result Date: 03/29/2016 CLINICAL DATA:  Difficulty breathing, hypoxia EXAM: CHEST  2 VIEW COMPARISON:  03/27/2016 FINDINGS: Increased interstitial markings. Mild patchy lingular/left lower lobe opacity, pneumonia not excluded. No pleural effusion or pneumothorax. The heart is top-normal in size. Surgical clips overlying the right neck. IMPRESSION: Mild patchy lingular/left lower lobe opacity, pneumonia not excluded. Increased interstitial markings without frank interstitial edema. Electronically Signed   By: Julian Hy M.D.   On: 03/29/2016 17:54   Dg Chest 2 View  Result Date: 03/27/2016 CLINICAL DATA:  Sepsis EXAM: CHEST  2 VIEW COMPARISON:  02/29/2016 FINDINGS: Low lung volumes. Borderline cardiomegaly. No aortic aneurysm. Mild bilateral interstitial edema. Possible superimposed pneumonia at each lung base left greater than right given slightly more confluent airspace opacities noted. Surgical clips are seen at the base the neck as before. No sizable effusion. No pneumothorax identified. No acute osseous abnormality. Small calcification about the right shoulder may reflect calcific tendinopathy. IMPRESSION: Low lung volumes with borderline cardiomegaly and interstitial edema. Subtle airspace opacities at the lung bases left greater than right cannot exclude superimposed pneumonia. Electronically Signed   By: Ashley Royalty M.D.   On: 03/27/2016 18:11   Dg Abd 1 View  Result Date: 04/02/2016 CLINICAL DATA:  Nausea and vomiting starting yesterday EXAM: ABDOMEN - 1 VIEW COMPARISON:  04/01/2016 FINDINGS: Mild  gaseous distended small bowel loops are noted in left mid abdomen suspicious for ileus, enteritis or early bowel obstruction. Some colonic gas noted in right colon and transverse colon. Colonic gas noted within rectum. IMPRESSION: Mild gaseous distended small bowel loops in left mid abdomen suspicious for mild ileus enteritis or less likely small bowel obstruction. Electronically Signed   By: Lahoma Crocker M.D.   On: 04/02/2016 10:36   Ct Head Wo Contrast  Result Date: 04/01/2016 CLINICAL DATA:  Acute onset of altered mental status. Initial encounter. EXAM: CT HEAD WITHOUT CONTRAST TECHNIQUE: Contiguous axial images were obtained from the base of the skull through the vertex without intravenous contrast. COMPARISON:  CT of the head performed 03/27/2016, and MRI of the brain performed 02/29/2016 FINDINGS: Brain: No evidence of acute infarction, hemorrhage, hydrocephalus, extra-axial collection or mass lesion/mass effect. Prominence of the ventricles and sulci reflects mild cortical volume loss. Mild cerebellar atrophy is noted. The brainstem and fourth ventricle are within normal limits. The basal ganglia are unremarkable in appearance. The cerebral hemispheres demonstrate grossly normal gray-white differentiation. No mass effect or midline shift is seen. Vascular: No hyperdense vessel or unexpected calcification. Skull: There is no evidence of fracture; visualized osseous structures are unremarkable in appearance. A mildly prominent empty sella is noted. Sinuses/Orbits: The orbits are within normal limits. The paranasal sinuses and mastoid air cells are well-aerated. Other: No significant soft tissue abnormalities are seen. Prominent cerumen is seen at the external auditory canals bilaterally, larger on the right. IMPRESSION: 1. No acute intracranial pathology seen on CT. 2. Mild cortical volume loss noted. 3. Prominent cerumen at the external auditory canals bilaterally. Electronically Signed   By: Garald Balding  M.D.   On: 04/01/2016 21:53   Ct Head Wo Contrast  Result Date: 03/27/2016 CLINICAL DATA:  Multiple falls. EXAM: CT HEAD WITHOUT CONTRAST TECHNIQUE: Contiguous axial images were obtained from the base of the skull through the vertex without intravenous contrast. COMPARISON:  02/29/2016 FINDINGS: Brain: There is no evidence for acute hemorrhage, hydrocephalus, mass lesion, or abnormal extra-axial fluid collection. No definite CT evidence for acute infarction. Vascular: No hyperdense  vessel or unexpected calcification. Skull: Normal. Negative for fracture or focal lesion. Sinuses/Orbits: Visualized portions of the paranasal sinuses are clear. Right mastoid air cells are clear. Fluid is visible in the left mastoid air cells. Visualized portions of the globes and intraorbital fat are unremarkable. Other: None. IMPRESSION: 1. No acute intracranial abnormality. 2. Left mastoid effusion. Electronically Signed   By: Misty Stanley M.D.   On: 03/27/2016 17:50   Ct Abdomen Pelvis W Contrast  Result Date: 04/02/2016 CLINICAL DATA:  Pneumonia, altered mental status.  Abdominal pain. EXAM: CT ABDOMEN AND PELVIS WITH CONTRAST TECHNIQUE: Multidetector CT imaging of the abdomen and pelvis was performed using the standard protocol following bolus administration of intravenous contrast. CONTRAST:  100 cc Isovue 300 IV COMPARISON:  Same day KUB, CT from 03/28/2016 FINDINGS: Lower chest: Small pleural effusions bilaterally slightly increased since prior exam with adjacent atelectasis. Borderline cardiomegaly. No pericardial effusion. Hepatobiliary: Cholecystectomy. No focal liver lesion or biliary dilatation. Pancreas: Fatty atrophy of the pancreas without focal mass or ductal dilatation. No surrounding peripancreatic inflammation. Spleen: Normal size without focal abnormality. Adrenals/Urinary Tract: Normal bilateral adrenal glands and kidneys without obstructive uropathy. Physiologically distended appearance of the bladder.  Stomach/Bowel: Contracted stomach. Normal small bowel rotation. Mild jejunal thickening without significant fluid-filled distention. Enteric contrast passes into large bowel. No evidence acute appendicitis. Vascular/Lymphatic: Aortic atherosclerosis. No enlarged abdominal or pelvic lymph nodes. Reproductive: Status post hysterectomy. No adnexal masses. Other: Small fat containing umbilical hernia. No free air nor free fluid. Left lower quadrant iatrogenic subcutaneous emphysema likely related injection subcutaneous medication. Musculoskeletal: Degenerate changes of the dorsal spine. No acute nor suspicious osseous lesions. IMPRESSION: 1. Moderate diffuse thickening of jejunum since prior exam suspicious for enteritis. No mechanical bowel obstruction or pneumatosis. 2. Slight increase in small bilateral pleural effusions. Bibasilar atelectasis without pneumonic consolidations. 3. Small fat containing umbilical hernia. Electronically Signed   By: Ashley Royalty M.D.   On: 04/02/2016 22:51   Nm Pulmonary Perf And Vent  Result Date: 03/29/2016 CLINICAL DATA:  Acute respiratory failure, hypoxia, cough EXAM: NUCLEAR MEDICINE VENTILATION - PERFUSION LUNG SCAN TECHNIQUE: Ventilation images were obtained in multiple projections using inhaled aerosol Tc-39mDTPA. Perfusion images were obtained in multiple projections after intravenous injection of Tc-962mAA. RADIOPHARMACEUTICALS:  32.5 mCi Technetium-9925mPA aerosol inhalation and 4.32 mCi Technetium-46m2m IV COMPARISON:  None. FINDINGS: Ventilation: Mildly heterogeneous.  No focal ventilation defect. Perfusion: No wedge shaped peripheral perfusion defects to suggest acute pulmonary embolism. IMPRESSION: Negative for pulmonary embolism. Electronically Signed   By: SriyJulian Hy.   On: 03/29/2016 17:50   Dg Abd Portable 1v  Result Date: 04/01/2016 CLINICAL DATA:  Vomiting episodes.  Abdominal pain . EXAM: PORTABLE ABDOMEN - 1 VIEW COMPARISON:  CT 03/28/2016.  FINDINGS: Soft tissue structures are unremarkable. Surgical clips right upper quadrant. No bowel distention. No free air. Calcifications in pelvis consistent phleboliths. Degenerative changes lumbar spine and both hips. IMPRESSION: No acute or focal abnormality. Electronically Signed   By: ThomMarcello Mooresgister   On: 04/01/2016 12:18    Microbiology: Recent Results (from the past 240 hour(s))  Stool culture (children & immunocomp patients)     Status: None (Preliminary result)   Collection Time: 04/01/16 11:37 AM  Result Value Ref Range Status   Salmonella/Shigella Screen Final report  Final    Comment: (NOTE) Performed At: [facility] [id number] RompPh:8KF:[id number] Culture PENDING  Incomplete  E coli, Shiga toxin Assay Negative Negative Final    Comment: (NOTE) Performed At: [facility] [place], [place] [id number] Lindon Romp MD [id number]   C difficile quick scan w PCR reflex     Status: None   Collection Time: 04/01/16 11:37 AM  Result Value Ref Range Status   C Diff antigen NEGATIVE NEGATIVE Final   C Diff toxin NEGATIVE NEGATIVE Final   C Diff interpretation No C. difficile detected.  Final  STOOL CULTURE REFLEX - RSASHR     Status: None   Collection Time: 04/01/16 11:37 AM  Result Value Ref Range Status   Stool Culture result 1 (RSASHR) Comment  Final    Comment: (NOTE) No Salmonella or Shigella recovered. Performed At: [facility] [place], [place] [id number] Lindon Romp MD JQ:[id number]      Labs: Basic Metabolic Panel:  Recent Labs Lab 04/01/16 0432 04/02/16 1639 04/03/16 0517  NA 145  --  141  K 3.6  --  4.2  CL 113*  --  108  CO2 23  --  23  GLUCOSE 196*  --  118*  BUN 32*  --  26*  CREATININE 1.09*  --  1.09*  CALCIUM 8.5*  --  8.4*  MG 2.0 2.0  --    Liver Function Tests:  Recent Labs Lab 04/01/16 0432 04/03/16 0517   AST 23 19  ALT 26 24  ALKPHOS 59 53  BILITOT 0.7 0.9  PROT 6.1* 5.5*  ALBUMIN 2.9* 3.0*   No results for input(s): LIPASE, AMYLASE in the last 168 hours. No results for input(s): AMMONIA in the last 168 hours. CBC:  Recent Labs Lab 04/01/16 0432  WBC 15.3*  HGB 10.4*  HCT 33.1*  MCV 82.8  PLT 229   Cardiac Enzymes: No results for input(s): CKTOTAL, CKMB, CKMBINDEX, TROPONINI in the last 168 hours. BNP: BNP (last 3 results)  Recent Labs  06/23/15 1333 02/14/16 0531  BNP 162.2* 36.1    ProBNP (last 3 results) No results for input(s): PROBNP in the last 8760 hours.  CBG:  Recent Labs Lab 04/06/16 0834 04/06/16 1249 04/06/16 1745 04/06/16 2315 04/07/16 0812  GLUCAP 167* 129* 144* 129* 161*    Signed:  Velvet Bathe MD.  [facility] 04/07/2016, 11:56 AM

## 2016-04-07 NOTE — Care Management Important Message (Signed)
Important Message  Patient Details  Name: [name] MRN: [id number] Date of Birth: 20-Jul-1955   Medicare Important Message Given:  Yes    Nathen May 04/07/2016, 12:24 PM

## 2016-04-07 NOTE — Clinical Social Work Placement (Signed)
   CLINICAL SOCIAL WORK PLACEMENT  NOTE  Date:  04/07/2016  Patient Details  Name: JESUSA STENERSON MRN: [id number] Date of Birth: September 22, 1955  Clinical Social Work is seeking post-discharge placement for this patient at the [place] level of care (*CSW will initial, date and re-position this form in  chart as items are completed):  Yes   Patient/family provided with [facility] Work Department's list of facilities offering this level of care within the geographic area requested by the patient (or if unable, by the patient's family).  Yes   Patient/family informed of their freedom to choose among providers that offer the needed level of care, that participate in Medicare, Medicaid or managed care program needed by the patient, have an available bed and are willing to accept the patient.  Yes   Patient/family informed of [facility]'s ownership interest in [facility] and [facility], as well as of the fact that they are under no obligation to receive care at these facilities.  PASRR submitted to EDS on       PASRR number received on       Existing PASRR number confirmed on 04/02/16     FL2 transmitted to all facilities in geographic area requested by pt/family on 04/02/16     FL2 transmitted to all facilities within larger geographic area on       Patient informed that his/her managed care company has contracts with or will negotiate with certain facilities, including the following:        Yes   Patient/family informed of bed offers received.  Patient chooses bed at [facility]     Physician recommends and patient chooses bed at      Patient to be transferred to [facility] on 04/07/16.  Patient to be transferred to facility by [facility]     Patient family notified on 04/07/16 of transfer.  Name of family member notified:  Darnell     PHYSICIAN Please sign FL2     Additional Comment:     _______________________________________________ Jorge Ny, LCSW 04/07/2016, 1:45 PM

## 2016-04-08 ENCOUNTER — Non-Acute Institutional Stay (SKILLED_NURSING_FACILITY): Payer: Medicare Other | Admitting: Adult Health

## 2016-04-08 ENCOUNTER — Encounter: Payer: Self-pay | Admitting: Adult Health

## 2016-04-08 DIAGNOSIS — R5381 Other malaise: Secondary | ICD-10-CM

## 2016-04-08 DIAGNOSIS — K529 Noninfective gastroenteritis and colitis, unspecified: Secondary | ICD-10-CM

## 2016-04-08 DIAGNOSIS — F200 Paranoid schizophrenia: Secondary | ICD-10-CM

## 2016-04-08 DIAGNOSIS — R1312 Dysphagia, oropharyngeal phase: Secondary | ICD-10-CM

## 2016-04-08 DIAGNOSIS — M25512 Pain in left shoulder: Secondary | ICD-10-CM | POA: Diagnosis not present

## 2016-04-08 DIAGNOSIS — R2681 Unsteadiness on feet: Secondary | ICD-10-CM | POA: Diagnosis not present

## 2016-04-08 DIAGNOSIS — R600 Localized edema: Secondary | ICD-10-CM

## 2016-04-08 DIAGNOSIS — D509 Iron deficiency anemia, unspecified: Secondary | ICD-10-CM

## 2016-04-08 DIAGNOSIS — I10 Essential (primary) hypertension: Secondary | ICD-10-CM

## 2016-04-08 DIAGNOSIS — E785 Hyperlipidemia, unspecified: Secondary | ICD-10-CM

## 2016-04-08 DIAGNOSIS — J189 Pneumonia, unspecified organism: Secondary | ICD-10-CM

## 2016-04-08 DIAGNOSIS — F329 Major depressive disorder, single episode, unspecified: Secondary | ICD-10-CM

## 2016-04-08 DIAGNOSIS — G9340 Encephalopathy, unspecified: Secondary | ICD-10-CM | POA: Diagnosis not present

## 2016-04-08 DIAGNOSIS — N179 Acute kidney failure, unspecified: Secondary | ICD-10-CM

## 2016-04-08 DIAGNOSIS — K219 Gastro-esophageal reflux disease without esophagitis: Secondary | ICD-10-CM

## 2016-04-08 DIAGNOSIS — M545 Low back pain: Secondary | ICD-10-CM | POA: Diagnosis not present

## 2016-04-08 DIAGNOSIS — F32A Depression, unspecified: Secondary | ICD-10-CM

## 2016-04-08 DIAGNOSIS — G894 Chronic pain syndrome: Secondary | ICD-10-CM

## 2016-04-08 DIAGNOSIS — E038 Other specified hypothyroidism: Secondary | ICD-10-CM

## 2016-04-08 DIAGNOSIS — E118 Type 2 diabetes mellitus with unspecified complications: Secondary | ICD-10-CM

## 2016-04-08 DIAGNOSIS — M25561 Pain in right knee: Secondary | ICD-10-CM | POA: Diagnosis not present

## 2016-04-08 DIAGNOSIS — G8929 Other chronic pain: Secondary | ICD-10-CM | POA: Diagnosis not present

## 2016-04-08 DIAGNOSIS — Z794 Long term (current) use of insulin: Secondary | ICD-10-CM

## 2016-04-08 LAB — STOOL CULTURE: E COLI SHIGA TOXIN ASSAY: NEGATIVE

## 2016-04-08 LAB — STOOL CULTURE REFLEX - CMPCXR

## 2016-04-08 LAB — STOOL CULTURE REFLEX - RSASHR

## 2016-04-08 NOTE — Progress Notes (Signed)
DATE:  04/08/2016   MRN:  [id number]  BIRTHDAY: [birth year]  Facility:  [facility] Home Location:  [place] Room Number: 5883-G  LEVEL OF CARE:  SNF (31)      Contact Information    Name Relation Home Work [place] Son   [phone]   Mann,Christiane Daughter [phone]  [phone]       Code Status History    Date Active Date Inactive Code Status Order ID Comments User Context   03/28/2016 12:17 AM 04/07/2016  7:19 PM Full Code [id number]  Karmen Bongo, MD ED   02/29/2016  5:07 PM 03/03/2016  8:15 PM Partial Code [id number]  Caren Griffins, MD ED   02/13/2016 11:49 PM 02/17/2016  9:42 PM Full Code [id number]  Lily Kocher, MD Inpatient   06/23/2015 12:10 PM 06/27/2015 10:52 PM Full Code [id number]  Rondel Jumbo, PA-C ED   11/12/2014 10:00 PM 11/15/2014  4:54 PM Full Code [id number]  Ivor Costa, MD ED   10/28/2014  5:02 AM 10/30/2014  9:46 PM Full Code [id number]  Theressa Millard, MD ED   08/14/2014  7:09 AM 08/16/2014  3:50 PM Full Code [id number]  Lavina Hamman, MD ED   01/06/2014  4:34 PM 01/07/2014  3:05 PM DNR [id number]  Domenic Polite, MD Inpatient   08/08/2013  7:43 PM 08/12/2013  8:44 PM Full Code [id number]  Rigoberto Noel, MD ED   06/04/2011  3:36 AM 06/05/2011  7:23 PM Full Code [id number]  Lutricia Feil, RN Inpatient       Chief Complaint  Patient presents with  . Hospitalization Follow-up    HISTORY OF PRESENT ILLNESS:  This is a 56-YO female seen for hospital follow-up.  She was admitted to [facility] for short-term rehabilitation on 04/07/2016 following an admission at [facility] 03/27/2016-04/07/2016 for AMS secondary to polypharmacy, PNA, and collitis. CT head was negative. Ativan, morphine, Haldol, Lyrica, Zanaflex and Geodon were held. She had multiple episodes of pneumonia in the last 6 weeks. She was treated for enteritis with Cipro and Flagyl.  She was seen in the room today and complained of low back  pain. She was requesting for Oxycodone.  PAST MEDICAL HISTORY:  Past Medical History:  Diagnosis Date  . Anemia   . Anxiety   . Arthritis    "pretty much all my joints"  . Asthma   . Benign paroxysmal positional vertigo 12/30/2012  . Breast cancer ([place]) 02/13/12   ruq  100'clock bx Ductal Carcinoma in Situ,(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  . Breast mass in female    L breast 2008, US showed likely fat necrosis  . Chronic lower back pain   . CKD (chronic kidney disease), stage III    "lower stage" (01/06/2014)  . Daily headache   . Depression   . GERD (gastroesophageal reflux disease)   . History of blood transfusion 2011   "after one of my OR's"  . History of hiatal hernia   . History of stomach ulcers   . HSV (herpes simplex virus) infection   . Hx of radiation therapy 05/05/12- 07/15/12   right breast, 45 gray x 25 fx, lumpectomy cavity boosted to 16.2 gray  . Hyperlipemia   . Hypertension    sees Dr. Criss Rosales , [facility]  . Hypothyroidism   . Kidney stones   . Knee pain, bilateral   . Migraines    "~ 3 times/month" (01/06/2014)  .  Obesity   . OSA on CPAP    pt does not know settings  . Pancreatitis    hx of  . Pneumonia 1950's  . Polyneuropathy in diabetes(357.2)   . Schizophrenia ([place])   . 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 Tachycardia    with sx in 2008  . Thyroid disease   . Type II diabetes mellitus ([facility])      CURRENT MEDICATIONS: Reviewed  Patient's Medications  New Prescriptions   No medications on file  Previous Medications   ALBUTEROL (PROAIR HFA) 108 (90 BASE) MCG/ACT INHALER    Inhale 2 puffs into the lungs every 4 (four) hours as needed for wheezing or shortness of breath.    ALBUTEROL (PROVENTIL) (2.5 MG/3ML) 0.083% NEBULIZER SOLUTION    Inhale 3 mLs into the lungs every 6 (six) hours as needed. For shortness of breath   ASPIRIN EC 81 MG TABLET    Take 1 tablet (81 mg total) by mouth daily.   B COMPLEX-C (B-COMPLEX WITH VITAMIN C) TABLET    Take  1 tablet by mouth daily.   CARVEDILOL (COREG) 3.125 MG TABLET    Take 1 tablet (3.125 mg total) by mouth 2 (two) times daily with a meal.   CHOLECALCIFEROL (VITAMIN D-3) 1000 UNITS CAPS    Take 1,000 Units by mouth daily.   CIPROFLOXACIN (CIPRO) 500 MG TABLET    Take 1 tablet (500 mg total) by mouth 2 (two) times daily.   DOCUSATE SODIUM (COLACE) 100 MG CAPSULE    Take 200 mg by mouth at bedtime.    ESOMEPRAZOLE (NEXIUM) 40 MG CAPSULE    Take 1 capsule (40 mg total) by mouth daily before breakfast.   FERROUS SULFATE 325 (65 FE) MG EC TABLET    Take 1 tablet (325 mg total) by mouth daily with breakfast.   FUROSEMIDE (LASIX) 40 MG TABLET    Take 1 tablet (40 mg total) by mouth daily.   INSULIN DETEMIR (LEVEMIR FLEXTOUCH) 100 UNIT/ML PEN    Inject 10 Units into the skin at bedtime.    LEVOTHYROXINE (SYNTHROID, LEVOTHROID) 150 MCG TABLET    Take 1 tablet (150 mcg total) by mouth daily before breakfast.   METRONIDAZOLE (FLAGYL) 500 MG TABLET    Take 1 tablet (500 mg total) by mouth every 8 (eight) hours.   MULTIPLE VITAMINS-MINERALS (ONE-A-DAY WOMENS 50+ ADVANTAGE) TABS    Take 1 tablet by mouth daily.   POLYETHYLENE GLYCOL (MIRALAX / GLYCOLAX) PACKET    Take 17 g by mouth daily.   ROSUVASTATIN (CRESTOR) 20 MG TABLET    Take 20 mg by mouth daily.   SITAGLIPTIN-METFORMIN (JANUMET) 50-500 MG PER TABLET    Take 1 tablet by mouth 2 (two) times daily with a meal.    VALACYCLOVIR (VALTREX) 1000 MG TABLET    Take 1,000 mg by mouth 2 (two) times daily.    VENLAFAXINE XR (EFFEXOR-XR) 150 MG 24 HR CAPSULE    Take 150 mg by mouth 2 (two) times daily.  Modified Medications   No medications on file  Discontinued Medications   No medications on file     Allergies  Allergen Reactions  . Bee Venom Anaphylaxis  . Shellfish Allergy Anaphylaxis  . Buprenorphine Hcl Other (See Comments)    Overly sedated     REVIEW OF SYSTEMS:  GENERAL: no change in appetite, no fatigue, no weight changes, no fever, chills  or weakness EYES: Denies change in vision, dry eyes, eye pain, itching or discharge  EARS: Denies change in hearing, ringing in ears, or earache NOSE: Denies nasal congestion or epistaxis MOUTH and THROAT: Denies oral discomfort, gingival pain or bleeding, pain from teeth or hoarseness   RESPIRATORY: no cough, SOB, DOE, wheezing, hemoptysis CARDIAC: no chest pain, or palpitations GI: no abdominal pain, diarrhea, constipation, heart burn, nausea or vomiting GU: Denies dysuria, frequency, hematuria, incontinence, or discharge PSYCHIATRIC: Denies feeling of depression or anxiety. No report of hallucinations, insomnia, paranoia, or agitation    PHYSICAL EXAMINATION  GENERAL APPEARANCE: Well nourished. In no acute distress.Morbidly obese SKIN:  Excoriated bilateral groin HEAD: Normal in size and contour. No evidence of trauma EYES: Lids open and close normally. No blepharitis, entropion or ectropion. PERRL. Conjunctivae are clear and sclerae are white. Lenses are without opacity EARS: Pinnae are normal. Patient hears normal voice tunes of the examiner MOUTH and THROAT: Lips are without lesions. Oral mucosa is moist and without lesions. Tongue is normal in shape, size, and color and without lesions NECK: supple, trachea midline, no neck masses, no thyroid tenderness, no thyromegaly LYMPHATICS: no LAN in the neck, no supraclavicular LAN RESPIRATORY: breathing is even & unlabored, BS CTAB CARDIAC: RRR, no murmur,no extra heart sounds, BLE 2+ edema GI: abdomen soft, normal BS, no masses, no tenderness, no hepatomegaly, no splenomegaly EXTREMITIES:  Able to move X 4 extremities PSYCHIATRIC: Alert and oriented X 3. Affect and behavior are appropriate   LABS/RADIOLOGY: Labs reviewed: Basic Metabolic Panel:  Recent Labs  03/30/16 1113 03/31/16 0531 04/01/16 0432 04/02/16 1639 04/03/16 0517  NA 145  --  145  --  141  K 4.0  --  3.6  --  4.2  CL 112*  --  113*  --  108  CO2 24  --  23  --   23  GLUCOSE 205*  --  196*  --  118*  BUN 23*  --  32*  --  26*  CREATININE 1.12* 1.08* 1.09*  --  1.09*  CALCIUM 9.2  --  8.5*  --  8.4*  MG 1.6*  --  2.0 2.0  --    Liver Function Tests:  Recent Labs  03/30/16 1113 04/01/16 0432 04/03/16 0517  AST _0 ALT _1 ALKPHOS 59 59 53  BILITOT 0.7 0.7 0.9  PROT 6.3* 6.1* 5.5*  ALBUMIN 3.2* 2.9* 3.0*    Recent Labs  06/23/15 0754 11/05/15 0255 02/13/16 1905  LIPASE _2 CBC:  Recent Labs  02/29/16 1127  03/27/16 1850  03/28/16 0917 03/29/16 0218 03/31/16 0531 04/01/16 0432  WBC 18.4*  < > 14.8*  --  10.2  10.3 10.9* 11.3* 15.3*  NEUTROABS 15.9*  --  12.2*  --  8.3*  --   --   --   HGB 8.9*  < > 7.8*  < > 7.6*  7.6* 8.5* 8.7* 10.4*  HCT 28.1*  < > 24.8*  < > 24.0*  23.8* 27.0* 27.8* 33.1*  MCV 84.6  < > 82.7  --  82.8  82.6 83.1 83.5 82.8  PLT 305  < > 252  --  201  206 224 217 229  < > = values in this interval not displayed. Lipid Panel:  Recent Labs  02/29/16 1127  HDL 55   Cardiac Enzymes:  Recent Labs  06/24/15 1211 06/24/15 1747 02/29/16 1127  TROPONINI 0.30* 0.05* <0.03   CBG:  Recent Labs  04/06/16 2315 04/07/16 0812 04/07/16 1156  GLUCAP 129*  161* 181*      Ct Abdomen Pelvis Wo Contrast  Result Date: 03/28/2016 CLINICAL DATA:  Right flank pain EXAM: CT ABDOMEN AND PELVIS WITHOUT CONTRAST TECHNIQUE: Multidetector CT imaging of the abdomen and pelvis was performed following the standard protocol without IV contrast. COMPARISON:  06/23/2015 FINDINGS: Lower chest: Mild ground-glass densities in the left greater than right posterior lower lobes. No effusion. The heart appears borderline enlarged. Hepatobiliary: No focal liver abnormality is seen. Status post cholecystectomy. No biliary dilatation. Pancreas: Unremarkable. No pancreatic ductal dilatation or surrounding inflammatory changes. Spleen: Normal in size without focal abnormality. Adrenals/Urinary Tract: Adrenal  glands are unremarkable. Kidneys are normal, without renal calculi, focal lesion, or hydronephrosis. Bladder is unremarkable. Nonspecific perinephric fat stranding. Stomach/Bowel: Stomach is within normal limits. Appendix contains a few stones but is otherwise normal. No evidence of bowel wall thickening, distention, or inflammatory changes. Large amount of stool in the right and transverse colon. Vascular/Lymphatic: Aortic atherosclerosis. No enlarged abdominal or pelvic lymph nodes. Reproductive: Status post hysterectomy. No adnexal masses. Other: Small fat containing umbilical hernia. No free air or free fluid. Musculoskeletal: Degenerative changes. No acute or suspicious bone lesions. IMPRESSION: 1. Negative for nephrolithiasis hydronephrosis or ureteral stone. 2. Mild density in the left greater than right posterior lower lobes could reflect pneumonia or possible aspiration. Electronically Signed   By: Donavan Foil M.D.   On: 03/28/2016 01:13   Dg Chest 2 View  Result Date: 03/29/2016 CLINICAL DATA:  Difficulty breathing, hypoxia EXAM: CHEST  2 VIEW COMPARISON:  03/27/2016 FINDINGS: Increased interstitial markings. Mild patchy lingular/left lower lobe opacity, pneumonia not excluded. No pleural effusion or pneumothorax. The heart is top-normal in size. Surgical clips overlying the right neck. IMPRESSION: Mild patchy lingular/left lower lobe opacity, pneumonia not excluded. Increased interstitial markings without frank interstitial edema. Electronically Signed   By: Julian Hy M.D.   On: 03/29/2016 17:54   Dg Chest 2 View  Result Date: 03/27/2016 CLINICAL DATA:  Sepsis EXAM: CHEST  2 VIEW COMPARISON:  02/29/2016 FINDINGS: Low lung volumes. Borderline cardiomegaly. No aortic aneurysm. Mild bilateral interstitial edema. Possible superimposed pneumonia at each lung base left greater than right given slightly more confluent airspace opacities noted. Surgical clips are seen at the base the neck as  before. No sizable effusion. No pneumothorax identified. No acute osseous abnormality. Small calcification about the right shoulder may reflect calcific tendinopathy. IMPRESSION: Low lung volumes with borderline cardiomegaly and interstitial edema. Subtle airspace opacities at the lung bases left greater than right cannot exclude superimposed pneumonia. Electronically Signed   By: Ashley Royalty M.D.   On: 03/27/2016 18:11   Dg Abd 1 View  Result Date: 04/02/2016 CLINICAL DATA:  Nausea and vomiting starting yesterday EXAM: ABDOMEN - 1 VIEW COMPARISON:  04/01/2016 FINDINGS: Mild gaseous distended small bowel loops are noted in left mid abdomen suspicious for ileus, enteritis or early bowel obstruction. Some colonic gas noted in right colon and transverse colon. Colonic gas noted within rectum. IMPRESSION: Mild gaseous distended small bowel loops in left mid abdomen suspicious for mild ileus enteritis or less likely small bowel obstruction. Electronically Signed   By: Lahoma Crocker M.D.   On: 04/02/2016 10:36   Ct Head Wo Contrast  Result Date: 04/01/2016 CLINICAL DATA:  Acute onset of altered mental status. Initial encounter. EXAM: CT HEAD WITHOUT CONTRAST TECHNIQUE: Contiguous axial images were obtained from the base of the skull through the vertex without intravenous contrast. COMPARISON:  CT of the head performed 03/27/2016, and  MRI of the brain performed 02/29/2016 FINDINGS: Brain: No evidence of acute infarction, hemorrhage, hydrocephalus, extra-axial collection or mass lesion/mass effect. Prominence of the ventricles and sulci reflects mild cortical volume loss. Mild cerebellar atrophy is noted. The brainstem and fourth ventricle are within normal limits. The basal ganglia are unremarkable in appearance. The cerebral hemispheres demonstrate grossly normal gray-white differentiation. No mass effect or midline shift is seen. Vascular: No hyperdense vessel or unexpected calcification. Skull: There is no evidence  of fracture; visualized osseous structures are unremarkable in appearance. A mildly prominent empty sella is noted. Sinuses/Orbits: The orbits are within normal limits. The paranasal sinuses and mastoid air cells are well-aerated. Other: No significant soft tissue abnormalities are seen. Prominent cerumen is seen at the external auditory canals bilaterally, larger on the right. IMPRESSION: 1. No acute intracranial pathology seen on CT. 2. Mild cortical volume loss noted. 3. Prominent cerumen at the external auditory canals bilaterally. Electronically Signed   By: Garald Balding M.D.   On: 04/01/2016 21:53   Ct Head Wo Contrast  Result Date: 03/27/2016 CLINICAL DATA:  Multiple falls. EXAM: CT HEAD WITHOUT CONTRAST TECHNIQUE: Contiguous axial images were obtained from the base of the skull through the vertex without intravenous contrast. COMPARISON:  02/29/2016 FINDINGS: Brain: There is no evidence for acute hemorrhage, hydrocephalus, mass lesion, or abnormal extra-axial fluid collection. No definite CT evidence for acute infarction. Vascular: No hyperdense vessel or unexpected calcification. Skull: Normal. Negative for fracture or focal lesion. Sinuses/Orbits: Visualized portions of the paranasal sinuses are clear. Right mastoid air cells are clear. Fluid is visible in the left mastoid air cells. Visualized portions of the globes and intraorbital fat are unremarkable. Other: None. IMPRESSION: 1. No acute intracranial abnormality. 2. Left mastoid effusion. Electronically Signed   By: Misty Stanley M.D.   On: 03/27/2016 17:50   Ct Abdomen Pelvis W Contrast  Result Date: 04/02/2016 CLINICAL DATA:  Pneumonia, altered mental status.  Abdominal pain. EXAM: CT ABDOMEN AND PELVIS WITH CONTRAST TECHNIQUE: Multidetector CT imaging of the abdomen and pelvis was performed using the standard protocol following bolus administration of intravenous contrast. CONTRAST:  100 cc Isovue 300 IV COMPARISON:  Same day KUB, CT from  03/28/2016 FINDINGS: Lower chest: Small pleural effusions bilaterally slightly increased since prior exam with adjacent atelectasis. Borderline cardiomegaly. No pericardial effusion. Hepatobiliary: Cholecystectomy. No focal liver lesion or biliary dilatation. Pancreas: Fatty atrophy of the pancreas without focal mass or ductal dilatation. No surrounding peripancreatic inflammation. Spleen: Normal size without focal abnormality. Adrenals/Urinary Tract: Normal bilateral adrenal glands and kidneys without obstructive uropathy. Physiologically distended appearance of the bladder. Stomach/Bowel: Contracted stomach. Normal small bowel rotation. Mild jejunal thickening without significant fluid-filled distention. Enteric contrast passes into large bowel. No evidence acute appendicitis. Vascular/Lymphatic: Aortic atherosclerosis. No enlarged abdominal or pelvic lymph nodes. Reproductive: Status post hysterectomy. No adnexal masses. Other: Small fat containing umbilical hernia. No free air nor free fluid. Left lower quadrant iatrogenic subcutaneous emphysema likely related injection subcutaneous medication. Musculoskeletal: Degenerate changes of the dorsal spine. No acute nor suspicious osseous lesions. IMPRESSION: 1. Moderate diffuse thickening of jejunum since prior exam suspicious for enteritis. No mechanical bowel obstruction or pneumatosis. 2. Slight increase in small bilateral pleural effusions. Bibasilar atelectasis without pneumonic consolidations. 3. Small fat containing umbilical hernia. Electronically Signed   By: Ashley Royalty M.D.   On: 04/02/2016 22:51   Nm Pulmonary Perf And Vent  Result Date: 03/29/2016 CLINICAL DATA:  Acute respiratory failure, hypoxia, cough EXAM: NUCLEAR MEDICINE VENTILATION -  PERFUSION LUNG SCAN TECHNIQUE: Ventilation images were obtained in multiple projections using inhaled aerosol Tc-53mDTPA. Perfusion images were obtained in multiple projections after intravenous injection of  Tc-944mAA. RADIOPHARMACEUTICALS:  32.5 mCi Technetium-9935mPA aerosol inhalation and 4.32 mCi Technetium-27m83m IV COMPARISON:  None. FINDINGS: Ventilation: Mildly heterogeneous.  No focal ventilation defect. Perfusion: No wedge shaped peripheral perfusion defects to suggest acute pulmonary embolism. IMPRESSION: Negative for pulmonary embolism. Electronically Signed   By: SriyJulian Hy.   On: 03/29/2016 17:50   Dg Abd Portable 1v  Result Date: 04/01/2016 CLINICAL DATA:  Vomiting episodes.  Abdominal pain . EXAM: PORTABLE ABDOMEN - 1 VIEW COMPARISON:  CT 03/28/2016. FINDINGS: Soft tissue structures are unremarkable. Surgical clips right upper quadrant. No bowel distention. No free air. Calcifications in pelvis consistent phleboliths. Degenerative changes lumbar spine and both hips. IMPRESSION: No acute or focal abnormality. Electronically Signed   By: ThomMarcello Mooresgister   On: 04/01/2016 12:18    ASSESSMENT/PLAN:  Physical deconditioning - for rehabilitation, PT and OT, for therapeutic strengthening exercises; fall precautions  Acute encephalopathy - multifactorial secondary to polypharmacy, pneumonia and colitis; CT head negative. Active on, morphine, Haldol, Lyrica, Zanaflex and Geodon were held. She had pneumonia and completed 5 days course of cefepime and vancomycin. She was treated with Cipro and Flagyl on discharge for enteritis.  Chronic pain syndrome - Ativan, morphine, Lyrica, Zanaflex and Geodon were held in the hospital; PMR consult  Schizophrenia - Haldol IV 2 mg twice a day was held; psych consult with team health  Dysphagia - ST evaluation and treatment for swallowing function; aspiration precaution  HCAP - completed cefepime and vancomycin 5 day course; monitor for fever, cough, etc  Enteritis - she was complaining of abdominal pain, KUB result suspicious of mild ileus enteritis, continue Cipro and Flagyl to complete 10-day course  Acute renal failure - was given IV  fluids; check BMP Lab Results  Component Value Date   CREATININE 1.09 (H) 04/03/2016   Anemia, iron deficiency  - continue FeSO4 325 mg 1 tab PO Q D; check CBC Lab Results  Component Value Date   HGB 10.4 (L) 04/01/2016   Hypothyroidism - check tsh and free T4; continue Synthroid 150 g 1 tab by mouth daily  Diabetes mellitus, type 2 -  Continue Levemir 100 units/mL inject 10 units every daily at bedtime, Janumet 50-500 mg 1 tab by mouth twice a day and CBG twice a day and at bedtime Lab Results  Component Value Date   HGBA1C 6.1 (H) 02/16/2016   Hypomagnesemia - check Mg level  Depression - continue Effexoror XR 150 mg 1 capsule by mouth twice a day  GERD - continue Nexium 40 mg 1 capsule by mouth daily  Hypertension -continue Coreg 3.125 mg 1 tab by mouth twice a day  Bilateral lower extremity edema - continue Lasix 40 mg 1 tab by mouth daily, check BMP  Hyperlipidemia - continue Crestor 20 mg 1 tab by mouth daily    Goals of care:  Short-term rehabilitation   Kayona Foor C. [facility] Dulce[facility]    [employer]-[phone]

## 2016-04-09 ENCOUNTER — Other Ambulatory Visit: Payer: Self-pay

## 2016-04-09 DIAGNOSIS — R2681 Unsteadiness on feet: Secondary | ICD-10-CM | POA: Diagnosis not present

## 2016-04-09 DIAGNOSIS — M545 Low back pain: Secondary | ICD-10-CM | POA: Diagnosis not present

## 2016-04-09 DIAGNOSIS — M25512 Pain in left shoulder: Secondary | ICD-10-CM | POA: Diagnosis not present

## 2016-04-09 DIAGNOSIS — G8929 Other chronic pain: Secondary | ICD-10-CM | POA: Diagnosis not present

## 2016-04-09 DIAGNOSIS — M25561 Pain in right knee: Secondary | ICD-10-CM | POA: Diagnosis not present

## 2016-04-09 MED ORDER — OXYCODONE HCL 5 MG PO TABS: 5.0000 mg | ORAL_TABLET | Freq: Three times a day (TID) | ORAL | 0 refills | Status: DC | PRN

## 2016-04-09 NOTE — Telephone Encounter (Signed)
Rx faxed to [facility] @ [phone], phone number [phone]  

## 2016-04-10 DIAGNOSIS — M25512 Pain in left shoulder: Secondary | ICD-10-CM | POA: Diagnosis not present

## 2016-04-10 DIAGNOSIS — R2681 Unsteadiness on feet: Secondary | ICD-10-CM | POA: Diagnosis not present

## 2016-04-10 DIAGNOSIS — M545 Low back pain: Secondary | ICD-10-CM | POA: Diagnosis not present

## 2016-04-10 DIAGNOSIS — G8929 Other chronic pain: Secondary | ICD-10-CM | POA: Diagnosis not present

## 2016-04-10 DIAGNOSIS — M25561 Pain in right knee: Secondary | ICD-10-CM | POA: Diagnosis not present

## 2016-04-10 LAB — BASIC METABOLIC PANEL
BUN: 10 mg/dL (ref 4–21)
Creatinine: 0.9 mg/dL (ref 0.5–1.1)
GLUCOSE: 139 mg/dL
POTASSIUM: 3.2 mmol/L — AB (ref 3.4–5.3)
SODIUM: 139 mmol/L (ref 137–147)

## 2016-04-10 LAB — CBC AND DIFFERENTIAL
HEMATOCRIT: 29 % — AB (ref 36–46)
HEMOGLOBIN: 9.2 g/dL — AB (ref 12.0–16.0)
Platelets: 322 10*3/uL (ref 150–399)
WBC: 11 10^3/mL

## 2016-04-10 LAB — TSH: TSH: 10.93 u[IU]/mL — AB (ref 0.41–5.90)

## 2016-04-14 DIAGNOSIS — G8929 Other chronic pain: Secondary | ICD-10-CM | POA: Diagnosis not present

## 2016-04-14 DIAGNOSIS — R2681 Unsteadiness on feet: Secondary | ICD-10-CM | POA: Diagnosis not present

## 2016-04-14 DIAGNOSIS — M25561 Pain in right knee: Secondary | ICD-10-CM | POA: Diagnosis not present

## 2016-04-14 DIAGNOSIS — M25512 Pain in left shoulder: Secondary | ICD-10-CM | POA: Diagnosis not present

## 2016-04-14 DIAGNOSIS — M545 Low back pain: Secondary | ICD-10-CM | POA: Diagnosis not present

## 2016-04-15 DIAGNOSIS — M545 Low back pain: Secondary | ICD-10-CM | POA: Diagnosis not present

## 2016-04-15 DIAGNOSIS — G8929 Other chronic pain: Secondary | ICD-10-CM | POA: Diagnosis not present

## 2016-04-15 DIAGNOSIS — M25512 Pain in left shoulder: Secondary | ICD-10-CM | POA: Diagnosis not present

## 2016-04-15 DIAGNOSIS — M25561 Pain in right knee: Secondary | ICD-10-CM | POA: Diagnosis not present

## 2016-04-15 DIAGNOSIS — R2681 Unsteadiness on feet: Secondary | ICD-10-CM | POA: Diagnosis not present

## 2016-04-16 DIAGNOSIS — M25561 Pain in right knee: Secondary | ICD-10-CM | POA: Diagnosis not present

## 2016-04-16 DIAGNOSIS — M25512 Pain in left shoulder: Secondary | ICD-10-CM | POA: Diagnosis not present

## 2016-04-16 DIAGNOSIS — R2681 Unsteadiness on feet: Secondary | ICD-10-CM | POA: Diagnosis not present

## 2016-04-16 DIAGNOSIS — M545 Low back pain: Secondary | ICD-10-CM | POA: Diagnosis not present

## 2016-04-16 DIAGNOSIS — G8929 Other chronic pain: Secondary | ICD-10-CM | POA: Diagnosis not present

## 2016-04-16 LAB — BASIC METABOLIC PANEL
BUN: 8 mg/dL (ref 4–21)
CREATININE: 0.9 mg/dL (ref 0.5–1.1)
Glucose: 109 mg/dL
POTASSIUM: 3.8 mmol/L (ref 3.4–5.3)
SODIUM: 135 mmol/L — AB (ref 137–147)

## 2016-04-17 ENCOUNTER — Other Ambulatory Visit: Payer: Self-pay | Admitting: Adult Health

## 2016-04-17 ENCOUNTER — Non-Acute Institutional Stay (SKILLED_NURSING_FACILITY): Payer: Medicare Other | Admitting: Adult Health

## 2016-04-17 ENCOUNTER — Encounter: Payer: Self-pay | Admitting: Adult Health

## 2016-04-17 DIAGNOSIS — R1312 Dysphagia, oropharyngeal phase: Secondary | ICD-10-CM | POA: Diagnosis not present

## 2016-04-17 DIAGNOSIS — R5381 Other malaise: Secondary | ICD-10-CM

## 2016-04-17 DIAGNOSIS — E118 Type 2 diabetes mellitus with unspecified complications: Secondary | ICD-10-CM | POA: Diagnosis not present

## 2016-04-17 DIAGNOSIS — G894 Chronic pain syndrome: Secondary | ICD-10-CM | POA: Diagnosis not present

## 2016-04-17 DIAGNOSIS — J189 Pneumonia, unspecified organism: Secondary | ICD-10-CM | POA: Diagnosis not present

## 2016-04-17 DIAGNOSIS — F200 Paranoid schizophrenia: Secondary | ICD-10-CM

## 2016-04-17 DIAGNOSIS — D509 Iron deficiency anemia, unspecified: Secondary | ICD-10-CM | POA: Diagnosis not present

## 2016-04-17 DIAGNOSIS — G4700 Insomnia, unspecified: Secondary | ICD-10-CM

## 2016-04-17 DIAGNOSIS — K219 Gastro-esophageal reflux disease without esophagitis: Secondary | ICD-10-CM

## 2016-04-17 DIAGNOSIS — E785 Hyperlipidemia, unspecified: Secondary | ICD-10-CM

## 2016-04-17 DIAGNOSIS — I10 Essential (primary) hypertension: Secondary | ICD-10-CM

## 2016-04-17 DIAGNOSIS — K529 Noninfective gastroenteritis and colitis, unspecified: Secondary | ICD-10-CM

## 2016-04-17 DIAGNOSIS — F329 Major depressive disorder, single episode, unspecified: Secondary | ICD-10-CM

## 2016-04-17 DIAGNOSIS — Z794 Long term (current) use of insulin: Secondary | ICD-10-CM

## 2016-04-17 DIAGNOSIS — E038 Other specified hypothyroidism: Secondary | ICD-10-CM

## 2016-04-17 DIAGNOSIS — G9340 Encephalopathy, unspecified: Secondary | ICD-10-CM

## 2016-04-17 DIAGNOSIS — C50411 Malignant neoplasm of upper-outer quadrant of right female breast: Secondary | ICD-10-CM

## 2016-04-17 DIAGNOSIS — R600 Localized edema: Secondary | ICD-10-CM

## 2016-04-17 DIAGNOSIS — F32A Depression, unspecified: Secondary | ICD-10-CM

## 2016-04-17 NOTE — Progress Notes (Signed)
DATE:  04/17/2016   MRN:  [id number]  BIRTHDAY: May 31, 1955  Facility:  Nursing Home Location:  [place] Room Number: 0109-N  LEVEL OF CARE:  SNF (31)      Contact Information    Name Relation Home Work [place] Son   [phone]   Mann,Christiane Daughter [phone]  [phone]       Code Status History    Date Active Date Inactive Code Status Order ID Comments User Context   03/28/2016 12:17 AM 04/07/2016  7:19 PM Full Code [id number]  Karmen Bongo, MD ED   02/29/2016  5:07 PM 03/03/2016  8:15 PM Partial Code [id number]  Caren Griffins, MD ED   02/13/2016 11:49 PM 02/17/2016  9:42 PM Full Code [id number]  Lily Kocher, MD Inpatient   06/23/2015 12:10 PM 06/27/2015 10:52 PM Full Code [id number]  Rondel Jumbo, PA-C ED   11/12/2014 10:00 PM 11/15/2014  4:54 PM Full Code [id number]  Ivor Costa, MD ED   10/28/2014  5:02 AM 10/30/2014  9:46 PM Full Code [id number]  Theressa Millard, MD ED   08/14/2014  7:09 AM 08/16/2014  3:50 PM Full Code [id number]  Lavina Hamman, MD ED   01/06/2014  4:34 PM 01/07/2014  3:05 PM DNR [id number]  Domenic Polite, MD Inpatient   08/08/2013  7:43 PM 08/12/2013  8:44 PM Full Code [id number]  Rigoberto Noel, MD ED   06/04/2011  3:36 AM 06/05/2011  7:23 PM Full Code [id number]  Lutricia Feil, RN Inpatient       Chief Complaint  Patient presents with  . Discharge Note    HISTORY OF PRESENT ILLNESS:  This is a 62-YO female seen for a discharge visit.  She is discharging home 04/19/2016 with home health PT, OT, and nursing services.    She was seen in the room and complained of insomnia.   She was admitted to [facility] for short-term rehabilitation on 04/07/2016 following an admission at [facility] 03/27/2016-04/07/2016 for AMS secondary to polypharmacy, PNA, and collitis. CT head was negative. Ativan, morphine, Haldol, Lyrica, Zanaflex and Geodon were held. She had multiple episodes of pneumonia in the  last 6 weeks. She was treated for enteritis with Cipro and Flagyl.  Patient was admitted to this facility for short-term rehabilitation after the patient's recent hospitalization.  Patient has completed SNF rehabilitation and therapy has cleared the patient for discharge.  PAST MEDICAL HISTORY:  Past Medical History:  Diagnosis Date  . Anemia   . Anxiety   . Arthritis    "pretty much all my joints"  . Asthma   . Benign paroxysmal positional vertigo 12/30/2012  . Breast cancer ([place]) 02/13/12   ruq  100'clock bx Ductal Carcinoma in Situ,(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  . Breast mass in female    L breast 2008, US showed likely fat necrosis  . Chronic lower back pain   . CKD (chronic kidney disease), stage III    "lower stage" (01/06/2014)  . Daily headache   . Depression   . GERD (gastroesophageal reflux disease)   . History of blood transfusion 2011   "after one of my OR's"  . History of hiatal hernia   . History of stomach ulcers   . HSV (herpes simplex virus) infection   . Hx of radiation therapy 05/05/12- 07/15/12   right breast, 45 gray x 25 fx, lumpectomy cavity boosted to 16.2 gray  . Hyperlipemia   .  Hypertension    sees Dr. Criss Rosales , [facility]  . Hypothyroidism   . Kidney stones   . Knee pain, bilateral   . Migraines    "~ 3 times/month" (01/06/2014)  . Obesity   . OSA on CPAP    pt does not know settings  . Pancreatitis    hx of  . Pneumonia 1950's  . Polyneuropathy in diabetes(357.2)   . Schizophrenia ([place])   . 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 Tachycardia    with sx in 2008  . Thyroid disease   . Type II diabetes mellitus ([facility])      CURRENT MEDICATIONS: Reviewed  Patient's Medications  New Prescriptions   No medications on file  Previous Medications   ACETAMINOPHEN (TYLENOL) 500 MG TABLET    Take 500 mg by mouth every 6 (six) hours. Scheduled until 3/8 and then starting 3/9 q6h PRN   ALBUTEROL (PROAIR HFA) 108 (90 BASE) MCG/ACT INHALER    Inhale 2  puffs into the lungs every 4 (four) hours as needed for wheezing or shortness of breath.    ALBUTEROL (PROVENTIL) (2.5 MG/3ML) 0.083% NEBULIZER SOLUTION    Inhale 3 mLs into the lungs every 6 (six) hours as needed. For shortness of breath   ASPIRIN EC 81 MG TABLET    Take 1 tablet (81 mg total) by mouth daily.   B COMPLEX-C (B-COMPLEX WITH VITAMIN C) TABLET    Take 1 tablet by mouth daily.   CARVEDILOL (COREG) 3.125 MG TABLET    Take 1 tablet (3.125 mg total) by mouth 2 (two) times daily with a meal.   CHOLECALCIFEROL (VITAMIN D-3) 1000 UNITS CAPS    Take 1,000 Units by mouth daily.   DICLOFENAC SODIUM (VOLTAREN) 1 % GEL    Apply 2-4 g topically every 8 (eight) hours. Apply 2 gm to right wrist and 4 mg to both knees x2 weeks   ESOMEPRAZOLE (NEXIUM) 40 MG CAPSULE    Take 1 capsule (40 mg total) by mouth daily before breakfast.   FERROUS SULFATE 325 (65 FE) MG EC TABLET    Take 1 tablet (325 mg total) by mouth daily with breakfast.   FUROSEMIDE (LASIX) 40 MG TABLET    Take 1 tablet (40 mg total) by mouth daily.   INSULIN DETEMIR (LEVEMIR FLEXTOUCH) 100 UNIT/ML PEN    Inject 10 Units into the skin at bedtime.    LEVOTHYROXINE (SYNTHROID, LEVOTHROID) 175 MCG TABLET    Take 175 mcg by mouth daily before breakfast.   MAGNESIUM OXIDE (MAG-OX) 400 MG TABLET    Take 400 mg by mouth daily.   MENTHOL, TOPICAL ANALGESIC, (ICY HOT) 16 % LIQD    Apply 1 application topically every 8 (eight) hours. Apply to back q8h x2 weeks   MULTIPLE VITAMINS-MINERALS (ONE-A-DAY WOMENS 50+ ADVANTAGE) TABS    Take 1 tablet by mouth daily.   OXYCODONE (ROXICODONE) 5 MG IMMEDIATE RELEASE TABLET    Take 1 tablet (5 mg total) by mouth every 8 (eight) hours as needed for severe pain. >7/10   POTASSIUM CHLORIDE (K-DUR) 10 MEQ TABLET    Take 10 mEq by mouth daily.   ROSUVASTATIN (CRESTOR) 20 MG TABLET    Take 20 mg by mouth daily.   SITAGLIPTIN-METFORMIN (JANUMET) 50-500 MG PER TABLET    Take 1 tablet by mouth 2 (two) times daily with  a meal.    VENLAFAXINE XR (EFFEXOR-XR) 150 MG 24 HR CAPSULE    Take 150 mg by mouth 2 (  two) times daily.  Modified Medications   No medications on file  Discontinued Medications   DOCUSATE SODIUM (COLACE) 100 MG CAPSULE    Take 200 mg by mouth at bedtime.    LEVOTHYROXINE (SYNTHROID, LEVOTHROID) 150 MCG TABLET    Take 1 tablet (150 mcg total) by mouth daily before breakfast.   POLYETHYLENE GLYCOL (MIRALAX / GLYCOLAX) PACKET    Take 17 g by mouth daily.   VALACYCLOVIR (VALTREX) 1000 MG TABLET    Take 1,000 mg by mouth 2 (two) times daily.      Allergies  Allergen Reactions  . Bee Venom Anaphylaxis  . Shellfish Allergy Anaphylaxis  . Buprenorphine Hcl Other (See Comments)    Overly sedated     REVIEW OF SYSTEMS:  GENERAL: no change in appetite, no fatigue, no weight changes, no fever, chills or weakness EYES: Denies change in vision, dry eyes, eye pain, itching or discharge EARS: Denies change in hearing, ringing in ears, or earache NOSE: Denies nasal congestion or epistaxis MOUTH and THROAT: Denies oral discomfort, gingival pain or bleeding, pain from teeth or hoarseness   RESPIRATORY: no cough, SOB, DOE, wheezing, hemoptysis CARDIAC: no chest pain, or palpitations GI: no abdominal pain, diarrhea, constipation, heart burn, nausea or vomiting GU: Denies dysuria, frequency, hematuria, incontinence, or discharge PSYCHIATRIC: Denies feeling of depression or anxiety. No report of hallucinations, insomnia, paranoia, or agitation    PHYSICAL EXAMINATION  GENERAL APPEARANCE: Well nourished. In no acute distress.Morbidly obese SKIN:  Skin is warm and dry. HEAD: Normal in size and contour. No evidence of trauma EYES: Lids open and close normally. No blepharitis, entropion or ectropion. PERRL. Conjunctivae are clear and sclerae are white. Lenses are without opacity EARS: Pinnae are normal. Patient hears normal voice tunes of the examiner MOUTH and THROAT: Lips are without lesions. Oral  mucosa is moist and without lesions. Tongue is normal in shape, size, and color and without lesions NECK: supple, trachea midline, no neck masses, no thyroid tenderness, no thyromegaly LYMPHATICS: no LAN in the neck, no supraclavicular LAN RESPIRATORY: breathing is even & unlabored, BS CTAB CARDIAC: RRR, no murmur,no extra heart sounds, BLE 2+ edema GI: abdomen soft, normal BS, no masses, no tenderness, no hepatomegaly, no splenomegaly EXTREMITIES:  Able to move X 4 extremities PSYCHIATRIC: Alert and oriented X 3. Affect and behavior are appropriate   LABS/RADIOLOGY: Labs reviewed: Basic Metabolic Panel:  Recent Labs  03/30/16 1113  04/01/16 0432 04/02/16 1639 04/03/16 0517 04/10/16 04/16/16  NA 145  --  145  --  141 139 135*  K 4.0  --  3.6  --  4.2 3.2* 3.8  CL 112*  --  113*  --  108  --   --   CO2 24  --  23  --  23  --   --   GLUCOSE 205*  --  196*  --  118*  --   --   BUN 23*  --  32*  --  26* 10 8  CREATININE 1.12*  < > 1.09*  --  1.09* 0.9 0.9  CALCIUM 9.2  --  8.5*  --  8.4*  --   --   MG 1.6*  --  2.0 2.0  --   --   --   < > = values in this interval not displayed. Liver Function Tests:  Recent Labs  03/30/16 1113 04/01/16 0432 04/03/16 0517  AST _0 ALT _1 ALKPHOS 59 59 53  BILITOT 0.7 0.7 0.9  PROT 6.3* 6.1* 5.5*  ALBUMIN 3.2* 2.9* 3.0*    Recent Labs  06/23/15 0754 11/05/15 0255 02/13/16 1905  LIPASE _0 CBC:  Recent Labs  02/29/16 1127  03/27/16 1850  03/28/16 0917 03/29/16 0218 03/31/16 0531 04/01/16 0432 04/10/16  WBC 18.4*  < > 14.8*  --  10.2  10.3 10.9* 11.3* 15.3* 11.0  NEUTROABS 15.9*  --  12.2*  --  8.3*  --   --   --   --   HGB 8.9*  < > 7.8*  < > 7.6*  7.6* 8.5* 8.7* 10.4* 9.2*  HCT 28.1*  < > 24.8*  < > 24.0*  23.8* 27.0* 27.8* 33.1* 29*  MCV 84.6  < > 82.7  --  82.8  82.6 83.1 83.5 82.8  --   PLT 305  < > 252  --  201  206 224 217 229 322  < > = values in this interval not displayed. Lipid  Panel:  Recent Labs  02/29/16 1127  HDL 55   Cardiac Enzymes:  Recent Labs  06/24/15 1211 06/24/15 1747 02/29/16 1127  TROPONINI 0.30* 0.05* <0.03   CBG:  Recent Labs  04/06/16 2315 04/07/16 0812 04/07/16 1156  GLUCAP 129* 161* 181*      Ct Abdomen Pelvis Wo Contrast  Result Date: 03/28/2016 CLINICAL DATA:  Right flank pain EXAM: CT ABDOMEN AND PELVIS WITHOUT CONTRAST TECHNIQUE: Multidetector CT imaging of the abdomen and pelvis was performed following the standard protocol without IV contrast. COMPARISON:  06/23/2015 FINDINGS: Lower chest: Mild ground-glass densities in the left greater than right posterior lower lobes. No effusion. The heart appears borderline enlarged. Hepatobiliary: No focal liver abnormality is seen. Status post cholecystectomy. No biliary dilatation. Pancreas: Unremarkable. No pancreatic ductal dilatation or surrounding inflammatory changes. Spleen: Normal in size without focal abnormality. Adrenals/Urinary Tract: Adrenal glands are unremarkable. Kidneys are normal, without renal calculi, focal lesion, or hydronephrosis. Bladder is unremarkable. Nonspecific perinephric fat stranding. Stomach/Bowel: Stomach is within normal limits. Appendix contains a few stones but is otherwise normal. No evidence of bowel wall thickening, distention, or inflammatory changes. Large amount of stool in the right and transverse colon. Vascular/Lymphatic: Aortic atherosclerosis. No enlarged abdominal or pelvic lymph nodes. Reproductive: Status post hysterectomy. No adnexal masses. Other: Small fat containing umbilical hernia. No free air or free fluid. Musculoskeletal: Degenerative changes. No acute or suspicious bone lesions. IMPRESSION: 1. Negative for nephrolithiasis hydronephrosis or ureteral stone. 2. Mild density in the left greater than right posterior lower lobes could reflect pneumonia or possible aspiration. Electronically Signed   By: Donavan Foil M.D.   On: 03/28/2016  01:13   Dg Chest 2 View  Result Date: 03/29/2016 CLINICAL DATA:  Difficulty breathing, hypoxia EXAM: CHEST  2 VIEW COMPARISON:  03/27/2016 FINDINGS: Increased interstitial markings. Mild patchy lingular/left lower lobe opacity, pneumonia not excluded. No pleural effusion or pneumothorax. The heart is top-normal in size. Surgical clips overlying the right neck. IMPRESSION: Mild patchy lingular/left lower lobe opacity, pneumonia not excluded. Increased interstitial markings without frank interstitial edema. Electronically Signed   By: Julian Hy M.D.   On: 03/29/2016 17:54   Dg Chest 2 View  Result Date: 03/27/2016 CLINICAL DATA:  Sepsis EXAM: CHEST  2 VIEW COMPARISON:  02/29/2016 FINDINGS: Low lung volumes. Borderline cardiomegaly. No aortic aneurysm. Mild bilateral interstitial edema. Possible superimposed pneumonia at each lung base left greater than right given slightly more confluent airspace opacities noted. Surgical clips  are seen at the base the neck as before. No sizable effusion. No pneumothorax identified. No acute osseous abnormality. Small calcification about the right shoulder may reflect calcific tendinopathy. IMPRESSION: Low lung volumes with borderline cardiomegaly and interstitial edema. Subtle airspace opacities at the lung bases left greater than right cannot exclude superimposed pneumonia. Electronically Signed   By: Ashley Royalty M.D.   On: 03/27/2016 18:11   Dg Abd 1 View  Result Date: 04/02/2016 CLINICAL DATA:  Nausea and vomiting starting yesterday EXAM: ABDOMEN - 1 VIEW COMPARISON:  04/01/2016 FINDINGS: Mild gaseous distended small bowel loops are noted in left mid abdomen suspicious for ileus, enteritis or early bowel obstruction. Some colonic gas noted in right colon and transverse colon. Colonic gas noted within rectum. IMPRESSION: Mild gaseous distended small bowel loops in left mid abdomen suspicious for mild ileus enteritis or less likely small bowel obstruction.  Electronically Signed   By: Lahoma Crocker M.D.   On: 04/02/2016 10:36   Ct Head Wo Contrast  Result Date: 04/01/2016 CLINICAL DATA:  Acute onset of altered mental status. Initial encounter. EXAM: CT HEAD WITHOUT CONTRAST TECHNIQUE: Contiguous axial images were obtained from the base of the skull through the vertex without intravenous contrast. COMPARISON:  CT of the head performed 03/27/2016, and MRI of the brain performed 02/29/2016 FINDINGS: Brain: No evidence of acute infarction, hemorrhage, hydrocephalus, extra-axial collection or mass lesion/mass effect. Prominence of the ventricles and sulci reflects mild cortical volume loss. Mild cerebellar atrophy is noted. The brainstem and fourth ventricle are within normal limits. The basal ganglia are unremarkable in appearance. The cerebral hemispheres demonstrate grossly normal gray-white differentiation. No mass effect or midline shift is seen. Vascular: No hyperdense vessel or unexpected calcification. Skull: There is no evidence of fracture; visualized osseous structures are unremarkable in appearance. A mildly prominent empty sella is noted. Sinuses/Orbits: The orbits are within normal limits. The paranasal sinuses and mastoid air cells are well-aerated. Other: No significant soft tissue abnormalities are seen. Prominent cerumen is seen at the external auditory canals bilaterally, larger on the right. IMPRESSION: 1. No acute intracranial pathology seen on CT. 2. Mild cortical volume loss noted. 3. Prominent cerumen at the external auditory canals bilaterally. Electronically Signed   By: Garald Balding M.D.   On: 04/01/2016 21:53   Ct Head Wo Contrast  Result Date: 03/27/2016 CLINICAL DATA:  Multiple falls. EXAM: CT HEAD WITHOUT CONTRAST TECHNIQUE: Contiguous axial images were obtained from the base of the skull through the vertex without intravenous contrast. COMPARISON:  02/29/2016 FINDINGS: Brain: There is no evidence for acute hemorrhage, hydrocephalus,  mass lesion, or abnormal extra-axial fluid collection. No definite CT evidence for acute infarction. Vascular: No hyperdense vessel or unexpected calcification. Skull: Normal. Negative for fracture or focal lesion. Sinuses/Orbits: Visualized portions of the paranasal sinuses are clear. Right mastoid air cells are clear. Fluid is visible in the left mastoid air cells. Visualized portions of the globes and intraorbital fat are unremarkable. Other: None. IMPRESSION: 1. No acute intracranial abnormality. 2. Left mastoid effusion. Electronically Signed   By: Misty Stanley M.D.   On: 03/27/2016 17:50   Ct Abdomen Pelvis W Contrast  Result Date: 04/02/2016 CLINICAL DATA:  Pneumonia, altered mental status.  Abdominal pain. EXAM: CT ABDOMEN AND PELVIS WITH CONTRAST TECHNIQUE: Multidetector CT imaging of the abdomen and pelvis was performed using the standard protocol following bolus administration of intravenous contrast. CONTRAST:  100 cc Isovue 300 IV COMPARISON:  Same day KUB, CT from 03/28/2016 FINDINGS: Lower  chest: Small pleural effusions bilaterally slightly increased since prior exam with adjacent atelectasis. Borderline cardiomegaly. No pericardial effusion. Hepatobiliary: Cholecystectomy. No focal liver lesion or biliary dilatation. Pancreas: Fatty atrophy of the pancreas without focal mass or ductal dilatation. No surrounding peripancreatic inflammation. Spleen: Normal size without focal abnormality. Adrenals/Urinary Tract: Normal bilateral adrenal glands and kidneys without obstructive uropathy. Physiologically distended appearance of the bladder. Stomach/Bowel: Contracted stomach. Normal small bowel rotation. Mild jejunal thickening without significant fluid-filled distention. Enteric contrast passes into large bowel. No evidence acute appendicitis. Vascular/Lymphatic: Aortic atherosclerosis. No enlarged abdominal or pelvic lymph nodes. Reproductive: Status post hysterectomy. No adnexal masses. Other: Small  fat containing umbilical hernia. No free air nor free fluid. Left lower quadrant iatrogenic subcutaneous emphysema likely related injection subcutaneous medication. Musculoskeletal: Degenerate changes of the dorsal spine. No acute nor suspicious osseous lesions. IMPRESSION: 1. Moderate diffuse thickening of jejunum since prior exam suspicious for enteritis. No mechanical bowel obstruction or pneumatosis. 2. Slight increase in small bilateral pleural effusions. Bibasilar atelectasis without pneumonic consolidations. 3. Small fat containing umbilical hernia. Electronically Signed   By: Ashley Royalty M.D.   On: 04/02/2016 22:51   Nm Pulmonary Perf And Vent  Result Date: 03/29/2016 CLINICAL DATA:  Acute respiratory failure, hypoxia, cough EXAM: NUCLEAR MEDICINE VENTILATION - PERFUSION LUNG SCAN TECHNIQUE: Ventilation images were obtained in multiple projections using inhaled aerosol Tc-56mDTPA. Perfusion images were obtained in multiple projections after intravenous injection of Tc-914mAA. RADIOPHARMACEUTICALS:  32.5 mCi Technetium-9979mPA aerosol inhalation and 4.32 mCi Technetium-4m18m IV COMPARISON:  None. FINDINGS: Ventilation: Mildly heterogeneous.  No focal ventilation defect. Perfusion: No wedge shaped peripheral perfusion defects to suggest acute pulmonary embolism. IMPRESSION: Negative for pulmonary embolism. Electronically Signed   By: SriyJulian Hy.   On: 03/29/2016 17:50   Dg Abd Portable 1v  Result Date: 04/01/2016 CLINICAL DATA:  Vomiting episodes.  Abdominal pain . EXAM: PORTABLE ABDOMEN - 1 VIEW COMPARISON:  CT 03/28/2016. FINDINGS: Soft tissue structures are unremarkable. Surgical clips right upper quadrant. No bowel distention. No free air. Calcifications in pelvis consistent phleboliths. Degenerative changes lumbar spine and both hips. IMPRESSION: No acute or focal abnormality. Electronically Signed   By: ThomMarcello Mooresgister   On: 04/01/2016 12:18    ASSESSMENT/PLAN:  Physical  deconditioning - for Home health Nursing, PT and OT, for therapeutic strengthening exercises; fall precautions  Acute encephalopathy - now resolved; multifactorial secondary to polypharmacy, pneumonia and colitis; CT head negative. Active on, morphine, Haldol, Lyrica, Zanaflex and Geodon were held. She had pneumonia and completed 5 days course of cefepime and vancomycin. She has finished course of Cipro and Flagyl.  Chronic pain syndrome - she had PMR consult and was put on Oxycodone 5 mg 1 tab PO Q 8 H PRN, prescription not given since she will follow-up with pain clinic  Schizophrenia - stable  HCAP - completed cefepime and vancomycin 5 day course; resolved  Enteritis - no complaints of abdominal pain, completed Cipro and Flagyl course;  Anemia, iron deficiency  - continue FeSO4 325 mg 1 tab PO Q D Lab Results  Component Value Date   HGB 9.2 (A) 04/10/2016   Hypothyroidism - recently increased Synthroid from 150 g to 175 mcg 1 tab by mouth daily; for tsh in 1 month Lab Results  Component Value Date   TSH 10.93 (A) 04/10/2016    Diabetes mellitus, type 2 -  Continue Levemir 100 units/mL inject 10 units every daily at bedtime, Janumet 50-500 mg 1 tab by  mouth twice a day and CBG twice a day and at bedtime Lab Results  Component Value Date   HGBA1C 6.1 (H) 02/16/2016   Hypomagnesemia -  Mg level 1.5, recently started on Magnesium Oxide  400 mg  PO Q D; check Mg level on 04/18/16  Depression - mood is stable; continue Effexoror XR 150 mg 1 capsule by mouth twice a day  GERD - continue Nexium 40 mg 1 capsule by mouth daily  Hypertension - well-controlled; continue Coreg 3.125 mg 1 tab by mouth twice a day  Bilateral lower extremity edema - continue Lasix 40 mg 1 tab by mouth daily with KCL ER 10 meq 1 tab PO Q D  Hyperlipidemia - continue Crestor 20 mg 1 tab by mouth daily   Insomnia - start Melatonin 5 mg PO Q HS     I have filled out patient's discharge paperwork and  written prescriptions.  Patient will receive home health PT, OT and Nursing.  DME provided:  None Greater than 50% was spent in counseling and coordination of care with the patient.  Total discharge time: Greater than 30 minutes   Discharge time involved coordination of the discharge process with social worker, nursing staff and therapy department. Medical justification for home health services verified.    Kortney Schoenfelder C. [place] - NP    [employer] [phone]

## 2016-04-18 ENCOUNTER — Encounter: Payer: Self-pay | Admitting: Adult Health

## 2016-04-18 ENCOUNTER — Other Ambulatory Visit: Payer: Self-pay

## 2016-04-18 NOTE — Progress Notes (Deleted)
CLINIC:  Survivorship   REASON FOR VISIT:  Routine follow-up for history of breast cancer.   BRIEF ONCOLOGIC HISTORY:  (1)  status post right lumpectomy on 03/03/2012 for a 2.8 cm, grade 1 ductal carcinoma in situ, estrogen receptor 100% positive, progesterone receptor 100% positive, with clear margins.  (2) completed radiation therapy under the care of Dr. Sondra Come 07/15/2012  (3) started anastrozole June 2014. Stopped December 2015. Started letrozole daily in January 2016 stopped that same month.    INTERVAL HISTORY:  [name] presents to the [facility]orship [facility] today for routine follow-up for her history of breast cancer.  Overall, she reports feeling quite well. ***    REVIEW OF SYSTEMS:  ***     Breast: Denies any new nodularity, masses, tenderness, nipple changes, or nipple discharge.    A 14-point review of systems was completed and was negative, except as noted above.    PAST MEDICAL/SURGICAL HISTORY:  Past Medical History:  Diagnosis Date  . Anemia   . Anxiety   . Arthritis    "pretty much all my joints"  . Asthma   . Benign paroxysmal positional vertigo 12/30/2012  . Breast cancer ([place]) 02/13/12   ruq  100'clock bx Ductal Carcinoma in Situ,(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  . Breast mass in female    L breast 2008, US showed likely fat necrosis  . Chronic lower back pain   . CKD (chronic kidney disease), stage III    "lower stage" (01/06/2014)  . Daily headache   . Depression   . GERD (gastroesophageal reflux disease)   . History of blood transfusion 2011   "after one of my OR's"  . History of hiatal hernia   . History of stomach ulcers   . HSV (herpes simplex virus) infection   . Hx of radiation therapy 05/05/12- 07/15/12   right breast, 45 gray x 25 fx, lumpectomy cavity boosted to 16.2 gray  . Hyperlipemia   . Hypertension    sees Dr. Criss Rosales , [facility]   . Hypothyroidism   . Kidney stones   . Knee pain, bilateral   . Migraines    "~ 3  times/month" (01/06/2014)  . Obesity   . OSA on CPAP    pt does not know settings  . Pancreatitis    hx of  . Pneumonia 1950's  . Polyneuropathy in diabetes(357.2)   . Schizophrenia ([place])   . 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 Tachycardia    with sx in 2008  . Thyroid disease   . Type II diabetes mellitus ([place])    Past Surgical History:  Procedure Laterality Date  . ABDOMINAL HYSTERECTOMY  1979?   partial  . BREAST LUMPECTOMY  03/03/2012   Procedure: LUMPECTOMY;  Surgeon: Haywood Lasso, MD;  Location: [facility];  Service: General;  Laterality: Right;  . BREAST SURGERY Right 01/2012   "cancer"  . CHOLECYSTECTOMY  1980's?  . COLONOSCOPY WITH PROPOFOL N/A 09/28/2012   Procedure: COLONOSCOPY WITH PROPOFOL;  Surgeon: Lear Ng, MD;  Location: [facility];  Service: Endoscopy;  Laterality: N/A;  . FOOT FRACTURE SURGERY Right 1990's  . JOINT REPLACEMENT    . REVISION TOTAL KNEE ARTHROPLASTY  07/2009  . SHOULDER OPEN ROTATOR CUFF REPAIR Left 1990's  . THYROIDECTOMY  1970's  . TOTAL KNEE ARTHROPLASTY Bilateral 2009-06/2009   left; right     ALLERGIES:  Allergies  Allergen Reactions  . Bee Venom Anaphylaxis  . Shellfish Allergy Anaphylaxis  . Buprenorphine Hcl Other (See  Comments)    Overly sedated     CURRENT MEDICATIONS:  Outpatient Encounter Prescriptions as of 04/18/2016  Medication Sig  . acetaminophen (TYLENOL) 500 MG tablet Take 500 mg by mouth every 6 (six) hours. Scheduled until 3/8 and then starting 3/9 q6h PRN  . albuterol (PROAIR HFA) 108 (90 Base) MCG/ACT inhaler Inhale 2 puffs into the lungs every 4 (four) hours as needed for wheezing or shortness of breath.   [name] albuterol (PROVENTIL) (2.5 MG/3ML) 0.083% nebulizer solution Inhale 3 mLs into the lungs every 6 (six) hours as needed. For shortness of breath  . aspirin EC 81 MG tablet Take 1 tablet (81 mg total) by mouth daily.  . B Complex-C (B-COMPLEX WITH VITAMIN C) tablet Take 1 tablet by mouth  daily.  . carvedilol (COREG) 3.125 MG tablet Take 1 tablet (3.125 mg total) by mouth 2 (two) times daily with a meal.  . Cholecalciferol (VITAMIN D-3) 1000 units CAPS Take 1,000 Units by mouth daily.  . diclofenac sodium (VOLTAREN) 1 % GEL Apply 2-4 g topically every 8 (eight) hours. Apply 2 gm to right wrist and 4 mg to both knees x2 weeks  . esomeprazole (NEXIUM) 40 MG capsule Take 1 capsule (40 mg total) by mouth daily before breakfast.  . ferrous sulfate 325 (65 FE) MG EC tablet Take 1 tablet (325 mg total) by mouth daily with breakfast.  . furosemide (LASIX) 40 MG tablet Take 1 tablet (40 mg total) by mouth daily.  . Insulin Detemir (LEVEMIR FLEXTOUCH) 100 UNIT/ML Pen Inject 10 Units into the skin at bedtime.   [name] levothyroxine (SYNTHROID, LEVOTHROID) 175 MCG tablet Take 175 mcg by mouth daily before breakfast.  . magnesium oxide (MAG-OX) 400 MG tablet Take 400 mg by mouth daily.  . Menthol, Topical Analgesic, (ICY HOT) 16 % LIQD Apply 1 application topically every 8 (eight) hours. Apply to back q8h x2 weeks  . Multiple Vitamins-Minerals (ONE-A-DAY WOMENS 50+ ADVANTAGE) TABS Take 1 tablet by mouth daily.  [name] oxyCODONE (ROXICODONE) 5 MG immediate release tablet Take 1 tablet (5 mg total) by mouth every 8 (eight) hours as needed for severe pain. >7/10  . potassium chloride (K-DUR) 10 MEQ tablet Take 10 mEq by mouth daily.  . rosuvastatin (CRESTOR) 20 MG tablet Take 20 mg by mouth daily.  . sitaGLIPtin-metformin (JANUMET) 50-500 MG per tablet Take 1 tablet by mouth 2 (two) times daily with a meal.   . venlafaxine XR (EFFEXOR-XR) 150 MG 24 hr capsule Take 150 mg by mouth 2 (two) times daily.   No facility-administered encounter medications on file as of 04/18/2016.      ONCOLOGIC FAMILY HISTORY:  Family History  Problem Relation Age of Onset  . Heart disease Brother     Multiple MIs, starting in his 60s  . Diabetes Brother   . Hypertension Mother   . Diabetes Mother   . Breast cancer  Mother 75  . Bone cancer Mother   . Hypertension Sister   . Diabetes Sister   . Breast cancer Sister 6  . Breast cancer Maternal Grandmother   . Heart disease Maternal Grandmother   . Uterine cancer Other 19  . Breast cancer Paternal Aunt 74  . Breast cancer Paternal Grandmother     dx in her 43s  . Prostate cancer Paternal Grandfather     GENETIC COUNSELING/TESTING: ***  SOCIAL HISTORY:  WYNNIE PACETTI is /single/married/divorced/widowed/separated and lives alone/with her spouse/family/friend in (city), [place].  She has (#) children and  they live in (city).  [name] is currently retired/disabled/working part-time/full-time as ***.  She denies any current or history of tobacco, alcohol, or illicit drug use.     PHYSICAL EXAMINATION:  Vital Signs: There were no vitals filed for this visit. There were no vitals filed for this visit. General: Well-nourished, well-appearing female in no acute distress.  Unaccompanied/Accompanied by***** today.   HEENT: Head is normocephalic.  Pupils equal and reactive to light. Conjunctivae clear without exudate.  Sclerae anicteric. Oral mucosa is pink, moist.  Oropharynx is pink without lesions or erythema.  Lymph: No cervical, supraclavicular, or infraclavicular lymphadenopathy noted on palpation.  Cardiovascular: Regular rate and rhythm.[name] Respiratory: Clear to auscultation bilaterally. Chest expansion symmetric; breathing non-labored.  Breast Exam:  -Left breast: No appreciable masses on palpation. No skin redness, thickening, or peau d'orange appearance; no nipple retraction or nipple discharge; mild distortion in symmetry at previous lumpectomy site***healed scar without erythema or nodularity.  -Right breast: No appreciable masses on palpation. No skin redness, thickening, or peau d'orange appearance; no nipple retraction or nipple discharge; mild distortion in symmetry at previous lumpectomy site***healed scar without erythema or  nodularity. -Axilla: No axillary adenopathy bilaterally.  GI: Abdomen soft and round; non-tender, non-distended. Bowel sounds normoactive. No hepatosplenomegaly.   GU: Deferred.  Neuro: No focal deficits. Steady gait.  Psych: Mood and affect normal and appropriate for situation.  Extremities: No edema. Skin: Warm and dry.  LABORATORY DATA:  No visits with results within 1 Day(s) from this visit.  Latest known visit with results is:  Nursing Home on 04/17/2016  Component Date Value Ref Range Status  . Glucose 04/16/2016 109  mg/dL Final  . BUN 04/16/2016 8  4 - 21 mg/dL Final  . Creatinine 04/16/2016 0.9  0.5 - 1.1 mg/dL Final  . Potassium 04/16/2016 3.8  3.4 - 5.3 mmol/L Final  . Sodium 04/16/2016 135* 137 - 147 mmol/L Final  . Hemoglobin 04/10/2016 9.2* 12.0 - 16.0 g/dL Final  . HCT 04/10/2016 29* 36 - 46 % Final  . Platelets 04/10/2016 322  150 - 399 K/L Final  . WBC 04/10/2016 11.0  10^3/mL Final  . Glucose 04/10/2016 139  mg/dL Final  . BUN 04/10/2016 10  4 - 21 mg/dL Final  . Creatinine 04/10/2016 0.9  0.5 - 1.1 mg/dL Final  . Potassium 04/10/2016 3.2* 3.4 - 5.3 mmol/L Final  . Sodium 04/10/2016 139  137 - 147 mmol/L Final  . TSH 04/10/2016 10.93* 0.41 - 5.90 uIU/mL Final     DIAGNOSTIC IMAGING:  Most recent mammogram:       ASSESSMENT AND PLAN:  Ms.. [name] is a pleasant 61 y.o. 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  She presents to the Survivorship Clinic for surveillance and routine follow-up.   1. History of breast cancer:  [name] is currently clinically and radiographically without evidence of disease or recurrence of breast cancer. She will be due for mammogram in ***; orders placed today.  She will continue her anti-estrogen  therapy with ***, with plans to continue for *** years.  She will return to the cancer center to see her medical oncologist, Dr. ***, in ***/2018.  I encouraged her to call me with any questions or concerns before her next visit at the cancer center, and I would be  happy to see her sooner, if needed.    #. Problem(s) at Visit___________________.  #. Bone health:  Given Ms. Dargan's age, history of breast cancer, and previous anti-estrogen therapy with Anastrozole and Letrozole for two years, she is at risk for bone demineralization. Her last DEXA scan was on 12/08/2014 and was normal.  This is followed by her PCP.  She was encouraged to continue with her consumption of foods rich in calcium, as well as perform weight-bearing activities.  She was given education on specific food and activities to promote bone health.  #. Cancer screening:  Due to Ms. Dargan's history and her age, she should receive screening for skin cancers, colon cancer, and ynecologic cancers. She was encouraged to follow-up with her PCP for appropriate cancer screenings.   #. Health maintenance and wellness promotion: [name] was encouraged to consume 5-7 servings of fruits and vegetables per day. She was also encouraged to engage in moderate to vigorous exercise for 30 minutes per day most days of the week. She was instructed to limit her alcohol consumption and continue to abstain from tobacco use.    Dispo:  -Return to cancer center ***   A total of (#) minutes of face-to-face time was spent with this patient with greater than 50% of that time in counseling and care-coordination.   Charlestine Massed, NP [facility] [place] [phone]   Note: PRIMARY CARE PROVIDER [name], [place] [phone]

## 2016-04-21 ENCOUNTER — Other Ambulatory Visit: Payer: Self-pay

## 2016-04-21 ENCOUNTER — Encounter: Payer: Self-pay | Admitting: Adult Health

## 2016-04-22 ENCOUNTER — Other Ambulatory Visit: Payer: Self-pay

## 2016-04-22 ENCOUNTER — Ambulatory Visit: Payer: Self-pay | Admitting: Nurse Practitioner

## 2016-04-23 ENCOUNTER — Inpatient Hospital Stay (HOSPITAL_COMMUNITY)
Admission: EM | Admit: 2016-04-23 | Discharge: 2016-04-25 | DRG: 092 | Disposition: A | Discharge status: Skilled Nursing Facility | Payer: Medicare Other | Attending: Internal Medicine | Admitting: Internal Medicine

## 2016-04-23 ENCOUNTER — Emergency Department (HOSPITAL_COMMUNITY): Payer: Medicare Other

## 2016-04-23 DIAGNOSIS — G4733 Obstructive sleep apnea (adult) (pediatric): Secondary | ICD-10-CM | POA: Diagnosis present

## 2016-04-23 DIAGNOSIS — K449 Diaphragmatic hernia without obstruction or gangrene: Secondary | ICD-10-CM | POA: Diagnosis not present

## 2016-04-23 DIAGNOSIS — Z803 Family history of malignant neoplasm of breast: Secondary | ICD-10-CM

## 2016-04-23 DIAGNOSIS — Z90710 Acquired absence of both cervix and uterus: Secondary | ICD-10-CM

## 2016-04-23 DIAGNOSIS — I129 Hypertensive chronic kidney disease with stage 1 through stage 4 chronic kidney disease, or unspecified chronic kidney disease: Secondary | ICD-10-CM | POA: Diagnosis present

## 2016-04-23 DIAGNOSIS — M6281 Muscle weakness (generalized): Secondary | ICD-10-CM | POA: Diagnosis not present

## 2016-04-23 DIAGNOSIS — Z853 Personal history of malignant neoplasm of breast: Secondary | ICD-10-CM

## 2016-04-23 DIAGNOSIS — N179 Acute kidney failure, unspecified: Secondary | ICD-10-CM | POA: Diagnosis not present

## 2016-04-23 DIAGNOSIS — M545 Low back pain: Secondary | ICD-10-CM | POA: Diagnosis present

## 2016-04-23 DIAGNOSIS — Z6841 Body Mass Index (BMI) 40.0 and over, adult: Secondary | ICD-10-CM | POA: Diagnosis not present

## 2016-04-23 DIAGNOSIS — F419 Anxiety disorder, unspecified: Secondary | ICD-10-CM | POA: Diagnosis present

## 2016-04-23 DIAGNOSIS — J4530 Mild persistent asthma, uncomplicated: Secondary | ICD-10-CM | POA: Diagnosis present

## 2016-04-23 DIAGNOSIS — E871 Hypo-osmolality and hyponatremia: Secondary | ICD-10-CM | POA: Diagnosis present

## 2016-04-23 DIAGNOSIS — F200 Paranoid schizophrenia: Secondary | ICD-10-CM | POA: Diagnosis not present

## 2016-04-23 DIAGNOSIS — E1140 Type 2 diabetes mellitus with diabetic neuropathy, unspecified: Secondary | ICD-10-CM | POA: Diagnosis not present

## 2016-04-23 DIAGNOSIS — I6789 Other cerebrovascular disease: Secondary | ICD-10-CM | POA: Diagnosis not present

## 2016-04-23 DIAGNOSIS — R531 Weakness: Secondary | ICD-10-CM | POA: Diagnosis not present

## 2016-04-23 DIAGNOSIS — E876 Hypokalemia: Secondary | ICD-10-CM | POA: Diagnosis not present

## 2016-04-23 DIAGNOSIS — G9340 Encephalopathy, unspecified: Secondary | ICD-10-CM | POA: Diagnosis present

## 2016-04-23 DIAGNOSIS — Z9989 Dependence on other enabling machines and devices: Secondary | ICD-10-CM | POA: Diagnosis not present

## 2016-04-23 DIAGNOSIS — N183 Chronic kidney disease, stage 3 (moderate): Secondary | ICD-10-CM | POA: Diagnosis present

## 2016-04-23 DIAGNOSIS — G92 Toxic encephalopathy: Secondary | ICD-10-CM | POA: Diagnosis not present

## 2016-04-23 DIAGNOSIS — E1142 Type 2 diabetes mellitus with diabetic polyneuropathy: Secondary | ICD-10-CM | POA: Diagnosis present

## 2016-04-23 DIAGNOSIS — F209 Schizophrenia, unspecified: Secondary | ICD-10-CM | POA: Diagnosis not present

## 2016-04-23 DIAGNOSIS — Z833 Family history of diabetes mellitus: Secondary | ICD-10-CM

## 2016-04-23 DIAGNOSIS — Z91030 Bee allergy status: Secondary | ICD-10-CM

## 2016-04-23 DIAGNOSIS — Z8249 Family history of ischemic heart disease and other diseases of the circulatory system: Secondary | ICD-10-CM

## 2016-04-23 DIAGNOSIS — Z79891 Long term (current) use of opiate analgesic: Secondary | ICD-10-CM | POA: Diagnosis not present

## 2016-04-23 DIAGNOSIS — Z923 Personal history of irradiation: Secondary | ICD-10-CM

## 2016-04-23 DIAGNOSIS — R4182 Altered mental status, unspecified: Secondary | ICD-10-CM

## 2016-04-23 DIAGNOSIS — F319 Bipolar disorder, unspecified: Secondary | ICD-10-CM | POA: Diagnosis present

## 2016-04-23 DIAGNOSIS — G8929 Other chronic pain: Secondary | ICD-10-CM | POA: Diagnosis present

## 2016-04-23 DIAGNOSIS — R488 Other symbolic dysfunctions: Secondary | ICD-10-CM | POA: Diagnosis not present

## 2016-04-23 DIAGNOSIS — Z96653 Presence of artificial knee joint, bilateral: Secondary | ICD-10-CM | POA: Diagnosis present

## 2016-04-23 DIAGNOSIS — K219 Gastro-esophageal reflux disease without esophagitis: Secondary | ICD-10-CM | POA: Diagnosis present

## 2016-04-23 DIAGNOSIS — Z87891 Personal history of nicotine dependence: Secondary | ICD-10-CM | POA: Diagnosis not present

## 2016-04-23 DIAGNOSIS — E785 Hyperlipidemia, unspecified: Secondary | ICD-10-CM | POA: Diagnosis not present

## 2016-04-23 DIAGNOSIS — E039 Hypothyroidism, unspecified: Secondary | ICD-10-CM | POA: Diagnosis present

## 2016-04-23 DIAGNOSIS — Z79899 Other long term (current) drug therapy: Secondary | ICD-10-CM

## 2016-04-23 DIAGNOSIS — E1122 Type 2 diabetes mellitus with diabetic chronic kidney disease: Secondary | ICD-10-CM | POA: Diagnosis present

## 2016-04-23 DIAGNOSIS — Z794 Long term (current) use of insulin: Secondary | ICD-10-CM

## 2016-04-23 DIAGNOSIS — E119 Type 2 diabetes mellitus without complications: Secondary | ICD-10-CM

## 2016-04-23 DIAGNOSIS — Z9049 Acquired absence of other specified parts of digestive tract: Secondary | ICD-10-CM

## 2016-04-23 DIAGNOSIS — Z888 Allergy status to other drugs, medicaments and biological substances status: Secondary | ICD-10-CM

## 2016-04-23 DIAGNOSIS — Z91013 Allergy to seafood: Secondary | ICD-10-CM

## 2016-04-23 DIAGNOSIS — G894 Chronic pain syndrome: Secondary | ICD-10-CM | POA: Diagnosis present

## 2016-04-23 DIAGNOSIS — B009 Herpesviral infection, unspecified: Secondary | ICD-10-CM | POA: Diagnosis present

## 2016-04-23 DIAGNOSIS — G219 Secondary parkinsonism, unspecified: Secondary | ICD-10-CM | POA: Diagnosis present

## 2016-04-23 DIAGNOSIS — R2681 Unsteadiness on feet: Secondary | ICD-10-CM | POA: Diagnosis not present

## 2016-04-23 DIAGNOSIS — R4781 Slurred speech: Secondary | ICD-10-CM | POA: Diagnosis not present

## 2016-04-23 LAB — RAPID URINE DRUG SCREEN, HOSP PERFORMED
AMPHETAMINES: NOT DETECTED
Barbiturates: NOT DETECTED
Benzodiazepines: POSITIVE — AB
Cocaine: NOT DETECTED
OPIATES: POSITIVE — AB
Tetrahydrocannabinol: NOT DETECTED

## 2016-04-23 LAB — COMPREHENSIVE METABOLIC PANEL
ALT: 17 U/L (ref 14–54)
AST: 19 U/L (ref 15–41)
Albumin: 3.3 g/dL — ABNORMAL LOW (ref 3.5–5.0)
Alkaline Phosphatase: 60 U/L (ref 38–126)
Anion gap: 13 (ref 5–15)
BUN: 28 mg/dL — ABNORMAL HIGH (ref 6–20)
CO2: 22 mmol/L (ref 22–32)
Calcium: 8.7 mg/dL — ABNORMAL LOW (ref 8.9–10.3)
Chloride: 89 mmol/L — ABNORMAL LOW (ref 101–111)
Creatinine, Ser: 2.02 mg/dL — ABNORMAL HIGH (ref 0.44–1.00)
GFR calc Af Amer: 30 mL/min — ABNORMAL LOW (ref >60)
GFR calc non Af Amer: 26 mL/min — ABNORMAL LOW (ref >60)
Glucose, Bld: 113 mg/dL — ABNORMAL HIGH (ref 65–99)
Potassium: 3.6 mmol/L (ref 3.5–5.1)
Sodium: 124 mmol/L — ABNORMAL LOW (ref 135–145)
Total Bilirubin: 0.6 mg/dL (ref 0.3–1.2)
Total Protein: 6.1 g/dL — ABNORMAL LOW (ref 6.5–8.1)

## 2016-04-23 LAB — I-STAT CHEM 8, ED
BUN: 27 mg/dL — ABNORMAL HIGH (ref 6–20)
Calcium, Ion: 1.07 mmol/L — ABNORMAL LOW (ref 1.15–1.40)
Chloride: 89 mmol/L — ABNORMAL LOW (ref 101–111)
Creatinine, Ser: 2.1 mg/dL — ABNORMAL HIGH (ref 0.44–1.00)
Glucose, Bld: 112 mg/dL — ABNORMAL HIGH (ref 65–99)
HCT: 31 % — ABNORMAL LOW (ref 36.0–46.0)
Hemoglobin: 10.5 g/dL — ABNORMAL LOW (ref 12.0–15.0)
Potassium: 3.7 mmol/L (ref 3.5–5.1)
Sodium: 124 mmol/L — ABNORMAL LOW (ref 135–145)
TCO2: 24 mmol/L (ref 0–100)

## 2016-04-23 LAB — URINALYSIS, ROUTINE W REFLEX MICROSCOPIC
BILIRUBIN URINE: NEGATIVE
GLUCOSE, UA: NEGATIVE mg/dL
KETONES UR: NEGATIVE mg/dL
Nitrite: NEGATIVE
PH: 5 (ref 5.0–8.0)
Protein, ur: NEGATIVE mg/dL
Specific Gravity, Urine: 1.005 (ref 1.005–1.030)

## 2016-04-23 LAB — DIFFERENTIAL
Basophils Absolute: 0 10*3/uL (ref 0.0–0.1)
Basophils Relative: 0 %
Eosinophils Absolute: 0.6 10*3/uL (ref 0.0–0.7)
Eosinophils Relative: 5 %
Lymphocytes Relative: 24 %
Lymphs Abs: 2.6 10*3/uL (ref 0.7–4.0)
Monocytes Absolute: 0.7 10*3/uL (ref 0.1–1.0)
Monocytes Relative: 7 %
Neutro Abs: 6.9 10*3/uL (ref 1.7–7.7)
Neutrophils Relative %: 64 %

## 2016-04-23 LAB — CBC
HCT: 29.6 % — ABNORMAL LOW (ref 36.0–46.0)
Hemoglobin: 9.6 g/dL — ABNORMAL LOW (ref 12.0–15.0)
MCH: 26 pg (ref 26.0–34.0)
MCHC: 32.4 g/dL (ref 30.0–36.0)
MCV: 80.2 fL (ref 78.0–100.0)
Platelets: 301 10*3/uL (ref 150–400)
RBC: 3.69 MIL/uL — ABNORMAL LOW (ref 3.87–5.11)
RDW: 16.9 % — ABNORMAL HIGH (ref 11.5–15.5)
WBC: 10.9 10*3/uL — ABNORMAL HIGH (ref 4.0–10.5)

## 2016-04-23 LAB — I-STAT TROPONIN, ED: Troponin i, poc: 0 ng/mL (ref 0.00–0.08)

## 2016-04-23 LAB — ETHANOL: Alcohol, Ethyl (B): <5 mg/dL (ref <5)

## 2016-04-23 LAB — PROTIME-INR
INR: 1.06
Prothrombin Time: 13.9 seconds (ref 11.4–15.2)

## 2016-04-23 LAB — APTT: aPTT: 34 seconds (ref 24–36)

## 2016-04-23 IMAGING — CR DG CHEST 1V PORT
1 series · 1 of 1 positions shown · non-contrast
Comparison: 08/14/2014

CLINICAL DATA: Shortness of breath and chest pain.

EXAM:
PORTABLE CHEST - 1 VIEW

[AP]
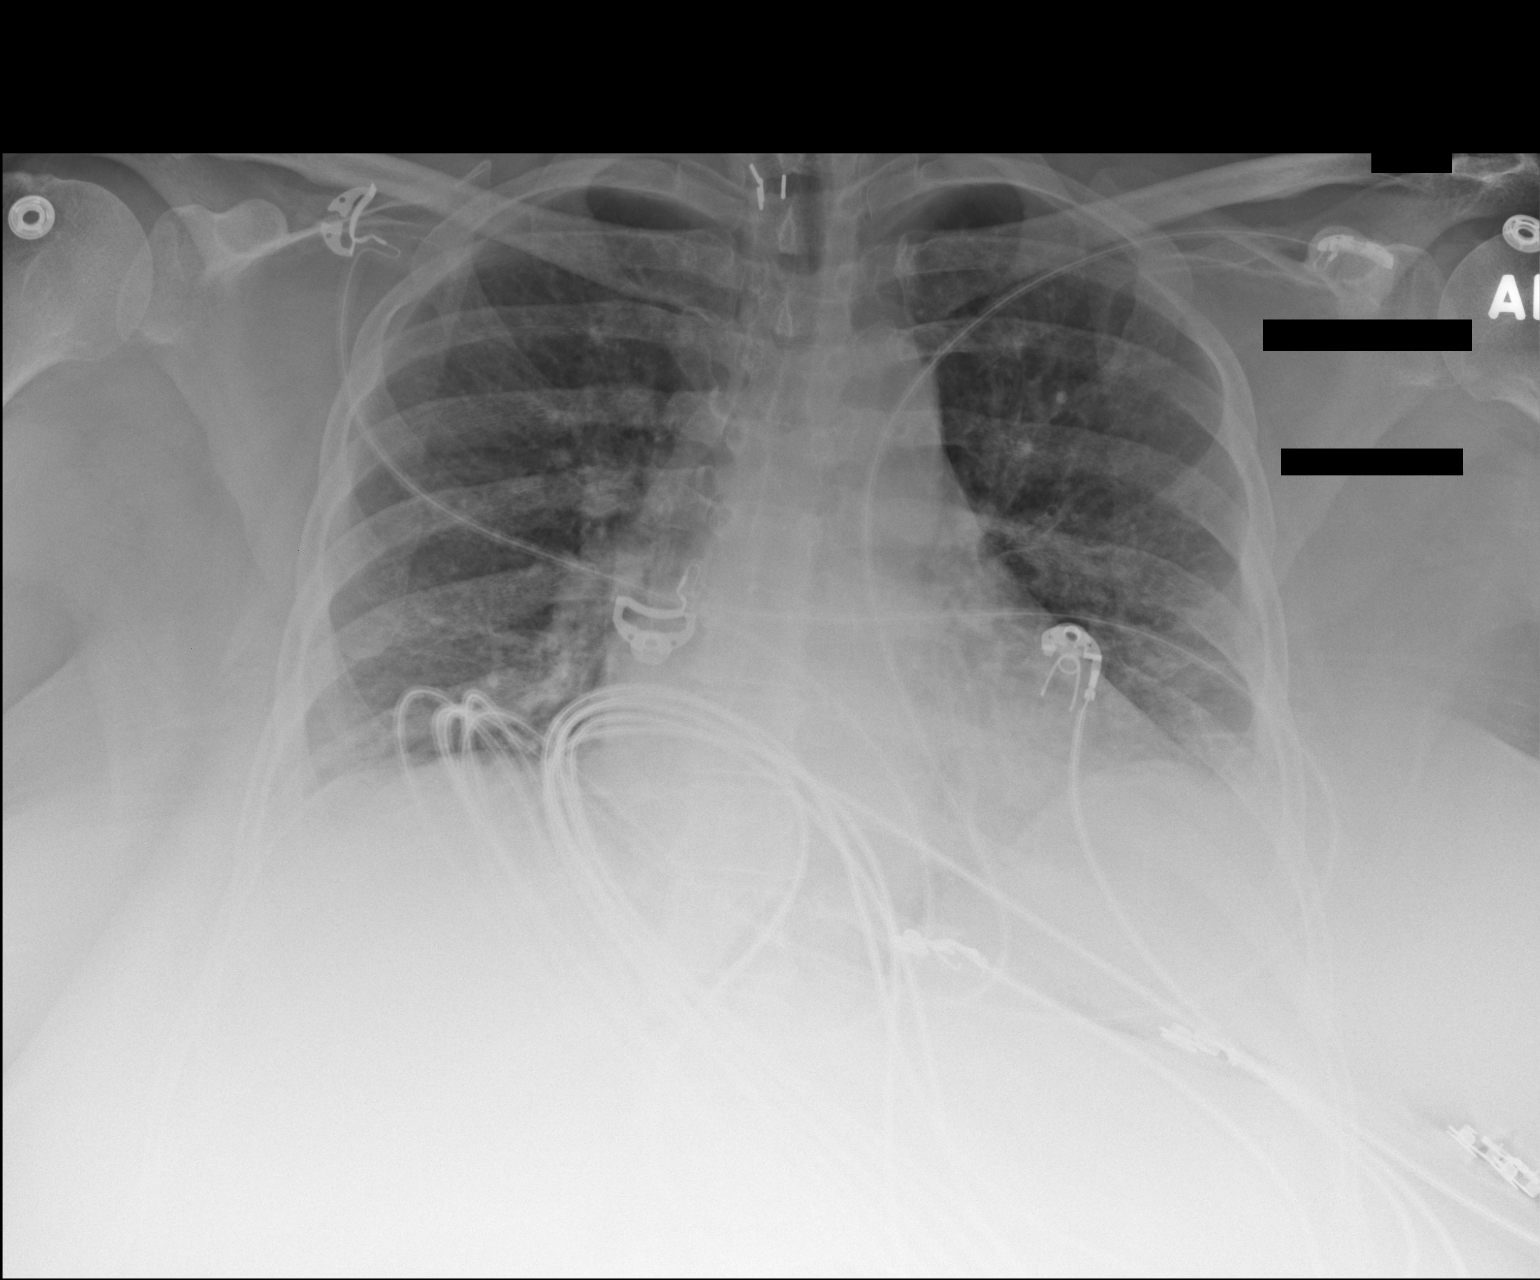

[1 of 1 positions shown; findings below may reference images not displayed]

FINDINGS: The heart size and mediastinal contours are within normal limits.
Both lungs are clear. The visualized skeletal structures are
unremarkable.
IMPRESSION: No active disease.

## 2016-04-23 MED ORDER — NALOXONE HCL 0.4 MG/ML IJ SOLN
0.4000 mg | Freq: Once | INTRAMUSCULAR | Status: AC
  Administered 2016-04-23: 0.4 mg via INTRAVENOUS
  Filled 2016-04-23: qty 1

## 2016-04-23 MED ORDER — ONDANSETRON HCL 4 MG/2ML IJ SOLN: 4.0000 mg | Freq: Four times a day (QID) | INTRAMUSCULAR | Status: DC | PRN

## 2016-04-23 MED ORDER — ACETAMINOPHEN 325 MG PO TABS
650.0000 mg | ORAL_TABLET | Freq: Four times a day (QID) | ORAL | Status: DC | PRN
  Administered 2016-04-24 – 2016-04-25 (×2): 650 mg via ORAL
  Filled 2016-04-23 (×2): qty 2

## 2016-04-23 MED ORDER — ALBUTEROL SULFATE (2.5 MG/3ML) 0.083% IN NEBU: 2.5000 mg | INHALATION_SOLUTION | RESPIRATORY_TRACT | Status: DC | PRN

## 2016-04-23 MED ORDER — INSULIN ASPART 100 UNIT/ML ~~LOC~~ SOLN: 0.0000 [IU] | Freq: Three times a day (TID) | SUBCUTANEOUS | Status: DC

## 2016-04-23 MED ORDER — SODIUM CHLORIDE 0.9% FLUSH
3.0000 mL | Freq: Two times a day (BID) | INTRAVENOUS | Status: DC
  Administered 2016-04-24 – 2016-04-25 (×2): 3 mL via INTRAVENOUS

## 2016-04-23 MED ORDER — SODIUM CHLORIDE 0.9 % IV BOLUS (SEPSIS)
1000.0000 mL | Freq: Once | INTRAVENOUS | Status: AC
  Administered 2016-04-23: 1000 mL via INTRAVENOUS

## 2016-04-23 MED ORDER — ENOXAPARIN SODIUM 40 MG/0.4ML ~~LOC~~ SOLN
40.0000 mg | Freq: Every day | SUBCUTANEOUS | Status: DC
  Administered 2016-04-24 – 2016-04-25 (×2): 40 mg via SUBCUTANEOUS
  Filled 2016-04-23 (×2): qty 0.4

## 2016-04-23 MED ORDER — SODIUM CHLORIDE 0.9 % IV SOLN
INTRAVENOUS | Status: DC
  Administered 2016-04-23 – 2016-04-24 (×2): via INTRAVENOUS
  Administered 2016-04-24: 50 mL/h via INTRAVENOUS

## 2016-04-23 MED ORDER — ACETAMINOPHEN 650 MG RE SUPP: 650.0000 mg | Freq: Four times a day (QID) | RECTAL | Status: DC | PRN

## 2016-04-23 MED ORDER — ONDANSETRON HCL 4 MG PO TABS: 4.0000 mg | ORAL_TABLET | Freq: Four times a day (QID) | ORAL | Status: DC | PRN

## 2016-04-23 NOTE — ED Notes (Signed)
EKG given to Dr. Wilson Singer

## 2016-04-23 NOTE — H&P (Addendum)
HISTORY AND PHYSICAL       PATIENT DETAILS Name: [name] Age: 61 y.o. Sex: female Date of Birth: 1955/09/03 Admit Date: 04/23/2016 [facility] Tammie Dapper, MD  Patient coming from: Home   CHIEF COMPLAINT:  Altered mental status  HPI: [name] is a 61 y.o. female with medical history significant of type 2 diabetes, or full neuropathy, chronic back pain with chronic pain syndrome on narcotics-recently admitted (2/8-2/19) for presumed altered mental status due to polypharmacy-brought into the ED for evaluation of altered mental status. Please note, although patient slowly improving, and able to provide some history, history is limited due to AMS. Straight is obtained from reviewing ED chart, and speaking with the patient's son at bedside.  Patient was discharged on 2/19 from this facility, and was sent to SNF. She was just released from SNF this past Saturday and is currently living at home. She manages all her medications herself.  Per patient's son, patient was in her usual state of health over the past few days, yesterday when he talked to her on the phone, she sounded very sluggish and drowsy. He urged her to come to the emergency room, but she adamantly refused. This morning, patient's home health aide came to her house, who found the very confused, and called EMS. Patient was subsequently brought to the emergency room, where she was found to be very lethargic and somewhat confused. Given prior history of polypharmacy, she was empirically given Narcan without any significant clinical response. I was subsequently called to admit this patient for further evaluation and treatment.  Although still lethargic and somewhat somnolent, patient seems to be slowly improving, she denied any fever, headache, nausea, vomiting or diarrhea. She denies any abdominal pain. She acknowledged that her last meal was dinner yesterday evening. Unable to get any further history at this time,  as patient continues to doze off.  ED Course:  She was found to have acute knee injury and hyponatremia. Because of slurred speech, there was some concern for CVA, hence neurology was consulted. CT had was negative for acute abnormalities. Neurology thought that her altered mental status was secondary to polypharmacy, and has subsequently signed off.  Note: Lives at: Home Mobility: Independent Chronic Indwelling Foley:no   REVIEW OF SYSTEMS: obtained after speaking with patient and also with patient's son at bedside Constitutional:   No  weight loss, night sweats,  Fevers  HEENT:    No headaches, Dysphagia  Cardio-vascular: No chest pain,Orthopnea, PND,lower extremity edema, anasarca  GI:  No heartburn, indigestion, abdominal pain, nausea, vomiting, diarrhea, melena or hematochezia  Resp: No shortness of breath, cough,plueritic chest pain.   Skin:  No rash or lesions.  GU:  No dysuria, change in color of urine, no urgency or frequency.    Musculoskeletal: No joint pain or swelling.    Endocrine: No heat intolerance, no cold intolerance, no polyuria, no polydipsia  Psych: No change in mood or affect.     ALLERGIES:   Allergies  Allergen Reactions  . Bee Venom Anaphylaxis  . Shellfish Allergy Anaphylaxis  . Buprenorphine Hcl Other (See Comments)    Overly sedated    PAST MEDICAL HISTORY: Past Medical History:  Diagnosis Date  . Anemia   . Anxiety   . Arthritis    "pretty much all my joints"  . Asthma   . Benign paroxysmal positional vertigo 12/30/2012  . Breast cancer ([place]) 02/13/12   ruq  100'clock bx Ductal  Carcinoma in Situ,(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  . Breast mass in female    L breast 2008, US showed likely fat necrosis  . Chronic lower back pain   . CKD (chronic kidney disease), stage III    "lower stage" (01/06/2014)  . Daily headache   . Depression   . GERD (gastroesophageal reflux disease)   . History of blood transfusion 2011   "after one of  my OR's"  . History of hiatal hernia   . History of stomach ulcers   . HSV (herpes simplex virus) infection   . Hx of radiation therapy 05/05/12- 07/15/12   right breast, 45 gray x 25 fx, lumpectomy cavity boosted to 16.2 gray  . Hyperlipemia   . Hypertension    sees Dr. Criss Rosales , [facility]  . Hypothyroidism   . Kidney stones   . Knee pain, bilateral   . Migraines    "~ 3 times/month" (01/06/2014)  . Obesity   . OSA on CPAP    pt does not know settings  . Pancreatitis    hx of  . Pneumonia 1950's  . Polyneuropathy in diabetes(357.2)   . Schizophrenia ([place])   . 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 Tachycardia    with sx in 2008  . Thyroid disease   . Type II diabetes mellitus ([place])     PAST SURGICAL HISTORY: Past Surgical History:  Procedure Laterality Date  . ABDOMINAL HYSTERECTOMY  1979?   partial  . BREAST LUMPECTOMY  03/03/2012   Procedure: LUMPECTOMY;  Surgeon: Haywood Lasso, MD;  Location: [facility];  Service: General;  Laterality: Right;  . BREAST SURGERY Right 01/2012   "cancer"  . CHOLECYSTECTOMY  1980's?  . COLONOSCOPY WITH PROPOFOL N/A 09/28/2012   Procedure: COLONOSCOPY WITH PROPOFOL;  Surgeon: Lear Ng, MD;  Location: [facility];  Service: Endoscopy;  Laterality: N/A;  . FOOT FRACTURE SURGERY Right 1990's  . JOINT REPLACEMENT    . REVISION TOTAL KNEE ARTHROPLASTY  07/2009  . SHOULDER OPEN ROTATOR CUFF REPAIR Left 1990's  . THYROIDECTOMY  1970's  . TOTAL KNEE ARTHROPLASTY Bilateral 2009-06/2009   left; right    MEDICATIONS AT HOME: Prior to Admission medications   Medication Sig Start Date End Date Taking? Authorizing Provider  carvedilol (COREG) 3.125 MG tablet Take 1 tablet (3.125 mg total) by mouth 2 (two) times daily with a meal. 02/17/16  Yes Florencia Reasons, MD  docusate sodium (COLACE) 50 MG capsule Take 100 mg by mouth at bedtime.   Yes Historical Provider, MD  doxepin (SINEQUAN) 25 MG capsule Take 25-50 mg by mouth at bedtime as  needed. For insomnia   Yes Historical Provider, MD  esomeprazole (NEXIUM) 40 MG capsule Take 1 capsule (40 mg total) by mouth daily before breakfast. Patient taking differently: Take 40 mg by mouth 2 (two) times daily.  10/30/14  Yes Barton Dubois, MD  furosemide (LASIX) 40 MG tablet Take 1 tablet (40 mg total) by mouth daily. 02/17/16  Yes Florencia Reasons, MD  gabapentin (NEURONTIN) 100 MG capsule Take 100 mg by mouth 3 (three) times daily.   Yes Historical Provider, MD  levothyroxine (SYNTHROID, LEVOTHROID) 150 MCG tablet Take 150 mcg by mouth daily before breakfast.   Yes Historical Provider, MD  LORazepam (ATIVAN) 2 MG tablet Take 0.5-2 mg by mouth See admin instructions. Take one-half tablet in the morning and one tablet by mouth nightly as needed for insomnia   Yes Historical Provider, MD  oxyCODONE (OXY IR/ROXICODONE) 5 MG  immediate release tablet Take 5 mg by mouth every 4 (four) hours as needed for moderate pain or severe pain.   Yes Historical Provider, MD  oxyCODONE (ROXICODONE) 15 MG immediate release tablet Take 15 mg by mouth See admin instructions. Take one tablet by mouth up to 6 times daily as needed for pain   Yes Historical Provider, MD  pregabalin (LYRICA) 150 MG capsule Take 150 mg by mouth 2 (two) times daily.   Yes Historical Provider, MD  Tapentadol HCl (NUCYNTA ER) 250 MG TB12 Take 1 tablet by mouth every 12 (twelve) hours.   Yes Historical Provider, MD  valACYclovir (VALTREX) 1000 MG tablet Take 1,000 mg by mouth every 12 (twelve) hours.   Yes Historical Provider, MD  valsartan (DIOVAN) 40 MG tablet Take 40 mg by mouth daily.   Yes Historical Provider, MD  valsartan-hydrochlorothiazide (DIOVAN-HCT) 160-12.5 MG tablet Take 1 tablet by mouth daily.   Yes Historical Provider, MD  venlafaxine XR (EFFEXOR-XR) 150 MG 24 hr capsule Take 150 mg by mouth 2 (two) times daily. 05/21/15  Yes Historical Provider, MD  ziprasidone (GEODON) 40 MG capsule Take 40 mg by mouth daily.   Yes Historical  Provider, MD  acetaminophen (TYLENOL) 500 MG tablet Take 500 mg by mouth every 6 (six) hours. Scheduled until 3/8 and then starting 3/9 q6h PRN 04/09/16   Historical Provider, MD  albuterol (PROAIR HFA) 108 (90 Base) MCG/ACT inhaler Inhale 2 puffs into the lungs every 4 (four) hours as needed for wheezing or shortness of breath.     Historical Provider, MD  albuterol (PROVENTIL) (2.5 MG/3ML) 0.083% nebulizer solution Inhale 3 mLs into the lungs every 6 (six) hours as needed. For shortness of breath 11/03/15   Historical Provider, MD  aspirin EC 81 MG tablet Take 1 tablet (81 mg total) by mouth daily. 08/12/13   Sheila Oats, MD  B Complex-C (B-COMPLEX WITH VITAMIN C) tablet Take 1 tablet by mouth daily.    Historical Provider, MD  Cholecalciferol (VITAMIN D-3) 1000 units CAPS Take 1,000 Units by mouth daily.    Historical Provider, MD  diclofenac sodium (VOLTAREN) 1 % GEL Apply 2-4 g topically every 8 (eight) hours. Apply 2 gm to right wrist and 4 mg to both knees x2 weeks 04/09/16 04/24/16  Historical Provider, MD  ferrous sulfate 325 (65 FE) MG EC tablet Take 1 tablet (325 mg total) by mouth daily with breakfast. 02/17/16   Florencia Reasons, MD  Insulin Detemir (LEVEMIR FLEXTOUCH) 100 UNIT/ML Pen Inject 10 Units into the skin at bedtime.     Historical Provider, MD  levothyroxine (SYNTHROID, LEVOTHROID) 175 MCG tablet Take 175 mcg by mouth daily before breakfast.    Historical Provider, MD  magnesium oxide (MAG-OX) 400 MG tablet Take 400 mg by mouth daily.    Historical Provider, MD  Menthol, Topical Analgesic, (ICY HOT) 16 % LIQD Apply 1 application topically every 8 (eight) hours. Apply to back q8h x2 weeks 04/10/16 04/24/16  Historical Provider, MD  Multiple Vitamins-Minerals (ONE-A-DAY WOMENS 50+ ADVANTAGE) TABS Take 1 tablet by mouth daily.    Historical Provider, MD  oxyCODONE (ROXICODONE) 5 MG immediate release tablet Take 1 tablet (5 mg total) by mouth every 8 (eight) hours as needed for severe pain. >7/10  04/09/16   Gildardo Cranker, DO  potassium chloride (K-DUR) 10 MEQ tablet Take 10 mEq by mouth daily.    Historical Provider, MD  rosuvastatin (CRESTOR) 20 MG tablet Take 20 mg by mouth daily.  Historical Provider, MD  sitaGLIPtin-metformin (JANUMET) 50-500 MG per tablet Take 1 tablet by mouth 2 (two) times daily with a meal.     Historical Provider, MD    FAMILY HISTORY: Family History  Problem Relation Age of Onset  . Heart disease Brother     Multiple MIs, starting in his 21s  . Diabetes Brother   . Hypertension Mother   . Diabetes Mother   . Breast cancer Mother 27  . Bone cancer Mother   . Hypertension Sister   . Diabetes Sister   . Breast cancer Sister 41  . Breast cancer Maternal Grandmother   . Heart disease Maternal Grandmother   . Uterine cancer Other 19  . Breast cancer Paternal Aunt 8  . Breast cancer Paternal Grandmother     dx in her 41s  . Prostate cancer Paternal Grandfather     SOCIAL HISTORY:  reports that she quit smoking about 24 years ago. Her smoking use included Cigarettes. She has a 7.00 pack-year smoking history. She has never used smokeless tobacco. She reports that she does not drink alcohol or use drugs.  PHYSICAL EXAM: Temperature 98 F (36.7 C), temperature source Oral.  General appearance :Lethargic, but easily arousable with verbal stimuli. Answers in 3-4 word sentences. Repeatedly goes back to sleep during my evaluation. She does not appear to be in any distress. Eyes:, pupils equally reactive to light and accomodation,no scleral icterus. HEENT: Atraumatic and Normocephalic Neck: supple, no JVD. No cervical lymphadenopathy.  Resp:Good air entry bilaterally CVS: S1 S2 regular, no murmurs.  GI: Bowel sounds present, Non tender and not distended with no gaurding, rigidity or rebound.No organomegaly Extremities: B/L Lower Ext shows no edema, both legs are warm to touch Neurology:  speech clear,Non focal, sensation is grossly  intact. Musculoskeletal:gait appears to be normal.No digital cyanosis Skin:No Rash, warm and dry Wounds:N/A  LABS ON ADMISSION:  I have personally reviewed following labs and imaging studies  CBC:  Recent Labs Lab 04/23/16 1429 04/23/16 1450  WBC 10.9*  --   NEUTROABS 6.9  --   HGB 9.6* 10.5*  HCT 29.6* 31.0*  MCV 80.2  --   PLT 301  --     Basic Metabolic Panel:  Recent Labs Lab 04/23/16 1429 04/23/16 1450  NA 124* 124*  K 3.6 3.7  CL 89* 89*  CO2 22  --   GLUCOSE 113* 112*  BUN 28* 27*  CREATININE 2.02* 2.10*  CALCIUM 8.7*  --     GFR: Estimated Creatinine Clearance: 43.2 mL/min (by C-G formula based on SCr of 2.1 mg/dL (H)).  Liver Function Tests:  Recent Labs Lab 04/23/16 1429  AST 19  ALT 17  ALKPHOS 60  BILITOT 0.6  PROT 6.1*  ALBUMIN 3.3*   No results for input(s): LIPASE, AMYLASE in the last 168 hours. No results for input(s): AMMONIA in the last 168 hours.  Coagulation Profile:  Recent Labs Lab 04/23/16 1429  INR 1.06    Cardiac Enzymes: No results for input(s): CKTOTAL, CKMB, CKMBINDEX, TROPONINI in the last 168 hours.  BNP (last 3 results) No results for input(s): PROBNP in the last 8760 hours.  HbA1C: No results for input(s): HGBA1C in the last 72 hours.  CBG: No results for input(s): GLUCAP in the last 168 hours.  Lipid Profile: No results for input(s): CHOL, HDL, LDLCALC, TRIG, CHOLHDL, LDLDIRECT in the last 72 hours.  Thyroid Function Tests: No results for input(s): TSH, T4TOTAL, FREET4, T3FREE, THYROIDAB in the last 72  hours.  Anemia Panel: No results for input(s): VITAMINB12, FOLATE, FERRITIN, TIBC, IRON, RETICCTPCT in the last 72 hours.  Urine analysis:    Component Value Date/Time   COLORURINE YELLOW 03/27/2016 2020   APPEARANCEUR CLEAR 03/27/2016 2020   APPEARANCEUR Clear 11/13/2013 1310   LABSPEC 1.012 03/27/2016 2020   LABSPEC 1.017 11/13/2013 1310   PHURINE 5.0 03/27/2016 2020   GLUCOSEU NEGATIVE  03/27/2016 2020   GLUCOSEU Negative 11/13/2013 1310   GLUCOSEU NEG mg/dL 03/02/2006 2050   HGBUR NEGATIVE 03/27/2016 2020   BILIRUBINUR NEGATIVE 03/27/2016 2020   BILIRUBINUR Negative 11/13/2013 [place] 03/27/2016 2020   PROTEINUR NEGATIVE 03/27/2016 2020   UROBILINOGEN 0.2 11/12/2014 2057   NITRITE NEGATIVE 03/27/2016 2020   LEUKOCYTESUR NEGATIVE 03/27/2016 2020   LEUKOCYTESUR Trace 11/13/2013 1310    Sepsis Labs: Lactic Acid, Venous    Component Value Date/Time   LATICACIDVEN 0.9 03/28/2016 0430     Microbiology: No results found for this or any previous visit (from the past 240 hour(s)).    RADIOLOGIC STUDIES ON ADMISSION: Ct Head Code Stroke W/o Cm  Result Date: 04/23/2016 CLINICAL DATA:  Code stroke. Acute onset of slurred speech and weakness. EXAM: CT HEAD WITHOUT CONTRAST TECHNIQUE: Contiguous axial images were obtained from the base of the skull through the vertex without intravenous contrast. COMPARISON:  CT head without contrast 04/01/2016 FINDINGS: Brain: No acute infarct, hemorrhage, or mass lesion is present. The ventricles are of normal size. No significant extraaxial fluid collection is present. No significant white matter disease is present. The brainstem and cerebellum are within normal limits. Vascular: No hyperdense vessel or unexpected calcification. Skull: The calvarium is within normal limits. No focal lytic or blastic lesions are present. Sinuses/Orbits: The paranasal sinuses are clear. Debris in the external auditory canals is again noted. Go ASPECTS [facility] Stroke Program Early CT Score) - Ganglionic level infarction (caudate, lentiform nuclei, internal capsule, insula, M1-M3 cortex): 7/7 - Supraganglionic infarction (M4-M6 cortex): 3/3 Total score (0-10 with 10 being normal): 10/10 IMPRESSION: 1. Normal CT appearance of the brain. No acute or focal lesion to explain abnormal speech or weakness. 2. ASPECTS is 10/10 Electronically Signed   By:  San Morelle M.D.   On: 04/23/2016 14:20    I have personally reviewed images of CT head  EKG:  Personally reviewed. Sinus rhythm  ASSESSMENT AND PLAN: Present on Admission: Acute toxic Encephalopathy: High suspicion for polypharmacy causing toxic encephalopathy-the family has brought bottles of numerous narcotics, benzodiazepines, and Geodon etc. at bedside. Furthermore, while at SNF recently she was stable and without any encephalopathy-she was just released from her SNF this past Saturday, unfortunately she lives alone and manages all her medications herself. She is slowly improving, easily and responds to vocal stimuli but still drowsy. She is currently able to protect her airway.. For now, hold all narcotics, benzodiazepines that she is much more awake. Monitor in stepdown.  Acute kidney injury: Suspected hemodynamically mediated injury, she looks dry on my exam. Hydrate with IV fluids, recheck electrolytes tomorrow.  Hyponatremia: Likely hypotonic hypovolemic hyponatremia-gently hydrate with IV fluids and recheck electrolytes tomorrow morning. If sodium decreases any further, then we can initiate further workup.  Hypothyroidism: resume Synthroid when more awake and alert.  Hypertension: BP appears soft, hold valsartan and HCTZ for now.  Type 2 diabetes: Place on SSI and follow CBGs. Resume basal insulin when her diet more stable.  Chronic back pain/chronic pain syndrome/diabetic nephropathy: Hold all narcotics and sedative medication  History  of schizophrenia: Hold all anti-psych meds till patient much more awake  Polypharmacy: Will need to discontinue multiple medications over the next few days-have consulted social work-suspect may need SNF placement on discharge.  OSA: Resume CPAP when more awake and alert  Morbid obesity  Further plan will depend as patient's clinical course evolves and further radiologic and laboratory data become available. Patient will be  monitored closely.  Above noted plan was discussed with patient's sone- face to face at bedside, she was in agreement.   CONSULTS: None  DVT Prophylaxis: Prophylactic Lovenox   Code Status: Full Code  Disposition Plan:  Discharge back home-likely to SNF possibly in 2-3 days  Admission status:  Inpatient to  SDU  The patient is critically ill with multiple organ system failure and requires high complexity decision making for assessment and support, frequent evaluation and titration of therapies, advanced monitoring, review of radiographic studies and interpretation of complex data.   Total time spent  55 minutes.Greater than 50% of this time was spent in counseling, explanation of diagnosis, planning of further management, and coordination of care.  [facility] Pager [phone]  If 7PM-7AM, please contact night-coverage [email or web address] Password [facility] 04/23/2016, 5:28 PM

## 2016-04-23 NOTE — ED Triage Notes (Signed)
Pt arrived via EMS after collapsing to floor at home. per EMS: Pt was at home with home health assistant when she began feeling weak while ambulating to bed. Pt collapsed to floor, assistant called 911. Pt arrived at ED with snoring respirations of 8 per min. Pt aroused only after EDP used ammonia inhalant but quickly returned to an sleeping state. Vitals with EMS were 110/P, manual, HR: 100. EMS CBG 102.

## 2016-04-23 NOTE — Consult Note (Signed)
Neurology Consult Note  Reason for Consultation: Code stroke  Requesting provider: Activated by EMS; ED MD Virgel Manifold  CC: Patient is obtunded and unable to provide  HPI: History is obtained from EMS who brought the patient to the emergency department. The patient is obtunded and unable to provide any information. No family is present.  This is a 61 year old woman with multiple chronic medical problems who presents to the emergency department as a code stroke. EMS reported that the patient was at home. Her home health aide came to check on her and the patient apparently complained of feeling weak while she was [place]ml[place] to bed. She collapsed to the floor and her aide called 911. EMS reports and the patient had just taken several of her medications, including OxyContin, Ativan, and Neurontin. After they arrived, they report the patient was initially talking but she gradually became less and less responsive over time. They did not appreciate any focal neurologic deficits. By the time she arrived in the emergency department she had snoring respirations at 8/min. She roused with an ammonia inhalant but then quickly went back to sleep. She was taken for emergent CT scan of the head which did not show any acute intracranial abnormalities. On exam, she was attended but would briefly arouse to tactile and verbal stimulation without any focal deficits appreciated. It was felt that her presentation was not likely to be stroke and code stroke was canceled.  EMS brought the patient's medications to the hospital with them. These were into large bags. Among these medications were prescriptions for the following:  Oxycodone 15 mg up to 6 times daily  Zyprexa 40 mg at bedtime  Nucynta ER 250 mg every 12 hours  Gabapentin 300 mg 3 times daily  Lorazepam 0.5 mg each morning and 1 mg at bedtime as needed  Doxepin 25-50 milligrams at bedtime  Lyrica 150 mg twice daily  Venlafaxine 150 mg twice daily  It is  not clear exactly which of these medications or how much she may have taken today are to EMS arrival.  PMH:  Past Medical History:  Diagnosis Date  . Anemia   . Anxiety   . Arthritis    "pretty much all my joints"  . Asthma   . Benign paroxysmal positional vertigo 12/30/2012  . Breast cancer ([place]) 02/13/12   ruq  100'clock bx Ductal Carcinoma in Situ,(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  . Breast mass in female    L breast 2008, US showed likely fat necrosis  . Chronic lower back pain   . CKD (chronic kidney disease), stage III    "lower stage" (01/06/2014)  . Daily headache   . Depression   . GERD (gastroesophageal reflux disease)   . History of blood transfusion 2011   "after one of my OR's"  . History of hiatal hernia   . History of stomach ulcers   . HSV (herpes simplex virus) infection   . Hx of radiation therapy 05/05/12- 07/15/12   right breast, 45 gray x 25 fx, lumpectomy cavity boosted to 16.2 gray  . Hyperlipemia   . Hypertension    sees Dr. Criss Rosales , [facility] [place]  . Hypothyroidism   . Kidney stones   . Knee pain, bilateral   . Migraines    "~ 3 times/month" (01/06/2014)  . Obesity   . OSA on CPAP    pt does not know settings  . Pancreatitis    hx of  . Pneumonia 1950's  . Polyneuropathy in diabetes(357.2)   .  Schizophrenia ([place])   . 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 Tachycardia    with sx in 2008  . Thyroid disease   . Type II diabetes mellitus ([facility])     PSH:  Past Surgical History:  Procedure Laterality Date  . ABDOMINAL HYSTERECTOMY  1979?   partial  . BREAST LUMPECTOMY  03/03/2012   Procedure: LUMPECTOMY;  Surgeon: Haywood Lasso, MD;  Location: [facility];  Service: General;  Laterality: Right;  . BREAST SURGERY Right 01/2012   "cancer"  . CHOLECYSTECTOMY  1980's?  . COLONOSCOPY WITH PROPOFOL N/A 09/28/2012   Procedure: COLONOSCOPY WITH PROPOFOL;  Surgeon: Lear Ng, MD;  Location: [facility];  Service: Endoscopy;  Laterality: N/A;  . FOOT  FRACTURE SURGERY Right 1990's  . JOINT REPLACEMENT    . REVISION TOTAL KNEE ARTHROPLASTY  07/2009  . SHOULDER OPEN ROTATOR CUFF REPAIR Left 1990's  . THYROIDECTOMY  1970's  . TOTAL KNEE ARTHROPLASTY Bilateral 2009-06/2009   left; right    Family history: Family History  Problem Relation Age of Onset  . Heart disease Brother     Multiple MIs, starting in his 79s  . Diabetes Brother   . Hypertension Mother   . Diabetes Mother   . Breast cancer Mother 6  . Bone cancer Mother   . Hypertension Sister   . Diabetes Sister   . Breast cancer Sister 51  . Breast cancer Maternal Grandmother   . Heart disease Maternal Grandmother   . Uterine cancer Other 19  . Breast cancer Paternal Aunt 64  . Breast cancer Paternal Grandmother     dx in her 7s  . Prostate cancer Paternal Grandfather     Social history:  Social History   Social History  . Marital status: Divorced    Spouse name: N/A  . Number of children: 2  . Years of education: 13   Occupational History  . Disabled    Social History Main Topics  . Smoking status: Former Smoker    Packs/day: 1.00    Years: 7.00    Types: Cigarettes    Quit date: 03/24/1992  . Smokeless tobacco: Never Used  . 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 Drug use: No  . Sexual activity: No   Other Topics Concern  . Not on file   Social History Narrative   Single. Disabled Biochemist, clinical.       Patient does not drink caffeine.   Patient is right handed.     Current outpatient meds: Medications reviewed and reconciled.  No outpatient prescriptions have been marked as taking for the 04/23/16 encounter [facility] Encounter).    Current inpatient meds: Medications reviewed and reconciled.  No current facility-administered medications for this encounter.    Current Outpatient Prescriptions  Medication Sig Dispense Refill  . acetaminophen (TYLENOL) 500 MG tablet Take 500 mg by mouth every 6 (six)  hours. Scheduled until 3/8 and then starting 3/9 q6h PRN    . albuterol (PROAIR HFA) 108 (90 Base) MCG/ACT inhaler Inhale 2 puffs into the lungs every 4 (four) hours as needed for wheezing or shortness of breath.     [name] albuterol (PROVENTIL) (2.5 MG/3ML) 0.083% nebulizer solution Inhale 3 mLs into the lungs every 6 (six) hours as needed. For shortness of breath    . aspirin EC 81 MG tablet Take 1 tablet (81 mg total) by mouth daily. 30 tablet 0  . B Complex-C (  B-COMPLEX WITH VITAMIN C) tablet Take 1 tablet by mouth daily.    . carvedilol (COREG) 3.125 MG tablet Take 1 tablet (3.125 mg total) by mouth 2 (two) times daily with a meal. 60 tablet 0  . Cholecalciferol (VITAMIN D-3) 1000 units CAPS Take 1,000 Units by mouth daily.    . diclofenac sodium (VOLTAREN) 1 % GEL Apply 2-4 g topically every 8 (eight) hours. Apply 2 gm to right wrist and 4 mg to both knees x2 weeks    . esomeprazole (NEXIUM) 40 MG capsule Take 1 capsule (40 mg total) by mouth daily before breakfast. 30 capsule 1  . ferrous sulfate 325 (65 FE) MG EC tablet Take 1 tablet (325 mg total) by mouth daily with breakfast. 30 tablet 0  . furosemide (LASIX) 40 MG tablet Take 1 tablet (40 mg total) by mouth daily. 30 tablet 0  . Insulin Detemir (LEVEMIR FLEXTOUCH) 100 UNIT/ML Pen Inject 10 Units into the skin at bedtime.     [name] levothyroxine (SYNTHROID, LEVOTHROID) 175 MCG tablet Take 175 mcg by mouth daily before breakfast.    . magnesium oxide (MAG-OX) 400 MG tablet Take 400 mg by mouth daily.    . Menthol, Topical Analgesic, (ICY HOT) 16 % LIQD Apply 1 application topically every 8 (eight) hours. Apply to back q8h x2 weeks    . Multiple Vitamins-Minerals (ONE-A-DAY WOMENS 50+ ADVANTAGE) TABS Take 1 tablet by mouth daily.    [name] oxyCODONE (ROXICODONE) 5 MG immediate release tablet Take 1 tablet (5 mg total) by mouth every 8 (eight) hours as needed for severe pain. >7/10 90 tablet 0  . potassium chloride (K-DUR) 10 MEQ tablet Take 10 mEq by  mouth daily.    . rosuvastatin (CRESTOR) 20 MG tablet Take 20 mg by mouth daily.    . sitaGLIPtin-metformin (JANUMET) 50-500 MG per tablet Take 1 tablet by mouth 2 (two) times daily with a meal.     . venlafaxine XR (EFFEXOR-XR) 150 MG 24 hr capsule Take 150 mg by mouth 2 (two) times daily.      Allergies: Allergies  Allergen Reactions  . Bee Venom Anaphylaxis  . Shellfish Allergy Anaphylaxis  . Buprenorphine Hcl Other (See Comments)    Overly sedated    ROS: As per HPI. A full 14-point review of systems Cannot be obtained as the patient is up [place] and unable to provide.   PE:  Temp 98 F (36.7 C) (Oral)   General: Morbidly obese African-American woman lying on the gurney with snoring respirations. She will briefly arouse to voice and tactile stimulation but then quickly goes back to sleep again. She did not follow any commands. She was nonverbal. Exam is therefore limited largely to observation and reflex testing.  HEENT: Normocephalic. Neck supple without LAD. Unable to evaluate oropharynx that she doesn't open her mouth. Sclerae anicteric. No conjunctival injection.  CV: Distant, regular, no murmur. Carotid pulses full and symmetric, no bruits. Distal pulses 2+ and symmetric.  Lungs: CTAB on anterior exam, limited by habitus.  Abdomen: Soft, morbidly obese, possible ventral hernia noted. Bowel sounds present x4.  Extremities: No C/C/E. Neuro:  CN: Pupils are equal and round. They are symmetrically reactive from 2-->1 mm. She does not blink to visual threat. Her eyes are mildly dysconjugate but they become conjugate at her most alert. No nystagmus. No forced deviation. Corneals are present. Her face is grossly symmetric and she has symmetric grimace. The remainder of her cranial nerves cannot be accurately assessed as she  is not able to participate with the examination.  Motor: Normal bulk, tone. She does not participate with confrontational strength testing. Some spontaneous movement  of the arms is noted but this is limited. No tremor or other abnormal movements.   Sensation: She grimaces and withdraws to nailbed pressure 4.  DTRs: 2+, symmetric. Toes downgoing bilaterally. No pathologic reflexes.  Coordination and gait: These cannot be assessed as the patient is unable to participate with the examination.   Labs:  Lab Results  Component Value Date   WBC 10.9 (H) 04/23/2016   HGB 10.5 (L) 04/23/2016   HCT 31.0 (L) 04/23/2016   PLT 301 04/23/2016   GLUCOSE 112 (H) 04/23/2016   CHOL 111 02/29/2016   TRIG 104 02/29/2016   HDL 55 02/29/2016   LDLCALC 35 02/29/2016   ALT 17 04/23/2016   AST 19 04/23/2016   NA 124 (L) 04/23/2016   K 3.7 04/23/2016   CL 89 (L) 04/23/2016   CREATININE 2.10 (H) 04/23/2016   BUN 27 (H) 04/23/2016   CO2 22 04/23/2016   TSH 10.93 (A) 04/10/2016   INR 1.06 04/23/2016   HGBA1C 6.1 (H) 02/16/2016   Serum ethanol less than 5 Coags normal Creatinine is 2.1, up from 0.9 on 04/16/16 Sodium is 124, down from 135 on 04/08/16  Imaging:  I have personally and independently reviewed the CT scan of the head without contrast from today. She has mild diffuse generalized atrophy. His examination is otherwise unremarkable.  Assessment and Plan:  1. Acute encephalopathy: This is most likely multifactorial in etiology with potential contributions from polypharmacy, medication misadventure, hyponatremia, and acute renal dysfunction. She doesn't have any significant neurologic findings on examination that would suggest an acute ischemic infarction. CT scan of the head does not reveal any obvious structural abnormality that would explain her presentation. She has not had any seizure-like activity and currently is not behaving as if she is seizing. She does not need any further workup for stroke at this time. Optimize metabolic status as you are. Allow time for any potential ingestion to clear her system. Minimize the use of opiates, benzos or any medication  with strong anticholinergic properties as much as possible. Optimize sleep-wake cycles as much as you can by keeping the room bright with activity during the day and dark and quiet at night.   No family was present at the time of my visit.  I discussed my impression and recommendations with the ED attending, Dr. Wilson Singer.   I have no additional recommendations at this time and will sign off. Please call if any new issues arise.

## 2016-04-23 NOTE — ED Notes (Signed)
Pt's son [name] [phone] and daughter Gerilyn Nestle [phone]

## 2016-04-23 NOTE — ED Notes (Signed)
ED Provider at bedside.

## 2016-04-23 NOTE — ED Provider Notes (Signed)
[facility] DEPT Provider Note    By signing my name below, I, Bea Graff, attest that this documentation has been prepared under the direction and in the presence of Virgel Manifold, MD. Electronically Signed: Bea Graff, ED Scribe. 04/23/16. 3:29 PM.    History   Chief Complaint Chief Complaint  Patient presents with  . Altered Mental Status   The history is provided by the patient and medical records. No language interpreter was used.    LEVEL 5 CAVEAT- Full history could not be obtained due to decreased mental status.  [name] is a morbidly obese 61 y.o. female with PMHx of asthma, right breast cancer, chronic pain syndrome, CKD, HLD, HTN, hypothyroidism, T2DM and schizophrenia who presents to the Emergency Department complaining of with decreased mental status. She responds to commands minimally and is only aroused by a loud voice. Her speech is slurred when attempting to answer any questions and she cannot be understood. Falls asleep repeatedly during exam.   Past Medical History:  Diagnosis Date  . Anemia   . Anxiety   . Arthritis    "pretty much all my joints"  . Asthma   . Benign paroxysmal positional vertigo 12/30/2012  . Breast cancer ([place]) 02/13/12   ruq  100'clock bx Ductal Carcinoma in Situ,(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  . Breast mass in female    L breast 2008, US showed likely fat necrosis  . Chronic lower back pain   . CKD (chronic kidney disease), stage III    "lower stage" (01/06/2014)  . Daily headache   . Depression   . GERD (gastroesophageal reflux disease)   . History of blood transfusion 2011   "after one of my OR's"  . History of hiatal hernia   . History of stomach ulcers   . HSV (herpes simplex virus) infection   . Hx of radiation therapy 05/05/12- 07/15/12   right breast, 45 gray x 25 fx, lumpectomy cavity boosted to 16.2 gray  . Hyperlipemia   . Hypertension    sees Dr. Criss Rosales , [place]  . Hypothyroidism   . Kidney  stones   . Knee pain, bilateral   . Migraines    "~ 3 times/month" (01/06/2014)  . Obesity   . OSA on CPAP    pt does not know settings  . Pancreatitis    hx of  . Pneumonia 1950's  . Polyneuropathy in diabetes(357.2)   . Schizophrenia ([place])   . 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 Tachycardia    with sx in 2008  . Thyroid disease   . Type II diabetes mellitus [facility])     Patient Active Problem List   Diagnosis Date Noted  . Schizophrenia, paranoid ([place])   . Aspiration pneumonia of left lower lobe due to gastric secretions ([place])   . Projectile vomiting with nausea   . Altered mental status   . Schizophrenia ([place])   . Disorganized schizophrenia ([place])   . Chronic pain syndrome   . Acute renal failure ([place])   . Controlled diabetes mellitus type 2 with complications ([place])   . Acute encephalopathy   . Oropharyngeal dysphagia   . Aspiration pneumonia of right lower lobe due to vomit ([place])   . Acute renal failure (ARF) ([place]) 03/28/2016  . Encephalopathy 03/27/2016  . HCAP (healthcare-associated pneumonia) 02/29/2016  . Pneumonia 02/14/2016  . CAP (community acquired pneumonia) 02/13/2016  . Edema 02/13/2016  . AKI (acute kidney injury) ([place]) 02/13/2016  . Elevated troponin 06/24/2015  .  Diarrhea 06/23/2015  . Sepsis ([place]) 06/23/2015  . Obesity, morbid, BMI 50 or higher ([place]) 03/26/2015  . Post traumatic stress disorder (PTSD) 11/14/2014  . Leukocytosis 11/12/2014  . Slurred speech 11/12/2014  . CKD (chronic kidney disease) stage 3, GFR 30-59 ml/min   . Diabetes mellitus, type II, insulin dependent ([place])   . TIA (transient ischemic attack)   . Hypotension 10/28/2014  . Syncope 10/28/2014  . Anemia 10/28/2014  . Type II diabetes mellitus ([place]) 10/28/2014  . OSA on CPAP 10/28/2014  . Chronic lower back pain 10/28/2014  . Polyneuropathy in diabetes(357.2) 10/28/2014  . Diabetic polyneuropathy ([place])   . Fall 08/14/2014  . Recurrent falls 08/14/2014  . Hot flashes  02/24/2014  . Arthralgia 02/24/2014  . Hair loss 02/24/2014  . Chest pain 01/06/2014  . Shock ([place]) 08/08/2013  . 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 Dysarthria 12/30/2012  . Benign paroxysmal positional vertigo 12/30/2012  . Breast cancer of upper-outer quadrant of right female breast ([place]) 11/18/2012  . Hemorrhage of rectum and anus 09/28/2012  . Hx of radiation therapy   . Syncope and collapse 06/04/2011  . HSV 10/05/2008  . Asthma 06/02/2008  . HLD (hyperlipidemia) 06/02/2006  . Sinus tachycardia 05/25/2006  . Hypothyroidism 02/27/2006  . Obesity 02/27/2006  . Depression 02/27/2006  . Essential hypertension 02/27/2006  . GERD 02/27/2006  . KNEE PAIN 02/27/2006    Past Surgical History:  Procedure Laterality Date  . ABDOMINAL HYSTERECTOMY  1979?   partial  . BREAST LUMPECTOMY  03/03/2012   Procedure: LUMPECTOMY;  Surgeon: Haywood Lasso, MD;  Location: [facility];  Service: General;  Laterality: Right;  . BREAST SURGERY Right 01/2012   "cancer"  . CHOLECYSTECTOMY  1980's?  . COLONOSCOPY WITH PROPOFOL N/A 09/28/2012   Procedure: COLONOSCOPY WITH PROPOFOL;  Surgeon: Lear Ng, MD;  Location: [facility];  Service: Endoscopy;  Laterality: N/A;  . FOOT FRACTURE SURGERY Right 1990's  . JOINT REPLACEMENT    . REVISION TOTAL KNEE ARTHROPLASTY  07/2009  . SHOULDER OPEN ROTATOR CUFF REPAIR Left 1990's  . THYROIDECTOMY  1970's  . TOTAL KNEE ARTHROPLASTY Bilateral 2009-06/2009   left; right    OB History    No data available       Home Medications    Prior to Admission medications   Medication Sig Start Date End Date Taking? Authorizing Provider  acetaminophen (TYLENOL) 500 MG tablet Take 500 mg by mouth every 6 (six) hours. Scheduled until 3/8 and then starting 3/9 q6h PRN 04/09/16   Historical Provider, MD  albuterol (PROAIR HFA) 108 (90 Base) MCG/ACT inhaler Inhale 2 puffs into the lungs every 4 (four) hours as needed for wheezing or shortness of breath.      Historical Provider, MD  albuterol (PROVENTIL) (2.5 MG/3ML) 0.083% nebulizer solution Inhale 3 mLs into the lungs every 6 (six) hours as needed. For shortness of breath 11/03/15   Historical Provider, MD  aspirin EC 81 MG tablet Take 1 tablet (81 mg total) by mouth daily. 08/12/13   Sheila Oats, MD  B Complex-C (B-COMPLEX WITH VITAMIN C) tablet Take 1 tablet by mouth daily.    Historical Provider, MD  carvedilol (COREG) 3.125 MG tablet Take 1 tablet (3.125 mg total) by mouth 2 (two) times daily with a meal. 02/17/16   Florencia Reasons, MD  Cholecalciferol (VITAMIN D-3) 1000 units CAPS Take 1,000 Units by mouth daily.    Historical Provider, MD  diclofenac sodium (VOLTAREN) 1 % GEL Apply 2-4  g topically every 8 (eight) hours. Apply 2 gm to right wrist and 4 mg to both knees x2 weeks 04/09/16 04/24/16  Historical Provider, MD  esomeprazole (NEXIUM) 40 MG capsule Take 1 capsule (40 mg total) by mouth daily before breakfast. 10/30/14   Barton Dubois, MD  ferrous sulfate 325 (65 FE) MG EC tablet Take 1 tablet (325 mg total) by mouth daily with breakfast. 02/17/16   Florencia Reasons, MD  furosemide (LASIX) 40 MG tablet Take 1 tablet (40 mg total) by mouth daily. 02/17/16   Florencia Reasons, MD  Insulin Detemir (LEVEMIR FLEXTOUCH) 100 UNIT/ML Pen Inject 10 Units into the skin at bedtime.     Historical Provider, MD  levothyroxine (SYNTHROID, LEVOTHROID) 175 MCG tablet Take 175 mcg by mouth daily before breakfast.    Historical Provider, MD  magnesium oxide (MAG-OX) 400 MG tablet Take 400 mg by mouth daily.    Historical Provider, MD  Menthol, Topical Analgesic, (ICY HOT) 16 % LIQD Apply 1 application topically every 8 (eight) hours. Apply to back q8h x2 weeks 04/10/16 04/24/16  Historical Provider, MD  Multiple Vitamins-Minerals (ONE-A-DAY WOMENS 50+ ADVANTAGE) TABS Take 1 tablet by mouth daily.    Historical Provider, MD  oxyCODONE (ROXICODONE) 5 MG immediate release tablet Take 1 tablet (5 mg total) by mouth every 8 (eight) hours as  needed for severe pain. >7/10 04/09/16   Gildardo Cranker, DO  potassium chloride (K-DUR) 10 MEQ tablet Take 10 mEq by mouth daily.    Historical Provider, MD  rosuvastatin (CRESTOR) 20 MG tablet Take 20 mg by mouth daily.    Historical Provider, MD  sitaGLIPtin-metformin (JANUMET) 50-500 MG per tablet Take 1 tablet by mouth 2 (two) times daily with a meal.     Historical Provider, MD  venlafaxine XR (EFFEXOR-XR) 150 MG 24 hr capsule Take 150 mg by mouth 2 (two) times daily. 05/21/15   Historical Provider, MD    Family History Family History  Problem Relation Age of Onset  . Heart disease Brother     Multiple MIs, starting in his 8s  . Diabetes Brother   . Hypertension Mother   . Diabetes Mother   . Breast cancer Mother 57  . Bone cancer Mother   . Hypertension Sister   . Diabetes Sister   . Breast cancer Sister 53  . Breast cancer Maternal Grandmother   . Heart disease Maternal Grandmother   . Uterine cancer Other 19  . Breast cancer Paternal Aunt 44  . Breast cancer Paternal Grandmother     dx in her 33s  . Prostate cancer Paternal Grandfather     Social History Social History  Substance Use Topics  . Smoking status: Former Smoker    Packs/day: 1.00    Years: 7.00    Types: Cigarettes    Quit date: 03/24/1992  . Smokeless tobacco: Never Used  . Alcohol use No     Comment: "stopped drinking in 1996; I was an alcoholic"     Allergies   Bee venom; Shellfish allergy; and Buprenorphine hcl   Review of Systems Review of Systems  Unable to perform ROS: Mental status change   LEVEL 5 CAVEAT- Full history could not be obtained due to decreased mental status.   Physical Exam Updated Vital Signs Temp 98 F (36.7 C) (Oral)   Physical Exam  Constitutional: She appears well-developed and well-nourished. No distress.  HENT:  Head: Normocephalic and atraumatic.  Eyes: EOM are normal.  Neck: Normal range of motion.  Cardiovascular: Normal rate, regular rhythm and normal  heart sounds.   Pulmonary/Chest: Effort normal and breath sounds normal.  Abdominal: Soft. She exhibits no distension. There is no tenderness.  Musculoskeletal: Normal range of motion.  No external trauma.  Neurological:  Lethargic. Will open eyes to loud voice but will not stay awake. Speech is very slurred. Will intermittently follow commands. Moves all extremities but is globally weak.  Skin: Skin is warm and dry.  Psychiatric: She has a normal mood and affect. Judgment normal.  Nursing note and vitals reviewed.    ED Treatments / Results  COORDINATION OF CARE: 3:11 PM- Will order IV fluids and Narcan.  Medications  sodium chloride 0.9 % bolus 1,000 mL (1,000 mLs Intravenous New Bag/Given 04/23/16 1539)  naloxone (NARCAN) injection 0.4 mg (0.4 mg Intravenous Given 04/23/16 1540)    Labs (all labs ordered are listed, but only abnormal results are displayed) Labs Reviewed  CBC - Abnormal; Notable for the following:       Result Value   WBC 10.9 (*)    RBC 3.69 (*)    Hemoglobin 9.6 (*)    HCT 29.6 (*)    RDW 16.9 (*)    All other components within normal limits  COMPREHENSIVE METABOLIC PANEL - Abnormal; Notable for the following:    Sodium 124 (*)    Chloride 89 (*)    Glucose, Bld 113 (*)    BUN 28 (*)    Creatinine, Ser 2.02 (*)    Calcium 8.7 (*)    Total Protein 6.1 (*)    Albumin 3.3 (*)    GFR calc non Af Amer 26 (*)    GFR calc Af Amer 30 (*)    All other components within normal limits  RAPID URINE DRUG SCREEN, HOSP PERFORMED - Abnormal; Notable for the following:    Opiates POSITIVE (*)    Benzodiazepines POSITIVE (*)    All other components within normal limits  URINALYSIS, ROUTINE W REFLEX MICROSCOPIC - Abnormal; Notable for the following:    APPearance HAZY (*)    Hgb urine dipstick SMALL (*)    Leukocytes, UA SMALL (*)    Bacteria, UA RARE (*)    Squamous Epithelial / LPF 0-5 (*)    All other components within normal limits  CBC - Abnormal; Notable  for the following:    RBC 3.42 (*)    Hemoglobin 9.0 (*)    HCT 27.5 (*)    RDW 16.8 (*)    All other components within normal limits  BASIC METABOLIC PANEL - Abnormal; Notable for the following:    Sodium 129 (*)    Potassium 3.4 (*)    Chloride 94 (*)    Glucose, Bld 103 (*)    BUN 24 (*)    Creatinine, Ser 1.82 (*)    Calcium 8.8 (*)    GFR calc non Af Amer 29 (*)    GFR calc Af Amer 34 (*)    All other components within normal limits  GLUCOSE, CAPILLARY - Abnormal; Notable for the following:    Glucose-Capillary 103 (*)    All other components within normal limits  GLUCOSE, CAPILLARY - Abnormal; Notable for the following:    Glucose-Capillary 123 (*)    All other components within normal limits  BASIC METABOLIC PANEL - Abnormal; Notable for the following:    Sodium 134 (*)    CO2 20 (*)    Glucose, Bld 189 (*)    Creatinine, Ser  1.22 (*)    Calcium 8.8 (*)    GFR calc non Af Amer 48 (*)    GFR calc Af Amer 55 (*)    All other components within normal limits  GLUCOSE, CAPILLARY - Abnormal; Notable for the following:    Glucose-Capillary 113 (*)    All other components within normal limits  GLUCOSE, CAPILLARY - Abnormal; Notable for the following:    Glucose-Capillary 121 (*)    All other components within normal limits  GLUCOSE, CAPILLARY - Abnormal; Notable for the following:    Glucose-Capillary 140 (*)    All other components within normal limits  GLUCOSE, CAPILLARY - Abnormal; Notable for the following:    Glucose-Capillary 168 (*)    All other components within normal limits  GLUCOSE, CAPILLARY - Abnormal; Notable for the following:    Glucose-Capillary 143 (*)    All other components within normal limits  I-STAT CHEM 8, ED - Abnormal; Notable for the following:    Sodium 124 (*)    Chloride 89 (*)    BUN 27 (*)    Creatinine, Ser 2.10 (*)    Glucose, Bld 112 (*)    Calcium, Ion 1.07 (*)    Hemoglobin 10.5 (*)    HCT 31.0 (*)    All other components  within normal limits  ETHANOL  PROTIME-INR  APTT  DIFFERENTIAL  GLUCOSE, CAPILLARY  GLUCOSE, CAPILLARY  I-STAT TROPOININ, ED  CBG MONITORING, ED    EKG  EKG Interpretation  Date/Time:  Wednesday April 23 2016 14:28:38 EST Ventricular Rate:  99 PR Interval:    QRS Duration: 84 QT Interval:  357 QTC Calculation: 459 R Axis:   -30 Text Interpretation:  Sinus rhythm Left axis deviation Low voltage, precordial leads Abnormal R-wave progression, early transition Confirmed by Wilson Singer  MD, Emelie Newsom [phone]) on 04/23/2016 5:06:36 PM       Radiology Ct Head Code Stroke W/o Cm  Result Date: 04/23/2016 CLINICAL DATA:  Code stroke. Acute onset of slurred speech and weakness. EXAM: CT HEAD WITHOUT CONTRAST TECHNIQUE: Contiguous axial images were obtained from the base of the skull through the vertex without intravenous contrast. COMPARISON:  CT head without contrast 04/01/2016 FINDINGS: Brain: No acute infarct, hemorrhage, or mass lesion is present. The ventricles are of normal size. No significant extraaxial fluid collection is present. No significant white matter disease is present. The brainstem and cerebellum are within normal limits. Vascular: No hyperdense vessel or unexpected calcification. Skull: The calvarium is within normal limits. No focal lytic or blastic lesions are present. Sinuses/Orbits: The paranasal sinuses are clear. Debris in the external auditory canals is again noted. Go ASPECTS [facility] Stroke Program Early CT Score) - Ganglionic level infarction (caudate, lentiform nuclei, internal capsule, insula, M1-M3 cortex): 7/7 - Supraganglionic infarction (M4-M6 cortex): 3/3 Total score (0-10 with 10 being normal): 10/10 IMPRESSION: 1. Normal CT appearance of the brain. No acute or focal lesion to explain abnormal speech or weakness. 2. ASPECTS is 10/10 Electronically Signed   By: San Morelle M.D.   On: 04/23/2016 14:20    Procedures Procedures (including critical care  time)  CRITICAL CARE Performed by: Virgel Manifold Total critical care time: 35 minutes Critical care time was exclusive of separately billable procedures and treating other patients. Critical care was necessary to treat or prevent imminent or life-threatening deterioration. Critical care was time spent personally by me on the following activities: development of treatment plan with patient and/or surrogate as well as nursing, discussions with consultants,  evaluation of patient's response to treatment, examination of patient, obtaining history from patient or surrogate, ordering and performing treatments and interventions, ordering and review of laboratory studies, ordering and review of radiographic studies, pulse oximetry and re-evaluation of patient's condition.   Medications Ordered in ED Medications  sodium chloride 0.9 % bolus 1,000 mL (not administered)  naloxone (NARCAN) injection 0.4 mg (not administered)     Initial Impression / Assessment and Plan / ED Course  I have reviewed the triage vital signs and the nursing notes.  Pertinent labs & imaging results that were available during my care of the patient were reviewed by me and considered in my medical decision making (see chart for details).     61 year old female with decreased mental status. She was initially made a code stroke because of her level of consciousness and dysarthria. She was evaluated by neurology and this was subsequently canceled. Suspect that this is likely polypharmacy. She was admitted last month with a very similar sounding presentation. Her son reports that she is discharged to a facility and was doing much better when they were administering her medications. She came home this past Saturday. She lives by herself. He last spoke to her yesterday evening. At the time she sounded very tired and had slurred speech. He encouraged her via the phone to come to the emergency room but she refused. Today her aide came  to see her and is the one who called EMS. She is on numerous sedating medications. She was empirically given some Narcan had no clinical response. She is no external signs of acute trauma. She is afebrile. She does have some hypotension. This could be secondary to some of her medications she also has AKI/hyponatremia. There may be some element of dehydration as I doubt she has been eating or drinking much in her current condition.   I personally preformed the services scribed in my presence. The recorded information has been reviewed is accurate. Virgel Manifold, MD.   Final Clinical Impressions(s) / ED Diagnoses   Final diagnoses:  Altered mental status, unspecified altered mental status type  Hyponatremia    New Prescriptions New Prescriptions   No medications on file     Virgel Manifold, MD 05/06/16 1319

## 2016-04-23 NOTE — ED Notes (Signed)
Pt noted to be sleeping soundly upon entry to room, pt open eyes to verbal stimuli, stating she is having pain in neck, "i think I slept wrong". VSS, NAD, will continue to monitor.

## 2016-04-24 ENCOUNTER — Encounter: Payer: Self-pay | Admitting: Adult Health

## 2016-04-24 ENCOUNTER — Encounter (HOSPITAL_COMMUNITY): Payer: Self-pay

## 2016-04-24 DIAGNOSIS — Z9989 Dependence on other enabling machines and devices: Secondary | ICD-10-CM

## 2016-04-24 DIAGNOSIS — G894 Chronic pain syndrome: Secondary | ICD-10-CM

## 2016-04-24 DIAGNOSIS — F200 Paranoid schizophrenia: Secondary | ICD-10-CM

## 2016-04-24 DIAGNOSIS — G4733 Obstructive sleep apnea (adult) (pediatric): Secondary | ICD-10-CM

## 2016-04-24 DIAGNOSIS — Z87891 Personal history of nicotine dependence: Secondary | ICD-10-CM

## 2016-04-24 LAB — CBC
HEMATOCRIT: 27.5 % — AB (ref 36.0–46.0)
HEMOGLOBIN: 9 g/dL — AB (ref 12.0–15.0)
MCH: 26.3 pg (ref 26.0–34.0)
MCHC: 32.7 g/dL (ref 30.0–36.0)
MCV: 80.4 fL (ref 78.0–100.0)
Platelets: 242 10*3/uL (ref 150–400)
RBC: 3.42 MIL/uL — ABNORMAL LOW (ref 3.87–5.11)
RDW: 16.8 % — ABNORMAL HIGH (ref 11.5–15.5)
WBC: 7.9 10*3/uL (ref 4.0–10.5)

## 2016-04-24 LAB — BASIC METABOLIC PANEL
Anion gap: 11 (ref 5–15)
BUN: 24 mg/dL — ABNORMAL HIGH (ref 6–20)
CHLORIDE: 94 mmol/L — AB (ref 101–111)
CO2: 24 mmol/L (ref 22–32)
CREATININE: 1.82 mg/dL — AB (ref 0.44–1.00)
Calcium: 8.8 mg/dL — ABNORMAL LOW (ref 8.9–10.3)
GFR calc non Af Amer: 29 mL/min — ABNORMAL LOW (ref >60)
GFR, EST AFRICAN AMERICAN: 34 mL/min — AB (ref >60)
Glucose, Bld: 103 mg/dL — ABNORMAL HIGH (ref 65–99)
Potassium: 3.4 mmol/L — ABNORMAL LOW (ref 3.5–5.1)
Sodium: 129 mmol/L — ABNORMAL LOW (ref 135–145)

## 2016-04-24 LAB — GLUCOSE, CAPILLARY
GLUCOSE-CAPILLARY: 103 mg/dL — AB (ref 65–99)
GLUCOSE-CAPILLARY: 94 mg/dL (ref 65–99)
GLUCOSE-CAPILLARY: 113 mg/dL — AB (ref 65–99)
Glucose-Capillary: 123 mg/dL — ABNORMAL HIGH (ref 65–99)
Glucose-Capillary: 95 mg/dL (ref 65–99)

## 2016-04-24 LAB — CBG MONITORING, ED: Glucose-Capillary: 95 mg/dL (ref 65–99)

## 2016-04-24 IMAGING — CT CT HEAD W/O CM
1 series · 16 of 30 positions shown, 20 images · non-contrast
Comparison: 04/22/2012

CLINICAL DATA: Syncope

EXAM:
CT HEAD WITHOUT CONTRAST
TECHNIQUE: Contiguous axial images were obtained from the base of the skull
through the vertex without intravenous contrast.

[Series 2: head 5.0 h30s · axial · 0.49mm/px · z∈[-83,+62]mm · 16 of 33 slices shown, 20 images]
[im 2/33  brain]
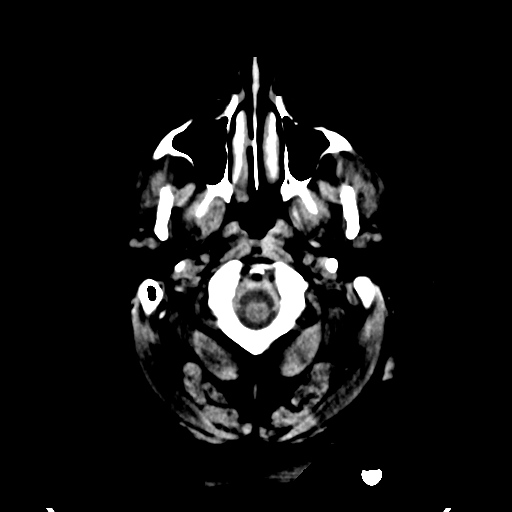
[im 2/33  bone]
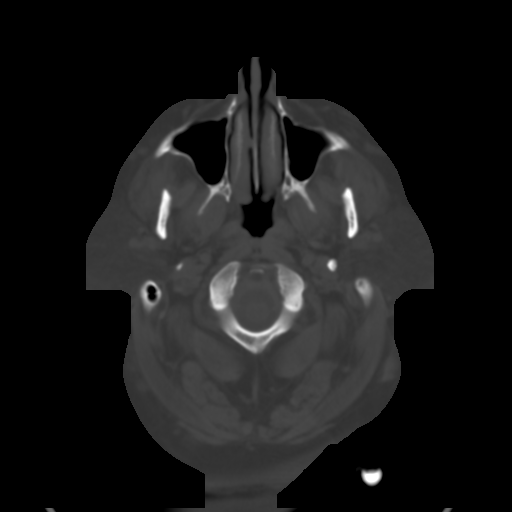
[im 4/33  brain]
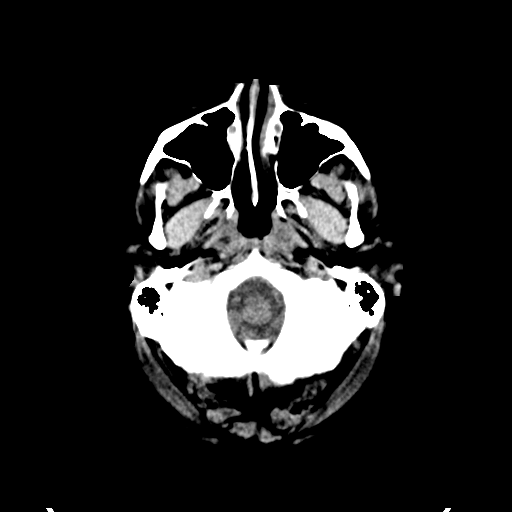
[im 6/33  brain]
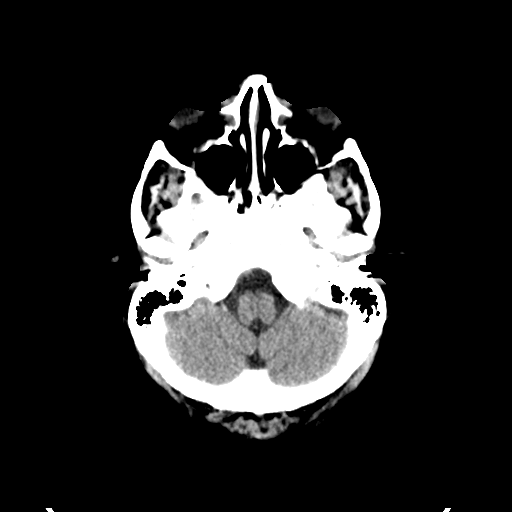
[im 8/33  brain]
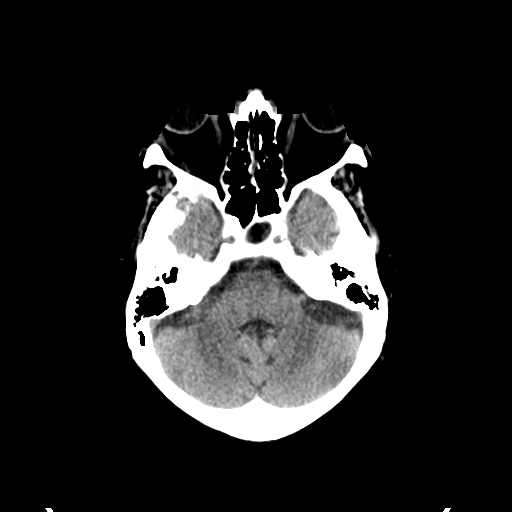
[im 9/33  brain]
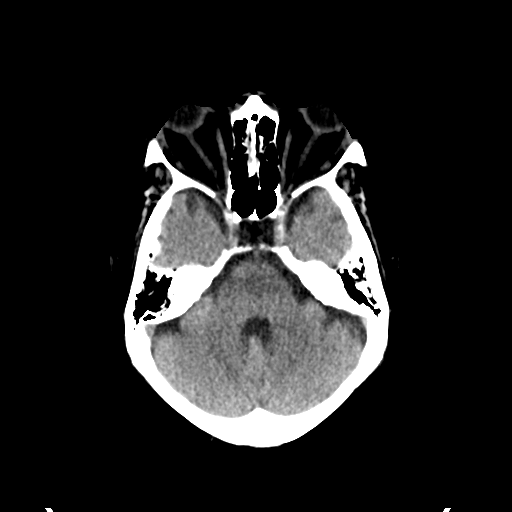
[im 9/33  bone]
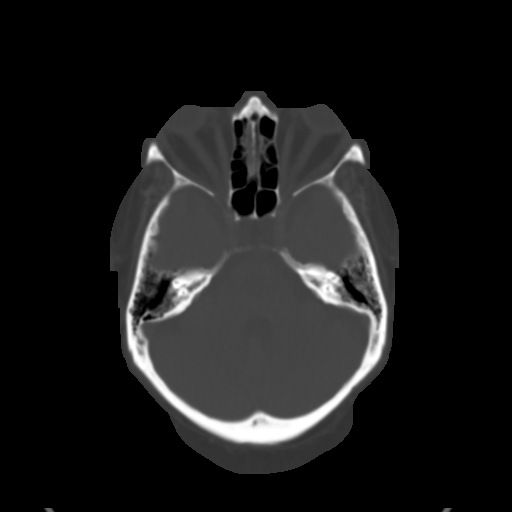
[im 12/33  brain]
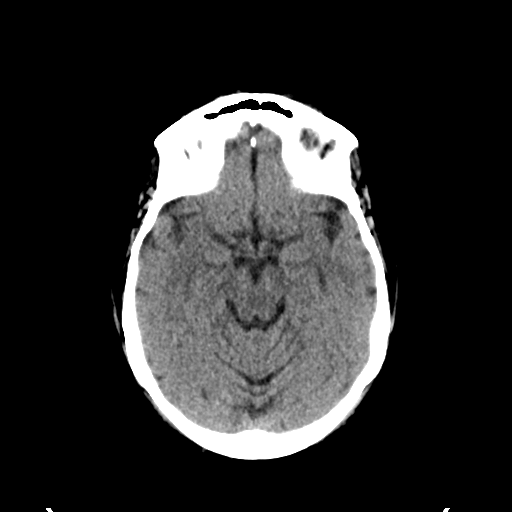
[im 14/33  brain]
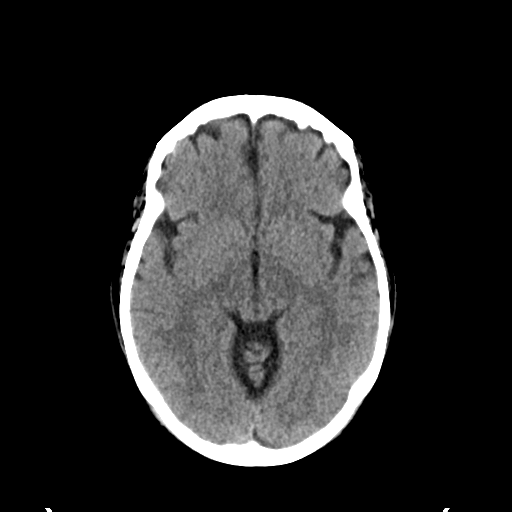
[im 16/33  brain]
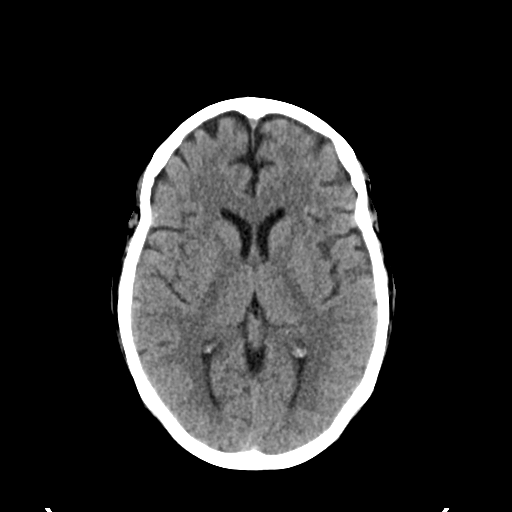
[im 17/33  brain]
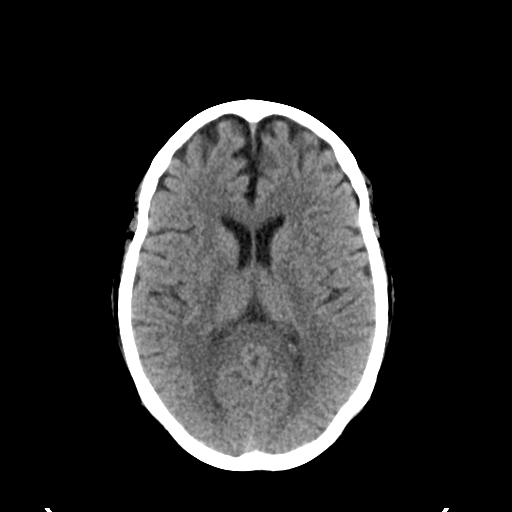
[im 17/33  bone]
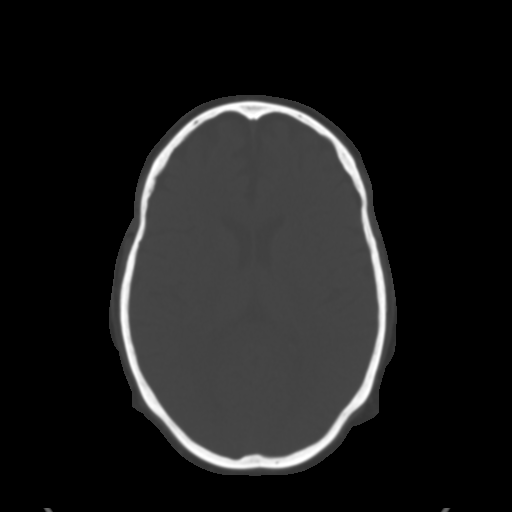
[im 19/33  brain]
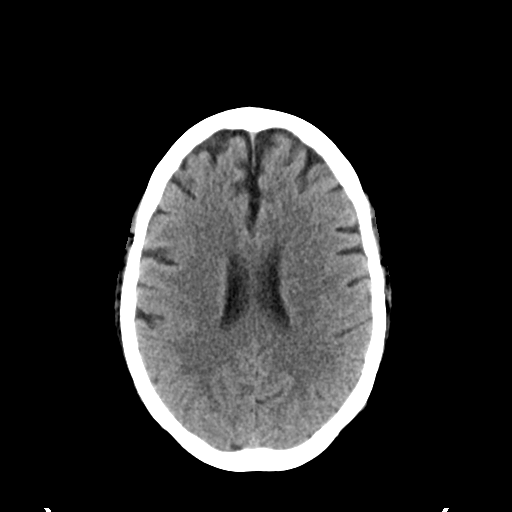
[im 21/33  brain]
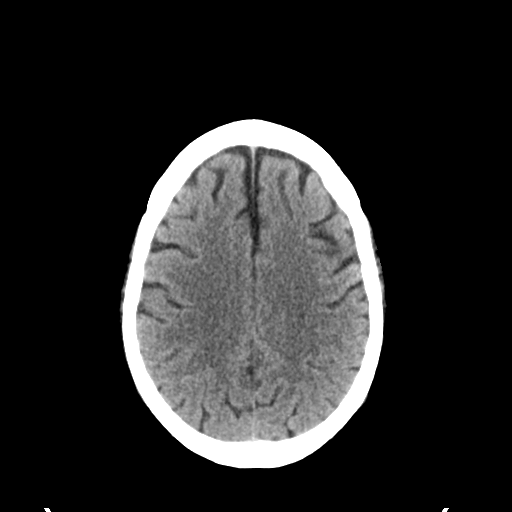
[im 24/33  brain]
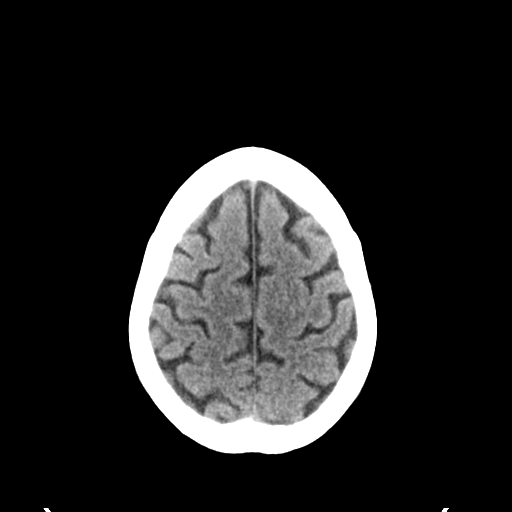
[im 25/33  brain]
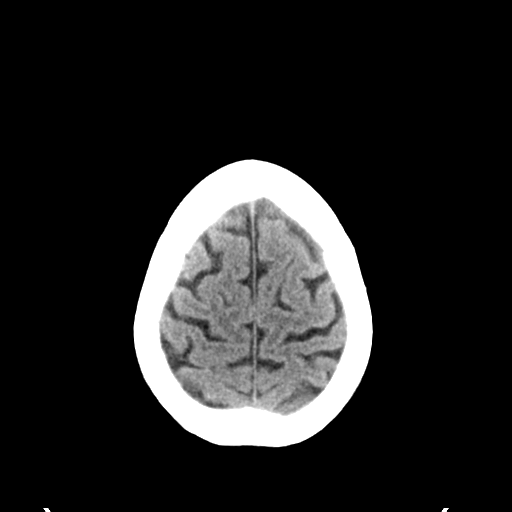
[im 25/33  bone]
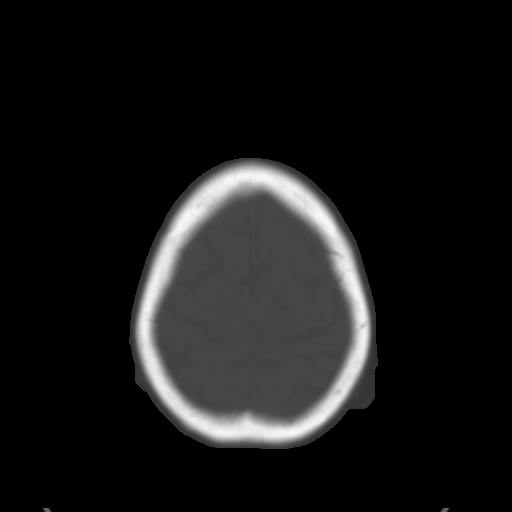
[im 27/33  brain]
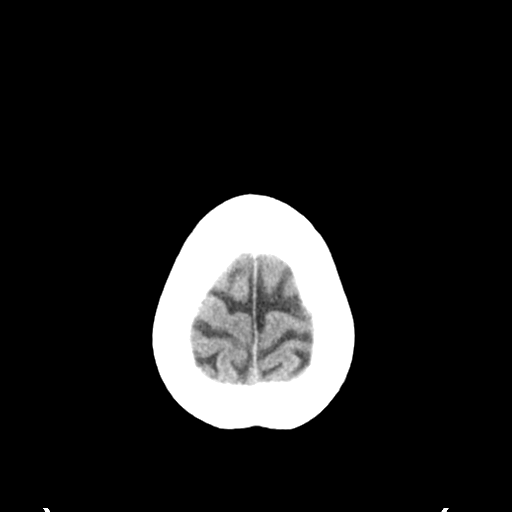
[im 29/33  brain]
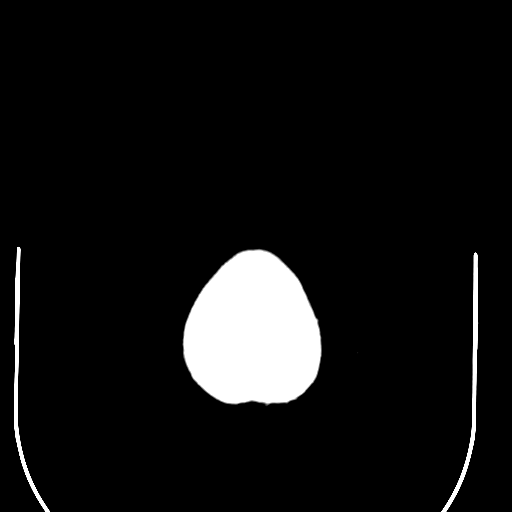
[im 31/33  brain]
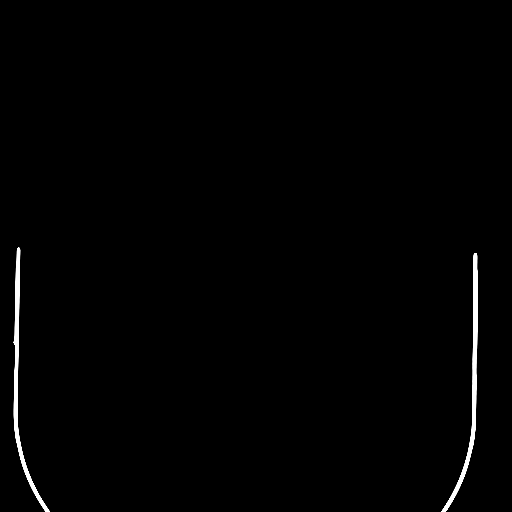

[16 of 30 positions shown; findings below may reference images not displayed]

FINDINGS: No mass effect, midline shift, or acute hemorrhage. Sella turcica
remain somewhat expanded and fluid-filled. Cerumen fills the
external auditory canals. No fracture. Mastoid air cells are clear.
Visualized paranasal sinuses are clear. Intact cranium.
IMPRESSION: No acute intracranial pathology.

## 2016-04-24 MED ORDER — INSULIN ASPART 100 UNIT/ML ~~LOC~~ SOLN
0.0000 [IU] | SUBCUTANEOUS | Status: DC
  Administered 2016-04-24 – 2016-04-25 (×3): 1 [IU] via SUBCUTANEOUS
  Administered 2016-04-25: 2 [IU] via SUBCUTANEOUS
  Administered 2016-04-25: 1 [IU] via SUBCUTANEOUS

## 2016-04-24 MED ORDER — LEVOTHYROXINE SODIUM 75 MCG PO TABS
150.0000 ug | ORAL_TABLET | Freq: Every day | ORAL | Status: DC
  Administered 2016-04-25: 150 ug via ORAL
  Filled 2016-04-24: qty 2

## 2016-04-24 MED ORDER — POTASSIUM CHLORIDE CRYS ER 20 MEQ PO TBCR
40.0000 meq | EXTENDED_RELEASE_TABLET | Freq: Once | ORAL | Status: AC
Start: 2016-04-24 — End: 2016-04-24
  Administered 2016-04-24: 40 meq via ORAL
  Filled 2016-04-24: qty 2

## 2016-04-24 MED ORDER — PANTOPRAZOLE SODIUM 40 MG PO TBEC
40.0000 mg | DELAYED_RELEASE_TABLET | Freq: Every day | ORAL | Status: DC
  Administered 2016-04-24 – 2016-04-25 (×2): 40 mg via ORAL
  Filled 2016-04-24 (×2): qty 1

## 2016-04-24 MED ORDER — PREGABALIN 75 MG PO CAPS
150.0000 mg | ORAL_CAPSULE | Freq: Two times a day (BID) | ORAL | Status: DC
  Administered 2016-04-24 (×2): 150 mg via ORAL
  Filled 2016-04-24 (×2): qty 2

## 2016-04-24 NOTE — ED Notes (Signed)
Assisted patient from bed to wheelchair to wheel to BR.

## 2016-04-24 NOTE — Progress Notes (Signed)
Patient refused CPAP use for the night. RT will continue to monitor as needed.

## 2016-04-24 NOTE — Evaluation (Signed)
Clinical/Bedside Swallow Evaluation Patient Details  Name: [name] MRN: [id number] Date of Birth: 02/06/1956  Today's Date: 04/24/2016 Time: SLP Start Time (ACUTE ONLY): 1700 SLP Stop Time (ACUTE ONLY): 1712 SLP Time Calculation (min) (ACUTE ONLY): 12 min  Past Medical History:  Past Medical History:  Diagnosis Date  . Anemia   . Anxiety   . Arthritis    "pretty much all my joints"  . Asthma   . Benign paroxysmal positional vertigo 12/30/2012  . Breast cancer ([place]) 02/13/12   ruq  100'clock bx Ductal Carcinoma in Situ,(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  . Breast mass in female    L breast 2008, US showed likely fat necrosis  . Chronic lower back pain   . CKD (chronic kidney disease), stage III    "lower stage" (01/06/2014)  . Daily headache   . Depression   . GERD (gastroesophageal reflux disease)   . History of blood transfusion 2011   "after one of my OR's"  . History of hiatal hernia   . History of stomach ulcers   . HSV (herpes simplex virus) infection   . Hx of radiation therapy 05/05/12- 07/15/12   right breast, 45 gray x 25 fx, lumpectomy cavity boosted to 16.2 gray  . Hyperlipemia   . Hypertension    sees Dr. Criss Rosales , [facility]  . Hypothyroidism   . Kidney stones   . Knee pain, bilateral   . Migraines    "~ 3 times/month" (01/06/2014)  . Obesity   . OSA on CPAP    pt does not know settings  . Pancreatitis    hx of  . Pneumonia 1950's  . Polyneuropathy in diabetes(357.2)   . Schizophrenia ([place])   . 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 Tachycardia    with sx in 2008  . Thyroid disease   . Type II diabetes mellitus ([place])    Past Surgical History:  Past Surgical History:  Procedure Laterality Date  . ABDOMINAL HYSTERECTOMY  1979?   partial  . BREAST LUMPECTOMY  03/03/2012   Procedure: LUMPECTOMY;  Surgeon: Haywood Lasso, MD;  Location: [facility];  Service: General;  Laterality: Right;  . BREAST SURGERY Right 01/2012   "cancer"  . CHOLECYSTECTOMY   1980's?  . COLONOSCOPY WITH PROPOFOL N/A 09/28/2012   Procedure: COLONOSCOPY WITH PROPOFOL;  Surgeon: Lear Ng, MD;  Location: [facility];  Service: Endoscopy;  Laterality: N/A;  . FOOT FRACTURE SURGERY Right 1990's  . JOINT REPLACEMENT    . REVISION TOTAL KNEE ARTHROPLASTY  07/2009  . SHOULDER OPEN ROTATOR CUFF REPAIR Left 1990's  . THYROIDECTOMY  1970's  . TOTAL KNEE ARTHROPLASTY Bilateral 2009-06/2009   left; right   HPI:  Pt admitted with encephalopathy presumed due to polypharmacy. PMH: recurrent PNA, chronic back pain, BPPV, morbid obesity,schizophrenia, secondary Parkinsonism.[name]  Has had swallow evaluations during previous admissions.  FEES 03/31/16 with recs for dysphagia 2, thins. No penetration/aspiration noted, but with increased RR, pt had decreased coordination and bolus loss; backflow from cervical esophagus also observed, suggesting definite esophageal component.    Assessment / Plan / Recommendation Clinical Impression  Pt presents with improved swallow function since last assessed in February of this year.  Upon entering room, she was sitting at EOB feeding herself dinner.  Mastication continues to be mildly prolonged, but it is functional.  There were no overt s/s of aspiration, despite mixed solid/liquid consistency consumption and with 3 oz water test.  No issues with respiratory/swallow coordination.  Given findings of potential esophageal deficits per Feb FEES, pt may benefit from further esophageal w/u at some point.   No further SLP f/u warranted - our services will sign off.     SLP Visit Diagnosis: Dysphagia, unspecified (R13.10)    Aspiration Risk       Diet Recommendation   regular solids, thin liquids.  Medication Administration: Whole meds with puree      Swallow Study   General Date of Onset: 04/23/16 HPI: Pt admitted with encephalopathy presumed due to polypharmacy. PMH: recurrent PNA, chronic back pain, BPPV, morbid obesity,schizophrenia,  secondary Parkinsonism.[name]  Has had swallow evaluations during previous admissions.  FEES 03/31/16 with recs for dysphagia 2, thins. Type of Study: Bedside Swallow Evaluation  Previous Swallow Assessment: FEES 03/31/16: Pt demonstrates a primary respiratory based dysphagia, observable during this procedure as pt was very anxious with a RR in mid 30s during FEES due to fear with testing, almost hyperventilating.  Oral and oropharyngeal function were WNL with good airway protection and strength.  Initially the swallow wastimely, but as inability to tolerate apnea increased, liquids spilled to valleculae or pyriforms prior to the swallow. Still, even in distress no penetration or aspiration occurred, though risk will be present with PO intake. Late coughingwas observed, as well as mild backflow from cervical esophagus. Suspect coughing associated with esophageal stasis or reflux. Pt to initiate a dys 2/thin liquid diet with small sips.  Diet Prior to this Study: Regular;Thin liquids Temperature Spikes Noted: Yes Respiratory Status: Room air History of Recent Intubation: No Behavior/Cognition: Alert;Cooperative Oral Cavity Assessment: Within Functional Limits Oral Care Completed by SLP: No Oral Cavity - Dentition: Adequate natural dentition Vision: Functional for self-feeding Self-Feeding Abilities: Able to feed self Patient Positioning: Upright in bed Baseline Vocal Quality: Normal Volitional Cough: Strong Volitional Swallow: Able to elicit    Oral/Motor/Sensory Function Overall Oral Motor/Sensory Function: Within functional limits   Ice Chips Ice chips: Not tested   Thin Liquid Thin Liquid: Within functional limits    Nectar Thick Nectar Thick Liquid: Not tested   Honey Thick Honey Thick Liquid: Not tested   Puree Puree: Within functional limits   Solid   GO   Solid: Within functional limits        Juan Quam Laurice 04/24/2016,5:18 PM

## 2016-04-24 NOTE — Progress Notes (Signed)
PROGRESS NOTE        PATIENT DETAILS Name: [name] Age: 61 y.o. Sex: female Date of Birth: May 29, 1955 Admit Date: 04/23/2016 Admitting Physician Evalee Mutton Kristeen Mans, MD KDX:[id number] [name], MD  Brief Narrative: Patient is a 61 y.o. female with past medical history of type 2 diabetes, neuropathy, chronic back pain on narcotics, recent admission for presumed polypharmacy-readmitted on 3/7 for acute toxic encephalopathy probably due to polypharmacy. See below for further details  Subjective: Much more awake and alert. She did not take medications as yesterday, but claims she took her usual medications on Saturday. Although much awake and alert, she is still a very poor historian.  Assessment/Plan: Acute toxic encephalopathy: Resolved, thought to be secondary to polypharmacy. CT head negative on admission. Have consulted social work, to see if patient can qualify for longer term SNF placement.  Acute kidney injury: Likely hemodynamic mediated acute kidney injury, being with IV fluids.  Hyponatremia: likely hypotonic hypovolemic hyponatremia-improving with IV fluids.  Hypokalemia: Replete and recheck.  Hypertension: Blood pressure currently controlled without the use of any antihypertensives. Follow.  Hypothyroidism: Continue Synthroid  GERD: Continue PPI  Type 2 diabetes: CBGs relatively controlled with SSI-we will resume basal insulin when more stable.  Chronic back pain/peripheral neuropathy/chronic pain syndrome: Continue to hold all narcotics-currently no signs of any withdrawal symptoms-given issues with a polypharmacy-poor long-term candidate for a narcotics. May need to be slowly weaned off at some point.We will resume Lyrica-if needed can add Cymbalta. Have asked social worker to evaluate to see if she is a candidate for longer term placement. This patient was discharged discharge from SNF this past Saturday.  History of schizophrenia/bipolar  disorder: Continue to hold Geodon, will consult psychiatry-for med management-given recurrent issue with presume polypharmacy  OSA: Resume CPAP daily at bedtime-as mentation is much improved.  DVT Prophylaxis: Prophylactic Lovenox   Code Status: Full code   Family Communication: None at bedside  Disposition Plan: Remain inpatient-but will plan on Home health vs SNF on discharge-await SW eval  Antimicrobial agents: Anti-infectives    None      Procedures: None  CONSULTS:  None  Time spent: 25 minutes-Greater than 50% of this time was spent in counseling, explanation of diagnosis, planning of further management, and coordination of care.  MEDICATIONS: Scheduled Meds: . enoxaparin (LOVENOX) injection  40 mg Subcutaneous Daily  . insulin aspart  0-9 Units Subcutaneous Q4H  . sodium chloride flush  3 mL Intravenous Q12H   Continuous Infusions: . sodium chloride 75 mL/hr at 04/24/16 0413   PRN Meds:.acetaminophen **OR** acetaminophen, albuterol, ondansetron **OR** ondansetron (ZOFRAN) IV   PHYSICAL EXAM: Vital signs: Vitals:   04/23/16 2015 04/23/16 2100 04/23/16 2200 04/24/16 0350  BP: 125/75 124/91 119/92 (!) 123/51  Pulse: 96 94 97 98  Resp: _0 Temp:    97.1 F (36.2 C)  TempSrc:    Oral  SpO2: 98% 100% 100% 97%  Weight:    (!) 140.4 kg (309 lb 9.6 oz)  Height:    _1  (1.651 m)   Filed Weights   04/24/16 0350  Weight: (!) 140.4 kg (309 lb 9.6 oz)   Body mass index is 51.52 kg/m.   General appearance :Awake, alert, not in any distress. Speech Clear.  Eyes:, pupils equally reactive to light and accomodation,no scleral icterus. HEENT: Atraumatic and  Normocephalic Neck: supple, no JVD. No cervical lymphadenopathy. No thyromegaly Resp:Good air entry bilaterally, no added sounds  CVS: S1 S2 regular, no murmurs.  GI: Bowel sounds present, Non tender and not distended with no gaurding, rigidity or rebound Extremities: B/L Lower Ext shows no  edema, both legs are warm to touch  Neurology:  speech clear,Non focal, sensation is grossly intact. Psychiatric: Normal judgment and insight. Alert and oriented x 3. Normal mood. Musculoskeletal:No digital cyanosis Skin:No Rash, warm and dry Wounds:N/A  I have personally reviewed following labs and imaging studies  LABORATORY DATA: CBC:  Recent Labs Lab 04/23/16 1429 04/23/16 1450 04/24/16 0456  WBC 10.9*  --  7.9  NEUTROABS 6.9  --   --   HGB 9.6* 10.5* 9.0*  HCT 29.6* 31.0* 27.5*  MCV 80.2  --  80.4  PLT 301  --  353    Basic Metabolic Panel:  Recent Labs Lab 04/23/16 1429 04/23/16 1450 04/24/16 0456  NA 124* 124* 129*  K 3.6 3.7 3.4*  CL 89* 89* 94*  CO2 22  --  24  GLUCOSE 113* 112* 103*  BUN 28* 27* 24*  CREATININE 2.02* 2.10* 1.82*  CALCIUM 8.7*  --  8.8*    GFR: Estimated Creatinine Clearance: 47.5 mL/min (by C-G formula based on SCr of 1.82 mg/dL (H)).  Liver Function Tests:  Recent Labs Lab 04/23/16 1429  AST 19  ALT 17  ALKPHOS 60  BILITOT 0.6  PROT 6.1*  ALBUMIN 3.3*   No results for input(s): LIPASE, AMYLASE in the last 168 hours. No results for input(s): AMMONIA in the last 168 hours.  Coagulation Profile:  Recent Labs Lab 04/23/16 1429  INR 1.06    Cardiac Enzymes: No results for input(s): CKTOTAL, CKMB, CKMBINDEX, TROPONINI in the last 168 hours.  BNP (last 3 results) No results for input(s): PROBNP in the last 8760 hours.  HbA1C: No results for input(s): HGBA1C in the last 72 hours.  CBG:  Recent Labs Lab 04/24/16 0156 04/24/16 0429 04/24/16 0827  GLUCAP 95 103* 94    Lipid Profile: No results for input(s): CHOL, HDL, LDLCALC, TRIG, CHOLHDL, LDLDIRECT in the last 72 hours.  Thyroid Function Tests: No results for input(s): TSH, T4TOTAL, FREET4, T3FREE, THYROIDAB in the last 72 hours.  Anemia Panel: No results for input(s): VITAMINB12, FOLATE, FERRITIN, TIBC, IRON, RETICCTPCT in the last 72 hours.  Urine  analysis:    Component Value Date/Time   COLORURINE YELLOW 04/23/2016 2248   APPEARANCEUR HAZY (A) 04/23/2016 2248   APPEARANCEUR Clear 11/13/2013 1310   LABSPEC 1.005 04/23/2016 2248   LABSPEC 1.017 11/13/2013 1310   PHURINE 5.0 04/23/2016 2248   GLUCOSEU NEGATIVE 04/23/2016 2248   GLUCOSEU Negative 11/13/2013 1310   GLUCOSEU NEG mg/dL 03/02/2006 2050   HGBUR SMALL (A) 04/23/2016 2248   BILIRUBINUR NEGATIVE 04/23/2016 2248   BILIRUBINUR Negative 11/13/2013 1310   KETONESUR NEGATIVE 04/23/2016 2248   PROTEINUR NEGATIVE 04/23/2016 2248   UROBILINOGEN 0.2 11/12/2014 2057   NITRITE NEGATIVE 04/23/2016 2248   LEUKOCYTESUR SMALL (A) 04/23/2016 2248   LEUKOCYTESUR Trace 11/13/2013 1310    Sepsis Labs: Lactic Acid, Venous    Component Value Date/Time   LATICACIDVEN 0.9 03/28/2016 0430    MICROBIOLOGY: No results found for this or any previous visit (from the past 240 hour(s)).  RADIOLOGY STUDIES/RESULTS: Ct Abdomen Pelvis Wo Contrast  Result Date: 03/28/2016 CLINICAL DATA:  Right flank pain EXAM: CT ABDOMEN AND PELVIS WITHOUT CONTRAST TECHNIQUE: Multidetector CT imaging of the  abdomen and pelvis was performed following the standard protocol without IV contrast. COMPARISON:  06/23/2015 FINDINGS: Lower chest: Mild ground-glass densities in the left greater than right posterior lower lobes. No effusion. The heart appears borderline enlarged. Hepatobiliary: No focal liver abnormality is seen. Status post cholecystectomy. No biliary dilatation. Pancreas: Unremarkable. No pancreatic ductal dilatation or surrounding inflammatory changes. Spleen: Normal in size without focal abnormality. Adrenals/Urinary Tract: Adrenal glands are unremarkable. Kidneys are normal, without renal calculi, focal lesion, or hydronephrosis. Bladder is unremarkable. Nonspecific perinephric fat stranding. Stomach/Bowel: Stomach is within normal limits. Appendix contains a few stones but is otherwise normal. No evidence  of bowel wall thickening, distention, or inflammatory changes. Large amount of stool in the right and transverse colon. Vascular/Lymphatic: Aortic atherosclerosis. No enlarged abdominal or pelvic lymph nodes. Reproductive: Status post hysterectomy. No adnexal masses. Other: Small fat containing umbilical hernia. No free air or free fluid. Musculoskeletal: Degenerative changes. No acute or suspicious bone lesions. IMPRESSION: 1. Negative for nephrolithiasis hydronephrosis or ureteral stone. 2. Mild density in the left greater than right posterior lower lobes could reflect pneumonia or possible aspiration. Electronically Signed   By: Donavan Foil M.D.   On: 03/28/2016 01:13   Dg Chest 2 View  Result Date: 03/29/2016 CLINICAL DATA:  Difficulty breathing, hypoxia EXAM: CHEST  2 VIEW COMPARISON:  03/27/2016 FINDINGS: Increased interstitial markings. Mild patchy lingular/left lower lobe opacity, pneumonia not excluded. No pleural effusion or pneumothorax. The heart is top-normal in size. Surgical clips overlying the right neck. IMPRESSION: Mild patchy lingular/left lower lobe opacity, pneumonia not excluded. Increased interstitial markings without frank interstitial edema. Electronically Signed   By: Julian Hy M.D.   On: 03/29/2016 17:54   Dg Chest 2 View  Result Date: 03/27/2016 CLINICAL DATA:  Sepsis EXAM: CHEST  2 VIEW COMPARISON:  02/29/2016 FINDINGS: Low lung volumes. Borderline cardiomegaly. No aortic aneurysm. Mild bilateral interstitial edema. Possible superimposed pneumonia at each lung base left greater than right given slightly more confluent airspace opacities noted. Surgical clips are seen at the base the neck as before. No sizable effusion. No pneumothorax identified. No acute osseous abnormality. Small calcification about the right shoulder may reflect calcific tendinopathy. IMPRESSION: Low lung volumes with borderline cardiomegaly and interstitial edema. Subtle airspace opacities at the  lung bases left greater than right cannot exclude superimposed pneumonia. Electronically Signed   By: Ashley Royalty M.D.   On: 03/27/2016 18:11   Dg Abd 1 View  Result Date: 04/02/2016 CLINICAL DATA:  Nausea and vomiting starting yesterday EXAM: ABDOMEN - 1 VIEW COMPARISON:  04/01/2016 FINDINGS: Mild gaseous distended small bowel loops are noted in left mid abdomen suspicious for ileus, enteritis or early bowel obstruction. Some colonic gas noted in right colon and transverse colon. Colonic gas noted within rectum. IMPRESSION: Mild gaseous distended small bowel loops in left mid abdomen suspicious for mild ileus enteritis or less likely small bowel obstruction. Electronically Signed   By: Lahoma Crocker M.D.   On: 04/02/2016 10:36   Ct Head Wo Contrast  Result Date: 04/01/2016 CLINICAL DATA:  Acute onset of altered mental status. Initial encounter. EXAM: CT HEAD WITHOUT CONTRAST TECHNIQUE: Contiguous axial images were obtained from the base of the skull through the vertex without intravenous contrast. COMPARISON:  CT of the head performed 03/27/2016, and MRI of the brain performed 02/29/2016 FINDINGS: Brain: No evidence of acute infarction, hemorrhage, hydrocephalus, extra-axial collection or mass lesion/mass effect. Prominence of the ventricles and sulci reflects mild cortical volume loss. Mild cerebellar atrophy  is noted. The brainstem and fourth ventricle are within normal limits. The basal ganglia are unremarkable in appearance. The cerebral hemispheres demonstrate grossly normal gray-white differentiation. No mass effect or midline shift is seen. Vascular: No hyperdense vessel or unexpected calcification. Skull: There is no evidence of fracture; visualized osseous structures are unremarkable in appearance. A mildly prominent empty sella is noted. Sinuses/Orbits: The orbits are within normal limits. The paranasal sinuses and mastoid air cells are well-aerated. Other: No significant soft tissue abnormalities  are seen. Prominent cerumen is seen at the external auditory canals bilaterally, larger on the right. IMPRESSION: 1. No acute intracranial pathology seen on CT. 2. Mild cortical volume loss noted. 3. Prominent cerumen at the external auditory canals bilaterally. Electronically Signed   By: Garald Balding M.D.   On: 04/01/2016 21:53   Ct Head Wo Contrast  Result Date: 03/27/2016 CLINICAL DATA:  Multiple falls. EXAM: CT HEAD WITHOUT CONTRAST TECHNIQUE: Contiguous axial images were obtained from the base of the skull through the vertex without intravenous contrast. COMPARISON:  02/29/2016 FINDINGS: Brain: There is no evidence for acute hemorrhage, hydrocephalus, mass lesion, or abnormal extra-axial fluid collection. No definite CT evidence for acute infarction. Vascular: No hyperdense vessel or unexpected calcification. Skull: Normal. Negative for fracture or focal lesion. Sinuses/Orbits: Visualized portions of the paranasal sinuses are clear. Right mastoid air cells are clear. Fluid is visible in the left mastoid air cells. Visualized portions of the globes and intraorbital fat are unremarkable. Other: None. IMPRESSION: 1. No acute intracranial abnormality. 2. Left mastoid effusion. Electronically Signed   By: Misty Stanley M.D.   On: 03/27/2016 17:50   Ct Abdomen Pelvis W Contrast  Result Date: 04/02/2016 CLINICAL DATA:  Pneumonia, altered mental status.  Abdominal pain. EXAM: CT ABDOMEN AND PELVIS WITH CONTRAST TECHNIQUE: Multidetector CT imaging of the abdomen and pelvis was performed using the standard protocol following bolus administration of intravenous contrast. CONTRAST:  100 cc Isovue 300 IV COMPARISON:  Same day KUB, CT from 03/28/2016 FINDINGS: Lower chest: Small pleural effusions bilaterally slightly increased since prior exam with adjacent atelectasis. Borderline cardiomegaly. No pericardial effusion. Hepatobiliary: Cholecystectomy. No focal liver lesion or biliary dilatation. Pancreas: Fatty  atrophy of the pancreas without focal mass or ductal dilatation. No surrounding peripancreatic inflammation. Spleen: Normal size without focal abnormality. Adrenals/Urinary Tract: Normal bilateral adrenal glands and kidneys without obstructive uropathy. Physiologically distended appearance of the bladder. Stomach/Bowel: Contracted stomach. Normal small bowel rotation. Mild jejunal thickening without significant fluid-filled distention. Enteric contrast passes into large bowel. No evidence acute appendicitis. Vascular/Lymphatic: Aortic atherosclerosis. No enlarged abdominal or pelvic lymph nodes. Reproductive: Status post hysterectomy. No adnexal masses. Other: Small fat containing umbilical hernia. No free air nor free fluid. Left lower quadrant iatrogenic subcutaneous emphysema likely related injection subcutaneous medication. Musculoskeletal: Degenerate changes of the dorsal spine. No acute nor suspicious osseous lesions. IMPRESSION: 1. Moderate diffuse thickening of jejunum since prior exam suspicious for enteritis. No mechanical bowel obstruction or pneumatosis. 2. Slight increase in small bilateral pleural effusions. Bibasilar atelectasis without pneumonic consolidations. 3. Small fat containing umbilical hernia. Electronically Signed   By: Ashley Royalty M.D.   On: 04/02/2016 22:51   Nm Pulmonary Perf And Vent  Result Date: 03/29/2016 CLINICAL DATA:  Acute respiratory failure, hypoxia, cough EXAM: NUCLEAR MEDICINE VENTILATION - PERFUSION LUNG SCAN TECHNIQUE: Ventilation images were obtained in multiple projections using inhaled aerosol Tc-28mDTPA. Perfusion images were obtained in multiple projections after intravenous injection of Tc-957mAA. RADIOPHARMACEUTICALS:  32.5 mCi Technetium-9950mPA  aerosol inhalation and 4.32 mCi Technetium-14mMAA IV COMPARISON:  None. FINDINGS: Ventilation: Mildly heterogeneous.  No focal ventilation defect. Perfusion: No wedge shaped peripheral perfusion defects to suggest  acute pulmonary embolism. IMPRESSION: Negative for pulmonary embolism. Electronically Signed   By: SJulian HyM.D.   On: 03/29/2016 17:50   Dg Abd Portable 1v  Result Date: 04/01/2016 CLINICAL DATA:  Vomiting episodes.  Abdominal pain . EXAM: PORTABLE ABDOMEN - 1 VIEW COMPARISON:  CT 03/28/2016. FINDINGS: Soft tissue structures are unremarkable. Surgical clips right upper quadrant. No bowel distention. No free air. Calcifications in pelvis consistent phleboliths. Degenerative changes lumbar spine and both hips. IMPRESSION: No acute or focal abnormality. Electronically Signed   By: TMarcello Moores Register   On: 04/01/2016 12:18   Ct Head Code Stroke W/o Cm  Result Date: 04/23/2016 CLINICAL DATA:  Code stroke. Acute onset of slurred speech and weakness. EXAM: CT HEAD WITHOUT CONTRAST TECHNIQUE: Contiguous axial images were obtained from the base of the skull through the vertex without intravenous contrast. COMPARISON:  CT head without contrast 04/01/2016 FINDINGS: Brain: No acute infarct, hemorrhage, or mass lesion is present. The ventricles are of normal size. No significant extraaxial fluid collection is present. No significant white matter disease is present. The brainstem and cerebellum are within normal limits. Vascular: No hyperdense vessel or unexpected calcification. Skull: The calvarium is within normal limits. No focal lytic or blastic lesions are present. Sinuses/Orbits: The paranasal sinuses are clear. Debris in the external auditory canals is again noted. Go ASPECTS [facility]) - Ganglionic level infarction (caudate, lentiform nuclei, internal capsule, insula, M1-M3 cortex): 7/7 - Supraganglionic infarction (M4-M6 cortex): 3/3 Total score (0-10 with 10 being normal): 10/10 IMPRESSION: 1. Normal CT appearance of the brain. No acute or focal lesion to explain abnormal speech or weakness. 2. ASPECTS is 10/10 Electronically Signed   By: CSan MorelleM.D.   On:  04/23/2016 14:20     LOS: 1 day   GOren Binet MD  [facility] Pager:[phone] If 7PM-7AM, please contact night-coverage [email or web address] Password [email or web address] [facility]3/09/2016, 10:42 AM

## 2016-04-24 NOTE — Evaluation (Signed)
Physical Therapy Evaluation Patient Details Name: [name] MRN: [id number] DOB: 06/19/1955 Today's Date: 04/24/2016   History of Present Illness  Pt admitted with encephalopathy presumed due to polypharmacy. PMH: recurrent PNA, chronic back pain, BPPV, morbid obesity,schizophrenia, secondary Parkinsonism.  Clinical Impression  Pt lethargic on arrival, easily drifting off to sleep but with stimulation pt able to maintain arousal, cognition impaired with pt stating [place] and [place] asked where [place] Pt with 2 person assist for mobility, unable to safely step or transfer and not safe to be home alone with SNF recommended. Pt with decreased strength, function, mobility, balance and cognition who will benefit from acute therapy to maximize mobility, strength, gait and function.      Follow Up Recommendations SNF;Supervision/Assistance - 24 hour    Equipment Recommendations  None recommended by PT    Recommendations for Other Services       Precautions / Restrictions Precautions Precautions: Fall      Mobility  Bed Mobility Overal bed mobility: Needs Assistance Bed Mobility: Supine to Sit     Supine to sit: Min assist     General bed mobility comments: assist to rise from surface with increased time  Transfers Overall transfer level: Needs assistance   Transfers: Sit to/from Stand;Stand Pivot Transfers Sit to Stand: Min assist Stand pivot transfers: Mod assist       General transfer comment: min assist to stand from bedx 2 and  BSC with cues for hand placement, assist for rise and pt unable to extend trunk fully in standing with cues for looking up. Pt pivoted bed to [facility] with mod assist and RW with transition from [facility] to recliner pt sitting prior to being fully at surface with max assist to descend to chair with cues for safety and sequence throughout  Ambulation/Gait             General Gait Details: unsafe to  attempt  Stairs            Wheelchair Mobility    Modified Rankin (Stroke Patients Only)       Balance Overall balance assessment: Needs assistance   Sitting balance-Leahy Scale: Fair       Standing balance-Leahy Scale: Poor                               Pertinent Vitals/Pain Pain Assessment: 0-10 Pain Score: 8  Pain Location: back Pain Descriptors / Indicators: Moaning;Grimacing Pain Intervention(s): Limited activity within patient's tolerance;Repositioned;Patient requesting pain meds-RN notified    Home Living Family/patient expects to be discharged to:: Private residence Living Arrangements: Alone Available Help at Discharge: Family;Available PRN/intermittently;Personal care attendant Type of Home: House Home Access: Stairs to enter Entrance Stairs-Rails: None Entrance Stairs-Number of Steps: 1 Home Layout: One level Home Equipment: Cane - single point;Shower seat;Walker - 4 wheels;Bedside commode Additional Comments: States her children assist her with groceries, shopping, aide 7 days/wk for 2 hrs    Prior Function        ADL's / Homemaking Assistance Needed: pt reported that caregiver assists with bathing and dressing, meal prep and houskeeping        Hand Dominance        Extremity/Trunk Assessment   Upper Extremity Assessment Upper Extremity Assessment: Generalized weakness    Lower Extremity Assessment Lower Extremity Assessment: Generalized weakness    Cervical / Trunk Assessment Cervical / Trunk Assessment:  Other exceptions;Kyphotic Cervical / Trunk Exceptions: chronic back pain, maintains hip flexion and trunk flexion in standing  Communication      Cognition Arousal/Alertness: Lethargic Behavior During Therapy: Flat affect Overall Cognitive Status: Impaired/Different from baseline Area of Impairment: Problem solving;Following commands;Safety/judgement;Attention   Current Attention Level: Sustained   Following  Commands: Follows one step commands with increased time Safety/Judgement: Decreased awareness of deficits;Decreased awareness of safety   Problem Solving: Slow processing;Decreased initiation General Comments: able to follow commands with increased time for initiating, drowsy with decreased attention and drifting off while supine and sitting    General Comments      Exercises     Assessment/Plan    PT Assessment Patient needs continued PT services  PT Problem List Decreased strength;Decreased activity tolerance;Decreased balance;Decreased mobility;Obesity;Pain;Decreased safety awareness;Decreased knowledge of use of DME       PT Treatment Interventions DME instruction;Gait training;Functional mobility training;Therapeutic activities;Therapeutic exercise;Balance training;Patient/family education    PT Goals (Current goals can be found in the Care Plan section)  Acute Rehab PT Goals Patient Stated Goal: walk PT Goal Formulation: With patient Time For Goal Achievement: 05/08/16 Potential to Achieve Goals: Fair    Frequency Min 3X/week   Barriers to discharge Inaccessible home environment;Decreased caregiver support      Co-evaluation               End of Session Equipment Utilized During Treatment: Gait belt Activity Tolerance: Patient tolerated treatment well Patient left: in chair;with call bell/phone within reach;with chair alarm set Nurse Communication: Mobility status;Precautions PT Visit Diagnosis: Unsteadiness on feet (R26.81);Difficulty in walking, not elsewhere classified (R26.2);Pain;Muscle weakness (generalized) (M62.81)         Time: 3582-5189 PT Time Calculation (min) (ACUTE ONLY): 21 min   Charges:   PT Evaluation $PT Eval Moderate Complexity: 1 Procedure     PT G Codes:         Awanda Wilcock B [employer] 2016-05-18, 1:20 PM  Elwyn Reach, [place]

## 2016-04-24 NOTE — ED Notes (Signed)
[facility] blood draw,  Notified nurse

## 2016-04-24 NOTE — Consult Note (Signed)
[facility] Consult   Reason for Consult:  Encephalopathy, history of Schizophrenia  Referring Physician:   Dr. Sloan Leiter Patient Identification: [name] MRN:  [id number] Principal Diagnosis: Encephalopathy acute Diagnosis:   Patient Active Problem List   Diagnosis Date Noted  . Encephalopathy acute [G93.40] 04/23/2016  . Hyponatremia [E87.1] 04/23/2016  . 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 Aspiration pneumonia of left lower lobe due to gastric secretions (HCC) [J69.0]   . Projectile vomiting with nausea [R11.12]   . Altered mental status [R41.82]   . Schizophrenia ([place]) [F20.9]   . 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 Chronic pain syndrome [G89.4]   . Acute renal failure ([place]) [N17.9]   . Controlled diabetes mellitus type 2 with complications ([place]) [M84.1]   . Acute encephalopathy [G93.40]   . Oropharyngeal dysphagia [R13.12]   . Aspiration pneumonia of right lower lobe due to vomit ([place]) [J69.0]   . Acute renal failure (ARF) ([place]) [N17.9] 03/28/2016  . Encephalopathy [G93.40] 03/27/2016  . HCAP (healthcare-associated pneumonia) [J18.9] 02/29/2016  . Pneumonia [J18.9] 02/14/2016  . CAP (community acquired pneumonia) [J18.9] 02/13/2016  . Edema [R60.9] 02/13/2016  . AKI (acute kidney injury) ([place]) [N17.9] 02/13/2016  . Elevated troponin [R74.8] 06/24/2015  . Diarrhea [R19.7] 06/23/2015  . Sepsis ([place]) [A41.9] 06/23/2015  . Obesity, morbid, BMI 50 or higher ([place]) [E66.01] 03/26/2015  . Post traumatic stress disorder (PTSD) [F43.10] 11/14/2014  . Leukocytosis [D72.829] 11/12/2014  . Slurred speech [R47.81] 11/12/2014  . CKD (chronic kidney disease) stage 3, GFR 30-59 ml/min [N18.3]   . Diabetes mellitus, type II, insulin dependent (HCC) [E11.9, Z79.4]   . TIA (transient ischemic attack) [G45.9]   . Hypotension [I95.9] 10/28/2014  . Syncope [R55] 10/28/2014  . Anemia [D64.9] 10/28/2014  . Type II diabetes mellitus ([place]) [E11.9] 10/28/2014   . OSA on CPAP [G47.33, Z99.89] 10/28/2014  . Chronic lower back pain [M54.5, G89.29] 10/28/2014  . Polyneuropathy in diabetes(357.2) [E11.42] 10/28/2014  . Diabetic polyneuropathy ([place]) [E11.42]   . Fall [W19.XXXA] 08/14/2014  . Recurrent falls [R29.6] 08/14/2014  . Hot flashes [R23.2] 02/24/2014  . Arthralgia [M25.50] 02/24/2014  . Hair loss [L65.9] 02/24/2014  . Chest pain [R07.9] 01/06/2014  . Shock ([place]) [R57.9] 08/08/2013  . 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 Dysarthria [R47.1] 12/30/2012  . Benign paroxysmal positional vertigo [H81.10] 12/30/2012  . Breast cancer of upper-outer quadrant of right female breast ([place]) [C50.411] 11/18/2012  . Hemorrhage of rectum and anus [K62.5] 09/28/2012  . Hx of radiation therapy [Z92.3]   . Syncope and collapse [R55] 06/04/2011  . HSV [B00.9] 10/05/2008  . Asthma [J45.909] 06/02/2008  . HLD (hyperlipidemia) [E78.5] 06/02/2006  . Sinus tachycardia [R00.0] 05/25/2006  . Hypothyroidism [E03.9] 02/27/2006  . Obesity [E66.9] 02/27/2006  . Depression [F32.9] 02/27/2006  . Essential hypertension [I10] 02/27/2006  . GERD [K21.9] 02/27/2006  . KNEE PAIN [M25.569] 02/27/2006    Total Time spent with patient: 30 minutes  Subjective:   [name] is a 61 y.o. female patient admitted with mental status changes   HPI:  Patient is a 61 year old female, who had been discharged from [facility] to SNF on 2/19. Was discharged home from SNF a few days prior to this admission. Patient developed worsening drowsiness, lethargia, confusion, due to which she was readmitted.  I have reviewed presentation with RN staff- patient remains drowsy, but awakens  on calling her name, not agitated. At this time patient is drowsy, but awakens easily,  she is partially oriented to person,  [place],  to March, 2018. She states her memory of events leading to this admission is limited. At this time denies any acute or significant  psychiatric symptoms- denies  feeling depressed, denies any suicidal or violent ideations, denies any  hallucinations, and does not appear internally preoccupied .   Labs are remarkable for hyponatremia, anemia. UDS positive for opiates and BZDs  Past Psychiatric History: as per chart patient has been diagnosed with Schizophrenia. Patient states she has not had prior psychiatric admissions, denies history of suicide attempts, also denies any history of drug or alcohol abuse.  Risk to Self: Is patient at risk for suicide?: No Risk to Others:   Prior Inpatient Therapy:   Prior Outpatient Therapy:    Past Medical History:  Past Medical History:  Diagnosis Date  . Anemia   . Anxiety   . Arthritis    "pretty much all my joints"  . Asthma   . Benign paroxysmal positional vertigo 12/30/2012  . Breast cancer ([place]) 02/13/12   ruq  100'clock bx Ductal Carcinoma in Situ,(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  . Breast mass in female    L breast 2008, US showed likely fat necrosis  . Chronic lower back pain   . CKD (chronic kidney disease), stage III    "lower stage" (01/06/2014)  . Daily headache   . Depression   . GERD (gastroesophageal reflux disease)   . History of blood transfusion 2011   "after one of my OR's"  . History of hiatal hernia   . History of stomach ulcers   . HSV (herpes simplex virus) infection   . Hx of radiation therapy 05/05/12- 07/15/12   right breast, 45 gray x 25 fx, lumpectomy cavity boosted to 16.2 gray  . Hyperlipemia   . Hypertension    sees Dr. Criss Rosales , [facility]  . Hypothyroidism   . Kidney stones   . Knee pain, bilateral   . Migraines    "~ 3 times/month" (01/06/2014)  . Obesity   . OSA on CPAP    pt does not know settings  . Pancreatitis    hx of  . Pneumonia 1950's  . Polyneuropathy in diabetes(357.2)   . Schizophrenia ([place])   . 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 Tachycardia    with sx in 2008  . Thyroid disease   . Type II diabetes mellitus  ([place])     Past Surgical History:  Procedure Laterality Date  . ABDOMINAL HYSTERECTOMY  1979?   partial  . BREAST LUMPECTOMY  03/03/2012   Procedure: LUMPECTOMY;  Surgeon: Haywood Lasso, MD;  Location: [facility];  Service: General;  Laterality: Right;  . BREAST SURGERY Right 01/2012   "cancer"  . CHOLECYSTECTOMY  1980's?  . COLONOSCOPY WITH PROPOFOL N/A 09/28/2012   Procedure: COLONOSCOPY WITH PROPOFOL;  Surgeon: Lear Ng, MD;  Location: [facility];  Service: Endoscopy;  Laterality: N/A;  . FOOT FRACTURE SURGERY Right 1990's  . JOINT REPLACEMENT    . REVISION TOTAL KNEE ARTHROPLASTY  07/2009  . SHOULDER OPEN ROTATOR CUFF REPAIR Left 1990's  . THYROIDECTOMY  1970's  . TOTAL KNEE ARTHROPLASTY Bilateral 2009-06/2009   left; right   Family History:  Family History  Problem Relation Age of Onset  . Heart disease Brother     Multiple MIs, starting in his 34s  . Diabetes Brother   . Hypertension Mother   . Diabetes Mother   . Breast cancer Mother  63  . Bone cancer Mother   . Hypertension Sister   . Diabetes Sister   . Breast cancer Sister 79  . Breast cancer Maternal Grandmother   . Heart disease Maternal Grandmother   . Uterine cancer Other 19  . Breast cancer Paternal Aunt 36  . Breast cancer Paternal Grandmother     dx in her 100s  . Prostate cancer Paternal Grandfather    Family Psychiatric  History:  Non contributory  Social History:  History  Alcohol Use No    Comment: "stopped drinking in 1996; I was an alcoholic"     History  Drug Use No    Social History   Social History  . Marital status: Divorced    Spouse name: N/A  . Number of children: 2  . Years of education: 13   Occupational History  . Disabled    Social History Main Topics  . Smoking status: Former Smoker    Packs/day: 1.00    Years: 7.00    Types: Cigarettes    Quit date: 03/24/1992  . Smokeless tobacco: Never Used  . Alcohol use No     Comment: "stopped drinking in 1996; I was  an alcoholic"  . Drug use: No  . Sexual activity: No   Other Topics Concern  . None   Social History Narrative   Single. Disabled Biochemist, clinical.       Patient does not drink caffeine.   Patient is right handed.    Additional Social History:    Allergies:   Allergies  Allergen Reactions  . Bee Venom Anaphylaxis  . Shellfish Allergy Anaphylaxis  . Buprenorphine Hcl Other (See Comments)    Overly sedated    Labs:  Results for orders placed or performed during the hospital encounter of 04/23/16 (from the past 48 hour(s))  Ethanol     Status: None   Collection Time: 04/23/16  2:29 PM  Result Value Ref Range   Alcohol, Ethyl (B) <5 <5 mg/dL    Comment:        LOWEST DETECTABLE LIMIT FOR SERUM ALCOHOL IS 5 mg/dL FOR MEDICAL PURPOSES ONLY   Protime-INR     Status: None   Collection Time: 04/23/16  2:29 PM  Result Value Ref Range   Prothrombin Time 13.9 11.4 - 15.2 seconds   INR 1.06   APTT     Status: None   Collection Time: 04/23/16  2:29 PM  Result Value Ref Range   aPTT 34 24 - 36 seconds  CBC     Status: Abnormal   Collection Time: 04/23/16  2:29 PM  Result Value Ref Range   WBC 10.9 (H) 4.0 - 10.5 K/uL   RBC 3.69 (L) 3.87 - 5.11 MIL/uL   Hemoglobin 9.6 (L) 12.0 - 15.0 g/dL   HCT 29.6 (L) 36.0 - 46.0 %   MCV 80.2 78.0 - 100.0 fL   MCH 26.0 26.0 - 34.0 pg   MCHC 32.4 30.0 - 36.0 g/dL   RDW 16.9 (H) 11.5 - 15.5 %   Platelets 301 150 - 400 K/uL  Differential     Status: None   Collection Time: 04/23/16  2:29 PM  Result Value Ref Range   Neutrophils Relative % 64 %   Neutro Abs 6.9 1.7 - 7.7 K/uL   Lymphocytes Relative 24 %   Lymphs Abs 2.6 0.7 - 4.0 K/uL   Monocytes Relative 7 %   Monocytes Absolute 0.7 0.1 - 1.0 K/uL   Eosinophils  Relative 5 %   Eosinophils Absolute 0.6 0.0 - 0.7 K/uL   Basophils Relative 0 %   Basophils Absolute 0.0 0.0 - 0.1 K/uL  Comprehensive metabolic panel     Status: Abnormal   Collection Time: 04/23/16  2:29 PM  Result  Value Ref Range   Sodium 124 (L) 135 - 145 mmol/L   Potassium 3.6 3.5 - 5.1 mmol/L   Chloride 89 (L) 101 - 111 mmol/L   CO2 22 22 - 32 mmol/L   Glucose, Bld 113 (H) 65 - 99 mg/dL   BUN 28 (H) 6 - 20 mg/dL   Creatinine, Ser 2.02 (H) 0.44 - 1.00 mg/dL   Calcium 8.7 (L) 8.9 - 10.3 mg/dL   Total Protein 6.1 (L) 6.5 - 8.1 g/dL   Albumin 3.3 (L) 3.5 - 5.0 g/dL   AST 19 15 - 41 U/L   ALT 17 14 - 54 U/L   Alkaline Phosphatase 60 38 - 126 U/L   Total Bilirubin 0.6 0.3 - 1.2 mg/dL   GFR calc non Af Amer 26 (L) >60 mL/min   GFR calc Af Amer 30 (L) >60 mL/min    Comment: (NOTE) The eGFR has been calculated using the CKD EPI equation. This calculation has not been validated in all clinical situations. eGFR's persistently <60 mL/min signify possible Chronic Kidney Disease.    Anion gap 13 5 - 15  I-stat troponin, ED (not at [facility], [facility])     Status: None   Collection Time: 04/23/16  2:47 PM  Result Value Ref Range   Troponin i, poc 0.00 0.00 - 0.08 ng/mL   Comment 3            Comment: Due to the release kinetics of cTnI, a negative result within the first hours of the onset of symptoms does not rule out myocardial infarction with certainty. If myocardial infarction is still suspected, repeat the test at appropriate intervals.   I-Stat Chem 8, ED  (not at [facility], [facility])     Status: Abnormal   Collection Time: 04/23/16  2:50 PM  Result Value Ref Range   Sodium 124 (L) 135 - 145 mmol/L   Potassium 3.7 3.5 - 5.1 mmol/L   Chloride 89 (L) 101 - 111 mmol/L   BUN 27 (H) 6 - 20 mg/dL   Creatinine, Ser 2.10 (H) 0.44 - 1.00 mg/dL   Glucose, Bld 112 (H) 65 - 99 mg/dL   Calcium, Ion 1.07 (L) 1.15 - 1.40 mmol/L   TCO2 24 0 - 100 mmol/L   Hemoglobin 10.5 (L) 12.0 - 15.0 g/dL   HCT 31.0 (L) 36.0 - 46.0 %  Urine rapid drug screen (hosp performed)not at [facility]     Status: Abnormal   Collection Time: 04/23/16 10:47 PM  Result Value Ref Range   Opiates POSITIVE (A) NONE DETECTED   Cocaine NONE DETECTED  NONE DETECTED   Benzodiazepines POSITIVE (A) NONE DETECTED   Amphetamines NONE DETECTED NONE DETECTED   Tetrahydrocannabinol NONE DETECTED NONE DETECTED   Barbiturates NONE DETECTED NONE DETECTED    Comment:        DRUG SCREEN FOR MEDICAL PURPOSES ONLY.  IF CONFIRMATION IS NEEDED FOR ANY PURPOSE, NOTIFY LAB WITHIN 5 DAYS.        LOWEST DETECTABLE LIMITS FOR URINE DRUG SCREEN Drug Class       Cutoff (ng/mL) Amphetamine      1000 Barbiturate      200 Benzodiazepine   474 Tricyclics  300 Opiates          300 Cocaine          300 THC              50   Urinalysis, Routine w reflex microscopic     Status: Abnormal   Collection Time: 04/23/16 10:48 PM  Result Value Ref Range   Color, Urine YELLOW YELLOW   APPearance HAZY (A) CLEAR   Specific Gravity, Urine 1.005 1.005 - 1.030   pH 5.0 5.0 - 8.0   Glucose, UA NEGATIVE NEGATIVE mg/dL   Hgb urine dipstick SMALL (A) NEGATIVE   Bilirubin Urine NEGATIVE NEGATIVE   Ketones, ur NEGATIVE NEGATIVE mg/dL   Protein, ur NEGATIVE NEGATIVE mg/dL   Nitrite NEGATIVE NEGATIVE   Leukocytes, UA SMALL (A) NEGATIVE   RBC / HPF 0-5 0 - 5 RBC/hpf   WBC, UA 0-5 0 - 5 WBC/hpf   Bacteria, UA RARE (A) NONE SEEN   Squamous Epithelial / LPF 0-5 (A) NONE SEEN   Mucous PRESENT    Hyaline Casts, UA PRESENT   CBG monitoring, ED     Status: None   Collection Time: 04/24/16  1:56 AM  Result Value Ref Range   Glucose-Capillary 95 65 - 99 mg/dL  Glucose, capillary     Status: Abnormal   Collection Time: 04/24/16  4:29 AM  Result Value Ref Range   Glucose-Capillary 103 (H) 65 - 99 mg/dL   Comment 1 Notify RN   CBC     Status: Abnormal   Collection Time: 04/24/16  4:56 AM  Result Value Ref Range   WBC 7.9 4.0 - 10.5 K/uL   RBC 3.42 (L) 3.87 - 5.11 MIL/uL   Hemoglobin 9.0 (L) 12.0 - 15.0 g/dL   HCT 27.5 (L) 36.0 - 46.0 %   MCV 80.4 78.0 - 100.0 fL   MCH 26.3 26.0 - 34.0 pg   MCHC 32.7 30.0 - 36.0 g/dL   RDW 16.8 (H) 11.5 - 15.5 %   Platelets 242  150 - 400 K/uL  Basic metabolic panel     Status: Abnormal   Collection Time: 04/24/16  4:56 AM  Result Value Ref Range   Sodium 129 (L) 135 - 145 mmol/L   Potassium 3.4 (L) 3.5 - 5.1 mmol/L   Chloride 94 (L) 101 - 111 mmol/L   CO2 24 22 - 32 mmol/L   Glucose, Bld 103 (H) 65 - 99 mg/dL   BUN 24 (H) 6 - 20 mg/dL   Creatinine, Ser 1.82 (H) 0.44 - 1.00 mg/dL   Calcium 8.8 (L) 8.9 - 10.3 mg/dL   GFR calc non Af Amer 29 (L) >60 mL/min   GFR calc Af Amer 34 (L) >60 mL/min    Comment: (NOTE) The eGFR has been calculated using the CKD EPI equation. This calculation has not been validated in all clinical situations. eGFR's persistently <60 mL/min signify possible Chronic Kidney Disease.    Anion gap 11 5 - 15  Glucose, capillary     Status: None   Collection Time: 04/24/16  8:27 AM  Result Value Ref Range   Glucose-Capillary 94 65 - 99 mg/dL   Comment 1 Notify RN    Comment 2 Document in Chart   Glucose, capillary     Status: Abnormal   Collection Time: 04/24/16 12:47 PM  Result Value Ref Range   Glucose-Capillary 123 (H) 65 - 99 mg/dL   Comment 1 Notify RN    Comment 2  Document in Chart   Glucose, capillary     Status: None   Collection Time: 04/24/16  4:18 PM  Result Value Ref Range   Glucose-Capillary 95 65 - 99 mg/dL   Comment 1 Notify RN    Comment 2 Document in Chart     Current Facility-Administered Medications  Medication Dose Route Frequency Provider Last Rate Last Dose  . 0.9 %  sodium chloride infusion   Intravenous Continuous Jonetta Osgood, MD 50 mL/hr at 04/24/16 1051 50 mL at 04/24/16 1051  . acetaminophen (TYLENOL) tablet 650 mg  650 mg Oral Q6H PRN Jonetta Osgood, MD   650 mg at 04/24/16 1733   Or  . acetaminophen (TYLENOL) suppository 650 mg  650 mg Rectal Q6H PRN Shanker Kristeen Mans, MD      . albuterol (PROVENTIL) (2.5 MG/3ML) 0.083% nebulizer solution 2.5 mg  2.5 mg Nebulization Q2H PRN Shanker Kristeen Mans, MD      . enoxaparin (LOVENOX) injection 40 mg   40 mg Subcutaneous Daily Jonetta Osgood, MD   40 mg at 04/24/16 1015  . insulin aspart (novoLOG) injection 0-9 Units  0-9 Units Subcutaneous Q4H Gardiner Barefoot, NP   1 Units at 04/24/16 1403  . [START ON 04/25/2016] levothyroxine (SYNTHROID, LEVOTHROID) tablet 150 mcg  150 mcg Oral QAC breakfast Shanker Kristeen Mans, MD      . ondansetron [facility]) tablet 4 mg  4 mg Oral Q6H PRN Shanker Kristeen Mans, MD       Or  . ondansetron (ZOFRAN) injection 4 mg  4 mg Intravenous Q6H PRN Shanker Kristeen Mans, MD      . pantoprazole (PROTONIX) EC tablet 40 mg  40 mg Oral Q1200 Jonetta Osgood, MD   40 mg at 04/24/16 1148  . pregabalin (LYRICA) capsule 150 mg  150 mg Oral BID Jonetta Osgood, MD   150 mg at 04/24/16 1148  . sodium chloride flush (NS) 0.9 % injection 3 mL  3 mL Intravenous Q12H Jonetta Osgood, MD   3 mL at 04/24/16 0415    Musculoskeletal: Strength & Muscle Tone: within normal limits Gait & Station: gait not examined  Patient leans: N/A  Psychiatric Specialty Exam: Physical Exam  ROS denies chest pain, denies shortness of breath, denies vomiting  Blood pressure (!) 119/59, pulse (!) 104, temperature 99 F (37.2 C), temperature source Oral, resp. rate 20, height _0  (1.651 m), weight (!) 140.4 kg (309 lb 9.6 oz), SpO2 98 %.Body mass index is 51.52 kg/m.  General Appearance: Fairly Groomed  Eye Contact:  Fair  Speech:  Slow  Volume:  Decreased  Mood:  denies feeling depressed, states mood is "OK"  Affect:  Blunt  Thought Process:  Slowed, but linear   Orientation:  Partially oriented,- oriented to person, partially to place and time  Thought Content:  denies hallucinations, and does not appear internally preoccupied   Suicidal Thoughts:  No denies any suicidal or self injurious ideations  Homicidal Thoughts:  No  Memory:  reports memory of events leading to admission is limited   Judgement:  Fair  Insight:  Fair  Psychomotor Activity:  Decreased- no current restlessness or  agitation   Concentration:  Concentration: Fair and Attention Span: Fair  Recall:  [employer] of Knowledge:  Fair  Language:  Fair  Akathisia:  Negative  Handed:  Right  AIMS (if indicated):     Assets:  Desire for Improvement Resilience  ADL's:  Impaired  Cognition:  Impaired,  Moderate  Sleep:        Treatment Plan Summary:   Disposition: No evidence of imminent risk to self or others at present.   Patient does not meet criteria for psychiatric inpatient admission.   At this time presentation does not appear to be related to psychiatric illness or decompensation. Presentation is suggestive of gradually  Improving delirium likely  related to medications ( BZDs, Opiates), and hyponatremia .  Would minimize or avoid sedating medications , to include BZDs /Opiates,  if possible .   Would monitor for sedation on current Lyrica trial.  Would consider gradually reintroducing psychiatric medications as her cognitive status improves- doses can be further titrated/ optimized on an outpatient basis:  - Geodon at 20 mgrs BID initially- prior to restarting repeat EKG to monitor QTc  - Effexor XR 37.5 mgrs QDAY initially   Discharge disposition should include outpatient psychiatric follow up - please include this in disposition [place], MD 04/24/2016 7:46 PM

## 2016-04-25 DIAGNOSIS — M25562 Pain in left knee: Secondary | ICD-10-CM | POA: Diagnosis not present

## 2016-04-25 DIAGNOSIS — M25561 Pain in right knee: Secondary | ICD-10-CM | POA: Diagnosis not present

## 2016-04-25 DIAGNOSIS — F200 Paranoid schizophrenia: Secondary | ICD-10-CM | POA: Diagnosis not present

## 2016-04-25 DIAGNOSIS — I10 Essential (primary) hypertension: Secondary | ICD-10-CM | POA: Diagnosis not present

## 2016-04-25 DIAGNOSIS — M545 Low back pain: Secondary | ICD-10-CM | POA: Diagnosis not present

## 2016-04-25 DIAGNOSIS — M25512 Pain in left shoulder: Secondary | ICD-10-CM | POA: Diagnosis not present

## 2016-04-25 DIAGNOSIS — R488 Other symbolic dysfunctions: Secondary | ICD-10-CM | POA: Diagnosis not present

## 2016-04-25 DIAGNOSIS — R531 Weakness: Secondary | ICD-10-CM | POA: Diagnosis not present

## 2016-04-25 DIAGNOSIS — N179 Acute kidney failure, unspecified: Secondary | ICD-10-CM | POA: Diagnosis not present

## 2016-04-25 DIAGNOSIS — G9340 Encephalopathy, unspecified: Secondary | ICD-10-CM | POA: Diagnosis not present

## 2016-04-25 DIAGNOSIS — G8929 Other chronic pain: Secondary | ICD-10-CM | POA: Diagnosis not present

## 2016-04-25 DIAGNOSIS — J45909 Unspecified asthma, uncomplicated: Secondary | ICD-10-CM | POA: Diagnosis not present

## 2016-04-25 DIAGNOSIS — R5381 Other malaise: Secondary | ICD-10-CM | POA: Diagnosis not present

## 2016-04-25 DIAGNOSIS — G4733 Obstructive sleep apnea (adult) (pediatric): Secondary | ICD-10-CM | POA: Diagnosis not present

## 2016-04-25 DIAGNOSIS — E871 Hypo-osmolality and hyponatremia: Secondary | ICD-10-CM | POA: Diagnosis not present

## 2016-04-25 DIAGNOSIS — R2681 Unsteadiness on feet: Secondary | ICD-10-CM | POA: Diagnosis not present

## 2016-04-25 DIAGNOSIS — E1140 Type 2 diabetes mellitus with diabetic neuropathy, unspecified: Secondary | ICD-10-CM | POA: Diagnosis not present

## 2016-04-25 DIAGNOSIS — M6281 Muscle weakness (generalized): Secondary | ICD-10-CM | POA: Diagnosis not present

## 2016-04-25 DIAGNOSIS — E785 Hyperlipidemia, unspecified: Secondary | ICD-10-CM | POA: Diagnosis not present

## 2016-04-25 DIAGNOSIS — G894 Chronic pain syndrome: Secondary | ICD-10-CM | POA: Diagnosis not present

## 2016-04-25 LAB — BASIC METABOLIC PANEL
ANION GAP: 12 (ref 5–15)
BUN: 17 mg/dL (ref 6–20)
CALCIUM: 8.8 mg/dL — AB (ref 8.9–10.3)
CO2: 20 mmol/L — AB (ref 22–32)
Chloride: 102 mmol/L (ref 101–111)
Creatinine, Ser: 1.22 mg/dL — ABNORMAL HIGH (ref 0.44–1.00)
GFR calc non Af Amer: 48 mL/min — ABNORMAL LOW (ref >60)
GFR, EST AFRICAN AMERICAN: 55 mL/min — AB (ref >60)
GLUCOSE: 189 mg/dL — AB (ref 65–99)
POTASSIUM: 3.7 mmol/L (ref 3.5–5.1)
Sodium: 134 mmol/L — ABNORMAL LOW (ref 135–145)

## 2016-04-25 LAB — GLUCOSE, CAPILLARY
GLUCOSE-CAPILLARY: 140 mg/dL — AB (ref 65–99)
GLUCOSE-CAPILLARY: 168 mg/dL — AB (ref 65–99)
GLUCOSE-CAPILLARY: 143 mg/dL — AB (ref 65–99)
Glucose-Capillary: 121 mg/dL — ABNORMAL HIGH (ref 65–99)

## 2016-04-25 IMAGING — MR MR HEAD W/O CM
9 of 11 series · 34 of 48 positions shown · non-contrast
Comparison: CT head without contrast 10/28/2014. MRI brain
06/05/2011.

CLINICAL DATA: TIA. Episodes of dizziness lightheadedness or last 2
weeks. Right-sided headaches 2 weeks. Syncopal episode.

EXAM:
MRI HEAD WITHOUT CONTRAST
TECHNIQUE: Multiplanar, multiecho pulse sequences of the brain and surrounding
structures were obtained without intravenous contrast.

[Series 3: DWI · axial · 3.6mm · 0.94mm/px · z∈[-112,+39]mm · 9 of 86 slices shown (1 of 4)]
[im 1/86]
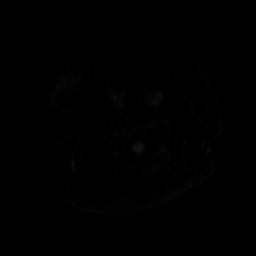
[im 11/86]
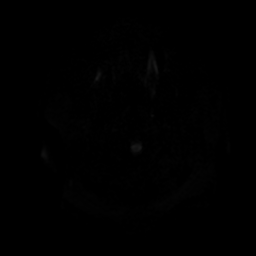
[im 22/86]
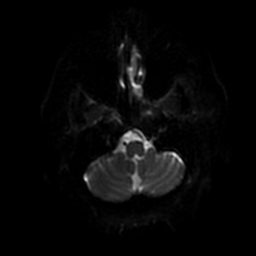
[im 32/86]
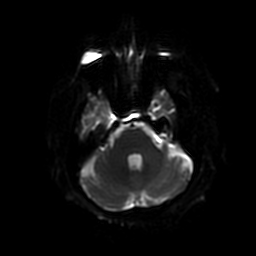
[im 43/86]
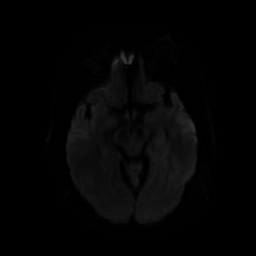
[im 54/86]
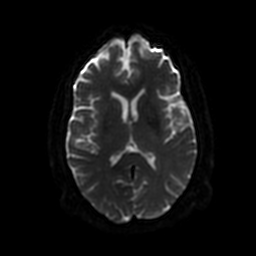
[im 64/86]
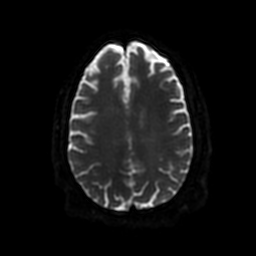
[im 75/86]
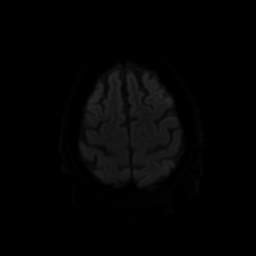
[im 86/86]
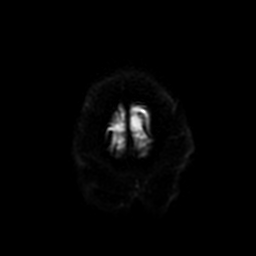

[Series 4: T2 · axial · 5.0mm · 0.47mm/px · z∈[-111,+38]mm · 2 of 26 slices shown (1 of 2)]
[im 1/26]
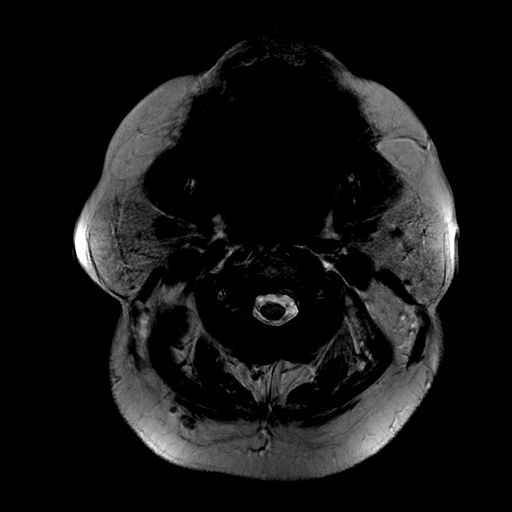
[im 26/26]
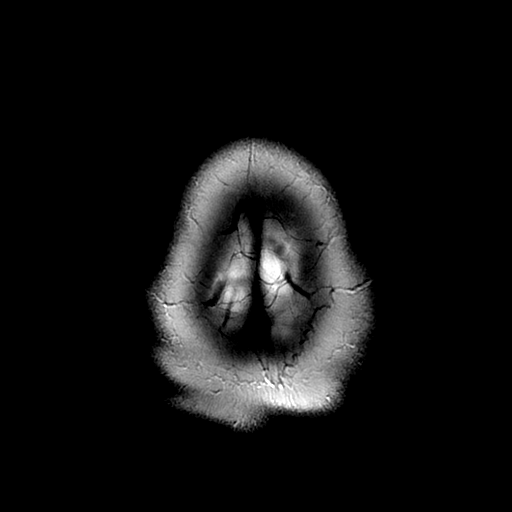

[Series 5: FLAIR · axial · 5.0mm · 0.47mm/px · z∈[-111,+38]mm · 2 of 26 slices shown (1 of 2)]
[im 1/26]
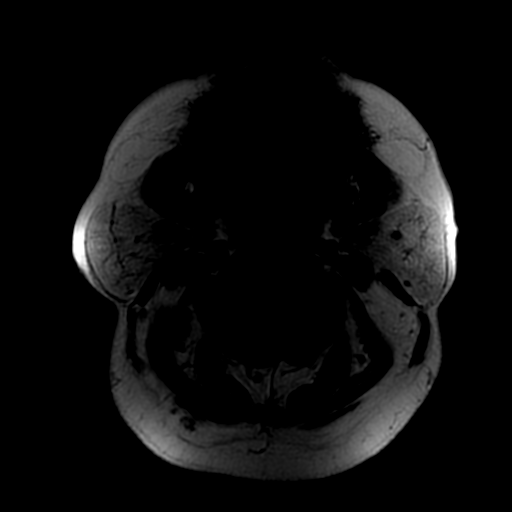
[im 26/26]
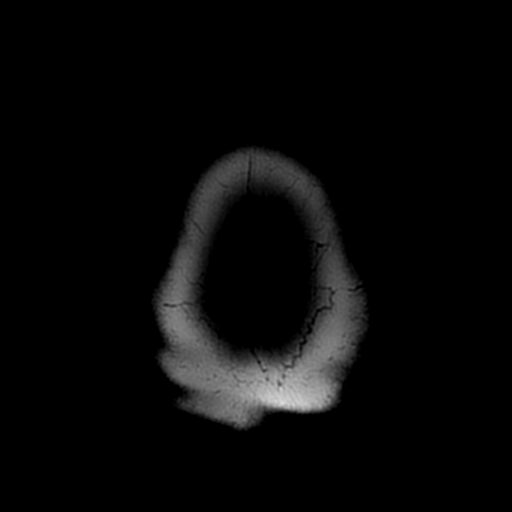

[Series 6: (person_name) · axial · 3.0mm · 0.47mm/px · z∈[-113,-62]mm · 3 of 104 slices shown]
[im 1/104]
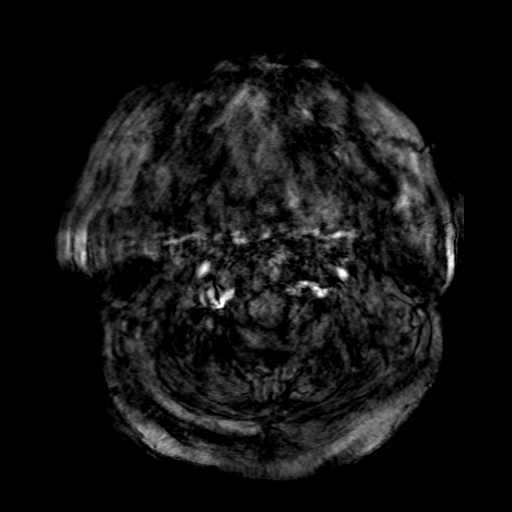
[im 12/104]
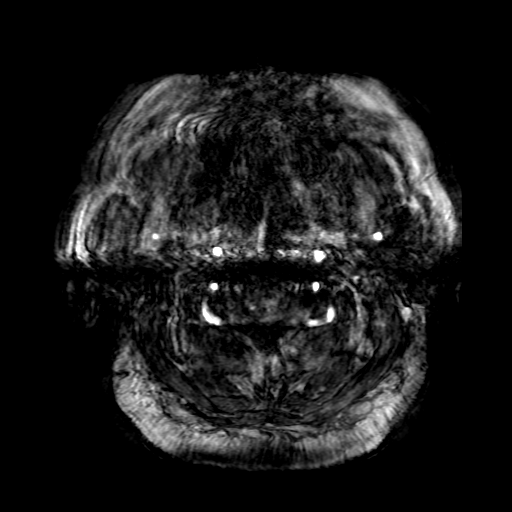
[im 35/104]
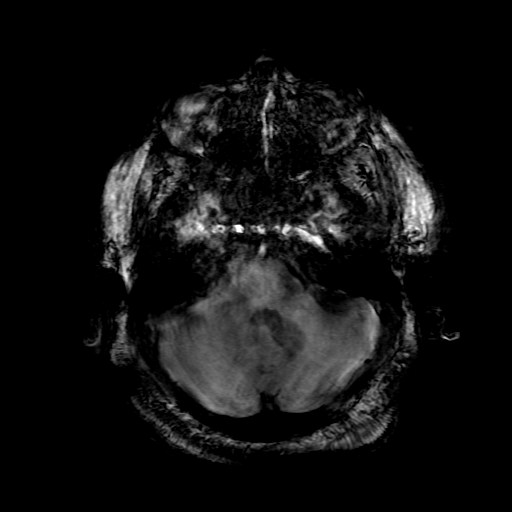

[Series 8: FLAIR · sagittal · 5.0mm · 0.47mm/px · 2 of 23 slices shown (2 of 2)]
[im 1/23]
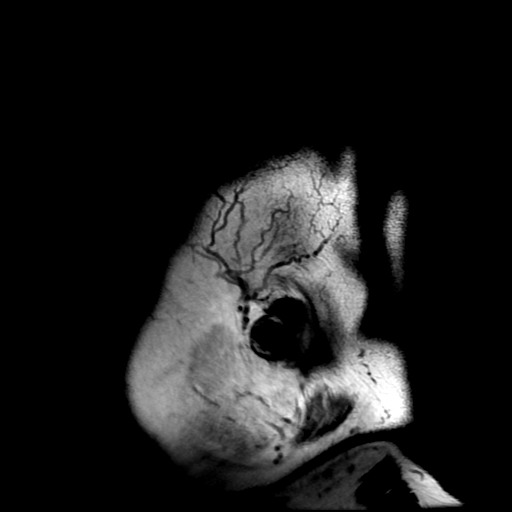
[im 23/23]
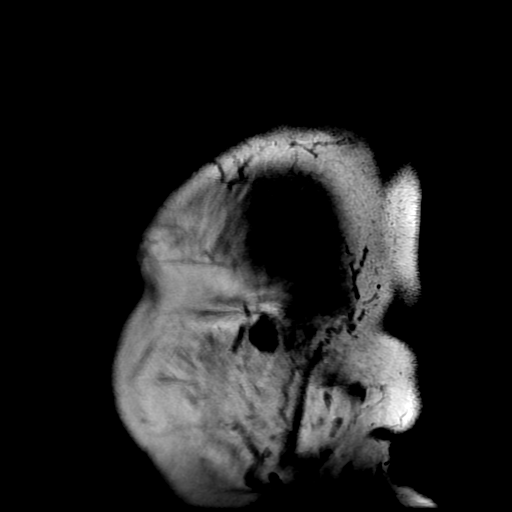

[Series 9: DWI · coronal · 5.0mm · 0.94mm/px · 6 of 66 slices shown (2 of 4)]
[im 1/66]
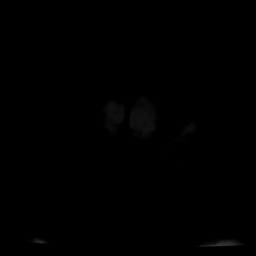
[im 14/66]
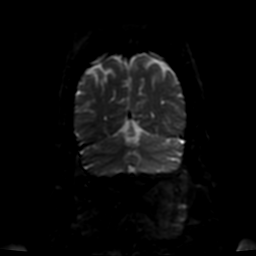
[im 27/66]
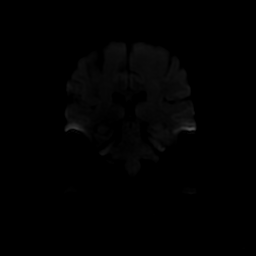
[im 40/66]
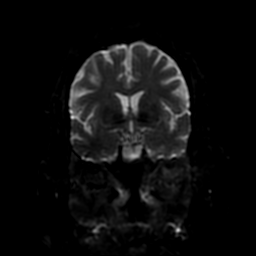
[im 53/66]
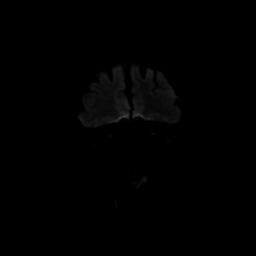
[im 66/66]
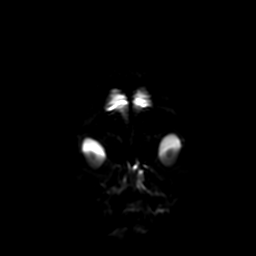

[Series 10: T2 · coronal · 5.0mm · 0.47mm/px · 3 of 28 slices shown (2 of 2)]
[im 1/28]
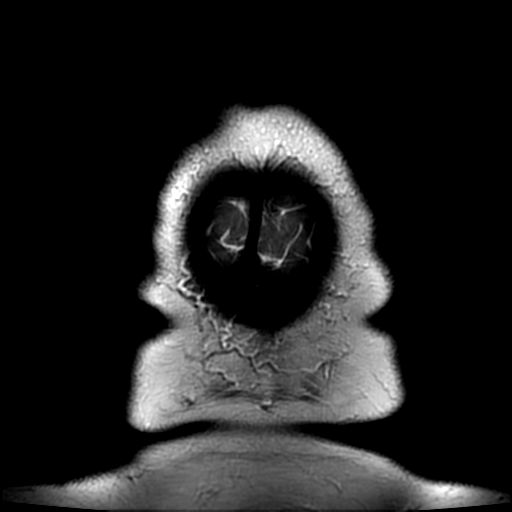
[im 14/28]
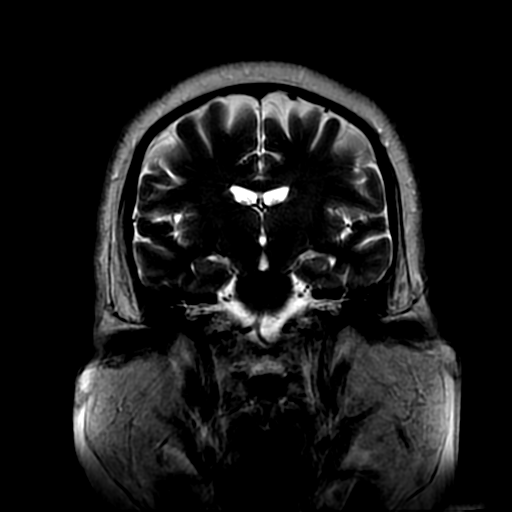
[im 28/28]
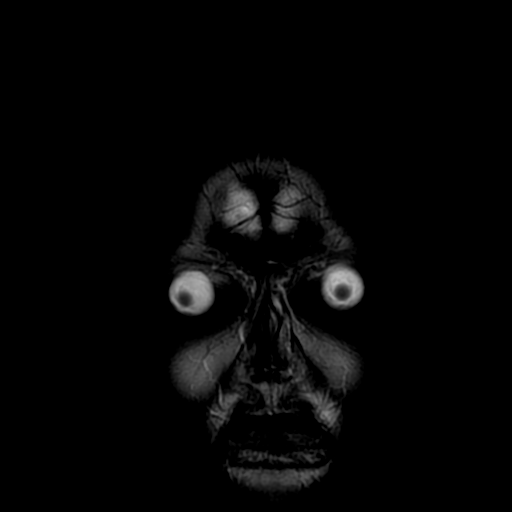

[Series 300: DWI · axial · 3.6mm · 0.94mm/px · z∈[-112,+39]mm · 4 of 42 slices shown (3 of 4)]
[im 1/42]
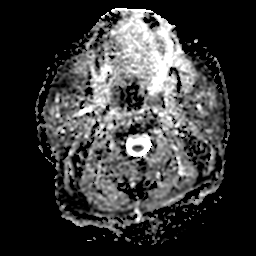
[im 14/42]
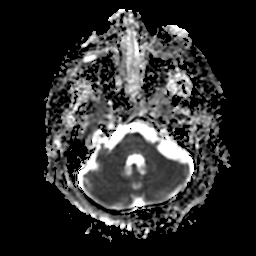
[im 28/42]
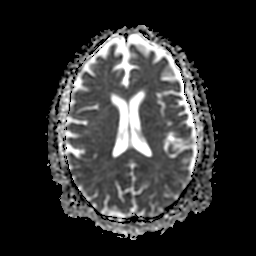
[im 42/42]
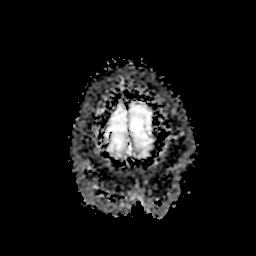

[Series 900: DWI · coronal · 5.0mm · 0.94mm/px · 3 of 32 slices shown (4 of 4)]
[im 1/32]
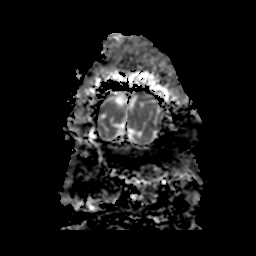
[im 16/32]
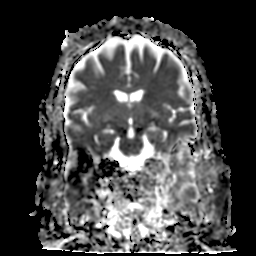
[im 32/32]
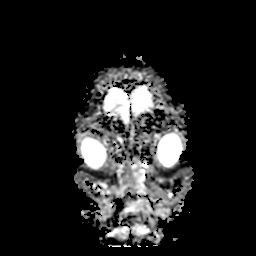

[34 of 48 positions shown; findings below may reference images not displayed]

FINDINGS: A relatively empty sella is again noted. No acute infarct,
hemorrhage, or mass lesion is present. The ventricles are of normal
size. Insert pass fluid

The basal ganglia and brainstem are within normal limits. Flow is
present in the major intracranial arteries. Exophthalmos is again
seen bilaterally. There is straightening of the optic nerves. No
mass lesion is present. Minimal mucosal thickening is present in the
left maxillary sinus, left sphenoid sinus, and anterior left ethmoid
air cells. There is some fluid in left mastoid air cells as well. No
obstructing nasopharyngeal lesion is present.

Midline structures are unremarkable.
IMPRESSION: 1. No acute or focal lesion to explain the patient's symptoms.
2. Normal MRI appearance brain.
3. Bilateral exophthalmos is again noted. No focal retrobulbar mass
lesion is present. This could be related to thyroid disease.
Laboratory results suggests the patient is euthyroid.

## 2016-04-25 MED ORDER — ZIPRASIDONE HCL 20 MG PO CAPS
20.0000 mg | ORAL_CAPSULE | Freq: Two times a day (BID) | ORAL | Status: DC
  Filled 2016-04-25: qty 1

## 2016-04-25 MED ORDER — INSULIN ASPART 100 UNIT/ML ~~LOC~~ SOLN: 0.0000 [IU] | Freq: Three times a day (TID) | SUBCUTANEOUS | 11 refills | Status: DC

## 2016-04-25 MED ORDER — LINACLOTIDE 290 MCG PO CAPS: 290.0000 ug | ORAL_CAPSULE | Freq: Every day | ORAL | Status: DC

## 2016-04-25 MED ORDER — LORAZEPAM 0.5 MG PO TABS: 0.5000 mg | ORAL_TABLET | Freq: Every evening | ORAL | 0 refills | Status: DC | PRN

## 2016-04-25 MED ORDER — ROSUVASTATIN CALCIUM 10 MG PO TABS: 20.0000 mg | ORAL_TABLET | Freq: Every day | ORAL | Status: DC

## 2016-04-25 MED ORDER — ZIPRASIDONE HCL 20 MG PO CAPS: 20.0000 mg | ORAL_CAPSULE | Freq: Two times a day (BID) | ORAL | Status: DC

## 2016-04-25 MED ORDER — PREGABALIN 100 MG PO CAPS
200.0000 mg | ORAL_CAPSULE | Freq: Two times a day (BID) | ORAL | Status: DC
  Administered 2016-04-25: 200 mg via ORAL
  Filled 2016-04-25: qty 2

## 2016-04-25 MED ORDER — ACETAMINOPHEN 500 MG PO TABS
1000.0000 mg | ORAL_TABLET | Freq: Three times a day (TID) | ORAL | Status: DC
Start: 2016-04-25 — End: 2016-04-25
  Administered 2016-04-25: 1000 mg via ORAL
  Filled 2016-04-25: qty 2

## 2016-04-25 MED ORDER — VENLAFAXINE HCL ER 75 MG PO CP24
150.0000 mg | ORAL_CAPSULE | Freq: Two times a day (BID) | ORAL | Status: DC
  Administered 2016-04-25: 150 mg via ORAL
  Filled 2016-04-25: qty 2

## 2016-04-25 MED ORDER — PREGABALIN 200 MG PO CAPS: 200.0000 mg | ORAL_CAPSULE | Freq: Every day | ORAL | Status: DC

## 2016-04-25 MED ORDER — ZIPRASIDONE HCL 40 MG PO CAPS
40.0000 mg | ORAL_CAPSULE | Freq: Every day | ORAL | Status: DC
  Filled 2016-04-25: qty 1

## 2016-04-25 NOTE — Clinical Social Work Note (Signed)
Clinical Social Work Assessment  Patient Details  Name: [name] MRN: [id number] Date of Birth: Mar 06, 1955  Date of referral:  04/25/16               Reason for consult:  Discharge Planning                Permission sought to share information with:  Family Supports Permission granted to share information::  Yes, Verbal Permission Granted  Name::     [name]  Agency::     Relationship::  son  Contact Information:  [phone]  Housing/Transportation Living arrangements for the past 2 months:  [place] of Information:  Patient Patient Interpreter Needed:  None Criminal Activity/Legal Involvement Pertinent to Current Situation/Hospitalization:  No - Comment as needed Significant Relationships:  Adult Children Lives with:  Self Do you feel safe going back to the place where you live?  No Need for family participation in patient care:  Yes (Comment)  Care giving concerns: Patient has support from adult son and daughter  Social Worker assessment / plan:  CSW met patient at bedside to offer support and discuss discharge need. Patients son [name] was on speaker phone during assessment and wanted patient to go to SNF. Patient stated "she does not want to go to SNF unless its [facility]". Patient stated she has a caregiver coming into the home 7/days week for 3hrs each day. Patient stated aside from her caregiver she lives at home alone. CSW to complete necessary paperwork and initiate SNF on patients behalf. Patients son is interested in long term care and would like more information on it. CSW to follow up with patient once bed offer from [facility] is available.  Employment status:    Insurance informationEducational psychologist PT Recommendations:  Inpatient Rehab Consult Information / Referral to community resources:  [place]  Patient/Family's Response to care:  Patient verbalized appreciation and understanding for CSW role and involvement in  [place]. Patient agreeable with current discharge plan to SNF as long as she get into [facility]. Patient stated if [name] has no beds she will go home.  Patient/Family's Understanding of and Emotional Response to Diagnosis, Current Treatment, and Prognosis:  Patient with good understanding of current medical state and limitations around most recent hospitalization.   Emotional Assessment Appearance:  Appears stated age Attitude/Demeanor/Rapport:  Other Affect (typically observed):  Calm Orientation:  Oriented to Self, Oriented to Place Alcohol / Substance use:  Not Applicable Psych involvement (Current and /or in the community):  No (Comment)  Discharge Needs  Concerns to be addressed:    Readmission within the last 30 days:  Yes Current discharge risk:  None Barriers to Discharge:  No Barriers Identified   Tammie Neighbors, LCSW 04/25/2016, 11:09 AM

## 2016-04-25 NOTE — Progress Notes (Signed)
Completely awake and alert this morning Acknowledges that she was not taking narcotics while at SNF When she got back home this past weekend, she started taking narcotics, benzodiazepines etc. We had a long discussion regarding importance of avoiding polypharmacy-she has not been on any narcotics since admission-and currently has no signs of withdrawal at all. We'll continue to avoid narcotics as much as we can, her electrolytes have significantly improved even if we can find her a SNF bed today, she is stable for discharge. I have also discussed the importance of avoiding polypharmacy with her son over the phone.

## 2016-04-25 NOTE — Progress Notes (Signed)
04/25/2016  1630 Pt discharged to [facility] via [facility].

## 2016-04-25 NOTE — Care Management Note (Signed)
Case Management Note  Patient Details  Name: [name] MRN: [id number] Date of Birth: 05/30/1955  Subjective/Objective:                    Action/Plan:  Anticipate DC to SNF today as ordered by MD. Facilitated by CSW.   Expected Discharge Date:  04/25/16               Expected Discharge Plan:  Skilled Nursing Facility  In-House Referral:  Clinical Social Work  Discharge planning Services  CM Consult  Post Acute Care Choice:    Choice offered to:     DME Arranged:    DME Agency:     HH Arranged:    [place] Agency:     Status of Service:  Completed, signed off  If discussed at [employer], dates discussed:    Additional Comments:  Carles Collet, RN 04/25/2016, 10:22 AM

## 2016-04-25 NOTE — Clinical Social Work Placement (Signed)
CLINICAL SOCIAL WORK PLACEMENT  NOTE  Date:  04/25/2016  Patient Details  Name: [name] MRN: [id number] Date of Birth: 1955/05/22  Clinical Social Work is seeking post-discharge placement for this patient at the [place] level of care (*CSW will initial, date and re-position this form in  chart as items are completed):  Yes   Patient/family provided with [facility] Work Department's list of facilities offering this level of care within the geographic area requested by the patient (or if unable, by the patient's family).  Yes   Patient/family informed of their freedom to choose among providers that offer the needed level of care, that participate in Medicare, Medicaid or managed care program needed by the patient, have an available bed and are willing to accept the patient.  Yes   Patient/family informed of [facility]'s ownership interest in [facility] and [facility], as well as of the fact that they are under no obligation to receive care at these facilities.  PASRR submitted to EDS on       PASRR number received on       Existing PASRR number confirmed on 04/25/16     FL2 transmitted to all facilities in geographic area requested by pt/family on       FL2 transmitted to all facilities within larger geographic area on       Patient informed that his/her managed care company has contracts with or will negotiate with certain facilities, including the following:            Patient/family informed of bed offers received.  Patient chooses bed at       Physician recommends and patient chooses bed at      Patient to be transferred to   on  .  Patient to be transferred to facility by       Patient family notified on   of transfer.  Name of family member notified:        PHYSICIAN Please sign FL2     Additional Comment:    _______________________________________________ Wende Neighbors, LCSW 04/25/2016, 11:17 AM

## 2016-04-25 NOTE — Progress Notes (Signed)
04/25/2016 5:16 PM Report called to [facility]. [place]

## 2016-04-25 NOTE — Progress Notes (Signed)
Clinical Social Worker facilitated patient discharge including contacting patient family and facility to confirm patient discharge plans.  Clinical information faxed to facility and family agreeable with plan.  CSW arranged ambulance transport via [facility] to [facility] .  RN Juliann Pulse to call [phone] for report prior to discharge.  Clinical Social Worker will sign off for now as social work intervention is no longer needed. Please consult [place] again if new need arises.  Rhea Pink, MSW, [place]

## 2016-04-25 NOTE — Discharge Summary (Signed)
PATIENT DETAILS Name: [name] Age: 61 y.o. Sex: female Date of Birth: [birth year] MRN: [id number]. Admitting Physician: Jonetta Osgood, MD OIZ:[id number] [name], MD  Admit Date: 04/23/2016 Discharge date: 04/25/2016  Recommendations for Outpatient Follow-up:  1. Follow up with PCP in 1-2 weeks 2. Please obtain BMP/CBC in one week 3. Minimize polypharmacy as much as possible  Admitted From:  Home  Disposition: SNF   Home Health: No  Equipment/Devices: None  Discharge Condition: Stable  CODE STATUS: FULL CODE  Diet recommendation:  Heart Healthy   Brief Summary: See H&P, Labs, Consult and Test reports for all details in brief,Patient is a 61 y.o. female with past medical history of type 2 diabetes, neuropathy, chronic back pain on narcotics, recent admission for presumed polypharmacy-discharged to a skilled nursing facility-and just got home this past weekend and apparently restarted taking her narcotics and benzodiazepines, she was found to be confused and lethargic by a home health aide, she was subsequently readmitted on 3/7 for acute toxic encephalopathy probably due to polypharmacy. See below for further details  Brief Hospital Course: Acute toxic encephalopathy: Resolved, thought to be secondary to polypharmacy. CT head negative on admission. Have counseled patient extensively regarding dangers of polypharmacy, subsequently spoke with her son over the phone. We have discontinued narcotic medications-per patient, she was not taking narcotics while at SNF. There are no symptoms of any narcotic withdrawal at this present time.  Acute kidney injury: Likely hemodynamic mediated acute kidney injury-significantly improved. Please check electrolytes in 1 week.  Hyponatremia: likely due to dehydration- resolved with IV fluids.  Hypokalemia: Repleted  Hypertension: Blood pressure currently controlled, given acute renal failure-multiple antihypertensives were  held on admission. Since blood pressure now stable and slowly creeping up, we will resume Coreg on discharge. Continue to follow closely at SNF, and restart other agents when able.  Hypothyroidism: Continue Synthroid  GERD: Continue PPI  Type 2 diabetes: CBGs relatively controlled with SSI-since her diet has now started to improve-her CBGs have started to creep up-resume basal insulin on discharge. Follow closely while at SNF, and adjust her insulin regimen.   Chronic back pain/peripheral neuropathy/chronic pain syndrome: Continue to hold all narcotics-currently no signs of any withdrawal symptoms (in spite being in the hospital for more than 2 days)-given issues with a polypharmacy-poor long-term candidate for a narcotics. Patient does acknowledge that while she was at SNF recently, she was not on any narcotics, she went home and started on her usual dosing of narcotics. Lyrica has been resumed, we will start scheduled Tylenol to see if that would help with the pain. However she seems very comfortable and denies active pain issues-pain currently seems to be well controlled with Tylenol and lytic.  History of schizophrenia/bipolar disorder: Psychiatry consulted during this hospital stay given issues with polypharmacy, recommendations were to resume she Geodon and Venlafaxine when more awake and alert.   OSA: Continue CPAP daily at bedtime  Procedures/Studies: None  Discharge Diagnoses:  Principal Problem:   Encephalopathy acute Active Problems:   Asthma   Type II diabetes mellitus (HCC)   OSA on CPAP   Chronic lower back pain   Diabetic polyneuropathy (HCC)   Obesity, morbid, BMI 50 or higher (HCC)   Encephalopathy   Acute renal failure (ARF) (HCC)   Chronic pain syndrome   Schizophrenia (HCC)   Hyponatremia   Discharge Instructions:  Activity:  As tolerated with Full fall precautions use walker/cane & assistance as needed   Discharge Instructions    (  HEART FAILURE  PATIENTS) Call MD:  Anytime you have any of the following symptoms: 1) 3 pound weight gain in 24 hours or 5 pounds in 1 week 2) shortness of breath, with or without a dry hacking cough 3) swelling in the hands, feet or stomach 4) if you have to sleep on extra pillows at night in order to breathe.    Complete by:  As directed    Call MD for:  persistant nausea and vomiting    Complete by:  As directed    Call MD for:  severe uncontrolled pain    Complete by:  As directed    Diet - low sodium heart healthy    Complete by:  As directed    Discharge instructions    Complete by:  As directed    CPAP daily at bedtime  CBGs before meals and at bedtime   Increase activity slowly    Complete by:  As directed      Allergies as of 04/25/2016      Reactions   Bee Venom Anaphylaxis   Shellfish Allergy Anaphylaxis   Buprenorphine Hcl Other (See Comments)   Overly sedated      Medication List    STOP taking these medications   acetaminophen 500 MG tablet Commonly known as:  TYLENOL   doxepin 25 MG capsule Commonly known as:  SINEQUAN   ferrous sulfate 325 (65 FE) MG EC tablet   gabapentin 100 MG capsule Commonly known as:  NEURONTIN   guaiFENesin 600 MG 12 hr tablet Commonly known as:  MUCINEX   ibuprofen 200 MG tablet Commonly known as:  ADVIL,MOTRIN   ICY HOT 16 % Liqd Generic drug:  Menthol (Topical Analgesic)   NUCYNTA ER 250 MG Tb12 Generic drug:  Tapentadol HCl   oxyCODONE 15 MG immediate release tablet Commonly known as:  ROXICODONE   oxyCODONE 5 MG immediate release tablet Commonly known as:  Oxy IR/ROXICODONE   tiZANidine 2 MG tablet Commonly known as:  ZANAFLEX   valACYclovir 1000 MG tablet Commonly known as:  VALTREX   valsartan 40 MG tablet Commonly known as:  DIOVAN   valsartan-hydrochlorothiazide 160-12.5 MG tablet Commonly known as:  DIOVAN-HCT     TAKE these medications   PROAIR HFA 108 (90 Base) MCG/ACT inhaler Generic drug:  albuterol Inhale 2  puffs into the lungs every 4 (four) hours as needed for wheezing or shortness of breath. What changed:  Another medication with the same name was removed. Continue taking this medication, and follow the directions you see here.   albuterol (2.5 MG/3ML) 0.083% nebulizer solution Commonly known as:  PROVENTIL Inhale 3 mLs into the lungs every 6 (six) hours as needed. For shortness of breath What changed:  Another medication with the same name was removed. Continue taking this medication, and follow the directions you see here.   aspirin EC 81 MG tablet Take 1 tablet (81 mg total) by mouth daily.   B-complex with vitamin C tablet Take 1 tablet by mouth daily.   carvedilol 3.125 MG tablet Commonly known as:  COREG Take 1 tablet (3.125 mg total) by mouth 2 (two) times daily with a meal.   docusate sodium 50 MG capsule Commonly known as:  COLACE Take 100 mg by mouth at bedtime.   esomeprazole 40 MG capsule Commonly known as:  NEXIUM Take 1 capsule (40 mg total) by mouth daily before breakfast. What changed:  when to take this   furosemide 40 MG tablet Commonly  known as:  LASIX Take 1 tablet (40 mg total) by mouth daily.   insulin aspart 100 UNIT/ML injection Commonly known as:  novoLOG Inject 0-9 Units into the skin 3 (three) times daily with meals. 0-9 Units, Subcutaneous CBG < 70: implement hypoglycemia protocol CBG 70 - 120: 0 units CBG 121 - 150: 1 unit CBG 151 - 200: 2 units CBG 201 - 250: 3 units CBG 251 - 300: 5 units CBG 301 - 350: 7 units CBG 351 - 400: 9 units CBG > 400: call MD   LEVEMIR FLEXTOUCH 100 UNIT/ML Pen Generic drug:  Insulin Detemir Inject 10 Units into the skin at bedtime.   levothyroxine 150 MCG tablet Commonly known as:  SYNTHROID, LEVOTHROID Take 150 mcg by mouth daily before breakfast.   LINZESS 290 MCG Caps capsule Generic drug:  linaclotide Take 290 mcg by mouth daily before breakfast.   LORazepam 0.5 MG tablet Commonly known as:  ATIVAN Take 1  tablet (0.5 mg total) by mouth at bedtime as needed for anxiety. Take one-half tablet in the morning and one tablet by mouth nightly as needed for insomnia What changed:  medication strength  how much to take  when to take this  reasons to take this   multivitamin with minerals Tabs tablet Take 1 tablet by mouth daily.   ONE-A-DAY WOMENS 50+ ADVANTAGE Tabs Take 1 tablet by mouth daily.   pregabalin 200 MG capsule Commonly known as:  LYRICA Take 1 capsule (200 mg total) by mouth daily. What changed:  medication strength  how much to take  when to take this   rosuvastatin 20 MG tablet Commonly known as:  CRESTOR Take 20 mg by mouth daily.   venlafaxine XR 150 MG 24 hr capsule Commonly known as:  EFFEXOR-XR Take 150 mg by mouth 2 (two) times daily.   Vitamin D-3 1000 units Caps Take 1,000 Units by mouth daily.   ziprasidone 20 MG capsule Commonly known as:  GEODON Take 1 capsule (20 mg total) by mouth 2 (two) times daily with a meal. What changed:  medication strength  how much to take  when to take this      Follow-up Information    [name], MD. Schedule an appointment as soon as possible for a visit in 2 week(s).   Specialty:  Internal Medicine Contact information: [address] [place] [address] [phone]          Allergies  Allergen Reactions  . Bee Venom Anaphylaxis  . Shellfish Allergy Anaphylaxis  . Buprenorphine Hcl Other (See Comments)    Overly sedated    Consultations:  Psychiatry   Other Procedures/Studies: Ct Abdomen Pelvis Wo Contrast  Result Date: 03/28/2016 CLINICAL DATA:  Right flank pain EXAM: CT ABDOMEN AND PELVIS WITHOUT CONTRAST TECHNIQUE: Multidetector CT imaging of the abdomen and pelvis was performed following the standard protocol without IV contrast. COMPARISON:  06/23/2015 FINDINGS: Lower chest: Mild ground-glass densities in the left greater than right posterior lower lobes. No  effusion. The heart appears borderline enlarged. Hepatobiliary: No focal liver abnormality is seen. Status post cholecystectomy. No biliary dilatation. Pancreas: Unremarkable. No pancreatic ductal dilatation or surrounding inflammatory changes. Spleen: Normal in size without focal abnormality. Adrenals/Urinary Tract: Adrenal glands are unremarkable. Kidneys are normal, without renal calculi, focal lesion, or hydronephrosis. Bladder is unremarkable. Nonspecific perinephric fat stranding. Stomach/Bowel: Stomach is within normal limits. Appendix contains a few stones but is otherwise normal. No evidence of bowel wall thickening, distention, or  inflammatory changes. Large amount of stool in the right and transverse colon. Vascular/Lymphatic: Aortic atherosclerosis. No enlarged abdominal or pelvic lymph nodes. Reproductive: Status post hysterectomy. No adnexal masses. Other: Small fat containing umbilical hernia. No free air or free fluid. Musculoskeletal: Degenerative changes. No acute or suspicious bone lesions. IMPRESSION: 1. Negative for nephrolithiasis hydronephrosis or ureteral stone. 2. Mild density in the left greater than right posterior lower lobes could reflect pneumonia or possible aspiration. Electronically Signed   By: Donavan Foil M.D.   On: 03/28/2016 01:13   Dg Chest 2 View  Result Date: 03/29/2016 CLINICAL DATA:  Difficulty breathing, hypoxia EXAM: CHEST  2 VIEW COMPARISON:  03/27/2016 FINDINGS: Increased interstitial markings. Mild patchy lingular/left lower lobe opacity, pneumonia not excluded. No pleural effusion or pneumothorax. The heart is top-normal in size. Surgical clips overlying the right neck. IMPRESSION: Mild patchy lingular/left lower lobe opacity, pneumonia not excluded. Increased interstitial markings without frank interstitial edema. Electronically Signed   By: Julian Hy M.D.   On: 03/29/2016 17:54   Dg Chest 2 View  Result Date: 03/27/2016 CLINICAL DATA:  Sepsis EXAM:  CHEST  2 VIEW COMPARISON:  02/29/2016 FINDINGS: Low lung volumes. Borderline cardiomegaly. No aortic aneurysm. Mild bilateral interstitial edema. Possible superimposed pneumonia at each lung base left greater than right given slightly more confluent airspace opacities noted. Surgical clips are seen at the base the neck as before. No sizable effusion. No pneumothorax identified. No acute osseous abnormality. Small calcification about the right shoulder may reflect calcific tendinopathy. IMPRESSION: Low lung volumes with borderline cardiomegaly and interstitial edema. Subtle airspace opacities at the lung bases left greater than right cannot exclude superimposed pneumonia. Electronically Signed   By: Ashley Royalty M.D.   On: 03/27/2016 18:11   Dg Abd 1 View  Result Date: 04/02/2016 CLINICAL DATA:  Nausea and vomiting starting yesterday EXAM: ABDOMEN - 1 VIEW COMPARISON:  04/01/2016 FINDINGS: Mild gaseous distended small bowel loops are noted in left mid abdomen suspicious for ileus, enteritis or early bowel obstruction. Some colonic gas noted in right colon and transverse colon. Colonic gas noted within rectum. IMPRESSION: Mild gaseous distended small bowel loops in left mid abdomen suspicious for mild ileus enteritis or less likely small bowel obstruction. Electronically Signed   By: Lahoma Crocker M.D.   On: 04/02/2016 10:36   Ct Head Wo Contrast  Result Date: 04/01/2016 CLINICAL DATA:  Acute onset of altered mental status. Initial encounter. EXAM: CT HEAD WITHOUT CONTRAST TECHNIQUE: Contiguous axial images were obtained from the base of the skull through the vertex without intravenous contrast. COMPARISON:  CT of the head performed 03/27/2016, and MRI of the brain performed 02/29/2016 FINDINGS: Brain: No evidence of acute infarction, hemorrhage, hydrocephalus, extra-axial collection or mass lesion/mass effect. Prominence of the ventricles and sulci reflects mild cortical volume loss. Mild cerebellar atrophy is  noted. The brainstem and fourth ventricle are within normal limits. The basal ganglia are unremarkable in appearance. The cerebral hemispheres demonstrate grossly normal gray-white differentiation. No mass effect or midline shift is seen. Vascular: No hyperdense vessel or unexpected calcification. Skull: There is no evidence of fracture; visualized osseous structures are unremarkable in appearance. A mildly prominent empty sella is noted. Sinuses/Orbits: The orbits are within normal limits. The paranasal sinuses and mastoid air cells are well-aerated. Other: No significant soft tissue abnormalities are seen. Prominent cerumen is seen at the external auditory canals bilaterally, larger on the right. IMPRESSION: 1. No acute intracranial pathology seen on CT. 2. Mild cortical volume  loss noted. 3. Prominent cerumen at the external auditory canals bilaterally. Electronically Signed   By: Garald Balding M.D.   On: 04/01/2016 21:53   Ct Head Wo Contrast  Result Date: 03/27/2016 CLINICAL DATA:  Multiple falls. EXAM: CT HEAD WITHOUT CONTRAST TECHNIQUE: Contiguous axial images were obtained from the base of the skull through the vertex without intravenous contrast. COMPARISON:  02/29/2016 FINDINGS: Brain: There is no evidence for acute hemorrhage, hydrocephalus, mass lesion, or abnormal extra-axial fluid collection. No definite CT evidence for acute infarction. Vascular: No hyperdense vessel or unexpected calcification. Skull: Normal. Negative for fracture or focal lesion. Sinuses/Orbits: Visualized portions of the paranasal sinuses are clear. Right mastoid air cells are clear. Fluid is visible in the left mastoid air cells. Visualized portions of the globes and intraorbital fat are unremarkable. Other: None. IMPRESSION: 1. No acute intracranial abnormality. 2. Left mastoid effusion. Electronically Signed   By: Misty Stanley M.D.   On: 03/27/2016 17:50   Ct Abdomen Pelvis W Contrast  Result Date: 04/02/2016 CLINICAL  DATA:  Pneumonia, altered mental status.  Abdominal pain. EXAM: CT ABDOMEN AND PELVIS WITH CONTRAST TECHNIQUE: Multidetector CT imaging of the abdomen and pelvis was performed using the standard protocol following bolus administration of intravenous contrast. CONTRAST:  100 cc Isovue 300 IV COMPARISON:  Same day KUB, CT from 03/28/2016 FINDINGS: Lower chest: Small pleural effusions bilaterally slightly increased since prior exam with adjacent atelectasis. Borderline cardiomegaly. No pericardial effusion. Hepatobiliary: Cholecystectomy. No focal liver lesion or biliary dilatation. Pancreas: Fatty atrophy of the pancreas without focal mass or ductal dilatation. No surrounding peripancreatic inflammation. Spleen: Normal size without focal abnormality. Adrenals/Urinary Tract: Normal bilateral adrenal glands and kidneys without obstructive uropathy. Physiologically distended appearance of the bladder. Stomach/Bowel: Contracted stomach. Normal small bowel rotation. Mild jejunal thickening without significant fluid-filled distention. Enteric contrast passes into large bowel. No evidence acute appendicitis. Vascular/Lymphatic: Aortic atherosclerosis. No enlarged abdominal or pelvic lymph nodes. Reproductive: Status post hysterectomy. No adnexal masses. Other: Small fat containing umbilical hernia. No free air nor free fluid. Left lower quadrant iatrogenic subcutaneous emphysema likely related injection subcutaneous medication. Musculoskeletal: Degenerate changes of the dorsal spine. No acute nor suspicious osseous lesions. IMPRESSION: 1. Moderate diffuse thickening of jejunum since prior exam suspicious for enteritis. No mechanical bowel obstruction or pneumatosis. 2. Slight increase in small bilateral pleural effusions. Bibasilar atelectasis without pneumonic consolidations. 3. Small fat containing umbilical hernia. Electronically Signed   By: Ashley Royalty M.D.   On: 04/02/2016 22:51   Nm Pulmonary Perf And Vent  Result  Date: 03/29/2016 CLINICAL DATA:  Acute respiratory failure, hypoxia, cough EXAM: NUCLEAR MEDICINE VENTILATION - PERFUSION LUNG SCAN TECHNIQUE: Ventilation images were obtained in multiple projections using inhaled aerosol Tc-95mDTPA. Perfusion images were obtained in multiple projections after intravenous injection of Tc-939mAA. RADIOPHARMACEUTICALS:  32.5 mCi Technetium-9955mPA aerosol inhalation and 4.32 mCi Technetium-63m43m IV COMPARISON:  None. FINDINGS: Ventilation: Mildly heterogeneous.  No focal ventilation defect. Perfusion: No wedge shaped peripheral perfusion defects to suggest acute pulmonary embolism. IMPRESSION: Negative for pulmonary embolism. Electronically Signed   By: SriyJulian Hy.   On: 03/29/2016 17:50   Dg Abd Portable 1v  Result Date: 04/01/2016 CLINICAL DATA:  Vomiting episodes.  Abdominal pain . EXAM: PORTABLE ABDOMEN - 1 VIEW COMPARISON:  CT 03/28/2016. FINDINGS: Soft tissue structures are unremarkable. Surgical clips right upper quadrant. No bowel distention. No free air. Calcifications in pelvis consistent phleboliths. Degenerative changes lumbar spine and both hips. IMPRESSION: No acute or  focal abnormality. Electronically Signed   By: Marcello Moores  Register   On: 04/01/2016 12:18   Ct Head Code Stroke W/o Cm  Result Date: 04/23/2016 CLINICAL DATA:  Code stroke. Acute onset of slurred speech and weakness. EXAM: CT HEAD WITHOUT CONTRAST TECHNIQUE: Contiguous axial images were obtained from the base of the skull through the vertex without intravenous contrast. COMPARISON:  CT head without contrast 04/01/2016 FINDINGS: Brain: No acute infarct, hemorrhage, or mass lesion is present. The ventricles are of normal size. No significant extraaxial fluid collection is present. No significant white matter disease is present. The brainstem and cerebellum are within normal limits. Vascular: No hyperdense vessel or unexpected calcification. Skull: The calvarium is within normal limits. No  focal lytic or blastic lesions are present. Sinuses/Orbits: The paranasal sinuses are clear. Debris in the external auditory canals is again noted. Go ASPECTS [facility] Stroke Program Early CT Score) - Ganglionic level infarction (caudate, lentiform nuclei, internal capsule, insula, M1-M3 cortex): 7/7 - Supraganglionic infarction (M4-M6 cortex): 3/3 Total score (0-10 with 10 being normal): 10/10 IMPRESSION: 1. Normal CT appearance of the brain. No acute or focal lesion to explain abnormal speech or weakness. 2. ASPECTS is 10/10 Electronically Signed   By: San Morelle M.D.   On: 04/23/2016 14:20     TODAY-DAY OF DISCHARGE:  Subjective:   Hana Trippett today has no headache,no chest abdominal pain,no new weakness tingling or numbness, feels much better  Objective:   Blood pressure (!) 114/59, pulse (!) 105, temperature 99.1 F (37.3 C), temperature source Oral, resp. rate 18, height _0  (1.651 m), weight (!) 139.5 kg (307 lb 8 oz), SpO2 100 %.  Intake/Output Summary (Last 24 hours) at 04/25/16 0959 Last data filed at 04/25/16 0640  Gross per 24 hour  Intake              360 ml  Output             1350 ml  Net             -990 ml   Filed Weights   04/24/16 0350 04/25/16 0245  Weight: (!) 140.4 kg (309 lb 9.6 oz) (!) 139.5 kg (307 lb 8 oz)    Exam: Awake Alert, Oriented *3, No new F.N deficits [place].AT,PERRAL Supple Neck,No JVD, No cervical lymphadenopathy appriciated.  Symmetrical Chest wall movement, Good air movement bilaterally, CTAB RRR,No Gallops,Rubs or new Murmurs, No Parasternal Heave +ve B.Sounds, Abd Soft, Non tender, No organomegaly appriciated, No rebound -guarding or rigidity. No Cyanosis, Clubbing or edema, No new Rash or bruise   PERTINENT RADIOLOGIC STUDIES: Ct Abdomen Pelvis Wo Contrast  Result Date: 03/28/2016 CLINICAL DATA:  Right flank pain EXAM: CT ABDOMEN AND PELVIS WITHOUT CONTRAST TECHNIQUE: Multidetector CT imaging of the abdomen and pelvis was  performed following the standard protocol without IV contrast. COMPARISON:  06/23/2015 FINDINGS: Lower chest: Mild ground-glass densities in the left greater than right posterior lower lobes. No effusion. The heart appears borderline enlarged. Hepatobiliary: No focal liver abnormality is seen. Status post cholecystectomy. No biliary dilatation. Pancreas: Unremarkable. No pancreatic ductal dilatation or surrounding inflammatory changes. Spleen: Normal in size without focal abnormality. Adrenals/Urinary Tract: Adrenal glands are unremarkable. Kidneys are normal, without renal calculi, focal lesion, or hydronephrosis. Bladder is unremarkable. Nonspecific perinephric fat stranding. Stomach/Bowel: Stomach is within normal limits. Appendix contains a few stones but is otherwise normal. No evidence of bowel wall thickening, distention, or inflammatory changes. Large amount of stool in the right and transverse colon.  Vascular/Lymphatic: Aortic atherosclerosis. No enlarged abdominal or pelvic lymph nodes. Reproductive: Status post hysterectomy. No adnexal masses. Other: Small fat containing umbilical hernia. No free air or free fluid. Musculoskeletal: Degenerative changes. No acute or suspicious bone lesions. IMPRESSION: 1. Negative for nephrolithiasis hydronephrosis or ureteral stone. 2. Mild density in the left greater than right posterior lower lobes could reflect pneumonia or possible aspiration. Electronically Signed   By: Donavan Foil M.D.   On: 03/28/2016 01:13   Dg Chest 2 View  Result Date: 03/29/2016 CLINICAL DATA:  Difficulty breathing, hypoxia EXAM: CHEST  2 VIEW COMPARISON:  03/27/2016 FINDINGS: Increased interstitial markings. Mild patchy lingular/left lower lobe opacity, pneumonia not excluded. No pleural effusion or pneumothorax. The heart is top-normal in size. Surgical clips overlying the right neck. IMPRESSION: Mild patchy lingular/left lower lobe opacity, pneumonia not excluded. Increased  interstitial markings without frank interstitial edema. Electronically Signed   By: Julian Hy M.D.   On: 03/29/2016 17:54   Dg Chest 2 View  Result Date: 03/27/2016 CLINICAL DATA:  Sepsis EXAM: CHEST  2 VIEW COMPARISON:  02/29/2016 FINDINGS: Low lung volumes. Borderline cardiomegaly. No aortic aneurysm. Mild bilateral interstitial edema. Possible superimposed pneumonia at each lung base left greater than right given slightly more confluent airspace opacities noted. Surgical clips are seen at the base the neck as before. No sizable effusion. No pneumothorax identified. No acute osseous abnormality. Small calcification about the right shoulder may reflect calcific tendinopathy. IMPRESSION: Low lung volumes with borderline cardiomegaly and interstitial edema. Subtle airspace opacities at the lung bases left greater than right cannot exclude superimposed pneumonia. Electronically Signed   By: Ashley Royalty M.D.   On: 03/27/2016 18:11   Dg Abd 1 View  Result Date: 04/02/2016 CLINICAL DATA:  Nausea and vomiting starting yesterday EXAM: ABDOMEN - 1 VIEW COMPARISON:  04/01/2016 FINDINGS: Mild gaseous distended small bowel loops are noted in left mid abdomen suspicious for ileus, enteritis or early bowel obstruction. Some colonic gas noted in right colon and transverse colon. Colonic gas noted within rectum. IMPRESSION: Mild gaseous distended small bowel loops in left mid abdomen suspicious for mild ileus enteritis or less likely small bowel obstruction. Electronically Signed   By: Lahoma Crocker M.D.   On: 04/02/2016 10:36   Ct Head Wo Contrast  Result Date: 04/01/2016 CLINICAL DATA:  Acute onset of altered mental status. Initial encounter. EXAM: CT HEAD WITHOUT CONTRAST TECHNIQUE: Contiguous axial images were obtained from the base of the skull through the vertex without intravenous contrast. COMPARISON:  CT of the head performed 03/27/2016, and MRI of the brain performed 02/29/2016 FINDINGS: Brain: No  evidence of acute infarction, hemorrhage, hydrocephalus, extra-axial collection or mass lesion/mass effect. Prominence of the ventricles and sulci reflects mild cortical volume loss. Mild cerebellar atrophy is noted. The brainstem and fourth ventricle are within normal limits. The basal ganglia are unremarkable in appearance. The cerebral hemispheres demonstrate grossly normal gray-white differentiation. No mass effect or midline shift is seen. Vascular: No hyperdense vessel or unexpected calcification. Skull: There is no evidence of fracture; visualized osseous structures are unremarkable in appearance. A mildly prominent empty sella is noted. Sinuses/Orbits: The orbits are within normal limits. The paranasal sinuses and mastoid air cells are well-aerated. Other: No significant soft tissue abnormalities are seen. Prominent cerumen is seen at the external auditory canals bilaterally, larger on the right. IMPRESSION: 1. No acute intracranial pathology seen on CT. 2. Mild cortical volume loss noted. 3. Prominent cerumen at the external auditory canals bilaterally. Electronically  Signed   By: Garald Balding M.D.   On: 04/01/2016 21:53   Ct Head Wo Contrast  Result Date: 03/27/2016 CLINICAL DATA:  Multiple falls. EXAM: CT HEAD WITHOUT CONTRAST TECHNIQUE: Contiguous axial images were obtained from the base of the skull through the vertex without intravenous contrast. COMPARISON:  02/29/2016 FINDINGS: Brain: There is no evidence for acute hemorrhage, hydrocephalus, mass lesion, or abnormal extra-axial fluid collection. No definite CT evidence for acute infarction. Vascular: No hyperdense vessel or unexpected calcification. Skull: Normal. Negative for fracture or focal lesion. Sinuses/Orbits: Visualized portions of the paranasal sinuses are clear. Right mastoid air cells are clear. Fluid is visible in the left mastoid air cells. Visualized portions of the globes and intraorbital fat are unremarkable. Other: None.  IMPRESSION: 1. No acute intracranial abnormality. 2. Left mastoid effusion. Electronically Signed   By: Misty Stanley M.D.   On: 03/27/2016 17:50   Ct Abdomen Pelvis W Contrast  Result Date: 04/02/2016 CLINICAL DATA:  Pneumonia, altered mental status.  Abdominal pain. EXAM: CT ABDOMEN AND PELVIS WITH CONTRAST TECHNIQUE: Multidetector CT imaging of the abdomen and pelvis was performed using the standard protocol following bolus administration of intravenous contrast. CONTRAST:  100 cc Isovue 300 IV COMPARISON:  Same day KUB, CT from 03/28/2016 FINDINGS: Lower chest: Small pleural effusions bilaterally slightly increased since prior exam with adjacent atelectasis. Borderline cardiomegaly. No pericardial effusion. Hepatobiliary: Cholecystectomy. No focal liver lesion or biliary dilatation. Pancreas: Fatty atrophy of the pancreas without focal mass or ductal dilatation. No surrounding peripancreatic inflammation. Spleen: Normal size without focal abnormality. Adrenals/Urinary Tract: Normal bilateral adrenal glands and kidneys without obstructive uropathy. Physiologically distended appearance of the bladder. Stomach/Bowel: Contracted stomach. Normal small bowel rotation. Mild jejunal thickening without significant fluid-filled distention. Enteric contrast passes into large bowel. No evidence acute appendicitis. Vascular/Lymphatic: Aortic atherosclerosis. No enlarged abdominal or pelvic lymph nodes. Reproductive: Status post hysterectomy. No adnexal masses. Other: Small fat containing umbilical hernia. No free air nor free fluid. Left lower quadrant iatrogenic subcutaneous emphysema likely related injection subcutaneous medication. Musculoskeletal: Degenerate changes of the dorsal spine. No acute nor suspicious osseous lesions. IMPRESSION: 1. Moderate diffuse thickening of jejunum since prior exam suspicious for enteritis. No mechanical bowel obstruction or pneumatosis. 2. Slight increase in small bilateral pleural  effusions. Bibasilar atelectasis without pneumonic consolidations. 3. Small fat containing umbilical hernia. Electronically Signed   By: Ashley Royalty M.D.   On: 04/02/2016 22:51   Nm Pulmonary Perf And Vent  Result Date: 03/29/2016 CLINICAL DATA:  Acute respiratory failure, hypoxia, cough EXAM: NUCLEAR MEDICINE VENTILATION - PERFUSION LUNG SCAN TECHNIQUE: Ventilation images were obtained in multiple projections using inhaled aerosol [id number]. Perfusion images were obtained in multiple projections after intravenous injection of Tc-962mAA. RADIOPHARMACEUTICALS:  32.5 mCi Technetium-9972mPA aerosol inhalation and 4.32 mCi Technetium-25m6m IV COMPARISON:  None. FINDINGS: Ventilation: Mildly heterogeneous.  No focal ventilation defect. Perfusion: No wedge shaped peripheral perfusion defects to suggest acute pulmonary embolism. IMPRESSION: Negative for pulmonary embolism. Electronically Signed   By: SriyJulian Hy.   On: 03/29/2016 17:50   Dg Abd Portable 1v  Result Date: 04/01/2016 CLINICAL DATA:  Vomiting episodes.  Abdominal pain . EXAM: PORTABLE ABDOMEN - 1 VIEW COMPARISON:  CT 03/28/2016. FINDINGS: Soft tissue structures are unremarkable. Surgical clips right upper quadrant. No bowel distention. No free air. Calcifications in pelvis consistent phleboliths. Degenerative changes lumbar spine and both hips. IMPRESSION: No acute or focal abnormality. Electronically Signed   By: ThomMarcello Mooresgister  On: 04/01/2016 12:18   Ct Head Code Stroke W/o Cm  Result Date: 04/23/2016 CLINICAL DATA:  Code stroke. Acute onset of slurred speech and weakness. EXAM: CT HEAD WITHOUT CONTRAST TECHNIQUE: Contiguous axial images were obtained from the base of the skull through the vertex without intravenous contrast. COMPARISON:  CT head without contrast 04/01/2016 FINDINGS: Brain: No acute infarct, hemorrhage, or mass lesion is present. The ventricles are of normal size. No significant extraaxial fluid collection is  present. No significant white matter disease is present. The brainstem and cerebellum are within normal limits. Vascular: No hyperdense vessel or unexpected calcification. Skull: The calvarium is within normal limits. No focal lytic or blastic lesions are present. Sinuses/Orbits: The paranasal sinuses are clear. Debris in the external auditory canals is again noted. Go ASPECTS [facility] Stroke Program Early CT Score) - Ganglionic level infarction (caudate, lentiform nuclei, internal capsule, insula, M1-M3 cortex): 7/7 - Supraganglionic infarction (M4-M6 cortex): 3/3 Total score (0-10 with 10 being normal): 10/10 IMPRESSION: 1. Normal CT appearance of the brain. No acute or focal lesion to explain abnormal speech or weakness. 2. ASPECTS is 10/10 Electronically Signed   By: San Morelle M.D.   On: 04/23/2016 14:20     PERTINENT LAB RESULTS: CBC:  Recent Labs  04/23/16 1429 04/23/16 1450 04/24/16 0456  WBC 10.9*  --  7.9  HGB 9.6* 10.5* 9.0*  HCT 29.6* 31.0* 27.5*  PLT 301  --  242   CMET CMP     Component Value Date/Time   NA 134 (L) 04/25/2016 0222   NA 135 (A) 04/16/2016   NA 137 03/26/2015 0934   K 3.7 04/25/2016 0222   K 4.4 03/26/2015 0934   CL 102 04/25/2016 0222   CL 115 (H) 11/15/2013 0500   CL 104 04/13/2012 1609   CO2 20 (L) 04/25/2016 0222   CO2 20 (L) 03/26/2015 0934   GLUCOSE 189 (H) 04/25/2016 0222   GLUCOSE 124 03/26/2015 0934   GLUCOSE 133 (H) 04/13/2012 1609   BUN 17 04/25/2016 0222   BUN 8 04/16/2016   BUN 30.8 (H) 03/26/2015 0934   CREATININE 1.22 (H) 04/25/2016 0222   CREATININE 1.1 03/26/2015 0934   CALCIUM 8.8 (L) 04/25/2016 0222   CALCIUM 9.0 03/26/2015 0934   PROT 6.1 (L) 04/23/2016 1429   PROT 6.9 03/26/2015 0934   ALBUMIN 3.3 (L) 04/23/2016 1429   ALBUMIN 3.2 (L) 03/26/2015 0934   AST 19 04/23/2016 1429   AST 13 03/26/2015 0934   ALT 17 04/23/2016 1429   ALT 18 03/26/2015 0934   ALKPHOS 60 04/23/2016 1429   ALKPHOS 84 03/26/2015 0934    BILITOT 0.6 04/23/2016 1429   BILITOT <0.30 03/26/2015 0934   GFRNONAA 48 (L) 04/25/2016 0222   GFRNONAA 41 (L) 11/15/2013 0500   GFRAA 55 (L) 04/25/2016 0222   GFRAA 50 (L) 11/15/2013 0500    GFR Estimated Creatinine Clearance: 70.5 mL/min (by C-G formula based on SCr of 1.22 mg/dL (H)). No results for input(s): LIPASE, AMYLASE in the last 72 hours. No results for input(s): CKTOTAL, CKMB, CKMBINDEX, TROPONINI in the last 72 hours. Invalid input(s): POCBNP No results for input(s): DDIMER in the last 72 hours. No results for input(s): HGBA1C in the last 72 hours. No results for input(s): CHOL, HDL, LDLCALC, TRIG, CHOLHDL, LDLDIRECT in the last 72 hours. No results for input(s): TSH, T4TOTAL, T3FREE, THYROIDAB in the last 72 hours.  Invalid input(s): FREET3 No results for input(s): VITAMINB12, FOLATE, FERRITIN, TIBC, IRON, RETICCTPCT in  the last 72 hours. Coags:  Recent Labs  04/23/16 1429  INR 1.06   Microbiology: No results found for this or any previous visit (from the past 240 hour(s)).  FURTHER DISCHARGE INSTRUCTIONS:  Get Medicines reviewed and adjusted: Please take all your medications with you for your next visit with your Primary MD  Laboratory/radiological data: Please request your Primary MD to go over all hospital tests and procedure/radiological results at the follow up, please ask your Primary MD to get all Hospital records sent to his/her office.  In some cases, they will be blood work, cultures and biopsy results pending at the time of your discharge. Please request that your primary care M.D. goes through all the records of your hospital data and follows up on these results.  Also Note the following: If you experience worsening of your admission symptoms, develop shortness of breath, life threatening emergency, suicidal or homicidal thoughts you must seek medical attention immediately by calling 911 or calling your MD immediately  if symptoms less  severe.  You must read complete instructions/literature along with all the possible adverse reactions/side effects for all the Medicines you take and that have been prescribed to you. Take any new Medicines after you have completely understood and accpet all the possible adverse reactions/side effects.   Do not drive when taking Pain medications or sleeping medications (Benzodaizepines)  Do not take more than prescribed Pain, Sleep and Anxiety Medications. It is not advisable to combine anxiety,sleep and pain medications without talking with your primary care practitioner  Special Instructions: If you have smoked or chewed Tobacco  in the last 2 yrs please stop smoking, stop any regular Alcohol  and or any Recreational drug use.  Wear Seat belts while driving.  Please note: You were cared for by a hospitalist during your hospital stay. Once you are discharged, your primary care physician will handle any further medical issues. Please note that NO REFILLS for any discharge medications will be authorized once you are discharged, as it is imperative that you return to your primary care physician (or establish a relationship with a primary care physician if you do not have one) for your post hospital discharge needs so that they can reassess your need for medications and monitor your lab values.  Total Time spent coordinating discharge including counseling, education and face to face time equals  45 minutes  Signed: Oren Binet 04/25/2016 9:59 AM

## 2016-04-28 ENCOUNTER — Non-Acute Institutional Stay (SKILLED_NURSING_FACILITY): Payer: Medicare Other | Admitting: Adult Health

## 2016-04-28 DIAGNOSIS — Z794 Long term (current) use of insulin: Secondary | ICD-10-CM

## 2016-04-28 DIAGNOSIS — F200 Paranoid schizophrenia: Secondary | ICD-10-CM

## 2016-04-28 DIAGNOSIS — K219 Gastro-esophageal reflux disease without esophagitis: Secondary | ICD-10-CM

## 2016-04-28 DIAGNOSIS — D649 Anemia, unspecified: Secondary | ICD-10-CM

## 2016-04-28 DIAGNOSIS — R5381 Other malaise: Secondary | ICD-10-CM | POA: Diagnosis not present

## 2016-04-28 DIAGNOSIS — N179 Acute kidney failure, unspecified: Secondary | ICD-10-CM | POA: Diagnosis not present

## 2016-04-28 DIAGNOSIS — F419 Anxiety disorder, unspecified: Secondary | ICD-10-CM

## 2016-04-28 DIAGNOSIS — G4733 Obstructive sleep apnea (adult) (pediatric): Secondary | ICD-10-CM

## 2016-04-28 DIAGNOSIS — E871 Hypo-osmolality and hyponatremia: Secondary | ICD-10-CM

## 2016-04-28 DIAGNOSIS — E785 Hyperlipidemia, unspecified: Secondary | ICD-10-CM | POA: Diagnosis not present

## 2016-04-28 DIAGNOSIS — K5909 Other constipation: Secondary | ICD-10-CM

## 2016-04-28 DIAGNOSIS — I10 Essential (primary) hypertension: Secondary | ICD-10-CM | POA: Diagnosis not present

## 2016-04-28 DIAGNOSIS — G9340 Encephalopathy, unspecified: Secondary | ICD-10-CM | POA: Diagnosis not present

## 2016-04-28 DIAGNOSIS — R600 Localized edema: Secondary | ICD-10-CM

## 2016-04-28 DIAGNOSIS — E038 Other specified hypothyroidism: Secondary | ICD-10-CM

## 2016-04-28 DIAGNOSIS — J45909 Unspecified asthma, uncomplicated: Secondary | ICD-10-CM | POA: Diagnosis not present

## 2016-04-28 DIAGNOSIS — G894 Chronic pain syndrome: Secondary | ICD-10-CM

## 2016-04-28 DIAGNOSIS — E118 Type 2 diabetes mellitus with unspecified complications: Secondary | ICD-10-CM

## 2016-04-29 ENCOUNTER — Encounter: Payer: Self-pay | Admitting: Internal Medicine

## 2016-04-29 ENCOUNTER — Encounter: Payer: Self-pay | Admitting: Adult Health

## 2016-04-29 ENCOUNTER — Non-Acute Institutional Stay (SKILLED_NURSING_FACILITY): Payer: Medicare Other | Admitting: Internal Medicine

## 2016-04-29 DIAGNOSIS — G9340 Encephalopathy, unspecified: Secondary | ICD-10-CM | POA: Diagnosis not present

## 2016-04-29 DIAGNOSIS — K219 Gastro-esophageal reflux disease without esophagitis: Secondary | ICD-10-CM | POA: Diagnosis not present

## 2016-04-29 DIAGNOSIS — E785 Hyperlipidemia, unspecified: Secondary | ICD-10-CM | POA: Diagnosis not present

## 2016-04-29 DIAGNOSIS — E1142 Type 2 diabetes mellitus with diabetic polyneuropathy: Secondary | ICD-10-CM | POA: Diagnosis not present

## 2016-04-29 DIAGNOSIS — E871 Hypo-osmolality and hyponatremia: Secondary | ICD-10-CM | POA: Diagnosis not present

## 2016-04-29 DIAGNOSIS — R531 Weakness: Secondary | ICD-10-CM

## 2016-04-29 DIAGNOSIS — I10 Essential (primary) hypertension: Secondary | ICD-10-CM | POA: Diagnosis not present

## 2016-04-29 DIAGNOSIS — F209 Schizophrenia, unspecified: Secondary | ICD-10-CM

## 2016-04-29 DIAGNOSIS — N183 Chronic kidney disease, stage 3 unspecified: Secondary | ICD-10-CM

## 2016-04-29 DIAGNOSIS — E118 Type 2 diabetes mellitus with unspecified complications: Secondary | ICD-10-CM | POA: Diagnosis not present

## 2016-04-29 DIAGNOSIS — Z794 Long term (current) use of insulin: Secondary | ICD-10-CM

## 2016-04-29 DIAGNOSIS — E038 Other specified hypothyroidism: Secondary | ICD-10-CM | POA: Diagnosis not present

## 2016-04-29 DIAGNOSIS — G8929 Other chronic pain: Secondary | ICD-10-CM | POA: Diagnosis not present

## 2016-04-29 DIAGNOSIS — G4733 Obstructive sleep apnea (adult) (pediatric): Secondary | ICD-10-CM | POA: Diagnosis not present

## 2016-04-29 DIAGNOSIS — K5909 Other constipation: Secondary | ICD-10-CM

## 2016-04-29 DIAGNOSIS — Z9989 Dependence on other enabling machines and devices: Secondary | ICD-10-CM

## 2016-04-29 DIAGNOSIS — D638 Anemia in other chronic diseases classified elsewhere: Secondary | ICD-10-CM

## 2016-04-29 NOTE — Progress Notes (Signed)
LOCATION: [place] [facility] [facility]   PCP: [name], MD   Code Status: Full Code  Goals of care: Advanced Directive information Advanced Directives 04/24/2016  Does Patient Have a Medical Advance Directive? No  Type of Advance Directive -  Does patient want to make changes to medical advance directive? No - Patient declined  Copy of [place] in Chart? -  Would patient like information on creating a medical advance directive? No - Patient declined  Pre-existing out of facility DNR order (yellow form or pink MOST form) -       Extended Emergency Contact Information Primary Emergency Contact: Mann,Darnell Address: [place], [place] [address] [place] [employer] Phone: [phone] Relation: Son Secondary Emergency Contact: Mann,Christiane          Raelene Bott of [place] Phone: [phone] Mobile Phone: [phone] Relation: Daughter   Allergies  Allergen Reactions  . Bee Venom Anaphylaxis  . Shellfish Allergy Anaphylaxis  . Buprenorphine Hcl Other (See Comments)    Overly sedated    Chief Complaint  Patient presents with  . New Admit To SNF    New Admission Visit     HPI:  Patient is a 61 y.o. female seen today for short term rehabilitation post hospital admission from 04/23/2016-04/25/2016 with altered mental status thought to be from polypharmacy. CAT scan of the head was negative for acute intracranial abnormality. Her narcotics were discontinued. Patient was also found to have acute kidney injury with hyponatremia responded well to IV fluids. She has medical history of hypertension, hypothyroidism, GERD, obstructive sleep apnea, type 2 diabetes mellitus with neuropathy, chronic pain among others. She is seen in her room today.  Review of Systems:  Constitutional: Negative for fever, chills, diaphoresis. Energy level is slowly coming back. HENT: Negative for headache, congestion, nasal  discharge,difficulty swallowing.   Eyes: Negative for blurred vision, double vision and discharge.  Respiratory: Negative for cough, shortness of breath and wheezing.   Cardiovascular: Negative for chest pain, palpitations, leg swelling.  Gastrointestinal: Negative for heartburn, nausea, vomiting, abdominal pain, loss of appetite, melena. Last bowel movement was this morning.  Genitourinary: Negative for dysuria.  Musculoskeletal: Negative for fall in the facility. Positive for chronic lower back pain that radiates down to her legs and feet. She complaints of multiple joint pain.  Skin: Negative for itching, rash.  Neurological: Negative for dizziness. Positive for weakness to both legs with numbness and tingling in her left thigh. Psychiatric/Behavioral: Negative for depression   Past Medical History:  Diagnosis Date  . Anemia   . Anxiety   . Arthritis    "pretty much all my joints"  . Asthma   . Benign paroxysmal positional vertigo 12/30/2012  . Breast cancer ([place]) 02/13/12   ruq  100'clock bx Ductal Carcinoma in Situ,(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  . Breast mass in female    L breast 2008, US showed likely fat necrosis  . Chronic lower back pain   . CKD (chronic kidney disease), stage III    "lower stage" (01/06/2014)  . Daily headache   . Depression   . GERD (gastroesophageal reflux disease)   . History of blood transfusion 2011   "after one of my OR's"  . History of hiatal hernia   . History of stomach ulcers   . HSV (herpes simplex virus) infection   . Hx of radiation therapy 05/05/12- 07/15/12   right  breast, 45 gray x 25 fx, lumpectomy cavity boosted to 16.2 gray  . Hyperlipemia   . Hypertension    sees Dr. Criss Rosales , [facility]  . Hypothyroidism   . Kidney stones   . Knee pain, bilateral   . Migraines    "~ 3 times/month" (01/06/2014)  . Obesity   . OSA on CPAP    pt does not know settings  . Pancreatitis    hx of  . Pneumonia 1950's  . Polyneuropathy in  diabetes(357.2)   . Schizophrenia ([place])   . 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 Tachycardia    with sx in 2008  . Thyroid disease   . Type II diabetes mellitus ([place])    Past Surgical History:  Procedure Laterality Date  . ABDOMINAL HYSTERECTOMY  1979?   partial  . BREAST LUMPECTOMY  03/03/2012   Procedure: LUMPECTOMY;  Surgeon: Haywood Lasso, MD;  Location: [facility];  Service: General;  Laterality: Right;  . BREAST SURGERY Right 01/2012   "cancer"  . CHOLECYSTECTOMY  1980's?  . COLONOSCOPY WITH PROPOFOL N/A 09/28/2012   Procedure: COLONOSCOPY WITH PROPOFOL;  Surgeon: Lear Ng, MD;  Location: [facility];  Service: Endoscopy;  Laterality: N/A;  . FOOT FRACTURE SURGERY Right 1990's  . JOINT REPLACEMENT    . REVISION TOTAL KNEE ARTHROPLASTY  07/2009  . SHOULDER OPEN ROTATOR CUFF REPAIR Left 1990's  . THYROIDECTOMY  1970's  . TOTAL KNEE ARTHROPLASTY Bilateral 2009-06/2009   left; right   Social History:   reports that she quit smoking about 24 years ago. Her smoking use included Cigarettes. She has a 7.00 pack-year smoking history. She has never used smokeless tobacco. She reports that she does not drink alcohol or use drugs.  Family History  Problem Relation Age of Onset  . Heart disease Brother     Multiple MIs, starting in his 16s  . Diabetes Brother   . Hypertension Mother   . Diabetes Mother   . Breast cancer Mother 40  . Bone cancer Mother   . Hypertension Sister   . Diabetes Sister   . Breast cancer Sister 77  . Breast cancer Maternal Grandmother   . Heart disease Maternal Grandmother   . Uterine cancer Other 19  . Breast cancer Paternal Aunt 80  . Breast cancer Paternal Grandmother     dx in her 65s  . Prostate cancer Paternal Grandfather     Medications: Allergies as of 04/29/2016      Reactions   Bee Venom Anaphylaxis   Shellfish Allergy Anaphylaxis   Buprenorphine Hcl Other (See Comments)   Overly sedated      Medication List         Accurate as of 04/29/16 12:21 PM. Always use your most recent med list.          PROAIR HFA 108 (90 Base) MCG/ACT inhaler Generic drug:  albuterol Inhale 2 puffs into the lungs every 4 (four) hours as needed for wheezing or shortness of breath.   albuterol (2.5 MG/3ML) 0.083% nebulizer solution Commonly known as:  PROVENTIL Inhale 3 mLs into the lungs every 6 (six) hours as needed. For shortness of breath   aspirin EC 81 MG tablet Take 1 tablet (81 mg total) by mouth daily.   B-complex with vitamin C tablet Take 1 tablet by mouth daily.   carvedilol 3.125 MG tablet Commonly known as:  COREG Take 1 tablet (3.125 mg total) by mouth 2 (two) times daily with a meal.  docusate sodium 100 MG capsule Commonly known as:  COLACE Take 100 mg by mouth at bedtime.   esomeprazole 40 MG capsule Commonly known as:  NEXIUM Take 1 capsule (40 mg total) by mouth daily before breakfast.   furosemide 40 MG tablet Commonly known as:  LASIX Take 1 tablet (40 mg total) by mouth daily.   insulin aspart 100 UNIT/ML injection Commonly known as:  novoLOG Inject 0-9 Units into the skin 3 (three) times daily with meals. 0-9 Units, Subcutaneous CBG < 70: implement hypoglycemia protocol CBG 70 - 120: 0 units CBG 121 - 150: 1 unit CBG 151 - 200: 2 units CBG 201 - 250: 3 units CBG 251 - 300: 5 units CBG 301 - 350: 7 units CBG 351 - 400: 9 units CBG > 400: call MD   LEVEMIR FLEXTOUCH 100 UNIT/ML Pen Generic drug:  Insulin Detemir Inject 10 Units into the skin at bedtime.   levothyroxine 150 MCG tablet Commonly known as:  SYNTHROID, LEVOTHROID Take 150 mcg by mouth daily before breakfast.   LINZESS 290 MCG Caps capsule Generic drug:  linaclotide Take 290 mcg by mouth daily before breakfast.   LORazepam 0.5 MG tablet Commonly known as:  ATIVAN Take 0.25 mg by mouth every morning.   LORazepam 0.5 MG tablet Commonly known as:  ATIVAN Take 0.5 mg by mouth at bedtime as needed for anxiety.    multivitamin with minerals Tabs tablet Take 1 tablet by mouth daily.   pregabalin 200 MG capsule Commonly known as:  LYRICA Take 1 capsule (200 mg total) by mouth daily.   rosuvastatin 20 MG tablet Commonly known as:  CRESTOR Take 20 mg by mouth daily.   venlafaxine XR 150 MG 24 hr capsule Commonly known as:  EFFEXOR-XR Take 150 mg by mouth 2 (two) times daily.   Vitamin D-3 1000 units Caps Take 1,000 Units by mouth daily.   ziprasidone 20 MG capsule Commonly known as:  GEODON Take 1 capsule (20 mg total) by mouth 2 (two) times daily with a meal.       Immunizations: Immunization History  Administered Date(s) Administered  . Influenza Whole 10/12/2009  . Influenza,inj,Quad PF,36+ Mos 02/16/2016  . Influenza-Unspecified 11/17/2013  . PPD Test 04/07/2016  . Pneumococcal Polysaccharide-23 10/12/2009, 02/16/2016     Physical Exam: Vitals:   04/29/16 1212  BP: (!) 142/95  Pulse: (!) 104  Resp: 20  Temp: 97.3 F (36.3 C)  TempSrc: Oral  SpO2: 99%  Weight: (!) 307 lb 8 oz (139.5 kg)  Height: _0  (1.651 m)   Body mass index is 51.17 kg/m.  Bedside HR 90/min  General- adult female, morbidly obese, in no acute distress Head- normocephalic, atraumatic Nose- no nasal discharge Throat- moist mucus membrane, normal oropharynx Eyes- PERRLA, EOMI, no pallor, no icterus, no discharge, normal conjunctiva, normal sclera Neck- no cervical lymphadenopathy Cardiovascular- normal s1,s2, no murmur Respiratory- bilateral clear to auscultation, no wheeze, no rhonchi, no crackles, no use of accessory muscles Abdomen- bowel sounds present, soft, non tender, no guarding or rigidity Musculoskeletal- able to move all 4 extremities, generalized weakness, trace leg edema, lumbar paraspinal tenderness on exam Neurological- alert and oriented to person, place and time Skin- warm and dry Psychiatry- normal mood and affect    Labs reviewed: Basic Metabolic Panel:  Recent  Labs  03/30/16 1113  04/01/16 0432 04/02/16 1639  04/23/16 1429 04/23/16 1450 04/24/16 0456 04/25/16 0222  NA 145  --  145  --   < >  124* 124* 129* 134*  K 4.0  --  3.6  --   < > 3.6 3.7 3.4* 3.7  CL 112*  --  113*  --   < > 89* 89* 94* 102  CO2 24  --  23  --   < > 22  --  24 20*  GLUCOSE 205*  --  196*  --   < > 113* 112* 103* 189*  BUN 23*  --  32*  --   < > 28* 27* 24* 17  CREATININE 1.12*  < > 1.09*  --   < > 2.02* 2.10* 1.82* 1.22*  CALCIUM 9.2  --  8.5*  --   < > 8.7*  --  8.8* 8.8*  MG 1.6*  --  2.0 2.0  --   --   --   --   --   < > = values in this interval not displayed. Liver Function Tests:  Recent Labs  04/01/16 0432 04/03/16 0517 04/23/16 1429  AST _0 ALT _1 ALKPHOS 59 53 60  BILITOT 0.7 0.9 0.6  PROT 6.1* 5.5* 6.1*  ALBUMIN 2.9* 3.0* 3.3*    Recent Labs  06/23/15 0754 11/05/15 0255 02/13/16 1905  LIPASE _2 No results for input(s): AMMONIA in the last 8760 hours. CBC:  Recent Labs  03/27/16 1850  03/28/16 0917  04/01/16 0432 04/10/16 04/23/16 1429 04/23/16 1450 04/24/16 0456  WBC 14.8*  --  10.2  10.3  < > 15.3* 11.0 10.9*  --  7.9  NEUTROABS 12.2*  --  8.3*  --   --   --  6.9  --   --   HGB 7.8*  < > 7.6*  7.6*  < > 10.4* 9.2* 9.6* 10.5* 9.0*  HCT 24.8*  < > 24.0*  23.8*  < > 33.1* 29* 29.6* 31.0* 27.5*  MCV 82.7  --  82.8  82.6  < > 82.8  --  80.2  --  80.4  PLT 252  --  201  206  < > 229 322 301  --  242  < > = values in this interval not displayed. Cardiac Enzymes:  Recent Labs  06/24/15 1211 06/24/15 1747 02/29/16 1127  TROPONINI 0.30* 0.05* <0.03   BNP: Invalid input(s): POCBNP CBG:  Recent Labs  04/25/16 0402 04/25/16 0843 04/25/16 1109  GLUCAP 140* 168* 143*    Radiological Exams: Dg Abd 1 View  Result Date: 04/02/2016 CLINICAL DATA:  Nausea and vomiting starting yesterday EXAM: ABDOMEN - 1 VIEW COMPARISON:  04/01/2016 FINDINGS: Mild gaseous distended small bowel loops are noted in left  mid abdomen suspicious for ileus, enteritis or early bowel obstruction. Some colonic gas noted in right colon and transverse colon. Colonic gas noted within rectum. IMPRESSION: Mild gaseous distended small bowel loops in left mid abdomen suspicious for mild ileus enteritis or less likely small bowel obstruction. Electronically Signed   By: Lahoma Crocker M.D.   On: 04/02/2016 10:36   Ct Head Wo Contrast  Result Date: 04/01/2016 CLINICAL DATA:  Acute onset of altered mental status. Initial encounter. EXAM: CT HEAD WITHOUT CONTRAST TECHNIQUE: Contiguous axial images were obtained from the base of the skull through the vertex without intravenous contrast. COMPARISON:  CT of the head performed 03/27/2016, and MRI of the brain performed 02/29/2016 FINDINGS: Brain: No evidence of acute infarction, hemorrhage, hydrocephalus, extra-axial collection or mass lesion/mass effect. Prominence of the ventricles and sulci reflects  mild cortical volume loss. Mild cerebellar atrophy is noted. The brainstem and fourth ventricle are within normal limits. The basal ganglia are unremarkable in appearance. The cerebral hemispheres demonstrate grossly normal gray-white differentiation. No mass effect or midline shift is seen. Vascular: No hyperdense vessel or unexpected calcification. Skull: There is no evidence of fracture; visualized osseous structures are unremarkable in appearance. A mildly prominent empty sella is noted. Sinuses/Orbits: The orbits are within normal limits. The paranasal sinuses and mastoid air cells are well-aerated. Other: No significant soft tissue abnormalities are seen. Prominent cerumen is seen at the external auditory canals bilaterally, larger on the right. IMPRESSION: 1. No acute intracranial pathology seen on CT. 2. Mild cortical volume loss noted. 3. Prominent cerumen at the external auditory canals bilaterally. Electronically Signed   By: Garald Balding M.D.   On: 04/01/2016 21:53   Ct Abdomen Pelvis W  Contrast  Result Date: 04/02/2016 CLINICAL DATA:  Pneumonia, altered mental status.  Abdominal pain. EXAM: CT ABDOMEN AND PELVIS WITH CONTRAST TECHNIQUE: Multidetector CT imaging of the abdomen and pelvis was performed using the standard protocol following bolus administration of intravenous contrast. CONTRAST:  100 cc Isovue 300 IV COMPARISON:  Same day KUB, CT from 03/28/2016 FINDINGS: Lower chest: Small pleural effusions bilaterally slightly increased since prior exam with adjacent atelectasis. Borderline cardiomegaly. No pericardial effusion. Hepatobiliary: Cholecystectomy. No focal liver lesion or biliary dilatation. Pancreas: Fatty atrophy of the pancreas without focal mass or ductal dilatation. No surrounding peripancreatic inflammation. Spleen: Normal size without focal abnormality. Adrenals/Urinary Tract: Normal bilateral adrenal glands and kidneys without obstructive uropathy. Physiologically distended appearance of the bladder. Stomach/Bowel: Contracted stomach. Normal small bowel rotation. Mild jejunal thickening without significant fluid-filled distention. Enteric contrast passes into large bowel. No evidence acute appendicitis. Vascular/Lymphatic: Aortic atherosclerosis. No enlarged abdominal or pelvic lymph nodes. Reproductive: Status post hysterectomy. No adnexal masses. Other: Small fat containing umbilical hernia. No free air nor free fluid. Left lower quadrant iatrogenic subcutaneous emphysema likely related injection subcutaneous medication. Musculoskeletal: Degenerate changes of the dorsal spine. No acute nor suspicious osseous lesions. IMPRESSION: 1. Moderate diffuse thickening of jejunum since prior exam suspicious for enteritis. No mechanical bowel obstruction or pneumatosis. 2. Slight increase in small bilateral pleural effusions. Bibasilar atelectasis without pneumonic consolidations. 3. Small fat containing umbilical hernia. Electronically Signed   By: Ashley Royalty M.D.   On: 04/02/2016  22:51   Dg Abd Portable 1v  Result Date: 04/01/2016 CLINICAL DATA:  Vomiting episodes.  Abdominal pain . EXAM: PORTABLE ABDOMEN - 1 VIEW COMPARISON:  CT 03/28/2016. FINDINGS: Soft tissue structures are unremarkable. Surgical clips right upper quadrant. No bowel distention. No free air. Calcifications in pelvis consistent phleboliths. Degenerative changes lumbar spine and both hips. IMPRESSION: No acute or focal abnormality. Electronically Signed   By: Marcello Moores  Register   On: 04/01/2016 12:18   Ct Head Code Stroke W/o Cm  Result Date: 04/23/2016 CLINICAL DATA:  Code stroke. Acute onset of slurred speech and weakness. EXAM: CT HEAD WITHOUT CONTRAST TECHNIQUE: Contiguous axial images were obtained from the base of the skull through the vertex without intravenous contrast. COMPARISON:  CT head without contrast 04/01/2016 FINDINGS: Brain: No acute infarct, hemorrhage, or mass lesion is present. The ventricles are of normal size. No significant extraaxial fluid collection is present. No significant white matter disease is present. The brainstem and cerebellum are within normal limits. Vascular: No hyperdense vessel or unexpected calcification. Skull: The calvarium is within normal limits. No focal lytic or blastic lesions are present.  Sinuses/Orbits: The paranasal sinuses are clear. Debris in the external auditory canals is again noted. Go ASPECTS [facility] Stroke Program Early CT Score) - Ganglionic level infarction (caudate, lentiform nuclei, internal capsule, insula, M1-M3 cortex): 7/7 - Supraganglionic infarction (M4-M6 cortex): 3/3 Total score (0-10 with 10 being normal): 10/10 IMPRESSION: 1. Normal CT appearance of the brain. No acute or focal lesion to explain abnormal speech or weakness. 2. ASPECTS is 10/10 Electronically Signed   By: San Morelle M.D.   On: 04/23/2016 14:20    Assessment/Plan  Generalized weakness From deconditioning. Will have patient work with PT/OT as tolerated to regain  strength and restore function.  Fall precautions are in place.  Encephalopathy Resolved. aaox 3 this visit. Avoid sedative drugs where possible  Chronic pain To her back and multiple joints. Get PMR consult.  ckd stage 3 s/p iv fluid in hospital for AKI. Monitor bmp  Anemia of chronic disease With ckd and other chronic medical problems. Monitor cbc  Hyponatremia Check bmp with her on lasix  Hypertension Monitor BP q shift for now. Her pain could be contributing some to elevated BP reading. Continue baby aspirin, carvedilol 3.125 mg bid with lasix 40 mg daily  OSA  On CPAP. Add cepacol q6h prn x 1 week for scratchy throat and monitor  Neuropathy On lyrica 200 mg daily, monitor clinically, fall precautions and gait training  HLD Continue crestor  Type 2 dm with neuropathy Monitor blood sugar. Continue levemir 10 u daily and SSI novlog. Continue aspirin and statin  gerd Stable, continue nexium, no changes made  Chronic constipation On linzess and colace, maintain hydration  Hypothyroidism Continue levothyroxine  Schizophrenia Mood appears stable this visit, continue geodon and effexor with low dose benzodiazepine. Monitor for sedation and acute behavior change.      Goals of care: short term rehabilitation   Labs/tests ordered: cbc, bmp  Family/ staff Communication: reviewed care plan with patient and nursing supervisor    Blanchie Serve, MD Internal Medicine [place], [place] [address] Cell Phone (Monday-Friday 8 am - 5 pm): [phone] On Call: [phone] and follow prompts after 5 pm and on weekends Office Phone: [phone] Office Fax: [phone]

## 2016-04-29 NOTE — Progress Notes (Addendum)
DATE:  04/28/2016  MRN:  [id number]  BIRTHDAY: 1955-05-29  Facility:  [facility] Home Location:  [place] [facility] [place] Room Number: 8453-M  LEVEL OF CARE:  SNF (31)  Contact Information    Name Relation Home Work [place] Son   [phone]   Mann,Christiane Daughter [phone]  [phone]       Code Status History    Date Active Date Inactive Code Status Order ID Comments User Context   04/23/2016  7:44 PM 04/25/2016  7:35 PM Full Code [id number]  Jonetta Osgood, MD ED   03/28/2016 12:17 AM 04/07/2016  7:19 PM Full Code [id number]  Karmen Bongo, MD ED   02/29/2016  5:07 PM 03/03/2016  8:15 PM Partial Code [id number]  Caren Griffins, MD ED   02/13/2016 11:49 PM 02/17/2016  9:42 PM Full Code [id number]  Lily Kocher, MD Inpatient   06/23/2015 12:10 PM 06/27/2015 10:52 PM Full Code [id number]  Rondel Jumbo, PA-C ED   11/12/2014 10:00 PM 11/15/2014  4:54 PM Full Code [id number]  Ivor Costa, MD ED   10/28/2014  5:02 AM 10/30/2014  9:46 PM Full Code [id number]  Theressa Millard, MD ED   08/14/2014  7:09 AM 08/16/2014  3:50 PM Full Code [id number]  Lavina Hamman, MD ED   01/06/2014  4:34 PM 01/07/2014  3:05 PM DNR [id number]  Domenic Polite, MD Inpatient   08/08/2013  7:43 PM 08/12/2013  8:44 PM Full Code [id number]  Rigoberto Noel, MD ED   06/04/2011  3:36 AM 06/05/2011  7:23 PM Full Code [id number]  Lutricia Feil, RN Inpatient       Chief Complaint  Patient presents with  . Hospitalization Follow-up    HISTORY OF PRESENT ILLNESS:  This is a 62-YO female seen for hospital follow-up.  She was readmitted to [facility] on 04/25/2016 for short-term rehabilitation following an admission at [facility] 04/24/1026-04/25/2016 for confusion and lethargy, diagnosed to be acute toxic encephalopathy probably due to polypharmacy. CT head negative. Narcotic medications were discontinued.  Of note, she was recently discharged from [facility] after a short-term  rehabilitation post hospitalization 03/27/2016-04/07/2016 for AMS secondary to polypharmacy, PNA, and collitis.   She was seen in her room today and verbalized severe pain on her left hip. No edema has been noted.    PAST MEDICAL HISTORY:  Past Medical History:  Diagnosis Date  . Anemia   . Anxiety   . Arthritis    "pretty much all my joints"  . Asthma   . Benign paroxysmal positional vertigo 12/30/2012  . Breast cancer ([place]) 02/13/12   ruq  100'clock bx Ductal Carcinoma in Situ,(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  . Breast mass in female    L breast 2008, US showed likely fat necrosis  . Chronic lower back pain   . CKD (chronic kidney disease), stage III    "lower stage" (01/06/2014)  . Daily headache   . Depression   . GERD (gastroesophageal reflux disease)   . History of blood transfusion 2011   "after one of my OR's"  . History of hiatal hernia   . History of stomach ulcers   . HSV (herpes simplex virus) infection   . Hx of radiation therapy 05/05/12- 07/15/12   right breast, 45 gray x 25 fx, lumpectomy cavity boosted to 16.2 gray  . Hyperlipemia   . Hypertension    sees Dr. Criss Rosales , [facility]  . Hypothyroidism   .  Kidney stones   . Knee pain, bilateral   . Migraines    "~ 3 times/month" (01/06/2014)  . Obesity   . OSA on CPAP    pt does not know settings  . Pancreatitis    hx of  . Pneumonia 1950's  . Polyneuropathy in diabetes(357.2)   . Schizophrenia ([place])   . 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 Tachycardia    with sx in 2008  . Thyroid disease   . Type II diabetes mellitus ([facility])      CURRENT MEDICATIONS: Reviewed  Patient's Medications  New Prescriptions   No medications on file  Previous Medications   ALBUTEROL (PROAIR HFA) 108 (90 BASE) MCG/ACT INHALER    Inhale 2 puffs into the lungs every 4 (four) hours as needed for wheezing or shortness of breath.    ALBUTEROL (PROVENTIL) (2.5 MG/3ML) 0.083% NEBULIZER SOLUTION    Inhale 3 mLs into the lungs every 6  (six) hours as needed. For shortness of breath   ASPIRIN EC 81 MG TABLET    Take 1 tablet (81 mg total) by mouth daily.   B COMPLEX-C (B-COMPLEX WITH VITAMIN C) TABLET    Take 1 tablet by mouth daily.   CARVEDILOL (COREG) 3.125 MG TABLET    Take 1 tablet (3.125 mg total) by mouth 2 (two) times daily with a meal.   CHOLECALCIFEROL (VITAMIN D-3) 1000 UNITS CAPS    Take 1,000 Units by mouth daily.   DOCUSATE SODIUM (COLACE) 100 MG CAPSULE    Take 100 mg by mouth at bedtime.   ESOMEPRAZOLE (NEXIUM) 40 MG CAPSULE    Take 1 capsule (40 mg total) by mouth daily before breakfast.   FUROSEMIDE (LASIX) 40 MG TABLET    Take 1 tablet (40 mg total) by mouth daily.   INSULIN ASPART (NOVOLOG) 100 UNIT/ML INJECTION    Inject 0-9 Units into the skin 3 (three) times daily with meals. 0-9 Units, Subcutaneous CBG < 70: implement hypoglycemia protocol CBG 70 - 120: 0 units CBG 121 - 150: 1 unit CBG 151 - 200: 2 units CBG 201 - 250: 3 units CBG 251 - 300: 5 units CBG 301 - 350: 7 units CBG 351 - 400: 9 units CBG > 400: call MD   INSULIN DETEMIR (LEVEMIR FLEXTOUCH) 100 UNIT/ML PEN    Inject 10 Units into the skin at bedtime.    LEVOTHYROXINE (SYNTHROID, LEVOTHROID) 150 MCG TABLET    Take 150 mcg by mouth daily before breakfast.   LINACLOTIDE (LINZESS) 290 MCG CAPS CAPSULE    Take 290 mcg by mouth daily before breakfast.   LORAZEPAM (ATIVAN) 0.5 MG TABLET    Take 1 tablet (0.5 mg total) by mouth at bedtime as needed for anxiety. Take one-half tablet in the morning and one tablet by mouth nightly as needed for insomnia   MULTIPLE VITAMIN (MULTIVITAMIN WITH MINERALS) TABS TABLET    Take 1 tablet by mouth daily.   PREGABALIN (LYRICA) 200 MG CAPSULE    Take 1 capsule (200 mg total) by mouth daily.   ROSUVASTATIN (CRESTOR) 20 MG TABLET    Take 20 mg by mouth daily.   VENLAFAXINE XR (EFFEXOR-XR) 150 MG 24 HR CAPSULE    Take 150 mg by mouth 2 (two) times daily.   ZIPRASIDONE (GEODON) 20 MG CAPSULE    Take 1 capsule (20  mg total) by mouth 2 (two) times daily with a meal.  Modified Medications   No medications on file  Discontinued Medications  DOCUSATE SODIUM (COLACE) 50 MG CAPSULE    Take 100 mg by mouth at bedtime.   MULTIPLE VITAMINS-MINERALS (ONE-A-DAY WOMENS 50+ ADVANTAGE) TABS    Take 1 tablet by mouth daily.     Allergies  Allergen Reactions  . Bee Venom Anaphylaxis  . Shellfish Allergy Anaphylaxis  . Buprenorphine Hcl Other (See Comments)    Overly sedated     REVIEW OF SYSTEMS:  GENERAL: no change in appetite, no fatigue, no weight changes, no fever, chills or weakness EYES: Denies change in vision, dry eyes, eye pain, itching or discharge EARS: Denies change in hearing, ringing in ears, or earache NOSE: Denies nasal congestion or epistaxis MOUTH and THROAT: Denies oral discomfort, gingival pain or bleeding, pain from teeth or hoarseness   RESPIRATORY: no cough, SOB, DOE, wheezing, hemoptysis CARDIAC: no chest pain, edema or palpitations GI: no abdominal pain, diarrhea, constipation, heart burn, nausea or vomiting GU: Denies dysuria, frequency, hematuria, incontinence, or discharge PSYCHIATRIC: Denies feeling of depression or anxiety. No report of hallucinations, insomnia, paranoia, or agitation    PHYSICAL EXAMINATION  GENERAL APPEARANCE: Well nourished. In no acute distress. Morbidly obese SKIN:  Skin is warm and dry.  HEAD: Normal in size and contour. No evidence of trauma EYES: Lids open and close normally. No blepharitis, entropion or ectropion. PERRL. Conjunctivae are clear and sclerae are white. Lenses are without opacity EARS: Pinnae are normal. Patient hears normal voice tunes of the examiner MOUTH and THROAT: Lips are without lesions. Oral mucosa is moist and without lesions. Tongue is normal in shape, size, and color and without lesions NECK: supple, trachea midline, no neck masses, no thyroid tenderness, no thyromegaly LYMPHATICS: no LAN in the neck, no supraclavicular  LAN RESPIRATORY: breathing is even & unlabored, BS CTAB CARDIAC: RRR, no murmur,no extra heart sounds, no edema GI: abdomen soft, normal BS, no masses, no tenderness, no hepatomegaly, no splenomegaly EXTREMITIES:  Able to move 4 extremities PSYCHIATRIC: Alert and oriented X 3. Affect and behavior are appropriate  LABS/RADIOLOGY: Labs reviewed: Basic Metabolic Panel:  Recent Labs  03/30/16 1113  04/01/16 0432 04/02/16 1639  04/23/16 1429 04/23/16 1450 04/24/16 0456 04/25/16 0222  NA 145  --  145  --   < > 124* 124* 129* 134*  K 4.0  --  3.6  --   < > 3.6 3.7 3.4* 3.7  CL 112*  --  113*  --   < > 89* 89* 94* 102  CO2 24  --  23  --   < > 22  --  24 20*  GLUCOSE 205*  --  196*  --   < > 113* 112* 103* 189*  BUN 23*  --  32*  --   < > 28* 27* 24* 17  CREATININE 1.12*  < > 1.09*  --   < > 2.02* 2.10* 1.82* 1.22*  CALCIUM 9.2  --  8.5*  --   < > 8.7*  --  8.8* 8.8*  MG 1.6*  --  2.0 2.0  --   --   --   --   --   < > = values in this interval not displayed. Liver Function Tests:  Recent Labs  04/01/16 0432 04/03/16 0517 04/23/16 1429  AST _0 ALT _1 ALKPHOS 59 53 60  BILITOT 0.7 0.9 0.6  PROT 6.1* 5.5* 6.1*  ALBUMIN 2.9* 3.0* 3.3*    Recent Labs  06/23/15 0754 11/05/15 0255 02/13/16 1905  LIPASE _0 CBC:  Recent Labs  03/27/16 1850  03/28/16 0917  04/01/16 0432 04/10/16 04/23/16 1429 04/23/16 1450 04/24/16 0456  WBC 14.8*  --  10.2  10.3  < > 15.3* 11.0 10.9*  --  7.9  NEUTROABS 12.2*  --  8.3*  --   --   --  6.9  --   --   HGB 7.8*  < > 7.6*  7.6*  < > 10.4* 9.2* 9.6* 10.5* 9.0*  HCT 24.8*  < > 24.0*  23.8*  < > 33.1* 29* 29.6* 31.0* 27.5*  MCV 82.7  --  82.8  82.6  < > 82.8  --  80.2  --  80.4  PLT 252  --  201  206  < > 229 322 301  --  242  < > = values in this interval not displayed. Lipid Panel:  Recent Labs  02/29/16 1127  HDL 55   Cardiac Enzymes:  Recent Labs  06/24/15 1211 06/24/15 1747 02/29/16 1127    TROPONINI 0.30* 0.05* <0.03   CBG:  Recent Labs  04/25/16 0402 04/25/16 0843 04/25/16 1109  GLUCAP 140* 168* 143*      Dg Abd 1 View  Result Date: 04/02/2016 CLINICAL DATA:  Nausea and vomiting starting yesterday EXAM: ABDOMEN - 1 VIEW COMPARISON:  04/01/2016 FINDINGS: Mild gaseous distended small bowel loops are noted in left mid abdomen suspicious for ileus, enteritis or early bowel obstruction. Some colonic gas noted in right colon and transverse colon. Colonic gas noted within rectum. IMPRESSION: Mild gaseous distended small bowel loops in left mid abdomen suspicious for mild ileus enteritis or less likely small bowel obstruction. Electronically Signed   By: Lahoma Crocker M.D.   On: 04/02/2016 10:36   Ct Head Wo Contrast  Result Date: 04/01/2016 CLINICAL DATA:  Acute onset of altered mental status. Initial encounter. EXAM: CT HEAD WITHOUT CONTRAST TECHNIQUE: Contiguous axial images were obtained from the base of the skull through the vertex without intravenous contrast. COMPARISON:  CT of the head performed 03/27/2016, and MRI of the brain performed 02/29/2016 FINDINGS: Brain: No evidence of acute infarction, hemorrhage, hydrocephalus, extra-axial collection or mass lesion/mass effect. Prominence of the ventricles and sulci reflects mild cortical volume loss. Mild cerebellar atrophy is noted. The brainstem and fourth ventricle are within normal limits. The basal ganglia are unremarkable in appearance. The cerebral hemispheres demonstrate grossly normal gray-white differentiation. No mass effect or midline shift is seen. Vascular: No hyperdense vessel or unexpected calcification. Skull: There is no evidence of fracture; visualized osseous structures are unremarkable in appearance. A mildly prominent empty sella is noted. Sinuses/Orbits: The orbits are within normal limits. The paranasal sinuses and mastoid air cells are well-aerated. Other: No significant soft tissue abnormalities are seen.  Prominent cerumen is seen at the external auditory canals bilaterally, larger on the right. IMPRESSION: 1. No acute intracranial pathology seen on CT. 2. Mild cortical volume loss noted. 3. Prominent cerumen at the external auditory canals bilaterally. Electronically Signed   By: Garald Balding M.D.   On: 04/01/2016 21:53   Ct Abdomen Pelvis W Contrast  Result Date: 04/02/2016 CLINICAL DATA:  Pneumonia, altered mental status.  Abdominal pain. EXAM: CT ABDOMEN AND PELVIS WITH CONTRAST TECHNIQUE: Multidetector CT imaging of the abdomen and pelvis was performed using the standard protocol following bolus administration of intravenous contrast. CONTRAST:  100 cc Isovue 300 IV COMPARISON:  Same day KUB, CT from 03/28/2016 FINDINGS: Lower chest: Small pleural effusions  bilaterally slightly increased since prior exam with adjacent atelectasis. Borderline cardiomegaly. No pericardial effusion. Hepatobiliary: Cholecystectomy. No focal liver lesion or biliary dilatation. Pancreas: Fatty atrophy of the pancreas without focal mass or ductal dilatation. No surrounding peripancreatic inflammation. Spleen: Normal size without focal abnormality. Adrenals/Urinary Tract: Normal bilateral adrenal glands and kidneys without obstructive uropathy. Physiologically distended appearance of the bladder. Stomach/Bowel: Contracted stomach. Normal small bowel rotation. Mild jejunal thickening without significant fluid-filled distention. Enteric contrast passes into large bowel. No evidence acute appendicitis. Vascular/Lymphatic: Aortic atherosclerosis. No enlarged abdominal or pelvic lymph nodes. Reproductive: Status post hysterectomy. No adnexal masses. Other: Small fat containing umbilical hernia. No free air nor free fluid. Left lower quadrant iatrogenic subcutaneous emphysema likely related injection subcutaneous medication. Musculoskeletal: Degenerate changes of the dorsal spine. No acute nor suspicious osseous lesions. IMPRESSION: 1.  Moderate diffuse thickening of jejunum since prior exam suspicious for enteritis. No mechanical bowel obstruction or pneumatosis. 2. Slight increase in small bilateral pleural effusions. Bibasilar atelectasis without pneumonic consolidations. 3. Small fat containing umbilical hernia. Electronically Signed   By: Ashley Royalty M.D.   On: 04/02/2016 22:51   Dg Abd Portable 1v  Result Date: 04/01/2016 CLINICAL DATA:  Vomiting episodes.  Abdominal pain . EXAM: PORTABLE ABDOMEN - 1 VIEW COMPARISON:  CT 03/28/2016. FINDINGS: Soft tissue structures are unremarkable. Surgical clips right upper quadrant. No bowel distention. No free air. Calcifications in pelvis consistent phleboliths. Degenerative changes lumbar spine and both hips. IMPRESSION: No acute or focal abnormality. Electronically Signed   By: Marcello Moores  Register   On: 04/01/2016 12:18   Ct Head Code Stroke W/o Cm  Result Date: 04/23/2016 CLINICAL DATA:  Code stroke. Acute onset of slurred speech and weakness. EXAM: CT HEAD WITHOUT CONTRAST TECHNIQUE: Contiguous axial images were obtained from the base of the skull through the vertex without intravenous contrast. COMPARISON:  CT head without contrast 04/01/2016 FINDINGS: Brain: No acute infarct, hemorrhage, or mass lesion is present. The ventricles are of normal size. No significant extraaxial fluid collection is present. No significant white matter disease is present. The brainstem and cerebellum are within normal limits. Vascular: No hyperdense vessel or unexpected calcification. Skull: The calvarium is within normal limits. No focal lytic or blastic lesions are present. Sinuses/Orbits: The paranasal sinuses are clear. Debris in the external auditory canals is again noted. Go ASPECTS [facility] Stroke Program Early CT Score) - Ganglionic level infarction (caudate, lentiform nuclei, internal capsule, insula, M1-M3 cortex): 7/7 - Supraganglionic infarction (M4-M6 cortex): 3/3 Total score (0-10 with 10 being normal):  10/10 IMPRESSION: 1. Normal CT appearance of the brain. No acute or focal lesion to explain abnormal speech or weakness. 2. ASPECTS is 10/10 Electronically Signed   By: San Morelle M.D.   On: 04/23/2016 14:20    ASSESSMENT/PLAN:  Physical deconditioning - for rehabilitation, PT and OT, for therapeutic strengthening exercises; fall precautions  Toxic encephalopathy - thought to be due to polypharmacy, CT head negative; narcotic medications were discontinued  Acute kidney injury - creatinine 1.22; check BMP  Hyponatremia - NA 134, was given IV fluids, will monitor   Hypertension - continue Lasix 40 mg 1 tab by mouth daily, carvedilol 3.125 mg 1 tab by mouth twice a day  Anxiety - mood this is stable; change Ativan to 0.5 mg give 1/2 tab = 0.125 mg by mouth every morning when necessary and 0.5 mg 1 tab by mouth daily at bedtime when necessary  Schizophrenia - continue Geodon 20 mg 1 capsule by mouth BID, Effexor  XR 150 mg 1 by mouth twice a day,  psych consult  Chronic pain - continue Lyrica 200 mg by mouth daily; physiatry consult  Hyperlipidemia - continue Crestor 20 mg 1 tab by mouth daily  Chronic constipation - continue Colace 100 mg 1 capsule by mouth daily at bedtime, Linzess 290 g by mouth daily  Asthma - no SOB; continue Pro Air 90 g inhaler inhale 2 puffs into lungs every 4 hours when necessary, albuterol 2.5 mg/3 mL inhale into lungs via nebulization every 6 hours when necessary  GERD - continue Nexium 40 mg 1 capsule by mouth daily  Hypothyroidism - continue Synthroid 150 g 1 tab by mouth daily Lab Results  Component Value Date   TSH 10.93 (A) 04/10/2016   Diabetes mellitus, type II - continue NovoLog sliding scale subcutaneous 3 times a day with meals and Levemir 100 units/ml 10 units SQ Q HS; CBG 3 times a day Lab Results  Component Value Date   HGBA1C 6.1 (H) 02/16/2016   OSA - continue CPAP at at bedtime  Bilateral lower extremity edema - continue  Lasix 40 mg 1 tab by mouth daily  Anemia -  hgb 9.0; check CBC     Goals of care:  Short-term rehabilitation    Javarius Tsosie C. Mondovi - NP    [employer] [phone]

## 2016-04-30 DIAGNOSIS — M545 Low back pain: Secondary | ICD-10-CM | POA: Diagnosis not present

## 2016-04-30 DIAGNOSIS — M25561 Pain in right knee: Secondary | ICD-10-CM | POA: Diagnosis not present

## 2016-04-30 DIAGNOSIS — G8929 Other chronic pain: Secondary | ICD-10-CM | POA: Diagnosis not present

## 2016-04-30 DIAGNOSIS — M25562 Pain in left knee: Secondary | ICD-10-CM | POA: Diagnosis not present

## 2016-04-30 DIAGNOSIS — M25512 Pain in left shoulder: Secondary | ICD-10-CM | POA: Diagnosis not present

## 2016-05-01 DIAGNOSIS — M25562 Pain in left knee: Secondary | ICD-10-CM | POA: Diagnosis not present

## 2016-05-01 DIAGNOSIS — M25512 Pain in left shoulder: Secondary | ICD-10-CM | POA: Diagnosis not present

## 2016-05-01 DIAGNOSIS — M545 Low back pain: Secondary | ICD-10-CM | POA: Diagnosis not present

## 2016-05-01 DIAGNOSIS — G8929 Other chronic pain: Secondary | ICD-10-CM | POA: Diagnosis not present

## 2016-05-01 DIAGNOSIS — M25561 Pain in right knee: Secondary | ICD-10-CM | POA: Diagnosis not present

## 2016-05-01 LAB — BASIC METABOLIC PANEL
BUN: 13 mg/dL (ref 4–21)
Creatinine: 0.9 mg/dL (ref 0.5–1.1)
Glucose: 151 mg/dL
Potassium: 4 mmol/L (ref 3.4–5.3)
Sodium: 141 mmol/L (ref 137–147)

## 2016-05-01 LAB — CBC AND DIFFERENTIAL
HCT: 34 % — AB (ref 36–46)
HEMOGLOBIN: 11 g/dL — AB (ref 12.0–16.0)
PLATELETS: 258 10*3/uL (ref 150–399)
WBC: 11.7 10*3/mL

## 2016-05-05 DIAGNOSIS — G8929 Other chronic pain: Secondary | ICD-10-CM | POA: Diagnosis not present

## 2016-05-05 DIAGNOSIS — M545 Low back pain: Secondary | ICD-10-CM | POA: Diagnosis not present

## 2016-05-05 DIAGNOSIS — M25562 Pain in left knee: Secondary | ICD-10-CM | POA: Diagnosis not present

## 2016-05-05 DIAGNOSIS — M25561 Pain in right knee: Secondary | ICD-10-CM | POA: Diagnosis not present

## 2016-05-05 DIAGNOSIS — M25512 Pain in left shoulder: Secondary | ICD-10-CM | POA: Diagnosis not present

## 2016-05-06 DIAGNOSIS — M25512 Pain in left shoulder: Secondary | ICD-10-CM | POA: Diagnosis not present

## 2016-05-06 DIAGNOSIS — M545 Low back pain: Secondary | ICD-10-CM | POA: Diagnosis not present

## 2016-05-06 DIAGNOSIS — G8929 Other chronic pain: Secondary | ICD-10-CM | POA: Diagnosis not present

## 2016-05-06 DIAGNOSIS — M25562 Pain in left knee: Secondary | ICD-10-CM | POA: Diagnosis not present

## 2016-05-06 DIAGNOSIS — M25561 Pain in right knee: Secondary | ICD-10-CM | POA: Diagnosis not present

## 2016-05-08 ENCOUNTER — Encounter: Payer: Self-pay | Admitting: Adult Health

## 2016-05-08 ENCOUNTER — Non-Acute Institutional Stay (SKILLED_NURSING_FACILITY): Payer: Medicare Other | Admitting: Adult Health

## 2016-05-08 DIAGNOSIS — F200 Paranoid schizophrenia: Secondary | ICD-10-CM | POA: Diagnosis not present

## 2016-05-08 DIAGNOSIS — E038 Other specified hypothyroidism: Secondary | ICD-10-CM

## 2016-05-08 DIAGNOSIS — R5381 Other malaise: Secondary | ICD-10-CM | POA: Diagnosis not present

## 2016-05-08 DIAGNOSIS — M25561 Pain in right knee: Secondary | ICD-10-CM | POA: Diagnosis not present

## 2016-05-08 DIAGNOSIS — F419 Anxiety disorder, unspecified: Secondary | ICD-10-CM

## 2016-05-08 DIAGNOSIS — D649 Anemia, unspecified: Secondary | ICD-10-CM

## 2016-05-08 DIAGNOSIS — J45909 Unspecified asthma, uncomplicated: Secondary | ICD-10-CM

## 2016-05-08 DIAGNOSIS — E118 Type 2 diabetes mellitus with unspecified complications: Secondary | ICD-10-CM | POA: Diagnosis not present

## 2016-05-08 DIAGNOSIS — G4733 Obstructive sleep apnea (adult) (pediatric): Secondary | ICD-10-CM | POA: Diagnosis not present

## 2016-05-08 DIAGNOSIS — I10 Essential (primary) hypertension: Secondary | ICD-10-CM | POA: Diagnosis not present

## 2016-05-08 DIAGNOSIS — Z794 Long term (current) use of insulin: Secondary | ICD-10-CM

## 2016-05-08 DIAGNOSIS — G894 Chronic pain syndrome: Secondary | ICD-10-CM

## 2016-05-08 DIAGNOSIS — R600 Localized edema: Secondary | ICD-10-CM

## 2016-05-08 DIAGNOSIS — K5909 Other constipation: Secondary | ICD-10-CM

## 2016-05-08 DIAGNOSIS — M545 Low back pain: Secondary | ICD-10-CM | POA: Diagnosis not present

## 2016-05-08 DIAGNOSIS — G8929 Other chronic pain: Secondary | ICD-10-CM | POA: Diagnosis not present

## 2016-05-08 DIAGNOSIS — K219 Gastro-esophageal reflux disease without esophagitis: Secondary | ICD-10-CM

## 2016-05-08 DIAGNOSIS — E785 Hyperlipidemia, unspecified: Secondary | ICD-10-CM | POA: Diagnosis not present

## 2016-05-08 DIAGNOSIS — M25512 Pain in left shoulder: Secondary | ICD-10-CM | POA: Diagnosis not present

## 2016-05-08 DIAGNOSIS — M25562 Pain in left knee: Secondary | ICD-10-CM | POA: Diagnosis not present

## 2016-05-08 NOTE — Progress Notes (Addendum)
Patient ID: [name], female   DOB: [birth year], 61 y.o.   MRN: [id number]    DATE:    05/08/16  MRN:  [id number]  BIRTHDAY: 01/01/1956  Facility:  Nursing Home Location:  [place] Room Number: 2951-O  LEVEL OF CARE:  SNF (31)  Contact Information    Name Relation Home Work Clarinda Son   [phone]   Mann,Christiane Daughter [phone]  [phone]       Code Status History    Date Active Date Inactive Code Status Order ID Comments User Context   04/23/2016  7:44 PM 04/25/2016  7:35 PM Full Code [id number]  Jonetta Osgood, MD ED   03/28/2016 12:17 AM 04/07/2016  7:19 PM Full Code [id number]  Karmen Bongo, MD ED   02/29/2016  5:07 PM 03/03/2016  8:15 PM Partial Code [id number]  Caren Griffins, MD ED   02/13/2016 11:49 PM 02/17/2016  9:42 PM Full Code [id number]  Lily Kocher, MD Inpatient   06/23/2015 12:10 PM 06/27/2015 10:52 PM Full Code [id number]  Rondel Jumbo, PA-C ED   11/12/2014 10:00 PM 11/15/2014  4:54 PM Full Code [id number]  Ivor Costa, MD ED   10/28/2014  5:02 AM 10/30/2014  9:46 PM Full Code [id number]  Theressa Millard, MD ED   08/14/2014  7:09 AM 08/16/2014  3:50 PM Full Code [id number]  Lavina Hamman, MD ED   01/06/2014  4:34 PM 01/07/2014  3:05 PM DNR [id number]  Domenic Polite, MD Inpatient   08/08/2013  7:43 PM 08/12/2013  8:44 PM Full Code [id number]  Rigoberto Noel, MD ED   06/04/2011  3:36 AM 06/05/2011  7:23 PM Full Code [id number]  Lutricia Feil, RN Inpatient       Chief Complaint  Patient presents with  . Discharge Note    HISTORY OF PRESENT ILLNESS:  This is a 61-YO female who is for discharge home with Home health PT, OT, CNA and Nursing.  She was readmitted to [facility] on 04/25/2016 for short-term rehabilitation following an admission at [facility] 04/24/1026-04/25/2016 for confusion and lethargy, diagnosed to be acute toxic encephalopathy probably due to polypharmacy. CT head negative. Narcotic  medications were discontinued.  Of note, she was recently discharged from [facility] after a short-term rehabilitation post hospitalization 03/27/2016-04/07/2016 for AMS secondary to polypharmacy, PNA, and collitis.   Patient was admitted to this facility for short-term rehabilitation after the patient's recent hospitalization.  Patient has completed SNF rehabilitation and therapy has cleared the patient for discharge.    PAST MEDICAL HISTORY:  Past Medical History:  Diagnosis Date  . Anemia   . Anxiety   . Arthritis    "pretty much all my joints"  . Asthma   . Benign paroxysmal positional vertigo 12/30/2012  . Breast cancer ([place]) 02/13/12   ruq  100'clock bx Ductal Carcinoma in Situ,(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  . Breast mass in female    L breast 2008, US showed likely fat necrosis  . Chronic lower back pain   . CKD (chronic kidney disease), stage III    "lower stage" (01/06/2014)  . Daily headache   . Depression   . GERD (gastroesophageal reflux disease)   . History of blood transfusion 2011   "after one of my OR's"  . History of hiatal hernia   . History of stomach ulcers   . HSV (herpes simplex virus) infection   . Hx of radiation therapy  05/05/12- 07/15/12   right breast, 45 gray x 25 fx, lumpectomy cavity boosted to 16.2 gray  . Hyperlipemia   . Hypertension    sees Dr. Criss Rosales , [facility]  . Hypothyroidism   . Kidney stones   . Knee pain, bilateral   . Migraines    "~ 3 times/month" (01/06/2014)  . Obesity   . OSA on CPAP    pt does not know settings  . Pancreatitis    hx of  . Pneumonia 1950's  . Polyneuropathy in diabetes(357.2)   . Schizophrenia ([place])   . 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 Tachycardia    with sx in 2008  . Thyroid disease   . Type II diabetes mellitus ([facility])      CURRENT MEDICATIONS: Reviewed  Patient's Medications  New Prescriptions   No medications on file  Previous Medications   ACETAMINOPHEN (TYLENOL) 325 MG TABLET    Take  650 mg by mouth every 8 (eight) hours.   ACETAMINOPHEN (TYLENOL) 650 MG CR TABLET    Take 650 mg by mouth every 4 (four) hours. Per SO   ALBUTEROL (PROAIR HFA) 108 (90 BASE) MCG/ACT INHALER    Inhale 2 puffs into the lungs every 4 (four) hours as needed for wheezing or shortness of breath.    ALBUTEROL (PROVENTIL) (2.5 MG/3ML) 0.083% NEBULIZER SOLUTION    Inhale 3 mLs into the lungs every 6 (six) hours as needed. For shortness of breath   ASPIRIN EC 81 MG TABLET    Take 1 tablet (81 mg total) by mouth daily.   B COMPLEX-C (B-COMPLEX WITH VITAMIN C) TABLET    Take 1 tablet by mouth daily.   CARVEDILOL (COREG) 3.125 MG TABLET    Take 1 tablet (3.125 mg total) by mouth 2 (two) times daily with a meal.   CHOLECALCIFEROL (VITAMIN D-3) 1000 UNITS CAPS    Take 1,000 Units by mouth daily.   DOCUSATE SODIUM (COLACE) 100 MG CAPSULE    Take 100 mg by mouth at bedtime.   ESOMEPRAZOLE (NEXIUM) 40 MG CAPSULE    Take 1 capsule (40 mg total) by mouth daily before breakfast.   FUROSEMIDE (LASIX) 40 MG TABLET    Take 1 tablet (40 mg total) by mouth daily.   INSULIN ASPART (NOVOLOG) 100 UNIT/ML INJECTION    Inject 0-9 Units into the skin 3 (three) times daily with meals. 0-9 Units, Subcutaneous CBG < 70: implement hypoglycemia protocol CBG 70 - 120: 0 units CBG 121 - 150: 1 unit CBG 151 - 200: 2 units CBG 201 - 250: 3 units CBG 251 - 300: 5 units CBG 301 - 350: 7 units CBG 351 - 400: 9 units CBG > 400: call MD   INSULIN DETEMIR (LEVEMIR FLEXTOUCH) 100 UNIT/ML PEN    Inject 10 Units into the skin at bedtime.    LEVOTHYROXINE (SYNTHROID, LEVOTHROID) 150 MCG TABLET    Take 150 mcg by mouth daily before breakfast.   LINACLOTIDE (LINZESS) 290 MCG CAPS CAPSULE    Take 290 mcg by mouth daily before breakfast.   LORAZEPAM (ATIVAN) 0.5 MG TABLET    Take 0.25 mg by mouth every morning.   LORAZEPAM (ATIVAN) 0.5 MG TABLET    Take 0.5 mg by mouth at bedtime as needed for anxiety.   MULTIPLE VITAMIN (MULTIVITAMIN WITH  MINERALS) TABS TABLET    Take 1 tablet by mouth daily.   PREGABALIN (LYRICA) 200 MG CAPSULE    Take 1 capsule (200 mg total) by  mouth daily.   ROSUVASTATIN (CRESTOR) 20 MG TABLET    Take 20 mg by mouth daily.   VENLAFAXINE XR (EFFEXOR-XR) 150 MG 24 HR CAPSULE    Take 150 mg by mouth 2 (two) times daily.   ZIPRASIDONE (GEODON) 20 MG CAPSULE    Take 1 capsule (20 mg total) by mouth 2 (two) times daily with a meal.  Modified Medications   No medications on file  Discontinued Medications   No medications on file     Allergies  Allergen Reactions  . Bee Venom Anaphylaxis  . Shellfish Allergy Anaphylaxis  . Buprenorphine Hcl Other (See Comments)    Overly sedated     REVIEW OF SYSTEMS:  GENERAL: no change in appetite, no fatigue, no weight changes, no fever, chills or weakness EYES: Denies change in vision, dry eyes, eye pain, itching or discharge EARS: Denies change in hearing, ringing in ears, or earache NOSE: Denies nasal congestion or epistaxis MOUTH and THROAT: Denies oral discomfort, gingival pain or bleeding, pain from teeth or hoarseness   RESPIRATORY: no cough, SOB, DOE, wheezing, hemoptysis CARDIAC: no chest pain, edema or palpitations GI: no abdominal pain, diarrhea, constipation, heart burn, nausea or vomiting GU: Denies dysuria, frequency, hematuria, incontinence, or discharge PSYCHIATRIC: Denies feeling of depression or anxiety. No report of hallucinations, insomnia, paranoia, or agitation    PHYSICAL EXAMINATION  GENERAL APPEARANCE: Well nourished. In no acute distress. Morbidly obese SKIN:  Skin is warm and dry.  HEAD: Normal in size and contour. No evidence of trauma EYES: Lids open and close normally. No blepharitis, entropion or ectropion. PERRL. Conjunctivae are clear and sclerae are white. Lenses are without opacity EARS: Pinnae are normal. Patient hears normal voice tunes of the examiner MOUTH and THROAT: Lips are without lesions. Oral mucosa is moist and  without lesions. Tongue is normal in shape, size, and color and without lesions NECK: supple, trachea midline, no neck masses, no thyroid tenderness, no thyromegaly LYMPHATICS: no LAN in the neck, no supraclavicular LAN RESPIRATORY: breathing is even & unlabored, BS CTAB CARDIAC: RRR, no murmur,no extra heart sounds, no edema GI: abdomen soft, normal BS, no masses, no tenderness, no hepatomegaly, no splenomegaly EXTREMITIES:  Able to move 4 extremities PSYCHIATRIC: Alert and oriented X 3. Affect and behavior are appropriate   LABS/RADIOLOGY: Labs reviewed: Basic Metabolic Panel:  Recent Labs  03/30/16 1113  04/01/16 0432 04/02/16 1639  04/23/16 1429 04/23/16 1450 04/24/16 0456 04/25/16 0222 05/01/16  NA 145  --  145  --   < > 124* 124* 129* 134* 141  K 4.0  --  3.6  --   < > 3.6 3.7 3.4* 3.7 4.0  CL 112*  --  113*  --   < > 89* 89* 94* 102  --   CO2 24  --  23  --   < > 22  --  24 20*  --   GLUCOSE 205*  --  196*  --   < > 113* 112* 103* 189*  --   BUN 23*  --  32*  --   < > 28* 27* 24* 17 13  CREATININE 1.12*  < > 1.09*  --   < > 2.02* 2.10* 1.82* 1.22* 0.9  CALCIUM 9.2  --  8.5*  --   < > 8.7*  --  8.8* 8.8*  --   MG 1.6*  --  2.0 2.0  --   --   --   --   --   --   < > =  values in this interval not displayed. Liver Function Tests:  Recent Labs  04/01/16 0432 04/03/16 0517 04/23/16 1429  AST _0 ALT _1 ALKPHOS 59 53 60  BILITOT 0.7 0.9 0.6  PROT 6.1* 5.5* 6.1*  ALBUMIN 2.9* 3.0* 3.3*    Recent Labs  06/23/15 0754 11/05/15 0255 02/13/16 1905  LIPASE _2 CBC:  Recent Labs  03/27/16 1850  03/28/16 0917  04/01/16 0432  04/23/16 1429 04/23/16 1450 04/24/16 0456 05/01/16  WBC 14.8*  --  10.2  10.3  < > 15.3*  < > 10.9*  --  7.9 11.7  NEUTROABS 12.2*  --  8.3*  --   --   --  6.9  --   --   --   HGB 7.8*  < > 7.6*  7.6*  < > 10.4*  < > 9.6* 10.5* 9.0* 11.0*  HCT 24.8*  < > 24.0*  23.8*  < > 33.1*  < > 29.6* 31.0* 27.5* 34*  MCV  82.7  --  82.8  82.6  < > 82.8  --  80.2  --  80.4  --   PLT 252  --  201  206  < > 229  < > 301  --  242 258  < > = values in this interval not displayed. Lipid Panel:  Recent Labs  02/29/16 1127  HDL 55   Cardiac Enzymes:  Recent Labs  06/24/15 1211 06/24/15 1747 02/29/16 1127  TROPONINI 0.30* 0.05* <0.03   CBG:  Recent Labs  04/25/16 0402 04/25/16 0843 04/25/16 1109  GLUCAP 140* 168* 143*      Ct Head Code Stroke W/o Cm  Result Date: 04/23/2016 CLINICAL DATA:  Code stroke. Acute onset of slurred speech and weakness. EXAM: CT HEAD WITHOUT CONTRAST TECHNIQUE: Contiguous axial images were obtained from the base of the skull through the vertex without intravenous contrast. COMPARISON:  CT head without contrast 04/01/2016 FINDINGS: Brain: No acute infarct, hemorrhage, or mass lesion is present. The ventricles are of normal size. No significant extraaxial fluid collection is present. No significant white matter disease is present. The brainstem and cerebellum are within normal limits. Vascular: No hyperdense vessel or unexpected calcification. Skull: The calvarium is within normal limits. No focal lytic or blastic lesions are present. Sinuses/Orbits: The paranasal sinuses are clear. Debris in the external auditory canals is again noted. Go ASPECTS [facility] Stroke Program Early CT Score) - Ganglionic level infarction (caudate, lentiform nuclei, internal capsule, insula, M1-M3 cortex): 7/7 - Supraganglionic infarction (M4-M6 cortex): 3/3 Total score (0-10 with 10 being normal): 10/10 IMPRESSION: 1. Normal CT appearance of the brain. No acute or focal lesion to explain abnormal speech or weakness. 2. ASPECTS is 10/10 Electronically Signed   By: San Morelle M.D.   On: 04/23/2016 14:20    ASSESSMENT/PLAN:  Physical deconditioning - for rehabilitation, PT and OT, for therapeutic strengthening exercises; fall precautions  Toxic encephalopathy - thought to be due to  polypharmacy, CT head negative; narcotic medications were discontinued; resolved  Acute kidney injury -  Improved, resolved Lab Results  Component Value Date   CREATININE 0.9 05/01/2016    Hyponatremia - NA 141, resolved  Hypertension - well controlled; continue Lasix 40 mg 1 tab by mouth daily and carvedilol 3.125 mg 1 tab by mouth twice a day  Anxiety - mood this is stable; continue Ativan to 0.5 mg give 1/2 tab = 0.125 mg by mouth every morning when  necessary and 0.5 mg 1 tab by mouth daily at bedtime when necessary  Schizophrenia - continue Geodon 20 mg 1 capsule by mouth BID and Effexor XR 150 mg 1 by mouth twice a day  Chronic pain - continue Lyrica 200 mg by mouth daily and Tylenol 650 mg by mouth every 4 hours when necessary; was recently started by physiatry is on oxycodone 5 mg 1 tab by mouth every 8 hours when necessary  Hyperlipidemia - continue Crestor 20 mg 1 tab by mouth daily  Chronic constipation - continue Colace 100 mg 1 capsule by mouth daily at bedtime, Linzess 290 g by mouth daily  Asthma - no SOB; continue Pro Air 90 g inhaler inhale 2 puffs into lungs every 4 hours when necessary, albuterol 2.5 mg/3 mL inhale into lungs via nebulization every 6 hours when necessary  GERD - continue Nexium 40 mg 1 capsule by mouth daily  Hypothyroidism - continue Synthroid 150 g 1 tab by mouth daily Lab Results  Component Value Date   TSH 10.93 (A) 04/10/2016   Diabetes mellitus, type II - continue NovoLog sliding scale subcutaneous 3 times a day with meals And Levemir 100 units/mL inject 10 units subcutaneous daily at bedtime; CBG 3 times a day Lab Results  Component Value Date   HGBA1C 6.1 (H) 02/16/2016   OSA - continue CPAP at at bedtime  Bilateral lower extremity edema - continue Lasix 40 mg 1 tab by mouth daily  Anemia -  improved Lab Results  Component Value Date   HGB 11.0 (A) 05/01/2016      I have filled out patient's discharge paperwork and written  prescriptions. Narcotic medications were not prescribed. Patient will follow-up with [facility].   Patient will receive home health PT, OT, Nursing and CNA.  DME provided:  None  Total discharge time: Greater than 30 minutes Greater than 50% was spent in counseling and coordination of care with the patient.   Discharge time involved coordination of the discharge process with social worker, nursing staff and therapy department. Medical justification for home health services verified.    Aleria Maheu C. [place] - NP    [employer] [phone]

## 2016-05-09 IMAGING — CR DG CHEST 1V PORT
2 series · 2 of 2 positions shown · non-contrast
Comparison: 10/27/2014

CLINICAL DATA: Acute encephalopathy.

EXAM:
PORTABLE CHEST 1 VIEW

[portable (1 of 2)]
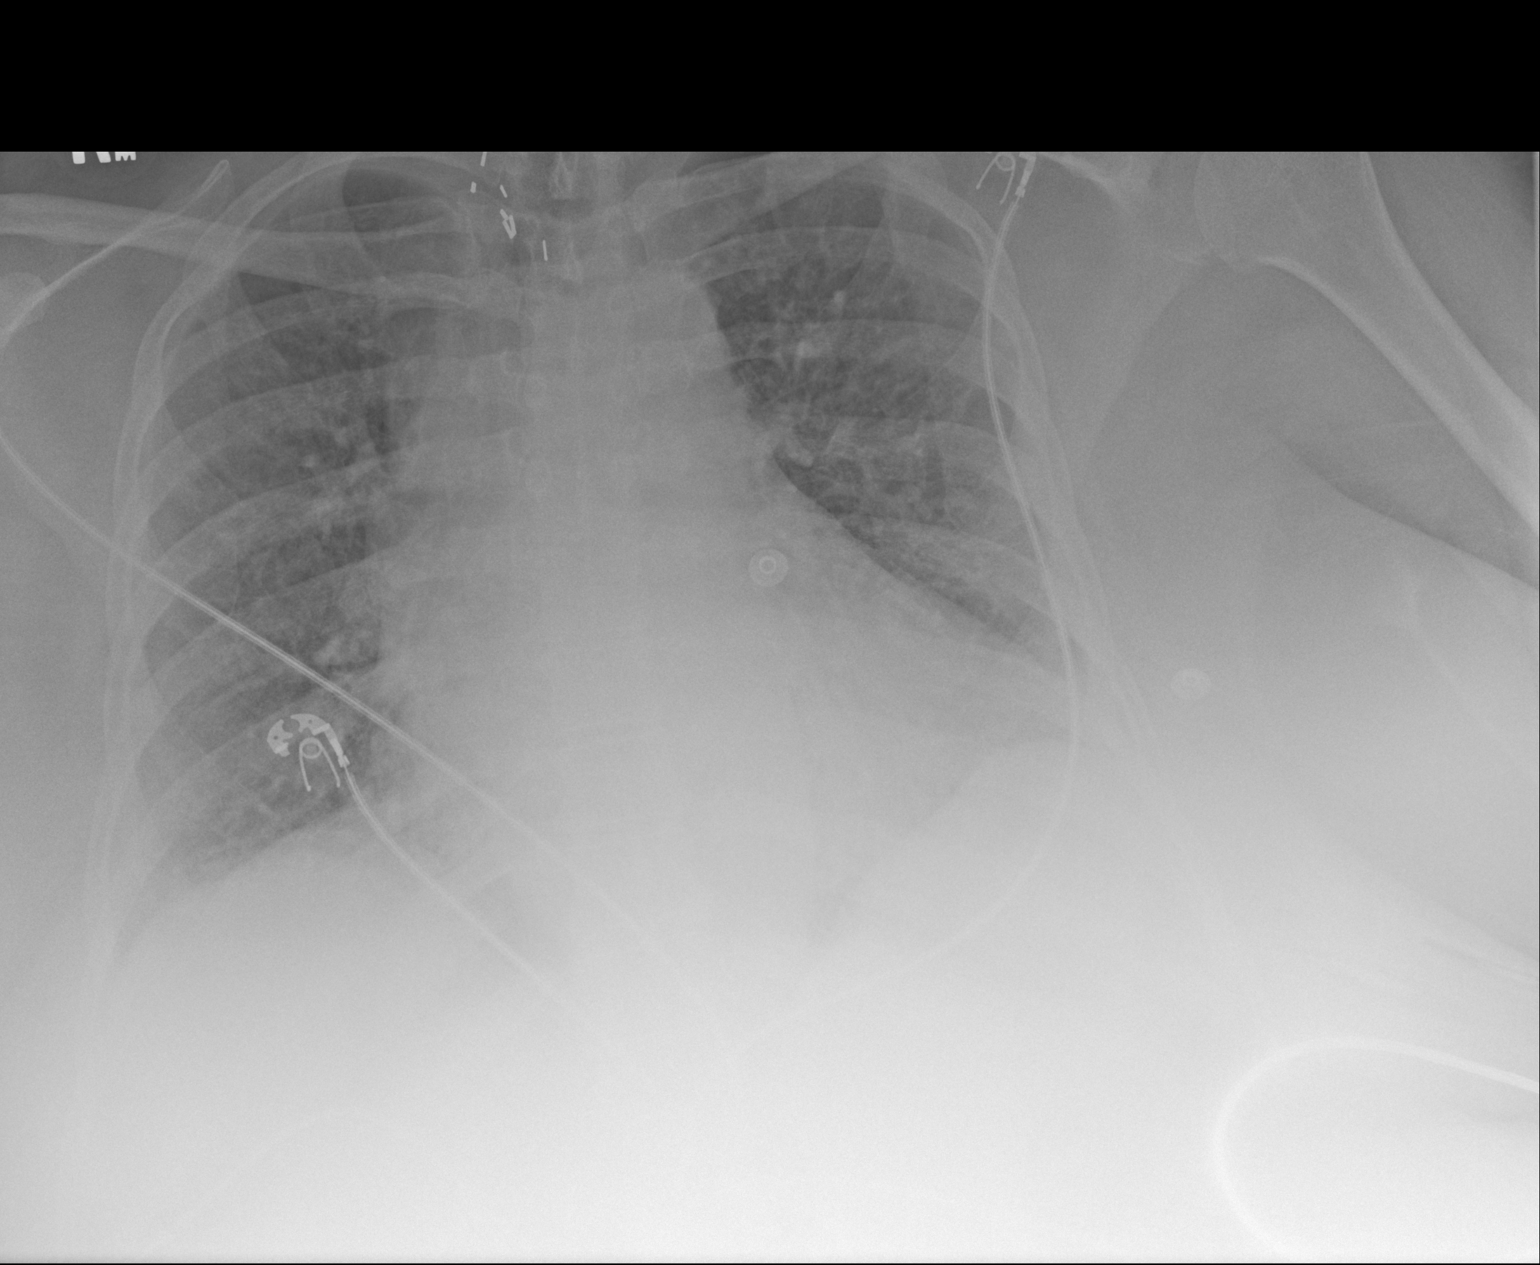

[portable (2 of 2)]
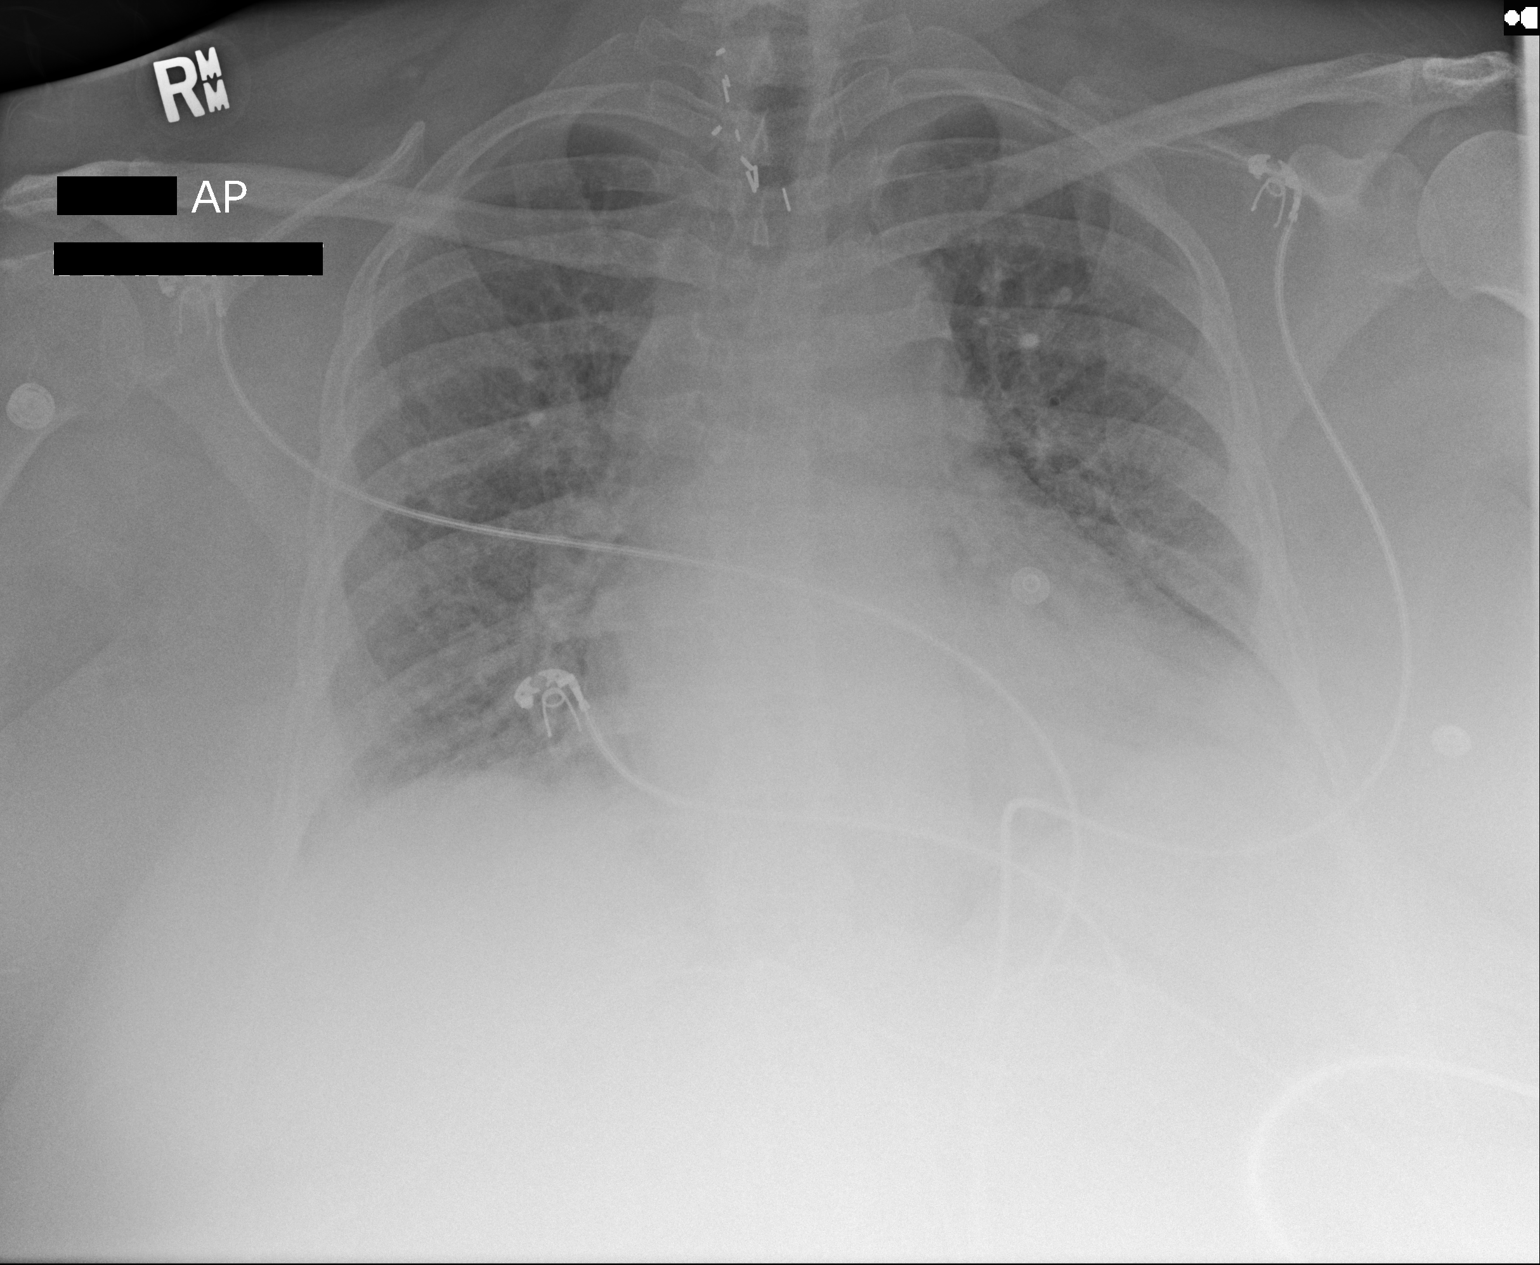

[2 of 2 positions shown; findings below may reference images not displayed]

FINDINGS: Lung volumes are low. Borderline cardiomegaly. Prominence of the
bronchovascular structures, may be related to low lung volumes
versus vascular congestion. No confluent airspace disease, large
pleural effusion or pneumothorax. Surgical clips project over the
thoracic inlet.
IMPRESSION: Hypoventilatory chest. There is borderline cardiomegaly, which may
be accentuated by low lung volumes. Similarly, prominence of
bronchovascular structures, low lung volumes versus vascular
congestion.

## 2016-05-09 IMAGING — MR MR HEAD W/O CM
9 of 11 series · 32 of 48 positions shown · non-contrast
Comparison: Prior CT from earlier the same day as well as previous
MRI from 10/29/2014.

CLINICAL DATA: Initial evaluation for acute slurred speech.

EXAM:
MRI HEAD WITHOUT CONTRAST
TECHNIQUE: Multiplanar, multiecho pulse sequences of the brain and surrounding
structures were obtained without intravenous contrast.

[Series 3: DWI · axial · 3.6mm · 0.94mm/px · z∈[-135,+11]mm · 6 of 84 slices shown (1 of 4)]
[im 1/84]
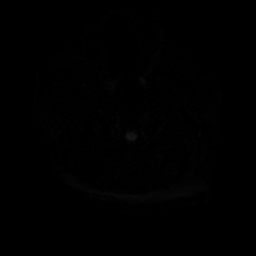
[im 17/84]
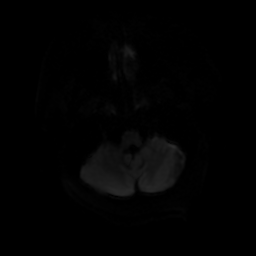
[im 34/84]
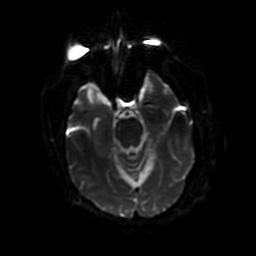
[im 50/84]
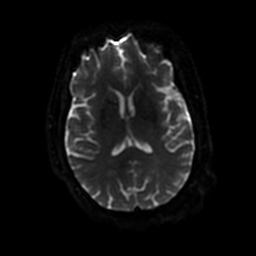
[im 67/84]
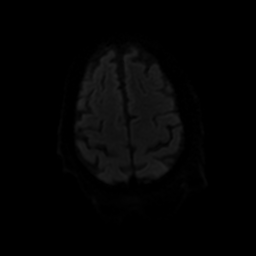
[im 84/84]
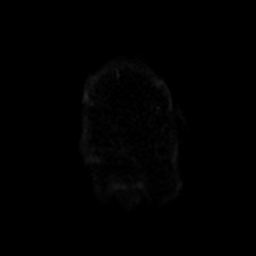

[Series 5: FLAIR · sagittal · 5.0mm · 0.47mm/px · 2 of 23 slices shown (1 of 2)]
[im 1/23]
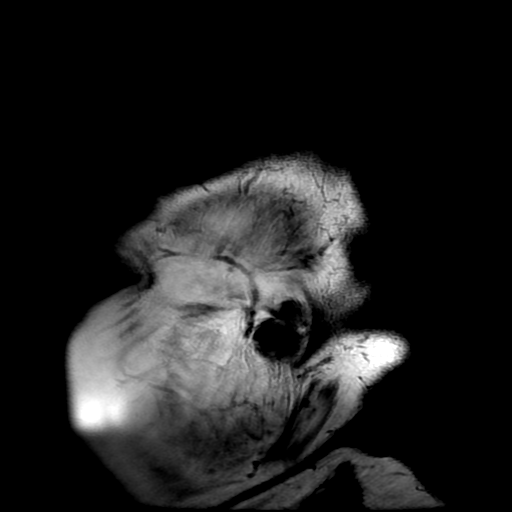
[im 23/23]
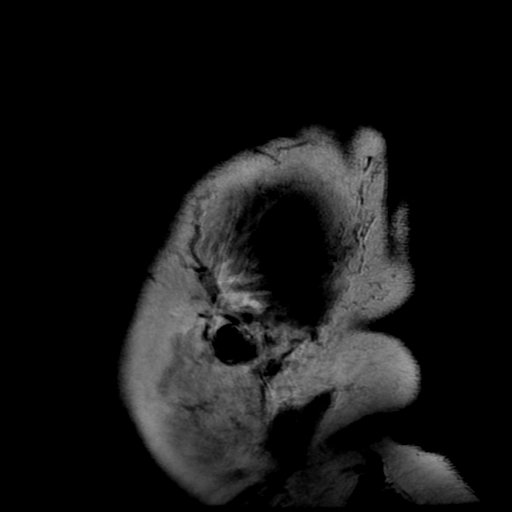

[Series 6: DWI · coronal · 5.0mm · 0.94mm/px · 6 of 72 slices shown (2 of 4)]
[im 1/72]
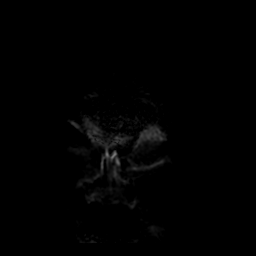
[im 15/72]
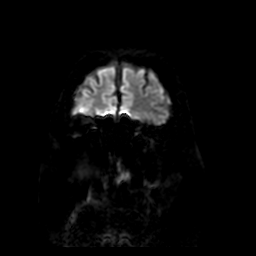
[im 29/72]
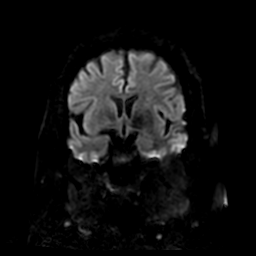
[im 43/72]
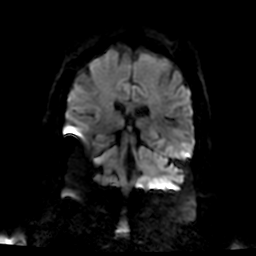
[im 57/72]
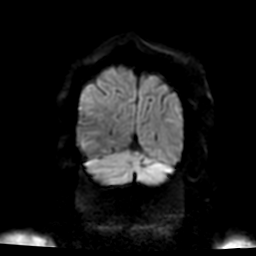
[im 72/72]
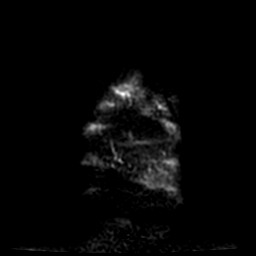

[Series 7: T2 · axial · 5.0mm · 0.47mm/px · z∈[-132,+10]mm · 2 of 25 slices shown (1 of 2)]
[im 1/25]
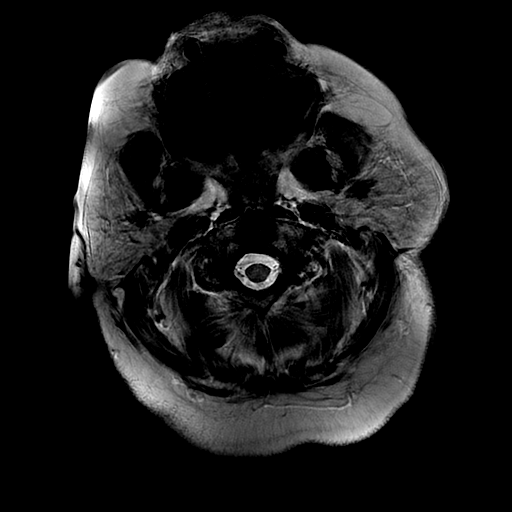
[im 25/25]
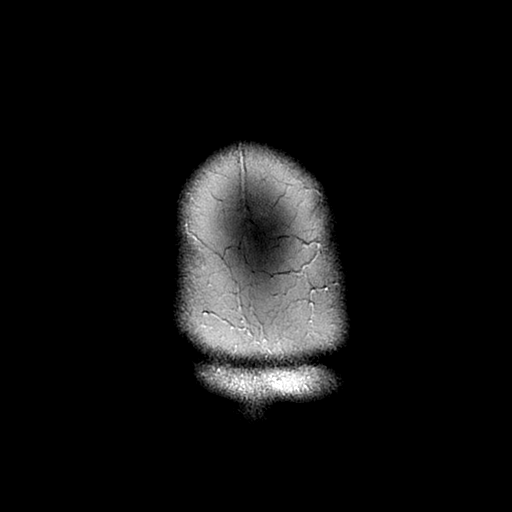

[Series 8: FLAIR · axial · 5.0mm · 0.47mm/px · z∈[-132,+10]mm · 2 of 25 slices shown (2 of 2)]
[im 1/25]
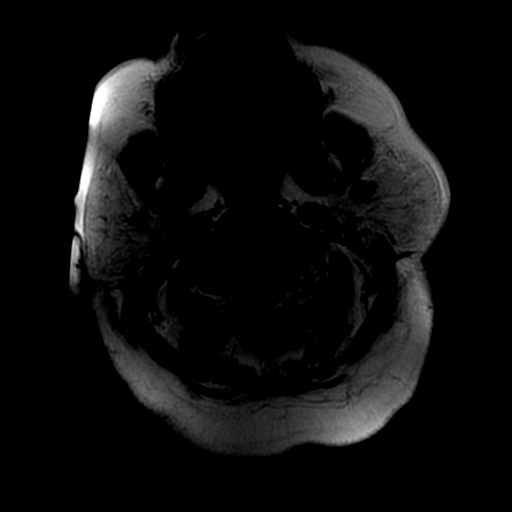
[im 25/25]
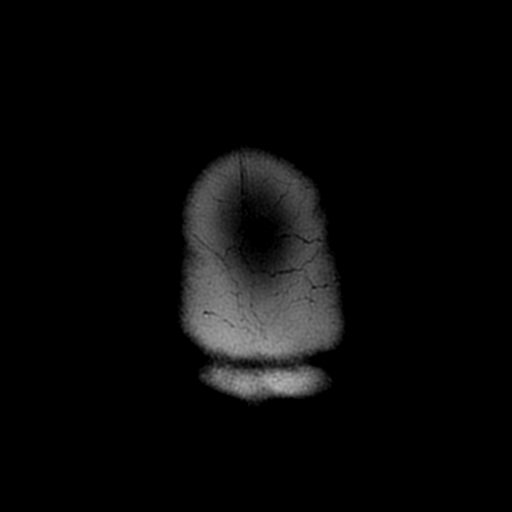

[Series 9: (person_name) · axial · 3.0mm · 0.47mm/px · z∈[-131,-72]mm · 4 of 96 slices shown]
[im 1/96]
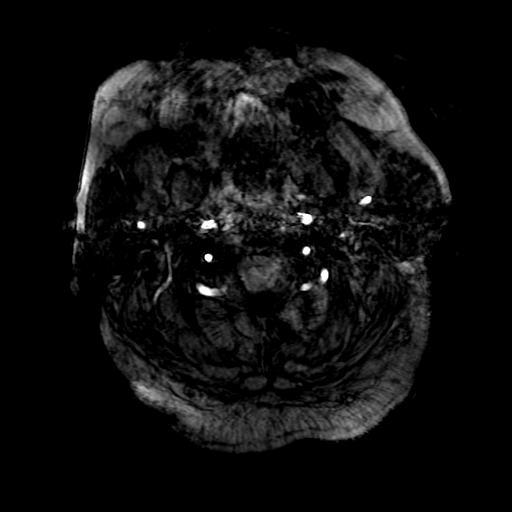
[im 14/96]
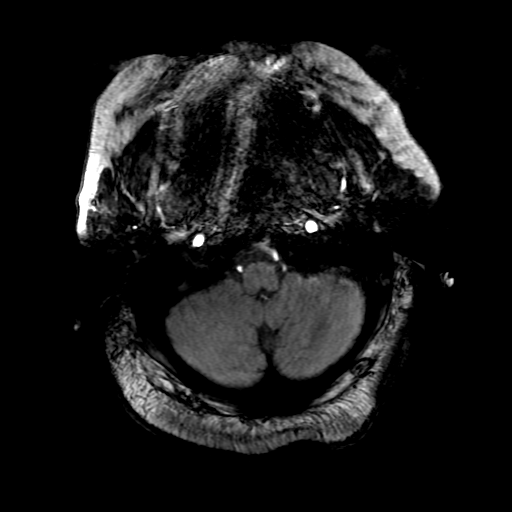
[im 28/96]
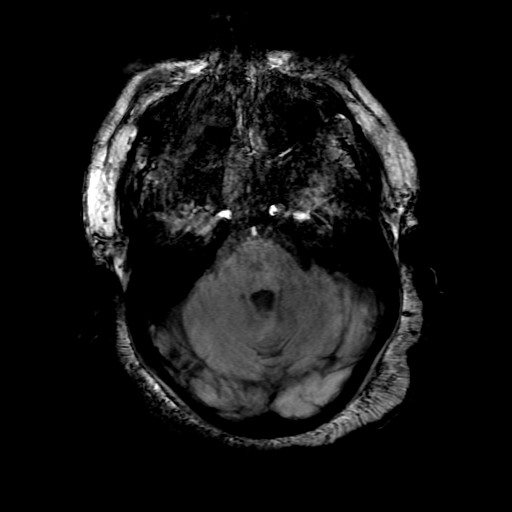
[im 41/96]
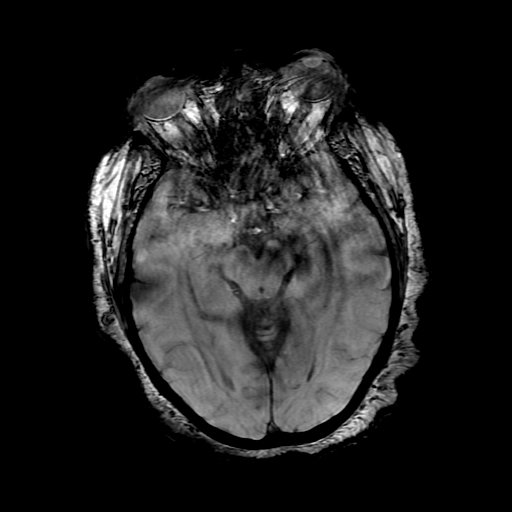

[Series 11: T2 · coronal · 5.0mm · 0.47mm/px · 3 of 30 slices shown (2 of 2)]
[im 1/30]
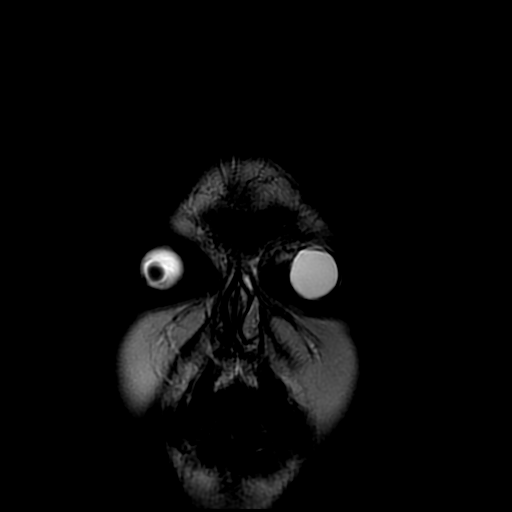
[im 15/30]
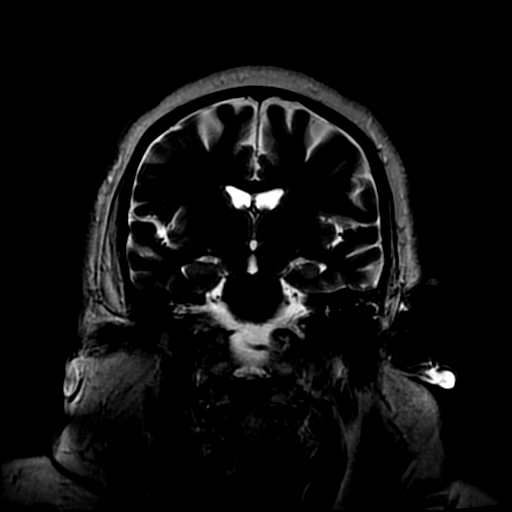
[im 30/30]
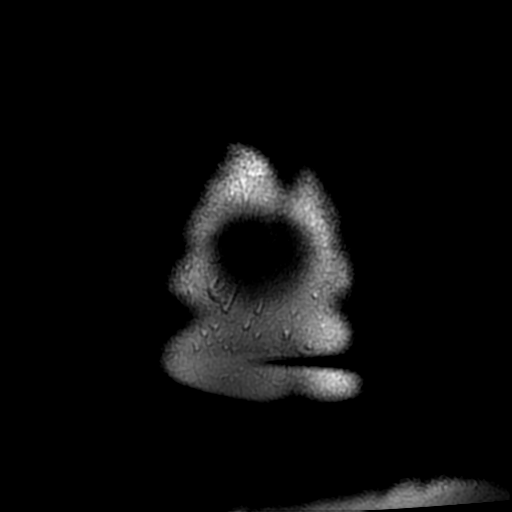

[Series 300: DWI · axial · 3.6mm · 0.94mm/px · z∈[-135,+11]mm · 4 of 42 slices shown (3 of 4)]
[im 1/42]
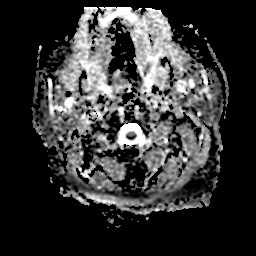
[im 14/42]
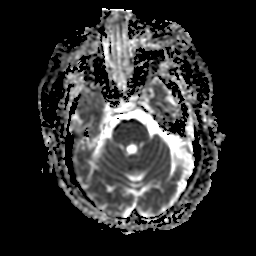
[im 28/42]
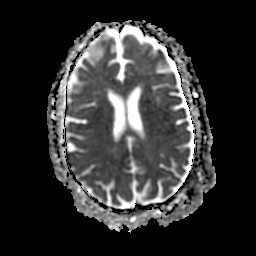
[im 42/42]
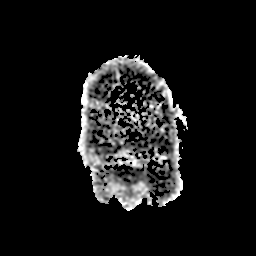

[Series 600: DWI · coronal · 5.0mm · 0.94mm/px · 3 of 35 slices shown (4 of 4)]
[im 1/35]
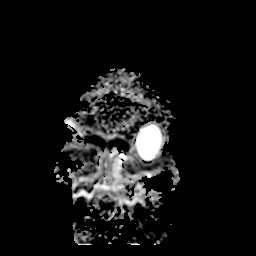
[im 18/35]
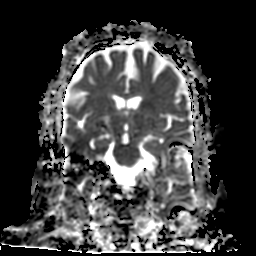
[im 35/35]
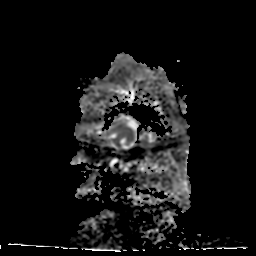

[32 of 48 positions shown; findings below may reference images not displayed]

FINDINGS: The CSF containing spaces are within normal limits for patient age.
No focal parenchymal signal abnormality is identified. No mass
lesion, midline shift, or extra-axial fluid collection. Ventricles
are normal in size without evidence of hydrocephalus.

No diffusion-weighted signal abnormality is identified to suggest
acute intracranial infarct. Gray-white matter differentiation is
maintained. Normal flow voids are seen within the intracranial
vasculature. No intracranial hemorrhage identified.

The cervicomedullary junction is normal. Incidental note made of an
empty sella. Bilateral exophthalmos again noted. No acute
abnormality about the globes and orbits.

The bone marrow signal intensity is normal. Calvarium is intact.
Visualized upper cervical spine is within normal limits.

Scalp soft tissues are unremarkable.

Small amount layering opacity within the left sphenoid sinus.
Paranasal sinuses are otherwise clear. Small left mastoid effusion.
IMPRESSION: 1. No acute intracranial infarct or other process identified.
2. Stable bilateral exophthalmos. No retrobulbar process. Findings
could be related to underlying thyroid disease. Correlation with
laboratory values recommended.
3. Otherwise normal brain MRI.

## 2016-05-09 IMAGING — CT CT HEAD W/O CM
2 series · 16 of 30 positions shown, 18 images · non-contrast
Comparison: Head MRI 10/29/2014 and CT 10/28/2014

CLINICAL DATA: Altered mental status.  Lethargic.

EXAM:
CT HEAD WITHOUT CONTRAST
TECHNIQUE: Contiguous axial images were obtained from the base of the skull
through the vertex without intravenous contrast.

[Series 201: head w/o, idose (1) · axial · non-contrast · 0.45mm/px · z∈[+73,+183]mm · 8 of 30 slices shown, 10 images]
[im 4/30  brain]
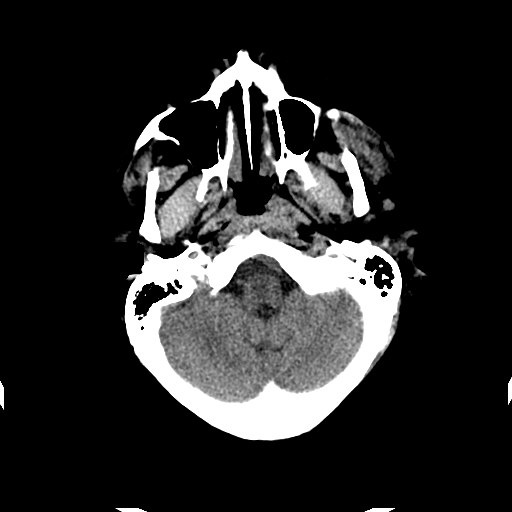
[im 4/30  bone]
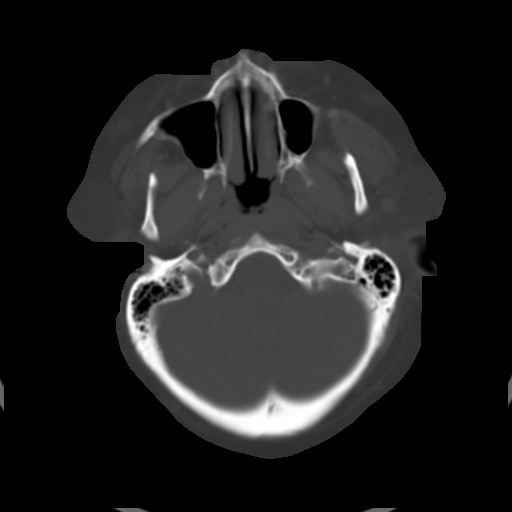
[im 7/30  brain]
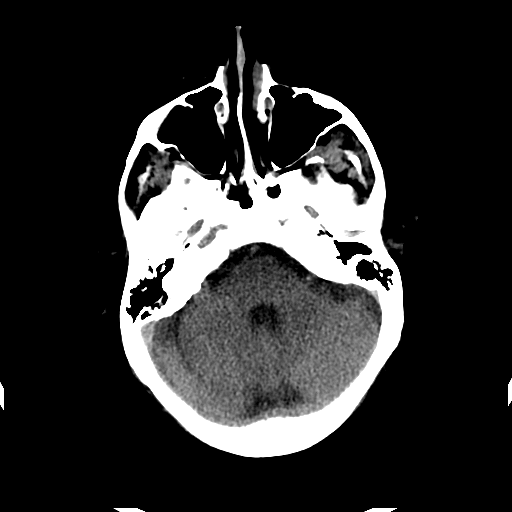
[im 10/30  brain]
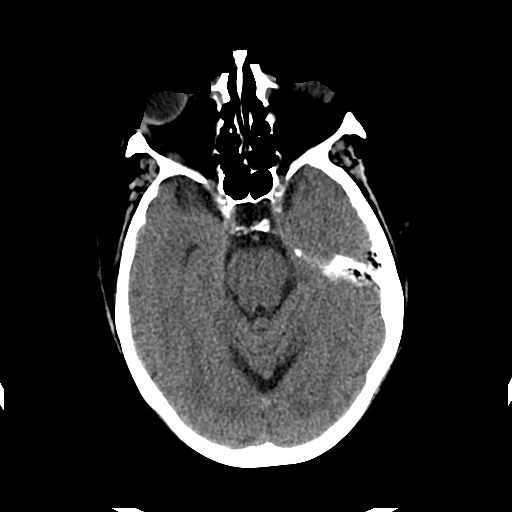
[im 13/30  brain]
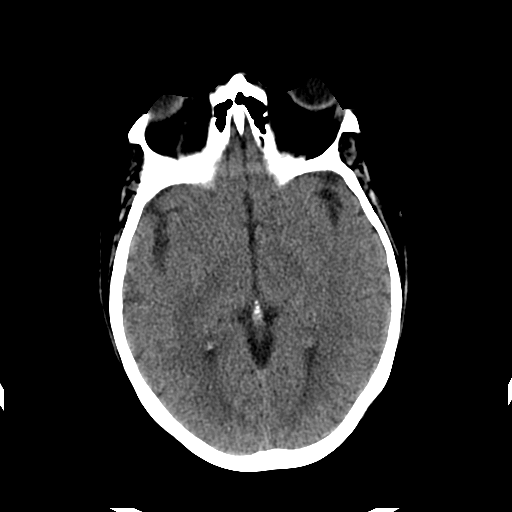
[im 17/30  brain]
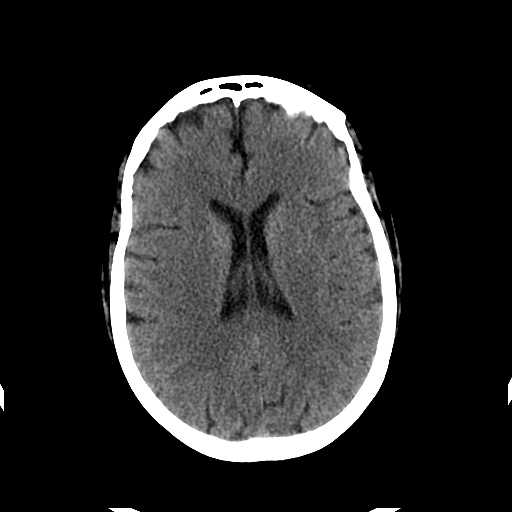
[im 17/30  bone]
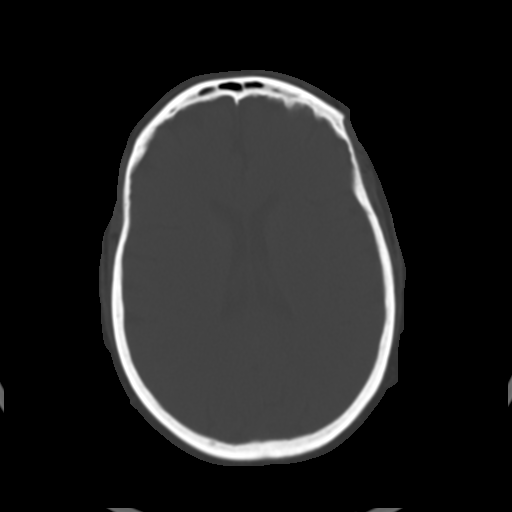
[im 20/30  brain]
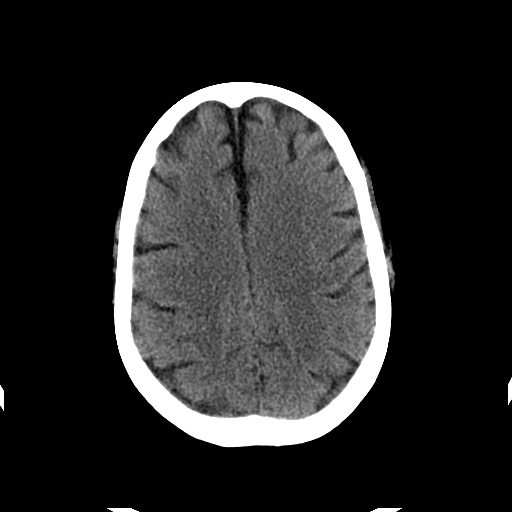
[im 23/30  brain]
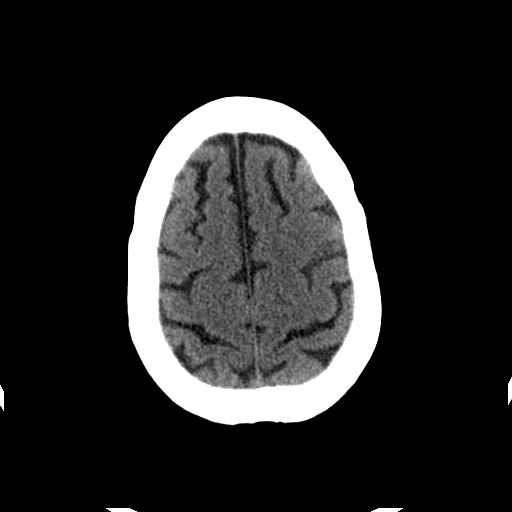
[im 26/30  brain]
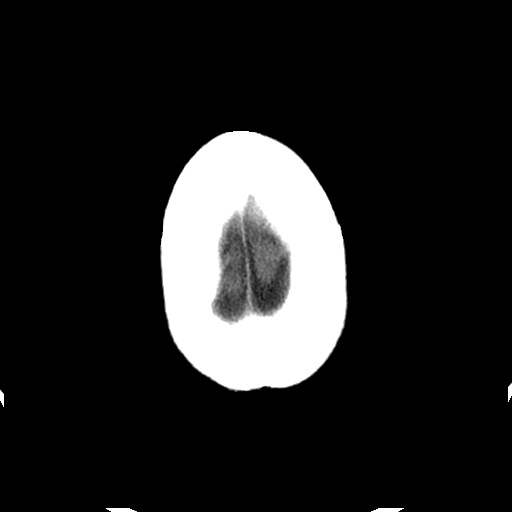

[Series 202: head w/o bone, idose (1) · axial · non-contrast · 0.45mm/px · z∈[+71,+186]mm · 8 of 60 slices shown]
[im 7/60  bone]
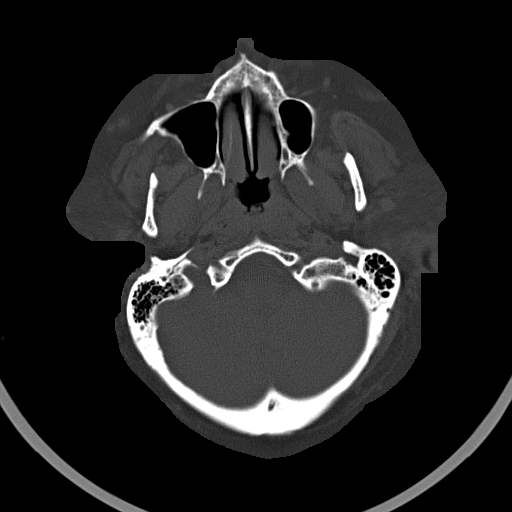
[im 13/60  bone]
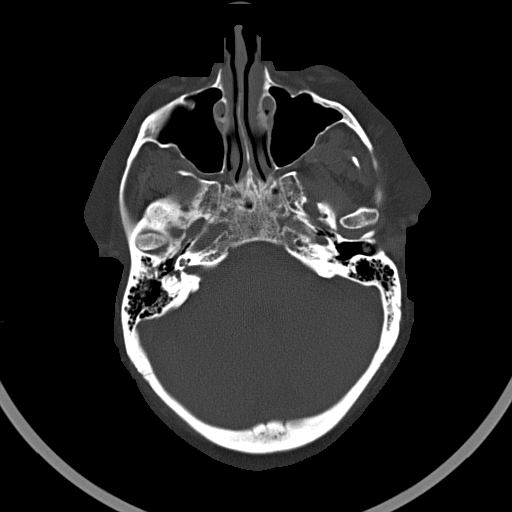
[im 19/60  bone]
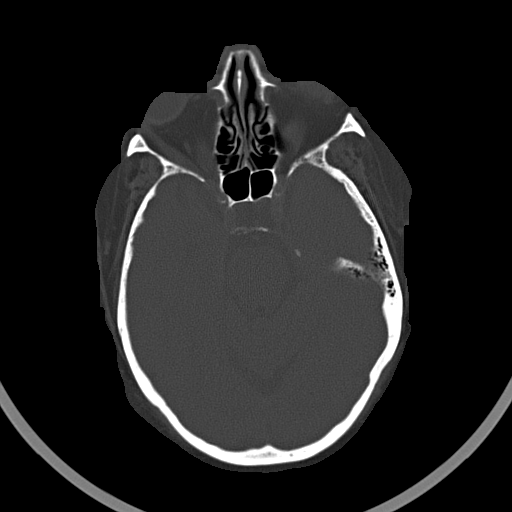
[im 25/60  bone]
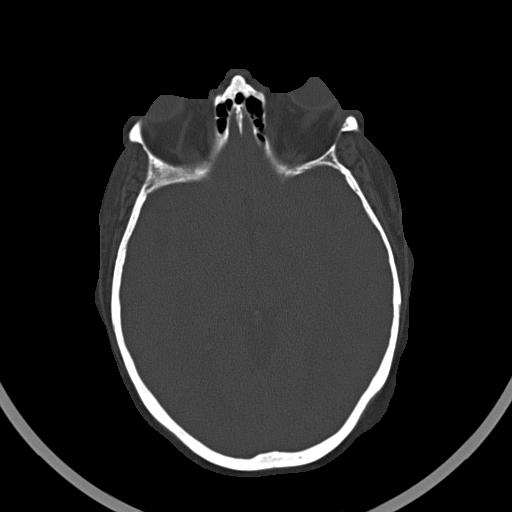
[im 35/60  bone]
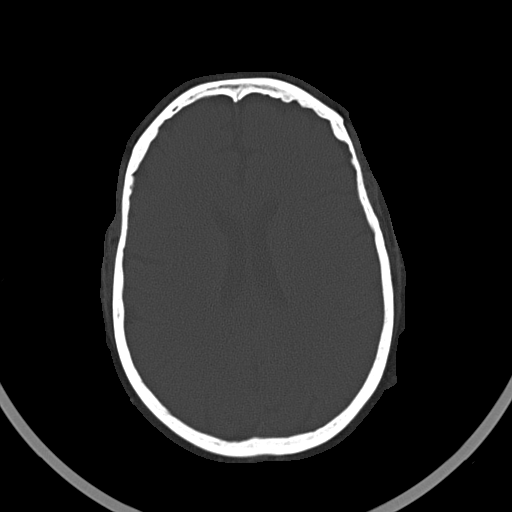
[im 41/60  bone]
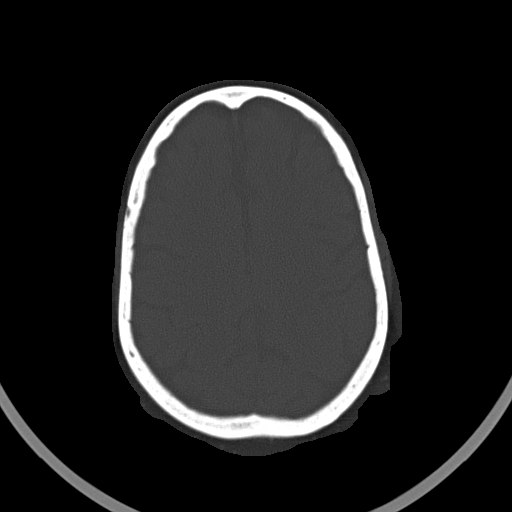
[im 47/60  bone]
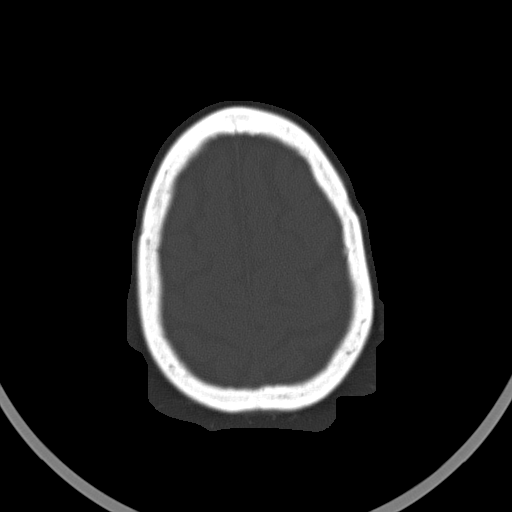
[im 53/60  bone]
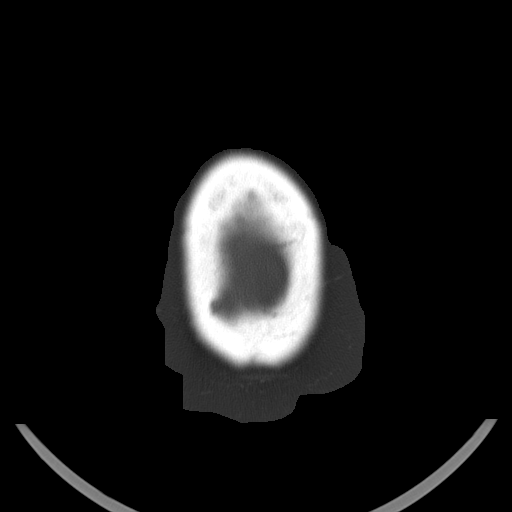

[16 of 30 positions shown; findings below may reference images not displayed]

FINDINGS: A mildly expanded partially empty sella is again noted. There is no
evidence of acute cortical infarct, intracranial hemorrhage, mass,
midline shift, or extra-axial fluid collection. Ventricles and sulci
are normal for age.

Bilateral exophthalmos is unchanged. Cerumen/ debris is noted in
both external auditory canals. The mastoid air cells are clear.
Small amount of left sphenoid sinus fluid and focal mucosal
thickening or mucous retention cyst in the right sphenoid sinus are
similar to the prior MRI.
IMPRESSION: No evidence of acute intracranial abnormality.

## 2016-05-12 ENCOUNTER — Ambulatory Visit: Payer: Medicare Other | Admitting: Neurology

## 2016-05-14 DIAGNOSIS — I129 Hypertensive chronic kidney disease with stage 1 through stage 4 chronic kidney disease, or unspecified chronic kidney disease: Secondary | ICD-10-CM | POA: Diagnosis not present

## 2016-05-14 DIAGNOSIS — E785 Hyperlipidemia, unspecified: Secondary | ICD-10-CM | POA: Diagnosis not present

## 2016-05-14 DIAGNOSIS — G4733 Obstructive sleep apnea (adult) (pediatric): Secondary | ICD-10-CM | POA: Diagnosis not present

## 2016-05-14 DIAGNOSIS — M545 Low back pain: Secondary | ICD-10-CM | POA: Diagnosis not present

## 2016-05-14 DIAGNOSIS — E1142 Type 2 diabetes mellitus with diabetic polyneuropathy: Secondary | ICD-10-CM | POA: Diagnosis not present

## 2016-05-14 DIAGNOSIS — E1122 Type 2 diabetes mellitus with diabetic chronic kidney disease: Secondary | ICD-10-CM | POA: Diagnosis not present

## 2016-05-14 DIAGNOSIS — Z794 Long term (current) use of insulin: Secondary | ICD-10-CM | POA: Diagnosis not present

## 2016-05-14 DIAGNOSIS — Z853 Personal history of malignant neoplasm of breast: Secondary | ICD-10-CM | POA: Diagnosis not present

## 2016-05-14 DIAGNOSIS — D649 Anemia, unspecified: Secondary | ICD-10-CM | POA: Diagnosis not present

## 2016-05-14 DIAGNOSIS — N183 Chronic kidney disease, stage 3 (moderate): Secondary | ICD-10-CM | POA: Diagnosis not present

## 2016-05-14 DIAGNOSIS — K219 Gastro-esophageal reflux disease without esophagitis: Secondary | ICD-10-CM | POA: Diagnosis not present

## 2016-05-14 DIAGNOSIS — E039 Hypothyroidism, unspecified: Secondary | ICD-10-CM | POA: Diagnosis not present

## 2016-05-14 DIAGNOSIS — Z9181 History of falling: Secondary | ICD-10-CM | POA: Diagnosis not present

## 2016-05-14 DIAGNOSIS — Z7982 Long term (current) use of aspirin: Secondary | ICD-10-CM | POA: Diagnosis not present

## 2016-05-14 DIAGNOSIS — G8929 Other chronic pain: Secondary | ICD-10-CM | POA: Diagnosis not present

## 2016-05-14 DIAGNOSIS — J45909 Unspecified asthma, uncomplicated: Secondary | ICD-10-CM | POA: Diagnosis not present

## 2016-05-14 DIAGNOSIS — M1990 Unspecified osteoarthritis, unspecified site: Secondary | ICD-10-CM | POA: Diagnosis not present

## 2016-05-14 DIAGNOSIS — G219 Secondary parkinsonism, unspecified: Secondary | ICD-10-CM | POA: Diagnosis not present

## 2016-05-15 DIAGNOSIS — E1165 Type 2 diabetes mellitus with hyperglycemia: Secondary | ICD-10-CM | POA: Diagnosis not present

## 2016-05-15 DIAGNOSIS — G9340 Encephalopathy, unspecified: Secondary | ICD-10-CM | POA: Diagnosis not present

## 2016-05-15 DIAGNOSIS — I10 Essential (primary) hypertension: Secondary | ICD-10-CM | POA: Diagnosis not present

## 2016-05-15 DIAGNOSIS — E039 Hypothyroidism, unspecified: Secondary | ICD-10-CM | POA: Diagnosis not present

## 2016-05-20 ENCOUNTER — Other Ambulatory Visit: Payer: Self-pay | Admitting: Internal Medicine

## 2016-05-20 DIAGNOSIS — Z1231 Encounter for screening mammogram for malignant neoplasm of breast: Secondary | ICD-10-CM

## 2016-05-21 DIAGNOSIS — G219 Secondary parkinsonism, unspecified: Secondary | ICD-10-CM | POA: Diagnosis not present

## 2016-05-21 DIAGNOSIS — D649 Anemia, unspecified: Secondary | ICD-10-CM | POA: Diagnosis not present

## 2016-05-21 DIAGNOSIS — K219 Gastro-esophageal reflux disease without esophagitis: Secondary | ICD-10-CM | POA: Diagnosis not present

## 2016-05-21 DIAGNOSIS — G4733 Obstructive sleep apnea (adult) (pediatric): Secondary | ICD-10-CM | POA: Diagnosis not present

## 2016-05-21 DIAGNOSIS — G8929 Other chronic pain: Secondary | ICD-10-CM | POA: Diagnosis not present

## 2016-05-21 DIAGNOSIS — M1990 Unspecified osteoarthritis, unspecified site: Secondary | ICD-10-CM | POA: Diagnosis not present

## 2016-05-21 DIAGNOSIS — E785 Hyperlipidemia, unspecified: Secondary | ICD-10-CM | POA: Diagnosis not present

## 2016-05-21 DIAGNOSIS — E039 Hypothyroidism, unspecified: Secondary | ICD-10-CM | POA: Diagnosis not present

## 2016-05-21 DIAGNOSIS — Z9181 History of falling: Secondary | ICD-10-CM | POA: Diagnosis not present

## 2016-05-21 DIAGNOSIS — J45909 Unspecified asthma, uncomplicated: Secondary | ICD-10-CM | POA: Diagnosis not present

## 2016-05-21 DIAGNOSIS — E1122 Type 2 diabetes mellitus with diabetic chronic kidney disease: Secondary | ICD-10-CM | POA: Diagnosis not present

## 2016-05-21 DIAGNOSIS — N183 Chronic kidney disease, stage 3 (moderate): Secondary | ICD-10-CM | POA: Diagnosis not present

## 2016-05-21 DIAGNOSIS — Z7982 Long term (current) use of aspirin: Secondary | ICD-10-CM | POA: Diagnosis not present

## 2016-05-21 DIAGNOSIS — Z794 Long term (current) use of insulin: Secondary | ICD-10-CM | POA: Diagnosis not present

## 2016-05-21 DIAGNOSIS — E1142 Type 2 diabetes mellitus with diabetic polyneuropathy: Secondary | ICD-10-CM | POA: Diagnosis not present

## 2016-05-21 DIAGNOSIS — Z853 Personal history of malignant neoplasm of breast: Secondary | ICD-10-CM | POA: Diagnosis not present

## 2016-05-21 DIAGNOSIS — M545 Low back pain: Secondary | ICD-10-CM | POA: Diagnosis not present

## 2016-05-21 DIAGNOSIS — I129 Hypertensive chronic kidney disease with stage 1 through stage 4 chronic kidney disease, or unspecified chronic kidney disease: Secondary | ICD-10-CM | POA: Diagnosis not present

## 2016-05-22 ENCOUNTER — Other Ambulatory Visit: Payer: Self-pay | Admitting: Internal Medicine

## 2016-05-22 DIAGNOSIS — Z7982 Long term (current) use of aspirin: Secondary | ICD-10-CM | POA: Diagnosis not present

## 2016-05-22 DIAGNOSIS — G219 Secondary parkinsonism, unspecified: Secondary | ICD-10-CM | POA: Diagnosis not present

## 2016-05-22 DIAGNOSIS — M1990 Unspecified osteoarthritis, unspecified site: Secondary | ICD-10-CM | POA: Diagnosis not present

## 2016-05-22 DIAGNOSIS — G4733 Obstructive sleep apnea (adult) (pediatric): Secondary | ICD-10-CM | POA: Diagnosis not present

## 2016-05-22 DIAGNOSIS — K219 Gastro-esophageal reflux disease without esophagitis: Secondary | ICD-10-CM | POA: Diagnosis not present

## 2016-05-22 DIAGNOSIS — D649 Anemia, unspecified: Secondary | ICD-10-CM | POA: Diagnosis not present

## 2016-05-22 DIAGNOSIS — I129 Hypertensive chronic kidney disease with stage 1 through stage 4 chronic kidney disease, or unspecified chronic kidney disease: Secondary | ICD-10-CM | POA: Diagnosis not present

## 2016-05-22 DIAGNOSIS — Z794 Long term (current) use of insulin: Secondary | ICD-10-CM | POA: Diagnosis not present

## 2016-05-22 DIAGNOSIS — E1142 Type 2 diabetes mellitus with diabetic polyneuropathy: Secondary | ICD-10-CM | POA: Diagnosis not present

## 2016-05-22 DIAGNOSIS — E039 Hypothyroidism, unspecified: Secondary | ICD-10-CM | POA: Diagnosis not present

## 2016-05-22 DIAGNOSIS — J45909 Unspecified asthma, uncomplicated: Secondary | ICD-10-CM | POA: Diagnosis not present

## 2016-05-22 DIAGNOSIS — Z853 Personal history of malignant neoplasm of breast: Secondary | ICD-10-CM

## 2016-05-22 DIAGNOSIS — E785 Hyperlipidemia, unspecified: Secondary | ICD-10-CM | POA: Diagnosis not present

## 2016-05-22 DIAGNOSIS — G8929 Other chronic pain: Secondary | ICD-10-CM | POA: Diagnosis not present

## 2016-05-22 DIAGNOSIS — Z9181 History of falling: Secondary | ICD-10-CM | POA: Diagnosis not present

## 2016-05-22 DIAGNOSIS — N183 Chronic kidney disease, stage 3 (moderate): Secondary | ICD-10-CM | POA: Diagnosis not present

## 2016-05-22 DIAGNOSIS — E1122 Type 2 diabetes mellitus with diabetic chronic kidney disease: Secondary | ICD-10-CM | POA: Diagnosis not present

## 2016-05-22 DIAGNOSIS — M545 Low back pain: Secondary | ICD-10-CM | POA: Diagnosis not present

## 2016-05-23 DIAGNOSIS — D649 Anemia, unspecified: Secondary | ICD-10-CM | POA: Diagnosis not present

## 2016-05-23 DIAGNOSIS — Z7982 Long term (current) use of aspirin: Secondary | ICD-10-CM | POA: Diagnosis not present

## 2016-05-23 DIAGNOSIS — Z853 Personal history of malignant neoplasm of breast: Secondary | ICD-10-CM | POA: Diagnosis not present

## 2016-05-23 DIAGNOSIS — N183 Chronic kidney disease, stage 3 (moderate): Secondary | ICD-10-CM | POA: Diagnosis not present

## 2016-05-23 DIAGNOSIS — E1122 Type 2 diabetes mellitus with diabetic chronic kidney disease: Secondary | ICD-10-CM | POA: Diagnosis not present

## 2016-05-23 DIAGNOSIS — I129 Hypertensive chronic kidney disease with stage 1 through stage 4 chronic kidney disease, or unspecified chronic kidney disease: Secondary | ICD-10-CM | POA: Diagnosis not present

## 2016-05-23 DIAGNOSIS — J45909 Unspecified asthma, uncomplicated: Secondary | ICD-10-CM | POA: Diagnosis not present

## 2016-05-23 DIAGNOSIS — E1142 Type 2 diabetes mellitus with diabetic polyneuropathy: Secondary | ICD-10-CM | POA: Diagnosis not present

## 2016-05-23 DIAGNOSIS — G219 Secondary parkinsonism, unspecified: Secondary | ICD-10-CM | POA: Diagnosis not present

## 2016-05-23 DIAGNOSIS — M1990 Unspecified osteoarthritis, unspecified site: Secondary | ICD-10-CM | POA: Diagnosis not present

## 2016-05-23 DIAGNOSIS — Z794 Long term (current) use of insulin: Secondary | ICD-10-CM | POA: Diagnosis not present

## 2016-05-23 DIAGNOSIS — M545 Low back pain: Secondary | ICD-10-CM | POA: Diagnosis not present

## 2016-05-23 DIAGNOSIS — G8929 Other chronic pain: Secondary | ICD-10-CM | POA: Diagnosis not present

## 2016-05-23 DIAGNOSIS — Z9181 History of falling: Secondary | ICD-10-CM | POA: Diagnosis not present

## 2016-05-23 DIAGNOSIS — E785 Hyperlipidemia, unspecified: Secondary | ICD-10-CM | POA: Diagnosis not present

## 2016-05-23 DIAGNOSIS — G4733 Obstructive sleep apnea (adult) (pediatric): Secondary | ICD-10-CM | POA: Diagnosis not present

## 2016-05-23 DIAGNOSIS — K219 Gastro-esophageal reflux disease without esophagitis: Secondary | ICD-10-CM | POA: Diagnosis not present

## 2016-05-23 DIAGNOSIS — E039 Hypothyroidism, unspecified: Secondary | ICD-10-CM | POA: Diagnosis not present

## 2016-05-27 ENCOUNTER — Ambulatory Visit
Admission: RE | Admit: 2016-05-27 | Discharge: 2016-05-27 | Disposition: A | Discharge status: Home / Self Care | Payer: Medicare Other | Source: Ambulatory Visit | Attending: Internal Medicine | Admitting: Internal Medicine

## 2016-05-27 DIAGNOSIS — R928 Other abnormal and inconclusive findings on diagnostic imaging of breast: Secondary | ICD-10-CM | POA: Diagnosis not present

## 2016-05-27 DIAGNOSIS — Z853 Personal history of malignant neoplasm of breast: Secondary | ICD-10-CM

## 2016-05-27 HISTORY — DX: Personal history of irradiation: Z92.3

## 2016-05-28 ENCOUNTER — Telehealth: Payer: Self-pay | Admitting: Oncology

## 2016-05-28 DIAGNOSIS — J45909 Unspecified asthma, uncomplicated: Secondary | ICD-10-CM | POA: Diagnosis not present

## 2016-05-28 DIAGNOSIS — Z853 Personal history of malignant neoplasm of breast: Secondary | ICD-10-CM | POA: Diagnosis not present

## 2016-05-28 DIAGNOSIS — D649 Anemia, unspecified: Secondary | ICD-10-CM | POA: Diagnosis not present

## 2016-05-28 DIAGNOSIS — E039 Hypothyroidism, unspecified: Secondary | ICD-10-CM | POA: Diagnosis not present

## 2016-05-28 DIAGNOSIS — G4733 Obstructive sleep apnea (adult) (pediatric): Secondary | ICD-10-CM | POA: Diagnosis not present

## 2016-05-28 DIAGNOSIS — G219 Secondary parkinsonism, unspecified: Secondary | ICD-10-CM | POA: Diagnosis not present

## 2016-05-28 DIAGNOSIS — M1990 Unspecified osteoarthritis, unspecified site: Secondary | ICD-10-CM | POA: Diagnosis not present

## 2016-05-28 DIAGNOSIS — Z9181 History of falling: Secondary | ICD-10-CM | POA: Diagnosis not present

## 2016-05-28 DIAGNOSIS — N183 Chronic kidney disease, stage 3 (moderate): Secondary | ICD-10-CM | POA: Diagnosis not present

## 2016-05-28 DIAGNOSIS — E1142 Type 2 diabetes mellitus with diabetic polyneuropathy: Secondary | ICD-10-CM | POA: Diagnosis not present

## 2016-05-28 DIAGNOSIS — Z7982 Long term (current) use of aspirin: Secondary | ICD-10-CM | POA: Diagnosis not present

## 2016-05-28 DIAGNOSIS — E1122 Type 2 diabetes mellitus with diabetic chronic kidney disease: Secondary | ICD-10-CM | POA: Diagnosis not present

## 2016-05-28 DIAGNOSIS — I129 Hypertensive chronic kidney disease with stage 1 through stage 4 chronic kidney disease, or unspecified chronic kidney disease: Secondary | ICD-10-CM | POA: Diagnosis not present

## 2016-05-28 DIAGNOSIS — M545 Low back pain: Secondary | ICD-10-CM | POA: Diagnosis not present

## 2016-05-28 DIAGNOSIS — G8929 Other chronic pain: Secondary | ICD-10-CM | POA: Diagnosis not present

## 2016-05-28 DIAGNOSIS — Z794 Long term (current) use of insulin: Secondary | ICD-10-CM | POA: Diagnosis not present

## 2016-05-28 DIAGNOSIS — K219 Gastro-esophageal reflux disease without esophagitis: Secondary | ICD-10-CM | POA: Diagnosis not present

## 2016-05-28 DIAGNOSIS — E785 Hyperlipidemia, unspecified: Secondary | ICD-10-CM | POA: Diagnosis not present

## 2016-05-28 NOTE — Telephone Encounter (Signed)
sw pt to confirm 4/16 appt at 1130 am per LOS

## 2016-05-29 ENCOUNTER — Ambulatory Visit (INDEPENDENT_AMBULATORY_CARE_PROVIDER_SITE_OTHER): Payer: Medicare Other | Admitting: Neurology

## 2016-05-29 ENCOUNTER — Encounter: Payer: Self-pay | Admitting: Neurology

## 2016-05-29 VITALS — BP 188/88 | HR 126 | Ht 65.0 in | Wt 322.0 lb

## 2016-05-29 DIAGNOSIS — R269 Unspecified abnormalities of gait and mobility: Secondary | ICD-10-CM | POA: Diagnosis not present

## 2016-05-29 DIAGNOSIS — R1312 Dysphagia, oropharyngeal phase: Secondary | ICD-10-CM

## 2016-05-29 DIAGNOSIS — G4733 Obstructive sleep apnea (adult) (pediatric): Secondary | ICD-10-CM | POA: Diagnosis not present

## 2016-05-29 DIAGNOSIS — G8929 Other chronic pain: Secondary | ICD-10-CM | POA: Diagnosis not present

## 2016-05-29 DIAGNOSIS — M5442 Lumbago with sciatica, left side: Secondary | ICD-10-CM

## 2016-05-29 HISTORY — DX: Unspecified abnormalities of gait and mobility: R26.9

## 2016-05-29 NOTE — Patient Instructions (Signed)
   We will get a swallowing evaluation set up, and get a sleep doctor to see you.

## 2016-05-29 NOTE — Progress Notes (Signed)
Reason for visit: Gait disorder  Referring physician: [facility]  [name] is a 61 y.o. female  History of present illness:  [name] is a 61 year old right-handed black female with a history of schizophrenia, obesity, chronic low back pain, and diabetes. 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 The patient at that time was on Abilify, and eventually she was taken off of this medication. The patient has been placed back on Geodon about a year and a half ago, the patient claims that about one year ago she began having a change in her ability to walk. The patient does have chronic low back pain with left-sided sciatica. She is followed through [facility] for this. She has been on chronic opioid therapy and benzodiazepines. In September 2016, the patient had problems with altered mental status that were felt to be related to an overdose of these medications, and she was cutback on her pain medications at that time. The patient had a similar recurrence in March 2018 necessitating an admission to the hospital. Once again, the patient has been cut back on her opiate medications and benzodiazepine medication. The patient has had increased low back pain. The patient claims that she has developed a tremor that is a resting tremor of the right arm, she reports problems with swallowing with solids and liquids, mainly solids. The patient claims that she cannot get the food to go down her throat. The patient claims that she has been at risk for pneumonia. The patient also states that she is shuffling her feet, she has had some falls even while using a walker. The last fall was about 4 days prior to this evaluation. The patient reports numbness in the hands, not in the feet. The patient has had bilateral total knee replacements. The patient reports some dizziness and headache, slight memory changes. The patient is not sleeping well,  she has some insomnia problems and she is on CPAP for sleep apnea. The patient has not been followed by a sleep physician for over 10 years. The patient currently is getting physical therapy in the home environment, she lives alone. She gets aid and assistance to help her with bathing. She just recently had a CT scan of the head in the hospital on 04/23/2016, this study was unremarkable. She is sent to this office for evaluation of possible parkinsonism.  Past Medical History:  Diagnosis Date  . Anemia   . Anxiety   . Arthritis    "pretty much all my joints"  . Asthma   . Benign paroxysmal positional vertigo 12/30/2012  . Breast cancer ([place]) 02/13/12   ruq  100'clock bx Ductal Carcinoma in Situ,(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  . Breast mass in female    L breast 2008, US showed likely fat necrosis  . Chronic lower back pain   . CKD (chronic kidney disease), stage III    "lower stage" (01/06/2014)  . Daily headache   . Depression   . GERD (gastroesophageal reflux disease)   . History of blood transfusion 2011   "after one of my OR's"  . History of hiatal hernia   . History of stomach ulcers   . HSV (herpes simplex virus) infection   . Hx of radiation therapy 05/05/12- 07/15/12   right breast, 45 gray x 25 fx, lumpectomy cavity boosted to 16.2 gray  . Hyperlipemia   . Hypertension    sees Dr. Criss Perez , [name]  .  Hypothyroidism   . Kidney stones   . Knee pain, bilateral   . Migraines    "~ 3 times/month" (01/06/2014)  . Obesity   . OSA on CPAP    pt does not know settings  . Pancreatitis    hx of  . Personal history of radiation therapy 02/2012  . Pneumonia 1950's  . Polyneuropathy in diabetes(357.2)   . Schizophrenia ([place])   . 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 Tachycardia    with sx in 2008  . Thyroid disease   . Type II diabetes mellitus ([place])     Past Surgical History:  Procedure Laterality Date  . ABDOMINAL HYSTERECTOMY  1979?   partial  . BREAST LUMPECTOMY   03/03/2012   Procedure: LUMPECTOMY;  Surgeon: Haywood Lasso, MD;  Location: [facility];  Service: General;  Laterality: Right;  . BREAST SURGERY Right 01/2012   "cancer"  . CHOLECYSTECTOMY  1980's?  . COLONOSCOPY WITH PROPOFOL N/A 09/28/2012   Procedure: COLONOSCOPY WITH PROPOFOL;  Surgeon: Lear Ng, MD;  Location: [facility];  Service: Endoscopy;  Laterality: N/A;  . FOOT FRACTURE SURGERY Right 1990's  . JOINT REPLACEMENT    . REVISION TOTAL KNEE ARTHROPLASTY  07/2009  . SHOULDER OPEN ROTATOR CUFF REPAIR Left 1990's  . THYROIDECTOMY  1970's  . TOTAL KNEE ARTHROPLASTY Bilateral 2009-06/2009   left; right    Family History  Problem Relation Age of Onset  . Heart disease Brother     Multiple MIs, starting in his 45s  . Diabetes Brother   . Hypertension Mother   . Diabetes Mother   . Breast cancer Mother 45  . Bone cancer Mother   . Hypertension Sister   . Diabetes Sister   . Breast cancer Sister 64  . Breast cancer Maternal Grandmother   . Heart disease Maternal Grandmother   . Uterine cancer Other 19  . Breast cancer Paternal Aunt 48  . Breast cancer Paternal Grandmother     dx in her 39s  . Prostate cancer Paternal Grandfather     Social history:  reports that she quit smoking about 24 years ago. Her smoking use included Cigarettes. She has a 7.00 pack-year smoking history. She has never used smokeless tobacco. She reports that she does not drink alcohol or use drugs.  Medications:  Prior to Admission medications   Medication Sig Start Date End Date Taking? Authorizing Provider  acetaminophen (TYLENOL) 650 MG CR tablet Take 650 mg by mouth every 4 (four) hours. Per SO   Yes Historical Provider, MD  albuterol (PROAIR HFA) 108 (90 Base) MCG/ACT inhaler Inhale 2 puffs into the lungs every 4 (four) hours as needed for wheezing or shortness of breath.    Yes Historical Provider, MD  albuterol (PROVENTIL) (2.5 MG/3ML) 0.083% nebulizer solution Inhale 3 mLs into the  lungs every 6 (six) hours as needed. For shortness of breath 11/03/15  Yes Historical Provider, MD  aspirin EC 81 MG tablet Take 1 tablet (81 mg total) by mouth daily. 08/12/13  Yes Adeline Saralyn Pilar, MD  B Complex-C (B-COMPLEX WITH VITAMIN C) tablet Take 1 tablet by mouth daily.   Yes Historical Provider, MD  carvedilol (COREG) 3.125 MG tablet Take 1 tablet (3.125 mg total) by mouth 2 (two) times daily with a meal. 02/17/16  Yes Florencia Reasons, MD  Cholecalciferol (VITAMIN D-3) 1000 units CAPS Take 1,000 Units by mouth daily.   Yes Historical Provider, MD  docusate sodium (COLACE) 100 MG capsule Take 100  mg by mouth at bedtime.   Yes Historical Provider, MD  esomeprazole (NEXIUM) 40 MG capsule Take 1 capsule (40 mg total) by mouth daily before breakfast. 10/30/14  Yes Barton Dubois, MD  furosemide (LASIX) 40 MG tablet Take 1 tablet (40 mg total) by mouth daily. 02/17/16  Yes Florencia Reasons, MD  insulin aspart (NOVOLOG) 100 UNIT/ML injection Inject 0-9 Units into the skin 3 (three) times daily with meals. 0-9 Units, Subcutaneous CBG < 70: implement hypoglycemia protocol CBG 70 - 120: 0 units CBG 121 - 150: 1 unit CBG 151 - 200: 2 units CBG 201 - 250: 3 units CBG 251 - 300: 5 units CBG 301 - 350: 7 units CBG 351 - 400: 9 units CBG > 400: call MD 04/25/16  Yes Shanker Kristeen Mans, MD  Insulin Detemir (LEVEMIR FLEXTOUCH) 100 UNIT/ML Pen Inject 10 Units into the skin at bedtime.    Yes Historical Provider, MD  JANUMET 50-500 MG tablet Take 1 tablet by mouth daily. 04/16/16  Yes Historical Provider, MD  levothyroxine (SYNTHROID, LEVOTHROID) 150 MCG tablet Take 150 mcg by mouth daily before breakfast.   Yes Historical Provider, MD  linaclotide (LINZESS) 290 MCG CAPS capsule Take 290 mcg by mouth daily before breakfast.   Yes Historical Provider, MD  LORazepam (ATIVAN) 1 MG tablet Take 1-2 mg by mouth 2 (two) times daily. 72m in morning and 2 mg in the evening    Yes Historical Provider, MD  LYRICA 225 MG capsule Take 225  mg by mouth daily. 05/20/16  Yes Historical Provider, MD  Multiple Vitamin (MULTIVITAMIN WITH MINERALS) TABS tablet Take 1 tablet by mouth daily.   Yes Historical Provider, MD  pregabalin (LYRICA) 200 MG capsule Take 1 capsule (200 mg total) by mouth daily. 04/25/16  Yes Shanker MKristeen Mans MD  rosuvastatin (CRESTOR) 20 MG tablet Take 20 mg by mouth daily.   Yes Historical Provider, MD  tiZANidine (ZANAFLEX) 2 MG tablet Take 2 mg by mouth. 03/25/16  Yes Historical Provider, MD  valsartan-hydrochlorothiazide (DIOVAN-HCT) 160-12.5 MG tablet Take 1 tablet by mouth. 05/12/16  Yes Historical Provider, MD  venlafaxine XR (EFFEXOR-XR) 150 MG 24 hr capsule Take 150 mg by mouth 2 (two) times daily. 05/21/15  Yes Historical Provider, MD  ziprasidone (GEODON) 20 MG capsule Take 1 capsule (20 mg total) by mouth 2 (two) times daily with a meal. 04/25/16  Yes Shanker MKristeen Mans MD      Allergies  Allergen Reactions  . Bee Venom Anaphylaxis  . Shellfish Allergy Anaphylaxis  . Buprenorphine Hcl Other (See Comments)    Overly sedated    ROS:  Out of a complete 14 system review of symptoms, the patient complains only of the following symptoms, and all other reviewed systems are negative.  Decreased activity, chills, fatigue Facial swelling, ringing in ears, difficulty swallowing Eye itching, eye redness, double vision, eye pain, blurred vision Shortness of breath, choking Leg swelling Cold intolerance, excessive thirst, excessive eating Constipation Insomnia, sleep apnea, frequent waking, snoring Environmental allergies, food allergies Incontinence of the bladder, frequency of urination, urinary urgency Joint pain, back pain, achy muscles, muscle cramps, walking difficulty Bruising easily, anemia Memory loss, dizziness, headache, numbness, speech difficulty, weakness, tremors, passing out, facial drooping Agitation, confusion, decreased concentration, depression, anxiety, hallucinations  Blood pressure (!)  188/88, pulse (!) 126, height _0  (1.651 m), weight (!) 322 lb (146.1 kg).  Physical Exam  General: The patient is alert and cooperative at the time of the examination. The patient  is markedly obese.  Eyes: Pupils are equal, round, and reactive to light. Discs are flat bilaterally.  Neck: The neck is supple, no carotid bruits are noted.  Respiratory: The respiratory examination is clear.  Cardiovascular: The cardiovascular examination reveals a regular rate and rhythm, no obvious murmurs or rubs are noted.  Skin: Extremities are with 3+ edema below the knees bilaterally.  Neurologic Exam  Mental status: The patient is alert and oriented x 3 at the time of the examination. The patient has apparent normal recent and remote memory, with an apparently normal attention span and concentration ability.  Cranial nerves: Facial symmetry is present. There is good sensation of the face to pinprick and soft touch bilaterally. The strength of the facial muscles and the muscles to head turning and shoulder shrug are normal bilaterally. Speech is well enunciated, no aphasia or dysarthria is noted. Extraocular movements are full. Visual fields are full. The tongue is midline, and the patient has symmetric elevation of the soft palate. No obvious hearing deficits are noted.  Motor: The motor testing reveals 5 over 5 strength of all 4 extremities. Good symmetric motor tone is noted throughout.  Sensory: Sensory testing is intact to pinprick, soft touch and vibration sensation on all 4 extremities. No evidence of extinction is noted.  Coordination: Cerebellar testing reveals good finger-nose-finger and heel-to-shin bilaterally.  Gait and station: Gait is slightly wide-based, the patient walks with a walker, is stooped with the low back. Romberg is negative. Tandem gait was not attempted.  Reflexes: Deep tendon reflexes are symmetric, but are depressed bilaterally.   CT head 04/23/16:  IMPRESSION: 1.  Normal CT appearance of the brain. No acute or focal lesion to explain abnormal speech or weakness. 2. ASPECTS is 10/10  * CT scan images were reviewed online. I agree with the written report.    Assessment/Plan:  1. Chronic low back pain  2. Gait disorder  3. Reported dysphagia  4. Sleep apnea on CPAP  The patient is on Geodon, she may be at risk for secondary parkinsonism, but I do not see features of Parkinson's disease on the current examination. The patient claims to have a right sided resting tremor, this is not observed. The patient walks with a slightly wide-based stance, her gait issues appear to be mainly related to her chronic back pain and bilateral knee arthritis. The patient has had bilateral total knee replacements. I will follow the patient over time for developing signs of parkinsonism, clearly she had this previously in 2014 associated with the use of Abilify. The patient will be sent for a modified barium swallow evaluation. She will be referred for a sleep evaluation to help manage her CPAP. She will follow-up in 6 months.  Jill Alexanders MD 05/29/2016 8:07 AM  [facility] [address] [place], [place] [address]  Phone [phone] Fax [phone]

## 2016-05-30 DIAGNOSIS — G8929 Other chronic pain: Secondary | ICD-10-CM | POA: Diagnosis not present

## 2016-05-30 DIAGNOSIS — G219 Secondary parkinsonism, unspecified: Secondary | ICD-10-CM | POA: Diagnosis not present

## 2016-05-30 DIAGNOSIS — I129 Hypertensive chronic kidney disease with stage 1 through stage 4 chronic kidney disease, or unspecified chronic kidney disease: Secondary | ICD-10-CM | POA: Diagnosis not present

## 2016-05-30 DIAGNOSIS — J45909 Unspecified asthma, uncomplicated: Secondary | ICD-10-CM | POA: Diagnosis not present

## 2016-05-30 DIAGNOSIS — G4733 Obstructive sleep apnea (adult) (pediatric): Secondary | ICD-10-CM | POA: Diagnosis not present

## 2016-05-30 DIAGNOSIS — Z794 Long term (current) use of insulin: Secondary | ICD-10-CM | POA: Diagnosis not present

## 2016-05-30 DIAGNOSIS — M1990 Unspecified osteoarthritis, unspecified site: Secondary | ICD-10-CM | POA: Diagnosis not present

## 2016-05-30 DIAGNOSIS — E039 Hypothyroidism, unspecified: Secondary | ICD-10-CM | POA: Diagnosis not present

## 2016-05-30 DIAGNOSIS — Z853 Personal history of malignant neoplasm of breast: Secondary | ICD-10-CM | POA: Diagnosis not present

## 2016-05-30 DIAGNOSIS — D649 Anemia, unspecified: Secondary | ICD-10-CM | POA: Diagnosis not present

## 2016-05-30 DIAGNOSIS — E1122 Type 2 diabetes mellitus with diabetic chronic kidney disease: Secondary | ICD-10-CM | POA: Diagnosis not present

## 2016-05-30 DIAGNOSIS — K219 Gastro-esophageal reflux disease without esophagitis: Secondary | ICD-10-CM | POA: Diagnosis not present

## 2016-05-30 DIAGNOSIS — Z9181 History of falling: Secondary | ICD-10-CM | POA: Diagnosis not present

## 2016-05-30 DIAGNOSIS — E1142 Type 2 diabetes mellitus with diabetic polyneuropathy: Secondary | ICD-10-CM | POA: Diagnosis not present

## 2016-05-30 DIAGNOSIS — Z7982 Long term (current) use of aspirin: Secondary | ICD-10-CM | POA: Diagnosis not present

## 2016-05-30 DIAGNOSIS — E785 Hyperlipidemia, unspecified: Secondary | ICD-10-CM | POA: Diagnosis not present

## 2016-05-30 DIAGNOSIS — N183 Chronic kidney disease, stage 3 (moderate): Secondary | ICD-10-CM | POA: Diagnosis not present

## 2016-05-30 DIAGNOSIS — M545 Low back pain: Secondary | ICD-10-CM | POA: Diagnosis not present

## 2016-06-02 ENCOUNTER — Ambulatory Visit: Payer: Self-pay | Admitting: Adult Health

## 2016-06-03 DIAGNOSIS — M1990 Unspecified osteoarthritis, unspecified site: Secondary | ICD-10-CM | POA: Diagnosis not present

## 2016-06-03 DIAGNOSIS — I129 Hypertensive chronic kidney disease with stage 1 through stage 4 chronic kidney disease, or unspecified chronic kidney disease: Secondary | ICD-10-CM | POA: Diagnosis not present

## 2016-06-03 DIAGNOSIS — N183 Chronic kidney disease, stage 3 (moderate): Secondary | ICD-10-CM | POA: Diagnosis not present

## 2016-06-03 DIAGNOSIS — D649 Anemia, unspecified: Secondary | ICD-10-CM | POA: Diagnosis not present

## 2016-06-03 DIAGNOSIS — G219 Secondary parkinsonism, unspecified: Secondary | ICD-10-CM | POA: Diagnosis not present

## 2016-06-03 DIAGNOSIS — Z853 Personal history of malignant neoplasm of breast: Secondary | ICD-10-CM | POA: Diagnosis not present

## 2016-06-03 DIAGNOSIS — G4733 Obstructive sleep apnea (adult) (pediatric): Secondary | ICD-10-CM | POA: Diagnosis not present

## 2016-06-03 DIAGNOSIS — G8929 Other chronic pain: Secondary | ICD-10-CM | POA: Diagnosis not present

## 2016-06-03 DIAGNOSIS — E039 Hypothyroidism, unspecified: Secondary | ICD-10-CM | POA: Diagnosis not present

## 2016-06-03 DIAGNOSIS — Z9181 History of falling: Secondary | ICD-10-CM | POA: Diagnosis not present

## 2016-06-03 DIAGNOSIS — J45909 Unspecified asthma, uncomplicated: Secondary | ICD-10-CM | POA: Diagnosis not present

## 2016-06-03 DIAGNOSIS — E1142 Type 2 diabetes mellitus with diabetic polyneuropathy: Secondary | ICD-10-CM | POA: Diagnosis not present

## 2016-06-03 DIAGNOSIS — Z7982 Long term (current) use of aspirin: Secondary | ICD-10-CM | POA: Diagnosis not present

## 2016-06-03 DIAGNOSIS — Z794 Long term (current) use of insulin: Secondary | ICD-10-CM | POA: Diagnosis not present

## 2016-06-03 DIAGNOSIS — M545 Low back pain: Secondary | ICD-10-CM | POA: Diagnosis not present

## 2016-06-03 DIAGNOSIS — E785 Hyperlipidemia, unspecified: Secondary | ICD-10-CM | POA: Diagnosis not present

## 2016-06-03 DIAGNOSIS — E1122 Type 2 diabetes mellitus with diabetic chronic kidney disease: Secondary | ICD-10-CM | POA: Diagnosis not present

## 2016-06-03 DIAGNOSIS — K219 Gastro-esophageal reflux disease without esophagitis: Secondary | ICD-10-CM | POA: Diagnosis not present

## 2016-06-05 DIAGNOSIS — Z9181 History of falling: Secondary | ICD-10-CM | POA: Diagnosis not present

## 2016-06-05 DIAGNOSIS — E039 Hypothyroidism, unspecified: Secondary | ICD-10-CM | POA: Diagnosis not present

## 2016-06-05 DIAGNOSIS — Z794 Long term (current) use of insulin: Secondary | ICD-10-CM | POA: Diagnosis not present

## 2016-06-05 DIAGNOSIS — I129 Hypertensive chronic kidney disease with stage 1 through stage 4 chronic kidney disease, or unspecified chronic kidney disease: Secondary | ICD-10-CM | POA: Diagnosis not present

## 2016-06-05 DIAGNOSIS — G4733 Obstructive sleep apnea (adult) (pediatric): Secondary | ICD-10-CM | POA: Diagnosis not present

## 2016-06-05 DIAGNOSIS — Z853 Personal history of malignant neoplasm of breast: Secondary | ICD-10-CM | POA: Diagnosis not present

## 2016-06-05 DIAGNOSIS — D649 Anemia, unspecified: Secondary | ICD-10-CM | POA: Diagnosis not present

## 2016-06-05 DIAGNOSIS — E1122 Type 2 diabetes mellitus with diabetic chronic kidney disease: Secondary | ICD-10-CM | POA: Diagnosis not present

## 2016-06-05 DIAGNOSIS — J45909 Unspecified asthma, uncomplicated: Secondary | ICD-10-CM | POA: Diagnosis not present

## 2016-06-05 DIAGNOSIS — E1142 Type 2 diabetes mellitus with diabetic polyneuropathy: Secondary | ICD-10-CM | POA: Diagnosis not present

## 2016-06-05 DIAGNOSIS — E785 Hyperlipidemia, unspecified: Secondary | ICD-10-CM | POA: Diagnosis not present

## 2016-06-05 DIAGNOSIS — K219 Gastro-esophageal reflux disease without esophagitis: Secondary | ICD-10-CM | POA: Diagnosis not present

## 2016-06-05 DIAGNOSIS — G8929 Other chronic pain: Secondary | ICD-10-CM | POA: Diagnosis not present

## 2016-06-05 DIAGNOSIS — M545 Low back pain: Secondary | ICD-10-CM | POA: Diagnosis not present

## 2016-06-05 DIAGNOSIS — Z7982 Long term (current) use of aspirin: Secondary | ICD-10-CM | POA: Diagnosis not present

## 2016-06-05 DIAGNOSIS — G219 Secondary parkinsonism, unspecified: Secondary | ICD-10-CM | POA: Diagnosis not present

## 2016-06-05 DIAGNOSIS — N183 Chronic kidney disease, stage 3 (moderate): Secondary | ICD-10-CM | POA: Diagnosis not present

## 2016-06-05 DIAGNOSIS — M1990 Unspecified osteoarthritis, unspecified site: Secondary | ICD-10-CM | POA: Diagnosis not present

## 2016-06-06 DIAGNOSIS — Z853 Personal history of malignant neoplasm of breast: Secondary | ICD-10-CM | POA: Diagnosis not present

## 2016-06-06 DIAGNOSIS — D649 Anemia, unspecified: Secondary | ICD-10-CM | POA: Diagnosis not present

## 2016-06-06 DIAGNOSIS — I129 Hypertensive chronic kidney disease with stage 1 through stage 4 chronic kidney disease, or unspecified chronic kidney disease: Secondary | ICD-10-CM | POA: Diagnosis not present

## 2016-06-06 DIAGNOSIS — E785 Hyperlipidemia, unspecified: Secondary | ICD-10-CM | POA: Diagnosis not present

## 2016-06-06 DIAGNOSIS — M1990 Unspecified osteoarthritis, unspecified site: Secondary | ICD-10-CM | POA: Diagnosis not present

## 2016-06-06 DIAGNOSIS — E1122 Type 2 diabetes mellitus with diabetic chronic kidney disease: Secondary | ICD-10-CM | POA: Diagnosis not present

## 2016-06-06 DIAGNOSIS — G4733 Obstructive sleep apnea (adult) (pediatric): Secondary | ICD-10-CM | POA: Diagnosis not present

## 2016-06-06 DIAGNOSIS — N183 Chronic kidney disease, stage 3 (moderate): Secondary | ICD-10-CM | POA: Diagnosis not present

## 2016-06-06 DIAGNOSIS — M545 Low back pain: Secondary | ICD-10-CM | POA: Diagnosis not present

## 2016-06-06 DIAGNOSIS — G219 Secondary parkinsonism, unspecified: Secondary | ICD-10-CM | POA: Diagnosis not present

## 2016-06-06 DIAGNOSIS — G8929 Other chronic pain: Secondary | ICD-10-CM | POA: Diagnosis not present

## 2016-06-06 DIAGNOSIS — Z9181 History of falling: Secondary | ICD-10-CM | POA: Diagnosis not present

## 2016-06-06 DIAGNOSIS — J45909 Unspecified asthma, uncomplicated: Secondary | ICD-10-CM | POA: Diagnosis not present

## 2016-06-06 DIAGNOSIS — K219 Gastro-esophageal reflux disease without esophagitis: Secondary | ICD-10-CM | POA: Diagnosis not present

## 2016-06-06 DIAGNOSIS — E1142 Type 2 diabetes mellitus with diabetic polyneuropathy: Secondary | ICD-10-CM | POA: Diagnosis not present

## 2016-06-06 DIAGNOSIS — Z7982 Long term (current) use of aspirin: Secondary | ICD-10-CM | POA: Diagnosis not present

## 2016-06-06 DIAGNOSIS — Z794 Long term (current) use of insulin: Secondary | ICD-10-CM | POA: Diagnosis not present

## 2016-06-06 DIAGNOSIS — E039 Hypothyroidism, unspecified: Secondary | ICD-10-CM | POA: Diagnosis not present

## 2016-06-09 ENCOUNTER — Telehealth: Payer: Self-pay

## 2016-06-09 ENCOUNTER — Ambulatory Visit (HOSPITAL_BASED_OUTPATIENT_CLINIC_OR_DEPARTMENT_OTHER): Payer: Medicare Other | Admitting: Adult Health

## 2016-06-09 ENCOUNTER — Encounter: Payer: Self-pay | Admitting: Adult Health

## 2016-06-09 VITALS — BP 164/91 | HR 115 | Temp 98.2°F | Resp 18 | Ht 65.0 in | Wt 314.3 lb

## 2016-06-09 DIAGNOSIS — Z86000 Personal history of in-situ neoplasm of breast: Secondary | ICD-10-CM

## 2016-06-09 DIAGNOSIS — N6489 Other specified disorders of breast: Secondary | ICD-10-CM

## 2016-06-09 DIAGNOSIS — C50411 Malignant neoplasm of upper-outer quadrant of right female breast: Secondary | ICD-10-CM

## 2016-06-09 DIAGNOSIS — Z170 Estrogen receptor positive status [ER+]: Principal | ICD-10-CM

## 2016-06-09 NOTE — Addendum Note (Signed)
Addended by: Scot Dock on: 06/09/2016 12:35 PM   Modules accepted: Orders
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
## 2016-06-09 NOTE — Telephone Encounter (Signed)
-----  Message from Gardenia Phlegm, NP sent at 06/09/2016 12:37 PM EDT ----- Please contact patient sometime later today and let her know I spoke with the physical therapist who thinks she would benefit from an evaluation.    Thanks,  Mendel Ryder

## 2016-06-09 NOTE — Telephone Encounter (Signed)
lvm re: Lindsey's attached message

## 2016-06-10 DIAGNOSIS — E1122 Type 2 diabetes mellitus with diabetic chronic kidney disease: Secondary | ICD-10-CM | POA: Diagnosis not present

## 2016-06-10 DIAGNOSIS — Z853 Personal history of malignant neoplasm of breast: Secondary | ICD-10-CM | POA: Diagnosis not present

## 2016-06-10 DIAGNOSIS — G4733 Obstructive sleep apnea (adult) (pediatric): Secondary | ICD-10-CM | POA: Diagnosis not present

## 2016-06-10 DIAGNOSIS — N183 Chronic kidney disease, stage 3 (moderate): Secondary | ICD-10-CM | POA: Diagnosis not present

## 2016-06-10 DIAGNOSIS — E1142 Type 2 diabetes mellitus with diabetic polyneuropathy: Secondary | ICD-10-CM | POA: Diagnosis not present

## 2016-06-10 DIAGNOSIS — E785 Hyperlipidemia, unspecified: Secondary | ICD-10-CM | POA: Diagnosis not present

## 2016-06-10 DIAGNOSIS — E039 Hypothyroidism, unspecified: Secondary | ICD-10-CM | POA: Diagnosis not present

## 2016-06-10 DIAGNOSIS — G219 Secondary parkinsonism, unspecified: Secondary | ICD-10-CM | POA: Diagnosis not present

## 2016-06-10 DIAGNOSIS — Z794 Long term (current) use of insulin: Secondary | ICD-10-CM | POA: Diagnosis not present

## 2016-06-10 DIAGNOSIS — G8929 Other chronic pain: Secondary | ICD-10-CM | POA: Diagnosis not present

## 2016-06-10 DIAGNOSIS — I129 Hypertensive chronic kidney disease with stage 1 through stage 4 chronic kidney disease, or unspecified chronic kidney disease: Secondary | ICD-10-CM | POA: Diagnosis not present

## 2016-06-10 DIAGNOSIS — J45909 Unspecified asthma, uncomplicated: Secondary | ICD-10-CM | POA: Diagnosis not present

## 2016-06-10 DIAGNOSIS — Z7982 Long term (current) use of aspirin: Secondary | ICD-10-CM | POA: Diagnosis not present

## 2016-06-10 DIAGNOSIS — M1990 Unspecified osteoarthritis, unspecified site: Secondary | ICD-10-CM | POA: Diagnosis not present

## 2016-06-10 DIAGNOSIS — Z9181 History of falling: Secondary | ICD-10-CM | POA: Diagnosis not present

## 2016-06-10 DIAGNOSIS — D649 Anemia, unspecified: Secondary | ICD-10-CM | POA: Diagnosis not present

## 2016-06-10 DIAGNOSIS — M545 Low back pain: Secondary | ICD-10-CM | POA: Diagnosis not present

## 2016-06-10 DIAGNOSIS — K219 Gastro-esophageal reflux disease without esophagitis: Secondary | ICD-10-CM | POA: Diagnosis not present

## 2016-06-11 ENCOUNTER — Ambulatory Visit (HOSPITAL_COMMUNITY)
Admission: RE | Admit: 2016-06-11 | Discharge: 2016-06-11 | Disposition: A | Discharge status: Home / Self Care | Payer: Medicare Other | Source: Ambulatory Visit | Attending: Neurology | Admitting: Neurology

## 2016-06-11 DIAGNOSIS — I129 Hypertensive chronic kidney disease with stage 1 through stage 4 chronic kidney disease, or unspecified chronic kidney disease: Secondary | ICD-10-CM | POA: Diagnosis not present

## 2016-06-11 DIAGNOSIS — M545 Low back pain: Secondary | ICD-10-CM | POA: Diagnosis not present

## 2016-06-11 DIAGNOSIS — G219 Secondary parkinsonism, unspecified: Secondary | ICD-10-CM | POA: Diagnosis not present

## 2016-06-11 DIAGNOSIS — N183 Chronic kidney disease, stage 3 (moderate): Secondary | ICD-10-CM | POA: Diagnosis not present

## 2016-06-11 DIAGNOSIS — Z8719 Personal history of other diseases of the digestive system: Secondary | ICD-10-CM | POA: Diagnosis not present

## 2016-06-11 DIAGNOSIS — E1142 Type 2 diabetes mellitus with diabetic polyneuropathy: Secondary | ICD-10-CM | POA: Insufficient documentation

## 2016-06-11 DIAGNOSIS — K219 Gastro-esophageal reflux disease without esophagitis: Secondary | ICD-10-CM | POA: Insufficient documentation

## 2016-06-11 DIAGNOSIS — J45909 Unspecified asthma, uncomplicated: Secondary | ICD-10-CM | POA: Insufficient documentation

## 2016-06-11 DIAGNOSIS — G4733 Obstructive sleep apnea (adult) (pediatric): Secondary | ICD-10-CM | POA: Diagnosis not present

## 2016-06-11 DIAGNOSIS — M25562 Pain in left knee: Secondary | ICD-10-CM | POA: Diagnosis not present

## 2016-06-11 DIAGNOSIS — E785 Hyperlipidemia, unspecified: Secondary | ICD-10-CM | POA: Insufficient documentation

## 2016-06-11 DIAGNOSIS — D649 Anemia, unspecified: Secondary | ICD-10-CM | POA: Insufficient documentation

## 2016-06-11 DIAGNOSIS — E1122 Type 2 diabetes mellitus with diabetic chronic kidney disease: Secondary | ICD-10-CM | POA: Diagnosis not present

## 2016-06-11 DIAGNOSIS — F419 Anxiety disorder, unspecified: Secondary | ICD-10-CM | POA: Diagnosis not present

## 2016-06-11 DIAGNOSIS — K449 Diaphragmatic hernia without obstruction or gangrene: Secondary | ICD-10-CM | POA: Diagnosis not present

## 2016-06-11 DIAGNOSIS — Z794 Long term (current) use of insulin: Secondary | ICD-10-CM | POA: Diagnosis not present

## 2016-06-11 DIAGNOSIS — R1312 Dysphagia, oropharyngeal phase: Secondary | ICD-10-CM | POA: Insufficient documentation

## 2016-06-11 DIAGNOSIS — M1990 Unspecified osteoarthritis, unspecified site: Secondary | ICD-10-CM | POA: Diagnosis not present

## 2016-06-11 DIAGNOSIS — Z96653 Presence of artificial knee joint, bilateral: Secondary | ICD-10-CM | POA: Insufficient documentation

## 2016-06-11 DIAGNOSIS — E669 Obesity, unspecified: Secondary | ICD-10-CM | POA: Insufficient documentation

## 2016-06-11 DIAGNOSIS — Z853 Personal history of malignant neoplasm of breast: Secondary | ICD-10-CM | POA: Insufficient documentation

## 2016-06-11 DIAGNOSIS — F209 Schizophrenia, unspecified: Secondary | ICD-10-CM | POA: Diagnosis not present

## 2016-06-11 DIAGNOSIS — H8110 Benign paroxysmal vertigo, unspecified ear: Secondary | ICD-10-CM | POA: Diagnosis not present

## 2016-06-11 DIAGNOSIS — F329 Major depressive disorder, single episode, unspecified: Secondary | ICD-10-CM | POA: Diagnosis not present

## 2016-06-11 DIAGNOSIS — R51 Headache: Secondary | ICD-10-CM | POA: Diagnosis not present

## 2016-06-11 DIAGNOSIS — M25561 Pain in right knee: Secondary | ICD-10-CM | POA: Insufficient documentation

## 2016-06-11 DIAGNOSIS — Z9181 History of falling: Secondary | ICD-10-CM | POA: Diagnosis not present

## 2016-06-11 DIAGNOSIS — E039 Hypothyroidism, unspecified: Secondary | ICD-10-CM | POA: Diagnosis not present

## 2016-06-11 DIAGNOSIS — Z7982 Long term (current) use of aspirin: Secondary | ICD-10-CM | POA: Diagnosis not present

## 2016-06-11 DIAGNOSIS — G8929 Other chronic pain: Secondary | ICD-10-CM | POA: Diagnosis not present

## 2016-06-12 ENCOUNTER — Ambulatory Visit: Payer: Medicare Other | Attending: Adult Health | Admitting: Physical Therapy

## 2016-06-12 VITALS — BP 155/92

## 2016-06-12 DIAGNOSIS — M25612 Stiffness of left shoulder, not elsewhere classified: Secondary | ICD-10-CM | POA: Diagnosis not present

## 2016-06-12 DIAGNOSIS — I890 Lymphedema, not elsewhere classified: Secondary | ICD-10-CM | POA: Insufficient documentation

## 2016-06-12 DIAGNOSIS — D649 Anemia, unspecified: Secondary | ICD-10-CM | POA: Diagnosis not present

## 2016-06-12 DIAGNOSIS — E039 Hypothyroidism, unspecified: Secondary | ICD-10-CM | POA: Diagnosis not present

## 2016-06-12 DIAGNOSIS — Z7982 Long term (current) use of aspirin: Secondary | ICD-10-CM | POA: Diagnosis not present

## 2016-06-12 DIAGNOSIS — E1122 Type 2 diabetes mellitus with diabetic chronic kidney disease: Secondary | ICD-10-CM | POA: Diagnosis not present

## 2016-06-12 DIAGNOSIS — K219 Gastro-esophageal reflux disease without esophagitis: Secondary | ICD-10-CM | POA: Diagnosis not present

## 2016-06-12 DIAGNOSIS — Z9181 History of falling: Secondary | ICD-10-CM | POA: Diagnosis not present

## 2016-06-12 DIAGNOSIS — Z794 Long term (current) use of insulin: Secondary | ICD-10-CM | POA: Diagnosis not present

## 2016-06-12 DIAGNOSIS — E785 Hyperlipidemia, unspecified: Secondary | ICD-10-CM | POA: Diagnosis not present

## 2016-06-12 DIAGNOSIS — N183 Chronic kidney disease, stage 3 (moderate): Secondary | ICD-10-CM | POA: Diagnosis not present

## 2016-06-12 DIAGNOSIS — M545 Low back pain: Secondary | ICD-10-CM | POA: Diagnosis not present

## 2016-06-12 DIAGNOSIS — M25611 Stiffness of right shoulder, not elsewhere classified: Secondary | ICD-10-CM | POA: Diagnosis not present

## 2016-06-12 DIAGNOSIS — J45909 Unspecified asthma, uncomplicated: Secondary | ICD-10-CM | POA: Diagnosis not present

## 2016-06-12 DIAGNOSIS — G219 Secondary parkinsonism, unspecified: Secondary | ICD-10-CM | POA: Diagnosis not present

## 2016-06-12 DIAGNOSIS — I129 Hypertensive chronic kidney disease with stage 1 through stage 4 chronic kidney disease, or unspecified chronic kidney disease: Secondary | ICD-10-CM | POA: Diagnosis not present

## 2016-06-12 DIAGNOSIS — Z853 Personal history of malignant neoplasm of breast: Secondary | ICD-10-CM | POA: Diagnosis not present

## 2016-06-12 DIAGNOSIS — M1990 Unspecified osteoarthritis, unspecified site: Secondary | ICD-10-CM | POA: Diagnosis not present

## 2016-06-12 DIAGNOSIS — G8929 Other chronic pain: Secondary | ICD-10-CM | POA: Diagnosis not present

## 2016-06-12 DIAGNOSIS — E1142 Type 2 diabetes mellitus with diabetic polyneuropathy: Secondary | ICD-10-CM | POA: Diagnosis not present

## 2016-06-12 DIAGNOSIS — G4733 Obstructive sleep apnea (adult) (pediatric): Secondary | ICD-10-CM | POA: Diagnosis not present

## 2016-06-12 NOTE — Therapy (Addendum)
[place], [place], [address] Phone: [phone]   Fax:  [phone]  Physical Therapy Evaluation  Patient Details  Name: [name] MRN: [id number] Date of Birth: 12-Sep-1955 Referring Provider: Suzan Garibaldi. Delice Bison, NP  Encounter Date: 06/12/2016      PT End of Session - 06/12/16 1628    Visit Number 1   Number of Visits 9   Date for PT Re-Evaluation 07/18/16   PT Start Time 4818   PT Stop Time 1616   PT Time Calculation (min) 45 min   Activity Tolerance Patient tolerated treatment well   Behavior During Therapy [facility] for tasks assessed/performed      Past Medical History:  Diagnosis Date  . Anemia   . Anxiety   . Arthritis    "pretty much all my joints"  . Asthma   . Benign paroxysmal positional vertigo 12/30/2012  . 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  . Breast mass in female    L breast 2008, US showed likely fat necrosis  . Chronic lower back pain   . CKD (chronic kidney disease), stage III    "lower stage" (01/06/2014)  . Daily headache   . Depression   . Gait abnormality 05/29/2016  . GERD (gastroesophageal reflux disease)   . History of blood transfusion 2011   "after one of my OR's"  . History of hiatal hernia   . History of stomach ulcers   . HSV (herpes simplex virus) infection   . Hx of radiation therapy 05/05/12- 07/15/12   right breast, 45 gray x 25 fx, lumpectomy cavity boosted to 16.2 gray  . Hyperlipemia   . Hypertension    sees Dr. Criss Rosales , [facility]  . Hypothyroidism   . Kidney stones   . Knee pain, bilateral   . Migraines    "~ 3 times/month" (01/06/2014)  . Obesity   . OSA on CPAP    pt does not know settings  . Pancreatitis    hx of  . Personal history of radiation therapy 02/2012  . Pneumonia 1950's  . Polyneuropathy in diabetes(357.2)   . Schizophrenia ([place])   . 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 Tachycardia    with sx in 2008  . Thyroid disease   . Type II diabetes mellitus ([place])     Past Surgical History:  Procedure Laterality Date  . ABDOMINAL HYSTERECTOMY  1979?   partial  . BREAST LUMPECTOMY  03/03/2012   Procedure: LUMPECTOMY;  Surgeon: Haywood Lasso, MD;  Location: [place];  Service: General;  Laterality: Right;  . BREAST SURGERY Right 01/2012   "cancer"  . CHOLECYSTECTOMY  1980's?  . COLONOSCOPY WITH PROPOFOL N/A 09/28/2012   Procedure: COLONOSCOPY WITH PROPOFOL;  Surgeon: Lear Ng, MD;  Location: [facility];  Service: Endoscopy;  Laterality: N/A;  . FOOT FRACTURE SURGERY Right 1990's  . JOINT REPLACEMENT    . REVISION TOTAL KNEE ARTHROPLASTY  07/2009  . SHOULDER OPEN ROTATOR CUFF REPAIR Left 1990's  . THYROIDECTOMY  1970's  . TOTAL KNEE ARTHROPLASTY Bilateral 2009-06/2009   left; right    Vitals:   06/12/16 1543  BP: (!) 155/92         Subjective Assessment - 06/12/16 1535    Subjective "I was feeling some kind of hard places in my breast where I had the surgery." "My arm up here (upper arm) is swollen."  She had been given foam pads here in the past to add compression to the breast when wearing her bra, but did not find them helpful.   Pertinent History (1)  status post right lumpectomy on 03/03/2012 for a 2.8 cm, grade 1 ductal carcinoma in situ, estrogen receptor 100% positive, progesterone receptor 100% positive, with clear margins.DM and HTN; (2)  completed radiation therapy under the care of Dr. Sondra Come 07/15/2012.  She does not currently have her HTN meds. Fell in November and sustained a hairline fracture in some UE bone so she is wearing a velcro wrist brace currently. Is on Lyrica for neuropathy from diabetes. h/o left RTC repair in about 2000.   Patient Stated Goals get hard places to reduce   Currently in Pain? 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 Causey, NP   Hand Dominance Right   Prior Therapy yes--[facility]; was seen here previously for MLD and self-MLD instruction     Precautions   Precautions Fall   Precaution Comments breast cancer history     Restrictions   Weight Bearing Restrictions No     Balance Screen   Has the patient fallen in the past 6 months --  forgot to ask this today     [place] residence   [place] to enter   Entrance Stairs-Number of Steps 1   [place] One level     Prior Function   Level of Independence Needs assistance with ADLs;Needs assistance with homemaking  aide 3 hrs/day 7 days/week for ADLs and housework   Vocation On disability   Leisure PT comes to the house to do exercise     Cognition   Overall Cognitive Status Within Functional Limits for tasks assessed     Observation/Other Assessments   Observations Pt. moans in apparent discomfort during the initial part of the evaluation  She reports she is wearing a sports bra; although it was not closely evaluated, it does not appear to be compressive, which would benefit her.   Other Surveys  --  lymph life impact scale score of 21 = 31% impairment     Posture/Postural Control   Posture/Postural Control Postural limitations   Postural Limitations Flexed trunk  severely   Posture Comments appears unable to stand up straight     ROM / Strength   AROM / PROM / Strength AROM     AROM   Overall AROM Comments bilat. shoulder flexion to approx. 120 degrees; right shoulder abduction to about 140 degree, and left to about 110 degrees     Palpation   Palpation comment  some greater firmness in right breast compared to left, especially around lumpectomy incision at lateral breast     Ambulation/Gait   Ambulation/Gait Yes   Ambulation/Gait Assistance 6: Modified independent (Device/Increase time)   Assistive device Rolling walker;Rollator  FWW at home, rollator in [place] - 06/12/16 1552  Type   Cancer Type right breast DCIS     Surgeries   Lumpectomy Date 03/03/12   Number Lymph Nodes Removed 0     Treatment   Past Chemotherapy Treatment No   Past Radiation Treatment Yes   Date 07/15/12     Lymphedema Assessments   Lymphedema Assessments Upper extremities     Right Upper Extremity Lymphedema   15 cm Proximal to Olecranon Process 48.5 cm   10 cm Proximal to Olecranon Process 46.2 cm   Olecranon Process 31.2 cm   10 cm Proximal to Ulnar Styloid Process 24.9 cm   Just Proximal to Ulnar Styloid Process 19.3 cm   Across Hand at [employer] 21.6 cm   At [place] of 2nd Digit 7 cm   Other around base of breast, 53     Left Upper Extremity Lymphedema   15 cm Proximal to Olecranon Process 45.9 cm   10 cm Proximal to Olecranon Process 43.5 cm   Olecranon Process 31.3 cm   10 cm Proximal to Ulnar Styloid Process 25.8 cm   Just Proximal to Ulnar Styloid Process 20.2 cm   Across Hand at [employer] 20.8 cm   At [place] of 2nd Digit 7 cm   Other around base of breast, [place] [facility] Goals - 06/12/16 1637      CC Long Term Goal  #1   Title Patient will be independent in self-manual lymph drainage for right breast and right UE.   Time 4   Period Weeks   Status New     CC Long Term Goal  #2   Title Patient will report at least 40% improvement in perception of hard spots and discomfort in right breast.   Time 4   Period Weeks   Status New     CC Long Term Goal  #3   Title Pt. will be knowledgeable about compression garments  to assist with management of lymphedema (bras and possibly a sleeve).   Time 4   Period Weeks   Status New     CC Long Term Goal  #4   Title lymphedema life impact scale score reduced to impairment rating of 20% or less   Baseline score of 21 = 31% impairment at eval   Time 4   Period Weeks   Status New            Plan - 06/12/16 1629    Clinical Impression Statement Patient is known to this clinic from a past episode of care for her breast lymphedema. She had right breast DCIS, no lymph nodes removed but radiation therapy.  She feels her breast has hard areas; palpation today shows slight firmness in right breast compared to left, particularly around her lumpectomy incision at lateral aspect.  She does have larger cirumference measurements at right upper arm than left.  She has chronic back pain, can't stand up straight from that, and walks with a walker.  She has DM and HTN; she has had a problem getting her HTN meds refilled recently so has not taken them, and BP was a bit high today.  She has had recent hospitalizations for pneumonia that she says resulted from aspiration because of swallowing problems.  Eval is moderate complexity due to these comorbidities, and evolving with  recent health complications including hospitalizations that have prevented her from performing her self-care that she learned earlier for lymphedema. She also has limited shoulder A/ROM bilaterally; the left has a h/o rotator cuff repair.   Rehab Potential Fair   PT Frequency 2x / week   PT Duration 4 weeks  2-4 weeks   PT Treatment/Interventions ADLs/Self Care Home Management;DME Instruction;Therapeutic exercise;Patient/family education;Orthotic Fit/Training;Manual techniques;Manual lymph drainage;Compression bandaging;Scar mobilization;Passive range of motion;Taping   PT Next Visit Plan Ask about fall history.  Begin manual lymph drainage for right breast and UE, as well as instruction in same   Consulted and  Agree with Plan of Care Patient      Patient will benefit from skilled therapeutic intervention in order to improve the following deficits and impairments:  Increased edema, Decreased knowledge of precautions, Decreased knowledge of use of DME  Visit Diagnosis: Lymphedema, not elsewhere classified - Plan: PT plan of care cert/re-cert  Stiffness of right shoulder, not elsewhere classified - Plan: PT plan of care cert/re-cert  Stiffness of left shoulder, not elsewhere classified - Plan: PT plan of care cert/re-cert      G-Codes - 31/54/00 1640    Functional Assessment Tool Used (Outpatient Only) lymphedema life impact scale   Functional Limitation Self care   Self Care Current Status (Q6761) At least 20 percent but less than 40 percent impaired, limited or restricted   Self Care Goal Status (P5093) At least 1 percent but less than 20 percent impaired, limited or restricted       Problem List Patient Active Problem List   Diagnosis Date Noted  . Gait abnormality 05/29/2016  . Encephalopathy acute 04/23/2016  . Hyponatremia 04/23/2016  . Schizophrenia, paranoid ([place])   . Aspiration pneumonia of left lower lobe due to gastric secretions ([place])   . Projectile vomiting with nausea   . Altered mental status   . Schizophrenia ([place])   . Disorganized schizophrenia ([place])   . Chronic pain syndrome   . Acute renal failure ([place])   . Controlled diabetes mellitus type 2 with complications ([place])   . Acute encephalopathy   . Oropharyngeal dysphagia   . Aspiration pneumonia of right lower lobe due to vomit ([place])   . Acute renal failure (ARF) ([place]) 03/28/2016  . Encephalopathy 03/27/2016  . HCAP (healthcare-associated pneumonia) 02/29/2016  . Pneumonia 02/14/2016  . CAP (community acquired pneumonia) 02/13/2016  . Edema 02/13/2016  . AKI (acute kidney injury) ([place]) 02/13/2016  . Elevated troponin 06/24/2015  . Diarrhea 06/23/2015  . Sepsis ([place]) 06/23/2015  . Obesity, morbid, BMI 50 or  higher ([place]) 03/26/2015  . Post traumatic stress disorder (PTSD) 11/14/2014  . Leukocytosis 11/12/2014  . Slurred speech 11/12/2014  . CKD (chronic kidney disease) stage 3, GFR 30-59 ml/min   . Diabetes mellitus, type II, insulin dependent ([place])   . TIA (transient ischemic attack)   . Hypotension 10/28/2014  . Syncope 10/28/2014  . Anemia 10/28/2014  . Type II diabetes mellitus ([place]) 10/28/2014  . OSA on CPAP 10/28/2014  . Chronic lower back pain 10/28/2014  . Polyneuropathy in diabetes(357.2) 10/28/2014  . Diabetic polyneuropathy ([place])   . Fall 08/14/2014  . Recurrent falls 08/14/2014  . Hot flashes 02/24/2014  . Arthralgia 02/24/2014  . Hair loss 02/24/2014  . Chest pain 01/06/2014  . Shock ([place]) 08/08/2013  . 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 Dysarthria 12/30/2012  . Benign paroxysmal positional vertigo 12/30/2012  . Breast cancer of upper-outer quadrant of right female breast ([place]) 11/18/2012  .  Hemorrhage of rectum and anus 09/28/2012  . Hx of radiation therapy   . Syncope and collapse 06/04/2011  . HSV 10/05/2008  . Asthma 06/02/2008  . HLD (hyperlipidemia) 06/02/2006  . Sinus tachycardia 05/25/2006  . Hypothyroidism 02/27/2006  . Obesity 02/27/2006  . Depression 02/27/2006  . Essential hypertension 02/27/2006  . GERD 02/27/2006  . KNEE PAIN 02/27/2006    SALISBURY,DONNA 06/12/2016, 4:49 PM  [place], [place], [address] Phone: [phone]   Fax:  [phone]  Name: ANAM BOBBY MRN: [id number] Date of Birth: 30-Aug-1955  Serafina Royals, PT 06/12/16 4:49 PM

## 2016-06-17 ENCOUNTER — Telehealth: Payer: Self-pay | Admitting: Physical Therapy

## 2016-06-17 ENCOUNTER — Telehealth: Payer: Self-pay | Admitting: Neurology

## 2016-06-17 NOTE — Telephone Encounter (Signed)
Pt said she was sent to [facility] hospital on 4-26 for testing and wants a call back with what Dr Jannifer Franklin will do as a result of the testing that was done

## 2016-06-17 NOTE — Telephone Encounter (Signed)
I called patient. The modified van swallow showed mild oral pharyngeal dysphasia. The patient was to take small bites, clear the throat and mouth before the next bite. No alteration to the diet was recommended.

## 2016-06-18 ENCOUNTER — Ambulatory Visit: Payer: Medicare Other | Admitting: Physical Therapy

## 2016-06-20 ENCOUNTER — Encounter: Payer: Self-pay | Admitting: Physical Therapy

## 2016-06-23 ENCOUNTER — Encounter: Payer: Self-pay | Admitting: Physical Therapy

## 2016-06-25 ENCOUNTER — Encounter: Payer: Self-pay | Admitting: Physical Therapy

## 2016-06-30 ENCOUNTER — Encounter: Payer: Self-pay | Admitting: Neurology

## 2016-06-30 ENCOUNTER — Ambulatory Visit (INDEPENDENT_AMBULATORY_CARE_PROVIDER_SITE_OTHER): Payer: Medicare Other | Admitting: Neurology

## 2016-06-30 VITALS — BP 128/60 | HR 92 | Resp 20 | Ht 65.0 in | Wt 309.0 lb

## 2016-06-30 DIAGNOSIS — R519 Headache, unspecified: Secondary | ICD-10-CM

## 2016-06-30 DIAGNOSIS — R51 Headache: Secondary | ICD-10-CM | POA: Diagnosis not present

## 2016-06-30 DIAGNOSIS — R635 Abnormal weight gain: Secondary | ICD-10-CM | POA: Diagnosis not present

## 2016-06-30 DIAGNOSIS — R351 Nocturia: Secondary | ICD-10-CM

## 2016-06-30 DIAGNOSIS — G4733 Obstructive sleep apnea (adult) (pediatric): Secondary | ICD-10-CM

## 2016-06-30 NOTE — Progress Notes (Signed)
Subjective:    Patient ID: [name] is a 61 y.o. female.  HPI     Star Age, MD, PhD [facility] [address], [address] [place], [place] [address]  Dear Lanny Hurst,   I saw your patient, [name], upon your kind request in my clinic today for initial consultation of her sleep disorder, in particular, concern for underlying obstructive sleep apnea. The patient is unaccompanied today. As you know, Mr. Juanita Craver is a 61 year old right-handed woman with a complex medical history of breast cancer, chronic low back pain, mood disorder, reflux disease, anemia, arthritis, hyperlipidemia, hypertension, hypothyroidism, kidney stones, migraine headaches, history of pancreatitis, diabetes and morbid obesity with a BMI of over 50, who was previously diagnosed with obstructive sleep apnea and placed on CPAP therapy. Her sleep study may have been as long as 20 years ago, her current machine is at least 60 years old. Prior  sleep study results are not available for my review today. A CPAP compliance download is not available for my review today. I reviewed your office note from 05/29/2016. Her Epworth sleepiness score is 8 out of 24, her fatigue score is 55 out of 63. She is divorced, on disability. She lives alone. She has 2 grown children. She quit smoking in 1996 and currently does not drink any alcohol. She does not drink caffeine on a daily basis. She does not keep a very set schedule for her sleep and wake time, typically in bed around 1 AM, wake up time around 8 AM. She has nocturia about twice per average night. She wakes up with a headache occasionally. Her CPAP machine as effective she reports. She did not bring her machine today. She came via transportation bus and is in a hurry to leave. I could not complete a full exam. She believes her DME company is [facility].  Her Past Medical History Is Significant For: Past Medical History:  Diagnosis Date  .  Anemia   . Anxiety   . Arthritis    "pretty much all my joints"  . Asthma   . Benign paroxysmal positional vertigo 12/30/2012  . Breast cancer ([place]) 02/13/12   ruq  100'clock bx Ductal Carcinoma in Situ,(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  . Breast mass in female    L breast 2008, US showed likely fat necrosis  . Chronic lower back pain   . CKD (chronic kidney disease), stage III    "lower stage" (01/06/2014)  . Daily headache   . Depression   . Gait abnormality 05/29/2016  . GERD (gastroesophageal reflux disease)   . History of blood transfusion 2011   "after one of my OR's"  . History of hiatal hernia   . History of stomach ulcers   . HSV (herpes simplex virus) infection   . Hx of radiation therapy 05/05/12- 07/15/12   right breast, 45 gray x 25 fx, lumpectomy cavity boosted to 16.2 gray  . Hyperlipemia   . Hypertension    sees Dr. Criss Rosales , [facility]  . Hypothyroidism   . Kidney stones   . Knee pain, bilateral   . Migraines    "~ 3 times/month" (01/06/2014)  . Obesity   . OSA on CPAP    pt does not know settings  . Pancreatitis    hx of  . Personal history of radiation therapy 02/2012  . Pneumonia 1950's  . Polyneuropathy in diabetes(357.2)   . Schizophrenia ([place])   . 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  Tachycardia    with sx in 2008  . Thyroid disease   . Type II diabetes mellitus ([place])     Her Past Surgical History Is Significant For: Past Surgical History:  Procedure Laterality Date  . ABDOMINAL HYSTERECTOMY  1979?   partial  . BREAST LUMPECTOMY  03/03/2012   Procedure: LUMPECTOMY;  Surgeon: Haywood Lasso, MD;  Location: [facility];  Service: General;  Laterality: Right;  . BREAST SURGERY Right 01/2012   "cancer"  . CHOLECYSTECTOMY  1980's?  . COLONOSCOPY WITH PROPOFOL N/A 09/28/2012   Procedure: COLONOSCOPY WITH PROPOFOL;  Surgeon: Lear Ng, MD;  Location: [facility];  Service: Endoscopy;  Laterality: N/A;  . FOOT FRACTURE SURGERY Right 1990's  .  JOINT REPLACEMENT    . REVISION TOTAL KNEE ARTHROPLASTY  07/2009  . SHOULDER OPEN ROTATOR CUFF REPAIR Left 1990's  . THYROIDECTOMY  1970's  . TOTAL KNEE ARTHROPLASTY Bilateral 2009-06/2009   left; right    Her Family History Is Significant For: Family History  Problem Relation Age of Onset  . Heart disease Brother        Multiple MIs, starting in his 37s  . Diabetes Brother   . Hypertension Mother   . Diabetes Mother   . Breast cancer Mother 46  . Bone cancer Mother   . Hypertension Sister   . Diabetes Sister   . Breast cancer Sister 32  . Breast cancer Maternal Grandmother   . Heart disease Maternal Grandmother   . Uterine cancer Other 19  . Breast cancer Paternal Aunt 65  . Breast cancer Paternal Grandmother        dx in her 10s  . Prostate cancer Paternal Grandfather     Her Social History Is Significant For: Social History   Social History  . Marital status: Divorced    Spouse name: N/A  . Number of children: 2  . Years of education: 12   Occupational History  . Disabled    Social History Main Topics  . Smoking status: Former Smoker    Packs/day: 1.00    Years: 7.00    Types: Cigarettes    Quit date: 03/24/1992  . Smokeless tobacco: Never Used  . 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 Drug use: No  . Sexual activity: No   Other Topics Concern  . None   Social History Narrative   Single. Disabled Biochemist, clinical.          Patient is right handed.    Denies caffeine use     Her Allergies Are:  Allergies  Allergen Reactions  . Bee Venom Anaphylaxis  . Shellfish Allergy Anaphylaxis  . Buprenorphine Hcl Other (See Comments)    Overly sedated  :   Her Current Medications Are:  Outpatient Encounter Prescriptions as of 06/30/2016  Medication Sig  . acetaminophen (TYLENOL) 650 MG CR tablet Take 650 mg by mouth every 4 (four) hours. Per SO  . albuterol (PROAIR HFA) 108 (90 Base) MCG/ACT inhaler Inhale 2 puffs  into the lungs every 4 (four) hours as needed for wheezing or shortness of breath.   [name] aspirin EC 81 MG tablet Take 1 tablet (81 mg total) by mouth daily.  . B Complex-C (B-COMPLEX WITH VITAMIN C) tablet Take 1 tablet by mouth daily.  . Cholecalciferol (VITAMIN D-3) 1000 units CAPS Take 1,000 Units by mouth daily.  [name] esomeprazole (NEXIUM) 40 MG capsule Take 1 capsule (40  mg total) by mouth daily before breakfast.  . furosemide (LASIX) 40 MG tablet Take 1 tablet (40 mg total) by mouth daily.  . insulin aspart (NOVOLOG) 100 UNIT/ML injection Inject 0-9 Units into the skin 3 (three) times daily with meals. 0-9 Units, Subcutaneous CBG < 70: implement hypoglycemia protocol CBG 70 - 120: 0 units CBG 121 - 150: 1 unit CBG 151 - 200: 2 units CBG 201 - 250: 3 units CBG 251 - 300: 5 units CBG 301 - 350: 7 units CBG 351 - 400: 9 units CBG > 400: call MD  . Insulin Detemir (LEVEMIR FLEXTOUCH) 100 UNIT/ML Pen Inject 10 Units into the skin at bedtime.   [name] JANUMET 50-500 MG tablet Take 1 tablet by mouth daily.  [name] levothyroxine (SYNTHROID, LEVOTHROID) 150 MCG tablet Take 150 mcg by mouth daily before breakfast.  . linaclotide (LINZESS) 290 MCG CAPS capsule Take 290 mcg by mouth daily before breakfast.  . LORazepam (ATIVAN) 1 MG tablet Take 1-2 mg by mouth 2 (two) times daily. 26m in morning and 2 mg in the evening   . LYRICA 225 MG capsule Take 225 mg by mouth daily.  . Multiple Vitamin (MULTIVITAMIN WITH MINERALS) TABS tablet Take 1 tablet by mouth daily.  . rosuvastatin (CRESTOR) 20 MG tablet Take 20 mg by mouth daily.  .Marland KitchentiZANidine (ZANAFLEX) 2 MG tablet Take 2 mg by mouth.  . valsartan-hydrochlorothiazide (DIOVAN-HCT) 160-12.5 MG tablet Take 1 tablet by mouth.  . venlafaxine XR (EFFEXOR-XR) 150 MG 24 hr capsule Take 150 mg by mouth 2 (two) times daily.  . ziprasidone (GEODON) 20 MG capsule Take 1 capsule (20 mg total) by mouth 2 (two) times daily with a meal.  . [DISCONTINUED] albuterol (PROVENTIL)  (2.5 MG/3ML) 0.083% nebulizer solution Inhale 3 mLs into the lungs every 6 (six) hours as needed. For shortness of breath  . [DISCONTINUED] pregabalin (LYRICA) 200 MG capsule Take 1 capsule (200 mg total) by mouth daily. (Patient not taking: Reported on 06/12/2016)   No facility-administered encounter medications on file as of 06/30/2016.   :  Review of Systems:  Out of a complete 14 point review of systems, all are reviewed and negative with the exception of these symptoms as listed below:  Review of Systems  Neurological:       Patient states that her last sleep study was about 20 years ago. She currently uses CPAP, the machine it about [id number] old.   Patient has trouble falling and staying asleep, snores, witnessed apnea, wakes up tired, headaches, daytime fatigue, sometimes takes naps.    Epworth Sleepiness Scale 0= would never doze 1= slight chance of dozing 2= moderate chance of dozing 3= high chance of dozing  Sitting and reading:2 Watching TV:3 Sitting inactive in a public place (ex. Theater or meeting):1 As a passenger in a car for an hour without a break:0 Lying down to rest in the afternoon:2 Sitting and talking to someone:0 Sitting quietly after lunch (no alcohol):0 In a car, while stopped in traffic:0 Total:8  Objective:  Neurologic Exam  Physical Exam Physical Examination:   Vitals:   06/30/16 1525  BP: 128/60  Pulse: 92  Resp: 20   General Examination: The patient is a 61y.o. female in no acute distress. She appears well-developed and well-nourished and adequately groomed. She is in a hurry, has [name] coming.   HEENT: Normocephalic, atraumatic, pupils are equal, round and reactive to light and accommodation. Extraocular tracking is fair. Hearing is grossly intact. Face  is symmetric with normal facial animation and normal facial sensation. Speech is clear with no dysarthria noted. There is no hypophonia. There is no lip, neck/head, jaw or voice tremor.  Neck is supple with full range of passive and active motion. There are no carotid bruits on auscultation. Oropharynx exam reveals: moderate mouth dryness, marginal dental hygiene and marked airway crowding, due to large tongue, thicker soft palate, small airway opening, tonsils are not. Visualized, utilized not fully visualized. Mallampati is class III. Tongue protrudes centrally and palate elevates symmetrically. Neck size is 18 1/8 inches.    Chest: Clear to auscultation without wheezing, rhonchi or crackles noted.  Heart: S1+S2+0, regular and normal without murmurs, rubs or gallops noted.   Abdomen: Soft, non-tender and non-distended with normal bowel sounds appreciated on auscultation.  Extremities: There is non pitting puffiness in the distal lower extremities bilaterally.  Skin: Warm and dry without trophic changes noted.  Musculoskeletal: exam reveals no obvious joint deformities, tenderness or joint swelling or erythema.   Neurologically:  Mental status: The patient is awake, alert and oriented in all 4 spheres. Her immediate and remote memory, attention, language skills and fund of knowledge are appropriate. There is no evidence of aphasia, agnosia, apraxia or anomia. Speech is clear with normal prosody and enunciation.  Mood is decreased range and affect is blunted.  Cranial nerves II - XII are as described above under HEENT exam. In addition: shoulder shrug is normal with equal shoulder height noted. Motor exam: not fully tested. Patient had to leave to catch the bus. She stood with mild difficulty. Using her rolling walker with seat, she walked slowly.  Sensory exam: not able to do beyond LT.  No obvious cerebellar signs.   Assessment and Plan:  In summary, MARGARETTE VANNATTER is a very pleasant 61 y.o.-year old female with a complex medical history of breast cancer, chronic low back pain, mood disorder, reflux disease, anemia, arthritis, hyperlipidemia, hypertension, hypothyroidism,  kidney stones, migraine headaches, history of pancreatitis, diabetes and morbid obesity with a BMI of over 50, who was diagnosed with obstructive sleep apnea (OSA). Her sleep study was years ago and she has an older machine, which is now defective. She will need reevaluation. I recommended the following at this time: sleep study with potential positive airway pressure titration. (We will score hypopneas at 4%). Patient had to leave before I had a chance to explain the sleep study procedure or the risks of untreated OSA to her. She did not stay long enough for a discussion regarding alternative treatments to CPAP. Nevertheless, she is willing to return for sleep study testing and to continue with CPAP.  Thank you very much for allowing me to participate in the care of this nice patient. If I can be of any further assistance to you please do not hesitate to talk to me.  Sincerely,   Star Age, MD, PhD

## 2016-06-30 NOTE — Patient Instructions (Signed)
Based on your symptoms and your exam I believe you are still at risk for obstructive sleep apnea or OSA, and I think we should proceed with a sleep study to determine how severe it is. If you have more than mild OSA, I want you to consider treatment with CPAP. Please remember, the risks and ramifications of moderate to severe obstructive sleep apnea or OSA are: Cardiovascular disease, including congestive heart failure, stroke, difficult to control hypertension, arrhythmias, and even type 2 diabetes has been linked to untreated OSA. Sleep apnea causes disruption of sleep and sleep deprivation in most cases, which, in turn, can cause recurrent headaches, problems with memory, mood, concentration, focus, and vigilance. Most people with untreated sleep apnea report excessive daytime sleepiness, which can affect their ability to drive. Please do not drive if you feel sleepy.   I will likely see you back after your sleep study to go over the test results and where to go from there. We will call you after your sleep study to advise about the results (most likely, you will hear from Beverlee Nims, my nurse) and to set up an appointment at the time, as necessary.    Our sleep lab administrative assistant, Arrie Aran will meet with you or call you to schedule your sleep study. If you don't hear back from her by next week please feel free to call her at [phone]. This is her direct line and please leave a message with your phone number to call back if you get the voicemail box. She will call back as soon as possible.

## 2016-07-01 ENCOUNTER — Encounter: Payer: Self-pay | Admitting: Physical Therapy

## 2016-07-02 ENCOUNTER — Encounter: Payer: Self-pay | Admitting: Physical Therapy

## 2016-07-07 ENCOUNTER — Encounter: Payer: Self-pay | Admitting: Physical Therapy

## 2016-07-07 DIAGNOSIS — F419 Anxiety disorder, unspecified: Secondary | ICD-10-CM | POA: Insufficient documentation

## 2016-07-07 DIAGNOSIS — Z853 Personal history of malignant neoplasm of breast: Secondary | ICD-10-CM | POA: Insufficient documentation

## 2016-07-07 DIAGNOSIS — R933 Abnormal findings on diagnostic imaging of other parts of digestive tract: Secondary | ICD-10-CM | POA: Insufficient documentation

## 2016-07-09 ENCOUNTER — Encounter: Payer: Self-pay | Admitting: Physical Therapy

## 2016-07-11 ENCOUNTER — Ambulatory Visit (INDEPENDENT_AMBULATORY_CARE_PROVIDER_SITE_OTHER): Payer: Medicare Other | Admitting: Neurology

## 2016-07-11 DIAGNOSIS — G4733 Obstructive sleep apnea (adult) (pediatric): Secondary | ICD-10-CM

## 2016-07-11 DIAGNOSIS — G4720 Circadian rhythm sleep disorder, unspecified type: Secondary | ICD-10-CM

## 2016-07-15 ENCOUNTER — Encounter: Payer: Self-pay | Admitting: Physical Therapy

## 2016-07-16 ENCOUNTER — Telehealth: Payer: Self-pay | Admitting: Internal Medicine

## 2016-07-16 NOTE — Telephone Encounter (Signed)
[facility] called to advise that the office just received a call however, she is not sure who called, there is no note in the system.   [name] from [place] stated that their office is still waiting on labs from the previous provider and once they receive them they will contact Endocrinology.   Please advise as necessary.

## 2016-07-17 ENCOUNTER — Telehealth: Payer: Self-pay

## 2016-07-17 ENCOUNTER — Encounter: Payer: Self-pay | Admitting: Physical Therapy

## 2016-07-17 NOTE — Telephone Encounter (Signed)
I spoke to patient and she is aware of results and recommendations. She is willing to start treatment. I have sent orders to [facility]. I will send report to PCP. Patient was able to make f/u appt.

## 2016-07-17 NOTE — Procedures (Signed)
PATIENT'S NAME:  [name], [name] DOB:      24-Aug-1955      MR#:    [id number]     DATE OF RECORDING: 07/11/2016 REFERRING M.D.: Dr. Margette Fast, PCP: [name] MD Study Performed:  Split-Night Titration Study HISTORY: 61 year old right-handed woman with a complex medical history of breast cancer, chronic low back pain, mood disorder, reflux disease, anemia, arthritis, hyperlipidemia, hypertension, hypothyroidism, kidney stones, migraine headaches, history of pancreatitis, diabetes and morbid obesity with a BMI of over 71, who was previously diagnosed with obstructive sleep apnea and placed on CPAP therapy. Her sleep study may have been as long as 20 years ago, her current machine is at least 60 years old. The patient endorsed the Epworth Sleepiness Scale at 8 points. The patient's weight 309 pounds with a height of 65 (inches), resulting in a BMI of 51.4 kg/m2. The patient's neck circumference measured 18.1 inches.  CURRENT MEDICATIONS: Tylenol, ProAir, Aspirin, B Complex w/Vitamin C, Vitamin D3, Nexium, Lasix, Novalog, Levemir, Janumet, Synthroid, Linzess, Ativan, Lyrica, Multivitamin, Crestor, Zanaflex, Diovan-HCT, Effexor-XR, Geodon   PROCEDURE:  This is a multichannel digital polysomnogram utilizing the Somnostar 11.2 system.  Electrodes and sensors were applied and monitored per AASM Specifications.   EEG, EOG, Chin and Limb EMG, were sampled at 200 Hz.  ECG, Snore and Nasal Pressure, Thermal Airflow, Respiratory Effort, CPAP Flow and Pressure, Oximetry was sampled at 50 Hz. Digital video and audio were recorded.      BASELINE STUDY WITHOUT CPAP RESULTS:  Lights Out was at 20:49 and Lights On at 04:59 for the night, split study start at 23:39, epoch 348. Total recording time (TRT) was 168.5, with a total sleep time (TST) of 134 minutes.  The patient's sleep latency was 24 minutes. REM sleep was absent. The sleep efficiency was 79.5 %.    SLEEP ARCHITECTURE: WASO (Wake after sleep onset)  was 16 minutes, Stage N1 was 9 minutes, Stage N2 was 125 minutes, Stage N3 was 0 minutes and Stage R (REM sleep) was 0 minutes.  The percentages were Stage N1 6.7%, Stage N2 93.3%, which is highly increased, Stage N3 and Stage R (REM sleep) were absent.  The arousals were noted as: 49 were spontaneous, 0 were associated with PLMs, 90 were associated with respiratory events.   Audio and video analysis did not show any abnormal or unusual movements, behaviors, phonations or vocalizations.  The patient took 1 bathroom break for the night. Moderate to loud snoring was noted. The EKG was in keeping with normal sinus rhythm (NSR).   RESPIRATORY ANALYSIS:  There were a total of 103 respiratory events:  19 obstructive apneas, 0 central apneas and 41 mixed apneas with a total of 60 apneas and an apnea index (AI) of 26.9. There were 43 hypopneas with a hypopnea index of 19.3. The patient also had 0 respiratory event related arousals (RERAs).  Snoring was noted.     The total APNEA/HYPOPNEA INDEX (AHI) was 46.1 /hour and the total RESPIRATORY DISTURBANCE INDEX was 46.1 /hour.  0 events occurred in REM sleep and 127 events in NREM. The REM AHI was 0, /hour versus a non-REM AHI of 46.1 /hour. The patient spent 440 minutes sleep time in the supine position 0 minutes in non-supine. The supine AHI was 46.2 /hour versus a non-supine AHI of 0.0 /hour.  OXYGEN SATURATION & C02:  The wake baseline 02 saturation was 89%, with the lowest being 81%. Time spent below 89% saturation equaled 88 minutes.  PERIODIC LIMB  MOVEMENTS:  The patient had a total of 0 Periodic Limb Movements.  The Periodic Limb Movement (PLM) index was 0 /hour and the PLM Arousal index was 0 /hour.  TITRATION STUDY WITH CPAP RESULTS:   The patient brought her own Swift LT nasal pillows. CPAP was initiated at 5 cmH20 with heated humidity per AASM split night standards and pressure was advanced to 10 cmH20 because of hypopneas, apneas and desaturations.   At a PAP pressure of 10 cmH20, ere was a reduction of the AHI to 10.9/hour, with supine sleep achieved, O2 nadir of 92%.   Total recording time (TRT) was 321.5 minutes, with a total sleep time (TST) of 306 minutes. The patient's sleep latency was 9.5 minutes. REM latency was 59.5 minutes.  The sleep efficiency was 95.2 %.    SLEEP ARCHITECTURE: Wake after sleep was 8 minutes, Stage N1 11 minutes, Stage N2 266.5 minutes, Stage N3 0 minutes and Stage R (REM sleep) 28.5 minutes. The percentages were: Stage N1 3.6%, Stage N2 87.1%, Stage N3 absent, and Stage R (REM sleep) 9.3%.  The arousals were noted as: 27 were spontaneous, 0 were associated with PLMs, 9 were associated with respiratory events.  RESPIRATORY ANALYSIS:  There were a total of 67 respiratory events: 64 obstructive apneas, 0 central apneas and 1 mixed apneas with a total of 65 apneas and an apnea index (AI) of 12.7. There were 2 hypopneas with a hypopnea index of .4 /hour. The patient also had 0 respiratory event related arousals (RERAs).      The total APNEA/HYPOPNEA INDEX  (AHI) was 13.1 /hour and the total RESPIRATORY DISTURBANCE INDEX was 13.1 /hour.  13 events occurred in REM sleep and 54 events in NREM. The REM AHI was 27.4 /hour versus a non-REM AHI of 11.7 /hour. REM sleep was achieved on a pressure of  cm/h2o (AHI was  .) The patient spent 100% of total sleep time in the supine position. The supine AHI was 13.1 /hour, versus a non-supine AHI of 0.0/hour.  OXYGEN SATURATION & C02:  The wake baseline 02 saturation was 94%, with the lowest being 84%. Time spent below 89% saturation equaled 10 minutes.  PERIODIC LIMB MOVEMENTS:    The patient had a total of 0 Periodic Limb Movements. The Periodic Limb Movement (PLM) index was 0 /hour and the PLM Arousal index was 0 /hour.   Post-study, the patient indicated that sleep was better than usual.  POLYSOMNOGRAPHY IMPRESSION :   1. Obstructive Sleep Apnea (OSA)  2. Dysfunctions  associated with Sleep stages or arousal from Sleep  RECOMMENDATIONS:  1. This patient has severe obstructive sleep apnea and responded reasonably well on CPAP therapy. Due to residual AHI of 10.9/hour on the final pressure of 10 cm, I will start the patient on home CPAP treatment at a pressure of 11 cm via nasal pillows. The patient should be reminded to be fully compliant with PAP therapy to improve sleep related symptoms and decrease long term cardiovascular risks. Please note that untreated obstructive sleep apnea carries additional perioperative morbidity. Patients with significant obstructive sleep apnea should receive perioperative PAP therapy and the surgeons and particularly the anesthesiologist should be informed of the diagnosis and the severity of the sleep disordered breathing. Weight loss should be strongly encouraged.  2. This study shows sleep fragmentation and abnormal sleep stage percentages; these are nonspecific findings and per se do not signify an intrinsic sleep disorder or a cause for the patient's sleep-related symptoms. Causes include (but  are not limited to) the first night effect of the sleep study, circadian rhythm disturbances, medication effect or an underlying mood disorder or medical problem.  3. The patient should be cautioned not to drive, work at heights, or operate dangerous or heavy equipment when tired or sleepy. Review and reiteration of good sleep hygiene measures should be pursued with any patient. 4. The patient will be seen in follow-up by Dr. Rexene Alberts at [facility] for discussion of the test results and further management strategies. The referring provider will be notified of the test results.  I certify that I have reviewed the entire raw data recording prior to the issuance of this report in accordance with the Standards of Accreditation of the American Academy of Sleep Medicine (AASM)   Star Age, MD, PhD Diplomat, American Board of Psychiatry and Neurology  (Neurology and Sleep Medicine)

## 2016-07-17 NOTE — Addendum Note (Signed)
Addended by: Star Age on: 07/17/2016 08:21 AM   Modules accepted: Orders

## 2016-07-17 NOTE — Progress Notes (Signed)
[name]:  Patient referred by Dr. Jannifer Franklin, seen by me on 06/30/16, split study on 07/11/16. Please call and notify patient that the recent sleep study confirmed the diagnosis of severe OSA. She did well with CPAP during the study with improvement of the respiratory events. Therefore, I would like to prescribe a new CPAP machine for her. She may already have an existing DME. I placed the order in the chart. The patient will need a follow up appointment with me in 10 weeks post set up that has to be scheduled; schedule FU with NP if needed. please go ahead and schedule while you have the patient on the phone and make sure patient understands the importance of keeping this window for the FU appointment, as it is often an insurance requirement and failing to adhere to this may result in losing coverage for sleep apnea treatment.  Please re-enforce the importance of compliance with treatment and the need for [place] to monitor compliance data - again an insurance requirement and good feedback for the patient as far as how they are doing.  Also remind patient, that any upcoming CPAP machine or mask issues, should be first addressed with the DME company. Please ask if patient has a preference regarding DME company.  Please arrange for CPAP set up at home through a DME company of patient's choice - once you have spoken to the patient - and faxed/routed report to PCP and referring MD (if other than PCP), you can close this encounter, thanks,   Star Age, MD, PhD [facility] ([place])

## 2016-07-17 NOTE — Telephone Encounter (Signed)
-----  Message from Star Age, MD sent at 07/17/2016  8:21 AM EDT ----- [name]:  Patient referred by Dr. Jannifer Franklin, seen by me on 06/30/16, split study on 07/11/16. Please call and notify patient that the recent sleep study confirmed the diagnosis of severe OSA. She did well with CPAP during the study with improvement of the respiratory events. Therefore, I would like to prescribe a new CPAP machine for her. She may already have an existing DME. I placed the order in the chart. The patient will need a follow up appointment with me in 10 weeks post set up that has to be scheduled; schedule FU with NP if needed. please go ahead and schedule while you have the patient on the phone and make sure patient understands the importance of keeping this window for the FU appointment, as it is often an insurance requirement and failing to adhere to this may result in losing coverage for sleep apnea treatment.  Please re-enforce the importance of compliance with treatment and the need for [place] to monitor compliance data - again an insurance requirement and good feedback for the patient as far as how they are doing.  Also remind patient, that any upcoming CPAP machine or mask issues, should be first addressed with the DME company. Please ask if patient has a preference regarding DME company.  Please arrange for CPAP set up at home through a DME company of patient's choice - once you have spoken to the patient - and faxed/routed report to PCP and referring MD (if other than PCP), you can close this encounter, thanks,   Star Age, MD, PhD [facility] ([place])

## 2016-07-17 NOTE — Telephone Encounter (Signed)
I can't send labs orders, this goes to the doctor.

## 2016-07-22 ENCOUNTER — Telehealth (INDEPENDENT_AMBULATORY_CARE_PROVIDER_SITE_OTHER): Payer: Self-pay | Admitting: Orthopedic Surgery

## 2016-07-22 NOTE — Telephone Encounter (Signed)
Patient called needing to know what was the name of the hardware that was used for her right knee. Patient said her right knee is really swollen. She want to know the name when he did her first surgery. The number to contact patient is [phone]

## 2016-07-23 NOTE — Telephone Encounter (Signed)
Patient wanting to know what hardware/type of metal was used in her right knee. First surgery was done in 07/08/2007 with Depuy. 08/12/2007 she had polyrevision tibia RTKA for dislocated knee, and used hinge depuy. Had zimmer on left in 2011. I'm not sure what company uses what metal. Can you please advise? She declined appointment today and will call us back. She is unable to come in office due to other medical problems.

## 2016-07-23 NOTE — Telephone Encounter (Signed)
U call patient, Depew's on the right, Zimmer on the left, both components are chrome cobalt. This is the metal all total joints are made from.

## 2016-07-25 ENCOUNTER — Telehealth: Payer: Self-pay | Admitting: *Deleted

## 2016-07-25 ENCOUNTER — Other Ambulatory Visit: Payer: Self-pay | Admitting: *Deleted

## 2016-07-25 DIAGNOSIS — C50411 Malignant neoplasm of upper-outer quadrant of right female breast: Secondary | ICD-10-CM

## 2016-07-25 DIAGNOSIS — Z170 Estrogen receptor positive status [ER+]: Principal | ICD-10-CM

## 2016-07-25 DIAGNOSIS — N6489 Other specified disorders of breast: Secondary | ICD-10-CM

## 2016-07-25 NOTE — Telephone Encounter (Signed)
This RN returned call to pt per VM left in TRIAGE per concerns with ongoing lymphedema.  Franchelle states she needs a new referral to the lymphedema clinic due to " when I was seen by your office with this concern and referred I was still receiving PT thru [facility] and my insurance would not cover both services "   Romesha states areas of concern noted at prior visit is " a little worse ".  This RN contacted the lymphedema clinic and they can her Monday 6/11- new referral needs to be placed per prior states " closed ".  Order entered and Rose at [facility] will contact the patient.

## 2016-07-28 ENCOUNTER — Ambulatory Visit: Payer: Medicare Other | Attending: Adult Health | Admitting: Physical Therapy

## 2016-07-28 DIAGNOSIS — M6281 Muscle weakness (generalized): Secondary | ICD-10-CM | POA: Diagnosis present

## 2016-07-28 DIAGNOSIS — M25611 Stiffness of right shoulder, not elsewhere classified: Secondary | ICD-10-CM | POA: Diagnosis present

## 2016-07-28 DIAGNOSIS — I890 Lymphedema, not elsewhere classified: Secondary | ICD-10-CM | POA: Diagnosis present

## 2016-07-28 DIAGNOSIS — M25612 Stiffness of left shoulder, not elsewhere classified: Secondary | ICD-10-CM | POA: Diagnosis present

## 2016-07-28 NOTE — Therapy (Signed)
[place], [place], [address] Phone: [phone]   Fax:  [phone]  Physical Therapy Treatment  Patient Details  Name: [name] MRN: [id number] Date of Birth: December 06, 1955 Referring Provider: Magrinat  Encounter Date: 07/28/2016      PT End of Session - 07/28/16 1552    Visit Number 2   Number of Visits 10   Date for PT Re-Evaluation 08/25/16   PT Start Time 1302   PT Stop Time 1345   PT Time Calculation (min) 43 min   Activity Tolerance Patient tolerated treatment well   Behavior During Therapy [facility] for tasks assessed/performed      Past Medical History:  Diagnosis Date  . Anemia   . Anxiety   . Arthritis    "pretty much all my joints"  . Asthma   . Benign paroxysmal positional vertigo 12/30/2012  . 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  . Breast mass in female    L breast 2008, US showed likely fat necrosis  . Chronic lower back pain   . CKD (chronic kidney disease), stage III    "lower stage" (01/06/2014)  . Daily headache   . Depression   . Gait abnormality 05/29/2016  . GERD (gastroesophageal reflux disease)   . History of blood transfusion 2011   "after one of my OR's"  . History of hiatal hernia   . History of stomach ulcers   . HSV (herpes simplex virus) infection   . Hx of radiation therapy 05/05/12- 07/15/12   right breast, 45 gray x 25 fx, lumpectomy cavity boosted to 16.2 gray  . Hyperlipemia   . Hypertension    sees Dr. Criss Rosales , [facility]  . Hypothyroidism   . Kidney stones   . Knee pain, bilateral   . Migraines    "~ 3 times/month" (01/06/2014)  . Obesity   . OSA on CPAP    pt does not know settings  . Pancreatitis    hx of  . Personal history of radiation therapy 02/2012  . Pneumonia 1950's  . Polyneuropathy in diabetes(357.2)   . Schizophrenia ([place])   . 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 Tachycardia    with sx in 2008  . Thyroid disease   . Type II diabetes mellitus ([place])     Past Surgical History:  Procedure Laterality Date  . ABDOMINAL HYSTERECTOMY  1979?   partial  . BREAST LUMPECTOMY  03/03/2012   Procedure: LUMPECTOMY;  Surgeon: Haywood Lasso, MD;  Location: [facility];  Service: General;  Laterality: Right;  . BREAST SURGERY Right 01/2012   "cancer"  . CHOLECYSTECTOMY  1980's?  . COLONOSCOPY WITH PROPOFOL N/A 09/28/2012   Procedure: COLONOSCOPY WITH PROPOFOL;  Surgeon: Lear Ng, MD;  Location: [facility];  Service: Endoscopy;  Laterality: N/A;  . FOOT FRACTURE SURGERY Right 1990's  . JOINT REPLACEMENT    . REVISION TOTAL KNEE ARTHROPLASTY  07/2009  . SHOULDER OPEN ROTATOR CUFF REPAIR Left 1990's  . THYROIDECTOMY  1970's  . TOTAL KNEE ARTHROPLASTY Bilateral 2009-06/2009   left; right    There were no vitals filed for this visit.      Subjective Assessment - 07/28/16 1308    Subjective My swelling started last week. It is in my right breast and right arm. My swelling had been the same since April but recently got worse.    Pertinent History (  1)  status post right lumpectomy on 03/03/2012 for a 2.8 cm, grade 1 ductal carcinoma in situ, estrogen receptor 100% positive, progesterone receptor 100% positive, with clear margins.DM and HTN; (2)  completed radiation therapy under the care of Dr. Sondra Come 07/15/2012.  She does not currently have her HTN meds. Fell in November and sustained a hairline fracture in some UE bone so she is wearing a velcro wrist brace currently. Is on Lyrica for neuropathy from diabetes. h/o left RTC repair in about 2000.   Patient Stated Goals to be able to do self massage   Currently in Pain? 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 6  last fall at end of March   Has the patient had a decrease in activity level because of a fear of falling?  No   Is the patient reluctant to leave their home because of a fear of falling?  No     Home Environment   Living Environment Private residence   Living Arrangements Alone   Available Help at Discharge Personal care attendant  3 hrs/day- assist with bathing and washing hair, laundry, et   Type of [place] to enter   Entrance Stairs-Number of Steps [place] One level   [place] - 4 wheels;Walker - 2 wheels     Prior Function   Level of Independence Needs assistance with ADLs;Needs assistance with homemaking  aide 3 hrs/day 7 days/week for ADLs and housework   Vocation On disability   Leisure pt states she does leg exercises - a little     Cognition   Overall Cognitive Status Within Functional Limits for tasks assessed     Observation/Other Assessments   Observations pt groans in pain often, she is trying to come off of Lorazapam in order to be able to take pain medication again but reports during this time she sees things, hears things and feels like things are crawling on her- reports feeling very anxious   Other Surveys  --  LLIS: 46% impairment     Posture/Postural  Control   Posture/Postural Control Postural limitations   Postural Limitations Flexed trunk;Rounded Shoulders  severely   Posture Comments appears unable to stand up straight     ROM / Strength   AROM / PROM / Strength AROM;Strength     AROM   Overall  AROM Comments pt groans in pain during assessment- bilat. shoulder flexion to approx. 120 degrees; right shoulder abduction to about 140 degree, and left to about 110 degrees  pt reports she had surgery on left shoulder      Strength   Overall Strength Unable to assess;Due to pain  attempted to test strength of LE but pt in too much pain   Overall Strength Comments pt states she has 3 herniated discs in her back that cause pain with leg movement, unable to assess LE strength secondary to pain in LEs.      Palpation   Palpation comment some greater firmness in right breast compared to left, especially around lumpectomy incision at lateral breast     Ambulation/Gait   Ambulation/Gait Yes   Ambulation/Gait Assistance 6: Modified independent (Device/Increase time)   Assistive device Rolling walker;Rollator  FWW at home, rollator in community           LYMPHEDEMA/ONCOLOGY QUESTIONNAIRE - 07/28/16 1328      Type   Cancer Type right breast DCIS     Surgeries   Lumpectomy Date 03/03/12   Number Lymph Nodes Removed 0     Treatment   Past Chemotherapy Treatment No   Past Radiation Treatment Yes   Date 07/15/12     Lymphedema Assessments   Lymphedema Assessments Upper extremities     Right Upper Extremity Lymphedema   15 cm Proximal to Olecranon Process 49.6 cm   10 cm Proximal to Olecranon Process 46 cm   Olecranon Process 32.9 cm   10 cm Proximal to Ulnar Styloid Process 25.8 cm   Just Proximal to Ulnar Styloid Process 20 cm   Across Hand at [employer] 21.8 cm   At [place] of 2nd Digit 7.5 cm   Other around base of breast, 53     Left Upper Extremity Lymphedema   15 cm Proximal to Olecranon Process 47.4 cm   10 cm  Proximal to Olecranon Process 44.2 cm   Olecranon Process 32.9 cm   10 cm Proximal to Ulnar Styloid Process 25.5 cm   Just Proximal to Ulnar Styloid Process 20.1 cm   Across Hand at [employer] 21.1 cm   At [place] of 2nd Digit 8 cm   Other around base of breast, 30                          PT Education - 07/28/16 1351    Education provided Yes   Education Details lymphedema risk reduction practices, anatomy and physiology of lymphatic system, importance of compression garments   Methods Explanation;Handout   Comprehension Verbalized understanding                Long Term Clinic Goals - 07/28/16 1601      CC Long Term Goal  #1   Title Patient will be independent in self-manual lymph drainage for right breast and right UE.   Time 4   Period Weeks   Status New     CC Long Term Goal  #2   Title Patient will report at least 40% improvement in perception of hard spots and discomfort in right breast.   Time 4   Period Weeks   Status New     CC Long Term Goal  #3   Title Pt. will be knowledgeable about compression garments to assist with management of lymphedema (bras and possibly a sleeve).   Time 4  Period Weeks   Status New     CC Long Term Goal  #4   Title lymphedema life impact scale score reduced to impairment rating of 20% or less   Baseline score of 21 = 31% impairment at eval   Time 4   Period Weeks   Status New     CC Long Term Goal  #5   Title Pt will be independent in a home exercise program for LE strengthening and UE strenthening and ROM   Time 4   Period Weeks   Status New            Plan - August 14, 2016 1553    Clinical Impression Statement Reassessment performed today. Patient was seen in April 2018 but was received home health services at that time so no further treatments were scheduled. Patient has now completed home health physical therapy and she presents back at this clinic. She states she is having fullness in her right  breast breast and upper arm. Currently there is no significant differences in circumference but pt reports there are days where that shirt sleeve fits more tightly. Her breast is very tender to touch and she demostrates some fibrosis in area of lumpectomy scar. She is in a lot of pain. She reports she has three herniated discs in her back. Was unable to complete LE manual muscle testing due to pain. She has also had six falls in the past six months but has not fallen since the end of March. She would benefit from skilled PT services to manage her lymphedema which appears to be in the early stages and for a LE strengthening program and bilateral shoulder ROM.    Rehab Potential Good   Clinical Impairments Affecting Rehab Potential hx of radiation, severe pain due to herniated discs in back, pt reports she is having a hard time coming off her one of her prescriptions and sometimes she hears and sees things   PT Frequency 2x / week   PT Duration 4 weeks   PT Treatment/Interventions ADLs/Self Care Home Management;DME Instruction;Therapeutic exercise;Patient/family education;Orthotic Fit/Training;Manual techniques;Manual lymph drainage;Compression bandaging;Scar mobilization;Passive range of motion;Taping;Therapeutic activities   PT Next Visit Plan  Begin manual lymph drainage for right breast and UE, as well as instruction in same- eventually add LE strengthening exercises or encourage pt to continue exercises issued by home health   Consulted and Agree with Plan of Care Patient      Patient will benefit from skilled therapeutic intervention in order to improve the following deficits and impairments:  Increased edema, Decreased knowledge of precautions, Decreased knowledge of use of DME, Pain, Decreased range of motion, Decreased strength  Visit Diagnosis: Lymphedema, not elsewhere classified - Plan: PT plan of care cert/re-cert  Stiffness of right shoulder, not elsewhere classified - Plan: PT plan of  care cert/re-cert  Stiffness of left shoulder, not elsewhere classified - Plan: PT plan of care cert/re-cert  Muscle weakness (generalized) - Plan: PT plan of care cert/re-cert       G-Codes - 2016-08-14 1606    Functional Assessment Tool Used (Outpatient Only) lymphedema life impact scale   Functional Limitation Self care   Self Care Current Status (Y2233) At least 40 percent but less than 60 percent impaired, limited or restricted   Self Care Goal Status (K1224) At least 1 percent but less than 20 percent impaired, limited or restricted      Problem List Patient Active Problem List   Diagnosis Date Noted  . Gait abnormality  05/29/2016  . Encephalopathy acute 04/23/2016  . Hyponatremia 04/23/2016  . Schizophrenia, paranoid ([place])   . Aspiration pneumonia of left lower lobe due to gastric secretions ([place])   . Projectile vomiting with nausea   . Altered mental status   . Schizophrenia ([place])   . Disorganized schizophrenia ([place])   . Chronic pain syndrome   . Acute renal failure ([place])   . Controlled diabetes mellitus type 2 with complications ([place])   . Acute encephalopathy   . Oropharyngeal dysphagia   . Aspiration pneumonia of right lower lobe due to vomit ([place])   . Acute renal failure (ARF) ([place]) 03/28/2016  . Encephalopathy 03/27/2016  . HCAP (healthcare-associated pneumonia) 02/29/2016  . Pneumonia 02/14/2016  . CAP (community acquired pneumonia) 02/13/2016  . Edema 02/13/2016  . AKI (acute kidney injury) ([place]) 02/13/2016  . Elevated troponin 06/24/2015  . Diarrhea 06/23/2015  . Sepsis ([place]) 06/23/2015  . Obesity, morbid, BMI 50 or higher ([place]) 03/26/2015  . Post traumatic stress disorder (PTSD) 11/14/2014  . Leukocytosis 11/12/2014  . Slurred speech 11/12/2014  . CKD (chronic kidney disease) stage 3, GFR 30-59 ml/min   . Diabetes mellitus, type II, insulin dependent ([place])   . TIA (transient ischemic attack)   . Hypotension 10/28/2014  . Syncope 10/28/2014  . Anemia  10/28/2014  . Type II diabetes mellitus ([place]) 10/28/2014  . OSA on CPAP 10/28/2014  . Chronic lower back pain 10/28/2014  . Polyneuropathy in diabetes(357.2) 10/28/2014  . Diabetic polyneuropathy ([place])   . Fall 08/14/2014  . Recurrent falls 08/14/2014  . Hot flashes 02/24/2014  . Arthralgia 02/24/2014  . Hair loss 02/24/2014  . Chest pain 01/06/2014  . Shock ([place]) 08/08/2013  . 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 Dysarthria 12/30/2012  . Benign paroxysmal positional vertigo 12/30/2012  . Breast cancer of upper-outer quadrant of right female breast ([place]) 11/18/2012  . Hemorrhage of rectum and anus 09/28/2012  . Hx of radiation therapy   . Syncope and collapse 06/04/2011  . HSV 10/05/2008  . Asthma 06/02/2008  . HLD (hyperlipidemia) 06/02/2006  . Sinus tachycardia 05/25/2006  . Hypothyroidism 02/27/2006  . Obesity 02/27/2006  . Depression 02/27/2006  . Essential hypertension 02/27/2006  . GERD 02/27/2006  . KNEE PAIN 02/27/2006    Allyson Sabal [facility] 07/28/2016, 4:08 PM  [place], [place], [address] Phone: [phone]   Fax:  [phone]  Name: [name] MRN: [id number] Date of Birth: 16-Jul-1955  Manus Gunning, PT 07/28/16 4:08 PM

## 2016-07-29 ENCOUNTER — Ambulatory Visit: Payer: 59 | Admitting: Neurology

## 2016-08-06 ENCOUNTER — Ambulatory Visit: Payer: Medicare Other

## 2016-08-06 DIAGNOSIS — I890 Lymphedema, not elsewhere classified: Secondary | ICD-10-CM | POA: Diagnosis not present

## 2016-08-06 NOTE — Therapy (Signed)
[place], [place], [address] Phone: [phone]   Fax:  [phone]  Physical Therapy Treatment  Patient Details  Name: [name] MRN: [id number] Date of Birth: [birth year] Referring Provider: Magrinat  Encounter Date: 08/06/2016      PT End of Session - 08/06/16 1604    Visit Number 3   Number of Visits 10   Date for PT Re-Evaluation 08/25/16   PT Start Time 5678   PT Stop Time 1603   PT Time Calculation (min) 40 min   Activity Tolerance Patient tolerated treatment well   Behavior During Therapy [facility] for tasks assessed/performed      Past Medical History:  Diagnosis Date  . Anemia   . Anxiety   . Arthritis    "pretty much all my joints"  . Asthma   . Benign paroxysmal positional vertigo 12/30/2012  . 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  . Breast mass in female    L breast 2008, US showed likely fat necrosis  . Chronic lower back pain   . CKD (chronic kidney disease), stage III    "lower stage" (01/06/2014)  . Daily headache   . Depression   . Gait abnormality 05/29/2016  . GERD (gastroesophageal reflux disease)   . History of blood transfusion 2011   "after one of my OR's"  . History of hiatal hernia   . History of stomach ulcers   . HSV (herpes simplex virus) infection   . Hx of radiation therapy 05/05/12- 07/15/12   right breast, 45 gray x 25 fx, lumpectomy cavity boosted to 16.2 gray  . Hyperlipemia   . Hypertension    sees Dr. Criss Rosales , [facility] [place]  . Hypothyroidism   . Kidney stones   . Knee pain, bilateral   . Migraines    "~ 3 times/month" (01/06/2014)  . Obesity   . OSA on CPAP    pt does not know settings  . Pancreatitis    hx of  . Personal history of radiation therapy 02/2012  . Pneumonia 1950's  . Polyneuropathy in diabetes(357.2)   . Schizophrenia ([place])   . 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 Tachycardia    with sx in 2008  . Thyroid disease   . Type II diabetes mellitus ([place])     Past Surgical History:  Procedure Laterality Date  . ABDOMINAL HYSTERECTOMY  1979?   partial  . BREAST LUMPECTOMY  03/03/2012   Procedure: LUMPECTOMY;  Surgeon: Haywood Lasso, MD;  Location: [place];  Service: General;  Laterality: Right;  . BREAST SURGERY Right 01/2012   "cancer"  . CHOLECYSTECTOMY  1980's?  . COLONOSCOPY WITH PROPOFOL N/A 09/28/2012   Procedure: COLONOSCOPY WITH PROPOFOL;  Surgeon: Lear Ng, MD;  [facility];  Service: Endoscopy;  Laterality: N/A;  . FOOT FRACTURE SURGERY Right 1990's  . JOINT REPLACEMENT    . REVISION TOTAL KNEE ARTHROPLASTY  07/2009  . SHOULDER OPEN ROTATOR CUFF REPAIR Left 1990's  . THYROIDECTOMY  1970's  . TOTAL KNEE ARTHROPLASTY Bilateral 2009-06/2009   left; right    There were no vitals filed for this visit.      Subjective Assessment - 08/06/16 1528    Subjective My oxycodone has been reduced to 10 mg 4x/day. My tailbone is really bothering me today, I had shots for that pain on Monday and it hasn't calmed down yet.  Pertinent History (1)  status post right lumpectomy on 03/03/2012 for a 2.8 cm, grade 1 ductal carcinoma in situ, estrogen receptor 100% positive, progesterone receptor 100% positive, with clear margins.DM and HTN; (2)  completed radiation therapy under the care of Dr. Sondra Come 07/15/2012.  She does not currently have her HTN meds. Fell in November and sustained a hairline fracture in some UE bone so she is wearing a velcro wrist brace currently. Is on Lyrica for neuropathy from diabetes. h/o left RTC repair in about 2000.   Patient Stated Goals to be able to do self massage   Currently in Pain? No/denies   Pain Score 5    Pain Location Breast   Pain Orientation Right   Pain Descriptors / Indicators Other (Comment)  Hard   Pain Type Acute pain   Pain Onset 1 to 4 weeks ago   Aggravating Factors   touching it   Pain Relieving Factors resting/sleep   Multiple Pain Sites Yes   Pain Score 10   Pain Location Coccyx   Pain Descriptors / Indicators Sharp;Aching   Pain Type Chronic pain   Pain Onset More than a month ago   Aggravating Factors  sitting and walking   Pain Relieving Factors Laying flat on my back                         [facility] Adult PT Treatment/Exercise - 08/06/16 0001      Manual Therapy   Manual Therapy Manual Lymphatic Drainage (MLD)   Manual Lymphatic Drainage (MLD) In Supine with HOB slightly elevated: Short neck, 5 diaphragmatic breaths, Rt inguinal and Lt axillary nodes, Rt axillo-inguinal and anterior inter-axillary anastomosis then Rt breast, and briefly to Rt UE from lateral shoulder to elbow (pt did not want to remove hand/wrist brace) working from proximal to distal then retracing all steps.                        Long Term Clinic Goals - 07/28/16 1601      CC Long Term Goal  #1   Title Patient will be independent in self-manual lymph drainage for right breast and right UE.   Time 4   Period Weeks   Status New     CC Long Term Goal  #2   Title Patient will report at least 40% improvement in perception of hard spots and discomfort in right breast.   Time 4   Period Weeks   Status New     CC Long Term Goal  #3   Title Pt. will be knowledgeable about compression garments to assist with management of lymphedema (bras and possibly a sleeve).   Time 4   Period Weeks   Status New     CC Long Term Goal  #4   Title lymphedema life impact scale score reduced to impairment rating of 20% or less   Baseline score of 21 = 31% impairment at eval   Time 4   Period Weeks   Status New     CC Long Term Goal  #5   Title Pt will be independent in a home exercise program for LE strengthening and UE strenthening and ROM   Time 4   Period Weeks   Status New            Plan - 08/06/16 1605    Clinical Impression Statement  Pt tolerated first session of manual lymph drainage well  today with initial instruction of basics of lymphatic system and intro into sequence of massage. Pt wanted hand brace left on Rt hand/wrist so just performed manual lymph drainage to this point.    Rehab Potential Good   Clinical Impairments Affecting Rehab Potential hx of radiation, severe pain due to herniated discs in back, pt reports she is having a hard time coming off her one of her prescriptions and sometimes she hears and sees things   PT Frequency 2x / week   PT Duration 4 weeks   PT Treatment/Interventions ADLs/Self Care Home Management;DME Instruction;Therapeutic exercise;Patient/family education;Orthotic Fit/Training;Manual techniques;Manual lymph drainage;Compression bandaging;Scar mobilization;Passive range of motion;Taping;Therapeutic activities   PT Next Visit Plan Cont manual lymph drainage for right breast and UE, as well as instruction in same- eventually add LE strengthening exercises or encourage pt to continue exercises issued by home health   Consulted and Agree with Plan of Care Patient      Patient will benefit from skilled therapeutic intervention in order to improve the following deficits and impairments:  Increased edema, Decreased knowledge of precautions, Decreased knowledge of use of DME, Pain, Decreased range of motion, Decreased strength  Visit Diagnosis: Lymphedema, not elsewhere classified     Problem List Patient Active Problem List   Diagnosis Date Noted  . Gait abnormality 05/29/2016  . Encephalopathy acute 04/23/2016  . Hyponatremia 04/23/2016  . Schizophrenia, paranoid ([place])   . Aspiration pneumonia of left lower lobe due to gastric secretions ([place])   . Projectile vomiting with nausea   . Altered mental status   . Schizophrenia ([place])   . Disorganized schizophrenia ([place])   . Chronic pain syndrome   . Acute renal failure ([place])   . Controlled diabetes mellitus type 2 with complications ([place])    . Acute encephalopathy   . Oropharyngeal dysphagia   . Aspiration pneumonia of right lower lobe due to vomit ([place])   . Acute renal failure (ARF) ([place]) 03/28/2016  . Encephalopathy 03/27/2016  . HCAP (healthcare-associated pneumonia) 02/29/2016  . Pneumonia 02/14/2016  . CAP (community acquired pneumonia) 02/13/2016  . Edema 02/13/2016  . AKI (acute kidney injury) ([place]) 02/13/2016  . Elevated troponin 06/24/2015  . Diarrhea 06/23/2015  . Sepsis ([place]) 06/23/2015  . Obesity, morbid, BMI 50 or higher ([place]) 03/26/2015  . Post traumatic stress disorder (PTSD) 11/14/2014  . Leukocytosis 11/12/2014  . Slurred speech 11/12/2014  . CKD (chronic kidney disease) stage 3, GFR 30-59 ml/min   . Diabetes mellitus, type II, insulin dependent ([place])   . TIA (transient ischemic attack)   . Hypotension 10/28/2014  . Syncope 10/28/2014  . Anemia 10/28/2014  . Type II diabetes mellitus ([place]) 10/28/2014  . OSA on CPAP 10/28/2014  . Chronic lower back pain 10/28/2014  . Polyneuropathy in diabetes(357.2) 10/28/2014  . Diabetic polyneuropathy ([place])   . Fall 08/14/2014  . Recurrent falls 08/14/2014  . Hot flashes 02/24/2014  . Arthralgia 02/24/2014  . Hair loss 02/24/2014  . Chest pain 01/06/2014  . Shock ([place]) 08/08/2013  . 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 Dysarthria 12/30/2012  . Benign paroxysmal positional vertigo 12/30/2012  . Breast cancer of upper-outer quadrant of right female breast ([place]) 11/18/2012  . Hemorrhage of rectum and anus 09/28/2012  . Hx of radiation therapy   . Syncope and collapse 06/04/2011  . HSV 10/05/2008  . Asthma 06/02/2008  . HLD (hyperlipidemia) 06/02/2006  . Sinus tachycardia 05/25/2006  . Hypothyroidism 02/27/2006  . Obesity 02/27/2006  . Depression 02/27/2006  . Essential  hypertension 02/27/2006  . GERD 02/27/2006  . KNEE PAIN 02/27/2006    Otelia Limes, PTA 08/06/2016, 4:08 PM  [place], [place], [address] Phone: [phone]   Fax:  [phone]  Name: [name] MRN: [id number] Date of Birth: 01-May-1955

## 2016-08-08 ENCOUNTER — Ambulatory Visit: Payer: Medicare Other | Admitting: Physical Therapy

## 2016-08-08 DIAGNOSIS — I890 Lymphedema, not elsewhere classified: Secondary | ICD-10-CM | POA: Diagnosis not present

## 2016-08-08 DIAGNOSIS — M25612 Stiffness of left shoulder, not elsewhere classified: Secondary | ICD-10-CM

## 2016-08-08 DIAGNOSIS — M6281 Muscle weakness (generalized): Secondary | ICD-10-CM

## 2016-08-08 DIAGNOSIS — M25611 Stiffness of right shoulder, not elsewhere classified: Secondary | ICD-10-CM

## 2016-08-08 NOTE — Therapy (Signed)
[place], [place], [address] Phone: [phone]   Fax:  [phone]  Physical Therapy Treatment  Patient Details  Name: [name] MRN: [id number] Date of Birth: 21-Feb-1955 Referring Provider: Magrinat  Encounter Date: 08/08/2016      PT End of Session - 08/08/16 1222    Visit Number 4   Number of Visits 10   Date for PT Re-Evaluation 08/25/16   PT Start Time 1105   PT Stop Time 1145   PT Time Calculation (min) 40 min   Activity Tolerance Patient tolerated treatment well   Behavior During Therapy [facility] for tasks assessed/performed      Past Medical History:  Diagnosis Date  . Anemia   . Anxiety   . Arthritis    "pretty much all my joints"  . Asthma   . Benign paroxysmal positional vertigo 12/30/2012  . 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  . Breast mass in female    L breast 2008, US showed likely fat necrosis  . Chronic lower back pain   . CKD (chronic kidney disease), stage III    "lower stage" (01/06/2014)  . Daily headache   . Depression   . Gait abnormality 05/29/2016  . GERD (gastroesophageal reflux disease)   . History of blood transfusion 2011   "after one of my OR's"  . History of hiatal hernia   . History of stomach ulcers   . HSV (herpes simplex virus) infection   . Hx of radiation therapy 05/05/12- 07/15/12   right breast, 45 gray x 25 fx, lumpectomy cavity boosted to 16.2 gray  . Hyperlipemia   . Hypertension    sees Dr. Criss Rosales , [facility] [place]  . Hypothyroidism   . Kidney stones   . Knee pain, bilateral   . Migraines    "~ 3 times/month" (01/06/2014)  . Obesity   . OSA on CPAP    pt does not know settings  . Pancreatitis    hx of  . Personal history of radiation therapy 02/2012  . Pneumonia 1950's  . Polyneuropathy in diabetes(357.2)   . Schizophrenia ([place])   . 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 Tachycardia    with sx in 2008  . Thyroid disease   . Type II diabetes mellitus ([place])     Past Surgical History:  Procedure Laterality Date  . ABDOMINAL HYSTERECTOMY  1979?   partial  . BREAST LUMPECTOMY  03/03/2012   Procedure: LUMPECTOMY;  Surgeon: Haywood Lasso, MD;  Location: [place];  Service: General;  Laterality: Right;  . BREAST SURGERY Right 01/2012   "cancer"  . CHOLECYSTECTOMY  1980's?  . COLONOSCOPY WITH PROPOFOL N/A 09/28/2012   Procedure: COLONOSCOPY WITH PROPOFOL;  Surgeon: Lear Ng, MD;  [facility];  Service: Endoscopy;  Laterality: N/A;  . FOOT FRACTURE SURGERY Right 1990's  . JOINT REPLACEMENT    . REVISION TOTAL KNEE ARTHROPLASTY  07/2009  . SHOULDER OPEN ROTATOR CUFF REPAIR Left 1990's  . THYROIDECTOMY  1970's  . TOTAL KNEE ARTHROPLASTY Bilateral 2009-06/2009   left; right    There were no vitals filed for this visit.      Subjective Assessment - 08/08/16 1112    Subjective Pt is asking questions about getting a compression sleeve and compression bra    Pertinent History (1)  status post right lumpectomy on 03/03/2012 for a 2.8 cm, grade 1  ductal carcinoma in situ, estrogen receptor 100% positive, progesterone receptor 100% positive, with clear margins.DM and HTN; (2)  completed radiation therapy under the care of Dr. Sondra Come 07/15/2012.  She does not currently have her HTN meds. Fell in November and sustained a hairline fracture in some UE bone so she is wearing a velcro wrist brace currently. Is on Lyrica for neuropathy from diabetes. h/o left RTC repair in about 2000.   Patient Stated Goals to be able to do self massage   Currently in Pain? Yes   Pain Score 9    Pain Location Back   Pain Orientation Medial   Pain Descriptors / Indicators Aching;Sharp   Pain Type Chronic pain   Pain Onset More than a month ago   Pain Frequency Constant   Aggravating Factors  sitting and walking                           [facility] Adult PT Treatment/Exercise - 08/08/16 0001      Self-Care   Self-Care Other Self-Care Comments   Other Self-Care Comments  talked with patient about compression bra or possibly vest with right upper arm attachment.  Demographics sent to [facility]      Manual Therapy   Manual Therapy Manual Lymphatic Drainage (MLD)   Manual Lymphatic Drainage (MLD) In Supine with HOB slightly elevated: Short neck, 5 diaphragmatic breaths, Rt inguinal and Lt axillary nodes, Rt axillo-inguinal and anterior inter-axillary anastomosis then Rt breast, and briefly to Rt UE from lateral shoulder to elbow (pt did not want to remove hand/wrist brace) working from proximal to distal then retracing all steps.then pt to half left sidelying for posterior interaxillary anastamosis and to sittin for across upper back and right upper arm                         Long Term Clinic Goals - 07/28/16 1601      CC Long Term Goal  #1   Title Patient will be independent in self-manual lymph drainage for right breast and right UE.   Time 4   Period Weeks   Status New     CC Long Term Goal  #2   Title Patient will report at least 40% improvement in perception of hard spots and discomfort in right breast.   Time 4   Period Weeks   Status New     CC Long Term Goal  #3   Title Pt. will be knowledgeable about compression garments to assist with management of lymphedema (bras and possibly a sleeve).   Time 4   Period Weeks   Status New     CC Long Term Goal  #4   Title lymphedema life impact scale score reduced to impairment rating of 20% or less   Baseline score of 21 = 31% impairment at eval   Time 4   Period Weeks   Status New     CC Long Term Goal  #5   Title Pt will be independent in a home exercise program for LE strengthening and UE strenthening and ROM   Time 4   Period Weeks   Status New            Plan - 08/08/16 1223    Clinical Impression Statement Little fibrosis in right  breast today.  Discussed options for compression. Feel pt will have difficulty with compression sleeve for right upper arm due to  shape of arm and swelling in proximal portion of arm  Discussed compression bra vs. vest with right upper arm attachment Will send demographcis to [facility] as pt has Medicaid    Clinical Impairments Affecting Rehab Potential hx of radiation, severe pain due to herniated discs in back, pt reports she is having a hard time coming off her one of her prescriptions and sometimes she hears and sees things   PT Treatment/Interventions ADLs/Self Care Home Management;DME Instruction;Therapeutic exercise;Patient/family education;Orthotic Fit/Training;Manual techniques;Manual lymph drainage;Compression bandaging;Scar mobilization;Passive range of motion;Taping;Therapeutic activities   PT Next Visit Plan Cont manual lymph drainage for right breast and UE, as well as instruction in same- eventually add LE strengthening exercises or encourage pt to continue exercises issued by home health      Patient will benefit from skilled therapeutic intervention in order to improve the following deficits and impairments:  Increased edema, Decreased knowledge of precautions, Decreased knowledge of use of DME, Pain, Decreased range of motion, Decreased strength  Visit Diagnosis: Lymphedema, not elsewhere classified  Stiffness of right shoulder, not elsewhere classified  Stiffness of left shoulder, not elsewhere classified  Muscle weakness (generalized)     Problem List Patient Active Problem List   Diagnosis Date Noted  . Gait abnormality 05/29/2016  . Encephalopathy acute 04/23/2016  . Hyponatremia 04/23/2016  . Schizophrenia, paranoid ([place])   . Aspiration pneumonia of left lower lobe due to gastric secretions ([place])   . Projectile vomiting with nausea   . Altered mental status   . Schizophrenia ([place])   . Disorganized schizophrenia ([place])   . Chronic pain syndrome   .  Acute renal failure ([place])   . Controlled diabetes mellitus type 2 with complications ([place])   . Acute encephalopathy   . Oropharyngeal dysphagia   . Aspiration pneumonia of right lower lobe due to vomit ([place])   . Acute renal failure (ARF) ([place]) 03/28/2016  . Encephalopathy 03/27/2016  . HCAP (healthcare-associated pneumonia) 02/29/2016  . Pneumonia 02/14/2016  . CAP (community acquired pneumonia) 02/13/2016  . Edema 02/13/2016  . AKI (acute kidney injury) ([place]) 02/13/2016  . Elevated troponin 06/24/2015  . Diarrhea 06/23/2015  . Sepsis ([place]) 06/23/2015  . Obesity, morbid, BMI 50 or higher ([place]) 03/26/2015  . Post traumatic stress disorder (PTSD) 11/14/2014  . Leukocytosis 11/12/2014  . Slurred speech 11/12/2014  . CKD (chronic kidney disease) stage 3, GFR 30-59 ml/min   . Diabetes mellitus, type II, insulin dependent ([place])   . TIA (transient ischemic attack)   . Hypotension 10/28/2014  . Syncope 10/28/2014  . Anemia 10/28/2014  . Type II diabetes mellitus ([place]) 10/28/2014  . OSA on CPAP 10/28/2014  . Chronic lower back pain 10/28/2014  . Polyneuropathy in diabetes(357.2) 10/28/2014  . Diabetic polyneuropathy ([place])   . Fall 08/14/2014  . Recurrent falls 08/14/2014  . Hot flashes 02/24/2014  . Arthralgia 02/24/2014  . Hair loss 02/24/2014  . Chest pain 01/06/2014  . Shock ([place]) 08/08/2013  . 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 Dysarthria 12/30/2012  . Benign paroxysmal positional vertigo 12/30/2012  . Breast cancer of upper-outer quadrant of right female breast ([place]) 11/18/2012  . Hemorrhage of rectum and anus 09/28/2012  . Hx of radiation therapy   . Syncope and collapse 06/04/2011  . HSV 10/05/2008  . Asthma 06/02/2008  . HLD (hyperlipidemia) 06/02/2006  . Sinus tachycardia 05/25/2006  . Hypothyroidism 02/27/2006  . Obesity 02/27/2006  . Depression 02/27/2006  . Essential hypertension 02/27/2006  . GERD 02/27/2006  . KNEE  PAIN 02/27/2006   Donato Heinz. Owens Shark  PT  Norwood Levo 08/08/2016, 12:26 PM  [place], [place], [address] Phone: [phone]   Fax:  [phone]  Name: [name] MRN: [id number] Date of Birth: [birth year]

## 2016-08-13 ENCOUNTER — Encounter: Payer: Self-pay | Admitting: Physical Therapy

## 2016-08-13 ENCOUNTER — Ambulatory Visit: Payer: Medicare Other | Admitting: Physical Therapy

## 2016-08-13 DIAGNOSIS — I890 Lymphedema, not elsewhere classified: Secondary | ICD-10-CM | POA: Diagnosis not present

## 2016-08-13 NOTE — Therapy (Signed)
[place], [place], [address] Phone: [phone]   Fax:  [phone]  Physical Therapy Treatment  Patient Details  Name: [name] MRN: [id number] Date of Birth: 13-Jun-1955 Referring Provider: Magrinat  Encounter Date: 08/13/2016      PT End of Session - 08/13/16 1720    Visit Number 5   Number of Visits 10   Date for PT Re-Evaluation 08/25/16   PT Start Time 1522   PT Stop Time 1605   PT Time Calculation (min) 43 min   Activity Tolerance Patient tolerated treatment well   Behavior During Therapy [facility] for tasks assessed/performed      Past Medical History:  Diagnosis Date  . Anemia   . Anxiety   . Arthritis    "pretty much all my joints"  . Asthma   . Benign paroxysmal positional vertigo 12/30/2012  . 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  . Breast mass in female    L breast 2008, US showed likely fat necrosis  . Chronic lower back pain   . CKD (chronic kidney disease), stage III    "lower stage" (01/06/2014)  . Daily headache   . Depression   . Gait abnormality 05/29/2016  . GERD (gastroesophageal reflux disease)   . History of blood transfusion 2011   "after one of my OR's"  . History of hiatal hernia   . History of stomach ulcers   . HSV (herpes simplex virus) infection   . Hx of radiation therapy 05/05/12- 07/15/12   right breast, 45 gray x 25 fx, lumpectomy cavity boosted to 16.2 gray  . Hyperlipemia   . Hypertension    sees Dr. Criss Rosales , [facility]  . Hypothyroidism   . Kidney stones   . Knee pain, bilateral   . Migraines    "~ 3 times/month" (01/06/2014)  . Obesity   . OSA on CPAP    pt does not know settings  . Pancreatitis    hx of  . Personal history of radiation therapy 02/2012  . Pneumonia 1950's  . Polyneuropathy in diabetes(357.2)   . Schizophrenia ([place])   . 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 Tachycardia    with sx in 2008  . Thyroid disease   . Type II diabetes mellitus ([place])     Past Surgical History:  Procedure Laterality Date  . ABDOMINAL HYSTERECTOMY  1979?   partial  . BREAST LUMPECTOMY  03/03/2012   Procedure: LUMPECTOMY;  Surgeon: Haywood Lasso, MD;  Location: [facility];  Service: General;  Laterality: Right;  . BREAST SURGERY Right 01/2012   "cancer"  . CHOLECYSTECTOMY  1980's?  . COLONOSCOPY WITH PROPOFOL N/A 09/28/2012   Procedure: COLONOSCOPY WITH PROPOFOL;  Surgeon: Lear Ng, MD;  Location: [facility];  Service: Endoscopy;  Laterality: N/A;  . FOOT FRACTURE SURGERY Right 1990's  . JOINT REPLACEMENT    . REVISION TOTAL KNEE ARTHROPLASTY  07/2009  . SHOULDER OPEN ROTATOR CUFF REPAIR Left 1990's  . THYROIDECTOMY  1970's  . TOTAL KNEE ARTHROPLASTY Bilateral 2009-06/2009   left; right    There were no vitals filed for this visit.      Subjective Assessment - 08/13/16 1524    Subjective I am having such bad indigestion. I have to make it through this appointment.    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 Sondra Come 07/15/2012.  She does not currently have her HTN meds. Fell in November and sustained a hairline fracture in some UE bone so she is wearing a velcro wrist brace currently. Is on Lyrica for neuropathy from diabetes. h/o left RTC repair in about 2000.   Patient Stated Goals to be able to do self massage   Currently in Pain? Yes   Pain Score 10-Worst pain ever   Pain Location Epigastric   Pain Orientation Medial   Pain Descriptors / Indicators Burning;Sharp   Pain Type Acute pain   Pain Onset Today   Pain Frequency Constant                         [facility] Adult PT Treatment/Exercise -  08/13/16 0001      Manual Therapy   Manual Therapy Manual Lymphatic Drainage (MLD)   Manual Lymphatic Drainage (MLD) In Supine with HOB slightly elevated: Short neck, 5 diaphragmatic breaths, Rt inguinal and Lt axillary nodes, Rt axillo-inguinal and anterior inter-axillary anastomosis then Rt breast, and briefly to Rt UE from lateral shoulder to elbow (pt did not want to remove hand/wrist brace) working from proximal to distal then retracing all steps- issued handout to patient with instructions for drainage of right breast and R UE - had pt demonstrate correct sequence and technique throughout                        [place] - 07/28/16 1601      CC Long Term Goal  #1   Title Patient will be independent in self-manual lymph drainage for right breast and right UE.   Time 4   Period Weeks   Status New     CC Long Term Goal  #2   Title Patient will report at least 40% improvement in perception of hard spots and discomfort in right breast.   Time 4   Period Weeks   Status New     CC Long Term Goal  #3   Title Pt. will be knowledgeable about compression garments to assist with management of lymphedema (bras and possibly a sleeve).   Time 4   Period Weeks   Status New     CC Long Term Goal  #4   Title lymphedema life impact scale score reduced to impairment rating of 20% or less   Baseline score of 21 = 31% impairment at eval   Time 4   Period Weeks   Status New     CC Long Term Goal  #5   Title Pt will be independent in a home exercise program for LE strengthening and UE strenthening and ROM   Time 4   Period Weeks   Status New            Plan - 08/13/16 1721    Clinical Impression Statement Issued handout with instructions for performing self drainage technique to right breast and UE today. Had pt follow handout and gave verbal and tactile cues for pt to follow. Pt demonstrate correct sequence and hand technique with mod cueing. Patient to  perform self drainage at home following instructions in handout.    Rehab Potential Good   Clinical Impairments Affecting Rehab Potential hx of radiation, severe pain due to  herniated discs in back, pt reports she is having a hard time coming off her one of her prescriptions and sometimes she hears and sees things   PT Frequency 2x / week   PT Duration 4 weeks   PT Treatment/Interventions ADLs/Self Care Home Management;DME Instruction;Therapeutic exercise;Patient/family education;Orthotic Fit/Training;Manual techniques;Manual lymph drainage;Compression bandaging;Scar mobilization;Passive range of motion;Taping;Therapeutic activities   PT Next Visit Plan Cont manual lymph drainage for right breast and UE, as well as instruction in same- eventually add LE strengthening exercises or encourage pt to continue exercises issued by home health   Consulted and Agree with Plan of Care Patient      Patient will benefit from skilled therapeutic intervention in order to improve the following deficits and impairments:  Increased edema, Decreased knowledge of precautions, Decreased knowledge of use of DME, Pain, Decreased range of motion, Decreased strength  Visit Diagnosis: Lymphedema, not elsewhere classified     Problem List Patient Active Problem List   Diagnosis Date Noted  . Gait abnormality 05/29/2016  . Encephalopathy acute 04/23/2016  . Hyponatremia 04/23/2016  . Schizophrenia, paranoid ([place])   . Aspiration pneumonia of left lower lobe due to gastric secretions ([place])   . Projectile vomiting with nausea   . Altered mental status   . Schizophrenia ([place])   . Disorganized schizophrenia ([place])   . Chronic pain syndrome   . Acute renal failure ([place])   . Controlled diabetes mellitus type 2 with complications ([place])   . Acute encephalopathy   . Oropharyngeal dysphagia   . Aspiration pneumonia of right lower lobe due to vomit ([place])   . Acute renal failure (ARF) ([place]) 03/28/2016  . Encephalopathy  03/27/2016  . HCAP (healthcare-associated pneumonia) 02/29/2016  . Pneumonia 02/14/2016  . CAP (community acquired pneumonia) 02/13/2016  . Edema 02/13/2016  . AKI (acute kidney injury) ([place]) 02/13/2016  . Elevated troponin 06/24/2015  . Diarrhea 06/23/2015  . Sepsis ([place]) 06/23/2015  . Obesity, morbid, BMI 50 or higher ([place]) 03/26/2015  . Post traumatic stress disorder (PTSD) 11/14/2014  . Leukocytosis 11/12/2014  . Slurred speech 11/12/2014  . CKD (chronic kidney disease) stage 3, GFR 30-59 ml/min   . Diabetes mellitus, type II, insulin dependent ([place])   . TIA (transient ischemic attack)   . Hypotension 10/28/2014  . Syncope 10/28/2014  . Anemia 10/28/2014  . Type II diabetes mellitus ([place]) 10/28/2014  . OSA on CPAP 10/28/2014  . Chronic lower back pain 10/28/2014  . Polyneuropathy in diabetes(357.2) 10/28/2014  . Diabetic polyneuropathy ([place])   . Fall 08/14/2014  . Recurrent falls 08/14/2014  . Hot flashes 02/24/2014  . Arthralgia 02/24/2014  . Hair loss 02/24/2014  . Chest pain 01/06/2014  . Shock ([place]) 08/08/2013  . 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 Dysarthria 12/30/2012  . Benign paroxysmal positional vertigo 12/30/2012  . Breast cancer of upper-outer quadrant of right female breast ([place]) 11/18/2012  . Hemorrhage of rectum and anus 09/28/2012  . Hx of radiation therapy   . Syncope and collapse 06/04/2011  . HSV 10/05/2008  . Asthma 06/02/2008  . HLD (hyperlipidemia) 06/02/2006  . Sinus tachycardia 05/25/2006  . Hypothyroidism 02/27/2006  . Obesity 02/27/2006  . Depression 02/27/2006  . Essential hypertension 02/27/2006  . GERD 02/27/2006  . KNEE PAIN 02/27/2006    Allyson Sabal [facility] 08/13/2016, 5:23 PM  [place], [place], [address] Phone: [phone]   Fax:  [phone]  Name: [name] MRN: [id number] Date of Birth: 1955/10/04  Allyson Sabal [place],  [place] 08/13/16 5:23 PM

## 2016-08-13 NOTE — Patient Instructions (Signed)
Self manual lymph drainage: Perform this sequence once a day.  Only give enough pressure no your skin to make the skin move.  Diaphragmatic - Supine   Inhale through nose making navel move out toward hands. Exhale through puckered lips, hands follow navel in. Repeat _5__ times. Rest _10__ seconds between repeats.   Copyright  VHI. All rights reserved.  Hug yourself.  Do circles at your neck just above your collarbones.  Repeat this 10 times.  Axilla - One at a Time   Using full weight of flat hand and fingers at center of uninvolved armpit, make _10__ in-place circles.   Copyright  VHI. All rights reserved.  LEG: Inguinal Nodes Stimulation   With small finger side of hand against hip crease on involved side, gently perform circles at the crease. Repeat __10_ times.   Copyright  VHI. All rights reserved.  1) Axilla to Inguinal Nodes - Sweep   On involved side, sweep _4__ times from armpit along side of trunk to hip crease.  Now gently stretch skin from the involved side to the uninvolved side across the chest at the shoulder line.  Repeat that 4 times.  Draw an imaginary diagonal line from upper outer breast through the nipple area toward lower inner breast.  Direct fluid upward and inward from this line toward the pathway across your upper chest .  Do this in three rows to treat all of the upper inner breast tissue, and do each row 3-4x.      Direct fluid to treat all of lower outer breast tissue downward and outward toward      pathway that is aimed at the left groin.  Finish by doing the pathways as described above going from your involved armpit to the same side groin and going across your upper chest from the involved shoulder to the uninvolved shoulder.  Repeat the steps above where you do circles in your left groin and right armpit. Copyright  VHI. All rights reserved.   Self manual lymph drainage: Perform this sequence once a day.  Only give enough pressure no your  skin to make the skin move.  Diaphragmatic - Supine   Inhale through nose making navel move out toward hands. Exhale through puckered lips, hands follow navel in. Repeat _5__ times. Rest _10__ seconds between repeats.   Copyright  VHI. All rights reserved.  Hug yourself.  Do circles at your neck just above your collarbones.  Repeat this 10 times.  Axilla - One at a Time   Using full weight of flat hand and fingers at center of uninvolved armpit, make _10__ in-place circles.   Copyright  VHI. All rights reserved.  LEG: Inguinal Nodes Stimulation   With small finger side of hand against hip crease on involved side, gently perform circles at the crease. Repeat __10_ times.   Copyright  VHI. All rights reserved.  2) Axilla to Inguinal Nodes - Sweep   On involved side, sweep _4__ times from armpit along side of trunk to hip crease.  Now gently stretch skin from the involved side to the uninvolved side across the chest at the shoulder line.  Repeat that 4 times.  Draw an imaginary diagonal line from upper outer breast through the nipple area toward lower inner breast.  Direct fluid upward and inward from this line toward the pathway across your upper chest .  Do this in three rows to treat all of the upper inner breast tissue, and do each row 3-4x.  Direct fluid to treat all of lower outer breast tissue downward and outward toward      pathway that is aimed at the left groin.  Finish by doing the pathways as described above going from your involved armpit to the same side groin and going across your upper chest from the involved shoulder to the uninvolved shoulder.  Repeat the steps above where you do circles in your left groin and right armpit. Copyright  VHI. All rights reserved.   Deep Effective Breath   Standing, sitting, or laying down, place both hands on the belly. Take a deep breath IN, expanding the belly; then breath OUT, contracting the belly. Repeat __5__  times. Do __2-3__ sessions per day and before your self massage.  http://[email or web address].exer.us/866   Copyright  VHI. All rights reserved.  Axilla to Axilla - Sweep   On uninvolved side make 5 circles in the armpit, then pump _5__ times from involved armpit across chest to uninvolved armpit, making a pathway. Do _1__ time per day.  Copyright  VHI. All rights reserved.  Axilla to Inguinal Nodes - Sweep   On involved side, make 5 circles at groin at panty line, then pump _5__ times from armpit along side of trunk to outer hip, making your other pathway. Do __1_ time per day.  Copyright  VHI. All rights reserved.  Arm Posterior: Elbow to Shoulder - Sweep   Pump _5__ times from back of elbow to top of shoulder. Then inner to outer upper arm _5_ times, then outer arm again _5_ times. Then back to the pathways _2-3_ times. Do _1__ time per day.  Copyright  VHI. All rights reserved.  ARM: [place] Wrist to Elbow - Sweep   Pump or stationary circles _5__ times from wrist to elbow making sure to do both sides of the forearm. Then retrace your steps to the outer arm, and the pathways _2-3_ times each. Do _1__ time per day.  Copyright  VHI. All rights reserved.  ARM: Dorsum of Hand to Shoulder - Sweep   Pump or stationary circles _5__ times on back of hand including knuckle spaces and individual fingers if needed working up towards the wrist, then retrace all your steps working back up the forearm, doing both sides; upper outer arm and back to your pathways _2-3_ times each. Then do 5 circles again at uninvolved armpit and involved groin where you started! Good job!! Do __1_ time per day.  Copyright  VHI. All rights reserved.

## 2016-08-18 ENCOUNTER — Encounter: Payer: Self-pay | Admitting: Physical Therapy

## 2016-08-19 ENCOUNTER — Ambulatory Visit: Payer: Medicare Other | Attending: Adult Health | Admitting: Physical Therapy

## 2016-08-19 ENCOUNTER — Encounter: Payer: Self-pay | Admitting: Physical Therapy

## 2016-08-19 DIAGNOSIS — M25611 Stiffness of right shoulder, not elsewhere classified: Secondary | ICD-10-CM | POA: Insufficient documentation

## 2016-08-19 DIAGNOSIS — M6281 Muscle weakness (generalized): Secondary | ICD-10-CM | POA: Diagnosis present

## 2016-08-19 DIAGNOSIS — I890 Lymphedema, not elsewhere classified: Secondary | ICD-10-CM

## 2016-08-19 NOTE — Therapy (Signed)
[place], [place], [address] Phone: [phone]   Fax:  [phone]  Physical Therapy Treatment  Patient Details  Name: [name] MRN: [id number] Date of Birth: 31-Oct-1955 Referring Provider: Magrinat  Encounter Date: 08/19/2016      PT End of Session - 08/19/16 1658    Visit Number 6   Number of Visits 10   Date for PT Re-Evaluation 08/25/16   PT Start Time 1522   PT Stop Time 1602   PT Time Calculation (min) 40 min   Activity Tolerance Patient tolerated treatment well      Past Medical History:  Diagnosis Date  . Anemia   . Anxiety   . Arthritis    "pretty much all my joints"  . Asthma   . Benign paroxysmal positional vertigo 12/30/2012  . Breast cancer ([place]) 02/13/12   ruq  100'clock bx Ductal Carcinoma in Situ,(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  . Breast mass in female    L breast 2008, US showed likely fat necrosis  . Chronic lower back pain   . CKD (chronic kidney disease), stage III    "lower stage" (01/06/2014)  . Daily headache   . Depression   . Gait abnormality 05/29/2016  . GERD (gastroesophageal reflux disease)   . History of blood transfusion 2011   "after one of my OR's"  . History of hiatal hernia   . History of stomach ulcers   . HSV (herpes simplex virus) infection   . Hx of radiation therapy 05/05/12- 07/15/12   right breast, 45 gray x 25 fx, lumpectomy cavity boosted to 16.2 gray  . Hyperlipemia   . Hypertension    sees Dr. Criss Rosales , [facility] [place]  . Hypothyroidism   . Kidney stones   . Knee pain, bilateral   . Migraines    "~ 3 times/month" (01/06/2014)  . Obesity   . OSA on CPAP    pt does not know settings  . Pancreatitis    hx of  . Personal history of radiation therapy 02/2012  . Pneumonia 1950's  . Polyneuropathy in diabetes(357.2)   . Schizophrenia ([place])   . 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 Tachycardia    with sx in 2008  . Thyroid disease    . Type II diabetes mellitus ([place])     Past Surgical History:  Procedure Laterality Date  . ABDOMINAL HYSTERECTOMY  1979?   partial  . BREAST LUMPECTOMY  03/03/2012   Procedure: LUMPECTOMY;  Surgeon: Haywood Lasso, MD;  Location: [facility];  Service: General;  Laterality: Right;  . BREAST SURGERY Right 01/2012   "cancer"  . CHOLECYSTECTOMY  1980's?  . COLONOSCOPY WITH PROPOFOL N/A 09/28/2012   Procedure: COLONOSCOPY WITH PROPOFOL;  Surgeon: Lear Ng, MD;  Location: [facility];  Service: Endoscopy;  Laterality: N/A;  . FOOT FRACTURE SURGERY Right 1990's  . JOINT REPLACEMENT    . REVISION TOTAL KNEE ARTHROPLASTY  07/2009  . SHOULDER OPEN ROTATOR CUFF REPAIR Left 1990's  . THYROIDECTOMY  1970's  . TOTAL KNEE ARTHROPLASTY Bilateral 2009-06/2009   left; right    There were no vitals filed for this visit.      Subjective Assessment - 08/19/16 1524    Subjective I went to the doctor yesterday because I have been coughing up a lot of dark yellow stuff and when I reached up for the x ray I felt like something snapped in my spine. I woke up this morning  coughing really bad. Pt states she has been trying to do the massage for the right breast but is not understanding it.    Pertinent History (1)  status post right lumpectomy on 03/03/2012 for a 2.8 cm, grade 1 ductal carcinoma in situ, estrogen receptor 100% positive, progesterone receptor 100% positive, with clear margins.DM and HTN; (2)  completed radiation therapy under the care of Dr. Sondra Come 07/15/2012.  She does not currently have her HTN meds. Fell in November and sustained a hairline fracture in some UE bone so she is wearing a velcro wrist brace currently. Is on Lyrica for neuropathy from diabetes. h/o left RTC repair in about 2000.   Patient Stated Goals to be able to do self massage   Currently in Pain? Yes   Pain Score 10-Worst pain ever   Pain Location Back  left thigh, buttocks   Pain Orientation Lower   Pain  Descriptors / Indicators Sharp   Pain Type Chronic pain   Pain Onset More than a month ago   Pain Frequency Intermittent                         [facility] Adult PT Treatment/Exercise - 08/19/16 0001      Manual Therapy   Manual Therapy Manual Lymphatic Drainage (MLD)   Manual Lymphatic Drainage (MLD) In Supine with HOB slightly elevated: Short neck, 5 diaphragmatic breaths, Rt inguinal and Lt axillary nodes, Rt axillo-inguinal and anterior inter-axillary anastomosis then Rt breast, and briefly to Rt UE from lateral shoulder to hand working from proximal to distal then retracing all steps while educating pt throughout                         [place] - 07/28/16 1601      CC Long Term Goal  #1   Title Patient will be independent in self-manual lymph drainage for right breast and right UE.   Time 4   Period Weeks   Status New     CC Long Term Goal  #2   Title Patient will report at least 40% improvement in perception of hard spots and discomfort in right breast.   Time 4   Period Weeks   Status New     CC Long Term Goal  #3   Title Pt. will be knowledgeable about compression garments to assist with management of lymphedema (bras and possibly a sleeve).   Time 4   Period Weeks   Status New     CC Long Term Goal  #4   Title lymphedema life impact scale score reduced to impairment rating of 20% or less   Baseline score of 21 = 31% impairment at eval   Time 4   Period Weeks   Status New     CC Long Term Goal  #5   Title Pt will be independent in a home exercise program for LE strengthening and UE strenthening and ROM   Time 4   Period Weeks   Status New            Plan - 08/19/16 1658    Clinical Impression Statement Continued with MLD to R breast and UE. Pt states she is wearing her compression bra but took it off prior to her appointment because it is difficult for her to don. Re educated pt in proper self drainage techinque.  Pt complianing of increased tenderness to the touch in  area of outer right breast but this seemed to ease up during treatment session.    Rehab Potential Good   Clinical Impairments Affecting Rehab Potential hx of radiation, severe pain due to herniated discs in back, pt reports she is having a hard time coming off her one of her prescriptions and sometimes she hears and sees things   PT Frequency 2x / week   PT Duration 4 weeks   PT Treatment/Interventions ADLs/Self Care Home Management;DME Instruction;Therapeutic exercise;Patient/family education;Orthotic Fit/Training;Manual techniques;Manual lymph drainage;Compression bandaging;Scar mobilization;Passive range of motion;Taping;Therapeutic activities   PT Next Visit Plan Cont manual lymph drainage for right breast and UE, as well as instruction in same- eventually add LE strengthening exercises or encourage pt to continue exercises issued by home health   Consulted and Agree with Plan of Care Patient      Patient will benefit from skilled therapeutic intervention in order to improve the following deficits and impairments:  Increased edema, Decreased knowledge of precautions, Decreased knowledge of use of DME, Pain, Decreased range of motion, Decreased strength  Visit Diagnosis: Lymphedema, not elsewhere classified     Problem List Patient Active Problem List   Diagnosis Date Noted  . Gait abnormality 05/29/2016  . Encephalopathy acute 04/23/2016  . Hyponatremia 04/23/2016  . Schizophrenia, paranoid ([place])   . Aspiration pneumonia of left lower lobe due to gastric secretions ([place])   . Projectile vomiting with nausea   . Altered mental status   . Schizophrenia ([place])   . Disorganized schizophrenia ([place])   . Chronic pain syndrome   . Acute renal failure ([place])   . Controlled diabetes mellitus type 2 with complications ([place])   . Acute encephalopathy   . Oropharyngeal dysphagia   . Aspiration pneumonia of right lower lobe due to vomit  ([place])   . Acute renal failure (ARF) ([place]) 03/28/2016  . Encephalopathy 03/27/2016  . HCAP (healthcare-associated pneumonia) 02/29/2016  . Pneumonia 02/14/2016  . CAP (community acquired pneumonia) 02/13/2016  . Edema 02/13/2016  . AKI (acute kidney injury) ([place]) 02/13/2016  . Elevated troponin 06/24/2015  . Diarrhea 06/23/2015  . Sepsis ([place]) 06/23/2015  . Obesity, morbid, BMI 50 or higher ([place]) 03/26/2015  . Post traumatic stress disorder (PTSD) 11/14/2014  . Leukocytosis 11/12/2014  . Slurred speech 11/12/2014  . CKD (chronic kidney disease) stage 3, GFR 30-59 ml/min   . Diabetes mellitus, type II, insulin dependent ([place])   . TIA (transient ischemic attack)   . Hypotension 10/28/2014  . Syncope 10/28/2014  . Anemia 10/28/2014  . Type II diabetes mellitus ([place]) 10/28/2014  . OSA on CPAP 10/28/2014  . Chronic lower back pain 10/28/2014  . Polyneuropathy in diabetes(357.2) 10/28/2014  . Diabetic polyneuropathy ([place])   . Fall 08/14/2014  . Recurrent falls 08/14/2014  . Hot flashes 02/24/2014  . Arthralgia 02/24/2014  . Hair loss 02/24/2014  . Chest pain 01/06/2014  . Shock ([place]) 08/08/2013  . 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 Dysarthria 12/30/2012  . Benign paroxysmal positional vertigo 12/30/2012  . Breast cancer of upper-outer quadrant of right female breast ([place]) 11/18/2012  . Hemorrhage of rectum and anus 09/28/2012  . Hx of radiation therapy   . Syncope and collapse 06/04/2011  . HSV 10/05/2008  . Asthma 06/02/2008  . HLD (hyperlipidemia) 06/02/2006  . Sinus tachycardia 05/25/2006  . Hypothyroidism 02/27/2006  . Obesity 02/27/2006  . Depression 02/27/2006  . Essential hypertension 02/27/2006  . GERD 02/27/2006  . KNEE PAIN 02/27/2006    [facility] 08/19/2016,  5:00 PM  [place], [place], [address] Phone: [phone]   Fax:  [phone]  Name: [name] MRN: [id number] Date of Birth: 06-Jul-1955  Manus Gunning, PT 08/19/16 5:02 PM

## 2016-08-21 ENCOUNTER — Ambulatory Visit: Payer: Medicare Other | Admitting: Physical Therapy

## 2016-08-21 DIAGNOSIS — I890 Lymphedema, not elsewhere classified: Secondary | ICD-10-CM | POA: Diagnosis not present

## 2016-08-21 DIAGNOSIS — M25611 Stiffness of right shoulder, not elsewhere classified: Secondary | ICD-10-CM

## 2016-08-21 DIAGNOSIS — M6281 Muscle weakness (generalized): Secondary | ICD-10-CM

## 2016-08-21 NOTE — Therapy (Signed)
[place], [place], [address] Phone: [phone]   Fax:  [phone]  Physical Therapy Treatment  Patient Details  Name: [name] MRN: [id number] Date of Birth: Oct 26, 1955 Referring Provider: Magrinat  Encounter Date: 08/21/2016      PT End of Session - 08/21/16 1600    Visit Number 7   Number of Visits 10   Date for PT Re-Evaluation 08/25/16   PT Start Time 5638   PT Stop Time 1555   PT Time Calculation (min) 40 min   Activity Tolerance Patient tolerated treatment well   Behavior During Therapy [facility] for tasks assessed/performed      Past Medical History:  Diagnosis Date  . Anemia   . Anxiety   . Arthritis    "pretty much all my joints"  . Asthma   . Benign paroxysmal positional vertigo 12/30/2012  . Breast cancer ([place]) 02/13/12   ruq  100'clock bx Ductal Carcinoma in Situ,(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  . Breast mass in female    L breast 2008, US showed likely fat necrosis  . Chronic lower back pain   . CKD (chronic kidney disease), stage III    "lower stage" (01/06/2014)  . Daily headache   . Depression   . Gait abnormality 05/29/2016  . GERD (gastroesophageal reflux disease)   . History of blood transfusion 2011   "after one of my OR's"  . History of hiatal hernia   . History of stomach ulcers   . HSV (herpes simplex virus) infection   . Hx of radiation therapy 05/05/12- 07/15/12   right breast, 45 gray x 25 fx, lumpectomy cavity boosted to 16.2 gray  . Hyperlipemia   . Hypertension    sees Dr. Criss Rosales , [facility] [place]  . Hypothyroidism   . Kidney stones   . Knee pain, bilateral   . Migraines    "~ 3 times/month" (01/06/2014)  . Obesity   . OSA on CPAP    pt does not know settings  . Pancreatitis    hx of  . Personal history of radiation therapy 02/2012  . Pneumonia 1950's  . Polyneuropathy in diabetes(357.2)   . Schizophrenia ([place])   . 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 Tachycardia    with sx in 2008  . Thyroid disease   . Type II diabetes mellitus ([place])     Past Surgical History:  Procedure Laterality Date  . ABDOMINAL HYSTERECTOMY  1979?   partial  . BREAST LUMPECTOMY  03/03/2012   Procedure: LUMPECTOMY;  Surgeon: Haywood Lasso, MD;  Location: [facility];  Service: General;  Laterality: Right;  . BREAST SURGERY Right 01/2012   "cancer"  . CHOLECYSTECTOMY  1980's?  . COLONOSCOPY WITH PROPOFOL N/A 09/28/2012   Procedure: COLONOSCOPY WITH PROPOFOL;  Surgeon: Lear Ng, MD;  Location: [facility];  Service: Endoscopy;  Laterality: N/A;  . FOOT FRACTURE SURGERY Right 1990's  . JOINT REPLACEMENT    . REVISION TOTAL KNEE ARTHROPLASTY  07/2009  . SHOULDER OPEN ROTATOR CUFF REPAIR Left 1990's  . THYROIDECTOMY  1970's  . TOTAL KNEE ARTHROPLASTY Bilateral 2009-06/2009   left; right    There were no vitals filed for this visit.      Subjective Assessment - 08/21/16 1518    Subjective Pt states he pain was really bad and the treatment helped .  Seh also reports she is having a lot of vertigo and has not been able to get medicine  for it.  Pt comes in wearing her [place] bra and serratus pad and states she wears it every day.  She says she notices her breast is softer, but it feels sore.    Pertinent History (1)  status post right lumpectomy on 03/03/2012 for a 2.8 cm, grade 1 ductal carcinoma in situ, estrogen receptor 100% positive, progesterone receptor 100% positive, with clear margins.DM and HTN; (2)  completed radiation therapy under the care of Dr. Sondra Come 07/15/2012.  She does not currently have her HTN meds. Fell in November and sustained a hairline fracture in some UE bone so she is wearing a velcro wrist brace currently. Is on Lyrica for neuropathy from diabetes. h/o left RTC repair in about 2000.   Patient Stated Goals to be able to do self massage   Currently in Pain? Yes   Pain Score 10-Worst pain ever   Pain Location Back  down into  left leg    Pain Orientation Lower   Pain Descriptors / Indicators Sharp   Pain Type Chronic pain   Pain Radiating Towards down into left leg    Pain Onset More than a month ago   Pain Frequency Constant   Aggravating Factors  sitting and walking    Pain Relieving Factors rest                         [facility] Adult PT Treatment/Exercise - 08/21/16 0001      Manual Therapy   Manual Lymphatic Drainage (MLD) In Supine with HOB slightly elevated: Short neck, 5 diaphragmatic breaths, Rt inguinal and Lt axillary nodes, Rt axillo-inguinal and anterior inter-axillary anastomosis then Rt breast, and briefly to Rt UE from lateral shoulder to hand working from proximal to distal then retracing all steps while educating pt throughout  Pt to partial sidelying for posterior interaxillary anastamosis.                         Long Term Clinic Goals - 07/28/16 1601      CC Long Term Goal  #1   Title Patient will be independent in self-manual lymph drainage for right breast and right UE.   Time 4   Period Weeks   Status New     CC Long Term Goal  #2   Title Patient will report at least 40% improvement in perception of hard spots and discomfort in right breast.   Time 4   Period Weeks   Status New     CC Long Term Goal  #3   Title Pt. will be knowledgeable about compression garments to assist with management of lymphedema (bras and possibly a sleeve).   Time 4   Period Weeks   Status New     CC Long Term Goal  #4   Title lymphedema life impact scale score reduced to impairment rating of 20% or less   Baseline score of 21 = 31% impairment at eval   Time 4   Period Weeks   Status New     CC Long Term Goal  #5   Title Pt will be independent in a home exercise program for LE strengthening and UE strenthening and ROM   Time 4   Period Weeks   Status New            Plan - 08/21/16 1601    Clinical Impression Statement Pt is having good results with MLD  and compression bra  with serratus pad.  She was able to don and doff the bra by herself today.  She reports she has a nighttime garment for her arm swelling on order.     Rehab Potential Good   Clinical Impairments Affecting Rehab Potential hx of radiation, severe pain due to herniated discs in back, pt reports she is having a hard time coming off her one of her prescriptions and sometimes she hears and sees things   PT Frequency 2x / week   PT Duration 4 weeks   PT Treatment/Interventions ADLs/Self Care Home Management;DME Instruction;Therapeutic exercise;Patient/family education;Orthotic Fit/Training;Manual techniques;Manual lymph drainage;Compression bandaging;Scar mobilization;Passive range of motion;Taping;Therapeutic activities   PT Next Visit Plan Review self manual lymph draiange and assess goals. Add LE exercise for goal achievement  Possible dischrarge   Consulted and Agree with Plan of Care Patient      Patient will benefit from skilled therapeutic intervention in order to improve the following deficits and impairments:  Increased edema, Decreased knowledge of precautions, Decreased knowledge of use of DME, Pain, Decreased range of motion, Decreased strength  Visit Diagnosis: Lymphedema, not elsewhere classified  Stiffness of right shoulder, not elsewhere classified  Muscle weakness (generalized)     Problem List Patient Active Problem List   Diagnosis Date Noted  . Gait abnormality 05/29/2016  . Encephalopathy acute 04/23/2016  . Hyponatremia 04/23/2016  . Schizophrenia, paranoid ([place])   . Aspiration pneumonia of left lower lobe due to gastric secretions ([place])   . Projectile vomiting with nausea   . Altered mental status   . Schizophrenia ([place])   . Disorganized schizophrenia ([place])   . Chronic pain syndrome   . Acute renal failure ([place])   . Controlled diabetes mellitus type 2 with complications ([place])   . Acute encephalopathy   . Oropharyngeal dysphagia   .  Aspiration pneumonia of right lower lobe due to vomit ([place])   . Acute renal failure (ARF) ([place]) 03/28/2016  . Encephalopathy 03/27/2016  . HCAP (healthcare-associated pneumonia) 02/29/2016  . Pneumonia 02/14/2016  . CAP (community acquired pneumonia) 02/13/2016  . Edema 02/13/2016  . AKI (acute kidney injury) ([place]) 02/13/2016  . Elevated troponin 06/24/2015  . Diarrhea 06/23/2015  . Sepsis ([place]) 06/23/2015  . Obesity, morbid, BMI 50 or higher ([place]) 03/26/2015  . Post traumatic stress disorder (PTSD) 11/14/2014  . Leukocytosis 11/12/2014  . Slurred speech 11/12/2014  . CKD (chronic kidney disease) stage 3, GFR 30-59 ml/min   . Diabetes mellitus, type II, insulin dependent ([place])   . TIA (transient ischemic attack)   . Hypotension 10/28/2014  . Syncope 10/28/2014  . Anemia 10/28/2014  . Type II diabetes mellitus ([place]) 10/28/2014  . OSA on CPAP 10/28/2014  . Chronic lower back pain 10/28/2014  . Polyneuropathy in diabetes(357.2) 10/28/2014  . Diabetic polyneuropathy ([place])   . Fall 08/14/2014  . Recurrent falls 08/14/2014  . Hot flashes 02/24/2014  . Arthralgia 02/24/2014  . Hair loss 02/24/2014  . Chest pain 01/06/2014  . Shock ([place]) 08/08/2013  . 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 Dysarthria 12/30/2012  . Benign paroxysmal positional vertigo 12/30/2012  . Breast cancer of upper-outer quadrant of right female breast ([place]) 11/18/2012  . Hemorrhage of rectum and anus 09/28/2012  . Hx of radiation therapy   . Syncope and collapse 06/04/2011  . HSV 10/05/2008  . Asthma 06/02/2008  . HLD (hyperlipidemia) 06/02/2006  . Sinus tachycardia 05/25/2006  . Hypothyroidism 02/27/2006  . Obesity 02/27/2006  . Depression 02/27/2006  . Essential hypertension 02/27/2006  .  GERD 02/27/2006  . KNEE PAIN 02/27/2006   Donato Heinz. Owens Shark PT  [facility] Levo 08/21/2016, 4:04 PM  [place], [place],  [address] Phone: [phone]   Fax:  [phone]  Name: [name] MRN: [id number] Date of Birth: Oct 26, 1955

## 2016-08-22 ENCOUNTER — Encounter: Payer: Self-pay | Admitting: Endocrinology

## 2016-08-22 ENCOUNTER — Ambulatory Visit (INDEPENDENT_AMBULATORY_CARE_PROVIDER_SITE_OTHER): Payer: Medicare Other | Admitting: Endocrinology

## 2016-08-22 VITALS — BP 132/80 | HR 103 | Ht 65.0 in | Wt 322.0 lb

## 2016-08-22 DIAGNOSIS — E890 Postprocedural hypothyroidism: Secondary | ICD-10-CM | POA: Diagnosis not present

## 2016-08-22 LAB — T3, FREE: T3 FREE: 2.2 pg/mL — AB (ref 2.3–4.2)

## 2016-08-22 LAB — VITAMIN D 25 HYDROXY (VIT D DEFICIENCY, FRACTURES): VITD: 43.39 ng/mL (ref 30.00–100.00)

## 2016-08-22 LAB — TSH: TSH: 1.63 u[IU]/mL (ref 0.35–4.50)

## 2016-08-22 LAB — T4, FREE: Free T4: 0.88 ng/dL (ref 0.60–1.60)

## 2016-08-22 NOTE — Patient Instructions (Addendum)
Thyroid blood tests are requested for you today.  We'll let you know about the results. I would be happy to see you back here as needed.      Levothyroxine oral capsules What is this medicine? LEVOTHYROXINE (lee voe thye ROX een) is a thyroid hormone. This medicine can improve symptoms of thyroid deficiency such as slow speech, lack of energy, weight gain, hair loss, dry skin, and feeling cold. It also helps to treat goiter (an enlarged thyroid gland). It is also used to treat some kinds of thyroid cancer along with surgery and other medicines. This medicine may be used for other purposes; ask your health care provider or pharmacist if you have questions. COMMON BRAND NAME(S): Tirosint What should I tell my health care provider before I take this medicine? They need to know if you have any of these conditions: -angina -blood clotting problems -diabetes -dieting or on a weight loss program -fertility problems -heart disease -high levels of thyroid hormone -pituitary gland problem -previous heart attack -an unusual or allergic reaction to levothyroxine, thyroid hormones, other medicines, foods, dyes, or preservatives -pregnant or trying to get pregnant -breast-feeding How should I use this medicine? Take this medicine by mouth with a glass of water. It is best to take on an empty stomach, at least 30 minutes to one hour before breakfast. Avoid taking antacids containing aluminum or magnesium, simethicone, bile acid sequestrants, calcium carbonate, sodium polystyrene sulfonate, ferrous sulfate, and sucralfate within 4 hours of taking this medicine. Do not cut, crush or chew this medicine. Follow the directions on the prescription label. Take at the same time each day. Do not take your medicine more often than directed. Talk to your pediatrician regarding the use of this medicine in children. While this drug may be prescribed for selected conditions, precautions do apply. Since the capsules  cannot be crushed or placed in water, they may only be given to infants and children who are able to swallow an intact capsule. Overdosage: If you think you have taken too much of this medicine contact a poison control center or emergency room at once. NOTE: This medicine is only for you. Do not share this medicine with others. What if I miss a dose? If you miss a dose, take it as soon as you can. If it is almost time for your next dose, take only that dose. Do not take double or extra doses. What may interact with this medicine? -amiodarone -antacids -anti-thyroid medicines -calcium supplements -carbamazepine -cholestyramine -colestipol -digoxin -female hormones, including contraceptive or birth control pills -iron supplements -ketamine -liquid nutrition products like Ensure -medicines for colds and breathing difficulties -medicines for diabetes -medicines for mental depression -medicines or herbals used to decrease weight or appetite -phenobarbital or other barbiturate medications -phenytoin -prednisone or other corticosteroids -rifabutin -rifampin -simethicone -sodium polystyrene sulfonate -soy isoflavones -sucralfate -theophylline -warfarin This list may not describe all possible interactions. Give your health care provider a list of all the medicines, herbs, non-prescription drugs, or dietary supplements you use. Also tell them if you smoke, drink alcohol, or use illegal drugs. Some items may interact with your medicine. What should I watch for while using this medicine? Do not switch brands of this medicine unless your health care professional agrees with the change. Ask questions if you are uncertain. You will need regular exams and occasional blood tests to check the response to treatment. If you are receiving this medicine for an underactive thyroid, it may be several weeks before you notice an  improvement. Check with your doctor or health care professional if your  symptoms do not improve. It may be necessary for you to take this medicine for the rest of your life. Do not stop using this medicine unless your doctor or health care professional advises you to. This medicine can affect blood sugar levels. If you have diabetes, check your blood sugar as directed. You may lose some of your hair when you first start treatment. With time, this usually corrects itself. If you are going to have surgery, tell your doctor or health care professional that you are taking this medicine. What side effects may I notice from receiving this medicine? Side effects that you should report to your doctor or health care professional as soon as possible: -allergic reactions like skin rash, itching or hives, swelling of the face, lips, or tongue -chest pain -excessive sweating or intolerance to heat -fast or irregular heartbeat -nervousness -swelling of ankles, feet, or legs -tremors Side effects that usually do not require medical attention (report to your doctor or health care professional if they continue or are bothersome): -changes in appetite -changes in menstrual periods -diarrhea -hair loss -headache -trouble sleeping -weight loss This list may not describe all possible side effects. Call your doctor for medical advice about side effects. You may report side effects to FDA at 1-800-FDA-1088. Where should I keep my medicine? Keep out of the reach of children. Store at room temperature between 15 and 30 degrees C (59 and 86 degrees F). Protect from light and moisture. Keep container tightly closed. Throw away any unused medicine after the expiration date. NOTE: This sheet is a summary. It may not cover all possible information. If you have questions about this medicine, talk to your doctor, pharmacist, or health care provider.  2018 Elsevier/Gold Standard (2015-03-08 09:44:55)

## 2016-08-22 NOTE — Progress Notes (Signed)
Subjective:    Patient ID: [name], female    DOB: Jul 08, 1955, 61 y.o.   MRN: [id number]  HPI Pt had thyroidectomy in approx 1988, for a goiter.  She says no cancer was found.  she has never been on prescribed thyroid hormone therapy.  she has never taken kelp or any other type of non-prescribed thyroid product.  She has not recently had thyroid imaging.  She has never had XRT to the neck.  she has never been on amiodarone or lithium.  She has been on her current 150 mcg/day, for more than 1 year.  She reports moderate hair loss on the head, and assoc fatigue.  Pt says she does not know why TSH fluctuates.  She says she takes synthroid as rx'ed.   Past Medical History:  Diagnosis Date  . Anemia   . Anxiety   . Arthritis    "pretty much all my joints"  . Asthma   . Benign paroxysmal positional vertigo 12/30/2012  . Breast cancer ([place]) 02/13/12   ruq  100'clock bx Ductal Carcinoma in Situ,(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  . Breast mass in female    L breast 2008, US showed likely fat necrosis  . Chronic lower back pain   . CKD (chronic kidney disease), stage III    "lower stage" (01/06/2014)  . Daily headache   . Depression   . Gait abnormality 05/29/2016  . GERD (gastroesophageal reflux disease)   . History of blood transfusion 2011   "after one of my OR's"  . History of hiatal hernia   . History of stomach ulcers   . HSV (herpes simplex virus) infection   . Hx of radiation therapy 05/05/12- 07/15/12   right breast, 45 gray x 25 fx, lumpectomy cavity boosted to 16.2 gray  . Hyperlipemia   . Hypertension    sees Dr. Criss Rosales , [facility]  . Hypothyroidism   . Kidney stones   . Knee pain, bilateral   . Migraines    "~ 3 times/month" (01/06/2014)  . Obesity   . OSA on CPAP    pt does not know settings  . Pancreatitis    hx of  . Personal history of radiation therapy 02/2012  . Pneumonia 1950's  . Polyneuropathy in diabetes(357.2)   . Schizophrenia ([place])   . Secondary  parkinsonism ([place]) 12/30/2012  . Tachycardia    with sx in 2008  . Thyroid disease   . Type II diabetes mellitus ([place])     Past Surgical History:  Procedure Laterality Date  . ABDOMINAL HYSTERECTOMY  1979?   partial  . BREAST LUMPECTOMY  03/03/2012   Procedure: LUMPECTOMY;  Surgeon: Haywood Lasso, MD;  Location: [facility];  Service: General;  Laterality: Right;  . BREAST SURGERY Right 01/2012   "cancer"  . CHOLECYSTECTOMY  1980's?  . COLONOSCOPY WITH PROPOFOL N/A 09/28/2012   Procedure: COLONOSCOPY WITH PROPOFOL;  Surgeon: Lear Ng, MD;  Location: [facility];  Service: Endoscopy;  Laterality: N/A;  . FOOT FRACTURE SURGERY Right 1990's  . JOINT REPLACEMENT    . REVISION TOTAL KNEE ARTHROPLASTY  07/2009  . SHOULDER OPEN ROTATOR CUFF REPAIR Left 1990's  . THYROIDECTOMY  1970's  . TOTAL KNEE ARTHROPLASTY Bilateral 2009-06/2009   left; right    Social History   Social History  . Marital status: Divorced    Spouse name: N/A  . Number of children: 2  . Years of education: 12   Occupational History  .  Disabled    Social History Main Topics  . Smoking status: Former Smoker    Packs/day: 1.00    Years: 7.00    Types: Cigarettes    Quit date: 03/24/1992  . Smokeless tobacco: Never Used  . 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 Drug use: No  . Sexual activity: No   Other Topics Concern  . Not on file   Social History Narrative   Single. Disabled Biochemist, clinical.          Patient is right handed.    Denies caffeine use     Current Outpatient Prescriptions on File Prior to Visit  Medication Sig Dispense Refill  . acetaminophen (TYLENOL) 650 MG CR tablet Take 650 mg by mouth every 4 (four) hours. Per SO    . albuterol (PROAIR HFA) 108 (90 Base) MCG/ACT inhaler Inhale 2 puffs into the lungs every 4 (four) hours as needed for wheezing or shortness of breath.     [name] aspirin EC 81 MG tablet Take 1 tablet (81 mg total) by  mouth daily. 30 tablet 0  . B Complex-C (B-COMPLEX WITH VITAMIN C) tablet Take 1 tablet by mouth daily.    . Cholecalciferol (VITAMIN D-3) 1000 units CAPS Take 1,000 Units by mouth daily.    [name] esomeprazole (NEXIUM) 40 MG capsule Take 1 capsule (40 mg total) by mouth daily before breakfast. 30 capsule 1  . furosemide (LASIX) 40 MG tablet Take 1 tablet (40 mg total) by mouth daily. 30 tablet 0  . insulin aspart (NOVOLOG) 100 UNIT/ML injection Inject 0-9 Units into the skin 3 (three) times daily with meals. 0-9 Units, Subcutaneous CBG < 70: implement hypoglycemia protocol CBG 70 - 120: 0 units CBG 121 - 150: 1 unit CBG 151 - 200: 2 units CBG 201 - 250: 3 units CBG 251 - 300: 5 units CBG 301 - 350: 7 units CBG 351 - 400: 9 units CBG > 400: call MD 10 mL 11  . Insulin Detemir (LEVEMIR FLEXTOUCH) 100 UNIT/ML Pen Inject 10 Units into the skin at bedtime.     [name] JANUMET 50-500 MG tablet Take 1 tablet by mouth daily.    [name] levothyroxine (SYNTHROID, LEVOTHROID) 150 MCG tablet Take 150 mcg by mouth daily before breakfast.    . linaclotide (LINZESS) 290 MCG CAPS capsule Take 290 mcg by mouth daily before breakfast.    . LORazepam (ATIVAN) 1 MG tablet Take 0.25 mg by mouth 2 (two) times daily. 57m in morning and 2 mg in the evening     . LYRICA 225 MG capsule Take 225 mg by mouth daily.  1  . Multiple Vitamin (MULTIVITAMIN WITH MINERALS) TABS tablet Take 1 tablet by mouth daily.    . rosuvastatin (CRESTOR) 20 MG tablet Take 20 mg by mouth daily.    .Marland KitchentiZANidine (ZANAFLEX) 2 MG tablet Take 2 mg by mouth.    . valsartan-hydrochlorothiazide (DIOVAN-HCT) 160-12.5 MG tablet Take 1 tablet by mouth.    . venlafaxine XR (EFFEXOR-XR) 150 MG 24 hr capsule Take 150 mg by mouth 2 (two) times daily.    . ziprasidone (GEODON) 20 MG capsule Take 1 capsule (20 mg total) by mouth 2 (two) times daily with a meal.     No current facility-administered medications on file prior to visit.     Allergies  Allergen  Reactions  . Bee Venom Anaphylaxis  . Shellfish Allergy Anaphylaxis  . Buprenorphine  Hcl Other (See Comments)    Overly sedated    Family History  Problem Relation Age of Onset  . Heart disease Brother        Multiple MIs, starting in his 76s  . Diabetes Brother   . Thyroid disease Brother   . Hypertension Mother   . Diabetes Mother   . Breast cancer Mother 74  . Bone cancer Mother   . Hypertension Sister   . Diabetes Sister   . Breast cancer Sister 55  . Thyroid disease Sister   . Breast cancer Maternal Grandmother   . Heart disease Maternal Grandmother   . Uterine cancer Other 19  . Breast cancer Paternal Aunt 39  . Breast cancer Paternal Grandmother        dx in her 52s  . Prostate cancer Paternal Grandfather     BP 132/80   Pulse (!) 103   Ht _0  (1.651 m)   Wt (!) 322 lb (146.1 kg)   SpO2 96%   BMI 53.58 kg/m    Review of Systems She has insomnia, doe, weight gain, cold intolerance, dry skin, easy bruising, myalgias, numbness of the feet, and leg cramps.  Depression is well-controlled.  denies constipation, rhinorrhea, and syncope.  No change in chronic blurry vision.     Objective:   Physical Exam VS: see vs page GEN: no distress HEAD: head: no deformity eyes: no periorbital swelling; slight bilat proptosis (R>L).  external nose and ears are normal mouth: no lesion seen NECK: a healed scar is present.  I do not appreciate a nodule in the thyroid or elsewhere in the neck CHEST WALL: no deformity LUNGS: clear to auscultation CV: reg rate and rhythm, no murmur ABD: abdomen is soft, nontender.  no hepatosplenomegaly.  not distended.  no hernia MUSCULOSKELETAL: muscle bulk and strength are grossly normal.  no obvious joint swelling.  gait is steady with a walker EXTEMITIES: 1+ bilat leg edema.  Old healed surgical scars (bilat TKR's).  Brace is on the right wrist PULSES: no carotid bruit NEURO:  cn 2-12 grossly intact.   readily moves all 4's.  sensation  is intact to touch on all 4's. SKIN:  Normal texture and temperature.  No rash or suspicious lesion is visible.   NODES:  None palpable at the neck PSYCH: alert, well-oriented.  Does not appear anxious nor depressed.  MRI (2018): orbits are normal.  No mention is made of the pituitary  outside test results are reviewed: TSH=1.03  I have reviewed outside records, and summarized: Pt was noted to have fluctuating TSH, and referred here.  Med compliance was a problem leading up to recent admission      Assessment & Plan:  Hypothyroidism, new to me.  Well-replaced.  No cause of fluctuating TSH is found. Encephalopathy, with recent admission for encephalopathy.  Patient Instructions  Thyroid blood tests are requested for you today.  We'll let you know about the results. I would be happy to see you back here as needed.      Levothyroxine oral capsules What is this medicine? LEVOTHYROXINE (lee voe thye ROX een) is a thyroid hormone. This medicine can improve symptoms of thyroid deficiency such as slow speech, lack of energy, weight gain, hair loss, dry skin, and feeling cold. It also helps to treat goiter (an enlarged thyroid gland). It is also used to treat some kinds of thyroid cancer along with surgery and other medicines. This medicine may be used for other purposes; ask  your health care provider or pharmacist if you have questions. COMMON BRAND NAME(S): Tirosint What should I tell my health care provider before I take this medicine? They need to know if you have any of these conditions: -angina -blood clotting problems -diabetes -dieting or on a weight loss program -fertility problems -heart disease -high levels of thyroid hormone -pituitary gland problem -previous heart attack -an unusual or allergic reaction to levothyroxine, thyroid hormones, other medicines, foods, dyes, or preservatives -pregnant or trying to get pregnant -breast-feeding How should I use this  medicine? Take this medicine by mouth with a glass of water. It is best to take on an empty stomach, at least 30 minutes to one hour before breakfast. Avoid taking antacids containing aluminum or magnesium, simethicone, bile acid sequestrants, calcium carbonate, sodium polystyrene sulfonate, ferrous sulfate, and sucralfate within 4 hours of taking this medicine. Do not cut, crush or chew this medicine. Follow the directions on the prescription label. Take at the same time each day. Do not take your medicine more often than directed. Talk to your pediatrician regarding the use of this medicine in children. While this drug may be prescribed for selected conditions, precautions do apply. Since the capsules cannot be crushed or placed in water, they may only be given to infants and children who are able to swallow an intact capsule. Overdosage: If you think you have taken too much of this medicine contact a poison control center or emergency room at once. NOTE: This medicine is only for you. Do not share this medicine with others. What if I miss a dose? If you miss a dose, take it as soon as you can. If it is almost time for your next dose, take only that dose. Do not take double or extra doses. What may interact with this medicine? -amiodarone -antacids -anti-thyroid medicines -calcium supplements -carbamazepine -cholestyramine -colestipol -digoxin -female hormones, including contraceptive or birth control pills -iron supplements -ketamine -liquid nutrition products like Ensure -medicines for colds and breathing difficulties -medicines for diabetes -medicines for mental depression -medicines or herbals used to decrease weight or appetite -phenobarbital or other barbiturate medications -phenytoin -prednisone or other corticosteroids -rifabutin -rifampin -simethicone -sodium polystyrene sulfonate -soy isoflavones -sucralfate -theophylline -warfarin This list may not describe all  possible interactions. Give your health care provider a list of all the medicines, herbs, non-prescription drugs, or dietary supplements you use. Also tell them if you smoke, drink alcohol, or use illegal drugs. Some items may interact with your medicine. What should I watch for while using this medicine? Do not switch brands of this medicine unless your health care professional agrees with the change. Ask questions if you are uncertain. You will need regular exams and occasional blood tests to check the response to treatment. If you are receiving this medicine for an underactive thyroid, it may be several weeks before you notice an improvement. Check with your doctor or health care professional if your symptoms do not improve. It may be necessary for you to take this medicine for the rest of your life. Do not stop using this medicine unless your doctor or health care professional advises you to. This medicine can affect blood sugar levels. If you have diabetes, check your blood sugar as directed. You may lose some of your hair when you first start treatment. With time, this usually corrects itself. If you are going to have surgery, tell your doctor or health care professional that you are taking this medicine. What side effects may I notice  from receiving this medicine? Side effects that you should report to your doctor or health care professional as soon as possible: -allergic reactions like skin rash, itching or hives, swelling of the face, lips, or tongue -chest pain -excessive sweating or intolerance to heat -fast or irregular heartbeat -nervousness -swelling of ankles, feet, or legs -tremors Side effects that usually do not require medical attention (report to your doctor or health care professional if they continue or are bothersome): -changes in appetite -changes in menstrual periods -diarrhea -hair loss -headache -trouble sleeping -weight loss This list may not describe all possible  side effects. Call your doctor for medical advice about side effects. You may report side effects to FDA at 1-800-FDA-1088. Where should I keep my medicine? Keep out of the reach of children. Store at room temperature between 15 and 30 degrees C (59 and 86 degrees F). Protect from light and moisture. Keep container tightly closed. Throw away any unused medicine after the expiration date. NOTE: This sheet is a summary. It may not cover all possible information. If you have questions about this medicine, talk to your doctor, pharmacist, or health care provider.  2018 Elsevier/Gold Standard (2015-03-08 09:44:55)

## 2016-08-25 ENCOUNTER — Ambulatory Visit: Payer: Medicare Other | Admitting: Physical Therapy

## 2016-08-25 ENCOUNTER — Other Ambulatory Visit: Payer: Self-pay | Admitting: Endocrinology

## 2016-08-25 ENCOUNTER — Encounter: Payer: Self-pay | Admitting: Physical Therapy

## 2016-08-25 DIAGNOSIS — I890 Lymphedema, not elsewhere classified: Secondary | ICD-10-CM | POA: Diagnosis not present

## 2016-08-25 LAB — PTH, INTACT AND CALCIUM
CALCIUM: 8.4 mg/dL — AB (ref 8.6–10.4)
PTH: 80 pg/mL — AB (ref 14–64)

## 2016-08-25 MED ORDER — CALCITRIOL 0.25 MCG PO CAPS: 0.2500 ug | ORAL_CAPSULE | ORAL | 5 refills | Status: DC

## 2016-08-25 NOTE — Therapy (Signed)
[place], [place], [address] Phone: [phone]   Fax:  [phone]  Physical Therapy Treatment  Patient Details  Name: [name] MRN: [id number] Date of Birth: September 11, 1955 Referring Provider: Magrinat  Encounter Date: 08/25/2016      PT End of Session - 08/25/16 1658    Visit Number 8   Number of Visits 10   Date for PT Re-Evaluation 08/25/16   PT Start Time 1519   PT Stop Time 1602   PT Time Calculation (min) 43 min   Activity Tolerance Patient tolerated treatment well   Behavior During Therapy [facility] for tasks assessed/performed      Past Medical History:  Diagnosis Date  . Anemia   . Anxiety   . Arthritis    "pretty much all my joints"  . Asthma   . Benign paroxysmal positional vertigo 12/30/2012  . 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  . Breast mass in female    L breast 2008, US showed likely fat necrosis  . Chronic lower back pain   . CKD (chronic kidney disease), stage III    "lower stage" (01/06/2014)  . Daily headache   . Depression   . Gait abnormality 05/29/2016  . GERD (gastroesophageal reflux disease)   . History of blood transfusion 2011   "after one of my OR's"  . History of hiatal hernia   . History of stomach ulcers   . HSV (herpes simplex virus) infection   . Hx of radiation therapy 05/05/12- 07/15/12   right breast, 45 gray x 25 fx, lumpectomy cavity boosted to 16.2 gray  . Hyperlipemia   . Hypertension    sees Dr. Criss Rosales , [facility]  . Hypothyroidism   . Kidney stones   . Knee pain, bilateral   . Migraines    "~ 3 times/month" (01/06/2014)  . Obesity   . OSA on CPAP    pt does not know settings  . Pancreatitis    hx of  . Personal history of radiation therapy 02/2012  . Pneumonia 1950's  . Polyneuropathy in diabetes(357.2)   . Schizophrenia ([place])   . 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 Tachycardia    with sx in 2008  . Thyroid disease   . Type II diabetes mellitus ([place])     Past Surgical History:  Procedure Laterality Date  . ABDOMINAL HYSTERECTOMY  1979?   partial  . BREAST LUMPECTOMY  03/03/2012   Procedure: LUMPECTOMY;  Surgeon: Haywood Lasso, MD;  Location: [facility];  Service: General;  Laterality: Right;  . BREAST SURGERY Right 01/2012   "cancer"  . CHOLECYSTECTOMY  1980's?  . COLONOSCOPY WITH PROPOFOL N/A 09/28/2012   Procedure: COLONOSCOPY WITH PROPOFOL;  Surgeon: Lear Ng, MD;  Location: [facility];  Service: Endoscopy;  Laterality: N/A;  . FOOT FRACTURE SURGERY Right 1990's  . JOINT REPLACEMENT    . REVISION TOTAL KNEE ARTHROPLASTY  07/2009  . SHOULDER OPEN ROTATOR CUFF REPAIR Left 1990's  . THYROIDECTOMY  1970's  . TOTAL KNEE ARTHROPLASTY Bilateral 2009-06/2009   left; right    There were no vitals filed for this visit.      Subjective Assessment - 08/25/16 1523    Subjective I am hurting everywhere. Right now my vertigo is okay, I will see when I lay down. This morning my right breast was really sore so after my shower I didn't  put the compression back on. I just wanted to give it some space today.    Pertinent History (1)  status post right lumpectomy on 03/03/2012 for a 2.8 cm, grade 1 ductal carcinoma in situ, estrogen receptor 100% positive, progesterone receptor 100% positive, with clear margins.DM and HTN; (2)  completed radiation therapy under the care of Dr. Sondra Come 07/15/2012.  She does not currently have her HTN meds. Fell in November and sustained a hairline fracture in some UE bone so she is wearing a velcro wrist brace currently. Is on Lyrica for neuropathy from diabetes. h/o left RTC repair in about 2000.   Patient Stated Goals to be able to do self massage   Currently in Pain? Yes   Pain Score 10-Worst pain ever   Pain Location Back  left leg, both knees   Pain Orientation Left;Right;Lower   Pain Descriptors /  Indicators Sharp;Throbbing   Pain Type Chronic pain   Pain Radiating Towards down into left leg   Pain Onset More than a month ago   Pain Frequency Constant                         [facility] Adult PT Treatment/Exercise - 08/25/16 0001      Manual Therapy   Manual Lymphatic Drainage (MLD) In Supine with HOB slightly elevated: Short neck, 5 diaphragmatic breaths, Rt inguinal and Lt axillary nodes, Rt axillo-inguinal and anterior inter-axillary anastomosis then Rt breast moving fluid towards pathways                        Long Term Clinic Goals - 08/25/16 1526      CC Long Term Goal  #1   Title Patient will be independent in self-manual lymph drainage for right breast and right UE.   Baseline 08/25/16- pt reports she feels okay with this   Time 4   Period Weeks   Status Achieved     CC Long Term Goal  #2   Title Patient will report at least 40% improvement in perception of hard spots and discomfort in right breast.   Baseline 08/25/16- 100% improvement in hardness, no improvememt in discomfort   Time 4   Period Weeks   Status Partially Met     CC Long Term Goal  #3   Title Pt. will be knowledgeable about compression garments to assist with management of lymphedema (bras and possibly a sleeve).   Baseline 08/25/16- pt has received compression bra and is wearing daily and a night time garment has been ordered for pt's arm   Time 4   Period Weeks   Status Achieved     CC Long Term Goal  #4   Title lymphedema life impact scale score reduced to impairment rating of 20% or less   Baseline score of 21 = 31% impairment at eval, 08/25/16- 71% today despite pt reporting improvements since time of evaluation   Time 4   Period Weeks   Status Not Met     CC Long Term Goal  #5   Title Pt will be independent in a home exercise program for LE strengthening and UE strenthening and ROM   Baseline 08/25/16- pt states she has these exercises from home health for UE and LE  in sitting which doesnt cause increased pain and she feels independent with them   Time 4   Period Weeks   Status Achieved  Plan - 08/25/16 1658    Clinical Impression Statement Assessed pt's progress towards goals in therapy. Pt has reached maximal benefits of therapy and is ready for discharge at this time. She demostrates softening of fibrosis in right breast and is able to manage right breast lymphedema with a compression bra. She will be receiving a night time garment for management of her R UE lymphedema. She has exercises from home health PT to follow for UE  and LE strengthening that do not cause increased pain.    Rehab Potential Good   Clinical Impairments Affecting Rehab Potential hx of radiation, severe pain due to herniated discs in back, pt reports she is having a hard time coming off her one of her prescriptions and sometimes she hears and sees things   PT Frequency 2x / week   PT Duration 4 weeks   PT Treatment/Interventions ADLs/Self Care Home Management;DME Instruction;Therapeutic exercise;Patient/family education;Orthotic Fit/Training;Manual techniques;Manual lymph drainage;Compression bandaging;Scar mobilization;Passive range of motion;Taping;Therapeutic activities   PT Next Visit Plan dc this visit   Consulted and Agree with Plan of Care Patient      Patient will benefit from skilled therapeutic intervention in order to improve the following deficits and impairments:  Increased edema, Decreased knowledge of precautions, Decreased knowledge of use of DME, Pain, Decreased range of motion, Decreased strength  Visit Diagnosis: Lymphedema, not elsewhere classified     Problem List Patient Active Problem List   Diagnosis Date Noted  . Hypocalcemia 08/22/2016  . Gait abnormality 05/29/2016  . Encephalopathy acute 04/23/2016  . Hyponatremia 04/23/2016  . Schizophrenia, paranoid ([place])   . Aspiration pneumonia of left lower lobe due to gastric secretions  ([place])   . Projectile vomiting with nausea   . Altered mental status   . Schizophrenia ([place])   . Disorganized schizophrenia ([place])   . Chronic pain syndrome   . Acute renal failure ([place])   . Controlled diabetes mellitus type 2 with complications ([place])   . Acute encephalopathy   . Oropharyngeal dysphagia   . Aspiration pneumonia of right lower lobe due to vomit ([place])   . Acute renal failure (ARF) ([place]) 03/28/2016  . Encephalopathy 03/27/2016  . HCAP (healthcare-associated pneumonia) 02/29/2016  . Pneumonia 02/14/2016  . CAP (community acquired pneumonia) 02/13/2016  . Edema 02/13/2016  . AKI (acute kidney injury) ([place]) 02/13/2016  . Elevated troponin 06/24/2015  . Diarrhea 06/23/2015  . Sepsis ([name]) 06/23/2015  . Obesity, morbid, BMI 50 or higher ([place]) 03/26/2015  . Post traumatic stress disorder (PTSD) 11/14/2014  . Leukocytosis 11/12/2014  . Slurred speech 11/12/2014  . CKD (chronic kidney disease) stage 3, GFR 30-59 ml/min   . Diabetes mellitus, type II, insulin dependent ([place])   . TIA (transient ischemic attack)   . Hypotension 10/28/2014  . Syncope 10/28/2014  . Anemia 10/28/2014  . Type II diabetes mellitus ([place]) 10/28/2014  . OSA on CPAP 10/28/2014  . Chronic lower back pain 10/28/2014  . Polyneuropathy in diabetes(357.2) 10/28/2014  . Diabetic polyneuropathy ([place])   . Fall 08/14/2014  . Recurrent falls 08/14/2014  . Hot flashes 02/24/2014  . Arthralgia 02/24/2014  . Hair loss 02/24/2014  . Chest pain 01/06/2014  . Shock ([place]) 08/08/2013  . 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 Dysarthria 12/30/2012  . Benign paroxysmal positional vertigo 12/30/2012  . Breast cancer of upper-outer quadrant of right female breast ([place]) 11/18/2012  . Hemorrhage of rectum and anus 09/28/2012  . Hx of radiation therapy   . Syncope and collapse 06/04/2011  .  HSV 10/05/2008  . Asthma 06/02/2008  . HLD (hyperlipidemia) 06/02/2006  . Sinus tachycardia 05/25/2006  .  Hypothyroidism 02/27/2006  . Obesity 02/27/2006  . Depression 02/27/2006  . Essential hypertension 02/27/2006  . GERD 02/27/2006  . KNEE PAIN 02/27/2006    Allyson Sabal [facility] 08/25/2016, 5:03 PM  [place], [place], [address] Phone: [phone]   Fax:  [phone]  Name: [name] MRN: [id number] Date of Birth: [birth year]  PHYSICAL THERAPY DISCHARGE SUMMARY  Visits from Start of Care: 8  Current functional level related to goals / functional outcomes: See above   Remaining deficits: Pt still has right breast discomfort despite reduction of fibrosis   Education / Equipment: Previous HEP from home health, compression bra, pt going to receive a night time compression garment Plan: Patient agrees to discharge.  Patient goals were partially met. Patient is being discharged due to meeting the stated rehab goals.  ?????     Allyson Sabal [place], [place] 08/25/16 5:05 PM

## 2016-09-08 DIAGNOSIS — E1140 Type 2 diabetes mellitus with diabetic neuropathy, unspecified: Secondary | ICD-10-CM | POA: Insufficient documentation

## 2016-09-28 ENCOUNTER — Encounter: Payer: Self-pay | Admitting: Neurology

## 2016-09-29 ENCOUNTER — Telehealth: Payer: Self-pay | Admitting: Endocrinology

## 2016-09-29 MED ORDER — LEVOTHYROXINE SODIUM 150 MCG PO TABS: 150.0000 ug | ORAL_TABLET | Freq: Every day | ORAL | 2 refills | Status: DC

## 2016-09-29 NOTE — Telephone Encounter (Signed)
MEDICATION: Levothyroxine (synthroid,levothroid) 175 mcg tab   PHARMACY: [facility]   IS THIS A 90 DAY SUPPLY : no  IS PATIENT OUT OF MEDICTAION: yes  IF NOT; HOW MUCH IS LEFT:   LAST APPOINTMENT DATE: 08/22/2016  NEXT APPOINTMENT DATE:  OTHER COMMENTS: Dr. Loanne Drilling has never prescribed this med for the patient.    **Let patient know to contact pharmacy at the end of the day to make sure medication is ready. **  ** Please notify patient to allow 48-72 hours to process**  **Encourage patient to contact the pharmacy for refills or they can request refills through [facility]**

## 2016-09-29 NOTE — Telephone Encounter (Signed)
Refill submitted.

## 2016-09-30 ENCOUNTER — Encounter: Payer: Self-pay | Admitting: Neurology

## 2016-09-30 ENCOUNTER — Ambulatory Visit (INDEPENDENT_AMBULATORY_CARE_PROVIDER_SITE_OTHER): Payer: Medicare Other | Admitting: Neurology

## 2016-09-30 ENCOUNTER — Encounter (INDEPENDENT_AMBULATORY_CARE_PROVIDER_SITE_OTHER): Payer: Self-pay

## 2016-09-30 VITALS — BP 127/73 | HR 110 | Ht 65.0 in

## 2016-09-30 DIAGNOSIS — Z9989 Dependence on other enabling machines and devices: Secondary | ICD-10-CM | POA: Diagnosis not present

## 2016-09-30 DIAGNOSIS — G4733 Obstructive sleep apnea (adult) (pediatric): Secondary | ICD-10-CM

## 2016-09-30 NOTE — Patient Instructions (Addendum)
You have done a good job with your CPAP! Please continue using your CPAP regularly. While your insurance requires that you use CPAP at least 4 hours each night on 70% of the nights, I recommend, that you not skip any nights and use it throughout the night if you can. Getting used to CPAP and staying with the treatment long term does take time and patience and discipline. Untreated obstructive sleep apnea when it is moderate to severe can have an adverse impact on cardiovascular health and raise her risk for heart disease, arrhythmias, hypertension, congestive heart failure, stroke and diabetes. Untreated obstructive sleep apnea causes sleep disruption, nonrestorative sleep, and sleep deprivation. This can have an impact on your day to day functioning and cause daytime sleepiness and impairment of cognitive function, memory loss, mood disturbance, and problems focussing. Using CPAP regularly can improve these symptoms.  I would like to try you on a slightly higher pressure to see if we can optimize your settings and bring down your sleep apnea score a little more.  I would like for you to get in touch your DME company, [facility], to get a better mask fit or to try a chinstrap.  Please make an appt in 1 month, you can see one of our nurse practitioners. You don't have to bring your CPAP next time.

## 2016-09-30 NOTE — Progress Notes (Signed)
Subjective:    Patient ID: [name] is a 61 y.o. female.  HPI     Interim history:   [name] is a 61-year-old right-handed woman with a complex medical history of breast cancer, chronic low back pain, mood disorder, reflux disease, anemia, arthritis, hyperlipidemia, hypertension, hypothyroidism, kidney stones, migraine headaches, history of pancreatitis, diabetes and morbid obesity with a BMI of over 50, who presents for follow-up consultation of her obstructive sleep apnea, after her recent split-night sleep study. The patient is unaccompanied today. I first met her on 06/30/2016 at the request of Dr. Willis, at which time she reported a prior diagnosis of obstructive sleep apnea and is using CPAP. She had an older machine. She required reevaluation as it had been possibly 20 years since her original sleep study. I advised her to proceed with sleep study testing. She had a split-night sleep study on 07/11/2016. I went over her test results with her in detail today. Baseline sleep efficiency was 79.5%, REM sleep was absent. She had a highly increased percentage of stage II sleep, absence of slow-wave sleep and REM sleep. Total AHI was 46.1 per hour, average oxygen saturation of only 89%, nadir in non-REM sleep was 81%. She had no significant PLMS during the first part of the study. She was then fitted with nasal pillows. She was titrated on CPAP from 5 cm to 10 cm. On the final pressure her AHI was suboptimal at 10.9 per hour, supine sleep achieved, O2 nadir of 92%. She had no significant PLMS. Based on her test results I prescribed CPAP therapy for home use a pressure of 11 cm as she was not fully treated on 10 cm.  Today, 09/30/2016 (all dictated new, as well as above notes, some dictation done in note pad or Word, outside of chart, may appear as copied):  I reviewed her CPAP compliance data from 07/25/2016 through 09/28/2016 which is a total of 66 days, during which time she used her CPAP  48 days with percent used days greater than 4 hours at 64%, indicating suboptimal compliance with an average usage of 6 hours and 44 minutes, residual AHI suboptimal at 13.4 per hour, leak on the high side with the 95th percentile at 35.4 L/m on a pressure of 11 cm with EPR of 2. In the past 30 days, she only used her machine 22 days, percent used days greater than 4 hours again suboptimal at 67%, average usage of 7 hours and 30 minutes, residual AHI 16.3 per hour, leak high with the 95th percentile at 42.8 L/m. She reports sleeping better with her CPAP. She could not use it when her brother was sick recently and in the hospital, sadly, he passed away since then. Also lost her aunt after that. She feels better overall with respect to her sleep. She is using nasal pillows. May open mouth at night.   The patient's allergies, current medications, family history, past medical history, past social history, past surgical history and problem list were reviewed and updated as appropriate.   Previously (copied from previous notes for reference):   06/30/2016: (She) was previously diagnosed with obstructive sleep apnea and placed on CPAP therapy. Her sleep study may have been as long as 20 years ago, her current machine is at least 60 years old. Prior  sleep study results are not available for my review today. A CPAP compliance download is not available for my review today. I reviewed your office note from 05/29/2016. Her Epworth sleepiness   score is 8 out of 24, her fatigue score is 55 out of 63. She is divorced, on disability. She lives alone. She has 2 grown children. She quit smoking in 1996 and currently does not drink any alcohol. She does not drink caffeine on a daily basis. She does not keep a very set schedule for her sleep and wake time, typically in bed around 1 AM, wake up time around 8 AM. She has nocturia about twice per average night. She wakes up with a headache occasionally. Her CPAP machine as effective  she reports. She did not bring her machine today. She came via transportation bus and is in a hurry to leave. I could not complete a full exam. She believes her DME company is [facility].  Her Past Medical History Is Significant For: Past Medical History:  Diagnosis Date  . Anemia   . Anxiety   . Arthritis    "pretty much all my joints"  . Asthma   . Benign paroxysmal positional vertigo 12/30/2012  . Breast cancer (HCC) 02/13/12   ruq  100'clock bx Ductal Carcinoma in Situ,(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  . Breast mass in female    L breast 2008, US showed likely fat necrosis  . Chronic lower back pain   . CKD (chronic kidney disease), stage III    "lower stage" (01/06/2014)  . Daily headache   . Depression   . Gait abnormality 05/29/2016  . GERD (gastroesophageal reflux disease)   . History of blood transfusion 2011   "after one of my OR's"  . History of hiatal hernia   . History of stomach ulcers   . HSV (herpes simplex virus) infection   . Hx of radiation therapy 05/05/12- 07/15/12   right breast, 45 gray x 25 fx, lumpectomy cavity boosted to 16.2 gray  . Hyperlipemia   . Hypertension    sees Dr. Bland , [place]  . Hypothyroidism   . Kidney stones   . Knee pain, bilateral   . Migraines    "~ 3 times/month" (01/06/2014)  . Obesity   . OSA on CPAP    pt does not know settings  . Pancreatitis    hx of  . Personal history of radiation therapy 02/2012  . Pneumonia 1950's  . Polyneuropathy in diabetes(357.2)   . Schizophrenia (HCC)   . 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 Tachycardia    with sx in 2008  . Thyroid disease   . Type II diabetes mellitus (HCC)     Her Past Surgical History Is Significant For: Past Surgical History:  Procedure Laterality Date  . ABDOMINAL HYSTERECTOMY  1979?   partial  . BREAST LUMPECTOMY  03/03/2012   Procedure: LUMPECTOMY;  Surgeon: Christian J Streck, MD;  Location: [facility] OR;  Service: General;  Laterality: Right;  . BREAST  SURGERY Right 01/2012   "cancer"  . CHOLECYSTECTOMY  1980's?  . COLONOSCOPY WITH PROPOFOL N/A 09/28/2012   Procedure: COLONOSCOPY WITH PROPOFOL;  Surgeon: Vincent C. Schooler, MD;  Location: [facility] END[facility]COPY;  Service: Endoscopy;  Laterality: N/A;  . FOOT FRACTURE SURGERY Right 1990's  . JOINT REPLACEMENT    . REVISION TOTAL KNEE ARTHROPLASTY  07/2009  . SHOULDER OPEN ROTATOR CUFF REPAIR Left 1990's  . THYROIDECTOMY  1970's  . TOTAL KNEE ARTHROPLASTY Bilateral 2009-06/2009   left; right    Her Family History Is Significant For: Family History  Problem Relation Age of Onset  . Heart disease Brother          Multiple MIs, starting in his 68s  . Diabetes Brother   . Thyroid disease Brother   . Hypertension Mother   . Diabetes Mother   . Breast cancer Mother 79  . Bone cancer Mother   . Hypertension Sister   . Diabetes Sister   . Breast cancer Sister 38  . Thyroid disease Sister   . Breast cancer Maternal Grandmother   . Heart disease Maternal Grandmother   . Uterine cancer Other 19  . Breast cancer Paternal Aunt 35  . Breast cancer Paternal Grandmother        dx in her 63s  . Prostate cancer Paternal Grandfather     Her Social History Is Significant For: Social History   Social History  . Marital status: Divorced    Spouse name: N/A  . Number of children: 2  . Years of education: 12   Occupational History  . Disabled    Social History Main Topics  . Smoking status: Former Smoker    Packs/day: 1.00    Years: 7.00    Types: Cigarettes    Quit date: 03/24/1992  . Smokeless tobacco: Never Used  . 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 Drug use: No  . Sexual activity: No   Other Topics Concern  . None   Social History Narrative   Single. Disabled Biochemist, clinical.          Patient is right handed.    Denies caffeine use     Her Allergies Are:  Allergies  Allergen Reactions  . Bee Venom Anaphylaxis  . Shellfish Allergy  Anaphylaxis  . Buprenorphine Hcl Other (See Comments)    Overly sedated  :   Her Current Medications Are:  Outpatient Encounter Prescriptions as of 09/30/2016  Medication Sig  . acetaminophen (TYLENOL) 650 MG CR tablet Take 650 mg by mouth every 4 (four) hours. Per SO  . albuterol (PROAIR HFA) 108 (90 Base) MCG/ACT inhaler Inhale 2 puffs into the lungs every 4 (four) hours as needed for wheezing or shortness of breath.   [name] aspirin EC 81 MG tablet Take 1 tablet (81 mg total) by mouth daily.  . B Complex-C (B-COMPLEX WITH VITAMIN C) tablet Take 1 tablet by mouth daily.  . calcitRIOL (ROCALTROL) 0.25 MCG capsule Take 1 capsule (0.25 mcg total) by mouth 3 (three) times a week.  . Cholecalciferol (VITAMIN D-3) 1000 units CAPS Take 1,000 Units by mouth daily.  [name] esomeprazole (NEXIUM) 40 MG capsule Take 1 capsule (40 mg total) by mouth daily before breakfast.  . furosemide (LASIX) 40 MG tablet Take 1 tablet (40 mg total) by mouth daily.  . insulin aspart (NOVOLOG) 100 UNIT/ML injection Inject 0-9 Units into the skin 3 (three) times daily with meals. 0-9 Units, Subcutaneous CBG < 70: implement hypoglycemia protocol CBG 70 - 120: 0 units CBG 121 - 150: 1 unit CBG 151 - 200: 2 units CBG 201 - 250: 3 units CBG 251 - 300: 5 units CBG 301 - 350: 7 units CBG 351 - 400: 9 units CBG > 400: call MD  . Insulin Detemir (LEVEMIR FLEXTOUCH) 100 UNIT/ML Pen Inject 10 Units into the skin at bedtime.   [name] JANUMET 50-500 MG tablet Take 1 tablet by mouth daily.  [name] levothyroxine (SYNTHROID, LEVOTHROID) 150 MCG tablet Take 1 tablet (150 mcg total) by mouth daily before breakfast.  . linaclotide (LINZESS) 290 MCG CAPS capsule Take 290 mcg by  mouth daily before breakfast.  . LORazepam (ATIVAN) 1 MG tablet Take 0.25 mg by mouth 2 (two) times daily. 1mg in morning and 2 mg in the evening   . LYRICA 225 MG capsule Take 225 mg by mouth daily.  . Multiple Vitamin (MULTIVITAMIN WITH MINERALS) TABS tablet Take 1 tablet by  mouth daily.  . rosuvastatin (CRESTOR) 20 MG tablet Take 20 mg by mouth daily.  . tiZANidine (ZANAFLEX) 2 MG tablet Take 2 mg by mouth.  . valsartan-hydrochlorothiazide (DIOVAN-HCT) 160-12.5 MG tablet Take 1 tablet by mouth.  . venlafaxine XR (EFFEXOR-XR) 150 MG 24 hr capsule Take 150 mg by mouth 2 (two) times daily.  . ziprasidone (GEODON) 20 MG capsule Take 1 capsule (20 mg total) by mouth 2 (two) times daily with a meal.   No facility-administered encounter medications on file as of 09/30/2016.   :  Review of Systems:  Out of a complete 14 point review of systems, all are reviewed and negative with the exception of these symptoms as listed below:  Review of Systems  Neurological:       Pt presents today to discuss her cpap. Pt was unable to use her cpap while her brother, now deceased, was in the hospital. Pt reports feeling better when she is able to use her cpap. Pt's back and left knee are hurting today.    Objective:  Neurological Exam  Physical Exam Physical Examination:   Vitals:   09/30/16 1451  BP: 127/73  Pulse: (!) 110   General Examination: The patient is a very pleasant 61 y.o. female in no acute distress. She appears well-developed and well-nourished and adequately groomed.   HEENT: Normocephalic, atraumatic, pupils are equal, round and reactive to light and accommodation. Extraocular tracking is fair. Hearing is grossly intact. Face is symmetric with normal facial animation and normal facial sensation. Speech is clear with no dysarthria noted. There is no hypophonia. There is no lip, neck/head, jaw or voice tremor. Neck is supple with full range of passive and active motion. Oropharynx exam reveals: moderate mouth dryness, marginal dental hygiene and marked airway crowding. Mallampati is class III. Tongue protrudes centrally and palate elevates symmetrically.   Chest: Clear to auscultation without wheezing, rhonchi or crackles noted.  Heart: S1+S2+0, regular and  normal without murmurs, rubs or gallops noted.   Abdomen: Soft, non-tender and non-distended with normal bowel sounds appreciated on auscultation.  Extremities: There is non pitting puffiness in the distal lower extremities bilaterally.  Skin: Warm and dry without trophic changes noted.  Musculoskeletal: exam reveals: Bilateral knee pain, left more than right and low back pain, midline.   Neurologically:  Mental status: The patient is awake, alert and oriented in all 4 spheres. Her immediate and remote memory, attention, language skills and fund of knowledge are appropriate. There is no evidence of aphasia, agnosia, apraxia or anomia. Speech is clear with normal prosody and enunciation. Mood is decreased range and affect is better than last time.    Cranial nerves II - XII are as described above under HEENT exam. Motor exam:  normal bulk strength and tone. She has trace reflexes in the not fullower extremities, 1+ in the upper extremities, fine motor skills are mildly impaired globally. She stands up slowly and relies on her rolling walker. She walks slowly and bent over the walker. Sensory exam is intact to light touch. No obvious cerebellar signs.   Assessment and Plan:  In summary, [name] is a very pleasant 61-year old   female with a complex medical history of breast cancer, chronic low back pain, mood disorder, reflux disease, anemia, arthritis, hyperlipidemia, hypertension, hypothyroidism, kidney stones, migraine headaches, history of pancreatitis, diabetes and morbid obesity with a BMI of over 50, who  presents for follow-up consultation of her obstructive sleep apnea, after recent sleep study testing confirmed severe obstructive sleep apnea. We talked about her split-night sleep study results from 07/11/2016 at length as well as her compliance today. She is commended for her compliance and also endorses improved sleep and sleep quality and sleep consolidation however, she was not  always able to use her CPAP in the recent month or so, reports having difficulty using her CPAP when her brother was sick and also her aunt passed away shortly after her brother passed away. She is encouraged to be fully compliant with treatment. I would like to increase her set pressure to 12 cm at this time and also get her to talk to her DME provider for a better mask fit as her leak is high, she is using nasal pillows and may need a chinstrap or different size of her nasal pillows or different make. Physical exam is stable. She is advised to follow-up in one month for a recheck on her compliance. I placed an order for a mask refit as well as pressure increase. In addition, she is not fully sure how to change the filter in the machine. She is advised to look for a replacement filter which she recalls she may have at home. I answered all her questions today and the patient was in agreement with the plan.  I spent 25 minutes in total face-to-face time with the patient, more than 50% of which was spent in counseling and coordination of care, reviewing test results, reviewing medication and discussing or reviewing the diagnosis of OSA its prognosis and treatment options. Pertinent laboratory and imaging test results that were available during this visit with the patient were reviewed by me and considered in my medical decision making (see chart for details).

## 2016-10-07 ENCOUNTER — Ambulatory Visit (INDEPENDENT_AMBULATORY_CARE_PROVIDER_SITE_OTHER): Payer: Medicare Other

## 2016-10-07 ENCOUNTER — Encounter (INDEPENDENT_AMBULATORY_CARE_PROVIDER_SITE_OTHER): Payer: Self-pay | Admitting: Orthopedic Surgery

## 2016-10-07 ENCOUNTER — Ambulatory Visit (INDEPENDENT_AMBULATORY_CARE_PROVIDER_SITE_OTHER): Payer: Medicare Other | Admitting: Orthopedic Surgery

## 2016-10-07 DIAGNOSIS — Z96653 Presence of artificial knee joint, bilateral: Secondary | ICD-10-CM

## 2016-10-07 DIAGNOSIS — G8929 Other chronic pain: Secondary | ICD-10-CM

## 2016-10-07 DIAGNOSIS — Z96652 Presence of left artificial knee joint: Secondary | ICD-10-CM | POA: Diagnosis not present

## 2016-10-07 DIAGNOSIS — M48062 Spinal stenosis, lumbar region with neurogenic claudication: Secondary | ICD-10-CM | POA: Diagnosis not present

## 2016-10-07 DIAGNOSIS — M25531 Pain in right wrist: Secondary | ICD-10-CM | POA: Diagnosis not present

## 2016-10-07 DIAGNOSIS — M25562 Pain in left knee: Secondary | ICD-10-CM | POA: Diagnosis not present

## 2016-10-07 NOTE — Progress Notes (Signed)
Office Visit Note   Patient: [name]           Date of Birth: 1955/12/12           MRN: [id number] Visit Date: 10/07/2016              Requested by: Levin Bacon, NP [place], [place] [address] PCP: Levin Bacon, NP  Chief Complaint  Patient presents with  . Right Wrist - Pain  . Left Knee - Pain      HPI: Patient is a 61 year old woman with progressive lower back pain with radicular symptoms down both lower extremities. A shunt is status post an MRI scan with no vomit. Patient states she has difficulty with start up difficulty even with short ambulation she uses a rolling walker. Patient states she's had right wrist pain for over a year she had fall at that time and has decreased range of motion compared to her left wrist. She is right-hand-dominant. Patient complains of some new left knee pain she states the knee feels like it is out of place and complains of swelling and giving way. Patient states she has not been doing any strengthening exercises.  Assessment & Plan: Visit Diagnoses:  1. Chronic pain of left knee   2. History of total knee arthroplasty, left   3. Pain in right wrist   4. Total knee replacement status, bilateral   5. Spinal stenosis of lumbar region with neurogenic claudication     Plan: Plan: Patient was given instructions to resume performing her knee strengthening exercises for both knees. Discussed the importance of knee strength to maintain stability of total knee arthroplasties. Discussed that there is no lucency of the components no signs of any mechanical instability. Recommended that she can try using a Velcro splint for the wrist to help with the stability. Patient states she does have a Velcro splint that she has used in the past. Her MRI scan shows significant stenosis of the lumbar spine and with patient's significant symptoms we will have her follow-up with Dr. Lorin Mercy as soon as possible for evaluation for possible surgical  intervention.  Follow-Up Instructions: Return in about 1 week (around 10/14/2016).   Ortho Exam  Patient is alert, oriented, no adenopathy, well-dressed, normal affect, normal respiratory effort. Examination patient has difficulty getting from a sitting to a standing position she uses a rolling walker she is leaned over the walker with flexion of her lumbar spine to relieve her symptoms. Patient has a positive straight leg raise bilaterally she has pain with plantarflexion and dorsiflexion of both feet there is no focal motor weakness in either lower extremity. Examination the left knee she does have some laxity she has full extension and flexion to 100 patella tracks midline. There is no redness no cellulitis no signs of infection. Examination of right wrist she does have active range of motion of all fingers. She lacks about 20 of dorsiflexion compared to her left wrist. She is globally tender to palpation there is no focal pain of the carpometacarpal joint of the right thumb scapholunate lunate and TFCC are nontender to palpation. The first dorsal extensor compartment is nontender to palpation her hand is neurovascularly intact.  Imaging: Xr Knee 1-2 Views Left  Result Date: 10/07/2016 Two-view radiographs of the left knee show evidence stable fixation of the total knee implants. She has a little bit oh widening of the lateral collateral ligament of the left knee.  Xr Wrist 2 Views  Right  Result Date: 10/07/2016 Two-view radiographs of the right wrist shows some carpal metacarpal arthritis the base of the right thumb there is no intercalary instability.  No images are attached to the encounter.  Labs: Lab Results  Component Value Date   HGBA1C 6.1 (H) 02/16/2016   HGBA1C 7.3 (H) 06/23/2015   HGBA1C 7.5 (H) 10/28/2014   ESRSEDRATE 42 (H) 05/12/2013   REPTSTATUS 03/29/2016 FINAL 03/27/2016   CULT NO GROWTH 03/27/2016    Orders:  Orders Placed This Encounter  Procedures  . XR Knee  1-2 Views Left  . XR Wrist 2 Views Right   No orders of the defined types were placed in this encounter.    Procedures: No procedures performed  Clinical Data: No additional findings.  ROS:  All other systems negative, except as noted in the HPI. Review of Systems  Objective: Vital Signs: There were no vitals taken for this visit.  Specialty Comments:  No specialty comments available.  PMFS History: Patient Active Problem List   Diagnosis Date Noted  . Hypocalcemia 08/22/2016  . Gait abnormality 05/29/2016  . Encephalopathy acute 04/23/2016  . Hyponatremia 04/23/2016  . Schizophrenia, paranoid ([place])   . Aspiration pneumonia of left lower lobe due to gastric secretions ([place])   . Projectile vomiting with nausea   . Altered mental status   . Schizophrenia ([place])   . Disorganized schizophrenia ([place])   . Chronic pain syndrome   . Acute renal failure ([place])   . Controlled diabetes mellitus type 2 with complications ([place])   . Acute encephalopathy   . Oropharyngeal dysphagia   . Aspiration pneumonia of right lower lobe due to vomit ([place])   . Acute renal failure (ARF) ([place]) 03/28/2016  . Encephalopathy 03/27/2016  . HCAP (healthcare-associated pneumonia) 02/29/2016  . Pneumonia 02/14/2016  . CAP (community acquired pneumonia) 02/13/2016  . Edema 02/13/2016  . AKI (acute kidney injury) ([place]) 02/13/2016  . Elevated troponin 06/24/2015  . Diarrhea 06/23/2015  . Sepsis ([place]) 06/23/2015  . Obesity, morbid, BMI 50 or higher ([place]) 03/26/2015  . Post traumatic stress disorder (PTSD) 11/14/2014  . Leukocytosis 11/12/2014  . Slurred speech 11/12/2014  . CKD (chronic kidney disease) stage 3, GFR 30-59 ml/min   . Diabetes mellitus, type II, insulin dependent ([place])   . TIA (transient ischemic attack)   . Hypotension 10/28/2014  . Syncope 10/28/2014  . Anemia 10/28/2014  . Type II diabetes mellitus ([place]) 10/28/2014  . OSA on CPAP 10/28/2014  . Chronic lower back pain 10/28/2014   . Polyneuropathy in diabetes(357.2) 10/28/2014  . Diabetic polyneuropathy ([place])   . Fall 08/14/2014  . Recurrent falls 08/14/2014  . Hot flashes 02/24/2014  . Arthralgia 02/24/2014  . Hair loss 02/24/2014  . Chest pain 01/06/2014  . Shock ([place]) 08/08/2013  . 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 Dysarthria 12/30/2012  . Benign paroxysmal positional vertigo 12/30/2012  . Breast cancer of upper-outer quadrant of right female breast ([place]) 11/18/2012  . Hemorrhage of rectum and anus 09/28/2012  . Hx of radiation therapy   . Syncope and collapse 06/04/2011  . HSV 10/05/2008  . Asthma 06/02/2008  . HLD (hyperlipidemia) 06/02/2006  . Sinus tachycardia 05/25/2006  . Hypothyroidism 02/27/2006  . Obesity 02/27/2006  . Depression 02/27/2006  . Essential hypertension 02/27/2006  . GERD 02/27/2006  . KNEE PAIN 02/27/2006   Past Medical History:  Diagnosis Date  . Anemia   . Anxiety   . Arthritis    "pretty much all my joints"  .  Asthma   . Benign paroxysmal positional vertigo 12/30/2012  . Breast cancer ([place]) 02/13/12   ruq  100'clock bx Ductal Carcinoma in Situ,(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  . Breast mass in female    L breast 2008, US showed likely fat necrosis  . Chronic lower back pain   . CKD (chronic kidney disease), stage III    "lower stage" (01/06/2014)  . Daily headache   . Depression   . Gait abnormality 05/29/2016  . GERD (gastroesophageal reflux disease)   . History of blood transfusion 2011   "after one of my OR's"  . History of hiatal hernia   . History of stomach ulcers   . HSV (herpes simplex virus) infection   . Hx of radiation therapy 05/05/12- 07/15/12   right breast, 45 gray x 25 fx, lumpectomy cavity boosted to 16.2 gray  . Hyperlipemia   . Hypertension    sees Dr. Criss Rosales , [facility] [place]  . Hypothyroidism   . Kidney stones   . Knee pain, bilateral   . Migraines    "~ 3 times/month" (01/06/2014)  . Obesity   . OSA on CPAP    pt does not know settings    . Pancreatitis    hx of  . Personal history of radiation therapy 02/2012  . Pneumonia 1950's  . Polyneuropathy in diabetes(357.2)   . Schizophrenia ([place])   . 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 Tachycardia    with sx in 2008  . Thyroid disease   . Type II diabetes mellitus ([facility])     Family History  Problem Relation Age of Onset  . Heart disease Brother        Multiple MIs, starting in his 4s  . Diabetes Brother   . Thyroid disease Brother   . Hypertension Mother   . Diabetes Mother   . Breast cancer Mother 82  . Bone cancer Mother   . Hypertension Sister   . Diabetes Sister   . Breast cancer Sister 44  . Thyroid disease Sister   . Breast cancer Maternal Grandmother   . Heart disease Maternal Grandmother   . Uterine cancer Other 19  . Breast cancer Paternal Aunt 38  . Breast cancer Paternal Grandmother        dx in her 56s  . Prostate cancer Paternal Grandfather     Past Surgical History:  Procedure Laterality Date  . ABDOMINAL HYSTERECTOMY  1979?   partial  . BREAST LUMPECTOMY  03/03/2012   Procedure: LUMPECTOMY;  Surgeon: Haywood Lasso, MD;  Location: [facility];  Service: General;  Laterality: Right;  . BREAST SURGERY Right 01/2012   "cancer"  . CHOLECYSTECTOMY  1980's?  . COLONOSCOPY WITH PROPOFOL N/A 09/28/2012   Procedure: COLONOSCOPY WITH PROPOFOL;  Surgeon: Lear Ng, MD;  [facility];  Service: Endoscopy;  Laterality: N/A;  . FOOT FRACTURE SURGERY Right 1990's  . JOINT REPLACEMENT    . REVISION TOTAL KNEE ARTHROPLASTY  07/2009  . SHOULDER OPEN ROTATOR CUFF REPAIR Left 1990's  . THYROIDECTOMY  1970's  . TOTAL KNEE ARTHROPLASTY Bilateral 2009-06/2009   left; right   Social History   Occupational History  . Disabled    Social History Main Topics  . Smoking status: Former Smoker    Packs/day: 1.00    Years: 7.00    Types: Cigarettes    Quit date: 03/24/1992  . Smokeless tobacco: Never Used  . 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 Drug use: No  . Sexual activity: No

## 2016-10-10 ENCOUNTER — Ambulatory Visit (INDEPENDENT_AMBULATORY_CARE_PROVIDER_SITE_OTHER): Payer: Medicare Other | Admitting: Orthopaedic Surgery

## 2016-10-10 ENCOUNTER — Encounter (INDEPENDENT_AMBULATORY_CARE_PROVIDER_SITE_OTHER): Payer: Self-pay | Admitting: Orthopaedic Surgery

## 2016-10-10 VITALS — BP 178/101 | HR 108 | Ht 65.0 in | Wt 320.0 lb

## 2016-10-10 DIAGNOSIS — M5116 Intervertebral disc disorders with radiculopathy, lumbar region: Secondary | ICD-10-CM

## 2016-10-15 ENCOUNTER — Encounter (INDEPENDENT_AMBULATORY_CARE_PROVIDER_SITE_OTHER): Payer: Self-pay | Admitting: Orthopaedic Surgery

## 2016-10-15 ENCOUNTER — Ambulatory Visit (INDEPENDENT_AMBULATORY_CARE_PROVIDER_SITE_OTHER): Payer: Medicare Other | Admitting: Orthopaedic Surgery

## 2016-10-15 VITALS — Ht 65.0 in | Wt 320.0 lb

## 2016-10-15 DIAGNOSIS — M5116 Intervertebral disc disorders with radiculopathy, lumbar region: Secondary | ICD-10-CM

## 2016-10-15 NOTE — Progress Notes (Addendum)
Office Visit Note   Patient: [name]           Date of Birth: [birth year]           MRN: [id number] Visit Date: 10/15/2016              Requested by: Levin Bacon, NP [place], [place] [address] PCP: Levin Bacon, NP   Assessment & Plan: Visit Diagnoses:  1. Lumbar disc herniation with radiculopathy     Plan: With patient's weight multiple medical problems she is not a candidate for multilevel fusion procedure. Her principal problem is back and left leg pain which is significantly increased. Patient states she can't live the way she is currently in pain medication is not effective. She has a large disc herniation on the left L5-S1 with thecal compression and left S1 nerve root compression. Plan would be single level microdiscectomy on the left at L5-S1. She has multiple medical problems including secondary parkinsonism, morbid obesity, diabetes, stage III kidney disease, schizophrenia. She would need medical clearance before the procedure. We discussed surgical procedure morbidly obese patients. Risk of recurrent disc herniation discussed and I discussed with her that she really is not a good candidate for a larger procedure in my opinion due to the multiple medical problems and surgical risks. She should get some improvement in her back and leg pain with the microdiscectomy at the L5-S1 level where she has a large disc herniation and significant compression. She could be scheduled for this after medical approval. We would not change her pain medication regimen after the surgery and she would stay on the same medication that she's been receiving informed [facility]. We discussed increased risk due to her morbid obesity with risks of DVT, MI, recurrent disc herniation. Questions were elicited and answered. We discussed the importance of compliance postoperatively with medications and instructions.  Follow-Up Instructions: No Follow-up on file.   Orders:    No orders of the defined types were placed in this encounter.  No orders of the defined types were placed in this encounter.     Procedures: No procedures performed   Clinical Data: No additional findings.   Subjective: Chief Complaint  Patient presents with  . Lower Back - Pain    HPI patient returns with her disc from the line lumbar spine MRI for evaluation and review. She continues to have severe back pain and left greater than right leg pain. She's had long-standing moderate stenosis at L3-4 L4-5 without pars defects and 1 mm anterolisthesis at L4-5. She's had progression of disc herniation at L5-S1 with thecal sac compression and left S1 nerve root compression. She is on chronic pain management from [facility] and is on oxycodone 10 mg by mouth every 6 hours. This used to be 5 mg and has been increased. She has multiple complex medical problems including morbid obesity, depression, schizophrenia, asthma, GERD, anxiety, hypertension, stage III kidney disease, type 2 diabetes on insulin with last A1c 6.9. Patient rates her pain is severe she is barely able to get from sitting to standing and lives with a nephew does driving for advanced wound care.  Review of Systems 14 point review of systems updated and is unchanged from 10/10/2016 office visit.  Objective: Vital Signs: Ht _0  (1.651 m)   Wt (!) 320 lb (145.2 kg)   BMI 53.25 kg/m   Physical Exam  Constitutional: She is oriented to person, place, and time. She appears  well-developed.  HENT:  Head: Normocephalic.  Right Ear: External ear normal.  Left Ear: External ear normal.  Eyes: Pupils are equal, round, and reactive to light.  Neck: No tracheal deviation present. No thyromegaly present.  Cardiovascular: Normal rate.   Pulmonary/Chest: Effort normal.  Abdominal: Soft.  Morbidly obese  Musculoskeletal:  Patient sciatic notch tenderness. On the left palm on the right. Mild trochanteric bursal  tenderness no pain with hip range of motion internal and external rotation. Well-healed total knee arthroplasties. Healed rotator cuff scars from previous repair. There is a positive straight leg raising on the left at 70. Pain with straight leg raising on the right at 80. Positive popliteal compression test. She has peroneal weakness on the left as well as some gastrocsoleus weakness. She is not able to heel or toe walk. Decreased sensation left lateral foot plantar surface of her foot. An anterior tib on the right side shows trace weakness. She ambulates with a rolling walker with the hips in a flexed position and slight knee flexion. She cannot get up from a chair without use of her arms.  Neurological: She is alert and oriented to person, place, and time.  Skin: Skin is warm and dry.  Psychiatric: She has a normal mood and affect. Her behavior is normal.    Ortho Exam  Specialty Comments:  No specialty comments available.  Imaging: MRI scan from 2017, January 2018 and the most recent 09/26/2016 MRI from [facility] are reviewed. This shows enlargement L5-S1 disc herniation which is large with severe left S1 nerve root compression. She has moderate multifactorial stenosis at L3-4 and L4-5. No lytic or blastic lesions that could be related to her history of breast cancer.   PMFS History: Patient Active Problem List   Diagnosis Date Noted  . 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 Hypocalcemia 08/22/2016  . Gait abnormality 05/29/2016  . Encephalopathy acute 04/23/2016  . Hyponatremia 04/23/2016  . Schizophrenia, paranoid ([place])   . Aspiration pneumonia of left lower lobe due to gastric secretions ([place])   . Projectile vomiting with nausea   . Altered mental status   . Schizophrenia ([place])   . Disorganized schizophrenia ([place])   . Chronic pain syndrome   . Acute renal failure ([place])   . Controlled diabetes mellitus type 2 with complications ([place])   . Acute encephalopathy   .  Oropharyngeal dysphagia   . Aspiration pneumonia of right lower lobe due to vomit ([place])   . Acute renal failure (ARF) ([place]) 03/28/2016  . Encephalopathy 03/27/2016  . HCAP (healthcare-associated pneumonia) 02/29/2016  . Pneumonia 02/14/2016  . CAP (community acquired pneumonia) 02/13/2016  . Edema 02/13/2016  . AKI (acute kidney injury) ([place]) 02/13/2016  . Elevated troponin 06/24/2015  . Diarrhea 06/23/2015  . Sepsis ([place]) 06/23/2015  . Obesity, morbid, BMI 50 or higher ([place]) 03/26/2015  . Post traumatic stress disorder (PTSD) 11/14/2014  . Leukocytosis 11/12/2014  . Slurred speech 11/12/2014  . CKD (chronic kidney disease) stage 3, GFR 30-59 ml/min   . Diabetes mellitus, type II, insulin dependent ([place])   . TIA (transient ischemic attack)   . Hypotension 10/28/2014  . Syncope 10/28/2014  . Anemia 10/28/2014  . Type II diabetes mellitus ([place]) 10/28/2014  . OSA on CPAP 10/28/2014  . Chronic lower back pain 10/28/2014  . Polyneuropathy in diabetes(357.2) 10/28/2014  . Diabetic polyneuropathy ([place])   . Fall 08/14/2014  . Recurrent falls 08/14/2014  . Hot flashes 02/24/2014  . Arthralgia 02/24/2014  .  Hair loss 02/24/2014  . Chest pain 01/06/2014  . Shock ([place]) 08/08/2013  . 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 Dysarthria 12/30/2012  . Benign paroxysmal positional vertigo 12/30/2012  . Breast cancer of upper-outer quadrant of right female breast ([place]) 11/18/2012  . Hemorrhage of rectum and anus 09/28/2012  . Hx of radiation therapy   . Syncope and collapse 06/04/2011  . HSV 10/05/2008  . Asthma 06/02/2008  . HLD (hyperlipidemia) 06/02/2006  . Sinus tachycardia 05/25/2006  . Hypothyroidism 02/27/2006  . Obesity 02/27/2006  . Depression 02/27/2006  . Essential hypertension 02/27/2006  . GERD 02/27/2006  . KNEE PAIN 02/27/2006   Past Medical History:  Diagnosis Date  . Anemia   . Anxiety   . Arthritis    "pretty much all my joints"  . Asthma   . Benign  paroxysmal positional vertigo 12/30/2012  . Breast cancer ([place]) 02/13/12   ruq  100'clock bx Ductal Carcinoma in Situ,(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  . Breast mass in female    L breast 2008, US showed likely fat necrosis  . Chronic lower back pain   . CKD (chronic kidney disease), stage III    "lower stage" (01/06/2014)  . Daily headache   . Depression   . Gait abnormality 05/29/2016  . GERD (gastroesophageal reflux disease)   . History of blood transfusion 2011   "after one of my OR's"  . History of hiatal hernia   . History of stomach ulcers   . HSV (herpes simplex virus) infection   . Hx of radiation therapy 05/05/12- 07/15/12   right breast, 45 gray x 25 fx, lumpectomy cavity boosted to 16.2 gray  . Hyperlipemia   . Hypertension    sees Dr. Criss Rosales , [facility] [place]  . Hypothyroidism   . Kidney stones   . Knee pain, bilateral   . Migraines    "~ 3 times/month" (01/06/2014)  . Obesity   . OSA on CPAP    pt does not know settings  . Pancreatitis    hx of  . Personal history of radiation therapy 02/2012  . Pneumonia 1950's  . Polyneuropathy in diabetes(357.2)   . Schizophrenia ([place])   . 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 Tachycardia    with sx in 2008  . Thyroid disease   . Type II diabetes mellitus ([facility])     Family History  Problem Relation Age of Onset  . Heart disease Brother        Multiple MIs, starting in his 21s  . Diabetes Brother   . Thyroid disease Brother   . Hypertension Mother   . Diabetes Mother   . Breast cancer Mother 29  . Bone cancer Mother   . Hypertension Sister   . Diabetes Sister   . Breast cancer Sister 29  . Thyroid disease Sister   . Breast cancer Maternal Grandmother   . Heart disease Maternal Grandmother   . Uterine cancer Other 19  . Breast cancer Paternal Aunt 1  . Breast cancer Paternal Grandmother        dx in her 4s  . Prostate cancer Paternal Grandfather     Past Surgical History:  Procedure Laterality Date  . ABDOMINAL  HYSTERECTOMY  1979?   partial  . BREAST LUMPECTOMY  03/03/2012   Procedure: LUMPECTOMY;  Surgeon: Haywood Lasso, MD;  Location: [facility];  Service: General;  Laterality: Right;  . BREAST SURGERY Right 01/2012   "cancer"  . CHOLECYSTECTOMY  1980's?  . COLONOSCOPY  WITH PROPOFOL N/A 09/28/2012   Procedure: COLONOSCOPY WITH PROPOFOL;  Surgeon: Lear Ng, MD;  [facility];  Service: Endoscopy;  Laterality: N/A;  . FOOT FRACTURE SURGERY Right 1990's  . JOINT REPLACEMENT    . REVISION TOTAL KNEE ARTHROPLASTY  07/2009  . SHOULDER OPEN ROTATOR CUFF REPAIR Left 1990's  . THYROIDECTOMY  1970's  . TOTAL KNEE ARTHROPLASTY Bilateral 2009-06/2009   left; right   Social History   Occupational History  . Disabled    Social History Main Topics  . Smoking status: Former Smoker    Packs/day: 1.00    Years: 7.00    Types: Cigarettes    Quit date: 03/24/1992  . Smokeless tobacco: Never Used  . 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 Drug use: No  . Sexual activity: No

## 2016-10-15 NOTE — Progress Notes (Signed)
Office Visit Note   Patient: [name]           Date of Birth: 1955/05/07           MRN: [id number] Visit Date: 10/10/2016              Requested by: Levin Bacon, NP [place], [place] [address] PCP: Levin Bacon, NP   Assessment & Plan: Visit Diagnoses: L5-S1 left HNP with radiculopathy  Plan: Patient will return with the imaging studies for review in a few days. She's been on chronic narcotic pain management for many years. Multiple medical problems but she does have a large disc herniation at L5-S1 with significant pain in great difficulty ambulating. Follow-up in a few days for review of her scan from [facility].  Follow-Up Instructions: Return in about 1 week (around 10/17/2016).   Orders:  No orders of the defined types were placed in this encounter.  No orders of the defined types were placed in this encounter.     Procedures: No procedures performed   Clinical Data: No additional findings.   Subjective: Chief Complaint  Patient presents with  . Lower Back - Pain    HPI 61 year old female using a rolling walker with severe back and left leg pain. She's had numerous epidural injections has long-term disc degeneration with some moderate narrowing at L3-4 and L4-5 and has had significant increased pain in the last 6 months with moderately large left central disc extrusion with compression of thecal sac and left S1 nerve root. She has significant medical problems including morbid obesity, depression, secondary parkinsonism, right breast cancer, GERD, hypertension, depression, schizophrenia. She had seen Dr. Sharol Given who has referred her to me for surgical evaluation for the large disc herniation and severe pain. Patient is followed at [facility]. She has a sleep apnea machine and compliance has been 64%. She's been on a rolling walker for a year prior to that was on a standard 4 leg walker for several years.  Review of Systems 14 point  review of systems is obtained. Patient has anxiety depression, History of kidney stones,  Schizophrenia, secondary parkinsonism, hypertension for which she sees Dr. Criss Rosales, history of stomach ulcers, previous right foot surgery, rotator cuff repair left, right and left total knee arthroplasties 2009 2011. Right breast lumpectomy 2014 for cancer. Moderate spinal stenosis L3-4 and L4-5. Chronic narcotic pain management. Otherwise negative as it pertains history of present illness  Objective: Vital Signs: BP (!) 178/101   Pulse (!) 108   Ht _0  (1.651 m)   Wt (!) 320 lb (145.2 kg)   BMI 53.25 kg/m   Physical Exam  Constitutional: She is oriented to person, place, and time. She appears well-developed.  HENT:  Head: Normocephalic.  Right Ear: External ear normal.  Left Ear: External ear normal.  Eyes: Pupils are equal, round, and reactive to light.  Neck: No tracheal deviation present. No thyromegaly present.  Cardiovascular: Normal rate.   Pulmonary/Chest: Effort normal.  Abdominal: Soft.  Neurological: She is alert and oriented to person, place, and time.  Skin: Skin is warm and dry.  Psychiatric: She has a normal mood and affect. Her behavior is normal.    Ortho Exam patient has difficulty getting from a sitting to standing position. She leans over walker and flexed position. Positive straight leg raising right at 70 left at 90. Negative contralateral straight leg raise. No cellulitis of lower extremities. She has some decreased dorsiflexion right  wrist compared to left wrist.elbows root reach full extension healed arthroscopic portals from previous shoulder surgery. Positive first [place] grind test on the right moderate on the left.  Specialty Comments:  No specialty comments available.  Imaging: Lumbar MRI scan 09/26/2016 shows moderately severe spinal stenosis at L3-4 and L4-5. Large disc extrusion left central with compression of thecal sac and severe compression left S1 nerve root.  She does have some facet joint arthritis. No lytic or blastic lesions are seen in the lumbar spine. This is compared to 03/01/2016 MRI scan which showed some disc herniation L5-S1 which has progressed.   PMFS History: Patient Active Problem List   Diagnosis Date Noted  . 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 Hypocalcemia 08/22/2016  . Gait abnormality 05/29/2016  . Encephalopathy acute 04/23/2016  . Hyponatremia 04/23/2016  . Schizophrenia, paranoid ([place])   . Aspiration pneumonia of left lower lobe due to gastric secretions ([place])   . Projectile vomiting with nausea   . Altered mental status   . Schizophrenia ([place])   . Disorganized schizophrenia ([place])   . Chronic pain syndrome   . Acute renal failure ([place])   . Controlled diabetes mellitus type 2 with complications ([place])   . Acute encephalopathy   . Oropharyngeal dysphagia   . Aspiration pneumonia of right lower lobe due to vomit ([place])   . Acute renal failure (ARF) ([place]) 03/28/2016  . Encephalopathy 03/27/2016  . HCAP (healthcare-associated pneumonia) 02/29/2016  . Pneumonia 02/14/2016  . CAP (community acquired pneumonia) 02/13/2016  . Edema 02/13/2016  . AKI (acute kidney injury) ([place]) 02/13/2016  . Elevated troponin 06/24/2015  . Diarrhea 06/23/2015  . Sepsis ([place]) 06/23/2015  . Obesity, morbid, BMI 50 or higher ([place]) 03/26/2015  . Post traumatic stress disorder (PTSD) 11/14/2014  . Leukocytosis 11/12/2014  . Slurred speech 11/12/2014  . CKD (chronic kidney disease) stage 3, GFR 30-59 ml/min   . Diabetes mellitus, type II, insulin dependent ([place])   . TIA (transient ischemic attack)   . Hypotension 10/28/2014  . Syncope 10/28/2014  . Anemia 10/28/2014  . Type II diabetes mellitus ([place]) 10/28/2014  . OSA on CPAP 10/28/2014  . Chronic lower back pain 10/28/2014  . Polyneuropathy in diabetes(357.2) 10/28/2014  . Diabetic polyneuropathy ([place])   . Fall 08/14/2014  . Recurrent falls 08/14/2014  . Hot  flashes 02/24/2014  . Arthralgia 02/24/2014  . Hair loss 02/24/2014  . Chest pain 01/06/2014  . Shock ([place]) 08/08/2013  . Secondary parkinsonism ([place]) 12/30/2012  . Dysarthria 12/30/2012  . Benign paroxysmal positional vertigo 12/30/2012  . Breast cancer of upper-outer quadrant of right female breast ([place]) 11/18/2012  . Hemorrhage of rectum and anus 09/28/2012  . Hx of radiation therapy   . Syncope and collapse 06/04/2011  . HSV 10/05/2008  . Asthma 06/02/2008  . HLD (hyperlipidemia) 06/02/2006  . Sinus tachycardia 05/25/2006  . Hypothyroidism 02/27/2006  . Obesity 02/27/2006  . Depression 02/27/2006  . Essential hypertension 02/27/2006  . GERD 02/27/2006  . KNEE PAIN 02/27/2006   Past Medical History:  Diagnosis Date  . Anemia   . Anxiety   . Arthritis    "pretty much all my joints"  . Asthma   . Benign paroxysmal positional vertigo 12/30/2012  . Breast cancer ([place]) 02/13/12   ruq  100'clock bx Ductal Carcinoma in Situ,(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  . Breast mass in female    L breast 2008, US showed likely fat necrosis  . Chronic lower back pain   .  CKD (chronic kidney disease), stage III    "lower stage" (01/06/2014)  . Daily headache   . Depression   . Gait abnormality 05/29/2016  . GERD (gastroesophageal reflux disease)   . History of blood transfusion 2011   "after one of my OR's"  . History of hiatal hernia   . History of stomach ulcers   . HSV (herpes simplex virus) infection   . Hx of radiation therapy 05/05/12- 07/15/12   right breast, 45 gray x 25 fx, lumpectomy cavity boosted to 16.2 gray  . Hyperlipemia   . Hypertension    sees Dr. Criss Rosales , [facility]  . Hypothyroidism   . Kidney stones   . Knee pain, bilateral   . Migraines    "~ 3 times/month" (01/06/2014)  . Obesity   . OSA on CPAP    pt does not know settings  . Pancreatitis    hx of  . Personal history of radiation therapy 02/2012  . Pneumonia 1950's  . Polyneuropathy in diabetes(357.2)   .  Schizophrenia ([place])   . Secondary parkinsonism ([place]) 12/30/2012  . Tachycardia    with sx in 2008  . Thyroid disease   . Type II diabetes mellitus ([facility])     Family History  Problem Relation Age of Onset  . Heart disease Brother        Multiple MIs, starting in his 24s  . Diabetes Brother   . Thyroid disease Brother   . Hypertension Mother   . Diabetes Mother   . Breast cancer Mother 71  . Bone cancer Mother   . Hypertension Sister   . Diabetes Sister   . Breast cancer Sister 48  . Thyroid disease Sister   . Breast cancer Maternal Grandmother   . Heart disease Maternal Grandmother   . Uterine cancer Other 19  . Breast cancer Paternal Aunt 51  . Breast cancer Paternal Grandmother        dx in her 38s  . Prostate cancer Paternal Grandfather     Past Surgical History:  Procedure Laterality Date  . ABDOMINAL HYSTERECTOMY  1979?   partial  . BREAST LUMPECTOMY  03/03/2012   Procedure: LUMPECTOMY;  Surgeon: Haywood Lasso, MD;  Location: [facility];  Service: General;  Laterality: Right;  . BREAST SURGERY Right 01/2012   "cancer"  . CHOLECYSTECTOMY  1980's?  . COLONOSCOPY WITH PROPOFOL N/A 09/28/2012   Procedure: COLONOSCOPY WITH PROPOFOL;  Surgeon: Lear Ng, MD;  Location: [facility];  Service: Endoscopy;  Laterality: N/A;  . FOOT FRACTURE SURGERY Right 1990's  . JOINT REPLACEMENT    . REVISION TOTAL KNEE ARTHROPLASTY  07/2009  . SHOULDER OPEN ROTATOR CUFF REPAIR Left 1990's  . THYROIDECTOMY  1970's  . TOTAL KNEE ARTHROPLASTY Bilateral 2009-06/2009   left; right   Social History   Occupational History  . Disabled    Social History Main Topics  . Smoking status: Former Smoker    Packs/day: 1.00    Years: 7.00    Types: Cigarettes    Quit date: 03/24/1992  . Smokeless tobacco: Never Used  . 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 Drug use: No  . Sexual activity: No

## 2016-10-15 NOTE — Addendum Note (Signed)
Addended by: Marybelle Killings on: 10/15/2016 01:04 PM   Modules accepted: Level of Service

## 2016-10-28 ENCOUNTER — Ambulatory Visit (INDEPENDENT_AMBULATORY_CARE_PROVIDER_SITE_OTHER)
Admission: RE | Admit: 2016-10-28 | Discharge: 2016-10-28 | Disposition: A | Discharge status: Home / Self Care | Payer: Medicare Other | Source: Ambulatory Visit | Attending: Internal Medicine | Admitting: Internal Medicine

## 2016-10-28 ENCOUNTER — Ambulatory Visit (INDEPENDENT_AMBULATORY_CARE_PROVIDER_SITE_OTHER): Payer: Medicare Other | Admitting: Internal Medicine

## 2016-10-28 ENCOUNTER — Telehealth (INDEPENDENT_AMBULATORY_CARE_PROVIDER_SITE_OTHER): Payer: Self-pay | Admitting: Orthopedic Surgery

## 2016-10-28 ENCOUNTER — Encounter: Payer: Self-pay | Admitting: Internal Medicine

## 2016-10-28 VITALS — BP 126/74 | HR 114 | Ht 65.0 in | Wt 335.0 lb

## 2016-10-28 DIAGNOSIS — J4530 Mild persistent asthma, uncomplicated: Secondary | ICD-10-CM

## 2016-10-28 LAB — NITRIC OXIDE: Nitric Oxide: 63

## 2016-10-28 MED ORDER — BUDESONIDE-FORMOTEROL FUMARATE 80-4.5 MCG/ACT IN AERO: 2.0000 | INHALATION_SPRAY | Freq: Two times a day (BID) | RESPIRATORY_TRACT | 0 refills | Status: DC

## 2016-10-28 MED ORDER — BUDESONIDE-FORMOTEROL FUMARATE 80-4.5 MCG/ACT IN AERO: 2.0000 | INHALATION_SPRAY | Freq: Two times a day (BID) | RESPIRATORY_TRACT | 11 refills | Status: DC

## 2016-10-28 NOTE — Telephone Encounter (Signed)
Patient called asked if she can get a Rx for the cream Dr Sharol Given prescribed for her. Patient did not remember the name of the cream. The number to contact patient is [phone]

## 2016-10-28 NOTE — Progress Notes (Signed)
Subjective:     Patient ID: [name], female   DOB: 1956/01/10,    MRN: [id number]  HPI   75 yobf MO/ asthma as child poor ex tolerance attributed to asthma better by HS and worse again a few years but did require inhalers starting around 66 and quit smoking  @ 89 about then but breathing worse with colds or worse in heat and humidity and trend has been better but using saba freq esp in summer and then colds in winter and referred to pulmonary clinic 10/28/2016 by Osvaldo Angst  to clear for surgery on back with 3 bad discs radiating to L > R leg    10/28/2016 1st [facility] office visit/ Harjas Biggins   Chief Complaint  Patient presents with  . Pulmonary Consult    Referred by Osvaldo Angst, NP. Pt states needing pulmonary clearance for spine surgery. She states she was dxed with Asthma as a child. She feels like her Asthma is under good control at this time. She occ will get congested if she is out in the heat for too long.  She has been using her proair inhaler 3-4 x per day "just in the summer".   no inhalers x 6 d prior to OV   Sleeps propped up due to vertigo so stays about 30 degrees all night s resp symptoms  Has gerd takes ppi ac bid  Seasonal rhinitis worse in spring   No obvious day to day or daytime variability or assoc excess/ purulent sputum or mucus plugs or hemoptysis or cp or chest tightness, subjective wheeze or overt sinus or hb symptoms. No unusual exp hx or h/o childhood pna/ asthma or knowledge of premature birth.  Sleeping ok 30 degrees  without nocturnal  or early am exacerbation  of respiratory  c/o's or need for noct saba. Also denies any obvious fluctuation of symptoms with weather or environmental changes or other aggravating or alleviating factors except as outlined above   Current Allergies, Complete Past Medical History, Past Surgical History, Family History, and Social History were reviewed in [employer] record.  ROS  The following are not  active complaints unless bolded sore throat, dysphagia, dental problems, itching, sneezing,  nasal congestion or disharge of excess mucus or purulent secretions, ear ache,   fever, chills, sweats, unintended wt loss or wt gain, classically pleuritic or exertional cp,  orthopnea pnd or leg swelling, presyncope, palpitations, abdominal pain, anorexia, nausea, vomiting, diarrhea  or change in bowel habits or bladder habits, change in stools or change in urine, dysuria, hematuria,  rash, arthralgias, visual complaints, headache, numbness, weakness or ataxia or problems with walking or coordination,  change in mood/affect or memory.        Current Meds  Medication Sig  . acetaminophen (TYLENOL) 650 MG CR tablet Take 650 mg by mouth every 4 (four) hours. Per SO  . albuterol (PROAIR HFA) 108 (90 Base) MCG/ACT inhaler Inhale 2 puffs into the lungs every 4 (four) hours as needed for wheezing or shortness of breath.   [name] aspirin EC 81 MG tablet Take 1 tablet (81 mg total) by mouth daily.  . B Complex-C (B-COMPLEX WITH VITAMIN C) tablet Take 1 tablet by mouth daily.  . calcitRIOL (ROCALTROL) 0.25 MCG capsule Take 1 capsule (0.25 mcg total) by mouth 3 (three) times a week.  . Cholecalciferol (VITAMIN D-3) 1000 units CAPS Take 1,000 Units by mouth daily.  [name] esomeprazole (NEXIUM) 40 MG capsule Take 1 capsule (40  mg total) by mouth daily before breakfast.  . furosemide (LASIX) 40 MG tablet Take 1 tablet (40 mg total) by mouth daily.  . insulin aspart (NOVOLOG) 100 UNIT/ML injection Inject 0-9 Units into the skin 3 (three) times daily with meals. 0-9 Units, Subcutaneous CBG < 70: implement hypoglycemia protocol CBG 70 - 120: 0 units CBG 121 - 150: 1 unit CBG 151 - 200: 2 units CBG 201 - 250: 3 units CBG 251 - 300: 5 units CBG 301 - 350: 7 units CBG 351 - 400: 9 units CBG > 400: call MD  . Insulin Detemir (LEVEMIR FLEXTOUCH) 100 UNIT/ML Pen Inject 10 Units into the skin at bedtime.   [name] JANUMET 50-500 MG  tablet Take 1 tablet by mouth daily.  [name] levothyroxine (SYNTHROID, LEVOTHROID) 150 MCG tablet Take 1 tablet (150 mcg total) by mouth daily before breakfast.  . linaclotide (LINZESS) 290 MCG CAPS capsule Take 290 mcg by mouth daily before breakfast.  . LORazepam (ATIVAN) 1 MG tablet Take 0.25 mg by mouth 2 (two) times daily. 66m in morning and 2 mg in the evening   . LOSARTAN POTASSIUM-HCTZ PO ? strength  . LYRICA 225 MG capsule Take 225 mg by mouth daily.  . Multiple Vitamin (MULTIVITAMIN WITH MINERALS) TABS tablet Take 1 tablet by mouth daily.  . rosuvastatin (CRESTOR) 20 MG tablet Take 20 mg by mouth daily.  .Marland KitchentiZANidine (ZANAFLEX) 2 MG tablet Take 2 mg by mouth.  .Marland KitchenUNABLE TO FIND Med Name: CPAP  . venlafaxine XR (EFFEXOR-XR) 150 MG 24 hr capsule Take 150 mg by mouth 2 (two) times daily.  . ziprasidone (GEODON) 20 MG capsule Take 1 capsule (20 mg total) by mouth 2 (two) times daily with a meal.       Review of Systems     Objective:   Physical Exam  Obese amb bf very uncomfortable with back/leg pain at rest sitting and very hoarse  Wt Readings from Last 3 Encounters:  10/28/16 (!) 335 lb (152 kg)  10/15/16 (!) 320 lb (145.2 kg)  10/10/16 (!) 320 lb (145.2 kg)    Vital signs reviewed - Note on arrival 02 sats  92% on RA    HEENT: nl  turbinates bilaterally, and oropharynx. Nl external ear canals without cough reflex - poor dentition Modified Mallampati Score =   4    NECK :  without JVD/Nodes/TM/ nl carotid upstrokes bilaterally   LUNGS: no acc muscle use,  Nl contour chest which is clear to A and P bilaterally without cough on insp or exp maneuvers   CV:  RRR  no s3 or murmur or increase in P2, and trace sym lower ext edema   ABD:  soft and nontender with nl inspiratory excursion in the supine position. No bruits or organomegaly appreciated, bowel sounds nl  MS:   ext warm without deformities, calf tenderness, cyanosis or clubbing No obvious joint restrictions   SKIN:  warm and dry without lesions    NEURO:  alert, approp, nl sensorium with  no motor or cerebellar deficits apparent.       CXR PA and Lateral:   10/28/2016 :    I personally reviewed images and agree with radiology impression as follows:    Peribronchial thickening and interstitial prominence, likely bronchitic changes.    Assessment:

## 2016-10-28 NOTE — Patient Instructions (Addendum)
Plan A = Automatic = symbicort 80 Take 2 puffs first thing in am and then another 2 puffs about 12 hours later.    Work on inhaler technique:  relax and gently blow all the way out then take a nice smooth deep breath back in, triggering the inhaler at same time you start breathing in.  Hold for up to 5 seconds if you can. Blow out thru nose. Rinse and gargle with water when done     Plan B = Backup Only use your albuterol as a rescue medication to be used if you can't catch your breath by resting or doing a relaxed purse lip breathing pattern.  - The less you use it, the better it will work when you need it. - Ok to use the inhaler up to 2 puffs  every 4 hours if you must but call for appointment if use goes up over your usual need - Don't leave home without it !!  (think of it like the spare tire for your car)       Please remember to go to the  x-ray department downstairs in the basement  for your tests - we will call you with the results when they are available.      You are cleared for surgery on your back

## 2016-10-29 NOTE — Assessment & Plan Note (Signed)
This is the major obstacle to back surgery and note at baseline not lying flat due to vertigo but will need to be supine for surgery and may need bipap bridge post op and pulmonary f/u prn   Reviewed need for early mobilization/ IS / min narc post op   Discussed in detail all the  indications, usual  risks and alternatives  relative to the benefits with patient who agrees to proceed with surgery with the above stipulations and maint on symb 80 2bid in meantime  Total time devoted to counseling  > 50 % of initial 60 min office visit:  review case with pt/ discussion of options/alternatives/ personally creating written customized instructions  in presence of pt  then going over those specific  Instructions directly with the pt including how to use all of the meds but in particular covering each new medication in detail and the difference between the maintenance= "automatic" meds and the prns using an action plan format for the latter (If this problem/symptom => do that organization reading Left to right).  Please see AVS from this visit for a full list of these instructions which I personally wrote for this pt and  are unique to this visit.

## 2016-10-29 NOTE — Progress Notes (Signed)
Spoke with pt and notified of results per Dr. Wert. Pt verbalized understanding and denied any questions. 

## 2016-10-29 NOTE — Telephone Encounter (Signed)
I called and left voicemail advising maybe it was voltaren gel, this is the only topical medication I saw prescribed from Dr. Sharol Given. Advised her to call us back if this is it, and let us know which pharmacy.

## 2016-10-29 NOTE — Assessment & Plan Note (Addendum)
Spirometry 10/28/2016  FEV1 2.09 (51%)  Ratio 64 s rx x 6 days prior - FENO 10/28/2016  =   63 off all rx  - 10/28/2016  After extensive coaching HFA effectiveness =    75% from a basline of 25% > rx symb  80 2 bid  She actually meets criteria for "severe asthma" but probably only moderate and should not be an issue for back surgery so much as is her wt (see separate a/p)  - the feno though is elevated and suspect she has significant chronic persistent (not intermittent) asthma that will need more consistent ICS   Goals of therapy reviewed:  If your breathing worsens or you need to use your rescue inhaler more than twice weekly or wake up more than twice a month with any respiratory symptoms or require more than two rescue inhalers per year, we need to see you right away because this means we're not controlling the underlying problem (inflammation) adequately.  Rescue inhalers do not control inflammation and overuse can lead to unnecessary and costly consequences.  They can make you feel better temporarily but eventually they will quit working effectively much as sleep aids lead to more insomnia if used regularly.

## 2016-11-02 ENCOUNTER — Encounter: Payer: Self-pay | Admitting: Adult Health

## 2016-11-03 ENCOUNTER — Ambulatory Visit (INDEPENDENT_AMBULATORY_CARE_PROVIDER_SITE_OTHER): Payer: Medicare Other | Admitting: Adult Health

## 2016-11-03 ENCOUNTER — Telehealth (INDEPENDENT_AMBULATORY_CARE_PROVIDER_SITE_OTHER): Payer: Self-pay | Admitting: Orthopedic Surgery

## 2016-11-03 ENCOUNTER — Encounter: Payer: Self-pay | Admitting: Adult Health

## 2016-11-03 VITALS — BP 146/98 | HR 96 | Wt 342.8 lb

## 2016-11-03 DIAGNOSIS — G4733 Obstructive sleep apnea (adult) (pediatric): Secondary | ICD-10-CM

## 2016-11-03 DIAGNOSIS — Z9989 Dependence on other enabling machines and devices: Secondary | ICD-10-CM

## 2016-11-03 MED ORDER — DICLOFENAC SODIUM 1 % TD GEL: 2.0000 g | Freq: Four times a day (QID) | TRANSDERMAL | 3 refills | Status: AC | PRN

## 2016-11-03 NOTE — Patient Instructions (Signed)
Your Plan:  Continue using CPAP nightly  Compliance is excellent! If your symptoms worsen or you develop new symptoms please let us know.    Thank you for coming to see [place] at [facility]. I hope we have been able to provide you high quality care today.  You may receive a patient satisfaction survey over the next few weeks. We would appreciate your feedback and comments so that we may continue to improve ourselves and the health of our patients.

## 2016-11-03 NOTE — Progress Notes (Addendum)
PATIENT: [name] DOB: [birth year]  REASON FOR VISIT: follow up- OSA on CPAP HISTORY FROM: patient  HISTORY OF PRESENT ILLNESS: Today 11/03/16   [name] is a 61 year old female with a history of obstructive sleep apnea on CPAP. She returns today for a compliance download. Her download indicates that she uses her machine every night for compliance of 100%. She uses her machine greater than 4 hours 28 out of 30 days for compliance of 93%. On average she uses her machine 8 hours and 24 minutes. Her residual AHI is 4.8 on 12 cm water with EPR 2. The patient states that she does not feel her mask leaking. She reports that she did go to her DME company and a gave her a different size mask. She states that she cannot tolerate a full face or nasal mask. She finds the nasal pillow is the most comfortable and easy to use. She reports that she is following with pain management as well as orthopedic surgeon Dr. gait regarding ongoing back pain. She is considering surgery. She returns today for an evaluation.  HISTORY Copied from Dr. Guadelupe Sabin notes: [name] is a 61 year old right-handed woman with a complex medical history of breast cancer, chronic low back pain, mood disorder, reflux disease, anemia, arthritis, hyperlipidemia, hypertension, hypothyroidism, kidney stones, migraine headaches, history of pancreatitis, diabetes and morbid obesity with a BMI of over 88, who presents for follow-up consultation of her obstructive sleep apnea, after her recent split-night sleep study. The patient is unaccompanied today. I first met her on 06/30/2016 at the request of Dr. Jannifer Franklin, at which time she reported a prior diagnosis of obstructive sleep apnea and is using CPAP. She had an older machine. She required reevaluation as it had been possibly 20 years since her original sleep study. I advised her to proceed with sleep study testing. She had a split-night sleep study on 07/11/2016. I went over her test  results with her in detail today. Baseline sleep efficiency was 79.5%, REM sleep was absent. She had a highly increased percentage of stage II sleep, absence of slow-wave sleep and REM sleep. Total AHI was 46.1 per hour, average oxygen saturation of only 89%, nadir in non-REM sleep was 81%. She had no significant PLMS during the first part of the study. She was then fitted with nasal pillows. She was titrated on CPAP from 5 cm to 10 cm. On the final pressure her AHI was suboptimal at 10.9 per hour, supine sleep achieved, O2 nadir of 92%. She had no significant PLMS. Based on her test results I prescribed CPAP therapy for home use a pressure of 11 cm as she was not fully treated on 10 cm.   09/30/2016 I reviewed her CPAP compliance data from 07/25/2016 through 09/28/2016 which is a total of 66 days, during which time she used her CPAP 48 days with percent used days greater than 4 hours at 64%, indicating suboptimal compliance with an average usage of 6 hours and 44 minutes, residual AHI suboptimal at 13.4 per hour, leak on the high side with the 95th percentile at 35.4 L/m on a pressure of 11 cm with EPR of 2. In the past 30 days, she only used her machine 22 days, percent used days greater than 4 hours again suboptimal at 67%, average usage of 7 hours and 30 minutes, residual AHI 16.3 per hour, leak high with the 95th percentile at 42.8 L/m. She reports sleeping better with her CPAP. She could not use  it when her brother was sick recently and in the hospital, sadly, he passed away since then. Also lost her aunt after that. She feels better overall with respect to her sleep. She is using nasal pillows. May open mouth at night.    REVIEW OF SYSTEMS: Out of a complete 14 system review of symptoms, the patient complains only of the following symptoms, and all other reviewed systems are negative.  Fatigue, unexpected weight change, excessive sweating, eye discharge, eye itching, eye redness, double vision, eye  pain, blurred vision, cold intolerance, heat intolerance, excessive thirst, excessive eating, environmental allergies, frequent infections, swollen lymph nodes, bruise/bleed easily, anemia  ALLERGIES: Allergies  Allergen Reactions  . Bee Venom Anaphylaxis  . Shellfish Allergy Anaphylaxis  . Buprenorphine Hcl Other (See Comments)    Overly sedated    HOME MEDICATIONS: Outpatient Medications Prior to Visit  Medication Sig Dispense Refill  . acetaminophen (TYLENOL) 650 MG CR tablet Take 650 mg by mouth every 4 (four) hours. Per SO    . albuterol (PROAIR HFA) 108 (90 Base) MCG/ACT inhaler Inhale 2 puffs into the lungs every 4 (four) hours as needed for wheezing or shortness of breath.     [name] aspirin EC 81 MG tablet Take 1 tablet (81 mg total) by mouth daily. 30 tablet 0  . B Complex-C (B-COMPLEX WITH VITAMIN C) tablet Take 1 tablet by mouth daily.    . budesonide-formoterol (SYMBICORT) 80-4.5 MCG/ACT inhaler Inhale 2 puffs into the lungs 2 (two) times daily. 1 Inhaler 11  . calcitRIOL (ROCALTROL) 0.25 MCG capsule Take 1 capsule (0.25 mcg total) by mouth 3 (three) times a week. 13 capsule 5  . Cholecalciferol (VITAMIN D-3) 1000 units CAPS Take 1,000 Units by mouth daily.    . diclofenac sodium (VOLTAREN) 1 % GEL Apply 2 g topically 4 (four) times daily as needed. 1 Tube 3  . esomeprazole (NEXIUM) 40 MG capsule Take 1 capsule (40 mg total) by mouth daily before breakfast. 30 capsule 1  . furosemide (LASIX) 40 MG tablet Take 1 tablet (40 mg total) by mouth daily. 30 tablet 0  . insulin aspart (NOVOLOG) 100 UNIT/ML injection Inject 0-9 Units into the skin 3 (three) times daily with meals. 0-9 Units, Subcutaneous CBG < 70: implement hypoglycemia protocol CBG 70 - 120: 0 units CBG 121 - 150: 1 unit CBG 151 - 200: 2 units CBG 201 - 250: 3 units CBG 251 - 300: 5 units CBG 301 - 350: 7 units CBG 351 - 400: 9 units CBG > 400: call MD 10 mL 11  . Insulin Detemir (LEVEMIR FLEXTOUCH) 100 UNIT/ML Pen  Inject 10 Units into the skin at bedtime.     [name] JANUMET 50-500 MG tablet Take 1 tablet by mouth daily.    [name] levothyroxine (SYNTHROID, LEVOTHROID) 150 MCG tablet Take 1 tablet (150 mcg total) by mouth daily before breakfast. 30 tablet 2  . linaclotide (LINZESS) 290 MCG CAPS capsule Take 290 mcg by mouth daily before breakfast.    . LORazepam (ATIVAN) 1 MG tablet Take 0.25 mg by mouth 2 (two) times daily. 22m in morning and 2 mg in the evening     . LOSARTAN POTASSIUM-HCTZ PO ? strength    . LYRICA 225 MG capsule Take 225 mg by mouth daily.  1  . Multiple Vitamin (MULTIVITAMIN WITH MINERALS) TABS tablet Take 1 tablet by mouth daily.    . rosuvastatin (CRESTOR) 20 MG tablet Take 20 mg by mouth daily.    .[name]  tiZANidine (ZANAFLEX) 2 MG tablet Take 2 mg by mouth.    [name] UNABLE TO FIND Med Name: CPAP    . venlafaxine XR (EFFEXOR-XR) 150 MG 24 hr capsule Take 150 mg by mouth 2 (two) times daily.    . ziprasidone (GEODON) 20 MG capsule Take 1 capsule (20 mg total) by mouth 2 (two) times daily with a meal.     No facility-administered medications prior to visit.     PAST MEDICAL HISTORY: Past Medical History:  Diagnosis Date  . Anemia   . Anxiety   . Arthritis    "pretty much all my joints"  . Asthma   . Benign paroxysmal positional vertigo 12/30/2012  . Breast cancer ([place]) 02/13/12   ruq  100'clock bx Ductal Carcinoma in Situ,(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  . Breast mass in female    L breast 2008, US showed likely fat necrosis  . Chronic lower back pain   . CKD (chronic kidney disease), stage III    "lower stage" (01/06/2014)  . Daily headache   . Depression   . Gait abnormality 05/29/2016  . GERD (gastroesophageal reflux disease)   . History of blood transfusion 2011   "after one of my OR's"  . History of hiatal hernia   . History of stomach ulcers   . HSV (herpes simplex virus) infection   . Hx of radiation therapy 05/05/12- 07/15/12   right breast, 45 gray x 25 fx, lumpectomy cavity boosted to  16.2 gray  . Hyperlipemia   . Hypertension    sees Dr. Criss Rosales , [facility] [place]  . Hypothyroidism   . Kidney stones   . Knee pain, bilateral   . Migraines    "~ 3 times/month" (01/06/2014)  . Obesity   . OSA on CPAP    pt does not know settings  . Pancreatitis    hx of  . Personal history of radiation therapy 02/2012  . Pneumonia 1950's  . Polyneuropathy in diabetes(357.2)   . Schizophrenia ([place])   . 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 Tachycardia    with sx in 2008  . Thyroid disease   . Type II diabetes mellitus ([place])     PAST SURGICAL HISTORY: Past Surgical History:  Procedure Laterality Date  . ABDOMINAL HYSTERECTOMY  1979?   partial  . BREAST LUMPECTOMY  03/03/2012   Procedure: LUMPECTOMY;  Surgeon: Haywood Lasso, MD;  Location: [facility];  Service: General;  Laterality: Right;  . BREAST SURGERY Right 01/2012   "cancer"  . CHOLECYSTECTOMY  1980's?  . COLONOSCOPY WITH PROPOFOL N/A 09/28/2012   Procedure: COLONOSCOPY WITH PROPOFOL;  Surgeon: Lear Ng, MD;  Location: [facility];  Service: Endoscopy;  Laterality: N/A;  . FOOT FRACTURE SURGERY Right 1990's  . JOINT REPLACEMENT    . REVISION TOTAL KNEE ARTHROPLASTY  07/2009  . SHOULDER OPEN ROTATOR CUFF REPAIR Left 1990's  . THYROIDECTOMY  1970's  . TOTAL KNEE ARTHROPLASTY Bilateral 2009-06/2009   left; right    FAMILY HISTORY: Family History  Problem Relation Age of Onset  . Heart disease Brother        Multiple MIs, starting in his 47s  . Diabetes Brother   . Thyroid disease Brother   . Hypertension Mother   . Diabetes Mother   . Breast cancer Mother 55  . Bone cancer Mother   . Hypertension Sister   . Diabetes Sister   . Breast cancer Sister 55  . Thyroid disease Sister   . Breast  cancer Maternal Grandmother   . Heart disease Maternal Grandmother   . Uterine cancer Other 19  . Breast cancer Paternal Aunt 66  . Breast cancer Paternal Grandmother        dx in her 72s  . Prostate cancer  Paternal Grandfather   . Asthma Son     SOCIAL HISTORY: Social History   Social History  . Marital status: Divorced    Spouse name: N/A  . Number of children: 2  . Years of education: 12   Occupational History  . Disabled    Social History Main Topics  . Smoking status: Former Smoker    Packs/day: 1.00    Years: 7.00    Types: Cigarettes    Quit date: 03/24/1992  . Smokeless tobacco: Never Used  . 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 Drug use: No  . Sexual activity: No   Other Topics Concern  . Not on file   Social History Narrative   Single. Disabled Biochemist, clinical.          Patient is right handed.    Denies caffeine use       PHYSICAL EXAM  Vitals:   11/03/16 0953  BP: (!) 146/98  Pulse: 96  Weight: (!) 342 lb 12.8 oz (155.5 kg)   Body mass index is 57.04 kg/m.  Generalized: Well developed, in no acute distress   Neurological examination  Mentation: Alert oriented to time, place, history taking. Follows all commands speech and language fluent Cranial nerve II-XII: Pupils were equal round reactive to light. Extraocular movements were full, visual field were full on confrontational test. Facial sensation and strength were normal. Uvula tongue midline. Head turning and shoulder shrug  were normal and symmetric. Neck circumference 19 inches, Mallampati 4+ Motor: The motor testing reveals 5 over 5 strength of all 4 extremities. Good symmetric motor tone is noted throughout.  Sensory: Sensory testing is intact to soft touch on all 4 extremities. No evidence of extinction is noted.  Gait and station: Patient is in a wheelchair. Difficulty ambulating due to pain.    DIAGNOSTIC DATA (LABS, IMAGING, TESTING) - I reviewed patient records, labs, notes, testing and imaging myself where available.  Lab Results  Component Value Date   WBC 11.7 05/01/2016   HGB 11.0 (A) 05/01/2016   HCT 34 (A) 05/01/2016   MCV 80.4  04/24/2016   PLT 258 05/01/2016      Component Value Date/Time   NA 141 05/01/2016   NA 137 03/26/2015 0934   K 4.0 05/01/2016   K 4.4 03/26/2015 0934   CL 102 04/25/2016 0222   CL 115 (H) 11/15/2013 0500   CL 104 04/13/2012 1609   CO2 20 (L) 04/25/2016 0222   CO2 20 (L) 03/26/2015 0934   GLUCOSE 189 (H) 04/25/2016 0222   GLUCOSE 124 03/26/2015 0934   GLUCOSE 133 (H) 04/13/2012 1609   BUN 13 05/01/2016   BUN 30.8 (H) 03/26/2015 0934   CREATININE 0.9 05/01/2016   CREATININE 1.22 (H) 04/25/2016 0222   CREATININE 1.1 03/26/2015 0934   CALCIUM 8.4 (L) 08/22/2016 1342   CALCIUM 9.0 03/26/2015 0934   PROT 6.1 (L) 04/23/2016 1429   PROT 6.9 03/26/2015 0934   ALBUMIN 3.3 (L) 04/23/2016 1429   ALBUMIN 3.2 (L) 03/26/2015 0934   AST 19 04/23/2016 1429   AST 13 03/26/2015 0934   ALT 17 04/23/2016 1429   ALT 18 03/26/2015  0934   ALKPHOS 60 04/23/2016 1429   ALKPHOS 84 03/26/2015 0934   BILITOT 0.6 04/23/2016 1429   BILITOT <0.30 03/26/2015 0934   GFRNONAA 48 (L) 04/25/2016 0222   GFRNONAA 41 (L) 11/15/2013 0500   GFRAA 55 (L) 04/25/2016 0222   GFRAA 50 (L) 11/15/2013 0500   Lab Results  Component Value Date   CHOL 111 02/29/2016   HDL 55 02/29/2016   LDLCALC 35 02/29/2016   TRIG 104 02/29/2016   CHOLHDL 2.0 02/29/2016   Lab Results  Component Value Date   HGBA1C 6.1 (H) 02/16/2016   Lab Results  Component Value Date   VITAMINB12 929 (H) 04/01/2016   Lab Results  Component Value Date   TSH 1.63 08/22/2016      ASSESSMENT AND PLAN 61 y.o. year old female  has a past medical history of Anemia; Anxiety; Arthritis; Asthma; Benign paroxysmal positional vertigo (12/30/2012); Breast cancer ([place]) (02/13/12); Breast mass in female; Chronic lower back pain; CKD (chronic kidney disease), stage III; Daily headache; Depression; Gait abnormality (05/29/2016); GERD (gastroesophageal reflux disease); History of blood transfusion (2011); History of hiatal hernia; History of stomach  ulcers; HSV (herpes simplex virus) infection; radiation therapy (05/05/12- 07/15/12); Hyperlipemia; Hypertension; Hypothyroidism; Kidney stones; Knee pain, bilateral; Migraines; Obesity; OSA on CPAP; Pancreatitis; Personal history of radiation therapy (02/2012); Pneumonia (1950's); Polyneuropathy in diabetes(357.2); Schizophrenia ([place]); Secondary parkinsonism ([place]) (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 here with:  1. Obstructive sleep apnea on CPAP  The patient CPAP download shows excellent compliance in the treatment of her apnea. She does have a slight leak however it is not really affecting her residual AHI. I suggested she reach out to her DME company regarding the leak however she reports that she is unable to tolerate a different style mask. This may be the best we can do regards to reducing the leak. Patient is amenable to this. She'll follow-up in 6 months or sooner if needed    Ward Givens, MSN, NP-C 11/03/2016, 10:02 AM [facility] [address] [place], [place] [address] [phone]  I reviewed the above note and documentation by the Nurse Practitioner and agree with the history, physical exam, assessment and plan as outlined above. I was immediately available for face-to-face consultation. Star Age, MD, PhD [facility])

## 2016-11-03 NOTE — Telephone Encounter (Signed)
This was from a previous message, rx submitted to [facility].

## 2016-11-03 NOTE — Telephone Encounter (Signed)
Voltaren [employer]

## 2016-11-07 DIAGNOSIS — A6000 Herpesviral infection of urogenital system, unspecified: Secondary | ICD-10-CM | POA: Insufficient documentation

## 2016-11-11 ENCOUNTER — Telehealth (INDEPENDENT_AMBULATORY_CARE_PROVIDER_SITE_OTHER): Payer: Self-pay | Admitting: Orthopedic Surgery

## 2016-11-11 MED ORDER — DICLOFENAC SODIUM 75 MG PO TBEC: 75.0000 mg | DELAYED_RELEASE_TABLET | Freq: Two times a day (BID) | ORAL | 0 refills | Status: DC

## 2016-11-11 NOTE — Telephone Encounter (Signed)
rx for diclofenac tablets sent to her pharmacy.

## 2016-11-11 NOTE — Addendum Note (Signed)
Addended by: Maxcine Ham on: 11/11/2016 02:07 PM   Modules accepted: Orders

## 2016-11-11 NOTE — Telephone Encounter (Signed)
Patient called saying that the Voltaren cream in no longer covered by her insurance but the pill form is, if that could be sent in to her pharmacy. CB #  [phone]

## 2016-11-12 ENCOUNTER — Telehealth (INDEPENDENT_AMBULATORY_CARE_PROVIDER_SITE_OTHER): Payer: Self-pay | Admitting: Radiology

## 2016-11-12 ENCOUNTER — Other Ambulatory Visit (HOSPITAL_COMMUNITY): Payer: Self-pay | Admitting: Family Medicine

## 2016-11-12 DIAGNOSIS — R079 Chest pain, unspecified: Secondary | ICD-10-CM

## 2016-11-12 NOTE — Telephone Encounter (Signed)
Received phone call from [facility] in regards to patient being in the office and explaining to nurse that she was to be scheduled for surgery with Dr. Lorin Mercy and should not get another back injection. I advised patient is awaiting clearance for surgery to be scheduled and should not have another injection at this time.

## 2016-11-13 ENCOUNTER — Other Ambulatory Visit (HOSPITAL_COMMUNITY): Payer: Self-pay

## 2016-11-14 ENCOUNTER — Other Ambulatory Visit: Payer: Self-pay

## 2016-11-14 ENCOUNTER — Other Ambulatory Visit (HOSPITAL_COMMUNITY)
Admission: RE | Admit: 2016-11-14 | Discharge: 2016-11-14 | Disposition: A | Discharge status: Home / Self Care | Payer: Medicare Other | Source: Ambulatory Visit

## 2016-11-14 ENCOUNTER — Ambulatory Visit (HOSPITAL_COMMUNITY)
Admission: RE | Admit: 2016-11-14 | Discharge: 2016-11-14 | Disposition: A | Discharge status: Home / Self Care | Payer: Medicare Other | Source: Ambulatory Visit | Attending: Family Medicine | Admitting: Family Medicine

## 2016-11-14 DIAGNOSIS — R000 Tachycardia, unspecified: Secondary | ICD-10-CM | POA: Diagnosis not present

## 2016-11-14 DIAGNOSIS — Z01818 Encounter for other preprocedural examination: Secondary | ICD-10-CM | POA: Diagnosis present

## 2016-11-14 DIAGNOSIS — R9431 Abnormal electrocardiogram [ECG] [EKG]: Secondary | ICD-10-CM | POA: Diagnosis not present

## 2016-11-14 LAB — CBC WITH DIFFERENTIAL/PLATELET
BASOS ABS: 0.1 10*3/uL (ref 0.0–0.1)
BASOS PCT: 1 %
EOS ABS: 0.5 10*3/uL (ref 0.0–0.7)
EOS PCT: 6 %
HCT: 32.5 % — ABNORMAL LOW (ref 36.0–46.0)
Hemoglobin: 10.4 g/dL — ABNORMAL LOW (ref 12.0–15.0)
Lymphocytes Relative: 28 %
Lymphs Abs: 2.2 10*3/uL (ref 0.7–4.0)
MCH: 26.2 pg (ref 26.0–34.0)
MCHC: 32 g/dL (ref 30.0–36.0)
MCV: 81.9 fL (ref 78.0–100.0)
MONO ABS: 0.3 10*3/uL (ref 0.1–1.0)
MONOS PCT: 4 %
NEUTROS ABS: 4.7 10*3/uL (ref 1.7–7.7)
Neutrophils Relative %: 61 %
PLATELETS: 251 10*3/uL (ref 150–400)
RBC: 3.97 MIL/uL (ref 3.87–5.11)
RDW: 17.3 % — AB (ref 11.5–15.5)
WBC: 7.7 10*3/uL (ref 4.0–10.5)

## 2016-11-14 LAB — COMPREHENSIVE METABOLIC PANEL
ALBUMIN: 3.9 g/dL (ref 3.5–5.0)
ALT: 24 U/L (ref 14–54)
ANION GAP: 11 (ref 5–15)
AST: 25 U/L (ref 15–41)
Alkaline Phosphatase: 82 U/L (ref 38–126)
BILIRUBIN TOTAL: 0.6 mg/dL (ref 0.3–1.2)
BUN: 21 mg/dL — ABNORMAL HIGH (ref 6–20)
CHLORIDE: 98 mmol/L — AB (ref 101–111)
CO2: 24 mmol/L (ref 22–32)
Calcium: 9.3 mg/dL (ref 8.9–10.3)
Creatinine, Ser: 1.52 mg/dL — ABNORMAL HIGH (ref 0.44–1.00)
GFR calc Af Amer: 42 mL/min — ABNORMAL LOW (ref >60)
GFR calc non Af Amer: 36 mL/min — ABNORMAL LOW (ref >60)
GLUCOSE: 118 mg/dL — AB (ref 65–99)
POTASSIUM: 3.6 mmol/L (ref 3.5–5.1)
Sodium: 133 mmol/L — ABNORMAL LOW (ref 135–145)
Total Protein: 7.3 g/dL (ref 6.5–8.1)

## 2016-11-14 LAB — TSH: TSH: 4.769 u[IU]/mL — AB (ref 0.350–4.500)

## 2016-11-14 LAB — HEMOGLOBIN A1C
HEMOGLOBIN A1C: 6.8 % — AB (ref 4.8–5.6)
Mean Plasma Glucose: 148.46 mg/dL

## 2016-11-18 ENCOUNTER — Ambulatory Visit: Payer: Medicare Other | Admitting: Neurology

## 2016-11-25 ENCOUNTER — Telehealth (INDEPENDENT_AMBULATORY_CARE_PROVIDER_SITE_OTHER): Payer: Self-pay | Admitting: Orthopaedic Surgery

## 2016-11-25 NOTE — Telephone Encounter (Signed)
I spoke with patient. Her daughter is going to come and stay with her a few days after surgery and she has someone that comes in to her home for several hours a day. Her son was concerned about her staying alone. I did advise that she would be up and walking prior to leaving the hospital and it would be good for her to have someone there for the first little bit so that she can see how well she does being on the pain medication and getting around. She expressed understanding and states that she feels she will be fine as she has lived alone and has been in pain for a long time. She will call me if she has any other questions or concerns.

## 2016-11-25 NOTE — Telephone Encounter (Signed)
Pt is concerned about homecare after leaving surgery. I spoke with her and told her to talk the nurse on call and see if they could help.

## 2016-12-03 NOTE — Pre-Procedure Instructions (Signed)
[name]  12/03/2016      [address], [place] [address] Phone: [phone] Fax: [phone]    Your procedure is scheduled on Fri. Oct. 19  Report to [facility] Admitting at 8:00 A.M.  Call this number if you have problems the morning of surgery:  [phone]   Remember:  Do not eat food or drink liquids after midnight on Thurs. Oct.18   Take these medicines the morning of surgery with A SIP OF WATER : albuterol inhaler if needed-bring to hospital, symbocort-bring to hospital, benadryl if needed, nexium, levothyroxine(synthroid), linzess, lorazepam (ativan), lyrica, oxycodone,tizanidine,valtrex, venlafaxine (effexor-xr), ziprasidone (geodon)                How to Manage Your Diabetes Before and After Surgery  Why is it important to control my blood sugar before and after surgery? . Improving blood sugar levels before and after surgery helps healing and can limit problems. . A way of improving blood sugar control is eating a healthy diet by: o  Eating less sugar and carbohydrates o  Increasing activity/exercise o  Talking with your doctor about reaching your blood sugar goals . High blood sugars (greater than 180 mg/dL) can raise your risk of infections and slow your recovery, so you will need to focus on controlling your diabetes during the weeks before surgery. . Make sure that the doctor who takes care of your diabetes knows about your planned surgery including the date and location.  How do I manage my blood sugar before surgery? . Check your blood sugar at least 4 times a day, starting 2 days before surgery, to make sure that the level is not too high or low. o Check your blood sugar the morning of your surgery when you wake up and every 2 hours until you get to the Short Stay unit. . If your blood sugar is less than 70 mg/dL, you will need to treat for low  blood sugar: o Do not take insulin. o Treat a low blood sugar (less than 70 mg/dL) with  cup of clear juice (cranberry or apple), 4 glucose tablets, OR glucose gel. o Recheck blood sugar in 15 minutes after treatment (to make sure it is greater than 70 mg/dL). If your blood sugar is not greater than 70 mg/dL on recheck, call [phone] for further instructions. . Report your blood sugar to the short stay nurse when you get to Short Stay.  . If you are admitted to the hospital after surgery: o Your blood sugar will be checked by the staff and you will probably be given insulin after surgery (instead of oral diabetes medicines) to make sure you have good blood sugar levels. o The goal for blood sugar control after surgery is 80-180 mg/dL.      WHAT DO I DO ABOUT MY DIABETES MEDICATION?   [name] Do not take oral diabetes medicines (pills) the morning of surgery.  . THE NIGHT BEFORE SURGERY, take ________5___ units of _____levemir______insulin.       [name] HE MORNING OF SURGERY, take _____________ units of __________insulin.  . The day of surgery, do not take other diabetes injectables, including Byetta (exenatide), Bydureon (exenatide ER), Victoza (liraglutide), or Trulicity (dulaglutide).  . If your CBG is greater than 220 mg/dL, you may take  of your sliding scale (correction) dose of insulin.  Other Instructions:  Patient Signature:  Date:   Nurse Signature:  Date:   Reviewed and Endorsed by [facility] Patient Education Committee, August 2015   Do not wear jewelry, make-up or nail polish.  Do not wear lotions, powders, or perfumes, or deoderant.  Do not shave 48 hours prior to surgery.  Men may shave face and neck.  Do not bring valuables to the hospital.  [facility] is not responsible for any belongings or valuables.  Contacts, dentures or bridgework may not be worn into surgery.  Leave your suitcase in the car.  After surgery it may be brought to your room.  For  patients admitted to the hospital, discharge time will be determined by your treatment team.  Patients discharged the day of surgery will not be allowed to drive home.    Special instructions:  [employer] [facility]- Preparing For Surgery  Before surgery, you can play an important role. Because skin is not sterile, your skin needs to be as free of germs as possible. You can reduce the number of germs on your skin by washing with CHG (chlorahexidine gluconate) Soap before surgery.  CHG is an antiseptic cleaner which kills germs and bonds with the skin to continue killing germs even after washing.  Please do not use if you have an allergy to CHG or antibacterial soaps. If your skin becomes reddened/irritated stop using the CHG.  Do not shave (including legs and underarms) for at least 48 hours prior to first CHG shower. It is OK to shave your face.  Please follow these instructions carefully.   1. Shower the NIGHT BEFORE SURGERY and the MORNING OF SURGERY with CHG.   2. If you chose to wash your hair, wash your hair first as usual with your normal shampoo.  3. After you shampoo, rinse your hair and body thoroughly to remove the shampoo.  4. Use CHG as you would any other liquid soap. You can apply CHG directly to the skin and wash gently with a scrungie or a clean washcloth.   5. Apply the CHG Soap to your body ONLY FROM THE NECK DOWN.  Do not use on open wounds or open sores. Avoid contact with your eyes, ears, mouth and genitals (private parts). Wash Face and genitals (private parts)  with your normal soap.  6. Wash thoroughly, paying special attention to the area where your surgery will be performed.  7. Thoroughly rinse your body with warm water from the neck down.  8. DO NOT shower/wash with your normal soap after using and rinsing off the CHG Soap.  9. Pat yourself dry with a CLEAN TOWEL.  10. Wear CLEAN PAJAMAS to bed the night before surgery, wear comfortable clothes the morning of  surgery  11. Place CLEAN SHEETS on your bed the night of your first shower and DO NOT SLEEP WITH PETS.    Day of Surgery: Do not apply any deodorants/lotions. Please wear clean clothes to the hospital/surgery center.      Please read over the following fact sheets that you were given. Coughing and Deep Breathing and Surgical Site Infection Prevention

## 2016-12-04 ENCOUNTER — Encounter (HOSPITAL_COMMUNITY)
Admission: RE | Admit: 2016-12-04 | Discharge: 2016-12-04 | Disposition: A | Discharge status: Home / Self Care | Payer: Medicare Other | Source: Ambulatory Visit | Attending: Orthopaedic Surgery | Admitting: Orthopaedic Surgery

## 2016-12-04 ENCOUNTER — Encounter (HOSPITAL_COMMUNITY): Payer: Self-pay

## 2016-12-04 DIAGNOSIS — I129 Hypertensive chronic kidney disease with stage 1 through stage 4 chronic kidney disease, or unspecified chronic kidney disease: Secondary | ICD-10-CM | POA: Diagnosis not present

## 2016-12-04 DIAGNOSIS — G4733 Obstructive sleep apnea (adult) (pediatric): Secondary | ICD-10-CM | POA: Diagnosis not present

## 2016-12-04 DIAGNOSIS — F209 Schizophrenia, unspecified: Secondary | ICD-10-CM | POA: Diagnosis not present

## 2016-12-04 DIAGNOSIS — Z87891 Personal history of nicotine dependence: Secondary | ICD-10-CM | POA: Diagnosis not present

## 2016-12-04 DIAGNOSIS — Z794 Long term (current) use of insulin: Secondary | ICD-10-CM | POA: Diagnosis not present

## 2016-12-04 DIAGNOSIS — E1142 Type 2 diabetes mellitus with diabetic polyneuropathy: Secondary | ICD-10-CM | POA: Diagnosis not present

## 2016-12-04 DIAGNOSIS — K219 Gastro-esophageal reflux disease without esophagitis: Secondary | ICD-10-CM | POA: Diagnosis not present

## 2016-12-04 DIAGNOSIS — M1990 Unspecified osteoarthritis, unspecified site: Secondary | ICD-10-CM | POA: Diagnosis not present

## 2016-12-04 DIAGNOSIS — F419 Anxiety disorder, unspecified: Secondary | ICD-10-CM | POA: Diagnosis not present

## 2016-12-04 DIAGNOSIS — H8110 Benign paroxysmal vertigo, unspecified ear: Secondary | ICD-10-CM | POA: Diagnosis not present

## 2016-12-04 DIAGNOSIS — D649 Anemia, unspecified: Secondary | ICD-10-CM | POA: Diagnosis not present

## 2016-12-04 DIAGNOSIS — J45909 Unspecified asthma, uncomplicated: Secondary | ICD-10-CM | POA: Diagnosis not present

## 2016-12-04 DIAGNOSIS — E1122 Type 2 diabetes mellitus with diabetic chronic kidney disease: Secondary | ICD-10-CM | POA: Diagnosis not present

## 2016-12-04 DIAGNOSIS — F329 Major depressive disorder, single episode, unspecified: Secondary | ICD-10-CM | POA: Diagnosis not present

## 2016-12-04 DIAGNOSIS — E669 Obesity, unspecified: Secondary | ICD-10-CM | POA: Diagnosis not present

## 2016-12-04 DIAGNOSIS — E785 Hyperlipidemia, unspecified: Secondary | ICD-10-CM | POA: Diagnosis not present

## 2016-12-04 DIAGNOSIS — Z79899 Other long term (current) drug therapy: Secondary | ICD-10-CM | POA: Diagnosis not present

## 2016-12-04 DIAGNOSIS — Z7982 Long term (current) use of aspirin: Secondary | ICD-10-CM | POA: Diagnosis not present

## 2016-12-04 DIAGNOSIS — Z853 Personal history of malignant neoplasm of breast: Secondary | ICD-10-CM | POA: Diagnosis not present

## 2016-12-04 DIAGNOSIS — M5117 Intervertebral disc disorders with radiculopathy, lumbosacral region: Secondary | ICD-10-CM | POA: Diagnosis not present

## 2016-12-04 DIAGNOSIS — N183 Chronic kidney disease, stage 3 (moderate): Secondary | ICD-10-CM | POA: Diagnosis not present

## 2016-12-04 DIAGNOSIS — Z87442 Personal history of urinary calculi: Secondary | ICD-10-CM | POA: Diagnosis not present

## 2016-12-04 DIAGNOSIS — M5126 Other intervertebral disc displacement, lumbar region: Secondary | ICD-10-CM | POA: Diagnosis not present

## 2016-12-04 DIAGNOSIS — E039 Hypothyroidism, unspecified: Secondary | ICD-10-CM | POA: Diagnosis not present

## 2016-12-04 HISTORY — DX: Dyspnea, unspecified: R06.00

## 2016-12-04 LAB — URINALYSIS, ROUTINE W REFLEX MICROSCOPIC
BILIRUBIN URINE: NEGATIVE
Bacteria, UA: NONE SEEN
GLUCOSE, UA: NEGATIVE mg/dL
KETONES UR: NEGATIVE mg/dL
Leukocytes, UA: NEGATIVE
NITRITE: NEGATIVE
PH: 6 (ref 5.0–8.0)
PROTEIN: NEGATIVE mg/dL
RBC / HPF: NONE SEEN RBC/hpf (ref 0–5)
Specific Gravity, Urine: 1.005 (ref 1.005–1.030)

## 2016-12-04 LAB — COMPREHENSIVE METABOLIC PANEL
ALT: 23 U/L (ref 14–54)
AST: 21 U/L (ref 15–41)
Albumin: 3.7 g/dL (ref 3.5–5.0)
Alkaline Phosphatase: 94 U/L (ref 38–126)
Anion gap: 10 (ref 5–15)
BUN: 16 mg/dL (ref 6–20)
CHLORIDE: 98 mmol/L — AB (ref 101–111)
CO2: 27 mmol/L (ref 22–32)
Calcium: 9.2 mg/dL (ref 8.9–10.3)
Creatinine, Ser: 1.29 mg/dL — ABNORMAL HIGH (ref 0.44–1.00)
GFR, EST AFRICAN AMERICAN: 51 mL/min — AB (ref >60)
GFR, EST NON AFRICAN AMERICAN: 44 mL/min — AB (ref >60)
Glucose, Bld: 111 mg/dL — ABNORMAL HIGH (ref 65–99)
POTASSIUM: 4.1 mmol/L (ref 3.5–5.1)
SODIUM: 135 mmol/L (ref 135–145)
Total Bilirubin: 0.4 mg/dL (ref 0.3–1.2)
Total Protein: 6.9 g/dL (ref 6.5–8.1)

## 2016-12-04 LAB — CBC
HCT: 32.1 % — ABNORMAL LOW (ref 36.0–46.0)
Hemoglobin: 9.9 g/dL — ABNORMAL LOW (ref 12.0–15.0)
MCH: 25.7 pg — AB (ref 26.0–34.0)
MCHC: 30.8 g/dL (ref 30.0–36.0)
MCV: 83.4 fL (ref 78.0–100.0)
PLATELETS: 230 10*3/uL (ref 150–400)
RBC: 3.85 MIL/uL — AB (ref 3.87–5.11)
RDW: 18 % — AB (ref 11.5–15.5)
WBC: 9.7 10*3/uL (ref 4.0–10.5)

## 2016-12-04 LAB — SURGICAL PCR SCREEN
MRSA, PCR: POSITIVE — AB
Staphylococcus aureus: POSITIVE — AB

## 2016-12-04 LAB — PROTIME-INR
INR: 0.99
PROTHROMBIN TIME: 13 s (ref 11.4–15.2)

## 2016-12-04 LAB — APTT: aPTT: 30 seconds (ref 24–36)

## 2016-12-04 MED ORDER — DEXTROSE 5 % IV SOLN
3.0000 g | INTRAVENOUS | Status: DC
  Filled 2016-12-04: qty 3000

## 2016-12-04 NOTE — Pre-Procedure Instructions (Addendum)
[name]  12/04/2016      [address], [place] [address] Phone: [phone] Fax: [phone]    Your procedure is scheduled on Fri. Oct. 19th               Report to [facility] Admitting at 8:00 A.M.             (posted surgery time 10:37m- 12:00noon.   Call this number if you have problems the MORNING of surgery:  [phone].   Remember:               4-5 days prior to surgery, STOP TAKING any Vitamins, Herbal Supplements, Anti-inflammtories, Blood Thinners.   Do not eat food or drink liquids after midnight on Thurs. Oct.18   Take these medicines the morning of surgery with A SIP OF WATER : albuterol inhaler if needed-bring to hospital, symbocort-bring to hospital, benadryl if needed, nexium, levothyroxine(synthroid), linzess, lorazepam (ativan), lyrica, oxycodone,tizanidine,valtrex, venlafaxine (effexor-xr), ziprasidone (geodon)                How to Manage Your Diabetes Before and After Surgery  Why is it important to control my blood sugar before and after surgery? . Improving blood sugar levels before and after surgery helps healing and can limit problems. . A way of improving blood sugar control is eating a healthy diet by: o  Eating less sugar and carbohydrates o  Increasing activity/exercise o  Talking with your doctor about reaching your blood sugar goals . High blood sugars (greater than 180 mg/dL) can raise your risk of infections and slow your recovery, so you will need to focus on controlling your diabetes during the weeks before surgery. . Make sure that the doctor who takes care of your diabetes knows about your planned surgery including the date and location.  How do I manage my blood sugar before surgery? . Check your blood sugar at least 4 times a day, starting 2 days before surgery, to make sure that the level is not too high or  low. o Check your blood sugar the morning of your surgery when you wake up and every 2 hours until you get to the Short Stay unit.  . If your blood sugar is less than 70 mg/dL, you will need to treat for low blood sugar: o Do not take insulin. o Treat a low blood sugar (less than 70 mg/dL) with  cup of clear juice (cranberry or apple), 4 glucose tablets, OR glucose gel.  o Recheck blood sugar in 15 minutes after treatment (to make sure it is greater than 70 mg/dL). If your blood sugar is not greater than 70 mg/dL on recheck, call [phone] further instructions. . Report your blood sugar to the short stay nurse when you get to Short Stay.  . If you are admitted to the hospital after surgery: o Your blood sugar will be checked by the staff and you will probably be given insulin after surgery (instead of oral diabetes medicines) to make sure you have good blood sugar levels. o The goal for blood sugar control after surgery is 80-180 mg/dL.      WHAT DO I DO ABOUT MY DIABETES MEDICATION?   .Marland KitchenDo not take oral diabetes medicines (pills) the morning of surgery.  . THE NIGHT BEFORE SURGERY, take  5_ units of  levemir_insulin.  . The day of surgery,  do not take other diabetes injectables, including Byetta (exenatide), Bydureon (exenatide ER), Victoza (liraglutide), or Trulicity (dulaglutide).  . If your CBG is greater than 220 mg/dL, you may take  of your sliding scale (correction) dose of insulin.  Other Instructions:          Patient Signature:  Date:   Nurse Signature:  Date:   Reviewed and Endorsed by [facility] Patient Education Committee, August 2015   Do not wear jewelry, make-up or nail polish.  Do not wear lotions, powders,  perfumes, or deoderant.  Do not shave 48 hours prior to surgery.     Do not bring valuables to the hospital.  [facility] is not responsible for any belongings or valuables.  Contacts, dentures or bridgework may not be worn into surgery.   Leave your suitcase in the car.  After surgery it may be brought to your room.  For patients admitted to the hospital, discharge time will be determined by your treatment team.     Special instructions:  - Preparing For Surgery  Before surgery, you can play an important role. Because skin is not sterile, your skin needs to be as free of germs as possible. You can reduce the number of germs on your skin by washing with CHG (chlorahexidine gluconate) Soap before surgery.  CHG is an antiseptic cleaner which kills germs and bonds with the skin to continue killing germs even after washing.  Please do not use if you have an allergy to CHG or antibacterial soaps. If your skin becomes reddened/irritated stop using the CHG.  Do not shave (including legs and underarms) for at least 48 hours prior to first CHG shower. It is OK to shave your face.  Please follow these instructions carefully.   1. Shower the NIGHT BEFORE SURGERY and the MORNING OF SURGERY with CHG.   2. If you chose to wash your hair, wash your hair first as usual with your normal shampoo.  3. After you shampoo, rinse your hair and body thoroughly to remove the shampoo.  4. Use CHG as you would any other liquid soap. You can apply CHG directly to the skin and wash gently with a scrungie or a clean washcloth.   5. Apply the CHG Soap to your body ONLY FROM THE NECK DOWN.  Do not use on open wounds or open sores. Avoid contact with your eyes, ears, mouth and genitals (private parts). Wash Face and genitals (private parts)  with your normal soap.  6. Wash thoroughly, paying special attention to the area where your surgery will be performed.  7. Thoroughly rinse your body with warm water from the neck down.  8. DO NOT shower/wash with your normal soap after using and rinsing off the CHG Soap.  9. Pat yourself dry with a CLEAN TOWEL.  10. Wear CLEAN PAJAMAS to bed the night before surgery, wear comfortable clothes the  morning of surgery  11. Place CLEAN SHEETS on your bed the night of your first shower and DO NOT SLEEP WITH PETS.  Day of Surgery: Do not apply any deodorants/lotions. Please wear clean clothes to the hospital/surgery center.      Please read over the following fact sheets that you were given. Coughing and Deep Breathing and Surgical Site Infection Prevention

## 2016-12-04 NOTE — Progress Notes (Signed)
PCP is Audrea Muscat Hazy  LOV  10/2016 Endocrinologist is Dr. Cordelia Pen  LOV 08/2016   Last A1C 6.8 She states her blood sugars run 130-160 Pulmonolgist is Dr. Melvyn Novas  LOV 10/2016 H/O breast cancer with lumpectomy & radiation in 2013.  She states that 3-4 yrs ago, "got an infection in my heart"  She says, the cause was found to be her  CPAP mask.  She never bothered to clean it, taped holes up with tape, etc.  Did see Dr. Acie Fredrickson at that time.  Has not been back. Denies murmur, cp. Had Echo done 03/2016.

## 2016-12-05 ENCOUNTER — Other Ambulatory Visit: Payer: Self-pay

## 2016-12-05 ENCOUNTER — Encounter (HOSPITAL_COMMUNITY): Payer: Self-pay | Admitting: *Deleted

## 2016-12-05 ENCOUNTER — Ambulatory Visit (HOSPITAL_COMMUNITY): Payer: Medicare Other | Admitting: Certified Registered"

## 2016-12-05 ENCOUNTER — Observation Stay (HOSPITAL_COMMUNITY)
Admission: RE | Admit: 2016-12-05 | Discharge: 2016-12-06 | Disposition: A | Discharge status: Home / Self Care | Payer: Medicare Other | Source: Ambulatory Visit | Attending: Orthopaedic Surgery | Admitting: Orthopaedic Surgery

## 2016-12-05 ENCOUNTER — Encounter (HOSPITAL_COMMUNITY)
Admission: RE | Disposition: A | Discharge status: Home / Self Care | Payer: Self-pay | Source: Ambulatory Visit | Attending: Orthopaedic Surgery

## 2016-12-05 ENCOUNTER — Ambulatory Visit (HOSPITAL_COMMUNITY): Payer: Medicare Other

## 2016-12-05 DIAGNOSIS — Z6841 Body Mass Index (BMI) 40.0 and over, adult: Secondary | ICD-10-CM | POA: Insufficient documentation

## 2016-12-05 DIAGNOSIS — E039 Hypothyroidism, unspecified: Secondary | ICD-10-CM | POA: Insufficient documentation

## 2016-12-05 DIAGNOSIS — H8110 Benign paroxysmal vertigo, unspecified ear: Secondary | ICD-10-CM | POA: Insufficient documentation

## 2016-12-05 DIAGNOSIS — M5126 Other intervertebral disc displacement, lumbar region: Secondary | ICD-10-CM | POA: Diagnosis present

## 2016-12-05 DIAGNOSIS — K219 Gastro-esophageal reflux disease without esophagitis: Secondary | ICD-10-CM | POA: Insufficient documentation

## 2016-12-05 DIAGNOSIS — F419 Anxiety disorder, unspecified: Secondary | ICD-10-CM | POA: Diagnosis not present

## 2016-12-05 DIAGNOSIS — Z853 Personal history of malignant neoplasm of breast: Secondary | ICD-10-CM | POA: Insufficient documentation

## 2016-12-05 DIAGNOSIS — Z79899 Other long term (current) drug therapy: Secondary | ICD-10-CM | POA: Insufficient documentation

## 2016-12-05 DIAGNOSIS — Z419 Encounter for procedure for purposes other than remedying health state, unspecified: Secondary | ICD-10-CM

## 2016-12-05 DIAGNOSIS — M1990 Unspecified osteoarthritis, unspecified site: Secondary | ICD-10-CM | POA: Insufficient documentation

## 2016-12-05 DIAGNOSIS — Z87891 Personal history of nicotine dependence: Secondary | ICD-10-CM | POA: Insufficient documentation

## 2016-12-05 DIAGNOSIS — M5127 Other intervertebral disc displacement, lumbosacral region: Secondary | ICD-10-CM | POA: Diagnosis not present

## 2016-12-05 DIAGNOSIS — F209 Schizophrenia, unspecified: Secondary | ICD-10-CM | POA: Insufficient documentation

## 2016-12-05 DIAGNOSIS — Z7982 Long term (current) use of aspirin: Secondary | ICD-10-CM | POA: Insufficient documentation

## 2016-12-05 DIAGNOSIS — D649 Anemia, unspecified: Secondary | ICD-10-CM | POA: Insufficient documentation

## 2016-12-05 DIAGNOSIS — E669 Obesity, unspecified: Secondary | ICD-10-CM | POA: Insufficient documentation

## 2016-12-05 DIAGNOSIS — J45909 Unspecified asthma, uncomplicated: Secondary | ICD-10-CM | POA: Insufficient documentation

## 2016-12-05 DIAGNOSIS — M5117 Intervertebral disc disorders with radiculopathy, lumbosacral region: Principal | ICD-10-CM | POA: Insufficient documentation

## 2016-12-05 DIAGNOSIS — I129 Hypertensive chronic kidney disease with stage 1 through stage 4 chronic kidney disease, or unspecified chronic kidney disease: Secondary | ICD-10-CM | POA: Insufficient documentation

## 2016-12-05 DIAGNOSIS — F329 Major depressive disorder, single episode, unspecified: Secondary | ICD-10-CM | POA: Insufficient documentation

## 2016-12-05 DIAGNOSIS — E1142 Type 2 diabetes mellitus with diabetic polyneuropathy: Secondary | ICD-10-CM | POA: Insufficient documentation

## 2016-12-05 DIAGNOSIS — Z87442 Personal history of urinary calculi: Secondary | ICD-10-CM | POA: Insufficient documentation

## 2016-12-05 DIAGNOSIS — Z794 Long term (current) use of insulin: Secondary | ICD-10-CM | POA: Insufficient documentation

## 2016-12-05 DIAGNOSIS — N183 Chronic kidney disease, stage 3 (moderate): Secondary | ICD-10-CM | POA: Insufficient documentation

## 2016-12-05 DIAGNOSIS — E1122 Type 2 diabetes mellitus with diabetic chronic kidney disease: Secondary | ICD-10-CM | POA: Insufficient documentation

## 2016-12-05 DIAGNOSIS — G4733 Obstructive sleep apnea (adult) (pediatric): Secondary | ICD-10-CM | POA: Insufficient documentation

## 2016-12-05 DIAGNOSIS — E785 Hyperlipidemia, unspecified: Secondary | ICD-10-CM | POA: Insufficient documentation

## 2016-12-05 DIAGNOSIS — E162 Hypoglycemia, unspecified: Secondary | ICD-10-CM | POA: Insufficient documentation

## 2016-12-05 HISTORY — PX: LUMBAR LAMINECTOMY/DECOMPRESSION MICRODISCECTOMY: SHX5026

## 2016-12-05 LAB — GLUCOSE, CAPILLARY
GLUCOSE-CAPILLARY: 101 mg/dL — AB (ref 65–99)
Glucose-Capillary: 103 mg/dL — ABNORMAL HIGH (ref 65–99)
Glucose-Capillary: 101 mg/dL — ABNORMAL HIGH (ref 65–99)
Glucose-Capillary: 197 mg/dL — ABNORMAL HIGH (ref 65–99)
Glucose-Capillary: 153 mg/dL — ABNORMAL HIGH (ref 65–99)

## 2016-12-05 SURGERY — LUMBAR LAMINECTOMY/DECOMPRESSION MICRODISCECTOMY
Anesthesia: General | Site: Back

## 2016-12-05 MED ORDER — 0.9 % SODIUM CHLORIDE (POUR BTL) OPTIME
TOPICAL | Status: DC | PRN
  Administered 2016-12-05: 1000 mL

## 2016-12-05 MED ORDER — LOSARTAN POTASSIUM-HCTZ 100-12.5 MG PO TABS: 1.0000 | ORAL_TABLET | Freq: Every day | ORAL | Status: DC

## 2016-12-05 MED ORDER — POLYETHYLENE GLYCOL 3350 17 G PO PACK: 17.0000 g | PACK | Freq: Every day | ORAL | Status: DC | PRN

## 2016-12-05 MED ORDER — FENTANYL CITRATE (PF) 100 MCG/2ML IJ SOLN
INTRAMUSCULAR | Status: AC
  Administered 2016-12-05: 50 ug via INTRAVENOUS
  Filled 2016-12-05: qty 2

## 2016-12-05 MED ORDER — KETOROLAC TROMETHAMINE 30 MG/ML IJ SOLN
INTRAMUSCULAR | Status: AC
  Filled 2016-12-05: qty 1

## 2016-12-05 MED ORDER — PROPOFOL 10 MG/ML IV BOLUS
INTRAVENOUS | Status: DC | PRN
  Administered 2016-12-05: 200 mg via INTRAVENOUS

## 2016-12-05 MED ORDER — HYDROMORPHONE HCL 1 MG/ML IJ SOLN
INTRAMUSCULAR | Status: AC
  Administered 2016-12-05: 0.5 mg via INTRAVENOUS
  Filled 2016-12-05: qty 1

## 2016-12-05 MED ORDER — BUPIVACAINE HCL (PF) 0.25 % IJ SOLN
INTRAMUSCULAR | Status: AC
  Filled 2016-12-05: qty 30

## 2016-12-05 MED ORDER — METOCLOPRAMIDE HCL 5 MG/ML IJ SOLN: 10.0000 mg | Freq: Once | INTRAMUSCULAR | Status: DC | PRN

## 2016-12-05 MED ORDER — SUGAMMADEX SODIUM 500 MG/5ML IV SOLN
INTRAVENOUS | Status: DC | PRN
  Administered 2016-12-05: 300 mg via INTRAVENOUS

## 2016-12-05 MED ORDER — ACETAMINOPHEN 650 MG RE SUPP: 650.0000 mg | RECTAL | Status: DC | PRN

## 2016-12-05 MED ORDER — LEVOTHYROXINE SODIUM 150 MCG PO TABS
150.0000 ug | ORAL_TABLET | Freq: Every day | ORAL | Status: DC
  Filled 2016-12-05 (×2): qty 1

## 2016-12-05 MED ORDER — VITAMIN D 1000 UNITS PO TABS
1000.0000 [IU] | ORAL_TABLET | Freq: Every day | ORAL | Status: DC
  Administered 2016-12-05 – 2016-12-06 (×2): 1000 [IU] via ORAL
  Filled 2016-12-05 (×2): qty 1

## 2016-12-05 MED ORDER — MIDAZOLAM HCL 5 MG/5ML IJ SOLN
INTRAMUSCULAR | Status: DC | PRN
  Administered 2016-12-05: 2 mg via INTRAVENOUS

## 2016-12-05 MED ORDER — ONDANSETRON HCL 4 MG/2ML IJ SOLN: 4.0000 mg | Freq: Four times a day (QID) | INTRAMUSCULAR | Status: DC | PRN

## 2016-12-05 MED ORDER — MENTHOL 3 MG MT LOZG: 1.0000 | LOZENGE | OROMUCOSAL | Status: DC | PRN

## 2016-12-05 MED ORDER — METFORMIN HCL 500 MG PO TABS
500.0000 mg | ORAL_TABLET | Freq: Two times a day (BID) | ORAL | Status: DC
  Administered 2016-12-06: 500 mg via ORAL
  Filled 2016-12-05 (×2): qty 1

## 2016-12-05 MED ORDER — FENTANYL CITRATE (PF) 100 MCG/2ML IJ SOLN
INTRAMUSCULAR | Status: DC | PRN
  Administered 2016-12-05: 100 ug via INTRAVENOUS
  Administered 2016-12-05 (×3): 50 ug via INTRAVENOUS
  Administered 2016-12-05: 100 ug via INTRAVENOUS
  Administered 2016-12-05: 50 ug via INTRAVENOUS

## 2016-12-05 MED ORDER — TIZANIDINE HCL 4 MG PO TABS
4.0000 mg | ORAL_TABLET | Freq: Two times a day (BID) | ORAL | Status: DC
  Administered 2016-12-05 – 2016-12-06 (×2): 4 mg via ORAL
  Filled 2016-12-05 (×2): qty 1

## 2016-12-05 MED ORDER — MOMETASONE FURO-FORMOTEROL FUM 100-5 MCG/ACT IN AERO
2.0000 | INHALATION_SPRAY | Freq: Two times a day (BID) | RESPIRATORY_TRACT | Status: DC
  Administered 2016-12-06: 2 via RESPIRATORY_TRACT
  Filled 2016-12-05: qty 8.8

## 2016-12-05 MED ORDER — LIDOCAINE 2% (20 MG/ML) 5 ML SYRINGE
INTRAMUSCULAR | Status: AC
  Filled 2016-12-05: qty 5

## 2016-12-05 MED ORDER — PHENOL 1.4 % MT LIQD: 1.0000 | OROMUCOSAL | Status: DC | PRN

## 2016-12-05 MED ORDER — FUROSEMIDE 40 MG PO TABS
40.0000 mg | ORAL_TABLET | Freq: Every day | ORAL | Status: DC
  Administered 2016-12-06: 40 mg via ORAL
  Filled 2016-12-05: qty 1

## 2016-12-05 MED ORDER — CHLORHEXIDINE GLUCONATE 4 % EX LIQD: 60.0000 mL | Freq: Once | CUTANEOUS | Status: DC

## 2016-12-05 MED ORDER — FENTANYL CITRATE (PF) 250 MCG/5ML IJ SOLN
INTRAMUSCULAR | Status: AC
  Filled 2016-12-05: qty 5

## 2016-12-05 MED ORDER — LIDOCAINE HCL (CARDIAC) 20 MG/ML IV SOLN
INTRAVENOUS | Status: DC | PRN
  Administered 2016-12-05: 100 mg via INTRAVENOUS

## 2016-12-05 MED ORDER — SODIUM CHLORIDE 0.9% FLUSH
3.0000 mL | Freq: Two times a day (BID) | INTRAVENOUS | Status: DC
  Administered 2016-12-05 (×2): 3 mL via INTRAVENOUS

## 2016-12-05 MED ORDER — DOXEPIN HCL 50 MG PO CAPS
50.0000 mg | ORAL_CAPSULE | Freq: Every day | ORAL | Status: DC
  Administered 2016-12-05: 50 mg via ORAL
  Filled 2016-12-05: qty 1

## 2016-12-05 MED ORDER — LACTATED RINGERS IV SOLN
INTRAVENOUS | Status: DC
  Administered 2016-12-05 (×2): via INTRAVENOUS

## 2016-12-05 MED ORDER — LOSARTAN POTASSIUM 50 MG PO TABS
100.0000 mg | ORAL_TABLET | Freq: Every day | ORAL | Status: DC
  Filled 2016-12-05: qty 2

## 2016-12-05 MED ORDER — HYDROMORPHONE HCL 1 MG/ML IJ SOLN
0.5000 mg | INTRAMUSCULAR | Status: DC | PRN
  Filled 2016-12-05: qty 0.5

## 2016-12-05 MED ORDER — SITAGLIPTIN PHOS-METFORMIN HCL 50-500 MG PO TABS: 1.0000 | ORAL_TABLET | Freq: Two times a day (BID) | ORAL | Status: DC

## 2016-12-05 MED ORDER — ROSUVASTATIN CALCIUM 20 MG PO TABS
20.0000 mg | ORAL_TABLET | Freq: Every day | ORAL | Status: DC
  Administered 2016-12-06: 20 mg via ORAL
  Filled 2016-12-05: qty 1

## 2016-12-05 MED ORDER — OXYCODONE HCL 5 MG PO TABS
5.0000 mg | ORAL_TABLET | ORAL | Status: DC | PRN
  Administered 2016-12-05 – 2016-12-06 (×5): 10 mg via ORAL
  Filled 2016-12-05 (×4): qty 2

## 2016-12-05 MED ORDER — PREGABALIN 75 MG PO CAPS
150.0000 mg | ORAL_CAPSULE | Freq: Two times a day (BID) | ORAL | Status: DC
  Administered 2016-12-05 – 2016-12-06 (×2): 150 mg via ORAL
  Filled 2016-12-05 (×2): qty 2

## 2016-12-05 MED ORDER — ZIPRASIDONE HCL 40 MG PO CAPS
40.0000 mg | ORAL_CAPSULE | Freq: Every day | ORAL | Status: DC
  Administered 2016-12-05: 40 mg via ORAL
  Filled 2016-12-05 (×3): qty 1

## 2016-12-05 MED ORDER — ONDANSETRON HCL 4 MG PO TABS: 4.0000 mg | ORAL_TABLET | Freq: Four times a day (QID) | ORAL | Status: DC | PRN

## 2016-12-05 MED ORDER — SODIUM CHLORIDE 0.9% FLUSH: 3.0000 mL | INTRAVENOUS | Status: DC | PRN

## 2016-12-05 MED ORDER — CEFAZOLIN SODIUM-DEXTROSE 1-4 GM/50ML-% IV SOLN
1.0000 g | Freq: Three times a day (TID) | INTRAVENOUS | Status: AC
  Administered 2016-12-05 (×2): 1 g via INTRAVENOUS
  Filled 2016-12-05 (×2): qty 50

## 2016-12-05 MED ORDER — OXYCODONE HCL 5 MG PO TABS
ORAL_TABLET | ORAL | Status: AC
  Administered 2016-12-05: 10 mg via ORAL
  Filled 2016-12-05: qty 2

## 2016-12-05 MED ORDER — INSULIN ASPART 100 UNIT/ML ~~LOC~~ SOLN
10.0000 [IU] | Freq: Three times a day (TID) | SUBCUTANEOUS | Status: DC
  Administered 2016-12-06: 10 [IU] via SUBCUTANEOUS

## 2016-12-05 MED ORDER — VANCOMYCIN HCL 10 G IV SOLR
1500.0000 mg | Freq: Once | INTRAVENOUS | Status: AC
  Administered 2016-12-05: 1500 mg via INTRAVENOUS
  Filled 2016-12-05: qty 1500

## 2016-12-05 MED ORDER — LORAZEPAM 1 MG PO TABS: 1.0000 mg | ORAL_TABLET | ORAL | Status: DC

## 2016-12-05 MED ORDER — LINAGLIPTIN 5 MG PO TABS
5.0000 mg | ORAL_TABLET | Freq: Two times a day (BID) | ORAL | Status: DC
  Filled 2016-12-05 (×2): qty 1

## 2016-12-05 MED ORDER — OXYCODONE-ACETAMINOPHEN 10-325 MG PO TABS: 1.0000 | ORAL_TABLET | Freq: Four times a day (QID) | ORAL | 0 refills | Status: DC | PRN

## 2016-12-05 MED ORDER — KETOROLAC TROMETHAMINE 30 MG/ML IJ SOLN
30.0000 mg | Freq: Once | INTRAMUSCULAR | Status: AC
  Administered 2016-12-05: 30 mg via INTRAVENOUS

## 2016-12-05 MED ORDER — HUMALOG JUNIOR KWIKPEN 100 UNIT/ML ~~LOC~~ SOPN
10.0000 [IU] | PEN_INJECTOR | Freq: Three times a day (TID) | SUBCUTANEOUS | Status: DC
  Filled 2016-12-05: qty 3

## 2016-12-05 MED ORDER — LINACLOTIDE 290 MCG PO CAPS
290.0000 ug | ORAL_CAPSULE | Freq: Every day | ORAL | Status: DC
  Filled 2016-12-05: qty 1

## 2016-12-05 MED ORDER — CALCITRIOL 0.25 MCG PO CAPS
0.2500 ug | ORAL_CAPSULE | ORAL | Status: DC
  Administered 2016-12-05: 0.25 ug via ORAL
  Filled 2016-12-05: qty 1

## 2016-12-05 MED ORDER — HYDROMORPHONE HCL 1 MG/ML IJ SOLN
0.5000 mg | INTRAMUSCULAR | Status: DC | PRN
  Administered 2016-12-05 (×3): 0.5 mg via INTRAVENOUS

## 2016-12-05 MED ORDER — ALBUTEROL SULFATE (2.5 MG/3ML) 0.083% IN NEBU: 3.0000 mL | INHALATION_SOLUTION | RESPIRATORY_TRACT | Status: DC | PRN

## 2016-12-05 MED ORDER — SODIUM CHLORIDE 0.9 % IV SOLN: INTRAVENOUS | Status: DC

## 2016-12-05 MED ORDER — MEPERIDINE HCL 25 MG/ML IJ SOLN: 6.2500 mg | INTRAMUSCULAR | Status: DC | PRN

## 2016-12-05 MED ORDER — PROPOFOL 10 MG/ML IV BOLUS
INTRAVENOUS | Status: AC
  Filled 2016-12-05: qty 20

## 2016-12-05 MED ORDER — ONDANSETRON HCL 4 MG/2ML IJ SOLN
INTRAMUSCULAR | Status: DC | PRN
  Administered 2016-12-05: 4 mg via INTRAVENOUS

## 2016-12-05 MED ORDER — B COMPLEX-C PO TABS
1.0000 | ORAL_TABLET | Freq: Every day | ORAL | Status: DC
  Administered 2016-12-05: 1 via ORAL
  Filled 2016-12-05 (×3): qty 1

## 2016-12-05 MED ORDER — MIDAZOLAM HCL 2 MG/2ML IJ SOLN
INTRAMUSCULAR | Status: AC
  Filled 2016-12-05: qty 2

## 2016-12-05 MED ORDER — FENTANYL CITRATE (PF) 100 MCG/2ML IJ SOLN
25.0000 ug | INTRAMUSCULAR | Status: DC | PRN
  Administered 2016-12-05 (×3): 50 ug via INTRAVENOUS

## 2016-12-05 MED ORDER — INSULIN DETEMIR 100 UNIT/ML ~~LOC~~ SOLN
10.0000 [IU] | Freq: Every day | SUBCUTANEOUS | Status: DC
  Administered 2016-12-05: 10 [IU] via SUBCUTANEOUS
  Filled 2016-12-05: qty 0.1

## 2016-12-05 MED ORDER — DOCUSATE SODIUM 100 MG PO CAPS
100.0000 mg | ORAL_CAPSULE | Freq: Two times a day (BID) | ORAL | Status: DC
  Administered 2016-12-05 – 2016-12-06 (×3): 100 mg via ORAL
  Filled 2016-12-05 (×3): qty 1

## 2016-12-05 MED ORDER — SUCCINYLCHOLINE CHLORIDE 20 MG/ML IJ SOLN
INTRAMUSCULAR | Status: DC | PRN
  Administered 2016-12-05: 120 mg via INTRAVENOUS

## 2016-12-05 MED ORDER — ACETAMINOPHEN 325 MG PO TABS
650.0000 mg | ORAL_TABLET | ORAL | Status: DC | PRN
  Administered 2016-12-05: 650 mg via ORAL
  Filled 2016-12-05: qty 2

## 2016-12-05 MED ORDER — LEVOTHYROXINE SODIUM 150 MCG PO TABS: ORAL_TABLET | ORAL | 3 refills | Status: DC

## 2016-12-05 MED ORDER — VENLAFAXINE HCL ER 150 MG PO CP24
150.0000 mg | ORAL_CAPSULE | Freq: Two times a day (BID) | ORAL | Status: DC
  Administered 2016-12-05 – 2016-12-06 (×3): 150 mg via ORAL
  Filled 2016-12-05 (×2): qty 1
  Filled 2016-12-05 (×2): qty 2
  Filled 2016-12-05: qty 1

## 2016-12-05 MED ORDER — MUPIROCIN 2 % EX OINT
TOPICAL_OINTMENT | CUTANEOUS | Status: AC
  Filled 2016-12-05: qty 22

## 2016-12-05 MED ORDER — ROCURONIUM BROMIDE 100 MG/10ML IV SOLN
INTRAVENOUS | Status: DC | PRN
  Administered 2016-12-05: 50 mg via INTRAVENOUS

## 2016-12-05 MED ORDER — LACTATED RINGERS IV SOLN: INTRAVENOUS | Status: DC

## 2016-12-05 MED ORDER — HYDROCHLOROTHIAZIDE 12.5 MG PO CAPS
12.5000 mg | ORAL_CAPSULE | Freq: Every day | ORAL | Status: DC
  Filled 2016-12-05: qty 1

## 2016-12-05 MED ORDER — PANTOPRAZOLE SODIUM 40 MG PO TBEC
40.0000 mg | DELAYED_RELEASE_TABLET | Freq: Every day | ORAL | Status: DC
  Administered 2016-12-06: 40 mg via ORAL
  Filled 2016-12-05: qty 1

## 2016-12-05 MED ORDER — BUPIVACAINE HCL (PF) 0.25 % IJ SOLN
INTRAMUSCULAR | Status: DC | PRN
  Administered 2016-12-05: 1 mL
  Administered 2016-12-05: 10 mL

## 2016-12-05 SURGICAL SUPPLY — 50 items
ADH SKN CLS APL DERMABOND .7 (GAUZE/BANDAGES/DRESSINGS) ×1
APL SKNCLS STERI-STRIP NONHPOA (GAUZE/BANDAGES/DRESSINGS) ×1
BENZOIN TINCTURE PRP APPL 2/3 (GAUZE/BANDAGES/DRESSINGS) ×2 IMPLANT
BUR ROUND FLUTED 4 SOFT TCH (BURR) IMPLANT
BUR ROUND FLUTED 4MM SOFT TCH (BURR)
CANISTER SUCT 3000ML PPV (MISCELLANEOUS) ×3 IMPLANT
CHLORAPREP W/TINT 26ML (MISCELLANEOUS) ×2 IMPLANT
CLOSURE STERI-STRIP 1/2X4 (GAUZE/BANDAGES/DRESSINGS) ×1
CLSR STERI-STRIP ANTIMIC 1/2X4 (GAUZE/BANDAGES/DRESSINGS) ×2 IMPLANT
COVER SURGICAL LIGHT HANDLE (MISCELLANEOUS) ×3 IMPLANT
DECANTER SPIKE VIAL GLASS SM (MISCELLANEOUS) ×5 IMPLANT
DERMABOND ADVANCED (GAUZE/BANDAGES/DRESSINGS) ×2
DERMABOND ADVANCED .7 DNX12 (GAUZE/BANDAGES/DRESSINGS) ×1 IMPLANT
DRAPE HALF SHEET 40X57 (DRAPES) ×6 IMPLANT
DRAPE MICROSCOPE LEICA (MISCELLANEOUS) ×3 IMPLANT
DRAPE SURG 17X23 STRL (DRAPES) ×5 IMPLANT
DRSG MEPILEX BORDER 4X4 (GAUZE/BANDAGES/DRESSINGS) ×3 IMPLANT
DURAPREP 26ML APPLICATOR (WOUND CARE) ×1 IMPLANT
ELECT REM PT RETURN 9FT ADLT (ELECTROSURGICAL) ×3
ELECTRODE REM PT RTRN 9FT ADLT (ELECTROSURGICAL) ×1 IMPLANT
GLOVE BIOGEL PI IND STRL 8 (GLOVE) ×2 IMPLANT
GLOVE BIOGEL PI INDICATOR 8 (GLOVE) ×4
GLOVE ORTHO TXT STRL SZ7.5 (GLOVE) ×6 IMPLANT
GOWN STRL REUS W/ TWL LRG LVL3 (GOWN DISPOSABLE) ×2 IMPLANT
GOWN STRL REUS W/ TWL XL LVL3 (GOWN DISPOSABLE) ×1 IMPLANT
GOWN STRL REUS W/TWL 2XL LVL3 (GOWN DISPOSABLE) ×3 IMPLANT
GOWN STRL REUS W/TWL LRG LVL3 (GOWN DISPOSABLE) ×6
GOWN STRL REUS W/TWL XL LVL3 (GOWN DISPOSABLE) ×3
KIT BASIN OR (CUSTOM PROCEDURE TRAY) ×3 IMPLANT
KIT ROOM TURNOVER OR (KITS) ×3 IMPLANT
MANIFOLD NEPTUNE II (INSTRUMENTS) ×3 IMPLANT
NDL HYPO 25GX1X1/2 BEV (NEEDLE) ×1 IMPLANT
NDL SPNL 18GX3.5 QUINCKE PK (NEEDLE) ×1 IMPLANT
NEEDLE HYPO 25GX1X1/2 BEV (NEEDLE) ×3 IMPLANT
NEEDLE SPNL 18GX3.5 QUINCKE PK (NEEDLE) ×3 IMPLANT
NS IRRIG 1000ML POUR BTL (IV SOLUTION) ×3 IMPLANT
PACK LAMINECTOMY ORTHO (CUSTOM PROCEDURE TRAY) ×3 IMPLANT
PAD ARMBOARD 7.5X6 YLW CONV (MISCELLANEOUS) ×6 IMPLANT
PATTIES SURGICAL .5 X.5 (GAUZE/BANDAGES/DRESSINGS) IMPLANT
PATTIES SURGICAL .75X.75 (GAUZE/BANDAGES/DRESSINGS) IMPLANT
SUT VIC AB 0 CT1 27 (SUTURE)
SUT VIC AB 0 CT1 27XBRD ANBCTR (SUTURE) IMPLANT
SUT VIC AB 1 CT1 27 (SUTURE) ×3
SUT VIC AB 1 CT1 27XBRD ANBCTR (SUTURE) ×1 IMPLANT
SUT VIC AB 1 CTX 27 (SUTURE) ×2 IMPLANT
SUT VIC AB 2-0 CT1 27 (SUTURE) ×3
SUT VIC AB 2-0 CT1 TAPERPNT 27 (SUTURE) ×1 IMPLANT
SUT VIC AB 3-0 X1 27 (SUTURE) ×3 IMPLANT
TOWEL OR 17X24 6PK STRL BLUE (TOWEL DISPOSABLE) ×3 IMPLANT
TOWEL OR 17X26 10 PK STRL BLUE (TOWEL DISPOSABLE) ×3 IMPLANT

## 2016-12-05 NOTE — Anesthesia Postprocedure Evaluation (Signed)
Anesthesia Post Note  Patient: [name]  Procedure(s) Performed: LEFT L5-S1 MICRODISCECTOMY (N/A Back)     Patient location during evaluation: PACU Anesthesia Type: General Level of consciousness: awake and alert Pain management: pain level controlled Vital Signs Assessment: post-procedure vital signs reviewed and stable Respiratory status: spontaneous breathing, nonlabored ventilation, respiratory function stable and patient connected to nasal cannula oxygen Cardiovascular status: blood pressure returned to baseline and stable Postop Assessment: no apparent nausea or vomiting Anesthetic complications: no    Last Vitals:  Vitals:   12/05/16 0819 12/05/16 1230  BP: (!) 143/93   Pulse: (!) 102   Resp: 18   Temp: 36.7 C 36.7 C  SpO2: 97%     Last Pain:  Vitals:   12/05/16 1240  TempSrc:   PainSc: 9                  Montez Hageman

## 2016-12-05 NOTE — Transfer of Care (Signed)
Immediate Anesthesia Transfer of Care Note  Patient: [name]  Procedure(s) Performed: LEFT L5-S1 MICRODISCECTOMY (N/A Back)  Patient Location: PACU  Anesthesia Type:General  Level of Consciousness: awake and patient cooperative  Airway & Oxygen Therapy: Patient Spontanous Breathing  Post-op Assessment: Report given to RN and Post -op Vital signs reviewed and stable  Post vital signs: Reviewed and stable  Last Vitals:  Vitals:   12/05/16 0819 12/05/16 1230  BP: (!) 143/93   Pulse: (!) 102   Resp: 18   Temp: 36.7 C 36.7 C  SpO2: 97%     Last Pain:  Vitals:   12/05/16 1230  TempSrc:   PainSc: Asleep      Patients Stated Pain Goal: 7 (43/56/86 1683)  Complications: No apparent anesthesia complications

## 2016-12-05 NOTE — Op Note (Signed)
Preop diagnosis left central HNP L5-S1  Postop diagnosis: Same  Procedure: Left L5-S1 microdiscectomy. Partial  Hemi- laminectomy left L5 and S1.  Surgeon: Rodell Perna M.D.  Assistant: Benjiman Core PA-C  Anesthesia: Gen. plus Marcaine local  Palpitations none  Findings moderately large central disc bulge with caudad migration of fragment retained by ligamentum.  Procedure after induction general anesthesia patient placed prone extra tape and seatbelts are applied since patient's BMI was 53 and there is overhanging on each side of the table. Careful securement kneepads Bumpers the anterior tibia pads and chest rolls were used. Arms were at 9090 pads were applied over the ulnar nerve rolled yellow shoulder rolls anterior shoulder. Back was prepped with DuraPrep there squared with towels sterile skin [name] the midline and the Betadine Steri-Drape laminectomy sheet and spinal needle. Initial spinal needle placement was a little bit low at the junction of S1-S2 and after timeout procedure incision was made starting at the midline extending proximally so process of dissection down onto the lamina just above where the needle had been initially positioned and then the second x-ray was taken. This was with small laminotomy and Penfield 4 placed on the lateral gutter was just 2-3 mm below the disc space where the fragment had some downturn behind the S1 vertebral body retained by ligament. Overhanging facets were burred back with partial facetectomy. Bone was removed at the level of the S1 pedicle nerve root was free but had dorsal displacement due to the disc bulge. Annulus was incised passes were made with long micropituitary straight pituitaries up and down pituitary Epstein curettes, hockey-stick the until the disc was decompressed and passes anterior to the dura showed no areas remaining compression nerve root was free the central and left side disc protrusion which was causing lateral recess stenosis had  been decompressed. Dura was intact irrigation saline solution epidural veins been coagulated with the long bipolar cautery. Fascia was closed with #1 Vicryl 2-0 Vicryl subtendinous tissue skin closure postop dressing and transferred recovery room.

## 2016-12-05 NOTE — Discharge Instructions (Signed)
ORTHOPEDIC DISCHARGE INSTRUCTIONS  -OKAY TO SHOWER 5 DAYS POSTOP.  NO TUB SOAKING. DO NOT APPLY ANY CREAMS OR OINTMENTS TO INCISION. DAILY DRESSING CHANGES WITH 4 X 4 GAUZE AND TAPE.  -NO LIFTING, BENDING, TWISTING, PUSHING, PULLING.  -NO DRIVING UNTIL FURTHER NOTICE.  -IF YOU BEGIN TO HAVE ANY INCREASE IN YOUR PAIN OR HAVE ANY FEVER/CHILLS OR DRAINAGE FROM YOUR INCISION YOU SHOULD CONTACT OUR OFFICE IMMEDIATELY.

## 2016-12-05 NOTE — Progress Notes (Signed)
Physical Therapy Evaluation Patient Details Name: [name] MRN: [id number] DOB: 09-11-1955 Today's Date: 12/05/2016   History of Present Illness  Pt is a 61 y.o. female who is s/p left L5-S1 microdiscectomy.   Clinical Impression  Evaluated patient and educated her on surgical precautions, bed mobility, transfers, and car transfers. Patient is requiring increased assistance with mobility and reports severe pain limiting mobility progression. Patient is not safe to d/c home alone with limited caregiver assistance, however she is refusing the SNF recommendation. HHPT with 24 hour supervision is recommended upon acute d/c. PT will continue to follow acutely and progress as tolerated.     Follow Up Recommendations Home health PT;Supervision/Assistance - 24 hour (recommending SNF patient refusing)    Equipment Recommendations  None recommended by PT    Recommendations for Other Services       Precautions / Restrictions Precautions Precautions: Fall Restrictions Weight Bearing Restrictions: No      Mobility  Bed Mobility Overal bed mobility: Needs Assistance Bed Mobility: Sidelying to Sit;Sit to Sidelying   Sidelying to sit: Min assist     Sit to sidelying: Min assist General bed mobility comments: Min assist to manage LEs to and from bed. Pt slowly moving but able to elevate trunk   Transfers Overall transfer level: Needs assistance Equipment used: Rolling walker (2 wheeled) Transfers: Sit to/from Stand Sit to Stand: Min guard         General transfer comment: pt moving slowly and needed VCs for proper UE placement to RW. Min guard for safety and to steady when in standing  Ambulation/Gait Ambulation/Gait assistance: Min guard;Min assist Ambulation Distance (Feet): 80 Feet Assistive device: Rolling walker (2 wheeled) Gait Pattern/deviations: Step-to pattern;Step-through pattern;Decreased step length - right;Decreased step length - left;Antalgic;Decreased  dorsiflexion - right;Decreased dorsiflexion - left;Decreased stride length;Trunk flexed;Narrow base of support;Drifts right/left Gait velocity: decreased Gait velocity interpretation: <1.8 ft/sec, indicative of risk for recurrent falls General Gait Details: very slow and instability noted. Min guard throughout with occasional min assist for LOB but patient able to stop and self-correct. Flexed posture and patient complaint of severe pain throughout session.  Stairs            Wheelchair Mobility    Modified Rankin (Stroke Patients Only)       Balance Overall balance assessment: Needs assistance Sitting-balance support: Feet supported;No upper extremity supported Sitting balance-Leahy Scale: Fair     Standing balance support: During functional activity;Bilateral upper extremity supported Standing balance-Leahy Scale: Poor Standing balance comment: reliant on RW for support                             Pertinent Vitals/Pain Pain Assessment: Faces Pain Score: 10-Worst pain ever Faces Pain Scale: Hurts whole lot Pain Location: back Pain Descriptors / Indicators: Sharp;Discomfort Pain Intervention(s): Patient requesting pain meds-RN notified    Home Living Family/patient expects to be discharged to:: Private residence Living Arrangements: Other relatives ([place]) Available Help at Discharge: Family;Available PRN/intermittently;Personal care attendant (aide 2-3 hours per day) Type of Home: House Home Access: Stairs to enter Entrance Stairs-Rails: None Entrance Stairs-Number of Steps: 1 Home Layout: One level Home Equipment: [place] - single point;Shower seat;Walker - 4 wheels;Bedside commode      Prior Function Level of Independence: Needs assistance   Gait / Transfers Assistance Needed: pt reported that she ambulates with use of rollator  ADL's / Homemaking Assistance Needed: pt has personal aide who  assists with bathing, dressing,meal prepping  and housekeeping        Hand Dominance   Dominant Hand: Right    Extremity/Trunk Assessment   Upper Extremity Assessment Upper Extremity Assessment: Defer to OT evaluation    Lower Extremity Assessment Lower Extremity Assessment: RLE deficits/detail;LLE deficits/detail RLE Deficits / Details: sensation intact to LT RLE: Unable to fully assess due to pain LLE Deficits / Details: sensation intact to LT LLE: Unable to fully assess due to pain       Communication   Communication: No difficulties  Cognition Arousal/Alertness: Awake/alert Behavior During Therapy:  (distracted by pain) Overall Cognitive Status: Impaired/Different from baseline Area of Impairment: Safety/judgement;Awareness;Problem solving;Following commands                       Following Commands: Follows one step commands with increased time;Follows one step commands consistently Safety/Judgement: Decreased awareness of safety;Decreased awareness of deficits Awareness: Intellectual Problem Solving: Slow processing;Decreased initiation;Requires verbal cues General Comments: pt is eager to go home and not aware of her deficits or safety. Hyperfocused on pain throughout session as well.      General Comments      Exercises     Assessment/Plan    PT Assessment Patient needs continued PT services  PT Problem List Decreased strength;Decreased range of motion;Decreased activity tolerance;Decreased balance;Decreased mobility;Decreased coordination;Decreased cognition;Decreased knowledge of use of DME;Decreased safety awareness;Decreased knowledge of precautions;Pain;Obesity       PT Treatment Interventions DME instruction;Gait training;Stair training;Functional mobility training;Therapeutic activities;Therapeutic exercise;Balance training;Neuromuscular re-education;Patient/family education    PT Goals (Current goals can be found in the Care Plan section)  Acute Rehab PT Goals Patient Stated Goal:  to go home PT Goal Formulation: With patient Time For Goal Achievement: 12/19/16 Potential to Achieve Goals: Fair    Frequency Min 5X/week   Barriers to discharge        Co-evaluation               AM-PAC PT "6 Clicks" Daily Activity  Outcome Measure Difficulty turning over in bed (including adjusting bedclothes, sheets and blankets)?: Unable Difficulty moving from lying on back to sitting on the side of the bed? : Unable Difficulty sitting down on and standing up from a chair with arms (e.g., wheelchair, bedside commode, etc,.)?: Unable Help needed moving to and from a bed to chair (including a wheelchair)?: A Lot Help needed walking in hospital room?: A Lot Help needed climbing 3-5 steps with a railing? : Total 6 Click Score: 8    End of Session Equipment Utilized During Treatment: Gait belt Activity Tolerance: Patient limited by pain Patient left: in bed;with call bell/phone within reach Nurse Communication: Mobility status;Patient requests pain meds PT Visit Diagnosis: Unsteadiness on feet (R26.81);Muscle weakness (generalized) (M62.81);Other abnormalities of gait and mobility (R26.89)    Time: 5102-5852 PT Time Calculation (min) (ACUTE ONLY): 23 min   Charges:   PT Evaluation $PT Eval Moderate Complexity: 1 Mod     PT G CodesSindy Guadeloupe, SPT [phone] office  Margarita Grizzle 12/05/2016, 4:39 PM

## 2016-12-05 NOTE — Anesthesia Procedure Notes (Signed)
Procedure Name: Intubation Date/Time: 12/05/2016 10:45 AM Performed by: Lance Coon Pre-anesthesia Checklist: Patient identified, Emergency Drugs available, Suction available, Patient being monitored and Timeout performed Patient Re-evaluated:Patient Re-evaluated prior to induction Oxygen Delivery Method: Circle system utilized Preoxygenation: Pre-oxygenation with 100% oxygen Induction Type: IV induction Ventilation: Mask ventilation without difficulty Laryngoscope Size: Miller and 3 Grade View: Grade II Tube type: Oral Tube size: 7.5 mm Number of attempts: 1 Airway Equipment and Method: Stylet Placement Confirmation: ETT inserted through vocal cords under direct vision,  positive ETCO2 and breath sounds checked- equal and bilateral Secured at: 21 cm Tube secured with: Tape Dental Injury: Teeth and Oropharynx as per pre-operative assessment

## 2016-12-05 NOTE — Anesthesia Preprocedure Evaluation (Signed)
Anesthesia Evaluation  Patient identified by MRN, date of birth, ID band Patient awake    Reviewed: Allergy & Precautions, NPO status , Patient's Chart, lab work & pertinent test results  Airway Mallampati: III  TM Distance: >3 FB Neck ROM: Full    Dental no notable dental hx.    Pulmonary asthma , sleep apnea and Continuous Positive Airway Pressure Ventilation , former smoker,    Pulmonary exam normal breath sounds clear to auscultation       Cardiovascular hypertension, Pt. on medications Normal cardiovascular exam Rhythm:Regular Rate:Normal     Neuro/Psych Anxiety Depression Schizophrenia negative neurological ROS     GI/Hepatic Neg liver ROS, hiatal hernia, GERD  ,  Endo/Other  diabetes, Type 2, Insulin Dependent, Oral Hypoglycemic AgentsHypothyroidism Morbid obesity  Renal/GU negative Renal ROS  negative genitourinary   Musculoskeletal negative musculoskeletal ROS (+)   Abdominal   Peds negative pediatric ROS (+)  Hematology negative hematology ROS (+)   Anesthesia Other Findings   Reproductive/Obstetrics negative OB ROS                             Anesthesia Physical Anesthesia Plan  ASA: III  Anesthesia Plan: General   Post-op Pain Management:    Induction: Intravenous  PONV Risk Score and Plan: 3 and Ondansetron, Dexamethasone, Midazolam and Propofol infusion  Airway Management Planned: Oral ETT  Additional Equipment:   Intra-op Plan:   Post-operative Plan: Extubation in OR  Informed Consent: I have reviewed the patients History and Physical, chart, labs and discussed the procedure including the risks, benefits and alternatives for the proposed anesthesia with the patient or authorized representative who has indicated his/her understanding and acceptance.   Dental advisory given  Plan Discussed with: CRNA  Anesthesia Plan Comments:         Anesthesia Quick  Evaluation

## 2016-12-05 NOTE — H&P (Signed)
[name] is an 60 y.o. female.   Chief Complaint: back pain and left LE radiculopathy.  HPI: patient with hx of L5-S1 HNP and above complaint presents for surgical intervention.  Progressively worsening symptoms. Failed conservative treatment.    Past Medical History:  Diagnosis Date  . Anemia   . Anxiety   . Arthritis    "pretty much all my joints"  . Asthma   . Benign paroxysmal positional vertigo 12/30/2012  . Breast cancer ([place]) 02/13/12   ruq  100'clock bx Ductal Carcinoma in Situ,(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  . Breast mass in female    L breast 2008, US showed likely fat necrosis  . Chronic lower back pain   . CKD (chronic kidney disease), stage III ([employer])    "lower stage" (01/06/2014)  . Daily headache    last time was September 15, 2016 (brother passed away)  . Depression   . Dyspnea   . Gait abnormality 05/29/2016  . GERD (gastroesophageal reflux disease)   . History of blood transfusion 2011   "after one of my OR's"  . History of hiatal hernia   . History of stomach ulcers   . HSV (herpes simplex virus) infection   . Hx of radiation therapy 05/05/12- 07/15/12   right breast, 45 gray x 25 fx, lumpectomy cavity boosted to 16.2 gray  . Hyperlipemia   . Hypertension    sees Dr. Criss Rosales , [facility]  . Hypothyroidism   . Kidney stones   . Knee pain, bilateral   . Migraines    "~ 3 times/month" (01/06/2014)  . Obesity   . OSA on CPAP    pt does not know settings  . Pancreatitis    hx of  . Personal history of radiation therapy 02/2012  . Pneumonia 1950's  . Polyneuropathy in diabetes(357.2)   . Schizophrenia ([place])   . 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 Tachycardia    with sx in 2008  . Thyroid disease   . Type II diabetes mellitus ([place])    dx 2014    Past Surgical History:  Procedure Laterality Date  . ABDOMINAL HYSTERECTOMY  1979?   partial  . benign cyst  Left    removed from left breast  . BREAST LUMPECTOMY  03/03/2012   Procedure: LUMPECTOMY;  Surgeon:  Haywood Lasso, MD;  Location: [facility];  Service: General;  Laterality: Right;  . BREAST SURGERY Right 01/2012   "cancer"  . CHOLECYSTECTOMY  1980's?  . COLONOSCOPY WITH PROPOFOL N/A 09/28/2012   Procedure: COLONOSCOPY WITH PROPOFOL;  Surgeon: Lear Ng, MD;  Location: [facility];  Service: Endoscopy;  Laterality: N/A;  . FOOT FRACTURE SURGERY Right 1990's  . JOINT REPLACEMENT      x 3  . REVISION TOTAL KNEE ARTHROPLASTY  07/2009  . SHOULDER OPEN ROTATOR CUFF REPAIR Left 1990's  . THYROIDECTOMY  1970's  . TOTAL KNEE ARTHROPLASTY Bilateral 2009-06/2009   left; right    Family History  Problem Relation Age of Onset  . Heart disease Brother        Multiple MIs, starting in his 20s  . Diabetes Brother   . Thyroid disease Brother   . Hypertension Mother   . Diabetes Mother   . Breast cancer Mother 38  . Bone cancer Mother   . Hypertension Sister   . Diabetes Sister   . Breast cancer Sister 56  . Thyroid disease Sister   . Breast cancer Maternal Grandmother   . Heart disease  Maternal Grandmother   . Uterine cancer Other 19  . Breast cancer Paternal Aunt 94  . Breast cancer Paternal Grandmother        dx in her 37s  . Prostate cancer Paternal Grandfather   . Asthma Son    Social History:  reports that she quit smoking about 24 years ago. Her smoking use included Cigarettes. She has a 7.00 pack-year smoking history. She has never used smokeless tobacco. She reports that she does not drink alcohol or use drugs.  Allergies:  Allergies  Allergen Reactions  . Bee Venom Anaphylaxis  . Shellfish Allergy Anaphylaxis  . Buprenorphine Hcl Other (See Comments)    Overly sedated  . Other Other (See Comments)    Cats - bad asthma, stopped up    Medications Prior to Admission  Medication Sig Dispense Refill  . albuterol (PROAIR HFA) 108 (90 Base) MCG/ACT inhaler Inhale 2 puffs into the lungs every 4 (four) hours as needed for wheezing or shortness of breath.     [name] aspirin  EC 81 MG tablet Take 1 tablet (81 mg total) by mouth daily. 30 tablet 0  . B Complex-C (B-COMPLEX WITH VITAMIN C) tablet Take 1 tablet by mouth daily.    . budesonide-formoterol (SYMBICORT) 80-4.5 MCG/ACT inhaler Inhale 2 puffs into the lungs 2 (two) times daily. (Patient taking differently: Inhale 1 puff into the lungs 3 (three) times daily. ) 1 Inhaler 11  . calcitRIOL (ROCALTROL) 0.25 MCG capsule Take 1 capsule (0.25 mcg total) by mouth 3 (three) times a week. (Patient taking differently: Take 0.25 mcg by mouth every Monday, Wednesday, and Friday. ) 13 capsule 5  . Cholecalciferol (VITAMIN D-3) 1000 units CAPS Take 1,000 Units by mouth daily.    . Cyanocobalamin (VITAMIN B-12 PO) Take 1 tablet by mouth daily.    . diclofenac (VOLTAREN) 75 MG EC tablet Take 1 tablet (75 mg total) by mouth 2 (two) times daily. 60 tablet 0  . diphenhydrAMINE (BENADRYL) 25 MG tablet Take 50 mg by mouth every 6 (six) hours as needed for itching or allergies.    [name] doxepin (SINEQUAN) 25 MG capsule Take 50 mg by mouth at bedtime.    [name] esomeprazole (NEXIUM) 40 MG capsule Take 1 capsule (40 mg total) by mouth daily before breakfast. (Patient taking differently: Take 40 mg by mouth 2 (two) times daily before a meal. ) 30 capsule 1  . furosemide (LASIX) 40 MG tablet Take 1 tablet (40 mg total) by mouth daily. 30 tablet 0  . ibuprofen (ADVIL,MOTRIN) 200 MG tablet Take 400 mg by mouth every 6 (six) hours as needed for headache or moderate pain.    . Insulin Detemir (LEVEMIR FLEXTOUCH) 100 UNIT/ML Pen Inject 10 Units into the skin at bedtime.     . insulin lispro (HUMALOG KWIKPEN) 100 UNIT/ML KiwkPen Inject 10 Units into the skin 3 (three) times daily.    . Iron-Vitamins (GERITOL PO) Take 1 tablet by mouth daily.    [name] JANUMET 50-500 MG tablet Take 1 tablet by mouth 2 (two) times daily with a meal.     . levothyroxine (SYNTHROID, LEVOTHROID) 150 MCG tablet Take 1 tablet (150 mcg total) by mouth daily before breakfast. 30 tablet 2   . linaclotide (LINZESS) 290 MCG CAPS capsule Take 290 mcg by mouth daily before breakfast.    . LORazepam (ATIVAN) 2 MG tablet Take 1-2 mg by mouth See admin instructions. Take 1 mg by mouth in the morning and take 2 mg  by mouth at bedtime    . losartan-hydrochlorothiazide (HYZAAR) 100-12.5 MG tablet Take 1 tablet by mouth daily.    [name] LYRICA 150 MG capsule Take 150 mg by mouth 2 (two) times daily.   1  . Multiple Vitamin (MULTIVITAMIN WITH MINERALS) TABS tablet Take 1 tablet by mouth daily.    . Oxycodone HCl 10 MG TABS Take 10 mg by mouth 4 (four) times daily.  0  . POTASSIUM PO Take 1 tablet by mouth daily.    . rosuvastatin (CRESTOR) 20 MG tablet Take 20 mg by mouth daily.    [name] tiZANidine (ZANAFLEX) 4 MG tablet Take 4 mg by mouth 2 (two) times daily.     . valACYclovir (VALTREX) 1000 MG tablet Take 1,000 mg by mouth daily.    [name] venlafaxine XR (EFFEXOR-XR) 150 MG 24 hr capsule Take 150 mg by mouth 2 (two) times daily.    . ziprasidone (GEODON) 20 MG capsule Take 1 capsule (20 mg total) by mouth 2 (two) times daily with a meal. (Patient taking differently: Take 40 mg by mouth daily. )    . insulin aspart (NOVOLOG) 100 UNIT/ML injection Inject 0-9 Units into the skin 3 (three) times daily with meals. 0-9 Units, Subcutaneous CBG < 70: implement hypoglycemia protocol CBG 70 - 120: 0 units CBG 121 - 150: 1 unit CBG 151 - 200: 2 units CBG 201 - 250: 3 units CBG 251 - 300: 5 units CBG 301 - 350: 7 units CBG 351 - 400: 9 units CBG > 400: call MD (Patient not taking: Reported on 12/03/2016) 10 mL 11    Results for orders placed or performed during the hospital encounter of 12/05/16 (from the past 48 hour(s))  Glucose, capillary     Status: Abnormal   Collection Time: 12/05/16  8:17 AM  Result Value Ref Range   Glucose-Capillary 101 (H) 65 - 99 mg/dL   No results found.  Review of Systems  Constitutional: Negative.   HENT: Negative.   Respiratory: Negative.   Cardiovascular: Negative.    Gastrointestinal: Negative.   Genitourinary: Negative.   Musculoskeletal: Positive for back pain.  Skin: Negative.     Blood pressure (!) 143/93, pulse (!) 102, temperature 98.1 F (36.7 C), temperature source Oral, resp. rate 18, height _0  (1.651 m), weight (!) 320 lb (145.2 kg), SpO2 97 %. Physical Exam  Constitutional: She is oriented to person, place, and time. She appears well-developed and well-nourished.  HENT:  Head: Normocephalic and atraumatic.  Eyes: Pupils are equal, round, and reactive to light. EOM are normal.  Neck: Normal range of motion.  Respiratory: No respiratory distress.  GI: She exhibits no distension.  Musculoskeletal:  Patient sciatic notch tenderness. On the left palm on the right. Mild trochanteric bursal tenderness no pain with hip range of motion internal and external rotation. Well-healed total knee arthroplasties. Healed rotator cuff scars from previous repair. There is a positive straight leg raising on the left at 70. Pain with straight leg raising on the right at 80. Positive popliteal compression test. She has peroneal weakness on the left as well as some gastrocsoleus weakness. She is not able to heel or toe walk. Decreased sensation left lateral foot plantar surface of her foot. An anterior tib on the right side shows trace weakness. She ambulates with a rolling walker with the hips in a flexed position and slight knee flexion. She cannot get up from a chair without use of her arms.   Neurological: She  is alert and oriented to person, place, and time.  Skin: Skin is warm and dry.  Psychiatric: She has a normal mood and affect.     Assessment/Plan  Left L5-S1 HNP, back pain and left LE radiculopathy.   We will proceed with LEFT L5-S1 MICRODISCECTOMY  As scheduled.  Surgical procedure along with possible risks and complications discussed.  All questions answered and wishes to proceed.   Benjiman Core, PA-C 12/05/2016, 10:05 AM

## 2016-12-05 NOTE — Interval H&P Note (Signed)
History and Physical Interval Note:  12/05/2016 10:14 AM  [name]  has presented today for surgery, with the diagnosis of Left L5-S1 Herniated Nucleus Pulposus  The various methods of treatment have been discussed with the patient and family. After consideration of risks, benefits and other options for treatment, the patient has consented to  Procedure(s): LEFT L5-S1 MICRODISCECTOMY (N/A) as a surgical intervention .  The patient's history has been reviewed, patient examined, no change in status, stable for surgery.  I have reviewed the patient's chart and labs.  Questions were answered to the patient's satisfaction.     [name]

## 2016-12-06 ENCOUNTER — Encounter (HOSPITAL_COMMUNITY): Payer: Self-pay | Admitting: Orthopaedic Surgery

## 2016-12-06 DIAGNOSIS — M5117 Intervertebral disc disorders with radiculopathy, lumbosacral region: Secondary | ICD-10-CM | POA: Diagnosis not present

## 2016-12-06 LAB — GLUCOSE, CAPILLARY: Glucose-Capillary: 98 mg/dL (ref 65–99)

## 2016-12-06 NOTE — Evaluation (Signed)
Occupational Therapy Evaluation Patient Details Name: [name] MRN: [id number] DOB: [birth year] Today's Date: 12/06/2016    History of Present Illness Pt is a 61 y.o. female who is s/p left L5-S1 microdiscectomy.    Clinical Impression   PTA, pt was living with her nephew and had an aide who would assist with bathing, dressing, and IADLs. Currently, pt requires Min guard for UB ADLs and functional mobility using RW. Pt requiring Min A for donning pants with AE and Max A for socks and shoes. Provided education on LB ADLs with AE, toilet transfer, and shower transfer with seat; pt verbalized and demonstrated understanding. Answered all pt questions. Recommend dc home once medically stable per physician. All acute OT needs met and will sign off. Thank you.     Follow Up Recommendations  No OT follow up;Supervision/Assistance - 24 hour    Equipment Recommendations  None recommended by OT    Recommendations for Other Services PT consult     Precautions / Restrictions Precautions Precautions: Fall;Back Precaution Comments: Reviewed back precautions. Pt able to recall 2/3 and Min VCs to recall no twisting.  Required Braces or Orthoses:  (No brace per MD order) Restrictions Weight Bearing Restrictions: No      Mobility Bed Mobility Overal bed mobility: Needs Assistance Bed Mobility: Sidelying to Sit;Sit to Sidelying   Sidelying to sit: Min assist     Sit to sidelying: Min guard General bed mobility comments: Min A for rolling and to prevent twisting. Min Guard for safety while pt pushed into sitting  Transfers Overall transfer level: Needs assistance Equipment used: Rolling walker (2 wheeled) Transfers: Sit to/from Stand Sit to Stand: Min guard         General transfer comment: Min Guard for safety    Balance Overall balance assessment: Needs assistance Sitting-balance support: Feet supported;No upper extremity supported Sitting balance-Leahy Scale: Fair      Standing balance support: During functional activity;Bilateral upper extremity supported Standing balance-Leahy Scale: Poor Standing balance comment: reliant on RW for support                           ADL either performed or assessed with clinical judgement   ADL Overall ADL's : Needs assistance/impaired Eating/Feeding: Set up;Supervision/ safety;Sitting   Grooming: Min guard;Standing   Upper Body Bathing: Moderate assistance;Sitting   Lower Body Bathing: Moderate assistance;Sit to/from stand   Upper Body Dressing : Set up;Supervision/safety;Sitting;Cueing for safety   Lower Body Dressing: Minimal assistance;Sit to/from stand;With adaptive equipment;Cueing for sequencing;Cueing for safety;Maximal assistance Lower Body Dressing Details (indicate cue type and reason): Min A required while pt donned underwear and pants with AE.  Max A for socks and shoes. Toilet Transfer: Min guard;Ambulation (Simulated in room)           Functional mobility during ADLs: Min guard;Rolling walker General ADL Comments: Provided education on back precaution and techniques to adhere to precautions during ADLs. Educated pt on AE for LB dressing; pt demosntrating understanding. Pt requiring Min VCs to recall no twisting for back precautions     Vision         Perception     Praxis      Pertinent Vitals/Pain Pain Assessment: 0-10 Pain Score: 7  Pain Location: back Pain Descriptors / Indicators: Sharp;Discomfort Pain Intervention(s): Monitored during session;Limited activity within patient's tolerance;Repositioned     Hand Dominance Right   Extremity/Trunk Assessment Upper Extremity Assessment Upper Extremity Assessment: Overall WFL for  tasks assessed   Lower Extremity Assessment Lower Extremity Assessment: Defer to PT evaluation   Cervical / Trunk Assessment Cervical / Trunk Assessment: Other exceptions Cervical / Trunk Exceptions: s/p L5-S1 microdiscectomy and increased  body habitus.    Communication Communication Communication: No difficulties   Cognition Arousal/Alertness: Awake/alert Behavior During Therapy: WFL for tasks assessed/performed Overall Cognitive Status: Within Functional Limits for tasks assessed                                     General Comments  Pt with veritgo and required rest breaks throughout session    Exercises     Shoulder Instructions      Home Living Family/patient expects to be discharged to:: Private residence Living Arrangements: Other relatives ([place]PHEW [place]) Available Help at Discharge: Family;Available PRN/intermittently;Personal care attendant (aide 2-3 hours per day) Type of Home: House Home Access: Stairs to enter [employer] of Steps: 1 Entrance Stairs-Rails: None Home Layout: One level     Bathroom Shower/Tub: Occupational psychologist: Standard Bathroom Accessibility: Yes   Home Equipment: Cane - single point;Shower seat;Walker - 4 wheels;Bedside commode;Hand held shower head;Adaptive equipment Adaptive Equipment: Reacher;Sock aid        Prior Functioning/Environment Level of Independence: Needs assistance  Gait / Transfers Assistance Needed: pt reported that she ambulates with use of rollator ADL's / Homemaking Assistance Needed: Aide assists with LB dressing, bathing, meal prep, and housekeeping            OT Problem List: Decreased strength;Decreased range of motion;Decreased activity tolerance;Impaired balance (sitting and/or standing);Decreased knowledge of use of DME or AE;Decreased knowledge of precautions;Obesity;Pain      OT Treatment/Interventions:      OT Goals(Current goals can be found in the care plan section) Acute Rehab OT Goals Patient Stated Goal: to go home OT Goal Formulation: With patient Time For Goal Achievement: 12/20/16 Potential to Achieve Goals: Good  OT Frequency:     Barriers to D/C:             Co-evaluation              AM-PAC PT "6 Clicks" Daily Activity     Outcome Measure Help from another person eating meals?: None Help from another person taking care of personal grooming?: A Little Help from another person toileting, which includes using toliet, bedpan, or urinal?: A Little Help from another person bathing (including washing, rinsing, drying)?: A Lot Help from another person to put on and taking off regular upper body clothing?: A Little Help from another person to put on and taking off regular lower body clothing?: A Lot 6 Click Score: 17   End of Session Equipment Utilized During Treatment: Rolling walker Nurse Communication: Mobility status  Activity Tolerance: Patient tolerated treatment well;Patient limited by pain Patient left: in bed;with call bell/phone within reach  OT Visit Diagnosis: Unsteadiness on feet (R26.81);Pain;Other abnormalities of gait and mobility (R26.89);Muscle weakness (generalized) (M62.81) Pain - Right/Left:  (Back) Pain - part of body:  (Back)                Time: 5093-2671 OT Time Calculation (min): 24 min Charges:  OT General Charges $OT Visit: 1 Visit OT Evaluation $OT Eval Moderate Complexity: 1 Mod OT Treatments $Self Care/Home Management : 8-22 mins G-Codes: OT G-codes **NOT FOR INPATIENT CLASS** Functional Assessment Tool Used: Clinical judgement Functional Limitation: Self care  Self Care Current Status [phone]): At least 40 percent but less than 60 percent impaired, limited or restricted Self Care Goal Status (I1642): At least 20 percent but less than 40 percent impaired, limited or restricted Self Care Discharge Status [phone]): At least 40 percent but less than 60 percent impaired, limited or restricted   [place], OTR/L Acute Rehab Pager: [phone] Office: [place] 12/06/2016, 9:33 AM

## 2016-12-06 NOTE — Care Management Note (Signed)
Case Management Note  Patient Details  Name: [name] MRN: [id number] Date of Birth: Jun 25, 1955  Subjective/Objective:   61 y.o. female who is s/p left L5-S1 microdiscectomy to have HHPT and she has chosen [facility] to provide this care. CM alerted Jermaine with [facility].                  Action/Plan: CM will sign off for now but will be available should additional discharge needs arise or disposition change.    Expected Discharge Date:                  Expected Discharge Plan:     In-House Referral:     Discharge planning Services     Post Acute Care Choice:    Choice offered to:     DME Arranged:    DME Agency:     HH Arranged:    HH Agency:     Status of Service:     If discussed at [employer], dates discussed:    Additional Comments:  [name], RN 12/06/2016, 9:03 AM

## 2016-12-06 NOTE — Progress Notes (Signed)
   Subjective: 1 Day Post-Op Procedure(s) (LRB): LEFT L5-S1 MICRODISCECTOMY (N/A) Patient reports pain as moderate.  Incisional pain. Leg pain relief. Ambulatory. " I'm ready to go home. "  Objective: Vital signs in last 24 hours: Temp:  [98 F (36.7 C)-98.3 F (36.8 C)] 98.3 F (36.8 C) (10/20 0800) Pulse Rate:  [85-98] 88 (10/20 0800) Resp:  [8-20] 18 (10/20 0800) BP: (91-155)/(46-121) 107/46 (10/20 0800) SpO2:  [91 %-100 %] 95 % (10/20 0800)  Intake/Output from previous day: 10/19 0701 - 10/20 0700 In: 1243 [P.O.:240; I.V.:1003] Out: 100 [Blood:100] Intake/Output this shift: No intake/output data recorded.   Recent Labs  12/04/16 1102  HGB 9.9*    Recent Labs  12/04/16 1102  WBC 9.7  RBC 3.85*  HCT 32.1*  PLT 230    Recent Labs  12/04/16 1102  NA 135  K 4.1  CL 98*  CO2 27  BUN 16  CREATININE 1.29*  GLUCOSE 111*  CALCIUM 9.2    Recent Labs  12/04/16 1102  INR 0.99    Neurologically intact Dg Lumbar Spine 2-3 Views  Result Date: 12/05/2016 CLINICAL DATA:  Lumbar surgery. EXAM: LUMBAR SPINE - 2-3 VIEW COMPARISON:  CT 04/02/2016. FINDINGS: Metallic marker noted posteriorly at the level of the upper sacrum on image number 3. IMPRESSION: Metallic marker noted posteriorly along the upper portion of the sacrum on image number 3 . Electronically Signed   By: Marcello Moores  Register   On: 12/05/2016 13:28    Assessment/Plan: 1 Day Post-Op Procedure(s) (LRB): LEFT L5-S1 MICRODISCECTOMY (N/A) Plan:  Discharge home office one week.   [name] 12/06/2016, 10:32 AM

## 2016-12-06 NOTE — Progress Notes (Signed)
Patient alert and oriented, mae's well, voiding adequate amount of urine, swallowing without difficulty, no c/o pain at time of discharge. Patient discharged home with family. Script and discharged instructions given to patient. Patient and family stated understanding of instructions given. Patient has an appointment with Dr. Lorin Mercy

## 2016-12-06 NOTE — Progress Notes (Signed)
Physical Therapy Treatment Patient Details Name: [name] MRN: [id number] DOB: Feb 02, 1956 Today's Date: 12/06/2016    History of Present Illness Pt is a 61 y.o. female who is s/p left L5-S1 microdiscectomy.     PT Comments    Patient is not progressing well toward mobility goals. She is unable to tolerate progression. She demonstrates better compliance with log roll technique still needing physical assistance. Patient was able to recall precautions. Still feel patient is unsafe to d/c home with limited caregiver assist. She will continue to benefit from skilled therapy services. PT will follow acutely and progress as tolerated.   Follow Up Recommendations  Home health PT;Supervision/Assistance - 24 hour (recommending SNF patient refusing)     Equipment Recommendations  None recommended by PT    Recommendations for Other Services       Precautions / Restrictions Precautions Precautions: Fall;Back Precaution Comments: Reviewed back precautions. Pt able to recall 3/3  Required Braces or Orthoses:  (No brace per MD order) Restrictions Weight Bearing Restrictions: No    Mobility  Bed Mobility Overal bed mobility: Needs Assistance Bed Mobility: Sidelying to Sit;Sit to Sidelying;Rolling Rolling: Supervision Sidelying to sit: Min guard     Sit to sidelying: Min assist General bed mobility comments: VCs for compliance with log roll technique. Min guard for safety while pt pushed up to sitting. Min A to return LEs onto bed.  Transfers Overall transfer level: Needs assistance Equipment used: Rolling walker (2 wheeled) Transfers: Sit to/from Stand Sit to Stand: Min guard         General transfer comment: Min Guard for safety  Ambulation/Gait Ambulation/Gait assistance: Min guard Ambulation Distance (Feet): 80 Feet Assistive device: Rolling walker (2 wheeled) Gait Pattern/deviations: Step-to pattern;Step-through pattern;Decreased step length - right;Decreased step  length - left;Antalgic;Decreased dorsiflexion - right;Decreased dorsiflexion - left;Decreased stride length;Trunk flexed;Narrow base of support;Drifts right/left Gait velocity: decreased Gait velocity interpretation: <1.8 ft/sec, indicative of risk for recurrent falls General Gait Details: pt is still very slow and guarded with gait. unable to progress secondary to pain. Min guard throughout; no significant LOB this session. Cues for posture   Stairs            Wheelchair Mobility    Modified Rankin (Stroke Patients Only)       Balance Overall balance assessment: Needs assistance Sitting-balance support: Feet supported;No upper extremity supported Sitting balance-Leahy Scale: Fair     Standing balance support: During functional activity;Bilateral upper extremity supported Standing balance-Leahy Scale: Poor Standing balance comment: reliant on RW for support                            Cognition Arousal/Alertness: Awake/alert Behavior During Therapy: WFL for tasks assessed/performed Overall Cognitive Status: Within Functional Limits for tasks assessed                                        Exercises      General Comments General comments (skin integrity, edema, etc.): Pt reports dizziness (vertigo) and needing some rest breaks throughout session.      Pertinent Vitals/Pain Pain Assessment: 0-10 Pain Score: 7  Faces Pain Scale: Hurts whole lot Pain Location: back Pain Descriptors / Indicators: Sharp;Discomfort Pain Intervention(s): Monitored during session;Limited activity within patient's tolerance    Home Living Family/patient expects to be discharged to:: Private residence Living Arrangements: Other relatives (  NEPHEW LIVES WITH HER) Available Help at Discharge: Family;Available PRN/intermittently;Personal care attendant (aide 2-3 hours per day) Type of Home: House Home Access: Stairs to enter Entrance Stairs-Rails: None Home  Layout: One level Home Equipment: Cane - single point;Shower seat;Walker - 4 wheels;Bedside commode;Hand held shower head;Adaptive equipment      Prior Function Level of Independence: Needs assistance  Gait / Transfers Assistance Needed: pt reported that she ambulates with use of rollator ADL's / Homemaking Assistance Needed: Aide assists with LB dressing, bathing, meal prep, and housekeeping     PT Goals (current goals can now be found in the care plan section) Acute Rehab PT Goals Patient Stated Goal: to go home PT Goal Formulation: With patient Time For Goal Achievement: 12/19/16 Potential to Achieve Goals: Fair Progress towards PT goals: Not progressing toward goals - comment (pt unable to tolerate much mobility progression)    Frequency    Min 5X/week      PT Plan Current plan remains appropriate    Co-evaluation              AM-PAC PT "6 Clicks" Daily Activity  Outcome Measure  Difficulty turning over in bed (including adjusting bedclothes, sheets and blankets)?: Unable Difficulty moving from lying on back to sitting on the side of the bed? : Unable Difficulty sitting down on and standing up from a chair with arms (e.g., wheelchair, bedside commode, etc,.)?: Unable Help needed moving to and from a bed to chair (including a wheelchair)?: A Lot Help needed walking in hospital room?: A Lot Help needed climbing 3-5 steps with a railing? : Total 6 Click Score: 8    End of Session Equipment Utilized During Treatment: Gait belt Activity Tolerance: Patient limited by pain Patient left: in bed;with call bell/phone within reach Nurse Communication: Mobility status PT Visit Diagnosis: Unsteadiness on feet (R26.81);Muscle weakness (generalized) (M62.81);Other abnormalities of gait and mobility (R26.89)     Time: 7482-7078 PT Time Calculation (min) (ACUTE ONLY): 15 min  Charges:  $Gait Training: 8-22 mins                    G CodesSindy Guadel[place],  SPT [phone] office    Margarita Grizzle 12/06/2016, 9:47 AM

## 2016-12-09 ENCOUNTER — Telehealth (INDEPENDENT_AMBULATORY_CARE_PROVIDER_SITE_OTHER): Payer: Self-pay | Admitting: Orthopaedic Surgery

## 2016-12-09 NOTE — Telephone Encounter (Signed)
[name] (PT) with [facility] called advised went out to do start of care for the patient. Erline Levine advised can not reach the patient. She left message for patient to return call. The number to contact Erline Levine is [phone]

## 2016-12-10 NOTE — Telephone Encounter (Signed)
fyi

## 2016-12-10 NOTE — Telephone Encounter (Signed)
Noted thanks °

## 2016-12-11 ENCOUNTER — Ambulatory Visit: Payer: Medicare Other | Admitting: Neurology

## 2016-12-12 ENCOUNTER — Telehealth (INDEPENDENT_AMBULATORY_CARE_PROVIDER_SITE_OTHER): Payer: Self-pay | Admitting: Orthopaedic Surgery

## 2016-12-12 NOTE — Telephone Encounter (Signed)
I just want pt to walk daily and increase distance. Does not need HHPT thanks

## 2016-12-12 NOTE — Telephone Encounter (Signed)
Sheena from [facility] called stating that the patient told her that Dr. Lorin Mercy did not want her to start anything until two weeks after surgery.  Vita Barley also stated that the has been difficult to get a hold of to start to PT.  The therapist has tried several times.  CB#[phone].  Thank you.

## 2016-12-12 NOTE — Telephone Encounter (Signed)
Please advise on whether you want patient to begin home health PT.  She had L5-S1 microdiscectomy.

## 2016-12-15 NOTE — Telephone Encounter (Signed)
I called [name] and advised.

## 2016-12-17 ENCOUNTER — Inpatient Hospital Stay (INDEPENDENT_AMBULATORY_CARE_PROVIDER_SITE_OTHER): Payer: Medicare Other | Admitting: Orthopaedic Surgery

## 2016-12-17 NOTE — Discharge Summary (Signed)
Patient ID: [name] MRN: [id number] DOB/AGE: 1955/08/11 61 y.o.  Admit date: 12/05/2016 Discharge date: 12/17/2016  Admission Diagnoses:  Active Problems:   HNP (herniated nucleus pulposus), lumbar   Discharge Diagnoses:  Active Problems:   HNP (herniated nucleus pulposus), lumbar  status post Procedure(s): LEFT L5-S1 MICRODISCECTOMY  Past Medical History:  Diagnosis Date  . Anemia   . Anxiety   . Arthritis    "pretty much all my joints"  . Asthma   . Benign paroxysmal positional vertigo 12/30/2012  . Breast cancer ([name]) 02/13/12   ruq  100'clock bx Ductal Carcinoma in Situ,(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  . Breast mass in female    L breast 2008, US showed likely fat necrosis  . Chronic lower back pain   . CKD (chronic kidney disease), stage III ([name])    "lower stage" (01/06/2014)  . Daily headache    last time was 31-Aug-2016 (brother passed away)  . Depression   . Dyspnea   . Gait abnormality 05/29/2016  . GERD (gastroesophageal reflux disease)   . History of blood transfusion 2011   "after one of my OR's"  . History of hiatal hernia   . History of stomach ulcers   . HSV (herpes simplex virus) infection   . Hx of radiation therapy 05/05/12- 07/15/12   right breast, 45 gray x 25 fx, lumpectomy cavity boosted to 16.2 gray  . Hyperlipemia   . Hypertension    sees Dr. Criss Rosales , [facility]  . Hypothyroidism   . Kidney stones   . Knee pain, bilateral   . Migraines    "~ 3 times/month" (01/06/2014)  . Obesity   . OSA on CPAP    pt does not know settings  . Pancreatitis    hx of  . Personal history of radiation therapy 02/2012  . Pneumonia 1950's  . Polyneuropathy in diabetes(357.2)   . Schizophrenia ([place])   . 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 Tachycardia    with sx in 2008  . Thyroid disease   . Type II diabetes mellitus ([place])    dx 2014    Surgeries: Procedure(s): LEFT L5-S1 MICRODISCECTOMY on 12/05/2016   Consultants:   Discharged  Condition: Improved  Hospital Course: [name] is an 61 y.o. female who was admitted 12/05/2016 for operative treatment of lumbar HNP. Patient failed conservative treatments (please see the history and physical for the specifics) and had severe unremitting pain that affects sleep, daily activities and work/hobbies. After pre-op clearance, the patient was taken to the operating room on 12/05/2016 and underwent  Procedure(s): LEFT L5-S1 MICRODISCECTOMY.    Patient was given perioperative antibiotics:  Anti-infectives    Start     Dose/Rate Route Frequency Ordered Stop   12/05/16 1445  ceFAZolin (ANCEF) IVPB 1 g/50 mL premix     1 g 100 mL/hr over 30 Minutes Intravenous Every 8 hours 12/05/16 1443 12/05/16 2207   12/05/16 0830  vancomycin (VANCOCIN) 1,500 mg in sodium chloride 0.9 % 500 mL IVPB     1,500 mg 250 mL/hr over 120 Minutes Intravenous  Once 12/05/16 0801 12/05/16 1041   12/05/16 0700  ceFAZolin (ANCEF) 3 g in dextrose 5 % 50 mL IVPB  Status:  Discontinued     3 g 130 mL/hr over 30 Minutes Intravenous To [facility] 12/04/16 0729 12/05/16 1442       Patient was given sequential compression devices and early ambulation to prevent DVT.   Patient benefited maximally  from hospital stay and there were no complications. At the time of discharge, the patient was urinating/moving their bowels without difficulty, tolerating a regular diet, pain is controlled with oral pain medications and they have been cleared by PT/OT.   Recent vital signs: No data found.    Recent laboratory studies: No results for input(s): WBC, HGB, HCT, PLT, NA, K, CL, CO2, BUN, CREATININE, GLUCOSE, INR, CALCIUM in the last 72 hours.  Invalid input(s): PT, 2   Discharge Medications:   Allergies as of 12/06/2016      Reactions   Bee Venom Anaphylaxis   Shellfish Allergy Anaphylaxis   Buprenorphine Hcl Other (See Comments)   Overly sedated   Other Other (See Comments)   Cats - bad asthma,  stopped up      Medication List    STOP taking these medications   diclofenac 75 MG EC tablet Commonly known as:  VOLTAREN   ibuprofen 200 MG tablet Commonly known as:  ADVIL,MOTRIN   Oxycodone HCl 10 MG Tabs     TAKE these medications   aspirin EC 81 MG tablet Take 1 tablet (81 mg total) by mouth daily.   B-complex with vitamin C tablet Take 1 tablet by mouth daily.   budesonide-formoterol 80-4.5 MCG/ACT inhaler Commonly known as:  SYMBICORT Inhale 2 puffs into the lungs 2 (two) times daily. What changed:  how much to take  when to take this   calcitRIOL 0.25 MCG capsule Commonly known as:  ROCALTROL Take 1 capsule (0.25 mcg total) by mouth 3 (three) times a week. What changed:  when to take this   diphenhydrAMINE 25 MG tablet Commonly known as:  BENADRYL Take 50 mg by mouth every 6 (six) hours as needed for itching or allergies.   doxepin 25 MG capsule Commonly known as:  SINEQUAN Take 50 mg by mouth at bedtime.   esomeprazole 40 MG capsule Commonly known as:  NEXIUM Take 1 capsule (40 mg total) by mouth daily before breakfast. What changed:  when to take this   furosemide 40 MG tablet Commonly known as:  LASIX Take 1 tablet (40 mg total) by mouth daily.   GERITOL PO Take 1 tablet by mouth daily.   HUMALOG KWIKPEN 100 UNIT/ML KiwkPen Generic drug:  insulin lispro Inject 10 Units into the skin 3 (three) times daily.   insulin aspart 100 UNIT/ML injection Commonly known as:  novoLOG Inject 0-9 Units into the skin 3 (three) times daily with meals. 0-9 Units, Subcutaneous CBG < 70: implement hypoglycemia protocol CBG 70 - 120: 0 units CBG 121 - 150: 1 unit CBG 151 - 200: 2 units CBG 201 - 250: 3 units CBG 251 - 300: 5 units CBG 301 - 350: 7 units CBG 351 - 400: 9 units CBG > 400: call MD   JANUMET 50-500 MG tablet Generic drug:  sitaGLIPtin-metformin Take 1 tablet by mouth 2 (two) times daily with a meal.   LEVEMIR FLEXTOUCH 100 UNIT/ML Pen Generic  drug:  Insulin Detemir Inject 10 Units into the skin at bedtime.   levothyroxine 150 MCG tablet Commonly known as:  SYNTHROID, LEVOTHROID Take 1 tablet (150 mcg total) by mouth daily before breakfast. What changed:  Another medication with the same name was added. Make sure you understand how and when to take each.   levothyroxine 150 MCG tablet Commonly known as:  SYNTHROID, LEVOTHROID Take 1 tablet (150 mcg total) by mouth daily before breakfast. What changed:  You were already taking a medication  with the same name, and this prescription was added. Make sure you understand how and when to take each.   LINZESS 290 MCG Caps capsule Generic drug:  linaclotide Take 290 mcg by mouth daily before breakfast.   LORazepam 2 MG tablet Commonly known as:  ATIVAN Take 1-2 mg by mouth See admin instructions. Take 1 mg by mouth in the morning and take 2 mg by mouth at bedtime   losartan-hydrochlorothiazide 100-12.5 MG tablet Commonly known as:  HYZAAR Take 1 tablet by mouth daily.   LYRICA 150 MG capsule Generic drug:  pregabalin Take 150 mg by mouth 2 (two) times daily.   multivitamin with minerals Tabs tablet Take 1 tablet by mouth daily.   oxyCODONE-acetaminophen 10-325 MG tablet Commonly known as:  PERCOCET Take 1 tablet by mouth every 6 (six) hours as needed for pain.   POTASSIUM PO Take 1 tablet by mouth daily.   PROAIR HFA 108 (90 Base) MCG/ACT inhaler Generic drug:  albuterol Inhale 2 puffs into the lungs every 4 (four) hours as needed for wheezing or shortness of breath.   rosuvastatin 20 MG tablet Commonly known as:  CRESTOR Take 20 mg by mouth daily.   tiZANidine 4 MG tablet Commonly known as:  ZANAFLEX Take 4 mg by mouth 2 (two) times daily.   valACYclovir 1000 MG tablet Commonly known as:  VALTREX Take 1,000 mg by mouth daily.   venlafaxine XR 150 MG 24 hr capsule Commonly known as:  EFFEXOR-XR Take 150 mg by mouth 2 (two) times daily.   VITAMIN B-12  PO Take 1 tablet by mouth daily.   Vitamin D-3 1000 units Caps Take 1,000 Units by mouth daily.   ziprasidone 20 MG capsule Commonly known as:  GEODON Take 1 capsule (20 mg total) by mouth 2 (two) times daily with a meal. What changed:  how much to take  when to take this       Diagnostic Studies: Dg Lumbar Spine 2-3 Views  Result Date: 12/05/2016 CLINICAL DATA:  Lumbar surgery. EXAM: LUMBAR SPINE - 2-3 VIEW COMPARISON:  CT 04/02/2016. FINDINGS: Metallic marker noted posteriorly at the level of the upper sacrum on image number 3. IMPRESSION: Metallic marker noted posteriorly along the upper portion of the sacrum on image number 3 . Electronically Signed   By: Marcello Moores  Register   On: 12/05/2016 13:28      Follow-up Information    Schedule an appointment as soon as possible for a visit with Marybelle Killings, MD.   Specialty:  Orthopedic Surgery Why:  need return office visit one week postop Contact information: [place] [address] [phone]        [facility] Follow up.   Why:  HHPT has been ordered by your MD and will be provided by the above agency. A representative from this agency will contact you within 24 hours of discharge to make arrangements for your initial visit.  Contact information: [address] [place] [address] [phone]           Discharge Plan:  discharge to home  Disposition:     Signed: Benjiman Core  12/17/2016, 1:48 PM

## 2016-12-18 IMAGING — CR DG LUMBAR SPINE COMPLETE 4+V
5 series · 5 of 5 positions shown · non-contrast
Comparison: Lumbar spine series August 14, 2014 and lumbar MRI August 14, 2014

CLINICAL DATA: Lumbago

EXAM:
LUMBAR SPINE - COMPLETE 4+ VIEW

[l-spine ap]
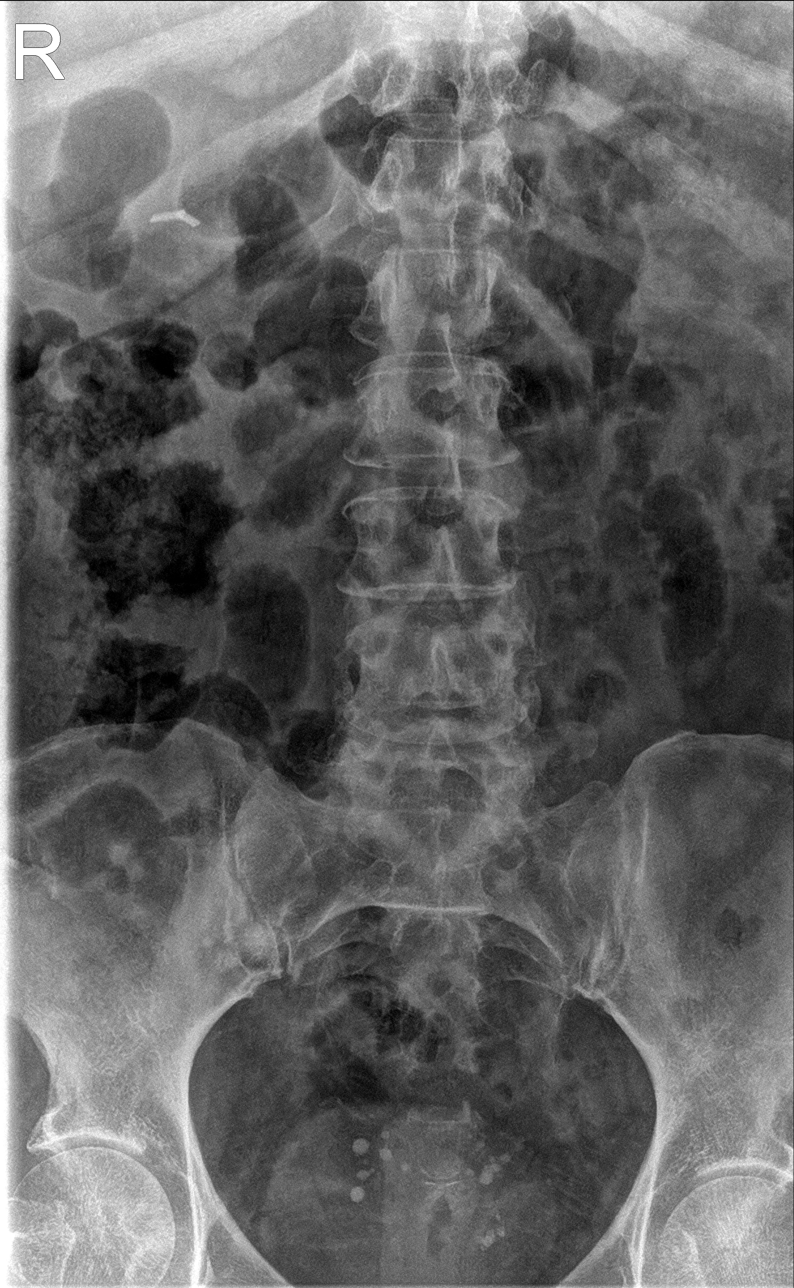

[l-spine obl (1 of 2)]
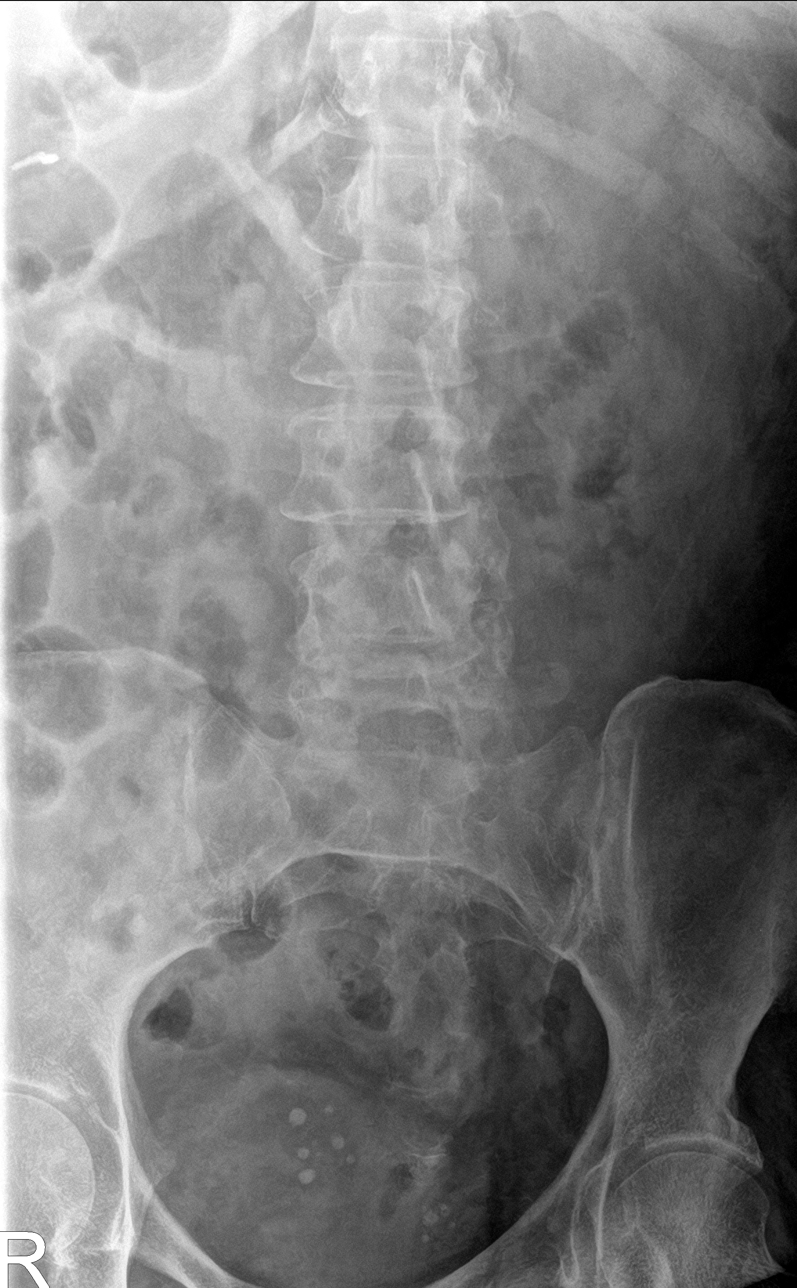

[l-spine obl (2 of 2)]
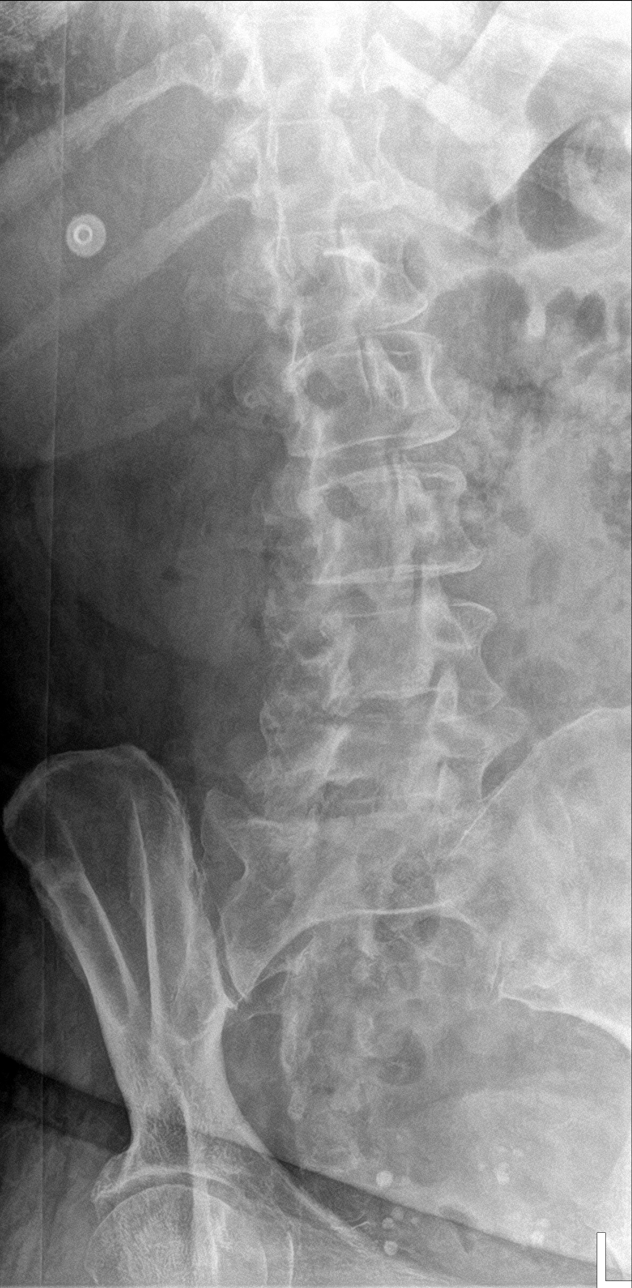

[l-spine lat]
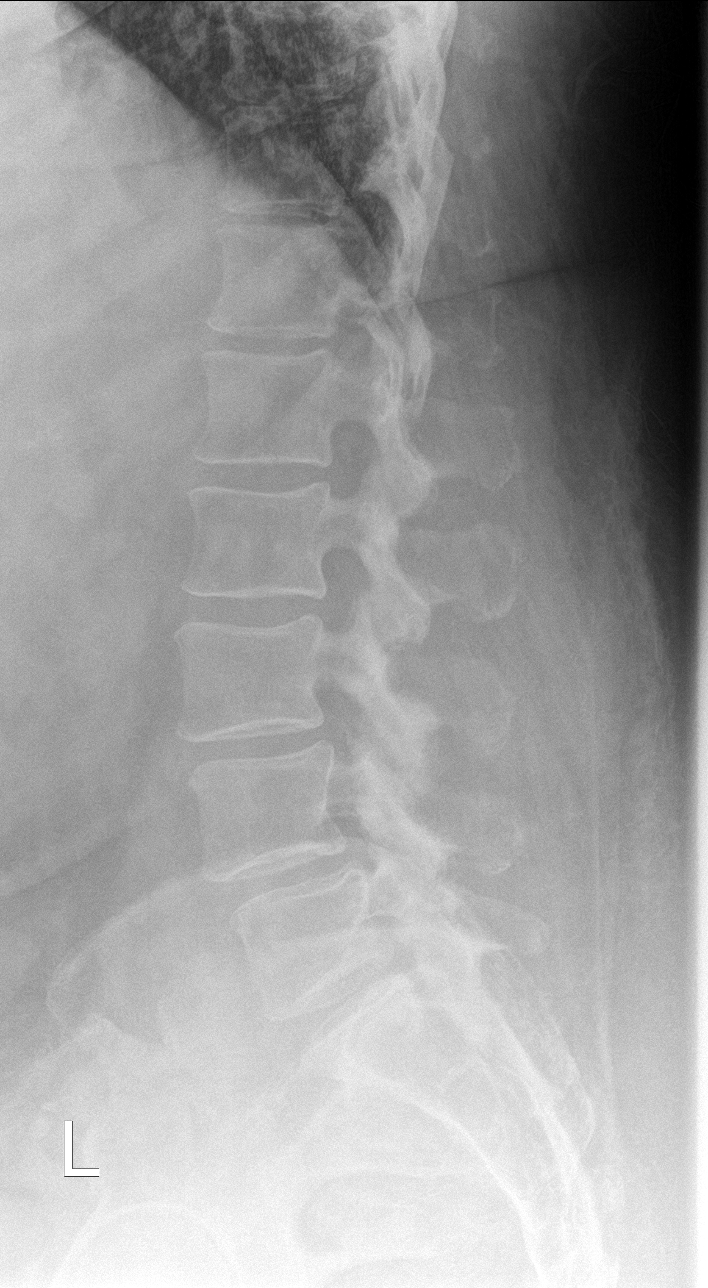

[l-spine spot]
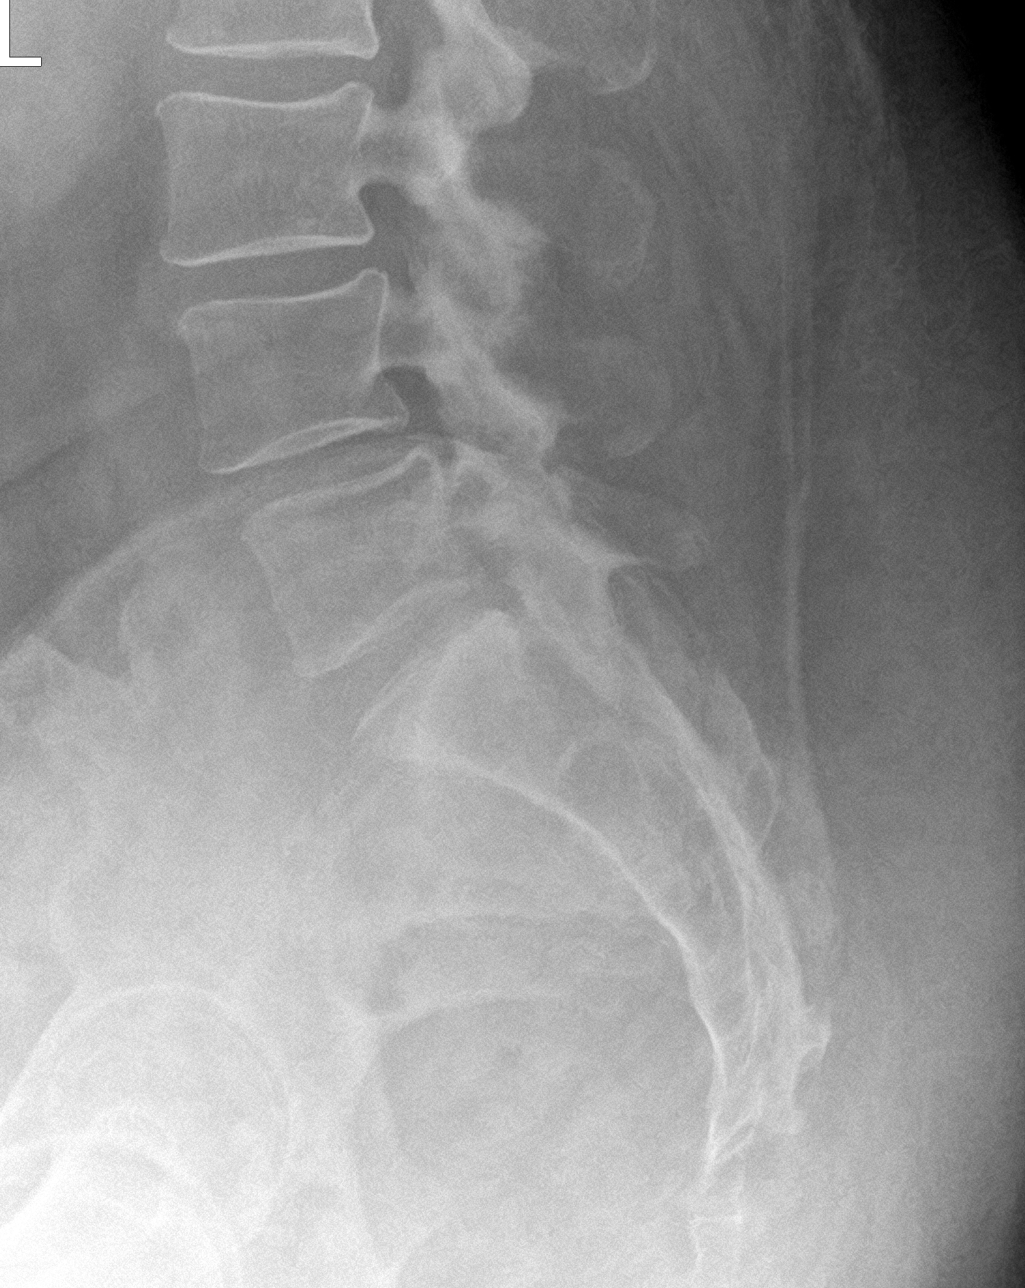

[5 of 5 positions shown; findings below may reference images not displayed]

FINDINGS: Frontal, lateral, spot lumbosacral lateral, and bilateral oblique
views were obtained. There are 5 non-rib-bearing lumbar type
vertebral bodies. There is no fracture or spondylolisthesis. There
is mild disc space narrowing at L3-4, L4-5, and L5-S1. There is
facet osteoarthritic change at L5-S1 bilaterally.
IMPRESSION: Areas of osteoarthritic change, essentially stable. No fracture or
spondylolisthesis.

## 2016-12-18 IMAGING — CT CT ABD-PELV W/ CM
2 of 5 series · 17 of 46 positions shown, 19 images · IV contrast (APPLIED)
Comparison: None.

CLINICAL DATA: Patient with mid abdominal pain, nausea vomiting and
diarrhea.

EXAM:
CT ABDOMEN AND PELVIS WITH CONTRAST
TECHNIQUE: Multidetector CT imaging of the abdomen and pelvis was performed
using the standard protocol following bolus administration of
intravenous contrast.
CONTRAST:  75cc [id number]-866 IOPAMIDOL ([id number]-866) INJECTION 61%

[Series 2: abd/ pelvis 5.0 i30f 1 · axial · 0.90mm/px · z∈[+721,+1171]mm · 14 of 102 slices shown, 16 images]
[im 6/102  soft-tissue]
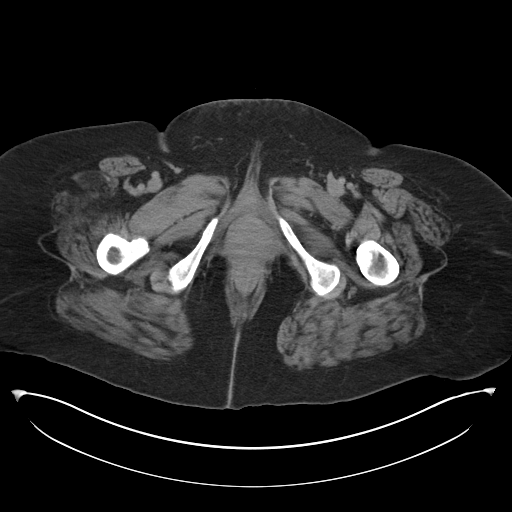
[im 6/102  bone]
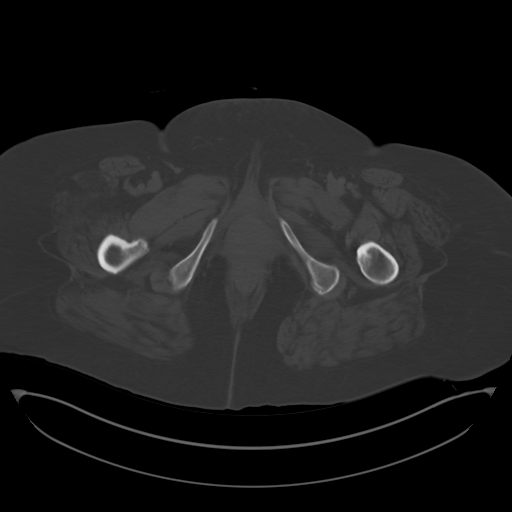
[im 12/102  soft-tissue]
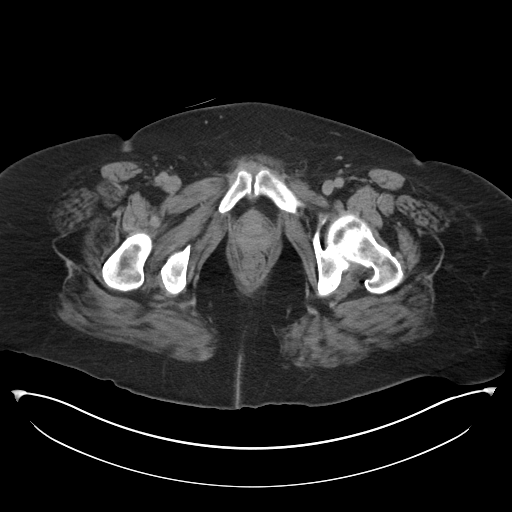
[im 23/102  soft-tissue]
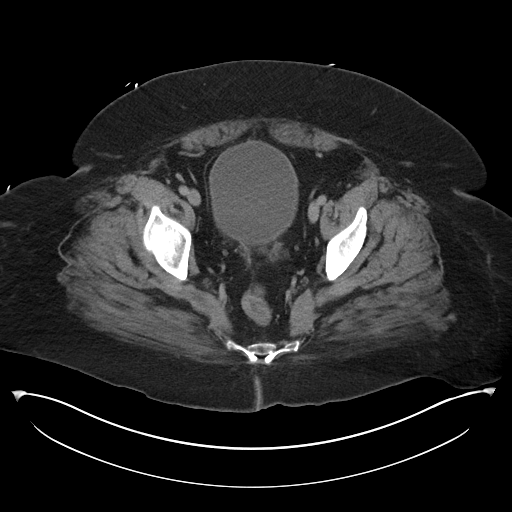
[im 29/102  soft-tissue]
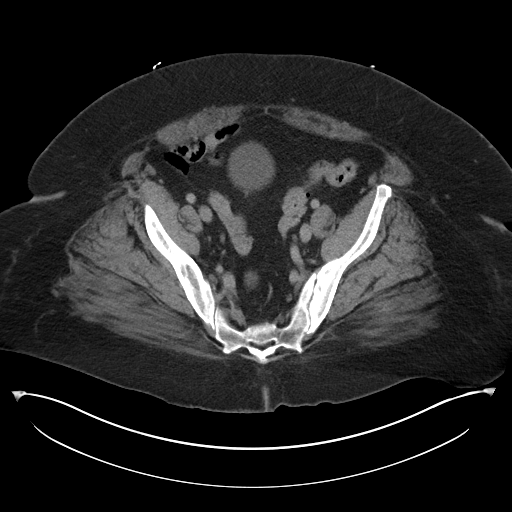
[im 34/102  soft-tissue]
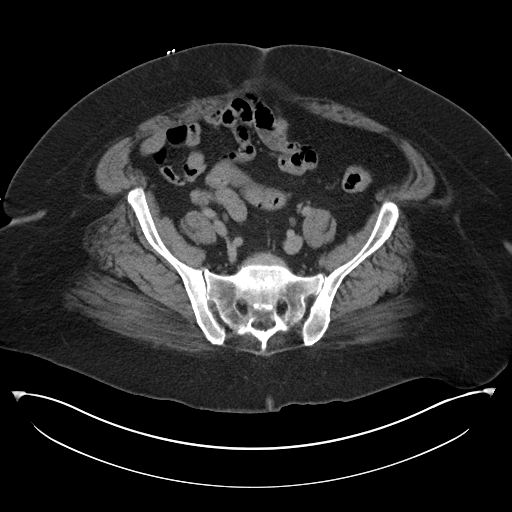
[im 40/102  soft-tissue]
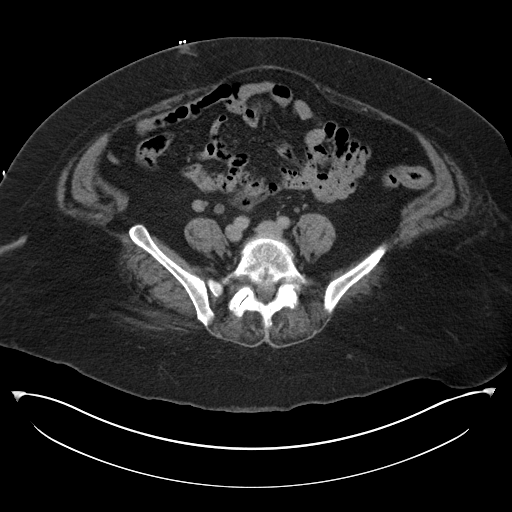
[im 45/102  soft-tissue]
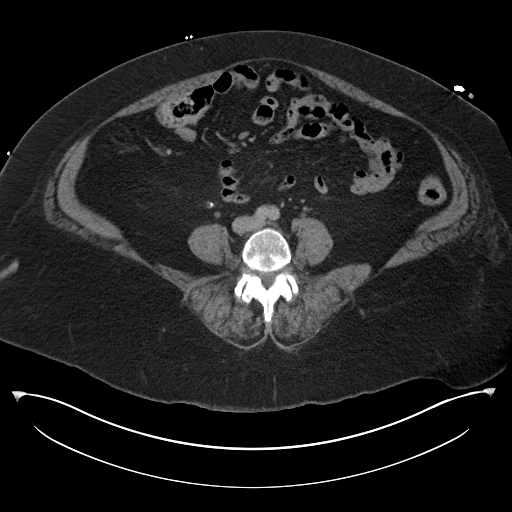
[im 57/102  soft-tissue]
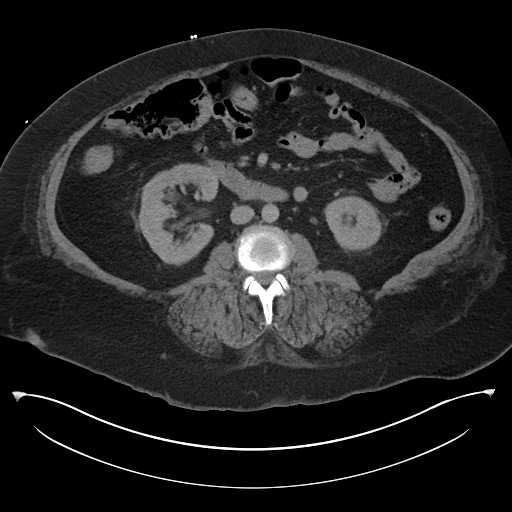
[im 62/102  soft-tissue]
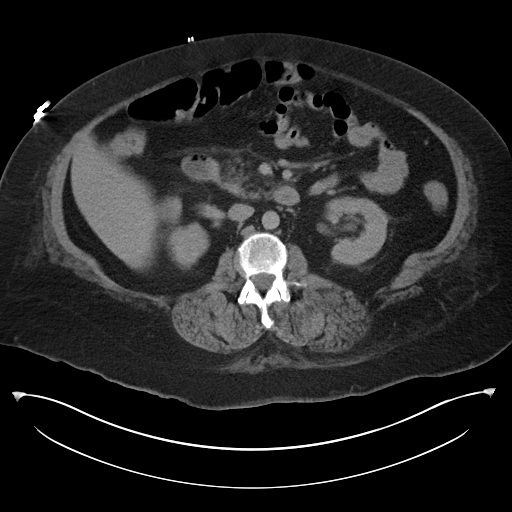
[im 62/102  bone]
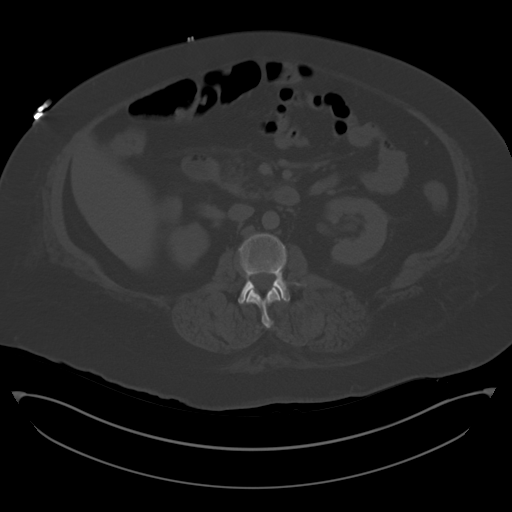
[im 68/102  soft-tissue]
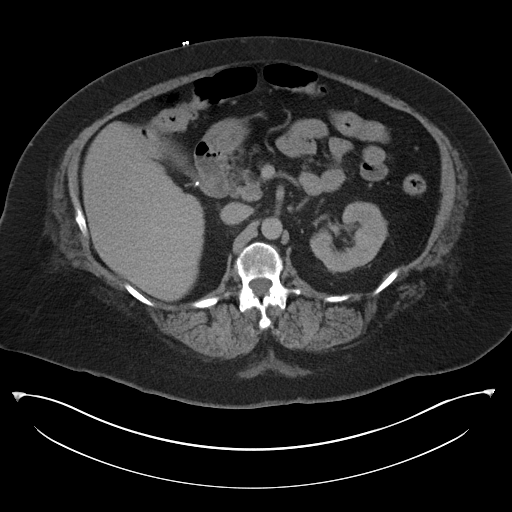
[im 73/102  soft-tissue]
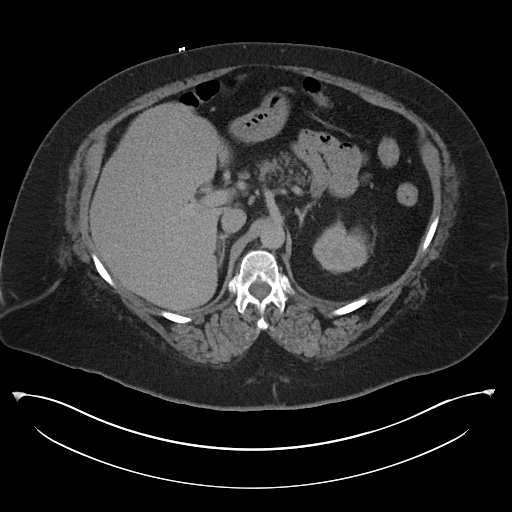
[im 79/102  soft-tissue]
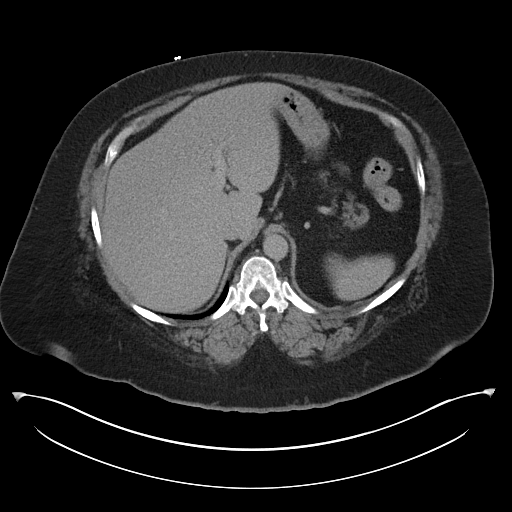
[im 90/102  soft-tissue]
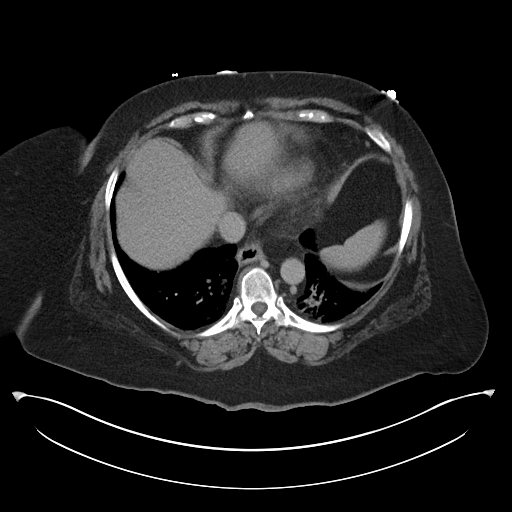
[im 96/102  soft-tissue]
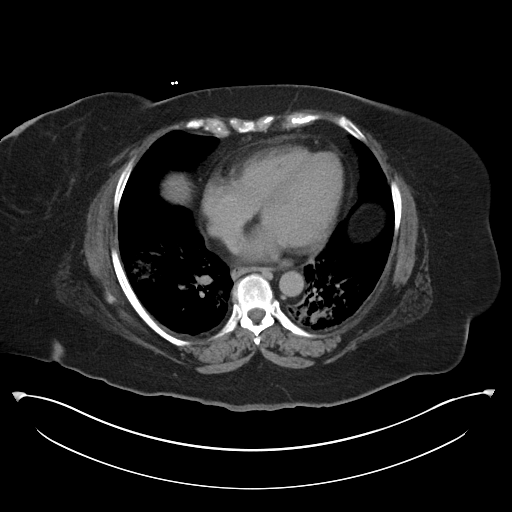

[Series 5: coronal soft tissue · coronal · 0.98mm/px · 3 of 101 slices shown]
[im 34/101  soft-tissue]
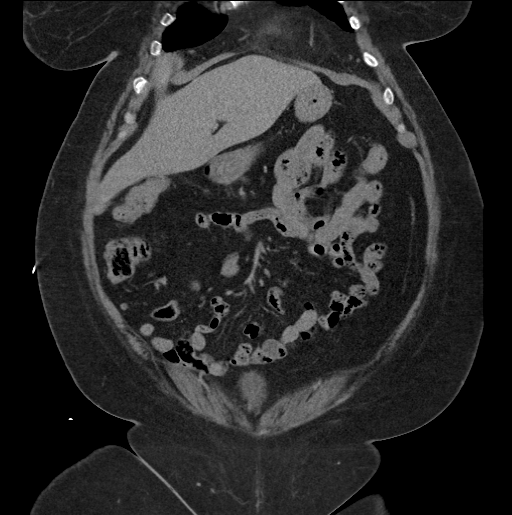
[im 45/101  soft-tissue]
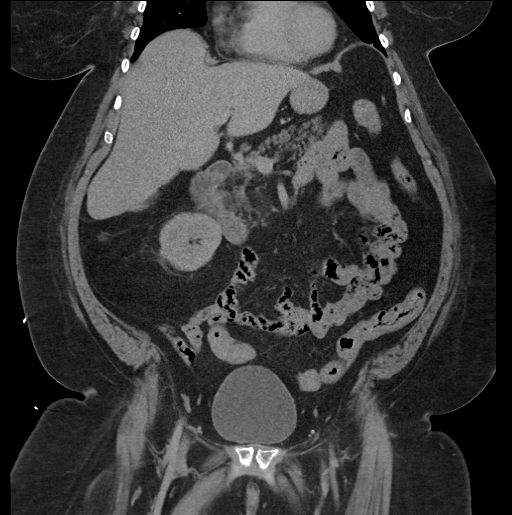
[im 56/101  soft-tissue]
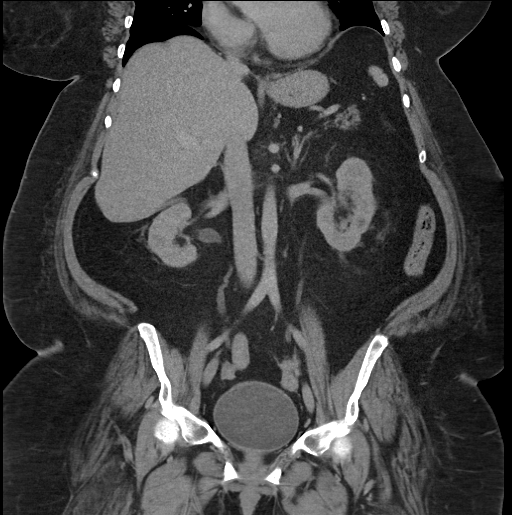

[17 of 46 positions shown; findings below may reference images not displayed]

FINDINGS: Lower chest: There are ground-glass and consolidative opacities
within the lower lobes bilaterally. Heart is enlarged. Small hiatal
hernia.

Hepatobiliary: Liver is normal in size and contour. No focal lesion
is identified. Patient status post cholecystectomy.

Pancreas: Fatty atrophy of the pancreatic parenchyma.

Spleen: Unremarkable

Adrenals/Urinary Tract: Normal adrenal glands. Kidneys enhance
symmetrically with contrast. No hydronephrosis. Urinary bladder is
unremarkable.

Stomach/Bowel: No abnormal bowel wall thickening or evidence for
bowel obstruction. No free fluid or free intraperitoneal air.

Vascular/Lymphatic: Normal caliber abdominal aorta. No
retroperitoneal lymphadenopathy.

Other: Uterus is surgically absent. Adnexal structures are
unremarkable.

Musculoskeletal: Lumbar spine degenerative changes. No aggressive or
acute appearing osseous lesions.
IMPRESSION: Ground-glass and consolidative opacities within the lower lobes
bilaterally may represent pneumonia in the appropriate clinical
setting. Recommend radiographic follow-up in 3- 4 weeks to ensure
resolution.

No acute process within the abdomen or pelvis.

## 2016-12-18 IMAGING — CR DG CHEST 1V
2 series · 2 of 2 positions shown · non-contrast
Comparison: Chest radiograph 11/12/2014

CLINICAL DATA: Patient status post fall in the bathroom. Lower back
pain. Code sepsis.

EXAM:
CHEST 1 VIEW

[chest ap (1 of 2)]
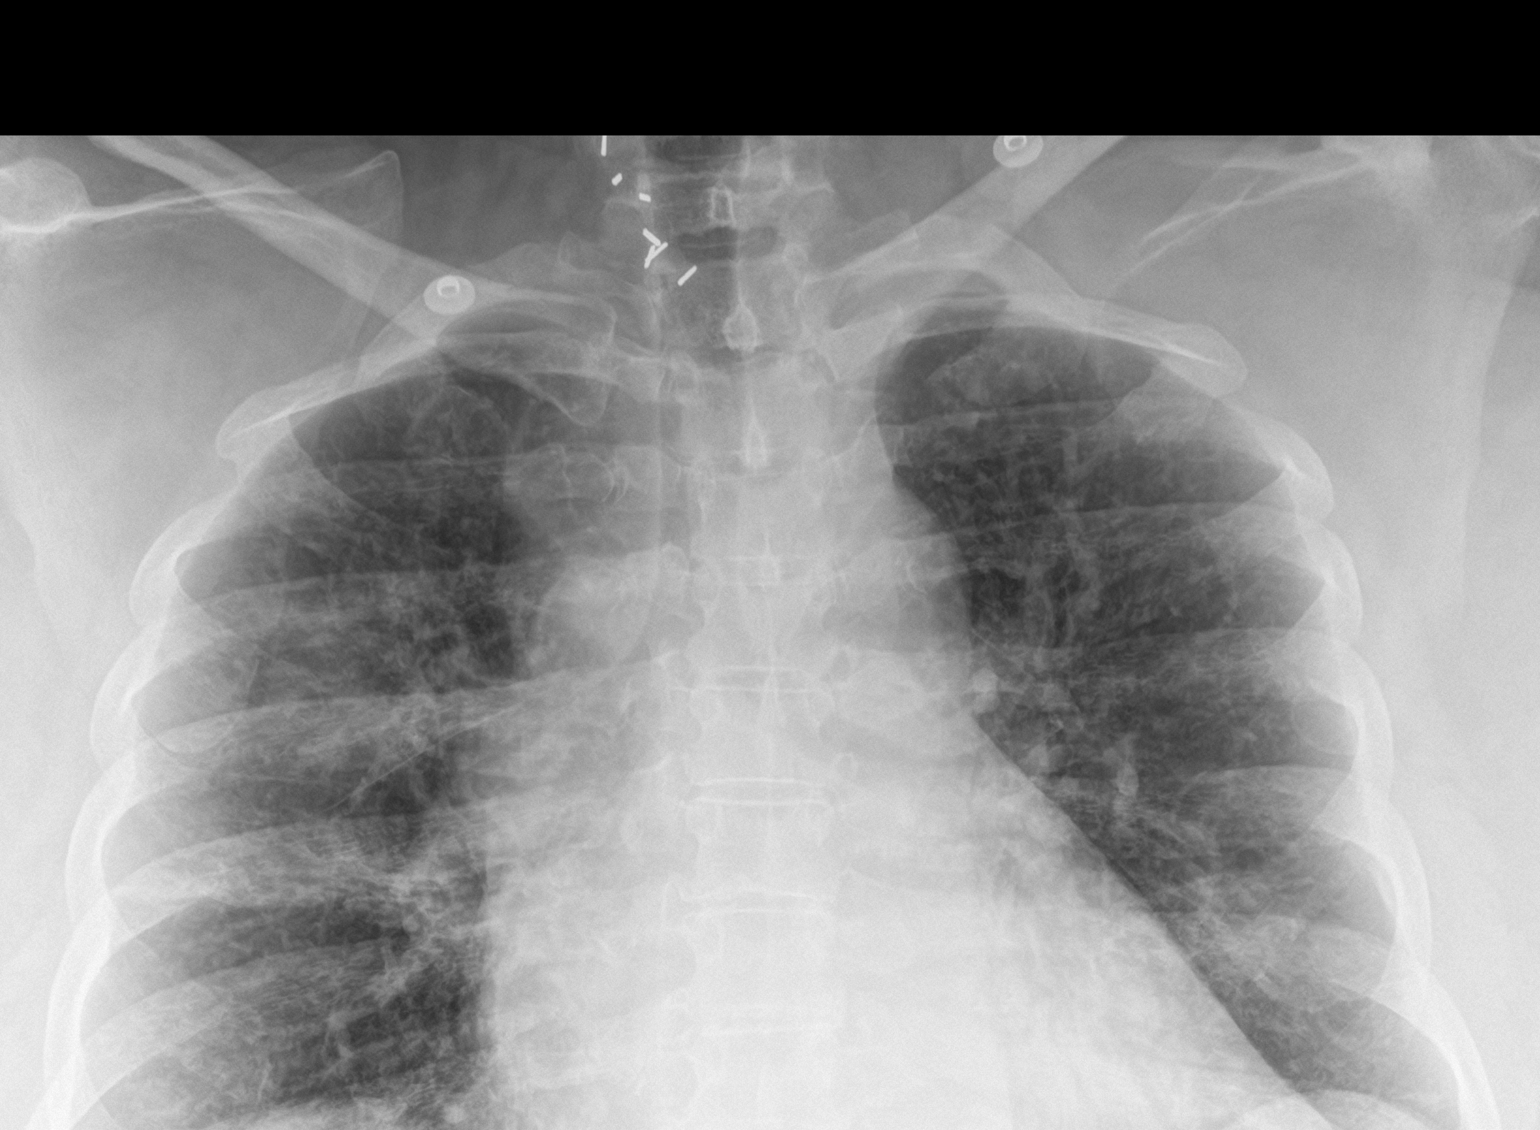

[chest ap (2 of 2)]
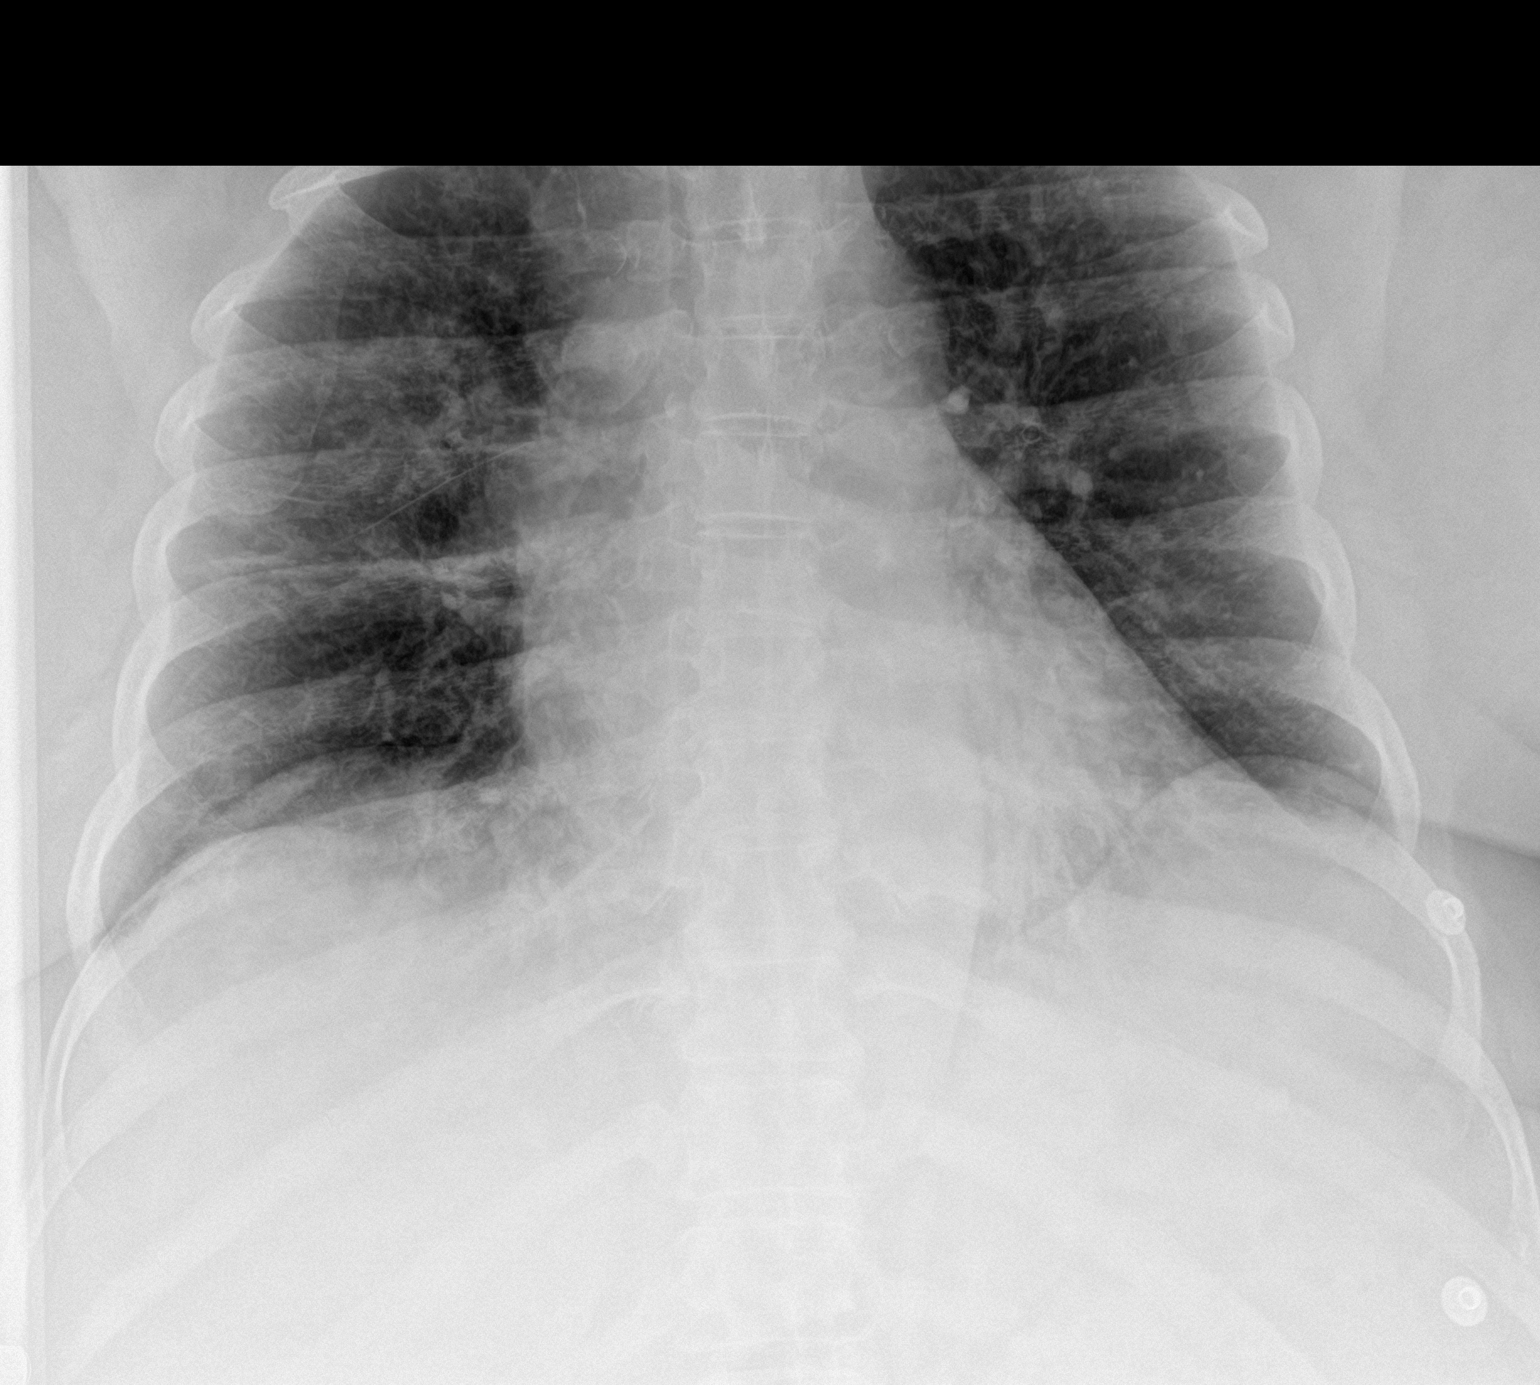

[2 of 2 positions shown; findings below may reference images not displayed]

FINDINGS: Surgical clips within the cervical soft tissue. Stable enlarged
cardiac and mediastinal contours. Right mid lung heterogeneous
opacities. Low lung volumes. Left basilar atelectasis. No pleural
effusion or pneumothorax.
IMPRESSION: Focal consolidation within the right mid lung concerning for
pneumonia. Followup PA and lateral chest X-ray is recommended in 3-4
weeks following trial of antibiotic therapy to ensure resolution and
exclude underlying malignancy.

## 2016-12-19 ENCOUNTER — Encounter (INDEPENDENT_AMBULATORY_CARE_PROVIDER_SITE_OTHER): Payer: Self-pay | Admitting: Orthopaedic Surgery

## 2016-12-19 ENCOUNTER — Ambulatory Visit (INDEPENDENT_AMBULATORY_CARE_PROVIDER_SITE_OTHER): Payer: Medicare Other | Admitting: Orthopaedic Surgery

## 2016-12-19 VITALS — Ht 65.0 in | Wt 320.0 lb

## 2016-12-19 DIAGNOSIS — M5126 Other intervertebral disc displacement, lumbar region: Secondary | ICD-10-CM

## 2016-12-19 MED ORDER — METHOCARBAMOL 500 MG PO TABS: 500.0000 mg | ORAL_TABLET | Freq: Three times a day (TID) | ORAL | 0 refills | Status: DC | PRN

## 2016-12-19 NOTE — Progress Notes (Signed)
Post-Op Visit Note   Patient: [name]           Date of Birth: 1955/11/13           MRN: [id number] Visit Date: 12/19/2016 PCP: Levin Bacon, NP   Assessment & Plan: Follow-up left L5-S1 microdiscectomy. She got improvement in her leg pain. Incision looks good. She will return in 4 weeks for recheck. Robaxin renewed. She'll call if she needs more pain medication.  Chief Complaint:  Chief Complaint  Patient presents with  . Lower Back - Routine Post Op   Visit Diagnoses:  1. HNP (herniated nucleus pulposus), lumbar        Postop from left L5-S1 microdiscectomy.  Plan: continue walking, diet,avoid sitting. Gradual increased distance for daily walking. Recheck 4 weeks.  Follow-Up Instructions: Return in about 4 weeks (around 01/16/2017).   Orders:  No orders of the defined types were placed in this encounter.  Meds ordered this encounter  Medications  . methocarbamol (ROBAXIN) 500 MG tablet    Sig: Take 1 tablet (500 mg total) by mouth every 8 (eight) hours as needed for muscle spasms.    Dispense:  30 tablet    Refill:  0    Imaging: No results found.  PMFS History: Patient Active Problem List   Diagnosis Date Noted  . HNP (herniated nucleus pulposus), lumbar 12/05/2016  . 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 Hypocalcemia 08/22/2016  . Gait abnormality 05/29/2016  . Encephalopathy acute 04/23/2016  . Hyponatremia 04/23/2016  . Schizophrenia, paranoid ([place])   . Aspiration pneumonia of left lower lobe due to gastric secretions ([place])   . Projectile vomiting with nausea   . Altered mental status   . Schizophrenia ([place])   . Disorganized schizophrenia ([place])   . Chronic pain syndrome   . Acute renal failure ([place])   . Controlled diabetes mellitus type 2 with complications ([place])   . Acute encephalopathy   . Oropharyngeal dysphagia   . Aspiration pneumonia of right lower lobe due to vomit ([place])   . Acute renal failure (ARF) ([place]) 03/28/2016   . Encephalopathy 03/27/2016  . HCAP (healthcare-associated pneumonia) 02/29/2016  . Pneumonia 02/14/2016  . CAP (community acquired pneumonia) 02/13/2016  . Edema 02/13/2016  . AKI (acute kidney injury) ([place]) 02/13/2016  . Elevated troponin 06/24/2015  . Diarrhea 06/23/2015  . Sepsis ([place]) 06/23/2015  . Morbid (severe) obesity due to excess calories ([place]) 03/26/2015  . Post traumatic stress disorder (PTSD) 11/14/2014  . Leukocytosis 11/12/2014  . Slurred speech 11/12/2014  . CKD (chronic kidney disease) stage 3, GFR 30-59 ml/min (HCC)   . Diabetes mellitus, type II, insulin dependent ([place])   . TIA (transient ischemic attack)   . Hypotension 10/28/2014  . Syncope 10/28/2014  . Anemia 10/28/2014  . Type II diabetes mellitus ([place]) 10/28/2014  . OSA on CPAP 10/28/2014  . Chronic lower back pain 10/28/2014  . Polyneuropathy in diabetes(357.2) 10/28/2014  . Diabetic polyneuropathy ([place])   . Fall 08/14/2014  . Recurrent falls 08/14/2014  . Hot flashes 02/24/2014  . Arthralgia 02/24/2014  . Hair loss 02/24/2014  . Chest pain 01/06/2014  . Shock ([place]) 08/08/2013  . 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 Dysarthria 12/30/2012  . Benign paroxysmal positional vertigo 12/30/2012  . Breast cancer of upper-outer quadrant of right female breast ([place]) 11/18/2012  . Hemorrhage of rectum and anus 09/28/2012  . Hx of radiation therapy   . Syncope and collapse 06/04/2011  . HSV 10/05/2008  .  Mild persistent asthma 06/02/2008  . HLD (hyperlipidemia) 06/02/2006  . Sinus tachycardia 05/25/2006  . Hypothyroidism 02/27/2006  . Depression 02/27/2006  . Essential hypertension 02/27/2006  . GERD 02/27/2006  . KNEE PAIN 02/27/2006   Past Medical History:  Diagnosis Date  . Anemia   . Anxiety   . Arthritis    "pretty much all my joints"  . Asthma   . Benign paroxysmal positional vertigo 12/30/2012  . Breast cancer ([place]) 02/13/12   ruq  100'clock bx Ductal Carcinoma in Situ,(0/1) lymph  node neg.  . Breast mass in female    L breast 2008, US showed likely fat necrosis  . Chronic lower back pain   . CKD (chronic kidney disease), stage III ([place])    "lower stage" (01/06/2014)  . Daily headache    last time was 09/05/16 (brother passed away)  . Depression   . Dyspnea   . Gait abnormality 05/29/2016  . GERD (gastroesophageal reflux disease)   . History of blood transfusion 2011   "after one of my OR's"  . History of hiatal hernia   . History of stomach ulcers   . HSV (herpes simplex virus) infection   . Hx of radiation therapy 05/05/12- 07/15/12   right breast, 45 gray x 25 fx, lumpectomy cavity boosted to 16.2 gray  . Hyperlipemia   . Hypertension    sees Dr. Criss Rosales , [facility] [place]  . Hypothyroidism   . Kidney stones   . Knee pain, bilateral   . Migraines    "~ 3 times/month" (01/06/2014)  . Obesity   . OSA on CPAP    pt does not know settings  . Pancreatitis    hx of  . Personal history of radiation therapy 02/2012  . Pneumonia 1950's  . Polyneuropathy in diabetes(357.2)   . Schizophrenia ([place])   . 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 Tachycardia    with sx in 2008  . Thyroid disease   . Type II diabetes mellitus ([place])    dx 2014    Family History  Problem Relation Age of Onset  . Heart disease Brother        Multiple MIs, starting in his 64s  . Diabetes Brother   . Thyroid disease Brother   . Hypertension Mother   . Diabetes Mother   . Breast cancer Mother 47  . Bone cancer Mother   . Hypertension Sister   . Diabetes Sister   . Breast cancer Sister 11  . Thyroid disease Sister   . Breast cancer Maternal Grandmother   . Heart disease Maternal Grandmother   . Uterine cancer Other 19  . Breast cancer Paternal Aunt 69  . Breast cancer Paternal Grandmother        dx in her 26s  . Prostate cancer Paternal Grandfather   . Asthma Son     Past Surgical History:  Procedure Laterality Date  . ABDOMINAL HYSTERECTOMY  1979?   partial  . benign  cyst  Left    removed from left breast  . BREAST SURGERY Right 01/2012   "cancer"  . CHOLECYSTECTOMY  1980's?  [name] FOOT FRACTURE SURGERY Right 1990's  . JOINT REPLACEMENT      x 3  . REVISION TOTAL KNEE ARTHROPLASTY  07/2009  . SHOULDER OPEN ROTATOR CUFF REPAIR Left 1990's  . THYROIDECTOMY  1970's  . TOTAL KNEE ARTHROPLASTY Bilateral 2009-06/2009   left; right   Social History   Occupational History  . Occupation: Disabled  Tobacco  Use  . Smoking status: Former Smoker    Packs/day: 1.00    Years: 7.00    Pack years: 7.00    Types: Cigarettes    Last attempt to quit: 03/24/1992    Years since quitting: 24.7  . Smokeless tobacco: Never Used  Substance and Sexual Activity  . 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 Drug use: No  . Sexual activity: No    Birth control/protection: Surgical

## 2016-12-22 ENCOUNTER — Telehealth (INDEPENDENT_AMBULATORY_CARE_PROVIDER_SITE_OTHER): Payer: Self-pay | Admitting: Orthopaedic Surgery

## 2016-12-22 NOTE — Telephone Encounter (Signed)
Ok for refill? 

## 2016-12-22 NOTE — Telephone Encounter (Signed)
Rx refill Oxycodone

## 2016-12-22 NOTE — Telephone Encounter (Signed)
Can you please advise since Dr. Lorin Mercy is in surgery.

## 2016-12-23 ENCOUNTER — Encounter (INDEPENDENT_AMBULATORY_CARE_PROVIDER_SITE_OTHER): Payer: Self-pay | Admitting: Orthopaedic Surgery

## 2016-12-23 MED ORDER — HYDROCODONE-ACETAMINOPHEN 5-325 MG PO TABS: 1.0000 | ORAL_TABLET | Freq: Three times a day (TID) | ORAL | 0 refills | Status: DC | PRN

## 2016-12-23 NOTE — Telephone Encounter (Signed)
Change to Norco 5/325   # 40    One po tid prn pain. ucall thanks

## 2016-12-23 NOTE — Telephone Encounter (Signed)
Script at front. Patient advised.

## 2017-01-05 ENCOUNTER — Telehealth (INDEPENDENT_AMBULATORY_CARE_PROVIDER_SITE_OTHER): Payer: Self-pay | Admitting: Orthopaedic Surgery

## 2017-01-05 MED ORDER — METHOCARBAMOL 500 MG PO TABS: 500.0000 mg | ORAL_TABLET | Freq: Three times a day (TID) | ORAL | 0 refills | Status: DC | PRN

## 2017-01-05 NOTE — Telephone Encounter (Signed)
OK let her know this is last refill .

## 2017-01-05 NOTE — Telephone Encounter (Signed)
[place] for refill?

## 2017-01-05 NOTE — Telephone Encounter (Signed)
Fyi.  I called patient and advised. Script sent to pharmacy. Patient states that the muscle cramps have gotten much worse in her legs. She has continued pain, but it has moved up her back some.

## 2017-01-05 NOTE — Telephone Encounter (Signed)
Rx refill Methocarbamol

## 2017-01-06 ENCOUNTER — Telehealth (INDEPENDENT_AMBULATORY_CARE_PROVIDER_SITE_OTHER): Payer: Self-pay | Admitting: Orthopaedic Surgery

## 2017-01-06 MED ORDER — HYDROCODONE-ACETAMINOPHEN 5-325 MG PO TABS: 1.0000 | ORAL_TABLET | Freq: Three times a day (TID) | ORAL | 0 refills | Status: DC | PRN

## 2017-01-06 NOTE — Addendum Note (Signed)
Addended by: Meyer Cory on: 01/06/2017 11:12 AM   Modules accepted: Orders

## 2017-01-06 NOTE — Telephone Encounter (Signed)
Script printed. I called patient and advised, Dr. Lorin Mercy is in surgery and will sign once he gets back to clinic this afternoon. Script should be at front desk for pick up by 130.

## 2017-01-06 NOTE — Telephone Encounter (Signed)
Patient called asking for a refill on hydrocodone. CB # [phone]

## 2017-01-06 NOTE — Telephone Encounter (Signed)
Please advise.

## 2017-01-06 NOTE — Telephone Encounter (Signed)
Ok thanks 

## 2017-01-12 ENCOUNTER — Telehealth (INDEPENDENT_AMBULATORY_CARE_PROVIDER_SITE_OTHER): Payer: Self-pay | Admitting: Orthopaedic Surgery

## 2017-01-12 NOTE — Telephone Encounter (Signed)
IC patient, she says that she is having pain from above the surgical site, down to the rectum/coccyx, hurts very badly.  Onset x 2 days, no injury. I offered a workin tomorrow, and she declines, says she cannot come until her appt on Friday when her daughter is off and can bring her. I have advised her to use ice/heat and the robaxin. She is NOT having loss of bowel or bladder control.  (she normally has some bladder issues, but nothing new)  I told her I would send to you as FYI, see if you advise anything further.

## 2017-01-12 NOTE — Telephone Encounter (Signed)
Patient's states she has been in severe pain for a few days and request a call back with options.

## 2017-01-13 NOTE — Telephone Encounter (Signed)
Noted.

## 2017-01-16 ENCOUNTER — Ambulatory Visit (INDEPENDENT_AMBULATORY_CARE_PROVIDER_SITE_OTHER): Payer: Medicare Other | Admitting: Orthopaedic Surgery

## 2017-01-19 ENCOUNTER — Other Ambulatory Visit: Payer: Self-pay | Admitting: Endocrinology

## 2017-01-20 ENCOUNTER — Telehealth (INDEPENDENT_AMBULATORY_CARE_PROVIDER_SITE_OTHER): Payer: Self-pay | Admitting: Orthopaedic Surgery

## 2017-01-20 ENCOUNTER — Other Ambulatory Visit: Payer: Self-pay

## 2017-01-20 MED ORDER — INSULIN LISPRO 100 UNIT/ML (KWIKPEN): 10.0000 [IU] | PEN_INJECTOR | Freq: Three times a day (TID) | SUBCUTANEOUS | 4 refills | Status: DC

## 2017-01-20 MED ORDER — HYDROCODONE-ACETAMINOPHEN 5-325 MG PO TABS: 1.0000 | ORAL_TABLET | Freq: Three times a day (TID) | ORAL | 0 refills | Status: DC | PRN

## 2017-01-20 MED ORDER — INSULIN DETEMIR 100 UNIT/ML FLEXPEN: 10.0000 [IU] | PEN_INJECTOR | Freq: Every day | SUBCUTANEOUS | 4 refills | Status: DC

## 2017-01-20 MED ORDER — METHOCARBAMOL 500 MG PO TABS: 500.0000 mg | ORAL_TABLET | Freq: Three times a day (TID) | ORAL | 0 refills | Status: DC | PRN

## 2017-01-20 NOTE — Telephone Encounter (Signed)
[place] for refill? Pt has follow up appt 12/11

## 2017-01-20 NOTE — Telephone Encounter (Signed)
Patient called needing Rx refilled (Hydrocodone) and also (Methocarbamol) Patient advised she has been in a lot of pain for the last three days. Patient advised she will be here for her appointment. The number to contact patient is [phone]

## 2017-01-20 NOTE — Telephone Encounter (Signed)
Entered. I called patient and advised script ready for pick up.

## 2017-01-20 NOTE — Telephone Encounter (Signed)
Ok refill both thx

## 2017-01-27 ENCOUNTER — Ambulatory Visit (INDEPENDENT_AMBULATORY_CARE_PROVIDER_SITE_OTHER): Payer: Medicare Other | Admitting: Orthopaedic Surgery

## 2017-01-30 ENCOUNTER — Ambulatory Visit (INDEPENDENT_AMBULATORY_CARE_PROVIDER_SITE_OTHER): Payer: Medicare Other | Admitting: Orthopaedic Surgery

## 2017-02-02 ENCOUNTER — Telehealth (INDEPENDENT_AMBULATORY_CARE_PROVIDER_SITE_OTHER): Payer: Self-pay | Admitting: Orthopaedic Surgery

## 2017-02-02 ENCOUNTER — Telehealth (INDEPENDENT_AMBULATORY_CARE_PROVIDER_SITE_OTHER): Payer: Self-pay

## 2017-02-02 NOTE — Telephone Encounter (Signed)
Hands cold, right side of her body gets numb off and on. Has Neurologist Dr. Jannifer Franklin. Wearing gloves this AM helped some . Warmed her hands, right side body gets numb 2 X per day from shoulder down to right leg and foot. She will call  Her neurologist. Juluis Rainier

## 2017-02-02 NOTE — Telephone Encounter (Signed)
Patient called reported that the numbness in her hand is getting much worse. She said that now she is experiencing numbness and tingling in both hands that goes down into feet and legs.  She said that her hands are cold and she cannot feel anything. I advised that if she felt she could not wait needed to go to the ER to be evaluated as Dr Lorin Mercy was not in office today. She asked for a call back to discuss.  [phone]

## 2017-02-02 NOTE — Telephone Encounter (Signed)
Please see below and advise Patient also requests refill of hydrocodone. She has follow up appt scheduled with Jeneen Rinks on 02/05/2017.

## 2017-02-02 NOTE — Telephone Encounter (Signed)
Patient called needing Rx refilled Hydrocodone. Patient advised she has numbness in her right hand and both hands are ice cold. Patient advised she also have numbness in her toes on her right foot. Transferred patient to triage. The number to contact patient is [phone]

## 2017-02-02 NOTE — Telephone Encounter (Signed)
noted 

## 2017-02-02 NOTE — Telephone Encounter (Signed)
Duplicate message. Message from triage sent to Dr. Lorin Mercy to review.

## 2017-02-05 ENCOUNTER — Encounter (INDEPENDENT_AMBULATORY_CARE_PROVIDER_SITE_OTHER): Payer: Self-pay | Admitting: Surgery

## 2017-02-05 ENCOUNTER — Ambulatory Visit (INDEPENDENT_AMBULATORY_CARE_PROVIDER_SITE_OTHER): Payer: Medicare Other | Admitting: Surgery

## 2017-02-05 DIAGNOSIS — Z9119 Patient's noncompliance with other medical treatment and regimen: Secondary | ICD-10-CM

## 2017-02-05 DIAGNOSIS — M5416 Radiculopathy, lumbar region: Secondary | ICD-10-CM

## 2017-02-05 DIAGNOSIS — Z91199 Patient's noncompliance with other medical treatment and regimen due to unspecified reason: Secondary | ICD-10-CM

## 2017-02-05 NOTE — Progress Notes (Signed)
Post-Op Visit Note   Patient: [name]           Date of Birth: 05-15-1955           MRN: [id number] Visit Date: 02/05/2017 PCP: Levin Bacon, NP   Assessment & Plan:  Chief Complaint: No chief complaint on file. Patient comes in today with complaints of ongoing low back pain and bilateral lower extremity radiculopathy. Status post L5-S1 microdiscectomy Dr. Lorin Mercy about 2 months ago. States that her pain is just as bad as preop. Patient also complains of ongoing chronic pelvic and rectal pain. States that she does not currently have an OB/GYN physician in these to get a new one. She also has not discussed ongoing issues with her primary care physician. She complains of ongoing numbness in her hands and has seen neurologist Dr. Jannifer Franklin for this in the past. She has not contacted him regarding these worsening issues. Visit Diagnoses:  1. Radiculopathy, lumbar region     Plan: Since patient states that she continues to have ongoing worsening pain with lumbar spine I will schedule MRI with contrast to rule out recurrent HNP/stenosis. I was planning on sending patient for this study today but she stated that she could not go because of transportation issues. I advised that with the multiple complaints that she has including pelvic, rectal, bilateral upper extremity neuropathy that she could go to the emergency room for further evaluation. Patient was also told to contact her neurologist, PCP and she should also get established with another OB/GYN.  Follow-Up Instructions: No Follow-up on file.   Orders:  Orders Placed This Encounter  Procedures  . MR LUMBAR SPINE W CONTRAST   No orders of the defined types were placed in this encounter.   Imaging: No results found.  PMFS History: Patient Active Problem List   Diagnosis Date Noted  . HNP (herniated nucleus pulposus), lumbar 12/05/2016  . 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 Hypocalcemia 08/22/2016  . Gait  abnormality 05/29/2016  . Encephalopathy acute 04/23/2016  . Hyponatremia 04/23/2016  . Schizophrenia, paranoid ([place])   . Aspiration pneumonia of left lower lobe due to gastric secretions ([place])   . Projectile vomiting with nausea   . Altered mental status   . Schizophrenia ([place])   . Disorganized schizophrenia ([place])   . Chronic pain syndrome   . Acute renal failure ([place])   . Controlled diabetes mellitus type 2 with complications ([place])   . Acute encephalopathy   . Oropharyngeal dysphagia   . Aspiration pneumonia of right lower lobe due to vomit ([place])   . Acute renal failure (ARF) ([place]) 03/28/2016  . Encephalopathy 03/27/2016  . HCAP (healthcare-associated pneumonia) 02/29/2016  . Pneumonia 02/14/2016  . CAP (community acquired pneumonia) 02/13/2016  . Edema 02/13/2016  . AKI (acute kidney injury) ([place]) 02/13/2016  . Elevated troponin 06/24/2015  . Diarrhea 06/23/2015  . Sepsis ([place]) 06/23/2015  . Morbid (severe) obesity due to excess calories ([place]) 03/26/2015  . Post traumatic stress disorder (PTSD) 11/14/2014  . Leukocytosis 11/12/2014  . Slurred speech 11/12/2014  . CKD (chronic kidney disease) stage 3, GFR 30-59 ml/min (HCC)   . Diabetes mellitus, type II, insulin dependent ([place])   . TIA (transient ischemic attack)   . Hypotension 10/28/2014  . Syncope 10/28/2014  . Anemia 10/28/2014  . Type II diabetes mellitus ([place]) 10/28/2014  . OSA on CPAP 10/28/2014  . Chronic lower back pain 10/28/2014  . Polyneuropathy in diabetes(357.2) 10/28/2014  . Diabetic  polyneuropathy ([place])   . Fall 08/14/2014  . Recurrent falls 08/14/2014  . Hot flashes 02/24/2014  . Arthralgia 02/24/2014  . Hair loss 02/24/2014  . Chest pain 01/06/2014  . Shock ([place]) 08/08/2013  . 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 Dysarthria 12/30/2012  . Benign paroxysmal positional vertigo 12/30/2012  . Breast cancer of upper-outer quadrant of right female breast ([place]) 11/18/2012  . Hemorrhage of rectum  and anus 09/28/2012  . Hx of radiation therapy   . Syncope and collapse 06/04/2011  . HSV 10/05/2008  . Mild persistent asthma 06/02/2008  . HLD (hyperlipidemia) 06/02/2006  . Sinus tachycardia 05/25/2006  . Hypothyroidism 02/27/2006  . Depression 02/27/2006  . Essential hypertension 02/27/2006  . GERD 02/27/2006  . KNEE PAIN 02/27/2006   Past Medical History:  Diagnosis Date  . Anemia   . Anxiety   . Arthritis    "pretty much all my joints"  . Asthma   . Benign paroxysmal positional vertigo 12/30/2012  . Breast cancer ([place]) 02/13/12   ruq  100'clock bx Ductal Carcinoma in Situ,(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  . Breast mass in female    L breast 2008, US showed likely fat necrosis  . Chronic lower back pain   . CKD (chronic kidney disease), stage III ([place])    "lower stage" (01/06/2014)  . Daily headache    last time was 08-31-2016 (brother passed away)  . Depression   . Dyspnea   . Gait abnormality 05/29/2016  . GERD (gastroesophageal reflux disease)   . History of blood transfusion 2011   "after one of my OR's"  . History of hiatal hernia   . History of stomach ulcers   . HSV (herpes simplex virus) infection   . Hx of radiation therapy 05/05/12- 07/15/12   right breast, 45 gray x 25 fx, lumpectomy cavity boosted to 16.2 gray  . Hyperlipemia   . Hypertension    sees Dr. Criss Rosales , [facility] [place]  . Hypothyroidism   . Kidney stones   . Knee pain, bilateral   . Migraines    "~ 3 times/month" (01/06/2014)  . Obesity   . OSA on CPAP    pt does not know settings  . Pancreatitis    hx of  . Personal history of radiation therapy 02/2012  . Pneumonia 1950's  . Polyneuropathy in diabetes(357.2)   . Schizophrenia ([place])   . 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 Tachycardia    with sx in 2008  . Thyroid disease   . Type II diabetes mellitus ([place])    dx 2014    Family History  Problem Relation Age of Onset  . Heart disease Brother        Multiple MIs, starting in his 29s  .  Diabetes Brother   . Thyroid disease Brother   . Hypertension Mother   . Diabetes Mother   . Breast cancer Mother 59  . Bone cancer Mother   . Hypertension Sister   . Diabetes Sister   . Breast cancer Sister 73  . Thyroid disease Sister   . Breast cancer Maternal Grandmother   . Heart disease Maternal Grandmother   . Uterine cancer Other 19  . Breast cancer Paternal Aunt 17  . Breast cancer Paternal Grandmother        dx in her 67s  . Prostate cancer Paternal Grandfather   . Asthma Son     Past Surgical History:  Procedure Laterality Date  . ABDOMINAL HYSTERECTOMY  1979?  partial  . benign cyst  Left    removed from left breast  . BREAST LUMPECTOMY  03/03/2012   Procedure: LUMPECTOMY;  Surgeon: Haywood Lasso, MD;  Location: [facility];  Service: General;  Laterality: Right;  . BREAST SURGERY Right 01/2012   "cancer"  . CHOLECYSTECTOMY  1980's?  . COLONOSCOPY WITH PROPOFOL N/A 09/28/2012   Procedure: COLONOSCOPY WITH PROPOFOL;  Surgeon: Lear Ng, MD;  Location: [facility];  Service: Endoscopy;  Laterality: N/A;  . FOOT FRACTURE SURGERY Right 1990's  . JOINT REPLACEMENT      x 3  . LUMBAR LAMINECTOMY/DECOMPRESSION MICRODISCECTOMY N/A 12/05/2016   Procedure: LEFT L5-S1 MICRODISCECTOMY;  Surgeon: Marybelle Killings, MD;  Location: [facility];  Service: Orthopedics;  Laterality: N/A;  . REVISION TOTAL KNEE ARTHROPLASTY  07/2009  . SHOULDER OPEN ROTATOR CUFF REPAIR Left 1990's  . THYROIDECTOMY  1970's  . TOTAL KNEE ARTHROPLASTY Bilateral 2009-06/2009   left; right   Social History   Occupational History  . Occupation: Disabled  Tobacco Use  . Smoking status: Former Smoker    Packs/day: 1.00    Years: 7.00    Pack years: 7.00    Types: Cigarettes    Last attempt to quit: 03/24/1992    Years since quitting: 24.8  . Smokeless tobacco: Never Used  Substance and Sexual Activity  . 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 Drug use: No    . Sexual activity: No    Birth control/protection: Surgical

## 2017-02-06 ENCOUNTER — Telehealth: Payer: Self-pay | Admitting: Endocrinology

## 2017-02-06 ENCOUNTER — Other Ambulatory Visit: Payer: Self-pay

## 2017-02-06 MED ORDER — JANUMET 50-500 MG PO TABS: 1.0000 | ORAL_TABLET | Freq: Two times a day (BID) | ORAL | 1 refills | Status: DC

## 2017-02-06 NOTE — Telephone Encounter (Signed)
Pt needs refills on janumet to the [facility]

## 2017-02-06 NOTE — Telephone Encounter (Signed)
Done.

## 2017-02-13 ENCOUNTER — Telehealth (INDEPENDENT_AMBULATORY_CARE_PROVIDER_SITE_OTHER): Payer: Self-pay | Admitting: Orthopaedic Surgery

## 2017-02-13 MED ORDER — METHOCARBAMOL 500 MG PO TABS: 500.0000 mg | ORAL_TABLET | Freq: Three times a day (TID) | ORAL | 0 refills | Status: DC | PRN

## 2017-02-13 NOTE — Telephone Encounter (Signed)
Yes #30

## 2017-02-13 NOTE — Telephone Encounter (Signed)
Can you advise since Dr. Lorin Mercy is out of the office?

## 2017-02-13 NOTE — Telephone Encounter (Signed)
Med refill  Methocarbamol-Robaxin

## 2017-02-13 NOTE — Telephone Encounter (Signed)
Sent to pharmacy. Patient advised.

## 2017-03-03 ENCOUNTER — Ambulatory Visit (INDEPENDENT_AMBULATORY_CARE_PROVIDER_SITE_OTHER): Payer: Medicare Other | Admitting: Orthopaedic Surgery

## 2017-04-02 ENCOUNTER — Telehealth (INDEPENDENT_AMBULATORY_CARE_PROVIDER_SITE_OTHER): Payer: Self-pay | Admitting: Orthopaedic Surgery

## 2017-04-02 NOTE — Telephone Encounter (Signed)
Patient called to see if Dr. Lorin Mercy still wanted her to get an MRI. It looks like [name] put in the order. She has new insurance that started 03/20/17. I entered the information she gave me over the phone. Can we send for authorization so she can get her MRI done? Please advise # [phone]

## 2017-04-02 NOTE — Telephone Encounter (Signed)
MRI Lumbar Spine with and without was ordered in December for the patient. She has new Environmental consultant. Can you check on authorization to get MRI ordered and completed? Thanks.

## 2017-04-03 NOTE — Telephone Encounter (Signed)
I called [facility] and sw Judson Roch and she will contact pt to schedule appt, per pt insurance no precert is required

## 2017-04-10 ENCOUNTER — Other Ambulatory Visit: Payer: Self-pay | Admitting: *Deleted

## 2017-04-10 ENCOUNTER — Other Ambulatory Visit: Payer: Self-pay

## 2017-04-10 DIAGNOSIS — E119 Type 2 diabetes mellitus without complications: Secondary | ICD-10-CM

## 2017-04-10 DIAGNOSIS — Z794 Long term (current) use of insulin: Principal | ICD-10-CM

## 2017-04-10 MED ORDER — GLUCOSE BLOOD VI STRP: ORAL_STRIP | 12 refills | Status: DC

## 2017-04-17 ENCOUNTER — Ambulatory Visit
Admission: RE | Admit: 2017-04-17 | Discharge: 2017-04-17 | Disposition: A | Discharge status: Home / Self Care | Payer: Medicare Other | Source: Ambulatory Visit | Attending: Surgery | Admitting: Surgery

## 2017-04-17 DIAGNOSIS — M5416 Radiculopathy, lumbar region: Secondary | ICD-10-CM

## 2017-04-17 MED ORDER — GADOBENATE DIMEGLUMINE 529 MG/ML IV SOLN
20.0000 mL | Freq: Once | INTRAVENOUS | Status: AC | PRN
  Administered 2017-04-17: 20 mL via INTRAVENOUS

## 2017-04-28 ENCOUNTER — Encounter (INDEPENDENT_AMBULATORY_CARE_PROVIDER_SITE_OTHER): Payer: Self-pay | Admitting: Orthopaedic Surgery

## 2017-04-28 ENCOUNTER — Ambulatory Visit (INDEPENDENT_AMBULATORY_CARE_PROVIDER_SITE_OTHER): Payer: Medicare Other | Admitting: Orthopaedic Surgery

## 2017-04-28 VITALS — BP 185/97 | HR 109 | Ht 65.0 in | Wt 350.0 lb

## 2017-04-28 DIAGNOSIS — M5116 Intervertebral disc disorders with radiculopathy, lumbar region: Secondary | ICD-10-CM | POA: Diagnosis not present

## 2017-04-28 NOTE — Progress Notes (Signed)
Office Visit Note   Patient: [name]           Date of Birth: 1955/02/24           MRN: [id number] Visit Date: 04/28/2017              Requested by: Levin Bacon, NP [address], [place] [address] PCP: Levin Bacon, NP   Assessment & Plan: Visit Diagnoses:  1. Lumbar disc herniation with radiculopathy     Plan: Patient's MRI scan shows some progression at the L3-4 level which did not have surgery with some stenosis of the canal multifactorial.  She has disc bulge at L4-5 with some moderate narrowing and has recurrent disc protrusion which looks as prominent as the previous MRI at the time of surgery or may be even a little bit larger.  At the time of surgery this disc material was decompressed and she has extruded some more fragments.  She is now having more pain on the right than the left leg.  We discussed with her multilevel involvement involving the bottom 3 levels.  She does have recurrence at the operative level but also has progression at the L3-4 level.  I recommend she look into some water aerobics.  Patient states she really does not know how to swim but would be okay if she stayed in water that was only up to shoulder level.  She can talk to her pain management about repeating an epidural injection.  I discussed with her there are not any good surgical options due to her multilevel involvement.  She can call Dr. Megan Salon about a lumbar epidural to see if this gives her some relief.  Follow-Up Instructions: No Follow-up on file.   Orders:  No orders of the defined types were placed in this encounter.  No orders of the defined types were placed in this encounter.     Procedures: No procedures performed   Clinical Data: No additional findings.   Subjective: Chief Complaint  Patient presents with  . Lower Back - Pain, Follow-up    MRI review    HPI 62-year-old female post left L5-S1 microdiscectomy on 10/19 /2018 who is followed by Dr.  Megan Salon in pain management at [facility] .  Patient states she has pain severe when she walks she used to get some relief in supine position.  She states now laying down she still is having pain.  She is on oxycodone 10 mg 4 a day for pain management.  New MRI scan is available for review.  She has back pain that rates as severe and goes from the thoracolumbar junction down to the sacrum.  Preop she had left leg symptoms now she is having primarily right leg symptoms with cramping type pain.  Review of Systems updated and unchanged from surgery in the fall 2018 for microdiscectomy other than as mentioned in HPI.   Objective: Vital Signs: BP (!) 185/97   Pulse (!) 109   Ht _0  (1.651 m)   Wt (!) 350 lb (158.8 kg)   BMI 58.24 kg/m   Physical Exam  Constitutional: She is oriented to person, place, and time. She appears well-developed.  HENT:  Head: Normocephalic.  Right Ear: External ear normal.  Left Ear: External ear normal.  Eyes: Pupils are equal, round, and reactive to light.  Neck: No tracheal deviation present. No thyromegaly present.  Cardiovascular: Normal rate.  Pulmonary/Chest: Effort normal.  Abdominal: Soft.  Neurological: She is alert and oriented to person, place, and time.  Skin: Skin is warm and dry.  Psychiatric: She has a normal mood and affect. Her behavior is normal.    Ortho Exam patient has difficulty getting from sitting to standing she grunts with motion.  Lumbar incisions well-healed no erythema.  Patient has back pain with hip logroll test internal and external rotation which is 30 degrees internal and external without limitation.  Knees reach full extension.  Specialty Comments:  No specialty comments available.  Imaging: CLINICAL DATA:  Chronic diffuse back pain for years. Pain radiates to both legs. Back surgery in 2018 without relief.  Creatinine was obtained on site at [facility] at [address]  Results: Creatinine 1.2 mg/dL.  EXAM: MRI LUMBAR SPINE WITHOUT AND WITH CONTRAST  TECHNIQUE: Multiplanar and multiecho pulse sequences of the lumbar spine were obtained without and with intravenous contrast.  CONTRAST:  37m MULTIHANCE GADOBENATE DIMEGLUMINE 529 MG/ML IV SOLN  COMPARISON:  Radiographs 12/05/2016, abdominal CT 04/02/2016 and lumbar MRI 03/01/2016.  FINDINGS: Segmentation: Conventional anatomy assumed, with the last open disc space designated L5-S1.  Alignment: Mild convex-right scoliosis. Stable compared with previous study.  Vertebrae: No worrisome osseous lesion, acute fracture or pars defect. There is multilevel facet disease. The lumbar pedicles are somewhat short on a congenital basis. The visualized sacroiliac joints appear unremarkable.  Conus medullaris: Extends to the T12-L1 level and appears normal. No abnormal intradural enhancement, although possible mild clumping of the nerve roots in the distal thecal sac.  Paraspinal and other soft tissues: No significant paraspinal findings.  Disc levels:  No significant disc space findings from T11-12 through L1-2.  L2-3: Annular disc bulging mildly eccentric to the right with mild facet and ligamentous hypertrophy. No significant spinal stenosis or nerve root encroachment.  L3-4: Progressive annular disc bulging, facet and ligamentous hypertrophy resulting in severe spinal stenosis. There is moderate lateral recess and foraminal narrowing bilaterally which has progressed.  L4-5: Mildly progressive annular disc bulging with advanced facet and ligamentous hypertrophy. The resulting moderate multifactorial spinal stenosis and asymmetric narrowing of the left lateral recess have not significantly changed. There is left-greater-than-right foraminal narrowing which appears slightly worse.  L5-S1: Interval left laminectomy and discectomy. Left paracentral disc protrusion has  mildly enlarged with resulting mild mass effect on the thecal sac and mild posterior displacement of the left S1 nerve root. There is some enhancement surrounding the S1 nerve roots. Bilateral facet hypertrophy and mild left-greater-than-right foraminal narrowing appears unchanged.  IMPRESSION: 1. Interval surgery on the left at L5-S1. Left paracentral disc protrusion has mildly enlarged with mild posterior displacement of the left S1 nerve root. 2. Mildly progressive left-greater-than-right foraminal narrowing at L4-5 secondary to annular disc bulging, facet and ligamentous hypertrophy. The associated moderate multifactorial spinal stenosis at that level is unchanged. 3. Progressive multifactorial spinal stenosis at L3-4, now severe, with progressive narrowing of the lateral recesses and foramina bilaterally. Possible bilateral L3 nerve root encroachment.   Electronically Signed   By: WRichardean SaleM.D.   On: 04/17/2017 14:02    PMFS History: Patient Active Problem List   Diagnosis Date Noted  . HNP (herniated nucleus pulposus), lumbar 12/05/2016  . 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 Hypocalcemia 08/22/2016  . Gait abnormality 05/29/2016  . Encephalopathy acute 04/23/2016  . Hyponatremia 04/23/2016  . Schizophrenia, paranoid ([place]   . Aspiration pneumonia of left lower lobe due to gastric secretions ([place]   . Projectile vomiting  with nausea   . Altered mental status   . Schizophrenia ([place])   . Disorganized schizophrenia ([place])   . Chronic pain syndrome   . Acute renal failure ([place])   . Controlled diabetes mellitus type 2 with complications ([place])   . Acute encephalopathy   . Oropharyngeal dysphagia   . Aspiration pneumonia of right lower lobe due to vomit ([place])   . Acute renal failure (ARF) ([place]) 03/28/2016  . Encephalopathy 03/27/2016  . HCAP (healthcare-associated pneumonia) 02/29/2016  . Pneumonia 02/14/2016  . CAP (community acquired  pneumonia) 02/13/2016  . Edema 02/13/2016  . AKI (acute kidney injury) ([place]) 02/13/2016  . Elevated troponin 06/24/2015  . Diarrhea 06/23/2015  . Sepsis ([place]) 06/23/2015  . Morbid (severe) obesity due to excess calories ([place]) 03/26/2015  . Post traumatic stress disorder (PTSD) 11/14/2014  . Leukocytosis 11/12/2014  . Slurred speech 11/12/2014  . CKD (chronic kidney disease) stage 3, GFR 30-59 ml/min (HCC)   . Diabetes mellitus, type II, insulin dependent ([place])   . TIA (transient ischemic attack)   . Hypotension 10/28/2014  . Syncope 10/28/2014  . Anemia 10/28/2014  . Type II diabetes mellitus ([place]) 10/28/2014  . OSA on CPAP 10/28/2014  . Chronic lower back pain 10/28/2014  . Polyneuropathy in diabetes(357.2) 10/28/2014  . Diabetic polyneuropathy ([place])   . Fall 08/14/2014  . Recurrent falls 08/14/2014  . Hot flashes 02/24/2014  . Arthralgia 02/24/2014  . Hair loss 02/24/2014  . Chest pain 01/06/2014  . Shock ([place]) 08/08/2013  . 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 Dysarthria 12/30/2012  . Benign paroxysmal positional vertigo 12/30/2012  . Breast cancer of upper-outer quadrant of right female breast ([place]) 11/18/2012  . Hemorrhage of rectum and anus 09/28/2012  . Hx of radiation therapy   . Syncope and collapse 06/04/2011  . HSV 10/05/2008  . Mild persistent asthma 06/02/2008  . HLD (hyperlipidemia) 06/02/2006  . Sinus tachycardia 05/25/2006  . Hypothyroidism 02/27/2006  . Depression 02/27/2006  . Essential hypertension 02/27/2006  . GERD 02/27/2006  . KNEE PAIN 02/27/2006   Past Medical History:  Diagnosis Date  . Anemia   . Anxiety   . Arthritis    "pretty much all my joints"  . Asthma   . Benign paroxysmal positional vertigo 12/30/2012  . Breast cancer ([place]) 02/13/12   ruq  100'clock bx Ductal Carcinoma in Situ,(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  . Breast mass in female    L breast 2008, US showed likely fat necrosis  . Chronic lower back pain   . CKD (chronic  kidney disease), stage III ([place])    "lower stage" (01/06/2014)  . Daily headache    last time was Sep 07, 2016 (brother passed away)  . Depression   . Dyspnea   . Gait abnormality 05/29/2016  . GERD (gastroesophageal reflux disease)   . History of blood transfusion 2011   "after one of my OR's"  . History of hiatal hernia   . History of stomach ulcers   . HSV (herpes simplex virus) infection   . Hx of radiation therapy 05/05/12- 07/15/12   right breast, 45 gray x 25 fx, lumpectomy cavity boosted to 16.2 gray  . Hyperlipemia   . Hypertension    sees Dr. Criss Rosales , [facility]  . Hypothyroidism   . Kidney stones   . Knee pain, bilateral   . Migraines    "~ 3 times/month" (01/06/2014)  . Obesity   . OSA on CPAP    pt does not know settings  .  Pancreatitis    hx of  . Personal history of radiation therapy 02/2012  . Pneumonia 1950's  . Polyneuropathy in diabetes(357.2)   . Schizophrenia ([place])   . 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 Tachycardia    with sx in 2008  . Thyroid disease   . Type II diabetes mellitus ([place])    dx 2014    Family History  Problem Relation Age of Onset  . Heart disease Brother        Multiple MIs, starting in his 40s  . Diabetes Brother   . Thyroid disease Brother   . Hypertension Mother   . Diabetes Mother   . Breast cancer Mother 10  . Bone cancer Mother   . Hypertension Sister   . Diabetes Sister   . Breast cancer Sister 66  . Thyroid disease Sister   . Breast cancer Maternal Grandmother   . Heart disease Maternal Grandmother   . Uterine cancer Other 19  . Breast cancer Paternal Aunt 34  . Breast cancer Paternal Grandmother        dx in her 23s  . Prostate cancer Paternal Grandfather   . Asthma Son     Past Surgical History:  Procedure Laterality Date  . ABDOMINAL HYSTERECTOMY  1979?   partial  . benign cyst  Left    removed from left breast  . BREAST LUMPECTOMY  03/03/2012   Procedure: LUMPECTOMY;  Surgeon: Haywood Lasso,  MD;  Location: [facility];  Service: General;  Laterality: Right;  . BREAST SURGERY Right 01/2012   "cancer"  . CHOLECYSTECTOMY  1980's?  . COLONOSCOPY WITH PROPOFOL N/A 09/28/2012   Procedure: COLONOSCOPY WITH PROPOFOL;  Surgeon: Lear Ng, MD;  Location: [facility];  Service: Endoscopy;  Laterality: N/A;  . FOOT FRACTURE SURGERY Right 1990's  . JOINT REPLACEMENT      x 3  . LUMBAR LAMINECTOMY/DECOMPRESSION MICRODISCECTOMY N/A 12/05/2016   Procedure: LEFT L5-S1 MICRODISCECTOMY;  Surgeon: Marybelle Killings, MD;  Location: [facility];  Service: Orthopedics;  Laterality: N/A;  . REVISION TOTAL KNEE ARTHROPLASTY  07/2009  . SHOULDER OPEN ROTATOR CUFF REPAIR Left 1990's  . THYROIDECTOMY  1970's  . TOTAL KNEE ARTHROPLASTY Bilateral 2009-06/2009   left; right   Social History   Occupational History  . Occupation: Disabled  Tobacco Use  . Smoking status: Former Smoker    Packs/day: 1.00    Years: 7.00    Pack years: 7.00    Types: Cigarettes    Last attempt to quit: 03/24/1992    Years since quitting: 25.1  . Smokeless tobacco: Never Used  Substance and Sexual Activity  . 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 Drug use: No  . Sexual activity: No    Birth control/protection: Surgical

## 2017-05-03 ENCOUNTER — Encounter: Payer: Self-pay | Admitting: Neurology

## 2017-05-04 ENCOUNTER — Encounter: Payer: Self-pay | Admitting: Neurology

## 2017-05-04 ENCOUNTER — Ambulatory Visit (INDEPENDENT_AMBULATORY_CARE_PROVIDER_SITE_OTHER): Payer: Medicare Other | Admitting: Neurology

## 2017-05-04 VITALS — BP 166/85 | HR 112 | Ht 65.0 in | Wt 348.0 lb

## 2017-05-04 DIAGNOSIS — Z9989 Dependence on other enabling machines and devices: Secondary | ICD-10-CM | POA: Diagnosis not present

## 2017-05-04 DIAGNOSIS — G4733 Obstructive sleep apnea (adult) (pediatric): Secondary | ICD-10-CM

## 2017-05-04 NOTE — Progress Notes (Signed)
Subjective:    Patient ID: [name] is a 62 y.o. female.  HPI     Interim history:   [name] is a 62 year old right-handed woman with a complex medical history of breast cancer, chronic low back pain, mood disorder, reflux disease, anemia, arthritis, hyperlipidemia, hypertension, hypothyroidism, kidney stones, migraine headaches, history of pancreatitis, diabetes and morbid obesity with a BMI of over 50, who presents for follow-up consultation of her obstructive sleep apnea, on treatment with CPAP. The patient is unaccompanied today. I last saw her on 09/30/2016, at which time she was suboptimal with her CPAP compliance. She was advised to be fully compliant with treatment and follow-up in one month for compliance recheck. She did report sleeping better with CPAP.  She was seen in the interim by Ward Givens on 11/03/2016, at which time she was compliant with CPAP therapy and advised to routinely follow-up in 6 months.  Today, 05/04/2017: I reviewed her CPAP compliance data from 04/04/2017 through 05/03/2017 which is a total of 30 days, during which time she used her CPAP 19 days with percent used days greater than 4 hours at 63%, indicating suboptimal compliance with an average usage of 7 hours and 52 minutes, residual AHI suboptimal at 7.2 per hour, leak on the high side with the 95th percentile at 39.8 L/m on a pressure of 12 cm TR of 2. In the past 90 days her compliance percentage for more than 4 hours was 69%, post adequate. Leak has been consistently on the high side.  She reports using her CPAP most nights but some nights she has felt much pain she hardly sleeps. She is requesting supplies and has been trying to get in touch with her DME company but has not heard back. She has not tried a chinstrap yet and would be willing to try it.  The patient's allergies, current medications, family history, past medical history, past social history, past surgical history and problem list  were reviewed and updated as appropriate.    Previously (copied from previous notes for reference):   I first met her on 06/30/2016 at the request of Dr. Jannifer Franklin, at which time she reported a prior diagnosis of obstructive sleep apnea and is using CPAP. She had an older machine. She required reevaluation as it had been possibly 20 years since her original sleep study. I advised her to proceed with sleep study testing. She had a split-night sleep study on 07/11/2016. I went over her test results with her in detail today. Baseline sleep efficiency was 79.5%, REM sleep was absent. She had a highly increased percentage of stage II sleep, absence of slow-wave sleep and REM sleep. Total AHI was 46.1 per hour, average oxygen saturation of only 89%, nadir in non-REM sleep was 81%. She had no significant PLMS during the first part of the study. She was then fitted with nasal pillows. She was titrated on CPAP from 5 cm to 10 cm. On the final pressure her AHI was suboptimal at 10.9 per hour, supine sleep achieved, O2 nadir of 92%. She had no significant PLMS. Based on her test results I prescribed CPAP therapy for home use a pressure of 11 cm as she was not fully treated on 10 cm.   I reviewed her CPAP compliance data from 07/25/2016 through 09/28/2016 which is a total of 66 days, during which time she used her CPAP 48 days with percent used days greater than 4 hours at 64%, indicating suboptimal compliance with an average usage  of 6 hours and 44 minutes, residual AHI suboptimal at 13.4 per hour, leak on the high side with the 95th percentile at 35.4 L/m on a pressure of 11 cm with EPR of 2. In the past 30 days, she only used her machine 22 days, percent used days greater than 4 hours again suboptimal at 67%, average usage of 7 hours and 30 minutes, residual AHI 16.3 per hour, leak high with the 95th percentile at 42.8 L/m.    06/30/2016: (She) was previously diagnosed with obstructive sleep apnea and placed on CPAP  therapy. Her sleep study may have been as long as 20 years ago, her current machine is at least 61 years old. Prior  sleep study results are not available for my review today. A CPAP compliance download is not available for my review today. I reviewed your office note from 05/29/2016. Her Epworth sleepiness score is 8 out of 24, her fatigue score is 55 out of 63. She is divorced, on disability. She lives alone. She has 2 grown children. She quit smoking in 1996 and currently does not drink any alcohol. She does not drink caffeine on a daily basis. She does not keep a very set schedule for her sleep and wake time, typically in bed around 1 AM, wake up time around 8 AM. She has nocturia about twice per average night. She wakes up with a headache occasionally. Her CPAP machine as effective she reports. She did not bring her machine today. She came via transportation bus and is in a hurry to leave. I could not complete a full exam. She believes her DME company is [facility].   Her Past Medical History Is Significant For: Past Medical History:  Diagnosis Date  . Anemia   . Anxiety   . Arthritis    "pretty much all my joints"  . Asthma   . Benign paroxysmal positional vertigo 12/30/2012  . Breast cancer ([place]) 02/13/12   ruq  100'clock bx Ductal Carcinoma in Situ,(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  . Breast mass in female    L breast 2008, US showed likely fat necrosis  . Chronic lower back pain   . CKD (chronic kidney disease), stage III ([place])    "lower stage" (01/06/2014)  . Daily headache    last time was 2016/09/14 (brother passed away)  . Depression   . Dyspnea   . Gait abnormality 05/29/2016  . GERD (gastroesophageal reflux disease)   . History of blood transfusion 2011   "after one of my OR's"  . History of hiatal hernia   . History of stomach ulcers   . HSV (herpes simplex virus) infection   . Hx of radiation therapy 05/05/12- 07/15/12   right breast, 45 gray x 25 fx, lumpectomy cavity boosted to  16.2 gray  . Hyperlipemia   . Hypertension    sees Dr. Criss Rosales , [place]  . Hypothyroidism   . Kidney stones   . Knee pain, bilateral   . Migraines    "~ 3 times/month" (01/06/2014)  . Obesity   . OSA on CPAP    pt does not know settings  . Pancreatitis    hx of  . Personal history of radiation therapy 02/2012  . Pneumonia 1950's  . Polyneuropathy in diabetes(357.2)   . Schizophrenia ([place])   . 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 Tachycardia    with sx in 2008  . Thyroid disease   . Type II diabetes mellitus ([place])  dx 2014    Her Past Surgical History Is Significant For: Past Surgical History:  Procedure Laterality Date  . ABDOMINAL HYSTERECTOMY  1979?   partial  . benign cyst  Left    removed from left breast  . BREAST LUMPECTOMY  03/03/2012   Procedure: LUMPECTOMY;  Surgeon: Haywood Lasso, MD;  Location: [facility];  Service: General;  Laterality: Right;  . BREAST SURGERY Right 01/2012   "cancer"  . CHOLECYSTECTOMY  1980's?  . COLONOSCOPY WITH PROPOFOL N/A 09/28/2012   Procedure: COLONOSCOPY WITH PROPOFOL;  Surgeon: Lear Ng, MD;  Location: [facility];  Service: Endoscopy;  Laterality: N/A;  . FOOT FRACTURE SURGERY Right 1990's  . JOINT REPLACEMENT      x 3  . LUMBAR LAMINECTOMY/DECOMPRESSION MICRODISCECTOMY N/A 12/05/2016   Procedure: LEFT L5-S1 MICRODISCECTOMY;  Surgeon: Marybelle Killings, MD;  Location: [facility];  Service: Orthopedics;  Laterality: N/A;  . REVISION TOTAL KNEE ARTHROPLASTY  07/2009  . SHOULDER OPEN ROTATOR CUFF REPAIR Left 1990's  . THYROIDECTOMY  1970's  . TOTAL KNEE ARTHROPLASTY Bilateral 2009-06/2009   left; right    Her Family History Is Significant For: Family History  Problem Relation Age of Onset  . Heart disease Brother        Multiple MIs, starting in his 31s  . Diabetes Brother   . Thyroid disease Brother   . Hypertension Mother   . Diabetes Mother   . Breast cancer Mother 33  . Bone cancer Mother   .  Hypertension Sister   . Diabetes Sister   . Breast cancer Sister 8  . Thyroid disease Sister   . Breast cancer Maternal Grandmother   . Heart disease Maternal Grandmother   . Uterine cancer Other 19  . Breast cancer Paternal Aunt 23  . Breast cancer Paternal Grandmother        dx in her 70s  . Prostate cancer Paternal Grandfather   . Asthma Son     Her Social History Is Significant For: Social History   Socioeconomic History  . Marital status: Divorced    Spouse name: None  . Number of children: 2  . Years of education: 63  . Highest education level: None  Social Needs  . Financial resource strain: None  . Food insecurity - worry: None  . Food insecurity - inability: None  . Transportation needs - medical: None  . Transportation needs - non-medical: None  Occupational History  . Occupation: Disabled  Tobacco Use  . Smoking status: Former Smoker    Packs/day: 1.00    Years: 7.00    Pack years: 7.00    Types: Cigarettes    Last attempt to quit: 03/24/1992    Years since quitting: 25.1  . Smokeless tobacco: Never Used  Substance and Sexual Activity  . 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 Drug use: No  . Sexual activity: No    Birth control/protection: Surgical  Other Topics Concern  . None  Social History Narrative   Single. Disabled Biochemist, clinical.          Patient is right handed.    Denies caffeine use     Her Allergies Are:  Allergies  Allergen Reactions  . Bee Venom Anaphylaxis  . Shellfish Allergy Anaphylaxis  . Buprenorphine Hcl Other (See Comments)    Overly sedated  . Other Other (See Comments)    Cats - bad asthma, stopped up  :  Her Current Medications Are:  Outpatient Encounter Medications as of 05/04/2017  Medication Sig  . albuterol (PROAIR HFA) 108 (90 Base) MCG/ACT inhaler Inhale 2 puffs into the lungs every 4 (four) hours as needed for wheezing or shortness of breath.   [name] aspirin EC 81 MG  tablet Take 1 tablet (81 mg total) by mouth daily.  . B Complex-C (B-COMPLEX WITH VITAMIN C) tablet Take 1 tablet by mouth daily.  . budesonide-formoterol (SYMBICORT) 80-4.5 MCG/ACT inhaler Inhale 2 puffs into the lungs 2 (two) times daily. (Patient taking differently: Inhale 1 puff into the lungs 3 (three) times daily. )  . calcitRIOL (ROCALTROL) 0.25 MCG capsule Take 1 capsule (0.25 mcg total) by mouth 3 (three) times a week.  . Cholecalciferol (VITAMIN D-3) 1000 units CAPS Take 1,000 Units by mouth daily.  . Cyanocobalamin (VITAMIN B-12 PO) Take 1 tablet by mouth daily.  . diphenhydrAMINE (BENADRYL) 25 MG tablet Take 50 mg by mouth every 6 (six) hours as needed for itching or allergies.  [name] doxepin (SINEQUAN) 25 MG capsule Take 50 mg by mouth at bedtime.  [name] esomeprazole (NEXIUM) 40 MG capsule Take 1 capsule (40 mg total) by mouth daily before breakfast. (Patient taking differently: Take 40 mg by mouth 2 (two) times daily before a meal. )  . furosemide (LASIX) 40 MG tablet Take 1 tablet (40 mg total) by mouth daily.  [name] glucose blood (ONETOUCH VERIO) test strip Check blood sugar 3 times daily  . Insulin Detemir (LEVEMIR FLEXTOUCH) 100 UNIT/ML Pen Inject 10 Units into the skin at bedtime.  . insulin lispro (HUMALOG KWIKPEN) 100 UNIT/ML KiwkPen Inject 0.1 mLs (10 Units total) into the skin 3 (three) times daily.  . Iron-Vitamins (GERITOL PO) Take 1 tablet by mouth daily.  [name] JANUMET 50-500 MG tablet Take 1 tablet by mouth 2 (two) times daily with a meal.  . levothyroxine (SYNTHROID, LEVOTHROID) 150 MCG tablet Take 1 tablet (150 mcg total) by mouth daily before breakfast.  . linaclotide (LINZESS) 290 MCG CAPS capsule Take 290 mcg by mouth daily before breakfast.  . LORazepam (ATIVAN) 2 MG tablet Take 1-2 mg by mouth See admin instructions. Take 1 mg by mouth in the morning and take 2 mg by mouth at bedtime  . losartan-hydrochlorothiazide (HYZAAR) 100-12.5 MG tablet Take 1 tablet by mouth daily.  [name]  LYRICA 150 MG capsule Take 150 mg by mouth 3 (three) times daily.   . methocarbamol (ROBAXIN) 500 MG tablet Take 1 tablet (500 mg total) by mouth every 8 (eight) hours as needed for muscle spasms.  . Multiple Vitamin (MULTIVITAMIN WITH MINERALS) TABS tablet Take 1 tablet by mouth daily.  [name] oxyCODONE-acetaminophen (PERCOCET) 10-325 MG tablet Take 1 tablet by mouth every 6 (six) hours as needed for pain.  [name] POTASSIUM PO Take 1 tablet by mouth daily.  . rosuvastatin (CRESTOR) 20 MG tablet Take 20 mg by mouth daily.  [name] tiZANidine (ZANAFLEX) 4 MG tablet Take 4 mg by mouth 2 (two) times daily.   . valACYclovir (VALTREX) 1000 MG tablet Take 1,000 mg by mouth daily.  [name] venlafaxine XR (EFFEXOR-XR) 150 MG 24 hr capsule Take 150 mg by mouth 2 (two) times daily.  . ziprasidone (GEODON) 20 MG capsule Take 1 capsule (20 mg total) by mouth 2 (two) times daily with a meal. (Patient taking differently: Take 40 mg by mouth daily. )  . [DISCONTINUED] HYDROcodone-acetaminophen (NORCO/VICODIN) 5-325 MG tablet Take 1 tablet by mouth 3 (three) times daily as needed for moderate  pain.  . [DISCONTINUED] insulin aspart (NOVOLOG) 100 UNIT/ML injection Inject 0-9 Units into the skin 3 (three) times daily with meals. 0-9 Units, Subcutaneous CBG < 70: implement hypoglycemia protocol CBG 70 - 120: 0 units CBG 121 - 150: 1 unit CBG 151 - 200: 2 units CBG 201 - 250: 3 units CBG 251 - 300: 5 units CBG 301 - 350: 7 units CBG 351 - 400: 9 units CBG > 400: call MD  . [DISCONTINUED] levothyroxine (SYNTHROID, LEVOTHROID) 150 MCG tablet Take 1 tablet (150 mcg total) by mouth daily before breakfast.   No facility-administered encounter medications on file as of 05/04/2017.   :  Review of Systems:  Out of a complete 14 point review of systems, all are reviewed and negative with the exception of these symptoms as listed below: Review of Systems  Neurological:       Pt presents today to follow up on her cpap. Pt has changed her  name back to maiden last name.    Objective:  Neurological Exam  Physical Exam Physical Examination:   Vitals:   05/04/17 0934  BP: (!) 166/85  Pulse: (!) 112    General Examination: The patient is a very pleasant 62 y.o. female in no acute distress. She appears well-developed and well-nourished and well groomed.   HEENT:Normocephalic, atraumatic, pupils are equal, round and reactive to light and accommodation. Extraocular tracking is fair. Hearing is grossly intact. Face is symmetric with normal facial animation and normal facial sensation. Speech is clear with no dysarthria noted. There is no hypophonia. There is no lip, neck/head, jaw or voice tremor. Neck with FROM, oropharynx exam reveals: moderatemouth dryness, and markedairway crowding. Mallampati is class III.Tongue protrudes centrally and palate elevates symmetrically.   Chest:Clear to auscultation without wheezing, rhonchi or crackles noted.  Heart:S1+S2+0, regular and normal without murmurs, rubs or gallops noted.   Abdomen:Soft, non-tender and non-distended with normal bowel sounds appreciated on auscultation.  Extremities:There is no edema in the distal lower extremities bilaterally.  Skin: Warm and dry without trophic changes noted.  Musculoskeletal: exam reveals: low back pain, midline.   Neurologically:  Mental status: The patient is awake, alert and oriented in all 4 spheres. Herimmediate and remote memory, attention, language skills and fund of knowledge are appropriate. There is no evidence of aphasia, agnosia, apraxia or anomia. Speech is clear with normal prosody and enunciation. Mood is decreased rangeand affect is better than last time.    Cranial nerves II - XII are as described above under HEENT exam. Motor exam: normal bulk strength and tone. Fine motor skills are mildly impaired globally. She stands up slowly and relies on her rolling walker. She walks slowly and bent over the walker.  Sensory exam is intact to light touch. No obvious cerebellar signs. Posture is stooped and she leans on the walker.  Assessment and Plan:  In summary, DAKOTAH HEIMAN a very pleasant 62 year old female with a complex medical history of breast cancer, chronic low back pain, mood disorder, reflux disease, anemia, arthritis, hyperlipidemia, hypertension, hypothyroidism, kidney stones, migraine headaches, history of pancreatitis, diabetes and morbid obesity with a BMI of over 50, who presents for follow-up consultation of her obstructive sleep apnea, on home CPAP therapy. She has been compliant with CPAP in general, average usage of nearly 8 hours but she has lapses in treatment, reports increase in back pain. Her recent lumbar spine MRI from earlier this month does indicate progression of her lumbar spinal stenosis and degenerative disc  disease. She had back surgery last year. She is encouraged to be fully compliant with PAP treatment. I placed an order for updating her supplies and also to get her a chinstrap. She will benefit from a mask refit which I am requesting through her DME provider. She is advised to follow-up routinely in one year, she can see Jinny Blossom next time. I answered all her questions today and she was in agreement. I spent 20 minutes in total face-to-face time with the patient, more than 50% of which was spent in counseling and coordination of care, reviewing test results, reviewing medication and discussing or reviewing the diagnosis of OSA, its prognosis and treatment options. Pertinent laboratory and imaging test results that were available during this visit with the patient were reviewed by me and considered in my medical decision making (see chart for details).

## 2017-05-04 NOTE — Patient Instructions (Addendum)
Please continue using your CPAP regularly. While your insurance requires that you use CPAP at least 4 hours each night on 70% of the nights, I recommend, that you not skip any nights and use it throughout the night if you can. Getting used to CPAP and staying with the treatment long term does take time and patience and discipline. Untreated obstructive sleep apnea when it is moderate to severe can have an adverse impact on cardiovascular health and raise her risk for heart disease, arrhythmias, hypertension, congestive heart failure, stroke and diabetes. Untreated obstructive sleep apnea causes sleep disruption, nonrestorative sleep, and sleep deprivation. This can have an impact on your day to day functioning and cause daytime sleepiness and impairment of cognitive function, memory loss, mood disturbance, and problems focussing. Using CPAP regularly can improve these symptoms. Try the best you can with the CPAP. I will order new supplies and a chinstrap.  We can see you in 1 year, you can see one of our nurse practitioners, Jinny Blossom, again next time.

## 2017-05-05 ENCOUNTER — Other Ambulatory Visit: Payer: Self-pay

## 2017-05-05 ENCOUNTER — Telehealth: Payer: Self-pay | Admitting: Endocrinology

## 2017-05-05 NOTE — Telephone Encounter (Signed)
I tried to call patient to let her know that levothyroxine was to be taken 150 mcg once daily. There was no VM set up to leave message.

## 2017-05-05 NOTE — Telephone Encounter (Signed)
levothyroxine (SYNTHROID, LEVOTHROID) 150 MCG tablet   Patient believes she is suppose to be taking two of these a day. She need clarification on how many she should be taking daily. She thought the dr said it was twice a day   Please advise

## 2017-05-07 NOTE — Telephone Encounter (Signed)
Spoke with patient to inform her to take levothyroxine 1x daily.

## 2017-05-26 ENCOUNTER — Telehealth: Payer: Self-pay | Admitting: Endocrinology

## 2017-05-26 NOTE — Telephone Encounter (Signed)
Spoke with patient and she states that she has been "dizzy all day with nausea."  She tried to eat something with no relief.  She called her PCP and they advised her to go to the ER if her symptoms worsen, and if not then make an appointment with her PCP tomorrow.  I told her that I agreed with her PCP and she should follow that advise.    FYI

## 2017-05-26 NOTE — Telephone Encounter (Signed)
Patient stated that she has been having some dizziness, she would like the nurse to give her a call

## 2017-05-28 ENCOUNTER — Other Ambulatory Visit: Payer: Self-pay | Admitting: Endocrinology

## 2017-05-28 ENCOUNTER — Ambulatory Visit
Admission: RE | Admit: 2017-05-28 | Discharge: 2017-05-28 | Disposition: A | Discharge status: Home / Self Care | Payer: Medicaid Other | Source: Ambulatory Visit | Attending: Adult Health | Admitting: Adult Health

## 2017-05-28 DIAGNOSIS — Z170 Estrogen receptor positive status [ER+]: Principal | ICD-10-CM

## 2017-05-28 DIAGNOSIS — C50411 Malignant neoplasm of upper-outer quadrant of right female breast: Secondary | ICD-10-CM

## 2017-05-30 ENCOUNTER — Other Ambulatory Visit: Payer: Self-pay | Admitting: Endocrinology
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
## 2017-06-09 ENCOUNTER — Encounter: Payer: Self-pay | Admitting: Oncology

## 2017-06-09 ENCOUNTER — Inpatient Hospital Stay: Payer: Medicare Other | Attending: Oncology | Admitting: Oncology

## 2017-06-09 VITALS — BP 124/90 | HR 102 | Temp 98.6°F | Resp 18 | Ht 65.0 in

## 2017-06-09 DIAGNOSIS — M4800 Spinal stenosis, site unspecified: Secondary | ICD-10-CM

## 2017-06-09 DIAGNOSIS — Z86000 Personal history of in-situ neoplasm of breast: Secondary | ICD-10-CM | POA: Insufficient documentation

## 2017-06-09 DIAGNOSIS — C50411 Malignant neoplasm of upper-outer quadrant of right female breast: Secondary | ICD-10-CM

## 2017-06-11 ENCOUNTER — Encounter (HOSPITAL_COMMUNITY): Payer: Self-pay

## 2017-06-15 ENCOUNTER — Ambulatory Visit (INDEPENDENT_AMBULATORY_CARE_PROVIDER_SITE_OTHER): Payer: Medicare Other | Admitting: Endocrinology

## 2017-06-15 ENCOUNTER — Encounter: Payer: Self-pay | Admitting: Endocrinology

## 2017-06-15 VITALS — BP 146/84 | HR 105 | Wt 335.8 lb

## 2017-06-15 DIAGNOSIS — Z794 Long term (current) use of insulin: Secondary | ICD-10-CM | POA: Diagnosis not present

## 2017-06-15 DIAGNOSIS — E119 Type 2 diabetes mellitus without complications: Secondary | ICD-10-CM | POA: Diagnosis not present

## 2017-06-15 DIAGNOSIS — E890 Postprocedural hypothyroidism: Secondary | ICD-10-CM

## 2017-06-15 LAB — TSH: TSH: 4.04 u[IU]/mL (ref 0.35–4.50)

## 2017-06-15 LAB — POCT GLYCOSYLATED HEMOGLOBIN (HGB A1C): Hemoglobin A1C: 6.9

## 2017-06-15 NOTE — Progress Notes (Signed)
Subjective:    Patient ID: [name], female    DOB: 11-Mar-1955, 62 y.o.   MRN: [id number]  HPI Pt returns for f/u of postsurgical hypothyroidism (she had thyroidectomy in approx 1988, for a goiter; she says no cancer was found; she has been on prescribed thyroid hormone therapy since then; she has not recently had thyroid imaging; she does not know why TSH fluctuates, but med noncompliance has been noted; she takes synthroid as rx'ed; MRI (2018) showed normal orbits are normal, and no mention was made of the pituitary).  She takes synthroid as rx'ed Pt also returns for f/u of diabetes mellitus: DM type: Insulin-requiring type 2.   Dx'ed: 1660 Complications: renal insuff and TIA Therapy: insulin since 2015, and janumet GDM: never DKA: never Severe hypoglycemia: never.   Pancreatitis: never Pancreatic imaging: Fatty atrophy was noted on 2018 CT.  Other: she takes multiple daily injections Interval history: she says cbg's vary from 96-210. There is no trend throughout the day. Pt takes meds as rx'ed. She got a steroid shot 2 weekds ago, into the lower back.  She says before then, a1c was 6.7% Past Medical History:  Diagnosis Date  . Anemia   . Anxiety   . Arthritis    "pretty much all my joints"  . Asthma   . Benign paroxysmal positional vertigo 12/30/2012  . Breast cancer ([place]) 02/13/12   ruq  100'clock bx Ductal Carcinoma in Situ,(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  . Breast mass in female    L breast 2008, US showed likely fat necrosis  . Chronic lower back pain   . CKD (chronic kidney disease), stage III ([place])    "lower stage" (01/06/2014)  . Daily headache    last time was 2016/08/28 (brother passed away)  . Depression   . Dyspnea   . Gait abnormality 05/29/2016  . GERD (gastroesophageal reflux disease)   . History of blood transfusion 2011   "after one of my OR's"  . History of hiatal hernia   . History of stomach ulcers   . HSV (herpes simplex virus) infection   . Hx of  radiation therapy 05/05/12- 07/15/12   right breast, 45 gray x 25 fx, lumpectomy cavity boosted to 16.2 gray  . Hyperlipemia   . Hypertension    sees Dr. Criss Rosales , [facility]  . Hypothyroidism   . Kidney stones   . Knee pain, bilateral   . Migraines    "~ 3 times/month" (01/06/2014)  . Obesity   . OSA on CPAP    pt does not know settings  . Pancreatitis    hx of  . Personal history of radiation therapy 02/2012  . Pneumonia 1950's  . Polyneuropathy in diabetes(357.2)   . Schizophrenia ([place])   . 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 Tachycardia    with sx in 2008  . Thyroid disease   . Type II diabetes mellitus ([place])    dx 2014    Past Surgical History:  Procedure Laterality Date  . ABDOMINAL HYSTERECTOMY  1979?   partial  . benign cyst  Left    removed from left breast  . BREAST BIOPSY Right 02/13/2012  . BREAST LUMPECTOMY  03/03/2012   Procedure: LUMPECTOMY;  Surgeon: Haywood Lasso, MD;  Location: [facility];  Service: General;  Laterality: Right;  . BREAST SURGERY Right 01/2012   "cancer"  . CHOLECYSTECTOMY  1980's?  . COLONOSCOPY WITH PROPOFOL N/A 09/28/2012   Procedure: COLONOSCOPY WITH PROPOFOL;  Surgeon:  Lear Ng, MD;  Location: [facility];  Service: Endoscopy;  Laterality: N/A;  . FOOT FRACTURE SURGERY Right 1990's  . JOINT REPLACEMENT      x 3  . LUMBAR LAMINECTOMY/DECOMPRESSION MICRODISCECTOMY N/A 12/05/2016   Procedure: LEFT L5-S1 MICRODISCECTOMY;  Surgeon: Marybelle Killings, MD;  Location: [facility];  Service: Orthopedics;  Laterality: N/A;  . REVISION TOTAL KNEE ARTHROPLASTY  07/2009  . SHOULDER OPEN ROTATOR CUFF REPAIR Left 1990's  . THYROIDECTOMY  1970's  . TOTAL KNEE ARTHROPLASTY Bilateral 2009-06/2009   left; right    Social History   Socioeconomic History  . Marital status: Divorced    Spouse name: Not on file  . Number of children: 2  . Years of education: 6  . Highest education level: Not on file  Occupational History  .  Occupation: Disabled  Social Needs  . Financial resource strain: Not on file  . Food insecurity:    Worry: Not on file    Inability: Not on file  . Transportation needs:    Medical: Not on file    Non-medical: Not on file  Tobacco Use  . Smoking status: Former Smoker    Packs/day: 1.00    Years: 7.00    Pack years: 7.00    Types: Cigarettes    Last attempt to quit: 03/24/1992    Years since quitting: 25.2  . Smokeless tobacco: Never Used  Substance and Sexual Activity  . 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 Drug use: No  . Sexual activity: Never    Birth control/protection: Surgical  Lifestyle  . Physical activity:    Days per week: Not on file    Minutes per session: Not on file  . Stress: Not on file  Relationships  . Social connections:    Talks on phone: Not on file    Gets together: Not on file    Attends religious service: Not on file    Active member of club or organization: Not on file    Attends meetings of clubs or organizations: Not on file    Relationship status: Not on file  . Intimate partner violence:    Fear of current or ex partner: Not on file    Emotionally abused: Not on file    Physically abused: Not on file    Forced sexual activity: Not on file  Other Topics Concern  . Not on file  Social History Narrative   Single. Disabled Biochemist, clinical.          Patient is right handed.    Denies caffeine use     Current Outpatient Medications on File Prior to Visit  Medication Sig Dispense Refill  . albuterol (PROAIR HFA) 108 (90 Base) MCG/ACT inhaler Inhale 2 puffs into the lungs every 4 (four) hours as needed for wheezing or shortness of breath.     [name] aspirin EC 81 MG tablet Take 1 tablet (81 mg total) by mouth daily. 30 tablet 0  . B Complex-C (B-COMPLEX WITH VITAMIN C) tablet Take 1 tablet by mouth daily.    . budesonide-formoterol (SYMBICORT) 80-4.5 MCG/ACT inhaler Inhale 2 puffs into the lungs 2 (two)  times daily. (Patient taking differently: Inhale 1 puff into the lungs 3 (three) times daily. ) 1 Inhaler 11  . calcitRIOL (ROCALTROL) 0.25 MCG capsule TAKE ONE CAPSULE BY MOUTH three times WEEKLY 13 capsule 5  . Cholecalciferol (VITAMIN D-3) 1000 units CAPS Take  1,000 Units by mouth daily.    . Cyanocobalamin (VITAMIN B-12 PO) Take 1 tablet by mouth daily.    . diphenhydrAMINE (BENADRYL) 25 MG tablet Take 50 mg by mouth every 6 (six) hours as needed for itching or allergies.    [name] doxepin (SINEQUAN) 25 MG capsule Take 50 mg by mouth at bedtime.    [name] esomeprazole (NEXIUM) 40 MG capsule Take 1 capsule (40 mg total) by mouth daily before breakfast. (Patient taking differently: Take 40 mg by mouth 2 (two) times daily before a meal. ) 30 capsule 1  . furosemide (LASIX) 40 MG tablet Take 1 tablet (40 mg total) by mouth daily. 30 tablet 0  . glucose blood (ONETOUCH VERIO) test strip Check blood sugar 3 times daily 100 each 12  . HUMALOG KWIKPEN 100 UNIT/ML KiwkPen INJECT 10 UNITS SUBCUTANEOUSLY THREE TIMES DAILY 15 mL 4  . Insulin Detemir (LEVEMIR FLEXTOUCH) 100 UNIT/ML Pen Inject 10 Units into the skin at bedtime. 15 mL 4  . Iron-Vitamins (GERITOL PO) Take 1 tablet by mouth daily.    [name] JANUMET 50-500 MG tablet Take 1 tablet by mouth 2 (two) times daily with a meal. 60 tablet 1  . levothyroxine (SYNTHROID, LEVOTHROID) 150 MCG tablet Take 1 tablet (150 mcg total) by mouth daily before breakfast. 30 tablet 2  . linaclotide (LINZESS) 290 MCG CAPS capsule Take 290 mcg by mouth daily before breakfast.    . LORazepam (ATIVAN) 2 MG tablet Take 1-2 mg by mouth See admin instructions. Take 1 mg by mouth in the morning and take 2 mg by mouth at bedtime    . losartan-hydrochlorothiazide (HYZAAR) 100-12.5 MG tablet Take 1 tablet by mouth daily.    [name] LYRICA 150 MG capsule Take 150 mg by mouth 3 (three) times daily.   1  . methocarbamol (ROBAXIN) 500 MG tablet Take 1 tablet (500 mg total) by mouth every 8 (eight) hours  as needed for muscle spasms. 30 tablet 0  . Multiple Vitamin (MULTIVITAMIN WITH MINERALS) TABS tablet Take 1 tablet by mouth daily.    [name] oxyCODONE-acetaminophen (PERCOCET) 10-325 MG tablet Take 1 tablet by mouth every 6 (six) hours as needed for pain. 50 tablet 0  . POTASSIUM PO Take 1 tablet by mouth daily.    . rosuvastatin (CRESTOR) 20 MG tablet Take 20 mg by mouth daily.    [name] tiZANidine (ZANAFLEX) 4 MG tablet Take 4 mg by mouth 2 (two) times daily.     . valACYclovir (VALTREX) 1000 MG tablet Take 1,000 mg by mouth daily.    [name] venlafaxine XR (EFFEXOR-XR) 150 MG 24 hr capsule Take 150 mg by mouth 2 (two) times daily.    . ziprasidone (GEODON) 20 MG capsule Take 1 capsule (20 mg total) by mouth 2 (two) times daily with a meal. (Patient taking differently: Take 40 mg by mouth daily. )     No current facility-administered medications on file prior to visit.     Allergies  Allergen Reactions  . Bee Venom Anaphylaxis  . Shellfish Allergy Anaphylaxis  . Buprenorphine Hcl Other (See Comments)    Overly sedated  . Other Other (See Comments)    Cats - bad asthma, stopped up    Family History  Problem Relation Age of Onset  . Heart disease Brother        Multiple MIs, starting in his 46s  . Diabetes Brother   . Thyroid disease Brother   . Hypertension Mother   . Diabetes Mother   .  Breast cancer Mother 18  . Bone cancer Mother   . Hypertension Sister   . Diabetes Sister   . Breast cancer Sister 39  . Thyroid disease Sister   . Breast cancer Maternal Grandmother   . Heart disease Maternal Grandmother   . Uterine cancer Other 19  . Breast cancer Paternal Aunt 66  . Breast cancer Paternal Grandmother        dx in her 64s  . Prostate cancer Paternal Grandfather   . Asthma Son     BP (!) 146/84   Pulse (!) 105   Wt (!) 335 lb 12.8 oz (152.3 kg)   SpO2 96%   BMI 55.88 kg/m    Review of Systems She denies hypoglycemia.      Objective:   Physical Exam VITAL SIGNS:  See vs  page GENERAL: no distress Pulses: dorsalis pedis intact bilat.   MSK: no deformity of the feet CV: no leg edema Skin:  no ulcer on the feet.  normal color and temp on the feet. Neuro: sensation is intact to touch on the feet   A1c=8.8%  Lab Results  Component Value Date   CREATININE 1.29 (H) 12/04/2016   BUN 16 12/04/2016   NA 135 12/04/2016   K 4.1 12/04/2016   CL 98 (L) 12/04/2016   CO2 27 12/04/2016       Assessment & Plan:  Insulin-requiring type 2 DM, with renal insuff: new to me.  Glycemic control is apparently improved, which is not yet reflected in a1c.  Hypothyroidism: recheck today.    Patient Instructions  blood tests are requested for you today.  We'll let you know about the results. Please continue the same medications check your blood sugar 3 times a day.  vary the time of day when you check, between before the 3 meals, and at bedtime.  also check if you have symptoms of your blood sugar being too high or too low.  please keep a record of the readings and bring it to your next appointment here (or you can bring the meter itself).  You can write it on any piece of paper.  please call us sooner if your blood sugar goes below 70, or if you have a lot of readings over 200.  Please come back for a follow-up appointment in 2 months.

## 2017-06-15 NOTE — Patient Instructions (Signed)
blood tests are requested for you today.  We'll let you know about the results. Please continue the same medications check your blood sugar 3 times a day.  vary the time of day when you check, between before the 3 meals, and at bedtime.  also check if you have symptoms of your blood sugar being too high or too low.  please keep a record of the readings and bring it to your next appointment here (or you can bring the meter itself).  You can write it on any piece of paper.  please call us sooner if your blood sugar goes below 70, or if you have a lot of readings over 200.  Please come back for a follow-up appointment in 2 months.

## 2017-06-16 ENCOUNTER — Telehealth: Payer: Self-pay

## 2017-06-16 ENCOUNTER — Ambulatory Visit (HOSPITAL_COMMUNITY)
Admission: RE | Admit: 2017-06-16 | Discharge: 2017-06-16 | Disposition: A | Discharge status: Home / Self Care | Payer: Medicare Other | Source: Ambulatory Visit | Attending: Oncology | Admitting: Oncology

## 2017-06-16 DIAGNOSIS — C50411 Malignant neoplasm of upper-outer quadrant of right female breast: Secondary | ICD-10-CM | POA: Diagnosis not present

## 2017-06-16 NOTE — Progress Notes (Signed)
Right lower extremity venous duplex has been completed. Negative for obvious evidence of DVT. Results were given to [facility] at Dr. Virgie Dad office.  06/16/17 10:10 AM Carlos Levering RVT

## 2017-06-16 NOTE — Telephone Encounter (Signed)
Received call from "Marya Amsler" regarding results of right lower venous doppler is negative for DVT. Notified Dr Jana Hakim.

## 2017-06-23 IMAGING — CR DG SKULL COMPLETE 4+V
5 series · 5 of 5 positions shown · non-contrast
Comparison: MRI 11/12/2014 .

CLINICAL DATA: Fall.

EXAM:
SKULL - COMPLETE 4 + VIEW

[w townes]
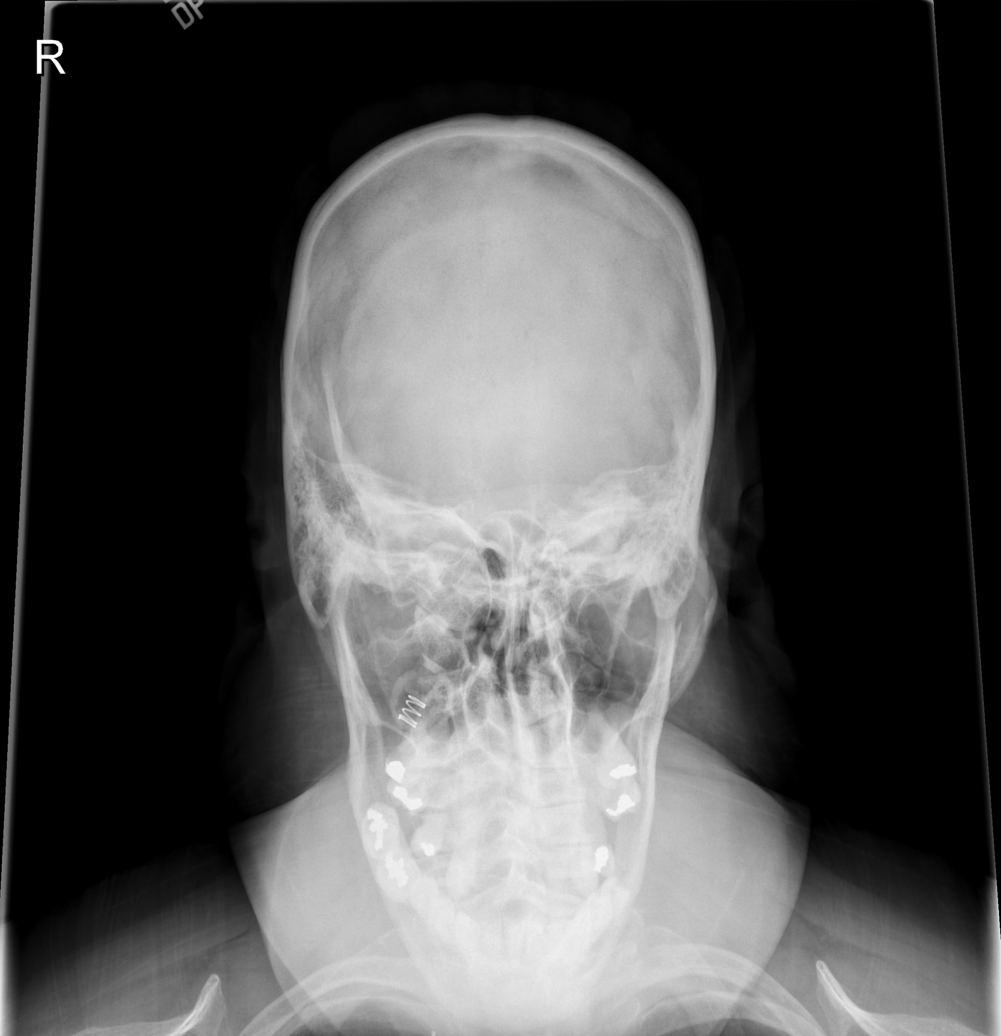

[w skull a.p./p.a. (1 of 2)]
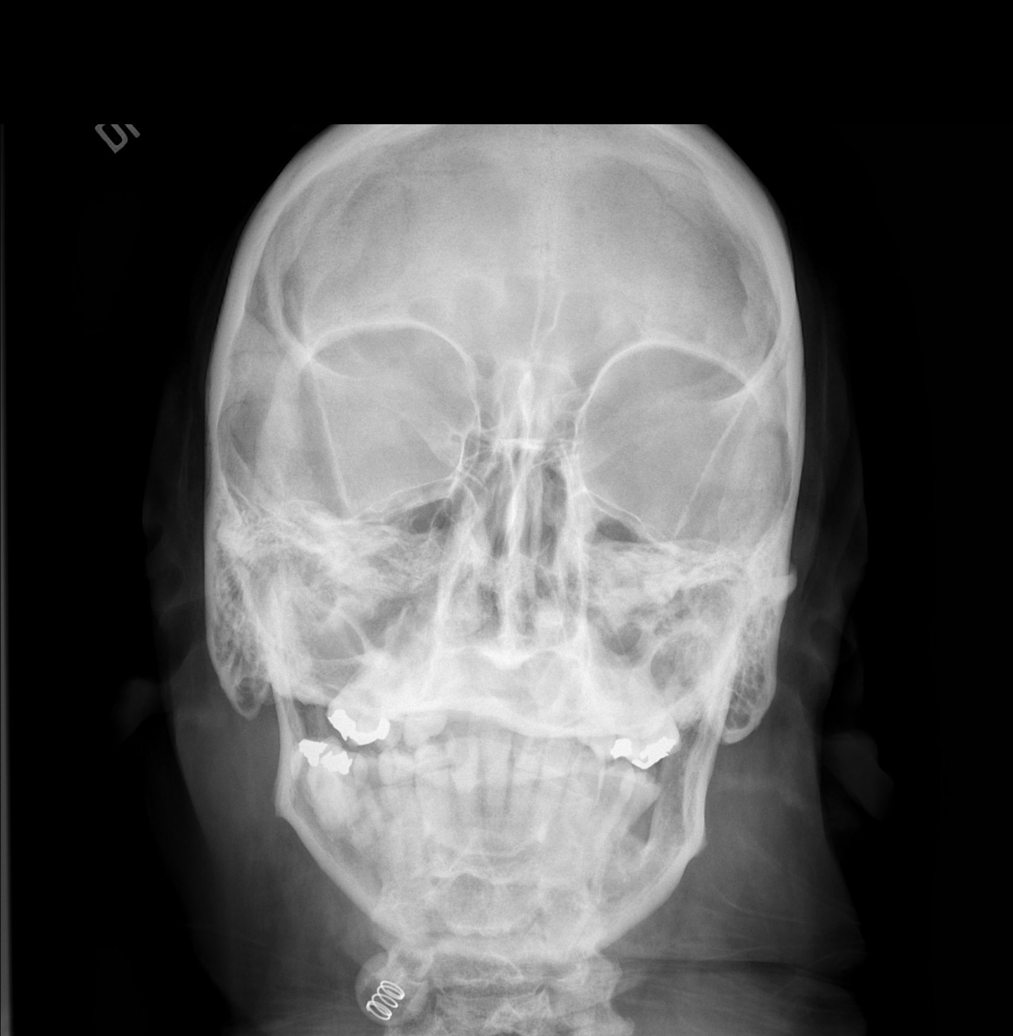

[w skull lat (1 of 2)]
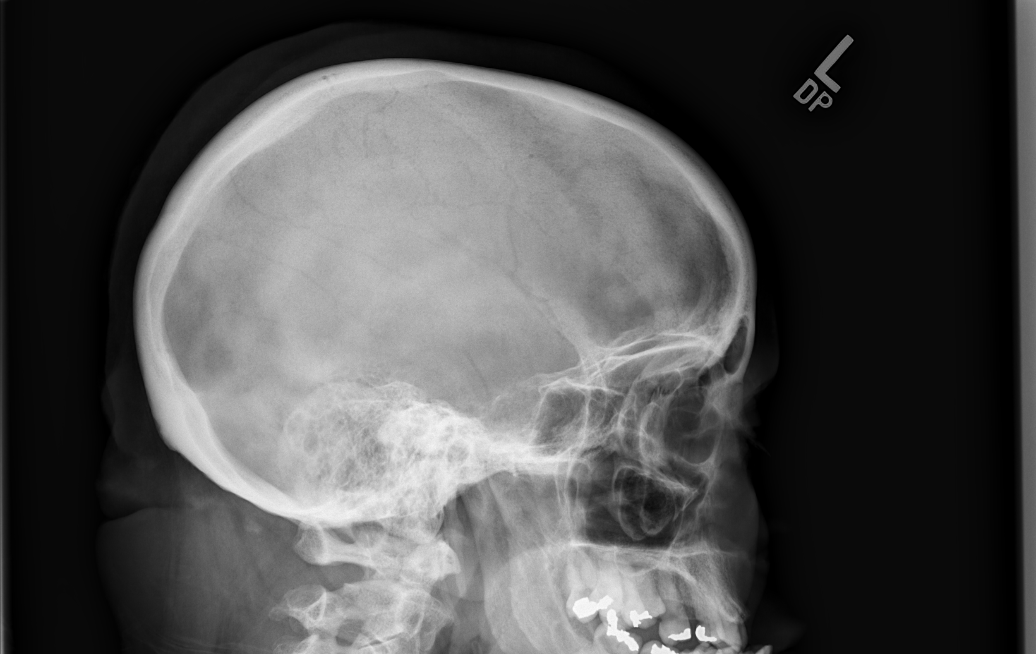

[w skull lat (2 of 2)]
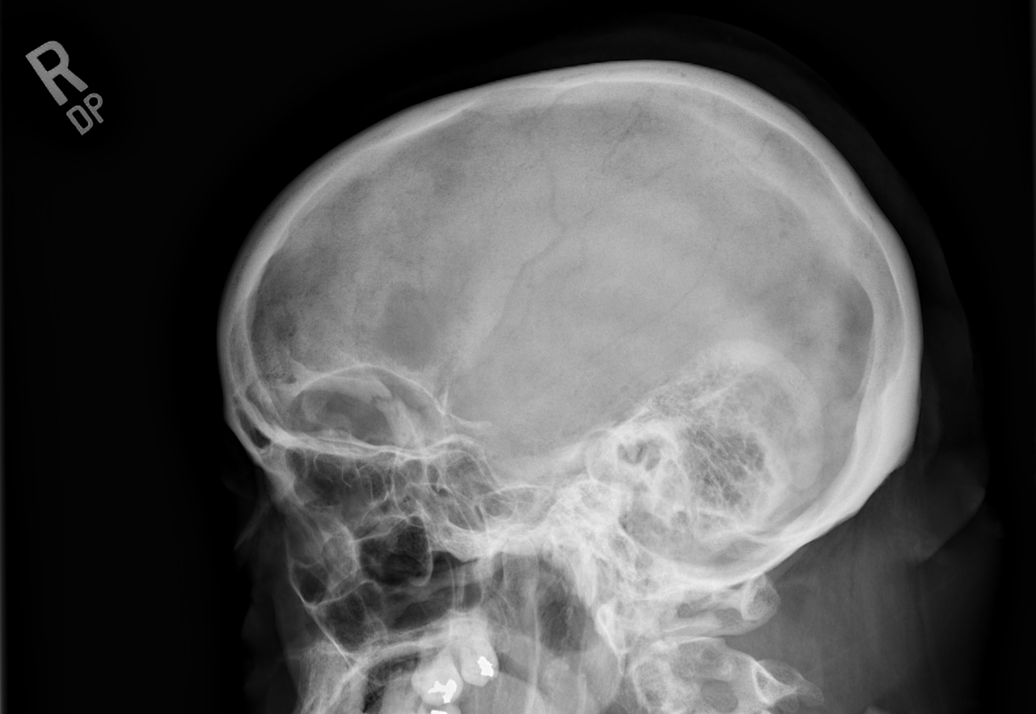

[w skull a.p./p.a. (2 of 2)]
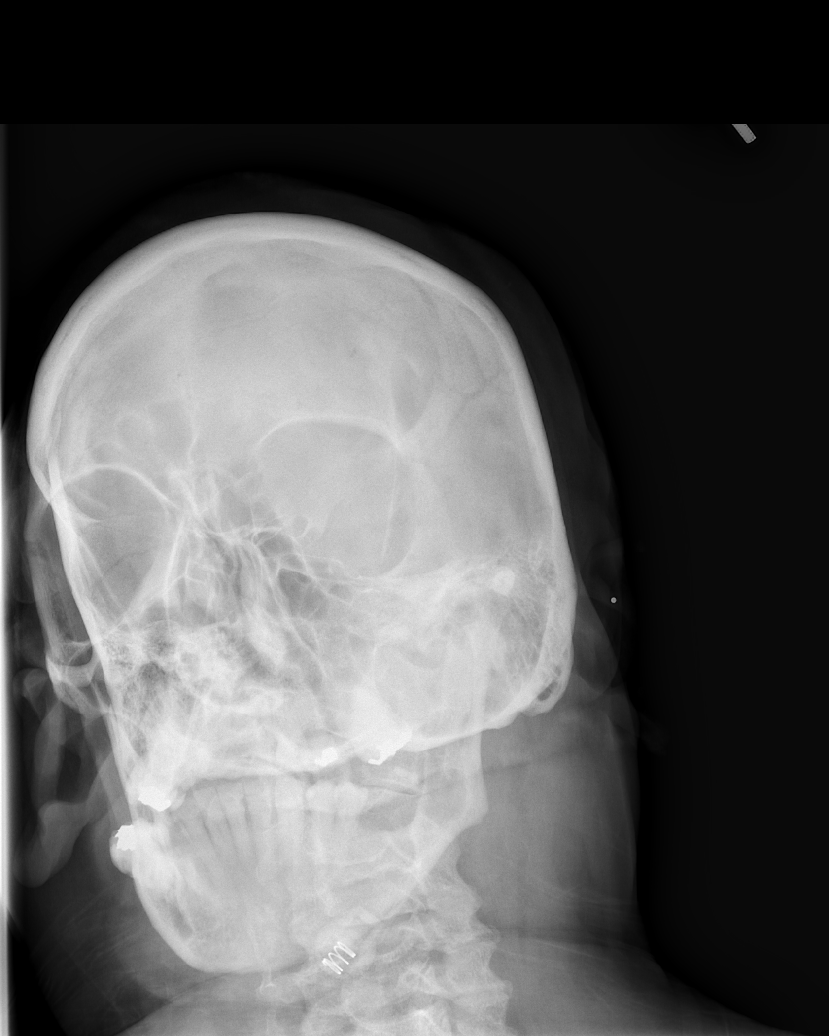

[5 of 5 positions shown; findings below may reference images not displayed]

FINDINGS: No evidence of fracture or focal acute abnormality. Subtle lucencies
are noted throughout the skull. Other these may represent vascular
lakes to exclude metastatic disease and/or myeloma whole-body bone
scan and serum protein electrophoresis suggested.
IMPRESSION: 1. No acute abnormality.

2. Subtle lucencies are noted throughout the skull. Although these
may represent vascular lakes to exclude metastatic disease and/or
myeloma whole-body bone scan and serum protein electrophoresis
suggested.

## 2017-06-24 ENCOUNTER — Other Ambulatory Visit: Payer: Self-pay

## 2017-06-24 ENCOUNTER — Telehealth: Payer: Self-pay | Admitting: Endocrinology

## 2017-06-24 MED ORDER — JANUMET 50-500 MG PO TABS: 1.0000 | ORAL_TABLET | Freq: Two times a day (BID) | ORAL | 1 refills | Status: DC

## 2017-06-24 NOTE — Telephone Encounter (Signed)
JANUMET 50-500 MG tablet  Patient need prescription sent over to new pharmacy.    [facility], [place], [address]

## 2017-06-24 NOTE — Telephone Encounter (Signed)
I have sent to patient;'s pharmacy.

## 2017-07-06 ENCOUNTER — Telehealth: Payer: Self-pay | Admitting: *Deleted

## 2017-07-06 DIAGNOSIS — C50411 Malignant neoplasm of upper-outer quadrant of right female breast: Secondary | ICD-10-CM

## 2017-07-06 NOTE — Telephone Encounter (Signed)
This RN spoke with pt per her VM stating " when I saw Dr Jana Hakim last month he said if the swelling in my arm and leg got worse to call and he would put in a referral to the lymphedema clinic"  [name] states swelling " seems worse " - not pitting nor tender to touch.  Per MD  - referral for lymphedema clinic obtained.

## 2017-07-20 ENCOUNTER — Ambulatory Visit: Payer: Medicaid Other | Admitting: Physical Therapy

## 2017-07-21 ENCOUNTER — Other Ambulatory Visit: Payer: Self-pay | Admitting: Endocrinology

## 2017-07-27 ENCOUNTER — Encounter: Payer: Self-pay | Admitting: Physical Therapy

## 2017-07-27 ENCOUNTER — Other Ambulatory Visit: Payer: Self-pay

## 2017-07-27 ENCOUNTER — Ambulatory Visit: Payer: Medicare Other | Attending: Oncology | Admitting: Physical Therapy

## 2017-07-27 DIAGNOSIS — R262 Difficulty in walking, not elsewhere classified: Secondary | ICD-10-CM | POA: Diagnosis present

## 2017-07-27 DIAGNOSIS — I890 Lymphedema, not elsewhere classified: Secondary | ICD-10-CM | POA: Diagnosis present

## 2017-07-27 NOTE — Therapy (Signed)
[place], [place], [address] Phone: [phone]   Fax:  [phone]  Physical Therapy Evaluation  Patient Details  Name: [name] MRN: [id number] Date of Birth: 08/12/1955 Referring Provider: Magrinat   Encounter Date: 07/27/2017  PT End of Session - 07/27/17 1718    Visit Number  1    Number of Visits  5    Date for PT Re-Evaluation  09-11-17    PT Start Time  9024    PT Stop Time  1520    PT Time Calculation (min)  45 min    Activity Tolerance  Patient tolerated treatment well    Behavior During Therapy  [facility] for tasks assessed/performed       Past Medical History:  Diagnosis Date  . Anemia   . Anxiety   . Arthritis    "pretty much all my joints"  . Asthma   . Benign paroxysmal positional vertigo 12/30/2012  . 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  . Breast mass in female    L breast 2008, US showed likely fat necrosis  . Chronic lower back pain   . CKD (chronic kidney disease), stage III ([place])    "lower stage" (01/06/2014)  . Daily headache    last time was 2016-09-11 (brother passed away)  . Depression   . Dyspnea   . Gait abnormality 05/29/2016  . GERD (gastroesophageal reflux disease)   . History of blood transfusion 2011   "after one of my OR's"  . History of hiatal hernia   . History of stomach ulcers   . HSV (herpes simplex virus) infection   . Hx of radiation therapy 05/05/12- 07/15/12   right breast, 45 gray x 25 fx, lumpectomy cavity boosted to 16.2 gray  . Hyperlipemia   . Hypertension    sees Dr. Criss Rosales , [facility]  . Hypothyroidism   . Kidney stones   . Knee pain, bilateral   . Migraines    "~ 3 times/month" (01/06/2014)  . Obesity   . OSA on CPAP    pt does not know settings  . Pancreatitis    hx of  . Personal history of radiation therapy 02/2012  . Pneumonia 1950's  . Polyneuropathy in  diabetes(357.2)   . Schizophrenia ([place])   . 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 Tachycardia    with sx in 2008  . Thyroid disease   . Type II diabetes mellitus ([place])    dx 2014    Past Surgical History:  Procedure Laterality Date  . ABDOMINAL HYSTERECTOMY  1979?   partial  . benign cyst  Left    removed from left breast  . BREAST BIOPSY Right 02/13/2012  . BREAST LUMPECTOMY  03/03/2012   Procedure: LUMPECTOMY;  Surgeon: Haywood Lasso, MD;  Location: [facility];  Service: General;  Laterality: Right;  . BREAST SURGERY Right 01/2012   "cancer"  . CHOLECYSTECTOMY  1980's?  . COLONOSCOPY WITH PROPOFOL N/A 09/28/2012   Procedure: COLONOSCOPY WITH PROPOFOL;  Surgeon: Lear Ng, MD;  Location: [facility];  Service: Endoscopy;  Laterality: N/A;  . FOOT FRACTURE SURGERY Right 1990's  . JOINT REPLACEMENT      x 3  . LUMBAR LAMINECTOMY/DECOMPRESSION MICRODISCECTOMY N/A 12/05/2016   Procedure: LEFT L5-S1 MICRODISCECTOMY;  Surgeon: Marybelle Killings, MD;  Location: [facility];  Service: Orthopedics;  Laterality: N/A;  . REVISION TOTAL KNEE  ARTHROPLASTY  07/2009  . SHOULDER OPEN ROTATOR CUFF REPAIR Left 1990's  . THYROIDECTOMY  1970's  . TOTAL KNEE ARTHROPLASTY Bilateral 2009-06/2009   left; right    There were no vitals filed for this visit.   Subjective Assessment - 07/27/17 1442    Subjective  My right thigh is swelling. I am really worried about it. I had to go up to a size 3x due to the swelling in my upper thigh.     Pertinent History  (1)  status post right lumpectomy on 03/03/2012 for a 2.8 cm, grade 1 ductal carcinoma in situ, estrogen receptor 100% positive, progesterone receptor 100% positive, with clear margins.DM and HTN; (2)  completed radiation therapy under the care of Dr. Sondra Come 07/15/2012.  She does not currently have her HTN meds. Fell in November and sustained a hairline fracture in some UE bone so she is wearing a velcro wrist brace currently. Is on Lyrica for  neuropathy from diabetes. h/o left RTC repair in about 2000.    Patient Stated Goals  to make sure there is no lymphedema in my leg and my arm is ok and there is no more swelling in there, and to make sure this hard place in my breast is not anything bad     Currently in Pain?  Yes    Pain Score  9     Pain Location  Back    Pain Orientation  Lower    Pain Descriptors / Indicators  Sharp;Throbbing    Pain Type  Chronic pain    Pain Radiating Towards  down in to the right leg    Pain Onset  More than a month ago    Pain Frequency  Constant    Aggravating Factors   sitting and walking    Pain Relieving Factors  rest helps a little    Effect of Pain on Daily Activities  pt reports she can not reach, bend or turn due to the pain and has an aide that comes in 3 hrs daily         [facility] PT Assessment - 07/27/17 0001      Assessment   Medical Diagnosis  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  2 pt states she was walking and lost her balance    Has the patient had a decrease in activity level because of a fear of falling?   Yes    Is the patient reluctant to leave their home because of a fear of falling?   Yes      [place]  Private residence    Living Arrangements  Alone    Available Help at Discharge  Personal care attendant 3 hrs/day- assist with bathing and washing hair, laundry, et    Type of [place] to enter    Entrance Stairs-Number of Steps  1    [place]  One level  [place] - 4 wheels;[place] - 2 wheels      Prior Function   Level of Independence  Needs  assistance with ADLs;Needs assistance with homemaking aide 3 hrs/day 7 days/week for ADLs and housework    Vocation  On disability    Leisure  pt states she does arm exercises occasionally but the leg exercises hurt her back      Cognition   Overall Cognitive Status  Within Functional Limits for tasks assessed      Observation/Other Assessments   Observations  --    Other Surveys   Other Surveys      Posture/Postural Control   Posture/Postural Control  Postural limitations    Postural Limitations  Flexed trunk;Rounded Shoulders severely    Posture Comments  appears unable to stand up straight      AROM   Overall AROM Comments  per visual estimate L flexion 90 degrees R 120, abduction R 160 L 90 pt reports she had surgery on left shoulder       Strength   Overall Strength  Unable to assess;Due to pain    Overall Strength Comments  pt has pain with any hip flexion bilaterally      Palpation   Palpation comment  increased scar tissue in R breast with tenderness      Ambulation/Gait   Ambulation/Gait  Yes    Ambulation/Gait Assistance  6: Modified independent (Device/Increase time)    Assistive device  Rolling walker;Rollator FWW at home, rollator in community        LYMPHEDEMA/ONCOLOGY QUESTIONNAIRE - 07/27/17 1457      Lymphedema Assessments   Lymphedema Assessments  Upper extremities;Lower extremities      Right Upper Extremity Lymphedema   15 cm Proximal to Olecranon Process  52.8 cm    10 cm Proximal to Olecranon Process  49.4 cm    Olecranon Process  34 cm    10 cm Proximal to Ulnar Styloid Process  28.5 cm    Just Proximal to Ulnar Styloid Process  20.8 cm    Across Hand at [employer]  22 cm    At [place] of 2nd Digit  7.3 cm      Left Upper Extremity Lymphedema   15 cm Proximal to Olecranon Process  50.5 cm    10 cm Proximal to Olecranon Process  45.5 cm    Olecranon Process  35.5 cm    10 cm Proximal to Ulnar Styloid Process  28.1 cm    Just Proximal to  Ulnar Styloid Process  21 cm    Across Hand at [employer]  21.7 cm    At [place] of 2nd Digit  7.5 cm      Right Lower Extremity Lymphedema   20 cm Proximal to Suprapatella  86 cm    10 cm Proximal to Suprapatella  82 cm    At Midpatella/Popliteal Crease  50.5 cm    30 cm Proximal to Floor at Lateral Plantar Foot  55.5 cm    20 cm Proximal to Floor at Lateral Plantar Foot  43 1    10 cm Proximal to Floor at Lateral Malleoli  33 cm    5 cm Proximal to 1st MTP Joint  24.8 cm    Across MTP Joint  24.2 cm    Around Proximal Great Toe  9.5 cm      Left Lower Extremity Lymphedema   20 cm Proximal to Suprapatella  82 cm  10 cm Proximal to Suprapatella  77 cm    At Midpatella/Popliteal Crease  48.5 cm    30 cm Proximal to Floor at Lateral Plantar Foot  51.5 cm    20 cm Proximal to Floor at Lateral Plantar Foot  40.3 cm    10 cm Proximal to Floor at Lateral Malleoli  28 cm    5 cm Proximal to 1st MTP Joint  25.8 cm    Across MTP Joint  24.9 cm    Around Proximal Great Toe  9.2 cm             Outpatient Rehab [facility]/27/2017 [facility]  Lymphedema Life Impact Scale Total Score  32.35 %      Objective measurements completed on examination: See above findings.                   PT Long Term Goals - 07/27/17 1734      PT LONG TERM GOAL #1   Title  Pt will receive appropriate compression garments for long term management of RUE and RLE lymphedema    Time  4    Period  Weeks    Status  New    Target Date  08/24/17      PT LONG TERM GOAL #2   Title  Pt will be able to independently manage her lymphedema through compression garments and MLD    Time  4    Period  Weeks    Status  New    Target Date  08/24/17        Long Term Clinic Goals - 08/25/16 1526      CC Long Term Goal  #1   Title  Patient will be independent in self-manual lymph drainage for right breast and right UE.    Baseline  08/25/16- pt reports she  feels okay with this    Time  4    Period  Weeks    Status  Achieved      CC Long Term Goal  #2   Title  Patient will report at least 40% improvement in perception of hard spots and discomfort in right breast.    Baseline  08/25/16- 100% improvement in hardness, no improvememt in discomfort    Time  4    Period  Weeks    Status  Partially Met      CC Long Term Goal  #3   Title  Pt. will be knowledgeable about compression garments to assist with management of lymphedema (bras and possibly a sleeve).    Baseline  08/25/16- pt has received compression bra and is wearing daily and a night time garment has been ordered for pt's arm    Time  4    Period  Weeks    Status  Achieved      CC Long Term Goal  #4   Title  lymphedema life impact scale score reduced to impairment rating of 20% or less    Baseline  score of 21 = 31% impairment at eval, 08/25/16- 71% today despite pt reporting improvements since time of evaluation    Time  4    Period  Weeks    Status  Not Met      CC Long Term Goal  #5   Title  Pt will be independent in a home exercise program for LE strengthening and UE strenthening and ROM    Baseline  08/25/16- pt states she has these  exercises from home health for UE and LE in sitting which doesnt cause increased pain and she feels independent with them    Time  4    Period  Weeks    Status  Achieved          Plan - 07/27/17 1719    Clinical Impression Statement  Pt known to this clinic previously. She was seen last year for right breast and right UE lymphedema. She was referred back to this clinic for R LE lymphedema mainly at thigh and R UE lymphedema. She does have some swelling in her RUE compared to left. Her breast swelling has been well controlled though she does have an area of tenderness most likely scar tissue that she was educated to follow up with the doctor about. Pt does present with increased swelling in RLE when compared to LLE. Pt reports haveing to purchase pants  in a larger size to accomodate the swelling in her right thigh. Pt would benefit from skilled PT services for MLD to RLE and RUE, and to assist pt with obtaining appropriate compression garments for long term management of swelling and also scar massage to right lumpectomy scar. Pt did report 2 falls in March of 2019 but will not address LE strengthening due to pt's long standing history of chronic back pain and back surgery she is unable to tolerate LE movement and strengthening.     History and Personal Factors relevant to plan of care:  pt is right handed, has difficulty with movement due to long standing history of multiple back surgeries and chronic back pain, pt does not drive, has aide 3 hrs a day, stage 3 CKD, schizophrenia    Clinical Presentation  Evolving    Clinical Presentation due to:  recent onset of swelling in RLE - negative for DVT    Clinical Decision Making  Moderate    Rehab Potential  Good    Clinical Impairments Affecting Rehab Potential  hx of radiation, severe pain due to herniated discs in back    PT Frequency  1x / week    PT Duration  4 weeks    PT Treatment/Interventions  ADLs/Self Care Home Management;DME Instruction;Therapeutic exercise;Patient/family education;Orthotic Fit/Training;Manual techniques;Manual lymph drainage;Compression bandaging;Scar mobilization;Passive range of motion;Taping;Therapeutic activities    PT Next Visit Plan  MLD to RLE, discuss compression garments, send Rx to dr and contact [facility] [place] about garments    Consulted and Agree with Plan of Care  Patient       Patient will benefit from skilled therapeutic intervention in order to improve the following deficits and impairments:  Increased edema, Decreased knowledge of precautions, Decreased knowledge of use of DME, Pain, Decreased range of motion, Decreased strength  Visit Diagnosis: Lymphedema, not elsewhere classified  Difficulty in walking, not elsewhere classified     Problem List Patient  Active Problem List   Diagnosis Date Noted  . Spinal stenosis 06/09/2017  . HNP (herniated nucleus pulposus), lumbar 12/05/2016  . 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 Hypocalcemia 08/22/2016  . Gait abnormality 05/29/2016  . Encephalopathy acute 04/23/2016  . Hyponatremia 04/23/2016  . Schizophrenia, paranoid ([place])   . Aspiration pneumonia of left lower lobe due to gastric secretions ([place])   . Projectile vomiting with nausea   . Altered mental status   . Schizophrenia ([place])   . Disorganized schizophrenia ([place])   . Chronic pain syndrome   . Acute renal failure ([place])   . Controlled diabetes mellitus type 2 with  complications ([place])   . Acute encephalopathy   . Oropharyngeal dysphagia   . Aspiration pneumonia of right lower lobe due to vomit ([place])   . Acute renal failure (ARF) ([place]) 03/28/2016  . Encephalopathy 03/27/2016  . HCAP (healthcare-associated pneumonia) 02/29/2016  . Pneumonia 02/14/2016  . CAP (community acquired pneumonia) 02/13/2016  . Edema 02/13/2016  . AKI (acute kidney injury) ([place]) 02/13/2016  . Elevated troponin 06/24/2015  . Diarrhea 06/23/2015  . Sepsis ([place]) 06/23/2015  . Morbid (severe) obesity due to excess calories ([place]) 03/26/2015  . Post traumatic stress disorder (PTSD) 11/14/2014  . Leukocytosis 11/12/2014  . Slurred speech 11/12/2014  . CKD (chronic kidney disease) stage 3, GFR 30-59 ml/min ([facility])   . Diabetes mellitus, type II, insulin dependent ([place])   . TIA (transient ischemic attack)   . Hypotension 10/28/2014  . Syncope 10/28/2014  . Anemia 10/28/2014  . Type II diabetes mellitus ([place]) 10/28/2014  . OSA on CPAP 10/28/2014  . Chronic lower back pain 10/28/2014  . Polyneuropathy in diabetes(357.2) 10/28/2014  . Diabetic polyneuropathy ([place])   . Fall 08/14/2014  . Recurrent falls 08/14/2014  . Hot flashes 02/24/2014  . Arthralgia 02/24/2014  . Hair loss 02/24/2014  . Chest pain 01/06/2014  . Shock ([place]) 08/08/2013  .  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 Dysarthria 12/30/2012  . Benign paroxysmal positional vertigo 12/30/2012  . Breast cancer of upper-outer quadrant of right female breast ([place]) 11/18/2012  . Hemorrhage of rectum and anus 09/28/2012  . Hx of radiation therapy   . Syncope and collapse 06/04/2011  . HSV 10/05/2008  . Mild persistent asthma 06/02/2008  . HLD (hyperlipidemia) 06/02/2006  . Sinus tachycardia 05/25/2006  . Hypothyroidism 02/27/2006  . Depression 02/27/2006  . Essential hypertension 02/27/2006  . GERD 02/27/2006  . KNEE PAIN 02/27/2006    Allyson Sabal [facility] 07/27/2017, 5:35 PM  [place], [place], [address] Phone: [phone]   Fax:  [phone]  Name: [name] MRN: [id number] Date of Birth: Dec 01, 1955  Manus Gunning, PT 07/27/17 5:36 PM

## 2017-07-29 ENCOUNTER — Encounter (INDEPENDENT_AMBULATORY_CARE_PROVIDER_SITE_OTHER): Payer: Self-pay | Admitting: Orthopaedic Surgery

## 2017-07-29 ENCOUNTER — Ambulatory Visit (INDEPENDENT_AMBULATORY_CARE_PROVIDER_SITE_OTHER): Payer: Medicare Other | Admitting: Orthopaedic Surgery

## 2017-07-29 VITALS — BP 178/100 | HR 103 | Ht 60.0 in | Wt 335.0 lb

## 2017-07-29 DIAGNOSIS — M48062 Spinal stenosis, lumbar region with neurogenic claudication: Secondary | ICD-10-CM | POA: Diagnosis not present

## 2017-07-29 NOTE — Progress Notes (Signed)
Office Visit Note   Patient: [name]           Date of Birth: Jan 05, 1956           MRN: [id number] Visit Date: 07/29/2017              Requested by: Levin Bacon, NP [place], [place] [address] PCP: Levin Bacon, NP   Assessment & Plan: Visit Diagnoses:  1. Spinal stenosis of lumbar region with neurogenic claudication     Plan: Patient has multilevel disc bulges with multifactorial spinal stenosis at L3-4 which is progressed over the last few years on serial MRI scan.  Previous microdiscectomy L5-S1 with early disc recurrence similar in appearance on the left side.  She also has tightness at L4-5.  We discussed she could consider a spinal cord trial stimulator to see how that does.  She states the TENS unit makes her feel much worse.  We discussed looking in the [facility] where she can get in the pool and do some pool therapy.  Patient has problems with stairs will need to find a pool with a lift.  She discussed problems she has with cold water and states she like to find a place that had a heated pool as well.  She can discussed spinal cord stimulator further with Dr. spine IV.  I can follow-up here on a as needed basis.  Follow-Up Instructions: Return if symptoms worsen or fail to improve.   Orders:  No orders of the defined types were placed in this encounter.  No orders of the defined types were placed in this encounter.     Procedures: No procedures performed   Clinical Data: No additional findings.   Subjective: Chief Complaint  Patient presents with  . Lower Back - Pain, Follow-up    HPI 62 year old female with morbid obesity previous left L5-S1 microdiscectomy 12/05/2016 with follow-up scan showing prominent disc recurrence as well as progression of central spinal stenosis at L3-4 and to lesser degree L4-5.  Patient is using a rolling walker with reverse seat.  Patient states she is had injections with Dr. Vira Blanco and he is recommended  considering a spinal cord stimulator.  Patient continues to have problems with pain with standing and with ambulation.  Multiple medical problems including diabetic neuropathy, stage III kidney disease, schizophrenia with secondary Parkinson's, depression hypertension.  She remains on oxycodone 10 mg which is currently prescribed 6 times a day.  Review of Systems 14 point review of systems updated and unchanged from 04/28/2017 office visit other than as mentioned in HPI.  Objective: Vital Signs: BP (!) 178/100   Pulse (!) 103   Ht 5' (1.524 m)   Wt (!) 335 lb (152 kg)   BMI 65.43 kg/m   Physical Exam  Constitutional: She is oriented to person, place, and time. She appears well-developed.  HENT:  Head: Normocephalic.  Right Ear: External ear normal.  Left Ear: External ear normal.  Slurred speech  Eyes: Pupils are equal, round, and reactive to light.  Neck: No tracheal deviation present. No thyromegaly present.  Cardiovascular: Normal rate.  Pulmonary/Chest: Effort normal.  Abdominal: Soft.  Neurological: She is alert and oriented to person, place, and time.  Skin: Skin is warm and dry.  Psychiatric:  Flat affect.  Conversant and cooperative.     Ortho Exam no rash over exposed skin.  Difficulty getting from sitting standing she uses a rolling walker with reverse seat.  30 degrees internal  and external rotation both hips without pain knees reach full extension.  Specialty Comments:  No specialty comments available.  Imaging: No results found.   PMFS History: Patient Active Problem List   Diagnosis Date Noted  . Spinal stenosis 06/09/2017  . HNP (herniated nucleus pulposus), lumbar 12/05/2016  . 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 Hypocalcemia 08/22/2016  . Gait abnormality 05/29/2016  . Encephalopathy acute 04/23/2016  . Hyponatremia 04/23/2016  . Schizophrenia, paranoid ([place])   . Aspiration pneumonia of left lower lobe due to gastric secretions  ([place])   . Projectile vomiting with nausea   . Altered mental status   . Schizophrenia ([place])   . Disorganized schizophrenia ([place])   . Chronic pain syndrome   . Acute renal failure ([place])   . Controlled diabetes mellitus type 2 with complications ([place])   . Acute encephalopathy   . Oropharyngeal dysphagia   . Aspiration pneumonia of right lower lobe due to vomit ([place])   . Acute renal failure (ARF) ([place]) 03/28/2016  . Encephalopathy 03/27/2016  . HCAP (healthcare-associated pneumonia) 02/29/2016  . Pneumonia 02/14/2016  . CAP (community acquired pneumonia) 02/13/2016  . Edema 02/13/2016  . AKI (acute kidney injury) ([place]) 02/13/2016  . Elevated troponin 06/24/2015  . Diarrhea 06/23/2015  . Sepsis ([place]) 06/23/2015  . Morbid (severe) obesity due to excess calories ([place]) 03/26/2015  . Post traumatic stress disorder (PTSD) 11/14/2014  . Leukocytosis 11/12/2014  . Slurred speech 11/12/2014  . CKD (chronic kidney disease) stage 3, GFR 30-59 ml/min (HCC)   . Diabetes mellitus, type II, insulin dependent ([place])   . TIA (transient ischemic attack)   . Hypotension 10/28/2014  . Syncope 10/28/2014  . Anemia 10/28/2014  . Type II diabetes mellitus ([place]) 10/28/2014  . OSA on CPAP 10/28/2014  . Chronic lower back pain 10/28/2014  . Polyneuropathy in diabetes(357.2) 10/28/2014  . Diabetic polyneuropathy ([place])   . Fall 08/14/2014  . Recurrent falls 08/14/2014  . Hot flashes 02/24/2014  . Arthralgia 02/24/2014  . Hair loss 02/24/2014  . Chest pain 01/06/2014  . Shock ([place]) 08/08/2013  . Secondary parkinsonism ([place]) 12/30/2012  . Dysarthria 12/30/2012  . Benign paroxysmal positional vertigo 12/30/2012  . Breast cancer of upper-outer quadrant of right female breast ([place]) 11/18/2012  . Hemorrhage of rectum and anus 09/28/2012  . Hx of radiation therapy   . Syncope and collapse 06/04/2011  . HSV 10/05/2008  . Mild persistent asthma 06/02/2008  . HLD (hyperlipidemia) 06/02/2006  . Sinus  tachycardia 05/25/2006  . Hypothyroidism 02/27/2006  . Depression 02/27/2006  . Essential hypertension 02/27/2006  . GERD 02/27/2006  . KNEE PAIN 02/27/2006   Past Medical History:  Diagnosis Date  . Anemia   . Anxiety   . Arthritis    "pretty much all my joints"  . Asthma   . Benign paroxysmal positional vertigo 12/30/2012  . 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  . Breast mass in female    L breast 2008, US showed likely fat necrosis  . Chronic lower back pain   . CKD (chronic kidney disease), stage III ([place])    "lower stage" (01/06/2014)  . Daily headache    last time was Sep 21, 2016 (brother passed away)  . Depression   . Dyspnea   . Gait abnormality 05/29/2016  . GERD (gastroesophageal reflux disease)   . History of blood transfusion 2011   "after one of my OR's"  . History  of hiatal hernia   . History of stomach ulcers   . HSV (herpes simplex virus) infection   . Hx of radiation therapy 05/05/12- 07/15/12   right breast, 45 gray x 25 fx, lumpectomy cavity boosted to 16.2 gray  . Hyperlipemia   . Hypertension    sees Dr. Criss Rosales , [facility] [place]  . Hypothyroidism   . Kidney stones   . Knee pain, bilateral   . Migraines    "~ 3 times/month" (01/06/2014)  . Obesity   . OSA on CPAP    pt does not know settings  . Pancreatitis    hx of  . Personal history of radiation therapy 02/2012  . Pneumonia 1950's  . Polyneuropathy in diabetes(357.2)   . Schizophrenia ([place])   . 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 Tachycardia    with sx in 2008  . Thyroid disease   . Type II diabetes mellitus ([place])    dx 2014    Family History  Problem Relation Age of Onset  . Heart disease Brother        Multiple MIs, starting in his 68s  . Diabetes Brother   . Thyroid disease Brother   . Hypertension Mother   . Diabetes Mother   . Breast cancer Mother 39  . Bone cancer Mother   . Hypertension Sister   . Diabetes Sister    . Breast cancer Sister 43  . Thyroid disease Sister   . Breast cancer Maternal Grandmother   . Heart disease Maternal Grandmother   . Uterine cancer Other 19  . Breast cancer Paternal Aunt 82  . Breast cancer Paternal Grandmother        dx in her 41s  . Prostate cancer Paternal Grandfather   . Asthma Son     Past Surgical History:  Procedure Laterality Date  . ABDOMINAL HYSTERECTOMY  1979?   partial  . benign cyst  Left    removed from left breast  . BREAST BIOPSY Right 02/13/2012  . BREAST LUMPECTOMY  03/03/2012   Procedure: LUMPECTOMY;  Surgeon: Haywood Lasso, MD;  Location: [facility];  Service: General;  Laterality: Right;  . BREAST SURGERY Right 01/2012   "cancer"  . CHOLECYSTECTOMY  1980's?  . COLONOSCOPY WITH PROPOFOL N/A 09/28/2012   Procedure: COLONOSCOPY WITH PROPOFOL;  Surgeon: Lear Ng, MD;  [facility];  Service: Endoscopy;  Laterality: N/A;  . FOOT FRACTURE SURGERY Right 1990's  . JOINT REPLACEMENT      x 3  . LUMBAR LAMINECTOMY/DECOMPRESSION MICRODISCECTOMY N/A 12/05/2016   Procedure: LEFT L5-S1 MICRODISCECTOMY;  Surgeon: Marybelle Killings, MD;  Location: [facility];  Service: Orthopedics;  Laterality: N/A;  . REVISION TOTAL KNEE ARTHROPLASTY  07/2009  . SHOULDER OPEN ROTATOR CUFF REPAIR Left 1990's  . THYROIDECTOMY  1970's  . TOTAL KNEE ARTHROPLASTY Bilateral 2009-06/2009   left; right   Social History   Occupational History  . Occupation: Disabled  Tobacco Use  . Smoking status: Former Smoker    Packs/day: 1.00    Years: 7.00    Pack years: 7.00    Types: Cigarettes    Last attempt to quit: 03/24/1992    Years since quitting: 25.3  . Smokeless tobacco: Never Used  Substance and Sexual Activity  . 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 Drug use: No  . Sexual activity: Never    Birth control/protection: Surgical

## 2017-08-03 ENCOUNTER — Encounter: Payer: Self-pay | Admitting: Physical Therapy

## 2017-08-03 ENCOUNTER — Ambulatory Visit: Payer: Medicare Other | Admitting: Physical Therapy

## 2017-08-03 DIAGNOSIS — R262 Difficulty in walking, not elsewhere classified: Secondary | ICD-10-CM

## 2017-08-03 DIAGNOSIS — I890 Lymphedema, not elsewhere classified: Secondary | ICD-10-CM

## 2017-08-03 NOTE — Therapy (Signed)
[place], [place], [address] Phone: [phone]   Fax:  [phone]  Physical Therapy Treatment  Patient Details  Name: [name] MRN: [id number] Date of Birth: 08-14-1955 Referring Provider: Magrinat   Encounter Date: 08/03/2017  PT End of Session - 08/03/17 6734    Visit Number  2    Number of Visits  5    Date for PT Re-Evaluation  08/24/17    PT Start Time  1504    PT Stop Time  1545    PT Time Calculation (min)  41 min    Activity Tolerance  Patient tolerated treatment well    Behavior During Therapy  [facility] for tasks assessed/performed       Past Medical History:  Diagnosis Date  . Anemia   . Anxiety   . Arthritis    "pretty much all my joints"  . Asthma   . Benign paroxysmal positional vertigo 12/30/2012  . Breast cancer ([place]) 02/13/12   ruq  100'clock bx Ductal Carcinoma in Situ,(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  . Breast mass in female    L breast 2008, US showed likely fat necrosis  . Chronic lower back pain   . CKD (chronic kidney disease), stage III ([place])    "lower stage" (01/06/2014)  . Daily headache    last time was 2016/10/09 (brother passed away)  . Depression   . Dyspnea   . Gait abnormality 05/29/2016  . GERD (gastroesophageal reflux disease)   . History of blood transfusion 2011   "after one of my OR's"  . History of hiatal hernia   . History of stomach ulcers   . HSV (herpes simplex virus) infection   . Hx of radiation therapy 05/05/12- 07/15/12   right breast, 45 gray x 25 fx, lumpectomy cavity boosted to 16.2 gray  . Hyperlipemia   . Hypertension    sees Dr. Criss Rosales , [facility] [place]  . Hypothyroidism   . Kidney stones   . Knee pain, bilateral   . Migraines    "~ 3 times/month" (01/06/2014)  . Obesity   . OSA on CPAP    pt does not know settings  . Pancreatitis    hx of  . Personal history of radiation therapy 02/2012  . Pneumonia 1950's  . Polyneuropathy in  diabetes(357.2)   . Schizophrenia ([place])   . 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 Tachycardia    with sx in 2008  . Thyroid disease   . Type II diabetes mellitus ([place])    dx 2014    Past Surgical History:  Procedure Laterality Date  . ABDOMINAL HYSTERECTOMY  1979?   partial  . benign cyst  Left    removed from left breast  . BREAST BIOPSY Right 02/13/2012  . BREAST LUMPECTOMY  03/03/2012   Procedure: LUMPECTOMY;  Surgeon: Haywood Lasso, MD;  Location: [facility];  Service: General;  Laterality: Right;  . BREAST SURGERY Right 01/2012   "cancer"  . CHOLECYSTECTOMY  1980's?  . COLONOSCOPY WITH PROPOFOL N/A 09/28/2012   Procedure: COLONOSCOPY WITH PROPOFOL;  Surgeon: Lear Ng, MD;  [facility];  Service: Endoscopy;  Laterality: N/A;  . FOOT FRACTURE SURGERY Right 1990's  . JOINT REPLACEMENT      x 3  . LUMBAR LAMINECTOMY/DECOMPRESSION MICRODISCECTOMY N/A 12/05/2016   Procedure: LEFT L5-S1 MICRODISCECTOMY;  Surgeon: Marybelle Killings, MD;  Location: [facility];  Service: Orthopedics;  Laterality: N/A;  . REVISION TOTAL KNEE  ARTHROPLASTY  07/2009  . SHOULDER OPEN ROTATOR CUFF REPAIR Left 1990's  . THYROIDECTOMY  1970's  . TOTAL KNEE ARTHROPLASTY Bilateral 2009-06/2009   left; right    There were no vitals filed for this visit.  Subjective Assessment - 08/03/17 1505    Subjective  I feel like my swelling is about the same.     Pertinent History  (1)  status post right lumpectomy on 03/03/2012 for a 2.8 cm, grade 1 ductal carcinoma in situ, estrogen receptor 100% positive, progesterone receptor 100% positive, with clear margins.DM and HTN; (2)  completed radiation therapy under the care of Dr. Sondra Come 07/15/2012.  She does not currently have her HTN meds. Fell in November and sustained a hairline fracture in some UE bone so she is wearing a velcro wrist brace currently. Is on Lyrica for neuropathy from diabetes. h/o left RTC repair in about 2000.    Patient Stated Goals   to make sure there is no lymphedema in my leg and my arm is ok and there is no more swelling in there, and to make sure this hard place in my breast is not anything bad     Currently in Pain?  Yes    Pain Score  8     Pain Location  Back    Pain Orientation  Lower                  Outpatient Re[facility]  Lymphedema Life Impact Scale Total Score  32.35 %           [facility] Adult PT Treatment/Exercise - 08/03/17 0001      Manual Therapy   Manual Therapy  Manual Lymphatic Drainage (MLD);Edema management    Edema Management  discussed different garment options to manage RLE swelling but ultimately decided that due to pt's back pain and limited mobility she would be unable to manage these, educated pt about commercially available compression leggings/capris that she may be able to manage independently    Manual Lymphatic Drainage (MLD)  short neck, 5 diaphragmatic breaths, left inguinal nods and establishment of interinguinal pathway, RLE working proximal to distal then retracing all steps                  PT Long Term Goals - 08/03/17 1559      PT LONG TERM GOAL #1   Title  Pt will receive appropriate compression garments for long term management of RUE and RLE lymphedema    Baseline  08/03/17- after discussing various types of compression garments, therapist and pt decided that the only type she would be able to manage indepedently would be commercially available compression leggings and that flat knits or velcro garments would be too difficult to don and decrease mobility, pt plans on obtaining commericially available compression leggings    Time  4    Period  Weeks    Status  Partially Met      PT LONG TERM GOAL #2   Title  Pt will be able to independently manage her lymphedema through compression garments and MLD    Baseline  08/03/17- pt plans on obtaining commercially available compression leggings and is  unable to perform self MLD and declined brining caregiver for instruction    Time  4    Period  Weeks    Status  Partially Met            Plan - 08/03/17 1554  Clinical Impression Statement  Discussed different garment options with patient today including flat knit thigh high, velcro garments and commercially available compression leggings. Pt will be unable to don/doff flat knit thigh high due to back pain and limited mobility. Her mobility would be further impaired with velcro garments and pt would also have trouble donning these. She lives alone but does have an aide for 3 hours a day but would need someone to put them on and take them off for her. She also has increased back pain with any amount of hip flexion so donning compression garments would increase her pain. Pt and therapist decided the best decision would be to try commercially available compression leggings or capris available at sport goods stores. Offered to instruct aide in self MLD technique for pt's LE but pt declined. Performed MLD today to RLE. Pt will be discharged from skilled PT services at this time and plans on purchasing commercially avalaible compression leggings.     Rehab Potential  Good    Clinical Impairments Affecting Rehab Potential  hx of radiation, severe pain due to herniated discs in back    PT Frequency  1x / week    PT Duration  4 weeks    PT Treatment/Interventions  ADLs/Self Care Home Management;DME Instruction;Therapeutic exercise;Patient/family education;Orthotic Fit/Training;Manual techniques;Manual lymph drainage;Compression bandaging;Scar mobilization;Passive range of motion;Taping;Therapeutic activities    PT Next Visit Plan  dc this visit    Consulted and Agree with Plan of Care  Patient       Patient will benefit from skilled therapeutic intervention in order to improve the following deficits and impairments:  Increased edema, Decreased knowledge of precautions, Decreased knowledge of use of  DME, Pain, Decreased range of motion, Decreased strength  Visit Diagnosis: Lymphedema, not elsewhere classified  Difficulty in walking, not elsewhere classified     Problem List Patient Active Problem List   Diagnosis Date Noted  . Spinal stenosis 06/09/2017  . HNP (herniated nucleus pulposus), lumbar 12/05/2016  . 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 Hypocalcemia 08/22/2016  . Gait abnormality 05/29/2016  . Encephalopathy acute 04/23/2016  . Hyponatremia 04/23/2016  . Schizophrenia, paranoid ([place])   . Aspiration pneumonia of left lower lobe due to gastric secretions ([place])   . Projectile vomiting with nausea   . Altered mental status   . Schizophrenia ([place])   . Disorganized schizophrenia ([place])   . Chronic pain syndrome   . Acute renal failure ([place])   . Controlled diabetes mellitus type 2 with complications ([place])   . Acute encephalopathy   . Oropharyngeal dysphagia   . Aspiration pneumonia of right lower lobe due to vomit ([place])   . Acute renal failure (ARF) ([place]) 03/28/2016  . Encephalopathy 03/27/2016  . HCAP (healthcare-associated pneumonia) 02/29/2016  . Pneumonia 02/14/2016  . CAP (community acquired pneumonia) 02/13/2016  . Edema 02/13/2016  . AKI (acute kidney injury) ([place]) 02/13/2016  . Elevated troponin 06/24/2015  . Diarrhea 06/23/2015  . Sepsis ([place]) 06/23/2015  . Morbid (severe) obesity due to excess calories ([place]) 03/26/2015  . Post traumatic stress disorder (PTSD) 11/14/2014  . Leukocytosis 11/12/2014  . Slurred speech 11/12/2014  . CKD (chronic kidney disease) stage 3, GFR 30-59 ml/min (HCC)   . Diabetes mellitus, type II, insulin dependent ([place])   . TIA (transient ischemic attack)   . Hypotension 10/28/2014  . Syncope 10/28/2014  . Anemia 10/28/2014  . Type II diabetes mellitus ([place]) 10/28/2014  . OSA on CPAP 10/28/2014  . Chronic lower  back pain 10/28/2014  . Polyneuropathy in diabetes(357.2) 10/28/2014  . Diabetic  polyneuropathy ([place])   . Fall 08/14/2014  . Recurrent falls 08/14/2014  . Hot flashes 02/24/2014  . Arthralgia 02/24/2014  . Hair loss 02/24/2014  . Chest pain 01/06/2014  . Shock ([place]) 08/08/2013  . 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 Dysarthria 12/30/2012  . Benign paroxysmal positional vertigo 12/30/2012  . Breast cancer of upper-outer quadrant of right female breast ([place]) 11/18/2012  . Hemorrhage of rectum and anus 09/28/2012  . Hx of radiation therapy   . Syncope and collapse 06/04/2011  . HSV 10/05/2008  . Mild persistent asthma 06/02/2008  . HLD (hyperlipidemia) 06/02/2006  . Sinus tachycardia 05/25/2006  . Hypothyroidism 02/27/2006  . Depression 02/27/2006  . Essential hypertension 02/27/2006  . GERD 02/27/2006  . KNEE PAIN 02/27/2006    Allyson Sabal [facility] 08/03/2017, 4:02 PM  [place], [place], [address] Phone: [phone]   Fax:  [phone]  Name: [name] MRN: [id number] Date of Birth: May 22, 1955  PHYSICAL THERAPY DISCHARGE SUMMARY  Visits from Start of Care: 2  Current functional level related to goals / functional outcomes: See above   Remaining deficits: Still has RLE swelling   Education / Equipment: Compression garments  Plan: Patient agrees to discharge.  Patient goals were partially met. Patient is being discharged due to being pleased with the current functional level.  ?????    Allyson Sabal [place], [place] 08/03/17 4:03 PM

## 2017-08-04 ENCOUNTER — Other Ambulatory Visit: Payer: Self-pay

## 2017-08-04 ENCOUNTER — Encounter: Payer: Self-pay | Admitting: Neurology

## 2017-08-04 ENCOUNTER — Telehealth: Payer: Self-pay | Admitting: Endocrinology

## 2017-08-04 ENCOUNTER — Ambulatory Visit (INDEPENDENT_AMBULATORY_CARE_PROVIDER_SITE_OTHER): Payer: Medicare Other | Admitting: Neurology

## 2017-08-04 ENCOUNTER — Encounter

## 2017-08-04 VITALS — BP 147/93 | HR 100 | Ht 60.0 in | Wt 343.5 lb

## 2017-08-04 DIAGNOSIS — R269 Unspecified abnormalities of gait and mobility: Secondary | ICD-10-CM | POA: Diagnosis not present

## 2017-08-04 DIAGNOSIS — E538 Deficiency of other specified B group vitamins: Secondary | ICD-10-CM | POA: Diagnosis not present

## 2017-08-04 DIAGNOSIS — Z5181 Encounter for therapeutic drug level monitoring: Secondary | ICD-10-CM | POA: Diagnosis not present

## 2017-08-04 DIAGNOSIS — R413 Other amnesia: Secondary | ICD-10-CM | POA: Diagnosis not present

## 2017-08-04 MED ORDER — INSULIN DETEMIR 100 UNIT/ML FLEXPEN: 10.0000 [IU] | PEN_INJECTOR | Freq: Every day | SUBCUTANEOUS | 4 refills | Status: DC

## 2017-08-04 NOTE — Progress Notes (Signed)
Reason for visit: Tremor  [name] is an 62 y.o. female  History of present illness:  [name] is a 62 year old right-handed black female with a history of schizophrenia, morbid obesity, sleep apnea, diabetes, chronic low back pain, and a reported memory problem.  The patient apparently underwent lumbosacral spine surgery by Dr. Lorin Mercy in October 2018.  The patient has had no improvement in her back pain, she believes her pain is worse.  She has significant discomfort with sitting and standing, she feels better lying down.  She is followed through the [facility], she gets injections in her back.  She reports right-sided sciatica.  She has sleep apnea, she is followed through this office for this reason.  She remains morbidly obese.  She lives alone but she has an aid who comes in and helps her during the day.  The patient reports that over the last year and a half her memory has been failing, she has had increased fatigue that has paralleled the memory problem.  A CT scan of the brain was done in March 2018 when she was admitted for lethargy and confusion felt secondary to polypharmacy.  CT of the brain at that time was normal.  The patient was initially referred to this office for evaluation of tremor, she still has some intermittent tremor of the hands, this mainly affects her handwriting at times.  The patient remains on Geodon 20 mg daily.  She returns for an evaluation.  Past Medical History:  Diagnosis Date  . Anemia   . Anxiety   . Arthritis    "pretty much all my joints"  . Asthma   . Benign paroxysmal positional vertigo 12/30/2012  . Breast cancer ([place]) 02/13/12   ruq  100'clock bx Ductal Carcinoma in Situ,(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  . Breast mass in female    L breast 2008, US showed likely fat necrosis  . Chronic lower back pain   . CKD (chronic kidney disease), stage III ([place])    "lower stage" (01/06/2014)  . Daily headache    last time was 08-25-2016 (brother  passed away)  . Depression   . Dyspnea   . Gait abnormality 05/29/2016  . GERD (gastroesophageal reflux disease)   . History of blood transfusion 2011   "after one of my OR's"  . History of hiatal hernia   . History of stomach ulcers   . HSV (herpes simplex virus) infection   . Hx of radiation therapy 05/05/12- 07/15/12   right breast, 45 gray x 25 fx, lumpectomy cavity boosted to 16.2 gray  . Hyperlipemia   . Hypertension    sees Dr. Criss Rosales , [facility]  . Hypothyroidism   . Kidney stones   . Knee pain, bilateral   . Migraines    "~ 3 times/month" (01/06/2014)  . Obesity   . OSA on CPAP    pt does not know settings  . Pancreatitis    hx of  . Personal history of radiation therapy 02/2012  . Pneumonia 1950's  . Polyneuropathy in diabetes(357.2)   . Schizophrenia ([place])   . 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 Tachycardia    with sx in 2008  . Thyroid disease   . Type II diabetes mellitus ([place])    dx 2014    Past Surgical History:  Procedure Laterality Date  . ABDOMINAL HYSTERECTOMY  1979?   partial  . benign cyst  Left    removed from left breast  .  BREAST BIOPSY Right 02/13/2012  . BREAST LUMPECTOMY  03/03/2012   Procedure: LUMPECTOMY;  Surgeon: Haywood Lasso, MD;  Location: [facility];  Service: General;  Laterality: Right;  . BREAST SURGERY Right 01/2012   "cancer"  . CHOLECYSTECTOMY  1980's?  . COLONOSCOPY WITH PROPOFOL N/A 09/28/2012   Procedure: COLONOSCOPY WITH PROPOFOL;  Surgeon: Lear Ng, MD;  Location: [facility];  Service: Endoscopy;  Laterality: N/A;  . FOOT FRACTURE SURGERY Right 1990's  . JOINT REPLACEMENT      x 3  . LUMBAR LAMINECTOMY/DECOMPRESSION MICRODISCECTOMY N/A 12/05/2016   Procedure: LEFT L5-S1 MICRODISCECTOMY;  Surgeon: Marybelle Killings, MD;  Location: [facility];  Service: Orthopedics;  Laterality: N/A;  . REVISION TOTAL KNEE ARTHROPLASTY  07/2009  . SHOULDER OPEN ROTATOR CUFF REPAIR Left 1990's  . THYROIDECTOMY  1970's  .  TOTAL KNEE ARTHROPLASTY Bilateral 2009-06/2009   left; right    Family History  Problem Relation Age of Onset  . Heart disease Brother        Multiple MIs, starting in his 76s  . Diabetes Brother   . Thyroid disease Brother   . Hypertension Mother   . Diabetes Mother   . Breast cancer Mother 80  . Bone cancer Mother   . Hypertension Sister   . Diabetes Sister   . Breast cancer Sister 61  . Thyroid disease Sister   . Breast cancer Maternal Grandmother   . Heart disease Maternal Grandmother   . Uterine cancer Other 19  . Breast cancer Paternal Aunt 61  . Breast cancer Paternal Grandmother        dx in her 57s  . Prostate cancer Paternal Grandfather   . Asthma Son     Social history:  reports that she quit smoking about 25 years ago. Her smoking use included cigarettes. She has a 7.00 pack-year smoking history. She has never used smokeless tobacco. She reports that she does not drink alcohol or use drugs.    Allergies  Allergen Reactions  . Bee Venom Anaphylaxis  . Shellfish Allergy Anaphylaxis  . Buprenorphine Hcl Other (See Comments)    Overly sedated  . Other Other (See Comments)    Cats - bad asthma, stopped up    Medications:  Prior to Admission medications   Medication Sig Start Date End Date Taking? Authorizing Provider  albuterol (PROAIR HFA) 108 (90 Base) MCG/ACT inhaler Inhale 2 puffs into the lungs every 4 (four) hours as needed for wheezing or shortness of breath.    Yes [provider]  aspirin EC 81 MG tablet Take 1 tablet (81 mg total) by mouth daily. 08/12/13  Yes Viyuoh, Adeline C, MD  B Complex-C (B-COMPLEX WITH VITAMIN C) tablet Take 1 tablet by mouth daily.   Yes [provider]  budesonide-formoterol (SYMBICORT) 80-4.5 MCG/ACT inhaler Inhale 2 puffs into the lungs 2 (two) times daily. Patient taking differently: Inhale 1 puff into the lungs 3 (three) times daily.  10/28/16  Yes Tanda Rockers, MD  calcitRIOL (ROCALTROL) 0.25 MCG  capsule TAKE ONE CAPSULE BY MOUTH three times WEEKLY 05/28/17  Yes Renato Shin, MD  Cholecalciferol (VITAMIN D-3) 1000 units CAPS Take 1,000 Units by mouth daily.   Yes [provider]  Cyanocobalamin (VITAMIN B-12 PO) Take 1 tablet by mouth daily.   Yes [provider]  diphenhydrAMINE (BENADRYL) 25 MG tablet Take 50 mg by mouth every 6 (six) hours as needed for itching or allergies.   Yes [provider]  doxepin (SINEQUAN) 25 MG capsule Take 50 mg by mouth at bedtime.   Yes [provider]  esomeprazole (NEXIUM) 40 MG capsule Take 1 capsule (40 mg total) by mouth daily before breakfast. Patient taking differently: Take 40 mg by mouth 2 (two) times daily before a meal.  10/30/14  Yes Barton Dubois, MD  furosemide (LASIX) 40 MG tablet Take 1 tablet (40 mg total) by mouth daily. 02/17/16  Yes Florencia Reasons, MD  glucose blood [facility] VERIO) test strip Check blood sugar 3 times daily 04/10/17  Yes Renato Shin, MD  HUMALOG KWIKPEN 100 UNIT/ML KiwkPen INJECT 10 UNITS SUBCUTANEOUSLY THREE TIMES DAILY 05/31/17  Yes Renato Shin, MD  Insulin Detemir (LEVEMIR FLEXTOUCH) 100 UNIT/ML Pen Inject 10 Units into the skin at bedtime. 01/20/17  Yes Renato Shin, MD  Iron-Vitamins (GERITOL PO) Take 1 tablet by mouth daily.   Yes [provider]  JANUMET 50-500 MG tablet Take 1 tablet by mouth 2 (two) times daily with a meal. 07/21/17  Yes Renato Shin, MD  levothyroxine (SYNTHROID, LEVOTHROID) 150 MCG tablet Take 1 tablet (150 mcg total) by mouth daily before breakfast. 09/29/16  Yes Renato Shin, MD  linaclotide [facility]) 290 MCG CAPS capsule Take 290 mcg by mouth daily before breakfast.   Yes [provider]  LORazepam (ATIVAN) 2 MG tablet Take 1-2 mg by mouth See admin instructions. Take 1 mg by mouth in the morning and take 2 mg by mouth at bedtime   Yes [provider]  losartan-hydrochlorothiazide (HYZAAR) 100-12.5 MG tablet Take 1 tablet by mouth  daily.   Yes [provider]  LYRICA 150 MG capsule Take 150 mg by mouth 3 (three) times daily.  05/20/16  Yes [provider]  methocarbamol (ROBAXIN) 500 MG tablet Take 1 tablet (500 mg total) by mouth every 8 (eight) hours as needed for muscle spasms. 02/13/17  Yes Leandrew Koyanagi, MD  Multiple Vitamin (MULTIVITAMIN WITH MINERALS) TABS tablet Take 1 tablet by mouth daily.   Yes [provider]  Oxycodone HCl 10 MG TABS TAKE ONE TABLET BY MOUTH SIX times daily 06/30/17  Yes [provider]  POTASSIUM PO Take 1 tablet by mouth daily.   Yes [provider]  rosuvastatin (CRESTOR) 20 MG tablet Take 20 mg by mouth daily.   Yes [provider]  tiZANidine (ZANAFLEX) 4 MG tablet Take 4 mg by mouth 2 (two) times daily.  03/25/16  Yes [provider]  valACYclovir (VALTREX) 1000 MG tablet Take 1,000 mg by mouth daily.   Yes [provider]  venlafaxine XR (EFFEXOR-XR) 150 MG 24 hr capsule Take 150 mg by mouth 2 (two) times daily. 05/21/15  Yes [provider]  ziprasidone (GEODON) 20 MG capsule Take 1 capsule (20 mg total) by mouth 2 (two) times daily with a meal. Patient taking differently: Take 40 mg by mouth daily.  04/25/16  Yes Ghimire, Henreitta Leber, MD    ROS:  Out of a complete 14 system review of symptoms, the patient complains only of the following symptoms, and all other reviewed systems are negative.  Fatigue, weight gain Hearing loss, ear pain, ringing in the ears, runny nose Eye itching, eye redness, double vision, eye pain, blurred vision Cough, wheezing, shortness of breath, choking, chest tightness Leg swelling Cold intolerance, heat intolerance, excessive thirst, flushing Rectal pain Restless legs, insomnia, sleep apnea, daytime sleepiness, snoring Incontinence of the bladder, frequency of urination, urinary urgency Joint pain, joint swelling, back pain, aching muscles,  muscle cramps, walking difficulty Skin  rash, itching Memory loss, dizziness, headache, numbness, weakness, tremors, facial drooping Agitation, confusion, decreased concentration, depression, anxiety, hallucinations  Blood pressure (!) 147/93, pulse 100, height 5' (1.524 m), weight (!) 343 lb 8 oz (155.8 kg).  Physical Exam  General: The patient is alert and cooperative at the time of the examination.  The patient is morbidly obese.  Skin: 2+ edema at the ankles are seen bilaterally.   Neurologic Exam  Mental status: The patient is alert and oriented x 3 at the time of the examination. The patient has apparent normal recent and remote memory, with an apparently normal attention span and concentration ability.   Cranial nerves: Facial symmetry is present. Speech is normal, no aphasia or dysarthria is noted. Extraocular movements are full. Visual fields are full.  Motor: The patient has good strength in all 4 extremities.  Sensory examination: Soft touch sensation is symmetric on the face, arms, and legs.  Coordination: The patient has good finger-nose-finger and heel-to-shin bilaterally.  No significant tremor seen on the upper extremities.  Gait and station: The patient is able to walk with a rolling walker, posture is stooped, she is unable to straighten up the spine completely.  Reflexes: Deep tendon reflexes are symmetric, but are depressed.   CT brain 04/23/16:  IMPRESSION: 1. Normal CT appearance of the brain. No acute or focal lesion to explain abnormal speech or weakness.  * CT scan images were reviewed online. I agree with the written report.    Assessment/Plan:  1.  Morbid obesity  2.  Chronic low back pain  3.  Schizophrenia  4.  History of tremor  5.  Reported memory disturbance  The patient has no observable tremor at this time.  The patient is complaining of significant problems with insomnia, fatigue, and difficulty with concentration associated with significant chronic low back pain.  I  have indicated that her primary medical problem is obesity, significant weight loss is likely to improve multiple medical issues including diabetes, back pain, sleep apnea, fatigue, memory problems, and shortness of breath.  The patient should get involved with a structured dietary program such as weight watchers.  Blood work will be done today looking for metabolic issues that may impair memory.  Previous CT scan of the brain done was normal.  The patient is followed through this office for sleep apnea.  Jill Alexanders MD 08/04/2017 8:08 AM  [facility] [address] [place],  [address]  Phone [phone] Fax [phone]

## 2017-08-04 NOTE — Telephone Encounter (Signed)
Insulin Detemir (LEVEMIR FLEXTOUCH) 100 UNIT/ML Pen    [place], [place], [place]  Patient needs refill sent into the pharmacy

## 2017-08-04 NOTE — Telephone Encounter (Signed)
I have sent to patient;'s pharmacy.  

## 2017-08-05 ENCOUNTER — Telehealth: Payer: Self-pay | Admitting: *Deleted

## 2017-08-05 LAB — COMPREHENSIVE METABOLIC PANEL
A/G RATIO: 1.7 (ref 1.2–2.2)
ALT: 16 IU/L (ref 0–32)
AST: 14 IU/L (ref 0–40)
Albumin: 4.2 g/dL (ref 3.6–4.8)
Alkaline Phosphatase: 95 IU/L (ref 39–117)
BUN/Creatinine Ratio: 16 (ref 12–28)
BUN: 20 mg/dL (ref 8–27)
Bilirubin Total: <0.2 mg/dL (ref 0.0–1.2)
CO2: 25 mmol/L (ref 20–29)
Calcium: 9.9 mg/dL (ref 8.7–10.3)
Chloride: 95 mmol/L — ABNORMAL LOW (ref 96–106)
Creatinine, Ser: 1.29 mg/dL — ABNORMAL HIGH (ref 0.57–1.00)
GFR calc Af Amer: 52 mL/min/{1.73_m2} — ABNORMAL LOW (ref >59)
GFR calc non Af Amer: 45 mL/min/{1.73_m2} — ABNORMAL LOW (ref >59)
Globulin, Total: 2.5 g/dL (ref 1.5–4.5)
Glucose: 99 mg/dL (ref 65–99)
POTASSIUM: 4.8 mmol/L (ref 3.5–5.2)
SODIUM: 137 mmol/L (ref 134–144)
Total Protein: 6.7 g/dL (ref 6.0–8.5)

## 2017-08-05 LAB — HIV ANTIBODY (ROUTINE TESTING W REFLEX): HIV SCREEN 4TH GENERATION: NONREACTIVE

## 2017-08-05 LAB — RPR: RPR: NONREACTIVE

## 2017-08-05 LAB — VITAMIN B12: VITAMIN B 12: 1236 pg/mL (ref 232–1245)

## 2017-08-05 LAB — SEDIMENTATION RATE: SED RATE: 38 mm/h (ref 0–40)

## 2017-08-05 NOTE — Telephone Encounter (Signed)
Called and spoke with pt about lab results per CW,MD note. Pt verbalized understanding.

## 2017-08-05 NOTE — Telephone Encounter (Signed)
-----  Message from Kathrynn Ducking, MD sent at 08/05/2017  7:19 AM EDT ----- Blood work is unremarkable with exception of stable chronic renal insufficiency, chronic stable slightly low chloride level.  No other clinical concerns. Please call the patient. ----- Message ----- From: Lavone Neri Lab Results In Sent: 08/05/2017   5:41 AM To: Kathrynn Ducking, MD

## 2017-08-10 IMAGING — CR DG CHEST 2V
2 series · 2 of 2 positions shown · non-contrast
Comparison: 06/24/2015

CLINICAL DATA: Cough and congestion. Weakness and body aches for 3
days.

EXAM:
CHEST  2 VIEW

[chest lat]
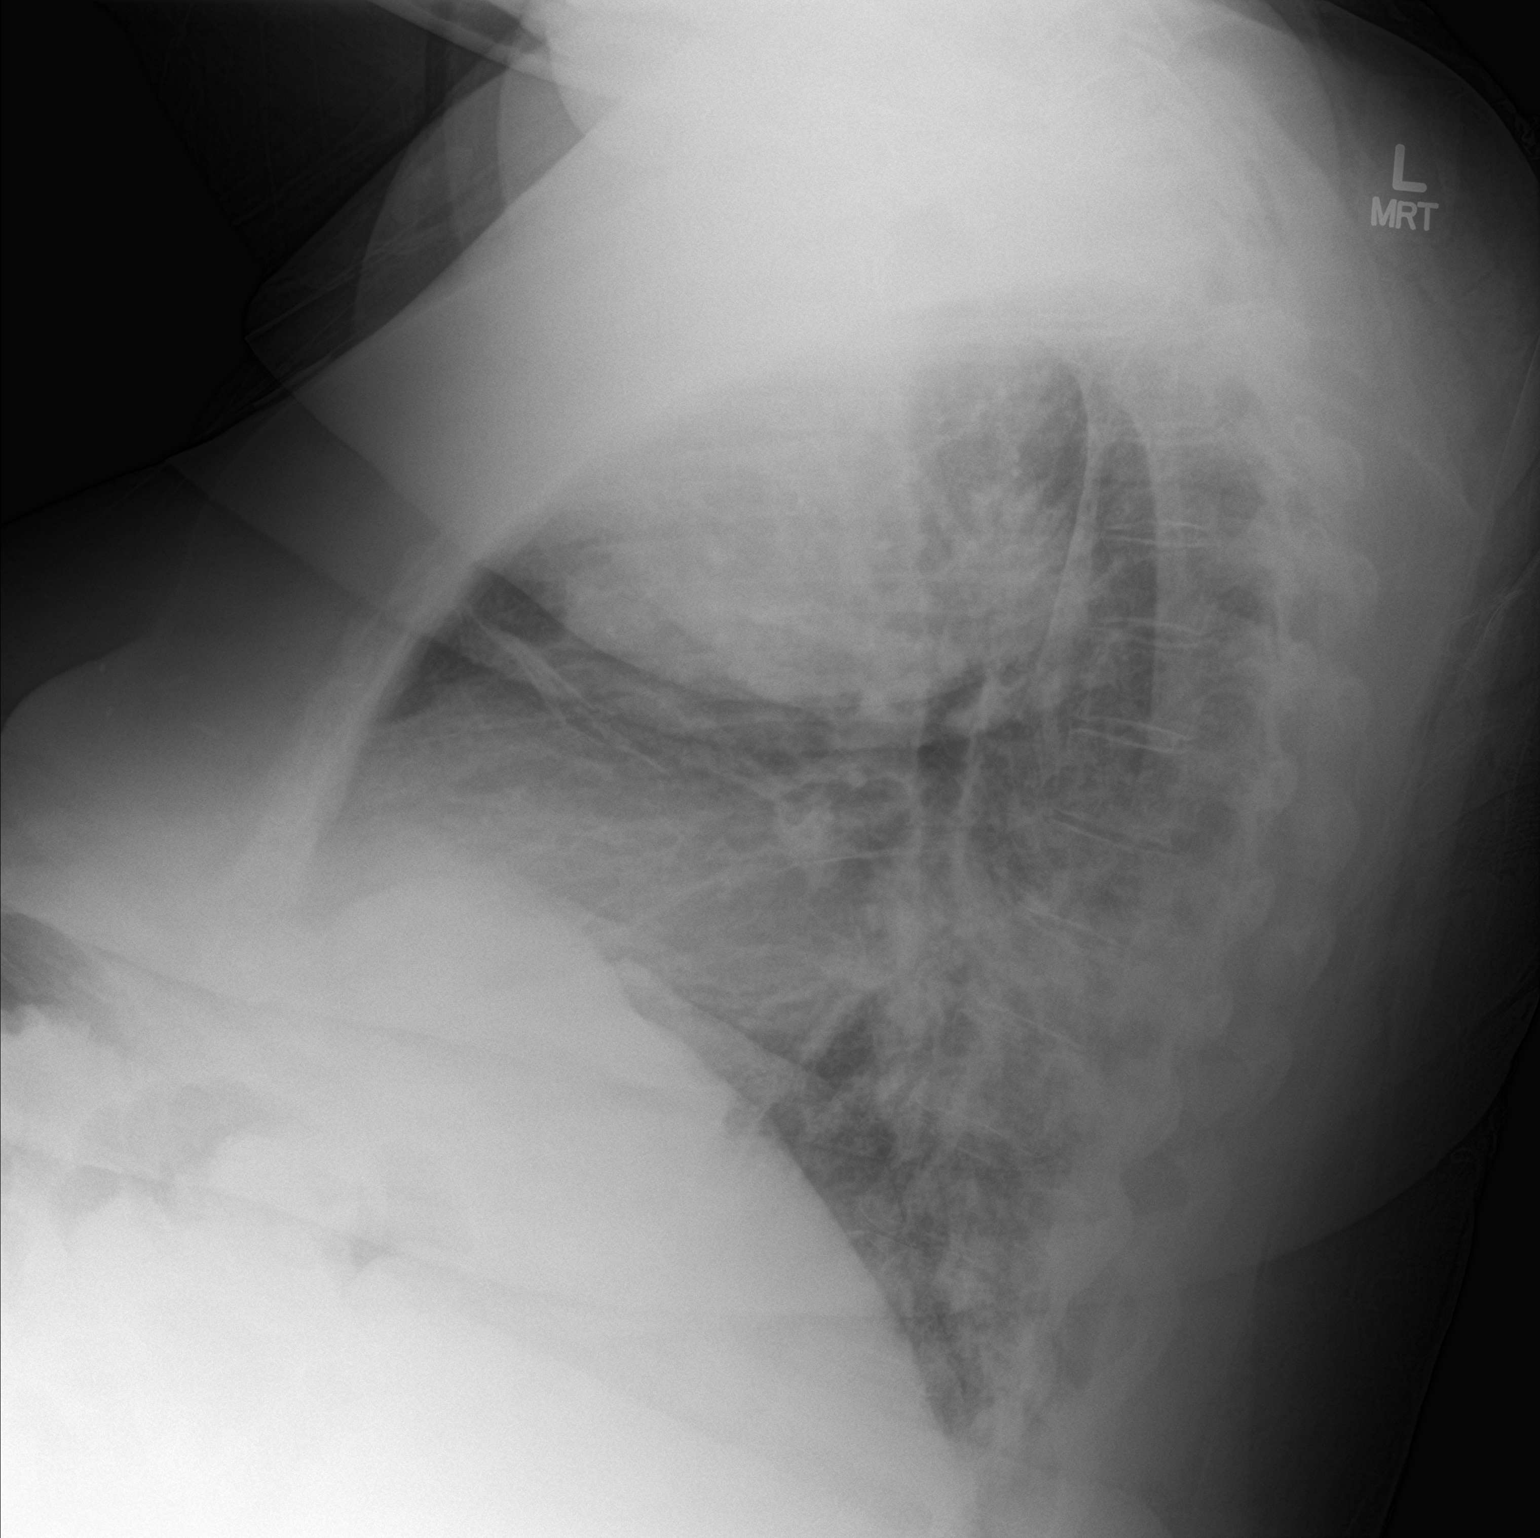

[chest ap]
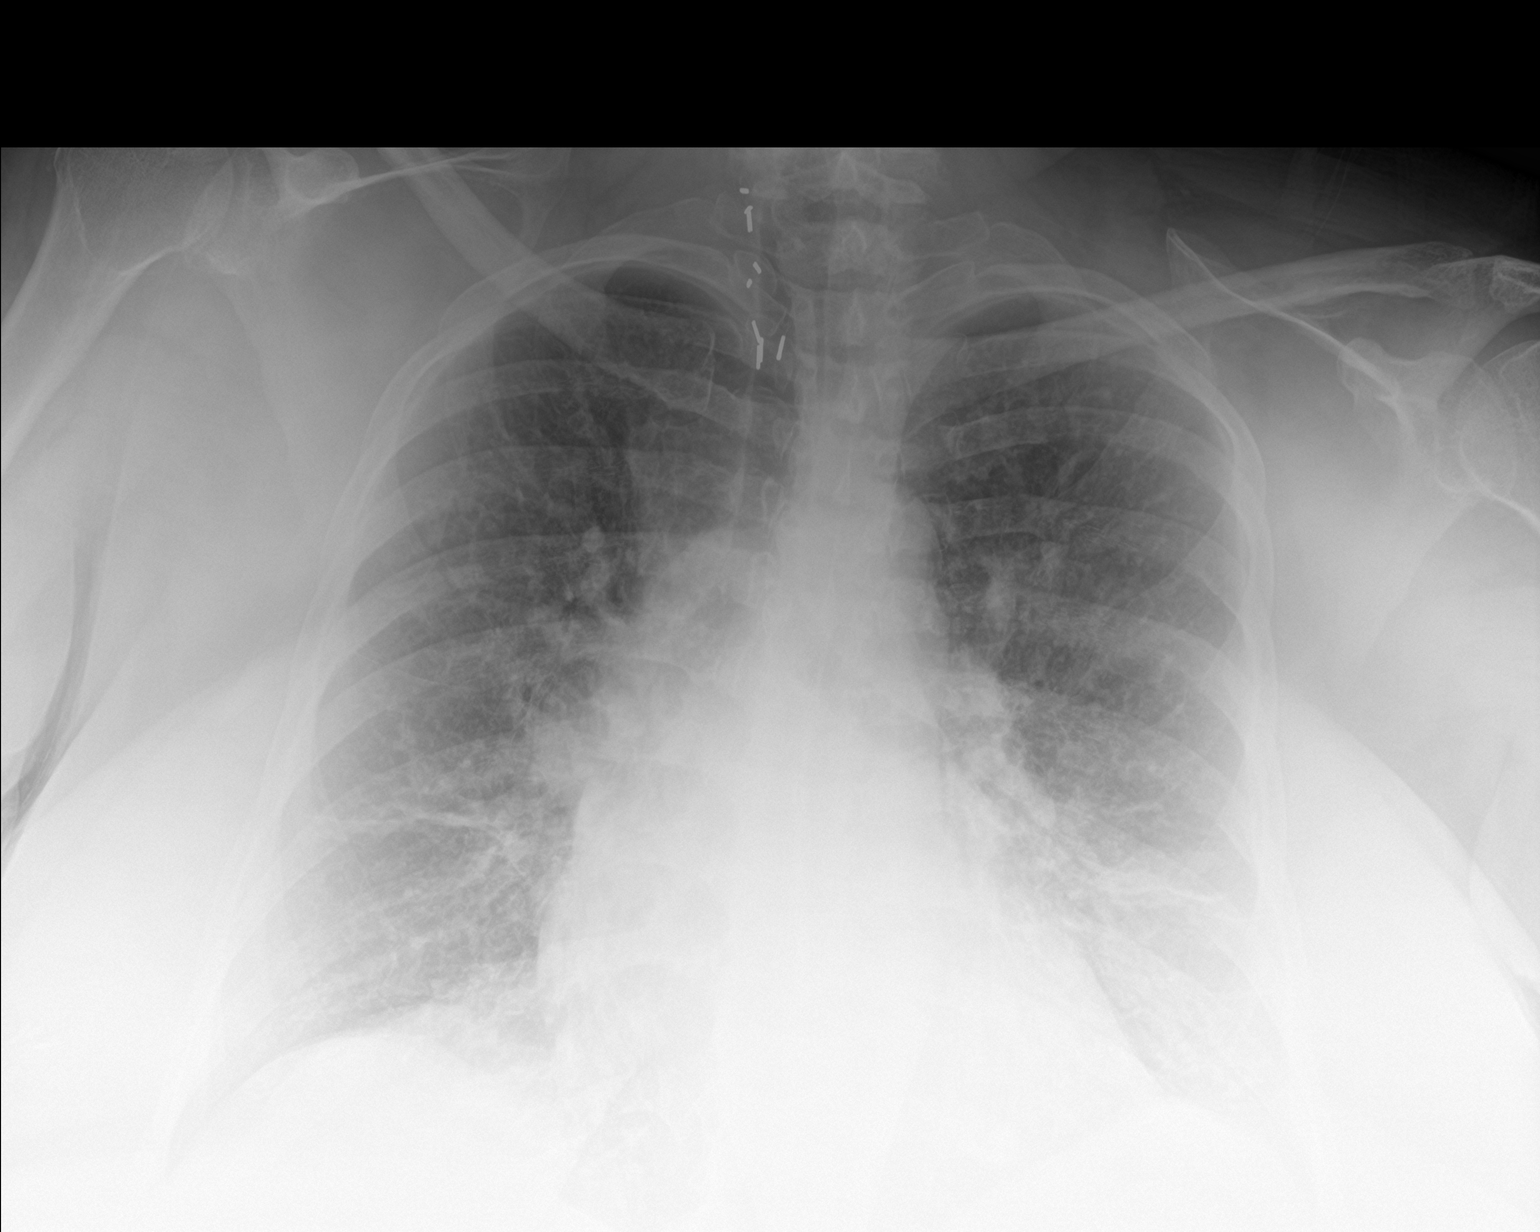

[2 of 2 positions shown; findings below may reference images not displayed]

FINDINGS: AP and lateral views of the chest show borderline cardiomegaly.
Vascular congestion noted with probable underlying chronic
interstitial changes. Patchy airspace disease noted at the lung
bases likely atelectatic although pneumonia not excluded. Surgical
clips right lower neck suggest hemithyroidectomy.
IMPRESSION: Borderline cardiomegaly with bibasilar atelectasis/ infiltrate.

## 2017-08-11 ENCOUNTER — Ambulatory Visit: Payer: Medicare Other

## 2017-08-11 IMAGING — CT CT CHEST W/O CM
2 of 5 series · 13 of 36 positions shown, 16 images · non-contrast
Comparison: Chest radiograph, 02/13/2016.  Chest CT, 08/08/2013.

CLINICAL DATA: Short of breath and cough for 2 weeks. No chest
pain.

EXAM:
CT CHEST WITHOUT CONTRAST
TECHNIQUE: Multidetector CT imaging of the chest was performed following the
standard protocol without IV contrast.

[Series 3: thorax 2.0 · axial · 0.65mm/px · z∈[+1347,+1599]mm · 10 of 156 slices shown, 13 images]
[im 15/156  mediastinal]
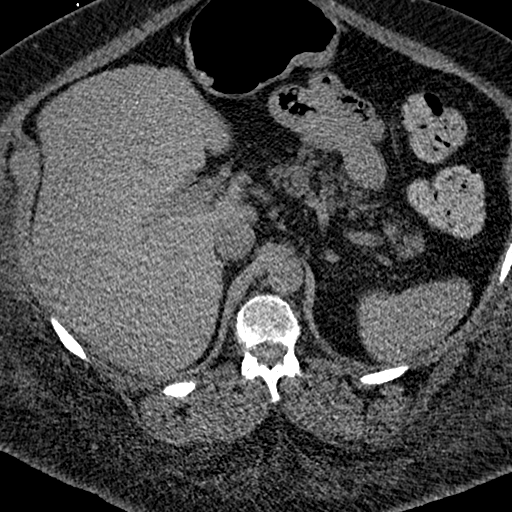
[im 15/156  lung]
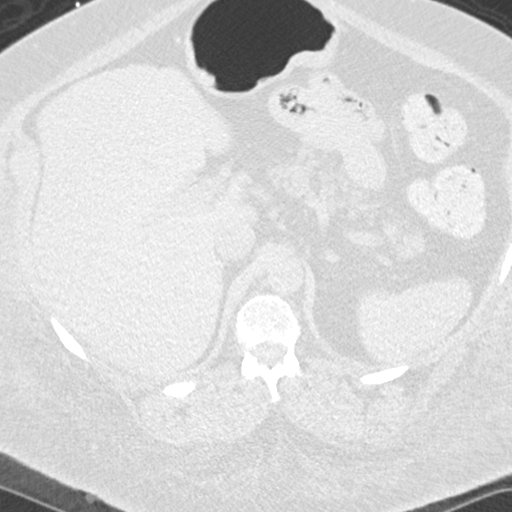
[im 29/156  lung]
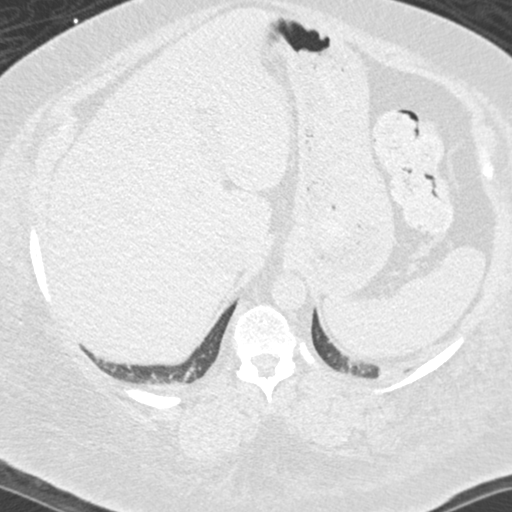
[im 43/156  lung]
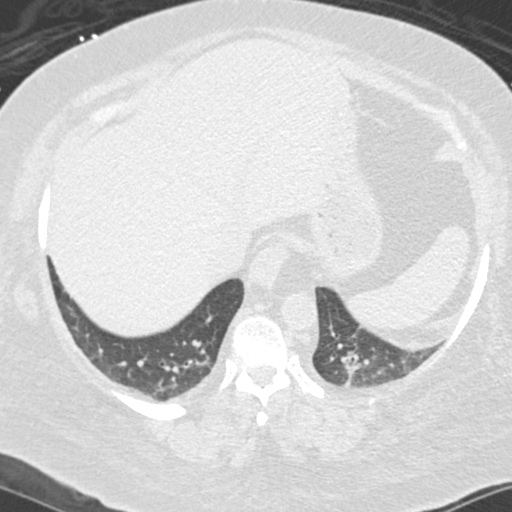
[im 57/156  lung]
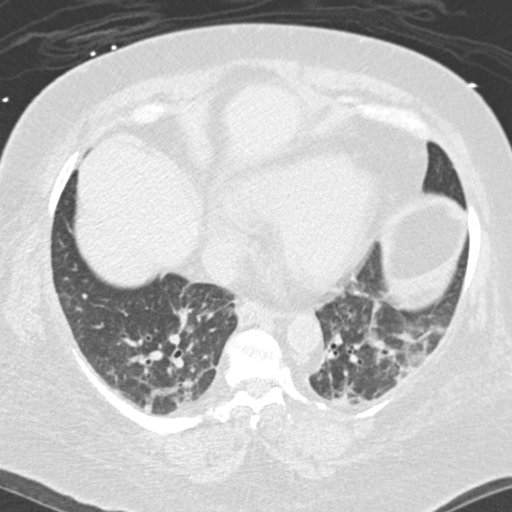
[im 71/156  mediastinal]
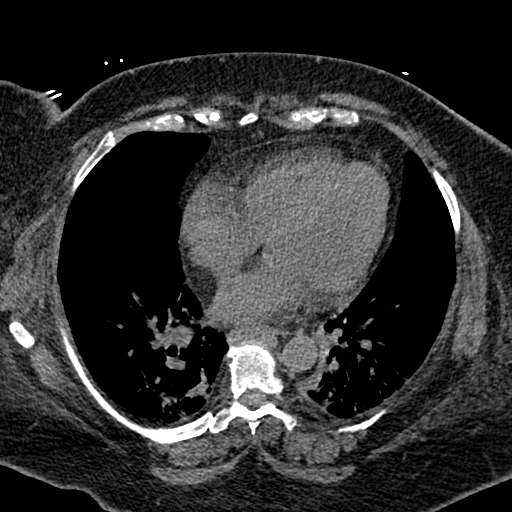
[im 71/156  lung]
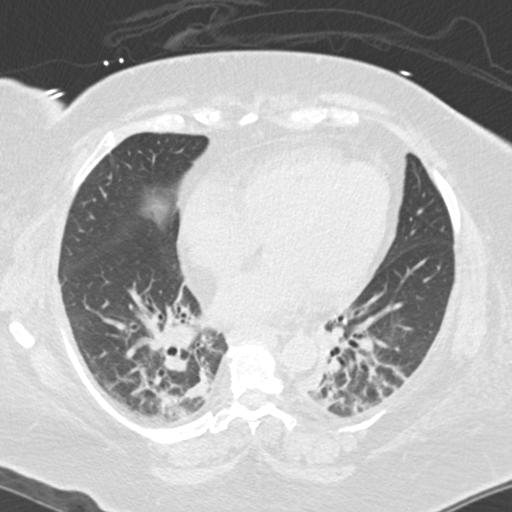
[im 85/156  lung]
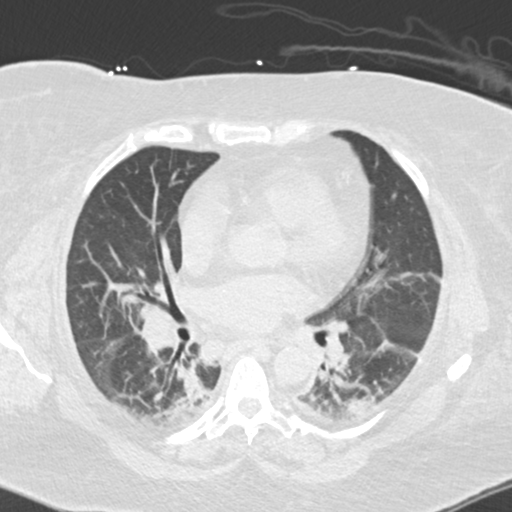
[im 99/156  lung]
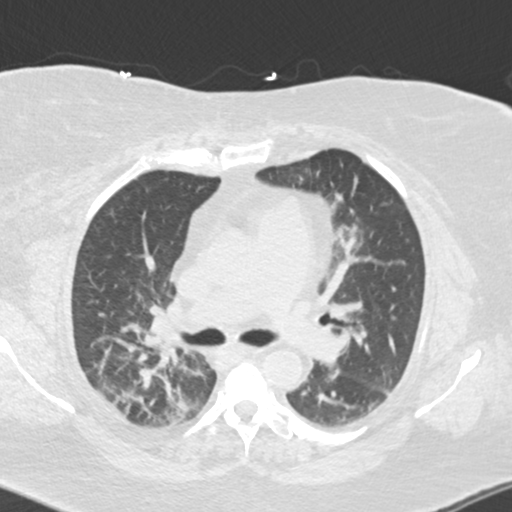
[im 113/156  lung]
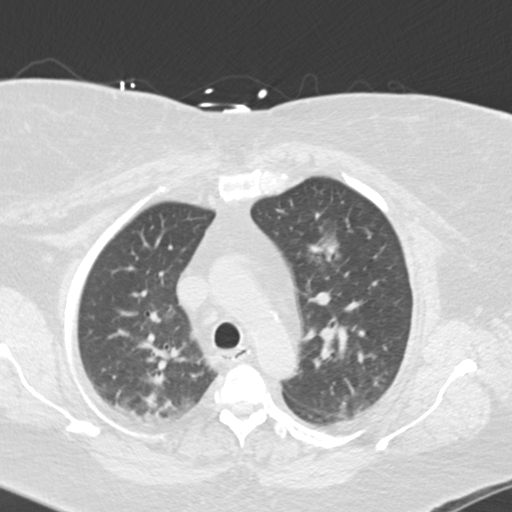
[im 127/156  mediastinal]
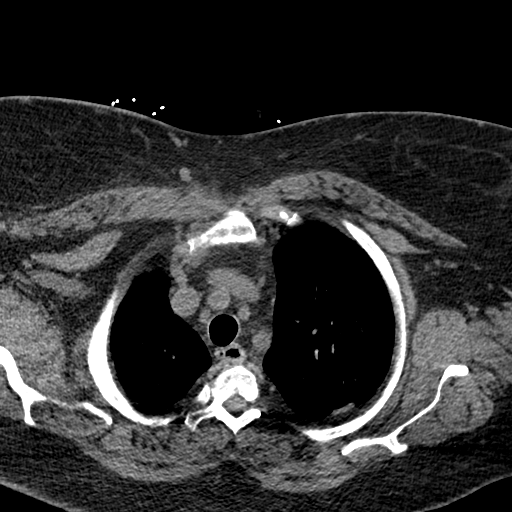
[im 127/156  lung]
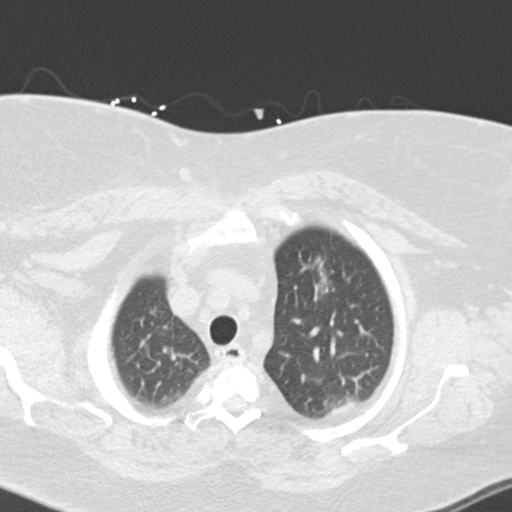
[im 141/156  lung]
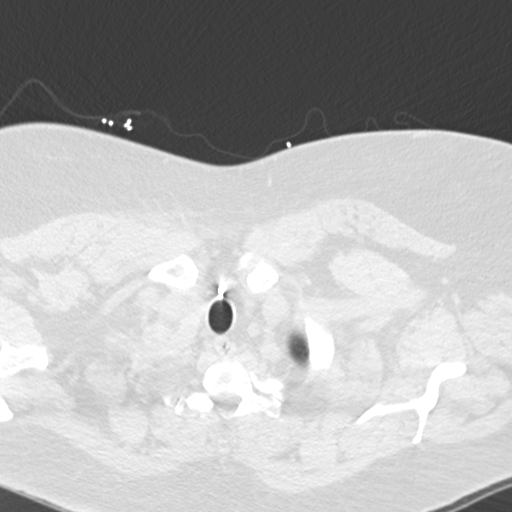

[Series 5: coronal · coronal · 0.61mm/px · 3 of 130 slices shown]
[im 26/130  lung]
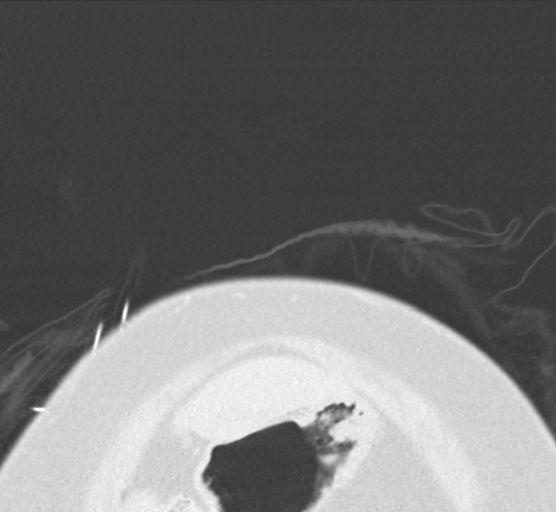
[im 52/130  lung]
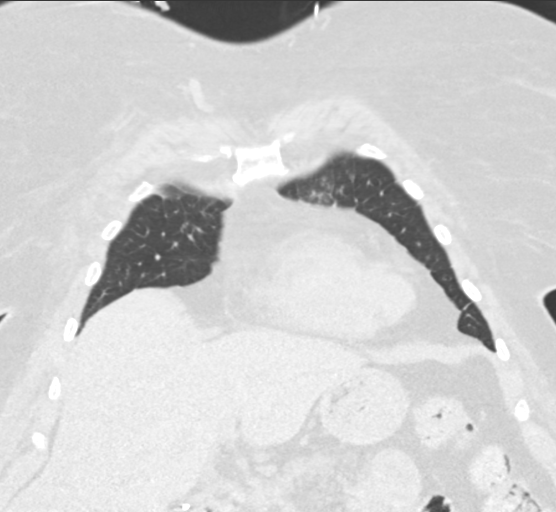
[im 78/130  lung]
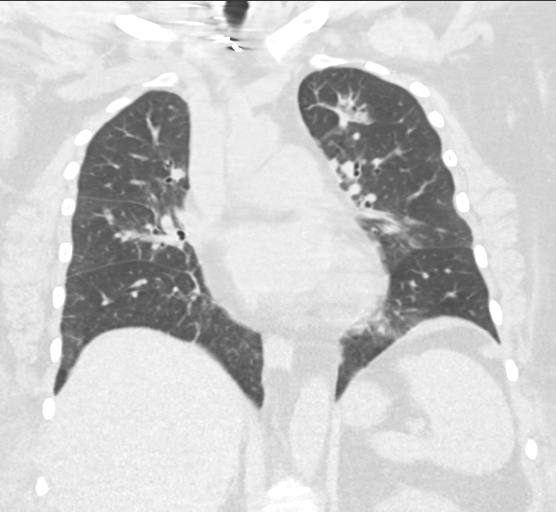

[13 of 36 positions shown; findings below may reference images not displayed]

FINDINGS: Cardiovascular: Heart is mildly enlarged. Mild moderate coronary
artery calcifications. Mild enlargement of the main pulmonary
artery, which measures 3.9 cm. Aorta is normal in caliber.

Mediastinum/Nodes: Changes from right thyroid surgery. No neck base
or axillary masses or adenopathy. No mediastinal or hilar masses. 11
mm right subcarinal lymph node. No other adenopathy.

Lungs/Pleura: There are 2 areas of left upper lobe consolidation.
Small foci of consolidation noted in the right upper lobe. There is
dependent opacity in both lower lobes with milder dependent opacity
noted in the right upper lobe. No pleural effusion. No pneumothorax.

Upper Abdomen: No acute abnormality.

Musculoskeletal: No chest wall mass or suspicious bone lesions
identified.
IMPRESSION: 1. Multifocal pneumonia. Consolidation is most evident in the left
upper lobe.
2. Dependent lung opacity most evident lower lobes is likely
combination of infection and atelectasis.

## 2017-08-14 ENCOUNTER — Encounter: Payer: Self-pay | Admitting: Endocrinology

## 2017-08-14 ENCOUNTER — Ambulatory Visit (INDEPENDENT_AMBULATORY_CARE_PROVIDER_SITE_OTHER): Payer: Medicare Other | Admitting: Endocrinology

## 2017-08-14 VITALS — BP 184/126 | HR 113 | Wt 338.8 lb

## 2017-08-14 DIAGNOSIS — Z794 Long term (current) use of insulin: Secondary | ICD-10-CM

## 2017-08-14 DIAGNOSIS — E119 Type 2 diabetes mellitus without complications: Secondary | ICD-10-CM | POA: Diagnosis not present

## 2017-08-14 LAB — POCT GLYCOSYLATED HEMOGLOBIN (HGB A1C): HEMOGLOBIN A1C: 6.8 % — AB (ref 4.0–5.6)

## 2017-08-14 NOTE — Patient Instructions (Addendum)
Your blood pressure is high today.  Please see your primary care provider soon, to have it rechecked.    Please continue the same insulin for now.  check your blood sugar 3 times a day.  vary the time of day when you check, between before the 3 meals, and at bedtime.  also check if you have symptoms of your blood sugar being too high or too low.  please keep a record of the readings and bring it to your next appointment here (or you can bring the meter itself).  You can write it on any piece of paper.  please call us sooner if your blood sugar goes below 70, or if you have a lot of readings over 200.  Please come back for a follow-up appointment in 3-4 months.   When you get another steroid shot, I expect that your blood sugar will go up again.  If so, take an extra 2 units of humalog for any blood sugar in the 200's, and 4 extra if it is over 300.

## 2017-08-14 NOTE — Progress Notes (Signed)
Subjective:    Patient ID: [name], female    DOB: [birth year], 62 y.o.   MRN: [id number]  HPI Pt returns for f/u of postsurgical hypothyroidism (she had thyroidectomy in approx 1988, for a goiter; she says no cancer was found; she has been on prescribed thyroid hormone therapy since then; she has not recently had thyroid imaging; she does not know why TSH fluctuates, but med noncompliance has been noted; she takes synthroid as rx'ed; MRI (2018) showed normal orbits are normal, and no mention was made of the pituitary).  She takes synthroid as rx'ed Pt also returns for f/u of diabetes mellitus: DM type: Insulin-requiring type 2.   Dx'ed: 7510 Complications: renal insuff and TIA Therapy: insulin since 2015, and janumet GDM: never DKA: never Severe hypoglycemia: never.   Pancreatitis: never Pancreatic imaging: Fatty atrophy was noted on 2018 CT.  Other: she takes multiple daily injections Interval history: Meter is downloaded today, and the printout is scanned into the record.  It varies from 80-199. There is no trend throughout the day. Pt takes meds as rx'ed. No recent steroid shots, but she will get another next month.  Pt sats elev BP is due to severe back pain.   Past Medical History:  Diagnosis Date  . Anemia   . Anxiety   . Arthritis    "pretty much all my joints"  . Asthma   . Benign paroxysmal positional vertigo 12/30/2012  . Breast cancer ([place]) 02/13/12   ruq  100'clock bx Ductal Carcinoma in Situ,(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  . Breast mass in female    L breast 2008, US showed likely fat necrosis  . Chronic lower back pain   . CKD (chronic kidney disease), stage III ([place])    "lower stage" (01/06/2014)  . Daily headache    last time was 09/22/16 (brother passed away)  . Depression   . Dyspnea   . Gait abnormality 05/29/2016  . GERD (gastroesophageal reflux disease)   . History of blood transfusion 2011   "after one of my OR's"  . History of hiatal hernia   . History  of stomach ulcers   . HSV (herpes simplex virus) infection   . Hx of radiation therapy 05/05/12- 07/15/12   right breast, 45 gray x 25 fx, lumpectomy cavity boosted to 16.2 gray  . Hyperlipemia   . Hypertension    sees Dr. Criss Rosales , [facility] [place]  . Hypothyroidism   . Kidney stones   . Knee pain, bilateral   . Migraines    "~ 3 times/month" (01/06/2014)  . Obesity   . OSA on CPAP    pt does not know settings  . Pancreatitis    hx of  . Personal history of radiation therapy 02/2012  . Pneumonia 1950's  . Polyneuropathy in diabetes(357.2)   . Schizophrenia ([place])   . 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 Tachycardia    with sx in 2008  . Thyroid disease   . Type II diabetes mellitus ([place])    dx 2014    Past Surgical History:  Procedure Laterality Date  . ABDOMINAL HYSTERECTOMY  1979?   partial  . benign cyst  Left    removed from left breast  . BREAST BIOPSY Right 02/13/2012  . BREAST LUMPECTOMY  03/03/2012   Procedure: LUMPECTOMY;  Surgeon: Haywood Lasso, MD;  Location: [facility];  Service: General;  Laterality: Right;  . BREAST SURGERY Right 01/2012   "cancer"  . CHOLECYSTECTOMY  1980's?  [name]  COLONOSCOPY WITH PROPOFOL N/A 09/28/2012   Procedure: COLONOSCOPY WITH PROPOFOL;  Surgeon: Lear Ng, MD;  Location: [facility];  Service: Endoscopy;  Laterality: N/A;  . FOOT FRACTURE SURGERY Right 1990's  . JOINT REPLACEMENT      x 3  . LUMBAR LAMINECTOMY/DECOMPRESSION MICRODISCECTOMY N/A 12/05/2016   Procedure: LEFT L5-S1 MICRODISCECTOMY;  Surgeon: Marybelle Killings, MD;  Location: [facility];  Service: Orthopedics;  Laterality: N/A;  . REVISION TOTAL KNEE ARTHROPLASTY  07/2009  . SHOULDER OPEN ROTATOR CUFF REPAIR Left 1990's  . THYROIDECTOMY  1970's  . TOTAL KNEE ARTHROPLASTY Bilateral 2009-06/2009   left; right    Social History   Socioeconomic History  . Marital status: Divorced    Spouse name: Not on file  . Number of children: 2  . Years of education: 67  .  Highest education level: Not on file  Occupational History  . Occupation: Disabled  Social Needs  . Financial resource strain: Not on file  . Food insecurity:    Worry: Not on file    Inability: Not on file  . Transportation needs:    Medical: Not on file    Non-medical: Not on file  Tobacco Use  . Smoking status: Former Smoker    Packs/day: 1.00    Years: 7.00    Pack years: 7.00    Types: Cigarettes    Last attempt to quit: 03/24/1992    Years since quitting: 25.4  . Smokeless tobacco: Never Used  Substance and Sexual Activity  . 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 Drug use: No  . Sexual activity: Never    Birth control/protection: Surgical  Lifestyle  . Physical activity:    Days per week: Not on file    Minutes per session: Not on file  . Stress: Not on file  Relationships  . Social connections:    Talks on phone: Not on file    Gets together: Not on file    Attends religious service: Not on file    Active member of club or organization: Not on file    Attends meetings of clubs or organizations: Not on file    Relationship status: Not on file  . Intimate partner violence:    Fear of current or ex partner: Not on file    Emotionally abused: Not on file    Physically abused: Not on file    Forced sexual activity: Not on file  Other Topics Concern  . Not on file  Social History Narrative   Single. Disabled Biochemist, clinical.          Patient is right handed.    Denies caffeine use     Current Outpatient Medications on File Prior to Visit  Medication Sig Dispense Refill  . albuterol (PROAIR HFA) 108 (90 Base) MCG/ACT inhaler Inhale 2 puffs into the lungs every 4 (four) hours as needed for wheezing or shortness of breath.     [name] aspirin EC 81 MG tablet Take 1 tablet (81 mg total) by mouth daily. 30 tablet 0  . B Complex-C (B-COMPLEX WITH VITAMIN C) tablet Take 1 tablet by mouth daily.    . budesonide-formoterol  (SYMBICORT) 80-4.5 MCG/ACT inhaler Inhale 2 puffs into the lungs 2 (two) times daily. (Patient taking differently: Inhale 1 puff into the lungs 3 (three) times daily. ) 1 Inhaler 11  . calcitRIOL (ROCALTROL) 0.25 MCG capsule TAKE ONE CAPSULE BY MOUTH three times  WEEKLY 13 capsule 5  . Cholecalciferol (VITAMIN D-3) 1000 units CAPS Take 1,000 Units by mouth daily.    . Cyanocobalamin (VITAMIN B-12 PO) Take 1 tablet by mouth daily.    . diphenhydrAMINE (BENADRYL) 25 MG tablet Take 50 mg by mouth every 6 (six) hours as needed for itching or allergies.    [name] doxepin (SINEQUAN) 25 MG capsule Take 50 mg by mouth at bedtime.    [name] esomeprazole (NEXIUM) 40 MG capsule Take 1 capsule (40 mg total) by mouth daily before breakfast. (Patient taking differently: Take 40 mg by mouth 2 (two) times daily before a meal. ) 30 capsule 1  . furosemide (LASIX) 40 MG tablet Take 1 tablet (40 mg total) by mouth daily. 30 tablet 0  . glucose blood (ONETOUCH VERIO) test strip Check blood sugar 3 times daily 100 each 12  . HUMALOG KWIKPEN 100 UNIT/ML KiwkPen INJECT 10 UNITS SUBCUTANEOUSLY THREE TIMES DAILY 15 mL 4  . Insulin Detemir (LEVEMIR FLEXTOUCH) 100 UNIT/ML Pen Inject 10 Units into the skin at bedtime. 15 mL 4  . Iron-Vitamins (GERITOL PO) Take 1 tablet by mouth daily.    [name] JANUMET 50-500 MG tablet Take 1 tablet by mouth 2 (two) times daily with a meal. 60 tablet 1  . levothyroxine (SYNTHROID, LEVOTHROID) 150 MCG tablet Take 1 tablet (150 mcg total) by mouth daily before breakfast. 30 tablet 2  . linaclotide (LINZESS) 290 MCG CAPS capsule Take 290 mcg by mouth daily before breakfast.    . LORazepam (ATIVAN) 2 MG tablet Take 1-2 mg by mouth See admin instructions. Take 1 mg by mouth in the morning and take 2 mg by mouth at bedtime    . losartan-hydrochlorothiazide (HYZAAR) 100-12.5 MG tablet Take 1 tablet by mouth daily.    [name] LYRICA 150 MG capsule Take 150 mg by mouth 3 (three) times daily.   1  . methocarbamol (ROBAXIN)  500 MG tablet Take 1 tablet (500 mg total) by mouth every 8 (eight) hours as needed for muscle spasms. 30 tablet 0  . Multiple Vitamin (MULTIVITAMIN WITH MINERALS) TABS tablet Take 1 tablet by mouth daily.    . Oxycodone HCl 10 MG TABS TAKE ONE TABLET BY MOUTH SIX times daily  0  . POTASSIUM PO Take 1 tablet by mouth daily.    . rosuvastatin (CRESTOR) 20 MG tablet Take 20 mg by mouth daily.    [name] tiZANidine (ZANAFLEX) 4 MG tablet Take 4 mg by mouth 2 (two) times daily.     . valACYclovir (VALTREX) 1000 MG tablet Take 1,000 mg by mouth daily.    [name] venlafaxine XR (EFFEXOR-XR) 150 MG 24 hr capsule Take 150 mg by mouth 2 (two) times daily.    . ziprasidone (GEODON) 20 MG capsule Take 1 capsule (20 mg total) by mouth 2 (two) times daily with a meal. (Patient taking differently: Take 40 mg by mouth daily. )     No current facility-administered medications on file prior to visit.     Allergies  Allergen Reactions  . Bee Venom Anaphylaxis  . Shellfish Allergy Anaphylaxis  . Buprenorphine Hcl Other (See Comments)    Overly sedated  . Other Other (See Comments)    Cats - bad asthma, stopped up    Family History  Problem Relation Age of Onset  . Heart disease Brother        Multiple MIs, starting in his 67s  . Diabetes Brother   . Thyroid disease Brother   .  Hypertension Mother   . Diabetes Mother   . Breast cancer Mother 87  . Bone cancer Mother   . Hypertension Sister   . Diabetes Sister   . Breast cancer Sister 89  . Thyroid disease Sister   . Breast cancer Maternal Grandmother   . Heart disease Maternal Grandmother   . Uterine cancer Other 19  . Breast cancer Paternal Aunt 17  . Breast cancer Paternal Grandmother        dx in her 54s  . Prostate cancer Paternal Grandfather   . Asthma Son     BP (!) 184/126 (BP Location: Left Wrist, Patient Position: Sitting, Cuff Size: Normal)   Pulse (!) 113   Wt (!) 338 lb 12.8 oz (153.7 kg)   SpO2 97%   BMI 66.17 kg/m    Review of  Systems She denies hypoglycemia    Objective:   Physical Exam VITAL SIGNS:  See vs page GENERAL: no distress Pulses: foot pulses are intact bilaterally.   MSK: no deformity of the feet or ankles.  CV: trace bilat edema of the legs.  Skin:  no ulcer on the feet or ankles.  normal color and temp on the feet and ankles Neuro: sensation is intact to touch on the feet and ankles.     Lab Results  Component Value Date   HGBA1C 6.8 (A) 08/14/2017   Lab Results  Component Value Date   TSH 4.04 06/15/2017       Assessment & Plan:  Insulin-requiring type 2 DM, with renal insuff: well-controlled HTN: is noted today Back pain: we'll need to adjust insulin dosage for future steroid shots  Patient Instructions  Your blood pressure is high today.  Please see your primary care provider soon, to have it rechecked.    Please continue the same insulin for now.  check your blood sugar 3 times a day.  vary the time of day when you check, between before the 3 meals, and at bedtime.  also check if you have symptoms of your blood sugar being too high or too low.  please keep a record of the readings and bring it to your next appointment here (or you can bring the meter itself).  You can write it on any piece of paper.  please call us sooner if your blood sugar goes below 70, or if you have a lot of readings over 200.  Please come back for a follow-up appointment in 3-4 months.   When you get another steroid shot, I expect that your blood sugar will go up again.  If so, take an extra 2 units of humalog for any blood sugar in the 200's, and 4 extra if it is over 300.

## 2017-08-17 ENCOUNTER — Ambulatory Visit: Payer: Medicare Other | Admitting: Physical Therapy

## 2017-08-24 ENCOUNTER — Encounter: Payer: Self-pay | Admitting: Physical Therapy

## 2017-08-26 IMAGING — MR MR HEAD W/O CM
10 of 11 series · 36 of 48 positions shown · non-contrast
Comparison: 02/19/2016 CT head.

CLINICAL DATA: 60 y/o  F; slurred speech.

EXAM:
MRI HEAD WITHOUT CONTRAST
TECHNIQUE: Multiplanar, multiecho pulse sequences of the brain and surrounding
structures were obtained without intravenous contrast.

[Series 2: FLAIR · sagittal · 5.0mm · 0.47mm/px · 3 of 23 slices shown (1 of 2)]
[im 1/23]
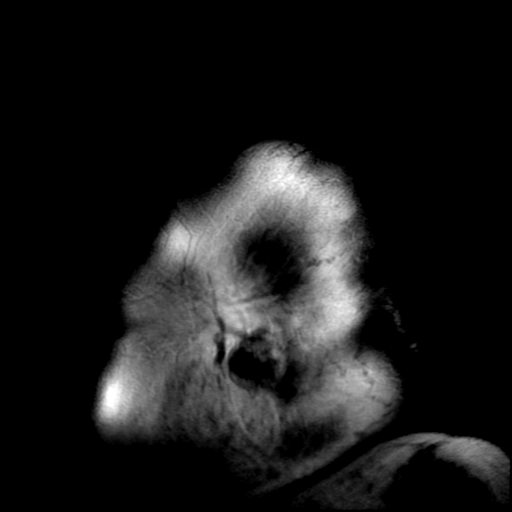
[im 12/23]
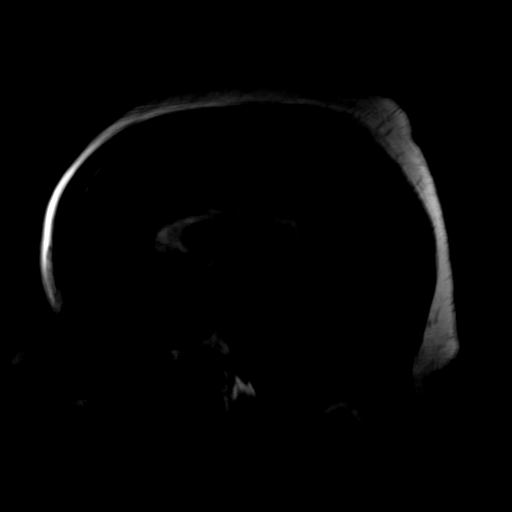
[im 23/23]
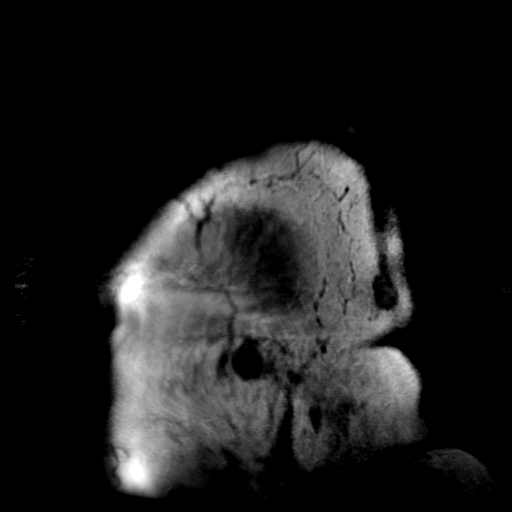

[Series 4: DWI · axial · 3.0mm · 0.94mm/px · z∈[-104,+40]mm · 9 of 100 slices shown (1 of 2)]
[im 1/100]
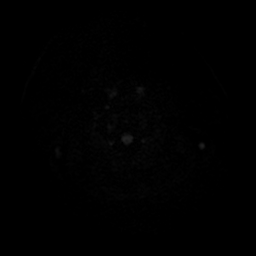
[im 13/100]
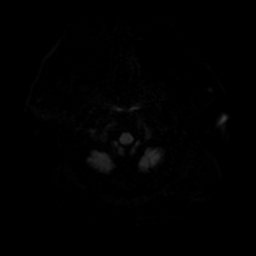
[im 25/100]
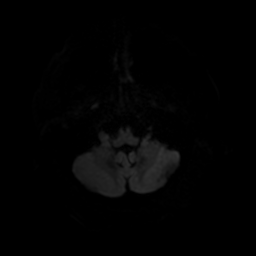
[im 38/100]
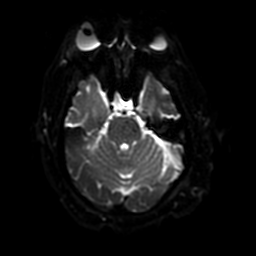
[im 50/100]
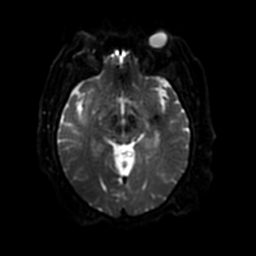
[im 62/100]
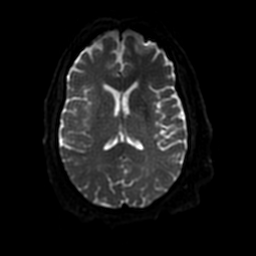
[im 75/100]
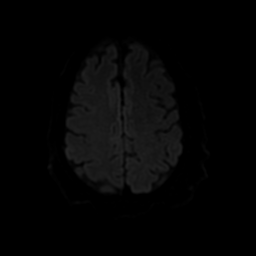
[im 87/100]
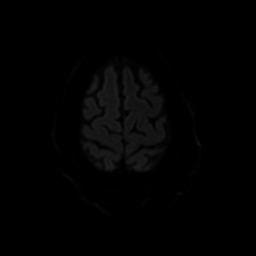
[im 100/100]
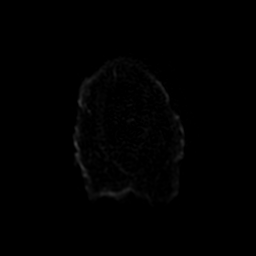

[Series 5: T2 · axial · 5.0mm · 0.47mm/px · z∈[-103,+38]mm · 2 of 25 slices shown (1 of 2)]
[im 1/25]
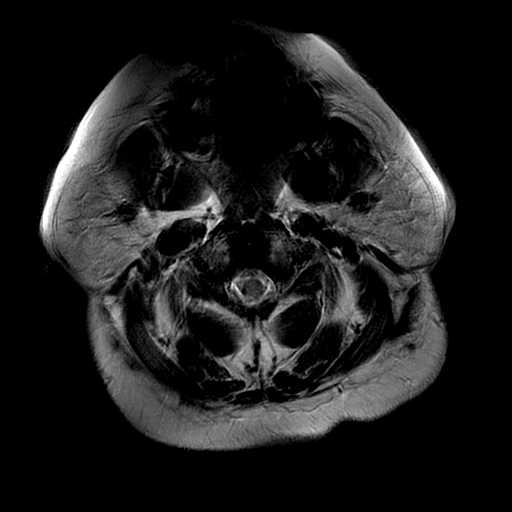
[im 25/25]
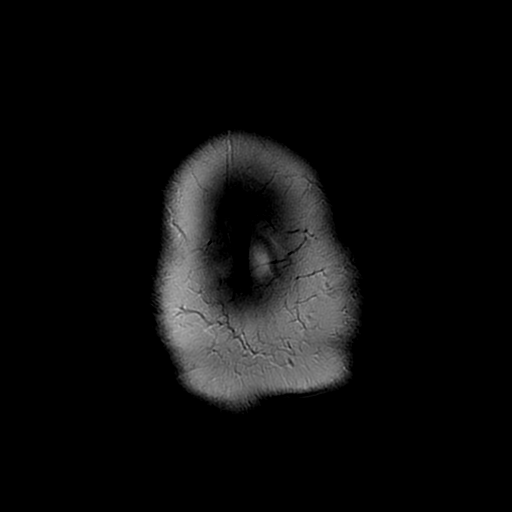

[Series 6: FLAIR · axial · 5.0mm · 0.47mm/px · z∈[-103,+38]mm · 2 of 25 slices shown (2 of 2)]
[im 1/25]
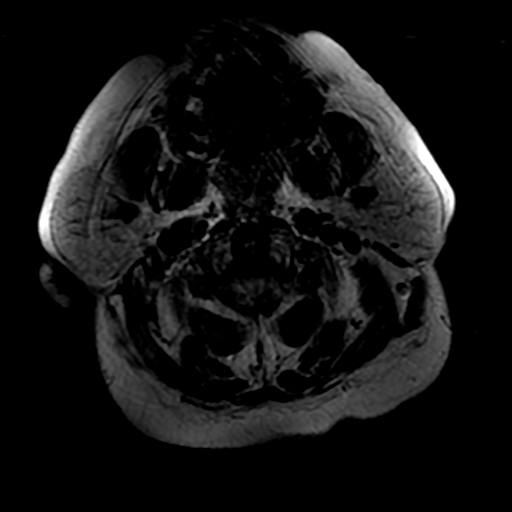
[im 25/25]
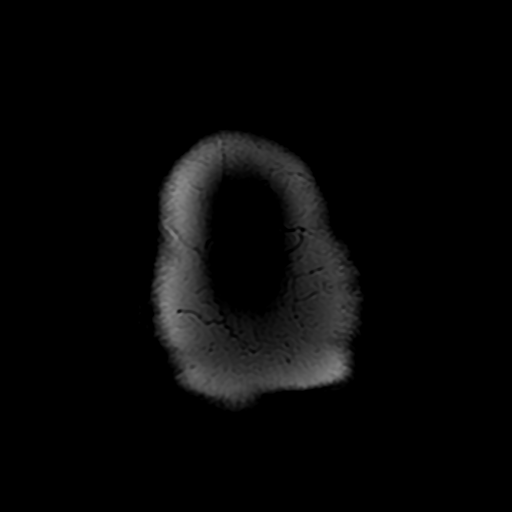

[Series 7: DWI · coronal · 4.0mm · 0.94mm/px · 7 of 72 slices shown (2 of 2)]
[im 1/72]
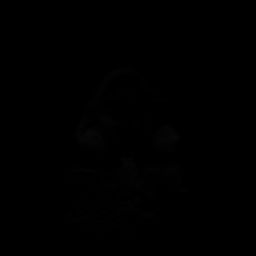
[im 12/72]
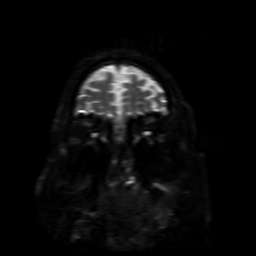
[im 24/72]
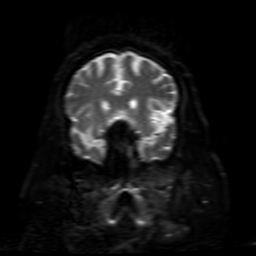
[im 36/72]
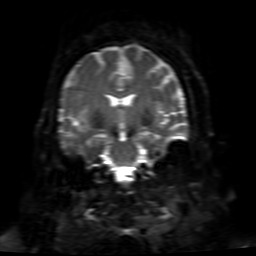
[im 48/72]
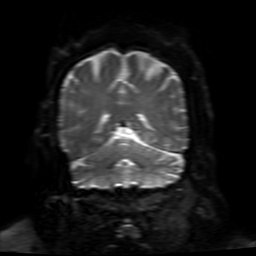
[im 60/72]
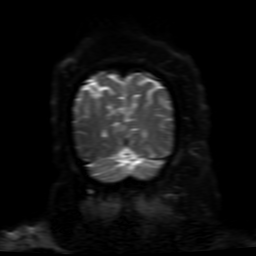
[im 72/72]
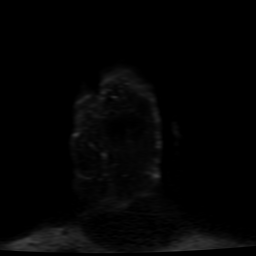

[Series 8: (person_name) · axial · 3.0mm · 0.47mm/px · 1 of 100 slices shown]
[im 1/100]
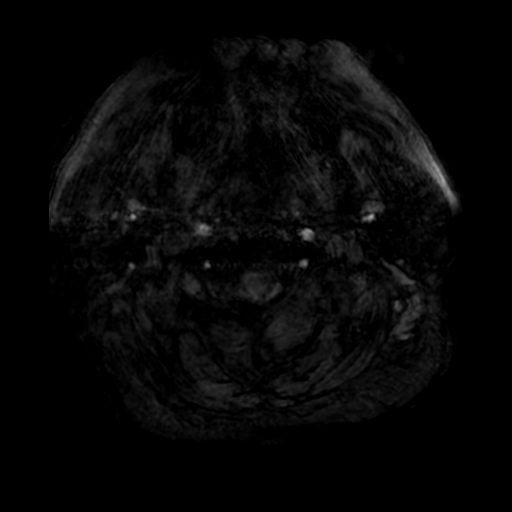

[Series 10: T2 · coronal · 5.0mm · 0.47mm/px · 2 of 27 slices shown (2 of 2)]
[im 1/27]
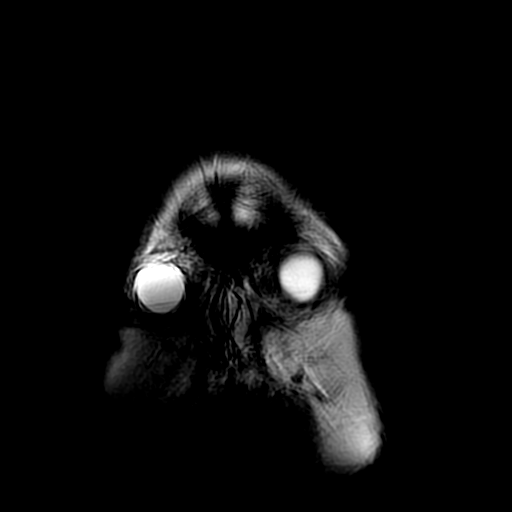
[im 27/27]
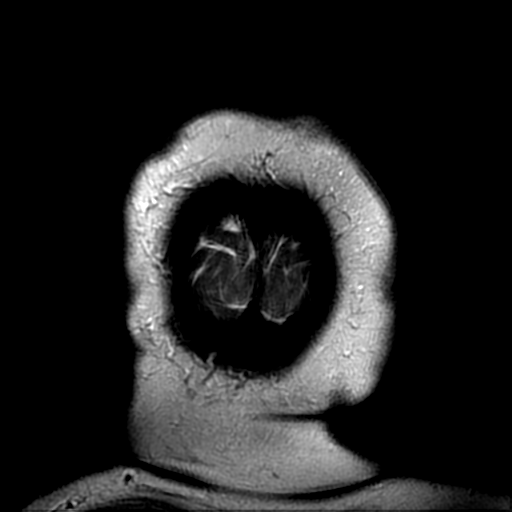

[Series 11: T1 · axial · 5.0mm · 0.47mm/px · z∈[-103,+38]mm · 2 of 25 slices shown]
[im 1/25]
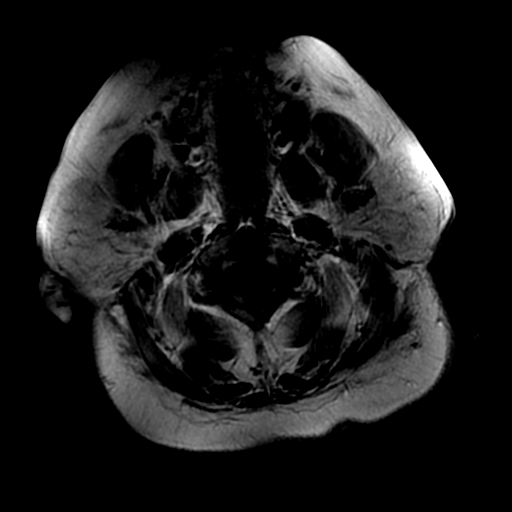
[im 25/25]
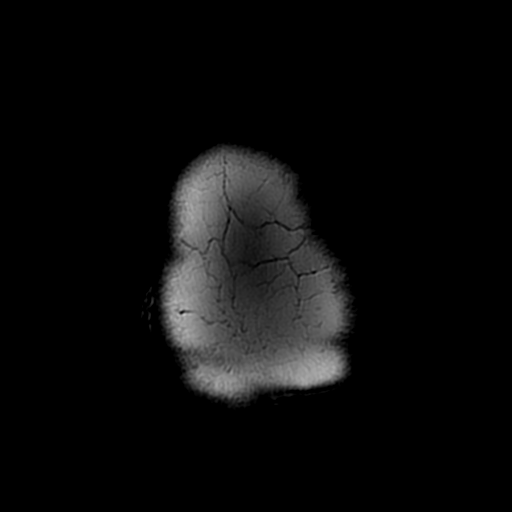

[Series 450: ADC · axial · 3.0mm · 0.94mm/px · z∈[-104,+40]mm · 5 of 50 slices shown (1 of 2)]
[im 1/50]
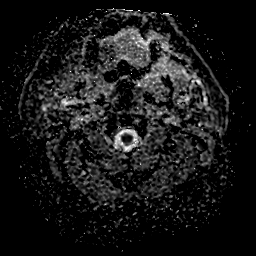
[im 13/50]
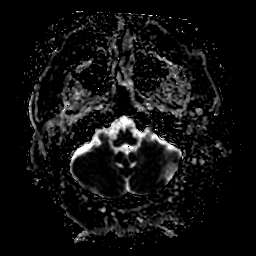
[im 25/50]
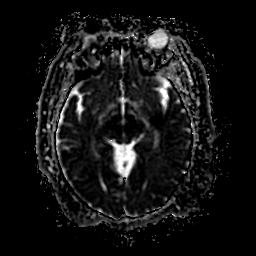
[im 37/50]
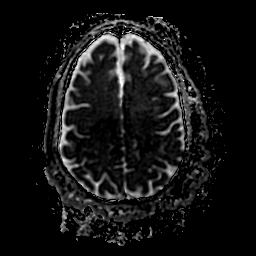
[im 50/50]
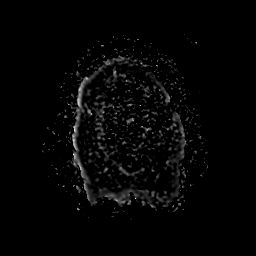

[Series 750: ADC · coronal · 4.0mm · 0.94mm/px · 3 of 36 slices shown (2 of 2)]
[im 1/36]
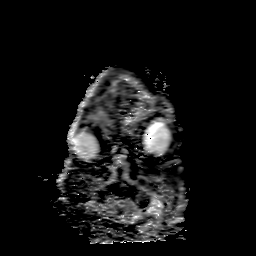
[im 18/36]
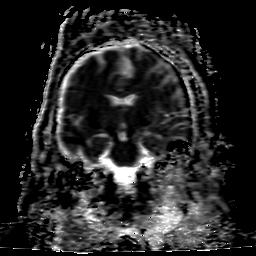
[im 36/36]
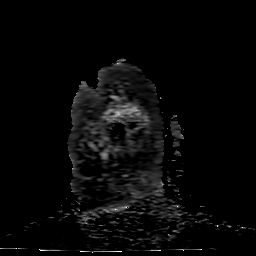

[36 of 48 positions shown; findings below may reference images not displayed]

FINDINGS: Brain: Motion degraded T1 sequence. No diffusion signal abnormality.
Motion degraded axial T2 weighted sequence. No significant T2 FLAIR
signal abnormality of brain parenchyma. No focal mass effect,
extra-axial collection, or hydrocephalus. Motion degraded ARTUR
sequence, no gross susceptibility artifact to suggest intracranial
hemorrhage.

Vascular: Normal flow voids.

Skull and upper cervical spine: Normal marrow signal.

Sinuses/Orbits: Negative.

Other: None.
IMPRESSION: Motion degraded study. No evidence of acute infarct, hemorrhage, or
mass effect.

By: Gibs Cabdi M.D.

## 2017-08-26 IMAGING — CT CT HEAD CODE STROKE
3 of 5 series · 17 of 47 positions shown, 20 images · non-contrast
Comparison: 11/12/2014

ADDENDUM:
Case discussed with Dr Nala via phone at [DATE] p.m.
CLINICAL DATA: Code stroke.  Slurred speech.

EXAM:
CT HEAD WITHOUT CONTRAST
TECHNIQUE: Contiguous axial images were obtained from the base of the skull
through the vertex without intravenous contrast.

[Series 201: head w/o, idose (1) · axial · non-contrast · 0.44mm/px · z∈[+56,+176]mm · 11 of 30 slices shown, 14 images]
[im 3/30  brain]
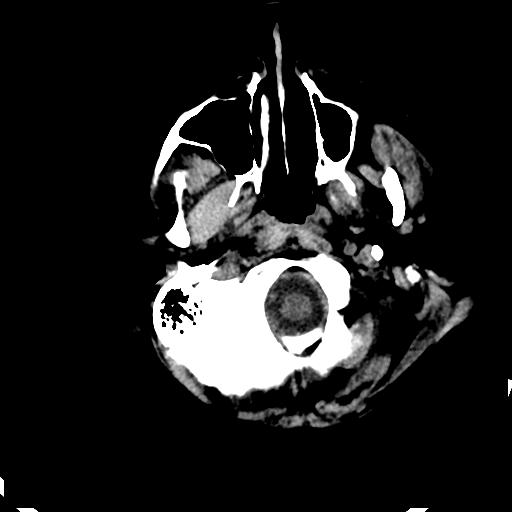
[im 3/30  bone]
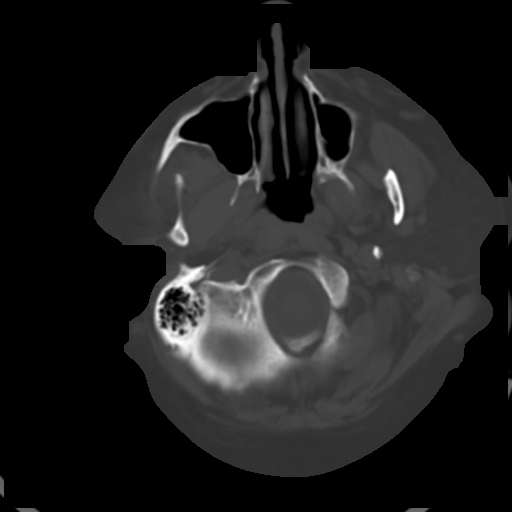
[im 5/30  brain]
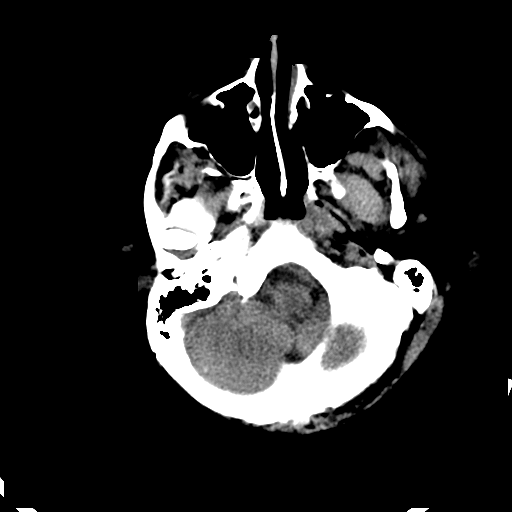
[im 7/30  brain]
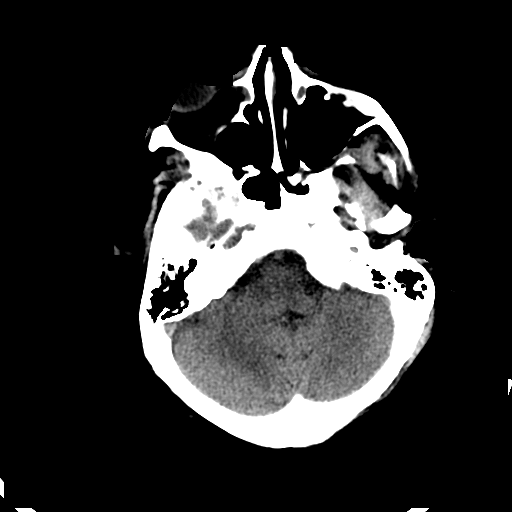
[im 11/30  brain]
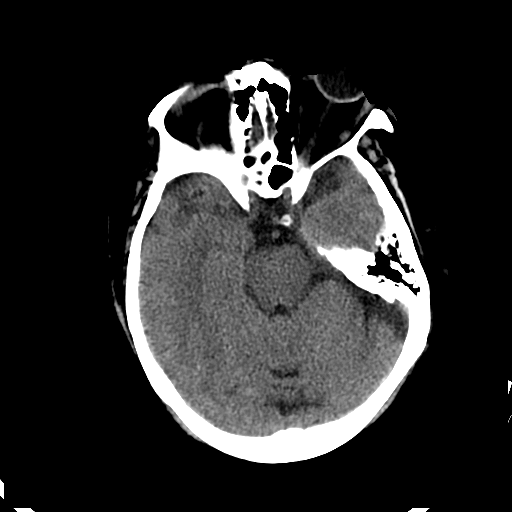
[im 13/30  brain]
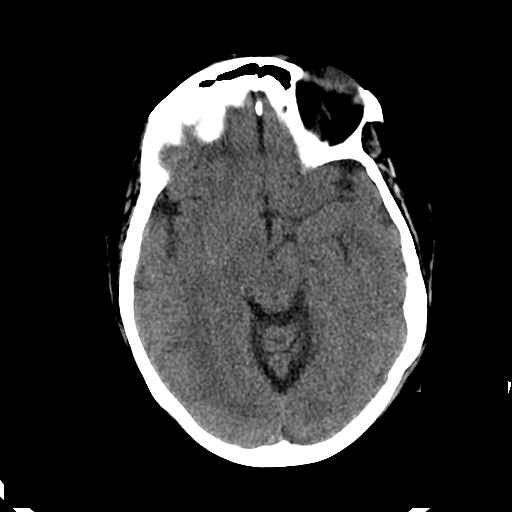
[im 13/30  bone]
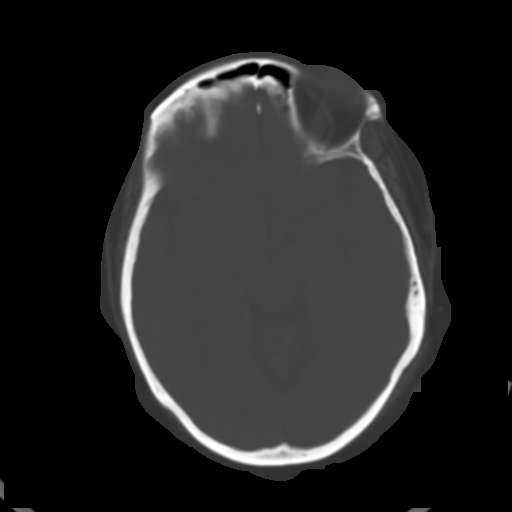
[im 15/30  brain]
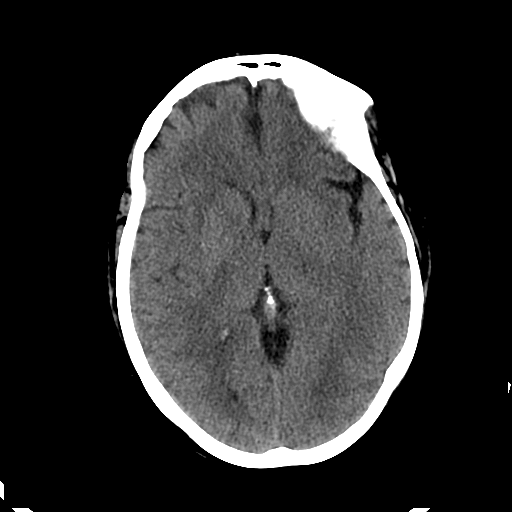
[im 17/30  brain]
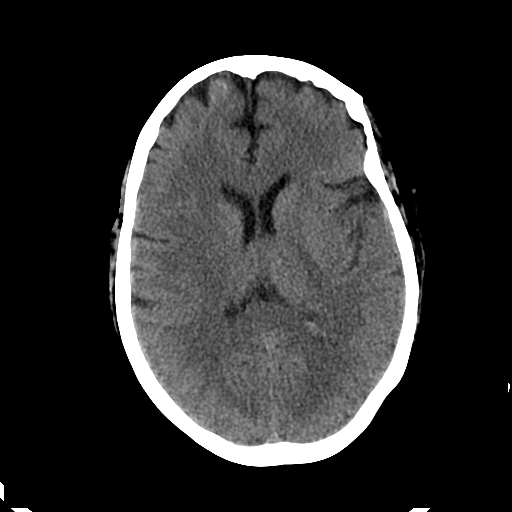
[im 19/30  brain]
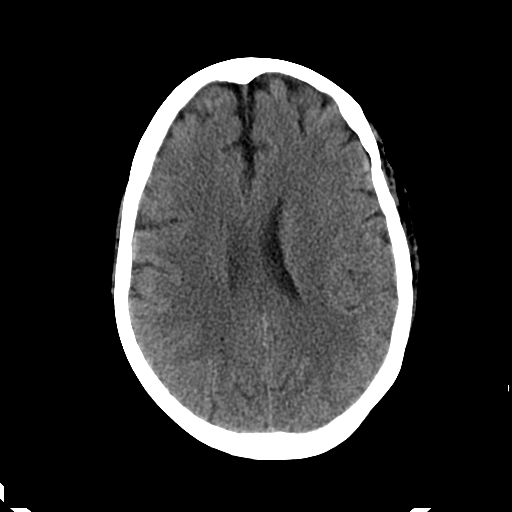
[im 23/30  brain]
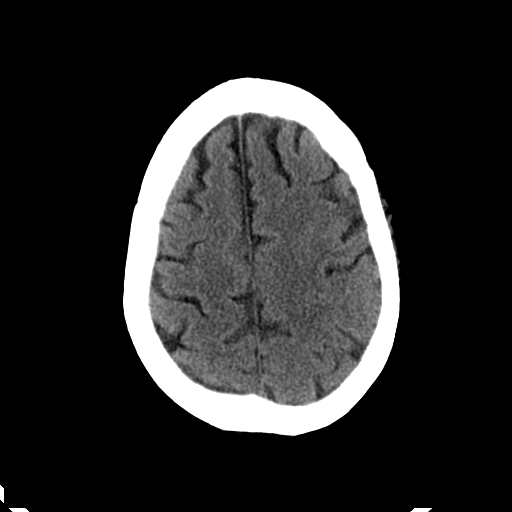
[im 23/30  bone]
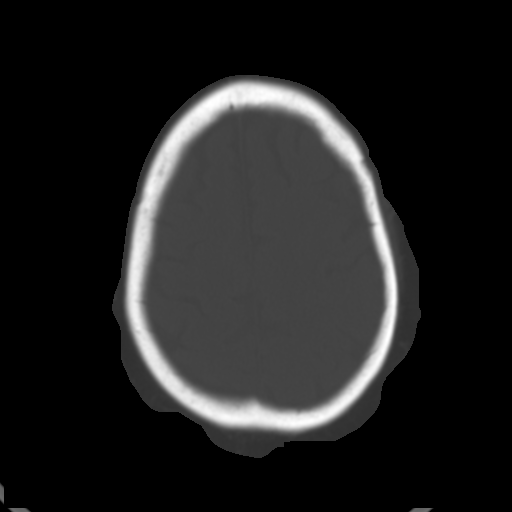
[im 25/30  brain]
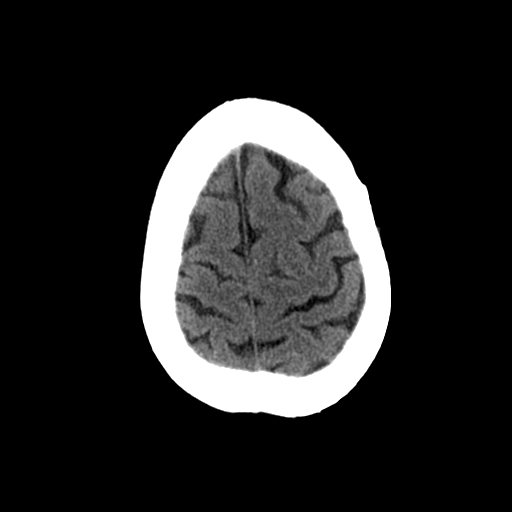
[im 27/30  brain]
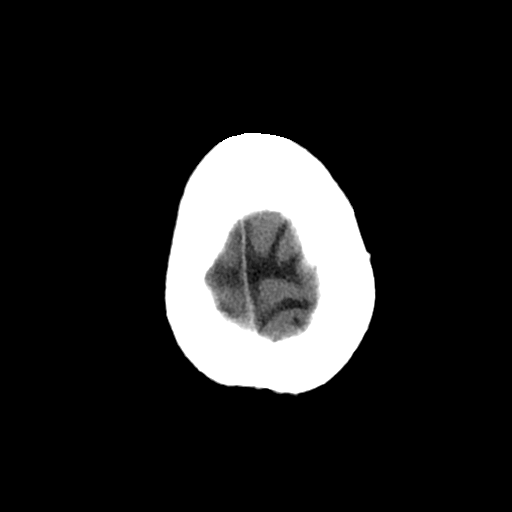

[Series 203: coronal st, idose (1) · coronal · 0.40mm/px · 3 of 72 slices shown]
[im 18/72  brain]
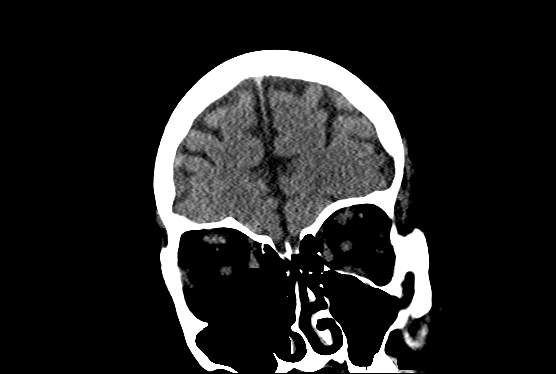
[im 36/72  brain]
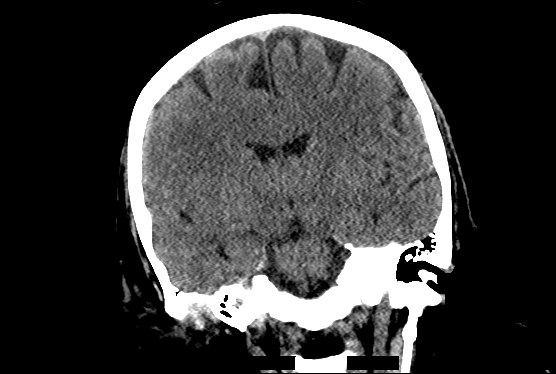
[im 54/72  brain]
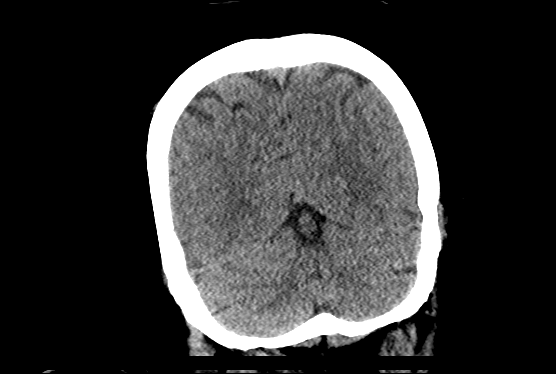

[Series 204: sagittal st, idose (1) · sagittal · 0.40mm/px · 3 of 70 slices shown]
[im 24/70  brain]
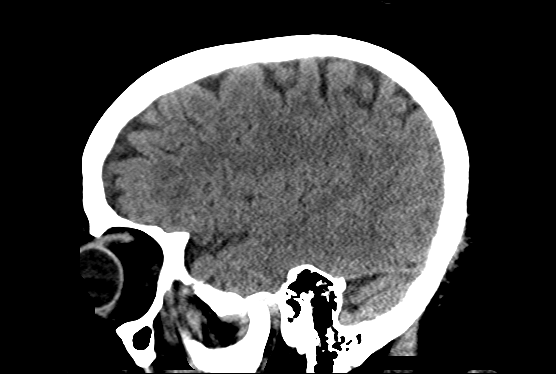
[im 35/70  brain]
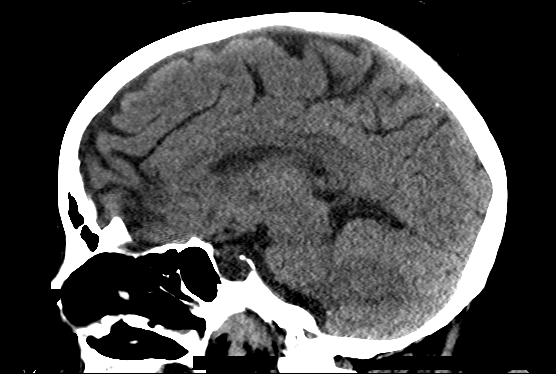
[im 47/70  brain]
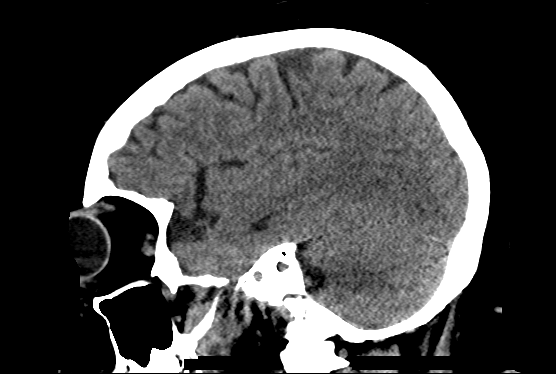

[17 of 47 positions shown; findings below may reference images not displayed]

FINDINGS: Brain: No evidence of acute infarction, hemorrhage, hydrocephalus,
extra-axial collection or mass lesion/mass effect.

Vascular: No hyperdense vessel or unexpected calcification.

Skull: 14 mm low left scalp lesion with ill-defined margins
measuring up to 14 mm. There may be skin retraction, although there
also diffuse skin folds.

Sinuses/Orbits: Chronic proptosis, known. Patient has history of
thyroid disease in this could be related orbitopathy.

Other: Page sent out at the time of interpretation on 02/29/2016 at
[DATE] to Dr. Nala. Text page with results sent out at [DATE]
a.m.. Awaiting callback verification.

ASPECTS ([place] Stroke Program Early CT Score)

- Ganglionic level infarction (caudate, lentiform nuclei, internal
capsule, insula, M1-M3 cortex): 7

- Supraganglionic infarction (M4-M6 cortex): 3

Total score (0-10 with 10 being normal): 10
IMPRESSION: 1. No acute finding. ASPECTS is 10.
2. 14 mm low left scalp lesion which is new from 7369 and has an
infiltrative appearance. This could be postinflammatory or
neoplastic and skin exam is recommended.

## 2017-08-27 IMAGING — MR MR LUMBAR SPINE WO/W CM
5 of 7 series · 33 of 48 positions shown · IV contrast (multihance)
Comparison: Multiple exams, including 08/14/2014

CLINICAL DATA: Right leg weakness.  Low back pain.

EXAM:
MRI LUMBAR SPINE WITHOUT AND WITH CONTRAST
TECHNIQUE: Multiplanar and multiecho pulse sequences of the lumbar spine were
obtained without and with intravenous contrast.
CONTRAST:  20mL MULTIHANCE GADOBENATE DIMEGLUMINE 529 MG/ML IV SOLN

[Series 3: T2 · sagittal · 4.5mm · 1.17mm/px · 3 of 16 slices shown (1 of 2)]
[im 1/16]
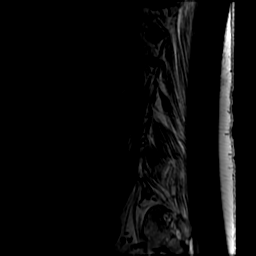
[im 8/16]
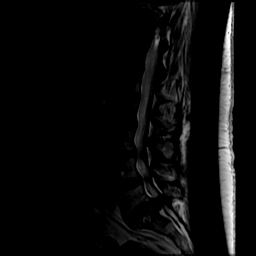
[im 16/16]
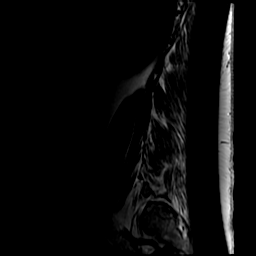

[Series 4: STIR · sagittal · 4.5mm · 1.17mm/px · 4 of 16 slices shown]
[im 1/16]
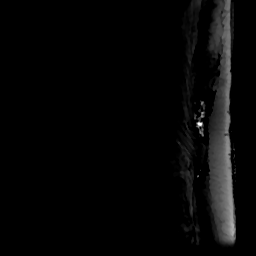
[im 6/16]
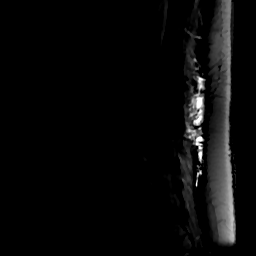
[im 11/16]
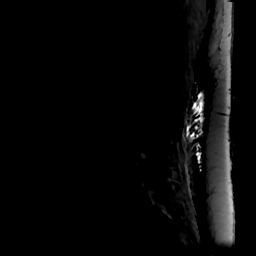
[im 16/16]
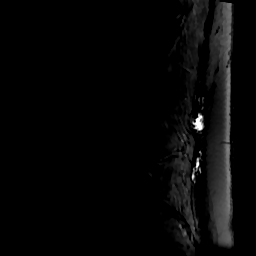

[Series 5: T1 · sagittal · 4.5mm · 1.17mm/px · 4 of 16 slices shown (1 of 2)]
[im 1/16]
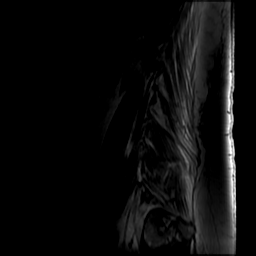
[im 6/16]
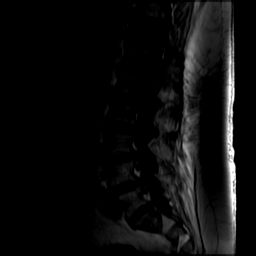
[im 11/16]
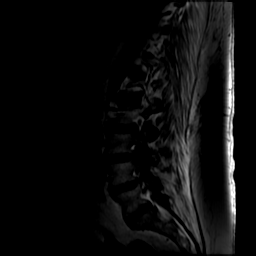
[im 16/16]
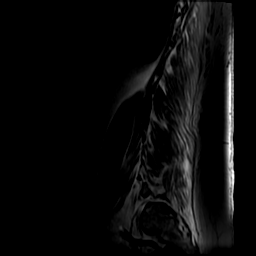

[Series 6: T2 · axial · 4.0mm · 0.43mm/px · z∈[+4,+223]mm · 11 of 41 slices shown (2 of 2)]
[im 1/41]
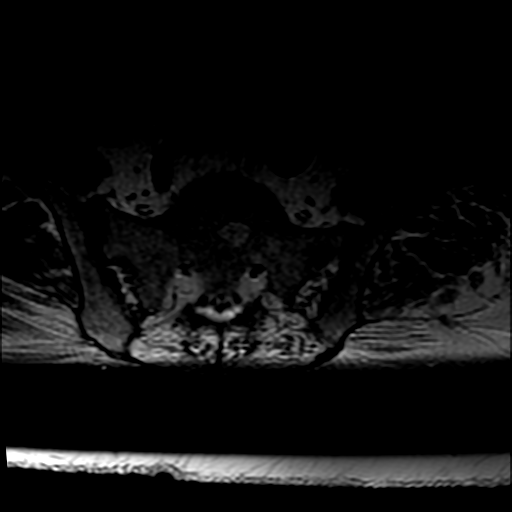
[im 5/41]
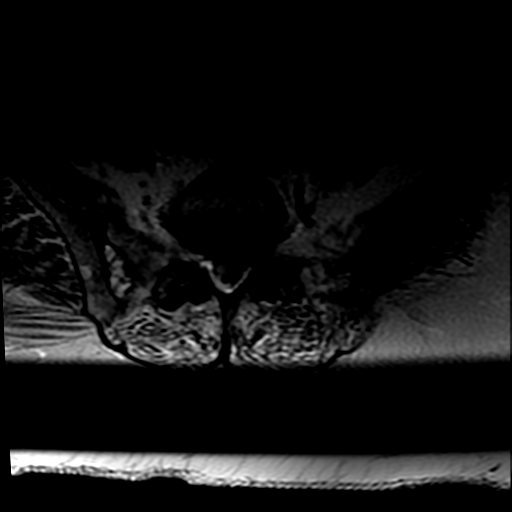
[im 9/41]
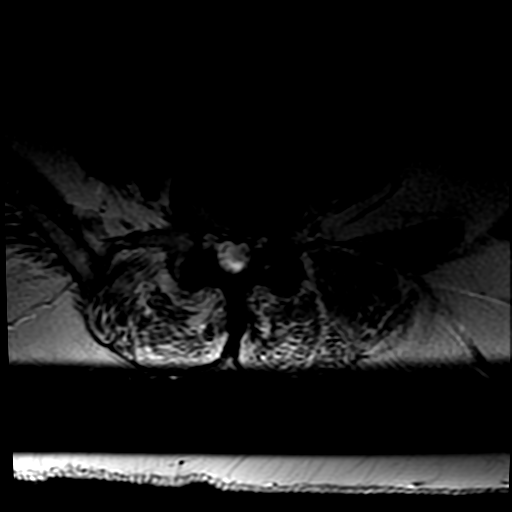
[im 13/41]
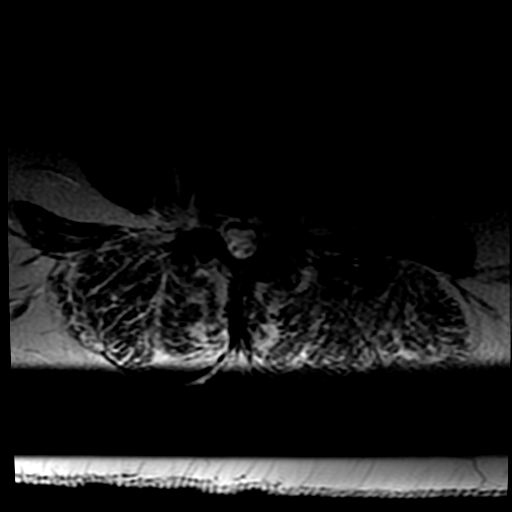
[im 17/41]
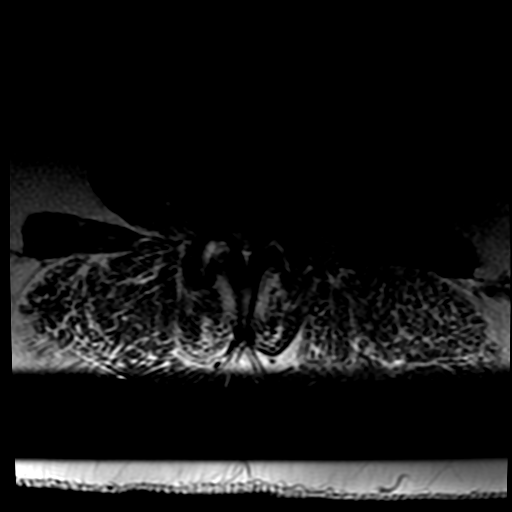
[im 21/41]
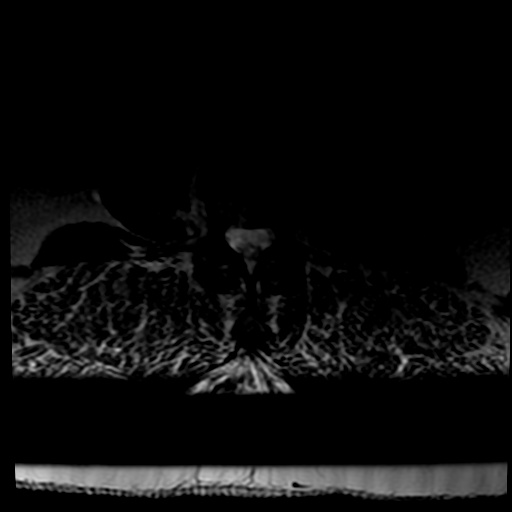
[im 25/41]
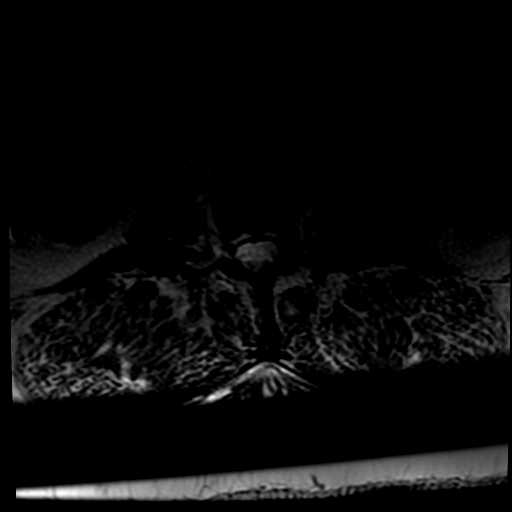
[im 29/41]
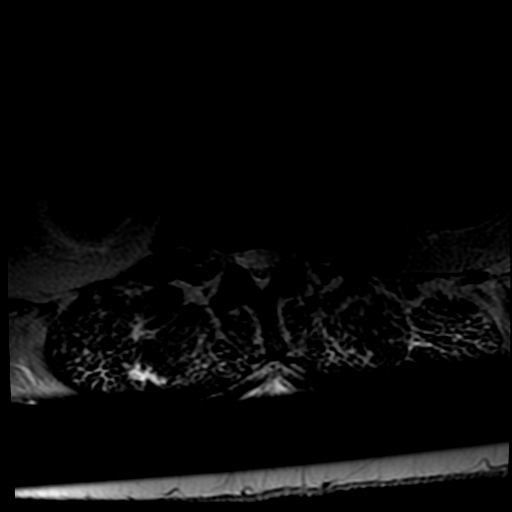
[im 33/41]
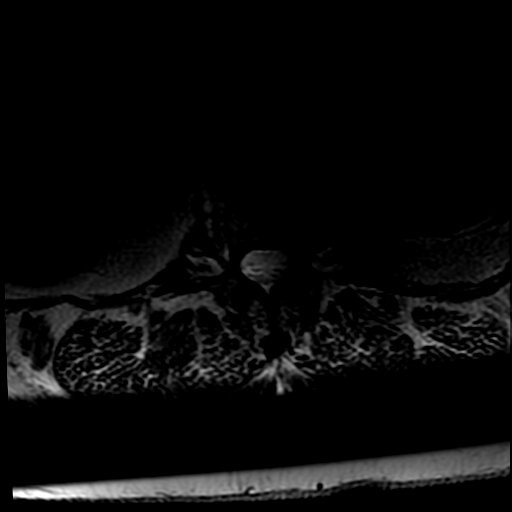
[im 37/41]
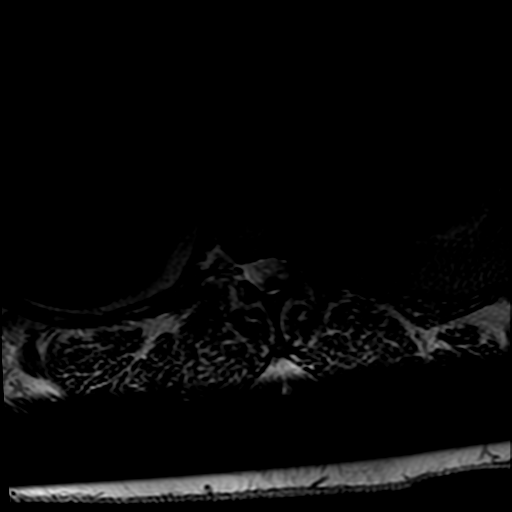
[im 41/41]
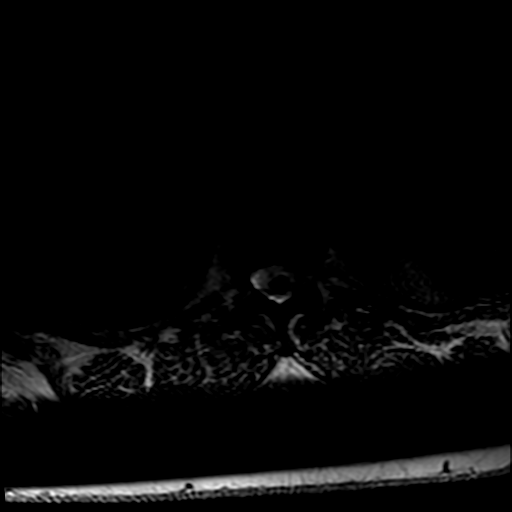

[Series 7: T1 · axial · 4.0mm · 0.86mm/px · z∈[+4,+223]mm · 11 of 41 slices shown (2 of 2)]
[im 1/41]
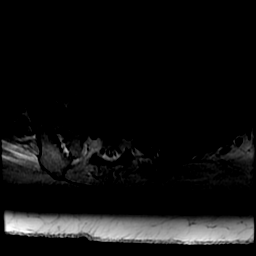
[im 5/41]
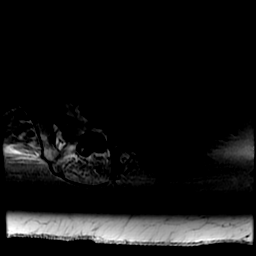
[im 9/41]
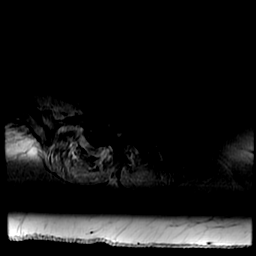
[im 13/41]
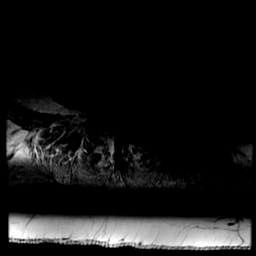
[im 17/41]
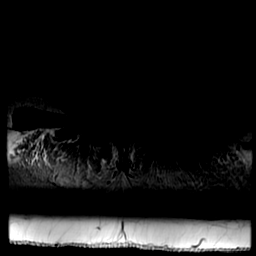
[im 21/41]
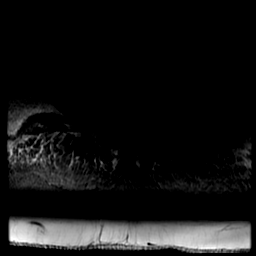
[im 25/41]
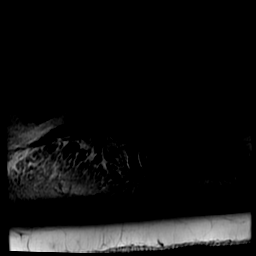
[im 29/41]
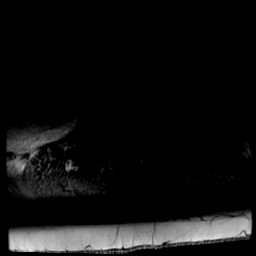
[im 33/41]
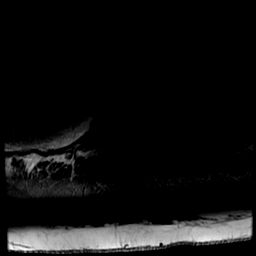
[im 37/41]
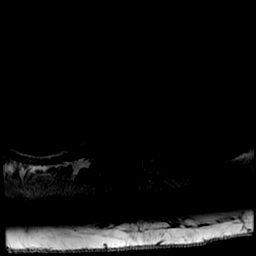
[im 41/41]
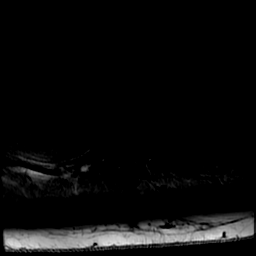

[33 of 48 positions shown; findings below may reference images not displayed]

FINDINGS: Body habitus reduces diagnostic sensitivity and specificity. Despite
efforts by the technologist and patient, motion artifact is present
on today's exam and could not be eliminated. This reduces exam
sensitivity and specificity.

Segmentation: The lowest lumbar type non-rib-bearing vertebra is
labeled as L5.

Alignment: 3 mm of grade 1 anterolisthesis at L4-5 attributed to
degenerative facet arthropathy.

Vertebrae: Mild disc desiccation at L4-5 and L5-S1. No significant
abnormal vertebral enhancement. No compelling findings of
arachnoiditis or abnormal epidural fluid collection.

Conus medullaris: Extends to the L1 level and appears normal.

Paraspinal and other soft tissues: Unremarkable

Disc levels:

L1-2:  Unremarkable.

L2-3: Mild right subarticular lateral recess stenosis and borderline
right foraminal stenosis due to right lateral recess and foraminal
disc protrusion, similar to prior.

L3-4: Moderate to prominent central narrowing of the thecal sac with
moderate bilateral subarticular lateral recess stenosis and moderate
bilateral foraminal stenosis due to right greater than left facet
arthropathy and diffuse disc bulge. The left foraminal stenosis is
mildly worsened and prior. Right greater than left facet joint
effusions.

L4-5: Moderate to prominent central narrowing of the thecal sac with
mild to moderate bilateral foraminal stenosis and moderate bilateral
subarticular lateral recess stenosis due to disc bulge and facet
arthropathy, degree of impingement mildly worsened from prior. Right
greater than left facet joint effusions.

L5-S1: Moderate left and mild right subarticular lateral recess
stenosis along with mild left and borderline right foraminal
stenosis due to left paracentral disc protrusion extending caudad,
the protrusion is shown on image [DATE]. Mildly worsened from prior.
IMPRESSION: 1. Mildly progressive lumbar spondylosis and degenerative disc
disease compared to prior, causing a moderate to prominent
impingement at L3-4 and L4-5 ; moderate impingement at L5-S1; mild
impingement at L2- 3, as detailed above.
2. Reduced sensitivity and specificity due to motion artifact and
body habitus.

## 2017-08-28 IMAGING — DX DG FOREARM 2V*R*
2 series · 2 of 2 positions shown · non-contrast
Comparison: None.

CLINICAL DATA: Fell.  Pain.

EXAM:
RIGHT FOREARM - 2 VIEW

[forearm ap]
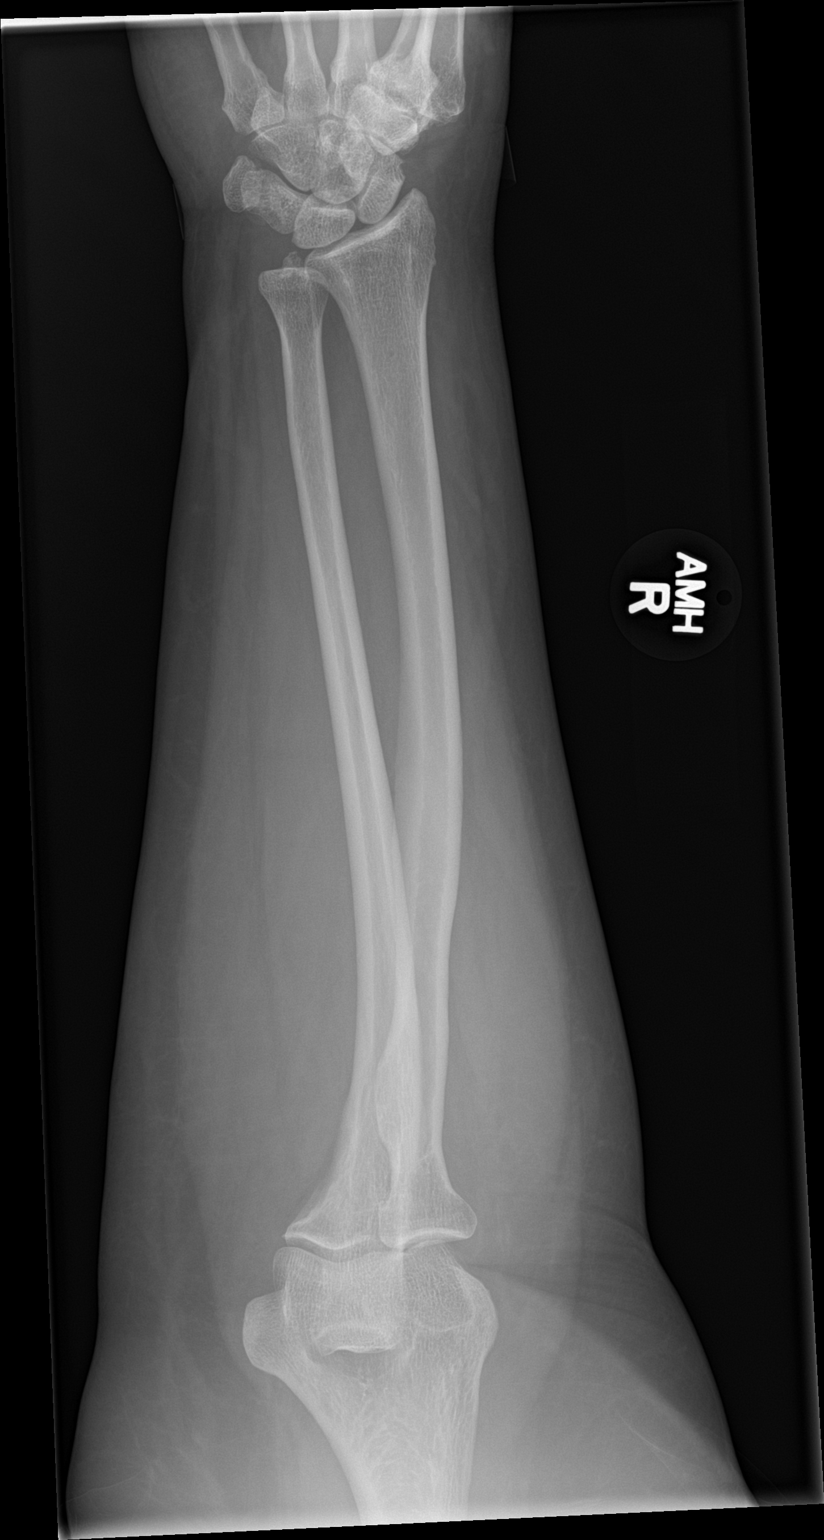

[forearm lat]
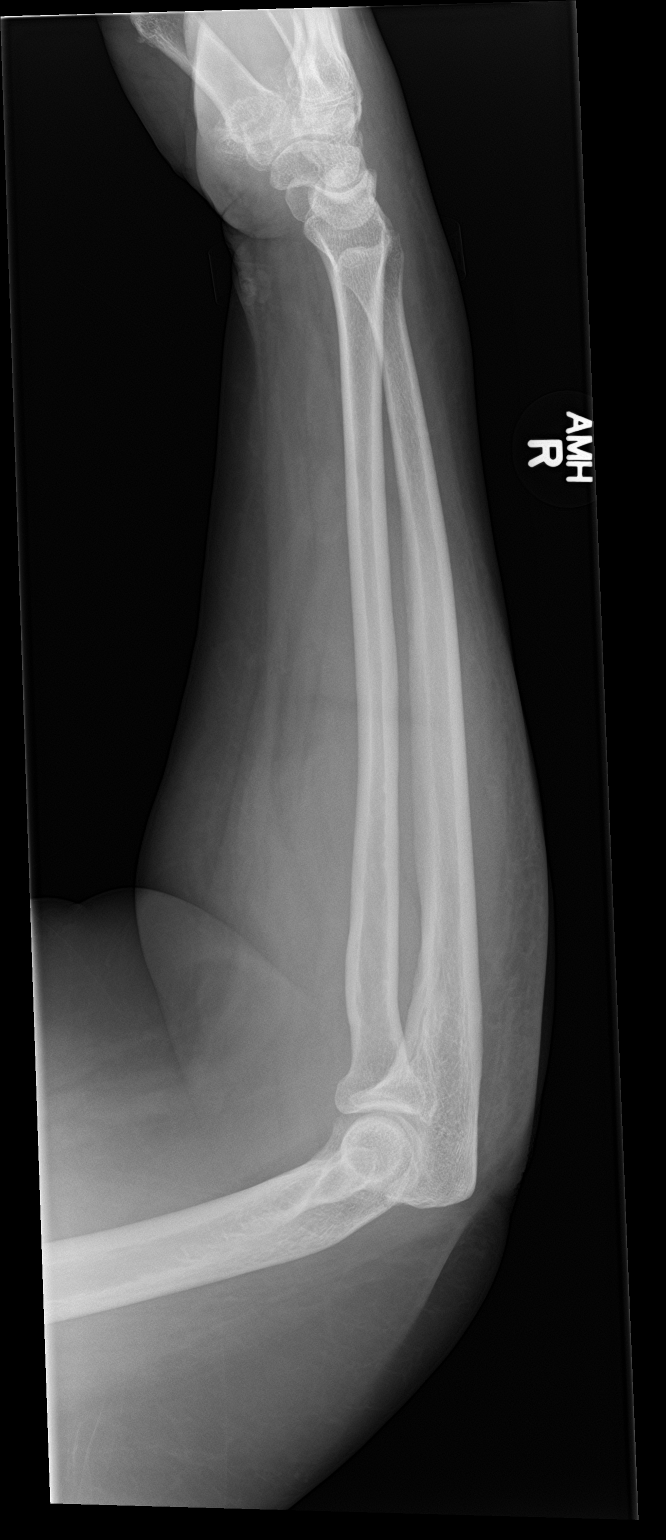

[2 of 2 positions shown; findings below may reference images not displayed]

FINDINGS: There is no evidence of fracture or other focal bone lesions. Mild
soft tissue swelling.
IMPRESSION: Mild soft tissue swelling.  No fracture.

## 2017-08-28 IMAGING — DX DG WRIST 2V*R*
2 series · 2 of 2 positions shown · non-contrast
Comparison: None.

CLINICAL DATA: Fell in [REDACTED].  Pain.

EXAM:
RIGHT WRIST - 2 VIEW

[wrist pa]
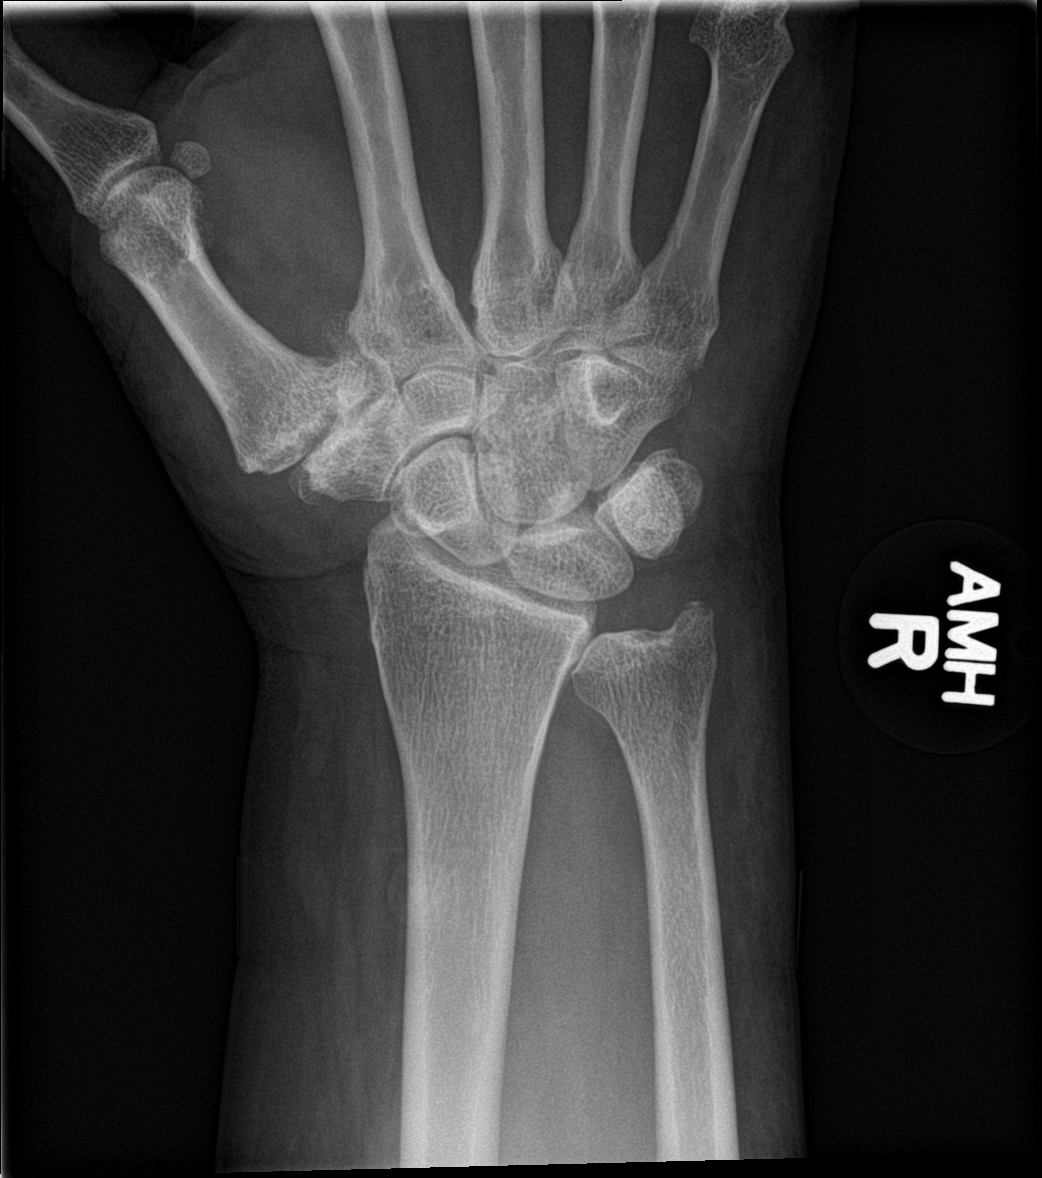

[wrist lat]
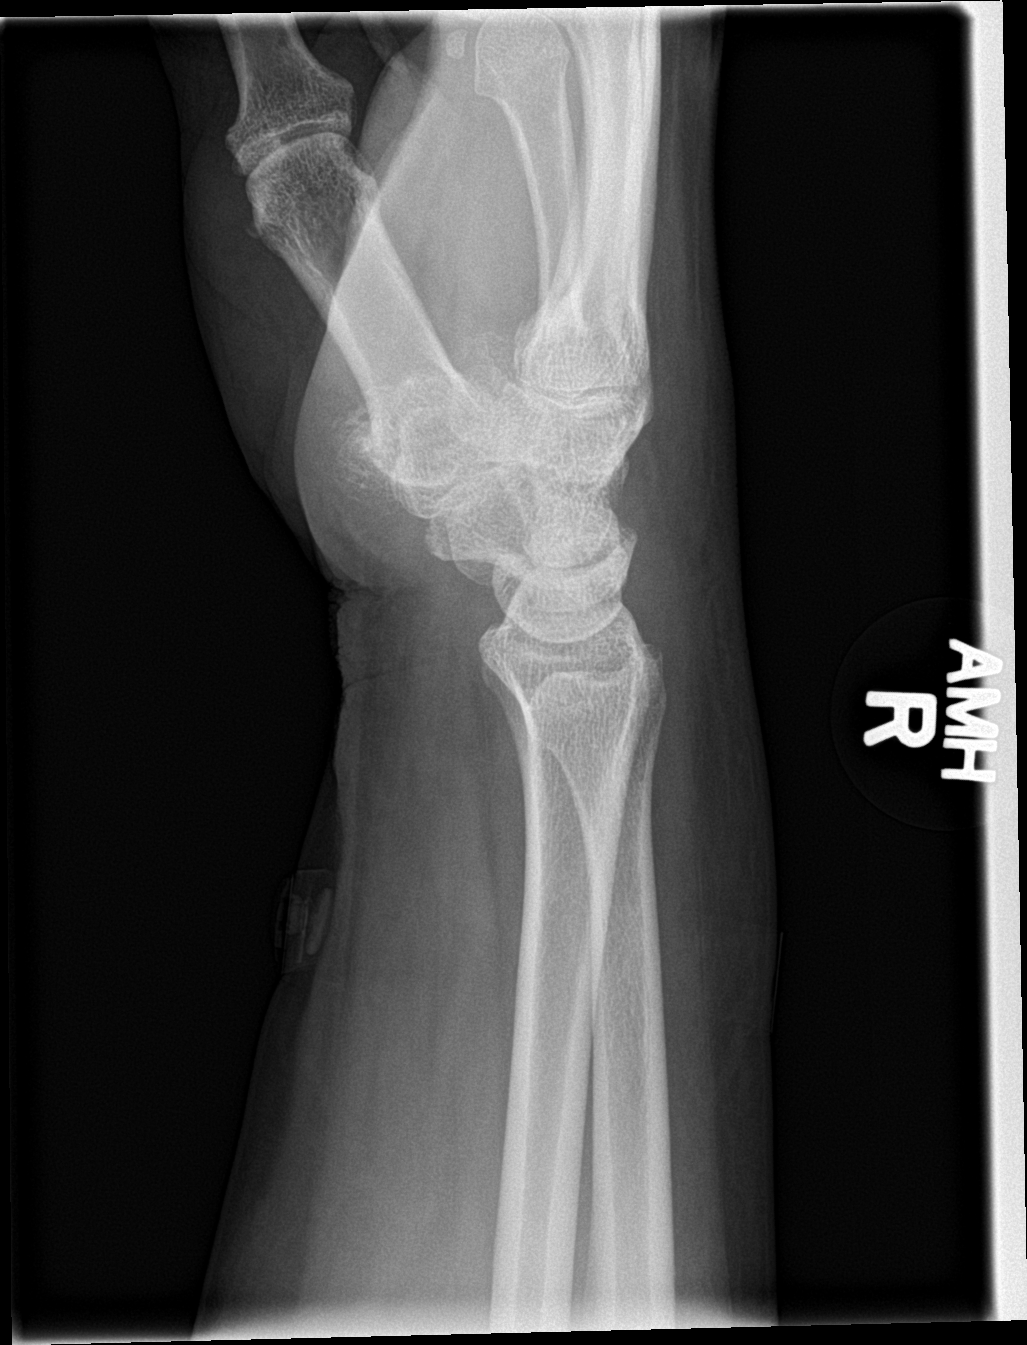

[2 of 2 positions shown; findings below may reference images not displayed]

FINDINGS: Mild irregularity of the distal ulnar styloid could represent a
healed fracture, There is significant joint space narrowing at the
radiocarpal, intercarpal, and carpometacarpal joints, particularly
the first CMC. No transverse radius fracture. Mild soft tissue
swelling.
IMPRESSION: Advanced degenerative change of the wrist.

Mild irregularity of the distal ulnar styloid could represent a
healed fracture.

## 2017-09-17 ENCOUNTER — Other Ambulatory Visit: Payer: Self-pay | Admitting: Endocrinology

## 2017-09-22 IMAGING — CT CT HEAD W/O CM
2 of 6 series · 4 of 47 positions shown, 5 images · non-contrast
Comparison: 02/29/2016

CLINICAL DATA: Multiple falls.

EXAM:
CT HEAD WITHOUT CONTRAST
TECHNIQUE: Contiguous axial images were obtained from the base of the skull
through the vertex without intravenous contrast.

[Series 206: coronal · coronal · 0.41mm/px · 3 of 66 slices shown, 4 images]
[im 17/66  brain]
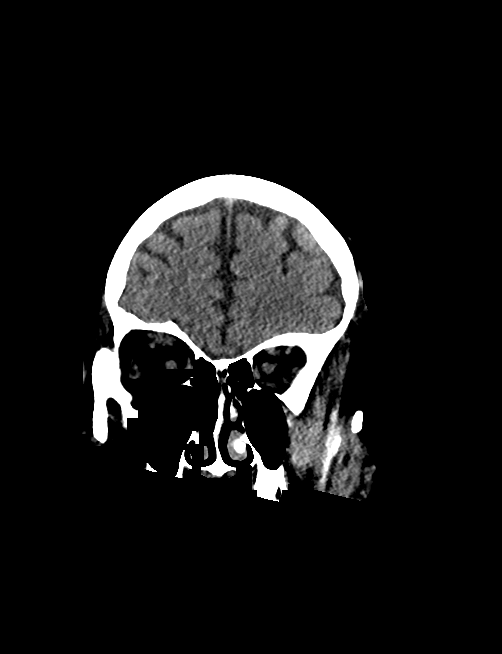
[im 17/66  bone]
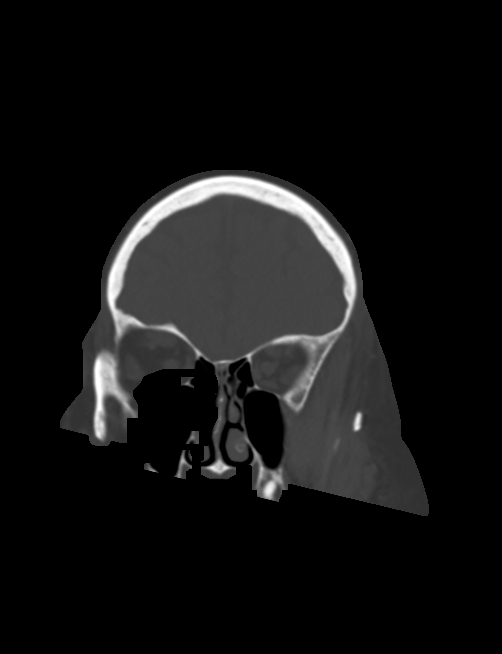
[im 33/66  brain]
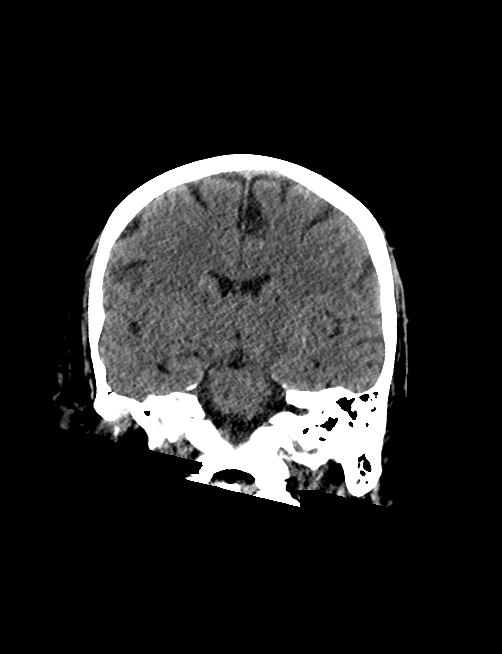
[im 49/66  brain]
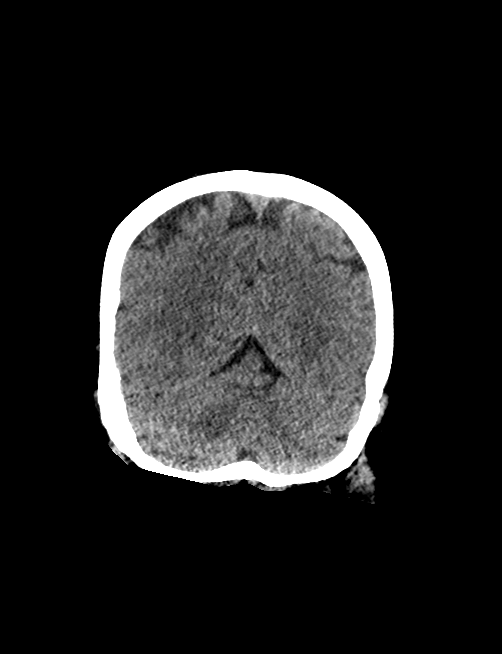

[Series 207: sag · sagittal · 0.40mm/px · 1 of 46 slices shown]
[im 23/46  brain]
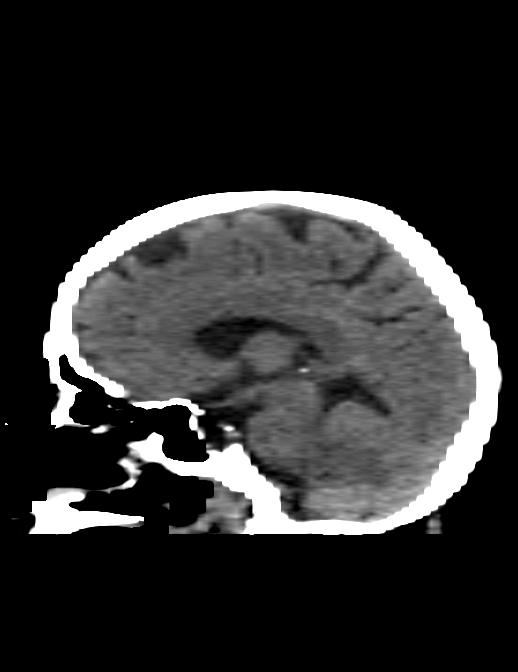

[4 of 47 positions shown; findings below may reference images not displayed]

FINDINGS: Brain: There is no evidence for acute hemorrhage, hydrocephalus,
mass lesion, or abnormal extra-axial fluid collection. No definite
CT evidence for acute infarction.

Vascular: No hyperdense vessel or unexpected calcification.

Skull: Normal. Negative for fracture or focal lesion.

Sinuses/Orbits: Visualized portions of the paranasal sinuses are
clear. Right mastoid air cells are clear. Fluid is visible in the
left mastoid air cells. Visualized portions of the globes and
intraorbital fat are unremarkable.

Other: None.
IMPRESSION: 1. No acute intracranial abnormality.
2. Left mastoid effusion.

## 2017-09-22 IMAGING — CR DG CHEST 2V
2 series · 2 of 2 positions shown · non-contrast
Comparison: 02/29/2016

CLINICAL DATA: Sepsis

EXAM:
CHEST  2 VIEW

[chest lat]
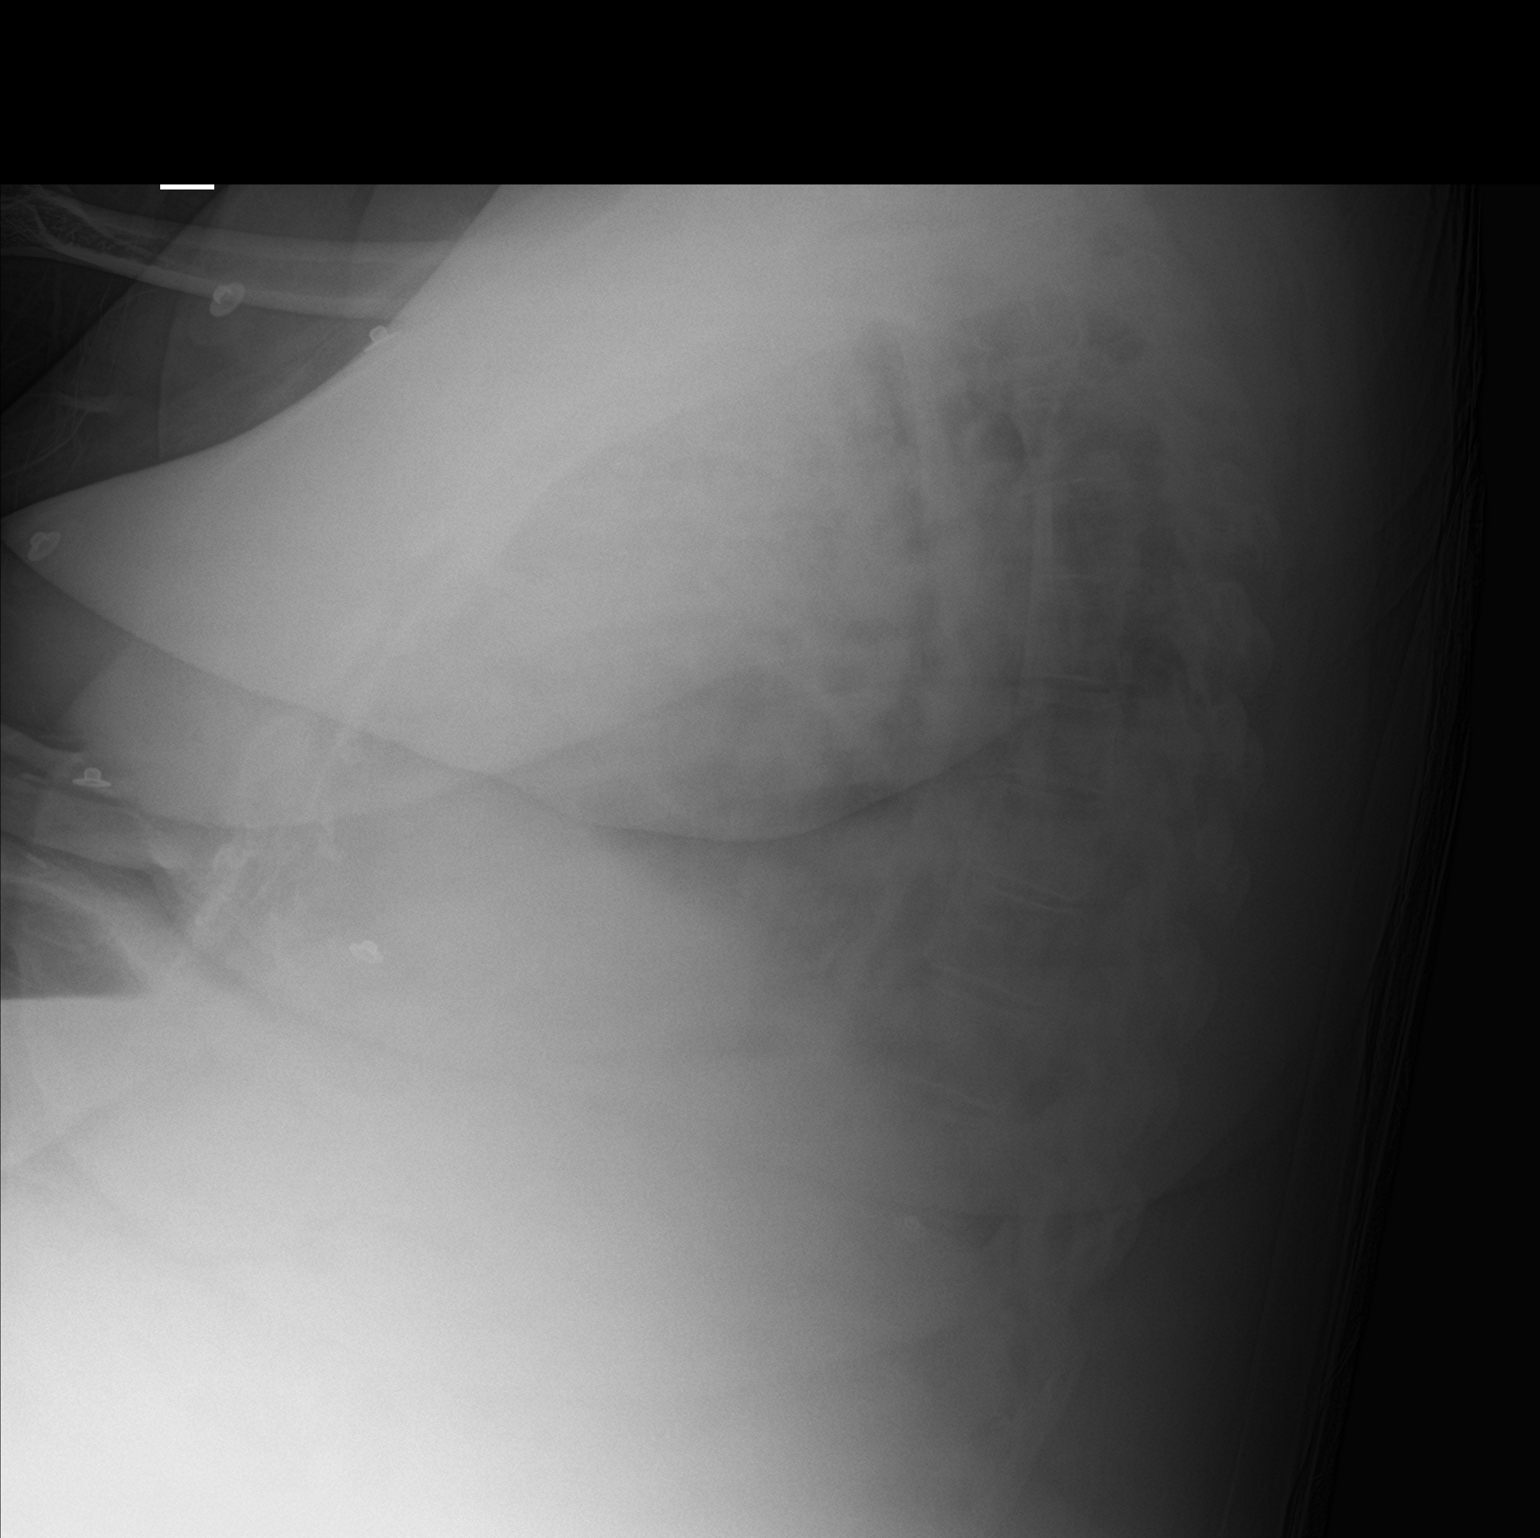

[chest ap]
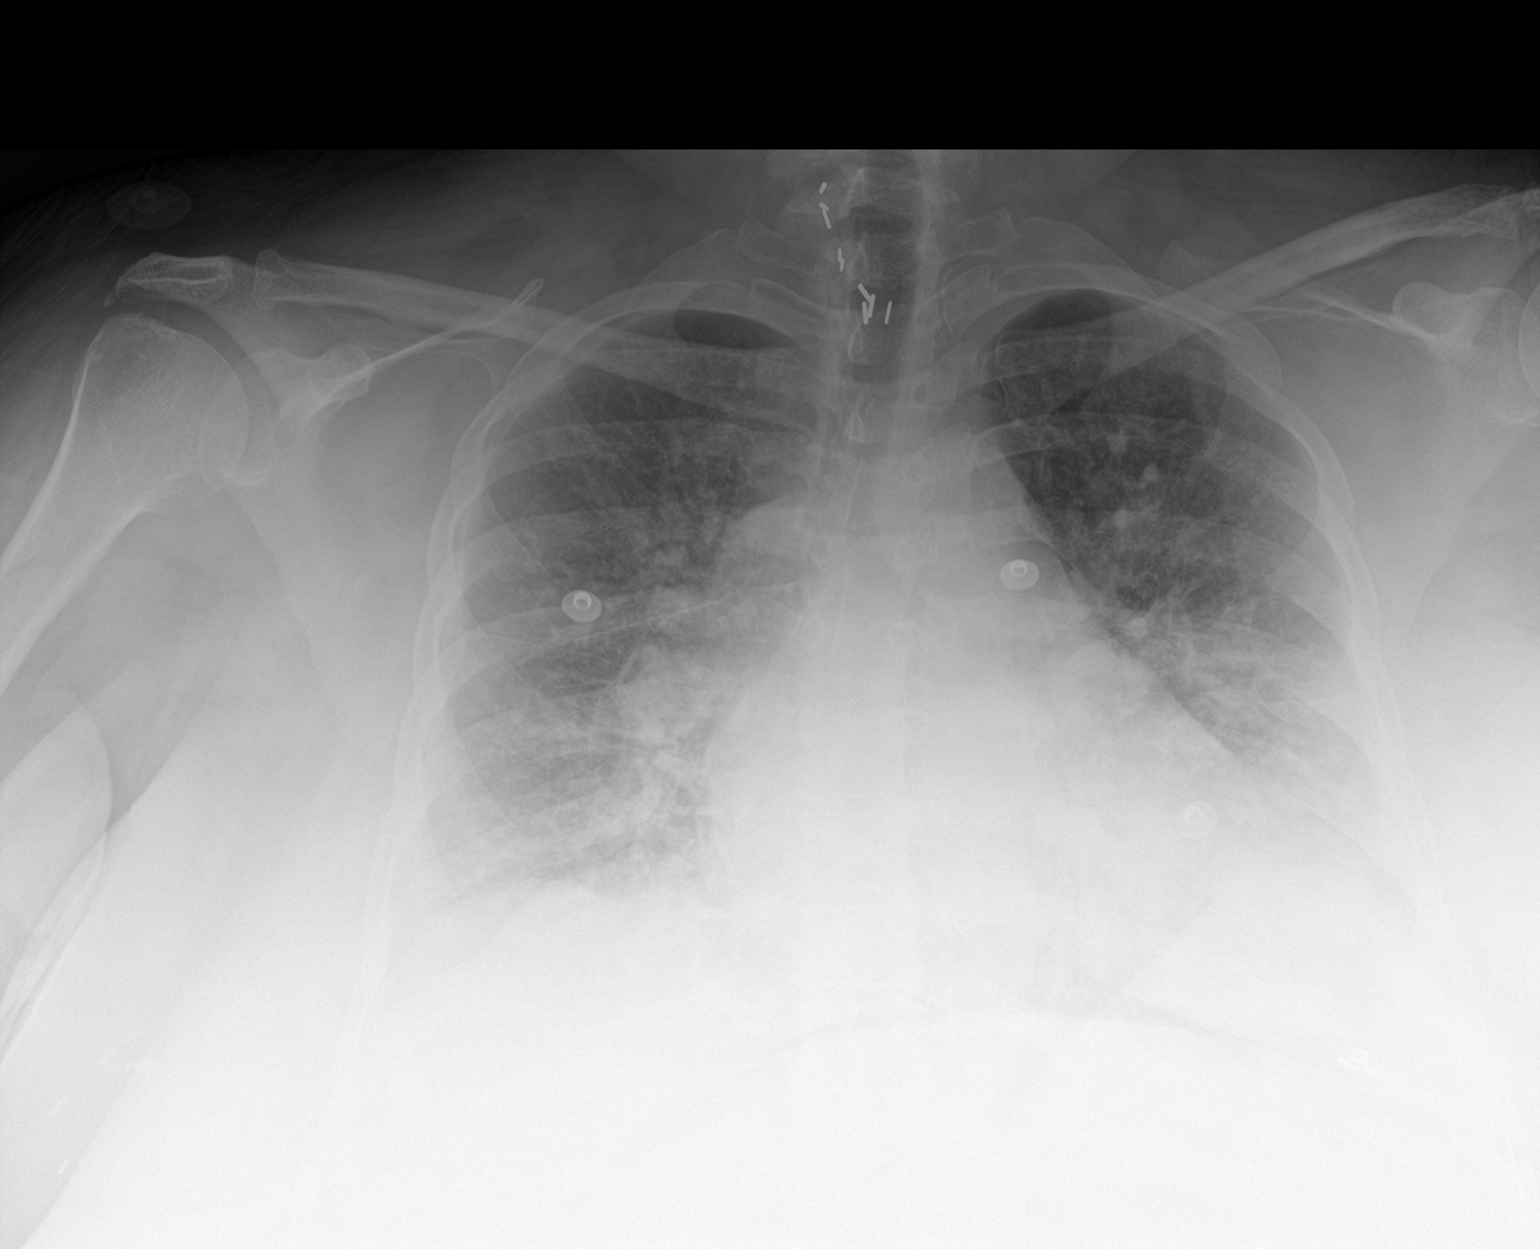

[2 of 2 positions shown; findings below may reference images not displayed]

FINDINGS: Low lung volumes. Borderline cardiomegaly. No aortic aneurysm. Mild
bilateral interstitial edema. Possible superimposed pneumonia at
each lung base left greater than right given slightly more confluent
airspace opacities noted. Surgical clips are seen at the base the
neck as before. No sizable effusion. No pneumothorax identified. No
acute osseous abnormality. Small calcification about the right
shoulder may reflect calcific tendinopathy.
IMPRESSION: Low lung volumes with borderline cardiomegaly and interstitial
edema. Subtle airspace opacities at the lung bases left greater than
right cannot exclude superimposed pneumonia.

## 2017-09-23 IMAGING — CT CT ABD-PELV W/O CM
2 of 4 series · 17 of 46 positions shown, 19 images · non-contrast
Comparison: 06/23/2015

CLINICAL DATA: Right flank pain

EXAM:
CT ABDOMEN AND PELVIS WITHOUT CONTRAST
TECHNIQUE: Multidetector CT imaging of the abdomen and pelvis was performed
following the standard protocol without IV contrast.

[Series 2: abd/ pelvis 5.0 i30f 1 · axial · 0.89mm/px · z∈[+804,+1264]mm · 14 of 102 slices shown, 16 images]
[im 5/102  soft-tissue]
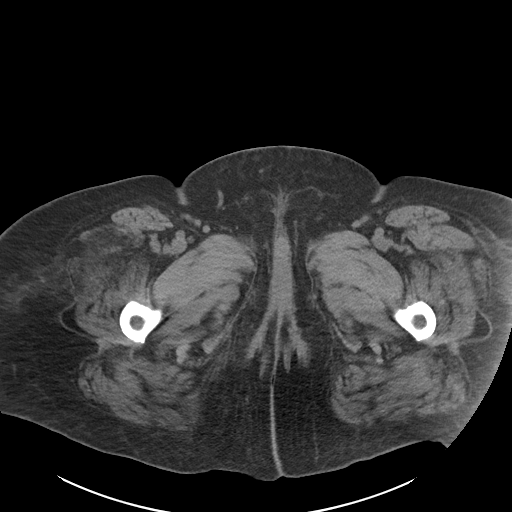
[im 5/102  bone]
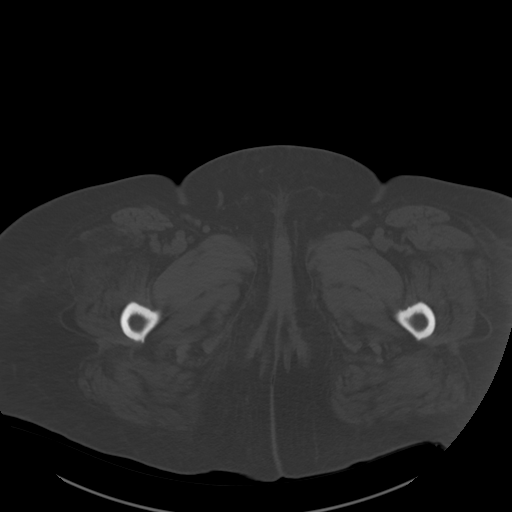
[im 13/102  soft-tissue]
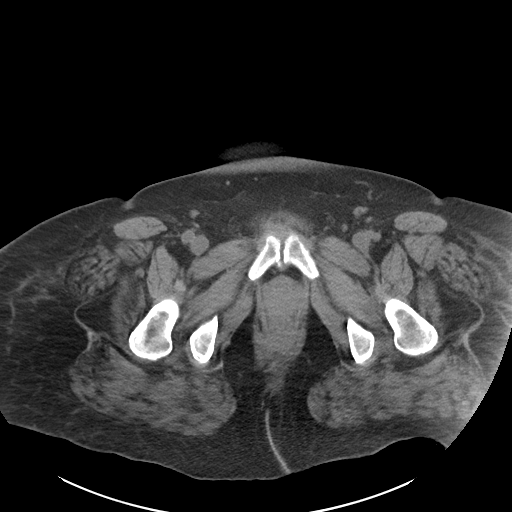
[im 22/102  soft-tissue]
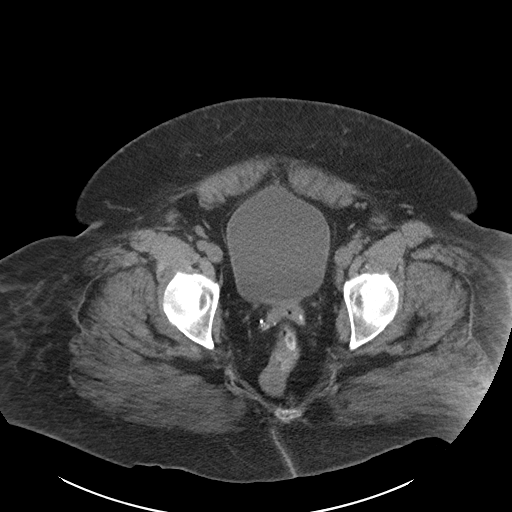
[im 26/102  soft-tissue]
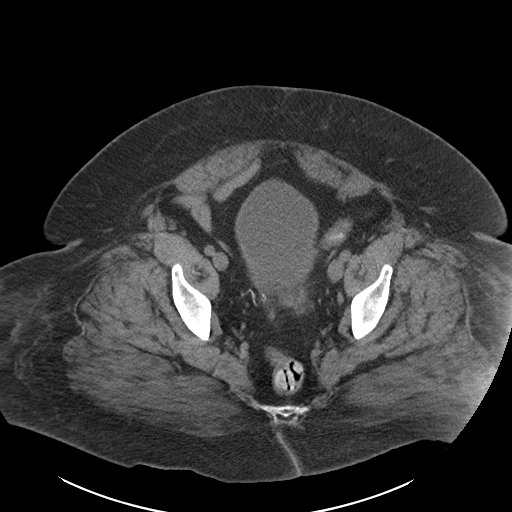
[im 34/102  soft-tissue]
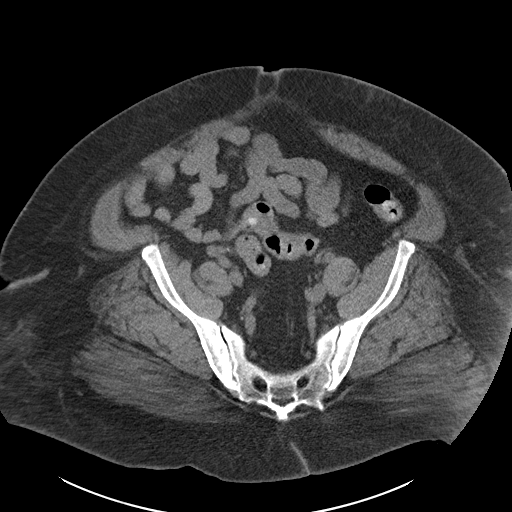
[im 43/102  soft-tissue]
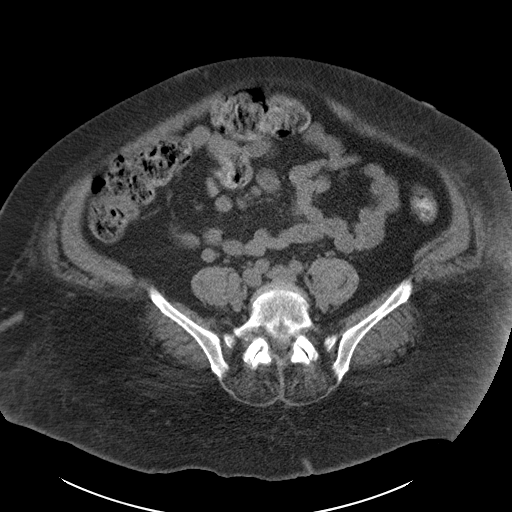
[im 47/102  soft-tissue]
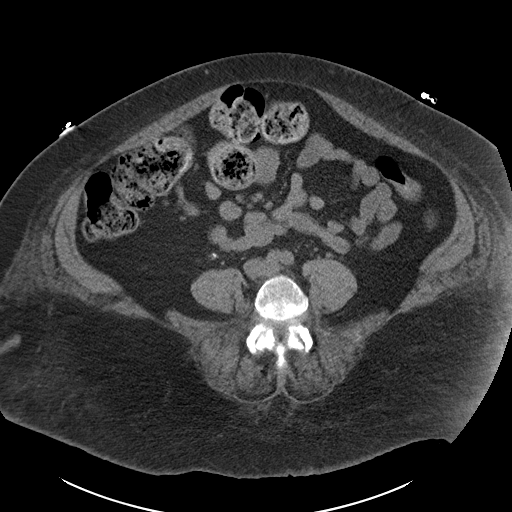
[im 55/102  soft-tissue]
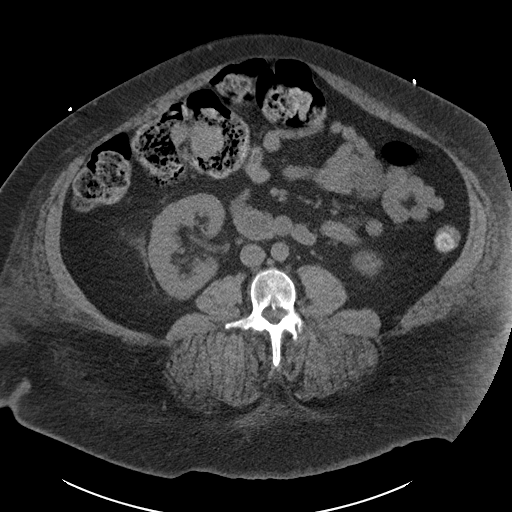
[im 59/102  soft-tissue]
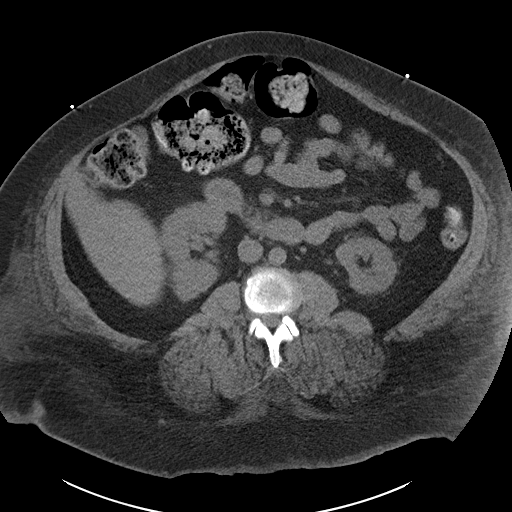
[im 59/102  bone]
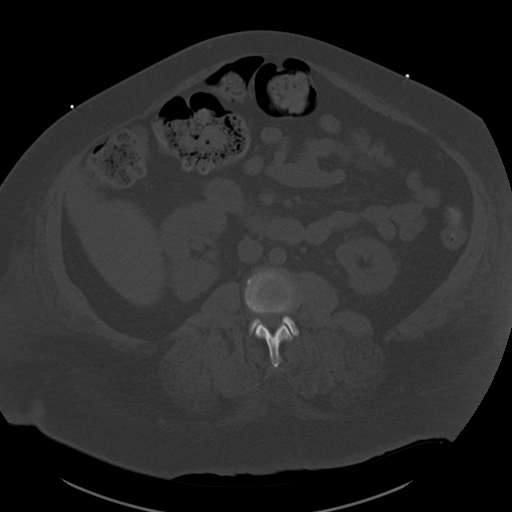
[im 68/102  soft-tissue]
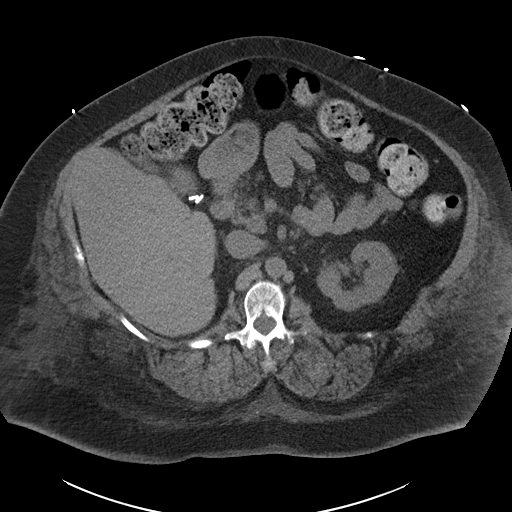
[im 76/102  soft-tissue]
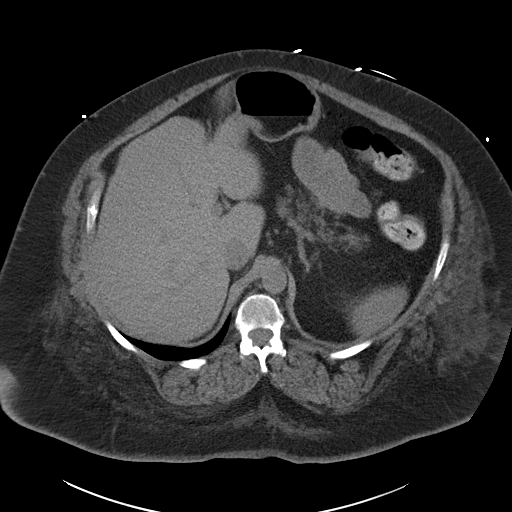
[im 80/102  soft-tissue]
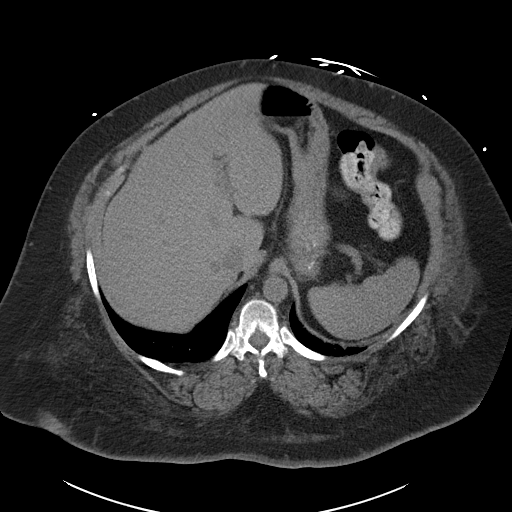
[im 89/102  soft-tissue]
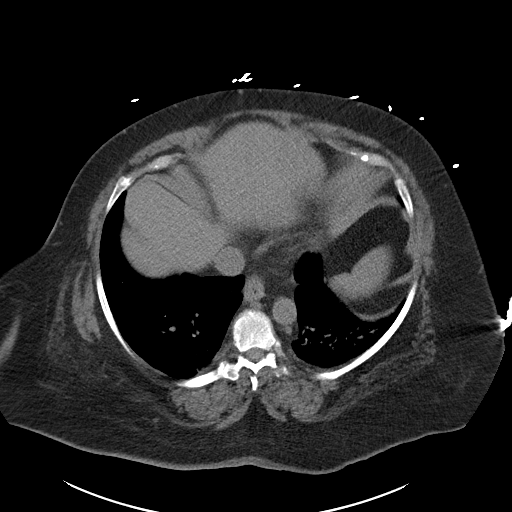
[im 97/102  soft-tissue]
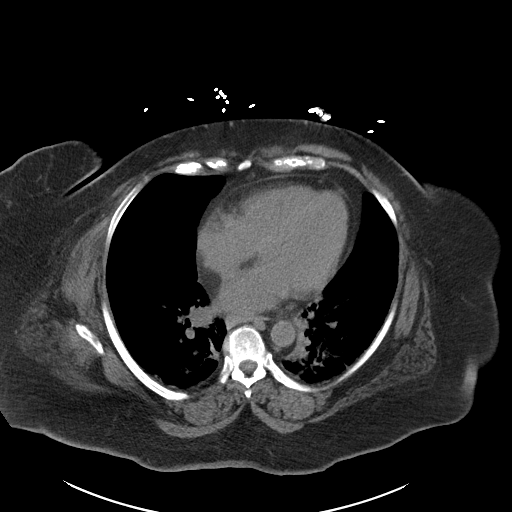

[Series 5: cor st · coronal · 0.96mm/px · 3 of 118 slices shown]
[im 40/118  soft-tissue]
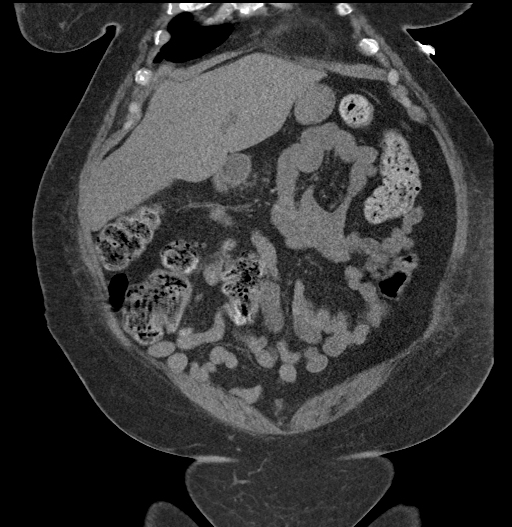
[im 53/118  soft-tissue]
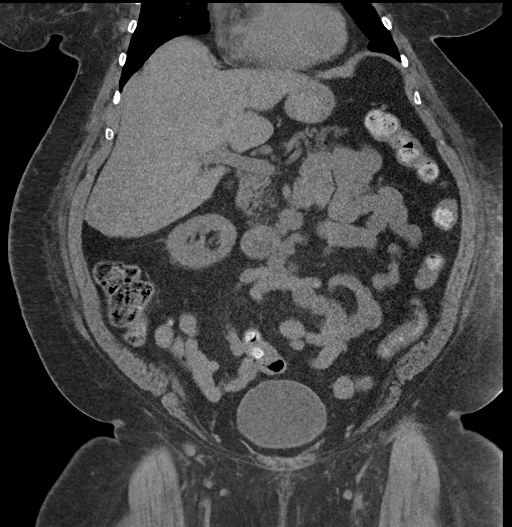
[im 66/118  soft-tissue]
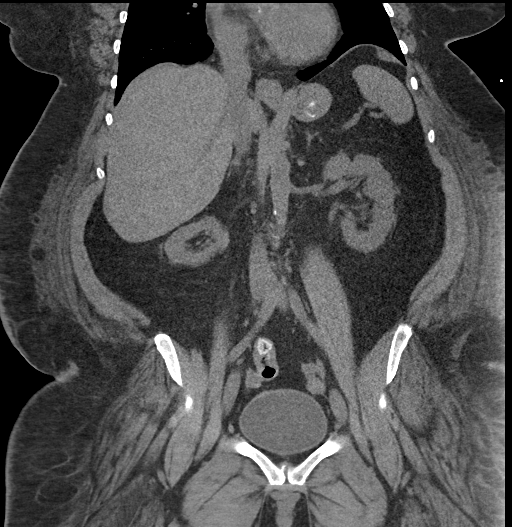

[17 of 46 positions shown; findings below may reference images not displayed]

FINDINGS: Lower chest: Mild ground-glass densities in the left greater than
right posterior lower lobes. No effusion. The heart appears
borderline enlarged.

Hepatobiliary: No focal liver abnormality is seen. Status post
cholecystectomy. No biliary dilatation.

Pancreas: Unremarkable. No pancreatic ductal dilatation or
surrounding inflammatory changes.

Spleen: Normal in size without focal abnormality.

Adrenals/Urinary Tract: Adrenal glands are unremarkable. Kidneys are
normal, without renal calculi, focal lesion, or hydronephrosis.
Bladder is unremarkable. Nonspecific perinephric fat stranding.

Stomach/Bowel: Stomach is within normal limits. Appendix contains a
few stones but is otherwise normal. No evidence of bowel wall
thickening, distention, or inflammatory changes. Large amount of
stool in the right and transverse colon.

Vascular/Lymphatic: Aortic atherosclerosis. No enlarged abdominal or
pelvic lymph nodes.

Reproductive: Status post hysterectomy. No adnexal masses.

Other: Small fat containing umbilical hernia. No free air or free
fluid.

Musculoskeletal: Degenerative changes. No acute or suspicious bone
lesions.
IMPRESSION: 1. Negative for nephrolithiasis hydronephrosis or ureteral stone.
2. Mild density in the left greater than right posterior lower lobes
could reflect pneumonia or possible aspiration.

## 2017-09-24 IMAGING — NM NM PULMONARY VENT & PERF
15 series · 15 of 15 positions shown · non-contrast
Comparison: None.

CLINICAL DATA: Acute respiratory failure, hypoxia, cough

EXAM:
NUCLEAR MEDICINE VENTILATION - PERFUSION LUNG SCAN
TECHNIQUE: Ventilation images were obtained in multiple projections using
inhaled aerosol Vc-NNm DTPA. Perfusion images were obtained in
multiple projections after intravenous injection of Vc-NNm MAA.
RADIOPHARMACEUTICALS:  32.5 mCi Yechnetium-XXm DTPA aerosol
inhalation and 4.32 mCi Yechnetium-XXm MAA IV

[Series 1: ant/post vent · 4.14mm/px · 1 of 1 slices shown (1 of 2)]
[im 1/1  full-range]
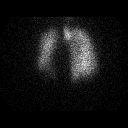

[Series 1: ant/post vent · 4.14mm/px · 1 of 1 slices shown (2 of 2)]
[im 1/1]
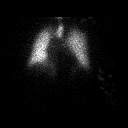

[Series 2: lao/rpo vent · 4.14mm/px · 1 of 1 slices shown (1 of 2)]
[im 1/1  full-range]
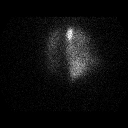

[Series 2: lao/rpo vent · 4.14mm/px · 1 of 1 slices shown (2 of 2)]
[im 1/1  full-range]
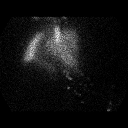

[Series 3: lpo/rao vent · 4.14mm/px · 1 of 1 slices shown (1 of 2)]
[im 1/1  full-range]
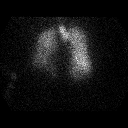

[Series 3: lpo/rao vent · 4.14mm/px · 1 of 1 slices shown (2 of 2)]
[im 1/1]
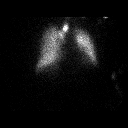

[Series 4: lt lat/rt lat vent · 4.14mm/px · 1 of 1 slices shown (1 of 2)]
[im 1/1]
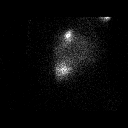

[Series 4: lt lat/rt lat vent · 4.14mm/px · 1 of 1 slices shown (2 of 2)]
[im 1/1]
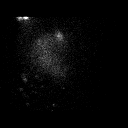

[Series 5: lt lat/rt lat perf · 4.14mm/px · 1 of 1 slices shown (1 of 2)]
[im 1/1]
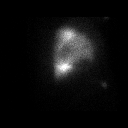

[Series 5: lt lat/rt lat perf · 4.14mm/px · 1 of 1 slices shown (2 of 2)]
[im 1/1]
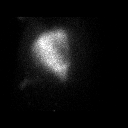

[Series 6: lpo/rao perf · 4.14mm/px · 1 of 1 slices shown (1 of 2)]
[im 1/1]
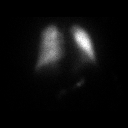

[Series 6: lpo/rao perf · 4.14mm/px · 1 of 1 slices shown (2 of 2)]
[im 1/1]
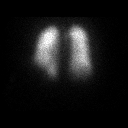

[Series 7: ant/post perf · 4.14mm/px · 1 of 1 slices shown]
[im 1/1]
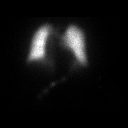

[Series 8: lao/rpo perf · 4.14mm/px · 1 of 1 slices shown (1 of 2)]
[im 1/1]
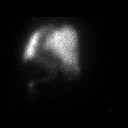

[Series 8: lao/rpo perf · 4.14mm/px · 1 of 1 slices shown (2 of 2)]
[im 1/1]
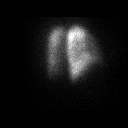

[15 of 15 positions shown; findings below may reference images not displayed]

FINDINGS: Ventilation: Mildly heterogeneous.  No focal ventilation defect.

Perfusion: No wedge shaped peripheral perfusion defects to suggest
acute pulmonary embolism.
IMPRESSION: Negative for pulmonary embolism.

## 2017-09-27 IMAGING — CR DG ABD PORTABLE 1V
1 series · 1 of 1 positions shown · non-contrast
Comparison: CT 03/28/2016.

CLINICAL DATA: Vomiting episodes.  Abdominal pain .

EXAM:
PORTABLE ABDOMEN - 1 VIEW

[AP]
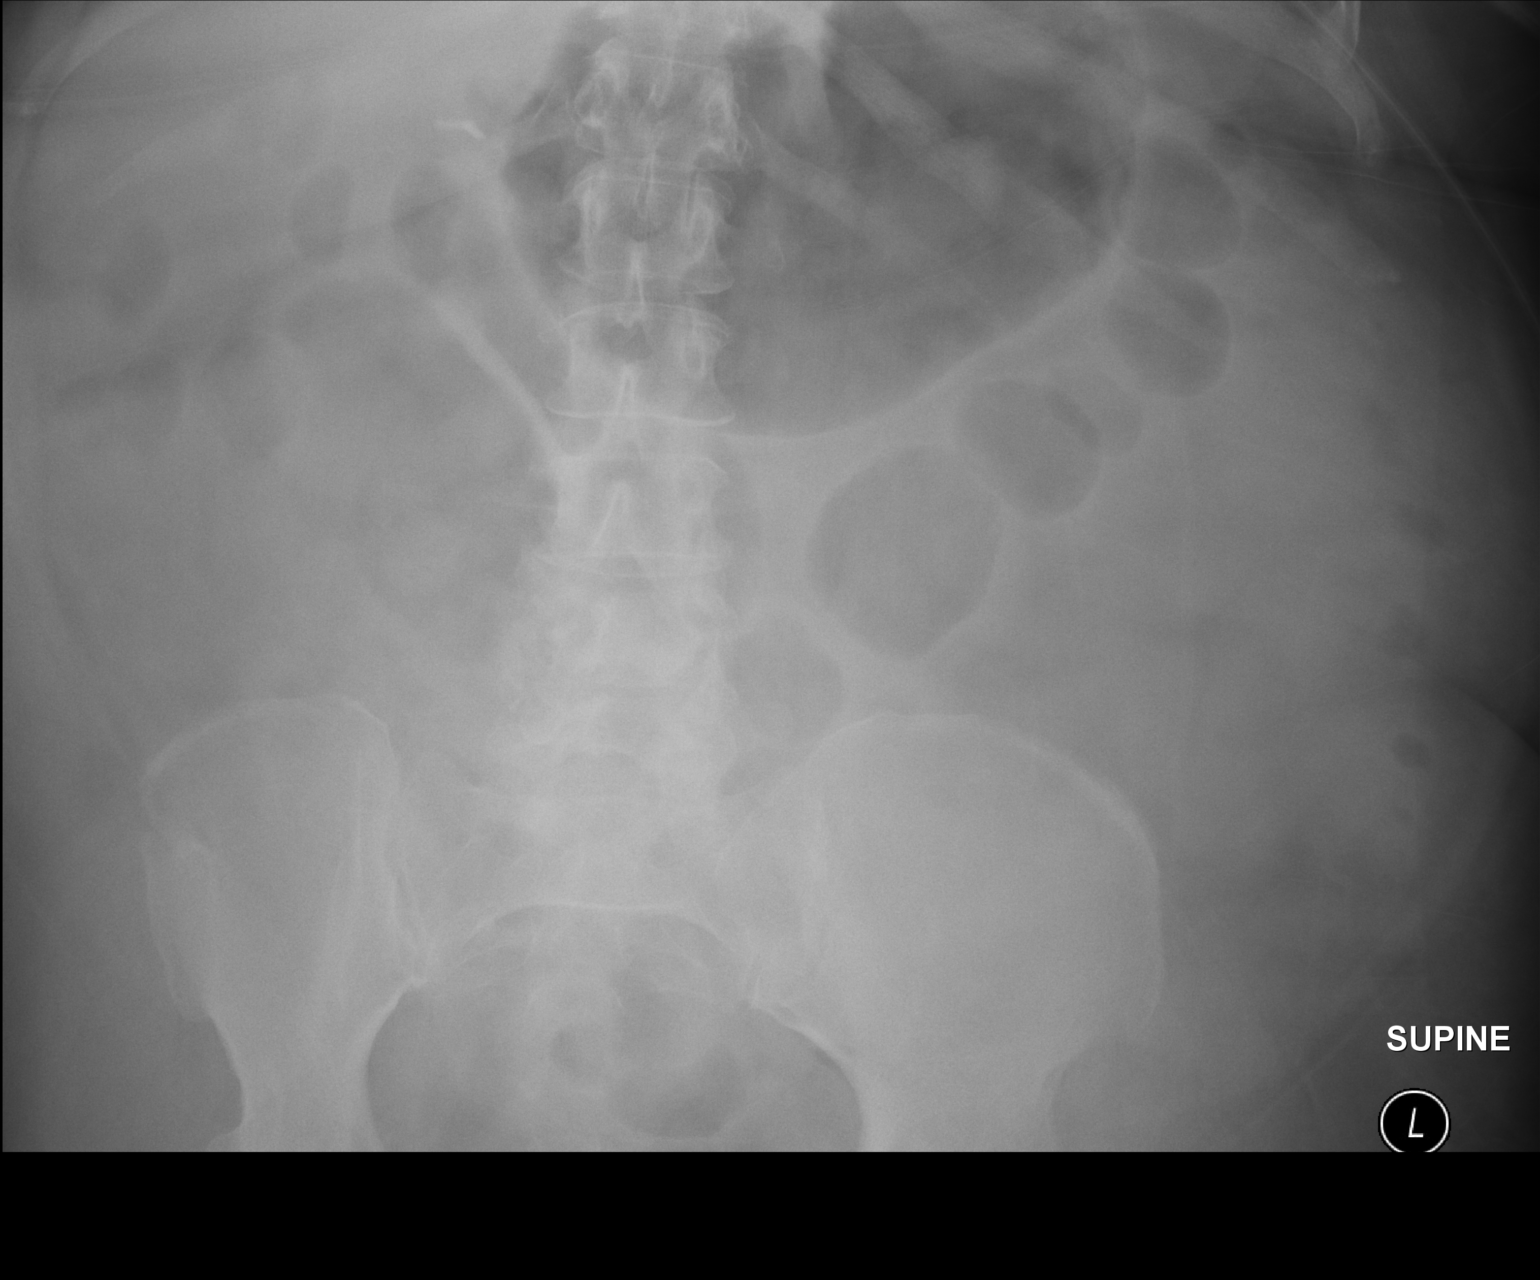

[1 of 1 positions shown; findings below may reference images not displayed]

FINDINGS: Soft tissue structures are unremarkable. Surgical clips right upper
quadrant. No bowel distention. No free air. Calcifications in pelvis
consistent phleboliths. Degenerative changes lumbar spine and both
hips.
IMPRESSION: No acute or focal abnormality.

## 2017-09-27 IMAGING — CT CT HEAD W/O CM
4 series · 16 of 47 positions shown, 18 images · non-contrast
Comparison: CT of the head performed 03/27/2016, and MRI of the
brain performed 02/29/2016

CLINICAL DATA: Acute onset of altered mental status. Initial
encounter.

EXAM:
CT HEAD WITHOUT CONTRAST
TECHNIQUE: Contiguous axial images were obtained from the base of the skull
through the vertex without intravenous contrast.

[Series 2: head without · axial · non-contrast · 0.42mm/px · z∈[-139,-24]mm · 7 of 31 slices shown, 9 images]
[im 4/31  brain]
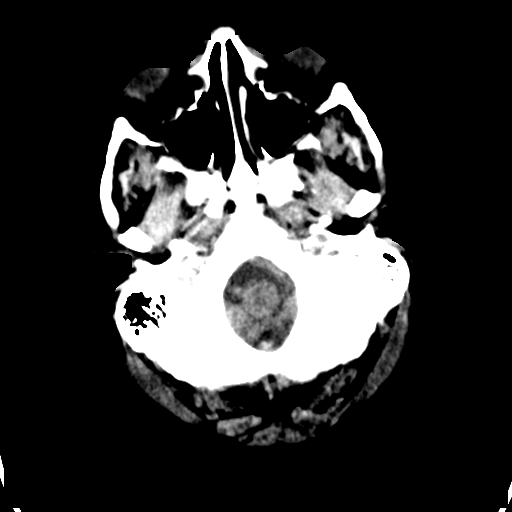
[im 4/31  bone]
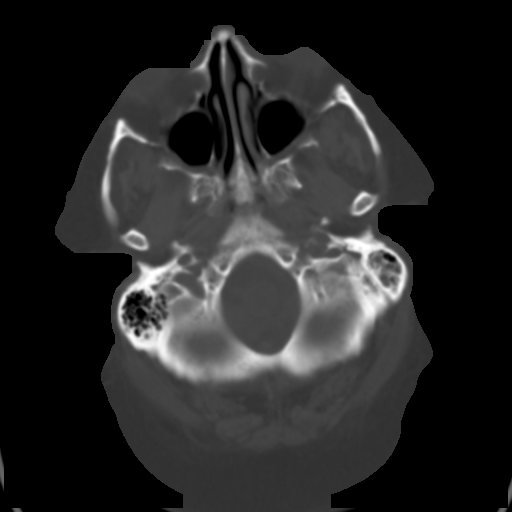
[im 8/31  brain]
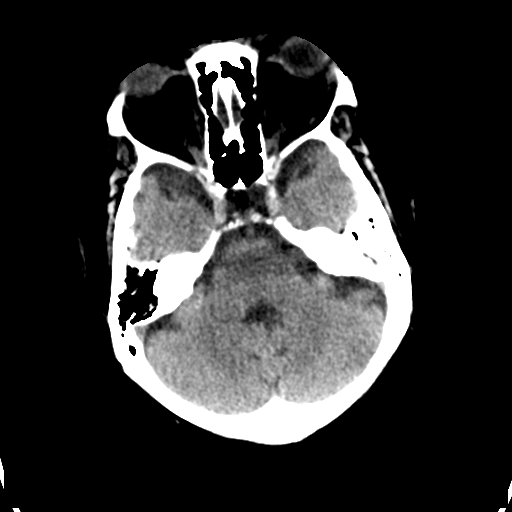
[im 12/31  brain]
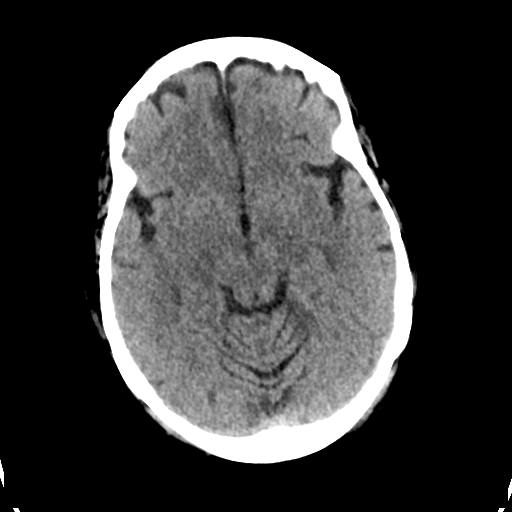
[im 16/31  brain]
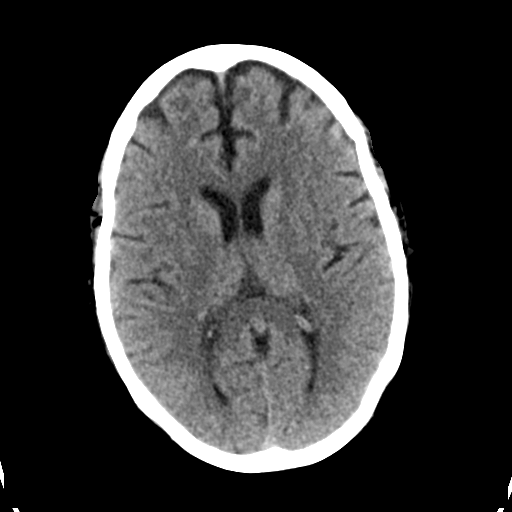
[im 19/31  brain]
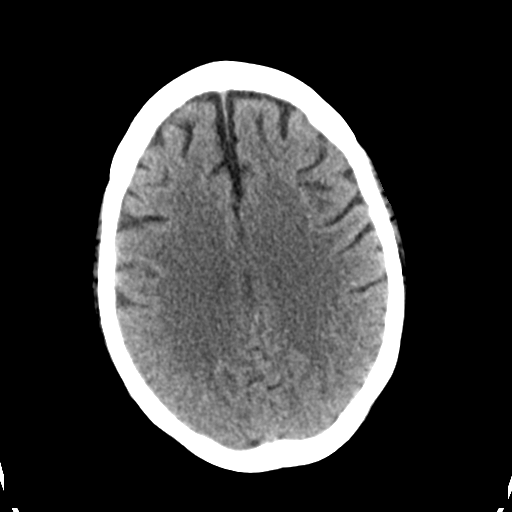
[im 19/31  bone]
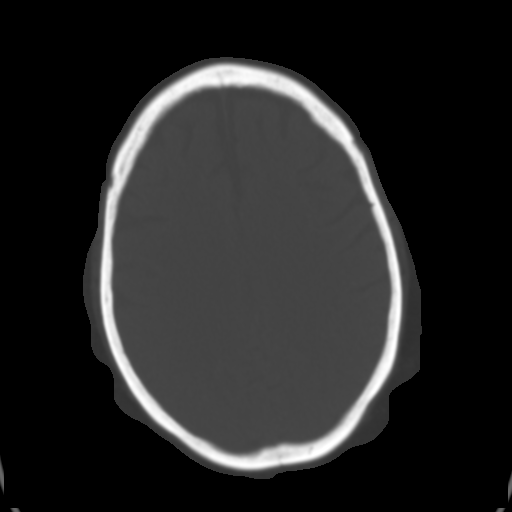
[im 23/31  brain]
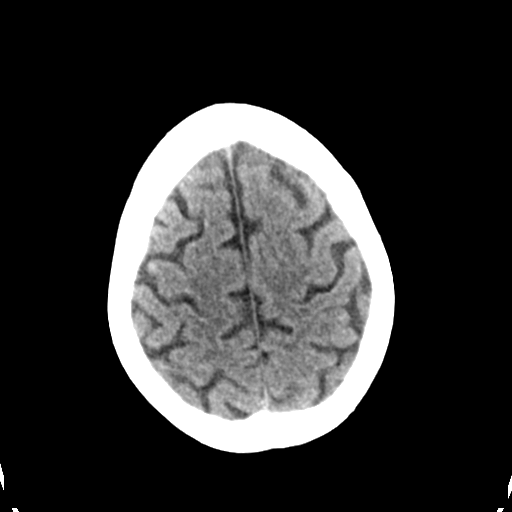
[im 27/31  brain]
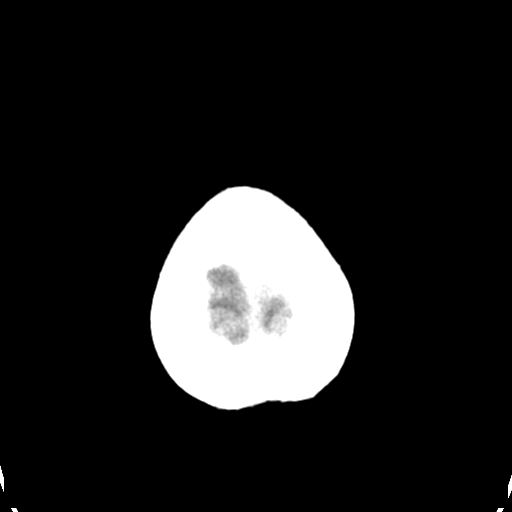

[Series 3: head bone · axial · 0.42mm/px · z∈[-140,-108]mm · 3 of 78 slices shown]
[im 8/78  bone]
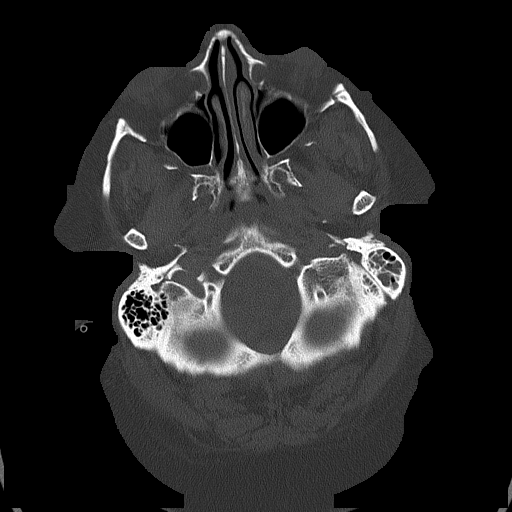
[im 16/78  bone]
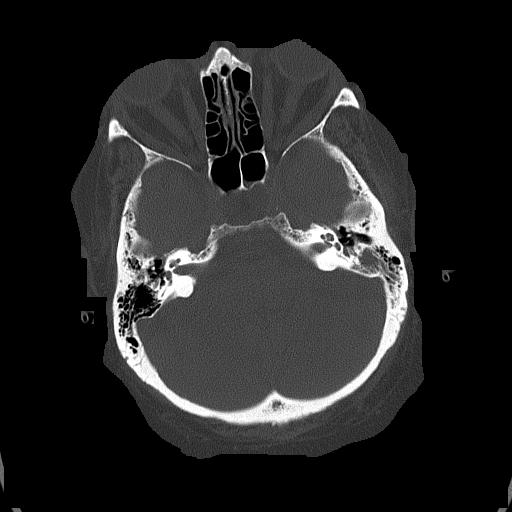
[im 24/78  bone]
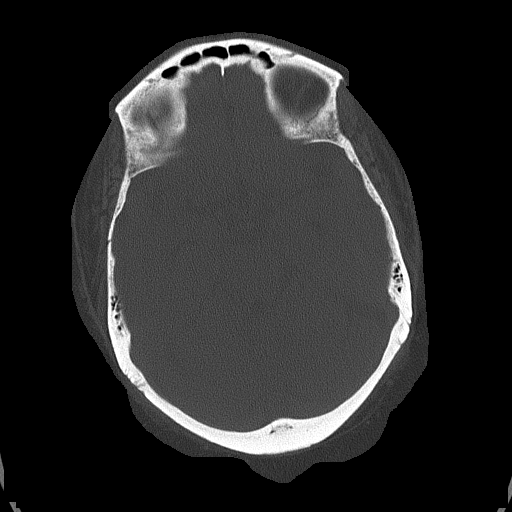

[Series 4: head without cor · coronal · non-contrast · 0.28mm/px · 3 of 65 slices shown]
[im 22/65  brain]
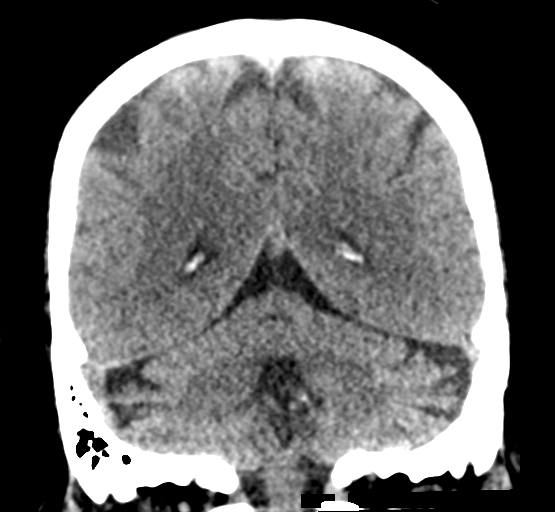
[im 29/65  brain]
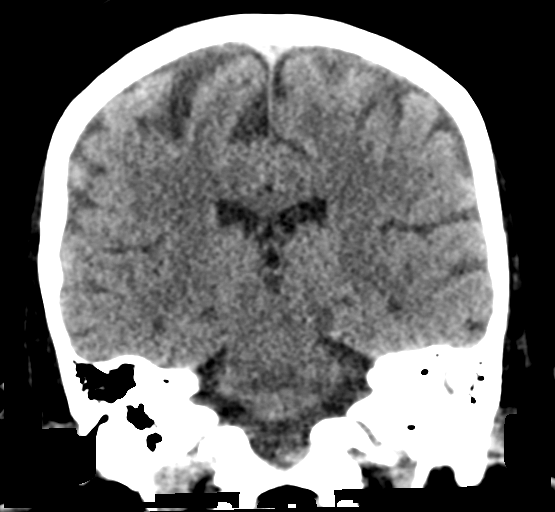
[im 36/65  brain]
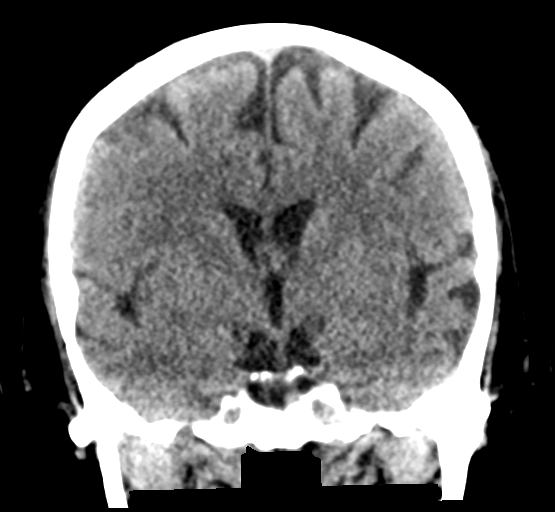

[Series 5: head without sag · sagittal · non-contrast · 0.29mm/px · 3 of 56 slices shown]
[im 19/56  brain]
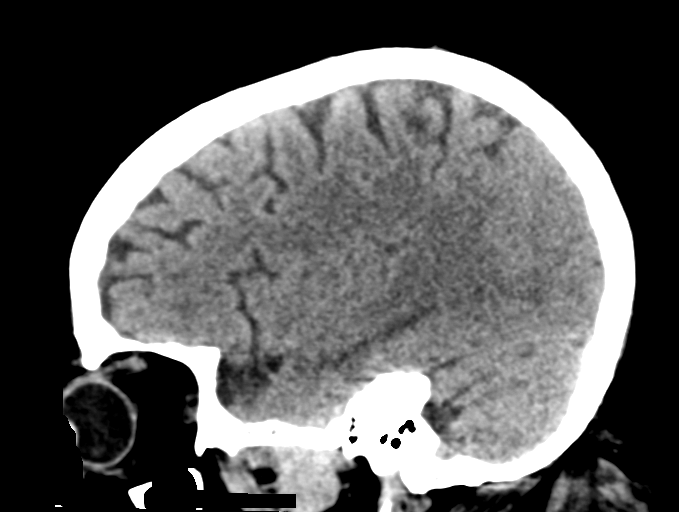
[im 28/56  brain]
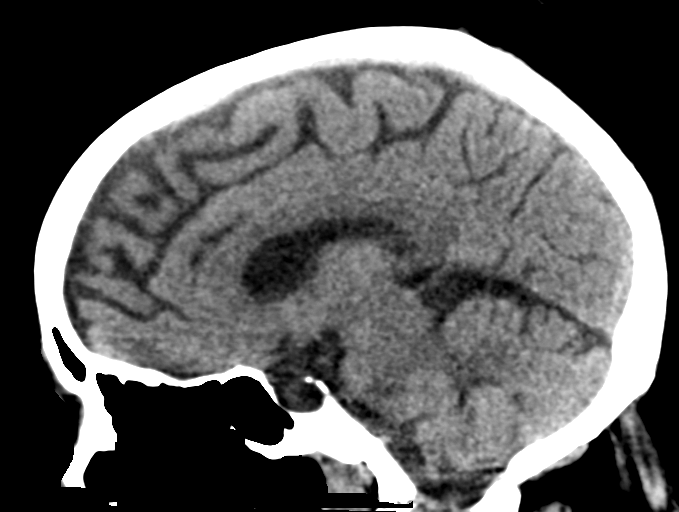
[im 37/56  brain]
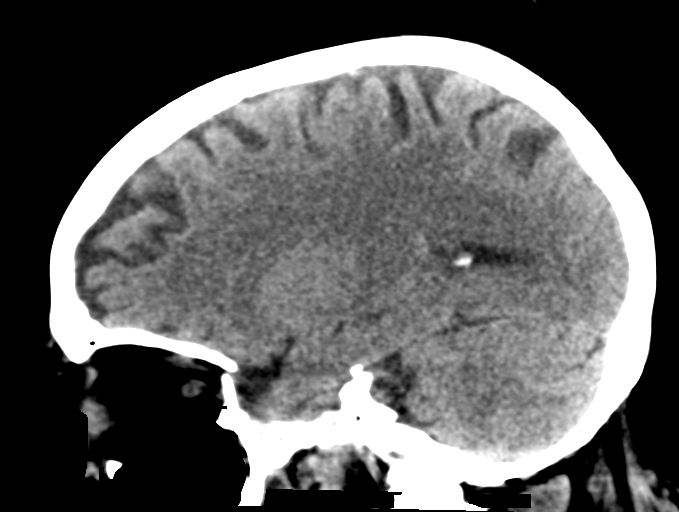

[16 of 47 positions shown; findings below may reference images not displayed]

FINDINGS: Brain: No evidence of acute infarction, hemorrhage, hydrocephalus,
extra-axial collection or mass lesion/mass effect.

Prominence of the ventricles and sulci reflects mild cortical volume
loss. Mild cerebellar atrophy is noted.

The brainstem and fourth ventricle are within normal limits. The
basal ganglia are unremarkable in appearance. The cerebral
hemispheres demonstrate grossly normal gray-white differentiation.
No mass effect or midline shift is seen.

Vascular: No hyperdense vessel or unexpected calcification.

Skull: There is no evidence of fracture; visualized osseous
structures are unremarkable in appearance. A mildly prominent empty
sella is noted.

Sinuses/Orbits: The orbits are within normal limits. The paranasal
sinuses and mastoid air cells are well-aerated.

Other: No significant soft tissue abnormalities are seen. Prominent
cerumen is seen at the external auditory canals bilaterally, larger
on the right.
IMPRESSION: 1. No acute intracranial pathology seen on CT.
2. Mild cortical volume loss noted.
3. Prominent cerumen at the external auditory canals bilaterally.

## 2017-09-28 IMAGING — DX DG ABDOMEN 1V
1 series · 1 of 1 positions shown · non-contrast
Comparison: 04/01/2016

CLINICAL DATA: Nausea and vomiting starting yesterday

EXAM:
ABDOMEN - 1 VIEW

[abdomen kub]
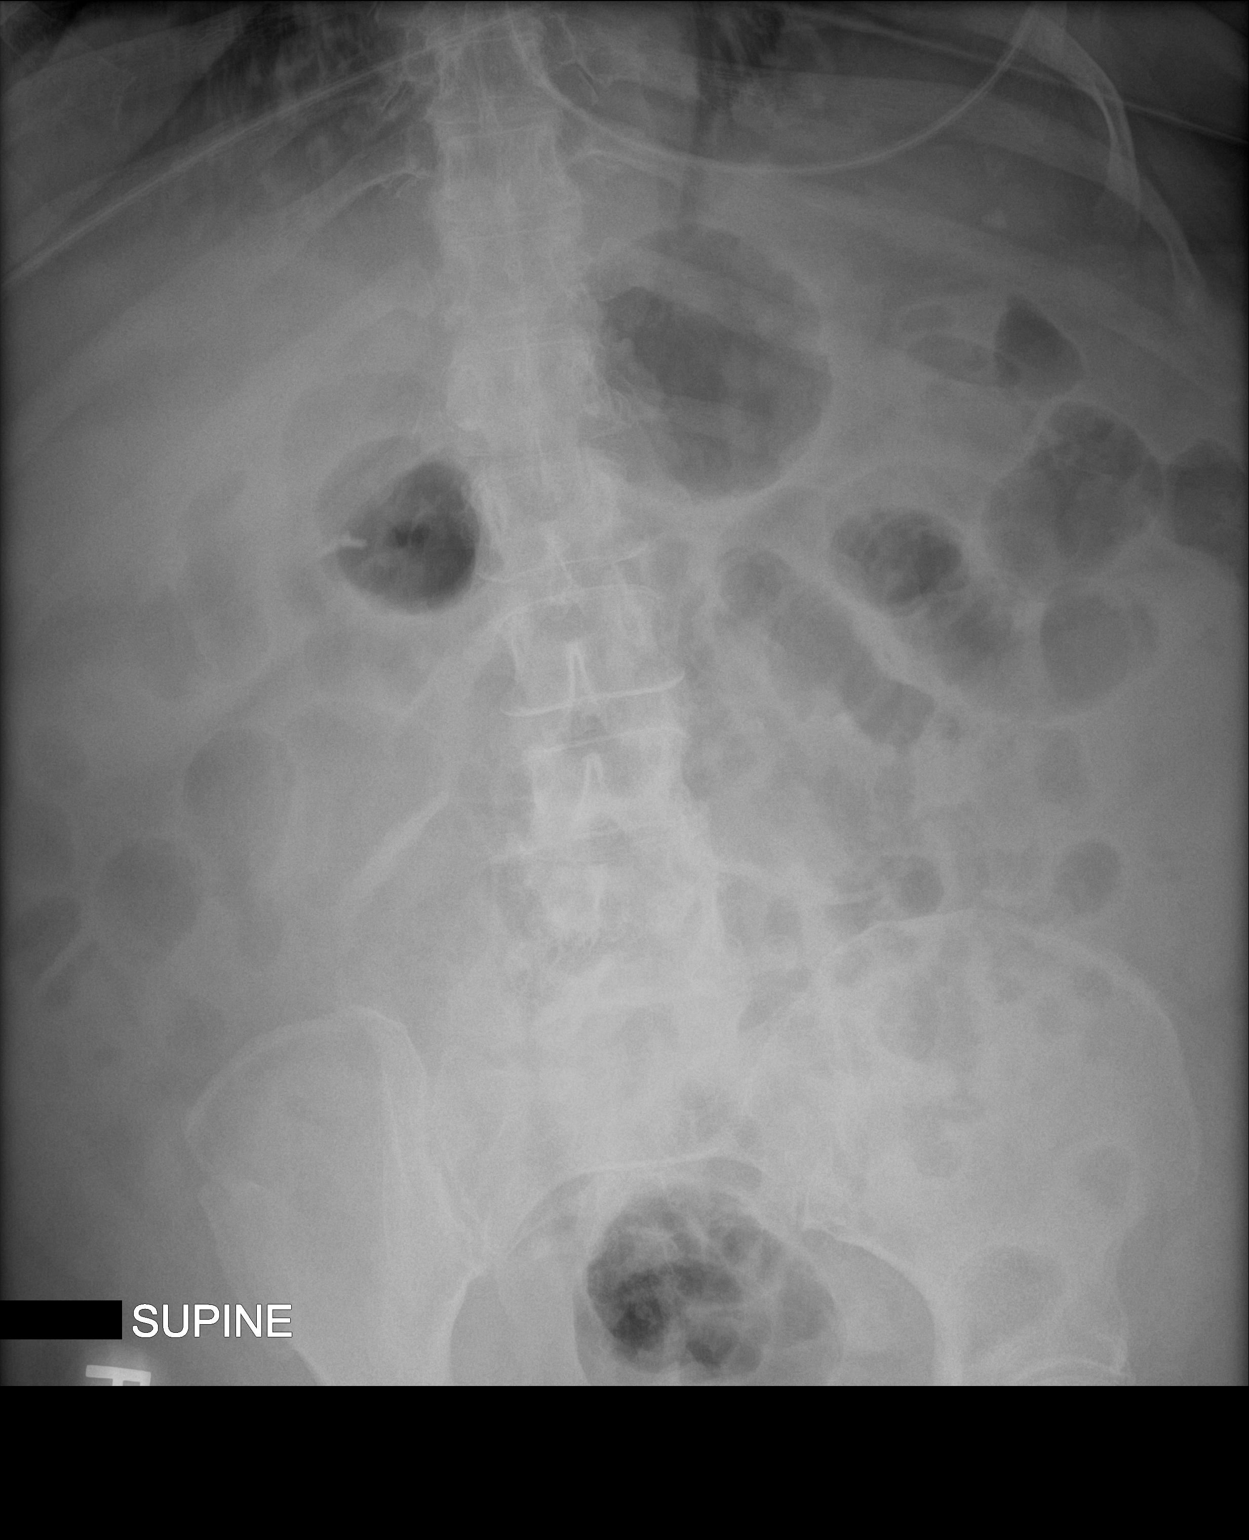

[1 of 1 positions shown; findings below may reference images not displayed]

FINDINGS: Mild gaseous distended small bowel loops are noted in left mid
abdomen suspicious for ileus, enteritis or early bowel obstruction.
Some colonic gas noted in right colon and transverse colon. Colonic
gas noted within rectum.
IMPRESSION: Mild gaseous distended small bowel loops in left mid abdomen
suspicious for mild ileus enteritis or less likely small bowel
obstruction.

## 2017-09-28 IMAGING — CT CT ABD-PELV W/ CM
2 of 5 series · 17 of 46 positions shown, 19 images · IV contrast (Omni 300)
Comparison: Same day K[facility], CT from 03/28/2016

CLINICAL DATA: Pneumonia, altered mental status.  Abdominal pain.

EXAM:
CT ABDOMEN AND PELVIS WITH CONTRAST
TECHNIQUE: Multidetector CT imaging of the abdomen and pelvis was performed
using the standard protocol following bolus administration of
intravenous contrast.
CONTRAST:  100 cc Isovue 300 IV

[Series 2: a/p w/ 5mm · axial · 0.98mm/px · z∈[+745,+1195]mm · 14 of 101 slices shown, 16 images]
[im 6/101  soft-tissue]
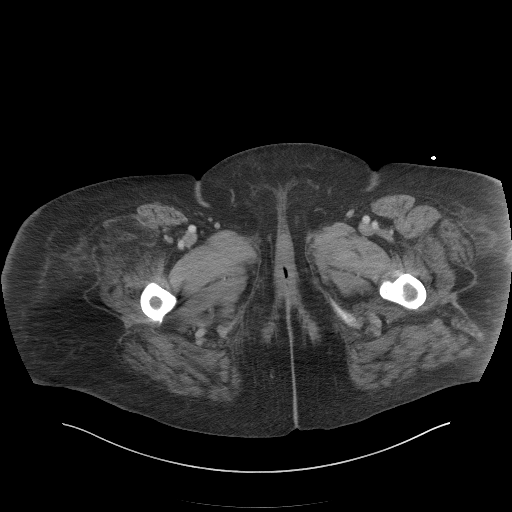
[im 6/101  bone]
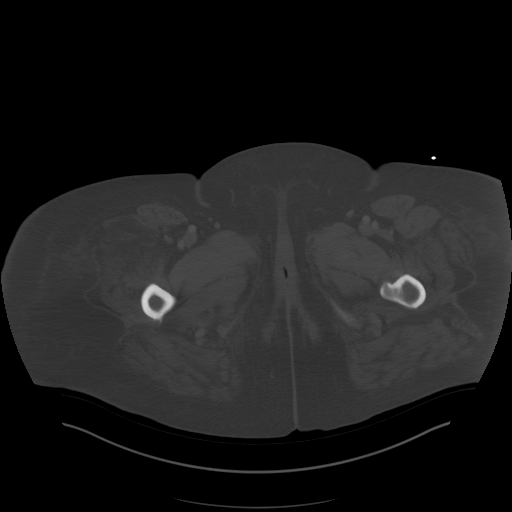
[im 16/101  soft-tissue]
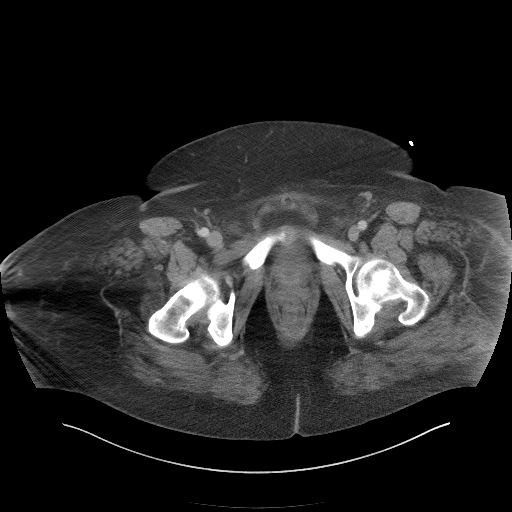
[im 21/101  soft-tissue]
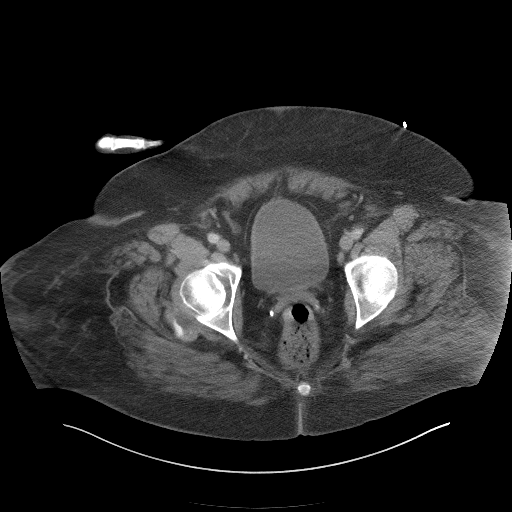
[im 26/101  soft-tissue]
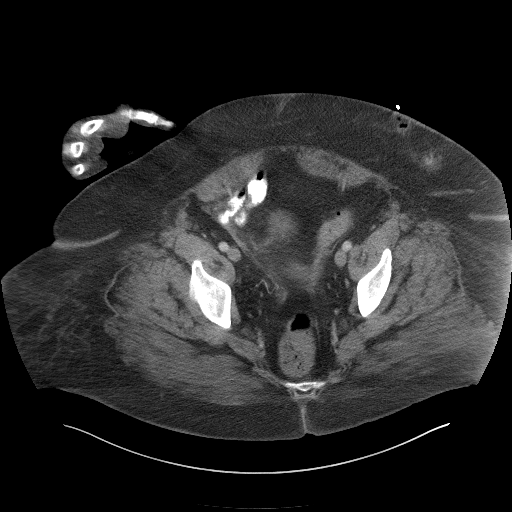
[im 36/101  soft-tissue]
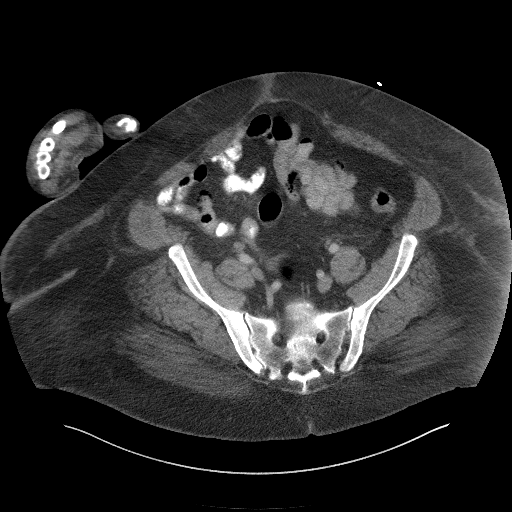
[im 41/101  soft-tissue]
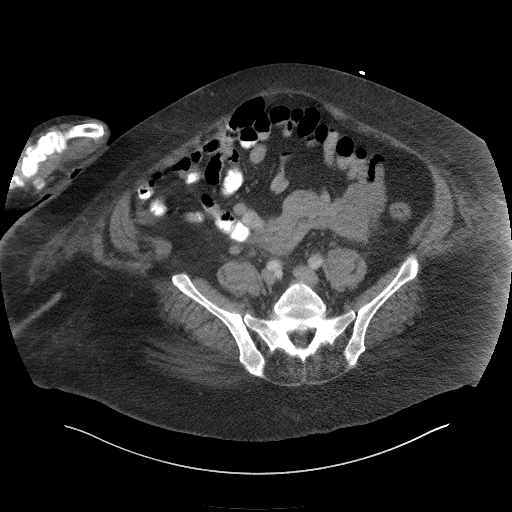
[im 46/101  soft-tissue]
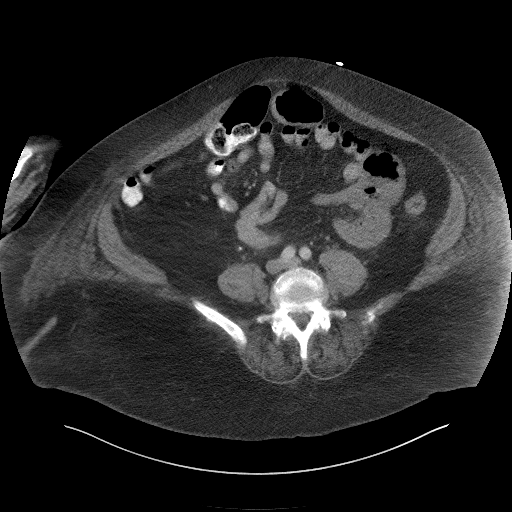
[im 56/101  soft-tissue]
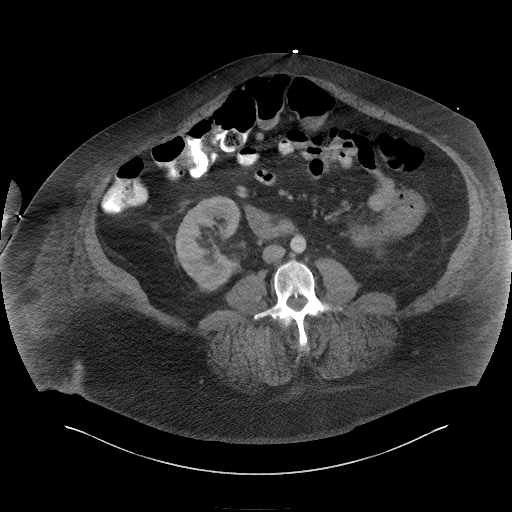
[im 61/101  soft-tissue]
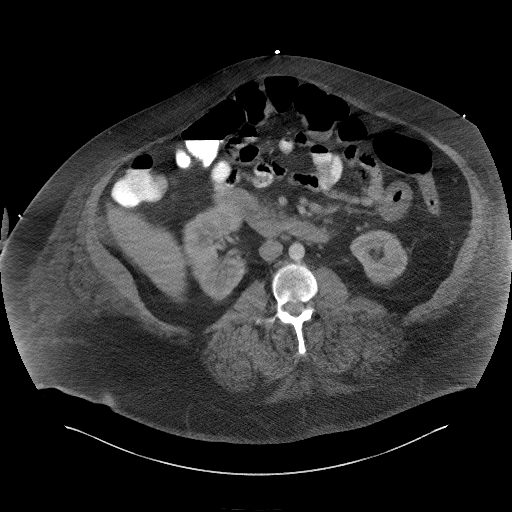
[im 61/101  bone]
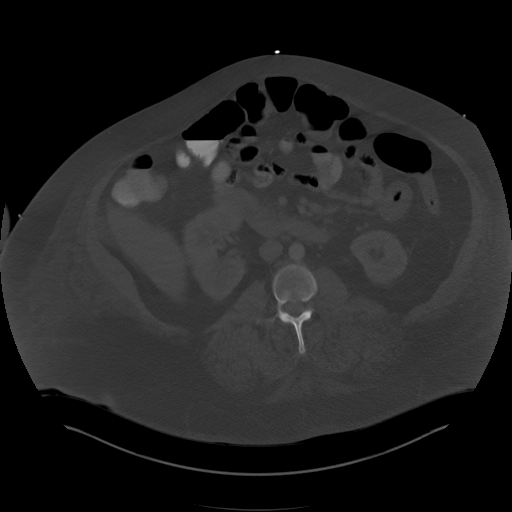
[im 66/101  soft-tissue]
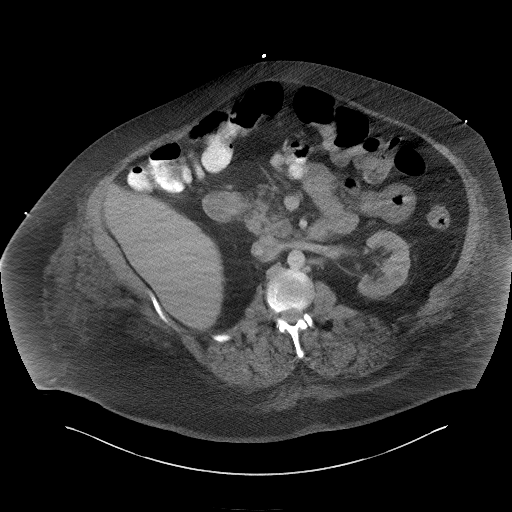
[im 76/101  soft-tissue]
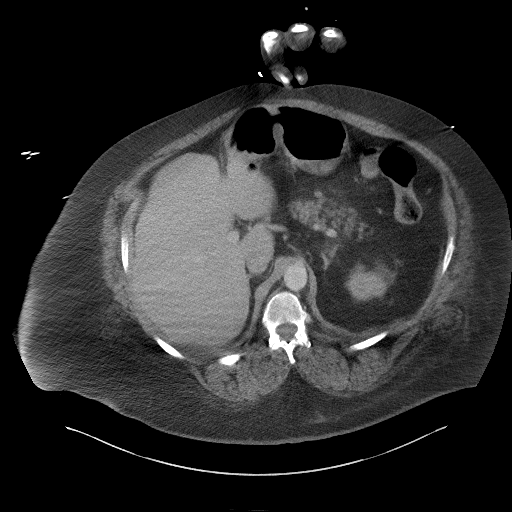
[im 81/101  soft-tissue]
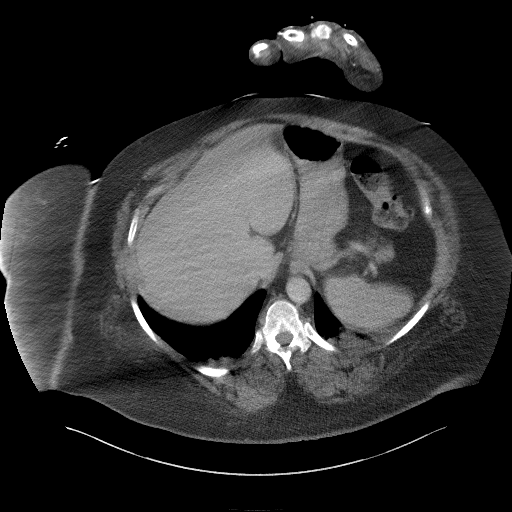
[im 86/101  soft-tissue]
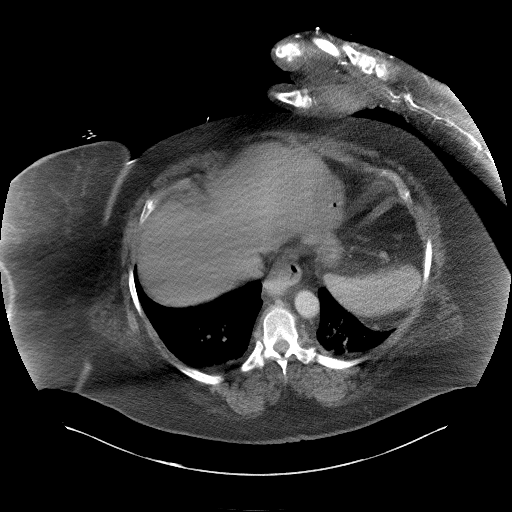
[im 96/101  soft-tissue]
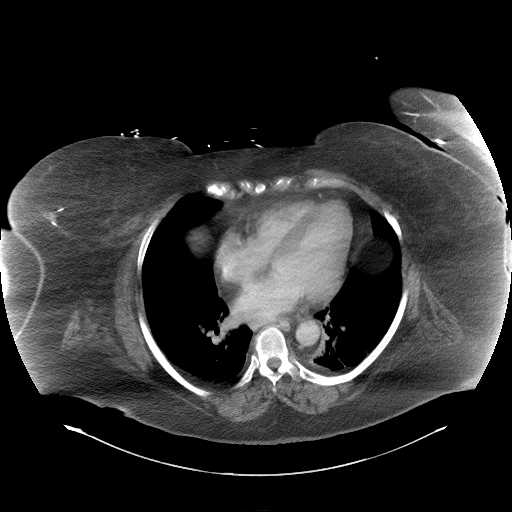

[Series 5: a/p w/ cor · coronal · 0.98mm/px · 3 of 199 slices shown]
[im 67/199  soft-tissue]
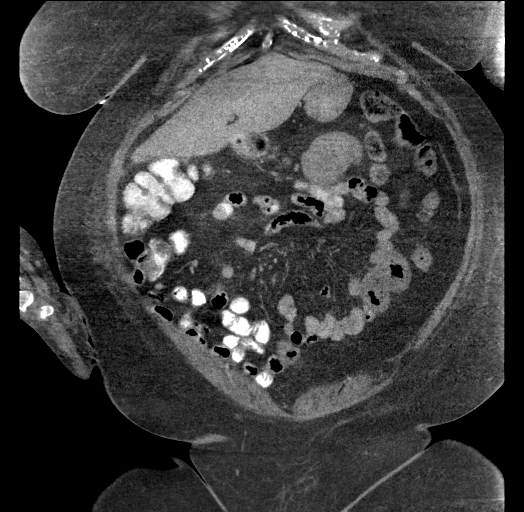
[im 89/199  soft-tissue]
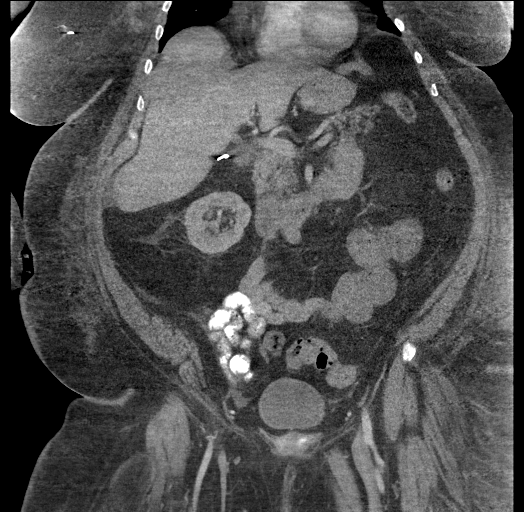
[im 111/199  soft-tissue]
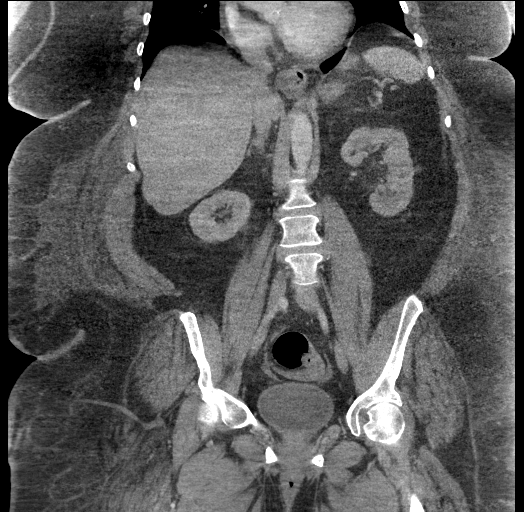

[17 of 46 positions shown; findings below may reference images not displayed]

FINDINGS: Lower chest: Small pleural effusions bilaterally slightly increased
since prior exam with adjacent atelectasis. Borderline cardiomegaly.
No pericardial effusion.

Hepatobiliary: Cholecystectomy. No focal liver lesion or biliary
dilatation.

Pancreas: Fatty atrophy of the pancreas without focal mass or ductal
dilatation. No surrounding peripancreatic inflammation.

Spleen: Normal size without focal abnormality.

Adrenals/Urinary Tract: Normal bilateral adrenal glands and kidneys
without obstructive uropathy. Physiologically distended appearance
of the bladder.

Stomach/Bowel: Contracted stomach. Normal small bowel rotation. Mild
jejunal thickening without significant fluid-filled distention.
Enteric contrast passes into large bowel. No evidence acute
appendicitis.

Vascular/Lymphatic: Aortic atherosclerosis. No enlarged abdominal or
pelvic lymph nodes.

Reproductive: Status post hysterectomy. No adnexal masses.

Other: Small fat containing umbilical hernia. No free air nor free
fluid. Left lower quadrant iatrogenic subcutaneous emphysema likely
related injection subcutaneous medication.

Musculoskeletal: Degenerate changes of the dorsal spine. No acute
nor suspicious osseous lesions.
IMPRESSION: 1. Moderate diffuse thickening of jejunum since prior exam
suspicious for enteritis. No mechanical bowel obstruction or
pneumatosis.
2. Slight increase in small bilateral pleural effusions. Bibasilar
atelectasis without pneumonic consolidations.
3. Small fat containing umbilical hernia.

## 2017-10-14 ENCOUNTER — Other Ambulatory Visit: Payer: Self-pay | Admitting: Family Medicine

## 2017-10-14 DIAGNOSIS — M546 Pain in thoracic spine: Secondary | ICD-10-CM

## 2017-10-14 DIAGNOSIS — M545 Low back pain, unspecified: Secondary | ICD-10-CM

## 2017-10-14 DIAGNOSIS — M51379 Other intervertebral disc degeneration, lumbosacral region without mention of lumbar back pain or lower extremity pain: Secondary | ICD-10-CM

## 2017-10-14 DIAGNOSIS — IMO0001 Reserved for inherently not codable concepts without codable children: Secondary | ICD-10-CM

## 2017-10-14 DIAGNOSIS — M5137 Other intervertebral disc degeneration, lumbosacral region: Secondary | ICD-10-CM

## 2017-10-14 DIAGNOSIS — R209 Unspecified disturbances of skin sensation: Secondary | ICD-10-CM

## 2017-10-14 DIAGNOSIS — M5440 Lumbago with sciatica, unspecified side: Principal | ICD-10-CM

## 2017-10-19 IMAGING — CT CT HEAD CODE STROKE
3 of 4 series · 17 of 47 positions shown, 20 images · non-contrast
Comparison: CT head without contrast 04/01/2016

CLINICAL DATA: Code stroke. Acute onset of slurred speech and
weakness.

EXAM:
CT HEAD WITHOUT CONTRAST
TECHNIQUE: Contiguous axial images were obtained from the base of the skull
through the vertex without intravenous contrast.

[Series 201: head w/o, idose (1) · axial · non-contrast · 0.41mm/px · z∈[+22,+152]mm · 11 of 32 slices shown, 14 images]
[im 3/32  brain]
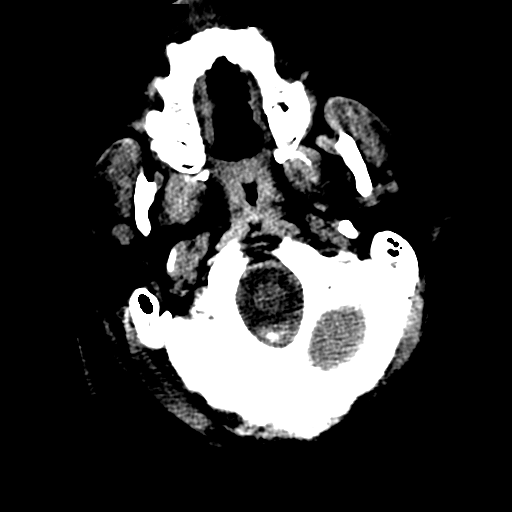
[im 3/32  bone]
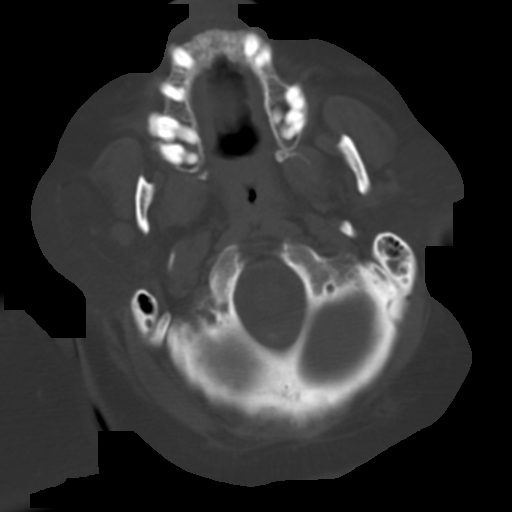
[im 5/32  brain]
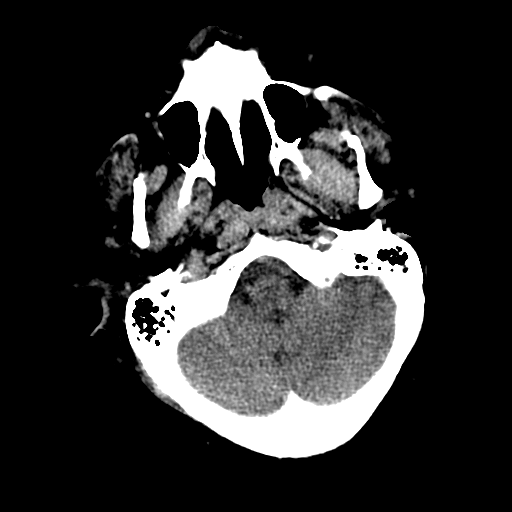
[im 7/32  brain]
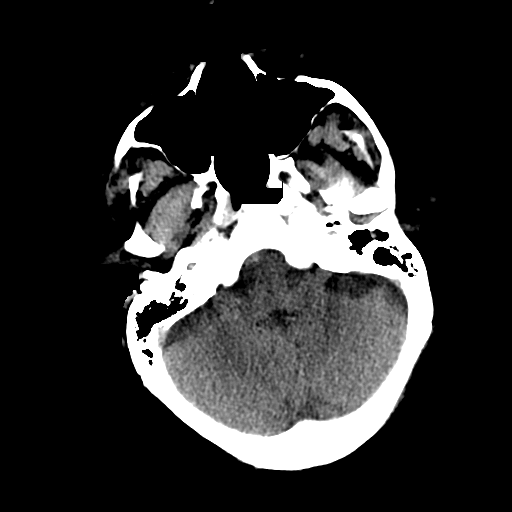
[im 12/32  brain]
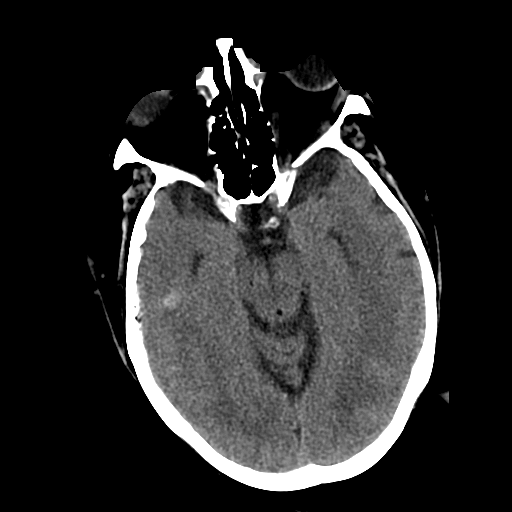
[im 14/32  brain]
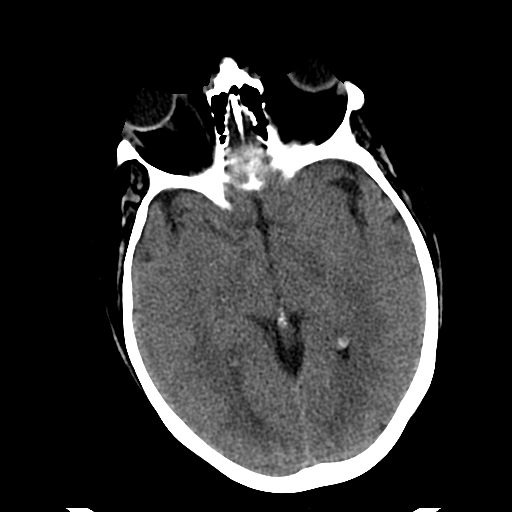
[im 14/32  bone]
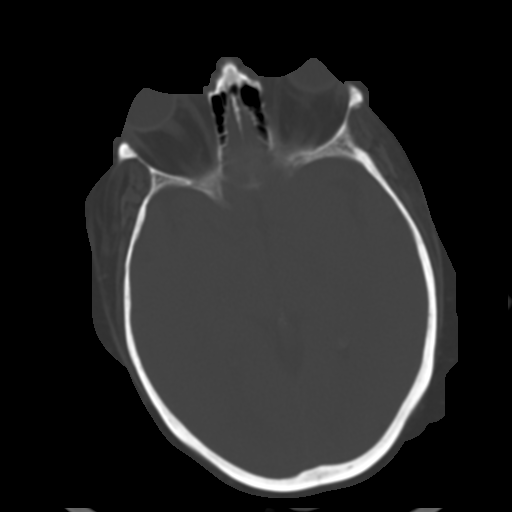
[im 16/32  brain]
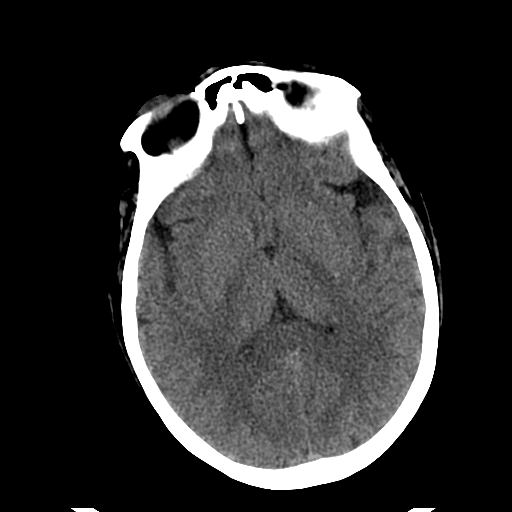
[im 18/32  brain]
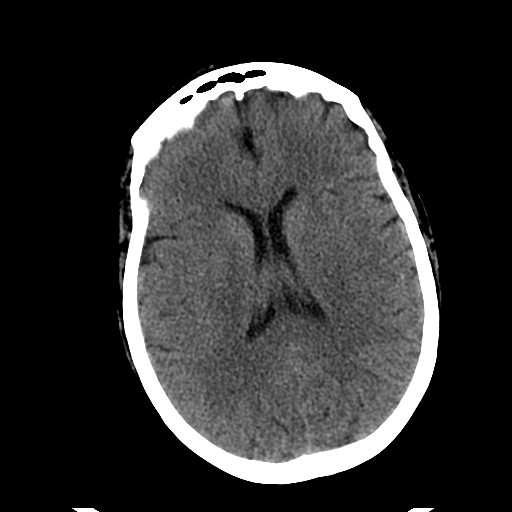
[im 20/32  brain]
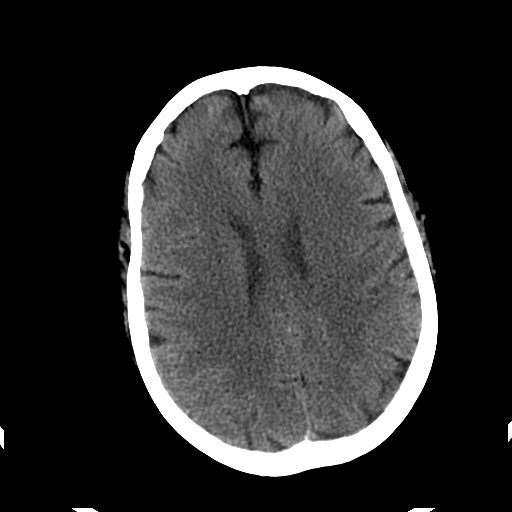
[im 25/32  brain]
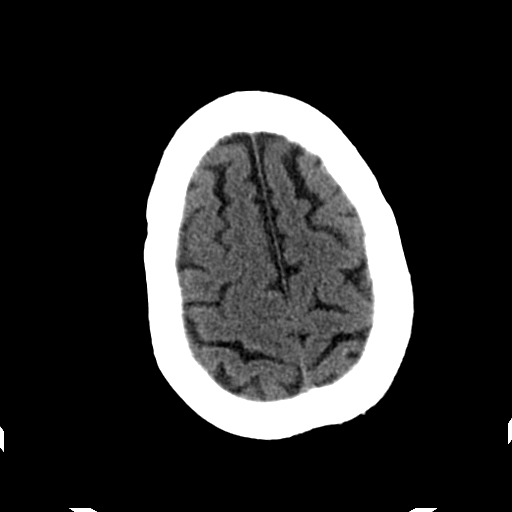
[im 25/32  bone]
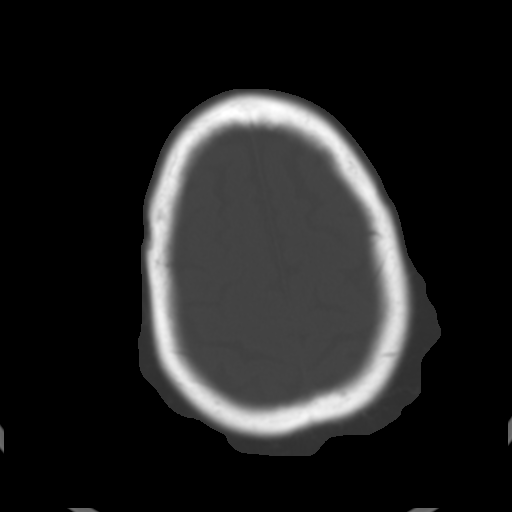
[im 27/32  brain]
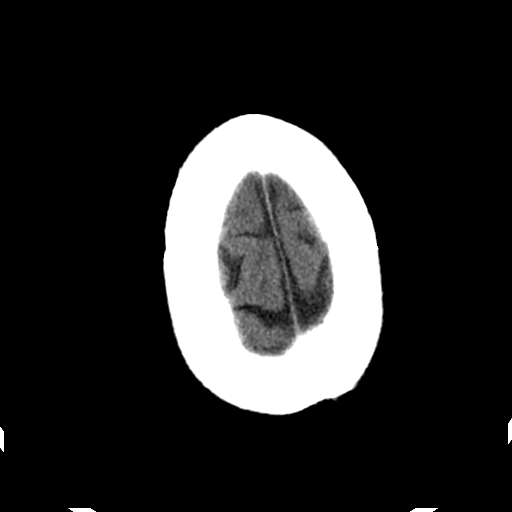
[im 29/32  brain]
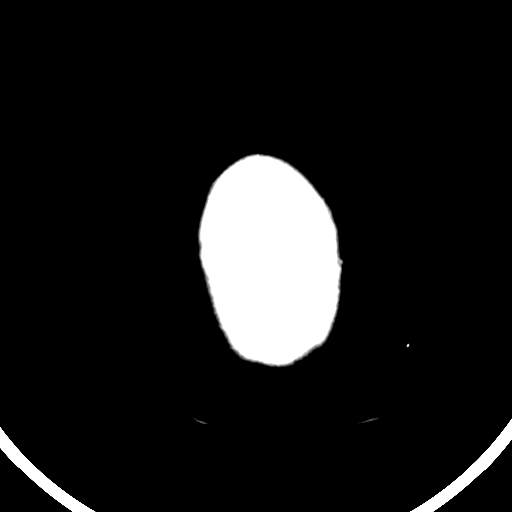

[Series 203: coronal st, idose (1) · coronal · 0.40mm/px · 3 of 70 slices shown]
[im 24/70  brain]
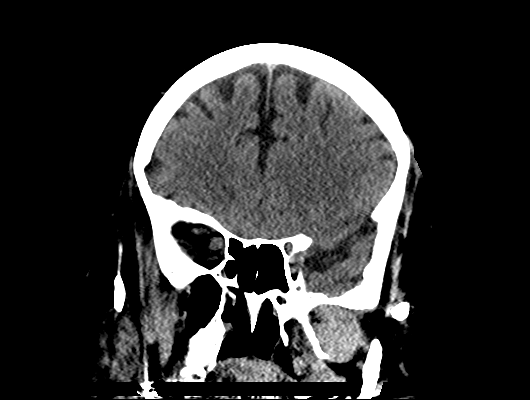
[im 31/70  brain]
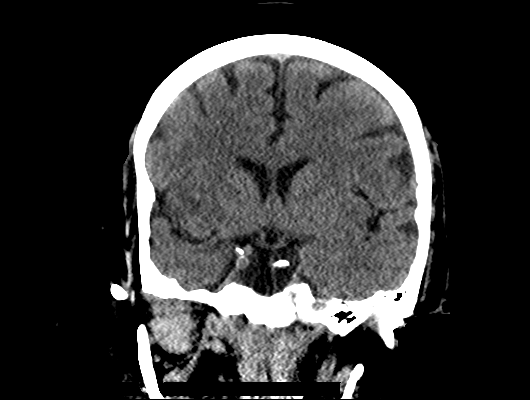
[im 39/70  brain]
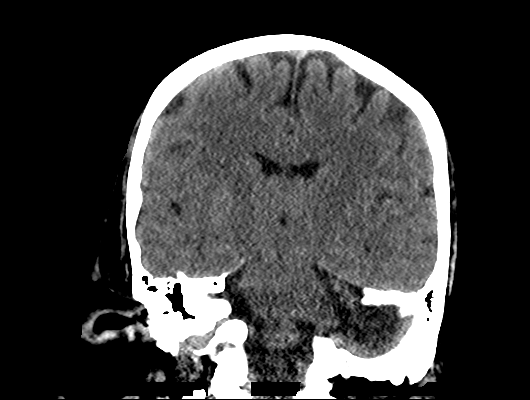

[Series 204: sagittal st, idose (1) · sagittal · 0.40mm/px · 3 of 70 slices shown]
[im 24/70  brain]
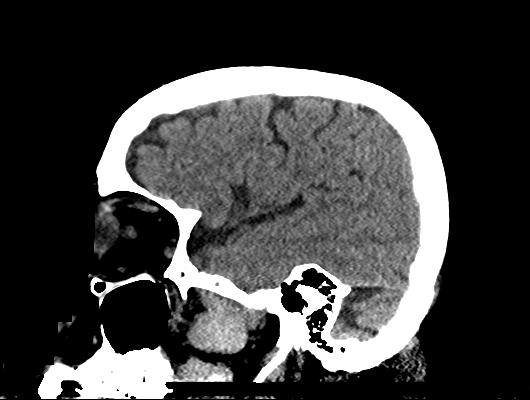
[im 35/70  brain]
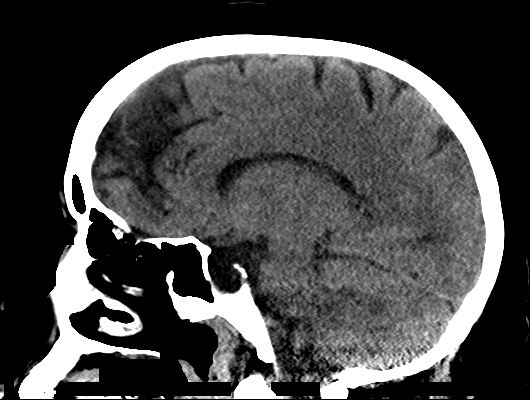
[im 47/70  brain]
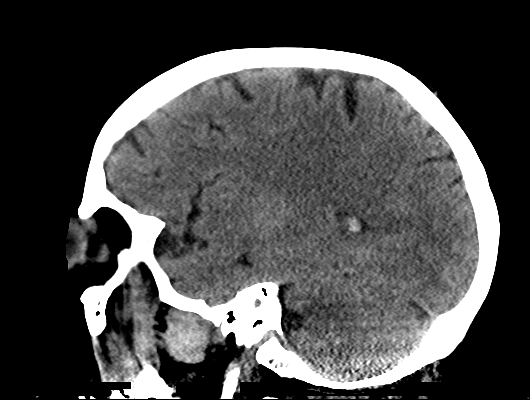

[17 of 47 positions shown; findings below may reference images not displayed]

FINDINGS: Brain: No acute infarct, hemorrhage, or mass lesion is present. The
ventricles are of normal size. No significant extraaxial fluid
collection is present. No significant white matter disease is
present. The brainstem and cerebellum are within normal limits.

Vascular: No hyperdense vessel or unexpected calcification.

Skull: The calvarium is within normal limits. No focal lytic or
blastic lesions are present.

Sinuses/Orbits: The paranasal sinuses are clear. Debris in the
external auditory canals is again noted. Go

ASPECTS ([place] Stroke Program Early CT Score)

- Ganglionic level infarction (caudate, lentiform nuclei, internal
capsule, insula, M1-M3 cortex): [DATE]

- Supraganglionic infarction (M4-M6 cortex): [DATE]

Total score (0-10 with 10 being normal): [DATE]
IMPRESSION: 1. Normal CT appearance of the brain. No acute or focal lesion to
explain abnormal speech or weakness.
2. ASPECTS is [DATE]

## 2017-10-22 ENCOUNTER — Ambulatory Visit
Admission: RE | Admit: 2017-10-22 | Discharge: 2017-10-22 | Disposition: A | Discharge status: Home / Self Care | Payer: Medicare Other | Source: Ambulatory Visit | Attending: Family Medicine | Admitting: Family Medicine

## 2017-10-22 DIAGNOSIS — M5440 Lumbago with sciatica, unspecified side: Principal | ICD-10-CM

## 2017-10-22 DIAGNOSIS — M5137 Other intervertebral disc degeneration, lumbosacral region: Secondary | ICD-10-CM

## 2017-10-22 DIAGNOSIS — R209 Unspecified disturbances of skin sensation: Secondary | ICD-10-CM

## 2017-10-22 DIAGNOSIS — M546 Pain in thoracic spine: Secondary | ICD-10-CM

## 2017-10-22 DIAGNOSIS — IMO0001 Reserved for inherently not codable concepts without codable children: Secondary | ICD-10-CM

## 2017-10-22 DIAGNOSIS — M545 Low back pain, unspecified: Secondary | ICD-10-CM

## 2017-10-23 ENCOUNTER — Other Ambulatory Visit: Payer: Self-pay

## 2017-11-02 ENCOUNTER — Other Ambulatory Visit: Payer: Self-pay | Admitting: Endocrinology

## 2017-11-03 ENCOUNTER — Other Ambulatory Visit: Payer: Self-pay | Admitting: Endocrinology

## 2017-11-06 ENCOUNTER — Emergency Department (HOSPITAL_COMMUNITY): Payer: Medicare Other

## 2017-11-06 ENCOUNTER — Emergency Department (HOSPITAL_COMMUNITY)
Admission: EM | Admit: 2017-11-06 | Discharge: 2017-11-06 | Disposition: A | Discharge status: Home / Self Care | Payer: Medicare Other | Attending: Emergency Medicine | Admitting: Emergency Medicine

## 2017-11-06 ENCOUNTER — Encounter (HOSPITAL_COMMUNITY): Payer: Self-pay | Admitting: Emergency Medicine

## 2017-11-06 DIAGNOSIS — M545 Low back pain, unspecified: Secondary | ICD-10-CM

## 2017-11-06 DIAGNOSIS — E1122 Type 2 diabetes mellitus with diabetic chronic kidney disease: Secondary | ICD-10-CM | POA: Insufficient documentation

## 2017-11-06 DIAGNOSIS — Y998 Other external cause status: Secondary | ICD-10-CM | POA: Insufficient documentation

## 2017-11-06 DIAGNOSIS — Y9389 Activity, other specified: Secondary | ICD-10-CM | POA: Insufficient documentation

## 2017-11-06 DIAGNOSIS — Z96653 Presence of artificial knee joint, bilateral: Secondary | ICD-10-CM | POA: Insufficient documentation

## 2017-11-06 DIAGNOSIS — S3210XA Unspecified fracture of sacrum, initial encounter for closed fracture: Secondary | ICD-10-CM | POA: Insufficient documentation

## 2017-11-06 DIAGNOSIS — F209 Schizophrenia, unspecified: Secondary | ICD-10-CM | POA: Diagnosis not present

## 2017-11-06 DIAGNOSIS — F329 Major depressive disorder, single episode, unspecified: Secondary | ICD-10-CM | POA: Insufficient documentation

## 2017-11-06 DIAGNOSIS — Z79899 Other long term (current) drug therapy: Secondary | ICD-10-CM | POA: Insufficient documentation

## 2017-11-06 DIAGNOSIS — I129 Hypertensive chronic kidney disease with stage 1 through stage 4 chronic kidney disease, or unspecified chronic kidney disease: Secondary | ICD-10-CM | POA: Diagnosis not present

## 2017-11-06 DIAGNOSIS — W01198A Fall on same level from slipping, tripping and stumbling with subsequent striking against other object, initial encounter: Secondary | ICD-10-CM | POA: Insufficient documentation

## 2017-11-06 DIAGNOSIS — Z7982 Long term (current) use of aspirin: Secondary | ICD-10-CM | POA: Insufficient documentation

## 2017-11-06 DIAGNOSIS — N183 Chronic kidney disease, stage 3 (moderate): Secondary | ICD-10-CM | POA: Diagnosis not present

## 2017-11-06 DIAGNOSIS — W19XXXA Unspecified fall, initial encounter: Secondary | ICD-10-CM

## 2017-11-06 DIAGNOSIS — J45909 Unspecified asthma, uncomplicated: Secondary | ICD-10-CM | POA: Diagnosis not present

## 2017-11-06 DIAGNOSIS — E039 Hypothyroidism, unspecified: Secondary | ICD-10-CM | POA: Diagnosis not present

## 2017-11-06 DIAGNOSIS — Y92009 Unspecified place in unspecified non-institutional (private) residence as the place of occurrence of the external cause: Secondary | ICD-10-CM

## 2017-11-06 DIAGNOSIS — Z9049 Acquired absence of other specified parts of digestive tract: Secondary | ICD-10-CM | POA: Insufficient documentation

## 2017-11-06 DIAGNOSIS — Y92002 Bathroom of unspecified non-institutional (private) residence single-family (private) house as the place of occurrence of the external cause: Secondary | ICD-10-CM | POA: Insufficient documentation

## 2017-11-06 DIAGNOSIS — G8929 Other chronic pain: Secondary | ICD-10-CM

## 2017-11-06 DIAGNOSIS — Z794 Long term (current) use of insulin: Secondary | ICD-10-CM | POA: Insufficient documentation

## 2017-11-06 DIAGNOSIS — S3992XA Unspecified injury of lower back, initial encounter: Secondary | ICD-10-CM | POA: Diagnosis present

## 2017-11-06 DIAGNOSIS — Z853 Personal history of malignant neoplasm of breast: Secondary | ICD-10-CM | POA: Diagnosis not present

## 2017-11-06 DIAGNOSIS — Z87891 Personal history of nicotine dependence: Secondary | ICD-10-CM | POA: Diagnosis not present

## 2017-11-06 DIAGNOSIS — F419 Anxiety disorder, unspecified: Secondary | ICD-10-CM | POA: Insufficient documentation

## 2017-11-06 LAB — CBG MONITORING, ED: Glucose-Capillary: 104 mg/dL — ABNORMAL HIGH (ref 70–99)

## 2017-11-06 MED ORDER — OXYCODONE HCL 5 MG PO TABS
10.0000 mg | ORAL_TABLET | Freq: Once | ORAL | Status: AC
  Administered 2017-11-06: 10 mg via ORAL
  Filled 2017-11-06: qty 2

## 2017-11-06 MED ORDER — METHOCARBAMOL 500 MG PO TABS
500.0000 mg | ORAL_TABLET | Freq: Once | ORAL | Status: AC
  Administered 2017-11-06: 500 mg via ORAL
  Filled 2017-11-06: qty 1

## 2017-11-06 NOTE — ED Triage Notes (Signed)
  Patient BIB [facility] with lower back pain.  Patient states she was getting up around 0300 to go shower and fell backwards landing on her buttocks.  Patient did not hit her head or have LOC.  Patient has hx of L1-L5 fx from [facility], corrective surgery October 2018.  Patient is A&O x 4 and 10/10 pain.  Patient also states she hurts around her navel.  Has been taking antibiotics for infection around navel.

## 2017-11-06 NOTE — Progress Notes (Signed)
Returned patient to room 35 in the ER. Attached purwick to suction canister.  The board on the wall fell, hitting the pillow behind the patients head. I ask if the patient was ok.  She stated that it scared her and she felt the pillow into her head but she is ok  I placed the board behind the stretcher.  I went to tell the RN, Percell Boston who was with RN [employer].  They both returned to room with me to check on patient. The patient stated to them what she stated to me.  Claiborne Billings told me to leave the board where it was and left the room.  The patient then stated her neck was hurting but it was hurting before from her fall.
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
## 2017-11-06 NOTE — ED Notes (Signed)
[facility] arrived to transport patient home. D/c reviewed with patient. Encouraged to follow up as ordered.

## 2017-11-06 NOTE — ED Notes (Signed)
[facility] Called @ 0920-per [place], RN-called by [facility]
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
## 2017-11-11 ENCOUNTER — Ambulatory Visit (INDEPENDENT_AMBULATORY_CARE_PROVIDER_SITE_OTHER): Payer: Medicare Other | Admitting: Orthopaedic Surgery

## 2017-11-11 ENCOUNTER — Other Ambulatory Visit: Payer: Self-pay | Admitting: Endocrinology

## 2017-11-18 ENCOUNTER — Encounter (INDEPENDENT_AMBULATORY_CARE_PROVIDER_SITE_OTHER): Payer: Self-pay | Admitting: Orthopaedic Surgery

## 2017-11-18 ENCOUNTER — Ambulatory Visit (INDEPENDENT_AMBULATORY_CARE_PROVIDER_SITE_OTHER): Payer: Medicare Other | Admitting: Orthopaedic Surgery

## 2017-11-18 VITALS — BP 178/90 | HR 109 | Ht 65.0 in | Wt 345.0 lb

## 2017-11-18 DIAGNOSIS — M545 Low back pain: Secondary | ICD-10-CM

## 2017-11-18 DIAGNOSIS — W19XXXD Unspecified fall, subsequent encounter: Secondary | ICD-10-CM

## 2017-11-18 NOTE — Progress Notes (Signed)
Office Visit Note   Patient: [name]           Date of Birth: Dec 27, 1955           MRN: [id number] Visit Date: 11/18/2017              Requested by: Levin Bacon, NP [place] [address] [place], [place] [address] PCP: Levin Bacon, NP   Assessment & Plan: Visit Diagnoses:  1. Fall, subsequent encounter      While in shower with CT scan showing chip anterior motion at S1.  Vertebral bodies were intact.  No evidence of pelvic fracture coccyx was intact.  Plan: We discussed fall prevention.  Aggravating risk factors for falling including her secondary Parkinson's, narcotic pain medication, lumbar disc degeneration with some lateral recess stenosis, and her weight.  She will try to work on getting to a pool when she is feeling better and work on some weight loss.  Follow-up here will be on a as needed basis.  Follow-Up Instructions: No follow-ups on file.   Orders:  No orders of the defined types were placed in this encounter.  No orders of the defined types were placed in this encounter.     Procedures: No procedures performed   Clinical Data: No additional findings.   Subjective: Chief Complaint  Patient presents with  . Lower Back - Pain  . Pelvis - Pain    HPI 62 year old female returns are not seen her since June.  She had a episode where she fell in the shower on 11/06/2017 was seen in the emergency room x-rays CT scan was obtained which showed chip avulsion off of S1 anteriorly.  She is increased her oxycodone from 10 to 15 mg every 6 and still having pain problems.  She has problems with obesity and has some stenosis in the lumbar spine.  She is had injections in the past.  Previously we have discussed some pool therapy recommendations to help with weight loss which she is looking into but has not started.  Review of Systems 14 point review of systems updated of note history of stage III kidney disease diabetic neuropathy, obesity, Parkinson's, depression  hypertension, chronic narcotic pain management usage otherwise negative as it pertains HPI.   Objective: Vital Signs: BP (!) 178/90   Pulse (!) 109   Ht 5' 5" (1.651 m)   Wt (!) 345 lb (156.5 kg)   BMI 57.41 kg/m   Physical Exam  Constitutional: She is oriented to person, place, and time. She appears well-developed.  HENT:  Head: Normocephalic.  Right Ear: External ear normal.  Left Ear: External ear normal.  Eyes: Pupils are equal, round, and reactive to light.  Neck: No tracheal deviation present. No thyromegaly present.  Cardiovascular: Normal rate.  Pulmonary/Chest: Effort normal.  Abdominal: Soft.  Neurological: She is alert and oriented to person, place, and time.  Skin: Skin is warm and dry.  Psychiatric: She has a normal mood and affect. Her behavior is normal.    Ortho Exam well-healed total knee arthroplasties right and left.  She is using a rolling walker with reverse seat.  She has some pain with sitting pain with standing.  Pain that radiates in the right anterior thigh region.  Specialty Comments:  No specialty comments available.  Imaging: No results found.   PMFS History: Patient Active Problem List   Diagnosis Date Noted  . Spinal stenosis 06/09/2017  . HNP (herniated nucleus pulposus), lumbar 12/05/2016  . Lumbar  disc herniation with radiculopathy 10/15/2016  . Hypocalcemia 08/22/2016  . Gait abnormality 05/29/2016  . Encephalopathy acute 04/23/2016  . Hyponatremia 04/23/2016  . Schizophrenia, paranoid ([place])   . Aspiration pneumonia of left lower lobe due to gastric secretions ([place])   . Projectile vomiting with nausea   . Altered mental status   . Schizophrenia ([place])   . Disorganized schizophrenia ([place])   . Chronic pain syndrome   . Acute renal failure ([place])   . Controlled diabetes mellitus type 2 with complications ([place])   . Acute encephalopathy   . Oropharyngeal dysphagia   . Aspiration pneumonia of right lower lobe due to vomit ([place])   .  Acute renal failure (ARF) ([place]) 03/28/2016  . Encephalopathy 03/27/2016  . HCAP (healthcare-associated pneumonia) 02/29/2016  . Pneumonia 02/14/2016  . CAP (community acquired pneumonia) 02/13/2016  . Edema 02/13/2016  . AKI (acute kidney injury) ([place]) 02/13/2016  . Elevated troponin 06/24/2015  . Diarrhea 06/23/2015  . Sepsis ([place]) 06/23/2015  . Morbid (severe) obesity due to excess calories ([place]) 03/26/2015  . Post traumatic stress disorder (PTSD) 11/14/2014  . Leukocytosis 11/12/2014  . Slurred speech 11/12/2014  . CKD (chronic kidney disease) stage 3, GFR 30-59 ml/min (HCC)   . Diabetes mellitus, type II, insulin dependent ([place])   . TIA (transient ischemic attack)   . Hypotension 10/28/2014  . Syncope 10/28/2014  . Anemia 10/28/2014  . Type II diabetes mellitus ([place]) 10/28/2014  . OSA on CPAP 10/28/2014  . Chronic lower back pain 10/28/2014  . Polyneuropathy in diabetes(357.2) 10/28/2014  . Diabetic polyneuropathy ([place])   . Fall 08/14/2014  . Recurrent falls 08/14/2014  . Hot flashes 02/24/2014  . Arthralgia 02/24/2014  . Hair loss 02/24/2014  . Chest pain 01/06/2014  . Shock ([place]) 08/08/2013  . Secondary parkinsonism ([place]) 12/30/2012  . Dysarthria 12/30/2012  . Benign paroxysmal positional vertigo 12/30/2012  . Breast cancer of upper-outer quadrant of right female breast ([place]) 11/18/2012  . Hemorrhage of rectum and anus 09/28/2012  . Hx of radiation therapy   . Syncope and collapse 06/04/2011  . HSV 10/05/2008  . Mild persistent asthma 06/02/2008  . HLD (hyperlipidemia) 06/02/2006  . Sinus tachycardia 05/25/2006  . Hypothyroidism 02/27/2006  . Depression 02/27/2006  . Essential hypertension 02/27/2006  . GERD 02/27/2006  . KNEE PAIN 02/27/2006   Past Medical History:  Diagnosis Date  . Anemia   . Anxiety   . Arthritis    "pretty much all my joints"  . Asthma   . Benign paroxysmal positional vertigo 12/30/2012  . Breast cancer ([place]) 02/13/12   ruq   100'clock bx Ductal Carcinoma in Situ,(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  . Breast mass in female    L breast 2008, US showed likely fat necrosis  . Chronic lower back pain   . CKD (chronic kidney disease), stage III ([place])    "lower stage" (01/06/2014)  . Daily headache    last time was September 02, 2016 (brother passed away)  . Depression   . Dyspnea   . Gait abnormality 05/29/2016  . GERD (gastroesophageal reflux disease)   . History of blood transfusion 2011   "after one of my OR's"  . History of hiatal hernia   . History of stomach ulcers   . HSV (herpes simplex virus) infection   . Hx of radiation therapy 05/05/12- 07/15/12   right breast, 45 gray x 25 fx, lumpectomy cavity boosted to 16.2 gray  . Hyperlipemia   . Hypertension    sees  Dr. Criss Rosales , [facility]  . Hypothyroidism   . Kidney stones   . Knee pain, bilateral   . Migraines    "~ 3 times/month" (01/06/2014)  . Obesity   . OSA on CPAP    pt does not know settings  . Pancreatitis    hx of  . Personal history of radiation therapy 02/2012  . Pneumonia 1950's  . Polyneuropathy in diabetes(357.2)   . Schizophrenia ([place])   . Secondary parkinsonism ([place]) 12/30/2012  . Tachycardia    with sx in 2008  . Thyroid disease   . Type II diabetes mellitus ([place])    dx 2014    Family History  Problem Relation Age of Onset  . Heart disease Brother        Multiple MIs, starting in his 16s  . Diabetes Brother   . Thyroid disease Brother   . Hypertension Mother   . Diabetes Mother   . Breast cancer Mother 39  . Bone cancer Mother   . Hypertension Sister   . Diabetes Sister   . Breast cancer Sister 68  . Thyroid disease Sister   . Breast cancer Maternal Grandmother   . Heart disease Maternal Grandmother   . Uterine cancer Other 19  . Breast cancer Paternal Aunt 14  . Breast cancer Paternal Grandmother        dx in her 72s  . Prostate cancer Paternal Grandfather   . Asthma Son     Past Surgical History:  Procedure Laterality Date  .  ABDOMINAL HYSTERECTOMY  1979?   partial  . benign cyst  Left    removed from left breast  . BREAST BIOPSY Right 02/13/2012  . BREAST LUMPECTOMY  03/03/2012   Procedure: LUMPECTOMY;  Surgeon: Haywood Lasso, MD;  Location: [facility];  Service: General;  Laterality: Right;  . BREAST SURGERY Right 01/2012   "cancer"  . CHOLECYSTECTOMY  1980's?  . COLONOSCOPY WITH PROPOFOL N/A 09/28/2012   Procedure: COLONOSCOPY WITH PROPOFOL;  Surgeon: Lear Ng, MD;  Location: [facility];  Service: Endoscopy;  Laterality: N/A;  . FOOT FRACTURE SURGERY Right 1990's  . JOINT REPLACEMENT      x 3  . LUMBAR LAMINECTOMY/DECOMPRESSION MICRODISCECTOMY N/A 12/05/2016   Procedure: LEFT L5-S1 MICRODISCECTOMY;  Surgeon: Marybelle Killings, MD;  Location: [facility];  Service: Orthopedics;  Laterality: N/A;  . REVISION TOTAL KNEE ARTHROPLASTY  07/2009  . SHOULDER OPEN ROTATOR CUFF REPAIR Left 1990's  . THYROIDECTOMY  1970's  . TOTAL KNEE ARTHROPLASTY Bilateral 2009-06/2009   left; right   Social History   Occupational History  . Occupation: Disabled  Tobacco Use  . Smoking status: Former Smoker    Packs/day: 1.00    Years: 7.00    Pack years: 7.00    Types: Cigarettes    Last attempt to quit: 03/24/1992    Years since quitting: 25.6  . Smokeless tobacco: Never Used  Substance and Sexual Activity  . 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 Drug use: No  . Sexual activity: Never    Birth control/protection: Surgical

## 2017-12-07 IMAGING — RF DG SWALLOWING FUNCTION - NRPT [facility]
10 series · 24 of 24 positions shown · non-contrast
Comparison: none

[Series 1: cp_standard · 0.34mm/px · 3 of 64 frames shown (1 of 10)]
[frame 10/64]
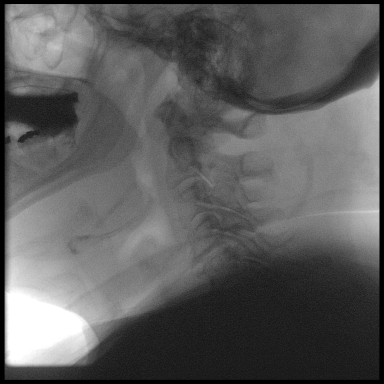
[frame 33/64]
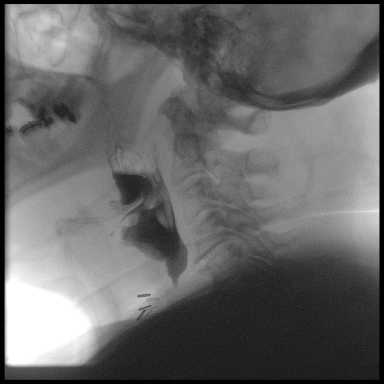
[frame 55/64]
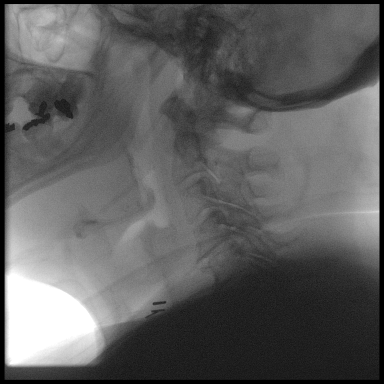

[Series 2: cp_standard · 0.34mm/px · 2 of 32 frames shown (2 of 10)]
[frame 17/32]
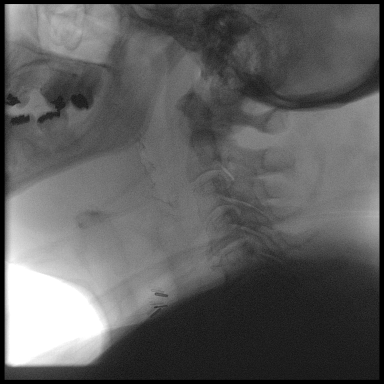
[frame 28/32]
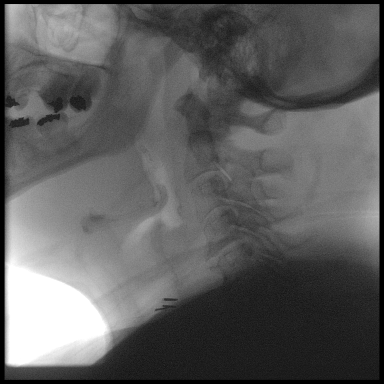

[Series 3: cp_standard · 0.51mm/px · 2 of 114 frames shown (3 of 10)]
[frame 15/114]
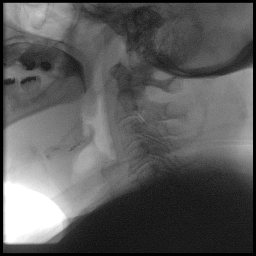
[frame 58/114]
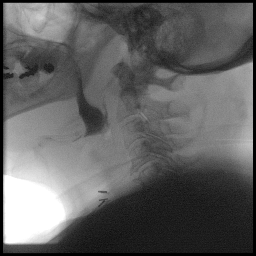

[Series 4: cp_standard · 0.51mm/px · 3 of 286 frames shown (4 of 10)]
[frame 6/286]
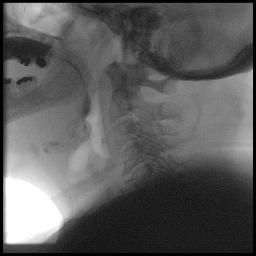
[frame 144/286]
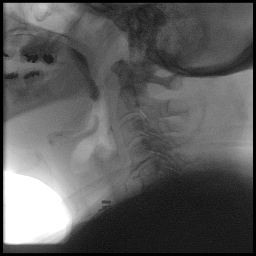
[frame 244/286]
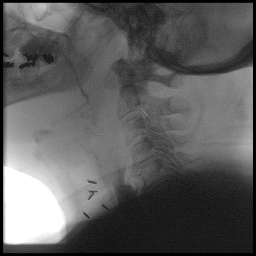

[Series 5: cp_standard · 0.51mm/px · 2 of 611 frames shown (5 of 10)]
[frame 306/611]
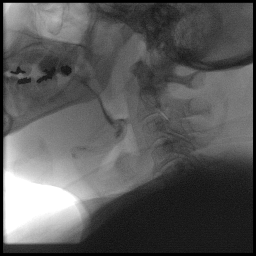
[frame 520/611]
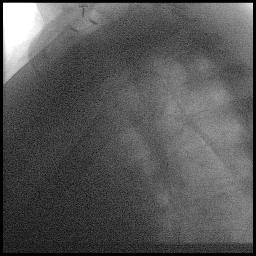

[Series 6: cp_standard · 0.51mm/px · 2 of 106 frames shown (6 of 10)]
[frame 16/106]
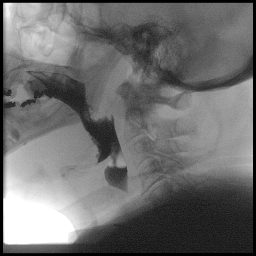
[frame 91/106]
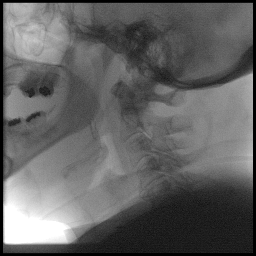

[Series 7: cp_standard · 0.51mm/px · 3 of 319 frames shown (7 of 10)]
[frame 48/319]
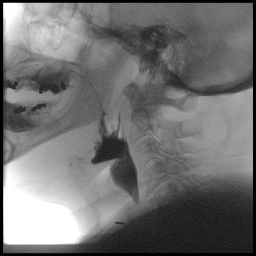
[frame 160/319]
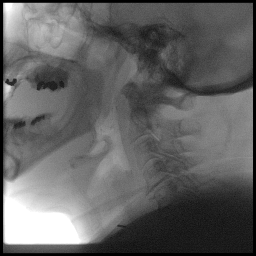
[frame 272/319]
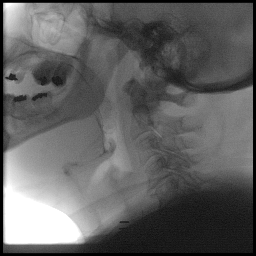

[Series 8: cp_standard · 0.51mm/px · 2 of 257 frames shown (8 of 10)]
[frame 55/257]
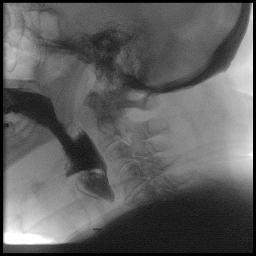
[frame 219/257]
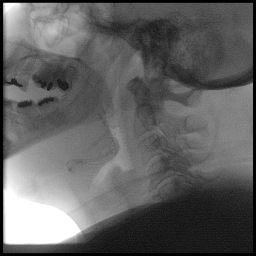

[Series 9: cp_standard · 0.51mm/px · 2 of 439 frames shown (9 of 10)]
[frame 66/439]
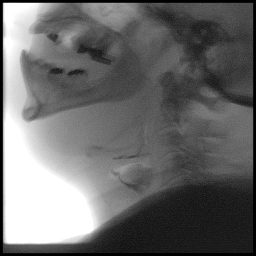
[frame 343/439]
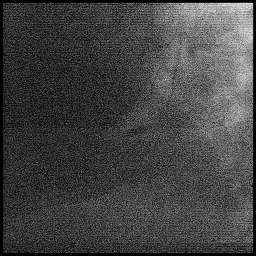

[Series 10: cp_standard · 0.51mm/px · 3 of 229 frames shown (10 of 10)]
[frame 33/229]
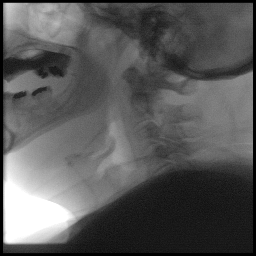
[frame 35/229]
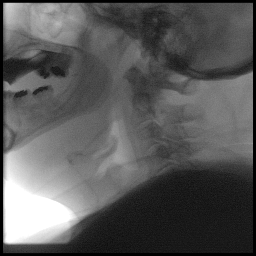
[frame 195/229]
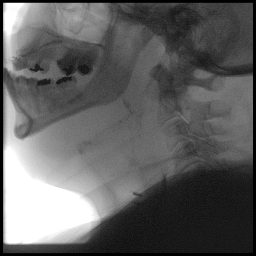

[24 of 24 positions shown; findings below may reference images not displayed]

FLUOROSCOPY FOR SWALLOWING FUNCTION STUDY:
Fluoroscopy was provided for swallowing function study, which was administered by a speech pathologist.  Final results and recommendations from this study are contained within the speech pathology report.

## 2017-12-15 ENCOUNTER — Encounter: Payer: Self-pay | Admitting: Endocrinology

## 2017-12-15 ENCOUNTER — Ambulatory Visit (INDEPENDENT_AMBULATORY_CARE_PROVIDER_SITE_OTHER): Payer: Medicare Other | Admitting: Endocrinology

## 2017-12-15 VITALS — BP 144/82 | HR 102 | Ht 65.0 in | Wt 334.8 lb

## 2017-12-15 DIAGNOSIS — Z23 Encounter for immunization: Secondary | ICD-10-CM

## 2017-12-15 DIAGNOSIS — E119 Type 2 diabetes mellitus without complications: Secondary | ICD-10-CM | POA: Diagnosis not present

## 2017-12-15 DIAGNOSIS — Z794 Long term (current) use of insulin: Secondary | ICD-10-CM

## 2017-12-15 DIAGNOSIS — E890 Postprocedural hypothyroidism: Secondary | ICD-10-CM

## 2017-12-15 LAB — TSH: TSH: 3.01 u[IU]/mL (ref 0.35–4.50)

## 2017-12-15 LAB — POCT GLYCOSYLATED HEMOGLOBIN (HGB A1C): HEMOGLOBIN A1C: 6.6 % — AB (ref 4.0–5.6)

## 2017-12-15 LAB — T4, FREE: Free T4: 1 ng/dL (ref 0.60–1.60)

## 2017-12-15 NOTE — Progress Notes (Signed)
Subjective:    Patient ID: [name], female    DOB: [birth year], 62 y.o.   MRN: [id number]  HPI Pt returns for f/u of postsurgical hypothyroidism (she had thyroidectomy in approx 1988, for a goiter; she says no cancer was found; she has been on prescribed thyroid hormone therapy since then; she has not recently had thyroid imaging; she does not know why TSH fluctuates, but med noncompliance has been noted; she takes synthroid as rx'ed; MRI (2018) showed normal orbits are normal, and no mention was made of the pituitary).  She takes synthroid as rx'ed Pt also returns for f/u of diabetes mellitus:  DM type: Insulin-requiring type 2.   Dx'ed: 3419 Complications: renal insuff and TIA Therapy: insulin since 2015, and janumet GDM: never DKA: never Severe hypoglycemia: never.   Pancreatitis: never Pancreatic imaging: Fatty atrophy was noted on 2018 CT.  Other: she takes multiple daily injections Interval history: she brings her meter with her cbg's which I have reviewed today.  it varies from 95-150. There is no trend throughout the day. Pt takes meds as rx'ed. No recent steroid shots.   Past Medical History:  Diagnosis Date  . Anemia   . Anxiety   . Arthritis    "pretty much all my joints"  . Asthma   . Benign paroxysmal positional vertigo 12/30/2012  . Breast cancer ([place]) 02/13/12   ruq  100'clock bx Ductal Carcinoma in Situ,(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  . Breast mass in female    L breast 2008, US showed likely fat necrosis  . Chronic lower back pain   . CKD (chronic kidney disease), stage III ([place])    "lower stage" (01/06/2014)  . Daily headache    last time was 09/01/16 (brother passed away)  . Depression   . Dyspnea   . Gait abnormality 05/29/2016  . GERD (gastroesophageal reflux disease)   . History of blood transfusion 2011   "after one of my OR's"  . History of hiatal hernia   . History of stomach ulcers   . HSV (herpes simplex virus) infection   . Hx of radiation therapy  05/05/12- 07/15/12   right breast, 45 gray x 25 fx, lumpectomy cavity boosted to 16.2 gray  . Hyperlipemia   . Hypertension    sees Dr. Criss Rosales , [facility]  . Hypothyroidism   . Kidney stones   . Knee pain, bilateral   . Migraines    "~ 3 times/month" (01/06/2014)  . Obesity   . OSA on CPAP    pt does not know settings  . Pancreatitis    hx of  . Personal history of radiation therapy 02/2012  . Pneumonia 1950's  . Polyneuropathy in diabetes(357.2)   . Schizophrenia ([place])   . Secondary parkinsonism ([place]) 12/30/2012  . Tachycardia    with sx in 2008  . Thyroid disease   . Type II diabetes mellitus ([place])    dx 2014    Past Surgical History:  Procedure Laterality Date  . ABDOMINAL HYSTERECTOMY  1979?   partial  . benign cyst  Left    removed from left breast  . BREAST BIOPSY Right 02/13/2012  . BREAST LUMPECTOMY  03/03/2012   Procedure: LUMPECTOMY;  Surgeon: Haywood Lasso, MD;  Location: [facility];  Service: General;  Laterality: Right;  . BREAST SURGERY Right 01/2012   "cancer"  . CHOLECYSTECTOMY  1980's?  . COLONOSCOPY WITH PROPOFOL N/A 09/28/2012   Procedure: COLONOSCOPY WITH PROPOFOL;  Surgeon: Loanne Drilling.  Michail Sermon, MD;  Location: [facility];  Service: Endoscopy;  Laterality: N/A;  . FOOT FRACTURE SURGERY Right 1990's  . JOINT REPLACEMENT      x 3  . LUMBAR LAMINECTOMY/DECOMPRESSION MICRODISCECTOMY N/A 12/05/2016   Procedure: LEFT L5-S1 MICRODISCECTOMY;  Surgeon: Marybelle Killings, MD;  Location: [facility];  Service: Orthopedics;  Laterality: N/A;  . REVISION TOTAL KNEE ARTHROPLASTY  07/2009  . SHOULDER OPEN ROTATOR CUFF REPAIR Left 1990's  . THYROIDECTOMY  1970's  . TOTAL KNEE ARTHROPLASTY Bilateral 2009-06/2009   left; right    Social History   Socioeconomic History  . Marital status: Divorced    Spouse name: Not on file  . Number of children: 2  . Years of education: 64  . Highest education level: Not on file  Occupational History  . Occupation: Disabled    Social Needs  . Financial resource strain: Not on file  . Food insecurity:    Worry: Not on file    Inability: Not on file  . Transportation needs:    Medical: Not on file    Non-medical: Not on file  Tobacco Use  . Smoking status: Former Smoker    Packs/day: 1.00    Years: 7.00    Pack years: 7.00    Types: Cigarettes    Last attempt to quit: 03/24/1992    Years since quitting: 25.7  . Smokeless tobacco: Never Used  Substance and Sexual Activity  . 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 Drug use: No  . Sexual activity: Never    Birth control/protection: Surgical  Lifestyle  . Physical activity:    Days per week: Not on file    Minutes per session: Not on file  . Stress: Not on file  Relationships  . Social connections:    Talks on phone: Not on file    Gets together: Not on file    Attends religious service: Not on file    Active member of club or organization: Not on file    Attends meetings of clubs or organizations: Not on file    Relationship status: Not on file  . Intimate partner violence:    Fear of current or ex partner: Not on file    Emotionally abused: Not on file    Physically abused: Not on file    Forced sexual activity: Not on file  Other Topics Concern  . Not on file  Social History Narrative   Single. Disabled Biochemist, clinical.          Patient is right handed.    Denies caffeine use     Current Outpatient Medications on File Prior to Visit  Medication Sig Dispense Refill  . albuterol (PROAIR HFA) 108 (90 Base) MCG/ACT inhaler Inhale 2 puffs into the lungs every 4 (four) hours as needed for wheezing or shortness of breath.     [name] aspirin EC 81 MG tablet Take 1 tablet (81 mg total) by mouth daily. 30 tablet 0  . B Complex-C (B-COMPLEX WITH VITAMIN C) tablet Take 1 tablet by mouth daily.    . budesonide-formoterol (SYMBICORT) 80-4.5 MCG/ACT inhaler Inhale 2 puffs into the lungs 2 (two) times daily. (Patient  taking differently: Inhale 1 puff into the lungs 3 (three) times daily. ) 1 Inhaler 11  . calcitRIOL (ROCALTROL) 0.25 MCG capsule TAKE ONE CAPSULE BY MOUTH THREE TIMES DAILY WEEKLY (Patient taking differently: Take 0.25 mcg by mouth 3 (three) times a  week. ) 13 capsule 2  . Cholecalciferol (VITAMIN D-3) 1000 units CAPS Take 1,000 Units by mouth daily.    . Cyanocobalamin (VITAMIN B-12 PO) Take 1 tablet by mouth daily.    . diphenhydrAMINE (BENADRYL) 25 MG tablet Take 50 mg by mouth every 6 (six) hours as needed for itching or allergies.    [name] doxepin (SINEQUAN) 25 MG capsule Take 50 mg by mouth at bedtime.    [name] esomeprazole (NEXIUM) 40 MG capsule Take 1 capsule (40 mg total) by mouth daily before breakfast. (Patient taking differently: Take 40 mg by mouth 2 (two) times daily before a meal. ) 30 capsule 1  . furosemide (LASIX) 40 MG tablet Take 1 tablet (40 mg total) by mouth daily. 30 tablet 0  . glucose blood (ONETOUCH VERIO) test strip Check blood sugar 3 times daily 100 each 12  . HUMALOG KWIKPEN 100 UNIT/ML KiwkPen INJECT 10 UNITS SUBCUTANEOUSLY THREE TIMES DAILY (Patient taking differently: Inject 10 Units into the skin 3 (three) times daily. ) 15 mL 4  . Insulin Detemir (LEVEMIR FLEXTOUCH) 100 UNIT/ML Pen Inject 10 Units into the skin at bedtime. 15 mL 4  . Iron-Vitamins (GERITOL PO) Take 1 tablet by mouth daily.    [name] JANUMET 50-500 MG tablet Take 1 tablet by mouth 2 (two) times daily with a meal. 60 tablet 1  . levothyroxine (SYNTHROID, LEVOTHROID) 150 MCG tablet TAKE ONE TABLET BY MOUTH EVERY MORNING BEFORE BREAKFAST (Patient taking differently: Take 150 mcg by mouth daily before breakfast. ) 90 tablet 0  . linaclotide (LINZESS) 290 MCG CAPS capsule Take 290 mcg by mouth daily before breakfast.    . LORazepam (ATIVAN) 2 MG tablet Take 1 mg by mouth 2 (two) times daily.     [name] losartan-hydrochlorothiazide (HYZAAR) 100-12.5 MG tablet Take 1 tablet by mouth daily.    [name] LYRICA 150 MG capsule Take  150 mg by mouth 3 (three) times daily.   1  . methocarbamol (ROBAXIN) 500 MG tablet Take 1 tablet (500 mg total) by mouth every 8 (eight) hours as needed for muscle spasms. 30 tablet 0  . Multiple Vitamin (MULTIVITAMIN WITH MINERALS) TABS tablet Take 1 tablet by mouth daily.    [name] oxyCODONE (ROXICODONE) 15 MG immediate release tablet     . Oxycodone HCl 10 MG TABS Take 10 mg by mouth 4 (four) times daily.   0  . rosuvastatin (CRESTOR) 20 MG tablet Take 20 mg by mouth daily.    [name] tiZANidine (ZANAFLEX) 4 MG tablet Take 4 mg by mouth 2 (two) times daily.     . valACYclovir (VALTREX) 1000 MG tablet Take 1,000 mg by mouth daily.    [name] venlafaxine XR (EFFEXOR-XR) 150 MG 24 hr capsule Take 150 mg by mouth 2 (two) times daily.    . ziprasidone (GEODON) 20 MG capsule Take 1 capsule (20 mg total) by mouth 2 (two) times daily with a meal. (Patient taking differently: Take 40 mg by mouth daily. )     No current facility-administered medications on file prior to visit.     Allergies  Allergen Reactions  . Bee Venom Anaphylaxis  . Shellfish Allergy Anaphylaxis  . Buprenorphine Hcl Other (See Comments)    Overly sedated  . Other Other (See Comments)    Cats - bad asthma, stopped up    Family History  Problem Relation Age of Onset  . Heart disease Brother        Multiple MIs, starting in his 1s  .  Diabetes Brother   . Thyroid disease Brother   . Hypertension Mother   . Diabetes Mother   . Breast cancer Mother 68  . Bone cancer Mother   . Hypertension Sister   . Diabetes Sister   . Breast cancer Sister 67  . Thyroid disease Sister   . Breast cancer Maternal Grandmother   . Heart disease Maternal Grandmother   . Uterine cancer Other 19  . Breast cancer Paternal Aunt 71  . Breast cancer Paternal Grandmother        dx in her 20s  . Prostate cancer Paternal Grandfather   . Asthma Son     BP (!) 144/82 (BP Location: Left Arm, Patient Position: Sitting, Cuff Size: Large)   Pulse (!) 102    Ht _0  (1.651 m)   Wt (!) 334 lb 12.8 oz (151.9 kg)   SpO2 91%   BMI 55.71 kg/m    Review of Systems She denies hypoglycemia    Objective:   Physical Exam VITAL SIGNS:  See vs page GENERAL: no distress Pulses: foot pulses are intact bilaterally.   MSK: no deformity of the feet or ankles.  CV: trace bilat edema of the legs.  Skin:  no ulcer on the feet or ankles.  normal color and temp on the feet and ankles Neuro: sensation is intact to touch on the feet and ankles.    A1c=6.6%      Assessment & Plan:  HTN: is noted today Insulin-requiring type 2 DM, with polyneuropathy: well-controlled Renal insuff: in this setting, she needs mostly mealtime insulin. Hypothyroidism: recheck today  Patient Instructions  Your blood pressure is high today.  Please see your primary care provider soon, to have it rechecked.    Please continue the same insulins for now.  blood tests are requested for you today.  We'll let you know about the results.   check your blood sugar 3 times a day.  vary the time of day when you check, between before the 3 meals, and at bedtime.  also check if you have symptoms of your blood sugar being too high or too low.  please keep a record of the readings and bring it to your next appointment here (or you can bring the meter itself).  You can write it on any piece of paper.  please call us sooner if your blood sugar goes below 70, or if you have a lot of readings over 200.  Please come back for a follow-up appointment in 3-4 months. blood tests are requested for you today.  We'll let you know about the results.

## 2017-12-15 NOTE — Patient Instructions (Addendum)
Your blood pressure is high today.  Please see your primary care provider soon, to have it rechecked.    Please continue the same insulins for now.  blood tests are requested for you today.  We'll let you know about the results.   check your blood sugar 3 times a day.  vary the time of day when you check, between before the 3 meals, and at bedtime.  also check if you have symptoms of your blood sugar being too high or too low.  please keep a record of the readings and bring it to your next appointment here (or you can bring the meter itself).  You can write it on any piece of paper.  please call us sooner if your blood sugar goes below 70, or if you have a lot of readings over 200.  Please come back for a follow-up appointment in 3-4 months. blood tests are requested for you today.  We'll let you know about the results.

## 2017-12-22 ENCOUNTER — Ambulatory Visit (INDEPENDENT_AMBULATORY_CARE_PROVIDER_SITE_OTHER): Payer: Medicare Other | Admitting: Neurology

## 2017-12-22 ENCOUNTER — Encounter: Payer: Self-pay | Admitting: Neurology

## 2017-12-22 VITALS — BP 158/91 | HR 108 | Ht 65.0 in | Wt 334.0 lb

## 2017-12-22 DIAGNOSIS — R413 Other amnesia: Secondary | ICD-10-CM | POA: Diagnosis not present

## 2017-12-22 DIAGNOSIS — R269 Unspecified abnormalities of gait and mobility: Secondary | ICD-10-CM

## 2017-12-22 HISTORY — DX: Other amnesia: R41.3

## 2017-12-22 MED ORDER — PHENTERMINE HCL 37.5 MG PO CAPS: 37.5000 mg | ORAL_CAPSULE | ORAL | 3 refills | Status: DC

## 2017-12-22 NOTE — Progress Notes (Signed)
Reason for visit: Memory disorder  [name] is an 63 y.o. female  History of present illness:  [name] is a 62 year old right-handed black female with a history of morbid obesity and a history of schizophrenia.  The patient has chronic low back pain, she has had lumbosacral spine surgery previously.  She is currently followed through [name] for her pain control, she is on opiate medications currently.  The patient unfortunately sustained a fall while getting into the shower on 06 November 2017.  The patient had an avulsion fracture of the S1 vertebral body, she has been seen by Dr. Lorin Perez, surgery was not recommended.  The patient has pain in the back and pain down both legs.  She continues to have some mild problems with memory, she is not sleeping well at night, she is noncompliant with her CPAP, she claims that she continues to forget to use it.  The patient has an Environmental consultant who comes into help her daily.  The patient now has pain at all times with standing, sitting, and with lying down.  She has lost about 10 pounds since last seen, we have emphasized that her medical issues are tied in with her weight, if she can lose weight she will have improved diabetic control, lower back pain, better balance, better sleep, and better memory.  She returns for an evaluation.  It is not clear that anyone has ever discussed the possibility of bariatric surgery with her.  In the past, the patient has had several episodes of altered mental status associated with polypharmacy, concurrent use of opiates and benzodiazepines.  Past Medical History:  Diagnosis Date  . Anemia   . Anxiety   . Arthritis    "pretty much all my joints"  . Asthma   . Benign paroxysmal positional vertigo 12/30/2012  . Breast cancer ([facility]) 02/13/12   ruq  100'clock bx Ductal Carcinoma in Situ,(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  . Breast mass in female    L breast 2008, US showed likely fat necrosis  . Chronic lower back  pain   . CKD (chronic kidney disease), stage III ([place])    "lower stage" (01/06/2014)  . Daily headache    last time was 2016/09/06 (brother passed away)  . Depression   . Dyspnea   . Gait abnormality 05/29/2016  . GERD (gastroesophageal reflux disease)   . History of blood transfusion 2011   "after one of my OR's"  . History of hiatal hernia   . History of stomach ulcers   . HSV (herpes simplex virus) infection   . Hx of radiation therapy 05/05/12- 07/15/12   right breast, 45 gray x 25 fx, lumpectomy cavity boosted to 16.2 gray  . Hyperlipemia   . Hypertension    sees [name] , [name]  . Hypothyroidism   . Kidney stones   . Knee pain, bilateral   . Migraines    "~ 3 times/month" (01/06/2014)  . Obesity   . OSA on CPAP    pt does not know settings  . Pancreatitis    hx of  . Personal history of radiation therapy 02/2012  . Pneumonia 1950's  . Polyneuropathy in diabetes(357.2)   . Schizophrenia ([place])   . 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 Tachycardia    with sx in 2008  . Thyroid disease   . Type II diabetes mellitus ([place])    dx 2014    Past Surgical History:  Procedure Laterality Date  .  ABDOMINAL HYSTERECTOMY  1979?   partial  . benign cyst  Left    removed from left breast  . BREAST BIOPSY Right 02/13/2012  . BREAST LUMPECTOMY  03/03/2012   Procedure: LUMPECTOMY;  Surgeon: Haywood Lasso, MD;  Location: [facility];  Service: General;  Laterality: Right;  . BREAST SURGERY Right 01/2012   "cancer"  . CHOLECYSTECTOMY  1980's?  . COLONOSCOPY WITH PROPOFOL N/A 09/28/2012   Procedure: COLONOSCOPY WITH PROPOFOL;  Surgeon: Lear Ng, MD;  [facility];  Service: Endoscopy;  Laterality: N/A;  . FOOT FRACTURE SURGERY Right 1990's  . JOINT REPLACEMENT      x 3  . LUMBAR LAMINECTOMY/DECOMPRESSION MICRODISCECTOMY N/A 12/05/2016   Procedure: LEFT L5-S1 MICRODISCECTOMY;  Surgeon: Marybelle Killings, MD;  Location: [facility];  Service: Orthopedics;   Laterality: N/A;  . REVISION TOTAL KNEE ARTHROPLASTY  07/2009  . SHOULDER OPEN ROTATOR CUFF REPAIR Left 1990's  . THYROIDECTOMY  1970's  . TOTAL KNEE ARTHROPLASTY Bilateral 2009-06/2009   left; right    Family History  Problem Relation Age of Onset  . Heart disease Brother        Multiple MIs, starting in his 34s  . Diabetes Brother   . Thyroid disease Brother   . Hypertension Mother   . Diabetes Mother   . Breast cancer Mother 61  . Bone cancer Mother   . Hypertension Sister   . Diabetes Sister   . Breast cancer Sister 59  . Thyroid disease Sister   . Breast cancer Maternal Grandmother   . Heart disease Maternal Grandmother   . Uterine cancer Other 19  . Breast cancer Paternal Aunt 64  . Breast cancer Paternal Grandmother        dx in her 68s  . Prostate cancer Paternal Grandfather   . Asthma Son     Social history:  reports that she quit smoking about 25 years ago. Her smoking use included cigarettes. She has a 7.00 pack-year smoking history. She has never used smokeless tobacco. She reports that she does not drink alcohol or use drugs.    Allergies  Allergen Reactions  . Bee Venom Anaphylaxis  . Shellfish Allergy Anaphylaxis  . Buprenorphine Hcl Other (See Comments)    Overly sedated  . Other Other (See Comments)    Cats - bad asthma, stopped up    Medications:  Prior to Admission medications   Medication Sig Start Date End Date Taking? Authorizing Provider  albuterol (PROAIR HFA) 108 (90 Base) MCG/ACT inhaler Inhale 2 puffs into the lungs every 4 (four) hours as needed for wheezing or shortness of breath.    Yes [provider]  aspirin EC 81 MG tablet Take 1 tablet (81 mg total) by mouth daily. 08/12/13  Yes Viyuoh, Adeline C, MD  B Complex-C (B-COMPLEX WITH VITAMIN C) tablet Take 1 tablet by mouth daily.   Yes [provider]  budesonide-formoterol (SYMBICORT) 80-4.5 MCG/ACT inhaler Inhale 2 puffs into the lungs 2 (two) times daily. Patient  taking differently: Inhale 1 puff into the lungs 3 (three) times daily.  10/28/16  Yes Tanda Rockers, MD  calcitRIOL (ROCALTROL) 0.25 MCG capsule TAKE ONE CAPSULE BY MOUTH THREE TIMES DAILY WEEKLY Patient taking differently: Take 0.25 mcg by mouth 3 (three) times a week.  11/03/17  Yes Renato Shin, MD  Cholecalciferol (VITAMIN D-3) 1000 units CAPS Take 1,000 Units by mouth daily.   Yes [provider]  Cyanocobalamin (VITAMIN B-12 PO) Take 1 tablet  by mouth daily.   Yes [provider]  diphenhydrAMINE (BENADRYL) 25 MG tablet Take 50 mg by mouth every 6 (six) hours as needed for itching or allergies.   Yes [provider]  doxepin (SINEQUAN) 25 MG capsule Take 50 mg by mouth at bedtime.   Yes [provider]  esomeprazole (NEXIUM) 40 MG capsule Take 1 capsule (40 mg total) by mouth daily before breakfast. Patient taking differently: Take 40 mg by mouth 2 (two) times daily before a meal.  10/30/14  Yes Barton Dubois, MD  furosemide (LASIX) 40 MG tablet Take 1 tablet (40 mg total) by mouth daily. 02/17/16  Yes Florencia Reasons, MD  glucose blood (ONETOUCH VERIO) test strip Check blood sugar 3 times daily 04/10/17  Yes Renato Shin, MD  HUMALOG KWIKPEN 100 UNIT/ML KiwkPen INJECT [place] Patient taking differently: Inject 10 Units into the skin 3 (three) times daily.  05/31/17  Yes Renato Shin, MD  Insulin Detemir (LEVEMIR FLEXTOUCH) 100 UNIT/ML Pen Inject 10 Units into the skin at bedtime. 08/04/17  Yes Renato Shin, MD  Iron-Vitamins (GERITOL PO) Take 1 tablet by mouth daily.   Yes [provider]  JANUMET 50-500 MG tablet Take 1 tablet by mouth 2 (two) times daily with a meal. 11/11/17  Yes Renato Shin, MD  levothyroxine (SYNTHROID, LEVOTHROID) 150 MCG tablet TAKE ONE TABLET BY MOUTH EVERY MORNING BEFORE BREAKFAST Patient taking differently: Take 150 mcg by mouth daily before breakfast.  11/02/17  Yes Renato Shin, MD    linaclotide [facility]) 290 MCG CAPS capsule Take 290 mcg by mouth daily before breakfast.   Yes [provider]  LORazepam (ATIVAN) 2 MG tablet Take 1 mg by mouth 2 (two) times daily.    Yes [provider]  losartan-hydrochlorothiazide (HYZAAR) 100-12.5 MG tablet Take 1 tablet by mouth daily.   Yes [provider]  LYRICA 150 MG capsule Take 150 mg by mouth 3 (three) times daily.  05/20/16  Yes [provider]  methocarbamol (ROBAXIN) 500 MG tablet Take 1 tablet (500 mg total) by mouth every 8 (eight) hours as needed for muscle spasms. 02/13/17  Yes Leandrew Koyanagi, MD  Multiple Vitamin (MULTIVITAMIN WITH MINERALS) TABS tablet Take 1 tablet by mouth daily.   Yes [provider]  oxyCODONE (ROXICODONE) 15 MG immediate release tablet  11/16/17  Yes [provider]  rosuvastatin (CRESTOR) 20 MG tablet Take 20 mg by mouth daily.   Yes [provider]  tiZANidine (ZANAFLEX) 4 MG tablet Take 4 mg by mouth 2 (two) times daily.  03/25/16  Yes [provider]  valACYclovir (VALTREX) 1000 MG tablet Take 1,000 mg by mouth daily.   Yes [provider]  venlafaxine XR (EFFEXOR-XR) 150 MG 24 hr capsule Take 150 mg by mouth 2 (two) times daily. 05/21/15  Yes [provider]  ziprasidone (GEODON) 20 MG capsule Take 1 capsule (20 mg total) by mouth 2 (two) times daily with a meal. Patient taking differently: Take 40 mg by mouth daily.  04/25/16  Yes Ghimire, Henreitta Leber, MD    ROS:  Out of a complete 14 system review of symptoms, the patient complains only of the following symptoms, and all other reviewed systems are negative.  Fatigue, slight weight loss, excessive sweating Ringing the ears, runny nose, difficulty swallowing, drooling Eye discharge, eye redness, eye itching, light sensitivity, double vision, loss of vision, eye pain, blurred vision Wheezing, shortness of breath, choking, chest tightness Chest  pain, leg swelling,  palpitations of the heart Cold intolerance, heat intolerance, excessive thirst, flushing Constipation, nausea, vomiting, rectal pain Restless legs, insomnia, sleep apnea, frequent waking, snoring Frequent infections Incontinence of the bladder, frequency of urination, urinary urgency Joint pain, joint swelling, back pain, aching muscles, muscle cramps, walking difficulty, neck pain, neck stiffness Skin wounds, rash, moles, itching Memory loss, dizziness, headache, numbness, weakness, tremors, facial drooping Agitation, confusion, decreased concentration, depression, anxiety, hallucinations  Blood pressure (!) 158/91, pulse (!) 108, height _0  (1.651 m), weight (!) 334 lb (151.5 kg).  Physical Exam  General: The patient is alert and cooperative at the time of the examination.  The patient is morbidly obese.  Skin: 2+ edema below the knees is seen bilaterally.   Neurologic Exam  Mental status: The patient is alert and oriented x 3 at the time of the examination. The Mini-Mental status examination done today shows a total score 28/30.   Cranial nerves: Facial symmetry is present. Speech is normal, no aphasia or dysarthria is noted. Extraocular movements are full. Visual fields are full.  Motor: The patient has good strength in all 4 extremities.  Sensory examination: Soft touch sensation is symmetric on the face, arms, and legs.  Coordination: The patient has good finger-nose-finger and heel-to-shin bilaterally, but she has difficulty performing heel shin secondary to pain..  Gait and station: The patient was ambulating only short distance secondary to pain, the patient has a slightly wide-based and limping type gait.  Reflexes: Deep tendon reflexes are symmetric.   CT pelvis 11/06/17:  IMPRESSION: 1. Small avulsion type fracture along the anterior most aspect of the S1 vertebral body. No other evident fracture. No diastases.  2.  Degenerative change in the visualized lower  lumbar spine.  3.  No abnormal fluid in the pelvis.  No inflammatory focus.  4. Small midline ventral hernia in the upper pelvic region containing only fat.   Assessment/Plan:  1.  Morbid obesity  2.  Chronic low back pain  3.  Sleep apnea on CPAP, noncompliant  4.  Mild memory disturbance  Once again, the primary medical issue that this patient is suffering from is obesity.  I will give her a prescription for phentermine for weight loss, she was encouraged to use her CPAP regularly.  She will continue with the [facility] for pain control.  At some point in the future, the patient should be considered for bariatric surgery to help promote weight loss.  The patient will follow-up here in about 6 months.  Jill Alexanders MD 12/22/2017 7:45 AM  [facility] [address] [place], [place] [address]  Phone [phone] Fax [phone]

## 2017-12-22 NOTE — Patient Instructions (Signed)
We will start phenteramine for weight loss.

## 2018-01-11 ENCOUNTER — Other Ambulatory Visit: Payer: Self-pay | Admitting: Endocrinology

## 2018-01-19 ENCOUNTER — Other Ambulatory Visit: Payer: Self-pay | Admitting: Endocrinology

## 2018-01-21 ENCOUNTER — Other Ambulatory Visit: Payer: Self-pay

## 2018-01-21 ENCOUNTER — Telehealth: Payer: Self-pay | Admitting: Endocrinology

## 2018-01-21 DIAGNOSIS — E890 Postprocedural hypothyroidism: Secondary | ICD-10-CM

## 2018-01-21 MED ORDER — LEVOTHYROXINE SODIUM 150 MCG PO TABS: 150.0000 ug | ORAL_TABLET | Freq: Every day | ORAL | 0 refills | Status: DC

## 2018-01-21 NOTE — Telephone Encounter (Signed)
MEDICATION: levothyroxine (SYNTHROID, LEVOTHROID) 150 MCG tablet  PHARMACY:  [place], [place]  IS THIS A 90 DAY SUPPLY : Yes  IS PATIENT OUT OF MEDICATION: No  IF NOT; HOW MUCH IS LEFT: 5 days  LAST APPOINTMENT DATE: _0 /04/2017  NEXT APPOINTMENT DATE:_1 /05/2018  DO WE HAVE YOUR PERMISSION TO LEAVE A DETAILED MESSAGE: Yes  OTHER COMMENTS:    **Let patient know to contact pharmacy at the end of the day to make sure medication is ready. **  ** Please notify patient to allow 48-72 hours to process**  **Encourage patient to contact the pharmacy for refills or they can request refills through [facility]**

## 2018-01-21 NOTE — Telephone Encounter (Signed)
Request authorized per protocol and refill sent as requested.

## 2018-02-15 ENCOUNTER — Other Ambulatory Visit: Payer: Self-pay | Admitting: Endocrinology

## 2018-03-05 ENCOUNTER — Other Ambulatory Visit: Payer: Self-pay | Admitting: Endocrinology

## 2018-03-08 ENCOUNTER — Encounter (INDEPENDENT_AMBULATORY_CARE_PROVIDER_SITE_OTHER): Payer: Self-pay | Admitting: Physician Assistant

## 2018-03-08 ENCOUNTER — Ambulatory Visit (INDEPENDENT_AMBULATORY_CARE_PROVIDER_SITE_OTHER): Payer: Self-pay

## 2018-03-08 ENCOUNTER — Ambulatory Visit (INDEPENDENT_AMBULATORY_CARE_PROVIDER_SITE_OTHER): Payer: Medicare Other | Admitting: Physician Assistant

## 2018-03-08 VITALS — Ht 65.0 in | Wt 334.0 lb

## 2018-03-08 DIAGNOSIS — M25561 Pain in right knee: Secondary | ICD-10-CM

## 2018-03-08 DIAGNOSIS — Z6841 Body Mass Index (BMI) 40.0 and over, adult: Secondary | ICD-10-CM | POA: Diagnosis not present

## 2018-03-08 DIAGNOSIS — M48062 Spinal stenosis, lumbar region with neurogenic claudication: Secondary | ICD-10-CM

## 2018-03-08 DIAGNOSIS — Z96653 Presence of artificial knee joint, bilateral: Secondary | ICD-10-CM | POA: Diagnosis not present

## 2018-03-08 DIAGNOSIS — G8929 Other chronic pain: Secondary | ICD-10-CM

## 2018-03-08 NOTE — Progress Notes (Signed)
Office Visit Note   Patient: [name]           Date of Birth: [birth year]           MRN: [id number] Visit Date: 03/08/2018              Requested by: Levin Bacon, NP [address] [place], [place] [address] PCP: Levin Bacon, NP  Chief Complaint  Patient presents with  . Right Knee - Pain      HPI: The patient is a 63 yo woman here for follow up of her right total knee replacement. She had undergone bilateral total knee replacements in the past.   She reports the knee " dislocated" on 02/10/2018 and her son was able to "pop in back into place". She reports pain which begins in her lower back and radiates down the right leg. She feels like the right knee feels loose.  She complains of pain about the right lower back as well that radiates down the leg.  She reports the right leg stays numb all the time.  She also reports that about her right ankle she feels something is tapping over the area frequently.  She states the leg feels numb at times and tingles. She is going to pain management for her chronic back issues. She takes oxycodone for pain.  She was also trying to do physical therapy, but could not tolerate it due to the pain.   Assessment & Plan: Visit Diagnoses:  1. Chronic pain of right knee   2. Total knee replacement status, bilateral   3. Spinal stenosis of lumbar region with neurogenic claudication   4. BMI 50.0-59.9, adult [facility])     Plan: Encouraged the patient to return to physical therapy, but she reports that the pain is so severe she cannot work with therapies. She is diabetic, so I am hesitant to order a course of oral steroids and this likely would provide only temporary relief if any. She will continue to work with her pain management team for pain control.  Will have her follow up with Dr. Sharol Given next week for further evaluation of the right knee. Instructed the patient to try and work on straight leg raises for now.   Follow-Up Instructions: Return  in about 1 week (around 03/15/2018) for Dr. Sharol Given to assess right knee.   Ortho Exam  Patient is alert, oriented, no adenopathy, well-dressed, normal affect, normal respiratory effort. Patient ambulates with a antalgic appearing gait with a walker.  The right knee does have some laxity with varus stress over the lateral joint.  Patient is very weak in her quadriceps bilaterally.  She has pain over the medial ankle and foot as well.  Her knee incisions are well-healed.  Imaging: No results found. No images are attached to the encounter.  Labs: Lab Results  Component Value Date   HGBA1C 6.6 (A) 12/15/2017   HGBA1C 6.8 (A) 08/14/2017   HGBA1C 6.9 06/15/2017   ESRSEDRATE 38 08/04/2017   ESRSEDRATE 42 (H) 05/12/2013   REPTSTATUS 03/29/2016 FINAL 03/27/2016   CULT NO GROWTH 03/27/2016     Lab Results  Component Value Date   ALBUMIN 4.2 08/04/2017   ALBUMIN 3.7 12/04/2016   ALBUMIN 3.9 11/14/2016    Body mass index is 55.58 kg/m.  Orders:  Orders Placed This Encounter  Procedures  . XR Knee 1-2 Views Right   No orders of the defined types were placed in this encounter.    Procedures:  No procedures performed  Clinical Data: No additional findings.  ROS:  All other systems negative, except as noted in the HPI. Review of Systems  Objective: Vital Signs: Ht 5' 5" (1.651 m)   Wt (!) 334 lb (151.5 kg)   BMI 55.58 kg/m   Specialty Comments:  No specialty comments available.  PMFS History: Patient Active Problem List   Diagnosis Date Noted  . Memory difficulty 12/22/2017  . Spinal stenosis 06/09/2017  . HNP (herniated nucleus pulposus), lumbar 12/05/2016  . 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 Hypocalcemia 08/22/2016  . Gait abnormality 05/29/2016  . Encephalopathy acute 04/23/2016  . Hyponatremia 04/23/2016  . Schizophrenia, paranoid ([place])   . Aspiration pneumonia of left lower lobe due to gastric secretions ([place])   . Projectile vomiting  with nausea   . Altered mental status   . Schizophrenia ([place])   . Disorganized schizophrenia ([place])   . Chronic pain syndrome   . Acute renal failure ([place])   . Controlled diabetes mellitus type 2 with complications ([place])   . Acute encephalopathy   . Oropharyngeal dysphagia   . Aspiration pneumonia of right lower lobe due to vomit ([place])   . Acute renal failure (ARF) ([place]) 03/28/2016  . Encephalopathy 03/27/2016  . HCAP (healthcare-associated pneumonia) 02/29/2016  . Pneumonia 02/14/2016  . CAP (community acquired pneumonia) 02/13/2016  . Edema 02/13/2016  . AKI (acute kidney injury) ([place]) 02/13/2016  . Elevated troponin 06/24/2015  . Diarrhea 06/23/2015  . Sepsis ([place]) 06/23/2015  . Morbid (severe) obesity due to excess calories ([place]) 03/26/2015  . Post traumatic stress disorder (PTSD) 11/14/2014  . Leukocytosis 11/12/2014  . Slurred speech 11/12/2014  . CKD (chronic kidney disease) stage 3, GFR 30-59 ml/min ([facility])   . Diabetes mellitus, type II, insulin dependent ([place])   . TIA (transient ischemic attack)   . Hypotension 10/28/2014  . Syncope 10/28/2014  . Anemia 10/28/2014  . Type II diabetes mellitus ([place]) 10/28/2014  . OSA on CPAP 10/28/2014  . Chronic lower back pain 10/28/2014  . Polyneuropathy in diabetes(357.2) 10/28/2014  . Diabetic polyneuropathy ([place])   . Fall 08/14/2014  . Recurrent falls 08/14/2014  . Hot flashes 02/24/2014  . Arthralgia 02/24/2014  . Hair loss 02/24/2014  . Chest pain 01/06/2014  . Shock ([place]) 08/08/2013  . 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 Dysarthria 12/30/2012  . Benign paroxysmal positional vertigo 12/30/2012  . Breast cancer of upper-outer quadrant of right female breast ([place]) 11/18/2012  . Hemorrhage of rectum and anus 09/28/2012  . Hx of radiation therapy   . Syncope and collapse 06/04/2011  . HSV 10/05/2008  . Mild persistent asthma 06/02/2008  . HLD (hyperlipidemia) 06/02/2006  . Sinus tachycardia 05/25/2006  .  Hypothyroidism 02/27/2006  . Depression 02/27/2006  . Essential hypertension 02/27/2006  . GERD 02/27/2006  . KNEE PAIN 02/27/2006   Past Medical History:  Diagnosis Date  . Anemia   . Anxiety   . Arthritis    "pretty much all my joints"  . Asthma   . Benign paroxysmal positional vertigo 12/30/2012  . Breast cancer ([place]) 02/13/12   ruq  100'clock bx Ductal Carcinoma in Situ,(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  . Breast mass in female    L breast 2008, US showed likely fat necrosis  . Chronic lower back pain   . CKD (chronic kidney disease), stage III ([place])    "lower stage" (01/06/2014)  . Daily headache    last time was 09-02-2016 (brother passed away)  .  Depression   . Dyspnea   . Gait abnormality 05/29/2016  . GERD (gastroesophageal reflux disease)   . History of blood transfusion 2011   "after one of my OR's"  . History of hiatal hernia   . History of stomach ulcers   . HSV (herpes simplex virus) infection   . Hx of radiation therapy 05/05/12- 07/15/12   right breast, 45 gray x 25 fx, lumpectomy cavity boosted to 16.2 gray  . Hyperlipemia   . Hypertension    sees Dr. Criss Rosales , [facility]  . Hypothyroidism   . Kidney stones   . Knee pain, bilateral   . Memory difficulty 12/22/2017  . Migraines    "~ 3 times/month" (01/06/2014)  . Obesity   . OSA on CPAP    pt does not know settings  . Pancreatitis    hx of  . Personal history of radiation therapy 02/2012  . Pneumonia 1950's  . Polyneuropathy in diabetes(357.2)   . Schizophrenia ([place])   . 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 Tachycardia    with sx in 2008  . Thyroid disease   . Type II diabetes mellitus ([place])    dx 2014    Family History  Problem Relation Age of Onset  . Heart disease Brother        Multiple MIs, starting in his 22s  . Diabetes Brother   . Thyroid disease Brother   . Hypertension Mother   . Diabetes Mother   . Breast cancer Mother 38  . Bone cancer Mother   . Hypertension Sister   . Diabetes  Sister   . Breast cancer Sister 87  . Thyroid disease Sister   . Breast cancer Maternal Grandmother   . Heart disease Maternal Grandmother   . Uterine cancer Other 19  . Breast cancer Paternal Aunt 23  . Breast cancer Paternal Grandmother        dx in her 80s  . Prostate cancer Paternal Grandfather   . Asthma Son     Past Surgical History:  Procedure Laterality Date  . ABDOMINAL HYSTERECTOMY  1979?   partial  . benign cyst  Left    removed from left breast  . BREAST BIOPSY Right 02/13/2012  . BREAST LUMPECTOMY  03/03/2012   Procedure: LUMPECTOMY;  Surgeon: Haywood Lasso, MD;  Location: [facility];  Service: General;  Laterality: Right;  . BREAST SURGERY Right 01/2012   "cancer"  . CHOLECYSTECTOMY  1980's?  . COLONOSCOPY WITH PROPOFOL N/A 09/28/2012   Procedure: COLONOSCOPY WITH PROPOFOL;  Surgeon: Lear Ng, MD;  [facility];  Service: Endoscopy;  Laterality: N/A;  . FOOT FRACTURE SURGERY Right 1990's  . JOINT REPLACEMENT      x 3  . LUMBAR LAMINECTOMY/DECOMPRESSION MICRODISCECTOMY N/A 12/05/2016   Procedure: LEFT L5-S1 MICRODISCECTOMY;  Surgeon: Marybelle Killings, MD;  Location: [facility];  Service: Orthopedics;  Laterality: N/A;  . REVISION TOTAL KNEE ARTHROPLASTY  07/2009  . SHOULDER OPEN ROTATOR CUFF REPAIR Left 1990's  . THYROIDECTOMY  1970's  . TOTAL KNEE ARTHROPLASTY Bilateral 2009-06/2009   left; right   Social History   Occupational History  . Occupation: Disabled  Tobacco Use  . Smoking status: Former Smoker    Packs/day: 1.00    Years: 7.00    Pack years: 7.00    Types: Cigarettes    Last attempt to quit: 03/24/1992    Years since quitting: 25.9  . Smokeless tobacco: Never Used  Substance and Sexual Activity  .  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 Drug use: No  . Sexual activity: Never    Birth control/protection: Surgical

## 2018-03-15 ENCOUNTER — Encounter (INDEPENDENT_AMBULATORY_CARE_PROVIDER_SITE_OTHER): Payer: Self-pay | Admitting: Orthopedic Surgery

## 2018-03-15 ENCOUNTER — Ambulatory Visit (INDEPENDENT_AMBULATORY_CARE_PROVIDER_SITE_OTHER): Payer: Medicare Other | Admitting: Orthopedic Surgery

## 2018-03-15 VITALS — Ht 65.0 in | Wt 334.0 lb

## 2018-03-15 DIAGNOSIS — Z96653 Presence of artificial knee joint, bilateral: Secondary | ICD-10-CM

## 2018-03-15 DIAGNOSIS — M25571 Pain in right ankle and joints of right foot: Secondary | ICD-10-CM

## 2018-03-15 DIAGNOSIS — Z6841 Body Mass Index (BMI) 40.0 and over, adult: Secondary | ICD-10-CM

## 2018-03-15 DIAGNOSIS — T84012S Broken internal right knee prosthesis, sequela: Secondary | ICD-10-CM

## 2018-03-15 NOTE — Progress Notes (Signed)
Office Visit Note   Patient: [name]           Date of Birth: [birth year]           MRN: [id number] Visit Date: 03/15/2018              Requested by: Levin Bacon, NP [place] [address] [place], [place] [address] PCP: Levin Bacon, NP  Chief Complaint  Patient presents with  . Right Knee - Pain      HPI: Patient is a 63 year old woman who presents in follow-up for her right knee.  She is status post total knee replacements bilaterally and she has had the right total knee revised.  Patient states that over Christmas the kneecap popped out of place her son reduced it and she states she has global pain around the right knee.  She states she has had multiple falls.  Patient also complains of degenerative disc disease.  She complains of instability of the right ankle with rolling her right ankle inward.  Assessment & Plan: Visit Diagnoses:  1. Total knee replacement status, bilateral   2. Failed total knee, right, sequela     Plan: Discussed with the patient with her unstable total knee her only treatment option that would resolve the problem would be a revision to a hinged total knee arthroplasty discussed with her 2 previous surgeries the increased space in the joint that a revision would have higher risks of complications including infection nonhealing of the wound need for additional surgery.  Patient states she is not interested in any type of revision surgery at this time.  We will place her in a knee immobilizer today she is given a prescription for biotech for a Bledsoe hinged brace to see if this would provide her knee and knee stability.  Patient was also given an ankle stabilizing orthosis to provide better support for her ankle.  Discussed nutrition and diet.  Follow-Up Instructions: Return in about 4 weeks (around 04/12/2018).   Ortho Exam  Patient is alert, oriented, no adenopathy, well-dressed, normal affect, normal respiratory effort. Examination patient has a  midline patella today.  She is tender to palpation of the medial lateral joint lines and varus and valgus stress is painful and unstable medially and laterally.  Patient cannot do a straight leg raise without extensor lag.  Patient's right ankle has increased varus instability with a positive anterior drawer and tender to palpation over the anterior talofibular ligament.  Imaging: No results found. No images are attached to the encounter.  Labs: Lab Results  Component Value Date   HGBA1C 6.6 (A) 12/15/2017   HGBA1C 6.8 (A) 08/14/2017   HGBA1C 6.9 06/15/2017   ESRSEDRATE 38 08/04/2017   ESRSEDRATE 42 (H) 05/12/2013   REPTSTATUS 03/29/2016 FINAL 03/27/2016   CULT NO GROWTH 03/27/2016     Lab Results  Component Value Date   ALBUMIN 4.2 08/04/2017   ALBUMIN 3.7 12/04/2016   ALBUMIN 3.9 11/14/2016    Body mass index is 55.58 kg/m.  Orders:  No orders of the defined types were placed in this encounter.  No orders of the defined types were placed in this encounter.    Procedures: No procedures performed  Clinical Data: No additional findings.  ROS:  All other systems negative, except as noted in the HPI. Review of Systems  Objective: Vital Signs: Ht _0  (1.651 m)   Wt (!) 334 lb (151.5 kg)   BMI 55.58 kg/m   Specialty Comments:  No specialty comments available.  PMFS History: Patient Active Problem List   Diagnosis Date Noted  . Memory difficulty 12/22/2017  . Spinal stenosis 06/09/2017  . HNP (herniated nucleus pulposus), lumbar 12/05/2016  . 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 Hypocalcemia 08/22/2016  . Gait abnormality 05/29/2016  . Encephalopathy acute 04/23/2016  . Hyponatremia 04/23/2016  . Schizophrenia, paranoid ([place])   . Aspiration pneumonia of left lower lobe due to gastric secretions ([place])   . Projectile vomiting with nausea   . Altered mental status   . Schizophrenia ([place])   . Disorganized schizophrenia ([place])   .  Chronic pain syndrome   . Acute renal failure ([place])   . Controlled diabetes mellitus type 2 with complications ([place])   . Acute encephalopathy   . Oropharyngeal dysphagia   . Aspiration pneumonia of right lower lobe due to vomit ([place])   . Acute renal failure (ARF) ([place]) 03/28/2016  . Encephalopathy 03/27/2016  . HCAP (healthcare-associated pneumonia) 02/29/2016  . Pneumonia 02/14/2016  . CAP (community acquired pneumonia) 02/13/2016  . Edema 02/13/2016  . AKI (acute kidney injury) ([place]) 02/13/2016  . Elevated troponin 06/24/2015  . Diarrhea 06/23/2015  . Sepsis ([place]) 06/23/2015  . Morbid (severe) obesity due to excess calories ([place]) 03/26/2015  . Post traumatic stress disorder (PTSD) 11/14/2014  . Leukocytosis 11/12/2014  . Slurred speech 11/12/2014  . CKD (chronic kidney disease) stage 3, GFR 30-59 ml/min (HCC)   . Diabetes mellitus, type II, insulin dependent ([place])   . TIA (transient ischemic attack)   . Hypotension 10/28/2014  . Syncope 10/28/2014  . Anemia 10/28/2014  . Type II diabetes mellitus ([place]) 10/28/2014  . OSA on CPAP 10/28/2014  . Chronic lower back pain 10/28/2014  . Polyneuropathy in diabetes(357.2) 10/28/2014  . Diabetic polyneuropathy ([place])   . Fall 08/14/2014  . Recurrent falls 08/14/2014  . Hot flashes 02/24/2014  . Arthralgia 02/24/2014  . Hair loss 02/24/2014  . Chest pain 01/06/2014  . Shock ([place]) 08/08/2013  . 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 Dysarthria 12/30/2012  . Benign paroxysmal positional vertigo 12/30/2012  . Breast cancer of upper-outer quadrant of right female breast ([place]) 11/18/2012  . Hemorrhage of rectum and anus 09/28/2012  . Hx of radiation therapy   . Syncope and collapse 06/04/2011  . HSV 10/05/2008  . Mild persistent asthma 06/02/2008  . HLD (hyperlipidemia) 06/02/2006  . Sinus tachycardia 05/25/2006  . Hypothyroidism 02/27/2006  . Depression 02/27/2006  . Essential hypertension 02/27/2006  . GERD 02/27/2006  .  KNEE PAIN 02/27/2006   Past Medical History:  Diagnosis Date  . Anemia   . Anxiety   . Arthritis    "pretty much all my joints"  . Asthma   . Benign paroxysmal positional vertigo 12/30/2012  . Breast cancer ([place]) 02/13/12   ruq  100'clock bx Ductal Carcinoma in Situ,(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  . Breast mass in female    L breast 2008, US showed likely fat necrosis  . Chronic lower back pain   . CKD (chronic kidney disease), stage III ([place])    "lower stage" (01/06/2014)  . Daily headache    last time was 08/29/16 (brother passed away)  . Depression   . Dyspnea   . Gait abnormality 05/29/2016  . GERD (gastroesophageal reflux disease)   . History of blood transfusion 2011   "after one of my OR's"  . History of hiatal hernia   . History of stomach ulcers   . HSV (herpes simplex  virus) infection   . Hx of radiation therapy 05/05/12- 07/15/12   right breast, 45 gray x 25 fx, lumpectomy cavity boosted to 16.2 gray  . Hyperlipemia   . Hypertension    sees Dr. Criss Rosales , [facility]  . Hypothyroidism   . Kidney stones   . Knee pain, bilateral   . Memory difficulty 12/22/2017  . Migraines    "~ 3 times/month" (01/06/2014)  . Obesity   . OSA on CPAP    pt does not know settings  . Pancreatitis    hx of  . Personal history of radiation therapy 02/2012  . Pneumonia 1950's  . Polyneuropathy in diabetes(357.2)   . Schizophrenia ([place])   . 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 Tachycardia    with sx in 2008  . Thyroid disease   . Type II diabetes mellitus ([place])    dx 2014    Family History  Problem Relation Age of Onset  . Heart disease Brother        Multiple MIs, starting in his 53s  . Diabetes Brother   . Thyroid disease Brother   . Hypertension Mother   . Diabetes Mother   . Breast cancer Mother 66  . Bone cancer Mother   . Hypertension Sister   . Diabetes Sister   . Breast cancer Sister 68  . Thyroid disease Sister   . Breast cancer Maternal Grandmother   . Heart  disease Maternal Grandmother   . Uterine cancer Other 19  . Breast cancer Paternal Aunt 61  . Breast cancer Paternal Grandmother        dx in her 52s  . Prostate cancer Paternal Grandfather   . Asthma Son     Past Surgical History:  Procedure Laterality Date  . ABDOMINAL HYSTERECTOMY  1979?   partial  . benign cyst  Left    removed from left breast  . BREAST BIOPSY Right 02/13/2012  . BREAST LUMPECTOMY  03/03/2012   Procedure: LUMPECTOMY;  Surgeon: Haywood Lasso, MD;  Location: [facility];  Service: General;  Laterality: Right;  . BREAST SURGERY Right 01/2012   "cancer"  . CHOLECYSTECTOMY  1980's?  . COLONOSCOPY WITH PROPOFOL N/A 09/28/2012   Procedure: COLONOSCOPY WITH PROPOFOL;  Surgeon: Lear Ng, MD;  Location: [facility];  Service: Endoscopy;  Laterality: N/A;  . FOOT FRACTURE SURGERY Right 1990's  . JOINT REPLACEMENT      x 3  . LUMBAR LAMINECTOMY/DECOMPRESSION MICRODISCECTOMY N/A 12/05/2016   Procedure: LEFT L5-S1 MICRODISCECTOMY;  Surgeon: Marybelle Killings, MD;  Location: [facility];  Service: Orthopedics;  Laterality: N/A;  . REVISION TOTAL KNEE ARTHROPLASTY  07/2009  . SHOULDER OPEN ROTATOR CUFF REPAIR Left 1990's  . THYROIDECTOMY  1970's  . TOTAL KNEE ARTHROPLASTY Bilateral 2009-06/2009   left; right   Social History   Occupational History  . Occupation: Disabled  Tobacco Use  . Smoking status: Former Smoker    Packs/day: 1.00    Years: 7.00    Pack years: 7.00    Types: Cigarettes    Last attempt to quit: 03/24/1992    Years since quitting: 25.9  . Smokeless tobacco: Never Used  Substance and Sexual Activity  . 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 Drug use: No  . Sexual activity: Never    Birth control/protection: Surgical

## 2018-03-23 ENCOUNTER — Encounter: Payer: Self-pay | Admitting: Endocrinology

## 2018-03-23 ENCOUNTER — Ambulatory Visit (INDEPENDENT_AMBULATORY_CARE_PROVIDER_SITE_OTHER): Payer: Medicare Other | Admitting: Endocrinology

## 2018-03-23 ENCOUNTER — Other Ambulatory Visit: Payer: Self-pay | Admitting: Endocrinology

## 2018-03-23 VITALS — BP 134/80 | HR 114 | Ht 65.0 in | Wt 335.2 lb

## 2018-03-23 DIAGNOSIS — E890 Postprocedural hypothyroidism: Secondary | ICD-10-CM | POA: Diagnosis not present

## 2018-03-23 DIAGNOSIS — N2581 Secondary hyperparathyroidism of renal origin: Secondary | ICD-10-CM | POA: Diagnosis not present

## 2018-03-23 DIAGNOSIS — Z794 Long term (current) use of insulin: Secondary | ICD-10-CM

## 2018-03-23 DIAGNOSIS — E119 Type 2 diabetes mellitus without complications: Secondary | ICD-10-CM | POA: Diagnosis not present

## 2018-03-23 LAB — POCT GLYCOSYLATED HEMOGLOBIN (HGB A1C): HEMOGLOBIN A1C: 6.5 % — AB (ref 4.0–5.6)

## 2018-03-23 LAB — VITAMIN D 25 HYDROXY (VIT D DEFICIENCY, FRACTURES): VITD: 45.33 ng/mL (ref 30.00–100.00)

## 2018-03-23 LAB — TSH: TSH: 8.04 u[IU]/mL — ABNORMAL HIGH (ref 0.35–4.50)

## 2018-03-23 LAB — T4, FREE: Free T4: 0.9 ng/dL (ref 0.60–1.60)

## 2018-03-23 MED ORDER — INSULIN DETEMIR 100 UNIT/ML FLEXPEN: 8.0000 [IU] | PEN_INJECTOR | Freq: Every day | SUBCUTANEOUS | 4 refills | Status: DC

## 2018-03-23 MED ORDER — LEVOTHYROXINE SODIUM 175 MCG PO TABS: 175.0000 ug | ORAL_TABLET | Freq: Every day | ORAL | 3 refills | Status: DC

## 2018-03-23 NOTE — Patient Instructions (Addendum)
Please reduce the levemir to 8 units at bedtime blood tests are requested for you today.  We'll let you know about the results.   check your blood sugar 3 times a day.  vary the time of day when you check, between before the 3 meals, and at bedtime.  also check if you have symptoms of your blood sugar being too high or too low.  please keep a record of the readings and bring it to your next appointment here (or you can bring the meter itself).  You can write it on any piece of paper.  please call us sooner if your blood sugar goes below 70, or if you have a lot of readings over 200.  Please come back for a follow-up appointment in 3-4 months.

## 2018-03-23 NOTE — Progress Notes (Signed)
Subjective:    Patient ID: [name], female    DOB: Feb 29, 1955, 63 y.o.   MRN: [id number]  HPI Pt returns for f/u of postsurgical hypothyroidism (she had thyroidectomy in approx 1988, for a goiter; she says no cancer was found; she has been on prescribed thyroid hormone therapy since then; she has not recently had thyroid imaging; she does not know why TSH fluctuates, but med noncompliance has been noted; she takes synthroid as rx'ed; MRI (2018) showed normal orbits are normal, and no mention was made of the pituitary).  She takes synthroid as rx'ed Pt also returns for f/u of diabetes mellitus:  DM type: Insulin-requiring type 2.   Dx'ed: 3291 Complications: renal insuff and TIA Therapy: insulin since 2015, and janumet GDM: never DKA: never Severe hypoglycemia: never.   Pancreatitis: never Pancreatic imaging: Fatty atrophy was noted on 2018 CT.  Other: she takes multiple daily injections Interval history: Meter is downloaded today, and the printout is scanned into the record.  it varies from 80-186. Almost all are checked fasting. Pt takes meds as rx'ed. No recent steroid shots.   Past Medical History:  Diagnosis Date  . Anemia   . Anxiety   . Arthritis    "pretty much all my joints"  . Asthma   . Benign paroxysmal positional vertigo 12/30/2012  . 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  . Breast mass in female    L breast 2008, US showed likely fat necrosis  . Chronic lower back pain   . CKD (chronic kidney disease), stage III ([place])    "lower stage" (01/06/2014)  . Daily headache    last time was 08/30/2016 (brother passed away)  . Depression   . Dyspnea   . Gait abnormality 05/29/2016  . GERD (gastroesophageal reflux disease)   . History of blood transfusion 2011   "after one of my OR's"  . History of hiatal hernia   . History of stomach ulcers   . HSV (herpes simplex virus) infection   . Hx of radiation  therapy 05/05/12- 07/15/12   right breast, 45 gray x 25 fx, lumpectomy cavity boosted to 16.2 gray  . Hyperlipemia   . Hypertension    sees Dr. Criss Rosales , [facility]  . Hypothyroidism   . Kidney stones   . Knee pain, bilateral   . Memory difficulty 12/22/2017  . Migraines    "~ 3 times/month" (01/06/2014)  . Obesity   . OSA on CPAP    pt does not know settings  . Pancreatitis    hx of  . Personal history of radiation therapy 02/2012  . Pneumonia 1950's  . Polyneuropathy in diabetes(357.2)   . Schizophrenia ([place])   . 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 Tachycardia    with sx in 2008  . Thyroid disease   . Type II diabetes mellitus ([place])    dx 2014    Past Surgical History:  Procedure Laterality Date  . ABDOMINAL HYSTERECTOMY  1979?   partial  . benign cyst  Left    removed from left breast  . BREAST BIOPSY Right 02/13/2012  . BREAST LUMPECTOMY  03/03/2012   Procedure: LUMPECTOMY;  Surgeon: Haywood Lasso, MD;  Location: [facility];  Service: General;  Laterality: Right;  . BREAST SURGERY Right 01/2012   "cancer"  . CHOLECYSTECTOMY  1980's?  . COLONOSCOPY WITH PROPOFOL N/A 09/28/2012   Procedure: COLONOSCOPY WITH PROPOFOL;  Surgeon: Lear Ng, MD;  Location: [facility];  Service: Endoscopy;  Laterality: N/A;  . FOOT FRACTURE SURGERY Right 1990's  . JOINT REPLACEMENT      x 3  . LUMBAR LAMINECTOMY/DECOMPRESSION MICRODISCECTOMY N/A 12/05/2016   Procedure: LEFT L5-S1 MICRODISCECTOMY;  Surgeon: Marybelle Killings, MD;  Location: [facility];  Service: Orthopedics;  Laterality: N/A;  . REVISION TOTAL KNEE ARTHROPLASTY  07/2009  . SHOULDER OPEN ROTATOR CUFF REPAIR Left 1990's  . THYROIDECTOMY  1970's  . TOTAL KNEE ARTHROPLASTY Bilateral 2009-06/2009   left; right    Social History   Socioeconomic History  . Marital status: Divorced    Spouse name: Not on file  . Number of children: 2  . Years of education: 63  . Highest education level: Not on file  Occupational  History  . Occupation: Disabled  Social Needs  . Financial resource strain: Not on file  . Food insecurity:    Worry: Not on file    Inability: Not on file  . Transportation needs:    Medical: Not on file    Non-medical: Not on file  Tobacco Use  . Smoking status: Former Smoker    Packs/day: 1.00    Years: 7.00    Pack years: 7.00    Types: Cigarettes    Last attempt to quit: 03/24/1992    Years since quitting: 26.0  . Smokeless tobacco: Never Used  Substance and Sexual Activity  . 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 Drug use: No  . Sexual activity: Never    Birth control/protection: Surgical  Lifestyle  . Physical activity:    Days per week: Not on file    Minutes per session: Not on file  . Stress: Not on file  Relationships  . Social connections:    Talks on phone: Not on file    Gets together: Not on file    Attends religious service: Not on file    Active member of club or organization: Not on file    Attends meetings of clubs or organizations: Not on file    Relationship status: Not on file  . Intimate partner violence:    Fear of current or ex partner: Not on file    Emotionally abused: Not on file    Physically abused: Not on file    Forced sexual activity: Not on file  Other Topics Concern  . Not on file  Social History Narrative   Single. Disabled Biochemist, clinical.          Patient is right handed.    Denies caffeine use     Current Outpatient Medications on File Prior to Visit  Medication Sig Dispense Refill  . albuterol (PROAIR HFA) 108 (90 Base) MCG/ACT inhaler Inhale 2 puffs into the lungs every 4 (four) hours as needed for wheezing or shortness of breath.     [name] aspirin EC 81 MG tablet Take 1 tablet (81 mg total) by mouth daily. 30 tablet 0  . B Complex-C (B-COMPLEX WITH VITAMIN C) tablet Take 1 tablet by mouth daily.    . budesonide-formoterol (SYMBICORT) 80-4.5 MCG/ACT inhaler Inhale 2 puffs into the  lungs 2 (two) times daily. (Patient taking differently: Inhale 1 puff into the lungs 3 (three) times daily. ) 1 Inhaler 11  . calcitRIOL (ROCALTROL) 0.25 MCG capsule TAKE ONE CAPSULE BY MOUTH THREE TIMES DAILY WEEKLY 13 capsule 2  . Cholecalciferol (VITAMIN D-3) 1000 units  CAPS Take 1,000 Units by mouth daily.    . Cyanocobalamin (VITAMIN B-12 PO) Take 1 tablet by mouth daily.    . diphenhydrAMINE (BENADRYL) 25 MG tablet Take 50 mg by mouth every 6 (six) hours as needed for itching or allergies.    [name] doxepin (SINEQUAN) 25 MG capsule Take 50 mg by mouth at bedtime.    [name] esomeprazole (NEXIUM) 40 MG capsule Take 1 capsule (40 mg total) by mouth daily before breakfast. (Patient taking differently: Take 40 mg by mouth 2 (two) times daily before a meal. ) 30 capsule 1  . furosemide (LASIX) 40 MG tablet Take 1 tablet (40 mg total) by mouth daily. 30 tablet 0  . glucose blood (ONETOUCH VERIO) test strip Check blood sugar 3 times daily 100 each 12  . HUMALOG KWIKPEN 100 UNIT/ML KwikPen INJECT 10 UNITS THREE TIMES DAILY 15 mL 4  . Iron-Vitamins (GERITOL PO) Take 1 tablet by mouth daily.    [name] linaclotide (LINZESS) 290 MCG CAPS capsule Take 290 mcg by mouth daily before breakfast.    . LORazepam (ATIVAN) 2 MG tablet Take 1 mg by mouth 2 (two) times daily. Take 1/4 tab in the morning and 1/2 tab at night    . losartan-hydrochlorothiazide (HYZAAR) 100-12.5 MG tablet Take 1 tablet by mouth daily.    [name] LYRICA 150 MG capsule Take 150 mg by mouth 3 (three) times daily.   1  . methocarbamol (ROBAXIN) 500 MG tablet Take 1 tablet (500 mg total) by mouth every 8 (eight) hours as needed for muscle spasms. 30 tablet 0  . Multiple Vitamin (MULTIVITAMIN WITH MINERALS) TABS tablet Take 1 tablet by mouth daily.    [name] oxyCODONE (ROXICODONE) 15 MG immediate release tablet Take 14m per day    . rosuvastatin (CRESTOR) 20 MG tablet Take 20 mg by mouth daily.    .Marland KitchentiZANidine (ZANAFLEX) 4 MG tablet Take 4 mg by mouth 2 (two) times  daily.     . valACYclovir (VALTREX) 1000 MG tablet Take 1,000 mg by mouth 2 (two) times daily.     .Marland Kitchenvenlafaxine XR (EFFEXOR-XR) 150 MG 24 hr capsule Take 150 mg by mouth 2 (two) times daily.    . ziprasidone (GEODON) 20 MG capsule Take 1 capsule (20 mg total) by mouth 2 (two) times daily with a meal. (Patient taking differently: Take 40 mg by mouth daily. )    . phentermine 37.5 MG capsule Take 1 capsule (37.5 mg total) by mouth every morning. (Patient not taking: Reported on 03/23/2018) 30 capsule 3   No current facility-administered medications on file prior to visit.     Allergies  Allergen Reactions  . Bee Venom Anaphylaxis  . Shellfish Allergy Anaphylaxis  . Buprenorphine Hcl Other (See Comments)    Overly sedated  . Other Other (See Comments)    Cats - bad asthma, stopped up    Family History  Problem Relation Age of Onset  . Heart disease Brother        Multiple MIs, starting in his 436s . Diabetes Brother   . Thyroid disease Brother   . Hypertension Mother   . Diabetes Mother   . Breast cancer Mother 653 . Bone cancer Mother   . Hypertension Sister   . Diabetes Sister   . Breast cancer Sister 526 . Thyroid disease Sister   . Breast cancer Maternal Grandmother   . Heart disease Maternal Grandmother   . Uterine cancer Other 19  .  Breast cancer Paternal Aunt 45  . Breast cancer Paternal Grandmother        dx in her 48s  . Prostate cancer Paternal Grandfather   . Asthma Son     BP 134/80 (BP Location: Left Arm, Patient Position: Sitting, Cuff Size: Large)   Pulse (!) 114   Ht _0  (1.651 m)   Wt (!) 335 lb 3.2 oz (152 kg)   SpO2 95%   BMI 55.78 kg/m    Review of Systems She denies hypoglycemia    Objective:   Physical Exam VITAL SIGNS:  See vs page GENERAL: no distress Pulses: foot pulses are intact bilaterally.   MSK: no deformity of the feet or ankles.  CV: trace bilat edema of the legs.  Skin:  no ulcer on the feet or ankles.  normal color and temp  on the feet and ankles Neuro: sensation is intact to touch on the feet and ankles.   Lab Results  Component Value Date   HGBA1C 6.5 (A) 03/23/2018   Lab Results  Component Value Date   CREATININE 1.29 (H) 08/04/2017   BUN 20 08/04/2017   NA 137 08/04/2017   K 4.8 08/04/2017   CL 95 (L) 08/04/2017   CO2 25 08/04/2017   Lab Results  Component Value Date   TSH 8.04 (H) 03/23/2018      Assessment & Plan:  Insulin-requiring type 2 DM, with CVA: overcontrolled Renal insuff: I this setting, she needs mostly mealtime insulin. Hypothyroidism: she needs increased rx.    Patient Instructions  Please reduce the levemir to 8 units at bedtime blood tests are requested for you today.  We'll let you know about the results.   check your blood sugar 3 times a day.  vary the time of day when you check, between before the 3 meals, and at bedtime.  also check if you have symptoms of your blood sugar being too high or too low.  please keep a record of the readings and bring it to your next appointment here (or you can bring the meter itself).  You can write it on any piece of paper.  please call us sooner if your blood sugar goes below 70, or if you have a lot of readings over 200.  Please come back for a follow-up appointment in 3-4 months.

## 2018-03-24 LAB — PTH, INTACT AND CALCIUM
Calcium: 9.5 mg/dL (ref 8.6–10.4)
PTH: 31 pg/mL (ref 14–64)

## 2018-04-09 ENCOUNTER — Other Ambulatory Visit: Payer: Self-pay | Admitting: Endocrinology

## 2018-04-12 ENCOUNTER — Ambulatory Visit (INDEPENDENT_AMBULATORY_CARE_PROVIDER_SITE_OTHER): Payer: Medicare Other | Admitting: Orthopedic Surgery

## 2018-04-19 ENCOUNTER — Other Ambulatory Visit: Payer: Self-pay | Admitting: Endocrinology

## 2018-04-19 DIAGNOSIS — Z794 Long term (current) use of insulin: Principal | ICD-10-CM

## 2018-04-19 DIAGNOSIS — E119 Type 2 diabetes mellitus without complications: Secondary | ICD-10-CM

## 2018-04-20 ENCOUNTER — Ambulatory Visit (INDEPENDENT_AMBULATORY_CARE_PROVIDER_SITE_OTHER): Payer: Medicare Other | Admitting: Orthopaedic Surgery

## 2018-04-20 ENCOUNTER — Encounter (INDEPENDENT_AMBULATORY_CARE_PROVIDER_SITE_OTHER): Payer: Self-pay | Admitting: Orthopaedic Surgery

## 2018-04-20 ENCOUNTER — Telehealth (INDEPENDENT_AMBULATORY_CARE_PROVIDER_SITE_OTHER): Payer: Self-pay | Admitting: Radiology

## 2018-04-20 VITALS — BP 142/81 | HR 111 | Ht 65.0 in | Wt 314.0 lb

## 2018-04-20 DIAGNOSIS — M48062 Spinal stenosis, lumbar region with neurogenic claudication: Secondary | ICD-10-CM

## 2018-04-20 NOTE — Progress Notes (Signed)
Office Visit Note   Patient: [name]           Date of Birth: [birth year]           MRN: [id number] Visit Date: 04/20/2018              Requested by: Levin Bacon, NP [address] [place], [place] [address] PCP: Levin Bacon, NP   Assessment & Plan: Visit Diagnoses:  1. Spinal stenosis of lumbar region with neurogenic claudication     Plan: We will set patient up for repeat MRI scan lumbar.  She may be a candidate for multilevel fusion instrumentation for her central foraminal stenosis with her body habitus cortical trajectory screw fixation would be preferable than standard pedicle instrumentation.  We will set her up for an MRI scan and let her see Dr. Louanne Skye after the surgery.  Patient's been through physical therapy use a TENS unit, been on chronic pain medication.  She not responded to conservative treatment and has had progressive pain over 6 months with repetitive falling.  Patient states she cannot sleep an MRI scan is aware-year-old.  Previous MRI scan showed severe multifactorial stenosis at L3-4 and other changes central and foraminal only at L4-5 with recurrent disc at L5-S1 and new scan is indicated for potential preoperative planning.  Follow-Up Instructions: No follow-ups on file.   Orders:  No orders of the defined types were placed in this encounter.  No orders of the defined types were placed in this encounter.     Procedures: No procedures performed   Clinical Data: No additional findings.   Subjective: Chief Complaint  Patient presents with  . Lower Back - Follow-up    HPI 63 year old female returns she states she is in significant pain with back and right leg pain worse than left.  She has been on chronic pain management and has had her oxycodone increased from 10 mg 4 times a day to 15 mg with little relief.  She states she has pain when she sits pain with standing pain with laying down.  She is having to use a walker for ambulation.  She  states she has not slept in 4 nights.  She denies bowel or bladder associated symptoms no fever or chills.  Previous left L5-S1 microdiscectomy 12/05/2016 with initial relief for a month or so and then recurrence of pain and follow-up MRI scan showed enlargement of the protrusion with posterior displacement of the left S1 nerve root at L5-S1.  MRI 04/17/2017 also showed left greater than right foraminal narrowing at L4-5 with annular bulge and facet hypertrophy and ligamentous hypertrophy.  Moderate multifactorial central stenosis.  Progressive multifactorial stenosis at L3-4 now severe.  Patient has some short pedicles.  She has had bilateral total knee arthroplasties and saw Dr. Sharol Given in January after an episode where she states her kneecap popped out of place and her son had to reduce it.  She has had bilateral total knee arthroplasties the right knee was done in 2009 by Dr. Meridee Score.  Review of Systems 14 point view of systems positive for a chronic back pain with disc degeneration.  Secondary Parkinson's.,  Hypothyroidism, depression, hypertension, GERD, syncope.,  Breast cancer right wrist.  Dysarthria.  History of radiation treatment, stage III kidney disease type type 2 diabetes on insulin.  Morbid obesity, encephalopathy, disorganized schizophrenia, chronic pain on chronic narcotic medication, fall in the shower in October with chip evulsion fracture off S1 anteriorly.  History of multiple  falls.  Otherwise negative is a pertains to HPI.   Objective: Vital Signs: BP (!) 142/81   Pulse (!) 111   Ht _0  (1.651 m)   Wt (!) 314 lb (142.4 kg)   BMI 52.25 kg/m   Physical Exam Constitutional:      Appearance: She is well-developed.  HENT:     Head: Normocephalic.     Right Ear: External ear normal.     Left Ear: External ear normal.  Eyes:     Pupils: Pupils are equal, round, and reactive to light.  Neck:     Thyroid: No thyromegaly.     Trachea: No tracheal deviation.  Cardiovascular:       Rate and Rhythm: Normal rate.  Pulmonary:     Effort: Pulmonary effort is normal.  Abdominal:     Palpations: Abdomen is soft.  Skin:    General: Skin is warm and dry.  Neurological:     Mental Status: She is alert and oriented to person, place, and time.  Psychiatric:        Behavior: Behavior normal.     Ortho Exam patient morbidly obese.  She has a trace knee effusion on the right well-healed midline incision.  No pain with hip range of motion.  Patient ambulates with a rolling walker.  Negative straight leg raising 90 degrees.  Anterior tib gastrocsoleus is active and takes resistance.  She is slow getting from sitting to standing.  Specialty Comments:  No specialty comments available.  Imaging: Study Result   CLINICAL DATA:  Chronic diffuse back pain for years. Pain radiates to both legs. Back surgery in 2018 without relief.  Creatinine was obtained on site at [facility] at [address]  Results: Creatinine 1.2 mg/dL.  EXAM: MRI LUMBAR SPINE WITHOUT AND WITH CONTRAST  TECHNIQUE: Multiplanar and multiecho pulse sequences of the lumbar spine were obtained without and with intravenous contrast.  CONTRAST:  61m MULTIHANCE GADOBENATE DIMEGLUMINE 529 MG/ML IV SOLN  COMPARISON:  Radiographs 12/05/2016, abdominal CT 04/02/2016 and lumbar MRI 03/01/2016.  FINDINGS: Segmentation: Conventional anatomy assumed, with the last open disc space designated L5-S1.  Alignment: Mild convex-right scoliosis. Stable compared with previous study.  Vertebrae: No worrisome osseous lesion, acute fracture or pars defect. There is multilevel facet disease. The lumbar pedicles are somewhat short on a congenital basis. The visualized sacroiliac joints appear unremarkable.  Conus medullaris: Extends to the T12-L1 level and appears normal. No abnormal intradural enhancement, although possible mild clumping of the nerve roots in the distal thecal  sac.  Paraspinal and other soft tissues: No significant paraspinal findings.  Disc levels:  No significant disc space findings from T11-12 through L1-2.  L2-3: Annular disc bulging mildly eccentric to the right with mild facet and ligamentous hypertrophy. No significant spinal stenosis or nerve root encroachment.  L3-4: Progressive annular disc bulging, facet and ligamentous hypertrophy resulting in severe spinal stenosis. There is moderate lateral recess and foraminal narrowing bilaterally which has progressed.  L4-5: Mildly progressive annular disc bulging with advanced facet and ligamentous hypertrophy. The resulting moderate multifactorial spinal stenosis and asymmetric narrowing of the left lateral recess have not significantly changed. There is left-greater-than-right foraminal narrowing which appears slightly worse.  L5-S1: Interval left laminectomy and discectomy. Left paracentral disc protrusion has mildly enlarged with resulting mild mass effect on the thecal sac and mild posterior displacement of the left S1 nerve root. There is some enhancement surrounding the S1 nerve roots. Bilateral facet hypertrophy and mild  left-greater-than-right foraminal narrowing appears unchanged.  IMPRESSION: 1. Interval surgery on the left at L5-S1. Left paracentral disc protrusion has mildly enlarged with mild posterior displacement of the left S1 nerve root. 2. Mildly progressive left-greater-than-right foraminal narrowing at L4-5 secondary to annular disc bulging, facet and ligamentous hypertrophy. The associated moderate multifactorial spinal stenosis at that level is unchanged. 3. Progressive multifactorial spinal stenosis at L3-4, now severe, with progressive narrowing of the lateral recesses and foramina bilaterally. Possible bilateral L3 nerve root encroachment.   Electronically Signed   By: Richardean Sale M.D.   On: 04/17/2017 14:02      PMFS  History: Patient Active Problem List   Diagnosis Date Noted  . Hyperparathyroidism, secondary ([place]) 03/23/2018  . Memory difficulty 12/22/2017  . Spinal stenosis 06/09/2017  . HNP (herniated nucleus pulposus), lumbar 12/05/2016  . 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 Hypocalcemia 08/22/2016  . Gait abnormality 05/29/2016  . Encephalopathy acute 04/23/2016  . Hyponatremia 04/23/2016  . Schizophrenia, paranoid ([place])   . Aspiration pneumonia of left lower lobe due to gastric secretions ([place])   . Projectile vomiting with nausea   . Altered mental status   . Schizophrenia ([place])   . Disorganized schizophrenia ([place])   . Chronic pain syndrome   . Acute renal failure ([place])   . Controlled diabetes mellitus type 2 with complications ([place])   . Acute encephalopathy   . Oropharyngeal dysphagia   . Aspiration pneumonia of right lower lobe due to vomit ([place])   . Acute renal failure (ARF) ([place]) 03/28/2016  . Encephalopathy 03/27/2016  . HCAP (healthcare-associated pneumonia) 02/29/2016  . Pneumonia 02/14/2016  . CAP (community acquired pneumonia) 02/13/2016  . Edema 02/13/2016  . AKI (acute kidney injury) ([place]) 02/13/2016  . Elevated troponin 06/24/2015  . Diarrhea 06/23/2015  . Sepsis ([place]) 06/23/2015  . Morbid (severe) obesity due to excess calories ([place]) 03/26/2015  . Post traumatic stress disorder (PTSD) 11/14/2014  . Leukocytosis 11/12/2014  . Slurred speech 11/12/2014  . CKD (chronic kidney disease) stage 3, GFR 30-59 ml/min (HCC)   . Diabetes mellitus, type II, insulin dependent ([place])   . TIA (transient ischemic attack)   . Hypotension 10/28/2014  . Syncope 10/28/2014  . Anemia 10/28/2014  . Type II diabetes mellitus ([place]) 10/28/2014  . OSA on CPAP 10/28/2014  . Chronic lower back pain 10/28/2014  . Polyneuropathy in diabetes(357.2) 10/28/2014  . Diabetic polyneuropathy ([place])   . Fall 08/14/2014  . Recurrent falls 08/14/2014  . Hot flashes 02/24/2014  .  Arthralgia 02/24/2014  . Hair loss 02/24/2014  . Chest pain 01/06/2014  . Shock ([place]) 08/08/2013  . Secondary parkinsonism ([place]) 12/30/2012  . Dysarthria 12/30/2012  . Benign paroxysmal positional vertigo 12/30/2012  . Breast cancer of upper-outer quadrant of right female breast ([place]) 11/18/2012  . Hemorrhage of rectum and anus 09/28/2012  . Hx of radiation therapy   . Syncope and collapse 06/04/2011  . HSV 10/05/2008  . Mild persistent asthma 06/02/2008  . HLD (hyperlipidemia) 06/02/2006  . Sinus tachycardia 05/25/2006  . Hypothyroidism 02/27/2006  . Depression 02/27/2006  . Essential hypertension 02/27/2006  . GERD 02/27/2006  . KNEE PAIN 02/27/2006   Past Medical History:  Diagnosis Date  . Anemia   . Anxiety   . Arthritis    "pretty much all my joints"  . Asthma   . Benign paroxysmal positional vertigo 12/30/2012  . Breast cancer ([place]) 02/13/12   ruq  100'clock bx Ductal Carcinoma in Situ,(0/1) lymph node  neg.  . Breast mass in female    L breast 2008, US showed likely fat necrosis  . Chronic lower back pain   . CKD (chronic kidney disease), stage III ([place])    "lower stage" (01/06/2014)  . Daily headache    last time was 09/16/2016 (brother passed away)  . Depression   . Dyspnea   . Gait abnormality 05/29/2016  . GERD (gastroesophageal reflux disease)   . History of blood transfusion 2011   "after one of my OR's"  . History of hiatal hernia   . History of stomach ulcers   . HSV (herpes simplex virus) infection   . Hx of radiation therapy 05/05/12- 07/15/12   right breast, 45 gray x 25 fx, lumpectomy cavity boosted to 16.2 gray  . Hyperlipemia   . Hypertension    sees Dr. Criss Rosales , [place]  . Hypothyroidism   . Kidney stones   . Knee pain, bilateral   . Memory difficulty 12/22/2017  . Migraines    "~ 3 times/month" (01/06/2014)  . Obesity   . OSA on CPAP    pt does not know settings  . Pancreatitis    hx of  . Personal history of radiation therapy 02/2012   . Pneumonia 1950's  . Polyneuropathy in diabetes(357.2)   . Schizophrenia ([place])   . Secondary parkinsonism ([place]) 12/30/2012  . Tachycardia    with sx in 2008  . Thyroid disease   . Type II diabetes mellitus ([place])    dx 2014    Family History  Problem Relation Age of Onset  . Heart disease Brother        Multiple MIs, starting in his 53s  . Diabetes Brother   . Thyroid disease Brother   . Hypertension Mother   . Diabetes Mother   . Breast cancer Mother 44  . Bone cancer Mother   . Hypertension Sister   . Diabetes Sister   . Breast cancer Sister 41  . Thyroid disease Sister   . Breast cancer Maternal Grandmother   . Heart disease Maternal Grandmother   . Uterine cancer Other 19  . Breast cancer Paternal Aunt 56  . Breast cancer Paternal Grandmother        dx in her 28s  . Prostate cancer Paternal Grandfather   . Asthma Son     Past Surgical History:  Procedure Laterality Date  . ABDOMINAL HYSTERECTOMY  1979?   partial  . benign cyst  Left    removed from left breast  . BREAST BIOPSY Right 02/13/2012  . BREAST LUMPECTOMY  03/03/2012   Procedure: LUMPECTOMY;  Surgeon: Haywood Lasso, MD;  Location: [facility];  Service: General;  Laterality: Right;  . BREAST SURGERY Right 01/2012   "cancer"  . CHOLECYSTECTOMY  1980's?  . COLONOSCOPY WITH PROPOFOL N/A 09/28/2012   Procedure: COLONOSCOPY WITH PROPOFOL;  Surgeon: Lear Ng, MD;  Location: [facility];  Service: Endoscopy;  Laterality: N/A;  . FOOT FRACTURE SURGERY Right 1990's  . JOINT REPLACEMENT      x 3  . LUMBAR LAMINECTOMY/DECOMPRESSION MICRODISCECTOMY N/A 12/05/2016   Procedure: LEFT L5-S1 MICRODISCECTOMY;  Surgeon: Marybelle Killings, MD;  Location: [facility];  Service: Orthopedics;  Laterality: N/A;  . REVISION TOTAL KNEE ARTHROPLASTY  07/2009  . SHOULDER OPEN ROTATOR CUFF REPAIR Left 1990's  . THYROIDECTOMY  1970's  . TOTAL KNEE ARTHROPLASTY Bilateral 2009-06/2009   left; right   Social History    Occupational History  . Occupation:  Disabled  Tobacco Use  . Smoking status: Former Smoker    Packs/day: 1.00    Years: 7.00    Pack years: 7.00    Types: Cigarettes    Last attempt to quit: 03/24/1992    Years since quitting: 26.0  . Smokeless tobacco: Never Used  Substance and Sexual Activity  . 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 Drug use: No  . Sexual activity: Never    Birth control/protection: Surgical

## 2018-04-20 NOTE — Telephone Encounter (Signed)
Dr. Lorin Mercy would like for patient to see Dr. Louanne Skye for opinion on surgical option. He will dictate note in regards to what he and patient discussed. Please call patient with appointment date and time.  She states that her phone numbers are correct in the chart.

## 2018-04-21 ENCOUNTER — Telehealth (INDEPENDENT_AMBULATORY_CARE_PROVIDER_SITE_OTHER): Payer: Self-pay | Admitting: Radiology

## 2018-04-21 ENCOUNTER — Other Ambulatory Visit: Payer: Self-pay | Admitting: Family Medicine

## 2018-04-21 ENCOUNTER — Encounter (INDEPENDENT_AMBULATORY_CARE_PROVIDER_SITE_OTHER): Payer: Self-pay | Admitting: Orthopaedic Surgery

## 2018-04-21 DIAGNOSIS — Z1231 Encounter for screening mammogram for malignant neoplasm of breast: Secondary | ICD-10-CM

## 2018-04-21 NOTE — Telephone Encounter (Signed)
I called patient to advise we were going to order new MRI prior to appointment with Dr. Louanne Skye.  Patient states that she spoke with her endocrinologist this morning and he told her under no circumstance should she have any surgery unless it is a life threatening emergency.  She is going to cancel seeing Dr. Louanne Skye, but wants to know if you would like to still proceed with MRI or if there is anything else that you could offer her?

## 2018-04-21 NOTE — Telephone Encounter (Signed)
Per Gwinda Passe, patient spoke with Endocrinology and they have told her only way she can have surgery is if it is life threatening. So she doesn't want to sched appt with [facility] at this point due to being a waste of time for both.

## 2018-04-21 NOTE — Telephone Encounter (Signed)
No leave it if she cannot have surgery. She can return in 6 months or if her endocrinologist gives her the OK for elective surgery then she can call us and we can get MRI. thanks

## 2018-04-21 NOTE — Telephone Encounter (Signed)
I left voicemail for patient advising.

## 2018-04-26 ENCOUNTER — Other Ambulatory Visit: Payer: Self-pay | Admitting: Endocrinology

## 2018-05-03 ENCOUNTER — Ambulatory Visit (INDEPENDENT_AMBULATORY_CARE_PROVIDER_SITE_OTHER): Payer: Medicare Other | Admitting: Orthopedic Surgery

## 2018-05-06 ENCOUNTER — Ambulatory Visit: Payer: Medicaid Other | Admitting: Adult Health

## 2018-05-21 ENCOUNTER — Other Ambulatory Visit: Payer: Self-pay | Admitting: Internal Medicine

## 2018-06-01 ENCOUNTER — Ambulatory Visit: Payer: Self-pay

## 2018-06-02 IMAGING — CR DG LUMBAR SPINE 2-3V
3 series · 3 of 3 positions shown · non-contrast
Comparison: CT 04/02/2016.

CLINICAL DATA: Lumbar surgery.

EXAM:
LUMBAR SPINE - 2-3 VIEW

[xtable lateral (1 of 3)]
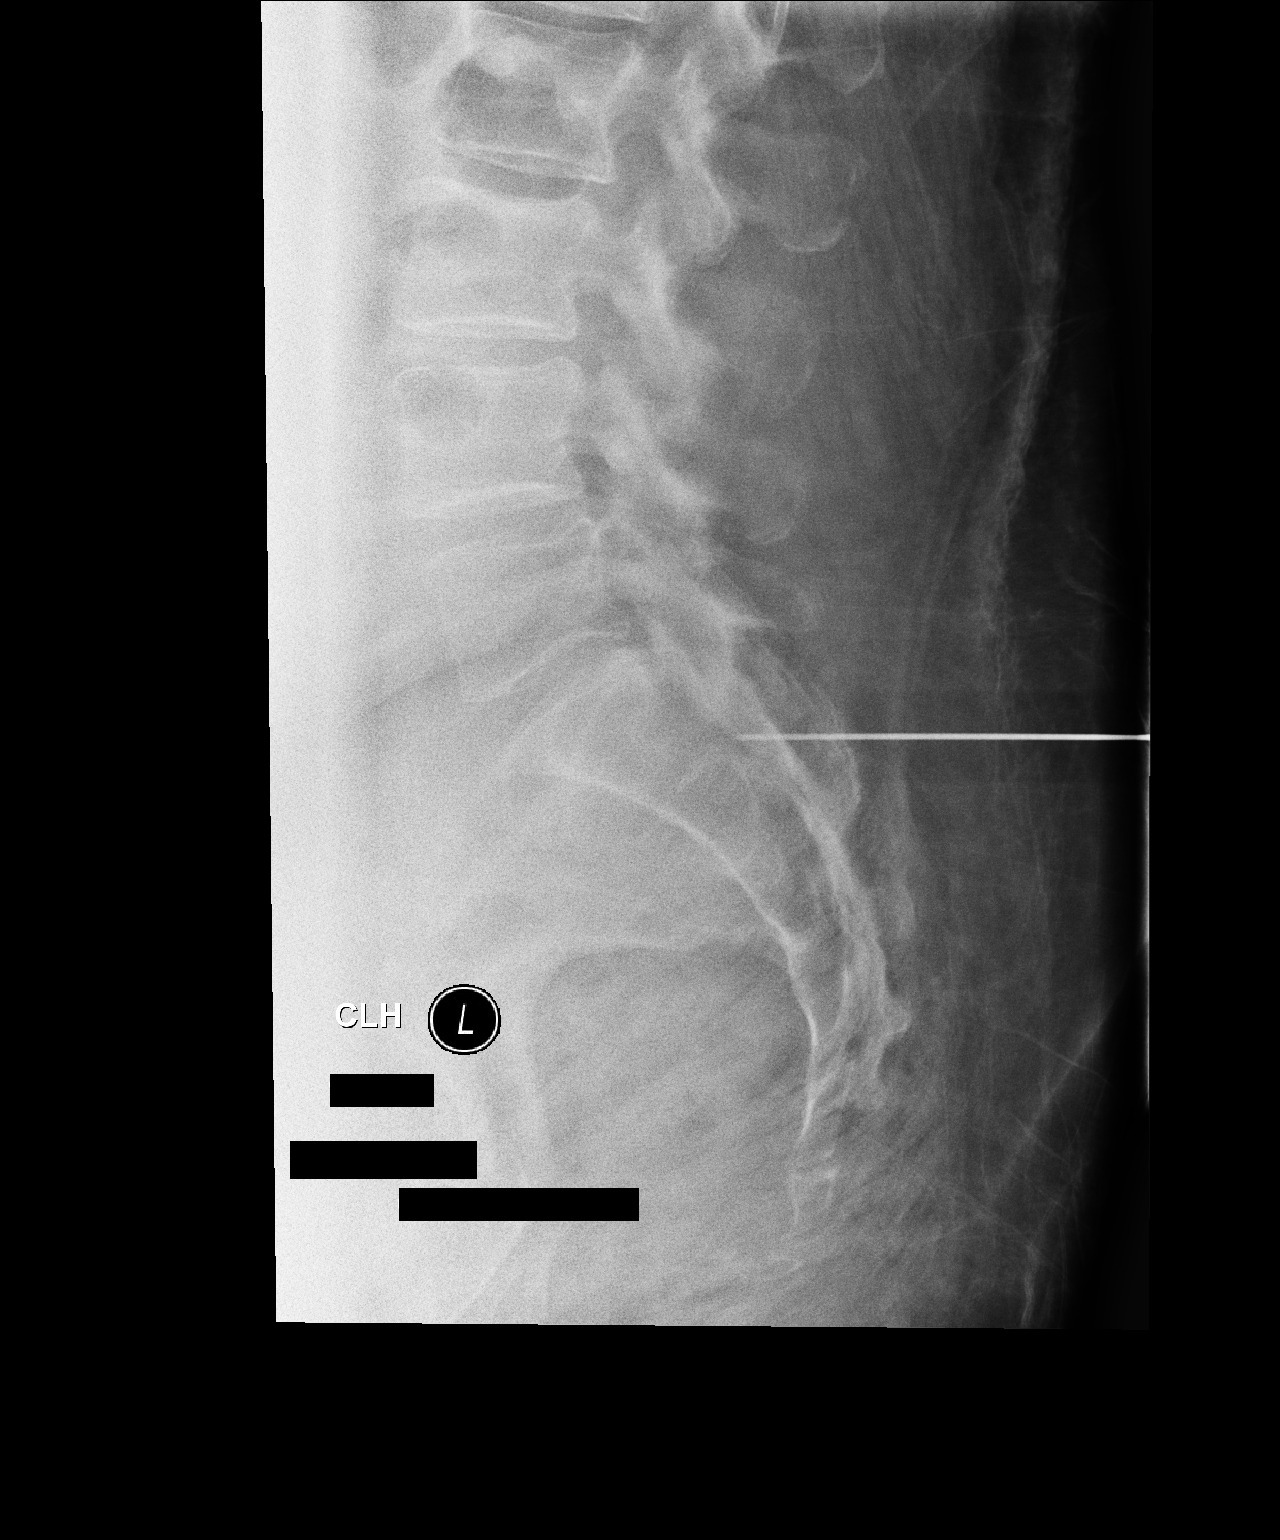

[xtable lateral (2 of 3)]
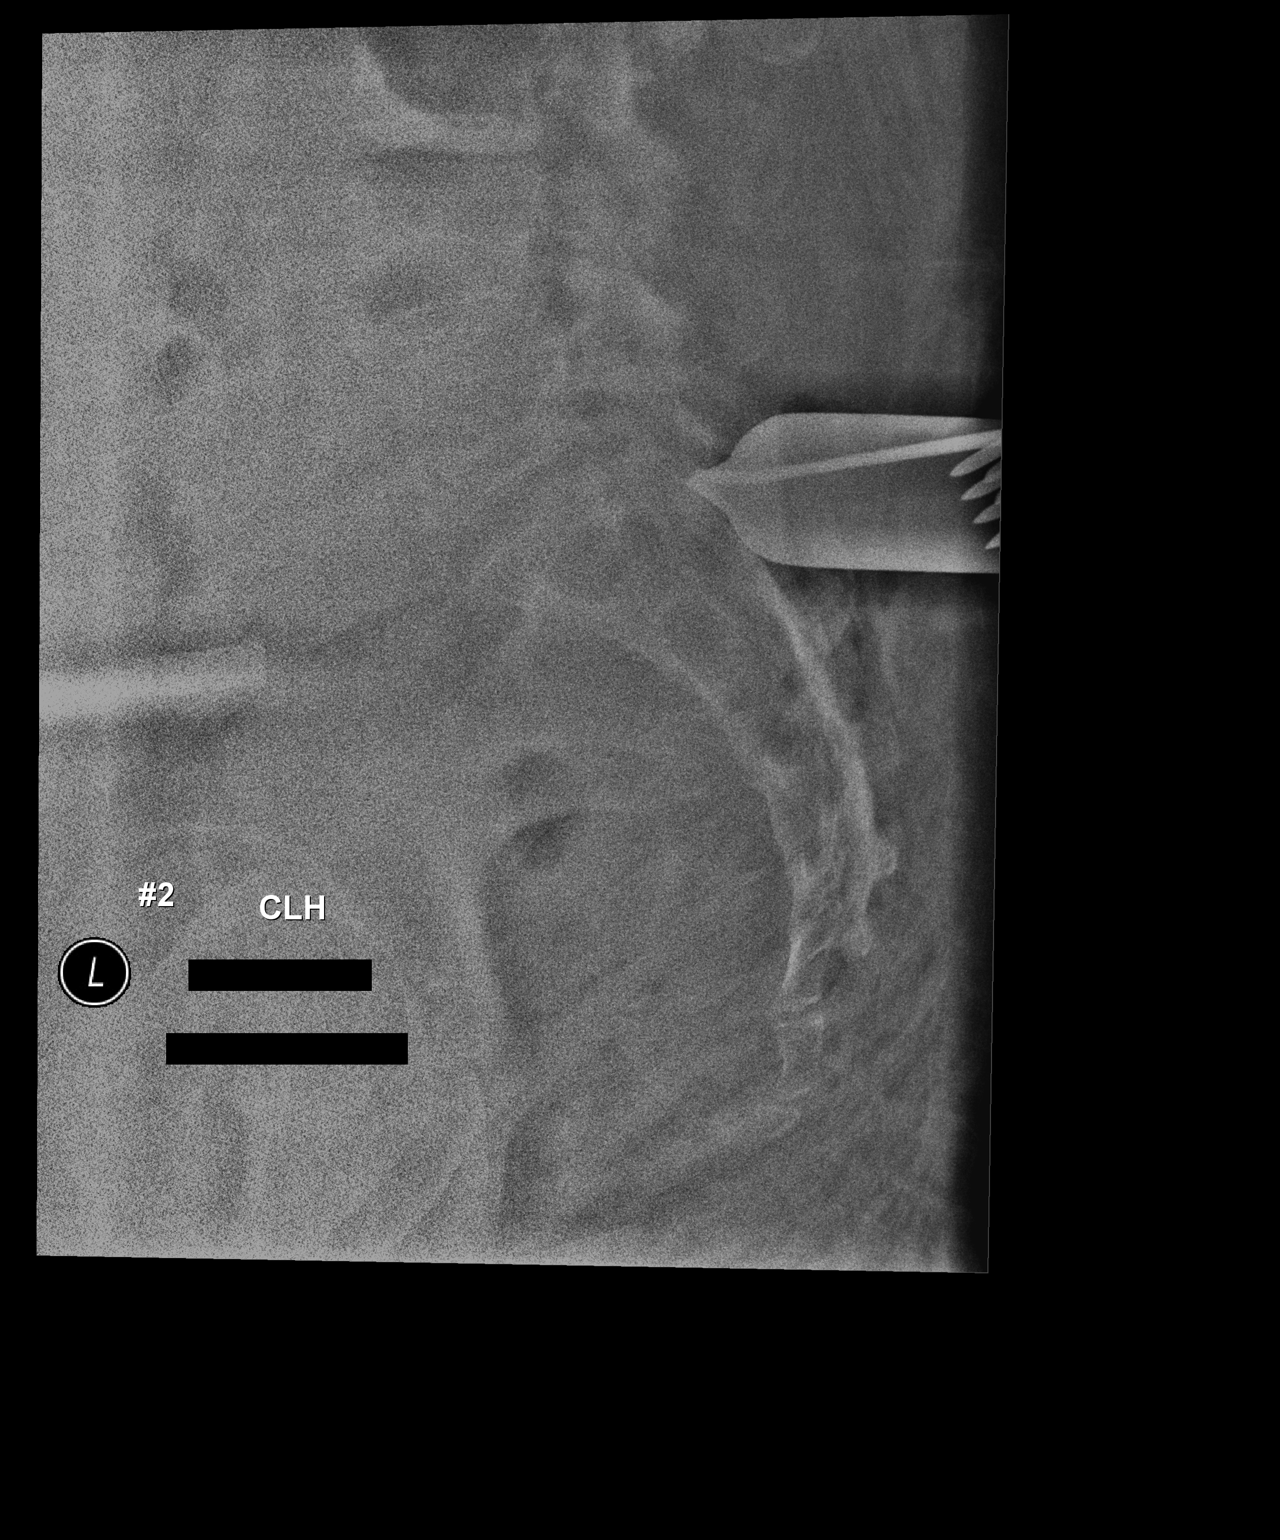

[xtable lateral (3 of 3)]
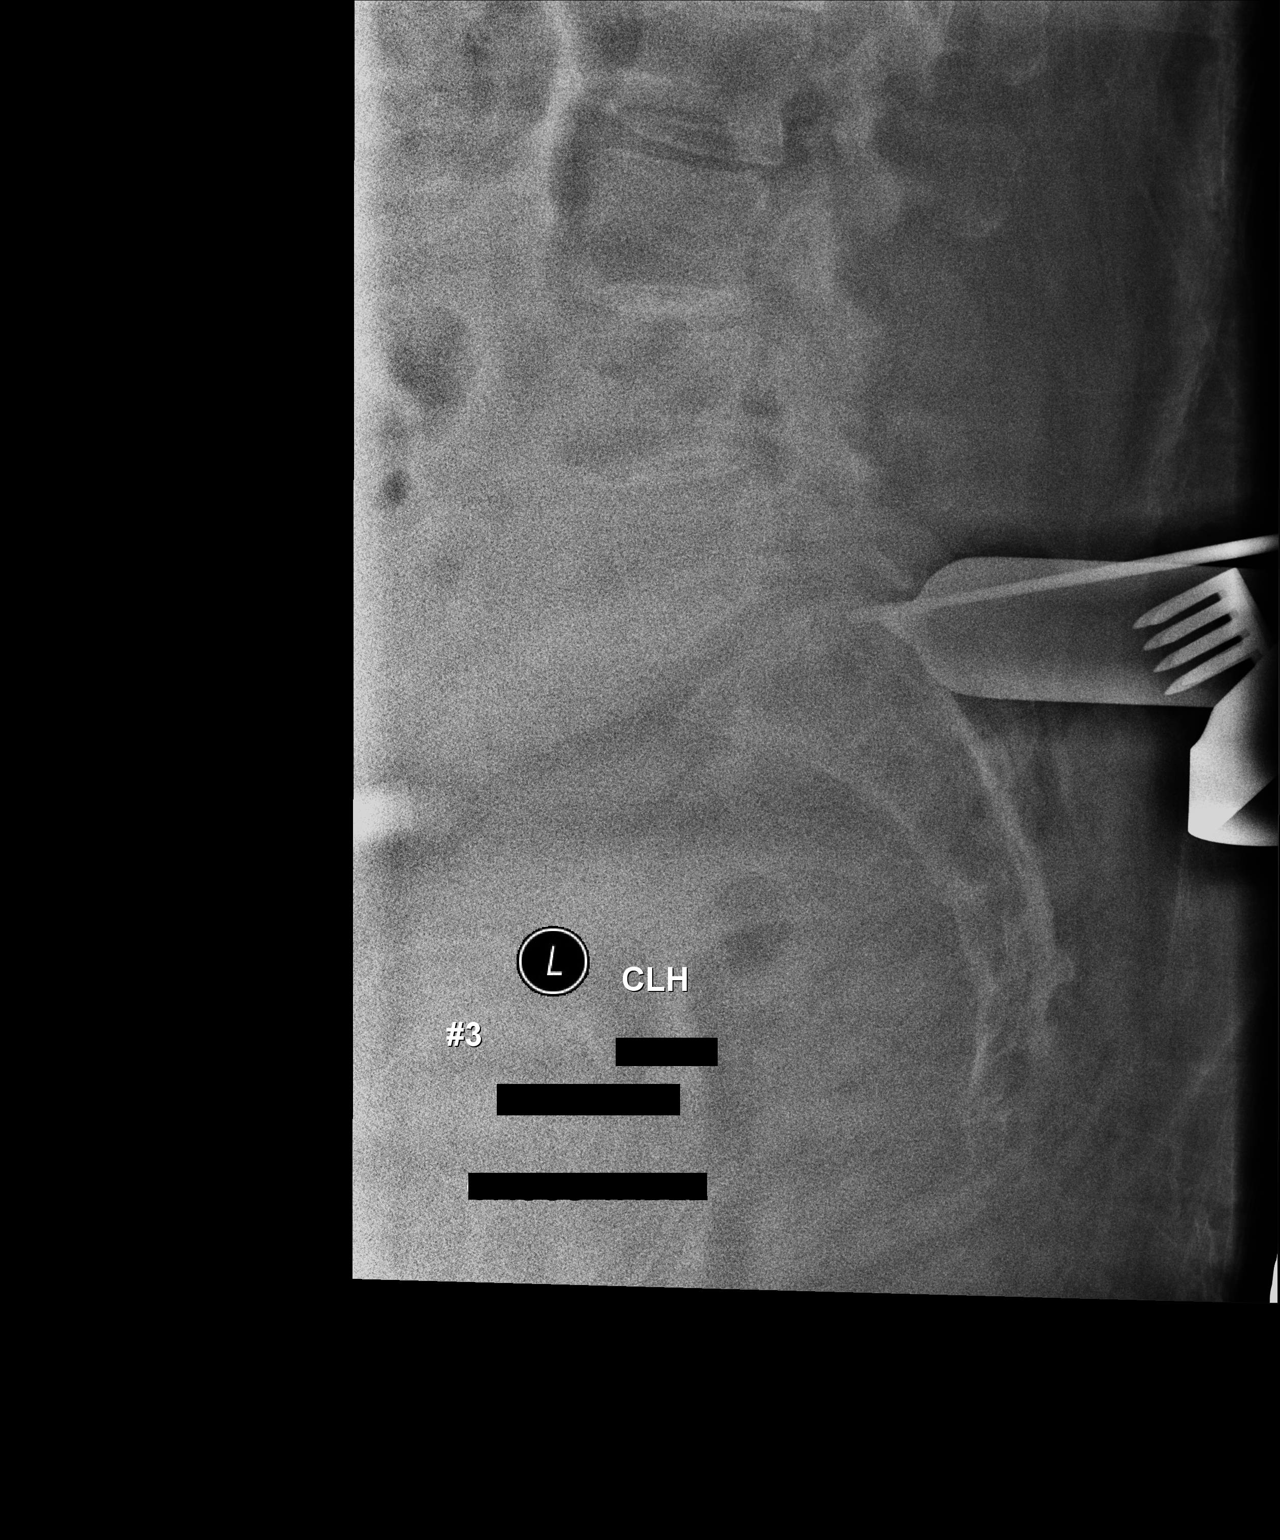

[3 of 3 positions shown; findings below may reference images not displayed]

FINDINGS: Metallic marker noted posteriorly at the level of the upper sacrum
on image number 3.
IMPRESSION: Metallic marker noted posteriorly along the upper portion of the
sacrum on image number 3 .

## 2018-06-03 ENCOUNTER — Telehealth: Payer: Self-pay | Admitting: *Deleted

## 2018-06-03 NOTE — Telephone Encounter (Signed)
LMVM for pt to return call to r/s appt  with amy lomax, NP for cpap since mm/np 06-22-18 on maternity leave.

## 2018-06-07 ENCOUNTER — Encounter: Payer: Self-pay | Admitting: *Deleted

## 2018-06-07 NOTE — Telephone Encounter (Signed)
Called pt and r/s appt to Debbora Presto NP for 06-09-18 at 1300.  Pt did not have laptop or smartphone.  Made TELEPHONE VISIT.  Chart updated.

## 2018-06-08 NOTE — Progress Notes (Signed)
PATIENT: [name] DOB: 29-Aug-1955  REASON FOR VISIT: follow up HISTORY FROM: patient  Virtual Visit via Telephone Note  I connected with [name] on 06/09/18 at  1:00 PM EDT by telephone and verified that I am speaking with the correct person using two identifiers.   I discussed the limitations, risks, security and privacy concerns of performing an evaluation and management service by telephone and the availability of in person appointments. I also discussed with the patient that there may be a patient responsible charge related to this service. The patient expressed understanding and agreed to proceed.   History of Present Illness:  06/09/18 [name] is a 63 y.o. female for follow up of memory loss and impaired gait. She has a history of DMT2,TIA, OSA on CPAP (noncompliant) and skizophrenia. She was educated on relationship of obesity and symptoms at last visit. Phentermine started for weight loss. She reports that she does not feel that her insurance would cover this medication.  She never picked up the prescription.  She states that she has tried portion control.  She has limited herself to 1 piece of meat per meal.  She still feels hungry but continues to monitor portions.  On 04/20/2018 she weighed 314. She was 334 at her last visit with Dr Jannifer Franklin in 12/2017.  She is not interested in bariatric surgery at this time.  She is interested in healthy weight loss options, however, she does not know where to start.  She is very skeptical of weight loss medications due to how they make her feel.  She is also concerned about possible medication interactions with medication she is taking daily.  She is on 175 mcg of Synthroid daily.  She is seeing her primary care provider regularly.   History (copied from Dr Jannifer Franklin' note on 12/22/2017)  [name] is a 63 year old right-handed black female with a history of morbid obesity and a history of schizophrenia.  The patient has  chronic low back pain, she has had lumbosacral spine surgery previously.  She is currently followed through [facility] for her pain control, she is on opiate medications currently.  The patient unfortunately sustained a fall while getting into the shower on 06 November 2017.  The patient had an avulsion fracture of the S1 vertebral body, she has been seen by Dr. Lorin Mercy, surgery was not recommended.  The patient has pain in the back and pain down both legs.  She continues to have some mild problems with memory, she is not sleeping well at night, she is noncompliant with her CPAP, she claims that she continues to forget to use it.  The patient has an Environmental consultant who comes into help her daily.  The patient now has pain at all times with standing, sitting, and with lying down.  She has lost about 10 pounds since last seen, we have emphasized that her medical issues are tied in with her weight, if she can lose weight she will have improved diabetic control, lower back pain, better balance, better sleep, and better memory.  She returns for an evaluation.  It is not clear that anyone has ever discussed the possibility of bariatric surgery with her.  In the past, the patient has had several episodes of altered mental status associated with polypharmacy, concurrent use of opiates and benzodiazepines.   Observations/Objective:  Generalized: Well developed, in no acute distress  Mentation: Alert oriented to time, place, history taking. Follows all commands speech and language fluent  Assessment and Plan:  63 y.o. year old female  has a past medical history of Anemia, Anxiety, Arthritis, Asthma, Benign paroxysmal positional vertigo (12/30/2012), Breast cancer ([place]) (02/13/12), Breast mass in female, Chronic lower back pain, CKD (chronic kidney disease), stage III ([place]), Daily headache, Depression, Dyspnea, Gait abnormality (05/29/2016), GERD (gastroesophageal reflux disease), History of blood transfusion  (2011), History of hiatal hernia, History of stomach ulcers, HSV (herpes simplex virus) infection, radiation therapy (05/05/12- 07/15/12), Hyperlipemia, Hypertension, Hypothyroidism, Kidney stones, Knee pain, bilateral, Memory difficulty (12/22/2017), Migraines, Obesity, OSA on CPAP, Pancreatitis, Personal history of radiation therapy (02/2012), Pneumonia (1950's), Polyneuropathy in diabetes(357.2), Schizophrenia ([place]), Secondary parkinsonism ([place]) (12/30/2012), Tachycardia, Thyroid disease, and Type II diabetes mellitus ([place]). here with    ICD-10-CM   1. Obesity, morbid, BMI 50 or higher ([place]) E66.01 Amb Ref to Medical Weight Management   [name] has made some progress in her weight loss journey.  She is trying to monitor her portion sizes.  After discussion of options for continued weight loss, she agrees to being referred to the healthy weight loss center.  We will place a referral to Dr. Leafy Ro today.  She was encouraged to continue healthy lifestyle changes with decreasing portion sizes and eating a well-balanced diet.  She was also encouraged to walk, although, this is very difficult for her due to chronic pain.  She was advised to reach out to [place] for any concerns of worsening memory.  She feels that this is stable and complicated by her pain management regimen.  We will follow-up with her in 1 year, sooner if needed.   Orders Placed This Encounter  Procedures  . Amb Ref to Medical Weight Management    Referral Priority:   Routine    Referral Type:   Consultation    Referred to Provider:   Starlyn Skeans, MD    Number of Visits Requested:   1    No orders of the defined types were placed in this encounter.    Follow Up Instructions:  I discussed the assessment and treatment plan with the patient. The patient was provided an opportunity to ask questions and all were answered. The patient agreed with the plan and demonstrated an understanding of the instructions.   The patient was  advised to call back or seek an in-person evaluation if the symptoms worsen or if the condition fails to improve as anticipated.  I provided 25 minutes of non-face-to-face time during this encounter.  Patient is located at her place of residence during teleconference.  Provider is located at her place of residence.  Liane Comber, RN helped to facilitate visit.   Debbora Presto, NP

## 2018-06-09 ENCOUNTER — Other Ambulatory Visit: Payer: Self-pay

## 2018-06-09 ENCOUNTER — Ambulatory Visit (INDEPENDENT_AMBULATORY_CARE_PROVIDER_SITE_OTHER): Payer: Medicare Other | Admitting: Family Medicine

## 2018-06-09 ENCOUNTER — Encounter: Payer: Self-pay | Admitting: Family Medicine

## 2018-06-09 DIAGNOSIS — R413 Other amnesia: Secondary | ICD-10-CM

## 2018-06-09 DIAGNOSIS — R269 Unspecified abnormalities of gait and mobility: Secondary | ICD-10-CM | POA: Diagnosis not present

## 2018-06-09 NOTE — Progress Notes (Signed)
I have read the note, and I agree with the clinical assessment and plan.  Amarilys Lyles K Rayven Rettig   

## 2018-06-14 ENCOUNTER — Other Ambulatory Visit: Payer: Self-pay | Admitting: Endocrinology

## 2018-06-22 ENCOUNTER — Ambulatory Visit: Payer: Medicaid Other | Admitting: Adult Health

## 2018-07-02 ENCOUNTER — Encounter: Payer: Self-pay | Admitting: Endocrinology

## 2018-07-02 ENCOUNTER — Other Ambulatory Visit: Payer: Self-pay | Admitting: Endocrinology

## 2018-07-05 ENCOUNTER — Ambulatory Visit (INDEPENDENT_AMBULATORY_CARE_PROVIDER_SITE_OTHER): Payer: Medicare Other | Admitting: Endocrinology

## 2018-07-05 ENCOUNTER — Other Ambulatory Visit: Payer: Self-pay

## 2018-07-05 DIAGNOSIS — E118 Type 2 diabetes mellitus with unspecified complications: Secondary | ICD-10-CM | POA: Diagnosis not present

## 2018-07-05 DIAGNOSIS — Z794 Long term (current) use of insulin: Secondary | ICD-10-CM | POA: Diagnosis not present

## 2018-07-05 NOTE — Progress Notes (Signed)
   Subjective:    Patient ID: [name], female    DOB: 1955-09-15, 63 y.o.   MRN: [id number]  HPI  telehealth visit today via phone x 6 minutes.  Alternatives to telehealth are presented to this patient, and the patient agrees to the telehealth visit. Pt is advised of the cost of the visit, and agrees to this, also.   Patient is at home, and I am at the office.   Persons attending the telehealth visit: the patient and I  Pt returns for f/u of postsurgical hypothyroidism (she had thyroidectomy in approx 1988, for a goiter; she says no cancer was found; she has been on prescribed thyroid hormone therapy since then; she has not recently had thyroid imaging; she does not know why TSH fluctuates, but med noncompliance has been noted; she takes synthroid as rx'ed; MRI (2018) showed normal orbits are normal, and no mention was made of the pituitary).  She takes synthroid as rx'ed.  She says she feels no different since synthroid was increased.   Pt also returns for f/u of diabetes mellitus:  DM type: Insulin-requiring type 2.   Dx'ed: 6789 Complications: renal insuff and TIA Therapy: insulin since 2015, and janumet GDM: never DKA: never Severe hypoglycemia: never.   Pancreatitis: never Pancreatic imaging: Fatty atrophy was noted on 2018 CT.  Other: she takes multiple daily injections Interval history: pt states cbg varies from 90-120, and that there is no trend throughout the day. Pt takes meds as rx'ed.  she was rx'ed prednisone 1-2 mos ago, but she stopped, due to side effect of anxiety.     Review of Systems She denies hypoglycemia.      Objective:   Physical Exam       Assessment & Plan:  Insulin-requiring type 2 DM, with renal insuff: apparently well-controlled Hypothyroidism: she declines labs for now.  Patient Instructions  Please continue the same medications.   Please come back for a follow-up appointment in 2 months.

## 2018-07-05 NOTE — Patient Instructions (Signed)
Please continue the same medications.   Please come back for a follow-up appointment in 2 months.

## 2018-07-17 ENCOUNTER — Other Ambulatory Visit: Payer: Self-pay | Admitting: Endocrinology

## 2018-07-20 ENCOUNTER — Ambulatory Visit: Payer: Self-pay

## 2018-08-03 ENCOUNTER — Ambulatory Visit (INDEPENDENT_AMBULATORY_CARE_PROVIDER_SITE_OTHER): Payer: Medicare Other | Admitting: Orthopaedic Surgery

## 2018-08-03 ENCOUNTER — Encounter: Payer: Self-pay | Admitting: Orthopaedic Surgery

## 2018-08-03 ENCOUNTER — Other Ambulatory Visit: Payer: Self-pay

## 2018-08-03 VITALS — Ht 65.0 in | Wt 340.0 lb

## 2018-08-03 DIAGNOSIS — G894 Chronic pain syndrome: Secondary | ICD-10-CM

## 2018-08-03 DIAGNOSIS — M545 Low back pain: Secondary | ICD-10-CM | POA: Diagnosis not present

## 2018-08-03 DIAGNOSIS — G8929 Other chronic pain: Secondary | ICD-10-CM | POA: Diagnosis not present
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
## 2018-08-09 ENCOUNTER — Other Ambulatory Visit: Payer: Self-pay

## 2018-08-09 MED ORDER — INSULIN PEN NEEDLE 31G X 6 MM MISC: 1.0000 | Freq: Four times a day (QID) | 1 refills | Status: DC

## 2018-08-24 ENCOUNTER — Other Ambulatory Visit: Payer: Self-pay | Admitting: Endocrinology

## 2018-08-28 ENCOUNTER — Other Ambulatory Visit: Payer: Self-pay | Admitting: Endocrinology

## 2018-08-31 ENCOUNTER — Other Ambulatory Visit: Payer: Self-pay

## 2018-09-01 ENCOUNTER — Ambulatory Visit
Admission: RE | Admit: 2018-09-01 | Discharge: 2018-09-01 | Disposition: A | Discharge status: Home / Self Care | Payer: Medicare Other | Source: Ambulatory Visit | Attending: Family Medicine | Admitting: Family Medicine

## 2018-09-01 DIAGNOSIS — Z1231 Encounter for screening mammogram for malignant neoplasm of breast: Secondary | ICD-10-CM

## 2018-09-02 ENCOUNTER — Other Ambulatory Visit: Payer: Self-pay

## 2018-09-02 ENCOUNTER — Ambulatory Visit (INDEPENDENT_AMBULATORY_CARE_PROVIDER_SITE_OTHER): Payer: Medicare Other | Admitting: Endocrinology

## 2018-09-02 ENCOUNTER — Encounter: Payer: Self-pay | Admitting: Endocrinology

## 2018-09-02 VITALS — BP 112/80 | HR 101 | Ht 65.0 in | Wt 353.2 lb

## 2018-09-02 DIAGNOSIS — E118 Type 2 diabetes mellitus with unspecified complications: Secondary | ICD-10-CM

## 2018-09-02 DIAGNOSIS — E890 Postprocedural hypothyroidism: Secondary | ICD-10-CM

## 2018-09-02 DIAGNOSIS — N181 Chronic kidney disease, stage 1: Secondary | ICD-10-CM

## 2018-09-02 DIAGNOSIS — Z794 Long term (current) use of insulin: Secondary | ICD-10-CM | POA: Diagnosis not present

## 2018-09-02 DIAGNOSIS — E1122 Type 2 diabetes mellitus with diabetic chronic kidney disease: Secondary | ICD-10-CM

## 2018-09-02 LAB — TSH: TSH: 1.04 u[IU]/mL (ref 0.35–4.50)

## 2018-09-02 LAB — POCT GLYCOSYLATED HEMOGLOBIN (HGB A1C): Hemoglobin A1C: 6.6 % — AB (ref 4.0–5.6)

## 2018-09-02 NOTE — Progress Notes (Signed)
Subjective:    Patient ID: [name], female    DOB: March 19, 1955, 64 y.o.   MRN: [id number]  HPI Pt returns for f/u of postsurgical hypothyroidism (she had thyroidectomy in approx 1988, for a goiter; she says no cancer was found; she has been on prescribed thyroid hormone therapy since then; she has not recently had thyroid imaging; she does not know why TSH fluctuates, but med noncompliance has been noted; she takes synthroid as rx'ed; MRI (2018) showed normal orbits, and no mention was made of the pituitary).  She takes synthroid as rx'ed.  Since synthroid was increased, she still has fatigue.   Pt also returns for f/u of diabetes mellitus:  DM type: Insulin-requiring type 2.   Dx'ed: 2015.  Complications: renal insuff and TIA.  Therapy: insulin since 2015, and Janumet.   GDM: never.  DKA: never Severe hypoglycemia: never.   Pancreatitis: never Pancreatic imaging: Fatty atrophy was noted on 2018 CT.   Other: she takes multiple daily injections.  Interval history: Meter is downloaded today, and the printout is scanned into the record.  She checks fasting only.  cbg varies from 110-190. Pt takes meds as rx'ed.  she was rx'ed prednisone 1-2 mos ago, but she stopped, due to side effect of anxiety.   Past Medical History:  Diagnosis Date  . Anemia   . Anxiety   . Arthritis    "pretty much all my joints"  . Asthma   . Benign paroxysmal positional vertigo 12/30/2012  . 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  . Breast mass in female    L breast 2008, US showed likely fat necrosis  . Chronic lower back pain   . CKD (chronic kidney disease), stage III ([place])    "lower stage" (01/06/2014)  . Daily headache    last time was 09/21/16 (brother passed away)  . Depression   . Dyspnea   . Gait abnormality 05/29/2016  . GERD (gastroesophageal reflux disease)   . History of blood transfusion 2011   "after one of my OR's"  . History  of hiatal hernia   . History of stomach ulcers   . HSV (herpes simplex virus) infection   . Hx of radiation therapy 05/05/12- 07/15/12   right breast, 45 gray x 25 fx, lumpectomy cavity boosted to 16.2 gray  . Hyperlipemia   . Hypertension    sees Dr. Criss Rosales , [facility]  . Hypothyroidism   . Kidney stones   . Knee pain, bilateral   . Memory difficulty 12/22/2017  . Migraines    "~ 3 times/month" (01/06/2014)  . Obesity   . OSA on CPAP    pt does not know settings  . Pancreatitis    hx of  . Personal history of radiation therapy 02/2012  . Pneumonia 1950's  . Polyneuropathy in diabetes(357.2)   . Schizophrenia ([facility])   . 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 Tachycardia    with sx in 2008  . Thyroid disease   . Type II diabetes mellitus ([facility])    dx 2014    Past Surgical History:  Procedure Laterality Date  . ABDOMINAL HYSTERECTOMY  1979?   partial  . benign cyst  Left    removed from left breast  . BREAST BIOPSY Right 02/13/2012  . BREAST LUMPECTOMY  03/03/2012   Procedure: LUMPECTOMY;  Surgeon: Haywood Lasso, MD;  Location: [facility];  Service: General;  Laterality:  Right;  [name] BREAST SURGERY Right 01/2012   "cancer"  . CHOLECYSTECTOMY  1980's?  . COLONOSCOPY WITH PROPOFOL N/A 09/28/2012   Procedure: COLONOSCOPY WITH PROPOFOL;  Surgeon: Lear Ng, MD;  [facility];  Service: Endoscopy;  Laterality: N/A;  . FOOT FRACTURE SURGERY Right 1990's  . JOINT REPLACEMENT      x 3  . LUMBAR LAMINECTOMY/DECOMPRESSION MICRODISCECTOMY N/A 12/05/2016   Procedure: LEFT L5-S1 MICRODISCECTOMY;  Surgeon: Marybelle Killings, MD;  Location: [facility];  Service: Orthopedics;  Laterality: N/A;  . REVISION TOTAL KNEE ARTHROPLASTY  07/2009  . SHOULDER OPEN ROTATOR CUFF REPAIR Left 1990's  . THYROIDECTOMY  1970's  . TOTAL KNEE ARTHROPLASTY Bilateral 2009-06/2009   left; right    Social History   Socioeconomic History  . Marital status: Divorced    Spouse name: Not on  file  . Number of children: 2  . Years of education: 76  . Highest education level: Not on file  Occupational History  . Occupation: Disabled  Social Needs  . Financial resource strain: Not on file  . Food insecurity    Worry: Not on file    Inability: Not on file  . Transportation needs    Medical: Not on file    Non-medical: Not on file  Tobacco Use  . Smoking status: Former Smoker    Packs/day: 1.00    Years: 7.00    Pack years: 7.00    Types: Cigarettes    Quit date: 03/24/1992    Years since quitting: 26.4  . Smokeless tobacco: Never Used  Substance and Sexual Activity  . 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 Drug use: No  . Sexual activity: Never    Birth control/protection: Surgical  Lifestyle  . Physical activity    Days per week: Not on file    Minutes per session: Not on file  . Stress: Not on file  Relationships  . Social Herbalist on phone: Not on file    Gets together: Not on file    Attends religious service: Not on file    Active member of club or organization: Not on file    Attends meetings of clubs or organizations: Not on file    Relationship status: Not on file  . Intimate partner violence    Fear of current or ex partner: Not on file    Emotionally abused: Not on file    Physically abused: Not on file    Forced sexual activity: Not on file  Other Topics Concern  . Not on file  Social History Narrative   Single. Disabled Biochemist, clinical.          Patient is right handed.    Denies caffeine use     Current Outpatient Medications on File Prior to Visit  Medication Sig Dispense Refill  . albuterol (PROAIR HFA) 108 (90 Base) MCG/ACT inhaler Inhale 2 puffs into the lungs every 4 (four) hours as needed for wheezing or shortness of breath.     [name] aspirin EC 81 MG tablet Take 1 tablet (81 mg total) by mouth daily. 30 tablet 0  . B Complex-C (B-COMPLEX WITH VITAMIN C) tablet Take 1 tablet by mouth  daily.    . budesonide-formoterol (SYMBICORT) 80-4.5 MCG/ACT inhaler Inhale 2 puffs into the lungs 2 (two) times daily. (Patient taking differently: Inhale 1 puff into the lungs 3 (three) times daily. PRN) 1 Inhaler  11  . calcitRIOL (ROCALTROL) 0.25 MCG capsule TAKE ONE CAPSULE BY MOUTH THREE TIMES DAILY WEEKLY 13 capsule 2  . Cholecalciferol (VITAMIN D-3) 1000 units CAPS Take 1,000 Units by mouth daily.    . Cyanocobalamin (VITAMIN B-12 PO) Take 1 tablet by mouth daily.    . diphenhydrAMINE (BENADRYL) 25 MG tablet Take 50 mg by mouth every 6 (six) hours as needed for itching or allergies.    [name] doxepin (SINEQUAN) 25 MG capsule Take 50 mg by mouth at bedtime.    [name] esomeprazole (NEXIUM) 40 MG capsule Take 1 capsule (40 mg total) by mouth daily before breakfast. (Patient taking differently: Take 40 mg by mouth daily at 12 noon. ) 30 capsule 1  . furosemide (LASIX) 40 MG tablet Take 1 tablet (40 mg total) by mouth daily. 30 tablet 0  . glucose blood test strip TEST BLOOD SUGAR THREE TIMES DAILY 100 each 11  . HUMALOG KWIKPEN 100 UNIT/ML KwikPen INJECT 10 UNITS THREE TIMES DAILY 15 mL 4  . Insulin Pen Needle 31G X 6 MM MISC 1 each by Does not apply route 4 (four) times daily. Use to inject insulin 4 times per day 200 each 1  . Iron-Vitamins (GERITOL PO) Take 1 tablet by mouth daily.    [name] JANUMET 50-500 MG tablet Take 1 tablet by mouth 2 (two) times daily with a meal. 60 tablet 1  . LEVEMIR FLEXTOUCH 100 UNIT/ML Pen Inject 10 Units into the skin at bedtime. 15 mL 4  . levothyroxine (SYNTHROID, LEVOTHROID) 175 MCG tablet Take 1 tablet (175 mcg total) by mouth daily before breakfast. 90 tablet 3  . linaclotide (LINZESS) 290 MCG CAPS capsule Take 290 mcg by mouth daily before breakfast.    . LORazepam (ATIVAN) 0.5 MG tablet Take 0.5 mg by mouth 3 (three) times daily.    [name] losartan-hydrochlorothiazide (HYZAAR) 100-12.5 MG tablet Take 1 tablet by mouth daily.    . methocarbamol (ROBAXIN) 500 MG tablet Take 1  tablet (500 mg total) by mouth every 8 (eight) hours as needed for muscle spasms. 30 tablet 0  . Multiple Vitamin (MULTIVITAMIN WITH MINERALS) TABS tablet Take 1 tablet by mouth daily.    [name] oxyCODONE (ROXICODONE) 15 MG immediate release tablet 15 mg every 6 (six) hours as needed.     . phentermine 37.5 MG capsule Take 1 capsule (37.5 mg total) by mouth every morning. 30 capsule 3  . pregabalin (LYRICA) 300 MG capsule Take 300 mg by mouth 2 (two) times daily.    . rosuvastatin (CRESTOR) 20 MG tablet Take 20 mg by mouth daily.    [name] tiZANidine (ZANAFLEX) 4 MG tablet Take 4 mg by mouth 2 (two) times daily.     . valACYclovir (VALTREX) 1000 MG tablet Take 1,000 mg by mouth 2 (two) times daily.     [name] venlafaxine XR (EFFEXOR-XR) 150 MG 24 hr capsule Take 150 mg by mouth 2 (two) times daily.    . ziprasidone (GEODON) 60 MG capsule Take 60 mg by mouth daily.     No current facility-administered medications on file prior to visit.     Allergies  Allergen Reactions  . Bee Venom Anaphylaxis  . Shellfish Allergy Anaphylaxis  . Buprenorphine Hcl Other (See Comments)    Overly sedated with morphine drip.  . Other Other (See Comments)    Cats - bad asthma, stopped up    Family History  Problem Relation Age of Onset  . Heart disease Brother  Multiple MIs, starting in his 34s  . Diabetes Brother   . Thyroid disease Brother   . Hypertension Mother   . Diabetes Mother   . Breast cancer Mother 62  . Bone cancer Mother   . Hypertension Sister   . Diabetes Sister   . Breast cancer Sister 28  . Thyroid disease Sister   . Breast cancer Maternal Grandmother   . Heart disease Maternal Grandmother   . Uterine cancer Other 19  . Breast cancer Paternal Aunt 67  . Breast cancer Paternal Grandmother        dx in her 62s  . Prostate cancer Paternal Grandfather   . Asthma Son     BP 112/80 (BP Location: Left Wrist, Patient Position: Sitting, Cuff Size: Large)   Pulse (!) 101   Ht 5' 5" (1.651  m)   Wt (!) 353 lb 3.2 oz (160.2 kg)   SpO2 91%   BMI 58.78 kg/m    Review of Systems She denies hypoglycemia.      Objective:   Physical Exam VITAL SIGNS:  See vs page GENERAL: no distress Pulses: dorsalis pedis intact bilat.   MSK: no deformity of the feet CV: trace bilat leg edema Skin:  no ulcer on the feet.  normal color and temp on the feet. Neuro: sensation is intact to touch on the feet, but decrased on the right foot (pt says due to bony fx).      Lab Results  Component Value Date   HGBA1C 6.6 (A) 09/02/2018   Lab Results  Component Value Date   CREATININE 1.29 (H) 08/04/2017   BUN 20 08/04/2017   NA 137 08/04/2017   K 4.8 08/04/2017   CL 95 (L) 08/04/2017   CO2 25 08/04/2017   Lab Results  Component Value Date   TSH 1.04 09/02/2018      Assessment & Plan:  Hypothyroidism: well-replaced Insulin-requiring type 2 DM, with TIA: well-controlled Renal insuff: in this setting, she needs mostly mealtime insulin.  Patient Instructions  Please continue the same medications.   check your blood sugar twice a day.  vary the time of day when you check, between before the 3 meals, and at bedtime.  also check if you have symptoms of your blood sugar being too high or too low.  please keep a record of the readings and bring it to your next appointment here (or you can bring the meter itself).  You can write it on any piece of paper.  please call us sooner if your blood sugar goes below 70, or if you have a lot of readings over 200. Blood tests are requested for you today.  We'll let you know about the results.  Please come back for a follow-up appointment in 2-3 months.

## 2018-09-02 NOTE — Patient Instructions (Addendum)
Please continue the same medications.   check your blood sugar twice a day.  vary the time of day when you check, between before the 3 meals, and at bedtime.  also check if you have symptoms of your blood sugar being too high or too low.  please keep a record of the readings and bring it to your next appointment here (or you can bring the meter itself).  You can write it on any piece of paper.  please call us sooner if your blood sugar goes below 70, or if you have a lot of readings over 200. Blood tests are requested for you today.  We'll let you know about the results.  Please come back for a follow-up appointment in 2-3 months.

## 2018-09-27 ENCOUNTER — Other Ambulatory Visit: Payer: Self-pay | Admitting: Endocrinology

## 2018-09-28 ENCOUNTER — Other Ambulatory Visit: Payer: Self-pay | Admitting: Endocrinology

## 2018-10-13 IMAGING — MR MR LUMBAR SPINE WO/W CM
4 of 7 series · 24 of 48 positions shown · IV contrast (multihance)
Comparison: Radiographs 12/05/2016, abdominal CT 04/02/2016 and
lumbar MRI 03/01/2016.

CLINICAL DATA: Chronic diffuse back pain for years. Pain radiates
to both legs. Back surgery in 2765 without relief.

Creatinine was obtained on site at [HOSPITAL] at [HOSPITAL].
Results: Creatinine 1.2 mg/dL.
EXAM:
MRI LUMBAR SPINE WITHOUT AND WITH CONTRAST
TECHNIQUE: Multiplanar and multiecho pulse sequences of the lumbar spine were
obtained without and with intravenous contrast.
CONTRAST:  20mL MULTIHANCE GADOBENATE DIMEGLUMINE 529 MG/ML IV SOLN

[Series 3: T2 · sagittal · 4.0mm · 0.55mm/px · 5 of 15 slices shown (1 of 2)]
[im 1/15]
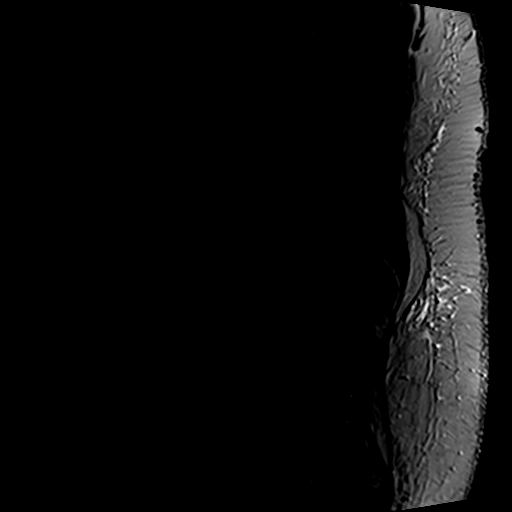
[im 4/15]
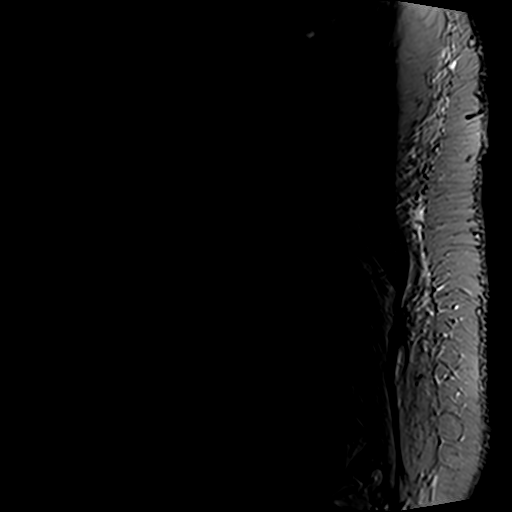
[im 8/15]
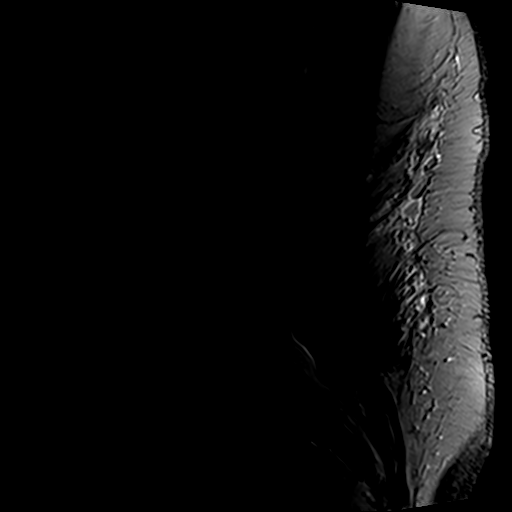
[im 11/15]
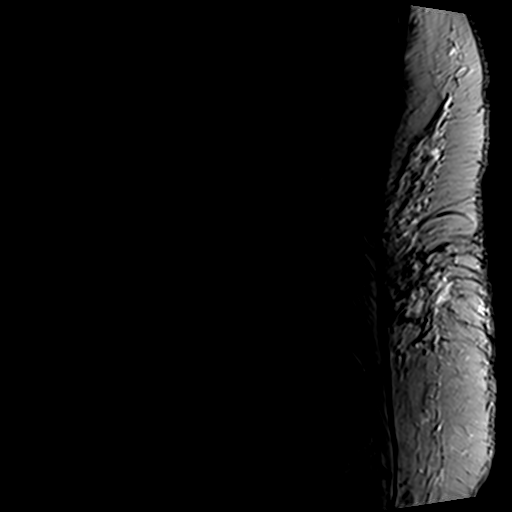
[im 15/15]
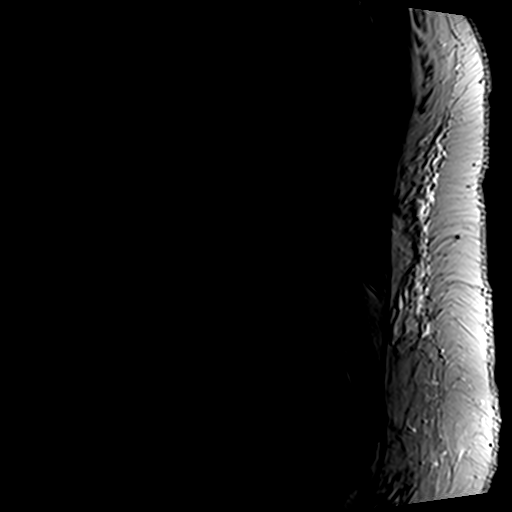

[Series 4: T1 · sagittal · 4.0mm · 0.55mm/px · 5 of 15 slices shown (1 of 2)]
[im 1/15]
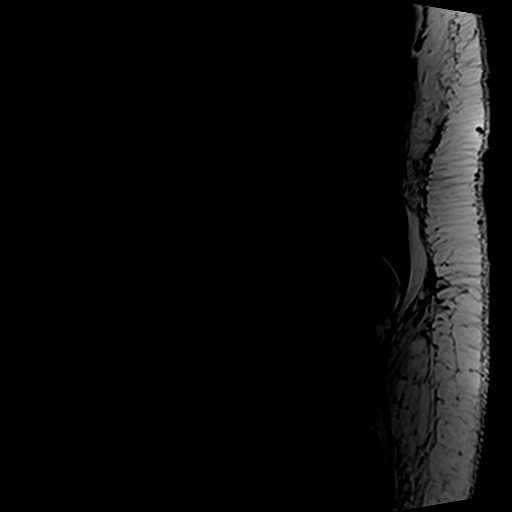
[im 4/15]
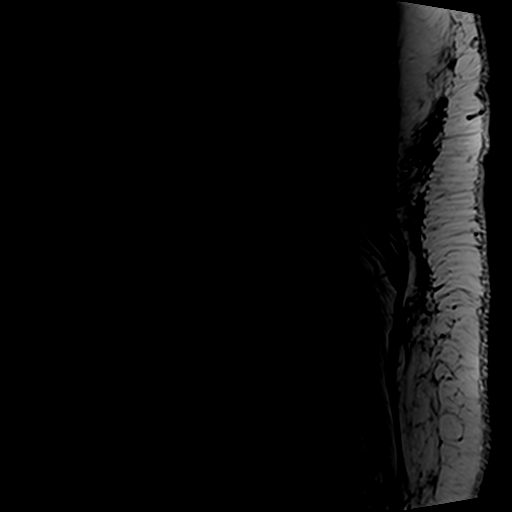
[im 8/15]
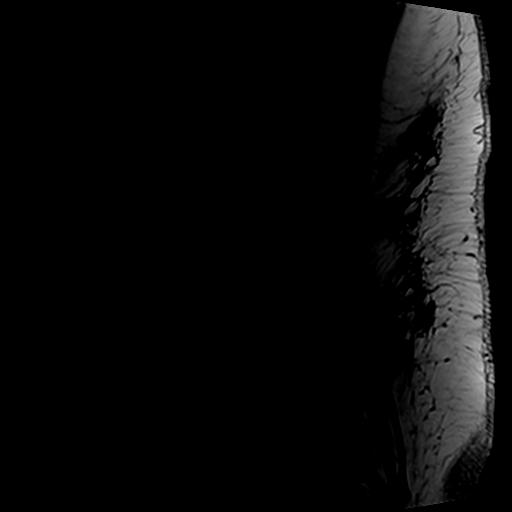
[im 11/15]
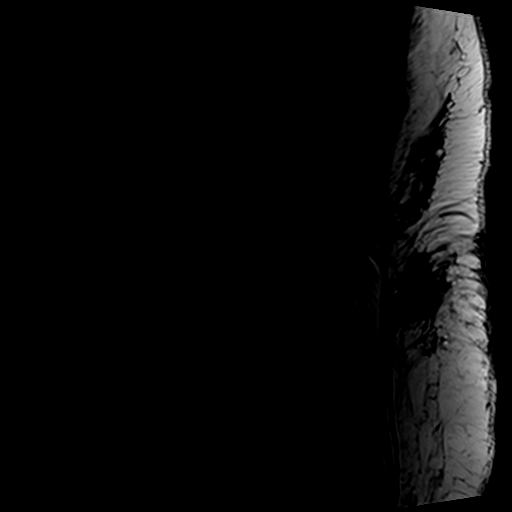
[im 15/15]
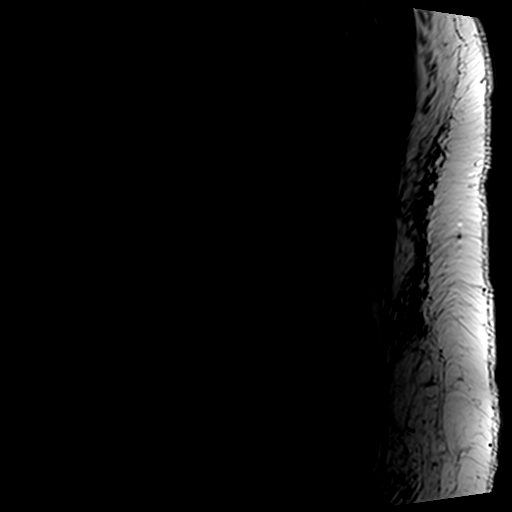

[Series 7: T1 · axial · 4.0mm · 0.35mm/px · z∈[-37,+146]mm · 6 of 37 slices shown (2 of 2)]
[im 1/37]
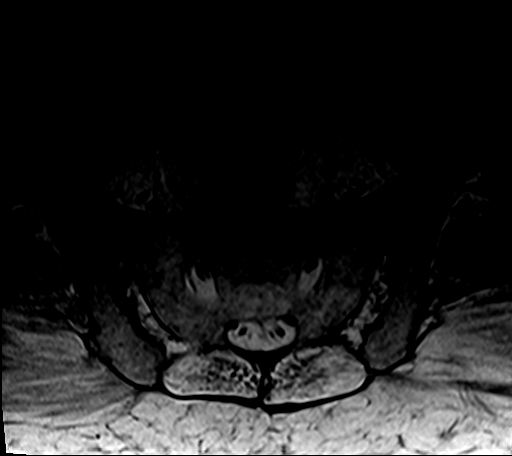
[im 5/37]
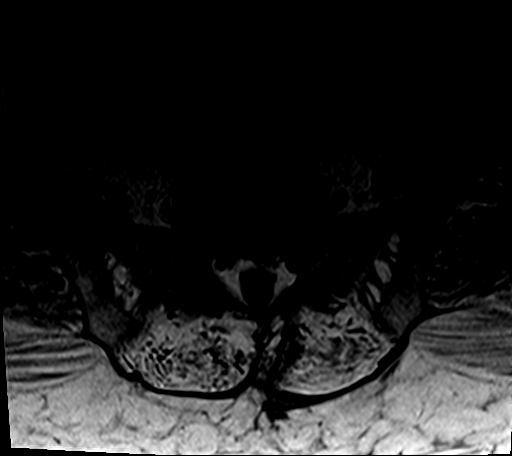
[im 13/37]
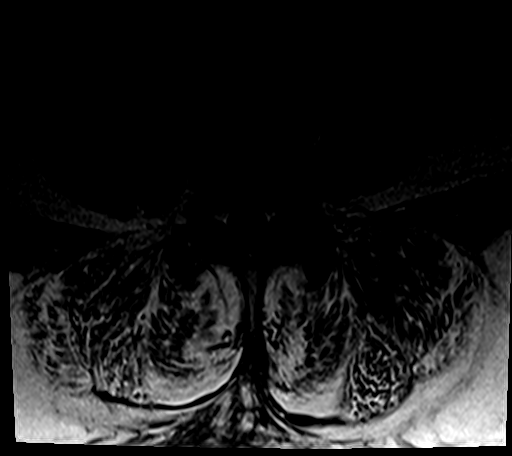
[im 17/37]
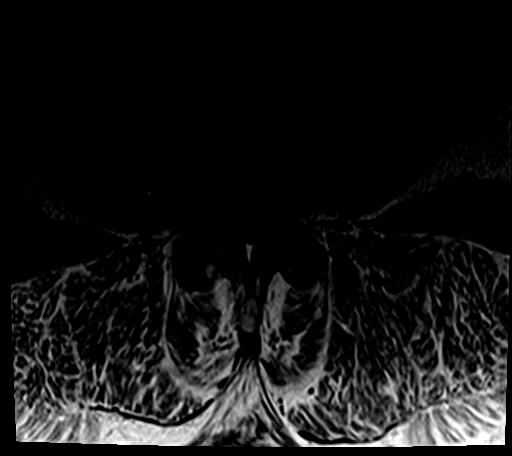
[im 21/37]
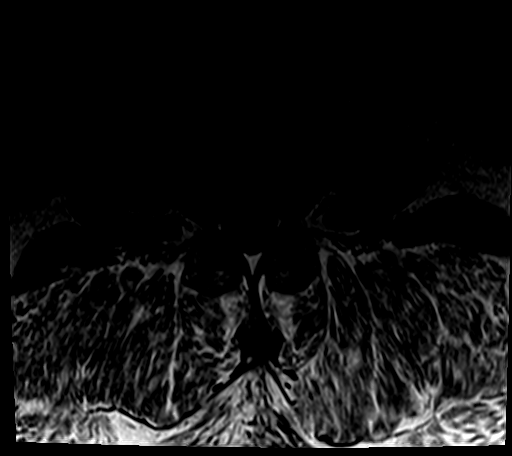
[im 33/37]
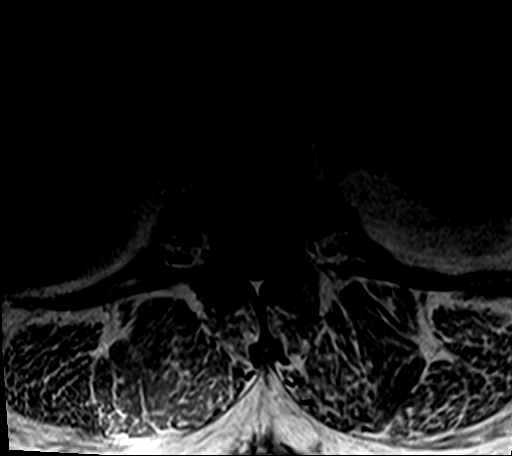

[Series 8: T2 · axial · 4.0mm · 0.70mm/px · z∈[-37,+167]mm · 8 of 37 slices shown (2 of 2)]
[im 1/37]
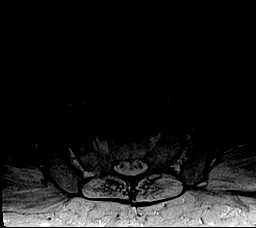
[im 5/37]
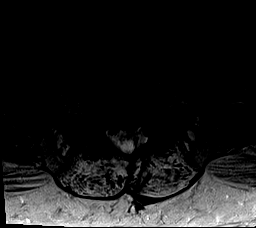
[im 13/37]
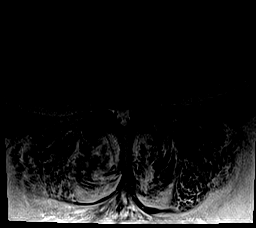
[im 17/37]
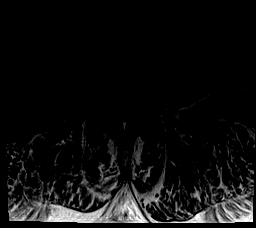
[im 21/37]
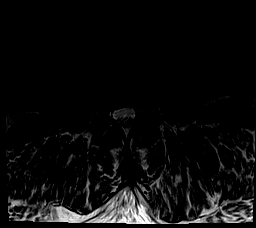
[im 25/37]
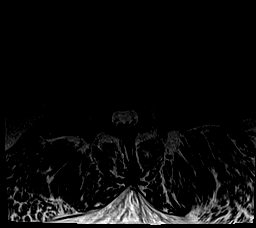
[im 33/37]
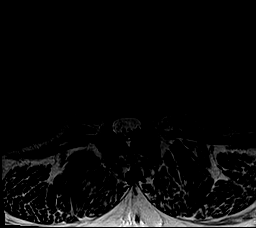
[im 37/37]
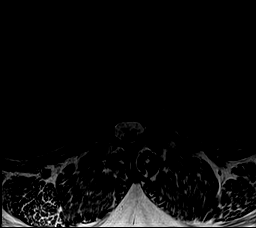

[24 of 48 positions shown; findings below may reference images not displayed]

FINDINGS: Segmentation: Conventional anatomy assumed, with the last open disc
space designated L5-S1.

Alignment: Mild convex-right scoliosis. Stable compared with
previous study.

Vertebrae: No worrisome osseous lesion, acute fracture or pars
defect. There is multilevel facet disease. The lumbar pedicles are
somewhat short on a congenital basis. The visualized sacroiliac
joints appear unremarkable.

Conus medullaris: Extends to the T12-L1 level and appears normal. No
abnormal intradural enhancement, although possible mild clumping of
the nerve roots in the distal thecal sac.

Paraspinal and other soft tissues: No significant paraspinal
findings.

Disc levels:

No significant disc space findings from T11-12 through L1-2.

L2-3: Annular disc bulging mildly eccentric to the right with mild
facet and ligamentous hypertrophy. No significant spinal stenosis or
nerve root encroachment.

L3-4: Progressive annular disc bulging, facet and ligamentous
hypertrophy resulting in severe spinal stenosis. There is moderate
lateral recess and foraminal narrowing bilaterally which has
progressed.

L4-5: Mildly progressive annular disc bulging with advanced facet
and ligamentous hypertrophy. The resulting moderate multifactorial
spinal stenosis and asymmetric narrowing of the left lateral recess
have not significantly changed. There is left-greater-than-right
foraminal narrowing which appears slightly worse.

L5-S1: Interval left laminectomy and discectomy. Left paracentral
disc protrusion has mildly enlarged with resulting mild mass effect
on the thecal sac and mild posterior displacement of the left S1
nerve root. There is some enhancement surrounding the S1 nerve
roots. Bilateral facet hypertrophy and mild left-greater-than-right
foraminal narrowing appears unchanged.
IMPRESSION: 1. Interval surgery on the left at L5-S1. Left paracentral disc
protrusion has mildly enlarged with mild posterior displacement of
the left S1 nerve root.
2. Mildly progressive left-greater-than-right foraminal narrowing at
L4-5 secondary to annular disc bulging, facet and ligamentous
hypertrophy. The associated moderate multifactorial spinal stenosis
at that level is unchanged.
3. Progressive multifactorial spinal stenosis at L3-4, now severe,
with progressive narrowing of the lateral recesses and foramina
bilaterally. Possible bilateral L3 nerve root encroachment.

## 2018-11-08 ENCOUNTER — Other Ambulatory Visit: Payer: Self-pay

## 2018-11-08 DIAGNOSIS — E118 Type 2 diabetes mellitus with unspecified complications: Secondary | ICD-10-CM

## 2018-11-08 MED ORDER — ONETOUCH DELICA LANCETS 30G MISC: 1.0000 | Freq: Three times a day (TID) | 0 refills | Status: DC

## 2018-11-23 IMAGING — MG DIGITAL DIAGNOSTIC BILATERAL MAMMOGRAM WITH TOMO AND CAD
6 of 11 series · 6 of 31 positions shown · non-contrast
Comparison: Previous exam(s).

ACR Breast Density Category a: The breast tissue is almost entirely
fatty.

CLINICAL DATA: 62-year-old patient due for annual examination.
History of right breast cancer diagnosed in 9061, status post right
breast lumpectomy in 6345. The images are to the best of the
patient's ability. She has limited mobility.

EXAM:
DIGITAL DIAGNOSTIC BILATERAL MAMMOGRAM WITH CAD AND TOMO

[R MLO]
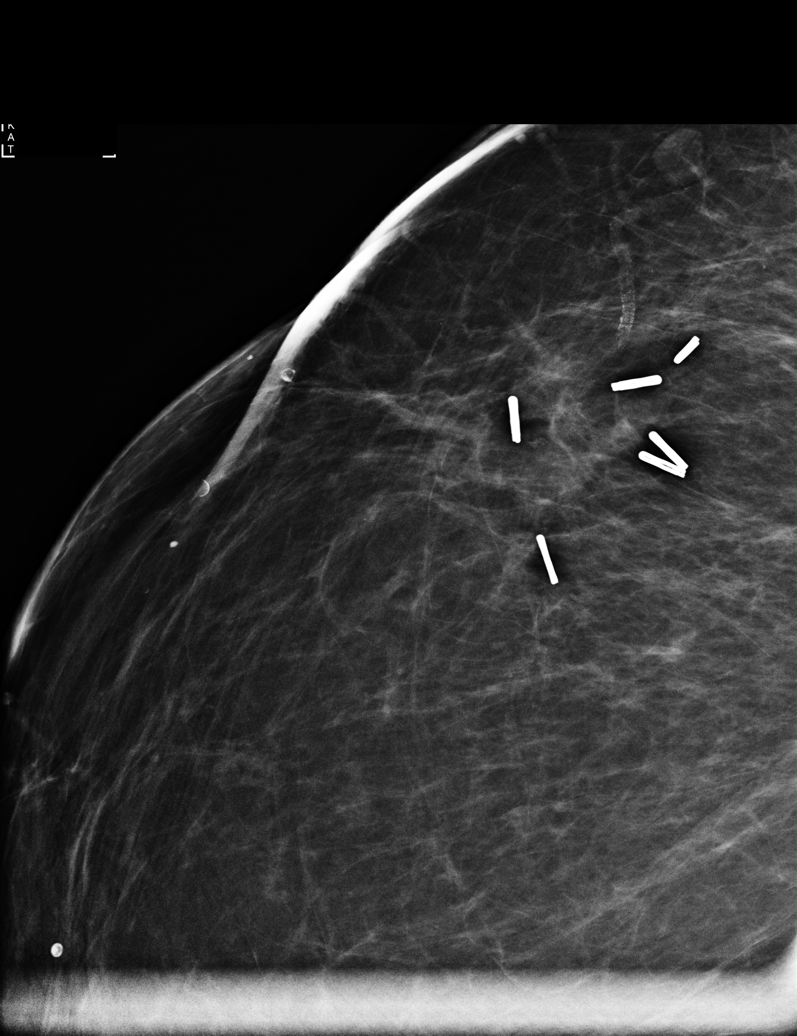

[R CC synth-2D]
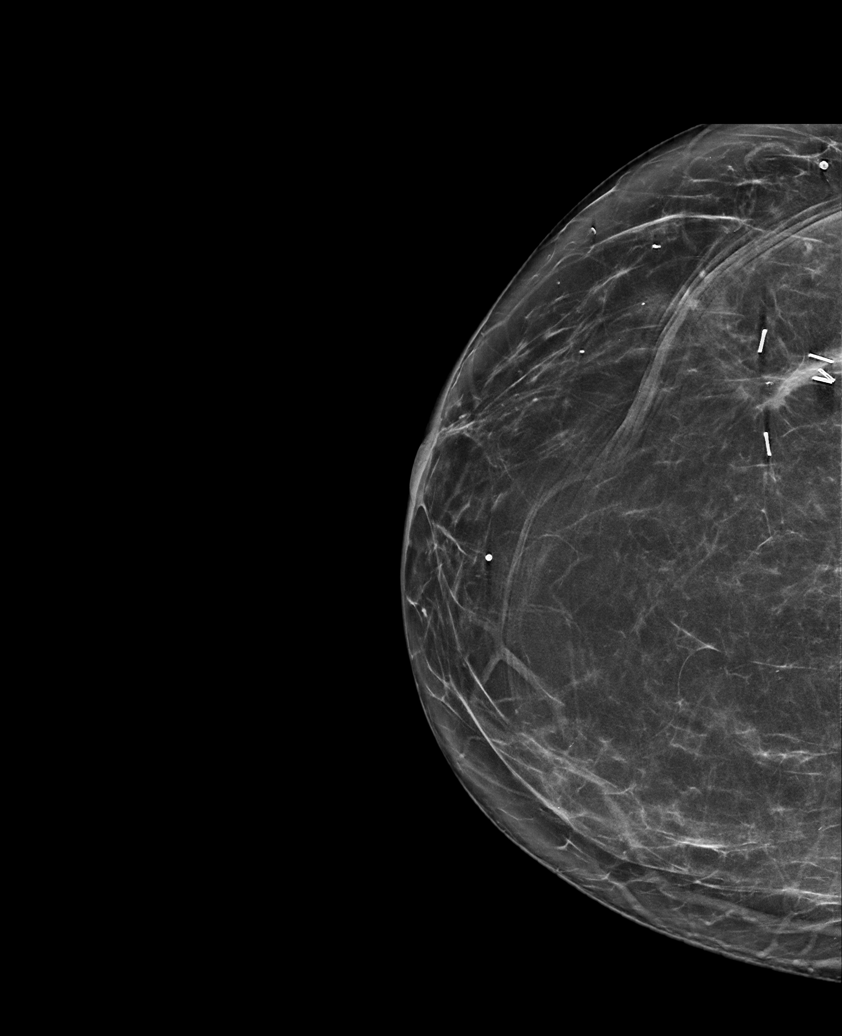

[L MLO synth-2D]
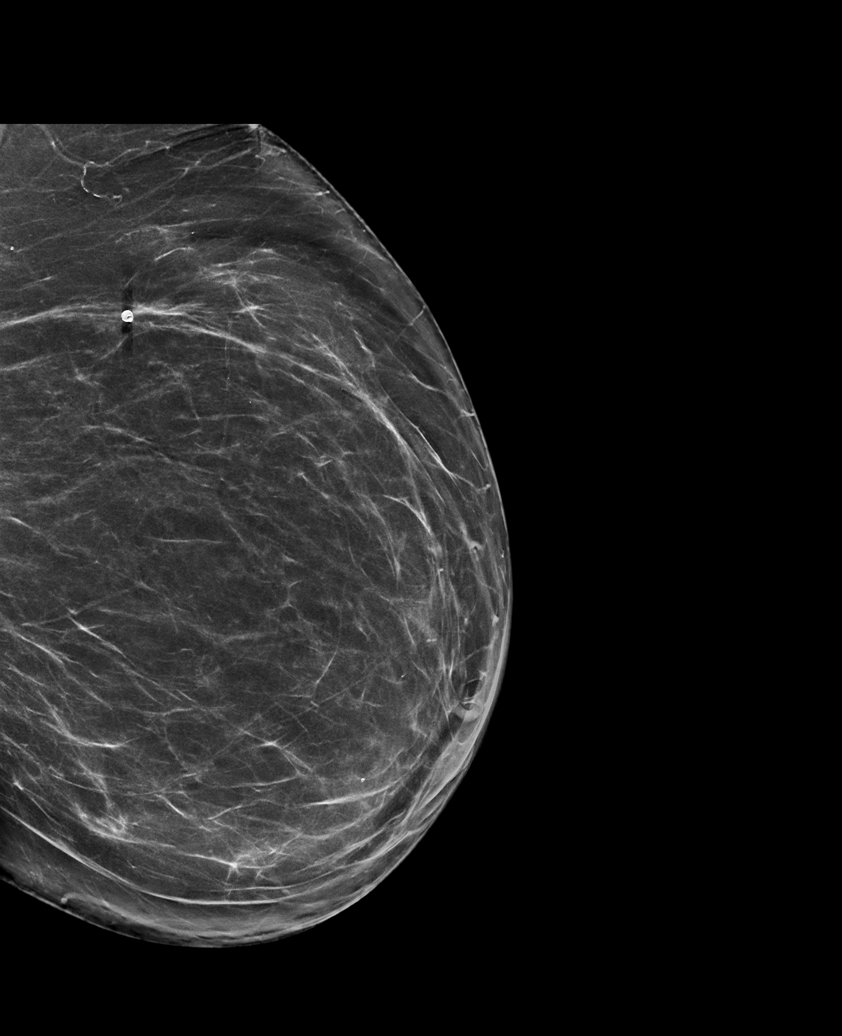

[R MLO synth-2D (1 of 2)]
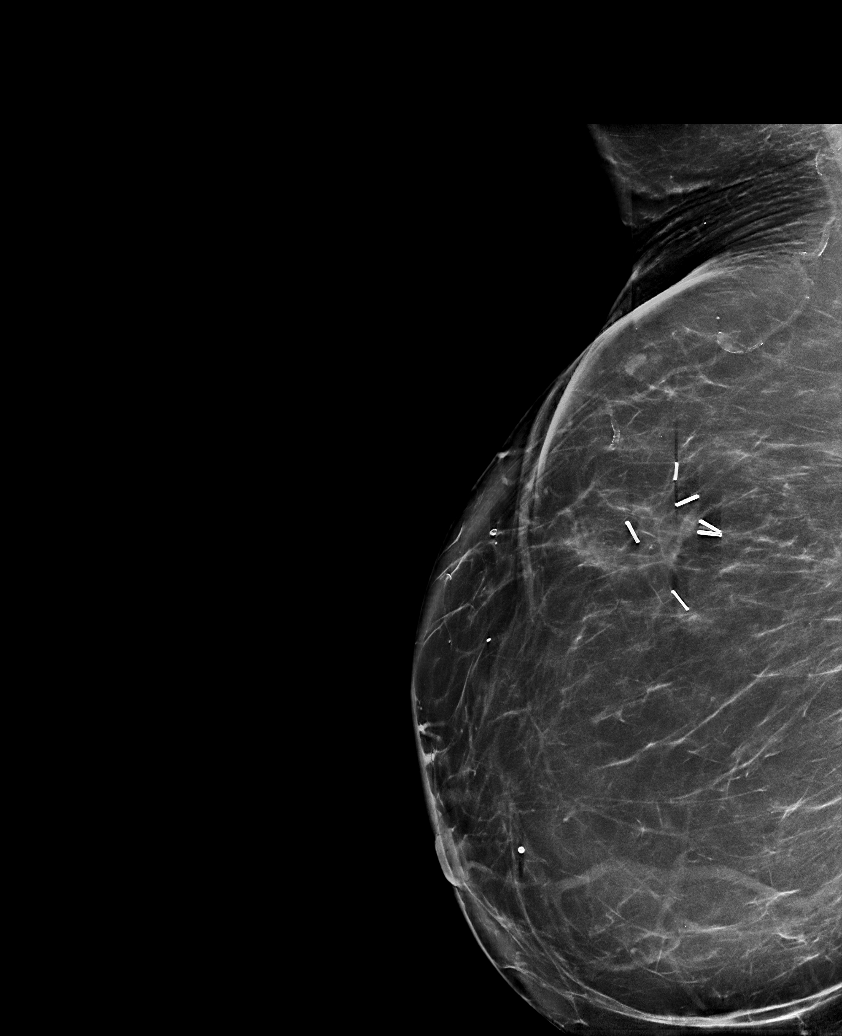

[L CC synth-2D]
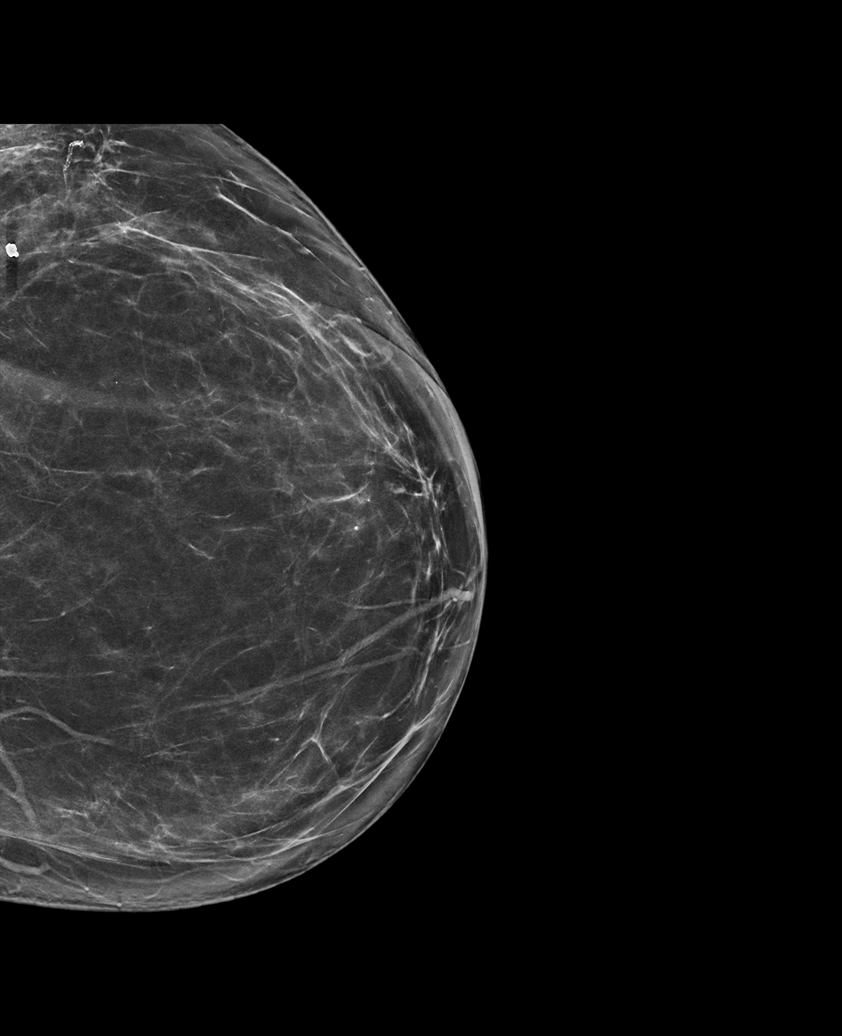

[R MLO synth-2D (2 of 2)]
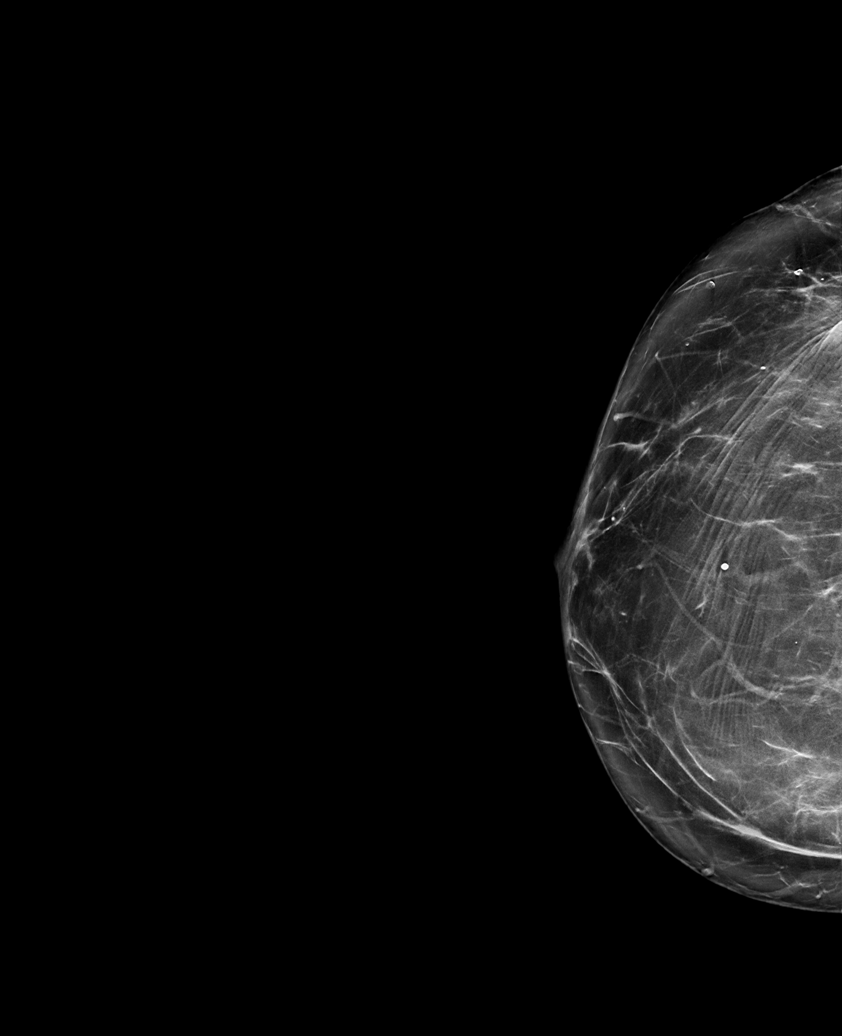

[6 of 31 positions shown; findings below may reference images not displayed]

FINDINGS: There are lumpectomy changes in the deep outer right breast. No
mass, nonsurgical distortion, or suspicious microcalcification is
identified in either breast to suggest malignancy. Some of the
posterior breast tissue is excluded bilaterally due to patient's
limited mobility.

Mammographic images were processed with CAD.
IMPRESSION: No evidence of malignancy in either breast. Lumpectomy changes on
the right.

RECOMMENDATION:
Screening mammogram in one year.(Code:DR-[id number])

I have discussed the findings and recommendations with the patient.
Results were also provided in writing at the conclusion of the
visit. If applicable, a reminder letter will be sent to the patient
regarding the next appointment.

BI-RADS CATEGORY  2: Benign.

## 2018-11-24 ENCOUNTER — Other Ambulatory Visit: Payer: Self-pay | Admitting: Endocrinology

## 2018-11-29 ENCOUNTER — Other Ambulatory Visit: Payer: Self-pay | Admitting: Endocrinology

## 2018-12-01 ENCOUNTER — Other Ambulatory Visit: Payer: Self-pay

## 2018-12-03 ENCOUNTER — Ambulatory Visit: Payer: Medicare Other | Admitting: Endocrinology

## 2018-12-08 ENCOUNTER — Other Ambulatory Visit: Payer: Self-pay

## 2018-12-08 ENCOUNTER — Telehealth: Payer: Self-pay

## 2018-12-08 MED ORDER — CALCITRIOL 0.25 MCG PO CAPS: 0.2500 ug | ORAL_CAPSULE | ORAL | 0 refills | Status: DC

## 2018-12-08 NOTE — Telephone Encounter (Signed)
I corrected and resent x 1 Ov is due

## 2018-12-08 NOTE — Addendum Note (Signed)
Addended by: Renato Shin on: 12/08/2018 12:30 PM   Modules accepted: Orders

## 2018-12-08 NOTE — Telephone Encounter (Signed)
Patient was scheduled on 12/03/18 for an appointment on 12/17/18 at 2:00 p.m.

## 2018-12-08 NOTE — Telephone Encounter (Signed)
Please call pt to schedule f/u appt per Dr. Cordelia Pen request

## 2018-12-08 NOTE — Telephone Encounter (Signed)
Received notification from [facility] asking for clarification of Calcitrol frequency. Is it TID or weekly? Routing this message to Dr. Loanne Drilling for clarity.

## 2018-12-15 ENCOUNTER — Other Ambulatory Visit: Payer: Self-pay

## 2018-12-17 ENCOUNTER — Ambulatory Visit (INDEPENDENT_AMBULATORY_CARE_PROVIDER_SITE_OTHER): Payer: Medicare Other | Admitting: Endocrinology

## 2018-12-17 ENCOUNTER — Other Ambulatory Visit: Payer: Self-pay

## 2018-12-17 ENCOUNTER — Encounter: Payer: Self-pay | Admitting: Endocrinology

## 2018-12-17 VITALS — BP 148/88 | HR 123 | Ht 65.0 in | Wt 365.0 lb

## 2018-12-17 DIAGNOSIS — Z794 Long term (current) use of insulin: Secondary | ICD-10-CM | POA: Diagnosis not present

## 2018-12-17 DIAGNOSIS — R0600 Dyspnea, unspecified: Secondary | ICD-10-CM | POA: Diagnosis not present

## 2018-12-17 DIAGNOSIS — E118 Type 2 diabetes mellitus with unspecified complications: Secondary | ICD-10-CM

## 2018-12-17 LAB — BASIC METABOLIC PANEL
BUN: 23 mg/dL (ref 6–23)
CO2: 24 mEq/L (ref 19–32)
Calcium: 9.7 mg/dL (ref 8.4–10.5)
Chloride: 99 mEq/L (ref 96–112)
Creatinine, Ser: 1.31 mg/dL — ABNORMAL HIGH (ref 0.40–1.20)
GFR: 49.68 mL/min — ABNORMAL LOW (ref >60.00)
Glucose, Bld: 167 mg/dL — ABNORMAL HIGH (ref 70–99)
Potassium: 4.1 mEq/L (ref 3.5–5.1)
Sodium: 137 mEq/L (ref 135–145)

## 2018-12-17 LAB — POCT GLYCOSYLATED HEMOGLOBIN (HGB A1C): Hemoglobin A1C: 7.6 % — AB (ref 4.0–5.6)

## 2018-12-17 LAB — TSH: TSH: 1.41 u[IU]/mL (ref 0.35–4.50)

## 2018-12-17 LAB — BRAIN NATRIURETIC PEPTIDE: Pro B Natriuretic peptide (BNP): 8 pg/mL (ref 0.0–100.0)

## 2018-12-17 LAB — T4, FREE: Free T4: 0.95 ng/dL (ref 0.60–1.60)

## 2018-12-17 MED ORDER — FUROSEMIDE 20 MG PO TABS: 20.0000 mg | ORAL_TABLET | Freq: Every day | ORAL | 1 refills | Status: DC

## 2018-12-17 MED ORDER — CANAGLIFLOZIN 100 MG PO TABS: 100.0000 mg | ORAL_TABLET | Freq: Every day | ORAL | 5 refills | Status: DC

## 2018-12-17 MED ORDER — INSULIN LISPRO (1 UNIT DIAL) 100 UNIT/ML (KWIKPEN): 9.0000 [IU] | PEN_INJECTOR | Freq: Three times a day (TID) | SUBCUTANEOUS | 4 refills | Status: DC

## 2018-12-17 NOTE — Progress Notes (Signed)
Subjective:    Patient ID: [name], female    DOB: 19-Jun-1955, 63 y.o.   MRN: [id number]  HPI Pt returns for f/u of postsurgical hypothyroidism (she had thyroidectomy in approx 1988, for a goiter; she says no cancer was found; she has been on prescribed thyroid hormone therapy since then; she has not recently had thyroid imaging; she does not know why TSH fluctuates, but med noncompliance has been noted; she takes synthroid as rx'ed; MRI (2018) showed normal orbits, and no mention was made of the pituitary).  She takes synthroid as rx'ed.  Since synthroid was increased, she still has fatigue.   Pt also returns for f/u of diabetes mellitus:  DM type: Insulin-requiring type 2.   Dx'ed: 2015.  Complications: renal insuff and TIA.  Therapy: insulin since 2015, and Janumet.   GDM: never.  DKA: never Severe hypoglycemia: never.   Pancreatitis: never Pancreatic imaging: Fatty atrophy was noted on 2018 CT.   Other: she takes multiple daily injections.  Interval history: no cbg record, but states cbg varies from 40-250. It is in general lowest at HS, and highest fasting.  Pt takes meds as rx'ed.  She had 1 episode of near-syncope before lunch.  PO glucose helped.   Past Medical History:  Diagnosis Date   Anemia    Anxiety    Arthritis    "pretty much all my joints"   Asthma    Benign paroxysmal positional vertigo 12/30/2012   Breast cancer ([place]) 02/13/12   ruq  100'clock bx Ductal Carcinoma in Situ,(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   Breast mass in female    L breast 2008, US showed likely fat necrosis   Chronic lower back pain    CKD (chronic kidney disease), stage III    "lower stage" (01/06/2014)   Daily headache    last time was 13-Sep-2016 (brother passed away)   Depression    Dyspnea    Gait abnormality 05/29/2016   GERD (gastroesophageal reflux disease)    History of blood transfusion 2011   "after one of my OR's"   History of hiatal hernia    History of stomach  ulcers    HSV (herpes simplex virus) infection    Hx of radiation therapy 05/05/12- 07/15/12   right breast, 45 gray x 25 fx, lumpectomy cavity boosted to 16.2 gray   Hyperlipemia    Hypertension    sees Dr. 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   partial   benign cyst  Left    removed from left breast   BREAST BIOPSY Right 02/13/2012   BREAST LUMPECTOMY  03/03/2012   Procedure: LUMPECTOMY;  Surgeon: Haywood Lasso, MD;  Location: [place];  Service: General;  Laterality: Right;   BREAST SURGERY Right 01/2012   "  cancer"   CHOLECYSTECTOMY  1980's?   COLONOSCOPY WITH PROPOFOL N/A 09/28/2012   Procedure: COLONOSCOPY WITH PROPOFOL;  Surgeon: Lear Ng, MD;  Location: [facility];  Service: Endoscopy;  Laterality: N/A;   FOOT FRACTURE SURGERY Right 1990's   JOINT REPLACEMENT      x 3   LUMBAR LAMINECTOMY/DECOMPRESSION MICRODISCECTOMY N/A 12/05/2016   Procedure: LEFT L5-S1 MICRODISCECTOMY;  Surgeon: Marybelle Killings, MD;  Location: [facility];  Service: Orthopedics;  Laterality: N/A;   REVISION TOTAL KNEE ARTHROPLASTY  07/2009   SHOULDER OPEN ROTATOR CUFF REPAIR Left 1990's   THYROIDECTOMY  1970's   TOTAL KNEE ARTHROPLASTY Bilateral 2009-06/2009   left; right    Social History   Socioeconomic History   Marital status: Divorced    Spouse name: Not on file   Number of children: 2   Years of  education: 12   Highest education level: Not on file  Occupational History   Occupation: Disabled  Scientist, product/process development strain: Not on file   Food insecurity    Worry: Not on file    Inability: Not on file   Transportation needs    Medical: Not on file    Non-medical: Not on file  Tobacco Use   Smoking status: Former Smoker    Packs/day: 1.00    Years: 7.00    Pack years: 7.00    Types: Cigarettes    Quit date: 03/24/1992    Years since quitting: 26.7   Smokeless tobacco: Never Used  Substance and Sexual Activity   Alcohol use: No    Comment: "stopped drinking in 1996; I was an alcoholic"   Drug use: No   Sexual activity: Never    Birth control/protection: Surgical  Lifestyle   Physical activity    Days per week: Not on file    Minutes per session: Not on file   Stress: Not on file  Relationships   Social connections    Talks on phone: Not on file    Gets together: Not on file    Attends religious service: Not on file    Active member of club or organization: Not on file    Attends meetings of clubs or organizations: Not on file    Relationship status: Not on file   Intimate partner violence    Fear of current or ex partner: Not on file    Emotionally abused: Not on file    Physically abused: Not on file    Forced sexual activity: Not on file  Other Topics Concern   Not on file  Social History Narrative   Single. Disabled Biochemist, clinical.          Patient is right handed.    Denies caffeine use     Current Outpatient Medications on File Prior to Visit  Medication Sig Dispense Refill   albuterol (PROAIR HFA) 108 (90 Base) MCG/ACT inhaler Inhale 2 puffs into the lungs every 4 (four) hours as needed for wheezing or shortness of breath.      aspirin EC 81 MG tablet Take 1 tablet (81 mg total) by mouth daily. 30 tablet 0   B Complex-C (B-COMPLEX WITH VITAMIN C) tablet Take 1 tablet by mouth daily.     budesonide-formoterol  (SYMBICORT) 80-4.5 MCG/ACT inhaler Inhale 2 puffs into the lungs 2 (two) times daily. (Patient taking differently: Inhale 1 puff into the lungs 3 (three) times daily. PRN) 1 Inhaler 11   calcitRIOL (ROCALTROL) 0.25 MCG capsule Take  1 capsule (0.25 mcg total) by mouth 3 (three) times a week. 13 capsule 0   Cholecalciferol (VITAMIN D-3) 1000 units CAPS Take 1,000 Units by mouth daily.     Cyanocobalamin (VITAMIN B-12 PO) Take 1 tablet by mouth daily.     diphenhydrAMINE (BENADRYL) 25 MG tablet Take 50 mg by mouth every 6 (six) hours as needed for itching or allergies.     doxepin (SINEQUAN) 25 MG capsule Take 50 mg by mouth at bedtime.     esomeprazole (NEXIUM) 40 MG capsule Take 1 capsule (40 mg total) by mouth daily before breakfast. (Patient taking differently: Take 40 mg by mouth daily at 12 noon. ) 30 capsule 1   glucose blood test strip TEST BLOOD SUGAR THREE TIMES DAILY 100 each 11   Iron-Vitamins (GERITOL PO) Take 1 tablet by mouth daily.     JANUMET 50-500 MG tablet Take 1 tablet by mouth 2 (two) times daily with a meal. 60 tablet 1   LEVEMIR FLEXTOUCH 100 UNIT/ML Pen Inject 10 Units into the skin at bedtime. 15 mL 4   levothyroxine (SYNTHROID) 175 MCG tablet Take 1 tablet (175 mcg total) by mouth daily before breakfast. 90 tablet 3   linaclotide (LINZESS) 290 MCG CAPS capsule Take 290 mcg by mouth daily before breakfast.     LORazepam (ATIVAN) 0.5 MG tablet Take 0.5 mg by mouth 3 (three) times daily.     losartan-hydrochlorothiazide (HYZAAR) 100-12.5 MG tablet Take 1 tablet by mouth daily.     methocarbamol (ROBAXIN) 500 MG tablet Take 1 tablet (500 mg total) by mouth every 8 (eight) hours as needed for muscle spasms. 30 tablet 0   Multiple Vitamin (MULTIVITAMIN WITH MINERALS) TABS tablet Take 1 tablet by mouth daily.     OneTouch Delica Lancets 40J MISC 1 each by Other route 3 (three) times daily. E11.9 100 each 0   oxyCODONE (ROXICODONE) 15 MG immediate release tablet 15  mg every 6 (six) hours as needed.      pregabalin (LYRICA) 300 MG capsule Take 300 mg by mouth 2 (two) times daily.     rosuvastatin (CRESTOR) 20 MG tablet Take 20 mg by mouth daily.     SURE COMFORT PEN NEEDLES 31G X 5 MM MISC 1 each by Does not apply route 4 (four) times daily. Use to inject insulin 4 times per day 200 each 1   tiZANidine (ZANAFLEX) 4 MG tablet Take 4 mg by mouth 2 (two) times daily.      valACYclovir (VALTREX) 1000 MG tablet Take 1,000 mg by mouth 2 (two) times daily.      venlafaxine XR (EFFEXOR-XR) 150 MG 24 hr capsule Take 150 mg by mouth 2 (two) times daily.     ziprasidone (GEODON) 60 MG capsule Take 60 mg by mouth daily.     No current facility-administered medications on file prior to visit.     Allergies  Allergen Reactions   Bee Venom Anaphylaxis   Shellfish Allergy Anaphylaxis   Buprenorphine Hcl Other (See Comments)    Overly sedated with morphine drip.   Other Other (See Comments)    Cats - bad asthma, stopped up    Family History  Problem Relation Age of Onset   Heart disease Brother        Multiple MIs, starting in his 41s   Diabetes Brother    Thyroid disease Brother    Hypertension Mother    Diabetes Mother    Breast cancer Mother 65  Bone cancer Mother    Hypertension Sister    Diabetes Sister    Breast cancer Sister 12   Thyroid disease Sister    Breast cancer Maternal Grandmother    Heart disease Maternal Grandmother    Uterine cancer Other 19   Breast cancer Paternal Aunt 41   Breast cancer Paternal Grandmother        dx in her 6s   Prostate cancer Paternal Grandfather    Asthma Son     BP (!) 148/88 (BP Location: Left Wrist, Patient Position: Sitting, Cuff Size: Large)    Pulse (!) 123    Ht _0  (1.651 m)    Wt (!) 365 lb (165.6 kg)    SpO2 96%    BMI 60.74 kg/m    Review of Systems Denies LOC.  No change in chronic doe    Objective:   Physical Exam VITAL SIGNS:  See vs page GENERAL: no  distress Pulses: dorsalis pedis intact bilat.   MSK: no deformity of the feet CV: 1+ bilat leg edema Skin:  no ulcer on the feet.  normal color and temp on the feet.  Neuro: sensation is intact to touch on the feet Ext: brace on the left ankle.   Lab Results  Component Value Date   HGBA1C 7.6 (A) 12/17/2018   Lab Results  Component Value Date   TSH 1.04 09/02/2018   Lab Results  Component Value Date   CREATININE 1.29 (H) 08/04/2017   BUN 20 08/04/2017   NA 137 08/04/2017   K 4.8 08/04/2017   CL 95 (L) 08/04/2017   CO2 25 08/04/2017       Assessment & Plan:  Insulin-requiring type 2 DM, with TIA: she needs increased rx, if it can be done with a regimen that avoids or minimizes hypoglycemia. Hypoglycemia: this limits aggressiveness of glycemic control.  Renal failure: reduce lasix to allow for diuretic effect of Invokana.   DOE: check BNP.   Patient Instructions  I have sent a prescription to your pharmacy, to add "invokana." Please redo the blood tests in 1-2 weeks\please reduce the humalog to 9 units 3 times a day (just before each meal) check your blood sugar twice a day.  vary the time of day when you check, between before the 3 meals, and at bedtime.  also check if you have symptoms of your blood sugar being too high or too low.  please keep a record of the readings and bring it to your next appointment here (or you can bring the meter itself).  You can write it on any piece of paper.  please call us sooner if your blood sugar goes below 70, or if you have a lot of readings over 200. Blood tests are requested for you today.  We'll let you know about the results.   Please come back for a follow-up appointment in 2-3 months.

## 2018-12-17 NOTE — Patient Instructions (Addendum)
I have sent a prescription to your pharmacy, to add "invokana." Please redo the blood tests in 1-2 weeks\please reduce the humalog to 9 units 3 times a day (just before each meal) check your blood sugar twice a day.  vary the time of day when you check, between before the 3 meals, and at bedtime.  also check if you have symptoms of your blood sugar being too high or too low.  please keep a record of the readings and bring it to your next appointment here (or you can bring the meter itself).  You can write it on any piece of paper.  please call us sooner if your blood sugar goes below 70, or if you have a lot of readings over 200. Blood tests are requested for you today.  We'll let you know about the results.   Please come back for a follow-up appointment in 2-3 months.

## 2018-12-27 ENCOUNTER — Telehealth: Payer: Self-pay

## 2018-12-27 NOTE — Telephone Encounter (Signed)
Patient called in stating she could not take medication : canagliflozin (INVOKANA) 100 MG TABS tablet   Patient is asking to be prescribed something else  Patient also thinks she needs to cancel appt   Please call and advise

## 2018-12-27 NOTE — Telephone Encounter (Signed)
Called pt for clarification. States she has had decreased urinary output and "restrictions with her breathing". Please advise

## 2018-12-27 NOTE — Telephone Encounter (Signed)
Called pt and informed of new orders. Verbalized acceptance and understanding.

## 2018-12-27 NOTE — Telephone Encounter (Signed)
OK, please d/c invokana. Also, please increase humalog and lasix back to their previous dosages I'll see you next time.

## 2019-01-02 ENCOUNTER — Other Ambulatory Visit: Payer: Self-pay | Admitting: Endocrinology

## 2019-01-02 DIAGNOSIS — N179 Acute kidney failure, unspecified: Secondary | ICD-10-CM

## 2019-01-06 ENCOUNTER — Other Ambulatory Visit: Payer: Medicare Other

## 2019-01-10 ENCOUNTER — Telehealth: Payer: Self-pay

## 2019-01-10 ENCOUNTER — Ambulatory Visit: Payer: Medicare Other | Admitting: Physician Assistant

## 2019-01-10 ENCOUNTER — Other Ambulatory Visit: Payer: Self-pay

## 2019-01-10 DIAGNOSIS — N2581 Secondary hyperparathyroidism of renal origin: Secondary | ICD-10-CM

## 2019-01-10 MED ORDER — CALCITRIOL 0.25 MCG PO CAPS: 0.2500 ug | ORAL_CAPSULE | ORAL | 2 refills | Status: DC

## 2019-01-10 NOTE — Telephone Encounter (Signed)
MEDICATION: calcitRIOL (ROCALTROL) 0.25 MCG capsule  PHARMACY:[phone] (Phone)    IS THIS A 90 DAY SUPPLY :   IS PATIENT OUT OF MEDICATION:   IF NOT; HOW MUCH IS LEFT:   LAST APPOINTMENT DATE: _0 /19/2020  NEXT APPOINTMENT DATE:_1 /04/2019  DO WE HAVE YOUR PERMISSION TO LEAVE A DETAILED MESSAGE:  OTHER COMMENTS:    **Let patient know to contact pharmacy at the end of the day to make sure medication is ready. **  ** Please notify patient to allow 48-72 hours to process**  **Encourage patient to contact the pharmacy for refills or they can request refills through [facility]**

## 2019-01-10 NOTE — Telephone Encounter (Signed)
calcitRIOL (ROCALTROL) 0.25 MCG capsule 12 capsule 2 01/10/2019    Sig - Route: Take 1 capsule (0.25 mcg total) by mouth 3 (three) times a week. - Oral   Sent to pharmacy as: calcitRIOL (ROCALTROL) 0.25 MCG capsule   Notes to Pharmacy: This prescription was filled on 08/28/2018. Any refills authorized will be placed on file.   E-Prescribing Status: Receipt confirmed by pharmacy (01/10/2019 9:34 AM EST)

## 2019-01-11 ENCOUNTER — Other Ambulatory Visit: Payer: Self-pay

## 2019-01-12 ENCOUNTER — Ambulatory Visit (INDEPENDENT_AMBULATORY_CARE_PROVIDER_SITE_OTHER): Payer: Medicare Other | Admitting: Physician Assistant

## 2019-01-12 ENCOUNTER — Ambulatory Visit: Payer: Self-pay

## 2019-01-12 ENCOUNTER — Encounter: Payer: Self-pay | Admitting: Physician Assistant

## 2019-01-12 ENCOUNTER — Other Ambulatory Visit: Payer: Self-pay

## 2019-01-12 VITALS — Ht 65.0 in | Wt 365.0 lb

## 2019-01-12 DIAGNOSIS — M76822 Posterior tibial tendinitis, left leg: Secondary | ICD-10-CM

## 2019-01-12 DIAGNOSIS — M79671 Pain in right foot: Secondary | ICD-10-CM | POA: Diagnosis not present

## 2019-01-12 DIAGNOSIS — M79672 Pain in left foot: Secondary | ICD-10-CM | POA: Diagnosis not present

## 2019-01-12 DIAGNOSIS — M76821 Posterior tibial tendinitis, right leg: Secondary | ICD-10-CM | POA: Diagnosis not present

## 2019-01-12 NOTE — Progress Notes (Signed)
Office Visit Note   Patient: [name]           Date of Birth: [birth year]           MRN: [id number] Visit Date: 01/12/2019              Requested by: Jaynee Eagles, PA-C [address] [place],  [place] [address] PCP: Jaynee Eagles, PA-C  Chief Complaint  Patient presents with  . Left Foot - Pain  . Right Foot - Pain      HPI: The patient is a 63 year old woman who presents today in a wheelchair for mobility complaining of bilateral foot pain.  Her pain is primarily around the ankle as well as the lateral and medial aspects of her feet.  Her right foot is hurting significantly more than the left.  She states when she does walk she is having to walk on the lateral column of her right foot although she does relate this is occurring naturally she is not trying to walk on the lateral column her foot is "just going that way."    The patient is morbidly obese and has been having chronic issues with lower extremity edema these have been much worse over the last 4 weeks she reports worsening of her swelling has coincided with worsening of her pain  Assessment & Plan: Visit Diagnoses:  1. Foot pain, bilateral     Plan: No abnormalities on radiographs.  Having some loss of her arches.  Will place her in a cam walker to rest her tendinitis on the right foot.  As this is her worst as far as pain is concerned.  We will place some supportive pads for her posterior tibial tendon in her shoe wear on the left.  Follow-Up Instructions: No follow-ups on file.   Right Ankle Exam  Swelling: moderate  Range of Motion  The patient has normal right ankle ROM.  Muscle Strength  The patient has normal right ankle strength.  Tests  Anterior drawer: negative Varus tilt: negative  Comments:  Soft tissue is globally tender on the right.  Does have significant tenderness along the course of the posterior tibial tendon as well as to insertion of peroneal tendon. Pain with resisted inversion.  Single limb heel raise deferred.    Left Ankle Exam  Swelling: moderate  Range of Motion  The patient has normal left ankle ROM.   Muscle Strength  The patient has normal left ankle strength.  Tests  Anterior drawer: negative Varus tilt: negative  Comments:  Soft tissue is globally tender on the left.  Does have significant tenderness along the course of the posterior tibial tendon as well as to insertion of peroneal tendon. Single limb heel raise deferred.       Patient is alert, oriented, no adenopathy, well-dressed, normal affect, normal respiratory effort.   Imaging: No results found. No images are attached to the encounter.  Labs: Lab Results  Component Value Date   HGBA1C 7.6 (A) 12/17/2018   HGBA1C 6.6 (A) 09/02/2018   HGBA1C 6.5 (A) 03/23/2018   ESRSEDRATE 38 08/04/2017   ESRSEDRATE 42 (H) 05/12/2013   REPTSTATUS 03/29/2016 FINAL 03/27/2016   CULT NO GROWTH 03/27/2016     Lab Results  Component Value Date   ALBUMIN 4.2 08/04/2017   ALBUMIN 3.7 12/04/2016   ALBUMIN 3.9 11/14/2016    Lab Results  Component Value Date   MG 2.0 04/02/2016   MG 2.0 04/01/2016   MG 1.6 (  L) 03/30/2016   Lab Results  Component Value Date   VD25OH 45.33 03/23/2018   VD25OH 43.39 08/22/2016   VD25OH 40 05/12/2013    No results found for: PREALBUMIN CBC EXTENDED Latest Ref Rng & Units 12/04/2016 11/14/2016 05/01/2016  WBC 4.0 - 10.5 K/uL 9.7 7.7 11.7  RBC 3.87 - 5.11 MIL/uL 3.85(L) 3.97 -  HGB 12.0 - 15.0 g/dL 9.9(L) 10.4(L) 11.0(A)  HCT 36.0 - 46.0 % 32.1(L) 32.5(L) 34(A)  PLT 150 - 400 K/uL 230 251 258  NEUTROABS 1.7 - 7.7 K/uL - 4.7 -  LYMPHSABS 0.7 - 4.0 K/uL - 2.2 -     Body mass index is 60.74 kg/m.  Orders:  Orders Placed This Encounter  Procedures  . XR Foot Complete Right  . XR Foot Complete Left   No orders of the defined types were placed in this encounter.    Procedures: No procedures performed  Clinical Data: No additional findings.   ROS:  All other systems negative, except as noted in the HPI. Review of Systems  Constitutional: Negative for chills and fever.  Cardiovascular: Positive for leg swelling.  Musculoskeletal: Positive for arthralgias, gait problem and myalgias.    Objective: Vital Signs: Ht _0  (1.651 m)   Wt (!) 365 lb (165.6 kg)   BMI 60.74 kg/m   Specialty Comments:  No specialty comments available.  PMFS History: Patient Active Problem List   Diagnosis Date Noted  . Hyperparathyroidism, secondary ([place]) 03/23/2018  . Memory difficulty 12/22/2017  . Spinal stenosis 06/09/2017  . HNP (herniated nucleus pulposus), lumbar 12/05/2016  . 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 Hypocalcemia 08/22/2016  . Gait abnormality 05/29/2016  . Encephalopathy acute 04/23/2016  . Hyponatremia 04/23/2016  . Schizophrenia, paranoid ([place])   . Aspiration pneumonia of left lower lobe due to gastric secretions ([place])   . Projectile vomiting with nausea   . Altered mental status   . Schizophrenia ([place])   . Disorganized schizophrenia ([place])   . Chronic pain syndrome   . Acute renal failure ([place])   . Controlled diabetes mellitus type 2 with complications ([place])   . Acute encephalopathy   . Oropharyngeal dysphagia   . Aspiration pneumonia of right lower lobe due to vomit ([place])   . Acute renal failure (ARF) ([place]) 03/28/2016  . Encephalopathy 03/27/2016  . HCAP (healthcare-associated pneumonia) 02/29/2016  . Pneumonia 02/14/2016  . CAP (community acquired pneumonia) 02/13/2016  . Edema 02/13/2016  . AKI (acute kidney injury) ([place]) 02/13/2016  . Elevated troponin 06/24/2015  . Diarrhea 06/23/2015  . Sepsis ([place]) 06/23/2015  . Obesity, morbid, BMI 50 or higher ([place]) 03/26/2015  . Post traumatic stress disorder (PTSD) 11/14/2014  . Leukocytosis 11/12/2014  . Slurred speech 11/12/2014  . CKD (chronic kidney disease) stage 3, GFR 30-59 ml/min   . Diabetes mellitus, type II, insulin dependent  ([place])   . TIA (transient ischemic attack)   . Hypotension 10/28/2014  . Syncope 10/28/2014  . Anemia 10/28/2014  . Type II diabetes mellitus ([place]) 10/28/2014  . OSA on CPAP 10/28/2014  . Chronic lower back pain 10/28/2014  . Polyneuropathy in diabetes(357.2) 10/28/2014  . Diabetic polyneuropathy ([place])   . Fall 08/14/2014  . Recurrent falls 08/14/2014  . Hot flashes 02/24/2014  . Arthralgia 02/24/2014  . Hair loss 02/24/2014  . Chest pain 01/06/2014  . Shock ([place]) 08/08/2013  . 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 Dysarthria 12/30/2012  . Benign paroxysmal positional vertigo 12/30/2012  . Breast cancer of  upper-outer quadrant of right female breast ([place]) 11/18/2012  . Hemorrhage of rectum and anus 09/28/2012  . Hx of radiation therapy   . Syncope and collapse 06/04/2011  . HSV 10/05/2008  . Mild persistent asthma 06/02/2008  . Dyspnea 07/19/2007  . HLD (hyperlipidemia) 06/02/2006  . Sinus tachycardia 05/25/2006  . Hypothyroidism 02/27/2006  . Depression 02/27/2006  . Essential hypertension 02/27/2006  . GERD 02/27/2006  . KNEE PAIN 02/27/2006   Past Medical History:  Diagnosis Date  . Anemia   . Anxiety   . Arthritis    "pretty much all my joints"  . Asthma   . Benign paroxysmal positional vertigo 12/30/2012  . Breast cancer ([place]) 02/13/12   ruq  100'clock bx Ductal Carcinoma in Situ,(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  . Breast mass in female    L breast 2008, US showed likely fat necrosis  . Chronic lower back pain   . CKD (chronic kidney disease), stage III    "lower stage" (01/06/2014)  . Daily headache    last time was 22-Sep-2016 (brother passed away)  . Depression   . Dyspnea   . Gait abnormality 05/29/2016  . GERD (gastroesophageal reflux disease)   . History of blood transfusion 2011   "after one of my OR's"  . History of hiatal hernia   . History of stomach ulcers   . HSV (herpes simplex virus) infection   . Hx of radiation therapy 05/05/12- 07/15/12   right  breast, 45 gray x 25 fx, lumpectomy cavity boosted to 16.2 gray  . Hyperlipemia   . Hypertension    sees Dr. Criss Rosales , [facility] [place]  . Hypothyroidism   . Kidney stones   . Knee pain, bilateral   . Memory difficulty 12/22/2017  . Migraines    "~ 3 times/month" (01/06/2014)  . Obesity   . OSA on CPAP    pt does not know settings  . Pancreatitis    hx of  . Personal history of radiation therapy 02/2012  . Pneumonia 1950's  . Polyneuropathy in diabetes(357.2)   . Schizophrenia ([place])   . 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 Tachycardia    with sx in 2008  . Thyroid disease   . Type II diabetes mellitus ([place])    dx 2014    Family History  Problem Relation Age of Onset  . Heart disease Brother        Multiple MIs, starting in his 101s  . Diabetes Brother   . Thyroid disease Brother   . Hypertension Mother   . Diabetes Mother   . Breast cancer Mother 39  . Bone cancer Mother   . Hypertension Sister   . Diabetes Sister   . Breast cancer Sister 47  . Thyroid disease Sister   . Breast cancer Maternal Grandmother   . Heart disease Maternal Grandmother   . Uterine cancer Other 19  . Breast cancer Paternal Aunt 42  . Breast cancer Paternal Grandmother        dx in her 29s  . Prostate cancer Paternal Grandfather   . Asthma Son     Past Surgical History:  Procedure Laterality Date  . ABDOMINAL HYSTERECTOMY  1979?   partial  . benign cyst  Left    removed from left breast  . BREAST BIOPSY Right 02/13/2012  . BREAST LUMPECTOMY  03/03/2012   Procedure: LUMPECTOMY;  Surgeon: Haywood Lasso, MD;  Location: [facility];  Service: General;  Laterality: Right;  . BREAST SURGERY Right  01/2012   "cancer"  . CHOLECYSTECTOMY  1980's?  . COLONOSCOPY WITH PROPOFOL N/A 09/28/2012   Procedure: COLONOSCOPY WITH PROPOFOL;  Surgeon: Lear Ng, MD;  [facility];  Service: Endoscopy;  Laterality: N/A;  . FOOT FRACTURE SURGERY Right 1990's  . JOINT REPLACEMENT      x 3   . LUMBAR LAMINECTOMY/DECOMPRESSION MICRODISCECTOMY N/A 12/05/2016   Procedure: LEFT L5-S1 MICRODISCECTOMY;  Surgeon: Marybelle Killings, MD;  Location: [facility];  Service: Orthopedics;  Laterality: N/A;  . REVISION TOTAL KNEE ARTHROPLASTY  07/2009  . SHOULDER OPEN ROTATOR CUFF REPAIR Left 1990's  . THYROIDECTOMY  1970's  . TOTAL KNEE ARTHROPLASTY Bilateral 2009-06/2009   left; right   Social History   Occupational History  . Occupation: Disabled  Tobacco Use  . Smoking status: Former Smoker    Packs/day: 1.00    Years: 7.00    Pack years: 7.00    Types: Cigarettes    Quit date: 03/24/1992    Years since quitting: 26.8  . Smokeless tobacco: Never Used  Substance and Sexual Activity  . 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 Drug use: No  . Sexual activity: Never    Birth control/protection: Surgical

## 2019-01-21 ENCOUNTER — Other Ambulatory Visit: Payer: Self-pay | Admitting: Endocrinology

## 2019-01-26 ENCOUNTER — Encounter: Payer: Self-pay | Admitting: Physician Assistant

## 2019-01-26 ENCOUNTER — Ambulatory Visit (INDEPENDENT_AMBULATORY_CARE_PROVIDER_SITE_OTHER): Payer: Medicare Other | Admitting: Physician Assistant

## 2019-01-26 ENCOUNTER — Other Ambulatory Visit: Payer: Self-pay

## 2019-01-26 VITALS — Ht 65.0 in | Wt 365.0 lb

## 2019-01-26 DIAGNOSIS — M76821 Posterior tibial tendinitis, right leg: Secondary | ICD-10-CM

## 2019-01-26 DIAGNOSIS — M76822 Posterior tibial tendinitis, left leg: Secondary | ICD-10-CM | POA: Diagnosis not present

## 2019-01-26 MED ORDER — PREDNISONE 10 MG PO TABS: 10.0000 mg | ORAL_TABLET | Freq: Every day | ORAL | 1 refills | Status: DC

## 2019-01-26 NOTE — Progress Notes (Signed)
Office Visit Note   Patient: [name]           Date of Birth: December 21, 1955           MRN: [id number] Visit Date: 01/26/2019              Requested by: Jaynee Eagles, PA-C [address] [place],  [place] [address] PCP: Jaynee Eagles, PA-C  Chief Complaint  Patient presents with  . Left Foot - Pain  . Right Foot - Pain      HPI: This is a pleasant 63 year old woman who is here to follow-up on her bilateral ankle pain.  For the most part it is her right ankle that hurts her the most she is approximately 10 years status post reconstruction of her ankle after a traumatic injury.  She does not recall exactly what was done.  For the last month she has had increasing pain in her ankle and her foot.  She also feels like her ankle is quite unstable we did immobilize her for a week in a boot with an arch support but this is not helped  Assessment & Plan: Visit Diagnoses: No diagnosis found.  Plan: We will try a short course of prednisone she has taken in the past at a higher dose and felt shaky I told her if this occurred to stop.  I also told her to monitor her blood sugars while on this medication and to take it with food.  I think the next logical step would be an MRI given her instability and her pain both medial knee and laterally  Follow-Up Instructions: No follow-ups on file.   Ortho Exam  Patient is alert, oriented, no adenopathy, well-dressed, normal affect, normal respiratory effort. Right ankle mild amount of soft tissue swelling no cellulitis compartments are compressible she has a palpable pulse.  She is tender along posterior tibial tendon as well as the peroneal tendons she does not have significant pain over her ankle joint but does demonstrate some impingement findings.  Previous x-rays do not document any significant findings  Imaging: No results found. No images are attached to the encounter.  Labs: Lab Results  Component Value Date   HGBA1C 7.6 (A)  12/17/2018   HGBA1C 6.6 (A) 09/02/2018   HGBA1C 6.5 (A) 03/23/2018   ESRSEDRATE 38 08/04/2017   ESRSEDRATE 42 (H) 05/12/2013   REPTSTATUS 03/29/2016 FINAL 03/27/2016   CULT NO GROWTH 03/27/2016     Lab Results  Component Value Date   ALBUMIN 4.2 08/04/2017   ALBUMIN 3.7 12/04/2016   ALBUMIN 3.9 11/14/2016    Lab Results  Component Value Date   MG 2.0 04/02/2016   MG 2.0 04/01/2016   MG 1.6 (L) 03/30/2016   Lab Results  Component Value Date   VD25OH 45.33 03/23/2018   VD25OH 43.39 08/22/2016   VD25OH 40 05/12/2013    No results found for: PREALBUMIN CBC EXTENDED Latest Ref Rng & Units 12/04/2016 11/14/2016 05/01/2016  WBC 4.0 - 10.5 K/uL 9.7 7.7 11.7  RBC 3.87 - 5.11 MIL/uL 3.85(L) 3.97 -  HGB 12.0 - 15.0 g/dL 9.9(L) 10.4(L) 11.0(A)  HCT 36.0 - 46.0 % 32.1(L) 32.5(L) 34(A)  PLT 150 - 400 K/uL 230 251 258  NEUTROABS 1.7 - 7.7 K/uL - 4.7 -  LYMPHSABS 0.7 - 4.0 K/uL - 2.2 -     Body mass index is 60.74 kg/m.  Orders:  No orders of the defined types were placed in this encounter.  Meds  ordered this encounter  Medications  . predniSONE (DELTASONE) 10 MG tablet    Sig: Take 1 tablet (10 mg total) by mouth daily with breakfast.    Dispense:  30 tablet    Refill:  1     Procedures: No procedures performed  Clinical Data: No additional findings.  ROS:  All other systems negative, except as noted in the HPI. Review of Systems  Objective: Vital Signs: Ht _0  (1.651 m)   Wt (!) 365 lb (165.6 kg)   BMI 60.74 kg/m   Specialty Comments:  No specialty comments available.  PMFS History: Patient Active Problem List   Diagnosis Date Noted  . Hyperparathyroidism, secondary ([place]) 03/23/2018  . Memory difficulty 12/22/2017  . Spinal stenosis 06/09/2017  . HNP (herniated nucleus pulposus), lumbar 12/05/2016  . 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 Hypocalcemia 08/22/2016  . Gait abnormality 05/29/2016  . Encephalopathy acute 04/23/2016   . Hyponatremia 04/23/2016  . Schizophrenia, paranoid ([place])   . Aspiration pneumonia of left lower lobe due to gastric secretions ([place])   . Projectile vomiting with nausea   . Altered mental status   . Schizophrenia ([place])   . Disorganized schizophrenia ([place])   . Chronic pain syndrome   . Acute renal failure ([place])   . Controlled diabetes mellitus type 2 with complications ([place])   . Acute encephalopathy   . Oropharyngeal dysphagia   . Aspiration pneumonia of right lower lobe due to vomit ([place])   . Acute renal failure (ARF) ([place]) 03/28/2016  . Encephalopathy 03/27/2016  . HCAP (healthcare-associated pneumonia) 02/29/2016  . Pneumonia 02/14/2016  . CAP (community acquired pneumonia) 02/13/2016  . Edema 02/13/2016  . AKI (acute kidney injury) ([place]) 02/13/2016  . Elevated troponin 06/24/2015  . Diarrhea 06/23/2015  . Sepsis ([place]) 06/23/2015  . Obesity, morbid, BMI 50 or higher ([place]) 03/26/2015  . Post traumatic stress disorder (PTSD) 11/14/2014  . Leukocytosis 11/12/2014  . Slurred speech 11/12/2014  . CKD (chronic kidney disease) stage 3, GFR 30-59 ml/min   . Diabetes mellitus, type II, insulin dependent ([place])   . TIA (transient ischemic attack)   . Hypotension 10/28/2014  . Syncope 10/28/2014  . Anemia 10/28/2014  . Type II diabetes mellitus ([place]) 10/28/2014  . OSA on CPAP 10/28/2014  . Chronic lower back pain 10/28/2014  . Polyneuropathy in diabetes(357.2) 10/28/2014  . Diabetic polyneuropathy ([place])   . Fall 08/14/2014  . Recurrent falls 08/14/2014  . Hot flashes 02/24/2014  . Arthralgia 02/24/2014  . Hair loss 02/24/2014  . Chest pain 01/06/2014  . Shock ([place]) 08/08/2013  . 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 Dysarthria 12/30/2012  . Benign paroxysmal positional vertigo 12/30/2012  . Breast cancer of upper-outer quadrant of right female breast ([place]) 11/18/2012  . Hemorrhage of rectum and anus 09/28/2012  . Hx of radiation therapy   . Syncope and collapse  06/04/2011  . HSV 10/05/2008  . Mild persistent asthma 06/02/2008  . Dyspnea 07/19/2007  . HLD (hyperlipidemia) 06/02/2006  . Sinus tachycardia 05/25/2006  . Hypothyroidism 02/27/2006  . Depression 02/27/2006  . Essential hypertension 02/27/2006  . GERD 02/27/2006  . KNEE PAIN 02/27/2006   Past Medical History:  Diagnosis Date  . Anemia   . Anxiety   . Arthritis    "pretty much all my joints"  . Asthma   . Benign paroxysmal positional vertigo 12/30/2012  . Breast cancer ([place]) 02/13/12   ruq  100'clock bx Ductal Carcinoma in Situ,(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  [name]  Breast mass in female    L breast 2008, US showed likely fat necrosis  . Chronic lower back pain   . CKD (chronic kidney disease), stage III    "lower stage" (01/06/2014)  . Daily headache    last time was September 11, 2016 (brother passed away)  . Depression   . Dyspnea   . Gait abnormality 05/29/2016  . GERD (gastroesophageal reflux disease)   . History of blood transfusion 2011   "after one of my OR's"  . History of hiatal hernia   . History of stomach ulcers   . HSV (herpes simplex virus) infection   . Hx of radiation therapy 05/05/12- 07/15/12   right breast, 45 gray x 25 fx, lumpectomy cavity boosted to 16.2 gray  . Hyperlipemia   . Hypertension    sees Dr. Criss Rosales , [facility] [place]  . Hypothyroidism   . Kidney stones   . Knee pain, bilateral   . Memory difficulty 12/22/2017  . Migraines    "~ 3 times/month" (01/06/2014)  . Obesity   . OSA on CPAP    pt does not know settings  . Pancreatitis    hx of  . Personal history of radiation therapy 02/2012  . Pneumonia 1950's  . Polyneuropathy in diabetes(357.2)   . Schizophrenia ([place])   . 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 Tachycardia    with sx in 2008  . Thyroid disease   . Type II diabetes mellitus ([place])    dx 2014    Family History  Problem Relation Age of Onset  . Heart disease Brother        Multiple MIs, starting in his 60s  . Diabetes Brother   .  Thyroid disease Brother   . Hypertension Mother   . Diabetes Mother   . Breast cancer Mother 78  . Bone cancer Mother   . Hypertension Sister   . Diabetes Sister   . Breast cancer Sister 82  . Thyroid disease Sister   . Breast cancer Maternal Grandmother   . Heart disease Maternal Grandmother   . Uterine cancer Other 19  . Breast cancer Paternal Aunt 23  . Breast cancer Paternal Grandmother        dx in her 79s  . Prostate cancer Paternal Grandfather   . Asthma Son     Past Surgical History:  Procedure Laterality Date  . ABDOMINAL HYSTERECTOMY  1979?   partial  . benign cyst  Left    removed from left breast  . BREAST BIOPSY Right 02/13/2012  . BREAST LUMPECTOMY  03/03/2012   Procedure: LUMPECTOMY;  Surgeon: Haywood Lasso, MD;  Location: [facility];  Service: General;  Laterality: Right;  . BREAST SURGERY Right 01/2012   "cancer"  . CHOLECYSTECTOMY  1980's?  . COLONOSCOPY WITH PROPOFOL N/A 09/28/2012   Procedure: COLONOSCOPY WITH PROPOFOL;  Surgeon: Lear Ng, MD;  [facility];  Service: Endoscopy;  Laterality: N/A;  . FOOT FRACTURE SURGERY Right 1990's  . JOINT REPLACEMENT      x 3  . LUMBAR LAMINECTOMY/DECOMPRESSION MICRODISCECTOMY N/A 12/05/2016   Procedure: LEFT L5-S1 MICRODISCECTOMY;  Surgeon: Marybelle Killings, MD;  Location: [facility];  Service: Orthopedics;  Laterality: N/A;  . REVISION TOTAL KNEE ARTHROPLASTY  07/2009  . SHOULDER OPEN ROTATOR CUFF REPAIR Left 1990's  . THYROIDECTOMY  1970's  . TOTAL KNEE ARTHROPLASTY Bilateral 2009-06/2009   left; right   Social History   Occupational History  . Occupation: Disabled  Tobacco Use  .  Smoking status: Former Smoker    Packs/day: 1.00    Years: 7.00    Pack years: 7.00    Types: Cigarettes    Quit date: 03/24/1992    Years since quitting: 26.8  . Smokeless tobacco: Never Used  Substance and Sexual Activity  . 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 Drug use:  No  . Sexual activity: Never    Birth control/protection: Surgical

## 2019-02-04 ENCOUNTER — Other Ambulatory Visit: Payer: Self-pay | Admitting: Endocrinology

## 2019-02-04 DIAGNOSIS — E118 Type 2 diabetes mellitus with unspecified complications: Secondary | ICD-10-CM

## 2019-02-04 DIAGNOSIS — Z794 Long term (current) use of insulin: Secondary | ICD-10-CM

## 2019-02-16 ENCOUNTER — Other Ambulatory Visit: Payer: Self-pay

## 2019-02-16 DIAGNOSIS — E118 Type 2 diabetes mellitus with unspecified complications: Secondary | ICD-10-CM

## 2019-02-16 MED ORDER — ONETOUCH DELICA PLUS LANCET33G MISC: 1.0000 | Freq: Three times a day (TID) | 11 refills | Status: DC

## 2019-02-23 ENCOUNTER — Other Ambulatory Visit: Payer: Self-pay | Admitting: Physician Assistant

## 2019-02-25 ENCOUNTER — Other Ambulatory Visit: Payer: Self-pay

## 2019-02-25 DIAGNOSIS — E118 Type 2 diabetes mellitus with unspecified complications: Secondary | ICD-10-CM

## 2019-02-25 DIAGNOSIS — Z794 Long term (current) use of insulin: Secondary | ICD-10-CM

## 2019-02-25 MED ORDER — SURE COMFORT PEN NEEDLES 32G X 6 MM MISC: 1.0000 | Freq: Four times a day (QID) | 0 refills | Status: DC

## 2019-02-25 NOTE — Progress Notes (Signed)
NA

## 2019-02-26 ENCOUNTER — Other Ambulatory Visit: Payer: Self-pay | Admitting: Endocrinology

## 2019-02-26 DIAGNOSIS — N2581 Secondary hyperparathyroidism of renal origin: Secondary | ICD-10-CM

## 2019-03-09 ENCOUNTER — Ambulatory Visit
Admission: RE | Admit: 2019-03-09 | Discharge: 2019-03-09 | Disposition: A | Discharge status: Home / Self Care | Payer: Medicare Other | Source: Ambulatory Visit | Attending: Physician Assistant | Admitting: Physician Assistant

## 2019-03-09 ENCOUNTER — Other Ambulatory Visit: Payer: Self-pay

## 2019-03-09 DIAGNOSIS — M76821 Posterior tibial tendinitis, right leg: Secondary | ICD-10-CM

## 2019-03-15 ENCOUNTER — Other Ambulatory Visit: Payer: Self-pay

## 2019-03-15 ENCOUNTER — Encounter: Payer: Self-pay | Admitting: Orthopedic Surgery

## 2019-03-15 ENCOUNTER — Ambulatory Visit (INDEPENDENT_AMBULATORY_CARE_PROVIDER_SITE_OTHER): Payer: Medicare Other | Admitting: Orthopedic Surgery

## 2019-03-15 VITALS — Ht 65.0 in | Wt 365.0 lb

## 2019-03-15 DIAGNOSIS — S93411A Sprain of calcaneofibular ligament of right ankle, initial encounter: Secondary | ICD-10-CM

## 2019-03-15 NOTE — Progress Notes (Signed)
Office Visit Note   Patient: [name]           Date of Birth: 1955-04-09           MRN: [id number] Visit Date: 03/15/2019              Requested by: Jaynee Eagles, PA-C [id number] [place],  [place] [address] PCP: Fredrich Romans, [place]  Chief Complaint  Patient presents with  . Right Ankle - Follow-up    MRI review       HPI: Patient is a 64 year old woman who was seen for evaluation of lateral ankle pain she complains of episodes of rolling her ankle over due to lateral ankle ligamentous laxity.  Patient states in the past she is used a fracture boot but this did cause back pain.  Assessment & Plan: Visit Diagnoses:  1. Sprain of calcaneofibular ligament of right ankle, initial encounter     Plan: We will have her wear a short fracture boot and try to wear this for at least 6 weeks to help stabilize the lateral ankle ligaments.  Follow-Up Instructions: Return in about 4 weeks (around 04/12/2019), or if symptoms worsen or fail to improve.   Ortho Exam  Patient is alert, oriented, no adenopathy, well-dressed, normal affect, normal respiratory effort. Examination patient is ambulating in a wheelchair she is short of breath seated.  BMI greater than 60.  Examination of the foot she is tender to palpation of the anterior talofibular ligament.  Anterior drawer is positive she has increased varus laxity of the hindfoot with laxity of the anterior talofibular ligament.  The anterior ankle joint is minimally tender to palpation.  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  Imaging: No results found. No images are attached to the encounter.  Labs: Lab Results  Component Value Date   HGBA1C 7.6 (A) 12/17/2018   HGBA1C 6.6 (A) 09/02/2018   HGBA1C 6.5 (A) 03/23/2018   ESRSEDRATE 38 08/04/2017   ESRSEDRATE 42 (H) 05/12/2013    REPTSTATUS 03/29/2016 FINAL 03/27/2016   CULT NO GROWTH 03/27/2016     Lab Results  Component Value Date   ALBUMIN 4.2 08/04/2017   ALBUMIN 3.7 12/04/2016   ALBUMIN 3.9 11/14/2016    Lab Results  Component Value Date   MG 2.0 04/02/2016   MG 2.0 04/01/2016   MG 1.6 (L) 03/30/2016   Lab Results  Component Value Date   VD25OH 45.33 03/23/2018   VD25OH 43.39 08/22/2016   VD25OH 40 05/12/2013    No results found for: PREALBUMIN CBC EXTENDED Latest Ref Rng & Units 12/04/2016 11/14/2016 05/01/2016  WBC 4.0 - 10.5 K/uL 9.7 7.7 11.7  RBC 3.87 - 5.11 MIL/uL 3.85(L) 3.97 -  HGB 12.0 - 15.0 g/dL 9.9(L) 10.4(L) 11.0(A)  HCT 36.0 - 46.0 % 32.1(L) 32.5(L) 34(A)  PLT 150 - 400 K/uL 230 251 258  NEUTROABS 1.7 - 7.7 K/uL - 4.7 -  LYMPHSABS 0.7 - 4.0 K/uL - 2.2 -     Body mass index is 60.74 kg/m.  Orders:  No orders of the defined types were placed in this encounter.  No orders of the defined types were placed in this encounter.    Procedures: No procedures performed  Clinical Data: No additional findings.  ROS:  All other systems negative, except as noted in the HPI. Review  of Systems  Objective: Vital Signs: Ht _0  (1.651 m)   Wt (!) 365 lb (165.6 kg)   BMI 60.74 kg/m   Specialty Comments:  No specialty comments available.  PMFS History: Patient Active Problem List   Diagnosis Date Noted  . Hyperparathyroidism, secondary ([place]) 03/23/2018  . Memory difficulty 12/22/2017  . Spinal stenosis 06/09/2017  . HNP (herniated nucleus pulposus), lumbar 12/05/2016  . 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 Hypocalcemia 08/22/2016  . Gait abnormality 05/29/2016  . Encephalopathy acute 04/23/2016  . Hyponatremia 04/23/2016  . Schizophrenia, paranoid ([place])   . Aspiration pneumonia of left lower lobe due to gastric secretions ([place])   . Projectile vomiting with nausea   . Altered mental status   . Schizophrenia ([place])   . Disorganized schizophrenia  ([place])   . Chronic pain syndrome   . Acute renal failure ([place])   . Controlled diabetes mellitus type 2 with complications ([place])   . Acute encephalopathy   . Oropharyngeal dysphagia   . Aspiration pneumonia of right lower lobe due to vomit ([place])   . Acute renal failure (ARF) ([place]) 03/28/2016  . Encephalopathy 03/27/2016  . HCAP (healthcare-associated pneumonia) 02/29/2016  . Pneumonia 02/14/2016  . CAP (community acquired pneumonia) 02/13/2016  . Edema 02/13/2016  . AKI (acute kidney injury) ([place]) 02/13/2016  . Elevated troponin 06/24/2015  . Diarrhea 06/23/2015  . Sepsis ([place]) 06/23/2015  . Obesity, morbid, BMI 50 or higher ([place]) 03/26/2015  . Post traumatic stress disorder (PTSD) 11/14/2014  . Leukocytosis 11/12/2014  . Slurred speech 11/12/2014  . CKD (chronic kidney disease) stage 3, GFR 30-59 ml/min   . Diabetes mellitus, type II, insulin dependent ([place])   . TIA (transient ischemic attack)   . Hypotension 10/28/2014  . Syncope 10/28/2014  . Anemia 10/28/2014  . Type II diabetes mellitus ([place]) 10/28/2014  . OSA on CPAP 10/28/2014  . Chronic lower back pain 10/28/2014  . Polyneuropathy in diabetes(357.2) 10/28/2014  . Diabetic polyneuropathy ([place])   . Fall 08/14/2014  . Recurrent falls 08/14/2014  . Hot flashes 02/24/2014  . Arthralgia 02/24/2014  . Hair loss 02/24/2014  . Chest pain 01/06/2014  . Shock ([place]) 08/08/2013  . 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 Dysarthria 12/30/2012  . Benign paroxysmal positional vertigo 12/30/2012  . Breast cancer of upper-outer quadrant of right female breast ([place]) 11/18/2012  . Hemorrhage of rectum and anus 09/28/2012  . Hx of radiation therapy   . Syncope and collapse 06/04/2011  . HSV 10/05/2008  . Mild persistent asthma 06/02/2008  . Dyspnea 07/19/2007  . HLD (hyperlipidemia) 06/02/2006  . Sinus tachycardia 05/25/2006  . Hypothyroidism 02/27/2006  . Depression 02/27/2006  . Essential hypertension 02/27/2006  . GERD  02/27/2006  . KNEE PAIN 02/27/2006   Past Medical History:  Diagnosis Date  . Anemia   . Anxiety   . Arthritis    "pretty much all my joints"  . Asthma   . Benign paroxysmal positional vertigo 12/30/2012  . Breast cancer ([place]) 02/13/12   ruq  100'clock bx Ductal Carcinoma in Situ,(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  . Breast mass in female    L breast 2008, US showed likely fat necrosis  . Chronic lower back pain   . CKD (chronic kidney disease), stage III    "lower stage" (01/06/2014)  . Daily headache    last time was 08-26-16 (brother passed away)  . Depression   . Dyspnea   . Gait abnormality 05/29/2016  . GERD (gastroesophageal  reflux disease)   . History of blood transfusion 2011   "after one of my OR's"  . History of hiatal hernia   . History of stomach ulcers   . HSV (herpes simplex virus) infection   . Hx of radiation therapy 05/05/12- 07/15/12   right breast, 45 gray x 25 fx, lumpectomy cavity boosted to 16.2 gray  . Hyperlipemia   . Hypertension    sees Dr. Criss Rosales , [facility] [place]  . Hypothyroidism   . Kidney stones   . Knee pain, bilateral   . Memory difficulty 12/22/2017  . Migraines    "~ 3 times/month" (01/06/2014)  . Obesity   . OSA on CPAP    pt does not know settings  . Pancreatitis    hx of  . Personal history of radiation therapy 02/2012  . Pneumonia 1950's  . Polyneuropathy in diabetes(357.2)   . Schizophrenia ([place])   . 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 Tachycardia    with sx in 2008  . Thyroid disease   . Type II diabetes mellitus ([place])    dx 2014    Family History  Problem Relation Age of Onset  . Heart disease Brother        Multiple MIs, starting in his 30s  . Diabetes Brother   . Thyroid disease Brother   . Hypertension Mother   . Diabetes Mother   . Breast cancer Mother 53  . Bone cancer Mother   . Hypertension Sister   . Diabetes Sister   . Breast cancer Sister 69  . Thyroid disease Sister   . Breast cancer Maternal Grandmother   .  Heart disease Maternal Grandmother   . Uterine cancer Other 19  . Breast cancer Paternal Aunt 8  . Breast cancer Paternal Grandmother        dx in her 89s  . Prostate cancer Paternal Grandfather   . Asthma Son     Past Surgical History:  Procedure Laterality Date  . ABDOMINAL HYSTERECTOMY  1979?   partial  . benign cyst  Left    removed from left breast  . BREAST BIOPSY Right 02/13/2012  . BREAST LUMPECTOMY  03/03/2012   Procedure: LUMPECTOMY;  Surgeon: Haywood Lasso, MD;  Location: [facility];  Service: General;  Laterality: Right;  . BREAST SURGERY Right 01/2012   "cancer"  . CHOLECYSTECTOMY  1980's?  . COLONOSCOPY WITH PROPOFOL N/A 09/28/2012   Procedure: COLONOSCOPY WITH PROPOFOL;  Surgeon: Lear Ng, MD;  Location[facility];  Service: Endoscopy;  Laterality: N/A;  . FOOT FRACTURE SURGERY Right 1990's  . JOINT REPLACEMENT      x 3  . LUMBAR LAMINECTOMY/DECOMPRESSION MICRODISCECTOMY N/A 12/05/2016   Procedure: LEFT L5-S1 MICRODISCECTOMY;  Surgeon: Marybelle Killings, MD;  Location: [facility];  Service: Orthopedics;  Laterality: N/A;  . REVISION TOTAL KNEE ARTHROPLASTY  07/2009  . SHOULDER OPEN ROTATOR CUFF REPAIR Left 1990's  . THYROIDECTOMY  1970's  . TOTAL KNEE ARTHROPLASTY Bilateral 2009-06/2009   left; right   Social History   Occupational History  . Occupation: Disabled  Tobacco Use  . Smoking status: Former Smoker    Packs/day: 1.00    Years: 7.00    Pack years: 7.00    Types: Cigarettes    Quit date: 03/24/1992    Years since quitting: 26.9  . Smokeless tobacco: Never Used  Substance and Sexual Activity  . 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  Drug use: No  . Sexual activity: Never    Birth control/protection: Surgical

## 2019-03-17 ENCOUNTER — Other Ambulatory Visit: Payer: Self-pay | Admitting: Endocrinology

## 2019-03-17 DIAGNOSIS — Z794 Long term (current) use of insulin: Secondary | ICD-10-CM

## 2019-03-17 DIAGNOSIS — E119 Type 2 diabetes mellitus without complications: Secondary | ICD-10-CM

## 2019-03-21 ENCOUNTER — Other Ambulatory Visit: Payer: Self-pay

## 2019-03-23 ENCOUNTER — Encounter: Payer: Self-pay | Admitting: Endocrinology

## 2019-03-23 ENCOUNTER — Ambulatory Visit (INDEPENDENT_AMBULATORY_CARE_PROVIDER_SITE_OTHER): Payer: Medicare Other | Admitting: Endocrinology

## 2019-03-23 ENCOUNTER — Other Ambulatory Visit: Payer: Self-pay

## 2019-03-23 VITALS — BP 132/84 | HR 105 | Ht 65.0 in | Wt 376.8 lb

## 2019-03-23 DIAGNOSIS — E118 Type 2 diabetes mellitus with unspecified complications: Secondary | ICD-10-CM | POA: Diagnosis not present

## 2019-03-23 DIAGNOSIS — Z794 Long term (current) use of insulin: Secondary | ICD-10-CM | POA: Diagnosis not present

## 2019-03-23 LAB — POCT GLYCOSYLATED HEMOGLOBIN (HGB A1C): Hemoglobin A1C: 7.1 % — AB (ref 4.0–5.6)

## 2019-03-23 MED ORDER — INSULIN LISPRO (1 UNIT DIAL) 100 UNIT/ML (KWIKPEN): 10.0000 [IU] | PEN_INJECTOR | Freq: Three times a day (TID) | SUBCUTANEOUS | 4 refills | Status: DC

## 2019-03-23 MED ORDER — JANUMET 50-500 MG PO TABS: 1.0000 | ORAL_TABLET | Freq: Every day | ORAL | 11 refills | Status: DC

## 2019-03-23 NOTE — Patient Instructions (Addendum)
Please continue the same insulins Please reduce the Janumet to 1 pill each morning. check your blood sugar twice a day.  vary the time of day when you check, between before the 3 meals, and at bedtime.  also check if you have symptoms of your blood sugar being too high or too low.  please keep a record of the readings and bring it to your next appointment here (or you can bring the meter itself).  You can write it on any piece of paper.  please call us sooner if your blood sugar goes below 70, or if you have a lot of readings over 200. Please come back for a follow-up appointment in 2-3 months.

## 2019-03-23 NOTE — Progress Notes (Signed)
Subjective:    Patient ID: [name], female    DOB: 1955-08-22, 64 y.o.   MRN: [id number]  HPI Pt returns for f/u of postsurgical hypothyroidism (she had thyroidectomy in approx 1988, for a goiter; she says no cancer was found; she has been on prescribed thyroid hormone therapy since then; she has not recently had thyroid imaging; she does not know why TSH fluctuates, but med noncompliance has been noted; she takes synthroid as rx'ed; MRI (2018) showed normal orbits, and no mention was made of the pituitary).  She takes synthroid as rx'ed.  Since synthroid was increased, she still has fatigue.   Pt also returns for f/u of diabetes mellitus:  DM type: Insulin-requiring type 2.   Dx'ed: 2015.  Complications: renal insuff and TIA.  Therapy: insulin since 2015, and Janumet.   GDM: never.  DKA: never Severe hypoglycemia: never.   Pancreatitis: never Pancreatic imaging: Fatty atrophy was noted on 2018 CT.   Other: she takes multiple daily injections; she did not tolerate Invokana (sob). Interval history: she brings her meter with her cbg's which I have reviewed today.cbg varies from 75-236.  It is in general higher as the day goes on.  Pt takes meds as rx'ed.  She had 1 episode of near-syncope before lunch.  PO glucose helped.  She takes 10 units 3 times a day (just before each meal).   Past Medical History:  Diagnosis Date  . Anemia   . Anxiety   . Arthritis    "pretty much all my joints"  . Asthma   . Benign paroxysmal positional vertigo 12/30/2012  . Breast cancer ([place]) 02/13/12   ruq  100'clock bx Ductal Carcinoma in Situ,(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  . Breast mass in female    L breast 2008, US showed likely fat necrosis  . Chronic lower back pain   . CKD (chronic kidney disease), stage III    "lower stage" (01/06/2014)  . Daily headache    last time was 09/15/2016 (brother passed away)  . Depression   . Dyspnea   . Gait abnormality 05/29/2016  . GERD (gastroesophageal reflux  disease)   . History of blood transfusion 2011   "after one of my OR's"  . History of hiatal hernia   . History of stomach ulcers   . HSV (herpes simplex virus) infection   . Hx of radiation therapy 05/05/12- 07/15/12   right breast, 45 gray x 25 fx, lumpectomy cavity boosted to 16.2 gray  . Hyperlipemia   . Hypertension    sees Dr. Criss Rosales , [facility] [place]  . Hypothyroidism   . Kidney stones   . Knee pain, bilateral   . Memory difficulty 12/22/2017  . Migraines    "~ 3 times/month" (01/06/2014)  . Obesity   . OSA on CPAP    pt does not know settings  . Pancreatitis    hx of  . Personal history of radiation therapy 02/2012  . Pneumonia 1950's  . Polyneuropathy in diabetes(357.2)   . Schizophrenia ([place])   . 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 Tachycardia    with sx in 2008  . Thyroid disease   . Type II diabetes mellitus ([place])    dx 2014    Past Surgical History:  Procedure Laterality Date  . ABDOMINAL HYSTERECTOMY  1979?   partial  . benign cyst  Left    removed from left breast  . BREAST BIOPSY Right 02/13/2012  . BREAST LUMPECTOMY  03/03/2012  Procedure: LUMPECTOMY;  Surgeon: Haywood Lasso, MD;  Location: [facility];  Service: General;  Laterality: Right;  . BREAST SURGERY Right 01/2012   "cancer"  . CHOLECYSTECTOMY  1980's?  . COLONOSCOPY WITH PROPOFOL N/A 09/28/2012   Procedure: COLONOSCOPY WITH PROPOFOL;  Surgeon: Lear Ng, MD;  [facility];  Service: Endoscopy;  Laterality: N/A;  . FOOT FRACTURE SURGERY Right 1990's  . JOINT REPLACEMENT      x 3  . LUMBAR LAMINECTOMY/DECOMPRESSION MICRODISCECTOMY N/A 12/05/2016   Procedure: LEFT L5-S1 MICRODISCECTOMY;  Surgeon: Marybelle Killings, MD;  Location: [facility];  Service: Orthopedics;  Laterality: N/A;  . REVISION TOTAL KNEE ARTHROPLASTY  07/2009  . SHOULDER OPEN ROTATOR CUFF REPAIR Left 1990's  . THYROIDECTOMY  1970's  . TOTAL KNEE ARTHROPLASTY Bilateral 2009-06/2009   left; right    Social  History   Socioeconomic History  . Marital status: Divorced    Spouse name: Not on file  . Number of children: 2  . Years of education: 108  . Highest education level: Not on file  Occupational History  . Occupation: Disabled  Tobacco Use  . Smoking status: Former Smoker    Packs/day: 1.00    Years: 7.00    Pack years: 7.00    Types: Cigarettes    Quit date: 03/24/1992    Years since quitting: 27.0  . Smokeless tobacco: Never Used  Substance and Sexual Activity  . 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 Drug use: No  . Sexual activity: Never    Birth control/protection: Surgical  Other Topics Concern  . Not on file  Social History Narrative   Single. Disabled Biochemist, clinical.          Patient is right handed.    Denies caffeine use    Social Determinants of Radio broadcast assistant Strain:   . Difficulty of Paying Living Expenses: Not on file  Food Insecurity:   . Worried About Charity fundraiser in the Last Year: Not on file  . Ran Out of Food in the Last Year: Not on file  Transportation Needs:   . Lack of Transportation (Medical): Not on file  . Lack of Transportation (Non-Medical): Not on file  Physical Activity:   . Days of Exercise per Week: Not on file  . Minutes of Exercise per Session: Not on file  Stress:   . Feeling of Stress : Not on file  Social Connections:   . Frequency of Communication with Friends and Family: Not on file  . Frequency of Social Gatherings with Friends and Family: Not on file  . Attends Religious Services: Not on file  . Active Member of Clubs or Organizations: Not on file  . Attends Archivist Meetings: Not on file  . Marital Status: Not on file  Intimate Partner Violence:   . Fear of Current or Ex-Partner: Not on file  . Emotionally Abused: Not on file  . Physically Abused: Not on file  . Sexually Abused: Not on file    Current Outpatient Medications on File Prior to  Visit  Medication Sig Dispense Refill  . albuterol (PROAIR HFA) 108 (90 Base) MCG/ACT inhaler Inhale 2 puffs into the lungs every 4 (four) hours as needed for wheezing or shortness of breath.     [name] aspirin EC 81 MG tablet Take 1 tablet (81 mg total) by mouth daily. 30 tablet 0  . B  Complex-C (B-COMPLEX WITH VITAMIN C) tablet Take 1 tablet by mouth daily.    . budesonide-formoterol (SYMBICORT) 80-4.5 MCG/ACT inhaler Inhale 2 puffs into the lungs 2 (two) times daily. (Patient taking differently: Inhale 1 puff into the lungs 3 (three) times daily. PRN) 1 Inhaler 11  . calcitRIOL (ROCALTROL) 0.25 MCG capsule Take 1 capsule (0.25 mcg total) by mouth 3 (three) times a week. 12 capsule 2  . Cholecalciferol (VITAMIN D-3) 1000 units CAPS Take 1,000 Units by mouth daily.    . Cyanocobalamin (VITAMIN B-12 PO) Take 1 tablet by mouth daily.    . diphenhydrAMINE (BENADRYL) 25 MG tablet Take 50 mg by mouth every 6 (six) hours as needed for itching or allergies.    [name] doxepin (SINEQUAN) 25 MG capsule Take 50 mg by mouth at bedtime.    [name] esomeprazole (NEXIUM) 40 MG capsule Take 1 capsule (40 mg total) by mouth daily before breakfast. (Patient taking differently: Take 40 mg by mouth daily at 12 noon. ) 30 capsule 1  . furosemide (LASIX) 20 MG tablet Take 1 tablet (20 mg total) by mouth daily. (Patient taking differently: Take 40 mg by mouth daily. ) 90 tablet 1  . Insulin Pen Needle (SURE COMFORT PEN NEEDLES) 32G X 6 MM MISC 1 each by Does not apply route 4 (four) times daily. E11.9 200 each 0  . Iron-Vitamins (GERITOL PO) Take 1 tablet by mouth daily.    . Lancets (ONETOUCH DELICA PLUS KYHCWC37S) MISC 1 each by Other route 3 (three) times daily. Use to check blood sugar 3 times a day 300 each 11  . LEVEMIR FLEXTOUCH 100 UNIT/ML Pen Inject 10 Units into the skin at bedtime. 15 mL 4  . levothyroxine (SYNTHROID) 175 MCG tablet Take 1 tablet (175 mcg total) by mouth daily before breakfast. 90 tablet 3  . linaclotide  (LINZESS) 290 MCG CAPS capsule Take 290 mcg by mouth daily before breakfast.    . LORazepam (ATIVAN) 0.5 MG tablet Take 0.5 mg by mouth 3 (three) times daily.    [name] losartan-hydrochlorothiazide (HYZAAR) 100-12.5 MG tablet Take 1 tablet by mouth daily.    . methocarbamol (ROBAXIN) 500 MG tablet Take 1 tablet (500 mg total) by mouth every 8 (eight) hours as needed for muscle spasms. 30 tablet 0  . Multiple Vitamin (MULTIVITAMIN WITH MINERALS) TABS tablet Take 1 tablet by mouth daily.    Glory Rosebush VERIO test strip TEST BLOOD SUGAR THREE TIMES DAILY 100 strip 11  . oxyCODONE (ROXICODONE) 15 MG immediate release tablet 15 mg every 6 (six) hours as needed.     . predniSONE (DELTASONE) 10 MG tablet Take 1 tablet (10 mg total) by mouth daily with breakfast. 30 tablet 1  . pregabalin (LYRICA) 300 MG capsule Take 300 mg by mouth 2 (two) times daily.    . rosuvastatin (CRESTOR) 20 MG tablet Take 20 mg by mouth daily.    [name] tiZANidine (ZANAFLEX) 4 MG tablet Take 4 mg by mouth 2 (two) times daily.     . valACYclovir (VALTREX) 1000 MG tablet Take 1,000 mg by mouth 2 (two) times daily.     [name] venlafaxine XR (EFFEXOR-XR) 150 MG 24 hr capsule Take 150 mg by mouth 2 (two) times daily.    . ziprasidone (GEODON) 60 MG capsule Take 60 mg by mouth daily.     No current facility-administered medications on file prior to visit.    Allergies  Allergen Reactions  . Bee Venom Anaphylaxis  . Invokana [Canagliflozin]  Anaphylaxis    Dyspnea and urinary retention  . Shellfish Allergy Anaphylaxis  . Buprenorphine Hcl Other (See Comments)    Overly sedated with morphine drip.  . Other Other (See Comments)    Cats - bad asthma, stopped up    Family History  Problem Relation Age of Onset  . Heart disease Brother        Multiple MIs, starting in his 15s  . Diabetes Brother   . Thyroid disease Brother   . Hypertension Mother   . Diabetes Mother   . Breast cancer Mother 16  . Bone cancer Mother   . Hypertension  Sister   . Diabetes Sister   . Breast cancer Sister 26  . Thyroid disease Sister   . Breast cancer Maternal Grandmother   . Heart disease Maternal Grandmother   . Uterine cancer Other 19  . Breast cancer Paternal Aunt 18  . Breast cancer Paternal Grandmother        dx in her 59s  . Prostate cancer Paternal Grandfather   . Asthma Son     BP 132/84 (BP Location: Left Wrist, Patient Position: Sitting, Cuff Size: Large)   Pulse (!) 105   Ht _0  (1.651 m)   Wt (!) 376 lb 12.8 oz (170.9 kg)   SpO2 91%   BMI 62.70 kg/m    Review of Systems She denies hypoglycemia.      Objective:   Physical Exam VITAL SIGNS:  See vs page GENERAL: no distress Pulses: dorsalis pedis intact bilat.   MSK: no deformity of the feet CV: trace bilat leg edema.   Skin:  no ulcer on the feet.  normal color and temp on the feet.  Neuro: sensation is intact to touch on the feet.    Lab Results  Component Value Date   CREATININE 1.31 (H) 12/17/2018   BUN 23 12/17/2018   NA 137 12/17/2018   K 4.1 12/17/2018   CL 99 12/17/2018   CO2 24 12/17/2018     Lab Results  Component Value Date   HGBA1C 7.1 (A) 03/23/2019   Lab Results  Component Value Date   TSH 1.41 12/17/2018      Assessment & Plan:  Insulin-requiring type 2 DM, with TIA.  Renal insuff: This limits Janumet dosage.  Edema: This limits rx options Near-syncope: poss due to hypoglycemia, so we'll reduce overall DM rx.   Patient Instructions  Please continue the same insulins Please reduce the Janumet to 1 pill each morning. check your blood sugar twice a day.  vary the time of day when you check, between before the 3 meals, and at bedtime.  also check if you have symptoms of your blood sugar being too high or too low.  please keep a record of the readings and bring it to your next appointment here (or you can bring the meter itself).  You can write it on any piece of paper.  please call us sooner if your blood sugar goes below 70, or  if you have a lot of readings over 200. Please come back for a follow-up appointment in 2-3 months.

## 2019-04-04 ENCOUNTER — Other Ambulatory Visit: Payer: Self-pay | Admitting: Endocrinology

## 2019-04-04 NOTE — Telephone Encounter (Signed)
Please forward refill request to pt's primary care provider.

## 2019-04-12 ENCOUNTER — Ambulatory Visit: Payer: Medicare Other | Admitting: Orthopedic Surgery

## 2019-04-19 IMAGING — CT CT L SPINE W/O CM
2 of 7 series · 10 of 20 positions shown, 12 images · non-contrast
Comparison: MRI lumbar spine 04/17/2017

CLINICAL DATA: Back pain with paresthesias and numbness. History of
breast cancer.

EXAM:
CT THORACIC AND LUMBAR SPINE WITHOUT CONTRAST
TECHNIQUE: Multidetector CT imaging of the thoracic and lumbar spine was
performed without contrast. Multiplanar CT image reconstructions
were also generated.

[Series 3: t & l soft · axial · 0.38mm/px · z∈[-600,-168]mm · 7 of 192 slices shown, 9 images]
[im 24/192  soft-tissue]
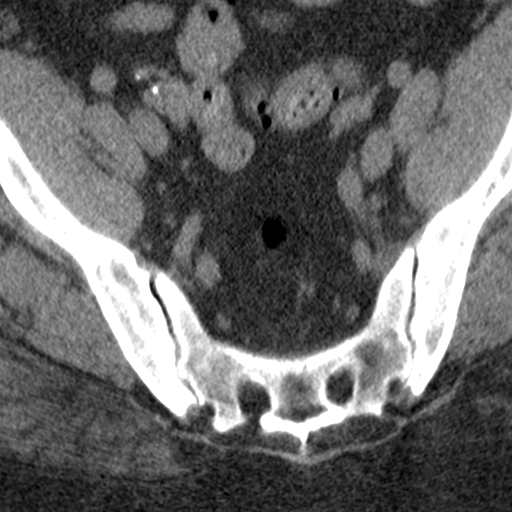
[im 24/192  bone]
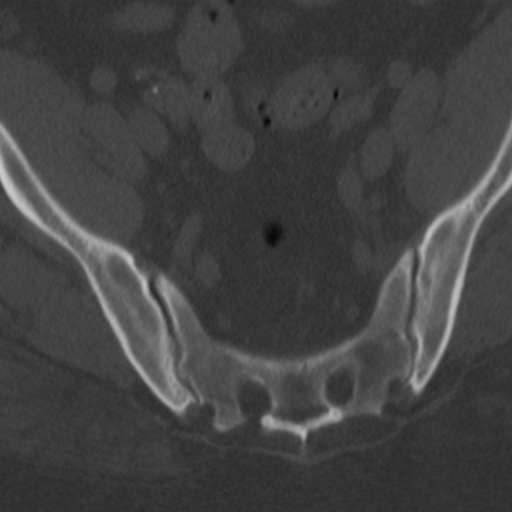
[im 48/192  bone]
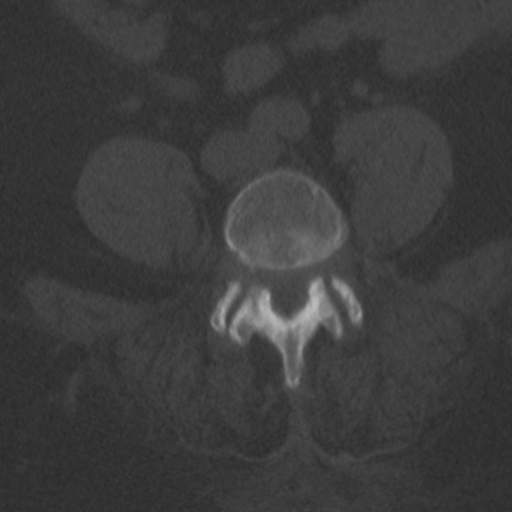
[im 72/192  bone]
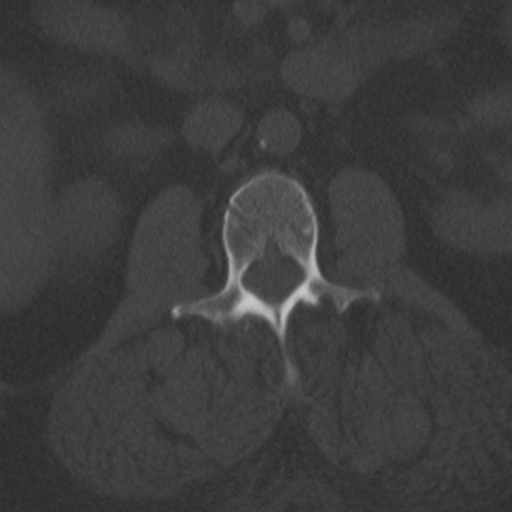
[im 96/192  bone]
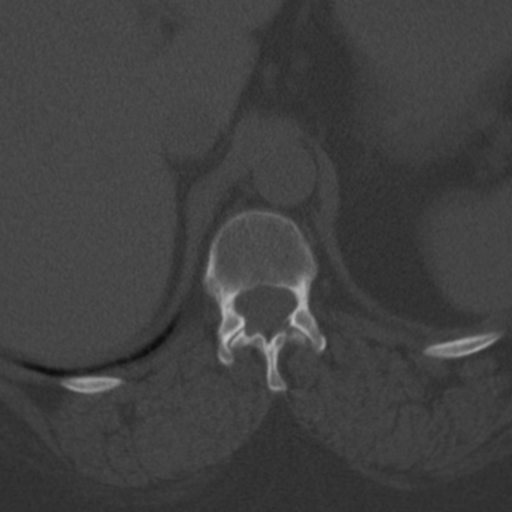
[im 120/192  soft-tissue]
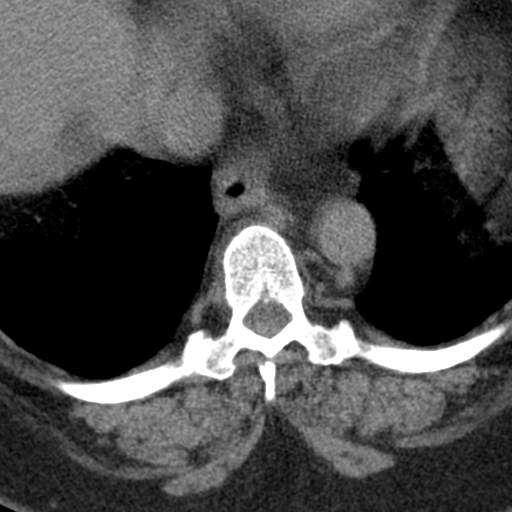
[im 120/192  bone]
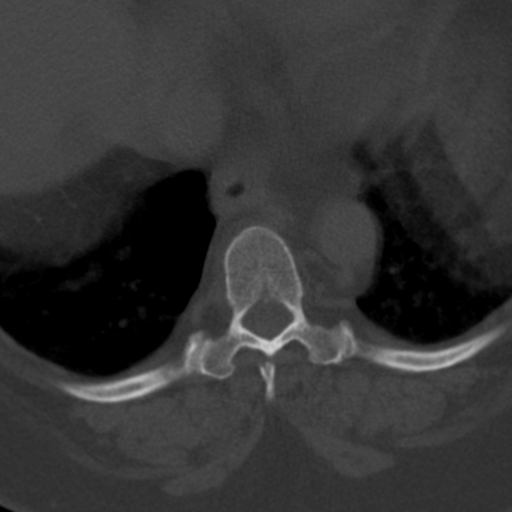
[im 144/192  bone]
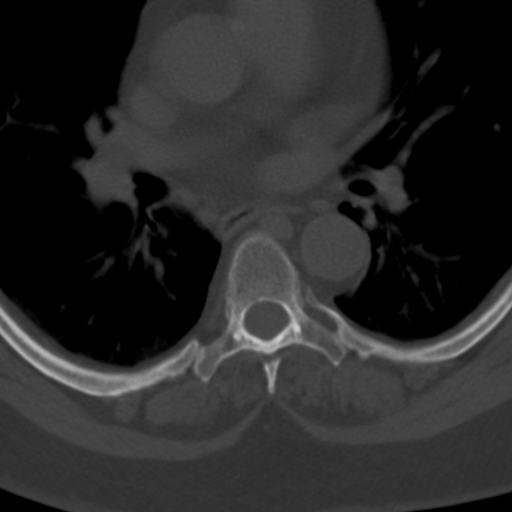
[im 168/192  bone]
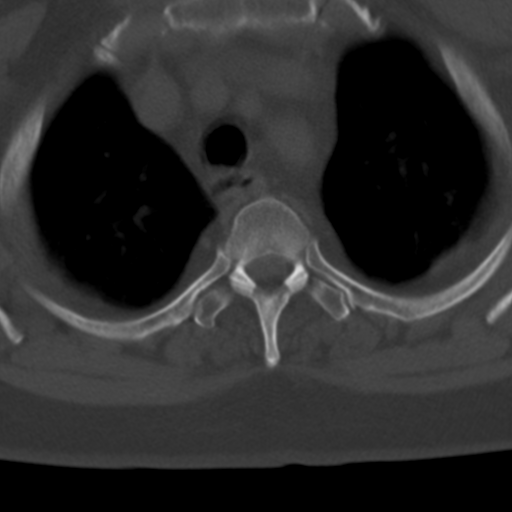

[Series 602: cor t spine · coronal · 0.71mm/px · 3 of 47 slices shown]
[im 10/47  bone]
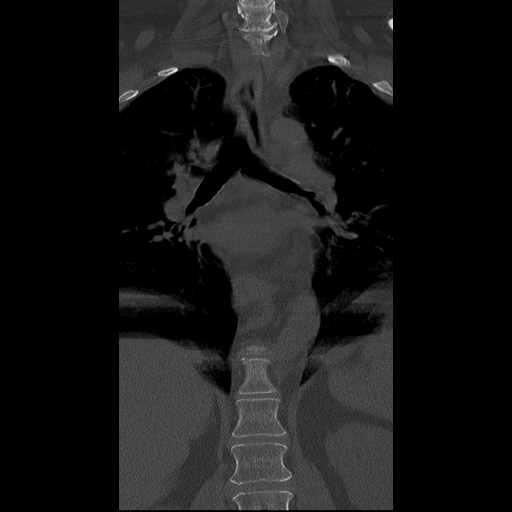
[im 19/47  bone]
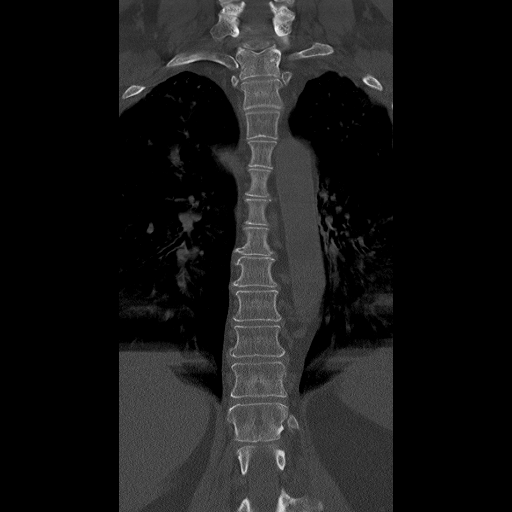
[im 28/47  bone]
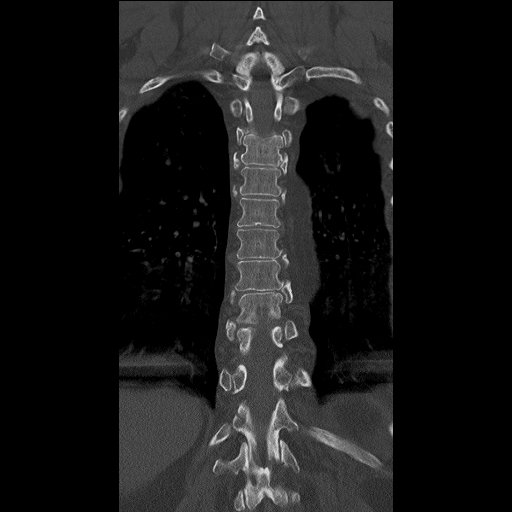

[10 of 20 positions shown; findings below may reference images not displayed]

FINDINGS: CT THORACIC SPINE FINDINGS

Alignment: Normal

Vertebrae: Negative for fracture or mass. Negative for metastatic
disease.

Paraspinal and other soft tissues: Negative for paraspinous soft
tissue mass. Visualized lungs are clear. No pleural effusion.
Coronary artery calcification. Small hiatal hernia.

Disc levels: MRI is more sensitive than CT for disc disease.
Allowing for this limitation, there is mild thoracic disc
degeneration. Small central calcified chronic disc protrusion at
T6-7. No significant spinal stenosis.

CT LUMBAR SPINE FINDINGS

Segmentation: Normal

Alignment: Normal

Vertebrae: Negative for fracture or mass lesion

Paraspinal and other soft tissues: Negative for paraspinous mass.
Cholecystectomy clips. No lymphadenopathy.

Disc levels: L1-2: Negative

L2-3: Mild spinal stenosis. Mild disc bulging and mild facet
degeneration

L3-4: Moderate spinal stenosis. Diffuse disc bulging with moderate
to severe facet degeneration bilaterally. Moderate subarticular
stenosis bilaterally.

L4-5: Moderate spinal stenosis. Diffuse disc bulging with advanced
facet degeneration bilaterally. Moderate subarticular stenosis
bilaterally.

L5-S1: Left laminectomy. Disc and facet degeneration. Left-sided
disc protrusion better seen on prior MRI. Mild subarticular stenosis
bilaterally.
IMPRESSION: CT THORACIC SPINE IMPRESSION

Mild thoracic disc degeneration.  Negative for fracture or mass.

CT LUMBAR SPINE IMPRESSION

Negative for fracture or mass in the lumbar spine.

Moderate spinal stenosis at L3-4 and L4-5 unchanged from the prior
MRI.

Left laminectomy L5-S1. Recurrent left-sided disc protrusion better
seen on the prior MRI and difficult to identify on CT.

## 2019-04-20 ENCOUNTER — Emergency Department (HOSPITAL_BASED_OUTPATIENT_CLINIC_OR_DEPARTMENT_OTHER)
Admit: 2019-04-20 | Discharge: 2019-04-20 | Disposition: A | Discharge status: Home / Self Care | Payer: Medicare Other | Attending: Emergency Medicine | Admitting: Emergency Medicine

## 2019-04-20 ENCOUNTER — Emergency Department (HOSPITAL_COMMUNITY)
Admission: EM | Admit: 2019-04-20 | Discharge: 2019-04-20 | Disposition: A | Discharge status: Home / Self Care | Payer: Medicare Other | Attending: Emergency Medicine | Admitting: Emergency Medicine

## 2019-04-20 ENCOUNTER — Other Ambulatory Visit: Payer: Self-pay

## 2019-04-20 ENCOUNTER — Encounter (HOSPITAL_COMMUNITY): Payer: Self-pay | Admitting: Emergency Medicine

## 2019-04-20 ENCOUNTER — Emergency Department (HOSPITAL_COMMUNITY): Payer: Medicare Other

## 2019-04-20 DIAGNOSIS — R0981 Nasal congestion: Secondary | ICD-10-CM | POA: Diagnosis not present

## 2019-04-20 DIAGNOSIS — Z87891 Personal history of nicotine dependence: Secondary | ICD-10-CM | POA: Insufficient documentation

## 2019-04-20 DIAGNOSIS — Z20822 Contact with and (suspected) exposure to covid-19: Secondary | ICD-10-CM | POA: Diagnosis not present

## 2019-04-20 DIAGNOSIS — Z853 Personal history of malignant neoplasm of breast: Secondary | ICD-10-CM | POA: Diagnosis not present

## 2019-04-20 DIAGNOSIS — I129 Hypertensive chronic kidney disease with stage 1 through stage 4 chronic kidney disease, or unspecified chronic kidney disease: Secondary | ICD-10-CM | POA: Insufficient documentation

## 2019-04-20 DIAGNOSIS — R0602 Shortness of breath: Secondary | ICD-10-CM | POA: Diagnosis present

## 2019-04-20 DIAGNOSIS — N183 Chronic kidney disease, stage 3 unspecified: Secondary | ICD-10-CM | POA: Insufficient documentation

## 2019-04-20 DIAGNOSIS — G20 Parkinson's disease: Secondary | ICD-10-CM | POA: Insufficient documentation

## 2019-04-20 DIAGNOSIS — R509 Fever, unspecified: Secondary | ICD-10-CM | POA: Diagnosis not present

## 2019-04-20 DIAGNOSIS — F250 Schizoaffective disorder, bipolar type: Secondary | ICD-10-CM | POA: Diagnosis not present

## 2019-04-20 DIAGNOSIS — R110 Nausea: Secondary | ICD-10-CM | POA: Diagnosis not present

## 2019-04-20 DIAGNOSIS — R197 Diarrhea, unspecified: Secondary | ICD-10-CM | POA: Insufficient documentation

## 2019-04-20 DIAGNOSIS — R609 Edema, unspecified: Secondary | ICD-10-CM | POA: Diagnosis not present

## 2019-04-20 DIAGNOSIS — R600 Localized edema: Secondary | ICD-10-CM

## 2019-04-20 DIAGNOSIS — E039 Hypothyroidism, unspecified: Secondary | ICD-10-CM | POA: Insufficient documentation

## 2019-04-20 LAB — CBC WITH DIFFERENTIAL/PLATELET
Abs Immature Granulocytes: 0.04 10*3/uL (ref 0.00–0.07)
Basophils Absolute: 0.1 10*3/uL (ref 0.0–0.1)
Basophils Relative: 1 %
Eosinophils Absolute: 0.1 10*3/uL (ref 0.0–0.5)
Eosinophils Relative: 2 %
HCT: 35 % — ABNORMAL LOW (ref 36.0–46.0)
Hemoglobin: 11.2 g/dL — ABNORMAL LOW (ref 12.0–15.0)
Immature Granulocytes: 1 %
Lymphocytes Relative: 21 %
Lymphs Abs: 1.5 10*3/uL (ref 0.7–4.0)
MCH: 28.1 pg (ref 26.0–34.0)
MCHC: 32 g/dL (ref 30.0–36.0)
MCV: 87.9 fL (ref 80.0–100.0)
Monocytes Absolute: 0.5 10*3/uL (ref 0.1–1.0)
Monocytes Relative: 7 %
Neutro Abs: 4.8 10*3/uL (ref 1.7–7.7)
Neutrophils Relative %: 68 %
Platelets: 223 10*3/uL (ref 150–400)
RBC: 3.98 MIL/uL (ref 3.87–5.11)
RDW: 15.3 % (ref 11.5–15.5)
WBC: 7.1 10*3/uL (ref 4.0–10.5)
nRBC: 0 % (ref 0.0–0.2)

## 2019-04-20 LAB — COMPREHENSIVE METABOLIC PANEL
ALT: 18 U/L (ref 0–44)
AST: 19 U/L (ref 15–41)
Albumin: 3.5 g/dL (ref 3.5–5.0)
Alkaline Phosphatase: 78 U/L (ref 38–126)
Anion gap: 9 (ref 5–15)
BUN: 17 mg/dL (ref 8–23)
CO2: 27 mmol/L (ref 22–32)
Calcium: 9.4 mg/dL (ref 8.9–10.3)
Chloride: 99 mmol/L (ref 98–111)
Creatinine, Ser: 1.21 mg/dL — ABNORMAL HIGH (ref 0.44–1.00)
GFR calc Af Amer: 56 mL/min — ABNORMAL LOW (ref >60)
GFR calc non Af Amer: 48 mL/min — ABNORMAL LOW (ref >60)
Glucose, Bld: 154 mg/dL — ABNORMAL HIGH (ref 70–99)
Potassium: 4.4 mmol/L (ref 3.5–5.1)
Sodium: 135 mmol/L (ref 135–145)
Total Bilirubin: 0.4 mg/dL (ref 0.3–1.2)
Total Protein: 6.2 g/dL — ABNORMAL LOW (ref 6.5–8.1)

## 2019-04-20 LAB — URINALYSIS, ROUTINE W REFLEX MICROSCOPIC
Bilirubin Urine: NEGATIVE
Glucose, UA: 150 mg/dL — AB
Hgb urine dipstick: NEGATIVE
Ketones, ur: NEGATIVE mg/dL
Leukocytes,Ua: NEGATIVE
Nitrite: NEGATIVE
Protein, ur: NEGATIVE mg/dL
Specific Gravity, Urine: 1.012 (ref 1.005–1.030)
pH: 8 (ref 5.0–8.0)

## 2019-04-20 LAB — BRAIN NATRIURETIC PEPTIDE: B Natriuretic Peptide: 33 pg/mL (ref 0.0–100.0)

## 2019-04-20 LAB — LIPASE, BLOOD: Lipase: 19 U/L (ref 11–51)

## 2019-04-20 LAB — POC SARS CORONAVIRUS 2 AG -  ED: SARS Coronavirus 2 Ag: NEGATIVE

## 2019-04-20 MED ORDER — SODIUM CHLORIDE 0.9 % IV BOLUS
500.0000 mL | Freq: Once | INTRAVENOUS | Status: AC
  Administered 2019-04-20: 16:00:00 500 mL via INTRAVENOUS

## 2019-04-20 MED ORDER — OXYCODONE HCL 5 MG PO TABS
15.0000 mg | ORAL_TABLET | Freq: Once | ORAL | Status: AC
  Administered 2019-04-20: 15 mg via ORAL
  Filled 2019-04-20: qty 3

## 2019-04-20 MED ORDER — ACETAMINOPHEN 325 MG PO TABS
650.0000 mg | ORAL_TABLET | Freq: Once | ORAL | Status: AC
  Administered 2019-04-20: 15:00:00 650 mg via ORAL
  Filled 2019-04-20: qty 2

## 2019-04-20 MED ORDER — FUROSEMIDE 10 MG/ML IJ SOLN
40.0000 mg | Freq: Once | INTRAMUSCULAR | Status: AC
  Administered 2019-04-20: 18:00:00 40 mg via INTRAVENOUS
  Filled 2019-04-20: qty 4

## 2019-04-20 NOTE — Progress Notes (Signed)
Right lower extremity venous duplex has been completed. Preliminary results can be found in CV Proc through chart review.  Results were given to Alecia Lemming PA.  04/20/19 4:35 PM Carlos Levering RVT

## 2019-04-20 NOTE — ED Notes (Signed)
Pt was discharged from the ED. Pt read and understood discharge paperwork. Pt had vital signs completed. Pt conscious, breathing, and A&Ox4. No distress noted. Pt speaking in complete sentences. Pt brought out of the ED via wheelchair.  

## 2019-04-20 NOTE — ED Provider Notes (Addendum)
5:37 PM Signout from [employer] at shift change.   Patient presents to the emergency department complaint of generalized illness, nasal congestion and facial pressure, fever to 100 F at home.  She thinks that her sinuses have been the cause of her problems.  She states that she has been having worsening swelling above baseline in her right leg.  She does state that she is short of breath when she walks around, more than just walking across the room.  Per EMS, patient was very weak and unable to get up at home.  Patient states she typically walks with a walker.  No history of heart failure per patient but is on Lasix.  Echocardiogram from 2018 showed EF 65-70% with grade 1 diastolic dysfunction.  BP (!) 144/95   Pulse 87   Temp 99.2 F (37.3 C) (Oral)   Resp 14   SpO2 93%   Reviewed patient's chest x-ray which shows signs of pulmonary edema.  Added BNP.  I spoke with the ultrasonographer who states that venous Doppler was limited but no obvious blood clot.  Will attempt to ambulate the patient to assess functional status.  Patient does not typically wear oxygen at home.  7:00 PM patient has ambulated and maintained normal oxygen saturation.  BNP is normal.   7:45 PM Patient appears to be resting comfortably in bed.  We discussed results of tests.  She is not in any distress.  Patient is comfortable with discharge to home.  I do not suspect severe heart failure at this point.  She has been urinating with good response after getting the Lasix.  I have encouraged her to continue taking this.  Patient has not been febrile here.  She does not report any temperatures greater than 100 degrees.  She states that her sinus problems are chronic and the right now feels okay.   BP 126/75 (BP Location: Left Arm)   Pulse 86   Temp 99.2 F (37.3 C) (Oral)   Resp 20   SpO2 95%   Chest x-ray had possibility of fluid patient has had an echo which showed grade 1 diastolic dysfunction in the past.  However,  patient has been able to ambulate without desaturation.  BNP was normal.  Patient has been given a dose of Lasix with good urinary output and feel that she can be continued on oral Lasix at home per her PCP.  EKG reviewed and is not markedly different from 2018.  She denies any chest pain.  Doubt ACS, dissection, aneurysm, PNA.  Low concern for PE.  No hypoxia or tachycardia.  Plan for discharged home with close PCP follow-up.  Encouraged follow-up in the next 48 hours.  Encouraged return to the emergency department with worsening or changing chest pain, fevers, worsening shortness of breath or trouble breathing, new symptoms or other concerns.   Carlisle Cater, PA-C 04/20/19 1953    Veryl Speak, MD 04/20/19 2146

## 2019-04-20 NOTE — ED Provider Notes (Signed)
[facility] EMERGENCY DEPARTMENT Provider Note   CSN: [id number] Arrival date & time: 04/20/19  1320     History Chief Complaint  Patient presents with  . Shortness of Breath  . Fever    [name] is a 64 y.o. female with morbid obesity, lymphedema, Type 2 DM, CKD, chronic pain syndrome who presents with fever. She states that she has been "sick" for 2 days. She reports a low grade fever of 100 with nasal congestion, runny nose, worsening SOB, abdominal pain, nausea, diarrhea. She also has had worsening swelling in her right foot. She states she hasn't been eating or drinking much and hasn't been urinating despite taking Lasix. She denies chest pain, cough, vomiting. She has been wearing her CPAP for 3 days due to increased SOB. Her home health aide was concerned about her appearance and EMS was called. She lives with her nephew and states that he has been well. She has not had a COVID vaccine.  HPI     Past Medical History:  Diagnosis Date  . Anemia   . Anxiety   . Arthritis    "pretty much all my joints"  . Asthma   . Benign paroxysmal positional vertigo 12/30/2012  . Breast cancer ([place]) 02/13/12   ruq  100'clock bx Ductal Carcinoma in Situ,(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  . Breast mass in female    L breast 2008, US showed likely fat necrosis  . Chronic lower back pain   . CKD (chronic kidney disease), stage III    "lower stage" (01/06/2014)  . Daily headache    last time was 2016/09/01 (brother passed away)  . Depression   . Dyspnea   . Gait abnormality 05/29/2016  . GERD (gastroesophageal reflux disease)   . History of blood transfusion 2011   "after one of my OR's"  . History of hiatal hernia   . History of stomach ulcers   . HSV (herpes simplex virus) infection   . Hx of radiation therapy 05/05/12- 07/15/12   right breast, 45 gray x 25 fx, lumpectomy cavity boosted to 16.2 gray  . Hyperlipemia   . Hypertension    sees Dr. Criss Rosales , [facility]  .  Hypothyroidism   . Kidney stones   . Knee pain, bilateral   . Memory difficulty 12/22/2017  . Migraines    "~ 3 times/month" (01/06/2014)  . Obesity   . OSA on CPAP    pt does not know settings  . Pancreatitis    hx of  . Personal history of radiation therapy 02/2012  . Pneumonia 1950's  . Polyneuropathy in diabetes(357.2)   . Schizophrenia ([place])   . 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 Tachycardia    with sx in 2008  . Thyroid disease   . Type II diabetes mellitus ([place])    dx 2014    Patient Active Problem List   Diagnosis Date Noted  . Hyperparathyroidism, secondary ([place]) 03/23/2018  . Memory difficulty 12/22/2017  . Spinal stenosis 06/09/2017  . HNP (herniated nucleus pulposus), lumbar 12/05/2016  . 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 Hypocalcemia 08/22/2016  . Gait abnormality 05/29/2016  . Encephalopathy acute 04/23/2016  . Hyponatremia 04/23/2016  . Schizophrenia, paranoid ([place])   . Aspiration pneumonia of left lower lobe due to gastric secretions ([place])   . Projectile vomiting with nausea   . Altered mental status   . Schizophrenia ([place])   . Disorganized schizophrenia ([place])   . Chronic pain  syndrome   . Acute renal failure ([place])   . Controlled diabetes mellitus type 2 with complications ([place])   . Acute encephalopathy   . Oropharyngeal dysphagia   . Aspiration pneumonia of right lower lobe due to vomit ([place])   . Acute renal failure (ARF) ([place]) 03/28/2016  . Encephalopathy 03/27/2016  . HCAP (healthcare-associated pneumonia) 02/29/2016  . Pneumonia 02/14/2016  . CAP (community acquired pneumonia) 02/13/2016  . Edema 02/13/2016  . AKI (acute kidney injury) ([place]) 02/13/2016  . Elevated troponin 06/24/2015  . Diarrhea 06/23/2015  . Sepsis ([place]) 06/23/2015  . Obesity, morbid, BMI 50 or higher ([place]) 03/26/2015  . Post traumatic stress disorder (PTSD) 11/14/2014  . Leukocytosis 11/12/2014  . Slurred speech 11/12/2014  . CKD (chronic  kidney disease) stage 3, GFR 30-59 ml/min   . Diabetes mellitus, type II, insulin dependent ([place])   . TIA (transient ischemic attack)   . Hypotension 10/28/2014  . Syncope 10/28/2014  . Anemia 10/28/2014  . Type II diabetes mellitus ([place]) 10/28/2014  . OSA on CPAP 10/28/2014  . Chronic lower back pain 10/28/2014  . Polyneuropathy in diabetes(357.2) 10/28/2014  . Diabetic polyneuropathy ([place])   . Fall 08/14/2014  . Recurrent falls 08/14/2014  . Hot flashes 02/24/2014  . Arthralgia 02/24/2014  . Hair loss 02/24/2014  . Chest pain 01/06/2014  . Shock ([place]) 08/08/2013  . 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 Dysarthria 12/30/2012  . Benign paroxysmal positional vertigo 12/30/2012  . Breast cancer of upper-outer quadrant of right female breast ([place]) 11/18/2012  . Hemorrhage of rectum and anus 09/28/2012  . Hx of radiation therapy   . Syncope and collapse 06/04/2011  . HSV 10/05/2008  . Mild persistent asthma 06/02/2008  . Dyspnea 07/19/2007  . HLD (hyperlipidemia) 06/02/2006  . Sinus tachycardia 05/25/2006  . Hypothyroidism 02/27/2006  . Depression 02/27/2006  . Essential hypertension 02/27/2006  . GERD 02/27/2006  . KNEE PAIN 02/27/2006    Past Surgical History:  Procedure Laterality Date  . ABDOMINAL HYSTERECTOMY  1979?   partial  . benign cyst  Left    removed from left breast  . BREAST BIOPSY Right 02/13/2012  . BREAST LUMPECTOMY  03/03/2012   Procedure: LUMPECTOMY;  Surgeon: Haywood Lasso, MD;  Location: [facility];  Service: General;  Laterality: Right;  . BREAST SURGERY Right 01/2012   "cancer"  . CHOLECYSTECTOMY  1980's?  . COLONOSCOPY WITH PROPOFOL N/A 09/28/2012   Procedure: COLONOSCOPY WITH PROPOFOL;  Surgeon: Lear Ng, MD;  Location: [facility];  Service: Endoscopy;  Laterality: N/A;  . FOOT FRACTURE SURGERY Right 1990's  . JOINT REPLACEMENT      x 3  . LUMBAR LAMINECTOMY/DECOMPRESSION MICRODISCECTOMY N/A 12/05/2016   Procedure: LEFT L5-S1  MICRODISCECTOMY;  Surgeon: Marybelle Killings, MD;  Location: [facility];  Service: Orthopedics;  Laterality: N/A;  . REVISION TOTAL KNEE ARTHROPLASTY  07/2009  . SHOULDER OPEN ROTATOR CUFF REPAIR Left 1990's  . THYROIDECTOMY  1970's  . TOTAL KNEE ARTHROPLASTY Bilateral 2009-06/2009   left; right     OB History   No obstetric history on file.     Family History  Problem Relation Age of Onset  . Heart disease Brother        Multiple MIs, starting in his 10s  . Diabetes Brother   . Thyroid disease Brother   . Hypertension Mother   . Diabetes Mother   . Breast cancer Mother 14  . Bone cancer Mother   . Hypertension Sister   .  Diabetes Sister   . Breast cancer Sister 39  . Thyroid disease Sister   . Breast cancer Maternal Grandmother   . Heart disease Maternal Grandmother   . Uterine cancer Other 19  . Breast cancer Paternal Aunt 40  . Breast cancer Paternal Grandmother        dx in her 83s  . Prostate cancer Paternal Grandfather   . Asthma Son     Social History   Tobacco Use  . Smoking status: Former Smoker    Packs/day: 1.00    Years: 7.00    Pack years: 7.00    Types: Cigarettes    Quit date: 03/24/1992    Years since quitting: 27.0  . Smokeless tobacco: Never Used  Substance Use Topics  . 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 Drug use: No    Home Medications Prior to Admission medications   Medication Sig Start Date End Date Taking? Authorizing Provider  albuterol (PROAIR HFA) 108 (90 Base) MCG/ACT inhaler Inhale 2 puffs into the lungs every 4 (four) hours as needed for wheezing or shortness of breath.     [provider]  aspirin EC 81 MG tablet Take 1 tablet (81 mg total) by mouth daily. 08/12/13   Viyuoh, Alison Stalling, MD  B Complex-C (B-COMPLEX WITH VITAMIN C) tablet Take 1 tablet by mouth daily.    [provider]  budesonide-formoterol (SYMBICORT) 80-4.5 MCG/ACT inhaler Inhale 2 puffs into the lungs 2 (two)  times daily. Patient taking differently: Inhale 1 puff into the lungs 3 (three) times daily. PRN 10/28/16   Tanda Rockers, MD  calcitRIOL (ROCALTROL) 0.25 MCG capsule Take 1 capsule (0.25 mcg total) by mouth 3 (three) times a week. 02/28/19   Renato Shin, MD  Cholecalciferol (VITAMIN D-3) 1000 units CAPS Take 1,000 Units by mouth daily.    [provider]  Cyanocobalamin (VITAMIN B-12 PO) Take 1 tablet by mouth daily.    [provider]  diphenhydrAMINE (BENADRYL) 25 MG tablet Take 50 mg by mouth every 6 (six) hours as needed for itching or allergies.    [provider]  doxepin (SINEQUAN) 25 MG capsule Take 50 mg by mouth at bedtime.    [provider]  esomeprazole (NEXIUM) 40 MG capsule Take 1 capsule (40 mg total) by mouth daily before breakfast. Patient taking differently: Take 40 mg by mouth daily at 12 noon.  10/30/14   Barton Dubois, MD  furosemide (LASIX) 20 MG tablet Take 1 tablet (20 mg total) by mouth daily. Patient taking differently: Take 40 mg by mouth daily.  12/17/18   Renato Shin, MD  insulin lispro (HUMALOG KWIKPEN) 100 UNIT/ML KwikPen Inject 0.1 mLs (10 Units total) into the skin 3 (three) times daily with meals. 03/23/19   Renato Shin, MD  Insulin Pen Needle (SURE COMFORT PEN NEEDLES) 32G X 6 MM MISC 1 each by Does not apply route 4 (four) times daily. E11.9 02/25/19   Renato Shin, MD  Iron-Vitamins (GERITOL PO) Take 1 tablet by mouth daily.    [provider]  JANUMET 50-500 MG tablet Take 1 tablet by mouth daily with breakfast. 03/23/19   Renato Shin, MD  Lancets [facility] DELICA PLUS [id number]) [place] 1 each by Other route 3 (three) times daily. Use to check blood sugar 3 times a day 02/16/19   Renato Shin, MD  LEVEMIR FLEXTOUCH 100 UNIT/ML Pen Inject 10 Units into the skin at bedtime.  07/17/18   Renato Shin, MD  levothyroxine (SYNTHROID) 175 MCG tablet Take 1 tablet (175 mcg total) by mouth daily before breakfast. 11/24/18    Renato Shin, MD  linaclotide [facility]) 290 MCG CAPS capsule Take 290 mcg by mouth daily before breakfast.    [provider]  LORazepam (ATIVAN) 0.5 MG tablet Take 0.5 mg by mouth 3 (three) times daily.    [provider]  losartan-hydrochlorothiazide (HYZAAR) 100-12.5 MG tablet Take 1 tablet by mouth daily.    [provider]  methocarbamol (ROBAXIN) 500 MG tablet Take 1 tablet (500 mg total) by mouth every 8 (eight) hours as needed for muscle spasms. 02/13/17   Leandrew Koyanagi, MD  Multiple Vitamin (MULTIVITAMIN WITH MINERALS) TABS tablet Take 1 tablet by mouth daily.    [provider]  [facility] VERIO test strip TEST BLOOD SUGAR THREE TIMES DAILY 03/17/19   Renato Shin, MD  oxyCODONE (ROXICODONE) 15 MG immediate release tablet 15 mg every 6 (six) hours as needed.  11/16/17   [provider]  predniSONE (DELTASONE) 10 MG tablet Take 1 tablet (10 mg total) by mouth daily with breakfast. 01/26/19   Persons, Bevely Palmer, PA  pregabalin (LYRICA) 300 MG capsule Take 300 mg by mouth 2 (two) times daily.    [provider]  rosuvastatin (CRESTOR) 20 MG tablet Take 20 mg by mouth daily.    [provider]  tiZANidine (ZANAFLEX) 4 MG tablet Take 4 mg by mouth 2 (two) times daily.  03/25/16   [provider]  valACYclovir (VALTREX) 1000 MG tablet Take 1,000 mg by mouth 2 (two) times daily.     [provider]  venlafaxine XR (EFFEXOR-XR) 150 MG 24 hr capsule Take 150 mg by mouth 2 (two) times daily. 05/21/15   [provider]  ziprasidone (GEODON) 60 MG capsule Take 60 mg by mouth daily.    [provider]    Allergies    Bee venom, Invokana [canagliflozin], Shellfish allergy, Buprenorphine hcl, and Other  Review of Systems   Review of Systems  Constitutional: Positive for activity change, appetite change, fatigue and fever.  HENT: Positive for congestion and rhinorrhea.   Respiratory: Positive for cough and  shortness of breath.   Cardiovascular: Positive for leg swelling. Negative for chest pain.  Gastrointestinal: Positive for abdominal pain, diarrhea and nausea. Negative for blood in stool and vomiting.  Genitourinary: Positive for difficulty urinating. Negative for dysuria and frequency.  Musculoskeletal: Positive for back pain (chronic).  Neurological: Negative for headaches.  All other systems reviewed and are negative.   Physical Exam Updated Vital Signs BP (!) 153/85 (BP Location: Left Arm)   Pulse (!) 105   Temp 100 F (37.8 C) (Oral)   Resp (!) 22   SpO2 97%   Physical Exam Vitals and nursing note reviewed.  Constitutional:      General: She is not in acute distress.    Appearance: She is obese. She is not ill-appearing.     Comments: Calm, cooperative. Appears fatigued and is moaning in pain  HENT:     Head: Normocephalic and atraumatic.  Eyes:     General: No scleral icterus.       Right eye: No discharge.        Left eye: No discharge.     Conjunctiva/sclera: Conjunctivae normal.     Pupils: Pupils are equal, round, and reactive to light.  Cardiovascular:     Rate and Rhythm: Normal rate and regular  rhythm.  Pulmonary:     Effort: Pulmonary effort is normal. No respiratory distress.     Breath sounds: Normal breath sounds.     Comments: Limited due to body habitus Abdominal:     General: There is no distension.     Palpations: Abdomen is soft.     Tenderness: There is no abdominal tenderness.  Musculoskeletal:     Cervical back: Normal range of motion.     Right lower leg: Edema present.     Left lower leg: Edema present.  Skin:    General: Skin is warm and dry.  Neurological:     Mental Status: She is alert and oriented to person, place, and time.  Psychiatric:        Behavior: Behavior normal.     ED Results / Procedures / Treatments   Labs (all labs ordered are listed, but only abnormal results are displayed) Labs Reviewed  COMPREHENSIVE  METABOLIC PANEL  CBC WITH DIFFERENTIAL/PLATELET  LIPASE, BLOOD  URINALYSIS, ROUTINE W REFLEX MICROSCOPIC  POC SARS CORONAVIRUS 2 AG -  ED    EKG None  Radiology No results found.  Procedures Procedures (including critical care time)  Medications Ordered in ED Medications  oxyCODONE (Oxy IR/ROXICODONE) immediate release tablet 15 mg (has no administration in time range)  acetaminophen (TYLENOL) tablet 650 mg (has no administration in time range)    ED Course  I have reviewed the triage vital signs and the nursing notes.  Pertinent labs & imaging results that were available during my care of the patient were reviewed by me and considered in my medical decision making (see chart for details).  64 year old female presents with fever, shortness of breath, URI symptoms, nausea, diarrhea.  Patient has a temp of 100 here and is mildly tachycardic and tachypneic.  Blood pressure is elevated.  O2 sats are normal.  On exam she is fatigued and uncomfortable appearing.  She is asking for her home dose of oxycodone.  Heart rate is mildly elevated but regular.  Lungs are clear to auscultation.  Abdomen is soft and nontender.  She has diffuse lower extremity edema.  She states that the right side has been worse recently.  Will obtain labs, urine, rapid Covid, CXR, EKG, DVT study.  EKG is sinus tachycardia with low voltage.  Rapid Covid is negative.  At shift change imaging and labs are pending. Will give 500cc bolus due to poor PO intake, decreased urination, and mild tachycardia. Care was signed out to Tammie Singh PA-C    MDM Rules/Calculators/A&P                       Final Clinical Impression(s) / ED Diagnoses Final diagnoses:  Fever, unspecified fever cause    Rx / DC Orders ED Discharge Orders    None       Recardo Evangelist, PA-C 04/20/19 1514    Fredia Sorrow, MD 04/24/19 [phone]

## 2019-04-20 NOTE — ED Notes (Signed)
Pt ambulated around the room with assistance. Pulse oxygen dropped to 94%. Pt struggled because of chronic back pain.

## 2019-04-20 NOTE — ED Triage Notes (Signed)
Per [facility] pt from home. Home health aid called ems, stated pt usually urinates every 30 min, has not been urinating normally for last 2-3 days even with lasix. Pt had fever of 101 for last 2 days, says it broke today no fever with ems. Pt says she has been wearing her cpap non-stop for last 3 days because she cannot catch her breath. Pt put on 4L [place] by ems for comfort

## 2019-04-20 NOTE — Discharge Instructions (Addendum)
Please read and follow all provided instructions.  Your diagnoses today include:  1. Shortness of breath   2. Lower extremity edema     Tests performed today include:  Chest x-ray -showed the possibility of some fluid, no pneumonia  Blood counts and electrolytes  Fluid test for heart failure -was normal  Lower extremity ultrasound -was limited but did not show any obvious blood clots  Vital signs. See below for your results today.   Medications prescribed:   None  Take any prescribed medications only as directed.  Home care instructions:  Follow any educational materials contained in this packet.  Follow-up instructions: Please follow-up with your primary care provider in the next 2 days for further evaluation of your symptoms.   Return instructions:   Please return to the Emergency Department if you experience worsening symptoms.   Please return if you develop fever greater than 101 F, have worsening shortness of breath or trouble breathing, chest pain, worsening leg swelling or other concerns.  Please return if you have any other emergent concerns.  Additional Information:  Your vital signs today were: BP 126/75 (BP Location: Left Arm)   Pulse 86   Temp 99.2 F (37.3 C) (Oral)   Resp 20   SpO2 95%  If your blood pressure (BP) was elevated above 135/85 this visit, please have this repeated by your doctor within one month. --------------

## 2019-05-04 IMAGING — CT CT PELVIS W/O CM
2 of 4 series · 14 of 46 positions shown, 16 images · non-contrast
Comparison: Lumbar radiographs November 06, 2017; CT abdomen and
pelvis with bony reformats April 02, 2016

CLINICAL DATA: Pain following fall

EXAM:
CT PELVIS WITHOUT CONTRAST
TECHNIQUE: Multidetector CT imaging of the pelvis was performed following the
standard protocol without intravenous contrast.

[Series 5: pelvis 2.0 st · axial · 0.75mm/px · z∈[+748,+922]mm · 11 of 105 slices shown, 13 images]
[im 9/105  soft-tissue]
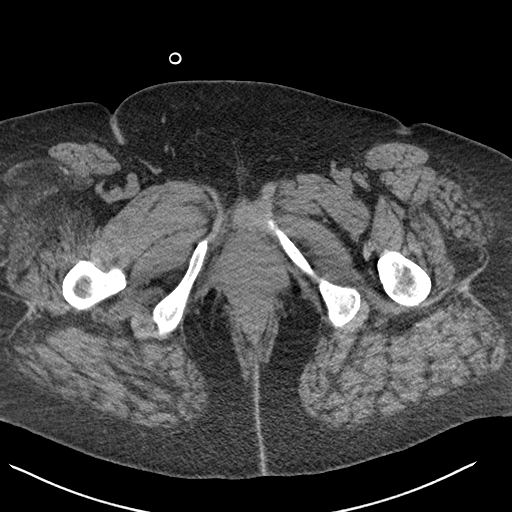
[im 9/105  bone]
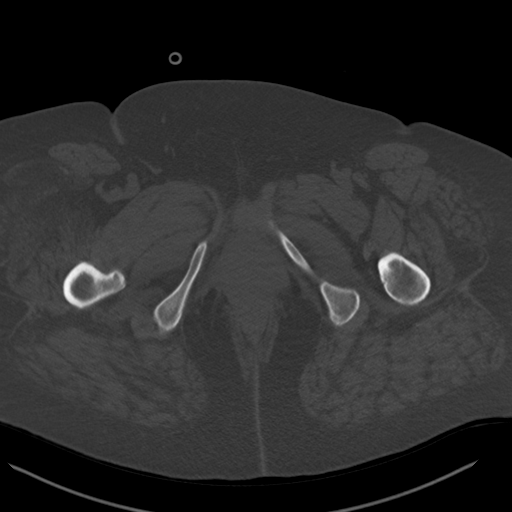
[im 17/105  soft-tissue]
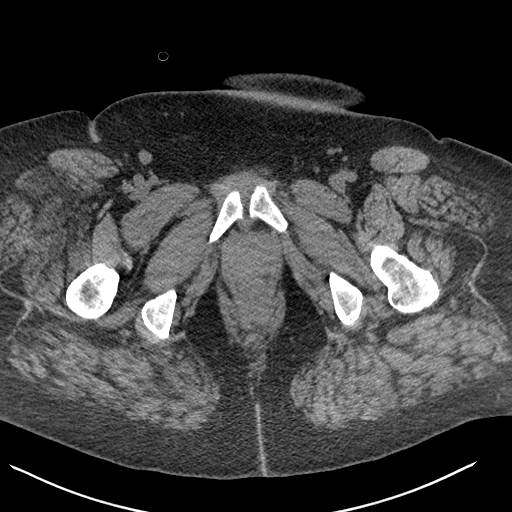
[im 25/105  soft-tissue]
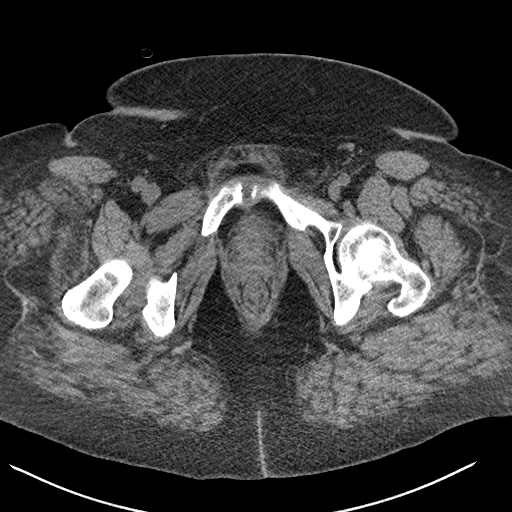
[im 34/105  soft-tissue]
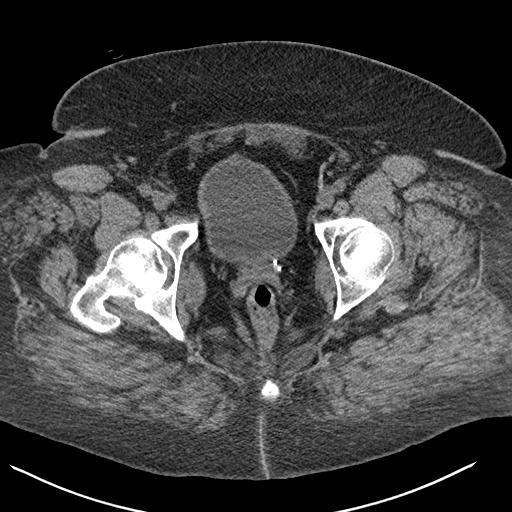
[im 42/105  soft-tissue]
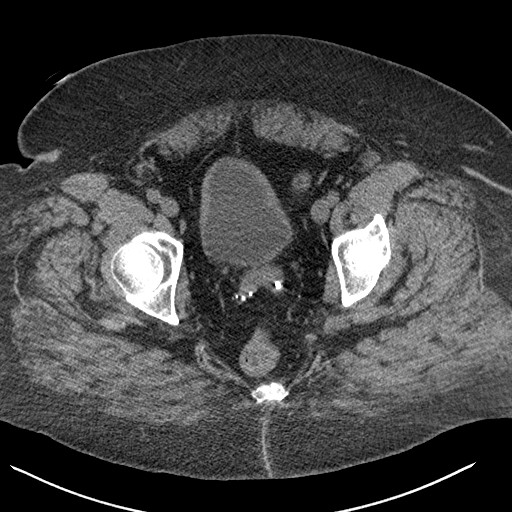
[im 55/105  soft-tissue]
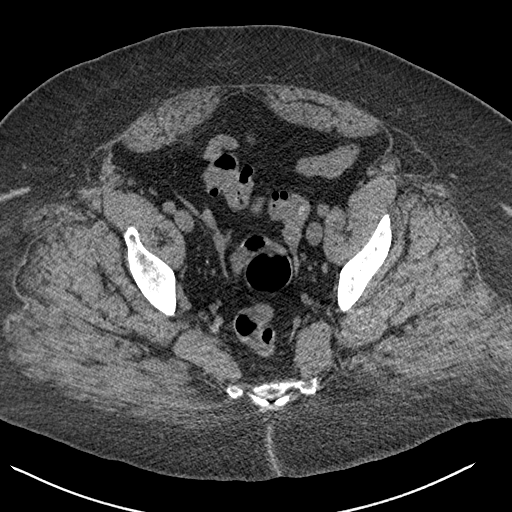
[im 63/105  soft-tissue]
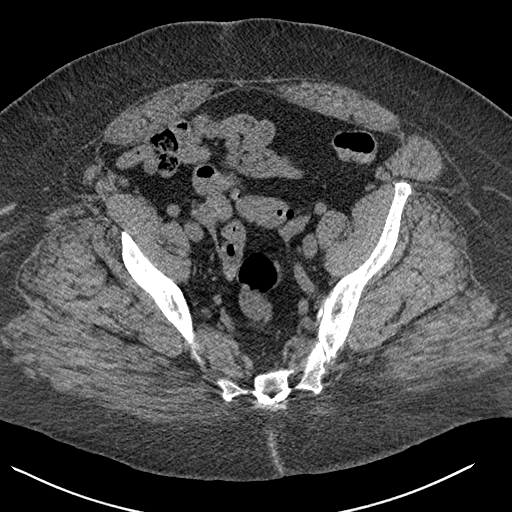
[im 71/105  soft-tissue]
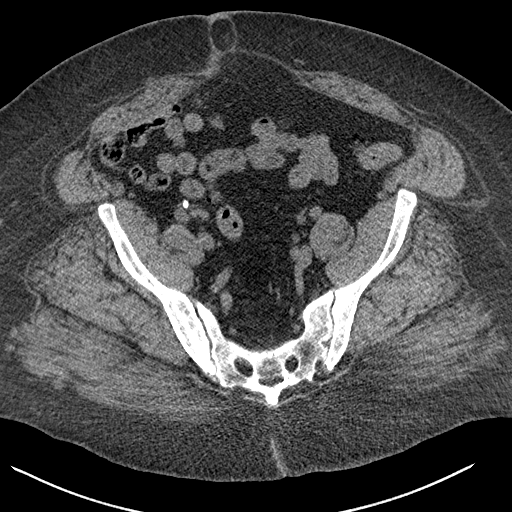
[im 80/105  soft-tissue]
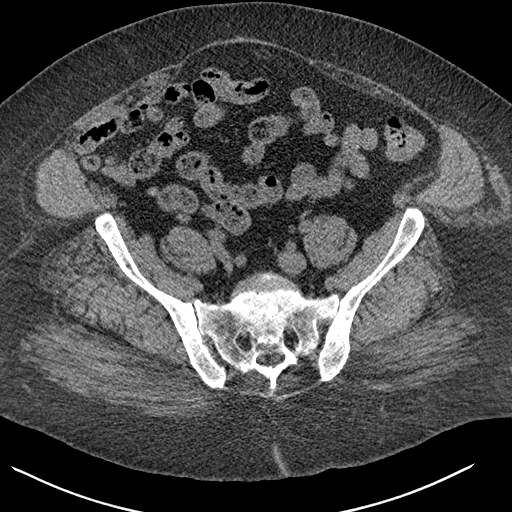
[im 80/105  bone]
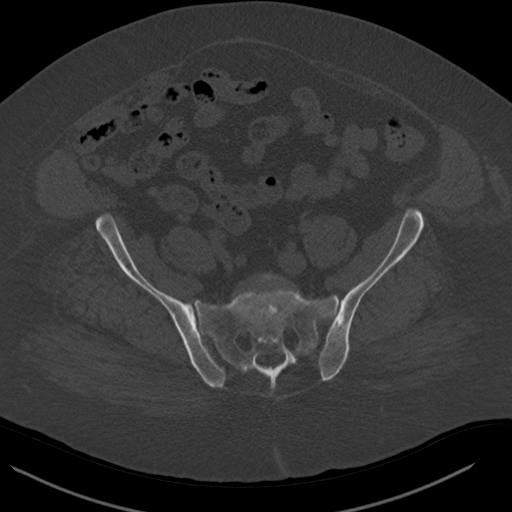
[im 88/105  soft-tissue]
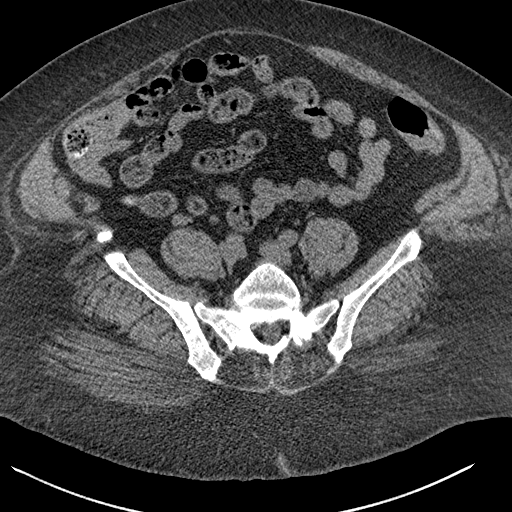
[im 96/105  soft-tissue]
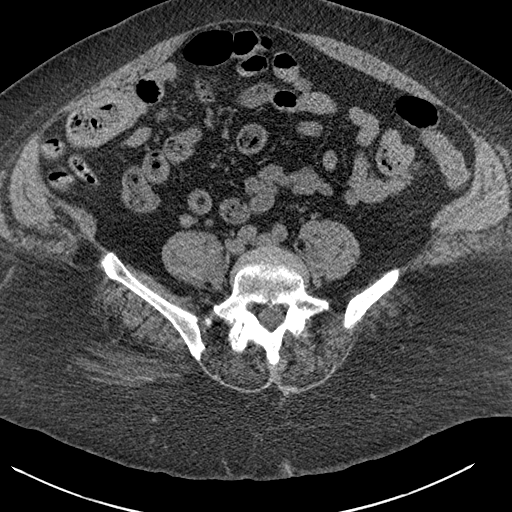

[Series 6: pelvis 2.0 cor. bone · coronal · 0.41mm/px · 3 of 189 slices shown]
[im 63/189  soft-tissue]
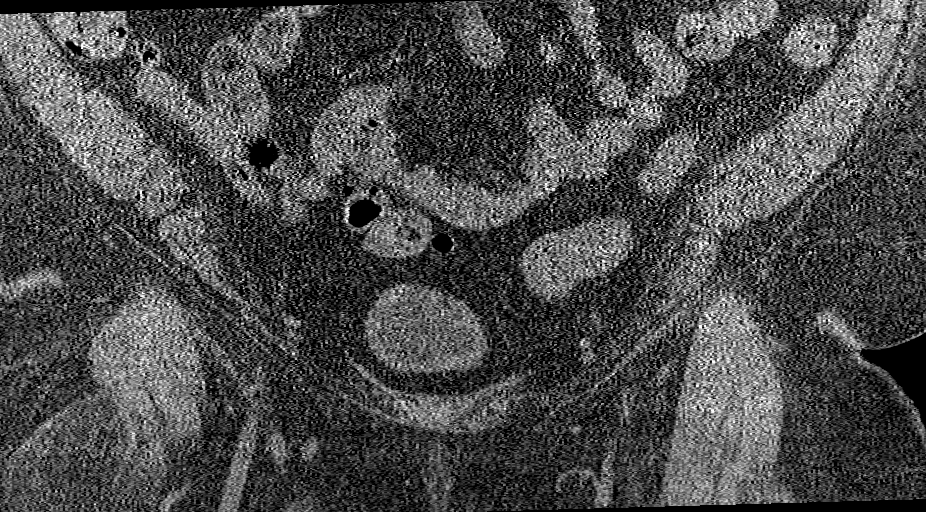
[im 84/189  soft-tissue]
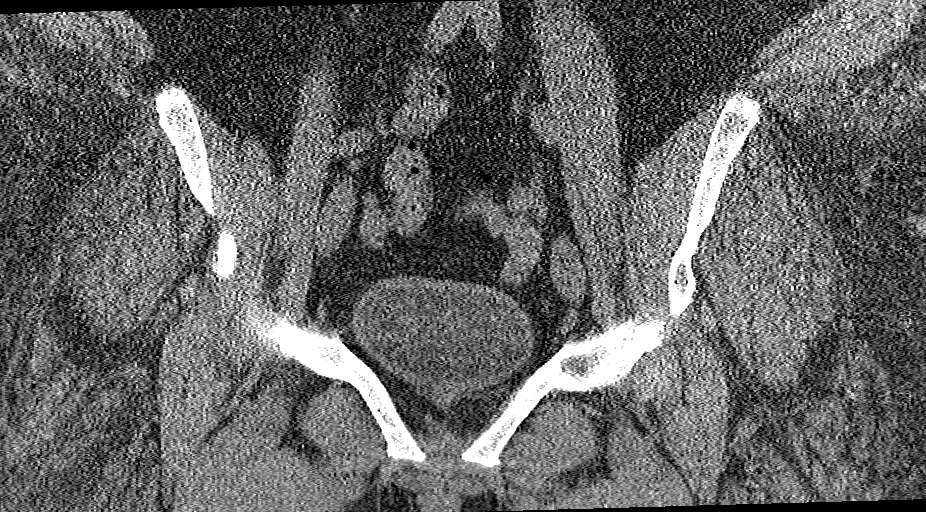
[im 105/189  soft-tissue]
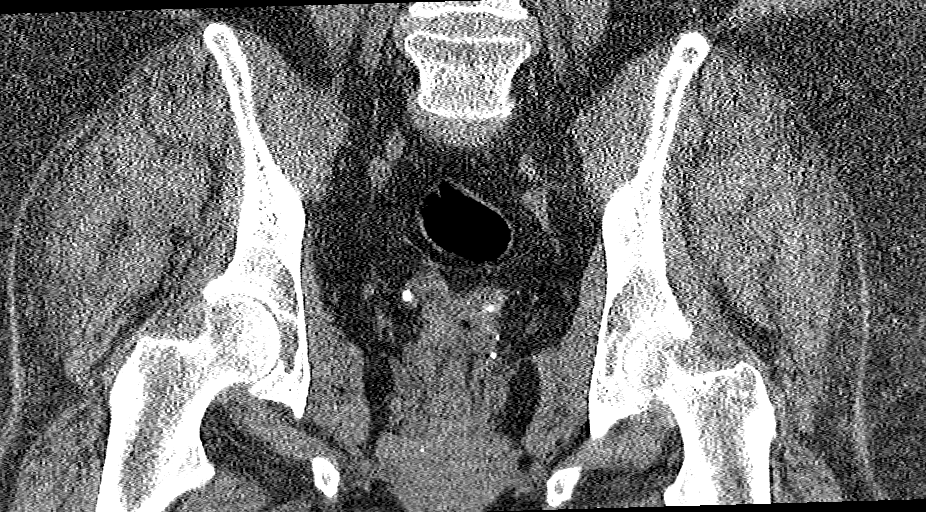

[14 of 46 positions shown; findings below may reference images not displayed]

FINDINGS: Urinary Tract: Urinary bladder is midline with wall thickness within
normal limits. Visualized ureters appear normal in size and contour.
No ureteral calculi evident in visualized regions.

Bowel:  No evident bowel obstruction or bowel wall thickening.

Vascular/Lymphatic: There are phleboliths in the pelvis. Major
arterial vessels appear patent on this noncontrast enhanced study.
No aneurysm evident. No evident pelvic adenopathy.

Reproductive:  Uterus absent.  No pelvic mass evident.

Other: There is a small ventral hernia in the upper pelvic region
containing only fat. Appendix appears normal. No abscess or ascites
evident in the pelvic region.

Musculoskeletal: There is a small avulsion type fracture along the
anterior most aspect of the S1 vertebral body. No other fracture is
evident. No evident diastasis. There is vacuum phenomenon at L5-S1.
There is facet osteoarthritic change at L4-5 and L5-S1 bilaterally.
No intramuscular lesions are evident. No hip joint effusions
appreciable.
IMPRESSION: 1. Small avulsion type fracture along the anterior most aspect of
the S1 vertebral body. No other evident fracture. No diastases.

2.  Degenerative change in the visualized lower lumbar spine.

3.  No abnormal fluid in the pelvis.  No inflammatory focus.

4. Small midline ventral hernia in the upper pelvic region
containing only fat.
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
## 2019-05-06 ENCOUNTER — Other Ambulatory Visit: Payer: Self-pay

## 2019-05-06 DIAGNOSIS — Z794 Long term (current) use of insulin: Secondary | ICD-10-CM

## 2019-05-06 DIAGNOSIS — E118 Type 2 diabetes mellitus with unspecified complications: Secondary | ICD-10-CM

## 2019-05-06 MED ORDER — SURE COMFORT PEN NEEDLES 32G X 6 MM MISC: 1.0000 | Freq: Four times a day (QID) | 1 refills | Status: DC

## 2019-05-11 ENCOUNTER — Other Ambulatory Visit: Payer: Self-pay | Admitting: Endocrinology

## 2019-05-11 DIAGNOSIS — N2581 Secondary hyperparathyroidism of renal origin: Secondary | ICD-10-CM

## 2019-05-16 ENCOUNTER — Other Ambulatory Visit: Payer: Self-pay | Admitting: Endocrinology

## 2019-06-09 ENCOUNTER — Other Ambulatory Visit: Payer: Self-pay

## 2019-06-09 ENCOUNTER — Ambulatory Visit (INDEPENDENT_AMBULATORY_CARE_PROVIDER_SITE_OTHER): Payer: Medicare Other | Admitting: Family Medicine

## 2019-06-09 ENCOUNTER — Encounter: Payer: Self-pay | Admitting: Family Medicine

## 2019-06-09 DIAGNOSIS — R413 Other amnesia: Secondary | ICD-10-CM

## 2019-06-09 DIAGNOSIS — R269 Unspecified abnormalities of gait and mobility: Secondary | ICD-10-CM

## 2019-06-09 NOTE — Progress Notes (Signed)
PATIENT: [name] DOB: 05/14/1955  REASON FOR VISIT: follow up HISTORY FROM: patient  Chief Complaint  Patient presents with  . Follow-up    gait abnormality , obesity , alone, rm 6     HISTORY OF PRESENT ILLNESS: Today 06/09/19 [name] is a 64 y.o. female here today for follow up for obesity. She has not been able to lose weight. She continues to have chronic pain. Pain management continues to adjust regimen. She is taking multiple pain agents and reports that pain is still unmanaged. She does not get around well. She can walk with walker. She feels that pain is disabling. Memory is still fairly poor but she feels it is stable. She feels memory concerns are related to pain. She lives alone. She is able to manage finances and medications. She has an aid that comes to help her daily. She doses her own medications. She denies falls. She did not go to see Dr Leafy Ro at [facility].   HISTORY: (copied from my note on 06/09/2018)  [name] is a 64 y.o. female for follow up of memory loss and impaired gait. She has a history of DMT2,TIA, OSA on CPAP (noncompliant) and skizophrenia. She was educated on relationship of obesity and symptoms at last visit. Phentermine started for weight loss. She reports that she does not feel that her insurance would cover this medication.  She never picked up the prescription.  She states that she has tried portion control.  She has limited herself to 1 piece of meat per meal.  She still feels hungry but continues to monitor portions.  On 04/20/2018 she weighed 314. She was 334 at her last visit with Dr Jannifer Franklin in 12/2017.  She is not interested in bariatric surgery at this time.  She is interested in healthy weight loss options, however, she does not know where to start.  She is very skeptical of weight loss medications due to how they make her feel.  She is also concerned about possible medication interactions with medication she is taking  daily.  She is on 175 mcg of Synthroid daily.  She is seeing her primary care provider regularly.   History (copied from Dr Jannifer Franklin' note on 12/22/2017)  [name] is a 64 year old right-handed black female with a history of morbid obesity and a history of schizophrenia. The patient has chronic low back pain, she has had lumbosacral spine surgery previously. She is currently followed through [facility] for her pain control, she is on opiate medications currently. The patient unfortunately sustained a fall while getting into the shower on 06 November 2017. The patient had an avulsion fracture of the S1 vertebral body, she has been seen by Dr. Lorin Mercy, surgery was not recommended. The patient has pain in the back and pain down both legs. She continues to have some mild problems with memory, she is not sleeping well at night, she is noncompliant with her CPAP, she claims that she continues to forget to use it. The patient has an Environmental consultant who comes into help her daily. The patient now has pain at all times with standing, sitting, and with lying down. She has lost about 10 pounds since last seen, we have emphasized that her medical issues are tied in with her weight, if she can lose weight she will have improved diabetic control, lower back pain, better balance, better sleep, and better memory. She returns for an evaluation. It is not clear that anyone has  ever discussed the possibility of bariatric surgery with her.In the past, the patient has had several episodes of altered mental status associated with polypharmacy, concurrent use of opiates and benzodiazepines.   REVIEW OF SYSTEMS: Out of a complete 14 system review of symptoms, the patient complains only of the following symptoms, chronic pain, difficulty walking, memory loss and all other reviewed systems are negative.  ALLERGIES: Allergies  Allergen Reactions  . Bee Venom Anaphylaxis  . Invokana [Canagliflozin] Anaphylaxis      Dyspnea and urinary retention  . Shellfish Allergy Anaphylaxis  . Buprenorphine Hcl Other (See Comments)    Overly sedated with morphine drip.  . Other Other (See Comments)    Cats - bad asthma, stopped up    HOME MEDICATIONS: Outpatient Medications Prior to Visit  Medication Sig Dispense Refill  . albuterol (PROAIR HFA) 108 (90 Base) MCG/ACT inhaler Inhale 2 puffs into the lungs every 4 (four) hours as needed for wheezing or shortness of breath.     [name] aspirin EC 81 MG tablet Take 1 tablet (81 mg total) by mouth daily. 30 tablet 0  . B Complex-C (B-COMPLEX WITH VITAMIN C) tablet Take 1 tablet by mouth daily.    . budesonide-formoterol (SYMBICORT) 80-4.5 MCG/ACT inhaler Inhale 2 puffs into the lungs 2 (two) times daily. (Patient taking differently: Inhale 1 puff into the lungs 3 (three) times daily. PRN) 1 Inhaler 11  . calcitRIOL (ROCALTROL) 0.25 MCG capsule Take 1 capsule (0.25 mcg total) by mouth 3 (three) times a week. 12 capsule 2  . Cholecalciferol (VITAMIN D-3) 1000 units CAPS Take 1,000 Units by mouth daily.    . Cyanocobalamin (VITAMIN B-12 PO) Take 1 tablet by mouth daily.    . diphenhydrAMINE (BENADRYL) 25 MG tablet Take 50 mg by mouth every 6 (six) hours as needed for itching or allergies.    [name] doxepin (SINEQUAN) 25 MG capsule Take 50 mg by mouth at bedtime.    [name] esomeprazole (NEXIUM) 40 MG capsule Take 1 capsule (40 mg total) by mouth daily before breakfast. (Patient taking differently: Take 40 mg by mouth daily at 12 noon. ) 30 capsule 1  . furosemide (LASIX) 20 MG tablet Take 1 tablet (20 mg total) by mouth daily. (Patient taking differently: Take 40 mg by mouth daily. ) 90 tablet 1  . HUMALOG KWIKPEN 100 UNIT/ML KwikPen INJECT 10 UNITS THREE TIMES DAILY 15 mL 4  . Insulin Pen Needle (SURE COMFORT PEN NEEDLES) 32G X 6 MM MISC 1 each by Does not apply route 4 (four) times daily. E11.9 200 each 1  . Iron-Vitamins (GERITOL PO) Take 1 tablet by mouth daily.    [name] JANUMET 50-500  MG tablet Take 1 tablet by mouth daily with breakfast. 30 tablet 11  . Lancets (ONETOUCH DELICA PLUS [id number]) MISC 1 each by Other route 3 (three) times daily. Use to check blood sugar 3 times a day 300 each 11  . LEVEMIR FLEXTOUCH 100 UNIT/ML Pen Inject 10 Units into the skin at bedtime. (Patient taking differently: Inject 10 Units into the skin at bedtime. ) 15 mL 4  . levothyroxine (SYNTHROID) 175 MCG tablet Take 1 tablet (175 mcg total) by mouth daily before breakfast. 90 tablet 3  . linaclotide (LINZESS) 290 MCG CAPS capsule Take 290 mcg by mouth daily before breakfast.    . LORazepam (ATIVAN) 0.5 MG tablet Take 0.5 mg by mouth 3 (three) times daily.    [name] losartan-hydrochlorothiazide (HYZAAR) 100-12.5 MG tablet Take 1 tablet by  mouth daily.    . methocarbamol (ROBAXIN) 500 MG tablet Take 1 tablet (500 mg total) by mouth every 8 (eight) hours as needed for muscle spasms. 30 tablet 0  . Multiple Vitamin (MULTIVITAMIN WITH MINERALS) TABS tablet Take 1 tablet by mouth daily.    Glory Rosebush VERIO test strip TEST BLOOD SUGAR THREE TIMES DAILY 100 strip 11  . oxyCODONE (ROXICODONE) 15 MG immediate release tablet Take 15 mg by mouth every 6 (six) hours as needed for pain.     . predniSONE (DELTASONE) 10 MG tablet Take 1 tablet (10 mg total) by mouth daily with breakfast. 30 tablet 1  . pregabalin (LYRICA) 300 MG capsule Take 300 mg by mouth 2 (two) times daily.    . rosuvastatin (CRESTOR) 20 MG tablet Take 20 mg by mouth daily.    [name] tiZANidine (ZANAFLEX) 4 MG tablet Take 4 mg by mouth 2 (two) times daily.     . valACYclovir (VALTREX) 1000 MG tablet Take 1,000 mg by mouth 2 (two) times daily.     [name] venlafaxine XR (EFFEXOR-XR) 150 MG 24 hr capsule Take 150 mg by mouth 2 (two) times daily.    . ziprasidone (GEODON) 60 MG capsule Take 60 mg by mouth daily.     No facility-administered medications prior to visit.    PAST MEDICAL HISTORY: Past Medical History:  Diagnosis Date  . Anemia   . Anxiety    . Arthritis    "pretty much all my joints"  . Asthma   . Benign paroxysmal positional vertigo 12/30/2012  . Breast cancer ([place]) 02/13/12   ruq  100'clock bx Ductal Carcinoma in Situ,(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  . Breast mass in female    L breast 2008, US showed likely fat necrosis  . Chronic lower back pain   . CKD (chronic kidney disease), stage III    "lower stage" (01/06/2014)  . Daily headache    last time was 2016/09/11 (brother passed away)  . Depression   . Dyspnea   . Gait abnormality 05/29/2016  . GERD (gastroesophageal reflux disease)   . History of blood transfusion 2011   "after one of my OR's"  . History of hiatal hernia   . History of stomach ulcers   . HSV (herpes simplex virus) infection   . Hx of radiation therapy 05/05/12- 07/15/12   right breast, 45 gray x 25 fx, lumpectomy cavity boosted to 16.2 gray  . Hyperlipemia   . Hypertension    sees Dr. Criss Rosales , [facility]  . Hypothyroidism   . Kidney stones   . Knee pain, bilateral   . Memory difficulty 12/22/2017  . Migraines    "~ 3 times/month" (01/06/2014)  . Obesity   . OSA on CPAP    pt does not know settings  . Pancreatitis    hx of  . Personal history of radiation therapy 02/2012  . Pneumonia 1950's  . Polyneuropathy in diabetes(357.2)   . Schizophrenia ([place])   . 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 Tachycardia    with sx in 2008  . Thyroid disease   . Type II diabetes mellitus ([place])    dx 2014    PAST SURGICAL HISTORY: Past Surgical History:  Procedure Laterality Date  . ABDOMINAL HYSTERECTOMY  1979?   partial  . benign cyst  Left    removed from left breast  . BREAST BIOPSY Right 02/13/2012  . BREAST LUMPECTOMY  03/03/2012   Procedure: LUMPECTOMY;  Surgeon: Haywood Lasso,  MD;  Location: [facility];  Service: General;  Laterality: Right;  . BREAST SURGERY Right 01/2012   "cancer"  . CHOLECYSTECTOMY  1980's?  . COLONOSCOPY WITH PROPOFOL N/A 09/28/2012   Procedure: COLONOSCOPY WITH  PROPOFOL;  Surgeon: Lear Ng, MD;  Location: [facility];  Service: Endoscopy;  Laterality: N/A;  . FOOT FRACTURE SURGERY Right 1990's  . JOINT REPLACEMENT      x 3  . LUMBAR LAMINECTOMY/DECOMPRESSION MICRODISCECTOMY N/A 12/05/2016   Procedure: LEFT L5-S1 MICRODISCECTOMY;  Surgeon: Marybelle Killings, MD;  Location: [facility];  Service: Orthopedics;  Laterality: N/A;  . REVISION TOTAL KNEE ARTHROPLASTY  07/2009  . SHOULDER OPEN ROTATOR CUFF REPAIR Left 1990's  . THYROIDECTOMY  1970's  . TOTAL KNEE ARTHROPLASTY Bilateral 2009-06/2009   left; right    FAMILY HISTORY: Family History  Problem Relation Age of Onset  . Heart disease Brother        Multiple MIs, starting in his 40s  . Diabetes Brother   . Thyroid disease Brother   . Hypertension Mother   . Diabetes Mother   . Breast cancer Mother 93  . Bone cancer Mother   . Hypertension Sister   . Diabetes Sister   . Breast cancer Sister 99  . Thyroid disease Sister   . Breast cancer Maternal Grandmother   . Heart disease Maternal Grandmother   . Uterine cancer Other 19  . Breast cancer Paternal Aunt 30  . Breast cancer Paternal Grandmother        dx in her 73s  . Prostate cancer Paternal Grandfather   . Asthma Son     SOCIAL HISTORY: Social History   Socioeconomic History  . Marital status: Divorced    Spouse name: Not on file  . Number of children: 2  . Years of education: 30  . Highest education level: Not on file  Occupational History  . Occupation: Disabled  Tobacco Use  . Smoking status: Former Smoker    Packs/day: 1.00    Years: 7.00    Pack years: 7.00    Types: Cigarettes    Quit date: 03/24/1992    Years since quitting: 27.2  . Smokeless tobacco: Never Used  Substance and Sexual Activity  . 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 Drug use: No  . Sexual activity: Never    Birth control/protection: Surgical  Other Topics Concern  . Not on file  Social History  Narrative   Single. Disabled Biochemist, clinical.          Patient is right handed.    Denies caffeine use    Social Determinants of Radio broadcast assistant Strain:   . Difficulty of Paying Living Expenses:   Food Insecurity:   . Worried About Charity fundraiser in the Last Year:   . Arboriculturist in the Last Year:   Transportation Needs:   . Film/video editor (Medical):   [name] Lack of Transportation (Non-Medical):   Physical Activity:   . Days of Exercise per Week:   . Minutes of Exercise per Session:   Stress:   . Feeling of Stress :   Social Connections:   . Frequency of Communication with Friends and Family:   . Frequency of Social Gatherings with Friends and Family:   . Attends Religious Services:   . Active Member of Clubs or Organizations:   . Attends Archivist Meetings:   .  Marital Status:   Intimate Partner Violence:   . Fear of Current or Ex-Partner:   . Emotionally Abused:   [name] Physically Abused:   . Sexually Abused:       PHYSICAL EXAM  Vitals:   06/09/19 1513  BP: (!) 160/88  Pulse: (!) 118  Temp: 98.4 F (36.9 C)  Height: _0  (1.626 m)   Body mass index is 64.68 kg/m.  Generalized: Well developed, in no acute distress  Cardiology: normal rate and rhythm, no murmur noted Respiratory: clear to auscultation bilaterally  Neurological examination  Mentation: Alert, very tearful in office, oriented to time, place, history taking. Follows all commands speech and language fluent Cranial nerve II-XII: Pupils were equal round reactive to light. Extraocular movements were full, visual field were full on confrontational test. Facial sensation and strength were normal. Uvula tongue midline. Head turning and shoulder shrug  were normal and symmetric. Motor: The motor testing reveals 5 over 5 strength of bilateral upper extremities, 4/5 bilateral lowers. Good symmetric motor tone is noted throughout.  Sensory: Sensory testing is intact to  soft touch on all 4 extremities. No evidence of extinction is noted.  Coordination: Cerebellar testing reveals good finger-nose-finger and heel-to-shin bilaterally.  Gait and station: Gait is wide. Stable with Rolator. Tandem not attempted.  Reflexes: Deep tendon reflexes are symmetric and normal bilaterally.   DIAGNOSTIC DATA (LABS, IMAGING, TESTING) - I reviewed patient records, labs, notes, testing and imaging myself where available.  MMSE - Mini Mental State Exam 12/22/2017  Orientation to time 4  Orientation to Place 5  Registration 3  Attention/ Calculation 5  Recall 3  Language- name 2 objects 2  Language- repeat 1  Language- follow 3 step command 2  Language- read & follow direction 1  Write a sentence 1  Copy design 1  Total score 28     Lab Results  Component Value Date   WBC 7.1 04/20/2019   HGB 11.2 (L) 04/20/2019   HCT 35.0 (L) 04/20/2019   MCV 87.9 04/20/2019   PLT 223 04/20/2019      Component Value Date/Time   NA 135 04/20/2019 1552   NA 137 08/04/2017 0809   NA 137 03/26/2015 0934   K 4.4 04/20/2019 1552   K 4.4 03/26/2015 0934   CL 99 04/20/2019 1552   CL 115 (H) 11/15/2013 0500   CL 104 04/13/2012 1609   CO2 27 04/20/2019 1552   CO2 20 (L) 03/26/2015 0934   GLUCOSE 154 (H) 04/20/2019 1552   GLUCOSE 124 03/26/2015 0934   GLUCOSE 133 (H) 04/13/2012 1609   BUN 17 04/20/2019 1552   BUN 20 08/04/2017 0809   BUN 30.8 (H) 03/26/2015 0934   CREATININE 1.21 (H) 04/20/2019 1552   CREATININE 1.1 03/26/2015 0934   CALCIUM 9.4 04/20/2019 1552   CALCIUM 9.0 03/26/2015 0934   PROT 6.2 (L) 04/20/2019 1552   PROT 6.7 08/04/2017 0809   PROT 6.9 03/26/2015 0934   ALBUMIN 3.5 04/20/2019 1552   ALBUMIN 4.2 08/04/2017 0809   ALBUMIN 3.2 (L) 03/26/2015 0934   AST 19 04/20/2019 1552   AST 13 03/26/2015 0934   ALT 18 04/20/2019 1552   ALT 18 03/26/2015 0934   ALKPHOS 78 04/20/2019 1552   ALKPHOS 84 03/26/2015 0934   BILITOT 0.4 04/20/2019 1552   BILITOT <0.2  08/04/2017 0809   BILITOT <0.30 03/26/2015 0934   GFRNONAA 48 (L) 04/20/2019 1552   GFRNONAA 41 (L) 11/15/2013 0500   GFRAA 56 (  L) 04/20/2019 1552   GFRAA 50 (L) 11/15/2013 0500   Lab Results  Component Value Date   CHOL 111 02/29/2016   HDL 55 02/29/2016   LDLCALC 35 02/29/2016   TRIG 104 02/29/2016   CHOLHDL 2.0 02/29/2016   Lab Results  Component Value Date   HGBA1C 7.1 (A) 03/23/2019   Lab Results  Component Value Date   VITAMINB12 1,236 08/04/2017   Lab Results  Component Value Date   TSH 1.41 12/17/2018       ASSESSMENT AND PLAN 64 y.o. year old female  has a past medical history of Anemia, Anxiety, Arthritis, Asthma, Benign paroxysmal positional vertigo (12/30/2012), Breast cancer ([place]) (02/13/12), Breast mass in female, Chronic lower back pain, CKD (chronic kidney disease), stage III, Daily headache, Depression, Dyspnea, Gait abnormality (05/29/2016), GERD (gastroesophageal reflux disease), History of blood transfusion (2011), History of hiatal hernia, History of stomach ulcers, HSV (herpes simplex virus) infection, radiation therapy (05/05/12- 07/15/12), Hyperlipemia, Hypertension, Hypothyroidism, Kidney stones, Knee pain, bilateral, Memory difficulty (12/22/2017), Migraines, Obesity, OSA on CPAP, Pancreatitis, Personal history of radiation therapy (02/2012), Pneumonia (1950's), Polyneuropathy in diabetes(357.2), Schizophrenia ([place]), 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 here with     ICD-10-CM   1. Obesity, morbid, BMI 50 or higher (HCC)  E66.01   2. Gait abnormality  R26.9   3. Memory difficulty  R41.[place] continues to struggle with chronic pain.  It seems as if pain is not well controlled despite being on oxycodone IR 15 mg every 6 hours, Lyrica 300 mg twice daily, tizanidine 4 mg twice daily, methocarbamol 500 mg every 8 hours as well as a sublingual analgesic that she is unsure of the  name.  We have had a lengthy discussion regarding the need for pain management and the relationship of polypharmacy on memory loss.  She has not been able to lose any weight due to the pain.  I have encouraged her to continue focusing on healthy lifestyle habits with well-balanced diet.  I encouraged exercise when pain is better managed.  She was encouraged to continue close follow-up with primary care and pain management.  I do not feel that neurology has anything further to offer at this time.  She verbalizes understanding and agreement with this plan.   No orders of the defined types were placed in this encounter.    No orders of the defined types were placed in this encounter.     I spent 15 minutes with the patient. 50% of this time was spent counseling and educating patient on plan of care and medications.    Debbora Presto, FNP-C 06/09/2019, 4:46 PM [facility] [address], [place], [place] [address] [phone]

## 2019-06-09 NOTE — Patient Instructions (Signed)
Please follow up closely with PCP and Pain management.   Work on well balanced diet and regular exercise when you can get pain under control.    Chronic Back Pain When back pain lasts longer than 3 months, it is called chronic back pain.The cause of your back pain may not be known. Some common causes include:  Wear and tear (degenerative disease) of the bones, ligaments, or disks in your back.  Inflammation and stiffness in your back (arthritis). People who have chronic back pain often go through certain periods in which the pain is more intense (flare-ups). Many people can learn to manage the pain with home care. Follow these instructions at home: Pay attention to any changes in your symptoms. Take these actions to help with your pain: Activity   Avoid bending and other activities that make the problem worse.  Maintain a proper position when standing or sitting: ? When standing, keep your upper back and neck straight, with your shoulders pulled back. Avoid slouching. ? When sitting, keep your back straight and relax your shoulders. Do not round your shoulders or pull them backward.  Do not sit or stand in one place for long periods of time.  Take brief periods of rest throughout the day. This will reduce your pain. Resting in a lying or standing position is usually better than sitting to rest.  When you are resting for longer periods, mix in some mild activity or stretching between periods of rest. This will help to prevent stiffness and pain.  Get regular exercise. Ask your health care provider what activities are safe for you.  Do not lift anything that is heavier than 10 lb (4.5 kg). Always use proper lifting technique, which includes: ? Bending your knees. ? Keeping the load close to your body. ? Avoiding twisting.  Sleep on a firm mattress in a comfortable position. Try lying on your side with your knees slightly bent. If you lie on your back, put a pillow under your  knees. Managing pain  If directed, apply ice to the painful area. Your health care provider may recommend applying ice during the first 24-48 hours after a flare-up begins. ? Put ice in a plastic bag. ? Place a towel between your skin and the bag. ? Leave the ice on for 20 minutes, 2-3 times per day.  If directed, apply heat to the affected area as often as told by your health care provider. Use the heat source that your health care provider recommends, such as a moist heat pack or a heating pad. ? Place a towel between your skin and the heat source. ? Leave the heat on for 20-30 minutes. ? Remove the heat if your skin turns bright red. This is especially important if you are unable to feel pain, heat, or cold. You may have a greater risk of getting burned.  Try soaking in a warm tub.  Take over-the-counter and prescription medicines only as told by your health care provider.  Keep all follow-up visits as told by your health care provider. This is important. Contact a health care provider if:  You have pain that is not relieved with rest or medicine. Get help right away if:  You have weakness or numbness in one or both of your legs or feet.  You have trouble controlling your bladder or your bowels.  You have nausea or vomiting.  You have pain in your abdomen.  You have shortness of breath or you faint. This information  is not intended to replace advice given to you by your health care provider. Make sure you discuss any questions you have with your health care provider. Document Revised: 05/27/2018 Document Reviewed: 08/13/2016 Elsevier Patient Education  [place].   Obesity, Adult Obesity is having too much body fat. Being obese means that your weight is more than what is healthy for you. BMI is a number that explains how much body fat you have. If you have a BMI of 30 or more, you are obese. Obesity is often caused by eating or drinking more calories than your body  uses. Changing your lifestyle can help you lose weight. Obesity can cause serious health problems, such as:  Stroke.  Coronary artery disease (CAD).  Type 2 diabetes.  Some types of cancer, including cancers of the colon, breast, uterus, and gallbladder.  Osteoarthritis.  High blood pressure (hypertension).  High cholesterol.  Sleep apnea.  Gallbladder stones.  Infertility problems. What are the causes?  Eating meals each day that are high in calories, sugar, and fat.  Being born with genes that may make you more likely to become obese.  Having a medical condition that causes obesity.  Taking certain medicines.  Sitting a lot (having a sedentary lifestyle).  Not getting enough sleep.  Drinking a lot of drinks that have sugar in them. What increases the risk?  Having a family history of obesity.  Being an [place] American woman.  Being a Hispanic man.  Living in an area with limited access to: ? Romilda Garret, recreation centers, or sidewalks. ? Healthy food choices, such as grocery stores and farmers' markets. What are the signs or symptoms? The main sign is having too much body fat. How is this treated?  Treatment for this condition often includes changing your lifestyle. Treatment may include: ? Changing your diet. This may include making a healthy meal plan. ? Exercise. This may include activity that causes your heart to beat faster (aerobic exercise) and strength training. Work with your doctor to design a program that works for you. ? Medicine to help you lose weight. This may be used if you are not able to lose 1 pound a week after 6 weeks of healthy eating and more exercise. ? Treating conditions that cause the obesity. ? Surgery. Options may include gastric banding and gastric bypass. This may be done if:  Other treatments have not helped to improve your condition.  You have a BMI of 40 or higher.  You have life-threatening health problems related to  obesity. Follow these instructions at home: Eating and drinking   Follow advice from your doctor about what to eat and drink. Your doctor may tell you to: ? Limit fast food, sweets, and processed snack foods. ? Choose low-fat options. For example, choose low-fat milk instead of whole milk. ? Eat 5 or more servings of fruits or vegetables each day. ? Eat at home more often. This gives you more control over what you eat. ? Choose healthy foods when you eat out. ? Learn to read food labels. This will help you learn how much food is in 1 serving. ? Keep low-fat snacks available. ? Avoid drinks that have a lot of sugar in them. These include soda, fruit juice, iced tea with sugar, and flavored milk.  Drink enough water to keep your pee (urine) pale yellow.  Do not go on fad diets. Physical activity  Exercise often, as told by your doctor. Most adults should get up to 150  minutes of moderate-intensity exercise every week.Ask your doctor: ? What types of exercise are safe for you. ? How often you should exercise.  Warm up and stretch before being active.  Do slow stretching after being active (cool down).  Rest between times of being active. Lifestyle  Work with your doctor and a food expert (dietitian) to set a weight-loss goal that is best for you.  Limit your screen time.  Find ways to reward yourself that do not involve food.  Do not drink alcohol if: ? Your doctor tells you not to drink. ? You are pregnant, may be pregnant, or are planning to become pregnant.  If you drink alcohol: ? Limit how much you use to:  0-1 drink a day for women.  0-2 drinks a day for men. ? Be aware of how much alcohol is in your drink. In the [place] one drink equals one 12 oz bottle of beer (355 mL), one 5 oz glass of wine (148 mL), or one 1 oz glass of hard liquor (44 mL). General instructions  Keep a weight-loss journal. This can help you keep track of: ? The food that you eat. ? How much  exercise you get.  Take over-the-counter and prescription medicines only as told by your doctor.  Take vitamins and supplements only as told by your doctor.  Think about joining a support group.  Keep all follow-up visits as told by your doctor. This is important. Contact a doctor if:  You cannot meet your weight loss goal after you have changed your diet and lifestyle for 6 weeks. Get help right away if you:  Are having trouble breathing.  Are having thoughts of harming yourself. Summary  Obesity is having too much body fat.  Being obese means that your weight is more than what is healthy for you.  Work with your doctor to set a weight-loss goal.  Get regular exercise as told by your doctor. This information is not intended to replace advice given to you by your health care provider. Make sure you discuss any questions you have with your health care provider. Document Revised: 10/08/2017 Document Reviewed: 10/08/2017 Elsevier Patient Education  2020 [employer].

## 2019-06-10 NOTE — Progress Notes (Signed)
I have read the note, and I agree with the clinical assessment and plan.  Kathrynn Ducking

## 2019-06-15 ENCOUNTER — Emergency Department (HOSPITAL_COMMUNITY)
Admission: EM | Admit: 2019-06-15 | Discharge: 2019-06-15 | Disposition: A | Discharge status: Home / Self Care | Payer: Medicare Other | Attending: Emergency Medicine | Admitting: Emergency Medicine

## 2019-06-15 ENCOUNTER — Encounter (HOSPITAL_COMMUNITY): Payer: Self-pay | Admitting: Emergency Medicine

## 2019-06-15 ENCOUNTER — Other Ambulatory Visit: Payer: Self-pay

## 2019-06-15 DIAGNOSIS — Z5321 Procedure and treatment not carried out due to patient leaving prior to being seen by health care provider: Secondary | ICD-10-CM | POA: Insufficient documentation

## 2019-06-15 DIAGNOSIS — M7918 Myalgia, other site: Secondary | ICD-10-CM | POA: Diagnosis present

## 2019-06-15 LAB — BASIC METABOLIC PANEL
Anion gap: 11 (ref 5–15)
BUN: 14 mg/dL (ref 8–23)
CO2: 25 mmol/L (ref 22–32)
Calcium: 9.3 mg/dL (ref 8.9–10.3)
Chloride: 102 mmol/L (ref 98–111)
Creatinine, Ser: 1.47 mg/dL — ABNORMAL HIGH (ref 0.44–1.00)
GFR calc Af Amer: 44 mL/min — ABNORMAL LOW (ref >60)
GFR calc non Af Amer: 38 mL/min — ABNORMAL LOW (ref >60)
Glucose, Bld: 141 mg/dL — ABNORMAL HIGH (ref 70–99)
Potassium: 4.5 mmol/L (ref 3.5–5.1)
Sodium: 138 mmol/L (ref 135–145)

## 2019-06-15 LAB — CBC
HCT: 33.2 % — ABNORMAL LOW (ref 36.0–46.0)
Hemoglobin: 10.4 g/dL — ABNORMAL LOW (ref 12.0–15.0)
MCH: 27.9 pg (ref 26.0–34.0)
MCHC: 31.3 g/dL (ref 30.0–36.0)
MCV: 89 fL (ref 80.0–100.0)
Platelets: 225 10*3/uL (ref 150–400)
RBC: 3.73 MIL/uL — ABNORMAL LOW (ref 3.87–5.11)
RDW: 15 % (ref 11.5–15.5)
WBC: 12.1 10*3/uL — ABNORMAL HIGH (ref 4.0–10.5)
nRBC: 0 % (ref 0.0–0.2)

## 2019-06-15 LAB — CBG MONITORING, ED: Glucose-Capillary: 129 mg/dL — ABNORMAL HIGH (ref 70–99)

## 2019-06-15 NOTE — ED Notes (Signed)
No answer x1 for vitals recheck

## 2019-06-15 NOTE — ED Notes (Signed)
No answer x3 for vitals

## 2019-06-15 NOTE — ED Triage Notes (Signed)
Pt arrives to ED with [facility] with complaints of her legs giving out at the nail salon today. Patient states that her chronic back pain and left arm pain of two weeks ago got so bad she fell. Bystanders were able to assist patient to ground safely. Patient states she takes oxycodone for her chronic pain which is not working.

## 2019-06-22 ENCOUNTER — Ambulatory Visit: Payer: Medicare Other | Admitting: Endocrinology

## 2019-06-27 ENCOUNTER — Other Ambulatory Visit: Payer: Self-pay

## 2019-06-27 ENCOUNTER — Other Ambulatory Visit: Payer: Self-pay | Admitting: Endocrinology

## 2019-06-27 DIAGNOSIS — Z794 Long term (current) use of insulin: Secondary | ICD-10-CM

## 2019-06-28 ENCOUNTER — Ambulatory Visit: Payer: Medicare Other | Admitting: Orthopedic Surgery

## 2019-06-29 ENCOUNTER — Telehealth: Payer: Self-pay

## 2019-06-29 ENCOUNTER — Encounter: Payer: Self-pay | Admitting: Endocrinology

## 2019-06-29 ENCOUNTER — Other Ambulatory Visit: Payer: Self-pay

## 2019-06-29 ENCOUNTER — Ambulatory Visit (INDEPENDENT_AMBULATORY_CARE_PROVIDER_SITE_OTHER): Payer: Medicare Other | Admitting: Endocrinology

## 2019-06-29 VITALS — BP 120/78 | HR 119

## 2019-06-29 DIAGNOSIS — E118 Type 2 diabetes mellitus with unspecified complications: Secondary | ICD-10-CM

## 2019-06-29 DIAGNOSIS — Z794 Long term (current) use of insulin: Secondary | ICD-10-CM

## 2019-06-29 LAB — T4, FREE: Free T4: 0.76 ng/dL (ref 0.60–1.60)

## 2019-06-29 LAB — POCT GLYCOSYLATED HEMOGLOBIN (HGB A1C): Hemoglobin A1C: 8 % — AB (ref 4.0–5.6)

## 2019-06-29 LAB — TSH: TSH: 4.42 u[IU]/mL (ref 0.35–4.50)

## 2019-06-29 MED ORDER — TRULICITY 0.75 MG/0.5ML ~~LOC~~ SOAJ: 0.7500 mg | SUBCUTANEOUS | 11 refills | Status: DC

## 2019-06-29 NOTE — Telephone Encounter (Signed)
PRIOR AUTHORIZATION  PA initiation date: 06/29/19  Medication: Trulicity 4.26ST/4.1DQ Insurance Company: [employer] [facility] Submission completed electronically through [employer] My Meds: Yes  Will await insurance response re: approval/denial.  [name] (Key: [id number])  Your information has been sent to [facility].  [name] (Key: [id number])  [employer] is reviewing your PA request. Typically an electronic response will be received within 72 hours. To check for an update later, open this request from your dashboard.  You may close this dialog and return to your dashboard to perform other tasks.  [name] Key: [id number] - PA Case ID: [id number] - Rx #: [id number] help? Call us at [phone] Status Sent to [place] Trulicity 4.12IN/8.6VE pen-injectors Form [employer] Medicare Part D Electronic Prior Authorization Form (2017 NCPDP) Original Claim Info [id number]

## 2019-06-29 NOTE — Progress Notes (Signed)
Subjective:    Patient ID: [name], female    DOB: 1955-04-15, 64 y.o.   MRN: [id number]  HPI Pt returns for f/u of postsurgical hypothyroidism (she had thyroidectomy in approx 1988, for a goiter; she says no cancer was found; she has been on prescribed thyroid hormone therapy since then; she has not recently had thyroid imaging; she does not know why TSH fluctuates, but med noncompliance has been noted; she takes synthroid as rx'ed; MRI (2018) showed normal orbits, and no mention was made of the pituitary).  She takes synthroid as rx'ed.  Since synthroid was increased, she still has fatigue.   Pt also returns for f/u of diabetes mellitus:  DM type: Insulin-requiring type 2.   Dx'ed: 2015.  Complications: stage 3 CRI, PN, and TIA.  Therapy: insulin since 2015, and Janumet.   GDM: never.  DKA: never Severe hypoglycemia: never.   Pancreatitis: never Pancreatic imaging: Fatty atrophy was noted on 2018 CT.   Other: she takes multiple daily injections; she did not tolerate Invokana (sob). Interval history: no cbg record, but states cbg varies from 97-229.  There is no trend throughout the day.  Pt takes meds as rx'ed.   Past Medical History:  Diagnosis Date  . Anemia   . Anxiety   . Arthritis    "pretty much all my joints"  . Asthma   . Benign paroxysmal positional vertigo 12/30/2012  . Breast cancer ([place]) 02/13/12   ruq  100'clock bx Ductal Carcinoma in Situ,(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  . Breast mass in female    L breast 2008, US showed likely fat necrosis  . Chronic lower back pain   . CKD (chronic kidney disease), stage III    "lower stage" (01/06/2014)  . Daily headache    last time was 2016-09-02 (brother passed away)  . Depression   . Dyspnea   . Gait abnormality 05/29/2016  . GERD (gastroesophageal reflux disease)   . History of blood transfusion 2011   "after one of my OR's"  . History of hiatal hernia   . History of stomach ulcers   . HSV (herpes simplex virus)  infection   . Hx of radiation therapy 05/05/12- 07/15/12   right breast, 45 gray x 25 fx, lumpectomy cavity boosted to 16.2 gray  . Hyperlipemia   . Hypertension    sees Dr. Criss Rosales , [facility]  . Hypothyroidism   . Kidney stones   . Knee pain, bilateral   . Memory difficulty 12/22/2017  . Migraines    "~ 3 times/month" (01/06/2014)  . Obesity   . OSA on CPAP    pt does not know settings  . Pancreatitis    hx of  . Personal history of radiation therapy 02/2012  . Pneumonia 1950's  . Polyneuropathy in diabetes(357.2)   . Schizophrenia ([place])   . 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 Tachycardia    with sx in 2008  . Thyroid disease   . Type II diabetes mellitus ([place])    dx 2014    Past Surgical History:  Procedure Laterality Date  . ABDOMINAL HYSTERECTOMY  1979?   partial  . benign cyst  Left    removed from left breast  . BREAST BIOPSY Right 02/13/2012  . BREAST LUMPECTOMY  03/03/2012   Procedure: LUMPECTOMY;  Surgeon: Haywood Lasso, MD;  Location: [facility];  Service: General;  Laterality: Right;  . BREAST SURGERY Right 01/2012   "cancer"  . CHOLECYSTECTOMY  1980's?  [name]  COLONOSCOPY WITH PROPOFOL N/A 09/28/2012   Procedure: COLONOSCOPY WITH PROPOFOL;  Surgeon: Lear Ng, MD;  Location: [facility];  Service: Endoscopy;  Laterality: N/A;  . FOOT FRACTURE SURGERY Right 1990's  . JOINT REPLACEMENT      x 3  . LUMBAR LAMINECTOMY/DECOMPRESSION MICRODISCECTOMY N/A 12/05/2016   Procedure: LEFT L5-S1 MICRODISCECTOMY;  Surgeon: Marybelle Killings, MD;  Location: [facility];  Service: Orthopedics;  Laterality: N/A;  . REVISION TOTAL KNEE ARTHROPLASTY  07/2009  . SHOULDER OPEN ROTATOR CUFF REPAIR Left 1990's  . THYROIDECTOMY  1970's  . TOTAL KNEE ARTHROPLASTY Bilateral 2009-06/2009   left; right    Social History   Socioeconomic History  . Marital status: Divorced    Spouse name: Not on file  . Number of children: 2  . Years of education: 24  . Highest education  level: Not on file  Occupational History  . Occupation: Disabled  Tobacco Use  . Smoking status: Former Smoker    Packs/day: 1.00    Years: 7.00    Pack years: 7.00    Types: Cigarettes    Quit date: 03/24/1992    Years since quitting: 27.2  . Smokeless tobacco: Never Used  Substance and Sexual Activity  . 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 Drug use: No  . Sexual activity: Never    Birth control/protection: Surgical  Other Topics Concern  . Not on file  Social History Narrative   Single. Disabled Biochemist, clinical.          Patient is right handed.    Denies caffeine use    Social Determinants of Radio broadcast assistant Strain:   . Difficulty of Paying Living Expenses:   Food Insecurity:   . Worried About Charity fundraiser in the Last Year:   . Arboriculturist in the Last Year:   Transportation Needs:   . Film/video editor (Medical):   [name] Lack of Transportation (Non-Medical):   Physical Activity:   . Days of Exercise per Week:   . Minutes of Exercise per Session:   Stress:   . Feeling of Stress :   Social Connections:   . Frequency of Communication with Friends and Family:   . Frequency of Social Gatherings with Friends and Family:   . Attends Religious Services:   . Active Member of Clubs or Organizations:   . Attends Archivist Meetings:   [name] Marital Status:   Intimate Partner Violence:   . Fear of Current or Ex-Partner:   . Emotionally Abused:   [name] Physically Abused:   . Sexually Abused:     Current Outpatient Medications on File Prior to Visit  Medication Sig Dispense Refill  . albuterol (PROAIR HFA) 108 (90 Base) MCG/ACT inhaler Inhale 2 puffs into the lungs every 4 (four) hours as needed for wheezing or shortness of breath.     [name] aspirin EC 81 MG tablet Take 1 tablet (81 mg total) by mouth daily. 30 tablet 0  . B Complex-C (B-COMPLEX WITH VITAMIN C) tablet Take 1 tablet by mouth daily.    .  budesonide-formoterol (SYMBICORT) 80-4.5 MCG/ACT inhaler Inhale 2 puffs into the lungs 2 (two) times daily. (Patient taking differently: Inhale 1 puff into the lungs 3 (three) times daily. PRN) 1 Inhaler 11  . calcitRIOL (ROCALTROL) 0.25 MCG capsule Take 1 capsule (0.25 mcg total) by mouth 3 (three) times a week. 12 capsule  2  . Cholecalciferol (VITAMIN D-3) 1000 units CAPS Take 1,000 Units by mouth daily.    . Cyanocobalamin (VITAMIN B-12 PO) Take 1 tablet by mouth daily.    . diphenhydrAMINE (BENADRYL) 25 MG tablet Take 50 mg by mouth every 6 (six) hours as needed for itching or allergies.    [name] doxepin (SINEQUAN) 25 MG capsule Take 50 mg by mouth at bedtime.    [name] esomeprazole (NEXIUM) 40 MG capsule Take 1 capsule (40 mg total) by mouth daily before breakfast. (Patient taking differently: Take 40 mg by mouth daily at 12 noon. ) 30 capsule 1  . furosemide (LASIX) 20 MG tablet Take 1 tablet (20 mg total) by mouth daily. (Patient taking differently: Take 40 mg by mouth daily. ) 90 tablet 1  . HUMALOG KWIKPEN 100 UNIT/ML KwikPen INJECT 10 UNITS THREE TIMES DAILY 15 mL 4  . Iron-Vitamins (GERITOL PO) Take 1 tablet by mouth daily.    . Lancets (ONETOUCH DELICA PLUS [id number]) MISC 1 each by Other route 3 (three) times daily. Use to check blood sugar 3 times a day 300 each 11  . LEVEMIR FLEXTOUCH 100 UNIT/ML Pen Inject 10 Units into the skin at bedtime. (Patient taking differently: Inject 10 Units into the skin at bedtime. ) 15 mL 4  . levothyroxine (SYNTHROID) 175 MCG tablet Take 1 tablet (175 mcg total) by mouth daily before breakfast. 90 tablet 3  . linaclotide (LINZESS) 290 MCG CAPS capsule Take 290 mcg by mouth daily before breakfast.    . LORazepam (ATIVAN) 0.5 MG tablet Take 0.5 mg by mouth 3 (three) times daily.    [name] losartan-hydrochlorothiazide (HYZAAR) 100-12.5 MG tablet Take 1 tablet by mouth daily.    . methocarbamol (ROBAXIN) 500 MG tablet Take 1 tablet (500 mg total) by mouth every 8 (eight)  hours as needed for muscle spasms. 30 tablet 0  . Multiple Vitamin (MULTIVITAMIN WITH MINERALS) TABS tablet Take 1 tablet by mouth daily.    Glory Rosebush VERIO test strip TEST BLOOD SUGAR THREE TIMES DAILY 100 strip 11  . oxyCODONE (ROXICODONE) 15 MG immediate release tablet Take 15 mg by mouth every 6 (six) hours as needed for pain.     . pregabalin (LYRICA) 300 MG capsule Take 300 mg by mouth 2 (two) times daily.    . rosuvastatin (CRESTOR) 20 MG tablet Take 20 mg by mouth daily.    . SURE COMFORT PEN NEEDLES 32G X 6 MM MISC USE TO INJECT SUBCUTANEOUSLY (four) times daily. E11.9] 200 each 1  . tiZANidine (ZANAFLEX) 4 MG tablet Take 4 mg by mouth 2 (two) times daily.     . valACYclovir (VALTREX) 1000 MG tablet Take 1,000 mg by mouth 2 (two) times daily.     [name] venlafaxine XR (EFFEXOR-XR) 150 MG 24 hr capsule Take 150 mg by mouth 2 (two) times daily.    . ziprasidone (GEODON) 60 MG capsule Take 60 mg by mouth daily.     No current facility-administered medications on file prior to visit.    Allergies  Allergen Reactions  . Bee Venom Anaphylaxis  . Invokana [Canagliflozin] Anaphylaxis    Dyspnea and urinary retention  . Shellfish Allergy Anaphylaxis  . Buprenorphine Hcl Other (See Comments)    Overly sedated with morphine drip.  . Other Other (See Comments)    Cats - bad asthma, stopped up    Family History  Problem Relation Age of Onset  . Heart disease Brother  Multiple MIs, starting in his 69s  . Diabetes Brother   . Thyroid disease Brother   . Hypertension Mother   . Diabetes Mother   . Breast cancer Mother 14  . Bone cancer Mother   . Hypertension Sister   . Diabetes Sister   . Breast cancer Sister 93  . Thyroid disease Sister   . Breast cancer Maternal Grandmother   . Heart disease Maternal Grandmother   . Uterine cancer Other 19  . Breast cancer Paternal Aunt 4  . Breast cancer Paternal Grandmother        dx in her 56s  . Prostate cancer Paternal Grandfather     . Asthma Son     BP 120/78   Pulse (!) 119   SpO2 96%    Review of Systems She denies hypoglycemia.    Objective:   Physical Exam VITAL SIGNS:  See vs page GENERAL: no distress.  In wheelchair Pulses: dorsalis pedis intact bilat.   MSK: no deformity of the feet CV: 1+ bilat leg edema Skin:  no ulcer on the feet.  normal color and temp on the feet. Neuro: sensation is intact to touch on the feet, but decreased from normal.      Lab Results  Component Value Date   HGBA1C 8.0 (A) 06/29/2019   Lab Results  Component Value Date   CREATININE 1.47 (H) 06/15/2019   BUN 14 06/15/2019   NA 138 06/15/2019   K 4.5 06/15/2019   CL 102 06/15/2019   CO2 25 06/15/2019   Lab Results  Component Value Date   TSH 4.42 06/29/2019      Assessment & Plan:  Hypothyroidism: well-replaced.  Please continue the same medication. Insulin-requiring type 2 DM: she needs increased rx CRI: d/c metformin  Patient Instructions  Please continue the same insulins Please change the Janumet to "Trulicity."   Blood tests are requested for you today.  We'll let you know about the results.  check your blood sugar twice a day.  vary the time of day when you check, between before the 3 meals, and at bedtime.  also check if you have symptoms of your blood sugar being too high or too low.  please keep a record of the readings and bring it to your next appointment here (or you can bring the meter itself).  You can write it on any piece of paper.  please call us sooner if your blood sugar goes below 70, or if you have a lot of readings over 200. Please come back for a follow-up appointment in 2 months.

## 2019-06-29 NOTE — Patient Instructions (Addendum)
Please continue the same insulins Please change the Janumet to "Trulicity."   Blood tests are requested for you today.  We'll let you know about the results.  check your blood sugar twice a day.  vary the time of day when you check, between before the 3 meals, and at bedtime.  also check if you have symptoms of your blood sugar being too high or too low.  please keep a record of the readings and bring it to your next appointment here (or you can bring the meter itself).  You can write it on any piece of paper.  please call us sooner if your blood sugar goes below 70, or if you have a lot of readings over 200. Please come back for a follow-up appointment in 2 months.

## 2019-06-30 ENCOUNTER — Telehealth: Payer: Self-pay

## 2019-06-30 NOTE — Telephone Encounter (Signed)
-----  Message from Renato Shin, MD sent at 06/29/2019  6:03 PM EDT ----- please contact patient: Thyroid is normal.  Please continue the same medication.  I'll see you next time.

## 2019-06-30 NOTE — Telephone Encounter (Signed)
APPROVAL  Medication: [employer]: [employer] PA response: Approved   Documents have been labeled and placed in scan file for HIM and for our future reference.

## 2019-06-30 NOTE — Telephone Encounter (Signed)
LAB RESULTS  Lab results were reviewed by Dr. Loanne Drilling. A letter has been mailed to pt home address. For future reference, letter can be found in [place].

## 2019-07-21 ENCOUNTER — Other Ambulatory Visit: Payer: Self-pay

## 2019-07-21 ENCOUNTER — Telehealth: Payer: Self-pay | Admitting: Endocrinology

## 2019-07-21 DIAGNOSIS — E118 Type 2 diabetes mellitus with unspecified complications: Secondary | ICD-10-CM

## 2019-07-21 DIAGNOSIS — N179 Acute kidney failure, unspecified: Secondary | ICD-10-CM

## 2019-07-21 DIAGNOSIS — N2581 Secondary hyperparathyroidism of renal origin: Secondary | ICD-10-CM

## 2019-07-21 DIAGNOSIS — Z794 Long term (current) use of insulin: Secondary | ICD-10-CM

## 2019-07-21 MED ORDER — FUROSEMIDE 20 MG PO TABS: 40.0000 mg | ORAL_TABLET | Freq: Every day | ORAL | 0 refills | Status: DC

## 2019-07-21 NOTE — Telephone Encounter (Signed)
Medication Refill Request  Did you call your pharmacy and request this refill first? Yes  If patient has not contacted pharmacy first, instruct them to do so for future refills.   Remind them that contacting the pharmacy for their refill is the quickest method to get the refill.   Refill policy also stated that it will take anywhere between 24-72 hours to receive the refill.    Name of medication?  furosemide (LASIX) 20 MG tablet  Is this a 90 day supply? Has not been but requests and prefers a 90 day RX  Name and location of pharmacy?  [place], [place] Phone:  [phone]  Fax:  [phone]     Patient states she is completely out of the above medication and requests RX be sent asap  Is the request for diabetes test strips? No  If yes, what brand? N/A

## 2019-07-25 ENCOUNTER — Other Ambulatory Visit: Payer: Self-pay | Admitting: Endocrinology

## 2019-07-25 DIAGNOSIS — N2581 Secondary hyperparathyroidism of renal origin: Secondary | ICD-10-CM

## 2019-08-15 ENCOUNTER — Other Ambulatory Visit: Payer: Self-pay | Admitting: Physician Assistant

## 2019-08-15 DIAGNOSIS — N63 Unspecified lump in unspecified breast: Secondary | ICD-10-CM

## 2019-08-29 ENCOUNTER — Other Ambulatory Visit: Payer: Self-pay

## 2019-08-29 ENCOUNTER — Ambulatory Visit (INDEPENDENT_AMBULATORY_CARE_PROVIDER_SITE_OTHER): Payer: Medicare Other | Admitting: Endocrinology

## 2019-08-29 VITALS — BP 136/74 | HR 74 | Temp 97.5°F | Resp 18

## 2019-08-29 DIAGNOSIS — Z794 Long term (current) use of insulin: Secondary | ICD-10-CM

## 2019-08-29 DIAGNOSIS — E118 Type 2 diabetes mellitus with unspecified complications: Secondary | ICD-10-CM | POA: Diagnosis not present

## 2019-08-29 LAB — POCT GLYCOSYLATED HEMOGLOBIN (HGB A1C): Hemoglobin A1C: 7.8 % — AB (ref 4.0–5.6)

## 2019-08-29 MED ORDER — TRULICITY 1.5 MG/0.5ML ~~LOC~~ SOAJ
1.5000 mg | SUBCUTANEOUS | 11 refills | Status: DC
Start: 2019-08-29 — End: 2019-11-02

## 2019-08-29 NOTE — Patient Instructions (Addendum)
Please continue the same insulins I have sent a prescription to your pharmacy, to increase the Trulicity. Blood tests are requested for you today.  We'll let you know about the results.  check your blood sugar twice a day.  vary the time of day when you check, between before the 3 meals, and at bedtime.  also check if you have symptoms of your blood sugar being too high or too low.  please keep a record of the readings and bring it to your next appointment here (or you can bring the meter itself).  You can write it on any piece of paper.  please call us sooner if your blood sugar goes below 70, or if you have a lot of readings over 200. Please come back for a follow-up appointment in 2 months.

## 2019-08-29 NOTE — Progress Notes (Signed)
Subjective:    Patient ID: [name], female    DOB: [birth year], 64 y.o.   MRN: [id number]  HPI Pt returns for f/u of postsurgical hypothyroidism (she had thyroidectomy in approx 1988, for a goiter; she says no cancer was found; she has been on prescribed thyroid hormone therapy since then; she has not recently had thyroid imaging; she does not know why TSH fluctuates, but med noncompliance has been noted; she takes synthroid as rx'ed; MRI (2018) showed normal orbits, and no mention was made of the pituitary).  She takes synthroid as rx'ed.  Since synthroid was increased, she still has fatigue.   Pt also returns for f/u of diabetes mellitus:  DM type: Insulin-requiring type 2.   Dx'ed: 2015.  Complications: stage 3 CRI, PN, and TIA.  Therapy: insulin since 9390, and Trulicity.    GDM: never.  DKA: never Severe hypoglycemia: never.   Pancreatitis: never Pancreatic imaging: Fatty atrophy was noted on 2018 CT.   Other: she takes multiple daily injections; she did not tolerate Invokana (sob). Interval history: no cbg record, but states cbg varies from 97-250.  There is no trend throughout the day.  Pt takes meds as rx'ed. Denies n/v.  Past Medical History:  Diagnosis Date  . Anemia   . Anxiety   . Arthritis    "pretty much all my joints"  . Asthma   . Benign paroxysmal positional vertigo 12/30/2012  . Breast cancer ([place]) 02/13/12   ruq  100'clock bx Ductal Carcinoma in Situ,(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  . Breast mass in female    L breast 2008, US showed likely fat necrosis  . Chronic lower back pain   . CKD (chronic kidney disease), stage III    "lower stage" (01/06/2014)  . Daily headache    last time was 2016-09-13 (brother passed away)  . Depression   . Dyspnea   . Gait abnormality 05/29/2016  . GERD (gastroesophageal reflux disease)   . History of blood transfusion 2011   "after one of my OR's"  . History of hiatal hernia   . History of stomach ulcers   . HSV (herpes simplex  virus) infection   . Hx of radiation therapy 05/05/12- 07/15/12   right breast, 45 gray x 25 fx, lumpectomy cavity boosted to 16.2 gray  . Hyperlipemia   . Hypertension    sees Dr. Criss Rosales , [facility]  . Hypothyroidism   . Kidney stones   . Knee pain, bilateral   . Memory difficulty 12/22/2017  . Migraines    "~ 3 times/month" (01/06/2014)  . Obesity   . OSA on CPAP    pt does not know settings  . Pancreatitis    hx of  . Personal history of radiation therapy 02/2012  . Pneumonia 1950's  . Polyneuropathy in diabetes(357.2)   . Schizophrenia ([place])   . 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 Tachycardia    with sx in 2008  . Thyroid disease   . Type II diabetes mellitus ([place])    dx 2014    Past Surgical History:  Procedure Laterality Date  . ABDOMINAL HYSTERECTOMY  1979?   partial  . benign cyst  Left    removed from left breast  . BREAST BIOPSY Right 02/13/2012  . BREAST LUMPECTOMY  03/03/2012   Procedure: LUMPECTOMY;  Surgeon: Haywood Lasso, MD;  Location: [facility];  Service: General;  Laterality: Right;  . BREAST SURGERY Right 01/2012   "cancer"  . CHOLECYSTECTOMY  1980's?  . COLONOSCOPY WITH PROPOFOL N/A 09/28/2012   Procedure: COLONOSCOPY WITH PROPOFOL;  Surgeon: Lear Ng, MD;  Location: [facility];  Service: Endoscopy;  Laterality: N/A;  . FOOT FRACTURE SURGERY Right 1990's  . JOINT REPLACEMENT      x 3  . LUMBAR LAMINECTOMY/DECOMPRESSION MICRODISCECTOMY N/A 12/05/2016   Procedure: LEFT L5-S1 MICRODISCECTOMY;  Surgeon: Marybelle Killings, MD;  Location: [facility];  Service: Orthopedics;  Laterality: N/A;  . REVISION TOTAL KNEE ARTHROPLASTY  07/2009  . SHOULDER OPEN ROTATOR CUFF REPAIR Left 1990's  . THYROIDECTOMY  1970's  . TOTAL KNEE ARTHROPLASTY Bilateral 2009-06/2009   left; right    Social History   Socioeconomic History  . Marital status: Divorced    Spouse name: Not on file  . Number of children: 2  . Years of education: 72  . Highest  education level: Not on file  Occupational History  . Occupation: Disabled  Tobacco Use  . Smoking status: Former Smoker    Packs/day: 1.00    Years: 7.00    Pack years: 7.00    Types: Cigarettes    Quit date: 03/24/1992    Years since quitting: 27.4  . Smokeless tobacco: Never Used  Vaping Use  . Vaping Use: Never used  Substance and Sexual Activity  . 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 Drug use: No  . Sexual activity: Never    Birth control/protection: Surgical  Other Topics Concern  . Not on file  Social History Narrative   Single. Disabled Biochemist, clinical.          Patient is right handed.    Denies caffeine use    Social Determinants of Radio broadcast assistant Strain:   . Difficulty of Paying Living Expenses:   Food Insecurity:   . Worried About Charity fundraiser in the Last Year:   . Arboriculturist in the Last Year:   Transportation Needs:   . Film/video editor (Medical):   [name] Lack of Transportation (Non-Medical):   Physical Activity:   . Days of Exercise per Week:   . Minutes of Exercise per Session:   Stress:   . Feeling of Stress :   Social Connections:   . Frequency of Communication with Friends and Family:   . Frequency of Social Gatherings with Friends and Family:   . Attends Religious Services:   . Active Member of Clubs or Organizations:   . Attends Archivist Meetings:   [name] Marital Status:   Intimate Partner Violence:   . Fear of Current or Ex-Partner:   . Emotionally Abused:   [name] Physically Abused:   . Sexually Abused:     Current Outpatient Medications on File Prior to Visit  Medication Sig Dispense Refill  . albuterol (PROAIR HFA) 108 (90 Base) MCG/ACT inhaler Inhale 2 puffs into the lungs every 4 (four) hours as needed for wheezing or shortness of breath.     [name] aspirin EC 81 MG tablet Take 1 tablet (81 mg total) by mouth daily. 30 tablet 0  . B Complex-C (B-COMPLEX WITH  VITAMIN C) tablet Take 1 tablet by mouth daily.    . budesonide-formoterol (SYMBICORT) 80-4.5 MCG/ACT inhaler Inhale 2 puffs into the lungs 2 (two) times daily. (Patient taking differently: Inhale 1 puff into the lungs 3 (three) times daily. PRN) 1 Inhaler 11  . calcitRIOL (ROCALTROL) 0.25 MCG capsule Take 1 capsule (  0.25 mcg total) by mouth 3 (three) times a week. 12 capsule 2  . Cholecalciferol (VITAMIN D-3) 1000 units CAPS Take 1,000 Units by mouth daily.    . Cyanocobalamin (VITAMIN B-12 PO) Take 1 tablet by mouth daily.    . diphenhydrAMINE (BENADRYL) 25 MG tablet Take 50 mg by mouth every 6 (six) hours as needed for itching or allergies.    [name] doxepin (SINEQUAN) 25 MG capsule Take 50 mg by mouth at bedtime.    [name] esomeprazole (NEXIUM) 40 MG capsule Take 1 capsule (40 mg total) by mouth daily before breakfast. (Patient taking differently: Take 40 mg by mouth daily at 12 noon. ) 30 capsule 1  . furosemide (LASIX) 20 MG tablet Take 2 tablets (40 mg total) by mouth daily. 90 tablet 0  . HUMALOG KWIKPEN 100 UNIT/ML KwikPen INJECT 10 UNITS THREE TIMES DAILY 15 mL 4  . Iron-Vitamins (GERITOL PO) Take 1 tablet by mouth daily.    . Lancets (ONETOUCH DELICA PLUS [id number]) MISC 1 each by Other route 3 (three) times daily. Use to check blood sugar 3 times a day 300 each 11  . LEVEMIR FLEXTOUCH 100 UNIT/ML Pen Inject 10 Units into the skin at bedtime. (Patient taking differently: Inject 10 Units into the skin at bedtime. ) 15 mL 4  . levothyroxine (SYNTHROID) 175 MCG tablet Take 1 tablet (175 mcg total) by mouth daily before breakfast. 90 tablet 3  . linaclotide (LINZESS) 290 MCG CAPS capsule Take 290 mcg by mouth daily before breakfast.    . LORazepam (ATIVAN) 0.5 MG tablet Take 0.5 mg by mouth 3 (three) times daily.    [name] losartan-hydrochlorothiazide (HYZAAR) 100-12.5 MG tablet Take 1 tablet by mouth daily.    . methocarbamol (ROBAXIN) 500 MG tablet Take 1 tablet (500 mg total) by mouth every 8 (eight)  hours as needed for muscle spasms. 30 tablet 0  . Multiple Vitamin (MULTIVITAMIN WITH MINERALS) TABS tablet Take 1 tablet by mouth daily.    Glory Rosebush VERIO test strip TEST BLOOD SUGAR THREE TIMES DAILY 100 strip 11  . oxyCODONE (ROXICODONE) 15 MG immediate release tablet Take 15 mg by mouth every 6 (six) hours as needed for pain.     . pregabalin (LYRICA) 300 MG capsule Take 300 mg by mouth 2 (two) times daily.    . rosuvastatin (CRESTOR) 20 MG tablet Take 20 mg by mouth daily.    . SURE COMFORT PEN NEEDLES 32G X 6 MM MISC USE TO INJECT SUBCUTANEOUSLY (four) times daily. E11.9] 200 each 1  . tiZANidine (ZANAFLEX) 4 MG tablet Take 4 mg by mouth 2 (two) times daily.     . valACYclovir (VALTREX) 1000 MG tablet Take 1,000 mg by mouth 2 (two) times daily.     [name] venlafaxine XR (EFFEXOR-XR) 150 MG 24 hr capsule Take 150 mg by mouth 2 (two) times daily.    . ziprasidone (GEODON) 60 MG capsule Take 60 mg by mouth daily.     No current facility-administered medications on file prior to visit.    Allergies  Allergen Reactions  . Bee Venom Anaphylaxis  . Invokana [Canagliflozin] Anaphylaxis    Dyspnea and urinary retention  . Shellfish Allergy Anaphylaxis  . Buprenorphine Hcl Other (See Comments)    Overly sedated with morphine drip.  . Other Other (See Comments)    Cats - bad asthma, stopped up    Family History  Problem Relation Age of Onset  . Heart disease Brother  Multiple MIs, starting in his 29s  . Diabetes Brother   . Thyroid disease Brother   . Hypertension Mother   . Diabetes Mother   . Breast cancer Mother 56  . Bone cancer Mother   . Hypertension Sister   . Diabetes Sister   . Breast cancer Sister 30  . Thyroid disease Sister   . Breast cancer Maternal Grandmother   . Heart disease Maternal Grandmother   . Uterine cancer Other 19  . Breast cancer Paternal Aunt 69  . Breast cancer Paternal Grandmother        dx in her 36s  . Prostate cancer Paternal Grandfather     . Asthma Son     BP 136/74   Pulse 74   Temp (!) 97.5 F (36.4 C)   Resp 18   SpO2 94%    Review of Systems She denies hypoglycemia    Objective:   Physical Exam VITAL SIGNS:  See vs page GENERAL: no distress.  In wheelchair Pulses: dorsalis pedis intact bilat.   MSK: no deformity of the feet CV: 2+ bilatleg edema Skin:  no ulcer on the feet.  normal color and temp on the feet. Neuro: sensation is intact to touch on the feet, but decreased from normal.   Lab Results  Component Value Date   HGBA1C 7.8 (A) 08/29/2019   Lab Results  Component Value Date   CREATININE 1.47 (H) 06/15/2019   BUN 14 06/15/2019   NA 138 06/15/2019   K 4.5 06/15/2019   CL 102 06/15/2019   CO2 25 06/15/2019       Assessment & Plan:  Insulin-requiring type 2 DM, with stage 3 CRI: she needs increased rx Obesity: in this setting, Trulicity is favored over insulin  Patient Instructions  Please continue the same insulins I have sent a prescription to your pharmacy, to increase the Trulicity. Blood tests are requested for you today.  We'll let you know about the results.  check your blood sugar twice a day.  vary the time of day when you check, between before the 3 meals, and at bedtime.  also check if you have symptoms of your blood sugar being too high or too low.  please keep a record of the readings and bring it to your next appointment here (or you can bring the meter itself).  You can write it on any piece of paper.  please call us sooner if your blood sugar goes below 70, or if you have a lot of readings over 200. Please come back for a follow-up appointment in 2 months.

## 2019-09-05 ENCOUNTER — Ambulatory Visit
Admission: RE | Admit: 2019-09-05 | Discharge: 2019-09-05 | Disposition: A | Discharge status: Home / Self Care | Payer: Medicare Other | Source: Ambulatory Visit | Attending: Physician Assistant | Admitting: Physician Assistant

## 2019-09-05 ENCOUNTER — Other Ambulatory Visit: Payer: Medicare Other

## 2019-09-05 ENCOUNTER — Other Ambulatory Visit: Payer: Self-pay

## 2019-09-05 DIAGNOSIS — N63 Unspecified lump in unspecified breast: Secondary | ICD-10-CM

## 2019-09-09 ENCOUNTER — Other Ambulatory Visit: Payer: Self-pay | Admitting: Endocrinology

## 2019-09-09 DIAGNOSIS — N2581 Secondary hyperparathyroidism of renal origin: Secondary | ICD-10-CM

## 2019-09-09 DIAGNOSIS — E118 Type 2 diabetes mellitus with unspecified complications: Secondary | ICD-10-CM

## 2019-09-09 DIAGNOSIS — N179 Acute kidney failure, unspecified: Secondary | ICD-10-CM

## 2019-10-17 ENCOUNTER — Other Ambulatory Visit: Payer: Self-pay | Admitting: Endocrinology

## 2019-10-17 DIAGNOSIS — N2581 Secondary hyperparathyroidism of renal origin: Secondary | ICD-10-CM

## 2019-10-17 NOTE — Telephone Encounter (Signed)
Please advise 

## 2019-11-02 ENCOUNTER — Other Ambulatory Visit: Payer: Self-pay

## 2019-11-02 ENCOUNTER — Ambulatory Visit (INDEPENDENT_AMBULATORY_CARE_PROVIDER_SITE_OTHER): Payer: Medicare Other | Admitting: Endocrinology

## 2019-11-02 VITALS — BP 122/80 | HR 89 | Ht 64.0 in | Wt 375.0 lb

## 2019-11-02 DIAGNOSIS — Z794 Long term (current) use of insulin: Secondary | ICD-10-CM | POA: Diagnosis not present

## 2019-11-02 DIAGNOSIS — E118 Type 2 diabetes mellitus with unspecified complications: Secondary | ICD-10-CM

## 2019-11-02 LAB — POCT GLYCOSYLATED HEMOGLOBIN (HGB A1C): Hemoglobin A1C: 7.6 % — AB (ref 4.0–5.6)

## 2019-11-02 MED ORDER — TRULICITY 3 MG/0.5ML ~~LOC~~ SOAJ: 3.0000 mg | SUBCUTANEOUS | 3 refills | Status: DC

## 2019-11-02 NOTE — Patient Instructions (Addendum)
Please continue the same humalog, and:  Stop taking the Levemir, and:  I have sent a prescription to your pharmacy, to increase the Trulicity again.   check your blood sugar twice a day.  vary the time of day when you check, between before the 3 meals, and at bedtime.  also check if you have symptoms of your blood sugar being too high or too low.  please keep a record of the readings and bring it to your next appointment here (or you can bring the meter itself).  You can write it on any piece of paper.  please call us sooner if your blood sugar goes below 70, or if you have a lot of readings over 200. Please come back for a follow-up appointment in 2 months.

## 2019-11-02 NOTE — Progress Notes (Signed)
Subjective:    Patient ID: [name], female    DOB: [birth year], 64 y.o.   MRN: [id number]  HPI Pt returns for f/u of postsurgical hypothyroidism (she had thyroidectomy in approx 1988, for a goiter; she says no cancer was found; she has been on prescribed thyroid hormone therapy since then; she has not recently had thyroid imaging; she does not know why TSH fluctuates, but med noncompliance has been noted; she takes synthroid as rx'ed; MRI (2018) showed normal orbits, and no mention was made of the pituitary).  She takes synthroid as rx'ed.  Since synthroid was increased, she still has fatigue.   Pt also returns for f/u of diabetes mellitus:  DM type: Insulin-requiring type 2.   Dx'ed: 2015.  Complications: stage 3 CRI, PN, and TIA.  Therapy: insulin since 4709, and Trulicity.    GDM: never.  DKA: never Severe hypoglycemia: never.   Pancreatitis: never Pancreatic imaging: Fatty atrophy was noted on 2018 CT.   Other: she takes multiple daily injections; she did not tolerate Invokana (sob). Interval history: she brings her meter with her cbg's which I have reviewed today.  cbg varies from 96-208.  There is no trend throughout the day.  Pt says she seldom misses insulin doses.  Past Medical History:  Diagnosis Date  . Anemia   . Anxiety   . Arthritis    "pretty much all my joints"  . Asthma   . Benign paroxysmal positional vertigo 12/30/2012  . Breast cancer ([place]) 02/13/12   ruq  100'clock bx Ductal Carcinoma in Situ,(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  . Breast mass in female    L breast 2008, US showed likely fat necrosis  . Chronic lower back pain   . CKD (chronic kidney disease), stage III    "lower stage" (01/06/2014)  . Daily headache    last time was 09/20/2016 (brother passed away)  . Depression   . Dyspnea   . Gait abnormality 05/29/2016  . GERD (gastroesophageal reflux disease)   . History of blood transfusion 2011   "after one of my OR's"  . History of hiatal hernia   .  History of stomach ulcers   . HSV (herpes simplex virus) infection   . Hx of radiation therapy 05/05/12- 07/15/12   right breast, 45 gray x 25 fx, lumpectomy cavity boosted to 16.2 gray  . Hyperlipemia   . Hypertension    sees Dr. Criss Rosales , [facility]  . Hypothyroidism   . Kidney stones   . Knee pain, bilateral   . Memory difficulty 12/22/2017  . Migraines    "~ 3 times/month" (01/06/2014)  . Obesity   . OSA on CPAP    pt does not know settings  . Pancreatitis    hx of  . Personal history of radiation therapy 02/2012  . Pneumonia 1950's  . Polyneuropathy in diabetes(357.2)   . Schizophrenia ([place])   . 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 Tachycardia    with sx in 2008  . Thyroid disease   . Type II diabetes mellitus ([place])    dx 2014    Past Surgical History:  Procedure Laterality Date  . ABDOMINAL HYSTERECTOMY  1979?   partial  . benign cyst  Left    removed from left breast  . BREAST BIOPSY Right 02/13/2012  . BREAST LUMPECTOMY  03/03/2012   Procedure: LUMPECTOMY;  Surgeon: Haywood Lasso, MD;  Location: [facility];  Service: General;  Laterality: Right;  . BREAST SURGERY  Right 01/2012   "cancer"  . CHOLECYSTECTOMY  1980's?  . COLONOSCOPY WITH PROPOFOL N/A 09/28/2012   Procedure: COLONOSCOPY WITH PROPOFOL;  Surgeon: Lear Ng, MD;  [facility];  Service: Endoscopy;  Laterality: N/A;  . FOOT FRACTURE SURGERY Right 1990's  . JOINT REPLACEMENT      x 3  . LUMBAR LAMINECTOMY/DECOMPRESSION MICRODISCECTOMY N/A 12/05/2016   Procedure: LEFT L5-S1 MICRODISCECTOMY;  Surgeon: Marybelle Killings, MD;  Location: [facility];  Service: Orthopedics;  Laterality: N/A;  . REVISION TOTAL KNEE ARTHROPLASTY  07/2009  . SHOULDER OPEN ROTATOR CUFF REPAIR Left 1990's  . THYROIDECTOMY  1970's  . TOTAL KNEE ARTHROPLASTY Bilateral 2009-06/2009   left; right    Social History   Socioeconomic History  . Marital status: Divorced    Spouse name: Not on file  . Number of  children: 2  . Years of education: 39  . Highest education level: Not on file  Occupational History  . Occupation: Disabled  Tobacco Use  . Smoking status: Former Smoker    Packs/day: 1.00    Years: 7.00    Pack years: 7.00    Types: Cigarettes    Quit date: 03/24/1992    Years since quitting: 27.6  . Smokeless tobacco: Never Used  Vaping Use  . Vaping Use: Never used  Substance and Sexual Activity  . 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 Drug use: No  . Sexual activity: Never    Birth control/protection: Surgical  Other Topics Concern  . Not on file  Social History Narrative   Single. Disabled Biochemist, clinical.          Patient is right handed.    Denies caffeine use    Social Determinants of Radio broadcast assistant Strain:   . Difficulty of Paying Living Expenses: Not on file  Food Insecurity:   . Worried About Charity fundraiser in the Last Year: Not on file  . Ran Out of Food in the Last Year: Not on file  Transportation Needs:   . Lack of Transportation (Medical): Not on file  . Lack of Transportation (Non-Medical): Not on file  Physical Activity:   . Days of Exercise per Week: Not on file  . Minutes of Exercise per Session: Not on file  Stress:   . Feeling of Stress : Not on file  Social Connections:   . Frequency of Communication with Friends and Family: Not on file  . Frequency of Social Gatherings with Friends and Family: Not on file  . Attends Religious Services: Not on file  . Active Member of Clubs or Organizations: Not on file  . Attends Archivist Meetings: Not on file  . Marital Status: Not on file  Intimate Partner Violence:   . Fear of Current or Ex-Partner: Not on file  . Emotionally Abused: Not on file  . Physically Abused: Not on file  . Sexually Abused: Not on file    Current Outpatient Medications on File Prior to Visit  Medication Sig Dispense Refill  . albuterol (PROAIR HFA) 108  (90 Base) MCG/ACT inhaler Inhale 2 puffs into the lungs every 4 (four) hours as needed for wheezing or shortness of breath.     [name] aspirin EC 81 MG tablet Take 1 tablet (81 mg total) by mouth daily. 30 tablet 0  . B Complex-C (B-COMPLEX WITH VITAMIN C) tablet Take 1 tablet by mouth daily.    [name]  budesonide-formoterol (SYMBICORT) 80-4.5 MCG/ACT inhaler Inhale 2 puffs into the lungs 2 (two) times daily. (Patient taking differently: Inhale 1 puff into the lungs 3 (three) times daily. PRN) 1 Inhaler 11  . calcitRIOL (ROCALTROL) 0.25 MCG capsule Take 1 capsule (0.25 mcg total) by mouth 3 (three) times a week. 12 capsule 2  . Cholecalciferol (VITAMIN D-3) 1000 units CAPS Take 1,000 Units by mouth daily.    . Cyanocobalamin (VITAMIN B-12 PO) Take 1 tablet by mouth daily.    . diphenhydrAMINE (BENADRYL) 25 MG tablet Take 50 mg by mouth every 6 (six) hours as needed for itching or allergies.    [name] doxepin (SINEQUAN) 25 MG capsule Take 50 mg by mouth at bedtime.    [name] esomeprazole (NEXIUM) 40 MG capsule Take 1 capsule (40 mg total) by mouth daily before breakfast. (Patient taking differently: Take 40 mg by mouth daily at 12 noon. ) 30 capsule 1  . furosemide (LASIX) 20 MG tablet Take 2 tablets (40 mg total) by mouth daily. 90 tablet 0  . HUMALOG KWIKPEN 100 UNIT/ML KwikPen INJECT 10 UNITS THREE TIMES DAILY 15 mL 4  . Iron-Vitamins (GERITOL PO) Take 1 tablet by mouth daily.    . Lancets (ONETOUCH DELICA PLUS [id number]) MISC 1 each by Other route 3 (three) times daily. Use to check blood sugar 3 times a day 300 each 11  . levothyroxine (SYNTHROID) 175 MCG tablet Take 1 tablet (175 mcg total) by mouth daily before breakfast. 90 tablet 3  . linaclotide (LINZESS) 290 MCG CAPS capsule Take 290 mcg by mouth daily before breakfast.    . LORazepam (ATIVAN) 0.5 MG tablet Take 0.5 mg by mouth 3 (three) times daily.    [name] losartan-hydrochlorothiazide (HYZAAR) 100-12.5 MG tablet Take 1 tablet by mouth daily.    . methocarbamol  (ROBAXIN) 500 MG tablet Take 1 tablet (500 mg total) by mouth every 8 (eight) hours as needed for muscle spasms. 30 tablet 0  . Multiple Vitamin (MULTIVITAMIN WITH MINERALS) TABS tablet Take 1 tablet by mouth daily.    Glory Rosebush VERIO test strip TEST BLOOD SUGAR THREE TIMES DAILY 100 strip 11  . oxyCODONE (ROXICODONE) 15 MG immediate release tablet Take 15 mg by mouth every 6 (six) hours as needed for pain.     . pregabalin (LYRICA) 300 MG capsule Take 300 mg by mouth 2 (two) times daily.    . rosuvastatin (CRESTOR) 20 MG tablet Take 20 mg by mouth daily.    . SURE COMFORT PEN NEEDLES 32G X 6 MM MISC USE TO INJECT SUBCUTANEOUSLY (four) times daily. E11.9] 200 each 1  . tiZANidine (ZANAFLEX) 4 MG tablet Take 4 mg by mouth 2 (two) times daily.     . valACYclovir (VALTREX) 1000 MG tablet Take 1,000 mg by mouth 2 (two) times daily.     [name] venlafaxine XR (EFFEXOR-XR) 150 MG 24 hr capsule Take 150 mg by mouth 2 (two) times daily.    . ziprasidone (GEODON) 60 MG capsule Take 60 mg by mouth daily.     No current facility-administered medications on file prior to visit.    Allergies  Allergen Reactions  . Bee Venom Anaphylaxis  . Invokana [Canagliflozin] Anaphylaxis    Dyspnea and urinary retention  . Shellfish Allergy Anaphylaxis  . Buprenorphine Hcl Other (See Comments)    Overly sedated with morphine drip.  . Other Other (See Comments)    Cats - bad asthma, stopped up    Family History  Problem Relation  Age of Onset  . Heart disease Brother        Multiple MIs, starting in his 50s  . Diabetes Brother   . Thyroid disease Brother   . Hypertension Mother   . Diabetes Mother   . Breast cancer Mother 20  . Bone cancer Mother   . Hypertension Sister   . Diabetes Sister   . Breast cancer Sister 36  . Thyroid disease Sister   . Breast cancer Maternal Grandmother   . Heart disease Maternal Grandmother   . Uterine cancer Other 19  . Breast cancer Paternal Aunt 35  . Breast cancer  Paternal Grandmother        dx in her 71s  . Prostate cancer Paternal Grandfather   . Asthma Son     BP 122/80   Pulse 89   Ht _0  (1.626 m)   Wt (!) 375 lb (170.1 kg) Comment: per patient  SpO2 96%   BMI 64.37 kg/m    Review of Systems She denies nausea.      Objective:   Physical Exam VITAL SIGNS:  See vs page GENERAL: no distress.  In wheelchair Pulses: dorsalis pedis intact bilat.   MSK: no deformity of the feet CV: 1+ bilat leg edema Skin:  no ulcer on the feet.  normal color and temp on the feet. Neuro: sensation is intact to touch on the feet  Lab Results  Component Value Date   HGBA1C 7.6 (A) 11/02/2019       Assessment & Plan:  Insulin-requiring type 2 DM, with CRI Morbid obesity: increasing Trulicity might help.   Patient Instructions  Please continue the same humalog, and:  Stop taking the Levemir, and:  I have sent a prescription to your pharmacy, to increase the Trulicity again.   check your blood sugar twice a day.  vary the time of day when you check, between before the 3 meals, and at bedtime.  also check if you have symptoms of your blood sugar being too high or too low.  please keep a record of the readings and bring it to your next appointment here (or you can bring the meter itself).  You can write it on any piece of paper.  please call us sooner if your blood sugar goes below 70, or if you have a lot of readings over 200. Please come back for a follow-up appointment in 2 months.

## 2019-11-08 ENCOUNTER — Other Ambulatory Visit: Payer: Self-pay | Admitting: Endocrinology

## 2020-01-03 ENCOUNTER — Other Ambulatory Visit: Payer: Self-pay | Admitting: Endocrinology

## 2020-01-04 ENCOUNTER — Other Ambulatory Visit: Payer: Self-pay

## 2020-01-04 ENCOUNTER — Encounter: Payer: Self-pay | Admitting: Endocrinology

## 2020-01-04 ENCOUNTER — Ambulatory Visit (INDEPENDENT_AMBULATORY_CARE_PROVIDER_SITE_OTHER): Payer: Medicare Other | Admitting: Endocrinology

## 2020-01-04 VITALS — BP 122/68 | HR 101

## 2020-01-04 DIAGNOSIS — Z794 Long term (current) use of insulin: Secondary | ICD-10-CM | POA: Diagnosis not present

## 2020-01-04 DIAGNOSIS — E890 Postprocedural hypothyroidism: Secondary | ICD-10-CM | POA: Diagnosis not present

## 2020-01-04 DIAGNOSIS — N2581 Secondary hyperparathyroidism of renal origin: Secondary | ICD-10-CM

## 2020-01-04 DIAGNOSIS — E118 Type 2 diabetes mellitus with unspecified complications: Secondary | ICD-10-CM

## 2020-01-04 LAB — POCT GLYCOSYLATED HEMOGLOBIN (HGB A1C): Hemoglobin A1C: 8.1 % — AB (ref 4.0–5.6)

## 2020-01-04 LAB — T4, FREE: Free T4: 0.73 ng/dL (ref 0.60–1.60)

## 2020-01-04 LAB — VITAMIN D 25 HYDROXY (VIT D DEFICIENCY, FRACTURES): VITD: 73.5 ng/mL (ref 30.00–100.00)

## 2020-01-04 LAB — TSH: TSH: 5.86 u[IU]/mL — ABNORMAL HIGH (ref 0.35–4.50)

## 2020-01-04 MED ORDER — TRULICITY 4.5 MG/0.5ML ~~LOC~~ SOAJ: 4.5000 mg | SUBCUTANEOUS | 3 refills | Status: DC

## 2020-01-04 NOTE — Patient Instructions (Addendum)
Please continue the same humalog, and:  I have sent a prescription to your pharmacy, to increase the Trulicity again.   Blood tests are requested for you today.  We'll let you know about the results.  check your blood sugar twice a day.  vary the time of day when you check, between before the 3 meals, and at bedtime.  also check if you have symptoms of your blood sugar being too high or too low.  please keep a record of the readings and bring it to your next appointment here (or you can bring the meter itself).  You can write it on any piece of paper.  please call us sooner if your blood sugar goes below 70, or if you have a lot of readings over 200. Please come back for a follow-up appointment in 2 months.

## 2020-01-04 NOTE — Progress Notes (Signed)
Subjective:    Patient ID: [name], female    DOB: [birth year], 64 y.o.   MRN: [id number]  HPI Pt returns for f/u of postsurgical hypothyroidism (she had thyroidectomy in approx 1988, for a goiter; she says no cancer was found; she has been on prescribed thyroid hormone therapy since then; she has not recently had thyroid imaging; she does not know why TSH fluctuates, but med noncompliance has been noted; she takes synthroid as rx'ed; MRI (2018) showed normal orbits, and no mention was made of the pituitary).  She takes synthroid as rx'ed.  Since synthroid was increased, she still has fatigue.   Pt also returns for f/u of diabetes mellitus:  DM type: Insulin-requiring type 2.   Dx'ed: 2015.  Complications: stage 3 CRI, PN, and TIA.  Therapy: insulin since 9798, and Trulicity.    GDM: never.  DKA: never Severe hypoglycemia: never.   Pancreatitis: never Pancreatic imaging: Fatty atrophy was noted on 2018 CT.   Other: she takes multiple daily injections; she did not tolerate Invokana (sob).   Interval history: no cbg record, but states cbg varies from 129-220.  There is no trend throughout the day.  Pt says she seldom misses med doses.  She takes vit-D, at an uncertain dosage Past Medical History:  Diagnosis Date  . Anemia   . Anxiety   . Arthritis    "pretty much all my joints"  . Asthma   . Benign paroxysmal positional vertigo 12/30/2012  . Breast cancer ([place]) 02/13/12   ruq  100'clock bx Ductal Carcinoma in Situ,(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  . Breast mass in female    L breast 2008, US showed likely fat necrosis  . Chronic lower back pain   . CKD (chronic kidney disease), stage III ([place])    "lower stage" (01/06/2014)  . Daily headache    last time was September 10, 2016 (brother passed away)  . Depression   . Dyspnea   . Gait abnormality 05/29/2016  . GERD (gastroesophageal reflux disease)   . History of blood transfusion 2011   "after one of my OR's"  . History of hiatal hernia   .  History of stomach ulcers   . HSV (herpes simplex virus) infection   . Hx of radiation therapy 05/05/12- 07/15/12   right breast, 45 gray x 25 fx, lumpectomy cavity boosted to 16.2 gray  . Hyperlipemia   . Hypertension    sees Dr. Criss Rosales , [facility] [place]  . Hypothyroidism   . Kidney stones   . Knee pain, bilateral   . Memory difficulty 12/22/2017  . Migraines    "~ 3 times/month" (01/06/2014)  . Obesity   . OSA on CPAP    pt does not know settings  . Pancreatitis    hx of  . Personal history of radiation therapy 02/2012  . Pneumonia 1950's  . Polyneuropathy in diabetes(357.2)   . Schizophrenia ([place])   . 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 Tachycardia    with sx in 2008  . Thyroid disease   . Type II diabetes mellitus ([place])    dx 2014    Past Surgical History:  Procedure Laterality Date  . ABDOMINAL HYSTERECTOMY  1979?   partial  . benign cyst  Left    removed from left breast  . BREAST BIOPSY Right 02/13/2012  . BREAST LUMPECTOMY  03/03/2012   Procedure: LUMPECTOMY;  Surgeon: Haywood Lasso, MD;  Location: [facility];  Service: General;  Laterality: Right;  .  BREAST SURGERY Right 01/2012   "cancer"  . CHOLECYSTECTOMY  1980's?  . COLONOSCOPY WITH PROPOFOL N/A 09/28/2012   Procedure: COLONOSCOPY WITH PROPOFOL;  Surgeon: Lear Ng, MD;  Location: [facility];  Service: Endoscopy;  Laterality: N/A;  . FOOT FRACTURE SURGERY Right 1990's  . JOINT REPLACEMENT      x 3  . LUMBAR LAMINECTOMY/DECOMPRESSION MICRODISCECTOMY N/A 12/05/2016   Procedure: LEFT L5-S1 MICRODISCECTOMY;  Surgeon: Marybelle Killings, MD;  Location: [facility];  Service: Orthopedics;  Laterality: N/A;  . REVISION TOTAL KNEE ARTHROPLASTY  07/2009  . SHOULDER OPEN ROTATOR CUFF REPAIR Left 1990's  . THYROIDECTOMY  1970's  . TOTAL KNEE ARTHROPLASTY Bilateral 2009-06/2009   left; right    Social History   Socioeconomic History  . Marital status: Divorced    Spouse name: Not on file  . Number of  children: 2  . Years of education: 50  . Highest education level: Not on file  Occupational History  . Occupation: Disabled  Tobacco Use  . Smoking status: Former Smoker    Packs/day: 1.00    Years: 7.00    Pack years: 7.00    Types: Cigarettes    Quit date: 03/24/1992    Years since quitting: 27.8  . Smokeless tobacco: Never Used  Vaping Use  . Vaping Use: Never used  Substance and Sexual Activity  . 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 Drug use: No  . Sexual activity: Never    Birth control/protection: Surgical  Other Topics Concern  . Not on file  Social History Narrative   Single. Disabled Biochemist, clinical.          Patient is right handed.    Denies caffeine use    Social Determinants of Radio broadcast assistant Strain:   . Difficulty of Paying Living Expenses: Not on file  Food Insecurity:   . Worried About Charity fundraiser in the Last Year: Not on file  . Ran Out of Food in the Last Year: Not on file  Transportation Needs:   . Lack of Transportation (Medical): Not on file  . Lack of Transportation (Non-Medical): Not on file  Physical Activity:   . Days of Exercise per Week: Not on file  . Minutes of Exercise per Session: Not on file  Stress:   . Feeling of Stress : Not on file  Social Connections:   . Frequency of Communication with Friends and Family: Not on file  . Frequency of Social Gatherings with Friends and Family: Not on file  . Attends Religious Services: Not on file  . Active Member of Clubs or Organizations: Not on file  . Attends Archivist Meetings: Not on file  . Marital Status: Not on file  Intimate Partner Violence:   . Fear of Current or Ex-Partner: Not on file  . Emotionally Abused: Not on file  . Physically Abused: Not on file  . Sexually Abused: Not on file    Current Outpatient Medications on File Prior to Visit  Medication Sig Dispense Refill  . albuterol (PROAIR HFA) 108  (90 Base) MCG/ACT inhaler Inhale 2 puffs into the lungs every 4 (four) hours as needed for wheezing or shortness of breath.     [name] aspirin EC 81 MG tablet Take 1 tablet (81 mg total) by mouth daily. 30 tablet 0  . B Complex-C (B-COMPLEX WITH VITAMIN C) tablet Take 1 tablet by mouth  daily.    . budesonide-formoterol (SYMBICORT) 80-4.5 MCG/ACT inhaler Inhale 2 puffs into the lungs 2 (two) times daily. (Patient taking differently: Inhale 1 puff into the lungs 3 (three) times daily. PRN) 1 Inhaler 11  . calcitRIOL (ROCALTROL) 0.25 MCG capsule Take 1 capsule (0.25 mcg total) by mouth 3 (three) times a week. 12 capsule 2  . Cholecalciferol (VITAMIN D-3) 1000 units CAPS Take 1,000 Units by mouth daily.    . Cyanocobalamin (VITAMIN B-12 PO) Take 1 tablet by mouth daily.    . diphenhydrAMINE (BENADRYL) 25 MG tablet Take 50 mg by mouth every 6 (six) hours as needed for itching or allergies.    [name] doxepin (SINEQUAN) 25 MG capsule Take 50 mg by mouth at bedtime.    [name] esomeprazole (NEXIUM) 40 MG capsule Take 1 capsule (40 mg total) by mouth daily before breakfast. (Patient taking differently: Take 40 mg by mouth daily at 12 noon. ) 30 capsule 1  . furosemide (LASIX) 20 MG tablet Take 2 tablets (40 mg total) by mouth daily. 90 tablet 0  . HUMALOG KWIKPEN 100 UNIT/ML KwikPen INJECT 10 UNITS THREE TIMES DAILY 15 mL 4  . Iron-Vitamins (GERITOL PO) Take 1 tablet by mouth daily.    . Lancets (ONETOUCH DELICA PLUS [id number]) MISC 1 each by Other route 3 (three) times daily. Use to check blood sugar 3 times a day 300 each 11  . levothyroxine (SYNTHROID) 175 MCG tablet Take 1 tablet (175 mcg total) by mouth daily before breakfast. 90 tablet 0  . linaclotide (LINZESS) 290 MCG CAPS capsule Take 290 mcg by mouth daily before breakfast.    . LORazepam (ATIVAN) 0.5 MG tablet Take 0.5 mg by mouth 3 (three) times daily.    [name] losartan-hydrochlorothiazide (HYZAAR) 100-12.5 MG tablet Take 1 tablet by mouth daily.    . methocarbamol  (ROBAXIN) 500 MG tablet Take 1 tablet (500 mg total) by mouth every 8 (eight) hours as needed for muscle spasms. 30 tablet 0  . Multiple Vitamin (MULTIVITAMIN WITH MINERALS) TABS tablet Take 1 tablet by mouth daily.    Glory Rosebush VERIO test strip TEST BLOOD SUGAR THREE TIMES DAILY 100 strip 11  . oxyCODONE (ROXICODONE) 15 MG immediate release tablet Take 15 mg by mouth every 6 (six) hours as needed for pain.     . pregabalin (LYRICA) 300 MG capsule Take 300 mg by mouth 2 (two) times daily.    . rosuvastatin (CRESTOR) 20 MG tablet Take 20 mg by mouth daily.    . SURE COMFORT PEN NEEDLES 32G X 6 MM MISC USE TO INJECT SUBCUTANEOUSLY (four) times daily. E11.9] 200 each 1  . tiZANidine (ZANAFLEX) 4 MG tablet Take 4 mg by mouth 2 (two) times daily.     . valACYclovir (VALTREX) 1000 MG tablet Take 1,000 mg by mouth 2 (two) times daily.     [name] venlafaxine XR (EFFEXOR-XR) 150 MG 24 hr capsule Take 150 mg by mouth 2 (two) times daily.    . ziprasidone (GEODON) 60 MG capsule Take 60 mg by mouth daily.     No current facility-administered medications on file prior to visit.    Allergies  Allergen Reactions  . Bee Venom Anaphylaxis  . Invokana [Canagliflozin] Anaphylaxis    Dyspnea and urinary retention  . Shellfish Allergy Anaphylaxis  . Buprenorphine Hcl Other (See Comments)    Overly sedated with morphine drip.  . Other Other (See Comments)    Cats - bad asthma, stopped up  Family History  Problem Relation Age of Onset  . Heart disease Brother        Multiple MIs, starting in his 42s  . Diabetes Brother   . Thyroid disease Brother   . Hypertension Mother   . Diabetes Mother   . Breast cancer Mother 54  . Bone cancer Mother   . Hypertension Sister   . Diabetes Sister   . Breast cancer Sister 39  . Thyroid disease Sister   . Breast cancer Maternal Grandmother   . Heart disease Maternal Grandmother   . Uterine cancer Other 19  . Breast cancer Paternal Aunt 53  . Breast cancer  Paternal Grandmother        dx in her 56s  . Prostate cancer Paternal Grandfather   . Asthma Son     BP 122/68   Pulse (!) 101   SpO2 97%   Review of Systems She denies hypoglycemia and nausea.      Objective:   Physical Exam VITAL SIGNS:  See vs page GENERAL: no distress Pulses: dorsalis pedis intact bilat.   MSK: no deformity of the feet CV: 2+ bilat leg edema Skin:  no ulcer on the feet.  normal color and temp on the feet. Neuro: sensation is intact to touch on the feet, but decreased from normal.    Lab Results  Component Value Date   CREATININE 1.47 (H) 06/15/2019   BUN 14 06/15/2019   NA 138 06/15/2019   K 4.5 06/15/2019   CL 102 06/15/2019   CO2 25 06/15/2019   Lab Results  Component Value Date   HGBA1C 8.1 (A) 01/04/2020       Assessment & Plan:  Insulin-requiring type 2 DM, with stage 3 CRI: uncontrolled.  Hypothyroidism: recheck today.   Vit-D def: recheck today  Patient Instructions  Please continue the same humalog, and:  I have sent a prescription to your pharmacy, to increase the Trulicity again.   Blood tests are requested for you today.  We'll let you know about the results.  check your blood sugar twice a day.  vary the time of day when you check, between before the 3 meals, and at bedtime.  also check if you have symptoms of your blood sugar being too high or too low.  please keep a record of the readings and bring it to your next appointment here (or you can bring the meter itself).  You can write it on any piece of paper.  please call us sooner if your blood sugar goes below 70, or if you have a lot of readings over 200. Please come back for a follow-up appointment in 2 months.

## 2020-01-05 LAB — PTH, INTACT AND CALCIUM
Calcium: 9.6 mg/dL (ref 8.6–10.4)
PTH: 55 pg/mL (ref 14–64)

## 2020-01-14 ENCOUNTER — Other Ambulatory Visit: Payer: Self-pay | Admitting: Endocrinology

## 2020-01-14 DIAGNOSIS — N2581 Secondary hyperparathyroidism of renal origin: Secondary | ICD-10-CM

## 2020-02-27 IMAGING — MG DIGITAL SCREENING BILATERAL MAMMOGRAM WITH TOMO AND CAD
6 of 12 series · 6 of 36 positions shown · non-contrast
Comparison: Previous exam(s).

CLINICAL DATA: Screening.

EXAM:
DIGITAL SCREENING BILATERAL MAMMOGRAM WITH TOMO AND CAD

[L MLO synth-2D (1 of 2)]
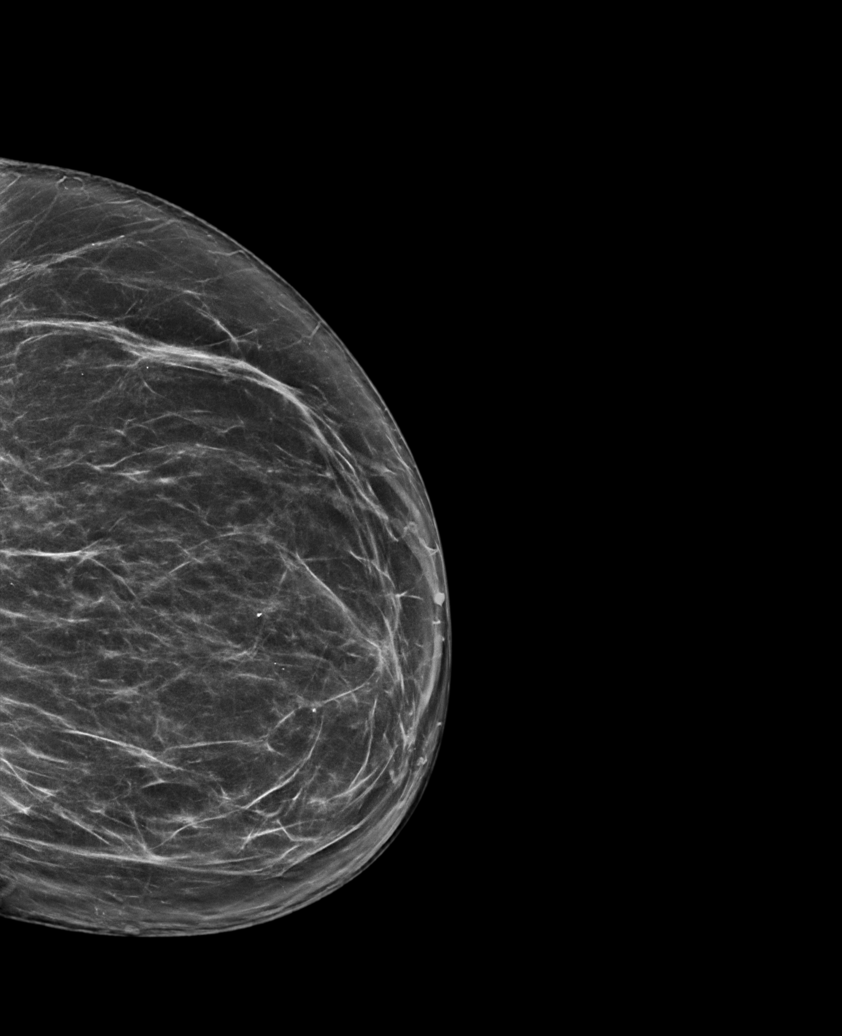

[L CC synth-2D]
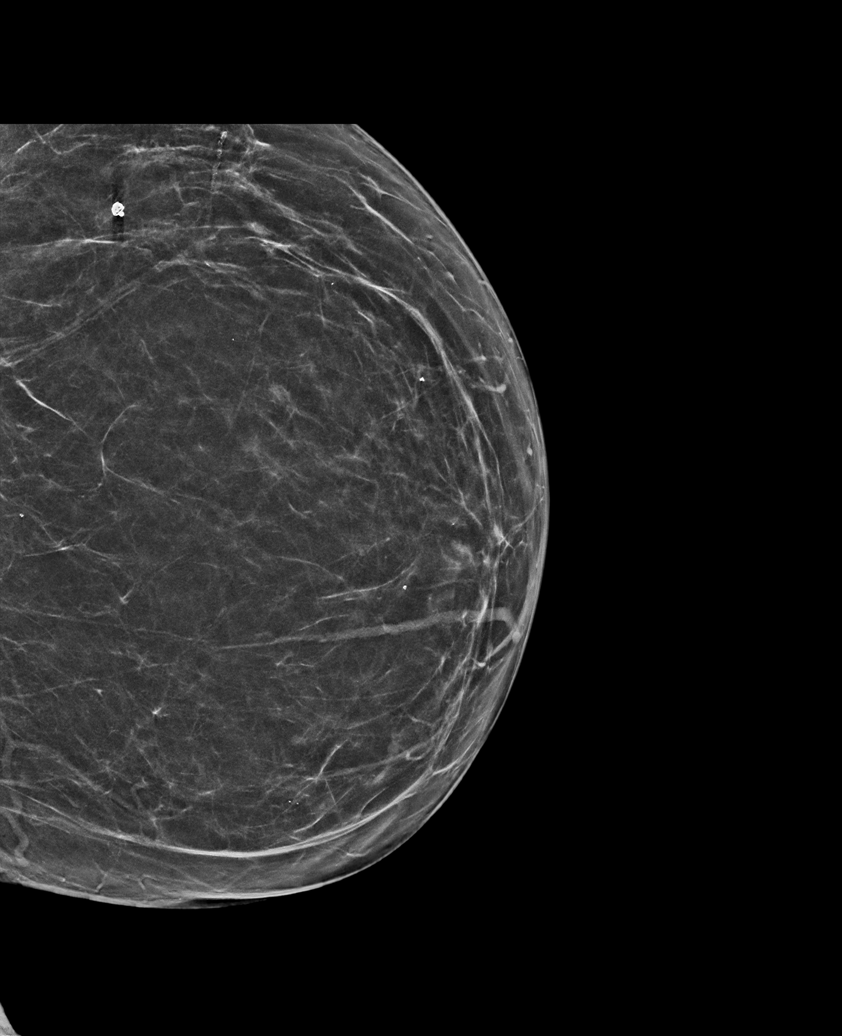

[L XCCL synth-2D]
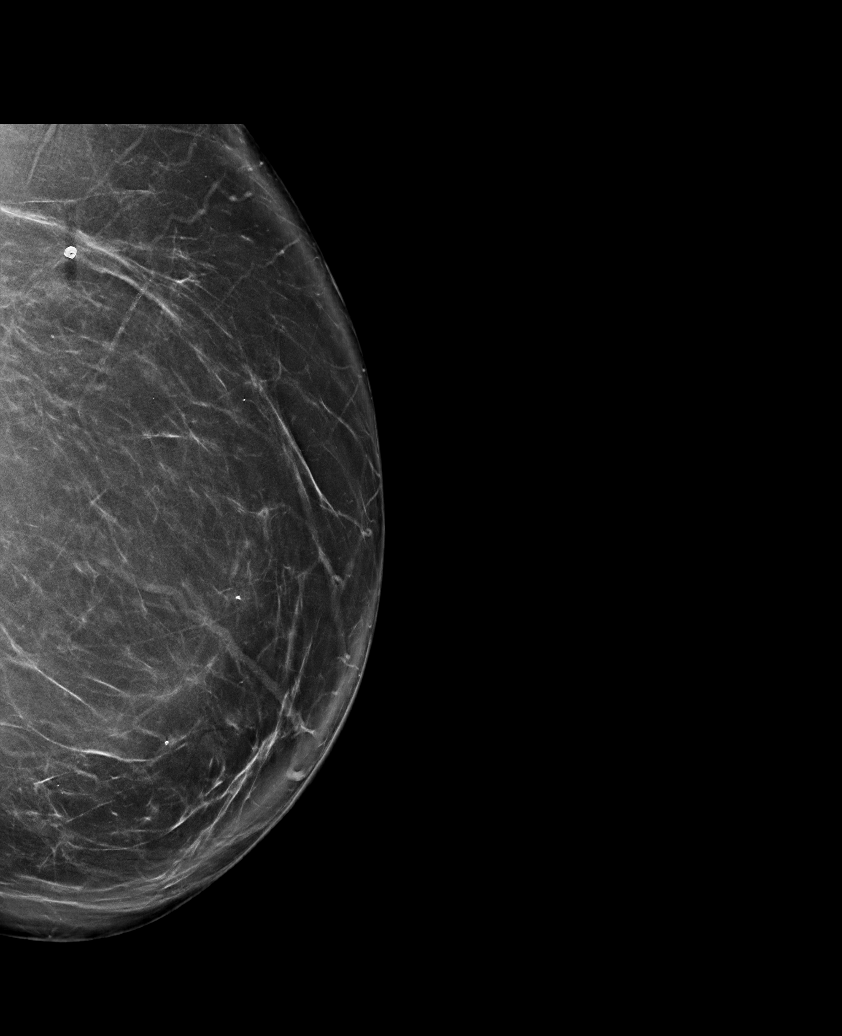

[R CC synth-2D]
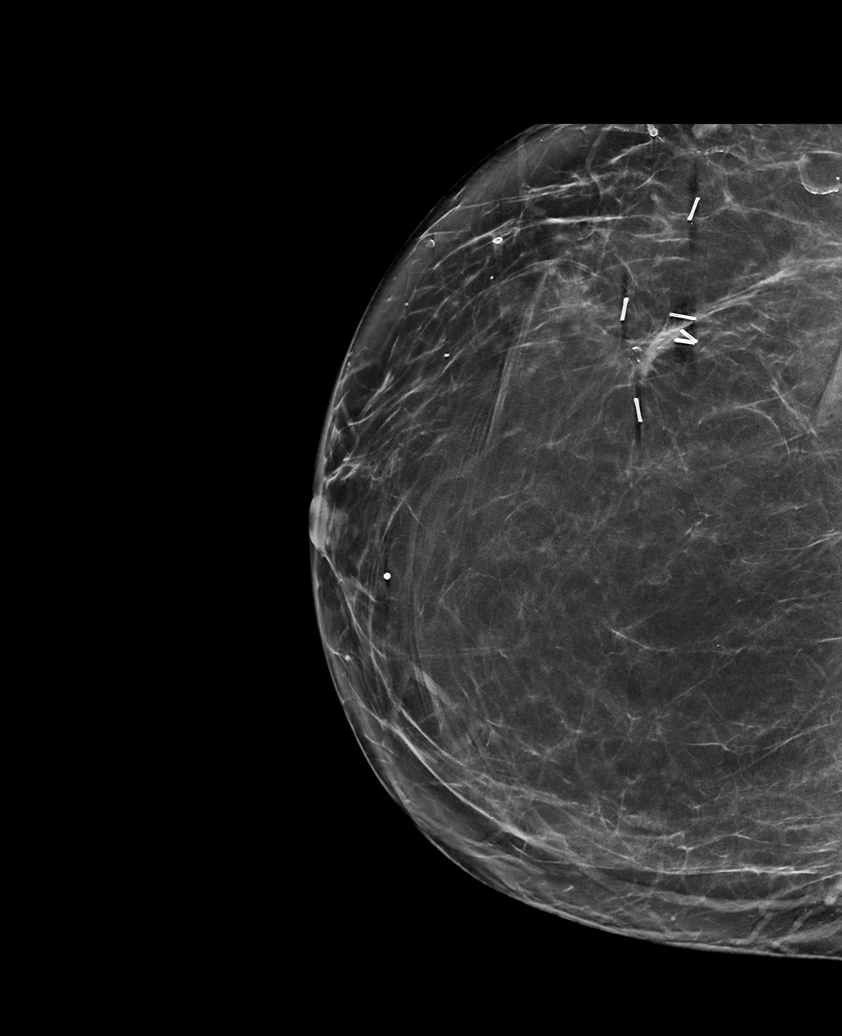

[L MLO synth-2D (2 of 2)]
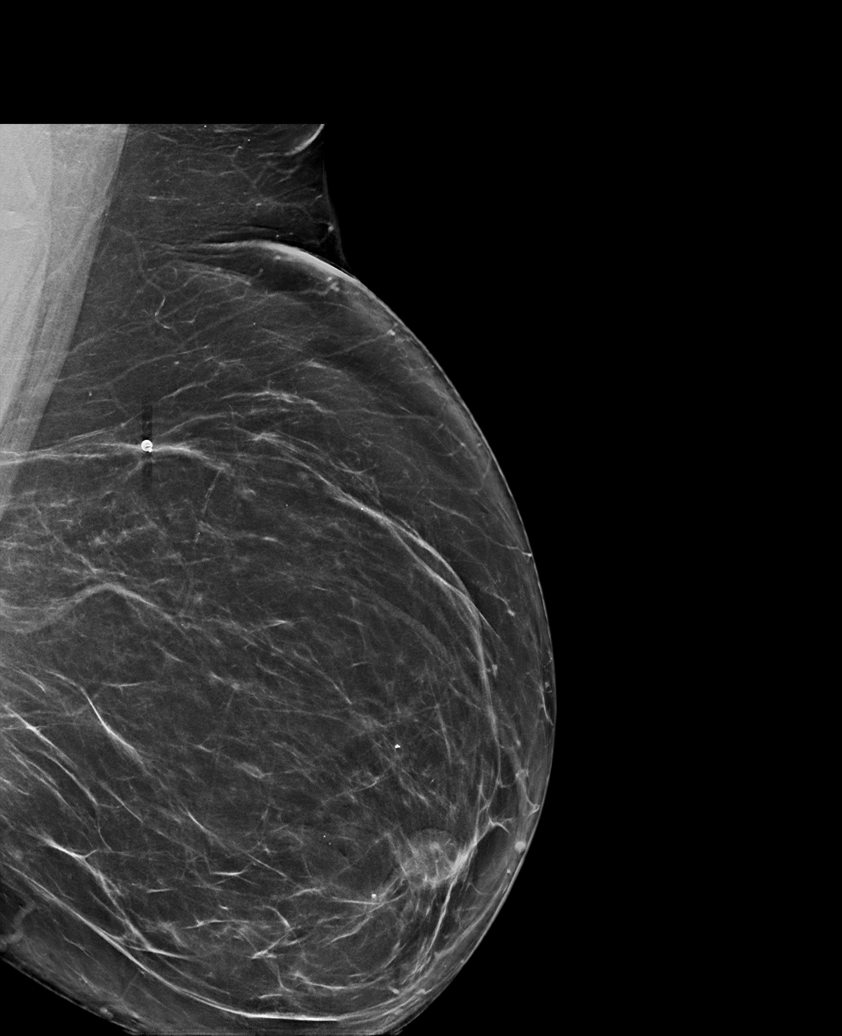

[R MLO synth-2D]
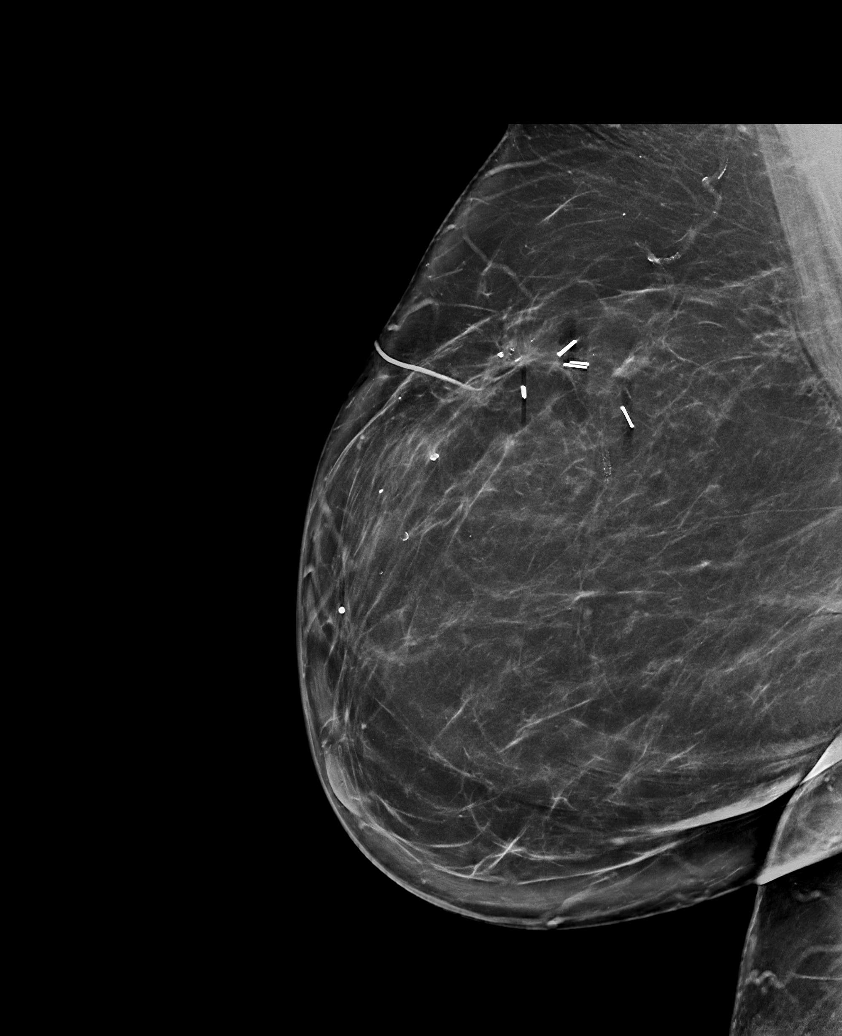

[6 of 36 positions shown; findings below may reference images not displayed]

ACR Breast Density Category b: There are scattered areas of
fibroglandular density.
FINDINGS: There are no findings suspicious for malignancy. Images were
processed with CAD.
IMPRESSION: No mammographic evidence of malignancy. A result letter of this
screening mammogram will be mailed directly to the patient.

RECOMMENDATION:
Screening mammogram in one year. (Code:[id number])

BI-RADS CATEGORY  1: Negative.

## 2020-03-06 ENCOUNTER — Ambulatory Visit (INDEPENDENT_AMBULATORY_CARE_PROVIDER_SITE_OTHER): Payer: Medicare Other | Admitting: Endocrinology

## 2020-03-06 ENCOUNTER — Other Ambulatory Visit: Payer: Self-pay | Admitting: Endocrinology

## 2020-03-06 ENCOUNTER — Other Ambulatory Visit: Payer: Self-pay

## 2020-03-06 VITALS — BP 138/84 | HR 100 | Ht 65.0 in

## 2020-03-06 DIAGNOSIS — Z794 Long term (current) use of insulin: Secondary | ICD-10-CM | POA: Diagnosis not present

## 2020-03-06 DIAGNOSIS — E118 Type 2 diabetes mellitus with unspecified complications: Secondary | ICD-10-CM

## 2020-03-06 LAB — POCT GLYCOSYLATED HEMOGLOBIN (HGB A1C): Hemoglobin A1C: 8.5 % — AB (ref 4.0–5.6)

## 2020-03-06 MED ORDER — HUMULIN N KWIKPEN 100 UNIT/ML ~~LOC~~ SUPN: 25.0000 [IU] | PEN_INJECTOR | SUBCUTANEOUS | 11 refills | Status: DC

## 2020-03-06 NOTE — Patient Instructions (Addendum)
Please continue the same Trulicity, and:  I have sent a prescription to your pharmacy, to change the humalog to NPH, 25 units each morning.  check your blood sugar twice a day.  vary the time of day when you check, between before the 3 meals, and at bedtime.  also check if you have symptoms of your blood sugar being too high or too low.  please keep a record of the readings and bring it to your next appointment here (or you can bring the meter itself).  You can write it on any piece of paper.  please call us sooner if your blood sugar goes below 70, or if you have a lot of readings over 200.   Please reduce the vitamin-D to 1 pill of 2000 units per day.  Please come back for a follow-up appointment in 2 months.

## 2020-03-06 NOTE — Progress Notes (Signed)
Subjective:    Patient ID: [name], female    DOB: [birth year], 65 y.o.   MRN: [id number]  HPI Pt returns for f/u of postsurgical hypothyroidism (she had thyroidectomy in approx 1988, for a goiter; she says no cancer was found; she has been on prescribed thyroid hormone therapy since then; she has not recently had thyroid imaging; she does not know why TSH fluctuates, but med noncompliance has been noted; she takes synthroid as rx'ed; MRI (2018) showed normal orbits, and no mention was made of the pituitary).  She tak Since synthroid was increased, she still has fatigue.   Pt also returns for f/u of diabetes mellitus:  DM type: Insulin-requiring type 2.   Dx'ed: 2015.  Complications: stage 3 CRI, PN, and TIA.  Therapy: insulin since 1740, and Trulicity.    GDM: never.  DKA: never Severe hypoglycemia: never.   Pancreatitis: never Pancreatic imaging: Fatty atrophy (2018 CT).   Other: she takes multiple daily injections; she did not tolerate Invokana (sob).   Interval history: no cbg record, but states cbg varies from 148-200's.  There is no trend throughout the day.  Pt says she averages 2 doses of humalog per day., and would like to reduce to 1 per day.   She takes non-rx vit-D, 4000 units/day.   Past Medical History:  Diagnosis Date  . Anemia   . Anxiety   . Arthritis    "pretty much all my joints"  . Asthma   . Benign paroxysmal positional vertigo 12/30/2012  . Breast cancer ([place]) 02/13/12   ruq  100'clock bx Ductal Carcinoma in Situ,(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  . Breast mass in female    L breast 2008, US showed likely fat necrosis  . Chronic lower back pain   . CKD (chronic kidney disease), stage III ([place])    "lower stage" (01/06/2014)  . Daily headache    last time was 09/20/2016 (brother passed away)  . Depression   . Dyspnea   . Gait abnormality 05/29/2016  . GERD (gastroesophageal reflux disease)   . History of blood transfusion 2011   "after one of my OR's"  . History  of hiatal hernia   . History of stomach ulcers   . HSV (herpes simplex virus) infection   . Hx of radiation therapy 05/05/12- 07/15/12   right breast, 45 gray x 25 fx, lumpectomy cavity boosted to 16.2 gray  . Hyperlipemia   . Hypertension    sees Dr. Criss Rosales , [facility]  . Hypothyroidism   . Kidney stones   . Knee pain, bilateral   . Memory difficulty 12/22/2017  . Migraines    "~ 3 times/month" (01/06/2014)  . Obesity   . OSA on CPAP    pt does not know settings  . Pancreatitis    hx of  . Personal history of radiation therapy 02/2012  . Pneumonia 1950's  . Polyneuropathy in diabetes(357.2)   . Schizophrenia ([place])   . 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 Tachycardia    with sx in 2008  . Thyroid disease   . Type II diabetes mellitus ([place])    dx 2014    Past Surgical History:  Procedure Laterality Date  . ABDOMINAL HYSTERECTOMY  1979?   partial  . benign cyst  Left    removed from left breast  . BREAST BIOPSY Right 02/13/2012  . BREAST LUMPECTOMY  03/03/2012   Procedure: LUMPECTOMY;  Surgeon: Haywood Lasso, MD;  Location: [facility];  Service: General;  Laterality: Right;  . BREAST SURGERY Right 01/2012   "cancer"  . CHOLECYSTECTOMY  1980's?  . COLONOSCOPY WITH PROPOFOL N/A 09/28/2012   Procedure: COLONOSCOPY WITH PROPOFOL;  Surgeon: Lear Ng, MD;  Location: [facility];  Service: Endoscopy;  Laterality: N/A;  . FOOT FRACTURE SURGERY Right 1990's  . JOINT REPLACEMENT      x 3  . LUMBAR LAMINECTOMY/DECOMPRESSION MICRODISCECTOMY N/A 12/05/2016   Procedure: LEFT L5-S1 MICRODISCECTOMY;  Surgeon: Marybelle Killings, MD;  Location: [facility];  Service: Orthopedics;  Laterality: N/A;  . REVISION TOTAL KNEE ARTHROPLASTY  07/2009  . SHOULDER OPEN ROTATOR CUFF REPAIR Left 1990's  . THYROIDECTOMY  1970's  . TOTAL KNEE ARTHROPLASTY Bilateral 2009-06/2009   left; right    Social History   Socioeconomic History  . Marital status: Divorced    Spouse name: Not on  file  . Number of children: 2  . Years of education: 70  . Highest education level: Not on file  Occupational History  . Occupation: Disabled  Tobacco Use  . Smoking status: Former Smoker    Packs/day: 1.00    Years: 7.00    Pack years: 7.00    Types: Cigarettes    Quit date: 03/24/1992    Years since quitting: 27.9  . Smokeless tobacco: Never Used  Vaping Use  . Vaping Use: Never used  Substance and Sexual Activity  . 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 Drug use: No  . Sexual activity: Never    Birth control/protection: Surgical  Other Topics Concern  . Not on file  Social History Narrative   Single. Disabled Biochemist, clinical.          Patient is right handed.    Denies caffeine use    Social Determinants of Radio broadcast assistant Strain: Not on file  Food Insecurity: Not on file  Transportation Needs: Not on file  Physical Activity: Not on file  Stress: Not on file  Social Connections: Not on file  Intimate Partner Violence: Not on file    Current Outpatient Medications on File Prior to Visit  Medication Sig Dispense Refill  . albuterol (VENTOLIN HFA) 108 (90 Base) MCG/ACT inhaler Inhale 2 puffs into the lungs every 4 (four) hours as needed for wheezing or shortness of breath.     [name] aspirin EC 81 MG tablet Take 1 tablet (81 mg total) by mouth daily. 30 tablet 0  . B Complex-C (B-COMPLEX WITH VITAMIN C) tablet Take 1 tablet by mouth daily.    . budesonide-formoterol (SYMBICORT) 80-4.5 MCG/ACT inhaler Inhale 2 puffs into the lungs 2 (two) times daily. (Patient taking differently: Inhale 1 puff into the lungs 3 (three) times daily. PRN) 1 Inhaler 11  . calcitRIOL (ROCALTROL) 0.25 MCG capsule Take 1 capsule (0.25 mcg total) by mouth 3 (three) times a week. 12 capsule 2  . Cholecalciferol (VITAMIN D-3) 1000 units CAPS Take 1,000 Units by mouth daily.    . Cyanocobalamin (VITAMIN B-12 PO) Take 1 tablet by mouth daily.    .  diphenhydrAMINE (BENADRYL) 25 MG tablet Take 50 mg by mouth every 6 (six) hours as needed for itching or allergies.    [name] doxepin (SINEQUAN) 25 MG capsule Take 50 mg by mouth at bedtime.    . Dulaglutide (TRULICITY) 4.5 PP/5.0DT SOPN Inject 4.5 mg into the skin once a week. 6 mL 3  . esomeprazole (NEXIUM) 40 MG capsule  Take 1 capsule (40 mg total) by mouth daily before breakfast. (Patient taking differently: Take 40 mg by mouth daily at 12 noon.) 30 capsule 1  . furosemide (LASIX) 20 MG tablet Take 2 tablets (40 mg total) by mouth daily. 90 tablet 0  . Iron-Vitamins (GERITOL PO) Take 1 tablet by mouth daily.    . Lancets (ONETOUCH DELICA PLUS [id number]) MISC 1 each by Other route 3 (three) times daily. Use to check blood sugar 3 times a day 300 each 11  . levothyroxine (SYNTHROID) 175 MCG tablet Take 1 tablet (175 mcg total) by mouth daily before breakfast. 90 tablet 0  . linaclotide (LINZESS) 290 MCG CAPS capsule Take 290 mcg by mouth daily before breakfast.    . LORazepam (ATIVAN) 0.5 MG tablet Take 0.5 mg by mouth 3 (three) times daily.    [name] losartan-hydrochlorothiazide (HYZAAR) 100-12.5 MG tablet Take 1 tablet by mouth daily.    . methocarbamol (ROBAXIN) 500 MG tablet Take 1 tablet (500 mg total) by mouth every 8 (eight) hours as needed for muscle spasms. 30 tablet 0  . Multiple Vitamin (MULTIVITAMIN WITH MINERALS) TABS tablet Take 1 tablet by mouth daily.    Glory Rosebush VERIO test strip TEST BLOOD SUGAR THREE TIMES DAILY 100 strip 11  . oxyCODONE (ROXICODONE) 15 MG immediate release tablet Take 15 mg by mouth every 6 (six) hours as needed for pain.     . pregabalin (LYRICA) 300 MG capsule Take 300 mg by mouth 2 (two) times daily.    . rosuvastatin (CRESTOR) 20 MG tablet Take 20 mg by mouth daily.    [name] tiZANidine (ZANAFLEX) 4 MG tablet Take 4 mg by mouth 2 (two) times daily.     . valACYclovir (VALTREX) 1000 MG tablet Take 1,000 mg by mouth 2 (two) times daily.     [name] venlafaxine XR (EFFEXOR-XR)  150 MG 24 hr capsule Take 150 mg by mouth 2 (two) times daily.    . ziprasidone (GEODON) 60 MG capsule Take 60 mg by mouth daily.     No current facility-administered medications on file prior to visit.    Allergies  Allergen Reactions  . Bee Venom Anaphylaxis  . Invokana [Canagliflozin] Anaphylaxis    Dyspnea and urinary retention  . Shellfish Allergy Anaphylaxis  . Buprenorphine Hcl Other (See Comments)    Overly sedated with morphine drip.  . Other Other (See Comments)    Cats - bad asthma, stopped up    Family History  Problem Relation Age of Onset  . Heart disease Brother        Multiple MIs, starting in his 64s  . Diabetes Brother   . Thyroid disease Brother   . Hypertension Mother   . Diabetes Mother   . Breast cancer Mother 58  . Bone cancer Mother   . Hypertension Sister   . Diabetes Sister   . Breast cancer Sister 87  . Thyroid disease Sister   . Breast cancer Maternal Grandmother   . Heart disease Maternal Grandmother   . Uterine cancer Other 19  . Breast cancer Paternal Aunt 72  . Breast cancer Paternal Grandmother        dx in her 33s  . Prostate cancer Paternal Grandfather   . Asthma Son     BP 138/84 (BP Location: Left Arm, Patient Position: Sitting, Cuff Size: Large) Comment (BP Location): forearm  Pulse 100   Ht _0  (1.651 m)   SpO2 96%   BMI 62.40 kg/m  Review of Systems She denies hypoglycemia.      Objective:   Physical Exam VITAL SIGNS:  See vs page GENERAL: no distress.  In wheelchair.     Lab Results  Component Value Date   HGBA1C 8.5 (A) 03/06/2020   Lab Results  Component Value Date   CREATININE 1.47 (H) 06/15/2019   BUN 14 06/15/2019   NA 138 06/15/2019   K 4.5 06/15/2019   CL 102 06/15/2019   CO2 25 06/15/2019      Assessment & Plan:  Insulin-requiring type 2 DM, with stage 3 CRI: uncontrolled.   Vit-D def: overeplaced.   Patient Instructions  Please continue the same Trulicity, and:  I have sent a  prescription to your pharmacy, to change the humalog to NPH, 25 units each morning.  check your blood sugar twice a day.  vary the time of day when you check, between before the 3 meals, and at bedtime.  also check if you have symptoms of your blood sugar being too high or too low.  please keep a record of the readings and bring it to your next appointment here (or you can bring the meter itself).  You can write it on any piece of paper.  please call us sooner if your blood sugar goes below 70, or if you have a lot of readings over 200.   Please reduce the vitamin-D to 1 pill of 2000 units per day.  Please come back for a follow-up appointment in 2 months.

## 2020-03-07 ENCOUNTER — Other Ambulatory Visit: Payer: Self-pay | Admitting: Endocrinology

## 2020-03-07 DIAGNOSIS — E119 Type 2 diabetes mellitus without complications: Secondary | ICD-10-CM

## 2020-03-07 DIAGNOSIS — Z794 Long term (current) use of insulin: Secondary | ICD-10-CM

## 2020-03-29 ENCOUNTER — Other Ambulatory Visit: Payer: Self-pay | Admitting: Endocrinology

## 2020-03-29 NOTE — Telephone Encounter (Signed)
Last OV 1/22.Marland KitchenMarland KitchenRx janumet not on the medlist. Please advise

## 2020-04-16 ENCOUNTER — Other Ambulatory Visit: Payer: Self-pay | Admitting: Endocrinology

## 2020-04-17 NOTE — Telephone Encounter (Signed)
Please advise on thyroid medication. Last seen on 03/06/2020 for DM.

## 2020-04-17 NOTE — Telephone Encounter (Signed)
MEDICATION: Janumet and Levothyroxine  PHARMACY:  [facility] [address] CONTACTED [facility]?  yes  IS THIS A 90 DAY SUPPLY :   IS PATIENT OUT OF MEDICATION: yes  IF NOT; HOW MUCH IS LEFT:   LAST APPOINTMENT DATE: _0 /11/2020  NEXT APPOINTMENT DATE:_1 /22/2022  DO WE HAVE YOUR PERMISSION TO LEAVE A DETAILED MESSAGE?:  OTHER COMMENTS:    **Let patient know to contact pharmacy at the end of the day to make sure medication is ready. **  ** Please notify patient to allow 48-72 hours to process**  **Encourage patient to contact the pharmacy for refills or they can request refills through [facility]**

## 2020-04-20 ENCOUNTER — Other Ambulatory Visit: Payer: Self-pay | Admitting: Endocrinology

## 2020-04-20 NOTE — Telephone Encounter (Signed)
Pt called again. See below

## 2020-04-20 NOTE — Telephone Encounter (Signed)
Pt called to request a 90 day prescription for Janumet be sent to   [facility], [place] Phone:  [phone]  Fax:  [phone]     Pt states she has been out for almost 2 weeks.

## 2020-04-21 ENCOUNTER — Other Ambulatory Visit: Payer: Self-pay | Admitting: *Deleted

## 2020-04-21 NOTE — Telephone Encounter (Signed)
We have that pt is no longer on this med.  Is she taking it?

## 2020-04-21 NOTE — Telephone Encounter (Signed)
Pt requesting Janumet medication which not on the medication list. Please advise

## 2020-04-27 ENCOUNTER — Other Ambulatory Visit: Payer: Self-pay | Admitting: Endocrinology

## 2020-04-27 DIAGNOSIS — Z794 Long term (current) use of insulin: Secondary | ICD-10-CM

## 2020-05-08 ENCOUNTER — Ambulatory Visit (INDEPENDENT_AMBULATORY_CARE_PROVIDER_SITE_OTHER): Payer: Medicare Other | Admitting: Endocrinology

## 2020-05-08 ENCOUNTER — Other Ambulatory Visit: Payer: Self-pay

## 2020-05-08 VITALS — BP 118/74 | HR 96 | Ht 65.0 in

## 2020-05-08 DIAGNOSIS — E118 Type 2 diabetes mellitus with unspecified complications: Secondary | ICD-10-CM | POA: Diagnosis not present

## 2020-05-08 DIAGNOSIS — N2581 Secondary hyperparathyroidism of renal origin: Secondary | ICD-10-CM

## 2020-05-08 DIAGNOSIS — E119 Type 2 diabetes mellitus without complications: Secondary | ICD-10-CM

## 2020-05-08 DIAGNOSIS — Z794 Long term (current) use of insulin: Secondary | ICD-10-CM | POA: Diagnosis not present

## 2020-05-08 LAB — BASIC METABOLIC PANEL
BUN: 21 mg/dL (ref 6–23)
CO2: 32 mEq/L (ref 19–32)
Calcium: 10 mg/dL (ref 8.4–10.5)
Chloride: 96 mEq/L (ref 96–112)
Creatinine, Ser: 1.37 mg/dL — ABNORMAL HIGH (ref 0.40–1.20)
GFR: 40.95 mL/min — ABNORMAL LOW (ref >60.00)
Glucose, Bld: 183 mg/dL — ABNORMAL HIGH (ref 70–99)
Potassium: 4.2 mEq/L (ref 3.5–5.1)
Sodium: 137 mEq/L (ref 135–145)

## 2020-05-08 LAB — VITAMIN D 25 HYDROXY (VIT D DEFICIENCY, FRACTURES): VITD: 77.34 ng/mL (ref 30.00–100.00)

## 2020-05-08 LAB — T4, FREE: Free T4: 0.6 ng/dL (ref 0.60–1.60)

## 2020-05-08 LAB — TSH: TSH: 7.74 u[IU]/mL — ABNORMAL HIGH (ref 0.35–4.50)

## 2020-05-08 MED ORDER — HUMULIN N KWIKPEN 100 UNIT/ML ~~LOC~~ SUPN: 28.0000 [IU] | PEN_INJECTOR | SUBCUTANEOUS | 11 refills | Status: DC

## 2020-05-08 NOTE — Patient Instructions (Addendum)
Please continue the same Trulicity, and:  Increase the NPH insulin to 28 units units each morning.  check your blood sugar twice a day.  vary the time of day when you check, between before the 3 meals, and at bedtime.  also check if you have symptoms of your blood sugar being too high or too low.  please keep a record of the readings and bring it to your next appointment here (or you can bring the meter itself).  You can write it on any piece of paper.  please call us sooner if your blood sugar goes below 70, or if you have a lot of readings over 200.   Please reduce the vitamin-D to 1 pill of 2000 units per day.  Please come back for a follow-up appointment in 2 months.

## 2020-05-08 NOTE — Progress Notes (Signed)
Subjective:    Patient ID: [name], female    DOB: 07-27-1955, 65 y.o.   MRN: [id number]  HPI Pt returns for f/u of postsurgical hypothyroidism (she had thyroidectomy in approx 1988, for a goiter; she says no cancer was found; she has been on prescribed thyroid hormone therapy since then; she has not recently had thyroid imaging; she does not know why TSH fluctuates, but med noncompliance has been noted; she takes synthroid as rx'ed; MRI (2018) showed normal orbits, and no mention was made of the pituitary).  Since synthroid was increased, she still has fatigue.   Pt also returns for f/u of diabetes mellitus:  DM type: Insulin-requiring type 2.   Dx'ed: 2015.  Complications: stage 3 CRI, PN, and TIA.  Therapy: insulin since 2119, and Trulicity.    GDM: never.  DKA: never Severe hypoglycemia: never.   Pancreatitis: never Pancreatic imaging: Fatty atrophy (2018 CT).   Other: she takes multiple daily injections; she did not tolerate Invokana (sob); She declined to continue multiple daily injections.  Interval history: no cbg record, but states cbg varies from 119-179.  There is no trend throughout the day.    She takes non-rx Vit-D, at an uncertain dosage.   Past Medical History:  Diagnosis Date  . Anemia   . Anxiety   . Arthritis    "pretty much all my joints"  . Asthma   . Benign paroxysmal positional vertigo 12/30/2012  . Breast cancer ([place]) 02/13/12   ruq  100'clock bx Ductal Carcinoma in Situ,(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  . Breast mass in female    L breast 2008, US showed likely fat necrosis  . Chronic lower back pain   . CKD (chronic kidney disease), stage III ([place])    "lower stage" (01/06/2014)  . Daily headache    last time was Aug 26, 2016 (brother passed away)  . Depression   . Dyspnea   . Gait abnormality 05/29/2016  . GERD (gastroesophageal reflux disease)   . History of blood transfusion 2011   "after one of my OR's"  . History of hiatal hernia   . History of  stomach ulcers   . HSV (herpes simplex virus) infection   . Hx of radiation therapy 05/05/12- 07/15/12   right breast, 45 gray x 25 fx, lumpectomy cavity boosted to 16.2 gray  . Hyperlipemia   . Hypertension    sees Dr. Criss Rosales , [facility]  . Hypothyroidism   . Kidney stones   . Knee pain, bilateral   . Memory difficulty 12/22/2017  . Migraines    "~ 3 times/month" (01/06/2014)  . Obesity   . OSA on CPAP    pt does not know settings  . Pancreatitis    hx of  . Personal history of radiation therapy 02/2012  . Pneumonia 1950's  . Polyneuropathy in diabetes(357.2)   . Schizophrenia ([place])   . 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 Tachycardia    with sx in 2008  . Thyroid disease   . Type II diabetes mellitus ([place])    dx 2014    Past Surgical History:  Procedure Laterality Date  . ABDOMINAL HYSTERECTOMY  1979?   partial  . benign cyst  Left    removed from left breast  . BREAST BIOPSY Right 02/13/2012  . BREAST LUMPECTOMY  03/03/2012   Procedure: LUMPECTOMY;  Surgeon: Haywood Lasso, MD;  Location: [facility];  Service: General;  Laterality: Right;  . BREAST SURGERY Right 01/2012   "  cancer"  . CHOLECYSTECTOMY  1980's?  . COLONOSCOPY WITH PROPOFOL N/A 09/28/2012   Procedure: COLONOSCOPY WITH PROPOFOL;  Surgeon: Lear Ng, MD;  [facility];  Service: Endoscopy;  Laterality: N/A;  . FOOT FRACTURE SURGERY Right 1990's  . JOINT REPLACEMENT      x 3  . LUMBAR LAMINECTOMY/DECOMPRESSION MICRODISCECTOMY N/A 12/05/2016   Procedure: LEFT L5-S1 MICRODISCECTOMY;  Surgeon: Marybelle Killings, MD;  Location: [facility];  Service: Orthopedics;  Laterality: N/A;  . REVISION TOTAL KNEE ARTHROPLASTY  07/2009  . SHOULDER OPEN ROTATOR CUFF REPAIR Left 1990's  . THYROIDECTOMY  1970's  . TOTAL KNEE ARTHROPLASTY Bilateral 2009-06/2009   left; right    Social History   Socioeconomic History  . Marital status: Divorced    Spouse name: Not on file  . Number of children: 2  .  Years of education: 57  . Highest education level: Not on file  Occupational History  . Occupation: Disabled  Tobacco Use  . Smoking status: Former Smoker    Packs/day: 1.00    Years: 7.00    Pack years: 7.00    Types: Cigarettes    Quit date: 03/24/1992    Years since quitting: 28.1  . Smokeless tobacco: Never Used  Vaping Use  . Vaping Use: Never used  Substance and Sexual Activity  . 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 Drug use: No  . Sexual activity: Never    Birth control/protection: Surgical  Other Topics Concern  . Not on file  Social History Narrative   Single. Disabled Biochemist, clinical.          Patient is right handed.    Denies caffeine use    Social Determinants of Radio broadcast assistant Strain: Not on file  Food Insecurity: Not on file  Transportation Needs: Not on file  Physical Activity: Not on file  Stress: Not on file  Social Connections: Not on file  Intimate Partner Violence: Not on file    Current Outpatient Medications on File Prior to Visit  Medication Sig Dispense Refill  . albuterol (VENTOLIN HFA) 108 (90 Base) MCG/ACT inhaler Inhale 2 puffs into the lungs every 4 (four) hours as needed for wheezing or shortness of breath.     [name] aspirin EC 81 MG tablet Take 1 tablet (81 mg total) by mouth daily. 30 tablet 0  . B Complex-C (B-COMPLEX WITH VITAMIN C) tablet Take 1 tablet by mouth daily.    . budesonide-formoterol (SYMBICORT) 80-4.5 MCG/ACT inhaler Inhale 2 puffs into the lungs 2 (two) times daily. (Patient taking differently: Inhale 1 puff into the lungs 3 (three) times daily. PRN) 1 Inhaler 11  . calcitRIOL (ROCALTROL) 0.25 MCG capsule Take 1 capsule (0.25 mcg total) by mouth 3 (three) times a week. 12 capsule 2  . Cholecalciferol (VITAMIN D-3) 1000 units CAPS Take 1,000 Units by mouth daily.    . Cyanocobalamin (VITAMIN B-12 PO) Take 1 tablet by mouth daily.    . diphenhydrAMINE (BENADRYL) 25 MG  tablet Take 50 mg by mouth every 6 (six) hours as needed for itching or allergies.    [name] doxepin (SINEQUAN) 25 MG capsule Take 50 mg by mouth at bedtime.    . Dulaglutide (TRULICITY) 4.5 GY/6.9SW SOPN Inject 4.5 mg into the skin once a week. 6 mL 3  . esomeprazole (NEXIUM) 40 MG capsule Take 1 capsule (40 mg total) by mouth daily before breakfast. (Patient taking  differently: Take 40 mg by mouth daily at 12 noon.) 30 capsule 1  . furosemide (LASIX) 20 MG tablet Take 2 tablets (40 mg total) by mouth daily. 90 tablet 0  . Iron-Vitamins (GERITOL PO) Take 1 tablet by mouth daily.    . Lancets (ONETOUCH DELICA PLUS [id number]) MISC 1 each by Other route 3 (three) times daily. Use to check blood sugar 3 times a day 300 each 11  . linaclotide (LINZESS) 290 MCG CAPS capsule Take 290 mcg by mouth daily before breakfast.    . LORazepam (ATIVAN) 0.5 MG tablet Take 0.5 mg by mouth 3 (three) times daily.    [name] losartan-hydrochlorothiazide (HYZAAR) 100-12.5 MG tablet Take 1 tablet by mouth daily.    . methocarbamol (ROBAXIN) 500 MG tablet Take 1 tablet (500 mg total) by mouth every 8 (eight) hours as needed for muscle spasms. 30 tablet 0  . Multiple Vitamin (MULTIVITAMIN WITH MINERALS) TABS tablet Take 1 tablet by mouth daily.    Glory Rosebush VERIO test strip TEST BLOOD SUGAR THREE TIMES DAILY 100 strip 11  . oxyCODONE (ROXICODONE) 15 MG immediate release tablet Take 15 mg by mouth every 6 (six) hours as needed for pain.     . pregabalin (LYRICA) 300 MG capsule Take 300 mg by mouth 2 (two) times daily.    . rosuvastatin (CRESTOR) 20 MG tablet Take 20 mg by mouth daily.    . SURE COMFORT PEN NEEDLES 32G X 6 MM MISC test FOUR TIMES DAILY 200 each 1  . tiZANidine (ZANAFLEX) 4 MG tablet Take 4 mg by mouth 2 (two) times daily.     . valACYclovir (VALTREX) 1000 MG tablet Take 1,000 mg by mouth 2 (two) times daily.     [name] venlafaxine XR (EFFEXOR-XR) 150 MG 24 hr capsule Take 150 mg by mouth 2 (two) times daily.    .  ziprasidone (GEODON) 60 MG capsule Take 60 mg by mouth daily.     No current facility-administered medications on file prior to visit.    Allergies  Allergen Reactions  . Bee Venom Anaphylaxis  . Invokana [Canagliflozin] Anaphylaxis    Dyspnea and urinary retention  . Shellfish Allergy Anaphylaxis  . Buprenorphine Hcl Other (See Comments)    Overly sedated with morphine drip.  . Other Other (See Comments)    Cats - bad asthma, stopped up    Family History  Problem Relation Age of Onset  . Heart disease Brother        Multiple MIs, starting in his 10s  . Diabetes Brother   . Thyroid disease Brother   . Hypertension Mother   . Diabetes Mother   . Breast cancer Mother 52  . Bone cancer Mother   . Hypertension Sister   . Diabetes Sister   . Breast cancer Sister 40  . Thyroid disease Sister   . Breast cancer Maternal Grandmother   . Heart disease Maternal Grandmother   . Uterine cancer Other 19  . Breast cancer Paternal Aunt 43  . Breast cancer Paternal Grandmother        dx in her 68s  . Prostate cancer Paternal Grandfather   . Asthma Son     BP 118/74 (BP Location: Right Arm, Patient Position: Sitting, Cuff Size: Large)   Pulse 96   Ht _0  (1.651 m)   SpO2 97%   BMI 62.40 kg/m    Review of Systems She denies hypoglycemia    Objective:   Physical Exam VITAL SIGNS:  See vs page GENERAL: no distress.  In [facility] NECK: There is no palpable thyroid enlargement.  No thyroid nodule is palpable.  No palpable lymphadenopathy at the anterior neck.   Lab Results  Component Value Date   CREATININE 1.47 (H) 06/15/2019   BUN 14 06/15/2019   NA 138 06/15/2019   K 4.5 06/15/2019   CL 102 06/15/2019   CO2 25 06/15/2019    A1c=8.1%     Assessment & Plan:  Insulin-requiring type 2 DM, with stage 3 CRI: uncontrolled.    Patient Instructions  Please continue the same Trulicity, and:  Increase the NPH insulin to 28 units units each morning.  check your blood sugar  twice a day.  vary the time of day when you check, between before the 3 meals, and at bedtime.  also check if you have symptoms of your blood sugar being too high or too low.  please keep a record of the readings and bring it to your next appointment here (or you can bring the meter itself).  You can write it on any piece of paper.  please call us sooner if your blood sugar goes below 70, or if you have a lot of readings over 200.   Please reduce the vitamin-D to 1 pill of 2000 units per day.  Please come back for a follow-up appointment in 2 months.

## 2020-05-09 LAB — PTH, INTACT AND CALCIUM
Calcium: 9.8 mg/dL (ref 8.6–10.4)
PTH: 38 pg/mL (ref 16–77)

## 2020-05-09 MED ORDER — LEVOTHYROXINE SODIUM 200 MCG PO TABS: 200.0000 ug | ORAL_TABLET | Freq: Every day | ORAL | 3 refills | Status: DC

## 2020-05-16 ENCOUNTER — Other Ambulatory Visit: Payer: Self-pay | Admitting: Endocrinology

## 2020-05-16 DIAGNOSIS — N2581 Secondary hyperparathyroidism of renal origin: Secondary | ICD-10-CM

## 2020-05-25 ENCOUNTER — Telehealth: Payer: Self-pay | Admitting: Endocrinology

## 2020-05-25 NOTE — Telephone Encounter (Signed)
New message    Patient called BS [phone].   Please advise on next steps patient voiced BS has never been this high before

## 2020-05-26 NOTE — Telephone Encounter (Signed)
Please verify no n/v/sob Than take 5 extra units of NPH insulin now, then increase insulin to 36 units qam. Please call or message [place] next week, to tell us how the blood sugar is doing

## 2020-05-26 NOTE — Telephone Encounter (Signed)
Please Advise

## 2020-05-26 NOTE — Telephone Encounter (Signed)
Just spoke with pt and she stated that her BS is @ 330 this minute and I gave her your orders to increase insulin. I told her that I would check on her tomorrow.

## 2020-07-10 ENCOUNTER — Ambulatory Visit (INDEPENDENT_AMBULATORY_CARE_PROVIDER_SITE_OTHER): Payer: Medicare Other | Admitting: Endocrinology

## 2020-07-10 ENCOUNTER — Other Ambulatory Visit: Payer: Self-pay

## 2020-07-10 VITALS — BP 138/84 | HR 106 | Ht 65.0 in

## 2020-07-10 DIAGNOSIS — E890 Postprocedural hypothyroidism: Secondary | ICD-10-CM

## 2020-07-10 DIAGNOSIS — E118 Type 2 diabetes mellitus with unspecified complications: Secondary | ICD-10-CM | POA: Diagnosis not present

## 2020-07-10 DIAGNOSIS — Z794 Long term (current) use of insulin: Secondary | ICD-10-CM | POA: Diagnosis not present

## 2020-07-10 LAB — POCT GLYCOSYLATED HEMOGLOBIN (HGB A1C): Hemoglobin A1C: 9 % — AB (ref 4.0–5.6)

## 2020-07-10 LAB — TSH: TSH: 4.85 u[IU]/mL — ABNORMAL HIGH (ref 0.35–4.50)

## 2020-07-10 MED ORDER — LEVOTHYROXINE SODIUM 112 MCG PO TABS: 224.0000 ug | ORAL_TABLET | Freq: Every day | ORAL | 3 refills | Status: DC

## 2020-07-10 MED ORDER — HUMULIN N KWIKPEN 100 UNIT/ML ~~LOC~~ SUPN: 34.0000 [IU] | PEN_INJECTOR | SUBCUTANEOUS | 3 refills | Status: DC

## 2020-07-10 NOTE — Progress Notes (Signed)
Subjective:    Patient ID: [name], female    DOB: [birth year], 65 y.o.   MRN: [id number]  HPI Pt returns for f/u of postsurgical hypothyroidism (she had thyroidectomy in approx 1988, for a goiter; she says no cancer was found; she has been on prescribed thyroid hormone therapy since then; she has not recently had thyroid imaging; she does not know why TSH fluctuates, but med noncompliance has been noted; she takes synthroid as rx'ed; MRI (2018) showed normal orbits, and no mention was made of the pituitary).  Since synthroid was increased, she still has fatigue.   Pt also returns for f/u of diabetes mellitus:  DM type: Insulin-requiring type 2.   Dx'ed: 2015.  Complications: stage 3 CRI, PN, and TIA.  Therapy: insulin since 2500, and Trulicity.    GDM: never.  DKA: never Severe hypoglycemia: never.   Pancreatitis: never Pancreatic imaging: Fatty atrophy (2018 CT).   Other: she takes multiple daily injections; she did not tolerate Invokana (sob); She declined to continue multiple daily injections.  Interval history: Meter is downloaded today, and the printout is scanned into the record.  cbg varies from 106-182.  There is no trend throughout the day.  She takes NPH, 33 units qam, and other DM meds as rx'ed She takes non-rx Vit-D, at an uncertain dosage.     Past Medical History:  Diagnosis Date  . Anemia   . Anxiety   . Arthritis    "pretty much all my joints"  . Asthma   . Benign paroxysmal positional vertigo 12/30/2012  . Breast cancer ([place]) 02/13/12   ruq  100'clock bx Ductal Carcinoma in Situ,(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  . Breast mass in female    L breast 2008, US showed likely fat necrosis  . Chronic lower back pain   . CKD (chronic kidney disease), stage III ([place])    "lower stage" (01/06/2014)  . Daily headache    last time was 2016/08/28 (brother passed away)  . Depression   . Dyspnea   . Gait abnormality 05/29/2016  . GERD (gastroesophageal reflux disease)   . History  of blood transfusion 2011   "after one of my OR's"  . History of hiatal hernia   . History of stomach ulcers   . HSV (herpes simplex virus) infection   . Hx of radiation therapy 05/05/12- 07/15/12   right breast, 45 gray x 25 fx, lumpectomy cavity boosted to 16.2 gray  . Hyperlipemia   . Hypertension    sees Dr. Criss Rosales , [facility] [place]  . Hypothyroidism   . Kidney stones   . Knee pain, bilateral   . Memory difficulty 12/22/2017  . Migraines    "~ 3 times/month" (01/06/2014)  . Obesity   . OSA on CPAP    pt does not know settings  . Pancreatitis    hx of  . Personal history of radiation therapy 02/2012  . Pneumonia 1950's  . Polyneuropathy in diabetes(357.2)   . Schizophrenia ([place])   . 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 Tachycardia    with sx in 2008  . Thyroid disease   . Type II diabetes mellitus ([place])    dx 2014    Past Surgical History:  Procedure Laterality Date  . ABDOMINAL HYSTERECTOMY  1979?   partial  . benign cyst  Left    removed from left breast  . BREAST BIOPSY Right 02/13/2012  . BREAST LUMPECTOMY  03/03/2012   Procedure: LUMPECTOMY;  Surgeon: Autumn Patty  Streck, MD;  Location: [facility];  Service: General;  Laterality: Right;  . BREAST SURGERY Right 01/2012   "cancer"  . CHOLECYSTECTOMY  1980's?  . COLONOSCOPY WITH PROPOFOL N/A 09/28/2012   Procedure: COLONOSCOPY WITH PROPOFOL;  Surgeon: Lear Ng, MD;  Location: [facility];  Service: Endoscopy;  Laterality: N/A;  . FOOT FRACTURE SURGERY Right 1990's  . JOINT REPLACEMENT      x 3  . LUMBAR LAMINECTOMY/DECOMPRESSION MICRODISCECTOMY N/A 12/05/2016   Procedure: LEFT L5-S1 MICRODISCECTOMY;  Surgeon: Marybelle Killings, MD;  Location: [facility];  Service: Orthopedics;  Laterality: N/A;  . REVISION TOTAL KNEE ARTHROPLASTY  07/2009  . SHOULDER OPEN ROTATOR CUFF REPAIR Left 1990's  . THYROIDECTOMY  1970's  . TOTAL KNEE ARTHROPLASTY Bilateral 2009-06/2009   left; right    Social History   Socioeconomic  History  . Marital status: Divorced    Spouse name: Not on file  . Number of children: 2  . Years of education: 52  . Highest education level: Not on file  Occupational History  . Occupation: Disabled  Tobacco Use  . Smoking status: Former Smoker    Packs/day: 1.00    Years: 7.00    Pack years: 7.00    Types: Cigarettes    Quit date: 03/24/1992    Years since quitting: 28.3  . Smokeless tobacco: Never Used  Vaping Use  . Vaping Use: Never used  Substance and Sexual Activity  . 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 Drug use: No  . Sexual activity: Never    Birth control/protection: Surgical  Other Topics Concern  . Not on file  Social History Narrative   Single. Disabled Biochemist, clinical.          Patient is right handed.    Denies caffeine use    Social Determinants of Radio broadcast assistant Strain: Not on file  Food Insecurity: Not on file  Transportation Needs: Not on file  Physical Activity: Not on file  Stress: Not on file  Social Connections: Not on file  Intimate Partner Violence: Not on file    Current Outpatient Medications on File Prior to Visit  Medication Sig Dispense Refill  . albuterol (VENTOLIN HFA) 108 (90 Base) MCG/ACT inhaler Inhale 2 puffs into the lungs every 4 (four) hours as needed for wheezing or shortness of breath.     [name] aspirin EC 81 MG tablet Take 1 tablet (81 mg total) by mouth daily. 30 tablet 0  . B Complex-C (B-COMPLEX WITH VITAMIN C) tablet Take 1 tablet by mouth daily.    . budesonide-formoterol (SYMBICORT) 80-4.5 MCG/ACT inhaler Inhale 2 puffs into the lungs 2 (two) times daily. (Patient taking differently: Inhale 1 puff into the lungs 3 (three) times daily. PRN) 1 Inhaler 11  . calcitRIOL (ROCALTROL) 0.25 MCG capsule Take 1 capsule (0.25 mcg total) by mouth 3 (three) times a week. 12 capsule 2  . Cholecalciferol (VITAMIN D-3) 1000 units CAPS Take 1,000 Units by mouth daily.    .  Cyanocobalamin (VITAMIN B-12 PO) Take 1 tablet by mouth daily.    . diphenhydrAMINE (BENADRYL) 25 MG tablet Take 50 mg by mouth every 6 (six) hours as needed for itching or allergies.    [name] doxepin (SINEQUAN) 25 MG capsule Take 50 mg by mouth at bedtime.    . Dulaglutide (TRULICITY) 4.5 TM/5.4YT SOPN Inject 4.5 mg into the skin once a week. 6 mL 3  .  esomeprazole (NEXIUM) 40 MG capsule Take 1 capsule (40 mg total) by mouth daily before breakfast. (Patient taking differently: Take 40 mg by mouth daily at 12 noon.) 30 capsule 1  . furosemide (LASIX) 20 MG tablet Take 2 tablets (40 mg total) by mouth daily. 90 tablet 0  . Iron-Vitamins (GERITOL PO) Take 1 tablet by mouth daily.    . Lancets (ONETOUCH DELICA PLUS [id number]) MISC 1 each by Other route 3 (three) times daily. Use to check blood sugar 3 times a day 300 each 11  . levothyroxine (SYNTHROID) 200 MCG tablet Take 1 tablet (200 mcg total) by mouth daily before breakfast. 90 tablet 3  . linaclotide (LINZESS) 290 MCG CAPS capsule Take 290 mcg by mouth daily before breakfast.    . LORazepam (ATIVAN) 0.5 MG tablet Take 0.5 mg by mouth 3 (three) times daily.    [name] losartan-hydrochlorothiazide (HYZAAR) 100-12.5 MG tablet Take 1 tablet by mouth daily.    . methocarbamol (ROBAXIN) 500 MG tablet Take 1 tablet (500 mg total) by mouth every 8 (eight) hours as needed for muscle spasms. 30 tablet 0  . Multiple Vitamin (MULTIVITAMIN WITH MINERALS) TABS tablet Take 1 tablet by mouth daily.    Glory Rosebush VERIO test strip TEST BLOOD SUGAR THREE TIMES DAILY 100 strip 11  . oxyCODONE (ROXICODONE) 15 MG immediate release tablet Take 15 mg by mouth every 6 (six) hours as needed for pain.     . pregabalin (LYRICA) 300 MG capsule Take 300 mg by mouth 2 (two) times daily.    . rosuvastatin (CRESTOR) 20 MG tablet Take 20 mg by mouth daily.    . SURE COMFORT PEN NEEDLES 32G X 6 MM MISC test FOUR TIMES DAILY 200 each 1  . tiZANidine (ZANAFLEX) 4 MG tablet Take 4 mg by  mouth 2 (two) times daily.     . valACYclovir (VALTREX) 1000 MG tablet Take 1,000 mg by mouth 2 (two) times daily.     [name] venlafaxine XR (EFFEXOR-XR) 150 MG 24 hr capsule Take 150 mg by mouth 2 (two) times daily.    . ziprasidone (GEODON) 60 MG capsule Take 60 mg by mouth daily.     No current facility-administered medications on file prior to visit.    Allergies  Allergen Reactions  . Bee Venom Anaphylaxis  . Invokana [Canagliflozin] Anaphylaxis    Dyspnea and urinary retention  . Shellfish Allergy Anaphylaxis  . Buprenorphine Hcl Other (See Comments)    Overly sedated with morphine drip.  . Other Other (See Comments)    Cats - bad asthma, stopped up    Family History  Problem Relation Age of Onset  . Heart disease Brother        Multiple MIs, starting in his 79s  . Diabetes Brother   . Thyroid disease Brother   . Hypertension Mother   . Diabetes Mother   . Breast cancer Mother 21  . Bone cancer Mother   . Hypertension Sister   . Diabetes Sister   . Breast cancer Sister 25  . Thyroid disease Sister   . Breast cancer Maternal Grandmother   . Heart disease Maternal Grandmother   . Uterine cancer Other 19  . Breast cancer Paternal Aunt 54  . Breast cancer Paternal Grandmother        dx in her 97s  . Prostate cancer Paternal Grandfather   . Asthma Son     BP 138/84 (BP Location: Right Arm, Patient Position: Sitting, Cuff Size: Large)  Pulse (!) 106   Ht 5' 5" (1.651 m)   SpO2 93%   BMI 62.40 kg/m    Review of Systems     Objective:   Physical Exam VITAL SIGNS:  See vs page GENERAL: no distress Pulses: dorsalis pedis intact bilat.   MSK: no deformity of the feet CV: 2+ bilat leg edema Skin:  no ulcer on the feet.  normal color and temp on the feet. Neuro: sensation is intact to touch on the feet, but decreased from normal.     Lab Results  Component Value Date   HGBA1C 9.0 (A) 07/10/2020   Lab Results  Component Value Date   TSH 4.85 (H) 07/10/2020       Assessment & Plan:  Insulin-requiring type 2 DM: A1c is much higher than would be suggested by cbg's.  Check fructosamine.  Hypothyroidism: uncontrolled. Increase synthroid to 2x112 mcg/d.     Patient Instructions  Please continue the same Trulicity, and:  Increase the NPH insulin to 34 units units each morning.   Blood tests are requested for you today.  We'll let you know about the results.  check your blood sugar twice a day.  vary the time of day when you check, between before the 3 meals, and at bedtime.  also check if you have symptoms of your blood sugar being too high or too low.  please keep a record of the readings and bring it to your next appointment here (or you can bring the meter itself).  You can write it on any piece of paper.  please call us sooner if your blood sugar goes below 70, or if you have a lot of readings over 200.   Please come back for a follow-up appointment in 2 months.

## 2020-07-10 NOTE — Patient Instructions (Addendum)
Please continue the same Trulicity, and:  Increase the NPH insulin to 34 units units each morning.   Blood tests are requested for you today.  We'll let you know about the results.  check your blood sugar twice a day.  vary the time of day when you check, between before the 3 meals, and at bedtime.  also check if you have symptoms of your blood sugar being too high or too low.  please keep a record of the readings and bring it to your next appointment here (or you can bring the meter itself).  You can write it on any piece of paper.  please call us sooner if your blood sugar goes below 70, or if you have a lot of readings over 200.   Please come back for a follow-up appointment in 2 months.

## 2020-07-13 LAB — FRUCTOSAMINE: Fructosamine: 298 umol/L — ABNORMAL HIGH (ref 205–285)

## 2020-08-01 ENCOUNTER — Other Ambulatory Visit: Payer: Self-pay | Admitting: Endocrinology

## 2020-08-01 DIAGNOSIS — N2581 Secondary hyperparathyroidism of renal origin: Secondary | ICD-10-CM

## 2020-08-27 ENCOUNTER — Other Ambulatory Visit: Payer: Self-pay | Admitting: Physician Assistant

## 2020-08-27 DIAGNOSIS — N6310 Unspecified lump in the right breast, unspecified quadrant: Secondary | ICD-10-CM

## 2020-09-03 IMAGING — MR MR ANKLE*R* W/O CM
5 series · 38 of 40 positions shown · non-contrast
Comparison: Plain films right foot 01/12/2019.

CLINICAL DATA: Right ankle pain. The patient reports a history of
prior surgery after trauma. No recent injury.

EXAM:
MRI OF THE RIGHT ANKLE WITHOUT CONTRAST
TECHNIQUE: Multiplanar, multisequence MR imaging of the ankle was performed. No
intravenous contrast was administered.

[Series 3: T2 fat-sat · axial · 3.0mm · 0.50mm/px · z∈[-154,+9]mm · 11 of 44 slices shown (1 of 2)]
[im 1/44]
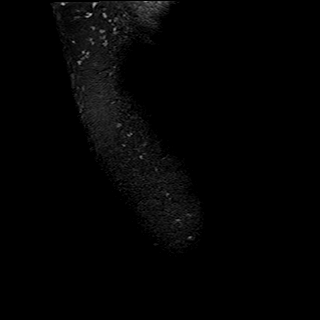
[im 5/44]
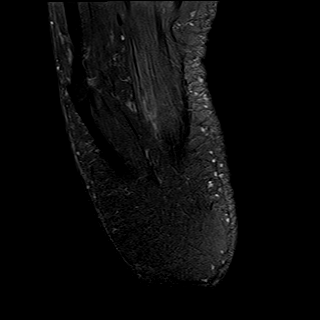
[im 9/44]
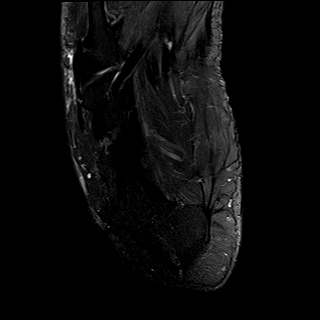
[im 13/44]
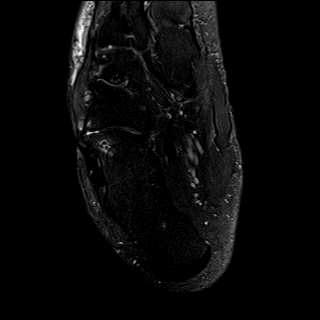
[im 18/44]
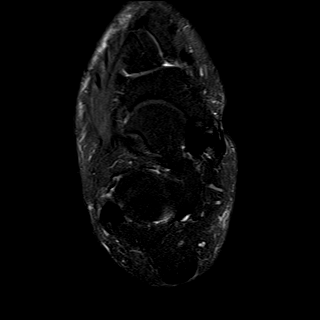
[im 22/44]
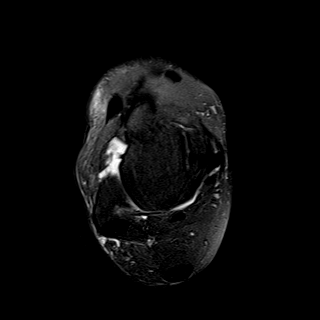
[im 26/44]
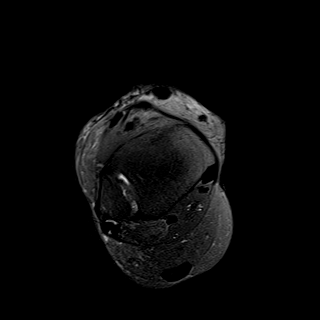
[im 31/44]
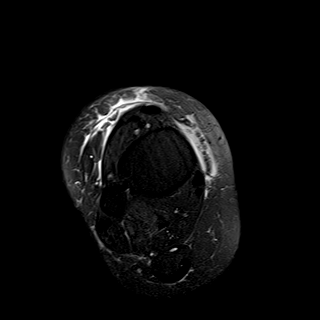
[im 35/44]
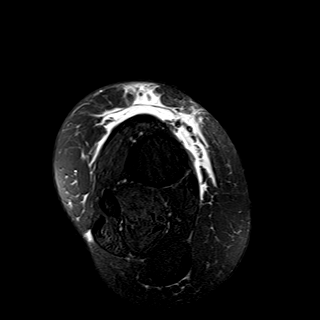
[im 39/44]
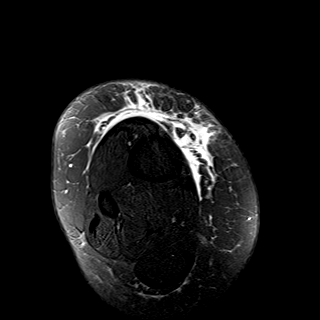
[im 44/44]
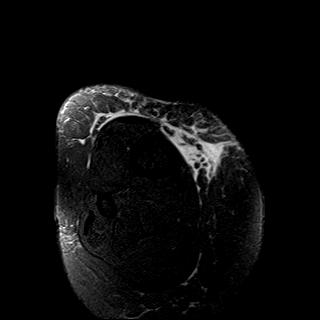

[Series 4: PD fat-sat · axial · 3.0mm · 0.42mm/px · z∈[-154,+9]mm · 11 of 44 slices shown]
[im 1/44]
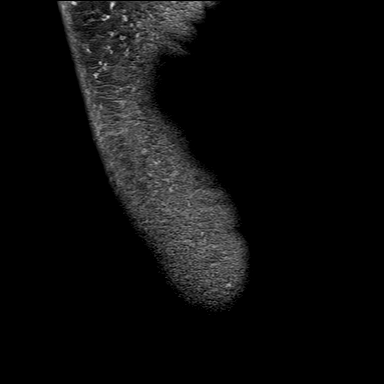
[im 5/44]
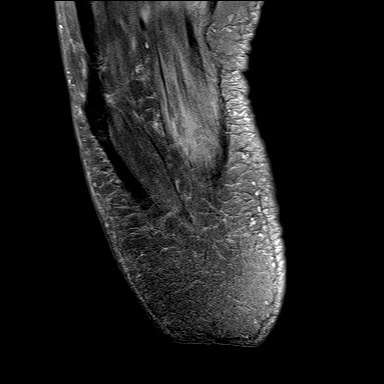
[im 9/44]
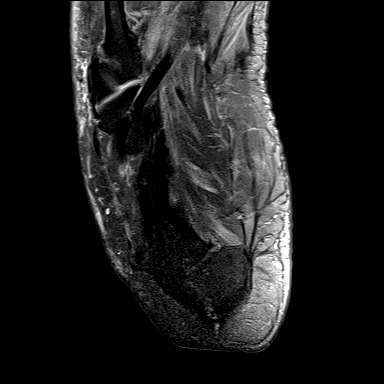
[im 13/44]
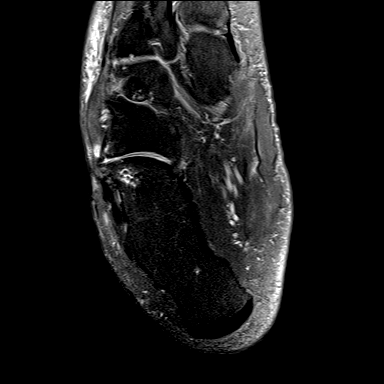
[im 18/44]
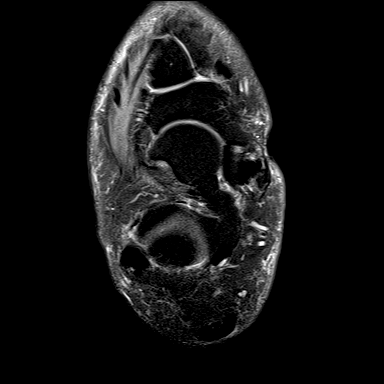
[im 22/44]
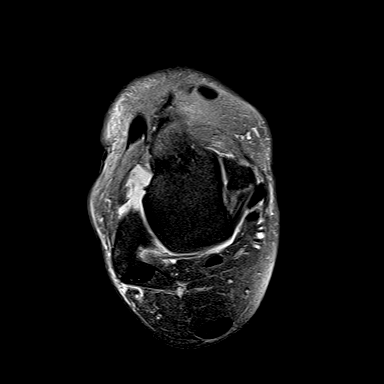
[im 26/44]
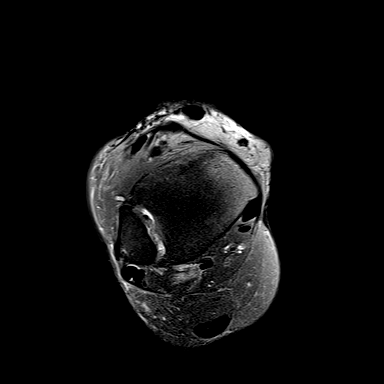
[im 31/44]
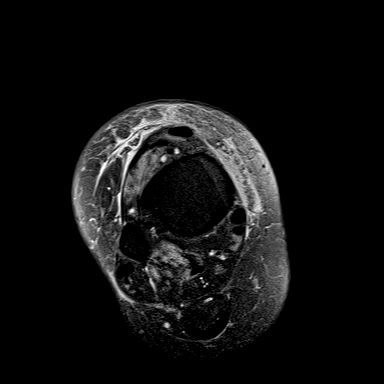
[im 35/44]
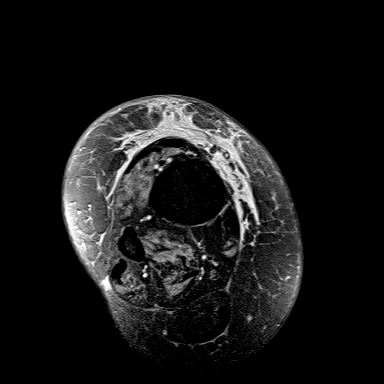
[im 39/44]
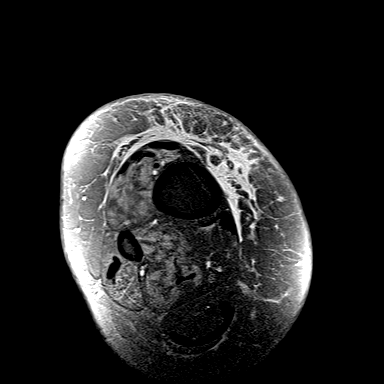
[im 44/44]
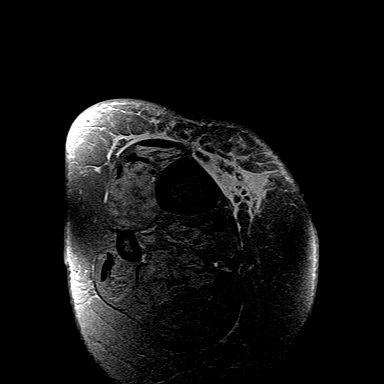

[Series 5: T1 · sagittal · 4.0mm · 0.59mm/px · 5 of 22 slices shown]
[im 1/22]
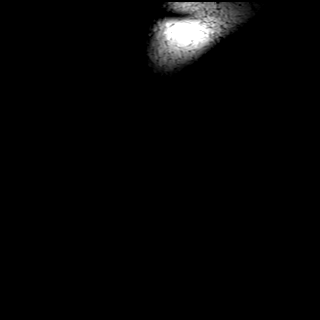
[im 6/22]
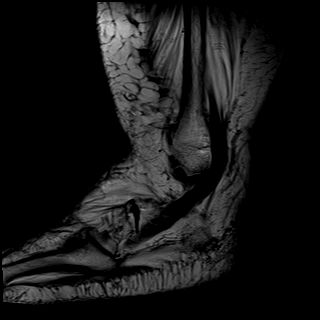
[im 11/22]
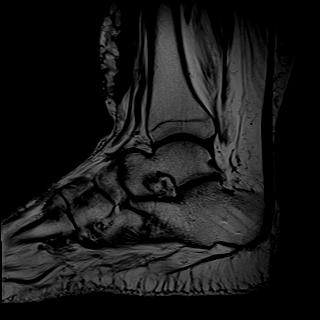
[im 16/22]
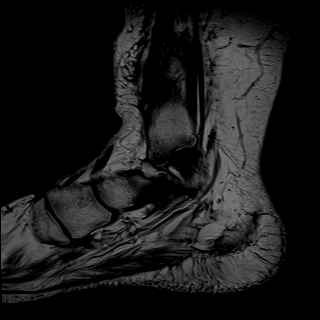
[im 22/22]
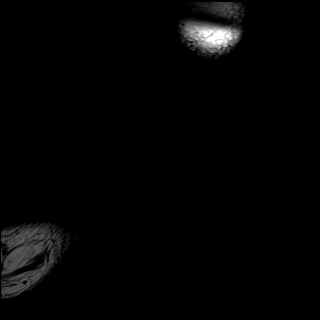

[Series 6: STIR · sagittal · 4.0mm · 0.35mm/px · 3 of 22 slices shown]
[im 1/22]
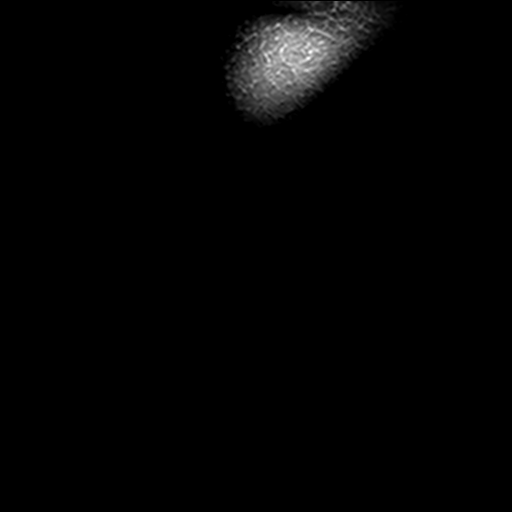
[im 6/22]
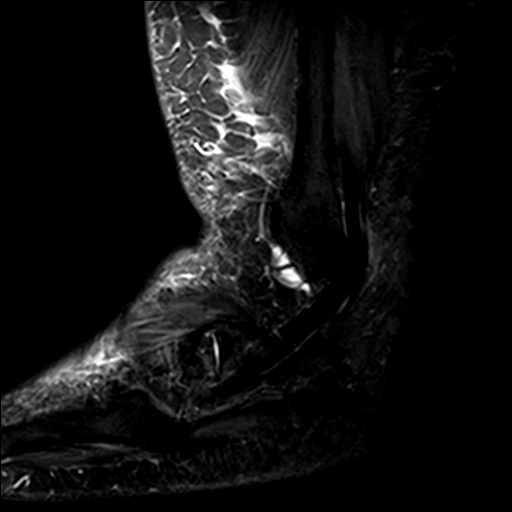
[im 11/22]
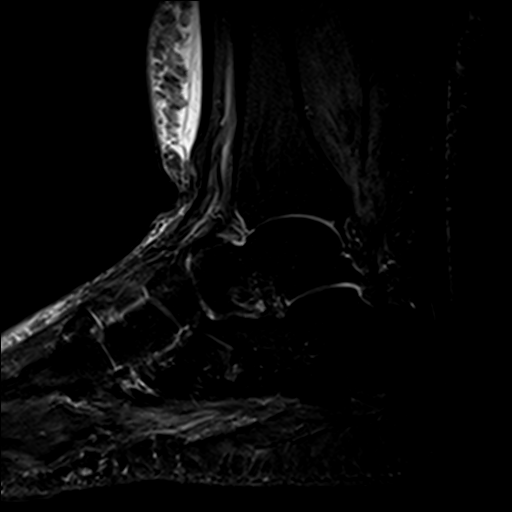

[Series 7: T2 fat-sat · coronal · 3.0mm · 0.59mm/px · 8 of 35 slices shown (2 of 2)]
[im 1/35]
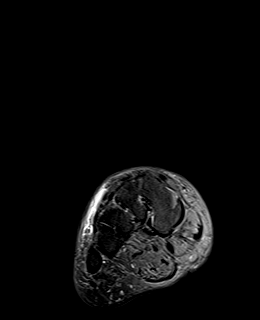
[im 5/35]
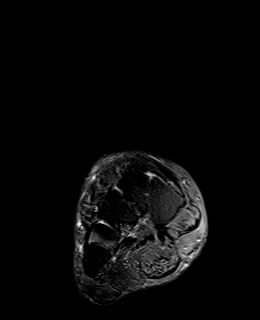
[im 10/35]
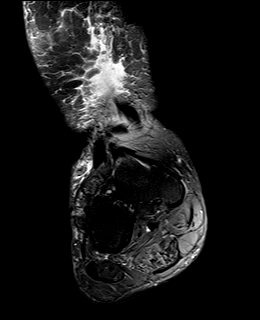
[im 15/35]
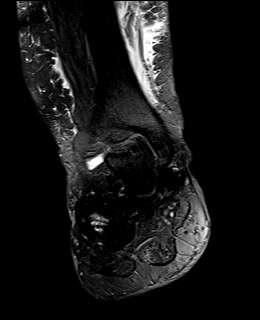
[im 20/35]
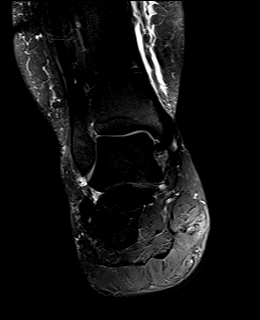
[im 25/35]
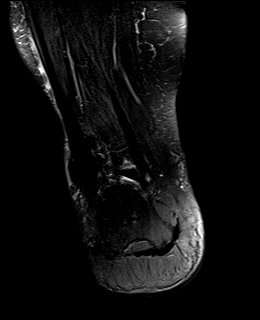
[im 30/35]
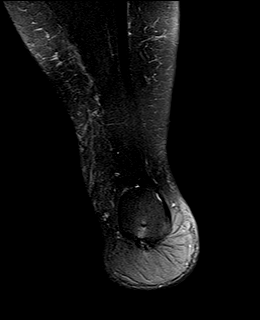
[im 35/35]
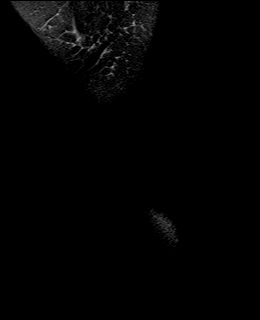

[38 of 40 positions shown; findings below may reference images not displayed]

FINDINGS: TENDONS

Peroneal: Intact.

Posteromedial: Intact.

Anterior: Intact.

Achilles: Intact.

Plantar Fascia: Intact. No evidence of plantar fasciitis.

LIGAMENTS

Lateral: The anterior talofibular ligament is completely torn. The
tear appears chronic. Lateral ligaments are otherwise intact.

Medial: Intact.

CARTILAGE

Ankle Joint: Normal. No osteochondral lesion of the talar dome. No
joint effusion.

Subtalar Joints/Sinus Tarsi: Normal.

Bones: No fracture, stress change or worrisome lesion is identified.
There is some artifact along lateral margin of the distal fibula and
adjacent to the calcaneocuboid joint compatible with prior surgery.
The patient has mild to moderate calcaneocuboid osteoarthritis.

Other: There is diffuse fatty atrophy of all imaged musculature of
the lower leg and foot. Mild subcutaneous edema about the ankle is
likely due to dependent change.
IMPRESSION: 1. No acute abnormality or finding to explain the patient's
symptoms.
2. Chronic, complete ATFL tear.
3. Mild to moderate calcaneocuboid osteoarthritis.
4. fatty atrophy of all imaged musculature of the lower leg and foot
is likely due to disuse.

## 2020-09-06 ENCOUNTER — Other Ambulatory Visit: Payer: Self-pay | Admitting: Endocrinology

## 2020-09-24 ENCOUNTER — Other Ambulatory Visit: Payer: Self-pay | Admitting: Endocrinology

## 2020-09-29 ENCOUNTER — Other Ambulatory Visit: Payer: Self-pay

## 2020-09-29 ENCOUNTER — Emergency Department (HOSPITAL_COMMUNITY): Payer: Medicare Other

## 2020-09-29 ENCOUNTER — Inpatient Hospital Stay (HOSPITAL_COMMUNITY): Payer: Medicare Other

## 2020-09-29 ENCOUNTER — Encounter (HOSPITAL_COMMUNITY): Payer: Self-pay | Admitting: Emergency Medicine

## 2020-09-29 ENCOUNTER — Inpatient Hospital Stay (HOSPITAL_COMMUNITY)
Admission: EM | Admit: 2020-09-29 | Discharge: 2020-10-06 | DRG: 917 | Disposition: A | Discharge status: Skilled Nursing Facility | Payer: Medicare Other | Attending: Family Medicine | Admitting: Family Medicine

## 2020-09-29 DIAGNOSIS — I872 Venous insufficiency (chronic) (peripheral): Secondary | ICD-10-CM | POA: Diagnosis present

## 2020-09-29 DIAGNOSIS — M545 Low back pain, unspecified: Secondary | ICD-10-CM | POA: Diagnosis present

## 2020-09-29 DIAGNOSIS — E1122 Type 2 diabetes mellitus with diabetic chronic kidney disease: Secondary | ICD-10-CM | POA: Diagnosis present

## 2020-09-29 DIAGNOSIS — I10 Essential (primary) hypertension: Secondary | ICD-10-CM

## 2020-09-29 DIAGNOSIS — N179 Acute kidney failure, unspecified: Secondary | ICD-10-CM | POA: Diagnosis present

## 2020-09-29 DIAGNOSIS — Z888 Allergy status to other drugs, medicaments and biological substances status: Secondary | ICD-10-CM

## 2020-09-29 DIAGNOSIS — G894 Chronic pain syndrome: Secondary | ICD-10-CM | POA: Diagnosis present

## 2020-09-29 DIAGNOSIS — G928 Other toxic encephalopathy: Secondary | ICD-10-CM | POA: Diagnosis present

## 2020-09-29 DIAGNOSIS — Z6841 Body Mass Index (BMI) 40.0 and over, adult: Secondary | ICD-10-CM | POA: Diagnosis not present

## 2020-09-29 DIAGNOSIS — N1832 Chronic kidney disease, stage 3b: Secondary | ICD-10-CM | POA: Diagnosis present

## 2020-09-29 DIAGNOSIS — E876 Hypokalemia: Secondary | ICD-10-CM | POA: Diagnosis present

## 2020-09-29 DIAGNOSIS — M25512 Pain in left shoulder: Secondary | ICD-10-CM | POA: Diagnosis present

## 2020-09-29 DIAGNOSIS — K219 Gastro-esophageal reflux disease without esophagitis: Secondary | ICD-10-CM | POA: Diagnosis present

## 2020-09-29 DIAGNOSIS — K449 Diaphragmatic hernia without obstruction or gangrene: Secondary | ICD-10-CM | POA: Diagnosis present

## 2020-09-29 DIAGNOSIS — Z794 Long term (current) use of insulin: Secondary | ICD-10-CM | POA: Diagnosis not present

## 2020-09-29 DIAGNOSIS — J449 Chronic obstructive pulmonary disease, unspecified: Secondary | ICD-10-CM | POA: Diagnosis present

## 2020-09-29 DIAGNOSIS — F200 Paranoid schizophrenia: Secondary | ICD-10-CM | POA: Diagnosis present

## 2020-09-29 DIAGNOSIS — Z853 Personal history of malignant neoplasm of breast: Secondary | ICD-10-CM | POA: Diagnosis not present

## 2020-09-29 DIAGNOSIS — Z452 Encounter for adjustment and management of vascular access device: Secondary | ICD-10-CM

## 2020-09-29 DIAGNOSIS — Y92009 Unspecified place in unspecified non-institutional (private) residence as the place of occurrence of the external cause: Secondary | ICD-10-CM | POA: Diagnosis not present

## 2020-09-29 DIAGNOSIS — E871 Hypo-osmolality and hyponatremia: Secondary | ICD-10-CM | POA: Diagnosis present

## 2020-09-29 DIAGNOSIS — Z8349 Family history of other endocrine, nutritional and metabolic diseases: Secondary | ICD-10-CM

## 2020-09-29 DIAGNOSIS — D649 Anemia, unspecified: Secondary | ICD-10-CM | POA: Diagnosis present

## 2020-09-29 DIAGNOSIS — J9602 Acute respiratory failure with hypercapnia: Secondary | ICD-10-CM | POA: Diagnosis present

## 2020-09-29 DIAGNOSIS — J9622 Acute and chronic respiratory failure with hypercapnia: Secondary | ICD-10-CM

## 2020-09-29 DIAGNOSIS — Z803 Family history of malignant neoplasm of breast: Secondary | ICD-10-CM

## 2020-09-29 DIAGNOSIS — B009 Herpesviral infection, unspecified: Secondary | ICD-10-CM | POA: Diagnosis present

## 2020-09-29 DIAGNOSIS — N1831 Chronic kidney disease, stage 3a: Secondary | ICD-10-CM | POA: Diagnosis not present

## 2020-09-29 DIAGNOSIS — J9621 Acute and chronic respiratory failure with hypoxia: Secondary | ICD-10-CM

## 2020-09-29 DIAGNOSIS — F32A Depression, unspecified: Secondary | ICD-10-CM | POA: Diagnosis present

## 2020-09-29 DIAGNOSIS — E890 Postprocedural hypothyroidism: Secondary | ICD-10-CM | POA: Diagnosis present

## 2020-09-29 DIAGNOSIS — E662 Morbid (severe) obesity with alveolar hypoventilation: Secondary | ICD-10-CM | POA: Diagnosis present

## 2020-09-29 DIAGNOSIS — T40601A Poisoning by unspecified narcotics, accidental (unintentional), initial encounter: Secondary | ICD-10-CM | POA: Diagnosis not present

## 2020-09-29 DIAGNOSIS — T402X1A Poisoning by other opioids, accidental (unintentional), initial encounter: Secondary | ICD-10-CM | POA: Diagnosis present

## 2020-09-29 DIAGNOSIS — Z923 Personal history of irradiation: Secondary | ICD-10-CM

## 2020-09-29 DIAGNOSIS — Z8711 Personal history of peptic ulcer disease: Secondary | ICD-10-CM | POA: Diagnosis not present

## 2020-09-29 DIAGNOSIS — R52 Pain, unspecified: Secondary | ICD-10-CM

## 2020-09-29 DIAGNOSIS — I129 Hypertensive chronic kidney disease with stage 1 through stage 4 chronic kidney disease, or unspecified chronic kidney disease: Secondary | ICD-10-CM | POA: Diagnosis present

## 2020-09-29 DIAGNOSIS — Z79890 Hormone replacement therapy (postmenopausal): Secondary | ICD-10-CM

## 2020-09-29 DIAGNOSIS — R4182 Altered mental status, unspecified: Secondary | ICD-10-CM | POA: Diagnosis not present

## 2020-09-29 DIAGNOSIS — G219 Secondary parkinsonism, unspecified: Secondary | ICD-10-CM | POA: Diagnosis present

## 2020-09-29 DIAGNOSIS — Z20822 Contact with and (suspected) exposure to covid-19: Secondary | ICD-10-CM | POA: Diagnosis present

## 2020-09-29 DIAGNOSIS — G9341 Metabolic encephalopathy: Secondary | ICD-10-CM

## 2020-09-29 DIAGNOSIS — Z96653 Presence of artificial knee joint, bilateral: Secondary | ICD-10-CM | POA: Diagnosis present

## 2020-09-29 DIAGNOSIS — Z825 Family history of asthma and other chronic lower respiratory diseases: Secondary | ICD-10-CM

## 2020-09-29 DIAGNOSIS — Z7984 Long term (current) use of oral hypoglycemic drugs: Secondary | ICD-10-CM

## 2020-09-29 DIAGNOSIS — E8770 Fluid overload, unspecified: Secondary | ICD-10-CM | POA: Diagnosis present

## 2020-09-29 DIAGNOSIS — Z7982 Long term (current) use of aspirin: Secondary | ICD-10-CM

## 2020-09-29 DIAGNOSIS — Z87891 Personal history of nicotine dependence: Secondary | ICD-10-CM

## 2020-09-29 DIAGNOSIS — G9340 Encephalopathy, unspecified: Secondary | ICD-10-CM | POA: Diagnosis not present

## 2020-09-29 DIAGNOSIS — Z79899 Other long term (current) drug therapy: Secondary | ICD-10-CM

## 2020-09-29 DIAGNOSIS — Z91013 Allergy to seafood: Secondary | ICD-10-CM

## 2020-09-29 DIAGNOSIS — E1165 Type 2 diabetes mellitus with hyperglycemia: Secondary | ICD-10-CM | POA: Diagnosis present

## 2020-09-29 DIAGNOSIS — R400 Somnolence: Secondary | ICD-10-CM

## 2020-09-29 DIAGNOSIS — Z8249 Family history of ischemic heart disease and other diseases of the circulatory system: Secondary | ICD-10-CM

## 2020-09-29 DIAGNOSIS — Z87442 Personal history of urinary calculi: Secondary | ICD-10-CM

## 2020-09-29 DIAGNOSIS — Z833 Family history of diabetes mellitus: Secondary | ICD-10-CM

## 2020-09-29 LAB — I-STAT ARTERIAL BLOOD GAS, ED
Acid-Base Excess: 3 mmol/L — ABNORMAL HIGH (ref 0.0–2.0)
Bicarbonate: 29.3 mmol/L — ABNORMAL HIGH (ref 20.0–28.0)
Calcium, Ion: 1.23 mmol/L (ref 1.15–1.40)
HCT: 35 % — ABNORMAL LOW (ref 36.0–46.0)
Hemoglobin: 11.9 g/dL — ABNORMAL LOW (ref 12.0–15.0)
O2 Saturation: 98 %
Patient temperature: 98.5
Potassium: 2.5 mmol/L — CL (ref 3.5–5.1)
Sodium: 130 mmol/L — ABNORMAL LOW (ref 135–145)
TCO2: 31 mmol/L (ref 22–32)
pCO2 arterial: 50.1 mmHg — ABNORMAL HIGH (ref 32.0–48.0)
pH, Arterial: 7.376 (ref 7.350–7.450)
pO2, Arterial: 101 mmHg (ref 83.0–108.0)

## 2020-09-29 LAB — I-STAT VENOUS BLOOD GAS, ED
Acid-Base Excess: 6 mmol/L — ABNORMAL HIGH (ref 0.0–2.0)
Bicarbonate: 29.9 mmol/L — ABNORMAL HIGH (ref 20.0–28.0)
Calcium, Ion: 1.08 mmol/L — ABNORMAL LOW (ref 1.15–1.40)
HCT: 35 % — ABNORMAL LOW (ref 36.0–46.0)
Hemoglobin: 11.9 g/dL — ABNORMAL LOW (ref 12.0–15.0)
O2 Saturation: 99 %
Potassium: 2.9 mmol/L — ABNORMAL LOW (ref 3.5–5.1)
Sodium: 129 mmol/L — ABNORMAL LOW (ref 135–145)
TCO2: 31 mmol/L (ref 22–32)
pCO2, Ven: 39.3 mmHg — ABNORMAL LOW (ref 44.0–60.0)
pH, Ven: 7.49 — ABNORMAL HIGH (ref 7.250–7.430)
pO2, Ven: 151 mmHg — ABNORMAL HIGH (ref 32.0–45.0)

## 2020-09-29 LAB — COMPREHENSIVE METABOLIC PANEL
ALT: 20 U/L (ref 0–44)
AST: 24 U/L (ref 15–41)
Albumin: 4 g/dL (ref 3.5–5.0)
Alkaline Phosphatase: 77 U/L (ref 38–126)
Anion gap: 14 (ref 5–15)
BUN: 52 mg/dL — ABNORMAL HIGH (ref 8–23)
CO2: 24 mmol/L (ref 22–32)
Calcium: 9.6 mg/dL (ref 8.9–10.3)
Chloride: 91 mmol/L — ABNORMAL LOW (ref 98–111)
Creatinine, Ser: 2.01 mg/dL — ABNORMAL HIGH (ref 0.44–1.00)
GFR, Estimated: 27 mL/min — ABNORMAL LOW (ref >60)
Glucose, Bld: 229 mg/dL — ABNORMAL HIGH (ref 70–99)
Potassium: 2.8 mmol/L — ABNORMAL LOW (ref 3.5–5.1)
Sodium: 129 mmol/L — ABNORMAL LOW (ref 135–145)
Total Bilirubin: 0.5 mg/dL (ref 0.3–1.2)
Total Protein: 6.8 g/dL (ref 6.5–8.1)

## 2020-09-29 LAB — I-STAT CHEM 8, ED
BUN: 53 mg/dL — ABNORMAL HIGH (ref 8–23)
Calcium, Ion: 1.07 mmol/L — ABNORMAL LOW (ref 1.15–1.40)
Chloride: 92 mmol/L — ABNORMAL LOW (ref 98–111)
Creatinine, Ser: 2.1 mg/dL — ABNORMAL HIGH (ref 0.44–1.00)
Glucose, Bld: 236 mg/dL — ABNORMAL HIGH (ref 70–99)
HCT: 34 % — ABNORMAL LOW (ref 36.0–46.0)
Hemoglobin: 11.6 g/dL — ABNORMAL LOW (ref 12.0–15.0)
Potassium: 2.8 mmol/L — ABNORMAL LOW (ref 3.5–5.1)
Sodium: 129 mmol/L — ABNORMAL LOW (ref 135–145)
TCO2: 29 mmol/L (ref 22–32)

## 2020-09-29 LAB — GLUCOSE, CAPILLARY
Glucose-Capillary: 279 mg/dL — ABNORMAL HIGH (ref 70–99)
Glucose-Capillary: 202 mg/dL — ABNORMAL HIGH (ref 70–99)

## 2020-09-29 LAB — CBC WITH DIFFERENTIAL/PLATELET
Abs Immature Granulocytes: 0.07 10*3/uL (ref 0.00–0.07)
Basophils Absolute: 0.1 10*3/uL (ref 0.0–0.1)
Basophils Relative: 1 %
Eosinophils Absolute: 0.2 10*3/uL (ref 0.0–0.5)
Eosinophils Relative: 2 %
HCT: 34.7 % — ABNORMAL LOW (ref 36.0–46.0)
Hemoglobin: 11.2 g/dL — ABNORMAL LOW (ref 12.0–15.0)
Immature Granulocytes: 1 %
Lymphocytes Relative: 14 %
Lymphs Abs: 1.6 10*3/uL (ref 0.7–4.0)
MCH: 29.6 pg (ref 26.0–34.0)
MCHC: 32.3 g/dL (ref 30.0–36.0)
MCV: 91.8 fL (ref 80.0–100.0)
Monocytes Absolute: 0.6 10*3/uL (ref 0.1–1.0)
Monocytes Relative: 5 %
Neutro Abs: 8.2 10*3/uL — ABNORMAL HIGH (ref 1.7–7.7)
Neutrophils Relative %: 77 %
Platelets: 160 10*3/uL (ref 150–400)
RBC: 3.78 MIL/uL — ABNORMAL LOW (ref 3.87–5.11)
RDW: 15.4 % (ref 11.5–15.5)
WBC: 10.7 10*3/uL — ABNORMAL HIGH (ref 4.0–10.5)
nRBC: 0 % (ref 0.0–0.2)

## 2020-09-29 LAB — MAGNESIUM: Magnesium: 2.2 mg/dL (ref 1.7–2.4)

## 2020-09-29 LAB — LACTIC ACID, PLASMA: Lactic Acid, Venous: 1.6 mmol/L (ref 0.5–1.9)

## 2020-09-29 LAB — TROPONIN I (HIGH SENSITIVITY): Troponin I (High Sensitivity): 5 ng/L (ref <18)

## 2020-09-29 LAB — MRSA NEXT GEN BY PCR, NASAL: MRSA by PCR Next Gen: DETECTED — AB

## 2020-09-29 MED ORDER — POTASSIUM CHLORIDE 10 MEQ/100ML IV SOLN
10.0000 meq | INTRAVENOUS | Status: AC
  Administered 2020-09-29 – 2020-09-30 (×6): 10 meq via INTRAVENOUS
  Filled 2020-09-29 (×3): qty 100

## 2020-09-29 MED ORDER — PANTOPRAZOLE SODIUM 40 MG IV SOLR
40.0000 mg | Freq: Every day | INTRAVENOUS | Status: DC
Start: 2020-09-29 — End: 2020-10-01
  Administered 2020-09-29 – 2020-09-30 (×2): 40 mg via INTRAVENOUS
  Filled 2020-09-29 (×2): qty 40

## 2020-09-29 MED ORDER — ALBUTEROL SULFATE (2.5 MG/3ML) 0.083% IN NEBU: 2.5000 mg | INHALATION_SOLUTION | RESPIRATORY_TRACT | Status: DC | PRN

## 2020-09-29 MED ORDER — SODIUM CHLORIDE 0.9 % IV SOLN: INTRAVENOUS | Status: DC

## 2020-09-29 MED ORDER — NALOXONE HCL 4 MG/10ML IJ SOLN
2.0000 mg/h | INTRAVENOUS | Status: DC
  Administered 2020-09-29 – 2020-09-30 (×5): 2 mg/h via INTRAVENOUS
  Administered 2020-09-30: 1.5 mg/h via INTRAVENOUS
  Administered 2020-09-30: 1.2 mg/h via INTRAVENOUS
  Filled 2020-09-29 (×12): qty 10

## 2020-09-29 MED ORDER — ROSUVASTATIN CALCIUM 20 MG PO TABS
20.0000 mg | ORAL_TABLET | Freq: Every day | ORAL | Status: DC
  Administered 2020-09-30 – 2020-10-06 (×7): 20 mg via ORAL
  Filled 2020-09-29 (×7): qty 1

## 2020-09-29 MED ORDER — LEVOTHYROXINE SODIUM 112 MCG PO TABS
224.0000 ug | ORAL_TABLET | Freq: Every day | ORAL | Status: DC
  Administered 2020-10-01 – 2020-10-06 (×6): 224 ug via ORAL
  Filled 2020-09-29 (×8): qty 2

## 2020-09-29 MED ORDER — KETAMINE HCL 50 MG/5ML IJ SOSY
PREFILLED_SYRINGE | INTRAMUSCULAR | Status: AC
  Filled 2020-09-29: qty 10

## 2020-09-29 MED ORDER — SODIUM CHLORIDE 0.9 % IV SOLN
INTRAVENOUS | Status: DC | PRN
  Administered 2020-09-29: 500 mL via INTRAVENOUS

## 2020-09-29 MED ORDER — NALOXONE HCL 2 MG/2ML IJ SOSY
PREFILLED_SYRINGE | INTRAMUSCULAR | Status: AC
  Filled 2020-09-29: qty 2

## 2020-09-29 MED ORDER — POLYETHYLENE GLYCOL 3350 17 G PO PACK: 17.0000 g | PACK | Freq: Every day | ORAL | Status: DC | PRN

## 2020-09-29 MED ORDER — DOCUSATE SODIUM 100 MG PO CAPS: 100.0000 mg | ORAL_CAPSULE | Freq: Two times a day (BID) | ORAL | Status: DC | PRN

## 2020-09-29 MED ORDER — NALOXONE HCL 2 MG/2ML IJ SOSY
2.0000 mg | PREFILLED_SYRINGE | Freq: Once | INTRAMUSCULAR | Status: AC
  Administered 2020-09-29: 2 mg via INTRAVENOUS

## 2020-09-29 MED ORDER — KETAMINE HCL 50 MG/5ML IJ SOSY
PREFILLED_SYRINGE | INTRAMUSCULAR | Status: AC
  Filled 2020-09-29: qty 15

## 2020-09-29 MED ORDER — LABETALOL HCL 5 MG/ML IV SOLN
10.0000 mg | INTRAVENOUS | Status: DC | PRN
  Administered 2020-10-04 – 2020-10-06 (×3): 10 mg via INTRAVENOUS
  Filled 2020-09-29 (×4): qty 4

## 2020-09-29 MED ORDER — ASPIRIN EC 81 MG PO TBEC
81.0000 mg | DELAYED_RELEASE_TABLET | Freq: Every day | ORAL | Status: DC
  Administered 2020-09-30 – 2020-10-06 (×7): 81 mg via ORAL
  Filled 2020-09-29 (×7): qty 1

## 2020-09-29 MED ORDER — ALBUTEROL SULFATE HFA 108 (90 BASE) MCG/ACT IN AERS: 2.0000 | INHALATION_SPRAY | RESPIRATORY_TRACT | Status: DC | PRN

## 2020-09-29 MED ORDER — INSULIN ASPART 100 UNIT/ML IJ SOLN
0.0000 [IU] | INTRAMUSCULAR | Status: DC
  Administered 2020-09-29: 4 [IU] via SUBCUTANEOUS
  Administered 2020-09-29: 11 [IU] via SUBCUTANEOUS
  Administered 2020-09-30: 7 [IU] via SUBCUTANEOUS
  Administered 2020-09-30: 4 [IU] via SUBCUTANEOUS
  Administered 2020-09-30: 7 [IU] via SUBCUTANEOUS
  Administered 2020-09-30: 4 [IU] via SUBCUTANEOUS
  Administered 2020-09-30: 7 [IU] via SUBCUTANEOUS
  Administered 2020-09-30 – 2020-10-01 (×2): 4 [IU] via SUBCUTANEOUS
  Administered 2020-10-01: 7 [IU] via SUBCUTANEOUS
  Administered 2020-10-01: 11 [IU] via SUBCUTANEOUS
  Administered 2020-10-01 (×2): 4 [IU] via SUBCUTANEOUS
  Administered 2020-10-01: 7 [IU] via SUBCUTANEOUS
  Administered 2020-10-02 (×4): 4 [IU] via SUBCUTANEOUS
  Administered 2020-10-02: 3 [IU] via SUBCUTANEOUS
  Administered 2020-10-02: 7 [IU] via SUBCUTANEOUS
  Administered 2020-10-03 (×3): 4 [IU] via SUBCUTANEOUS
  Administered 2020-10-03: 3 [IU] via SUBCUTANEOUS
  Administered 2020-10-03 (×3): 4 [IU] via SUBCUTANEOUS
  Administered 2020-10-04 (×4): 3 [IU] via SUBCUTANEOUS
  Administered 2020-10-04: 4 [IU] via SUBCUTANEOUS
  Administered 2020-10-05 – 2020-10-06 (×7): 3 [IU] via SUBCUTANEOUS

## 2020-09-29 MED ORDER — NALOXONE HCL 2 MG/2ML IJ SOSY
PREFILLED_SYRINGE | INTRAMUSCULAR | Status: AC
  Filled 2020-09-29: qty 4

## 2020-09-29 NOTE — ED Provider Notes (Signed)
[facility] 2H CARDIOVASCULAR ICU Provider Note   CSN: [id number] Arrival date & time: 09/29/20  1341     History Chief Complaint  Patient presents with   Drug Overdose    [name] is a 65 y.o. female.  HPI     65yo female with history of breast cancer, asthma, chronic back pain, CKD, hypertension, hyperlipidemia, OSA, schizophrenia, presents with concern for unresponsiveness, possible overdose.    Home health went into her home and found her unresponsive.  Per EMS, she was nearly apneic, breathing 4 times per minute and was given 58m iN narcan with improvement, woke up more, respirations improved to 10, was following commands with no focal abnormalities.  Just prior to arrival is worsening, more somneltn again. History limited by acuity of condition and pt mental status.   Past Medical History:  Diagnosis Date   Anemia    Anxiety    Arthritis    "pretty much all my joints"   Asthma    Benign paroxysmal positional vertigo 12/30/2012   Breast cancer ([place] 02/13/12   ruq  100'clock bx Ductal Carcinoma in Situ,(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   Breast mass in female    L breast 2008, [place] likely fat necrosis   Chronic lower back pain    CKD (chronic kidney disease), stage III ([place]    "lower stage" (01/06/2014)   Daily headache    last time was 708-09-2016(brother passed away)   Depression    Dyspnea    Gait abnormality 05/29/2016   GERD (gastroesophageal reflux disease)    History of blood transfusion 2011   "after one of my OR's"   History of hiatal hernia    History of stomach ulcers    HSV (herpes simplex virus) infection    Hx of radiation therapy 05/05/12- 07/15/12   right breast, 45 gray x 25 fx, lumpectomy cavity boosted to 16.2 gray   Hyperlipemia    Hypertension    sees Dr. 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   Tachycardia    with sx in 2008   Thyroid disease    Type II diabetes mellitus ([place]    dx 2014    Patient Active Problem List   Diagnosis Date Noted   AMS (altered mental status) 09/29/2020   Hyperparathyroidism, secondary ([place] 03/23/2018   Memory difficulty 12/22/2017   Spinal stenosis 06/09/2017   HNP (herniated nucleus pulposus), lumbar 12/05/2016   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   Diabetic neuropathy ([place] 09/08/2016   Hypocalcemia 08/22/2016   Abnormal barium swallow 07/07/2016   Anxiety 07/07/2016   History of malignant neoplasm of breast 07/07/2016   Gait abnormality 05/29/2016   Encephalopathy acute 04/23/2016   Hyponatremia 04/23/2016   Schizophrenia, paranoid ([place]    Aspiration pneumonia of left lower lobe due to gastric secretions (HCC)    Projectile vomiting with nausea    Altered mental status    Schizophrenia ([place]    Disorganized schizophrenia ([place]  Chronic pain syndrome    Acute renal failure (HCC)    Controlled diabetes mellitus type 2 with complications ([place])    Acute encephalopathy    Oropharyngeal dysphagia    Aspiration pneumonia of right lower lobe due to vomit ([place])    Acute renal failure (ARF) ([place]) 03/28/2016   Encephalopathy 03/27/2016   HCAP (healthcare-associated pneumonia) 02/29/2016   Pneumonia 02/14/2016   CAP (community acquired pneumonia) 02/13/2016   Edema 02/13/2016   AKI (acute kidney injury) ([place]) 02/13/2016   Elevated troponin 06/24/2015   Diarrhea 06/23/2015   Sepsis ([place]) 06/23/2015   Obesity, morbid, BMI 50 or higher ([place]) 03/26/2015   Post traumatic stress disorder (PTSD) 11/14/2014   Leukocytosis 11/12/2014   Slurred speech 11/12/2014    CKD (chronic kidney disease) stage 3, GFR 30-59 ml/min (HCC)    Diabetes mellitus, type II, insulin dependent ([place])    TIA (transient ischemic attack)    Hypotension 10/28/2014   Syncope 10/28/2014   Anemia 10/28/2014   Type II diabetes mellitus ([place]) 10/28/2014   OSA on CPAP 10/28/2014   Chronic lower back pain 10/28/2014   Polyneuropathy in diabetes(357.2) 10/28/2014   Diabetic polyneuropathy ([place])    Fall 08/14/2014   Recurrent falls 08/14/2014   Hot flashes 02/24/2014   Arthralgia 02/24/2014   Hair loss 02/24/2014   Chest pain 01/06/2014   Shock ([place]) 08/08/2013   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   Dysarthria 12/30/2012   Benign paroxysmal positional vertigo 12/30/2012   Breast cancer of upper-outer quadrant of right female breast ([place]) 11/18/2012   Hemorrhage of rectum and anus 09/28/2012   Hx of radiation therapy    Syncope and collapse 06/04/2011   HSV 10/05/2008   Mild persistent asthma 06/02/2008   Dyspnea 07/19/2007   HLD (hyperlipidemia) 06/02/2006   Sinus tachycardia 05/25/2006   Hypothyroidism 02/27/2006   Depression 02/27/2006   Essential hypertension 02/27/2006   GERD 02/27/2006   KNEE PAIN 02/27/2006    Past Surgical History:  Procedure Laterality Date   ABDOMINAL HYSTERECTOMY  1979?   partial   benign cyst  Left    removed from left breast   BREAST BIOPSY Right 02/13/2012   BREAST LUMPECTOMY  03/03/2012   Procedure: LUMPECTOMY;  Surgeon: Haywood Lasso, MD;  Location: [facility];  Service: General;  Laterality: Right;   BREAST SURGERY Right 01/2012   "cancer"   CHOLECYSTECTOMY  1980's?   COLONOSCOPY WITH PROPOFOL N/A 09/28/2012   Procedure: COLONOSCOPY WITH PROPOFOL;  Surgeon: Lear Ng, MD;  Location: [facility];  Service: Endoscopy;  Laterality: N/A;   FOOT FRACTURE SURGERY Right 1990's   JOINT REPLACEMENT      x 3   LUMBAR LAMINECTOMY/DECOMPRESSION MICRODISCECTOMY N/A 12/05/2016   Procedure: LEFT L5-S1 MICRODISCECTOMY;   Surgeon: Marybelle Killings, MD;  Location: [facility];  Service: Orthopedics;  Laterality: N/A;   REVISION TOTAL KNEE ARTHROPLASTY  07/2009   SHOULDER OPEN ROTATOR CUFF REPAIR Left 1990's   THYROIDECTOMY  1970's   TOTAL KNEE ARTHROPLASTY Bilateral 2009-06/2009   left; right     OB History   No obstetric history on file.     Family History  Problem Relation Age of Onset   Heart disease Brother        Multiple MIs, starting in his 40s   Diabetes Brother    Thyroid disease Brother    Hypertension Mother    Diabetes Mother    Breast cancer Mother 34   Bone cancer Mother    Hypertension  Sister    Diabetes Sister    Breast cancer Sister 74   Thyroid disease Sister    Breast cancer Maternal Grandmother    Heart disease Maternal Grandmother    Uterine cancer Other 19   Breast cancer Paternal Aunt 35   Breast cancer Paternal Grandmother        dx in her 76s   Prostate cancer Paternal Grandfather    Asthma Son     Social History   Tobacco Use   Smoking status: Former    Packs/day: 1.00    Years: 7.00    Pack years: 7.00    Types: Cigarettes    Quit date: 03/24/1992    Years since quitting: 28.5   Smokeless tobacco: Never  Vaping Use   Vaping Use: Never used  Substance Use Topics   Alcohol use: No    Comment: "stopped drinking in 1996; I was an alcoholic"   Drug use: No    Home Medications Prior to Admission medications   Medication Sig Start Date End Date Taking? Authorizing Provider  albuterol (VENTOLIN HFA) 108 (90 Base) MCG/ACT inhaler Inhale 2 puffs into the lungs every 4 (four) hours as needed for wheezing or shortness of breath.     [provider]  aspirin EC 81 MG tablet Take 1 tablet (81 mg total) by mouth daily. 08/12/13   Viyuoh, Alison Stalling, MD  B Complex-C (B-COMPLEX WITH VITAMIN C) tablet Take 1 tablet by mouth daily.    [provider]  budesonide-formoterol (SYMBICORT) 80-4.5 MCG/ACT inhaler Inhale 2 puffs into the lungs 2 (two) times  daily. Patient taking differently: Inhale 1 puff into the lungs 3 (three) times daily. PRN 10/28/16   Tanda Rockers, MD  calcitRIOL (ROCALTROL) 0.25 MCG capsule Take 1 capsule (0.25 mcg total) by mouth 3 (three) times a week. 08/03/20   Renato Shin, MD  Cholecalciferol (VITAMIN D-3) 1000 units CAPS Take 1,000 Units by mouth daily.    [provider]  Cyanocobalamin (VITAMIN B-12 PO) Take 1 tablet by mouth daily.    [provider]  diphenhydrAMINE (BENADRYL) 25 MG tablet Take 50 mg by mouth every 6 (six) hours as needed for itching or allergies.    [provider]  doxepin (SINEQUAN) 25 MG capsule Take 50 mg by mouth at bedtime.    [provider]  esomeprazole (NEXIUM) 40 MG capsule Take 1 capsule (40 mg total) by mouth daily before breakfast. Patient taking differently: Take 40 mg by mouth daily at 12 noon. 10/30/14   Barton Dubois, MD  furosemide (LASIX) 20 MG tablet Take 2 tablets (40 mg total) by mouth daily. 09/09/19   Renato Shin, MD  Insulin NPH, Human,, Isophane, (HUMULIN N KWIKPEN) 100 UNIT/ML Kiwkpen Inject 34 Units into the skin every morning. And pen needles 1/day 07/10/20   Renato Shin, MD  Iron-Vitamins (GERITOL PO) Take 1 tablet by mouth daily.    [provider]  Lancets [facility] DELICA PLUS [id number]) [place] 1 each by Other route 3 (three) times daily. Use to check blood sugar 3 times a day 02/16/19   Renato Shin, MD  levothyroxine (SYNTHROID) 112 MCG tablet Take 2 tablets (224 mcg total) by mouth daily. 07/10/20   Renato Shin, MD  linaclotide [facility]) 290 MCG CAPS capsule Take 290 mcg by mouth daily before breakfast.    [provider]  LORazepam (ATIVAN) 0.5 MG tablet Take 0.5 mg by mouth 3 (three) times daily.    [provider]  losartan-hydrochlorothiazide (HYZAAR) 100-12.5 MG tablet Take 1 tablet by mouth daily.    [provider]  methocarbamol (ROBAXIN) 500 MG tablet Take 1 tablet (500 mg total)  by mouth every 8 (eight) hours as needed for muscle spasms. 02/13/17   Leandrew Koyanagi, MD  Multiple Vitamin (MULTIVITAMIN WITH MINERALS) TABS tablet Take 1 tablet by mouth daily.    [provider]  [facility] VERIO test strip TEST BLOOD SUGAR THREE TIMES DAILY 03/07/20   Renato Shin, MD  oxyCODONE (ROXICODONE) 15 MG immediate release tablet Take 15 mg by mouth every 6 (six) hours as needed for pain.  11/16/17   [provider]  pregabalin (LYRICA) 300 MG capsule Take 300 mg by mouth 2 (two) times daily.    [provider]  rosuvastatin (CRESTOR) 20 MG tablet Take 20 mg by mouth daily.    [provider]  SURE COMFORT PEN NEEDLES 32G X 6 MM MISC test FOUR TIMES DAILY 04/28/20   Renato Shin, MD  tiZANidine (ZANAFLEX) 4 MG tablet Take 4 mg by mouth 2 (two) times daily.  03/25/16   [provider]  TRULICITY 4.5 KA/7.6OT SOPN Inject 4.5 mg into the skin once a week. 09/06/20   Renato Shin, MD  valACYclovir (VALTREX) 1000 MG tablet Take 1,000 mg by mouth 2 (two) times daily.     [provider]  venlafaxine XR (EFFEXOR-XR) 150 MG 24 hr capsule Take 150 mg by mouth 2 (two) times daily. 05/21/15   [provider]  ziprasidone (GEODON) 60 MG capsule Take 60 mg by mouth daily.    [provider]    Allergies    Bee venom, Invokana [canagliflozin], Shellfish allergy, Buprenorphine hcl, and Other  Review of Systems   Review of Systems  Unable to perform ROS: Mental status change   Physical Exam Updated Vital Signs BP (!) 144/82   Pulse 92   Temp 98.4 F (36.9 C) (Oral)   Resp 14   SpO2 100%   Physical Exam Vitals and nursing note reviewed.  Constitutional:      General: She is not in acute distress.    Appearance: She is well-developed. She is ill-appearing. She is not diaphoretic.  HENT:     Head: Normocephalic and atraumatic.  Eyes:     Conjunctiva/sclera: Conjunctivae normal.  Cardiovascular:     Rate and Rhythm:  Normal rate and regular rhythm.     Heart sounds: Normal heart sounds. No murmur heard.   No friction rub. No gallop.  Pulmonary:     Effort: Pulmonary effort is normal. No respiratory distress.     Breath sounds: Normal breath sounds. No wheezing or rales.  Abdominal:     General: There is no distension.     Palpations: Abdomen is soft.     Tenderness: There is no abdominal tenderness. There is no guarding.  Musculoskeletal:        General: No tenderness.     Cervical back: Normal range of motion.  Skin:    General: Skin is warm and dry.     Findings: No erythema or rash.  Neurological:     GCS: GCS eye subscore is 2. GCS verbal subscore is 2. GCS motor subscore is 6.     Comments: somnolent    ED Results / Procedures / Treatments   Labs (all labs ordered are listed, but only abnormal results are displayed) Labs Reviewed  MRSA NEXT GEN BY PCR, NASAL - Abnormal; Notable for the following  components:      Result Value   MRSA by PCR Next Gen DETECTED (*)    All other components within normal limits  CBC WITH DIFFERENTIAL/PLATELET - Abnormal; Notable for the following components:   WBC 10.7 (*)    RBC 3.78 (*)    Hemoglobin 11.2 (*)    HCT 34.7 (*)    Neutro Abs 8.2 (*)    All other components within normal limits  COMPREHENSIVE METABOLIC PANEL - Abnormal; Notable for the following components:   Sodium 129 (*)    Potassium 2.8 (*)    Chloride 91 (*)    Glucose, Bld 229 (*)    BUN 52 (*)    Creatinine, Ser 2.01 (*)    GFR, Estimated 27 (*)    All other components within normal limits  GLUCOSE, CAPILLARY - Abnormal; Notable for the following components:   Glucose-Capillary 279 (*)    All other components within normal limits  GLUCOSE, CAPILLARY - Abnormal; Notable for the following components:   Glucose-Capillary 202 (*)    All other components within normal limits  I-STAT ARTERIAL BLOOD GAS, ED - Abnormal; Notable for the following components:   pCO2 arterial 50.1 (*)     Bicarbonate 29.3 (*)    Acid-Base Excess 3.0 (*)    Sodium 130 (*)    Potassium 2.5 (*)    HCT 35.0 (*)    Hemoglobin 11.9 (*)    All other components within normal limits  I-STAT VENOUS BLOOD GAS, ED - Abnormal; Notable for the following components:   pH, Ven 7.490 (*)    pCO2, Ven 39.3 (*)    pO2, Ven 151.0 (*)    Bicarbonate 29.9 (*)    Acid-Base Excess 6.0 (*)    Sodium 129 (*)    Potassium 2.9 (*)    Calcium, Ion 1.08 (*)    HCT 35.0 (*)    Hemoglobin 11.9 (*)    All other components within normal limits  I-STAT CHEM 8, ED - Abnormal; Notable for the following components:   Sodium 129 (*)    Potassium 2.8 (*)    Chloride 92 (*)    BUN 53 (*)    Creatinine, Ser 2.10 (*)    Glucose, Bld 236 (*)    Calcium, Ion 1.07 (*)    Hemoglobin 11.6 (*)    HCT 34.0 (*)    All other components within normal limits  RESP PANEL BY RT-PCR (FLU A&B, COVID) [id number]  CULTURE, BLOOD (ROUTINE X 2)  CULTURE, BLOOD (ROUTINE X 2)  LACTIC ACID, PLASMA  MAGNESIUM  LACTIC ACID, PLASMA  RAPID URINE DRUG SCREEN, HOSP PERFORMED  ACETAMINOPHEN LEVEL  TSH  T4, FREE  HIV ANTIBODY (ROUTINE TESTING W REFLEX)  COMPREHENSIVE METABOLIC PANEL  MAGNESIUM  PHOSPHORUS  PROCALCITONIN  CORTISOL  CBC  PROTIME-INR  APTT  STREP PNEUMONIAE URINARY ANTIGEN  BLOOD GAS, ARTERIAL  AMYLASE  LIPASE, BLOOD  URINALYSIS, ROUTINE W REFLEX MICROSCOPIC  BRAIN NATRIURETIC PEPTIDE  HEMOGLOBIN A1C  CBC  BASIC METABOLIC PANEL  BLOOD GAS, ARTERIAL  TROPONIN I (HIGH SENSITIVITY)  TROPONIN I (HIGH SENSITIVITY)    EKG EKG Interpretation  Date/Time:  Saturday September 29 2020 14:03:24 EDT Ventricular Rate:  96 PR Interval:  201 QRS Duration: 100 QT Interval:  414 QTC Calculation: 524 R Axis:   -27 Text Interpretation: Sinus rhythm Borderline left axis deviation Low voltage, precordial leads Abnormal R-wave progression, early transition Prolonged QT interval Confirmed by Octaviano Glow [phone]) on 09/29/2020  3:10:04 PM  Radiology CT HEAD WO CONTRAST (5MM)  Result Date: 09/29/2020 CLINICAL DATA:  Delirium.  Found unresponsive by home health aide. EXAM: CT HEAD WITHOUT CONTRAST TECHNIQUE: Contiguous axial images were obtained from the base of the skull through the vertex without intravenous contrast. COMPARISON:  Head CT 04/23/2016 FINDINGS: Brain: Mild motion artifact limitations. Allowing for this, no intracranial hemorrhage. No mass effect or midline shift. No hydrocephalus. The basilar cisterns are patent. No evidence of territorial infarct or acute ischemia. Partially empty sella, chronic and unchanged from prior. No extra-axial or intracranial fluid collection. Vascular: No hyperdense vessel. Skull: No fracture or focal lesion. Sinuses/Orbits: Mucosal thickening throughout ethmoid air cells. No sinus fluid levels. Chronic bilateral proptosis. No acute orbital findings. No mastoid effusion. Other: 11 mm soft tissue nodularity of the left occipital scalp appears similar to prior imaging. IMPRESSION: No acute intracranial abnormality, allowing for mild motion artifact limitations. Electronically Signed   By: Keith Rake M.D.   On: 09/29/2020 17:06   CT Chest Wo Contrast  Result Date: 09/29/2020 CLINICAL DATA:  Persistent cough and recently unresponsive, possible oxycodone overdose EXAM: CT CHEST WITHOUT CONTRAST TECHNIQUE: Multidetector CT imaging of the chest was performed following the standard protocol without IV contrast. COMPARISON:  Chest x-ray from earlier in the same day, CT from 02/14/2016. FINDINGS: Cardiovascular: Atherosclerotic calcifications of the thoracic aorta are noted. Coronary calcifications are seen. No aneurysmal dilatation is seen. No cardiac enlargement is noted. Pulmonary artery is not significantly enlarged. Mediastinum/Nodes: Postsurgical changes are noted in the thoracic inlet consistent with prior thyroidectomy. Tiny mediastinal lymph nodes are noted. No sizable hilar or  mediastinal adenopathy is noted. Small sliding-type hiatal hernia is noted. The esophagus is otherwise within normal limits. Lungs/Pleura: Lungs are well aerated bilaterally with the exception of mild bibasilar atelectasis. No focal confluent infiltrate is seen. No sizable parenchymal nodule is noted. Changes of mild pulmonary edema are noted. Upper Abdomen: Visualized upper abdomen appears within normal limits. Musculoskeletal: Mild degenerative changes of the thoracic spine are seen. No acute bony abnormality is noted. IMPRESSION: Changes of mild pulmonary edema. Small sliding-type hiatal hernia. No other significant abnormality is seen. Aortic Atherosclerosis (ICD10-I70.0). Electronically Signed   By: Inez Catalina M.D.   On: 09/29/2020 17:04   DG Chest Port 1 View  Result Date: 09/29/2020 CLINICAL DATA:  Central line placement EXAM: PORTABLE CHEST 1 VIEW COMPARISON:  September 29, 2020 FINDINGS: Stable cardiomegaly. The hila and mediastinum are unremarkable. A new left central line terminates in the central SVC. No pneumothorax. Suggested mild pulmonary edema, unchanged. Patchy bibasilar opacities may represent atelectasis or infiltrates. IMPRESSION: Cardiomegaly and probable mild edema. Patchy opacity in the left greater than right bases could represent atelectasis or infiltrate. Recommend attention on follow-up. The new left central line is in good position terminating in the SVC with no pneumothorax. Electronically Signed   By: Dorise Bullion III M.D.   On: 09/29/2020 20:24   DG Chest Portable 1 View  Result Date: 09/29/2020 CLINICAL DATA:  Altered mental status EXAM: PORTABLE CHEST 1 VIEW COMPARISON:  04/20/2019 FINDINGS: Cardiomegaly. Low lung volumes. Patchy bibasilar opacities. Diffuse interstitial prominence with probable small bilateral pleural effusions. No pneumothorax. IMPRESSION: Cardiomegaly with suggestion of mild pulmonary edema. Patchy bibasilar opacities may represent atelectasis, edema,  or pneumonia. Electronically Signed   By: Davina Poke D.O.   On: 09/29/2020 15:26    Procedures .Critical Care  Date/Time: 09/29/2020 11:01 PM Performed by: Gareth Morgan, MD Authorized by: Gareth Morgan, MD  Critical care provider statement:    Critical care time (minutes):  45   Critical care was time spent personally by me on the following activities:  Discussions with consultants, evaluation of patient's response to treatment, examination of patient, ordering and performing treatments and interventions, ordering and review of laboratory studies, ordering and review of radiographic studies, pulse oximetry, re-evaluation of patient's condition, obtaining history from patient or surrogate and review of old charts Ultrasound ED Peripheral IV (Provider)  Date/Time: 09/29/2020 11:02 PM Performed by: Gareth Morgan, MD Authorized by: Gareth Morgan, MD   Procedure details:    Indications: poor IV access     Location:  Right AC   Angiocath:  20 G   Bedside Ultrasound Guided: Yes     Images: not archived     Patient tolerated procedure without complications: Yes     Dressing applied: Yes     Medications Ordered in ED Medications  naloxone HCl (NARCAN) 4 mg in dextrose 5 % 250 mL infusion (2 mg/hr Intravenous Infusion Verify 09/29/20 2200)  docusate sodium (COLACE) capsule 100 mg (has no administration in time range)  polyethylene glycol (MIRALAX / GLYCOLAX) packet 17 g (has no administration in time range)  pantoprazole (PROTONIX) injection 40 mg (40 mg Intravenous Given 09/29/20 2148)  0.9 %  sodium chloride infusion ( Intravenous Infusion Verify 09/29/20 2200)  potassium chloride 10 mEq in 100 mL IVPB (10 mEq Intravenous New Bag/Given 09/29/20 2256)  aspirin EC tablet 81 mg (has no administration in time range)  levothyroxine (SYNTHROID) tablet 224 mcg (has no administration in time range)  rosuvastatin (CRESTOR) tablet 20 mg (has no administration in time range)   labetalol (NORMODYNE) injection 10 mg (has no administration in time range)  insulin aspart (novoLOG) injection 0-20 Units (4 Units Subcutaneous Given 09/29/20 2110)  albuterol (PROVENTIL) (2.5 MG/3ML) 0.083% nebulizer solution 2.5 mg (has no administration in time range)  0.9 %  sodium chloride infusion ( Intravenous Stopped 09/29/20 2150)  naloxone (NARCAN) 2 MG/2ML injection (  Given 09/29/20 1434)  naloxone (NARCAN) 2 MG/2ML injection (  Given 09/29/20 1434)  naloxone (NARCAN) 2 MG/2ML injection (  Given 09/29/20 1434)  naloxone (NARCAN) injection 2 mg (2 mg Intravenous Given 09/29/20 1435)    ED Course  I have reviewed the triage vital signs and the nursing notes.  Pertinent labs & imaging results that were available during my care of the patient were reviewed by me and considered in my medical decision making (see chart for details).    MDM Rules/Calculators/A&P                            65yo female with history of breast cancer, asthma, chronic back pain, CKD, hypertension, hyperlipidemia, OSA, schizophrenia, presents with concern for unresponsiveness.  Responded to narcan with EMS however become somnolent again. Given narcan with improvement again however not resolution.  Medication list significant for oxycodone and ativan and suspect polypharmacy and OSA/obesity hypoventilation.  No focal finding noted on exam and noted to have some response to narcan although not lasting or curative.  Placed on narcan gtt. She appears to be protecting airway now, however is high risk of needing intubation and developing hypercarbia. Discussed with PCCM who will evaluate.     Final Clinical Impression(s) / ED Diagnoses Final diagnoses:  Somnolence    Rx / DC Orders ED Discharge Orders     None        Heran Campau,  Junie Panning, MD 09/29/20 2303

## 2020-09-29 NOTE — Procedures (Signed)
Central Venous Catheter Insertion Procedure Note  [name]  [id number]  02-03-1956  Date:09/29/20  Time:8:11 PM   Provider Performing:Seong-Joo Carson Myrtle   Procedure: Insertion of Non-tunneled Central Venous Catheter(36556) without US guidance  Indication(s) Medication administration and Difficult access  Consent Risks of the procedure as well as the alternatives and risks of each were explained to the patient and/or caregiver.  Consent for the procedure was obtained and is signed in the bedside chart.  Consent obtained from patient's son [name]).  Anesthesia Topical only with 1% lidocaine   Timeout Verified patient identification, verified procedure, site/side was marked, verified correct patient position, special equipment/implants available, medications/allergies/relevant history reviewed, required imaging and test results available.  Sterile Technique Maximal sterile technique including full sterile barrier drape, hand hygiene, sterile gown, sterile gloves, mask, hair covering, sterile ultrasound probe cover (if used).  Procedure Description Area of catheter insertion was cleaned with chlorhexidine and draped in sterile fashion.  Without real-time ultrasound guidance a central venous catheter was placed into the left subclavian vein. Nonpulsatile blood flow and easy flushing noted in all ports.  The catheter was sutured in place and sterile dressing applied.  Complications/Tolerance None; patient tolerated the procedure well. Chest X-ray is ordered to verify placement for internal jugular or subclavian cannulation.     EBL Minimal  Specimen(s) None  Renee Pain, MD Board Certified by the ABIM, [place]

## 2020-09-29 NOTE — ED Triage Notes (Signed)
Patient BIB [facility] from home. Homehealth aid in to check on pt, found patient unresponsive in wheelchair, RR 2, EMS administered 2 mg narcan IN RR to 10. Pt alert to pain.

## 2020-09-29 NOTE — ED Notes (Signed)
Report called to [facility]

## 2020-09-29 NOTE — ED Provider Notes (Signed)
65 yo female here as suspected of possible overdose, unresponsive at home, responded to narcan by EMS and on arrival.  VBG and ABG with no significant acidosis or pco2 retention.  ICU team consulted and have admitted patient.  Patient somnolent in ED, but 99% and breathing regularly with audible breath sounds bilaterally.  I spoke to her son by phone who was not with the patient today but reports she has a prior history of "taking too many pain meds," although no known hx of hospital overdose.  He denies hearing of SI.  PDMP as noted below - patient gets oxycodone ER xtampza, oxycodin 15 HL, pregabalin, and ativan 1 mg tablets x 90, refilled monthly.  Pregabalin 300 Mg Capsule 60.00  30  Ra Mcc  [id number]  Jlo ([id number])  0/0  4.02 LME  Medicare  [place]        08/31/2020  08/31/2020   1  Lorazepam 1 Mg Tablet 90.00  30  Ra Mcc  [id number]  Jlo (0826)  0/0  3.00 LME  Medicare  [place]    08/31/2020  08/31/2020   1  Xtampza Er 27 Mg Capsule 30.00  30  Ra Mcc  [id number]  Jlo (0177)  0/0  45.00 MME  Medicare  [place]    08/31/2020  08/31/2020   1  Oxycodone Hcl (Ir) 15 Mg Tab 150.00  30  Ra Mcc  [id number]  Jlo (0092)  0/0  112.50 MME  Medicare  [place]     Wyvonnia Dusky, MD 09/29/20 1835

## 2020-09-29 NOTE — H&P (Signed)
NAME:  [name], MRN:  [id number], DOB:  05/26/1955, LOS: 0 ADMISSION DATE:  09/29/2020, CONSULTATION DATE: 09/29/2020 REFERRING MD: Emergency Department physician, CHIEF COMPLAINT: Altered mental status  History of Present Illness:  65 year old female with a plethora of health issues that are well documented below.  She is seen by healthcare worker 09/29/2020 in her wheelchair but poorly responsive.  EMS was activated she is transported to [facility] emergency department where she is evaluated by the emergency department physician.  She was given Narcan in route and twice more in the emergency room with improvement in respirations from 4-10.  Arterial blood gases are adequate therefore she will be admitted to intensive care without being intubated at this time. Weanable drug screen her for possible overdose.  Continue Narcan 2 mg an hour drip.  CT of the head to rule out some type of organic dysfunction. She may need intubation in the near future.  Pertinent  Medical History   Past Medical History:  Diagnosis Date   Anemia    Anxiety    Arthritis    "pretty much all my joints"   Asthma    Benign paroxysmal positional vertigo 12/30/2012   Breast cancer ([place]) 02/13/12   ruq  100'clock bx Ductal Carcinoma in Situ,(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   Breast mass in female    L breast 2008, US showed likely fat necrosis   Chronic lower back pain    CKD (chronic kidney disease), stage III ([place])    "lower stage" (01/06/2014)   Daily headache    last time was 01-Sep-2016 (brother passed away)   Depression    Dyspnea    Gait abnormality 05/29/2016   GERD (gastroesophageal reflux disease)    History of blood transfusion 2011   "after one of my OR's"   History of hiatal hernia    History of stomach ulcers    HSV (herpes simplex virus) infection    Hx of radiation therapy 05/05/12- 07/15/12   right breast, 45 gray x 25 fx, lumpectomy cavity boosted to 16.2 gray   Hyperlipemia    Hypertension     sees Dr. 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 rate (!) 21, SpO2 100 %.       No intake or output data in the 24 hours ending 09/29/20 1513 There were no vitals filed for this visit.  Examination: General: Morbidly obese female who is currently on noninvasive nonrebreather. HENT: Short neck no obvious JVD mild upper airway stridor Lungs: Decreased breath  sounds throughout Cardiovascular: Heart sounds are distant Abdomen: Morbidly obese Extremities: 2-3+ edema Neuro: Arouses to noxious stimuli and then goes right back to GU: [facility] Problem list     Assessment & Plan:  Altered mental status in the setting of presumed hypercarbic respiratory failure with questionable drug interactions currently on Narcan drip with improved respiratory status and mental status.  Known obesity-elation syndrome and OSA and COPD. Admit to the intensive care unit Narcan drip at 2 mg an hour Low oxygen to keep sats greater than 90% Avoid all sedation Close monitoring in the unit May need intubation if not responsive to Narcan and other interventions CT of the head  wihen stable  Known paranoid schizophrenia Hold psychotropics at this time  History of renal insufficiency Lab Results  Component Value Date   CREATININE 2.10 (H) 09/29/2020   CREATININE 1.37 (H) 05/08/2020   CREATININE 1.47 (H) 06/15/2019   CREATININE 1.1 03/26/2015   CREATININE 1.2 (H) 06/26/2014   CREATININE 1.6 (H) 04/17/2014  Avoid nephrotoxic  Diabetes mellitus CBG (last 3)  No results for input(s): GLUCAP in the last 72 hours.   Best Practice (right click and "Reselect all SmartList Selections" daily)   Diet/type: NPO DVT prophylaxis: not indicated GI prophylaxis: PPI Lines: N/A Foley:  N/A Code Status:  full code Last date of multidisciplinary goals of care discussion [TBD]  Labs   CBC: Recent Labs  Lab 09/29/20 1452 09/29/20 1453  HGB 11.6* 11.9*  11.9*  HCT 34.0* 35.0*  35.0*    Basic Metabolic Panel: Recent Labs  Lab 09/29/20 1452 09/29/20 1453  NA 129* 129*  130*  K 2.8* 2.9*  2.5*  CL 92*  --   GLUCOSE 236*  --   BUN 53*  --   CREATININE 2.10*  --    GFR: CrCl cannot be calculated (Unknown ideal weight.). No results for input(s): PROCALCITON, WBC, LATICACIDVEN in the last 168 hours.  Liver Function Tests: No results for input(s): AST, ALT, ALKPHOS, BILITOT, PROT, ALBUMIN in the last 168 hours. No results for input(s): LIPASE, AMYLASE in the last 168 hours. No results for input(s): AMMONIA in the last 168 hours.  ABG    Component Value Date/Time   PHART 7.376 09/29/2020 1453   PCO2ART 50.1 (H) 09/29/2020 1453   PO2ART 101 09/29/2020 1453   HCO3 29.3 (H) 09/29/2020 1453   HCO3 29.9 (H) 09/29/2020 1453   TCO2 31 09/29/2020 1453   TCO2 31 09/29/2020 1453   ACIDBASEDEF 2.0 02/29/2016 1254   O2SAT 98.0 09/29/2020 1453   O2SAT 99.0 09/29/2020 1453     Coagulation Profile: No results for input(s): INR, PROTIME in the last 168 hours.  Cardiac Enzymes: No results for input(s): CKTOTAL, CKMB, CKMBINDEX, TROPONINI in the last 168  hours.  HbA1C: Hemoglobin A1C  Date/Time Value Ref Range Status  07/10/2020 01:45 PM 9.0 (A) 4.0 - 5.6 % Final  03/06/2020 02:48 PM 8.5 (A) 4.0 - 5.6 % Final   Hgb A1c MFr Bld  Date/Time Value Ref Range Status  11/14/2016 10:30 AM 6.8 (H) 4.8 - 5.6 % Final    Comment:    (NOTE) Pre diabetes:          5.7%-6.4% Diabetes:              >6.4% Glycemic control for   <7.0% adults with diabetes   02/16/2016 06:00 AM 6.1 (H) 4.8 - 5.6 % Final    Comment:    (NOTE)  Pre-diabetes: 5.7 - 6.4         Diabetes: >6.4         Glycemic control for adults with diabetes: <7.0     CBG: No results for input(s): GLUCAP in the last 168 hours.  Review of Systems:   na  Past Medical History:  She,  has a past medical history of Anemia, Anxiety, Arthritis, Asthma, Benign paroxysmal positional vertigo (12/30/2012), Breast cancer ([place]) (02/13/12), Breast mass in female, Chronic lower back pain, CKD (chronic kidney disease), stage III ([place]), Daily headache, Depression, Dyspnea, Gait abnormality (05/29/2016), GERD (gastroesophageal reflux disease), History of blood transfusion (2011), History of hiatal hernia, History of stomach ulcers, HSV (herpes simplex virus) infection, radiation therapy (05/05/12- 07/15/12), Hyperlipemia, Hypertension, Hypothyroidism, Kidney stones, Knee pain, bilateral, Memory difficulty (12/22/2017), Migraines, Obesity, OSA on CPAP, Pancreatitis, Personal history of radiation therapy (02/2012), Pneumonia (1950's), Polyneuropathy in diabetes(357.2), Schizophrenia ([place]), Secondary parkinsonism ([place]) (12/30/2012), Tachycardia, Thyroid disease, and Type II diabetes mellitus ([place]).   Surgical History:   Past Surgical History:  Procedure Laterality Date   ABDOMINAL HYSTERECTOMY  1979?   partial   benign cyst  Left    removed from left breast   BREAST BIOPSY Right 02/13/2012   BREAST LUMPECTOMY  03/03/2012   Procedure: LUMPECTOMY;  Surgeon: Haywood Lasso, MD;  Location: [facility];  Service: General;  Laterality: Right;   BREAST SURGERY Right 01/2012   "cancer"   CHOLECYSTECTOMY  1980's?   COLONOSCOPY WITH PROPOFOL N/A 09/28/2012   Procedure: COLONOSCOPY WITH PROPOFOL;  Surgeon: Lear Ng, MD;  Location: [facility];  Service: Endoscopy;  Laterality: N/A;   FOOT FRACTURE SURGERY Right 1990's   JOINT REPLACEMENT      x 3   LUMBAR LAMINECTOMY/DECOMPRESSION MICRODISCECTOMY N/A 12/05/2016   Procedure: LEFT L5-S1 MICRODISCECTOMY;  Surgeon: Marybelle Killings, MD;  Location: [facility];  Service: Orthopedics;  Laterality: N/A;   REVISION TOTAL KNEE ARTHROPLASTY  07/2009   SHOULDER OPEN ROTATOR CUFF REPAIR Left 1990's   THYROIDECTOMY  1970's   TOTAL KNEE ARTHROPLASTY Bilateral 2009-06/2009   left; right     Social History:   reports that she quit smoking about 28 years ago. Her smoking use included cigarettes. She has a 7.00 pack-year smoking history. She has never used smokeless tobacco. She reports that she does not drink alcohol and does not use drugs.   Family History:  Her family history includes Asthma in her son; Bone cancer in her mother; Breast cancer in her maternal grandmother and paternal grandmother; Breast cancer (age of onset: 2) in her paternal aunt; Breast cancer (age of onset: 38) in her sister; Breast cancer (age of onset: 69) in her mother; Diabetes in her brother, mother, and sister; Heart disease in her brother and maternal grandmother; Hypertension in her mother and sister; Prostate cancer in her paternal grandfather; Thyroid disease in her brother and sister; Uterine cancer (age of onset: 14) in an other family member.   Allergies Allergies  Allergen Reactions   Bee Venom Anaphylaxis   Invokana [Canagliflozin] Anaphylaxis    Dyspnea and urinary retention   Shellfish Allergy Anaphylaxis   Buprenorphine Hcl Other (See Comments)    Overly sedated with morphine drip.   Other Other (See Comments)    Cats - bad asthma, stopped up     Home  Medications  Prior to Admission medications   Medication Sig Start Date End Date Taking? Authorizing Provider  albuterol (VENTOLIN HFA) 108 (90 Base) MCG/ACT inhaler Inhale 2  puffs into the lungs every 4 (four) hours as needed for wheezing or shortness of breath.     [provider]  aspirin EC 81 MG tablet Take 1 tablet (81 mg total) by mouth daily. 08/12/13   Viyuoh, Alison Stalling, MD  B Complex-C (B-COMPLEX WITH VITAMIN C) tablet Take 1 tablet by mouth daily.    [provider]  budesonide-formoterol (SYMBICORT) 80-4.5 MCG/ACT inhaler Inhale 2 puffs into the lungs 2 (two) times daily. Patient taking differently: Inhale 1 puff into the lungs 3 (three) times daily. PRN 10/28/16   Tanda Rockers, MD  calcitRIOL (ROCALTROL) 0.25 MCG capsule Take 1 capsule (0.25 mcg total) by mouth 3 (three) times a week. 08/03/20   Renato Shin, MD  Cholecalciferol (VITAMIN D-3) 1000 units CAPS Take 1,000 Units by mouth daily.    [provider]  Cyanocobalamin (VITAMIN B-12 PO) Take 1 tablet by mouth daily.    [provider]  diphenhydrAMINE (BENADRYL) 25 MG tablet Take 50 mg by mouth every 6 (six) hours as needed for itching or allergies.    [provider]  doxepin (SINEQUAN) 25 MG capsule Take 50 mg by mouth at bedtime.    [provider]  esomeprazole (NEXIUM) 40 MG capsule Take 1 capsule (40 mg total) by mouth daily before breakfast. Patient taking differently: Take 40 mg by mouth daily at 12 noon. 10/30/14   Barton Dubois, MD  furosemide (LASIX) 20 MG tablet Take 2 tablets (40 mg total) by mouth daily. 09/09/19   Renato Shin, MD  Insulin NPH, Human,, Isophane, (HUMULIN N KWIKPEN) 100 UNIT/ML Kiwkpen Inject 34 Units into the skin every morning. And pen needles 1/day 07/10/20   Renato Shin, MD  Iron-Vitamins (GERITOL PO) Take 1 tablet by mouth daily.    [provider]  Lancets [facility] DELICA PLUS [id number]) [place] 1 each by Other route 3 (three)  times daily. Use to check blood sugar 3 times a day 02/16/19   Renato Shin, MD  levothyroxine (SYNTHROID) 112 MCG tablet Take 2 tablets (224 mcg total) by mouth daily. 07/10/20   Renato Shin, MD  linaclotide [facility]) 290 MCG CAPS capsule Take 290 mcg by mouth daily before breakfast.    [provider]  LORazepam (ATIVAN) 0.5 MG tablet Take 0.5 mg by mouth 3 (three) times daily.    [provider]  losartan-hydrochlorothiazide (HYZAAR) 100-12.5 MG tablet Take 1 tablet by mouth daily.    [provider]  methocarbamol (ROBAXIN) 500 MG tablet Take 1 tablet (500 mg total) by mouth every 8 (eight) hours as needed for muscle spasms. 02/13/17   Leandrew Koyanagi, MD  Multiple Vitamin (MULTIVITAMIN WITH MINERALS) TABS tablet Take 1 tablet by mouth daily.    [provider]  [facility] VERIO test strip TEST BLOOD SUGAR THREE TIMES DAILY 03/07/20   Renato Shin, MD  oxyCODONE (ROXICODONE) 15 MG immediate release tablet Take 15 mg by mouth every 6 (six) hours as needed for pain.  11/16/17   [provider]  pregabalin (LYRICA) 300 MG capsule Take 300 mg by mouth 2 (two) times daily.    [provider]  rosuvastatin (CRESTOR) 20 MG tablet Take 20 mg by mouth daily.    [provider]  SURE COMFORT PEN NEEDLES 32G X 6 MM MISC test FOUR TIMES DAILY 04/28/20   Renato Shin, MD  tiZANidine (ZANAFLEX) 4 MG tablet Take 4 mg by mouth 2 (two) times daily.  03/25/16   [provider]  TRULICITY 4.5 PR/9.4VO SOPN Inject 4.5 mg into the skin once a week. 09/06/20   Renato Shin, MD  valACYclovir (VALTREX) 1000 MG tablet Take 1,000 mg by mouth 2 (two) times daily.     [provider]  venlafaxine XR (EFFEXOR-XR) 150 MG 24 hr capsule Take 150 mg by mouth 2 (two) times daily. 05/21/15   [provider]  ziprasidone (GEODON) 60 MG capsule Take 60 mg by mouth daily.    [provider]     Critical care time: 45 min    Richardson Landry Leverne Tessler  ACNP Acute Care Nurse Practitioner [place] Please consult Amion 09/29/2020, 3:14 PM

## 2020-09-29 NOTE — ED Notes (Signed)
Black dooney and burke purse given to son,

## 2020-09-29 NOTE — Progress Notes (Signed)
Report from day shift included information regarding leaking PIV access, CCM paged for assessment of pt vascular access, subclavian central line placed expertly by CCM, xray verification completed and CVC able to be used per Dr Ricard Dillon.

## 2020-09-30 DIAGNOSIS — E662 Morbid (severe) obesity with alveolar hypoventilation: Secondary | ICD-10-CM

## 2020-09-30 DIAGNOSIS — G9341 Metabolic encephalopathy: Secondary | ICD-10-CM | POA: Diagnosis not present

## 2020-09-30 DIAGNOSIS — T40601A Poisoning by unspecified narcotics, accidental (unintentional), initial encounter: Secondary | ICD-10-CM | POA: Diagnosis not present

## 2020-09-30 DIAGNOSIS — N179 Acute kidney failure, unspecified: Secondary | ICD-10-CM | POA: Diagnosis not present

## 2020-09-30 LAB — GLUCOSE, CAPILLARY
Glucose-Capillary: 179 mg/dL — ABNORMAL HIGH (ref 70–99)
Glucose-Capillary: 180 mg/dL — ABNORMAL HIGH (ref 70–99)
Glucose-Capillary: 188 mg/dL — ABNORMAL HIGH (ref 70–99)
Glucose-Capillary: 191 mg/dL — ABNORMAL HIGH (ref 70–99)
Glucose-Capillary: 206 mg/dL — ABNORMAL HIGH (ref 70–99)
Glucose-Capillary: 225 mg/dL — ABNORMAL HIGH (ref 70–99)
Glucose-Capillary: 247 mg/dL — ABNORMAL HIGH (ref 70–99)

## 2020-09-30 LAB — CBC
HCT: 32.4 % — ABNORMAL LOW (ref 36.0–46.0)
Hemoglobin: 10.7 g/dL — ABNORMAL LOW (ref 12.0–15.0)
MCH: 29.5 pg (ref 26.0–34.0)
MCHC: 33 g/dL (ref 30.0–36.0)
MCV: 89.3 fL (ref 80.0–100.0)
Platelets: 194 10*3/uL (ref 150–400)
RBC: 3.63 MIL/uL — ABNORMAL LOW (ref 3.87–5.11)
RDW: 14.8 % (ref 11.5–15.5)
WBC: 8.8 10*3/uL (ref 4.0–10.5)
nRBC: 0 % (ref 0.0–0.2)

## 2020-09-30 LAB — BASIC METABOLIC PANEL
Anion gap: 12 (ref 5–15)
BUN: 34 mg/dL — ABNORMAL HIGH (ref 8–23)
CO2: 29 mmol/L (ref 22–32)
Calcium: 9.6 mg/dL (ref 8.9–10.3)
Chloride: 95 mmol/L — ABNORMAL LOW (ref 98–111)
Creatinine, Ser: 1.43 mg/dL — ABNORMAL HIGH (ref 0.44–1.00)
GFR, Estimated: 41 mL/min — ABNORMAL LOW (ref >60)
Glucose, Bld: 220 mg/dL — ABNORMAL HIGH (ref 70–99)
Potassium: 3.5 mmol/L (ref 3.5–5.1)
Sodium: 136 mmol/L (ref 135–145)

## 2020-09-30 LAB — BRAIN NATRIURETIC PEPTIDE: B Natriuretic Peptide: 82.9 pg/mL (ref 0.0–100.0)

## 2020-09-30 LAB — COMPREHENSIVE METABOLIC PANEL
ALT: 20 U/L (ref 0–44)
AST: 20 U/L (ref 15–41)
Albumin: 3.5 g/dL (ref 3.5–5.0)
Alkaline Phosphatase: 76 U/L (ref 38–126)
Anion gap: 12 (ref 5–15)
BUN: 41 mg/dL — ABNORMAL HIGH (ref 8–23)
CO2: 29 mmol/L (ref 22–32)
Calcium: 9.4 mg/dL (ref 8.9–10.3)
Chloride: 93 mmol/L — ABNORMAL LOW (ref 98–111)
Creatinine, Ser: 1.53 mg/dL — ABNORMAL HIGH (ref 0.44–1.00)
GFR, Estimated: 38 mL/min — ABNORMAL LOW (ref >60)
Glucose, Bld: 221 mg/dL — ABNORMAL HIGH (ref 70–99)
Potassium: 3 mmol/L — ABNORMAL LOW (ref 3.5–5.1)
Sodium: 134 mmol/L — ABNORMAL LOW (ref 135–145)
Total Bilirubin: 0.6 mg/dL (ref 0.3–1.2)
Total Protein: 6.3 g/dL — ABNORMAL LOW (ref 6.5–8.1)

## 2020-09-30 LAB — POCT I-STAT 7, (LYTES, BLD GAS, ICA,H+H)
Acid-Base Excess: 7 mmol/L — ABNORMAL HIGH (ref 0.0–2.0)
Bicarbonate: 31.2 mmol/L — ABNORMAL HIGH (ref 20.0–28.0)
Calcium, Ion: 1.21 mmol/L (ref 1.15–1.40)
HCT: 32 % — ABNORMAL LOW (ref 36.0–46.0)
Hemoglobin: 10.9 g/dL — ABNORMAL LOW (ref 12.0–15.0)
O2 Saturation: 93 %
Patient temperature: 98.6
Potassium: 3 mmol/L — ABNORMAL LOW (ref 3.5–5.1)
Sodium: 134 mmol/L — ABNORMAL LOW (ref 135–145)
TCO2: 32 mmol/L (ref 22–32)
pCO2 arterial: 42.2 mmHg (ref 32.0–48.0)
pH, Arterial: 7.477 — ABNORMAL HIGH (ref 7.350–7.450)
pO2, Arterial: 63 mmHg — ABNORMAL LOW (ref 83.0–108.0)

## 2020-09-30 LAB — RESP PANEL BY RT-PCR (FLU A&B, COVID) ARPGX2
Influenza A by PCR: NEGATIVE
Influenza B by PCR: NEGATIVE
SARS Coronavirus 2 by RT PCR: NEGATIVE

## 2020-09-30 LAB — PROCALCITONIN: Procalcitonin: 0.41 ng/mL

## 2020-09-30 LAB — HEMOGLOBIN A1C
Hgb A1c MFr Bld: 9.4 % — ABNORMAL HIGH (ref 4.8–5.6)
Mean Plasma Glucose: 223.08 mg/dL

## 2020-09-30 LAB — TSH: TSH: 3.752 u[IU]/mL (ref 0.350–4.500)

## 2020-09-30 LAB — T4, FREE: Free T4: 0.76 ng/dL (ref 0.61–1.12)

## 2020-09-30 LAB — AMYLASE: Amylase: 49 U/L (ref 28–100)

## 2020-09-30 LAB — APTT: aPTT: 33 seconds (ref 24–36)

## 2020-09-30 LAB — ACETAMINOPHEN LEVEL: Acetaminophen (Tylenol), Serum: <10 ug/mL — ABNORMAL LOW (ref 10–30)

## 2020-09-30 LAB — PROTIME-INR
INR: 1.1 (ref 0.8–1.2)
Prothrombin Time: 13.9 seconds (ref 11.4–15.2)

## 2020-09-30 LAB — LIPASE, BLOOD: Lipase: 35 U/L (ref 11–51)

## 2020-09-30 LAB — CORTISOL: Cortisol, Plasma: 21.5 ug/dL

## 2020-09-30 LAB — MAGNESIUM: Magnesium: 2.1 mg/dL (ref 1.7–2.4)

## 2020-09-30 LAB — HIV ANTIBODY (ROUTINE TESTING W REFLEX): HIV Screen 4th Generation wRfx: NONREACTIVE

## 2020-09-30 LAB — LACTIC ACID, PLASMA: Lactic Acid, Venous: 1.9 mmol/L (ref 0.5–1.9)

## 2020-09-30 LAB — TROPONIN I (HIGH SENSITIVITY): Troponin I (High Sensitivity): 7 ng/L (ref <18)

## 2020-09-30 LAB — PHOSPHORUS: Phosphorus: 3 mg/dL (ref 2.5–4.6)

## 2020-09-30 MED ORDER — ACETAMINOPHEN 325 MG PO TABS
650.0000 mg | ORAL_TABLET | Freq: Four times a day (QID) | ORAL | Status: DC | PRN
  Administered 2020-09-30 – 2020-10-05 (×8): 650 mg via ORAL
  Filled 2020-09-30 (×8): qty 2

## 2020-09-30 MED ORDER — POTASSIUM CHLORIDE 10 MEQ/100ML IV SOLN
10.0000 meq | INTRAVENOUS | Status: AC
Start: 2020-09-30 — End: 2020-09-30
  Administered 2020-09-30 (×4): 10 meq via INTRAVENOUS
  Filled 2020-09-30: qty 100

## 2020-09-30 MED ORDER — CHLORHEXIDINE GLUCONATE CLOTH 2 % EX PADS
6.0000 | MEDICATED_PAD | Freq: Every day | CUTANEOUS | Status: DC
  Administered 2020-09-30 – 2020-10-06 (×7): 6 via TOPICAL

## 2020-09-30 MED ORDER — LORAZEPAM 0.5 MG PO TABS
0.5000 mg | ORAL_TABLET | Freq: Three times a day (TID) | ORAL | Status: DC | PRN
  Administered 2020-09-30 – 2020-10-05 (×2): 0.5 mg via ORAL
  Filled 2020-09-30 (×2): qty 1

## 2020-09-30 MED ORDER — CHLORHEXIDINE GLUCONATE CLOTH 2 % EX PADS
6.0000 | MEDICATED_PAD | Freq: Every day | CUTANEOUS | Status: DC
  Administered 2020-09-30: 6 via TOPICAL

## 2020-09-30 MED ORDER — HEPARIN SODIUM (PORCINE) 5000 UNIT/ML IJ SOLN
5000.0000 [IU] | Freq: Three times a day (TID) | INTRAMUSCULAR | Status: DC
  Administered 2020-09-30 – 2020-10-06 (×18): 5000 [IU] via SUBCUTANEOUS
  Filled 2020-09-30 (×18): qty 1

## 2020-09-30 MED ORDER — POTASSIUM CHLORIDE 20 MEQ PO PACK
40.0000 meq | PACK | Freq: Two times a day (BID) | ORAL | Status: AC
  Administered 2020-09-30 (×2): 40 meq via ORAL
  Filled 2020-09-30 (×2): qty 2
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
## 2020-09-30 NOTE — Progress Notes (Signed)
[facility] Progress Note Patient Name: [name] DOB: [birth year] MRN: [id number]   Date of Service  09/30/2020  HPI/Events of Note  Low potassium 3.0  eICU Interventions  40 meq IV and 40 meq po. Oral dose ordered x 2      Intervention Category Major Interventions: Electrolyte abnormality - evaluation and management  Mauri [place], P 09/30/2020, 6:46 AM

## 2020-10-01 ENCOUNTER — Encounter (HOSPITAL_COMMUNITY): Payer: Self-pay | Admitting: Internal Medicine

## 2020-10-01 DIAGNOSIS — G9341 Metabolic encephalopathy: Secondary | ICD-10-CM | POA: Diagnosis not present

## 2020-10-01 LAB — CBC
HCT: 31.2 % — ABNORMAL LOW (ref 36.0–46.0)
Hemoglobin: 10.2 g/dL — ABNORMAL LOW (ref 12.0–15.0)
MCH: 29.1 pg (ref 26.0–34.0)
MCHC: 32.7 g/dL (ref 30.0–36.0)
MCV: 89.1 fL (ref 80.0–100.0)
Platelets: 199 10*3/uL (ref 150–400)
RBC: 3.5 MIL/uL — ABNORMAL LOW (ref 3.87–5.11)
RDW: 15 % (ref 11.5–15.5)
WBC: 7.8 10*3/uL (ref 4.0–10.5)
nRBC: 0 % (ref 0.0–0.2)

## 2020-10-01 LAB — BASIC METABOLIC PANEL
Anion gap: 9 (ref 5–15)
BUN: 29 mg/dL — ABNORMAL HIGH (ref 8–23)
CO2: 29 mmol/L (ref 22–32)
Calcium: 9.3 mg/dL (ref 8.9–10.3)
Chloride: 97 mmol/L — ABNORMAL LOW (ref 98–111)
Creatinine, Ser: 1.43 mg/dL — ABNORMAL HIGH (ref 0.44–1.00)
GFR, Estimated: 41 mL/min — ABNORMAL LOW (ref >60)
Glucose, Bld: 188 mg/dL — ABNORMAL HIGH (ref 70–99)
Potassium: 3.5 mmol/L (ref 3.5–5.1)
Sodium: 135 mmol/L (ref 135–145)

## 2020-10-01 LAB — GLUCOSE, CAPILLARY
Glucose-Capillary: 181 mg/dL — ABNORMAL HIGH (ref 70–99)
Glucose-Capillary: 182 mg/dL — ABNORMAL HIGH (ref 70–99)
Glucose-Capillary: 252 mg/dL — ABNORMAL HIGH (ref 70–99)
Glucose-Capillary: 233 mg/dL — ABNORMAL HIGH (ref 70–99)
Glucose-Capillary: 234 mg/dL — ABNORMAL HIGH (ref 70–99)

## 2020-10-01 MED ORDER — INSULIN GLARGINE-YFGN 100 UNIT/ML ~~LOC~~ SOLN
5.0000 [IU] | Freq: Every day | SUBCUTANEOUS | Status: DC
  Administered 2020-10-01 – 2020-10-06 (×6): 5 [IU] via SUBCUTANEOUS
  Filled 2020-10-01 (×6): qty 0.05

## 2020-10-01 MED ORDER — PANTOPRAZOLE SODIUM 40 MG PO TBEC
40.0000 mg | DELAYED_RELEASE_TABLET | Freq: Two times a day (BID) | ORAL | Status: DC
  Administered 2020-10-01 – 2020-10-06 (×10): 40 mg via ORAL
  Filled 2020-10-01 (×10): qty 1

## 2020-10-01 MED ORDER — SODIUM CHLORIDE 0.9% FLUSH
10.0000 mL | INTRAVENOUS | Status: DC | PRN
  Administered 2020-10-05: 10 mL

## 2020-10-01 MED ORDER — LINACLOTIDE 145 MCG PO CAPS
290.0000 ug | ORAL_CAPSULE | Freq: Every day | ORAL | Status: DC
  Administered 2020-10-02 – 2020-10-06 (×5): 290 ug via ORAL
  Filled 2020-10-01 (×6): qty 2

## 2020-10-01 MED ORDER — ZIPRASIDONE HCL 40 MG PO CAPS
40.0000 mg | ORAL_CAPSULE | Freq: Every day | ORAL | Status: DC
  Administered 2020-10-01 – 2020-10-06 (×6): 40 mg via ORAL
  Filled 2020-10-01 (×6): qty 1

## 2020-10-01 MED ORDER — SODIUM CHLORIDE 0.9% FLUSH
10.0000 mL | Freq: Two times a day (BID) | INTRAVENOUS | Status: DC
  Administered 2020-10-01 (×2): 10 mL
  Administered 2020-10-02: 30 mL
  Administered 2020-10-02 – 2020-10-06 (×7): 10 mL

## 2020-10-01 MED ORDER — VENLAFAXINE HCL ER 150 MG PO CP24
150.0000 mg | ORAL_CAPSULE | Freq: Two times a day (BID) | ORAL | Status: DC
  Administered 2020-10-01 – 2020-10-06 (×10): 150 mg via ORAL
  Filled 2020-10-01 (×13): qty 1

## 2020-10-01 MED ORDER — METHOCARBAMOL 500 MG PO TABS
500.0000 mg | ORAL_TABLET | Freq: Three times a day (TID) | ORAL | Status: DC
  Administered 2020-10-01 – 2020-10-06 (×15): 500 mg via ORAL
  Filled 2020-10-01 (×15): qty 1

## 2020-10-01 MED ORDER — POTASSIUM CHLORIDE CRYS ER 20 MEQ PO TBCR
40.0000 meq | EXTENDED_RELEASE_TABLET | Freq: Once | ORAL | Status: AC
  Administered 2020-10-01: 40 meq via ORAL
  Filled 2020-10-01: qty 2

## 2020-10-01 MED ORDER — OXYCODONE HCL 5 MG PO TABS
5.0000 mg | ORAL_TABLET | Freq: Four times a day (QID) | ORAL | Status: DC | PRN
  Administered 2020-10-01 – 2020-10-06 (×14): 5 mg via ORAL
  Filled 2020-10-01 (×14): qty 1

## 2020-10-01 NOTE — Progress Notes (Signed)
K+ 3.5  Replaced per protocol

## 2020-10-01 NOTE — Progress Notes (Signed)
Patient stated that she can't tolerate the CPAP.  Patient  states that she uses nasal pillows at home.

## 2020-10-01 NOTE — Progress Notes (Signed)
NAME:  [name], MRN:  [id number], DOB:  [birth year], LOS: 2 ADMISSION DATE:  09/29/2020, CONSULTATION DATE: 09/29/2020 REFERRING MD: Emergency Department physician, CHIEF COMPLAINT: Altered mental status  History of Present Illness:  65 year old female with a plethora of health issues that are well documented below.  She is seen by healthcare worker 09/29/2020 in her wheelchair but poorly responsive.  EMS was activated she is transported to [facility] emergency department where she is evaluated by the emergency department physician.  She was given Narcan in route and twice more in the emergency room with improvement in respirations from 4-10.  Arterial blood gases are adequate therefore she will be admitted to intensive care without being intubated at this time. Weanable drug screen her for possible overdose.  Continue Narcan 2 mg an hour drip.  CT of the head to rule out some type of organic dysfunction. She may need intubation in the near future.  Pertinent  Medical History   Past Medical History:  Diagnosis Date   Anemia    Anxiety    Arthritis    "pretty much all my joints"   Asthma    Benign paroxysmal positional vertigo 12/30/2012   Breast cancer ([place]) 02/13/12   ruq  100'clock bx Ductal Carcinoma in Situ,(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   Breast mass in female    L breast 2008, US showed likely fat necrosis   Chronic lower back pain    CKD (chronic kidney disease), stage III ([place])    "lower stage" (01/06/2014)   Daily headache    last time was Sep 02, 2016 (brother passed away)   Depression    Dyspnea    Gait abnormality 05/29/2016   GERD (gastroesophageal reflux disease)    History of blood transfusion 2011   "after one of my OR's"   History of hiatal hernia    History of stomach ulcers    HSV (herpes simplex virus) infection    Hx of radiation therapy 05/05/12- 07/15/12   right breast, 45 gray x 25 fx, lumpectomy cavity boosted to 16.2 gray   Hyperlipemia    Hypertension     sees Dr. 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 No complaints this AM. Concerned about pain management regimen after overdose. Wanting to stay in patient to work on safe regimen.   Objective   Blood pressure 139/85, pulse 95, temperature 98.5 F (36.9 C), temperature source Oral, resp. rate (!) 22, weight (!) 167.4 kg, SpO2 97 %.        Intake/Output Summary (Last 24 hours) at 10/01/2020  [place] filed at 10/01/2020 0500 Gross per 24 hour  Intake 2642.75 ml  Output 1950 ml  Net 692.75 ml   Filed Weights   09/30/20 1400 10/01/20 0400  Weight: (!) 169.2 kg (!) 167.4 kg    Examination: General: obese woman, lyin gin bed in NAD HENT: [place], atraumatic, moist mucus membranes Lungs: clear to auscultation bilaterally, no increased work of breathing  Cardiovascular: RRR, no M/R/G though heart sounds distant and difficult to appreciate due to body habitus  Abdomen: soft, non tender, non distended, bowel sounds present and normal  Extremities: no edema noted, warm, well perfused Neuro: awake, alert and oriented x4, moving all extremities with purpose   Resolved Hospital Problem list   Acute metabolic encephalopathy   Hyponatremia  Hypokalemia AKI   Assessment & Plan:  Acute metabolic encephalopathy in the setting of presumed hypercarbic respiratory failure with questionable drug interactions - resolved Known obesity-hypoventilation syndrome and OSA and COPD  Narcan infusion d/c'd yesterday. Discussed with patient d/c home v transition to floor to work on pain management regimen and patient feels safer staying and working on safer pain management regimen.  -increase mobility - PT/OT -nocturnal CPAP at home settings 10cm H2O - transfer to floor  Chronic pain - restart home flexeril 10 mg at bedtime  - restart home robaxin 750 mg TID - restart home oxycodone at 5 mg Q6H Prn instead of 15 mg Q6H PRN; can up titrate as needed and able - home meds not restarted yet: lyrica, tizanidine  Known paranoid schizophrenia - resume home meds: geodon 60 mg daily, venlafaxine 150 mg BID -home benzo restarted yesterday as PRN   AKI on CKD 3b - resolved Creatinine back to baseline.  -Avoid nephrotoxic meds -renally dose meds -strict I/Os  Diabetes mellitus w/ uncontrolled hyperglycemia  -SSI PRN - strengthen insulin regimen: add basal dose -goal BG 140-180  Hyponatremia - revolved  Hypokalemia - resolved  Chronic anemia - stable -transfuse for Hb<7 or hemodynamically significant bleeding - trend CBC  Best Practice (right click and "Reselect all SmartList Selections" daily)   Diet/type: Regular consistency (see orders) DVT prophylaxis: prophylactic heparin  GI prophylaxis: PPI Lines: Central line and yes and it is still needed Foley:  N/A Code Status:  full code Last date of multidisciplinary goals of care discussion _0   Labs   CBC: Recent Labs  Lab 09/29/20 1441 09/29/20 1452 09/29/20 1453 09/30/20 0437 09/30/20 0447 10/01/20 0456  WBC 10.7*  --   --  8.8  --  7.8  NEUTROABS 8.2*  --   --   --   --   --   HGB 11.2* 11.6* 11.9*  11.9* 10.7* 10.9* 10.2*  HCT 34.7* 34.0* 35.0*   35.0* 32.4* 32.0* 31.2*  MCV 91.8  --   --  89.3  --  89.1  PLT 160  --   --  194  --  381    Basic Metabolic Panel: Recent Labs  Lab 09/29/20 1441 09/29/20 1452 09/29/20 1453 09/30/20 0437 09/30/20 0447 09/30/20 1215 10/01/20 0456  NA 129* 129* 129*  130* 134* 134* 136 135  K 2.8* 2.8* 2.9*  2.5* 3.0* 3.0* 3.5 3.5  CL 91* 92*  --  93*  --  95* 97*  CO2 24  --   --  29  --  29 29  GLUCOSE 229* 236*  --  221*  --  220* 188*  BUN 52* 53*  --  41*  --  34* 29*  CREATININE 2.01* 2.10*  --  1.53*  --  1.43* 1.43*  CALCIUM 9.6  --   --  9.4  --  9.6 9.3  MG 2.2  --   --  2.1  --   --   --   PHOS  --   --   --  3.0  --   --   --    GFR: Estimated Creatinine Clearance: 63.5 mL/min (A) (by C-G formula based on SCr of 1.43 mg/dL (H)). Recent Labs  Lab 09/29/20 1441 09/30/20 0437 10/01/20 0456  PROCALCITON  --  0.41  --   WBC 10.7* 8.8 7.8  LATICACIDVEN 1.6 1.9  --

## 2020-10-01 NOTE — Progress Notes (Signed)
Inpatient Diabetes Program Recommendations  AACE/ADA: New Consensus Statement on Inpatient Glycemic Control   Target Ranges:  Prepandial:   less than 140 mg/dL      Peak postprandial:   less than 180 mg/dL (1-2 hours)      Critically ill patients:  140 - 180 mg/dL   Results for RICKEYA, MANUS (MRN [id number]) as of 10/01/2020 12:24  Ref. Range 09/30/2020 07:46 09/30/2020 11:17 09/30/2020 15:28 09/30/2020 20:01 09/30/2020 23:40 10/01/2020 04:39 10/01/2020 06:45 10/01/2020 11:31  Glucose-Capillary Latest Ref Range: 70 - 99 mg/dL 206 (H) 225 (H) 247 (H) 191 (H) 188 (H) 181 (H) 182 (H) 252 (H)    Review of Glycemic Control  Diabetes history: DM2 Outpatient Diabetes medications: NPH 34 units QAM, Trulicity 4.5 mg Qweek Current orders for Inpatient glycemic control: Semglee 5 units daily, Novolog 0-20 units Q4H  Inpatient Diabetes Program Recommendations:    Insulin: Please consider increasing Semglee to 20 units daily.  Diet: Please discontinue Regular diet and order Carb Modified diet.  Thanks, Barnie Alderman, RN, MSN, CDE Diabetes Coordinator Inpatient Diabetes Program [phone] (Team Pager from 8am to 5pm)

## 2020-10-01 NOTE — Plan of Care (Signed)
  Problem: Safety: Goal: Ability to remain free from injury will improve Outcome: Progressing   Problem: Skin Integrity: Goal: Risk for impaired skin integrity will decrease Outcome: Progressing   Problem: Education: Goal: Knowledge of General Education information will improve Description: Including pain rating scale, medication(s)/side effects and non-pharmacologic comfort measures Outcome: Progressing

## 2020-10-02 ENCOUNTER — Inpatient Hospital Stay (HOSPITAL_COMMUNITY): Payer: Medicare Other

## 2020-10-02 DIAGNOSIS — M25512 Pain in left shoulder: Secondary | ICD-10-CM

## 2020-10-02 DIAGNOSIS — G9340 Encephalopathy, unspecified: Secondary | ICD-10-CM

## 2020-10-02 DIAGNOSIS — K219 Gastro-esophageal reflux disease without esophagitis: Secondary | ICD-10-CM

## 2020-10-02 LAB — BASIC METABOLIC PANEL
Anion gap: 9 (ref 5–15)
BUN: 24 mg/dL — ABNORMAL HIGH (ref 8–23)
CO2: 29 mmol/L (ref 22–32)
Calcium: 9.4 mg/dL (ref 8.9–10.3)
Chloride: 101 mmol/L (ref 98–111)
Creatinine, Ser: 1.24 mg/dL — ABNORMAL HIGH (ref 0.44–1.00)
GFR, Estimated: 49 mL/min — ABNORMAL LOW (ref >60)
Glucose, Bld: 172 mg/dL — ABNORMAL HIGH (ref 70–99)
Potassium: 3.6 mmol/L (ref 3.5–5.1)
Sodium: 139 mmol/L (ref 135–145)

## 2020-10-02 LAB — CBC WITH DIFFERENTIAL/PLATELET
Abs Immature Granulocytes: 0.1 10*3/uL — ABNORMAL HIGH (ref 0.00–0.07)
Basophils Absolute: 0.1 10*3/uL (ref 0.0–0.1)
Basophils Relative: 1 %
Eosinophils Absolute: 0.1 10*3/uL (ref 0.0–0.5)
Eosinophils Relative: 1 %
HCT: 32.7 % — ABNORMAL LOW (ref 36.0–46.0)
Hemoglobin: 10.6 g/dL — ABNORMAL LOW (ref 12.0–15.0)
Immature Granulocytes: 1 %
Lymphocytes Relative: 16 %
Lymphs Abs: 1.4 10*3/uL (ref 0.7–4.0)
MCH: 29.8 pg (ref 26.0–34.0)
MCHC: 32.4 g/dL (ref 30.0–36.0)
MCV: 91.9 fL (ref 80.0–100.0)
Monocytes Absolute: 0.7 10*3/uL (ref 0.1–1.0)
Monocytes Relative: 7 %
Neutro Abs: 6.8 10*3/uL (ref 1.7–7.7)
Neutrophils Relative %: 74 %
Platelets: 192 10*3/uL (ref 150–400)
RBC: 3.56 MIL/uL — ABNORMAL LOW (ref 3.87–5.11)
RDW: 14.9 % (ref 11.5–15.5)
WBC: 9.2 10*3/uL (ref 4.0–10.5)
nRBC: 0 % (ref 0.0–0.2)

## 2020-10-02 LAB — GLUCOSE, CAPILLARY
Glucose-Capillary: 144 mg/dL — ABNORMAL HIGH (ref 70–99)
Glucose-Capillary: 165 mg/dL — ABNORMAL HIGH (ref 70–99)
Glucose-Capillary: 166 mg/dL — ABNORMAL HIGH (ref 70–99)
Glucose-Capillary: 171 mg/dL — ABNORMAL HIGH (ref 70–99)
Glucose-Capillary: 171 mg/dL — ABNORMAL HIGH (ref 70–99)
Glucose-Capillary: 189 mg/dL — ABNORMAL HIGH (ref 70–99)
Glucose-Capillary: 191 mg/dL — ABNORMAL HIGH (ref 70–99)
Glucose-Capillary: 214 mg/dL — ABNORMAL HIGH (ref 70–99)
Glucose-Capillary: 282 mg/dL — ABNORMAL HIGH (ref 70–99)

## 2020-10-02 MED ORDER — OXYMETAZOLINE HCL 0.05 % NA SOLN
1.0000 | Freq: Two times a day (BID) | NASAL | Status: DC | PRN
  Filled 2020-10-02 (×2): qty 30

## 2020-10-02 MED ORDER — OXYMETAZOLINE HCL 0.05 % NA SOLN: 1.0000 | Freq: Two times a day (BID) | NASAL | Status: DC

## 2020-10-02 MED ORDER — DICLOFENAC SODIUM 1 % EX GEL
2.0000 g | Freq: Four times a day (QID) | CUTANEOUS | Status: DC
  Administered 2020-10-03 – 2020-10-06 (×13): 2 g via TOPICAL
  Filled 2020-10-02: qty 100

## 2020-10-02 NOTE — Evaluation (Signed)
Physical Therapy Evaluation Patient Details Name: [name] MRN: [id number] DOB: February 15, 1956 Today's Date: 10/02/2020   History of Present Illness  64 yo woman transported to ED on 8/13 by EMS due to decr responsiveness; overdose in pain meds, Narcan given;   has a past medical history including, but not limited to Anemia, Anxiety, Benign paroxysmal positional vertigo (12/30/2012), Breast cancer ([place]) (02/13/12),  CKD (chronic kidney disease), stage III ([place]),  Gait abnormality (05/29/2016),  HSV (herpes simplex virus) infection, radiation therapy (05/05/12- 07/15/12), Knee pain, bilateral, Memory difficulty (12/22/2017), OSA on CPAP, Pancreatitis, Pneumonia (1950's), Polyneuropathy in diabetes(357.2), Schizophrenia ([place]), Secondary parkinsonism ([place]) (12/30/2012), Tachycardia, Thyroid disease, and Type II diabetes mellitus ([place]).  Clinical Impression   Pt admitted with above diagnosis. Comes from home, where she lives alone; prior to admission, patient independent using w/c for mobility and ambulating into restroom, aide 2-3 hours a day assisting with ADLs and IADLs; Presents to PT with R ankle pain and stability limiting her weight bearing on RLE, and therefore limiting her standing and transfer tolerance; Needs min assist for bed mobility, not at her baseline of room amb;  Pt currently with functional limitations due to the deficits listed below (see PT Problem List). Pt will benefit from skilled PT to increase their independence and safety with mobility to allow discharge to the venue listed below.       Follow Up Recommendations SNF; Pt requests Occupational psychologist cushion (measurements PT)    Recommendations for Other Services       Precautions / Restrictions Precautions Precautions: Fall Restrictions Weight Bearing Restrictions: No      Mobility  Bed Mobility Overal bed mobility: Needs Assistance Bed Mobility: Supine to Sit     Supine to  sit: Min assist     General bed mobility comments: Min handheld assist to pulll to sit; cues to scoot to EOB    Transfers Overall transfer level: Needs assistance Equipment used: 1 person hand held assist (and second person present for safety) Transfers: Sit to/from [facility] Sit to Stand: Min guard Stand pivot transfers: Min guard       General transfer comment: Dependent on UE suport to lean forward and stand; heavily dependent on UEs with pivot to [facility]; noting pt keeping weight off of RLE, and mostly pivoting on L foot to turn from [facility] to reclienr  Ambulation/Gait                Stairs            Wheelchair Mobility    Modified Rankin (Stroke Patients Only)       Balance Overall balance assessment: Needs assistance Sitting-balance support: Feet supported;No upper extremity supported Sitting balance-Leahy Scale: Fair     Standing balance support: Bilateral upper extremity supported;During functional activity Standing balance-Leahy Scale: Poor Standing balance comment: relies on BUE support                             Pertinent Vitals/Pain Pain Assessment: 0-10 Pain Score: 8  Pain Location: back Pain Descriptors / Indicators: Discomfort;Grimacing;Guarding Pain Intervention(s): Limited activity within patient's tolerance    Home Living Family/patient expects to be discharged to:: Private residence Living Arrangements: Alone Available Help at Discharge: Personal care attendant (aide 2-3 hrs/day) Type of Home: House Home Access: Ramped entrance     Home Layout: One level Home Equipment: Wheelchair - manual;Grab bars - toilet;Toilet  riser;Shower seat      Prior Function Level of Independence: Needs assistance   Gait / Transfers Assistance Needed: uses wc for mobility, ambulates into bathroom  ADL's / Homemaking Assistance Needed: aide assists with bathing, dressing, and IADLs; manages toileting with  independeence  Comments: aide 2 1/2 hrs daily     Hand Dominance   Dominant Hand: Right    Extremity/Trunk Assessment   Upper Extremity Assessment Upper Extremity Assessment: Defer to OT evaluation    Lower Extremity Assessment Lower Extremity Assessment: Generalized weakness;RLE deficits/detail (AROM bil LEs simited due to body habitus) RLE Deficits / Details: Pt with R ankle pain and instability; tells PT/OT that her R ankle tends to "roll"; minimizes weight on her R ankle due to pain and instability    Cervical / Trunk Assessment Cervical / Trunk Assessment: Other exceptions Cervical / Trunk Exceptions: increased body habitus  Communication   Communication: No difficulties  Cognition Arousal/Alertness: Awake/alert Behavior During Therapy: WFL for tasks assessed/performed Overall Cognitive Status: Within Functional Limits for tasks assessed                                        General Comments General comments (skin integrity, edema, etc.): VSS on RA    Exercises     Assessment/Plan    PT Assessment Patient needs continued PT services  PT Problem List Decreased strength;Decreased range of motion;Decreased activity tolerance;Decreased balance;Decreased mobility;Decreased coordination;Decreased knowledge of use of DME;Decreased safety awareness;Decreased knowledge of precautions;Pain;Obesity       PT Treatment Interventions DME instruction;Gait training;Functional mobility training;Therapeutic activities;Therapeutic exercise;Patient/family education;Wheelchair mobility training    PT Goals (Current goals can be found in the Care Plan section)  Acute Rehab PT Goals Patient Stated Goal: to get stronger at rehab PT Goal Formulation: With patient Time For Goal Achievement: 10/16/20 Potential to Achieve Goals: Good    Frequency Min 2X/week   Barriers to discharge        Co-evaluation PT/OT/SLP Co-Evaluation/Treatment: Yes Reason for  Co-Treatment: For patient/therapist safety;To address functional/ADL transfers PT goals addressed during session: Mobility/safety with mobility OT goals addressed during session: ADL's and self-care       AM-PAC PT "6 Clicks" Mobility  Outcome Measure Help needed turning from your back to your side while in a flat bed without using bedrails?: A Little Help needed moving from lying on your back to sitting on the side of a flat bed without using bedrails?: A Little Help needed moving to and from a bed to a chair (including a wheelchair)?: A Little Help needed standing up from a chair using your arms (e.g., wheelchair or bedside chair)?: A Little Help needed to walk in hospital room?: A Lot Help needed climbing 3-5 steps with a railing? : Total 6 Click Score: 15    End of Session   Activity Tolerance: Patient tolerated treatment well;Patient limited by pain Patient left: in chair;with call bell/phone within reach Nurse Communication: Mobility status PT Visit Diagnosis: Unsteadiness on feet (R26.81);Other abnormalities of gait and mobility (R26.89);Pain Pain - Right/Left: Right Pain - part of body: Ankle and joints of foot (and back pain at midline)    Time: 1230-1306 PT Time Calculation (min) (ACUTE ONLY): 36 min   Charges:   PT Evaluation $PT Eval Moderate Complexity: [place], [place]  [place] Pager [phone] Office [phone]  [place] 10/02/2020, 2:16 PM

## 2020-10-02 NOTE — NC FL2 (Signed)
[place] LEVEL OF CARE SCREENING TOOL     IDENTIFICATION  Patient Name: [name] Birthdate: [birth year] Sex: female Admission Date (Current Location): 09/29/2020  [facility] and [place] Number:  Herbalist and Address:  [facility], [place] [address] [place], [place] [address]      Provider Number: [id number]  Attending Physician Name and Address:  Kerney Elbe, DO  Relative Name and Phone Number:  [name] [phone]    Current Level of Care: Hospital Recommended Level of Care: [place] Prior Approval Number:    Date Approved/Denied:   PASRR Number: [id number]  Discharge Plan: SNF    Current Diagnoses: Patient Active Problem List   Diagnosis Date Noted   AMS (altered mental status) 09/29/2020   Hyperparathyroidism, secondary ([place]) 03/23/2018   Memory difficulty 12/22/2017   Spinal stenosis 06/09/2017   HNP (herniated nucleus pulposus), lumbar 12/05/2016   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   Diabetic neuropathy ([place]) 09/08/2016   Hypocalcemia 08/22/2016   Abnormal barium swallow 07/07/2016   Anxiety 07/07/2016   History of malignant neoplasm of breast 07/07/2016   Gait abnormality 05/29/2016   Encephalopathy acute 04/23/2016   Hyponatremia 04/23/2016   Schizophrenia, paranoid ([place])    Aspiration pneumonia of left lower lobe due to gastric secretions (HCC)    Projectile vomiting with nausea    Altered mental status    Schizophrenia ([place])    Disorganized schizophrenia ([place])    Chronic pain syndrome    Acute renal failure (HCC)    Controlled diabetes mellitus type 2 with complications (HCC)    Acute encephalopathy    Oropharyngeal dysphagia    Aspiration pneumonia of right lower lobe due to vomit ([place])    Acute renal failure (ARF) ([place]) 03/28/2016   Encephalopathy 03/27/2016   HCAP (healthcare-associated pneumonia) 02/29/2016    Pneumonia 02/14/2016   CAP (community acquired pneumonia) 02/13/2016   Edema 02/13/2016   AKI (acute kidney injury) ([place]) 02/13/2016   Elevated troponin 06/24/2015   Diarrhea 06/23/2015   Sepsis ([place]) 06/23/2015   Obesity, morbid, BMI 50 or higher ([place]) 03/26/2015   Post traumatic stress disorder (PTSD) 11/14/2014   Leukocytosis 11/12/2014   Slurred speech 11/12/2014   CKD (chronic kidney disease) stage 3, GFR 30-59 ml/min (HCC)    Diabetes mellitus, type II, insulin dependent ([place])    TIA (transient ischemic attack)    Hypotension 10/28/2014   Syncope 10/28/2014   Anemia 10/28/2014   Type II diabetes mellitus ([place]) 10/28/2014   OSA on CPAP 10/28/2014   Chronic lower back pain 10/28/2014   Polyneuropathy in diabetes(357.2) 10/28/2014   Diabetic polyneuropathy ([place])    Fall 08/14/2014   Recurrent falls 08/14/2014   Hot flashes 02/24/2014   Arthralgia 02/24/2014   Hair loss 02/24/2014   Chest pain 01/06/2014   Shock ([place]) 08/08/2013   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   Dysarthria 12/30/2012   Benign paroxysmal positional vertigo 12/30/2012   Breast cancer of upper-outer quadrant of right female breast ([place]) 11/18/2012   Hemorrhage of rectum and anus 09/28/2012   Hx of radiation therapy    Syncope and collapse 06/04/2011   HSV 10/05/2008   Mild persistent asthma 06/02/2008   Dyspnea 07/19/2007   HLD (hyperlipidemia) 06/02/2006   Sinus tachycardia 05/25/2006   Hypothyroidism 02/27/2006   Depression 02/27/2006   Essential hypertension 02/27/2006   GERD 02/27/2006   KNEE PAIN 02/27/2006    Orientation  RESPIRATION BLADDER Height & Weight     Self, Time, Situation, Place  Normal Continent, External catheter (External Urinary Catheter) Weight: (!) 373 lb 0.3 oz (169.2 kg) Height:  _0  (165.1 cm)  BEHAVIORAL SYMPTOMS/MOOD NEUROLOGICAL BOWEL NUTRITION STATUS      Continent Diet (Please see discharge summary)  AMBULATORY STATUS COMMUNICATION OF NEEDS Skin    Limited Assist Verbally Other (Comment) (cellulitis,leg medial)                       Personal Care Assistance Level of Assistance  Bathing, Feeding, Dressing Bathing Assistance: Limited assistance Feeding assistance: Independent (able to feed self) Dressing Assistance: Limited assistance     Functional Limitations Info  Sight, Hearing, Speech Sight Info: Impaired Hearing Info: Adequate Speech Info: Adequate    SPECIAL CARE FACTORS FREQUENCY  PT (By licensed PT), OT (By licensed OT)     PT Frequency: 5x min weekly OT Frequency: 5x min weekly            Contractures Contractures Info: Not present    Additional Factors Info  Code Status, Allergies, Insulin Sliding Scale, Psychotropic Code Status Info: FULL Allergies Info: Bee Venom,Invokana (canagliflozin),Shellfish Allergy,Buprenorphine Hcl,Other,Cats - bad asthma, stopped up Psychotropic Info: venlafaxine XR (EFFEXOR-XR) 24 hr capsule 150 mg 2 times daily Insulin Sliding Scale Info: insulin aspart (novoLOG) injection 0-20 Units Every 4 hours,insulin glargine-yfgn (SEMGLEE) injection 5 Units daily,       Current Medications (10/02/2020):  This is the current hospital active medication list Current Facility-Administered Medications  Medication Dose Route Frequency Provider Last Rate Last Admin   0.9 %  sodium chloride infusion   Intravenous PRN Spero Geralds, MD   Stopped at 09/30/20 1218   acetaminophen (TYLENOL) tablet 650 mg  650 mg Oral Q6H PRN Noemi Chapel P, DO   650 mg at 10/02/20 1230   albuterol (PROVENTIL) (2.5 MG/3ML) 0.083% nebulizer solution 2.5 mg  2.5 mg Nebulization Q4H PRN Spero Geralds, MD       aspirin EC tablet 81 mg  81 mg Oral Daily Spero Geralds, MD   81 mg at 10/02/20 3664   Chlorhexidine Gluconate Cloth 2 % PADS 6 each  6 each Topical Daily Renee Pain, MD   6 each at 10/02/20 0939   docusate sodium (COLACE) capsule 100 mg  100 mg Oral BID PRN Minor, Grace Bushy, NP       heparin  injection 5,000 Units  5,000 Units Subcutaneous Q8H Julian Hy, DO   5,000 Units at 10/02/20 4034   insulin aspart (novoLOG) injection 0-20 Units  0-20 Units Subcutaneous Q4H Spero Geralds, MD   7 Units at 10/02/20 1220   insulin glargine-yfgn [facility]) injection 5 Units  5 Units Subcutaneous Daily Candee Furbish, MD   5 Units at 10/02/20 0939   labetalol (NORMODYNE) injection 10 mg  10 mg Intravenous Q4H PRN Spero Geralds, MD       levothyroxine (SYNTHROID) tablet 224 mcg  224 mcg Oral Q0600 Spero Geralds, MD   224 mcg at 10/02/20 7425   linaclotide (LINZESS) capsule 290 mcg  290 mcg Oral QAC breakfast Candee Furbish, MD   290 mcg at 10/02/20 9563   LORazepam (ATIVAN) tablet 0.5 mg  0.5 mg Oral Q8H PRN Julian Hy, DO   0.5 mg at 09/30/20 1646   methocarbamol (ROBAXIN) tablet 500 mg  500 mg Oral TID Candee Furbish, MD   500 mg  at 10/02/20 0937   oxyCODONE (Oxy IR/ROXICODONE) immediate release tablet 5 mg  5 mg Oral Q6H PRN Candee Furbish, MD   5 mg at 10/02/20 0370   oxymetazoline (AFRIN) 0.05 % nasal spray 1 spray  1 spray Each Nare BID PRN Candee Furbish, MD       pantoprazole (PROTONIX) EC tablet 40 mg  40 mg Oral BID AC Candee Furbish, MD   40 mg at 10/02/20 4888   polyethylene glycol (MIRALAX / GLYCOLAX) packet 17 g  17 g Oral Daily PRN Minor, Grace Bushy, NP       rosuvastatin (CRESTOR) tablet 20 mg  20 mg Oral Daily Spero Geralds, MD   20 mg at 10/02/20 9169   sodium chloride flush (NS) 0.9 % injection 10-40 mL  10-40 mL Intracatheter Q12H Noemi Chapel P, DO   10 mL at 10/02/20 0939   sodium chloride flush (NS) 0.9 % injection 10-40 mL  10-40 mL Intracatheter PRN Julian Hy, DO       venlafaxine XR (EFFEXOR-XR) 24 hr capsule 150 mg  150 mg Oral BID Candee Furbish, MD   150 mg at 10/02/20 4503   ziprasidone (GEODON) capsule 40 mg  40 mg Oral Daily Candee Furbish, MD   40 mg at 10/02/20 8882     Discharge Medications: Please see discharge summary for a list of  discharge medications.  Relevant Imaging Results:  Relevant Lab Results:   Additional Information [id number]  Trula Ore, LCSWA

## 2020-10-02 NOTE — Progress Notes (Signed)
PROGRESS NOTE    [name]  YBO:[id number] DOB: December 26, 1955 DOA: 09/29/2020 PCP: Fredrich Romans, PA   Brief Narrative:  The patient is a 65 year old super morbidly obese African-American female with a plethora of health issues including anemia, anxiety, asthma, BPPV, history of breast cancer, chronic low back pain, depression, GERD, history of hiatal hernia and stomach ulcers, HSV infection, hypothyroidism, nephrolithiasis, OSA, history of polyneuropathy and secondary parkinsonism as well as type 2 diabetes mellitus and other comorbidities who presented after she was seen 09/29/2020 when her culture by a healthcare worker but was very poorly responsive.  EMS was activated and she was transferred to [facility] where she was evaluated by the ED physician she was given Narcan in route and twice during the ED.  She had improvement in her respirations from 4-10.  ABG was adequate but she was admitted to intensive care unit without being intubated.  Her narcotics were held and she was admitted for possible overdose.  She was continued on a Narcan drip and has now been titrated off.  CT of the head was done to rule out some type of organic dysfunction but subsequently she improved and became alert and oriented.  Her pain is still uncontrolled and she is complaining of left shoulder pain as she is fallen multiple times and has had EMS help her up.  She was admitted for acute metabolic encephalopathy in setting of presumed hypercarbic respiratory failure with questionable drug interactions and this is now improved.   Assessment & Plan:   Active Problems:   AMS (altered mental status)    Acute metabolic encephalopathy in the setting of presumed hypercarbic respiratory failure with questionable drug interactions - resolved Known obesity-hypoventilation syndrome and OSA and COPD  -Narcan infusion d/c'd yesterday.  The day before discussed with patient d/c home v transition to floor to work on pain  management regimen and patient feels safer staying and working on safer pain management regimen.  -increase mobility - PT/OT recommending SNF -nocturnal CPAP at home settings 10cm H2O -He was transferred to [facility] service on 10/03/2018 -Continue with delirium precautions   Chronic pain -He is reinitiated on home flexeril 10 mg at bedtime, as well as Robaxin-750 milligrams p.o. 3 times daily as well as oxycodone 5 mg every 6 as needed to 50 mg every 6 as needed. - home meds not restarted yet: lyrica, tizanidine and may not restart them considering how she does   Known paranoid schizophrenia/anxiety and depression -Resumed home meds: geodon 60 mg daily, venlafaxine 150 mg BID -home benzo restarted yesterday as PRN with lorazepam 0.5 mg every 8 hours as needed anxiety   AKI on CKD 3b - resolved and improved -Creatinine back to baseline.  -Patient's BUNs/creatinine went from 41/1.5 today is now trended down to 24/1.24 -Avoid nephrotoxic medications, contrast dyes, hypotension and renally dose medications -strict I/Os -Continue monitor and trend renal function carefully and repeat BMP   Diabetes mellitus w/ uncontrolled hyperglycemia  -SSI PRN - strengthen insulin regimen: add basal dose and is now been started on Semglee 5 units subcu daily and continuing on a resistant NovoLog sign scale insulin every 4 hours but will likely change the before meals and at bedtime in the morning -goal BG 140-180   Hyponatremia - revolved as last sodium was 139   Hypokalemia - resolved and last potassium was 3.6 but will give a dose of 40 mg today   Chronic normocytic anemia - stable -transfuse for Hb<7 or hemodynamically significant bleeding -  Patient's hemoglobin/hematocrit went from 10.9/37.0 and trended down to 10.2/31.2 and improved to 10.6/32.7 -Check anemia panel in a.m. -Continue monitor for signs and symptoms of bleeding; currently no overt bleeding noted -Repeat CBC in a.m.  GERD  -continue  with pantoprazole 40 mg p.o. twice daily but patient states that this did not work for her and states that she has had adverse effects to pantoprazole so she is going to ask her son to see if she can bring her home Nexium  Hypothyroidism -Continue with levothyroxine 224 mcg p.o. daily   Left Shoulder Pain -Voltaren Gel  -Check DG Left Shoulder   Super Morbid Obesity -Complicates overall prognosis and care -Estimated body mass index is 62.07 kg/m as calculated from the following:   Height as of this encounter: _0  (1.651 m).   Weight as of this encounter: 169.2 kg. -Weight Loss and Dietary Counseling given    DVT prophylaxis: Heparin 5000 units subcu q. 24 Code Status: FULL CODE Family Communication: No family at bedside Disposition Plan: Pending further clinical improvement and PT OT recommending SNF  Status is: Inpatient  Remains inpatient appropriate because:Unsafe d/c plan, IV treatments appropriate due to intensity of illness or inability to take PO, and Inpatient level of care appropriate due to severity of illness  Dispo: The patient is from: Home              Anticipated d/c is to: Home              Patient currently is not medically stable to d/c.   Difficult to place patient No  Consultants:  PCCM transfer  Procedures: None  Antimicrobials:  Anti-infectives (From admission, onward)    None        Subjective: Seen and examined at bedside and does not really recall what happened.  States that she was encephalopathic and found unresponsive by her caregiver.  She denies any nausea or vomiting currently.  She is much more awake and alert. Complaining of left shoulder pain.  She states that when the EMTs helped her up as she fell previously she feels like they may have hurt her shoulder.  No other concerns or complaints at this time.  Objective: Vitals:   10/02/20 1230 10/02/20 1546 10/02/20 1739 10/02/20 1741  BP:   137/74 137/74  Pulse: 88  81 79  Resp: (!)  25   18  Temp:  98.6 F (37 C) 98.4 F (36.9 C) 98.4 F (36.9 C)  TempSrc:  Oral Oral Oral  SpO2: 97%  97% 98%  Weight:      Height:        Intake/Output Summary (Last 24 hours) at 10/02/2020 1758 Last data filed at 10/02/2020 0600 Gross per 24 hour  Intake 240 ml  Output 700 ml  Net -460 ml   Filed Weights   10/01/20 0400 10/01/20 2259 10/02/20 0400  Weight: (!) 167.4 kg (!) 167.4 kg (!) 169.2 kg   Examination: Physical Exam:  Constitutional: WN/WD super morbidly obese in NAD and appears calm and comfortable Eyes:  Lids and conjunctivae normal, sclerae anicteric  ENMT: External Ears, Nose appear normal. Grossly normal hearing.  Neck: Appears normal, supple, no cervical masses, normal ROM, no appreciable thyromegaly; no JVD Respiratory: Diminished to auscultation bilaterally, no wheezing, rales, rhonchi or crackles. Normal respiratory effort and patient is not tachypenic. No accessory muscle use.  Unlabored breathing Cardiovascular: RRR, no murmurs / rubs / gallops. S1 and S2 auscultated.  Has nonpitting lower extremity  edema Abdomen: Soft, non-tender, distended secondary body habitus. Bowel sounds positive.  GU: Deferred. Musculoskeletal: No clubbing / cyanosis of digits/nails. No joint deformity upper and lower extremities.  Skin: No rashes, lesions, ulcers on limited skin evaluation. No induration; Warm and dry.  Neurologic: CN 2-12 grossly intact with no focal deficits. Romberg sign and cerebellar reflexes not assessed.  Psychiatric: Normal judgment and insight. Alert and oriented x 3. Normal mood and appropriate affect.   Data Reviewed: I have personally reviewed following labs and imaging studies  CBC: Recent Labs  Lab 09/29/20 1441 09/29/20 1452 09/29/20 1453 09/30/20 0437 09/30/20 0447 10/01/20 0456 10/02/20 1231  WBC 10.7*  --   --  8.8  --  7.8 9.2  NEUTROABS 8.2*  --   --   --   --   --  6.8  HGB 11.2*   < > 11.9*  11.9* 10.7* 10.9* 10.2* 10.6*  HCT  34.7*   < > 35.0*  35.0* 32.4* 32.0* 31.2* 32.7*  MCV 91.8  --   --  89.3  --  89.1 91.9  PLT 160  --   --  194  --  199 192   < > = values in this interval not displayed.   Basic Metabolic Panel: Recent Labs  Lab 09/29/20 1441 09/29/20 1452 09/29/20 1453 09/30/20 0437 09/30/20 0447 09/30/20 1215 10/01/20 0456 10/02/20 0421  NA 129* 129*   < > 134* 134* 136 135 139  K 2.8* 2.8*   < > 3.0* 3.0* 3.5 3.5 3.6  CL 91* 92*  --  93*  --  95* 97* 101  CO2 24  --   --  29  --  _0 GLUCOSE 229* 236*  --  221*  --  220* 188* 172*  BUN 52* 53*  --  41*  --  34* 29* 24*  CREATININE 2.01* 2.10*  --  1.53*  --  1.43* 1.43* 1.24*  CALCIUM 9.6  --   --  9.4  --  9.6 9.3 9.4  MG 2.2  --   --  2.1  --   --   --   --   PHOS  --   --   --  3.0  --   --   --   --    < > = values in this interval not displayed.   GFR: Estimated Creatinine Clearance: 73.7 mL/min (A) (by C-G formula based on SCr of 1.24 mg/dL (H)). Liver Function Tests: Recent Labs  Lab 09/29/20 1441 09/30/20 0437  AST 24 20  ALT 20 20  ALKPHOS 77 76  BILITOT 0.5 0.6  PROT 6.8 6.3*  ALBUMIN 4.0 3.5   Recent Labs  Lab 09/30/20 0437  LIPASE 35  AMYLASE 49   No results for input(s): AMMONIA in the last 168 hours. Coagulation Profile: Recent Labs  Lab 09/30/20 0437  INR 1.1   Cardiac Enzymes: No results for input(s): CKTOTAL, CKMB, CKMBINDEX, TROPONINI in the last 168 hours. BNP (last 3 results) No results for input(s): PROBNP in the last 8760 hours. HbA1C: Recent Labs    09/30/20 0437  HGBA1C 9.4*   CBG: Recent Labs  Lab 10/02/20 0409 10/02/20 0704 10/02/20 0810 10/02/20 1055 10/02/20 1547  GLUCAP 171* 166* 282* 214* 171*   Lipid Profile: No results for input(s): CHOL, HDL, LDLCALC, TRIG, CHOLHDL, LDLDIRECT in the last 72 hours. Thyroid Function Tests: Recent Labs    09/30/20 0437  TSH 3.752  FREET4  0.76   Anemia Panel: No results for input(s): VITAMINB12, FOLATE, FERRITIN, TIBC, IRON,  RETICCTPCT in the last 72 hours. Sepsis Labs: Recent Labs  Lab 09/29/20 1441 09/30/20 0437  PROCALCITON  --  0.41  LATICACIDVEN 1.6 1.9    Recent Results (from the past 240 hour(s))  MRSA Next Gen by PCR, Nasal     Status: Abnormal   Collection Time: 09/29/20  4:10 PM   Specimen: Nasal Mucosa; Nasal Swab  Result Value Ref Range Status   MRSA by PCR Next Gen DETECTED (A) NOT DETECTED Final    Comment: RESULT CALLED TO, READ BACK BY AND VERIFIED WITH: RN Windle Guard [phone] [id number] MLM (NOTE) The GeneXpert MRSA Assay (FDA approved for NASAL specimens only), is one component of a comprehensive MRSA colonization surveillance program. It is not intended to diagnose MRSA infection nor to guide or monitor treatment for MRSA infections. Test performance is not FDA approved in patients less than 55 years old. Performed at [facility], [place] [address] [place], [place] [address]   Culture, blood (routine x 2)     Status: None (Preliminary result)   Collection Time: 09/29/20  7:32 PM   Specimen: BLOOD  Result Value Ref Range Status   Specimen Description BLOOD RIGHT ARM  Final   Special Requests   Final    BOTTLES DRAWN AEROBIC ONLY Blood Culture results may not be optimal due to an inadequate volume of blood received in culture bottles   Culture   Final    NO GROWTH 3 DAYS Performed at [facility], [place] [address], [place], [place] [address]    Report Status PENDING  Incomplete  Culture, blood (routine x 2)     Status: None (Preliminary result)   Collection Time: 09/29/20  7:35 PM   Specimen: BLOOD  Result Value Ref Range Status   Specimen Description BLOOD LEFT HAND  Final   Special Requests   Final    BOTTLES DRAWN AEROBIC ONLY Blood Culture results may not be optimal due to an inadequate volume of blood received in culture bottles   Culture   Final    NO GROWTH 3 DAYS Performed at [facility], [place] [address] [place], [place] [address]    Report Status PENDING   Incomplete  Resp Panel by RT-PCR (Flu A&B, Covid)     Status: None   Collection Time: 09/30/20  4:37 AM  Result Value Ref Range Status   SARS Coronavirus 2 by RT PCR NEGATIVE NEGATIVE Final    Comment: (NOTE) SARS-CoV-2 target nucleic acids are NOT DETECTED.  The SARS-CoV-2 RNA is generally detectable in upper respiratory specimens during the acute phase of infection. The lowest concentration of SARS-CoV-2 viral copies this assay can detect is 138 copies/mL. A negative result does not preclude SARS-Cov-2 infection and should not be used as the sole basis for treatment or other patient management decisions. A negative result may occur with  improper specimen collection/handling, submission of specimen other than nasopharyngeal swab, presence of viral mutation(s) within the areas targeted by this assay, and inadequate number of viral copies(<138 copies/mL). A negative result must be combined with clinical observations, patient history, and epidemiological information. The expected result is Negative.  Fact Sheet for Patients:  EntrepreneurPulse.com.au  Fact Sheet for Healthcare Providers:  IncredibleEmployment.be  This test is no t yet approved or cleared by the [place] FDA and  has been authorized for detection and/or diagnosis of SARS-CoV-2 by FDA under  an Emergency Use Authorization (EUA). This EUA will remain  in effect (meaning this test can be used) for the duration of the COVID-19 declaration under Section 564(b)(1) of the Act, 21 U.S.C.section 360bbb-3(b)(1), unless the authorization is terminated  or revoked sooner.       Influenza A by PCR NEGATIVE NEGATIVE Final   Influenza B by PCR NEGATIVE NEGATIVE Final    Comment: (NOTE) The Xpert Xpress SARS-CoV-2/FLU/RSV plus assay is intended as an aid in the diagnosis of influenza from Nasopharyngeal swab specimens and should not be used as a sole basis for treatment. Nasal washings  and aspirates are unacceptable for Xpert Xpress SARS-CoV-2/FLU/RSV testing.  Fact Sheet for Patients: EntrepreneurPulse.com.au  Fact Sheet for Healthcare Providers: IncredibleEmployment.be  This test is not yet approved or cleared by the [place] FDA and has been authorized for detection and/or diagnosis of SARS-CoV-2 by FDA under an Emergency Use Authorization (EUA). This EUA will remain in effect (meaning this test can be used) for the duration of the COVID-19 declaration under Section 564(b)(1) of the Act, 21 U.S.C. section 360bbb-3(b)(1), unless the authorization is terminated or revoked.  Performed at [facility], [place] [address], [place], [place] [address]     RN Pressure Injury Documentation:     Estimated body mass index is 62.07 kg/m as calculated from the following:   Height as of this encounter: 5' 5" (1.651 m).   Weight as of this encounter: 169.2 kg.  Malnutrition Type:   Malnutrition Characteristics:   Nutrition Interventions:  Radiology Studies: No results found.  Scheduled Meds:  aspirin EC  81 mg Oral Daily   Chlorhexidine Gluconate Cloth  6 each Topical Daily   diclofenac Sodium  2 g Topical QID   heparin injection (subcutaneous)  5,000 Units Subcutaneous Q8H   insulin aspart  0-20 Units Subcutaneous Q4H   insulin glargine-yfgn  5 Units Subcutaneous Daily   levothyroxine  224 mcg Oral Q0600   linaclotide  290 mcg Oral QAC breakfast   methocarbamol  500 mg Oral TID   pantoprazole  40 mg Oral BID AC   rosuvastatin  20 mg Oral Daily   sodium chloride flush  10-40 mL Intracatheter Q12H   venlafaxine XR  150 mg Oral BID   ziprasidone  40 mg Oral Daily   Continuous Infusions:  sodium chloride Stopped (09/30/20 1218)    LOS: 3 days   Kerney Elbe, DO [facility] PAGER is on AMION  If 7PM-7AM, please contact night-coverage [email or web address]

## 2020-10-02 NOTE — Progress Notes (Signed)
Patient transferred from [facility]

## 2020-10-02 NOTE — TOC Initial Note (Addendum)
Transition of Care [facility]) - Initial/Assessment Note    Patient Details  Name: [name] MRN: [id number] Date of Birth: 10-13-1955  Transition of Care [facility]) CM/SW Contact:    Trula Ore, [place] Phone Number: 10/02/2020, 3:10 PM  Clinical Narrative:                   CSW received consult for possible SNF placement at time of discharge. CSW spoke with patient at bedside regarding PT recommendation of SNF placement at time of discharge. Patient comes from home alone. Patient expressed understanding of PT recommendation and is agreeable to SNF placement at time of discharge. Patient reports preference for [facility] .  Patient has received the COVID vaccines as well as boosters. Patient reports last booster was 8/22.  Patients last name was incorrect in [place] must CSW updated  must with correct last name for Passr number for [place].No further questions reported at this time. CSW to continue to follow and assist with discharge planning needs.   Expected Discharge Plan: Skilled Nursing Facility Barriers to Discharge: Continued Medical Work up   Patient Goals and CMS Choice Patient states their goals for this hospitalization and ongoing recovery are:: to go to SNF CMS Medicare.gov Compare Post Acute Care list provided to:: Patient Choice offered to / list presented to : Patient  Expected Discharge Plan and Services Expected Discharge Plan: [place] In-house Referral: Clinical Social Work     Living arrangements for the past 2 months: [place]                                      Prior Living Arrangements/Services Living arrangements for the past 2 months: Single Family Home Lives with:: Self Patient language and need for interpreter reviewed:: Yes Do you feel safe going back to the place where you live?: No   SNF  Need for Family Participation in Patient Care: Yes (Comment) Care giver support system in place?: Yes (comment)   Criminal  Activity/Legal Involvement Pertinent to Current Situation/Hospitalization: No - Comment as needed  Activities of Daily Living Home Assistive Devices/Equipment: Wheelchair ADL Screening (condition at time of admission) Patient's cognitive ability adequate to safely complete daily activities?: Yes Is the patient deaf or have difficulty hearing?: No Does the patient have difficulty seeing, even when wearing glasses/contacts?: No Does the patient have difficulty concentrating, remembering, or making decisions?: No Patient able to express need for assistance with ADLs?: Yes Does the patient have difficulty dressing or bathing?: Yes Independently performs ADLs?: No Communication: Needs assistance Is this a change from baseline?: Pre-admission baseline Dressing (OT): Needs assistance Is this a change from baseline?: Pre-admission baseline Grooming: Needs assistance Is this a change from baseline?: Pre-admission baseline Feeding: Independent Bathing: Needs assistance Is this a change from baseline?: Pre-admission baseline Toileting: Independent In/Out Bed: Independent Walks in Home: Independent Does the patient have difficulty walking or climbing stairs?: Yes Weakness of Legs: Both Weakness of Arms/Hands: None  Permission Sought/Granted Permission sought to share information with : Case Manager, Family Supports, Customer service manager Permission granted to share information with : Yes, Verbal Permission Granted  Share Information with NAME: [name]  Permission granted to share info w AGENCY: SNF  Permission granted to share info w Relationship: Son  Permission granted to share info w Contact Information: [name] [phone]  Emotional Assessment Appearance:: Appears stated age Attitude/Demeanor/Rapport: Gracious Affect (typically observed):  Calm Orientation: : Oriented to Self, Oriented to Place, Oriented to  Time, Oriented to Situation Alcohol / Substance Use: Not  Applicable Psych Involvement: No (comment)  Admission diagnosis:  AMS (altered mental status) [R41.82] Patient Active Problem List   Diagnosis Date Noted   AMS (altered mental status) 09/29/2020   Hyperparathyroidism, secondary ([place]) 03/23/2018   Memory difficulty 12/22/2017   Spinal stenosis 06/09/2017   HNP (herniated nucleus pulposus), lumbar 12/05/2016   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   Diabetic neuropathy ([place]) 09/08/2016   Hypocalcemia 08/22/2016   Abnormal barium swallow 07/07/2016   Anxiety 07/07/2016   History of malignant neoplasm of breast 07/07/2016   Gait abnormality 05/29/2016   Encephalopathy acute 04/23/2016   Hyponatremia 04/23/2016   Schizophrenia, paranoid ([place])    Aspiration pneumonia of left lower lobe due to gastric secretions (HCC)    Projectile vomiting with nausea    Altered mental status    Schizophrenia ([place])    Disorganized schizophrenia ([place])    Chronic pain syndrome    Acute renal failure (HCC)    Controlled diabetes mellitus type 2 with complications ([place])    Acute encephalopathy    Oropharyngeal dysphagia    Aspiration pneumonia of right lower lobe due to vomit ([place])    Acute renal failure (ARF) ([place]) 03/28/2016   Encephalopathy 03/27/2016   HCAP (healthcare-associated pneumonia) 02/29/2016   Pneumonia 02/14/2016   CAP (community acquired pneumonia) 02/13/2016   Edema 02/13/2016   AKI (acute kidney injury) ([place]) 02/13/2016   Elevated troponin 06/24/2015   Diarrhea 06/23/2015   Sepsis ([place]) 06/23/2015   Obesity, morbid, BMI 50 or higher ([place]) 03/26/2015   Post traumatic stress disorder (PTSD) 11/14/2014   Leukocytosis 11/12/2014   Slurred speech 11/12/2014   CKD (chronic kidney disease) stage 3, GFR 30-59 ml/min (HCC)    Diabetes mellitus, type II, insulin dependent ([place])    TIA (transient ischemic attack)    Hypotension 10/28/2014   Syncope 10/28/2014   Anemia 10/28/2014    Type II diabetes mellitus ([place]) 10/28/2014   OSA on CPAP 10/28/2014   Chronic lower back pain 10/28/2014   Polyneuropathy in diabetes(357.2) 10/28/2014   Diabetic polyneuropathy ([place])    Fall 08/14/2014   Recurrent falls 08/14/2014   Hot flashes 02/24/2014   Arthralgia 02/24/2014   Hair loss 02/24/2014   Chest pain 01/06/2014   Shock ([place]) 08/08/2013   Secondary parkinsonism ([place]) 12/30/2012   Dysarthria 12/30/2012   Benign paroxysmal positional vertigo 12/30/2012   Breast cancer of upper-outer quadrant of right female breast ([place]) 11/18/2012   Hemorrhage of rectum and anus 09/28/2012   Hx of radiation therapy    Syncope and collapse 06/04/2011   HSV 10/05/2008   Mild persistent asthma 06/02/2008   Dyspnea 07/19/2007   HLD (hyperlipidemia) 06/02/2006   Sinus tachycardia 05/25/2006   Hypothyroidism 02/27/2006   Depression 02/27/2006   Essential hypertension 02/27/2006   GERD 02/27/2006   KNEE PAIN 02/27/2006   PCP:  Fredrich Romans, PA Pharmacy:   [place], [place] [address] [place] [address] Phone: [phone] Fax: [phone]     Social Determinants of Health (SDOH) Interventions    Readmission Risk Interventions No flowsheet data found.

## 2020-10-02 NOTE — Evaluation (Signed)
Occupational Therapy Evaluation Patient Details Name: [name] MRN: [id number] DOB: [birth year] Today's Date: 10/02/2020    History of Present Illness 65 yo woman transported to ED on 8/13 by EMS due to decr responsiveness; overdose in pain meds, Narcan given;   has a past medical history including, but not limited to Anemia, Anxiety, Benign paroxysmal positional vertigo (12/30/2012), Breast cancer ([place]) (02/13/12),  CKD (chronic kidney disease), stage III ([place]),  Gait abnormality (05/29/2016),  HSV (herpes simplex virus) infection, radiation therapy (05/05/12- 07/15/12), Knee pain, bilateral, Memory difficulty (12/22/2017), OSA on CPAP, Pancreatitis, Pneumonia (1950's), Polyneuropathy in diabetes(357.2), Schizophrenia ([place]), 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   Clinical Impression   PTA patient independent using w/c for mobility and ambulating into restroom, aide 2-3 hours a day assisting with ADLs and IADLs. Admitted for above and limited by problem list below, including pain, decreased activity tolerance and generalized weakness. She currently requires min guard for transfers, max assist for LB ADLs and total assist for toileting.  Baseline assist required for LB bathing and dressing, but able to toilet herself and ambulate into restroom independently.  Based on performance today, believe pt will benefit from SNF level rehab to optimize independence with ADLs (toileting/transfers) to safely return home alone. Will follow acutely.     Follow Up Recommendations  SNF;Supervision/Assistance - 24 hour    Equipment Recommendations  Other (comment) (TBD)    Recommendations for Other Services       Precautions / Restrictions Precautions Precautions: Fall Restrictions Weight Bearing Restrictions: No      Mobility Bed Mobility Overal bed mobility: Needs Assistance Bed Mobility: Supine to Sit     Supine to sit:  Min assist     General bed mobility comments: min assist to scoot foward and ascend trunk, increased time requierd    Transfers Overall transfer level: Needs assistance   Transfers: Sit to/from Stand;Stand Pivot Transfers Sit to Stand: Min guard Stand pivot transfers: Min guard       General transfer comment: min guard to stand and pivot to BSC/recliner, good safety awareness (+2 present for safety)    Balance Overall balance assessment: Needs assistance Sitting-balance support: Feet supported;No upper extremity supported Sitting balance-Leahy Scale: Fair     Standing balance support: Bilateral upper extremity supported;During functional activity Standing balance-Leahy Scale: Poor Standing balance comment: relies on BUE support                           ADL either performed or assessed with clinical judgement   ADL Overall ADL's : Needs assistance/impaired     Grooming: Set up;Sitting   Upper Body Bathing: Set up;Sitting   Lower Body Bathing: Maximal assistance;Sit to/from stand   Upper Body Dressing : Set up;Sitting   Lower Body Dressing: Maximal assistance;Sit to/from stand   Toilet Transfer: Min guard;+2 for [employer] Transfer Details (indicate cue type and reason): to [place] Manipulation and Hygiene: Total assistance;Sit to/from stand       Functional mobility during ADLs: Min guard General ADL Comments: limited by pain, decreased activity tolerance and generalized weakness     Vision         Perception     Praxis      Pertinent Vitals/Pain Pain Assessment: 0-10 Pain Score: 8  Pain Location: back Pain Descriptors / Indicators: Discomfort;Grimacing;Guarding Pain Intervention(s): Limited activity within patient's tolerance;Monitored during session;Repositioned     Hand  Dominance Right   Extremity/Trunk Assessment Upper Extremity Assessment Upper Extremity Assessment: Generalized weakness   Lower  Extremity Assessment Lower Extremity Assessment: Defer to PT evaluation   Cervical / Trunk Assessment Cervical / Trunk Assessment: Other exceptions Cervical / Trunk Exceptions: increased body habitus   Communication Communication Communication: No difficulties   Cognition Arousal/Alertness: Awake/alert Behavior During Therapy: WFL for tasks assessed/performed Overall Cognitive Status: Within Functional Limits for tasks assessed                                     General Comments  VSS on RA    Exercises     Shoulder Instructions      Home Living Family/patient expects to be discharged to:: Private residence Living Arrangements: Alone Available Help at Discharge: Personal care attendant (aide 2-3 hrs/day) Type of Home: House Home Access: Ramped entrance     Home Layout: One level     Bathroom Shower/Tub: Occupational psychologist: Standard     Home Equipment: Wheelchair - manual;Grab bars - toilet;Toilet riser;Shower seat;Adaptive equipment Adaptive Equipment: Reacher        Prior Functioning/Environment Level of Independence: Needs assistance  Gait / Transfers Assistance Needed: uses wc for mobility, ambulates into bathroom ADL's / Homemaking Assistance Needed: aide assists with bathing, dressing, and IADLs; manages toileting with independeence   Comments: aide 2 1/2 hrs daily        OT Problem List: Decreased strength;Decreased activity tolerance;Impaired balance (sitting and/or standing);Decreased safety awareness;Decreased knowledge of use of DME or AE;Decreased knowledge of precautions;Pain;Obesity      OT Treatment/Interventions: Self-care/ADL training;Therapeutic exercise;DME and/or AE instruction;Therapeutic activities;Patient/family education;Balance training;Energy conservation    OT Goals(Current goals can be found in the care plan section) Acute Rehab OT Goals Patient Stated Goal: to get stronger at rehab OT Goal  Formulation: With patient Time For Goal Achievement: 10/16/20 Potential to Achieve Goals: Good  OT Frequency: Min 2X/week   Barriers to D/C:            Co-evaluation PT/OT/SLP Co-Evaluation/Treatment: Yes Reason for Co-Treatment: For patient/therapist safety;To address functional/ADL transfers   OT goals addressed during session: ADL's and self-care      AM-PAC OT "6 Clicks" Daily Activity     Outcome Measure Help from another person eating meals?: None Help from another person taking care of personal grooming?: A Little Help from another person toileting, which includes using toliet, bedpan, or urinal?: Total Help from another person bathing (including washing, rinsing, drying)?: A Lot Help from another person to put on and taking off regular upper body clothing?: A Little Help from another person to put on and taking off regular lower body clothing?: A Lot 6 Click Score: 15   End of Session Nurse Communication: Mobility status  Activity Tolerance: Patient limited by pain Patient left: in chair;with call bell/phone within reach  OT Visit Diagnosis: Other abnormalities of gait and mobility (R26.89);Pain;Muscle weakness (generalized) (M62.81);History of falling (Z91.81) Pain - part of body:  (back)                Time: 1230-1305 OT Time Calculation (min): 35 min Charges:  OT General Charges $OT Visit: 1 Visit OT Evaluation $OT Eval Moderate Complexity: 1 Mod  Jolaine Artist, OT Acute Rehabilitation Services Pager [phone] Office [phone]   Delight Stare 10/02/2020, 2:02 PM

## 2020-10-03 ENCOUNTER — Other Ambulatory Visit: Payer: Medicare Other

## 2020-10-03 DIAGNOSIS — R4182 Altered mental status, unspecified: Secondary | ICD-10-CM

## 2020-10-03 LAB — COMPREHENSIVE METABOLIC PANEL
ALT: 90 U/L — ABNORMAL HIGH (ref 0–44)
AST: 76 U/L — ABNORMAL HIGH (ref 15–41)
Albumin: 3.5 g/dL (ref 3.5–5.0)
Alkaline Phosphatase: 77 U/L (ref 38–126)
Anion gap: 7 (ref 5–15)
BUN: 23 mg/dL (ref 8–23)
CO2: 31 mmol/L (ref 22–32)
Calcium: 9.2 mg/dL (ref 8.9–10.3)
Chloride: 102 mmol/L (ref 98–111)
Creatinine, Ser: 1.22 mg/dL — ABNORMAL HIGH (ref 0.44–1.00)
GFR, Estimated: 50 mL/min — ABNORMAL LOW (ref >60)
Glucose, Bld: 171 mg/dL — ABNORMAL HIGH (ref 70–99)
Potassium: 3.7 mmol/L (ref 3.5–5.1)
Sodium: 140 mmol/L (ref 135–145)
Total Bilirubin: 0.6 mg/dL (ref 0.3–1.2)
Total Protein: 6.3 g/dL — ABNORMAL LOW (ref 6.5–8.1)

## 2020-10-03 LAB — RETICULOCYTES
Immature Retic Fract: 21.5 % — ABNORMAL HIGH (ref 2.3–15.9)
RBC.: 3.46 MIL/uL — ABNORMAL LOW (ref 3.87–5.11)
Retic Count, Absolute: 78.9 10*3/uL (ref 19.0–186.0)
Retic Ct Pct: 2.3 % (ref 0.4–3.1)

## 2020-10-03 LAB — FOLATE: Folate: 51 ng/mL (ref >5.9)

## 2020-10-03 LAB — CBC WITH DIFFERENTIAL/PLATELET
Abs Immature Granulocytes: 0.06 10*3/uL (ref 0.00–0.07)
Basophils Absolute: 0.1 10*3/uL (ref 0.0–0.1)
Basophils Relative: 1 %
Eosinophils Absolute: 0.2 10*3/uL (ref 0.0–0.5)
Eosinophils Relative: 2 %
HCT: 32.1 % — ABNORMAL LOW (ref 36.0–46.0)
Hemoglobin: 10.5 g/dL — ABNORMAL LOW (ref 12.0–15.0)
Immature Granulocytes: 1 %
Lymphocytes Relative: 20 %
Lymphs Abs: 1.8 10*3/uL (ref 0.7–4.0)
MCH: 29.8 pg (ref 26.0–34.0)
MCHC: 32.7 g/dL (ref 30.0–36.0)
MCV: 91.2 fL (ref 80.0–100.0)
Monocytes Absolute: 0.6 10*3/uL (ref 0.1–1.0)
Monocytes Relative: 7 %
Neutro Abs: 6.3 10*3/uL (ref 1.7–7.7)
Neutrophils Relative %: 69 %
Platelets: 195 10*3/uL (ref 150–400)
RBC: 3.52 MIL/uL — ABNORMAL LOW (ref 3.87–5.11)
RDW: 14.9 % (ref 11.5–15.5)
WBC: 9 10*3/uL (ref 4.0–10.5)
nRBC: 0 % (ref 0.0–0.2)

## 2020-10-03 LAB — VITAMIN B12: Vitamin B-12: 2250 pg/mL — ABNORMAL HIGH (ref 180–914)

## 2020-10-03 LAB — TSH: TSH: 8.111 u[IU]/mL — ABNORMAL HIGH (ref 0.350–4.500)

## 2020-10-03 LAB — GLUCOSE, CAPILLARY
Glucose-Capillary: 142 mg/dL — ABNORMAL HIGH (ref 70–99)
Glucose-Capillary: 164 mg/dL — ABNORMAL HIGH (ref 70–99)
Glucose-Capillary: 178 mg/dL — ABNORMAL HIGH (ref 70–99)
Glucose-Capillary: 181 mg/dL — ABNORMAL HIGH (ref 70–99)
Glucose-Capillary: 184 mg/dL — ABNORMAL HIGH (ref 70–99)
Glucose-Capillary: 184 mg/dL — ABNORMAL HIGH (ref 70–99)

## 2020-10-03 LAB — IRON AND TIBC
Iron: 48 ug/dL (ref 28–170)
Saturation Ratios: 14 % (ref 10.4–31.8)
TIBC: 339 ug/dL (ref 250–450)
UIBC: 291 ug/dL

## 2020-10-03 LAB — FERRITIN: Ferritin: 89 ng/mL (ref 11–307)

## 2020-10-03 LAB — MAGNESIUM: Magnesium: 2.3 mg/dL (ref 1.7–2.4)

## 2020-10-03 LAB — PHOSPHORUS: Phosphorus: 3.7 mg/dL (ref 2.5–4.6)

## 2020-10-03 MED ORDER — MUPIROCIN 2 % EX OINT
TOPICAL_OINTMENT | Freq: Two times a day (BID) | CUTANEOUS | Status: DC
  Filled 2020-10-03 (×2): qty 22

## 2020-10-03 NOTE — Plan of Care (Signed)

## 2020-10-03 NOTE — Care Management Important Message (Signed)
Important Message  Patient Details  Name: [name] MRN: [id number] Date of Birth: [birth year]   Medicare Important Message Given:  Yes     Tameah Mihalko [address]ine Circle 10/03/2020, 3:10 PM

## 2020-10-03 NOTE — Progress Notes (Signed)
PROGRESS NOTE    [name]  DDU:[id number] DOB: [birth year] DOA: 09/29/2020 PCP: Fredrich Romans, PA   Brief Narrative: This 65 years old morbidly obese female with plethora of health issues including anemia, anxiety, asthma, BPPV, history of breast cancer, chronic back pain, depression, GERD, history of hiatal hernia and, stomach ulcers, HCV infection, hypothyroidism, nephrolithiasis, OSA, history of polyneuropathy and secondary parkinsonism as well as type 2 diabetes and other comorbidities presented after she was found poorly responsive.  Patient was brought in the ED,  She was given Narcan in route and twice in the ED she had improvement in her respirations and was admitted in the ICU without being intubated.  Her narcotics were held and she was admitted for possible overdose.  She was continued on Narcan drip and now she is titrated off.  CT head was done which ruled out any organic cause .  Subsequently she improved and became alert and oriented. Patient was admitted for altered mentation probably secondary to polypharmacy, now back to her baseline mental status.   Assessment & Plan:   Active Problems:   AMS (altered mental status)   Acute metabolic encephalopathy in the setting of presumed hypercarbic respiratory failure with questionable drug overdose> Resolved. Known obesity-hypoventilation syndrome and OSA and COPD. Patient presented poorly responsive.  She was admitted in ICU and started on Narcan infusion. Narcan infusion d/c'd 8/15.  Patient is now back to her baseline mental status. Encouraged increased mobility. PT/OT recommending SNF Continue CPAP at night. Continue with delirium precautions Avoid sedating medications.   Chronic pain syndrome: She is resumed on home Flexeril, Robaxin and oxycodone. Lyrica, tizanidine has not started yet.  Will resume depends how she feels.  Schizophrenia paranoid: Anxiety and depression: Continue Geodon 60 mg daily, venlafaxine  from 50 mg twice daily Continue lorazepam 0.5 mg every 8 hours as needed for anxiety  AKI on CKD 3b: Renal functions back to baseline. Continue to monitor renal functions avoid nephrotoxic medications  Diabetes mellitus : Continue sliding scale. Continue Semglee 5 units subcu.  Hyponatremia: Replaced and resolved. Na+ 40  Hypokalemia : Replaced and resolved. K+3.7  GERD: Continue pantoprazole.  Hypothyroidism: Continue levothyroxine.  Chronic normocytic anemia: Transfuse for hemoglobin less than 7. There is no overt bleeding noted.   Continue to monitor for signs and symptoms of bleeding Chronic normocytic anemia - stable  Morbid obesity: BMI 62.07 Weight Loss and Dietary Counseling given .   DVT prophylaxis: Heparin Code Status: Full code. Family Communication: No family at bed side. Disposition Plan:   Status is: Inpatient  Remains inpatient appropriate because:Inpatient level of care appropriate due to severity of illness  Dispo: The patient is from: Home              Anticipated d/c is to: Home              Patient currently is not medically stable to d/c.   Difficult to place patient No  Consultants:  None  Procedures:  None Antimicrobials:   Anti-infectives (From admission, onward)    None        Subjective: Patient was seen and examined at bedside.  Overnight events noted.   She is alert and oriented x 3.  She is lying comfortably.  Patient knows the name of President and vice president. She reports pain in the right leg,  denies any other concerns.  Objective: Vitals:   10/02/20 2114 10/03/20 0045 10/03/20 0447 10/03/20 1101  BP: (!) 149/81   [name])  155/91  Pulse: 79 77  71  Resp: (!) _0 Temp: 98.2 F (36.8 C)   98.8 F (37.1 C)  TempSrc: Oral   Oral  SpO2: 96% 97%  98%  Weight: (!) 169.2 kg  (!) 169.1 kg   Height:        Intake/Output Summary (Last 24 hours) at 10/03/2020 1204 Last data filed at 10/02/2020 2114 Gross per 24  hour  Intake 0 ml  Output 300 ml  Net -300 ml   Filed Weights   10/02/20 0400 10/02/20 2114 10/03/20 0447  Weight: (!) 169.2 kg (!) 169.2 kg (!) 169.1 kg    Examination:  General exam: Appears calm and comfortable, not in any acute distress. Respiratory system: Clear to auscultation. Respiratory effort normal. Cardiovascular system: S1 & S2 heard, RRR. No JVD, murmurs, rubs, gallops or clicks. No pedal edema. Gastrointestinal system: Abdomen is nondistended, soft and nontender. No organomegaly or masses felt. Normal bowel sounds heard. Central nervous system: Alert and oriented. No focal neurological deficits. Extremities: No edema, no cyanosis, no clubbing.  Right leg tenderness. Skin: No rashes, lesions or ulcers Psychiatry: Judgement and insight appear normal. Mood & affect appropriate.     Data Reviewed: I have personally reviewed following labs and imaging studies  CBC: Recent Labs  Lab 09/29/20 1441 09/29/20 1452 09/30/20 0437 09/30/20 0447 10/01/20 0456 10/02/20 1231 10/03/20 0404  WBC 10.7*  --  8.8  --  7.8 9.2 9.0  NEUTROABS 8.2*  --   --   --   --  6.8 6.3  HGB 11.2*   < > 10.7* 10.9* 10.2* 10.6* 10.5*  HCT 34.7*   < > 32.4* 32.0* 31.2* 32.7* 32.1*  MCV 91.8  --  89.3  --  89.1 91.9 91.2  PLT 160  --  194  --  199 192 195   < > = values in this interval not displayed.   Basic Metabolic Panel: Recent Labs  Lab 09/29/20 1441 09/29/20 1452 09/30/20 0437 09/30/20 0447 09/30/20 1215 10/01/20 0456 10/02/20 0421 10/03/20 0404  NA 129*   < > 134* 134* 136 135 139 140  K 2.8*   < > 3.0* 3.0* 3.5 3.5 3.6 3.7  CL 91*   < > 93*  --  95* 97* 101 102  CO2 24  --  29  --  _1 GLUCOSE 229*   < > 221*  --  220* 188* 172* 171*  BUN 52*   < > 41*  --  34* 29* 24* 23  CREATININE 2.01*   < > 1.53*  --  1.43* 1.43* 1.24* 1.22*  CALCIUM 9.6  --  9.4  --  9.6 9.3 9.4 9.2  MG 2.2  --  2.1  --   --   --   --  2.3  PHOS  --   --  3.0  --   --   --   --  3.7    < > = values in this interval not displayed.   GFR: Estimated Creatinine Clearance: 74.9 mL/min (A) (by C-G formula based on SCr of 1.22 mg/dL (H)). Liver Function Tests: Recent Labs  Lab 09/29/20 1441 09/30/20 0437 10/03/20 0404  AST 24 20 76*  ALT 20 20 90*  ALKPHOS 77 76 77  BILITOT 0.5 0.6 0.6  PROT 6.8 6.3* 6.3*  ALBUMIN 4.0 3.5 3.5   Recent Labs  Lab 09/30/20 0437  LIPASE 35  AMYLASE  49   No results for input(s): AMMONIA in the last 168 hours. Coagulation Profile: Recent Labs  Lab 09/30/20 0437  INR 1.1   Cardiac Enzymes: No results for input(s): CKTOTAL, CKMB, CKMBINDEX, TROPONINI in the last 168 hours. BNP (last 3 results) No results for input(s): PROBNP in the last 8760 hours. HbA1C: No results for input(s): HGBA1C in the last 72 hours. CBG: Recent Labs  Lab 10/02/20 2030 10/02/20 2329 10/03/20 0441 10/03/20 0808 10/03/20 1154  GLUCAP 191* 165* 181* 184* 178*   Lipid Profile: No results for input(s): CHOL, HDL, LDLCALC, TRIG, CHOLHDL, LDLDIRECT in the last 72 hours. Thyroid Function Tests: Recent Labs    10/03/20 0404  TSH 8.111*   Anemia Panel: Recent Labs    10/03/20 0404  VITAMINB12 2,250*  FOLATE 51.0  FERRITIN 89  TIBC 339  IRON 48  RETICCTPCT 2.3   Sepsis Labs: Recent Labs  Lab 09/29/20 1441 09/30/20 0437  PROCALCITON  --  0.41  LATICACIDVEN 1.6 1.9    Recent Results (from the past 240 hour(s))  MRSA Next Gen by PCR, Nasal     Status: Abnormal   Collection Time: 09/29/20  4:10 PM   Specimen: Nasal Mucosa; Nasal Swab  Result Value Ref Range Status   MRSA by PCR Next Gen DETECTED (A) NOT DETECTED Final    Comment: RESULT CALLED TO, READ BACK BY AND VERIFIED WITH: RN Windle Guard [phone] (NOTE) The GeneXpert MRSA Assay (FDA approved for NASAL specimens only), is one component of a comprehensive MRSA colonization surveillance program. It is not intended to diagnose MRSA infection nor to guide or monitor treatment for  MRSA infections. Test performance is not FDA approved in patients less than 3 years old. Performed at [facility], [place] [address] [place], [place] [address]   Culture, blood (routine x 2)     Status: None (Preliminary result)   Collection Time: 09/29/20  7:32 PM   Specimen: BLOOD  Result Value Ref Range Status   Specimen Description BLOOD RIGHT ARM  Final   Special Requests   Final    BOTTLES DRAWN AEROBIC ONLY Blood Culture results may not be optimal due to an inadequate volume of blood received in culture bottles   Culture   Final    NO GROWTH 4 DAYS Performed at [facility], [place] [address] [place], [place] [address]    Report Status PENDING  Incomplete  Culture, blood (routine x 2)     Status: None (Preliminary result)   Collection Time: 09/29/20  7:35 PM   Specimen: BLOOD  Result Value Ref Range Status   Specimen Description BLOOD LEFT HAND  Final   Special Requests   Final    BOTTLES DRAWN AEROBIC ONLY Blood Culture results may not be optimal due to an inadequate volume of blood received in culture bottles   Culture   Final    NO GROWTH 4 DAYS Performed at [facility], [place] [address] [place], [place] [address]    Report Status PENDING  Incomplete  Resp Panel by RT-PCR (Flu A&B, Covid)     Status: None   Collection Time: 09/30/20  4:37 AM  Result Value Ref Range Status   SARS Coronavirus 2 by RT PCR NEGATIVE NEGATIVE Final    Comment: (NOTE) SARS-CoV-2 target nucleic acids are NOT DETECTED.  The SARS-CoV-2 RNA is generally detectable in upper respiratory specimens during the acute phase of infection. The lowest concentration of SARS-CoV-2 viral  copies this assay can detect is 138 copies/mL. A negative result does not preclude SARS-Cov-2 infection and should not be used as the sole basis for treatment or other patient management decisions. A negative result may occur with  improper specimen collection/handling, submission of specimen other than  nasopharyngeal swab, presence of viral mutation(s) within the areas targeted by this assay, and inadequate number of viral copies(<138 copies/mL). A negative result must be combined with clinical observations, patient history, and epidemiological information. The expected result is Negative.  Fact Sheet for Patients:  EntrepreneurPulse.com.au  Fact Sheet for Healthcare Providers:  IncredibleEmployment.be  This test is no t yet approved or cleared by the [place] FDA and  has been authorized for detection and/or diagnosis of SARS-CoV-2 by FDA under an Emergency Use Authorization (EUA). This EUA will remain  in effect (meaning this test can be used) for the duration of the COVID-19 declaration under Section 564(b)(1) of the Act, 21 U.S.C.section 360bbb-3(b)(1), unless the authorization is terminated  or revoked sooner.       Influenza A by PCR NEGATIVE NEGATIVE Final   Influenza B by PCR NEGATIVE NEGATIVE Final    Comment: (NOTE) The Xpert Xpress SARS-CoV-2/FLU/RSV plus assay is intended as an aid in the diagnosis of influenza from Nasopharyngeal swab specimens and should not be used as a sole basis for treatment. Nasal washings and aspirates are unacceptable for Xpert Xpress SARS-CoV-2/FLU/RSV testing.  Fact Sheet for Patients: EntrepreneurPulse.com.au  Fact Sheet for Healthcare Providers: IncredibleEmployment.be  This test is not yet approved or cleared by the [place] FDA and has been authorized for detection and/or diagnosis of SARS-CoV-2 by FDA under an Emergency Use Authorization (EUA). This EUA will remain in effect (meaning this test can be used) for the duration of the COVID-19 declaration under Section 564(b)(1) of the Act, 21 U.S.C. section 360bbb-3(b)(1), unless the authorization is terminated or revoked.  Performed at [facility], [place] [address] [place], [place] [address]      Radiology Studies: DG Shoulder Left  Result Date: 10/02/2020 CLINICAL DATA:  Left shoulder pain. EXAM: LEFT SHOULDER - 2+ VIEW COMPARISON:  None. FINDINGS: There is no evidence of an acute fracture or dislocation. Chronic changes are seen involving the left acromion. Mild degenerative changes are seen involving the left acromioclavicular joint with mild to moderate severity degenerative changes along the left glenohumeral articulation. A left-sided venous catheter is in place. Radiopaque surgical clips are seen overlying the superior mediastinum. Soft tissues are otherwise unremarkable. IMPRESSION: Chronic and degenerative changes without an acute osseous abnormality. Electronically Signed   By: Virgina Norfolk M.D.   On: 10/02/2020 19:24    Scheduled Meds:  aspirin EC  81 mg Oral Daily   Chlorhexidine Gluconate Cloth  6 each Topical Daily   diclofenac Sodium  2 g Topical QID   heparin injection (subcutaneous)  5,000 Units Subcutaneous Q8H   insulin aspart  0-20 Units Subcutaneous Q4H   insulin glargine-yfgn  5 Units Subcutaneous Daily   levothyroxine  224 mcg Oral Q0600   linaclotide  290 mcg Oral QAC breakfast   methocarbamol  500 mg Oral TID   pantoprazole  40 mg Oral BID AC   rosuvastatin  20 mg Oral Daily   sodium chloride flush  10-40 mL Intracatheter Q12H   venlafaxine XR  150 mg Oral BID   ziprasidone  40 mg Oral Daily   Continuous Infusions:  sodium chloride Stopped (09/30/20 1218)     LOS: 4 days  Time spent: 35 mins.    Shawna Clamp, MD [facility]   If 7PM-7AM, please contact night-coverage

## 2020-10-03 NOTE — Progress Notes (Signed)
Pt refusing CPAP tonight. States that currently, there is no one that can bring her nasal pillows. Advised pt to notify for RT if she changes her mind and wants to use CPAP.

## 2020-10-03 NOTE — Progress Notes (Signed)
Placed patient on CPAP via nasal mask, 7.0 cm H20. Tolerating well at this time.

## 2020-10-04 DIAGNOSIS — R4182 Altered mental status, unspecified: Secondary | ICD-10-CM | POA: Diagnosis not present

## 2020-10-04 LAB — CULTURE, BLOOD (ROUTINE X 2)
Culture: NO GROWTH
Culture: NO GROWTH

## 2020-10-04 LAB — GLUCOSE, CAPILLARY
Glucose-Capillary: 149 mg/dL — ABNORMAL HIGH (ref 70–99)
Glucose-Capillary: 186 mg/dL — ABNORMAL HIGH (ref 70–99)
Glucose-Capillary: 130 mg/dL — ABNORMAL HIGH (ref 70–99)
Glucose-Capillary: 143 mg/dL — ABNORMAL HIGH (ref 70–99)
Glucose-Capillary: 132 mg/dL — ABNORMAL HIGH (ref 70–99)

## 2020-10-04 NOTE — Progress Notes (Signed)
PROGRESS NOTE    [name]  ESP:[id number] DOB: [birth year] DOA: 09/29/2020 PCP: Fredrich Romans, PA   Brief Narrative: This 65 years old morbidly obese female with plethora of health issues including anemia, anxiety, asthma, BPPV, history of breast cancer, chronic back pain, depression, GERD, history of hiatal hernia and, stomach ulcers, HCV infection, hypothyroidism, nephrolithiasis, OSA, history of polyneuropathy and secondary parkinsonism as well as type 2 diabetes and other comorbidities presented after she was found poorly responsive.  Patient was brought in the ED,  She was given Narcan in route and twice in the ED she had improvement in her respirations and was admitted in the ICU without being intubated.  Her narcotics were held and she was admitted for possible overdose.  She was continued on Narcan drip and now she is titrated off.  CT head was done which ruled out any organic cause .  Subsequently she improved and became alert and oriented. Patient was admitted for altered mentation probably secondary to polypharmacy, now back to her baseline mental status.  Assessment & Plan:   Active Problems:   AMS (altered mental status)   Acute metabolic encephalopathy in the setting of presumed hypercarbic respiratory failure with questionable drug overdose> Resolved. Known obesity-hypoventilation syndrome and OSA and COPD. Patient presented poorly responsive.  She was admitted in ICU and started on Narcan infusion. Narcan infusion d/c'd 8/15.  Patient is now back to her baseline mental status. Encouraged increased mobility. PT/OT recommending SNF Continue CPAP at night. Continue with delirium precautions Avoid sedating medications.   Chronic pain syndrome: She is resumed on home Flexeril, Robaxin and oxycodone. Lyrica, tizanidine has not started yet.  Will resume depends how she feels.  Schizophrenia paranoid: Anxiety and depression: Continue Geodon 60 mg daily, venlafaxine 150  mg twice daily Continue lorazepam 0.5 mg every 8 hours as needed for anxiety  AKI on CKD 3b: Renal functions back to baseline. Continue to monitor renal functions,  avoid nephrotoxic medications  Diabetes mellitus : Continue sliding scale. Continue Semglee 5 units subcu.daily.  Hyponatremia: Replaced and resolved. Na+ 40  Hypokalemia : Replaced and resolved. K+3.7  GERD: Continue pantoprazole.  Hypothyroidism: Continue levothyroxine.  Chronic normocytic anemia: Transfuse for Hb less than 7. There is no overt bleeding noted.   Continue to monitor for signs and symptoms of bleeding Hb- stable  Morbid obesity: BMI 62.07 Weight Loss and Dietary Counseling given .   DVT prophylaxis: Heparin Code Status: Full code. Family Communication: No family at bed side. Disposition Plan:   Status is: Inpatient  Remains inpatient appropriate because:Inpatient level of care appropriate due to severity of illness  Dispo: The patient is from: Home              Anticipated d/c is to:SNF ( [facility])              Patient currently is not medically stable to d/c.   Difficult to place patient No  Consultants:  None  Procedures:  None Antimicrobials:   Anti-infectives (From admission, onward)    None        Subjective: Patient was seen and examined at bedside.  Overnight events noted. Patient is alert and oriented x 3.  She is lying comfortably.  Mental status back to normal. She reports feeling better, she is awaiting placement in the [facility].  Objective: Vitals:   10/03/20 1839 10/03/20 2106 10/04/20 0445 10/04/20 1023  BP: (!) 148/76 (!) 151/85 (!) 165/85 (!) 162/93  Pulse: 72 67  69 78  Resp: _0 Temp: 99 F (37.2 C) 98.1 F (36.7 C) 98.9 F (37.2 C) 97.9 F (36.6 C)  TempSrc: Oral Oral Oral Oral  SpO2: 99% 100% 94% 98%  Weight:      Height:        Intake/Output Summary (Last 24 hours) at 10/04/2020 1055 Last data filed at 10/04/2020  0730 Gross per 24 hour  Intake 1037 ml  Output 1000 ml  Net 37 ml   Filed Weights   10/02/20 0400 10/02/20 2114 10/03/20 0447  Weight: (!) 169.2 kg (!) 169.2 kg (!) 169.1 kg    Examination:  General exam: Appears comfortable, alert and oriented,  not in any distress. Respiratory system: Clear to auscultation bilaterally Cardiovascular system: S1-S2 heard, regular rate and rhythm, no murmur  Gastrointestinal system: Abdomen is nondistended, soft and nontender. No organomegaly or masses felt. Normal bowel sounds heard. Central nervous system: Alert and oriented x 3. No focal neurological deficits. Extremities: 2+ pitting edema, no cyanosis, no clubbing. Skin: No rashes, lesions or ulcers Psychiatry: Judgement and insight appear normal. Mood & affect appropriate.     Data Reviewed: I have personally reviewed following labs and imaging studies  CBC: Recent Labs  Lab 09/29/20 1441 09/29/20 1452 09/30/20 0437 09/30/20 0447 10/01/20 0456 10/02/20 1231 10/03/20 0404  WBC 10.7*  --  8.8  --  7.8 9.2 9.0  NEUTROABS 8.2*  --   --   --   --  6.8 6.3  HGB 11.2*   < > 10.7* 10.9* 10.2* 10.6* 10.5*  HCT 34.7*   < > 32.4* 32.0* 31.2* 32.7* 32.1*  MCV 91.8  --  89.3  --  89.1 91.9 91.2  PLT 160  --  194  --  199 192 195   < > = values in this interval not displayed.   Basic Metabolic Panel: Recent Labs  Lab 09/29/20 1441 09/29/20 1452 09/30/20 0437 09/30/20 0447 09/30/20 1215 10/01/20 0456 10/02/20 0421 10/03/20 0404  NA 129*   < > 134* 134* 136 135 139 140  K 2.8*   < > 3.0* 3.0* 3.5 3.5 3.6 3.7  CL 91*   < > 93*  --  95* 97* 101 102  CO2 24  --  29  --  _1 GLUCOSE 229*   < > 221*  --  220* 188* 172* 171*  BUN 52*   < > 41*  --  34* 29* 24* 23  CREATININE 2.01*   < > 1.53*  --  1.43* 1.43* 1.24* 1.22*  CALCIUM 9.6  --  9.4  --  9.6 9.3 9.4 9.2  MG 2.2  --  2.1  --   --   --   --  2.3  PHOS  --   --  3.0  --   --   --   --  3.7   < > = values in this interval  not displayed.   GFR: Estimated Creatinine Clearance: 74.9 mL/min (A) (by C-G formula based on SCr of 1.22 mg/dL (H)). Liver Function Tests: Recent Labs  Lab 09/29/20 1441 09/30/20 0437 10/03/20 0404  AST 24 20 76*  ALT 20 20 90*  ALKPHOS 77 76 77  BILITOT 0.5 0.6 0.6  PROT 6.8 6.3* 6.3*  ALBUMIN 4.0 3.5 3.5   Recent Labs  Lab 09/30/20 0437  LIPASE 35  AMYLASE 49   No results for input(s): AMMONIA in the last 168  hours. Coagulation Profile: Recent Labs  Lab 09/30/20 0437  INR 1.1   Cardiac Enzymes: No results for input(s): CKTOTAL, CKMB, CKMBINDEX, TROPONINI in the last 168 hours. BNP (last 3 results) No results for input(s): PROBNP in the last 8760 hours. HbA1C: No results for input(s): HGBA1C in the last 72 hours. CBG: Recent Labs  Lab 10/03/20 1630 10/03/20 1944 10/03/20 2344 10/04/20 0437 10/04/20 0806  GLUCAP 164* 184* 142* 149* 186*   Lipid Profile: No results for input(s): CHOL, HDL, LDLCALC, TRIG, CHOLHDL, LDLDIRECT in the last 72 hours. Thyroid Function Tests: Recent Labs    10/03/20 0404  TSH 8.111*   Anemia Panel: Recent Labs    10/03/20 0404  VITAMINB12 2,250*  FOLATE 51.0  FERRITIN 89  TIBC 339  IRON 48  RETICCTPCT 2.3   Sepsis Labs: Recent Labs  Lab 09/29/20 1441 09/30/20 0437  PROCALCITON  --  0.41  LATICACIDVEN 1.6 1.9    Recent Results (from the past 240 hour(s))  MRSA Next Gen by PCR, Nasal     Status: Abnormal   Collection Time: 09/29/20  4:10 PM   Specimen: Nasal Mucosa; Nasal Swab  Result Value Ref Range Status   MRSA by PCR Next Gen DETECTED (A) NOT DETECTED Final    Comment: RESULT CALLED TO, READ BACK BY AND VERIFIED WITH: RN Windle Guard [phone] [phone] (NOTE) The GeneXpert MRSA Assay (FDA approved for NASAL specimens only), is one component of a comprehensive MRSA colonization surveillance program. It is not intended to diagnose MRSA infection nor to guide or monitor treatment for MRSA infections. Test  performance is not FDA approved in patients less than 63 years old. Performed at [facility], [place] [address], [place], [place] [address]   Culture, blood (routine x 2)     Status: None (Preliminary result)   Collection Time: 09/29/20  7:32 PM   Specimen: BLOOD  Result Value Ref Range Status   Specimen Description BLOOD RIGHT ARM  Final   Special Requests   Final    BOTTLES DRAWN AEROBIC ONLY Blood Culture results may not be optimal due to an inadequate volume of blood received in culture bottles   Culture   Final    NO GROWTH 4 DAYS Performed at [facility], [place] [address], [place], [place] [address]    Report Status PENDING  Incomplete  Culture, blood (routine x 2)     Status: None (Preliminary result)   Collection Time: 09/29/20  7:35 PM   Specimen: BLOOD  Result Value Ref Range Status   Specimen Description BLOOD LEFT HAND  Final   Special Requests   Final    BOTTLES DRAWN AEROBIC ONLY Blood Culture results may not be optimal due to an inadequate volume of blood received in culture bottles   Culture   Final    NO GROWTH 4 DAYS Performed at [facility], [place] [address] [place], [place] [address]    Report Status PENDING  Incomplete  Resp Panel by RT-PCR (Flu A&B, Covid)     Status: None   Collection Time: 09/30/20  4:37 AM  Result Value Ref Range Status   SARS Coronavirus 2 by RT PCR NEGATIVE NEGATIVE Final    Comment: (NOTE) SARS-CoV-2 target nucleic acids are NOT DETECTED.  The SARS-CoV-2 RNA is generally detectable in upper respiratory specimens during the acute phase of infection. The lowest concentration of SARS-CoV-2 viral copies this assay can detect is 138 copies/mL. A negative result does  not preclude SARS-Cov-2 infection and should not be used as the sole basis for treatment or other patient management decisions. A negative result may occur with  improper specimen collection/handling, submission of specimen other than nasopharyngeal swab,  presence of viral mutation(s) within the areas targeted by this assay, and inadequate number of viral copies(<138 copies/mL). A negative result must be combined with clinical observations, patient history, and epidemiological information. The expected result is Negative.  Fact Sheet for Patients:  EntrepreneurPulse.com.au  Fact Sheet for Healthcare Providers:  IncredibleEmployment.be  This test is no t yet approved or cleared by the [place] FDA and  has been authorized for detection and/or diagnosis of SARS-CoV-2 by FDA under an Emergency Use Authorization (EUA). This EUA will remain  in effect (meaning this test can be used) for the duration of the COVID-19 declaration under Section 564(b)(1) of the Act, 21 U.S.C.section 360bbb-3(b)(1), unless the authorization is terminated  or revoked sooner.       Influenza A by PCR NEGATIVE NEGATIVE Final   Influenza B by PCR NEGATIVE NEGATIVE Final    Comment: (NOTE) The Xpert Xpress SARS-CoV-2/FLU/RSV plus assay is intended as an aid in the diagnosis of influenza from Nasopharyngeal swab specimens and should not be used as a sole basis for treatment. Nasal washings and aspirates are unacceptable for Xpert Xpress SARS-CoV-2/FLU/RSV testing.  Fact Sheet for Patients: EntrepreneurPulse.com.au  Fact Sheet for Healthcare Providers: IncredibleEmployment.be  This test is not yet approved or cleared by the [place] FDA and has been authorized for detection and/or diagnosis of SARS-CoV-2 by FDA under an Emergency Use Authorization (EUA). This EUA will remain in effect (meaning this test can be used) for the duration of the COVID-19 declaration under Section 564(b)(1) of the Act, 21 U.S.C. section 360bbb-3(b)(1), unless the authorization is terminated or revoked.  Performed at [facility], [place] [address] [place], [place] [address]     Radiology  Studies: DG Shoulder Left  Result Date: 10/02/2020 CLINICAL DATA:  Left shoulder pain. EXAM: LEFT SHOULDER - 2+ VIEW COMPARISON:  None. FINDINGS: There is no evidence of an acute fracture or dislocation. Chronic changes are seen involving the left acromion. Mild degenerative changes are seen involving the left acromioclavicular joint with mild to moderate severity degenerative changes along the left glenohumeral articulation. A left-sided venous catheter is in place. Radiopaque surgical clips are seen overlying the superior mediastinum. Soft tissues are otherwise unremarkable. IMPRESSION: Chronic and degenerative changes without an acute osseous abnormality. Electronically Signed   By: Virgina Norfolk M.D.   On: 10/02/2020 19:24    Scheduled Meds:  aspirin EC  81 mg Oral Daily   Chlorhexidine Gluconate Cloth  6 each Topical Daily   diclofenac Sodium  2 g Topical QID   heparin injection (subcutaneous)  5,000 Units Subcutaneous Q8H   insulin aspart  0-20 Units Subcutaneous Q4H   insulin glargine-yfgn  5 Units Subcutaneous Daily   levothyroxine  224 mcg Oral Q0600   linaclotide  290 mcg Oral QAC breakfast   methocarbamol  500 mg Oral TID   mupirocin ointment   Nasal BID   pantoprazole  40 mg Oral BID AC   rosuvastatin  20 mg Oral Daily   sodium chloride flush  10-40 mL Intracatheter Q12H   venlafaxine XR  150 mg Oral BID   ziprasidone  40 mg Oral Daily   Continuous Infusions:  sodium chloride Stopped (09/30/20 1218)     LOS: 5 days    Time spent:  25 mins.    Shawna Clamp, MD [facility]   If 7PM-7AM, please contact night-coverage

## 2020-10-04 NOTE — Progress Notes (Signed)
Physical Therapy Treatment Patient Details Name: [name] MRN: [id number] DOB: [birth year] Today's Date: 10/04/2020    History of Present Illness 65 yo woman transported to ED on 8/13 by EMS due to decr responsiveness; overdose in pain meds, Narcan given;   has a past medical history including, but not limited to Anemia, Anxiety, Benign paroxysmal positional vertigo (12/30/2012), Breast cancer ([place]) (02/13/12),  CKD (chronic kidney disease), stage III ([place]),  Gait abnormality (05/29/2016),  HSV (herpes simplex virus) infection, radiation therapy (05/05/12- 07/15/12), Knee pain, bilateral, Memory difficulty (12/22/2017), OSA on CPAP, Pancreatitis, Pneumonia (1950's), Polyneuropathy in diabetes(357.2), Schizophrenia ([place]), Secondary parkinsonism ([place]) (12/30/2012), Tachycardia, Thyroid disease, and Type II diabetes mellitus ([place]).    PT Comments    Pt found slid down in bed, moaning in pain. Pt reports 10/10 back pain in her back. Pt agreeable to assist in scooting towards HoB. Bed placed in Trendelenburg and pt able to utilize L UE on headboard and bilateral flexed knees to scoot up in bed, PT assisted with pad scoot. HoB elevated and pt able to pull forward to longsitting x3  for placement of hot packs to low back. Pt refuses further PT due to pain. RN notified about request for pain medication. PT will try to follow back tomorrow for more complete treatment session.      Follow Up Recommendations  SNF     Equipment Recommendations  Wheelchair cushion (measurements PT)       Precautions / Restrictions Precautions Precautions: Fall Restrictions Weight Bearing Restrictions: No    Mobility  Bed Mobility Overal bed mobility: Needs Assistance             General bed mobility comments: minA for pad scoot towards HoB, bed placed in Trendelenberg, pt able to reach overhead with R arm to pull and flex knees to push up in bed           Cognition Arousal/Alertness:  Awake/alert Behavior During Therapy: WFL for tasks assessed/performed Overall Cognitive Status: Within Functional Limits for tasks assessed                                        Exercises  3x longsitting with HoB elevated and use of bedrails     General Comments General comments (skin integrity, edema, etc.): VSS on RA      Pertinent Vitals/Pain Pain Assessment: 0-10 Pain Score: 10-Worst pain ever Pain Location: back Pain Descriptors / Indicators: Discomfort;Grimacing;Guarding Pain Intervention(s): Limited activity within patient's tolerance;Monitored during session;Repositioned;Patient requesting pain meds-RN notified;Heat applied     PT Goals (current goals can now be found in the care plan section) Acute Rehab PT Goals Patient Stated Goal: to get stronger at rehab PT Goal Formulation: With patient Time For Goal Achievement: 10/16/20 Potential to Achieve Goals: Good    Frequency    Min 2X/week      PT Plan Current plan remains appropriate       AM-PAC PT "6 Clicks" Mobility   Outcome Measure  Help needed turning from your back to your side while in a flat bed without using bedrails?: A Little Help needed moving from lying on your back to sitting on the side of a flat bed without using bedrails?: A Little Help needed moving to and from a bed to a chair (including a wheelchair)?: A Little Help needed standing up from a chair using your arms (e.g., wheelchair or  bedside chair)?: A Little Help needed to walk in hospital room?: A Lot Help needed climbing 3-5 steps with a railing? : Total 6 Click Score: 15    End of Session   Activity Tolerance: Patient limited by pain Patient left: in bed;with bed alarm set;with call bell/phone within reach Nurse Communication: Mobility status;Patient requests pain meds PT Visit Diagnosis: Unsteadiness on feet (R26.81);Other abnormalities of gait and mobility (R26.89);Pain Pain - Right/Left: Right Pain - part of  body: Ankle and joints of foot (and back pain at midline)     Time: 5301-0404 PT Time Calculation (min) (ACUTE ONLY): 14 min  Charges:  $Therapeutic Activity: 8-22 mins                     Kaitlynd Phillips B. Migdalia Dk PT, DPT Acute Rehabilitation Services Pager [phone] Office [phone]    [place] 10/04/2020, 3:19 PM

## 2020-10-04 NOTE — Progress Notes (Signed)
Pt requiring encouragement to participate. Min guard assist for supine to sit and transfers, total assist for pericare and mod assist to return to supine. Pt declined up in chair.    10/04/20 1549  OT Visit Information  Last OT Received On 10/04/20  Assistance Needed +1  History of Present Illness 65 yo woman transported to ED on 8/13 by EMS due to decr responsiveness; overdose in pain meds, Narcan given;   has a past medical history including, but not limited to Anemia, Anxiety, Benign paroxysmal positional vertigo (12/30/2012), Breast cancer ([place]) (02/13/12),  CKD (chronic kidney disease), stage III ([place]),  Gait abnormality (05/29/2016),  HSV (herpes simplex virus) infection, radiation therapy (05/05/12- 07/15/12), Knee pain, bilateral, Memory difficulty (12/22/2017), OSA on CPAP, Pancreatitis, Pneumonia (1950's), Polyneuropathy in diabetes(357.2), Schizophrenia ([place]), Secondary parkinsonism ([place]) (12/30/2012), Tachycardia, Thyroid disease, and Type II diabetes mellitus ([place]).  Precautions  Precautions Fall  Pain Assessment  Pain Assessment Faces  Pain Location R leg  Pain Descriptors / Indicators Discomfort;Guarding;Grimacing  Pain Intervention(s) Monitored during session;Repositioned  Cognition  Arousal/Alertness Awake/alert  Behavior During Therapy Flat affect  Overall Cognitive Status Within Functional Limits for tasks assessed  ADL  Overall ADL's  Needs assistance/impaired  Toilet Transfer Min guard;Squat-pivot;BSC  Toileting- Clothing Manipulation and Hygiene Total assistance;Sit to/from stand  Bed Mobility  Overal bed mobility Needs Assistance  Bed Mobility Supine to Sit;Sit to Supine  Supine to sit Min guard  Sit to supine Mod assist  General bed mobility comments assist for LEs into bed, +use of rail  Balance  Overall balance assessment Needs assistance  Sitting balance-Leahy Scale Fair  Standing balance-Leahy Scale Poor  Standing balance comment relies on BUE support   Transfers  Transfers Sit to/from Stand  Sit to Stand Min guard  General transfer comment cues for hand placement  OT - End of Session  Equipment Utilized During Treatment Rolling walker  Activity Tolerance Patient limited by pain  Patient left in bed;with call bell/phone within reach;with bed alarm set  OT Assessment/Plan  OT Plan Discharge plan remains appropriate  OT Visit Diagnosis Other abnormalities of gait and mobility (R26.89);Pain;Muscle weakness (generalized) (M62.81);History of falling (Z91.81)  OT Frequency (ACUTE ONLY) Min 2X/week  Follow Up Recommendations SNF;Supervision/Assistance - 24 hour  OT Equipment None recommended by OT  AM-PAC OT "6 Clicks" Daily Activity Outcome Measure (Version 2)  Help from another person eating meals? 4  Help from another person taking care of personal grooming? 3  Help from another person toileting, which includes using toliet, bedpan, or urinal? 1  Help from another person bathing (including washing, rinsing, drying)? 2  Help from another person to put on and taking off regular upper body clothing? 3  Help from another person to put on and taking off regular lower body clothing? 1  6 Click Score 14  Progressive Mobility  What is the highest level of mobility based on the progressive mobility assessment? Level 3 (Stands with assist) - Balance while standing  and cannot march in place  Mobility Out of bed for toileting  OT Goal Progression  Progress towards OT goals Progressing toward goals  Acute Rehab OT Goals  Patient Stated Goal to get stronger at rehab  OT Goal Formulation With patient  Time For Goal Achievement 10/16/20  Potential to Achieve Goals Good  OT Time Calculation  OT Start Time (ACUTE ONLY) 1404  OT Stop Time (ACUTE ONLY) 1425  OT Time Calculation (min) 21 min  OT General Charges  $OT Visit  1 Visit  OT Treatments  $Self Care/Home Management  8-22 mins  Nestor Lewandowsky, OTR/L Acute Rehabilitation Services Pager:  [phone] Office: [phone]

## 2020-10-04 NOTE — TOC Progression Note (Addendum)
Transition of Care [facility]) - Progression Note    Patient Details  Name: [name] MRN: [id number] Date of Birth: 11/25/1955  Transition of Care [facility]) CM/SW Contact  Sharlet Salina Mila Homer, LCSW Phone Number: 10/04/2020, 12:37 PM  Clinical Narrative:  CSW received a call from [place] with [facility] regarding patient and their denial in Epic HUB due to cost of care. She checked with someone else at the facility and they cannot accept patient. Visited with patient and informed her of [facility]'s denial. Discussed with her sending her information out county-wide in an attempt to get other SNF's that may accept her and Ms. Toney Rakes gave consent.   Patient reported that she lives at home alone and has 2 aides that come 7 days a week, 2 1/2 hours per day. One aide comes Mon-Fri and one comes on the weekend. They assist with cooking, bathing and dressing. Ms. Luther explained that she cannot walk due to a right foot injury that is very painful. She gets around using her wheelchair and can transfer from the bed to the wheelchair, which she keeps at bedside. When asked, Ms. Venturini responded that PT has worked with her once since being in the hospital.  Ms. Spark advised that she will be informed of facility responses and will be provided with the Medicare.gov SNF list.    3:18 pm: Provided patient with Medicare.gov SNF list which noted the facility that made a bed offer: [facility] and the SNF's that declined and why. Ms. Woolworth responded that she does not want [facility], however she is aware that this is her only choice at this time. Patient informed that CSW will follow-up.       Expected Discharge Plan: [place] Barriers to Discharge: Continued Medical Work up  Expected Discharge Plan and Services Expected Discharge Plan: [place] In-house Referral: Clinical Social Work     Living arrangements for the past 2 months: Single Family Home                                        Social Determinants of Health (SDOH) Interventions  No SDOH interventions requested or needed at this time.  Readmission Risk Interventions No flowsheet data found.

## 2020-10-04 NOTE — Plan of Care (Signed)
  Problem: Education: Goal: Knowledge of General Education information will improve Description: Including pain rating scale, medication(s)/side effects and non-pharmacologic comfort measures Outcome: Progressing   Problem: Clinical Measurements: Goal: Ability to maintain clinical measurements within normal limits will improve Outcome: Progressing

## 2020-10-05 LAB — BASIC METABOLIC PANEL
Anion gap: 8 (ref 5–15)
BUN: 18 mg/dL (ref 8–23)
CO2: 27 mmol/L (ref 22–32)
Calcium: 8.8 mg/dL — ABNORMAL LOW (ref 8.9–10.3)
Chloride: 100 mmol/L (ref 98–111)
Creatinine, Ser: 1.13 mg/dL — ABNORMAL HIGH (ref 0.44–1.00)
GFR, Estimated: 54 mL/min — ABNORMAL LOW (ref >60)
Glucose, Bld: 138 mg/dL — ABNORMAL HIGH (ref 70–99)
Potassium: 3.6 mmol/L (ref 3.5–5.1)
Sodium: 135 mmol/L (ref 135–145)

## 2020-10-05 LAB — RESP PANEL BY RT-PCR (FLU A&B, COVID) ARPGX2
Influenza A by PCR: NEGATIVE
Influenza B by PCR: NEGATIVE
SARS Coronavirus 2 by RT PCR: NEGATIVE

## 2020-10-05 LAB — CBC
HCT: 30.3 % — ABNORMAL LOW (ref 36.0–46.0)
Hemoglobin: 10.1 g/dL — ABNORMAL LOW (ref 12.0–15.0)
MCH: 29.7 pg (ref 26.0–34.0)
MCHC: 33.3 g/dL (ref 30.0–36.0)
MCV: 89.1 fL (ref 80.0–100.0)
Platelets: 173 10*3/uL (ref 150–400)
RBC: 3.4 MIL/uL — ABNORMAL LOW (ref 3.87–5.11)
RDW: 14.7 % (ref 11.5–15.5)
WBC: 9.5 10*3/uL (ref 4.0–10.5)
nRBC: 0 % (ref 0.0–0.2)

## 2020-10-05 LAB — MAGNESIUM: Magnesium: 2.2 mg/dL (ref 1.7–2.4)

## 2020-10-05 LAB — GLUCOSE, CAPILLARY
Glucose-Capillary: 120 mg/dL — ABNORMAL HIGH (ref 70–99)
Glucose-Capillary: 123 mg/dL — ABNORMAL HIGH (ref 70–99)
Glucose-Capillary: 125 mg/dL — ABNORMAL HIGH (ref 70–99)
Glucose-Capillary: 132 mg/dL — ABNORMAL HIGH (ref 70–99)
Glucose-Capillary: 145 mg/dL — ABNORMAL HIGH (ref 70–99)
Glucose-Capillary: 146 mg/dL — ABNORMAL HIGH (ref 70–99)

## 2020-10-05 LAB — PHOSPHORUS: Phosphorus: 3.7 mg/dL (ref 2.5–4.6)

## 2020-10-05 MED ORDER — AMLODIPINE BESYLATE 5 MG PO TABS
5.0000 mg | ORAL_TABLET | Freq: Every day | ORAL | Status: DC
  Administered 2020-10-05: 5 mg via ORAL
  Filled 2020-10-05: qty 1

## 2020-10-05 NOTE — Progress Notes (Signed)
PROGRESS NOTE    [name]  GQQ:[id number] DOB: 07/24/1955 DOA: 09/29/2020 PCP: Fredrich Romans, PA   Brief Narrative: This 65 years old morbidly obese female with plethora of health issues including anemia, anxiety, asthma, BPPV, history of breast cancer, chronic back pain, depression, GERD, history of hiatal hernia and, stomach ulcers, HCV infection, hypothyroidism, nephrolithiasis, OSA, history of polyneuropathy and secondary parkinsonism as well as type 2 diabetes and other comorbidities presented after she was found poorly responsive.  Patient was brought in the ED,  She was given Narcan in route and twice in the ED she had improvement in her respirations and was admitted in the ICU without being intubated.  Her narcotics were held and she was admitted for possible overdose.  She was continued on Narcan drip and now she is titrated off.  CT head was done which ruled out any organic cause .  Subsequently she improved and became alert and oriented. Patient was admitted for altered mentation probably secondary to polypharmacy, now back to her baseline mental status.  Assessment & Plan:   Active Problems:   AMS (altered mental status)   Acute metabolic encephalopathy in the setting of presumed hypercarbic respiratory failure with questionable drug overdose> Resolved. Known obesity-hypoventilation syndrome and OSA and COPD. Patient presented poorly responsive.  She was admitted in ICU and started on Narcan infusion/ suspected opoid overdose. Narcan infusion d/c'd 8/15.  Patient is now back to her baseline mental status. Encouraged increased mobility. PT/OT recommending SNF Continue CPAP at night. Continue with delirium precautions Avoid sedating medications.   Chronic pain syndrome: She is resumed on home Flexeril, Robaxin and oxycodone. Lyrica, tizanidine has not started yet.  Will resume depends how she feels.  Schizophrenia paranoid: Anxiety and depression: Continue Geodon 60  mg daily, venlafaxine 150 mg twice daily Continue lorazepam 0.5 mg every 8 hours as needed for anxiety  AKI on CKD 3b: Renal functions back to baseline. Continue to monitor renal functions,  avoid nephrotoxic medications  Diabetes mellitus : Continue sliding scale. Continue Semglee 5 units subcu.daily.  Hyponatremia: Replaced and resolved.   Hypokalemia : Replaced and resolved.   GERD: Continue pantoprazole.  Hypothyroidism: Continue levothyroxine.  Chronic normocytic anemia: Transfuse for Hb less than 7. There is no overt bleeding noted.   Hb- stable > 10.1  Morbid obesity: BMI 62.07 Weight Loss and Dietary Counseling given .   DVT prophylaxis: Heparin Code Status: Full code. Family Communication: No family at bed side. Disposition Plan:   Status is: Inpatient  Remains inpatient appropriate because:Inpatient level of care appropriate due to severity of illness  Dispo: The patient is from: Home              Anticipated d/c is to:SNF ( [facility] [facility])              Patient currently medically clear for d/c   Difficult to place patient No  Consultants:  None  Procedures:  None Antimicrobials:   Anti-infectives (From admission, onward)    None        Subjective: Patient was seen and examined at bedside.  Overnight events noted. Patient is alert and oriented x 3.  She is lying comfortably in the bed.   Mental status back to normal. She complains of having pain in the right teeth.  Awaiting SNF placement.   Objective: Vitals:   10/04/20 1023 10/04/20 1648 10/04/20 2054 10/05/20 0942  BP: (!) 162/93 (!) 167/96 (!) 174/102 (!) 158/82  Pulse: 78 72 72  72  Resp: _0 Temp: 97.9 F (36.6 C) 99.1 F (37.3 C) 98.7 F (37.1 C) 98.5 F (36.9 C)  TempSrc: Oral Oral Oral   SpO2: 98% 95% 97% 98%  Weight:      Height:        Intake/Output Summary (Last 24 hours) at 10/05/2020 1109 Last data filed at 10/05/2020 1051 Gross per 24 hour  Intake 1030  ml  Output 800 ml  Net 230 ml   Filed Weights   10/02/20 0400 10/02/20 2114 10/03/20 0447  Weight: (!) 169.2 kg (!) 169.2 kg (!) 169.1 kg    Examination:  General exam: Alert and oriented x 3, appears comfortable, not in any distress. Respiratory system: Clear to auscultation bilaterally, no wheezing Cardiovascular system: S1-S2 heard, regular rate and rhythm, no murmur. Gastrointestinal system: Abdomen is soft, nontender, nondistended, BS positive  Central nervous system: Alert and oriented x3, no focal neurological deficits.   Extremities: 2+ pitting edema, no cyanosis, no clubbing. Skin: No rash. Psychiatry: Mood and affect appropriate.    Data Reviewed: I have personally reviewed following labs and imaging studies  CBC: Recent Labs  Lab 09/29/20 1441 09/29/20 1452 09/30/20 0437 09/30/20 0447 10/01/20 0456 10/02/20 1231 10/03/20 0404 10/05/20 0407  WBC 10.7*  --  8.8  --  7.8 9.2 9.0 9.5  NEUTROABS 8.2*  --   --   --   --  6.8 6.3  --   HGB 11.2*   < > 10.7* 10.9* 10.2* 10.6* 10.5* 10.1*  HCT 34.7*   < > 32.4* 32.0* 31.2* 32.7* 32.1* 30.3*  MCV 91.8  --  89.3  --  89.1 91.9 91.2 89.1  PLT 160  --  194  --  199 192 195 173   < > = values in this interval not displayed.   Basic Metabolic Panel: Recent Labs  Lab 09/29/20 1441 09/29/20 1452 09/30/20 0437 09/30/20 0447 09/30/20 1215 10/01/20 0456 10/02/20 0421 10/03/20 0404 10/05/20 0407  NA 129*   < > 134*   < > 136 135 139 140 135  K 2.8*   < > 3.0*   < > 3.5 3.5 3.6 3.7 3.6  CL 91*   < > 93*  --  95* 97* 101 102 100  CO2 24  --  29  --  _1 GLUCOSE 229*   < > 221*  --  220* 188* 172* 171* 138*  BUN 52*   < > 41*  --  34* 29* 24* 23 18  CREATININE 2.01*   < > 1.53*  --  1.43* 1.43* 1.24* 1.22* 1.13*  CALCIUM 9.6  --  9.4  --  9.6 9.3 9.4 9.2 8.8*  MG 2.2  --  2.1  --   --   --   --  2.3 2.2  PHOS  --   --  3.0  --   --   --   --  3.7 3.7   < > = values in this interval not displayed.    GFR: Estimated Creatinine Clearance: 80.8 mL/min (A) (by C-G formula based on SCr of 1.13 mg/dL (H)). Liver Function Tests: Recent Labs  Lab 09/29/20 1441 09/30/20 0437 10/03/20 0404  AST 24 20 76*  ALT 20 20 90*  ALKPHOS 77 76 77  BILITOT 0.5 0.6 0.6  PROT 6.8 6.3* 6.3*  ALBUMIN 4.0 3.5 3.5   Recent Labs  Lab 09/30/20 0437  LIPASE 35  AMYLASE 49   No results for input(s): AMMONIA in the last 168 hours. Coagulation Profile: Recent Labs  Lab 09/30/20 0437  INR 1.1   Cardiac Enzymes: No results for input(s): CKTOTAL, CKMB, CKMBINDEX, TROPONINI in the last 168 hours. BNP (last 3 results) No results for input(s): PROBNP in the last 8760 hours. HbA1C: No results for input(s): HGBA1C in the last 72 hours. CBG: Recent Labs  Lab 10/04/20 1646 10/04/20 2033 10/05/20 0011 10/05/20 0501 10/05/20 0821  GLUCAP 143* 132* 132* 123* 145*   Lipid Profile: No results for input(s): CHOL, HDL, LDLCALC, TRIG, CHOLHDL, LDLDIRECT in the last 72 hours. Thyroid Function Tests: Recent Labs    10/03/20 0404  TSH 8.111*   Anemia Panel: Recent Labs    10/03/20 0404  VITAMINB12 2,250*  FOLATE 51.0  FERRITIN 89  TIBC 339  IRON 48  RETICCTPCT 2.3   Sepsis Labs: Recent Labs  Lab 09/29/20 1441 09/30/20 0437  PROCALCITON  --  0.41  LATICACIDVEN 1.6 1.9    Recent Results (from the past 240 hour(s))  MRSA Next Gen by PCR, Nasal     Status: Abnormal   Collection Time: 09/29/20  4:10 PM   Specimen: Nasal Mucosa; Nasal Swab  Result Value Ref Range Status   MRSA by PCR Next Gen DETECTED (A) NOT DETECTED Final    Comment: RESULT CALLED TO, READ BACK BY AND VERIFIED WITH: RN Windle Guard [phone] 1958 MLM (NOTE) The GeneXpert MRSA Assay (FDA approved for NASAL specimens only), is one component of a comprehensive MRSA colonization surveillance program. It is not intended to diagnose MRSA infection nor to guide or monitor treatment for MRSA infections. Test performance is not FDA  approved in patients less than 72 years old. Performed at [facility], [place] [address] [place], [place] [address]   Culture, blood (routine x 2)     Status: None   Collection Time: 09/29/20  7:32 PM   Specimen: BLOOD  Result Value Ref Range Status   Specimen Description BLOOD RIGHT ARM  Final   Special Requests   Final    BOTTLES DRAWN AEROBIC ONLY Blood Culture results may not be optimal due to an inadequate volume of blood received in culture bottles   Culture   Final    NO GROWTH 5 DAYS Performed at [facility], [place] [address], [place], [place] [address]    Report Status 10/04/2020 FINAL  Final  Culture, blood (routine x 2)     Status: None   Collection Time: 09/29/20  7:35 PM   Specimen: BLOOD  Result Value Ref Range Status   Specimen Description BLOOD LEFT HAND  Final   Special Requests   Final    BOTTLES DRAWN AEROBIC ONLY Blood Culture results may not be optimal due to an inadequate volume of blood received in culture bottles   Culture   Final    NO GROWTH 5 DAYS Performed at [facility], [place] [address], [place], [place] [address]    Report Status 10/04/2020 FINAL  Final  Resp Panel by RT-PCR (Flu A&B, Covid)     Status: None   Collection Time: 09/30/20  4:37 AM  Result Value Ref Range Status   SARS Coronavirus 2 by RT PCR NEGATIVE NEGATIVE Final    Comment: (NOTE) SARS-CoV-2 target nucleic acids are NOT DETECTED.  The SARS-CoV-2 RNA is generally detectable in upper respiratory specimens during the acute phase of infection. The lowest concentration of SARS-CoV-2 viral copies  this assay can detect is 138 copies/mL. A negative result does not preclude SARS-Cov-2 infection and should not be used as the sole basis for treatment or other patient management decisions. A negative result may occur with  improper specimen collection/handling, submission of specimen other than nasopharyngeal swab, presence of viral mutation(s) within the areas targeted by  this assay, and inadequate number of viral copies(<138 copies/mL). A negative result must be combined with clinical observations, patient history, and epidemiological information. The expected result is Negative.  Fact Sheet for Patients:  EntrepreneurPulse.com.au  Fact Sheet for Healthcare Providers:  IncredibleEmployment.be  This test is no t yet approved or cleared by the [place] FDA and  has been authorized for detection and/or diagnosis of SARS-CoV-2 by FDA under an Emergency Use Authorization (EUA). This EUA will remain  in effect (meaning this test can be used) for the duration of the COVID-19 declaration under Section 564(b)(1) of the Act, 21 U.S.C.section 360bbb-3(b)(1), unless the authorization is terminated  or revoked sooner.       Influenza A by PCR NEGATIVE NEGATIVE Final   Influenza B by PCR NEGATIVE NEGATIVE Final    Comment: (NOTE) The Xpert Xpress SARS-CoV-2/FLU/RSV plus assay is intended as an aid in the diagnosis of influenza from Nasopharyngeal swab specimens and should not be used as a sole basis for treatment. Nasal washings and aspirates are unacceptable for Xpert Xpress SARS-CoV-2/FLU/RSV testing.  Fact Sheet for Patients: EntrepreneurPulse.com.au  Fact Sheet for Healthcare Providers: IncredibleEmployment.be  This test is not yet approved or cleared by the [place] FDA and has been authorized for detection and/or diagnosis of SARS-CoV-2 by FDA under an Emergency Use Authorization (EUA). This EUA will remain in effect (meaning this test can be used) for the duration of the COVID-19 declaration under Section 564(b)(1) of the Act, 21 U.S.C. section 360bbb-3(b)(1), unless the authorization is terminated or revoked.  Performed at [facility], [place] [address], [place], [place] [address]     Radiology Studies: No results found.  Scheduled Meds:  amLODipine  5 mg  Oral Daily   aspirin EC  81 mg Oral Daily   Chlorhexidine Gluconate Cloth  6 each Topical Daily   diclofenac Sodium  2 g Topical QID   heparin injection (subcutaneous)  5,000 Units Subcutaneous Q8H   insulin aspart  0-20 Units Subcutaneous Q4H   insulin glargine-yfgn  5 Units Subcutaneous Daily   levothyroxine  224 mcg Oral Q0600   linaclotide  290 mcg Oral QAC breakfast   methocarbamol  500 mg Oral TID   mupirocin ointment   Nasal BID   pantoprazole  40 mg Oral BID AC   rosuvastatin  20 mg Oral Daily   sodium chloride flush  10-40 mL Intracatheter Q12H   venlafaxine XR  150 mg Oral BID   ziprasidone  40 mg Oral Daily   Continuous Infusions:  sodium chloride Stopped (09/30/20 1218)     LOS: 6 days    Time spent: 25 mins.    Shawna Clamp, MD [facility]   If 7PM-7AM, please contact night-coverage

## 2020-10-05 NOTE — TOC Progression Note (Signed)
Transition of Care [facility]) - Progression Note    Patient Details  Name: [name] MRN: [id number] Date of Birth: Dec 03, 1955  [facility]) CM/SW Contact  Sharlet Salina Mila Homer, LCSW Phone Number: 10/05/2020, 5:46 PM  Clinical Narrative: Visited with patient this morning and again this afternoon to discuss her d/c disposition. Informed Ms. Koopman that [facility] remained the sole facility that could take her. Patient remained hesitant due to things she had heard about the facility and this was discussed. CSW expressed concerns regarding her mobility, based on PT/OT notes and what she had previously share with CSW concerning her baseline mobility at home - unable to ambulate, able to transfer to her wheelchair from the bed, and able to use the bathroom and aide services 2 1/2 hours per day, 7 days per week. Patient eventually agreed to SNF. Submitted for authorization via [employer] and authorization received: Olene Floss ID # [id number], eff 8/19-8/23. Next review date 8/23.  Visited with patient this afternoon and talked extensively with her regarding SNF placement as she vacillated between going to [facility] and going home. CSW expressed concern as she is currently not back at her baseline and this was discussed. Contacted Kathy during visit with patient and was informed that she would need to have her CPAP and nasal pillows brought to her at the facility. Ms. Lenoir also contacted her son [name] and daughter Danielle Dess, and Purvis talked with them and shared concerns regarding patient going home. Son and daughter agreeable to SNF placement and was informed that patient would discharge on Saturday.   Contacted Juliann Pulse and informed her of patient's decision to d/c to [facility]. Informed her that patient is fully vaccinated (3/22, 4/19, 01/20/20) and had her booster on 09/27/20.  MD contacted and informed regarding insurance authorization and Saturday discharge (patient swabbed for  COVID Friday afternoon).      Expected Discharge Plan: [place] Barriers to Discharge: Continued Medical Work up  Expected Discharge Plan and Services Expected Discharge Plan: [place] In-house Referral: Clinical Social Work     Living arrangements for the past 2 months: Single Family Home                                       Social Determinants of Health (SDOH) Interventions    Readmission Risk Interventions No flowsheet data found.

## 2020-10-06 LAB — GLUCOSE, CAPILLARY
Glucose-Capillary: 128 mg/dL — ABNORMAL HIGH (ref 70–99)
Glucose-Capillary: 129 mg/dL — ABNORMAL HIGH (ref 70–99)
Glucose-Capillary: 133 mg/dL — ABNORMAL HIGH (ref 70–99)
Glucose-Capillary: 144 mg/dL — ABNORMAL HIGH (ref 70–99)

## 2020-10-06 MED ORDER — AMLODIPINE BESYLATE 10 MG PO TABS
10.0000 mg | ORAL_TABLET | Freq: Every day | ORAL | Status: DC
  Administered 2020-10-06: 10 mg via ORAL
  Filled 2020-10-06: qty 1

## 2020-10-06 MED ORDER — LORAZEPAM 0.5 MG PO TABS: 0.5000 mg | ORAL_TABLET | Freq: Three times a day (TID) | ORAL | 0 refills | Status: DC | PRN

## 2020-10-06 MED ORDER — OXYCODONE HCL 15 MG PO TABS: 15.0000 mg | ORAL_TABLET | Freq: Four times a day (QID) | ORAL | 0 refills | Status: DC | PRN

## 2020-10-06 MED ORDER — AMLODIPINE BESYLATE 10 MG PO TABS: 10.0000 mg | ORAL_TABLET | Freq: Every day | ORAL | 1 refills | Status: DC

## 2020-10-06 MED ORDER — LEVOTHYROXINE SODIUM 112 MCG PO TABS: 224.0000 ug | ORAL_TABLET | Freq: Every day | ORAL | 1 refills | Status: DC

## 2020-10-06 NOTE — TOC Transition Note (Signed)
Transition of Care [facility]) - CM/SW Discharge Note   Patient Details  Name: [name] MRN: [id number] Date of Birth: [birth year]  [facility]) CM/SW Contact:  Coralee Pesa, [place] Phone Number: 10/06/2020, 9:59 AM   Clinical Narrative:    Pt to be transferred to [facility] via [place].  Nurse to call report to [phone].   Final next level of care: Skilled Nursing Facility Barriers to Discharge: Barriers Resolved   Patient Goals and CMS Choice Patient states their goals for this hospitalization and ongoing recovery are:: to go to SNF CMS Medicare.gov Compare Post Acute Care list provided to:: Patient Choice offered to / list presented to : Patient  Discharge Placement              Patient chooses bed at: [facility] Patient to be transferred to facility by: [place] Name of family member notified: Christiane Patient and family notified of of transfer: 10/06/20  Discharge Plan and Services In-house Referral: Clinical Social Work                                   Social Determinants of Health ([place]) Interventions     Readmission Risk Interventions No flowsheet data found.

## 2020-10-06 NOTE — Plan of Care (Signed)

## 2020-10-06 NOTE — Discharge Instructions (Signed)
Advised to follow-up with PCP in 1 week. Advised to take amlodipine 10 mg daily for blood pressure control. Advised to minimize opioids and narcotics for pain control

## 2020-10-06 NOTE — Discharge Summary (Addendum)
Physician Discharge Summary  [name] ZDG:[id number] DOB: 04-20-1955 DOA: 09/29/2020  PCP: Fredrich Romans, PA  Admit date: 09/29/2020  Discharge date: 10/06/2020  Admitted From: SNF  Disposition:  SNF  Recommendations for Outpatient Follow-up:  Follow up with PCP in 1-2 weeks. Please obtain BMP/CBC in one week. Advised to take amlodipine 10 mg daily for hypertension. Advised to minimize opioids and narcotics for pain control. Advised to minimize benzodiazepines for anxiety.  Home Health: None Equipment/Devices:None  Discharge Condition: Good CODE STATUS:Full code Diet recommendation: Heart Healthy  Brief Summary / Hospital course: This 65 years old morbidly obese female with plethora of health issues including anemia, anxiety disorder, asthma, BPPV, history of breast cancer, chronic back pain, depression, GERD, history of hiatal hernia and, stomach ulcers, HCV infection, hypothyroidism, nephrolithiasis, OSA, history of polyneuropathy and secondary parkinsonism as well as type 2 diabetes and other comorbidities presented after she was found poorly responsive.  Patient was brought in the ED,  She was given Narcan in route and twice in the ED.  She had improvement in her respirations and was admitted in the ICU without being intubated.  Her narcotics were held and she was admitted for possible overdose.  She was continued on Narcan drip and now she is titrated off.  CT head was done which ruled out any organic cause .  Subsequently she improved and became alert and oriented.  She was transferred to medical floor. Patient was admitted for altered mentation probably secondary to polypharmacy, now back to her baseline mental status.  She was slowly resumed on her pain and anxiety medications.  She is now back to her baseline mental status.  Renal functions remains at baseline.  Electrolytes improved.  PT recommended skilled nursing facility patient is being discharged to [facility] for  rehab.  She was managed for below problems.   Discharge Diagnoses:  Active Problems:   AMS (altered mental status)  Acute metabolic encephalopathy in the setting of presumed hypercarbic respiratory failure with questionable drug overdose> Resolved. Known obesity-hypoventilation syndrome and OSA and COPD. Patient presented poorly responsive.  She was admitted in ICU and started on Narcan infusion/ suspected opoid overdose. Narcan infusion d/c'd 8/15.  Patient is now back to her baseline mental status. Encouraged increased mobility. PT/OT recommending SNF Continue CPAP at night. Continue with delirium precautions Avoid sedating medications.   Chronic pain syndrome: She is resumed on home Flexeril, Robaxin and oxycodone. Lyrica, tizanidine has not started yet.  Resumed at discharge.   Schizophrenia paranoid: Anxiety and depression: Continue Geodon 60 mg daily, venlafaxine 150 mg twice daily Continue lorazepam 0.5 mg every 8 hours as needed for anxiety   AKI on CKD 3b: Renal functions back to baseline. Continue to monitor renal functions,  avoid nephrotoxic medications   Diabetes mellitus : Continue sliding scale. Continue Semglee 5 units subcu.daily.   Hyponatremia: Replaced and resolved.    Hypokalemia : Replaced and resolved.    GERD: Continue pantoprazole.   Hypothyroidism: Continue levothyroxine.   Chronic normocytic anemia: Transfuse for Hb less than 7. There is no overt bleeding noted.   Hb- stable > 10.1   Morbid obesity: BMI 62.07 Weight Loss and Dietary Counseling given .  Discharge Instructions  Discharge Instructions     Call MD for:  difficulty breathing, headache or visual disturbances   Complete by: As directed    Call MD for:  persistant dizziness or light-headedness   Complete by: As directed    Call MD for:  severe  uncontrolled pain   Complete by: As directed    Diet - low sodium heart healthy   Complete by: As directed    Diet Carb  Modified   Complete by: As directed    Discharge instructions   Complete by: As directed    Advised to follow-up with primary care physician in 1 week. Advised to avoid opiates and narcotics for pain control.   Increase activity slowly   Complete by: As directed       Allergies as of 10/06/2020       Reactions   Bee Venom Anaphylaxis   Invokana [canagliflozin] Anaphylaxis   Dyspnea and urinary retention   Shellfish Allergy Anaphylaxis   Buprenorphine Hcl Other (See Comments)   Overly sedated with morphine drip.   Other Other (See Comments)   Cats - bad asthma, stopped up        Medication List     TAKE these medications    albuterol 108 (90 Base) MCG/ACT inhaler Commonly known as: VENTOLIN HFA Inhale 2 puffs into the lungs every 4 (four) hours as needed for wheezing or shortness of breath.   amLODipine 10 MG tablet Commonly known as: NORVASC Take 1 tablet (10 mg total) by mouth daily. Start taking on: October 07, 2020   aspirin EC 81 MG tablet Take 1 tablet (81 mg total) by mouth daily.   B-complex with vitamin C tablet Take 1 tablet by mouth daily.   calcitRIOL 0.25 MCG capsule Commonly known as: ROCALTROL Take 1 capsule (0.25 mcg total) by mouth 3 (three) times a week.   cyclobenzaprine 10 MG tablet Commonly known as: FLEXERIL Take 10 mg by mouth at bedtime as needed for muscle spasms.   doxepin 50 MG capsule Commonly known as: SINEQUAN Take 50-100 mg by mouth at bedtime as needed for sleep.   esomeprazole 40 MG capsule Commonly known as: NEXIUM Take 1 capsule (40 mg total) by mouth daily before breakfast. What changed: when to take this   famotidine 40 MG tablet Commonly known as: PEPCID Take 80 mg by mouth daily as needed for indigestion.   furosemide 20 MG tablet Commonly known as: LASIX Take 2 tablets (40 mg total) by mouth daily.   GERITOL PO Take 1 tablet by mouth daily.   HumuLIN N KwikPen 100 UNIT/ML Kiwkpen Generic drug: Insulin NPH  (Human) (Isophane) Inject 34 Units into the skin every morning. And pen needles 1/day   levothyroxine 112 MCG tablet Commonly known as: SYNTHROID Take 2 tablets (224 mcg total) by mouth daily at 6 (six) AM. Start taking on: October 07, 2020 What changed:  when to take this Another medication with the same name was removed. Continue taking this medication, and follow the directions you see here.   linaclotide 290 MCG Caps capsule Commonly known as: LINZESS Take 290 mcg by mouth daily before breakfast.   LORazepam 0.5 MG tablet Commonly known as: ATIVAN Take 1 tablet (0.5 mg total) by mouth every 8 (eight) hours as needed for anxiety. What changed:  medication strength how much to take when to take this   losartan-hydrochlorothiazide 100-25 MG tablet Commonly known as: HYZAAR Take 1 tablet by mouth daily.   lubiprostone 24 MCG capsule Commonly known as: AMITIZA Take 24 mcg by mouth 2 (two) times daily.   methocarbamol 750 MG tablet Commonly known as: ROBAXIN Take 750 mg by mouth 3 (three) times daily as needed for muscle spasms.   multivitamin with minerals Tabs tablet Take 1 tablet by  mouth daily.   naloxone 4 MG/0.1ML Liqd nasal spray kit Commonly known as: NARCAN Place 1 spray into the nose as needed for opioid reversal.   OneTouch Delica Plus [id number] Misc 1 each by Other route 3 (three) times daily. Use to check blood sugar 3 times a day   OneTouch Verio test strip Generic drug: glucose blood TEST BLOOD SUGAR THREE TIMES DAILY   oxyCODONE 15 MG immediate release tablet Commonly known as: ROXICODONE Take 1 tablet (15 mg total) by mouth every 6 (six) hours as needed for pain.   potassium chloride SA 20 MEQ tablet Commonly known as: KLOR-CON Take 20 mEq by mouth daily.   pregabalin 300 MG capsule Commonly known as: LYRICA Take 300 mg by mouth 2 (two) times daily.   rosuvastatin 20 MG tablet Commonly known as: CRESTOR Take 20 mg by mouth daily.    sucralfate 1 g tablet Commonly known as: CARAFATE Take 1 g by mouth 4 (four) times daily as needed.   Sure Comfort Pen Needles 32G X 6 MM Misc Generic drug: Insulin Pen Needle test FOUR TIMES DAILY   tolterodine 2 MG 24 hr capsule Commonly known as: DETROL LA Take 2 mg by mouth daily.   Trulicity 4.5 OZ/3.6UY Sopn Generic drug: Dulaglutide Inject 4.5 mg into the skin once a week.   valACYclovir 1000 MG tablet Commonly known as: VALTREX Take 1,000 mg by mouth daily.   venlafaxine XR 150 MG 24 hr capsule Commonly known as: EFFEXOR-XR Take 150 mg by mouth 2 (two) times daily.   VITAMIN B-12 PO Take 1 tablet by mouth daily.   Vitamin D (Ergocalciferol) 1.25 MG (50000 UNIT) Caps capsule Commonly known as: DRISDOL Take 50,000 Units by mouth once a week.   Vitamin D-3 25 MCG (1000 UT) Caps Take 1,000 Units by mouth daily.   Xtampza ER 27 MG C12a Generic drug: oxyCODONE ER Take 27 mg by mouth at bedtime.   ziprasidone 60 MG capsule Commonly known as: GEODON Take 60 mg by mouth daily.        Follow-up Information     Fredrich Romans, [place] Follow up in 1 week(s).   Specialty: Physician Assistant Contact information: [address] [place] [address] [phone]                Allergies  Allergen Reactions   Bee Venom Anaphylaxis   Invokana [Canagliflozin] Anaphylaxis    Dyspnea and urinary retention   Shellfish Allergy Anaphylaxis   Buprenorphine Hcl Other (See Comments)    Overly sedated with morphine drip.   Other Other (See Comments)    Cats - bad asthma, stopped up    Consultations: None   Procedures/Studies: CT HEAD WO CONTRAST (5MM)  Result Date: 09/29/2020 CLINICAL DATA:  Delirium.  Found unresponsive by home health aide. EXAM: CT HEAD WITHOUT CONTRAST TECHNIQUE: Contiguous axial images were obtained from the base of the skull through the vertex without intravenous contrast. COMPARISON:  Head CT 04/23/2016 FINDINGS: Brain: Mild  motion artifact limitations. Allowing for this, no intracranial hemorrhage. No mass effect or midline shift. No hydrocephalus. The basilar cisterns are patent. No evidence of territorial infarct or acute ischemia. Partially empty sella, chronic and unchanged from prior. No extra-axial or intracranial fluid collection. Vascular: No hyperdense vessel. Skull: No fracture or focal lesion. Sinuses/Orbits: Mucosal thickening throughout ethmoid air cells. No sinus fluid levels. Chronic bilateral proptosis. No acute orbital findings. No mastoid effusion. Other: 11 mm soft tissue nodularity of the left occipital scalp  appears similar to prior imaging. IMPRESSION: No acute intracranial abnormality, allowing for mild motion artifact limitations. Electronically Signed   By: Keith Rake M.D.   On: 09/29/2020 17:06   CT Chest Wo Contrast  Result Date: 09/29/2020 CLINICAL DATA:  Persistent cough and recently unresponsive, possible oxycodone overdose EXAM: CT CHEST WITHOUT CONTRAST TECHNIQUE: Multidetector CT imaging of the chest was performed following the standard protocol without IV contrast. COMPARISON:  Chest x-ray from earlier in the same day, CT from 02/14/2016. FINDINGS: Cardiovascular: Atherosclerotic calcifications of the thoracic aorta are noted. Coronary calcifications are seen. No aneurysmal dilatation is seen. No cardiac enlargement is noted. Pulmonary artery is not significantly enlarged. Mediastinum/Nodes: Postsurgical changes are noted in the thoracic inlet consistent with prior thyroidectomy. Tiny mediastinal lymph nodes are noted. No sizable hilar or mediastinal adenopathy is noted. Small sliding-type hiatal hernia is noted. The esophagus is otherwise within normal limits. Lungs/Pleura: Lungs are well aerated bilaterally with the exception of mild bibasilar atelectasis. No focal confluent infiltrate is seen. No sizable parenchymal nodule is noted. Changes of mild pulmonary edema are noted. Upper Abdomen:  Visualized upper abdomen appears within normal limits. Musculoskeletal: Mild degenerative changes of the thoracic spine are seen. No acute bony abnormality is noted. IMPRESSION: Changes of mild pulmonary edema. Small sliding-type hiatal hernia. No other significant abnormality is seen. Aortic Atherosclerosis (ICD10-I70.0). Electronically Signed   By: Inez Catalina M.D.   On: 09/29/2020 17:04   DG Chest Port 1 View  Result Date: 09/29/2020 CLINICAL DATA:  Central line placement EXAM: PORTABLE CHEST 1 VIEW COMPARISON:  September 29, 2020 FINDINGS: Stable cardiomegaly. The hila and mediastinum are unremarkable. A new left central line terminates in the central SVC. No pneumothorax. Suggested mild pulmonary edema, unchanged. Patchy bibasilar opacities may represent atelectasis or infiltrates. IMPRESSION: Cardiomegaly and probable mild edema. Patchy opacity in the left greater than right bases could represent atelectasis or infiltrate. Recommend attention on follow-up. The new left central line is in good position terminating in the SVC with no pneumothorax. Electronically Signed   By: Dorise Bullion III M.D.   On: 09/29/2020 20:24   DG Chest Portable 1 View  Result Date: 09/29/2020 CLINICAL DATA:  Altered mental status EXAM: PORTABLE CHEST 1 VIEW COMPARISON:  04/20/2019 FINDINGS: Cardiomegaly. Low lung volumes. Patchy bibasilar opacities. Diffuse interstitial prominence with probable small bilateral pleural effusions. No pneumothorax. IMPRESSION: Cardiomegaly with suggestion of mild pulmonary edema. Patchy bibasilar opacities may represent atelectasis, edema, or pneumonia. Electronically Signed   By: Davina Poke D.O.   On: 09/29/2020 15:26   DG Shoulder Left  Result Date: 10/02/2020 CLINICAL DATA:  Left shoulder pain. EXAM: LEFT SHOULDER - 2+ VIEW COMPARISON:  None. FINDINGS: There is no evidence of an acute fracture or dislocation. Chronic changes are seen involving the left acromion. Mild degenerative  changes are seen involving the left acromioclavicular joint with mild to moderate severity degenerative changes along the left glenohumeral articulation. A left-sided venous catheter is in place. Radiopaque surgical clips are seen overlying the superior mediastinum. Soft tissues are otherwise unremarkable. IMPRESSION: Chronic and degenerative changes without an acute osseous abnormality. Electronically Signed   By: Virgina Norfolk M.D.   On: 10/02/2020 19:24      Subjective: Patient was seen and examined at bedside.  Overnight events noted.  Patient is alert and oriented x3 . She is back to her baseline mental status.  Patient feels better and wants to be discharged.  Patient is being discharged to [facility] for rehab.  Discharge Exam: Vitals:   10/06/20 0523 10/06/20 0926  BP: (!) 172/93 (!) 171/90  Pulse: 71 68  Resp: 19 19  Temp: 98.1 F (36.7 C) 98.2 F (36.8 C)  SpO2:  99%   Vitals:   10/05/20 2019 10/06/20 0043 10/06/20 0523 10/06/20 0926  BP: (!) 171/97 (!) 173/92 (!) 172/93 (!) 171/90  Pulse: 73 73 71 68  Resp: _0 Temp: 98.3 F (36.8 C) 98.6 F (37 C) 98.1 F (36.7 C) 98.2 F (36.8 C)  TempSrc: Oral Oral Oral Oral  SpO2: 97% 98%  99%  Weight:      Height:        General: Pt is alert, awake, not in acute distress Cardiovascular: RRR, S1/S2 +, no rubs, no gallops Respiratory: CTA bilaterally, no wheezing, no rhonchi Abdominal: Soft, NT, ND, bowel sounds + Extremities: no edema, no cyanosis    The results of significant diagnostics from this hospitalization (including imaging, microbiology, ancillary and laboratory) are listed below for reference.     Microbiology: Recent Results (from the past 240 hour(s))  MRSA Next Gen by PCR, Nasal     Status: Abnormal   Collection Time: 09/29/20  4:10 PM   Specimen: Nasal Mucosa; Nasal Swab  Result Value Ref Range Status   MRSA by PCR Next Gen DETECTED (A) NOT DETECTED Final    Comment: RESULT CALLED TO,  READ BACK BY AND VERIFIED WITH: RN Windle Guard [phone] MLM (NOTE) The GeneXpert MRSA Assay (FDA approved for NASAL specimens only), is one component of a comprehensive MRSA colonization surveillance program. It is not intended to diagnose MRSA infection nor to guide or monitor treatment for MRSA infections. Test performance is not FDA approved in patients less than 48 years old. Performed at [facility], [place] [address] [place], [place] [address]   Culture, blood (routine x 2)     Status: None   Collection Time: 09/29/20  7:32 PM   Specimen: BLOOD  Result Value Ref Range Status   Specimen Description BLOOD RIGHT ARM  Final   Special Requests   Final    BOTTLES DRAWN AEROBIC ONLY Blood Culture results may not be optimal due to an inadequate volume of blood received in culture bottles   Culture   Final    NO GROWTH 5 DAYS Performed at [facility], [place] [address], [place], [place] [address]    Report Status 10/04/2020 FINAL  Final  Culture, blood (routine x 2)     Status: None   Collection Time: 09/29/20  7:35 PM   Specimen: BLOOD  Result Value Ref Range Status   Specimen Description BLOOD LEFT HAND  Final   Special Requests   Final    BOTTLES DRAWN AEROBIC ONLY Blood Culture results may not be optimal due to an inadequate volume of blood received in culture bottles   Culture   Final    NO GROWTH 5 DAYS Performed at [facility], [place] [address] [place], [place] [address]    Report Status 10/04/2020 FINAL  Final  Resp Panel by RT-PCR (Flu A&B, Covid)     Status: None   Collection Time: 09/30/20  4:37 AM  Result Value Ref Range Status   SARS Coronavirus 2 by RT PCR NEGATIVE NEGATIVE Final    Comment: (NOTE) SARS-CoV-2 target nucleic acids are NOT DETECTED.  The SARS-CoV-2 RNA is generally detectable in upper respiratory specimens during the acute phase of infection. The lowest  concentration of SARS-CoV-2 viral copies this assay can detect is 138 copies/mL.  A negative result does not preclude SARS-Cov-2 infection and should not be used as the sole basis for treatment or other patient management decisions. A negative result may occur with  improper specimen collection/handling, submission of specimen other than nasopharyngeal swab, presence of viral mutation(s) within the areas targeted by this assay, and inadequate number of viral copies(<138 copies/mL). A negative result must be combined with clinical observations, patient history, and epidemiological information. The expected result is Negative.  Fact Sheet for Patients:  EntrepreneurPulse.com.au  Fact Sheet for Healthcare Providers:  IncredibleEmployment.be  This test is no t yet approved or cleared by the [place] FDA and  has been authorized for detection and/or diagnosis of SARS-CoV-2 by FDA under an Emergency Use Authorization (EUA). This EUA will remain  in effect (meaning this test can be used) for the duration of the COVID-19 declaration under Section 564(b)(1) of the Act, 21 U.S.C.section 360bbb-3(b)(1), unless the authorization is terminated  or revoked sooner.       Influenza A by PCR NEGATIVE NEGATIVE Final   Influenza B by PCR NEGATIVE NEGATIVE Final    Comment: (NOTE) The Xpert Xpress SARS-CoV-2/FLU/RSV plus assay is intended as an aid in the diagnosis of influenza from Nasopharyngeal swab specimens and should not be used as a sole basis for treatment. Nasal washings and aspirates are unacceptable for Xpert Xpress SARS-CoV-2/FLU/RSV testing.  Fact Sheet for Patients: EntrepreneurPulse.com.au  Fact Sheet for Healthcare Providers: IncredibleEmployment.be  This test is not yet approved or cleared by the [place] FDA and has been authorized for detection and/or diagnosis of SARS-CoV-2 by FDA under an Emergency Use Authorization (EUA). This EUA will remain in effect (meaning this test  can be used) for the duration of the COVID-19 declaration under Section 564(b)(1) of the Act, 21 U.S.C. section 360bbb-3(b)(1), unless the authorization is terminated or revoked.  Performed at [facility], [place] [address], [place], [place] [address]   Resp Panel by RT-PCR (Flu A&B, Covid) Nasopharyngeal Swab     Status: None   Collection Time: 10/05/20  4:11 PM   Specimen: Nasopharyngeal Swab; Nasopharyngeal(NP) swabs in vial transport medium  Result Value Ref Range Status   SARS Coronavirus 2 by RT PCR NEGATIVE NEGATIVE Final    Comment: (NOTE) SARS-CoV-2 target nucleic acids are NOT DETECTED.  The SARS-CoV-2 RNA is generally detectable in upper respiratory specimens during the acute phase of infection. The lowest concentration of SARS-CoV-2 viral copies this assay can detect is 138 copies/mL. A negative result does not preclude SARS-Cov-2 infection and should not be used as the sole basis for treatment or other patient management decisions. A negative result may occur with  improper specimen collection/handling, submission of specimen other than nasopharyngeal swab, presence of viral mutation(s) within the areas targeted by this assay, and inadequate number of viral copies(<138 copies/mL). A negative result must be combined with clinical observations, patient history, and epidemiological information. The expected result is Negative.  Fact Sheet for Patients:  EntrepreneurPulse.com.au  Fact Sheet for Healthcare Providers:  IncredibleEmployment.be  This test is no t yet approved or cleared by the [place] FDA and  has been authorized for detection and/or diagnosis of SARS-CoV-2 by FDA under an Emergency Use Authorization (EUA). This EUA will remain  in effect (meaning this test can be used) for the duration of the COVID-19 declaration under Section 564(b)(1) of the Act, 21 U.S.C.section 360bbb-3(b)(1), unless the authorization is  terminated  or revoked sooner.       Influenza A by PCR NEGATIVE NEGATIVE Final   Influenza B by PCR NEGATIVE NEGATIVE Final    Comment: (NOTE) The Xpert Xpress SARS-CoV-2/FLU/RSV plus assay is intended as an aid in the diagnosis of influenza from Nasopharyngeal swab specimens and should not be used as a sole basis for treatment. Nasal washings and aspirates are unacceptable for Xpert Xpress SARS-CoV-2/FLU/RSV testing.  Fact Sheet for Patients: EntrepreneurPulse.com.au  Fact Sheet for Healthcare Providers: IncredibleEmployment.be  This test is not yet approved or cleared by the [place] FDA and has been authorized for detection and/or diagnosis of SARS-CoV-2 by FDA under an Emergency Use Authorization (EUA). This EUA will remain in effect (meaning this test can be used) for the duration of the COVID-19 declaration under Section 564(b)(1) of the Act, 21 U.S.C. section 360bbb-3(b)(1), unless the authorization is terminated or revoked.  Performed at [facility], [place] [address] [place], [place] [address]      Labs: BNP (last 3 results) Recent Labs    09/30/20 0437  BNP 00.1   Basic Metabolic Panel: Recent Labs  Lab 09/29/20 1441 09/29/20 1452 09/30/20 0437 09/30/20 0447 09/30/20 1215 10/01/20 0456 10/02/20 0421 10/03/20 0404 10/05/20 0407  NA 129*   < > 134*   < > 136 135 139 140 135  K 2.8*   < > 3.0*   < > 3.5 3.5 3.6 3.7 3.6  CL 91*   < > 93*  --  95* 97* 101 102 100  CO2 24  --  29  --  _0 GLUCOSE 229*   < > 221*  --  220* 188* 172* 171* 138*  BUN 52*   < > 41*  --  34* 29* 24* 23 18  CREATININE 2.01*   < > 1.53*  --  1.43* 1.43* 1.24* 1.22* 1.13*  CALCIUM 9.6  --  9.4  --  9.6 9.3 9.4 9.2 8.8*  MG 2.2  --  2.1  --   --   --   --  2.3 2.2  PHOS  --   --  3.0  --   --   --   --  3.7 3.7   < > = values in this interval not displayed.   Liver Function Tests: Recent Labs  Lab 09/29/20 1441  09/30/20 0437 10/03/20 0404  AST 24 20 76*  ALT 20 20 90*  ALKPHOS 77 76 77  BILITOT 0.5 0.6 0.6  PROT 6.8 6.3* 6.3*  ALBUMIN 4.0 3.5 3.5   Recent Labs  Lab 09/30/20 0437  LIPASE 35  AMYLASE 49   No results for input(s): AMMONIA in the last 168 hours. CBC: Recent Labs  Lab 09/29/20 1441 09/29/20 1452 09/30/20 0437 09/30/20 0447 10/01/20 0456 10/02/20 1231 10/03/20 0404 10/05/20 0407  WBC 10.7*  --  8.8  --  7.8 9.2 9.0 9.5  NEUTROABS 8.2*  --   --   --   --  6.8 6.3  --   HGB 11.2*   < > 10.7* 10.9* 10.2* 10.6* 10.5* 10.1*  HCT 34.7*   < > 32.4* 32.0* 31.2* 32.7* 32.1* 30.3*  MCV 91.8  --  89.3  --  89.1 91.9 91.2 89.1  PLT 160  --  194  --  199 192 195 173   < > = values in this interval not displayed.   Cardiac Enzymes: No results for input(s): CKTOTAL,  CKMB, CKMBINDEX, TROPONINI in the last 168 hours. BNP: Invalid input(s): POCBNP CBG: Recent Labs  Lab 10/05/20 2010 10/05/20 2214 10/06/20 0048 10/06/20 0429 10/06/20 0740  GLUCAP 146* 144* 128* 133* 129*   D-Dimer No results for input(s): DDIMER in the last 72 hours. Hgb A1c No results for input(s): HGBA1C in the last 72 hours. Lipid Profile No results for input(s): CHOL, HDL, LDLCALC, TRIG, CHOLHDL, LDLDIRECT in the last 72 hours. Thyroid function studies No results for input(s): TSH, T4TOTAL, T3FREE, THYROIDAB in the last 72 hours.  Invalid input(s): FREET3 Anemia work up No results for input(s): VITAMINB12, FOLATE, FERRITIN, TIBC, IRON, RETICCTPCT in the last 72 hours. Urinalysis    Component Value Date/Time   COLORURINE YELLOW 04/20/2019 1555   APPEARANCEUR CLEAR 04/20/2019 1555   APPEARANCEUR Clear 11/13/2013 1310   LABSPEC 1.012 04/20/2019 1555   LABSPEC 1.017 11/13/2013 1310   PHURINE 8.0 04/20/2019 1555   GLUCOSEU 150 (A) 04/20/2019 1555   GLUCOSEU Negative 11/13/2013 1310   GLUCOSEU NEG mg/dL 03/02/2006 2050   HGBUR NEGATIVE 04/20/2019 1555   BILIRUBINUR NEGATIVE 04/20/2019 1555    BILIRUBINUR Negative 11/13/2013 1310   KETONESUR NEGATIVE 04/20/2019 1555   PROTEINUR NEGATIVE 04/20/2019 1555   UROBILINOGEN 0.2 11/12/2014 2057   NITRITE NEGATIVE 04/20/2019 1555   LEUKOCYTESUR NEGATIVE 04/20/2019 1555   LEUKOCYTESUR Trace 11/13/2013 1310   Sepsis Labs Invalid input(s): PROCALCITONIN,  WBC,  LACTICIDVEN Microbiology Recent Results (from the past 240 hour(s))  MRSA Next Gen by PCR, Nasal     Status: Abnormal   Collection Time: 09/29/20  4:10 PM   Specimen: Nasal Mucosa; Nasal Swab  Result Value Ref Range Status   MRSA by PCR Next Gen DETECTED (A) NOT DETECTED Final    Comment: RESULT CALLED TO, READ BACK BY AND VERIFIED WITH: RN Windle Guard [phone] [id number] [id number] (NOTE) The GeneXpert MRSA Assay (FDA approved for NASAL specimens only), is one component of a comprehensive MRSA colonization surveillance program. It is not intended to diagnose MRSA infection nor to guide or monitor treatment for MRSA infections. Test performance is not FDA approved in patients less than 60 years old. Performed at [facility], [place] [address], [place], [place] [address]   Culture, blood (routine x 2)     Status: None   Collection Time: 09/29/20  7:32 PM   Specimen: BLOOD  Result Value Ref Range Status   Specimen Description BLOOD RIGHT ARM  Final   Special Requests   Final    BOTTLES DRAWN AEROBIC ONLY Blood Culture results may not be optimal due to an inadequate volume of blood received in culture bottles   Culture   Final    NO GROWTH 5 DAYS Performed at [facility], [place] [address], [place], [place] [address]    Report Status 10/04/2020 FINAL  Final  Culture, blood (routine x 2)     Status: None   Collection Time: 09/29/20  7:35 PM   Specimen: BLOOD  Result Value Ref Range Status   Specimen Description BLOOD LEFT HAND  Final   Special Requests   Final    BOTTLES DRAWN AEROBIC ONLY Blood Culture results may not be optimal due to an inadequate volume of blood received in  culture bottles   Culture   Final    NO GROWTH 5 DAYS Performed at [facility], [place] [address] [place], [place] [address]    Report Status 10/04/2020 FINAL  Final  Resp Panel by RT-PCR (Flu  A&B, Covid)     Status: None   Collection Time: 09/30/20  4:37 AM  Result Value Ref Range Status   SARS Coronavirus 2 by RT PCR NEGATIVE NEGATIVE Final    Comment: (NOTE) SARS-CoV-2 target nucleic acids are NOT DETECTED.  The SARS-CoV-2 RNA is generally detectable in upper respiratory specimens during the acute phase of infection. The lowest concentration of SARS-CoV-2 viral copies this assay can detect is 138 copies/mL. A negative result does not preclude SARS-Cov-2 infection and should not be used as the sole basis for treatment or other patient management decisions. A negative result may occur with  improper specimen collection/handling, submission of specimen other than nasopharyngeal swab, presence of viral mutation(s) within the areas targeted by this assay, and inadequate number of viral copies(<138 copies/mL). A negative result must be combined with clinical observations, patient history, and epidemiological information. The expected result is Negative.  Fact Sheet for Patients:  EntrepreneurPulse.com.au  Fact Sheet for Healthcare Providers:  IncredibleEmployment.be  This test is no t yet approved or cleared by the [place] FDA and  has been authorized for detection and/or diagnosis of SARS-CoV-2 by FDA under an Emergency Use Authorization (EUA). This EUA will remain  in effect (meaning this test can be used) for the duration of the COVID-19 declaration under Section 564(b)(1) of the Act, 21 U.S.C.section 360bbb-3(b)(1), unless the authorization is terminated  or revoked sooner.       Influenza A by PCR NEGATIVE NEGATIVE Final   Influenza B by PCR NEGATIVE NEGATIVE Final    Comment: (NOTE) The Xpert Xpress SARS-CoV-2/FLU/RSV plus  assay is intended as an aid in the diagnosis of influenza from Nasopharyngeal swab specimens and should not be used as a sole basis for treatment. Nasal washings and aspirates are unacceptable for Xpert Xpress SARS-CoV-2/FLU/RSV testing.  Fact Sheet for Patients: EntrepreneurPulse.com.au  Fact Sheet for Healthcare Providers: IncredibleEmployment.be  This test is not yet approved or cleared by the [place] FDA and has been authorized for detection and/or diagnosis of SARS-CoV-2 by FDA under an Emergency Use Authorization (EUA). This EUA will remain in effect (meaning this test can be used) for the duration of the COVID-19 declaration under Section 564(b)(1) of the Act, 21 U.S.C. section 360bbb-3(b)(1), unless the authorization is terminated or revoked.  Performed at [facility], [place] [address] [place], [place] [address]   Resp Panel by RT-PCR (Flu A&B, Covid) Nasopharyngeal Swab     Status: None   Collection Time: 10/05/20  4:11 PM   Specimen: Nasopharyngeal Swab; Nasopharyngeal(NP) swabs in vial transport medium  Result Value Ref Range Status   SARS Coronavirus 2 by RT PCR NEGATIVE NEGATIVE Final    Comment: (NOTE) SARS-CoV-2 target nucleic acids are NOT DETECTED.  The SARS-CoV-2 RNA is generally detectable in upper respiratory specimens during the acute phase of infection. The lowest concentration of SARS-CoV-2 viral copies this assay can detect is 138 copies/mL. A negative result does not preclude SARS-Cov-2 infection and should not be used as the sole basis for treatment or other patient management decisions. A negative result may occur with  improper specimen collection/handling, submission of specimen other than nasopharyngeal swab, presence of viral mutation(s) within the areas targeted by this assay, and inadequate number of viral copies(<138 copies/mL). A negative result must be combined with clinical observations,  patient history, and epidemiological information. The expected result is Negative.  Fact Sheet for Patients:  EntrepreneurPulse.com.au  Fact Sheet for Healthcare Providers:  IncredibleEmployment.be  This test is  no t yet approved or cleared by the [place] and  has been authorized for detection and/or diagnosis of SARS-CoV-2 by FDA under an Emergency Use Authorization (EUA). This EUA will remain  in effect (meaning this test can be used) for the duration of the COVID-19 declaration under Section 564(b)(1) of the Act, 21 U.S.C.section 360bbb-3(b)(1), unless the authorization is terminated  or revoked sooner.       Influenza A by PCR NEGATIVE NEGATIVE Final   Influenza B by PCR NEGATIVE NEGATIVE Final    Comment: (NOTE) The Xpert Xpress SARS-CoV-2/FLU/RSV plus assay is intended as an aid in the diagnosis of influenza from Nasopharyngeal swab specimens and should not be used as a sole basis for treatment. Nasal washings and aspirates are unacceptable for Xpert Xpress SARS-CoV-2/FLU/RSV testing.  Fact Sheet for Patients: EntrepreneurPulse.com.au  Fact Sheet for Healthcare Providers: IncredibleEmployment.be  This test is not yet approved or cleared by the [place] FDA and has been authorized for detection and/or diagnosis of SARS-CoV-2 by FDA under an Emergency Use Authorization (EUA). This EUA will remain in effect (meaning this test can be used) for the duration of the COVID-19 declaration under Section 564(b)(1) of the Act, 21 U.S.C. section 360bbb-3(b)(1), unless the authorization is terminated or revoked.  Performed at [facility], [place] [address] [place], [place] [address]      Time coordinating discharge: Over 30 minutes  SIGNED:   Shawna Clamp, MD  [facility] 10/06/2020, 11:41 AM Pager   If 7PM-7AM, please contact night-coverage

## 2020-10-09 ENCOUNTER — Ambulatory Visit: Payer: Medicare Other | Admitting: Endocrinology

## 2020-10-15 IMAGING — DX DG CHEST 1V PORT
1 series · 1 of 1 positions shown · non-contrast
Comparison: October 28, 2016

CLINICAL DATA: Fever and dyspnea the past day.

EXAM:
PORTABLE CHEST 1 VIEW

[chest ap]
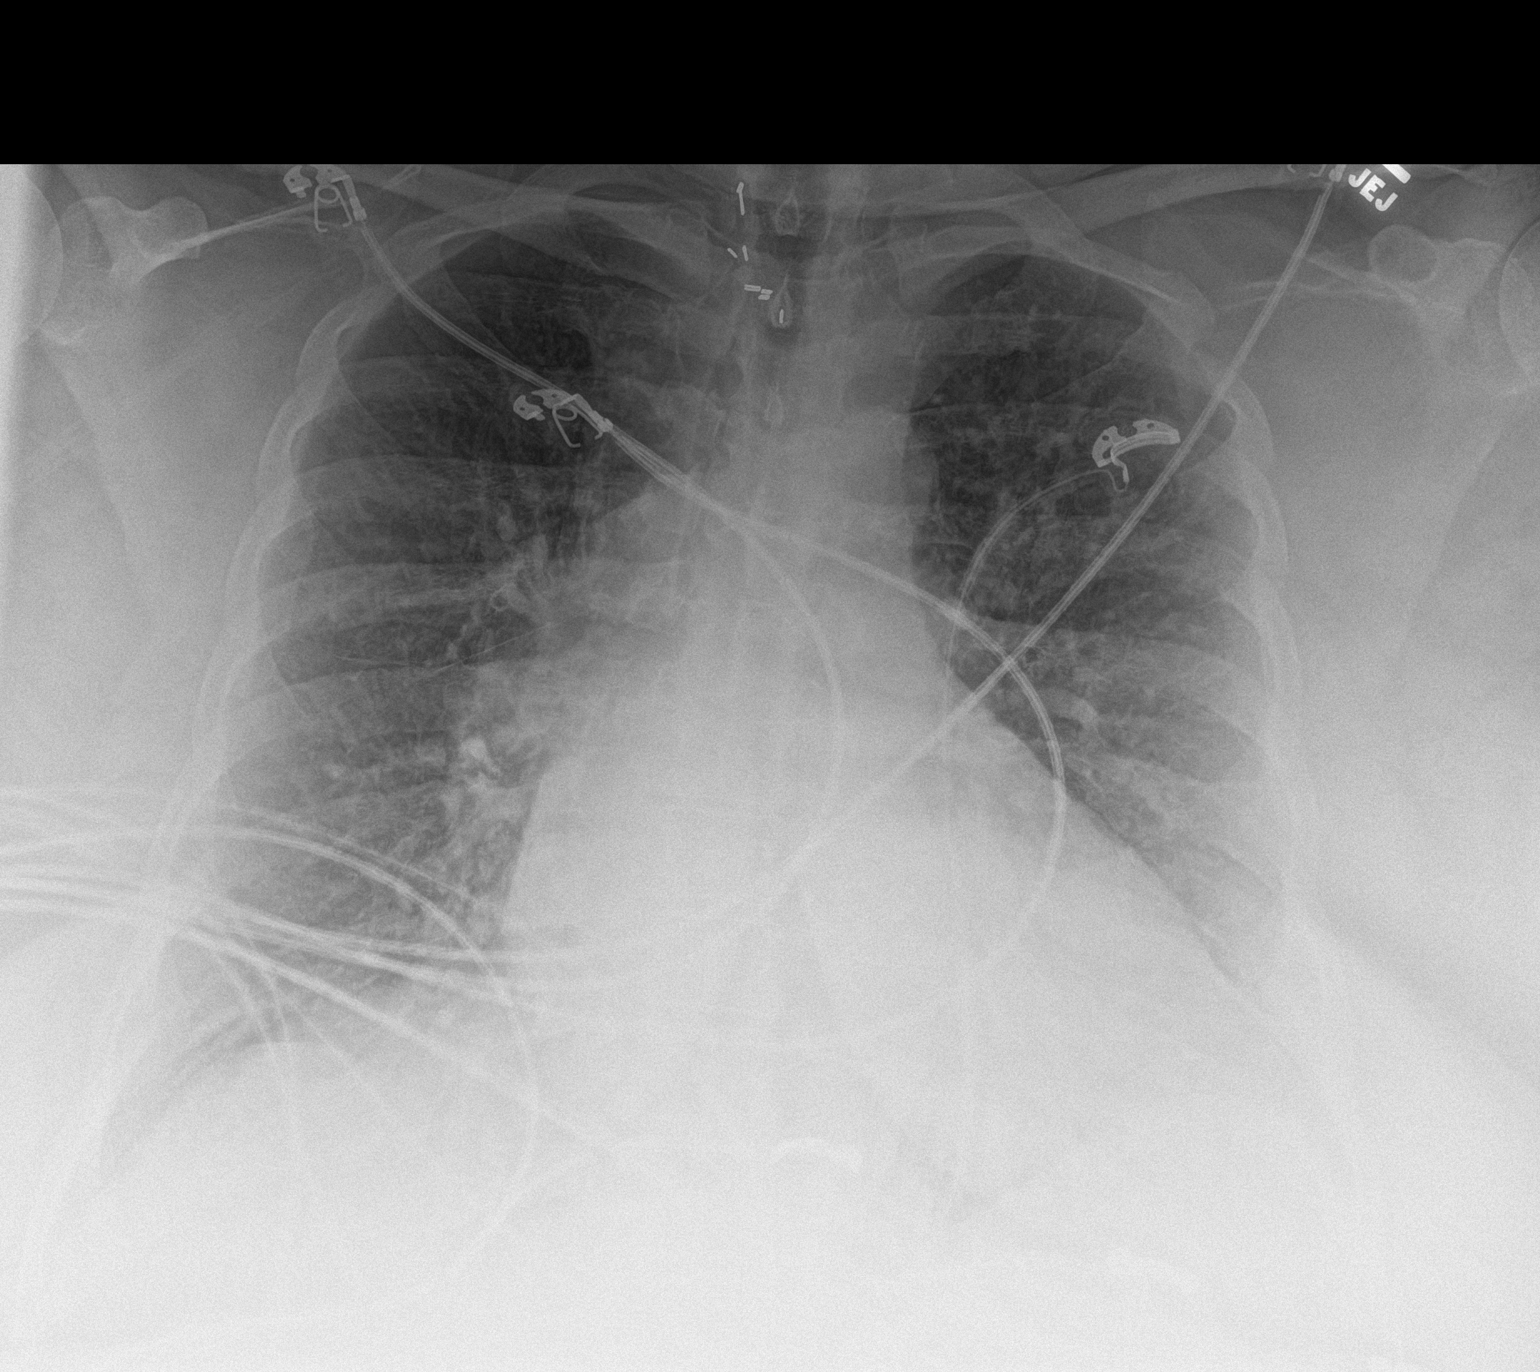

[1 of 1 positions shown; findings below may reference images not displayed]

FINDINGS: Stable mild cardiomegaly. Pulmonary edema with shallow inspiration
and medial bibasilar consolidation, probably atelectatic. Probable
trace left pleural effusion. Remote right thoracic inlet surgical
clips. Mild skeletal degenerative change. Telemetry leads.
IMPRESSION: Stable mild cardiomegaly with pulmonary edema and medial bibasilar
consolidation, probably atelectatic. Probable trace left pleural
effusion.

## 2020-10-19 ENCOUNTER — Other Ambulatory Visit: Payer: Self-pay | Admitting: Endocrinology

## 2020-10-19 DIAGNOSIS — N2581 Secondary hyperparathyroidism of renal origin: Secondary | ICD-10-CM

## 2020-10-29 ENCOUNTER — Other Ambulatory Visit: Payer: Self-pay | Admitting: Family Medicine

## 2020-11-01 ENCOUNTER — Other Ambulatory Visit: Payer: Medicare Other

## 2020-11-07 ENCOUNTER — Other Ambulatory Visit: Payer: Self-pay | Admitting: Endocrinology

## 2020-11-07 DIAGNOSIS — E118 Type 2 diabetes mellitus with unspecified complications: Secondary | ICD-10-CM

## 2020-11-10 ENCOUNTER — Emergency Department (HOSPITAL_COMMUNITY)
Admission: EM | Admit: 2020-11-10 | Discharge: 2020-11-10 | Disposition: A | Discharge status: Home / Self Care | Payer: Medicare Other | Attending: Emergency Medicine | Admitting: Emergency Medicine

## 2020-11-10 ENCOUNTER — Other Ambulatory Visit: Payer: Self-pay

## 2020-11-10 ENCOUNTER — Encounter (HOSPITAL_COMMUNITY): Payer: Self-pay

## 2020-11-10 ENCOUNTER — Emergency Department (HOSPITAL_COMMUNITY): Payer: Medicare Other

## 2020-11-10 DIAGNOSIS — Z853 Personal history of malignant neoplasm of breast: Secondary | ICD-10-CM | POA: Insufficient documentation

## 2020-11-10 DIAGNOSIS — N183 Chronic kidney disease, stage 3 unspecified: Secondary | ICD-10-CM | POA: Diagnosis not present

## 2020-11-10 DIAGNOSIS — Z87891 Personal history of nicotine dependence: Secondary | ICD-10-CM | POA: Diagnosis not present

## 2020-11-10 DIAGNOSIS — I129 Hypertensive chronic kidney disease with stage 1 through stage 4 chronic kidney disease, or unspecified chronic kidney disease: Secondary | ICD-10-CM | POA: Insufficient documentation

## 2020-11-10 DIAGNOSIS — E1122 Type 2 diabetes mellitus with diabetic chronic kidney disease: Secondary | ICD-10-CM | POA: Insufficient documentation

## 2020-11-10 DIAGNOSIS — Z96653 Presence of artificial knee joint, bilateral: Secondary | ICD-10-CM | POA: Diagnosis not present

## 2020-11-10 DIAGNOSIS — R339 Retention of urine, unspecified: Secondary | ICD-10-CM | POA: Insufficient documentation

## 2020-11-10 DIAGNOSIS — Z794 Long term (current) use of insulin: Secondary | ICD-10-CM | POA: Insufficient documentation

## 2020-11-10 DIAGNOSIS — Z7982 Long term (current) use of aspirin: Secondary | ICD-10-CM | POA: Insufficient documentation

## 2020-11-10 DIAGNOSIS — J4530 Mild persistent asthma, uncomplicated: Secondary | ICD-10-CM | POA: Insufficient documentation

## 2020-11-10 DIAGNOSIS — E039 Hypothyroidism, unspecified: Secondary | ICD-10-CM | POA: Diagnosis not present

## 2020-11-10 DIAGNOSIS — R109 Unspecified abdominal pain: Secondary | ICD-10-CM | POA: Diagnosis present

## 2020-11-10 DIAGNOSIS — G20 Parkinson's disease: Secondary | ICD-10-CM | POA: Insufficient documentation

## 2020-11-10 LAB — CBC WITH DIFFERENTIAL/PLATELET
Abs Immature Granulocytes: 0.06 10*3/uL (ref 0.00–0.07)
Basophils Absolute: 0.1 10*3/uL (ref 0.0–0.1)
Basophils Relative: 1 %
Eosinophils Absolute: 0.1 10*3/uL (ref 0.0–0.5)
Eosinophils Relative: 1 %
HCT: 35.8 % — ABNORMAL LOW (ref 36.0–46.0)
Hemoglobin: 12 g/dL (ref 12.0–15.0)
Immature Granulocytes: 1 %
Lymphocytes Relative: 16 %
Lymphs Abs: 1.6 10*3/uL (ref 0.7–4.0)
MCH: 29 pg (ref 26.0–34.0)
MCHC: 33.5 g/dL (ref 30.0–36.0)
MCV: 86.5 fL (ref 80.0–100.0)
Monocytes Absolute: 0.5 10*3/uL (ref 0.1–1.0)
Monocytes Relative: 5 %
Neutro Abs: 7.2 10*3/uL (ref 1.7–7.7)
Neutrophils Relative %: 76 %
Platelets: 296 10*3/uL (ref 150–400)
RBC: 4.14 MIL/uL (ref 3.87–5.11)
RDW: 15.2 % (ref 11.5–15.5)
WBC: 9.6 10*3/uL (ref 4.0–10.5)
nRBC: 0 % (ref 0.0–0.2)

## 2020-11-10 LAB — COMPREHENSIVE METABOLIC PANEL
ALT: 15 U/L (ref 0–44)
AST: 11 U/L — ABNORMAL LOW (ref 15–41)
Albumin: 4.7 g/dL (ref 3.5–5.0)
Alkaline Phosphatase: 76 U/L (ref 38–126)
Anion gap: 14 (ref 5–15)
BUN: 24 mg/dL — ABNORMAL HIGH (ref 8–23)
CO2: 24 mmol/L (ref 22–32)
Calcium: 9.9 mg/dL (ref 8.9–10.3)
Chloride: 94 mmol/L — ABNORMAL LOW (ref 98–111)
Creatinine, Ser: 1.13 mg/dL — ABNORMAL HIGH (ref 0.44–1.00)
GFR, Estimated: 54 mL/min — ABNORMAL LOW (ref >60)
Glucose, Bld: 139 mg/dL — ABNORMAL HIGH (ref 70–99)
Potassium: 3.6 mmol/L (ref 3.5–5.1)
Sodium: 132 mmol/L — ABNORMAL LOW (ref 135–145)
Total Bilirubin: 0.6 mg/dL (ref 0.3–1.2)
Total Protein: 7.9 g/dL (ref 6.5–8.1)

## 2020-11-10 LAB — URINALYSIS, ROUTINE W REFLEX MICROSCOPIC
Bacteria, UA: NONE SEEN
Bilirubin Urine: NEGATIVE
Glucose, UA: NEGATIVE mg/dL
Hgb urine dipstick: NEGATIVE
Ketones, ur: NEGATIVE mg/dL
Leukocytes,Ua: NEGATIVE
Nitrite: NEGATIVE
Protein, ur: NEGATIVE mg/dL
Specific Gravity, Urine: 1.01 (ref 1.005–1.030)
pH: 7 (ref 5.0–8.0)

## 2020-11-10 MED ORDER — IOHEXOL 350 MG/ML SOLN
100.0000 mL | Freq: Once | INTRAVENOUS | Status: AC | PRN
  Administered 2020-11-10: 100 mL via INTRAVENOUS

## 2020-11-10 MED ORDER — SODIUM CHLORIDE (PF) 0.9 % IJ SOLN
INTRAMUSCULAR | Status: AC
  Filled 2020-11-10: qty 50

## 2020-11-10 NOTE — ED Triage Notes (Signed)
Patient BIBA from home c/o decreased urinary output x2 days and constipation x5 days. Also reports LUQ abdominal pain, denies n/v. Hx CKD and HTN. AOx4  BP 170/82, P 96, CBG 141, SpO2 100%

## 2020-11-10 NOTE — ED Provider Notes (Signed)
[facility] DEPT Provider Note   CSN: [id number] Arrival date & time: 11/10/20  1139     History Chief Complaint  Patient presents with   Abdominal Pain   Constipation    [name] is a 65 y.o. female presenting for evaluation of abdominal pain and urinary retention.  Patient states she has not been able to urinate normally in the past 3 days.  She states she is leaking some minimal urine, but is unable to relieve herself.  This is atypical for her.  She also reports a history of constipation, has not had a bowel movement in 5 days, but this is normal for her.  She denies fevers, chills, cough, chest pain, shortness of breath, nausea, vomiting, upper abdominal pain.  Her abdominal pain is over her bladder, states it feels like it is too distended.  She has never had issues with urinary retention before.  No new medications.  No recent procedures.  Additional history obtained from chart review.  Patient with a history of anemia, anxiety, asthma, breast cancer, chronic pain, CKD, depression, GERD, hypertension, hyperlipidemia, kidney stone, obesity, OSA on CPAP, schizophrenia, parkinsonism, diabetes.  HPI     Past Medical History:  Diagnosis Date   Anemia    Anxiety    Arthritis    "pretty much all my joints"   Asthma    Benign paroxysmal positional vertigo 12/30/2012   Breast cancer ([place]) 02/13/12   ruq  100'clock bx Ductal Carcinoma in Situ,(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   Breast mass in female    L breast 2008, US showed likely fat necrosis   Chronic lower back pain    CKD (chronic kidney disease), stage III ([place])    "lower stage" (01/06/2014)   Daily headache    last time was 09-15-2016 (brother passed away)   Depression    Dyspnea    Gait abnormality 05/29/2016   GERD (gastroesophageal reflux disease)    History of blood transfusion 2011   "after one of my OR's"   History of hiatal hernia    History of stomach ulcers    HSV (herpes simplex  virus) infection    Hx of radiation therapy 05/05/12- 07/15/12   right breast, 45 gray x 25 fx, lumpectomy cavity boosted to 16.2 gray   Hyperlipemia    Hypertension    sees Dr. 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   Tachycardia    with sx in 2008   Thyroid disease    Type II diabetes mellitus ([place])    dx 2014    Patient Active Problem List   Diagnosis Date Noted   AMS (altered mental status) 09/29/2020   Hyperparathyroidism, secondary ([place]) 03/23/2018   Memory difficulty 12/22/2017   Spinal stenosis 06/09/2017   HNP (herniated nucleus pulposus), lumbar 12/05/2016   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   Diabetic neuropathy ([place]) 09/08/2016   Hypocalcemia 08/22/2016   Abnormal barium swallow 07/07/2016   Anxiety 07/07/2016  History of malignant neoplasm of breast 07/07/2016   Gait abnormality 05/29/2016   Encephalopathy acute 04/23/2016   Hyponatremia 04/23/2016   Schizophrenia, paranoid ([place])    Aspiration pneumonia of left lower lobe due to gastric secretions (HCC)    Projectile vomiting with nausea    Altered mental status    Schizophrenia ([place])    Disorganized schizophrenia ([place])    Chronic pain syndrome    Acute renal failure ([place])    Controlled diabetes mellitus type 2 with complications ([place])    Acute encephalopathy    Oropharyngeal dysphagia    Aspiration pneumonia of right lower lobe due to vomit ([place])    Acute renal failure (ARF) ([place]) 03/28/2016   Encephalopathy 03/27/2016   HCAP (healthcare-associated pneumonia) 02/29/2016   Pneumonia  02/14/2016   CAP (community acquired pneumonia) 02/13/2016   Edema 02/13/2016   AKI (acute kidney injury) ([place]) 02/13/2016   Elevated troponin 06/24/2015   Diarrhea 06/23/2015   Sepsis ([place]) 06/23/2015   Obesity, morbid, BMI 50 or higher ([place]) 03/26/2015   Post traumatic stress disorder (PTSD) 11/14/2014   Leukocytosis 11/12/2014   Slurred speech 11/12/2014   CKD (chronic kidney disease) stage 3, GFR 30-59 ml/min (HCC)    Diabetes mellitus, type II, insulin dependent ([place])    TIA (transient ischemic attack)    Hypotension 10/28/2014   Syncope 10/28/2014   Anemia 10/28/2014   Type II diabetes mellitus ([place]) 10/28/2014   OSA on CPAP 10/28/2014   Chronic lower back pain 10/28/2014   Polyneuropathy in diabetes(357.2) 10/28/2014   Diabetic polyneuropathy ([place])    Fall 08/14/2014   Recurrent falls 08/14/2014   Hot flashes 02/24/2014   Arthralgia 02/24/2014   Hair loss 02/24/2014   Chest pain 01/06/2014   Shock ([place]) 08/08/2013   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   Dysarthria 12/30/2012   Benign paroxysmal positional vertigo 12/30/2012   Breast cancer of upper-outer quadrant of right female breast ([place]) 11/18/2012   Hemorrhage of rectum and anus 09/28/2012   Hx of radiation therapy    Syncope and collapse 06/04/2011   HSV 10/05/2008   Mild persistent asthma 06/02/2008   Dyspnea 07/19/2007   HLD (hyperlipidemia) 06/02/2006   Sinus tachycardia 05/25/2006   Hypothyroidism 02/27/2006   Depression 02/27/2006   Essential hypertension 02/27/2006   GERD 02/27/2006   KNEE PAIN 02/27/2006    Past Surgical History:  Procedure Laterality Date   ABDOMINAL HYSTERECTOMY  1979?   partial   benign cyst  Left    removed from left breast   BREAST BIOPSY Right 02/13/2012   BREAST LUMPECTOMY  03/03/2012   Procedure: LUMPECTOMY;  Surgeon: Haywood Lasso, MD;  Location: [facility];  Service: General;  Laterality: Right;   BREAST SURGERY Right 01/2012   "cancer"   CHOLECYSTECTOMY   1980's?   COLONOSCOPY WITH PROPOFOL N/A 09/28/2012   Procedure: COLONOSCOPY WITH PROPOFOL;  Surgeon: Lear Ng, MD;  Location: [facility];  Service: Endoscopy;  Laterality: N/A;   FOOT FRACTURE SURGERY Right 1990's   JOINT REPLACEMENT      x 3   LUMBAR LAMINECTOMY/DECOMPRESSION MICRODISCECTOMY N/A 12/05/2016   Procedure: LEFT L5-S1 MICRODISCECTOMY;  Surgeon: Marybelle Killings, MD;  Location: [facility];  Service: Orthopedics;  Laterality: N/A;   REVISION TOTAL KNEE ARTHROPLASTY  07/2009   SHOULDER OPEN ROTATOR CUFF REPAIR Left 1990's   THYROIDECTOMY  1970's   TOTAL KNEE ARTHROPLASTY Bilateral 2009-06/2009   left; right     OB History   No obstetric history on  file.     Family History  Problem Relation Age of Onset   Heart disease Brother        Multiple MIs, starting in his 35s   Diabetes Brother    Thyroid disease Brother    Hypertension Mother    Diabetes Mother    Breast cancer Mother 54   Bone cancer Mother    Hypertension Sister    Diabetes Sister    Breast cancer Sister 70   Thyroid disease Sister    Breast cancer Maternal Grandmother    Heart disease Maternal Grandmother    Uterine cancer Other 19   Breast cancer Paternal Aunt 47   Breast cancer Paternal Grandmother        dx in her 23s   Prostate cancer Paternal Grandfather    Asthma Son     Social History   Tobacco Use   Smoking status: Former    Packs/day: 1.00    Years: 7.00    Pack years: 7.00    Types: Cigarettes    Quit date: 03/24/1992    Years since quitting: 28.6   Smokeless tobacco: Never  Vaping Use   Vaping Use: Never used  Substance Use Topics   Alcohol use: No    Comment: "stopped drinking in 1996; I was an alcoholic"   Drug use: No    Home Medications Prior to Admission medications   Medication Sig Start Date End Date Taking? Authorizing Provider  albuterol (VENTOLIN HFA) 108 (90 Base) MCG/ACT inhaler Inhale 2 puffs into the lungs every 4 (four) hours as needed for wheezing or  shortness of breath.     [provider]  amLODipine (NORVASC) 10 MG tablet Take 1 tablet (10 mg total) by mouth daily. 10/07/20   Shawna Clamp, MD  aspirin EC 81 MG tablet Take 1 tablet (81 mg total) by mouth daily. 08/12/13   Viyuoh, Alison Stalling, MD  B Complex-C (B-COMPLEX WITH VITAMIN C) tablet Take 1 tablet by mouth daily.    [provider]  calcitRIOL (ROCALTROL) 0.25 MCG capsule Take 1 capsule (0.25 mcg total) by mouth 3 (three) times a week. 10/19/20   Renato Shin, MD  Cholecalciferol (VITAMIN D-3) 1000 units CAPS Take 1,000 Units by mouth daily.    [provider]  Cyanocobalamin (VITAMIN B-12 PO) Take 1 tablet by mouth daily.    [provider]  cyclobenzaprine (FLEXERIL) 10 MG tablet Take 10 mg by mouth at bedtime as needed for muscle spasms. 08/31/20   [provider]  doxepin (SINEQUAN) 50 MG capsule Take 50-100 mg by mouth at bedtime as needed for sleep. 09/24/20   [provider]  esomeprazole (NEXIUM) 40 MG capsule Take 1 capsule (40 mg total) by mouth daily before breakfast. Patient taking differently: Take 40 mg by mouth daily at 12 noon. 10/30/14   Barton Dubois, MD  famotidine (PEPCID) 40 MG tablet Take 80 mg by mouth daily as needed for indigestion. 06/16/20   [provider]  furosemide (LASIX) 20 MG tablet Take 2 tablets (40 mg total) by mouth daily. 09/09/19   Renato Shin, MD  Insulin NPH, Human,, Isophane, (HUMULIN N KWIKPEN) 100 UNIT/ML Kiwkpen Inject 34 Units into the skin every morning. And pen needles 1/day 07/10/20   Renato Shin, MD  Iron-Vitamins (GERITOL PO) Take 1 tablet by mouth daily.    [provider]  Lancets [facility] DELICA PLUS [id number]) [place] 1 each by Other route 3 (three) times daily. Use to check blood sugar  3 times a day 02/16/19   Renato Shin, MD  levothyroxine (SYNTHROID) 112 MCG tablet Take 2 tablets (224 mcg total) by mouth daily at 6 (six) AM. 10/07/20   Shawna Clamp, MD   linaclotide [facility]) 290 MCG CAPS capsule Take 290 mcg by mouth daily before breakfast.    [provider]  LORazepam (ATIVAN) 0.5 MG tablet Take 1 tablet (0.5 mg total) by mouth every 8 (eight) hours as needed for anxiety. 10/06/20   Shawna Clamp, MD  losartan-hydrochlorothiazide (HYZAAR) 100-25 MG tablet Take 1 tablet by mouth daily. 09/17/20   [provider]  lubiprostone (AMITIZA) 24 MCG capsule Take 24 mcg by mouth 2 (two) times daily. 06/22/20   [provider]  methocarbamol (ROBAXIN) 750 MG tablet Take 750 mg by mouth 3 (three) times daily as needed for muscle spasms. 08/31/20   [provider]  Multiple Vitamin (MULTIVITAMIN WITH MINERALS) TABS tablet Take 1 tablet by mouth daily.    [provider]  naloxone [facility]) nasal spray 4 mg/0.1 mL Place 1 spray into the nose as needed for opioid reversal. 08/01/20   [provider]  [facility] VERIO test strip TEST BLOOD SUGAR THREE TIMES DAILY 03/07/20   Renato Shin, MD  oxyCODONE (ROXICODONE) 15 MG immediate release tablet Take 1 tablet (15 mg total) by mouth every 6 (six) hours as needed for pain. 10/06/20   Shawna Clamp, MD  potassium chloride SA (KLOR-CON) 20 MEQ tablet Take 20 mEq by mouth daily. 06/22/20   [provider]  pregabalin (LYRICA) 300 MG capsule Take 300 mg by mouth 2 (two) times daily.    [provider]  rosuvastatin (CRESTOR) 20 MG tablet Take 20 mg by mouth daily.    [provider]  sucralfate (CARAFATE) 1 g tablet Take 1 g by mouth 4 (four) times daily as needed. 08/13/20   [provider]  SURE COMFORT PEN NEEDLES 32G X 6 MM MISC USE TO INJECT INSULIN FOUR TIMES DAILY 11/07/20   Renato Shin, MD  tolterodine (DETROL LA) 2 MG 24 hr capsule Take 2 mg by mouth daily. 09/24/20   [provider]  TRULICITY 4.5 TK/2.4OX SOPN Inject 4.5 mg into the skin once a week. 09/06/20   Renato Shin, MD  valACYclovir (VALTREX) 1000 MG tablet Take  1,000 mg by mouth daily.    [provider]  venlafaxine XR (EFFEXOR-XR) 150 MG 24 hr capsule Take 150 mg by mouth 2 (two) times daily. 05/21/15   [provider]  Vitamin D, Ergocalciferol, (DRISDOL) 1.25 MG (50000 UNIT) CAPS capsule Take 50,000 Units by mouth once a week. 09/17/20   [provider]  XTAMPZA ER 27 MG C12A Take 27 mg by mouth at bedtime. 08/31/20   [provider]  ziprasidone (GEODON) 60 MG capsule Take 60 mg by mouth daily.    [provider]    Allergies    Bee venom, Invokana [canagliflozin], Shellfish allergy, Buprenorphine hcl, and Other  Review of Systems   Review of Systems  Gastrointestinal:  Positive for constipation (baseline).  Genitourinary:  Positive for difficulty urinating.  All other systems reviewed and are negative.  Physical Exam Updated Vital Signs BP 137/82   Pulse 99   Temp 99.1 F (37.3 C) (Oral)   Resp 18   SpO2 91%   Physical Exam Vitals and nursing note reviewed.  Constitutional:      General: She is not in acute distress.    Appearance: Normal appearance. She is  obese.     Comments: nontoxic  HENT:     Head: Normocephalic and atraumatic.  Eyes:     Conjunctiva/sclera: Conjunctivae normal.     Pupils: Pupils are equal, round, and reactive to light.  Cardiovascular:     Rate and Rhythm: Normal rate and regular rhythm.     Pulses: Normal pulses.  Pulmonary:     Effort: Pulmonary effort is normal. No respiratory distress.     Breath sounds: Normal breath sounds. No wheezing.     Comments: Speaking in full sentences.  Clear lung sounds in all fields. Abdominal:     General: There is no distension.     Palpations: Abdomen is soft. There is no mass.     Tenderness: There is abdominal tenderness. There is no guarding or rebound.     Comments: Ttp of suprapubic abd. No ttp elsewhere in the abd. No cva tenderness  Musculoskeletal:        General: Normal range of motion.     Cervical back:  Normal range of motion and neck supple.  Skin:    General: Skin is warm and dry.     Capillary Refill: Capillary refill takes less than 2 seconds.  Neurological:     Mental Status: She is alert and oriented to person, place, and time.  Psychiatric:        Mood and Affect: Mood and affect normal.        Speech: Speech normal.        Behavior: Behavior normal.    ED Results / Procedures / Treatments   Labs (all labs ordered are listed, but only abnormal results are displayed) Labs Reviewed  CBC WITH DIFFERENTIAL/PLATELET - Abnormal; Notable for the following components:      Result Value   HCT 35.8 (*)    All other components within normal limits  COMPREHENSIVE METABOLIC PANEL - Abnormal; Notable for the following components:   Sodium 132 (*)    Chloride 94 (*)    Glucose, Bld 139 (*)    BUN 24 (*)    Creatinine, Ser 1.13 (*)    AST 11 (*)    GFR, Estimated 54 (*)    All other components within normal limits  URINALYSIS, ROUTINE W REFLEX MICROSCOPIC    EKG None  Radiology CT ABDOMEN PELVIS W CONTRAST  Result Date: 11/10/2020 CLINICAL DATA:  Decreased urinary output over the last 2 days. Constipation for 5 days. Left upper quadrant abdominal pain. EXAM: CT ABDOMEN AND PELVIS WITH CONTRAST TECHNIQUE: Multidetector CT imaging of the abdomen and pelvis was performed using the standard protocol following bolus administration of intravenous contrast. CONTRAST:  144m OMNIPAQUE IOHEXOL 350 MG/ML SOLN COMPARISON:  04/02/2016 FINDINGS: Lower chest: Linear scarring and/or atelectasis.  No acute findings. Hepatobiliary: No focal liver abnormality is seen. Status post cholecystectomy. No biliary dilatation. Pancreas: Unremarkable. No pancreatic ductal dilatation or surrounding inflammatory changes. Spleen: Normal in size without focal abnormality. Adrenals/Urinary Tract: No adrenal masses. Mild bilateral renal cortical thinning. Small low-attenuation masses in the left kidney mid and lower  pole, consistent with cysts, but not fully characterized. Tiny cortical low-density lesion, midpole the right kidney, also likely a cyst. No stones. No hydronephrosis. Normal ureters. Bladder decompressed with a Foley catheter. Stomach/Bowel: Normal stomach. Small bowel and colon are normal in caliber. No wall thickening. No inflammation. Normal appendix visualized. Vascular/Lymphatic: Minor aortic atherosclerosis. No aneurysm. No enlarged lymph nodes. Reproductive: Status post hysterectomy. No adnexal masses. Other: Small fat containing umbilical hernia.  No ascites. Musculoskeletal: No fracture or acute finding.  No bone lesion. IMPRESSION: 1. No acute findings within the abdomen or pelvis. No findings to account for abdominal pain. 2. Mild aortic atherosclerosis. Electronically Signed   By: Lajean Manes M.D.   On: 11/10/2020 15:26    Procedures Procedures   Medications Ordered in ED Medications  iohexol (OMNIPAQUE) 350 MG/ML injection 100 mL (100 mLs Intravenous Contrast Given 11/10/20 1500)  sodium chloride (PF) 0.9 % injection (  Given by Other 11/10/20 1510)    ED Course  I have reviewed the triage vital signs and the nursing notes.  Pertinent labs & imaging results that were available during my care of the patient were reviewed by me and considered in my medical decision making (see chart for details).    MDM Rules/Calculators/A&P                           Pt presenting for evaluation of suprapubic abd pain and urinary retention. On exam, pt appears nontoxic. Mild ttp of the suprapubic abd. No ttp elsewhere in the abd. While pt also reports CN, this is not new for her. Will obtain labs, UA, bladder scan. Unfortunately bladder scan is not available. As such will place foley and obtain CT.   Labs interpreted by me, overall reassuring. SCr at baseline. Electrolytes reassuring. No leukocytosis.   Foley put out 1100 mL. Pt reports resolution of suprapubic pain.   CT negative for acute  findings. Discussed with pt. Will d/c with foley due to unknown cause and close f/u with urology (pt established with [facility]).  At this time, patient appear safe for discharge.  Return precautions given.  Patient states she understands and agrees to plan.  Final Clinical Impression(s) / ED Diagnoses Final diagnoses:  Urinary retention    Rx / DC Orders ED Discharge Orders     None        Franchot Heidelberg, PA-C 11/10/20 1617    Daleen Bo, MD 11/10/20 1857

## 2020-11-10 NOTE — Discharge Instructions (Signed)
Your work-up today was overall reassuring.  Your CT scan did not show any acute findings. Your blood work showed normal kidney function. There is no sign of urinary infection. You should follow-up with your urologist.  If you are unable to see them, you may follow-up with the urologist on-call instead.  However it is very important that you have close follow-up for further evaluation management of the Foley. Return to the emergency room if you develop fevers, severe worsening pain, no drainage from the Foley, or any new, worsening, or concerning symptoms

## 2020-11-11 ENCOUNTER — Emergency Department (HOSPITAL_COMMUNITY)
Admission: EM | Admit: 2020-11-11 | Discharge: 2020-11-11 | Disposition: A | Discharge status: Home / Self Care | Payer: Medicare Other | Attending: Emergency Medicine | Admitting: Emergency Medicine

## 2020-11-11 ENCOUNTER — Encounter (HOSPITAL_COMMUNITY): Payer: Self-pay

## 2020-11-11 DIAGNOSIS — Z96653 Presence of artificial knee joint, bilateral: Secondary | ICD-10-CM | POA: Insufficient documentation

## 2020-11-11 DIAGNOSIS — R109 Unspecified abdominal pain: Secondary | ICD-10-CM

## 2020-11-11 DIAGNOSIS — Z794 Long term (current) use of insulin: Secondary | ICD-10-CM | POA: Insufficient documentation

## 2020-11-11 DIAGNOSIS — Z7982 Long term (current) use of aspirin: Secondary | ICD-10-CM | POA: Diagnosis not present

## 2020-11-11 DIAGNOSIS — E1122 Type 2 diabetes mellitus with diabetic chronic kidney disease: Secondary | ICD-10-CM | POA: Diagnosis not present

## 2020-11-11 DIAGNOSIS — Z87891 Personal history of nicotine dependence: Secondary | ICD-10-CM | POA: Insufficient documentation

## 2020-11-11 DIAGNOSIS — Z79899 Other long term (current) drug therapy: Secondary | ICD-10-CM | POA: Insufficient documentation

## 2020-11-11 DIAGNOSIS — K219 Gastro-esophageal reflux disease without esophagitis: Secondary | ICD-10-CM | POA: Insufficient documentation

## 2020-11-11 DIAGNOSIS — N183 Chronic kidney disease, stage 3 unspecified: Secondary | ICD-10-CM | POA: Insufficient documentation

## 2020-11-11 DIAGNOSIS — Z8673 Personal history of transient ischemic attack (TIA), and cerebral infarction without residual deficits: Secondary | ICD-10-CM | POA: Diagnosis not present

## 2020-11-11 DIAGNOSIS — G20 Parkinson's disease: Secondary | ICD-10-CM | POA: Diagnosis not present

## 2020-11-11 DIAGNOSIS — E039 Hypothyroidism, unspecified: Secondary | ICD-10-CM | POA: Insufficient documentation

## 2020-11-11 DIAGNOSIS — R1030 Lower abdominal pain, unspecified: Secondary | ICD-10-CM | POA: Diagnosis present

## 2020-11-11 DIAGNOSIS — Z853 Personal history of malignant neoplasm of breast: Secondary | ICD-10-CM | POA: Diagnosis not present

## 2020-11-11 DIAGNOSIS — R339 Retention of urine, unspecified: Secondary | ICD-10-CM | POA: Insufficient documentation

## 2020-11-11 DIAGNOSIS — I129 Hypertensive chronic kidney disease with stage 1 through stage 4 chronic kidney disease, or unspecified chronic kidney disease: Secondary | ICD-10-CM | POA: Diagnosis not present

## 2020-11-11 DIAGNOSIS — Z87442 Personal history of urinary calculi: Secondary | ICD-10-CM | POA: Insufficient documentation

## 2020-11-11 DIAGNOSIS — J4530 Mild persistent asthma, uncomplicated: Secondary | ICD-10-CM | POA: Insufficient documentation

## 2020-11-11 DIAGNOSIS — R102 Pelvic and perineal pain: Secondary | ICD-10-CM | POA: Diagnosis not present

## 2020-11-11 MED ORDER — HYDROMORPHONE HCL 2 MG/ML IJ SOLN
2.0000 mg | Freq: Once | INTRAMUSCULAR | Status: AC
  Administered 2020-11-11: 2 mg via INTRAMUSCULAR
  Filled 2020-11-11: qty 1

## 2020-11-11 MED ORDER — HYDROCODONE-ACETAMINOPHEN 5-325 MG PO TABS: 1.0000 | ORAL_TABLET | Freq: Four times a day (QID) | ORAL | 0 refills | Status: DC | PRN

## 2020-11-11 MED ORDER — KETOROLAC TROMETHAMINE 60 MG/2ML IM SOLN
60.0000 mg | Freq: Once | INTRAMUSCULAR | Status: AC
  Administered 2020-11-11: 60 mg via INTRAMUSCULAR
  Filled 2020-11-11: qty 2

## 2020-11-11 NOTE — ED Provider Notes (Signed)
[facility] DEPT Provider Note   CSN: [id number] Arrival date & time: 11/11/20  0222     History No chief complaint on file.   [name] is a 65 y.o. female.  Patient is a 65 year old female with past medical history of morbid obesity, anemia, hypertension, hyperlipidemia.  Patient presenting today for evaluation of lower abdominal pain.  She was here earlier today for urinary retention of unclear etiology.  A Foley catheter was placed and patient was discharged to home.  From reviewing the records earlier today, 1100 cc of urine was obtained.  She returns today stating that she is having lower abdominal cramping and discomfort.  She denies any fevers or chills.  She denies any blood in the catheter.  The history is provided by the patient.      Past Medical History:  Diagnosis Date   Anemia    Anxiety    Arthritis    "pretty much all my joints"   Asthma    Benign paroxysmal positional vertigo 12/30/2012   Breast cancer ([place]) 02/13/12   ruq  100'clock bx Ductal Carcinoma in Situ,(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   Breast mass in female    L breast 2008, US showed likely fat necrosis   Chronic lower back pain    CKD (chronic kidney disease), stage III ([place])    "lower stage" (01/06/2014)   Daily headache    last time was 09/07/16 (brother passed away)   Depression    Dyspnea    Gait abnormality 05/29/2016   GERD (gastroesophageal reflux disease)    History of blood transfusion 2011   "after one of my OR's"   History of hiatal hernia    History of stomach ulcers    HSV (herpes simplex virus) infection    Hx of radiation therapy 05/05/12- 07/15/12   right breast, 45 gray x 25 fx, lumpectomy cavity boosted to 16.2 gray   Hyperlipemia    Hypertension    sees Dr. 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   Tachycardia    with sx in 2008   Thyroid disease    Type II diabetes mellitus ([place])    dx 2014    Patient Active Problem List   Diagnosis Date Noted   AMS (altered mental status) 09/29/2020   Hyperparathyroidism, secondary ([place]) 03/23/2018   Memory difficulty 12/22/2017   Spinal stenosis 06/09/2017   HNP (herniated nucleus pulposus), lumbar 12/05/2016   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   Diabetic neuropathy ([place]) 09/08/2016   Hypocalcemia 08/22/2016   Abnormal barium swallow 07/07/2016   Anxiety 07/07/2016   History of malignant neoplasm of breast 07/07/2016   Gait abnormality 05/29/2016   Encephalopathy acute 04/23/2016   Hyponatremia 04/23/2016   Schizophrenia, paranoid ([place])    Aspiration pneumonia of left lower lobe due to gastric secretions (HCC)    Projectile vomiting with nausea    Altered mental status    Schizophrenia ([place])    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[place])    Chronic pain syndrome    Acute renal failure (HCC)    Controlled diabetes mellitus type 2 with complications ([place])    Acute encephalopathy    Oropharyngeal dysphagia    Aspiration pneumonia of right lower lobe due to vomit ([place])    Acute renal failure (ARF) ([place]) 03/28/2016   Encephalopathy 03/27/2016   HCAP (healthcare-associated pneumonia) 02/29/2016   Pneumonia 02/14/2016   CAP (community acquired pneumonia) 02/13/2016   Edema 02/13/2016   AKI (acute kidney injury) ([place]) 02/13/2016   Elevated troponin 06/24/2015   Diarrhea 06/23/2015   Sepsis ([place]) 06/23/2015   Obesity, morbid, BMI 50 or higher ([place]) 03/26/2015   Post traumatic stress disorder (PTSD) 11/14/2014   Leukocytosis 11/12/2014   Slurred speech 11/12/2014    CKD (chronic kidney disease) stage 3, GFR 30-59 ml/min (HCC)    Diabetes mellitus, type II, insulin dependent ([place])    TIA (transient ischemic attack)    Hypotension 10/28/2014   Syncope 10/28/2014   Anemia 10/28/2014   Type II diabetes mellitus ([place]) 10/28/2014   OSA on CPAP 10/28/2014   Chronic lower back pain 10/28/2014   Polyneuropathy in diabetes(357.2) 10/28/2014   Diabetic polyneuropathy ([place])    Fall 08/14/2014   Recurrent falls 08/14/2014   Hot flashes 02/24/2014   Arthralgia 02/24/2014   Hair loss 02/24/2014   Chest pain 01/06/2014   Shock ([place]) 08/08/2013   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   Dysarthria 12/30/2012   Benign paroxysmal positional vertigo 12/30/2012   Breast cancer of upper-outer quadrant of right female breast ([place]) 11/18/2012   Hemorrhage of rectum and anus 09/28/2012   Hx of radiation therapy    Syncope and collapse 06/04/2011   HSV 10/05/2008   Mild persistent asthma 06/02/2008   Dyspnea 07/19/2007   HLD (hyperlipidemia) 06/02/2006   Sinus tachycardia 05/25/2006   Hypothyroidism 02/27/2006   Depression 02/27/2006   Essential hypertension 02/27/2006   GERD 02/27/2006   KNEE PAIN 02/27/2006    Past Surgical History:  Procedure Laterality Date   ABDOMINAL HYSTERECTOMY  1979?   partial   benign cyst  Left    removed from left breast   BREAST BIOPSY Right 02/13/2012   BREAST LUMPECTOMY  03/03/2012   Procedure: LUMPECTOMY;  Surgeon: Haywood Lasso, MD;  Location: [facility];  Service: General;  Laterality: Right;   BREAST SURGERY Right 01/2012   "cancer"   CHOLECYSTECTOMY  1980's?   COLONOSCOPY WITH PROPOFOL N/A 09/28/2012   Procedure: COLONOSCOPY WITH PROPOFOL;  Surgeon: Lear Ng, MD;  Location: [facility];  Service: Endoscopy;  Laterality: N/A;   FOOT FRACTURE SURGERY Right 1990's   JOINT REPLACEMENT      x 3   LUMBAR LAMINECTOMY/DECOMPRESSION MICRODISCECTOMY N/A 12/05/2016   Procedure: LEFT L5-S1 MICRODISCECTOMY;   Surgeon: Marybelle Killings, MD;  Location: [facility];  Service: Orthopedics;  Laterality: N/A;   REVISION TOTAL KNEE ARTHROPLASTY  07/2009   SHOULDER OPEN ROTATOR CUFF REPAIR Left 1990's   THYROIDECTOMY  1970's   TOTAL KNEE ARTHROPLASTY Bilateral 2009-06/2009   left; right     OB History   No obstetric history on file.     Family History  Problem Relation Age of Onset   Heart disease Brother        Multiple MIs, starting in his 52s   Diabetes Brother    Thyroid disease Brother    Hypertension Mother    Diabetes Mother    Breast cancer Mother 90   Bone cancer Mother  Hypertension Sister    Diabetes Sister    Breast cancer Sister 15   Thyroid disease Sister    Breast cancer Maternal Grandmother    Heart disease Maternal Grandmother    Uterine cancer Other 19   Breast cancer Paternal Aunt 65   Breast cancer Paternal Grandmother        dx in her 27s   Prostate cancer Paternal Grandfather    Asthma Son     Social History   Tobacco Use   Smoking status: Former    Packs/day: 1.00    Years: 7.00    Pack years: 7.00    Types: Cigarettes    Quit date: 03/24/1992    Years since quitting: 28.6   Smokeless tobacco: Never  Vaping Use   Vaping Use: Never used  Substance Use Topics   Alcohol use: No    Comment: "stopped drinking in 1996; I was an alcoholic"   Drug use: No    Home Medications Prior to Admission medications   Medication Sig Start Date End Date Taking? Authorizing Provider  albuterol (VENTOLIN HFA) 108 (90 Base) MCG/ACT inhaler Inhale 2 puffs into the lungs every 4 (four) hours as needed for wheezing or shortness of breath.     [provider]  amLODipine (NORVASC) 10 MG tablet Take 1 tablet (10 mg total) by mouth daily. 10/07/20   Shawna Clamp, MD  aspirin EC 81 MG tablet Take 1 tablet (81 mg total) by mouth daily. 08/12/13   Viyuoh, Alison Stalling, MD  B Complex-C (B-COMPLEX WITH VITAMIN C) tablet Take 1 tablet by mouth daily.    [provider]   calcitRIOL (ROCALTROL) 0.25 MCG capsule Take 1 capsule (0.25 mcg total) by mouth 3 (three) times a week. 10/19/20   Renato Shin, MD  Cholecalciferol (VITAMIN D-3) 1000 units CAPS Take 1,000 Units by mouth daily.    [provider]  Cyanocobalamin (VITAMIN B-12 PO) Take 1 tablet by mouth daily.    [provider]  cyclobenzaprine (FLEXERIL) 10 MG tablet Take 10 mg by mouth at bedtime as needed for muscle spasms. 08/31/20   [provider]  doxepin (SINEQUAN) 50 MG capsule Take 50-100 mg by mouth at bedtime as needed for sleep. 09/24/20   [provider]  esomeprazole (NEXIUM) 40 MG capsule Take 1 capsule (40 mg total) by mouth daily before breakfast. Patient taking differently: Take 40 mg by mouth daily at 12 noon. 10/30/14   Barton Dubois, MD  famotidine (PEPCID) 40 MG tablet Take 80 mg by mouth daily as needed for indigestion. 06/16/20   [provider]  furosemide (LASIX) 20 MG tablet Take 2 tablets (40 mg total) by mouth daily. 09/09/19   Renato Shin, MD  Insulin NPH, Human,, Isophane, (HUMULIN N KWIKPEN) 100 UNIT/ML Kiwkpen Inject 34 Units into the skin every morning. And pen needles 1/day 07/10/20   Renato Shin, MD  Iron-Vitamins (GERITOL PO) Take 1 tablet by mouth daily.    [provider]  Lancets [facility] DELICA PLUS IZTIWP80D) [place] 1 each by Other route 3 (three) times daily. Use to check blood sugar 3 times a day 02/16/19   Renato Shin, MD  levothyroxine (SYNTHROID) 112 MCG tablet Take 2 tablets (224 mcg total) by mouth daily at 6 (six) AM. 10/07/20   Shawna Clamp, MD  linaclotide (LINZESS) 290 MCG CAPS capsule Take 290 mcg by mouth daily before breakfast.    [provider]  LORazepam (ATIVAN) 0.5 MG tablet Take 1 tablet (0.5  mg total) by mouth every 8 (eight) hours as needed for anxiety. 10/06/20   Shawna Clamp, MD  losartan-hydrochlorothiazide (HYZAAR) 100-25 MG tablet Take 1 tablet by mouth daily. 09/17/20   [provider]  lubiprostone (AMITIZA) 24 MCG capsule Take 24 mcg by mouth 2 (two) times daily. 06/22/20   [provider]  methocarbamol (ROBAXIN) 750 MG tablet Take 750 mg by mouth 3 (three) times daily as needed for muscle spasms. 08/31/20   [provider]  Multiple Vitamin (MULTIVITAMIN WITH MINERALS) TABS tablet Take 1 tablet by mouth daily.    [provider]  naloxone [facility]) nasal spray 4 mg/0.1 mL Place 1 spray into the nose as needed for opioid reversal. 08/01/20   [provider]  [facility] VERIO test strip TEST BLOOD SUGAR THREE TIMES DAILY 03/07/20   Renato Shin, MD  oxyCODONE (ROXICODONE) 15 MG immediate release tablet Take 1 tablet (15 mg total) by mouth every 6 (six) hours as needed for pain. 10/06/20   Shawna Clamp, MD  potassium chloride SA (KLOR-CON) 20 MEQ tablet Take 20 mEq by mouth daily. 06/22/20   [provider]  pregabalin (LYRICA) 300 MG capsule Take 300 mg by mouth 2 (two) times daily.    [provider]  rosuvastatin (CRESTOR) 20 MG tablet Take 20 mg by mouth daily.    [provider]  sucralfate (CARAFATE) 1 g tablet Take 1 g by mouth 4 (four) times daily as needed. 08/13/20   [provider]  SURE COMFORT PEN NEEDLES 32G X 6 MM MISC USE TO INJECT INSULIN FOUR TIMES DAILY 11/07/20   Renato Shin, MD  tolterodine (DETROL LA) 2 MG 24 hr capsule Take 2 mg by mouth daily. 09/24/20   [provider]  TRULICITY 4.5 EP/3.2RJ SOPN Inject 4.5 mg into the skin once a week. 09/06/20   Renato Shin, MD  valACYclovir (VALTREX) 1000 MG tablet Take 1,000 mg by mouth daily.    [provider]  venlafaxine XR (EFFEXOR-XR) 150 MG 24 hr capsule Take 150 mg by mouth 2 (two) times daily. 05/21/15   [provider]  Vitamin D, Ergocalciferol, (DRISDOL) 1.25 MG (50000 UNIT) CAPS capsule Take 50,000 Units by mouth once a week. 09/17/20   [provider]  XTAMPZA ER 27 MG C12A Take 27 mg by mouth  at bedtime. 08/31/20   [provider]  ziprasidone (GEODON) 60 MG capsule Take 60 mg by mouth daily.    [provider]    Allergies    Bee venom, Invokana [canagliflozin], Shellfish allergy, Buprenorphine hcl, and Other  Review of Systems   Review of Systems  All other systems reviewed and are negative.  Physical Exam Updated Vital Signs BP (!) 144/76 (BP Location: Left Arm)   Pulse (!) 102   Temp 99 F (37.2 C) (Oral)   Resp 16   SpO2 95%   Physical Exam Vitals and nursing note reviewed.  Constitutional:      General: She is not in acute distress.    Appearance: She is well-developed. She is not diaphoretic.  HENT:     Head: Normocephalic and atraumatic.  Cardiovascular:     Rate and Rhythm: Normal rate and regular rhythm.     Heart sounds: No murmur heard.   No friction rub. No gallop.  Pulmonary:     Effort: Pulmonary effort is normal. No respiratory distress.     Breath sounds: Normal breath sounds. No wheezing.  Abdominal:  General: Bowel sounds are normal. There is no distension.     Palpations: Abdomen is soft.     Tenderness: There is abdominal tenderness. There is no guarding or rebound.     Comments: Abdomen is obese.  There is mild suprapubic tenderness, but no rebound or guarding.  Musculoskeletal:        General: Normal range of motion.     Cervical back: Normal range of motion and neck supple.  Skin:    General: Skin is warm and dry.  Neurological:     General: No focal deficit present.     Mental Status: She is alert and oriented to person, place, and time.    ED Results / Procedures / Treatments   Labs (all labs ordered are listed, but only abnormal results are displayed) Labs Reviewed - No data to display  EKG None  Radiology CT ABDOMEN PELVIS W CONTRAST  Result Date: 11/10/2020 CLINICAL DATA:  Decreased urinary output over the last 2 days. Constipation for 5 days. Left upper quadrant abdominal pain. EXAM: CT ABDOMEN  AND PELVIS WITH CONTRAST TECHNIQUE: Multidetector CT imaging of the abdomen and pelvis was performed using the standard protocol following bolus administration of intravenous contrast. CONTRAST:  167m OMNIPAQUE IOHEXOL 350 MG/ML SOLN COMPARISON:  04/02/2016 FINDINGS: Lower chest: Linear scarring and/or atelectasis.  No acute findings. Hepatobiliary: No focal liver abnormality is seen. Status post cholecystectomy. No biliary dilatation. Pancreas: Unremarkable. No pancreatic ductal dilatation or surrounding inflammatory changes. Spleen: Normal in size without focal abnormality. Adrenals/Urinary Tract: No adrenal masses. Mild bilateral renal cortical thinning. Small low-attenuation masses in the left kidney mid and lower pole, consistent with cysts, but not fully characterized. Tiny cortical low-density lesion, midpole the right kidney, also likely a cyst. No stones. No hydronephrosis. Normal ureters. Bladder decompressed with a Foley catheter. Stomach/Bowel: Normal stomach. Small bowel and colon are normal in caliber. No wall thickening. No inflammation. Normal appendix visualized. Vascular/Lymphatic: Minor aortic atherosclerosis. No aneurysm. No enlarged lymph nodes. Reproductive: Status post hysterectomy. No adnexal masses. Other: Small fat containing umbilical hernia.  No ascites. Musculoskeletal: No fracture or acute finding.  No bone lesion. IMPRESSION: 1. No acute findings within the abdomen or pelvis. No findings to account for abdominal pain. 2. Mild aortic atherosclerosis. Electronically Signed   By: DLajean ManesM.D.   On: 11/10/2020 15:26    Procedures Procedures   Medications Ordered in ED Medications  HYDROmorphone (DILAUDID) injection 2 mg (has no administration in time range)  ketorolac (TORADOL) injection 60 mg (has no administration in time range)    ED Course  I have reviewed the triage vital signs and the nursing notes.  Pertinent labs & imaging results that were available during my  care of the patient were reviewed by me and considered in my medical decision making (see chart for details).    MDM Rules/Calculators/A&P  Patient presenting here with complaints of pelvic pain.  Patient was seen earlier this morning for urinary retention and a Foley catheter was placed.  She had laboratory studies, urinalysis, and CT scan performed at that time, all of which were unremarkable and the cause of her urinary retention was not defined.  She returns with cramping of the lower abdomen in the suprapubic region.  I suspect the patient is having bladder spasms related to the Foley catheter.  Her vital signs are stable and she is afebrile.  Her work-up was less than 24 hours ago and I do not feel as though additional tests  are indicated.  Her pain was treated and seems appropriate for discharge.  Patient will be given medication for pain and advised to follow-up with urology in the next few days.  Final Clinical Impression(s) / ED Diagnoses Final diagnoses:  None    Rx / DC Orders ED Discharge Orders     None        Veryl Speak, MD 11/11/20 [phone]

## 2020-11-11 NOTE — Discharge Instructions (Signed)
Take hydrocodone as prescribed as needed for pain.  Follow-up your urinary retention with urology.  The contact information for [facility] has been provided in this discharge summary for you to call and make these arrangements.

## 2020-11-11 NOTE — ED Triage Notes (Signed)
Patient arrives from home. Pt discharged earlier today and brought back by [facility] due to complaint of ongoing abdominal pain LLQ, LUQ, RUQ. Pt rates pain 10/10.

## 2020-11-13 ENCOUNTER — Other Ambulatory Visit: Payer: Self-pay

## 2020-11-13 ENCOUNTER — Emergency Department (HOSPITAL_COMMUNITY)
Admission: EM | Admit: 2020-11-13 | Discharge: 2020-11-14 | Disposition: A | Discharge status: Home / Self Care | Payer: Medicare Other | Attending: Emergency Medicine | Admitting: Emergency Medicine

## 2020-11-13 DIAGNOSIS — N183 Chronic kidney disease, stage 3 unspecified: Secondary | ICD-10-CM | POA: Diagnosis not present

## 2020-11-13 DIAGNOSIS — I129 Hypertensive chronic kidney disease with stage 1 through stage 4 chronic kidney disease, or unspecified chronic kidney disease: Secondary | ICD-10-CM | POA: Diagnosis not present

## 2020-11-13 DIAGNOSIS — Z87891 Personal history of nicotine dependence: Secondary | ICD-10-CM | POA: Diagnosis not present

## 2020-11-13 DIAGNOSIS — Z79899 Other long term (current) drug therapy: Secondary | ICD-10-CM | POA: Insufficient documentation

## 2020-11-13 DIAGNOSIS — E1142 Type 2 diabetes mellitus with diabetic polyneuropathy: Secondary | ICD-10-CM | POA: Diagnosis not present

## 2020-11-13 DIAGNOSIS — T83511A Infection and inflammatory reaction due to indwelling urethral catheter, initial encounter: Secondary | ICD-10-CM | POA: Diagnosis not present

## 2020-11-13 DIAGNOSIS — R109 Unspecified abdominal pain: Secondary | ICD-10-CM | POA: Diagnosis present

## 2020-11-13 DIAGNOSIS — Z794 Long term (current) use of insulin: Secondary | ICD-10-CM | POA: Insufficient documentation

## 2020-11-13 DIAGNOSIS — N3289 Other specified disorders of bladder: Secondary | ICD-10-CM | POA: Insufficient documentation

## 2020-11-13 DIAGNOSIS — Z7982 Long term (current) use of aspirin: Secondary | ICD-10-CM | POA: Diagnosis not present

## 2020-11-13 DIAGNOSIS — Z853 Personal history of malignant neoplasm of breast: Secondary | ICD-10-CM | POA: Diagnosis not present

## 2020-11-13 DIAGNOSIS — Z96653 Presence of artificial knee joint, bilateral: Secondary | ICD-10-CM | POA: Diagnosis not present

## 2020-11-13 DIAGNOSIS — J45909 Unspecified asthma, uncomplicated: Secondary | ICD-10-CM | POA: Insufficient documentation

## 2020-11-13 DIAGNOSIS — E039 Hypothyroidism, unspecified: Secondary | ICD-10-CM | POA: Insufficient documentation

## 2020-11-13 DIAGNOSIS — E1122 Type 2 diabetes mellitus with diabetic chronic kidney disease: Secondary | ICD-10-CM | POA: Insufficient documentation

## 2020-11-13 DIAGNOSIS — N390 Urinary tract infection, site not specified: Secondary | ICD-10-CM

## 2020-11-13 LAB — WET PREP, GENITAL
Sperm: NONE SEEN
Trich, Wet Prep: NONE SEEN
WBC, Wet Prep HPF POC: NONE SEEN
Yeast Wet Prep HPF POC: NONE SEEN

## 2020-11-13 NOTE — ED Provider Notes (Signed)
[facility] DEPT Provider Note: Georgena Spurling, MD, FACEP  CSN: [id number] MRN: [id number] ARRIVAL: 11/13/20 at 2137 ROOM: [place]  Abdominal Pain   HISTORY OF PRESENT ILLNESS  11/13/20 11:34 PM [name] is a 65 y.o. female who was seen on 11/10/2020 for urinary retention and had a Foley catheter placed, yielding 1100 mL of urine.  She had had difficulty urinating for the previous 3 days.  CT at that time was unremarkable.  She was referred back to her urologist at [facility].  She returned on 11/11/2020 for lower abdominal cramping and discomfort.  She was diagnosed with bladder spasms.  She returns this evening with fever for the past 3 days (last dose of Tylenol at 8 PM today).  She also has a rash around the catheter site, cloudy urine as well as leaking from the catheter bag.  She rates her pain at the catheter site is a 10 out of 10, worse with movement of the catheter.  Her temperature on arrival was 100.5.    Past Medical History:  Diagnosis Date   Anemia    Anxiety    Arthritis    "pretty much all my joints"   Asthma    Benign paroxysmal positional vertigo 12/30/2012   Breast cancer ([place]) 02/13/12   ruq  100'clock bx Ductal Carcinoma in Situ,(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   Breast mass in female    L breast 2008, US showed likely fat necrosis   Chronic lower back pain    CKD (chronic kidney disease), stage III ([place])    "lower stage" (01/06/2014)   Daily headache    last time was September 02, 2016 (brother passed away)   Depression    Dyspnea    Gait abnormality 05/29/2016   GERD (gastroesophageal reflux disease)    History of blood transfusion 2011   "after one of my OR's"   History of hiatal hernia    History of stomach ulcers    HSV (herpes simplex virus) infection    Hx of radiation therapy 05/05/12- 07/15/12   right breast, 45 gray x 25 fx, lumpectomy cavity boosted to 16.2 gray   Hyperlipemia    Hypertension    sees Dr. 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   partial   benign cyst  Left    removed from left breast   BREAST BIOPSY Right 02/13/2012   BREAST LUMPECTOMY  03/03/2012   Procedure: LUMPECTOMY;  Surgeon: Haywood Lasso, MD;  Location: [facility];  Service: General;  Laterality: Right;   BREAST SURGERY Right 01/2012   "cancer"   CHOLECYSTECTOMY  1980's?   COLONOSCOPY WITH PROPOFOL N/A 09/28/2012   Procedure: COLONOSCOPY WITH PROPOFOL;  Surgeon: Lear Ng, MD;  Location: [facility];  Service:  Endoscopy;  Laterality: N/A;   FOOT FRACTURE SURGERY Right 1990's   JOINT REPLACEMENT      x 3   LUMBAR LAMINECTOMY/DECOMPRESSION MICRODISCECTOMY N/A 12/05/2016   Procedure: LEFT L5-S1 MICRODISCECTOMY;  Surgeon: Marybelle Killings, MD;  Location: [facility];  Service: Orthopedics;  Laterality: N/A;   REVISION TOTAL KNEE ARTHROPLASTY  07/2009   SHOULDER OPEN ROTATOR CUFF REPAIR Left 1990's   THYROIDECTOMY  1970's   TOTAL KNEE ARTHROPLASTY Bilateral 2009-06/2009   left; right    Family History  Problem Relation Age of Onset   Heart disease Brother        Multiple MIs, starting in his 42s   Diabetes Brother    Thyroid disease Brother    Hypertension Mother    Diabetes Mother    Breast cancer Mother 79   Bone cancer Mother    Hypertension Sister    Diabetes Sister    Breast cancer Sister 80   Thyroid disease Sister    Breast cancer Maternal Grandmother    Heart  disease Maternal Grandmother    Uterine cancer Other 19   Breast cancer Paternal Aunt 46   Breast cancer Paternal Grandmother        dx in her 19s   Prostate cancer Paternal Grandfather    Asthma Son     Social History   Tobacco Use   Smoking status: Former    Packs/day: 1.00    Years: 7.00    Pack years: 7.00    Types: Cigarettes    Quit date: 03/24/1992    Years since quitting: 28.6   Smokeless tobacco: Never  Vaping Use   Vaping Use: Never used  Substance Use Topics   Alcohol use: No    Comment: "stopped drinking in 1996; I was an alcoholic"   Drug use: No    Prior to Admission medications   Medication Sig Start Date End Date Taking? Authorizing Provider  albuterol (VENTOLIN HFA) 108 (90 Base) MCG/ACT inhaler Inhale 2 puffs into the lungs every 4 (four) hours as needed for wheezing or shortness of breath.    Yes [provider]  amLODipine (NORVASC) 10 MG tablet Take 1 tablet (10 mg total) by mouth daily. 10/07/20  Yes Shawna Clamp, MD  aspirin EC 81 MG tablet Take 1 tablet (81 mg total) by mouth daily. 08/12/13  Yes Viyuoh, Adeline C, MD  B Complex-C (B-COMPLEX WITH VITAMIN C) tablet Take 1 tablet by mouth daily.   Yes [provider]  belladonna-opium (B&O SUPPRETTES) 16.2-30 MG suppository Place 1 suppository rectally 2 (two) times daily as needed (for bladder spasms). 11/14/20  Yes Lysbeth Dicola, MD  calcitRIOL (ROCALTROL) 0.25 MCG capsule Take 1 capsule (0.25 mcg total) by mouth 3 (three) times a week. Patient taking differently: Take 0.25 mcg by mouth 3 (three) times a week. Taken Monday, Wednesday, Friday 10/19/20  Yes Renato Shin, MD  cefUROXime (CEFTIN) 500 MG tablet Take 1 tablet (500 mg total) by mouth 2 (two) times daily with a meal. 11/14/20  Yes Sheika Coutts, MD  Cholecalciferol (VITAMIN D-3) 1000 units CAPS Take 1,000 Units by mouth daily.   Yes [provider]  Cyanocobalamin (VITAMIN B-12 PO) Take 1 tablet by mouth daily.   Yes  [provider]  cyclobenzaprine (FLEXERIL) 10 MG tablet Take 10 mg by mouth at bedtime as needed for muscle spasms. 08/31/20  Yes [provider]  doxepin (SINEQUAN) 50 MG capsule Take 50-100 mg by mouth at bedtime as needed for  sleep. 09/24/20  Yes [provider]  esomeprazole (NEXIUM) 40 MG capsule Take 1 capsule (40 mg total) by mouth daily before breakfast. Patient taking differently: Take 40 mg by mouth daily at 12 noon. 10/30/14  Yes Barton Dubois, MD  furosemide (LASIX) 20 MG tablet Take 2 tablets (40 mg total) by mouth daily. 09/09/19  Yes Renato Shin, MD  Insulin NPH, Human,, Isophane, (HUMULIN N KWIKPEN) 100 UNIT/ML Kiwkpen Inject 34 Units into the skin every morning. And pen needles 1/day 07/10/20  Yes Renato Shin, MD  levothyroxine (SYNTHROID) 200 MCG tablet Take 200 mcg by mouth every morning. 10/30/20  Yes [provider]  linaclotide (LINZESS) 290 MCG CAPS capsule Take 290 mcg by mouth daily before breakfast.   Yes [provider]  LORazepam (ATIVAN) 0.5 MG tablet Take 1 tablet (0.5 mg total) by mouth every 8 (eight) hours as needed for anxiety. 10/06/20  Yes Shawna Clamp, MD  losartan-hydrochlorothiazide (HYZAAR) 100-25 MG tablet Take 1 tablet by mouth daily. 09/17/20  Yes [provider]  lubiprostone (AMITIZA) 24 MCG capsule Take 24 mcg by mouth 2 (two) times daily. 06/22/20  Yes [provider]  methocarbamol (ROBAXIN) 750 MG tablet Take 750 mg by mouth 3 (three) times daily as needed for muscle spasms. 08/31/20  Yes [provider]  naloxone [facility]) nasal spray 4 mg/0.1 mL Place 1 spray into the nose as needed for opioid reversal. 08/01/20  Yes [provider]  potassium chloride SA (KLOR-CON) 20 MEQ tablet Take 20 mEq by mouth daily. 06/22/20  Yes [provider]  pregabalin (LYRICA) 300 MG capsule Take 300 mg by mouth 2 (two) times daily.   Yes [provider]  rosuvastatin (CRESTOR) 20  MG tablet Take 20 mg by mouth daily.   Yes [provider]  sucralfate (CARAFATE) 1 g tablet Take 1 g by mouth 4 (four) times daily as needed (for ulcers). 08/13/20  Yes [provider]  tolterodine (DETROL LA) 2 MG 24 hr capsule Take 2 mg by mouth daily. 09/24/20  Yes [provider]  TRULICITY 4.5 DX/8.3JA SOPN Inject 4.5 mg into the skin once a week. 09/06/20  Yes Renato Shin, MD  valACYclovir (VALTREX) 1000 MG tablet Take 1,000 mg by mouth daily.   Yes [provider]  venlafaxine XR (EFFEXOR-XR) 150 MG 24 hr capsule Take 150 mg by mouth 2 (two) times daily. 05/21/15  Yes [provider]  ziprasidone (GEODON) 60 MG capsule Take 60 mg by mouth daily.   Yes [provider]  famotidine (PEPCID) 40 MG tablet Take 80 mg by mouth daily as needed for indigestion. 06/16/20   [provider]  Lancets [facility] DELICA PLUS [id number]) Fellows 1 each by Other route 3 (three) times daily. Use to check blood sugar 3 times a day 02/16/19   Renato Shin, MD  levothyroxine (SYNTHROID) 112 MCG tablet Take 2 tablets (224 mcg total) by mouth daily at 6 (six) AM. Patient not taking: Reported on 11/13/2020 10/07/20   Shawna Clamp, MD  [facility] VERIO test strip TEST BLOOD SUGAR THREE TIMES DAILY 03/07/20   Renato Shin, MD  SURE COMFORT PEN NEEDLES 32G X 6 MM MISC USE TO INJECT INSULIN FOUR TIMES DAILY 11/07/20   Renato Shin, MD  Vitamin D, Ergocalciferol, (DRISDOL) 1.25 MG (50000 UNIT) CAPS capsule Take 50,000 Units by mouth once a week. 09/17/20   [provider]  XTAMPZA ER 27 MG C12A Take 27 mg by mouth at bedtime. Patient not  taking: No sig reported 08/31/20   [provider]    Allergies Bee venom, Invokana [canagliflozin], Shellfish allergy, Buprenorphine hcl, and Other   REVIEW OF SYSTEMS  Negative except as noted here or in the History of Present Illness.   PHYSICAL EXAMINATION  Initial Vital Signs Blood pressure (!) 163/91,  pulse (!) 101, temperature 100 F (37.8 C), temperature source Oral, resp. rate 20, height 5' 5" (1.651 m), weight 136.1 kg, SpO2 94 %.  Examination General: Well-developed, high BMI female in no acute distress; appearance consistent with age of record HENT: normocephalic; atraumatic Eyes: pupils equal, round and reactive to light; extraocular muscles intact Neck: supple Heart: regular rate and rhythm Lungs: clear to auscultation bilaterally Abdomen: soft; no suprapubic tenderness; bowel sounds present GU: Foley catheter in place with purulent drainage around urethra; specimen sent for wet prep) Extremities: No deformity; full range of motion; pulses normal Neurologic: Awake, alert and oriented; motor function intact in all extremities and symmetric; no facial droop Skin: Warm and dry Psychiatric: Moaning due to discomfort   RESULTS  Summary of this visit's results, reviewed and interpreted by myself:   EKG Interpretation  Date/Time:    Ventricular Rate:    PR Interval:    QRS Duration:   QT Interval:    QTC Calculation:   R Axis:     Text Interpretation:         Laboratory Studies: Results for orders placed or performed during the hospital encounter of 11/13/20 (from the past 24 hour(s))  Wet prep, genital     Status: Abnormal   Collection Time: 11/13/20 11:55 PM   Specimen: Genital  Result Value Ref Range   Yeast Wet Prep HPF POC NONE SEEN NONE SEEN   Trich, Wet Prep NONE SEEN NONE SEEN   Clue Cells Wet Prep HPF POC PRESENT (A) NONE SEEN   WBC, Wet Prep HPF POC NONE SEEN NONE SEEN   Sperm NONE SEEN   Urinalysis, Routine w reflex microscopic     Status: Abnormal   Collection Time: 11/14/20 12:05 AM  Result Value Ref Range   Color, Urine YELLOW YELLOW   APPearance CLEAR CLEAR   Specific Gravity, Urine 1.025 1.005 - 1.030   pH 6.0 5.0 - 8.0   Glucose, UA NEGATIVE NEGATIVE mg/dL   Hgb urine dipstick LARGE (A) NEGATIVE   Bilirubin Urine NEGATIVE NEGATIVE    Ketones, ur NEGATIVE NEGATIVE mg/dL   Protein, ur 30 (A) NEGATIVE mg/dL   Nitrite NEGATIVE NEGATIVE   Leukocytes,Ua SMALL (A) NEGATIVE   RBC / HPF 11-20 0 - 5 RBC/hpf   WBC, UA >50 (H) 0 - 5 WBC/hpf   Bacteria, UA RARE (A) NONE SEEN   Squamous Epithelial / LPF 0-5 0 - 5   WBC Clumps PRESENT    Mucus PRESENT    *Note: Due to a large number of results and/or encounters for the requested time period, some results have not been displayed. A complete set of results can be found in Results Review.   Imaging Studies: No results found.  ED COURSE and MDM  Nursing notes, initial and subsequent vitals signs, including pulse oximetry, reviewed and interpreted by myself.  Vitals:   11/14/20 0230 11/14/20 0300 11/14/20 0330 11/14/20 0400  BP: (!) 153/92 127/74 129/71 119/78  Pulse: 86 84 82 82  Resp:   18   Temp:      TempSrc:      SpO2: 95% 95% 92% 93%  Weight:  Height:       Medications  ondansetron (ZOFRAN) injection 4 mg (4 mg Intravenous Given 11/14/20 0029)  fentaNYL (SUBLIMAZE) injection 100 mcg (100 mcg Intravenous Given 11/14/20 0030)  cefTRIAXone (ROCEPHIN) 1 g in sodium chloride 0.9 % 100 mL IVPB (0 g Intravenous Stopped 11/14/20 0408)   2:53 AM No residual urine in bladder on bedside bladder scan.  Rocephin 1 g given for catheter associated urinary tract infection.  Pain likely due to urinary tract infection and/or bladder spasms.  We will treat infection and treat pain, spasms with B&O suppositories and refer to urology.   PROCEDURES  Procedures   ED DIAGNOSES     ICD-10-CM   1. Urinary tract infection associated with indwelling urethral catheter, initial encounter ([place])  T83.511A    N39.0     2. Bladder spasms  N32.89          Shanon Rosser, MD 11/14/20 [phone]

## 2020-11-13 NOTE — ED Triage Notes (Signed)
Patient is from home. Patient was seen on Saturday for foley catheter placement. Patient has had a fever since Saturday (treated with tylenol, last dose at 8pm today). Today she has a rash around entry site of catheter, cloudy urine, and leaking from catheter bag.

## 2020-11-14 DIAGNOSIS — T83511A Infection and inflammatory reaction due to indwelling urethral catheter, initial encounter: Secondary | ICD-10-CM | POA: Diagnosis not present

## 2020-11-14 LAB — URINALYSIS, ROUTINE W REFLEX MICROSCOPIC
Bilirubin Urine: NEGATIVE
Glucose, UA: NEGATIVE mg/dL
Ketones, ur: NEGATIVE mg/dL
Nitrite: NEGATIVE
Protein, ur: 30 mg/dL — AB
Specific Gravity, Urine: 1.025 (ref 1.005–1.030)
WBC, UA: >50 WBC/hpf — ABNORMAL HIGH (ref 0–5)
pH: 6 (ref 5.0–8.0)

## 2020-11-14 MED ORDER — SODIUM CHLORIDE 0.9 % IV SOLN
1.0000 g | Freq: Once | INTRAVENOUS | Status: AC
  Administered 2020-11-14: 1 g via INTRAVENOUS
  Filled 2020-11-14: qty 10

## 2020-11-14 MED ORDER — FENTANYL CITRATE PF 50 MCG/ML IJ SOSY
100.0000 ug | PREFILLED_SYRINGE | Freq: Once | INTRAMUSCULAR | Status: AC
  Administered 2020-11-14: 100 ug via INTRAVENOUS
  Filled 2020-11-14: qty 2

## 2020-11-14 MED ORDER — CEFUROXIME AXETIL 500 MG PO TABS: 500.0000 mg | ORAL_TABLET | Freq: Two times a day (BID) | ORAL | 0 refills | Status: DC

## 2020-11-14 MED ORDER — ONDANSETRON HCL 4 MG/2ML IJ SOLN
4.0000 mg | Freq: Once | INTRAMUSCULAR | Status: AC
  Administered 2020-11-14: 4 mg via INTRAVENOUS
  Filled 2020-11-14: qty 2

## 2020-11-14 MED ORDER — BELLADONNA ALKALOIDS-OPIUM 16.2-30 MG RE SUPP: 1.0000 | Freq: Two times a day (BID) | RECTAL | 0 refills | Status: DC | PRN

## 2020-11-14 NOTE — ED Notes (Signed)
Foley catheter bag changed due to patient stating it was leaking.

## 2020-11-14 NOTE — ED Notes (Signed)
2m of urine per bladder scan

## 2020-11-14 NOTE — ED Notes (Signed)
[facility] called for transport.  

## 2020-11-14 NOTE — Discharge Instructions (Addendum)
Follow-up with your urologist as soon as possible.  You have a urinary tract infection associated with your Foley catheter and your pain is likely due to a combination of your urinary tract infection as well as bladder spasms.

## 2020-11-15 LAB — URINE CULTURE: Culture: 90000 — AB

## 2020-11-16 ENCOUNTER — Telehealth: Payer: Self-pay | Admitting: *Deleted

## 2020-11-16 NOTE — Telephone Encounter (Signed)
Post ED Visit - Positive Culture Follow-up  Culture report reviewed by antimicrobial stewardship pharmacist: [place] Team _0  Elenor Quinones, Pharm.D. _1  Heide Guile, Pharm.D., BCPS AQ-ID _2  Parks Neptune, Pharm.D., BCPS _3  Alycia Rossetti, Pharm.D., BCPS _4  [place], [place].D., BCPS, AAHIVP _5  Legrand Como, Pharm.D., BCPS, AAHIVP _6  Salome Arnt, PharmD, BCPS _7  Johnnette Gourd, PharmD, BCPS _8  Hughes Better, PharmD, BCPS _9  Leeroy Cha, PharmD _10  Laqueta Linden, PharmD, BCPS _11  Albertina Parr, PharmD  [place] Team _12  Leodis Sias, PharmD _13  Lindell Spar, PharmD _14  Royetta Asal, PharmD _15  Graylin Shiver, Rph _16  Rema Fendt) Glennon Mac, PharmD _17  Arlyn Dunning, PharmD _18  Netta Cedars, PharmD _19  Dia Sitter, PharmD _20  Leone Haven, PharmD _21  Gretta Arab, PharmD _22  Theodis Shove, PharmD _23  Peggyann Juba, PharmD _24  Reuel Boom, PharmD   Positive urine culture Treated with Cefuroxime Axetil, organism sensitive to the same and no further patient follow-up is required at this time. Dia Sitter, PharmD  Harlon Flor Talley 11/16/2020, 11:52 AM

## 2020-11-20 ENCOUNTER — Other Ambulatory Visit: Payer: Self-pay | Admitting: Endocrinology

## 2020-11-22 ENCOUNTER — Ambulatory Visit: Payer: Medicare Other | Admitting: Endocrinology

## 2020-12-05 ENCOUNTER — Ambulatory Visit: Payer: Self-pay | Admitting: General Surgery

## 2020-12-05 NOTE — H&P (Signed)
vSubjective   Chief Complaint: No chief complaint on file.       History of Present Illness: [name] is a 65 y.o. female who is seen today as an office consultation at the request of Dr. None for evaluation of No chief complaint on file. .   Patient is a 65 year old female who follows up today secondary to a left posterior scalp sebaceous cyst, and previous right posterior ear cyst that was drained in July 2022.   Patient states that she has continued itching in both areas.  She has no further signs of infection or drainage from each area.  She does state that both areas are itching very intensely.           Review of Systems: A complete review of systems was obtained from the patient.  I have reviewed this information and discussed as appropriate with the patient.  See HPI as well for other ROS.   Review of Systems  Constitutional: Negative for fever.  HENT: Negative for congestion.   Eyes: Negative for blurred vision.  Respiratory: Negative for cough, shortness of breath and wheezing.   Cardiovascular: Negative for chest pain and palpitations.  Gastrointestinal: Negative for heartburn.  Genitourinary: Negative for dysuria.  Musculoskeletal: Negative for myalgias.  Skin: Negative for rash.  Neurological: Negative for dizziness and headaches.  Psychiatric/Behavioral: Negative for depression and suicidal ideas.  All other systems reviewed and are negative.       Medical History: Past Medical History Past Medical History: Diagnosis Date  Anxiety    Arthritis    Asthma, unspecified asthma severity, unspecified whether complicated, unspecified whether persistent    GERD (gastroesophageal reflux disease)    Glaucoma (increased eye pressure)    History of cancer    Sleep apnea    Thyroid disease        There is no problem list on file for this patient.     Past Surgical History Past Surgical History: Procedure Laterality Date  breast cancer right side  N/A     right foot broken in 7 places  N/A    thyroid removed surgery  N/A        Allergies Allergies Allergen Reactions  Shellfish Containing Products Anaphylaxis and Swelling     All shellfish    Venom-Honey Bee Anaphylaxis, Other (See Comments) and Hives      Current Outpatient Medications on File Prior to Visit Medication Sig Dispense Refill  albuterol (PROVENTIL) 2.5 mg /3 mL (0.083 %) nebulizer solution albuterol sulfate 2.5 mg/3 mL (0.083 %) solution for nebulization      albuterol 90 mcg/actuation inhaler albuterol sulfate HFA 90 mcg/actuation aerosol inhaler      alcohol swabs PadM Alcohol Pads  USE AS DIRECTED FIVE TIMES DAILY      budesonide-formoteroL (SYMBICORT) 80-4.5 mcg/actuation inhaler Symbicort 80 mcg-4.5 mcg/actuation HFA aerosol inhaler      esomeprazole (NEXIUM) 40 MG DR capsule esomeprazole magnesium 40 mg capsule,delayed release      HUMULIN N NPH INSULIN KWIKPEN pen injector (concentration 100 units/mL) Inject 34 Units into the skin every morning. And pen needles 1/day      hydrOXYzine (ATARAX) 25 MG tablet hydroxyzine HCl 25 mg tablet      insulin DETEMIR (LEVEMIR FLEXTOUCH U-100 INSULN) pen injector (concentration 100 units/mL) Levemir FlexTouch U-100 Insulin 100 unit/mL (3 mL) subcutaneous pen  Inject 10 unit at bedtime      insulin LISPRO (HUMALOG KWIKPEN INSULIN) pen injector (concentration 100 units/mL) Humalog KwikPen (U-100) Insulin  100 unit/mL subcutaneous      levothyroxine (SYNTHROID) 175 MCG tablet levothyroxine 175 mcg tablet      LORazepam (ATIVAN) 0.5 MG tablet lorazepam 0.5 mg tablet      losartan (COZAAR) 100 MG tablet losartan 100 mg tablet      methocarbamoL (ROBAXIN) 500 MG tablet methocarbamol 500 mg tablet      methocarbamoL (ROBAXIN) 750 MG tablet Take 1 Tablet three times daily, as needed for muscle spasms      metoprolol tartrate (LOPRESSOR) 25 MG tablet metoprolol tartrate 25 mg tablet  Take 1 tablet twice a day by oral route.       multivitamin with B complex-vitamin C (FARBEE WITH C) tablet Take 1 tablet by mouth once daily      naloxone (NARCAN) 4 mg/actuation nasal spray instill ONE SPRAY in one nostril, REPEAT EVERY THREE minutes if still unconscious OR minimally conscious. Call 911      naloxone [facility]) 4 mg/actuation nasal spray Narcan 4 mg/actuation nasal spray      ofloxacin (FLOXIN) 0.3 % otic solution ofloxacin 0.3 % ear drops      ONETOUCH VERIO TEST STRIPS test strip 3 (three) times daily      oxyCODONE (DAZIDOX) 10 mg immediate release tablet oxycodone 10 mg tablet      oxyCODONE (ROXICODONE) 15 MG immediate release tablet Take 1 (one) Tablet five times daily, as needed      polyethylene glycol (MIRALAX) powder polyethylene glycol 3350 17 gram/dose oral powder      potassium chloride (K-TAB) 20 mEq TbER ER tablet daily      pregabalin (LYRICA) 300 MG capsule Take 1 Capsule two times daily for neuropathy      ranitidine (ZANTAC) 150 MG tablet ranitidine 150 mg tablet  Take 1 tablet by mouth daily in the evening      sitaGLIPtin-metFORMIN (JANUMET) 50-500 mg tablet Janumet 50 mg-500 mg tablet      sucralfate (CARAFATE) 1 gram tablet 1 (one) Tablet Tablet by mouth four times daily, as needed      sulfamethoxazole-trimethoprim (BACTRIM DS) 800-160 mg tablet Take 1 (one) Tablet by mouth two times daily      tolterodine (DETROL LA) 2 MG LA capsule        triamcinolone 0.1 % ointment triamcinolone acetonide 0.1 % topical ointment      valACYclovir (VALTREX) 1000 MG tablet valacyclovir 1 gram tablet      valACYclovir (VALTREX) 1000 MG tablet Take 1,000 mg by mouth once daily      venlafaxine (EFFEXOR-XR) 150 MG XR capsule venlafaxine ER 150 mg capsule,extended release 24 hr      XTAMPZA ER 27 mg ER capsule Take 1 (one) Capsule by mouth at bedtime      ziprasidone (GEODON) 60 MG capsule ziprasidone 60 mg capsule       No current facility-administered medications on file prior to visit.     Family  History Family History Problem Relation Age of Onset  Stroke Mother    Obesity Mother    High blood pressure (Hypertension) Mother    Diabetes Mother    Breast cancer Mother    Myocardial Infarction (Heart attack) Mother    Stroke Sister    Obesity Sister    High blood pressure (Hypertension) Sister    Deep vein thrombosis (DVT or abnormal blood clot formation) Sister    Breast cancer Sister    High blood pressure (Hypertension) Brother    Myocardial Infarction (Heart attack) Brother  Social History   Tobacco Use Smoking Status Never Smoker Smokeless Tobacco Never Used     Social History Social History    Socioeconomic History  Marital status: Single Tobacco Use  Smoking status: Never Smoker  Smokeless tobacco: Never Used Surveyor, mining Use: Never used Substance and Sexual Activity  Alcohol use: Not Currently  Drug use: Never      Objective:     There were no vitals filed for this visit.  There is no height or weight on file to calculate BMI.   Physical Exam Constitutional:      Appearance: Normal appearance.  HENT:     Head: Normocephalic and atraumatic.     Mouth/Throat:     Mouth: Mucous membranes are moist.     Pharynx: Oropharynx is clear.  Eyes:     General: No scleral icterus.    Pupils: Pupils are equal, round, and reactive to light.  Cardiovascular:     Rate and Rhythm: Normal rate and regular rhythm.     Pulses: Normal pulses.     Heart sounds: No murmur heard.   No friction rub. No gallop.  Pulmonary:     Effort: Pulmonary effort is normal. No respiratory distress.     Breath sounds: Normal breath sounds. No stridor.  Abdominal:     General: Abdomen is flat.  Musculoskeletal:        General: No swelling.  Skin:    General: Skin is warm.     Comments: Left posterior scalp cyst, 1 cm, right posterior ear cyst, elongated, 3 x 1 cm  Neurological:     General: No focal deficit present.     Mental Status: She is alert and  oriented to person, place, and time. Mental status is at baseline.  Psychiatric:        Mood and Affect: Mood normal.        Thought Content: Thought content normal.        Judgment: Judgment normal.          Assessment and Plan: Diagnoses and all orders for this visit:   Sebaceous cyst     [name] is a 65 y.o. female    1.   we will proceed to the operating for local MAC excision of cyst x2. 2. [place] I discussed with the risk benefits the procedure to include but not limited to: Infection, bleeding, damage surrounding structures, possible recurrence.  Patient voiced understanding wishes to proceed.           No follow-ups on file.   Ralene Ok, MD, [facility], [facility]

## 2020-12-12 ENCOUNTER — Ambulatory Visit
Admission: RE | Admit: 2020-12-12 | Discharge: 2020-12-12 | Disposition: A | Discharge status: Home / Self Care | Payer: Medicare Other | Source: Ambulatory Visit | Attending: Physician Assistant | Admitting: Physician Assistant

## 2020-12-12 ENCOUNTER — Other Ambulatory Visit: Payer: Self-pay

## 2020-12-12 ENCOUNTER — Ambulatory Visit: Payer: Medicare Other

## 2020-12-12 DIAGNOSIS — N6310 Unspecified lump in the right breast, unspecified quadrant: Secondary | ICD-10-CM

## 2020-12-14 ENCOUNTER — Other Ambulatory Visit: Payer: Self-pay

## 2020-12-14 ENCOUNTER — Ambulatory Visit: Payer: Medicare Other | Admitting: Dietician

## 2020-12-14 ENCOUNTER — Ambulatory Visit (INDEPENDENT_AMBULATORY_CARE_PROVIDER_SITE_OTHER): Payer: Medicare Other | Admitting: Endocrinology

## 2020-12-14 VITALS — BP 140/90 | HR 103 | Ht 65.0 in

## 2020-12-14 DIAGNOSIS — E118 Type 2 diabetes mellitus with unspecified complications: Secondary | ICD-10-CM

## 2020-12-14 DIAGNOSIS — Z794 Long term (current) use of insulin: Secondary | ICD-10-CM | POA: Diagnosis not present

## 2020-12-14 LAB — POCT GLYCOSYLATED HEMOGLOBIN (HGB A1C): Hemoglobin A1C: 8.3 % — AB (ref 4.0–5.6)

## 2020-12-14 NOTE — Patient Instructions (Addendum)
Please continue the same Trulicity, and insulin.  check your blood sugar twice a day.  vary the time of day when you check, between before the 3 meals, and at bedtime.  also check if you have symptoms of your blood sugar being too high or too low.  please keep a record of the readings and bring it to your next appointment here (or you can bring the meter itself).  You can write it on any piece of paper.  please call us sooner if your blood sugar goes below 70, or if you have a lot of readings over 200.   We are placing a continuous glucose monitor sensor today Please come back for a follow-up appointment in 2 weeks.   Please bring all of your medication bottles when you come back.

## 2020-12-14 NOTE — Progress Notes (Signed)
Placed FreeStyle Libre Pro on patients right arm.   Scanned sensor and confirmed that it was working in 2 minutes. Instructed patient that it can be worn in the shower and to be cautious when changing clothes. Discussed that if the sensor falls off in 2-3 days to call our office to have a new one placed.  Discussed if the sensor comes off prior to her appointment to place it in a ziplock or envelope and bring with her to her MD appointment. Provided log for patient to fill in.  Patient to follow up with MD in 2 weeks.  Antonieta Iba, RD, LDN, CDCES

## 2020-12-14 NOTE — Progress Notes (Signed)
Subjective:    Patient ID: [name], female    DOB: [birth year], 65 y.o.   MRN: [id number]  HPI Pt returns for f/u of postsurgical hypothyroidism (she had thyroidectomy in approx 1988, for a goiter; she says no cancer was found; she has been on prescribed thyroid hormone therapy since then; she has not recently had thyroid imaging; she does not know why TSH fluctuates, but med noncompliance has been noted; she takes synthroid as rx'ed; MRI (2018) showed normal orbits, and no mention was made of the pituitary).  Pt says she believes synthroid dosage is 200 mcg/d, but she is not sure.  Med list in epic has both 112 and 200 mcg dosages.   Pt also returns for f/u of diabetes mellitus:  DM type: Insulin-requiring type 2.   Dx'ed: 2015.  Complications: stage 3 CRI, PN, and TIA.  Therapy: insulin since 0932, and Trulicity.    GDM: never.  DKA: never Severe hypoglycemia: never.   Pancreatitis: never Pancreatic imaging: Fatty atrophy (2018 CT).   SDOH: she lives alone, despite schizophrenia.   Other: she did not tolerate Invokana (sob); She declined to continue multiple daily injections.  Interval history: Meter is downloaded today, and the printout is scanned into the record.  cbg varies from 97-240.  There is no trend throughout the day.    Past Medical History:  Diagnosis Date   Anemia    Anxiety    Arthritis    "pretty much all my joints"   Asthma    Benign paroxysmal positional vertigo 12/30/2012   Breast cancer ([place]) 02/13/12   ruq  100'clock bx Ductal Carcinoma in Situ,(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   Breast mass in female    L breast 2008, US showed likely fat necrosis   Chronic lower back pain    CKD (chronic kidney disease), stage III ([place])    "lower stage" (01/06/2014)   Daily headache    last time was September 20, 2016 (brother passed away)   Depression    Dyspnea    Gait abnormality 05/29/2016   GERD (gastroesophageal reflux disease)    History of blood transfusion 2011   "after one  of my OR's"   History of hiatal hernia    History of stomach ulcers    HSV (herpes simplex virus) infection    Hx of radiation therapy 05/05/12- 07/15/12   right breast, 45 gray x 25 fx, lumpectomy cavity boosted to 16.2 gray   Hyperlipemia    Hypertension    sees Dr. 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   partial   benign cyst  Left    removed from left breast   BREAST BIOPSY Right 02/13/2012   BREAST LUMPECTOMY  03/03/2012   Procedure: LUMPECTOMY;  Surgeon: Haywood Lasso,  MD;  Location: [facility];  Service: General;  Laterality: Right;   BREAST SURGERY Right 01/2012   "cancer"   CHOLECYSTECTOMY  1980's?   COLONOSCOPY WITH PROPOFOL N/A 09/28/2012   Procedure: COLONOSCOPY WITH PROPOFOL;  Surgeon: Lear Ng, MD;  Location: [facility];  Service: Endoscopy;  Laterality: N/A;   FOOT FRACTURE SURGERY Right 1990's   JOINT REPLACEMENT      x 3   LUMBAR LAMINECTOMY/DECOMPRESSION MICRODISCECTOMY N/A 12/05/2016   Procedure: LEFT L5-S1 MICRODISCECTOMY;  Surgeon: Marybelle Killings, MD;  Location: [facility];  Service: Orthopedics;  Laterality: N/A;   REVISION TOTAL KNEE ARTHROPLASTY  07/2009   SHOULDER OPEN ROTATOR CUFF REPAIR Left 1990's   THYROIDECTOMY  1970's   TOTAL KNEE ARTHROPLASTY Bilateral 2009-06/2009   left; right    Social History   Socioeconomic History   Marital status: Divorced    Spouse name: Not on file   Number  of children: 2   Years of education: 12   Highest education level: Not on file  Occupational History   Occupation: Disabled  Tobacco Use   Smoking status: Former    Packs/day: 1.00    Years: 7.00    Pack years: 7.00    Types: Cigarettes    Quit date: 03/24/1992    Years since quitting: 28.7   Smokeless tobacco: Never  Vaping Use   Vaping Use: Never used  Substance and Sexual Activity   Alcohol use: No    Comment: "stopped drinking in 1996; I was an alcoholic"   Drug use: No   Sexual activity: Never    Birth control/protection: Surgical  Other Topics Concern   Not on file  Social History Narrative   Single. Disabled Biochemist, clinical.          Patient is right handed.    Denies caffeine use    Social Determinants of Radio broadcast assistant Strain: Not on file  Food Insecurity: Not on file  Transportation Needs: Not on file  Physical Activity: Not on file  Stress: Not on file  Social Connections: Not on file  Intimate Partner Violence: Not on file    Current Outpatient Medications on File Prior to Visit  Medication Sig Dispense Refill   albuterol (VENTOLIN HFA) 108 (90 Base) MCG/ACT inhaler Inhale 2 puffs into the lungs every 4 (four) hours as needed for wheezing or shortness of breath.      amLODipine (NORVASC) 10 MG tablet Take 1 tablet (10 mg total) by mouth daily. 30 tablet 1   aspirin EC 81 MG tablet Take 1 tablet (81 mg total) by mouth daily. 30 tablet 0   B Complex-C (B-COMPLEX WITH VITAMIN C) tablet Take 1 tablet by mouth daily.     belladonna-opium (B&O SUPPRETTES) 16.2-30 MG suppository Place 1 suppository rectally 2 (two) times daily as needed (for bladder spasms). 10 suppository 0   calcitRIOL (ROCALTROL) 0.25 MCG capsule Take 1 capsule (0.25 mcg total) by mouth 3 (three) times a week. (Patient taking differently: Take 0.25 mcg by mouth 3 (three) times a week. Taken Monday, Wednesday, Friday) 12 capsule 0   Cholecalciferol (VITAMIN D-3) 1000 units CAPS  Take 1,000 Units by mouth daily.     Cyanocobalamin (VITAMIN B-12 PO) Take 1 tablet by mouth daily.     cyclobenzaprine (FLEXERIL) 10 MG tablet Take 10 mg by mouth at bedtime as needed for muscle spasms.     doxepin (SINEQUAN) 50 MG capsule Take 50-100 mg by mouth at bedtime as  needed for sleep.     esomeprazole (NEXIUM) 40 MG capsule Take 1 capsule (40 mg total) by mouth daily before breakfast. (Patient taking differently: Take 40 mg by mouth daily at 12 noon.) 30 capsule 1   famotidine (PEPCID) 40 MG tablet Take 80 mg by mouth daily as needed for indigestion.     furosemide (LASIX) 20 MG tablet Take 2 tablets (40 mg total) by mouth daily. 90 tablet 0   Insulin NPH, Human,, Isophane, (HUMULIN N KWIKPEN) 100 UNIT/ML Kiwkpen Inject 34 Units into the skin every morning. And pen needles 1/day 45 mL 3   Lancets (ONETOUCH DELICA PLUS [id number]) MISC 1 each by Other route 3 (three) times daily. Use to check blood sugar 3 times a day 300 each 11   levothyroxine (SYNTHROID) 200 MCG tablet Take 200 mcg by mouth every morning.     linaclotide (LINZESS) 290 MCG CAPS capsule Take 290 mcg by mouth daily before breakfast.     LORazepam (ATIVAN) 0.5 MG tablet Take 1 tablet (0.5 mg total) by mouth every 8 (eight) hours as needed for anxiety. 6 tablet 0   losartan-hydrochlorothiazide (HYZAAR) 100-25 MG tablet Take 1 tablet by mouth daily.     lubiprostone (AMITIZA) 24 MCG capsule Take 24 mcg by mouth 2 (two) times daily.     methocarbamol (ROBAXIN) 750 MG tablet Take 750 mg by mouth 3 (three) times daily as needed for muscle spasms.     naloxone (NARCAN) nasal spray 4 mg/0.1 mL Place 1 spray into the nose as needed for opioid reversal.     ONETOUCH VERIO test strip TEST BLOOD SUGAR THREE TIMES DAILY 100 strip 11   potassium chloride SA (KLOR-CON) 20 MEQ tablet Take 20 mEq by mouth daily.     pregabalin (LYRICA) 300 MG capsule Take 300 mg by mouth 2 (two) times daily.     rosuvastatin (CRESTOR) 20 MG tablet Take 20  mg by mouth daily.     sucralfate (CARAFATE) 1 g tablet Take 1 g by mouth 4 (four) times daily as needed (for ulcers).     SURE COMFORT PEN NEEDLES 32G X 6 MM MISC USE TO INJECT INSULIN FOUR TIMES DAILY 200 each 1   tolterodine (DETROL LA) 2 MG 24 hr capsule Take 2 mg by mouth daily.     TRULICITY 4.5 JQ/3.0SP SOPN Inject 4.5 mg into the skin once a week. 6 mL 3   valACYclovir (VALTREX) 1000 MG tablet Take 1,000 mg by mouth daily.     venlafaxine XR (EFFEXOR-XR) 150 MG 24 hr capsule Take 150 mg by mouth 2 (two) times daily.     Vitamin D, Ergocalciferol, (DRISDOL) 1.25 MG (50000 UNIT) CAPS capsule Take 50,000 Units by mouth once a week.     XTAMPZA ER 27 MG C12A Take 27 mg by mouth at bedtime.     ziprasidone (GEODON) 60 MG capsule Take 60 mg by mouth daily.     No current facility-administered medications on file prior to visit.    Allergies  Allergen Reactions   Bee Venom Anaphylaxis   Invokana [Canagliflozin] Anaphylaxis    Dyspnea and urinary retention   Shellfish Allergy Anaphylaxis   Buprenorphine Hcl Other (See Comments)    Overly sedated with morphine drip.   Other Other (See Comments)    Cats - bad asthma, stopped up    Family History  Problem Relation Age of Onset   Heart disease Brother        Multiple MIs, starting  in his 65s   Diabetes Brother    Thyroid disease Brother    Hypertension Mother    Diabetes Mother    Breast cancer Mother 20   Bone cancer Mother    Hypertension Sister    Diabetes Sister    Breast cancer Sister 66   Thyroid disease Sister    Breast cancer Maternal Grandmother    Heart disease Maternal Grandmother    Uterine cancer Other 19   Breast cancer Paternal Aunt 59   Breast cancer Paternal Grandmother        dx in her 8s   Prostate cancer Paternal Grandfather    Asthma Son     BP 140/90 (BP Location: Right Arm, Patient Position: Sitting, Cuff Size: Large)   Pulse (!) 103   Ht _0  (1.651 m)   SpO2 93%   BMI 49.92 kg/m     Review of Systems She denies hypoglycemia    Objective:   Physical Exam   Lab Results  Component Value Date   PTH 38 05/08/2020   CALCIUM 9.9 11/10/2020   CAION 1.21 09/30/2020   PHOS 3.7 10/05/2020   Lab Results  Component Value Date   CREATININE 1.13 (H) 11/10/2020   BUN 24 (H) 11/10/2020   NA 132 (L) 11/10/2020   K 3.6 11/10/2020   CL 94 (L) 11/10/2020   CO2 24 11/10/2020   Lab Results  Component Value Date   TSH 8.111 (H) 10/03/2020    A1c=8.3%    Assessment & Plan:  Hypothyroidism.  Uncertain synthroid dosage. Insulin-requiring type 2 DM: uncontrolled. I need CGM info in order to safely adjust insulin  Patient Instructions  Please continue the same Trulicity, and insulin.  check your blood sugar twice a day.  vary the time of day when you check, between before the 3 meals, and at bedtime.  also check if you have symptoms of your blood sugar being too high or too low.  please keep a record of the readings and bring it to your next appointment here (or you can bring the meter itself).  You can write it on any piece of paper.  please call us sooner if your blood sugar goes below 70, or if you have a lot of readings over 200.   We are placing a continuous glucose monitor sensor today Please come back for a follow-up appointment in 2 weeks.   Please bring all of your medication bottles when you come back.

## 2020-12-17 ENCOUNTER — Other Ambulatory Visit: Payer: Self-pay | Admitting: Endocrinology

## 2020-12-17 DIAGNOSIS — N2581 Secondary hyperparathyroidism of renal origin: Secondary | ICD-10-CM

## 2020-12-28 ENCOUNTER — Ambulatory Visit: Payer: Medicare Other | Admitting: Endocrinology

## 2021-01-02 ENCOUNTER — Ambulatory Visit: Payer: Medicare Other | Admitting: Endocrinology

## 2021-01-22 ENCOUNTER — Other Ambulatory Visit: Payer: Self-pay

## 2021-01-22 ENCOUNTER — Encounter (HOSPITAL_COMMUNITY): Payer: Self-pay | Admitting: General Surgery

## 2021-01-22 NOTE — Progress Notes (Signed)
Spoke with pt for pre-op call. Pt has a very extensive medical history. Pt states she does not have a cardiac history. Pt is a type 2 diabetic. Last A1C was 8.3 on 12/14/20. She states her fasting blood sugar is usually around 200. Instructed pt to take 1/2 of her regular dose of Humulin N the morning of surgery. She will take 17 units. Instructed her to check her blood sugar when she wakes up Thursday AM. If blood sugar is 70 or below, treat with 1/2 cup of clear juice (apple or cranberry) and recheck blood sugar 15 minutes after drinking juice. If blood sugar continues to be 70 or below notify your nurse on arrival to Short Stay. Pt voiced understanding.   Pt has Belbuca that she uses twice a day for chronic pain. I asked her what doctor prescribes that for her and she states it is a pain management doctor, but could not think of her name. She states she is not with [facility]. I instructed her (per Haywood Filler, Anesthesia PA) to call that doctor and tell her about the surgery and have her instruct pt on whether to hold it or not. And once she has those instructions, she needs to follow them and then let us know the day of surgery. She voiced understanding.   Pt understands that she needs someone to drive her home after surgery and be with her for 24 hours once she gets home. She states she will let us know the name DOS.   Sent to Anesthesia PA

## 2021-01-23 ENCOUNTER — Encounter (HOSPITAL_COMMUNITY): Payer: Self-pay | Admitting: Vascular Surgery

## 2021-01-23 NOTE — H&P (Signed)
vSubjective   Chief Complaint: No chief complaint on file.       History of Present Illness: [name] is a 65 y.o. female who is seen today as an office consultation at the request of Dr. None for evaluation of No chief complaint on file. .   Patient is a 65 year old female who follows up today secondary to a left posterior scalp sebaceous cyst, and previous right posterior ear cyst that was drained in July 2022.   Patient states that she has continued itching in both areas.  She has no further signs of infection or drainage from each area.  She does state that both areas are itching very intensely.           Review of Systems: A complete review of systems was obtained from the patient.  I have reviewed this information and discussed as appropriate with the patient.  See HPI as well for other ROS.   Review of Systems  Constitutional: Negative for fever.  HENT: Negative for congestion.   Eyes: Negative for blurred vision.  Respiratory: Negative for cough, shortness of breath and wheezing.   Cardiovascular: Negative for chest pain and palpitations.  Gastrointestinal: Negative for heartburn.  Genitourinary: Negative for dysuria.  Musculoskeletal: Negative for myalgias.  Skin: Negative for rash.  Neurological: Negative for dizziness and headaches.  Psychiatric/Behavioral: Negative for depression and suicidal ideas.  All other systems reviewed and are negative.       Medical History: Past Medical History Past Medical History: Diagnosis        Date            Anxiety                         Arthritis                         Asthma, unspecified asthma severity, unspecified whether complicated, unspecified whether persistent                       GERD (gastroesophageal reflux disease)                   Glaucoma (increased eye pressure)               History of cancer                      Sleep apnea                 Thyroid disease                  There is no problem  list on file for this patient.     Past Surgical History Past Surgical History: Procedure       Laterality         Date            breast cancer right side          N/A                   right foot broken in 7 places   N/A                   thyroid removed surgery         N/A              Allergies Allergies Allergen  Reactions            Shellfish Containing Products Anaphylaxis and Swelling                           All shellfish              Venom-Honey Bee     Anaphylaxis, Other (See Comments) and Hives       Current Outpatient Medications on File Prior to Visit Medication       Sig       Dispense         Refill            albuterol (PROVENTIL) 2.5 mg /3 mL (0.083 %) nebulizer solution albuterol sulfate 2.5 mg/3 mL (0.083 %) solution for nebulization                                 albuterol 90 mcg/actuation inhaler     albuterol sulfate HFA 90 mcg/actuation aerosol inhaler                                     alcohol swabs PadM   Alcohol Pads  USE AS DIRECTED FIVE TIMES DAILY                               budesonide-formoteroL (SYMBICORT) 80-4.5 mcg/actuation inhaler          Symbicort 80 mcg-4.5 mcg/actuation HFA aerosol inhaler                                  esomeprazole (NEXIUM) 40 MG DR capsule            esomeprazole magnesium 40 mg capsule,delayed release                                   HUMULIN N NPH INSULIN KWIKPEN pen injector (concentration 100 units/mL)  Inject 34 Units into the skin every morning. And pen needles 1/day                                   hydrOXYzine (ATARAX) 25 MG tablet           hydroxyzine HCl 25 mg tablet                           insulin DETEMIR (LEVEMIR FLEXTOUCH U-100 INSULN) pen injector (concentration 100 units/mL) Levemir FlexTouch U-100 Insulin 100 unit/mL (3 mL) subcutaneous pen  Inject 10 unit at bedtime                                  insulin LISPRO (HUMALOG KWIKPEN INSULIN) pen injector (concentration 100 units/mL)         Humalog KwikPen (U-100) Insulin 100 unit/mL subcutaneous  levothyroxine (SYNTHROID) 175 MCG tablet           levothyroxine 175 mcg tablet                            LORazepam (ATIVAN) 0.5 MG tablet            lorazepam 0.5 mg tablet                                   losartan (COZAAR) 100 MG tablet     losartan 100 mg tablet                                      methocarbamoL (ROBAXIN) 500 MG tablet  methocarbamol 500 mg tablet                                      methocarbamoL (ROBAXIN) 750 MG tablet  Take 1 Tablet three times daily, as needed for muscle spasms                              metoprolol tartrate (LOPRESSOR) 25 MG tablet      metoprolol tartrate 25 mg tablet  Take 1 tablet twice a day by oral route.                                  multivitamin with B complex-vitamin C (FARBEE WITH C) tablet     Take 1 tablet by mouth once daily                             naloxone (NARCAN) 4 mg/actuation nasal spray      instill ONE SPRAY in one nostril, REPEAT EVERY THREE minutes if still unconscious OR minimally conscious. Call 911                            naloxone [facility]) 4 mg/actuation nasal spray      Narcan 4 mg/actuation nasal spray                            ofloxacin (FLOXIN) 0.3 % otic solution          ofloxacin 0.3 % ear drops                                 ONETOUCH VERIO TEST STRIPS test strip           3 (three) times daily                              oxyCODONE (DAZIDOX) 10 mg immediate release tablet   oxycodone 10 mg tablet                            oxyCODONE (ROXICODONE) 15 MG immediate release tablet  Take 1 (one) Tablet five times daily, as needed                               polyethylene glycol (MIRALAX) powder         polyethylene glycol 3350 17 gram/dose oral powder                                    potassium chloride (K-TAB) 20 mEq TbER ER tablet           daily                               pregabalin (LYRICA) 300 MG capsule            Take 1 Capsule two times daily for neuropathy                               ranitidine (ZANTAC) 150 MG tablet   ranitidine 150 mg tablet  Take 1 tablet by mouth daily in the evening                            sitaGLIPtin-metFORMIN (JANUMET) 50-500 mg tablet       Janumet 50 mg-500 mg tablet                                  sucralfate (CARAFATE) 1 gram tablet           1 (one) Tablet Tablet by mouth four times daily, as needed                                     sulfamethoxazole-trimethoprim (BACTRIM DS) 800-160 mg tablet  Take 1 (one) Tablet by mouth two times daily                                  tolterodine (DETROL LA) 2 MG LA capsule                                         triamcinolone 0.1 % ointment triamcinolone acetonide 0.1 % topical ointment                                   valACYclovir (VALTREX) 1000 MG tablet     valacyclovir 1 gram tablet                                 valACYclovir (VALTREX) 1000 MG tablet     Take 1,000 mg by mouth once daily                            venlafaxine (EFFEXOR-XR) 150 MG XR capsule     venlafaxine ER 150 mg capsule,extended release 24  hr                                  XTAMPZA ER 27 mg ER capsule      Take 1 (one) Capsule by mouth at bedtime                             ziprasidone (GEODON) 60 MG capsule        ziprasidone 60 mg capsule                      No current facility-administered medications on file prior to visit.     Family History Family History Problem           Relation           Age of Onset            Stroke  Mother              Obesity            Mother              High blood pressure (Hypertension)   Mother              Diabetes          Mother              Breast cancer  Mother              Myocardial Infarction (Heart attack)   Mother              Stroke  Sister                Obesity            Sister                High blood pressure (Hypertension)   Sister                Deep vein thrombosis (DVT or abnormal blood  clot formation)        Sister                Breast cancer  Sister                High blood pressure (Hypertension)   Brother                         Myocardial Infarction (Heart attack)   Brother                    Social History   Tobacco Use Smoking Status           Never Smoker Smokeless Tobacco   Never Used     Social History Social History     Socioeconomic History            Marital status:  Single Tobacco Use            Smoking status:          Never Smoker            Smokeless tobacco:    Never Used Cytogeneticist Use:    Never used Substance and Sexual Activity  Alcohol use:    Not Currently            Drug use:        Never       Objective:     There were no vitals filed for this visit.  There is no height or weight on file to calculate BMI.   Physical Exam Constitutional:      Appearance: Normal appearance.  HENT:     Head: Normocephalic and atraumatic.     Mouth/Throat:     Mouth: Mucous membranes are moist.     Pharynx: Oropharynx is clear.  Eyes:     General: No scleral icterus.    Pupils: Pupils are equal, round, and reactive to light.  Cardiovascular:     Rate and Rhythm: Normal rate and regular rhythm.     Pulses: Normal pulses.     Heart sounds: No murmur heard.   No friction rub. No gallop.  Pulmonary:     Effort: Pulmonary effort is normal. No respiratory distress.     Breath sounds: Normal breath sounds. No stridor.  Abdominal:     General: Abdomen is flat.  Musculoskeletal:        General: No swelling.  Skin:    General: Skin is warm.     Comments: Left posterior scalp cyst, 1 cm, right posterior ear cyst, elongated, 3 x 1 cm  Neurological:     General: No focal deficit present.     Mental Status: She is alert and oriented to person, place, and time. Mental status is at baseline.  Psychiatric:        Mood and Affect: Mood normal.        Thought Content: Thought content normal.        Judgment:  Judgment normal.          Assessment and Plan: Diagnoses and all orders for this visit:   Sebaceous cyst     [name] is a 65 y.o. female    1.           we will proceed to the operating for local MAC excision of cyst x2. 2.         [place] I discussed with the risk benefits the procedure to include but not limited to: Infection, bleeding, damage surrounding structures, possible recurrence.  Patient voiced understanding wishes to proceed.           No follow-ups on file.   Ralene Ok, MD, [facility], [facility]

## 2021-01-23 NOTE — Anesthesia Preprocedure Evaluation (Deleted)
Anesthesia Evaluation    Airway        Dental   Pulmonary former smoker,           Cardiovascular hypertension,      Neuro/Psych    GI/Hepatic   Endo/Other  diabetes  Renal/GU      Musculoskeletal   Abdominal   Peds  Hematology   Anesthesia Other Findings   Reproductive/Obstetrics                             Anesthesia Physical Anesthesia Plan  ASA:   Anesthesia Plan:    Post-op Pain Management:    Induction:   PONV Risk Score and Plan:   Airway Management Planned:   Additional Equipment:   Intra-op Plan:   Post-operative Plan:   Informed Consent:   Plan Discussed with:   Anesthesia Plan Comments: (PAT note written 01/23/2021 by Myra Gianotti, PA-C. )        Anesthesia Quick Evaluation

## 2021-01-23 NOTE — Progress Notes (Signed)
Anesthesia Chart Review:  Case: [id number] Date/Time: 01/24/21 0715   Procedure: EXCISION SCALP SEBACEOUS CYST x 2 - LOCAL   Anesthesia type: Monitor Anesthesia Care   Pre-op diagnosis: SEBACEOUS CYST x 2   Location: [facility] OR ROOM 02 / [place] [facility]   Surgeons: Ralene Ok, MD       DISCUSSION: Patient is a 65 year old female scheduled for the above procedure.  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   - Admission [facility] 09/29/20-10/06/20 for AMS/acute metabolic encephalopathy in setting of presumed hypercarbic respiratory failure with questionable drug overdose (notes suggest unintentional) and known OSA/obesity hypoventilation syndrome and COPD.  Home health aide found patient unresponsive at home and contacted EMS.  Home medications included multiple pain and psychotropic medications.  Head CT showed no acute abnormality.  She was admitted to the ICU and started on Narcan infusion and mentation improved.  Hyponatremia and hypokalemia supplemented.  Planned discharged to [facility].  - ED visits 11/10/20 and 11/13/20 for urinary retention requiring foley catheter and UTI.  She is a same-day work-up.  She is for labs and anesthesia team evaluation on the day of surgery.  She is on Belbuca 30 mcg film twice daily.   VS:  BP Readings from Last 3 Encounters:  12/14/20 140/90  11/14/20 (!) 144/94  11/11/20 137/74   Pulse Readings from Last 3 Encounters:  12/14/20 (!) 103  11/14/20 82  11/11/20 98     PROVIDERS: Fredrich Romans, PA is PCP  - Renato Shin, MD is endocrinologist. Last visit 12/14/20.  Alona Bene, MD is urologist ([facility]). Last visit 01/07/21 with Erik Obey, FNP for follow-up overactive bladder and urinary retention and urge incontinence.  Foley discontinued in October.  Continue Detrol.  Start Myrbetriq.  Follow-up in 6 weeks with UA and postvoid residual. - She is not routinely followed by cardiology. She was evaluated by Mertie Moores, MD in 2015 during sepsis admission with chest pain, negative troponins. Echo showed normal LV systolic function. Myoview showed some anterior apical thinning which was thought due to breast artifact. As needed cardiology follow-up recommended at 09/02/13 visit. Inpatient consult in May 2017 during admission for CAP/gastroenteritis/SIRS/sepsis. Echo showed normal LVEF. No inpatient ischemic work-up planned at that time.    LABS: For labs on arrival as indicated.  Most recent results include: Lab Results  Component Value Date   WBC 9.6 11/10/2020   HGB 12.0 11/10/2020   HCT 35.8 (L) 11/10/2020   PLT 296 11/10/2020   GLUCOSE 139 (H) 11/10/2020   ALT 15 11/10/2020   AST 11 (L) 11/10/2020   NA 132 (L) 11/10/2020   K 3.6 11/10/2020   CL 94 (L) 11/10/2020   CREATININE 1.13 (H) 11/10/2020   BUN 24 (H) 11/10/2020   CO2 24 11/10/2020   TSH 8.111 (H) 10/03/2020   INR 1.1 09/30/2020   HGBA1C 8.3 (A) 12/14/2020    Normal spirometry 10/28/16.   IMAGES: 1V PCXR 09/29/20: IMPRESSION: - Cardiomegaly and probable mild edema. - Patchy opacity in the left greater than right bases could represent atelectasis or infiltrate. Recommend attention on follow-up. - The new left central line is in good position terminating in the SVC with  no pneumothorax.  CT chest 09/29/20: IMPRESSION: Changes of mild pulmonary edema. Small sliding-type hiatal hernia. No other significant abnormality is seen. Aortic Atherosclerosis (ICD10-I70.0).    EKG: 09/29/20 (in setting  of OD in setting of polypharmacy): Sinus rhythm Borderline left axis deviation Low voltage, precordial leads Abnormal R-wave progression, early transition Prolonged QT interval [QT 414 ms, QTcB 524 ms] Confirmed by Octaviano Glow [phone]) on 09/29/2020 3:10:04 PM   CV: Echo 03/30/16 Study Conclusions  - Left ventricle: The cavity size was normal. There was mild focal    basal hypertrophy of the septum. Systolic function was vigorous.    The estimated ejection fraction was in the range of 65% to 70%.    Wall motion was normal; there were no regional wall motion    abnormalities. Doppler parameters are consistent with abnormal    left ventricular relaxation (grade 1 diastolic dysfunction).  - Aortic valve: Transvalvular velocity was minimally increased.    There was no stenosis.  - Left atrium: The atrium was mildly dilated.  - Pulmonary arteries: Systolic pressure was moderately increased.    PA peak pressure: 49 mm Hg (S).    Lexiscan Myoview 08/11/13 IMPRESSION:  Lexiscan myoview: Electrically negative for ischemia. Myoview scan  with mild thinning in the mid/distal anterior/anterolateral wall May  reflect mild ischemia, cannot exclude shifting soft tissue (breast).  LVEF on gating LVEF is calculated at greater than 70%.    Past Medical History:  Diagnosis Date   Anemia    Anxiety    Arthritis    "pretty much all my joints"   Asthma    Benign paroxysmal positional vertigo 12/30/2012   Breast cancer ([place]) 02/13/12   ruq  100'clock bx Ductal Carcinoma in Situ,(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   Breast mass in female    L breast 2008, US showed likely fat necrosis   Chronic lower back pain    CKD (chronic kidney disease), stage III ([place])    "lower stage" (01/06/2014)   Daily headache    last time was 2016/09/18 (brother passed away)   Depression    Dyspnea    Gait abnormality 05/29/2016   GERD (gastroesophageal reflux disease)    History of blood transfusion 2011   "after one of my  OR's"   History of hiatal hernia    History of stomach ulcers    HSV (herpes simplex virus) infection    Hx of radiation therapy 05/05/12- 07/15/12   right breast, 45 gray x 25 fx, lumpectomy cavity boosted to 16.2 gray   Hyperlipemia    Hypertension    sees Dr. Criss Rosales , [facility]   Hypothyroidism    Kidney stones    Knee pain, bilateral    Memory difficulty 12/22/2017   Migraines    "~ 3 times/month" (01/06/2014)   Obesity    OSA on CPAP    pt does not know settings   Pancreatitis    hx of   Personal history of radiation therapy 02/2012   Pneumonia 1950's   Polyneuropathy in diabetes(357.2)    Schizophrenia ([place])    Secondary parkinsonism ([place]) 12/30/2012   Tachycardia    with sx in 2008   Thyroid disease    Type II diabetes mellitus ([place])    dx 2014    Past Surgical History:  Procedure Laterality Date   ABDOMINAL HYSTERECTOMY  1979?   partial   benign cyst  Left    removed from left breast   BREAST BIOPSY Right 02/13/2012  BREAST LUMPECTOMY  03/03/2012   Procedure: LUMPECTOMY;  Surgeon: Haywood Lasso, MD;  Location: [facility];  Service: General;  Laterality: Right;   BREAST SURGERY Right 01/2012   "cancer"   CHOLECYSTECTOMY  1980's?   COLONOSCOPY WITH PROPOFOL N/A 09/28/2012   Procedure: COLONOSCOPY WITH PROPOFOL;  Surgeon: Lear Ng, MD;  Location: [facility];  Service: Endoscopy;  Laterality: N/A;   FOOT FRACTURE SURGERY Right 1990's   JOINT REPLACEMENT      x 3   LUMBAR LAMINECTOMY/DECOMPRESSION MICRODISCECTOMY N/A 12/05/2016   Procedure: LEFT L5-S1 MICRODISCECTOMY;  Surgeon: Marybelle Killings, MD;  Location: [facility];  Service: Orthopedics;  Laterality: N/A;   REVISION TOTAL KNEE ARTHROPLASTY  07/2009   SHOULDER OPEN ROTATOR CUFF REPAIR Left 1990's   THYROIDECTOMY  1970's   TOTAL KNEE ARTHROPLASTY Bilateral 2009-06/2009   left; right    MEDICATIONS: No current facility-administered medications for this encounter.    acetaminophen (TYLENOL) 650 MG  CR tablet   albuterol (VENTOLIN HFA) 108 (90 Base) MCG/ACT inhaler   Ascorbic Acid (VITAMIN C PO)   aspirin EC 81 MG tablet   BELBUCA 300 MCG FILM   calcitRIOL (ROCALTROL) 0.25 MCG capsule   Cholecalciferol (VITAMIN D-3) 1000 units CAPS   Cyanocobalamin (VITAMIN B-12 PO)   cyclobenzaprine (FLEXERIL) 10 MG tablet   doxepin (SINEQUAN) 50 MG capsule   esomeprazole (NEXIUM) 40 MG capsule   famotidine (PEPCID) 40 MG tablet   ferrous sulfate 325 (65 FE) MG tablet   furosemide (LASIX) 20 MG tablet   Insulin NPH, Human,, Isophane, (HUMULIN N KWIKPEN) 100 UNIT/ML Kiwkpen   levothyroxine (SYNTHROID) 200 MCG tablet   linaclotide (LINZESS) 290 MCG CAPS capsule   LORazepam (ATIVAN) 1 MG tablet   losartan-hydrochlorothiazide (HYZAAR) 100-25 MG tablet   lubiprostone (AMITIZA) 24 MCG capsule   methocarbamol (ROBAXIN) 750 MG tablet   mirabegron ER (MYRBETRIQ) 25 MG TB24 tablet   Multiple Vitamin (MULTIVITAMIN WITH MINERALS) TABS tablet   naloxone (NARCAN) nasal spray 4 mg/0.1 mL   penicillin v potassium (VEETID) 500 MG tablet   pregabalin (LYRICA) 300 MG capsule   rosuvastatin (CRESTOR) 20 MG tablet   sucralfate (CARAFATE) 1 g tablet   tolterodine (DETROL LA) 2 MG 24 hr capsule   TRULICITY 4.5 SX/1.1BZ SOPN   valACYclovir (VALTREX) 1000 MG tablet   venlafaxine XR (EFFEXOR-XR) 150 MG 24 hr capsule   Vitamin D, Ergocalciferol, (DRISDOL) 1.25 MG (50000 UNIT) CAPS capsule   ziprasidone (GEODON) 60 MG capsule   amLODipine (NORVASC) 10 MG tablet   belladonna-opium (B&O SUPPRETTES) 16.2-30 MG suppository   Lancets (ONETOUCH DELICA PLUS [id number]) MISC   LORazepam (ATIVAN) 0.5 MG tablet   ONETOUCH VERIO test strip   potassium chloride SA (KLOR-CON) 20 MEQ tablet   SURE COMFORT PEN NEEDLES 32G X 6 MM MISC  Per current medication list she is not taking amlodipine 10 mg or belladonna-opium suppository.   Myra Gianotti, PA-C Surgical Short Stay/Anesthesiology [facility] Phone [phone] [facility] Phone  [phone] 01/23/2021 10:22 AM

## 2021-01-24 ENCOUNTER — Other Ambulatory Visit: Payer: Self-pay | Admitting: Endocrinology

## 2021-01-24 ENCOUNTER — Ambulatory Visit (HOSPITAL_COMMUNITY): Admission: RE | Admit: 2021-01-24 | Payer: Medicare Other | Source: Ambulatory Visit | Admitting: General Surgery

## 2021-01-24 SURGERY — EXCISION, LESION, SCALP
Anesthesia: Monitor Anesthesia Care

## 2021-01-24 NOTE — Progress Notes (Signed)
Patient states that she is not coming for surgery today due to transportation falling through.  OR desk aware and Dr. Rosendo Gros paged.

## 2021-01-28 ENCOUNTER — Ambulatory Visit: Payer: Medicare Other | Admitting: Endocrinology

## 2021-02-26 ENCOUNTER — Ambulatory Visit (INDEPENDENT_AMBULATORY_CARE_PROVIDER_SITE_OTHER): Payer: Medicare Other | Admitting: Endocrinology

## 2021-02-26 ENCOUNTER — Other Ambulatory Visit: Payer: Self-pay

## 2021-02-26 VITALS — BP 110/52 | HR 108 | Ht 65.0 in

## 2021-02-26 DIAGNOSIS — E118 Type 2 diabetes mellitus with unspecified complications: Secondary | ICD-10-CM | POA: Diagnosis not present

## 2021-02-26 DIAGNOSIS — Z794 Long term (current) use of insulin: Secondary | ICD-10-CM

## 2021-02-26 LAB — BASIC METABOLIC PANEL
BUN: 33 mg/dL — ABNORMAL HIGH (ref 6–23)
CO2: 25 mEq/L (ref 19–32)
Calcium: 9.9 mg/dL (ref 8.4–10.5)
Chloride: 93 mEq/L — ABNORMAL LOW (ref 96–112)
Creatinine, Ser: 1.47 mg/dL — ABNORMAL HIGH (ref 0.40–1.20)
GFR: 37.42 mL/min — ABNORMAL LOW (ref >60.00)
Glucose, Bld: 182 mg/dL — ABNORMAL HIGH (ref 70–99)
Potassium: 3.6 mEq/L (ref 3.5–5.1)
Sodium: 131 mEq/L — ABNORMAL LOW (ref 135–145)

## 2021-02-26 LAB — POCT GLYCOSYLATED HEMOGLOBIN (HGB A1C): Hemoglobin A1C: 10.4 % — AB (ref 4.0–5.6)

## 2021-02-26 LAB — TSH: TSH: 5.13 u[IU]/mL (ref 0.35–5.50)

## 2021-02-26 LAB — T4, FREE: Free T4: 0.93 ng/dL (ref 0.60–1.60)

## 2021-02-26 MED ORDER — HUMULIN N KWIKPEN 100 UNIT/ML ~~LOC~~ SUPN: 45.0000 [IU] | PEN_INJECTOR | SUBCUTANEOUS | 3 refills | Status: DC

## 2021-02-26 NOTE — Progress Notes (Signed)
Subjective:    Patient ID: [name], female    DOB: May 08, 1955, 66 y.o.   MRN: [id number]  HPI Pt returns for f/u of postsurgical hypothyroidism (she had thyroidectomy in approx 1988, for a goiter; she says no cancer was found; she has been on prescribed thyroid hormone therapy since then; she has not recently had thyroid imaging; she does not know why TSH fluctuates, but med noncompliance has been noted; she takes synthroid as rx'ed; MRI (2018) showed normal orbits, and no mention was made of the pituitary).  Pt says synthroid dosage is 200 mcg/d. Pt also returns for f/u of diabetes mellitus:  DM type: Insulin-requiring type 2.   Dx'ed: 2015.  Complications: stage 3 CRI, PN, and TIA.  Therapy: insulin since 9381, and Trulicity.    GDM: never.  DKA: never Severe hypoglycemia: never.   Pancreatitis: never Pancreatic imaging: Fatty atrophy (2018 CT).   SDOH: she lives alone, despite schizophrenia.   Other: she did not tolerate Invokana (sob); She declined to continue multiple daily injections; she takes NPH, due to pattern of cbg's.  Interval history: I reviewed continuous glucose monitor data.  glucose varies from 130-400.  there is little trend throughout the day.  It is in general highest at 1PM and 9PM.  It is lowest at 12MN-1AM. Past Medical History:  Diagnosis Date   Anemia    Anxiety    Arthritis    "pretty much all my joints"   Asthma    Benign paroxysmal positional vertigo 12/30/2012   Breast cancer ([place]) 02/13/12   ruq  100'clock bx Ductal Carcinoma in Situ,(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   Breast mass in female    L breast 2008, US showed likely fat necrosis   Chronic lower back pain    CKD (chronic kidney disease), stage III ([place])    "lower stage" (01/06/2014)   Daily headache    last time was September 14, 2016 (brother passed away)   Depression    Dyspnea    Gait abnormality 05/29/2016   GERD (gastroesophageal reflux disease)    History of blood transfusion 2011   "after one  of my OR's"   History of hiatal hernia    History of stomach ulcers    HSV (herpes simplex virus) infection    Hx of radiation therapy 05/05/12- 07/15/12   right breast, 45 gray x 25 fx, lumpectomy cavity boosted to 16.2 gray   Hyperlipemia    Hypertension    sees Dr. Criss Rosales , [facility]   Hypothyroidism    Kidney stones    Knee pain, bilateral    Memory difficulty 12/22/2017   Migraines    "~ 3 times/month" (01/06/2014)   Obesity    OSA on CPAP    pt does not know settings   Pancreatitis    hx of   Personal history of radiation therapy 02/2012   Pneumonia 1950's   Polyneuropathy in diabetes(357.2)    Schizophrenia ([place])    Secondary parkinsonism ([place]) 12/30/2012   Tachycardia    with sx in 2008   Thyroid disease    Type II diabetes mellitus ([place])    dx 2014    Past Surgical History:  Procedure Laterality Date   ABDOMINAL HYSTERECTOMY  1979?   partial   benign cyst  Left    removed from left breast   BREAST BIOPSY Right 02/13/2012   BREAST LUMPECTOMY  03/03/2012   Procedure: LUMPECTOMY;  Surgeon: Haywood Lasso, MD;  Location: [facility];  Service: General;  Laterality: Right;   BREAST SURGERY Right 01/2012   "cancer"   CHOLECYSTECTOMY  1980's?   COLONOSCOPY WITH PROPOFOL N/A 09/28/2012   Procedure: COLONOSCOPY WITH PROPOFOL;  Surgeon: Lear Ng, MD;  [facility];  Service: Endoscopy;  Laterality: N/A;   FOOT FRACTURE SURGERY Right 1990's   JOINT REPLACEMENT      x 3   LUMBAR LAMINECTOMY/DECOMPRESSION MICRODISCECTOMY N/A 12/05/2016   Procedure: LEFT L5-S1 MICRODISCECTOMY;  Surgeon: Marybelle Killings, MD;  Location: [facility];  Service: Orthopedics;  Laterality: N/A;   REVISION TOTAL KNEE ARTHROPLASTY  07/2009   SHOULDER OPEN ROTATOR CUFF REPAIR Left 1990's   THYROIDECTOMY  1970's   TOTAL KNEE ARTHROPLASTY Bilateral 2009-06/2009   left; right    Social History   Socioeconomic History   Marital status: Single    Spouse name: Not on file   Number of  children: 2   Years of education: 12   Highest education level: Not on file  Occupational History   Occupation: Disabled  Tobacco Use   Smoking status: Former    Packs/day: 1.00    Years: 7.00    Pack years: 7.00    Types: Cigarettes    Quit date: 03/24/1992    Years since quitting: 28.9   Smokeless tobacco: Never  Vaping Use   Vaping Use: Never used  Substance and Sexual Activity   Alcohol use: No    Comment: "stopped drinking in 1996; I was an alcoholic"   Drug use: No   Sexual activity: Never    Birth control/protection: Surgical  Other Topics Concern   Not on file  Social History Narrative   Single. Disabled Biochemist, clinical.          Patient is right handed.    Denies caffeine use    Social Determinants of Radio broadcast assistant Strain: Not on file  Food Insecurity: Not on file  Transportation Needs: Not on file  Physical Activity: Not on file  Stress: Not on file  Social Connections: Not on file  Intimate Partner Violence: Not on file    Current Outpatient Medications on File Prior to Visit  Medication Sig Dispense Refill   acetaminophen (TYLENOL) 650 MG CR tablet Take 1,300 mg by mouth every 8 (eight) hours as needed for pain.     albuterol (VENTOLIN HFA) 108 (90 Base) MCG/ACT inhaler Inhale 2 puffs into the lungs every 4 (four) hours as needed for wheezing or shortness of breath.      amLODipine (NORVASC) 10 MG tablet Take 1 tablet (10 mg total) by mouth daily. 30 tablet 1   Ascorbic Acid (VITAMIN C PO) Take 1 tablet by mouth in the morning.     aspirin EC 81 MG tablet Take 1 tablet (81 mg total) by mouth daily. 30 tablet 0   BELBUCA 300 MCG FILM Take 1 strip by mouth 2 (two) times daily.     belladonna-opium (B&O SUPPRETTES) 16.2-30 MG suppository Place 1 suppository rectally 2 (two) times daily as needed (for bladder spasms). 10 suppository 0   calcitRIOL (ROCALTROL) 0.25 MCG capsule Take 1 capsule (0.25 mcg total) by mouth 3 (three) times a week. Taken  Monday, Wednesday, Friday 12 capsule 3   Cholecalciferol (VITAMIN D-3) 1000 units CAPS Take 1,000 Units by mouth daily.     Cyanocobalamin (VITAMIN B-12 PO) Take 1 tablet by mouth daily.     cyclobenzaprine (FLEXERIL) 10 MG tablet Take 10 mg by mouth at bedtime.  doxepin (SINEQUAN) 50 MG capsule Take 100 mg by mouth at bedtime.     esomeprazole (NEXIUM) 40 MG capsule Take 1 capsule (40 mg total) by mouth daily before breakfast. 30 capsule 1   famotidine (PEPCID) 40 MG tablet Take 40 mg by mouth daily after supper.     ferrous sulfate 325 (65 FE) MG tablet Take 325 mg by mouth in the morning.     furosemide (LASIX) 20 MG tablet Take 2 tablets (40 mg total) by mouth daily. (Patient taking differently: Take 60 mg by mouth in the morning.) 90 tablet 0   Lancets (ONETOUCH DELICA PLUS [id number]) MISC 1 each by Other route 3 (three) times daily. Use to check blood sugar 3 times a day 300 each 11   levothyroxine (SYNTHROID) 200 MCG tablet Take 1 tablet (200 mcg total) by mouth daily before breakfast. 90 tablet 3   linaclotide (LINZESS) 290 MCG CAPS capsule Take 290 mcg by mouth daily before breakfast.     LORazepam (ATIVAN) 0.5 MG tablet Take 1 tablet (0.5 mg total) by mouth every 8 (eight) hours as needed for anxiety. 6 tablet 0   LORazepam (ATIVAN) 1 MG tablet Take 1 mg by mouth in the morning, at noon, and at bedtime.     losartan-hydrochlorothiazide (HYZAAR) 100-25 MG tablet Take 1 tablet by mouth in the morning.     lubiprostone (AMITIZA) 24 MCG capsule Take 24 mcg by mouth 2 (two) times daily.     methocarbamol (ROBAXIN) 750 MG tablet Take 750 mg by mouth 3 (three) times daily.     mirabegron ER (MYRBETRIQ) 25 MG TB24 tablet Take 25 mg by mouth in the morning.     Multiple Vitamin (MULTIVITAMIN WITH MINERALS) TABS tablet Take 1 tablet by mouth in the morning.     naloxone (NARCAN) nasal spray 4 mg/0.1 mL Place 1 spray into the nose as needed for opioid reversal.     ONETOUCH VERIO test strip  TEST BLOOD SUGAR THREE TIMES DAILY 100 strip 11   penicillin v potassium (VEETID) 500 MG tablet Take 500 mg by mouth 4 (four) times daily.     potassium chloride SA (KLOR-CON) 20 MEQ tablet Take 20 mEq by mouth daily.     pregabalin (LYRICA) 300 MG capsule Take 300 mg by mouth 2 (two) times daily.     rosuvastatin (CRESTOR) 20 MG tablet Take 20 mg by mouth daily.     sucralfate (CARAFATE) 1 g tablet Take 1 g by mouth in the morning.     SURE COMFORT PEN NEEDLES 32G X 6 MM MISC USE TO INJECT INSULIN FOUR TIMES DAILY 200 each 1   tolterodine (DETROL LA) 2 MG 24 hr capsule Take 2 mg by mouth every evening.     TRULICITY 4.5 RX/4.5OP SOPN Inject 4.5 mg into the skin once a week. (Patient taking differently: Inject 4.5 mg into the skin every Saturday.) 6 mL 3   valACYclovir (VALTREX) 1000 MG tablet Take 1,000 mg by mouth daily.     venlafaxine XR (EFFEXOR-XR) 150 MG 24 hr capsule Take 150 mg by mouth 2 (two) times daily.     Vitamin D, Ergocalciferol, (DRISDOL) 1.25 MG (50000 UNIT) CAPS capsule Take 50,000 Units by mouth every Saturday.     ziprasidone (GEODON) 60 MG capsule Take 60 mg by mouth daily after supper.     No current facility-administered medications on file prior to visit.    Allergies  Allergen Reactions   Bee Venom Anaphylaxis  Invokana [Canagliflozin] Anaphylaxis    Dyspnea and urinary retention   Shellfish Allergy Anaphylaxis   Buprenorphine Hcl Other (See Comments)    Overly sedated with morphine drip.   Other Other (See Comments)    Cats - bad asthma, stopped up    Family History  Problem Relation Age of Onset   Heart disease Brother        Multiple MIs, starting in his 45s   Diabetes Brother    Thyroid disease Brother    Hypertension Mother    Diabetes Mother    Breast cancer Mother 28   Bone cancer Mother    Hypertension Sister    Diabetes Sister    Breast cancer Sister 35   Thyroid disease Sister    Breast cancer Maternal Grandmother    Heart disease  Maternal Grandmother    Uterine cancer Other 19   Breast cancer Paternal Aunt 46   Breast cancer Paternal Grandmother        dx in her 73s   Prostate cancer Paternal Grandfather    Asthma Son     BP (!) 110/52    Pulse (!) 108    Ht _0  (1.651 m)    SpO2 93%    BMI 49.92 kg/m    Review of Systems She denies hypoglycemia.      Objective:   Physical Exam    Lab Results  Component Value Date   HGBA1C 10.4 (A) 02/26/2021      Assessment & Plan:  Insulin-requiring type 2 DM:  Hypothyroidism.  Recheck today.   Patient Instructions  Please continue the same Trulicity, and: I have sent a prescription to your pharmacy, to increase the insulin.  Blood tests are requested for you today.  We'll let you know about the results.  check your blood sugar twice a day.  vary the time of day when you check, between before the 3 meals, and at bedtime.  also check if you have symptoms of your blood sugar being too high or too low.  please keep a record of the readings and bring it to your next appointment here (or you can bring the meter itself).  You can write it on any piece of paper.  please call us sooner if your blood sugar goes below 70, or if you have a lot of readings over 200.   Please come back for a follow-up appointment in 1 month.

## 2021-02-26 NOTE — Patient Instructions (Addendum)
Please continue the same Trulicity, and: I have sent a prescription to your pharmacy, to increase the insulin.  Blood tests are requested for you today.  We'll let you know about the results.  check your blood sugar twice a day.  vary the time of day when you check, between before the 3 meals, and at bedtime.  also check if you have symptoms of your blood sugar being too high or too low.  please keep a record of the readings and bring it to your next appointment here (or you can bring the meter itself).  You can write it on any piece of paper.  please call us sooner if your blood sugar goes below 70, or if you have a lot of readings over 200.   Please come back for a follow-up appointment in 1 month.

## 2021-03-02 ENCOUNTER — Other Ambulatory Visit: Payer: Self-pay | Admitting: Endocrinology

## 2021-03-02 DIAGNOSIS — N2581 Secondary hyperparathyroidism of renal origin: Secondary | ICD-10-CM

## 2021-03-02 IMAGING — US US BREAST*R* LIMITED INC AXILLA
1 series · 2 of 2 positions shown · non-contrast
Comparison: Previous exam(s).

CLINICAL DATA: 64-year-old who underwent malignant lumpectomy of
the UPPER OUTER RIGHT breast in 5195, presenting with a possible
palpable lump the UPPER INNER RIGHT breast. Annual evaluation, LEFT
breast.

EXAM:
DIGITAL DIAGNOSTIC BILATERAL MAMMOGRAM WITH CAD AND TOMO
ULTRASOUND RIGHT BREAST

[Series 1: us breast*right* limited inc axilla · 0.07mm/px · 2 of 2 slices shown]
[im 1/2]
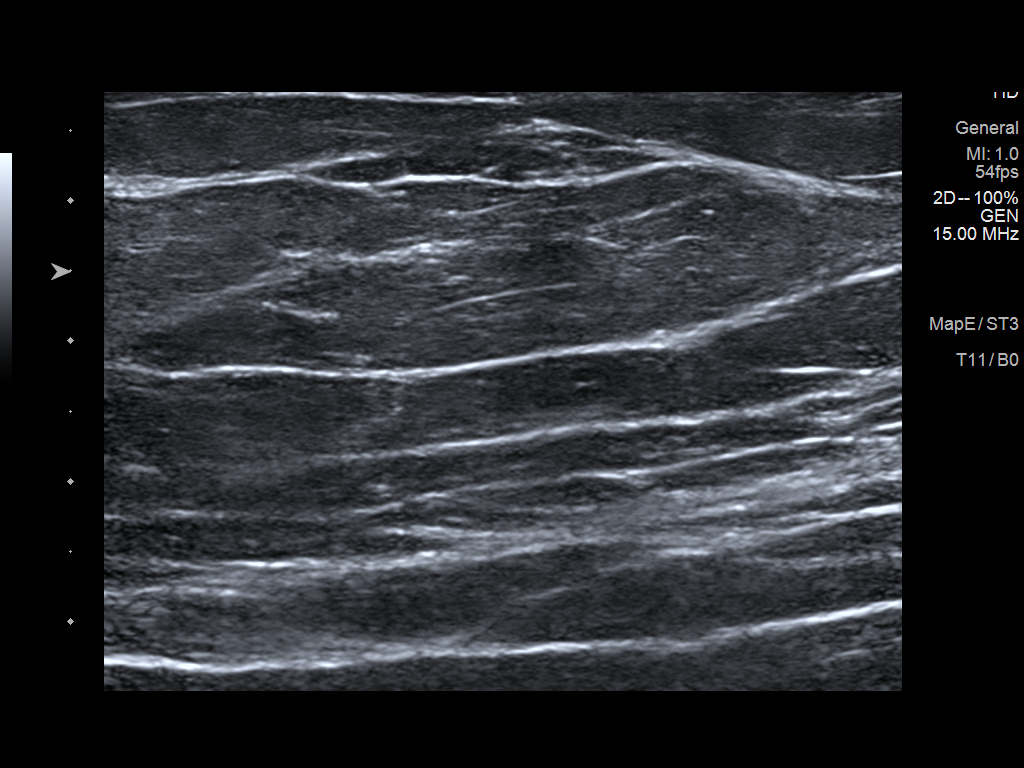
[im 2/2]
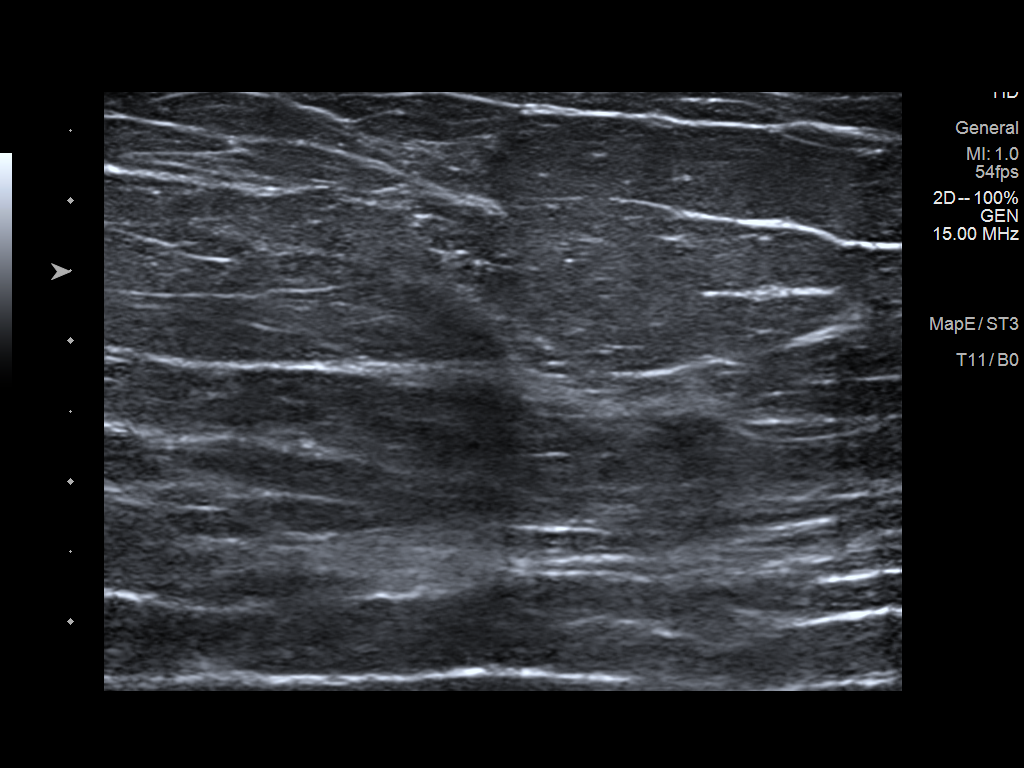

[2 of 2 positions shown; findings below may reference images not displayed]

ACR Breast Density Category b: There are scattered areas of
fibroglandular density.
FINDINGS: Tomosynthesis and synthesized full field CC and MLO views of both
breasts were obtained. Tomosynthesis and synthesized spot
compression tangential view of the area of concern in the RIGHT
breast was also obtained.

Post surgical scar/architectural distortion at the lumpectomy site
in the UPPER OUTER RIGHT breast at POSTERIOR depth, associated with
developing benign dystrophic calcifications. No mammographic
abnormality in the UPPER INNER QUADRANT in the area of palpable
concern. No findings suspicious for malignancy in the RIGHT breast.

No findings suspicious for malignancy in the LEFT breast.

Mammographic images were processed with CAD.

On correlative physical exam, there is no palpable abnormality in
the UPPER INNER RIGHT breast in the area of patient concern. The
tissues in the UPPER OUTER QUADRANT have a "lumpy bumpy" texture.

Targeted RIGHT breast ultrasound is performed, showing normal
fibrofatty and scattered fibroglandular tissue at the 1 o'clock
position approximately 8 cm from the nipple in the area of palpable
concern. No cyst, solid mass or abnormal acoustic shadowing is
identified.
IMPRESSION: 1. No mammographic or sonographic evidence of malignancy involving
the RIGHT breast.
2. No mammographic evidence of malignancy involving the LEFT breast.

RECOMMENDATION:
Screening mammogram in one year.(Code:9R-6-KY7)

I have discussed the findings and recommendations with the patient.
If applicable, a reminder letter will be sent to the patient
regarding the next appointment.

BI-RADS CATEGORY  2: Benign.

## 2021-03-02 IMAGING — MG DIGITAL DIAGNOSTIC BILAT W/ TOMO W/ CAD
6 of 10 series · 6 of 30 positions shown · non-contrast
Comparison: Previous exam(s).

CLINICAL DATA: 64-year-old who underwent malignant lumpectomy of
the UPPER OUTER RIGHT breast in 5195, presenting with a possible
palpable lump the UPPER INNER RIGHT breast. Annual evaluation, LEFT
breast.

EXAM:
DIGITAL DIAGNOSTIC BILATERAL MAMMOGRAM WITH CAD AND TOMO
ULTRASOUND RIGHT BREAST

[R TAN synth-2D]
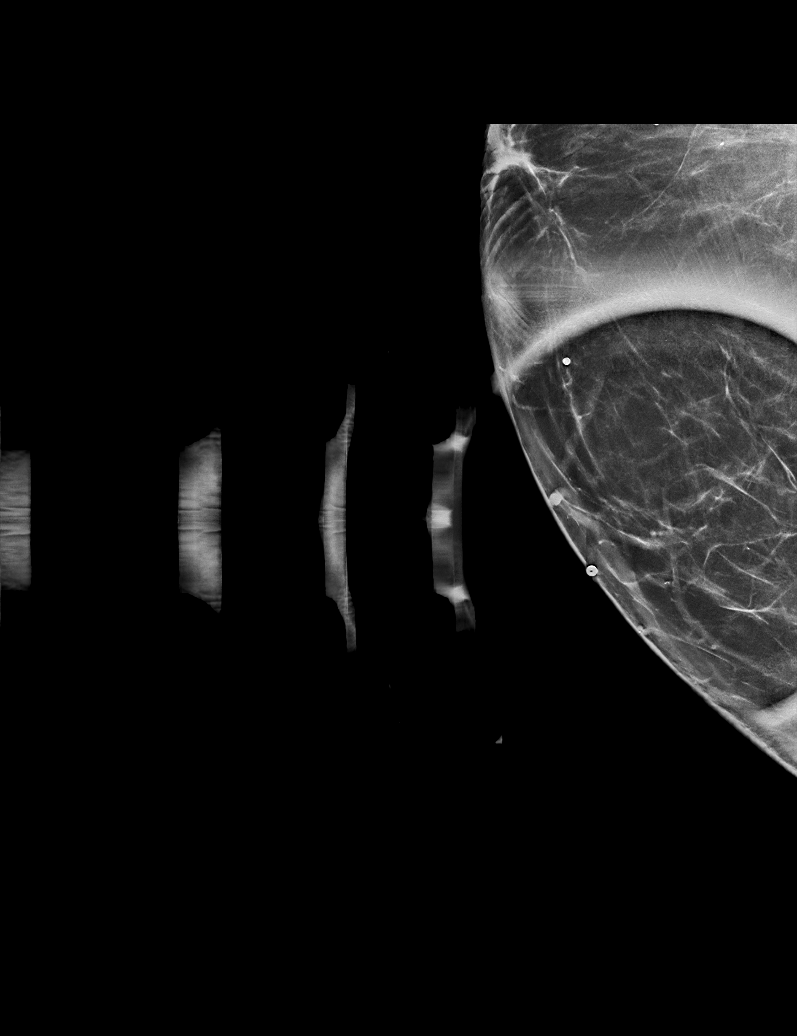

[R CC synth-2D]
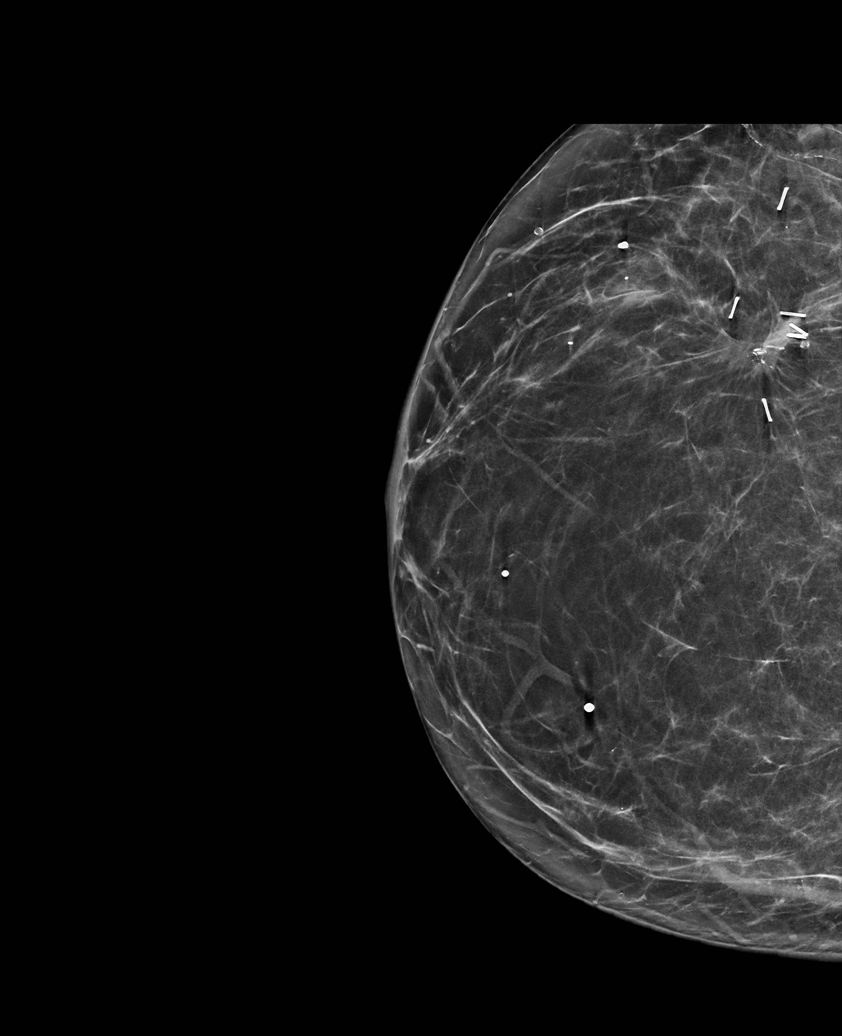

[R MLO synth-2D]
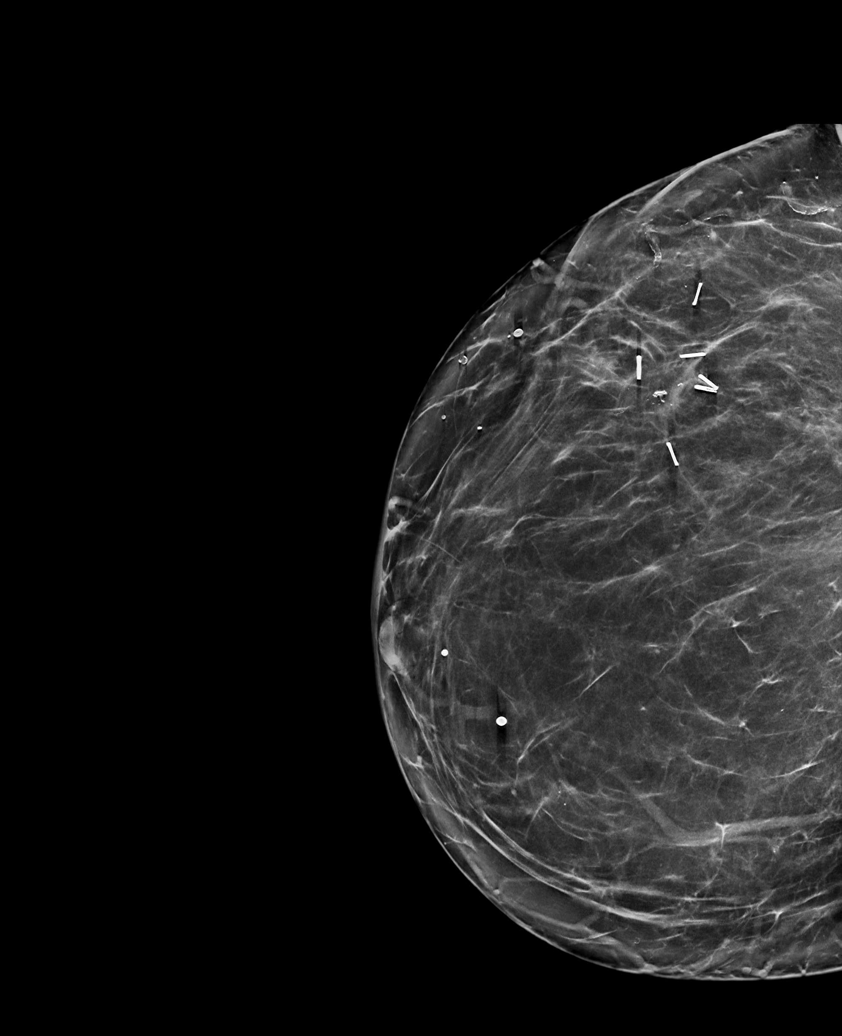

[L MLO synth-2D]
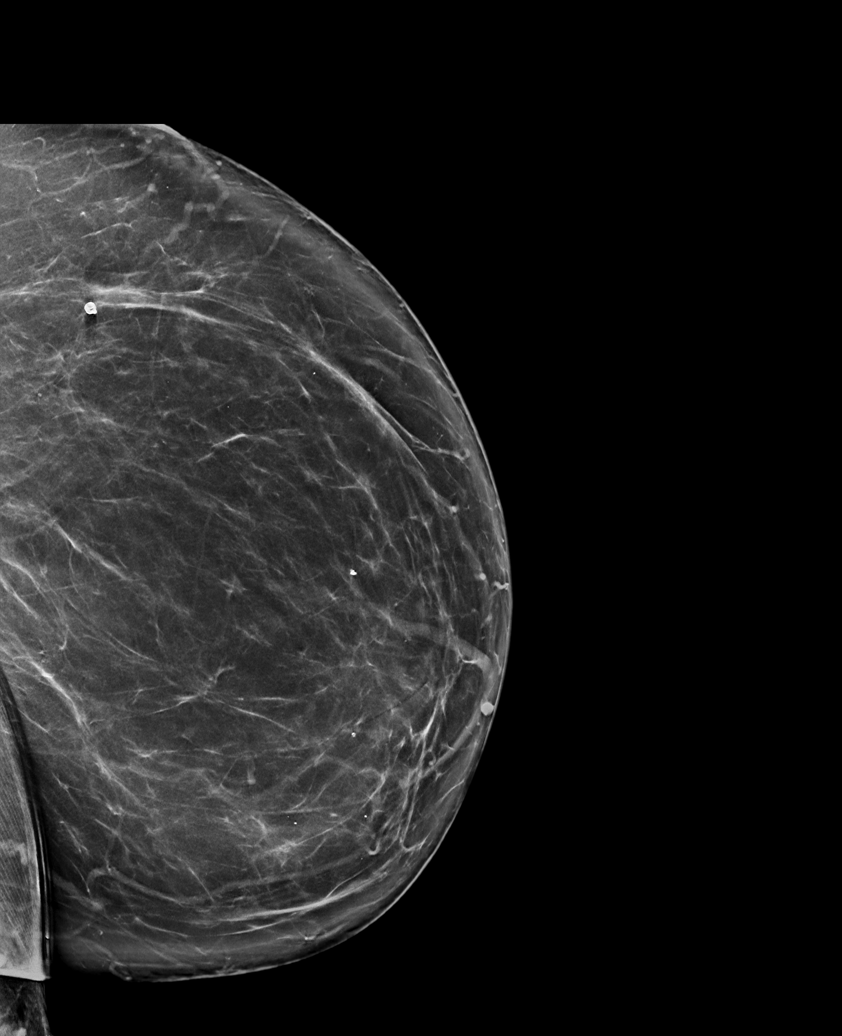

[L CC synth-2D]
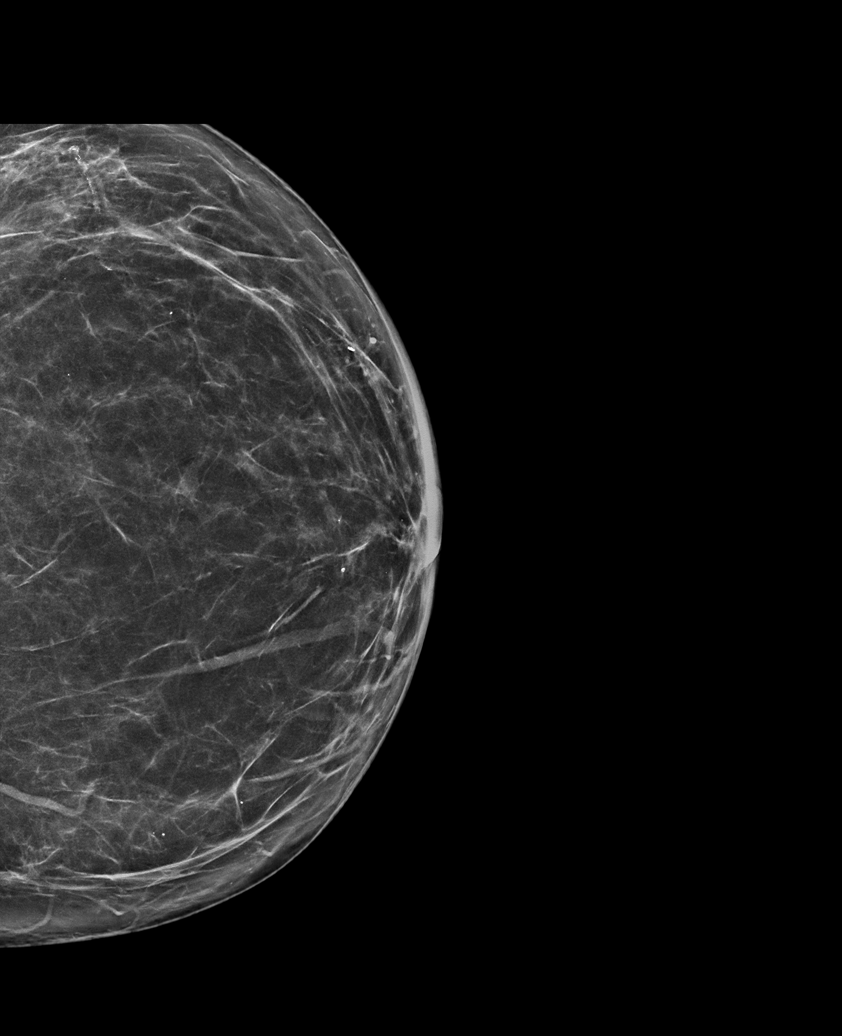

[R MLO tomo · tomo slice 45/89.0]
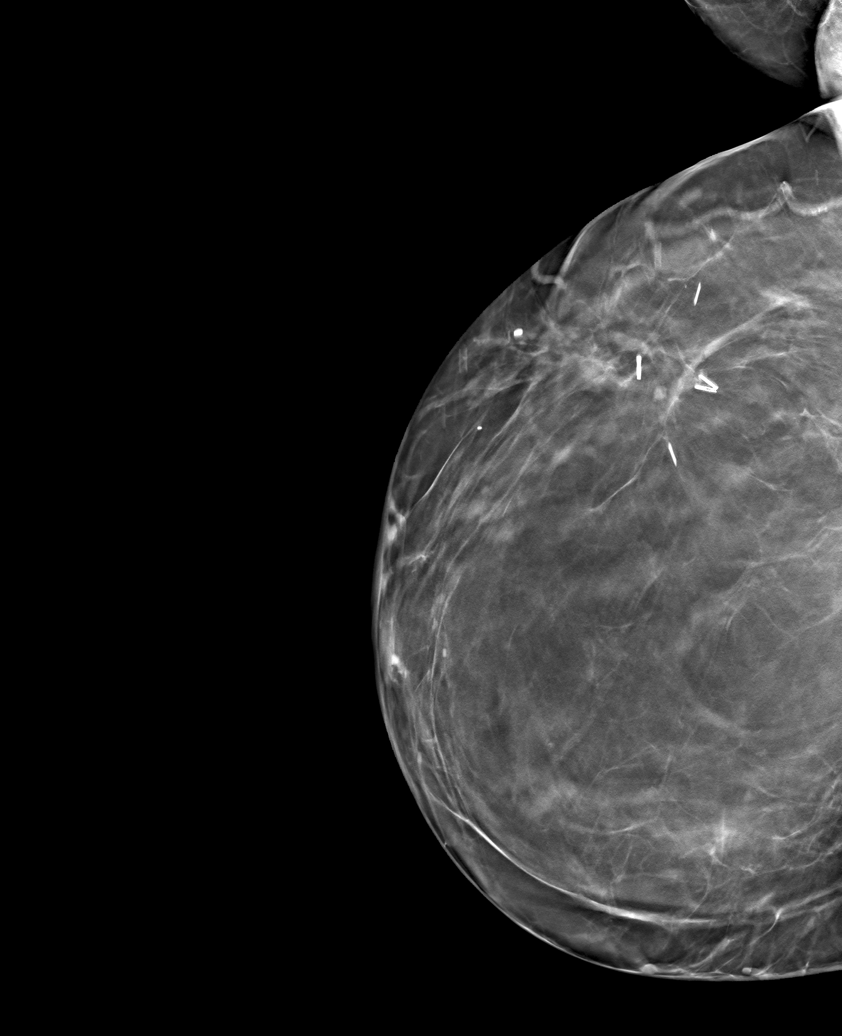

[6 of 30 positions shown; findings below may reference images not displayed]

ACR Breast Density Category b: There are scattered areas of
fibroglandular density.
FINDINGS: Tomosynthesis and synthesized full field CC and MLO views of both
breasts were obtained. Tomosynthesis and synthesized spot
compression tangential view of the area of concern in the RIGHT
breast was also obtained.

Post surgical scar/architectural distortion at the lumpectomy site
in the UPPER OUTER RIGHT breast at POSTERIOR depth, associated with
developing benign dystrophic calcifications. No mammographic
abnormality in the UPPER INNER QUADRANT in the area of palpable
concern. No findings suspicious for malignancy in the RIGHT breast.

No findings suspicious for malignancy in the LEFT breast.

Mammographic images were processed with CAD.

On correlative physical exam, there is no palpable abnormality in
the UPPER INNER RIGHT breast in the area of patient concern. The
tissues in the UPPER OUTER QUADRANT have a "lumpy bumpy" texture.

Targeted RIGHT breast ultrasound is performed, showing normal
fibrofatty and scattered fibroglandular tissue at the 1 o'clock
position approximately 8 cm from the nipple in the area of palpable
concern. No cyst, solid mass or abnormal acoustic shadowing is
identified.
IMPRESSION: 1. No mammographic or sonographic evidence of malignancy involving
the RIGHT breast.
2. No mammographic evidence of malignancy involving the LEFT breast.

RECOMMENDATION:
Screening mammogram in one year.(Code:9R-6-KY7)

I have discussed the findings and recommendations with the patient.
If applicable, a reminder letter will be sent to the patient
regarding the next appointment.

BI-RADS CATEGORY  2: Benign.

## 2021-03-08 ENCOUNTER — Other Ambulatory Visit: Payer: Self-pay | Admitting: Endocrinology

## 2021-03-08 ENCOUNTER — Other Ambulatory Visit (HOSPITAL_COMMUNITY): Payer: Commercial Managed Care - HMO

## 2021-03-08 DIAGNOSIS — E119 Type 2 diabetes mellitus without complications: Secondary | ICD-10-CM

## 2021-03-08 DIAGNOSIS — Z794 Long term (current) use of insulin: Secondary | ICD-10-CM

## 2021-03-11 NOTE — Pre-Procedure Instructions (Addendum)
Surgical Instructions    Your procedure is scheduled on Wednesday, January 25th.  Report to [facility] Main Entrance "A" at 8:30 A.M., then check in with the Admitting office.  Call this number if you have problems the morning of surgery:  [phone]   If you have any questions prior to your surgery date call [phone]: Open Monday-Friday 8am-4pm    Remember:  Do not eat after midnight the night before your surgery  You may drink clear liquids until 7:30 a.m. the morning of your surgery.   Clear liquids allowed are: Water, Non-Citrus Juices (without pulp), Carbonated Beverages, Clear Tea, Black Coffee Only (NO MILK, CREAM OR POWDERED CREAMER of any kind), and Gatorade.  Patient Instructions  The day of surgery (if you have diabetes):  Drink ONE small 12 oz bottle of G2 Gatorade by 7:30 am the morning of surgery This bottle was given to you during your hospital  pre-op appointment visit.  Nothing else to drink after completing the  G2 Gatorade.         If you have questions, please contact your surgeons office.     Take these medicines the morning of surgery with A SIP OF WATER  amLODipine (NORVASC)  esomeprazole (NEXIUM) levothyroxine (SYNTHROID)  linaclotide (LINZESS) LORazepam (ATIVAN) lubiprostone (AMITIZA) methocarbamol (ROBAXIN)  mirabegron ER (MYRBETRIQ)  pregabalin (LYRICA)  rosuvastatin (CRESTOR)  valACYclovir (VALTREX)  venlafaxine XR (EFFEXOR-XR)   As needed: acetaminophen (TYLENOL)  Albuterol inhaler-please bring inhaler with you to the hospital. Eye drops  Follow your surgeon's instructions on when to stop Aspirin.  If no instructions were given by your surgeon then you will need to call the office to get those instructions.    As of today, STOP taking any Aleve, Naproxen, Ibuprofen, Motrin, Advil, Goody's, BC's, all herbal medications, fish oil, and all vitamins.  WHAT DO I DO ABOUT MY DIABETES MEDICATION?   THE MORNING OF SURGERY, take 22  units of Insulin NPH, Human,, Isophane, (HUMULIN N KWIKPEN) insulin.  The day of surgery, do not take other diabetes injectables, Trulicity (dulaglutide).   HOW TO MANAGE YOUR DIABETES BEFORE AND AFTER SURGERY  Why is it important to control my blood sugar before and after surgery? Improving blood sugar levels before and after surgery helps healing and can limit problems. A way of improving blood sugar control is eating a healthy diet by:  Eating less sugar and carbohydrates  Increasing activity/exercise  Talking with your doctor about reaching your blood sugar goals High blood sugars (greater than 180 mg/dL) can raise your risk of infections and slow your recovery, so you will need to focus on controlling your diabetes during the weeks before surgery. Make sure that the doctor who takes care of your diabetes knows about your planned surgery including the date and location.  How do I manage my blood sugar before surgery? Check your blood sugar at least 4 times a day, starting 2 days before surgery, to make sure that the level is not too high or low.  Check your blood sugar the morning of your surgery when you wake up and every 2 hours until you get to the Short Stay unit.  If your blood sugar is less than 70 mg/dL, you will need to treat for low blood sugar: Do not take insulin. Treat a low blood sugar (less than 70 mg/dL) with  cup of clear juice (cranberry or apple), 4 glucose tablets, OR glucose gel. Recheck blood sugar in 15 minutes after treatment (to make sure  it is greater than 70 mg/dL). If your blood sugar is not greater than 70 mg/dL on recheck, call [phone] for further instructions. Report your blood sugar to the short stay nurse when you get to Short Stay.  If you are admitted to the hospital after surgery: Your blood sugar will be checked by the staff and you will probably be given insulin after surgery (instead of oral diabetes medicines) to make sure you have good  blood sugar levels. The goal for blood sugar control after surgery is 80-180 mg/dL.                      Do NOT Smoke (Tobacco/Vaping) for 24 hours prior to your procedure.  If you use a CPAP at night, you may bring your mask/headgear for your overnight stay.   Contacts, glasses, piercing's, hearing aid's, dentures or partials may not be worn into surgery, please bring cases for these belongings.    For patients admitted to the hospital, discharge time will be determined by your treatment team.   Patients discharged the day of surgery will not be allowed to drive home, and someone needs to stay with them for 24 hours.  NO VISITORS WILL BE ALLOWED IN PRE-OP WHERE PATIENTS ARE PREPPED FOR SURGERY.  ONLY 1 SUPPORT PERSON MAY BE PRESENT IN THE WAITING ROOM WHILE YOU ARE IN SURGERY.  IF YOU ARE TO BE ADMITTED, ONCE YOU ARE IN YOUR ROOM YOU WILL BE ALLOWED TWO (2) VISITORS. (1) VISITOR MAY STAY OVERNIGHT BUT MUST ARRIVE TO THE ROOM BY 8pm.  Minor children may have two parents present. Special consideration for safety and communication needs will be reviewed on a case by case basis.   Special instructions:   [facility]- Preparing For Surgery  Before surgery, you can play an important role. Because skin is not sterile, your skin needs to be as free of germs as possible. You can reduce the number of germs on your skin by washing with CHG (chlorahexidine gluconate) Soap before surgery.  CHG is an antiseptic cleaner which kills germs and bonds with the skin to continue killing germs even after washing.    Oral Hygiene is also important to reduce your risk of infection.  Remember - BRUSH YOUR TEETH THE MORNING OF SURGERY WITH YOUR REGULAR TOOTHPASTE  Please do not use if you have an allergy to CHG or antibacterial soaps. If your skin becomes reddened/irritated stop using the CHG.  Do not shave (including legs and underarms) for at least 48 hours prior to first CHG shower. It is OK to shave your  face.  Please follow these instructions carefully.   Shower the NIGHT BEFORE SURGERY and the MORNING OF SURGERY  If you chose to wash your hair, wash your hair first as usual with your normal shampoo.  After you shampoo, rinse your hair and body thoroughly to remove the shampoo.  Use CHG Soap as you would any other liquid soap. You can apply CHG directly to the skin and wash gently with a scrungie or a clean washcloth.   Apply the CHG Soap to your body ONLY FROM THE NECK DOWN.  Do not use on open wounds or open sores. Avoid contact with your eyes, ears, mouth and genitals (private parts). Wash Face and genitals (private parts)  with your normal soap.   Wash thoroughly, paying special attention to the area where your surgery will be performed.  Thoroughly rinse your body with warm water from the neck  down.  DO NOT shower/wash with your normal soap after using and rinsing off the CHG Soap.  Pat yourself dry with a CLEAN TOWEL.  Wear CLEAN PAJAMAS to bed the night before surgery  Place CLEAN SHEETS on your bed the night before your surgery  DO NOT SLEEP WITH PETS.   Day of Surgery: Shower with CHG soap. Do not wear jewelry, make up, nail polish, gel polish, artificial nails, or any other type of covering on natural nails including finger and toenails. If patients have artificial nails, gel coating, etc. that need to be removed by a nail salon please have this removed prior to surgery. Surgery may need to be canceled/delayed if the surgeon/anesthesiologist feels like the patient is unable to be adequately monitored. Do not wear lotions, powders, perfumes, or deodorant. Do not shave 48 hours prior to surgery.   Do not bring valuables to the hospital. [facility] is not responsible for any belongings or valuables. Wear Clean/Comfortable clothing the morning of surgery Remember to brush your teeth WITH YOUR REGULAR TOOTHPASTE.   Please read over the following fact sheets that you  were given.   3 days prior to your procedure or After your COVID test   You are not required to quarantine however you are required to wear a well-fitting mask when you are out and around people not in your household. If your mask becomes wet or soiled, replace with a new one.   Wash your hands often with soap and water for 20 seconds or clean your hands with an alcohol-based hand sanitizer that contains at least 60% alcohol.   Do not share personal items.   Notify your provider:  o if you are in close contact with someone who has COVID  o or if you develop a fever of 100.4 or greater, sneezing, cough, sore throat, shortness of breath or body aches.

## 2021-03-12 ENCOUNTER — Other Ambulatory Visit: Payer: Self-pay

## 2021-03-12 ENCOUNTER — Encounter (HOSPITAL_COMMUNITY)
Admission: RE | Admit: 2021-03-12 | Discharge: 2021-03-12 | Disposition: A | Discharge status: Home / Self Care | Payer: Medicare Other | Source: Ambulatory Visit | Attending: General Surgery | Admitting: General Surgery

## 2021-03-12 VITALS — BP 141/73 | HR 100 | Temp 97.7°F | Resp 20 | Ht 65.0 in

## 2021-03-12 DIAGNOSIS — N189 Chronic kidney disease, unspecified: Secondary | ICD-10-CM | POA: Insufficient documentation

## 2021-03-12 DIAGNOSIS — E1142 Type 2 diabetes mellitus with diabetic polyneuropathy: Secondary | ICD-10-CM | POA: Diagnosis not present

## 2021-03-12 DIAGNOSIS — Z87891 Personal history of nicotine dependence: Secondary | ICD-10-CM | POA: Diagnosis not present

## 2021-03-12 DIAGNOSIS — I129 Hypertensive chronic kidney disease with stage 1 through stage 4 chronic kidney disease, or unspecified chronic kidney disease: Secondary | ICD-10-CM | POA: Insufficient documentation

## 2021-03-12 DIAGNOSIS — L723 Sebaceous cyst: Secondary | ICD-10-CM | POA: Insufficient documentation

## 2021-03-12 DIAGNOSIS — G219 Secondary parkinsonism, unspecified: Secondary | ICD-10-CM | POA: Insufficient documentation

## 2021-03-12 DIAGNOSIS — K219 Gastro-esophageal reflux disease without esophagitis: Secondary | ICD-10-CM | POA: Diagnosis not present

## 2021-03-12 DIAGNOSIS — E785 Hyperlipidemia, unspecified: Secondary | ICD-10-CM | POA: Diagnosis not present

## 2021-03-12 DIAGNOSIS — J45909 Unspecified asthma, uncomplicated: Secondary | ICD-10-CM | POA: Diagnosis not present

## 2021-03-12 DIAGNOSIS — E1122 Type 2 diabetes mellitus with diabetic chronic kidney disease: Secondary | ICD-10-CM | POA: Insufficient documentation

## 2021-03-12 DIAGNOSIS — E890 Postprocedural hypothyroidism: Secondary | ICD-10-CM | POA: Diagnosis not present

## 2021-03-12 DIAGNOSIS — D649 Anemia, unspecified: Secondary | ICD-10-CM | POA: Diagnosis not present

## 2021-03-12 DIAGNOSIS — G4733 Obstructive sleep apnea (adult) (pediatric): Secondary | ICD-10-CM | POA: Insufficient documentation

## 2021-03-12 DIAGNOSIS — Z01812 Encounter for preprocedural laboratory examination: Secondary | ICD-10-CM | POA: Insufficient documentation

## 2021-03-12 DIAGNOSIS — Z01818 Encounter for other preprocedural examination: Secondary | ICD-10-CM

## 2021-03-12 DIAGNOSIS — I2510 Atherosclerotic heart disease of native coronary artery without angina pectoris: Secondary | ICD-10-CM | POA: Insufficient documentation

## 2021-03-12 LAB — CBC
HCT: 35 % — ABNORMAL LOW (ref 36.0–46.0)
Hemoglobin: 11.2 g/dL — ABNORMAL LOW (ref 12.0–15.0)
MCH: 28.9 pg (ref 26.0–34.0)
MCHC: 32 g/dL (ref 30.0–36.0)
MCV: 90.2 fL (ref 80.0–100.0)
Platelets: 203 10*3/uL (ref 150–400)
RBC: 3.88 MIL/uL (ref 3.87–5.11)
RDW: 14.1 % (ref 11.5–15.5)
WBC: 7.1 10*3/uL (ref 4.0–10.5)
nRBC: 0 % (ref 0.0–0.2)

## 2021-03-12 LAB — BASIC METABOLIC PANEL
Anion gap: 11 (ref 5–15)
BUN: 25 mg/dL — ABNORMAL HIGH (ref 8–23)
CO2: 26 mmol/L (ref 22–32)
Calcium: 9.4 mg/dL (ref 8.9–10.3)
Chloride: 96 mmol/L — ABNORMAL LOW (ref 98–111)
Creatinine, Ser: 1.15 mg/dL — ABNORMAL HIGH (ref 0.44–1.00)
GFR, Estimated: 53 mL/min — ABNORMAL LOW (ref >60)
Glucose, Bld: 217 mg/dL — ABNORMAL HIGH (ref 70–99)
Potassium: 3.7 mmol/L (ref 3.5–5.1)
Sodium: 133 mmol/L — ABNORMAL LOW (ref 135–145)

## 2021-03-12 LAB — GLUCOSE, CAPILLARY: Glucose-Capillary: 217 mg/dL — ABNORMAL HIGH (ref 70–99)

## 2021-03-12 LAB — SURGICAL PCR SCREEN
MRSA, PCR: NEGATIVE
Staphylococcus aureus: NEGATIVE

## 2021-03-12 NOTE — Progress Notes (Signed)
Anesthesia Chart Review:   Case: [id number] Date/Time: 03/13/21 0915   Procedure: EXCISION SCALP SEBACEOUS CYSTS x2 - 60 MINUTES LOCAL & MAC   Anesthesia type: Monitor Anesthesia Care   Pre-op diagnosis: TWO SCALP SEBACEOUS CYSTS   Location: [facility] ROOM 05 / [facility]   Surgeons: Ralene Ok, MD       DISCUSSION: Patient is a 66 year old female scheduled for the above procedure. Surgery was initially scheduled for 01/24/21, but she had to reschedule due to her transportation falling through.   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 BMI is consistent with morbid obesity.    - Admission [facility] 09/29/20-10/06/20 for AMS/acute metabolic encephalopathy in setting of presumed hypercarbic respiratory failure with questionable drug overdose (notes suggest unintentional) and known OSA/obesity hypoventilation syndrome and COPD.  Home health aide found patient unresponsive at home and contacted EMS. Home medications included multiple pain and psychotropic medications.  Head CT showed no acute abnormality.  She was admitted to the ICU and started on Narcan infusion and mentation improved.  Hyponatremia and hypokalemia supplemented.  Planned discharged to [facility]   - ED visits 11/10/20 and 11/13/20 for urinary retention requiring foley catheter and UTI. Foley cath removed in October, and she is followed by Dr. Amalia Hailey with [facility].    Last endocrinology follow-up with Dr. Loanne Drilling was on 02/26/21. Normal TSH and Free T4. Creatine up to 1.47  from 1.13, so close follow-up advised.  Cr back down to 1.15 on 03/12/21 labs. 02/26/21 A1c up to 10.4%, from 8.3%. He reviewed her continuous glucose monitor with variable readings 130-400. He continued the same Trulicity dose (takes 4.5 mg Q Saturday), but increased NPH 45 Units (from 34 Units) each morning. She currently reports fasting glucose readings ~ 145-215. Staff message sent to Dr. Rosendo Gros regarding A1c and recent adjustments and readings.   If Dr. Rosendo Gros feels labs are acceptable for planned surgery, then she will get a CBG on arrival. If no significant hyperglycemia then would on-going out-patient follow-up and insulin coverage as needed while under anesthesia care. She is on Belbuca 30 mcg film twice daily. (PAT was 1 day before surgery, so instructions only included not taking on the morning of surgery). Post-operative pain orders per anesthesiologist and/or surgeon. Last ASA 03/12/21.   VS: BP (!) 141/73    Pulse 100    Temp 36.5 C (Oral)    Resp 20    Ht 5' 5" (1.651 m)    SpO2 98%    BMI 49.92 kg/m    PROVIDERS: Fredrich Romans, PA is PCP  - Renato Shin, MD is endocrinologist. Last visit 02/26/21.  Alona Bene, MD is urologist ([facility]). Last visit 01/07/21 with Erik Obey, FNP for follow-up overactive bladder and urinary retention and urge incontinence.  Foley discontinued in October. Continue Detrol.  Start Myrbetriq.  Follow-up in 6 weeks with UA and postvoid residual. - She is not routinely followed by cardiology. She was evaluated by Mertie Moores, MD in 2015 during sepsis admission with chest pain, negative troponins. Echo showed normal LV systolic function. Myoview showed some anterior apical thinning which was thought due to breast artifact. As needed cardiology follow-up recommended at 09/02/13  visit. Inpatient consult in May 2017 during admission for CAP/gastroenteritis/SIRS/sepsis. Echo showed normal LVEF. No inpatient ischemic work-up planned at that time.     LABS:  Preoperative labs noted.  (all labs ordered are listed, but only abnormal results are displayed)  Labs Reviewed  GLUCOSE, CAPILLARY - Abnormal; Notable for the following components:      Result Value   Glucose-Capillary 217 (*)    All other components within normal limits  BASIC METABOLIC PANEL - Abnormal; Notable for the following components:   Sodium 133 (*)    Chloride 96 (*)    Glucose, Bld 217 (*)    BUN 25 (*)    Creatinine, Ser 1.15 (*)    GFR, Estimated 53 (*)    All other components within normal limits  CBC - Abnormal; Notable for the following components:   Hemoglobin 11.2 (*)    HCT 35.0 (*)    All other components within normal limits  SURGICAL PCR SCREEN    Normal spirometry 10/28/16.     IMAGES: 1V PCXR 09/29/20: IMPRESSION: - Cardiomegaly and probable mild edema. - Patchy opacity in the left greater than right bases could represent atelectasis or infiltrate. Recommend attention on follow-up. - The new left central line is in good position terminating in the SVC with no pneumothorax.   CT chest 09/29/20: IMPRESSION: Changes of mild pulmonary edema. Small sliding-type hiatal hernia. No other significant abnormality is seen. Aortic Atherosclerosis (ICD10-I70.0).     EKG: 09/29/20 (in setting of OD in setting of polypharmacy): Sinus rhythm Borderline left axis deviation Low voltage, precordial leads Abnormal R-wave progression, early transition Prolonged QT interval [QT 414 ms, QTcB 524 ms] Confirmed by Octaviano Glow [phone]) on 09/29/2020 3:10:04 PM     CV: Echo 03/30/16 Study Conclusions  - Left ventricle: The cavity size was normal. There was mild focal    basal hypertrophy of the septum. Systolic function was vigorous.    The estimated ejection fraction was in the range of 65% to 70%.    Wall motion was normal; there were no regional wall motion    abnormalities. Doppler parameters are consistent with abnormal    left ventricular relaxation  (grade 1 diastolic dysfunction).  - Aortic valve: Transvalvular velocity was minimally increased.    There was no stenosis.  - Left atrium: The atrium was mildly dilated.  - Pulmonary arteries: Systolic pressure was moderately increased.    PA peak pressure: 49 mm Hg (S).      Lexiscan Myoview 08/11/13 IMPRESSION:  Lexiscan myoview: Electrically negative for ischemia. Myoview scan  with mild thinning in the mid/distal anterior/anterolateral wall May  reflect mild ischemia, cannot exclude shifting soft tissue (breast).  LVEF on gating LVEF is calculated at greater than 70%.   Past Medical History:  Diagnosis Date   Anemia    Anxiety    Arthritis    "pretty much all my joints"   Asthma    Benign paroxysmal positional vertigo 12/30/2012   Breast cancer ([place]) 02/13/12   ruq  100'clock bx Ductal Carcinoma in Situ,(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   Breast mass in female    L breast 2008, US showed likely fat necrosis   Chronic lower back pain    CKD (chronic kidney disease), stage III ([place])    "lower stage" (01/06/2014)   Daily headache    last time was 2016/09/21 (brother passed away)   Depression    Dyspnea    Gait abnormality 05/29/2016   GERD (gastroesophageal reflux  disease)    History of blood transfusion 2011   "after one of my OR's"   History of hiatal hernia    History of stomach ulcers    HSV (herpes simplex virus) infection    Hx of radiation therapy 05/05/12- 07/15/12   right breast, 45 gray x 25 fx, lumpectomy cavity boosted to 16.2 gray   Hyperlipemia    Hypertension    sees Dr. Criss Rosales , [facility]   Hypothyroidism    Kidney stones    Knee pain, bilateral    Memory difficulty 12/22/2017   Migraines    "~ 3 times/month" (01/06/2014)   Obesity    OSA on CPAP    pt does not know settings   Pancreatitis    hx of   Personal history of radiation therapy 02/2012   Pneumonia 1950's   Polyneuropathy in diabetes(357.2)    Schizophrenia ([place])    Secondary parkinsonism ([place])  12/30/2012   Tachycardia    with sx in 2008   Thyroid disease    Type II diabetes mellitus ([place])    dx 2014    Past Surgical History:  Procedure Laterality Date   ABDOMINAL HYSTERECTOMY  1979?   partial   benign cyst  Left    removed from left breast   BREAST BIOPSY Right 02/13/2012   BREAST LUMPECTOMY  03/03/2012   Procedure: LUMPECTOMY;  Surgeon: Haywood Lasso, MD;  Location: [facility];  Service: General;  Laterality: Right;   BREAST SURGERY Right 01/2012   "cancer"   CHOLECYSTECTOMY  1980's?   COLONOSCOPY WITH PROPOFOL N/A 09/28/2012   Procedure: COLONOSCOPY WITH PROPOFOL;  Surgeon: Lear Ng, MD;  Location: [facility];  Service: Endoscopy;  Laterality: N/A;   FOOT FRACTURE SURGERY Right 1990's   JOINT REPLACEMENT      x 3   LUMBAR LAMINECTOMY/DECOMPRESSION MICRODISCECTOMY N/A 12/05/2016   Procedure: LEFT L5-S1 MICRODISCECTOMY;  Surgeon: Marybelle Killings, MD;  Location: [facility];  Service: Orthopedics;  Laterality: N/A;   REVISION TOTAL KNEE ARTHROPLASTY  07/2009   SHOULDER OPEN ROTATOR CUFF REPAIR Left 1990's   THYROIDECTOMY  1970's   TOTAL KNEE ARTHROPLASTY Bilateral 2009-06/2009   left; right    MEDICATIONS:  acetaminophen (TYLENOL) 650 MG CR tablet   albuterol (VENTOLIN HFA) 108 (90 Base) MCG/ACT inhaler   amLODipine (NORVASC) 10 MG tablet   Ascorbic Acid (VITAMIN C PO)   aspirin EC 81 MG tablet   BELBUCA 300 MCG FILM   belladonna-opium (B&O SUPPRETTES) 16.2-30 MG suppository   calcitRIOL (ROCALTROL) 0.25 MCG capsule   Cholecalciferol (VITAMIN D-3) 1000 units CAPS   Cyanocobalamin (VITAMIN B-12 PO)   cyclobenzaprine (FLEXERIL) 10 MG tablet   doxepin (SINEQUAN) 50 MG capsule   esomeprazole (NEXIUM) 40 MG capsule   famotidine (PEPCID) 40 MG tablet   ferrous sulfate 325 (65 FE) MG tablet   furosemide (LASIX) 20 MG tablet   Insulin NPH, Human,, Isophane, (HUMULIN N KWIKPEN) 100 UNIT/ML Kiwkpen   Lancets (ONETOUCH DELICA PLUS [id number]) MISC   levothyroxine  (SYNTHROID) 200 MCG tablet   linaclotide (LINZESS) 290 MCG CAPS capsule   LORazepam (ATIVAN) 0.5 MG tablet   LORazepam (ATIVAN) 1 MG tablet   losartan-hydrochlorothiazide (HYZAAR) 100-25 MG tablet   lubiprostone (AMITIZA) 24 MCG capsule   methocarbamol (ROBAXIN) 750 MG tablet   mirabegron ER (MYRBETRIQ) 25 MG TB24 tablet   naloxone (NARCAN) nasal spray 4 mg/0.1 mL   ONETOUCH VERIO test strip   penicillin v potassium (VEETID) 500 MG  tablet   polyvinyl alcohol (LIQUIFILM TEARS) 1.4 % ophthalmic solution   potassium chloride SA (KLOR-CON) 20 MEQ tablet   pregabalin (LYRICA) 300 MG capsule   rosuvastatin (CRESTOR) 20 MG tablet   sucralfate (CARAFATE) 1 g tablet   SURE COMFORT PEN NEEDLES 32G X 6 MM MISC   tolterodine (DETROL LA) 2 MG 24 hr capsule   TRULICITY 4.5 WR/6.0AV SOPN   valACYclovir (VALTREX) 1000 MG tablet   venlafaxine XR (EFFEXOR-XR) 150 MG 24 hr capsule   Vitamin D, Ergocalciferol, (DRISDOL) 1.25 MG (50000 UNIT) CAPS capsule   ziprasidone (GEODON) 60 MG capsule   No current facility-administered medications for this encounter.   Belladonna-opium suppositories for as needed bladder spasms listed as not taking. She uses Ativan 1 mg tablets TID, but 0.5 mg as needed tablets are listed as not taking.   Myra Gianotti, PA-C Surgical Short Stay/Anesthesiology [facility] Phone [phone] [facility] Phone [phone] 03/12/2021 11:44 AM

## 2021-03-12 NOTE — Progress Notes (Signed)
PCP - Fredrich Romans, PA Cardiologist - denies-has not seen a provider since 2015  PPM/ICD - n/a  Chest x-ray - 09/29/20 EKG - 09/29/20 Stress Test - 2015 ECHO - 2018 Cardiac Cath - denies  Sleep Study - OSA+ CPAP - uses nightly   Fasting Blood Sugar - 145-215 Checks Blood Sugar 2 times a day CBG at PAT 217. Last A1C 02/26/21-10.4  Blood Thinner Instructions: n/a Aspirin Instructions:pt reports she was not given any instructions on ASA. Took a dose this morning. Instructed not to take ASA DOS.  ERAS Protcol -Clear liquids until 0730 DOS PRE-SURGERY Ensure or G2- Pt provided with (1) G2.  COVID TEST- not indicated. Ambulatory surgery.   Anesthesia review: Yes, A1C 10.4  Patient denies shortness of breath, fever, cough and chest pain at PAT appointment   All instructions explained to the patient, with a verbal understanding of the material. Patient agrees to go over the instructions while at home for a better understanding. Patient also instructed to self quarantine after being tested for COVID-19. The opportunity to ask questions was provided.

## 2021-03-12 NOTE — Anesthesia Preprocedure Evaluation (Addendum)
Anesthesia Evaluation  Patient identified by MRN, date of birth, ID band Patient awake    Reviewed: Allergy & Precautions, NPO status , Patient's Chart, lab work & pertinent test results  Airway Mallampati: III  TM Distance: >3 FB Neck ROM: Full    Dental no notable dental hx.    Pulmonary asthma , sleep apnea and Continuous Positive Airway Pressure Ventilation , former smoker,     + decreased breath sounds      Cardiovascular hypertension, Pt. on medications  Rhythm:Regular Rate:Normal     Neuro/Psych  Headaches, Anxiety Depression Schizophrenia parkinsonism TIA   GI/Hepatic hiatal hernia, GERD  Medicated,  Endo/Other  diabetes, Type 2, Insulin DependentHypothyroidism Morbid obesity  Renal/GU CRFRenal disease     Musculoskeletal  (+) Arthritis , Osteoarthritis,  Breast Ca   Abdominal Normal abdominal exam  (+)   Peds  Hematology  (+) anemia ,   Anesthesia Other Findings   Reproductive/Obstetrics                           Anesthesia Physical Anesthesia Plan  ASA: 3  Anesthesia Plan: MAC   Post-op Pain Management:    Induction: Intravenous  PONV Risk Score and Plan: 2 and Ondansetron, Treatment may vary due to age or medical condition and Propofol infusion  Airway Management Planned: Simple Face Mask, Natural Airway and Nasal Cannula  Additional Equipment: None  Intra-op Plan:   Post-operative Plan:   Informed Consent: I have reviewed the patients History and Physical, chart, labs and discussed the procedure including the risks, benefits and alternatives for the proposed anesthesia with the patient or authorized representative who has indicated his/her understanding and acceptance.     Dental advisory given  Plan Discussed with: CRNA  Anesthesia Plan Comments: (PAT note written 03/12/2021 by Myra Gianotti, PA-C.  Lab Results      Component                Value                Date                      WBC                      7.1                 03/12/2021                HGB                      11.2 (L)            03/12/2021                HCT                      35.0 (L)            03/12/2021                MCV                      90.2                03/12/2021                PLT  203                 03/12/2021           Lab Results      Component                Value               Date                      NA                       133 (L)             03/12/2021                K                        3.7                 03/12/2021                CO2                      26                  03/12/2021                GLUCOSE                  217 (H)             03/12/2021                BUN                      25 (H)              03/12/2021                CREATININE               1.15 (H)            03/12/2021                CALCIUM                  9.4                 03/12/2021                EGFR                     64 (L)              03/26/2015                GFRNONAA                 53 (L)              03/12/2021           Echo 03/30/16 Study Conclusions  - Left ventricle: The cavity size was normal. There was mild focal  basal hypertrophy of the septum. Systolic function was vigorous.  The estimated ejection fraction was in the range of 65% to 70%.  Wall motion was normal; there were no regional wall motion  abnormalities. Doppler parameters  are consistent with abnormal  left ventricular relaxation (grade 1 diastolic dysfunction).  - Aortic valve: Transvalvular velocity was minimally increased.  There was no stenosis.  - Left atrium: The atrium was mildly dilated.  - Pulmonary arteries: Systolic pressure was moderately increased.  PA peak pressure: 49 mm Hg (S).   Lexiscan Myoview6/25/15 IMPRESSION:  Lexiscan myoview: Electrically negative for ischemia. Myoview scan  with mild thinning in the mid/distal  anterior/anterolateral wall May  reflect mild ischemia, cannot exclude shifting soft tissue (breast).  LVEF on gating LVEF is calculated at greater than 70%. )      Anesthesia Quick Evaluation

## 2021-03-12 NOTE — Progress Notes (Signed)
Pt called to change arrival time from 0830 to 0730, however pt uses a ride service and they are closed for the day and unable to get her any earlier. OR aware, Pt's arrival time will remain the same at 0830

## 2021-03-13 ENCOUNTER — Other Ambulatory Visit: Payer: Self-pay | Admitting: Endocrinology

## 2021-03-13 ENCOUNTER — Ambulatory Visit (HOSPITAL_COMMUNITY): Payer: Medicare Other | Admitting: Certified Registered Nurse Anesthetist

## 2021-03-13 ENCOUNTER — Ambulatory Visit (HOSPITAL_COMMUNITY)
Admission: RE | Admit: 2021-03-13 | Discharge: 2021-03-13 | Disposition: A | Discharge status: Home / Self Care | Payer: Medicare Other | Attending: General Surgery | Admitting: General Surgery

## 2021-03-13 ENCOUNTER — Encounter (HOSPITAL_COMMUNITY)
Admission: RE | Disposition: A | Discharge status: Home / Self Care | Payer: Self-pay | Source: Home / Self Care | Attending: General Surgery

## 2021-03-13 ENCOUNTER — Other Ambulatory Visit: Payer: Self-pay

## 2021-03-13 ENCOUNTER — Ambulatory Visit (HOSPITAL_COMMUNITY): Payer: Medicare Other | Admitting: Vascular Surgery

## 2021-03-13 ENCOUNTER — Encounter (HOSPITAL_COMMUNITY): Payer: Self-pay | Admitting: General Surgery

## 2021-03-13 DIAGNOSIS — I129 Hypertensive chronic kidney disease with stage 1 through stage 4 chronic kidney disease, or unspecified chronic kidney disease: Secondary | ICD-10-CM | POA: Insufficient documentation

## 2021-03-13 DIAGNOSIS — Z6841 Body Mass Index (BMI) 40.0 and over, adult: Secondary | ICD-10-CM | POA: Diagnosis not present

## 2021-03-13 DIAGNOSIS — L720 Epidermal cyst: Secondary | ICD-10-CM | POA: Insufficient documentation

## 2021-03-13 DIAGNOSIS — Z794 Long term (current) use of insulin: Secondary | ICD-10-CM

## 2021-03-13 DIAGNOSIS — N189 Chronic kidney disease, unspecified: Secondary | ICD-10-CM | POA: Diagnosis not present

## 2021-03-13 DIAGNOSIS — Z7984 Long term (current) use of oral hypoglycemic drugs: Secondary | ICD-10-CM | POA: Diagnosis not present

## 2021-03-13 DIAGNOSIS — Z833 Family history of diabetes mellitus: Secondary | ICD-10-CM | POA: Diagnosis not present

## 2021-03-13 DIAGNOSIS — E1122 Type 2 diabetes mellitus with diabetic chronic kidney disease: Secondary | ICD-10-CM | POA: Insufficient documentation

## 2021-03-13 DIAGNOSIS — G4730 Sleep apnea, unspecified: Secondary | ICD-10-CM | POA: Insufficient documentation

## 2021-03-13 DIAGNOSIS — L723 Sebaceous cyst: Secondary | ICD-10-CM | POA: Diagnosis present

## 2021-03-13 HISTORY — PX: LESION EXCISION: SHX5167

## 2021-03-13 LAB — GLUCOSE, CAPILLARY
Glucose-Capillary: 179 mg/dL — ABNORMAL HIGH (ref 70–99)
Glucose-Capillary: 165 mg/dL — ABNORMAL HIGH (ref 70–99)

## 2021-03-13 SURGERY — EXCISION, LESION, SCALP
Anesthesia: Monitor Anesthesia Care | Site: Head

## 2021-03-13 MED ORDER — MIDAZOLAM HCL 2 MG/2ML IJ SOLN
INTRAMUSCULAR | Status: AC
  Filled 2021-03-13: qty 2

## 2021-03-13 MED ORDER — FENTANYL CITRATE (PF) 100 MCG/2ML IJ SOLN: 25.0000 ug | INTRAMUSCULAR | Status: DC | PRN

## 2021-03-13 MED ORDER — CEFAZOLIN SODIUM-DEXTROSE 2-4 GM/100ML-% IV SOLN
2.0000 g | INTRAVENOUS | Status: AC
  Administered 2021-03-13: 11:00:00 2 g via INTRAVENOUS
  Filled 2021-03-13: qty 100

## 2021-03-13 MED ORDER — FENTANYL CITRATE (PF) 250 MCG/5ML IJ SOLN
INTRAMUSCULAR | Status: DC | PRN
  Administered 2021-03-13 (×2): 50 ug via INTRAVENOUS

## 2021-03-13 MED ORDER — MIDAZOLAM HCL 2 MG/2ML IJ SOLN
INTRAMUSCULAR | Status: DC | PRN
Start: 2021-03-13 — End: 2021-03-13
  Administered 2021-03-13 (×3): 2 mg via INTRAVENOUS

## 2021-03-13 MED ORDER — BUPIVACAINE HCL (PF) 0.25 % IJ SOLN
INTRAMUSCULAR | Status: DC | PRN
  Administered 2021-03-13: 8 mL

## 2021-03-13 MED ORDER — CHLORHEXIDINE GLUCONATE CLOTH 2 % EX PADS: 6.0000 | MEDICATED_PAD | Freq: Once | CUTANEOUS | Status: DC

## 2021-03-13 MED ORDER — FENTANYL CITRATE (PF) 250 MCG/5ML IJ SOLN
INTRAMUSCULAR | Status: AC
  Filled 2021-03-13: qty 5

## 2021-03-13 MED ORDER — 0.9 % SODIUM CHLORIDE (POUR BTL) OPTIME
TOPICAL | Status: DC | PRN
Start: 2021-03-13 — End: 2021-03-13
  Administered 2021-03-13: 11:00:00 1000 mL

## 2021-03-13 MED ORDER — BACITRACIN ZINC 500 UNIT/GM EX OINT
TOPICAL_OINTMENT | CUTANEOUS | Status: AC
  Filled 2021-03-13: qty 28.35

## 2021-03-13 MED ORDER — BUPIVACAINE-EPINEPHRINE (PF) 0.25% -1:200000 IJ SOLN
INTRAMUSCULAR | Status: AC
  Filled 2021-03-13: qty 30

## 2021-03-13 MED ORDER — PROPOFOL 500 MG/50ML IV EMUL
INTRAVENOUS | Status: DC | PRN
  Administered 2021-03-13: 25 ug/kg/min via INTRAVENOUS

## 2021-03-13 MED ORDER — PHENYLEPHRINE HCL (PRESSORS) 10 MG/ML IV SOLN
INTRAVENOUS | Status: DC | PRN
  Administered 2021-03-13 (×3): 120 ug via INTRAVENOUS

## 2021-03-13 MED ORDER — PHENYLEPHRINE 40 MCG/ML (10ML) SYRINGE FOR IV PUSH (FOR BLOOD PRESSURE SUPPORT)
PREFILLED_SYRINGE | INTRAVENOUS | Status: AC
  Filled 2021-03-13: qty 10

## 2021-03-13 MED ORDER — CHLORHEXIDINE GLUCONATE 0.12 % MT SOLN
15.0000 mL | Freq: Once | OROMUCOSAL | Status: AC
  Administered 2021-03-13: 10:00:00 15 mL via OROMUCOSAL
  Filled 2021-03-13: qty 15

## 2021-03-13 MED ORDER — BUPIVACAINE-EPINEPHRINE 0.25% -1:200000 IJ SOLN
INTRAMUSCULAR | Status: DC | PRN
  Administered 2021-03-13: 22 mL

## 2021-03-13 MED ORDER — ORAL CARE MOUTH RINSE: 15.0000 mL | Freq: Once | OROMUCOSAL | Status: AC

## 2021-03-13 MED ORDER — LACTATED RINGERS IV SOLN: INTRAVENOUS | Status: DC

## 2021-03-13 MED ORDER — TRAMADOL HCL 50 MG PO TABS
50.0000 mg | ORAL_TABLET | Freq: Four times a day (QID) | ORAL | 0 refills | Status: DC | PRN
Start: 2021-03-13 — End: 2021-09-13

## 2021-03-13 MED ORDER — ACETAMINOPHEN 500 MG PO TABS
1000.0000 mg | ORAL_TABLET | ORAL | Status: AC
  Administered 2021-03-13: 10:00:00 1000 mg via ORAL
  Filled 2021-03-13: qty 2

## 2021-03-13 MED ORDER — BUPIVACAINE HCL (PF) 0.25 % IJ SOLN
INTRAMUSCULAR | Status: AC
  Filled 2021-03-13: qty 30

## 2021-03-13 MED ORDER — ENSURE PRE-SURGERY PO LIQD: 296.0000 mL | Freq: Once | ORAL | Status: DC

## 2021-03-13 SURGICAL SUPPLY — 38 items
ADH SKN CLS APL DERMABOND .7 (GAUZE/BANDAGES/DRESSINGS) ×1
APL PRP STRL LF DISP 70% ISPRP (MISCELLANEOUS) ×1
BAG COUNTER SPONGE SURGICOUNT (BAG) ×2 IMPLANT
BAG SPNG CNTER NS LX DISP (BAG) ×1
CANISTER SUCT 3000ML PPV (MISCELLANEOUS) IMPLANT
CHLORAPREP W/TINT 26 (MISCELLANEOUS) ×2 IMPLANT
COVER SURGICAL LIGHT HANDLE (MISCELLANEOUS) ×2 IMPLANT
DECANTER SPIKE VIAL GLASS SM (MISCELLANEOUS) ×4 IMPLANT
DERMABOND ADVANCED (GAUZE/BANDAGES/DRESSINGS) ×1
DERMABOND ADVANCED .7 DNX12 (GAUZE/BANDAGES/DRESSINGS) ×1 IMPLANT
DRAPE LAPAROTOMY 100X72 PEDS (DRAPES) ×2 IMPLANT
ELECT REM PT RETURN 9FT ADLT (ELECTROSURGICAL) ×2
ELECTRODE REM PT RTRN 9FT ADLT (ELECTROSURGICAL) ×1 IMPLANT
GLOVE SRG 8 PF TXTR STRL LF DI (GLOVE) ×1 IMPLANT
GLOVE SURG SYN 7.5  E (GLOVE) ×2
GLOVE SURG SYN 7.5 E (GLOVE) ×1 IMPLANT
GLOVE SURG SYN 7.5 PF PI (GLOVE) ×1 IMPLANT
GLOVE SURG UNDER POLY LF SZ8 (GLOVE) ×2
GOWN STRL REUS W/ TWL LRG LVL3 (GOWN DISPOSABLE) ×1 IMPLANT
GOWN STRL REUS W/ TWL XL LVL3 (GOWN DISPOSABLE) ×1 IMPLANT
GOWN STRL REUS W/TWL LRG LVL3 (GOWN DISPOSABLE) ×2
GOWN STRL REUS W/TWL XL LVL3 (GOWN DISPOSABLE) ×2
KIT BASIN OR (CUSTOM PROCEDURE TRAY) ×2 IMPLANT
KIT TURNOVER KIT B (KITS) ×2 IMPLANT
NDL HYPO 25GX1X1/2 BEV (NEEDLE) ×1 IMPLANT
NEEDLE HYPO 25GX1X1/2 BEV (NEEDLE) ×2 IMPLANT
NS IRRIG 1000ML POUR BTL (IV SOLUTION) ×2 IMPLANT
PACK GENERAL/GYN (CUSTOM PROCEDURE TRAY) ×2 IMPLANT
PAD ARMBOARD 7.5X6 YLW CONV (MISCELLANEOUS) ×2 IMPLANT
PENCIL SMOKE EVACUATOR (MISCELLANEOUS) ×2 IMPLANT
SUT MNCRL AB 4-0 PS2 18 (SUTURE) ×2 IMPLANT
SUT PROLENE 4 0 PS 2 18 (SUTURE) ×1 IMPLANT
SUT VIC AB 2-0 CP2 18 (SUTURE) ×2 IMPLANT
SUT VIC AB 3-0 SH 27 (SUTURE) ×2
SUT VIC AB 3-0 SH 27XBRD (SUTURE) ×1 IMPLANT
SYR CONTROL 10ML LL (SYRINGE) ×2 IMPLANT
TOWEL GREEN STERILE (TOWEL DISPOSABLE) ×2 IMPLANT
TOWEL GREEN STERILE FF (TOWEL DISPOSABLE) ×2 IMPLANT

## 2021-03-13 NOTE — H&P (Signed)
vSubjective   Chief Complaint: No chief complaint on file.       History of Present Illness: [name] is a 66 y.o. female who is seen today as an office consultation at the request of Dr. None for evaluation of No chief complaint on file. .   Patient is a 66 year old female who follows up today secondary to a left posterior scalp sebaceous cyst, and previous right posterior ear cyst that was drained in July 2022.   Patient states that she has continued itching in both areas.  She has no further signs of infection or drainage from each area.  She does state that both areas are itching very intensely.           Review of Systems: A complete review of systems was obtained from the patient.  I have reviewed this information and discussed as appropriate with the patient.  See HPI as well for other ROS.   Review of Systems  Constitutional: Negative for fever.  HENT: Negative for congestion.   Eyes: Negative for blurred vision.  Respiratory: Negative for cough, shortness of breath and wheezing.   Cardiovascular: Negative for chest pain and palpitations.  Gastrointestinal: Negative for heartburn.  Genitourinary: Negative for dysuria.  Musculoskeletal: Negative for myalgias.  Skin: Negative for rash.  Neurological: Negative for dizziness and headaches.  Psychiatric/Behavioral: Negative for depression and suicidal ideas.  All other systems reviewed and are negative.       Medical History: Past Medical History Past Medical History: Diagnosis        Date            Anxiety                         Arthritis                         Asthma, unspecified asthma severity, unspecified whether complicated, unspecified whether persistent                       GERD (gastroesophageal reflux disease)                   Glaucoma (increased eye pressure)               History of cancer                      Sleep apnea                 Thyroid disease                  There is no problem  list on file for this patient.     Past Surgical History Past Surgical History: Procedure       Laterality         Date            breast cancer right side          N/A                   right foot broken in 7 places   N/A                   thyroid removed surgery         N/A              Allergies Allergies Allergen  Reactions            Shellfish Containing Products Anaphylaxis and Swelling                           All shellfish              Venom-Honey Bee     Anaphylaxis, Other (See Comments) and Hives       Current Outpatient Medications on File Prior to Visit Medication       Sig       Dispense         Refill            albuterol (PROVENTIL) 2.5 mg /3 mL (0.083 %) nebulizer solution albuterol sulfate 2.5 mg/3 mL (0.083 %) solution for nebulization                                 albuterol 90 mcg/actuation inhaler     albuterol sulfate HFA 90 mcg/actuation aerosol inhaler                                     alcohol swabs PadM   Alcohol Pads  USE AS DIRECTED FIVE TIMES DAILY                               budesonide-formoteroL (SYMBICORT) 80-4.5 mcg/actuation inhaler          Symbicort 80 mcg-4.5 mcg/actuation HFA aerosol inhaler                                  esomeprazole (NEXIUM) 40 MG DR capsule            esomeprazole magnesium 40 mg capsule,delayed release                                   HUMULIN N NPH INSULIN KWIKPEN pen injector (concentration 100 units/mL)  Inject 34 Units into the skin every morning. And pen needles 1/day                                   hydrOXYzine (ATARAX) 25 MG tablet           hydroxyzine HCl 25 mg tablet                           insulin DETEMIR (LEVEMIR FLEXTOUCH U-100 INSULN) pen injector (concentration 100 units/mL) Levemir FlexTouch U-100 Insulin 100 unit/mL (3 mL) subcutaneous pen  Inject 10 unit at bedtime                                  insulin LISPRO (HUMALOG KWIKPEN INSULIN) pen injector (concentration 100 units/mL)         Humalog KwikPen (U-100) Insulin 100 unit/mL subcutaneous  levothyroxine (SYNTHROID) 175 MCG tablet           levothyroxine 175 mcg tablet                            LORazepam (ATIVAN) 0.5 MG tablet            lorazepam 0.5 mg tablet                                   losartan (COZAAR) 100 MG tablet     losartan 100 mg tablet                                      methocarbamoL (ROBAXIN) 500 MG tablet  methocarbamol 500 mg tablet                                      methocarbamoL (ROBAXIN) 750 MG tablet  Take 1 Tablet three times daily, as needed for muscle spasms                              metoprolol tartrate (LOPRESSOR) 25 MG tablet      metoprolol tartrate 25 mg tablet  Take 1 tablet twice a day by oral route.                                  multivitamin with B complex-vitamin C (FARBEE WITH C) tablet     Take 1 tablet by mouth once daily                             naloxone (NARCAN) 4 mg/actuation nasal spray      instill ONE SPRAY in one nostril, REPEAT EVERY THREE minutes if still unconscious OR minimally conscious. Call 911                            naloxone [facility]) 4 mg/actuation nasal spray      Narcan 4 mg/actuation nasal spray                            ofloxacin (FLOXIN) 0.3 % otic solution          ofloxacin 0.3 % ear drops                                 ONETOUCH VERIO TEST STRIPS test strip           3 (three) times daily                              oxyCODONE (DAZIDOX) 10 mg immediate release tablet   oxycodone 10 mg tablet                            oxyCODONE (ROXICODONE) 15 MG immediate release tablet  Take 1 (one) Tablet five times daily, as needed                               polyethylene glycol (MIRALAX) powder         polyethylene glycol 3350 17 gram/dose oral powder                                    potassium chloride (K-TAB) 20 mEq TbER ER tablet           daily                               pregabalin (LYRICA) 300 MG capsule            Take 1 Capsule two times daily for neuropathy                               ranitidine (ZANTAC) 150 MG tablet   ranitidine 150 mg tablet  Take 1 tablet by mouth daily in the evening                            sitaGLIPtin-metFORMIN (JANUMET) 50-500 mg tablet       Janumet 50 mg-500 mg tablet                                  sucralfate (CARAFATE) 1 gram tablet           1 (one) Tablet Tablet by mouth four times daily, as needed                                     sulfamethoxazole-trimethoprim (BACTRIM DS) 800-160 mg tablet  Take 1 (one) Tablet by mouth two times daily                                  tolterodine (DETROL LA) 2 MG LA capsule                                         triamcinolone 0.1 % ointment triamcinolone acetonide 0.1 % topical ointment                                   valACYclovir (VALTREX) 1000 MG tablet     valacyclovir 1 gram tablet                                 valACYclovir (VALTREX) 1000 MG tablet     Take 1,000 mg by mouth once daily                            venlafaxine (EFFEXOR-XR) 150 MG XR capsule     venlafaxine ER 150 mg capsule,extended release 24  hr                                  XTAMPZA ER 27 mg ER capsule      Take 1 (one) Capsule by mouth at bedtime                             ziprasidone (GEODON) 60 MG capsule        ziprasidone 60 mg capsule                      No current facility-administered medications on file prior to visit.     Family History Family History Problem           Relation           Age of Onset            Stroke  Mother              Obesity            Mother              High blood pressure (Hypertension)   Mother              Diabetes          Mother              Breast cancer  Mother              Myocardial Infarction (Heart attack)   Mother              Stroke  Sister                Obesity            Sister                High blood pressure (Hypertension)   Sister                Deep vein thrombosis (DVT or abnormal blood  clot formation)        Sister                Breast cancer  Sister                High blood pressure (Hypertension)   Brother                         Myocardial Infarction (Heart attack)   Brother                    Social History   Tobacco Use Smoking Status           Never Smoker Smokeless Tobacco   Never Used     Social History Social History     Socioeconomic History            Marital status:  Single Tobacco Use            Smoking status:          Never Smoker            Smokeless tobacco:    Never Used Cytogeneticist Use:    Never used Substance and Sexual Activity  Alcohol use:    Not Currently            Drug use:        Never       Objective:     There were no vitals filed for this visit.  There is no height or weight on file to calculate BMI.   Physical Exam Constitutional:      Appearance: Normal appearance.  HENT:     Head: Normocephalic and atraumatic.     Mouth/Throat:     Mouth: Mucous membranes are moist.     Pharynx: Oropharynx is clear.  Eyes:     General: No scleral icterus.    Pupils: Pupils are equal, round, and reactive to light.  Cardiovascular:     Rate and Rhythm: Normal rate and regular rhythm.     Pulses: Normal pulses.     Heart sounds: No murmur heard.   No friction rub. No gallop.  Pulmonary:     Effort: Pulmonary effort is normal. No respiratory distress.     Breath sounds: Normal breath sounds. No stridor.  Abdominal:     General: Abdomen is flat.  Musculoskeletal:        General: No swelling.  Skin:    General: Skin is warm.     Comments: Left posterior scalp cyst, 1 cm, right posterior ear cyst, elongated, 3 x 1 cm  Neurological:     General: No focal deficit present.     Mental Status: She is alert and oriented to person, place, and time. Mental status is at baseline.  Psychiatric:        Mood and Affect: Mood normal.        Thought Content: Thought content normal.        Judgment:  Judgment normal.          Assessment and Plan: Diagnoses and all orders for this visit:   Sebaceous cyst     [name] is a 66 y.o. female    1.           we will proceed to the operating for local MAC excision of cyst x2. 2.         [place] I discussed with the risk benefits the procedure to include but not limited to: Infection, bleeding, damage surrounding structures, possible recurrence.  Patient voiced understanding wishes to proceed.           No follow-ups on file.   Ralene Ok, MD, [facility], [facility]

## 2021-03-13 NOTE — Op Note (Signed)
03/13/2021  11:35 AM  PATIENT:  [name]  66 y.o. female  PRE-OPERATIVE DIAGNOSIS:  TWO SCALP SEBACEOUS CYSTS  POST-OPERATIVE DIAGNOSIS:  TWO SCALP SEBACEOUS CYSTS 4.5 x 1.5cm R ear, L scalp 1x1.5cm   PROCEDURE:  Procedure(s) with comments: EXCISION SCALP SEBACEOUS CYSTS x2 (N/A)   SURGEON:  Surgeon(s) and Role:    Ralene Ok, MD - Primary    ANESTHESIA:   local and general  EBL:  minimal   BLOOD ADMINISTERED:none  DRAINS: none   LOCAL MEDICATIONS USED:  BUPIVICAINE   SPECIMEN:  Source of Specimen:  as above  DISPOSITION OF SPECIMEN:  PATHOLOGY  COUNTS:  YES  TOURNIQUET:  * No tourniquets in log *  DICTATION: .Dragon Dictation  Indication procedure: Patient is a 66 year old female who came in secondary to a right posterior ear mass, as well as a left scalp mass.  Patient states that these were painful and would have a slightly removed.  Findings: Patient had a 1.5 x 1 cm left scalp mass.  This was in the subcutaneous space.  Patient also with a right 4.5 x 1.5 cm right posterior ear mass.  This was also the subcuticular space.  Details procedure: As the patient was consented he was taken back to the OR and placed supine position with bilateral SCDs in place.  She underwent MAC anesthesia.  Patient was then prepped and draped in standard fashion.  A timeout was called all facts verified.    An elliptical incision was made over the area of the right ear.  This was infiltrated with bupivacaine.  Dissection was done sharply down and circumferentially around the mass.  This was excised in its entirety.  This did seem to consist of some deep scar tissue.  2-0 Vicryl's were used in interrupted fashion reapproximate the deep dermal layer.  4-0 Prolene's were then used in a horizontal mattress suture style to reapproximate the skin.  This was dressed with triple antibiotic and 4 x 4's and tape.  Patient was then repositioned and reprepped for the left scalp mass.   The mass was circumferentially infiltrated with quarter percent Marcaine with epi.  Elliptical incision was made.  Sharp dissection was taken circumferentially around the cyst.  This was kept intact.  At this time 2-0 Vicryl was used interrupted fashion reapproximate the deep dermal layer.  4-0 Monocryl was used to reapproximate the skin.  Skin was dressed Dermabond.  Patient tolerated the procedure well was taken to the recovery in stable condition.    PLAN OF CARE: Discharge to home after PACU  PATIENT DISPOSITION:  PACU - hemodynamically stable.   Delay start of Pharmacological VTE agent (>24hrs) due to surgical blood loss or risk of bleeding: not applicable

## 2021-03-13 NOTE — Transfer of Care (Signed)
Immediate Anesthesia Transfer of Care Note  Patient: [name]  Procedure(s) Performed: EXCISION SCALP SEBACEOUS CYSTS x2 (Head)  Patient Location: PACU  Anesthesia Type:MAC  Level of Consciousness: awake, alert  and oriented  Airway & Oxygen Therapy: Patient Spontanous Breathing  Post-op Assessment: Report given to RN and Post -op Vital signs reviewed and stable  Post vital signs: Reviewed and stable  Last Vitals:  Vitals Value Taken Time  BP 88/57 03/13/21 1145  Temp    Pulse 98 03/13/21 1147  Resp 18 03/13/21 1147  SpO2 96 % 03/13/21 1147  Vitals shown include unvalidated device data.  Last Pain:  Vitals:   03/13/21 0921  TempSrc:   PainSc: 0-No pain      Patients Stated Pain Goal: 0 (38/88/28 0034)  Complications: No notable events documented.

## 2021-03-13 NOTE — Anesthesia Postprocedure Evaluation (Signed)
Anesthesia Post Note  Patient: [name]  Procedure(s) Performed: EXCISION SCALP SEBACEOUS CYSTS x2 (Head)     Patient location during evaluation: PACU Anesthesia Type: MAC Level of consciousness: awake and alert Pain management: pain level controlled Vital Signs Assessment: post-procedure vital signs reviewed and stable Respiratory status: spontaneous breathing, nonlabored ventilation, respiratory function stable and patient connected to nasal cannula oxygen Cardiovascular status: stable and blood pressure returned to baseline Postop Assessment: no apparent nausea or vomiting Anesthetic complications: no   No notable events documented.  Last Vitals:  Vitals:   03/13/21 1200 03/13/21 1215  BP: 126/87 126/76  Pulse: 96 98  Resp: 12 14  Temp:  37.3 C  SpO2: 96% 95%    Last Pain:  Vitals:   03/13/21 1215  TempSrc:   PainSc: 0-No pain                 Belenda Cruise P Valina Maes

## 2021-03-14 ENCOUNTER — Encounter: Payer: Self-pay | Admitting: Endocrinology

## 2021-03-14 ENCOUNTER — Encounter (HOSPITAL_COMMUNITY): Payer: Self-pay | Admitting: General Surgery

## 2021-03-14 LAB — SURGICAL PATHOLOGY

## 2021-03-29 ENCOUNTER — Ambulatory Visit (INDEPENDENT_AMBULATORY_CARE_PROVIDER_SITE_OTHER): Payer: Medicare Other | Admitting: Endocrinology

## 2021-03-29 ENCOUNTER — Other Ambulatory Visit: Payer: Self-pay

## 2021-03-29 VITALS — BP 120/70 | HR 99 | Ht 65.0 in | Wt 350.0 lb

## 2021-03-29 DIAGNOSIS — E1122 Type 2 diabetes mellitus with diabetic chronic kidney disease: Secondary | ICD-10-CM

## 2021-03-29 DIAGNOSIS — N181 Chronic kidney disease, stage 1: Secondary | ICD-10-CM

## 2021-03-29 MED ORDER — HUMULIN N KWIKPEN 100 UNIT/ML ~~LOC~~ SUPN: 55.0000 [IU] | PEN_INJECTOR | SUBCUTANEOUS | 3 refills | Status: DC

## 2021-03-29 NOTE — Patient Instructions (Addendum)
Please continue the same Trulicity, and:  I have sent a prescription to your pharmacy, to increase the insulin again.   check your blood sugar twice a day.  vary the time of day when you check, between before the 3 meals, and at bedtime.  also check if you have symptoms of your blood sugar being too high or too low.  please keep a record of the readings and bring it to your next appointment here (or you can bring the meter itself).  You can write it on any piece of paper.  please call us sooner if your blood sugar goes below 70, or if you have a lot of readings over 200.   Please come back for a follow-up appointment in 1 month.

## 2021-03-29 NOTE — Progress Notes (Signed)
Subjective:    Patient ID: [name], female    DOB: January 02, 1956, 66 y.o.   MRN: [id number]  HPI Pt returns for f/u of postsurgical hypothyroidism (she had thyroidectomy in approx 1988, for a goiter; she says no cancer was found; she has been on prescribed thyroid hormone therapy since then; she has not recently had thyroid imaging; she does not know why TSH fluctuates, but med noncompliance has been noted; she takes synthroid as rx'ed; MRI (2018) showed normal orbits, and no mention was made of the pituitary).  Pt says synthroid dosage is 200 mcg/d.   Pt also returns for f/u of diabetes mellitus:  DM type: Insulin-requiring type 2.   Dx'ed: 2015.  Complications: stage 3 CRI, PN, and TIA.  Therapy: insulin since 2706, and Trulicity.    GDM: never.  DKA: never Severe hypoglycemia: never.   Pancreatitis: never Pancreatic imaging: Fatty atrophy (2018 CT).   SDOH: she lives alone, despite schizophrenia.   Other: she did not tolerate Invokana (sob); She declined to continue multiple daily injections; she takes NPH, due to pattern of cbg's.  Interval history: pt does not have continuous glucose monitor reader with her today.  no cbg record, but states cbg's vary from 124-210.   Pt also has hypoparathyroidism.  She takes high-dose Vit-D and rocaltrol.   Past Medical History:  Diagnosis Date   Anemia    Anxiety    Arthritis    "pretty much all my joints"   Asthma    Benign paroxysmal positional vertigo 12/30/2012   Breast cancer ([place]) 02/13/12   ruq  100'clock bx Ductal Carcinoma in Situ,(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   Breast mass in female    L breast 2008, US showed likely fat necrosis   Chronic lower back pain    CKD (chronic kidney disease), stage III ([place])    "lower stage" (01/06/2014)   Daily headache    last time was Sep 08, 2016 (brother passed away)   Depression    Dyspnea    Gait abnormality 05/29/2016   GERD (gastroesophageal reflux disease)    History of blood transfusion 2011    "after one of my OR's"   History of hiatal hernia    History of stomach ulcers    HSV (herpes simplex virus) infection    Hx of radiation therapy 05/05/12- 07/15/12   right breast, 45 gray x 25 fx, lumpectomy cavity boosted to 16.2 gray   Hyperlipemia    Hypertension    sees Dr. 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   partial   benign cyst  Left    removed from left breast   BREAST BIOPSY Right 02/13/2012   BREAST LUMPECTOMY  03/03/2012   Procedure: LUMPECTOMY;  Surgeon: Haywood Lasso, MD;  Location:  [facility];  Service: General;  Laterality: Right;   BREAST SURGERY Right 01/2012   "cancer"   CHOLECYSTECTOMY  1980's?   COLONOSCOPY WITH PROPOFOL N/A 09/28/2012   Procedure: COLONOSCOPY WITH PROPOFOL;  Surgeon: Lear Ng, MD;  Location: [facility];  Service: Endoscopy;  Laterality: N/A;   FOOT FRACTURE SURGERY Right 1990's   JOINT REPLACEMENT      x 3   LESION EXCISION N/A 03/13/2021   Procedure: EXCISION SCALP SEBACEOUS CYSTS x2;  Surgeon: Ralene Ok, MD;  Location: [facility];  Service: General;  Laterality: N/A;  60 MINUTES LOCAL & MAC   LUMBAR LAMINECTOMY/DECOMPRESSION MICRODISCECTOMY N/A 12/05/2016   Procedure: LEFT L5-S1 MICRODISCECTOMY;  Surgeon: Marybelle Killings, MD;  Location: [facility];  Service: Orthopedics;  Laterality: N/A;   REVISION TOTAL KNEE ARTHROPLASTY  07/2009   SHOULDER OPEN ROTATOR CUFF REPAIR Left  1990's   THYROIDECTOMY  1970's   TOTAL KNEE ARTHROPLASTY Bilateral 2009-06/2009   left; right    Social History   Socioeconomic History   Marital status: Single    Spouse name: Not on file   Number of children: 2   Years of education: 12   Highest education level: Not on file  Occupational History   Occupation: Disabled  Tobacco Use   Smoking status: Former    Packs/day: 1.00    Years: 7.00    Pack years: 7.00    Types: Cigarettes    Quit date: 03/24/1992    Years since quitting: 29.0   Smokeless tobacco: Never  Vaping Use   Vaping Use: Never used  Substance and Sexual Activity   Alcohol use: No    Comment: "stopped drinking in 1996; I was an alcoholic"   Drug use: No   Sexual activity: Never    Birth control/protection: Surgical  Other Topics Concern   Not on file  Social History Narrative   Single. Disabled Biochemist, clinical.          Patient is right handed.    Denies caffeine use    Social Determinants of Radio broadcast assistant Strain: Not on file  Food Insecurity: Not on file  Transportation Needs: Not on file  Physical Activity: Not on file  Stress: Not on file  Social Connections: Not on file  Intimate Partner Violence: Not on file    Current Outpatient Medications on File Prior to Visit  Medication Sig Dispense Refill   acetaminophen (TYLENOL) 650 MG CR tablet Take 1,300 mg by mouth every 8 (eight) hours as needed for pain.     albuterol (VENTOLIN HFA) 108 (90 Base) MCG/ACT inhaler Inhale 2 puffs into the lungs every 4 (four) hours as needed for wheezing or shortness of breath.      amLODipine (NORVASC) 10 MG tablet Take 1 tablet (10 mg total) by mouth daily. 30 tablet 1   Ascorbic Acid (VITAMIN C PO) Take 1 tablet by mouth in the morning.     aspirin EC 81 MG tablet Take 1 tablet (81 mg total) by mouth daily. 30 tablet 0   BELBUCA 300 MCG FILM Take 300 mcg by mouth 2 (two) times daily.     belladonna-opium (B&O SUPPRETTES) 16.2-30 MG suppository  Place 1 suppository rectally 2 (two) times daily as needed (for bladder spasms). 10 suppository 0   calcitRIOL (ROCALTROL) 0.25 MCG capsule Take 1 capsule (0.25 mcg total) by mouth 3 (three) times a week. Taken Monday, Wednesday, Friday 12 capsule 3   Cholecalciferol (VITAMIN D-3) 1000 units  CAPS Take 1,000 Units by mouth daily.     Cyanocobalamin (VITAMIN B-12 PO) Take 1 tablet by mouth daily.     cyclobenzaprine (FLEXERIL) 10 MG tablet Take 10 mg by mouth at bedtime.     doxepin (SINEQUAN) 50 MG capsule Take 100 mg by mouth at bedtime.     esomeprazole (NEXIUM) 40 MG capsule Take 1 capsule (40 mg total) by mouth daily before breakfast. 30 capsule 1   famotidine (PEPCID) 40 MG tablet Take 40 mg by mouth daily after supper.     ferrous sulfate 325 (65 FE) MG tablet Take 325 mg by mouth in the morning.     furosemide (LASIX) 20 MG tablet Take 2 tablets (40 mg total) by mouth daily. (Patient taking differently: Take 60 mg by mouth in the morning.) 90 tablet 0   Lancets (ONETOUCH DELICA PLUS [id number]) MISC 1 each by Other route 3 (three) times daily. Use to check blood sugar 3 times a day 300 each 11   levothyroxine (SYNTHROID) 200 MCG tablet Take 1 tablet (200 mcg total) by mouth daily before breakfast. 90 tablet 3   linaclotide (LINZESS) 290 MCG CAPS capsule Take 290 mcg by mouth daily before breakfast.     LORazepam (ATIVAN) 0.5 MG tablet Take 1 tablet (0.5 mg total) by mouth every 8 (eight) hours as needed for anxiety. 6 tablet 0   LORazepam (ATIVAN) 1 MG tablet Take 1 mg by mouth in the morning, at noon, and at bedtime.     losartan-hydrochlorothiazide (HYZAAR) 100-25 MG tablet Take 1 tablet by mouth in the morning.     lubiprostone (AMITIZA) 24 MCG capsule Take 24 mcg by mouth 2 (two) times daily.     methocarbamol (ROBAXIN) 750 MG tablet Take 750 mg by mouth 3 (three) times daily.     mirabegron ER (MYRBETRIQ) 25 MG TB24 tablet Take 25 mg by mouth in the morning.     naloxone (NARCAN) nasal  spray 4 mg/0.1 mL Place 1 spray into the nose as needed for opioid reversal.     ONETOUCH VERIO test strip TEST BLOOD SUGAR THREE TIMES DAILY 100 strip 11   penicillin v potassium (VEETID) 500 MG tablet Take 500 mg by mouth 4 (four) times daily.     polyvinyl alcohol (LIQUIFILM TEARS) 1.4 % ophthalmic solution 1 drop as needed for dry eyes.     potassium chloride SA (KLOR-CON) 20 MEQ tablet Take 20 mEq by mouth daily.     pregabalin (LYRICA) 300 MG capsule Take 300 mg by mouth 2 (two) times daily.     rosuvastatin (CRESTOR) 20 MG tablet Take 20 mg by mouth daily.     sucralfate (CARAFATE) 1 g tablet Take 1 g by mouth in the morning.     SURE COMFORT PEN NEEDLES 32G X 6 MM MISC USE TO INJECT INSULIN FOUR TIMES DAILY 200 each 1   tolterodine (DETROL LA) 2 MG 24 hr capsule Take 2 mg by mouth every evening.     traMADol (ULTRAM) 50 MG tablet Take 1 tablet (50 mg total) by mouth every 6 (six) hours as needed. 20 tablet 0   TRULICITY 4.5 YB/0.1BP SOPN Inject 4.5 mg into the skin once a week. (Patient taking differently: Inject 4.5 mg into the skin every Saturday.) 6 mL 3   valACYclovir (VALTREX) 1000 MG tablet Take 1,000 mg by mouth daily.     venlafaxine XR (EFFEXOR-XR) 150 MG 24 hr capsule Take 150 mg by mouth 2 (two) times  daily.     Vitamin D, Ergocalciferol, (DRISDOL) 1.25 MG (50000 UNIT) CAPS capsule Take 50,000 Units by mouth every Saturday.     ziprasidone (GEODON) 60 MG capsule Take 60 mg by mouth daily after supper.     No current facility-administered medications on file prior to visit.    Allergies  Allergen Reactions   Bee Venom Anaphylaxis   Invokana [Canagliflozin] Anaphylaxis    Dyspnea and urinary retention   Shellfish Allergy Anaphylaxis   Buprenorphine Hcl Other (See Comments)    Overly sedated with morphine drip.   Other Other (See Comments)    Cats - bad asthma, stopped up    Family History  Problem Relation Age of Onset   Heart disease Brother        Multiple MIs,  starting in his 67s   Diabetes Brother    Thyroid disease Brother    Hypertension Mother    Diabetes Mother    Breast cancer Mother 15   Bone cancer Mother    Hypertension Sister    Diabetes Sister    Breast cancer Sister 1   Thyroid disease Sister    Breast cancer Maternal Grandmother    Heart disease Maternal Grandmother    Uterine cancer Other 19   Breast cancer Paternal Aunt 57   Breast cancer Paternal Grandmother        dx in her 16s   Prostate cancer Paternal Grandfather    Asthma Son     BP 120/70    Pulse 99    Ht 5' 5" (1.651 m)    Wt (!) 350 lb (158.8 kg)    SpO2 95%    BMI 58.24 kg/m   Review of Systems She denies hypoglycemia.      Objective:   Physical Exam VITAL SIGNS:  See vs page GENERAL: no distress.  In [place].     Lab Results  Component Value Date   TSH 5.13 02/26/2021   Lab Results  Component Value Date   HGBA1C 10.4 (A) 02/26/2021   Lab Results  Component Value Date   PTH 38 05/08/2020   CALCIUM 9.4 03/12/2021   CAION 1.21 09/30/2020   PHOS 3.7 10/05/2020   Lab Results  Component Value Date   CREATININE 1.15 (H) 03/12/2021   BUN 25 (H) 03/12/2021   NA 133 (L) 03/12/2021   K 3.7 03/12/2021   CL 96 (L) 03/12/2021   CO2 26 03/12/2021      Assessment & Plan:  Insulin-requiring type 2 DM: uncontrolled  Patient Instructions  Please continue the same Trulicity, and:  I have sent a prescription to your pharmacy, to increase the insulin again.   check your blood sugar twice a day.  vary the time of day when you check, between before the 3 meals, and at bedtime.  also check if you have symptoms of your blood sugar being too high or too low.  please keep a record of the readings and bring it to your next appointment here (or you can bring the meter itself).  You can write it on any piece of paper.  please call us sooner if your blood sugar goes below 70, or if you have a lot of readings over 200.   Please come back for a follow-up appointment in  1 month.

## 2021-03-30 ENCOUNTER — Other Ambulatory Visit: Payer: Self-pay | Admitting: Endocrinology

## 2021-03-30 DIAGNOSIS — N2581 Secondary hyperparathyroidism of renal origin: Secondary | ICD-10-CM

## 2021-04-26 ENCOUNTER — Ambulatory Visit (INDEPENDENT_AMBULATORY_CARE_PROVIDER_SITE_OTHER): Payer: Medicare Other | Admitting: Endocrinology

## 2021-04-26 ENCOUNTER — Other Ambulatory Visit: Payer: Self-pay

## 2021-04-26 VITALS — BP 100/82 | HR 94 | Ht 65.0 in

## 2021-04-26 DIAGNOSIS — N181 Chronic kidney disease, stage 1: Secondary | ICD-10-CM

## 2021-04-26 DIAGNOSIS — E1122 Type 2 diabetes mellitus with diabetic chronic kidney disease: Secondary | ICD-10-CM | POA: Diagnosis not present

## 2021-04-26 LAB — VITAMIN D 25 HYDROXY (VIT D DEFICIENCY, FRACTURES): VITD: 73.07 ng/mL (ref 30.00–100.00)

## 2021-04-26 LAB — POCT GLYCOSYLATED HEMOGLOBIN (HGB A1C): Hemoglobin A1C: 9.6 % — AB (ref 4.0–5.6)

## 2021-04-26 MED ORDER — HUMULIN N KWIKPEN 100 UNIT/ML ~~LOC~~ SUPN: 55.0000 [IU] | PEN_INJECTOR | SUBCUTANEOUS | 3 refills | Status: DC

## 2021-04-26 NOTE — Patient Instructions (Addendum)
Please continue the same Trulicity, and:  ?I have resent the prescription to your pharmacy, to increase the insulin to 55 units each morning.   ?check your blood sugar twice a day.  vary the time of day when you check, between before the 3 meals, and at bedtime.  also check if you have symptoms of your blood sugar being too high or too low.  please keep a record of the readings and bring it to your next appointment here (or you can bring the meter itself).  You can write it on any piece of paper.  please call us sooner if your blood sugar goes below 70, or if you have a lot of readings over 200.   ?Please come back for a follow-up appointment in 2 months.   ? ?

## 2021-04-26 NOTE — Progress Notes (Signed)
Subjective:    Patient ID: [name], female    DOB: 04/17/1955, 66 y.o.   MRN: [id number]  HPI Pt returns for f/u of postsurgical hypothyroidism (she had thyroidectomy in approx 1988, for a goiter; she says no cancer was found; she has been on prescribed thyroid hormone therapy since then; she has not recently had thyroid imaging; she does not know why TSH fluctuates, but med noncompliance has been noted; she takes synthroid as rx'ed; MRI (2018) showed normal orbits, and no mention was made of the pituitary).  Pt says synthroid dosage is 200 mcg/d.   Pt also returns for f/u of diabetes mellitus:  DM type: Insulin-requiring type 2.   Dx'ed: 2015.  Complications: stage 3 CRI, PN, and TIA.  Therapy: insulin since 2820, and Trulicity.    GDM: never.  DKA: never Severe hypoglycemia: never.   Pancreatitis: never Pancreatic imaging: Fatty atrophy (2018 CT).   SDOH: she lives alone, despite schizophrenia.   Other: she did not tolerate Invokana (sob); She declined to continue multiple daily injections; she takes NPH, due to pattern of cbg's.  Interval history: pt again does not have continuous glucose monitor reader with her today.  no cbg record, but states cbg's vary from 123-200.  There is no trend throughout the day.  She takes just 45 units qam.   Pt also has hypoparathyroidism.  She takes high-dose Vit-D and rocaltrol.   Past Medical History:  Diagnosis Date   Anemia    Anxiety    Arthritis    "pretty much all my joints"   Asthma    Benign paroxysmal positional vertigo 12/30/2012   Breast cancer ([place]) 02/13/12   ruq  100'clock bx Ductal Carcinoma in Situ,(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   Breast mass in female    L breast 2008, US showed likely fat necrosis   Chronic lower back pain    CKD (chronic kidney disease), stage III ([place])    "lower stage" (01/06/2014)   Daily headache    last time was 09/05/16 (brother passed away)   Depression    Dyspnea    Gait abnormality 05/29/2016    GERD (gastroesophageal reflux disease)    History of blood transfusion 2011   "after one of my OR's"   History of hiatal hernia    History of stomach ulcers    HSV (herpes simplex virus) infection    Hx of radiation therapy 05/05/12- 07/15/12   right breast, 45 gray x 25 fx, lumpectomy cavity boosted to 16.2 gray   Hyperlipemia    Hypertension    sees Dr. 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   partial   benign cyst  Left    removed from left breast   BREAST BIOPSY Right 02/13/2012  BREAST LUMPECTOMY  03/03/2012   Procedure: LUMPECTOMY;  Surgeon: Haywood Lasso, MD;  Location: [facility];  Service: General;  Laterality: Right;   BREAST SURGERY Right 01/2012   "cancer"   CHOLECYSTECTOMY  1980's?   COLONOSCOPY WITH PROPOFOL N/A 09/28/2012   Procedure: COLONOSCOPY WITH PROPOFOL;  Surgeon: Lear Ng, MD;  [facility];  Service: Endoscopy;  Laterality: N/A;   FOOT FRACTURE SURGERY Right 1990's   JOINT REPLACEMENT      x 3   LESION EXCISION N/A 03/13/2021   Procedure: EXCISION SCALP SEBACEOUS CYSTS x2;  Surgeon: Ralene Ok, MD;  Location: [facility];  Service: General;  Laterality: N/A;  60 MINUTES LOCAL & MAC   LUMBAR LAMINECTOMY/DECOMPRESSION MICRODISCECTOMY N/A 12/05/2016   Procedure: LEFT L5-S1 MICRODISCECTOMY;  Surgeon: Marybelle Killings, MD;  Location: [facility];  Service: Orthopedics;  Laterality: N/A;   REVISION  TOTAL KNEE ARTHROPLASTY  07/2009   SHOULDER OPEN ROTATOR CUFF REPAIR Left 1990's   THYROIDECTOMY  1970's   TOTAL KNEE ARTHROPLASTY Bilateral 2009-06/2009   left; right    Social History   Socioeconomic History   Marital status: Single    Spouse name: Not on file   Number of children: 2   Years of education: 12   Highest education level: Not on file  Occupational History   Occupation: Disabled  Tobacco Use   Smoking status: Former    Packs/day: 1.00    Years: 7.00    Pack years: 7.00    Types: Cigarettes    Quit date: 03/24/1992    Years since quitting: 29.1   Smokeless tobacco: Never  Vaping Use   Vaping Use: Never used  Substance and Sexual Activity   Alcohol use: No    Comment: "stopped drinking in 1996; I was an alcoholic"   Drug use: No   Sexual activity: Never    Birth control/protection: Surgical  Other Topics Concern   Not on file  Social History Narrative   Single. Disabled Biochemist, clinical.          Patient is right handed.    Denies caffeine use    Social Determinants of Radio broadcast assistant Strain: Not on file  Food Insecurity: Not on file  Transportation Needs: Not on file  Physical Activity: Not on file  Stress: Not on file  Social Connections: Not on file  Intimate Partner Violence: Not on file    Current Outpatient Medications on File Prior to Visit  Medication Sig Dispense Refill   acetaminophen (TYLENOL) 650 MG CR tablet Take 1,300 mg by mouth every 8 (eight) hours as needed for pain.     albuterol (VENTOLIN HFA) 108 (90 Base) MCG/ACT inhaler Inhale 2 puffs into the lungs every 4 (four) hours as needed for wheezing or shortness of breath.      amLODipine (NORVASC) 10 MG tablet Take 1 tablet (10 mg total) by mouth daily. 30 tablet 1   Ascorbic Acid (VITAMIN C PO) Take 1 tablet by mouth in the morning.     aspirin EC 81 MG tablet Take 1 tablet (81 mg total) by mouth daily. 30 tablet 0   BELBUCA 300 MCG FILM Take 300 mcg by mouth 2 (two)  times daily.     belladonna-opium (B&O SUPPRETTES) 16.2-30 MG suppository Place 1 suppository rectally 2 (two) times daily as needed (for bladder spasms). 10 suppository 0   calcitRIOL (ROCALTROL) 0.25 MCG capsule Take 1 capsule (0.25 mcg total) by mouth 3 (three) times  a week. Taken Monday, Wednesday, Friday 12 capsule 3   Cholecalciferol (VITAMIN D-3) 1000 units CAPS Take 1,000 Units by mouth daily.     Cyanocobalamin (VITAMIN B-12 PO) Take 1 tablet by mouth daily.     cyclobenzaprine (FLEXERIL) 10 MG tablet Take 10 mg by mouth at bedtime.     doxepin (SINEQUAN) 50 MG capsule Take 100 mg by mouth at bedtime.     esomeprazole (NEXIUM) 40 MG capsule Take 1 capsule (40 mg total) by mouth daily before breakfast. 30 capsule 1   famotidine (PEPCID) 40 MG tablet Take 40 mg by mouth daily after supper.     ferrous sulfate 325 (65 FE) MG tablet Take 325 mg by mouth in the morning.     furosemide (LASIX) 20 MG tablet Take 2 tablets (40 mg total) by mouth daily. (Patient taking differently: Take 60 mg by mouth in the morning.) 90 tablet 0   Lancets (ONETOUCH DELICA PLUS [id number]) MISC 1 each by Other route 3 (three) times daily. Use to check blood sugar 3 times a day 300 each 11   levothyroxine (SYNTHROID) 200 MCG tablet Take 1 tablet (200 mcg total) by mouth daily before breakfast. 90 tablet 3   linaclotide (LINZESS) 290 MCG CAPS capsule Take 290 mcg by mouth daily before breakfast.     LORazepam (ATIVAN) 0.5 MG tablet Take 1 tablet (0.5 mg total) by mouth every 8 (eight) hours as needed for anxiety. 6 tablet 0   LORazepam (ATIVAN) 1 MG tablet Take 1 mg by mouth in the morning, at noon, and at bedtime.     losartan-hydrochlorothiazide (HYZAAR) 100-25 MG tablet Take 1 tablet by mouth in the morning.     lubiprostone (AMITIZA) 24 MCG capsule Take 24 mcg by mouth 2 (two) times daily.     methocarbamol (ROBAXIN) 750 MG tablet Take 750 mg by mouth 3 (three) times daily.     mirabegron ER (MYRBETRIQ) 25 MG  TB24 tablet Take 25 mg by mouth in the morning.     naloxone (NARCAN) nasal spray 4 mg/0.1 mL Place 1 spray into the nose as needed for opioid reversal.     ONETOUCH VERIO test strip TEST BLOOD SUGAR THREE TIMES DAILY 100 strip 11   penicillin v potassium (VEETID) 500 MG tablet Take 500 mg by mouth 4 (four) times daily.     polyvinyl alcohol (LIQUIFILM TEARS) 1.4 % ophthalmic solution 1 drop as needed for dry eyes.     potassium chloride SA (KLOR-CON) 20 MEQ tablet Take 20 mEq by mouth daily.     pregabalin (LYRICA) 300 MG capsule Take 300 mg by mouth 2 (two) times daily.     rosuvastatin (CRESTOR) 20 MG tablet Take 20 mg by mouth daily.     sucralfate (CARAFATE) 1 g tablet Take 1 g by mouth in the morning.     SURE COMFORT PEN NEEDLES 32G X 6 MM MISC USE TO INJECT INSULIN FOUR TIMES DAILY 200 each 1   tolterodine (DETROL LA) 2 MG 24 hr capsule Take 2 mg by mouth every evening.     traMADol (ULTRAM) 50 MG tablet Take 1 tablet (50 mg total) by mouth every 6 (six) hours as needed. 20 tablet 0   TRULICITY 4.5 TE/7.0RA SOPN Inject 4.5 mg into the skin once a week. (Patient taking differently: Inject 4.5 mg into the skin every Saturday.) 6 mL 3   valACYclovir (VALTREX) 1000 MG tablet Take 1,000 mg by mouth daily.  venlafaxine XR (EFFEXOR-XR) 150 MG 24 hr capsule Take 150 mg by mouth 2 (two) times daily.     Vitamin D, Ergocalciferol, (DRISDOL) 1.25 MG (50000 UNIT) CAPS capsule Take 50,000 Units by mouth every Saturday.     ziprasidone (GEODON) 60 MG capsule Take 60 mg by mouth daily after supper.     No current facility-administered medications on file prior to visit.    Allergies  Allergen Reactions   Bee Venom Anaphylaxis   Invokana [Canagliflozin] Anaphylaxis    Dyspnea and urinary retention   Shellfish Allergy Anaphylaxis   Buprenorphine Hcl Other (See Comments)    Overly sedated with morphine drip.   Other Other (See Comments)    Cats - bad asthma, stopped up    Family History   Problem Relation Age of Onset   Heart disease Brother        Multiple MIs, starting in his 13s   Diabetes Brother    Thyroid disease Brother    Hypertension Mother    Diabetes Mother    Breast cancer Mother 8   Bone cancer Mother    Hypertension Sister    Diabetes Sister    Breast cancer Sister 17   Thyroid disease Sister    Breast cancer Maternal Grandmother    Heart disease Maternal Grandmother    Uterine cancer Other 19   Breast cancer Paternal Aunt 60   Breast cancer Paternal Grandmother        dx in her 43s   Prostate cancer Paternal Grandfather    Asthma Son     BP 100/82    Pulse 94    Ht _0  (1.651 m)    SpO2 92%    BMI 58.24 kg/m    Review of Systems She denies hypoglycemia.     Objective:   Physical Exam  Lab Results  Component Value Date   TSH 5.13 02/26/2021    Lab Results  Component Value Date   HGBA1C 9.6 (A) 04/26/2021      Assessment & Plan:  Insulin-requiring type 2 DM: uncontrolled Hypoparathyroidism: recheck today  Patient Instructions  Please continue the same Trulicity, and:  I have resent the prescription to your pharmacy, to increase the insulin to 55 units each morning.   check your blood sugar twice a day.  vary the time of day when you check, between before the 3 meals, and at bedtime.  also check if you have symptoms of your blood sugar being too high or too low.  please keep a record of the readings and bring it to your next appointment here (or you can bring the meter itself).  You can write it on any piece of paper.  please call us sooner if your blood sugar goes below 70, or if you have a lot of readings over 200.   Please come back for a follow-up appointment in 2 months.

## 2021-04-29 ENCOUNTER — Other Ambulatory Visit: Payer: Self-pay | Admitting: Endocrinology

## 2021-04-29 LAB — PTH, INTACT AND CALCIUM
Calcium: 9.9 mg/dL (ref 8.6–10.4)
PTH: 36 pg/mL (ref 16–77)

## 2021-04-29 MED ORDER — VITAMIN D (ERGOCALCIFEROL) 1.25 MG (50000 UNIT) PO CAPS: 50000.0000 [IU] | ORAL_CAPSULE | ORAL | 3 refills | Status: DC

## 2021-05-13 ENCOUNTER — Other Ambulatory Visit: Payer: Self-pay

## 2021-05-13 DIAGNOSIS — E118 Type 2 diabetes mellitus with unspecified complications: Secondary | ICD-10-CM

## 2021-05-13 MED ORDER — ONETOUCH DELICA PLUS LANCET33G MISC: 1.0000 | Freq: Three times a day (TID) | 11 refills | Status: DC

## 2021-05-29 ENCOUNTER — Other Ambulatory Visit: Payer: Self-pay | Admitting: Endocrinology

## 2021-05-29 DIAGNOSIS — Z1231 Encounter for screening mammogram for malignant neoplasm of breast: Secondary | ICD-10-CM

## 2021-06-15 ENCOUNTER — Other Ambulatory Visit: Payer: Self-pay | Admitting: Endocrinology

## 2021-06-15 DIAGNOSIS — N2581 Secondary hyperparathyroidism of renal origin: Secondary | ICD-10-CM

## 2021-06-25 ENCOUNTER — Ambulatory Visit: Payer: Medicare Other

## 2021-06-27 ENCOUNTER — Ambulatory Visit: Payer: Medicare Other | Admitting: Endocrinology

## 2021-07-03 ENCOUNTER — Ambulatory Visit
Admission: RE | Admit: 2021-07-03 | Discharge: 2021-07-03 | Disposition: A | Discharge status: Home / Self Care | Payer: Medicare Other | Source: Ambulatory Visit | Attending: Endocrinology | Admitting: Endocrinology

## 2021-07-03 DIAGNOSIS — Z1231 Encounter for screening mammogram for malignant neoplasm of breast: Secondary | ICD-10-CM

## 2021-07-03 DIAGNOSIS — N6310 Unspecified lump in the right breast, unspecified quadrant: Secondary | ICD-10-CM

## 2021-07-04 ENCOUNTER — Other Ambulatory Visit: Payer: Self-pay | Admitting: Endocrinology

## 2021-07-04 DIAGNOSIS — R928 Other abnormal and inconclusive findings on diagnostic imaging of breast: Secondary | ICD-10-CM

## 2021-07-26 ENCOUNTER — Ambulatory Visit
Admission: RE | Admit: 2021-07-26 | Discharge: 2021-07-26 | Disposition: A | Discharge status: Home / Self Care | Payer: Medicare Other | Source: Ambulatory Visit | Attending: Endocrinology | Admitting: Endocrinology

## 2021-07-26 ENCOUNTER — Ambulatory Visit: Payer: Medicare Other

## 2021-07-26 ENCOUNTER — Other Ambulatory Visit: Payer: Self-pay | Admitting: Endocrinology

## 2021-07-26 DIAGNOSIS — R928 Other abnormal and inconclusive findings on diagnostic imaging of breast: Secondary | ICD-10-CM

## 2021-07-26 DIAGNOSIS — N6320 Unspecified lump in the left breast, unspecified quadrant: Secondary | ICD-10-CM

## 2021-08-08 ENCOUNTER — Ambulatory Visit: Payer: Medicare Other | Admitting: Internal Medicine

## 2021-08-08 NOTE — Progress Notes (Deleted)
Name: [name]  Age/ Sex: 66 y.o., female   MRN/ DOB: [id number], [birth year]     PCP: Fredrich Romans, PA   Reason for Endocrinology Evaluation: Type 2 Diabetes Mellitus/ Hypothyroidism   Initial Endocrine Consultative Visit: ***    PATIENT IDENTIFIER: [name] is a 66 y.o. female with a past medical history of ***. The patient has followed with Endocrinology clinic since *** for consultative assistance with management of her diabetes.  DIABETIC HISTORY:  [name] was diagnosed with DM in 2015. Her hemoglobin A1c has ranged from 6.9% in 2016, peaking at 10.4% in 2023.  THYROID HISTORY: She is S/P total thyroidectomy in ~1988 due to benign reasons.  She has been on LT-for replacement for years.   SUBJECTIVE:   During the last visit (04/26/2021): Saw Dr. Loanne Drilling  Today (08/08/2021): [name] is here for follow-up on diabetes management and postoperative hypothyroidism.  She checks her blood sugars *** times daily, preprandial to breakfast and ***. The patient has *** had hypoglycemic episodes since the last clinic visit, which typically occur *** x / - most often occuring ***. The patient is *** symptomatic with these episodes, with symptoms of {symptoms; hypoglycemia:9084048}.   HOME DIABETES REGIMEN:   Trulicity 4.5 mg weekly Vitamin D3 1000 IU daily Ergocalciferol 50,000 IU every 14 days Calcitriol 0.25, 3 caps a week     Statin: Yes ACE-I/ARB: Yes Prior Diabetic Education: ***   METER DOWNLOAD SUMMARY: Date range evaluated: *** Fingerstick Blood Glucose Tests = *** Average Number Tests/Day = *** Overall Mean FS Glucose = *** Standard Deviation = ***  BG Ranges: Low = *** High = ***   Hypoglycemic Events/30 Days: BG < 50 = *** Episodes of symptomatic severe hypoglycemia = ***    DIABETIC COMPLICATIONS: Microvascular complications:  Neuropathy Denies:  Last Eye Exam: Completed   Macrovascular complications:  *** Denies: CAD, CVA,  PVD   HISTORY:  Past Medical History:  Past Medical History:  Diagnosis Date   Anemia    Anxiety    Arthritis    "pretty much all my joints"   Asthma    Benign paroxysmal positional vertigo 12/30/2012   Breast cancer ([place]) 02/13/12   ruq  100'clock bx Ductal Carcinoma in Situ,(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   Breast mass in female    L breast 2008, US showed likely fat necrosis   Chronic lower back pain    CKD (chronic kidney disease), stage III ([place])    "lower stage" (01/06/2014)   Daily headache    last time was 09-15-16 (brother passed away)   Depression    Dyspnea    Gait abnormality 05/29/2016   GERD (gastroesophageal reflux disease)    History of blood transfusion 2011   "after one of my OR's"   History of hiatal hernia    History of stomach ulcers    HSV (herpes simplex virus) infection    Hx of radiation therapy 05/05/12- 07/15/12   right breast, 45 gray x 25 fx, lumpectomy cavity boosted to 16.2 gray   Hyperlipemia    Hypertension    sees Dr. 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   partial   benign cyst  Left    removed from left breast   BREAST BIOPSY Right 02/13/2012   BREAST LUMPECTOMY  03/03/2012   Procedure: LUMPECTOMY;  Surgeon: Haywood Lasso, MD;  Location: [facility];  Service: General;  Laterality: Right;   BREAST SURGERY Right 01/2012   "cancer"   CHOLECYSTECTOMY  1980's?    COLONOSCOPY WITH PROPOFOL N/A 09/28/2012   Procedure: COLONOSCOPY WITH PROPOFOL;  Surgeon: Lear Ng, MD;  Location: [facility];  Service: Endoscopy;  Laterality: N/A;   FOOT FRACTURE SURGERY Right 1990's   JOINT REPLACEMENT      x 3   LESION EXCISION N/A 03/13/2021   Procedure: EXCISION SCALP SEBACEOUS CYSTS x2;  Surgeon: Ralene Ok, MD;  Location: [facility];  Service: General;  Laterality: N/A;  60 MINUTES LOCAL & MAC   LUMBAR LAMINECTOMY/DECOMPRESSION MICRODISCECTOMY N/A 12/05/2016   Procedure: LEFT L5-S1 MICRODISCECTOMY;  Surgeon: Marybelle Killings, MD;  Location: [facility];  Service: Orthopedics;  Laterality: N/A;   REVISION TOTAL KNEE ARTHROPLASTY  07/2009   SHOULDER OPEN ROTATOR CUFF REPAIR Left 1990's   THYROIDECTOMY  1970's   TOTAL KNEE ARTHROPLASTY Bilateral 2009-06/2009   left; right   Social History:  reports that she quit smoking about 29 years ago. Her smoking use included cigarettes. She has a 7.00 pack-year smoking history. She has never used smokeless tobacco. She reports that she does not drink alcohol and does not use drugs. Family History:  Family History  Problem Relation Age of Onset   Heart disease Brother        Multiple MIs, starting in his 76s   Diabetes Brother    Thyroid disease Brother    Hypertension Mother    Diabetes Mother    Breast cancer Mother 29   Bone cancer Mother    Hypertension Sister    Diabetes Sister    Breast cancer Sister 67   Thyroid disease Sister    Breast cancer Maternal Grandmother    Heart disease Maternal Grandmother    Uterine cancer Other 19   Breast cancer Paternal Aunt 67   Breast cancer Paternal Grandmother        dx in her 22s   Prostate cancer Paternal Grandfather    Asthma Son      HOME MEDICATIONS: Allergies as of 08/08/2021       Reactions   Bee Venom Anaphylaxis   Invokana [canagliflozin] Anaphylaxis   Dyspnea and urinary retention   Shellfish Allergy Anaphylaxis   Buprenorphine Hcl Other (See  Comments)   Overly sedated with morphine drip.   Other Other (See Comments)   Cats - bad asthma, stopped up        Medication List        Accurate as of August 08, 2021  7:18 AM. If you have any questions, ask your nurse or doctor.          acetaminophen 650 MG CR tablet Commonly known as: TYLENOL Take 1,300 mg by mouth every 8 (eight) hours as needed for pain.   albuterol 108 (90  Base) MCG/ACT inhaler Commonly known as: VENTOLIN HFA Inhale 2 puffs into the lungs every 4 (four) hours as needed for wheezing or shortness of breath.   amLODipine 10 MG tablet Commonly known as: NORVASC Take 1 tablet (10 mg total) by mouth daily.   aspirin EC 81 MG tablet Take 1 tablet (81 mg total) by mouth daily.   Belbuca 300 MCG Film Generic drug: Buprenorphine HCl Take 300 mcg by mouth 2 (two) times daily.   belladonna-opium 16.2-30 MG suppository Commonly known as: B&O SUPPRETTES Place 1 suppository rectally 2 (two) times daily as needed (for bladder spasms).   calcitRIOL 0.25 MCG capsule Commonly known as: ROCALTROL TAKE ONE CAPSULE BY MOUTH three times PER WEEK   cyclobenzaprine 10 MG tablet Commonly known as: FLEXERIL Take 10 mg by mouth at bedtime.   doxepin 50 MG capsule Commonly known as: SINEQUAN Take 100 mg by mouth at bedtime.   esomeprazole 40 MG capsule Commonly known as: NEXIUM Take 1 capsule (40 mg total) by mouth daily before breakfast.   famotidine 40 MG tablet Commonly known as: PEPCID Take 40 mg by mouth daily after supper.   ferrous sulfate 325 (65 FE) MG tablet Take 325 mg by mouth in the morning.   furosemide 20 MG tablet Commonly known as: LASIX Take 2 tablets (40 mg total) by mouth daily. What changed:  how much to take when to take this   HumuLIN N KwikPen 100 UNIT/ML Kiwkpen Generic drug: Insulin NPH (Human) (Isophane) Inject 55 Units into the skin every morning. And pen needles 1/day   levothyroxine 200 MCG tablet Commonly known as:  SYNTHROID Take 1 tablet (200 mcg total) by mouth daily before breakfast.   linaclotide 290 MCG Caps capsule Commonly known as: LINZESS Take 290 mcg by mouth daily before breakfast.   LORazepam 1 MG tablet Commonly known as: ATIVAN Take 1 mg by mouth in the morning, at noon, and at bedtime.   LORazepam 0.5 MG tablet Commonly known as: ATIVAN Take 1 tablet (0.5 mg total) by mouth every 8 (eight) hours as needed for anxiety.   losartan-hydrochlorothiazide 100-25 MG tablet Commonly known as: HYZAAR Take 1 tablet by mouth in the morning.   lubiprostone 24 MCG capsule Commonly known as: AMITIZA Take 24 mcg by mouth 2 (two) times daily.   methocarbamol 750 MG tablet Commonly known as: ROBAXIN Take 750 mg by mouth 3 (three) times daily.   mirabegron ER 25 MG Tb24 tablet Commonly known as: MYRBETRIQ Take 25 mg by mouth in the morning.   naloxone 4 MG/0.1ML Liqd nasal spray kit Commonly known as: NARCAN Place 1 spray into the nose as needed for opioid reversal.   OneTouch Delica Plus [id number] Misc 1 each by Other route 3 (three) times daily. Use to check blood sugar 3 times a day   OneTouch Verio test strip Generic drug: glucose blood TEST BLOOD SUGAR THREE TIMES DAILY   penicillin v potassium 500 MG tablet Commonly known as: VEETID Take 500 mg by mouth 4 (four) times daily.   polyvinyl alcohol 1.4 % ophthalmic solution Commonly known as: LIQUIFILM TEARS 1 drop as needed for dry eyes.   potassium chloride SA 20 MEQ tablet Commonly known as: KLOR-CON M Take 20 mEq by mouth daily.   pregabalin 300 MG capsule Commonly known as: LYRICA Take 300 mg by mouth 2 (two) times daily.   rosuvastatin 20 MG tablet Commonly known as: CRESTOR Take 20 mg by mouth daily.   sucralfate 1 g  tablet Commonly known as: CARAFATE Take 1 g by mouth in the morning.   Sure Comfort Pen Needles 32G X 6 MM Misc Generic drug: Insulin Pen Needle USE TO INJECT INSULIN FOUR TIMES DAILY    tolterodine 2 MG 24 hr capsule Commonly known as: DETROL LA Take 2 mg by mouth every evening.   traMADol 50 MG tablet Commonly known as: Ultram Take 1 tablet (50 mg total) by mouth every 6 (six) hours as needed.   Trulicity 4.5 VQ/0.0QQ Sopn Generic drug: Dulaglutide Inject 4.5 mg into the skin once a week. What changed: when to take this   valACYclovir 1000 MG tablet Commonly known as: VALTREX Take 1,000 mg by mouth daily.   venlafaxine XR 150 MG 24 hr capsule Commonly known as: EFFEXOR-XR Take 150 mg by mouth 2 (two) times daily.   VITAMIN B-12 PO Take 1 tablet by mouth daily.   VITAMIN C PO Take 1 tablet by mouth in the morning.   Vitamin D (Ergocalciferol) 1.25 MG (50000 UNIT) Caps capsule Commonly known as: DRISDOL Take 1 capsule (50,000 Units total) by mouth every 14 (fourteen) days.   Vitamin D-3 25 MCG (1000 UT) Caps Take 1,000 Units by mouth daily.   ziprasidone 60 MG capsule Commonly known as: GEODON Take 60 mg by mouth daily after supper.         OBJECTIVE:   Vital Signs: There were no vitals taken for this visit.  Wt Readings from Last 3 Encounters:  03/29/21 (!) 350 lb (158.8 kg)  03/13/21 299 lb 13.2 oz (136 kg)  11/13/20 300 lb (136.1 kg)     Exam: General: Pt appears well and is in NAD  Hydration: Well-hydrated with moist mucous membranes and good skin turgor  HEENT: Head: Unremarkable with good dentition. Oropharynx clear without exudate.  Eyes: External eye exam normal without stare, lid lag or exophthalmos.  EOM intact.  PERRL.  Neck: General: Supple without adenopathy. Thyroid: Thyroid size normal.  No goiter or nodules appreciated. No thyroid bruit.  Lungs: Clear with good BS bilat with no rales, rhonchi, or wheezes  Heart: RRR with normal S1 and S2 and no gallops; no murmurs; no rub  Abdomen: Normoactive bowel sounds, soft, nontender, without masses or organomegaly palpable  Extremities: No pretibial edema. No tremor. Normal  strength and motion throughout. See detailed diabetic foot exam below.  Skin: Normal texture and temperature to palpation. No rash noted. No Acanthosis nigricans/skin tags. No lipohypertrophy.  Neuro: MS is good with appropriate affect, pt is alert and Ox3    DM foot exam: Please see diabetic assessment flow-sheet detailed below:           DATA REVIEWED:  Lab Results  Component Value Date   HGBA1C 9.6 (A) 04/26/2021   HGBA1C 10.4 (A) 02/26/2021   HGBA1C 8.3 (A) 12/14/2020   Lab Results  Component Value Date   LDLCALC 35 02/29/2016   CREATININE 1.15 (H) 03/12/2021   No results found for: "MICRALBCREAT"   Lab Results  Component Value Date   CHOL 111 02/29/2016   HDL 55 02/29/2016   LDLCALC 35 02/29/2016   TRIG 104 02/29/2016   CHOLHDL 2.0 02/29/2016         ASSESSMENT / PLAN / RECOMMENDATIONS:   1) Type {NUMBERS 1 OR 2:522190} Diabetes Mellitus, ***controlled, With *** complications - Most recent A1c of *** %. Goal A1c < *** %.  ***  Plan: MEDICATIONS: ***  EDUCATION / INSTRUCTIONS: BG monitoring instructions: Patient is instructed  to check her blood sugars *** times a day, ***. Call [facility] if: BG persistently < 70 or > 300. I reviewed the Rule of 15 for the treatment of hypoglycemia in detail with the patient. Literature supplied.  REFERRALS: ***.   2) Diabetic complications:  Eye: Does *** have known diabetic retinopathy.  Neuro/ Feet: Does *** have known diabetic peripheral neuropathy .  Renal: Patient does *** have known baseline CKD. She   is *** on an ACEI/ARB at present. Check urine albumin/creatinine ratio yearly starting at time of diagnosis. If albuminuria is positive, treatment is geared toward better glucose, blood pressure control and use of ACE inhibitors or ARBs. Monitor electrolytes and creatinine once to twice yearly.   3) Lipids: Patient is *** on a statin.  4) Hypertension: *** at goal of < 140/90 mmHg.    F/U in  ***    Signed electronically by: Mack Guise, MD  [facility]  [facility] [address] [place], [place] [address] Phone: [phone] FAX: [phone]   CC: Georjean Mode [place], [place] [address] [place] [place] [address] Phone: [phone]  Fax: [phone]  Return to Endocrinology clinic as below: Future Appointments  Date Time Provider [place]  08/08/2021 12:10 PM Encarnacion Bole, Melanie Crazier, MD [facility] None  01/28/2022 10:40 AM [facility] DIAG [place] 1 [facility]  01/28/2022 10:50 AM [facility] [place] 1 [facility]

## 2021-08-31 ENCOUNTER — Emergency Department (HOSPITAL_COMMUNITY): Payer: Medicare Other

## 2021-08-31 ENCOUNTER — Other Ambulatory Visit: Payer: Self-pay

## 2021-08-31 ENCOUNTER — Observation Stay (HOSPITAL_COMMUNITY): Payer: Medicare Other

## 2021-08-31 ENCOUNTER — Inpatient Hospital Stay (HOSPITAL_COMMUNITY)
Admission: EM | Admit: 2021-08-31 | Discharge: 2021-09-13 | DRG: 092 | Disposition: A | Discharge status: Skilled Nursing Facility | Payer: Medicare Other | Attending: Family Medicine | Admitting: Family Medicine

## 2021-08-31 ENCOUNTER — Encounter (HOSPITAL_COMMUNITY): Payer: Self-pay | Admitting: *Deleted

## 2021-08-31 DIAGNOSIS — Z91030 Bee allergy status: Secondary | ICD-10-CM

## 2021-08-31 DIAGNOSIS — E1142 Type 2 diabetes mellitus with diabetic polyneuropathy: Secondary | ICD-10-CM | POA: Diagnosis present

## 2021-08-31 DIAGNOSIS — I2723 Pulmonary hypertension due to lung diseases and hypoxia: Secondary | ICD-10-CM | POA: Diagnosis present

## 2021-08-31 DIAGNOSIS — F32A Depression, unspecified: Secondary | ICD-10-CM | POA: Diagnosis present

## 2021-08-31 DIAGNOSIS — K219 Gastro-esophageal reflux disease without esophagitis: Secondary | ICD-10-CM | POA: Diagnosis present

## 2021-08-31 DIAGNOSIS — Z8249 Family history of ischemic heart disease and other diseases of the circulatory system: Secondary | ICD-10-CM

## 2021-08-31 DIAGNOSIS — N1832 Chronic kidney disease, stage 3b: Secondary | ICD-10-CM | POA: Diagnosis present

## 2021-08-31 DIAGNOSIS — Z993 Dependence on wheelchair: Secondary | ICD-10-CM

## 2021-08-31 DIAGNOSIS — N183 Chronic kidney disease, stage 3 unspecified: Secondary | ICD-10-CM | POA: Diagnosis present

## 2021-08-31 DIAGNOSIS — E890 Postprocedural hypothyroidism: Secondary | ICD-10-CM | POA: Diagnosis present

## 2021-08-31 DIAGNOSIS — Z853 Personal history of malignant neoplasm of breast: Secondary | ICD-10-CM

## 2021-08-31 DIAGNOSIS — F209 Schizophrenia, unspecified: Secondary | ICD-10-CM | POA: Diagnosis present

## 2021-08-31 DIAGNOSIS — F419 Anxiety disorder, unspecified: Secondary | ICD-10-CM | POA: Diagnosis present

## 2021-08-31 DIAGNOSIS — D631 Anemia in chronic kidney disease: Secondary | ICD-10-CM | POA: Diagnosis present

## 2021-08-31 DIAGNOSIS — I10 Essential (primary) hypertension: Secondary | ICD-10-CM | POA: Diagnosis present

## 2021-08-31 DIAGNOSIS — Z79899 Other long term (current) drug therapy: Secondary | ICD-10-CM

## 2021-08-31 DIAGNOSIS — Z7985 Long-term (current) use of injectable non-insulin antidiabetic drugs: Secondary | ICD-10-CM

## 2021-08-31 DIAGNOSIS — E662 Morbid (severe) obesity with alveolar hypoventilation: Secondary | ICD-10-CM | POA: Diagnosis present

## 2021-08-31 DIAGNOSIS — G4733 Obstructive sleep apnea (adult) (pediatric): Secondary | ICD-10-CM

## 2021-08-31 DIAGNOSIS — E039 Hypothyroidism, unspecified: Secondary | ICD-10-CM | POA: Diagnosis present

## 2021-08-31 DIAGNOSIS — Z833 Family history of diabetes mellitus: Secondary | ICD-10-CM

## 2021-08-31 DIAGNOSIS — I5032 Chronic diastolic (congestive) heart failure: Secondary | ICD-10-CM | POA: Diagnosis present

## 2021-08-31 DIAGNOSIS — R2689 Other abnormalities of gait and mobility: Principal | ICD-10-CM | POA: Diagnosis present

## 2021-08-31 DIAGNOSIS — Z6841 Body Mass Index (BMI) 40.0 and over, adult: Secondary | ICD-10-CM

## 2021-08-31 DIAGNOSIS — Z7982 Long term (current) use of aspirin: Secondary | ICD-10-CM

## 2021-08-31 DIAGNOSIS — R296 Repeated falls: Secondary | ICD-10-CM | POA: Diagnosis present

## 2021-08-31 DIAGNOSIS — N179 Acute kidney failure, unspecified: Secondary | ICD-10-CM | POA: Diagnosis present

## 2021-08-31 DIAGNOSIS — Z9049 Acquired absence of other specified parts of digestive tract: Secondary | ICD-10-CM

## 2021-08-31 DIAGNOSIS — E785 Hyperlipidemia, unspecified: Secondary | ICD-10-CM | POA: Diagnosis present

## 2021-08-31 DIAGNOSIS — E876 Hypokalemia: Secondary | ICD-10-CM | POA: Diagnosis not present

## 2021-08-31 DIAGNOSIS — Z90710 Acquired absence of both cervix and uterus: Secondary | ICD-10-CM

## 2021-08-31 DIAGNOSIS — Z91013 Allergy to seafood: Secondary | ICD-10-CM

## 2021-08-31 DIAGNOSIS — R0902 Hypoxemia: Secondary | ICD-10-CM | POA: Diagnosis present

## 2021-08-31 DIAGNOSIS — D638 Anemia in other chronic diseases classified elsewhere: Secondary | ICD-10-CM | POA: Diagnosis present

## 2021-08-31 DIAGNOSIS — R262 Difficulty in walking, not elsewhere classified: Secondary | ICD-10-CM | POA: Diagnosis present

## 2021-08-31 DIAGNOSIS — R531 Weakness: Secondary | ICD-10-CM | POA: Insufficient documentation

## 2021-08-31 DIAGNOSIS — Z79890 Hormone replacement therapy (postmenopausal): Secondary | ICD-10-CM

## 2021-08-31 DIAGNOSIS — J9611 Chronic respiratory failure with hypoxia: Secondary | ICD-10-CM | POA: Diagnosis present

## 2021-08-31 DIAGNOSIS — R911 Solitary pulmonary nodule: Secondary | ICD-10-CM

## 2021-08-31 DIAGNOSIS — I130 Hypertensive heart and chronic kidney disease with heart failure and stage 1 through stage 4 chronic kidney disease, or unspecified chronic kidney disease: Secondary | ICD-10-CM | POA: Diagnosis present

## 2021-08-31 DIAGNOSIS — Z79891 Long term (current) use of opiate analgesic: Secondary | ICD-10-CM

## 2021-08-31 DIAGNOSIS — J449 Chronic obstructive pulmonary disease, unspecified: Secondary | ICD-10-CM | POA: Diagnosis present

## 2021-08-31 DIAGNOSIS — Z602 Problems related to living alone: Secondary | ICD-10-CM | POA: Diagnosis present

## 2021-08-31 DIAGNOSIS — B009 Herpesviral infection, unspecified: Secondary | ICD-10-CM | POA: Diagnosis present

## 2021-08-31 DIAGNOSIS — Z794 Long term (current) use of insulin: Secondary | ICD-10-CM

## 2021-08-31 DIAGNOSIS — Z803 Family history of malignant neoplasm of breast: Secondary | ICD-10-CM

## 2021-08-31 DIAGNOSIS — Z713 Dietary counseling and surveillance: Secondary | ICD-10-CM

## 2021-08-31 DIAGNOSIS — N189 Chronic kidney disease, unspecified: Secondary | ICD-10-CM | POA: Diagnosis present

## 2021-08-31 DIAGNOSIS — G894 Chronic pain syndrome: Secondary | ICD-10-CM | POA: Diagnosis present

## 2021-08-31 DIAGNOSIS — E1122 Type 2 diabetes mellitus with diabetic chronic kidney disease: Secondary | ICD-10-CM | POA: Diagnosis present

## 2021-08-31 DIAGNOSIS — E119 Type 2 diabetes mellitus without complications: Secondary | ICD-10-CM

## 2021-08-31 DIAGNOSIS — D649 Anemia, unspecified: Secondary | ICD-10-CM

## 2021-08-31 DIAGNOSIS — I509 Heart failure, unspecified: Secondary | ICD-10-CM

## 2021-08-31 DIAGNOSIS — Z923 Personal history of irradiation: Secondary | ICD-10-CM

## 2021-08-31 DIAGNOSIS — Z888 Allergy status to other drugs, medicaments and biological substances status: Secondary | ICD-10-CM

## 2021-08-31 DIAGNOSIS — Z96653 Presence of artificial knee joint, bilateral: Secondary | ICD-10-CM | POA: Diagnosis present

## 2021-08-31 DIAGNOSIS — G219 Secondary parkinsonism, unspecified: Secondary | ICD-10-CM | POA: Diagnosis present

## 2021-08-31 DIAGNOSIS — Z87891 Personal history of nicotine dependence: Secondary | ICD-10-CM

## 2021-08-31 HISTORY — DX: Depression, unspecified: F32.A

## 2021-08-31 HISTORY — DX: Anxiety disorder, unspecified: F41.9

## 2021-08-31 LAB — CBC WITH DIFFERENTIAL/PLATELET
Abs Immature Granulocytes: 0.01 10*3/uL (ref 0.00–0.07)
Basophils Absolute: 0.1 10*3/uL (ref 0.0–0.1)
Basophils Relative: 1 %
Eosinophils Absolute: 0.4 10*3/uL (ref 0.0–0.5)
Eosinophils Relative: 6 %
HCT: 29.4 % — ABNORMAL LOW (ref 36.0–46.0)
Hemoglobin: 9.4 g/dL — ABNORMAL LOW (ref 12.0–15.0)
Immature Granulocytes: 0 %
Lymphocytes Relative: 21 %
Lymphs Abs: 1.2 10*3/uL (ref 0.7–4.0)
MCH: 28.1 pg (ref 26.0–34.0)
MCHC: 32 g/dL (ref 30.0–36.0)
MCV: 87.8 fL (ref 80.0–100.0)
Monocytes Absolute: 0.5 10*3/uL (ref 0.1–1.0)
Monocytes Relative: 8 %
Neutro Abs: 3.7 10*3/uL (ref 1.7–7.7)
Neutrophils Relative %: 64 %
Platelets: 216 10*3/uL (ref 150–400)
RBC: 3.35 MIL/uL — ABNORMAL LOW (ref 3.87–5.11)
RDW: 16 % — ABNORMAL HIGH (ref 11.5–15.5)
WBC: 5.8 10*3/uL (ref 4.0–10.5)
nRBC: 0 % (ref 0.0–0.2)

## 2021-08-31 LAB — COMPREHENSIVE METABOLIC PANEL
ALT: 20 U/L (ref 0–44)
AST: 12 U/L — ABNORMAL LOW (ref 15–41)
Albumin: 3.4 g/dL — ABNORMAL LOW (ref 3.5–5.0)
Alkaline Phosphatase: 81 U/L (ref 38–126)
Anion gap: 11 (ref 5–15)
BUN: 24 mg/dL — ABNORMAL HIGH (ref 8–23)
CO2: 31 mmol/L (ref 22–32)
Calcium: 9.4 mg/dL (ref 8.9–10.3)
Chloride: 99 mmol/L (ref 98–111)
Creatinine, Ser: 1.43 mg/dL — ABNORMAL HIGH (ref 0.44–1.00)
GFR, Estimated: 41 mL/min — ABNORMAL LOW (ref >60)
Glucose, Bld: 143 mg/dL — ABNORMAL HIGH (ref 70–99)
Potassium: 3.1 mmol/L — ABNORMAL LOW (ref 3.5–5.1)
Sodium: 141 mmol/L (ref 135–145)
Total Bilirubin: 0.4 mg/dL (ref 0.3–1.2)
Total Protein: 6.1 g/dL — ABNORMAL LOW (ref 6.5–8.1)

## 2021-08-31 LAB — CBG MONITORING, ED
Glucose-Capillary: 164 mg/dL — ABNORMAL HIGH (ref 70–99)
Glucose-Capillary: 130 mg/dL — ABNORMAL HIGH (ref 70–99)
Glucose-Capillary: 143 mg/dL — ABNORMAL HIGH (ref 70–99)

## 2021-08-31 LAB — URINALYSIS, ROUTINE W REFLEX MICROSCOPIC
Bilirubin Urine: NEGATIVE
Glucose, UA: NEGATIVE mg/dL
Hgb urine dipstick: NEGATIVE
Ketones, ur: NEGATIVE mg/dL
Leukocytes,Ua: NEGATIVE
Nitrite: NEGATIVE
Protein, ur: NEGATIVE mg/dL
Specific Gravity, Urine: 1.005 (ref 1.005–1.030)
pH: 6 (ref 5.0–8.0)

## 2021-08-31 LAB — MAGNESIUM: Magnesium: 1.8 mg/dL (ref 1.7–2.4)

## 2021-08-31 LAB — CK: Total CK: 277 U/L — ABNORMAL HIGH (ref 38–234)

## 2021-08-31 MED ORDER — SUCRALFATE 1 G PO TABS
1.0000 g | ORAL_TABLET | Freq: Every morning | ORAL | Status: DC
Start: 2021-08-31 — End: 2021-09-13
  Administered 2021-08-31 – 2021-09-13 (×14): 1 g via ORAL
  Filled 2021-08-31 (×15): qty 1

## 2021-08-31 MED ORDER — METHOCARBAMOL 750 MG PO TABS
750.0000 mg | ORAL_TABLET | Freq: Three times a day (TID) | ORAL | Status: DC
  Administered 2021-08-31 – 2021-09-13 (×41): 750 mg via ORAL
  Filled 2021-08-31: qty 2
  Filled 2021-08-31 (×3): qty 1
  Filled 2021-08-31: qty 2
  Filled 2021-08-31 (×11): qty 1
  Filled 2021-08-31: qty 2
  Filled 2021-08-31 (×17): qty 1
  Filled 2021-08-31: qty 2
  Filled 2021-08-31 (×2): qty 1
  Filled 2021-08-31: qty 2
  Filled 2021-08-31 (×3): qty 1

## 2021-08-31 MED ORDER — ENOXAPARIN SODIUM 80 MG/0.8ML IJ SOSY
70.0000 mg | PREFILLED_SYRINGE | INTRAMUSCULAR | Status: DC
  Administered 2021-08-31 – 2021-09-09 (×10): 70 mg via SUBCUTANEOUS
  Filled 2021-08-31 (×4): qty 0.8
  Filled 2021-08-31: qty 0.7
  Filled 2021-08-31 (×3): qty 0.8
  Filled 2021-08-31 (×2): qty 0.7
  Filled 2021-08-31 (×2): qty 0.8

## 2021-08-31 MED ORDER — PANTOPRAZOLE SODIUM 40 MG PO TBEC
40.0000 mg | DELAYED_RELEASE_TABLET | Freq: Every day | ORAL | Status: DC
  Administered 2021-08-31 – 2021-09-13 (×14): 40 mg via ORAL
  Filled 2021-08-31 (×14): qty 1

## 2021-08-31 MED ORDER — INSULIN ASPART 100 UNIT/ML IJ SOLN: 0.0000 [IU] | Freq: Every day | INTRAMUSCULAR | Status: DC

## 2021-08-31 MED ORDER — ACETAMINOPHEN 650 MG RE SUPP: 650.0000 mg | Freq: Four times a day (QID) | RECTAL | Status: DC | PRN

## 2021-08-31 MED ORDER — ALBUTEROL SULFATE HFA 108 (90 BASE) MCG/ACT IN AERS: 2.0000 | INHALATION_SPRAY | RESPIRATORY_TRACT | Status: DC | PRN

## 2021-08-31 MED ORDER — OXYCODONE HCL 5 MG PO TABS
5.0000 mg | ORAL_TABLET | ORAL | Status: DC
  Administered 2021-08-31 – 2021-09-13 (×70): 5 mg via ORAL
  Filled 2021-08-31 (×74): qty 1

## 2021-08-31 MED ORDER — VENLAFAXINE HCL ER 75 MG PO CP24
150.0000 mg | ORAL_CAPSULE | Freq: Two times a day (BID) | ORAL | Status: DC
  Administered 2021-08-31 – 2021-09-13 (×27): 150 mg via ORAL
  Filled 2021-08-31 (×2): qty 2
  Filled 2021-08-31: qty 1
  Filled 2021-08-31 (×13): qty 2
  Filled 2021-08-31: qty 1
  Filled 2021-08-31 (×2): qty 2
  Filled 2021-08-31: qty 1
  Filled 2021-08-31 (×4): qty 2
  Filled 2021-08-31: qty 1
  Filled 2021-08-31 (×2): qty 2
  Filled 2021-08-31: qty 1
  Filled 2021-08-31: qty 2

## 2021-08-31 MED ORDER — ONDANSETRON HCL 4 MG/2ML IJ SOLN: 4.0000 mg | Freq: Four times a day (QID) | INTRAMUSCULAR | Status: DC | PRN

## 2021-08-31 MED ORDER — BUPRENORPHINE HCL 300 MCG BU FILM: 300.0000 ug | ORAL_FILM | Freq: Two times a day (BID) | BUCCAL | Status: DC

## 2021-08-31 MED ORDER — ACETAMINOPHEN 325 MG PO TABS
650.0000 mg | ORAL_TABLET | Freq: Four times a day (QID) | ORAL | Status: DC | PRN
  Administered 2021-09-07 – 2021-09-11 (×3): 650 mg via ORAL
  Filled 2021-08-31 (×3): qty 2

## 2021-08-31 MED ORDER — ASPIRIN 81 MG PO TBEC
81.0000 mg | DELAYED_RELEASE_TABLET | Freq: Every day | ORAL | Status: DC
  Administered 2021-08-31 – 2021-09-13 (×14): 81 mg via ORAL
  Filled 2021-08-31 (×14): qty 1

## 2021-08-31 MED ORDER — INSULIN NPH (HUMAN) (ISOPHANE) 100 UNIT/ML ~~LOC~~ SUSP
55.0000 [IU] | Freq: Every day | SUBCUTANEOUS | Status: DC
  Administered 2021-09-01 – 2021-09-13 (×13): 55 [IU] via SUBCUTANEOUS
  Filled 2021-08-31 (×2): qty 10

## 2021-08-31 MED ORDER — INSULIN ASPART 100 UNIT/ML IJ SOLN
0.0000 [IU] | Freq: Three times a day (TID) | INTRAMUSCULAR | Status: DC
  Administered 2021-08-31: 3 [IU] via SUBCUTANEOUS
  Administered 2021-08-31: 4 [IU] via SUBCUTANEOUS
  Administered 2021-09-01: 3 [IU] via SUBCUTANEOUS
  Administered 2021-09-02: 4 [IU] via SUBCUTANEOUS
  Administered 2021-09-02: 3 [IU] via SUBCUTANEOUS
  Administered 2021-09-03: 7 [IU] via SUBCUTANEOUS
  Administered 2021-09-03 (×2): 4 [IU] via SUBCUTANEOUS
  Administered 2021-09-04: 3 [IU] via SUBCUTANEOUS
  Administered 2021-09-04: 4 [IU] via SUBCUTANEOUS
  Administered 2021-09-04 – 2021-09-06 (×2): 3 [IU] via SUBCUTANEOUS
  Administered 2021-09-07: 4 [IU] via SUBCUTANEOUS
  Administered 2021-09-07: 2 [IU] via SUBCUTANEOUS
  Administered 2021-09-08: 7 [IU] via SUBCUTANEOUS
  Administered 2021-09-09 – 2021-09-10 (×3): 3 [IU] via SUBCUTANEOUS
  Administered 2021-09-11 – 2021-09-12 (×3): 4 [IU] via SUBCUTANEOUS
  Administered 2021-09-12 – 2021-09-13 (×2): 3 [IU] via SUBCUTANEOUS

## 2021-08-31 MED ORDER — ENOXAPARIN SODIUM 40 MG/0.4ML IJ SOSY: 40.0000 mg | PREFILLED_SYRINGE | INTRAMUSCULAR | Status: DC

## 2021-08-31 MED ORDER — LORAZEPAM 1 MG PO TABS
1.0000 mg | ORAL_TABLET | Freq: Three times a day (TID) | ORAL | Status: DC
  Administered 2021-08-31 – 2021-09-13 (×41): 1 mg via ORAL
  Filled 2021-08-31 (×41): qty 1

## 2021-08-31 MED ORDER — CALCITRIOL 0.25 MCG PO CAPS
0.2500 ug | ORAL_CAPSULE | Freq: Every day | ORAL | Status: DC
  Administered 2021-08-31 – 2021-09-13 (×14): 0.25 ug via ORAL
  Filled 2021-08-31 (×15): qty 1

## 2021-08-31 MED ORDER — ROSUVASTATIN CALCIUM 20 MG PO TABS
20.0000 mg | ORAL_TABLET | Freq: Every day | ORAL | Status: DC
  Administered 2021-08-31 – 2021-09-13 (×14): 20 mg via ORAL
  Filled 2021-08-31 (×14): qty 1

## 2021-08-31 MED ORDER — ONDANSETRON HCL 4 MG PO TABS: 4.0000 mg | ORAL_TABLET | Freq: Four times a day (QID) | ORAL | Status: DC | PRN

## 2021-08-31 MED ORDER — POTASSIUM CHLORIDE CRYS ER 20 MEQ PO TBCR
40.0000 meq | EXTENDED_RELEASE_TABLET | Freq: Once | ORAL | Status: AC
  Administered 2021-08-31: 40 meq via ORAL
  Filled 2021-08-31: qty 2

## 2021-08-31 MED ORDER — TRAMADOL HCL 50 MG PO TABS
50.0000 mg | ORAL_TABLET | Freq: Four times a day (QID) | ORAL | Status: DC | PRN
  Administered 2021-09-03 – 2021-09-12 (×3): 50 mg via ORAL
  Filled 2021-08-31 (×3): qty 1

## 2021-08-31 MED ORDER — LOSARTAN POTASSIUM-HCTZ 100-25 MG PO TABS: 1.0000 | ORAL_TABLET | Freq: Every morning | ORAL | Status: DC

## 2021-08-31 MED ORDER — LEVOTHYROXINE SODIUM 100 MCG PO TABS
200.0000 ug | ORAL_TABLET | Freq: Every day | ORAL | Status: DC
  Administered 2021-08-31 – 2021-09-13 (×15): 200 ug via ORAL
  Filled 2021-08-31 (×14): qty 2

## 2021-08-31 MED ORDER — MAGNESIUM SULFATE 2 GM/50ML IV SOLN
2.0000 g | Freq: Once | INTRAVENOUS | Status: AC
  Administered 2021-08-31: 2 g via INTRAVENOUS
  Filled 2021-08-31: qty 50

## 2021-08-31 MED ORDER — DOCUSATE SODIUM 100 MG PO CAPS
100.0000 mg | ORAL_CAPSULE | Freq: Two times a day (BID) | ORAL | Status: DC
  Administered 2021-08-31 – 2021-09-09 (×13): 100 mg via ORAL
  Filled 2021-08-31 (×25): qty 1

## 2021-08-31 MED ORDER — DOXEPIN HCL 50 MG PO CAPS
100.0000 mg | ORAL_CAPSULE | Freq: Every day | ORAL | Status: DC
  Administered 2021-08-31 – 2021-09-12 (×13): 100 mg via ORAL
  Filled 2021-08-31: qty 2
  Filled 2021-08-31: qty 1
  Filled 2021-08-31 (×15): qty 2

## 2021-08-31 MED ORDER — POLYVINYL ALCOHOL 1.4 % OP SOLN
1.0000 [drp] | OPHTHALMIC | Status: DC | PRN
Start: 2021-08-31 — End: 2021-09-13
  Filled 2021-08-31: qty 15

## 2021-08-31 MED ORDER — NALOXONE HCL 4 MG/0.1ML NA LIQD: 1.0000 | NASAL | Status: DC | PRN

## 2021-08-31 MED ORDER — MIRABEGRON ER 25 MG PO TB24
25.0000 mg | ORAL_TABLET | Freq: Every morning | ORAL | Status: DC
  Administered 2021-08-31 – 2021-09-13 (×14): 25 mg via ORAL
  Filled 2021-08-31 (×16): qty 1

## 2021-08-31 MED ORDER — MORPHINE SULFATE (PF) 2 MG/ML IV SOLN: 1.0000 mg | INTRAVENOUS | Status: DC | PRN

## 2021-08-31 MED ORDER — PREGABALIN 100 MG PO CAPS
300.0000 mg | ORAL_CAPSULE | Freq: Two times a day (BID) | ORAL | Status: DC
  Administered 2021-08-31 – 2021-09-13 (×27): 300 mg via ORAL
  Filled 2021-08-31 (×27): qty 3

## 2021-08-31 MED ORDER — ALBUTEROL SULFATE (2.5 MG/3ML) 0.083% IN NEBU
2.5000 mg | INHALATION_SOLUTION | RESPIRATORY_TRACT | Status: DC | PRN
Start: 2021-08-31 — End: 2021-09-13

## 2021-08-31 MED ORDER — ZIPRASIDONE HCL 40 MG PO CAPS
60.0000 mg | ORAL_CAPSULE | Freq: Every day | ORAL | Status: DC
  Administered 2021-08-31 – 2021-09-12 (×13): 60 mg via ORAL
  Filled 2021-08-31 (×17): qty 1

## 2021-08-31 MED ORDER — FAMOTIDINE 20 MG PO TABS
40.0000 mg | ORAL_TABLET | Freq: Every day | ORAL | Status: DC
  Administered 2021-08-31 – 2021-09-11 (×12): 40 mg via ORAL
  Filled 2021-08-31 (×12): qty 2

## 2021-08-31 MED ORDER — LINACLOTIDE 145 MCG PO CAPS
290.0000 ug | ORAL_CAPSULE | Freq: Every day | ORAL | Status: DC
Start: 2021-09-01 — End: 2021-09-13
  Administered 2021-09-01 – 2021-09-12 (×12): 290 ug via ORAL
  Filled 2021-08-31 (×14): qty 2

## 2021-08-31 MED ORDER — HYDRALAZINE HCL 20 MG/ML IJ SOLN
5.0000 mg | INTRAMUSCULAR | Status: DC | PRN
  Filled 2021-08-31: qty 1

## 2021-08-31 MED ORDER — LORAZEPAM 0.5 MG PO TABS: 0.5000 mg | ORAL_TABLET | Freq: Three times a day (TID) | ORAL | Status: DC | PRN

## 2021-08-31 MED ORDER — CYCLOBENZAPRINE HCL 10 MG PO TABS
10.0000 mg | ORAL_TABLET | Freq: Every day | ORAL | Status: DC
  Administered 2021-08-31 – 2021-09-12 (×13): 10 mg via ORAL
  Filled 2021-08-31 (×13): qty 1

## 2021-08-31 MED ORDER — VALACYCLOVIR HCL 500 MG PO TABS
1000.0000 mg | ORAL_TABLET | Freq: Every day | ORAL | Status: DC
  Administered 2021-08-31 – 2021-09-13 (×14): 1000 mg via ORAL
  Filled 2021-08-31 (×15): qty 2

## 2021-08-31 MED ORDER — INSULIN ISOPHANE HUMAN 100 UNIT/ML KWIKPEN
55.0000 [IU] | PEN_INJECTOR | SUBCUTANEOUS | Status: DC
Start: 2021-08-31 — End: 2021-08-31

## 2021-08-31 MED ORDER — LUBIPROSTONE 24 MCG PO CAPS
24.0000 ug | ORAL_CAPSULE | Freq: Two times a day (BID) | ORAL | Status: DC
  Administered 2021-08-31 – 2021-09-13 (×25): 24 ug via ORAL
  Filled 2021-08-31 (×31): qty 1

## 2021-08-31 NOTE — Progress Notes (Signed)
PHARMACIST - PHYSICIAN COMMUNICATION  CONCERNING:  Enoxaparin (Lovenox) for DVT Prophylaxis    RECOMMENDATION: Patient was prescribed enoxaprin 58m q24 hours for VTE prophylaxis.   Filed Weights   08/31/21 0449  Weight: (!) 145.2 kg (320 lb)    Body mass index is 53.25 kg/m.  Estimated Creatinine Clearance: 57.1 mL/min (A) (by C-G formula based on SCr of 1.43 mg/dL (H)).   Based on [place] candidate for enoxaparin 0.555mkg TBW SQ every 24 hours based on BMI being >30.  Patient is candidate for enoxaparin 3040mvery 24 hours based on CrCl <15m2mn or Weight <45kg  DESCRIPTION: [facility]  Patient is now receiving enoxaparin 70 mg every 24 hours    SusaBerta MinorarmD Clinical Pharmacist  08/31/2021 11:55 AM

## 2021-08-31 NOTE — ED Provider Notes (Signed)
[facility] EMERGENCY DEPARTMENT Provider Note   CSN: [id number] Arrival date & time: 08/31/21  0431     History  No chief complaint on file.   [name] is a 66 y.o. female.  The history is provided by the patient.  She has history of hypertension, diabetes, hyperlipidemia, chronic kidney disease and is brought in by ambulance because of multiple falls at home.  She thinks she has fallen 4 or 5 times in the last 2 days.  She is unable to get herself up off the floor when she falls, but that is not new for her.  Frequent falls is new for her.  She denies any headache, cough, nausea, vomiting, diarrhea.  She denies fever, chills, sweats.  She does admit to urinary urgency and frequency without dysuria.   Home Medications Prior to Admission medications   Medication Sig Start Date End Date Taking? Authorizing Provider  acetaminophen (TYLENOL) 650 MG CR tablet Take 1,300 mg by mouth every 8 (eight) hours as needed for pain.    [provider]  albuterol (VENTOLIN HFA) 108 (90 Base) MCG/ACT inhaler Inhale 2 puffs into the lungs every 4 (four) hours as needed for wheezing or shortness of breath.     [provider]  amLODipine (NORVASC) 10 MG tablet Take 1 tablet (10 mg total) by mouth daily. 10/07/20   Shawna Clamp, MD  Ascorbic Acid (VITAMIN C PO) Take 1 tablet by mouth in the morning.    [provider]  aspirin EC 81 MG tablet Take 1 tablet (81 mg total) by mouth daily. 08/12/13   Viyuoh, Alison Stalling, MD  BELBUCA 300 MCG FILM Take 300 mcg by mouth 2 (two) times daily. 01/09/21   [provider]  belladonna-opium (B&O SUPPRETTES) 16.2-30 MG suppository Place 1 suppository rectally 2 (two) times daily as needed (for bladder spasms). 11/14/20   Molpus, John, MD  calcitRIOL (ROCALTROL) 0.25 MCG capsule TAKE ONE CAPSULE BY MOUTH three times PER WEEK 06/17/21   Renato Shin, MD  Cholecalciferol (VITAMIN D-3) 1000 units CAPS Take 1,000  Units by mouth daily.    [provider]  Cyanocobalamin (VITAMIN B-12 PO) Take 1 tablet by mouth daily.    [provider]  cyclobenzaprine (FLEXERIL) 10 MG tablet Take 10 mg by mouth at bedtime. 08/31/20   [provider]  doxepin (SINEQUAN) 50 MG capsule Take 100 mg by mouth at bedtime. 09/24/20   [provider]  esomeprazole (NEXIUM) 40 MG capsule Take 1 capsule (40 mg total) by mouth daily before breakfast. 10/30/14   Barton Dubois, MD  famotidine (PEPCID) 40 MG tablet Take 40 mg by mouth daily after supper. 06/16/20   [provider]  ferrous sulfate 325 (65 FE) MG tablet Take 325 mg by mouth in the morning.    [provider]  furosemide (LASIX) 20 MG tablet Take 2 tablets (40 mg total) by mouth daily. Patient taking differently: Take 60 mg by mouth in the morning. 09/09/19   Renato Shin, MD  Insulin NPH, Human,, Isophane, (HUMULIN N KWIKPEN) 100 UNIT/ML Kiwkpen Inject 55 Units into the skin every morning. And pen needles 1/day 04/26/21   Renato Shin, MD  Lancets [facility] DELICA PLUS [id number]) Dubois 1 each by Other route 3 (three) times daily. Use to check blood sugar 3 times a day 05/13/21   Renato Shin, MD  levothyroxine (SYNTHROID) 200 MCG tablet Take 1 tablet (200 mcg total) by mouth daily before breakfast. 01/30/21  Renato Shin, MD  linaclotide [facility]) 290 MCG CAPS capsule Take 290 mcg by mouth daily before breakfast.    [provider]  LORazepam (ATIVAN) 0.5 MG tablet Take 1 tablet (0.5 mg total) by mouth every 8 (eight) hours as needed for anxiety. 10/06/20   Shawna Clamp, MD  LORazepam (ATIVAN) 1 MG tablet Take 1 mg by mouth in the morning, at noon, and at bedtime.    [provider]  losartan-hydrochlorothiazide (HYZAAR) 100-25 MG tablet Take 1 tablet by mouth in the morning. 09/17/20   [provider]  lubiprostone (AMITIZA) 24 MCG capsule Take 24 mcg by mouth 2 (two) times daily. 06/22/20    [provider]  methocarbamol (ROBAXIN) 750 MG tablet Take 750 mg by mouth 3 (three) times daily. 08/31/20   [provider]  mirabegron ER (MYRBETRIQ) 25 MG TB24 tablet Take 25 mg by mouth in the morning.    [provider]  naloxone [facility]) nasal spray 4 mg/0.1 mL Place 1 spray into the nose as needed for opioid reversal. 08/01/20   [provider]  [facility] VERIO test strip TEST BLOOD SUGAR THREE TIMES DAILY 03/08/21   Renato Shin, MD  penicillin v potassium (VEETID) 500 MG tablet Take 500 mg by mouth 4 (four) times daily.    [provider]  polyvinyl alcohol (LIQUIFILM TEARS) 1.4 % ophthalmic solution 1 drop as needed for dry eyes.    [provider]  potassium chloride SA (KLOR-CON) 20 MEQ tablet Take 20 mEq by mouth daily. 06/22/20   [provider]  pregabalin (LYRICA) 300 MG capsule Take 300 mg by mouth 2 (two) times daily.    [provider]  rosuvastatin (CRESTOR) 20 MG tablet Take 20 mg by mouth daily.    [provider]  sucralfate (CARAFATE) 1 g tablet Take 1 g by mouth in the morning. 08/13/20   [provider]  SURE COMFORT PEN NEEDLES 32G X 6 MM MISC USE TO INJECT INSULIN FOUR TIMES DAILY 03/13/21   Renato Shin, MD  tolterodine (DETROL LA) 2 MG 24 hr capsule Take 2 mg by mouth every evening. 09/24/20   [provider]  traMADol (ULTRAM) 50 MG tablet Take 1 tablet (50 mg total) by mouth every 6 (six) hours as needed. 03/13/21 03/13/22  Ralene Ok, MD  TRULICITY 4.5 YQ/8.2NO SOPN Inject 4.5 mg into the skin once a week. Patient taking differently: Inject 4.5 mg into the skin every Saturday. 09/06/20   Renato Shin, MD  valACYclovir (VALTREX) 1000 MG tablet Take 1,000 mg by mouth daily.    [provider]  venlafaxine XR (EFFEXOR-XR) 150 MG 24 hr capsule Take 150 mg by mouth 2 (two) times daily. 05/21/15   [provider]  Vitamin D, Ergocalciferol, (DRISDOL) 1.25 MG  (50000 UNIT) CAPS capsule Take 1 capsule (50,000 Units total) by mouth every 14 (fourteen) days. 04/29/21   Renato Shin, MD  ziprasidone (GEODON) 60 MG capsule Take 60 mg by mouth daily after supper.    [provider]      Allergies    Bee venom, Invokana [canagliflozin], Shellfish allergy, Buprenorphine hcl, and Other    Review of Systems   Review of Systems  All other systems reviewed and are negative.   Physical Exam Updated Vital Signs BP 124/68 (BP Location: Right Wrist)   Pulse 92   Temp 98.2 F (36.8 C) (Oral)   Resp 16   Ht _0  (1.651 m)   Wt [name])  145.2 kg   SpO2 99%   BMI 53.25 kg/m  Physical Exam Vitals and nursing note reviewed.   Morbidly obese 66 year old female, resting comfortably and in no acute distress. Vital signs are normal. Oxygen saturation is 99%, which is normal. Head is normocephalic and atraumatic. PERRLA, EOMI. Oropharynx is clear. Neck is nontender and supple without adenopathy or JVD. Back is nontender and there is no CVA tenderness. Lungs are clear without rales, wheezes, or rhonchi. Chest is nontender. Heart has regular rate and rhythm without murmur. Abdomen is soft, flat, nontender without masses or hepatosplenomegaly and peristalsis is normoactive. Extremities have 2-3+ edema, full range of motion is present. Skin is warm and dry without rash. Neurologic: Mental status is normal, cranial nerves are intact.  Objectively, strength is 5/5 in all 4 extremities, but she does struggle to raise her legs up against gravity.  ED Results / Procedures / Treatments   Labs (all labs ordered are listed, but only abnormal results are displayed) Labs Reviewed  COMPREHENSIVE METABOLIC PANEL - Abnormal; Notable for the following components:      Result Value   Potassium 3.1 (*)    Glucose, Bld 143 (*)    BUN 24 (*)    Creatinine, Ser 1.43 (*)    Total Protein 6.1 (*)    Albumin 3.4 (*)    AST 12 (*)    GFR, Estimated 41 (*)    All other  components within normal limits  CBC WITH DIFFERENTIAL/PLATELET - Abnormal; Notable for the following components:   RBC 3.35 (*)    Hemoglobin 9.4 (*)    HCT 29.4 (*)    RDW 16.0 (*)    All other components within normal limits  CK - Abnormal; Notable for the following components:   Total CK 277 (*)    All other components within normal limits  URINALYSIS, ROUTINE W REFLEX MICROSCOPIC - Abnormal; Notable for the following components:   Color, Urine STRAW (*)    All other components within normal limits  MAGNESIUM    EKG EKG Interpretation  Date/Time:  Saturday August 31 2021 04:44:54 EDT Ventricular Rate:  91 PR Interval:  194 QRS Duration: 92 QT Interval:  398 QTC Calculation: 490 R Axis:   -18 Text Interpretation: Sinus rhythm Borderline left axis deviation Low voltage, precordial leads Minimal ST elevation, lateral leads Borderline prolonged QT interval When compared with ECG of 09/29/2020, QT has shortened Confirmed by Delora Fuel ([id number]) on 08/31/2021 4:49:45 AM  Radiology DG Chest Port 1 View  Result Date: 08/31/2021 CLINICAL DATA:  66 year old female with history of weakness and shortness of breath. EXAM: PORTABLE CHEST 1 VIEW COMPARISON:  Chest x-ray 09/30/2010. FINDINGS: Lung volumes are normal. Patchy areas of interstitial prominence and peribronchial cuffing noted throughout the mid to lower lung bilaterally, where there also several ill-defined opacities, most notably in the left mid lung where there is a ill-defined nodular density which is slightly more prominent than the prior chest x-ray. No pleural effusions. No pneumothorax. Cephalization of the pulmonary vasculature. Heart size is mildly enlarged. Upper mediastinal contours are within normal limits. Surgical clips project over the lower right cervical region, likely from prior thyroidectomy. IMPRESSION: 1. Increasingly conspicuous nodular density in the left mid lung concerning for potential neoplasm. Further evaluation  with nonemergent chest CT is recommended in the near future to better evaluate this finding. 2. Diffuse bronchial wall thickening and peribronchial cuffing, which could indicate an acute bronchitis. 3. Cardiomegaly. Electronically Signed  By: Vinnie Langton M.D.   On: 08/31/2021 05:58    Procedures Procedures  Cardiac monitor shows normal sinus rhythm, per my interpretation.  Medications Ordered in ED Medications - No data to display  ED Course/ Medical Decision Making/ A&P                           Medical Decision Making Amount and/or Complexity of Data Reviewed Labs: ordered. Radiology: ordered.  Risk Prescription drug management.   Multiple falls, inability to raise herself up off the floor.  This is concerning for possible occult infection.  Consider electrolyte disturbance, myopathy.  No focal findings to suggest stroke.  I have ordered ECG, chest x-ray, CBC, comprehensive metabolic panel, CK, urinalysis.  I have independently reviewed and interpreted the ECG and my interpretation is borderline left axis deviation, borderline prolonged QT interval which has actually decreased compared with prior ECG.  Chest x-ray shows diffuse bronchial wall thickening, possible acute bronchitis.  Also, nodular density in the left midlung concerning for potential neoplasm.  Cardiomegaly.  I have independently viewed the image, and agree with the radiologist's interpretation.  I have reviewed and interpreted laboratory tests, and my interpretation is hypokalemia, acute kidney injury (creatinine 1.43 today compared with 1.15 on 03/12/2021), progressive anemia (hemoglobin 9.4 today compared with 11.2 on 03/12/2021.  I have ordered oral potassium and I have ordered a magnesium level in light of hypokalemia.  Magnesium level has come back normal at 1.8, but this is in the lower end of the normal range, and I have ordered some intravenous magnesium.  Urinalysis does not show any evidence of infection.  CK  is minimally elevated, probably not clinically significant.  Weakness appears to be multifactorial with combination of falling hemoglobin, hypokalemia, acute kidney injury.  Case is discussed with Dr. Lorin Mercy of [facility] regarding possible admission.  Dr. Lorin Mercy agrees to see the patient in consultation to decide if she does meet admission criteria.  Final Clinical Impression(s) / ED Diagnoses Final diagnoses:  Multiple falls  Weakness  Acute kidney injury (nontraumatic) (HCC)  Hypokalemia  Normochromic normocytic anemia  Lung nodule seen on imaging study    Rx / DC Orders ED Discharge Orders     None         Delora Fuel, MD 94/58/59 [phone]

## 2021-08-31 NOTE — ED Notes (Signed)
Patient sats drop to low 80:s when sleeping placed on 3 liters [place] and sats increased to 96%

## 2021-08-31 NOTE — ED Triage Notes (Signed)
Patient presents to ed via [facility] from home of which she lives alone , per ems they have been to her house 4 times today for falls which trying to transfer from her bed to wheelchair, states she had had multiple falls in the last several weeks and she calls 911 to help with getting her up. States her legs are becoming more and more weak. Patient is alert oriented. No injuries from falls.

## 2021-08-31 NOTE — ED Notes (Signed)
Patient uncomfortable in ER stretcher. Reports severe back pain. RN to obtain a hospital bed for patient comfort.

## 2021-08-31 NOTE — Evaluation (Signed)
Physical Therapy Evaluation Patient Details Name: [name] MRN: [id number] DOB: 08-Nov-1955 Today's Date: 08/31/2021  History of Present Illness  Pt is a 65 y/o female admitted secondary to multiple falls. Workup pending. PMH includes Breast cancer, HSV  infection, Memory difficulty, OSA on CPAP,  Schizophrenia, Secondary parkinsonism, and Type II diabetes mellitus.  Clinical Impression  Pt admitted secondary to problem above with deficits below. Pt requiring min A to come into long sitting. However, when attempting to move BLE off of bed, pt with increased pain and noted some instability in R knee, so further mobility deferred. Notified MD and RN. Pt with increased risk for falls and anticipate pt will have difficulty caring for herself. Recommending SNF level therapies at d/c. Will continue to follow acutely.        Recommendations for follow up therapy are one component of a multi-disciplinary discharge planning process, led by the attending physician.  Recommendations may be updated based on patient status, additional functional criteria and insurance authorization.  Follow Up Recommendations Skilled nursing-short term rehab (<3 hours/day) Can patient physically be transported by private vehicle: No    Assistance Recommended at Discharge Frequent or constant Supervision/Assistance  Patient can return home with the following  Two people to help with walking and/or transfers;Two people to help with bathing/dressing/bathroom;Assistance with cooking/housework;Assist for transportation;Help with stairs or ramp for entrance    Equipment Recommendations None recommended by PT  Recommendations for Other Services       Functional Status Assessment Patient has had a recent decline in their functional status and demonstrates the ability to make significant improvements in function in a reasonable and predictable amount of time.     Precautions / Restrictions Precautions Precautions:  Fall Precaution Comments: Unable to recall how many falls she has had within the last month. 4 within a day Restrictions Weight Bearing Restrictions: No      Mobility  Bed Mobility Overal bed mobility: Needs Assistance Bed Mobility: Supine to Sit     Supine to sit: Min assist     General bed mobility comments: Min A for trunk assist to come into long sitting. Once in long sitting attempted to bring LEs to EOB, however, pt yelling out in pain when RLE moved. Also noted some knee instability, so further mobility deferred. MD and RN notified.    Transfers                        Ambulation/Gait                  Stairs            Wheelchair Mobility    Modified Rankin (Stroke Patients Only)       Balance                                             Pertinent Vitals/Pain Pain Assessment Pain Assessment: Faces Faces Pain Scale: Hurts whole lot Pain Location: R knee with movement Pain Descriptors / Indicators: Grimacing, Guarding, Moaning Pain Intervention(s): Limited activity within patient's tolerance, Monitored during session, Repositioned    Home Living Family/patient expects to be discharged to:: Private residence Living Arrangements: Alone Available Help at Discharge: Personal care attendant (7 days/week, 2-3 hours/day) Type of Home: House Home Access: Ramped entrance       Home Layout: One level Home  Equipment: Wheelchair - manual;Grab bars - toilet;Toilet riser;Shower seat      Prior Function Prior Level of Function : Needs assist             Mobility Comments: Uses WC at baseline. Does ambulate very short distance into bathroom. ADLs Comments: Needs assist with bathing/dressing     Hand Dominance        Extremity/Trunk Assessment   Upper Extremity Assessment Upper Extremity Assessment: Defer to OT evaluation    Lower Extremity Assessment Lower Extremity Assessment: RLE deficits/detail;Generalized  weakness RLE Deficits / Details: Limited ROM at R knee. Also felt some instability with movement; RN and MD notified.    Cervical / Trunk Assessment Cervical / Trunk Assessment: Other exceptions Cervical / Trunk Exceptions: increased body habitus  Communication   Communication: No difficulties  Cognition Arousal/Alertness: Awake/alert Behavior During Therapy: WFL for tasks assessed/performed Overall Cognitive Status: No family/caregiver present to determine baseline cognitive functioning                                          General Comments      Exercises General Exercises - Lower Extremity Ankle Circles/Pumps: AROM, Both, 20 reps Heel Slides:  (verbally educated to perform on LLE)   Assessment/Plan    PT Assessment Patient needs continued PT services  PT Problem List Decreased strength;Decreased mobility;Decreased balance;Decreased activity tolerance;Decreased knowledge of use of DME;Decreased knowledge of precautions       PT Treatment Interventions DME instruction;Functional mobility training;Gait training;Therapeutic activities;Therapeutic exercise;Balance training;Patient/family education    PT Goals (Current goals can be found in the Care Plan section)  Acute Rehab PT Goals Patient Stated Goal: to decrease pain PT Goal Formulation: With patient Time For Goal Achievement: 09/14/21 Potential to Achieve Goals: Good    Frequency Min 2X/week     Co-evaluation               AM-PAC PT "6 Clicks" Mobility  Outcome Measure Help needed turning from your back to your side while in a flat bed without using bedrails?: A Lot Help needed moving from lying on your back to sitting on the side of a flat bed without using bedrails?: A Lot Help needed moving to and from a bed to a chair (including a wheelchair)?: Total Help needed standing up from a chair using your arms (e.g., wheelchair or bedside chair)?: Total Help needed to walk in hospital room?:  Total Help needed climbing 3-5 steps with a railing? : Total 6 Click Score: 8    End of Session   Activity Tolerance: Patient limited by pain Patient left: in bed;with call bell/phone within reach;Other (comment) (on stretcher with transport staff present) Nurse Communication: Mobility status PT Visit Diagnosis: Other abnormalities of gait and mobility (R26.89);History of falling (Z91.81);Muscle weakness (generalized) (M62.81);Difficulty in walking, not elsewhere classified (R26.2);Pain Pain - Right/Left: Right Pain - part of body: Knee    Time: 2229-7989 PT Time Calculation (min) (ACUTE ONLY): 26 min   Charges:   PT Evaluation $PT Eval Moderate Complexity: 1 Mod PT Treatments $Therapeutic Activity: 8-22 mins        Lou Miner, DPT  Acute Rehabilitation Services  Office: [phone]   Rudean Hitt 08/31/2021, 1:06 PM

## 2021-08-31 NOTE — H&P (Signed)
History and Physical    Patient: [name] QJJ:[id number] DOB: [birth year] DOA: 08/31/2021 DOS: the patient was seen and examined on 08/31/2021 PCP: Fredrich Romans, [place]  Patient coming from: Home - lives alone, has a caregiver 3h/day; NOK: None   Chief Complaint: Falls  HPI: [name] is a 66 y.o. female with medical history significant of remote breast CA; morbid obesity;  chronic back pain; stage 3 CKD; HTN; HLD; hypothyroidism; OSA on CPAP; DM; and schizophrenia presenting with falls.  She reports that she has a right prior knee surgery that has failed and cannot be surgically repaired as well as a right foot ligamentous injury that cannot be repaired.  Her right leg has been giving out when she tries to walk from bathroom to chair - she is consistently one step short before her leg gives out.  EMS has been out to help her up multiple times in the last few weeks and brought her to the ER today.  She is eating and drinking well and otherwise has no complaints.  She wears CPAP at home with sleep and when she feels SOB but this is no different from baseline.    ER Course:  Weakness, falls, couldn't get up off the floor.  +urinary frequency.  K+ 3.1.  ?AKI - creatinine 1.4, 1.15 in January.  Hgb 9.4, down from 11.2.  CK minimally elevated.  Mag++ 1.8, giving Mag++.  UA WNL.  CXR negative, ?bronchitis (none clinically).  Lung nodule - needs chest CT.       Review of Systems: As mentioned in the history of present illness. All other systems reviewed and are negative. 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   partial   benign cyst  Left    removed from left breast   BREAST BIOPSY Right 02/13/2012   BREAST LUMPECTOMY  03/03/2012   Procedure: LUMPECTOMY;  Surgeon: Haywood Lasso, MD;  Location: [facility];  Service: General;  Laterality: Right;   BREAST SURGERY Right 01/2012   "cancer"   CHOLECYSTECTOMY  1980's?   COLONOSCOPY WITH PROPOFOL N/A 09/28/2012   Procedure: COLONOSCOPY WITH PROPOFOL;  Surgeon: Lear Ng, MD;  Location: [facility];  Service: Endoscopy;  Laterality: N/A;   FOOT FRACTURE SURGERY Right 1990's   JOINT REPLACEMENT      x 3   LESION EXCISION N/A 03/13/2021   Procedure: EXCISION SCALP SEBACEOUS CYSTS x2;  Surgeon:  Ralene Ok, MD;  Location: [facility];  Service: General;  Laterality: N/A;  60 MINUTES LOCAL & MAC   LUMBAR LAMINECTOMY/DECOMPRESSION MICRODISCECTOMY N/A 12/05/2016   Procedure: LEFT L5-S1 MICRODISCECTOMY;  Surgeon: Marybelle Killings, MD;  Location: [facility];  Service: Orthopedics;  Laterality: N/A;   REVISION TOTAL KNEE ARTHROPLASTY  07/2009   SHOULDER OPEN ROTATOR CUFF REPAIR Left 1990's   THYROIDECTOMY  1970's   TOTAL KNEE ARTHROPLASTY Bilateral 2009-06/2009   left; right   Social History:  reports that she quit smoking about 29 years ago. Her smoking use included cigarettes. She has a 7.00 pack-year smoking history. She has never used smokeless tobacco. She reports that she does not drink alcohol and does not use drugs.  Allergies  Allergen Reactions   Bee  Venom Anaphylaxis   Invokana [Canagliflozin] Anaphylaxis    Dyspnea and urinary retention   Shellfish Allergy Anaphylaxis   Buprenorphine Hcl Other (See Comments)    Overly sedated with morphine drip.   Other Other (See Comments)    Cats - bad asthma, stopped up    Family History  Problem Relation Age of Onset   Heart disease Brother        Multiple MIs, starting in his 54s   Diabetes Brother    Thyroid disease Brother    Hypertension Mother    Diabetes Mother    Breast cancer Mother 55   Bone cancer Mother    Hypertension Sister    Diabetes Sister    Breast cancer Sister 53   Thyroid disease Sister    Breast cancer Maternal Grandmother    Heart disease Maternal Grandmother    Uterine cancer Other 19   Breast cancer Paternal Aunt 42   Breast cancer Paternal Grandmother        dx in her 22s   Prostate cancer Paternal Grandfather    Asthma Son     Prior to Admission medications   Medication Sig Start Date End Date Taking? Authorizing Provider  acetaminophen (TYLENOL) 650 MG CR tablet Take 1,300 mg by mouth every 8 (eight) hours as needed for pain.    [provider]  albuterol (VENTOLIN HFA) 108 (90 Base) MCG/ACT inhaler Inhale 2 puffs into the lungs every 4 (four) hours as needed for wheezing or shortness of breath.     [provider]  amLODipine (NORVASC) 10 MG tablet Take 1 tablet (10 mg total) by mouth daily. 10/07/20   Shawna Clamp, MD  Ascorbic Acid (VITAMIN C PO) Take 1 tablet by mouth in the morning.    [provider]  aspirin EC 81 MG tablet Take 1 tablet (81 mg total) by mouth daily. 08/12/13   Viyuoh, Alison Stalling, MD  BELBUCA 300 MCG FILM Take 300 mcg by mouth 2 (two) times daily. 01/09/21   [provider]  belladonna-opium (B&O SUPPRETTES) 16.2-30 MG suppository Place 1 suppository rectally 2 (two) times daily as needed (for bladder spasms). 11/14/20   Molpus, John, MD  calcitRIOL (ROCALTROL) 0.25 MCG capsule TAKE ONE CAPSULE BY  MOUTH three times PER WEEK 06/17/21   Renato Shin, MD  Cholecalciferol (VITAMIN D-3) 1000 units CAPS Take 1,000 Units by mouth daily.    [provider]  Cyanocobalamin (VITAMIN B-12 PO) Take 1 tablet by mouth daily.    [provider]  cyclobenzaprine (FLEXERIL) 10 MG tablet Take 10 mg by mouth at bedtime. 08/31/20   [provider]  doxepin (SINEQUAN) 50 MG capsule Take 100 mg by mouth at  bedtime. 09/24/20   [provider]  esomeprazole (NEXIUM) 40 MG capsule Take 1 capsule (40 mg total) by mouth daily before breakfast. 10/30/14   Barton Dubois, MD  famotidine (PEPCID) 40 MG tablet Take 40 mg by mouth daily after supper. 06/16/20   [provider]  ferrous sulfate 325 (65 FE) MG tablet Take 325 mg by mouth in the morning.    [provider]  furosemide (LASIX) 20 MG tablet Take 2 tablets (40 mg total) by mouth daily. Patient taking differently: Take 60 mg by mouth in the morning. 09/09/19   Renato Shin, MD  Insulin NPH, Human,, Isophane, (HUMULIN N KWIKPEN) 100 UNIT/ML Kiwkpen Inject 55 Units into the skin every morning. And pen needles 1/day 04/26/21   Renato Shin, MD  Lancets [facility] DELICA PLUS [id number]) [place] 1 each by Other route 3 (three) times daily. Use to check blood sugar 3 times a day 05/13/21   Renato Shin, MD  levothyroxine (SYNTHROID) 200 MCG tablet Take 1 tablet (200 mcg total) by mouth daily before breakfast. 01/30/21   Renato Shin, MD  linaclotide [facility]) 290 MCG CAPS capsule Take 290 mcg by mouth daily before breakfast.    [provider]  LORazepam (ATIVAN) 0.5 MG tablet Take 1 tablet (0.5 mg total) by mouth every 8 (eight) hours as needed for anxiety. 10/06/20   Shawna Clamp, MD  LORazepam (ATIVAN) 1 MG tablet Take 1 mg by mouth in the morning, at noon, and at bedtime.    [provider]  losartan-hydrochlorothiazide (HYZAAR) 100-25 MG tablet Take 1 tablet by mouth in the morning. 09/17/20   [provider]  lubiprostone (AMITIZA) 24 MCG capsule Take 24 mcg by mouth 2 (two) times daily. 06/22/20   [provider]  methocarbamol (ROBAXIN) 750 MG tablet Take 750 mg by mouth 3 (three) times daily. 08/31/20   [provider]  mirabegron ER (MYRBETRIQ) 25 MG TB24 tablet Take 25 mg by mouth in the morning.    [provider]  naloxone [facility]) nasal spray 4 mg/0.1 mL Place 1 spray into the nose as needed for opioid reversal. 08/01/20   [provider]  [facility] VERIO test strip TEST BLOOD SUGAR THREE TIMES DAILY 03/08/21   Renato Shin, MD  penicillin v potassium (VEETID) 500 MG tablet Take 500 mg by mouth 4 (four) times daily.    [provider]  polyvinyl alcohol (LIQUIFILM TEARS) 1.4 % ophthalmic solution 1 drop as needed for dry eyes.    [provider]  potassium chloride SA (KLOR-CON) 20 MEQ tablet Take 20 mEq by mouth daily. 06/22/20   [provider]  pregabalin (LYRICA) 300 MG capsule Take 300 mg by mouth 2 (two) times daily.    [provider]  rosuvastatin (CRESTOR) 20 MG tablet Take 20 mg by mouth daily.    [provider]  sucralfate (CARAFATE) 1 g tablet Take 1 g by mouth in the morning. 08/13/20   [provider]  SURE COMFORT PEN NEEDLES 32G X 6 MM MISC USE TO INJECT INSULIN FOUR TIMES DAILY 03/13/21   Renato Shin, MD  tolterodine (DETROL LA) 2 MG 24 hr capsule Take 2 mg by mouth every evening. 09/24/20   [provider]  traMADol (ULTRAM) 50 MG tablet Take 1 tablet (50 mg total) by mouth every 6 (six) hours as needed. 03/13/21 03/13/22  Ralene Ok, MD  TRULICITY 4.5 AS/5.0NL SOPN Inject 4.5 mg into the skin once a week. Patient taking differently:  Inject 4.5 mg into the skin every Saturday. 09/06/20   Renato Shin, MD  valACYclovir (VALTREX) 1000 MG tablet Take 1,000 mg by mouth daily.    [provider]  venlafaxine XR (EFFEXOR-XR) 150 MG 24 hr capsule Take 150 mg by mouth  2 (two) times daily. 05/21/15   [provider]  Vitamin D, Ergocalciferol, (DRISDOL) 1.25 MG (50000 UNIT) CAPS capsule Take 1 capsule (50,000 Units total) by mouth every 14 (fourteen) days. 04/29/21   Renato Shin, MD  ziprasidone (GEODON) 60 MG capsule Take 60 mg by mouth daily after supper.    [provider]    Physical Exam: Vitals:   08/31/21 1101 08/31/21 1103 08/31/21 1105 08/31/21 1111  BP:      Pulse: 94 93 88 93  Resp: 14 (!) _0 Temp:      TempSrc:      SpO2: (!) 84% (!) 85% (!) 82% 90%  Weight:      Height:       General:  Appears calm and comfortable and is in NAD, very sedentary Eyes:  EOMI, normal lids, iris ENT:  grossly normal hearing, lips & tongue, mildly dry mm Neck:  no LAD, masses or thyromegaly Cardiovascular:  RRR, no m/r/g. No pitting LE edema (chronic B lymphedema).  Respiratory:   CTA bilaterally with no wheezes/rales/rhonchi.  Normal respiratory effort. Abdomen:  soft, mild diffuse abdominal TTP, ND Skin:  no rash or induration seen on limited exam Musculoskeletal:  mildly decreased tone BUE/BLE, no bony abnormality Psychiatric:  blunted mood and affect, speech fluent and appropriate, AOx3 Neurologic:  CN 2-12 grossly intact, moves all extremities in coordinated fashion   Radiological Exams on Admission: Independently reviewed - see discussion in A/P where applicable  DG Chest Port 1 View  Result Date: 08/31/2021 CLINICAL DATA:  66 year old female with history of weakness and shortness of breath. EXAM: PORTABLE CHEST 1 VIEW COMPARISON:  Chest x-ray 09/30/2010. FINDINGS: Lung volumes are normal. Patchy areas of interstitial prominence and peribronchial cuffing noted throughout the mid to lower lung bilaterally, where there also several ill-defined opacities, most notably in the left mid lung where there is a ill-defined nodular density which is slightly more prominent than the prior chest x-ray. No pleural effusions. No  pneumothorax. Cephalization of the pulmonary vasculature. Heart size is mildly enlarged. Upper mediastinal contours are within normal limits. Surgical clips project over the lower right cervical region, likely from prior thyroidectomy. IMPRESSION: 1. Increasingly conspicuous nodular density in the left mid lung concerning for potential neoplasm. Further evaluation with nonemergent chest CT is recommended in the near future to better evaluate this finding. 2. Diffuse bronchial wall thickening and peribronchial cuffing, which could indicate an acute bronchitis. 3. Cardiomegaly. Electronically Signed   By: Vinnie Langton M.D.   On: 08/31/2021 05:58    EKG: Independently reviewed.  NSR with rate 91; nonspecific ST changes with no evidence of acute ischemia   Labs on Admission: I have personally reviewed the available labs and imaging studies at the time of the admission.  Pertinent labs:    K+ 3.1 Glucose 143 BUN 24/Creatinine 1.43/GFR 41 - stable CK 277 WBC 5.8 Hgb 9.4 UA WNL A1c 9.6 on 3/10   Assessment and Plan: Principal Problem:   Ambulatory dysfunction Active Problems:   Hypothyroidism   HLD (hyperlipidemia)   Essential hypertension   Recurrent falls   Type II diabetes mellitus (HCC)   OSA on CPAP   CKD (chronic kidney disease) stage  3, GFR 30-59 ml/min (HCC)   Obesity, morbid, BMI 50 or higher (HCC)   Chronic pain syndrome   Schizophrenia ([place])   Hypoxia    Ambulatory dysfunction -Patient with reported joint dysfunction of R knee and ligamentous injury of R foot - both creating gait instability -She has had recurrent falls requiring EMS support -She is here for this issue today, without other complaints - but was found to be hypoxic (see below) -Will observe for now on med surg -PT/OT consults -TOC team consult -She appears to be unsafe living independently  Hypoxia -Dropped to 82% while eating, consistently in high 80s with eating -She is currently on 3L [place]  O2 -No known h/o hypoxia but she does wear CPAP at home with sleep and when she feels SOB -Suspect that this is related to OHS -Will observe on continuous pulse ox -TOC consult for probable home O2 -CXR with lung nodule concerning for malignancy (CT pending), which may also be a factor -CXR also with ?bronchitis but she does not report respiratory symptoms so this seems less likely -Continue Albuterol for asthma vs. COPD vs. OHS  Chronic pain -I have reviewed this patient in the [place] Controlled Substances Reporting System.  He is receiving medications from only one provider and appears to be taking them as prescribed. -She is at very high risk of opioid misuse, diversion, or overdose.  -Continue Belbuca, tramadol, Lyrica, cyclobenzaprine (qhs)/methocarbamol (TID); will not add additional opiates at this time  Schizophrenia -Continue home mood meds - doxepin, Ativan, Effexor, Geodon   HTN -Hold amlodipine and Hyzaar for now due to low-normal BP  HLD -Continue Crestor  Hypothyroidism -Continue Synthroid  OSA -Continue CPAP  DM -Recent A1c was 9.6, poor control -Continue NPH -hold Trulicity -Cover with resistant-scale SSI   Lung nodule -CT ordered for further evaluation  Stage 3b CKD -Appears to be stable -Hold diuretics for now -Recheck in AM -Continue Calcitriol (change to daily)  Morbid obesity -Body mass index is 53.25 kg/m..  -Weight loss should be encouraged -Outpatient PCP/bariatric medicine f/u encouraged  -This is very likely to contribute to presenting condition and increases her overall morbidity/mortality risk    Advance Care Planning:   Code Status: Full Code   Consults: PT/OT; TOC team; nutrition  DVT Prophylaxis: Lovenox  Family Communication: None present; she declined to have me contact family at the time of admission  Severity of Illness: The appropriate patient status for this patient is OBSERVATION. Observation status is judged to be  reasonable and necessary in order to provide the required intensity of service to ensure the patient's safety. The patient's presenting symptoms, physical exam findings, and initial radiographic and laboratory data in the context of their medical condition is felt to place them at decreased risk for further clinical deterioration. Furthermore, it is anticipated that the patient will be medically stable for discharge from the hospital within 2 midnights of admission.   Author: Karmen Bongo, MD 08/31/2021 11:51 AM  For on call review [email or web address].

## 2021-08-31 NOTE — ED Notes (Signed)
Sats dropped as low as 82% while eating, maintained mid to high 80s, MD Yates made aware. 3LPM replaced to pt.

## 2021-09-01 DIAGNOSIS — I10 Essential (primary) hypertension: Secondary | ICD-10-CM

## 2021-09-01 DIAGNOSIS — G894 Chronic pain syndrome: Secondary | ICD-10-CM | POA: Diagnosis not present

## 2021-09-01 DIAGNOSIS — R262 Difficulty in walking, not elsewhere classified: Secondary | ICD-10-CM | POA: Diagnosis not present

## 2021-09-01 LAB — CBC
HCT: 27.1 % — ABNORMAL LOW (ref 36.0–46.0)
Hemoglobin: 8.7 g/dL — ABNORMAL LOW (ref 12.0–15.0)
MCH: 28.3 pg (ref 26.0–34.0)
MCHC: 32.1 g/dL (ref 30.0–36.0)
MCV: 88.3 fL (ref 80.0–100.0)
Platelets: 206 10*3/uL (ref 150–400)
RBC: 3.07 MIL/uL — ABNORMAL LOW (ref 3.87–5.11)
RDW: 16.4 % — ABNORMAL HIGH (ref 11.5–15.5)
WBC: 6.4 10*3/uL (ref 4.0–10.5)
nRBC: 0 % (ref 0.0–0.2)

## 2021-09-01 LAB — BASIC METABOLIC PANEL
Anion gap: 11 (ref 5–15)
BUN: 16 mg/dL (ref 8–23)
CO2: 28 mmol/L (ref 22–32)
Calcium: 9 mg/dL (ref 8.9–10.3)
Chloride: 102 mmol/L (ref 98–111)
Creatinine, Ser: 1.14 mg/dL — ABNORMAL HIGH (ref 0.44–1.00)
GFR, Estimated: 53 mL/min — ABNORMAL LOW (ref >60)
Glucose, Bld: 120 mg/dL — ABNORMAL HIGH (ref 70–99)
Potassium: 3.3 mmol/L — ABNORMAL LOW (ref 3.5–5.1)
Sodium: 141 mmol/L (ref 135–145)

## 2021-09-01 LAB — CBG MONITORING, ED
Glucose-Capillary: 123 mg/dL — ABNORMAL HIGH (ref 70–99)
Glucose-Capillary: 109 mg/dL — ABNORMAL HIGH (ref 70–99)

## 2021-09-01 LAB — GLUCOSE, CAPILLARY: Glucose-Capillary: 106 mg/dL — ABNORMAL HIGH (ref 70–99)

## 2021-09-01 MED ORDER — FUROSEMIDE 10 MG/ML IJ SOLN
40.0000 mg | Freq: Two times a day (BID) | INTRAMUSCULAR | Status: DC
  Administered 2021-09-01 – 2021-09-04 (×6): 40 mg via INTRAVENOUS
  Filled 2021-09-01 (×6): qty 4

## 2021-09-01 MED ORDER — POTASSIUM CHLORIDE CRYS ER 20 MEQ PO TBCR
60.0000 meq | EXTENDED_RELEASE_TABLET | Freq: Once | ORAL | Status: AC
  Administered 2021-09-01: 60 meq via ORAL
  Filled 2021-09-01: qty 3

## 2021-09-01 NOTE — Care Management Obs Status (Signed)
[place] NOTIFICATION   Patient Details  Name: [name] MRN: [id number] Date of Birth: Dec 20, 1955   Medicare Observation Status Notification Given:  Yes  Verbal permission to sign  Verdell Carmine, RN 09/01/2021, 12:36 PM

## 2021-09-01 NOTE — Progress Notes (Signed)
Patient refuse CPAP for the night, stating the mask is uncomfortably that we have. Patient states uses nasal pillows at home. Unfortunately the hospital does not provide nasal pillows. Patient is stable at this time. Inform patient to notify staff if changes her mind about using CPAP for the night. Patient verbalized understanding   Marcellis Frampton L. Tamala Julian, BS, RRT-ACCS, RCP

## 2021-09-01 NOTE — ED Notes (Signed)
Pt resting, snoring softly. NAD

## 2021-09-01 NOTE — ED Notes (Signed)
Patient resting. NAD.
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
## 2021-09-01 NOTE — Progress Notes (Signed)
Progress Note   Patient: [name] [id number]:[id number] DOB: [birth year] DOA: 08/31/2021     0 DOS: the patient was seen and examined on 09/01/2021   Brief hospital course: 66 y.o. female with medical history significant of remote breast CA; morbid obesity;  chronic back pain; stage 3 CKD; HTN; HLD; hypothyroidism; OSA on CPAP; DM; and schizophrenia presenting with falls.  She reports that she has a right prior knee surgery that has failed and cannot be surgically repaired as well as a right foot ligamentous injury that cannot be repaired.  Her right leg has been giving out when she tries to walk from bathroom to chair - she is consistently one step short before her leg gives out.  EMS has been out to help her up multiple times in the last few weeks and brought her to the ER  On further questioning, pt reports her primary complaint had been increasing LE swelling and weakness. Pt at baseline mobilizes via electric wheelchair and ambulates only to get to the bathroom.  Assessment and Plan: Ambulatory dysfunction -Patient with reported joint dysfunction of R knee and ligamentous injury of R foot - both creating gait instability -She has had recurrent falls requiring EMS support -PT/OT recs for SNF   Hypoxia -Dropped to 82% while eating, consistently in high 80s with eating -She is currently on 3L [place] O2 -Cont CPAP at night -Initial chest imaging with concerns of possible bronchitis -F/u CT chest pending results. Of note, enlarging nodule noted. Will f/u on CT -Pt with complaints of worsening LE edema. Will give trial of IV lasix   Chronic pain -I have reviewed this patient in the [place] Controlled Substances Reporting System.  He is receiving medications from only one provider and appears to be taking them as prescribed. -She is at very high risk of opioid misuse, diversion, or overdose.  -Continue Belbuca, tramadol, Lyrica, cyclobenzaprine (qhs)/methocarbamol (TID); will not add additional  opiates at this time   Schizophrenia -Continue home mood meds - doxepin, Ativan, Effexor, Geodon    HTN -Hold amlodipine and Hyzaar for now due to low-normal BP   HLD -Continue Crestor   Hypothyroidism -Continue Synthroid   OSA -Continue CPAP   DM -Recent A1c was 9.6, poor control -Continue NPH -hold Trulicity -Cover with resistant-scale SSI    Lung nodule -CT ordered for further evaluation -Pending results of CT   Stage 3b CKD -Appears to be stable -LE edema noted -Start on trial of IV lasix -Recheck bmet in AM   Morbid obesity -Body mass index is 53.25 kg/m..  -Weight loss should be encouraged -Outpatient PCP/bariatric medicine f/u encouraged  -This is very likely to contribute to presenting condition and increases her overall morbidity/mortality risk      Subjective: Complaining of LE weakness and swelling  Physical Exam: Vitals:   09/01/21 1245 09/01/21 1315 09/01/21 1345 09/01/21 1415  BP: 111/73     Pulse: 91 94 92 91  Resp: (!) 30 (!) _0 Temp:      TempSrc:      SpO2: 93% 94% 94% 92%  Weight:      Height:       General exam: Awake, laying in bed, in nad Respiratory system: Normal respiratory effort, no wheezing Cardiovascular system: regular rate, s1, s2 Gastrointestinal system: Soft, nondistended, positive BS Central nervous system: CN2-12 grossly intact, strength intact Extremities: Perfused, no clubbing, BLE edema Skin: Normal skin turgor, no notable skin lesions seen Psychiatry: Mood normal // no  visual hallucinations   Data Reviewed:  Labs reviewed: Cr 1.14, K 3.3, Hgb 8.7    Family Communication: Pt in room, family not at be  Disposition: Status is: Observation The patient remains OBS appropriate and will d/c before 2 midnights.  Planned Discharge Destination: Skilled nursing facility    Author: Marylu Lund, MD 09/01/2021 2:53 PM  For on call review [email or web address].

## 2021-09-01 NOTE — Care Management (Signed)
SATURATION QUALIFICATIONS: (This note is used to comply with regulatory documentation for home oxygen)  Patient Saturations on Room Air at Rest = 82%  Patient Saturations on Room Air while Ambulating = 82% cannot ambulate  Patient Saturations on 3 Liters of oxygen while Ambulating = 95% ( lying cannot ambulate)  Please briefly explain why patient needs home oxygen: desaturating on room air

## 2021-09-01 NOTE — ED Notes (Signed)
Doctor at bedside.

## 2021-09-01 NOTE — TOC Initial Note (Addendum)
Transition of Care [facility]) - Initial/Assessment Note    Patient Details  Name: [name] MRN: [id number] Date of Birth: Apr 06, 1955  Transition of Care [facility]) CM/SW Contact:    Verdell Carmine, RN Phone Number: 09/01/2021, 12:47 PM  Clinical Narrative:                  Pateint lives alone in a house. She has PCS services 3 hours a day 7 days a week. She mobilizes in a wheelchair due to foot pain. PT recommended SNF, however patient is refusing this. Has been at [facility] prior.[place] an agency that works with her insurance.  She has a wheelchair at home but needs a bedside commode. Does not feel a walker would be helpful to her.   Will place order for [facility] for acceptance.- they cannot staff as recommendation is SNF Faxed to [facility] [phone] PT OT notes TOC note.  CM will follow for needs recommendations, and transitions of care Expected Discharge Plan: [place] Barriers to Discharge: Continued Medical Work up   Patient Goals and CMS Choice     Choice offered to / list presented to : Patient  Expected Discharge Plan and Services Expected Discharge Plan: [place]   Discharge Planning Services: CM Consult Post Acute Care Choice: Durable Medical Equipment, Home Health Living arrangements for the past 2 months: Single Family Home                           HH Arranged: PT, OT, Refused SNF          Prior Living Arrangements/Services Living arrangements for the past 2 months: Single Family Home Lives with:: Self Patient language and need for interpreter reviewed:: Yes Do you feel safe going back to the place where you live?: Yes      Need for Family Participation in Patient Care: Yes (Comment) Care giver support system in place?: Yes (comment) Current home services: DME, Homehealth aide (wheelchair, PCS services 3 hours a day) Criminal Activity/Legal Involvement Pertinent to Current  Situation/Hospitalization: No - Comment as needed  Activities of Daily Living      Permission Sought/Granted                  Emotional Assessment   Attitude/Demeanor/Rapport: Gracious Affect (typically observed): Stable Orientation: : Oriented to Self, Oriented to Place, Oriented to  Time, Oriented to Situation Alcohol / Substance Use: Not Applicable Psych Involvement: No (comment)  Admission diagnosis:  Generalized weakness [R53.1] Patient Active Problem List   Diagnosis Date Noted   Generalized weakness 08/31/2021   Ambulatory dysfunction 08/31/2021   Hypoxia 08/31/2021   AMS (altered mental status) 09/29/2020   Hyperparathyroidism, secondary ([place]) 03/23/2018   Memory difficulty 12/22/2017   Spinal stenosis 06/09/2017   HNP (herniated nucleus pulposus), lumbar 12/05/2016   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   Diabetic neuropathy ([place]) 09/08/2016   Hypocalcemia 08/22/2016   Abnormal barium swallow 07/07/2016   Anxiety 07/07/2016   History of malignant neoplasm of breast 07/07/2016   Gait abnormality 05/29/2016   Encephalopathy acute 04/23/2016   Hyponatremia 04/23/2016   Schizophrenia, paranoid ([place])    Aspiration pneumonia of left lower lobe due to gastric secretions (HCC)    Projectile vomiting with nausea    Altered mental status    Schizophrenia ([place])    Disorganized schizophrenia ([place])  Chronic pain syndrome    Acute renal failure (HCC)    Controlled diabetes mellitus type 2 with complications (HCC)    Acute encephalopathy    Oropharyngeal dysphagia    Aspiration pneumonia of right lower lobe due to vomit ([place])    Acute renal failure (ARF) ([place]) 03/28/2016   Encephalopathy 03/27/2016   HCAP (healthcare-associated pneumonia) 02/29/2016   Pneumonia 02/14/2016   CAP (community acquired pneumonia) 02/13/2016   Edema 02/13/2016   AKI (acute kidney injury) ([place]) 02/13/2016   Elevated troponin  06/24/2015   Diarrhea 06/23/2015   Sepsis ([place]) 06/23/2015   Obesity, morbid, BMI 50 or higher ([place]) 03/26/2015   Post traumatic stress disorder (PTSD) 11/14/2014   Leukocytosis 11/12/2014   Slurred speech 11/12/2014   CKD (chronic kidney disease) stage 3, GFR 30-59 ml/min (HCC)    Diabetes mellitus, type II, insulin dependent ([place])    TIA (transient ischemic attack)    Hypotension 10/28/2014   Syncope 10/28/2014   Anemia 10/28/2014   Type II diabetes mellitus ([place]) 10/28/2014   OSA on CPAP 10/28/2014   Chronic lower back pain 10/28/2014   Polyneuropathy in diabetes(357.2) 10/28/2014   Diabetic polyneuropathy ([place])    Fall 08/14/2014   Recurrent falls 08/14/2014   Hot flashes 02/24/2014   Arthralgia 02/24/2014   Hair loss 02/24/2014   Chest pain 01/06/2014   Shock ([place]) 08/08/2013   Secondary parkinsonism ([place]) 12/30/2012   Dysarthria 12/30/2012   Benign paroxysmal positional vertigo 12/30/2012   Breast cancer of upper-outer quadrant of right female breast ([place]) 11/18/2012   Hemorrhage of rectum and anus 09/28/2012   Hx of radiation therapy    Syncope and collapse 06/04/2011   HSV 10/05/2008   Mild persistent asthma 06/02/2008   Dyspnea 07/19/2007   HLD (hyperlipidemia) 06/02/2006   Sinus tachycardia 05/25/2006   Hypothyroidism 02/27/2006   Depression 02/27/2006   Essential hypertension 02/27/2006   GERD 02/27/2006   KNEE PAIN 02/27/2006   PCP:  Fredrich Romans, [place] Pharmacy:   [place], [place] [address] [place] [address] Phone: [phone] Fax: [phone]     Social Determinants of Health (SDOH) Interventions    Readmission Risk Interventions     No data to display

## 2021-09-01 NOTE — Hospital Course (Signed)
66 y.o. female with medical history significant of remote breast CA; morbid obesity;  chronic back pain; stage 3 CKD; HTN; HLD; hypothyroidism; OSA on CPAP; DM; and schizophrenia presenting with falls.  She reports that she has a right prior knee surgery that has failed and cannot be surgically repaired as well as a right foot ligamentous injury that cannot be repaired.  Her right leg has been giving out when she tries to walk from bathroom to chair - she is consistently one step short before her leg gives out.  EMS has been out to help her up multiple times in the last few weeks and brought her to the ER  On further questioning, pt reports her primary complaint had been increasing LE swelling and weakness. Pt at baseline mobilizes via electric wheelchair and ambulates only to get to the bathroom.

## 2021-09-01 NOTE — ED Notes (Signed)
Patient awake watching TV. NAD.

## 2021-09-01 NOTE — ED Notes (Signed)
OT at bedside. 

## 2021-09-02 ENCOUNTER — Observation Stay (HOSPITAL_COMMUNITY): Payer: Medicare Other

## 2021-09-02 DIAGNOSIS — Z6841 Body Mass Index (BMI) 40.0 and over, adult: Secondary | ICD-10-CM | POA: Diagnosis not present

## 2021-09-02 DIAGNOSIS — R296 Repeated falls: Secondary | ICD-10-CM | POA: Diagnosis present

## 2021-09-02 DIAGNOSIS — F209 Schizophrenia, unspecified: Secondary | ICD-10-CM | POA: Diagnosis present

## 2021-09-02 DIAGNOSIS — R262 Difficulty in walking, not elsewhere classified: Secondary | ICD-10-CM | POA: Diagnosis not present

## 2021-09-02 DIAGNOSIS — E890 Postprocedural hypothyroidism: Secondary | ICD-10-CM | POA: Diagnosis present

## 2021-09-02 DIAGNOSIS — I5031 Acute diastolic (congestive) heart failure: Secondary | ICD-10-CM | POA: Diagnosis not present

## 2021-09-02 DIAGNOSIS — I5032 Chronic diastolic (congestive) heart failure: Secondary | ICD-10-CM | POA: Diagnosis present

## 2021-09-02 DIAGNOSIS — N1832 Chronic kidney disease, stage 3b: Secondary | ICD-10-CM | POA: Diagnosis present

## 2021-09-02 DIAGNOSIS — R2689 Other abnormalities of gait and mobility: Secondary | ICD-10-CM | POA: Diagnosis present

## 2021-09-02 DIAGNOSIS — E876 Hypokalemia: Secondary | ICD-10-CM | POA: Diagnosis present

## 2021-09-02 DIAGNOSIS — G894 Chronic pain syndrome: Secondary | ICD-10-CM | POA: Diagnosis present

## 2021-09-02 DIAGNOSIS — F32A Depression, unspecified: Secondary | ICD-10-CM | POA: Diagnosis present

## 2021-09-02 DIAGNOSIS — E1142 Type 2 diabetes mellitus with diabetic polyneuropathy: Secondary | ICD-10-CM | POA: Diagnosis present

## 2021-09-02 DIAGNOSIS — G219 Secondary parkinsonism, unspecified: Secondary | ICD-10-CM | POA: Diagnosis present

## 2021-09-02 DIAGNOSIS — E785 Hyperlipidemia, unspecified: Secondary | ICD-10-CM | POA: Diagnosis present

## 2021-09-02 DIAGNOSIS — B009 Herpesviral infection, unspecified: Secondary | ICD-10-CM | POA: Diagnosis present

## 2021-09-02 DIAGNOSIS — E662 Morbid (severe) obesity with alveolar hypoventilation: Secondary | ICD-10-CM | POA: Diagnosis present

## 2021-09-02 DIAGNOSIS — I2723 Pulmonary hypertension due to lung diseases and hypoxia: Secondary | ICD-10-CM | POA: Diagnosis present

## 2021-09-02 DIAGNOSIS — I130 Hypertensive heart and chronic kidney disease with heart failure and stage 1 through stage 4 chronic kidney disease, or unspecified chronic kidney disease: Secondary | ICD-10-CM | POA: Diagnosis present

## 2021-09-02 DIAGNOSIS — D631 Anemia in chronic kidney disease: Secondary | ICD-10-CM | POA: Diagnosis present

## 2021-09-02 DIAGNOSIS — K219 Gastro-esophageal reflux disease without esophagitis: Secondary | ICD-10-CM | POA: Diagnosis present

## 2021-09-02 DIAGNOSIS — F419 Anxiety disorder, unspecified: Secondary | ICD-10-CM | POA: Diagnosis present

## 2021-09-02 DIAGNOSIS — N179 Acute kidney failure, unspecified: Secondary | ICD-10-CM | POA: Diagnosis present

## 2021-09-02 DIAGNOSIS — I509 Heart failure, unspecified: Secondary | ICD-10-CM | POA: Insufficient documentation

## 2021-09-02 DIAGNOSIS — J449 Chronic obstructive pulmonary disease, unspecified: Secondary | ICD-10-CM | POA: Diagnosis present

## 2021-09-02 DIAGNOSIS — I10 Essential (primary) hypertension: Secondary | ICD-10-CM | POA: Diagnosis not present

## 2021-09-02 DIAGNOSIS — J9611 Chronic respiratory failure with hypoxia: Secondary | ICD-10-CM | POA: Diagnosis present

## 2021-09-02 DIAGNOSIS — E1122 Type 2 diabetes mellitus with diabetic chronic kidney disease: Secondary | ICD-10-CM | POA: Diagnosis present

## 2021-09-02 LAB — COMPREHENSIVE METABOLIC PANEL
ALT: 19 U/L (ref 0–44)
AST: 17 U/L (ref 15–41)
Albumin: 3.1 g/dL — ABNORMAL LOW (ref 3.5–5.0)
Alkaline Phosphatase: 81 U/L (ref 38–126)
Anion gap: 15 (ref 5–15)
BUN: 16 mg/dL (ref 8–23)
CO2: 29 mmol/L (ref 22–32)
Calcium: 9.1 mg/dL (ref 8.9–10.3)
Chloride: 98 mmol/L (ref 98–111)
Creatinine, Ser: 1.25 mg/dL — ABNORMAL HIGH (ref 0.44–1.00)
GFR, Estimated: 48 mL/min — ABNORMAL LOW (ref >60)
Glucose, Bld: 117 mg/dL — ABNORMAL HIGH (ref 70–99)
Potassium: 3.8 mmol/L (ref 3.5–5.1)
Sodium: 142 mmol/L (ref 135–145)
Total Bilirubin: 0.6 mg/dL (ref 0.3–1.2)
Total Protein: 5.8 g/dL — ABNORMAL LOW (ref 6.5–8.1)

## 2021-09-02 LAB — ECHOCARDIOGRAM COMPLETE
AR max vel: 1.03 cm2
AV Area VTI: 1.07 cm2
AV Area mean vel: 1.04 cm2
AV Mean grad: 23 mmHg
AV Peak grad: 40.4 mmHg
Ao pk vel: 3.18 m/s
Area-P 1/2: 3.99 cm2
Calc EF: 62.7 %
Height: 65 in
Single Plane A2C EF: 74.8 %
Single Plane A4C EF: 51 %
Weight: 5120 oz

## 2021-09-02 LAB — CBC
HCT: 29.1 % — ABNORMAL LOW (ref 36.0–46.0)
Hemoglobin: 9.2 g/dL — ABNORMAL LOW (ref 12.0–15.0)
MCH: 28.1 pg (ref 26.0–34.0)
MCHC: 31.6 g/dL (ref 30.0–36.0)
MCV: 89 fL (ref 80.0–100.0)
Platelets: 204 10*3/uL (ref 150–400)
RBC: 3.27 MIL/uL — ABNORMAL LOW (ref 3.87–5.11)
RDW: 16.2 % — ABNORMAL HIGH (ref 11.5–15.5)
WBC: 8.4 10*3/uL (ref 4.0–10.5)
nRBC: 0 % (ref 0.0–0.2)

## 2021-09-02 LAB — GLUCOSE, CAPILLARY
Glucose-Capillary: 132 mg/dL — ABNORMAL HIGH (ref 70–99)
Glucose-Capillary: 118 mg/dL — ABNORMAL HIGH (ref 70–99)
Glucose-Capillary: 186 mg/dL — ABNORMAL HIGH (ref 70–99)
Glucose-Capillary: 174 mg/dL — ABNORMAL HIGH (ref 70–99)

## 2021-09-02 MED ORDER — ADULT MULTIVITAMIN W/MINERALS CH
1.0000 | ORAL_TABLET | Freq: Every day | ORAL | Status: DC
  Administered 2021-09-02 – 2021-09-13 (×12): 1 via ORAL
  Filled 2021-09-02 (×12): qty 1

## 2021-09-02 MED ORDER — PERFLUTREN LIPID MICROSPHERE
1.0000 mL | INTRAVENOUS | Status: AC | PRN
  Administered 2021-09-02: 3 mL via INTRAVENOUS

## 2021-09-02 NOTE — Progress Notes (Signed)
Echocardiogram 2D Echocardiogram has been performed.  [name] 09/02/2021, 3:09 PM

## 2021-09-02 NOTE — Progress Notes (Signed)
Pt refused to put her cpap on at this time. Explained to her if she changes her mind to have RN call me.

## 2021-09-02 NOTE — Progress Notes (Signed)
Progress Note   Patient: [name] YSA:[id number] DOB: 12-21-1955 DOA: 08/31/2021     0 DOS: the patient was seen and examined on 09/02/2021   Brief hospital course: 66 y.o. female with medical history significant of remote breast CA; morbid obesity;  chronic back pain; stage 3 CKD; HTN; HLD; hypothyroidism; OSA on CPAP; DM; and schizophrenia presenting with falls.  She reports that she has a right prior knee surgery that has failed and cannot be surgically repaired as well as a right foot ligamentous injury that cannot be repaired.  Her right leg has been giving out when she tries to walk from bathroom to chair - she is consistently one step short before her leg gives out.  EMS has been out to help her up multiple times in the last few weeks and brought her to the ER  On further questioning, pt reports her primary complaint had been increasing LE swelling and weakness. Pt at baseline mobilizes via electric wheelchair and ambulates only to get to the bathroom.  Assessment and Plan: Ambulatory dysfunction -Patient with reported joint dysfunction of R knee and ligamentous injury of R foot - both creating gait instability -She has had recurrent falls requiring EMS support -PT/OT recs for SNF   Hypoxia -Dropped to 82% while eating, consistently in high 80s with eating -Weaned to 2L. Pt is O2 naive -Did not tolerate hospital provided cpap -Initial chest imaging with concerns of possible bronchitis, however no significant symptoms -F/u CT chest pending results. Of note, enlarging nodule noted. Will f/u on CT -Presented with complaints of worsened LE edema. Improving with IV lasix, will cont for now   Chronic pain -I have reviewed this patient in the [place] Controlled Substances Reporting System.  He is receiving medications from only one provider and appears to be taking them as prescribed. -She is at very high risk of opioid misuse, diversion, or overdose.  -Continue Belbuca, tramadol,  Lyrica, cyclobenzaprine (qhs)/methocarbamol (TID); will not add additional opiates at this time   Schizophrenia -Continue home mood meds - doxepin, Ativan, Effexor, Geodon    HTN -Hold amlodipine and Hyzaar for now due to low-normal BP   HLD -Continue Crestor   Hypothyroidism -Continue Synthroid   OSA -Continue CPAP   DM -Recent A1c was 9.6, poor control -Continue NPH -hold Trulicity -Cover with resistant-scale SSI    Lung nodule -CT ordered for further evaluation -Pending results of CT   Stage 3b CKD -LE edema noted at presentation -Improving with trial of lasix -Recheck bmet in AM   Morbid obesity -Body mass index is 53.25 kg/m..  -Weight loss should be encouraged -Outpatient PCP/bariatric medicine f/u encouraged  -This is very likely to contribute to presenting condition and increases her overall morbidity/mortality risk      Subjective: Reports some improvement in LE edema and breathing  Physical Exam: Vitals:   09/01/21 2200 09/02/21 0046 09/02/21 0603 09/02/21 0818  BP:  127/82 (!) 110/57 121/74  Pulse: 82 86 92 94  Resp:  _0 Temp:   98.1 F (36.7 C) 98.2 F (36.8 C)  TempSrc:    Oral  SpO2:  96% (!) 88% 100%  Weight:      Height:       General exam: Conversant, in no acute distress Respiratory system: normal chest rise, clear, no audible wheezing Cardiovascular system: regular rhythm, s1-s2 Gastrointestinal system: Nondistended, nontender, pos BS Central nervous system: No seizures, no tremors Extremities: No cyanosis, no joint deformities Skin: No  rashes, no pallor Psychiatry: Affect normal // no auditory hallucinations   Data Reviewed:  Labs reviewed: Cr 1.25, Hgb 9.2, K 3.8    Family Communication: Pt in room, family not at bedside  Disposition: Status is: Observation The patient will require care spanning > 2 midnights and should be moved to inpatient because: Severity of illness  Planned Discharge Destination: Skilled  nursing facility    Author: Marylu Lund, MD 09/02/2021 3:28 PM  For on call review [email or web address].

## 2021-09-02 NOTE — Progress Notes (Signed)
Initial Nutrition Assessment  DOCUMENTATION CODES:   Morbid obesity  INTERVENTION:   -Double protein portions with meals -MVI with minerals daily -Changed diet order to "with assist"  NUTRITION DIAGNOSIS:   Increased nutrient needs related to acute illness as evidenced by estimated needs.  GOAL:   Patient will meet greater than or equal to 90% of their needs  MONITOR:   PO intake  REASON FOR ASSESSMENT:   Consult Assessment of nutrition requirement/status  ASSESSMENT:   Pt with medical history significant of remote breast CA; morbid obesity;  chronic back pain; stage 3 CKD; HTN; HLD; hypothyroidism; OSA on CPAP; DM; and schizophrenia presenting with falls.  Pt admitted with ambulatory dysfunction.    Reviewed I/O's: -1.1 L x 24 hours and -2 L since admission  UOP: 1.6 L x 24 hours  Per H&P, pt with joint dysfunction of rt knee and ligamentous injury to rt foot, which are creating gait instability. Pt has had recurrent falls PTA.   Spoke with pt over the phone, who reports feeling better today. She reports that she has been eating all of the meals that have been given to her, however, reports that her trays have been delayed (noted pt has been ordered automatic house trays). RD placed dinner meal order per pt request (pizza, side salad, chocolate ice cream, and diet gingerale).   PTA pt reports she usually eats 2 times per day (10 AM and 4 PM). She has a caregiver that prepares meals, but unable to provide a diet recall.   Per pt, she is unsure of UBW, but states that she has been gaining weight. Reviewed wt hx; pt has experienced a 14% wt loss over the past 11 months. Noted pt with deep pitting edema, which may be masking true wt loss as well as fat and muscle depletions.   Discussed importance of good meal intake to promote healing.   Obesity is a complex, chronic medical condition that is optimally managed by a multidisciplinary care team. Weight loss is not an  ideal goal for an acute inpatient hospitalization. However, if further work-up for obesity is warranted, consider outpatient referral to outpatient bariatric service and/or 's Nutrition and Diabetes Education Services.    Medications reviewed and include colace, lasix, and ativan.   Lab Results  Component Value Date   HGBA1C 9.6 (A) 04/26/2021   PTA DM medications are 55 units insulin NPH daily.   Labs reviewed: CBGS: 106-132 (inpatient orders for glycemic control are 0-20 units insulin aspart TID with meals, 0-5 units insulin aspart daily at bedtime, and 55 units insulin NPO daily).    Diet Order:   Diet Order             Diet Carb Modified Fluid consistency: Thin; Room service appropriate? Yes  Diet effective now                   EDUCATION NEEDS:   Education needs have been addressed  Skin:  Skin Assessment: Reviewed RN Assessment  Last BM:  08/28/21  Height:   Ht Readings from Last 1 Encounters:  08/31/21 _0  (1.651 m)    Weight:   Wt Readings from Last 1 Encounters:  08/31/21 (!) 145.2 kg    Ideal Body Weight:  56.8 kg  BMI:  Body mass index is 53.25 kg/m.  Estimated Nutritional Needs:   Kcal:  1800-2000  Protein:  115-130 grams  Fluid:  > 1.8 L    Loistine Chance, RD, LDN,  CDCES Registered Dietitian II Certified Diabetes Care and Education Specialist Please refer to [facility] for RD and/or RD on-call/weekend/after hours pager

## 2021-09-03 DIAGNOSIS — N179 Acute kidney failure, unspecified: Secondary | ICD-10-CM

## 2021-09-03 DIAGNOSIS — R262 Difficulty in walking, not elsewhere classified: Secondary | ICD-10-CM | POA: Diagnosis not present

## 2021-09-03 LAB — COMPREHENSIVE METABOLIC PANEL
ALT: 25 U/L (ref 0–44)
AST: 17 U/L (ref 15–41)
Albumin: 2.6 g/dL — ABNORMAL LOW (ref 3.5–5.0)
Alkaline Phosphatase: 79 U/L (ref 38–126)
Anion gap: 7 (ref 5–15)
BUN: 19 mg/dL (ref 8–23)
CO2: 30 mmol/L (ref 22–32)
Calcium: 8.9 mg/dL (ref 8.9–10.3)
Chloride: 103 mmol/L (ref 98–111)
Creatinine, Ser: 1.29 mg/dL — ABNORMAL HIGH (ref 0.44–1.00)
GFR, Estimated: 46 mL/min — ABNORMAL LOW (ref >60)
Glucose, Bld: 126 mg/dL — ABNORMAL HIGH (ref 70–99)
Potassium: 3.6 mmol/L (ref 3.5–5.1)
Sodium: 140 mmol/L (ref 135–145)
Total Bilirubin: 0.6 mg/dL (ref 0.3–1.2)
Total Protein: 5.3 g/dL — ABNORMAL LOW (ref 6.5–8.1)

## 2021-09-03 LAB — CBC
HCT: 26 % — ABNORMAL LOW (ref 36.0–46.0)
Hemoglobin: 8.3 g/dL — ABNORMAL LOW (ref 12.0–15.0)
MCH: 28.3 pg (ref 26.0–34.0)
MCHC: 31.9 g/dL (ref 30.0–36.0)
MCV: 88.7 fL (ref 80.0–100.0)
Platelets: 196 10*3/uL (ref 150–400)
RBC: 2.93 MIL/uL — ABNORMAL LOW (ref 3.87–5.11)
RDW: 16.1 % — ABNORMAL HIGH (ref 11.5–15.5)
WBC: 7.3 10*3/uL (ref 4.0–10.5)
nRBC: 0 % (ref 0.0–0.2)

## 2021-09-03 LAB — GLUCOSE, CAPILLARY
Glucose-Capillary: 155 mg/dL — ABNORMAL HIGH (ref 70–99)
Glucose-Capillary: 168 mg/dL — ABNORMAL HIGH (ref 70–99)
Glucose-Capillary: 208 mg/dL — ABNORMAL HIGH (ref 70–99)
Glucose-Capillary: 162 mg/dL — ABNORMAL HIGH (ref 70–99)

## 2021-09-03 NOTE — Progress Notes (Signed)
Mobility Specialist Progress Note:   09/03/21 1605  Mobility  Activity Transferred from chair to bed  Level of Assistance +2 (takes two people)  Assistive Device MaxiMove  Activity Response Tolerated fair  $Mobility charge 1 Mobility   Pt requesting to get back to bed d/t pain in buttocks. Tolerated transfer fair d/t pain. Pt left in bed with all needs met, bed alarm on.  [name] Acute Rehab Secure Chat or Office Phone: [phone]

## 2021-09-03 NOTE — Progress Notes (Signed)
Occupational Therapy Treatment Patient Details Name: [name] MRN: [id number] DOB: 03/07/1955 Today's Date: 09/03/2021   History of present illness 66 y/o female admitted secondary to multiple falls. Workup pending. PMH includes Breast cancer, HSV  infection, Memory difficulty, OSA on CPAP,  Schizophrenia, Secondary parkinsonism, and Type II diabetes mellitus.   OT comments  Pt progressed to the chair via sliding board transfer +2 (A) with pad used. Pt will require hoyer lift back to bed for safety. Pt continues to need SNF level care for d/c planning.    Recommendations for follow up therapy are one component of a multi-disciplinary discharge planning process, led by the attending physician.  Recommendations may be updated based on patient status, additional functional criteria and insurance authorization.    Follow Up Recommendations  Skilled nursing-short term rehab (<3 hours/day)    Assistance Recommended at Discharge Intermittent Supervision/Assistance  Patient can return home with the following  Two people to help with walking and/or transfers;Two people to help with bathing/dressing/bathroom   Equipment Recommendations  BSC/3in1    Recommendations for Other Services      Precautions / Restrictions Precautions Precautions: Fall       Mobility Bed Mobility               General bed mobility comments: up on side of the PT on arrival    Transfers Overall transfer level: Needs assistance Equipment used: Sliding board Transfers: Sit to/from Stand, Bed to chair/wheelchair/BSC Sit to Stand: +2 physical assistance, Max assist          Lateral/Scoot Transfers: +2 physical assistance, Mod assist, With slide board, From elevated surface General transfer comment: attempting to clear bed surface pad and belt x2 therapist with recliner placed in front of patient pulling up. pt anterior shifted but was unable to stand on feet     Balance Overall balance  assessment: Mild deficits observed, not formally tested                                         ADL either performed or assessed with clinical judgement   ADL Overall ADL's : Needs assistance/impaired Eating/Feeding: Set up;Bed level   Grooming: Set up;Sitting Grooming Details (indicate cue type and reason): sitting eob                 Toilet Transfer: Total assistance             General ADL Comments: pt was able to progress from EOB to chair level this session. Hoyer pad ordered and Chirop[employer]ist of need for [employer] back to bed    [employer]      Cognition Arousal/Alertness: Awake/alert Behavior During Therapy: WFL for tasks assessed/performed Overall Cognitive Status: No family/caregiver present to determine baseline cognitive functioning                                 General Comments: pt lacks awareness to oxygen drops,        Exercises      Shoulder Instructions       General Comments RA 79% wash face and instructed to keep oxygen on. pt required 3L O2 static sitting eob  to sustain 90%    Pertinent Vitals/ Pain       Pain Assessment Pain Assessment: 0-10 Pain Score: 8  Pain Location: lower back and buttock Pain Descriptors / Indicators: Dull, Aching Pain Intervention(s): Monitored during session, Repositioned  Home Living                                          Prior Functioning/Environment              Frequency  Min 2X/week        Progress Toward Goals  OT Goals(current goals can now be found in the care plan section)  Progress towards OT goals: Progressing toward goals  Acute Rehab OT Goals Patient Stated Goal: to go home to dog dusty OT Goal Formulation: With patient Time For Goal Achievement: 09/15/21 Potential to Achieve Goals: Good ADL Goals Pt Will Transfer to Toilet: with mod assist;squat pivot  transfer;bedside commode Additional ADL Goal #1: pt will complete bed mobility supervision as precursor to adls. Additional ADL Goal #2: pt will complete sit<>Stand for 1 minute as precursor to stand pivot to w/c  Plan Discharge plan remains appropriate    Co-evaluation    PT/OT/SLP Co-Evaluation/Treatment: Yes Reason for Co-Treatment: For patient/therapist safety;To address functional/ADL transfers;Necessary to address cognition/behavior during functional activity   OT goals addressed during session: ADL's and self-care;Proper use of Adaptive equipment and DME      AM-PAC OT "6 Clicks" Daily Activity     Outcome Measure   Help from another person eating meals?: A Little Help from another person taking care of personal grooming?: A Little Help from another person toileting, which includes using toliet, bedpan, or urinal?: A Lot Help from another person bathing (including washing, rinsing, drying)?: A Lot Help from another person to put on and taking off regular upper body clothing?: A Lot Help from another person to put on and taking off regular lower body clothing?: A Lot 6 Click Score: 14    End of Session Equipment Utilized During Treatment: Oxygen  OT Visit Diagnosis: Unsteadiness on feet (R26.81);Muscle weakness (generalized) (M62.81);Pain   Activity Tolerance Patient tolerated treatment well   Patient Left in chair;with call bell/phone within reach;with chair alarm set   Nurse Communication Mobility status;Precautions;Need for lift equipment        Time: 2563-8937 OT Time Calculation (min): 43 min  Charges: OT General Charges $OT Visit: 1 Visit OT Treatments $Self Care/Home Management : 8-22 mins $Therapeutic Activity: 8-22 mins   Brynn, OTR/L  Acute Rehabilitation Services Office: [phone] .   Jeri Modena 09/03/2021, 12:12 PM

## 2021-09-03 NOTE — Progress Notes (Signed)
Physical Therapy Treatment Patient Details Name: [name] MRN: [id number] DOB: Aug 03, 1955 Today's Date: 09/03/2021   History of Present Illness 66 y/o female admitted secondary to multiple falls. Workup pending. PMH includes Breast cancer, HSV  infection, Memory difficulty, OSA on CPAP,  Schizophrenia, Secondary parkinsonism, and Type II diabetes mellitus.    PT Comments    Continuing work on functional mobility and activity tolerance;  Session focused on functional transfers; Started session with working on sit to stand/pivot transfers; difficulty clearing hips from EOB with 2 person max assist; Shifted to work on lateral scoot tsfr OOB to drop arm recliner on pt's R with sliding board; 2 person assist, fatigued post transfer to recliner, but able to reciprocally scoot hips backwards in chair (with demo cues) for optimal positioning; continue to recommend SNF for post-acute rehab  Recommendations for follow up therapy are one component of a multi-disciplinary discharge planning process, led by the attending physician.  Recommendations may be updated based on patient status, additional functional criteria and insurance authorization.  Follow Up Recommendations  Skilled nursing-short term rehab (<3 hours/day) Can patient physically be transported by private vehicle: No   Assistance Recommended at Discharge Frequent or constant Supervision/Assistance  Patient can return home with the following Two people to help with walking and/or transfers;Two people to help with bathing/dressing/bathroom;Assistance with cooking/housework;Assist for transportation;Help with stairs or ramp for entrance   Equipment Recommendations  Other (comment) (Consider sliding board)    Recommendations for Other Services       Precautions / Restrictions Precautions Precautions: Fall     Mobility  Bed Mobility Overal bed mobility: Needs Assistance Bed Mobility: Supine to Sit     Supine to sit: Mod  assist     General bed mobility comments: Incr time and cues for technique; TEnds to pull self to long sit, then uses bedrails to turn to EOB; Mod assist and use of bed pad to help L hip forward to EOB    Transfers Overall transfer level: Needs assistance Equipment used: Sliding board Transfers: Sit to/from Stand, Bed to chair/wheelchair/BSC Sit to Stand: +2 physical assistance, Max assist          Lateral/Scoot Transfers: +2 physical assistance, Mod assist, With slide board, From elevated surface General transfer comment: attempting to clear bed surface pad and belt x2 therapist with recliner placed in front of patient pulling up. pt anterior shifted but was unable to stand on feet; Performed lateral scoot transfer towards pt's R side with sliding board (verbal and demo cues for sliding board use); able to perform small scoots without assist, but they are short, inefficient, and ultimately energetically taxing; Mod assist of 2 to help with smoother scoots    Ambulation/Gait                   Stairs             Wheelchair Mobility    Modified Rankin (Stroke Patients Only)       Balance Overall balance assessment: Mild deficits observed, not formally tested                                          Cognition Arousal/Alertness: Awake/alert Behavior During Therapy: WFL for tasks assessed/performed Overall Cognitive Status: No family/caregiver present to determine baseline cognitive functioning  General Comments: pt lacks awareness to oxygen drops,        Exercises      General Comments        Pertinent Vitals/Pain Pain Assessment Pain Assessment: 0-10 Pain Score: 8  Pain Location: lower back and buttock Pain Descriptors / Indicators: Dull, Aching Pain Intervention(s): Monitored during session    Home Living                          Prior Function            PT Goals  (current goals can now be found in the care plan section) Acute Rehab PT Goals Patient Stated Goal: to decrease pain PT Goal Formulation: With patient Time For Goal Achievement: 09/14/21 Potential to Achieve Goals: Good Progress towards PT goals: Progressing toward goals (slowly)    Frequency    Min 2X/week      PT Plan Current plan remains appropriate    Co-evaluation PT/OT/SLP Co-Evaluation/Treatment: Yes Reason for Co-Treatment: For patient/therapist safety;To address functional/ADL transfers;Necessary to address cognition/behavior during functional activity PT goals addressed during session: Mobility/safety with mobility        AM-PAC PT "6 Clicks" Mobility   Outcome Measure  Help needed turning from your back to your side while in a flat bed without using bedrails?: A Lot Help needed moving from lying on your back to sitting on the side of a flat bed without using bedrails?: A Lot Help needed moving to and from a bed to a chair (including a wheelchair)?: Total Help needed standing up from a chair using your arms (e.g., wheelchair or bedside chair)?: Total Help needed to walk in hospital room?: Total Help needed climbing 3-5 steps with a railing? : Total 6 Click Score: 8    End of Session Equipment Utilized During Treatment: Gait belt (sliding board) Activity Tolerance: Patient limited by pain Patient left: in chair;with call bell/phone within reach Nurse Communication: Mobility status;Need for lift equipment PT Visit Diagnosis: Other abnormalities of gait and mobility (R26.89);History of falling (Z91.81);Muscle weakness (generalized) (M62.81);Difficulty in walking, not elsewhere classified (R26.2);Pain Pain - Right/Left: Right Pain - part of body: Knee     Time: 1022-1122 PT Time Calculation (min) (ACUTE ONLY): 60 min  Charges:  $Therapeutic Activity: 23-37 mins                     Roney Marion, PT  Acute Rehabilitation Services Office  [phone]    Colletta [place] 09/03/2021, 4:23 PM

## 2021-09-03 NOTE — Progress Notes (Signed)
Progress Note   Patient: [name] DGR:[id number] DOB: 29-Jan-1956 DOA: 08/31/2021     1 DOS: the patient was seen and examined on 09/03/2021   Brief hospital course: 66 y.o. female with medical history significant of remote breast CA; morbid obesity;  chronic back pain; stage 3 CKD; HTN; HLD; hypothyroidism; OSA on CPAP; DM; and schizophrenia presenting with falls.  She reports that she has a right prior knee surgery that has failed and cannot be surgically repaired as well as a right foot ligamentous injury that cannot be repaired.  Her right leg has been giving out when she tries to walk from bathroom to chair - she is consistently one step short before her leg gives out.  EMS has been out to help her up multiple times in the last few weeks and brought her to the ER  On further questioning, pt reports her primary complaint had been increasing LE swelling and weakness. Pt at baseline mobilizes via electric wheelchair and ambulates only to get to the bathroom.  Assessment and Plan: Ambulatory dysfunction -Patient with reported joint dysfunction of R knee and ligamentous injury of R foot - both creating gait instability -PT/OT consulted with recs for SNF   Hypoxia likely secondary to pulm HTN -Presented hypoxemic with BLE edema -Pt is O2 naive, weaned to [facility]. Cont to wean O2 as tolerated -Did not tolerate hospital provided cpap -Initial chest imaging with concerns of possible bronchitis, however no significant symptoms -F/u CT chest with findings of cardiomegaly, gross enlargement of main pulm arter consistent with pulm HTN -Mild to mod AS on 2d echo with normal LVEF -Improving with IV lasix, cont to diurese as tolerated   Chronic pain -Seems stable this AM -Continue [place], tramadol, Lyrica, cyclobenzaprine (qhs)/methocarbamol (TID); will not add additional opiates at this time   Schizophrenia -Continue home mood meds - doxepin, Ativan, Effexor, Geodon    HTN -Hold amlodipine  and Hyzaar for now  -BP stable and controlled   HLD -Continue Crestor   Hypothyroidism -Continue Synthroid   OSA -Continue CPAP   DM -Recent A1c was 9.6, poor control -Continue NPH -hold Trulicity while in hospital -Cover with resistant-scale SSI    Lung nodule ruled out -Initially seen on CXR -Reviewed f/u CT chest. Bandlike scarring or atelectasis noted with no evidence of mass   Stage 3b CKD -LE edema noted at presentation -Tolerating trial of lasix -Recheck bmet in AM   Morbid obesity -Body mass index is 53.25 kg/m..  -Weight loss should be encouraged -Outpatient PCP/bariatric medicine f/u encouraged      Subjective: Reports some improvement in LE edema and breathing  Physical Exam: Vitals:   09/02/21 2213 09/03/21 0512 09/03/21 0809 09/03/21 1602  BP: (!) 101/50 134/70 (!) 108/56 (!) 111/59  Pulse: 100 97 97 99  Resp: _0 Temp: (!) 100.6 F (38.1 C) 98.4 F (36.9 C) 99.8 F (37.7 C) 98.2 F (36.8 C)  TempSrc: Oral Oral Oral Oral  SpO2: 95% 94% 94% 95%  Weight:      Height:       General exam: Awake, laying in bed, in nad Respiratory system: Normal respiratory effort, no wheezing Cardiovascular system: regular rate, s1, s2 Gastrointestinal system: Soft, nondistended, positive BS Central nervous system: CN2-12 grossly intact, strength intact Extremities: Perfused, no clubbing Skin: Normal skin turgor, no notable skin lesions seen Psychiatry: Mood normal // no visual hallucinations   Data Reviewed:  Labs reviewed: Cr 1.29, Hgb 8.3, K 3.6  Family Communication: Pt in room, family not at bedside  Disposition: Status is: Inpatient Continue inpatient stay because: Severity of illness  Planned Discharge Destination: Skilled nursing facility    Author: Marylu Lund, MD 09/03/2021 5:51 PM  For on call review [email or web address].

## 2021-09-04 DIAGNOSIS — Z9989 Dependence on other enabling machines and devices: Secondary | ICD-10-CM

## 2021-09-04 DIAGNOSIS — I10 Essential (primary) hypertension: Secondary | ICD-10-CM | POA: Diagnosis not present

## 2021-09-04 DIAGNOSIS — R296 Repeated falls: Secondary | ICD-10-CM | POA: Diagnosis not present

## 2021-09-04 DIAGNOSIS — R262 Difficulty in walking, not elsewhere classified: Secondary | ICD-10-CM | POA: Diagnosis not present

## 2021-09-04 DIAGNOSIS — G894 Chronic pain syndrome: Secondary | ICD-10-CM | POA: Diagnosis not present

## 2021-09-04 DIAGNOSIS — E890 Postprocedural hypothyroidism: Secondary | ICD-10-CM

## 2021-09-04 DIAGNOSIS — F209 Schizophrenia, unspecified: Secondary | ICD-10-CM

## 2021-09-04 DIAGNOSIS — G4733 Obstructive sleep apnea (adult) (pediatric): Secondary | ICD-10-CM

## 2021-09-04 LAB — GLUCOSE, CAPILLARY
Glucose-Capillary: 152 mg/dL — ABNORMAL HIGH (ref 70–99)
Glucose-Capillary: 126 mg/dL — ABNORMAL HIGH (ref 70–99)
Glucose-Capillary: 137 mg/dL — ABNORMAL HIGH (ref 70–99)
Glucose-Capillary: 152 mg/dL — ABNORMAL HIGH (ref 70–99)

## 2021-09-04 LAB — CBC
HCT: 26.1 % — ABNORMAL LOW (ref 36.0–46.0)
Hemoglobin: 8.2 g/dL — ABNORMAL LOW (ref 12.0–15.0)
MCH: 28.2 pg (ref 26.0–34.0)
MCHC: 31.4 g/dL (ref 30.0–36.0)
MCV: 89.7 fL (ref 80.0–100.0)
Platelets: 191 10*3/uL (ref 150–400)
RBC: 2.91 MIL/uL — ABNORMAL LOW (ref 3.87–5.11)
RDW: 16.1 % — ABNORMAL HIGH (ref 11.5–15.5)
WBC: 6.3 10*3/uL (ref 4.0–10.5)
nRBC: 0 % (ref 0.0–0.2)

## 2021-09-04 LAB — COMPREHENSIVE METABOLIC PANEL
ALT: 30 U/L (ref 0–44)
AST: 25 U/L (ref 15–41)
Albumin: 2.7 g/dL — ABNORMAL LOW (ref 3.5–5.0)
Alkaline Phosphatase: 103 U/L (ref 38–126)
Anion gap: 8 (ref 5–15)
BUN: 22 mg/dL (ref 8–23)
CO2: 31 mmol/L (ref 22–32)
Calcium: 8.7 mg/dL — ABNORMAL LOW (ref 8.9–10.3)
Chloride: 102 mmol/L (ref 98–111)
Creatinine, Ser: 1.3 mg/dL — ABNORMAL HIGH (ref 0.44–1.00)
GFR, Estimated: 46 mL/min — ABNORMAL LOW (ref >60)
Glucose, Bld: 136 mg/dL — ABNORMAL HIGH (ref 70–99)
Potassium: 3.5 mmol/L (ref 3.5–5.1)
Sodium: 141 mmol/L (ref 135–145)
Total Bilirubin: 0.7 mg/dL (ref 0.3–1.2)
Total Protein: 5.5 g/dL — ABNORMAL LOW (ref 6.5–8.1)

## 2021-09-04 MED ORDER — FUROSEMIDE 10 MG/ML IJ SOLN
40.0000 mg | Freq: Every day | INTRAMUSCULAR | Status: DC
Start: 2021-09-05 — End: 2021-09-09
  Administered 2021-09-05 – 2021-09-09 (×5): 40 mg via INTRAVENOUS
  Filled 2021-09-04 (×5): qty 4

## 2021-09-04 NOTE — TOC Progression Note (Addendum)
Transition of Care [facility]) - Progression Note    Patient Details  Name: [name] MRN: [id number] Date of Birth: 04-06-1955  [facility] of [facility]) CM/SW Contact  Carles Collet, RN Phone Number: 09/04/2021, 1:20 PM  Clinical Narrative:    Patient admitted with weakness and falls. Patient relies on EMS when she falls at home, calling then 4 times on the day she presented to ED. She appears to be unsafe living independently Per notes, she has PCS services 3 hours a day, everyday, but is at home alone 21 hours a day.  She cannot mobilize to the the bathroom, relies on WC in the house, and has been falling when transitioning from bed to [facility].  PT eval from yesterday shows that she is a 2 person max assist to stand up. SNF recommended and encouraged. Patient is resistant to SNF, but I spoke w her at bedside and explained concerns of her going home. She states that she is concerned about her who would take care of her dog if she went to SNF, states that her daughter is currently but may not be able to long term. She will discuss this with her daughter the next time they speak. She is agreeable to be faxed out to see SNF options, will update CSW.      Expected Discharge Plan: [place] Barriers to Discharge: Continued Medical Work up  Expected Discharge Plan and Services Expected Discharge Plan: [place]   Discharge Planning Services: CM Consult Post Acute Care Choice: Durable Medical Equipment, Home Health Living arrangements for the past 2 months: Single Family Home                           HH Arranged: PT, OT, Refused SNF           Social Determinants of Health (SDOH) Interventions    Readmission Risk Interventions     No data to display

## 2021-09-04 NOTE — Progress Notes (Signed)
Progress Note   Patient: [name] GWO:[id number] DOB: September 25, 1955 DOA: 08/31/2021     2 DOS: the patient was seen and examined on 09/04/2021   Brief hospital course: 66 y.o. female with medical history significant of remote breast CA; morbid obesity;  chronic back pain; stage 3 CKD; HTN; HLD; hypothyroidism; OSA on CPAP; DM; and schizophrenia presenting with falls.  She reports that she has a right prior knee surgery that has failed and cannot be surgically repaired as well as a right foot ligamentous injury that cannot be repaired.  Her right leg has been giving out when she tries to walk from bathroom to chair - she is consistently one step short before her leg gives out.  EMS has been out to help her up multiple times in the last few weeks and brought her to the ER. On further questioning, pt reports her primary complaint had been increasing LE swelling and weakness. Pt at baseline mobilizes via electric wheelchair and ambulates only to get to the bathroom.  Assessment and Plan: Ambulatory dysfunction Patient with reported joint dysfunction of R knee and ligamentous injury of R foot - both creating gait instability PT/OT consulted with recs for SNF   Hypoxia likely secondary to pulm HTN/obesity hypoventilation Pt is O2 naive, wean O2 as tolerated Did not tolerate hospital provided cpap Initial chest imaging with concerns of possible bronchitis, however no significant symptoms F/u CT chest with findings of cardiomegaly, gross enlargement of main pulm arter consistent with pulm HTN Mild to mod AS on 2d echo with normal LVEF Improving with IV lasix, cont to diurese as tolerated   Chronic pain Continue Belbuca, tramadol, Lyrica, cyclobenzaprine (qhs)/methocarbamol (TID); will not add additional opiates at this time   Schizophrenia Continue home mood meds - doxepin, Ativan, Effexor, Geodon    HTN Hold amlodipine and Hyzaar for now  BP stable and controlled   HLD Continue Crestor    Hypothyroidism Continue Synthroid   OSA Continue CPAP, unable to tolerate   DM Recent A1c was 9.6, poor control Continue NPH, SSI, hypoglycemic protocol hold Trulicity while in hospital   Lung nodule ruled out Initially seen on CXR Reviewed f/u CT chest. Bandlike scarring or atelectasis noted with no evidence of mass   Stage 3b CKD LE edema noted at presentation Tolerating trial of lasix Monitor BMP closely  Anemia of chronic kidney disease Hemoglobin around baseline, but drop from about 5 months ago Daily CBC, monitor closely  Morbid obesity Body mass index is 53.25 kg/m.[name]  Lifestyle modification advised Outpatient PCP/bariatric medicine f/u encouraged      Subjective: Denies any new complaints, unsure if she wants to go to SNF.  No one to care for her dog at home.   Physical Exam: Vitals:   09/03/21 2014 09/04/21 0526 09/04/21 0721 09/04/21 1652  BP: (!) 103/59 99/69 122/61 102/63  Pulse: 94 93 99 93  Resp: _0 Temp: 98.3 F (36.8 C) 98 F (36.7 C) 99.3 F (37.4 C) 98.5 F (36.9 C)  TempSrc: Oral Oral Oral Oral  SpO2: 93% 94% 93% 95%  Weight:      Height:       General: NAD, obese Cardiovascular: S1, S2 present Respiratory: CTAB Abdomen: Soft, nontender, nondistended, bowel sounds present Musculoskeletal: Noted bilateral pedal edema noted, chronic venous stasis changes on BLE Skin: As mentioned above Psychiatry: Fair mood      Data Reviewed:  Labs reviewed: Cr 1.29, Hgb 8.3, K 3.6  Family Communication: None at bedside  Disposition: Status is: Inpatient Continue inpatient stay because: Severity of illness  Planned Discharge Destination: Skilled nursing facility    Author: Alma Friendly, MD 09/04/2021 7:27 PM  For on call review [email or web address].

## 2021-09-05 DIAGNOSIS — G894 Chronic pain syndrome: Secondary | ICD-10-CM | POA: Diagnosis not present

## 2021-09-05 DIAGNOSIS — R296 Repeated falls: Secondary | ICD-10-CM | POA: Diagnosis not present

## 2021-09-05 DIAGNOSIS — R262 Difficulty in walking, not elsewhere classified: Secondary | ICD-10-CM | POA: Diagnosis not present

## 2021-09-05 DIAGNOSIS — I10 Essential (primary) hypertension: Secondary | ICD-10-CM | POA: Diagnosis not present

## 2021-09-05 LAB — CBC WITH DIFFERENTIAL/PLATELET
Abs Immature Granulocytes: 0.03 10*3/uL (ref 0.00–0.07)
Basophils Absolute: 0 10*3/uL (ref 0.0–0.1)
Basophils Relative: 1 %
Eosinophils Absolute: 0.4 10*3/uL (ref 0.0–0.5)
Eosinophils Relative: 6 %
HCT: 29 % — ABNORMAL LOW (ref 36.0–46.0)
Hemoglobin: 9 g/dL — ABNORMAL LOW (ref 12.0–15.0)
Immature Granulocytes: 0 %
Lymphocytes Relative: 19 %
Lymphs Abs: 1.3 10*3/uL (ref 0.7–4.0)
MCH: 28 pg (ref 26.0–34.0)
MCHC: 31 g/dL (ref 30.0–36.0)
MCV: 90.1 fL (ref 80.0–100.0)
Monocytes Absolute: 0.6 10*3/uL (ref 0.1–1.0)
Monocytes Relative: 9 %
Neutro Abs: 4.5 10*3/uL (ref 1.7–7.7)
Neutrophils Relative %: 65 %
Platelets: 221 10*3/uL (ref 150–400)
RBC: 3.22 MIL/uL — ABNORMAL LOW (ref 3.87–5.11)
RDW: 15.9 % — ABNORMAL HIGH (ref 11.5–15.5)
WBC: 7 10*3/uL (ref 4.0–10.5)
nRBC: 0 % (ref 0.0–0.2)

## 2021-09-05 LAB — BASIC METABOLIC PANEL
Anion gap: 12 (ref 5–15)
BUN: 28 mg/dL — ABNORMAL HIGH (ref 8–23)
CO2: 30 mmol/L (ref 22–32)
Calcium: 9.3 mg/dL (ref 8.9–10.3)
Chloride: 101 mmol/L (ref 98–111)
Creatinine, Ser: 1.2 mg/dL — ABNORMAL HIGH (ref 0.44–1.00)
GFR, Estimated: 50 mL/min — ABNORMAL LOW (ref >60)
Glucose, Bld: 124 mg/dL — ABNORMAL HIGH (ref 70–99)
Potassium: 3.7 mmol/L (ref 3.5–5.1)
Sodium: 143 mmol/L (ref 135–145)

## 2021-09-05 LAB — GLUCOSE, CAPILLARY
Glucose-Capillary: 107 mg/dL — ABNORMAL HIGH (ref 70–99)
Glucose-Capillary: 113 mg/dL — ABNORMAL HIGH (ref 70–99)
Glucose-Capillary: 118 mg/dL — ABNORMAL HIGH (ref 70–99)
Glucose-Capillary: 167 mg/dL — ABNORMAL HIGH (ref 70–99)

## 2021-09-05 NOTE — Progress Notes (Signed)
Mobility Specialist Progress Note:   09/05/21 1100  Mobility  Activity Transferred from bed to chair  Level of Assistance +2 (takes two people)  Assistive Device MaxiMove  Activity Response Tolerated well  $Mobility charge 1 Mobility   Pt in bed and agreeable. No complaints. Pt left in chair with all needs met.   [name] Mobility Specialist-Acute Rehab Secure Chat only

## 2021-09-05 NOTE — Progress Notes (Signed)
Mobility Specialist Progress Note:   09/05/21 1244  Mobility  Activity Transferred from chair to bed  Level of Assistance +2 (takes two people)  Assistive Device MaxiMove  Activity Response Tolerated well  $Mobility charge 1 Mobility   Pt requesting transfer to bed. No complaints. Pt left in bed with all needs met.   Bond Grieshop Mobility Specialist-Acute Rehab Secure Chat only

## 2021-09-05 NOTE — Progress Notes (Signed)
Progress Note   Patient: [name] ZOX:[id number] DOB: Apr 21, 1955 DOA: 08/31/2021     3 DOS: the patient was seen and examined on 09/05/2021   Brief hospital course: 66 y.o. female with medical history significant of remote breast CA; morbid obesity;  chronic back pain; stage 3 CKD; HTN; HLD; hypothyroidism; OSA on CPAP; DM; and schizophrenia presenting with falls.  She reports that she has a right prior knee surgery that has failed and cannot be surgically repaired as well as a right foot ligamentous injury that cannot be repaired.  Her right leg has been giving out when she tries to walk from bathroom to chair - she is consistently one step short before her leg gives out.  EMS has been out to help her up multiple times in the last few weeks and brought her to the ER. On further questioning, pt reports her primary complaint had been increasing LE swelling and weakness. Pt at baseline mobilizes via electric wheelchair and ambulates only to get to the bathroom.   Assessment and Plan:  Ambulatory dysfunction Patient with reported joint dysfunction of R knee and ligamentous injury of R foot - both creating gait instability PT/OT consulted with recs for SNF   Hypoxia likely secondary to pulm HTN/obesity hypoventilation Pt is O2 naive, wean O2 as tolerated Did not tolerate hospital provided cpap Initial chest imaging with concerns of possible bronchitis, however no significant symptoms F/u CT chest with findings of cardiomegaly, gross enlargement of main pulm arter consistent with pulm HTN Mild to mod AS on 2d echo with normal LVEF Improving with IV lasix, cont to diurese as tolerated   Chronic pain Continue Belbuca, tramadol, Lyrica, cyclobenzaprine (qhs)/methocarbamol (TID); will not add additional opiates at this time   Schizophrenia Continue home mood meds - doxepin, Ativan, Effexor, Geodon    HTN Hold amlodipine and Hyzaar for now  BP stable and controlled   HLD Continue  Crestor   Hypothyroidism Continue Synthroid   OSA Continue CPAP, unable to tolerate   DM Recent A1c was 9.6, poor control Continue NPH, SSI, hypoglycemic protocol hold Trulicity while in hospital   Lung nodule ruled out Initially seen on CXR Reviewed f/u CT chest. Bandlike scarring or atelectasis noted with no evidence of mass   Stage 3b CKD LE edema noted at presentation Tolerating trial of lasix Monitor BMP closely  Anemia of chronic kidney disease Hemoglobin around baseline, but drop from about 5 months ago Daily CBC, monitor closely  Morbid obesity Body mass index is 53.25 kg/m.[name]  Lifestyle modification advised Outpatient PCP/bariatric medicine f/u encouraged      Subjective:  Patient denies any new complaints, except for some nasal dryness and mild nasal bleeding.  Discussed with RN for humidified oxygen.   Physical Exam: Vitals:   09/05/21 0403 09/05/21 0534 09/05/21 0829 09/05/21 1635  BP:  96/77 122/65 (!) 104/59  Pulse:  84 89 90  Resp:  _0 Temp:  99.4 F (37.4 C) 98.2 F (36.8 C) 98.3 F (36.8 C)  TempSrc:  Oral Oral Oral  SpO2:  94% 95% 97%  Weight: (!) 169.4 kg     Height:       General: NAD, obese Cardiovascular: S1, S2 present Respiratory: CTAB Abdomen: Soft, nontender, nondistended, bowel sounds present Musculoskeletal: Noted bilateral pedal edema noted, chronic venous stasis changes on BLE Skin: As mentioned above Psychiatry: Fair mood      Data Reviewed:  Labs reviewed: Cr 1.29, Hgb 8.3, K 3.6  Family Communication: None at bedside  Disposition: Status is: Inpatient Continue inpatient stay because: Severity of illness  Planned Discharge Destination: Skilled nursing facility    Author: Alma Friendly, MD 09/05/2021 5:47 PM  For on call review [email or web address].

## 2021-09-05 NOTE — NC FL2 (Signed)
[place] LEVEL OF CARE SCREENING TOOL     IDENTIFICATION  Patient Name: [name] Birthdate: April 28, 1955 Sex: female Admission Date (Current Location): 08/31/2021  [facility] Care Communities and [place] Number:  Herbalist and Address:  [facility], [place] [address], [place], [place] [address]      Provider Number: [id number]  Attending Physician Name and Address:  Alma Friendly, MD  Relative Name and Phone Number:  [name] [phone]    Current Level of Care: Hospital Recommended Level of Care: [place] Prior Approval Number:    Date Approved/Denied:   PASRR Number: [id number]  Discharge Plan: SNF    Current Diagnoses: Patient Active Problem List   Diagnosis Date Noted   CHF exacerbation ([place]) 09/02/2021   Generalized weakness 08/31/2021   Ambulatory dysfunction 08/31/2021   Hypoxia 08/31/2021   AMS (altered mental status) 09/29/2020   Hyperparathyroidism, secondary ([place]) 03/23/2018   Memory difficulty 12/22/2017   Spinal stenosis 06/09/2017   HNP (herniated nucleus pulposus), lumbar 12/05/2016   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   Diabetic neuropathy ([place]) 09/08/2016   Hypocalcemia 08/22/2016   Abnormal barium swallow 07/07/2016   Anxiety 07/07/2016   History of malignant neoplasm of breast 07/07/2016   Gait abnormality 05/29/2016   Encephalopathy acute 04/23/2016   Hyponatremia 04/23/2016   Schizophrenia, paranoid ([place])    Aspiration pneumonia of left lower lobe due to gastric secretions (HCC)    Projectile vomiting with nausea    Altered mental status    Schizophrenia ([place])    Disorganized schizophrenia ([place])    Chronic pain syndrome    Acute renal failure (HCC)    Controlled diabetes mellitus type 2 with complications ([place])    Acute encephalopathy    Oropharyngeal dysphagia    Aspiration pneumonia of right lower lobe due to vomit  ([place])    Acute renal failure (ARF) ([place]) 03/28/2016   Encephalopathy 03/27/2016   HCAP (healthcare-associated pneumonia) 02/29/2016   Pneumonia 02/14/2016   CAP (community acquired pneumonia) 02/13/2016   Edema 02/13/2016   AKI (acute kidney injury) ([place]) 02/13/2016   Elevated troponin 06/24/2015   Diarrhea 06/23/2015   Sepsis ([place]) 06/23/2015   Obesity, morbid, BMI 50 or higher ([place]) 03/26/2015   Post traumatic stress disorder (PTSD) 11/14/2014   Leukocytosis 11/12/2014   Slurred speech 11/12/2014   CKD (chronic kidney disease) stage 3, GFR 30-59 ml/min (HCC)    Diabetes mellitus, type II, insulin dependent ([place])    TIA (transient ischemic attack)    Hypotension 10/28/2014   Syncope 10/28/2014   Anemia 10/28/2014   Type II diabetes mellitus ([place]) 10/28/2014   OSA on CPAP 10/28/2014   Chronic lower back pain 10/28/2014   Polyneuropathy in diabetes(357.2) 10/28/2014   Diabetic polyneuropathy ([place])    Fall 08/14/2014   Recurrent falls 08/14/2014   Hot flashes 02/24/2014   Arthralgia 02/24/2014   Hair loss 02/24/2014   Chest pain 01/06/2014   Shock ([place]) 08/08/2013   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   Dysarthria 12/30/2012   Benign paroxysmal positional vertigo 12/30/2012   Breast cancer of upper-outer quadrant of right female breast ([place]) 11/18/2012   Hemorrhage of rectum and anus 09/28/2012   Hx of radiation therapy    Syncope and collapse 06/04/2011   HSV 10/05/2008   Mild persistent asthma 06/02/2008   Dyspnea 07/19/2007   HLD (hyperlipidemia) 06/02/2006   Sinus tachycardia 05/25/2006   Hypothyroidism 02/27/2006  Depression 02/27/2006   Essential hypertension 02/27/2006   GERD 02/27/2006   KNEE PAIN 02/27/2006    Orientation RESPIRATION BLADDER Height & Weight     Self, Time, Situation, Place  O2, Normal (CPAP at night for OSA) Continent Weight: (!) 169.4 kg Height:  _0  (165.1 cm)  BEHAVIORAL SYMPTOMS/MOOD NEUROLOGICAL BOWEL NUTRITION STATUS       Continent Diet (See discharge summary)  AMBULATORY STATUS COMMUNICATION OF NEEDS Skin   Extensive Assist (2 people to assist) Verbally                         Personal Care Assistance Level of Assistance  Total care       Total Care Assistance: Maximum assistance   Functional Limitations Info             SPECIAL CARE FACTORS FREQUENCY  PT (By licensed PT), OT (By licensed OT)     PT Frequency: 5 x a week OT Frequency: 5 times a week            Contractures Contractures Info: Not present    Additional Factors Info  Code Status Code Status Info: Full Code             Current Medications (09/05/2021):  This is the current hospital active medication list Current Facility-Administered Medications  Medication Dose Route Frequency Provider Last Rate Last Admin   acetaminophen (TYLENOL) tablet 650 mg  650 mg Oral Q6H PRN Karmen Bongo, MD       Or   acetaminophen (TYLENOL) suppository 650 mg  650 mg Rectal Q6H PRN Karmen Bongo, MD       albuterol (PROVENTIL) (2.5 MG/3ML) 0.083% nebulizer solution 2.5 mg  2.5 mg Nebulization Q4H PRN Karmen Bongo, MD       aspirin EC tablet 81 mg  81 mg Oral Daily Karmen Bongo, MD   81 mg at 09/05/21 0945   calcitRIOL (ROCALTROL) capsule 0.25 mcg  0.25 mcg Oral Daily Karmen Bongo, MD   0.25 mcg at 09/05/21 0944   cyclobenzaprine (FLEXERIL) tablet 10 mg  10 mg Oral Ivery Quale, MD   10 mg at 09/04/21 2138   docusate sodium (COLACE) capsule 100 mg  100 mg Oral BID Karmen Bongo, MD   100 mg at 09/03/21 2233   doxepin (SINEQUAN) capsule 100 mg  100 mg Oral Ivery Quale, MD   100 mg at 09/04/21 2139   enoxaparin (LOVENOX) injection 70 mg  70 mg Subcutaneous Q24H Karmen Bongo, MD   70 mg at 09/04/21 2136   famotidine (PEPCID) tablet 40 mg  40 mg Oral QPC supper Karmen Bongo, MD   40 mg at 09/04/21 1757   furosemide (LASIX) injection 40 mg  40 mg Intravenous Daily Alma Friendly, MD   40 mg at  09/05/21 0945   hydrALAZINE (APRESOLINE) injection 5 mg  5 mg Intravenous Q4H PRN Karmen Bongo, MD       insulin aspart (novoLOG) injection 0-20 Units  0-20 Units Subcutaneous TID WC Karmen Bongo, MD   3 Units at 09/04/21 1758   insulin aspart (novoLOG) injection 0-5 Units  0-5 Units Subcutaneous QHS Karmen Bongo, MD       insulin NPH Human (NOVOLIN N) injection 55 Units  55 Units Subcutaneous QAC breakfast Karmen Bongo, MD   55 Units at 09/05/21 0820   levothyroxine (SYNTHROID) tablet 200 mcg  200 mcg Oral Q0600 Karmen Bongo, MD   200 mcg at  09/05/21 0616   linaclotide (LINZESS) capsule 290 mcg  290 mcg Oral QAC breakfast Karmen Bongo, MD   290 mcg at 09/05/21 0820   LORazepam (ATIVAN) tablet 0.5 mg  0.5 mg Oral Q8H PRN Karmen Bongo, MD       LORazepam (ATIVAN) tablet 1 mg  1 mg Oral TID Karmen Bongo, MD   1 mg at 09/05/21 0945   lubiprostone (AMITIZA) capsule 24 mcg  24 mcg Oral BID Karmen Bongo, MD   24 mcg at 09/05/21 0945   methocarbamol (ROBAXIN) tablet 750 mg  750 mg Oral TID Karmen Bongo, MD   750 mg at 09/05/21 0945   mirabegron ER (MYRBETRIQ) tablet 25 mg  25 mg Oral q AM Karmen Bongo, MD   25 mg at 09/05/21 0616   morphine (PF) 2 MG/ML injection 1 mg  1 mg Intravenous Q2H PRN Karmen Bongo, MD       multivitamin with minerals tablet 1 tablet  1 tablet Oral Daily Donne Hazel, MD   1 tablet at 09/05/21 0945   naloxone (NARCAN) nasal spray 4 mg/0.1 mL  1 spray Nasal PRN Karmen Bongo, MD       ondansetron [facility]) tablet 4 mg  4 mg Oral Q6H PRN Karmen Bongo, MD       Or   ondansetron [facility]) injection 4 mg  4 mg Intravenous Q6H PRN Karmen Bongo, MD       oxyCODONE (Oxy IR/ROXICODONE) immediate release tablet 5 mg  5 mg Oral Q4H Karmen Bongo, MD   5 mg at 09/05/21 0820   pantoprazole (PROTONIX) EC tablet 40 mg  40 mg Oral Daily Karmen Bongo, MD   40 mg at 09/05/21 0944   polyvinyl alcohol (LIQUIFILM TEARS) 1.4 % ophthalmic solution 1  drop  1 drop Both Eyes PRN Karmen Bongo, MD       pregabalin (LYRICA) capsule 300 mg  300 mg Oral BID Karmen Bongo, MD   300 mg at 09/05/21 0944   rosuvastatin (CRESTOR) tablet 20 mg  20 mg Oral Daily Karmen Bongo, MD   20 mg at 09/05/21 0945   sucralfate (CARAFATE) tablet 1 g  1 g Oral q AM Karmen Bongo, MD   1 g at 09/05/21 0616   traMADol (ULTRAM) tablet 50 mg  50 mg Oral Q6H PRN Karmen Bongo, MD   50 mg at 09/03/21 9449   valACYclovir (VALTREX) tablet 1,000 mg  1,000 mg Oral Daily Karmen Bongo, MD   1,000 mg at 09/05/21 0945   venlafaxine XR (EFFEXOR-XR) 24 hr capsule 150 mg  150 mg Oral BID Karmen Bongo, MD   150 mg at 09/05/21 0944   ziprasidone (GEODON) capsule 60 mg  60 mg Oral QPC supper Karmen Bongo, MD   60 mg at 09/04/21 1757     Discharge Medications: Please see discharge summary for a list of discharge medications.  Relevant Imaging Results:  Relevant Lab Results:   Additional Information SSN# [id number]  Verdell Carmine, RN

## 2021-09-06 ENCOUNTER — Inpatient Hospital Stay (HOSPITAL_COMMUNITY): Payer: Medicare Other

## 2021-09-06 DIAGNOSIS — G894 Chronic pain syndrome: Secondary | ICD-10-CM | POA: Diagnosis not present

## 2021-09-06 DIAGNOSIS — I10 Essential (primary) hypertension: Secondary | ICD-10-CM | POA: Diagnosis not present

## 2021-09-06 DIAGNOSIS — R262 Difficulty in walking, not elsewhere classified: Secondary | ICD-10-CM | POA: Diagnosis not present

## 2021-09-06 DIAGNOSIS — R296 Repeated falls: Secondary | ICD-10-CM | POA: Diagnosis not present

## 2021-09-06 LAB — BASIC METABOLIC PANEL
Anion gap: 13 (ref 5–15)
BUN: 26 mg/dL — ABNORMAL HIGH (ref 8–23)
CO2: 27 mmol/L (ref 22–32)
Calcium: 9.4 mg/dL (ref 8.9–10.3)
Chloride: 102 mmol/L (ref 98–111)
Creatinine, Ser: 1.19 mg/dL — ABNORMAL HIGH (ref 0.44–1.00)
GFR, Estimated: 51 mL/min — ABNORMAL LOW (ref >60)
Glucose, Bld: 94 mg/dL (ref 70–99)
Potassium: 4.3 mmol/L (ref 3.5–5.1)
Sodium: 142 mmol/L (ref 135–145)

## 2021-09-06 LAB — CBC WITH DIFFERENTIAL/PLATELET
Abs Immature Granulocytes: 0.1 10*3/uL — ABNORMAL HIGH (ref 0.00–0.07)
Basophils Absolute: 0.1 10*3/uL (ref 0.0–0.1)
Basophils Relative: 1 %
Eosinophils Absolute: 0.1 10*3/uL (ref 0.0–0.5)
Eosinophils Relative: 2 %
HCT: 27.6 % — ABNORMAL LOW (ref 36.0–46.0)
Hemoglobin: 9 g/dL — ABNORMAL LOW (ref 12.0–15.0)
Lymphocytes Relative: 13 %
Lymphs Abs: 0.8 10*3/uL (ref 0.7–4.0)
MCH: 28.8 pg (ref 26.0–34.0)
MCHC: 32.6 g/dL (ref 30.0–36.0)
MCV: 88.2 fL (ref 80.0–100.0)
Metamyelocytes Relative: 1 %
Monocytes Absolute: 0.6 10*3/uL (ref 0.1–1.0)
Monocytes Relative: 9 %
Neutro Abs: 4.8 10*3/uL (ref 1.7–7.7)
Neutrophils Relative %: 74 %
Platelets: 223 10*3/uL (ref 150–400)
RBC: 3.13 MIL/uL — ABNORMAL LOW (ref 3.87–5.11)
RDW: 15.8 % — ABNORMAL HIGH (ref 11.5–15.5)
WBC: 6.5 10*3/uL (ref 4.0–10.5)
nRBC: 0 /100 WBC
nRBC: 0.5 % — ABNORMAL HIGH (ref 0.0–0.2)

## 2021-09-06 LAB — D-DIMER, QUANTITATIVE: D-Dimer, Quant: 1.04 ug/mL-FEU — ABNORMAL HIGH (ref 0.00–0.50)

## 2021-09-06 LAB — GLUCOSE, CAPILLARY
Glucose-Capillary: 119 mg/dL — ABNORMAL HIGH (ref 70–99)
Glucose-Capillary: 112 mg/dL — ABNORMAL HIGH (ref 70–99)
Glucose-Capillary: 149 mg/dL — ABNORMAL HIGH (ref 70–99)
Glucose-Capillary: 162 mg/dL — ABNORMAL HIGH (ref 70–99)

## 2021-09-06 MED ORDER — IPRATROPIUM-ALBUTEROL 0.5-2.5 (3) MG/3ML IN SOLN
3.0000 mL | Freq: Four times a day (QID) | RESPIRATORY_TRACT | Status: DC
Start: 2021-09-06 — End: 2021-09-06

## 2021-09-06 NOTE — Progress Notes (Signed)
Mobility Specialist Progress Note:   09/06/21 1110  Mobility  Activity Transferred from bed to chair  Level of Assistance +2 (takes two people)  Assistive Device MaxiMove  Activity Response Tolerated well  $Mobility charge 1 Mobility   Pt agreeable to transfer to chair via maximove at this time as PT session planned for 1300. Pt tolerated well. Left with all needs met.   Nelta Numbers Acute Rehab Secure Chat or Office Phone: [phone]

## 2021-09-06 NOTE — Care Management Important Message (Signed)
Important Message  Patient Details  Name: [name] MRN: [id number] Date of Birth: 1955-10-28   Medicare Important Message Given:  Yes     Orbie Pyo 09/06/2021, 4:26 PM

## 2021-09-06 NOTE — Progress Notes (Signed)
Progress Note   Patient: [name] BVA:[id number] DOB: 04-02-1955 DOA: 08/31/2021     4 DOS: the patient was seen and examined on 09/06/2021   Brief hospital course: 66 y.o. female with medical history significant of remote breast CA; morbid obesity;  chronic back pain; stage 3 CKD; HTN; HLD; hypothyroidism; OSA on CPAP; DM; and schizophrenia presenting with falls.  She reports that she has a right prior knee surgery that has failed and cannot be surgically repaired as well as a right foot ligamentous injury that cannot be repaired.  Her right leg has been giving out when she tries to walk from bathroom to chair - she is consistently one step short before her leg gives out.  EMS has been out to help her up multiple times in the last few weeks and brought her to the ER. On further questioning, pt reports her primary complaint had been increasing LE swelling and weakness. Pt at baseline mobilizes via electric wheelchair and ambulates only to get to the bathroom.   Assessment and Plan:  Ambulatory dysfunction Patient with reported joint dysfunction of R knee and ligamentous injury of R foot - both creating gait instability PT/OT consulted with recs for SNF   Hypoxia likely secondary to pulm HTN/obesity hypoventilation Pt is O2 naive, wean O2 if able Did not tolerate hospital provided cpap Initial chest imaging with concerns of possible bronchitis, however no significant symptoms F/u CT chest with findings of cardiomegaly, gross enlargement of main pulm arter consistent with pulm HTN Mild to mod AS on 2d echo with normal LVEF Improving with IV lasix, cont to diurese as tolerated   Chronic pain Continue Belbuca, tramadol, Lyrica, cyclobenzaprine (qhs)/methocarbamol (TID); will not add additional opiates at this time   Schizophrenia Continue home mood meds - doxepin, Ativan, Effexor, Geodon    HTN Hold amlodipine and Hyzaar for now  BP stable and controlled   HLD Continue Crestor    Hypothyroidism Continue Synthroid   OSA Continue CPAP, unable to tolerate   DM Recent A1c was 9.6, poor control Continue NPH, SSI, hypoglycemic protocol hold Trulicity while in hospital   Lung nodule ruled out Initially seen on CXR Reviewed f/u CT chest. Bandlike scarring or atelectasis noted with no evidence of mass   Stage 3b CKD LE edema noted at presentation Tolerating trial of lasix Monitor BMP closely  Anemia of chronic kidney disease Hemoglobin around baseline, but drop from about 5 months ago Daily CBC, monitor closely  Morbid obesity Body mass index is 53.25 kg/m.[name]  Lifestyle modification advised Outpatient PCP/bariatric medicine f/u encouraged      Subjective:  Patient denies any new complaints.  Physical Exam: Vitals:   09/06/21 0828 09/06/21 0900 09/06/21 0915 09/06/21 1619  BP: 117/62   122/67  Pulse: 90   89  Resp: 19   17  Temp: 98.7 F (37.1 C)   98.5 F (36.9 C)  TempSrc: Oral   Oral  SpO2: (!) 87% (!) 87% 95% 95%  Weight:      Height:       General: NAD, obese Cardiovascular: S1, S2 present Respiratory: CTAB Abdomen: Soft, nontender, nondistended, bowel sounds present Musculoskeletal: Noted bilateral pedal edema noted, chronic venous stasis changes on BLE Skin: As mentioned above Psychiatry: Fair mood      Data Reviewed:  Labs reviewed: Cr 1.29, Hgb 8.3, K 3.6    Family Communication: None at bedside  Disposition: Status is: Inpatient Continue inpatient stay because: Severity of illness  Planned  Discharge Destination: Skilled nursing facility    Author: Alma Friendly, MD 09/06/2021 6:53 PM  For on call review [email or web address].

## 2021-09-06 NOTE — Progress Notes (Signed)
Physical Therapy Treatment Patient Details Name: [name] MRN: [id number] DOB: 07-14-1955 Today's Date: 09/06/2021   History of Present Illness 66 y/o female admitted secondary to multiple falls. Workup pending. PMH includes Breast cancer, HSV  infection, Memory difficulty, OSA on CPAP,  Schizophrenia, Secondary parkinsonism, and Type II diabetes mellitus.    PT Comments    Patient making progress towards physical therapy goals. Patient in chair on arrival with goal of lateral scoot transfer back to bed. Able to scoot towards bed on L side with minA+2 and increased time to complete due to poor activity tolerance. After seated rest break, patient able to stand from low bed surface with modA+2 and maintain standing x 5 seconds prior to needing to sit due to B knee pain. Patient seemed encouraged by today's progress. Continue to recommend SNF for ongoing Physical Therapy.       Recommendations for follow up therapy are one component of a multi-disciplinary discharge planning process, led by the attending physician.  Recommendations may be updated based on patient status, additional functional criteria and insurance authorization.  Follow Up Recommendations  Skilled nursing-short term rehab (<3 hours/day) Can patient physically be transported by private vehicle: No   Assistance Recommended at Discharge Frequent or constant Supervision/Assistance  Patient can return home with the following Two people to help with walking and/or transfers;Two people to help with bathing/dressing/bathroom;Assistance with cooking/housework;Assist for transportation;Help with stairs or ramp for entrance   Equipment Recommendations  Other (comment) (consider sliding board)    Recommendations for Other Services       Precautions / Restrictions Precautions Precautions: Fall Restrictions Weight Bearing Restrictions: No     Mobility  Bed Mobility Overal bed mobility: Needs Assistance Bed Mobility: Sit  to Supine       Sit to supine: Mod assist   General bed mobility comments: assist for LE management and repositioning of trunk    Transfers Overall transfer level: Needs assistance Equipment used: None, Rolling Shaynah Hund (2 wheels) Transfers: Bed to chair/wheelchair/BSC, Sit to/from Stand Sit to Stand: Mod assist, +2 physical assistance, +2 safety/equipment          Lateral/Scoot Transfers: Min assist, +2 physical assistance, +2 safety/equipment General transfer comment: in chair on arrival. Lateral scoot transfer to L back to the bed with minA+2. Increased time to complete but able to make small incremental scoots to complete transfer. Assist near end to get R hip fully onto bed. Attempted standing with patient able to stand at EOB with modA+2 for 5 seconds    Ambulation/Gait                   Stairs             Wheelchair Mobility    Modified Rankin (Stroke Patients Only)       Balance Overall balance assessment: Needs assistance Sitting-balance support: No upper extremity supported, Feet supported Sitting balance-Leahy Scale: Good     Standing balance support: Bilateral upper extremity supported, Reliant on assistive device for balance Standing balance-Leahy Scale: Poor Standing balance comment: reliant on UE support in static standing at EOB                            Cognition Arousal/Alertness: Awake/alert Behavior During Therapy: WFL for tasks assessed/performed Overall Cognitive Status: No family/caregiver present to determine baseline cognitive functioning  Exercises      General Comments        Pertinent Vitals/Pain Pain Assessment Pain Assessment: Faces Faces Pain Scale: Hurts whole lot Pain Location: B knees in standing Pain Descriptors / Indicators: Discomfort, Grimacing Pain Intervention(s): Monitored during session, Repositioned, Limited activity within  patient's tolerance    Home Living                          Prior Function            PT Goals (current goals can now be found in the care plan section) Acute Rehab PT Goals PT Goal Formulation: With patient Time For Goal Achievement: 09/14/21 Potential to Achieve Goals: Good Progress towards PT goals: Progressing toward goals    Frequency    Min 2X/week      PT Plan Current plan remains appropriate    Co-evaluation              AM-PAC PT "6 Clicks" Mobility   Outcome Measure  Help needed turning from your back to your side while in a flat bed without using bedrails?: A Lot Help needed moving from lying on your back to sitting on the side of a flat bed without using bedrails?: A Lot Help needed moving to and from a bed to a chair (including a wheelchair)?: Total Help needed standing up from a chair using your arms (e.g., wheelchair or bedside chair)?: Total Help needed to walk in hospital room?: Total Help needed climbing 3-5 steps with a railing? : Total 6 Click Score: 8    End of Session Equipment Utilized During Treatment: Oxygen Activity Tolerance: Patient tolerated treatment well Patient left: in bed;with call bell/phone within reach Nurse Communication: Mobility status;Need for lift equipment PT Visit Diagnosis: Other abnormalities of gait and mobility (R26.89);History of falling (Z91.81);Muscle weakness (generalized) (M62.81);Difficulty in walking, not elsewhere classified (R26.2);Pain Pain - Right/Left: Right Pain - part of body: Knee     Time: 6720-9470 PT Time Calculation (min) (ACUTE ONLY): 24 min  Charges:  $Therapeutic Activity: 23-37 mins                     Glenn Gullickson A. Gilford Rile PT, DPT Acute Rehabilitation Services Office [phone]    Linna Hoff 09/06/2021, 1:37 PM

## 2021-09-06 NOTE — Plan of Care (Signed)
  Problem: Skin Integrity: Goal: Risk for impaired skin integrity will decrease Outcome: Progressing   Problem: Tissue Perfusion: Goal: Adequacy of tissue perfusion will improve Outcome: Progressing

## 2021-09-07 DIAGNOSIS — G894 Chronic pain syndrome: Secondary | ICD-10-CM | POA: Diagnosis not present

## 2021-09-07 DIAGNOSIS — I10 Essential (primary) hypertension: Secondary | ICD-10-CM | POA: Diagnosis not present

## 2021-09-07 DIAGNOSIS — R262 Difficulty in walking, not elsewhere classified: Secondary | ICD-10-CM | POA: Diagnosis not present

## 2021-09-07 DIAGNOSIS — R296 Repeated falls: Secondary | ICD-10-CM | POA: Diagnosis not present

## 2021-09-07 LAB — BASIC METABOLIC PANEL
Anion gap: 9 (ref 5–15)
BUN: 25 mg/dL — ABNORMAL HIGH (ref 8–23)
CO2: 30 mmol/L (ref 22–32)
Calcium: 9 mg/dL (ref 8.9–10.3)
Chloride: 102 mmol/L (ref 98–111)
Creatinine, Ser: 1.13 mg/dL — ABNORMAL HIGH (ref 0.44–1.00)
GFR, Estimated: 54 mL/min — ABNORMAL LOW (ref >60)
Glucose, Bld: 138 mg/dL — ABNORMAL HIGH (ref 70–99)
Potassium: 4 mmol/L (ref 3.5–5.1)
Sodium: 141 mmol/L (ref 135–145)

## 2021-09-07 LAB — CBC WITH DIFFERENTIAL/PLATELET
Abs Immature Granulocytes: 0.05 10*3/uL (ref 0.00–0.07)
Basophils Absolute: 0 10*3/uL (ref 0.0–0.1)
Basophils Relative: 1 %
Eosinophils Absolute: 0.3 10*3/uL (ref 0.0–0.5)
Eosinophils Relative: 5 %
HCT: 26.3 % — ABNORMAL LOW (ref 36.0–46.0)
Hemoglobin: 8.3 g/dL — ABNORMAL LOW (ref 12.0–15.0)
Immature Granulocytes: 1 %
Lymphocytes Relative: 19 %
Lymphs Abs: 1.2 10*3/uL (ref 0.7–4.0)
MCH: 28 pg (ref 26.0–34.0)
MCHC: 31.6 g/dL (ref 30.0–36.0)
MCV: 88.9 fL (ref 80.0–100.0)
Monocytes Absolute: 0.6 10*3/uL (ref 0.1–1.0)
Monocytes Relative: 10 %
Neutro Abs: 3.9 10*3/uL (ref 1.7–7.7)
Neutrophils Relative %: 64 %
Platelets: 231 10*3/uL (ref 150–400)
RBC: 2.96 MIL/uL — ABNORMAL LOW (ref 3.87–5.11)
RDW: 15.4 % (ref 11.5–15.5)
WBC: 6 10*3/uL (ref 4.0–10.5)
nRBC: 0 % (ref 0.0–0.2)

## 2021-09-07 LAB — GLUCOSE, CAPILLARY
Glucose-Capillary: 120 mg/dL — ABNORMAL HIGH (ref 70–99)
Glucose-Capillary: 136 mg/dL — ABNORMAL HIGH (ref 70–99)
Glucose-Capillary: 145 mg/dL — ABNORMAL HIGH (ref 70–99)
Glucose-Capillary: 176 mg/dL — ABNORMAL HIGH (ref 70–99)

## 2021-09-07 NOTE — Plan of Care (Signed)
  Problem: Education: Goal: Ability to describe self-care measures that may prevent or decrease complications (Diabetes Survival Skills Education) will improve Outcome: Progressing Goal: Individualized Educational Video(s) Outcome: Progressing   Problem: Coping: Goal: Ability to adjust to condition or change in health will improve Outcome: Progressing   Problem: Fluid Volume: Goal: Ability to maintain a balanced intake and output will improve Outcome: Progressing   Problem: Health Behavior/Discharge Planning: Goal: Ability to identify and utilize available resources and services will improve Outcome: Progressing Goal: Ability to manage health-related needs will improve Outcome: Progressing   Problem: Metabolic: Goal: Ability to maintain appropriate glucose levels will improve Outcome: Progressing   Problem: Nutritional: Goal: Maintenance of adequate nutrition will improve Outcome: Progressing Goal: Progress toward achieving an optimal weight will improve Outcome: Progressing   Problem: Nutritional: Goal: Progress toward achieving an optimal weight will improve Outcome: Progressing   Problem: Skin Integrity: Goal: Risk for impaired skin integrity will decrease Outcome: Progressing   Problem: Tissue Perfusion: Goal: Adequacy of tissue perfusion will improve Outcome: Progressing   Problem: Education: Goal: Knowledge of General Education information will improve Description: Including pain rating scale, medication(s)/side effects and non-pharmacologic comfort measures Outcome: Progressing   Problem: Health Behavior/Discharge Planning: Goal: Ability to manage health-related needs will improve Outcome: Progressing   Problem: Clinical Measurements: Goal: Ability to maintain clinical measurements within normal limits will improve Outcome: Progressing Goal: Will remain free from infection Outcome: Progressing Goal: Diagnostic test results will improve Outcome:  Progressing Goal: Respiratory complications will improve Outcome: Progressing Goal: Cardiovascular complication will be avoided Outcome: Progressing   Problem: Clinical Measurements: Goal: Ability to maintain clinical measurements within normal limits will improve Outcome: Progressing Goal: Will remain free from infection Outcome: Progressing Goal: Diagnostic test results will improve Outcome: Progressing Goal: Respiratory complications will improve Outcome: Progressing Goal: Cardiovascular complication will be avoided Outcome: Progressing

## 2021-09-07 NOTE — Progress Notes (Signed)
Progress Note   Patient: [name] FMB:[id number] DOB: [birth year] DOA: 08/31/2021     5 DOS: the patient was seen and examined on 09/07/2021   Brief hospital course: 66 y.o. female with medical history significant of remote breast CA; morbid obesity;  chronic back pain; stage 3 CKD; HTN; HLD; hypothyroidism; OSA on CPAP; DM; and schizophrenia presenting with falls.  She reports that she has a right prior knee surgery that has failed and cannot be surgically repaired as well as a right foot ligamentous injury that cannot be repaired.  Her right leg has been giving out when she tries to walk from bathroom to chair - she is consistently one step short before her leg gives out.  EMS has been out to help her up multiple times in the last few weeks and brought her to the ER. On further questioning, pt reports her primary complaint had been increasing LE swelling and weakness. Pt at baseline mobilizes via electric wheelchair and ambulates only to get to the bathroom.   Assessment and Plan:  Ambulatory dysfunction Patient with reported joint dysfunction of R knee and ligamentous injury of R foot - both creating gait instability PT/OT consulted with recs for SNF   Hypoxia likely secondary to pulm HTN/obesity hypoventilation Pt is O2 naive, wean O2 if able Did not tolerate hospital provided cpap Initial chest imaging with concerns of possible bronchitis, however no significant symptoms F/u CT chest with findings of cardiomegaly, gross enlargement of main pulm arter consistent with pulm HTN Mild to mod AS on 2d echo with normal LVEF Improving with IV lasix, cont to diurese as tolerated   Chronic pain Continue Belbuca, tramadol, Lyrica, cyclobenzaprine (qhs)/methocarbamol (TID); will not add additional opiates at this time   Schizophrenia Continue home mood meds - doxepin, Ativan, Effexor, Geodon    HTN Hold amlodipine and Hyzaar for now  BP stable and controlled   HLD Continue Crestor    Hypothyroidism Continue Synthroid   OSA Continue CPAP, unable to tolerate   DM Recent A1c was 9.6, poor control Continue NPH, SSI, hypoglycemic protocol hold Trulicity while in hospital   Lung nodule ruled out Initially seen on CXR Reviewed f/u CT chest. Bandlike scarring or atelectasis noted with no evidence of mass   Stage 3b CKD LE edema noted at presentation Tolerating trial of lasix Monitor BMP closely  Anemia of chronic kidney disease Hemoglobin around baseline, but drop from about 5 months ago Daily CBC, monitor closely  Morbid obesity Body mass index is 53.25 kg/m.[name]  Lifestyle modification advised Outpatient PCP/bariatric medicine f/u encouraged      Subjective:  Patient denies any new complaints.  Physical Exam: Vitals:   09/06/21 2015 09/07/21 0631 09/07/21 0801 09/07/21 1606  BP: 107/63 134/86 117/67 121/68  Pulse: 89 77 87 88  Resp: _0 Temp: 99.3 F (37.4 C) 98.6 F (37 C) 98.4 F (36.9 C) 97.9 F (36.6 C)  TempSrc: Oral Oral Oral Oral  SpO2: 94% 98% 96% 96%  Weight:      Height:       General: NAD, obese Cardiovascular: S1, S2 present Respiratory: CTAB Abdomen: Soft, nontender, nondistended, bowel sounds present Musculoskeletal: Noted bilateral pedal edema noted, chronic venous stasis changes on BLE Skin: As mentioned above Psychiatry: Fair mood     Family Communication: None at bedside  Disposition: Status is: Inpatient Continue inpatient stay because: Severity of illness  Planned Discharge Destination: Skilled nursing facility  Author: Alma Friendly, MD  09/07/2021 5:45 PM  For on call review [email or web address].

## 2021-09-07 NOTE — Progress Notes (Signed)
Placed patient on CPAP for the night with oxygen set at 3lpm.

## 2021-09-08 DIAGNOSIS — G894 Chronic pain syndrome: Secondary | ICD-10-CM | POA: Diagnosis not present

## 2021-09-08 DIAGNOSIS — R262 Difficulty in walking, not elsewhere classified: Secondary | ICD-10-CM | POA: Diagnosis not present

## 2021-09-08 DIAGNOSIS — I10 Essential (primary) hypertension: Secondary | ICD-10-CM | POA: Diagnosis not present

## 2021-09-08 DIAGNOSIS — R296 Repeated falls: Secondary | ICD-10-CM | POA: Diagnosis not present

## 2021-09-08 LAB — GLUCOSE, CAPILLARY
Glucose-Capillary: 90 mg/dL (ref 70–99)
Glucose-Capillary: 120 mg/dL — ABNORMAL HIGH (ref 70–99)
Glucose-Capillary: 216 mg/dL — ABNORMAL HIGH (ref 70–99)
Glucose-Capillary: 138 mg/dL — ABNORMAL HIGH (ref 70–99)

## 2021-09-08 LAB — CBC WITH DIFFERENTIAL/PLATELET
Abs Immature Granulocytes: 0.06 10*3/uL (ref 0.00–0.07)
Basophils Absolute: 0.1 10*3/uL (ref 0.0–0.1)
Basophils Relative: 1 %
Eosinophils Absolute: 0.3 10*3/uL (ref 0.0–0.5)
Eosinophils Relative: 5 %
HCT: 26.5 % — ABNORMAL LOW (ref 36.0–46.0)
Hemoglobin: 8.4 g/dL — ABNORMAL LOW (ref 12.0–15.0)
Immature Granulocytes: 1 %
Lymphocytes Relative: 23 %
Lymphs Abs: 1.3 10*3/uL (ref 0.7–4.0)
MCH: 28.1 pg (ref 26.0–34.0)
MCHC: 31.7 g/dL (ref 30.0–36.0)
MCV: 88.6 fL (ref 80.0–100.0)
Monocytes Absolute: 0.5 10*3/uL (ref 0.1–1.0)
Monocytes Relative: 10 %
Neutro Abs: 3.4 10*3/uL (ref 1.7–7.7)
Neutrophils Relative %: 60 %
Platelets: 256 10*3/uL (ref 150–400)
RBC: 2.99 MIL/uL — ABNORMAL LOW (ref 3.87–5.11)
RDW: 15.6 % — ABNORMAL HIGH (ref 11.5–15.5)
WBC: 5.6 10*3/uL (ref 4.0–10.5)
nRBC: 0.4 % — ABNORMAL HIGH (ref 0.0–0.2)

## 2021-09-08 LAB — BASIC METABOLIC PANEL
Anion gap: 9 (ref 5–15)
BUN: 27 mg/dL — ABNORMAL HIGH (ref 8–23)
CO2: 29 mmol/L (ref 22–32)
Calcium: 9.3 mg/dL (ref 8.9–10.3)
Chloride: 104 mmol/L (ref 98–111)
Creatinine, Ser: 1.18 mg/dL — ABNORMAL HIGH (ref 0.44–1.00)
GFR, Estimated: 51 mL/min — ABNORMAL LOW (ref >60)
Glucose, Bld: 129 mg/dL — ABNORMAL HIGH (ref 70–99)
Potassium: 3.8 mmol/L (ref 3.5–5.1)
Sodium: 142 mmol/L (ref 135–145)

## 2021-09-08 NOTE — Progress Notes (Signed)
Pt refused to wear the CPAP tonight. States she had trouble with it and would like to have a rest from it tonight. Will call if needed.

## 2021-09-08 NOTE — Progress Notes (Signed)
Progress Note   Patient: [name] IWL:[id number] DOB: [birth year] DOA: 08/31/2021     6 DOS: the patient was seen and examined on 09/08/2021   Brief hospital course: 66 y.o. female with medical history significant of remote breast CA; morbid obesity;  chronic back pain; stage 3 CKD; HTN; HLD; hypothyroidism; OSA on CPAP; DM; and schizophrenia presenting with falls.  She reports that she has a right prior knee surgery that has failed and cannot be surgically repaired as well as a right foot ligamentous injury that cannot be repaired.  Her right leg has been giving out when she tries to walk from bathroom to chair - she is consistently one step short before her leg gives out.  EMS has been out to help her up multiple times in the last few weeks and brought her to the ER. On further questioning, pt reports her primary complaint had been increasing LE swelling and weakness. Pt at baseline mobilizes via electric wheelchair and ambulates only to get to the bathroom.   Assessment and Plan:  Ambulatory dysfunction Patient with reported joint dysfunction of R knee and ligamentous injury of R foot - both creating gait instability PT/OT consulted with recs for SNF   Hypoxia likely secondary to pulm HTN/obesity hypoventilation Pt is O2 naive, wean O2 if able Did not tolerate hospital provided cpap Initial chest imaging with concerns of possible bronchitis, however no significant symptoms F/u CT chest with findings of cardiomegaly, gross enlargement of main pulm arter consistent with pulm HTN Mild to mod AS on 2d echo with normal LVEF Improving with IV lasix, cont to diurese as tolerated   Chronic pain Continue Belbuca, tramadol, Lyrica, cyclobenzaprine (qhs)/methocarbamol (TID); will not add additional opiates at this time   Schizophrenia Continue home mood meds - doxepin, Ativan, Effexor, Geodon    HTN Hold amlodipine and Hyzaar for now  BP stable and controlled   HLD Continue Crestor    Hypothyroidism Continue Synthroid   OSA Continue CPAP, unable to tolerate   DM Recent A1c was 9.6, poor control Continue NPH, SSI, hypoglycemic protocol hold Trulicity while in hospital   Lung nodule ruled out Initially seen on CXR Reviewed f/u CT chest. Bandlike scarring or atelectasis noted with no evidence of mass   Stage 3b CKD LE edema noted at presentation Tolerating trial of lasix Monitor BMP closely  Anemia of chronic kidney disease Hemoglobin around baseline, but drop from about 5 months ago Daily CBC, monitor closely  Morbid obesity Body mass index is 53.25 kg/m.[name]  Lifestyle modification advised Outpatient PCP/bariatric medicine f/u encouraged      Subjective:  Patient denies any new complaints.    Physical Exam: Vitals:   09/07/21 1953 09/07/21 2257 09/08/21 0503 09/08/21 0757  BP: 115/62  130/86 117/65  Pulse: 85 81 77 81  Resp: _0 Temp: 98 F (36.7 C)  98.6 F (37 C) 98.4 F (36.9 C)  TempSrc: Oral  Oral Oral  SpO2: 92% 94% 92% 95%  Weight:      Height:       General: NAD, obese Cardiovascular: S1, S2 present Respiratory: CTAB Abdomen: Soft, nontender, nondistended, bowel sounds present Musculoskeletal: Noted bilateral pedal edema noted, chronic venous stasis changes on BLE Skin: As mentioned above Psychiatry: Fair mood     Family Communication: None at bedside  Disposition: Status is: Inpatient Continue inpatient stay because: Severity of illness  Planned Discharge Destination: Skilled nursing facility  Author: Alma Friendly, MD 09/08/2021  12:51 PM  For on call review [email or web address].

## 2021-09-09 DIAGNOSIS — R262 Difficulty in walking, not elsewhere classified: Secondary | ICD-10-CM | POA: Diagnosis not present

## 2021-09-09 DIAGNOSIS — G894 Chronic pain syndrome: Secondary | ICD-10-CM | POA: Diagnosis not present

## 2021-09-09 DIAGNOSIS — R296 Repeated falls: Secondary | ICD-10-CM | POA: Diagnosis not present

## 2021-09-09 DIAGNOSIS — I10 Essential (primary) hypertension: Secondary | ICD-10-CM | POA: Diagnosis not present

## 2021-09-09 LAB — GLUCOSE, CAPILLARY
Glucose-Capillary: 110 mg/dL — ABNORMAL HIGH (ref 70–99)
Glucose-Capillary: 118 mg/dL — ABNORMAL HIGH (ref 70–99)
Glucose-Capillary: 145 mg/dL — ABNORMAL HIGH (ref 70–99)
Glucose-Capillary: 148 mg/dL — ABNORMAL HIGH (ref 70–99)

## 2021-09-09 LAB — CBC WITH DIFFERENTIAL/PLATELET
Abs Immature Granulocytes: 0.07 10*3/uL (ref 0.00–0.07)
Basophils Absolute: 0.1 10*3/uL (ref 0.0–0.1)
Basophils Relative: 1 %
Eosinophils Absolute: 0.4 10*3/uL (ref 0.0–0.5)
Eosinophils Relative: 6 %
HCT: 27.2 % — ABNORMAL LOW (ref 36.0–46.0)
Hemoglobin: 8.7 g/dL — ABNORMAL LOW (ref 12.0–15.0)
Immature Granulocytes: 1 %
Lymphocytes Relative: 23 %
Lymphs Abs: 1.5 10*3/uL (ref 0.7–4.0)
MCH: 28 pg (ref 26.0–34.0)
MCHC: 32 g/dL (ref 30.0–36.0)
MCV: 87.5 fL (ref 80.0–100.0)
Monocytes Absolute: 0.5 10*3/uL (ref 0.1–1.0)
Monocytes Relative: 8 %
Neutro Abs: 4 10*3/uL (ref 1.7–7.7)
Neutrophils Relative %: 61 %
Platelets: 271 10*3/uL (ref 150–400)
RBC: 3.11 MIL/uL — ABNORMAL LOW (ref 3.87–5.11)
RDW: 15.7 % — ABNORMAL HIGH (ref 11.5–15.5)
WBC: 6.6 10*3/uL (ref 4.0–10.5)
nRBC: 0.3 % — ABNORMAL HIGH (ref 0.0–0.2)

## 2021-09-09 LAB — BASIC METABOLIC PANEL
Anion gap: 6 (ref 5–15)
BUN: 28 mg/dL — ABNORMAL HIGH (ref 8–23)
CO2: 30 mmol/L (ref 22–32)
Calcium: 9.4 mg/dL (ref 8.9–10.3)
Chloride: 107 mmol/L (ref 98–111)
Creatinine, Ser: 1.5 mg/dL — ABNORMAL HIGH (ref 0.44–1.00)
GFR, Estimated: 38 mL/min — ABNORMAL LOW (ref >60)
Glucose, Bld: 122 mg/dL — ABNORMAL HIGH (ref 70–99)
Potassium: 4.1 mmol/L (ref 3.5–5.1)
Sodium: 143 mmol/L (ref 135–145)

## 2021-09-09 NOTE — Progress Notes (Signed)
Physical Therapy Treatment Patient Details Name: [name] MRN: [id number] DOB: August 18, 1955 Today's Date: 09/09/2021   History of Present Illness 66 y/o female admitted secondary to multiple falls. Admitted with Hypoxia likely secondary to pulm HTN/obesity hypoventilation and ambulation dysfunction due to RLE injuries. PMH includes Breast cancer, HSV  infection, Memory difficulty, OSA on CPAP,  Schizophrenia, Secondary parkinsonism, and Type II diabetes mellitus.    PT Comments    Patient progressing slowly towards PT goals. Session focused on standing and transfers. Tolerated standing from EOB x2 with assist of 2 starting with mod A and then digressing to max A of 2 due to pain/weakness and fatigue. Able to perform step pivot transfer to get to chair with Mod A of 2 for balance and RW management. Noted to have bil knee instability and pain. Sp02 remained in 90s on 2L/min 02 [place]. Chronic pain and weakness continue to be limiting factors putting pt at increased risk for falls. Continue to recommend sNF. Recommend lateral scoot tx back to bed with nursing. Will follow.   Recommendations for follow up therapy are one component of a multi-disciplinary discharge planning process, led by the attending physician.  Recommendations may be updated based on patient status, additional functional criteria and insurance authorization.  Follow Up Recommendations  Skilled nursing-short term rehab (<3 hours/day) Can patient physically be transported by private vehicle: No   Assistance Recommended at Discharge Frequent or constant Supervision/Assistance  Patient can return home with the following Two people to help with walking and/or transfers;Two people to help with bathing/dressing/bathroom;Assistance with cooking/housework;Assist for transportation;Help with stairs or ramp for entrance   Equipment Recommendations  Other (comment) (consider sliding board?)    Recommendations for Other Services        Precautions / Restrictions Precautions Precautions: Fall Precaution Comments: Unable to recall how many falls she has had within the last month. 4 within a day Restrictions Weight Bearing Restrictions: No     Mobility  Bed Mobility Overal bed mobility: Needs Assistance Bed Mobility: Supine to Sit     Supine to sit: Min assist, HOB elevated     General bed mobility comments: Assist to bring LLE to EOB, increased time and heavy use of rail for support.    Transfers Overall transfer level: Needs assistance Equipment used: Rolling walker (2 wheels) Transfers: Bed to chair/wheelchair/BSC, Sit to/from Stand Sit to Stand: Mod assist, Max assist, +2 physical assistance   Step pivot transfers: Mod assist, +2 physical assistance       General transfer comment: Mod A of 2 to stand from EOB initially digressing to max A of 2 on second bout, cues for hip/knee extension and upright as pt prefers leaning on RW with forearms. Bil knee instability noted. Able to step pivot to get to chair with mod A of 2 with uncontrolled descent into chair. Limited by pain.    Ambulation/Gait               General Gait Details: Deferred   Stairs             Wheelchair Mobility    Modified Rankin (Stroke Patients Only)       Balance Overall balance assessment: Needs assistance Sitting-balance support: Feet supported, No upper extremity supported Sitting balance-Leahy Scale: Good     Standing balance support: During functional activity Standing balance-Leahy Scale: Poor Standing balance comment: reliant on UE support in static standing and external support. Able to stand for ~15 sec.  Cognition Arousal/Alertness: Awake/alert Behavior During Therapy: Flat affect Overall Cognitive Status: No family/caregiver present to determine baseline cognitive functioning                                 General Comments: pt reports feeling  very sleepy. pt noted to have pain medications within 15 minutes of arrival, laughs appropriately        Exercises General Exercises - Lower Extremity Long Arc Quad: AROM, Both, 5 reps, Seated    General Comments General comments (skin integrity, edema, etc.): Sp02 remained >92% on 2L/min 02 [place]      Pertinent Vitals/Pain Pain Assessment Pain Assessment: Faces Faces Pain Scale: Hurts even more Pain Location: bil knees L shoulder Pain Descriptors / Indicators: Discomfort, Grimacing Pain Intervention(s): Monitored during session, Premedicated before session, Limited activity within patient's tolerance, Repositioned    Home Living                          Prior Function            PT Goals (current goals can now be found in the care plan section) Progress towards PT goals: Progressing toward goals    Frequency    Min 2X/week      PT Plan Current plan remains appropriate    Co-evaluation PT/OT/SLP Co-Evaluation/Treatment: Yes Reason for Co-Treatment: For patient/therapist safety;To address functional/ADL transfers;Complexity of the patient's impairments (multi-system involvement) PT goals addressed during session: Mobility/safety with mobility;Balance OT goals addressed during session: ADL's and self-care;Proper use of Adaptive equipment and DME;Strengthening/ROM      AM-PAC PT "6 Clicks" Mobility   Outcome Measure  Help needed turning from your back to your side while in a flat bed without using bedrails?: A Little Help needed moving from lying on your back to sitting on the side of a flat bed without using bedrails?: A Little Help needed moving to and from a bed to a chair (including a wheelchair)?: Total Help needed standing up from a chair using your arms (e.g., wheelchair or bedside chair)?: Total Help needed to walk in hospital room?: Total Help needed climbing 3-5 steps with a railing? : Total 6 Click Score: 10    End of Session Equipment  Utilized During Treatment: Oxygen;Gait belt Activity Tolerance: Patient tolerated treatment well;Patient limited by pain Patient left: in chair;with call bell/phone within reach;with chair alarm set Nurse Communication: Mobility status PT Visit Diagnosis: Other abnormalities of gait and mobility (R26.89);History of falling (Z91.81);Muscle weakness (generalized) (M62.81);Difficulty in walking, not elsewhere classified (R26.2);Pain Pain - Right/Left: Left (bil) Pain - part of body: Knee;Shoulder     Time: 4888-9169 PT Time Calculation (min) (ACUTE ONLY): 29 min  Charges:  $Therapeutic Activity: 8-22 mins                     Marisa Severin, PT, DPT Acute Rehabilitation Services Secure chat preferred Office [place] 09/09/2021, 1:41 PM

## 2021-09-09 NOTE — Progress Notes (Addendum)
Nutrition Follow-up  DOCUMENTATION CODES:  Morbid obesity  INTERVENTION:  Continue current diet as ordered, discontinue double protein portions MVI with minerals daily  NUTRITION DIAGNOSIS:  Increased nutrient needs related to acute illness as evidenced by estimated needs. - remains applicable  GOAL:  Patient will meet greater than or equal to 90% of their needs - progressing, good intake of meals  MONITOR:   PO intake  REASON FOR ASSESSMENT:   Consult Assessment of nutrition requirement/status  ASSESSMENT:  Pt with medical history significant of remote breast CA; morbid obesity;  chronic back pain; stage 3 CKD; HTN; HLD; hypothyroidism; OSA on CPAP; DM; and schizophrenia presenting with falls.  Pt resting in bed at time of assessment. Discussed intake since last assessment and pt reports good intake of meals and that appetite is good. Reviewed intake and pt is consuming 100% of ordered meals. Pt reports that sometimes double protein portions are overwhelming (ie receiving two omelets for breakfast). Will discontinue as needs can be met with standard meal tray.   Pt is eating well and reports no nutrition questions at this time. No further nutrition interventions are planned while pt is inpatient. If further needs arise, please place a new consult.   Average Meal Intake: 7/18-7/24: 100% intake x 8 recorded meals  Nutritionally Relevant Medications: Scheduled Meds:  calcitRIOL  0.25 mcg Oral Daily   docusate sodium  100 mg Oral BID   famotidine  40 mg Oral QPC supper   insulin aspart  0-20 Units Subcutaneous TID WC   insulin aspart  0-5 Units Subcutaneous QHS   insulin NPH Human  55 Units Subcutaneous QAC breakfast   linaclotide2  290 mcg Oral QAC breakfast   multivitamin with minerals  1 tablet Oral Daily   pantoprazole  40 mg Oral Daily   rosuvastatin  20 mg Oral Daily   sucralfate  1 g Oral q AM   Labs Reviewed: CBG ranges from 90-216 mg/dL over the last 24  hours HgbA1c: 9.6% 3/10  NUTRITION - FOCUSED PHYSICAL EXAM:  Flowsheet Row Most Recent Value  Orbital Region No depletion  Upper Arm Region No depletion  Thoracic and Lumbar Region No depletion  Buccal Region No depletion  Temple Region No depletion  Clavicle Bone Region No depletion  Clavicle and Acromion Bone Region No depletion  Scapular Bone Region No depletion  Dorsal Hand No depletion  Patellar Region No depletion  Anterior Thigh Region No depletion  Posterior Calf Region No depletion  Edema (RD Assessment) Moderate  Hair Reviewed  Eyes Reviewed  Mouth Reviewed  Skin Reviewed  Nails Reviewed       Diet Order:   Diet Order             Diet Carb Modified Fluid consistency: Thin; Room service appropriate? Yes with Assist  Diet effective now                   EDUCATION NEEDS:   Education needs have been addressed  Skin:  Skin Assessment: Reviewed RN Assessment  Last BM:  7/23, type 5  Height:  Ht Readings from Last 1 Encounters:  08/31/21 5' 5" (1.651 m)    Weight:  Wt Readings from Last 1 Encounters:  09/09/21 (!) 161.1 kg    Ideal Body Weight:  56.8 kg  BMI:  Body mass index is 59.1 kg/m.  Estimated Nutritional Needs:  Kcal:  1800-2000 Protein:  115-130 grams Fluid:  > 1.8 L    Ranell Patrick, RD,  LDN Clinical Dietitian RD pager # available in [place]  After hours/weekend pager # available in [facility]

## 2021-09-09 NOTE — Progress Notes (Signed)
Pt refused CPAP. RT informed pt if she changed her mind RT will be available to help. RT will cont to monitor.

## 2021-09-09 NOTE — Progress Notes (Signed)
Occupational Therapy Treatment Patient Details Name: [name] MRN: [id number] DOB: [birth year] Today's Date: 09/09/2021   History of present illness 66 y/o female admitted secondary to multiple falls. Workup pending. PMH includes Breast cancer, HSV  infection, Memory difficulty, OSA on CPAP,  Schizophrenia, Secondary parkinsonism, and Type II diabetes mellitus.   OT comments  Pt currently requires two therapist to pivot to chair with extensive effort. Recommendation for hoyer lift only with staff and pad is in the room. Recommendation for continued SNF as pt is unable to safely transfer to w/c at this time. Pt with multiple falls at home. Pt motivated and smiling at music played during session.    Recommendations for follow up therapy are one component of a multi-disciplinary discharge planning process, led by the attending physician.  Recommendations may be updated based on patient status, additional functional criteria and insurance authorization.    Follow Up Recommendations  Skilled nursing-short term rehab (<3 hours/day)    Assistance Recommended at Discharge Intermittent Supervision/Assistance  Patient can return home with the following  Two people to help with walking and/or transfers;Two people to help with bathing/dressing/bathroom   Equipment Recommendations  BSC/3in1    Recommendations for Other Services      Precautions / Restrictions Precautions Precautions: Fall Precaution Comments: Unable to recall how many falls she has had within the last month. 4 within a day Restrictions Weight Bearing Restrictions: No       Mobility Bed Mobility Overal bed mobility: Needs Assistance Bed Mobility: Supine to Sit     Supine to sit: Min assist, HOB elevated     General bed mobility comments: Assist to bring LLE to EOB, increased time and heavy use of rail for support.    Transfers Overall transfer level: Needs assistance Equipment used: Rolling walker (2  wheels) Transfers: Bed to chair/wheelchair/BSC, Sit to/from Stand Sit to Stand: Mod assist, Max assist, +2 physical assistance     Step pivot transfers: Mod assist, +2 physical assistance     General transfer comment: Mod A of 2 to stand from EOB initially digressing to max A of 2 on second bout, cues for hip/knee extension and upright as pt prefers leaning on RW with forearms. Bil knee instability noted. Able to step pivot to get to chair with mod A of 2 with uncontrolled descent into chair. Limited by pain.     Balance Overall balance assessment: Needs assistance Sitting-balance support: Feet supported, No upper extremity supported Sitting balance-Leahy Scale: Good     Standing balance support: During functional activity Standing balance-Leahy Scale: Poor Standing balance comment: reliant on UE support in static standing and external support. Able to stand for ~15 sec.                           ADL either performed or assessed with clinical judgement   ADL Overall ADL's : Needs assistance/impaired Eating/Feeding: Set up;Sitting   Grooming: Set up;Sitting   Upper Body Bathing: Maximal assistance   Lower Body Bathing: Total assistance   Upper Body Dressing : Maximal assistance                     General ADL Comments: pt progressed from EOB to chair with stand pivot this session    Extremity/Trunk Assessment Upper Extremity Assessment Upper Extremity Assessment: LUE deficits/detail LUE Deficits / Details: reports discomfort after during and after transfer  Vision       Perception     Praxis      Cognition Arousal/Alertness: Awake/alert Behavior During Therapy: Flat affect Overall Cognitive Status: No family/caregiver present to determine baseline cognitive functioning                                 General Comments: pt reports feeling very sleepy. pt noted to have pain medications within 15 minutes of arrival,  laughs appropriately        Exercises Exercises: General Lower Extremity    Shoulder Instructions       General Comments 2.5 L [place] utilized during session    Pertinent Vitals/ Pain       Pain Assessment Pain Assessment: Faces Faces Pain Scale: Hurts even more Pain Location: bil knees L shoulder Pain Descriptors / Indicators: Discomfort, Grimacing Pain Intervention(s): Monitored during session, Premedicated before session, Repositioned  Home Living                                          Prior Functioning/Environment              Frequency  Min 2X/week        Progress Toward Goals  OT Goals(current goals can now be found in the care plan section)  Progress towards OT goals: Progressing toward goals  Acute Rehab OT Goals Patient Stated Goal: to be able to return home OT Goal Formulation: With patient Time For Goal Achievement: 09/15/21 Potential to Achieve Goals: Good ADL Goals Pt Will Transfer to Toilet: with mod assist;squat pivot transfer;bedside commode Additional ADL Goal #1: pt will complete bed mobility supervision as precursor to adls. Additional ADL Goal #2: pt will complete sit<>Stand for 1 minute as precursor to stand pivot to w/c  Plan Discharge plan remains appropriate    Co-evaluation    PT/OT/SLP Co-Evaluation/Treatment: Yes Reason for Co-Treatment: For patient/therapist safety;To address functional/ADL transfers;Complexity of the patient's impairments (multi-system involvement) PT goals addressed during session: Mobility/safety with mobility;Balance OT goals addressed during session: ADL's and self-care;Proper use of Adaptive equipment and DME;Strengthening/ROM      AM-PAC OT "6 Clicks" Daily Activity     Outcome Measure   Help from another person eating meals?: A Little Help from another person taking care of personal grooming?: A Little Help from another person toileting, which includes using toliet, bedpan, or  urinal?: A Lot Help from another person bathing (including washing, rinsing, drying)?: A Lot Help from another person to put on and taking off regular upper body clothing?: A Lot Help from another person to put on and taking off regular lower body clothing?: A Lot 6 Click Score: 14    End of Session Equipment Utilized During Treatment: Oxygen;Gait belt;Rolling walker (2 wheels)  OT Visit Diagnosis: Unsteadiness on feet (R26.81);Muscle weakness (generalized) (M62.81);Pain   Activity Tolerance Patient tolerated treatment well   Patient Left in chair;with call bell/phone within reach;with chair alarm set   Nurse Communication Mobility status;Precautions        Time: 7121-9758 OT Time Calculation (min): 23 min  Charges: OT General Charges $OT Visit: 1 Visit OT Treatments $Self Care/Home Management : 8-22 mins   Brynn, OTR/L  Acute Rehabilitation Services Office: [phone] .   Jeri Modena 09/09/2021, 1:46 PM

## 2021-09-09 NOTE — Progress Notes (Signed)
Progress Note   Patient: [name] [id number] DOB: 09/18/1955 DOA: 08/31/2021     7 DOS: the patient was seen and examined on 09/09/2021   Brief hospital course: 66 y.o. female with medical history significant of remote breast CA; morbid obesity;  chronic back pain; stage 3 CKD; HTN; HLD; hypothyroidism; OSA on CPAP; DM; and schizophrenia presenting with falls.  She reports that she has a right prior knee surgery that has failed and cannot be surgically repaired as well as a right foot ligamentous injury that cannot be repaired.  Her right leg has been giving out when she tries to walk from bathroom to chair - she is consistently one step short before her leg gives out.  EMS has been out to help her up multiple times in the last few weeks and brought her to the ER. On further questioning, pt reports her primary complaint had been increasing LE swelling and weakness. Pt at baseline mobilizes via electric wheelchair and ambulates only to get to the bathroom.   Assessment and Plan:  Ambulatory dysfunction Patient with reported joint dysfunction of R knee and ligamentous injury of R foot - both creating gait instability PT/OT consulted with recs for SNF   Hypoxia likely secondary to pulm HTN/obesity hypoventilation Pt is O2 naive, wean O2 if able Did not tolerate hospital provided cpap Initial chest imaging with concerns of possible bronchitis, however no significant symptoms F/u CT chest with findings of cardiomegaly, gross enlargement of main pulm arter consistent with pulm HTN Mild to mod AS on 2d echo with normal LVEF Hold Lasix today given rising creatinine   Chronic pain Continue Belbuca, tramadol, Lyrica, cyclobenzaprine (qhs)/methocarbamol (TID); will not add additional opiates at this time   Schizophrenia Continue home mood meds - doxepin, Ativan, Effexor, Geodon    HTN Hold amlodipine and Hyzaar for now  BP stable and controlled   HLD Continue Crestor    Hypothyroidism Continue Synthroid   OSA Continue CPAP, unable to tolerate   DM Recent A1c was 9.6, poor control Continue NPH, SSI, hypoglycemic protocol hold Trulicity while in hospital   Lung nodule ruled out Initially seen on CXR Reviewed f/u CT chest. Bandlike scarring or atelectasis noted with no evidence of mass   Stage 3b CKD LE edema noted at presentation Monitor BMP closely  Anemia of chronic kidney disease Hemoglobin around baseline, but drop from about 5 months ago Daily CBC, monitor closely  Morbid obesity Body mass index is 53.25 kg/m.[name]  Lifestyle modification advised Outpatient PCP/bariatric medicine f/u encouraged      Subjective:  Patient denies any new complaints.    Physical Exam: Vitals:   09/08/21 2120 09/09/21 0432 09/09/21 0500 09/09/21 0819  BP: 119/68 107/60  133/77  Pulse: 80 79  86  Resp: _0 Temp: 98.2 F (36.8 C) 97.9 F (36.6 C)  98 F (36.7 C)  TempSrc: Oral Oral    SpO2: 91% 100%  97%  Weight:   (!) 161.1 kg   Height:       General: NAD, obese Cardiovascular: S1, S2 present Respiratory: CTAB Abdomen: Soft, nontender, nondistended, bowel sounds present Musculoskeletal: Noted bilateral pedal edema noted, chronic venous stasis changes on BLE Skin: As mentioned above Psychiatry: Normal mood     Family Communication: None at bedside  Disposition: Status is: Inpatient Continue inpatient stay because: Severity of illness  Planned Discharge Destination: Skilled nursing facility  Author: Alma Friendly, MD 09/09/2021 12:38 PM  For on  call review [email or web address].

## 2021-09-10 DIAGNOSIS — R262 Difficulty in walking, not elsewhere classified: Secondary | ICD-10-CM | POA: Diagnosis not present

## 2021-09-10 DIAGNOSIS — I10 Essential (primary) hypertension: Secondary | ICD-10-CM | POA: Diagnosis not present

## 2021-09-10 DIAGNOSIS — R296 Repeated falls: Secondary | ICD-10-CM | POA: Diagnosis not present

## 2021-09-10 DIAGNOSIS — G894 Chronic pain syndrome: Secondary | ICD-10-CM | POA: Diagnosis not present

## 2021-09-10 LAB — BASIC METABOLIC PANEL
Anion gap: 11 (ref 5–15)
BUN: 28 mg/dL — ABNORMAL HIGH (ref 8–23)
CO2: 26 mmol/L (ref 22–32)
Calcium: 9.4 mg/dL (ref 8.9–10.3)
Chloride: 104 mmol/L (ref 98–111)
Creatinine, Ser: 1.33 mg/dL — ABNORMAL HIGH (ref 0.44–1.00)
GFR, Estimated: 44 mL/min — ABNORMAL LOW (ref >60)
Glucose, Bld: 123 mg/dL — ABNORMAL HIGH (ref 70–99)
Potassium: 4.4 mmol/L (ref 3.5–5.1)
Sodium: 141 mmol/L (ref 135–145)

## 2021-09-10 LAB — CBC WITH DIFFERENTIAL/PLATELET
Abs Immature Granulocytes: 0.07 10*3/uL (ref 0.00–0.07)
Basophils Absolute: 0.1 10*3/uL (ref 0.0–0.1)
Basophils Relative: 1 %
Eosinophils Absolute: 0.3 10*3/uL (ref 0.0–0.5)
Eosinophils Relative: 5 %
HCT: 30.6 % — ABNORMAL LOW (ref 36.0–46.0)
Hemoglobin: 9.5 g/dL — ABNORMAL LOW (ref 12.0–15.0)
Immature Granulocytes: 1 %
Lymphocytes Relative: 23 %
Lymphs Abs: 1.3 10*3/uL (ref 0.7–4.0)
MCH: 28 pg (ref 26.0–34.0)
MCHC: 31 g/dL (ref 30.0–36.0)
MCV: 90.3 fL (ref 80.0–100.0)
Monocytes Absolute: 0.4 10*3/uL (ref 0.1–1.0)
Monocytes Relative: 7 %
Neutro Abs: 3.7 10*3/uL (ref 1.7–7.7)
Neutrophils Relative %: 63 %
Platelets: 238 10*3/uL (ref 150–400)
RBC: 3.39 MIL/uL — ABNORMAL LOW (ref 3.87–5.11)
RDW: 15.7 % — ABNORMAL HIGH (ref 11.5–15.5)
WBC: 5.8 10*3/uL (ref 4.0–10.5)
nRBC: 0.3 % — ABNORMAL HIGH (ref 0.0–0.2)

## 2021-09-10 LAB — GLUCOSE, CAPILLARY
Glucose-Capillary: 130 mg/dL — ABNORMAL HIGH (ref 70–99)
Glucose-Capillary: 94 mg/dL (ref 70–99)
Glucose-Capillary: 124 mg/dL — ABNORMAL HIGH (ref 70–99)
Glucose-Capillary: 170 mg/dL — ABNORMAL HIGH (ref 70–99)

## 2021-09-10 MED ORDER — ENOXAPARIN SODIUM 80 MG/0.8ML IJ SOSY
80.0000 mg | PREFILLED_SYRINGE | INTRAMUSCULAR | Status: DC
  Administered 2021-09-10 – 2021-09-12 (×3): 80 mg via SUBCUTANEOUS
  Filled 2021-09-10 (×3): qty 0.8

## 2021-09-10 MED ORDER — FUROSEMIDE 40 MG PO TABS
40.0000 mg | ORAL_TABLET | Freq: Every day | ORAL | Status: DC
  Administered 2021-09-10 – 2021-09-13 (×4): 40 mg via ORAL
  Filled 2021-09-10 (×4): qty 1

## 2021-09-10 NOTE — Plan of Care (Signed)
  Problem: Nutritional: Goal: Maintenance of adequate nutrition will improve Outcome: Progressing Goal: Progress toward achieving an optimal weight will improve Outcome: Progressing   Problem: Tissue Perfusion: Goal: Adequacy of tissue perfusion will improve Outcome: Progressing   Problem: Skin Integrity: Goal: Risk for impaired skin integrity will decrease Outcome: Progressing

## 2021-09-10 NOTE — TOC Initial Note (Signed)
Transition of Care (TOC) - Initial/Assessment Note    Patient Details  Name: [name] MRN: [id number] Date of Birth: 03/01/1955  Transition of Care (TOC) CM/SW Contact:     M , LCSWA Phone Number: 09/10/2021, 3:07 PM  Clinical Narrative:                 CSW received SNF consult. CSW met with pt at bedside. CSW introduced self and explained role at the hospital. Pt reports that PTA the lived at home alone. PT reports pt is mod/maxA, pt uses a RW during session and uses a wheelchair regularly   CSW reviewed PT/OT recommendations for SNF. Pt reports she was unsure about going to a SNF bc she had two bad experiences but she would like to go to [facility]. Pt gave CSW permission to fax out to [facility].  CSW contacted [facility], they are reviewing pt information for offer. CSW will continue to follow.  CSW will continue to follow.    Expected Discharge Plan: Skilled Nursing Facility Barriers to Discharge: Continued Medical Work up   Patient Goals and CMS Choice Patient states their goals for this hospitalization and ongoing recovery are:: Rehab CMS Medicare.gov Compare Post Acute Care list provided to:: Patient Choice offered to / list presented to : Patient  Expected Discharge Plan and Services Expected Discharge Plan: Skilled Nursing Facility   Discharge Planning Services: CM Consult Post Acute Care Choice: Skilled Nursing Facility Living arrangements for the past 2 months: Single Family Home                           HH Arranged: PT, OT, Refused SNF          Prior Living Arrangements/Services Living arrangements for the past 2 months: Single Family Home Lives with:: Self Patient language and need for interpreter reviewed:: Yes Do you feel safe going back to the place where you live?: Yes      Need for Family Participation in Patient Care: Yes (Comment) Care giver support system in place?: Yes (comment) Current home services: DME,  Homehealth aide (wheelchair, PCS services 3 hours a day) Criminal Activity/Legal Involvement Pertinent to Current Situation/Hospitalization: No - Comment as needed  Activities of Daily Living      Permission Sought/Granted Permission sought to share information with : Facility Contact Representative, Family Supports Permission granted to share information with : Yes, Verbal Permission Granted  Share Information with NAME: Mann,Darnell (Son)   [phone]  Permission granted to share info w AGENCY: SNF  Permission granted to share info w Relationship: Mann,Darnell (Son)   [phone]  Permission granted to share info w Contact Information: Mann,Darnell (Son)   [phone]  Emotional Assessment Appearance:: Appears stated age Attitude/Demeanor/Rapport: Gracious Affect (typically observed): Accepting, Appropriate Orientation: : Oriented to Self, Oriented to Place, Oriented to  Time, Oriented to Situation Alcohol / Substance Use: Not Applicable Psych Involvement: No (comment)  Admission diagnosis:  Hypokalemia [E87.6] Weakness [R53.1] Acute kidney injury (nontraumatic) (HCC) [N17.9] Generalized weakness [R53.1] Normochromic normocytic anemia [D64.9] Multiple falls [R29.6] Lung nodule seen on imaging study [R91.1] CHF exacerbation (HCC) [I50.9] Patient Active Problem List   Diagnosis Date Noted   CHF exacerbation (HCC) 09/02/2021   Generalized weakness 08/31/2021   Ambulatory dysfunction 08/31/2021   Hypoxia 08/31/2021   AMS (altered mental status) 09/29/2020   Hyperparathyroidism, secondary (HCC) 03/23/2018   Memory difficulty 12/22/2017   Spinal stenosis 06/09/2017   HNP (herniated nucleus pulposus),   lumbar 12/05/2016   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   Diabetic neuropathy (HCC) 09/08/2016   Hypocalcemia 08/22/2016   Abnormal barium swallow 07/07/2016   Anxiety 07/07/2016   History of malignant neoplasm of breast  07/07/2016   Gait abnormality 05/29/2016   Encephalopathy acute 04/23/2016   Hyponatremia 04/23/2016   Schizophrenia, paranoid (HCC)    Aspiration pneumonia of left lower lobe due to gastric secretions (HCC)    Projectile vomiting with nausea    Altered mental status    Schizophrenia (HCC)    Disorganized schizophrenia (HCC)    Chronic pain syndrome    Acute renal failure (HCC)    Controlled diabetes mellitus type 2 with complications (HCC)    Acute encephalopathy    Oropharyngeal dysphagia    Aspiration pneumonia of right lower lobe due to vomit (HCC)    Acute renal failure (ARF) (HCC) 03/28/2016   Encephalopathy 03/27/2016   HCAP (healthcare-associated pneumonia) 02/29/2016   Pneumonia 02/14/2016   CAP (community acquired pneumonia) 02/13/2016   Edema 02/13/2016   AKI (acute kidney injury) (HCC) 02/13/2016   Elevated troponin 06/24/2015   Diarrhea 06/23/2015   Sepsis (HCC) 06/23/2015   Obesity, morbid, BMI 50 or higher (HCC) 03/26/2015   Post traumatic stress disorder (PTSD) 11/14/2014   Leukocytosis 11/12/2014   Slurred speech 11/12/2014   CKD (chronic kidney disease) stage 3, GFR 30-59 ml/min (HCC)    Diabetes mellitus, type II, insulin dependent (HCC)    TIA (transient ischemic attack)    Hypotension 10/28/2014   Syncope 10/28/2014   Anemia 10/28/2014   Type II diabetes mellitus (HCC) 10/28/2014   OSA on CPAP 10/28/2014   Chronic lower back pain 10/28/2014   Polyneuropathy in diabetes(357.2) 10/28/2014   Diabetic polyneuropathy (HCC)    Fall 08/14/2014   Recurrent falls 08/14/2014   Hot flashes 02/24/2014   Arthralgia 02/24/2014   Hair loss 02/24/2014   Chest pain 01/06/2014   Shock (HCC) 08/08/2013   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   Dysarthria 12/30/2012   Benign paroxysmal positional vertigo 12/30/2012   Breast cancer of upper-outer quadrant of right female breast (HCC) 11/18/2012   Hemorrhage of rectum and anus 09/28/2012   Hx of radiation therapy     Syncope and collapse 06/04/2011   HSV 10/05/2008   Mild persistent asthma 06/02/2008   Dyspnea 07/19/2007   HLD (hyperlipidemia) 06/02/2006   Sinus tachycardia 05/25/2006   Hypothyroidism 02/27/2006   Depression 02/27/2006   Essential hypertension 02/27/2006   GERD 02/27/2006   KNEE PAIN 02/27/2006   PCP:  McCune, Haley, PA Pharmacy:   [facility] - [place], [place] - [id number] [place] [address] Phone: [phone] Fax: [phone]     Social Determinants of Health (SDOH) Interventions    Readmission Risk Interventions     No data to display           

## 2021-09-10 NOTE — Progress Notes (Signed)
Pt refused CPAP for bed. RT will cont to monitor as needed.

## 2021-09-10 NOTE — Progress Notes (Signed)
Progress Note   Patient: [name] [id number] DOB: 1955-04-01 DOA: 08/31/2021     8 DOS: the patient was seen and examined on 09/10/2021   Brief hospital course: 66 y.o. female with medical history significant of remote breast CA; morbid obesity;  chronic back pain; stage 3 CKD; HTN; HLD; hypothyroidism; OSA on CPAP; DM; and schizophrenia presenting with falls.  She reports that she has a right prior knee surgery that has failed and cannot be surgically repaired as well as a right foot ligamentous injury that cannot be repaired.  Her right leg has been giving out when she tries to walk from bathroom to chair - she is consistently one step short before her leg gives out.  EMS has been out to help her up multiple times in the last few weeks and brought her to the ER. On further questioning, pt reports her primary complaint had been increasing LE swelling and weakness. Pt at baseline mobilizes via electric wheelchair and ambulates only to get to the bathroom.   Assessment and Plan:  Ambulatory dysfunction Patient with reported joint dysfunction of R knee and ligamentous injury of R foot - both creating gait instability PT/OT consulted with recs for SNF   Hypoxia likely secondary to pulm HTN/obesity hypoventilation Pt is O2 naive, wean O2 if able Did not tolerate hospital provided cpap Initial chest imaging with concerns of possible bronchitis, however no significant symptoms F/u CT chest with findings of cardiomegaly, gross enlargement of main pulm arter consistent with pulm HTN Mild to mod AS on 2d echo with normal LVEF Switch to home Lasix dose given rising creatinine, s/p IV lasix   Chronic pain Continue Belbuca, tramadol, Lyrica, cyclobenzaprine (qhs)/methocarbamol (TID); will not add additional opiates at this time   Schizophrenia Continue home mood meds - doxepin, Ativan, Effexor, Geodon    HTN Hold amlodipine, hyzaar (consider discontinuing one as BP has been very stable  off both BP meds) BP stable and controlled   HLD Continue Crestor   Hypothyroidism Continue Synthroid   OSA Continue CPAP, unable to tolerate   DM Recent A1c was 9.6, poor control Continue NPH, SSI, hypoglycemic protocol hold Trulicity while in hospital   Lung nodule ruled out Initially seen on CXR Reviewed f/u CT chest. Bandlike scarring or atelectasis noted with no evidence of mass   Stage 3b CKD LE edema noted at presentation Monitor BMP closely  Anemia of chronic kidney disease Hemoglobin around baseline, but drop from about 5 months ago Daily CBC, monitor closely  Morbid obesity Body mass index is 53.25 kg/m.[name]  Lifestyle modification advised Outpatient PCP/bariatric medicine f/u encouraged        Subjective:  Patient denies any new complaints.  Wants to go home.    Physical Exam: Vitals:   09/09/21 1700 09/09/21 1937 09/10/21 0502 09/10/21 0817  BP: 108/61 (!) 106/50 130/73 124/66  Pulse: 64 86 84 86  Resp: _0 Temp: 97.8 F (36.6 C) 98.8 F (37.1 C) 98.6 F (37 C) 98.3 F (36.8 C)  TempSrc: Oral Oral Oral   SpO2: 100% 94% 96% 97%  Weight:      Height:       General: NAD, obese Cardiovascular: S1, S2 present Respiratory: CTAB Abdomen: Soft, nontender, nondistended, bowel sounds present Musculoskeletal: Noted bilateral pedal edema noted, chronic venous stasis changes on BLE Skin: As mentioned above Psychiatry: Normal mood     Family Communication: None at bedside  Disposition: Status is: Inpatient Continue inpatient stay  because: Severity of illness  Planned Discharge Destination: Skilled nursing facility  Author: Alma Friendly, MD 09/10/2021 3:03 PM  For on call review [email or web address].

## 2021-09-11 DIAGNOSIS — R262 Difficulty in walking, not elsewhere classified: Secondary | ICD-10-CM | POA: Diagnosis not present

## 2021-09-11 LAB — CBC WITH DIFFERENTIAL/PLATELET
Abs Immature Granulocytes: 0.13 10*3/uL — ABNORMAL HIGH (ref 0.00–0.07)
Basophils Absolute: 0.1 10*3/uL (ref 0.0–0.1)
Basophils Relative: 1 %
Eosinophils Absolute: 0.4 10*3/uL (ref 0.0–0.5)
Eosinophils Relative: 5 %
HCT: 31.7 % — ABNORMAL LOW (ref 36.0–46.0)
Hemoglobin: 10 g/dL — ABNORMAL LOW (ref 12.0–15.0)
Immature Granulocytes: 2 %
Lymphocytes Relative: 23 %
Lymphs Abs: 1.7 10*3/uL (ref 0.7–4.0)
MCH: 28.1 pg (ref 26.0–34.0)
MCHC: 31.5 g/dL (ref 30.0–36.0)
MCV: 89 fL (ref 80.0–100.0)
Monocytes Absolute: 0.5 10*3/uL (ref 0.1–1.0)
Monocytes Relative: 7 %
Neutro Abs: 4.5 10*3/uL (ref 1.7–7.7)
Neutrophils Relative %: 62 %
Platelets: 288 10*3/uL (ref 150–400)
RBC: 3.56 MIL/uL — ABNORMAL LOW (ref 3.87–5.11)
RDW: 15.6 % — ABNORMAL HIGH (ref 11.5–15.5)
WBC: 7.2 10*3/uL (ref 4.0–10.5)
nRBC: 0 % (ref 0.0–0.2)

## 2021-09-11 LAB — BASIC METABOLIC PANEL
Anion gap: 12 (ref 5–15)
BUN: 27 mg/dL — ABNORMAL HIGH (ref 8–23)
CO2: 24 mmol/L (ref 22–32)
Calcium: 9.4 mg/dL (ref 8.9–10.3)
Chloride: 103 mmol/L (ref 98–111)
Creatinine, Ser: 1.38 mg/dL — ABNORMAL HIGH (ref 0.44–1.00)
GFR, Estimated: 42 mL/min — ABNORMAL LOW (ref >60)
Glucose, Bld: 175 mg/dL — ABNORMAL HIGH (ref 70–99)
Potassium: 4.1 mmol/L (ref 3.5–5.1)
Sodium: 139 mmol/L (ref 135–145)

## 2021-09-11 LAB — GLUCOSE, CAPILLARY
Glucose-Capillary: 168 mg/dL — ABNORMAL HIGH (ref 70–99)
Glucose-Capillary: 117 mg/dL — ABNORMAL HIGH (ref 70–99)
Glucose-Capillary: 151 mg/dL — ABNORMAL HIGH (ref 70–99)
Glucose-Capillary: 123 mg/dL — ABNORMAL HIGH (ref 70–99)
Glucose-Capillary: 115 mg/dL — ABNORMAL HIGH (ref 70–99)

## 2021-09-11 NOTE — Assessment & Plan Note (Signed)
Glucose normal - Continue SS corrections - Continue NPH

## 2021-09-11 NOTE — Assessment & Plan Note (Signed)
-  Consult PT and OT and TOC

## 2021-09-11 NOTE — Progress Notes (Signed)
RN went to round on the patient and she stated that she was having sharp lower left sided pain that had been going on for 2 days now. RN asked patient if she told the MD this AM when he rounded and she stated that it was not hurting then. RN messaged Dr. Loleta Books and notified him about the patients pain and he stated to get vitals and give her some pain medications to see if that would help if anything changes to let him know. VS taken and were within normal limits, they are in the flowsheets. Pt given pain medication.

## 2021-09-11 NOTE — TOC Transition Note (Signed)
Transition of Care [facility]) - CM/SW Discharge Note   Patient Details  Name: [name] MRN: [id number] Date of Birth: [birth year]  [facility] CM/SW Contact:  Carles Collet, RN Phone Number: 09/11/2021, 4:29 PM   Clinical Narrative:    Spoke w patient at bedside earlier this afternoon and she was agreeable to SNF, however per CSW she refused based on her facility options.  Discussed with MD and TOC leadership, who reviewed chart and advised since patient is alert and oriented that she can decide to DC to home with [facility].  HH- [facility] accepted for [facility] services. Requested for MD to place [facility] PT OT orders.  DME- needs oxygen sats documented, I have requested this from the nurse. [employer] also needs notified to deliver O2 to the house Transportation Patient will need transportation by [facility]AR once O2 is set up at her house      Final next level of care: [place] Barriers to Discharge: No Barriers Identified   Patient Goals and CMS Choice Patient states their goals for this hospitalization and ongoing recovery are:: Rehab CMS Medicare.gov Compare Post Acute Care list provided to:: Patient Choice offered to / list presented to : Patient  Discharge Placement                       Discharge Plan and Services   Discharge Planning Services: CM Consult Post Acute Care Choice: [place]          DME Arranged: N/A         HH Arranged: PT, OT [place] Agency: Seth Ward Date [place]: 09/11/21 Time [place]: 1628 Representative spoke with at [place]: Malachy Mood  Social Determinants of Health ([place]) Interventions     Readmission Risk Interventions     No data to display

## 2021-09-11 NOTE — Progress Notes (Signed)
Occupational Therapy Treatment Patient Details Name: [name] MRN: [id number] DOB: 1955-08-30 Today's Date: 09/11/2021   History of present illness 66 y/o female admitted secondary to multiple falls. Workup pending. PMH includes Breast cancer, HSV  infection, Memory difficulty, OSA on CPAP,  Schizophrenia, Secondary parkinsonism, and Type II diabetes mellitus.   OT comments  Pt able to demo improved functional transfers today with Min A x 2 for pivot to recliner (simulating baseline w/c transfers at home). Pt remains limited by B knee pain and R ankle instability w/ hx of falls. Discussed barriers with bathroom not w/c accessible at home and likely need to [facility] use if pt to DC home. Will need to demonstrate consistent limited assist transfers in order to DC home rather than SNF.    Recommendations for follow up therapy are one component of a multi-disciplinary discharge planning process, led by the attending physician.  Recommendations may be updated based on patient status, additional functional criteria and insurance authorization.    Follow Up Recommendations  Skilled nursing-short term rehab (<3 hours/day)    Assistance Recommended at Discharge Intermittent Supervision/Assistance  Patient can return home with the following  A lot of help with walking and/or transfers;A lot of help with bathing/dressing/bathroom   Equipment Recommendations  BSC/3in1 (bariatric BSC)    Recommendations for Other Services      Precautions / Restrictions Precautions Precautions: Fall Precaution Comments: Unable to recall how many falls she has had within the last month. 4 within a day Restrictions Weight Bearing Restrictions: No       Mobility Bed Mobility Overal bed mobility: Needs Assistance Bed Mobility: Supine to Sit     Supine to sit: Min assist, HOB elevated     General bed mobility comments: light assist to scoot/lift trunk. pt moving LEs to EOB well    Transfers Overall  transfer level: Needs assistance Equipment used: None Transfers: Bed to chair/wheelchair/BSC   Stand pivot transfers: Min assist, +2 physical assistance, +2 safety/equipment         General transfer comment: Guided pt in transfer to recliner towards L side based on w/c setup at home. Pt able to clear bottom and hold to recliner armrest without assist. Min A x 2 for guiding hips to chair successfully and cues for sequencing hand placement on armrests. Pt able to effectively scoot self back in chair by clearing bottom - Min A to fully scoot back in chair     Balance Overall balance assessment: Needs assistance Sitting-balance support: Feet supported, No upper extremity supported Sitting balance-Leahy Scale: Good     Standing balance support: During functional activity Standing balance-Leahy Scale: Poor Standing balance comment: reliant on UE support                           ADL either performed or assessed with clinical judgement   ADL Overall ADL's : Needs assistance/impaired                                       General ADL Comments: Emphasis on functional transfers, simulating home environment and discussing LB ADL routine and task modification. Recommend BSC use at home d/t bathroom not w/c accessible. Pt reports plan to talk to handyman about widening doorway, also educated on special hinges that allow increased doorway width    Extremity/Trunk Assessment Upper Extremity Assessment Upper Extremity Assessment:  LUE deficits/detail LUE Deficits / Details: L UE discomfort (primarily shoulder), heavy UE use on bedrail with some difficulty reaching   Lower Extremity Assessment Lower Extremity Assessment: Defer to PT evaluation        Vision   Vision Assessment?: No apparent visual deficits   Perception     Praxis      Cognition Arousal/Alertness: Awake/alert Behavior During Therapy: Flat affect Overall Cognitive Status: No family/caregiver  present to determine baseline cognitive functioning                                 General Comments: pleasant, participatory. benefits from cues to sequence and encouragement        Exercises      Shoulder Instructions       General Comments 2 L O2 throughout session    Pertinent Vitals/ Pain       Pain Assessment Pain Assessment: Faces Faces Pain Scale: Hurts even more Pain Location: bil knees L shoulder, back Pain Descriptors / Indicators: Discomfort, Grimacing Pain Intervention(s): Monitored during session  Home Living                                          Prior Functioning/Environment              Frequency  Min 2X/week        Progress Toward Goals  OT Goals(current goals can now be found in the care plan section)  Progress towards OT goals: Progressing toward goals  Acute Rehab OT Goals Patient Stated Goal: wants to be able to go home, get doorways widened so w/c will fit in bathroom OT Goal Formulation: With patient Time For Goal Achievement: 09/15/21 Potential to Achieve Goals: Good ADL Goals Pt Will Transfer to Toilet: with mod assist;squat pivot transfer;bedside commode Additional ADL Goal #1: pt will complete bed mobility supervision as precursor to adls. Additional ADL Goal #2: pt will complete sit<>Stand for 1 minute as precursor to stand pivot to w/c  Plan Discharge plan remains appropriate    Co-evaluation                 AM-PAC OT "6 Clicks" Daily Activity     Outcome Measure   Help from another person eating meals?: None Help from another person taking care of personal grooming?: A Little Help from another person toileting, which includes using toliet, bedpan, or urinal?: A Lot Help from another person bathing (including washing, rinsing, drying)?: A Lot Help from another person to put on and taking off regular upper body clothing?: A Little Help from another person to put on and taking off  regular lower body clothing?: A Lot 6 Click Score: 16    End of Session Equipment Utilized During Treatment: Oxygen;Gait belt  OT Visit Diagnosis: Unsteadiness on feet (R26.81);Muscle weakness (generalized) (M62.81);Pain   Activity Tolerance Patient tolerated treatment well   Patient Left in chair;with call bell/phone within reach;with chair alarm set;with nursing/sitter in room   Nurse Communication Mobility status        Time: 2947-6546 OT Time Calculation (min): 32 min  Charges: OT General Charges $OT Visit: 1 Visit OT Treatments $Therapeutic Activity: 23-37 mins  Malachy Chamber, OTR/L Acute Rehab Services Office: [phone]   Layla Maw 09/11/2021, 12:13 PM

## 2021-09-11 NOTE — Discharge Summary (Signed)
Physician Discharge Summary   Patient: [name] MRN: [id number] DOB: 1956-01-01  Admit date:     08/31/2021  Discharge date: 09/11/21  Discharge Physician: Edwin Dada   PCP: Fredrich Romans, PA     Recommendations at discharge:  Follow up with PCP Fredrich Romans in 1 week Ms Georjean Mode: Please refer to Pulmonology for obesity hypoventilation syndrome and Pulmonary hypertension     Discharge Diagnoses: Principal Problem:   Ambulatory dysfunction Active Problems:   Chronic respiratory failure with hypoxia (HCC)   Hypothyroidism   HLD (hyperlipidemia)   Essential hypertension   Recurrent falls   Anemia due to chronic kidney disease   Type II diabetes mellitus (HCC)   OSA on CPAP   Stage 3b chronic kidney disease (CKD) (HCC)   Obesity, morbid, BMI 53   Chronic pain syndrome   Schizophrenia [facility])      Hospital Course: [name] is a 66 y.o. F with morbid obesity, chronic pain on daily opiates, CKD IIIb, hypothyroidism, dCHF, HTN and OSA who presented with falls.    Reported that her right leg kept giving out while walking, due to a failed right knee surgery and right foot injury.     Admitted and imaging of the right leg was unremarkable.  She was evaluated by PT who recommended SNF, which the patient declined.    She was noted to be hypoxic, but had normal CT chest.  Given leg swelling and hypoxia, she was treated with IV Lasix and her creatinine increased, so she was put back on her home Lasix.  She had echo that showed pulmonary hypertension, likely from OSA and OHS.  She was discharged on O2 and should have Pulmonary follow up.           The [facility] Controlled Substances Registry was not able to be reviewed for this patient prior to discharge.   Disposition: Home health   DISCHARGE MEDICATION: Allergies as of 09/11/2021       Reactions   Bee Venom Anaphylaxis   Invokana [canagliflozin] Anaphylaxis   Dyspnea and urinary retention    Shellfish Allergy Anaphylaxis   Buprenorphine Hcl Other (See Comments)   Overly sedated with morphine drip.   Other Other (See Comments)   Cats - bad asthma, stopped up        Medication List     STOP taking these medications    amLODipine 10 MG tablet Commonly known as: NORVASC   cyclobenzaprine 10 MG tablet Commonly known as: FLEXERIL   doxepin 50 MG capsule Commonly known as: SINEQUAN   LORazepam 0.5 MG tablet Commonly known as: ATIVAN   LORazepam 1 MG tablet Commonly known as: ATIVAN   losartan-hydrochlorothiazide 100-25 MG tablet Commonly known as: HYZAAR   methocarbamol 750 MG tablet Commonly known as: ROBAXIN       TAKE these medications    acetaminophen 650 MG CR tablet Commonly known as: TYLENOL Take 1,300 mg by mouth every 8 (eight) hours as needed for pain.   albuterol 108 (90 Base) MCG/ACT inhaler Commonly known as: VENTOLIN HFA Inhale 2 puffs into the lungs every 4 (four) hours as needed for wheezing or shortness of breath.   aspirin EC 81 MG tablet Take 1 tablet (81 mg total) by mouth daily.   Belbuca 300 MCG Film Generic drug: Buprenorphine HCl Take 300 mcg by mouth 2 (two) times daily.   belladonna-opium 16.2-30 MG suppository Commonly known as: B&O SUPPRETTES Place 1 suppository rectally 2 (two) times daily  as needed (for bladder spasms).   calcitRIOL 0.25 MCG capsule Commonly known as: ROCALTROL TAKE ONE CAPSULE BY MOUTH three times PER WEEK   esomeprazole 40 MG capsule Commonly known as: NEXIUM Take 1 capsule (40 mg total) by mouth daily before breakfast.   famotidine 40 MG tablet Commonly known as: PEPCID Take 40 mg by mouth daily after supper.   ferrous sulfate 325 (65 FE) MG tablet Take 325 mg by mouth in the morning.   furosemide 20 MG tablet Commonly known as: LASIX Take 2 tablets (40 mg total) by mouth daily. What changed:  how much to take when to take this   HumuLIN N KwikPen 100 UNIT/ML Kiwkpen Generic  drug: Insulin NPH (Human) (Isophane) Inject 55 Units into the skin every morning. And pen needles 1/day   levothyroxine 200 MCG tablet Commonly known as: SYNTHROID Take 1 tablet (200 mcg total) by mouth daily before breakfast.   linaclotide 290 MCG Caps capsule Commonly known as: LINZESS Take 290 mcg by mouth daily before breakfast.   lubiprostone 24 MCG capsule Commonly known as: AMITIZA Take 24 mcg by mouth 2 (two) times daily.   mirabegron ER 25 MG Tb24 tablet Commonly known as: MYRBETRIQ Take 25 mg by mouth in the morning.   naloxone 4 MG/0.1ML Liqd nasal spray kit Commonly known as: NARCAN Place 1 spray into the nose as needed for opioid reversal.   OneTouch Delica Plus [id number] Misc 1 each by Other route 3 (three) times daily. Use to check blood sugar 3 times a day   OneTouch Verio test strip Generic drug: glucose blood TEST BLOOD SUGAR THREE TIMES DAILY   polyvinyl alcohol 1.4 % ophthalmic solution Commonly known as: LIQUIFILM TEARS 1 drop as needed for dry eyes.   potassium chloride SA 20 MEQ tablet Commonly known as: KLOR-CON M Take 20 mEq by mouth daily.   pregabalin 300 MG capsule Commonly known as: LYRICA Take 300 mg by mouth 2 (two) times daily.   rosuvastatin 20 MG tablet Commonly known as: CRESTOR Take 20 mg by mouth daily.   sucralfate 1 g tablet Commonly known as: CARAFATE Take 1 g by mouth in the morning.   Sure Comfort Pen Needles 32G X 6 MM Misc Generic drug: Insulin Pen Needle USE TO INJECT INSULIN FOUR TIMES DAILY   tolterodine 2 MG 24 hr capsule Commonly known as: DETROL LA Take 2 mg by mouth every evening.   traMADol 50 MG tablet Commonly known as: Ultram Take 1 tablet (50 mg total) by mouth every 6 (six) hours as needed.   Trulicity 4.5 ZL/9.3TT Sopn Generic drug: Dulaglutide Inject 4.5 mg into the skin once a week. What changed: when to take this   valACYclovir 1000 MG tablet Commonly known as: VALTREX Take 1,000 mg by  mouth daily.   venlafaxine XR 150 MG 24 hr capsule Commonly known as: EFFEXOR-XR Take 150 mg by mouth 2 (two) times daily.   VITAMIN B-12 PO Take 1 tablet by mouth daily.   VITAMIN C PO Take 1 tablet by mouth in the morning.   Vitamin D (Ergocalciferol) 1.25 MG (50000 UNIT) Caps capsule Commonly known as: DRISDOL Take 1 capsule (50,000 Units total) by mouth every 14 (fourteen) days.   Vitamin D-3 25 MCG (1000 UT) Caps Take 1,000 Units by mouth daily.   ziprasidone 60 MG capsule Commonly known as: GEODON Take 60 mg by mouth daily after supper.  Durable Medical Equipment  (From admission, onward)           Start     Ordered   09/01/21 1350  For home use only DME oxygen  Once       Question Answer Comment  Length of Need Lifetime   Mode or (Route) Nasal cannula   Liters per Minute 3   Frequency Continuous (stationary and portable oxygen unit needed)   Oxygen conserving device Yes   Oxygen delivery system Gas      09/01/21 1350   09/01/21 1252  For home use only DME 3 n 1  Once       Comments: Need Bariatric   09/01/21 1251            Follow-up Information     Fredrich Romans, [place]. Schedule an appointment as soon as possible for a visit in 1 week(s).   Specialty: Physician Assistant Contact information: [place] [address] [phone]         Care, [facility] Follow up.   Why: for home health services Contact information: [place] [address] [phone]                 Discharge Instructions     Discharge instructions   Complete by: As directed    Go see your primary care doctor in 1 week   Increase activity slowly   Complete by: As directed        Discharge Exam: Filed Weights   09/06/21 0410 09/09/21 0500 09/11/21 0630  Weight: (!) 170.6 kg (!) 161.1 kg (!) 158.7 kg    General: Pt is alert, awake, not in acute distress, sitting up in recliner eating  lunch, morbidly obese Cardiovascular: RRR, nl S1-S2, no murmurs appreciated.   No LE edema.   Respiratory: Normal respiratory rate and rhythm.  CTAB without rales or wheezes. Lung sounds diminished throughout due to body habitus Neuro/Psych: Strength symmetric in upper and lower extremities.  Judgment and insight appear normal.   Condition at discharge: stable  The results of significant diagnostics from this hospitalization (including imaging, microbiology, ancillary and laboratory) are listed below for reference.   Imaging Studies: DG Chest Port 1 View  Result Date: 09/06/2021 CLINICAL DATA:  [id number] EXAM: PORTABLE CHEST 1 VIEW COMPARISON:  August 31, 2021 FINDINGS: Mild-to-moderate cardiomegaly, stable. There is pulmonary vascular congestion seen and slightly more prominent in the present study. Low lung volume. Previously seen opacity at the left midlung has resolved. Likely minor left basilar atelectasis. Stable surgical clips at the right thyroid region. The visualized skeletal structures are unremarkable. IMPRESSION: Mild-to-moderate cardiomegaly, stable. Mild pulmonary vascular congestion which appears slightly more prominent in the present study. There has been interval resolution of the opacity at the left mid lung. Electronically Signed   By: Frazier Richards M.D.   On: 09/06/2021 09:50   ECHOCARDIOGRAM COMPLETE  Result Date: 09/02/2021    ECHOCARDIOGRAM REPORT   Patient Name:   TOMMYE LEHENBAUER Date of Exam: 09/02/2021 Medical Rec #:  [id number]        Height:       65.0 in Accession #:    [id number]       Weight:       320.0 lb Date of Birth:  1956/01/05        BSA:          2.415 m Patient Age:    66 years  BP:           121/74 mmHg Patient Gender: F                HR:           91 bpm. Exam Location:  Inpatient Procedure: 2D Echo, Cardiac Doppler, Color Doppler and Intracardiac            Opacification Agent Indications:    CHF- acute diastolic  History:        Patient has prior  history of Echocardiogram examinations, most                 recent 03/30/2016. Signs/Symptoms:Edema; Risk                 Factors:Hypertension, Diabetes and HLD. Remote breast CA ,                 hypothyroidism.  Sonographer:    Joette Catching RCS Referring Phys: [phone] STEPHEN K CHIU  Sonographer Comments: Patient is morbidly obese. IMPRESSIONS  1. Left ventricular ejection fraction, by estimation, is 65 to 70%. The left ventricle has normal function. The left ventricle has no regional wall motion abnormalities. Left ventricular diastolic parameters are indeterminate.  2. Right ventricular systolic function is normal. The right ventricular size is normal. There is normal pulmonary artery systolic pressure.  3. The mitral valve is grossly normal. Trivial mitral valve regurgitation. No evidence of mitral stenosis.  4. The aortic valve was not well visualized. Aortic valve regurgitation is not visualized. Mild to moderate aortic valve stenosis.  5. The inferior vena cava is dilated in size with <50% respiratory variability, suggesting right atrial pressure of 15 mmHg. Comparison(s): No significant change from prior study. Conclusion(s)/Recommendation(s): Aortic valve with moderate stenosis by gradients, but valve not well visualized-cannot determine mobility/morphology. FINDINGS  Left Ventricle: Left ventricular ejection fraction, by estimation, is 65 to 70%. The left ventricle has normal function. The left ventricle has no regional wall motion abnormalities. Definity contrast agent was given IV to delineate the left ventricular  endocardial borders. The left ventricular internal cavity size was normal in size. Suboptimal image quality limits for assessment of left ventricular hypertrophy. Left ventricular diastolic parameters are indeterminate. Right Ventricle: The right ventricular size is normal. Right vetricular wall thickness was not well visualized. Right ventricular systolic function is normal. There is normal  pulmonary artery systolic pressure. The tricuspid regurgitant velocity is 2.02 m/s, and with an assumed right atrial pressure of 15 mmHg, the estimated right ventricular systolic pressure is 26.9 mmHg. Left Atrium: Left atrial size was normal in size. Right Atrium: Right atrial size was not well visualized. Pericardium: There is no evidence of pericardial effusion. Presence of epicardial fat layer. Mitral Valve: The mitral valve is grossly normal. Trivial mitral valve regurgitation. No evidence of mitral valve stenosis. Tricuspid Valve: The tricuspid valve is not well visualized. Tricuspid valve regurgitation is trivial. No evidence of tricuspid stenosis. Aortic Valve: The aortic valve was not well visualized. Aortic valve regurgitation is not visualized. Mild to moderate aortic stenosis is present. Aortic valve mean gradient measures 23.0 mmHg. Aortic valve peak gradient measures 40.4 mmHg. Aortic valve area, by VTI measures 1.07 cm. Pulmonic Valve: The pulmonic valve was not well visualized. Pulmonic valve regurgitation is not visualized. Aorta: The aortic root, ascending aorta and aortic arch are all structurally normal, with no evidence of dilitation or obstruction. Venous: The inferior vena cava is dilated in size with less than 50% respiratory variability,  suggesting right atrial pressure of 15 mmHg. IAS/Shunts: The interatrial septum was not well visualized.  LEFT VENTRICLE PLAX 2D LVOT diam:     2.00 cm     Diastology LV SV:         54          LV e' medial:    7.07 cm/s LV SV Index:   22          LV E/e' medial:  16.5 LVOT Area:     3.14 cm    LV e' lateral:   9.36 cm/s                            LV E/e' lateral: 12.5  LV Volumes (MOD) LV vol d, MOD A2C: 79.1 ml LV vol d, MOD A4C: 83.2 ml LV vol s, MOD A2C: 19.9 ml LV vol s, MOD A4C: 40.8 ml LV SV MOD A2C:     59.2 ml LV SV MOD A4C:     83.2 ml LV SV MOD BP:      53.9 ml RIGHT VENTRICLE             IVC RV S prime:     12.50 cm/s  IVC diam: 2.10 cm TAPSE  (M-mode): 2.4 cm LEFT ATRIUM             Index LA diam:        3.90 cm 1.61 cm/m LA Vol (A2C):   34.6 ml 14.32 ml/m LA Vol (A4C):   30.6 ml 12.67 ml/m LA Biplane Vol: 34.4 ml 14.24 ml/m  AORTIC VALVE AV Area (Vmax):    1.03 cm AV Area (Vmean):   1.04 cm AV Area (VTI):     1.07 cm AV Vmax:           318.00 cm/s AV Vmean:          198.600 cm/s AV VTI:            0.504 m AV Peak Grad:      40.4 mmHg AV Mean Grad:      23.0 mmHg LVOT Vmax:         104.00 cm/s LVOT Vmean:        65.700 cm/s LVOT VTI:          0.171 m LVOT/AV VTI ratio: 0.34  AORTA Ao Root diam: 3.10 cm Ao Asc diam:  3.00 cm MITRAL VALVE                TRICUSPID VALVE MV Area (PHT): 3.99 cm     TR Peak grad:   16.3 mmHg MV Decel Time: 190 msec     TR Vmax:        202.00 cm/s MV E velocity: 117.00 cm/s MV A velocity: 121.00 cm/s  SHUNTS MV E/A ratio:  0.97         Systemic VTI:  0.17 m                             Systemic Diam: 2.00 cm Buford Dresser MD Electronically signed by Buford Dresser MD Signature Date/Time: 09/02/2021/7:07:11 PM    Final    CT CHEST WO CONTRAST  Result Date: 09/02/2021 CLINICAL DATA:  Multiple falls, abnormal chest radiograph, concern for lung mass EXAM: CT CHEST WITHOUT CONTRAST TECHNIQUE: Multidetector CT imaging of the chest was performed following the standard protocol without IV contrast.  RADIATION DOSE REDUCTION: This exam was performed according to the departmental dose-optimization program which includes automated exposure control, adjustment of the mA and/or kV according to patient size and/or use of iterative reconstruction technique. COMPARISON:  Chest radiographs, 08/31/2021, CT chest, 09/29/2020 FINDINGS: Cardiovascular: Aortic atherosclerosis. Aortic valve calcifications. Cardiomegaly. Three-vessel coronary artery calcifications. Gross enlargement of the main pulmonary artery measuring up to 4.1 cm in caliber. No pericardial effusion. Mediastinum/Nodes: No enlarged mediastinal, hilar, or  axillary lymph nodes. Status post right lobe thyroidectomy. Trachea, and esophagus demonstrate no significant findings. Lungs/Pleura: Dependent bibasilar atelectasis or consolidation. Bandlike scarring or atelectasis of the bilateral lung bases. No pleural effusion or pneumothorax. Upper Abdomen: No acute abnormality. Musculoskeletal: No chest wall abnormality. No suspicious osseous lesions identified. IMPRESSION: 1. Dependent bibasilar atelectasis or consolidation. Bandlike scarring or atelectasis of the bilateral lung bases. No evidence of pulmonary mass. Radiographic abnormality previously identified may have reflected transient atelectasis or superimposition of scarring. 2. Cardiomegaly and coronary artery disease. 3. Gross enlargement of the main pulmonary artery as can be seen in pulmonary arterial hypertension. 4. Aortic valve calcifications. Correlate for echocardiographic evidence of aortic valve dysfunction. Aortic Atherosclerosis (ICD10-I70.0). Electronically Signed   By: Delanna Ahmadi M.D.   On: 09/02/2021 16:26   DG Knee Complete 4 Views Right  Result Date: 08/31/2021 CLINICAL DATA:  Trauma, pain EXAM: RIGHT KNEE - COMPLETE 4+ VIEW COMPARISON:  03/08/2018 FINDINGS: Examination is technically less than optimal due to patient's body habitus and pain. There is previous right knee arthroplasty. No recent fracture is seen. There is marked edema in subcutaneous plane. Smooth marginated calcifications adjacent to the patella and distal femur may be residual from previous surgery. IMPRESSION: Status post previous right knee arthroplasty. No recent fracture is seen. Electronically Signed   By: Elmer Picker M.D.   On: 08/31/2021 12:47   DG Chest Port 1 View  Result Date: 08/31/2021 CLINICAL DATA:  66 year old female with history of weakness and shortness of breath. EXAM: PORTABLE CHEST 1 VIEW COMPARISON:  Chest x-ray 09/30/2010. FINDINGS: Lung volumes are normal. Patchy areas of interstitial  prominence and peribronchial cuffing noted throughout the mid to lower lung bilaterally, where there also several ill-defined opacities, most notably in the left mid lung where there is a ill-defined nodular density which is slightly more prominent than the prior chest x-ray. No pleural effusions. No pneumothorax. Cephalization of the pulmonary vasculature. Heart size is mildly enlarged. Upper mediastinal contours are within normal limits. Surgical clips project over the lower right cervical region, likely from prior thyroidectomy. IMPRESSION: 1. Increasingly conspicuous nodular density in the left mid lung concerning for potential neoplasm. Further evaluation with nonemergent chest CT is recommended in the near future to better evaluate this finding. 2. Diffuse bronchial wall thickening and peribronchial cuffing, which could indicate an acute bronchitis. 3. Cardiomegaly. Electronically Signed   By: Vinnie Langton M.D.   On: 08/31/2021 05:58    Microbiology: Results for orders placed or performed during the hospital encounter of 03/12/21  Surgical PCR Screen     Status: None   Collection Time: 03/12/21  8:38 AM   Specimen: Nasal Mucosa; Nasal Swab  Result Value Ref Range Status   MRSA, PCR NEGATIVE NEGATIVE Final   Staphylococcus aureus NEGATIVE NEGATIVE Final    Comment: (NOTE) The Xpert SA Assay (FDA approved for NASAL specimens in patients 59 years of age and older), is one component of a comprehensive surveillance program. It is not intended to diagnose infection nor to guide or monitor  treatment. Performed at [facility], [place] [address] [place], [place] [address]    *Note: Due to a large number of results and/or encounters for the requested time period, some results have not been displayed. A complete set of results can be found in Results Review.    Labs: CBC: Recent Labs  Lab 09/07/21 0139 09/08/21 0242 09/09/21 0108 09/10/21 0249 09/11/21 0320  WBC 6.0 5.6 6.6 5.8 7.2   NEUTROABS 3.9 3.4 4.0 3.7 4.5  HGB 8.3* 8.4* 8.7* 9.5* 10.0*  HCT 26.3* 26.5* 27.2* 30.6* 31.7*  MCV 88.9 88.6 87.5 90.3 89.0  PLT 231 256 271 238 852   Basic Metabolic Panel: Recent Labs  Lab 09/07/21 0139 09/08/21 0242 09/09/21 0108 09/10/21 0249 09/11/21 0320  NA 141 142 143 141 139  K 4.0 3.8 4.1 4.4 4.1  CL 102 104 107 104 103  CO2 _0 GLUCOSE 138* 129* 122* 123* 175*  BUN 25* 27* 28* 28* 27*  CREATININE 1.13* 1.18* 1.50* 1.33* 1.38*  CALCIUM 9.0 9.3 9.4 9.4 9.4   Liver Function Tests: No results for input(s): "AST", "ALT", "ALKPHOS", "BILITOT", "PROT", "ALBUMIN" in the last 168 hours. CBG: Recent Labs  Lab 09/10/21 1627 09/10/21 2150 09/11/21 0824 09/11/21 1155 09/11/21 1556  GLUCAP 124* 170* 168* 117* 151*    Discharge time spent: approximately 35 minutes spent on discharge counseling, evaluation of patient on day of discharge, and coordination of discharge planning with nursing, social work, pharmacy and case management  Signed: Edwin Dada, MD [facility] 09/11/2021

## 2021-09-11 NOTE — Progress Notes (Signed)
Mobility Specialist Progress Note:   09/11/21 1400  Mobility  Activity Transferred from chair to bed  Level of Assistance Moderate assist, patient does 50-74% (+2)  Assistive Device None  Distance Ambulated (ft) 2 ft  Activity Response Tolerated well  $Mobility charge 1 Mobility   Pt eager to transfer back to bed. Displayed increased RLE weakness throughout transfer, requiring up to modA+2 to take pivotal steps to bed. Pt left in bed with all needs met.     Acute Rehab Secure Chat or Office Phone: [phone]  

## 2021-09-11 NOTE — Assessment & Plan Note (Signed)
At baseline

## 2021-09-11 NOTE — Progress Notes (Signed)
SATURATION QUALIFICATIONS: (This note is used to comply with regulatory documentation for home oxygen)  Patient Saturations on Room Air at Rest = 88% Patient  Saturations on 2L of oxygen at Rest = 94%  Patient Saturations on Room Air while Ambulating =  Patient Saturations on Liters of oxygen while Ambulating =  Please briefly explain why patient needs home oxygen: pt becomes SOB when at rest but comes back up to 94% with 2L

## 2021-09-11 NOTE — TOC Progression Note (Signed)
Transition of Care [facility]) - Progression Note    Patient Details  Name: [name] MRN: [id number] Date of Birth: 04/14/1955  Transition of [facility]) CM/SW [place], [place] Phone Number: 09/11/2021, 3:48 PM  Clinical Narrative:    CSW spoke with admissions at [facility] do not have any available beds. CSW informed pt that her offers were [facility] and [place]. Pt declined both offers and stated that if these are her only options she will DC home. CSW contacted [facility] they are not able to offer either.   CSW informed pt that [facility] and [facility]  is not able to accept either. Pt refuses [place], [place] and [place]. Pt is requesting to go home and "try" rather than go to her accepted facilities.   Expected Discharge Plan: [name] Barriers to Discharge: Continued Medical Work up  Expected Discharge Plan and Services Expected Discharge Plan: [place]   Discharge Planning Services: CM Consult Post Acute Care Choice: [place] Living arrangements for the past 2 months: Single Family Home                           HH Arranged: PT, OT, Refused SNF           Social Determinants of Health (SDOH) Interventions    Readmission Risk Interventions     No data to display

## 2021-09-11 NOTE — Progress Notes (Signed)
Mobility Specialist Progress Note:   09/11/21 1025  Mobility  Activity  (bed level exercises)  Range of Motion/Exercises Active  Assistive Device None  Activity Response Tolerated well  $Mobility charge 1 Mobility   Pt agreeable to mobility session. Performed multiple bed level exercises. Pt c/o R leg feeling heavy, otherwise tolerated well. Will f/u to return back to bed after OT session.   Nelta Numbers Acute Rehab Secure Chat or Office Phone: [phone]

## 2021-09-12 DIAGNOSIS — R262 Difficulty in walking, not elsewhere classified: Secondary | ICD-10-CM | POA: Diagnosis not present

## 2021-09-12 LAB — GLUCOSE, CAPILLARY
Glucose-Capillary: 167 mg/dL — ABNORMAL HIGH (ref 70–99)
Glucose-Capillary: 102 mg/dL — ABNORMAL HIGH (ref 70–99)
Glucose-Capillary: 136 mg/dL — ABNORMAL HIGH (ref 70–99)
Glucose-Capillary: 136 mg/dL — ABNORMAL HIGH (ref 70–99)

## 2021-09-12 MED ORDER — FAMOTIDINE 20 MG PO TABS
40.0000 mg | ORAL_TABLET | Freq: Two times a day (BID) | ORAL | Status: DC | PRN
  Administered 2021-09-12: 40 mg via ORAL
  Filled 2021-09-12: qty 2

## 2021-09-12 NOTE — Progress Notes (Addendum)
Mobility Specialist Criteria Algorithm Info.   09/12/21 1545  Mobility  Activity Transferred from chair to bed  Range of Motion/Exercises Active;All extremities  Level of Assistance +2 (takes two people)  Assistive Device Other (Comment) (HHA)  Activity Response Tolerated well   Patient received in recliner chair requesting assistance back to bed. Required mod A+2 and cues for hand placement to squat pivot from chair>bed. Upon retuning to bed pt needed mod A+2 for bed mobility. Tolerated without complaint or incident. Was left in supine with all needs met and NT present.  09/12/2021 3:55 PM  [name], [place], [place]  MLPXO:[phone] Office: [phone]

## 2021-09-12 NOTE — Assessment & Plan Note (Signed)
-  Continue Lyrica, Flexeril, Robaxin, oxycodone, tramadol

## 2021-09-12 NOTE — Assessment & Plan Note (Signed)
-  Continue Crestor 

## 2021-09-12 NOTE — Assessment & Plan Note (Signed)
-  Continue Ativan, doxepin, Effexor, Geodon

## 2021-09-12 NOTE — Assessment & Plan Note (Signed)
-  Continue CPAP.  

## 2021-09-12 NOTE — Assessment & Plan Note (Signed)
-  Weight loss

## 2021-09-12 NOTE — Progress Notes (Signed)
  Progress Note   Patient: [name] [id number]:[id number] DOB: [birth year] DOA: 08/31/2021     10 DOS: the patient was seen and examined on 09/12/2021 at 3:45PM      Brief hospital course: 66 y.o. female with medical history significant of remote breast CA; morbid obesity;  chronic back pain; stage 3 CKD; HTN; HLD; hypothyroidism; OSA on CPAP; DM; and schizophrenia presenting with falls.  She reports that she has a right prior knee surgery that has failed and cannot be surgically repaired as well as a right foot ligamentous injury that cannot be repaired.  Her right leg has been giving out when she tries to walk from bathroom to chair - she is consistently one step short before her leg gives out.  EMS has been out to help her up multiple times in the last few weeks and brought her to the ER  On further questioning, pt reports her primary complaint had been increasing LE swelling and weakness. Pt at baseline mobilizes via electric wheelchair and ambulates only to get to the bathroom.     Assessment and Plan: * Ambulatory dysfunction - Consult PT and OT and TOC  Chronic respiratory failure with hypoxia (HCC) At baseline  Schizophrenia ([place]) - Continue Ativan, doxepin, Effexor, Geodon  Chronic pain syndrome - Continue Lyrica, Flexeril, Robaxin, oxycodone, tramadol  Obesity, morbid, BMI 50 or higher (HCC) - Weight loss  Stage 3b chronic kidney disease (CKD) (HCC)    OSA on CPAP - Continue CPAP  Type II diabetes mellitus (HCC) Glucose normal - Continue SS corrections - Continue NPH  Anemia due to chronic kidney disease Hgb stable  Recurrent falls - PT  Essential hypertension - Continue furosemide,   HLD (hyperlipidemia) - Continue Crestor  Hypothyroidism - Continue levothyroxine          Subjective: No new complaints.  No more gas pains in abdomen.     Physical Exam: Vitals:   09/11/21 1655 09/11/21 2006 09/12/21 0427 09/12/21 0846  BP: 138/88 134/71   110/63  Pulse: 89 81  90  Resp: _0 Temp: 98.5 F (36.9 C) 97.8 F (36.6 C)  97.9 F (36.6 C)  TempSrc: Oral Oral  Oral  SpO2: 95% 97%  97%  Weight:   (!) 157.3 kg   Height:       Obese adult female, sitting in bed, no acute distress RRR no murmurs, no edema RR limited by habitus, lungs sounds distant, no rales or wheezing Abdomen not able to examine due to habitus Attention normal, affect normal.    Disposition: Status is: Inpatient         Author: Edwin Dada, MD 09/12/2021 3:44 PM  For on call review [email or web address].

## 2021-09-12 NOTE — Assessment & Plan Note (Signed)
Hgb stable

## 2021-09-12 NOTE — Assessment & Plan Note (Signed)
-  Continue levothyroxine 

## 2021-09-12 NOTE — Assessment & Plan Note (Signed)
-  Continue furosemide,

## 2021-09-12 NOTE — Progress Notes (Signed)
Physical Therapy Treatment Patient Details Name: [name] MRN: [id number] DOB: [birth year] Today's Date: 09/12/2021   History of Present Illness 66 y/o female admitted secondary to multiple falls. Admitted with Hypoxia likely secondary to pulm HTN/obesity hypoventilation and ambulation dysfunction due to RLE injuries. PMH includes Breast cancer, HSV  infection, Memory difficulty, OSA on CPAP,  Schizophrenia, Secondary parkinsonism, and Type II diabetes mellitus.    PT Comments    Pt agreeable to PT session. Pt states that RW hindering her more than helping her and not wanting to use for transfer to chair. Pt performed combination of lateral scoot and squat pivot transfer from bed to chair with minimal assistance x 2. Recommend pt continue with transfer training to maximize her current level of function.   Recommendations for follow up therapy are one component of a multi-disciplinary discharge planning process, led by the attending physician.  Recommendations may be updated based on patient status, additional functional criteria and insurance authorization.  Follow Up Recommendations  Skilled nursing-short term rehab (<3 hours/day) Can patient physically be transported by private vehicle: No   Assistance Recommended at Discharge Frequent or constant Supervision/Assistance  Patient can return home with the following Two people to help with walking and/or transfers;Two people to help with bathing/dressing/bathroom;Assistance with cooking/housework;Assist for transportation;Help with stairs or ramp for entrance   Equipment Recommendations  None recommended by PT    Recommendations for Other Services       Precautions / Restrictions Precautions Precautions: Fall Precaution Comments: Unable to recall how many falls she has had within the last month. 4 within a day Restrictions Weight Bearing Restrictions: No     Mobility  Bed Mobility Overal bed mobility: Needs Assistance Bed  Mobility: Supine to Sit     Supine to sit: Mod assist, +2 for physical assistance     General bed mobility comments: Sling pad and bed rail on R utilized (OOB to R side). Pt with dizziness sitting EOB but this improved.    Transfers Overall transfer level: Needs assistance Equipment used: None Transfers: Bed to chair/wheelchair/BSC            Lateral/Scoot Transfers: Min assist, +2 physical assistance, +2 safety/equipment, From elevated surface General transfer comment: Pt instructed on lateral scoot transfer technique from bed to droparm recliner. However, pt performing more of squat pivot transfer as she came up more into semi-standing position. Pt required cues for hand placements and technique. Sling pad utilized.    Ambulation/Gait               General Gait Details: Deferred   Stairs             Wheelchair Mobility    Modified Rankin (Stroke Patients Only)       Balance Overall balance assessment: Needs assistance Sitting-balance support: Feet supported, No upper extremity supported Sitting balance-Leahy Scale: Fair                                      Cognition Arousal/Alertness: Awake/alert Behavior During Therapy: Flat affect Overall Cognitive Status: No family/caregiver present to determine baseline cognitive functioning                                 General Comments: pleasant, participatory. benefits from cues to sequence and encouragement. Decreased safety awareness  Exercises      General Comments General comments (skin integrity, edema, etc.): 94% SpO2 and HR at 91 on 2.5 L O2      Pertinent Vitals/Pain Pain Assessment Pain Assessment: 0-10 Pain Score:  (DNQ) Pain Location: bil knees L shoulder, back Pain Descriptors / Indicators: Discomfort, Grimacing Pain Intervention(s): Limited activity within patient's tolerance, Monitored during session    Home Living                           Prior Function            PT Goals (current goals can now be found in the care plan section) Acute Rehab PT Goals PT Goal Formulation: With patient Time For Goal Achievement: 09/14/21 Potential to Achieve Goals:  (fair to good) Progress towards PT goals: Progressing toward goals (slowly)    Frequency    Min 2X/week      PT Plan Current plan remains appropriate    Co-evaluation              AM-PAC PT "6 Clicks" Mobility   Outcome Measure  Help needed turning from your back to your side while in a flat bed without using bedrails?: A Lot Help needed moving from lying on your back to sitting on the side of a flat bed without using bedrails?: A Lot Help needed moving to and from a bed to a chair (including a wheelchair)?: A Lot Help needed standing up from a chair using your arms (e.g., wheelchair or bedside chair)?: Total Help needed to walk in hospital room?: Total Help needed climbing 3-5 steps with a railing? : Total 6 Click Score: 9    End of Session Equipment Utilized During Treatment: Gait belt;Oxygen Activity Tolerance: Patient tolerated treatment well;Patient limited by fatigue;Patient limited by pain Patient left: in chair;with call bell/phone within reach;with chair alarm set Nurse Communication: Mobility status;Need for lift equipment PT Visit Diagnosis: Other abnormalities of gait and mobility (R26.89);History of falling (Z91.81);Muscle weakness (generalized) (M62.81);Difficulty in walking, not elsewhere classified (R26.2);Pain Pain - Right/Left: Left Pain - part of body: Knee;Shoulder     Time: 8127-5170 PT Time Calculation (min) (ACUTE ONLY): 17 min  Charges:  $Therapeutic Activity: 8-22 mins                     Donna Bernard, PT    [facility] 09/12/2021, 2:46 PM

## 2021-09-12 NOTE — TOC Progression Note (Signed)
Transition of Care [facility]) - Progression Note    Patient Details  Name: [name] MRN: [id number] Date of Birth: 06/30/1955  [facility] CM/SW Contact  Reece Agar, [place] Phone Number: 09/12/2021, 11:05 AM  Clinical Narrative:    CSW spoke with pt about final DC plan, pt is still adamant about not going to [facility] or [place] but is willing to try [facility] if they have a bed. CSW contacted Shirlee Limerick who will review pt and call back for a offer decision.    Expected Discharge Plan: [place] Barriers to Discharge: No Barriers Identified  Expected Discharge Plan and Services Expected Discharge Plan: [place]   Discharge Planning Services: CM Consult Post Acute Care Choice: [place] Living arrangements for the past 2 months: Single Family Home Expected Discharge Date: 09/11/21               DME Arranged: Oxygen DME Agency: [employer] Date DME Agency Contacted: 09/12/21     HH Arranged: PT, OT HH Agency: [place] Date [place]: 09/11/21 Time [place]: 1628 Representative spoke with at [place]: [place] Determinants of Health ([place]) Interventions    Readmission Risk Interventions     No data to display

## 2021-09-12 NOTE — Assessment & Plan Note (Signed)
-  PT

## 2021-09-13 LAB — GLUCOSE, CAPILLARY
Glucose-Capillary: 101 mg/dL — ABNORMAL HIGH (ref 70–99)
Glucose-Capillary: 135 mg/dL — ABNORMAL HIGH (ref 70–99)
Glucose-Capillary: 164 mg/dL — ABNORMAL HIGH (ref 70–99)

## 2021-09-13 MED ORDER — LORAZEPAM 1 MG PO TABS: 1.0000 mg | ORAL_TABLET | Freq: Three times a day (TID) | ORAL | 0 refills | Status: DC

## 2021-09-13 MED ORDER — BELBUCA 300 MCG BU FILM
300.0000 ug | ORAL_FILM | Freq: Two times a day (BID) | BUCCAL | 0 refills | Status: DC
Start: 2021-09-13 — End: 2022-07-25

## 2021-09-13 NOTE — Discharge Summary (Signed)
Physician Discharge Summary   Patient: [name] MRN: [id number] DOB: 10-Apr-1955  Admit date:     08/31/2021  Discharge date: 09/13/21  Discharge Physician: Edwin Dada   PCP: Fredrich Romans, PA     Recommendations at discharge:  Follow up with PCP Fredrich Romans within 1 week of discharge from SNF Ms McCune: Please refer to Pulmonology for obesity hypoventilation syndrome and Pulmonary hypertension     Discharge Diagnoses: Principal Problem:   Ambulatory dysfunction Active Problems:   Chronic respiratory failure with hypoxia (HCC)   Hypothyroidism   HLD (hyperlipidemia)   Essential hypertension   Recurrent falls   Anemia due to chronic kidney disease   Type II diabetes mellitus (HCC)   OSA on CPAP   Stage 3b chronic kidney disease (CKD) (HCC)   Obesity, morbid, BMI 53   Chronic pain syndrome   Schizophrenia [facility])      Hospital Course: [name] is a 66 y.o. F with morbid obesity, chronic pain on daily opiates, CKD IIIb, hypothyroidism, dCHF, HTN and OSA who presented with falls.    Reported that her right leg kept giving out while walking, due to a failed right knee surgery and right foot injury.     Ambulatory dysfunction due to osteoarthritis Admitted and imaging of the right leg was unremarkable.  She was evaluated by PT who recommended SNF.    Chronic respiratory failure due to:     Obesity hypoventilation syndrome     Sleep apnea     Chronic diastolic CHF      Pulmonary hypertension She was noted to be hypoxic, but had normal CT chest.  Given leg swelling and hypoxia, she was treated with IV Lasix and her creatinine increased, so she was put back on her home Lasix.  She had echo that showed pulmonary hypertension, likely from OSA and OHS.  She was discharged on O2 and should have Pulmonary follow up.           [facility] Controlled Substances Registry was reviewed for this patient prior to discharge.   Disposition:  SNF   DISCHARGE MEDICATION: Allergies as of 09/13/2021       Reactions   Bee Venom Anaphylaxis   Invokana [canagliflozin] Anaphylaxis   Dyspnea and urinary retention   Shellfish Allergy Anaphylaxis   Buprenorphine Hcl Other (See Comments)   Overly sedated with morphine drip.   Other Other (See Comments)   Cats - bad asthma, stopped up        Medication List     STOP taking these medications    amLODipine 10 MG tablet Commonly known as: NORVASC   cyclobenzaprine 10 MG tablet Commonly known as: FLEXERIL   doxepin 50 MG capsule Commonly known as: SINEQUAN   losartan-hydrochlorothiazide 100-25 MG tablet Commonly known as: HYZAAR   methocarbamol 750 MG tablet Commonly known as: ROBAXIN   traMADol 50 MG tablet Commonly known as: Ultram       TAKE these medications    acetaminophen 650 MG CR tablet Commonly known as: TYLENOL Take 1,300 mg by mouth every 8 (eight) hours as needed for pain.   albuterol 108 (90 Base) MCG/ACT inhaler Commonly known as: VENTOLIN HFA Inhale 2 puffs into the lungs every 4 (four) hours as needed for wheezing or shortness of breath.   aspirin EC 81 MG tablet Take 1 tablet (81 mg total) by mouth daily.   Belbuca 300 MCG Film Generic drug: Buprenorphine HCl Take 300 mcg by mouth 2 (  two) times daily.   belladonna-opium 16.2-30 MG suppository Commonly known as: B&O SUPPRETTES Place 1 suppository rectally 2 (two) times daily as needed (for bladder spasms).   calcitRIOL 0.25 MCG capsule Commonly known as: ROCALTROL TAKE ONE CAPSULE BY MOUTH three times PER WEEK   esomeprazole 40 MG capsule Commonly known as: NEXIUM Take 1 capsule (40 mg total) by mouth daily before breakfast.   famotidine 40 MG tablet Commonly known as: PEPCID Take 40 mg by mouth daily after supper.   ferrous sulfate 325 (65 FE) MG tablet Take 325 mg by mouth in the morning.   furosemide 20 MG tablet Commonly known as: LASIX Take 2 tablets (40 mg total) by  mouth daily. What changed:  how much to take when to take this   HumuLIN N KwikPen 100 UNIT/ML Kiwkpen Generic drug: Insulin NPH (Human) (Isophane) Inject 55 Units into the skin every morning. And pen needles 1/day   levothyroxine 200 MCG tablet Commonly known as: SYNTHROID Take 1 tablet (200 mcg total) by mouth daily before breakfast.   linaclotide 290 MCG Caps capsule Commonly known as: LINZESS Take 290 mcg by mouth daily before breakfast.   LORazepam 1 MG tablet Commonly known as: ATIVAN Take 1 tablet (1 mg total) by mouth in the morning, at noon, and at bedtime. What changed: Another medication with the same name was removed. Continue taking this medication, and follow the directions you see here.   lubiprostone 24 MCG capsule Commonly known as: AMITIZA Take 24 mcg by mouth 2 (two) times daily.   mirabegron ER 25 MG Tb24 tablet Commonly known as: MYRBETRIQ Take 25 mg by mouth in the morning.   naloxone 4 MG/0.1ML Liqd nasal spray kit Commonly known as: NARCAN Place 1 spray into the nose as needed for opioid reversal.   OneTouch Delica Plus [id number] Misc 1 each by Other route 3 (three) times daily. Use to check blood sugar 3 times a day   OneTouch Verio test strip Generic drug: glucose blood TEST BLOOD SUGAR THREE TIMES DAILY   polyvinyl alcohol 1.4 % ophthalmic solution Commonly known as: LIQUIFILM TEARS 1 drop as needed for dry eyes.   potassium chloride SA 20 MEQ tablet Commonly known as: KLOR-CON M Take 20 mEq by mouth daily.   pregabalin 300 MG capsule Commonly known as: LYRICA Take 300 mg by mouth 2 (two) times daily.   rosuvastatin 20 MG tablet Commonly known as: CRESTOR Take 20 mg by mouth daily.   sucralfate 1 g tablet Commonly known as: CARAFATE Take 1 g by mouth in the morning.   Sure Comfort Pen Needles 32G X 6 MM Misc Generic drug: Insulin Pen Needle USE TO INJECT INSULIN FOUR TIMES DAILY   tolterodine 2 MG 24 hr capsule Commonly  known as: DETROL LA Take 2 mg by mouth every evening.   Trulicity 4.5 WI/0.9BD Sopn Generic drug: Dulaglutide Inject 4.5 mg into the skin once a week. What changed: when to take this   valACYclovir 1000 MG tablet Commonly known as: VALTREX Take 1,000 mg by mouth daily.   venlafaxine XR 150 MG 24 hr capsule Commonly known as: EFFEXOR-XR Take 150 mg by mouth 2 (two) times daily.   VITAMIN B-12 PO Take 1 tablet by mouth daily.   VITAMIN C PO Take 1 tablet by mouth in the morning.   Vitamin D (Ergocalciferol) 1.25 MG (50000 UNIT) Caps capsule Commonly known as: DRISDOL Take 1 capsule (50,000 Units total) by mouth every 14 (fourteen) days.  Vitamin D-3 25 MCG (1000 UT) Caps Take 1,000 Units by mouth daily.   ziprasidone 60 MG capsule Commonly known as: GEODON Take 60 mg by mouth daily after supper.               Durable Medical Equipment  (From admission, onward)           Start     Ordered   09/11/21 1637  DME Oxygen  (Discharge Planning)  Once       Question Answer Comment  Length of Need Lifetime   Mode or (Route) Nasal cannula   Liters per Minute 2   Frequency Continuous (stationary and portable oxygen unit needed)   Oxygen delivery system Gas      09/11/21 1636   09/01/21 1350  For home use only DME oxygen  Once       Question Answer Comment  Length of Need Lifetime   Mode or (Route) Nasal cannula   Liters per Minute 3   Frequency Continuous (stationary and portable oxygen unit needed)   Oxygen conserving device Yes   Oxygen delivery system Gas      09/01/21 1350   09/01/21 1252  For home use only DME 3 n 1  Once       Comments: Need Bariatric   09/01/21 1251            Follow-up Information     Fredrich Romans, PA. Schedule an appointment as soon as possible for a visit in 1 week(s).   Specialty: Physician Assistant Contact information: [place] [address] [phone]         Care, [facility]  Follow up.   Why: for home health services Contact information: [place] [address] [phone]                 Discharge Instructions     Discharge instructions   Complete by: As directed    Go see your primary care doctor in 1 week   Increase activity slowly   Complete by: As directed        Discharge Exam: Filed Weights   09/11/21 0630 09/12/21 0427 09/13/21 0500  Weight: (!) 158.7 kg (!) 157.3 kg (!) 158.5 kg    General: Pt is alert, awake, not in acute distress, sitting up in recliner eating lunch, morbidly obese Cardiovascular: RRR, nl S1-S2, no murmurs appreciated.   No LE edema.   Respiratory: Normal respiratory rate and rhythm.  CTAB without rales or wheezes. Lung sounds diminished throughout due to body habitus Neuro/Psych: Strength symmetric in upper and lower extremities.  Judgment and insight appear normal.   Condition at discharge: stable  The results of significant diagnostics from this hospitalization (including imaging, microbiology, ancillary and laboratory) are listed below for reference.   Imaging Studies: DG Chest Port 1 View  Result Date: 09/06/2021 CLINICAL DATA:  [id number] EXAM: PORTABLE CHEST 1 VIEW COMPARISON:  August 31, 2021 FINDINGS: Mild-to-moderate cardiomegaly, stable. There is pulmonary vascular congestion seen and slightly more prominent in the present study. Low lung volume. Previously seen opacity at the left midlung has resolved. Likely minor left basilar atelectasis. Stable surgical clips at the right thyroid region. The visualized skeletal structures are unremarkable. IMPRESSION: Mild-to-moderate cardiomegaly, stable. Mild pulmonary vascular congestion which appears slightly more prominent in the present study. There has been interval resolution of the opacity at the left mid lung. Electronically Signed   By: Glori Bickers.D.  On: 09/06/2021 09:50   ECHOCARDIOGRAM COMPLETE  Result Date: 09/02/2021     ECHOCARDIOGRAM REPORT   Patient Name:   REBBECA SHEPERD Date of Exam: 09/02/2021 Medical Rec #:  [id number]        Height:       65.0 in Accession #:    [id number]       Weight:       320.0 lb Date of Birth:  Dec 02, 1955        BSA:          2.415 m Patient Age:    66 years         BP:           121/74 mmHg Patient Gender: F                HR:           91 bpm. Exam Location:  Inpatient Procedure: 2D Echo, Cardiac Doppler, Color Doppler and Intracardiac            Opacification Agent Indications:    CHF- acute diastolic  History:        Patient has prior history of Echocardiogram examinations, most                 recent 03/30/2016. Signs/Symptoms:Edema; Risk                 Factors:Hypertension, Diabetes and HLD. Remote breast CA ,                 hypothyroidism.  Sonographer:    Joette Catching RCS Referring Phys: [phone] STEPHEN K CHIU  Sonographer Comments: Patient is morbidly obese. IMPRESSIONS  1. Left ventricular ejection fraction, by estimation, is 65 to 70%. The left ventricle has normal function. The left ventricle has no regional wall motion abnormalities. Left ventricular diastolic parameters are indeterminate.  2. Right ventricular systolic function is normal. The right ventricular size is normal. There is normal pulmonary artery systolic pressure.  3. The mitral valve is grossly normal. Trivial mitral valve regurgitation. No evidence of mitral stenosis.  4. The aortic valve was not well visualized. Aortic valve regurgitation is not visualized. Mild to moderate aortic valve stenosis.  5. The inferior vena cava is dilated in size with <50% respiratory variability, suggesting right atrial pressure of 15 mmHg. Comparison(s): No significant change from prior study. Conclusion(s)/Recommendation(s): Aortic valve with moderate stenosis by gradients, but valve not well visualized-cannot determine mobility/morphology. FINDINGS  Left Ventricle: Left ventricular ejection fraction, by estimation, is 65 to 70%. The left  ventricle has normal function. The left ventricle has no regional wall motion abnormalities. Definity contrast agent was given IV to delineate the left ventricular  endocardial borders. The left ventricular internal cavity size was normal in size. Suboptimal image quality limits for assessment of left ventricular hypertrophy. Left ventricular diastolic parameters are indeterminate. Right Ventricle: The right ventricular size is normal. Right vetricular wall thickness was not well visualized. Right ventricular systolic function is normal. There is normal pulmonary artery systolic pressure. The tricuspid regurgitant velocity is 2.02 m/s, and with an assumed right atrial pressure of 15 mmHg, the estimated right ventricular systolic pressure is 09.9 mmHg. Left Atrium: Left atrial size was normal in size. Right Atrium: Right atrial size was not well visualized. Pericardium: There is no evidence of pericardial effusion. Presence of epicardial fat layer. Mitral Valve: The mitral valve is grossly normal. Trivial mitral valve regurgitation. No evidence of mitral valve stenosis. Tricuspid Valve: The  tricuspid valve is not well visualized. Tricuspid valve regurgitation is trivial. No evidence of tricuspid stenosis. Aortic Valve: The aortic valve was not well visualized. Aortic valve regurgitation is not visualized. Mild to moderate aortic stenosis is present. Aortic valve mean gradient measures 23.0 mmHg. Aortic valve peak gradient measures 40.4 mmHg. Aortic valve area, by VTI measures 1.07 cm. Pulmonic Valve: The pulmonic valve was not well visualized. Pulmonic valve regurgitation is not visualized. Aorta: The aortic root, ascending aorta and aortic arch are all structurally normal, with no evidence of dilitation or obstruction. Venous: The inferior vena cava is dilated in size with less than 50% respiratory variability, suggesting right atrial pressure of 15 mmHg. IAS/Shunts: The interatrial septum was not well visualized.   LEFT VENTRICLE PLAX 2D LVOT diam:     2.00 cm     Diastology LV SV:         54          LV e' medial:    7.07 cm/s LV SV Index:   22          LV E/e' medial:  16.5 LVOT Area:     3.14 cm    LV e' lateral:   9.36 cm/s                            LV E/e' lateral: 12.5  LV Volumes (MOD) LV vol d, MOD A2C: 79.1 ml LV vol d, MOD A4C: 83.2 ml LV vol s, MOD A2C: 19.9 ml LV vol s, MOD A4C: 40.8 ml LV SV MOD A2C:     59.2 ml LV SV MOD A4C:     83.2 ml LV SV MOD BP:      53.9 ml RIGHT VENTRICLE             IVC RV S prime:     12.50 cm/s  IVC diam: 2.10 cm TAPSE (M-mode): 2.4 cm LEFT ATRIUM             Index LA diam:        3.90 cm 1.61 cm/m LA Vol (A2C):   34.6 ml 14.32 ml/m LA Vol (A4C):   30.6 ml 12.67 ml/m LA Biplane Vol: 34.4 ml 14.24 ml/m  AORTIC VALVE AV Area (Vmax):    1.03 cm AV Area (Vmean):   1.04 cm AV Area (VTI):     1.07 cm AV Vmax:           318.00 cm/s AV Vmean:          198.600 cm/s AV VTI:            0.504 m AV Peak Grad:      40.4 mmHg AV Mean Grad:      23.0 mmHg LVOT Vmax:         104.00 cm/s LVOT Vmean:        65.700 cm/s LVOT VTI:          0.171 m LVOT/AV VTI ratio: 0.34  AORTA Ao Root diam: 3.10 cm Ao Asc diam:  3.00 cm MITRAL VALVE                TRICUSPID VALVE MV Area (PHT): 3.99 cm     TR Peak grad:   16.3 mmHg MV Decel Time: 190 msec     TR Vmax:        202.00 cm/s MV E velocity: 117.00 cm/s MV A velocity: 121.00 cm/s  SHUNTS MV  E/A ratio:  0.97         Systemic VTI:  0.17 m                             Systemic Diam: 2.00 cm Buford Dresser MD Electronically signed by Buford Dresser MD Signature Date/Time: 09/02/2021/7:07:11 PM    Final    CT CHEST WO CONTRAST  Result Date: 09/02/2021 CLINICAL DATA:  Multiple falls, abnormal chest radiograph, concern for lung mass EXAM: CT CHEST WITHOUT CONTRAST TECHNIQUE: Multidetector CT imaging of the chest was performed following the standard protocol without IV contrast. RADIATION DOSE REDUCTION: This exam was performed according to the  departmental dose-optimization program which includes automated exposure control, adjustment of the mA and/or kV according to patient size and/or use of iterative reconstruction technique. COMPARISON:  Chest radiographs, 08/31/2021, CT chest, 09/29/2020 FINDINGS: Cardiovascular: Aortic atherosclerosis. Aortic valve calcifications. Cardiomegaly. Three-vessel coronary artery calcifications. Gross enlargement of the main pulmonary artery measuring up to 4.1 cm in caliber. No pericardial effusion. Mediastinum/Nodes: No enlarged mediastinal, hilar, or axillary lymph nodes. Status post right lobe thyroidectomy. Trachea, and esophagus demonstrate no significant findings. Lungs/Pleura: Dependent bibasilar atelectasis or consolidation. Bandlike scarring or atelectasis of the bilateral lung bases. No pleural effusion or pneumothorax. Upper Abdomen: No acute abnormality. Musculoskeletal: No chest wall abnormality. No suspicious osseous lesions identified. IMPRESSION: 1. Dependent bibasilar atelectasis or consolidation. Bandlike scarring or atelectasis of the bilateral lung bases. No evidence of pulmonary mass. Radiographic abnormality previously identified may have reflected transient atelectasis or superimposition of scarring. 2. Cardiomegaly and coronary artery disease. 3. Gross enlargement of the main pulmonary artery as can be seen in pulmonary arterial hypertension. 4. Aortic valve calcifications. Correlate for echocardiographic evidence of aortic valve dysfunction. Aortic Atherosclerosis (ICD10-I70.0). Electronically Signed   By: Delanna Ahmadi M.D.   On: 09/02/2021 16:26   DG Knee Complete 4 Views Right  Result Date: 08/31/2021 CLINICAL DATA:  Trauma, pain EXAM: RIGHT KNEE - COMPLETE 4+ VIEW COMPARISON:  03/08/2018 FINDINGS: Examination is technically less than optimal due to patient's body habitus and pain. There is previous right knee arthroplasty. No recent fracture is seen. There is marked edema in subcutaneous  plane. Smooth marginated calcifications adjacent to the patella and distal femur may be residual from previous surgery. IMPRESSION: Status post previous right knee arthroplasty. No recent fracture is seen. Electronically Signed   By: Elmer Picker M.D.   On: 08/31/2021 12:47   DG Chest Port 1 View  Result Date: 08/31/2021 CLINICAL DATA:  66 year old female with history of weakness and shortness of breath. EXAM: PORTABLE CHEST 1 VIEW COMPARISON:  Chest x-ray 09/30/2010. FINDINGS: Lung volumes are normal. Patchy areas of interstitial prominence and peribronchial cuffing noted throughout the mid to lower lung bilaterally, where there also several ill-defined opacities, most notably in the left mid lung where there is a ill-defined nodular density which is slightly more prominent than the prior chest x-ray. No pleural effusions. No pneumothorax. Cephalization of the pulmonary vasculature. Heart size is mildly enlarged. Upper mediastinal contours are within normal limits. Surgical clips project over the lower right cervical region, likely from prior thyroidectomy. IMPRESSION: 1. Increasingly conspicuous nodular density in the left mid lung concerning for potential neoplasm. Further evaluation with nonemergent chest CT is recommended in the near future to better evaluate this finding. 2. Diffuse bronchial wall thickening and peribronchial cuffing, which could indicate an acute bronchitis. 3. Cardiomegaly. Electronically Signed   By: Quillian Quince  Entrikin M.D.   On: 08/31/2021 05:58    Microbiology: Results for orders placed or performed during the hospital encounter of 03/12/21  Surgical PCR Screen     Status: None   Collection Time: 03/12/21  8:38 AM   Specimen: Nasal Mucosa; Nasal Swab  Result Value Ref Range Status   MRSA, PCR NEGATIVE NEGATIVE Final   Staphylococcus aureus NEGATIVE NEGATIVE Final    Comment: (NOTE) The Xpert SA Assay (FDA approved for NASAL specimens in patients 54 years of age and  older), is one component of a comprehensive surveillance program. It is not intended to diagnose infection nor to guide or monitor treatment. Performed at [facility], [place] [address], [place], [place] [address]    *Note: Due to a large number of results and/or encounters for the requested time period, some results have not been displayed. A complete set of results can be found in Results Review.    Labs: CBC: Recent Labs  Lab 09/07/21 0139 09/08/21 0242 09/09/21 0108 09/10/21 0249 09/11/21 0320  WBC 6.0 5.6 6.6 5.8 7.2  NEUTROABS 3.9 3.4 4.0 3.7 4.5  HGB 8.3* 8.4* 8.7* 9.5* 10.0*  HCT 26.3* 26.5* 27.2* 30.6* 31.7*  MCV 88.9 88.6 87.5 90.3 89.0  PLT 231 256 271 238 859    Basic Metabolic Panel: Recent Labs  Lab 09/07/21 0139 09/08/21 0242 09/09/21 0108 09/10/21 0249 09/11/21 0320  NA 141 142 143 141 139  K 4.0 3.8 4.1 4.4 4.1  CL 102 104 107 104 103  CO2 _0 GLUCOSE 138* 129* 122* 123* 175*  BUN 25* 27* 28* 28* 27*  CREATININE 1.13* 1.18* 1.50* 1.33* 1.38*  CALCIUM 9.0 9.3 9.4 9.4 9.4    Liver Function Tests: No results for input(s): "AST", "ALT", "ALKPHOS", "BILITOT", "PROT", "ALBUMIN" in the last 168 hours. CBG: Recent Labs  Lab 09/12/21 1138 09/12/21 1658 09/12/21 2130 09/13/21 0905 09/13/21 1210  GLUCAP 102* 136* 136* 101* 135*     Discharge time spent: approximately 35 minutes spent on discharge counseling, evaluation of patient on day of discharge, and coordination of discharge planning with nursing, social work, pharmacy and case management  Signed: Edwin Dada, MD [facility] 09/13/2021

## 2021-09-13 NOTE — TOC Transition Note (Signed)
Transition of Care [facility]) - CM/SW Discharge Note   Patient Details  Name: [name] MRN: [id number] Date of Birth: 1955-07-24  Transition of Care [facility]) CM/SW Contact:  Tresa Endo Phone Number: 09/13/2021, 1:04 PM   Clinical Narrative:    Patient will DC to [facility] Anticipated DC date: 09/13/2021 Family notified: Pt contacted family  Transport by: Corey Harold   Per MD patient ready for DC to [facility]. RN to call report prior to discharge [phone]). RN, patient, patient's family, and facility notified of DC. Discharge Summary and FL2 sent to facility. DC packet on chart. Ambulance transport requested for patient.   CSW will sign off for now as social work intervention is no longer needed. Please consult [place] again if new needs arise.     Final next level of care: [place] Barriers to Discharge: No Barriers Identified   Patient Goals and CMS Choice Patient states their goals for this hospitalization and ongoing recovery are:: Rehab CMS Medicare.gov Compare Post Acute Care list provided to:: Patient Choice offered to / list presented to : Patient  Discharge Placement                       Discharge Plan and Services   Discharge Planning Services: CM Consult Post Acute Care Choice: Skilled Nursing Facility          DME Arranged: Oxygen DME Agency: [employer] Date DME Agency Contacted: 09/12/21     HH Arranged: PT, OT HH Agency: [place] Date Advance: 09/11/21 Time [place]: 1628 Representative spoke with at [place]: [place] Determinants of Health ([place]) Interventions     Readmission Risk Interventions     No data to display

## 2021-09-13 NOTE — Progress Notes (Signed)
Mobility Specialist - Progress Note   09/13/21 1245  Mobility  Activity Transferred from chair to bed  Level of Assistance Minimal assist, patient does 75% or more (+2)  Assistive Device None  Distance Ambulated (ft) 2 ft  Activity Response Tolerated well  $Mobility charge 1 Mobility   Pt received from recliner and agreeable to mobility. MinA+2 used to transfer pt from chair into bed. Pt left in bed with call bell and all needs met.   Paulla Dolly Mobility Specialist

## 2021-09-13 NOTE — Progress Notes (Signed)
Mobility Specialist - Progress Note   09/13/21 1022  Mobility  Activity Transferred from bed to chair  Level of Assistance Moderate assist, patient does 50-74% (+2)  Assistive Device None  Distance Ambulated (ft) 2 ft  Activity Response Tolerated fair  $Mobility charge 1 Mobility   Pt received in bed and agreeable to mobility. Required ModA +2 to stand and pivot to chair. Pt left in recliner with chair alarm on, call bell in reach and all needs met.   Paulla Dolly Mobility Specialist

## 2021-09-27 ENCOUNTER — Other Ambulatory Visit: Payer: Self-pay

## 2021-09-27 DIAGNOSIS — N2581 Secondary hyperparathyroidism of renal origin: Secondary | ICD-10-CM

## 2021-09-27 MED ORDER — CALCITRIOL 0.25 MCG PO CAPS: ORAL_CAPSULE | ORAL | 3 refills | Status: DC

## 2021-10-17 ENCOUNTER — Other Ambulatory Visit: Payer: Self-pay

## 2021-10-17 DIAGNOSIS — E1122 Type 2 diabetes mellitus with diabetic chronic kidney disease: Secondary | ICD-10-CM

## 2021-10-17 MED ORDER — TRULICITY 4.5 MG/0.5ML ~~LOC~~ SOAJ: 4.5000 mg | SUBCUTANEOUS | 3 refills | Status: DC

## 2021-11-13 ENCOUNTER — Ambulatory Visit (INDEPENDENT_AMBULATORY_CARE_PROVIDER_SITE_OTHER): Payer: Medicare Other | Admitting: Internal Medicine

## 2021-11-13 ENCOUNTER — Encounter: Payer: Self-pay | Admitting: Internal Medicine

## 2021-11-13 VITALS — BP 140/82 | HR 86 | Ht 65.0 in

## 2021-11-13 DIAGNOSIS — E1159 Type 2 diabetes mellitus with other circulatory complications: Secondary | ICD-10-CM | POA: Diagnosis not present

## 2021-11-13 DIAGNOSIS — E1165 Type 2 diabetes mellitus with hyperglycemia: Secondary | ICD-10-CM | POA: Diagnosis not present

## 2021-11-13 DIAGNOSIS — E890 Postprocedural hypothyroidism: Secondary | ICD-10-CM

## 2021-11-13 LAB — POCT GLYCOSYLATED HEMOGLOBIN (HGB A1C): Hemoglobin A1C: 6.8 % — AB (ref 4.0–5.6)

## 2021-11-13 NOTE — Progress Notes (Addendum)
Patient ID: [name], female   DOB: 02-Sep-1955, 66 y.o.   MRN: [id number]  HPI: [name] is a 66 y.o.-year-old female, returning for follow-up for DM2, dx in 2014, insulin-dependent since 2015, uncontrolled, with  complications (h/o TIA, PVD, CKD, PN), also postsurgical hypothyroidism and hypoparathyroidism. Pt. previously saw Dr. Loanne Drilling, last visit 6 months ago.  DM2: Reviewed HbA1c: Lab Results  Component Value Date   HGBA1C 9.6 (A) 04/26/2021   HGBA1C 10.4 (A) 02/26/2021   HGBA1C 8.3 (A) 12/14/2020   HGBA1C 9.4 (H) 09/30/2020   HGBA1C 9.0 (A) 07/10/2020   HGBA1C 8.5 (A) 03/06/2020   HGBA1C 8.1 (A) 01/04/2020   HGBA1C 7.6 (A) 11/02/2019   HGBA1C 7.8 (A) 08/29/2019   HGBA1C 8.0 (A) 06/29/2019   Pt is on a regimen of: - Trulicity 4.5 mg weekly - NPH 45 >> 55 units in am (before or after b'fast)  Pt checks her sugars 1x a day and they are: - am: 96-150 - 2h after b'fast: n/c - before lunch: n/c - 2h after lunch: n/c - before dinner: n/c - 2h after dinner: n/c - bedtime: n/c - nighttime: n/c Lowest sugar was 75; she has hypoglycemia awareness at 70.  Highest sugar was 150.  Glucometer: One Touch Verio  - + CKD, last BUN/creatinine:  Lab Results  Component Value Date   BUN 27 (H) 09/11/2021   BUN 28 (H) 09/10/2021   CREATININE 1.38 (H) 09/11/2021   CREATININE 1.33 (H) 09/10/2021  She is on losartan 100 mg daily.  -+ HL; last set of lipids: Lab Results  Component Value Date   CHOL 111 02/29/2016   HDL 55 02/29/2016   LDLCALC 35 02/29/2016   TRIG 104 02/29/2016   CHOLHDL 2.0 02/29/2016  She is on Crestor 20 mg daily.  - last eye exam was in 08/14/2021: No DR.   - no numbness and tingling in her feet.  Last foot exam was on 10/01/2021. Seeing Dr. Fritzi Mandes.  Postsurgical hypothyroidism:  Pt is on levothyroxine 200 mcg daily, taken: - in am - fasting - at least 30 min from b'fast - no calcium - + iron - no multivitamins - + PPIs and H2B -  not on Biotin  Reviewed her TSH levels: Lab Results  Component Value Date   TSH 5.13 02/26/2021   TSH 8.111 (H) 10/03/2020   TSH 3.752 09/30/2020   TSH 4.85 (H) 07/10/2020   TSH 7.74 (H) 05/08/2020   TSH 5.86 (H) 01/04/2020   TSH 4.42 06/29/2019   TSH 1.41 12/17/2018   TSH 1.04 09/02/2018   TSH 8.04 (H) 03/23/2018   TSH 3.01 12/15/2017   TSH 4.04 06/15/2017   TSH 4.769 (H) 11/14/2016   TSH 1.63 08/22/2016   TSH 10.93 (A) 04/10/2016   TSH 2.363 03/28/2016   TSH 0.269 (L) 02/14/2016   TSH 2.185 06/23/2015   TSH 1.549 10/28/2014   TSH 0.811 08/14/2014   Postsurgical hypocalcemia/hypoparathyroidism:  Reviewed pertinent labs: Lab Results  Component Value Date   PTH 36 04/26/2021   PTH 38 05/08/2020   PTH 55 01/04/2020   PTH 31 03/23/2018   PTH 80 (H) 08/22/2016   CALCIUM 9.4 09/11/2021   CALCIUM 9.4 09/10/2021   CALCIUM 9.4 09/09/2021   CALCIUM 9.3 09/08/2021   CALCIUM 9.0 09/07/2021   CALCIUM 9.4 09/06/2021   CALCIUM 9.3 09/05/2021   CALCIUM 8.7 (L) 09/04/2021   CALCIUM 8.9 09/03/2021   CALCIUM 9.1 09/02/2021   CALCIUM 9.0 09/01/2021  CALCIUM 9.4 08/31/2021   CALCIUM 9.9 04/26/2021   CALCIUM 9.4 03/12/2021   CALCIUM 9.9 02/26/2021   CALCIUM 9.9 11/10/2020   CALCIUM 8.8 (L) 10/05/2020   CALCIUM 9.2 10/03/2020   CALCIUM 9.4 10/02/2020   CALCIUM 9.3 10/01/2020   Levels have been normal: Lab Results  Component Value Date   VD25OH 73.07 04/26/2021   VD25OH 77.34 05/08/2020   VD25OH 73.50 01/04/2020   VD25OH 45.33 03/23/2018   VD25OH 43.39 08/22/2016   VD25OH 40 05/12/2013   She is on: - Calcitriol 0.25 mg 3x a week - vit D3 1000 units daily  No perioral numbness. She has muscle cramping.  Patient also has a history of schizophrenia, HTN, anemia, BPPV, GERD, breast cancer, OSA-on, obesity, low back pain.  ROS: + see HPI No increased urination, blurry vision, nausea, chest pain.  Past Medical History:  Diagnosis Date   Anemia    Anxiety and  depression    Asthma    Breast cancer ([place]) 02/13/2012   ruq  100'clock bx Ductal Carcinoma in Situ,(0/1) lymph node neg, treated with radiation   Chronic lower back pain    CKD (chronic kidney disease), stage III ([place])    "lower stage" (01/06/2014)   Gait abnormality 05/29/2016   GERD (gastroesophageal reflux disease)    History of hiatal hernia    History of stomach ulcers    HSV (herpes simplex virus) infection    Hyperlipemia    Hypertension    sees Dr. 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   partial   benign cyst  Left    removed from left breast   BREAST BIOPSY Right 02/13/2012   BREAST LUMPECTOMY  03/03/2012   Procedure: LUMPECTOMY;  Surgeon: Haywood Lasso, MD;  Location: [facility];  Service: General;  Laterality: Right;   BREAST SURGERY Right 01/2012   "cancer"   CHOLECYSTECTOMY  1980's?   COLONOSCOPY WITH PROPOFOL N/A 09/28/2012   Procedure: COLONOSCOPY WITH PROPOFOL;  Surgeon: Lear Ng, MD;  Location: [facility];  Service: Endoscopy;  Laterality: N/A;   FOOT FRACTURE SURGERY Right 1990's   JOINT REPLACEMENT      x 3   LESION EXCISION N/A 03/13/2021   Procedure: EXCISION SCALP SEBACEOUS CYSTS x2;  Surgeon: Ralene Ok, MD;  Location: [facility];  Service: General;  Laterality: N/A;  60 MINUTES LOCAL & MAC   LUMBAR LAMINECTOMY/DECOMPRESSION MICRODISCECTOMY N/A 12/05/2016   Procedure: LEFT L5-S1 MICRODISCECTOMY;  Surgeon: Marybelle Killings, MD;  Location: [facility];  Service: Orthopedics;  Laterality: N/A;   REVISION TOTAL KNEE ARTHROPLASTY  07/2009   SHOULDER OPEN ROTATOR CUFF REPAIR Left 1990's   THYROIDECTOMY  1970's    TOTAL KNEE ARTHROPLASTY Bilateral 2009-06/2009   left; right   Social History   Socioeconomic History   Marital status: Single    Spouse name: Not on file   Number of children: 2   Years of education: 12   Highest education level: Not on file  Occupational History   Occupation: Disabled  Tobacco Use   Smoking status: Former  Packs/day: 1.00    Years: 7.00    Total pack years: 7.00    Types: Cigarettes    Quit date: 03/24/1992    Years since quitting: 29.6   Smokeless tobacco: Never  Vaping Use   Vaping Use: Never used  Substance and Sexual Activity   Alcohol use: No    Comment: "stopped drinking in 1996; I was an alcoholic"   Drug use: No   Sexual activity: Never    Birth control/protection: Surgical  Other Topics Concern   Not on file  Social History Narrative   Single. Disabled Biochemist, clinical.          Patient is right handed.    Denies caffeine use    Social Determinants of Radio broadcast assistant Strain: Not on file  Food Insecurity: Not on file  Transportation Needs: Not on file  Physical Activity: Not on file  Stress: Not on file  Social Connections: Not on file  Intimate Partner Violence: Not on file   Current Outpatient Medications on File Prior to Visit  Medication Sig Dispense Refill   acetaminophen (TYLENOL) 650 MG CR tablet Take 1,300 mg by mouth every 8 (eight) hours as needed for pain.     albuterol (VENTOLIN HFA) 108 (90 Base) MCG/ACT inhaler Inhale 2 puffs into the lungs every 4 (four) hours as needed for wheezing or shortness of breath.      Ascorbic Acid (VITAMIN C PO) Take 1 tablet by mouth in the morning.     aspirin EC 81 MG tablet Take 1 tablet (81 mg total) by mouth daily. 30 tablet 0   BELBUCA 300 MCG FILM Take 300 mcg by mouth 2 (two) times daily. 6 each 0   belladonna-opium (B&O SUPPRETTES) 16.2-30 MG suppository Place 1 suppository rectally 2 (two) times daily as needed (for bladder spasms). 10 suppository 0   calcitRIOL  (ROCALTROL) 0.25 MCG capsule TAKE ONE CAPSULE BY MOUTH three times PER WEEK 12 capsule 3   Cholecalciferol (VITAMIN D-3) 1000 units CAPS Take 1,000 Units by mouth daily.     Cyanocobalamin (VITAMIN B-12 PO) Take 1 tablet by mouth daily.     Dulaglutide (TRULICITY) 4.5 TG/6.2IR SOPN Inject 4.5 mg into the skin once a week. 6 mL 3   esomeprazole (NEXIUM) 40 MG capsule Take 1 capsule (40 mg total) by mouth daily before breakfast. 30 capsule 1   famotidine (PEPCID) 40 MG tablet Take 40 mg by mouth daily after supper.     ferrous sulfate 325 (65 FE) MG tablet Take 325 mg by mouth in the morning.     furosemide (LASIX) 20 MG tablet Take 2 tablets (40 mg total) by mouth daily. (Patient taking differently: Take 60 mg by mouth in the morning.) 90 tablet 0   Insulin NPH, Human,, Isophane, (HUMULIN N KWIKPEN) 100 UNIT/ML Kiwkpen Inject 55 Units into the skin every morning. And pen needles 1/day 60 mL 3   Lancets (ONETOUCH DELICA PLUS [id number]) MISC 1 each by Other route 3 (three) times daily. Use to check blood sugar 3 times a day 300 each 11   levothyroxine (SYNTHROID) 200 MCG tablet Take 1 tablet (200 mcg total) by mouth daily before breakfast. 90 tablet 3   linaclotide (LINZESS) 290 MCG CAPS capsule Take 290 mcg by mouth daily before breakfast.     LORazepam (ATIVAN) 1 MG tablet Take 1 tablet (1 mg total) by mouth in the morning, at noon, and at bedtime. 6 tablet 0   lubiprostone (  AMITIZA) 24 MCG capsule Take 24 mcg by mouth 2 (two) times daily.     mirabegron ER (MYRBETRIQ) 25 MG TB24 tablet Take 25 mg by mouth in the morning.     naloxone (NARCAN) nasal spray 4 mg/0.1 mL Place 1 spray into the nose as needed for opioid reversal.     ONETOUCH VERIO test strip TEST BLOOD SUGAR THREE TIMES DAILY 100 strip 11   polyvinyl alcohol (LIQUIFILM TEARS) 1.4 % ophthalmic solution 1 drop as needed for dry eyes.     potassium chloride SA (KLOR-CON) 20 MEQ tablet Take 20 mEq by mouth daily.     pregabalin (LYRICA)  300 MG capsule Take 300 mg by mouth 2 (two) times daily.     rosuvastatin (CRESTOR) 20 MG tablet Take 20 mg by mouth daily.     sucralfate (CARAFATE) 1 g tablet Take 1 g by mouth in the morning.     SURE COMFORT PEN NEEDLES 32G X 6 MM MISC USE TO INJECT INSULIN FOUR TIMES DAILY 200 each 1   tolterodine (DETROL LA) 2 MG 24 hr capsule Take 2 mg by mouth every evening.     valACYclovir (VALTREX) 1000 MG tablet Take 1,000 mg by mouth daily.     venlafaxine XR (EFFEXOR-XR) 150 MG 24 hr capsule Take 150 mg by mouth 2 (two) times daily.     Vitamin D, Ergocalciferol, (DRISDOL) 1.25 MG (50000 UNIT) CAPS capsule Take 1 capsule (50,000 Units total) by mouth every 14 (fourteen) days. 6 capsule 3   ziprasidone (GEODON) 60 MG capsule Take 60 mg by mouth daily after supper.     No current facility-administered medications on file prior to visit.   Allergies  Allergen Reactions   Bee Venom Anaphylaxis   Invokana [Canagliflozin] Anaphylaxis    Dyspnea and urinary retention   Shellfish Allergy Anaphylaxis   Buprenorphine Hcl Other (See Comments)    Overly sedated with morphine drip.   Other Other (See Comments)    Cats - bad asthma, stopped up   Family History  Problem Relation Age of Onset   Heart disease Brother        Multiple MIs, starting in his 17s   Diabetes Brother    Thyroid disease Brother    Hypertension Mother    Diabetes Mother    Breast cancer Mother 42   Bone cancer Mother    Hypertension Sister    Diabetes Sister    Breast cancer Sister 15   Thyroid disease Sister    Breast cancer Maternal Grandmother    Heart disease Maternal Grandmother    Uterine cancer Other 19   Breast cancer Paternal Aunt 28   Breast cancer Paternal Grandmother        dx in her 66s   Prostate cancer Paternal Grandfather    Asthma Son    PE: BP (!) 140/82 (BP Location: Left Wrist, Patient Position: Sitting, Cuff Size: Normal)   Pulse 86   Ht _0  (1.651 m)   SpO2 95%   BMI 58.15 kg/m  Pt in  wheelchair - could not be weighed. Wt Readings from Last 3 Encounters:  09/13/21 (!) 349 lb 6.9 oz (158.5 kg)  03/29/21 (!) 350 lb (158.8 kg)  03/13/21 299 lb 13.2 oz (136 kg)   Constitutional: overweight, in NAD Eyes: no exophthalmos ENT: no thyromegaly, no cervical lymphadenopathy Cardiovascular: RRR, No MRG, + signif.  bilateral LE edema Respiratory: CTA B Musculoskeletal: no deformities Skin: no rashes Neurological: + tremor with  outstretched hands L>R  ASSESSMENT: 1. DM2, insulin-dependent, uncontrolled, with complications - PVD - h/o TIA - CKD - PN  2.  Hyperlipidemia  3.  Postsurgical hypothyroidism  4.  Postsurgical hypoparathyroidism  PLAN:  1. Patient with long-standing, uncontrolled diabetes, on injectable antidiabetic regimen with intermediate acting insulin and weekly GLP-1 receptor agonist, with still poor control.  Latest HbA1c was 9.6% 6 months ago when she saw Dr. Loanne Drilling.  At today's visit, HbA1c is 6.8% (the best she had in a long time). -At today's visit, reviewing her blood sugars at home, they are mostly at goal in the morning, but she is not checking later in the day and I strongly advised her to start.  She does feel when her blood sugars and get into the 70s but she did not feel this recently.  We discussed about continuing on the same regimen for now, especially since she tolerates well the Trulicity, without any GI side effects, but regarding the NPH, I advised her to take it before breakfast.  She is currently taking it either before or after breakfast.  NPH is a twice a day insulin, however, I do not have enough data for now to advise her to take 2 doses a day. - I suggested to:  Patient Instructions  Please continue: - Trulicity 4.5 mg weekly - NPH 45 55 units in am - take this before b'fast  Check some sugars later in the day, rotating the check times.  Continue Levothyroxine 200 mcg daily.  Take the thyroid hormone every day, with water, at  least 30 minutes before breakfast, separated by at least 4 hours from: - acid reflux medications - calcium - iron - multivitamins  Stop Calcitriol.  Come back for labs in ~4 weeks.  Please return in 3-4 months.  - check sugars at different times of the day - check 1-2x a day, rotating checks - discussed about CBG targets for treatment: 80-130 mg/dL before meals and <180 mg/dL after meals; target HbA1c <7%. - given foot care handout  - given instructions for hypoglycemia management "15-15 rule"  - advised for yearly eye exams  - Return to clinic in 3-4 mo   2.  Hyperlipidemia - Reviewed latest lipid panel from 2018: All fractions at goal Lab Results  Component Value Date   CHOL 111 02/29/2016   HDL 55 02/29/2016   LDLCALC 35 02/29/2016   TRIG 104 02/29/2016   CHOLHDL 2.0 02/29/2016  - Continues Crestor 20 mg daily.  She does have muscle aches, unclear if related to Crestor.  3.  Postsurgical hypothyroidism - post total thyroidectomy for benign goiter in 1988 - latest thyroid labs reviewed with pt. >> normal: Lab Results  Component Value Date   TSH 5.13 02/26/2021  - she continues on LT4 200 mcg daily - pt feels good on this dose. - we discussed about taking the thyroid hormone every day, with water, >30 minutes before breakfast, separated by >4 hours from acid reflux medications, calcium, iron, multivitamins. Pt. is taking it correctly. - will check thyroid tests in 1 month when she returns for labs: TSH and fT4 - If labs are abnormal, she will need to return for repeat TFTs in 1.5 months  4. Postsurgical hypoparathyroidism  -Likely resolved -On calcitriol 0.25 mg 3x a week, also, on 1000 units vitamin D daily - last calcium level was normal: Lab Results  Component Value Date   CALCIUM 9.4 09/11/2021   PHOS 3.7 10/05/2020  -no perioral numbness.  She does have muscle aches, unclear if related to Crestor. -At today's visit, we discussed that calcitriol's half-life is  short, so if she can take this 3 times a week with good calcium results, we can likely safely stop it -Plan to check an ionized calcium level along with calcitriol and vitamin D level in 4 weeks.  Orders Placed This Encounter  Procedures   TSH   T4, free   Vitamin D 1,25 dihydroxy   VITAMIN D 25 Hydroxy (Vit-D Deficiency, Fractures)   Calcium, ionized   POCT glycosylated hemoglobin (Hb A1C)   - Total time spent for the visit: 40 min, in precharting, reviewing Dr. Cordelia Pen last note, obtaining medical information from the chart and from the pt, reviewing her  previous labs, evaluations, and treatments, reviewing her symptoms, counseling her about her endocrine conditions (please see the discussed topics above), and developing a plan to further investigate and treat them.  Component     Latest Ref Rng 12/12/2021  TSH     0.35 - 5.50 uIU/mL 1.75   T4,Free(Direct)     0.60 - 1.60 ng/dL 0.89   VITD     30.00 - 100.00 ng/mL 53.82   Calcium Ionized     4.7 - 5.5 mg/dL 4.9   Vitamin D 1, 25 (OH) Total     18 - 72 pg/mL 43   Vitamin D3 1, 25 (OH)     pg/mL 13   Vitamin D2 1, 25 (OH)     pg/mL 30    All labs are normal.  No need to restart calcitriol.  Philemon Kingdom, MD PhD [facility] Endocrinology

## 2021-11-13 NOTE — Patient Instructions (Signed)
Please continue: - Trulicity 4.5 mg weekly - NPH 45 55 units in am - take this before b'fast  Check some sugars later in the day, rotating the check times.  Continue Levothyroxine 200 mcg daily.  Take the thyroid hormone every day, with water, at least 30 minutes before breakfast, separated by at least 4 hours from: - acid reflux medications - calcium - iron - multivitamins  Stop Calcitriol.  Come back for labs in ~4 weeks.  Please return in 3-4 months.  PATIENT INSTRUCTIONS FOR TYPE 2 DIABETES:  **Please join MyChart!** - see attached instructions about how to join if you have not done so already.  DIET AND EXERCISE Diet and exercise is an important part of diabetic treatment.  We recommended aerobic exercise in the form of brisk walking (working between 40-60% of maximal aerobic capacity, similar to brisk walking) for 150 minutes per week (such as 30 minutes five days per week) along with 3 times per week performing 'resistance' training (using various gauge rubber tubes with handles) 5-10 exercises involving the major muscle groups (upper body, lower body and core) performing 10-15 repetitions (or near fatigue) each exercise. Start at half the above goal but build slowly to reach the above goals. If limited by weight, joint pain, or disability, we recommend daily walking in a swimming pool with water up to waist to reduce pressure from joints while allow for adequate exercise.    BLOOD GLUCOSES Monitoring your blood glucoses is important for continued management of your diabetes. Please check your blood glucoses 2-4 times a day: fasting, before meals and at bedtime (you can rotate these measurements - e.g. one day check before the 3 meals, the next day check before 2 of the meals and before bedtime, etc.).   HYPOGLYCEMIA (low blood sugar) Hypoglycemia is usually a reaction to not eating, exercising, or taking too much insulin/ other diabetes drugs.  Symptoms include tremors,  sweating, hunger, confusion, headache, etc. Treat IMMEDIATELY with 15 grams of Carbs: 4 glucose tablets  cup regular juice/soda 2 tablespoons raisins 4 teaspoons sugar 1 tablespoon honey Recheck blood glucose in 15 mins and repeat above if still symptomatic/blood glucose <100.  RECOMMENDATIONS TO REDUCE YOUR RISK OF DIABETIC COMPLICATIONS: * Take your prescribed MEDICATION(S) * Follow a DIABETIC diet: Complex carbs, fiber rich foods, (monounsaturated and polyunsaturated) fats * AVOID saturated/trans fats, high fat foods, >2,300 mg salt per day. * EXERCISE at least 5 times a week for 30 minutes or preferably daily.  * DO NOT SMOKE OR DRINK more than 1 drink a day. * Check your FEET every day. Do not wear tightfitting shoes. Contact us if you develop an ulcer * See your EYE doctor once a year or more if needed * Get a FLU shot once a year * Get a PNEUMONIA vaccine once before and once after age 36 years  GOALS:  * Your Hemoglobin A1c of <7%  * fasting sugars need to be 80-130 * after meals sugars need to be <180 (2h after you start eating) * Your Systolic BP should be 469 or lower  * Your Diastolic BP should be 80 or lower  * Your HDL (Good Cholesterol) should be 40 or higher  * Your LDL (Bad Cholesterol) should be ideally <70. * Your Triglycerides should be 150 or lower  * Your Urine microalbumin (kidney function) should be <30 * Your Body Mass Index should be 25 or lower   Please consider the following ways to cut down carbs and  fat and increase fiber and micronutrients in your diet: - substitute whole grain for white bread or pasta - substitute brown rice for white rice - substitute 90-calorie flat bread pieces for slices of bread when possible - substitute sweet potatoes or yams for white potatoes - substitute humus for margarine - substitute tofu for cheese when possible - substitute almond or rice milk for regular milk (would not drink soy milk daily due to concern for soy  estrogen influence on breast cancer risk) - substitute dark chocolate for other sweets when possible - substitute water - can add lemon or orange slices for taste - for diet sodas (artificial sweeteners will trick your body that you can eat sweets without getting calories and will lead you to overeating and weight gain in the long run) - do not skip breakfast or other meals (this will slow down the metabolism and will result in more weight gain over time)  - can try smoothies made from fruit and almond/rice milk in am instead of regular breakfast - can also try old-fashioned (not instant) oatmeal made with almond/rice milk in am - order the dressing on the side when eating salad at a restaurant (pour less than half of the dressing on the salad) - eat as little meat as possible - can try juicing, but should not forget that juicing will get rid of the fiber, so would alternate with eating raw veg./fruits or drinking smoothies - use as little oil as possible, even when using olive oil - can dress a salad with a mix of balsamic vinegar and lemon juice, for e.g. - use agave nectar, stevia sugar, or regular sugar rather than artificial sweateners - steam or broil/roast veggies  - snack on veggies/fruit/nuts (unsalted, preferably) when possible, rather than processed foods - reduce or eliminate aspartame in diet (it is in diet sodas, chewing gum, etc) Read the labels!  Try to read Dr. Janene Harvey book: "Program for Reversing Diabetes" for other ideas for healthy eating.

## 2021-12-12 ENCOUNTER — Other Ambulatory Visit (INDEPENDENT_AMBULATORY_CARE_PROVIDER_SITE_OTHER): Payer: Medicare Other

## 2021-12-12 DIAGNOSIS — E890 Postprocedural hypothyroidism: Secondary | ICD-10-CM

## 2021-12-12 LAB — VITAMIN D 25 HYDROXY (VIT D DEFICIENCY, FRACTURES): VITD: 53.82 ng/mL (ref 30.00–100.00)

## 2021-12-12 LAB — T4, FREE: Free T4: 0.89 ng/dL (ref 0.60–1.60)

## 2021-12-12 LAB — TSH: TSH: 1.75 u[IU]/mL (ref 0.35–5.50)

## 2021-12-16 LAB — VITAMIN D 1,25 DIHYDROXY
Vitamin D 1, 25 (OH)2 Total: 43 pg/mL (ref 18–72)
Vitamin D2 1, 25 (OH)2: 30 pg/mL
Vitamin D3 1, 25 (OH)2: 13 pg/mL

## 2021-12-16 LAB — CALCIUM, IONIZED: Calcium, Ion: 4.9 mg/dL (ref 4.7–5.5)

## 2022-01-17 ENCOUNTER — Other Ambulatory Visit: Payer: Self-pay | Admitting: Endocrinology

## 2022-01-17 DIAGNOSIS — N2581 Secondary hyperparathyroidism of renal origin: Secondary | ICD-10-CM

## 2022-01-23 ENCOUNTER — Other Ambulatory Visit: Payer: Self-pay

## 2022-01-23 DIAGNOSIS — E890 Postprocedural hypothyroidism: Secondary | ICD-10-CM

## 2022-01-23 MED ORDER — LEVOTHYROXINE SODIUM 200 MCG PO TABS: ORAL_TABLET | ORAL | 0 refills | Status: DC

## 2022-01-27 ENCOUNTER — Other Ambulatory Visit: Payer: Self-pay | Admitting: Internal Medicine

## 2022-01-27 DIAGNOSIS — E2089 Other specified hypoparathyroidism: Secondary | ICD-10-CM

## 2022-01-27 DIAGNOSIS — E209 Hypoparathyroidism, unspecified: Secondary | ICD-10-CM | POA: Insufficient documentation

## 2022-01-28 ENCOUNTER — Other Ambulatory Visit: Payer: Medicare Other

## 2022-02-12 ENCOUNTER — Other Ambulatory Visit: Payer: Medicare Other

## 2022-02-26 ENCOUNTER — Other Ambulatory Visit: Payer: Medicare Other

## 2022-03-12 ENCOUNTER — Telehealth: Payer: Self-pay

## 2022-03-12 ENCOUNTER — Other Ambulatory Visit: Payer: Medicare Other

## 2022-03-12 DIAGNOSIS — Z794 Long term (current) use of insulin: Secondary | ICD-10-CM

## 2022-03-12 DIAGNOSIS — E119 Type 2 diabetes mellitus without complications: Secondary | ICD-10-CM

## 2022-03-12 MED ORDER — ONETOUCH VERIO VI STRP: ORAL_STRIP | 11 refills | Status: DC

## 2022-03-12 NOTE — Telephone Encounter (Signed)
No note needed 

## 2022-03-17 ENCOUNTER — Ambulatory Visit: Payer: Medicare Other | Admitting: Internal Medicine

## 2022-03-25 ENCOUNTER — Other Ambulatory Visit (HOSPITAL_COMMUNITY): Payer: Self-pay | Admitting: Gastroenterology

## 2022-03-25 ENCOUNTER — Encounter (HOSPITAL_COMMUNITY): Payer: Self-pay | Admitting: Gastroenterology

## 2022-03-25 ENCOUNTER — Other Ambulatory Visit: Payer: Medicare Other

## 2022-03-25 DIAGNOSIS — R1084 Generalized abdominal pain: Secondary | ICD-10-CM

## 2022-03-27 IMAGING — DX DG CHEST 1V PORT
1 series · 1 of 1 positions shown · non-contrast
Comparison: September 29, 2020

CLINICAL DATA: Central line placement

EXAM:
PORTABLE CHEST 1 VIEW

[chest]
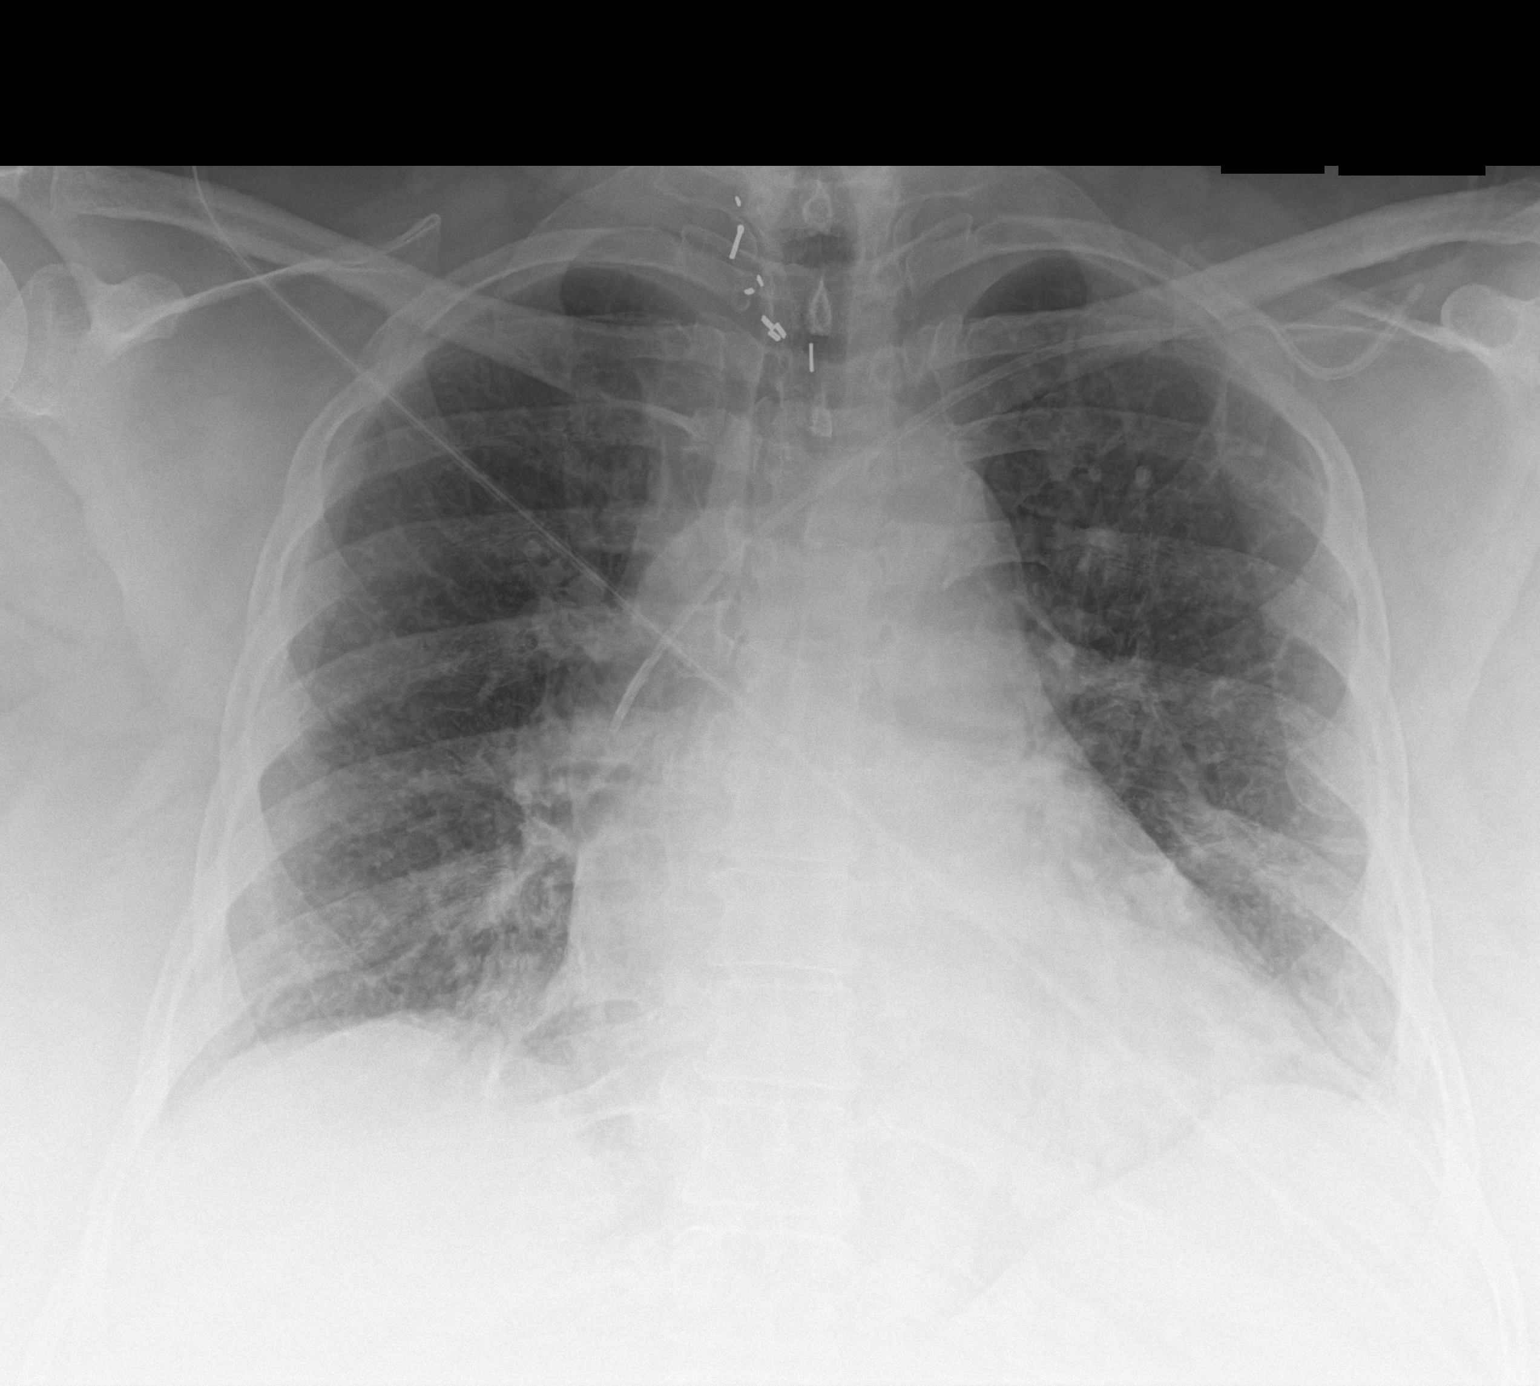

[1 of 1 positions shown; findings below may reference images not displayed]

FINDINGS: Stable cardiomegaly. The hila and mediastinum are unremarkable. A
new left central line terminates in the central SVC. No
pneumothorax. Suggested mild pulmonary edema, unchanged. Patchy
bibasilar opacities may represent atelectasis or infiltrates.
IMPRESSION: Cardiomegaly and probable mild edema.

Patchy opacity in the left greater than right bases could represent
atelectasis or infiltrate. Recommend attention on follow-up.

The new left central line is in good position terminating in the SVC
with no pneumothorax.

## 2022-03-27 IMAGING — CT CT HEAD W/O CM
4 series · 16 of 47 positions shown, 18 images · non-contrast
Comparison: Head CT 04/23/2016

CLINICAL DATA: Delirium.  Found unresponsive by home health aide.

EXAM:
CT HEAD WITHOUT CONTRAST
TECHNIQUE: Contiguous axial images were obtained from the base of the skull
through the vertex without intravenous contrast.

[Series 3: head without · axial · non-contrast · 0.41mm/px · z∈[-4,+116]mm · 7 of 33 slices shown, 9 images]
[im 5/33  brain]
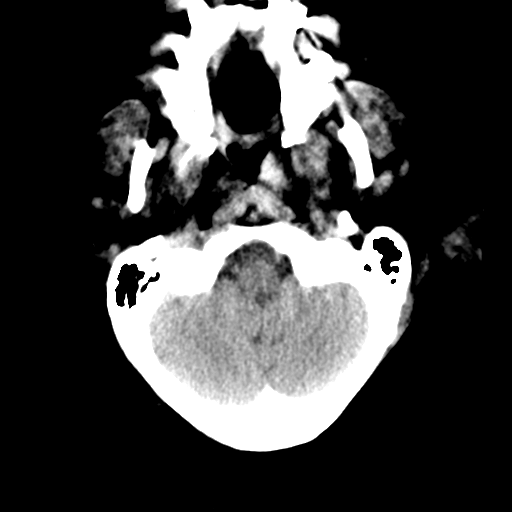
[im 5/33  bone]
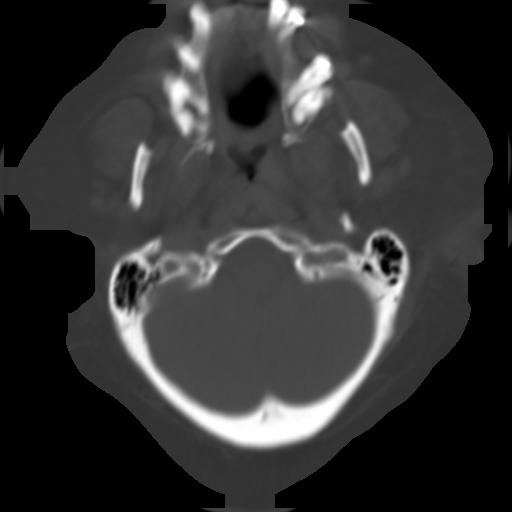
[im 9/33  brain]
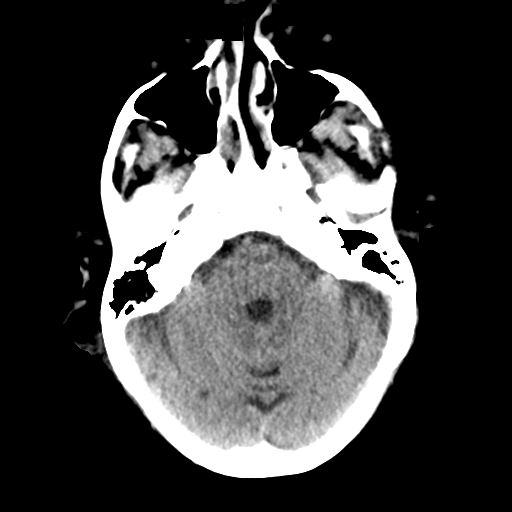
[im 13/33  brain]
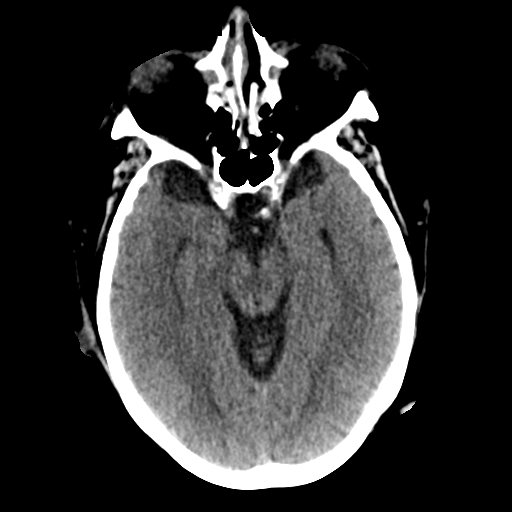
[im 17/33  brain]
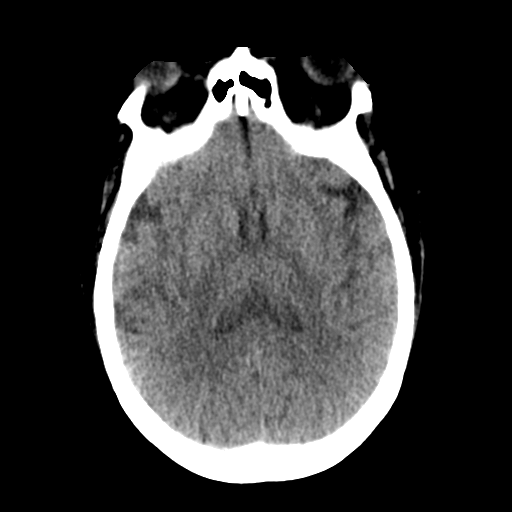
[im 21/33  brain]
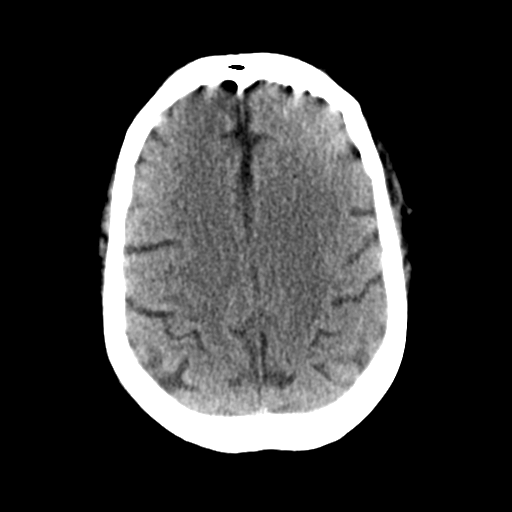
[im 21/33  bone]
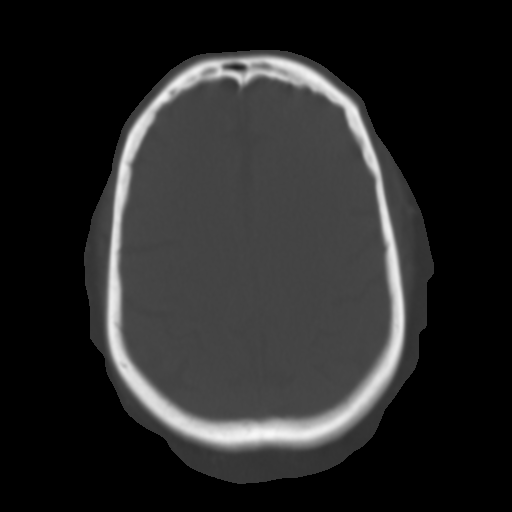
[im 25/33  brain]
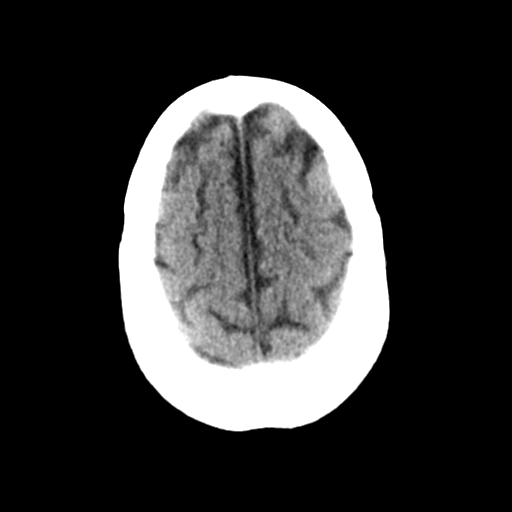
[im 29/33  brain]
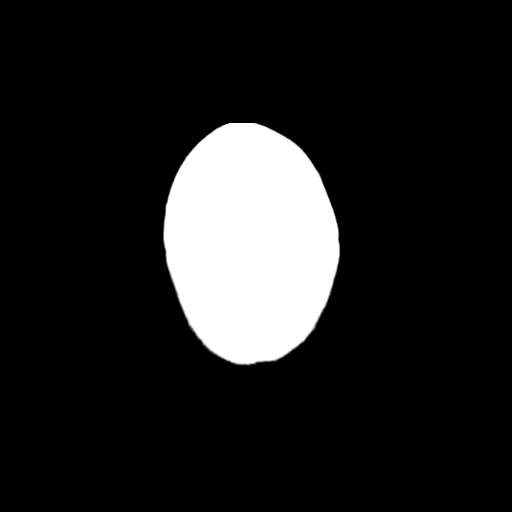

[Series 4: head bone · axial · 0.41mm/px · z∈[-8,+24]mm · 3 of 82 slices shown]
[im 9/82  bone]
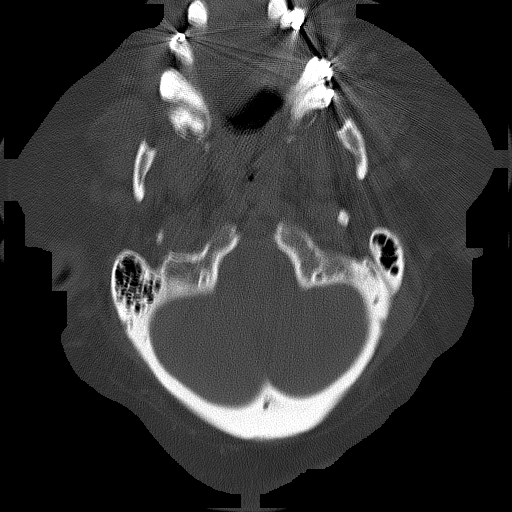
[im 17/82  bone]
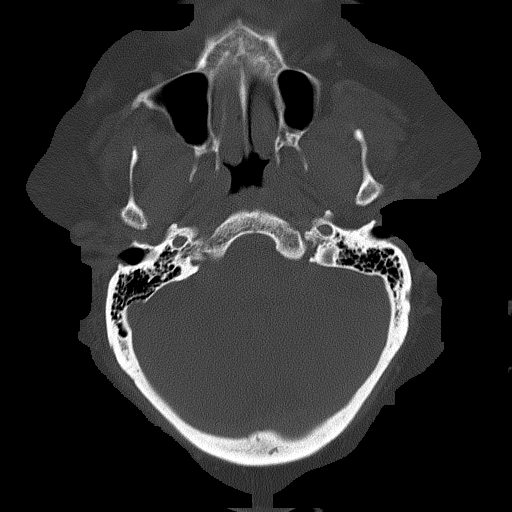
[im 25/82  bone]
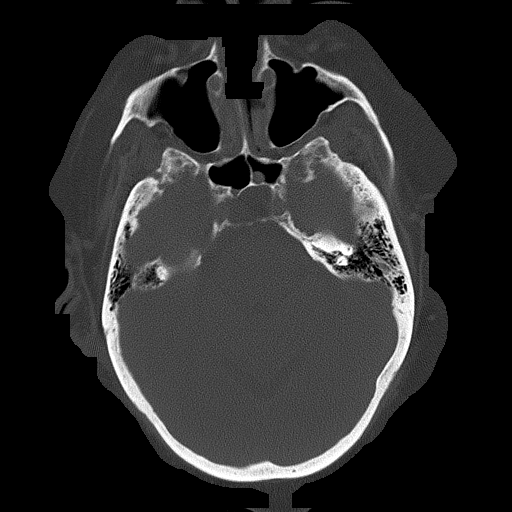

[Series 5: head without cor · coronal · non-contrast · 0.30mm/px · 3 of 71 slices shown]
[im 26/71  brain]
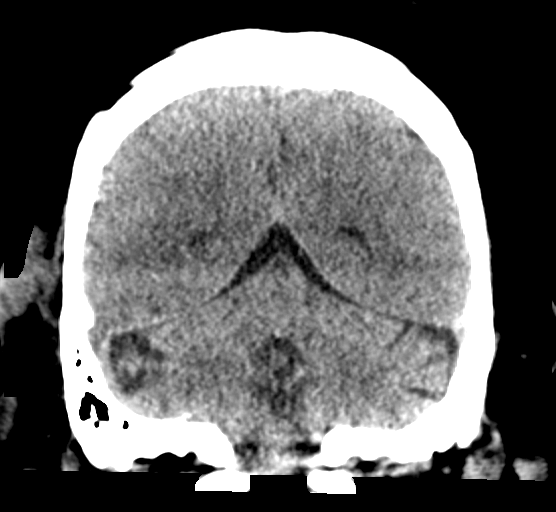
[im 32/71  brain]
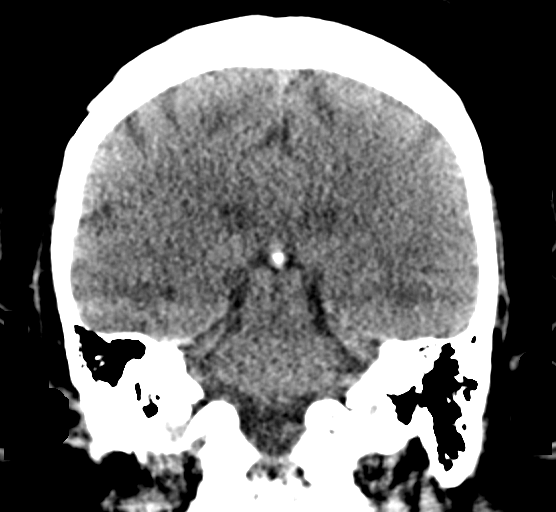
[im 39/71  brain]
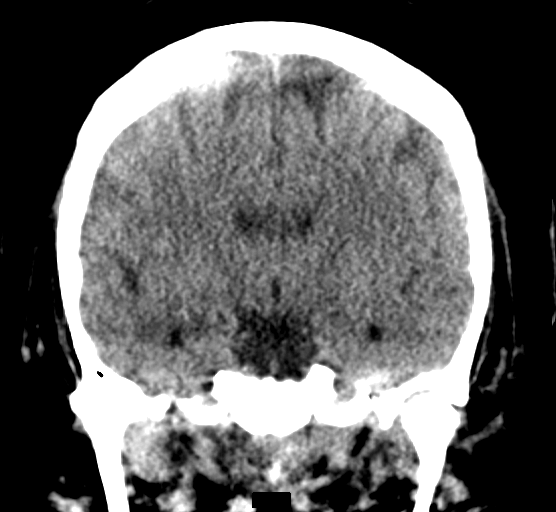

[Series 6: head without sag · sagittal · non-contrast · 0.28mm/px · 3 of 57 slices shown]
[im 19/57  brain]
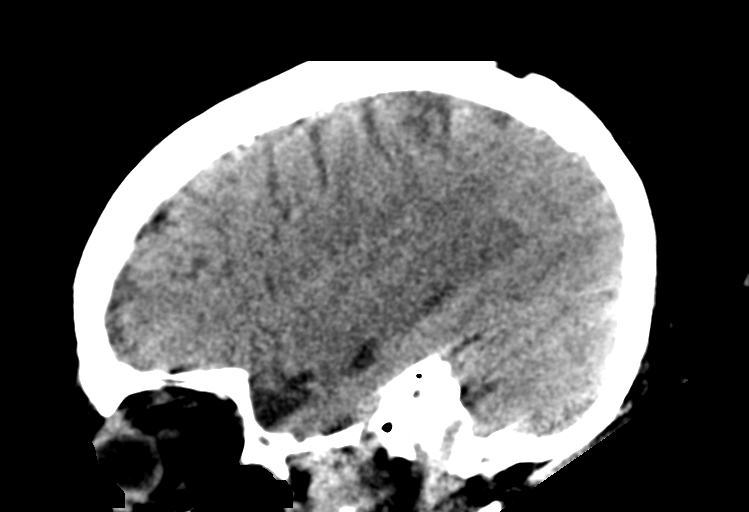
[im 29/57  brain]
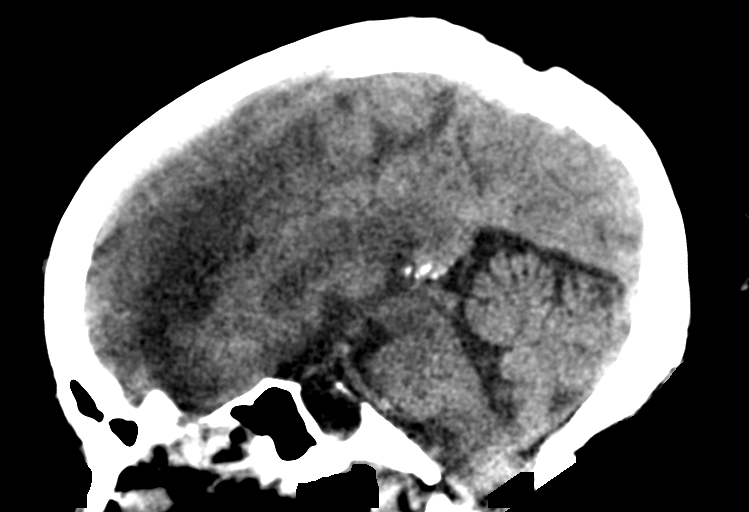
[im 38/57  brain]
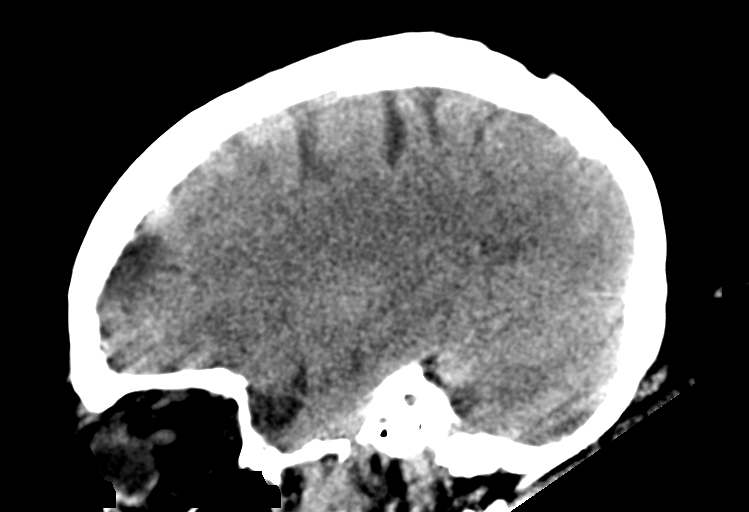

[16 of 47 positions shown; findings below may reference images not displayed]

FINDINGS: Brain: Mild motion artifact limitations. Allowing for this, no
intracranial hemorrhage. No mass effect or midline shift. No
hydrocephalus. The basilar cisterns are patent. No evidence of
territorial infarct or acute ischemia. Partially empty sella,
chronic and unchanged from prior. No extra-axial or intracranial
fluid collection.

Vascular: No hyperdense vessel.

Skull: No fracture or focal lesion.

Sinuses/Orbits: Mucosal thickening throughout ethmoid air cells. No
sinus fluid levels. Chronic bilateral proptosis. No acute orbital
findings. No mastoid effusion.

Other: 11 mm soft tissue nodularity of the left occipital scalp
appears similar to prior imaging.
IMPRESSION: No acute intracranial abnormality, allowing for mild motion artifact
limitations.

## 2022-03-27 IMAGING — DX DG CHEST 1V PORT
1 series · 1 of 1 positions shown · non-contrast
Comparison: 04/20/2019

CLINICAL DATA: Altered mental status

EXAM:
PORTABLE CHEST 1 VIEW

[chest ap]
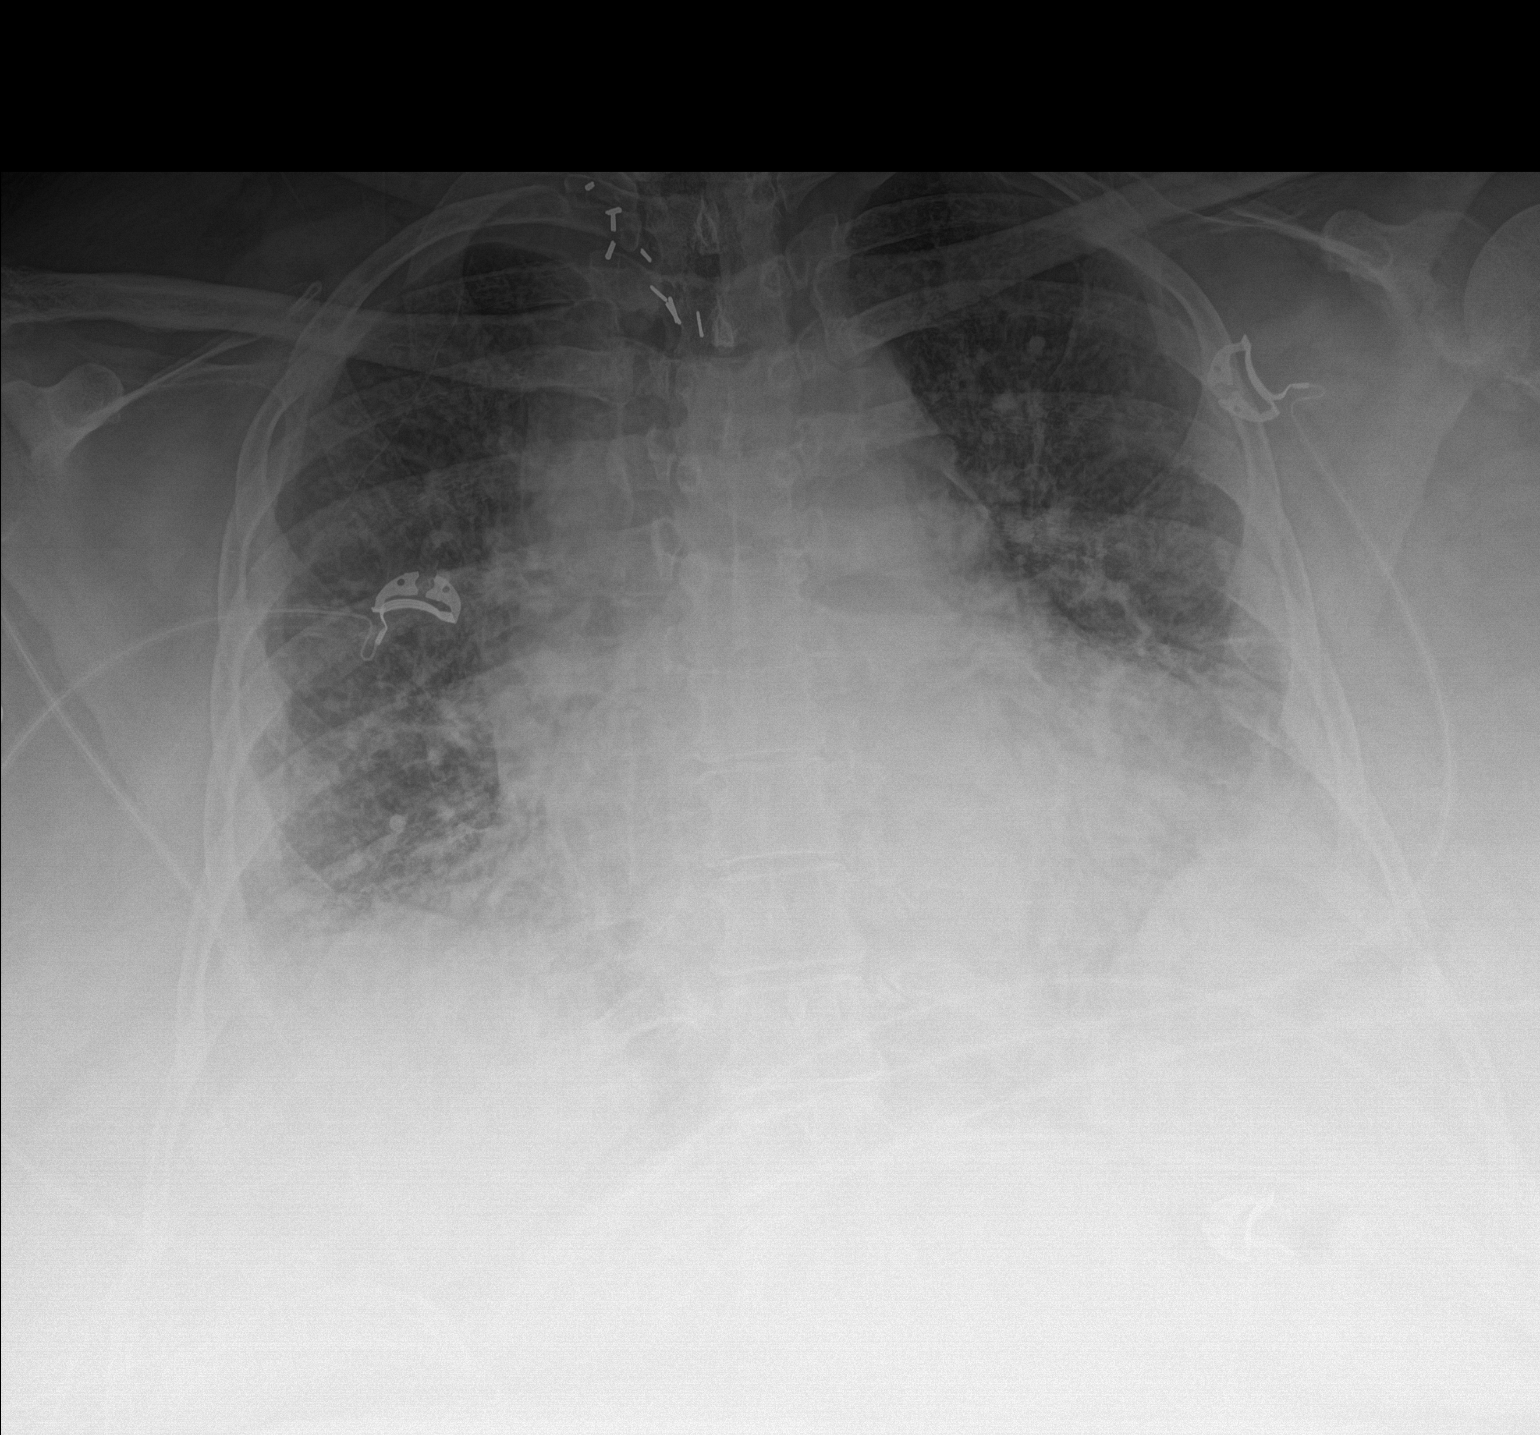

[1 of 1 positions shown; findings below may reference images not displayed]

FINDINGS: Cardiomegaly. Low lung volumes. Patchy bibasilar opacities. Diffuse
interstitial prominence with probable small bilateral pleural
effusions. No pneumothorax.
IMPRESSION: Cardiomegaly with suggestion of mild pulmonary edema. Patchy
bibasilar opacities may represent atelectasis, edema, or pneumonia.

## 2022-03-27 IMAGING — CT CT CHEST W/O CM
2 of 4 series · 14 of 36 positions shown, 17 images · non-contrast
Comparison: Chest x-ray from earlier in the same day, CT from
02/14/2016.

CLINICAL DATA: Persistent cough and recently unresponsive, possible
oxycodone overdose

EXAM:
CT CHEST WITHOUT CONTRAST
TECHNIQUE: Multidetector CT imaging of the chest was performed following the
standard protocol without IV contrast.

[Series 3: chest w/o 2mm st · axial · non-contrast · 0.98mm/px · z∈[-377,-127]mm · 11 of 149 slices shown, 14 images]
[im 12/149  mediastinal]
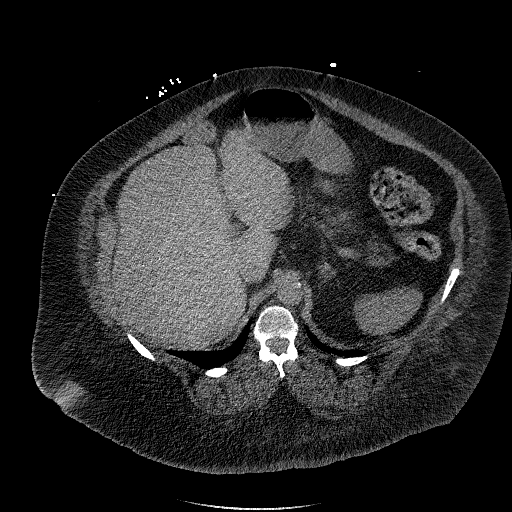
[im 12/149  lung]
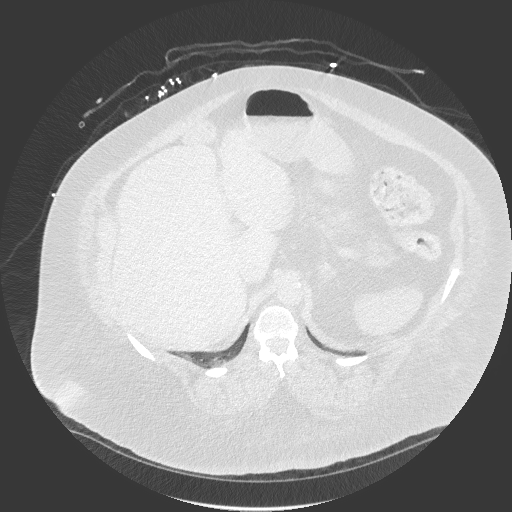
[im 23/149  lung]
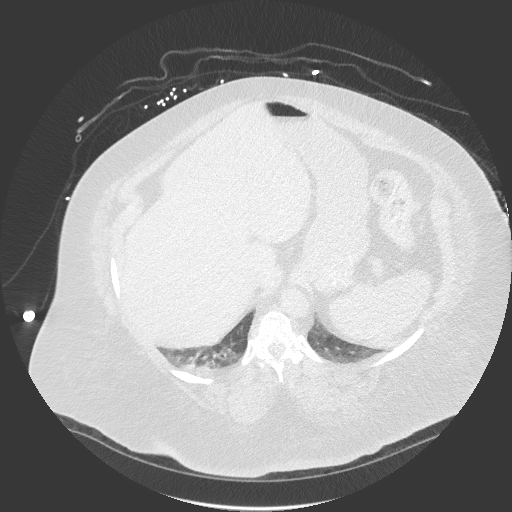
[im 35/149  lung]
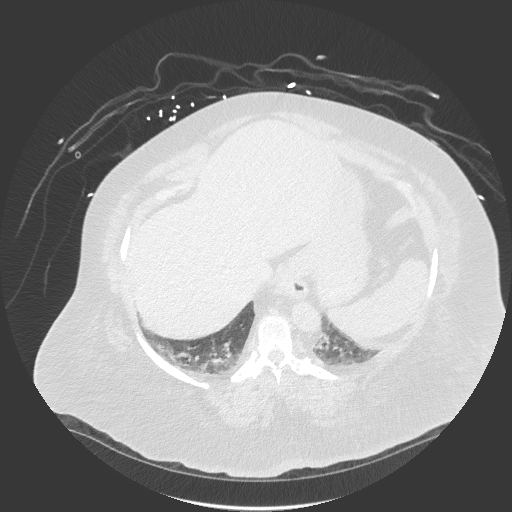
[im 46/149  lung]
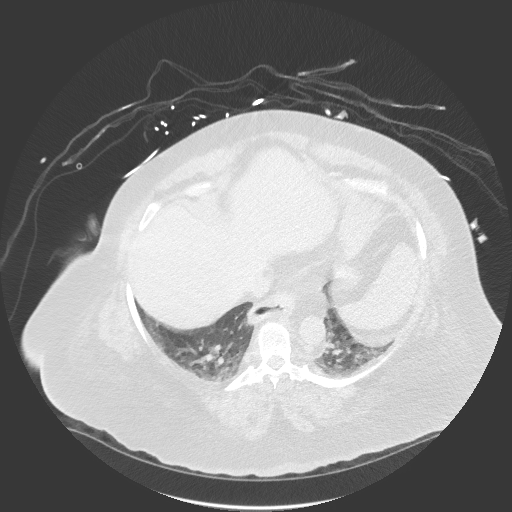
[im 57/149  mediastinal]
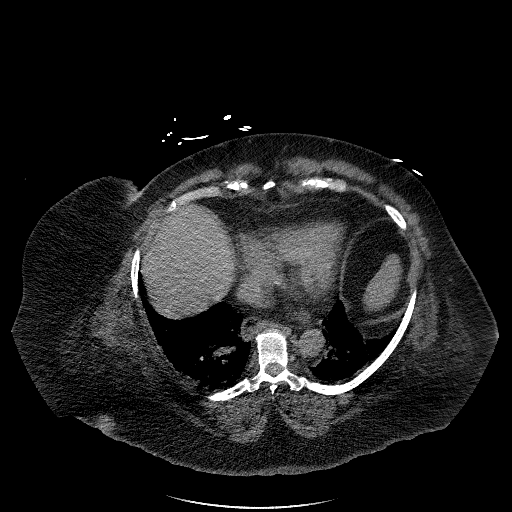
[im 57/149  lung]
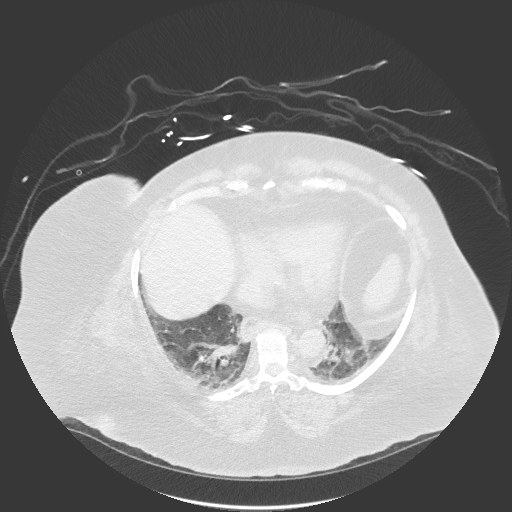
[im 80/149  lung]
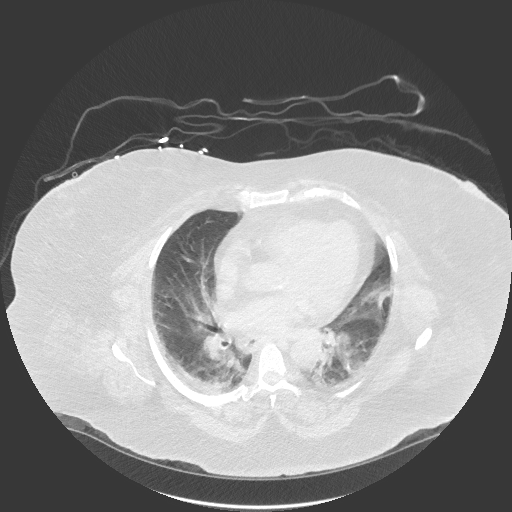
[im 92/149  lung]
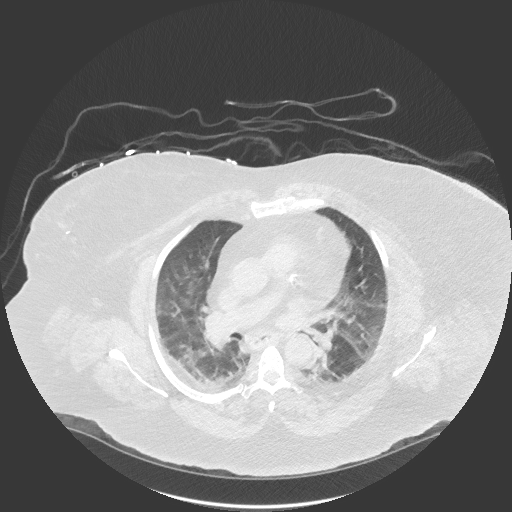
[im 103/149  lung]
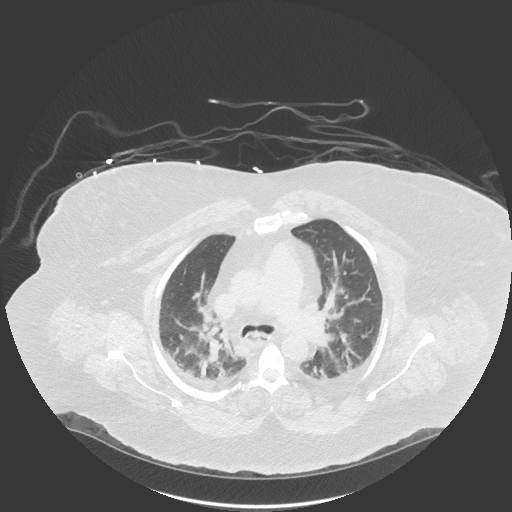
[im 114/149  mediastinal]
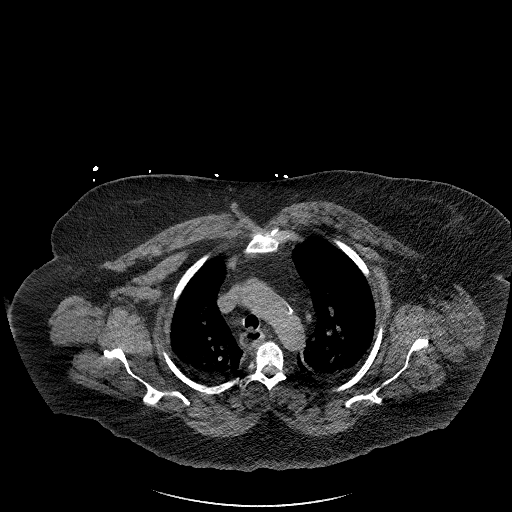
[im 114/149  lung]
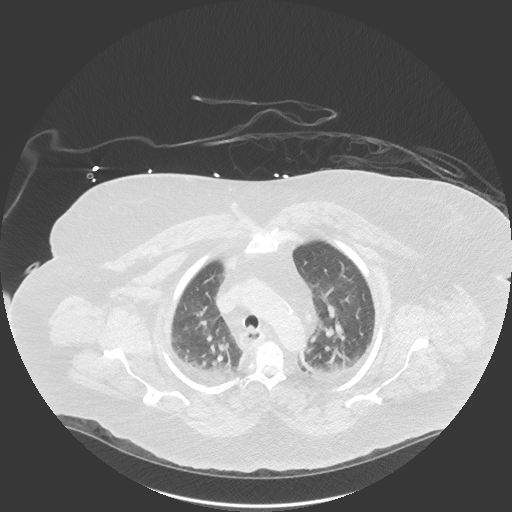
[im 126/149  lung]
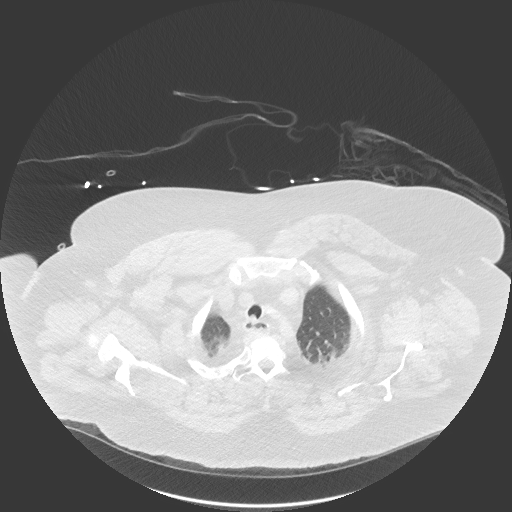
[im 137/149  lung]
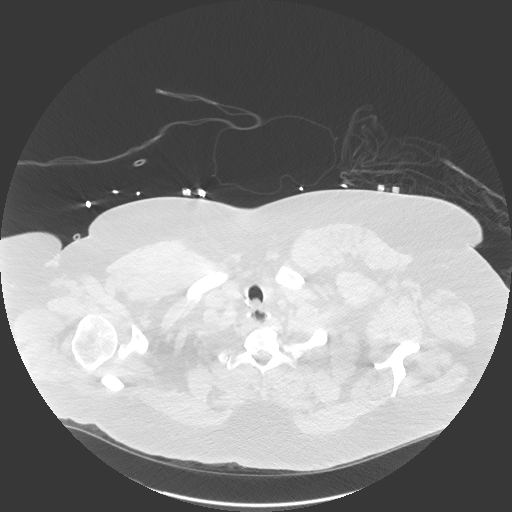

[Series 6: chest w/o 2mm st cor · coronal · non-contrast · 0.59mm/px · 3 of 216 slices shown]
[im 44/216  lung]
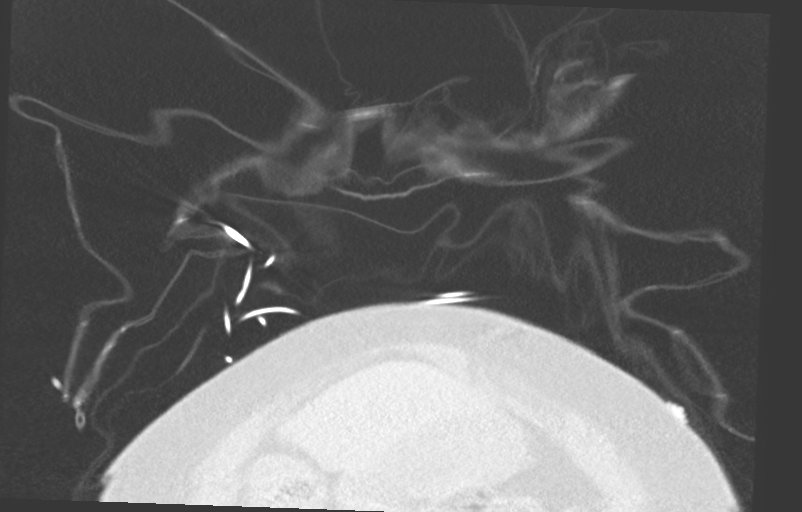
[im 87/216  lung]
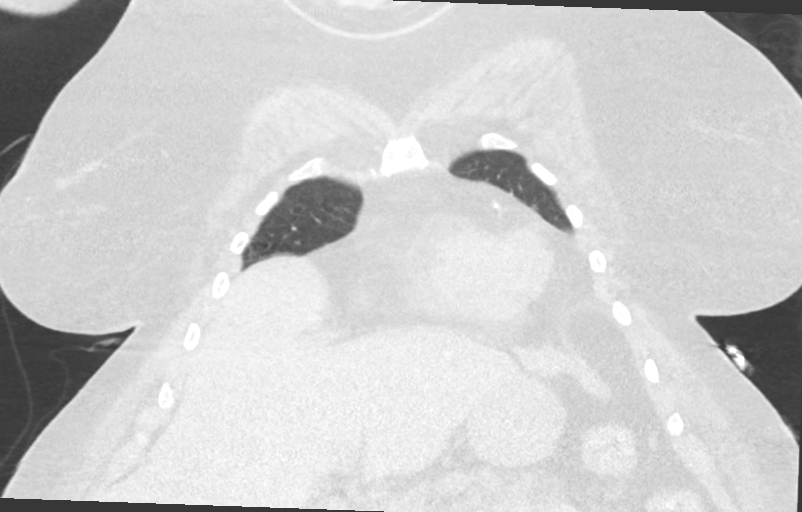
[im 130/216  lung]
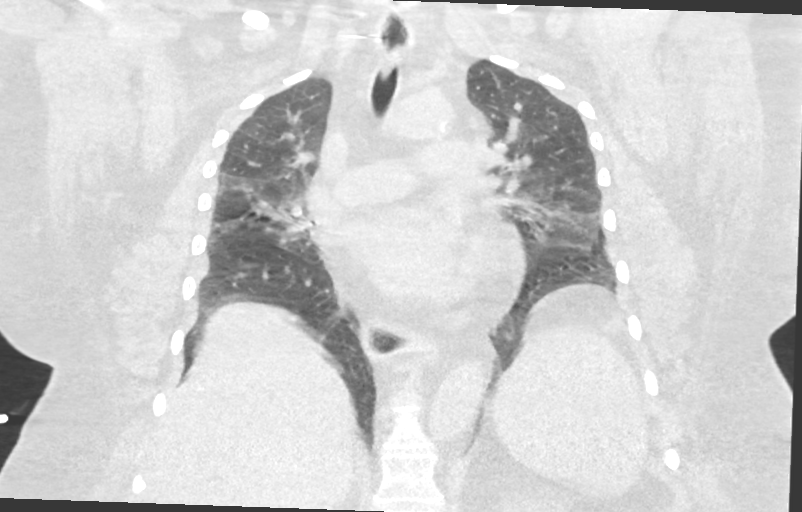

[14 of 36 positions shown; findings below may reference images not displayed]

FINDINGS: Cardiovascular: Atherosclerotic calcifications of the thoracic aorta
are noted. Coronary calcifications are seen. No aneurysmal
dilatation is seen. No cardiac enlargement is noted. Pulmonary
artery is not significantly enlarged.

Mediastinum/Nodes: Postsurgical changes are noted in the thoracic
inlet consistent with prior thyroidectomy. Tiny mediastinal lymph
nodes are noted. No sizable hilar or mediastinal adenopathy is
noted. Small sliding-type hiatal hernia is noted. The esophagus is
otherwise within normal limits.

Lungs/Pleura: Lungs are well aerated bilaterally with the exception
of mild bibasilar atelectasis. No focal confluent infiltrate is
seen. No sizable parenchymal nodule is noted. Changes of mild
pulmonary edema are noted.

Upper Abdomen: Visualized upper abdomen appears within normal
limits.

Musculoskeletal: Mild degenerative changes of the thoracic spine are
seen. No acute bony abnormality is noted.
IMPRESSION: Changes of mild pulmonary edema.

Small sliding-type hiatal hernia.

No other significant abnormality is seen.

Aortic Atherosclerosis ([id number].O).

## 2022-04-04 ENCOUNTER — Other Ambulatory Visit: Payer: Self-pay | Admitting: Endocrinology

## 2022-04-04 ENCOUNTER — Ambulatory Visit
Admission: RE | Admit: 2022-04-04 | Discharge: 2022-04-04 | Disposition: A | Discharge status: Home / Self Care | Payer: 59 | Source: Ambulatory Visit | Attending: Endocrinology | Admitting: Endocrinology

## 2022-04-04 ENCOUNTER — Ambulatory Visit
Admission: RE | Admit: 2022-04-04 | Discharge: 2022-04-04 | Disposition: A | Discharge status: Home / Self Care | Payer: Medicare Other | Source: Ambulatory Visit | Attending: Endocrinology | Admitting: Endocrinology

## 2022-04-04 DIAGNOSIS — N6320 Unspecified lump in the left breast, unspecified quadrant: Secondary | ICD-10-CM

## 2022-04-07 ENCOUNTER — Other Ambulatory Visit: Payer: Self-pay

## 2022-04-07 ENCOUNTER — Emergency Department (HOSPITAL_COMMUNITY)
Admission: EM | Admit: 2022-04-07 | Discharge: 2022-04-08 | Disposition: A | Discharge status: Home / Self Care | Payer: 59 | Attending: Emergency Medicine | Admitting: Emergency Medicine

## 2022-04-07 ENCOUNTER — Emergency Department (HOSPITAL_COMMUNITY): Payer: 59

## 2022-04-07 ENCOUNTER — Encounter (HOSPITAL_COMMUNITY): Payer: Self-pay

## 2022-04-07 DIAGNOSIS — I129 Hypertensive chronic kidney disease with stage 1 through stage 4 chronic kidney disease, or unspecified chronic kidney disease: Secondary | ICD-10-CM | POA: Insufficient documentation

## 2022-04-07 DIAGNOSIS — Z794 Long term (current) use of insulin: Secondary | ICD-10-CM | POA: Insufficient documentation

## 2022-04-07 DIAGNOSIS — Z853 Personal history of malignant neoplasm of breast: Secondary | ICD-10-CM | POA: Diagnosis not present

## 2022-04-07 DIAGNOSIS — N183 Chronic kidney disease, stage 3 unspecified: Secondary | ICD-10-CM | POA: Insufficient documentation

## 2022-04-07 DIAGNOSIS — Z79899 Other long term (current) drug therapy: Secondary | ICD-10-CM | POA: Diagnosis not present

## 2022-04-07 DIAGNOSIS — L03317 Cellulitis of buttock: Secondary | ICD-10-CM | POA: Insufficient documentation

## 2022-04-07 DIAGNOSIS — M25561 Pain in right knee: Secondary | ICD-10-CM | POA: Insufficient documentation

## 2022-04-07 DIAGNOSIS — E039 Hypothyroidism, unspecified: Secondary | ICD-10-CM | POA: Diagnosis not present

## 2022-04-07 DIAGNOSIS — W19XXXA Unspecified fall, initial encounter: Secondary | ICD-10-CM

## 2022-04-07 DIAGNOSIS — M545 Low back pain, unspecified: Secondary | ICD-10-CM | POA: Diagnosis not present

## 2022-04-07 DIAGNOSIS — J45909 Unspecified asthma, uncomplicated: Secondary | ICD-10-CM | POA: Diagnosis not present

## 2022-04-07 DIAGNOSIS — W050XXA Fall from non-moving wheelchair, initial encounter: Secondary | ICD-10-CM | POA: Diagnosis not present

## 2022-04-07 DIAGNOSIS — Z7982 Long term (current) use of aspirin: Secondary | ICD-10-CM | POA: Diagnosis not present

## 2022-04-07 MED ORDER — CEPHALEXIN 500 MG PO CAPS: 500.0000 mg | ORAL_CAPSULE | Freq: Two times a day (BID) | ORAL | 0 refills | Status: DC

## 2022-04-07 MED ORDER — KETOROLAC TROMETHAMINE 15 MG/ML IJ SOLN
15.0000 mg | Freq: Once | INTRAMUSCULAR | Status: AC
  Administered 2022-04-07: 15 mg via INTRAMUSCULAR
  Filled 2022-04-07: qty 1

## 2022-04-07 NOTE — ED Notes (Signed)
Pt gone to Xray 

## 2022-04-07 NOTE — ED Notes (Signed)
[facility] has been called.Marland KitchenMarland Kitchenpt waiting on [facility]AR

## 2022-04-07 NOTE — ED Triage Notes (Signed)
Pt BIB [facility] from home. Pt was trying to transfer from the wheelchair to the bed and ended up falling down. Pt has a hx of falls. Pt heard R knee pop when she fell.   EMS VS BP 129/71 P 84 O2 97% RA CBG  109

## 2022-04-07 NOTE — ED Provider Notes (Signed)
[facility] Provider Note   CSN: [id number] Arrival date & time: 04/07/22  1754     History  Chief Complaint  Patient presents with   Lytle Michaels    [name] is a 67 y.o. female.   Fall  Patient presents after fall.  States that she was trying to transfer from her wheelchair into her bed.  States she landed on her right knee and felt a pop.  Also pain in the low back.  Does have chronic pain including in the back.  States her knee does not normally currently this. Did head.  No other injury.    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 Authorizing Provider  cephALEXin (KEFLEX) 500 MG capsule Take 1 capsule (500 mg total) by mouth 2 (two) times daily. 04/07/22  Yes Davonna Belling, MD  acetaminophen (TYLENOL) 650 MG CR tablet Take 1,300 mg by mouth every 8 (eight) hours as needed for pain.    [provider]  albuterol (VENTOLIN HFA) 108 (90 Base) MCG/ACT inhaler Inhale 2 puffs into the lungs every 4 (four) hours as needed for wheezing or  shortness of breath.     [provider]  Ascorbic Acid (VITAMIN C PO) Take 1 tablet by mouth in the morning.    [provider]  aspirin EC 81 MG tablet Take 1 tablet (81 mg total) by mouth daily. 08/12/13   Viyuoh, Alison Stalling, MD  BELBUCA 300 MCG FILM Take 300 mcg by mouth 2 (two) times daily. 09/13/21   Danford, Suann Larry, MD  belladonna-opium (B&O SUPPRETTES) 16.2-30 MG suppository Place 1 suppository rectally 2 (two) times daily as needed (for bladder spasms). 11/14/20   Molpus, John, MD  Cholecalciferol (VITAMIN D-3) 1000 units CAPS Take 1,000 Units by mouth daily.    [provider]  Cyanocobalamin (VITAMIN B-12 PO) Take 1 tablet by mouth daily.    [provider]  Dulaglutide (TRULICITY) 4.5 0000000 SOPN Inject 4.5 mg into the skin once a week. 10/17/21   Philemon Kingdom, MD  esomeprazole (NEXIUM) 40 MG capsule Take 1 capsule (40 mg total) by mouth daily before breakfast. 10/30/14   Barton Dubois, MD  famotidine (PEPCID) 40 MG tablet Take 40 mg by mouth daily after supper. 06/16/20   [provider]  ferrous sulfate 325 (65 FE) MG tablet Take 325 mg by mouth in the morning.  [provider]  furosemide (LASIX) 20 MG tablet Take 2 tablets (40 mg total) by mouth daily. Patient taking differently: Take 60 mg by mouth in the morning. 09/09/19   Renato Shin, MD  glucose blood (ONETOUCH VERIO) test strip TEST BLOOD SUGAR THREE TIMES DAILY 03/12/22   Philemon Kingdom, MD  Insulin NPH, Human,, Isophane, (HUMULIN N KWIKPEN) 100 UNIT/ML Kiwkpen Inject 55 Units into the skin every morning. And pen needles 1/day 04/26/21   Renato Shin, MD  Lancets [facility] DELICA PLUS [id number]) [place] 1 each by Other route 3 (three) times daily. Use to check blood sugar 3 times a day 05/13/21   Renato Shin, MD  levothyroxine (SYNTHROID) 200 MCG tablet Take 1 tablet (200 mcg total) by mouth daily before breakfast. 01/23/22   Philemon Kingdom, MD  linaclotide  (LINZESS) 290 MCG CAPS capsule Take 290 mcg by mouth daily before breakfast.    [provider]  LORazepam (ATIVAN) 1 MG tablet Take 1 tablet (1 mg total) by mouth in the morning, at noon, and at bedtime. 09/13/21   Danford, Suann Larry, MD  lubiprostone (AMITIZA) 24 MCG capsule Take 24 mcg by mouth 2 (two) times daily. 06/22/20   [provider]  mirabegron ER (MYRBETRIQ) 25 MG TB24 tablet Take 25 mg by mouth in the morning.    [provider]  naloxone [facility]) nasal spray 4 mg/0.1 mL Place 1 spray into the nose as needed for opioid reversal. 08/01/20   [provider]  polyvinyl alcohol (LIQUIFILM TEARS) 1.4 % ophthalmic solution 1 drop as needed for dry eyes.    [provider]  potassium chloride SA (KLOR-CON) 20 MEQ tablet Take 20 mEq by mouth daily. 06/22/20   [provider]  pregabalin (LYRICA) 300 MG capsule Take 300 mg by mouth 2 (two) times daily.    [provider]  rosuvastatin (CRESTOR) 20 MG tablet Take 20 mg by mouth daily.    [provider]  sucralfate (CARAFATE) 1 g tablet Take 1 g by mouth in the morning. 08/13/20   [provider]  SURE COMFORT PEN NEEDLES 32G X 6 MM MISC USE TO INJECT INSULIN FOUR TIMES DAILY 03/13/21   Renato Shin, MD  tolterodine (DETROL LA) 2 MG 24 hr capsule Take 2 mg by mouth every evening. 09/24/20   [provider]  valACYclovir (VALTREX) 1000 MG tablet Take 1,000 mg by mouth daily.    [provider]  venlafaxine XR (EFFEXOR-XR) 150 MG 24 hr capsule Take 150 mg by mouth 2 (two) times daily. 05/21/15   [provider]  ziprasidone (GEODON) 60 MG capsule Take 60 mg by mouth daily after supper.    [provider]      Allergies    Bee venom, Invokana [canagliflozin], Shellfish allergy, Buprenorphine hcl, and Other    Review of Systems   Review of Systems  Physical Exam Updated Vital Signs BP (!) 112/59 (BP Location: Left Arm)   Pulse 81    Temp 98 F (36.7 C)   Resp 18   Ht 5' 5"$  (1.651 m)   Wt (!) 158.8 kg   SpO2 98%   BMI 58.24 kg/m  Physical Exam Vitals reviewed.  Constitutional:      Appearance: She is obese.  Cardiovascular:     Rate and Rhythm: Regular rhythm.  Chest:     Chest wall: No tenderness.  Abdominal:     Tenderness: There is no abdominal tenderness.  Musculoskeletal:  General: Tenderness present.     Cervical back: Neck supple.     Comments: Tenderness to right knee proximally.  No tenderness over ankle or hip.  Does have potentially some lumbar tenderness but somewhat difficult examination due to body habitus.  Neurological:     Mental Status: She is alert and oriented to person, place, and time.     ED Results / Procedures / Treatments   Labs (all labs ordered are listed, but only abnormal results are displayed) Labs Reviewed - No data to display  EKG None  Radiology DG Knee Complete 4 Views Right  Result Date: 04/07/2022 CLINICAL DATA:  [place] EXAM: RIGHT KNEE - COMPLETE 4+ VIEW COMPARISON:  08/31/2021 FINDINGS: Varus deformity of the knee, steadily progressive over time since the initial images of Jun 25, 2007. Worsening of abutment changes of the medial knee. I do not see a finding attributable to the acute fall. Certainly, these progressive changes suggest that re-evaluation by the managing orthopedist would be warranted electively. IMPRESSION: Varus deformity of the knee, steadily progressive over time since the initial images of Jun 25, 2007. Worsening of abutment changes of the medial knee. I do not see a finding attributable to the acute fall. Electronically Signed   By: Nelson Chimes M.D.   On: 04/07/2022 20:33   DG Lumbar Spine Complete  Result Date: 04/07/2022 CLINICAL DATA:  Recent fall with low back pain, initial encounter EXAM: LUMBAR SPINE - COMPLETE 4+ VIEW COMPARISON:  11/06/2017 FINDINGS: Five lumbar type vertebral bodies are well visualized. Facet hypertrophic changes are  seen. Mild osteophytic changes are noted. Mild anterolisthesis of L4 on L5 is seen stable from the prior exam. No acute fracture is noted. Fecal material is noted throughout the colon consistent with a mild degree of constipation. IMPRESSION: Degenerative change without significant acute abnormality. Changes of mild colonic constipation. Electronically Signed   By: Inez Catalina M.D.   On: 04/07/2022 20:31    Procedures Procedures    Medications Ordered in ED Medications  ketorolac (TORADOL) 15 MG/ML injection 15 mg (15 mg Intramuscular Given 04/07/22 2221)    ED Course/ Medical Decision Making/ A&P                             Medical Decision Making Amount and/or Complexity of Data Reviewed Radiology: ordered.  Risk Prescription drug management.     Patient with fall.  Right knee and low back pain.  No other.  Injury but is hypotensive.  With body habitus may not be an accurate blood pressure.  Will recheck.  Worried for injury to right knee.  Will check x-ray.  Also lumbar x-ray to help evaluate.  Did not hit head.  No neck pain.  X-ray reassuring.  Chronic changes.  Doubt acute fracture.  Later was complaining of buttock pain.  States she has been sitting on it so much that hurts.  On exam there are some skin thickening and some painful larger blisters potential cellulitis.  No clear abscess.  Will give antibiotics.  Discharge home.  Given shot of Toradol here.  I believe it is worth the risk.  Will have follow-up with Ortho as needed for the knee.  States she has seen Ortho before and they do not want to operate.        Final Clinical Impression(s) / ED Diagnoses Final diagnoses:  Fall, initial encounter  Cellulitis of buttock    Rx / DC Orders  ED Discharge Orders          Ordered    cephALEXin (KEFLEX) 500 MG capsule  2 times daily        04/07/22 2300              Davonna Belling, MD 04/07/22 2323

## 2022-04-11 ENCOUNTER — Other Ambulatory Visit: Payer: Self-pay

## 2022-04-18 ENCOUNTER — Ambulatory Visit (HOSPITAL_COMMUNITY): Payer: 59

## 2022-04-24 ENCOUNTER — Encounter: Payer: Self-pay | Admitting: Internal Medicine

## 2022-04-24 ENCOUNTER — Ambulatory Visit (INDEPENDENT_AMBULATORY_CARE_PROVIDER_SITE_OTHER): Payer: 59 | Admitting: Internal Medicine

## 2022-04-24 VITALS — BP 114/78 | Ht 65.0 in

## 2022-04-24 DIAGNOSIS — E1159 Type 2 diabetes mellitus with other circulatory complications: Secondary | ICD-10-CM

## 2022-04-24 DIAGNOSIS — E2089 Other specified hypoparathyroidism: Secondary | ICD-10-CM

## 2022-04-24 DIAGNOSIS — E890 Postprocedural hypothyroidism: Secondary | ICD-10-CM | POA: Diagnosis not present

## 2022-04-24 DIAGNOSIS — E1165 Type 2 diabetes mellitus with hyperglycemia: Secondary | ICD-10-CM

## 2022-04-24 LAB — LIPID PANEL
Cholesterol: 145 mg/dL (ref 0–200)
HDL: 69.4 mg/dL (ref >39.00)
LDL Cholesterol: 47 mg/dL (ref 0–99)
NonHDL: 75.77
Total CHOL/HDL Ratio: 2
Triglycerides: 146 mg/dL (ref 0.0–149.0)
VLDL: 29.2 mg/dL (ref 0.0–40.0)

## 2022-04-24 LAB — POCT GLYCOSYLATED HEMOGLOBIN (HGB A1C): Hemoglobin A1C: 6 % — AB (ref 4.0–5.6)

## 2022-04-24 LAB — TSH: TSH: 8.23 u[IU]/mL — ABNORMAL HIGH (ref 0.35–5.50)

## 2022-04-24 MED ORDER — HUMULIN N KWIKPEN 100 UNIT/ML ~~LOC~~ SUPN: 45.0000 [IU] | PEN_INJECTOR | SUBCUTANEOUS | 3 refills | Status: DC

## 2022-04-24 NOTE — Patient Instructions (Signed)
Please continue: - Trulicity 4.5 mg weekly  Reduce: - NPH 45 units before breakfast  Check some sugars later in the day, rotating the check times.  Continue Levothyroxine 200 mcg daily.  Take the thyroid hormone every day, with water, at least 30 minutes before breakfast, separated by at least 4 hours from: - acid reflux medications - calcium - iron - multivitamins  Please stop at the lab.  Please return in 4 months.

## 2022-04-24 NOTE — Progress Notes (Signed)
Patient ID: [name], female   DOB: 1955/05/05, 67 y.o.   MRN: [id number]  HPI: [name] is a 67 y.o.-year-old female, returning for follow-up for DM2, dx in 2014, insulin-dependent since 2015, uncontrolled, with  complications (h/o TIA, PVD, CKD, PN), also postsurgical hypothyroidism and hypoparathyroidism. Pt. previously saw Dr. Loanne Drilling, but last visit with me 6 months ago.  Interim history: She had several falls (weakness), including a fall on 04/07/2022 and developed cellulitis of the buttocks >> on ABx.  She was seen in the emergency room.  At today's visit, she is still in pain and she mentions that the antibiotic that she was given was not sufficient.  She saw PCP but was told that she does not need further antibiotics.  At today's visit, she was not able to stand for me to look at the wound.    DM2: Reviewed HbA1c: Lab Results  Component Value Date   HGBA1C 6.8 (A) 11/13/2021   HGBA1C 9.6 (A) 04/26/2021   HGBA1C 10.4 (A) 02/26/2021   HGBA1C 8.3 (A) 12/14/2020   HGBA1C 9.4 (H) 09/30/2020   HGBA1C 9.0 (A) 07/10/2020   HGBA1C 8.5 (A) 03/06/2020   HGBA1C 8.1 (A) 01/04/2020   HGBA1C 7.6 (A) 11/02/2019   HGBA1C 7.8 (A) 08/29/2019   Pt is on a regimen of: - Trulicity 4.5 mg weekly - NPH 45 >> 55 units in am (before b'fast)  Pt checks her sugars 1x a day and they are: - am: 96-150 >>  - 2h after b'fast: n/c >> 44, 84-109 - before lunch: n/c >> 118 - 2h after lunch: n/c - before dinner: n/c - 2h after dinner: n/c - bedtime: n/c - nighttime: n/c Lowest sugar was 75 >> 44; she has hypoglycemia awareness at 70.  Highest sugar was 150 >> ?[name]  Glucometer: One Touch Verio  - + CKD, last BUN/creatinine:  Lab Results  Component Value Date   BUN 27 (H) 09/11/2021   BUN 28 (H) 09/10/2021   CREATININE 1.38 (H) 09/11/2021   CREATININE 1.33 (H) 09/10/2021  She is on losartan 100 mg daily.  -+ HL; last set of lipids: Lab Results  Component Value Date   CHOL 111  02/29/2016   HDL 55 02/29/2016   LDLCALC 35 02/29/2016   TRIG 104 02/29/2016   CHOLHDL 2.0 02/29/2016  She is on Crestor 20 mg daily.  - last eye exam was in 08/14/2021: No DR.   - no numbness and tingling in her feet.  Last foot exam was on 10/01/2021. Seeing Dr. Fritzi Mandes.  Postsurgical hypothyroidism:  Pt is on levothyroxine 200 mcg daily, taken: - in am - fasting - at least 30 min from b'fast - no calcium - + iron - no multivitamins - + PPIs and H2B - not on Biotin  Reviewed her TSH levels: Lab Results  Component Value Date   TSH 1.75 12/12/2021   TSH 5.13 02/26/2021   TSH 8.111 (H) 10/03/2020   TSH 3.752 09/30/2020   TSH 4.85 (H) 07/10/2020   TSH 7.74 (H) 05/08/2020   TSH 5.86 (H) 01/04/2020   TSH 4.42 06/29/2019   TSH 1.41 12/17/2018   TSH 1.04 09/02/2018   TSH 8.04 (H) 03/23/2018   TSH 3.01 12/15/2017   TSH 4.04 06/15/2017   TSH 4.769 (H) 11/14/2016   TSH 1.63 08/22/2016   TSH 10.93 (A) 04/10/2016   TSH 2.363 03/28/2016   TSH 0.269 (L) 02/14/2016   TSH 2.185 06/23/2015   TSH 1.549 10/28/2014  Postsurgical hypocalcemia/hypoparathyroidism:  Reviewed pertinent labs: Component     Latest Ref Rng 12/12/2021  Calcium Ionized     4.7 - 5.5 mg/dL 4.9   Vitamin D 1, 25 (OH) Total     18 - 72 pg/mL 43   Vitamin D3 1, 25 (OH)     pg/mL 13   Vitamin D2 1, 25 (OH)     pg/mL 30    Lab Results  Component Value Date   PTH 36 04/26/2021   PTH 38 05/08/2020   PTH 55 01/04/2020   PTH 31 03/23/2018   PTH 80 (H) 08/22/2016   CALCIUM 9.4 09/11/2021   CALCIUM 9.4 09/10/2021   CALCIUM 9.4 09/09/2021   CALCIUM 9.3 09/08/2021   CALCIUM 9.0 09/07/2021   CALCIUM 9.4 09/06/2021   CALCIUM 9.3 09/05/2021   CALCIUM 8.7 (L) 09/04/2021   CALCIUM 8.9 09/03/2021   CALCIUM 9.1 09/02/2021   CALCIUM 9.0 09/01/2021   CALCIUM 9.4 08/31/2021   CALCIUM 9.9 04/26/2021   CALCIUM 9.4 03/12/2021   CALCIUM 9.9 02/26/2021   CALCIUM 9.9 11/10/2020   CALCIUM 8.8 (L) 10/05/2020    CALCIUM 9.2 10/03/2020   CALCIUM 9.4 10/02/2020   CALCIUM 9.3 10/01/2020   Levels have been normal: Lab Results  Component Value Date   VD25OH 53.82 12/12/2021   VD25OH 73.07 04/26/2021   VD25OH 77.34 05/08/2020   VD25OH 73.50 01/04/2020   VD25OH 45.33 03/23/2018   VD25OH 43.39 08/22/2016   VD25OH 40 05/12/2013   She is on: -  >> stopped 10/2021 - vit D3 1000 units daily  No perioral numbness. She has muscle cramping.  Patient also has a history of schizophrenia, HTN, anemia, BPPV, GERD, breast cancer, OSA-on, obesity, low back pain.  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   partial   benign cyst  Left    removed from left breast   BREAST BIOPSY Right 02/13/2012   BREAST LUMPECTOMY  03/03/2012   Procedure: LUMPECTOMY;  Surgeon: Haywood Lasso, MD;  Location: [facility];  Service: General;  Laterality: Right;   BREAST SURGERY Right 01/2012   "cancer"   CHOLECYSTECTOMY  1980's?   COLONOSCOPY WITH PROPOFOL N/A 09/28/2012   Procedure: COLONOSCOPY WITH PROPOFOL;  Surgeon: Lear Ng, MD;  Location: [facility];  Service: Endoscopy;  Laterality: N/A;   FOOT FRACTURE SURGERY Right 1990's   JOINT REPLACEMENT  x 3   LESION EXCISION N/A 03/13/2021   Procedure: EXCISION SCALP SEBACEOUS CYSTS x2;  Surgeon: Ralene Ok, MD;  Location: [facility];  Service: General;  Laterality: N/A;  60 MINUTES LOCAL & MAC   LUMBAR LAMINECTOMY/DECOMPRESSION MICRODISCECTOMY N/A 12/05/2016   Procedure: LEFT L5-S1 MICRODISCECTOMY;  Surgeon: Marybelle Killings, MD;  Location: [facility];  Service: Orthopedics;  Laterality: N/A;   REVISION TOTAL KNEE ARTHROPLASTY  07/2009   SHOULDER OPEN ROTATOR CUFF REPAIR Left 1990's   THYROIDECTOMY  1970's   TOTAL KNEE ARTHROPLASTY Bilateral 2009-06/2009   left; right   Social History   Socioeconomic History   Marital status: Single    Spouse name: Not on file   Number of children: 2   Years of education: 12   Highest education level: Not on file  Occupational History   Occupation: Disabled  Tobacco Use   Smoking status: Former    Packs/day: 1.00    Years: 7.00    Total pack years: 7.00    Types: Cigarettes    Quit date: 03/24/1992    Years since quitting: 30.1   Smokeless tobacco: Never  Vaping Use   Vaping Use: Never used  Substance and Sexual Activity   Alcohol use: No    Comment: "stopped drinking in 1996; I was an alcoholic"   Drug use: No   Sexual activity: Never    Birth control/protection: Surgical  Other Topics Concern   Not on file  Social History Narrative   Single. Disabled Biochemist, clinical.          Patient is right handed.    Denies caffeine use    Social Determinants of Radio broadcast assistant Strain: Not on file  Food Insecurity: Not on file  Transportation Needs: Not on file  Physical Activity: Not on file  Stress: Not on file  Social Connections: Not on file  Intimate Partner Violence: Not on file   Current Outpatient Medications on File Prior to Visit  Medication Sig Dispense Refill   acetaminophen  (TYLENOL) 650 MG CR tablet Take 1,300 mg by mouth every 8 (eight) hours as needed for pain.     albuterol (VENTOLIN HFA) 108 (90 Base) MCG/ACT inhaler Inhale 2 puffs into the lungs every 4 (four) hours as needed for wheezing or shortness of breath.      Ascorbic Acid (VITAMIN C PO) Take 1 tablet by mouth in the morning.     aspirin EC 81 MG tablet Take 1 tablet (81 mg total) by mouth daily. 30 tablet 0   BELBUCA 300 MCG FILM Take 300 mcg by mouth 2 (two) times daily. 6 each 0   belladonna-opium (B&O SUPPRETTES) 16.2-30 MG suppository Place 1 suppository rectally 2 (two) times daily as needed (for bladder spasms). 10 suppository 0   cephALEXin (KEFLEX) 500 MG capsule Take 1 capsule (500 mg total) by mouth 2 (two) times daily. 14 capsule 0   Cholecalciferol (VITAMIN D-3) 1000 units CAPS Take 1,000 Units by mouth daily.     Cyanocobalamin (VITAMIN B-12 PO) Take 1 tablet by mouth daily.     Dulaglutide (TRULICITY) 4.5 0000000 SOPN Inject 4.5 mg into the skin once a week. 6 mL 3   esomeprazole (NEXIUM) 40 MG capsule Take 1 capsule (40 mg total) by mouth daily before breakfast. 30 capsule 1   famotidine (PEPCID) 40 MG tablet Take 40 mg by mouth daily after supper.     ferrous sulfate 325 (65 FE) MG tablet Take 325 mg  by mouth in the morning.     furosemide (LASIX) 20 MG tablet Take 2 tablets (40 mg total) by mouth daily. (Patient taking differently: Take 60 mg by mouth in the morning.) 90 tablet 0   glucose blood (ONETOUCH VERIO) test strip TEST BLOOD SUGAR THREE TIMES DAILY 100 strip 11   Insulin NPH, Human,, Isophane, (HUMULIN N KWIKPEN) 100 UNIT/ML Kiwkpen Inject 55 Units into the skin every morning. And pen needles 1/day 60 mL 3   Lancets (ONETOUCH DELICA PLUS 123XX123) MISC 1 each by Other route 3 (three) times daily. Use to check blood sugar 3 times a day 300 each 11   levothyroxine (SYNTHROID) 200 MCG tablet Take 1 tablet (200 mcg total) by mouth daily before breakfast. 90 tablet 0   linaclotide  (LINZESS) 290 MCG CAPS capsule Take 290 mcg by mouth daily before breakfast.     LORazepam (ATIVAN) 1 MG tablet Take 1 tablet (1 mg total) by mouth in the morning, at noon, and at bedtime. 6 tablet 0   lubiprostone (AMITIZA) 24 MCG capsule Take 24 mcg by mouth 2 (two) times daily.     mirabegron ER (MYRBETRIQ) 25 MG TB24 tablet Take 25 mg by mouth in the morning.     naloxone (NARCAN) nasal spray 4 mg/0.1 mL Place 1 spray into the nose as needed for opioid reversal.     polyvinyl alcohol (LIQUIFILM TEARS) 1.4 % ophthalmic solution 1 drop as needed for dry eyes.     potassium chloride SA (KLOR-CON) 20 MEQ tablet Take 20 mEq by mouth daily.     pregabalin (LYRICA) 300 MG capsule Take 300 mg by mouth 2 (two) times daily.     rosuvastatin (CRESTOR) 20 MG tablet Take 20 mg by mouth daily.     sucralfate (CARAFATE) 1 g tablet Take 1 g by mouth in the morning.     SURE COMFORT PEN NEEDLES 32G X 6 MM MISC USE TO INJECT INSULIN FOUR TIMES DAILY 200 each 1   tolterodine (DETROL LA) 2 MG 24 hr capsule Take 2 mg by mouth every evening.     valACYclovir (VALTREX) 1000 MG tablet Take 1,000 mg by mouth daily.     venlafaxine XR (EFFEXOR-XR) 150 MG 24 hr capsule Take 150 mg by mouth 2 (two) times daily.     ziprasidone (GEODON) 60 MG capsule Take 60 mg by mouth daily after supper.     No current facility-administered medications on file prior to visit.   Allergies  Allergen Reactions   Bee Venom Anaphylaxis   Invokana [Canagliflozin] Anaphylaxis    Dyspnea and urinary retention   Shellfish Allergy Anaphylaxis   Buprenorphine Hcl Other (See Comments)    Overly sedated with morphine drip.   Other Other (See Comments)    Cats - bad asthma, stopped up   Family History  Problem Relation Age of Onset   Heart disease Brother        Multiple MIs, starting in his 38s   Diabetes Brother    Thyroid disease Brother    Hypertension Mother    Diabetes Mother    Breast cancer Mother 50   Bone cancer Mother     Hypertension Sister    Diabetes Sister    Breast cancer Sister 17   Thyroid disease Sister    Breast cancer Maternal Grandmother    Heart disease Maternal Grandmother    Uterine cancer Other 19   Breast cancer Paternal Aunt 65   Breast cancer Paternal Grandmother  dx in her 47s   Prostate cancer Paternal Grandfather    Asthma Son    PE: BP 114/78 (BP Location: Left Arm, Patient Position: Sitting, Cuff Size: Normal)   Ht '5\' 5"'$  (1.651 m)   BMI 58.24 kg/m  Pt in wheelchair - could not be weighed. Wt Readings from Last 3 Encounters:  04/07/22 (!) 350 lb (158.8 kg)  09/13/21 (!) 349 lb 6.9 oz (158.5 kg)  03/29/21 (!) 350 lb (158.8 kg)   Constitutional: overweight, in NAD Eyes: no exophthalmos ENT: no thyromegaly, no cervical lymphadenopathy Cardiovascular: RRR, No MRG, + signif.  bilateral LE edema Respiratory: CTA B Musculoskeletal: no deformities Skin: no rashes Neurological: + tremor with outstretched hands L>R  ASSESSMENT: 1. DM2, insulin-dependent, uncontrolled, with complications - PVD - h/o TIA - CKD - PN  2.  Hyperlipidemia  3.  Postsurgical hypothyroidism  4.  Postsurgical hypoparathyroidism  PLAN:  1. Patient with longstanding, previously uncontrolled, type 2 diabetes, on injectable antidiabetic regimen with intermediate acting insulin and weekly GLP-1 receptor agonist.  At last visit, HbA1c was 6.8%, the best she had in a long time.  She is taking NPH only once a day, but due to the good results at last visit, I did not advise her to change this.  I did advise her to take NPH before breakfast.  She was taking it either before or after this meal. - at today's visit, sugars are either at goal or low (44 yesterday), after breakfast.  She is taking 55 units of NPH in the morning and I advised her to decrease the dose to 45 units.  Will continue Trulicity for now. - I suggested to:  Patient Instructions  Please continue: - Trulicity 4.5 mg  weekly  Reduce: - NPH 45 units before breakfast  Check some sugars later in the day, rotating the check times.  Continue Levothyroxine 200 mcg daily.  Take the thyroid hormone every day, with water, at least 30 minutes before breakfast, separated by at least 4 hours from: - acid reflux medications - calcium - iron - multivitamins  Please stop at the lab.  Please return in 4 months.  - we checked her HbA1c: 6% (lower) - advised to check sugars at different times of the day - 2x a day, rotating check times - advised for yearly eye exams >> she is UTD - Regarding the buttocks wound, I am not able to see it as she cannot stand.  He is in considerable distress due to pain.  We discussed that she may have an abscess.  Unfortunately, I had to direct her to urgent care. - return to clinic in 4 months  2.  Hyperlipidemia -Reviewed latest panel from 2018: All fractions at goal:  Lab Results  Component Value Date   CHOL 111 02/29/2016   HDL 55 02/29/2016   LDLCALC 35 02/29/2016   TRIG 104 02/29/2016   CHOLHDL 2.0 02/29/2016  -She continues Crestor 20 mg daily.  She does have muscle aches, unclear if related to Crestor. -She is due for another lipid panel -will check this today  3.  Postsurgical hypothyroidism - post total thyroidectomy for benign goiter in 1988 - latest thyroid labs reviewed with pt. >> normal: Lab Results  Component Value Date   TSH 1.75 12/12/2021  - she continues on LT4 200 mcg daily - pt feels good on this dose. - we discussed about taking the thyroid hormone every day, with water, >30 minutes before breakfast, separated by >4 hours  from acid reflux medications, calcium, iron, multivitamins. Pt. is taking it correctly. - will check thyroid tests today  4. Postsurgical hypoparathyroidism  -Resolved -At last visit she was on calcitriol 0.25 mg 3 times a week, but we stopped this at last visit >> subsequent ionized calcium and calcitriol levels remained  normal -We continued 1000 units vitamin D3 daily -She has no muscle weakness, tingling, or perioral numbness.  She does have muscle aches, which are chronic, unclear if related to Crestor -We will recheck her ionized calcium today  Component     Latest Ref Rng 04/24/2022  Hemoglobin A1C     4.0 - 5.6 % 6.0 !   TSH     0.35 - 5.50 uIU/mL 8.23 (H)   Calcium Ionized     4.7 - 5.5 mg/dL 5.1   Cholesterol     0 - 200 mg/dL 145   Triglycerides     0.0 - 149.0 mg/dL 146.0   HDL Cholesterol     >39.00 mg/dL 69.40   VLDL     0.0 - 40.0 mg/dL 29.2   LDL (calc)     0 - 99 mg/dL 47   Total CHOL/HDL Ratio 2   NonHDL 75.77   TSH is elevated.  This is unusual, since she is taking quite a high dose of levothyroxine.  I will strongly advised her not to miss doses and to take it correctly and repeat the tests at next visit. Her ionized calcium and lipid panel are excellent.  Philemon Kingdom, MD PhD  [facility] Endocrinology

## 2022-04-25 ENCOUNTER — Other Ambulatory Visit: Payer: Self-pay

## 2022-04-25 DIAGNOSIS — E890 Postprocedural hypothyroidism: Secondary | ICD-10-CM

## 2022-04-25 LAB — CALCIUM, IONIZED: Calcium, Ion: 5.1 mg/dL (ref 4.7–5.5)

## 2022-04-25 MED ORDER — LEVOTHYROXINE SODIUM 200 MCG PO TABS: ORAL_TABLET | ORAL | 1 refills | Status: DC

## 2022-05-06 ENCOUNTER — Other Ambulatory Visit (HOSPITAL_COMMUNITY): Payer: Self-pay | Admitting: Family Medicine

## 2022-05-06 ENCOUNTER — Ambulatory Visit (HOSPITAL_COMMUNITY)
Admission: RE | Admit: 2022-05-06 | Discharge: 2022-05-06 | Disposition: A | Discharge status: Home / Self Care | Payer: 59 | Source: Ambulatory Visit | Attending: Gastroenterology | Admitting: Gastroenterology

## 2022-05-06 ENCOUNTER — Ambulatory Visit (HOSPITAL_COMMUNITY)
Admission: RE | Admit: 2022-05-06 | Discharge: 2022-05-06 | Disposition: A | Discharge status: Home / Self Care | Payer: 59 | Source: Ambulatory Visit | Attending: Family Medicine | Admitting: Family Medicine

## 2022-05-06 DIAGNOSIS — M546 Pain in thoracic spine: Secondary | ICD-10-CM

## 2022-05-06 DIAGNOSIS — M542 Cervicalgia: Secondary | ICD-10-CM

## 2022-05-06 DIAGNOSIS — M5137 Other intervertebral disc degeneration, lumbosacral region: Secondary | ICD-10-CM | POA: Insufficient documentation

## 2022-05-06 DIAGNOSIS — R1084 Generalized abdominal pain: Secondary | ICD-10-CM

## 2022-05-08 IMAGING — CT CT ABD-PELV W/ CM
2 of 5 series · 16 of 46 positions shown, 18 images · IV contrast (OMNIPAQUE 350)
Comparison: 04/02/2016

CLINICAL DATA: Decreased urinary output over the last 2 days.
Constipation for 5 days. Left upper quadrant abdominal pain.

EXAM:
CT ABDOMEN AND PELVIS WITH CONTRAST
TECHNIQUE: Multidetector CT imaging of the abdomen and pelvis was performed
using the standard protocol following bolus administration of
intravenous contrast.
CONTRAST:  100mL OMNIPAQUE IOHEXOL 350 MG/ML SOLN

[Series 2: axial st · axial · 0.92mm/px · z∈[-500,-80]mm · 13 of 100 slices shown, 15 images]
[im 8/100  soft-tissue]
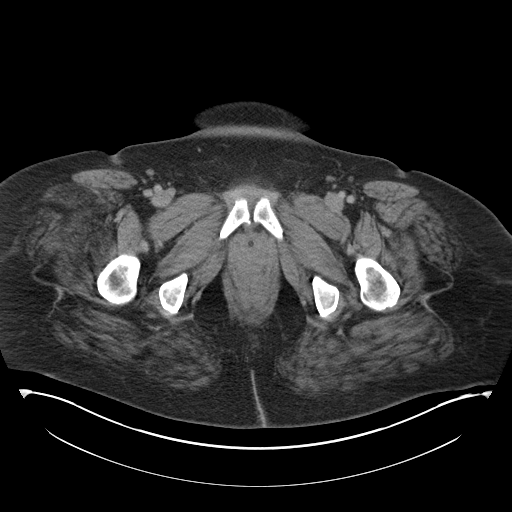
[im 8/100  bone]
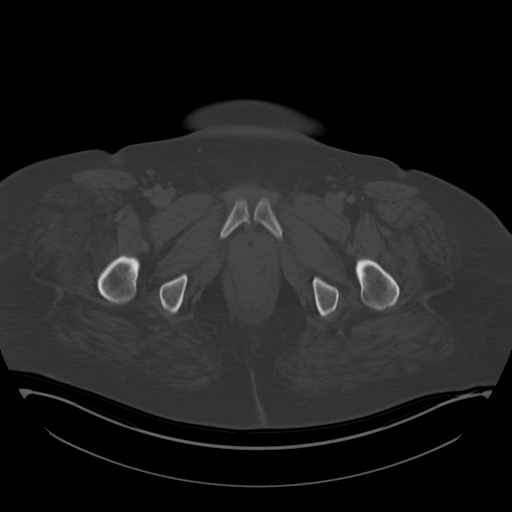
[im 15/100  soft-tissue]
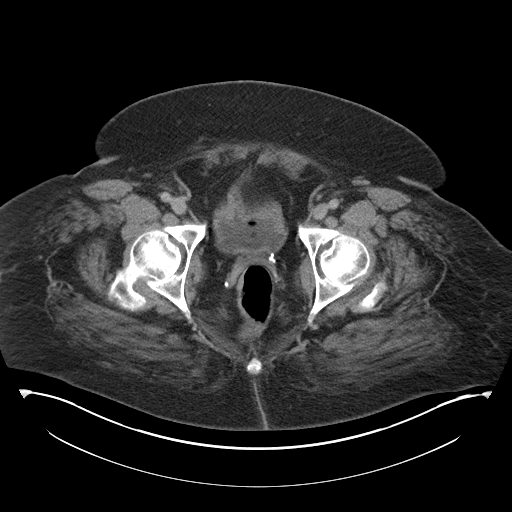
[im 22/100  soft-tissue]
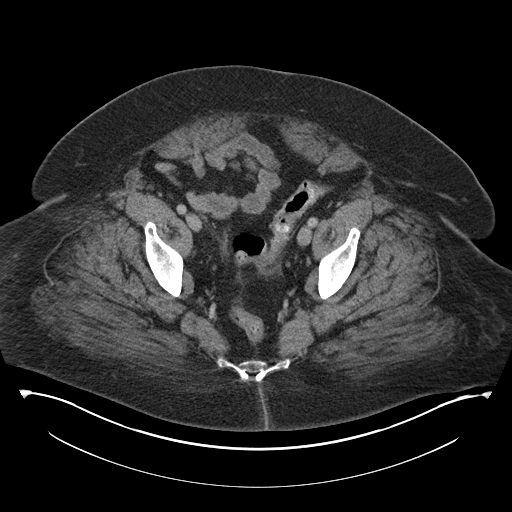
[im 29/100  soft-tissue]
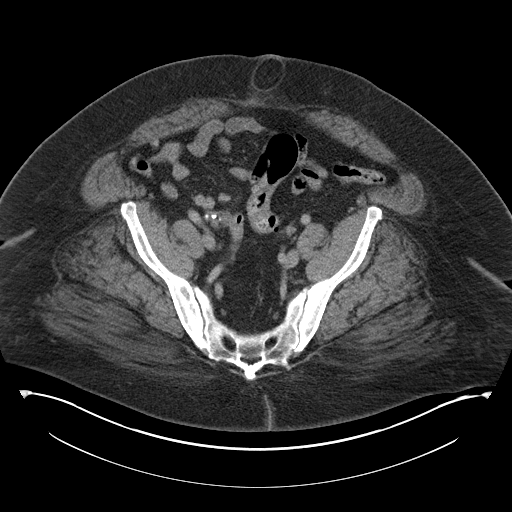
[im 36/100  soft-tissue]
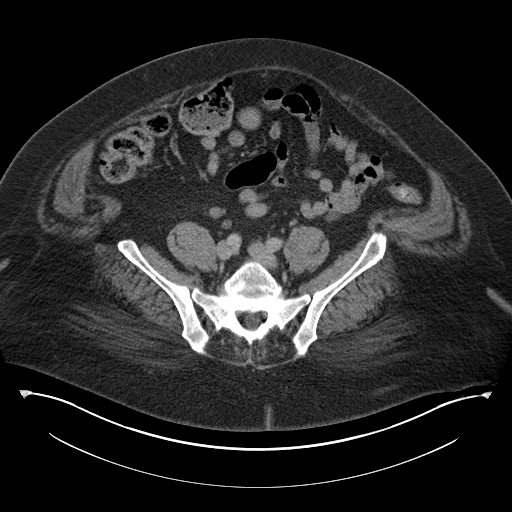
[im 43/100  soft-tissue]
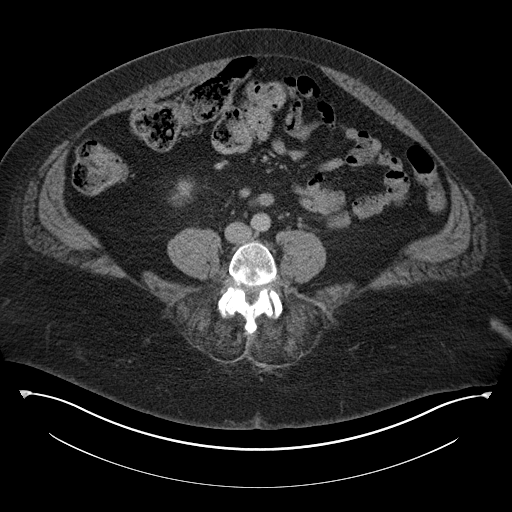
[im 50/100  soft-tissue]
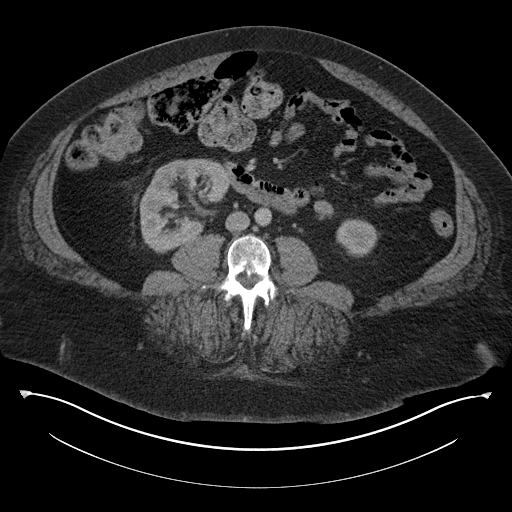
[im 57/100  soft-tissue]
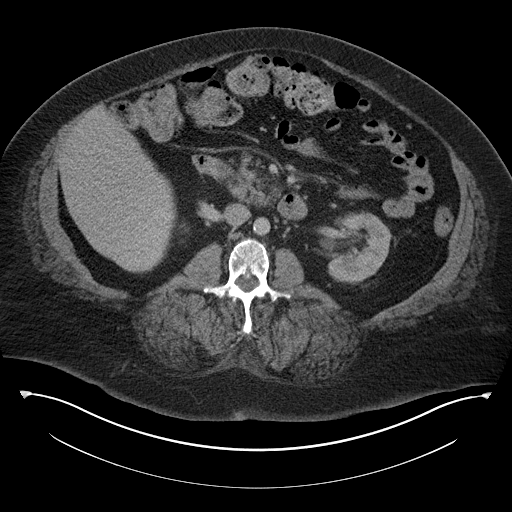
[im 64/100  soft-tissue]
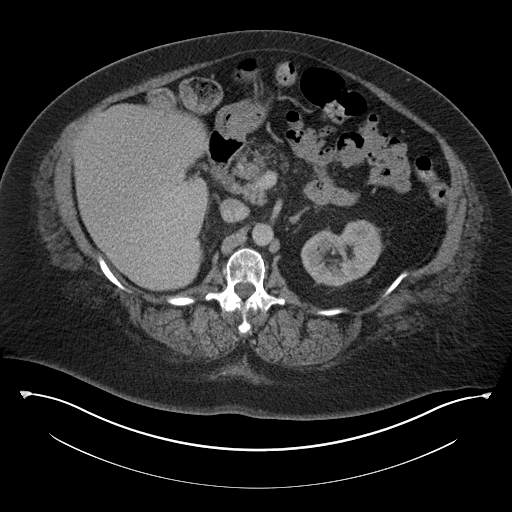
[im 64/100  bone]
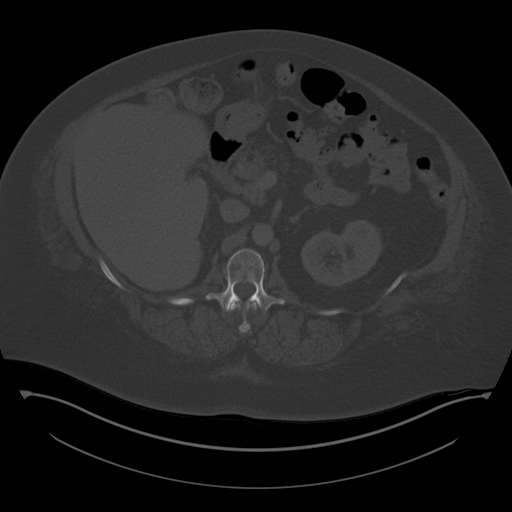
[im 71/100  soft-tissue]
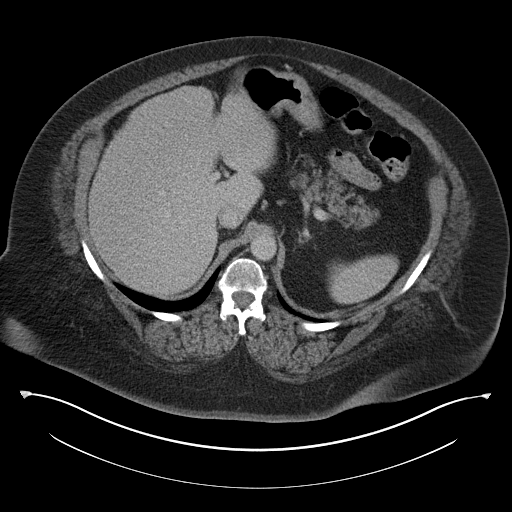
[im 78/100  soft-tissue]
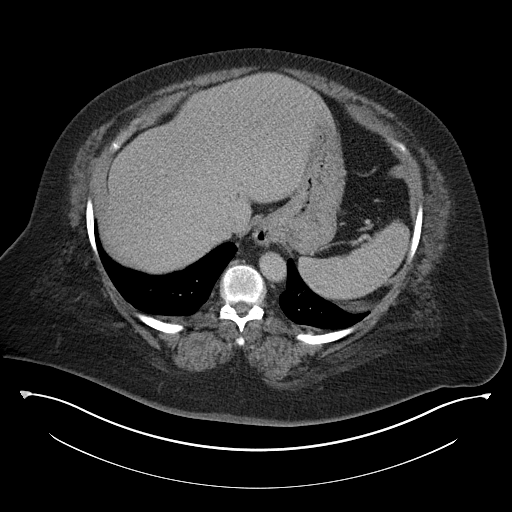
[im 85/100  soft-tissue]
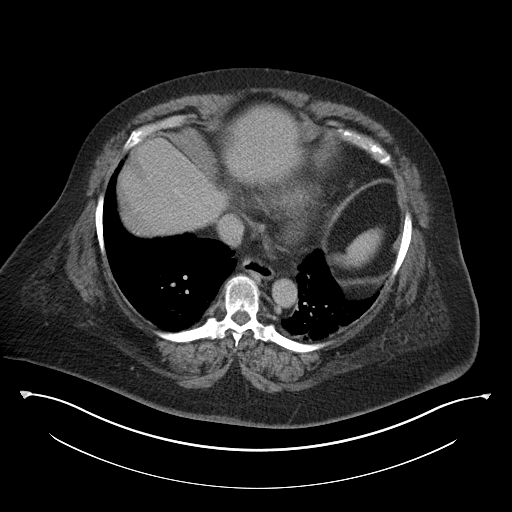
[im 92/100  soft-tissue]
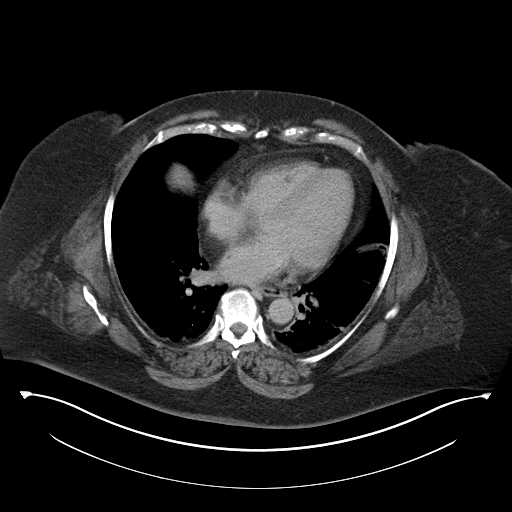

[Series 5: coronal st · coronal · 0.94mm/px · 3 of 171 slices shown]
[im 57/171  soft-tissue]
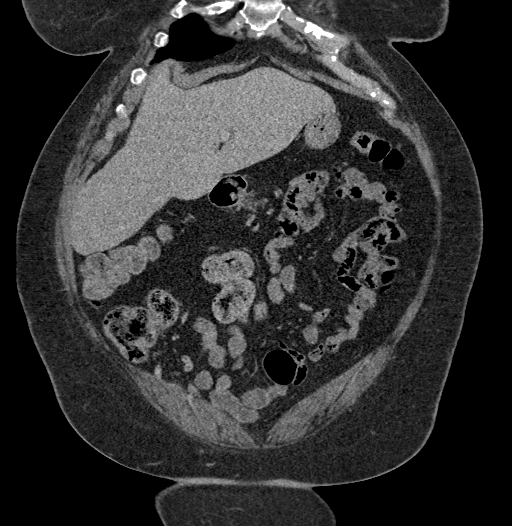
[im 76/171  soft-tissue]
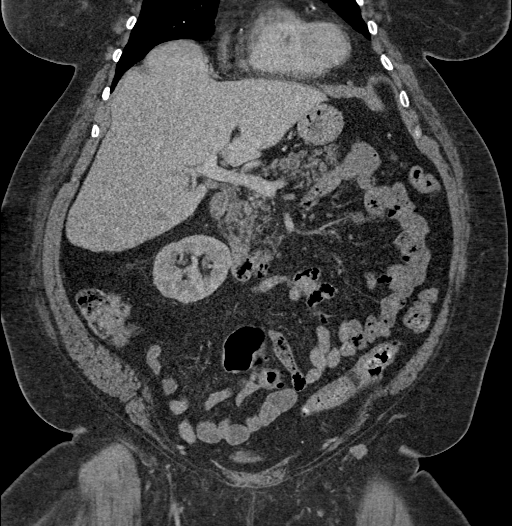
[im 95/171  soft-tissue]
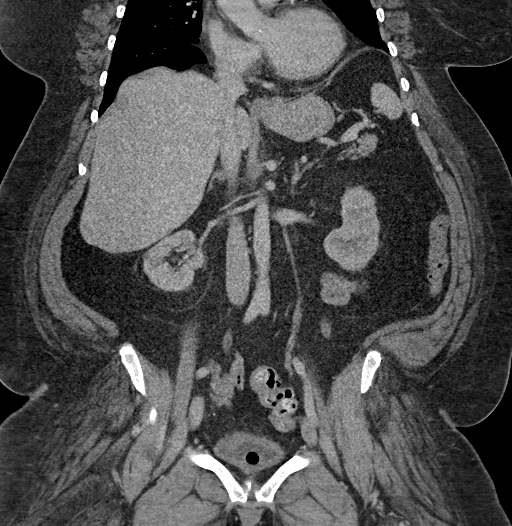

[16 of 46 positions shown; findings below may reference images not displayed]

FINDINGS: Lower chest: Linear scarring and/or atelectasis.  No acute findings.

Hepatobiliary: No focal liver abnormality is seen. Status post
cholecystectomy. No biliary dilatation.

Pancreas: Unremarkable. No pancreatic ductal dilatation or
surrounding inflammatory changes.

Spleen: Normal in size without focal abnormality.

Adrenals/Urinary Tract: No adrenal masses.

Mild bilateral renal cortical thinning. Small low-attenuation masses
in the left kidney mid and lower pole, consistent with cysts, but
not fully characterized. Tiny cortical low-density lesion, midpole
the right kidney, also likely a cyst. No stones. No hydronephrosis.
Normal ureters. Bladder decompressed with a Foley catheter.

Stomach/Bowel: Normal stomach. Small bowel and colon are normal in
caliber. No wall thickening. No inflammation. Normal appendix
visualized.

Vascular/Lymphatic: Minor aortic atherosclerosis. No aneurysm. No
enlarged lymph nodes.

Reproductive: Status post hysterectomy. No adnexal masses.

Other: Small fat containing umbilical hernia.  No ascites.

Musculoskeletal: No fracture or acute finding.  No bone lesion.
IMPRESSION: 1. No acute findings within the abdomen or pelvis. No findings to
account for abdominal pain.
2. Mild aortic atherosclerosis.

## 2022-05-19 ENCOUNTER — Other Ambulatory Visit: Payer: Self-pay

## 2022-05-19 DIAGNOSIS — E1165 Type 2 diabetes mellitus with hyperglycemia: Secondary | ICD-10-CM

## 2022-05-19 MED ORDER — HUMULIN N KWIKPEN 100 UNIT/ML ~~LOC~~ SUPN: 45.0000 [IU] | PEN_INJECTOR | SUBCUTANEOUS | 2 refills | Status: DC

## 2022-05-23 ENCOUNTER — Other Ambulatory Visit: Payer: Self-pay

## 2022-05-23 DIAGNOSIS — E1165 Type 2 diabetes mellitus with hyperglycemia: Secondary | ICD-10-CM

## 2022-05-23 DIAGNOSIS — E118 Type 2 diabetes mellitus with unspecified complications: Secondary | ICD-10-CM

## 2022-05-23 MED ORDER — ONETOUCH DELICA PLUS LANCET33G MISC: 1.0000 | Freq: Three times a day (TID) | 11 refills | Status: DC

## 2022-05-23 MED ORDER — HUMULIN N KWIKPEN 100 UNIT/ML ~~LOC~~ SUPN: 45.0000 [IU] | PEN_INJECTOR | SUBCUTANEOUS | 2 refills | Status: DC

## 2022-07-22 ENCOUNTER — Encounter (HOSPITAL_COMMUNITY): Payer: Self-pay

## 2022-07-22 ENCOUNTER — Other Ambulatory Visit: Payer: Self-pay

## 2022-07-22 ENCOUNTER — Emergency Department (HOSPITAL_COMMUNITY): Payer: 59

## 2022-07-22 ENCOUNTER — Inpatient Hospital Stay (HOSPITAL_COMMUNITY)
Admission: EM | Admit: 2022-07-22 | Discharge: 2022-07-25 | DRG: 291 | Disposition: A | Discharge status: Home Health | Payer: 59 | Attending: Internal Medicine | Admitting: Internal Medicine

## 2022-07-22 DIAGNOSIS — F209 Schizophrenia, unspecified: Secondary | ICD-10-CM | POA: Diagnosis present

## 2022-07-22 DIAGNOSIS — Z96653 Presence of artificial knee joint, bilateral: Secondary | ICD-10-CM | POA: Diagnosis present

## 2022-07-22 DIAGNOSIS — E871 Hypo-osmolality and hyponatremia: Secondary | ICD-10-CM | POA: Diagnosis present

## 2022-07-22 DIAGNOSIS — N179 Acute kidney failure, unspecified: Secondary | ICD-10-CM | POA: Diagnosis present

## 2022-07-22 DIAGNOSIS — E1165 Type 2 diabetes mellitus with hyperglycemia: Secondary | ICD-10-CM

## 2022-07-22 DIAGNOSIS — E890 Postprocedural hypothyroidism: Secondary | ICD-10-CM | POA: Diagnosis present

## 2022-07-22 DIAGNOSIS — Z8349 Family history of other endocrine, nutritional and metabolic diseases: Secondary | ICD-10-CM

## 2022-07-22 DIAGNOSIS — Z853 Personal history of malignant neoplasm of breast: Secondary | ICD-10-CM

## 2022-07-22 DIAGNOSIS — E118 Type 2 diabetes mellitus with unspecified complications: Secondary | ICD-10-CM

## 2022-07-22 DIAGNOSIS — N2581 Secondary hyperparathyroidism of renal origin: Secondary | ICD-10-CM

## 2022-07-22 DIAGNOSIS — Z833 Family history of diabetes mellitus: Secondary | ICD-10-CM

## 2022-07-22 DIAGNOSIS — K219 Gastro-esophageal reflux disease without esophagitis: Secondary | ICD-10-CM | POA: Diagnosis present

## 2022-07-22 DIAGNOSIS — Z91013 Allergy to seafood: Secondary | ICD-10-CM

## 2022-07-22 DIAGNOSIS — Z8249 Family history of ischemic heart disease and other diseases of the circulatory system: Secondary | ICD-10-CM

## 2022-07-22 DIAGNOSIS — Z888 Allergy status to other drugs, medicaments and biological substances status: Secondary | ICD-10-CM

## 2022-07-22 DIAGNOSIS — Z803 Family history of malignant neoplasm of breast: Secondary | ICD-10-CM

## 2022-07-22 DIAGNOSIS — J4530 Mild persistent asthma, uncomplicated: Secondary | ICD-10-CM | POA: Diagnosis present

## 2022-07-22 DIAGNOSIS — G4733 Obstructive sleep apnea (adult) (pediatric): Secondary | ICD-10-CM

## 2022-07-22 DIAGNOSIS — Z794 Long term (current) use of insulin: Secondary | ICD-10-CM

## 2022-07-22 DIAGNOSIS — E662 Morbid (severe) obesity with alveolar hypoventilation: Secondary | ICD-10-CM

## 2022-07-22 DIAGNOSIS — I130 Hypertensive heart and chronic kidney disease with heart failure and stage 1 through stage 4 chronic kidney disease, or unspecified chronic kidney disease: Secondary | ICD-10-CM | POA: Diagnosis not present

## 2022-07-22 DIAGNOSIS — E1122 Type 2 diabetes mellitus with diabetic chronic kidney disease: Secondary | ICD-10-CM | POA: Diagnosis present

## 2022-07-22 DIAGNOSIS — E11649 Type 2 diabetes mellitus with hypoglycemia without coma: Secondary | ICD-10-CM | POA: Diagnosis not present

## 2022-07-22 DIAGNOSIS — Z1152 Encounter for screening for COVID-19: Secondary | ICD-10-CM

## 2022-07-22 DIAGNOSIS — Z7401 Bed confinement status: Secondary | ICD-10-CM

## 2022-07-22 DIAGNOSIS — D631 Anemia in chronic kidney disease: Secondary | ICD-10-CM | POA: Diagnosis present

## 2022-07-22 DIAGNOSIS — R0602 Shortness of breath: Secondary | ICD-10-CM | POA: Diagnosis not present

## 2022-07-22 DIAGNOSIS — I5033 Acute on chronic diastolic (congestive) heart failure: Secondary | ICD-10-CM | POA: Diagnosis present

## 2022-07-22 DIAGNOSIS — E119 Type 2 diabetes mellitus without complications: Secondary | ICD-10-CM

## 2022-07-22 DIAGNOSIS — R0902 Hypoxemia: Secondary | ICD-10-CM

## 2022-07-22 DIAGNOSIS — Z9981 Dependence on supplemental oxygen: Secondary | ICD-10-CM

## 2022-07-22 DIAGNOSIS — Z9049 Acquired absence of other specified parts of digestive tract: Secondary | ICD-10-CM

## 2022-07-22 DIAGNOSIS — Z79890 Hormone replacement therapy (postmenopausal): Secondary | ICD-10-CM

## 2022-07-22 DIAGNOSIS — Z7985 Long-term (current) use of injectable non-insulin antidiabetic drugs: Secondary | ICD-10-CM

## 2022-07-22 DIAGNOSIS — Z923 Personal history of irradiation: Secondary | ICD-10-CM

## 2022-07-22 DIAGNOSIS — J9611 Chronic respiratory failure with hypoxia: Secondary | ICD-10-CM | POA: Diagnosis present

## 2022-07-22 DIAGNOSIS — Z7982 Long term (current) use of aspirin: Secondary | ICD-10-CM

## 2022-07-22 DIAGNOSIS — Z87891 Personal history of nicotine dependence: Secondary | ICD-10-CM

## 2022-07-22 DIAGNOSIS — E785 Hyperlipidemia, unspecified: Secondary | ICD-10-CM | POA: Diagnosis present

## 2022-07-22 DIAGNOSIS — I5032 Chronic diastolic (congestive) heart failure: Secondary | ICD-10-CM

## 2022-07-22 DIAGNOSIS — E039 Hypothyroidism, unspecified: Secondary | ICD-10-CM | POA: Diagnosis present

## 2022-07-22 DIAGNOSIS — Z79891 Long term (current) use of opiate analgesic: Secondary | ICD-10-CM

## 2022-07-22 DIAGNOSIS — G894 Chronic pain syndrome: Secondary | ICD-10-CM | POA: Diagnosis present

## 2022-07-22 DIAGNOSIS — D638 Anemia in other chronic diseases classified elsewhere: Secondary | ICD-10-CM | POA: Diagnosis present

## 2022-07-22 DIAGNOSIS — Z91030 Bee allergy status: Secondary | ICD-10-CM

## 2022-07-22 DIAGNOSIS — N1832 Chronic kidney disease, stage 3b: Secondary | ICD-10-CM | POA: Diagnosis present

## 2022-07-22 DIAGNOSIS — I10 Essential (primary) hypertension: Secondary | ICD-10-CM | POA: Diagnosis present

## 2022-07-22 DIAGNOSIS — E1142 Type 2 diabetes mellitus with diabetic polyneuropathy: Secondary | ICD-10-CM | POA: Diagnosis present

## 2022-07-22 DIAGNOSIS — L249 Irritant contact dermatitis, unspecified cause: Secondary | ICD-10-CM | POA: Diagnosis present

## 2022-07-22 DIAGNOSIS — I272 Pulmonary hypertension, unspecified: Secondary | ICD-10-CM | POA: Diagnosis present

## 2022-07-22 DIAGNOSIS — Z6841 Body Mass Index (BMI) 40.0 and over, adult: Secondary | ICD-10-CM

## 2022-07-22 DIAGNOSIS — Z79899 Other long term (current) drug therapy: Secondary | ICD-10-CM

## 2022-07-22 DIAGNOSIS — E876 Hypokalemia: Secondary | ICD-10-CM | POA: Insufficient documentation

## 2022-07-22 DIAGNOSIS — J9621 Acute and chronic respiratory failure with hypoxia: Secondary | ICD-10-CM | POA: Diagnosis present

## 2022-07-22 DIAGNOSIS — I060 Rheumatic aortic stenosis: Secondary | ICD-10-CM | POA: Diagnosis present

## 2022-07-22 LAB — MAGNESIUM: Magnesium: 2 mg/dL (ref 1.7–2.4)

## 2022-07-22 LAB — CBC WITH DIFFERENTIAL/PLATELET
Abs Immature Granulocytes: 0.03 10*3/uL (ref 0.00–0.07)
Basophils Absolute: 0.1 10*3/uL (ref 0.0–0.1)
Basophils Relative: 1 %
Eosinophils Absolute: 0.2 10*3/uL (ref 0.0–0.5)
Eosinophils Relative: 2 %
HCT: 29.6 % — ABNORMAL LOW (ref 36.0–46.0)
Hemoglobin: 9.3 g/dL — ABNORMAL LOW (ref 12.0–15.0)
Immature Granulocytes: 0 %
Lymphocytes Relative: 10 %
Lymphs Abs: 0.7 10*3/uL (ref 0.7–4.0)
MCH: 26 pg (ref 26.0–34.0)
MCHC: 31.4 g/dL (ref 30.0–36.0)
MCV: 82.7 fL (ref 80.0–100.0)
Monocytes Absolute: 0.5 10*3/uL (ref 0.1–1.0)
Monocytes Relative: 7 %
Neutro Abs: 6.1 10*3/uL (ref 1.7–7.7)
Neutrophils Relative %: 80 %
Platelets: 201 10*3/uL (ref 150–400)
RBC: 3.58 MIL/uL — ABNORMAL LOW (ref 3.87–5.11)
RDW: 16.8 % — ABNORMAL HIGH (ref 11.5–15.5)
WBC: 7.6 10*3/uL (ref 4.0–10.5)
nRBC: 0 % (ref 0.0–0.2)

## 2022-07-22 LAB — URINALYSIS, ROUTINE W REFLEX MICROSCOPIC
Bilirubin Urine: NEGATIVE
Glucose, UA: NEGATIVE mg/dL
Hgb urine dipstick: NEGATIVE
Ketones, ur: NEGATIVE mg/dL
Leukocytes,Ua: NEGATIVE
Nitrite: NEGATIVE
Protein, ur: NEGATIVE mg/dL
Specific Gravity, Urine: 1.005 (ref 1.005–1.030)
pH: 5 (ref 5.0–8.0)

## 2022-07-22 LAB — COMPREHENSIVE METABOLIC PANEL
ALT: 14 U/L (ref 0–44)
AST: 15 U/L (ref 15–41)
Albumin: 3.5 g/dL (ref 3.5–5.0)
Alkaline Phosphatase: 73 U/L (ref 38–126)
Anion gap: 14 (ref 5–15)
BUN: 47 mg/dL — ABNORMAL HIGH (ref 8–23)
CO2: 22 mmol/L (ref 22–32)
Calcium: 9.1 mg/dL (ref 8.9–10.3)
Chloride: 96 mmol/L — ABNORMAL LOW (ref 98–111)
Creatinine, Ser: 2.58 mg/dL — ABNORMAL HIGH (ref 0.44–1.00)
GFR, Estimated: 20 mL/min — ABNORMAL LOW (ref >60)
Glucose, Bld: 104 mg/dL — ABNORMAL HIGH (ref 70–99)
Potassium: 3.3 mmol/L — ABNORMAL LOW (ref 3.5–5.1)
Sodium: 132 mmol/L — ABNORMAL LOW (ref 135–145)
Total Bilirubin: 0.4 mg/dL (ref 0.3–1.2)
Total Protein: 6.1 g/dL — ABNORMAL LOW (ref 6.5–8.1)

## 2022-07-22 LAB — I-STAT VENOUS BLOOD GAS, ED
Acid-Base Excess: 3 mmol/L — ABNORMAL HIGH (ref 0.0–2.0)
Bicarbonate: 30.1 mmol/L — ABNORMAL HIGH (ref 20.0–28.0)
Calcium, Ion: 1.16 mmol/L (ref 1.15–1.40)
HCT: 28 % — ABNORMAL LOW (ref 36.0–46.0)
Hemoglobin: 9.5 g/dL — ABNORMAL LOW (ref 12.0–15.0)
O2 Saturation: 67 %
Potassium: 3.4 mmol/L — ABNORMAL LOW (ref 3.5–5.1)
Sodium: 136 mmol/L (ref 135–145)
TCO2: 32 mmol/L (ref 22–32)
pCO2, Ven: 59.5 mmHg (ref 44–60)
pH, Ven: 7.312 (ref 7.25–7.43)
pO2, Ven: 39 mmHg (ref 32–45)

## 2022-07-22 LAB — TROPONIN I (HIGH SENSITIVITY)
Troponin I (High Sensitivity): 5 ng/L (ref <18)
Troponin I (High Sensitivity): 4 ng/L (ref <18)

## 2022-07-22 LAB — D-DIMER, QUANTITATIVE: D-Dimer, Quant: 1.61 ug/mL-FEU — ABNORMAL HIGH (ref 0.00–0.50)

## 2022-07-22 LAB — SARS CORONAVIRUS 2 BY RT PCR: SARS Coronavirus 2 by RT PCR: NEGATIVE

## 2022-07-22 LAB — CBG MONITORING, ED: Glucose-Capillary: 137 mg/dL — ABNORMAL HIGH (ref 70–99)

## 2022-07-22 LAB — BRAIN NATRIURETIC PEPTIDE: B Natriuretic Peptide: 50.4 pg/mL (ref 0.0–100.0)

## 2022-07-22 MED ORDER — ROSUVASTATIN CALCIUM 20 MG PO TABS
20.0000 mg | ORAL_TABLET | Freq: Every day | ORAL | Status: DC
  Administered 2022-07-23 – 2022-07-25 (×3): 20 mg via ORAL
  Filled 2022-07-22 (×3): qty 1

## 2022-07-22 MED ORDER — BUDESONIDE 0.5 MG/2ML IN SUSP
0.5000 mg | Freq: Two times a day (BID) | RESPIRATORY_TRACT | Status: DC
  Administered 2022-07-22 – 2022-07-24 (×4): 0.5 mg via RESPIRATORY_TRACT
  Filled 2022-07-22 (×6): qty 2

## 2022-07-22 MED ORDER — ALBUTEROL SULFATE (2.5 MG/3ML) 0.083% IN NEBU: 2.5000 mg | INHALATION_SOLUTION | RESPIRATORY_TRACT | Status: DC | PRN

## 2022-07-22 MED ORDER — LEVOTHYROXINE SODIUM 100 MCG PO TABS
200.0000 ug | ORAL_TABLET | Freq: Every day | ORAL | Status: DC
  Administered 2022-07-23 – 2022-07-24 (×2): 200 ug via ORAL
  Filled 2022-07-22 (×2): qty 2

## 2022-07-22 MED ORDER — LINACLOTIDE 145 MCG PO CAPS
290.0000 ug | ORAL_CAPSULE | Freq: Every day | ORAL | Status: DC
  Administered 2022-07-23 – 2022-07-25 (×3): 290 ug via ORAL
  Filled 2022-07-22 (×4): qty 2

## 2022-07-22 MED ORDER — ALBUTEROL SULFATE HFA 108 (90 BASE) MCG/ACT IN AERS: 2.0000 | INHALATION_SPRAY | RESPIRATORY_TRACT | Status: DC | PRN

## 2022-07-22 MED ORDER — PREGABALIN 100 MG PO CAPS
300.0000 mg | ORAL_CAPSULE | Freq: Two times a day (BID) | ORAL | Status: DC
  Administered 2022-07-22 – 2022-07-25 (×6): 300 mg via ORAL
  Filled 2022-07-22 (×6): qty 3

## 2022-07-22 MED ORDER — INSULIN NPH (HUMAN) (ISOPHANE) 100 UNIT/ML ~~LOC~~ SUSP
45.0000 [IU] | Freq: Every day | SUBCUTANEOUS | Status: DC
  Filled 2022-07-22: qty 10

## 2022-07-22 MED ORDER — ASPIRIN 81 MG PO TBEC
81.0000 mg | DELAYED_RELEASE_TABLET | Freq: Every day | ORAL | Status: DC
  Filled 2022-07-22: qty 1

## 2022-07-22 MED ORDER — FUROSEMIDE 10 MG/ML IJ SOLN
40.0000 mg | Freq: Every day | INTRAMUSCULAR | Status: DC
  Administered 2022-07-22 – 2022-07-23 (×2): 40 mg via INTRAVENOUS
  Filled 2022-07-22 (×2): qty 4

## 2022-07-22 MED ORDER — ASPIRIN 81 MG PO TBEC
81.0000 mg | DELAYED_RELEASE_TABLET | Freq: Every day | ORAL | Status: DC
  Administered 2022-07-23 – 2022-07-25 (×3): 81 mg via ORAL
  Filled 2022-07-22 (×3): qty 1

## 2022-07-22 MED ORDER — ONDANSETRON HCL 4 MG/2ML IJ SOLN: 4.0000 mg | Freq: Four times a day (QID) | INTRAMUSCULAR | Status: DC | PRN

## 2022-07-22 MED ORDER — IPRATROPIUM-ALBUTEROL 0.5-2.5 (3) MG/3ML IN SOLN
3.0000 mL | Freq: Three times a day (TID) | RESPIRATORY_TRACT | Status: DC
  Administered 2022-07-22: 3 mL via RESPIRATORY_TRACT
  Filled 2022-07-22: qty 3

## 2022-07-22 MED ORDER — LORAZEPAM 0.5 MG PO TABS
0.5000 mg | ORAL_TABLET | Freq: Three times a day (TID) | ORAL | Status: DC
  Administered 2022-07-23 – 2022-07-25 (×8): 0.5 mg via ORAL
  Filled 2022-07-22 (×8): qty 1

## 2022-07-22 MED ORDER — POTASSIUM CHLORIDE CRYS ER 20 MEQ PO TBCR
40.0000 meq | EXTENDED_RELEASE_TABLET | Freq: Two times a day (BID) | ORAL | Status: DC
  Administered 2022-07-22 – 2022-07-23 (×3): 40 meq via ORAL
  Filled 2022-07-22 (×3): qty 2

## 2022-07-22 MED ORDER — INSULIN ASPART 100 UNIT/ML IJ SOLN
0.0000 [IU] | Freq: Every day | INTRAMUSCULAR | Status: DC
  Administered 2022-07-22: 0 [IU] via SUBCUTANEOUS

## 2022-07-22 MED ORDER — VENLAFAXINE HCL ER 75 MG PO CP24
150.0000 mg | ORAL_CAPSULE | Freq: Two times a day (BID) | ORAL | Status: DC
  Administered 2022-07-22 – 2022-07-25 (×6): 150 mg via ORAL
  Filled 2022-07-22 (×6): qty 2

## 2022-07-22 MED ORDER — ROSUVASTATIN CALCIUM 20 MG PO TABS
20.0000 mg | ORAL_TABLET | Freq: Every day | ORAL | Status: DC
  Filled 2022-07-22: qty 1

## 2022-07-22 MED ORDER — INSULIN ISOPHANE HUMAN 100 UNIT/ML KWIKPEN: 45.0000 [IU] | PEN_INJECTOR | SUBCUTANEOUS | Status: DC

## 2022-07-22 MED ORDER — BUPRENORPHINE HCL 300 MCG BU FILM: 300.0000 ug | ORAL_FILM | Freq: Two times a day (BID) | BUCCAL | Status: DC

## 2022-07-22 MED ORDER — PANTOPRAZOLE SODIUM 40 MG PO TBEC
40.0000 mg | DELAYED_RELEASE_TABLET | Freq: Every day | ORAL | Status: DC
  Administered 2022-07-23 – 2022-07-25 (×3): 40 mg via ORAL
  Filled 2022-07-22 (×3): qty 1

## 2022-07-22 MED ORDER — PANTOPRAZOLE SODIUM 40 MG PO TBEC
40.0000 mg | DELAYED_RELEASE_TABLET | Freq: Every day | ORAL | Status: DC
  Filled 2022-07-22: qty 1

## 2022-07-22 MED ORDER — FESOTERODINE FUMARATE ER 4 MG PO TB24
4.0000 mg | ORAL_TABLET | Freq: Every day | ORAL | Status: DC
  Administered 2022-07-23 – 2022-07-25 (×3): 4 mg via ORAL
  Filled 2022-07-22 (×4): qty 1

## 2022-07-22 MED ORDER — ZIPRASIDONE HCL 20 MG PO CAPS
60.0000 mg | ORAL_CAPSULE | Freq: Every day | ORAL | Status: DC
  Administered 2022-07-22 – 2022-07-24 (×3): 60 mg via ORAL
  Filled 2022-07-22 (×4): qty 3

## 2022-07-22 MED ORDER — SODIUM CHLORIDE 0.9 % IV BOLUS
1000.0000 mL | Freq: Once | INTRAVENOUS | Status: AC
  Administered 2022-07-22: 1000 mL via INTRAVENOUS

## 2022-07-22 MED ORDER — HEPARIN (PORCINE) 25000 UT/250ML-% IV SOLN
2000.0000 [IU]/h | INTRAVENOUS | Status: AC
  Administered 2022-07-22: 1550 [IU]/h via INTRAVENOUS
  Administered 2022-07-23: 2000 [IU]/h via INTRAVENOUS
  Filled 2022-07-22 (×2): qty 250

## 2022-07-22 MED ORDER — ZIPRASIDONE HCL 40 MG PO CAPS
60.0000 mg | ORAL_CAPSULE | Freq: Every day | ORAL | Status: DC
  Filled 2022-07-22: qty 1

## 2022-07-22 MED ORDER — ACETAMINOPHEN 650 MG RE SUPP: 650.0000 mg | Freq: Four times a day (QID) | RECTAL | Status: DC | PRN

## 2022-07-22 MED ORDER — ACETAMINOPHEN 325 MG PO TABS
650.0000 mg | ORAL_TABLET | Freq: Four times a day (QID) | ORAL | Status: DC | PRN
  Administered 2022-07-23 – 2022-07-25 (×4): 650 mg via ORAL
  Filled 2022-07-22 (×4): qty 2

## 2022-07-22 MED ORDER — INSULIN ASPART 100 UNIT/ML IJ SOLN
0.0000 [IU] | Freq: Three times a day (TID) | INTRAMUSCULAR | Status: DC
  Administered 2022-07-23 – 2022-07-24 (×2): 3 [IU] via SUBCUTANEOUS

## 2022-07-22 MED ORDER — HEPARIN BOLUS VIA INFUSION
5800.0000 [IU] | Freq: Once | INTRAVENOUS | Status: AC
  Administered 2022-07-22: 5800 [IU] via INTRAVENOUS
  Filled 2022-07-22: qty 5800

## 2022-07-22 MED ORDER — ONDANSETRON HCL 4 MG PO TABS: 4.0000 mg | ORAL_TABLET | Freq: Four times a day (QID) | ORAL | Status: DC | PRN

## 2022-07-22 MED ORDER — MIRABEGRON ER 25 MG PO TB24
25.0000 mg | ORAL_TABLET | Freq: Every day | ORAL | Status: DC
  Administered 2022-07-23 – 2022-07-25 (×3): 25 mg via ORAL
  Filled 2022-07-22 (×3): qty 1

## 2022-07-22 MED ORDER — FAMOTIDINE 20 MG PO TABS
40.0000 mg | ORAL_TABLET | Freq: Every day | ORAL | Status: DC
  Administered 2022-07-23 – 2022-07-25 (×3): 40 mg via ORAL
  Filled 2022-07-22 (×3): qty 2

## 2022-07-22 NOTE — H&P (Signed)
History and Physical    Patient: [name] ZOX:[id number] DOB: 19-Jan-1956 DOA: 07/22/2022 DOS: the patient was seen and examined on 07/22/2022 PCP: Tammie Logan, PA  Patient coming from: Home  Chief Complaint:  Chief Complaint  Patient presents with   Shortness of Breath       HPI:  [name] is a 67 y.o. F with morbid obesity, asthma, OHS/OSA, dCHF, well controlled schizophrenia, chronic pain on daily opiates, CKD IIIb baseline 1.3-1.6, hypothyroidism, and bedbound at baseline who presented with wheezing, shortness of breath and hypoxia.  Patient was in usual state of health (sedentary and really not able to walk, but no dyspnea at rest) when she woke this morning short of breath and wheezing.  She also noted leg swelling and discomfort.  Has chronic right leg weakness.   EMS found her with SpO2 80s and tachypneic, applied NRB.  In the ER, CXR with congestion and CM, Cr up to 2.5 (from recent baseline ~2.2) and d-dimer 1.6.  She ambulated on room air but desaturated to high 80s.  Admitted for rule out PE.          Review of Systems  Constitutional:  Negative for chills, fever and malaise/fatigue.  Respiratory:  Positive for shortness of breath and wheezing. Negative for cough, hemoptysis and sputum production.   Cardiovascular:  Positive for leg swelling. Negative for chest pain, palpitations, orthopnea, claudication and PND.  Musculoskeletal:  Negative for back pain, falls, joint pain, myalgias and neck pain.  Neurological:  Positive for weakness. Negative for dizziness, tingling, tremors, sensory change, speech change, focal weakness, seizures, loss of consciousness and headaches.  All other systems reviewed and are negative.    Past Medical History:  Diagnosis Date   Anemia    Anxiety and depression    Asthma    Breast cancer (HCC) 02/13/2012   ruq  100'clock bx Ductal Carcinoma in Situ,(0/1) lymph node neg, treated with radiation   Chronic lower back pain     CKD (chronic kidney disease), stage III (HCC)    "lower stage" (01/06/2014)   Gait abnormality 05/29/2016   GERD (gastroesophageal reflux disease)    History of hiatal hernia    History of stomach ulcers    HSV (herpes simplex virus) infection    Hyperlipemia    Hypertension    sees Dr. 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   partial   benign cyst  Left    removed from left breast   BREAST BIOPSY Right 02/13/2012   BREAST LUMPECTOMY  03/03/2012   Procedure: LUMPECTOMY;  Surgeon: Tammie Paris, MD;  Location: [facility] OR;  Service: General;  Laterality: Right;   BREAST SURGERY Right 01/2012   "cancer"   CHOLECYSTECTOMY  1980's?   COLONOSCOPY WITH PROPOFOL N/A 09/28/2012   Procedure: COLONOSCOPY WITH PROPOFOL;  Surgeon: Shirley Friar, MD;  Location: [facility] ENDOSCOPY;  Service: Endoscopy;  Laterality: N/A;   FOOT FRACTURE SURGERY Right 1990's   JOINT REPLACEMENT      x 3   LESION EXCISION N/A 03/13/2021   Procedure:  EXCISION SCALP SEBACEOUS CYSTS x2;  Surgeon: Axel Filler, MD;  Location: [facility] OR;  Service: General;  Laterality: N/A;  60 MINUTES LOCAL & MAC   LUMBAR LAMINECTOMY/DECOMPRESSION MICRODISCECTOMY N/A 12/05/2016   Procedure: LEFT L5-S1 MICRODISCECTOMY;  Surgeon: Eldred Manges, MD;  Location: [facility] OR;  Service: Orthopedics;  Laterality: N/A;   REVISION TOTAL KNEE ARTHROPLASTY  07/2009   SHOULDER OPEN ROTATOR CUFF REPAIR Left 1990's   THYROIDECTOMY  1970's   TOTAL KNEE ARTHROPLASTY Bilateral 2009-06/2009   left; right   Social History: Lives alone, has aides that come into the home, essentially unable to walk for several  years.   reports that she quit smoking about 30 years ago. Her smoking use included cigarettes. She has a 7.00 pack-year smoking history. She has never used smokeless tobacco. She reports that she does not drink alcohol and does not use drugs.  Allergies  Allergen Reactions   Bee Venom Anaphylaxis   Invokana [Canagliflozin] Anaphylaxis    Dyspnea and urinary retention   Shellfish Allergy Anaphylaxis   Buprenorphine Hcl Other (See Comments)    Overly sedated with morphine drip.   Other Other (See Comments)    Cats - bad asthma, stopped up    Family History  Problem Relation Age of Onset   Heart disease Brother        Multiple MIs, starting in his 53s   Diabetes Brother    Thyroid disease Brother    Hypertension Mother    Diabetes Mother    Breast cancer Mother 39   Bone cancer Mother    Hypertension Sister    Diabetes Sister    Breast cancer Sister 28   Thyroid disease Sister    Breast cancer Maternal Grandmother    Heart disease Maternal Grandmother    Uterine cancer Other 19   Breast cancer Paternal Aunt 50   Breast cancer Paternal Grandmother        dx in her 32s   Prostate cancer Paternal Grandfather    Asthma Son     Prior to Admission medications   Medication Sig Start Date End Date Taking? Authorizing Provider  albuterol (VENTOLIN HFA) 108 (90 Base) MCG/ACT inhaler Inhale 2 puffs into the lungs every 4 (four) hours as needed for wheezing or shortness of breath.    Yes [provider]  aspirin EC 81 MG tablet Take 1 tablet (81 mg total) by mouth daily. 08/12/13  Yes Viyuoh, Adeline C, MD  BELBUCA 300 MCG FILM Take 300 mcg by mouth 2 (two) times daily. 09/13/21  Yes Amellia Panik, Earl Lites, MD  Cholecalciferol (VITAMIN D-3) 1000 units CAPS Take 1,000 Units by mouth daily.   Yes [provider]  esomeprazole (NEXIUM) 40 MG capsule Take 1 capsule (40 mg total) by mouth daily before breakfast. 10/30/14  Yes Vassie Loll, MD  famotidine (PEPCID) 40 MG  tablet Take 40 mg by mouth daily after supper. 06/16/20  Yes [provider]  ferrous sulfate 325 (65 FE) MG tablet Take 325 mg by mouth in the morning.   Yes [provider]  furosemide (LASIX) 20 MG tablet Take 2 tablets (40 mg total) by mouth daily. Patient taking differently: Take 60 mg by mouth in the morning. 09/09/19  Yes Romero Belling, MD  Insulin NPH, Human,, Isophane, (HUMULIN N KWIKPEN) 100 UNIT/ML Kiwkpen Inject 45 Units into the skin every morning. And pen needles 1/day 05/23/22  Yes Carlus Pavlov, MD  levothyroxine (SYNTHROID) 200 MCG tablet Take 1 tablet (200 mcg  total) by mouth daily before breakfast. 04/25/22  Yes Carlus Pavlov, MD  linaclotide [facility]) 290 MCG CAPS capsule Take 290 mcg by mouth daily before breakfast.   Yes [provider]  LORazepam (ATIVAN) 1 MG tablet Take 1 tablet (1 mg total) by mouth in the morning, at noon, and at bedtime. 09/13/21  Yes Dallas Scorsone, Earl Lites, MD  mirabegron ER (MYRBETRIQ) 25 MG TB24 tablet Take 25 mg by mouth in the morning.   Yes [provider]  polyvinyl alcohol (LIQUIFILM TEARS) 1.4 % ophthalmic solution 1 drop as needed for dry eyes.   Yes [provider]  potassium chloride SA (KLOR-CON) 20 MEQ tablet Take 20 mEq by mouth daily. 06/22/20  Yes [provider]  pregabalin (LYRICA) 300 MG capsule Take 300 mg by mouth 2 (two) times daily.   Yes [provider]  rosuvastatin (CRESTOR) 20 MG tablet Take 20 mg by mouth daily.   Yes [provider]  sucralfate (CARAFATE) 1 g tablet Take 1 g by mouth in the morning. 08/13/20  Yes [provider]  tolterodine (DETROL LA) 2 MG 24 hr capsule Take 2 mg by mouth every evening. 09/24/20  Yes [provider]  valACYclovir (VALTREX) 1000 MG tablet Take 1,000 mg by mouth daily.   Yes [provider]  venlafaxine XR (EFFEXOR-XR) 150 MG 24 hr capsule Take 150 mg by mouth 2 (two) times daily. 05/21/15  Yes  [provider]  ziprasidone (GEODON) 60 MG capsule Take 60 mg by mouth daily after supper.   Yes [provider]  acetaminophen (TYLENOL) 650 MG CR tablet Take 1,300 mg by mouth every 8 (eight) hours as needed for pain. Patient not taking: Reported on 07/22/2022    [provider]  Dulaglutide (TRULICITY) 4.5 MG/0.5ML SOPN Inject 4.5 mg into the skin once a week. 10/17/21   Carlus Pavlov, MD  glucose blood (ONETOUCH VERIO) test strip TEST BLOOD SUGAR THREE TIMES DAILY 03/12/22   Carlus Pavlov, MD  Lancets [facility] DELICA PLUS [place]) MISC 1 each by Other route 3 (three) times daily. Use to check blood sugar 3 times a day 05/23/22   Carlus Pavlov, MD  naloxone [facility]) nasal spray 4 mg/0.1 mL Place 1 spray into the nose as needed for opioid reversal. 08/01/20   [provider]  SURE COMFORT PEN NEEDLES 32G X 6 MM MISC USE TO INJECT INSULIN FOUR TIMES DAILY 03/13/21   Romero Belling, MD    Physical Exam: Vitals:   07/22/22 1900 07/22/22 2000 07/22/22 2035 07/22/22 2036  BP: 124/65     Pulse: 79     Resp:      Temp: 98 F (36.7 C)     TempSrc: Oral     SpO2: 97%  100% 100%  Weight:  (!) 159 kg     General appearance: Obese adult female, appears sleepy and tired, but alert and in no distress.  Responds to questions.  But appears more somnolent than baseline, unable to sit up from bed somewhat disheveled and lacking in hygiene. HEENT:  Anicteric, conjunctivae and sclerae normal without injection or icterus, lids and lashes normal.  Visual tracking smooth.  OP moist without lesions, lips normal, normal auditory acuity   Cor: Heart rate seems normal, no murmurs are auscultated although heart sounds are extremely muffled, the JVP is not visible due to body habitus, she has brawny changes to both lower extremities, but I do feel that they are edematous, possibly the right more than  the left  Resp: Respiratory rate at this time at rest seems normal but that  she does sound out of breath, lung sounds are essentially not audible given her adipose, I do not appreciate substantial wheezing.   Abd:  No TTP or rebound all quadrants.  No masses would be palpable on exam.   No obvious ascites or distention.  MSK: Symmetrical without gross deformities of the hands, large joints, or legs. Skin:  cap refill normal, Skin intact without significant rashes or lesions.  Very small buttocks pressure wound. Neuro:  Speech is slow but fluent.  Naming is grossly intact, patient's recall, both recent and remote, seem within normal limits.  Muscle tone diminished globally, maybe mild right leg weakness which she says is old for several months, face symmetric, ataxia not noted  Psych: Psychomotor slowing is noted, affect blunted, thought content seems appropriate, speech seems slowed but thought processes are linear.           Data Reviewed: Basic metabolic panel shows hyponatremia, hypokalemia, and worsening renal function LFTs are normal Troponins are negative BNP is falsely low CBC shows chronic anemia VBG is unremarkable Chest x-ray is personally reviewed, shows artifact of obesity, probably some venous congestion and obvious cardiomegaly D-dimer is elevated COVID is negative EKG personally reviewed, voltages are almost undetectable, but I do not see ST depression.       Assessment and Plan: * Hypoxia Admission requested for hypoxia, although at the time of last discharge, she was diagnosed with chronic respiratory failure and discharged on home oxygen, which she states she did not get.  Also did not follow-up with pulmonology as recommended.  Here, her hypoxia I suspect is her baseline.  She does have new sensation of dyspnea which prompted 9-1-1 call, and the etiology of that is not clear.  I suspect it is from the holy trinity of obesity hypoventilation, diastolic HF and asthma.  VBG normal.  CHF/volume status impossible to assess.  PE was suspected by  ER provider, and d-dimer elevated. - Obtain VQ scan - Obtain LE dopplers - Start empiric heparin gtt --> if VQ negative, will stop  - See below re: CHF - See below re: asthma -Hold Ativan, Belbuca until tomorrow    Chronic respiratory failure with hypoxia (HCC) Last seen by Pulm in 2018, diagnosed with moderate asthma and obesity related lung disease at that time, had pHTN by echo at that time.  No follow up since.  Back to requiring O2 at rest during hospitalization in July 2023, diuresed, initially improved, then creatinine increased, and was discharged on O2.    Obesity hypoventilation syndrome (HCC) - CPAP at night - Really needs pulmonology follow-up after discharge - Medically unsafe for this patient to be on high-dose buprenorphine and Ativan  Chronic diastolic CHF (congestive heart failure) (HCC) Echo 2018 showed grade I DD and mild pHTN.  Echo July 2023 showed still normal EF, moderate AS.  Diuresed at that time and initially improved, until creatinine bumped.  Here, CXR with cardiomegaly and vascular congestion.  BNP unreliable.  Fluid status not possible to assess clinically due to obesity.   - Challenge with Lasix 40 IV  - Potassium - Foley given severe obesity/immobility and diuresis - Strict I/Os    Mild persistent asthma FEV1 50% in 2018.  Diagnosed with moderate asthma by Pulm at that time.  No wheezing on exam today, but exam severely limited by habitus.  No prior admissions for asthma.  No prior courses of  steroids that I see.  Not on inhalers at baseline. - Start pulmicort  - Schedule bronchodilators    Stage 3b chronic kidney disease (CKD) (HCC) Creatinine baseline used to be 1.4-1.6, but more recent creatinine with her PCP was 2.2 back in March.  Here it is 2.5, and I suspect this is progression of her renal disease, not AKI although I am uncertain - Strict ins and outs - Urinalysis - Daily BMP  Hypokalemia - Supplement  potassium  Hyponatremia Mild, asymptomatic - Lasix and trend BMP  Schizophrenia (HCC) - Continue Geodon, venlafaxine - Continue Ativan, lower dose  Chronic pain syndrome - Continue home Belbuca, Linzess -Continue Lyrica  Obesity, morbid, BMI 50 or higher (HCC) BMI greater than 50  OSA on CPAP - Continue home CPAP  Type II diabetes mellitus (HCC) Hemoglobin A1c 6% 3 months ago - Hold home Trulicity, Crestor - Continue home NPH - Sliding scale corrections  Anemia due to chronic kidney disease Hemoglobin stable relative to baseline 9-10 -Hold home iron  Essential hypertension Blood pressure normal - Continue Lasix in IV form  HLD (hyperlipidemia) - Continue home Crestor  Hypothyroidism TSH 8 in March - Repeat TSH, free T4 - Continue home levothyroxine         Advance Care Planning: Full code, confirmed with patient  Consults: None needed  Family Communication: None present  Severity of Illness: Patient presents with dyspnea.  There was concern for hypoxia, but this is likely her baseline  We will get a VQ scan tomorrow, and if this and the Dopplers are negative, I suspect she will be able to discharge with home oxygen tomorrow afternoon  The appropriate patient status for this patient is OBSERVATION. Observation status is judged to be reasonable and necessary in order to provide the required intensity of service to ensure the patient's safety. The patient's presenting symptoms, physical exam findings, and initial radiographic and laboratory data in the context of their medical condition is felt to place them at decreased risk for further clinical deterioration. Furthermore, it is anticipated that the patient will be medically stable for discharge from the hospital within 2 midnights of admission.   Author: Alberteen Sam, MD 07/22/2022 8:41 PM  For on call review [email or web address].

## 2022-07-22 NOTE — Assessment & Plan Note (Signed)
Hemoglobin A1c 6% 3 months ago - Hold home Trulicity, Crestor - Continue home NPH - Sliding scale corrections

## 2022-07-22 NOTE — Assessment & Plan Note (Signed)
Mild, asymptomatic - Lasix and trend BMP

## 2022-07-22 NOTE — Assessment & Plan Note (Signed)
TSH 8 in March - Repeat TSH, free T4 - Continue home levothyroxine

## 2022-07-22 NOTE — Assessment & Plan Note (Signed)
Hemoglobin stable relative to baseline 9-10 -Hold home iron

## 2022-07-22 NOTE — Assessment & Plan Note (Addendum)
Echo 2018 showed grade I DD and mild pHTN.  Echo July 2023 showed still normal EF, moderate AS.  Diuresed at that time and initially improved, until creatinine bumped.  Here, CXR with cardiomegaly and vascular congestion.  BNP unreliable.  Fluid status not possible to assess clinically due to obesity.   - Challenge with Lasix 40 IV  - Potassium - Foley given severe obesity/immobility and diuresis - Strict I/Os

## 2022-07-22 NOTE — Assessment & Plan Note (Signed)
-   Continue Geodon, venlafaxine - Continue Ativan, lower dose

## 2022-07-22 NOTE — Assessment & Plan Note (Signed)
Creatinine baseline used to be 1.4-1.6, but more recent creatinine with her PCP was 2.2 back in March.  Here it is 2.5, and I suspect this is progression of her renal disease, not AKI although I am uncertain - Strict ins and outs - Urinalysis - Daily BMP

## 2022-07-22 NOTE — Progress Notes (Signed)
ANTICOAGULATION CONSULT NOTE - Initial Consult  Pharmacy Consult for heparin Indication: suspected pulmonary embolus  Allergies  Allergen Reactions   Bee Venom Anaphylaxis   Invokana [Canagliflozin] Anaphylaxis    Dyspnea and urinary retention   Shellfish Allergy Anaphylaxis   Buprenorphine Hcl Other (See Comments)    Overly sedated with morphine drip.   Other Other (See Comments)    Cats - bad asthma, stopped up    Patient Measurements:   Heparin Dosing Weight: 97.5 kg DW= 0.3 x [TBW - (1.25 x IBW)] + (1.25 x IBW)  IBW: 57 kg Vital Signs: Temp: 98 F (36.7 C) (06/04 1900) Temp Source: Oral (06/04 1900) BP: 124/65 (06/04 1900) Pulse Rate: 79 (06/04 1900)  Labs: Recent Labs    07/22/22 1124 07/22/22 1443 07/22/22 1732  HGB 9.3*  --  9.5*  HCT 29.6*  --  28.0*  PLT 201  --   --   CREATININE 2.58*  --   --   TROPONINIHS 5 4  --     CrCl cannot be calculated (Unknown ideal weight.).   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 Pharmacy has been consulted to dose heparin for VTE. D-dimer elevated at 1.61, but given her sCr she cannot undergo CTA and will need V/Q perfusion scanning. No PTA anticoagulant, Hgb low at 9.6, plts 200, no signs/symptoms of bleeding reported.  Goal of Therapy:  Heparin level 0.3-0.7 units/ml Monitor platelets by anticoagulation protocol: Yes   Plan:  Give 5800 units bolus x 1 Start heparin infusion at 1550 units/hr Check anti-Xa level in 6 hours and daily while on heparin Continue to monitor H&H and platelets  Arabella Merles, PharmD. [facility] Acute Care PGY-1 07/22/2022 8:50 PM

## 2022-07-22 NOTE — Assessment & Plan Note (Addendum)
Blood pressure normal - Continue Lasix in IV form

## 2022-07-22 NOTE — Assessment & Plan Note (Signed)
Continue home CPAP ?

## 2022-07-22 NOTE — ED Notes (Signed)
ED TO INPATIENT HANDOFF REPORT  ED Nurse Name and Phone #:  Johnna Acosta [phone]  S Name/Age/Gender [name] 67 y.o. female Room/Bed: 038C/038C  Code Status   Code Status: Full Code  Home/SNF/Other Home Patient oriented to: self, place, time, and situation Is this baseline? Yes   Triage Complete: Triage complete  Chief Complaint Hypoxia [R09.02]  Triage Note Pt BIBGEMS from home with c/o Sob and low oxygen saturation with weakness. Pt reports this AM upon waking was wheezing and short of breath, took inhaler with no improvement. Per Fire on arrival pt RA was 86% and put her on nonrebreather. 92-94% for EMS and was placed on 4 LPM, increased her oxygen to 98%. Typically a lift assist and wheelchair bound  Hx Asthma  BP 120/68 HR 84 CBG 108    Allergies Allergies  Allergen Reactions   Bee Venom Anaphylaxis   Invokana [Canagliflozin] Anaphylaxis    Dyspnea and urinary retention   Shellfish Allergy Anaphylaxis   Buprenorphine Hcl Other (See Comments)    Overly sedated with morphine drip.   Other Other (See Comments)    Cats - bad asthma, stopped up    Level of Care/Admitting Diagnosis ED Disposition     ED Disposition  Admit   Condition  --   Comment  Hospital Area: [facility] [[id number]]  Level of Care: Telemetry Medical [104]  May place patient in observation at [facility] or [facility] if equivalent level of care is available:: Yes  Covid Evaluation: Confirmed COVID Negative  Diagnosis: Hypoxia [300808]  Admitting Physician: Alberteen Sam [[id number]]  Attending Physician: Alberteen Sam [[id number]]          B Medical/Surgery History Past Medical History:  Diagnosis Date   Anemia    Anxiety and depression    Asthma    Breast cancer (HCC) 02/13/2012   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   partial   benign cyst  Left    removed from left breast   BREAST BIOPSY Right 02/13/2012   BREAST LUMPECTOMY  03/03/2012   Procedure: LUMPECTOMY;  Surgeon: Currie Paris, MD;  Location: [facility] OR;  Service: General;  Laterality: Right;   BREAST SURGERY Right 01/2012   "cancer"   CHOLECYSTECTOMY  1980's?   COLONOSCOPY WITH PROPOFOL N/A 09/28/2012   Procedure: COLONOSCOPY WITH PROPOFOL;  Surgeon: Shirley Friar, MD;  Location: [facility];  Service: Endoscopy;  Laterality: N/A;   FOOT FRACTURE SURGERY Right 1990's   JOINT REPLACEMENT      x 3   LESION EXCISION N/A 03/13/2021  Procedure: EXCISION SCALP SEBACEOUS CYSTS x2;  Surgeon: Axel Filler, MD;  Location: [facility] OR;  Service: General;  Laterality: N/A;  60 MINUTES LOCAL & MAC   LUMBAR LAMINECTOMY/DECOMPRESSION MICRODISCECTOMY N/A 12/05/2016   Procedure: LEFT L5-S1 MICRODISCECTOMY;  Surgeon: Eldred Manges, MD;  Location: [facility] OR;  Service: Orthopedics;  Laterality: N/A;   REVISION TOTAL KNEE ARTHROPLASTY  07/2009   SHOULDER OPEN ROTATOR CUFF REPAIR Left 1990's   THYROIDECTOMY  1970's   TOTAL KNEE ARTHROPLASTY Bilateral 2009-06/2009   left; right     A IV Location/Drains/Wounds Patient Lines/Drains/Airways Status     Active Line/Drains/Airways     Name Placement date Placement time Site Days    Peripheral IV 07/22/22 20 G 1.88" Anterior;Left;Upper Arm 07/22/22  1116  Arm  less than 1   Urethral Catheter Durelle Zepeda RN Straight-tip 14 Fr. 07/22/22  2025  Straight-tip  less than 1            Intake/Output Last 24 hours  Intake/Output Summary (Last 24 hours) at 07/22/2022 2231 Last data filed at 07/22/2022 1713 Gross per 24 hour  Intake 1000 ml  Output --  Net 1000 ml    Labs/Imaging Results for orders placed or performed during the hospital encounter of 07/22/22 (from the past 48 hour(s))  SARS Coronavirus 2 by RT PCR (hospital order, performed in [facility] hospital lab) *cepheid single result test* Anterior Nasal Swab     Status: None   Collection Time: 07/22/22 10:52 AM   Specimen: Anterior Nasal Swab  Result Value Ref Range   SARS Coronavirus 2 by RT PCR NEGATIVE NEGATIVE    Comment: Performed at [facility] Lab, [address] [place], [place] [address]  CBC with Differential     Status: Abnormal   Collection Time: 07/22/22 11:24 AM  Result Value Ref Range   WBC 7.6 4.0 - 10.5 K/uL   RBC 3.58 (L) 3.87 - 5.11 MIL/uL   Hemoglobin 9.3 (L) 12.0 - 15.0 g/dL   HCT 60.4 (L) 54.0 - 98.1 %   MCV 82.7 80.0 - 100.0 fL   MCH 26.0 26.0 - 34.0 pg   MCHC 31.4 30.0 - 36.0 g/dL   RDW 19.1 (H) 47.8 - 29.5 %   Platelets 201 150 - 400 K/uL   nRBC 0.0 0.0 - 0.2 %   Neutrophils Relative % 80 %   Neutro Abs 6.1 1.7 - 7.7 K/uL   Lymphocytes Relative 10 %   Lymphs Abs 0.7 0.7 - 4.0 K/uL   Monocytes Relative 7 %   Monocytes Absolute 0.5 0.1 - 1.0 K/uL   Eosinophils Relative 2 %   Eosinophils Absolute 0.2 0.0 - 0.5 K/uL   Basophils Relative 1 %   Basophils Absolute 0.1 0.0 - 0.1 K/uL   Immature Granulocytes 0 %   Abs Immature Granulocytes 0.03 0.00 - 0.07 K/uL    Comment: Performed at [name], [address] [place], [place] [address]  Comprehensive metabolic panel     Status: Abnormal   Collection Time: 07/22/22 11:24 AM  Result Value Ref Range   Sodium 132 (L)  135 - 145 mmol/L   Potassium 3.3 (L) 3.5 - 5.1 mmol/L   Chloride 96 (L) 98 - 111 mmol/L   CO2 22 22 - 32 mmol/L   Glucose, Bld 104 (H) 70 - 99 mg/dL    Comment: Glucose reference range applies only to samples taken after fasting for at least 8 hours.   BUN 47 (H)  8 - 23 mg/dL   Creatinine, Ser 4.09 (H) 0.44 - 1.00 mg/dL   Calcium 9.1 8.9 - 81.1 mg/dL   Total Protein 6.1 (L) 6.5 - 8.1 g/dL   Albumin 3.5 3.5 - 5.0 g/dL   AST 15 15 - 41 U/L   ALT 14 0 - 44 U/L   Alkaline Phosphatase 73 38 - 126 U/L   Total Bilirubin 0.4 0.3 - 1.2 mg/dL   GFR, Estimated 20 (L) >60 mL/min    Comment: (NOTE) Calculated using the CKD-EPI Creatinine Equation (2021)    Anion gap 14 5 - 15    Comment: Performed at [facility], [address], [place], [place] [address]  Brain natriuretic peptide     Status: None   Collection Time: 07/22/22 11:24 AM  Result Value Ref Range   B Natriuretic Peptide 50.4 0.0 - 100.0 pg/mL    Comment: Performed at [facility], [address], [place], [place] [address]  Troponin I (High Sensitivity)     Status: None   Collection Time: 07/22/22 11:24 AM  Result Value Ref Range   Troponin I (High Sensitivity) 5 <18 ng/L    Comment: (NOTE) Elevated high sensitivity troponin I (hsTnI) values and significant  changes across serial measurements may suggest ACS but many other  chronic and acute conditions are known to elevate hsTnI results.  Refer to the "Links" section for chest pain algorithms and additional  guidance. Performed at [facility], [address], [place], [place] [address]   D-dimer, quantitative     Status: Abnormal   Collection Time: 07/22/22  2:33 PM  Result Value Ref Range   D-Dimer, Quant 1.61 (H) 0.00 - 0.50 ug/mL-FEU    Comment: (NOTE) At the manufacturer cut-off value of 0.5 g/mL FEU, this assay has a negative predictive value of 95-100%.This assay is intended for use in conjunction with a clinical pretest probability (PTP)  assessment model to exclude pulmonary embolism (PE) and deep venous thrombosis (DVT) in outpatients suspected of PE or DVT. Results should be correlated with clinical presentation. Performed at [facility] Lab, [address] [place], [place] [address]   Troponin I (High Sensitivity)     Status: None   Collection Time: 07/22/22  2:43 PM  Result Value Ref Range   Troponin I (High Sensitivity) 4 <18 ng/L    Comment: (NOTE) Elevated high sensitivity troponin I (hsTnI) values and significant  changes across serial measurements may suggest ACS but many other  chronic and acute conditions are known to elevate hsTnI results.  Refer to the "Links" section for chest pain algorithms and additional  guidance. Performed at [facility] Lab, [address], [place], [place] [address]   Magnesium     Status: None   Collection Time: 07/22/22  2:43 PM  Result Value Ref Range   Magnesium 2.0 1.7 - 2.4 mg/dL    Comment: Performed at [facility] Lab, [address], [place], [place] [address]  I-Stat venous blood gas, ED     Status: Abnormal   Collection Time: 07/22/22  5:32 PM  Result Value Ref Range   pH, Ven 7.312 7.25 - 7.43   pCO2, Ven 59.5 44 - 60 mmHg   pO2, Ven 39 32 - 45 mmHg   Bicarbonate 30.1 (H) 20.0 - 28.0 mmol/L   TCO2 32 22 - 32 mmol/L   O2 Saturation 67 %   Acid-Base Excess 3.0 (H) 0.0 - 2.0  mmol/L   Sodium 136 135 - 145 mmol/L   Potassium 3.4 (L) 3.5 - 5.1 mmol/L   Calcium, Ion 1.16 1.15 - 1.40 mmol/L   HCT 28.0 (L) 36.0 - 46.0 %   Hemoglobin 9.5 (L) 12.0 - 15.0 g/dL   Sample type VENOUS    Comment NOTIFIED PHYSICIAN   Urinalysis, Routine w reflex microscopic -Urine, Clean Catch     Status: Abnormal   Collection Time: 07/22/22  8:31 PM  Result Value Ref Range   Color, Urine COLORLESS (A) YELLOW   APPearance CLEAR CLEAR   Specific Gravity, Urine 1.005 1.005 - 1.030   pH 5.0 5.0 - 8.0   Glucose, UA NEGATIVE NEGATIVE mg/dL   Hgb urine dipstick NEGATIVE NEGATIVE    Bilirubin Urine NEGATIVE NEGATIVE   Ketones, ur NEGATIVE NEGATIVE mg/dL   Protein, ur NEGATIVE NEGATIVE mg/dL   Nitrite NEGATIVE NEGATIVE   Leukocytes,Ua NEGATIVE NEGATIVE    Comment: Performed at [facility], [address], [place], [place] [address]   *Note: Due to a large number of results and/or encounters for the requested time period, some results have not been displayed. A complete set of results can be found in Results Review.   DG Chest Portable 1 View  Result Date: 07/22/2022 CLINICAL DATA:  Shortness of breath.  Low O2 sat EXAM: PORTABLE CHEST 1 VIEW COMPARISON:  X-ray 09/06/2021. FINDINGS: Surgical clips at thoracic inlet. Enlarged cardiopericardial silhouette with vascular congestion. No pneumothorax, effusion or edema. Overlapping cardiac leads. Eventration of the diaphragm. IMPRESSION: Enlarged heart with vascular congestion. Electronically Signed   By: Karen Kays M.D.   On: 07/22/2022 12:34    Pending Labs Unresulted Labs (From admission, onward)     Start     Ordered   07/24/22 0500  Heparin level (unfractionated)  Daily,   R      07/22/22 2050   07/23/22 0500  HIV Antibody (routine testing w rflx)  (HIV Antibody (Routine testing w reflex) panel)  Tomorrow morning,   R        07/22/22 2119   07/23/22 0500  Basic metabolic panel  Tomorrow morning,   R        07/22/22 2119   07/23/22 0500  TSH  Tomorrow morning,   R        07/22/22 2029   07/23/22 0500  T4, free  Tomorrow morning,   R        07/22/22 2030   07/23/22 0500  CBC  Daily,   R      07/22/22 2050   07/23/22 0330  Heparin level (unfractionated)  Once-Timed,   TIMED        07/22/22 2049            Vitals/Pain Today's Vitals   07/22/22 2000 07/22/22 2035 07/22/22 2036 07/22/22 2130  BP:    131/69  Pulse:    70  Resp:    13  Temp:      TempSrc:      SpO2:  100% 100% 100%  Weight: (!) 159 kg     Height: 5\' 5"  (1.651 m)     PainSc:    7     Isolation Precautions No active  isolations  Medications Medications  Buprenorphine HCl FILM 300 mcg (300 mcg Oral Not Given 07/22/22 2216)  LORazepam (ATIVAN) tablet 0.5 mg (has no administration in time range)  venlafaxine XR (EFFEXOR-XR) 24 hr capsule 150 mg (150 mg Oral Given 07/22/22 2130)  levothyroxine (SYNTHROID) tablet 200 mcg (has no administration in time range)  linaclotide (LINZESS) capsule 290 mcg (has no administration in time range)  famotidine (PEPCID) tablet 40 mg (has no administration in time range)  mirabegron ER (MYRBETRIQ) tablet 25 mg (has no administration in time range)  fesoterodine (TOVIAZ) tablet 4 mg (4 mg Oral Not Given 07/22/22 2215)  pregabalin (LYRICA) capsule 300 mg (300 mg Oral Given 07/22/22 2130)  insulin aspart (novoLOG) injection 0-15 Units (has no administration in time range)  insulin aspart (novoLOG) injection 0-5 Units (has no administration in time range)  furosemide (LASIX) injection 40 mg (40 mg Intravenous Given 07/22/22 1847)  acetaminophen (TYLENOL) tablet 650 mg (has no administration in time range)    Or  acetaminophen (TYLENOL) suppository 650 mg (has no administration in time range)  ondansetron (ZOFRAN) tablet 4 mg (has no administration in time range)    Or  ondansetron (ZOFRAN) injection 4 mg (has no administration in time range)  potassium chloride SA (KLOR-CON M) CR tablet 40 mEq (40 mEq Oral Given 07/22/22 2130)  budesonide (PULMICORT) nebulizer solution 0.5 mg (0.5 mg Nebulization Given 07/22/22 2034)  ipratropium-albuterol (DUONEB) 0.5-2.5 (3) MG/3ML nebulizer solution 3 mL (3 mLs Nebulization Given 07/22/22 2034)  heparin bolus via infusion 5,800 Units (has no administration in time range)  heparin ADULT infusion 100 units/mL (25000 units/252mL) (has no administration in time range)  aspirin EC tablet 81 mg (has no administration in time range)  albuterol (PROVENTIL) (2.5 MG/3ML) 0.083% nebulizer solution 2.5 mg (has no administration in time range)  pantoprazole (PROTONIX)  EC tablet 40 mg (has no administration in time range)  rosuvastatin (CRESTOR) tablet 20 mg (has no administration in time range)  insulin NPH Human (NOVOLIN N) injection 45 Units (has no administration in time range)  ziprasidone (GEODON) capsule 60 mg (has no administration in time range)  sodium chloride 0.9 % bolus 1,000 mL (0 mLs Intravenous Stopped 07/22/22 1713)    Mobility walks with device     Focused Assessments Cardiac Assessment Handoff:    Lab Results  Component Value Date   CKTOTAL 277 (H) 08/31/2021   CKMB 1.9 06/04/2011   TROPONINI <0.03 02/29/2016   Lab Results  Component Value Date   DDIMER 1.61 (H) 07/22/2022   Does the Patient currently have chest pain? No    R Recommendations: See Admitting Provider Note  Report given to:   Additional Notes:  Pt with small healing wound on R buttock. Would benefit from a bari-bed

## 2022-07-22 NOTE — Assessment & Plan Note (Addendum)
Admission requested for hypoxia, although at the time of last discharge, she was diagnosed with chronic respiratory failure and discharged on home oxygen, which she states she did not get.  Also did not follow-up with pulmonology as recommended.  Here, her hypoxia I suspect is her baseline.  She does have new sensation of dyspnea which prompted 9-1-1 call, and the etiology of that is not clear.  I suspect it is from the holy trinity of obesity hypoventilation, diastolic HF and asthma.  VBG normal.  CHF/volume status impossible to assess.  PE was suspected by ER provider, and d-dimer elevated. - Obtain VQ scan - Obtain LE dopplers - Start empiric heparin gtt --> if VQ negative, will stop  - See below re: CHF - See below re: asthma -Hold Ativan, [place] until tomorrow

## 2022-07-22 NOTE — Assessment & Plan Note (Addendum)
Last seen by Pulm in 2018, diagnosed with moderate asthma and obesity related lung disease at that time, had pHTN by echo at that time.  No follow up since.  Back to requiring O2 at rest during hospitalization in July 2023, diuresed, initially improved, then creatinine increased, and was discharged on O2.

## 2022-07-22 NOTE — ED Provider Notes (Signed)
[place] EMERGENCY DEPARTMENT AT [facility] Provider Note   CSN: [id number] Arrival date & time: 07/22/22  1041     History  Chief Complaint  Patient presents with   Shortness of Breath    [name] is a 67 y.o. female with a past medical history significant for asthma, hypertension, breast cancer, diabetes, hyperlipidemia, and schizophrenia who presents to the ED due to shortness of breath and generalized weakness that started this morning.  Patient was in her normal state of health yesterday and woke up short of breath this morning.  Upon EMS arrival patient found to be hypoxic in the upper 80s.  Not on chronic oxygen.  Patient used albuterol inhaler prior to arrival with some improvement in shortness of breath.  She also endorses generalized weakness.  No unilateral weakness.  Denies visual and speech changes.  Denies nausea, vomiting, diarrhea.  She notes she has been eating and drinking normally.  History of breast cancer.  No history of blood clots.  Admits to lower extremity edema.  Currently on Lasix.  No history of CHF.  History obtained from patient and past medical records. No interpreter used during encounter.       Home Medications Prior to Admission medications   Medication Sig Start Date End Date Taking? Authorizing Provider  acetaminophen (TYLENOL) 650 MG CR tablet Take 1,300 mg by mouth every 8 (eight) hours as needed for pain.    [provider]  albuterol (VENTOLIN HFA) 108 (90 Base) MCG/ACT inhaler Inhale 2 puffs into the lungs every 4 (four) hours as needed for wheezing or shortness of breath.     [provider]  Ascorbic Acid (VITAMIN C PO) Take 1 tablet by mouth in the morning.    [provider]  aspirin EC 81 MG tablet Take 1 tablet (81 mg total) by mouth daily. 08/12/13   Viyuoh, Rolland Bimler, MD  BELBUCA 300 MCG FILM Take 300 mcg by mouth 2 (two) times daily. 09/13/21   Danford, Earl Lites, MD  belladonna-opium (B&O  SUPPRETTES) 16.2-30 MG suppository Place 1 suppository rectally 2 (two) times daily as needed (for bladder spasms). 11/14/20   Molpus, John, MD  cephALEXin (KEFLEX) 500 MG capsule Take 1 capsule (500 mg total) by mouth 2 (two) times daily. 04/07/22   Benjiman Core, MD  Cholecalciferol (VITAMIN D-3) 1000 units CAPS Take 1,000 Units by mouth daily.    [provider]  Cyanocobalamin (VITAMIN B-12 PO) Take 1 tablet by mouth daily.    [provider]  Dulaglutide (TRULICITY) 4.5 MG/0.5ML SOPN Inject 4.5 mg into the skin once a week. 10/17/21   Carlus Pavlov, MD  esomeprazole (NEXIUM) 40 MG capsule Take 1 capsule (40 mg total) by mouth daily before breakfast. 10/30/14   Vassie Loll, MD  famotidine (PEPCID) 40 MG tablet Take 40 mg by mouth daily after supper. 06/16/20   [provider]  ferrous sulfate 325 (65 FE) MG tablet Take 325 mg by mouth in the morning.    [provider]  furosemide (LASIX) 20 MG tablet Take 2 tablets (40 mg total) by mouth daily. Patient taking differently: Take 60 mg by mouth in the morning. 09/09/19   Romero Belling, MD  glucose blood (ONETOUCH VERIO) test strip TEST BLOOD SUGAR THREE TIMES DAILY 03/12/22   Carlus Pavlov, MD  Insulin NPH, Human,, Isophane, (HUMULIN N KWIKPEN) 100 UNIT/ML Kiwkpen Inject 45 Units into the skin every morning. And pen needles 1/day 05/23/22   Gherghe,  Silvestre Mesi, MD  Lancets [facility] DELICA PLUS [place]) MISC 1 each by Other route 3 (three) times daily. Use to check blood sugar 3 times a day 05/23/22   Carlus Pavlov, MD  levothyroxine (SYNTHROID) 200 MCG tablet Take 1 tablet (200 mcg total) by mouth daily before breakfast. 04/25/22   Carlus Pavlov, MD  linaclotide (LINZESS) 290 MCG CAPS capsule Take 290 mcg by mouth daily before breakfast.    [provider]  LORazepam (ATIVAN) 1 MG tablet Take 1 tablet (1 mg total) by mouth in the morning, at noon, and at bedtime. 09/13/21   Danford, Earl Lites, MD  lubiprostone (AMITIZA) 24 MCG capsule Take 24 mcg by mouth 2 (two) times daily. 06/22/20   [provider]  mirabegron ER (MYRBETRIQ) 25 MG TB24 tablet Take 25 mg by mouth in the morning.    [provider]  naloxone [facility]) nasal spray 4 mg/0.1 mL Place 1 spray into the nose as needed for opioid reversal. 08/01/20   [provider]  polyvinyl alcohol (LIQUIFILM TEARS) 1.4 % ophthalmic solution 1 drop as needed for dry eyes.    [provider]  potassium chloride SA (KLOR-CON) 20 MEQ tablet Take 20 mEq by mouth daily. 06/22/20   [provider]  pregabalin (LYRICA) 300 MG capsule Take 300 mg by mouth 2 (two) times daily.    [provider]  rosuvastatin (CRESTOR) 20 MG tablet Take 20 mg by mouth daily.    [provider]  sucralfate (CARAFATE) 1 g tablet Take 1 g by mouth in the morning. 08/13/20   [provider]  SURE COMFORT PEN NEEDLES 32G X 6 MM MISC USE TO INJECT INSULIN FOUR TIMES DAILY 03/13/21   Romero Belling, MD  tolterodine (DETROL LA) 2 MG 24 hr capsule Take 2 mg by mouth every evening. 09/24/20   [provider]  valACYclovir (VALTREX) 1000 MG tablet Take 1,000 mg by mouth daily.    [provider]  venlafaxine XR (EFFEXOR-XR) 150 MG 24 hr capsule Take 150 mg by mouth 2 (two) times daily. 05/21/15   [provider]  ziprasidone (GEODON) 60 MG capsule Take 60 mg by mouth daily after supper.    [provider]      Allergies    Bee venom, Invokana [canagliflozin], Shellfish allergy, Buprenorphine hcl, and Other    Review of Systems   Review of Systems  Constitutional:  Negative for chills and fever.  Respiratory:  Positive for shortness of breath. Negative for cough.   Cardiovascular:  Positive for leg swelling. Negative for chest pain.  Gastrointestinal:  Negative for abdominal pain, diarrhea, nausea and vomiting.    Physical Exam Updated Vital Signs BP 119/75   Pulse 73    Temp 98 F (36.7 C) (Oral)   Resp 15   SpO2 100%  Physical Exam Vitals and nursing note reviewed.  Constitutional:      General: She is not in acute distress.    Appearance: She is not ill-appearing.  HENT:     Head: Normocephalic.  Eyes:     Pupils: Pupils are equal, round, and reactive to light.  Cardiovascular:     Rate and Rhythm: Normal rate and regular rhythm.     Pulses: Normal pulses.     Heart sounds: Normal heart sounds. No murmur heard.    No friction rub. No gallop.  Pulmonary:     Effort: Pulmonary effort is normal.     Breath sounds: Normal breath sounds.  Comments: On 2L [place] Abdominal:     General: Abdomen is flat. There is no distension.     Palpations: Abdomen is soft.     Tenderness: There is no abdominal tenderness. There is no guarding or rebound.  Musculoskeletal:        General: Normal range of motion.     Cervical back: Neck supple.     Comments: Bilateral lower extremity edema  Skin:    General: Skin is warm and dry.  Neurological:     General: No focal deficit present.     Mental Status: She is alert.  Psychiatric:        Mood and Affect: Mood normal.        Behavior: Behavior normal.     ED Results / Procedures / Treatments   Labs (all labs ordered are listed, but only abnormal results are displayed) Labs Reviewed  CBC WITH DIFFERENTIAL/PLATELET - Abnormal; Notable for the following components:      Result Value   RBC 3.58 (*)    Hemoglobin 9.3 (*)    HCT 29.6 (*)    RDW 16.8 (*)    All other components within normal limits  COMPREHENSIVE METABOLIC PANEL - Abnormal; Notable for the following components:   Sodium 132 (*)    Potassium 3.3 (*)    Chloride 96 (*)    Glucose, Bld 104 (*)    BUN 47 (*)    Creatinine, Ser 2.58 (*)    Total Protein 6.1 (*)    GFR, Estimated 20 (*)    All other components within normal limits  SARS CORONAVIRUS 2 BY RT PCR  BRAIN NATRIURETIC PEPTIDE  URINALYSIS, ROUTINE W REFLEX MICROSCOPIC   MAGNESIUM  D-DIMER, QUANTITATIVE  TROPONIN I (HIGH SENSITIVITY)  TROPONIN I (HIGH SENSITIVITY)    EKG EKG Interpretation  Date/Time:  Tuesday July 22 2022 10:50:07 EDT Ventricular Rate:  80 PR Interval:  195 QRS Duration: 94 QT Interval:  405 QTC Calculation: 468 R Axis:   -42 Text Interpretation: Sinus rhythm Left axis deviation Low voltage, precordial leads Abnormal R-wave progression, early transition Consider anterior infarct Nonspecific T abnormalities, lateral leads No significant change since last tracing Confirmed by Jacalyn Lefevre [phone]) on 07/22/2022 10:56:53 AM  Radiology DG Chest Portable 1 View  Result Date: 07/22/2022 CLINICAL DATA:  Shortness of breath.  Low O2 sat EXAM: PORTABLE CHEST 1 VIEW COMPARISON:  X-ray 09/06/2021. FINDINGS: Surgical clips at thoracic inlet. Enlarged cardiopericardial silhouette with vascular congestion. No pneumothorax, effusion or edema. Overlapping cardiac leads. Eventration of the diaphragm. IMPRESSION: Enlarged heart with vascular congestion. Electronically Signed   By: Karen Kays M.D.   On: 07/22/2022 12:34    Procedures Ultrasound ED Peripheral IV (Provider)  Date/Time: 07/22/2022 11:15 AM  Performed by: Mannie Stabile, PA-C Authorized by: Mannie Stabile, PA-C   Procedure details:    Indications: hydration     Skin Prep: chlorhexidine gluconate     Location:  Left AC   Bedside Ultrasound Guided: Yes     Images: not archived     Patient tolerated procedure without complications: Yes     Dressing applied: Yes   Comments:     2nd attempt successful with help of RN     Medications Ordered in ED Medications  sodium chloride 0.9 % bolus 1,000 mL (1,000 mLs Intravenous New Bag/Given 07/22/22 1430)    ED Course/ Medical Decision Making/ A&P Clinical Course as of 07/22/22 1518  Tue Jul 22, 2022  1239  Reassessed patient.  Patient resting comfortably in bed.  On 2 L nasal cannula.  O2 saturation at 96%. [CA]  1239  Creatinine(!): 2.58 [CA]  1239 BUN(!): 47 [CA]  1239 Potassium(!): 3.3 [CA]  1509 Waiting on Ddimer and admission. Cannot get CTA chest, creatinine too elevated. Needs admission, hypoxic and has AKI. Shortness of breath, generalized weakness, hypoxic in upper 80s, history of asthma, lower extremity edema, bnp normal.  [CG]  1510 If elevated dimer --> heparin. Hx breast cancer. sedentary [CG]    Clinical Course User Index [CA] Mannie Stabile, PA-C [CG] Al Decant, PA-C                             Medical Decision Making Amount and/or Complexity of Data Reviewed Independent Historian: EMS Labs: ordered. Decision-making details documented in ED Course. Radiology: ordered and independent interpretation performed. Decision-making details documented in ED Course. ECG/medicine tests: ordered and independent interpretation performed. Decision-making details documented in ED Course.   This patient presents to the ED for concern of SOB/generalized weakness, this involves an extensive number of treatment options, and is a complaint that carries with it a high risk of complications and morbidity.  The differential diagnosis includes asthma exacerbation, PNA, CHF, PE, ACS, etc  67 year old female presents to the ED due to shortness of breath and generalized weakness that started this morning.  Upon EMS arrival patient found to be hypoxic in the upper 80s.  History of asthma.  Used her albuterol inhaler prior to arrival.  Also admits to increased lower extremity edema.  No history of CHF.  On Lasix.  History of breast cancer.  No history of blood clots.  Upon arrival patient afebrile.  Hypoxic to 87% with good waveform.  Patient placed on 2 L nasal cannula.  Lungs clear to auscultation bilaterally.  No wheeze.  Bilateral lower extremity edema.  BNP to rule out CHF.  Chest x-ray to rule out evidence of pneumonia.  Routine labs ordered.  COVID to rule out infection.  CTA chest given history of  breast cancer to rule out PE.  Discussed with Dr. Particia Nearing who evaluated patient during initial evaluation and agrees with assessment and plan.  CBC with hemoglobin at 9.3 which appears to be around patient's baseline.  No leukocytosis.  BMP normal. Troponin normal.  EKG demonstrates normal sinus rhythm.  Low suspicion for ACS.  COVID-negative.  Chest x-ray personally reviewed and interpreted which demonstrates cardiomegaly and vascular congestion.  CMP with AKI with creatinine of 2.58 and BUN of 47.  Potassium 3.3.  Magnesium added.  Patient unable to get CTA chest due to renal function.  D-dimer ordered.  Patient handed off to Jannifer Hick, PA-C at shift change pending d-dimer and admission.         Final Clinical Impression(s) / ED Diagnoses Final diagnoses:  Hypoxia  AKI (acute kidney injury) [facility])    Rx / DC Orders ED Discharge Orders     None         Jesusita Oka 07/22/22 1519    Jacalyn Lefevre, MD 07/31/22 1003

## 2022-07-22 NOTE — Assessment & Plan Note (Signed)
-   Continue home Belbuca, Linzess -Continue Lyrica

## 2022-07-22 NOTE — ED Triage Notes (Signed)
Pt BIBGEMS from home with c/o Sob and low oxygen saturation with weakness. Pt reports this AM upon waking was wheezing and short of breath, took inhaler with no improvement. Per Fire on arrival pt RA was 86% and put her on nonrebreather. 92-94% for EMS and was placed on 4 LPM, increased her oxygen to 98%. Typically a lift assist and wheelchair bound  Hx Asthma  BP 120/68 HR 84 CBG 108

## 2022-07-22 NOTE — Assessment & Plan Note (Addendum)
-   Continue home Crestor °

## 2022-07-22 NOTE — Assessment & Plan Note (Signed)
BMI greater than 50

## 2022-07-22 NOTE — ED Provider Notes (Signed)
  Physical Exam  BP (!) 116/58   Pulse 68   Temp 97.6 F (36.4 C) (Oral)   Resp (!) 8   SpO2 100%   Physical Exam Vitals and nursing note reviewed.  Constitutional:      General: She is not in acute distress.    Appearance: She is well-developed.  HENT:     Head: Normocephalic and atraumatic.  Eyes:     Conjunctiva/sclera: Conjunctivae normal.  Cardiovascular:     Rate and Rhythm: Normal rate and regular rhythm.     Heart sounds: No murmur heard. Pulmonary:     Effort: Pulmonary effort is normal. No respiratory distress.     Breath sounds: Normal breath sounds.     Comments: On 2L [place] Abdominal:     Palpations: Abdomen is soft.     Tenderness: There is no abdominal tenderness.  Musculoskeletal:        General: No swelling.     Cervical back: Neck supple.     Right lower leg: Edema present.     Left lower leg: Edema present.  Skin:    General: Skin is warm and dry.     Capillary Refill: Capillary refill takes less than 2 seconds.  Neurological:     Mental Status: She is alert.  Psychiatric:        Mood and Affect: Mood normal.     Procedures  Procedures  ED Course / MDM   Clinical Course as of 07/22/22 1732  Tue Jul 22, 2022  1239 Reassessed patient.  Patient resting comfortably in bed.  On 2 L nasal cannula.  O2 saturation at 96%. [CA]  1239 Creatinine(!): 2.58 [CA]  1239 BUN(!): 47 [CA]  1239 Potassium(!): 3.3 [CA]  1509 Waiting on Ddimer and admission. Cannot get CTA chest, creatinine too elevated. Needs admission, hypoxic and has AKI. Shortness of breath, generalized weakness, hypoxic in upper 80s, history of asthma, lower extremity edema, bnp normal.  [CG]  1510 If elevated dimer --> heparin. Hx breast cancer. sedentary [CG]  1719 Danford  [CG]    Clinical Course User Index [CA] Mannie Stabile, PA-C [CG] Al Decant, PA-C   Medical Decision Making Amount and/or Complexity of Data Reviewed Labs: ordered. Decision-making details documented  in ED Course. Radiology: ordered.  Risk Decision regarding hospitalization.   Patient signed out to me at shift change pending D-dimer.  Please see previous provider note for further details.  In short this is a 67 year old female who presents to the ED for evaluation of shortness of breath.  Symptoms began this morning.  Patient has new AKI and is hypoxic in the upper 80s with ambulation.  The patient was signed out to me pending a D-dimer.  The D-dimer is elevated however the patient creatinine will not allow her to undergo CT angiogram testing so she will need V/Q perfusion scanning which will require admission.  Discussed this with Dr. Maryfrances Bunnell, [facility] hospitalist, who has agreed to admit the patient.  We appreciate [facility] assistance.      Al Decant, PA-C 07/22/22 1732    Lorre Nick, MD 07/23/22 [phone]

## 2022-07-22 NOTE — Assessment & Plan Note (Signed)
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  Diagnosed with moderate asthma by Pulm at that time.  No wheezing on exam today, but exam severely limited by habitus.  No prior admissions for asthma.  No prior courses of steroids that I see.  Not on inhalers at baseline. - Start pulmicort  - Schedule bronchodilators

## 2022-07-22 NOTE — Hospital Course (Addendum)
[name] is a 67 y.o. F with morbid obesity, asthma, OHS/OSA, dCHF, well controlled schizophrenia, chronic pain on daily opiates, CKD IIIb baseline 1.3-1.6, hypothyroidism, and bedbound at baseline who presented with wheezing, shortness of breath and hypoxia.  Patient was in usual state of health (sedentary and really not able to walk, but no dyspnea at rest) when she woke this morning short of breath and wheezing.  She also noted leg swelling and discomfort.  Has chronic right leg weakness.   EMS found her with SpO2 80s and tachypneic, applied NRB.  In the ER, CXR with congestion and CM, Cr up to 2.5 (from recent baseline ~2.2) and d-dimer 1.6.  She ambulated on room air but desaturated to high 80s.  Admitted for rule out PE.

## 2022-07-22 NOTE — Assessment & Plan Note (Signed)
-   CPAP at night - Really needs pulmonology follow-up after discharge - Medically unsafe for this patient to be on high-dose buprenorphine and Ativan

## 2022-07-22 NOTE — Assessment & Plan Note (Signed)
Treated and resolved 

## 2022-07-23 ENCOUNTER — Observation Stay (HOSPITAL_COMMUNITY): Payer: 59

## 2022-07-23 DIAGNOSIS — L249 Irritant contact dermatitis, unspecified cause: Secondary | ICD-10-CM | POA: Diagnosis present

## 2022-07-23 DIAGNOSIS — G894 Chronic pain syndrome: Secondary | ICD-10-CM | POA: Diagnosis present

## 2022-07-23 DIAGNOSIS — E785 Hyperlipidemia, unspecified: Secondary | ICD-10-CM | POA: Diagnosis present

## 2022-07-23 DIAGNOSIS — I272 Pulmonary hypertension, unspecified: Secondary | ICD-10-CM | POA: Diagnosis present

## 2022-07-23 DIAGNOSIS — E1142 Type 2 diabetes mellitus with diabetic polyneuropathy: Secondary | ICD-10-CM | POA: Diagnosis present

## 2022-07-23 DIAGNOSIS — I130 Hypertensive heart and chronic kidney disease with heart failure and stage 1 through stage 4 chronic kidney disease, or unspecified chronic kidney disease: Secondary | ICD-10-CM | POA: Diagnosis present

## 2022-07-23 DIAGNOSIS — N179 Acute kidney failure, unspecified: Secondary | ICD-10-CM | POA: Diagnosis present

## 2022-07-23 DIAGNOSIS — Z7401 Bed confinement status: Secondary | ICD-10-CM | POA: Diagnosis not present

## 2022-07-23 DIAGNOSIS — E662 Morbid (severe) obesity with alveolar hypoventilation: Secondary | ICD-10-CM | POA: Diagnosis present

## 2022-07-23 DIAGNOSIS — M7989 Other specified soft tissue disorders: Secondary | ICD-10-CM

## 2022-07-23 DIAGNOSIS — E11649 Type 2 diabetes mellitus with hypoglycemia without coma: Secondary | ICD-10-CM | POA: Diagnosis not present

## 2022-07-23 DIAGNOSIS — Z794 Long term (current) use of insulin: Secondary | ICD-10-CM | POA: Diagnosis not present

## 2022-07-23 DIAGNOSIS — R0602 Shortness of breath: Secondary | ICD-10-CM | POA: Diagnosis present

## 2022-07-23 DIAGNOSIS — E1122 Type 2 diabetes mellitus with diabetic chronic kidney disease: Secondary | ICD-10-CM | POA: Diagnosis present

## 2022-07-23 DIAGNOSIS — D631 Anemia in chronic kidney disease: Secondary | ICD-10-CM | POA: Diagnosis present

## 2022-07-23 DIAGNOSIS — E871 Hypo-osmolality and hyponatremia: Secondary | ICD-10-CM | POA: Diagnosis present

## 2022-07-23 DIAGNOSIS — J9621 Acute and chronic respiratory failure with hypoxia: Secondary | ICD-10-CM | POA: Diagnosis present

## 2022-07-23 DIAGNOSIS — N1832 Chronic kidney disease, stage 3b: Secondary | ICD-10-CM | POA: Diagnosis present

## 2022-07-23 DIAGNOSIS — Z6841 Body Mass Index (BMI) 40.0 and over, adult: Secondary | ICD-10-CM | POA: Diagnosis not present

## 2022-07-23 DIAGNOSIS — J4530 Mild persistent asthma, uncomplicated: Secondary | ICD-10-CM | POA: Diagnosis present

## 2022-07-23 DIAGNOSIS — F209 Schizophrenia, unspecified: Secondary | ICD-10-CM | POA: Diagnosis present

## 2022-07-23 DIAGNOSIS — I060 Rheumatic aortic stenosis: Secondary | ICD-10-CM | POA: Diagnosis present

## 2022-07-23 DIAGNOSIS — I5033 Acute on chronic diastolic (congestive) heart failure: Secondary | ICD-10-CM | POA: Diagnosis present

## 2022-07-23 DIAGNOSIS — E890 Postprocedural hypothyroidism: Secondary | ICD-10-CM | POA: Diagnosis present

## 2022-07-23 DIAGNOSIS — E876 Hypokalemia: Secondary | ICD-10-CM | POA: Diagnosis present

## 2022-07-23 DIAGNOSIS — R0902 Hypoxemia: Secondary | ICD-10-CM | POA: Diagnosis not present

## 2022-07-23 DIAGNOSIS — Z1152 Encounter for screening for COVID-19: Secondary | ICD-10-CM | POA: Diagnosis not present

## 2022-07-23 LAB — CBC
HCT: 29.1 % — ABNORMAL LOW (ref 36.0–46.0)
Hemoglobin: 9.6 g/dL — ABNORMAL LOW (ref 12.0–15.0)
MCH: 27 pg (ref 26.0–34.0)
MCHC: 33 g/dL (ref 30.0–36.0)
MCV: 81.7 fL (ref 80.0–100.0)
Platelets: 178 10*3/uL (ref 150–400)
RBC: 3.56 MIL/uL — ABNORMAL LOW (ref 3.87–5.11)
RDW: 16.8 % — ABNORMAL HIGH (ref 11.5–15.5)
WBC: 5 10*3/uL (ref 4.0–10.5)
nRBC: 0 % (ref 0.0–0.2)

## 2022-07-23 LAB — T4, FREE: Free T4: 0.95 ng/dL (ref 0.61–1.12)

## 2022-07-23 LAB — HIV ANTIBODY (ROUTINE TESTING W REFLEX): HIV Screen 4th Generation wRfx: NONREACTIVE

## 2022-07-23 LAB — GLUCOSE, CAPILLARY
Glucose-Capillary: 76 mg/dL (ref 70–99)
Glucose-Capillary: 143 mg/dL — ABNORMAL HIGH (ref 70–99)
Glucose-Capillary: 45 mg/dL — ABNORMAL LOW (ref 70–99)
Glucose-Capillary: 91 mg/dL (ref 70–99)
Glucose-Capillary: 154 mg/dL — ABNORMAL HIGH (ref 70–99)
Glucose-Capillary: 132 mg/dL — ABNORMAL HIGH (ref 70–99)

## 2022-07-23 LAB — BASIC METABOLIC PANEL
Anion gap: 12 (ref 5–15)
BUN: 36 mg/dL — ABNORMAL HIGH (ref 8–23)
CO2: 30 mmol/L (ref 22–32)
Calcium: 8.7 mg/dL — ABNORMAL LOW (ref 8.9–10.3)
Chloride: 94 mmol/L — ABNORMAL LOW (ref 98–111)
Creatinine, Ser: 1.79 mg/dL — ABNORMAL HIGH (ref 0.44–1.00)
GFR, Estimated: 31 mL/min — ABNORMAL LOW (ref >60)
Glucose, Bld: 69 mg/dL — ABNORMAL LOW (ref 70–99)
Potassium: 3.3 mmol/L — ABNORMAL LOW (ref 3.5–5.1)
Sodium: 136 mmol/L (ref 135–145)

## 2022-07-23 LAB — HEPARIN LEVEL (UNFRACTIONATED)
Heparin Unfractionated: <0.1 IU/mL — ABNORMAL LOW (ref 0.30–0.70)
Heparin Unfractionated: <0.1 IU/mL — ABNORMAL LOW (ref 0.30–0.70)

## 2022-07-23 LAB — TSH: TSH: 7.961 u[IU]/mL — ABNORMAL HIGH (ref 0.350–4.500)

## 2022-07-23 MED ORDER — INSULIN NPH (HUMAN) (ISOPHANE) 100 UNIT/ML ~~LOC~~ SUSP
25.0000 [IU] | Freq: Every day | SUBCUTANEOUS | Status: DC
  Administered 2022-07-24 – 2022-07-25 (×2): 25 [IU] via SUBCUTANEOUS
  Filled 2022-07-23: qty 10

## 2022-07-23 MED ORDER — IPRATROPIUM-ALBUTEROL 0.5-2.5 (3) MG/3ML IN SOLN
3.0000 mL | Freq: Three times a day (TID) | RESPIRATORY_TRACT | Status: DC
  Administered 2022-07-23: 3 mL via RESPIRATORY_TRACT

## 2022-07-23 MED ORDER — CHLORHEXIDINE GLUCONATE CLOTH 2 % EX PADS
6.0000 | MEDICATED_PAD | Freq: Every day | CUTANEOUS | Status: DC
  Administered 2022-07-23 – 2022-07-25 (×3): 6 via TOPICAL

## 2022-07-23 MED ORDER — IPRATROPIUM-ALBUTEROL 0.5-2.5 (3) MG/3ML IN SOLN
3.0000 mL | Freq: Two times a day (BID) | RESPIRATORY_TRACT | Status: DC
  Administered 2022-07-23 – 2022-07-24 (×2): 3 mL via RESPIRATORY_TRACT
  Filled 2022-07-23 (×4): qty 3

## 2022-07-23 MED ORDER — HEPARIN BOLUS VIA INFUSION
3000.0000 [IU] | Freq: Once | INTRAVENOUS | Status: AC
  Administered 2022-07-23: 3000 [IU] via INTRAVENOUS
  Filled 2022-07-23: qty 3000

## 2022-07-23 MED ORDER — ENOXAPARIN SODIUM 150 MG/ML IJ SOSY
150.0000 mg | PREFILLED_SYRINGE | Freq: Two times a day (BID) | INTRAMUSCULAR | Status: DC
  Administered 2022-07-23 – 2022-07-25 (×4): 150 mg via SUBCUTANEOUS
  Filled 2022-07-23 (×4): qty 1

## 2022-07-23 MED ORDER — HYDROCODONE-ACETAMINOPHEN 5-325 MG PO TABS
1.0000 | ORAL_TABLET | Freq: Once | ORAL | Status: AC
  Administered 2022-07-23: 1 via ORAL
  Filled 2022-07-23: qty 1

## 2022-07-23 MED ORDER — IPRATROPIUM-ALBUTEROL 0.5-2.5 (3) MG/3ML IN SOLN
RESPIRATORY_TRACT | Status: AC
  Filled 2022-07-23: qty 3

## 2022-07-23 NOTE — Progress Notes (Signed)
Pt placed on cpap 

## 2022-07-23 NOTE — Progress Notes (Signed)
   07/23/22 2031  BiPAP/CPAP/SIPAP  BiPAP/CPAP/SIPAP Pt Type Adult  BiPAP/CPAP/SIPAP Resmed  Mask Type Full face mask  Mask Size Large  IPAP  (max 16)  EPAP  (min 6)  Pressure Support 6 cmH20  FiO2 (%) 21 %  Patient Home Equipment No  Auto Titrate Yes  BiPAP/CPAP /SiPAP Vitals  Pulse Rate 86  SpO2 94 %  Bilateral Breath Sounds Diminished

## 2022-07-23 NOTE — Progress Notes (Deleted)
   07/23/22 0022  BiPAP/CPAP/SIPAP  Reason BIPAP/CPAP not in use Other(comment) (PT refused cpap)  BiPAP/CPAP /SiPAP Vitals  Bilateral Breath Sounds Diminished  MEWS Score/Color  MEWS Score 0  MEWS Score Color [name]

## 2022-07-23 NOTE — Progress Notes (Addendum)
PROGRESS NOTE    [name]  ZOX:[id number] DOB: Jan 05, 1956 DOA: 07/22/2022 PCP: Burnice Logan, PA  67/F with morbid obesity, asthma, OSA/OHS, diastolic CHF, schizophrenia, chronic pain, CKD 3, hypothyroidism, with debility and bedbound status presented to the ED with acute hypoglycemia associated with shortness of breath and wheezing.  Woke up yesterday morning feeling poorly, her blood sugar was low, drank some juice, repeat blood sugar was still low, then developed acute shortness of breath. -Called 911 came to the ED where she was noted to be mildly hypoxic and tachypneic.  Labs in the ER noted pulmonary vascular congestion on chest x-ray, creatinine up to 2.5 and abnormal D-dimer   Subjective: -Feels okay, denies any complaints this morning, asking for hospital bed and [facility] lift  Assessment and Plan:  Acute on chronic hypoxic respiratory failure Admission requested for hypoxia, although at the time of last discharge, she was diagnosed with chronic respiratory failure and discharged on home oxygen, which she states she did not get.  Also did not follow-up with pulmonology as recommended. -suspect it is from the holy trinity of obesity hypoventilation, diastolic HF and asthma.  VBG normal.  CHF/volume status impossible to assess.  PE was suspected by ER provider, and d-dimer elevated. - VQ scan still pending, Dopplers negative, continue empiric heparin, clinical suspicion is low  -Needs pulmonary follow-up , does not appear to require O2 at rest at this time, largely nonambulatory as well  Obesity hypoventilation syndrome (HCC) - CPAP at night - needs pulmonology follow-up after discharge - Buprenorphine and Ativan discontinued on admission  Acute on chronic diastolic CHF  -Echo 7/23 with preserved EF, indeterminate diastolic parameters, mild to moderate aortic stenosis  -Here, CXR with cardiomegaly and vascular congestion.  BNP unreliable.  Fluid status not possible to assess  clinically due to obesity.   - Continue IV Lasix 1 more day -Poor candidate for SGLT2 with body habitus and bedbound status -GDMT limited by CKD  Mild persistent asthma FEV1 50% in 2018.  Diagnosed with moderate asthma by Pulm at that time. -Stable, continue Pulmicort and bronchodilators  Stage 3b chronic kidney disease (CKD) (HCC) Creatinine baseline used to be 1.4-1.6, but more recent creatinine with her PCP was 2.2 back in March -Better today, continue to trend  Hypokalemia - Supplement potassium  Hyponatremia Mild, asymptomatic - Lasix and trend BMP  Schizophrenia (HCC) - Continue Geodon, venlafaxine - Continue Ativan, lower dose  Chronic pain syndrome - Continue home Bel[place]uca, Linzess -Continue Lyrica  Obesity, morbid, BMI 50 or higher (HCC) BMI greater than 50  OSA on CPAP - Continue home CPAP  Type II diabetes mellitus (HCC) Hemoglobin A1c 6% 3 months ago - Hold home Trulicity, Crestor - Cut down NPH dose, hypoglycemic on admission - Sliding scale corrections  Anemia due to chronic kidney disease Hemoglobin stable relative to baseline 9-10 -Hold home iron  Essential hypertension Blood pressure normal - Continue Lasix in IV form  HLD (hyperlipidemia) - Continue home Crestor  Hypothyroidism TSH 8 in March - Repeat TSH, free T4 - Continue home levothyroxine        DVT prophylaxis: IV heparin Code Status: Full code Family Communication: No family at bedside Disposition Plan: Home likely tomorrow  Consultants:    Procedures:   Antimicrobials:    Objective: Vitals:   07/22/22 2332 07/23/22 0556 07/23/22 0755 07/23/22 0952  BP: 130/64 114/61  126/71  Pulse: 77 74  75  Resp: 18 18  17   Temp: 97.7 F (36.5 C)  97.9 F (36.6 C)  98.4 F (36.9 C)  TempSrc: Oral Oral    SpO2: 97% 100% 96% 94%  Weight:      Height:        Intake/Output Summary (Last 24 hours) at 07/23/2022 1345 Last data filed at 07/23/2022 0800 Gross per 24 hour   Intake 1300 ml  Output 1900 ml  Net -600 ml   Filed Weights   07/22/22 2000  Weight: (!) 159 kg    Examination:  General exam: Obese chronically ill female sitting up in bed, AAOx3, no distress HEENT: Neck obese unable to assess JVD CVS: S1-S2, regular rhythm Lungs: Distant breath sounds otherwise clear Abdomen: Soft, nontender, bowel sounds present Extremities: Chronic venous stasis, trace edema  Skin: No rashes Psychiatry:  Mood & affect appropriate.     Data Reviewed:   CBC: Recent Labs  Lab 07/22/22 1124 07/22/22 1732 07/23/22 0454  WBC 7.6  --  5.0  NEUTROABS 6.1  --   --   HGB 9.3* 9.5* 9.6*  HCT 29.6* 28.0* 29.1*  MCV 82.7  --  81.7  PLT 201  --  178   Basic Metabolic Panel: Recent Labs  Lab 07/22/22 1124 07/22/22 1443 07/22/22 1732 07/23/22 0454  NA 132*  --  136 136  K 3.3*  --  3.4* 3.3*  CL 96*  --   --  94*  CO2 22  --   --  30  GLUCOSE 104*  --   --  69*  BUN 47*  --   --  36*  CREATININE 2.58*  --   --  1.79*  CALCIUM 9.1  --   --  8.7*  MG  --  2.0  --   --    GFR: Estimated Creatinine Clearance: 47.7 mL/min (A) (by C-G formula based on SCr of 1.79 mg/dL (H)). Liver Function Tests: Recent Labs  Lab 07/22/22 1124  AST 15  ALT 14  ALKPHOS 73  BILITOT 0.4  PROT 6.1*  ALBUMIN 3.5   No results for input(s): "LIPASE", "AMYLASE" in the last 168 hours. No results for input(s): "AMMONIA" in the last 168 hours. Coagulation Profile: No results for input(s): "INR", "PROTIME" in the last 168 hours. Cardiac Enzymes: No results for input(s): "CKTOTAL", "CKMB", "CKMBINDEX", "TROPONINI" in the last 168 hours. BNP (last 3 results) No results for input(s): "PROBNP" in the last 8760 hours. HbA1C: No results for input(s): "HGBA1C" in the last 72 hours. CBG: Recent Labs  Lab 07/22/22 2219 07/22/22 2331 07/23/22 0746 07/23/22 0807 07/23/22 1147  GLUCAP 137* 143* 45* 76 91   Lipid Profile: No results for input(s): "CHOL", "HDL",  "LDLCALC", "TRIG", "CHOLHDL", "LDLDIRECT" in the last 72 hours. Thyroid Function Tests: Recent Labs    07/23/22 0454  TSH 7.961*  FREET4 0.95   Anemia Panel: No results for input(s): "VITAMINB12", "FOLATE", "FERRITIN", "TIBC", "IRON", "RETICCTPCT" in the last 72 hours. Urine analysis:    Component Value Date/Time   COLORURINE COLORLESS (A) 07/22/2022 2031   APPEARANCEUR CLEAR 07/22/2022 2031   APPEARANCEUR Clear 11/13/2013 1310   LABSPEC 1.005 07/22/2022 2031   LABSPEC 1.017 11/13/2013 1310   PHURINE 5.0 07/22/2022 2031   GLUCOSEU NEGATIVE 07/22/2022 2031   GLUCOSEU Negative 11/13/2013 1310   GLUCOSEU NEG mg/dL 40/98/1191 4782   HGBUR NEGATIVE 07/22/2022 2031   BILIRUBINUR NEGATIVE 07/22/2022 2031   BILIRUBINUR Negative 11/13/2013 1310   KETONESUR NEGATIVE 07/22/2022 2031   PROTEINUR NEGATIVE 07/22/2022 2031   UROBILINOGEN 0.2 11/12/2014 2057  NITRITE NEGATIVE 07/22/2022 2031   LEUKOCYTESUR NEGATIVE 07/22/2022 2031   LEUKOCYTESUR Trace 11/13/2013 1310   Sepsis Labs: @LABRCNTIP (procalcitonin:4,lacticidven:4)  ) Recent Results (from the past 240 hour(s))  SARS Coronavirus 2 by RT PCR (hospital order, performed in [facility] hospital lab) *cepheid single result test* Anterior Nasal Swab     Status: None   Collection Time: 07/22/22 10:52 AM   Specimen: Anterior Nasal Swab  Result Value Ref Range Status   SARS Coronavirus 2 by RT PCR NEGATIVE NEGATIVE Final    Comment: Performed at [facility], [address] [place], [place] [address]     Radiology Studies: VAS [place] LOWER EXTREMITY VENOUS (DVT)  Result Date: 07/23/2022  Lower Venous DVT Study Patient Name:  [name]  Date of Exam:   07/23/2022 Medical Rec #: [id number]         Accession #:    [id number] Date of Birth: 18-May-1955         Patient Gender: F Patient Age:   57 years Exam Location:  [facility] Procedure:      VAS [place] LOWER EXTREMITY VENOUS (DVT) Referring Phys: Joen Laura  --------------------------------------------------------------------------------  Indications: New onset SOB, chronic leg swelling RT>LT.  Limitations: Body habitus. Comparison Study: 04-20-2019 Prior right lower extremity venous study was                   negative for DVT. Performing Technologist: Jean Rosenthal RDMS, RVT  Examination Guidelines: A complete evaluation includes B-mode imaging, spectral Doppler, color Doppler, and power Doppler as needed of all accessible portions of each vessel. Bilateral testing is considered an integral part of a complete examination. Limited examinations for reoccurring indications may be performed as noted. The reflux portion of the exam is performed with the patient in reverse Trendelenburg.  +---------+---------------+---------+-----------+----------+---------------+ RIGHT    CompressibilityPhasicitySpontaneityPropertiesThrombus Aging  +---------+---------------+---------+-----------+----------+---------------+ CFV      Full           Yes      Yes                                  +---------+---------------+---------+-----------+----------+---------------+ SFJ      Full                                                         +---------+---------------+---------+-----------+----------+---------------+ FV Prox  Full                                                         +---------+---------------+---------+-----------+----------+---------------+ FV Mid   Full                                                         +---------+---------------+---------+-----------+----------+---------------+ FV Distal               Yes      Yes  Patent by color +---------+---------------+---------+-----------+----------+---------------+ PFV      Full                                                         +---------+---------------+---------+-----------+----------+---------------+ POP      Full           Yes      Yes                                   +---------+---------------+---------+-----------+----------+---------------+ PTV      Full                                                         +---------+---------------+---------+-----------+----------+---------------+ PERO     Full                                                         +---------+---------------+---------+-----------+----------+---------------+   +---------+---------------+---------+-----------+----------+---------------+ LEFT     CompressibilityPhasicitySpontaneityPropertiesThrombus Aging  +---------+---------------+---------+-----------+----------+---------------+ CFV      Full           Yes      Yes                                  +---------+---------------+---------+-----------+----------+---------------+ SFJ      Full                                                         +---------+---------------+---------+-----------+----------+---------------+ FV Prox  Full                                                         +---------+---------------+---------+-----------+----------+---------------+ FV Mid   Full                                                         +---------+---------------+---------+-----------+----------+---------------+ FV Distal               Yes      Yes                  Patent by color +---------+---------------+---------+-----------+----------+---------------+ PFV      Full                                                         +---------+---------------+---------+-----------+----------+---------------+  POP      Partial        Yes      Yes                                  +---------+---------------+---------+-----------+----------+---------------+ PTV      Full                                                         +---------+---------------+---------+-----------+----------+---------------+ PERO     Full                                                          +---------+---------------+---------+-----------+----------+---------------+    Summary: RIGHT: - There is no evidence of deep vein thrombosis in the lower extremity. However, portions of this examination were limited- see technologist comments above.  - No cystic structure found in the popliteal fossa.  LEFT: - There is no evidence of deep vein thrombosis in the lower extremity. However, portions of this examination were limited- see technologist comments above.  - No cystic structure found in the popliteal fossa.  *See table(s) above for measurements and observations.    Preliminary    DG Chest Portable 1 View  Result Date: 07/22/2022 CLINICAL DATA:  Shortness of breath.  Low O2 sat EXAM: PORTABLE CHEST 1 VIEW COMPARISON:  X-ray 09/06/2021. FINDINGS: Surgical clips at thoracic inlet. Enlarged cardiopericardial silhouette with vascular congestion. No pneumothorax, effusion or edema. Overlapping cardiac leads. Eventration of the diaphragm. IMPRESSION: Enlarged heart with vascular congestion. Electronically Signed   By: Karen Kays M.D.   On: 07/22/2022 12:34     Scheduled Meds:  aspirin EC  81 mg Oral Daily   budesonide (PULMICORT) nebulizer solution  0.5 mg Nebulization BID   Buprenorphine HCl  300 mcg Oral BID   Chlorhexidine Gluconate Cloth  6 each Topical Daily   famotidine  40 mg Oral QPC supper   fesoterodine  4 mg Oral Daily   furosemide  40 mg Intravenous Daily   insulin aspart  0-15 Units Subcutaneous TID WC   insulin aspart  0-5 Units Subcutaneous QHS   insulin NPH Human  45 Units Subcutaneous QAC breakfast   ipratropium-albuterol  3 mL Nebulization BID   ipratropium-albuterol       levothyroxine  200 mcg Oral Q0600   linaclotide  290 mcg Oral QAC breakfast   LORazepam  0.5 mg Oral TID   mirabegron ER  25 mg Oral Daily   pantoprazole  40 mg Oral Daily   potassium chloride  40 mEq Oral BID   pregabalin  300 mg Oral BID   rosuvastatin  20 mg Oral Daily   venlafaxine XR  150 mg  Oral BID   ziprasidone  60 mg Oral QPC supper   Continuous Infusions:  heparin 2,000 Units/hr (07/23/22 0659)     LOS: 0 days    Time spent:    Zannie Cove, MD [facility]   07/23/2022, 1:45 PM

## 2022-07-23 NOTE — TOC Initial Note (Signed)
Transition of Care [facility]) - Initial/Assessment Note    Patient Details  Name: [name] MRN: [id number] Date of Birth: 31-May-1955  Transition [facility]) CM/SW Contact:    Tom-Johnson, Hershal Coria, RN Phone Number: 07/23/2022, 2:16 PM  Clinical Narrative:                  CM spoke with patient at bedside about needs for post hospital transition.  Admitted for acute Hypoglycemia, SOB and Wheezing. Found to have AKI and Hypoxia. Currently on Room air, IV Lasix, Heparin gtt, Neb tx and inhalers.  From home alone, has two children, daughter lives locally and supportive. Patient is wheelchair/bed bound. Retired, Land to and from appointments.  Has a wheelchair, shower seat, grab bars, bsc and CPAP.  Has an aide from [employer] 5-7 days a week for 2-3hrs/day.   Awaits PT/OT eval. PTAR to transport at discharge. CM will continue to follow as patient progresses with care towards discharge.          Barriers to Discharge: Continued Medical Work up   Patient Goals and CMS Choice Patient states their goals for this hospitalization and ongoing recovery are:: To return home CMS Medicare.gov Compare Post Acute Care list provided to:: Patient Choice offered to / list presented to : Patient      Expected Discharge Plan and Services   Discharge Planning Services: CM Consult   Living arrangements for the past 2 months: Single Family Home                                      Prior Living Arrangements/Services Living arrangements for the past 2 months: Single Family Home Lives with:: Self Patient language and need for interpreter reviewed:: Yes Do you feel safe going back to the place where you live?: Yes      Need for Family Participation in Patient Care: Yes (Comment) Care giver support system in place?: Yes (comment) Current home services: Homehealth aide (W/c, shower seat, grab bars, bsc) Criminal Activity/Legal Involvement  Pertinent to Current Situation/Hospitalization: No - Comment as needed  Activities of Daily Living Home Assistive Devices/Equipment: Wheelchair, CBG Meter, Scales ADL Screening (condition at time of admission) Patient's cognitive ability adequate to safely complete daily activities?: No Is the patient deaf or have difficulty hearing?: No Does the patient have difficulty seeing, even when wearing glasses/contacts?: No Does the patient have difficulty concentrating, remembering, or making decisions?: No Patient able to express need for assistance with ADLs?: Yes Does the patient have difficulty dressing or bathing?: Yes Independently performs ADLs?: No Communication: Needs assistance Is this a change from baseline?: Pre-admission baseline Dressing (OT): Needs assistance Is this a change from baseline?: Pre-admission baseline Grooming: Needs assistance Is this a change from baseline?: Pre-admission baseline Feeding: Needs assistance, Independent with device (comment) Is this a change from baseline?: Pre-admission baseline Bathing: Dependent Is this a change from baseline?: Pre-admission baseline Toileting: Dependent Is this a change from baseline?: Pre-admission baseline In/Out Bed: Dependent Is this a change from baseline?: Pre-admission baseline Walks in Home: Dependent Is this a change from baseline?: Pre-admission baseline Does the patient have difficulty walking or climbing stairs?: Yes Weakness of Legs: Both Weakness of Arms/Hands: None  Permission Sought/Granted Permission sought to share information with : Case Manager, Family Supports Permission granted to share information with : Yes, Verbal Permission Granted  Emotional Assessment Appearance:: Appears stated age Attitude/Demeanor/Rapport: Engaged, Gracious Affect (typically observed): Accepting, Appropriate, Calm, Hopeful, Pleasant Orientation: : Oriented to Self, Oriented to Place, Oriented to  Time,  Oriented to Situation Alcohol / Substance Use: Not Applicable Psych Involvement: No (comment)  Admission diagnosis:  Hypoxia [R09.02] AKI (acute kidney injury) (HCC) [N17.9] Patient Active Problem List   Diagnosis Date Noted   Chronic diastolic CHF (congestive heart failure) (HCC) 07/22/2022   Hypoxia 07/22/2022   Obesity hypoventilation syndrome (HCC) 07/22/2022   Hypokalemia 07/22/2022   Deficiency of parathyrin (HCC) 01/27/2022   CHF exacerbation (HCC) 09/02/2021   Generalized weakness 08/31/2021   Ambulatory dysfunction 08/31/2021   Chronic respiratory failure with hypoxia (HCC) 08/31/2021   AMS (altered mental status) 09/29/2020   Hyperparathyroidism, secondary (HCC) 03/23/2018   Memory difficulty 12/22/2017   Spinal stenosis 06/09/2017   HNP (herniated nucleus pulposus), lumbar 12/05/2016   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   Diabetic neuropathy (HCC) 09/08/2016   Hypocalcemia 08/22/2016   Abnormal barium swallow 07/07/2016   Anxiety 07/07/2016   History of malignant neoplasm of breast 07/07/2016   Gait abnormality 05/29/2016   Encephalopathy acute 04/23/2016   Hyponatremia 04/23/2016   Schizophrenia, paranoid (HCC)    Aspiration pneumonia of left lower lobe due to gastric secretions (HCC)    Projectile vomiting with nausea    Altered mental status    Schizophrenia (HCC)    Disorganized schizophrenia (HCC)    Chronic pain syndrome    Acute renal failure (HCC)    Acute encephalopathy    Oropharyngeal dysphagia    Aspiration pneumonia of right lower lobe due to vomit (HCC)    Acute renal failure (ARF) (HCC) 03/28/2016   Encephalopathy 03/27/2016   HCAP (healthcare-associated pneumonia) 02/29/2016   Pneumonia 02/14/2016   CAP (community acquired pneumonia) 02/13/2016   Edema 02/13/2016   AKI (acute kidney injury) (HCC) 02/13/2016   Elevated troponin 06/24/2015   Diarrhea 06/23/2015   Sepsis (HCC) 06/23/2015    Obesity, morbid, BMI 50 or higher (HCC) 03/26/2015   Post traumatic stress disorder (PTSD) 11/14/2014   Leukocytosis 11/12/2014   Slurred speech 11/12/2014   Stage 3b chronic kidney disease (CKD) (HCC)    Diabetes mellitus, type II, insulin dependent (HCC)    TIA (transient ischemic attack)    Hypotension 10/28/2014   Syncope 10/28/2014   Anemia due to chronic kidney disease 10/28/2014   Type II diabetes mellitus (HCC) 10/28/2014   OSA on CPAP 10/28/2014   Chronic lower back pain 10/28/2014   Polyneuropathy in diabetes(357.2) 10/28/2014   Diabetic polyneuropathy (HCC)    Fall 08/14/2014   Recurrent falls 08/14/2014   Hot flashes 02/24/2014   Arthralgia 02/24/2014   Hair loss 02/24/2014   Chest pain 01/06/2014   Shock (HCC) 08/08/2013   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   Dysarthria 12/30/2012   Benign paroxysmal positional vertigo 12/30/2012   Breast cancer of upper-outer quadrant of right female breast (HCC) 11/18/2012   Hemorrhage of rectum and anus 09/28/2012   Hx of radiation therapy    Syncope and collapse 06/04/2011   HSV 10/05/2008   Mild persistent asthma 06/02/2008   Dyspnea 07/19/2007   HLD (hyperlipidemia) 06/02/2006   Sinus tachycardia 05/25/2006   Hypothyroidism 02/27/2006   Depression 02/27/2006   Essential hypertension 02/27/2006   GERD 02/27/2006   KNEE PAIN 02/27/2006   PCP:  Burnice Logan, PA Pharmacy:   [facility] - [place], [place] - [address]  [address] [place] [address] Phone: [phone] Fax: [phone]     Social Determinants of Health (SDOH) Social History: SDOH Screenings   Food Insecurity: No Food Insecurity (07/22/2022)  Housing: Low Risk  (07/22/2022)  Transportation Needs: No Transportation Needs (07/22/2022)  Utilities: Not At Risk (07/22/2022)  Tobacco Use: Medium Risk (07/22/2022)   SDOH Interventions: Transportation Interventions: PTAR [facility])   Readmission Risk Interventions     No data to display

## 2022-07-23 NOTE — Progress Notes (Signed)
New Admission Note:   Arrival Method: Stretcher Mental Orientation: alert x 4 Telemetry: box 11 Assessment: Completed Skin: see flowsheet IV: NSL Pain: none Tubes: foley Safety Measures: Safety Fall Prevention Plan has been discussed Admission: Completed [facility] Orientation: Patient has been orientated to the room, unit and staff.  Family: none  Orders have been reviewed and implemented. Will continue to monitor the patient. Call light has been placed within reach and bed alarm has been activated.   Artemio Aly BSN, RN Phone number: [phone] Admission Note:

## 2022-07-23 NOTE — Progress Notes (Signed)
ANTICOAGULATION CONSULT NOTE  Pharmacy Consult for heparin Indication: R/O pulmonary embolus Brief A/P: Heparin level subtherapeutic Increase Heparin rate   Allergies  Allergen Reactions   Bee Venom Anaphylaxis   Invokana [Canagliflozin] Anaphylaxis    Dyspnea and urinary retention   Shellfish Allergy Anaphylaxis   Buprenorphine Hcl Other (See Comments)    Overly sedated with morphine drip.   Other Other (See Comments)    Cats - bad asthma, stopped up    Patient Measurements: Height: 5\' 5"  (165.1 cm) Weight: (!) 159 kg (350 lb 8.5 oz) IBW/kg (Calculated) : 57 Heparin Dosing Weight: 97.5 kg   IBW: 57 kg Vital Signs: Temp: 97.7 F (36.5 C) (06/04 2332) Temp Source: Oral (06/04 2332) BP: 130/64 (06/04 2332) Pulse Rate: 77 (06/04 2332)  Labs: Recent Labs    07/22/22 1124 07/22/22 1443 07/22/22 1732 07/23/22 0454  HGB 9.3*  --  9.5* 9.6*  HCT 29.6*  --  28.0* 29.1*  PLT 201  --   --  178  HEPARINUNFRC  --   --   --  <0.10*  CREATININE 2.58*  --   --   --   TROPONINIHS 5 4  --   --      Estimated Creatinine Clearance: 33.1 mL/min (A) (by C-G formula based on SCr of 2.58 mg/dL (H)).   Assessment: 67 y.o. female with possible PE awaiting VQ scan for heparin  Goal of Therapy:  Heparin level 0.3-0.7 units/ml Monitor platelets by anticoagulation protocol: Yes   Plan:  Heparin 3000 units IV bolus, then increase heparin 2000 units/hr Check heparin level in 6 hours.   Geannie Risen, PharmD, BCPS   07/23/2022 5:42 AM

## 2022-07-24 ENCOUNTER — Inpatient Hospital Stay (HOSPITAL_COMMUNITY): Payer: 59

## 2022-07-24 LAB — CBC
HCT: 29.1 % — ABNORMAL LOW (ref 36.0–46.0)
Hemoglobin: 9.4 g/dL — ABNORMAL LOW (ref 12.0–15.0)
MCH: 26.8 pg (ref 26.0–34.0)
MCHC: 32.3 g/dL (ref 30.0–36.0)
MCV: 82.9 fL (ref 80.0–100.0)
Platelets: 207 10*3/uL (ref 150–400)
RBC: 3.51 MIL/uL — ABNORMAL LOW (ref 3.87–5.11)
RDW: 16.9 % — ABNORMAL HIGH (ref 11.5–15.5)
WBC: 6.3 10*3/uL (ref 4.0–10.5)
nRBC: 0 % (ref 0.0–0.2)

## 2022-07-24 LAB — GLUCOSE, CAPILLARY
Glucose-Capillary: 188 mg/dL — ABNORMAL HIGH (ref 70–99)
Glucose-Capillary: 94 mg/dL (ref 70–99)
Glucose-Capillary: 88 mg/dL (ref 70–99)
Glucose-Capillary: 122 mg/dL — ABNORMAL HIGH (ref 70–99)

## 2022-07-24 LAB — BASIC METABOLIC PANEL
Anion gap: 11 (ref 5–15)
BUN: 28 mg/dL — ABNORMAL HIGH (ref 8–23)
CO2: 30 mmol/L (ref 22–32)
Calcium: 8.9 mg/dL (ref 8.9–10.3)
Chloride: 98 mmol/L (ref 98–111)
Creatinine, Ser: 1.77 mg/dL — ABNORMAL HIGH (ref 0.44–1.00)
GFR, Estimated: 31 mL/min — ABNORMAL LOW (ref >60)
Glucose, Bld: 124 mg/dL — ABNORMAL HIGH (ref 70–99)
Potassium: 3.9 mmol/L (ref 3.5–5.1)
Sodium: 139 mmol/L (ref 135–145)

## 2022-07-24 MED ORDER — POLYETHYLENE GLYCOL 3350 17 G PO PACK
17.0000 g | PACK | Freq: Every day | ORAL | Status: DC
  Administered 2022-07-24 – 2022-07-25 (×2): 17 g via ORAL
  Filled 2022-07-24 (×2): qty 1

## 2022-07-24 MED ORDER — FUROSEMIDE 10 MG/ML IJ SOLN
40.0000 mg | Freq: Two times a day (BID) | INTRAMUSCULAR | Status: AC
  Administered 2022-07-24 – 2022-07-25 (×2): 40 mg via INTRAVENOUS
  Filled 2022-07-24 (×2): qty 4

## 2022-07-24 MED ORDER — POTASSIUM CHLORIDE CRYS ER 20 MEQ PO TBCR
40.0000 meq | EXTENDED_RELEASE_TABLET | Freq: Once | ORAL | Status: AC
  Administered 2022-07-24: 40 meq via ORAL
  Filled 2022-07-24: qty 2

## 2022-07-24 MED ORDER — SENNOSIDES-DOCUSATE SODIUM 8.6-50 MG PO TABS
1.0000 | ORAL_TABLET | Freq: Two times a day (BID) | ORAL | Status: DC
  Administered 2022-07-24 – 2022-07-25 (×3): 1 via ORAL
  Filled 2022-07-24 (×3): qty 1

## 2022-07-24 MED ORDER — TECHNETIUM TO 99M ALBUMIN AGGREGATED: 4.4000 | Freq: Once | INTRAVENOUS | Status: DC | PRN

## 2022-07-24 MED ORDER — ZINC OXIDE 40 % EX OINT
TOPICAL_OINTMENT | Freq: Two times a day (BID) | CUTANEOUS | Status: DC
  Filled 2022-07-24: qty 57

## 2022-07-24 MED ORDER — HYDROCODONE-ACETAMINOPHEN 5-325 MG PO TABS: 1.0000 | ORAL_TABLET | Freq: Once | ORAL | Status: DC

## 2022-07-24 MED ORDER — MUPIROCIN CALCIUM 2 % EX CREA
TOPICAL_CREAM | Freq: Every day | CUTANEOUS | Status: DC
  Filled 2022-07-24: qty 15

## 2022-07-24 NOTE — TOC Progression Note (Signed)
Transition of Care [facility]) - Progression Note    Patient Details  Name: TERSEA MOSHER MRN: [id number] Date of Birth: [birth year]  Transition of [facility]) CM/SW Contact  Tom-Johnson, Hershal Coria, RN Phone Number: 07/24/2022, 3:41 PM  Clinical Narrative:     Patient requested a hospital bed, hoyer lift and states her wheelchair's wheel is broken. Order placed and called in to [employer]. Earna Coder to deliver to patient's home. CM has to verify duration of wheelchair, patient in dialysis at this time.  Ferrel Logan with APS spoke with CM about patient's medical update. Mitzi Davenport states she has been working with patient prior to admission and will be coming with a nurse to assess patient for Capacity to make decisions.   CM spoke with patient at bedside about needs for post hospital transition.           Barriers to Discharge: Continued Medical Work up  Expected Discharge Plan and Services   Discharge Planning Services: CM Consult   Living arrangements for the past 2 months: Single Family Home                                       Social Determinants of Health (SDOH) Interventions SDOH Screenings   Food Insecurity: No Food Insecurity (07/22/2022)  Housing: Low Risk  (07/22/2022)  Transportation Needs: No Transportation Needs (07/22/2022)  Utilities: Not At Risk (07/22/2022)  Tobacco Use: Medium Risk (07/22/2022)    Readmission Risk Interventions     No data to display

## 2022-07-24 NOTE — Progress Notes (Addendum)
PROGRESS NOTE    [name]  ZOX:[id number] DOB: [birth year] DOA: 07/22/2022 PCP: Burnice Logan, PA  66/F with morbid obesity, asthma, OSA/OHS, diastolic CHF, schizophrenia, chronic pain, CKD 3, hypothyroidism, with debility and bedbound status presented to the ED with acute hypoglycemia associated with shortness of breath and wheezing.  Woke up yesterday morning feeling poorly, her blood sugar was low, drank some juice, repeat blood sugar was still low, then developed acute shortness of breath. -Called 911 came to the ED where she was noted to be mildly hypoxic and tachypneic.  Labs in the ER noted pulmonary vascular congestion on chest x-ray, creatinine up to 2.5 and abnormal D-dimer   Subjective: -Concerned about her sacral decubitus wound  Assessment and Plan:  Acute on chronic hypoxic respiratory failure Admission requested for hypoxia, although at the time of last discharge, she was diagnosed with chronic respiratory failure and discharged on home oxygen, which she states she did not get.  Also did not follow-up with pulmonology as recommended. -suspect it is secondary to obesity hypoventilation, diastolic HF and asthma.  VBG normal.  CHF/volume status difficult to assess.  PE was suspected by ER provider, and d-dimer elevated. - VQ scan still pending, Dopplers negative, on empiric heparin, clinical suspicion is low, discussed with radiology, she is right at the cutoff for their weight limit for VQ table, they will attempt today -Needs pulmonary follow-up , does not appear to require O2 at rest at this time, largely nonambulatory as well  Obesity hypoventilation syndrome (HCC) - CPAP at night - needs pulmonology follow-up after discharge - Buprenorphine and Ativan discontinued on admission  Acute on chronic diastolic CHF  -Echo 7/23 with preserved EF, indeterminate diastolic parameters, mild to moderate aortic stenosis  -Here, CXR with cardiomegaly and vascular congestion.  BNP  unreliable.  Fluid status not possible to assess clinically due to obesity.   -Improving with diuresis, 5.5 L negative, creatinine stable/improving, continue IV Lasix 1 more day -Poor candidate for SGLT2 with body habitus and bedbound status -GDMT limited by CKD -Follow-up with cardiology at [facility] medical  Mild persistent asthma FEV1 50% in 2018.  Diagnosed with moderate asthma by Pulm at that time. -Stable, continue Pulmicort and bronchodilators  Stage 3b chronic kidney disease (CKD) (HCC) Creatinine baseline used to be 1.4-1.6, but more recent creatinine with her PCP was 2.2 back in March -Improved, now stable, BMP in a.m.  Hypokalemia - Supplement potassium  Hyponatremia Mild, asymptomatic - Lasix and trend BMP  Schizophrenia (HCC) - Continue Geodon, venlafaxine - Continue Ativan, lower dose  Chronic pain syndrome - Continue home Belbuca, Linzess -Continue Lyrica  Obesity, morbid, BMI 50 or higher (HCC) BMI greater than 50  OSA on CPAP - Continue home CPAP  Type II diabetes mellitus (HCC) Hemoglobin A1c 6% 3 months ago - Hold home Trulicity, Crestor - Cut down NPH dose, hypoglycemic on admission - Sliding scale corrections  Anemia due to chronic kidney disease Hemoglobin stable relative to baseline 9-10 -Hold home iron  HLD (hyperlipidemia) - Continue home Crestor  Hypothyroidism - Continue home levothyroxine  Severe debility -Mostly bedbound, chronic pain -Significantly limited ADLs -Would benefit from hospital bed  Sacral decubitus wound -[facility] consult  DVT prophylaxis: IV heparin Code Status: Full code Family Communication: No family at bedside Disposition Plan: Home likely tomorrow  Consultants:    Procedures:   Antimicrobials:    Objective: Vitals:   07/23/22 2031 07/23/22 2044 07/24/22 0515 07/24/22 0900  BP:  (!) 112/59 99/60 118/74  Pulse: 86 89 81 81  Resp:  16 17   Temp:  98.4 F (36.9 C) 98.1 F (36.7 C) 98.3 F (36.8 C)   TempSrc:   Oral   SpO2: 94% 95% 93% 93%  Weight:      Height:        Intake/Output Summary (Last 24 hours) at 07/24/2022 1206 Last data filed at 07/24/2022 0900 Gross per 24 hour  Intake 740 ml  Output 4800 ml  Net -4060 ml   Filed Weights   07/22/22 2000  Weight: (!) 159 kg    Examination:  General exam: Obese chronically ill female sitting up in bed, AAOx3, no distress HEENT: Neck obese unable to assess JVD CVS: S1-S2, regular rhythm Lungs distant breath sounds, otherwise clear Abdomen: Soft, nontender, bowel sounds present Extremities: Chronic venous stasis, trace edema  Skin: No rashes Psychiatry:  Mood & affect appropriate.     Data Reviewed:   CBC: Recent Labs  Lab 07/22/22 1124 07/22/22 1732 07/23/22 0454 07/24/22 0420  WBC 7.6  --  5.0 6.3  NEUTROABS 6.1  --   --   --   HGB 9.3* 9.5* 9.6* 9.4*  HCT 29.6* 28.0* 29.1* 29.1*  MCV 82.7  --  81.7 82.9  PLT 201  --  178 207   Basic Metabolic Panel: Recent Labs  Lab 07/22/22 1124 07/22/22 1443 07/22/22 1732 07/23/22 0454 07/24/22 0420  NA 132*  --  136 136 139  K 3.3*  --  3.4* 3.3* 3.9  CL 96*  --   --  94* 98  CO2 22  --   --  30 30  GLUCOSE 104*  --   --  69* 124*  BUN 47*  --   --  36* 28*  CREATININE 2.58*  --   --  1.79* 1.77*  CALCIUM 9.1  --   --  8.7* 8.9  MG  --  2.0  --   --   --    GFR: Estimated Creatinine Clearance: 48.3 mL/min (A) (by C-G formula based on SCr of 1.77 mg/dL (H)). Liver Function Tests: Recent Labs  Lab 07/22/22 1124  AST 15  ALT 14  ALKPHOS 73  BILITOT 0.4  PROT 6.1*  ALBUMIN 3.5   No results for input(s): "LIPASE", "AMYLASE" in the last 168 hours. No results for input(s): "AMMONIA" in the last 168 hours. Coagulation Profile: No results for input(s): "INR", "PROTIME" in the last 168 hours. Cardiac Enzymes: No results for input(s): "CKTOTAL", "CKMB", "CKMBINDEX", "TROPONINI" in the last 168 hours. BNP (last 3 results) No results for input(s): "PROBNP" in  the last 8760 hours. HbA1C: No results for input(s): "HGBA1C" in the last 72 hours. CBG: Recent Labs  Lab 07/23/22 1147 07/23/22 1621 07/23/22 2044 07/24/22 0936 07/24/22 1141  GLUCAP 91 154* 132* 188* 94   Lipid Profile: No results for input(s): "CHOL", "HDL", "LDLCALC", "TRIG", "CHOLHDL", "LDLDIRECT" in the last 72 hours. Thyroid Function Tests: Recent Labs    07/23/22 0454  TSH 7.961*  FREET4 0.95   Anemia Panel: No results for input(s): "VITAMINB12", "FOLATE", "FERRITIN", "TIBC", "IRON", "RETICCTPCT" in the last 72 hours. Urine analysis:    Component Value Date/Time   COLORURINE COLORLESS (A) 07/22/2022 2031   APPEARANCEUR CLEAR 07/22/2022 2031   APPEARANCEUR Clear 11/13/2013 1310   LABSPEC 1.005 07/22/2022 2031   LABSPEC 1.017 11/13/2013 1310   PHURINE 5.0 07/22/2022 2031   GLUCOSEU NEGATIVE 07/22/2022 2031   GLUCOSEU Negative 11/13/2013 1310   GLUCOSEU  NEG mg/dL 16/11/9602 5409   HGBUR NEGATIVE 07/22/2022 2031   BILIRUBINUR NEGATIVE 07/22/2022 2031   BILIRUBINUR Negative 11/13/2013 1310   KETONESUR NEGATIVE 07/22/2022 2031   PROTEINUR NEGATIVE 07/22/2022 2031   UROBILINOGEN 0.2 11/12/2014 2057   NITRITE NEGATIVE 07/22/2022 2031   LEUKOCYTESUR NEGATIVE 07/22/2022 2031   LEUKOCYTESUR Trace 11/13/2013 1310   Sepsis Labs: @LABRCNTIP (procalcitonin:4,lacticidven:4)  ) Recent Results (from the past 240 hour(s))  SARS Coronavirus 2 by RT PCR (hospital order, performed in [facility] hospital lab) *cepheid single result test* Anterior Nasal Swab     Status: None   Collection Time: 07/22/22 10:52 AM   Specimen: Anterior Nasal Swab  Result Value Ref Range Status   SARS Coronavirus 2 by RT PCR NEGATIVE NEGATIVE Final    Comment: Performed at [facility], [address], [place], [place] [address]     Radiology Studies: DG CHEST PORT 1 VIEW  Result Date: 07/24/2022 CLINICAL DATA:  Hypoxia, shortness of breath and chest pain. EXAM: PORTABLE CHEST 1 VIEW  COMPARISON:  07/22/2022 FINDINGS: Low lung volumes with bibasilar atelectasis. There may be a mild component of pulmonary interstitial edema without overt airspace edema or significant pleural effusions. No pneumothorax or focal airspace consolidation. External device overlies the left chest. IMPRESSION: Low lung volumes with bibasilar atelectasis. There may be a mild component of pulmonary interstitial edema. Electronically Signed   By: Irish Lack M.D.   On: 07/24/2022 10:49   VAS [place] LOWER EXTREMITY VENOUS (DVT)  Result Date: 07/23/2022  Lower Venous DVT Study Patient Name:  JOELLEN YAMANE  Date of Exam:   07/23/2022 Medical Rec #: [id number]         Accession #:    [id number] Date of Birth: 1955/05/26         Patient Gender: F Patient Age:   65 years Exam Location:  [facility] Procedure:      VAS [place] LOWER EXTREMITY VENOUS (DVT) Referring Phys: Joen Laura --------------------------------------------------------------------------------  Indications: New onset SOB, chronic leg swelling RT>LT.  Limitations: Body habitus. Comparison Study: 04-20-2019 Prior right lower extremity venous study was                   negative for DVT. Performing Technologist: Jean Rosenthal RDMS, RVT  Examination Guidelines: A complete evaluation includes B-mode imaging, spectral Doppler, color Doppler, and power Doppler as needed of all accessible portions of each vessel. Bilateral testing is considered an integral part of a complete examination. Limited examinations for reoccurring indications may be performed as noted. The reflux portion of the exam is performed with the patient in reverse Trendelenburg.  +---------+---------------+---------+-----------+----------+---------------+ RIGHT    CompressibilityPhasicitySpontaneityPropertiesThrombus Aging  +---------+---------------+---------+-----------+----------+---------------+ CFV      Full           Yes      Yes                                   +---------+---------------+---------+-----------+----------+---------------+ SFJ      Full                                                         +---------+---------------+---------+-----------+----------+---------------+ FV Prox  Full                                                         +---------+---------------+---------+-----------+----------+---------------+  FV Mid   Full                                                         +---------+---------------+---------+-----------+----------+---------------+ FV Distal               Yes      Yes                  Patent by color +---------+---------------+---------+-----------+----------+---------------+ PFV      Full                                                         +---------+---------------+---------+-----------+----------+---------------+ POP      Full           Yes      Yes                                  +---------+---------------+---------+-----------+----------+---------------+ PTV      Full                                                         +---------+---------------+---------+-----------+----------+---------------+ PERO     Full                                                         +---------+---------------+---------+-----------+----------+---------------+   +---------+---------------+---------+-----------+----------+---------------+ LEFT     CompressibilityPhasicitySpontaneityPropertiesThrombus Aging  +---------+---------------+---------+-----------+----------+---------------+ CFV      Full           Yes      Yes                                  +---------+---------------+---------+-----------+----------+---------------+ SFJ      Full                                                         +---------+---------------+---------+-----------+----------+---------------+ FV Prox  Full                                                          +---------+---------------+---------+-----------+----------+---------------+ FV Mid   Full                                                         +---------+---------------+---------+-----------+----------+---------------+  FV Distal               Yes      Yes                  Patent by color +---------+---------------+---------+-----------+----------+---------------+ PFV      Full                                                         +---------+---------------+---------+-----------+----------+---------------+ POP      Partial        Yes      Yes                                  +---------+---------------+---------+-----------+----------+---------------+ PTV      Full                                                         +---------+---------------+---------+-----------+----------+---------------+ PERO     Full                                                         +---------+---------------+---------+-----------+----------+---------------+     Summary: RIGHT: - There is no evidence of deep vein thrombosis in the lower extremity. However, portions of this examination were limited- see technologist comments above.  - No cystic structure found in the popliteal fossa.  LEFT: - There is no evidence of deep vein thrombosis in the lower extremity. However, portions of this examination were limited- see technologist comments above.  - No cystic structure found in the popliteal fossa.  *See table(s) above for measurements and observations. Electronically signed by Heath Lark on 07/23/2022 at 6:08:34 PM.    Final      Scheduled Meds:  aspirin EC  81 mg Oral Daily   budesonide (PULMICORT) nebulizer solution  0.5 mg Nebulization BID   Buprenorphine HCl  300 mcg Oral BID   Chlorhexidine Gluconate Cloth  6 each Topical Daily   enoxaparin (LOVENOX) injection  150 mg Subcutaneous Q12H   famotidine  40 mg Oral QPC supper   fesoterodine  4 mg Oral Daily   furosemide  40 mg  Intravenous BID   insulin aspart  0-15 Units Subcutaneous TID WC   insulin aspart  0-5 Units Subcutaneous QHS   insulin NPH Human  25 Units Subcutaneous QAC breakfast   ipratropium-albuterol  3 mL Nebulization BID   levothyroxine  200 mcg Oral Q0600   linaclotide  290 mcg Oral QAC breakfast   liver oil-zinc oxide   Topical BID   LORazepam  0.5 mg Oral TID   mirabegron ER  25 mg Oral Daily   mupirocin cream   Topical Daily   pantoprazole  40 mg Oral Daily   polyethylene glycol  17 g Oral Daily   pregabalin  300 mg Oral BID   rosuvastatin  20 mg Oral Daily   senna-docusate  1 tablet Oral BID  venlafaxine XR  150 mg Oral BID   ziprasidone  60 mg Oral QPC supper   Continuous Infusions:     LOS: 1 day    Time spent:    Zannie Cove, MD [facility]   07/24/2022, 12:06 PM

## 2022-07-24 NOTE — Progress Notes (Signed)
Heart Failure Navigator Progress Note  Assessed for Heart & Vascular TOC clinic readiness.  Patient does not meet criteria due to EF 65-70%, No HF TOC per Dr.Joseph. .   Navigator will sign off at this time.   Rhae Hammock, BSN, Scientist, clinical (histocompatibility and immunogenetics) Only

## 2022-07-24 NOTE — Consult Note (Addendum)
WOC Nurse Consult Note: Reason for Consult: Consult requested for sacrum/buttocks. Turned to assess patient and skin is intact without open wounds.  There is darker colored skin changes with irregular "shaggy" appearance, consistent with moisture associated skin damage and "chronic tissue damage." Pt states that it is "itchy" and she admits to sitting in a wheel chair for prolonged period of time prior to admission.  ICD-10 CM Codes for Irritant Dermatitis L24A2 - Due to fecal, urinary or dual incontinence Right tip of 4th toe with black dry scab from previous injury, .5X.5cm  Dressing procedure/placement/frequency: Topical treatment orders provided for bedside nurses to perform as follows: Apply Bactroban to right 4th toe wound Q day, then cover with Band-Aid. Apply Desitin to bilat buttocks BID and PRN when turning or cleaning Please re-consult if further assistance is needed.  Thank-you,  Cammie Mcgee MSN, RN, CWOCN, [place], CNS [phone]

## 2022-07-24 NOTE — Progress Notes (Signed)
   07/24/22 2146  BiPAP/CPAP/SIPAP  Reason BIPAP/CPAP not in use Non-compliant (Refused, pt states likes her home machine with nasal pillows, and going home tomorrow)

## 2022-07-25 LAB — CBC
HCT: 30.1 % — ABNORMAL LOW (ref 36.0–46.0)
Hemoglobin: 9.5 g/dL — ABNORMAL LOW (ref 12.0–15.0)
MCH: 26.1 pg (ref 26.0–34.0)
MCHC: 31.6 g/dL (ref 30.0–36.0)
MCV: 82.7 fL (ref 80.0–100.0)
Platelets: 197 10*3/uL (ref 150–400)
RBC: 3.64 MIL/uL — ABNORMAL LOW (ref 3.87–5.11)
RDW: 16.7 % — ABNORMAL HIGH (ref 11.5–15.5)
WBC: 5.1 10*3/uL (ref 4.0–10.5)
nRBC: 0 % (ref 0.0–0.2)

## 2022-07-25 LAB — BASIC METABOLIC PANEL
Anion gap: 13 (ref 5–15)
BUN: 28 mg/dL — ABNORMAL HIGH (ref 8–23)
CO2: 30 mmol/L (ref 22–32)
Calcium: 9.3 mg/dL (ref 8.9–10.3)
Chloride: 98 mmol/L (ref 98–111)
Creatinine, Ser: 1.77 mg/dL — ABNORMAL HIGH (ref 0.44–1.00)
GFR, Estimated: 31 mL/min — ABNORMAL LOW (ref >60)
Glucose, Bld: 105 mg/dL — ABNORMAL HIGH (ref 70–99)
Potassium: 4.2 mmol/L (ref 3.5–5.1)
Sodium: 141 mmol/L (ref 135–145)

## 2022-07-25 LAB — GLUCOSE, CAPILLARY
Glucose-Capillary: 95 mg/dL (ref 70–99)
Glucose-Capillary: 102 mg/dL — ABNORMAL HIGH (ref 70–99)
Glucose-Capillary: 97 mg/dL (ref 70–99)

## 2022-07-25 MED ORDER — LORAZEPAM 1 MG PO TABS: 0.5000 mg | ORAL_TABLET | Freq: Two times a day (BID) | ORAL | 0 refills | Status: DC | PRN

## 2022-07-25 MED ORDER — HUMULIN N KWIKPEN 100 UNIT/ML ~~LOC~~ SUPN
25.0000 [IU] | PEN_INJECTOR | SUBCUTANEOUS | Status: DC
Start: 2022-07-25 — End: 2022-10-13

## 2022-07-25 MED ORDER — FUROSEMIDE 40 MG PO TABS
80.0000 mg | ORAL_TABLET | Freq: Every day | ORAL | 0 refills | Status: DC
Start: 2022-07-25 — End: 2022-09-28

## 2022-07-25 MED ORDER — ENOXAPARIN SODIUM 80 MG/0.8ML IJ SOSY: 80.0000 mg | PREFILLED_SYRINGE | INTRAMUSCULAR | Status: DC

## 2022-07-25 NOTE — Care Management Important Message (Signed)
Important Message  Patient Details  Name: [name] MRN: [id number] Date of Birth: 1956-01-03   Medicare Important Message Given:  Yes     Dorena Bodo 07/25/2022, 3:52 PM

## 2022-07-25 NOTE — Progress Notes (Addendum)
    Durable Medical Equipment  (From admission, onward)           Start     Ordered   07/25/22 1119  For home use only DME Hospital bed  Once       Comments: Bariatric hospital bed. Patient is bed bound at home. Unable to reposition self frequently with an ordinary bed.  Question Answer Comment  Length of Need 12 Months   Patient has (list medical condition): Debility, Bedbound   The above medical condition requires: Patient requires the ability to reposition frequently   Head must be elevated greater than: 30 degrees   Bed type Semi-electric   Hoyer Lift Yes   Support Surface: Low Air loss Mattress      07/25/22 1128   07/24/22 1537  For home use only DME Other see comment  Once       Comments: Hoyer Lift  Question:  Length of Need  Answer:  12 Months   07/24/22 1539           Patient's hip measurement is 80 inches

## 2022-07-25 NOTE — TOC Transition Note (Signed)
Transition of Care [facility]) - CM/SW Discharge Note   Patient Details  Name: [name] MRN: [id number] Date of Birth: 03/19/1955  Transition of [facility]) CM/SW Contact:  Tom-Johnson, Hershal Coria, RN Phone Number: 07/25/2022, 2:32 PM   Clinical Narrative:     Patient is scheduled for discharge today.  Readmission Risk Assessment done. Home health info, Outpatient referral, hospital f/u and discharge instructions on AVS. [employer] to deliver hospital bed and [facility].  Patient voiced concern about her electric wheelchair's broken wheel. Patient received wheelchair from [facility]. Patient permitted CM to call [employer] and check on chair's status. CM spoke with Lashell [phone]) and she states patient placed order on 07/15/22 and order signed by her PCP on 07/24/22.  Order is in review and it takes 3 days and then submitted for [employer].  Lashell states patient was told the process of getting her chair repaired or exchanged.  CM informed patient of process and patient voiced understanding.   [facility] to transport at discharge.  No further TOC needs noted.         Final next level of care: Home w Home Health Services Barriers to Discharge: Barriers Resolved   Patient Goals and CMS Choice CMS Medicare.gov Compare Post Acute Care list provided to:: Patient Choice offered to / list presented to : Patient  Discharge Placement                  Patient to be transferred to facility by: [facility]      Discharge Plan and Services Additional resources added to the After Visit Summary for     Discharge Planning Services: CM Consult            DME Arranged: Hospital bed [facility] lift) DME Agency: [employer] Date DME Agency Contacted: 07/24/22 Time DME Agency Contacted: 1535 Representative spoke with at DME Agency: Earna Coder HH Arranged: PT, OT, RN [facility] Agency: [facility] Date [facility] Agency Contacted: 07/25/22 Time HH Agency Contacted:  [phone] Representative spoke with at [facility] Agency: Becky Sax  Social Determinants of Health (SDOH) Interventions SDOH Screenings   Food Insecurity: No Food Insecurity (07/22/2022)  Housing: Low Risk  (07/22/2022)  Transportation Needs: No Transportation Needs (07/22/2022)  Utilities: Not At Risk (07/22/2022)  Tobacco Use: Medium Risk (07/22/2022)     Readmission Risk Interventions    07/25/2022    2:28 PM  Readmission Risk Prevention Plan  Transportation Screening Complete  Medication Review (RN Care Manager) Referral to Pharmacy  PCP or Specialist appointment within 3-5 days of discharge Complete  HRI or Home Care Consult Complete  SW Recovery Care/Counseling Consult Complete  Palliative Care Screening Not Applicable  Skilled Nursing Facility Patient Refused

## 2022-07-25 NOTE — Progress Notes (Signed)
    Durable Medical Equipment  (From admission, onward)           Start     Ordered   07/24/22 1537  For home use only DME Other see comment  Once       Comments: Hoyer Lift  Question:  Length of Need  Answer:  12 Months   07/24/22 1539   07/24/22 1532  For home use only DME Hospital bed  Once       Question Answer Comment  Length of Need 12 Months   Bed type Semi-electric      07/24/22 1535

## 2022-07-28 NOTE — Discharge Summary (Signed)
Physician Discharge Summary  [name] GEX:[id number] DOB: Jun 24, 1955 DOA: 07/22/2022  PCP: Burnice Logan, PA  Admit date: 07/22/2022 Discharge date: 07/25/2022  Time spent: 45 minutes  Recommendations for Outpatient Follow-up:  Cardiology at [facility] in 2 weeks, check BMP at [facility] PT/RN   Discharge Diagnoses:  Principal Problem:   Hypoxia Active Problems:   Chronic respiratory failure with hypoxia (HCC)   Mild persistent asthma   Chronic diastolic CHF (congestive heart failure) (HCC)   Obesity hypoventilation syndrome (HCC)   Stage 3b chronic kidney disease (CKD) (HCC)   Hypothyroidism   HLD (hyperlipidemia)   Essential hypertension   Anemia due to chronic kidney disease   Type II diabetes mellitus (HCC)   OSA on CPAP   Obesity, morbid, BMI 50 or higher (HCC)   Chronic pain syndrome   Schizophrenia (HCC)   Hyponatremia   Hypokalemia   Discharge Condition: improved  Diet recommendation: DM heart healthy  Filed Weights   07/22/22 2000  Weight: (!) 159 kg    History of present illness:  67/F with morbid obesity, asthma, OSA/OHS, diastolic CHF, schizophrenia, chronic pain, CKD 3, hypothyroidism, with debility and bedbound status presented to the ED with acute hypoglycemia associated with shortness of breath and wheezing.  Woke up yesterday morning feeling poorly, her blood sugar was low, drank some juice, repeat blood sugar was still low, then developed acute shortness of breath. -Called 911 came to the ED where she was noted to be mildly hypoxic and tachypneic.  Labs in the ER noted pulmonary vascular congestion on chest x-ray, creatinine up to 2.5 and abnormal D-dimer  Hospital Course:   Acute on chronic diastolic CHF  -Echo 7/23 with preserved EF, indeterminate diastolic parameters, mild to moderate aortic stenosis  -Here, CXR with cardiomegaly and vascular congestion.  BNP unreliable.  Fluid status not possible to assess clinically due to obesity.    -Improving with diuresis, 7 L negative, creatinine stable/improving, changed to po lasix 80mg  daily -Poor candidate for SGLT2 with body habitus and bedbound status -GDMT limited by CKD -Follow-up with cardiology at [facility]  Acute on chronic hypoxic respiratory failure Admission requested for hypoxia, although at the time of last discharge, she was diagnosed with chronic respiratory failure and discharged on home oxygen, which she states she did not get.  Also did not follow-up with pulmonology as recommended. -suspect it is secondary to obesity hypoventilation, diastolic HF and asthma.  VBG normal.  CHF/volume status difficult to assess.  PE was suspected by ER provider, and d-dimer elevated. - VQ scan negative, Dopplers negative,  -does not appear to require O2 at rest at this time, largely nonambulatory as well, stable off O2 for 2days    Obesity hypoventilation syndrome (HCC) - CPAP at night - needs pulmonology follow-up after discharge - Buprenorphine and Ativan discontinued on admission   Mild persistent asthma FEV1 50% in 2018.  Diagnosed with moderate asthma by Pulm at that time. -Stable, continue Pulmicort and bronchodilators   Stage 3b chronic kidney disease (CKD) (HCC) Creatinine baseline used to be 1.4-1.6, but more recent creatinine with her PCP was 2.2 back in March -Improved, now stable, 1.7 at DC   Hypokalemia - Supplement potassium   Hyponatremia Mild, improved   Schizophrenia (HCC) - Continue Geodon, venlafaxine - Continue Ativan, lowered dose   Chronic pain syndrome - Continue home Belbuca, Linzess -Continue Lyrica   Obesity, morbid, BMI 50 or higher (HCC) BMI greater than 50   OSA on CPAP -  Continue home CPAP   Type II diabetes mellitus (HCC) Hemoglobin A1c 6% 3 months ago - resume Trulicity, Crestor - Cut down NPH dose, hypoglycemic on admission   Anemia due to chronic kidney disease Hemoglobin stable relative to baseline 9-10 -Hold  home iron   HLD (hyperlipidemia) - Continue home Crestor   Hypothyroidism - Continue home levothyroxine   Severe debility -Mostly bedbound, chronic pain -Significantly limited ADLs -Would benefit from hospital bed   Sacral decubitus wound -[facility] consult    Discharge Exam: Vitals:   07/25/22 1644 07/25/22 2036  BP: 104/67 132/78  Pulse: 89 86  Resp: 18 16  Temp: 98.4 F (36.9 C) 99 F (37.2 C)  SpO2: 98% 93%  General exam: Obese chronically ill female sitting up in bed, AAOx3, no distress HEENT: Neck obese unable to assess JVD CVS: S1-S2, regular rhythm Lungs distant breath sounds, otherwise clear Abdomen: Soft, nontender, bowel sounds present Extremities: Chronic venous stasis, trace edema  Skin: No rashes Psychiatry:  Mood & affect appropriate  Discharge Instructions   Discharge Instructions     Diet - low sodium heart healthy   Complete by: As directed    Diet Carb Modified   Complete by: As directed    Discharge wound care:   Complete by: As directed    routine   Increase activity slowly   Complete by: As directed       Allergies as of 07/25/2022       Reactions   Bee Venom Anaphylaxis   Invokana [canagliflozin] Anaphylaxis   Dyspnea and urinary retention   Shellfish Allergy Anaphylaxis   Buprenorphine Hcl Other (See Comments)   Overly sedated with morphine drip.   Other Other (See Comments)   Cats - bad asthma, stopped up        Medication List     STOP taking these medications    Belbuca 300 MCG Film Generic drug: Buprenorphine HCl   sucralfate 1 g tablet Commonly known as: CARAFATE       TAKE these medications    acetaminophen 650 MG CR tablet Commonly known as: TYLENOL Take 1,300 mg by mouth every 8 (eight) hours as needed for pain.   albuterol 108 (90 Base) MCG/ACT inhaler Commonly known as: VENTOLIN HFA Inhale 2 puffs into the lungs every 4 (four) hours as needed for wheezing or shortness of breath.   aspirin EC 81 MG  tablet Take 1 tablet (81 mg total) by mouth daily.   esomeprazole 40 MG capsule Commonly known as: NEXIUM Take 1 capsule (40 mg total) by mouth daily before breakfast.   famotidine 40 MG tablet Commonly known as: PEPCID Take 40 mg by mouth daily after supper.   ferrous sulfate 325 (65 FE) MG tablet Take 325 mg by mouth in the morning.   furosemide 40 MG tablet Commonly known as: LASIX Take 2 tablets (80 mg total) by mouth daily. What changed:  medication strength how much to take   HumuLIN N KwikPen 100 UNIT/ML Kiwkpen Generic drug: Insulin NPH (Human) (Isophane) Inject 25 Units into the skin every morning. And pen needles 1/day What changed: how much to take   levothyroxine 200 MCG tablet Commonly known as: SYNTHROID Take 1 tablet (200 mcg total) by mouth daily before breakfast.   linaclotide 290 MCG Caps capsule Commonly known as: LINZESS Take 290 mcg by mouth daily before breakfast.   LORazepam 1 MG tablet Commonly known as: ATIVAN Take 0.5 tablets (0.5 mg total) by mouth 2 (two) times  daily as needed for anxiety. What changed:  how much to take when to take this reasons to take this   mirabegron ER 25 MG Tb24 tablet Commonly known as: MYRBETRIQ Take 25 mg by mouth in the morning.   naloxone 4 MG/0.1ML Liqd nasal spray kit Commonly known as: NARCAN Place 1 spray into the nose as needed for opioid reversal.   OneTouch Delica Plus Lancet33G Misc 1 each by Other route 3 (three) times daily. Use to check blood sugar 3 times a day   OneTouch Verio test strip Generic drug: glucose blood TEST BLOOD SUGAR THREE TIMES DAILY   polyvinyl alcohol 1.4 % ophthalmic solution Commonly known as: LIQUIFILM TEARS 1 drop as needed for dry eyes.   potassium chloride SA 20 MEQ tablet Commonly known as: KLOR-CON M Take 20 mEq by mouth daily.   pregabalin 300 MG capsule Commonly known as: LYRICA Take 300 mg by mouth 2 (two) times daily.   rosuvastatin 20 MG  tablet Commonly known as: CRESTOR Take 20 mg by mouth daily.   Sure Comfort Pen Needles 32G X 6 MM Misc Generic drug: Insulin Pen Needle USE TO INJECT INSULIN FOUR TIMES DAILY   tolterodine 2 MG 24 hr capsule Commonly known as: DETROL LA Take 2 mg by mouth every evening.   Trulicity 4.5 MG/0.5ML Sopn Generic drug: Dulaglutide Inject 4.5 mg into the skin once a week.   valACYclovir 1000 MG tablet Commonly known as: VALTREX Take 1,000 mg by mouth daily.   venlafaxine XR 150 MG 24 hr capsule Commonly known as: EFFEXOR-XR Take 150 mg by mouth 2 (two) times daily.   Vitamin D-3 25 MCG (1000 UT) Caps Take 1,000 Units by mouth daily.   ziprasidone 60 MG capsule Commonly known as: GEODON Take 60 mg by mouth daily after supper.               Discharge Care Instructions  (From admission, onward)           Start     Ordered   07/25/22 0000  Discharge wound care:       Comments: routine   07/25/22 1610           Allergies  Allergen Reactions   Bee Venom Anaphylaxis   Invokana [Canagliflozin] Anaphylaxis    Dyspnea and urinary retention   Shellfish Allergy Anaphylaxis   Buprenorphine Hcl Other (See Comments)    Overly sedated with morphine drip.   Other Other (See Comments)    Cats - bad asthma, stopped up    Follow-up Information     [facility] Follow up.   Why: Someone will call you to schedule first home visit. Contact information: [address] [place] [address]  [phone]                 The results of significant diagnostics from this hospitalization (including imaging, microbiology, ancillary and laboratory) are listed below for reference.    Significant Diagnostic Studies: NM Pulmonary Perfusion  Result Date: 07/24/2022 CLINICAL DATA:  PE suspected, positive D-dimer EXAM: NUCLEAR MEDICINE PERFUSION LUNG SCAN TECHNIQUE: Perfusion images were obtained in multiple projections after intravenous injection of  radiopharmaceutical. Ventilation scans intentionally deferred if perfusion scan and chest x-ray adequate for interpretation during COVID 19 epidemic. RADIOPHARMACEUTICALS:  4.4 mCi Tc-3m MAA IV COMPARISON:  Same day chest radiograph FINDINGS: Cardiomegaly. Normal, homogeneous pulmonary perfusion. No suspicious perfusion defect. IMPRESSION: 1. Very low probability for pulmonary embolism by modified perfusion only PIOPED criteria (  PE absent). 2.  Cardiomegaly. Electronically Signed   By: Jearld Lesch M.D.   On: 07/24/2022 16:20   DG CHEST PORT 1 VIEW  Result Date: 07/24/2022 CLINICAL DATA:  Hypoxia, shortness of breath and chest pain. EXAM: PORTABLE CHEST 1 VIEW COMPARISON:  07/22/2022 FINDINGS: Low lung volumes with bibasilar atelectasis. There may be a mild component of pulmonary interstitial edema without overt airspace edema or significant pleural effusions. No pneumothorax or focal airspace consolidation. External device overlies the left chest. IMPRESSION: Low lung volumes with bibasilar atelectasis. There may be a mild component of pulmonary interstitial edema. Electronically Signed   By: Irish Lack M.D.   On: 07/24/2022 10:49   VAS [place] LOWER EXTREMITY VENOUS (DVT)  Result Date: 07/23/2022  Lower Venous DVT Study Patient Name:  AHLINA SHUKLA  Date of Exam:   07/23/2022 Medical Rec #: [id number]         Accession #:    [id number] Date of Birth: [birth year]         Patient Gender: F Patient Age:   78 years Exam Location:  [facility] Procedure:      VAS [place] LOWER EXTREMITY VENOUS (DVT) Referring Phys: Joen Laura --------------------------------------------------------------------------------  Indications: New onset SOB, chronic leg swelling RT>LT.  Limitations: Body habitus. Comparison Study: 04-20-2019 Prior right lower extremity venous study was                   negative for DVT. Performing Technologist: Jean Rosenthal RDMS, RVT  Examination Guidelines: A complete evaluation includes  B-mode imaging, spectral Doppler, color Doppler, and power Doppler as needed of all accessible portions of each vessel. Bilateral testing is considered an integral part of a complete examination. Limited examinations for reoccurring indications may be performed as noted. The reflux portion of the exam is performed with the patient in reverse Trendelenburg.  +---------+---------------+---------+-----------+----------+---------------+ RIGHT    CompressibilityPhasicitySpontaneityPropertiesThrombus Aging  +---------+---------------+---------+-----------+----------+---------------+ CFV      Full           Yes      Yes                                  +---------+---------------+---------+-----------+----------+---------------+ SFJ      Full                                                         +---------+---------------+---------+-----------+----------+---------------+ FV Prox  Full                                                         +---------+---------------+---------+-----------+----------+---------------+ FV Mid   Full                                                         +---------+---------------+---------+-----------+----------+---------------+ FV Distal               Yes      Yes  Patent by color +---------+---------------+---------+-----------+----------+---------------+ PFV      Full                                                         +---------+---------------+---------+-----------+----------+---------------+ POP      Full           Yes      Yes                                  +---------+---------------+---------+-----------+----------+---------------+ PTV      Full                                                         +---------+---------------+---------+-----------+----------+---------------+ PERO     Full                                                          +---------+---------------+---------+-----------+----------+---------------+   +---------+---------------+---------+-----------+----------+---------------+ LEFT     CompressibilityPhasicitySpontaneityPropertiesThrombus Aging  +---------+---------------+---------+-----------+----------+---------------+ CFV      Full           Yes      Yes                                  +---------+---------------+---------+-----------+----------+---------------+ SFJ      Full                                                         +---------+---------------+---------+-----------+----------+---------------+ FV Prox  Full                                                         +---------+---------------+---------+-----------+----------+---------------+ FV Mid   Full                                                         +---------+---------------+---------+-----------+----------+---------------+ FV Distal               Yes      Yes                  Patent by color +---------+---------------+---------+-----------+----------+---------------+ PFV      Full                                                         +---------+---------------+---------+-----------+----------+---------------+  POP      Partial        Yes      Yes                                  +---------+---------------+---------+-----------+----------+---------------+ PTV      Full                                                         +---------+---------------+---------+-----------+----------+---------------+ PERO     Full                                                         +---------+---------------+---------+-----------+----------+---------------+     Summary: RIGHT: - There is no evidence of deep vein thrombosis in the lower extremity. However, portions of this examination were limited- see technologist comments above.  - No cystic structure found in the popliteal fossa.  LEFT: - There is no  evidence of deep vein thrombosis in the lower extremity. However, portions of this examination were limited- see technologist comments above.  - No cystic structure found in the popliteal fossa.  *See table(s) above for measurements and observations. Electronically signed by Heath Lark on 07/23/2022 at 6:08:34 PM.    Final    DG Chest Portable 1 View  Result Date: 07/22/2022 CLINICAL DATA:  Shortness of breath.  Low O2 sat EXAM: PORTABLE CHEST 1 VIEW COMPARISON:  X-ray 09/06/2021. FINDINGS: Surgical clips at thoracic inlet. Enlarged cardiopericardial silhouette with vascular congestion. No pneumothorax, effusion or edema. Overlapping cardiac leads. Eventration of the diaphragm. IMPRESSION: Enlarged heart with vascular congestion. Electronically Signed   By: Karen Kays M.D.   On: 07/22/2022 12:34    Microbiology: Recent Results (from the past 240 hour(s))  SARS Coronavirus 2 by RT PCR (hospital order, performed in [facility] hospital lab) *cepheid single result test* Anterior Nasal Swab     Status: None   Collection Time: 07/22/22 10:52 AM   Specimen: Anterior Nasal Swab  Result Value Ref Range Status   SARS Coronavirus 2 by RT PCR NEGATIVE NEGATIVE Final    Comment: Performed at [facility], [address] [place], [place] [address]     Labs: Basic Metabolic Panel: Recent Labs  Lab 07/22/22 1124 07/22/22 1443 07/22/22 1732 07/23/22 0454 07/24/22 0420 07/25/22 0422  NA 132*  --  136 136 139 141  K 3.3*  --  3.4* 3.3* 3.9 4.2  CL 96*  --   --  94* 98 98  CO2 22  --   --  30 30 30   GLUCOSE 104*  --   --  69* 124* 105*  BUN 47*  --   --  36* 28* 28*  CREATININE 2.58*  --   --  1.79* 1.77* 1.77*  CALCIUM 9.1  --   --  8.7* 8.9 9.3  MG  --  2.0  --   --   --   --    Liver Function Tests: Recent Labs  Lab 07/22/22 1124  AST 15  ALT 14  ALKPHOS 73  BILITOT 0.4  PROT 6.1*  ALBUMIN 3.5   No results for input(s): "LIPASE", "AMYLASE" in the last 168 hours. No results  for input(s): "AMMONIA" in the last 168 hours. CBC: Recent Labs  Lab 07/22/22 1124 07/22/22 1732 07/23/22 0454 07/24/22 0420 07/25/22 0422  WBC 7.6  --  5.0 6.3 5.1  NEUTROABS 6.1  --   --   --   --   HGB 9.3* 9.5* 9.6* 9.4* 9.5*  HCT 29.6* 28.0* 29.1* 29.1* 30.1*  MCV 82.7  --  81.7 82.9 82.7  PLT 201  --  178 207 197   Cardiac Enzymes: No results for input(s): "CKTOTAL", "CKMB", "CKMBINDEX", "TROPONINI" in the last 168 hours. BNP: BNP (last 3 results) Recent Labs    07/22/22 1124  BNP 50.4    ProBNP (last 3 results) No results for input(s): "PROBNP" in the last 8760 hours.  CBG: Recent Labs  Lab 07/24/22 1628 07/24/22 2128 07/25/22 0737 07/25/22 1116 07/25/22 1641  GLUCAP 88 122* 95 102* 97       Signed:  Zannie Cove MD.  [facility] 07/28/2022, 1:14 PM

## 2022-07-30 ENCOUNTER — Encounter (HOSPITAL_COMMUNITY): Payer: Self-pay

## 2022-07-30 ENCOUNTER — Other Ambulatory Visit: Payer: Self-pay

## 2022-07-30 ENCOUNTER — Emergency Department (HOSPITAL_COMMUNITY): Payer: 59

## 2022-07-30 ENCOUNTER — Inpatient Hospital Stay (HOSPITAL_COMMUNITY)
Admission: EM | Admit: 2022-07-30 | Discharge: 2022-08-02 | DRG: 682 | Disposition: A | Discharge status: Home Health | Payer: 59 | Attending: Internal Medicine | Admitting: Internal Medicine

## 2022-07-30 DIAGNOSIS — F32A Depression, unspecified: Secondary | ICD-10-CM | POA: Diagnosis present

## 2022-07-30 DIAGNOSIS — R0602 Shortness of breath: Secondary | ICD-10-CM | POA: Diagnosis not present

## 2022-07-30 DIAGNOSIS — T502X5A Adverse effect of carbonic-anhydrase inhibitors, benzothiadiazides and other diuretics, initial encounter: Secondary | ICD-10-CM | POA: Diagnosis present

## 2022-07-30 DIAGNOSIS — C50411 Malignant neoplasm of upper-outer quadrant of right female breast: Secondary | ICD-10-CM | POA: Diagnosis present

## 2022-07-30 DIAGNOSIS — Z91030 Bee allergy status: Secondary | ICD-10-CM

## 2022-07-30 DIAGNOSIS — J9611 Chronic respiratory failure with hypoxia: Secondary | ICD-10-CM | POA: Diagnosis present

## 2022-07-30 DIAGNOSIS — Z794 Long term (current) use of insulin: Secondary | ICD-10-CM

## 2022-07-30 DIAGNOSIS — Z923 Personal history of irradiation: Secondary | ICD-10-CM

## 2022-07-30 DIAGNOSIS — N1832 Chronic kidney disease, stage 3b: Secondary | ICD-10-CM | POA: Diagnosis present

## 2022-07-30 DIAGNOSIS — Z8349 Family history of other endocrine, nutritional and metabolic diseases: Secondary | ICD-10-CM

## 2022-07-30 DIAGNOSIS — E785 Hyperlipidemia, unspecified: Secondary | ICD-10-CM | POA: Diagnosis present

## 2022-07-30 DIAGNOSIS — I959 Hypotension, unspecified: Secondary | ICD-10-CM | POA: Diagnosis present

## 2022-07-30 DIAGNOSIS — Z803 Family history of malignant neoplasm of breast: Secondary | ICD-10-CM

## 2022-07-30 DIAGNOSIS — E890 Postprocedural hypothyroidism: Secondary | ICD-10-CM | POA: Diagnosis present

## 2022-07-30 DIAGNOSIS — I130 Hypertensive heart and chronic kidney disease with heart failure and stage 1 through stage 4 chronic kidney disease, or unspecified chronic kidney disease: Secondary | ICD-10-CM | POA: Diagnosis present

## 2022-07-30 DIAGNOSIS — F201 Disorganized schizophrenia: Secondary | ICD-10-CM | POA: Diagnosis present

## 2022-07-30 DIAGNOSIS — K219 Gastro-esophageal reflux disease without esophagitis: Secondary | ICD-10-CM | POA: Diagnosis present

## 2022-07-30 DIAGNOSIS — S37009S Unspecified injury of unspecified kidney, sequela: Secondary | ICD-10-CM

## 2022-07-30 DIAGNOSIS — Z8049 Family history of malignant neoplasm of other genital organs: Secondary | ICD-10-CM

## 2022-07-30 DIAGNOSIS — Z833 Family history of diabetes mellitus: Secondary | ICD-10-CM

## 2022-07-30 DIAGNOSIS — Z853 Personal history of malignant neoplasm of breast: Secondary | ICD-10-CM

## 2022-07-30 DIAGNOSIS — N2581 Secondary hyperparathyroidism of renal origin: Secondary | ICD-10-CM

## 2022-07-30 DIAGNOSIS — Z96653 Presence of artificial knee joint, bilateral: Secondary | ICD-10-CM | POA: Diagnosis present

## 2022-07-30 DIAGNOSIS — G894 Chronic pain syndrome: Secondary | ICD-10-CM | POA: Diagnosis present

## 2022-07-30 DIAGNOSIS — E039 Hypothyroidism, unspecified: Secondary | ICD-10-CM | POA: Diagnosis present

## 2022-07-30 DIAGNOSIS — Z7984 Long term (current) use of oral hypoglycemic drugs: Secondary | ICD-10-CM

## 2022-07-30 DIAGNOSIS — Z79899 Other long term (current) drug therapy: Secondary | ICD-10-CM

## 2022-07-30 DIAGNOSIS — J9620 Acute and chronic respiratory failure, unspecified whether with hypoxia or hypercapnia: Secondary | ICD-10-CM | POA: Diagnosis present

## 2022-07-30 DIAGNOSIS — R0902 Hypoxemia: Principal | ICD-10-CM

## 2022-07-30 DIAGNOSIS — E1142 Type 2 diabetes mellitus with diabetic polyneuropathy: Secondary | ICD-10-CM | POA: Diagnosis present

## 2022-07-30 DIAGNOSIS — Z91013 Allergy to seafood: Secondary | ICD-10-CM

## 2022-07-30 DIAGNOSIS — Z79891 Long term (current) use of opiate analgesic: Secondary | ICD-10-CM

## 2022-07-30 DIAGNOSIS — I5032 Chronic diastolic (congestive) heart failure: Secondary | ICD-10-CM | POA: Diagnosis present

## 2022-07-30 DIAGNOSIS — Z888 Allergy status to other drugs, medicaments and biological substances status: Secondary | ICD-10-CM

## 2022-07-30 DIAGNOSIS — E118 Type 2 diabetes mellitus with unspecified complications: Secondary | ICD-10-CM

## 2022-07-30 DIAGNOSIS — Z91199 Patient's noncompliance with other medical treatment and regimen due to unspecified reason: Secondary | ICD-10-CM

## 2022-07-30 DIAGNOSIS — M545 Low back pain, unspecified: Secondary | ICD-10-CM | POA: Diagnosis present

## 2022-07-30 DIAGNOSIS — G219 Secondary parkinsonism, unspecified: Secondary | ICD-10-CM | POA: Diagnosis present

## 2022-07-30 DIAGNOSIS — Z8249 Family history of ischemic heart disease and other diseases of the circulatory system: Secondary | ICD-10-CM

## 2022-07-30 DIAGNOSIS — J4530 Mild persistent asthma, uncomplicated: Secondary | ICD-10-CM | POA: Diagnosis present

## 2022-07-30 DIAGNOSIS — J9621 Acute and chronic respiratory failure with hypoxia: Secondary | ICD-10-CM | POA: Diagnosis present

## 2022-07-30 DIAGNOSIS — Z7985 Long-term (current) use of injectable non-insulin antidiabetic drugs: Secondary | ICD-10-CM

## 2022-07-30 DIAGNOSIS — R413 Other amnesia: Secondary | ICD-10-CM | POA: Diagnosis present

## 2022-07-30 DIAGNOSIS — N179 Acute kidney failure, unspecified: Secondary | ICD-10-CM | POA: Diagnosis not present

## 2022-07-30 DIAGNOSIS — E119 Type 2 diabetes mellitus without complications: Secondary | ICD-10-CM

## 2022-07-30 DIAGNOSIS — Z87891 Personal history of nicotine dependence: Secondary | ICD-10-CM

## 2022-07-30 DIAGNOSIS — Z9049 Acquired absence of other specified parts of digestive tract: Secondary | ICD-10-CM

## 2022-07-30 DIAGNOSIS — Z7982 Long term (current) use of aspirin: Secondary | ICD-10-CM

## 2022-07-30 DIAGNOSIS — E1122 Type 2 diabetes mellitus with diabetic chronic kidney disease: Secondary | ICD-10-CM | POA: Diagnosis present

## 2022-07-30 DIAGNOSIS — Z8711 Personal history of peptic ulcer disease: Secondary | ICD-10-CM

## 2022-07-30 DIAGNOSIS — Z825 Family history of asthma and other chronic lower respiratory diseases: Secondary | ICD-10-CM

## 2022-07-30 DIAGNOSIS — L299 Pruritus, unspecified: Secondary | ICD-10-CM | POA: Diagnosis present

## 2022-07-30 DIAGNOSIS — E662 Morbid (severe) obesity with alveolar hypoventilation: Secondary | ICD-10-CM | POA: Diagnosis present

## 2022-07-30 DIAGNOSIS — Z79890 Hormone replacement therapy (postmenopausal): Secondary | ICD-10-CM

## 2022-07-30 LAB — HEPATIC FUNCTION PANEL
ALT: 22 U/L (ref 0–44)
AST: 19 U/L (ref 15–41)
Albumin: 3.6 g/dL (ref 3.5–5.0)
Alkaline Phosphatase: 71 U/L (ref 38–126)
Bilirubin, Direct: <0.1 mg/dL (ref 0.0–0.2)
Total Bilirubin: 0.3 mg/dL (ref 0.3–1.2)
Total Protein: 6.6 g/dL (ref 6.5–8.1)

## 2022-07-30 LAB — I-STAT VENOUS BLOOD GAS, ED
Acid-base deficit: 3 mmol/L — ABNORMAL HIGH (ref 0.0–2.0)
Bicarbonate: 21.3 mmol/L (ref 20.0–28.0)
Calcium, Ion: 1 mmol/L — ABNORMAL LOW (ref 1.15–1.40)
HCT: 30 % — ABNORMAL LOW (ref 36.0–46.0)
Hemoglobin: 10.2 g/dL — ABNORMAL LOW (ref 12.0–15.0)
O2 Saturation: 94 %
Potassium: 3.7 mmol/L (ref 3.5–5.1)
Sodium: 132 mmol/L — ABNORMAL LOW (ref 135–145)
TCO2: 22 mmol/L (ref 22–32)
pCO2, Ven: 34.2 mmHg — ABNORMAL LOW (ref 44–60)
pH, Ven: 7.402 (ref 7.25–7.43)
pO2, Ven: 68 mmHg — ABNORMAL HIGH (ref 32–45)

## 2022-07-30 LAB — CBC
HCT: 30.7 % — ABNORMAL LOW (ref 36.0–46.0)
Hemoglobin: 9.6 g/dL — ABNORMAL LOW (ref 12.0–15.0)
MCH: 26.3 pg (ref 26.0–34.0)
MCHC: 31.3 g/dL (ref 30.0–36.0)
MCV: 84.1 fL (ref 80.0–100.0)
Platelets: 195 10*3/uL (ref 150–400)
RBC: 3.65 MIL/uL — ABNORMAL LOW (ref 3.87–5.11)
RDW: 17 % — ABNORMAL HIGH (ref 11.5–15.5)
WBC: 8.1 10*3/uL (ref 4.0–10.5)
nRBC: 0 % (ref 0.0–0.2)

## 2022-07-30 LAB — BASIC METABOLIC PANEL
Anion gap: 16 — ABNORMAL HIGH (ref 5–15)
BUN: 59 mg/dL — ABNORMAL HIGH (ref 8–23)
CO2: 21 mmol/L — ABNORMAL LOW (ref 22–32)
Calcium: 9 mg/dL (ref 8.9–10.3)
Chloride: 97 mmol/L — ABNORMAL LOW (ref 98–111)
Creatinine, Ser: 2.83 mg/dL — ABNORMAL HIGH (ref 0.44–1.00)
GFR, Estimated: 18 mL/min — ABNORMAL LOW (ref >60)
Glucose, Bld: 138 mg/dL — ABNORMAL HIGH (ref 70–99)
Potassium: 3.7 mmol/L (ref 3.5–5.1)
Sodium: 134 mmol/L — ABNORMAL LOW (ref 135–145)

## 2022-07-30 LAB — I-STAT CHEM 8, ED
BUN: 57 mg/dL — ABNORMAL HIGH (ref 8–23)
Calcium, Ion: 1.02 mmol/L — ABNORMAL LOW (ref 1.15–1.40)
Chloride: 102 mmol/L (ref 98–111)
Creatinine, Ser: 3.1 mg/dL — ABNORMAL HIGH (ref 0.44–1.00)
Glucose, Bld: 140 mg/dL — ABNORMAL HIGH (ref 70–99)
HCT: 31 % — ABNORMAL LOW (ref 36.0–46.0)
Hemoglobin: 10.5 g/dL — ABNORMAL LOW (ref 12.0–15.0)
Potassium: 3.7 mmol/L (ref 3.5–5.1)
Sodium: 132 mmol/L — ABNORMAL LOW (ref 135–145)
TCO2: 21 mmol/L — ABNORMAL LOW (ref 22–32)

## 2022-07-30 LAB — BRAIN NATRIURETIC PEPTIDE: B Natriuretic Peptide: 8.3 pg/mL (ref 0.0–100.0)

## 2022-07-30 LAB — TROPONIN I (HIGH SENSITIVITY): Troponin I (High Sensitivity): 5 ng/L (ref <18)

## 2022-07-30 MED ORDER — SODIUM CHLORIDE 0.9 % IV BOLUS
500.0000 mL | Freq: Once | INTRAVENOUS | Status: AC
  Administered 2022-07-30: 500 mL via INTRAVENOUS

## 2022-07-30 MED ORDER — IPRATROPIUM-ALBUTEROL 0.5-2.5 (3) MG/3ML IN SOLN
3.0000 mL | Freq: Once | RESPIRATORY_TRACT | Status: AC
  Administered 2022-07-30: 3 mL via RESPIRATORY_TRACT
  Filled 2022-07-30: qty 3

## 2022-07-30 NOTE — ED Triage Notes (Signed)
Pt BIBGEMS from home after having sudden onset of chest pain that is a pressure approximately starting one hour ago. Pt is alert and oriented but fatigued during conversation reporting feeling more tired than usual. Clear and equal lung sounds bilaterally. Pain went from 9 to a 5 with oxygen  89/59 BP 90 palp 88 HR  Hx CHF and lymphedema  ASA 324  Oxygen 2 LPM [place] to 4 LPM [place] due to desaturation

## 2022-07-30 NOTE — Progress Notes (Signed)
Responded to consult for IV. Per RN, MD obtained access. Consult cleared.

## 2022-07-31 ENCOUNTER — Observation Stay (HOSPITAL_COMMUNITY): Payer: 59

## 2022-07-31 DIAGNOSIS — N189 Chronic kidney disease, unspecified: Secondary | ICD-10-CM

## 2022-07-31 DIAGNOSIS — E1122 Type 2 diabetes mellitus with diabetic chronic kidney disease: Secondary | ICD-10-CM | POA: Diagnosis present

## 2022-07-31 DIAGNOSIS — N1832 Chronic kidney disease, stage 3b: Secondary | ICD-10-CM

## 2022-07-31 DIAGNOSIS — T502X5A Adverse effect of carbonic-anhydrase inhibitors, benzothiadiazides and other diuretics, initial encounter: Secondary | ICD-10-CM | POA: Diagnosis present

## 2022-07-31 DIAGNOSIS — Z96653 Presence of artificial knee joint, bilateral: Secondary | ICD-10-CM | POA: Diagnosis present

## 2022-07-31 DIAGNOSIS — J4530 Mild persistent asthma, uncomplicated: Secondary | ICD-10-CM | POA: Diagnosis present

## 2022-07-31 DIAGNOSIS — E785 Hyperlipidemia, unspecified: Secondary | ICD-10-CM | POA: Diagnosis present

## 2022-07-31 DIAGNOSIS — Z923 Personal history of irradiation: Secondary | ICD-10-CM | POA: Diagnosis not present

## 2022-07-31 DIAGNOSIS — E1142 Type 2 diabetes mellitus with diabetic polyneuropathy: Secondary | ICD-10-CM | POA: Diagnosis present

## 2022-07-31 DIAGNOSIS — F201 Disorganized schizophrenia: Secondary | ICD-10-CM | POA: Diagnosis present

## 2022-07-31 DIAGNOSIS — I272 Pulmonary hypertension, unspecified: Secondary | ICD-10-CM | POA: Diagnosis not present

## 2022-07-31 DIAGNOSIS — R0602 Shortness of breath: Secondary | ICD-10-CM | POA: Diagnosis present

## 2022-07-31 DIAGNOSIS — N179 Acute kidney failure, unspecified: Secondary | ICD-10-CM | POA: Diagnosis present

## 2022-07-31 DIAGNOSIS — G219 Secondary parkinsonism, unspecified: Secondary | ICD-10-CM | POA: Diagnosis present

## 2022-07-31 DIAGNOSIS — J9611 Chronic respiratory failure with hypoxia: Secondary | ICD-10-CM

## 2022-07-31 DIAGNOSIS — Z794 Long term (current) use of insulin: Secondary | ICD-10-CM | POA: Diagnosis not present

## 2022-07-31 DIAGNOSIS — I5032 Chronic diastolic (congestive) heart failure: Secondary | ICD-10-CM | POA: Diagnosis present

## 2022-07-31 DIAGNOSIS — J9620 Acute and chronic respiratory failure, unspecified whether with hypoxia or hypercapnia: Secondary | ICD-10-CM | POA: Diagnosis present

## 2022-07-31 DIAGNOSIS — L299 Pruritus, unspecified: Secondary | ICD-10-CM | POA: Diagnosis present

## 2022-07-31 DIAGNOSIS — F32A Depression, unspecified: Secondary | ICD-10-CM | POA: Diagnosis present

## 2022-07-31 DIAGNOSIS — I959 Hypotension, unspecified: Secondary | ICD-10-CM | POA: Diagnosis present

## 2022-07-31 DIAGNOSIS — E890 Postprocedural hypothyroidism: Secondary | ICD-10-CM | POA: Diagnosis present

## 2022-07-31 DIAGNOSIS — G894 Chronic pain syndrome: Secondary | ICD-10-CM

## 2022-07-31 DIAGNOSIS — Z853 Personal history of malignant neoplasm of breast: Secondary | ICD-10-CM

## 2022-07-31 DIAGNOSIS — Z8249 Family history of ischemic heart disease and other diseases of the circulatory system: Secondary | ICD-10-CM | POA: Diagnosis not present

## 2022-07-31 DIAGNOSIS — I130 Hypertensive heart and chronic kidney disease with heart failure and stage 1 through stage 4 chronic kidney disease, or unspecified chronic kidney disease: Secondary | ICD-10-CM | POA: Diagnosis present

## 2022-07-31 DIAGNOSIS — Z87891 Personal history of nicotine dependence: Secondary | ICD-10-CM | POA: Diagnosis not present

## 2022-07-31 DIAGNOSIS — E662 Morbid (severe) obesity with alveolar hypoventilation: Secondary | ICD-10-CM | POA: Diagnosis present

## 2022-07-31 DIAGNOSIS — J9621 Acute and chronic respiratory failure with hypoxia: Secondary | ICD-10-CM | POA: Diagnosis present

## 2022-07-31 LAB — CBC
HCT: 29.3 % — ABNORMAL LOW (ref 36.0–46.0)
Hemoglobin: 9.4 g/dL — ABNORMAL LOW (ref 12.0–15.0)
MCH: 26.9 pg (ref 26.0–34.0)
MCHC: 32.1 g/dL (ref 30.0–36.0)
MCV: 84 fL (ref 80.0–100.0)
Platelets: 182 10*3/uL (ref 150–400)
RBC: 3.49 MIL/uL — ABNORMAL LOW (ref 3.87–5.11)
RDW: 16.7 % — ABNORMAL HIGH (ref 11.5–15.5)
WBC: 6.8 10*3/uL (ref 4.0–10.5)
nRBC: 0 % (ref 0.0–0.2)

## 2022-07-31 LAB — TROPONIN I (HIGH SENSITIVITY): Troponin I (High Sensitivity): 4 ng/L (ref <18)

## 2022-07-31 LAB — BASIC METABOLIC PANEL
Anion gap: 12 (ref 5–15)
BUN: 50 mg/dL — ABNORMAL HIGH (ref 8–23)
CO2: 24 mmol/L (ref 22–32)
Calcium: 8.3 mg/dL — ABNORMAL LOW (ref 8.9–10.3)
Chloride: 98 mmol/L (ref 98–111)
Creatinine, Ser: 2.15 mg/dL — ABNORMAL HIGH (ref 0.44–1.00)
GFR, Estimated: 25 mL/min — ABNORMAL LOW (ref >60)
Glucose, Bld: 167 mg/dL — ABNORMAL HIGH (ref 70–99)
Potassium: 3.2 mmol/L — ABNORMAL LOW (ref 3.5–5.1)
Sodium: 134 mmol/L — ABNORMAL LOW (ref 135–145)

## 2022-07-31 LAB — BLOOD GAS, ARTERIAL
Acid-Base Excess: 5.8 mmol/L — ABNORMAL HIGH (ref 0.0–2.0)
Bicarbonate: 31.2 mmol/L — ABNORMAL HIGH (ref 20.0–28.0)
O2 Saturation: 97.7 %
Patient temperature: 36.3
pCO2 arterial: 46 mmHg (ref 32–48)
pH, Arterial: 7.44 (ref 7.35–7.45)
pO2, Arterial: 79 mmHg — ABNORMAL LOW (ref 83–108)

## 2022-07-31 LAB — GLUCOSE, CAPILLARY
Glucose-Capillary: 89 mg/dL (ref 70–99)
Glucose-Capillary: 133 mg/dL — ABNORMAL HIGH (ref 70–99)
Glucose-Capillary: 78 mg/dL (ref 70–99)
Glucose-Capillary: 91 mg/dL (ref 70–99)
Glucose-Capillary: 117 mg/dL — ABNORMAL HIGH (ref 70–99)

## 2022-07-31 LAB — CBG MONITORING, ED: Glucose-Capillary: 135 mg/dL — ABNORMAL HIGH (ref 70–99)

## 2022-07-31 LAB — MRSA NEXT GEN BY PCR, NASAL: MRSA by PCR Next Gen: DETECTED — AB

## 2022-07-31 MED ORDER — MUPIROCIN 2 % EX OINT
1.0000 | TOPICAL_OINTMENT | Freq: Two times a day (BID) | CUTANEOUS | Status: DC
  Administered 2022-07-31 – 2022-08-02 (×5): 1 via NASAL
  Filled 2022-07-31: qty 22

## 2022-07-31 MED ORDER — HYDROXYZINE HCL 25 MG PO TABS
25.0000 mg | ORAL_TABLET | Freq: Once | ORAL | Status: AC
  Administered 2022-07-31: 25 mg via ORAL
  Filled 2022-07-31: qty 1

## 2022-07-31 MED ORDER — ASPIRIN 81 MG PO TBEC
81.0000 mg | DELAYED_RELEASE_TABLET | Freq: Every day | ORAL | Status: DC
  Administered 2022-07-31 – 2022-08-02 (×3): 81 mg via ORAL
  Filled 2022-07-31 (×3): qty 1

## 2022-07-31 MED ORDER — VALACYCLOVIR HCL 500 MG PO TABS
1000.0000 mg | ORAL_TABLET | Freq: Every day | ORAL | Status: DC
  Administered 2022-07-31 – 2022-08-02 (×3): 1000 mg via ORAL
  Filled 2022-07-31 (×3): qty 2

## 2022-07-31 MED ORDER — ZIPRASIDONE HCL 20 MG PO CAPS
60.0000 mg | ORAL_CAPSULE | Freq: Every day | ORAL | Status: DC
  Administered 2022-07-31 – 2022-08-01 (×2): 60 mg via ORAL
  Filled 2022-07-31 (×3): qty 3

## 2022-07-31 MED ORDER — NALOXONE HCL 4 MG/0.1ML NA LIQD: 1.0000 | NASAL | Status: DC | PRN

## 2022-07-31 MED ORDER — AMLODIPINE BESYLATE 10 MG PO TABS
10.0000 mg | ORAL_TABLET | Freq: Every day | ORAL | Status: DC
  Administered 2022-07-31 – 2022-08-02 (×3): 10 mg via ORAL
  Filled 2022-07-31 (×3): qty 1

## 2022-07-31 MED ORDER — CHLORHEXIDINE GLUCONATE CLOTH 2 % EX PADS
6.0000 | MEDICATED_PAD | Freq: Every day | CUTANEOUS | Status: DC
  Administered 2022-07-31 – 2022-08-02 (×3): 6 via TOPICAL

## 2022-07-31 MED ORDER — MIRABEGRON ER 25 MG PO TB24
25.0000 mg | ORAL_TABLET | Freq: Every day | ORAL | Status: DC
  Administered 2022-07-31 – 2022-08-02 (×3): 25 mg via ORAL
  Filled 2022-07-31 (×3): qty 1

## 2022-07-31 MED ORDER — HYDROMORPHONE HCL 1 MG/ML IJ SOLN
0.5000 mg | INTRAMUSCULAR | Status: DC | PRN
  Administered 2022-07-31: 0.5 mg via INTRAVENOUS
  Filled 2022-07-31: qty 1

## 2022-07-31 MED ORDER — PANTOPRAZOLE SODIUM 40 MG PO TBEC
40.0000 mg | DELAYED_RELEASE_TABLET | Freq: Every day | ORAL | Status: DC
  Administered 2022-07-31 – 2022-08-02 (×3): 40 mg via ORAL
  Filled 2022-07-31 (×3): qty 1

## 2022-07-31 MED ORDER — SENNOSIDES-DOCUSATE SODIUM 8.6-50 MG PO TABS: 1.0000 | ORAL_TABLET | Freq: Every evening | ORAL | Status: DC | PRN

## 2022-07-31 MED ORDER — VENLAFAXINE HCL ER 75 MG PO CP24
150.0000 mg | ORAL_CAPSULE | Freq: Two times a day (BID) | ORAL | Status: DC
  Administered 2022-07-31 – 2022-08-02 (×5): 150 mg via ORAL
  Filled 2022-07-31 (×5): qty 2

## 2022-07-31 MED ORDER — ACETAMINOPHEN 650 MG RE SUPP: 650.0000 mg | Freq: Four times a day (QID) | RECTAL | Status: DC | PRN

## 2022-07-31 MED ORDER — INSULIN NPH (HUMAN) (ISOPHANE) 100 UNIT/ML ~~LOC~~ SUSP
25.0000 [IU] | Freq: Every day | SUBCUTANEOUS | Status: DC
  Administered 2022-07-31 – 2022-08-01 (×2): 25 [IU] via SUBCUTANEOUS
  Filled 2022-07-31: qty 10

## 2022-07-31 MED ORDER — LEVOTHYROXINE SODIUM 100 MCG PO TABS
200.0000 ug | ORAL_TABLET | Freq: Every day | ORAL | Status: DC
  Administered 2022-07-31 – 2022-08-02 (×3): 200 ug via ORAL
  Filled 2022-07-31 (×3): qty 2

## 2022-07-31 MED ORDER — POLYETHYLENE GLYCOL 3350 17 G PO PACK: 17.0000 g | PACK | Freq: Every day | ORAL | Status: DC | PRN

## 2022-07-31 MED ORDER — BUPRENORPHINE HCL 750 MCG BU FILM: 750.0000 ug | ORAL_FILM | Freq: Two times a day (BID) | BUCCAL | Status: DC

## 2022-07-31 MED ORDER — LACTATED RINGERS IV SOLN: INTRAVENOUS | Status: DC

## 2022-07-31 MED ORDER — ROSUVASTATIN CALCIUM 20 MG PO TABS
20.0000 mg | ORAL_TABLET | Freq: Every day | ORAL | Status: DC
  Administered 2022-07-31 – 2022-08-02 (×3): 20 mg via ORAL
  Filled 2022-07-31 (×3): qty 1

## 2022-07-31 MED ORDER — INSULIN ASPART 100 UNIT/ML IJ SOLN: 0.0000 [IU] | Freq: Three times a day (TID) | INTRAMUSCULAR | Status: DC

## 2022-07-31 MED ORDER — POTASSIUM CHLORIDE 20 MEQ PO PACK
40.0000 meq | PACK | Freq: Once | ORAL | Status: AC
  Administered 2022-07-31: 40 meq via ORAL
  Filled 2022-07-31: qty 2

## 2022-07-31 MED ORDER — INSULIN ASPART 100 UNIT/ML IJ SOLN: 0.0000 [IU] | Freq: Every day | INTRAMUSCULAR | Status: DC

## 2022-07-31 MED ORDER — ONDANSETRON HCL 4 MG PO TABS: 4.0000 mg | ORAL_TABLET | Freq: Four times a day (QID) | ORAL | Status: DC | PRN

## 2022-07-31 MED ORDER — HEPARIN SODIUM (PORCINE) 5000 UNIT/ML IJ SOLN
5000.0000 [IU] | Freq: Three times a day (TID) | INTRAMUSCULAR | Status: DC
  Administered 2022-07-31 – 2022-08-01 (×5): 5000 [IU] via SUBCUTANEOUS
  Filled 2022-07-31 (×4): qty 1

## 2022-07-31 MED ORDER — OXYCODONE HCL 5 MG PO TABS
5.0000 mg | ORAL_TABLET | Freq: Four times a day (QID) | ORAL | Status: DC | PRN
  Administered 2022-07-31 – 2022-08-01 (×3): 5 mg via ORAL
  Filled 2022-07-31 (×3): qty 1

## 2022-07-31 MED ORDER — ONDANSETRON HCL 4 MG/2ML IJ SOLN: 4.0000 mg | Freq: Four times a day (QID) | INTRAMUSCULAR | Status: DC | PRN

## 2022-07-31 MED ORDER — ACETAMINOPHEN 325 MG PO TABS
650.0000 mg | ORAL_TABLET | Freq: Four times a day (QID) | ORAL | Status: DC | PRN
  Administered 2022-07-31: 650 mg via ORAL
  Filled 2022-07-31: qty 2

## 2022-07-31 MED ORDER — PREGABALIN 100 MG PO CAPS
300.0000 mg | ORAL_CAPSULE | Freq: Two times a day (BID) | ORAL | Status: DC
  Administered 2022-07-31 – 2022-08-02 (×5): 300 mg via ORAL
  Filled 2022-07-31 (×5): qty 3

## 2022-07-31 NOTE — Care Management Obs Status (Signed)
MEDICARE OBSERVATION STATUS NOTIFICATION   Patient Details  Name: [name] MRN: [id number] Date of Birth: December 20, 1955   Medicare Observation Status Notification Given:  Yes    Tom-Johnson, Hershal Coria, RN 07/31/2022, 3:22 PM

## 2022-07-31 NOTE — H&P (Addendum)
PCP:   Jamey Reas, PA-C   Chief Complaint:  This of breath  HPI: This is a 67 year old female with past medical history significant for morbid obesity, chronic pain on daily opiates, CKD IIIb, hypothyroidism, dCHF, HTN and OSA on CPAP.  Patient recently admitted send select 6/4-6/7 for hypoxia likely related to OSA/OHS/HFpEF.  Patient diuresed and discharged with home oxygen.  At the time of admission, patient stated she had previously been discharged with home oxygen but did not receive it.  At the time of discharge 6/7 patient noted to have been stable off oxygen for 2 days.  Per chart patient 'does not appear to require O2 at rest at this time, largely nonambulatory'.  Patient not discharged on oxygen.  Per patient she has done so weak, despite the fact that she was drinking water but she kept feeling thirsty.  Today she was going down the hall in her wheelchair but was unable to keep her direction straight.  She kept going into the wall.  The more this occurred, the more upset she became in the more short of breath she became.  She was almost out of breath, she called 911.  She denies any fever, chills, nausea, vomiting or diarrhea.  Per patient she has been short of breath since discharge but significantly worse today.  She endorses mild wheeze since yesterday.  No cough.  She states her urine output has been normal.  She additionally complains of itching and stinging between her buttocks that started around 2 PM today.  On arrival patient hypoxic into the 80s, she was placed on a BiPAP.  Her initial blood pressure was decreased with systolic blood pressure in the 80s.  She received 1 L normal saline bolus. In the ER patient presenting blood pressure 100/54, HR 81, RR between 8-18, temperature afebrile.  Patient placed on 5 L oxygen, satting 100%. Creatinine 3.10, at discharge 1.77 [07/25/2022].  Review of Systems:  Per HPI  Past Medical History: Past Medical History:  Diagnosis  Date   Anemia    Anxiety and depression    Asthma    Breast cancer (HCC) 02/13/2012   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   partial   benign cyst  Left    removed from left breast   BREAST BIOPSY Right 02/13/2012   BREAST LUMPECTOMY  03/03/2012   Procedure: LUMPECTOMY;  Surgeon: Currie Paris, MD;  Location: [facility] OR;  Service: General;  Laterality: Right;   BREAST SURGERY Right 01/2012   "cancer"   CHOLECYSTECTOMY  1980's?   COLONOSCOPY WITH PROPOFOL N/A 09/28/2012  Procedure: COLONOSCOPY WITH PROPOFOL;  Surgeon: Shirley Friar, MD;  Location: [facility];  Service: Endoscopy;  Laterality: N/A;   FOOT FRACTURE SURGERY Right 1990's   JOINT REPLACEMENT      x 3   LESION EXCISION N/A 03/13/2021   Procedure: EXCISION SCALP SEBACEOUS CYSTS x2;  Surgeon: Axel Filler, MD;  Location: [facility] OR;  Service: General;  Laterality: N/A;  60 MINUTES LOCAL & MAC   LUMBAR LAMINECTOMY/DECOMPRESSION MICRODISCECTOMY N/A 12/05/2016   Procedure: LEFT L5-S1 MICRODISCECTOMY;  Surgeon: Eldred Manges, MD;  Location: [facility] OR;  Service: Orthopedics;  Laterality: N/A;   REVISION TOTAL KNEE ARTHROPLASTY  07/2009   SHOULDER OPEN ROTATOR CUFF REPAIR Left  1990's   THYROIDECTOMY  1970's   TOTAL KNEE ARTHROPLASTY Bilateral 2009-06/2009   left; right    Medications: Prior to Admission medications   Medication Sig Start Date End Date Taking? Authorizing Provider  acetaminophen (TYLENOL) 650 MG CR tablet Take 1,300 mg by mouth every 8 (eight) hours as needed for pain. Patient not taking: Reported on 07/22/2022    [provider]  albuterol (VENTOLIN HFA) 108 (90 Base) MCG/ACT inhaler Inhale 2 puffs into the lungs every 4 (four) hours as needed for wheezing or shortness of breath.     [provider]  aspirin EC 81 MG tablet Take 1 tablet (81 mg total) by mouth daily. 08/12/13   Viyuoh, Rolland Bimler, MD  Cholecalciferol (VITAMIN D-3) 1000 units CAPS Take 1,000 Units by mouth daily.    [provider]  Dulaglutide (TRULICITY) 4.5 MG/0.5ML SOPN Inject 4.5 mg into the skin once a week. 10/17/21   Carlus Pavlov, MD  esomeprazole (NEXIUM) 40 MG capsule Take 1 capsule (40 mg total) by mouth daily before breakfast. 10/30/14   Vassie Loll, MD  famotidine (PEPCID) 40 MG tablet Take 40 mg by mouth daily after supper. 06/16/20   [provider]  ferrous sulfate 325 (65 FE) MG tablet Take 325 mg by mouth in the morning.    [provider]  furosemide (LASIX) 40 MG tablet Take 2 tablets (80 mg total) by mouth daily. 07/25/22   Zannie Cove, MD  glucose blood [facility] VERIO) test strip TEST BLOOD SUGAR THREE TIMES DAILY 03/12/22   Carlus Pavlov, MD  Insulin NPH, Human,, Isophane, (HUMULIN N KWIKPEN) 100 UNIT/ML Kiwkpen Inject 25 Units into the skin every morning. And pen needles 1/day 07/25/22   Zannie Cove, MD  Lancets [facility] DELICA PLUS [place]) MISC 1 each by Other route 3 (three) times daily. Use to check blood sugar 3 times a day 05/23/22   Carlus Pavlov, MD  levothyroxine (SYNTHROID) 200 MCG tablet Take 1 tablet (200 mcg total) by mouth daily before breakfast. 04/25/22   Carlus Pavlov, MD  linaclotide  (LINZESS) 290 MCG CAPS capsule Take 290 mcg by mouth daily before breakfast.    [provider]  LORazepam (ATIVAN) 1 MG tablet Take 0.5 tablets (0.5 mg total) by mouth 2 (two) times daily as needed for anxiety. 07/25/22   Zannie Cove, MD  mirabegron ER (MYRBETRIQ) 25 MG TB24 tablet Take 25 mg by mouth in the morning.    [provider]  naloxone [facility]) nasal spray 4 mg/0.1 mL Place 1 spray into the nose as needed for opioid reversal. 08/01/20   [provider]  polyvinyl alcohol (LIQUIFILM TEARS) 1.4 % ophthalmic solution 1 drop as needed for dry eyes.    [provider]  potassium chloride SA (KLOR-CON) 20 MEQ tablet Take 20  mEq by mouth daily. 06/22/20   [provider]  pregabalin (LYRICA) 300 MG capsule Take 300 mg by mouth 2 (two) times daily.    [provider]  rosuvastatin (CRESTOR) 20 MG tablet Take 20 mg by mouth daily.    [provider]  SURE COMFORT PEN NEEDLES 32G X 6 MM MISC USE TO INJECT INSULIN FOUR TIMES DAILY 03/13/21   Romero Belling, MD  tolterodine (DETROL LA) 2 MG 24 hr capsule Take 2 mg by mouth every evening. 09/24/20   [provider]  valACYclovir (VALTREX) 1000 MG tablet Take 1,000 mg by mouth daily.    [provider]  venlafaxine XR (EFFEXOR-XR) 150 MG 24 hr capsule Take 150 mg by mouth 2 (two) times daily. 05/21/15   [provider]  ziprasidone (GEODON) 60 MG capsule Take 60 mg by mouth daily after supper.    [provider]    Allergies:   Allergies  Allergen Reactions   Bee Venom Anaphylaxis   Invokana [Canagliflozin] Anaphylaxis    Dyspnea and urinary retention   Shellfish Allergy Anaphylaxis   Buprenorphine Hcl Other (See Comments)    Overly sedated with morphine drip.   Other Other (See Comments)    Cats - bad asthma, stopped up    Social History:  reports that she quit smoking about 30 years ago. Her smoking use included cigarettes. She has a 7.00  pack-year smoking history. She has never used smokeless tobacco. She reports that she does not drink alcohol and does not use drugs.  Family History: Family History  Problem Relation Age of Onset   Heart disease Brother        Multiple MIs, starting in his 89s   Diabetes Brother    Thyroid disease Brother    Hypertension Mother    Diabetes Mother    Breast cancer Mother 39   Bone cancer Mother    Hypertension Sister    Diabetes Sister    Breast cancer Sister 9   Thyroid disease Sister    Breast cancer Maternal Grandmother    Heart disease Maternal Grandmother    Uterine cancer Other 19   Breast cancer Paternal Aunt 55   Breast cancer Paternal Grandmother        dx in her 34s   Prostate cancer Paternal Grandfather    Asthma Son     Physical Exam: Vitals:   07/30/22 2120 07/30/22 2215 07/30/22 2230 07/30/22 2245  BP: (!) 85/47 (!) 99/51 (!) 104/52 (!) 106/56  Pulse: 82 82 81 77  Resp: 18 16 (!) 9 10  Temp:      TempSrc:      SpO2: 100% 100% 100% 98%  Weight:      Height:        General:  Alert and oriented times three, extreme morbidly obese female, no acute distress Eyes: Pink conjunctiva, no scleral icterus ENT: Moist oral mucosa, neck supple, no thyromegaly Lungs: clear to ascultation, no wheeze, no crackles, no use of accessory muscles Cardiovascular: regular rate and rhythm, no regurgitation, no gallops, no murmurs. No carotid bruits, no JVD Abdomen: soft, positive BS, non-tender, non-distended, no organomegaly, not an acute abdomen GU: not examined Neuro: CN II - XII grossly intact, sensation intact Musculoskeletal: strength 4/5 all extremities, no clubbing, cyanosis or edema Skin/buttock: Skin intact, right in the buttock area of shaggy light grayish skin, not broken.  Chronic lower extremity edema with darkening skin changes Psych: appropriate patient  Labs on Admission:  Recent Labs    07/30/22 2117 07/30/22 2122  NA 134* 132*  132*  K 3.7 3.7  3.7   CL 97* 102  CO2 21*  --   GLUCOSE 138* 140*  BUN 59* 57*  CREATININE 2.83* 3.10*  CALCIUM 9.0  --    Recent Labs    07/30/22 2140  AST 19  ALT 22  ALKPHOS 71  BILITOT 0.3  PROT 6.6  ALBUMIN 3.6    Recent Labs    07/30/22 2117 07/30/22 2122  WBC 8.1  --   HGB 9.6* 10.5*  10.2*  HCT 30.7* 31.0*  30.0*  MCV 84.1  --   PLT 195  --     Micro Results: Recent Results (from the past 240 hour(s))  SARS Coronavirus 2 by RT PCR (hospital order, performed in [facility] hospital lab) *cepheid single result test* Anterior Nasal Swab     Status: None   Collection Time: 07/22/22 10:52 AM   Specimen: Anterior Nasal Swab  Result Value Ref Range Status   SARS Coronavirus 2 by RT PCR NEGATIVE NEGATIVE Final    Comment: Performed at [facility], [address], [place], [place] [address]     Radiological Exams on Admission: DG Chest Portable 1 View  Result Date: 07/30/2022 CLINICAL DATA:  Chest pain. EXAM: PORTABLE CHEST 1 VIEW COMPARISON:  Radiograph 07/24/2022 FINDINGS: Mild cardiomegaly. Stable mediastinal contours. Improved vascular congestion from prior. Left lung base atelectasis or scarring. No new airspace disease. No pneumothorax or pleural effusion. Surgical clips at the right thoracic inlet. IMPRESSION: Mild cardiomegaly. Improved vascular congestion from prior. Left lung base atelectasis or scarring. Electronically Signed   By: Narda Rutherford M.D.   On: 07/30/2022 21:20    Assessment/Plan Present on Admission:  Acute on chronic kidney injury stage IIIb -Likely secondary to diuresis -Will Lasix, gentle IV fluid hydration for 5 hours. -BMP in a.m. I's and O's. -Explained to patient, worsening renal function will occur with diuresis. -avoid nephrotoxic medications   Acute respiratory failure with hypoxia (HCC) -Patient placed on oxygen 5L. CXR only done in ER reads - mild cardiomegaly. Improved vascular congestion from prior. Left lung base atelectasis or  scarring. -BNP unreliable.  Not done -Unclear if patient needs to be maintained on chronic oxygen or not.  Either way this is an exacerbation of patient's underlying oxygen needs.  On patient's discharge 09/13/2021 she she had been ordered 2 to 3 L nasal cannula consistently. -Patient is without complaint or evidence of infection. -Will order a CT chest without contrast for better evaluation.  This can be done in a.m.  Itchy inner buttock -No decub noted however darker colored skin changes with irregular "shaggy" appearance, consistent with moisture associated skin damage and "chronic tissue damage.  This was notation by wound care last admission.  Patient complained of itching and sharp discomfort then. -As needed Vistaril and moisturizer ordered   Chronic diastolic CHF (congestive heart failure) (HCC) -On lasix PO -held -Poor candidate for SGLT2 with body habitus and bedbound status -GDMT limited by CKD  OSA/OHS -CPAP ordered   Chronic pain syndrome -Continue Belbuca, tramadol, Lyrica, cyclobenzaprine (qhs)/methocarbamol.   Diabetes mellitus type 2/diabetic polyneuropathy (HCC) -Sliding scale insulin resumed -NPH continued -Trulicity on hold   Disorganized schizophrenia (HCC) -Doxepin, Ativan, Effexor, Geodon continued -PRN ativan cont. at a lower dose   HLD (hyperlipidemia) -Crestor continued   Hypothyroidism -Synthroid continued  Mild persistent asthma FEV1 50% in 2018.   -PRN bronchodilators  Remote H/o breast cancer (HCC)  Robbie Rideaux 07/31/2022, 12:19 AM

## 2022-07-31 NOTE — Progress Notes (Signed)
Brief progress note -Admitted earlier today with hypoxic respiratory failure. -As per H&P done on admission: "This is a 67 year old female with past medical history significant for morbid obesity, chronic pain on daily opiates, CKD IIIb, hypothyroidism, dCHF, HTN and OSA on CPAP.  Patient recently admitted send select 6/4-6/7 for hypoxia likely related to OSA/OHS/HFpEF.  Patient diuresed and discharged with home oxygen.  At the time of admission, patient stated she had previously been discharged with home oxygen but did not receive it.  At the time of discharge 6/7 patient noted to have been stable off oxygen for 2 days.  Per chart patient 'does not appear to require O2 at rest at this time, largely nonambulatory'.  Patient not discharged on oxygen.   Per patient she has done so weak, despite the fact that she was drinking water but she kept feeling thirsty.  Today she was going down the hall in her wheelchair but was unable to keep her direction straight.  She kept going into the wall.  The more this occurred, the more upset she became in the more short of breath she became.  She was almost out of breath, she called 911.  She denies any fever, chills, nausea, vomiting or diarrhea.  Per patient she has been short of breath since discharge but significantly worse today.  She endorses mild wheeze since yesterday.  No cough.  She states her urine output has been normal.  She additionally complains of itching and stinging between her buttocks that started around 2 PM today.   On arrival patient hypoxic into the 80s, she was placed on a BiPAP.  Her initial blood pressure was decreased with systolic blood pressure in the 80s.  She received 1 L normal saline bolus. In the ER patient presenting blood pressure 100/54, HR 81, RR between 8-18, temperature afebrile.  Patient placed on 5 L oxygen, satting 100%. Creatinine 3.10, at discharge 1.77 [07/25/2022]".   07/31/2022: Check ABG.  Get echocardiogram.  Hypoxic  respiratory failure is improving.  Patient is morbidly obese.  Patient has history of OSA.  Patient maintains that she is compliant with CPAP machine.

## 2022-07-31 NOTE — ED Provider Notes (Addendum)
[place] EMERGENCY DEPARTMENT AT [facility] Provider Note   CSN: [id number] Arrival date & time: 07/30/22  2039     History  Chief Complaint  Patient presents with   Chest Pain    [name] is a 67 y.o. female.  HPI   67 year old female with multiple comorbidities presents emergency department after complaint of chest pain and shortness of breath.  Patient states this started about an hour ago.  She states that she has had the chest pain before, specifically when she was just recently admitted and discharged.  Patient was hypoxic on their arrival.  Placed on nasal cannula with improvement of chest pain and symptoms.  Hypotensive and route.  She states she is otherwise been compliant with her medications.  Denies any fever.  Home Medications Prior to Admission medications   Medication Sig Start Date End Date Taking? Authorizing Provider  acetaminophen (TYLENOL) 650 MG CR tablet Take 1,300 mg by mouth every 8 (eight) hours as needed for pain. Patient not taking: Reported on 07/22/2022    [provider]  albuterol (VENTOLIN HFA) 108 (90 Base) MCG/ACT inhaler Inhale 2 puffs into the lungs every 4 (four) hours as needed for wheezing or shortness of breath.     [provider]  aspirin EC 81 MG tablet Take 1 tablet (81 mg total) by mouth daily. 08/12/13   Viyuoh, Rolland Bimler, MD  Cholecalciferol (VITAMIN D-3) 1000 units CAPS Take 1,000 Units by mouth daily.    [provider]  Dulaglutide (TRULICITY) 4.5 MG/0.5ML SOPN Inject 4.5 mg into the skin once a week. 10/17/21   Carlus Pavlov, MD  esomeprazole (NEXIUM) 40 MG capsule Take 1 capsule (40 mg total) by mouth daily before breakfast. 10/30/14   Vassie Loll, MD  famotidine (PEPCID) 40 MG tablet Take 40 mg by mouth daily after supper. 06/16/20   [provider]  ferrous sulfate 325 (65 FE) MG tablet Take 325 mg by mouth in the morning.    [provider]  furosemide (LASIX) 40  MG tablet Take 2 tablets (80 mg total) by mouth daily. 07/25/22   Zannie Cove, MD  glucose blood [facility] VERIO) test strip TEST BLOOD SUGAR THREE TIMES DAILY 03/12/22   Carlus Pavlov, MD  Insulin NPH, Human,, Isophane, (HUMULIN N KWIKPEN) 100 UNIT/ML Kiwkpen Inject 25 Units into the skin every morning. And pen needles 1/day 07/25/22   Zannie Cove, MD  Lancets [facility] DELICA PLUS [place]) MISC 1 each by Other route 3 (three) times daily. Use to check blood sugar 3 times a day 05/23/22   Carlus Pavlov, MD  levothyroxine (SYNTHROID) 200 MCG tablet Take 1 tablet (200 mcg total) by mouth daily before breakfast. 04/25/22   Carlus Pavlov, MD  linaclotide (LINZESS) 290 MCG CAPS capsule Take 290 mcg by mouth daily before breakfast.    [provider]  LORazepam (ATIVAN) 1 MG tablet Take 0.5 tablets (0.5 mg total) by mouth 2 (two) times daily as needed for anxiety. 07/25/22   Zannie Cove, MD  mirabegron ER (MYRBETRIQ) 25 MG TB24 tablet Take 25 mg by mouth in the morning.    [provider]  naloxone [facility]) nasal spray 4 mg/0.1 mL Place 1 spray into the nose as needed for opioid reversal. 08/01/20   [provider]  polyvinyl alcohol (LIQUIFILM TEARS) 1.4 % ophthalmic solution 1 drop as needed for dry eyes.    [provider]  potassium chloride SA (KLOR-CON) 20 MEQ tablet Take 20 mEq  by mouth daily. 06/22/20   [provider]  pregabalin (LYRICA) 300 MG capsule Take 300 mg by mouth 2 (two) times daily.    [provider]  rosuvastatin (CRESTOR) 20 MG tablet Take 20 mg by mouth daily.    [provider]  SURE COMFORT PEN NEEDLES 32G X 6 MM MISC USE TO INJECT INSULIN FOUR TIMES DAILY 03/13/21   Romero Belling, MD  tolterodine (DETROL LA) 2 MG 24 hr capsule Take 2 mg by mouth every evening. 09/24/20   [provider]  valACYclovir (VALTREX) 1000 MG tablet Take 1,000 mg by mouth daily.    [provider]  venlafaxine XR  (EFFEXOR-XR) 150 MG 24 hr capsule Take 150 mg by mouth 2 (two) times daily. 05/21/15   [provider]  ziprasidone (GEODON) 60 MG capsule Take 60 mg by mouth daily after supper.    [provider]      Allergies    Bee venom, Invokana [canagliflozin], Shellfish allergy, Buprenorphine hcl, and Other    Review of Systems   Review of Systems  Constitutional:  Positive for fatigue. Negative for fever.  Respiratory:  Positive for shortness of breath.   Cardiovascular:  Positive for chest pain.  Gastrointestinal:  Negative for abdominal pain, diarrhea and vomiting.  Skin:  Negative for rash.  Neurological:  Negative for headaches.    Physical Exam Updated Vital Signs BP (!) 106/56   Pulse 77   Temp 97.8 F (36.6 C) (Oral)   Resp 10   Ht 5\' 5"  (1.651 m)   Wt (!) 159 kg   SpO2 98%   BMI 58.33 kg/m  Physical Exam Vitals and nursing note reviewed.  Constitutional:      Appearance: Normal appearance. She is obese. She is ill-appearing.  HENT:     Head: Normocephalic.     Mouth/Throat:     Mouth: Mucous membranes are moist.  Cardiovascular:     Rate and Rhythm: Normal rate.  Pulmonary:     Effort: Pulmonary effort is normal. No respiratory distress.     Breath sounds: Decreased breath sounds present.  Abdominal:     Palpations: Abdomen is soft.     Tenderness: There is no abdominal tenderness.  Musculoskeletal:     Right lower leg: Edema present.     Left lower leg: Edema present.  Skin:    General: Skin is warm.  Neurological:     Mental Status: She is alert and oriented to person, place, and time. Mental status is at baseline.  Psychiatric:        Mood and Affect: Mood normal.     ED Results / Procedures / Treatments   Labs (all labs ordered are listed, but only abnormal results are displayed) Labs Reviewed  BASIC METABOLIC PANEL - Abnormal; Notable for the following components:      Result Value   Sodium 134 (*)    Chloride 97 (*)    CO2 21 (*)     Glucose, Bld 138 (*)    BUN 59 (*)    Creatinine, Ser 2.83 (*)    GFR, Estimated 18 (*)    Anion gap 16 (*)    All other components within normal limits  CBC - Abnormal; Notable for the following components:   RBC 3.65 (*)    Hemoglobin 9.6 (*)    HCT 30.7 (*)    RDW 17.0 (*)    All other components within normal limits  I-STAT VENOUS BLOOD GAS,  ED - Abnormal; Notable for the following components:   pCO2, Ven 34.2 (*)    pO2, Ven 68 (*)    Acid-base deficit 3.0 (*)    Sodium 132 (*)    Calcium, Ion 1.00 (*)    HCT 30.0 (*)    Hemoglobin 10.2 (*)    All other components within normal limits  I-STAT CHEM 8, ED - Abnormal; Notable for the following components:   Sodium 132 (*)    BUN 57 (*)    Creatinine, Ser 3.10 (*)    Glucose, Bld 140 (*)    Calcium, Ion 1.02 (*)    TCO2 21 (*)    Hemoglobin 10.5 (*)    HCT 31.0 (*)    All other components within normal limits  HEPATIC FUNCTION PANEL  BRAIN NATRIURETIC PEPTIDE  TROPONIN I (HIGH SENSITIVITY)  TROPONIN I (HIGH SENSITIVITY)    EKG EKG Interpretation  Date/Time:  Wednesday July 30 2022 20:50:35 EDT Ventricular Rate:  82 PR Interval:  178 QRS Duration: 91 QT Interval:  388 QTC Calculation: 454 R Axis:   -37 Text Interpretation: Sinus rhythm Abnormal R-wave progression, early transition Inferior infarct, old Similar to previous Confirmed by Coralee Pesa [phone]) on 07/30/2022 9:15:21 PM  Radiology DG Chest Portable 1 View  Result Date: 07/30/2022 CLINICAL DATA:  Chest pain. EXAM: PORTABLE CHEST 1 VIEW COMPARISON:  Radiograph 07/24/2022 FINDINGS: Mild cardiomegaly. Stable mediastinal contours. Improved vascular congestion from prior. Left lung base atelectasis or scarring. No new airspace disease. No pneumothorax or pleural effusion. Surgical clips at the right thoracic inlet. IMPRESSION: Mild cardiomegaly. Improved vascular congestion from prior. Left lung base atelectasis or scarring. Electronically Signed   By:  Narda Rutherford M.D.   On: 07/30/2022 21:20    Procedures Ultrasound ED Peripheral IV (Provider)  Date/Time: 07/31/2022 12:29 AM  Performed by: Rozelle Logan, DO Authorized by: Rozelle Logan, DO   Procedure details:    Indications: hypotension, multiple failed IV attempts and poor IV access     Skin Prep: isopropyl alcohol     Location:  Left AC   Angiocath:  20 G   Bedside Ultrasound Guided: Yes     Images: not archived     Patient tolerated procedure without complications: No     Dressing applied: Yes   .Critical Care  Performed by: Rozelle Logan, DO Authorized by: Rozelle Logan, DO   Critical care provider statement:    Critical care time (minutes):  30   Critical care was necessary to treat or prevent imminent or life-threatening deterioration of the following conditions:  Circulatory failure and renal failure   Critical care was time spent personally by me on the following activities:  Development of treatment plan with patient or surrogate, discussions with consultants, evaluation of patient's response to treatment, examination of patient, ordering and review of laboratory studies, ordering and review of radiographic studies, ordering and performing treatments and interventions, pulse oximetry, re-evaluation of patient's condition and review of old charts   I assumed direction of critical care for this patient from another provider in my specialty: no     Care discussed with: admitting provider       Medications Ordered in ED Medications  sodium chloride 0.9 % bolus 500 mL (500 mLs Intravenous New Bag/Given 07/30/22 2155)  ipratropium-albuterol (DUONEB) 0.5-2.5 (3) MG/3ML nebulizer solution 3 mL (3 mLs Nebulization Given 07/30/22 2303)    ED Course/ Medical Decision Making/ A&P  Medical Decision Making Amount and/or Complexity of Data Reviewed Labs: ordered. Radiology: ordered.  Risk Prescription drug management. Decision  regarding hospitalization.   67 year old female presents emergency department with chest pain, shortness of breath.  Improved with supplemental oxygen.  Hypotensive on arrival, ill-appearing.  Review of recent discharge showed that she was supposed to be on supplemental oxygen at home, patient states "no one ever showed up to give it".  She is currently on 5 L nasal cannula, slightly short of breath but improved appearing.  Chest x-ray shows mild cardiomegaly but otherwise comparable to previous.  EKG is not acutely changed.  Blood work shows a baseline anemia and cardiac workup is negative.  However she has a worsening kidney dysfunction.  She admits to decreased p.o. intake.  After small dose of fluid blood pressure has improved.  She is resting comfortably but still appears short of breath, continues on the nasal cannula.  Will plan for admission due to hypoxia, worsening kidney injury.  Patients evaluation and results requires admission for further treatment and care.  Spoke with hospitalist, reviewed patient's ED course and they accept admission.  Patient agrees with admission plan, offers no new complaints and is stable/unchanged at time of admit.        Final Clinical Impression(s) / ED Diagnoses Final diagnoses:  Hypoxia  Injury of kidney, unspecified laterality, sequela    Rx / DC Orders ED Discharge Orders     None         Rozelle Logan, DO 07/31/22 0016    Rozelle Logan, DO 07/31/22 0030

## 2022-07-31 NOTE — ED Notes (Signed)
ED TO INPATIENT HANDOFF REPORT  ED Nurse Name and Phone #: Christean Leaf [phone]  S Name/Age/Gender [name] 67 y.o. female Room/Bed: 004C/004C  Code Status   Code Status: Full Code  Home/SNF/Other Home Patient oriented to: self, place, time, and situation Is this baseline? Yes   Triage Complete: Triage complete  Chief Complaint Acute kidney injury superimposed on chronic kidney disease (HCC) [N17.9, N18.9]  Triage Note Pt BIBGEMS from home after having sudden onset of chest pain that is a pressure approximately starting one hour ago. Pt is alert and oriented but fatigued during conversation reporting feeling more tired than usual. Clear and equal lung sounds bilaterally. Pain went from 9 to a 5 with oxygen  89/59 BP 90 palp 88 HR  Hx CHF and lymphedema  ASA 324  Oxygen 2 LPM [place] to 4 LPM [place] due to desaturation   Allergies Allergies  Allergen Reactions   Bee Venom Anaphylaxis   Invokana [Canagliflozin] Anaphylaxis    Dyspnea and urinary retention   Shellfish Allergy Anaphylaxis   Buprenorphine Hcl Other (See Comments)    Overly sedated with morphine drip.   Other Other (See Comments)    Cats - bad asthma, stopped up    Level of Care/Admitting Diagnosis ED Disposition     ED Disposition  Admit   Condition  --   Comment  Hospital Area: [facility] [[id number]]  Level of Care: Med-Surg [16]  May place patient in observation at [facility] or [facility] if equivalent level of care is available:: No  Covid Evaluation: Confirmed COVID Negative  Diagnosis: Acute kidney injury superimposed on chronic kidney disease [facility]) [[id number]]  Admitting Physician: Gery Pray [[id number]]  Attending Physician: Gery Pray [[id number]]          B Medical/Surgery History Past Medical History:  Diagnosis Date   Anemia    Anxiety and depression    Asthma    Breast cancer (HCC) 02/13/2012   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   partial   benign cyst  Left    removed from left breast   BREAST BIOPSY Right 02/13/2012   BREAST LUMPECTOMY  03/03/2012   Procedure: LUMPECTOMY;  Surgeon: Currie Paris, MD;  Location: [facility] OR;  Service: General;  Laterality: Right;   BREAST SURGERY Right 01/2012   "cancer"   CHOLECYSTECTOMY  1980's?   COLONOSCOPY WITH PROPOFOL N/A 09/28/2012   Procedure: COLONOSCOPY WITH PROPOFOL;  Surgeon: Shirley Friar, MD;  Location: [facility] ENDOSCOPY;  Service: Endoscopy;  Laterality: N/A;   FOOT FRACTURE SURGERY Right 1990's  JOINT REPLACEMENT      x 3   LESION EXCISION N/A 03/13/2021   Procedure: EXCISION SCALP SEBACEOUS CYSTS x2;  Surgeon: Axel Filler, MD;  Location: [facility] OR;  Service: General;  Laterality: N/A;  60 MINUTES LOCAL & MAC   LUMBAR LAMINECTOMY/DECOMPRESSION MICRODISCECTOMY N/A 12/05/2016   Procedure: LEFT L5-S1 MICRODISCECTOMY;  Surgeon: Eldred Manges, MD;  Location: [facility] OR;  Service: Orthopedics;  Laterality: N/A;   REVISION TOTAL KNEE ARTHROPLASTY  07/2009   SHOULDER OPEN ROTATOR CUFF REPAIR Left 1990's   THYROIDECTOMY  1970's   TOTAL KNEE ARTHROPLASTY Bilateral 2009-06/2009   left; right     A IV Location/Drains/Wounds Patient  Lines/Drains/Airways Status     Active Line/Drains/Airways     Name Placement date Placement time Site Days   Peripheral IV 07/30/22 20 G Left Antecubital 07/30/22  2135  Antecubital  1   External Urinary Catheter 07/25/22  1300  --  6   Wound / Incision (Open or Dehisced) 07/24/22 Irritant Dermatitis (Moisture Associated Skin Damage) Buttocks Right;Left moisture associaterd skin damage to bilat buttocks 07/24/22  --  Buttocks  7   Wound / Incision (Open or Dehisced) 07/24/22 Other (Comment) Toe (Comment  which one) Right black dry scab to right 4th toe 07/24/22  --  Toe (Comment  which one)  7            Intake/Output Last 24 hours No intake or output data in the 24 hours ending 07/31/22 0108  Labs/Imaging Results for orders placed or performed during the hospital encounter of 07/30/22 (from the past 48 hour(s))  Basic metabolic panel     Status: Abnormal   Collection Time: 07/30/22  9:17 PM  Result Value Ref Range   Sodium 134 (L) 135 - 145 mmol/L   Potassium 3.7 3.5 - 5.1 mmol/L   Chloride 97 (L) 98 - 111 mmol/L   CO2 21 (L) 22 - 32 mmol/L   Glucose, Bld 138 (H) 70 - 99 mg/dL    Comment: Glucose reference range applies only to samples taken after fasting for at least 8 hours.   BUN 59 (H) 8 - 23 mg/dL   Creatinine, Ser 1.61 (H) 0.44 - 1.00 mg/dL   Calcium 9.0 8.9 - 09.6 mg/dL   GFR, Estimated 18 (L) >60 mL/min    Comment: (NOTE) Calculated using the CKD-EPI Creatinine Equation (2021)    Anion gap 16 (H) 5 - 15    Comment: Performed at [facility], [address], [place], [place] [address]  CBC     Status: Abnormal   Collection Time: 07/30/22  9:17 PM  Result Value Ref Range   WBC 8.1 4.0 - 10.5 K/uL   RBC 3.65 (L) 3.87 - 5.11 MIL/uL   Hemoglobin 9.6 (L) 12.0 - 15.0 g/dL   HCT 98.1 (L) 19.1 - 47.8 %   MCV 84.1 80.0 - 100.0 fL   MCH 26.3 26.0 - 34.0 pg   MCHC 31.3 30.0 - 36.0 g/dL   RDW 29.5 (H) 62.1 - 30.8 %   Platelets 195 150 - 400 K/uL    Comment:  SPECIMEN CHECKED FOR CLOTS REPEATED TO VERIFY PLATELET COUNT CONFIRMED BY SMEAR    nRBC 0.0 0.0 - 0.2 %    Comment: Performed at [facility], [address], [place], [place] [address]  Troponin I (High Sensitivity)     Status: None   Collection Time: 07/30/22  9:17 PM  Result Value Ref Range  Troponin I (High Sensitivity) 5 <18 ng/L    Comment: (NOTE) Elevated high sensitivity troponin I (hsTnI) values and significant  changes across serial measurements may suggest ACS but many other  chronic and acute conditions are known to elevate hsTnI results.  Refer to the "Links" section for chest pain algorithms and additional  guidance. Performed at [facility], [address], [place], [place] [address]   I-Stat venous blood gas, [facility] ED, [facility], [facility])     Status: Abnormal   Collection Time: 07/30/22  9:22 PM  Result Value Ref Range   pH, Ven 7.402 7.25 - 7.43   pCO2, Ven 34.2 (L) 44 - 60 mmHg   pO2, Ven 68 (H) 32 - 45 mmHg   Bicarbonate 21.3 20.0 - 28.0 mmol/L   TCO2 22 22 - 32 mmol/L   O2 Saturation 94 %   Acid-base deficit 3.0 (H) 0.0 - 2.0 mmol/L   Sodium 132 (L) 135 - 145 mmol/L   Potassium 3.7 3.5 - 5.1 mmol/L   Calcium, Ion 1.00 (L) 1.15 - 1.40 mmol/L   HCT 30.0 (L) 36.0 - 46.0 %   Hemoglobin 10.2 (L) 12.0 - 15.0 g/dL   Sample type VENOUS   I-stat chem 8, ED (not at [facility], [facility] or [facility])     Status: Abnormal   Collection Time: 07/30/22  9:22 PM  Result Value Ref Range   Sodium 132 (L) 135 - 145 mmol/L   Potassium 3.7 3.5 - 5.1 mmol/L   Chloride 102 98 - 111 mmol/L   BUN 57 (H) 8 - 23 mg/dL   Creatinine, Ser 6.04 (H) 0.44 - 1.00 mg/dL   Glucose, Bld 540 (H) 70 - 99 mg/dL    Comment: Glucose reference range applies only to samples taken after fasting for at least 8 hours.   Calcium, Ion 1.02 (L) 1.15 - 1.40 mmol/L   TCO2 21 (L) 22 - 32 mmol/L   Hemoglobin 10.5 (L) 12.0 - 15.0 g/dL   HCT 98.1 (L) 19.1 - 47.8 %  Hepatic function panel     Status: None   Collection  Time: 07/30/22  9:40 PM  Result Value Ref Range   Total Protein 6.6 6.5 - 8.1 g/dL   Albumin 3.6 3.5 - 5.0 g/dL   AST 19 15 - 41 U/L   ALT 22 0 - 44 U/L   Alkaline Phosphatase 71 38 - 126 U/L   Total Bilirubin 0.3 0.3 - 1.2 mg/dL   Bilirubin, Direct <2.9 0.0 - 0.2 mg/dL   Indirect Bilirubin NOT CALCULATED 0.3 - 0.9 mg/dL    Comment: Performed at [facility], [address] [place], [place] [address]  Brain natriuretic peptide     Status: None   Collection Time: 07/30/22  9:40 PM  Result Value Ref Range   B Natriuretic Peptide 8.3 0.0 - 100.0 pg/mL    Comment: Performed at [facility], [address], [place], [place] [address]  Troponin I (High Sensitivity)     Status: None   Collection Time: 07/30/22 10:52 PM  Result Value Ref Range   Troponin I (High Sensitivity) 4 <18 ng/L    Comment: (NOTE) Elevated high sensitivity troponin I (hsTnI) values and significant  changes across serial measurements may suggest ACS but many other  chronic and acute conditions are known to elevate hsTnI results.  Refer to the "Links" section for chest pain algorithms and additional  guidance. Performed at [facility], [address] [place], [place] [address]   CBG monitoring, ED     Status: Abnormal   Collection Time: 07/31/22 12:54 AM  Result Value Ref Range   Glucose-Capillary 135 (H) 70 - 99 mg/dL    Comment: Glucose reference range applies only to samples taken after fasting for at least 8 hours.   *Note: Due to a large number of results and/or encounters for the requested time period, some results have not been displayed. A complete set of results can be found in Results Review.   DG Chest Portable 1 View  Result Date: 07/30/2022 CLINICAL DATA:  Chest pain. EXAM: PORTABLE CHEST 1 VIEW COMPARISON:  Radiograph 07/24/2022 FINDINGS: Mild cardiomegaly. Stable mediastinal contours. Improved vascular congestion from prior. Left lung base atelectasis or scarring. No new  airspace disease. No pneumothorax or pleural effusion. Surgical clips at the right thoracic inlet. IMPRESSION: Mild cardiomegaly. Improved vascular congestion from prior. Left lung base atelectasis or scarring. Electronically Signed   By: Narda Rutherford M.D.   On: 07/30/2022 21:20    Pending Labs Unresulted Labs (From admission, onward)     Start     Ordered   07/31/22 0500  Basic metabolic panel  Tomorrow morning,   R        07/31/22 0024   07/31/22 0500  CBC  Tomorrow morning,   R        07/31/22 0024   07/31/22 0024  Creatinine, serum  (heparin)  Once,   R       Comments: Baseline for heparin therapy IF NOT ALREADY DRAWN.    07/31/22 0024            Vitals/Pain Today's Vitals   07/30/22 2230 07/30/22 2245 07/31/22 0030 07/31/22 0106  BP: (!) 104/52 (!) 106/56 (!) 110/58   Pulse: 81 77 82   Resp: (!) 9 10 15    Temp:    97.9 F (36.6 C)  TempSrc:    Oral  SpO2: 100% 98% 100%   Weight:      Height:      PainSc:        Isolation Precautions No active isolations  Medications Medications  lactated ringers infusion ( Intravenous New Bag/Given 07/31/22 0106)  insulin aspart (novoLOG) injection 0-9 Units (has no administration in time range)  insulin aspart (novoLOG) injection 0-5 Units ( Subcutaneous Not Given 07/31/22 0055)  heparin injection 5,000 Units (has no administration in time range)  acetaminophen (TYLENOL) tablet 650 mg (has no administration in time range)    Or  acetaminophen (TYLENOL) suppository 650 mg (has no administration in time range)  ondansetron (ZOFRAN) tablet 4 mg (has no administration in time range)    Or  ondansetron (ZOFRAN) injection 4 mg (has no administration in time range)  senna-docusate (Senokot-S) tablet 1 tablet (has no administration in time range)  sodium chloride 0.9 % bolus 500 mL (0 mLs Intravenous Stopped 07/31/22 0100)  ipratropium-albuterol (DUONEB) 0.5-2.5 (3) MG/3ML nebulizer solution 3 mL (3 mLs Nebulization Given 07/30/22  2303)  hydrOXYzine (ATARAX) tablet 25 mg (25 mg Oral Given 07/31/22 0106)    Mobility walks with device     Focused Assessments    R Recommendations: See Admitting Provider Note  Report given to:   Additional Notes: Pt is pleasant patient, here for chest pain, was found to be hypotensive.

## 2022-08-01 ENCOUNTER — Inpatient Hospital Stay (HOSPITAL_COMMUNITY): Payer: 59

## 2022-08-01 DIAGNOSIS — N189 Chronic kidney disease, unspecified: Secondary | ICD-10-CM | POA: Diagnosis not present

## 2022-08-01 DIAGNOSIS — I272 Pulmonary hypertension, unspecified: Secondary | ICD-10-CM | POA: Diagnosis not present

## 2022-08-01 DIAGNOSIS — N179 Acute kidney failure, unspecified: Secondary | ICD-10-CM | POA: Diagnosis not present

## 2022-08-01 LAB — ECHOCARDIOGRAM COMPLETE
AR max vel: 1.44 cm2
AV Area VTI: 1.34 cm2
AV Area mean vel: 1.47 cm2
AV Mean grad: 17.2 mmHg
AV Peak grad: 30.6 mmHg
Ao pk vel: 2.77 m/s
Area-P 1/2: 3.68 cm2
Calc EF: 75 %
Height: 65 in
S' Lateral: 3.5 cm
Single Plane A2C EF: 74.1 %
Single Plane A4C EF: 76.4 %
Weight: 5509.74 oz

## 2022-08-01 LAB — GLUCOSE, CAPILLARY
Glucose-Capillary: 80 mg/dL (ref 70–99)
Glucose-Capillary: 93 mg/dL (ref 70–99)
Glucose-Capillary: 126 mg/dL — ABNORMAL HIGH (ref 70–99)
Glucose-Capillary: 127 mg/dL — ABNORMAL HIGH (ref 70–99)

## 2022-08-01 LAB — RENAL FUNCTION PANEL
Albumin: 3.1 g/dL — ABNORMAL LOW (ref 3.5–5.0)
Anion gap: 11 (ref 5–15)
BUN: 41 mg/dL — ABNORMAL HIGH (ref 8–23)
CO2: 27 mmol/L (ref 22–32)
Calcium: 8.7 mg/dL — ABNORMAL LOW (ref 8.9–10.3)
Chloride: 100 mmol/L (ref 98–111)
Creatinine, Ser: 1.83 mg/dL — ABNORMAL HIGH (ref 0.44–1.00)
GFR, Estimated: 30 mL/min — ABNORMAL LOW (ref >60)
Glucose, Bld: 108 mg/dL — ABNORMAL HIGH (ref 70–99)
Phosphorus: 3.7 mg/dL (ref 2.5–4.6)
Potassium: 3.7 mmol/L (ref 3.5–5.1)
Sodium: 138 mmol/L (ref 135–145)

## 2022-08-01 LAB — MAGNESIUM: Magnesium: 2.2 mg/dL (ref 1.7–2.4)

## 2022-08-01 MED ORDER — HEPARIN SODIUM (PORCINE) 5000 UNIT/ML IJ SOLN
5000.0000 [IU] | Freq: Three times a day (TID) | INTRAMUSCULAR | Status: DC
  Administered 2022-08-02 (×2): 5000 [IU] via SUBCUTANEOUS
  Filled 2022-08-01 (×2): qty 1

## 2022-08-01 NOTE — Progress Notes (Signed)
PROGRESS NOTE    [name]  ZOX:[id number] DOB: June 09, 1955 DOA: 07/30/2022 PCP: Jamey Reas, PA-C  Outpatient Specialists:     Brief Narrative:  Patient is a 67 year old female, morbidly obese, with past medical history significant for chronic pain, chronic kidney disease stage IIIb, hypothyroidism, diastolic CHF, hypertension, OSA on CPAP.  Patient was admitted with hypoxic respiratory failure.  On presentation, patient was requiring significant amount of supplemental oxygen.  08/01/2022: Patient has continued to improve.  Oxygen requirement is being weaned down.  Patient will be checked for home oxygen prior to discharge.  No other constitutional symptoms endorsed today.   Assessment & Plan:   Principal Problem:   Acute kidney injury superimposed on chronic kidney disease (HCC) Active Problems:   Hypothyroidism   HLD (hyperlipidemia)   Depression   Mild persistent asthma   Breast cancer of upper-outer quadrant of right female breast (HCC)   Diabetic polyneuropathy (HCC)   Diabetes mellitus, type II, insulin dependent (HCC)   Disorganized schizophrenia (HCC)   Chronic pain syndrome   Memory difficulty   History of malignant neoplasm of breast   Chronic respiratory failure with hypoxia (HCC)   Chronic diastolic CHF (congestive heart failure) (HCC)   Acute-on-chronic kidney injury (HCC)   Acute on chronic respiratory failure (HCC)   Acute on chronic kidney injury stage IIIb: -Slowly improving towards baseline. -Continue to monitor closely. -Follow-up with nephrology on discharge. -Avoid nephrotoxins. -Keep MAP greater than 65 mmHg.      Acute respiratory failure with hypoxia (HCC): -Suspect patient was not compliant with treatment of OSA at home. -On presentation, patient was on 5 L/min of supplemental oxygen. -Patient has been able to wean down significant amount of supplemental oxygen. -Optimize OSA treatment. -Diet and exercise.  Encouraged weight  loss. -Assess need for home oxygen prior to discharge.   Itchy inner buttock -Seems to have resolved significantly. -As needed Vistaril and moisturizer ordered    Chronic diastolic CHF (congestive heart failure) (HCC): -Compensated. -Follow-up with cardiology on discharge.   OSA/OHS -Needs to comply with treatment of OSA. -Continue CPAP during the hospital stay.      Chronic pain syndrome -Continue Belbuca, Lyrica, cyclobenzaprine (qhs)/methocarbamol.    Diabetes mellitus type 2/diabetic polyneuropathy (HCC) -Sliding scale insulin resumed -NPH continued -Trulicity on hold    Disorganized schizophrenia (HCC) -No active symptoms. -Follow-up with psychiatry on discharge.   HLD (hyperlipidemia) -Crestor continued    Hypothyroidism -Synthroid continued   Mild persistent asthma FEV1 50% in 2018.   -PRN bronchodilators   Remote H/o breast cancer (HCC)   DVT prophylaxis: Subcutaneous heparin Code Status: Full code Family Communication:  Disposition Plan: Likely discharge tomorrow   Consultants:  None  Procedures:  None  Antimicrobials:  None   Subjective: Shortness of breath has improved significantly.  Objective: Vitals:   08/01/22 0450 08/01/22 0854 08/01/22 1145 08/01/22 1609  BP: 105/60 115/62  (!) 109/56  Pulse: 81 83 81 85  Resp: 16 16  16   Temp: 98.5 F (36.9 C) 98.5 F (36.9 C)  98.4 F (36.9 C)  TempSrc: Oral Oral  Oral  SpO2: 98% 98% 95% 96%  Weight:      Height:        Intake/Output Summary (Last 24 hours) at 08/01/2022 1822 Last data filed at 08/01/2022 1200 Gross per 24 hour  Intake 720 ml  Output 2000 ml  Net -1280 ml   Filed Weights   07/30/22 2050 07/31/22 0226 07/31/22 1818  Weight: [name])  159 kg (!) 155.6 kg (!) 156.2 kg    Examination:  General exam: Appears calm and comfortable.  Patient is morbidly obese. Respiratory system: Clear to auscultation.  Cardiovascular system: S1 & S2 heard Gastrointestinal system: Morbidly  obese, soft and nontender.   Central nervous system: Awake and alert.  Patient moves all extremities. Extremities: Chronic bilateral lower extremity edema.  Data Reviewed: I have personally reviewed following labs and imaging studies  CBC: Recent Labs  Lab 07/30/22 2117 07/30/22 2122 07/31/22 0853  WBC 8.1  --  6.8  HGB 9.6* 10.5*  10.2* 9.4*  HCT 30.7* 31.0*  30.0* 29.3*  MCV 84.1  --  84.0  PLT 195  --  182   Basic Metabolic Panel: Recent Labs  Lab 07/30/22 2117 07/30/22 2122 07/31/22 0853 08/01/22 0234  NA 134* 132*  132* 134* 138  K 3.7 3.7  3.7 3.2* 3.7  CL 97* 102 98 100  CO2 21*  --  24 27  GLUCOSE 138* 140* 167* 108*  BUN 59* 57* 50* 41*  CREATININE 2.83* 3.10* 2.15* 1.83*  CALCIUM 9.0  --  8.3* 8.7*  MG  --   --   --  2.2  PHOS  --   --   --  3.7   GFR: Estimated Creatinine Clearance: 46.2 mL/min (A) (by C-G formula based on SCr of 1.83 mg/dL (H)). Liver Function Tests: Recent Labs  Lab 07/30/22 2140 08/01/22 0234  AST 19  --   ALT 22  --   ALKPHOS 71  --   BILITOT 0.3  --   PROT 6.6  --   ALBUMIN 3.6 3.1*   No results for input(s): "LIPASE", "AMYLASE" in the last 168 hours. No results for input(s): "AMMONIA" in the last 168 hours. Coagulation Profile: No results for input(s): "INR", "PROTIME" in the last 168 hours. Cardiac Enzymes: No results for input(s): "CKTOTAL", "CKMB", "CKMBINDEX", "TROPONINI" in the last 168 hours. BNP (last 3 results) No results for input(s): "PROBNP" in the last 8760 hours. HbA1C: No results for input(s): "HGBA1C" in the last 72 hours. CBG: Recent Labs  Lab 07/31/22 1546 07/31/22 2115 08/01/22 0722 08/01/22 1146 08/01/22 1624  GLUCAP 91 117* 80 93 126*   Lipid Profile: No results for input(s): "CHOL", "HDL", "LDLCALC", "TRIG", "CHOLHDL", "LDLDIRECT" in the last 72 hours. Thyroid Function Tests: No results for input(s): "TSH", "T4TOTAL", "FREET4", "T3FREE", "THYROIDAB" in the last 72 hours. Anemia  Panel: No results for input(s): "VITAMINB12", "FOLATE", "FERRITIN", "TIBC", "IRON", "RETICCTPCT" in the last 72 hours. Urine analysis:    Component Value Date/Time   COLORURINE COLORLESS (A) 07/22/2022 2031   APPEARANCEUR CLEAR 07/22/2022 2031   APPEARANCEUR Clear 11/13/2013 1310   LABSPEC 1.005 07/22/2022 2031   LABSPEC 1.017 11/13/2013 1310   PHURINE 5.0 07/22/2022 2031   GLUCOSEU NEGATIVE 07/22/2022 2031   GLUCOSEU Negative 11/13/2013 1310   GLUCOSEU NEG mg/dL 16/11/9602 5409   HGBUR NEGATIVE 07/22/2022 2031   BILIRUBINUR NEGATIVE 07/22/2022 2031   BILIRUBINUR Negative 11/13/2013 1310   KETONESUR NEGATIVE 07/22/2022 2031   PROTEINUR NEGATIVE 07/22/2022 2031   UROBILINOGEN 0.2 11/12/2014 2057   NITRITE NEGATIVE 07/22/2022 2031   LEUKOCYTESUR NEGATIVE 07/22/2022 2031   LEUKOCYTESUR Trace 11/13/2013 1310   Sepsis Labs: @LABRCNTIP (procalcitonin:4,lacticidven:4)  ) Recent Results (from the past 240 hour(s))  MRSA Next Gen by PCR, Nasal     Status: Abnormal   Collection Time: 07/31/22  2:37 AM   Specimen: Nasal Mucosa; Nasal Swab  Result Value Ref Range  Status   MRSA by PCR Next Gen DETECTED (A) NOT DETECTED Final    Comment: RESULTS CALLED TO, READ BACK BY AND VERIFIED WITH RN E.DOLAN ON 07/31/22 AT 0403 BY NM (NOTE) The GeneXpert MRSA Assay (FDA approved for NASAL specimens only), is one component of a comprehensive MRSA colonization surveillance program. It is not intended to diagnose MRSA infection nor to guide or monitor treatment for MRSA infections. Test performance is not FDA approved in patients less than 11 years old. Performed at [facility], [address] [place], [place] [address]          Radiology Studies: ECHOCARDIOGRAM COMPLETE  Result Date: 08/01/2022    ECHOCARDIOGRAM REPORT   Patient Name:   RAYN WOODINGTON Date of Exam: 08/01/2022 Medical Rec #:  [id number]        Height:       65.0 in Accession #:    [id number]       Weight:       344.4  lb Date of Birth:  10/31/1955        BSA:          2.492 m Patient Age:    67 years         BP:           115/62 mmHg Patient Gender: F                HR:           89 bpm. Exam Location:  Inpatient Procedure: 2D Echo, Cardiac Doppler and Color Doppler Indications:    I27.20 Pulmonary Hypertension  History:        Patient has prior history of Echocardiogram examinations, most                 recent 09/02/2021. CHF, Aortic Valve Disease,                 Signs/Symptoms:Dyspnea, Shortness of Breath, Altered Mental                 Status and Syncope; Risk Factors:Hypertension, Dyslipidemia and                 Diabetes. Breast cancer. Aortic stenosis.  Sonographer:    Sheralyn Boatman RDCS Referring Phys: [id number] Evangelina Delancey I Kwynn Schlotter IMPRESSIONS  1. Left ventricular ejection fraction, by estimation, is 65 to 70%. The left ventricle has normal function. The left ventricle has no regional wall motion abnormalities. Left ventricular diastolic parameters were normal.  2. Right ventricular systolic function is normal. The right ventricular size is normal.  3. The mitral valve is normal in structure. No evidence of mitral valve regurgitation. No evidence of mitral stenosis.  4. The aortic valve was not well visualized. There is moderate calcification of the aortic valve. Aortic valve regurgitation is not visualized. Mild to moderate aortic valve stenosis.  5. The inferior vena cava is normal in size with greater than 50% respiratory variability, suggesting right atrial pressure of 3 mmHg. FINDINGS  Left Ventricle: Left ventricular ejection fraction, by estimation, is 65 to 70%. The left ventricle has normal function. The left ventricle has no regional wall motion abnormalities. The left ventricular internal cavity size was normal in size. There is  no left ventricular hypertrophy. Left ventricular diastolic parameters were normal. Right Ventricle: The right ventricular size is normal. No increase in right ventricular wall thickness. Right  ventricular systolic function is normal. Left Atrium: Left atrial size was normal in size.  Right Atrium: Right atrial size was normal in size. Pericardium: There is no evidence of pericardial effusion. Mitral Valve: The mitral valve is normal in structure. No evidence of mitral valve regurgitation. No evidence of mitral valve stenosis. Tricuspid Valve: The tricuspid valve is normal in structure. Tricuspid valve regurgitation is not demonstrated. No evidence of tricuspid stenosis. Aortic Valve: The aortic valve was not well visualized. There is moderate calcification of the aortic valve. Aortic valve regurgitation is not visualized. Mild to moderate aortic stenosis is present. Aortic valve mean gradient measures 17.2 mmHg. Aortic valve peak gradient measures 30.6 mmHg. Aortic valve area, by VTI measures 1.34 cm. Pulmonic Valve: The pulmonic valve was normal in structure. Pulmonic valve regurgitation is not visualized. No evidence of pulmonic stenosis. Aorta: The aortic root is normal in size and structure. Venous: The inferior vena cava is normal in size with greater than 50% respiratory variability, suggesting right atrial pressure of 3 mmHg. IAS/Shunts: The interatrial septum appears to be lipomatous. The interatrial septum was not well visualized.  LEFT VENTRICLE PLAX 2D LVIDd:         5.00 cm     Diastology LVIDs:         3.50 cm     LV e' medial:    7.83 cm/s LV PW:         1.10 cm     LV E/e' medial:  15.8 LV IVS:        1.00 cm     LV e' lateral:   10.60 cm/s LVOT diam:     2.00 cm     LV E/e' lateral: 11.7 LV SV:         72 LV SV Index:   29 LVOT Area:     3.14 cm  LV Volumes (MOD) LV vol d, MOD A2C: 92.7 ml LV vol d, MOD A4C: 72.5 ml LV vol s, MOD A2C: 24.0 ml LV vol s, MOD A4C: 17.1 ml LV SV MOD A2C:     68.7 ml LV SV MOD A4C:     72.5 ml LV SV MOD BP:      62.7 ml RIGHT VENTRICLE RV S prime:     18.10 cm/s TAPSE (M-mode): 2.0 cm LEFT ATRIUM             Index        RIGHT ATRIUM           Index LA diam:         3.60 cm 1.44 cm/m   RA Area:     20.10 cm LA Vol (A2C):   51.4 ml 20.63 ml/m  RA Volume:   55.00 ml  22.07 ml/m LA Vol (A4C):   37.3 ml 14.97 ml/m LA Biplane Vol: 44.2 ml 17.74 ml/m  AORTIC VALVE AV Area (Vmax):    1.44 cm AV Area (Vmean):   1.47 cm AV Area (VTI):     1.34 cm AV Vmax:           276.60 cm/s AV Vmean:          192.400 cm/s AV VTI:            0.540 m AV Peak Grad:      30.6 mmHg AV Mean Grad:      17.2 mmHg LVOT Vmax:         127.00 cm/s LVOT Vmean:        90.300 cm/s LVOT VTI:  0.230 m LVOT/AV VTI ratio: 0.43  AORTA Ao Root diam: 3.30 cm Ao Asc diam:  3.50 cm MITRAL VALVE MV Area (PHT): 3.68 cm     SHUNTS MV Decel Time: 206 msec     Systemic VTI:  0.23 m MV E velocity: 124.00 cm/s  Systemic Diam: 2.00 cm MV A velocity: 122.50 cm/s MV E/A ratio:  1.01 Charlton Haws MD Electronically signed by Charlton Haws MD Signature Date/Time: 08/01/2022/3:07:16 PM    Final    CT CHEST WO CONTRAST  Result Date: 07/31/2022 CLINICAL DATA:  Hypoxia EXAM: CT CHEST WITHOUT CONTRAST TECHNIQUE: Multidetector CT imaging of the chest was performed following the standard protocol without IV contrast. RADIATION DOSE REDUCTION: This exam was performed according to the departmental dose-optimization program which includes automated exposure control, adjustment of the mA and/or kV according to patient size and/or use of iterative reconstruction technique. COMPARISON:  Chest CT dated August 31, 2021 FINDINGS: Cardiovascular: Normal heart size. No pericardial effusion. Normal caliber thoracic aorta with mild calcified plaque. Severe coronary artery calcifications. Mitral annular calcifications. Dilated main pulmonary artery, measuring up to 3.5 cm. Mediastinum/Nodes: Small hiatal hernia. Patulous esophagus with layering debris. Prior right thyroidectomy. No enlarged lymph nodes seen in the chest. Lungs/Pleura: Central airways are patent. Mild cylindrical bronchiectasis, most pronounced in the right lower lobe.  Bibasilar atelectasis. No consolidation, pleural effusion or pneumothorax. Upper Abdomen: No acute abnormality. Musculoskeletal: Surgical clips of the right breast. No aggressive appearing osseous lesions. IMPRESSION: 1. No acute findings in the chest. 2. Mild cylindrical bronchiectasis, most pronounced in the right lower lobe, finding can be seen in the setting of prior infection or recurrent aspiration. 3. Patulous esophagus with layering debris, suggestive of esophageal dysmotility. 4. Dilated main pulmonary artery, which can be seen in the setting of pulmonary hypertension. 5. Severe coronary artery calcifications. 6. Mild aortic atherosclerosis (ICD10-I70.0). Electronically Signed   By: Allegra Lai M.D.   On: 07/31/2022 08:53   DG Chest Portable 1 View  Result Date: 07/30/2022 CLINICAL DATA:  Chest pain. EXAM: PORTABLE CHEST 1 VIEW COMPARISON:  Radiograph 07/24/2022 FINDINGS: Mild cardiomegaly. Stable mediastinal contours. Improved vascular congestion from prior. Left lung base atelectasis or scarring. No new airspace disease. No pneumothorax or pleural effusion. Surgical clips at the right thoracic inlet. IMPRESSION: Mild cardiomegaly. Improved vascular congestion from prior. Left lung base atelectasis or scarring. Electronically Signed   By: Narda Rutherford M.D.   On: 07/30/2022 21:20        Scheduled Meds:  amLODipine  10 mg Oral Daily   aspirin EC  81 mg Oral Daily   Buprenorphine HCl  750 mcg Oral BID   Chlorhexidine Gluconate Cloth  6 each Topical Q0600   heparin  5,000 Units Subcutaneous Q8H   insulin aspart  0-5 Units Subcutaneous QHS   insulin aspart  0-9 Units Subcutaneous TID WC   insulin NPH Human  25 Units Subcutaneous Q breakfast   levothyroxine  200 mcg Oral Q0600   mirabegron ER  25 mg Oral Daily   mupirocin ointment  1 Application Nasal BID   pantoprazole  40 mg Oral Daily   pregabalin  300 mg Oral BID   rosuvastatin  20 mg Oral Daily   valACYclovir  1,000 mg Oral  Daily   venlafaxine XR  150 mg Oral BID   ziprasidone  60 mg Oral QPC supper   Continuous Infusions:   LOS: 1 day    Time spent: 35 minutes.    Berton Mount,  MD  [facility] Pager #: [phone] 7PM-7AM contact night coverage as above

## 2022-08-01 NOTE — Progress Notes (Signed)
SATURATION QUALIFICATIONS:   Patient Saturations on Room Air at Rest = 98%  Patient Saturations on [employer] while Transferring (pt is non-ambulatory, utilizes a power wheelchair) = 94%  Patient maintains oxygen saturation 94% and above throughout session. Pt also reports no significant DOE during session despite increased work of breathing when mobilizing.  Arlyss Gandy, PT, DPT Acute Rehabilitation Office [phone]

## 2022-08-01 NOTE — Progress Notes (Signed)
  Echocardiogram 2D Echocardiogram has been performed.  Janalyn Harder 08/01/2022, 2:16 PM

## 2022-08-01 NOTE — Progress Notes (Signed)
   08/01/22 0045  BiPAP/CPAP/SIPAP  Reason BIPAP/CPAP not in use Other(comment);Non-compliant (Refused, pt does not like to use a full faced mask, prefers her nasal pillows at home)

## 2022-08-01 NOTE — TOC Progression Note (Signed)
Transition of Care [facility]) - Initial/Assessment Note    Patient Details  Name: [name] MRN: [id number] Date of Birth: 1955/10/14  [facility] of [facility]) CM/SW Contact:    Ralene Bathe, LCSW Phone Number: 08/01/2022, 1:36 PM  Clinical Narrative:                 LCSW spoke with patient's Adult Protective Services (APS) social worker, Ferrel Logan.  The APS worker informed LCSW that the patient has declined all services offered by APS including SNF and [facility].  Per APS worker, the patient is alert and oriented, can still make decisions for herself, and only wants to be at her home.    TOC following.   Patient Goals and CMS Choice            Expected Discharge Plan and Services                                              Prior Living Arrangements/Services                       Activities of Daily Living Home Assistive Devices/Equipment: Wheelchair ADL Screening (condition at time of admission) Patient's cognitive ability adequate to safely complete daily activities?: No Is the patient deaf or have difficulty hearing?: No Does the patient have difficulty seeing, even when wearing glasses/contacts?: No Does the patient have difficulty concentrating, remembering, or making decisions?: No Patient able to express need for assistance with ADLs?: Yes Does the patient have difficulty dressing or bathing?: Yes Independently performs ADLs?: No Communication: Independent Is this a change from baseline?: Pre-admission baseline Dressing (OT): Needs assistance Is this a change from baseline?: Pre-admission baseline Grooming: Needs assistance Is this a change from baseline?: Pre-admission baseline Feeding: Needs assistance Is this a change from baseline?: Pre-admission baseline Bathing: Needs assistance Is this a change from baseline?: Pre-admission baseline Toileting: Needs assistance Is this a change from baseline?: Pre-admission  baseline In/Out Bed: Needs assistance Is this a change from baseline?: Pre-admission baseline Walks in Home: Independent with device (comment) (Wheelchair at home) Is this a change from baseline?: Pre-admission baseline Does the patient have difficulty walking or climbing stairs?: Yes Weakness of Legs: Both Weakness of Arms/Hands: Both  Permission Sought/Granted                  Emotional Assessment              Admission diagnosis:  Hypoxia [R09.02] Injury of kidney, unspecified laterality, sequela [S37.009S] Acute kidney injury superimposed on chronic kidney disease (HCC) [N17.9, N18.9] Acute on chronic respiratory failure (HCC) [J96.20] Patient Active Problem List   Diagnosis Date Noted   Acute-on-chronic kidney injury (HCC) 07/31/2022   Acute kidney injury superimposed on chronic kidney disease (HCC) 07/31/2022   Acute on chronic respiratory failure (HCC) 07/31/2022   Chronic diastolic CHF (congestive heart failure) (HCC) 07/22/2022   Hypoxia 07/22/2022   Obesity hypoventilation syndrome (HCC) 07/22/2022   Hypokalemia 07/22/2022   Deficiency of parathyrin (HCC) 01/27/2022   CHF exacerbation (HCC) 09/02/2021   Generalized weakness 08/31/2021   Ambulatory dysfunction 08/31/2021   Chronic respiratory failure with hypoxia (HCC) 08/31/2021   AMS (altered mental status) 09/29/2020   Hyperparathyroidism, secondary (HCC) 03/23/2018   Memory difficulty 12/22/2017   Spinal stenosis 06/09/2017   HNP (herniated nucleus pulposus),  lumbar 12/05/2016   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   Diabetic neuropathy (HCC) 09/08/2016   Hypocalcemia 08/22/2016   Abnormal barium swallow 07/07/2016   Anxiety 07/07/2016   History of malignant neoplasm of breast 07/07/2016   Gait abnormality 05/29/2016   Encephalopathy acute 04/23/2016   Hyponatremia 04/23/2016   Schizophrenia, paranoid (HCC)    Aspiration pneumonia of left lower  lobe due to gastric secretions (HCC)    Projectile vomiting with nausea    Altered mental status    Schizophrenia (HCC)    Disorganized schizophrenia (HCC)    Chronic pain syndrome    Acute renal failure (HCC)    Acute encephalopathy    Oropharyngeal dysphagia    Aspiration pneumonia of right lower lobe due to vomit (HCC)    Acute renal failure (ARF) (HCC) 03/28/2016   Encephalopathy 03/27/2016   HCAP (healthcare-associated pneumonia) 02/29/2016   Pneumonia 02/14/2016   CAP (community acquired pneumonia) 02/13/2016   Edema 02/13/2016   AKI (acute kidney injury) (HCC) 02/13/2016   Elevated troponin 06/24/2015   Diarrhea 06/23/2015   Sepsis (HCC) 06/23/2015   Obesity, morbid, BMI 50 or higher (HCC) 03/26/2015   Post traumatic stress disorder (PTSD) 11/14/2014   Leukocytosis 11/12/2014   Slurred speech 11/12/2014   Stage 3b chronic kidney disease (CKD) (HCC)    Diabetes mellitus, type II, insulin dependent (HCC)    TIA (transient ischemic attack)    Hypotension 10/28/2014   Syncope 10/28/2014   Anemia due to chronic kidney disease 10/28/2014   Type II diabetes mellitus (HCC) 10/28/2014   OSA on CPAP 10/28/2014   Chronic lower back pain 10/28/2014   Polyneuropathy in diabetes(357.2) 10/28/2014   Diabetic polyneuropathy (HCC)    Fall 08/14/2014   Recurrent falls 08/14/2014   Hot flashes 02/24/2014   Arthralgia 02/24/2014   Hair loss 02/24/2014   Chest pain 01/06/2014   Shock (HCC) 08/08/2013   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   Dysarthria 12/30/2012   Benign paroxysmal positional vertigo 12/30/2012   Breast cancer of upper-outer quadrant of right female breast (HCC) 11/18/2012   Hemorrhage of rectum and anus 09/28/2012   Hx of radiation therapy    Syncope and collapse 06/04/2011   HSV 10/05/2008   Mild persistent asthma 06/02/2008   Dyspnea 07/19/2007   HLD (hyperlipidemia) 06/02/2006   Sinus tachycardia 05/25/2006   Hypothyroidism 02/27/2006   Depression  02/27/2006   Essential hypertension 02/27/2006   GERD 02/27/2006   KNEE PAIN 02/27/2006   PCP:  Jamey Reas, PA-C Pharmacy:   [facility] - [place], [place] - [address] [place] [address] Phone: [phone] Fax: [phone]     Social Determinants of Health (SDOH) Social History: SDOH Screenings   Food Insecurity: No Food Insecurity (07/31/2022)  Housing: Low Risk  (07/31/2022)  Transportation Needs: No Transportation Needs (07/31/2022)  Utilities: Not At Risk (07/31/2022)  Tobacco Use: Medium Risk (07/30/2022)   SDOH Interventions:     Readmission Risk Interventions    07/25/2022    2:28 PM  Readmission Risk Prevention Plan  Transportation Screening Complete  Medication Review (RN Care Manager) Referral to Pharmacy  PCP or Specialist appointment within 3-5 days of discharge Complete  HRI or Home Care Consult Complete  SW Recovery Care/Counseling Consult Complete  Palliative Care Screening Not Applicable  Skilled Nursing Facility Patient Refused

## 2022-08-01 NOTE — Progress Notes (Signed)
   08/01/22 2011  BiPAP/CPAP/SIPAP  Reason BIPAP/CPAP not in use Non-compliant (Pt refusing to wear hospital mask, prefers the nasal pillows she uses with her home unit)

## 2022-08-01 NOTE — Evaluation (Signed)
Physical Therapy Evaluation Patient Details Name: [name] MRN: [id number] DOB: 24-Jun-1955 Today's Date: 08/01/2022  History of Present Illness  67 y.o. female presents to [name] hospital on 07/30/2022 with weakness and DOE. Pt found to be hypoxic in ED. PMH includes morbid obesity, chronic pain, CKDIII, hypothyroidism, diastolic CHF, HTN, OSA on CPAP.  Clinical Impression  Pt presents to PT with deficits in strength, power, functional mobility, balance, endurance. Pt requires assistance to transfers from low bed to low recliner seat this session. At home the pt sleeps in a much higher hospital bed and her power wheelchair also sits higher. Transferring from bed to wheelchair at home would likely be much easier than simulated transfer during this session due to increased knee extension when initiating transfer from a higher surface. Pt does report difficulty managing transfers from her power wheelchair to taller bed, often falling when attempting. PT provides suggestions for assistance from her nephew or aides to transfer back to bed for safety purposes. Pt reports she does own a hoyer lift at this time. Pt refuses potential recommendation of short term inpatient PT placement due to falls risk. Pt will benefit from HHPT services along with assistance from caregivers when returning to bed.       Recommendations for follow up therapy are one component of a multi-disciplinary discharge planning process, led by the attending physician.  Recommendations may be updated based on patient status, additional functional criteria and insurance authorization.  Follow Up Recommendations       Assistance Recommended at Discharge Intermittent Supervision/Assistance  Patient can return home with the following  A little help with walking and/or transfers;A little help with bathing/dressing/bathroom;Assistance with cooking/housework;Assist for transportation;Help with stairs or ramp for entrance    Equipment  Recommendations Other (comment) (pt requesting a new hospital bed that is lower than her current one, case manager addressed this with patient earlier in the day)  Recommendations for Other Services       Functional Status Assessment Patient has had a recent decline in their functional status and demonstrates the ability to make significant improvements in function in a reasonable and predictable amount of time.     Precautions / Restrictions Precautions Precautions: Fall Precaution Comments: pt reports sleeping in the floor often because she falls attempting to get into her tall bed Restrictions Weight Bearing Restrictions: No      Mobility  Bed Mobility Overal bed mobility: Needs Assistance Bed Mobility: Supine to Sit, Sit to Supine     Supine to sit: Modified independent (Device/Increase time) Sit to supine: Mod assist   General bed mobility comments: increased time supine to sit, assist required for LE management when returning to supine    Transfers Overall transfer level: Needs assistance Equipment used: 1 person hand held assist Transfers: Bed to chair/wheelchair/BSC       Squat pivot transfers: Mod assist, Min assist     General transfer comment: modA from bed to wheelchair, minA from wheelchair to bed. Bed and wheelchair both much lower than bed and power wheelchair at home, making transfers more difficult especailly with pt's history of knee OA and surgeries.    Ambulation/Gait                  Stairs            Wheelchair Mobility    Modified Rankin (Stroke Patients Only)       Balance Overall balance assessment: Needs assistance Sitting-balance support: No upper extremity supported, Feet supported  Sitting balance-Leahy Scale: Good                                       Pertinent Vitals/Pain Pain Assessment Pain Assessment: Faces Faces Pain Scale: Hurts even more Pain Location: knee Pain Descriptors / Indicators:  Aching Pain Intervention(s): Monitored during session    Home Living Family/patient expects to be discharged to:: Private residence Living Arrangements: Alone Available Help at Discharge: Personal care attendant (has 2 PCAs that come by daily between the 2. Nephew also assists PRN) Type of Home: House Home Access: Ramped entrance       Home Layout: One level Home Equipment: Grab bars - toilet;Toilet riser;Shower seat;Wheelchair - power      Prior Function Prior Level of Function : Needs assist             Mobility Comments: pt performs lateral scoot or squat pivot transfers between surfaces at home, frequent falls ADLs Comments: Needs assist with bathing/dressing     Hand Dominance   Dominant Hand: Right    Extremity/Trunk Assessment   Upper Extremity Assessment Upper Extremity Assessment: Overall WFL for tasks assessed    Lower Extremity Assessment Lower Extremity Assessment: Generalized weakness (bilateral knee OA, history of 2 failed R TKA)    Cervical / Trunk Assessment Cervical / Trunk Assessment: Other exceptions Cervical / Trunk Exceptions: morbid obesity  Communication   Communication: No difficulties  Cognition Arousal/Alertness: Awake/alert Behavior During Therapy: WFL for tasks assessed/performed Overall Cognitive Status: Within Functional Limits for tasks assessed                                          General Comments General comments (skin integrity, edema, etc.): VSS on RA    Exercises     Assessment/Plan    PT Assessment Patient needs continued PT services  PT Problem List Decreased strength;Decreased activity tolerance;Decreased balance;Decreased mobility;Decreased knowledge of use of DME;Decreased safety awareness;Decreased knowledge of precautions       PT Treatment Interventions Functional mobility training;Balance training;Neuromuscular re-education;Patient/family education    PT Goals (Current goals can be  found in the Care Plan section)  Acute Rehab PT Goals Patient Stated Goal: to return home PT Goal Formulation: With patient Time For Goal Achievement: 08/15/22 Potential to Achieve Goals: Fair    Frequency Min 3X/week     Co-evaluation               AM-PAC PT "6 Clicks" Mobility  Outcome Measure Help needed turning from your back to your side while in a flat bed without using bedrails?: A Little Help needed moving from lying on your back to sitting on the side of a flat bed without using bedrails?: A Little Help needed moving to and from a bed to a chair (including a wheelchair)?: A Little Help needed standing up from a chair using your arms (e.g., wheelchair or bedside chair)?: Total Help needed to walk in hospital room?: Total Help needed climbing 3-5 steps with a railing? : Total 6 Click Score: 12    End of Session Equipment Utilized During Treatment: Gait belt Activity Tolerance: Patient tolerated treatment well Patient left: in bed;with call bell/phone within reach;with bed alarm set;with family/visitor present Nurse Communication: Mobility status PT Visit Diagnosis: Other abnormalities of gait and mobility (R26.89);Muscle weakness (generalized) (  M62.81);History of falling (Z91.81)    Time: 4098-1191 PT Time Calculation (min) (ACUTE ONLY): 37 min   Charges:   PT Evaluation $PT Eval Low Complexity: 1 Low          Arlyss Gandy, PT, DPT Acute Rehabilitation Office [phone]   Arlyss Gandy 08/01/2022, 4:45 PM

## 2022-08-01 NOTE — Progress Notes (Signed)
OT Cancellation Note  Patient Details Name: [name] MRN: [id number] DOB: 12-10-1955   Cancelled Treatment:    Reason Eval/Treat Not Completed: OT screened, no needs identified, will sign off.  Spoke with primary PT, advised patient close to baseline with plans to return home with prior supports.    [name] [name] 08/01/2022, 3:57 PM 08/01/2022  RP, OTR/L  Acute Rehabilitation Services  Office:  [phone]

## 2022-08-02 LAB — RENAL FUNCTION PANEL
Albumin: 2.9 g/dL — ABNORMAL LOW (ref 3.5–5.0)
Anion gap: 11 (ref 5–15)
BUN: 35 mg/dL — ABNORMAL HIGH (ref 8–23)
CO2: 28 mmol/L (ref 22–32)
Calcium: 8.8 mg/dL — ABNORMAL LOW (ref 8.9–10.3)
Chloride: 99 mmol/L (ref 98–111)
Creatinine, Ser: 1.65 mg/dL — ABNORMAL HIGH (ref 0.44–1.00)
GFR, Estimated: 34 mL/min — ABNORMAL LOW (ref >60)
Glucose, Bld: 117 mg/dL — ABNORMAL HIGH (ref 70–99)
Phosphorus: 2.8 mg/dL (ref 2.5–4.6)
Potassium: 3.7 mmol/L (ref 3.5–5.1)
Sodium: 138 mmol/L (ref 135–145)

## 2022-08-02 LAB — GLUCOSE, CAPILLARY
Glucose-Capillary: 89 mg/dL (ref 70–99)
Glucose-Capillary: 100 mg/dL — ABNORMAL HIGH (ref 70–99)

## 2022-08-02 MED ORDER — SENNOSIDES-DOCUSATE SODIUM 8.6-50 MG PO TABS: 1.0000 | ORAL_TABLET | Freq: Every evening | ORAL | 0 refills | Status: AC | PRN

## 2022-08-02 MED ORDER — AMLODIPINE BESYLATE 2.5 MG PO TABS: 2.5000 mg | ORAL_TABLET | Freq: Every day | ORAL | 0 refills | Status: DC

## 2022-08-02 MED ORDER — MUPIROCIN 2 % EX OINT: 1.0000 | TOPICAL_OINTMENT | Freq: Two times a day (BID) | CUTANEOUS | 0 refills | Status: DC

## 2022-08-02 MED ORDER — POLYETHYLENE GLYCOL 3350 17 G PO PACK: 17.0000 g | PACK | Freq: Every day | ORAL | 0 refills | Status: DC | PRN

## 2022-08-02 NOTE — TOC Transition Note (Signed)
Transition of Care [facility]) - CM/SW Discharge Note   Patient Details  Name: [name] MRN: [id number] Date of Birth: August 13, 1955  Transition of Care [facility] CM/SW Contact:  Tom-Johnson, Hershal Coria, RN Phone Number: 08/02/2022, 2:29 PM   Clinical Narrative:     Patient is scheduled for discharge today.  Readmission Risk Assessment done. Home health info, hospital f/u and discharge instructions on AVS. PTAR scheduled to transport at discharge.  No further TOC needs noted.   Final next level of care: Home w Home Health Services Barriers to Discharge: Barriers Resolved   Patient Goals and CMS Choice CMS Medicare.gov Compare Post Acute Care list provided to:: Patient Choice offered to / list presented to : Patient  Discharge Placement                  Patient to be transferred to facility by: PTAR      Discharge Plan and Services Additional resources added to the After Visit Summary for     Discharge Planning Services: CM Consult Post Acute Care Choice: Home Health                    HH Arranged: PT, OT, RN (Resumption of care) [facility] Agency: [facility] Date [facility] Agency Contacted: 07/31/22 Time HH Agency Contacted: 1030 Representative spoke with at [facility] Agency: Elnita Maxwell  Social Determinants of Health (SDOH) Interventions SDOH Screenings   Food Insecurity: No Food Insecurity (07/31/2022)  Housing: Low Risk  (07/31/2022)  Transportation Needs: No Transportation Needs (07/31/2022)  Utilities: Not At Risk (07/31/2022)  Tobacco Use: Medium Risk (07/30/2022)     Readmission Risk Interventions    07/25/2022    2:28 PM  Readmission Risk Prevention Plan  Transportation Screening Complete  Medication Review (RN Care Manager) Referral to Pharmacy  PCP or Specialist appointment within 3-5 days of discharge Complete  HRI or Home Care Consult Complete  SW Recovery Care/Counseling Consult Complete  Palliative Care Screening Not Applicable  Skilled  Nursing Facility Patient Refused

## 2022-08-02 NOTE — Progress Notes (Signed)
Levon Hedger to be D/C'd home per MD order. Discussed with the patient and all questions fully answered.  Skin clean, dry and intact without evidence of skin break down, no evidence of skin tears noted.  IV catheter discontinued intact. Site without signs and symptoms of complications. Dressing and pressure applied.  An After Visit Summary was printed and given to the patient.  Patient escorted via stretcher, and D/C home via PTAR.  Jon Gills  08/02/2022

## 2022-08-02 NOTE — Discharge Summary (Signed)
Physician Discharge Summary  Patient ID: [name] MRN: [id number] DOB/AGE: 10/24/1955 67 y.o.  Admit date: 07/30/2022 Discharge date: 08/02/2022  Admission Diagnoses:  Discharge Diagnoses:  Principal Problem:   Acute kidney injury superimposed on chronic kidney disease (HCC) Active Problems:   Hypothyroidism   HLD (hyperlipidemia)   Depression   Mild persistent asthma   Breast cancer of upper-outer quadrant of right female breast (HCC)   Diabetic polyneuropathy (HCC)   Diabetes mellitus, type II, insulin dependent (HCC)   Disorganized schizophrenia (HCC)   Chronic pain syndrome   Memory difficulty   History of malignant neoplasm of breast   Chronic respiratory failure with hypoxia (HCC)   Chronic diastolic CHF (congestive heart failure) (HCC)   Acute-on-chronic kidney injury (HCC)   Acute on chronic respiratory failure (HCC)   Discharged Condition: stable  Hospital Course: Patient is a 67 year old female, morbidly obese, with past medical history significant for chronic pain, chronic kidney disease stage IIIb, hypothyroidism, diastolic CHF, hypertension, OSA on CPAP.  Apparently, patient was recently discharged from the hospital on 07/25/2022 with hypoxia that was related to OSA/OHS/heart failure with preserved ejection fraction.  Patient was re admitted with hypoxic respiratory failure.  On presentation, patient was requiring significant amount of supplemental oxygen.  With treatment of OSA, continued to significantly and oxygen requirement decreased significantly as well.  Patient will follow-up with primary care provider on discharge.  Patient should also follow-up with pulmonary team on discharge.   Acute on chronic kidney injury stage IIIb: -Slowly improving towards baseline. -Continue to monitor closely. -Follow-up with nephrology on discharge. -Avoid nephrotoxins. -Continue to keep MAP greater than 65 mmHg.      Acute respiratory failure with hypoxia  (HCC): -Suspect patient was not compliant with treatment of OSA at home. -On presentation, patient was on 5 L/min of supplemental oxygen. -Oxygen requirement has decreased significantly.   -Optimize OSA treatment. -Diet and exercise.  Encouraged weight loss. -Assess need for home oxygen prior to discharge.   Itchy inner buttock -Seems to have resolved significantly. -As needed Vistaril and moisturizer ordered    Chronic diastolic CHF (congestive heart failure) (HCC): -Compensated. -Follow-up with cardiology on discharge.   OSA/OHS -Needs to comply with treatment of OSA. -Continue CPAP during the hospital stay.      Chronic pain syndrome -Continue Belbuca, Lyrica, cyclobenzaprine (qhs)/methocarbamol.    Diabetes mellitus type 2/diabetic polyneuropathy (HCC) -Sliding scale insulin resumed -NPH continued -Trulicity on hold    Disorganized schizophrenia (HCC) -No active symptoms. -Follow-up with psychiatry on discharge.    HLD (hyperlipidemia) -Crestor continued    Hypothyroidism -Synthroid continued   Mild persistent asthma FEV1 50% in 2018.   -PRN bronchodilators   Remote H/o breast cancer (HCC)      Consults: None  Discharge Exam: Blood pressure 116/63, pulse 79, temperature 97.8 F (36.6 C), temperature source Oral, resp. rate 20, height 5\' 5"  (1.651 m), weight (!) 156.2 kg, SpO2 96 %.   Disposition: Discharge disposition: 01-Home or Self Care       Discharge Instructions     Diet - low sodium heart healthy   Complete by: As directed    Discharge wound care:   Complete by: As directed    Continue current care plan   Increase activity slowly   Complete by: As directed       Allergies as of 08/02/2022       Reactions   Bee Venom Anaphylaxis   Invokana [canagliflozin] Anaphylaxis   Dyspnea and urinary  retention   Shellfish Allergy Anaphylaxis   Buprenorphine Hcl Other (See Comments)   Overly sedated with morphine drip.   Other Other (See  Comments)   Cats - bad asthma, stopped up        Medication List     STOP taking these medications    LORazepam 1 MG tablet Commonly known as: ATIVAN   tolterodine 2 MG 24 hr capsule Commonly known as: DETROL LA   Trulicity 4.5 MG/0.5ML Sopn Generic drug: Dulaglutide       TAKE these medications    albuterol 108 (90 Base) MCG/ACT inhaler Commonly known as: VENTOLIN HFA Inhale 2 puffs into the lungs every 4 (four) hours as needed for wheezing or shortness of breath.   amLODipine 2.5 MG tablet Commonly known as: NORVASC Take 1 tablet (2.5 mg total) by mouth daily. What changed:  medication strength how much to take   aspirin EC 81 MG tablet Take 1 tablet (81 mg total) by mouth daily.   Belbuca 750 MCG Film Generic drug: Buprenorphine HCl Take 750 mcg by mouth 2 (two) times daily.   cyclobenzaprine 5 MG tablet Commonly known as: FLEXERIL Take 5 mg by mouth 3 (three) times daily as needed for muscle spasms.   esomeprazole 40 MG capsule Commonly known as: NEXIUM Take 1 capsule (40 mg total) by mouth daily before breakfast.   famotidine 40 MG tablet Commonly known as: PEPCID Take 40 mg by mouth daily after supper.   ferrous sulfate 325 (65 FE) MG tablet Take 325 mg by mouth in the morning.   furosemide 40 MG tablet Commonly known as: LASIX Take 2 tablets (80 mg total) by mouth daily.   HumuLIN N KwikPen 100 UNIT/ML Kiwkpen Generic drug: Insulin NPH (Human) (Isophane) Inject 25 Units into the skin every morning. And pen needles 1/day   levothyroxine 200 MCG tablet Commonly known as: SYNTHROID Take 1 tablet (200 mcg total) by mouth daily before breakfast.   linaclotide 290 MCG Caps capsule Commonly known as: LINZESS Take 290 mcg by mouth daily before breakfast.   mirabegron ER 25 MG Tb24 tablet Commonly known as: MYRBETRIQ Take 25 mg by mouth in the morning.   mupirocin ointment 2 % Commonly known as: BACTROBAN Place 1 Application into the nose  2 (two) times daily.   naloxone 4 MG/0.1ML Liqd nasal spray kit Commonly known as: NARCAN Place 1 spray into the nose as needed for opioid reversal.   OneTouch Delica Plus Lancet33G Misc 1 each by Other route 3 (three) times daily. Use to check blood sugar 3 times a day   OneTouch Verio test strip Generic drug: glucose blood TEST BLOOD SUGAR THREE TIMES DAILY   polyethylene glycol 17 g packet Commonly known as: MIRALAX / GLYCOLAX Take 17 g by mouth daily as needed for mild constipation.   polyvinyl alcohol 1.4 % ophthalmic solution Commonly known as: LIQUIFILM TEARS Place 1 drop into both eyes as needed for dry eyes.   potassium chloride SA 20 MEQ tablet Commonly known as: KLOR-CON M Take 20 mEq by mouth daily.   pregabalin 300 MG capsule Commonly known as: LYRICA Take 300 mg by mouth 2 (two) times daily.   rosuvastatin 20 MG tablet Commonly known as: CRESTOR Take 20 mg by mouth daily.   senna-docusate 8.6-50 MG tablet Commonly known as: Senokot-S Take 1 tablet by mouth at bedtime as needed for up to 14 days for mild constipation.   Sure Comfort Pen Needles 32G X 6 MM Misc  Generic drug: Insulin Pen Needle USE TO INJECT INSULIN FOUR TIMES DAILY   valACYclovir 1000 MG tablet Commonly known as: VALTREX Take 1,000 mg by mouth daily.   venlafaxine XR 150 MG 24 hr capsule Commonly known as: EFFEXOR-XR Take 150 mg by mouth 2 (two) times daily.   Vitamin D-3 25 MCG (1000 UT) Caps Take 1,000 Units by mouth daily.   ziprasidone 60 MG capsule Commonly known as: GEODON Take 60 mg by mouth daily after supper.               Durable Medical Equipment  (From admission, onward)           Start     Ordered   08/01/22 1703  For home use only DME oxygen  Once       Question Answer Comment  Length of Need 6 Months   Liters per Minute 2   Oxygen delivery system Gas      08/01/22 1703              Discharge Care Instructions  (From admission, onward)            Start     Ordered   08/02/22 0000  Discharge wound care:       Comments: Continue current care plan   08/02/22 1327            Follow-up Information     [facility] Follow up.   Why: Some one will call you to schedule resumption of care visit. Contact information: [address] [place] [address]  [phone]               Time spent: 35 minutes.  SignedBarnetta Chapel 08/02/2022, 1:28 PM

## 2022-08-06 NOTE — ED Provider Notes (Signed)
This is an addendum to note from 07/22/22.  CRITICAL CARE Performed by: Jacalyn Lefevre   Total critical care time: 30 minutes  Critical care time was exclusive of separately billable procedures and treating other patients.  Critical care was necessary to treat or prevent imminent or life-threatening deterioration.  Critical care was time spent personally by me on the following activities: development of treatment plan with patient and/or surrogate as well as nursing, discussions with consultants, evaluation of patient's response to treatment, examination of patient, obtaining history from patient or surrogate, ordering and performing treatments and interventions, ordering and review of laboratory studies, ordering and review of radiographic studies, pulse oximetry and re-evaluation of patient's condition.    Jacalyn Lefevre, MD 08/06/22 [phone]

## 2022-08-13 ENCOUNTER — Other Ambulatory Visit: Payer: Self-pay | Admitting: Endocrinology

## 2022-08-13 DIAGNOSIS — N2581 Secondary hyperparathyroidism of renal origin: Secondary | ICD-10-CM

## 2022-08-27 ENCOUNTER — Ambulatory Visit: Payer: 59 | Admitting: Internal Medicine

## 2022-09-02 ENCOUNTER — Telehealth: Payer: Self-pay

## 2022-09-02 DIAGNOSIS — E1122 Type 2 diabetes mellitus with diabetic chronic kidney disease: Secondary | ICD-10-CM

## 2022-09-02 DIAGNOSIS — E890 Postprocedural hypothyroidism: Secondary | ICD-10-CM

## 2022-09-02 MED ORDER — GNP ULTICARE PEN NEEDLES 32G X 4 MM MISC
1.0000 | Freq: Four times a day (QID) | 1 refills | Status: DC
Start: 2022-09-02 — End: 2022-09-25

## 2022-09-02 MED ORDER — CALCITRIOL 0.25 MCG PO CAPS
0.2500 ug | ORAL_CAPSULE | ORAL | 1 refills | Status: DC
Start: 2022-09-03 — End: 2022-11-04

## 2022-09-02 NOTE — Telephone Encounter (Signed)
 [name], CMA  ?

## 2022-09-25 ENCOUNTER — Other Ambulatory Visit: Payer: Self-pay | Admitting: Internal Medicine

## 2022-09-25 DIAGNOSIS — E1122 Type 2 diabetes mellitus with diabetic chronic kidney disease: Secondary | ICD-10-CM

## 2022-09-27 ENCOUNTER — Emergency Department (HOSPITAL_COMMUNITY): Payer: 59

## 2022-09-27 ENCOUNTER — Other Ambulatory Visit: Payer: Self-pay

## 2022-09-27 ENCOUNTER — Inpatient Hospital Stay (HOSPITAL_COMMUNITY)
Admission: EM | Admit: 2022-09-27 | Discharge: 2022-09-30 | DRG: 917 | Disposition: A | Discharge status: Home Health | Payer: 59 | Attending: Internal Medicine | Admitting: Internal Medicine

## 2022-09-27 DIAGNOSIS — Z87891 Personal history of nicotine dependence: Secondary | ICD-10-CM

## 2022-09-27 DIAGNOSIS — Z9109 Other allergy status, other than to drugs and biological substances: Secondary | ICD-10-CM

## 2022-09-27 DIAGNOSIS — Z9989 Dependence on other enabling machines and devices: Secondary | ICD-10-CM

## 2022-09-27 DIAGNOSIS — Z87892 Personal history of anaphylaxis: Secondary | ICD-10-CM

## 2022-09-27 DIAGNOSIS — N1419 Nephropathy induced by other drugs, medicaments and biological substances: Secondary | ICD-10-CM | POA: Diagnosis present

## 2022-09-27 DIAGNOSIS — F200 Paranoid schizophrenia: Secondary | ICD-10-CM | POA: Diagnosis present

## 2022-09-27 DIAGNOSIS — E1142 Type 2 diabetes mellitus with diabetic polyneuropathy: Secondary | ICD-10-CM | POA: Diagnosis present

## 2022-09-27 DIAGNOSIS — R5383 Other fatigue: Secondary | ICD-10-CM | POA: Diagnosis not present

## 2022-09-27 DIAGNOSIS — K219 Gastro-esophageal reflux disease without esophagitis: Secondary | ICD-10-CM | POA: Diagnosis present

## 2022-09-27 DIAGNOSIS — J9601 Acute respiratory failure with hypoxia: Secondary | ICD-10-CM | POA: Diagnosis present

## 2022-09-27 DIAGNOSIS — Z79899 Other long term (current) drug therapy: Secondary | ICD-10-CM

## 2022-09-27 DIAGNOSIS — Z8049 Family history of malignant neoplasm of other genital organs: Secondary | ICD-10-CM

## 2022-09-27 DIAGNOSIS — W050XXA Fall from non-moving wheelchair, initial encounter: Secondary | ICD-10-CM | POA: Diagnosis present

## 2022-09-27 DIAGNOSIS — Z923 Personal history of irradiation: Secondary | ICD-10-CM

## 2022-09-27 DIAGNOSIS — Z885 Allergy status to narcotic agent status: Secondary | ICD-10-CM

## 2022-09-27 DIAGNOSIS — Z79890 Hormone replacement therapy (postmenopausal): Secondary | ICD-10-CM

## 2022-09-27 DIAGNOSIS — G894 Chronic pain syndrome: Secondary | ICD-10-CM | POA: Diagnosis present

## 2022-09-27 DIAGNOSIS — Z6841 Body Mass Index (BMI) 40.0 and over, adult: Secondary | ICD-10-CM

## 2022-09-27 DIAGNOSIS — T50901A Poisoning by unspecified drugs, medicaments and biological substances, accidental (unintentional), initial encounter: Secondary | ICD-10-CM | POA: Diagnosis present

## 2022-09-27 DIAGNOSIS — T40601A Poisoning by unspecified narcotics, accidental (unintentional), initial encounter: Secondary | ICD-10-CM | POA: Diagnosis not present

## 2022-09-27 DIAGNOSIS — Z5329 Procedure and treatment not carried out because of patient's decision for other reasons: Secondary | ICD-10-CM | POA: Diagnosis present

## 2022-09-27 DIAGNOSIS — E662 Morbid (severe) obesity with alveolar hypoventilation: Secondary | ICD-10-CM | POA: Diagnosis present

## 2022-09-27 DIAGNOSIS — F32A Depression, unspecified: Secondary | ICD-10-CM | POA: Diagnosis present

## 2022-09-27 DIAGNOSIS — F419 Anxiety disorder, unspecified: Secondary | ICD-10-CM

## 2022-09-27 DIAGNOSIS — Z7401 Bed confinement status: Secondary | ICD-10-CM

## 2022-09-27 DIAGNOSIS — T502X5A Adverse effect of carbonic-anhydrase inhibitors, benzothiadiazides and other diuretics, initial encounter: Secondary | ICD-10-CM | POA: Diagnosis present

## 2022-09-27 DIAGNOSIS — I959 Hypotension, unspecified: Secondary | ICD-10-CM | POA: Diagnosis present

## 2022-09-27 DIAGNOSIS — G219 Secondary parkinsonism, unspecified: Secondary | ICD-10-CM | POA: Diagnosis present

## 2022-09-27 DIAGNOSIS — N179 Acute kidney failure, unspecified: Secondary | ICD-10-CM | POA: Diagnosis present

## 2022-09-27 DIAGNOSIS — E872 Acidosis, unspecified: Secondary | ICD-10-CM | POA: Diagnosis present

## 2022-09-27 DIAGNOSIS — Z888 Allergy status to other drugs, medicaments and biological substances status: Secondary | ICD-10-CM

## 2022-09-27 DIAGNOSIS — Z91030 Bee allergy status: Secondary | ICD-10-CM

## 2022-09-27 DIAGNOSIS — I5032 Chronic diastolic (congestive) heart failure: Secondary | ICD-10-CM | POA: Diagnosis present

## 2022-09-27 DIAGNOSIS — Z7982 Long term (current) use of aspirin: Secondary | ICD-10-CM

## 2022-09-27 DIAGNOSIS — Z853 Personal history of malignant neoplasm of breast: Secondary | ICD-10-CM

## 2022-09-27 DIAGNOSIS — Z8249 Family history of ischemic heart disease and other diseases of the circulatory system: Secondary | ICD-10-CM

## 2022-09-27 DIAGNOSIS — Z803 Family history of malignant neoplasm of breast: Secondary | ICD-10-CM

## 2022-09-27 DIAGNOSIS — E1122 Type 2 diabetes mellitus with diabetic chronic kidney disease: Secondary | ICD-10-CM | POA: Diagnosis present

## 2022-09-27 DIAGNOSIS — Z993 Dependence on wheelchair: Secondary | ICD-10-CM

## 2022-09-27 DIAGNOSIS — E785 Hyperlipidemia, unspecified: Secondary | ICD-10-CM | POA: Diagnosis present

## 2022-09-27 DIAGNOSIS — E860 Dehydration: Secondary | ICD-10-CM | POA: Diagnosis present

## 2022-09-27 DIAGNOSIS — Z794 Long term (current) use of insulin: Secondary | ICD-10-CM

## 2022-09-27 DIAGNOSIS — E890 Postprocedural hypothyroidism: Secondary | ICD-10-CM | POA: Diagnosis present

## 2022-09-27 DIAGNOSIS — J9620 Acute and chronic respiratory failure, unspecified whether with hypoxia or hypercapnia: Secondary | ICD-10-CM | POA: Diagnosis present

## 2022-09-27 DIAGNOSIS — Z9049 Acquired absence of other specified parts of digestive tract: Secondary | ICD-10-CM

## 2022-09-27 DIAGNOSIS — Z825 Family history of asthma and other chronic lower respiratory diseases: Secondary | ICD-10-CM

## 2022-09-27 DIAGNOSIS — Z91013 Allergy to seafood: Secondary | ICD-10-CM

## 2022-09-27 DIAGNOSIS — N1832 Chronic kidney disease, stage 3b: Secondary | ICD-10-CM | POA: Diagnosis present

## 2022-09-27 DIAGNOSIS — N178 Other acute kidney failure: Secondary | ICD-10-CM | POA: Diagnosis present

## 2022-09-27 DIAGNOSIS — E039 Hypothyroidism, unspecified: Secondary | ICD-10-CM | POA: Diagnosis present

## 2022-09-27 DIAGNOSIS — Z8349 Family history of other endocrine, nutritional and metabolic diseases: Secondary | ICD-10-CM

## 2022-09-27 DIAGNOSIS — Z833 Family history of diabetes mellitus: Secondary | ICD-10-CM

## 2022-09-27 DIAGNOSIS — G4733 Obstructive sleep apnea (adult) (pediatric): Secondary | ICD-10-CM

## 2022-09-27 DIAGNOSIS — R052 Subacute cough: Secondary | ICD-10-CM | POA: Diagnosis present

## 2022-09-27 DIAGNOSIS — Z8711 Personal history of peptic ulcer disease: Secondary | ICD-10-CM

## 2022-09-27 DIAGNOSIS — I130 Hypertensive heart and chronic kidney disease with heart failure and stage 1 through stage 4 chronic kidney disease, or unspecified chronic kidney disease: Secondary | ICD-10-CM | POA: Diagnosis present

## 2022-09-27 DIAGNOSIS — E119 Type 2 diabetes mellitus without complications: Secondary | ICD-10-CM

## 2022-09-27 DIAGNOSIS — N2581 Secondary hyperparathyroidism of renal origin: Secondary | ICD-10-CM | POA: Diagnosis present

## 2022-09-27 DIAGNOSIS — E875 Hyperkalemia: Secondary | ICD-10-CM | POA: Diagnosis present

## 2022-09-27 DIAGNOSIS — J45909 Unspecified asthma, uncomplicated: Secondary | ICD-10-CM | POA: Diagnosis present

## 2022-09-27 DIAGNOSIS — Z96653 Presence of artificial knee joint, bilateral: Secondary | ICD-10-CM | POA: Diagnosis present

## 2022-09-27 DIAGNOSIS — T464X5A Adverse effect of angiotensin-converting-enzyme inhibitors, initial encounter: Secondary | ICD-10-CM | POA: Diagnosis present

## 2022-09-27 LAB — COMPREHENSIVE METABOLIC PANEL
ALT: 23 U/L (ref 0–44)
AST: 15 U/L (ref 15–41)
Albumin: 3.9 g/dL (ref 3.5–5.0)
Alkaline Phosphatase: 62 U/L (ref 38–126)
Anion gap: 19 — ABNORMAL HIGH (ref 5–15)
BUN: 126 mg/dL — ABNORMAL HIGH (ref 8–23)
CO2: 17 mmol/L — ABNORMAL LOW (ref 22–32)
Calcium: 9.2 mg/dL (ref 8.9–10.3)
Chloride: 91 mmol/L — ABNORMAL LOW (ref 98–111)
Creatinine, Ser: 4.84 mg/dL — ABNORMAL HIGH (ref 0.44–1.00)
GFR, Estimated: 9 mL/min — ABNORMAL LOW (ref >60)
Glucose, Bld: 117 mg/dL — ABNORMAL HIGH (ref 70–99)
Potassium: 5.7 mmol/L — ABNORMAL HIGH (ref 3.5–5.1)
Sodium: 127 mmol/L — ABNORMAL LOW (ref 135–145)
Total Bilirubin: 0.4 mg/dL (ref 0.3–1.2)
Total Protein: 6.6 g/dL (ref 6.5–8.1)

## 2022-09-27 LAB — CBC WITH DIFFERENTIAL/PLATELET
Abs Immature Granulocytes: 0.05 10*3/uL (ref 0.00–0.07)
Basophils Absolute: 0 10*3/uL (ref 0.0–0.1)
Basophils Relative: 0 %
Eosinophils Absolute: 0.2 10*3/uL (ref 0.0–0.5)
Eosinophils Relative: 1 %
HCT: 29.8 % — ABNORMAL LOW (ref 36.0–46.0)
Hemoglobin: 9.6 g/dL — ABNORMAL LOW (ref 12.0–15.0)
Immature Granulocytes: 0 %
Lymphocytes Relative: 7 %
Lymphs Abs: 1 10*3/uL (ref 0.7–4.0)
MCH: 26.7 pg (ref 26.0–34.0)
MCHC: 32.2 g/dL (ref 30.0–36.0)
MCV: 82.8 fL (ref 80.0–100.0)
Monocytes Absolute: 0.6 10*3/uL (ref 0.1–1.0)
Monocytes Relative: 4 %
Neutro Abs: 11.3 10*3/uL — ABNORMAL HIGH (ref 1.7–7.7)
Neutrophils Relative %: 88 %
Platelets: 178 10*3/uL (ref 150–400)
RBC: 3.6 MIL/uL — ABNORMAL LOW (ref 3.87–5.11)
RDW: 17.3 % — ABNORMAL HIGH (ref 11.5–15.5)
WBC: 13.1 10*3/uL — ABNORMAL HIGH (ref 4.0–10.5)
nRBC: 0 % (ref 0.0–0.2)

## 2022-09-27 LAB — SALICYLATE LEVEL: Salicylate Lvl: <7 mg/dL — ABNORMAL LOW (ref 7.0–30.0)

## 2022-09-27 LAB — CBG MONITORING, ED: Glucose-Capillary: 77 mg/dL (ref 70–99)

## 2022-09-27 LAB — ETHANOL: Alcohol, Ethyl (B): <10 mg/dL (ref <10)

## 2022-09-27 MED ORDER — NALOXONE HCL 0.4 MG/ML IJ SOLN
0.4000 mg | Freq: Once | INTRAMUSCULAR | Status: AC
  Administered 2022-09-27: 0.4 mg via INTRAVENOUS
  Filled 2022-09-27: qty 1

## 2022-09-27 MED ORDER — SODIUM CHLORIDE 0.9 % IV BOLUS
1000.0000 mL | Freq: Once | INTRAVENOUS | Status: AC
  Administered 2022-09-27: 1000 mL via INTRAVENOUS

## 2022-09-27 MED ORDER — SODIUM CHLORIDE 0.9 % IV SOLN: INTRAVENOUS | Status: DC

## 2022-09-27 NOTE — ED Provider Notes (Incomplete)
[place] EMERGENCY DEPARTMENT AT [facility] Provider Note   CSN: [id number] Arrival date & time: 09/27/22  2046     History {Add pertinent medical, surgical, social history, OB history to HPI:1} Chief Complaint  Patient presents with  . Drug Overdose    [name] is a 67 y.o. female, hx of DM II, schizophrenia, who presents to the ED 2/2 to concern of possible drug overdose. Per daughter, pt just recently started back on her oxycodone after not being on them for several months. She went over to cook her mother dinner and patient was incoherent and nonsensical. Slurred words, sleepy per daughter.    LKN: yesterday per daughter.   Daughter called ambulance. Patient was holding can of soda, drops soda and started making heavy breathing sounds, and she fell down out of wheelchair. Did not hit head.   Was given Narcan       Home Medications Prior to Admission medications   Medication Sig Start Date End Date Taking? Authorizing Provider  albuterol (VENTOLIN HFA) 108 (90 Base) MCG/ACT inhaler Inhale 2 puffs into the lungs every 4 (four) hours as needed for wheezing or shortness of breath.     [provider]  amLODipine (NORVASC) 2.5 MG tablet Take 1 tablet (2.5 mg total) by mouth daily. 08/02/22   Berton Mount I, MD  aspirin EC 81 MG tablet Take 1 tablet (81 mg total) by mouth daily. 08/12/13   Viyuoh, Rolland Bimler, MD  BELBUCA 750 MCG FILM Take 750 mcg by mouth 2 (two) times daily. 07/10/22   [provider]  calcitRIOL (ROCALTROL) 0.25 MCG capsule Take 1 capsule (0.25 mcg total) by mouth 3 (three) times a week. 09/03/22   Carlus Pavlov, MD  Cholecalciferol (VITAMIN D-3) 1000 units CAPS Take 1,000 Units by mouth daily.    [provider]  cyclobenzaprine (FLEXERIL) 5 MG tablet Take 5 mg by mouth 3 (three) times daily as needed for muscle spasms. 07/10/22   [provider]  esomeprazole (NEXIUM) 40 MG capsule Take 1 capsule (40 mg  total) by mouth daily before breakfast. 10/30/14   Vassie Loll, MD  famotidine (PEPCID) 40 MG tablet Take 40 mg by mouth daily after supper. 06/16/20   [provider]  ferrous sulfate 325 (65 FE) MG tablet Take 325 mg by mouth in the morning.    [provider]  furosemide (LASIX) 40 MG tablet Take 2 tablets (80 mg total) by mouth daily. 07/25/22   Zannie Cove, MD  glucose blood (ONETOUCH VERIO) test strip TEST BLOOD SUGAR THREE TIMES DAILY 03/12/22   Carlus Pavlov, MD  GNP ULTICARE PEN NEEDLES 32G X 4 MM MISC 1 Needle by Does not apply route 4 (four) times daily. 09/25/22   Carlus Pavlov, MD  Insulin NPH, Human,, Isophane, (HUMULIN N KWIKPEN) 100 UNIT/ML Kiwkpen Inject 25 Units into the skin every morning. And pen needles 1/day 07/25/22   Zannie Cove, MD  Lancets [facility] DELICA PLUS [place]) MISC 1 each by Other route 3 (three) times daily. Use to check blood sugar 3 times a day 05/23/22   Carlus Pavlov, MD  levothyroxine (SYNTHROID) 200 MCG tablet Take 1 tablet (200 mcg total) by mouth daily before breakfast. 04/25/22   Carlus Pavlov, MD  linaclotide (LINZESS) 290 MCG CAPS capsule Take 290 mcg by mouth daily before breakfast.    [provider]  mirabegron ER (MYRBETRIQ) 25 MG TB24 tablet Take 25 mg by mouth in the morning.  [provider]  mupirocin ointment (BACTROBAN) 2 % Place 1 Application into the nose 2 (two) times daily. 08/02/22   Barnetta Chapel, MD  naloxone [facility]) nasal spray 4 mg/0.1 mL Place 1 spray into the nose as needed for opioid reversal. 08/01/20   [provider]  polyethylene glycol (MIRALAX / GLYCOLAX) 17 g packet Take 17 g by mouth daily as needed for mild constipation. 08/02/22   Barnetta Chapel, MD  polyvinyl alcohol (LIQUIFILM TEARS) 1.4 % ophthalmic solution Place 1 drop into both eyes as needed for dry eyes.    [provider]  potassium chloride SA (KLOR-CON) 20 MEQ tablet Take 20 mEq by  mouth daily. 06/22/20   [provider]  pregabalin (LYRICA) 300 MG capsule Take 300 mg by mouth 2 (two) times daily.    [provider]  rosuvastatin (CRESTOR) 20 MG tablet Take 20 mg by mouth daily.    [provider]  SURE COMFORT PEN NEEDLES 32G X 6 MM MISC USE TO INJECT INSULIN FOUR TIMES DAILY 03/13/21   Romero Belling, MD  valACYclovir (VALTREX) 1000 MG tablet Take 1,000 mg by mouth daily.    [provider]  venlafaxine XR (EFFEXOR-XR) 150 MG 24 hr capsule Take 150 mg by mouth 2 (two) times daily. 05/21/15   [provider]  ziprasidone (GEODON) 60 MG capsule Take 60 mg by mouth daily after supper.    [provider]      Allergies    Bee venom, Invokana [canagliflozin], Shellfish allergy, Buprenorphine hcl, and Other    Review of Systems   Review of Systems  Reason unable to perform ROS: Limited d/t AMS/lethargy.    Physical Exam Updated Vital Signs BP (!) 90/57 (BP Location: Left Arm)   Pulse 76   Temp 97.9 F (36.6 C) (Oral)   Resp 13   Ht 5\' 5"  (1.651 m)   Wt (!) 156.2 kg   SpO2 94%   BMI 57.30 kg/m  Physical Exam Vitals and nursing note reviewed.  Constitutional:      General: She is not in acute distress.    Appearance: She is well-developed. She is obese.     Comments: +lethargic  HENT:     Head: Normocephalic and atraumatic.  Eyes:     Conjunctiva/sclera: Conjunctivae normal.  Cardiovascular:     Rate and Rhythm: Normal rate and regular rhythm.     Heart sounds: No murmur heard. Pulmonary:     Effort: Pulmonary effort is normal. No respiratory distress.     Breath sounds: Normal breath sounds.  Abdominal:     Palpations: Abdomen is soft.     Tenderness: There is no abdominal tenderness.  Musculoskeletal:        General: No swelling.     Cervical back: Neck supple.  Skin:    General: Skin is warm and dry.     Capillary Refill: Capillary refill takes less than 2 seconds.  Neurological:     Mental  Status: She is alert.  Psychiatric:        Mood and Affect: Mood normal.     ED Results / Procedures / Treatments   Labs (all labs ordered are listed, but only abnormal results are displayed) Labs Reviewed - No data to display  EKG None  Radiology No results found.  Procedures Procedures  {Document cardiac monitor, telemetry assessment procedure when appropriate:1}  Medications Ordered in ED Medications - No data to display  ED Course/ Medical Decision  Making/ A&P   {   Click here for ABCD2, HEART and other calculatorsREFRESH Note before signing :1}                              Medical Decision Making Amount and/or Complexity of Data Reviewed Labs: ordered. Radiology: ordered.  Risk Prescription drug management.    Final Clinical Impression(s) / ED Diagnoses Final diagnoses:  None    Rx / DC Orders ED Discharge Orders     None

## 2022-09-27 NOTE — ED Provider Notes (Signed)
[place] EMERGENCY DEPARTMENT AT [facility] Provider Note   CSN: [id number] Arrival date & time: 09/27/22  2046     History {Add pertinent medical, surgical, social history, OB history to HPI:1} Chief Complaint  Patient presents with   Drug Overdose    [name] is a 67 y.o. female, hx of DM II, schizophrenia, who presents to the ED 2/2 to concern of possible drug overdose. Per daughter, pt just recently started back on her oxycodone after not being on them for several months. She went over to cook her mother dinner and patient was incoherent and nonsensical. Slurred words, sleepy per daughter.    LKN: yesterday per daughter.   Daughter called ambulance. Patient was holding can of soda, drops soda and started making heavy breathing sounds, and she fell down out of wheelchair. Did not hit head.   Was given Narcan       Home Medications Prior to Admission medications   Medication Sig Start Date End Date Taking? Authorizing Provider  albuterol (VENTOLIN HFA) 108 (90 Base) MCG/ACT inhaler Inhale 2 puffs into the lungs every 4 (four) hours as needed for wheezing or shortness of breath.     [provider]  amLODipine (NORVASC) 2.5 MG tablet Take 1 tablet (2.5 mg total) by mouth daily. 08/02/22   Berton Mount I, MD  aspirin EC 81 MG tablet Take 1 tablet (81 mg total) by mouth daily. 08/12/13   Viyuoh, Rolland Bimler, MD  BELBUCA 750 MCG FILM Take 750 mcg by mouth 2 (two) times daily. 07/10/22   [provider]  calcitRIOL (ROCALTROL) 0.25 MCG capsule Take 1 capsule (0.25 mcg total) by mouth 3 (three) times a week. 09/03/22   Carlus Pavlov, MD  Cholecalciferol (VITAMIN D-3) 1000 units CAPS Take 1,000 Units by mouth daily.    [provider]  cyclobenzaprine (FLEXERIL) 5 MG tablet Take 5 mg by mouth 3 (three) times daily as needed for muscle spasms. 07/10/22   [provider]  esomeprazole (NEXIUM) 40 MG capsule Take 1 capsule (40 mg  total) by mouth daily before breakfast. 10/30/14   Vassie Loll, MD  famotidine (PEPCID) 40 MG tablet Take 40 mg by mouth daily after supper. 06/16/20   [provider]  ferrous sulfate 325 (65 FE) MG tablet Take 325 mg by mouth in the morning.    [provider]  furosemide (LASIX) 40 MG tablet Take 2 tablets (80 mg total) by mouth daily. 07/25/22   Zannie Cove, MD  glucose blood (ONETOUCH VERIO) test strip TEST BLOOD SUGAR THREE TIMES DAILY 03/12/22   Carlus Pavlov, MD  GNP ULTICARE PEN NEEDLES 32G X 4 MM MISC 1 Needle by Does not apply route 4 (four) times daily. 09/25/22   Carlus Pavlov, MD  Insulin NPH, Human,, Isophane, (HUMULIN N KWIKPEN) 100 UNIT/ML Kiwkpen Inject 25 Units into the skin every morning. And pen needles 1/day 07/25/22   Zannie Cove, MD  Lancets [facility] DELICA PLUS [place]) MISC 1 each by Other route 3 (three) times daily. Use to check blood sugar 3 times a day 05/23/22   Carlus Pavlov, MD  levothyroxine (SYNTHROID) 200 MCG tablet Take 1 tablet (200 mcg total) by mouth daily before breakfast. 04/25/22   Carlus Pavlov, MD  linaclotide (LINZESS) 290 MCG CAPS capsule Take 290 mcg by mouth daily before breakfast.    [provider]  mirabegron ER (MYRBETRIQ) 25 MG TB24 tablet Take 25 mg by mouth in the morning.  [provider]  mupirocin ointment (BACTROBAN) 2 % Place 1 Application into the nose 2 (two) times daily. 08/02/22   Barnetta Chapel, MD  naloxone [facility]) nasal spray 4 mg/0.1 mL Place 1 spray into the nose as needed for opioid reversal. 08/01/20   [provider]  polyethylene glycol (MIRALAX / GLYCOLAX) 17 g packet Take 17 g by mouth daily as needed for mild constipation. 08/02/22   Barnetta Chapel, MD  polyvinyl alcohol (LIQUIFILM TEARS) 1.4 % ophthalmic solution Place 1 drop into both eyes as needed for dry eyes.    [provider]  potassium chloride SA (KLOR-CON) 20 MEQ tablet Take 20 mEq by  mouth daily. 06/22/20   [provider]  pregabalin (LYRICA) 300 MG capsule Take 300 mg by mouth 2 (two) times daily.    [provider]  rosuvastatin (CRESTOR) 20 MG tablet Take 20 mg by mouth daily.    [provider]  SURE COMFORT PEN NEEDLES 32G X 6 MM MISC USE TO INJECT INSULIN FOUR TIMES DAILY 03/13/21   Romero Belling, MD  valACYclovir (VALTREX) 1000 MG tablet Take 1,000 mg by mouth daily.    [provider]  venlafaxine XR (EFFEXOR-XR) 150 MG 24 hr capsule Take 150 mg by mouth 2 (two) times daily. 05/21/15   [provider]  ziprasidone (GEODON) 60 MG capsule Take 60 mg by mouth daily after supper.    [provider]      Allergies    Bee venom, Invokana [canagliflozin], Shellfish allergy, Buprenorphine hcl, and Other    Review of Systems   Review of Systems  Reason unable to perform ROS: Limited d/t AMS/lethargy.    Physical Exam Updated Vital Signs BP (!) 90/57 (BP Location: Left Arm)   Pulse 76   Temp 97.9 F (36.6 C) (Oral)   Resp 13   Ht 5\' 5"  (1.651 m)   Wt (!) 156.2 kg   SpO2 94%   BMI 57.30 kg/m  Physical Exam Vitals and nursing note reviewed.  Constitutional:      General: She is not in acute distress.    Appearance: She is well-developed. She is obese.     Comments: +lethargic  HENT:     Head: Normocephalic and atraumatic.  Eyes:     Conjunctiva/sclera: Conjunctivae normal.  Cardiovascular:     Rate and Rhythm: Normal rate and regular rhythm.     Heart sounds: No murmur heard. Pulmonary:     Effort: Pulmonary effort is normal. No respiratory distress.     Breath sounds: Normal breath sounds.  Abdominal:     Palpations: Abdomen is soft.     Tenderness: There is no abdominal tenderness.  Musculoskeletal:        General: No swelling.     Cervical back: Neck supple.  Skin:    General: Skin is warm and dry.     Capillary Refill: Capillary refill takes less than 2 seconds.  Neurological:     Mental  Status: She is alert.  Psychiatric:        Mood and Affect: Mood normal.     ED Results / Procedures / Treatments   Labs (all labs ordered are listed, but only abnormal results are displayed) Labs Reviewed - No data to display  EKG None  Radiology No results found.  Procedures Procedures  {Document cardiac monitor, telemetry assessment procedure when appropriate:1}  Medications Ordered in ED Medications - No data to display  ED Course/ Medical Decision  Making/ A&P   {   Click here for ABCD2, HEART and other calculatorsREFRESH Note before signing :1}                              Medical Decision Making Amount and/or Complexity of Data Reviewed Labs: ordered. Radiology: ordered.  Risk Prescription drug management.   ***  {Document critical care time when appropriate:1} {Document review of labs and clinical decision tools ie heart score, Chads2Vasc2 etc:1}  {Document your independent review of radiology images, and any outside records:1} {Document your discussion with family members, caretakers, and with consultants:1} {Document social determinants of health affecting pt's care:1} {Document your decision making why or why not admission, treatments were needed:1} Final Clinical Impression(s) / ED Diagnoses Final diagnoses:  None    Rx / DC Orders ED Discharge Orders     None

## 2022-09-27 NOTE — ED Triage Notes (Signed)
Chief Complaint  Patient presents with   Drug Overdose   Pt presents to ED 29 via EMS from home with above.  Per report, pt started back on her narcotic pain medication today after being off these for about 3 months.  EMS states family reports pt slid off chair to floor today.  Upon EMS arrival, pupils pinpoint and blood pressure 70 palpated.  Pt given intranasal Narcan which improved pressure to 102 palpated.  Pt very drowsy during triage, drifts off to sleep quickly.  Able to answer some questions.

## 2022-09-28 DIAGNOSIS — G219 Secondary parkinsonism, unspecified: Secondary | ICD-10-CM | POA: Diagnosis present

## 2022-09-28 DIAGNOSIS — E860 Dehydration: Secondary | ICD-10-CM | POA: Diagnosis present

## 2022-09-28 DIAGNOSIS — T502X5A Adverse effect of carbonic-anhydrase inhibitors, benzothiadiazides and other diuretics, initial encounter: Secondary | ICD-10-CM | POA: Diagnosis present

## 2022-09-28 DIAGNOSIS — G894 Chronic pain syndrome: Secondary | ICD-10-CM | POA: Diagnosis present

## 2022-09-28 DIAGNOSIS — N2581 Secondary hyperparathyroidism of renal origin: Secondary | ICD-10-CM | POA: Diagnosis present

## 2022-09-28 DIAGNOSIS — T40601A Poisoning by unspecified narcotics, accidental (unintentional), initial encounter: Principal | ICD-10-CM

## 2022-09-28 DIAGNOSIS — R5383 Other fatigue: Secondary | ICD-10-CM | POA: Diagnosis present

## 2022-09-28 DIAGNOSIS — N178 Other acute kidney failure: Secondary | ICD-10-CM | POA: Diagnosis present

## 2022-09-28 DIAGNOSIS — Z794 Long term (current) use of insulin: Secondary | ICD-10-CM | POA: Diagnosis not present

## 2022-09-28 DIAGNOSIS — I5032 Chronic diastolic (congestive) heart failure: Secondary | ICD-10-CM | POA: Diagnosis present

## 2022-09-28 DIAGNOSIS — I959 Hypotension, unspecified: Secondary | ICD-10-CM | POA: Diagnosis present

## 2022-09-28 DIAGNOSIS — E875 Hyperkalemia: Secondary | ICD-10-CM | POA: Diagnosis present

## 2022-09-28 DIAGNOSIS — E1122 Type 2 diabetes mellitus with diabetic chronic kidney disease: Secondary | ICD-10-CM | POA: Diagnosis present

## 2022-09-28 DIAGNOSIS — N189 Chronic kidney disease, unspecified: Secondary | ICD-10-CM

## 2022-09-28 DIAGNOSIS — T50901A Poisoning by unspecified drugs, medicaments and biological substances, accidental (unintentional), initial encounter: Secondary | ICD-10-CM | POA: Diagnosis not present

## 2022-09-28 DIAGNOSIS — I130 Hypertensive heart and chronic kidney disease with heart failure and stage 1 through stage 4 chronic kidney disease, or unspecified chronic kidney disease: Secondary | ICD-10-CM | POA: Diagnosis present

## 2022-09-28 DIAGNOSIS — E872 Acidosis, unspecified: Secondary | ICD-10-CM | POA: Diagnosis present

## 2022-09-28 DIAGNOSIS — J9601 Acute respiratory failure with hypoxia: Secondary | ICD-10-CM | POA: Diagnosis present

## 2022-09-28 DIAGNOSIS — J45909 Unspecified asthma, uncomplicated: Secondary | ICD-10-CM | POA: Diagnosis present

## 2022-09-28 DIAGNOSIS — F200 Paranoid schizophrenia: Secondary | ICD-10-CM | POA: Diagnosis present

## 2022-09-28 DIAGNOSIS — G4733 Obstructive sleep apnea (adult) (pediatric): Secondary | ICD-10-CM

## 2022-09-28 DIAGNOSIS — N179 Acute kidney failure, unspecified: Secondary | ICD-10-CM

## 2022-09-28 DIAGNOSIS — Z6841 Body Mass Index (BMI) 40.0 and over, adult: Secondary | ICD-10-CM | POA: Diagnosis not present

## 2022-09-28 DIAGNOSIS — W050XXA Fall from non-moving wheelchair, initial encounter: Secondary | ICD-10-CM | POA: Diagnosis present

## 2022-09-28 DIAGNOSIS — E890 Postprocedural hypothyroidism: Secondary | ICD-10-CM | POA: Diagnosis present

## 2022-09-28 DIAGNOSIS — E785 Hyperlipidemia, unspecified: Secondary | ICD-10-CM | POA: Diagnosis present

## 2022-09-28 DIAGNOSIS — F32A Depression, unspecified: Secondary | ICD-10-CM | POA: Diagnosis present

## 2022-09-28 DIAGNOSIS — E662 Morbid (severe) obesity with alveolar hypoventilation: Secondary | ICD-10-CM | POA: Diagnosis present

## 2022-09-28 DIAGNOSIS — N1832 Chronic kidney disease, stage 3b: Secondary | ICD-10-CM | POA: Diagnosis present

## 2022-09-28 DIAGNOSIS — E1142 Type 2 diabetes mellitus with diabetic polyneuropathy: Secondary | ICD-10-CM | POA: Diagnosis present

## 2022-09-28 DIAGNOSIS — F419 Anxiety disorder, unspecified: Secondary | ICD-10-CM

## 2022-09-28 DIAGNOSIS — E119 Type 2 diabetes mellitus without complications: Secondary | ICD-10-CM

## 2022-09-28 LAB — RAPID URINE DRUG SCREEN, HOSP PERFORMED
Amphetamines: NOT DETECTED
Barbiturates: NOT DETECTED
Benzodiazepines: NOT DETECTED
Cocaine: NOT DETECTED
Opiates: NOT DETECTED
Tetrahydrocannabinol: NOT DETECTED

## 2022-09-28 LAB — CBG MONITORING, ED
Glucose-Capillary: 104 mg/dL — ABNORMAL HIGH (ref 70–99)
Glucose-Capillary: 103 mg/dL — ABNORMAL HIGH (ref 70–99)
Glucose-Capillary: 81 mg/dL (ref 70–99)
Glucose-Capillary: 103 mg/dL — ABNORMAL HIGH (ref 70–99)
Glucose-Capillary: 84 mg/dL (ref 70–99)

## 2022-09-28 LAB — CBC WITH DIFFERENTIAL/PLATELET
Abs Immature Granulocytes: 0.05 10*3/uL (ref 0.00–0.07)
Basophils Absolute: 0.1 10*3/uL (ref 0.0–0.1)
Basophils Relative: 1 %
Eosinophils Absolute: 0.3 10*3/uL (ref 0.0–0.5)
Eosinophils Relative: 3 %
HCT: 27.9 % — ABNORMAL LOW (ref 36.0–46.0)
Hemoglobin: 9 g/dL — ABNORMAL LOW (ref 12.0–15.0)
Immature Granulocytes: 1 %
Lymphocytes Relative: 14 %
Lymphs Abs: 1.5 10*3/uL (ref 0.7–4.0)
MCH: 26.5 pg (ref 26.0–34.0)
MCHC: 32.3 g/dL (ref 30.0–36.0)
MCV: 82.1 fL (ref 80.0–100.0)
Monocytes Absolute: 0.5 10*3/uL (ref 0.1–1.0)
Monocytes Relative: 5 %
Neutro Abs: 7.9 10*3/uL — ABNORMAL HIGH (ref 1.7–7.7)
Neutrophils Relative %: 76 %
Platelets: 171 10*3/uL (ref 150–400)
RBC: 3.4 MIL/uL — ABNORMAL LOW (ref 3.87–5.11)
RDW: 17.2 % — ABNORMAL HIGH (ref 11.5–15.5)
WBC: 10.3 10*3/uL (ref 4.0–10.5)
nRBC: 0 % (ref 0.0–0.2)

## 2022-09-28 LAB — BASIC METABOLIC PANEL
Anion gap: 17 — ABNORMAL HIGH (ref 5–15)
BUN: 124 mg/dL — ABNORMAL HIGH (ref 8–23)
CO2: 15 mmol/L — ABNORMAL LOW (ref 22–32)
Calcium: 8.6 mg/dL — ABNORMAL LOW (ref 8.9–10.3)
Chloride: 97 mmol/L — ABNORMAL LOW (ref 98–111)
Creatinine, Ser: 4.34 mg/dL — ABNORMAL HIGH (ref 0.44–1.00)
GFR, Estimated: 11 mL/min — ABNORMAL LOW (ref >60)
Glucose, Bld: 102 mg/dL — ABNORMAL HIGH (ref 70–99)
Potassium: 5.2 mmol/L — ABNORMAL HIGH (ref 3.5–5.1)
Sodium: 129 mmol/L — ABNORMAL LOW (ref 135–145)

## 2022-09-28 LAB — URINALYSIS, ROUTINE W REFLEX MICROSCOPIC
Bilirubin Urine: NEGATIVE
Glucose, UA: NEGATIVE mg/dL
Hgb urine dipstick: NEGATIVE
Ketones, ur: NEGATIVE mg/dL
Leukocytes,Ua: NEGATIVE
Nitrite: NEGATIVE
Protein, ur: NEGATIVE mg/dL
Specific Gravity, Urine: 1.011 (ref 1.005–1.030)
pH: 5 (ref 5.0–8.0)

## 2022-09-28 LAB — GLUCOSE, CAPILLARY
Glucose-Capillary: 90 mg/dL (ref 70–99)
Glucose-Capillary: 187 mg/dL — ABNORMAL HIGH (ref 70–99)

## 2022-09-28 LAB — POTASSIUM: Potassium: 5.1 mmol/L (ref 3.5–5.1)

## 2022-09-28 LAB — MAGNESIUM: Magnesium: 2.6 mg/dL — ABNORMAL HIGH (ref 1.7–2.4)

## 2022-09-28 MED ORDER — ROSUVASTATIN CALCIUM 20 MG PO TABS
20.0000 mg | ORAL_TABLET | Freq: Every day | ORAL | Status: DC
  Administered 2022-09-28 – 2022-09-30 (×3): 20 mg via ORAL
  Filled 2022-09-28 (×3): qty 1

## 2022-09-28 MED ORDER — INSULIN NPH (HUMAN) (ISOPHANE) 100 UNIT/ML ~~LOC~~ SUSP
25.0000 [IU] | Freq: Every day | SUBCUTANEOUS | Status: DC
  Filled 2022-09-28: qty 10

## 2022-09-28 MED ORDER — VALACYCLOVIR HCL 500 MG PO TABS
500.0000 mg | ORAL_TABLET | Freq: Every day | ORAL | Status: DC
  Administered 2022-09-28 – 2022-09-30 (×3): 500 mg via ORAL
  Filled 2022-09-28 (×3): qty 1

## 2022-09-28 MED ORDER — MIRABEGRON ER 25 MG PO TB24
25.0000 mg | ORAL_TABLET | Freq: Every day | ORAL | Status: DC
  Administered 2022-09-28 – 2022-09-30 (×3): 25 mg via ORAL
  Filled 2022-09-28 (×3): qty 1

## 2022-09-28 MED ORDER — DESMOPRESSIN ACETATE 0.1 MG PO TABS
200.0000 ug | ORAL_TABLET | Freq: Every day | ORAL | Status: DC
  Administered 2022-09-28 – 2022-09-29 (×2): 200 ug via ORAL
  Filled 2022-09-28 (×3): qty 2

## 2022-09-28 MED ORDER — VENLAFAXINE HCL ER 75 MG PO CP24
150.0000 mg | ORAL_CAPSULE | Freq: Two times a day (BID) | ORAL | Status: DC
  Administered 2022-09-28 – 2022-09-30 (×5): 150 mg via ORAL
  Filled 2022-09-28 (×5): qty 2

## 2022-09-28 MED ORDER — SUCRALFATE 1 G PO TABS
1.0000 g | ORAL_TABLET | Freq: Every day | ORAL | Status: DC
  Administered 2022-09-28 – 2022-09-30 (×3): 1 g via ORAL
  Filled 2022-09-28 (×3): qty 1

## 2022-09-28 MED ORDER — BUPRENORPHINE HCL 750 MCG BU FILM: 750.0000 ug | ORAL_FILM | Freq: Two times a day (BID) | BUCCAL | Status: DC

## 2022-09-28 MED ORDER — PANTOPRAZOLE SODIUM 40 MG PO TBEC
40.0000 mg | DELAYED_RELEASE_TABLET | Freq: Every day | ORAL | Status: DC
  Administered 2022-09-28 – 2022-09-30 (×3): 40 mg via ORAL
  Filled 2022-09-28 (×3): qty 1

## 2022-09-28 MED ORDER — INSULIN ASPART 100 UNIT/ML IJ SOLN: 0.0000 [IU] | INTRAMUSCULAR | Status: DC

## 2022-09-28 MED ORDER — ZIPRASIDONE HCL 40 MG PO CAPS
60.0000 mg | ORAL_CAPSULE | Freq: Every day | ORAL | Status: DC
  Administered 2022-09-28 – 2022-09-29 (×2): 60 mg via ORAL
  Filled 2022-09-28 (×3): qty 1

## 2022-09-28 MED ORDER — ACETAMINOPHEN 325 MG PO TABS
650.0000 mg | ORAL_TABLET | Freq: Four times a day (QID) | ORAL | Status: DC | PRN
  Administered 2022-09-28: 650 mg via ORAL
  Filled 2022-09-28: qty 2

## 2022-09-28 MED ORDER — SENNOSIDES-DOCUSATE SODIUM 8.6-50 MG PO TABS: 1.0000 | ORAL_TABLET | Freq: Every evening | ORAL | Status: DC | PRN

## 2022-09-28 MED ORDER — INSULIN ASPART 100 UNIT/ML IJ SOLN: 0.0000 [IU] | Freq: Three times a day (TID) | INTRAMUSCULAR | Status: DC

## 2022-09-28 MED ORDER — NALOXONE HCL 4 MG/0.1ML NA LIQD: 1.0000 | NASAL | Status: DC | PRN

## 2022-09-28 MED ORDER — FERROUS SULFATE 325 (65 FE) MG PO TABS
325.0000 mg | ORAL_TABLET | Freq: Every morning | ORAL | Status: DC
  Administered 2022-09-29 – 2022-09-30 (×2): 325 mg via ORAL
  Filled 2022-09-28 (×2): qty 1

## 2022-09-28 MED ORDER — ASPIRIN 81 MG PO TBEC
81.0000 mg | DELAYED_RELEASE_TABLET | Freq: Every day | ORAL | Status: DC
  Administered 2022-09-28 – 2022-09-30 (×3): 81 mg via ORAL
  Filled 2022-09-28 (×3): qty 1

## 2022-09-28 MED ORDER — DOXEPIN HCL 25 MG PO CAPS
50.0000 mg | ORAL_CAPSULE | Freq: Every day | ORAL | Status: DC
  Administered 2022-09-28 – 2022-09-29 (×2): 50 mg via ORAL
  Filled 2022-09-28 (×3): qty 2
  Filled 2022-09-28: qty 1

## 2022-09-28 MED ORDER — CYCLOBENZAPRINE HCL 10 MG PO TABS
5.0000 mg | ORAL_TABLET | Freq: Every day | ORAL | Status: DC
  Administered 2022-09-28 – 2022-09-30 (×3): 5 mg via ORAL
  Filled 2022-09-28: qty 0.5
  Filled 2022-09-28: qty 1
  Filled 2022-09-28 (×2): qty 0.5
  Filled 2022-09-28: qty 1

## 2022-09-28 MED ORDER — ACETAMINOPHEN 500 MG PO TABS
1000.0000 mg | ORAL_TABLET | Freq: Two times a day (BID) | ORAL | Status: DC
  Administered 2022-09-28 – 2022-09-30 (×5): 1000 mg via ORAL
  Filled 2022-09-28 (×5): qty 2

## 2022-09-28 MED ORDER — HEPARIN SODIUM (PORCINE) 5000 UNIT/ML IJ SOLN
5000.0000 [IU] | Freq: Three times a day (TID) | INTRAMUSCULAR | Status: DC
  Administered 2022-09-28 – 2022-09-30 (×7): 5000 [IU] via SUBCUTANEOUS
  Filled 2022-09-28 (×7): qty 1

## 2022-09-28 MED ORDER — FAMOTIDINE 20 MG PO TABS
20.0000 mg | ORAL_TABLET | Freq: Every day | ORAL | Status: DC
  Administered 2022-09-28 – 2022-09-29 (×2): 20 mg via ORAL
  Filled 2022-09-28 (×2): qty 1

## 2022-09-28 MED ORDER — CALCITRIOL 0.25 MCG PO CAPS
0.2500 ug | ORAL_CAPSULE | ORAL | Status: DC
  Administered 2022-09-29: 0.25 ug via ORAL
  Filled 2022-09-28 (×2): qty 1

## 2022-09-28 MED ORDER — ACETAMINOPHEN 650 MG RE SUPP: 650.0000 mg | Freq: Four times a day (QID) | RECTAL | Status: DC | PRN

## 2022-09-28 MED ORDER — LEVOTHYROXINE SODIUM 100 MCG PO TABS
200.0000 ug | ORAL_TABLET | Freq: Every day | ORAL | Status: DC
  Administered 2022-09-28 – 2022-09-30 (×3): 200 ug via ORAL
  Filled 2022-09-28 (×4): qty 2

## 2022-09-28 NOTE — ED Notes (Signed)
Pt somewhat more awake at time of obtaining labs.  Pt checking cell phone and able to speak with this RN more.

## 2022-09-28 NOTE — Progress Notes (Addendum)
  PROGRESS NOTE  Patient admitted earlier this morning. See H&P.   [name] is a 67 year old female with PMHx of extreme morbid obesity, OSA on CPAP, HFpEF, chronic pain syndrome, CKD IIIb, HTN, hypothyroidism.  The patient has had multiple recent admissions 6/13-6/15, 6/4-6/7 with hypoxia related to OSA/OHS/HFpEF.  Patient presents from home with altered mental status.  She used to use oxycodone for a long time, but has been off of it for the past 3 months.  Her narcotic was refilled and patient restarted taking it on Thursday for her chronic pain.  She states that she became dizzy.  She also has been taking Lasix twice daily.  Daughter found that patient was confused, incoherent, nonsensical, slurring words and sleepy.  EMS was called and patient was given Narcan.  Patient seen and examined in the emergency department.  She is alert and oriented today.  States that she lives at home alone, daughter and caregivers come to her home to help her.  She is nonambulatory, is bedbound/can scoot herself to the wheelchair.  UA and UDS are negative.  AKI improved slightly overnight.   Continue to treat with IV hydration.  Her baseline creatinine is 1.6 Discontinue oxycodone indefinitely  Med rec confirms that patient is taking: Norvasc 10 mg daily,  Losartan-HCTZ 100-25 mg daily,  Olmesartan-amlodipine-HCTZ 40-10-12.5mg  daily.  Torsemide 20mg     Status is: Inpatient Remains inpatient appropriate because: AMS, IVF    Noralee Stain, DO [facility] 09/28/2022, 1:26 PM  Available via Epic secure chat 7am-7pm After these hours, please refer to coverage provider listed on [email or web address]

## 2022-09-28 NOTE — ED Provider Notes (Cosign Needed Addendum)
1:33 AM Case discussed with Dr. Joneen Roach of [facility] who will admit for further management of AKI and secondary metabolic acidosis.  2:20 AM Repeat K resulted at 5.1. No intervention indicated.   Antony Madura, PA-C 09/28/22 0133    Antony Madura, PA-C 09/28/22 0220    Tilden Fossa, MD 09/29/22 [phone]

## 2022-09-28 NOTE — Progress Notes (Signed)
Pt refused CPAP she states that she does not like neither mask that we can provide for her. The only mask she wants to use is the nasal prongs that she uses at home. I offered the nasal mask & the full face patient states she's not going to wear either.

## 2022-09-28 NOTE — ED Notes (Signed)
While attempting to assist pt onto bedside commode, pt's legs became weak and started to give out on her.  This RN and tech at bedside able to guide patient to sitting position on floor.  Pt did not strike floor and gently sat down.  This RN and tech unable to lift patient back to bed.  ED security called for assistance with lifting pt back into bed.  This RN attempted to obtain Purewick for pt but none available per ED charge RN.  Inpatient unit able to provide purewick to assist with urinary needs as pt unable to help roll for bedpan due to drowsiness and pt unable to stand for bedside commode use due to leg weakness.

## 2022-09-28 NOTE — ED Notes (Signed)
ED TO INPATIENT HANDOFF REPORT  ED Nurse Name and Phone #: Denice Bors [phone]  S Name/Age/Gender [name] 67 y.o. female Room/Bed: 009C/009C  Code Status   Code Status: Full Code  Home/SNF/Other Home Patient oriented to: self, place, time, and situation Is this baseline? Yes   Triage Complete: Triage complete  Chief Complaint Overdose, accidental or unintentional, initial encounter [T50.901A]  Triage Note Chief Complaint  Patient presents with   Drug Overdose   Pt presents to ED 29 via EMS from home with above.  Per report, pt started back on her narcotic pain medication today after being off these for about 3 months.  EMS states family reports pt slid off chair to floor today.  Upon EMS arrival, pupils pinpoint and blood pressure 70 palpated.  Pt given intranasal Narcan which improved pressure to 102 palpated.  Pt very drowsy during triage, drifts off to sleep quickly.  Able to answer some questions.   Allergies Allergies  Allergen Reactions   Bee Venom Anaphylaxis   Invokana [Canagliflozin] Anaphylaxis    Dyspnea and urinary retention   Shellfish Allergy Anaphylaxis   Buprenorphine Hcl Other (See Comments)    Overly sedated with morphine drip.   Other Other (See Comments)    Cats - bad asthma, stopped up    Level of Care/Admitting Diagnosis ED Disposition     ED Disposition  Admit   Condition  --   Comment  Hospital [facility] [[id number]]  Level of Care: Telemetry Cardiac [[id number]]  May admit patient to [facility] or [facility] if equivalent level of care is available:: No  Covid Evaluation: Asymptomatic - no recent exposure (last 10 days) testing not required  Diagnosis: Overdose, accidental or unintentional, initial encounter [[id number]]  Admitting Physician: Gery Pray [[id number]]  Attending Physician: Gery Pray [[id number]]  Certification:: I certify this patient will need inpatient services for at least 2 midnights   Estimated Length of Stay: 2          B Medical/Surgery History Past Medical History:  Diagnosis Date   Anemia    Anxiety and depression    Asthma    Breast cancer (HCC) 02/13/2012   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   partial   benign cyst  Left    removed from left breast   BREAST BIOPSY Right 02/13/2012   BREAST LUMPECTOMY  03/03/2012   Procedure: LUMPECTOMY;  Surgeon: Currie Paris, MD;  Location: [facility] OR;  Service: General;  Laterality: Right;   BREAST SURGERY Right 01/2012   "cancer"   CHOLECYSTECTOMY  1980's?   COLONOSCOPY WITH PROPOFOL N/A 09/28/2012   Procedure: COLONOSCOPY WITH PROPOFOL;  Surgeon: Shirley Friar, MD;  Location: [facility] ENDOSCOPY;  Service: Endoscopy;  Laterality: N/A;   FOOT FRACTURE SURGERY Right 1990's   JOINT REPLACEMENT      x 3   LESION EXCISION N/A 03/13/2021   Procedure: EXCISION SCALP SEBACEOUS CYSTS x2;  Surgeon: Axel Filler, MD;  Location: [facility];  Service: General;  Laterality: N/A;  60 MINUTES LOCAL & MAC   LUMBAR LAMINECTOMY/DECOMPRESSION MICRODISCECTOMY N/A 12/05/2016   Procedure: LEFT L5-S1 MICRODISCECTOMY;  Surgeon: Eldred Manges, MD;  Location: [facility] OR;  Service: Orthopedics;   Laterality: N/A;   REVISION TOTAL KNEE ARTHROPLASTY  07/2009   SHOULDER OPEN ROTATOR CUFF REPAIR Left 1990's   THYROIDECTOMY  1970's   TOTAL KNEE ARTHROPLASTY Bilateral 2009-06/2009   left; right     A IV Location/Drains/Wounds Patient Lines/Drains/Airways Status     Active Line/Drains/Airways     Name Placement date Placement time Site Days   Peripheral IV 09/27/22 20 G 1.88" Left Forearm 09/27/22  2258  Forearm  1   External Urinary Catheter 09/28/22  0325  --  less than 1   Wound / Incision (Open or Dehisced) 07/24/22 Irritant Dermatitis (Moisture Associated Skin Damage) Buttocks Right;Left moisture associaterd skin damage to bilat buttocks 07/24/22  --  Buttocks  66   Wound / Incision (Open or Dehisced) 07/24/22 Other (Comment) Toe (Comment  which one) Right black dry scab to right 4th toe 07/24/22  --  Toe (Comment  which one)  66            Intake/Output Last 24 hours  Intake/Output Summary (Last 24 hours) at 09/28/2022 1221 Last data filed at 09/28/2022 1119 Gross per 24 hour  Intake 1044.51 ml  Output --  Net 1044.51 ml    Labs/Imaging Results for orders placed or performed during the hospital encounter of 09/27/22 (from the past 48 hour(s))  Comprehensive metabolic panel     Status: Abnormal   Collection Time: 09/27/22 11:02 PM  Result Value Ref Range   Sodium 127 (L) 135 - 145 mmol/L   Potassium 5.7 (H) 3.5 - 5.1 mmol/L   Chloride 91 (L) 98 - 111 mmol/L   CO2 17 (L) 22 - 32 mmol/L   Glucose, Bld 117 (H) 70 - 99 mg/dL    Comment: Glucose reference range applies only to samples taken after fasting for at least 8 hours.   BUN 126 (H) 8 - 23 mg/dL   Creatinine, Ser 5.28 (H) 0.44 - 1.00 mg/dL   Calcium 9.2 8.9 - 41.3 mg/dL   Total Protein 6.6 6.5 - 8.1 g/dL   Albumin 3.9 3.5 - 5.0 g/dL   AST 15 15 - 41 U/L   ALT 23 0 - 44 U/L   Alkaline Phosphatase 62 38 - 126 U/L   Total Bilirubin 0.4 0.3 - 1.2 mg/dL   GFR, Estimated 9 (L) >60 mL/min    Comment:  (NOTE) Calculated using the CKD-EPI Creatinine Equation (2021)    Anion gap 19 (H) 5 - 15    Comment: Performed at [facility], [address], [place], [place] [address]  Ethanol     Status: None   Collection Time: 09/27/22 11:02 PM  Result Value Ref Range   Alcohol, Ethyl (B) <10 <10 mg/dL    Comment: (NOTE) Lowest detectable limit for serum alcohol is 10 mg/dL.  For medical purposes only. Performed at [facility], [address] [place], [place] [address]   Salicylate level     Status: Abnormal   Collection Time: 09/27/22 11:02 PM  Result Value Ref Range   Salicylate Lvl <7.0 (L) 7.0 - 30.0 mg/dL    Comment: Performed at [facility], [address], [place], [place] [address]  CBC with Differential     Status: Abnormal   Collection Time: 09/27/22 11:02 PM  Result Value Ref Range   WBC 13.1 (H) 4.0 - 10.5 K/uL   RBC 3.60 (L) 3.87 - 5.11 MIL/uL   Hemoglobin 9.6 (L) 12.0 - 15.0 g/dL   HCT 03.4 (L) 74.2 - 59.5 %   MCV 82.8 80.0 - 100.0 fL   MCH 26.7 26.0 - 34.0 pg   MCHC 32.2 30.0 - 36.0 g/dL   RDW 63.8 (H) 75.6 - 43.3 %   Platelets 178 150 - 400 K/uL   nRBC 0.0 0.0 - 0.2 %   Neutrophils Relative % 88 %   Neutro Abs 11.3 (H) 1.7 - 7.7 K/uL   Lymphocytes Relative 7 %   Lymphs Abs 1.0 0.7 - 4.0 K/uL   Monocytes Relative 4 %   Monocytes Absolute 0.6 0.1 - 1.0 K/uL   Eosinophils Relative 1 %   Eosinophils Absolute 0.2 0.0 - 0.5 K/uL   Basophils Relative 0 %   Basophils Absolute 0.0 0.0 - 0.1 K/uL   Immature Granulocytes 0 %   Abs Immature Granulocytes 0.05 0.00 - 0.07 K/uL    Comment: Performed at [facility], [address], [place], [place] [address]  POC CBG, ED     Status: None   Collection Time: 09/27/22 11:48 PM  Result Value Ref Range   Glucose-Capillary 77 70 - 99 mg/dL    Comment: Glucose reference range applies only to samples taken after fasting for at least 8 hours.  Potassium     Status: None   Collection Time: 09/28/22  1:17  AM  Result Value Ref Range   Potassium 5.1 3.5 - 5.1 mmol/L    Comment: Performed at [facility], [address], [place], [place] [address]  CBG monitoring, ED     Status: Abnormal   Collection Time: 09/28/22  1:35 AM  Result Value Ref Range   Glucose-Capillary 104 (H) 70 - 99 mg/dL    Comment: Glucose reference range applies only to samples taken after fasting for at least 8 hours.  Basic metabolic panel     Status: Abnormal   Collection Time: 09/28/22  4:05 AM  Result Value Ref Range   Sodium 129 (L) 135 - 145 mmol/L   Potassium 5.2 (H) 3.5 - 5.1 mmol/L   Chloride 97 (L) 98 - 111 mmol/L   CO2 15 (L) 22 - 32 mmol/L   Glucose, Bld 102 (H) 70 - 99 mg/dL    Comment: Glucose reference range applies only to samples taken after fasting for at least 8 hours.   BUN 124 (H) 8 - 23 mg/dL   Creatinine, Ser 0.63 (H) 0.44 - 1.00 mg/dL   Calcium 8.6 (L) 8.9 - 10.3 mg/dL   GFR, Estimated 11 (L) >60 mL/min    Comment: (NOTE) Calculated using the CKD-EPI Creatinine Equation (2021)    Anion gap 17 (H) 5 - 15    Comment: Performed at [facility], [address] [place], [place] [address]  Magnesium     Status: Abnormal   Collection Time: 09/28/22  4:05  AM  Result Value Ref Range   Magnesium 2.6 (H) 1.7 - 2.4 mg/dL    Comment: Performed at [facility], [address] [place], [place] [address]  CBG monitoring, ED     Status: Abnormal   Collection Time: 09/28/22  4:22 AM  Result Value Ref Range   Glucose-Capillary 103 (H) 70 - 99 mg/dL    Comment: Glucose reference range applies only to samples taken after fasting for at least 8 hours.  CBC with Differential/Platelet     Status: Abnormal   Collection Time: 09/28/22  4:51 AM  Result Value Ref Range   WBC 10.3 4.0 - 10.5 K/uL   RBC 3.40 (L) 3.87 - 5.11 MIL/uL   Hemoglobin 9.0 (L) 12.0 - 15.0 g/dL   HCT 40.1 (L) 02.7 - 25.3 %   MCV 82.1 80.0 - 100.0 fL   MCH 26.5 26.0 - 34.0 pg   MCHC 32.3 30.0 - 36.0 g/dL   RDW 66.4  (H) 40.3 - 15.5 %   Platelets 171 150 - 400 K/uL   nRBC 0.0 0.0 - 0.2 %   Neutrophils Relative % 76 %   Neutro Abs 7.9 (H) 1.7 - 7.7 K/uL   Lymphocytes Relative 14 %   Lymphs Abs 1.5 0.7 - 4.0 K/uL   Monocytes Relative 5 %   Monocytes Absolute 0.5 0.1 - 1.0 K/uL   Eosinophils Relative 3 %   Eosinophils Absolute 0.3 0.0 - 0.5 K/uL   Basophils Relative 1 %   Basophils Absolute 0.1 0.0 - 0.1 K/uL   Immature Granulocytes 1 %   Abs Immature Granulocytes 0.05 0.00 - 0.07 K/uL    Comment: Performed at [facility], [address], [place], [place] [address]  CBG monitoring, ED     Status: None   Collection Time: 09/28/22  8:11 AM  Result Value Ref Range   Glucose-Capillary 81 70 - 99 mg/dL    Comment: Glucose reference range applies only to samples taken after fasting for at least 8 hours.  Rapid urine drug screen (hospital performed)     Status: None   Collection Time: 09/28/22  9:35 AM  Result Value Ref Range   Opiates NONE DETECTED NONE DETECTED   Cocaine NONE DETECTED NONE DETECTED   Benzodiazepines NONE DETECTED NONE DETECTED   Amphetamines NONE DETECTED NONE DETECTED   Tetrahydrocannabinol NONE DETECTED NONE DETECTED   Barbiturates NONE DETECTED NONE DETECTED    Comment: (NOTE) DRUG SCREEN FOR MEDICAL PURPOSES ONLY.  IF CONFIRMATION IS NEEDED FOR ANY PURPOSE, NOTIFY LAB WITHIN 5 DAYS.  LOWEST DETECTABLE LIMITS FOR URINE DRUG SCREEN Drug Class                     Cutoff (ng/mL) Amphetamine and metabolites    1000 Barbiturate and metabolites    200 Benzodiazepine                 200 Opiates and metabolites        300 Cocaine and metabolites        300 THC                            50 Performed at [facility], [address] [place], [place] [address]   Urinalysis, Routine w reflex microscopic -Urine, Clean Catch     Status: None   Collection Time: 09/28/22  9:35 AM  Result Value Ref Range  Color, Urine YELLOW YELLOW   APPearance CLEAR CLEAR   Specific  Gravity, Urine 1.011 1.005 - 1.030   pH 5.0 5.0 - 8.0   Glucose, UA NEGATIVE NEGATIVE mg/dL   Hgb urine dipstick NEGATIVE NEGATIVE   Bilirubin Urine NEGATIVE NEGATIVE   Ketones, ur NEGATIVE NEGATIVE mg/dL   Protein, ur NEGATIVE NEGATIVE mg/dL   Nitrite NEGATIVE NEGATIVE   Leukocytes,Ua NEGATIVE NEGATIVE    Comment: Performed at [facility], [address], [place], [place] [address]  CBG monitoring, ED     Status: Abnormal   Collection Time: 09/28/22  9:35 AM  Result Value Ref Range   Glucose-Capillary 103 (H) 70 - 99 mg/dL    Comment: Glucose reference range applies only to samples taken after fasting for at least 8 hours.  CBG monitoring, ED     Status: None   Collection Time: 09/28/22 11:17 AM  Result Value Ref Range   Glucose-Capillary 84 70 - 99 mg/dL    Comment: Glucose reference range applies only to samples taken after fasting for at least 8 hours.   *Note: Due to a large number of results and/or encounters for the requested time period, some results have not been displayed. A complete set of results can be found in Results Review.   DG Chest 2 View  Result Date: 09/28/2022 CLINICAL DATA:  Syncope EXAM: CHEST - 2 VIEW COMPARISON:  07/30/2022 FINDINGS: Lungs are clear.  No pleural effusion or pneumothorax. The heart is top-normal in size. Visualized osseous structures are within normal limits. Surgical clips overlying the right neck. IMPRESSION: Normal chest radiographs. Electronically Signed   By: Charline Bills M.D.   On: 09/28/2022 00:11   CT Head Wo Contrast  Result Date: 09/27/2022 CLINICAL DATA:  Altered mental status EXAM: CT HEAD WITHOUT CONTRAST TECHNIQUE: Contiguous axial images were obtained from the base of the skull through the vertex without intravenous contrast. RADIATION DOSE REDUCTION: This exam was performed according to the departmental dose-optimization program which includes automated exposure control, adjustment of the mA and/or kV according to  patient size and/or use of iterative reconstruction technique. COMPARISON:  09/29/2020 FINDINGS: Brain: No evidence of acute infarction, hemorrhage, hydrocephalus, extra-axial collection or mass lesion/mass effect. Partially empty sella is noted stable from the prior exam. Vascular: No hyperdense vessel or unexpected calcification. Skull: Normal. Negative for fracture or focal lesion. Sinuses/Orbits: Mild fluid is noted within the left mastoid air cells. No other focal abnormality is noted. Other: None. IMPRESSION: Mild left mastoid effusion No acute intracranial abnormality noted. Electronically Signed   By: Alcide Clever M.D.   On: 09/27/2022 22:22    Pending Labs Unresulted Labs (From admission, onward)     Start     Ordered   09/28/22 0500  CBC with Differential/Platelet  Tomorrow morning,   R        09/28/22 0349            Vitals/Pain Today's Vitals   09/28/22 1030 09/28/22 1044 09/28/22 1100 09/28/22 1119  BP: (!) 117/53  113/88   Pulse: 79  79   Resp: 12  12   Temp:   98 F (36.7 C)   TempSrc:   Oral   SpO2: 98%  100%   Weight:      Height:      PainSc:  9   9     Isolation Precautions No active isolations  Medications Medications  0.9 %  sodium chloride infusion ( Intravenous New Bag/Given 09/28/22  1121)  heparin injection 5,000 Units (5,000 Units Subcutaneous Given 09/28/22 0618)  acetaminophen (TYLENOL) tablet 650 mg (650 mg Oral Given 09/28/22 1050)    Or  acetaminophen (TYLENOL) suppository 650 mg ( Rectal See Alternative 09/28/22 1050)  senna-docusate (Senokot-S) tablet 1 tablet (has no administration in time range)  aspirin EC tablet 81 mg (81 mg Oral Given 09/28/22 1041)  calcitRIOL (ROCALTROL) capsule 0.25 mcg (has no administration in time range)  famotidine (PEPCID) tablet 20 mg (has no administration in time range)  levothyroxine (SYNTHROID) tablet 200 mcg (200 mcg Oral Given 09/28/22 0616)  mirabegron ER (MYRBETRIQ) tablet 25 mg (25 mg Oral Given 09/28/22  1041)  rosuvastatin (CRESTOR) tablet 20 mg (20 mg Oral Given 09/28/22 1041)  valACYclovir (VALTREX) tablet 500 mg (500 mg Oral Given 09/28/22 1042)  venlafaxine XR (EFFEXOR-XR) 24 hr capsule 150 mg (150 mg Oral Given 09/28/22 1050)  Buprenorphine HCl FILM 750 mcg (750 mcg Oral Not Given 09/28/22 1032)  insulin NPH Human (NOVOLIN N) injection 25 Units (0 Units Subcutaneous Hold 09/28/22 0820)  insulin aspart (novoLOG) injection 0-15 Units ( Subcutaneous Not Given 09/28/22 1118)  sodium chloride 0.9 % bolus 1,000 mL (0 mLs Intravenous Stopped 09/28/22 0329)  naloxone (NARCAN) injection 0.4 mg (0.4 mg Intravenous Given 09/27/22 2259)    Mobility walks with person assist     Focused Assessments A/Ox4, chronic bilateral shoulder and neck pain.    R Recommendations: See Admitting Provider Note  Report given to:   Additional Notes: Pt is A/Ox4.

## 2022-09-28 NOTE — H&P (Addendum)
PCP:   Jamey Reas, PA-C   Chief Complaint:  LOC  HPI: This is a 67 year old female with PMHx of extreme morbid obesity, OSA on CPAP, HFpEF, chronic pain syndrome, CKD IIIb, HTN, hypothyroidism.  The patient has had multiple recent admissions 6/13-6/15, 6/4-6/7 with hypoxia related to OSA/OHS/HFpEF patient diuresed.she was readmitted with acute on chronic kidney injury.  Today the patient re-presents with altered mentation and loss of consciousness.  She has been off narcotics for approximately 3 months.  Her narcotics was refilled last Thursday.  On Thursday night she took her first dose of oxycodone.  She states she became really dizzy.  Her dizziness persisted.  Per patient she has taken no further oxycodone since, however, today when her daughter palliative cook her dinner.  She found her mother incoherent, nonsensical, slurring her words and sleepy.  Per patient she passed out and fell out of her wheelchair.  Her daughter called 911.  When paramedics arrived patient was given Narcan to which she was responsive.  The patient additionally endorses a cough for the past 2 days and shortness of breath which is new.  She denies fever, chills, nausea, vomiting or diarrhea.  In the ER patient's presenting BP 90/55, 76, 13, afebrile.  Creatinine 4.84, baseline creatinine 1.65 done 08/02/2022.  Creatinine 126.  Sodium 127 previous sodium 138.  Potassium 5.7, repeated for confirmation 5.1.  Bicarb 17.  WBC 13.1.  Salicylates <7.  Alcohol <10. CXR normal.  Patient saturating well on room air.  Patient given 1 L NS bolus and repeat dose of Narcan. During my interview, patient awake and able to provide a history.  Review of Systems:  Per HPI.  Past Medical History: Past Medical History:  Diagnosis Date   Anemia    Anxiety and depression    Asthma    Breast cancer (HCC) 02/13/2012   ruq  100'clock bx Ductal Carcinoma in Situ,(0/1) lymph node neg, treated with radiation   Chronic lower back pain     CKD (chronic kidney disease), stage III (HCC)    "lower stage" (01/06/2014)   Gait abnormality 05/29/2016   GERD (gastroesophageal reflux disease)    History of hiatal hernia    History of stomach ulcers    HSV (herpes simplex virus) infection    Hyperlipemia    Hypertension    sees Dr. 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   partial   benign cyst  Left    removed from left breast   BREAST BIOPSY Right 02/13/2012   BREAST LUMPECTOMY  03/03/2012   Procedure: LUMPECTOMY;  Surgeon: Currie Paris, MD;  Location: [facility] OR;  Service: General;  Laterality: Right;   BREAST SURGERY Right 01/2012   "cancer"   CHOLECYSTECTOMY  1980's?   COLONOSCOPY WITH PROPOFOL N/A 09/28/2012   Procedure: COLONOSCOPY WITH PROPOFOL;  Surgeon: Shirley Friar, MD;  Location: [facility];  Service: Endoscopy;  Laterality: N/A;   FOOT FRACTURE SURGERY Right 1990's   JOINT REPLACEMENT      x 3   LESION EXCISION N/A 03/13/2021  Procedure: EXCISION SCALP SEBACEOUS CYSTS x2;  Surgeon: Axel Filler, MD;  Location: [facility] OR;  Service: General;  Laterality: N/A;  60 MINUTES LOCAL & MAC   LUMBAR LAMINECTOMY/DECOMPRESSION MICRODISCECTOMY N/A 12/05/2016   Procedure: LEFT L5-S1 MICRODISCECTOMY;  Surgeon: Eldred Manges, MD;  Location: [facility] OR;  Service: Orthopedics;  Laterality: N/A;   REVISION TOTAL KNEE ARTHROPLASTY  07/2009   SHOULDER OPEN ROTATOR CUFF REPAIR Left 1990's   THYROIDECTOMY  1970's   TOTAL KNEE ARTHROPLASTY Bilateral 2009-06/2009   left; right    Medications: Prior to Admission medications   Medication Sig Start Date End Date Taking? Authorizing  Provider  albuterol (VENTOLIN HFA) 108 (90 Base) MCG/ACT inhaler Inhale 2 puffs into the lungs every 4 (four) hours as needed for wheezing or shortness of breath.     [provider]  amLODipine (NORVASC) 2.5 MG tablet Take 1 tablet (2.5 mg total) by mouth daily. 08/02/22   Berton Mount I, MD  aspirin EC 81 MG tablet Take 1 tablet (81 mg total) by mouth daily. 08/12/13   Viyuoh, Rolland Bimler, MD  BELBUCA 750 MCG FILM Take 750 mcg by mouth 2 (two) times daily. 07/10/22   [provider]  calcitRIOL (ROCALTROL) 0.25 MCG capsule Take 1 capsule (0.25 mcg total) by mouth 3 (three) times a week. 09/03/22   Carlus Pavlov, MD  Cholecalciferol (VITAMIN D-3) 1000 units CAPS Take 1,000 Units by mouth daily.    [provider]  cyclobenzaprine (FLEXERIL) 5 MG tablet Take 5 mg by mouth 3 (three) times daily as needed for muscle spasms. 07/10/22   [provider]  esomeprazole (NEXIUM) 40 MG capsule Take 1 capsule (40 mg total) by mouth daily before breakfast. 10/30/14   Vassie Loll, MD  famotidine (PEPCID) 40 MG tablet Take 40 mg by mouth daily after supper. 06/16/20   [provider]  ferrous sulfate 325 (65 FE) MG tablet Take 325 mg by mouth in the morning.    [provider]  furosemide (LASIX) 40 MG tablet Take 2 tablets (80 mg total) by mouth daily. 07/25/22   Zannie Cove, MD  glucose blood (ONETOUCH VERIO) test strip TEST BLOOD SUGAR THREE TIMES DAILY 03/12/22   Carlus Pavlov, MD  GNP ULTICARE PEN NEEDLES 32G X 4 MM MISC 1 Needle by Does not apply route 4 (four) times daily. 09/25/22   Carlus Pavlov, MD  Insulin NPH, Human,, Isophane, (HUMULIN N KWIKPEN) 100 UNIT/ML Kiwkpen Inject 25 Units into the skin every morning. And pen needles 1/day 07/25/22   Zannie Cove, MD  Lancets [facility] DELICA PLUS [place]) MISC 1 each by Other route 3 (three) times daily. Use to check blood sugar 3 times a day 05/23/22   Carlus Pavlov, MD  levothyroxine  (SYNTHROID) 200 MCG tablet Take 1 tablet (200 mcg total) by mouth daily before breakfast. 04/25/22   Carlus Pavlov, MD  linaclotide (LINZESS) 290 MCG CAPS capsule Take 290 mcg by mouth daily before breakfast.    [provider]  mirabegron ER (MYRBETRIQ) 25 MG TB24 tablet Take 25 mg by mouth in the morning.    [provider]  mupirocin ointment (BACTROBAN) 2 % Place 1 Application into the nose 2 (two) times daily. 08/02/22   Barnetta Chapel, MD  naloxone [facility]) nasal spray 4 mg/0.1 mL Place 1 spray into the nose as needed for opioid reversal. 08/01/20   [provider]  polyethylene glycol (MIRALAX / GLYCOLAX) 17 g packet Take 17 g by mouth daily as  needed for mild constipation. 08/02/22   Barnetta Chapel, MD  polyvinyl alcohol (LIQUIFILM TEARS) 1.4 % ophthalmic solution Place 1 drop into both eyes as needed for dry eyes.    [provider]  potassium chloride SA (KLOR-CON) 20 MEQ tablet Take 20 mEq by mouth daily. 06/22/20   [provider]  pregabalin (LYRICA) 300 MG capsule Take 300 mg by mouth 2 (two) times daily.    [provider]  rosuvastatin (CRESTOR) 20 MG tablet Take 20 mg by mouth daily.    [provider]  SURE COMFORT PEN NEEDLES 32G X 6 MM MISC USE TO INJECT INSULIN FOUR TIMES DAILY 03/13/21   Romero Belling, MD  valACYclovir (VALTREX) 1000 MG tablet Take 1,000 mg by mouth daily.    [provider]  venlafaxine XR (EFFEXOR-XR) 150 MG 24 hr capsule Take 150 mg by mouth 2 (two) times daily. 05/21/15   [provider]  ziprasidone (GEODON) 60 MG capsule Take 60 mg by mouth daily after supper.    [provider]    Allergies:   Allergies  Allergen Reactions   Bee Venom Anaphylaxis   Invokana [Canagliflozin] Anaphylaxis    Dyspnea and urinary retention   Shellfish Allergy Anaphylaxis   Buprenorphine Hcl Other (See Comments)    Overly sedated with morphine drip.   Other Other (See  Comments)    Cats - bad asthma, stopped up    Social History:  reports that she quit smoking about 30 years ago. Her smoking use included cigarettes. She started smoking about 37 years ago. She has a 7 pack-year smoking history. She has never used smokeless tobacco. She reports that she does not drink alcohol and does not use drugs.  Family History: Family History  Problem Relation Age of Onset   Heart disease Brother        Multiple MIs, starting in his 48s   Diabetes Brother    Thyroid disease Brother    Hypertension Mother    Diabetes Mother    Breast cancer Mother 88   Bone cancer Mother    Hypertension Sister    Diabetes Sister    Breast cancer Sister 50   Thyroid disease Sister    Breast cancer Maternal Grandmother    Heart disease Maternal Grandmother    Uterine cancer Other 19   Breast cancer Paternal Aunt 68   Breast cancer Paternal Grandmother        dx in her 58s   Prostate cancer Paternal Grandfather    Asthma Son     Physical Exam: Vitals:   09/27/22 2053 09/27/22 2055 09/27/22 2145 09/28/22 0115  BP:  (!) 90/57 (!) 99/54 112/63  Pulse:  76 73 79  Resp:  13 13 12   Temp:  97.9 F (36.6 C)    TempSrc:  Oral    SpO2:  94% 98% 99%  Weight: (!) 156.2 kg     Height: 5\' 5"  (1.651 m)       General: A and O x 3, morbidly obese, no acute distress Eyes: PERRLA, pink conjunctiva, no scleral icterus ENT: Moist oral mucosa, neck supple Lungs: CTA E/L, no wheeze, no crackles, no use of accessory muscles Cardiovascular: RRR, no murmurs. No carotid bruits. Abdomen: soft, positive BS, obese abdomen, NTND, not an acute abdomen GU: not examined Neuro: CN II - XII grossly intact, sensation intact Musculoskeletal: strength 5/5 all extremities.  Lymphedema Skin: no rash, no subcutaneous crepitation, no decubitus Psych: appropriate patient  Labs on Admission:  Recent Labs    09/27/22 2302 09/28/22 0117  NA 127*  --   K 5.7* 5.1  CL 91*  --   CO2 17*  --    GLUCOSE 117*  --   BUN 126*  --   CREATININE 4.84*  --   CALCIUM 9.2  --    Recent Labs    09/27/22 2302  AST 15  ALT 23  ALKPHOS 62  BILITOT 0.4  PROT 6.6  ALBUMIN 3.9    Recent Labs    09/27/22 2302  WBC 13.1*  NEUTROABS 11.3*  HGB 9.6*  HCT 29.8*  MCV 82.8  PLT 178     Radiological Exams on Admission: DG Chest 2 View  Result Date: 09/28/2022 CLINICAL DATA:  Syncope EXAM: CHEST - 2 VIEW COMPARISON:  07/30/2022 FINDINGS: Lungs are clear.  No pleural effusion or pneumothorax. The heart is top-normal in size. Visualized osseous structures are within normal limits. Surgical clips overlying the right neck. IMPRESSION: Normal chest radiographs. Electronically Signed   By: Charline Bills M.D.   On: 09/28/2022 00:11   CT Head Wo Contrast  Result Date: 09/27/2022 CLINICAL DATA:  Altered mental status EXAM: CT HEAD WITHOUT CONTRAST TECHNIQUE: Contiguous axial images were obtained from the base of the skull through the vertex without intravenous contrast. RADIATION DOSE REDUCTION: This exam was performed according to the departmental dose-optimization program which includes automated exposure control, adjustment of the mA and/or kV according to patient size and/or use of iterative reconstruction technique. COMPARISON:  09/29/2020 FINDINGS: Brain: No evidence of acute infarction, hemorrhage, hydrocephalus, extra-axial collection or mass lesion/mass effect. Partially empty sella is noted stable from the prior exam. Vascular: No hyperdense vessel or unexpected calcification. Skull: Normal. Negative for fracture or focal lesion. Sinuses/Orbits: Mild fluid is noted within the left mastoid air cells. No other focal abnormality is noted. Other: None. IMPRESSION: Mild left mastoid effusion No acute intracranial abnormality noted. Electronically Signed   By: Alcide Clever M.D.   On: 09/27/2022 22:22    Assessment/Plan Present on Admission:  Acute kidney injury superimposed on chronic kidney  disease (HCC) dehydrated -IV fluid hydration, strict I's and O's -Elevated BUN 126.  Likely due to dehydration -Avoid nephrotoxic medication.   -Hold potassium.  Lasix on hold -BMP in a.m. -Narcan as needed -Hypotension be treated with IV fluids   Unintentional overdose -DC oxycodone.  Monitor -Continue as needed Narcan   Hyperkalemia -Erroneous, likely hemolysis.  Repeat potassium 5.1   Chronic pain -Lyrica and Flexeril on hold -Belbuca resumed in AM   T2DM //  Diabetic polyneuropathy (HCC) -Sliding scale insulin.  Home insulin resumed   Chronic diastolic CHF (congestive heart failure) (HCC) -Hydrating patient.  Patient with history of CHF.  Monitor closely -Lasix on hold.  EF 65 to 70 %   HLD (hyperlipidemia) -Crestor resumed   Hyperparathyroidism, secondary (HCC) -Rocaltrol resumed   Hypothyroidism -Synthroid resumed   Schizophrenia, paranoid (HCC) -Geodon on hold  ,  09/28/2022, 2:06 AM

## 2022-09-29 DIAGNOSIS — T50901A Poisoning by unspecified drugs, medicaments and biological substances, accidental (unintentional), initial encounter: Secondary | ICD-10-CM | POA: Diagnosis not present

## 2022-09-29 LAB — GLUCOSE, CAPILLARY
Glucose-Capillary: 77 mg/dL (ref 70–99)
Glucose-Capillary: 130 mg/dL — ABNORMAL HIGH (ref 70–99)
Glucose-Capillary: 102 mg/dL — ABNORMAL HIGH (ref 70–99)
Glucose-Capillary: 108 mg/dL — ABNORMAL HIGH (ref 70–99)

## 2022-09-29 MED ORDER — INSULIN ASPART 100 UNIT/ML IJ SOLN
0.0000 [IU] | Freq: Three times a day (TID) | INTRAMUSCULAR | Status: DC
  Administered 2022-09-29: 1 [IU] via SUBCUTANEOUS

## 2022-09-29 MED ORDER — INSULIN NPH (HUMAN) (ISOPHANE) 100 UNIT/ML ~~LOC~~ SUSP
18.0000 [IU] | Freq: Every day | SUBCUTANEOUS | Status: DC
  Administered 2022-09-30: 18 [IU] via SUBCUTANEOUS
  Filled 2022-09-29: qty 10

## 2022-09-29 MED ORDER — INSULIN ASPART 100 UNIT/ML IJ SOLN: 0.0000 [IU] | Freq: Every day | INTRAMUSCULAR | Status: DC

## 2022-09-29 NOTE — Plan of Care (Signed)
Problem: Education: Goal: Ability to describe self-care measures that may prevent or decrease complications (Diabetes Survival Skills Education) will improve Outcome: Progressing Goal: Individualized Educational Video(s) Outcome: Progressing   Problem: Coping: Goal: Ability to adjust to condition or change in health will improve Outcome: Progressing   Problem: Fluid Volume: Goal: Ability to maintain a balanced intake and output will improve Outcome: Progressing   Problem: Health Behavior/Discharge Planning: Goal: Ability to identify and utilize available resources and services will improve Outcome: Progressing Goal: Ability to manage health-related needs will improve Outcome: Progressing   Problem: Metabolic: Goal: Ability to maintain appropriate glucose levels will improve Outcome: Progressing   Problem: Nutritional: Goal: Maintenance of adequate nutrition will improve Outcome: Progressing Goal: Progress toward achieving an optimal weight will improve Outcome: Progressing   Problem: Skin Integrity: Goal: Risk for impaired skin integrity will decrease Outcome: Progressing   Problem: Tissue Perfusion: Goal: Adequacy of tissue perfusion will improve Outcome: Progressing   Problem: Education: Goal: Knowledge of General Education information will improve Description: Including pain rating scale, medication(s)/side effects and non-pharmacologic comfort measures Outcome: Progressing   Problem: Health Behavior/Discharge Planning: Goal: Ability to manage health-related needs will improve Outcome: Progressing   Problem: Clinical Measurements: Goal: Ability to maintain clinical measurements within normal limits will improve Outcome: Progressing Goal: Will remain free from infection Outcome: Progressing Goal: Diagnostic test results will improve Outcome: Progressing Goal: Respiratory complications will improve Outcome: Progressing Goal: Cardiovascular complication will  be avoided Outcome: Progressing   Problem: Activity: Goal: Risk for activity intolerance will decrease Outcome: Progressing   Problem: Nutrition: Goal: Adequate nutrition will be maintained Outcome: Progressing   Problem: Coping: Goal: Level of anxiety will decrease Outcome: Progressing   Problem: Elimination: Goal: Will not experience complications related to bowel motility Outcome: Progressing Goal: Will not experience complications related to urinary retention Outcome: Progressing   Problem: Pain Managment: Goal: General experience of comfort will improve Outcome: Progressing   Problem: Safety: Goal: Ability to remain free from injury will improve Outcome: Progressing   Problem: Skin Integrity: Goal: Risk for impaired skin integrity will decrease Outcome: Progressing   Problem: Education: Goal: Ability to describe self-care measures that may prevent or decrease complications (Diabetes Survival Skills Education) will improve Outcome: Progressing   Problem: Education: Goal: Individualized Educational Video(s) Outcome: Progressing   Problem: Coping: Goal: Ability to adjust to condition or change in health will improve Outcome: Progressing   Problem: Health Behavior/Discharge Planning: Goal: Ability to identify and utilize available resources and services will improve Outcome: Progressing   Problem: Metabolic: Goal: Ability to maintain appropriate glucose levels will improve Outcome: Progressing   Problem: Nutritional: Goal: Maintenance of adequate nutrition will improve Outcome: Progressing   Problem: Nutritional: Goal: Maintenance of adequate nutrition will improve Outcome: Progressing   Problem: Nutritional: Goal: Progress toward achieving an optimal weight will improve Outcome: Progressing   Problem: Skin Integrity: Goal: Risk for impaired skin integrity will decrease Outcome: Progressing   Problem: Tissue Perfusion: Goal: Adequacy of tissue  perfusion will improve Outcome: Progressing   Problem: Education: Goal: Knowledge of General Education information will improve Description: Including pain rating scale, medication(s)/side effects and non-pharmacologic comfort measures Outcome: Progressing   Problem: Health Behavior/Discharge Planning: Goal: Ability to manage health-related needs will improve Outcome: Progressing   Problem: Clinical Measurements: Goal: Ability to maintain clinical measurements within normal limits will improve Outcome: Progressing   Problem: Clinical Measurements: Goal: Will remain free from infection Outcome: Progressing   Problem: Clinical Measurements: Goal: Diagnostic test results will  improve Outcome: Progressing   Problem: Activity: Goal: Risk for activity intolerance will decrease Outcome: Progressing   Problem: Nutrition: Goal: Adequate nutrition will be maintained Outcome: Progressing   Problem: Coping: Goal: Level of anxiety will decrease Outcome: Progressing   Problem: Pain Managment: Goal: General experience of comfort will improve Outcome: Progressing   Problem: Elimination: Goal: Will not experience complications related to urinary retention Outcome: Progressing   Problem: Elimination: Goal: Will not experience complications related to bowel motility Outcome: Progressing   Problem: Safety: Goal: Ability to remain free from injury will improve Outcome: Progressing   Problem: Skin Integrity: Goal: Risk for impaired skin integrity will decrease Outcome: Progressing

## 2022-09-29 NOTE — Progress Notes (Signed)
PROGRESS NOTE                                                                                                                                                                                                             Patient Demographics:    [name], is a 67 y.o. female, DOB - 01-06-1956, VHQ:[id number]  Outpatient Primary MD for the patient is Ayriel, Kovalsky, PA-C    LOS - 1  Admit date - 09/27/2022    Chief Complaint  Patient presents with   Drug Overdose       Brief Narrative (HPI from H&P)  67 year old female with PMHx of extreme morbid obesity, OSA on CPAP, HFpEF, chronic pain syndrome, CKD IIIb, HTN, hypothyroidism.  The patient has had multiple recent admissions 6/13-6/15, 6/4-6/7 with hypoxia related to OSA/OHS/HFpEF patient diuresed.she was readmitted with acute on chronic kidney injury.  Today the patient re-presents with altered mentation and loss of consciousness.  In the ER she was found to be dehydrated, hypotensive, and AKI and admitted to the hospital.  Apparently she responded to Narcan in the ER however claims does not take narcotics.   Subjective:    [name] today has, No headache, No chest pain, No abdominal pain - No Nausea, No new weakness tingling or numbness, no SOB   Assessment  & Plan :   Dehydration, hypotension, AKI caused by combination of diuretics and ARB, offending medications held, being hydrated, dehydration improving, AKI improving, blood pressure has improved.  Continue to monitor on IV fluids.  Unintentional narcotic overdose.  Discontinue oxycodone.  Responded to Narcan.  Monitor.  AKI and hyperkalemia.  Improving with hydration, offending medications held as a #1 above.  Diastolic CHF.  EF 65%.  Currently dehydrated.  Diuretics held.    Chronic pain.  Continue Lyrica and Flexeril, hold narcotics.  Chronic weakness, deconditioning, wheelchair/bedbound status.   Supportive care.  PT OT.  Schizophrenia paranoid.  Resume home medication Geodon  Hypothyroidism.  On Synthroid.  Secondary and chronic hyperparathyroidism.  Resume Rocaltrol.  Morbid obesity with OSA.  Supportive care.  Nighttime CPAP.    GERD.  PPI.    Dyslipidemia.  On statin.    DM type II.  Sliding scale insulin.  Lab Results  Component Value Date   HGBA1C 6.0 (A) 04/24/2022   CBG (last 3)  Recent Labs    09/28/22 1554 09/28/22 2152 09/29/22 0750  GLUCAP 90 187* 77          Condition - Fair  Family Communication  : None present  Code Status :  Full  Consults  :  None  PUD Prophylaxis : PPI   Procedures  :     CT head.  Mild left mastoid effusion.      Disposition Plan  :    Status is: Inpatient  DVT Prophylaxis  :    heparin injection 5,000 Units Start: 09/28/22 0600    Lab Results  Component Value Date   PLT 171 09/28/2022    Diet :  Diet Order             Diet heart healthy/carb modified Room service appropriate? Yes; Fluid consistency: Thin  Diet effective now                    Inpatient Medications  Scheduled Meds:  acetaminophen  1,000 mg Oral BID   aspirin EC  81 mg Oral Daily   Buprenorphine HCl  750 mcg Oral BID   calcitRIOL  0.25 mcg Oral Once per day on Monday Wednesday Friday   cyclobenzaprine  5 mg Oral Daily   desmopressin  200 mcg Oral QHS   doxepin  50 mg Oral QHS   famotidine  20 mg Oral QPC supper   ferrous sulfate  325 mg Oral q AM   heparin  5,000 Units Subcutaneous Q8H   insulin aspart  0-5 Units Subcutaneous QHS   insulin aspart  0-9 Units Subcutaneous TID WC   [START ON 09/30/2022] insulin NPH Human  18 Units Subcutaneous QAC breakfast   levothyroxine  200 mcg Oral Q0600   mirabegron ER  25 mg Oral Daily   pantoprazole  40 mg Oral Q1200   rosuvastatin  20 mg Oral Daily   sucralfate  1 g Oral Daily   valACYclovir  500 mg Oral Daily   venlafaxine XR  150 mg Oral BID   ziprasidone  60 mg Oral  QPC supper   Continuous Infusions:  sodium chloride 100 mL/hr at 09/28/22 1722   PRN Meds:.acetaminophen **OR** acetaminophen, senna-docusate  Antibiotics  :    Anti-infectives (From admission, onward)    Start     Dose/Rate Route Frequency Ordered Stop   09/28/22 1000  valACYclovir (VALTREX) tablet 500 mg        500 mg Oral Daily 09/28/22 0412           Objective:   Vitals:   09/28/22 1551 09/29/22 0015 09/29/22 0341 09/29/22 0747  BP: (!) 133/113 (!) 135/118  (!) 113/59  Pulse: 83 77 81 83  Resp: 14 14 10 11   Temp: 98.1 F (36.7 C) 98 F (36.7 C) 98.2 F (36.8 C) 98.4 F (36.9 C)  TempSrc: Oral Oral Oral Oral  SpO2: 95% 96% 98% 97%  Weight:      Height:        Wt Readings from Last 3 Encounters:  09/27/22 (!) 156.2 kg  07/31/22 (!) 156.2 kg  07/22/22 (!) 159 kg     Intake/Output Summary (Last 24 hours) at 09/29/2022 1138 Last data filed at 09/29/2022 0751 Gross per 24 hour  Intake 446.59 ml  Output 2200 ml  Net -1753.41 ml     Physical Exam  Awake Alert, No new F.N deficits, Normal affect [name].AT,PERRAL Supple Neck, No JVD,   Symmetrical Chest wall movement, Good  air movement bilaterally, CTAB RRR,No Gallops,Rubs or new Murmurs,  +ve B.Sounds, Abd Soft, No tenderness,   No Cyanosis, Clubbing or edema        Data Review:    Recent Labs  Lab 09/27/22 2302 09/28/22 0451  WBC 13.1* 10.3  HGB 9.6* 9.0*  HCT 29.8* 27.9*  PLT 178 171  MCV 82.8 82.1  MCH 26.7 26.5  MCHC 32.2 32.3  RDW 17.3* 17.2*  LYMPHSABS 1.0 1.5  MONOABS 0.6 0.5  EOSABS 0.2 0.3  BASOSABS 0.0 0.1    Recent Labs  Lab 09/27/22 2302 09/28/22 0117 09/28/22 0405 09/29/22 0236  NA 127*  --  129* 134*  K 5.7* 5.1 5.2* 4.6  CL 91*  --  97* 102  CO2 17*  --  15* 18*  ANIONGAP 19*  --  17* 14  GLUCOSE 117*  --  102* 87  BUN 126*  --  124* 93*  CREATININE 4.84*  --  4.34* 2.73*  AST 15  --   --   --   ALT 23  --   --   --   ALKPHOS 62  --   --   --   BILITOT 0.4  --    --   --   ALBUMIN 3.9  --   --   --   MG  --   --  2.6*  --   CALCIUM 9.2  --  8.6* 8.8*      Recent Labs  Lab 09/27/22 2302 09/28/22 0405 09/29/22 0236  MG  --  2.6*  --   CALCIUM 9.2 8.6* 8.8*      Radiology Reports DG Chest 2 View  Result Date: 09/28/2022 CLINICAL DATA:  Syncope EXAM: CHEST - 2 VIEW COMPARISON:  07/30/2022 FINDINGS: Lungs are clear.  No pleural effusion or pneumothorax. The heart is top-normal in size. Visualized osseous structures are within normal limits. Surgical clips overlying the right neck. IMPRESSION: Normal chest radiographs. Electronically Signed   By: Charline Bills M.D.   On: 09/28/2022 00:11   CT Head Wo Contrast  Result Date: 09/27/2022 CLINICAL DATA:  Altered mental status EXAM: CT HEAD WITHOUT CONTRAST TECHNIQUE: Contiguous axial images were obtained from the base of the skull through the vertex without intravenous contrast. RADIATION DOSE REDUCTION: This exam was performed according to the departmental dose-optimization program which includes automated exposure control, adjustment of the mA and/or kV according to patient size and/or use of iterative reconstruction technique. COMPARISON:  09/29/2020 FINDINGS: Brain: No evidence of acute infarction, hemorrhage, hydrocephalus, extra-axial collection or mass lesion/mass effect. Partially empty sella is noted stable from the prior exam. Vascular: No hyperdense vessel or unexpected calcification. Skull: Normal. Negative for fracture or focal lesion. Sinuses/Orbits: Mild fluid is noted within the left mastoid air cells. No other focal abnormality is noted. Other: None. IMPRESSION: Mild left mastoid effusion No acute intracranial abnormality noted. Electronically Signed   By: Alcide Clever M.D.   On: 09/27/2022 22:22      Signature  -   Susa Raring M.D on 09/29/2022 at 11:38 AM   -  To page go to [email or web address]

## 2022-09-29 NOTE — Plan of Care (Signed)
  Problem: Education: Goal: Ability to describe self-care measures that may prevent or decrease complications (Diabetes Survival Skills Education) will improve Outcome: Progressing   Problem: Coping: Goal: Ability to adjust to condition or change in health will improve Outcome: Progressing   Problem: Fluid Volume: Goal: Ability to maintain a balanced intake and output will improve Outcome: Progressing   Problem: Health Behavior/Discharge Planning: Goal: Ability to identify and utilize available resources and services will improve Outcome: Progressing   Problem: Metabolic: Goal: Ability to maintain appropriate glucose levels will improve Outcome: Progressing   Problem: Nutritional: Goal: Maintenance of adequate nutrition will improve Outcome: Progressing Goal: Progress toward achieving an optimal weight will improve Outcome: Progressing   Problem: Tissue Perfusion: Goal: Adequacy of tissue perfusion will improve Outcome: Progressing   Problem: Education: Goal: Knowledge of General Education information will improve Description: Including pain rating scale, medication(s)/side effects and non-pharmacologic comfort measures Outcome: Progressing   Problem: Health Behavior/Discharge Planning: Goal: Ability to manage health-related needs will improve Outcome: Progressing

## 2022-09-29 NOTE — Progress Notes (Signed)
   09/29/22 2237  BiPAP/CPAP/SIPAP  Reason BIPAP/CPAP not in use Non-compliant   Pt refusing bipap at this time

## 2022-09-29 NOTE — Evaluation (Signed)
Physical Therapy Evaluation Patient Details Name: [name] MRN: [id number] DOB: [birth year] Today's Date: 09/29/2022  History of Present Illness  67 year old female admitted 8/10 with Dehydration, hypotension, AKI caused by combination of diuretics and ARB; unintentional overdose.  PMHx of extreme morbid obesity, OSA on CPAP, HFpEF, chronic pain syndrome, CKD IIIb, HTN, hypothyroidism.  The patient has had multiple recent admissions 6/13-6/15, 6/4-6/7 with hypoxia related to OSA/OHS/HFpEF.  Clinical Impression  Pt admitted with above diagnosis. Able to transfer herself to power w/c at home PTA. Aides assist with daily bath/dressing. Currently up to mod assist to scoot along bed. Was unable to safely perform lateral scoot to drop arm chair with 1 person assist today (similar to home set-up.) Patient will benefit from continued inpatient follow up therapy, <3 hours/day. Pt currently with functional limitations due to the deficits listed below (see PT Problem List). Pt will benefit from acute skilled PT to increase their independence and safety with mobility to allow discharge.           If plan is discharge home, recommend the following: Two people to help with walking and/or transfers;A lot of help with bathing/dressing/bathroom;Assistance with cooking/housework;Assist for transportation   Can travel by private vehicle   No    Equipment Recommendations None recommended by PT  Recommendations for Other Services       Functional Status Assessment Patient has had a recent decline in their functional status and demonstrates the ability to make significant improvements in function in a reasonable and predictable amount of time.     Precautions / Restrictions Precautions Precautions: Fall Restrictions Weight Bearing Restrictions: No      Mobility  Bed Mobility Overal bed mobility: Needs Assistance Bed Mobility: Supine to Sit, Sit to Supine     Supine to sit: Min assist, Used  rails Sit to supine: Min assist, Used rails   General bed mobility comments: Min assist for pt to pull herself into seated position with rail and therapist hand for support. Requires considerable time to move. LEs also support intially. Requires LE support to lift back into bed.    Transfers Overall transfer level: Needs assistance Equipment used: None               General transfer comment: Performed lateral scoot along bed with mod assist. Attempted lateral scoot to recliner (similar set-up to transfer technique at home,) however pt in too much pain and not following commands safely to scoot sidways (keeps scooting too far forward.)    Ambulation/Gait               General Gait Details: Uses pwr w/c at baseline.  Stairs            Wheelchair Mobility     Tilt Bed    Modified Rankin (Stroke Patients Only)       Balance Overall balance assessment: Needs assistance Sitting-balance support: No upper extremity supported, Feet supported Sitting balance-Leahy Scale: Fair Sitting balance - Comments: Sits EOB without UE support.                                     Pertinent Vitals/Pain Pain Assessment Pain Assessment: No/denies pain    Home Living Family/patient expects to be discharged to:: Private residence Living Arrangements: Alone Available Help at Discharge: Personal care attendant (has 2 PCAs that come by daily between the 2. Nephew also assists PRN) Type of Home:  House Home Access: Ramped entrance       Home Layout: One level Home Equipment: Grab bars - toilet;Toilet riser;Shower seat;Wheelchair - power;Other (comment) (Hoyer lift)      Prior Function Prior Level of Function : Needs assist             Mobility Comments: scoot and squat pivot to and from power w/c. Reports falls at home. One fall in hospital ADLs Comments: Needs assist with bathing/dressing     Extremity/Trunk Assessment   Upper Extremity  Assessment Upper Extremity Assessment: Defer to OT evaluation    Lower Extremity Assessment Lower Extremity Assessment: Generalized weakness       Communication   Communication Communication: No apparent difficulties Cueing Techniques: Verbal cues  Cognition Arousal: Alert Behavior During Therapy: Flat affect, Lability Overall Cognitive Status: No family/caregiver present to determine baseline cognitive functioning                                 General Comments: Oriented x4 but delayed processing, not following commands for safe mobility consistently.        General Comments General comments (skin integrity, edema, etc.): VSS on RA during assessment.    Exercises     Assessment/Plan    PT Assessment Patient needs continued PT services  PT Problem List Decreased range of motion;Decreased strength;Decreased activity tolerance;Decreased balance;Decreased mobility;Decreased cognition;Decreased knowledge of use of DME;Obesity;Impaired sensation       PT Treatment Interventions DME instruction;Functional mobility training;Therapeutic activities;Therapeutic exercise;Balance training;Neuromuscular re-education;Cognitive remediation;Patient/family education    PT Goals (Current goals can be found in the Care Plan section)  Acute Rehab PT Goals Patient Stated Goal: Go home PT Goal Formulation: With patient Time For Goal Achievement: 10/13/22 Potential to Achieve Goals: Fair    Frequency Min 1X/week     Co-evaluation               AM-PAC PT "6 Clicks" Mobility  Outcome Measure Help needed turning from your back to your side while in a flat bed without using bedrails?: A Little Help needed moving from lying on your back to sitting on the side of a flat bed without using bedrails?: A Little Help needed moving to and from a bed to a chair (including a wheelchair)?: Total Help needed standing up from a chair using your arms (e.g., wheelchair or bedside  chair)?: Total Help needed to walk in hospital room?: Total Help needed climbing 3-5 steps with a railing? : Total 6 Click Score: 10    End of Session   Activity Tolerance: Patient limited by fatigue;Patient limited by pain Patient left: in bed;with call bell/phone within reach;with bed alarm set Nurse Communication: Mobility status;Need for lift equipment PT Visit Diagnosis: Muscle weakness (generalized) (M62.81);History of falling (Z91.81);Difficulty in walking, not elsewhere classified (R26.2)    Time: 9528-4132 PT Time Calculation (min) (ACUTE ONLY): 33 min   Charges:   PT Evaluation $PT Eval Moderate Complexity: 1 Mod PT Treatments $Therapeutic Activity: 8-22 mins PT General Charges $$ ACUTE PT VISIT: 1 Visit         Kathlyn Sacramento, PT, DPT [facility]  Rehabilitation Services Physical Therapist Office: [phone] Website: [email or web address]   Berton Mount 09/29/2022, 3:55 PM

## 2022-09-30 ENCOUNTER — Other Ambulatory Visit (HOSPITAL_COMMUNITY): Payer: Self-pay

## 2022-09-30 DIAGNOSIS — T50901A Poisoning by unspecified drugs, medicaments and biological substances, accidental (unintentional), initial encounter: Secondary | ICD-10-CM | POA: Diagnosis not present

## 2022-09-30 LAB — GLUCOSE, CAPILLARY
Glucose-Capillary: 79 mg/dL (ref 70–99)
Glucose-Capillary: 109 mg/dL — ABNORMAL HIGH (ref 70–99)

## 2022-09-30 MED ORDER — TORSEMIDE 10 MG PO TABS
10.0000 mg | ORAL_TABLET | Freq: Every day | ORAL | 0 refills | Status: DC
  Filled 2022-09-30: qty 30, 30d supply, fill #0

## 2022-09-30 MED ORDER — POTASSIUM CHLORIDE CRYS ER 10 MEQ PO TBCR
10.0000 meq | EXTENDED_RELEASE_TABLET | Freq: Every day | ORAL | 0 refills | Status: DC
  Filled 2022-09-30: qty 30, 30d supply, fill #0

## 2022-09-30 MED ORDER — AMLODIPINE BESYLATE 5 MG PO TABS
5.0000 mg | ORAL_TABLET | Freq: Every day | ORAL | 0 refills | Status: DC
  Filled 2022-09-30: qty 30, 30d supply, fill #0

## 2022-09-30 NOTE — Discharge Instructions (Signed)
Follow with Primary MD Jamey Reas, PA-C in 7 days   Get CBC, CMP, Magnesium, 2 view Chest X ray -  checked next visit with your primary MD   Activity: As tolerated with Full fall precautions use walker/cane & assistance as needed  Disposition Home   Diet: Heart Healthy low carbohydrate diet, check CBGs q. ACH S.    Check your Weight same time everyday, if you gain over 2 pounds, or you develop in leg swelling, experience more shortness of breath or chest pain, call your Primary MD immediately. Follow Cardiac Low Salt Diet and 1.5 lit/day fluid restriction.  Special Instructions: If you have smoked or chewed Tobacco  in the last 2 yrs please stop smoking, stop any regular Alcohol  and or any Recreational drug use.  On your next visit with your primary care physician please Get Medicines reviewed and adjusted.  Please request your Prim.MD to go over all Hospital Tests and Procedure/Radiological results at the follow up, please get all Hospital records sent to your Prim MD by signing hospital release before you go home.  If you experience worsening of your admission symptoms, develop shortness of breath, life threatening emergency, suicidal or homicidal thoughts you must seek medical attention immediately by calling 911 or calling your MD immediately  if symptoms less severe.  You Must read complete instructions/literature along with all the possible adverse reactions/side effects for all the Medicines you take and that have been prescribed to you. Take any new Medicines after you have completely understood and accpet all the possible adverse reactions/side effects.

## 2022-09-30 NOTE — TOC Transition Note (Signed)
Transition of Care [facility]) - CM/SW Discharge Note   Patient Details  Name: [name] MRN: [id number] Date of Birth: 04-15-1955  Transition of Care [facility]) CM/SW Contact:  Gordy Clement, RN Phone Number: 09/30/2022, 11:22 AM   Clinical Narrative:     Patient will dc to home today via [facility]. She is non ambulatory- bedbound.  Patient Receives PCS Aide from [employer]. Arlys John [phone]) is her weekly Aide for 2 1/2- 3 hours Mon-Friday .  There are various other Aide's that help on Saturday and Sunday.  Patient is also current with [facility] for RN/PT and OT.  They are aware they need to resume services.  Patient states that a RN from [facility] also visits every few weeks and patient can call her if she needs anything.   [facility] will transport and Sue Lush with [facility] will meet at the house- She will need a call when [facility] picks up patient here. [phone]  Patient states she has all equipment needed            Patient Goals and CMS Choice      Discharge Placement                         Discharge Plan and Services Additional resources added to the After Visit Summary for                                       Social Determinants of Health (SDOH) Interventions SDOH Screenings   Food Insecurity: No Food Insecurity (09/28/2022)  Housing: Low Risk  (09/28/2022)  Transportation Needs: No Transportation Needs (09/28/2022)  Utilities: Not At Risk (09/28/2022)  Tobacco Use: Medium Risk (07/30/2022)     Readmission Risk Interventions    07/25/2022    2:28 PM  Readmission Risk Prevention Plan  Transportation Screening Complete  Medication Review (RN Care Manager) Referral to Pharmacy  PCP or Specialist appointment within 3-5 days of discharge Complete  HRI or Home Care Consult Complete  SW Recovery Care/Counseling Consult Complete  Palliative Care Screening Not Applicable  Skilled Nursing Facility Patient Refused

## 2022-09-30 NOTE — Evaluation (Signed)
Occupational Therapy Evaluation Patient Details Name: [name] MRN: [id number] DOB: 02-Jun-1955 Today's Date: 09/30/2022   History of Present Illness 67 year old female admitted 8/10 with Dehydration, hypotension, AKI caused by combination of diuretics and ARB; unintentional overdose.  PMHx of extreme morbid obesity, OSA on CPAP, HFpEF, chronic pain syndrome, CKD IIIb, HTN, hypothyroidism.  The patient has had multiple recent admissions 6/13-6/15, 6/4-6/7 with hypoxia related to OSA/OHS/HFpEF.   Clinical Impression   Pt admitted for above, has assist with ADLs at baseline and reports being w/c bound typically. Pt declined any mobility today but presented with generalized weakness in BLE/BUEs and decreased LUE ROM from a rotator injury. Pt notes pain in RLE with mobility which prompted her to decline anything. She can perform facial grooming with set up assist while bed level but needs Total A for LB ADLs. Pt would benefit from acute skilled OT services to address functional deficits and help transition to next level of care. Patient would benefit from post acute skilled rehab facility with <3 hours of therapy and 24/7 support        If plan is discharge home, recommend the following: A lot of help with bathing/dressing/bathroom;Two people to help with walking and/or transfers;Assistance with cooking/housework    Functional Status Assessment  Patient has had a recent decline in their functional status and demonstrates the ability to make significant improvements in function in a reasonable and predictable amount of time.  Equipment Recommendations  None recommended by OT (defer to next level of care)    Recommendations for Other Services       Precautions / Restrictions Precautions Precautions: Fall Restrictions Weight Bearing Restrictions: No      Mobility Bed Mobility                    Transfers                          Balance       Sitting  balance - Comments: Pt declined                                   ADL either performed or assessed with clinical judgement   ADL Overall ADL's : Needs assistance/impaired Eating/Feeding: Independent;Bed level   Grooming: Bed level;Wash/dry face;Set up   Upper Body Bathing: Bed level;Moderate assistance   Lower Body Bathing: Bed level;Total assistance   Upper Body Dressing : Bed level;Moderate assistance   Lower Body Dressing: Bed level;Total assistance     Toilet Transfer Details (indicate cue type and reason): NT   Toileting - Clothing Manipulation Details (indicate cue type and reason): NT   Tub/Shower Transfer Details (indicate cue type and reason): NT   General ADL Comments: Pt declined mobility to EOB despite efforts of encouragement, Pt reports she has too much pain with mobility and "a lot going on" Her RLE is very uncomfortable and per her report moves out of place with efforts to move it.     Vision         Perception         Praxis         Pertinent Vitals/Pain Pain Assessment Pain Assessment: Faces Faces Pain Scale: Hurts even more Pain Location: LUE with mobility Pain Descriptors / Indicators: Aching, Sore, Discomfort, Grimacing Pain Intervention(s): Repositioned, Monitored during session     Extremity/Trunk Assessment Upper Extremity Assessment  Upper Extremity Assessment: Generalized weakness (L rotator injury, limited to 25 degrees shoulder flexion. Limiited by pain, not fully assessed)   Lower Extremity Assessment Lower Extremity Assessment: Generalized weakness       Communication Communication Communication: No apparent difficulties   Cognition Arousal: Alert Behavior During Therapy: Flat affect Overall Cognitive Status: Within Functional Limits for tasks assessed                                 General Comments: Pt seems solemn and down in spirits     General Comments  Pt refused mobility, states she  receives [facility] services that help with rotator injuries as well    Exercises     Shoulder Instructions      Home Living Family/patient expects to be discharged to:: Private residence Living Arrangements: Alone;Other (Comment) Available Help at Discharge: Personal care attendant (has 2 PCAs that come by daily between the 2. Nephew also assists PRN) Type of Home: House Home Access: Ramped entrance     Home Layout: One level     Bathroom Shower/Tub: Producer, television/film/video: Standard Bathroom Accessibility: Yes   Home Equipment: Grab bars - toilet;Toilet riser;Shower seat;Wheelchair - power;Other (comment) (Hoyer lift)          Prior Functioning/Environment Prior Level of Function : Needs assist             Mobility Comments: scoot and squat pivot to and from power w/c. Reports falls at home. One fall in hospital ADLs Comments: Needs assist with bathing/dressing        OT Problem List: Decreased strength      OT Treatment/Interventions: Self-care/ADL training;Balance training;Therapeutic activities;Therapeutic exercise;Patient/family education    OT Goals(Current goals can be found in the care plan section) Acute Rehab OT Goals Patient Stated Goal: TO get out of hospital OT Goal Formulation: With patient Time For Goal Achievement: 10/14/22 ADL Goals Pt Will Perform Grooming: with set-up;bed level Pt Will Perform Upper Body Bathing: bed level;with set-up Pt Will Perform Upper Body Dressing: with set-up;bed level Pt/caregiver will Perform Home Exercise Program: With Supervision;Both right and left upper extremity;With theraband;With written HEP provided  OT Frequency: Min 1X/week    Co-evaluation              AM-PAC OT "6 Clicks" Daily Activity     Outcome Measure Help from another person eating meals?: None Help from another person taking care of personal grooming?: A Little Help from another person toileting, which includes using toliet, bedpan,  or urinal?: A Lot Help from another person bathing (including washing, rinsing, drying)?: A Lot Help from another person to put on and taking off regular upper body clothing?: A Lot Help from another person to put on and taking off regular lower body clothing?: Total 6 Click Score: 14   End of Session Nurse Communication: Need for lift equipment  Activity Tolerance: Patient limited by pain Patient left: in bed;with call bell/phone within reach  OT Visit Diagnosis: Muscle weakness (generalized) (M62.81)                Time: 5400-8676 OT Time Calculation (min): 10 min Charges:  OT General Charges $OT Visit: 1 Visit OT Evaluation $OT Eval Low Complexity: 1 Low  09/30/2022  AB, OTR/L  Acute Rehabilitation Services  Office: [phone]   Tristan Schroeder 09/30/2022, 10:34 AM

## 2022-09-30 NOTE — Progress Notes (Signed)
Delivered patient's TOC meds to the patient's room with her nurse's permission. Removed patient's IV at nurse's request.

## 2022-09-30 NOTE — Plan of Care (Signed)
Patient to discharge home today.

## 2022-09-30 NOTE — Progress Notes (Signed)
Patient is discharging home with PTAR and nurse aide notified by patient. Nurse aide to meet patient at her home.

## 2022-09-30 NOTE — Discharge Summary (Signed)
[name] NWG:[id number] DOB: 07/14/1955 DOA: 09/27/2022  PCP: Jamey Reas, PA-C  Admit date: 09/27/2022  Discharge date: 09/30/2022  Admitted From: Home   Disposition:  Home   Recommendations for Outpatient Follow-up:   Follow up with PCP in 1-2 weeks  PCP Please obtain BMP/CBC, 2 view CXR in 1week,  (see Discharge instructions)   PCP Please follow up on the following pending results: monitor blood pressure, BMP and magnesium closely   Home Health: as before   Equipment/Devices: None  Consultations: None  Discharge Condition: Stable    CODE STATUS: Full    Diet Recommendation: Heart Healthy Low Carb, 1.5 L fluid restriction per day    Chief Complaint  Patient presents with   Drug Overdose     Brief history of present illness from the day of admission and additional interim summary    67 year old female with PMHx of extreme morbid obesity, OSA on CPAP, HFpEF, chronic pain syndrome, CKD IIIb, HTN, hypothyroidism.  The patient has had multiple recent admissions 6/13-6/15, 6/4-6/7 with hypoxia related to OSA/OHS/HFpEF patient diuresed.she was readmitted with acute on chronic kidney injury.  Today the patient re-presents with altered mentation and loss of consciousness.  In the ER she was found to be dehydrated, hypotensive, and AKI and admitted to the hospital.  Apparently she responded to Narcan in the ER however claims does not take narcotics.                                                                  Hospital Course   Dehydration, hypotension, AKI caused by combination of diuretics and ARB, offending medications high-dose diuretics, ARB HCTZ combination, high dose Norvasc were held, adequately hydrated, AKI and dehydration has resolved, blood pressures have stabilized.  Will be discharged  home currently see medication changes below, PCP to monitor blood pressure, BMP closely.   Unintentional narcotic overdose.  Discontinue oxycodone.  Responded to Narcan.  Requested to refrain from more than prescribed use of narcotics.   Hypertension.  Blood pressure was low here.  Being discharged on 5 mg of Norvasc which is half her home dose.  And half home dose of diuretics, home HCTZ ARB combination discontinued,  PCP to monitor closely.    AKI and hyperkalemia.  Resolved after hydration with IV fluids, offending medications were held, PCP to monitor closely.   Diastolic CHF.  EF 65%.  Was dehydrated, adequately hydrated now euvolemic, home diuretics cut in half along with potassium supplement which was cut in half, ARB HCTZ combination discontinued.  PCP to monitor blood pressure, BMP and diuretic dose closely, have also cut her Norvasc in half as blood pressures are stable on low-dose.   Chronic pain.  Continue Lyrica and Flexeril, requested to refrain from overusing narcotics, has as needed Narcan  at home.   Chronic weakness, deconditioning, wheelchair/bedbound status.  Supportive care.  PT OT.   Schizophrenia paranoid.  Resume home medication Geodon   Hypothyroidism.  On Synthroid.   Secondary and chronic hyperparathyroidism.  Resume Rocaltrol.   Morbid obesity with OSA.  Supportive care.  Nighttime CPAP.     GERD.  PPI.     Dyslipidemia.  On statin.     DM type II.  Continue home regimen  Discharge diagnosis     Principal Problem:   Drug overdose, accidental or unintentional, initial encounter Active Problems:   Secondary parkinsonism (HCC)   Chronic diastolic CHF (congestive heart failure) (HCC)   Hypothyroidism   HLD (hyperlipidemia)   Type II diabetes mellitus (HCC)   OSA on CPAP   Diabetic polyneuropathy (HCC)   Diabetes mellitus, type II, insulin dependent (HCC)   Schizophrenia, paranoid (HCC)   Hyperparathyroidism, secondary (HCC)   History of malignant  neoplasm of breast   Acute kidney injury superimposed on chronic kidney disease (HCC)   Acute on chronic respiratory failure (HCC)   Dehydration   Anxiety and depression   Overdose, accidental or unintentional, initial encounter    Discharge instructions    Discharge Instructions     Discharge instructions   Complete by: As directed    Follow with Primary MD Jamey Reas, PA-C in 7 days   Get CBC, CMP, Magnesium, 2 view Chest X ray -  checked next visit with your primary MD   Activity: As tolerated with Full fall precautions use walker/cane & assistance as needed  Disposition Home   Diet: Heart Healthy low carbohydrate diet, check CBGs q. ACH S.    Check your Weight same time everyday, if you gain over 2 pounds, or you develop in leg swelling, experience more shortness of breath or chest pain, call your Primary MD immediately. Follow Cardiac Low Salt Diet and 1.5 lit/day fluid restriction.  Special Instructions: If you have smoked or chewed Tobacco  in the last 2 yrs please stop smoking, stop any regular Alcohol  and or any Recreational drug use.  On your next visit with your primary care physician please Get Medicines reviewed and adjusted.  Please request your Prim.MD to go over all Hospital Tests and Procedure/Radiological results at the follow up, please get all Hospital records sent to your Prim MD by signing hospital release before you go home.  If you experience worsening of your admission symptoms, develop shortness of breath, life threatening emergency, suicidal or homicidal thoughts you must seek medical attention immediately by calling 911 or calling your MD immediately  if symptoms less severe.  You Must read complete instructions/literature along with all the possible adverse reactions/side effects for all the Medicines you take and that have been prescribed to you. Take any new Medicines after you have completely understood and accpet all the possible adverse  reactions/side effects.   Increase activity slowly   Complete by: As directed        Discharge Medications   Allergies as of 09/30/2022       Reactions   Bee Venom Anaphylaxis   Invokana [canagliflozin] Anaphylaxis   Dyspnea and urinary retention   Shellfish Allergy Anaphylaxis   Buprenorphine Hcl Other (See Comments)   Overly sedated with morphine drip.   Other Other (See Comments)   Cats - bad asthma, stopped up        Medication List     STOP taking these medications    losartan-hydrochlorothiazide 100-25 MG  tablet Commonly known as: HYZAAR   Olmesartan-amLODIPine-HCTZ 40-10-12.5 MG Tabs       TAKE these medications    acetaminophen 650 MG CR tablet Commonly known as: TYLENOL Take 1,300 mg by mouth in the morning and at bedtime.   albuterol 108 (90 Base) MCG/ACT inhaler Commonly known as: VENTOLIN HFA Inhale 1 puff into the lungs as needed for wheezing or shortness of breath.   amLODipine 5 MG tablet Commonly known as: NORVASC Take 1 tablet (5 mg total) by mouth daily. What changed:  medication strength how much to take   aspirin EC 81 MG tablet Take 1 tablet (81 mg total) by mouth daily.   Belbuca 750 MCG Film Generic drug: Buprenorphine HCl Take 750 mcg by mouth 2 (two) times daily.   calcitRIOL 0.25 MCG capsule Commonly known as: ROCALTROL Take 1 capsule (0.25 mcg total) by mouth 3 (three) times a week.   cyclobenzaprine 5 MG tablet Commonly known as: FLEXERIL Take 5 mg by mouth daily.   desmopressin 0.2 MG tablet Commonly known as: DDAVP Take 200 mcg by mouth at bedtime.   doxepin 50 MG capsule Commonly known as: SINEQUAN Take 50 mg by mouth at bedtime.   esomeprazole 40 MG capsule Commonly known as: NEXIUM Take 1 capsule (40 mg total) by mouth daily before breakfast.   famotidine 40 MG tablet Commonly known as: PEPCID Take 40 mg by mouth daily after supper.   ferrous sulfate 325 (65 FE) MG tablet Take 325 mg by mouth in the  morning.   HumuLIN N KwikPen 100 UNIT/ML KwikPen Generic drug: Insulin NPH (Human) (Isophane) Inject 25 Units into the skin every morning. And pen needles 1/day What changed:  when to take this additional instructions   levothyroxine 200 MCG tablet Commonly known as: SYNTHROID Take 1 tablet (200 mcg total) by mouth daily before breakfast.   linaclotide 290 MCG Caps capsule Commonly known as: LINZESS Take 290 mcg by mouth daily before breakfast.   LORazepam 1 MG tablet Commonly known as: ATIVAN Take 1 mg by mouth in the morning, at noon, and at bedtime.   mirabegron ER 25 MG Tb24 tablet Commonly known as: MYRBETRIQ Take 25 mg by mouth daily.   naloxone 4 MG/0.1ML Liqd nasal spray kit Commonly known as: NARCAN Place 1 spray into the nose as needed for opioid reversal.   OneTouch Delica Plus Lancet33G Misc 1 each by Other route 3 (three) times daily. Use to check blood sugar 3 times a day   OneTouch Verio test strip Generic drug: glucose blood TEST BLOOD SUGAR THREE TIMES DAILY   oxyCODONE 5 MG immediate release tablet Commonly known as: Oxy IR/ROXICODONE Take 5 mg by mouth in the morning and at bedtime.   polyethylene glycol 17 g packet Commonly known as: MIRALAX / GLYCOLAX Take 17 g by mouth daily as needed for mild constipation.   potassium chloride 10 MEQ tablet Commonly known as: KLOR-CON M Take 1 tablet (10 mEq total) by mouth daily. What changed:  medication strength how much to take   pregabalin 300 MG capsule Commonly known as: LYRICA Take 300 mg by mouth 2 (two) times daily.   rosuvastatin 20 MG tablet Commonly known as: CRESTOR Take 20 mg by mouth daily.   sucralfate 1 g tablet Commonly known as: CARAFATE Take 1 g by mouth daily.   Sure Comfort Pen Needles 32G X 6 MM Misc Generic drug: Insulin Pen Needle USE TO INJECT INSULIN FOUR TIMES DAILY   GNP UltiCare  Pen Needles 32G X 4 MM Misc Generic drug: Insulin Pen Needle 1 Needle by Does not  apply route 4 (four) times daily.   tolterodine 2 MG 24 hr capsule Commonly known as: DETROL LA Take 2 mg by mouth daily.   torsemide 10 MG tablet Commonly known as: DEMADEX Take 1 tablet (10 mg total) by mouth daily. What changed:  medication strength how much to take   triamcinolone cream 0.1 % Commonly known as: KENALOG Apply 1 Application topically 2 (two) times daily.   Trulicity 4.5 MG/0.5ML Sopn Generic drug: Dulaglutide Inject 4.5 mg into the skin once a week.   valACYclovir 1000 MG tablet Commonly known as: VALTREX Take 1,000 mg by mouth daily.   venlafaxine XR 150 MG 24 hr capsule Commonly known as: EFFEXOR-XR Take 150 mg by mouth 2 (two) times daily.   Vitamin D (Ergocalciferol) 1.25 MG (50000 UNIT) Caps capsule Commonly known as: DRISDOL Take 50,000 Units by mouth once a week.   Vitamin D-3 25 MCG (1000 UT) Caps Take 1,000 Units by mouth daily.   ziprasidone 60 MG capsule Commonly known as: GEODON Take 60 mg by mouth daily after supper.         Follow-up Information     Daisie, Zaring, PA-C. Schedule an appointment as soon as possible for a visit in 3 day(s).   Specialty: Physician Assistant Contact information: [address] [place], [place] [address] [phone]                 Major procedures and Radiology Reports - PLEASE review detailed and final reports thoroughly  -     DG Chest 2 View  Result Date: 09/28/2022 CLINICAL DATA:  Syncope EXAM: CHEST - 2 VIEW COMPARISON:  07/30/2022 FINDINGS: Lungs are clear.  No pleural effusion or pneumothorax. The heart is top-normal in size. Visualized osseous structures are within normal limits. Surgical clips overlying the right neck. IMPRESSION: Normal chest radiographs. Electronically Signed   By: Charline Bills M.D.   On: 09/28/2022 00:11   CT Head Wo Contrast  Result Date: 09/27/2022 CLINICAL DATA:  Altered mental status EXAM: CT HEAD WITHOUT CONTRAST TECHNIQUE: Contiguous axial  images were obtained from the base of the skull through the vertex without intravenous contrast. RADIATION DOSE REDUCTION: This exam was performed according to the departmental dose-optimization program which includes automated exposure control, adjustment of the mA and/or kV according to patient size and/or use of iterative reconstruction technique. COMPARISON:  09/29/2020 FINDINGS: Brain: No evidence of acute infarction, hemorrhage, hydrocephalus, extra-axial collection or mass lesion/mass effect. Partially empty sella is noted stable from the prior exam. Vascular: No hyperdense vessel or unexpected calcification. Skull: Normal. Negative for fracture or focal lesion. Sinuses/Orbits: Mild fluid is noted within the left mastoid air cells. No other focal abnormality is noted. Other: None. IMPRESSION: Mild left mastoid effusion No acute intracranial abnormality noted. Electronically Signed   By: Alcide Clever M.D.   On: 09/27/2022 22:22    Micro Results    No results found for this or any previous visit (from the past 240 hour(s)).  Today   Subjective    [name] today has no headache,no chest abdominal pain,no new weakness tingling or numbness, feels much better wants to go home today.    Objective   Blood pressure 113/62, pulse 81, temperature 98.5 F (36.9 C), temperature source Oral, resp. rate 18, height 5\' 5"  (1.651 m), weight (!) 156.2 kg, SpO2 96%.   Intake/Output Summary (Last 24  hours) at 09/30/2022 1610 Last data filed at 09/30/2022 0400 Gross per 24 hour  Intake 480 ml  Output 3600 ml  Net -3120 ml    Exam  Awake Alert, No new F.N deficits,    .AT,PERRAL Supple Neck,   Symmetrical Chest wall movement, Good air movement bilaterally, CTAB RRR,No Gallops,   +ve B.Sounds, Abd Soft, Non tender,  No Cyanosis, Clubbing or edema    Data Review   Recent Labs  Lab 09/27/22 2302 09/28/22 0451 09/30/22 0604  WBC 13.1* 10.3 6.1  HGB 9.6* 9.0* 8.6*  HCT 29.8* 27.9*  25.9*  PLT 178 171 183  MCV 82.8 82.1 80.4  MCH 26.7 26.5 26.7  MCHC 32.2 32.3 33.2  RDW 17.3* 17.2* 17.0*  LYMPHSABS 1.0 1.5 1.7  MONOABS 0.6 0.5 0.5  EOSABS 0.2 0.3 0.3  BASOSABS 0.0 0.1 0.1    Recent Labs  Lab 09/27/22 2302 09/28/22 0117 09/28/22 0405 09/29/22 0236 09/30/22 0604  NA 127*  --  129* 134* 136  K 5.7* 5.1 5.2* 4.6 4.2  CL 91*  --  97* 102 107  CO2 17*  --  15* 18* 19*  ANIONGAP 19*  --  17* 14 10  GLUCOSE 117*  --  102* 87 89  BUN 126*  --  124* 93* 64*  CREATININE 4.84*  --  4.34* 2.73* 1.66*  AST 15  --   --   --   --   ALT 23  --   --   --   --   ALKPHOS 62  --   --   --   --   BILITOT 0.4  --   --   --   --   ALBUMIN 3.9  --   --   --   --   BNP  --   --   --   --  32.3  MG  --   --  2.6*  --  2.1  CALCIUM 9.2  --  8.6* 8.8* 8.7*    Total Time in preparing paper work, data evaluation and todays exam - 35 minutes  Signature  -    Susa Raring M.D on 09/30/2022 at 9:27 AM   -  To page go to [email or web address]

## 2022-10-07 ENCOUNTER — Other Ambulatory Visit: Payer: 59

## 2022-10-13 ENCOUNTER — Ambulatory Visit (INDEPENDENT_AMBULATORY_CARE_PROVIDER_SITE_OTHER): Payer: 59 | Admitting: Internal Medicine

## 2022-10-13 ENCOUNTER — Encounter: Payer: Self-pay | Admitting: Internal Medicine

## 2022-10-13 VITALS — BP 102/55 | Ht 65.0 in

## 2022-10-13 DIAGNOSIS — N181 Chronic kidney disease, stage 1: Secondary | ICD-10-CM | POA: Diagnosis not present

## 2022-10-13 DIAGNOSIS — E1122 Type 2 diabetes mellitus with diabetic chronic kidney disease: Secondary | ICD-10-CM

## 2022-10-13 DIAGNOSIS — E2089 Other specified hypoparathyroidism: Secondary | ICD-10-CM | POA: Diagnosis not present

## 2022-10-13 DIAGNOSIS — Z7985 Long-term (current) use of injectable non-insulin antidiabetic drugs: Secondary | ICD-10-CM | POA: Diagnosis not present

## 2022-10-13 DIAGNOSIS — E1159 Type 2 diabetes mellitus with other circulatory complications: Secondary | ICD-10-CM

## 2022-10-13 DIAGNOSIS — E1165 Type 2 diabetes mellitus with hyperglycemia: Secondary | ICD-10-CM

## 2022-10-13 DIAGNOSIS — E890 Postprocedural hypothyroidism: Secondary | ICD-10-CM

## 2022-10-13 DIAGNOSIS — Z794 Long term (current) use of insulin: Secondary | ICD-10-CM

## 2022-10-13 LAB — POCT GLYCOSYLATED HEMOGLOBIN (HGB A1C): Hemoglobin A1C: 6.2 % — AB (ref 4.0–5.6)

## 2022-10-13 MED ORDER — HUMULIN N KWIKPEN 100 UNIT/ML ~~LOC~~ SUPN
20.0000 [IU] | PEN_INJECTOR | Freq: Every day | SUBCUTANEOUS | Status: DC
Start: 2022-10-13 — End: 2022-12-08

## 2022-10-13 MED ORDER — ONETOUCH VERIO REFLECT W/DEVICE KIT: PACK | 0 refills | Status: DC

## 2022-10-13 NOTE — Patient Instructions (Addendum)
Please continue: - Trulicity 4.5 mg weekly  Please decrease: - NPH 20 units before breakfast  Check some sugars later in the day, rotating the check times.  Continue Levothyroxine 200 mcg daily.  Take the thyroid hormone every day, with water, at least 30 minutes before breakfast, separated by at least 4 hours from: - acid reflux medications - calcium - iron - multivitamins  Please continue vitamin D 1000 units.  Please stop at the lab.  Please return in 6 months.

## 2022-10-13 NOTE — Progress Notes (Unsigned)
Patient ID: [name], female   DOB: [birth year], 67 y.o.   MRN: [id number]  HPI: [name] is a 67 y.o.-year-old female, returning for follow-up for DM2, dx in 2014, insulin-dependent since 2015, uncontrolled, with  complications (h/o TIA, PVD, CKD, PN), also postsurgical hypothyroidism and hypoparathyroidism. Pt. previously saw Dr. Everardo All, but last visit with me 6 months ago.  Interim history: She continues to have increased weakness and falls.  She was actually admitted 09/27/2022 for lethargy and opioid overdose.  She was previously admitted for hypoxia 07/30/2022 and 07/22/2022. No increased urination, nausea, chest pain. She has occasional blurry vision. She has B shoulder pain.  DM2: Reviewed HbA1c: Lab Results  Component Value Date   HGBA1C 6.0 (A) 04/24/2022   HGBA1C 6.8 (A) 11/13/2021   HGBA1C 9.6 (A) 04/26/2021   HGBA1C 10.4 (A) 02/26/2021   HGBA1C 8.3 (A) 12/14/2020   HGBA1C 9.4 (H) 09/30/2020   HGBA1C 9.0 (A) 07/10/2020   HGBA1C 8.5 (A) 03/06/2020   HGBA1C 8.1 (A) 01/04/2020   HGBA1C 7.6 (A) 11/02/2019   Pt is on a regimen of: - Trulicity 4.5 mg weekly - NPH 45 >> 55 >> 45 >> 25 units in am (before b'fast)  Pt checks her sugars 1x a day and they are: - am: 96-150 >> 64-125 - 2h after b'fast: n/c >> 44, 84-109 >> 78-165, 187 - before lunch: n/c >> 118 >> 115, 118 - 2h after lunch: n/c >> 139, 153 - before dinner: n/c >> 185 - 2h after dinner: n/c - bedtime: n/c - nighttime: n/c Lowest sugar was 75 >> 44 >> 64; she has hypoglycemia awareness at 70.  Highest sugar was 150 >> <200  Glucometer: One Touch Verio  - + CKD, last BUN/creatinine - seeing nephrology: Lab Results  Component Value Date   BUN 64 (H) 09/30/2022   BUN 93 (H) 09/29/2022   CREATININE 1.66 (H) 09/30/2022   CREATININE 2.73 (H) 09/29/2022  No results found for: "MICRALBCREAT" She is on losartan 100 mg daily.  -+ HL; last set of lipids: Lab Results  Component Value Date   CHOL  145 04/24/2022   HDL 69.40 04/24/2022   LDLCALC 47 04/24/2022   TRIG 146.0 04/24/2022   CHOLHDL 2 04/24/2022  She is on Crestor 20 mg daily.  - last eye exam was in 2024: No DR reportedly. She has glaucoma and thyroid eye disease reportedly.  - no numbness and tingling in her feet.  Last foot exam was on 10/01/2021. Seeing Dr. Harriet Pho.  Postsurgical hypothyroidism:  Pt is on levothyroxine 200 mcg daily, taken: - in am - fasting - at least 30 min from b'fast - no calcium - + iron - no multivitamins - + PPIs and H2B - not on Biotin  Reviewed her TSH levels: Lab Results  Component Value Date   TSH 7.961 (H) 07/23/2022   TSH 8.23 (H) 04/24/2022   TSH 1.75 12/12/2021   TSH 5.13 02/26/2021   TSH 8.111 (H) 10/03/2020   TSH 3.752 09/30/2020   TSH 4.85 (H) 07/10/2020   TSH 7.74 (H) 05/08/2020   TSH 5.86 (H) 01/04/2020   TSH 4.42 06/29/2019   TSH 1.41 12/17/2018   TSH 1.04 09/02/2018   TSH 8.04 (H) 03/23/2018   TSH 3.01 12/15/2017   TSH 4.04 06/15/2017   TSH 4.769 (H) 11/14/2016   TSH 1.63 08/22/2016   TSH 10.93 (A) 04/10/2016   TSH 2.363 03/28/2016   TSH 0.269 (L) 02/14/2016   Postsurgical hypocalcemia/hypoparathyroidism:  Reviewed pertinent labs: Component     Latest Ref Rng 12/12/2021  Calcium Ionized     4.7 - 5.5 mg/dL 4.9   Vitamin D 1, 25 (OH) Total     18 - 72 pg/mL 43   Vitamin D3 1, 25 (OH)     pg/mL 13   Vitamin D2 1, 25 (OH)     pg/mL 30    Lab Results  Component Value Date   PTH 36 04/26/2021   PTH 38 05/08/2020   PTH 55 01/04/2020   PTH 31 03/23/2018   PTH 80 (H) 08/22/2016   CALCIUM 8.7 (L) 09/30/2022   CALCIUM 8.8 (L) 09/29/2022   CALCIUM 8.6 (L) 09/28/2022   CALCIUM 9.2 09/27/2022   CALCIUM 8.8 (L) 08/02/2022   CALCIUM 8.7 (L) 08/01/2022   CALCIUM 8.3 (L) 07/31/2022   CALCIUM 9.0 07/30/2022   CALCIUM 9.3 07/25/2022   CALCIUM 8.9 07/24/2022   CALCIUM 8.7 (L) 07/23/2022   CALCIUM 9.1 07/22/2022   CALCIUM 9.4 09/11/2021   CALCIUM  9.4 09/10/2021   CALCIUM 9.4 09/09/2021   CALCIUM 9.3 09/08/2021   CALCIUM 9.0 09/07/2021   CALCIUM 9.4 09/06/2021   CALCIUM 9.3 09/05/2021   CALCIUM 8.7 (L) 09/04/2021   Levels have been normal: Lab Results  Component Value Date   VD25OH 53.82 12/12/2021   VD25OH 73.07 04/26/2021   VD25OH 77.34 05/08/2020   VD25OH 73.50 01/04/2020   VD25OH 45.33 03/23/2018   VD25OH 43.39 08/22/2016   VD25OH 40 05/12/2013   She is on: -  >> stopped 10/2021 - vit D3 1000 units daily  No perioral numbness. She has muscle cramping.  Patient also has a history of schizophrenia, HTN, anemia, BPPV, GERD, breast cancer, OSA-on, obesity, low back pain.  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   partial   benign cyst  Left    removed from left breast   BREAST BIOPSY Right 02/13/2012   BREAST LUMPECTOMY  03/03/2012   Procedure: LUMPECTOMY;  Surgeon: Currie Paris, MD;  Location: [facility] OR;  Service: General;  Laterality: Right;   BREAST SURGERY Right 01/2012   "cancer"   CHOLECYSTECTOMY  1980's?   COLONOSCOPY  WITH PROPOFOL N/A 09/28/2012   Procedure: COLONOSCOPY WITH PROPOFOL;  Surgeon: Shirley Friar, MD;  Location: [facility];  Service: Endoscopy;  Laterality: N/A;   FOOT FRACTURE SURGERY Right 1990's   JOINT REPLACEMENT  x 3   LESION EXCISION N/A 03/13/2021   Procedure: EXCISION SCALP SEBACEOUS CYSTS x2;  Surgeon: Axel Filler, MD;  Location: [facility] OR;  Service: General;  Laterality: N/A;  60 MINUTES LOCAL & MAC   LUMBAR LAMINECTOMY/DECOMPRESSION MICRODISCECTOMY N/A 12/05/2016   Procedure: LEFT L5-S1 MICRODISCECTOMY;  Surgeon: Eldred Manges, MD;  Location: [facility] OR;  Service: Orthopedics;  Laterality: N/A;   REVISION TOTAL KNEE ARTHROPLASTY  07/2009   SHOULDER OPEN ROTATOR CUFF REPAIR Left 1990's   THYROIDECTOMY  1970's   TOTAL KNEE ARTHROPLASTY Bilateral 2009-06/2009   left; right   Social History   Socioeconomic History   Marital status: Single    Spouse name: Not on file   Number of children: 2   Years of education: 12   Highest education level: Not on file  Occupational History   Occupation: Disabled  Tobacco Use   Smoking status: Former    Current packs/day: 0.00    Average packs/day: 1 pack/day for 7.0 years (7.0 ttl pk-yrs)    Types: Cigarettes    Start date: 03/24/1985    Quit date: 03/24/1992    Years since quitting: 30.5   Smokeless tobacco: Never  Vaping Use   Vaping status: Never Used  Substance and Sexual Activity   Alcohol use: No    Comment: "stopped drinking in 1996; I was an alcoholic"   Drug use: No   Sexual activity: Never    Birth control/protection: Surgical  Other Topics Concern   Not on file  Social History Narrative   Single. Disabled Company secretary.          Patient is right handed.    Denies caffeine use    Social Determinants of Health   Financial Resource Strain: Not on file  Food Insecurity: No Food Insecurity (09/28/2022)   Hunger Vital Sign    Worried About Running Out of Food in the Last Year: Never true    Ran Out of Food in  the Last Year: Never true  Transportation Needs: No Transportation Needs (09/28/2022)   PRAPARE - Administrator, Civil Service (Medical): No    Lack of Transportation (Non-Medical): No  Physical Activity: Not on file  Stress: Not on file  Social Connections: Not on file  Intimate Partner Violence: Not At Risk (09/28/2022)   Humiliation, Afraid, Rape, and Kick questionnaire    Fear of Current or Ex-Partner: No    Emotionally Abused: No    Physically Abused: No    Sexually Abused: No   Current Outpatient Medications on File Prior to Visit  Medication Sig Dispense Refill   acetaminophen (TYLENOL) 650 MG CR tablet Take 1,300 mg by mouth in the morning and at bedtime.     albuterol (VENTOLIN HFA) 108 (90 Base) MCG/ACT inhaler Inhale 1 puff into the lungs as needed for wheezing or shortness of breath.     amLODipine (NORVASC) 5 MG tablet Take 1 tablet (5 mg total) by mouth daily. 30 tablet 0   aspirin EC 81 MG tablet Take 1 tablet (81 mg total) by mouth daily. 30 tablet 0   BELBUCA 750 MCG FILM Take 750 mcg by mouth 2 (two) times daily.     calcitRIOL (ROCALTROL) 0.25 MCG capsule Take 1 capsule (0.25 mcg total) by mouth 3 (three) times a week. 28 capsule 1   Cholecalciferol (VITAMIN D-3) 1000 units CAPS Take 1,000 Units by mouth daily.     cyclobenzaprine (FLEXERIL) 5 MG tablet Take 5 mg by mouth  daily.     desmopressin (DDAVP) 0.2 MG tablet Take 200 mcg by mouth at bedtime.     doxepin (SINEQUAN) 50 MG capsule Take 50 mg by mouth at bedtime.     esomeprazole (NEXIUM) 40 MG capsule Take 1 capsule (40 mg total) by mouth daily before breakfast. 30 capsule 1   famotidine (PEPCID) 40 MG tablet Take 40 mg by mouth daily after supper.     ferrous sulfate 325 (65 FE) MG tablet Take 325 mg by mouth in the morning.     glucose blood (ONETOUCH VERIO) test strip TEST BLOOD SUGAR THREE TIMES DAILY 100 strip 11   GNP ULTICARE PEN NEEDLES 32G X 4 MM MISC 1 Needle by Does not apply route 4 (four)  times daily. 100 each 1   Insulin NPH, Human,, Isophane, (HUMULIN N KWIKPEN) 100 UNIT/ML Kiwkpen Inject 25 Units into the skin every morning. And pen needles 1/day (Patient taking differently: Inject 25 Units into the skin daily.)     Lancets (ONETOUCH DELICA PLUS LANCET33G) MISC 1 each by Other route 3 (three) times daily. Use to check blood sugar 3 times a day 300 each 11   levothyroxine (SYNTHROID) 200 MCG tablet Take 1 tablet (200 mcg total) by mouth daily before breakfast. 90 tablet 1   linaclotide (LINZESS) 290 MCG CAPS capsule Take 290 mcg by mouth daily before breakfast.     LORazepam (ATIVAN) 1 MG tablet Take 1 mg by mouth in the morning, at noon, and at bedtime.     mirabegron ER (MYRBETRIQ) 25 MG TB24 tablet Take 25 mg by mouth daily.     naloxone (NARCAN) nasal spray 4 mg/0.1 mL Place 1 spray into the nose as needed for opioid reversal.     oxyCODONE (OXY IR/ROXICODONE) 5 MG immediate release tablet Take 5 mg by mouth in the morning and at bedtime.     polyethylene glycol (MIRALAX / GLYCOLAX) 17 g packet Take 17 g by mouth daily as needed for mild constipation. 14 each 0   potassium chloride (KLOR-CON M) 10 MEQ tablet Take 1 tablet (10 mEq total) by mouth daily. 30 tablet 0   pregabalin (LYRICA) 300 MG capsule Take 300 mg by mouth 2 (two) times daily.     rosuvastatin (CRESTOR) 20 MG tablet Take 20 mg by mouth daily.     sucralfate (CARAFATE) 1 g tablet Take 1 g by mouth daily.     SURE COMFORT PEN NEEDLES 32G X 6 MM MISC USE TO INJECT INSULIN FOUR TIMES DAILY 200 each 1   tolterodine (DETROL LA) 2 MG 24 hr capsule Take 2 mg by mouth daily.     torsemide (DEMADEX) 10 MG tablet Take 1 tablet (10 mg total) by mouth daily. 30 tablet 0   triamcinolone cream (KENALOG) 0.1 % Apply 1 Application topically 2 (two) times daily.     TRULICITY 4.5 MG/0.5ML SOPN Inject 4.5 mg into the skin once a week.     valACYclovir (VALTREX) 1000 MG tablet Take 1,000 mg by mouth daily.     venlafaxine XR  (EFFEXOR-XR) 150 MG 24 hr capsule Take 150 mg by mouth 2 (two) times daily.     Vitamin D, Ergocalciferol, (DRISDOL) 1.25 MG (50000 UNIT) CAPS capsule Take 50,000 Units by mouth once a week.     ziprasidone (GEODON) 60 MG capsule Take 60 mg by mouth daily after supper.     No current facility-administered medications on file prior to visit.   Allergies  Allergen  Reactions   Bee Venom Anaphylaxis   Invokana [Canagliflozin] Anaphylaxis    Dyspnea and urinary retention   Shellfish Allergy Anaphylaxis   Buprenorphine Hcl Other (See Comments)    Overly sedated with morphine drip.   Other Other (See Comments)    Cats - bad asthma, stopped up   Family History  Problem Relation Age of Onset   Heart disease Brother        Multiple MIs, starting in his 18s   Diabetes Brother    Thyroid disease Brother    Hypertension Mother    Diabetes Mother    Breast cancer Mother 64   Bone cancer Mother    Hypertension Sister    Diabetes Sister    Breast cancer Sister 54   Thyroid disease Sister    Breast cancer Maternal Grandmother    Heart disease Maternal Grandmother    Uterine cancer Other 19   Breast cancer Paternal Aunt 46   Breast cancer Paternal Grandmother        dx in her 41s   Prostate cancer Paternal Grandfather    Asthma Son    PE: There were no vitals taken for this visit. Pt in wheelchair - could not be weighed. Wt Readings from Last 3 Encounters:  09/27/22 (!) 344 lb 5.7 oz (156.2 kg)  07/31/22 (!) 344 lb 5.7 oz (156.2 kg)  07/22/22 (!) 350 lb 8.5 oz (159 kg)   Constitutional: overweight, in NAD Eyes: no exophthalmos ENT: no thyromegaly, no cervical lymphadenopathy Cardiovascular: RRR, No MRG, + signif.  bilateral LE edema Respiratory: CTA B Musculoskeletal: no deformities Skin: no rashes Neurological: + tremor with outstretched hands L>R  ASSESSMENT: 1. DM2, insulin-dependent, uncontrolled, with complications - PVD - h/o TIA - CKD - PN  2.  Hyperlipidemia  3.   Postsurgical hypothyroidism  4.  Postsurgical hypoparathyroidism  PLAN:  1. Patient with longstanding, previously uncontrolled, type 2 diabetes, on injectable antidiabetic regimen, with intermediate acting insulin and weekly GLP-1 receptor agonist.  HbA1c at last visit was even better than prior, at 6.0%.  Sugars were either at goal or low (44) after breakfast.  I advised her to decrease her dose of NPH.  I also advised her to try to check some blood sugars later in the day, rotating check times. -At today's visit, sugars are mostly at goal, with occasional low blood sugars in the 60s fasting, in the morning.  Upon questioning, she is taking a much lower dose of NPH than at last visit, dose decreased at one of her hospitalizations.  We discussed about decreasing the dose even further to avoid further hypoglycemia.  Will continue Trulicity for now. - I suggested to:  Patient Instructions  Please continue: - Trulicity 4.5 mg weekly  Please decrease: - NPH 20 units before breakfast  Check some sugars later in the day, rotating the check times.  Continue Levothyroxine 200 mcg daily.  Take the thyroid hormone every day, with water, at least 30 minutes before breakfast, separated by at least 4 hours from: - acid reflux medications - calcium - iron - multivitamins  Please continue vitamin D 1000 units.  Please stop at the lab.  Please return in 6 months.   - we checked her HbA1c: 6.2% (slightly higher) - advised to check sugars at different times of the day - 1-2x a day, rotating check times - advised for yearly eye exams >> she is UTD - return to clinic in 6 months  2.  Hyperlipidemia -Reviewed latest  lipid panel from 04/2022: All fractions at goal: Lab Results  Component Value Date   CHOL 145 04/24/2022   HDL 69.40 04/24/2022   LDLCALC 47 04/24/2022   TRIG 146.0 04/24/2022   CHOLHDL 2 04/24/2022  -She continues Crestor 20 mg daily.  She has some muscle aches but unclear if  from Crestor.  3.  Postsurgical hypothyroidism - post total thyroidectomy for benign goiter in 1988 - latest thyroid labs reviewed with pt. >> TSH was still elevated at last check Lab Results  Component Value Date   TSH 7.961 (H) 07/23/2022  - she continues on LT4 200 mcg daily - pt feels good on this dose. - we discussed about taking the thyroid hormone every day, with water, >30 minutes before breakfast, separated by >4 hours from acid reflux medications, calcium, iron, multivitamins. Pt. is taking it correctly. - will check thyroid tests today: TSH and fT4 - If labs are abnormal, she will need to return for repeat TFTs in 1.5 months  4. Postsurgical hypoparathyroidism  -Resolved -She was previously on calcitriol 0.25 mg 3 times a week, we stopped this in 10/2021 with subsequently normal ionized calcium and calcitriol levels  -We continued 1000 units vitamin D daily -She has no muscle weakness, tingling, or perioral numbness.  She has chronic muscle aches, unclear if related to the statin. -Latest calcium level was slightly low, at 8.7 earlier this month.  Low normal or even slightly low calcium levels are actually indicated in this condition to avoid kidney complications. -Will check an ionized calcium and a vitamin D level today  Labs pending.  Carlus Pavlov, MD PhD [facility] Endocrinology

## 2022-10-15 ENCOUNTER — Other Ambulatory Visit: Payer: Self-pay | Admitting: Internal Medicine

## 2022-10-15 DIAGNOSIS — E890 Postprocedural hypothyroidism: Secondary | ICD-10-CM

## 2022-11-03 ENCOUNTER — Other Ambulatory Visit: Payer: Self-pay | Admitting: Internal Medicine

## 2022-11-03 DIAGNOSIS — E1122 Type 2 diabetes mellitus with diabetic chronic kidney disease: Secondary | ICD-10-CM

## 2022-11-03 DIAGNOSIS — E890 Postprocedural hypothyroidism: Secondary | ICD-10-CM

## 2022-11-05 ENCOUNTER — Encounter (HOSPITAL_COMMUNITY): Payer: Self-pay

## 2022-11-05 ENCOUNTER — Other Ambulatory Visit: Payer: Self-pay

## 2022-11-05 ENCOUNTER — Other Ambulatory Visit: Payer: 59

## 2022-11-05 ENCOUNTER — Emergency Department (HOSPITAL_COMMUNITY): Payer: 59

## 2022-11-05 ENCOUNTER — Inpatient Hospital Stay (HOSPITAL_COMMUNITY)
Admission: EM | Admit: 2022-11-05 | Discharge: 2022-11-08 | DRG: 194 | Disposition: A | Discharge status: Home Health | Payer: 59 | Attending: Student | Admitting: Student

## 2022-11-05 DIAGNOSIS — F39 Unspecified mood [affective] disorder: Secondary | ICD-10-CM | POA: Diagnosis present

## 2022-11-05 DIAGNOSIS — Z602 Problems related to living alone: Secondary | ICD-10-CM | POA: Diagnosis present

## 2022-11-05 DIAGNOSIS — E785 Hyperlipidemia, unspecified: Secondary | ICD-10-CM | POA: Diagnosis present

## 2022-11-05 DIAGNOSIS — Z9049 Acquired absence of other specified parts of digestive tract: Secondary | ICD-10-CM

## 2022-11-05 DIAGNOSIS — Z8049 Family history of malignant neoplasm of other genital organs: Secondary | ICD-10-CM

## 2022-11-05 DIAGNOSIS — Z8711 Personal history of peptic ulcer disease: Secondary | ICD-10-CM

## 2022-11-05 DIAGNOSIS — Z96653 Presence of artificial knee joint, bilateral: Secondary | ICD-10-CM | POA: Diagnosis present

## 2022-11-05 DIAGNOSIS — Z79890 Hormone replacement therapy (postmenopausal): Secondary | ICD-10-CM

## 2022-11-05 DIAGNOSIS — G8929 Other chronic pain: Secondary | ICD-10-CM | POA: Diagnosis present

## 2022-11-05 DIAGNOSIS — Z993 Dependence on wheelchair: Secondary | ICD-10-CM

## 2022-11-05 DIAGNOSIS — T4275XA Adverse effect of unspecified antiepileptic and sedative-hypnotic drugs, initial encounter: Secondary | ICD-10-CM | POA: Diagnosis present

## 2022-11-05 DIAGNOSIS — E1122 Type 2 diabetes mellitus with diabetic chronic kidney disease: Secondary | ICD-10-CM | POA: Diagnosis present

## 2022-11-05 DIAGNOSIS — R0902 Hypoxemia: Secondary | ICD-10-CM | POA: Diagnosis present

## 2022-11-05 DIAGNOSIS — Z87892 Personal history of anaphylaxis: Secondary | ICD-10-CM

## 2022-11-05 DIAGNOSIS — D631 Anemia in chronic kidney disease: Secondary | ICD-10-CM | POA: Diagnosis present

## 2022-11-05 DIAGNOSIS — I10 Essential (primary) hypertension: Secondary | ICD-10-CM | POA: Diagnosis not present

## 2022-11-05 DIAGNOSIS — Z8349 Family history of other endocrine, nutritional and metabolic diseases: Secondary | ICD-10-CM

## 2022-11-05 DIAGNOSIS — M5440 Lumbago with sciatica, unspecified side: Secondary | ICD-10-CM

## 2022-11-05 DIAGNOSIS — I129 Hypertensive chronic kidney disease with stage 1 through stage 4 chronic kidney disease, or unspecified chronic kidney disease: Secondary | ICD-10-CM | POA: Diagnosis present

## 2022-11-05 DIAGNOSIS — Z6841 Body Mass Index (BMI) 40.0 and over, adult: Secondary | ICD-10-CM

## 2022-11-05 DIAGNOSIS — Z808 Family history of malignant neoplasm of other organs or systems: Secondary | ICD-10-CM

## 2022-11-05 DIAGNOSIS — N1832 Chronic kidney disease, stage 3b: Secondary | ICD-10-CM | POA: Diagnosis present

## 2022-11-05 DIAGNOSIS — Z9889 Other specified postprocedural states: Secondary | ICD-10-CM

## 2022-11-05 DIAGNOSIS — Z7982 Long term (current) use of aspirin: Secondary | ICD-10-CM

## 2022-11-05 DIAGNOSIS — Z803 Family history of malignant neoplasm of breast: Secondary | ICD-10-CM

## 2022-11-05 DIAGNOSIS — Z8249 Family history of ischemic heart disease and other diseases of the circulatory system: Secondary | ICD-10-CM

## 2022-11-05 DIAGNOSIS — F32A Depression, unspecified: Secondary | ICD-10-CM | POA: Diagnosis present

## 2022-11-05 DIAGNOSIS — F209 Schizophrenia, unspecified: Secondary | ICD-10-CM | POA: Diagnosis present

## 2022-11-05 DIAGNOSIS — Z794 Long term (current) use of insulin: Secondary | ICD-10-CM | POA: Diagnosis not present

## 2022-11-05 DIAGNOSIS — Z1152 Encounter for screening for COVID-19: Secondary | ICD-10-CM | POA: Diagnosis not present

## 2022-11-05 DIAGNOSIS — I350 Nonrheumatic aortic (valve) stenosis: Secondary | ICD-10-CM | POA: Diagnosis present

## 2022-11-05 DIAGNOSIS — E039 Hypothyroidism, unspecified: Secondary | ICD-10-CM | POA: Diagnosis not present

## 2022-11-05 DIAGNOSIS — Z7985 Long-term (current) use of injectable non-insulin antidiabetic drugs: Secondary | ICD-10-CM

## 2022-11-05 DIAGNOSIS — E1142 Type 2 diabetes mellitus with diabetic polyneuropathy: Secondary | ICD-10-CM | POA: Diagnosis present

## 2022-11-05 DIAGNOSIS — G219 Secondary parkinsonism, unspecified: Secondary | ICD-10-CM | POA: Diagnosis present

## 2022-11-05 DIAGNOSIS — J45909 Unspecified asthma, uncomplicated: Secondary | ICD-10-CM | POA: Diagnosis present

## 2022-11-05 DIAGNOSIS — Z8042 Family history of malignant neoplasm of prostate: Secondary | ICD-10-CM

## 2022-11-05 DIAGNOSIS — Z833 Family history of diabetes mellitus: Secondary | ICD-10-CM

## 2022-11-05 DIAGNOSIS — G928 Other toxic encephalopathy: Secondary | ICD-10-CM | POA: Diagnosis present

## 2022-11-05 DIAGNOSIS — J9811 Atelectasis: Secondary | ICD-10-CM | POA: Diagnosis present

## 2022-11-05 DIAGNOSIS — K5900 Constipation, unspecified: Secondary | ICD-10-CM | POA: Diagnosis present

## 2022-11-05 DIAGNOSIS — Z23 Encounter for immunization: Secondary | ICD-10-CM | POA: Diagnosis present

## 2022-11-05 DIAGNOSIS — G4733 Obstructive sleep apnea (adult) (pediatric): Secondary | ICD-10-CM | POA: Diagnosis present

## 2022-11-05 DIAGNOSIS — L8990 Pressure ulcer of unspecified site, unspecified stage: Secondary | ICD-10-CM | POA: Insufficient documentation

## 2022-11-05 DIAGNOSIS — N179 Acute kidney failure, unspecified: Secondary | ICD-10-CM | POA: Diagnosis present

## 2022-11-05 DIAGNOSIS — D638 Anemia in other chronic diseases classified elsewhere: Secondary | ICD-10-CM | POA: Diagnosis present

## 2022-11-05 DIAGNOSIS — Z79899 Other long term (current) drug therapy: Secondary | ICD-10-CM

## 2022-11-05 DIAGNOSIS — Z91013 Allergy to seafood: Secondary | ICD-10-CM

## 2022-11-05 DIAGNOSIS — J189 Pneumonia, unspecified organism: Principal | ICD-10-CM | POA: Diagnosis present

## 2022-11-05 DIAGNOSIS — Z888 Allergy status to other drugs, medicaments and biological substances status: Secondary | ICD-10-CM

## 2022-11-05 DIAGNOSIS — L89151 Pressure ulcer of sacral region, stage 1: Secondary | ICD-10-CM | POA: Diagnosis present

## 2022-11-05 DIAGNOSIS — M545 Low back pain, unspecified: Secondary | ICD-10-CM | POA: Diagnosis present

## 2022-11-05 DIAGNOSIS — E119 Type 2 diabetes mellitus without complications: Secondary | ICD-10-CM

## 2022-11-05 DIAGNOSIS — E890 Postprocedural hypothyroidism: Secondary | ICD-10-CM | POA: Diagnosis present

## 2022-11-05 DIAGNOSIS — Z923 Personal history of irradiation: Secondary | ICD-10-CM

## 2022-11-05 DIAGNOSIS — Z90711 Acquired absence of uterus with remaining cervical stump: Secondary | ICD-10-CM

## 2022-11-05 DIAGNOSIS — E782 Mixed hyperlipidemia: Secondary | ICD-10-CM

## 2022-11-05 DIAGNOSIS — K219 Gastro-esophageal reflux disease without esophagitis: Secondary | ICD-10-CM | POA: Diagnosis present

## 2022-11-05 DIAGNOSIS — Z825 Family history of asthma and other chronic lower respiratory diseases: Secondary | ICD-10-CM

## 2022-11-05 DIAGNOSIS — Z87891 Personal history of nicotine dependence: Secondary | ICD-10-CM

## 2022-11-05 DIAGNOSIS — N3281 Overactive bladder: Secondary | ICD-10-CM | POA: Diagnosis present

## 2022-11-05 DIAGNOSIS — Z79891 Long term (current) use of opiate analgesic: Secondary | ICD-10-CM

## 2022-11-05 DIAGNOSIS — Z91030 Bee allergy status: Secondary | ICD-10-CM

## 2022-11-05 DIAGNOSIS — F419 Anxiety disorder, unspecified: Secondary | ICD-10-CM | POA: Diagnosis present

## 2022-11-05 DIAGNOSIS — Z853 Personal history of malignant neoplasm of breast: Secondary | ICD-10-CM

## 2022-11-05 LAB — CBC WITH DIFFERENTIAL/PLATELET
Abs Immature Granulocytes: 0.08 10*3/uL — ABNORMAL HIGH (ref 0.00–0.07)
Basophils Absolute: 0.1 10*3/uL (ref 0.0–0.1)
Basophils Relative: 1 %
Eosinophils Absolute: 0.7 10*3/uL — ABNORMAL HIGH (ref 0.0–0.5)
Eosinophils Relative: 7 %
HCT: 30.2 % — ABNORMAL LOW (ref 36.0–46.0)
Hemoglobin: 9.2 g/dL — ABNORMAL LOW (ref 12.0–15.0)
Immature Granulocytes: 1 %
Lymphocytes Relative: 21 %
Lymphs Abs: 2.2 10*3/uL (ref 0.7–4.0)
MCH: 26.6 pg (ref 26.0–34.0)
MCHC: 30.5 g/dL (ref 30.0–36.0)
MCV: 87.3 fL (ref 80.0–100.0)
Monocytes Absolute: 0.4 10*3/uL (ref 0.1–1.0)
Monocytes Relative: 4 %
Neutro Abs: 6.7 10*3/uL (ref 1.7–7.7)
Neutrophils Relative %: 66 %
Platelets: 227 10*3/uL (ref 150–400)
RBC: 3.46 MIL/uL — ABNORMAL LOW (ref 3.87–5.11)
RDW: 17.8 % — ABNORMAL HIGH (ref 11.5–15.5)
WBC: 10.1 10*3/uL (ref 4.0–10.5)
nRBC: 0 % (ref 0.0–0.2)

## 2022-11-05 LAB — I-STAT CHEM 8, ED
BUN: 41 mg/dL — ABNORMAL HIGH (ref 8–23)
Calcium, Ion: 1.18 mmol/L (ref 1.15–1.40)
Chloride: 99 mmol/L (ref 98–111)
Creatinine, Ser: 2.9 mg/dL — ABNORMAL HIGH (ref 0.44–1.00)
Glucose, Bld: 100 mg/dL — ABNORMAL HIGH (ref 70–99)
HCT: 29 % — ABNORMAL LOW (ref 36.0–46.0)
Hemoglobin: 9.9 g/dL — ABNORMAL LOW (ref 12.0–15.0)
Potassium: 4.3 mmol/L (ref 3.5–5.1)
Sodium: 136 mmol/L (ref 135–145)
TCO2: 27 mmol/L (ref 22–32)

## 2022-11-05 LAB — COMPREHENSIVE METABOLIC PANEL WITH GFR
ALT: 16 U/L (ref 0–44)
AST: 15 U/L (ref 15–41)
Albumin: 3.8 g/dL (ref 3.5–5.0)
Alkaline Phosphatase: 83 U/L (ref 38–126)
Anion gap: 12 (ref 5–15)
BUN: 44 mg/dL — ABNORMAL HIGH (ref 8–23)
CO2: 25 mmol/L (ref 22–32)
Calcium: 9.1 mg/dL (ref 8.9–10.3)
Chloride: 97 mmol/L — ABNORMAL LOW (ref 98–111)
Creatinine, Ser: 2.13 mg/dL — ABNORMAL HIGH (ref 0.44–1.00)
GFR, Estimated: 25 mL/min — ABNORMAL LOW (ref >60)
Glucose, Bld: 105 mg/dL — ABNORMAL HIGH (ref 70–99)
Potassium: 4 mmol/L (ref 3.5–5.1)
Sodium: 134 mmol/L — ABNORMAL LOW (ref 135–145)
Total Bilirubin: 0.5 mg/dL (ref 0.3–1.2)
Total Protein: 7 g/dL (ref 6.5–8.1)

## 2022-11-05 LAB — SARS CORONAVIRUS 2 BY RT PCR: SARS Coronavirus 2 by RT PCR: NEGATIVE

## 2022-11-05 LAB — I-STAT CG4 LACTIC ACID, ED: Lactic Acid, Venous: 1.7 mmol/L (ref 0.5–1.9)

## 2022-11-05 LAB — TROPONIN I (HIGH SENSITIVITY): Troponin I (High Sensitivity): 4 ng/L (ref <18)

## 2022-11-05 LAB — BRAIN NATRIURETIC PEPTIDE: B Natriuretic Peptide: 26 pg/mL (ref 0.0–100.0)

## 2022-11-05 MED ORDER — SODIUM CHLORIDE 0.9 % IV BOLUS
500.0000 mL | Freq: Once | INTRAVENOUS | Status: AC
  Administered 2022-11-05: 500 mL via INTRAVENOUS

## 2022-11-05 MED ORDER — ACETAMINOPHEN 325 MG PO TABS
650.0000 mg | ORAL_TABLET | Freq: Four times a day (QID) | ORAL | Status: DC | PRN
  Administered 2022-11-08: 650 mg via ORAL
  Filled 2022-11-05: qty 2

## 2022-11-05 MED ORDER — SODIUM CHLORIDE 0.9 % IV SOLN
1.0000 g | Freq: Once | INTRAVENOUS | Status: AC
  Administered 2022-11-05: 1 g via INTRAVENOUS
  Filled 2022-11-05: qty 10

## 2022-11-05 MED ORDER — LACTATED RINGERS IV SOLN: INTRAVENOUS | Status: AC

## 2022-11-05 MED ORDER — MELATONIN 3 MG PO TABS: 3.0000 mg | ORAL_TABLET | Freq: Every evening | ORAL | Status: DC | PRN

## 2022-11-05 MED ORDER — SODIUM CHLORIDE 0.9 % IV SOLN
500.0000 mg | INTRAVENOUS | Status: DC
  Administered 2022-11-06: 500 mg via INTRAVENOUS
  Filled 2022-11-05 (×2): qty 5

## 2022-11-05 MED ORDER — SODIUM CHLORIDE 0.9 % IV SOLN
1.0000 g | INTRAVENOUS | Status: DC
  Administered 2022-11-06 – 2022-11-07 (×2): 1 g via INTRAVENOUS
  Filled 2022-11-05 (×3): qty 10

## 2022-11-05 MED ORDER — ONDANSETRON HCL 4 MG/2ML IJ SOLN
4.0000 mg | Freq: Four times a day (QID) | INTRAMUSCULAR | Status: DC | PRN
  Administered 2022-11-08: 4 mg via INTRAVENOUS
  Filled 2022-11-05: qty 2

## 2022-11-05 MED ORDER — SODIUM CHLORIDE 0.9 % IV SOLN
500.0000 mg | Freq: Once | INTRAVENOUS | Status: AC
  Administered 2022-11-05: 500 mg via INTRAVENOUS
  Filled 2022-11-05: qty 5

## 2022-11-05 MED ORDER — ACETAMINOPHEN 650 MG RE SUPP: 650.0000 mg | Freq: Four times a day (QID) | RECTAL | Status: DC | PRN

## 2022-11-05 MED ORDER — SODIUM CHLORIDE 0.9 % IV BOLUS
1000.0000 mL | Freq: Once | INTRAVENOUS | Status: AC
  Administered 2022-11-05: 1000 mL via INTRAVENOUS

## 2022-11-05 NOTE — ED Notes (Signed)
Patient transported to CT 

## 2022-11-05 NOTE — ED Triage Notes (Signed)
BIB EMS/ nephew called d/t pt experiencing SHOB/ EMS reports hypotension/ pt currently c/o dizziness, mild SHOB, drowsiness/ denies CP at this time/ sig PMH/ denies pain

## 2022-11-05 NOTE — ED Provider Notes (Addendum)
[place] EMERGENCY DEPARTMENT AT [facility] Provider Note   CSN: [id number] Arrival date & time: 11/05/22  2103     History  Chief Complaint  Patient presents with  . Shortness of Breath  . Hypotension    [name] is a 67 y.o. female history of obesity, OSA, CKD, here presenting with shortness of breath.  Patient states that she has been short of breath for several days.  Patient also has some dizziness and poor appetite.  Patient was admitted for AKI and dehydration about a month ago.  Patient was noted to be hypotensive with blood pressure in the 90s per EMS.  The history is provided by the patient.       Home Medications Prior to Admission medications   Medication Sig Start Date End Date Taking? Authorizing Provider  acetaminophen (TYLENOL) 650 MG CR tablet Take 1,300 mg by mouth in the morning and at bedtime.    [provider]  albuterol (VENTOLIN HFA) 108 (90 Base) MCG/ACT inhaler Inhale 1 puff into the lungs as needed for wheezing or shortness of breath.    [provider]  amLODipine (NORVASC) 5 MG tablet Take 1 tablet (5 mg total) by mouth daily. 09/30/22   Leroy Sea, MD  aspirin EC 81 MG tablet Take 1 tablet (81 mg total) by mouth daily. 08/12/13   Viyuoh, Rolland Bimler, MD  BELBUCA 750 MCG FILM Take 750 mcg by mouth 2 (two) times daily. 07/10/22   [provider]  Blood Glucose Monitoring Suppl (ONETOUCH VERIO REFLECT) w/Device KIT Use as advised 10/13/22   Carlus Pavlov, MD  calcitRIOL (ROCALTROL) 0.25 MCG capsule Take 1 capsule (0.25 mcg total) by mouth 3 (three) times a week. 11/05/22   Carlus Pavlov, MD  Cholecalciferol (VITAMIN D-3) 1000 units CAPS Take 1,000 Units by mouth daily.    [provider]  cyclobenzaprine (FLEXERIL) 5 MG tablet Take 5 mg by mouth daily. 07/10/22   [provider]  desmopressin (DDAVP) 0.2 MG tablet Take 200 mcg by mouth at bedtime.    [provider]   doxepin (SINEQUAN) 50 MG capsule Take 50 mg by mouth at bedtime. 08/25/22   [provider]  esomeprazole (NEXIUM) 40 MG capsule Take 1 capsule (40 mg total) by mouth daily before breakfast. 10/30/14   Vassie Loll, MD  famotidine (PEPCID) 40 MG tablet Take 40 mg by mouth daily after supper. 06/16/20   [provider]  ferrous sulfate 325 (65 FE) MG tablet Take 325 mg by mouth in the morning.    [provider]  glucose blood (ONETOUCH VERIO) test strip TEST BLOOD SUGAR THREE TIMES DAILY 03/12/22   Carlus Pavlov, MD  GNP ULTICARE PEN NEEDLES 32G X 4 MM MISC 1 Needle by Does not apply route 4 (four) times daily. 09/25/22   Carlus Pavlov, MD  Insulin NPH, Human,, Isophane, (HUMULIN N KWIKPEN) 100 UNIT/ML Kiwkpen Inject 20 Units into the skin daily. 10/13/22   Carlus Pavlov, MD  Lancets [facility] DELICA PLUS [place]) MISC 1 each by Other route 3 (three) times daily. Use to check blood sugar 3 times a day 05/23/22   Carlus Pavlov, MD  levothyroxine (SYNTHROID) 200 MCG tablet Take 1 tablet (200 mcg total) by mouth daily before breakfast. 10/15/22   Carlus Pavlov, MD  linaclotide (LINZESS) 290 MCG CAPS capsule Take 290 mcg by mouth daily before breakfast.    [provider]  LORazepam (ATIVAN) 1 MG tablet Take 1 mg by  mouth in the morning, at noon, and at bedtime. 09/22/22   [provider]  mirabegron ER (MYRBETRIQ) 25 MG TB24 tablet Take 25 mg by mouth daily.    [provider]  naloxone [facility]) nasal spray 4 mg/0.1 mL Place 1 spray into the nose as needed for opioid reversal. 08/01/20   [provider]  oxyCODONE (OXY IR/ROXICODONE) 5 MG immediate release tablet Take 5 mg by mouth in the morning and at bedtime. 09/22/22   [provider]  polyethylene glycol (MIRALAX / GLYCOLAX) 17 g packet Take 17 g by mouth daily as needed for mild constipation. 08/02/22   Berton Mount I, MD  potassium chloride (KLOR-CON M) 10 MEQ  tablet Take 1 tablet (10 mEq total) by mouth daily. 09/30/22   Leroy Sea, MD  pregabalin (LYRICA) 300 MG capsule Take 300 mg by mouth 2 (two) times daily.    [provider]  rosuvastatin (CRESTOR) 20 MG tablet Take 20 mg by mouth daily.    [provider]  sucralfate (CARAFATE) 1 g tablet Take 1 g by mouth daily. 09/13/22   [provider]  SURE COMFORT PEN NEEDLES 32G X 6 MM MISC USE TO INJECT INSULIN FOUR TIMES DAILY 03/13/21   Romero Belling, MD  tolterodine (DETROL LA) 2 MG 24 hr capsule Take 2 mg by mouth daily.    [provider]  torsemide (DEMADEX) 10 MG tablet Take 1 tablet (10 mg total) by mouth daily. 09/30/22   Leroy Sea, MD  triamcinolone cream (KENALOG) 0.1 % Apply 1 Application topically 2 (two) times daily. 09/09/22   [provider]  TRULICITY 4.5 MG/0.5ML SOPN Inject 4.5 mg into the skin once a week. 08/26/22   [provider]  valACYclovir (VALTREX) 1000 MG tablet Take 1,000 mg by mouth daily.    [provider]  venlafaxine XR (EFFEXOR-XR) 150 MG 24 hr capsule Take 150 mg by mouth 2 (two) times daily. 05/21/15   [provider]  Vitamin D, Ergocalciferol, (DRISDOL) 1.25 MG (50000 UNIT) CAPS capsule Take 50,000 Units by mouth once a week. 07/17/22   [provider]  ziprasidone (GEODON) 60 MG capsule Take 60 mg by mouth daily after supper.    [provider]      Allergies    Bee venom, Invokana [canagliflozin], Shellfish allergy, Buprenorphine hcl, and Other    Review of Systems   Review of Systems  Respiratory:  Positive for shortness of breath.   All other systems reviewed and are negative.   Physical Exam Updated Vital Signs BP 96/61   Pulse 83   Temp 98 F (36.7 C) (Oral)   Resp 10   Wt (!) 160.6 kg   SpO2 (!) 87%   BMI 58.91 kg/m  Physical Exam Vitals and nursing note reviewed.  Constitutional:      Comments: Chronically ill  HENT:     Head: Normocephalic.      Mouth/Throat:     Pharynx: Oropharynx is clear.  Eyes:     Pupils: Pupils are equal, round, and reactive to light.  Cardiovascular:     Rate and Rhythm: Normal rate and regular rhythm.  Pulmonary:     Comments: Crackles bilateral bases. Abdominal:     General: Bowel sounds are normal.     Palpations: Abdomen is soft.  Musculoskeletal:        General: Normal range of motion.     Cervical back: Normal range of motion and neck supple.  Skin:    Capillary Refill: Capillary refill takes less than 2 seconds.     Comments: Stage I sacral decub ulcer  Neurological:     General: No focal deficit present.     Mental Status: She is oriented to person, place, and time.  Psychiatric:        Mood and Affect: Mood normal.        Behavior: Behavior normal.    ED Results / Procedures / Treatments   Labs (all labs ordered are listed, but only abnormal results are displayed) Labs Reviewed  CBC WITH DIFFERENTIAL/PLATELET - Abnormal; Notable for the following components:      Result Value   RBC 3.46 (*)    Hemoglobin 9.2 (*)    HCT 30.2 (*)    RDW 17.8 (*)    Eosinophils Absolute 0.7 (*)    Abs Immature Granulocytes 0.08 (*)    All other components within normal limits  COMPREHENSIVE METABOLIC PANEL - Abnormal; Notable for the following components:   Sodium 134 (*)    Chloride 97 (*)    Glucose, Bld 105 (*)    BUN 44 (*)    Creatinine, Ser 2.13 (*)    GFR, Estimated 25 (*)    All other components within normal limits  I-STAT CHEM 8, ED - Abnormal; Notable for the following components:   BUN 41 (*)    Creatinine, Ser 2.90 (*)    Glucose, Bld 100 (*)    Hemoglobin 9.9 (*)    HCT 29.0 (*)    All other components within normal limits  SARS CORONAVIRUS 2 BY RT PCR  CULTURE, BLOOD (ROUTINE X 2)  CULTURE, BLOOD (ROUTINE X 2)  BRAIN NATRIURETIC PEPTIDE  URINALYSIS, W/ REFLEX TO CULTURE (INFECTION SUSPECTED)  I-STAT CG4 LACTIC ACID, ED  TROPONIN I (HIGH SENSITIVITY)  TROPONIN I  (HIGH SENSITIVITY)    EKG EKG Interpretation Date/Time:  Wednesday November 05 2022 21:15:45 EDT Ventricular Rate:  90 PR Interval:  186 QRS Duration:  93 QT Interval:  371 QTC Calculation: 454 R Axis:   -39  Text Interpretation: Sinus rhythm Inferior infarct, old Consider anterior infarct No significant change since last tracing Confirmed by Richardean Canal [phone]) on 11/05/2022 9:34:27 PM  Radiology CT CHEST ABDOMEN PELVIS WO CONTRAST  Result Date: 11/05/2022 CLINICAL DATA:  Sepsis, acute kidney injury EXAM: CT CHEST, ABDOMEN AND PELVIS WITHOUT CONTRAST TECHNIQUE: Multidetector CT imaging of the chest, abdomen and pelvis was performed following the standard protocol without IV contrast. RADIATION DOSE REDUCTION: This exam was performed according to the departmental dose-optimization program which includes automated exposure control, adjustment of the mA and/or kV according to patient size and/or use of iterative reconstruction technique. COMPARISON:  CT chest dated 07/31/2022. CT abdomen/pelvis dated 05/06/2022. FINDINGS: CT CHEST FINDINGS Cardiovascular: The heart is normal in size. No pericardial effusion. No evidence of thoracic aortic aneurysm. Mild atherosclerotic calcifications of the arch Moderate) of the LAD and right coronary artery. Mediastinum/Nodes: No suspicious mediastinal lymphadenopathy. Status post right thyroidectomy. Left thyroid is within normal limits. Lungs/Pleura: Evaluation of the lung parenchyma is constrained by respiratory motion. Within that constraint, there are no suspicious pulmonary nodules, noting is stable perifissural lymph node along the right minor fissure (series 6/image 62). Mild dependent atelectasis in the posterior upper lobes. Patchy opacities in the lingula and bilateral lower lobes, some of which reflects chronic scarring, although superimposed atelectasis is favored. Mild focal opacity in the superior segment left lower lobe (series 6/image 58) could  reflect mild pneumonia in the appropriate clinical setting. No pleural effusion or pneumothorax. Musculoskeletal: Visualized osseous structures are within normal limits. CT ABDOMEN PELVIS FINDINGS Hepatobiliary: Unenhanced liver is unremarkable. Status post cholecystectomy. No intrahepatic or extrahepatic ductal dilatation. Pancreas: Within normal limits. Spleen: Within normal limits. Adrenals/Urinary Tract: Adrenal glands are within normal limits. Kidneys are within normal limits.  No hydronephrosis. Bladder is underdistended and poorly evaluated. Stomach/Bowel: Stomach is within normal limits. No evidence of bowel obstruction. Normal appendix (series 2/image 82). No colonic wall thickening or inflammatory changes. Mild to moderate left colonic stool burden, suggesting mild constipation. Vascular/Lymphatic: No evidence of abdominal aortic aneurysm. No suspicious abdominopelvic lymphadenopathy. Reproductive: Status post hysterectomy. No adnexal masses. Other: No abdominopelvic ascites. Musculoskeletal: Visualized osseous structures are within normal limits. IMPRESSION: Lingular and bilateral lower lobe scarring/atelectasis. Superimposed mild left lower lobe pneumonia is possible in the appropriate clinical setting. Mild to moderate left colonic stool burden, suggesting mild constipation. Additional ancillary findings as above. Electronically Signed   By: Charline Bills M.D.   On: 11/05/2022 22:51   DG Chest Port 1 View  Result Date: 11/05/2022 CLINICAL DATA:  Shortness of breath and hypotension.  The EXAM: PORTABLE CHEST 1 VIEW COMPARISON:  AP Lat 09/27/2022 FINDINGS: Exam technically limited due to body habitus. Surgical clips consistent with right hemithyroidectomy are again shown. Stable cardiomegaly. Increased central vascular prominence and mild basilar interstitial edema. Findings most likely due to CHF or fluid overload. There is increased patchy hazy opacity in both lower lung fields, denser on the  left, which could be due to alveolar edema, pneumonia, atelectasis or combination. Small pleural effusions are beginning to form. The mid and upper lungs are generally clear. Stable mediastinum with lipomatosis and aortic atherosclerosis. No new osseous finding. IMPRESSION: 1. Increased central vascular prominence and mild basilar interstitial edema. 2. Increased patchy hazy opacity in both lower lung fields, denser on the left, which could be alveolar edema, pneumonia, atelectasis or combination. 3. Small pleural effusions. 4. Clinical correlation and radiographic follow-up recommended. Electronically Signed   By: Almira Bar M.D.   On: 11/05/2022 22:10    Procedures Procedures   CRITICAL CARE Performed by: Richardean Canal   Total critical care time: 35 minutes  Critical care time was exclusive of separately billable procedures and treating other patients.  Critical care was necessary to treat or prevent imminent or life-threatening deterioration.  Critical care was time spent personally by me on the following activities: development of treatment plan with patient and/or surrogate as well as nursing, discussions with consultants, evaluation of patient's response to treatment, examination of patient, obtaining history from patient or surrogate, ordering and performing treatments and interventions, ordering and review of laboratory studies, ordering and review of radiographic studies, pulse oximetry and re-evaluation of patient's condition.   Medications Ordered in ED Medications  cefTRIAXone (ROCEPHIN) 1 g in sodium chloride 0.9 % 100 mL IVPB (has no administration in time range)  azithromycin (ZITHROMAX) 500 mg in sodium chloride 0.9 % 250 mL IVPB (has no administration in time range)  sodium chloride 0.9 % bolus 1,000 mL (has no administration in time range)  sodium chloride 0.9 % bolus 500 mL (500 mLs Intravenous New Bag/Given 11/05/22 2146)    ED Course/ Medical Decision Making/ A&P                                  Medical Decision Making [name]  is a 67 y.o. female here presenting with shortness of breath and hypotension.  Patient has history of AKI and dehydration with similar symptoms.  Patient has low-grade temp and hypotension.  Concern for possible sepsis.  Plan to get CBC and CMP and lactate and cultures and chest x-ray.   10:59 PM Patient's white blood cell count is normal and COVID is negative.  Lactate is normal but she has an AKI again.  Patient's chest x-ray showed possible pneumonia and CT confirmed pneumonia.  Patient is now hypoxic to 87%.  Her blood pressure improved to the upper 90s.  Patient was given IV antibiotics.  At this point, hospitalist to admit for pneumonia, AKI, possible sepsis.    Problems Addressed: AKI (acute kidney injury) [facility]): acute illness or injury HCAP (healthcare-associated pneumonia): acute illness or injury  Amount and/or Complexity of Data Reviewed Labs: ordered. Decision-making details documented in ED Course. Radiology: ordered and independent interpretation performed. Decision-making details documented in ED Course. ECG/medicine tests: ordered and independent interpretation performed. Decision-making details documented in ED Course.  Risk Decision regarding hospitalization.    Final Clinical Impression(s) / ED Diagnoses Final diagnoses:  None    Rx / DC Orders ED Discharge Orders     None         Charlynne Pander, MD 11/05/22 2300    Charlynne Pander, MD 11/05/22 2303

## 2022-11-05 NOTE — ED Notes (Signed)
Pt was bladder scanned and 0ml of urine was found.

## 2022-11-05 NOTE — H&P (Signed)
History and Physical      [name]:[id number] DOB: [birth year] DOA: 11/05/2022; DOS: 11/05/2022  PCP: Jamey Reas, PA-C *** Patient coming from: home ***  I have personally briefly reviewed patient's old medical records in [facility]  Chief Complaint: ***  HPI: [name] is a 67 y.o. female with medical history significant for *** who is admitted to [facility] on 11/05/2022 with *** after presenting from home*** to [facility] ED complaining of ***.   ***        ***  ED Course:  Vital signs in the ED were notable for the following: ***  Labs were notable for the following: ***  Per my interpretation, EKG in ED demonstrated the following:  ***  Imaging in the ED, per corresponding formal radiology read, was notable for the following: ***  While in the ED, the following were administered: ***  Subsequently, the patient was admitted  ***  ***red   Review of Systems: As per HPI otherwise 10 point review of systems negative.   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    partial   benign cyst  Left    removed from left breast   BREAST BIOPSY Right 02/13/2012   BREAST LUMPECTOMY  03/03/2012   Procedure: LUMPECTOMY;  Surgeon: Currie Paris, MD;  Location: [facility] OR;  Service: General;  Laterality: Right;   BREAST SURGERY Right 01/2012   "cancer"   CHOLECYSTECTOMY  1980's?   COLONOSCOPY WITH PROPOFOL N/A 09/28/2012   Procedure: COLONOSCOPY WITH PROPOFOL;  Surgeon: Shirley Friar, MD;  Location: [facility] ENDOSCOPY;  Service: Endoscopy;  Laterality: N/A;   FOOT FRACTURE SURGERY Right 1990's   JOINT REPLACEMENT      x 3   LESION EXCISION N/A 03/13/2021   Procedure: EXCISION SCALP SEBACEOUS CYSTS x2;  Surgeon: Axel Filler, MD;  Location[facility] OR;  Service: General;  Laterality: N/A;  60 MINUTES LOCAL & MAC   LUMBAR LAMINECTOMY/DECOMPRESSION MICRODISCECTOMY N/A 12/05/2016   Procedure: LEFT L5-S1 MICRODISCECTOMY;  Surgeon: Eldred Manges, MD;  Location: [facility] OR;  Service: Orthopedics;  Laterality: N/A;   REVISION TOTAL KNEE ARTHROPLASTY  07/2009   SHOULDER OPEN ROTATOR CUFF REPAIR Left 1990's   THYROIDECTOMY  1970's   TOTAL KNEE ARTHROPLASTY Bilateral 2009-06/2009   left; right    Social History:  reports that she quit smoking about 30 years ago. Her smoking use  included cigarettes. She started smoking about 37 years ago. She has a 7 pack-year smoking history. She has never used smokeless tobacco. She reports that she does not drink alcohol and does not use drugs.   Allergies  Allergen Reactions   Bee Venom Anaphylaxis   Invokana [Canagliflozin] Anaphylaxis    Dyspnea and urinary retention   Shellfish Allergy Anaphylaxis   Buprenorphine Hcl Other (See Comments)    Overly sedated with morphine drip.   Other Other (See Comments)    Cats - bad asthma, stopped up    Family History  Problem Relation Age of Onset   Heart disease Brother        Multiple MIs, starting in his 2s   Diabetes Brother    Thyroid disease Brother    Hypertension Mother     Diabetes Mother    Breast cancer Mother 68   Bone cancer Mother    Hypertension Sister    Diabetes Sister    Breast cancer Sister 8   Thyroid disease Sister    Breast cancer Maternal Grandmother    Heart disease Maternal Grandmother    Uterine cancer Other 19   Breast cancer Paternal Aunt 2   Breast cancer Paternal Grandmother        dx in her 93s   Prostate cancer Paternal Grandfather    Asthma Son     Family history reviewed and not pertinent ***   Prior to Admission medications   Medication Sig Start Date End Date Taking? Authorizing Provider  MYRBETRIQ 50 MG TB24 tablet Take 50 mg by mouth daily. 10/22/22  Yes [provider]  Olmesartan-amLODIPine-HCTZ 40-10-12.5 MG TABS Take 1 tablet by mouth daily. 10/22/22  Yes [provider]  torsemide (DEMADEX) 20 MG tablet Take 40 mg by mouth daily. 10/22/22  Yes [provider]  valACYclovir (VALTREX) 500 MG tablet Take 500 mg by mouth daily. 10/28/22  Yes [provider]  acetaminophen (TYLENOL) 650 MG CR tablet Take 1,300 mg by mouth in the morning and at bedtime.    [provider]  albuterol (VENTOLIN HFA) 108 (90 Base) MCG/ACT inhaler Inhale 1 puff into the lungs as needed for wheezing or shortness of breath.    [provider]  amLODipine (NORVASC) 5 MG tablet Take 1 tablet (5 mg total) by mouth daily. 09/30/22   Leroy Sea, MD  aspirin EC 81 MG tablet Take 1 tablet (81 mg total) by mouth daily. 08/12/13   Viyuoh, Rolland Bimler, MD  BELBUCA 750 MCG FILM Take 750 mcg by mouth 2 (two) times daily. 07/10/22   [provider]  Blood Glucose Monitoring Suppl (ONETOUCH VERIO REFLECT) w/Device KIT Use as advised 10/13/22   Carlus Pavlov, MD  calcitRIOL (ROCALTROL) 0.25 MCG capsule Take 1 capsule (0.25 mcg total) by mouth 3 (three) times a week. 11/05/22   Carlus Pavlov, MD  Cholecalciferol (VITAMIN D-3) 1000 units CAPS Take 1,000 Units by mouth daily.    [provider]  cyclobenzaprine (FLEXERIL) 5 MG tablet Take 5 mg by mouth daily. 07/10/22   [provider]  desmopressin (DDAVP) 0.2 MG tablet Take 200 mcg by mouth at bedtime.    [provider]  doxepin (SINEQUAN) 50 MG capsule Take 50 mg by mouth at bedtime. 08/25/22   [provider]  esomeprazole (NEXIUM) 40 MG capsule Take 1 capsule (40 mg total) by mouth daily before breakfast. 10/30/14   Vassie Loll, MD  famotidine (PEPCID) 40 MG tablet Take 40  mg by mouth daily after supper. 06/16/20   [provider]  ferrous sulfate 325 (65 FE) MG tablet Take 325 mg by mouth in the morning.    [provider]  glucose blood (ONETOUCH VERIO) test strip TEST BLOOD SUGAR THREE TIMES DAILY 03/12/22   Carlus Pavlov, MD  GNP ULTICARE PEN NEEDLES 32G X 4 MM MISC 1 Needle by Does not apply route 4 (four) times daily. 09/25/22   Carlus Pavlov, MD  Insulin NPH, Human,, Isophane, (HUMULIN N KWIKPEN) 100 UNIT/ML Kiwkpen Inject 20 Units into the skin daily. 10/13/22   Carlus Pavlov, MD  Lancets [facility] DELICA PLUS [place]) MISC 1 each by Other route 3 (three) times daily. Use to check blood sugar 3 times a day 05/23/22   Carlus Pavlov, MD  levothyroxine (SYNTHROID) 200 MCG tablet Take 1 tablet (200 mcg total) by mouth daily before breakfast. 10/15/22   Carlus Pavlov, MD  linaclotide (LINZESS) 290 MCG CAPS capsule Take 290 mcg by mouth daily before breakfast.    [provider]  LORazepam (ATIVAN) 1 MG tablet Take 1 mg by mouth in the morning, at noon, and at bedtime. 09/22/22   [provider]  mirabegron ER (MYRBETRIQ) 25 MG TB24 tablet Take 25 mg by mouth daily.    [provider]  naloxone [facility]) nasal spray 4 mg/0.1 mL Place 1 spray into the nose as needed for opioid reversal. 08/01/20   [provider]  oxyCODONE (OXY IR/ROXICODONE) 5 MG immediate release tablet Take 5 mg by mouth in the morning and at bedtime. 09/22/22   [provider]  polyethylene glycol (MIRALAX / GLYCOLAX) 17 g packet Take 17 g by mouth daily as needed for mild constipation. 08/02/22   Berton Mount I, MD  potassium chloride (KLOR-CON M) 10 MEQ tablet Take 1 tablet (10 mEq total) by mouth daily. 09/30/22   Leroy Sea, MD  pregabalin (LYRICA) 300 MG capsule Take 300 mg by mouth 2 (two) times daily.    [provider]  rosuvastatin (CRESTOR) 20 MG tablet Take 20 mg by mouth daily.    [provider]  sucralfate (CARAFATE) 1 g tablet Take 1 g by mouth daily. 09/13/22   [provider]  SURE COMFORT PEN NEEDLES 32G X 6 MM MISC USE TO INJECT INSULIN FOUR TIMES DAILY 03/13/21   Romero Belling, MD  tolterodine (DETROL LA) 2 MG 24 hr capsule Take 2 mg by mouth daily.    [provider]  torsemide (DEMADEX) 10 MG tablet Take 1 tablet (10 mg total) by mouth daily. 09/30/22   Leroy Sea, MD  triamcinolone cream (KENALOG) 0.1 % Apply 1 Application topically 2 (two) times daily. 09/09/22   [provider]  TRULICITY 4.5 MG/0.5ML SOPN Inject 4.5 mg into the skin once a week. 08/26/22   [provider]  valACYclovir (VALTREX) 1000 MG tablet Take 1,000 mg by mouth daily.    [provider]  venlafaxine XR (EFFEXOR-XR) 150 MG 24 hr capsule Take 150 mg by mouth 2 (two) times daily. 05/21/15   [provider]  Vitamin D, Ergocalciferol, (DRISDOL) 1.25 MG (50000 UNIT) CAPS capsule Take 50,000 Units by mouth once a week. 07/17/22   [provider]  ziprasidone (GEODON) 60 MG capsule Take 60 mg by mouth daily after supper.    [provider]     Objective    Physical Exam: Vitals:   11/05/22 2120 11/05/22 2215 11/05/22 2300 11/05/22 2300  BP:  96/61 110/64   Pulse:  83 83   Resp:  10 12   Temp:    98.2 F (36.8 C)  TempSrc:    Rectal  SpO2:  (!) 87% 98%   Weight: (!) 160.6 kg       General: appears to be stated age; alert, oriented Skin: warm, dry, no  rash Head:  AT/[place] Mouth:  Oral mucosa membranes appear moist, normal dentition Neck: supple; trachea midline Heart:  RRR; did not appreciate any M/R/G Lungs: CTAB, did not appreciate any wheezes, rales, or rhonchi Abdomen: + BS; soft, ND, NT Vascular: 2+ pedal pulses b/l; 2+ radial pulses b/l Extremities: no peripheral edema, no muscle wasting Neuro: strength and sensation intact in upper and lower extremities b/l    *** Neuro: 5/5 strength of the proximal and distal flexors and extensors of the upper and lower extremities bilaterally; sensation intact in upper and lower extremities b/l; cranial nerves II through XII grossly intact; no pronator drift; no evidence suggestive of slurred speech, dysarthria, or facial droop; Normal muscle tone. No tremors. *** Neuro: In the setting of the patient's current mental status and associated inability to follow instructions, unable to perform full neurologic exam at this time.  As such, assessment of strength, sensation, and cranial nerves is limited at this time. Patient noted to spontaneously move all 4 extremities. No tremors.  ***    Labs on Admission: I have personally reviewed following labs and imaging studies  CBC: Recent Labs  Lab 11/05/22 2138 11/05/22 2149  WBC 10.1  --   NEUTROABS 6.7  --   HGB 9.2* 9.9*  HCT 30.2* 29.0*  MCV 87.3  --   PLT 227  --    Basic Metabolic Panel: Recent Labs  Lab 11/05/22 2138 11/05/22 2149  NA 134* 136  K 4.0 4.3  CL 97* 99  CO2 25  --   GLUCOSE 105* 100*  BUN 44* 41*  CREATININE 2.13* 2.90*  CALCIUM 9.1  --    GFR: Estimated Creatinine Clearance: 29.6 mL/min (A) (by C-G formula based on SCr of 2.9 mg/dL (H)). Liver Function Tests: Recent Labs  Lab 11/05/22 2138  AST 15  ALT 16  ALKPHOS 83  BILITOT 0.5  PROT 7.0  ALBUMIN 3.8   No results for input(s): "LIPASE", "AMYLASE" in the last 168 hours. No results for input(s): "AMMONIA" in the last 168 hours. Coagulation  Profile: No results for input(s): "INR", "PROTIME" in the last 168 hours. Cardiac Enzymes: No results for input(s): "CKTOTAL", "CKMB", "CKMBINDEX", "TROPONINI" in the last 168 hours. BNP (last 3 results) No results for input(s): "PROBNP" in the last 8760 hours. HbA1C: No results for input(s): "HGBA1C" in the last 72 hours. CBG: No results for input(s): "GLUCAP" in the last 168 hours. Lipid Profile: No results for input(s): "CHOL", "HDL", "LDLCALC", "TRIG", "CHOLHDL", "LDLDIRECT" in the last 72 hours. Thyroid Function Tests: No results for input(s): "TSH", "T4TOTAL", "FREET4", "T3FREE", "THYROIDAB" in the last 72 hours. Anemia Panel: No results for input(s): "VITAMINB12", "FOLATE", "FERRITIN", "TIBC", "IRON", "RETICCTPCT" in the last 72 hours. Urine analysis:    Component Value Date/Time   COLORURINE YELLOW 09/28/2022 0935   APPEARANCEUR CLEAR 09/28/2022 0935   APPEARANCEUR Clear 11/13/2013 1310   LABSPEC 1.011 09/28/2022 0935   LABSPEC 1.017 11/13/2013 1310   PHURINE 5.0 09/28/2022 0935   GLUCOSEU NEGATIVE 09/28/2022 0935   GLUCOSEU Negative 11/13/2013 1310   GLUCOSEU NEG mg/dL 44/02/270 5366   HGBUR NEGATIVE 09/28/2022 0935   BILIRUBINUR  NEGATIVE 09/28/2022 0935   BILIRUBINUR Negative 11/13/2013 1310   KETONESUR NEGATIVE 09/28/2022 0935   PROTEINUR NEGATIVE 09/28/2022 0935   UROBILINOGEN 0.2 11/12/2014 2057   NITRITE NEGATIVE 09/28/2022 0935   LEUKOCYTESUR NEGATIVE 09/28/2022 0935   LEUKOCYTESUR Trace 11/13/2013 1310    Radiological Exams on Admission: CT CHEST ABDOMEN PELVIS WO CONTRAST  Result Date: 11/05/2022 CLINICAL DATA:  Sepsis, acute kidney injury EXAM: CT CHEST, ABDOMEN AND PELVIS WITHOUT CONTRAST TECHNIQUE: Multidetector CT imaging of the chest, abdomen and pelvis was performed following the standard protocol without IV contrast. RADIATION DOSE REDUCTION: This exam was performed according to the departmental dose-optimization program which includes automated  exposure control, adjustment of the mA and/or kV according to patient size and/or use of iterative reconstruction technique. COMPARISON:  CT chest dated 07/31/2022. CT abdomen/pelvis dated 05/06/2022. FINDINGS: CT CHEST FINDINGS Cardiovascular: The heart is normal in size. No pericardial effusion. No evidence of thoracic aortic aneurysm. Mild atherosclerotic calcifications of the arch Moderate) of the LAD and right coronary artery. Mediastinum/Nodes: No suspicious mediastinal lymphadenopathy. Status post right thyroidectomy. Left thyroid is within normal limits. Lungs/Pleura: Evaluation of the lung parenchyma is constrained by respiratory motion. Within that constraint, there are no suspicious pulmonary nodules, noting is stable perifissural lymph node along the right minor fissure (series 6/image 62). Mild dependent atelectasis in the posterior upper lobes. Patchy opacities in the lingula and bilateral lower lobes, some of which reflects chronic scarring, although superimposed atelectasis is favored. Mild focal opacity in the superior segment left lower lobe (series 6/image 58) could reflect mild pneumonia in the appropriate clinical setting. No pleural effusion or pneumothorax. Musculoskeletal: Visualized osseous structures are within normal limits. CT ABDOMEN PELVIS FINDINGS Hepatobiliary: Unenhanced liver is unremarkable. Status post cholecystectomy. No intrahepatic or extrahepatic ductal dilatation. Pancreas: Within normal limits. Spleen: Within normal limits. Adrenals/Urinary Tract: Adrenal glands are within normal limits. Kidneys are within normal limits.  No hydronephrosis. Bladder is underdistended and poorly evaluated. Stomach/Bowel: Stomach is within normal limits. No evidence of bowel obstruction. Normal appendix (series 2/image 82). No colonic wall thickening or inflammatory changes. Mild to moderate left colonic stool burden, suggesting mild constipation. Vascular/Lymphatic: No evidence of abdominal  aortic aneurysm. No suspicious abdominopelvic lymphadenopathy. Reproductive: Status post hysterectomy. No adnexal masses. Other: No abdominopelvic ascites. Musculoskeletal: Visualized osseous structures are within normal limits. IMPRESSION: Lingular and bilateral lower lobe scarring/atelectasis. Superimposed mild left lower lobe pneumonia is possible in the appropriate clinical setting. Mild to moderate left colonic stool burden, suggesting mild constipation. Additional ancillary findings as above. Electronically Signed   By: Charline Bills M.D.   On: 11/05/2022 22:51   DG Chest Port 1 View  Result Date: 11/05/2022 CLINICAL DATA:  Shortness of breath and hypotension.  The EXAM: PORTABLE CHEST 1 VIEW COMPARISON:  AP Lat 09/27/2022 FINDINGS: Exam technically limited due to body habitus. Surgical clips consistent with right hemithyroidectomy are again shown. Stable cardiomegaly. Increased central vascular prominence and mild basilar interstitial edema. Findings most likely due to CHF or fluid overload. There is increased patchy hazy opacity in both lower lung fields, denser on the left, which could be due to alveolar edema, pneumonia, atelectasis or combination. Small pleural effusions are beginning to form. The mid and upper lungs are generally clear. Stable mediastinum with lipomatosis and aortic atherosclerosis. No new osseous finding. IMPRESSION: 1. Increased central vascular prominence and mild basilar interstitial edema. 2. Increased patchy hazy opacity in both lower lung fields, denser on the left, which could be alveolar edema, pneumonia, atelectasis  or combination. 3. Small pleural effusions. 4. Clinical correlation and radiographic follow-up recommended. Electronically Signed   By: Almira Bar M.D.   On: 11/05/2022 22:10      Assessment/Plan    Principal Problem:   HCAP (healthcare-associated  pneumonia)  ***              ***                ***               ***               ***               ***              ***               ***               ***               ***               ***               ***               ***              ***     DVT prophylaxis: SCD's ***  Code Status: Full code*** Family Communication: none*** Disposition Plan: Per Rounding Team Consults called: none***;  Admission status: ***    I SPENT GREATER THAN 75 *** MINUTES IN CLINICAL CARE TIME/MEDICAL DECISION-MAKING IN COMPLETING THIS ADMISSION.     Chaney Born Harris Penton DO [facility] From 7PM - 7AM   11/05/2022, 11:38 PM   ***

## 2022-11-05 NOTE — ED Notes (Addendum)
In and out cath successful but no urine/ bladder scan showed 0

## 2022-11-06 ENCOUNTER — Encounter (HOSPITAL_COMMUNITY): Payer: Self-pay | Admitting: Internal Medicine

## 2022-11-06 DIAGNOSIS — J189 Pneumonia, unspecified organism: Secondary | ICD-10-CM | POA: Diagnosis present

## 2022-11-06 DIAGNOSIS — N179 Acute kidney failure, unspecified: Secondary | ICD-10-CM | POA: Diagnosis not present

## 2022-11-06 DIAGNOSIS — M5440 Lumbago with sciatica, unspecified side: Secondary | ICD-10-CM | POA: Diagnosis not present

## 2022-11-06 DIAGNOSIS — I10 Essential (primary) hypertension: Secondary | ICD-10-CM | POA: Diagnosis not present

## 2022-11-06 DIAGNOSIS — L8990 Pressure ulcer of unspecified site, unspecified stage: Secondary | ICD-10-CM | POA: Insufficient documentation

## 2022-11-06 LAB — COMPREHENSIVE METABOLIC PANEL
ALT: 15 U/L (ref 0–44)
AST: 13 U/L — ABNORMAL LOW (ref 15–41)
Albumin: 3.4 g/dL — ABNORMAL LOW (ref 3.5–5.0)
Alkaline Phosphatase: 79 U/L (ref 38–126)
Anion gap: 11 (ref 5–15)
BUN: 41 mg/dL — ABNORMAL HIGH (ref 8–23)
CO2: 24 mmol/L (ref 22–32)
Calcium: 8.8 mg/dL — ABNORMAL LOW (ref 8.9–10.3)
Chloride: 100 mmol/L (ref 98–111)
Creatinine, Ser: 2.21 mg/dL — ABNORMAL HIGH (ref 0.44–1.00)
GFR, Estimated: 24 mL/min — ABNORMAL LOW (ref >60)
Glucose, Bld: 101 mg/dL — ABNORMAL HIGH (ref 70–99)
Potassium: 4.5 mmol/L (ref 3.5–5.1)
Sodium: 135 mmol/L (ref 135–145)
Total Bilirubin: 0.5 mg/dL (ref 0.3–1.2)
Total Protein: 6.1 g/dL — ABNORMAL LOW (ref 6.5–8.1)

## 2022-11-06 LAB — CBC WITH DIFFERENTIAL/PLATELET
Abs Immature Granulocytes: 0.2 10*3/uL — ABNORMAL HIGH (ref 0.00–0.07)
Basophils Absolute: 0.1 10*3/uL (ref 0.0–0.1)
Basophils Relative: 1 %
Eosinophils Absolute: 0.3 10*3/uL (ref 0.0–0.5)
Eosinophils Relative: 2 %
HCT: 29.5 % — ABNORMAL LOW (ref 36.0–46.0)
Hemoglobin: 9 g/dL — ABNORMAL LOW (ref 12.0–15.0)
Immature Granulocytes: 2 %
Lymphocytes Relative: 9 %
Lymphs Abs: 1.1 10*3/uL (ref 0.7–4.0)
MCH: 26.4 pg (ref 26.0–34.0)
MCHC: 30.5 g/dL (ref 30.0–36.0)
MCV: 86.5 fL (ref 80.0–100.0)
Monocytes Absolute: 0.7 10*3/uL (ref 0.1–1.0)
Monocytes Relative: 6 %
Neutro Abs: 9.4 10*3/uL — ABNORMAL HIGH (ref 1.7–7.7)
Neutrophils Relative %: 80 %
Platelets: 191 10*3/uL (ref 150–400)
RBC: 3.41 MIL/uL — ABNORMAL LOW (ref 3.87–5.11)
RDW: 17.6 % — ABNORMAL HIGH (ref 11.5–15.5)
WBC: 11.6 10*3/uL — ABNORMAL HIGH (ref 4.0–10.5)
nRBC: 0 % (ref 0.0–0.2)

## 2022-11-06 LAB — SODIUM, URINE, RANDOM: Sodium, Ur: 86 mmol/L

## 2022-11-06 LAB — CREATININE, URINE, RANDOM: Creatinine, Urine: 52 mg/dL

## 2022-11-06 LAB — MAGNESIUM
Magnesium: 2.4 mg/dL (ref 1.7–2.4)
Magnesium: 2.6 mg/dL — ABNORMAL HIGH (ref 1.7–2.4)

## 2022-11-06 LAB — CBG MONITORING, ED
Glucose-Capillary: 82 mg/dL (ref 70–99)
Glucose-Capillary: 77 mg/dL (ref 70–99)
Glucose-Capillary: 85 mg/dL (ref 70–99)
Glucose-Capillary: 81 mg/dL (ref 70–99)

## 2022-11-06 LAB — PHOSPHORUS: Phosphorus: 4.6 mg/dL (ref 2.5–4.6)

## 2022-11-06 LAB — TROPONIN I (HIGH SENSITIVITY): Troponin I (High Sensitivity): 4 ng/L (ref <18)

## 2022-11-06 LAB — GLUCOSE, CAPILLARY
Glucose-Capillary: 77 mg/dL (ref 70–99)
Glucose-Capillary: 136 mg/dL — ABNORMAL HIGH (ref 70–99)

## 2022-11-06 LAB — STREP PNEUMONIAE URINARY ANTIGEN: Strep Pneumo Urinary Antigen: NEGATIVE

## 2022-11-06 LAB — PROCALCITONIN: Procalcitonin: 2.2 ng/mL

## 2022-11-06 LAB — CK: Total CK: 109 U/L (ref 38–234)

## 2022-11-06 MED ORDER — INFLUENZA VAC A&B SURF ANT ADJ 0.5 ML IM SUSY
0.5000 mL | PREFILLED_SYRINGE | INTRAMUSCULAR | Status: AC
  Administered 2022-11-08: 0.5 mL via INTRAMUSCULAR
  Filled 2022-11-06: qty 0.5

## 2022-11-06 MED ORDER — ZIPRASIDONE HCL 40 MG PO CAPS
60.0000 mg | ORAL_CAPSULE | Freq: Every day | ORAL | Status: DC
  Administered 2022-11-06 – 2022-11-07 (×2): 60 mg via ORAL
  Filled 2022-11-06 (×3): qty 1

## 2022-11-06 MED ORDER — LORAZEPAM 1 MG PO TABS
1.0000 mg | ORAL_TABLET | Freq: Three times a day (TID) | ORAL | Status: DC | PRN
  Administered 2022-11-06: 1 mg via ORAL
  Filled 2022-11-06: qty 1

## 2022-11-06 MED ORDER — DOXEPIN HCL 50 MG PO CAPS
50.0000 mg | ORAL_CAPSULE | Freq: Every day | ORAL | Status: DC
  Administered 2022-11-06 – 2022-11-07 (×2): 50 mg via ORAL
  Filled 2022-11-06 (×3): qty 1

## 2022-11-06 MED ORDER — ROSUVASTATIN CALCIUM 10 MG PO TABS
20.0000 mg | ORAL_TABLET | Freq: Every day | ORAL | Status: DC
  Administered 2022-11-06 – 2022-11-08 (×3): 20 mg via ORAL
  Filled 2022-11-06: qty 1
  Filled 2022-11-06 (×2): qty 2

## 2022-11-06 MED ORDER — INSULIN GLARGINE-YFGN 100 UNIT/ML ~~LOC~~ SOLN
7.0000 [IU] | Freq: Every day | SUBCUTANEOUS | Status: DC
  Administered 2022-11-06: 7 [IU] via SUBCUTANEOUS
  Filled 2022-11-06 (×3): qty 0.07

## 2022-11-06 MED ORDER — PANTOPRAZOLE SODIUM 40 MG PO TBEC
40.0000 mg | DELAYED_RELEASE_TABLET | Freq: Every day | ORAL | Status: DC
  Administered 2022-11-06 – 2022-11-08 (×3): 40 mg via ORAL
  Filled 2022-11-06 (×3): qty 1

## 2022-11-06 MED ORDER — FAMOTIDINE 20 MG PO TABS
40.0000 mg | ORAL_TABLET | Freq: Every day | ORAL | Status: DC
  Administered 2022-11-06 – 2022-11-07 (×2): 40 mg via ORAL
  Filled 2022-11-06 (×2): qty 2

## 2022-11-06 MED ORDER — INSULIN ASPART 100 UNIT/ML IJ SOLN
0.0000 [IU] | Freq: Three times a day (TID) | INTRAMUSCULAR | Status: DC
  Administered 2022-11-08: 1 [IU] via SUBCUTANEOUS
  Filled 2022-11-06: qty 0.09

## 2022-11-06 MED ORDER — BUPRENORPHINE HCL 750 MCG BU FILM: 750.0000 ug | ORAL_FILM | Freq: Two times a day (BID) | BUCCAL | Status: DC

## 2022-11-06 MED ORDER — LINACLOTIDE 145 MCG PO CAPS
290.0000 ug | ORAL_CAPSULE | Freq: Every day | ORAL | Status: DC
  Administered 2022-11-07 – 2022-11-08 (×2): 290 ug via ORAL
  Filled 2022-11-06 (×2): qty 2

## 2022-11-06 MED ORDER — BENZONATATE 100 MG PO CAPS: 200.0000 mg | ORAL_CAPSULE | Freq: Three times a day (TID) | ORAL | Status: DC | PRN

## 2022-11-06 MED ORDER — FERROUS SULFATE 325 (65 FE) MG PO TABS
325.0000 mg | ORAL_TABLET | Freq: Every day | ORAL | Status: DC
  Administered 2022-11-06 – 2022-11-08 (×3): 325 mg via ORAL
  Filled 2022-11-06 (×3): qty 1

## 2022-11-06 MED ORDER — PREGABALIN 50 MG PO CAPS
100.0000 mg | ORAL_CAPSULE | Freq: Two times a day (BID) | ORAL | Status: DC
  Administered 2022-11-06 – 2022-11-08 (×4): 100 mg via ORAL
  Filled 2022-11-06 (×4): qty 2

## 2022-11-06 MED ORDER — ASPIRIN 81 MG PO TBEC
81.0000 mg | DELAYED_RELEASE_TABLET | Freq: Every day | ORAL | Status: DC
  Administered 2022-11-06 – 2022-11-08 (×3): 81 mg via ORAL
  Filled 2022-11-06 (×3): qty 1

## 2022-11-06 MED ORDER — LEVOTHYROXINE SODIUM 100 MCG PO TABS
200.0000 ug | ORAL_TABLET | Freq: Every day | ORAL | Status: DC
  Administered 2022-11-06 – 2022-11-08 (×3): 200 ug via ORAL
  Filled 2022-11-06 (×3): qty 2

## 2022-11-06 MED ORDER — VENLAFAXINE HCL ER 150 MG PO CP24
150.0000 mg | ORAL_CAPSULE | Freq: Two times a day (BID) | ORAL | Status: DC
  Administered 2022-11-06 – 2022-11-08 (×4): 150 mg via ORAL
  Filled 2022-11-06 (×4): qty 1

## 2022-11-06 NOTE — Progress Notes (Signed)
PROGRESS NOTE  [name] VQQ:[id number] DOB: 01-07-1956   PCP: Jamey Reas, PA-C  Patient is from: Home.  Lives alone.  Uses wheelchair at baseline.  DOA: 11/05/2022 LOS: 1  Chief complaints Chief Complaint  Patient presents with   Shortness of Breath   Hypotension     Brief Narrative / Interim history: 67 year old F with PMH of morbid obesity, DM-2, CKD-3B, schizophrenia, chronic back pain, anemia, HTN, hypothyroidism, HLD and ambulatory dysfunction with wheelchair dependence presenting with shortness of breath, cough and subjective fever for about 2 days, and admitted for community-acquired pneumonia and AKI. Cr 12.9 (baseline about 1.6).  CT chest/abdomen/pelvis without contrast showed lingular and bilateral lower lobe scarring/atelectasis with superimposed mild LLL pneumonia, mild to moderate left colonic stool burden.  Patient was started on IV fluid for AKI and antibiotics for pneumonia, and admitted.    Subjective: Seen and examined earlier this morning.  No major events overnight of this morning.  She is sleepy but wakes to voice.  She is oriented x 4.  Reports improvement in her breathing.  Feels tired.  Objective: Vitals:   11/06/22 1206 11/06/22 1300 11/06/22 1500 11/06/22 1549  BP: 116/67 112/68 118/63 114/67  Pulse: 76 74 80 79  Resp: 15 11 10 14   Temp: 97.6 F (36.4 C)   98.2 F (36.8 C)  TempSrc: Oral   Oral  SpO2: 100% 99% 100%   Weight:        Examination:  GENERAL: No apparent distress.  Nontoxic. HEENT: MMM.  Vision and hearing grossly intact.  NECK: Supple.  No apparent JVD.  RESP:  No IWOB.  Fair aeration bilaterally but did exam due to body habitus. CVS:  RRR. Heart sounds normal.  ABD/GI/GU: BS+. Abd soft, NTND.  MSK/EXT:  Moves extremities some.  No apparent deformity.  Difficult to assess edema due to body habitus SKIN: no apparent skin lesion or wound NEURO: Sleepy but wakes to voice.  Oriented x 4.  No apparent focal neuro  deficit. PSYCH: Calm. Normal affect.   Procedures:  None  Microbiology summarized: COVID-19 PCR nonreactive Blood cultures NGTD  Assessment and plan: Principal Problem:   CAP (community acquired pneumonia) Active Problems:   Acquired hypothyroidism   Hyperlipidemia   Essential hypertension   Chronic back pain   Anemia of chronic disease   DM2 (diabetes mellitus, type 2) (HCC)   Schizophrenia (HCC)   Acute renal failure superimposed on stage 3b chronic kidney disease (HCC)   Community acquired pneumonia   Pressure injury of skin  Community-acquired pneumonia: Presents with progressive SOB, dry cough and subjective fever.  CT chest/abdomen/pelvis without contrast raises concern for lingular and bibasilar infiltrate, and mild to moderate constipation.  COVID-19 PCR nonreactive.  Blood cultures NGTD.  Procalcitonin elevated. -Continue ceftriaxone and Zithromax for community-acquired pneumonia -Mucolytic's, antitussives and nebs as needed  AKI on CKD-3B: Baseline Cr about 1.6 but previously as high as 4.8.  She is on Benicar/HCT and Demadex. Recent Labs    07/31/22 0853 08/01/22 0234 08/02/22 0130 09/27/22 2302 09/28/22 0405 09/29/22 0236 09/30/22 0604 11/05/22 2138 11/05/22 2149 11/06/22 0450  BUN 50* 41* 35* 126* 124* 93* 64* 44* 41* 41*  CREATININE 2.15* 1.83* 1.65* 4.84* 4.34* 2.73* 1.66* 2.13* 2.90* 2.21*  -Continue holding ACE inhibitor's and diuretics -Recheck renal function in the morning  DM-2 with hyperlipidemia and CKD-3B: A1c 6.2% in 09/2022.  CBG within normal range. Recent Labs  Lab 11/06/22 0803 11/06/22 4332 11/06/22 1207 11/06/22 1309  GLUCAP 82 77 85 81  -Continue current insulin regimen for now.  May discontinue if CBG remains stable.   Essential hypertension: Normotensive off home antihypertensive meds. -Continue holding home meds  Chronic back pain: On significant dose of Belbuca, Lyrica, oxycodone.  Last filled on 9/5. She is awake but not  quite alert.  She is at risk for polypharmacy and CO2 retention. -Continue home Belbuca and Lyrica. -Continue holding oxycodone and Flexeril  Hypothyroidism -Continue home Synthroid  Anemia of chronic disease: Baseline Hgb about 9.  Stable -Continue monitoring  Overactive bladder -Continue home meds.  Physical deconditioning/ambulatory dysfunction: Wheelchair dependent at baseline. -PT/OT consult  Mood disorder/history of schizophrenia: Stable. -Continue home Geodon and Ativan.  At risk for polypharmacy: On multiple sedating medication -This needs to be reviewed outpatient  Morbid obesity Body mass index is 58.91 kg/m.  Pressure skin injury: POA Pressure Injury 11/06/22 Sacrum Stage 1 -  Intact skin with non-blanchable redness of a localized area usually over a bony prominence. (Active)  11/06/22 0100  Location: Sacrum  Location Orientation:   Staging: Stage 1 -  Intact skin with non-blanchable redness of a localized area usually over a bony prominence.  Wound Description (Comments):   Present on Admission: Yes   DVT prophylaxis:  SCDs Start: 11/05/22 2336  Code Status: Full code Family Communication: None at bedside Level of care: Med-Surg Status is: Inpatient Remains inpatient appropriate because: Community-acquired pneumonia   Final disposition: TBD Consultants:  None  55 minutes with more than 50% spent in reviewing records, counseling patient/family and coordinating care.   Sch Meds:  Scheduled Meds:  aspirin EC  81 mg Oral Daily   Buprenorphine HCl  750 mcg Oral BID   famotidine  40 mg Oral QPC supper   ferrous sulfate  325 mg Oral Daily   insulin aspart  0-9 Units Subcutaneous TID WC   insulin glargine-yfgn  7 Units Subcutaneous Daily   levothyroxine  200 mcg Oral Q0600   pantoprazole  40 mg Oral Daily   rosuvastatin  20 mg Oral Daily   ziprasidone  60 mg Oral QPC supper   Continuous Infusions:  azithromycin     cefTRIAXone (ROCEPHIN)  IV      PRN Meds:.acetaminophen **OR** acetaminophen, benzonatate, LORazepam, melatonin, ondansetron (ZOFRAN) IV  Antimicrobials: Anti-infectives (From admission, onward)    Start     Dose/Rate Route Frequency Ordered Stop   11/06/22 1800  azithromycin (ZITHROMAX) 500 mg in sodium chloride 0.9 % 250 mL IVPB        500 mg 250 mL/hr over 60 Minutes Intravenous Every 24 hours 11/05/22 2337     11/06/22 1800  cefTRIAXone (ROCEPHIN) 1 g in sodium chloride 0.9 % 100 mL IVPB        1 g 200 mL/hr over 30 Minutes Intravenous Every 24 hours 11/05/22 2337     11/05/22 2215  cefTRIAXone (ROCEPHIN) 1 g in sodium chloride 0.9 % 100 mL IVPB        1 g 200 mL/hr over 30 Minutes Intravenous  Once 11/05/22 2213 11/05/22 2325   11/05/22 2215  azithromycin (ZITHROMAX) 500 mg in sodium chloride 0.9 % 250 mL IVPB        500 mg 250 mL/hr over 60 Minutes Intravenous  Once 11/05/22 2213 11/06/22 0100        I have personally reviewed the following labs and images: CBC: Recent Labs  Lab 11/05/22 2138 11/05/22 2149 11/06/22 0450  WBC 10.1  --  11.6*  NEUTROABS 6.7  --  9.4*  HGB 9.2* 9.9* 9.0*  HCT 30.2* 29.0* 29.5*  MCV 87.3  --  86.5  PLT 227  --  191   BMP &GFR Recent Labs  Lab 11/05/22 2138 11/05/22 2149 11/05/22 2335 11/06/22 0450  NA 134* 136  --  135  K 4.0 4.3  --  4.5  CL 97* 99  --  100  CO2 25  --   --  24  GLUCOSE 105* 100*  --  101*  BUN 44* 41*  --  41*  CREATININE 2.13* 2.90*  --  2.21*  CALCIUM 9.1  --   --  8.8*  MG  --   --  2.6* 2.4  PHOS  --   --   --  4.6   Estimated Creatinine Clearance: 38.9 mL/min (A) (by C-G formula based on SCr of 2.21 mg/dL (H)). Liver & Pancreas: Recent Labs  Lab 11/05/22 2138 11/06/22 0450  AST 15 13*  ALT 16 15  ALKPHOS 83 79  BILITOT 0.5 0.5  PROT 7.0 6.1*  ALBUMIN 3.8 3.4*   No results for input(s): "LIPASE", "AMYLASE" in the last 168 hours. No results for input(s): "AMMONIA" in the last 168 hours. Diabetic: No results for  input(s): "HGBA1C" in the last 72 hours. Recent Labs  Lab 11/06/22 0803 11/06/22 0937 11/06/22 1207 11/06/22 1309  GLUCAP 82 77 85 81   Cardiac Enzymes: Recent Labs  Lab 11/05/22 2335  CKTOTAL 109   No results for input(s): "PROBNP" in the last 8760 hours. Coagulation Profile: No results for input(s): "INR", "PROTIME" in the last 168 hours. Thyroid Function Tests: No results for input(s): "TSH", "T4TOTAL", "FREET4", "T3FREE", "THYROIDAB" in the last 72 hours. Lipid Profile: No results for input(s): "CHOL", "HDL", "LDLCALC", "TRIG", "CHOLHDL", "LDLDIRECT" in the last 72 hours. Anemia Panel: No results for input(s): "VITAMINB12", "FOLATE", "FERRITIN", "TIBC", "IRON", "RETICCTPCT" in the last 72 hours. Urine analysis:    Component Value Date/Time   COLORURINE YELLOW 09/28/2022 0935   APPEARANCEUR CLEAR 09/28/2022 0935   APPEARANCEUR Clear 11/13/2013 1310   LABSPEC 1.011 09/28/2022 0935   LABSPEC 1.017 11/13/2013 1310   PHURINE 5.0 09/28/2022 0935   GLUCOSEU NEGATIVE 09/28/2022 0935   GLUCOSEU Negative 11/13/2013 1310   GLUCOSEU NEG mg/dL 16/11/9602 5409   HGBUR NEGATIVE 09/28/2022 0935   BILIRUBINUR NEGATIVE 09/28/2022 0935   BILIRUBINUR Negative 11/13/2013 1310   KETONESUR NEGATIVE 09/28/2022 0935   PROTEINUR NEGATIVE 09/28/2022 0935   UROBILINOGEN 0.2 11/12/2014 2057   NITRITE NEGATIVE 09/28/2022 0935   LEUKOCYTESUR NEGATIVE 09/28/2022 0935   LEUKOCYTESUR Trace 11/13/2013 1310   Sepsis Labs: Invalid input(s): "PROCALCITONIN", "LACTICIDVEN"  Microbiology: Recent Results (from the past 240 hour(s))  Blood culture (routine x 2)     Status: None (Preliminary result)   Collection Time: 11/05/22  9:38 PM   Specimen: BLOOD  Result Value Ref Range Status   Specimen Description   Final    BLOOD LEFT ANTECUBITAL Performed at [facility], [address], [place], [place] [address]    Special Requests   Final    BOTTLES DRAWN AEROBIC AND ANAEROBIC  Blood Culture adequate volume Performed at [facility], [address], [place], [place] [address]    Culture   Final    NO GROWTH < 12 HOURS Performed at [facility], [address], [place], [place] [address]    Report Status PENDING  Incomplete  SARS Coronavirus 2 by RT PCR (  hospital order, performed in [facility] hospital lab) *cepheid single result test* Anterior Nasal Swab     Status: None   Collection Time: 11/05/22  9:48 PM   Specimen: Anterior Nasal Swab  Result Value Ref Range Status   SARS Coronavirus 2 by RT PCR NEGATIVE NEGATIVE Final    Comment: (NOTE) SARS-CoV-2 target nucleic acids are NOT DETECTED.  The SARS-CoV-2 RNA is generally detectable in upper and lower respiratory specimens during the acute phase of infection. The lowest concentration of SARS-CoV-2 viral copies this assay can detect is 250 copies / mL. A negative result does not preclude SARS-CoV-2 infection and should not be used as the sole basis for treatment or other patient management decisions.  A negative result may occur with improper specimen collection / handling, submission of specimen other than nasopharyngeal swab, presence of viral mutation(s) within the areas targeted by this assay, and inadequate number of viral copies (<250 copies / mL). A negative result must be combined with clinical observations, patient history, and epidemiological information.  Fact Sheet for Patients:   RoadLapTop.co.za  Fact Sheet for Healthcare Providers: http://kim-miller.com/  This test is not yet approved or  cleared by the [place] FDA and has been authorized for detection and/or diagnosis of SARS-CoV-2 by FDA under an Emergency Use Authorization (EUA).  This EUA will remain in effect (meaning this test can be used) for the duration of the COVID-19 declaration under Section 564(b)(1) of the Act, 21 U.S.C. section 360bbb-3(b)(1),  unless the authorization is terminated or revoked sooner.  Performed at [facility], [address] [place], [place] [address]   Blood culture (routine x 2)     Status: None (Preliminary result)   Collection Time: 11/05/22 11:35 PM   Specimen: BLOOD  Result Value Ref Range Status   Specimen Description   Final    BLOOD BLOOD LEFT FOREARM Performed at [facility], [address], [place], [place] [address]    Special Requests   Final    BOTTLES DRAWN AEROBIC AND ANAEROBIC Blood Culture results may not be optimal due to an inadequate volume of blood received in culture bottles Performed at [facility], [address] [place], [place] [address]    Culture   Final    NO GROWTH < 12 HOURS Performed at [facility], [address], [place], [place] [address]    Report Status PENDING  Incomplete    Radiology Studies: CT CHEST ABDOMEN PELVIS WO CONTRAST  Result Date: 11/05/2022 CLINICAL DATA:  Sepsis, acute kidney injury EXAM: CT CHEST, ABDOMEN AND PELVIS WITHOUT CONTRAST TECHNIQUE: Multidetector CT imaging of the chest, abdomen and pelvis was performed following the standard protocol without IV contrast. RADIATION DOSE REDUCTION: This exam was performed according to the departmental dose-optimization program which includes automated exposure control, adjustment of the mA and/or kV according to patient size and/or use of iterative reconstruction technique. COMPARISON:  CT chest dated 07/31/2022. CT abdomen/pelvis dated 05/06/2022. FINDINGS: CT CHEST FINDINGS Cardiovascular: The heart is normal in size. No pericardial effusion. No evidence of thoracic aortic aneurysm. Mild atherosclerotic calcifications of the arch Moderate) of the LAD and right coronary artery. Mediastinum/Nodes: No suspicious mediastinal lymphadenopathy. Status post right thyroidectomy. Left thyroid is within normal limits. Lungs/Pleura: Evaluation of the lung  parenchyma is constrained by respiratory motion. Within that constraint, there are no suspicious pulmonary nodules, noting is stable perifissural lymph node along the right minor fissure (series 6/image 62). Mild dependent atelectasis  in the posterior upper lobes. Patchy opacities in the lingula and bilateral lower lobes, some of which reflects chronic scarring, although superimposed atelectasis is favored. Mild focal opacity in the superior segment left lower lobe (series 6/image 58) could reflect mild pneumonia in the appropriate clinical setting. No pleural effusion or pneumothorax. Musculoskeletal: Visualized osseous structures are within normal limits. CT ABDOMEN PELVIS FINDINGS Hepatobiliary: Unenhanced liver is unremarkable. Status post cholecystectomy. No intrahepatic or extrahepatic ductal dilatation. Pancreas: Within normal limits. Spleen: Within normal limits. Adrenals/Urinary Tract: Adrenal glands are within normal limits. Kidneys are within normal limits.  No hydronephrosis. Bladder is underdistended and poorly evaluated. Stomach/Bowel: Stomach is within normal limits. No evidence of bowel obstruction. Normal appendix (series 2/image 82). No colonic wall thickening or inflammatory changes. Mild to moderate left colonic stool burden, suggesting mild constipation. Vascular/Lymphatic: No evidence of abdominal aortic aneurysm. No suspicious abdominopelvic lymphadenopathy. Reproductive: Status post hysterectomy. No adnexal masses. Other: No abdominopelvic ascites. Musculoskeletal: Visualized osseous structures are within normal limits. IMPRESSION: Lingular and bilateral lower lobe scarring/atelectasis. Superimposed mild left lower lobe pneumonia is possible in the appropriate clinical setting. Mild to moderate left colonic stool burden, suggesting mild constipation. Additional ancillary findings as above. Electronically Signed   By: Charline Bills M.D.   On: 11/05/2022 22:51   DG Chest Port 1  View  Result Date: 11/05/2022 CLINICAL DATA:  Shortness of breath and hypotension.  The EXAM: PORTABLE CHEST 1 VIEW COMPARISON:  AP Lat 09/27/2022 FINDINGS: Exam technically limited due to body habitus. Surgical clips consistent with right hemithyroidectomy are again shown. Stable cardiomegaly. Increased central vascular prominence and mild basilar interstitial edema. Findings most likely due to CHF or fluid overload. There is increased patchy hazy opacity in both lower lung fields, denser on the left, which could be due to alveolar edema, pneumonia, atelectasis or combination. Small pleural effusions are beginning to form. The mid and upper lungs are generally clear. Stable mediastinum with lipomatosis and aortic atherosclerosis. No new osseous finding. IMPRESSION: 1. Increased central vascular prominence and mild basilar interstitial edema. 2. Increased patchy hazy opacity in both lower lung fields, denser on the left, which could be alveolar edema, pneumonia, atelectasis or combination. 3. Small pleural effusions. 4. Clinical correlation and radiographic follow-up recommended. Electronically Signed   By: Almira Bar M.D.   On: 11/05/2022 22:10      Carston Riedl T. [facility]  If 7PM-7AM, please contact night-coverage [email or web address] 11/06/2022, 4:06 PM

## 2022-11-06 NOTE — ED Notes (Signed)
ED TO INPATIENT HANDOFF REPORT  Name/Age/Gender [name] 67 y.o. female  Code Status    Code Status Orders  (From admission, onward)           Start     Ordered   11/05/22 2336  Full code  Continuous       Question:  By:  Answer:  Consent: discussion documented in EHR   11/05/22 2335           Code Status History     Date Active Date Inactive Code Status Order ID Comments User Context   09/28/2022 0349 09/30/2022 1938 Full Code [id number]  Gery Pray, MD ED   07/31/2022 0024 08/02/2022 2136 Full Code [id number]  Gery Pray, MD ED   07/22/2022 1811 07/26/2022 0204 Full Code [id number]  Danford, Earl Lites, MD ED   08/31/2021 1127 09/13/2021 2259 Full Code [id number]  Jonah Blue, MD ED   09/29/2020 1532 10/06/2020 1658 Full Code [id number]  Minor, Vilinda Blanks, NP ED   12/05/2016 1443 12/06/2016 1440 Full Code [id number]  Naida Sleight, PA-C Inpatient   04/23/2016 1944 04/25/2016 1935 Full Code [id number]  Maretta Bees, MD ED   03/28/2016 0017 04/07/2016 1919 Full Code [id number]  Jonah Blue, MD ED   02/29/2016 1707 03/03/2016 2015 Partial Code [id number]  Leatha Gilding, MD ED   02/13/2016 2349 02/17/2016 2142 Full Code [id number]  Michael Litter, MD Inpatient   06/23/2015 1210 06/27/2015 2252 Full Code [id number]  Marcos Eke, PA-C ED   11/12/2014 2200 11/15/2014 1654 Full Code [id number]  Lorretta Harp, MD ED   10/28/2014 0502 10/30/2014 2146 Full Code [id number]  Ron Parker, MD ED   08/14/2014 0709 08/16/2014 1550 Full Code [id number]  Rolly Salter, MD ED   01/06/2014 1634 01/07/2014 1505 DNR [id number]  Zannie Cove, MD Inpatient   08/08/2013 1943 08/12/2013 2044 Full Code [id number]  Oretha Milch, MD ED   06/04/2011 0336 06/05/2011 1923 Full Code [id number]  Cory Roughen, RN Inpatient       Home/SNF/Other Home  Chief Complaint HCAP (healthcare-associated pneumonia) [J18.9] Community acquired pneumonia [J18.9]  Level of Care/Admitting Diagnosis ED  Disposition     ED Disposition  Admit   Condition  --   Comment  Hospital Area: [facility] [[id number]]  Level of Care: Med-Surg [16]  May admit patient to [facility] or [facility] if equivalent level of care is available:: No  Covid Evaluation: Confirmed COVID Negative  Diagnosis: Community acquired pneumonia [101751]  Admitting Physician: Almon Hercules [[id number]]  Attending Physician: Almon Hercules [id number]  Certification:: I certify this patient will need inpatient services for at least 2 midnights          Medical History Past Medical History:  Diagnosis Date   Anemia    Anxiety and depression    Asthma    Breast cancer (HCC) 02/13/2012   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   Other Other (See Comments)    Cats - bad asthma, stopped up    IV Location/Drains/Wounds Patient Lines/Drains/Airways Status     Active Line/Drains/Airways     Name Placement date Placement time Site Days   Peripheral IV 11/05/22 20 G Right Antecubital  11/05/22  2131  Antecubital  1   External Urinary Catheter 11/05/22  2250  --  1   Pressure Injury 11/06/22 Sacrum Stage 1 -  Intact skin with non-blanchable redness of a localized area usually over a bony prominence. 11/06/22  0100  -- less than 1   Wound / Incision (Open or Dehisced) 07/24/22 Irritant Dermatitis (Moisture Associated Skin Damage) Buttocks Right;Left moisture associaterd skin damage to bilat buttocks 07/24/22  --  Buttocks  105   Wound / Incision (Open or Dehisced) 07/24/22 Other (Comment) Toe (Comment  which one) Right black dry scab to right 4th toe 07/24/22  --  Toe (Comment  which one)  105            Labs/Imaging Results for orders placed or performed during the hospital encounter of 11/05/22 (from the past 48 hour(s))  CBC with Differential     Status: Abnormal   Collection Time: 11/05/22  9:38 PM  Result Value Ref Range   WBC 10.1 4.0 - 10.5 K/uL   RBC 3.46 (L) 3.87 - 5.11 MIL/uL   Hemoglobin 9.2 (L) 12.0 - 15.0 g/dL   HCT 78.2 (L) 95.6 - 21.3 %   MCV 87.3 80.0 - 100.0 fL   MCH 26.6 26.0 - 34.0 pg   MCHC 30.5 30.0 - 36.0 g/dL   RDW 08.6 (H) 57.8 - 46.9 %   Platelets 227 150 - 400 K/uL   nRBC 0.0 0.0 - 0.2 %   Neutrophils Relative % 66 %   Neutro Abs 6.7 1.7 - 7.7 K/uL   Lymphocytes Relative 21 %   Lymphs Abs 2.2 0.7 - 4.0 K/uL   Monocytes Relative 4 %   Monocytes Absolute 0.4 0.1 - 1.0 K/uL   Eosinophils Relative 7 %   Eosinophils Absolute 0.7 (H) 0.0 - 0.5 K/uL   Basophils Relative 1 %   Basophils Absolute 0.1 0.0 - 0.1 K/uL   Immature Granulocytes 1 %   Abs Immature Granulocytes 0.08 (H) 0.00 - 0.07 K/uL    Comment: Performed at [facility], [address] [place], [place] [address]  Comprehensive metabolic panel     Status: Abnormal   Collection Time: 11/05/22  9:38 PM  Result Value Ref Range   Sodium 134 (L) 135 - 145 mmol/L   Potassium 4.0 3.5 - 5.1 mmol/L   Chloride 97 (L) 98 - 111 mmol/L   CO2 25 22 - 32 mmol/L    Glucose, Bld 105 (H) 70 - 99 mg/dL    Comment: Glucose reference range applies only to samples taken after fasting for at least 8 hours.   BUN 44 (H)  8 - 23 mg/dL   Creatinine, Ser 7.62 (H) 0.44 - 1.00 mg/dL   Calcium 9.1 8.9 - 83.1 mg/dL   Total Protein 7.0 6.5 - 8.1 g/dL   Albumin 3.8 3.5 - 5.0 g/dL   AST 15 15 - 41 U/L   ALT 16 0 - 44 U/L   Alkaline Phosphatase 83 38 - 126 U/L   Total Bilirubin 0.5 0.3 - 1.2 mg/dL   GFR, Estimated 25 (L) >60 mL/min    Comment: (NOTE) Calculated using the CKD-EPI Creatinine Equation (2021)    Anion gap 12 5 - 15    Comment: Performed at [facility], [address] [place], [place] [address]  Brain natriuretic peptide     Status: None   Collection Time: 11/05/22  9:38 PM  Result Value Ref Range   B Natriuretic Peptide 26.0 0.0 - 100.0 pg/mL    Comment: Performed at [facility], [address], [place], [place] [address]  Troponin I (High Sensitivity)     Status: None   Collection Time: 11/05/22  9:38 PM  Result Value Ref Range   Troponin I (High Sensitivity) 4 <18 ng/L    Comment: (NOTE) Elevated high sensitivity troponin I (hsTnI) values and significant  changes across serial measurements may suggest ACS but many other  chronic and acute conditions are known to elevate hsTnI results.  Refer to the "Links" section for chest pain algorithms and additional  guidance. Performed at [facility], [address] [place], [place] [address]   Blood culture (routine x 2)     Status: None (Preliminary result)   Collection Time: 11/05/22  9:38 PM   Specimen: BLOOD  Result Value Ref Range   Specimen Description      BLOOD LEFT ANTECUBITAL Performed at [facility], [address] [place], [place] [address]    Special Requests      BOTTLES DRAWN AEROBIC AND ANAEROBIC Blood Culture adequate volume Performed at [facility], [address],  [place], [place] [address]    Culture      NO GROWTH < 12 HOURS Performed at [facility], [address], [place], [place] [address]    Report Status PENDING   SARS Coronavirus 2 by RT PCR (hospital order, performed in [facility] hospital lab) *cepheid single result test* Anterior Nasal Swab     Status: None   Collection Time: 11/05/22  9:48 PM   Specimen: Anterior Nasal Swab  Result Value Ref Range   SARS Coronavirus 2 by RT PCR NEGATIVE NEGATIVE    Comment: (NOTE) SARS-CoV-2 target nucleic acids are NOT DETECTED.  The SARS-CoV-2 RNA is generally detectable in upper and lower respiratory specimens during the acute phase of infection. The lowest concentration of SARS-CoV-2 viral copies this assay can detect is 250 copies / mL. A negative result does not preclude SARS-CoV-2 infection and should not be used as the sole basis for treatment or other patient management decisions.  A negative result may occur with improper specimen collection / handling, submission of specimen other than nasopharyngeal swab, presence of viral mutation(s) within the areas targeted by this assay, and inadequate number of viral copies (<250 copies / mL). A negative result must be combined with clinical observations, patient history, and epidemiological information.  Fact Sheet for Patients:   RoadLapTop.co.za  Fact Sheet for Healthcare Providers: http://kim-miller.com/  This test is not yet approved or  cleared by the [place]  FDA and has been authorized for detection and/or diagnosis of SARS-CoV-2 by FDA under an Emergency Use Authorization (EUA).  This EUA will remain in effect (meaning this test can be used) for the duration of the COVID-19 declaration under Section 564(b)(1) of the Act, 21 U.S.C. section 360bbb-3(b)(1), unless the authorization is terminated or revoked sooner.  Performed at [facility], [address], [place], [place] [address]   I-stat chem 8, ED (not at [facility] or [facility])     Status: Abnormal   Collection Time: 11/05/22  9:49 PM  Result Value Ref Range   Sodium 136 135 - 145 mmol/L   Potassium 4.3 3.5 - 5.1 mmol/L   Chloride 99 98 - 111 mmol/L   BUN 41 (H) 8 - 23 mg/dL   Creatinine, Ser 6.44 (H) 0.44 - 1.00 mg/dL   Glucose, Bld 034 (H) 70 - 99 mg/dL    Comment: Glucose reference range applies only to samples taken after fasting for at least 8 hours.   Calcium, Ion 1.18 1.15 - 1.40 mmol/L   TCO2 27 22 - 32 mmol/L   Hemoglobin 9.9 (L) 12.0 - 15.0 g/dL   HCT 74.2 (L) 59.5 - 63.8 %  I-Stat CG4 Lactic Acid     Status: None   Collection Time: 11/05/22  9:49 PM  Result Value Ref Range   Lactic Acid, Venous 1.7 0.5 - 1.9 mmol/L  Blood culture (routine x 2)     Status: None (Preliminary result)   Collection Time: 11/05/22 11:35 PM   Specimen: BLOOD  Result Value Ref Range   Specimen Description      BLOOD BLOOD LEFT FOREARM Performed at [facility], [address], [place], [place] [address]    Special Requests      BOTTLES DRAWN AEROBIC AND ANAEROBIC Blood Culture results may not be optimal due to an inadequate volume of blood received in culture bottles Performed at [facility], [address], [place], [place] [address]    Culture      NO GROWTH < 12 HOURS Performed at [facility], [address] [place], [place] [address]    Report Status PENDING   Troponin I (High Sensitivity)     Status: None   Collection Time: 11/05/22 11:35 PM  Result Value Ref Range   Troponin I (High Sensitivity) 4 <18 ng/L    Comment: (NOTE) Elevated high sensitivity troponin I (hsTnI) values and significant  changes across serial measurements may suggest ACS but many other  chronic and acute conditions are known to elevate hsTnI results.  Refer to the "Links" section for chest pain algorithms and additional  guidance. Performed at [facility], [address] [place], [place] [address]   Magnesium     Status: Abnormal   Collection Time: 11/05/22 11:35 PM  Result Value Ref Range   Magnesium 2.6 (H) 1.7 - 2.4 mg/dL    Comment: Performed at [facility], [address], [place], [place] [address]  Procalcitonin     Status: None   Collection Time: 11/05/22 11:35 PM  Result Value Ref Range   Procalcitonin 2.20 ng/mL    Comment:        Interpretation: PCT > 2 ng/mL: Systemic infection (sepsis) is likely, unless other causes are known. (NOTE)       Sepsis PCT Algorithm           Lower Respiratory Tract  Infection PCT Algorithm    ----------------------------     ----------------------------         PCT < 0.25 ng/mL                PCT < 0.10 ng/mL          Strongly encourage             Strongly discourage   discontinuation of antibiotics    initiation of antibiotics    ----------------------------     -----------------------------       PCT 0.25 - 0.50 ng/mL            PCT 0.10 - 0.25 ng/mL               OR       >80% decrease in PCT            Discourage initiation of                                            antibiotics      Encourage discontinuation           of antibiotics    ----------------------------     -----------------------------         PCT >= 0.50 ng/mL              PCT 0.26 - 0.50 ng/mL               AND       <80% decrease in PCT              Encourage initiation of                                             antibiotics       Encourage continuation           of antibiotics    ----------------------------     -----------------------------        PCT >= 0.50 ng/mL                  PCT > 0.50 ng/mL               AND         increase in PCT                  Strongly encourage                                      initiation of antibiotics    Strongly encourage escalation           of antibiotics                                      -----------------------------                                           PCT <= 0.25 ng/mL  OR                                        > 80% decrease in PCT                                      Discontinue / Do not initiate                                             antibiotics  Performed at [facility], [address] [place], [place] [address]   CK     Status: None   Collection Time: 11/05/22 11:35 PM  Result Value Ref Range   Total CK 109 38 - 234 U/L    Comment: Performed at [facility], [address], [place], [place] [address]  Strep pneumoniae urinary antigen     Status: None   Collection Time: 11/06/22  4:41 AM  Result Value Ref Range   Strep Pneumo Urinary Antigen NEGATIVE NEGATIVE    Comment:        Infection due to S. pneumoniae cannot be absolutely ruled out since the antigen present may be below the detection limit of the test. Performed at [facility], [address] [place], [place] [address]   Sodium, urine, random     Status: None   Collection Time: 11/06/22  4:41 AM  Result Value Ref Range   Sodium, Ur 86 mmol/L    Comment: Performed at [facility], [address] [place], [place] [address]  Creatinine, urine, random     Status: None   Collection Time: 11/06/22  4:41 AM  Result Value Ref Range   Creatinine, Urine 52 mg/dL    Comment: Performed at [facility], [address], [place], [place] [address]  Comprehensive metabolic panel     Status: Abnormal   Collection Time: 11/06/22  4:50 AM  Result Value Ref Range   Sodium 135 135 - 145 mmol/L   Potassium 4.5 3.5 - 5.1 mmol/L   Chloride 100 98 - 111 mmol/L   CO2 24 22 - 32 mmol/L   Glucose, Bld 101 (H) 70 - 99 mg/dL    Comment: Glucose reference range applies only to samples taken after fasting for at least 8 hours.   BUN 41 (H) 8 - 23 mg/dL   Creatinine, Ser 5.28 (H)  0.44 - 1.00 mg/dL   Calcium 8.8 (L) 8.9 - 10.3 mg/dL   Total Protein 6.1 (L) 6.5 - 8.1 g/dL   Albumin 3.4 (L) 3.5 - 5.0 g/dL   AST 13 (L) 15 - 41 U/L   ALT 15 0 - 44 U/L   Alkaline Phosphatase 79 38 - 126 U/L   Total Bilirubin 0.5 0.3 - 1.2 mg/dL   GFR, Estimated 24 (L) >60 mL/min    Comment: (NOTE) Calculated using the CKD-EPI Creatinine Equation (2021)    Anion gap 11 5 - 15    Comment: Performed at [facility], [address] [place], [place] [address]  Magnesium     Status: None   Collection Time: 11/06/22  4:50 AM  Result Value Ref Range  Magnesium 2.4 1.7 - 2.4 mg/dL    Comment: Performed at [facility], [address] [place], [place] [address]  Phosphorus     Status: None   Collection Time: 11/06/22  4:50 AM  Result Value Ref Range   Phosphorus 4.6 2.5 - 4.6 mg/dL    Comment: Performed at [facility], [address], [place], [place] [address]  CBC with Differential/Platelet     Status: Abnormal   Collection Time: 11/06/22  4:50 AM  Result Value Ref Range   WBC 11.6 (H) 4.0 - 10.5 K/uL   RBC 3.41 (L) 3.87 - 5.11 MIL/uL   Hemoglobin 9.0 (L) 12.0 - 15.0 g/dL   HCT 56.3 (L) 87.5 - 64.3 %   MCV 86.5 80.0 - 100.0 fL   MCH 26.4 26.0 - 34.0 pg   MCHC 30.5 30.0 - 36.0 g/dL   RDW 32.9 (H) 51.8 - 84.1 %   Platelets 191 150 - 400 K/uL   nRBC 0.0 0.0 - 0.2 %   Neutrophils Relative % 80 %   Neutro Abs 9.4 (H) 1.7 - 7.7 K/uL   Lymphocytes Relative 9 %   Lymphs Abs 1.1 0.7 - 4.0 K/uL   Monocytes Relative 6 %   Monocytes Absolute 0.7 0.1 - 1.0 K/uL   Eosinophils Relative 2 %   Eosinophils Absolute 0.3 0.0 - 0.5 K/uL   Basophils Relative 1 %   Basophils Absolute 0.1 0.0 - 0.1 K/uL   Immature Granulocytes 2 %   Abs Immature Granulocytes 0.20 (H) 0.00 - 0.07 K/uL    Comment: Performed at [facility], [address], [place], [place] [address]  CBG monitoring, ED     Status: None   Collection  Time: 11/06/22  8:03 AM  Result Value Ref Range   Glucose-Capillary 82 70 - 99 mg/dL    Comment: Glucose reference range applies only to samples taken after fasting for at least 8 hours.  CBG monitoring, ED     Status: None   Collection Time: 11/06/22  9:37 AM  Result Value Ref Range   Glucose-Capillary 77 70 - 99 mg/dL    Comment: Glucose reference range applies only to samples taken after fasting for at least 8 hours.  CBG monitoring, ED     Status: None   Collection Time: 11/06/22 12:07 PM  Result Value Ref Range   Glucose-Capillary 85 70 - 99 mg/dL    Comment: Glucose reference range applies only to samples taken after fasting for at least 8 hours.  CBG monitoring, ED     Status: None   Collection Time: 11/06/22  1:09 PM  Result Value Ref Range   Glucose-Capillary 81 70 - 99 mg/dL    Comment: Glucose reference range applies only to samples taken after fasting for at least 8 hours.   *Note: Due to a large number of results and/or encounters for the requested time period, some results have not been displayed. A complete set of results can be found in Results Review.   CT CHEST ABDOMEN PELVIS WO CONTRAST  Result Date: 11/05/2022 CLINICAL DATA:  Sepsis, acute kidney injury EXAM: CT CHEST, ABDOMEN AND PELVIS WITHOUT CONTRAST TECHNIQUE: Multidetector CT imaging of the chest, abdomen and pelvis was performed following the standard protocol without IV contrast. RADIATION DOSE REDUCTION: This exam was performed according to the departmental dose-optimization program which includes automated exposure control, adjustment of the mA and/or kV according to patient size and/or use of iterative  reconstruction technique. COMPARISON:  CT chest dated 07/31/2022. CT abdomen/pelvis dated 05/06/2022. FINDINGS: CT CHEST FINDINGS Cardiovascular: The heart is normal in size. No pericardial effusion. No evidence of thoracic aortic aneurysm. Mild atherosclerotic calcifications of the arch Moderate) of the LAD and  right coronary artery. Mediastinum/Nodes: No suspicious mediastinal lymphadenopathy. Status post right thyroidectomy. Left thyroid is within normal limits. Lungs/Pleura: Evaluation of the lung parenchyma is constrained by respiratory motion. Within that constraint, there are no suspicious pulmonary nodules, noting is stable perifissural lymph node along the right minor fissure (series 6/image 62). Mild dependent atelectasis in the posterior upper lobes. Patchy opacities in the lingula and bilateral lower lobes, some of which reflects chronic scarring, although superimposed atelectasis is favored. Mild focal opacity in the superior segment left lower lobe (series 6/image 58) could reflect mild pneumonia in the appropriate clinical setting. No pleural effusion or pneumothorax. Musculoskeletal: Visualized osseous structures are within normal limits. CT ABDOMEN PELVIS FINDINGS Hepatobiliary: Unenhanced liver is unremarkable. Status post cholecystectomy. No intrahepatic or extrahepatic ductal dilatation. Pancreas: Within normal limits. Spleen: Within normal limits. Adrenals/Urinary Tract: Adrenal glands are within normal limits. Kidneys are within normal limits.  No hydronephrosis. Bladder is underdistended and poorly evaluated. Stomach/Bowel: Stomach is within normal limits. No evidence of bowel obstruction. Normal appendix (series 2/image 82). No colonic wall thickening or inflammatory changes. Mild to moderate left colonic stool burden, suggesting mild constipation. Vascular/Lymphatic: No evidence of abdominal aortic aneurysm. No suspicious abdominopelvic lymphadenopathy. Reproductive: Status post hysterectomy. No adnexal masses. Other: No abdominopelvic ascites. Musculoskeletal: Visualized osseous structures are within normal limits. IMPRESSION: Lingular and bilateral lower lobe scarring/atelectasis. Superimposed mild left lower lobe pneumonia is possible in the appropriate clinical setting. Mild to moderate left  colonic stool burden, suggesting mild constipation. Additional ancillary findings as above. Electronically Signed   By: Charline Bills M.D.   On: 11/05/2022 22:51   DG Chest Port 1 View  Result Date: 11/05/2022 CLINICAL DATA:  Shortness of breath and hypotension.  The EXAM: PORTABLE CHEST 1 VIEW COMPARISON:  AP Lat 09/27/2022 FINDINGS: Exam technically limited due to body habitus. Surgical clips consistent with right hemithyroidectomy are again shown. Stable cardiomegaly. Increased central vascular prominence and mild basilar interstitial edema. Findings most likely due to CHF or fluid overload. There is increased patchy hazy opacity in both lower lung fields, denser on the left, which could be due to alveolar edema, pneumonia, atelectasis or combination. Small pleural effusions are beginning to form. The mid and upper lungs are generally clear. Stable mediastinum with lipomatosis and aortic atherosclerosis. No new osseous finding. IMPRESSION: 1. Increased central vascular prominence and mild basilar interstitial edema. 2. Increased patchy hazy opacity in both lower lung fields, denser on the left, which could be alveolar edema, pneumonia, atelectasis or combination. 3. Small pleural effusions. 4. Clinical correlation and radiographic follow-up recommended. Electronically Signed   By: Almira Bar M.D.   On: 11/05/2022 22:10    Pending Labs Unresulted Labs (From admission, onward)     Start     Ordered   11/06/22 0500  CBC with Differential/Platelet  Tomorrow morning,   R        11/05/22 2336   11/05/22 2153  Urinalysis, w/ Reflex to Culture (Infection Suspected) -Urine, Clean Catch  Once,   URGENT       Question:  Specimen Source  Answer:  Urine, Clean Catch   11/05/22 2152            Vitals/Pain Today's Vitals   11/06/22 6440  11/06/22 0700 11/06/22 1036 11/06/22 1206  BP:  119/68  116/67  Pulse:  78  76  Resp:  10  15  Temp: 97.9 F (36.6 C)  98.2 F (36.8 C) 97.6 F (36.4 C)   TempSrc: Oral  Oral Oral  SpO2:  98%  100%  Weight:      PainSc:        Isolation Precautions No active isolations  Medications Medications  acetaminophen (TYLENOL) tablet 650 mg (has no administration in time range)    Or  acetaminophen (TYLENOL) suppository 650 mg (has no administration in time range)  melatonin tablet 3 mg (has no administration in time range)  ondansetron (ZOFRAN) injection 4 mg (has no administration in time range)  azithromycin (ZITHROMAX) 500 mg in sodium chloride 0.9 % 250 mL IVPB (has no administration in time range)  cefTRIAXone (ROCEPHIN) 1 g in sodium chloride 0.9 % 100 mL IVPB (has no administration in time range)  lactated ringers infusion (0 mLs Intravenous Stopped 11/06/22 1321)  benzonatate (TESSALON) capsule 200 mg (has no administration in time range)  aspirin EC tablet 81 mg (81 mg Oral Given 11/06/22 0946)  Buprenorphine HCl FILM 750 mcg (750 mcg Oral Not Given 11/06/22 1306)  pantoprazole (PROTONIX) EC tablet 40 mg (40 mg Oral Given 11/06/22 0946)  famotidine (PEPCID) tablet 40 mg (has no administration in time range)  ferrous sulfate tablet 325 mg (325 mg Oral Given 11/06/22 0946)  levothyroxine (SYNTHROID) tablet 200 mcg (200 mcg Oral Given 11/06/22 0617)  LORazepam (ATIVAN) tablet 1 mg (has no administration in time range)  rosuvastatin (CRESTOR) tablet 20 mg (20 mg Oral Given 11/06/22 0946)  ziprasidone (GEODON) capsule 60 mg (has no administration in time range)  insulin aspart (novoLOG) injection 0-9 Units ( Subcutaneous Not Given 11/06/22 1209)  insulin glargine-yfgn (SEMGLEE) injection 7 Units (7 Units Subcutaneous Given 11/06/22 1029)  sodium chloride 0.9 % bolus 500 mL (0 mLs Intravenous Stopped 11/05/22 2317)  cefTRIAXone (ROCEPHIN) 1 g in sodium chloride 0.9 % 100 mL IVPB (0 g Intravenous Stopped 11/05/22 2325)  azithromycin (ZITHROMAX) 500 mg in sodium chloride 0.9 % 250 mL IVPB (0 mg Intravenous Stopped 11/06/22 0100)  sodium chloride 0.9 %  bolus 1,000 mL (0 mLs Intravenous Stopped 11/06/22 0020)    Mobility non-ambulatory in ED   A&Ox4, On RA, VSS

## 2022-11-07 ENCOUNTER — Ambulatory Visit: Payer: 59 | Admitting: Family

## 2022-11-07 DIAGNOSIS — J189 Pneumonia, unspecified organism: Secondary | ICD-10-CM | POA: Diagnosis not present

## 2022-11-07 DIAGNOSIS — I10 Essential (primary) hypertension: Secondary | ICD-10-CM | POA: Diagnosis not present

## 2022-11-07 DIAGNOSIS — M5440 Lumbago with sciatica, unspecified side: Secondary | ICD-10-CM | POA: Diagnosis not present

## 2022-11-07 DIAGNOSIS — N179 Acute kidney failure, unspecified: Secondary | ICD-10-CM | POA: Diagnosis not present

## 2022-11-07 LAB — CBC
HCT: 27.6 % — ABNORMAL LOW (ref 36.0–46.0)
Hemoglobin: 8.5 g/dL — ABNORMAL LOW (ref 12.0–15.0)
MCH: 26.8 pg (ref 26.0–34.0)
MCHC: 30.8 g/dL (ref 30.0–36.0)
MCV: 87.1 fL (ref 80.0–100.0)
Platelets: 178 10*3/uL (ref 150–400)
RBC: 3.17 MIL/uL — ABNORMAL LOW (ref 3.87–5.11)
RDW: 17.4 % — ABNORMAL HIGH (ref 11.5–15.5)
WBC: 7.1 10*3/uL (ref 4.0–10.5)
nRBC: 0 % (ref 0.0–0.2)

## 2022-11-07 LAB — RENAL FUNCTION PANEL
Albumin: 2.8 g/dL — ABNORMAL LOW (ref 3.5–5.0)
Anion gap: 11 (ref 5–15)
BUN: 37 mg/dL — ABNORMAL HIGH (ref 8–23)
CO2: 24 mmol/L (ref 22–32)
Calcium: 8.7 mg/dL — ABNORMAL LOW (ref 8.9–10.3)
Chloride: 106 mmol/L (ref 98–111)
Creatinine, Ser: 1.7 mg/dL — ABNORMAL HIGH (ref 0.44–1.00)
GFR, Estimated: 33 mL/min — ABNORMAL LOW (ref >60)
Glucose, Bld: 89 mg/dL (ref 70–99)
Phosphorus: 3.3 mg/dL (ref 2.5–4.6)
Potassium: 4.3 mmol/L (ref 3.5–5.1)
Sodium: 141 mmol/L (ref 135–145)

## 2022-11-07 LAB — GLUCOSE, CAPILLARY
Glucose-Capillary: 69 mg/dL — ABNORMAL LOW (ref 70–99)
Glucose-Capillary: 101 mg/dL — ABNORMAL HIGH (ref 70–99)
Glucose-Capillary: 89 mg/dL (ref 70–99)
Glucose-Capillary: 122 mg/dL — ABNORMAL HIGH (ref 70–99)
Glucose-Capillary: 113 mg/dL — ABNORMAL HIGH (ref 70–99)

## 2022-11-07 LAB — PROCALCITONIN: Procalcitonin: 2.41 ng/mL

## 2022-11-07 LAB — MAGNESIUM: Magnesium: 2.2 mg/dL (ref 1.7–2.4)

## 2022-11-07 MED ORDER — OXYCODONE HCL 5 MG PO TABS
5.0000 mg | ORAL_TABLET | Freq: Three times a day (TID) | ORAL | Status: DC | PRN
  Administered 2022-11-07: 5 mg via ORAL
  Filled 2022-11-07: qty 1

## 2022-11-07 MED ORDER — AZITHROMYCIN 250 MG PO TABS
500.0000 mg | ORAL_TABLET | Freq: Every day | ORAL | Status: DC
  Administered 2022-11-07: 500 mg via ORAL
  Filled 2022-11-07: qty 2

## 2022-11-07 NOTE — Evaluation (Signed)
Occupational Therapy Evaluation Patient Details Name: [name] MRN: [id number] DOB: Jul 19, 1955 Today's Date: 11/07/2022   History of Present Illness [name] is a 67 yr old female admitted to the hospital with shortness of breath. She was found to have PNA and AKI. PMH: TKA, L rotator cuff tear, HTN, DM II, peripheral polyneuropathy, schizophrenia, chronic anemia, chronic back pain, CKD 3, asthma, breast CA, OSA   Clinical Impression   The pt is currently presenting with the below listed deficits, which compromise her ADL performance (see OT problem list). During the session today, she required total assist for lower body dressing, mod assist for simulated upper body dressing, and max assist x2 for bed mobility. Pt required increased time and effort for all bed mobility, given weakness, deconditioning, increased chronic back and bilateral shoulder pain, and body habitus. She initially presented with poor sitting balance, however improved to fair. She reported having many falls at home, which often occur when she is attempting to transfer into & out of her wheelchair. She has a home health aide for a couple hours daily, however she appears to need much more care and support in the home. She declines SNF rehab and prefers to return home at discharge. If she returns home at discharge, maximal in-home support services are recommended, including home therapy and increased home health aide hours. If she is unable to receive increased in-home care and services, she may benefit from consideration of long-term care options, though she does not yet appear interested in pursuing such.     If plan is discharge home, recommend the following: A lot of help with walking and/or transfers;A lot of help with bathing/dressing/bathroom;Assistance with cooking/housework;Assist for transportation;Help with stairs or ramp for entrance    Functional Status Assessment  Patient has had a recent decline in their  functional status and demonstrates the ability to make significant improvements in function in a reasonable and predictable amount of time.  Equipment Recommendations  Hospital bed    Recommendations for Other Services       Precautions / Restrictions Restrictions Weight Bearing Restrictions: No      Mobility Bed Mobility Overal bed mobility: Needs Assistance Bed Mobility: Supine to Sit, Sit to Supine, Rolling Rolling: Max assist, +2 for physical assistance   Supine to sit: Max assist, +2 for physical assistance, Used rails, HOB elevated Sit to supine: Max assist, +2 for physical assistance   General bed mobility comments: Pt required increased time and effort for all bed mobility, given weakness, deconditioning, increased back and bilateral shoulder pain, and body habitus. Significant instruction was also provided on transfer technique(s), including implementing the log roll technique to decreased back pain       Balance       Sitting balance - Comments: Initially poor with improvement to fair       ADL either performed or assessed with clinical judgement   ADL Overall ADL's : Needs assistance/impaired Eating/Feeding: Set up;Bed level   Grooming: Moderate assistance;Bed level Grooming Details (indicate cue type and reason): limited by chronic BUE shoulder ROM limitations Upper Body Bathing: Moderate assistance;Bed level Upper Body Bathing Details (indicate cue type and reason): simulated Lower Body Bathing: Maximal assistance;Bed level Lower Body Bathing Details (indicate cue type and reason): simulated Upper Body Dressing : Moderate assistance;Bed level   Lower Body Dressing: Total assistance;Bed level       Toileting- Clothing Manipulation and Hygiene: Total assistance;Bed level Toileting - Clothing Manipulation Details (indicate cue type and reason): based  on clinical judgement                          Pertinent Vitals/Pain Pain Assessment Pain  Assessment: 0-10 Pain Score: 9  Pain Location: chronic back and bilateral should pain. Pain Intervention(s): Limited activity within patient's tolerance, Monitored during session, Repositioned, Patient requesting pain meds-RN notified     Extremity/Trunk Assessment Upper Extremity Assessment Upper Extremity Assessment: LUE deficits/detail;Right hand dominant;RUE deficits/detail RUE Deficits / Details: Severe chronic shoulder AROM limitations. AROM for shoulder flexion <1/4 normal ROM. Elbow and hand AROM WFL. Functional grip strength LUE Deficits / Details: Severe chronic shoulder AROM limitations. AROM for shoulder flexion <1/4 normal ROM. Elbow and hand AROM WFL. Functional grip strength   Lower Extremity Assessment Lower Extremity Assessment: Generalized weakness;RLE deficits/detail RLE Deficits / Details: chronic knee pain and stiffness; required intermittent AAROM for knee       Communication Communication Communication: No apparent difficulties   Cognition Arousal: Alert Behavior During Therapy: WFL for tasks assessed/performed Overall Cognitive Status: Within Functional Limits for tasks assessed                     Home Living Family/patient expects to be discharged to:: Private residence Living Arrangements: Alone.  Available Help at Discharge: Personal care attendant Type of Home: House Home Access: Ramped entrance     Home Layout: One level     Bathroom Shower/Tub: Walk-in shower         Home Equipment: Wheelchair - power;BSC/3in1;Shower seat          Prior Functioning/Environment Prior Level of Function : Needs assist             Mobility Comments: She performs scoot vs squat-pivot transfers into and out of her electric wheelchair. She has not ambulated in ~2.5 years.  She has a nephew who occasionally stops by to help her transfer into and out of her wheelchair.  ADLs Comments: She has a home health aide 7 days per week for 2.5 hours daily who  assists her with cleaning, meal prep,  dressing, spongebathing, and toileting (uses bedside commode for toileting). Pt reports having many falls at home. She does not feel that she has enough in-home support/assistance.         OT Problem List: Decreased strength;Decreased range of motion;Decreased activity tolerance;Impaired balance (sitting and/or standing);Obesity;Impaired UE functional use;Pain      OT Treatment/Interventions: Self-care/ADL training;Therapeutic exercise;Energy conservation;DME and/or AE instruction;Therapeutic activities;Balance training;Patient/family education    OT Goals(Current goals can be found in the care plan section) Acute Rehab OT Goals Patient Stated Goal: to be as functionally independent as possible OT Goal Formulation: With patient Time For Goal Achievement: 11/21/22 Potential to Achieve Goals: Fair ADL Goals Pt Will Perform Grooming: sitting;with min assist Pt Will Perform Upper Body Dressing: with min assist;sitting Pt Will Transfer to Toilet: with min assist;squat pivot transfer;bedside commode Additional ADL Goal #1: Pt will perform bed mobility with min assist, in prep for progressive ADL participation.  OT Frequency: Min 1X/week    Co-evaluation PT/OT/SLP Co-Evaluation/Treatment: Yes Reason for Co-Treatment: To address functional/ADL transfers;For patient/therapist safety PT goals addressed during session: Mobility/safety with mobility OT goals addressed during session: ADL's and self-care      AM-PAC OT "6 Clicks" Daily Activity     Outcome Measure Help from another person eating meals?: A Little Help from another person taking care of personal grooming?: A Little Help from another person toileting, which  includes using toliet, bedpan, or urinal?: Total Help from another person bathing (including washing, rinsing, drying)?: A Lot Help from another person to put on and taking off regular upper body clothing?: A Lot Help from another person  to put on and taking off regular lower body clothing?: Total 6 Click Score: 12   End of Session Equipment Utilized During Treatment: Other (comment) (N/A) Nurse Communication: Mobility status  Activity Tolerance: Patient limited by pain Patient left: in bed;with call bell/phone within reach;with bed alarm set  OT Visit Diagnosis: Unsteadiness on feet (R26.81);Muscle weakness (generalized) (M62.81);Pain;History of falling (Z91.81) Pain - part of body:  (back and bilateral shoulders)                Time: 5621-3086 OT Time Calculation (min): 46 min Charges:  OT General Charges $OT Visit: 1 Visit OT Evaluation $OT Eval Moderate Complexity: 1 Mod    Nithin Demeo L Romeo Zielinski, OTR/L 11/07/2022, 2:21 PM

## 2022-11-07 NOTE — Social Work (Addendum)
CSW received a call from Sharyn Creamer who reports that he is the Administration/ Nurse Paramedic for [facility] DSS. " Lorin Picket reports that patient currently has an open APS report and has called EMS 31 times to get assistance to get in and out the bed. Lorin Picket has reported that the patient is not reporting the true living condition of her home environment. Scott  also report's that the patient is working with the art team under [employer] # [phone]. Lorin Picket reports that the patient has denied all services from [facility]". Per chart review the patient is alert and orientated own decision maker. Per chart review the patient has also declined TOC resources. TOC will continue to follow for DC.

## 2022-11-07 NOTE — Evaluation (Signed)
Physical Therapy Evaluation Patient Details Name: [name] MRN: [id number] DOB: [birth year] Today's Date: 11/07/2022  History of Present Illness  Pt is a 67 yr old female admitted to the hospital with shortness of breath. She was found to have PNA and AKI. PMH: TKA, L rotator cuff tear, HTN, DM II, peripheral polyneuropathy, schizophrenia, chronic anemia, chronic back pain, CKD 3, asthma, breast CA, OSA   Clinical Impression  Pt is a 67 y.o. female with above HPI. Pt lives alone and [facility] aide comes 7x/week for 2.5hrs and nephew drops by intermittently. Pt performs squat pivot to electric wheelchair at baseline, at times able to perform transfers independently other times requires assist.  She reported having many falls at home, which often occur when she is attempting to transfer into & out of her wheelchair- she attributes falls to issues with R knee. On eval , pt required max assist x2 for bed mobility. Pt required increased time and effort for all bed mobility, given weakness, deconditioning, increased chronic back and bilateral shoulder pain, and body habitus.  Based on current mobility status, pt appears to need much more care and support than she currently has in the home. She declines SNF rehab and prefers to return home at discharge. If she returns home at discharge, maximal in-home support services are recommended, including home therapy and increased home health aide hours. If she is unable to receive increased in-home care and services, she may benefit from consideration of long-term care options, though she does not yet appear interested in pursuing these options currently.       If plan is discharge home, recommend the following: A lot of help with walking and/or transfers;A lot of help with bathing/dressing/bathroom;Assistance with cooking/housework;Assist for transportation;Help with stairs or ramp for entrance   Can travel by private vehicle        Equipment Recommendations  Hospital bed  Recommendations for Other Services       Functional Status Assessment Patient has had a recent decline in their functional status and/or demonstrates limited ability to make significant improvements in function in a reasonable and predictable amount of time     Precautions / Restrictions Precautions Precautions: Fall Precaution Comments: chronicn back and R knee pain Restrictions Weight Bearing Restrictions: No      Mobility  Bed Mobility Overal bed mobility: Needs Assistance Bed Mobility: Supine to Sit, Sit to Supine, Rolling Rolling: Max assist, +2 for physical assistance   Supine to sit: Max assist, +2 for physical assistance, Used rails, HOB elevated Sit to supine: Max assist, +2 for physical assistance   General bed mobility comments: Pt required increased time and effort for all bed mobility, given weakness, deconditioning, increased back and bilateral shoulder pain, and body habitus. Significant instruction was also provided on transfer technique(s), including implementing the log roll technique to decreased back pain. Pt able to sit EOB several minutes. initially with L elbow leaning, assist at pelvis and use of bed tilt features to achieve midline and pt progressing from MIN  A for seated balance to supervision.    Transfers                        Ambulation/Gait                  Stairs            Wheelchair Mobility     Tilt Bed    Modified Rankin (Stroke Patients Only)  Balance Overall balance assessment: Needs assistance Sitting-balance support: Bilateral upper extremity supported, Feet unsupported Sitting balance-Leahy Scale: Fair Sitting balance - Comments: Initially poor with improvement to fair                                     Pertinent Vitals/Pain Pain Assessment Pain Assessment: 0-10 Pain Score: 9  Pain Location: chronic back and bilateral should pain. Pain Intervention(s): Limited  activity within patient's tolerance, Monitored during session, Repositioned, Patient requesting pain meds-RN notified    Home Living Family/patient expects to be discharged to:: Private residence Living Arrangements: Alone Available Help at Discharge: Personal care attendant Type of Home: House Home Access: Ramped entrance       Home Layout: One level Home Equipment: Wheelchair - power;BSC/3in1;Shower seat Additional Comments: has multiple falls/week reports due to issues with R knee.    Prior Function Prior Level of Function : Needs assist             Mobility Comments: She performs scoot vs squat-pivot transfers into and out of her electric wheelchair. She has not ambulated in ~2.5 years. ADLs Comments: She has a home health aide 7 days per week for 2.5 hours daily who assists her with cleaning, meal prep,  dressing, spongebathing, and toileting (uses bedside commode for toileting). Pt reports having many falls at home. nephew comes to check in at times and assits her with transfer from w/c back to bed, but has 3 jobs so does not have time to stay long or come by more frequently.     Extremity/Trunk Assessment   Upper Extremity Assessment Upper Extremity Assessment: LUE deficits/detail;RUE deficits/detail;Right hand dominant RUE Deficits / Details: Severe chronic shoulder AROM limitations. AROM for shoulder flexion <1/4 normal ROM. Elbow and hand AROM WFL. Functional grip strength LUE Deficits / Details: Severe chronic shoulder AROM limitations. AROM for shoulder flexion <1/4 normal ROM. Elbow and hand AROM WFL. Functional grip strength    Lower Extremity Assessment Lower Extremity Assessment: Generalized weakness;RLE deficits/detail RLE Deficits / Details: chronic knee pain and stiffness; required intermittent AAROM for knee RLE: Unable to fully assess due to pain    Cervical / Trunk Assessment Cervical / Trunk Assessment: Normal  Communication    Communication Communication: No apparent difficulties  Cognition Arousal: Alert Behavior During Therapy: WFL for tasks assessed/performed Overall Cognitive Status: Within Functional Limits for tasks assessed                                          General Comments      Exercises     Assessment/Plan    PT Assessment Patient needs continued PT services  PT Problem List Decreased range of motion;Decreased strength;Decreased activity tolerance;Decreased balance;Decreased mobility;Decreased safety awareness;Pain;Obesity       PT Treatment Interventions DME instruction;Functional mobility training;Therapeutic activities;Therapeutic exercise;Balance training;Neuromuscular re-education;Wheelchair mobility training    PT Goals (Current goals can be found in the Care Plan section)  Acute Rehab PT Goals Patient Stated Goal: Decrease falls and have less pain PT Goal Formulation: With patient Time For Goal Achievement: 11/21/22 Potential to Achieve Goals: Fair    Frequency Min 1X/week     Co-evaluation   Reason for Co-Treatment: To address functional/ADL transfers;For patient/therapist safety PT goals addressed during session: Mobility/safety with mobility OT goals addressed during session: ADL's  and self-care       AM-PAC PT "6 Clicks" Mobility  Outcome Measure Help needed turning from your back to your side while in a flat bed without using bedrails?: Total Help needed moving from lying on your back to sitting on the side of a flat bed without using bedrails?: Total Help needed moving to and from a bed to a chair (including a wheelchair)?: Total Help needed standing up from a chair using your arms (e.g., wheelchair or bedside chair)?: Total Help needed to walk in hospital room?: Total Help needed climbing 3-5 steps with a railing? : Total 6 Click Score: 6    End of Session   Activity Tolerance: Patient limited by pain Patient left: in bed;with call  bell/phone within reach Nurse Communication: Mobility status (need for rotation schedule for pressure relief) PT Visit Diagnosis: Muscle weakness (generalized) (M62.81);History of falling (Z91.81);Pain;Other abnormalities of gait and mobility (R26.89) Pain - Right/Left:  (lower back pain and B shoulders) Pain - part of body: Shoulder (and back)    Time: 0102-7253 PT Time Calculation (min) (ACUTE ONLY): 43 min   Charges:   PT Evaluation $PT Eval Moderate Complexity: 1 Mod PT Treatments $Therapeutic Activity: 8-22 mins PT General Charges $$ ACUTE PT VISIT: 1 Visit       Lyman Speller PT, DPT  Acute Rehabilitation Services  Office [phone]  11/07/2022, 2:43 PM

## 2022-11-07 NOTE — TOC Initial Note (Signed)
Transition of Care [facility]) - Initial/Assessment Note    Patient Details  Name: [name] MRN: [id number] Date of Birth: December 24, 1955  Transition [facility] CM/SW Contact:    Otelia Santee, LCSW Phone Number: 11/07/2022, 3:45 PM  Clinical Narrative:                 Met with pt at bedside who shares she currently lives at home alone. Pt is active was [facility] for HHPT/OT/RN. CSW discussed recommendation for a hospital bed. Pt declines this and states she has had a hospital bed in the past that ended up hurting her spine worse. CSW discussed option for air pressure mattress to assist with this. Pt continues to decline and states she has her bed set up how she likes it at home. CSW encouraged pt to reach out to CSW should she change her mind.   Expected Discharge Plan: Home w Home Health Services Barriers to Discharge: Continued Medical Work up   Patient Goals and CMS Choice Patient states their goals for this hospitalization and ongoing recovery are:: To return home CMS Medicare.gov Compare Post Acute Care list provided to:: Patient Choice offered to / list presented to : Patient [employer] ownership interest in [facility].provided to:: Patient    Expected Discharge Plan and Services In-house Referral: Clinical Social Work Discharge Planning Services: NA Post Acute Care Choice: Home Health Living arrangements for the past 2 months: Single Family Home                                      Prior Living Arrangements/Services Living arrangements for the past 2 months: Single Family Home Lives with:: Self Patient language and need for interpreter reviewed:: Yes Do you feel safe going back to the place where you live?: Yes      Need for Family Participation in Patient Care: No (Comment) Care giver support system in place?: Yes (comment) Current home services: Home OT, Home PT, Home RN, DME Criminal Activity/Legal Involvement Pertinent to Current  Situation/Hospitalization: No - Comment as needed  Activities of Daily Living Home Assistive Devices/Equipment: Wheelchair, CBG Meter, Bedside commode/3-in-1, Grab bars in shower, Grab bars around toilet ADL Screening (condition at time of admission) Patient's cognitive ability adequate to safely complete daily activities?: Yes Is the patient deaf or have difficulty hearing?: No Does the patient have difficulty seeing, even when wearing glasses/contacts?: No Does the patient have difficulty concentrating, remembering, or making decisions?: Yes Patient able to express need for assistance with ADLs?: Yes Does the patient have difficulty dressing or bathing?: Yes Independently performs ADLs?: No Dressing (OT): Needs assistance Is this a change from baseline?: Pre-admission baseline Toileting: Dependent Is this a change from baseline?: Pre-admission baseline Does the patient have difficulty walking or climbing stairs?: Yes Weakness of Legs: Both Weakness of Arms/Hands: Both  Permission Sought/Granted Permission sought to share information with : Facility Industrial/product designer granted to share information with : Yes, Verbal Permission Granted              Emotional Assessment Appearance:: Appears stated age Attitude/Demeanor/Rapport: Engaged Affect (typically observed): Accepting Orientation: : Oriented to Self, Oriented to Place, Oriented to  Time, Oriented to Situation Alcohol / Substance Use: Not Applicable Psych Involvement: No (comment)  Admission diagnosis:  Community acquired pneumonia [J18.9] AKI (acute kidney injury) (HCC) [N17.9] HCAP (healthcare-associated pneumonia) [J18.9] Patient Active Problem List  Diagnosis Date Noted   Community acquired pneumonia 11/06/2022   Pressure injury of skin 11/06/2022   Drug overdose, accidental or unintentional, initial encounter 09/28/2022   Dehydration 09/28/2022   Anxiety and depression 09/28/2022   Overdose,  accidental or unintentional, initial encounter 09/28/2022   Acute renal failure superimposed on stage 3b chronic kidney disease (HCC) 07/31/2022   Acute kidney injury superimposed on chronic kidney disease (HCC) 07/31/2022   Acute on chronic respiratory failure (HCC) 07/31/2022   Chronic diastolic CHF (congestive heart failure) (HCC) 07/22/2022   Hypoxia 07/22/2022   Obesity hypoventilation syndrome (HCC) 07/22/2022   Hypokalemia 07/22/2022   Deficiency of parathyrin (HCC) 01/27/2022   CHF exacerbation (HCC) 09/02/2021   Generalized weakness 08/31/2021   Ambulatory dysfunction 08/31/2021   Chronic respiratory failure with hypoxia (HCC) 08/31/2021   AMS (altered mental status) 09/29/2020   Hyperparathyroidism, secondary (HCC) 03/23/2018   Memory difficulty 12/22/2017   Spinal stenosis 06/09/2017   HNP (herniated nucleus pulposus), lumbar 12/05/2016   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   Diabetic neuropathy (HCC) 09/08/2016   Hypocalcemia 08/22/2016   Abnormal barium swallow 07/07/2016   Anxiety 07/07/2016   History of malignant neoplasm of breast 07/07/2016   Gait abnormality 05/29/2016   Encephalopathy acute 04/23/2016   Hyponatremia 04/23/2016   Schizophrenia, paranoid (HCC)    Aspiration pneumonia of left lower lobe due to gastric secretions (HCC)    Projectile vomiting with nausea    Altered mental status    Schizophrenia (HCC)    Disorganized schizophrenia (HCC)    Chronic pain syndrome    Acute renal failure (HCC)    Acute encephalopathy    Oropharyngeal dysphagia    Aspiration pneumonia of right lower lobe due to vomit (HCC)    Acute renal failure (ARF) (HCC) 03/28/2016   Encephalopathy 03/27/2016   HCAP (healthcare-associated pneumonia) 02/29/2016   Pneumonia 02/14/2016   CAP (community acquired pneumonia) 02/13/2016   Edema 02/13/2016   AKI (acute kidney injury) (HCC) 02/13/2016   Elevated troponin 06/24/2015    Diarrhea 06/23/2015   Sepsis (HCC) 06/23/2015   Obesity, morbid, BMI 50 or higher (HCC) 03/26/2015   Post traumatic stress disorder (PTSD) 11/14/2014   Leukocytosis 11/12/2014   Slurred speech 11/12/2014   Stage 3b chronic kidney disease (CKD) (HCC)    Diabetes mellitus, type II, insulin dependent (HCC)    TIA (transient ischemic attack)    Hypotension 10/28/2014   Syncope 10/28/2014   Anemia of chronic disease 10/28/2014   DM2 (diabetes mellitus, type 2) (HCC) 10/28/2014   OSA on CPAP 10/28/2014   Chronic lower back pain 10/28/2014   Polyneuropathy in diabetes(357.2) 10/28/2014   Diabetic polyneuropathy (HCC)    Fall 08/14/2014   Recurrent falls 08/14/2014   Hot flashes 02/24/2014   Arthralgia 02/24/2014   Hair loss 02/24/2014   Chest pain 01/06/2014   Chronic back pain 01/06/2014   Shock (HCC) 08/08/2013   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   Dysarthria 12/30/2012   Benign paroxysmal positional vertigo 12/30/2012   Breast cancer of upper-outer quadrant of right female breast (HCC) 11/18/2012   Hemorrhage of rectum and anus 09/28/2012   Hx of radiation therapy    Syncope and collapse 06/04/2011   HSV 10/05/2008   Mild persistent asthma 06/02/2008   Dyspnea 07/19/2007   Hyperlipidemia 06/02/2006   Sinus tachycardia 05/25/2006   Acquired hypothyroidism 02/27/2006   Depression 02/27/2006   Essential hypertension 02/27/2006   GERD 02/27/2006   KNEE PAIN  02/27/2006   PCP:  Jamey Reas, PA-C Pharmacy:   [facility] - [place], [place] - [address] [place] [address] Phone: [phone] Fax: [phone]  [facility] [address] [place] [address] Phone: [phone] Fax: [phone]     Social Determinants of Health (SDOH) Social History: SDOH Screenings   Food Insecurity: No Food Insecurity (11/06/2022)  Housing: Low Risk  (11/06/2022)  Transportation Needs: No  Transportation Needs (11/06/2022)  Utilities: Not At Risk (11/06/2022)  Tobacco Use: Medium Risk (11/06/2022)   SDOH Interventions:     Readmission Risk Interventions    07/25/2022    2:28 PM  Readmission Risk Prevention Plan  Transportation Screening Complete  Medication Review (RN Care Manager) Referral to Pharmacy  PCP or Specialist appointment within 3-5 days of discharge Complete  HRI or Home Care Consult Complete  SW Recovery Care/Counseling Consult Complete  Palliative Care Screening Not Applicable  Skilled Nursing Facility Patient Refused

## 2022-11-07 NOTE — Progress Notes (Signed)
PROGRESS NOTE  [name] ZOX:[id number] DOB: 03/03/1955   PCP: Jamey Reas, PA-C  Patient is from: Home.  Lives alone.  Uses wheelchair at baseline.  DOA: 11/05/2022 LOS: 2  Chief complaints Chief Complaint  Patient presents with   Shortness of Breath   Hypotension     Brief Narrative / Interim history: 67 year old F with PMH of morbid obesity, DM-2, CKD-3B, schizophrenia, chronic back pain, anemia, HTN, hypothyroidism, HLD and ambulatory dysfunction with wheelchair dependence presenting with shortness of breath, cough and subjective fever for about 2 days, and admitted for community-acquired pneumonia and AKI. Cr 12.9 (baseline about 1.6).  CT chest/abdomen/pelvis without contrast showed lingular and bilateral lower lobe scarring/atelectasis with superimposed mild LLL pneumonia, mild to moderate left colonic stool burden.  Patient was started on IV fluid for AKI and antibiotics for pneumonia, and admitted.  Patient improved from respiratory and mental status standpoint.  Likely discharged with home health and DME on 9/21.     Subjective: Seen and examined earlier this morning.  No major events overnight of this morning.  No complaints other than chronic back pain.  She is awake and more alert.  She was evaluated by therapy.  She is not interested in going to SNF.  She prefers to go home.   Objective: Vitals:   11/06/22 2359 11/07/22 0345 11/07/22 0500 11/07/22 1203  BP: 118/60 (!) 122/59  (!) 133/117  Pulse: 93 97  89  Resp: 18 18  17   Temp: 98.8 F (37.1 C) 98.7 F (37.1 C)  98.9 F (37.2 C)  TempSrc: Oral Oral  Oral  SpO2: 97% 97%  99%  Weight:   (!) 136.2 kg     Examination:  GENERAL: No apparent distress.  Nontoxic. HEENT: MMM.  Vision and hearing grossly intact.  NECK: Supple.  No apparent JVD.  RESP:  No IWOB.  Fair aeration bilaterally but did exam due to body habitus. CVS:  RRR. Heart sounds normal.  ABD/GI/GU: BS+. Abd soft, NTND.  MSK/EXT:   Moves extremities some.  No apparent deformity.  Difficult to assess edema due to body habitus SKIN: no apparent skin lesion or wound NEURO: Awake and alert.  Oriented x 4.  No apparent focal neuro deficit. PSYCH: Calm. Normal affect.   Procedures:  None  Microbiology summarized: COVID-19 PCR nonreactive Blood cultures NGTD  Assessment and plan: Principal Problem:   CAP (community acquired pneumonia) Active Problems:   Acquired hypothyroidism   Hyperlipidemia   Essential hypertension   Chronic back pain   Anemia of chronic disease   DM2 (diabetes mellitus, type 2) (HCC)   Schizophrenia (HCC)   Acute renal failure superimposed on stage 3b chronic kidney disease (HCC)   Community acquired pneumonia   Pressure injury of skin  Community-acquired pneumonia: Presents with progressive SOB, dry cough and subjective fever.  CT chest/abdomen/pelvis without contrast raises concern for lingular and bibasilar infiltrate, and mild to moderate constipation.  COVID-19 PCR nonreactive.  Blood cultures NGTD.  Procalcitonin elevated. -Continue ceftriaxone and Zithromax for community-acquired pneumonia -Mucolytic's, antitussives and nebs as needed  AKI on CKD-3B: Baseline Cr 1.6 but previously as high as 4.8.  She is on Benicar/HCT and Demadex.  Improving. Recent Labs    08/01/22 0234 08/02/22 0130 09/27/22 2302 09/28/22 0405 09/29/22 0236 09/30/22 0604 11/05/22 2138 11/05/22 2149 11/06/22 0450 11/07/22 0524  BUN 41* 35* 126* 124* 93* 64* 44* 41* 41* 37*  CREATININE 1.83* 1.65* 4.84* 4.34* 2.73* 1.66* 2.13* 2.90* 2.21* 1.70*  -  Continue holding ACE inhibitor's and diuretics -Recheck renal function in the morning  DM-2 with hyperlipidemia and CKD-3B: A1c 6.2% in 09/2022.  CBG within normal range. Recent Labs  Lab 11/06/22 1636 11/06/22 2114 11/07/22 0718 11/07/22 0745 11/07/22 1202  GLUCAP 77 136* 69* 101* 89  -Continue SSI -Discontinue Semglee  Essential hypertension:  Normotensive off home antihypertensive meds for most part. -Continue holding home meds  Chronic back pain: On significant dose of Belbuca, Lyrica, oxycodone.  Last filled on 9/5. She is awake but not quite alert.  She is at risk for polypharmacy and CO2 retention. -Continue home Belbuca and Lyrica.  Patient has to bring Belbuca from home. -Continue resume home oxycodone.  Continue holding Flexeril  Hypothyroidism -Continue home Synthroid  Anemia of chronic disease: Baseline Hgb about 9.  Stable -Continue monitoring  Overactive bladder -Continue home meds.  Physical deconditioning/ambulatory dysfunction: Wheelchair dependent at baseline. -Asked to return home with home health and DME.  Ordered per recommendation by therapy  Mood disorder/history of schizophrenia: Stable. -Continue home Geodon and Ativan.  At risk for polypharmacy: On multiple sedating medication.  Discusses this with patient.  Unfortunately, she has significant chronic back pain.  She says she was on oxycodone 15 mg before.  She was taken off oxycodone but hard to restart at 5 mg due to poorly controlled back pain.  Very difficult situation.  Morbid obesity control rating to back pain, affecting mobility and comorbidities. Body mass index is 49.97 kg/m.  Pressure skin injury: POA Pressure Injury 11/06/22 Sacrum Stage 1 -  Intact skin with non-blanchable redness of a localized area usually over a bony prominence. (Active)  11/06/22 0100  Location: Sacrum  Location Orientation:   Staging: Stage 1 -  Intact skin with non-blanchable redness of a localized area usually over a bony prominence.  Wound Description (Comments):   Present on Admission: Yes   DVT prophylaxis:  SCDs Start: 11/05/22 2336  Code Status: Full code Family Communication: None at bedside Level of care: Med-Surg Status is: Inpatient Remains inpatient appropriate because: Community-acquired pneumonia and generalized weakness   Final  disposition: Home with home health and DME on 9/21. Consultants:  None  55 minutes with more than 50% spent in reviewing records, counseling patient/family and coordinating care.   Sch Meds:  Scheduled Meds:  aspirin EC  81 mg Oral Daily   azithromycin  500 mg Oral q1800   Buprenorphine HCl  750 mcg Oral BID   doxepin  50 mg Oral QHS   famotidine  40 mg Oral QPC supper   ferrous sulfate  325 mg Oral Daily   influenza vaccine adjuvanted  0.5 mL Intramuscular Tomorrow-1000   insulin aspart  0-9 Units Subcutaneous TID WC   levothyroxine  200 mcg Oral Q0600   linaclotide  290 mcg Oral QAC breakfast   pantoprazole  40 mg Oral Daily   pregabalin  100 mg Oral BID   rosuvastatin  20 mg Oral Daily   venlafaxine XR  150 mg Oral BID   ziprasidone  60 mg Oral QPC supper   Continuous Infusions:  cefTRIAXone (ROCEPHIN)  IV 1 g (11/06/22 2108)   PRN Meds:.acetaminophen **OR** acetaminophen, benzonatate, LORazepam, melatonin, ondansetron (ZOFRAN) IV, oxyCODONE  Antimicrobials: Anti-infectives (From admission, onward)    Start     Dose/Rate Route Frequency Ordered Stop   11/07/22 1800  azithromycin (ZITHROMAX) tablet 500 mg        500 mg Oral Daily-1800 11/07/22 1349     11/06/22 1800  azithromycin (ZITHROMAX) 500 mg in sodium chloride 0.9 % 250 mL IVPB  Status:  Discontinued        500 mg 250 mL/hr over 60 Minutes Intravenous Every 24 hours 11/05/22 2337 11/07/22 1349   11/06/22 1800  cefTRIAXone (ROCEPHIN) 1 g in sodium chloride 0.9 % 100 mL IVPB        1 g 200 mL/hr over 30 Minutes Intravenous Every 24 hours 11/05/22 2337     11/05/22 2215  cefTRIAXone (ROCEPHIN) 1 g in sodium chloride 0.9 % 100 mL IVPB        1 g 200 mL/hr over 30 Minutes Intravenous  Once 11/05/22 2213 11/05/22 2325   11/05/22 2215  azithromycin (ZITHROMAX) 500 mg in sodium chloride 0.9 % 250 mL IVPB        500 mg 250 mL/hr over 60 Minutes Intravenous  Once 11/05/22 2213 11/06/22 0100        I have personally  reviewed the following labs and images: CBC: Recent Labs  Lab 11/05/22 2138 11/05/22 2149 11/06/22 0450 11/07/22 0524  WBC 10.1  --  11.6* 7.1  NEUTROABS 6.7  --  9.4*  --   HGB 9.2* 9.9* 9.0* 8.5*  HCT 30.2* 29.0* 29.5* 27.6*  MCV 87.3  --  86.5 87.1  PLT 227  --  191 178   BMP &GFR Recent Labs  Lab 11/05/22 2138 11/05/22 2149 11/05/22 2335 11/06/22 0450 11/07/22 0524  NA 134* 136  --  135 141  K 4.0 4.3  --  4.5 4.3  CL 97* 99  --  100 106  CO2 25  --   --  24 24  GLUCOSE 105* 100*  --  101* 89  BUN 44* 41*  --  41* 37*  CREATININE 2.13* 2.90*  --  2.21* 1.70*  CALCIUM 9.1  --   --  8.8* 8.7*  MG  --   --  2.6* 2.4 2.2  PHOS  --   --   --  4.6 3.3   Estimated Creatinine Clearance: 45.6 mL/min (A) (by C-G formula based on SCr of 1.7 mg/dL (H)). Liver & Pancreas: Recent Labs  Lab 11/05/22 2138 11/06/22 0450 11/07/22 0524  AST 15 13*  --   ALT 16 15  --   ALKPHOS 83 79  --   BILITOT 0.5 0.5  --   PROT 7.0 6.1*  --   ALBUMIN 3.8 3.4* 2.8*   No results for input(s): "LIPASE", "AMYLASE" in the last 168 hours. No results for input(s): "AMMONIA" in the last 168 hours. Diabetic: No results for input(s): "HGBA1C" in the last 72 hours. Recent Labs  Lab 11/06/22 1636 11/06/22 2114 11/07/22 0718 11/07/22 0745 11/07/22 1202  GLUCAP 77 136* 69* 101* 89   Cardiac Enzymes: Recent Labs  Lab 11/05/22 2335  CKTOTAL 109   No results for input(s): "PROBNP" in the last 8760 hours. Coagulation Profile: No results for input(s): "INR", "PROTIME" in the last 168 hours. Thyroid Function Tests: No results for input(s): "TSH", "T4TOTAL", "FREET4", "T3FREE", "THYROIDAB" in the last 72 hours. Lipid Profile: No results for input(s): "CHOL", "HDL", "LDLCALC", "TRIG", "CHOLHDL", "LDLDIRECT" in the last 72 hours. Anemia Panel: No results for input(s): "VITAMINB12", "FOLATE", "FERRITIN", "TIBC", "IRON", "RETICCTPCT" in the last 72 hours. Urine analysis:    Component Value  Date/Time   COLORURINE YELLOW 09/28/2022 0935   APPEARANCEUR CLEAR 09/28/2022 0935   APPEARANCEUR Clear 11/13/2013 1310   LABSPEC 1.011 09/28/2022 0935   LABSPEC 1.017 11/13/2013  1310   PHURINE 5.0 09/28/2022 0935   GLUCOSEU NEGATIVE 09/28/2022 0935   GLUCOSEU Negative 11/13/2013 1310   GLUCOSEU NEG mg/dL 16/11/9602 5409   HGBUR NEGATIVE 09/28/2022 0935   BILIRUBINUR NEGATIVE 09/28/2022 0935   BILIRUBINUR Negative 11/13/2013 1310   KETONESUR NEGATIVE 09/28/2022 0935   PROTEINUR NEGATIVE 09/28/2022 0935   UROBILINOGEN 0.2 11/12/2014 2057   NITRITE NEGATIVE 09/28/2022 0935   LEUKOCYTESUR NEGATIVE 09/28/2022 0935   LEUKOCYTESUR Trace 11/13/2013 1310   Sepsis Labs: Invalid input(s): "PROCALCITONIN", "LACTICIDVEN"  Microbiology: Recent Results (from the past 240 hour(s))  Blood culture (routine x 2)     Status: None (Preliminary result)   Collection Time: 11/05/22  9:38 PM   Specimen: BLOOD  Result Value Ref Range Status   Specimen Description   Final    BLOOD LEFT ANTECUBITAL Performed at [facility], [address] [place], [place] [address]    Special Requests   Final    BOTTLES DRAWN AEROBIC AND ANAEROBIC Blood Culture adequate volume Performed at [facility], [address] [place], [place] [address]    Culture   Final    NO GROWTH 2 DAYS Performed at [facility], [address], [place], [place] [address]    Report Status PENDING  Incomplete  SARS Coronavirus 2 by RT PCR (hospital order, performed in [facility] hospital lab) *cepheid single result test* Anterior Nasal Swab     Status: None   Collection Time: 11/05/22  9:48 PM   Specimen: Anterior Nasal Swab  Result Value Ref Range Status   SARS Coronavirus 2 by RT PCR NEGATIVE NEGATIVE Final    Comment: (NOTE) SARS-CoV-2 target nucleic acids are NOT DETECTED.  The SARS-CoV-2 RNA is generally detectable in upper and lower respiratory specimens during the acute phase  of infection. The lowest concentration of SARS-CoV-2 viral copies this assay can detect is 250 copies / mL. A negative result does not preclude SARS-CoV-2 infection and should not be used as the sole basis for treatment or other patient management decisions.  A negative result may occur with improper specimen collection / handling, submission of specimen other than nasopharyngeal swab, presence of viral mutation(s) within the areas targeted by this assay, and inadequate number of viral copies (<250 copies / mL). A negative result must be combined with clinical observations, patient history, and epidemiological information.  Fact Sheet for Patients:   RoadLapTop.co.za  Fact Sheet for Healthcare Providers: http://kim-miller.com/  This test is not yet approved or  cleared by the [place] FDA and has been authorized for detection and/or diagnosis of SARS-CoV-2 by FDA under an Emergency Use Authorization (EUA).  This EUA will remain in effect (meaning this test can be used) for the duration of the COVID-19 declaration under Section 564(b)(1) of the Act, 21 U.S.C. section 360bbb-3(b)(1), unless the authorization is terminated or revoked sooner.  Performed at [facility], [address], [place], [place] [address]   Blood culture (routine x 2)     Status: None (Preliminary result)   Collection Time: 11/05/22 11:35 PM   Specimen: BLOOD  Result Value Ref Range Status   Specimen Description   Final    BLOOD BLOOD LEFT FOREARM Performed at [facility], [address], [place], [place] [address]    Special Requests   Final    BOTTLES DRAWN AEROBIC AND ANAEROBIC Blood Culture results may not be optimal due to an inadequate volume of blood received in culture  bottles Performed at [facility], [address], [place], [place] [address]    Culture   Final    NO GROWTH 1 DAY Performed  at [name], [address], [place], [place] [address]    Report Status PENDING  Incomplete    Radiology Studies: No results found.    Kaylianna Detert T. Hady Niemczyk [facility]  If 7PM-7AM, please contact night-coverage [email or web address] 11/07/2022, 2:53 PM

## 2022-11-07 NOTE — Plan of Care (Signed)
  Problem: Education: Goal: Ability to describe self-care measures that may prevent or decrease complications (Diabetes Survival Skills Education) will improve Outcome: Not Progressing   Problem: Coping: Goal: Ability to adjust to condition or change in health will improve Outcome: Progressing   Problem: Clinical Measurements: Goal: Diagnostic test results will improve Outcome: Progressing Goal: Respiratory complications will improve Outcome: Progressing

## 2022-11-08 ENCOUNTER — Other Ambulatory Visit (HOSPITAL_COMMUNITY): Payer: Self-pay

## 2022-11-08 DIAGNOSIS — N179 Acute kidney failure, unspecified: Secondary | ICD-10-CM | POA: Diagnosis not present

## 2022-11-08 DIAGNOSIS — I10 Essential (primary) hypertension: Secondary | ICD-10-CM | POA: Diagnosis not present

## 2022-11-08 DIAGNOSIS — J189 Pneumonia, unspecified organism: Secondary | ICD-10-CM | POA: Diagnosis not present

## 2022-11-08 DIAGNOSIS — D638 Anemia in other chronic diseases classified elsewhere: Secondary | ICD-10-CM | POA: Diagnosis not present

## 2022-11-08 LAB — RENAL FUNCTION PANEL
Albumin: 3 g/dL — ABNORMAL LOW (ref 3.5–5.0)
Anion gap: 10 (ref 5–15)
BUN: 31 mg/dL — ABNORMAL HIGH (ref 8–23)
CO2: 26 mmol/L (ref 22–32)
Calcium: 9 mg/dL (ref 8.9–10.3)
Chloride: 106 mmol/L (ref 98–111)
Creatinine, Ser: 1.47 mg/dL — ABNORMAL HIGH (ref 0.44–1.00)
GFR, Estimated: 39 mL/min — ABNORMAL LOW (ref >60)
Glucose, Bld: 99 mg/dL (ref 70–99)
Phosphorus: 3.6 mg/dL (ref 2.5–4.6)
Potassium: 4.2 mmol/L (ref 3.5–5.1)
Sodium: 142 mmol/L (ref 135–145)

## 2022-11-08 LAB — PROCALCITONIN: Procalcitonin: 2.43 ng/mL

## 2022-11-08 LAB — GLUCOSE, CAPILLARY
Glucose-Capillary: 94 mg/dL (ref 70–99)
Glucose-Capillary: 127 mg/dL — ABNORMAL HIGH (ref 70–99)

## 2022-11-08 LAB — CBC
HCT: 29.5 % — ABNORMAL LOW (ref 36.0–46.0)
Hemoglobin: 9.2 g/dL — ABNORMAL LOW (ref 12.0–15.0)
MCH: 26.6 pg (ref 26.0–34.0)
MCHC: 31.2 g/dL (ref 30.0–36.0)
MCV: 85.3 fL (ref 80.0–100.0)
Platelets: 186 10*3/uL (ref 150–400)
RBC: 3.46 MIL/uL — ABNORMAL LOW (ref 3.87–5.11)
RDW: 17.9 % — ABNORMAL HIGH (ref 11.5–15.5)
WBC: 7.5 10*3/uL (ref 4.0–10.5)
nRBC: 0 % (ref 0.0–0.2)

## 2022-11-08 LAB — MAGNESIUM: Magnesium: 2.2 mg/dL (ref 1.7–2.4)

## 2022-11-08 MED ORDER — AMOXICILLIN-POT CLAVULANATE 875-125 MG PO TABS
1.0000 | ORAL_TABLET | Freq: Two times a day (BID) | ORAL | 0 refills | Status: AC
  Filled 2022-11-08: qty 4, 2d supply, fill #0

## 2022-11-08 MED ORDER — AMOXICILLIN-POT CLAVULANATE 875-125 MG PO TABS: 1.0000 | ORAL_TABLET | Freq: Two times a day (BID) | ORAL | 0 refills | Status: DC

## 2022-11-08 NOTE — TOC Progression Note (Signed)
Transition of Care [facility]) - Progression Note    Patient Details  Name: [name] MRN: [id number] Date of Birth: 08/10/1955  Transition of Care [facility]) CM/SW Contact  Adrian Prows, RN Phone Number: 11/08/2022, 10:10 AM  Clinical Narrative:    D/C orders received; spoke w/ pt in room; she says she uses [facility] for transport; pt says her aide thru [employer] does not come on weekends; she says her son lives in [place], and her dtr has cancer; LVM for Josph Macho at [employer] [phone]) to discuss pt's d/c; also LVM for pt's dtr Lorre Nick [phone]); attempted to contact pt's son Truitt Merle [phone]); unable to LVM; message says VM full.  -1016- call back by pt's dtr Christiane; she says she will be at pt's home to receive her; she verified d/c address: [address] [place], [phone]; pt's dtr would like call when pt has been picked up by [facility]; she can be contacted at [phone]; awaiting meds to bed.   Expected Discharge Plan: Home w Home Health Services Barriers to Discharge: Continued Medical Work up  Expected Discharge Plan and Services In-house Referral: Clinical Social Work Discharge Planning Services: NA Post Acute Care Choice: Home Health Living arrangements for the past 2 months: Single Family Home Expected Discharge Date: 11/08/22                                     Social Determinants of Health (SDOH) Interventions SDOH Screenings   Food Insecurity: No Food Insecurity (11/06/2022)  Housing: Low Risk  (11/06/2022)  Transportation Needs: No Transportation Needs (11/06/2022)  Utilities: Not At Risk (11/06/2022)  Tobacco Use: Medium Risk (11/06/2022)    Readmission Risk Interventions    07/25/2022    2:28 PM  Readmission Risk Prevention Plan  Transportation Screening Complete  Medication Review (RN Care Manager) Referral to Pharmacy  PCP or Specialist appointment within 3-5 days of discharge Complete  HRI or  Home Care Consult Complete  SW Recovery Care/Counseling Consult Complete  Palliative Care Screening Not Applicable  Skilled Nursing Facility Patient Refused

## 2022-11-08 NOTE — TOC Transition Note (Addendum)
Transition of Care [facility]) - CM/SW Discharge Note   Patient Details  Name: [name] MRN: [id number] Date of Birth: [birth year]  Transition of Care [facility]) CM/SW Contact:  Adrian Prows, RN Phone Number: 11/08/2022, 1:22 PM   Clinical Narrative:    Notified by Doreatha Martin, RN pt has received meds and is ready for transport; [facility] called at [phone]; spoke w/ operator [phone]; Sam, RN will call pt's dtr Lorre Nick upon pick up by Clarita Leber at [facility] notified pt will d/c today; no TOC needs.   Final next level of care: Home w Home Health Services Barriers to Discharge: No Barriers Identified   Patient Goals and CMS Choice CMS Medicare.gov Compare Post Acute Care list provided to:: Patient Choice offered to / list presented to : Patient  Discharge Placement                    Name of family member notified: Lorre Nick (dtr) [phone] Patient and family notified of of transfer: 11/08/22  Discharge Plan and Services Additional resources added to the After Visit Summary for   In-house Referral: Clinical Social Work Discharge Planning Services: NA Post Acute Care Choice: Home Health                               Social Determinants of Health (SDOH) Interventions SDOH Screenings   Food Insecurity: No Food Insecurity (11/06/2022)  Housing: Low Risk  (11/06/2022)  Transportation Needs: No Transportation Needs (11/06/2022)  Utilities: Not At Risk (11/06/2022)  Tobacco Use: Medium Risk (11/06/2022)     Readmission Risk Interventions    07/25/2022    2:28 PM  Readmission Risk Prevention Plan  Transportation Screening Complete  Medication Review (RN Care Manager) Referral to Pharmacy  PCP or Specialist appointment within 3-5 days of discharge Complete  HRI or Home Care Consult Complete  SW Recovery Care/Counseling Consult Complete  Palliative Care Screening Not Applicable  Skilled Nursing Facility Patient Refused

## 2022-11-08 NOTE — Discharge Summary (Signed)
Physician Discharge Summary  PROVIDENCE HAGGERTY YQM:[id number] DOB: [birth year] DOA: 11/05/2022  PCP: Jamey Reas, PA-C  Admit date: 11/05/2022 Discharge date: 11/08/2022 Admitted From: Home Disposition: Home Recommendations for Outpatient Follow-up:  Follow up with PCP in 1 week Patient is at risk for polypharmacy on multiple sedating medications.  Recommend reviewing Check CMP and CBC at follow-up Please follow up on the following pending results: None  Home Health: [facility] PT/OT/RN/aide Equipment/Devices: Hospital bed  Discharge Condition: Stable CODE STATUS: Full code  Follow-up Information     Jaquilla, Basilio, PA-C. Schedule an appointment as soon as possible for a visit in 1 week(s).   Specialty: Physician Assistant Contact information: [address] [place], [place] [address] [phone]                 Hospital course 67 year old F with PMH of morbid obesity, DM-2, CKD-3B, schizophrenia, chronic back pain, anemia, HTN, hypothyroidism, HLD and ambulatory dysfunction with wheelchair dependence presenting with shortness of breath, cough and subjective fever for about 2 days, and admitted for community-acquired pneumonia and AKI. Cr 12.9 (baseline about 1.6).  CT chest/abdomen/pelvis without contrast showed lingular and bilateral lower lobe scarring/atelectasis with superimposed mild LLL pneumonia, mild to moderate left colonic stool burden.  Patient was started on IV fluid for AKI and antibiotics for pneumonia, and admitted.   Patient was continued on ceftriaxone and Zithromax as well as IV fluid with improvement in his symptoms.  Therapy recommended home health and hospital as patient was adamant about going home on discharge.  On the day of discharge, respiratory symptoms and encephalopathy resolved.  She is discharged on p.o. Augmentin for 2 more days to complete treatment course.  She is discharged with home health and DME as above.  Patient is at risk for  polypharmacy on multiple sedating medications.  Extensively discussed this with patient but she is not willing to compromise.  Recommend ongoing discussion.  See individual problem list below for more.   Problems addressed during this hospitalization Principal Problem:   CAP (community acquired pneumonia) Active Problems:   Acquired hypothyroidism   Hyperlipidemia   Essential hypertension   Chronic back pain   Anemia of chronic disease   DM2 (diabetes mellitus, type 2) (HCC)   Schizophrenia (HCC)   Acute renal failure superimposed on stage 3b chronic kidney disease (HCC)   Community acquired pneumonia   Pressure injury of skin   Community-acquired pneumonia: Presents with progressive SOB, dry cough and subjective fever.  CT chest/abdomen/pelvis without contrast raises concern for lingular and bibasilar infiltrate, and mild to moderate constipation.  COVID-19 PCR nonreactive.  Blood cultures NGTD.  Procalcitonin elevated.  Respiratory symptoms resolved. -Received CTX and Zithromax for 3 days and discharged on p.o. Augmentin for 2 more days.   AKI on CKD-3B: Baseline Cr 1.6 but previously as high as 4.8.  She is on Benicar/HCT and Demadex.  Creatinine trended from 2.9-1.47. Recent Labs    08/02/22 0130 09/27/22 2302 09/28/22 0405 09/29/22 0236 09/30/22 0604 11/05/22 2138 11/05/22 2149 11/06/22 0450 11/07/22 0524 11/08/22 0604  BUN 35* 126* 124* 93* 64* 44* 41* 41* 37* 31*  CREATININE 1.65* 4.84* 4.34* 2.73* 1.66* 2.13* 2.90* 2.21* 1.70* 1.47*  -Stopped some of home antihypertensive meds as below -Reassess renal function in 1 week    DM-2 with hyperlipidemia and CKD-3B: A1c 6.2% in 09/2022.  CBG within normal range. -Continue home meds   Essential hypertension: Normotensive off home antihypertensive meds for most part. -Stop some  of home antihypertensive meds as above.   Chronic back pain: On significant dose of Belbuca, Lyrica, oxycodone.  Last filled on 9/5. She is awake  but not quite alert.  She is at risk for polypharmacy and CO2 retention.  Extensively discussed risk and benefits. -Continue home meds.   Hypothyroidism -Continue home Synthroid   Anemia of chronic disease: Baseline Hgb about 9.  Stable -Continue monitoring   Overactive bladder -Continue home meds.   Physical deconditioning/ambulatory dysfunction: Wheelchair dependent at baseline. -Discharge home with home health and DME as above   Mood disorder/history of schizophrenia: Stable. -Continue home Geodon and Ativan.   At risk for polypharmacy: on multiple sedating medications.  Extensively discussed this with patient.  She is not willing to cut down or stop any of her meds. -Recommend ongoing discussion.   Morbid obesity control rating to back pain, affecting mobility and comorbidities. Body mass index is 49.97 kg/m.   Pressure skin injury: POA Pressure Injury 11/06/22 Sacrum Stage 1 -  Intact skin with non-blanchable redness of a localized area usually over a bony prominence. (Active)  11/06/22 0100  Location: Sacrum  Location Orientation:   Staging: Stage 1 -  Intact skin with non-blanchable redness of a localized area usually over a bony prominence.  Wound Description (Comments):   Present on Admission: Yes    Time spent 35 minutes  Vital signs Vitals:   11/07/22 1916 11/08/22 0421 11/08/22 0632 11/08/22 1406  BP: 110/64 (!) 124/53  (!) 104/59  Pulse: 97 97  91  Temp: 98.9 F (37.2 C) 99.2 F (37.3 C)  98.3 F (36.8 C)  Resp: 16 18  17   Weight:   (!) 138.9 kg   SpO2: 94% 92%  95%  TempSrc: Oral Oral       Discharge exam  GENERAL: No apparent distress.  Nontoxic. HEENT: MMM.  Vision and hearing grossly intact.  NECK: Supple.  No apparent JVD.  RESP:  No IWOB.  Fair aeration bilaterally. CVS:  RRR. Heart sounds normal.  ABD/GI/GU: BS+. Abd soft, NTND.  MSK/EXT:  Moves extremities. No apparent deformity. No edema.  SKIN: no apparent skin lesion or  wound NEURO: Awake and alert. Oriented appropriately.  No apparent focal neuro deficit. PSYCH: Calm. Normal affect.   Discharge Instructions Discharge Instructions     Diet - low sodium heart healthy   Complete by: As directed    Diet Carb Modified   Complete by: As directed    Discharge instructions   Complete by: As directed    It has been a pleasure taking care of you!  You were hospitalized due to pneumonia, kidney failure and altered mental status.  You have been treated with antibiotics for pneumonia.  We are discharging you more antibiotics to complete treatment course.  Your kidney function has recovered and you mentation has improved.  Note that combination of Belbuca, oxycodone, lorazepam, Lyrica, Flexeril, doxepin and Linzess can increase your risk of drowsiness, sedation, constipation, impaired judgment, respiratory depression that could potentially lead to death and impaired balance that could increase risk of fall. We strongly recommend talking to your doctors who prescribe them. We do not recommend driving, operating machinery or other activity that requires similar mental and physical engagement.     Take care,   Increase activity slowly   Complete by: As directed    No wound care   Complete by: As directed       Allergies as of 11/08/2022  Reactions   Bee Venom Anaphylaxis   Invokana [canagliflozin] Anaphylaxis   Dyspnea and urinary retention   Shellfish Allergy Anaphylaxis   Buprenorphine Hcl Other (See Comments)   Overly sedated with morphine drip.   Other Other (See Comments)   Cats - bad asthma, stopped up        Medication List     STOP taking these medications    amLODipine 5 MG tablet Commonly known as: NORVASC   Olmesartan-amLODIPine-HCTZ 40-10-12.5 MG Tabs       TAKE these medications    acetaminophen 650 MG CR tablet Commonly known as: TYLENOL Take 1,300 mg by mouth in the morning and at bedtime.   albuterol 108 (90 Base)  MCG/ACT inhaler Commonly known as: VENTOLIN HFA Inhale 1 puff into the lungs as needed for wheezing or shortness of breath.   amoxicillin-clavulanate 875-125 MG tablet Commonly known as: AUGMENTIN Take 1 tablet by mouth 2 (two) times daily for 2 days.   aspirin EC 81 MG tablet Take 1 tablet (81 mg total) by mouth daily.   Belbuca 750 MCG Film Generic drug: Buprenorphine HCl Take 750 mcg by mouth 2 (two) times daily.   calcitRIOL 0.25 MCG capsule Commonly known as: ROCALTROL Take 1 capsule (0.25 mcg total) by mouth 3 (three) times a week.   cyclobenzaprine 5 MG tablet Commonly known as: FLEXERIL Take 5 mg by mouth daily.   desmopressin 0.2 MG tablet Commonly known as: DDAVP Take 200 mcg by mouth at bedtime.   doxepin 50 MG capsule Commonly known as: SINEQUAN Take 50 mg by mouth at bedtime.   esomeprazole 40 MG capsule Commonly known as: NEXIUM Take 1 capsule (40 mg total) by mouth daily before breakfast.   famotidine 40 MG tablet Commonly known as: PEPCID Take 40 mg by mouth daily after supper.   ferrous sulfate 325 (65 FE) MG tablet Take 325 mg by mouth in the morning.   HumuLIN N KwikPen 100 UNIT/ML KwikPen Generic drug: Insulin NPH (Human) (Isophane) Inject 20 Units into the skin daily.   levothyroxine 200 MCG tablet Commonly known as: SYNTHROID Take 1 tablet (200 mcg total) by mouth daily before breakfast.   linaclotide 290 MCG Caps capsule Commonly known as: LINZESS Take 290 mcg by mouth daily before breakfast.   LORazepam 1 MG tablet Commonly known as: ATIVAN Take 1 mg by mouth in the morning, at noon, and at bedtime.   Myrbetriq 50 MG Tb24 tablet Generic drug: mirabegron ER Take 50 mg by mouth daily.   naloxone 4 MG/0.1ML Liqd nasal spray kit Commonly known as: NARCAN Place 1 spray into the nose as needed for opioid reversal.   OneTouch Delica Plus Lancet33G Misc 1 each by Other route 3 (three) times daily. Use to check blood sugar 3 times a  day   OneTouch Verio Reflect w/Device Kit Use as advised   OneTouch Verio test strip Generic drug: glucose blood TEST BLOOD SUGAR THREE TIMES DAILY   oxyCODONE 5 MG immediate release tablet Commonly known as: Oxy IR/ROXICODONE Take 5 mg by mouth in the morning and at bedtime.   polyethylene glycol 17 g packet Commonly known as: MIRALAX / GLYCOLAX Take 17 g by mouth daily as needed for mild constipation.   potassium chloride 10 MEQ tablet Commonly known as: KLOR-CON M Take 1 tablet (10 mEq total) by mouth daily.   pregabalin 300 MG capsule Commonly known as: LYRICA Take 300 mg by mouth 2 (two) times daily.   rosuvastatin 20 MG  tablet Commonly known as: CRESTOR Take 20 mg by mouth daily.   sucralfate 1 g tablet Commonly known as: CARAFATE Take 1 g by mouth daily.   Sure Comfort Pen Needles 32G X 6 MM Misc Generic drug: Insulin Pen Needle USE TO INJECT INSULIN FOUR TIMES DAILY   GNP UltiCare Pen Needles 32G X 4 MM Misc Generic drug: Insulin Pen Needle 1 Needle by Does not apply route 4 (four) times daily.   tolterodine 2 MG 24 hr capsule Commonly known as: DETROL LA Take 2 mg by mouth daily.   torsemide 20 MG tablet Commonly known as: DEMADEX Take 40 mg by mouth daily.   triamcinolone cream 0.1 % Commonly known as: KENALOG Apply 1 Application topically 2 (two) times daily.   Trulicity 4.5 MG/0.5ML Sopn Generic drug: Dulaglutide Inject 4.5 mg into the skin once a week.   valACYclovir 500 MG tablet Commonly known as: VALTREX Take 500 mg by mouth daily.   venlafaxine XR 150 MG 24 hr capsule Commonly known as: EFFEXOR-XR Take 150 mg by mouth 2 (two) times daily.   Vitamin D (Ergocalciferol) 1.25 MG (50000 UNIT) Caps capsule Commonly known as: DRISDOL Take 50,000 Units by mouth once a week.   Vitamin D-3 25 MCG (1000 UT) Caps Take 1,000 Units by mouth daily.   ziprasidone 60 MG capsule Commonly known as: GEODON Take 60 mg by mouth daily after  supper.               Durable Medical Equipment  (From admission, onward)           Start     Ordered   11/07/22 1453  For home use only DME Hospital bed  Once       Question Answer Comment  Length of Need Lifetime   The above medical condition requires: Patient requires the ability to reposition frequently   Head must be elevated greater than: 30 degrees   Bed type Semi-electric   Hoyer Lift Yes   Trapeze Bar Yes   Support Surface: Low Air loss Mattress      11/07/22 1453            Consultations: None  Procedures/Studies:   CT CHEST ABDOMEN PELVIS WO CONTRAST  Result Date: 11/05/2022 CLINICAL DATA:  Sepsis, acute kidney injury EXAM: CT CHEST, ABDOMEN AND PELVIS WITHOUT CONTRAST TECHNIQUE: Multidetector CT imaging of the chest, abdomen and pelvis was performed following the standard protocol without IV contrast. RADIATION DOSE REDUCTION: This exam was performed according to the departmental dose-optimization program which includes automated exposure control, adjustment of the mA and/or kV according to patient size and/or use of iterative reconstruction technique. COMPARISON:  CT chest dated 07/31/2022. CT abdomen/pelvis dated 05/06/2022. FINDINGS: CT CHEST FINDINGS Cardiovascular: The heart is normal in size. No pericardial effusion. No evidence of thoracic aortic aneurysm. Mild atherosclerotic calcifications of the arch Moderate) of the LAD and right coronary artery. Mediastinum/Nodes: No suspicious mediastinal lymphadenopathy. Status post right thyroidectomy. Left thyroid is within normal limits. Lungs/Pleura: Evaluation of the lung parenchyma is constrained by respiratory motion. Within that constraint, there are no suspicious pulmonary nodules, noting is stable perifissural lymph node along the right minor fissure (series 6/image 62). Mild dependent atelectasis in the posterior upper lobes. Patchy opacities in the lingula and bilateral lower lobes, some of which  reflects chronic scarring, although superimposed atelectasis is favored. Mild focal opacity in the superior segment left lower lobe (series 6/image 58) could reflect mild pneumonia in the appropriate clinical  setting. No pleural effusion or pneumothorax. Musculoskeletal: Visualized osseous structures are within normal limits. CT ABDOMEN PELVIS FINDINGS Hepatobiliary: Unenhanced liver is unremarkable. Status post cholecystectomy. No intrahepatic or extrahepatic ductal dilatation. Pancreas: Within normal limits. Spleen: Within normal limits. Adrenals/Urinary Tract: Adrenal glands are within normal limits. Kidneys are within normal limits.  No hydronephrosis. Bladder is underdistended and poorly evaluated. Stomach/Bowel: Stomach is within normal limits. No evidence of bowel obstruction. Normal appendix (series 2/image 82). No colonic wall thickening or inflammatory changes. Mild to moderate left colonic stool burden, suggesting mild constipation. Vascular/Lymphatic: No evidence of abdominal aortic aneurysm. No suspicious abdominopelvic lymphadenopathy. Reproductive: Status post hysterectomy. No adnexal masses. Other: No abdominopelvic ascites. Musculoskeletal: Visualized osseous structures are within normal limits. IMPRESSION: Lingular and bilateral lower lobe scarring/atelectasis. Superimposed mild left lower lobe pneumonia is possible in the appropriate clinical setting. Mild to moderate left colonic stool burden, suggesting mild constipation. Additional ancillary findings as above. Electronically Signed   By: Charline Bills M.D.   On: 11/05/2022 22:51   DG Chest Port 1 View  Result Date: 11/05/2022 CLINICAL DATA:  Shortness of breath and hypotension.  The EXAM: PORTABLE CHEST 1 VIEW COMPARISON:  AP Lat 09/27/2022 FINDINGS: Exam technically limited due to body habitus. Surgical clips consistent with right hemithyroidectomy are again shown. Stable cardiomegaly. Increased central vascular prominence and mild  basilar interstitial edema. Findings most likely due to CHF or fluid overload. There is increased patchy hazy opacity in both lower lung fields, denser on the left, which could be due to alveolar edema, pneumonia, atelectasis or combination. Small pleural effusions are beginning to form. The mid and upper lungs are generally clear. Stable mediastinum with lipomatosis and aortic atherosclerosis. No new osseous finding. IMPRESSION: 1. Increased central vascular prominence and mild basilar interstitial edema. 2. Increased patchy hazy opacity in both lower lung fields, denser on the left, which could be alveolar edema, pneumonia, atelectasis or combination. 3. Small pleural effusions. 4. Clinical correlation and radiographic follow-up recommended. Electronically Signed   By: Almira Bar M.D.   On: 11/05/2022 22:10       The results of significant diagnostics from this hospitalization (including imaging, microbiology, ancillary and laboratory) are listed below for reference.     Microbiology: Recent Results (from the past 240 hour(s))  Blood culture (routine x 2)     Status: None (Preliminary result)   Collection Time: 11/05/22  9:38 PM   Specimen: BLOOD  Result Value Ref Range Status   Specimen Description   Final    BLOOD LEFT ANTECUBITAL Performed at [facility], [address] [place], [place] [address]    Special Requests   Final    BOTTLES DRAWN AEROBIC AND ANAEROBIC Blood Culture adequate volume Performed at [facility], [address] [place], [place] [address]    Culture   Final    NO GROWTH 3 DAYS Performed at [facility], [address], [place], [place] [address]    Report Status PENDING  Incomplete  SARS Coronavirus 2 by RT PCR (hospital order, performed in [facility] hospital lab) *cepheid single result test* Anterior Nasal Swab     Status: None   Collection Time: 11/05/22  9:48 PM   Specimen: Anterior Nasal Swab  Result  Value Ref Range Status   SARS Coronavirus 2 by RT PCR NEGATIVE NEGATIVE Final    Comment: (NOTE) SARS-CoV-2 target nucleic acids are NOT DETECTED.  The SARS-CoV-2 RNA is generally detectable in upper and lower  respiratory specimens during the acute phase of infection. The lowest concentration of SARS-CoV-2 viral copies this assay can detect is 250 copies / mL. A negative result does not preclude SARS-CoV-2 infection and should not be used as the sole basis for treatment or other patient management decisions.  A negative result may occur with improper specimen collection / handling, submission of specimen other than nasopharyngeal swab, presence of viral mutation(s) within the areas targeted by this assay, and inadequate number of viral copies (<250 copies / mL). A negative result must be combined with clinical observations, patient history, and epidemiological information.  Fact Sheet for Patients:   RoadLapTop.co.za  Fact Sheet for Healthcare Providers: http://kim-miller.com/  This test is not yet approved or  cleared by the [place] FDA and has been authorized for detection and/or diagnosis of SARS-CoV-2 by FDA under an Emergency Use Authorization (EUA).  This EUA will remain in effect (meaning this test can be used) for the duration of the COVID-19 declaration under Section 564(b)(1) of the Act, 21 U.S.C. section 360bbb-3(b)(1), unless the authorization is terminated or revoked sooner.  Performed at [facility], [address] [place], [place] [address]   Blood culture (routine x 2)     Status: None (Preliminary result)   Collection Time: 11/05/22 11:35 PM   Specimen: BLOOD  Result Value Ref Range Status   Specimen Description   Final    BLOOD BLOOD LEFT FOREARM Performed at [facility], [address] [place], [place] [address]    Special Requests   Final    BOTTLES DRAWN AEROBIC  AND ANAEROBIC Blood Culture results may not be optimal due to an inadequate volume of blood received in culture bottles Performed at [facility], [address] [place], [place] [address]    Culture   Final    NO GROWTH 2 DAYS Performed at [facility], [address], [place], [place] [address]    Report Status PENDING  Incomplete     Labs:  CBC: Recent Labs  Lab 11/05/22 2138 11/05/22 2149 11/06/22 0450 11/07/22 0524 11/08/22 0604  WBC 10.1  --  11.6* 7.1 7.5  NEUTROABS 6.7  --  9.4*  --   --   HGB 9.2* 9.9* 9.0* 8.5* 9.2*  HCT 30.2* 29.0* 29.5* 27.6* 29.5*  MCV 87.3  --  86.5 87.1 85.3  PLT 227  --  191 178 186   BMP &GFR Recent Labs  Lab 11/05/22 2138 11/05/22 2149 11/05/22 2335 11/06/22 0450 11/07/22 0524 11/08/22 0604  NA 134* 136  --  135 141 142  K 4.0 4.3  --  4.5 4.3 4.2  CL 97* 99  --  100 106 106  CO2 25  --   --  24 24 26   GLUCOSE 105* 100*  --  101* 89 99  BUN 44* 41*  --  41* 37* 31*  CREATININE 2.13* 2.90*  --  2.21* 1.70* 1.47*  CALCIUM 9.1  --   --  8.8* 8.7* 9.0  MG  --   --  2.6* 2.4 2.2 2.2  PHOS  --   --   --  4.6 3.3 3.6   Estimated Creatinine Clearance: 53.4 mL/min (A) (by C-G formula based on SCr of 1.47 mg/dL (H)). Liver & Pancreas: Recent Labs  Lab 11/05/22 2138 11/06/22 0450 11/07/22 0524 11/08/22 0604  AST 15 13*  --   --   ALT 16 15  --   --  ALKPHOS 83 79  --   --   BILITOT 0.5 0.5  --   --   PROT 7.0 6.1*  --   --   ALBUMIN 3.8 3.4* 2.8* 3.0*   No results for input(s): "LIPASE", "AMYLASE" in the last 168 hours. No results for input(s): "AMMONIA" in the last 168 hours. Diabetic: No results for input(s): "HGBA1C" in the last 72 hours. Recent Labs  Lab 11/07/22 1202 11/07/22 1654 11/07/22 2036 11/08/22 0739 11/08/22 1126  GLUCAP 89 122* 113* 94 127*   Cardiac Enzymes: Recent Labs  Lab 11/05/22 2335  CKTOTAL 109   No results for input(s): "PROBNP" in the last 8760  hours. Coagulation Profile: No results for input(s): "INR", "PROTIME" in the last 168 hours. Thyroid Function Tests: No results for input(s): "TSH", "T4TOTAL", "FREET4", "T3FREE", "THYROIDAB" in the last 72 hours. Lipid Profile: No results for input(s): "CHOL", "HDL", "LDLCALC", "TRIG", "CHOLHDL", "LDLDIRECT" in the last 72 hours. Anemia Panel: No results for input(s): "VITAMINB12", "FOLATE", "FERRITIN", "TIBC", "IRON", "RETICCTPCT" in the last 72 hours. Urine analysis:    Component Value Date/Time   COLORURINE YELLOW 09/28/2022 0935   APPEARANCEUR CLEAR 09/28/2022 0935   APPEARANCEUR Clear 11/13/2013 1310   LABSPEC 1.011 09/28/2022 0935   LABSPEC 1.017 11/13/2013 1310   PHURINE 5.0 09/28/2022 0935   GLUCOSEU NEGATIVE 09/28/2022 0935   GLUCOSEU Negative 11/13/2013 1310   GLUCOSEU NEG mg/dL 84/69/6295 2841   HGBUR NEGATIVE 09/28/2022 0935   BILIRUBINUR NEGATIVE 09/28/2022 0935   BILIRUBINUR Negative 11/13/2013 1310   KETONESUR NEGATIVE 09/28/2022 0935   PROTEINUR NEGATIVE 09/28/2022 0935   UROBILINOGEN 0.2 11/12/2014 2057   NITRITE NEGATIVE 09/28/2022 0935   LEUKOCYTESUR NEGATIVE 09/28/2022 0935   LEUKOCYTESUR Trace 11/13/2013 1310   Sepsis Labs: Invalid input(s): "PROCALCITONIN", "LACTICIDVEN"   SIGNED:  Almon Hercules, MD  [facility] 11/08/2022, 4:25 PM

## 2022-11-10 LAB — CULTURE, BLOOD (ROUTINE X 2)
Culture: NO GROWTH
Special Requests: ADEQUATE

## 2022-11-11 ENCOUNTER — Other Ambulatory Visit: Payer: 59

## 2022-11-11 LAB — CULTURE, BLOOD (ROUTINE X 2): Culture: NO GROWTH

## 2022-11-18 ENCOUNTER — Other Ambulatory Visit: Payer: Self-pay | Admitting: Internal Medicine

## 2022-11-18 DIAGNOSIS — E1122 Type 2 diabetes mellitus with diabetic chronic kidney disease: Secondary | ICD-10-CM

## 2022-11-25 ENCOUNTER — Ambulatory Visit: Payer: 59 | Admitting: Orthopedic Surgery

## 2022-11-25 ENCOUNTER — Ambulatory Visit: Payer: 59 | Admitting: Internal Medicine

## 2022-12-03 ENCOUNTER — Telehealth: Payer: Self-pay

## 2022-12-03 NOTE — Telephone Encounter (Signed)
Received a call from Crystal at [facility]. She states that this patient passed away on November 22, 2022 and wants to know are you okay with signing the death certificate.She will be sending it to you through [facility]

## 2022-12-04 NOTE — Telephone Encounter (Signed)
Crystal has been notified

## 2022-12-06 ENCOUNTER — Other Ambulatory Visit: Payer: Self-pay | Admitting: Internal Medicine

## 2022-12-06 DIAGNOSIS — E1165 Type 2 diabetes mellitus with hyperglycemia: Secondary | ICD-10-CM

## 2022-12-19 DEATH — deceased

## 2022-12-29 IMAGING — MG MM DIGITAL SCREENING BILAT W/ TOMO AND CAD
6 of 12 series · 6 of 36 positions shown · non-contrast
Comparison: Previous exam(s).

CLINICAL DATA: Screening.

EXAM:
DIGITAL SCREENING BILATERAL MAMMOGRAM WITH TOMOSYNTHESIS AND CAD
TECHNIQUE: Bilateral screening digital craniocaudal and mediolateral oblique
mammograms were obtained. Bilateral screening digital breast
tomosynthesis was performed. The images were evaluated with
computer-aided detection.

[R CC synth-2D (1 of 3)]
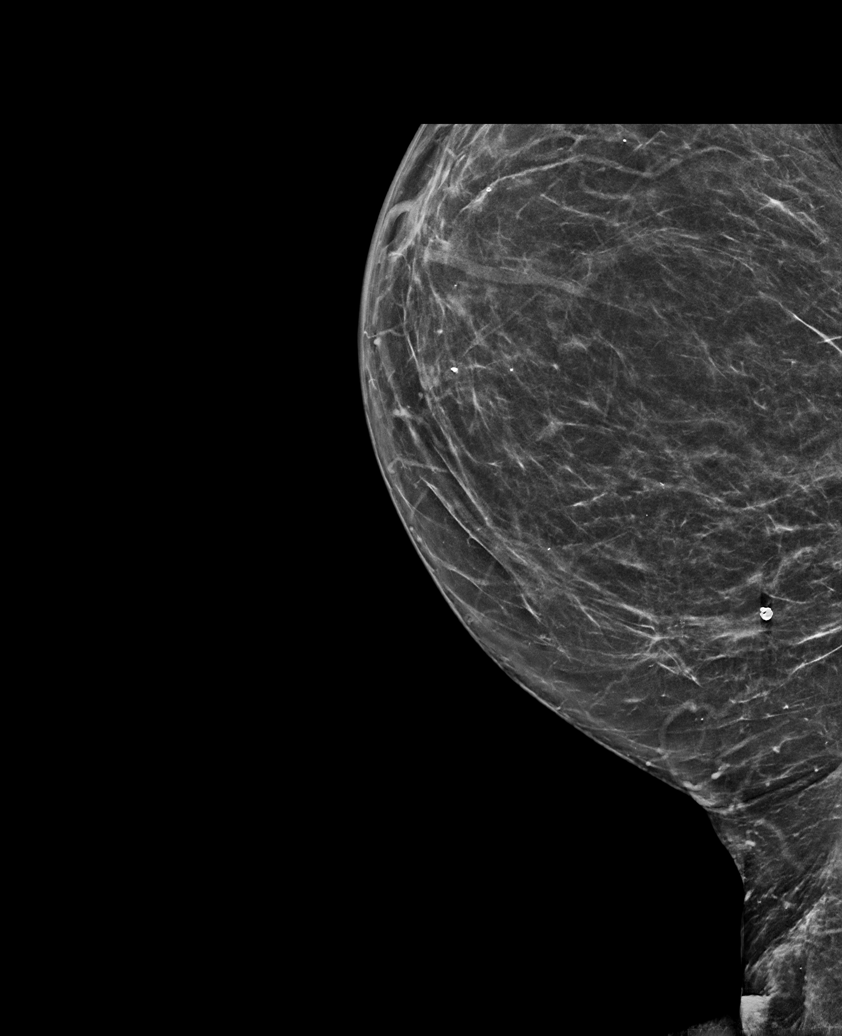

[L MLO synth-2D]
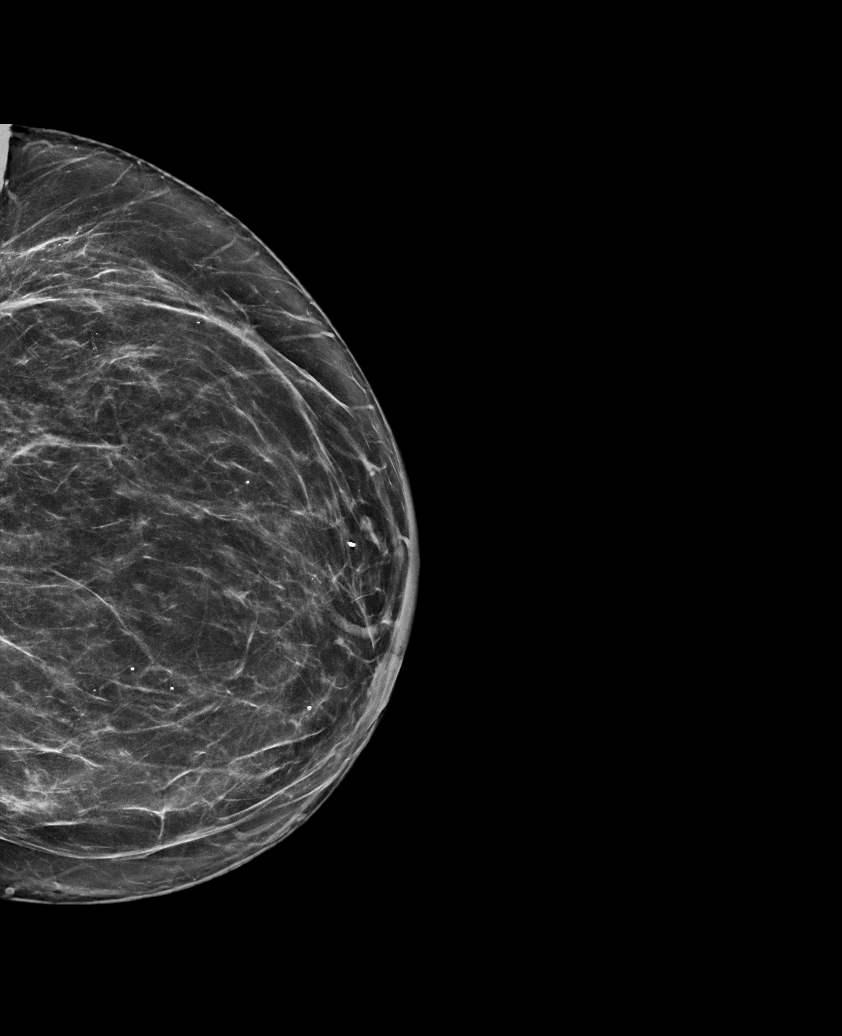

[R CC synth-2D (2 of 3)]
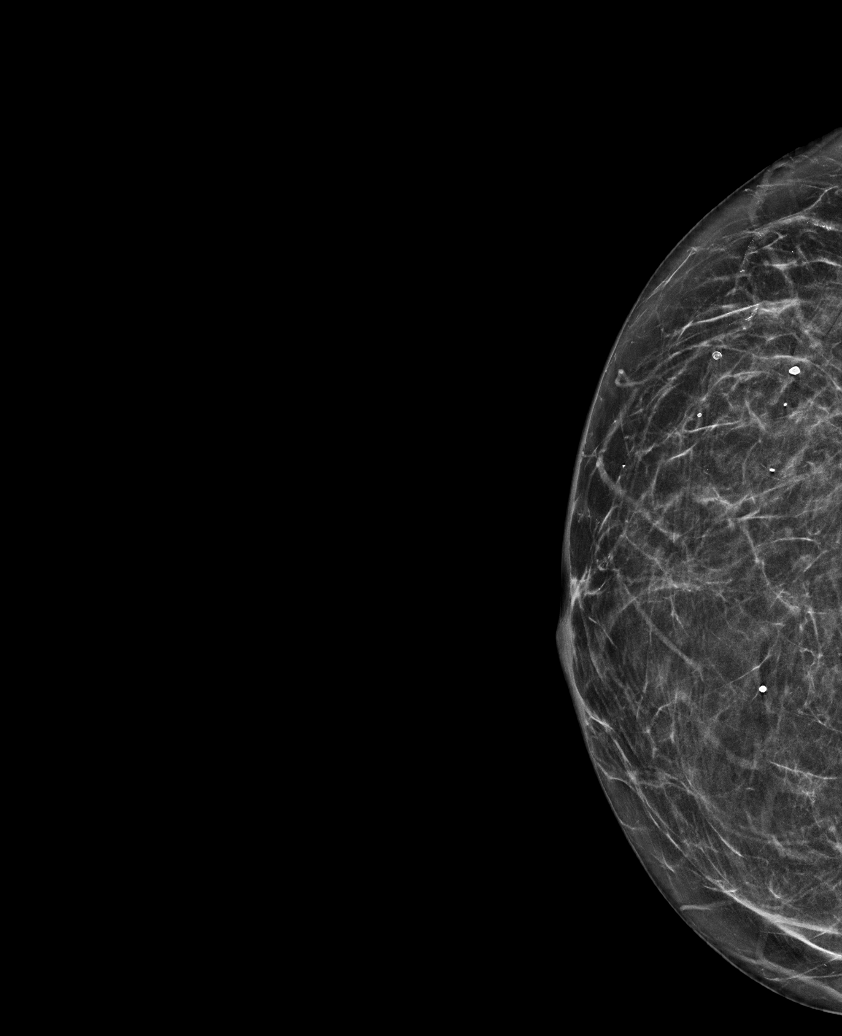

[R MLO synth-2D]
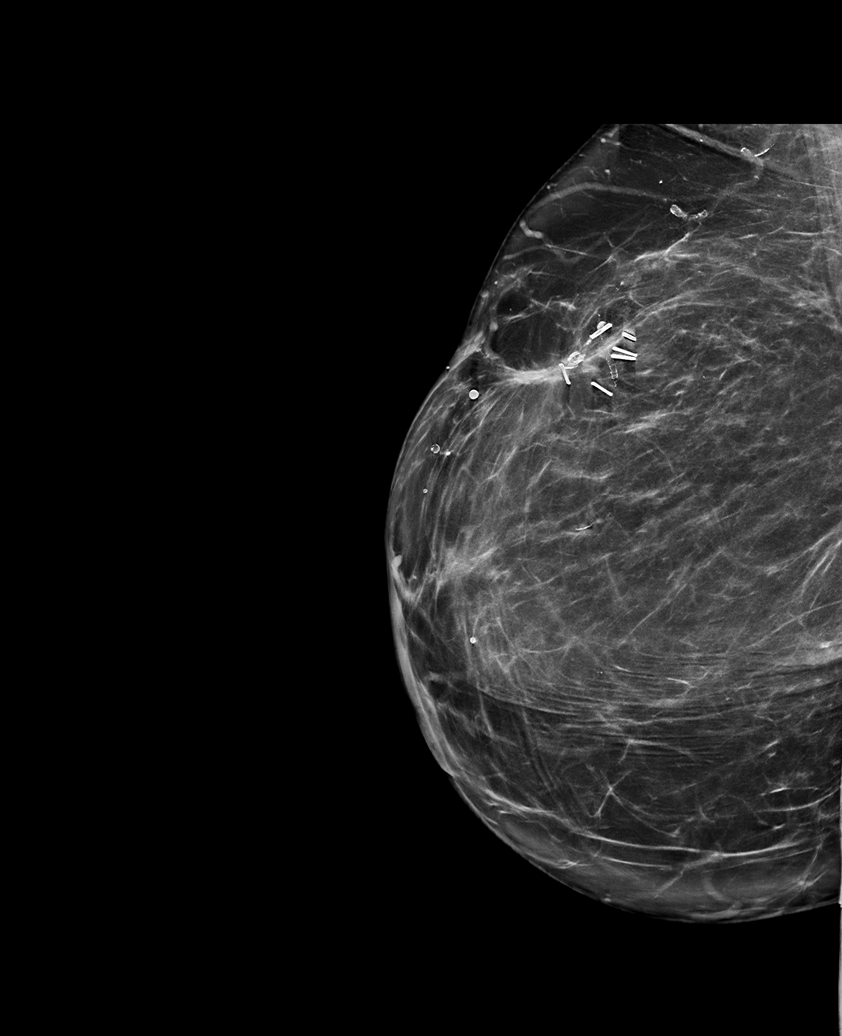

[R CC synth-2D (3 of 3)]
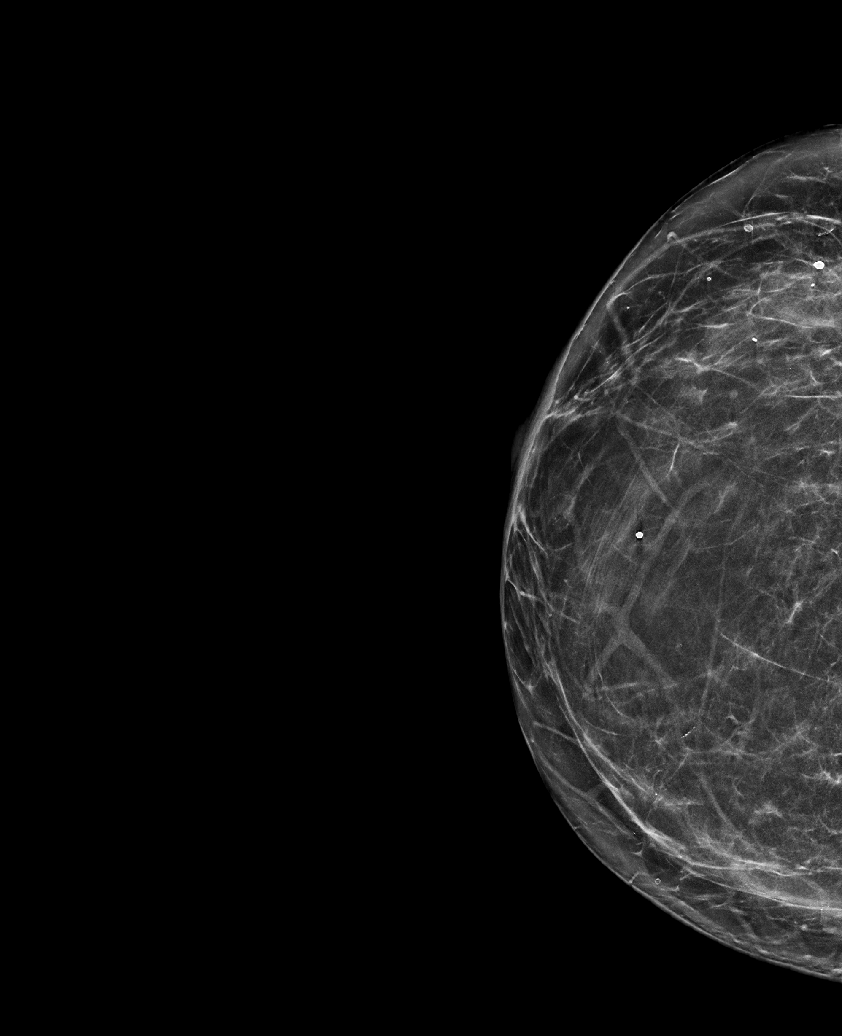

[L CC synth-2D]
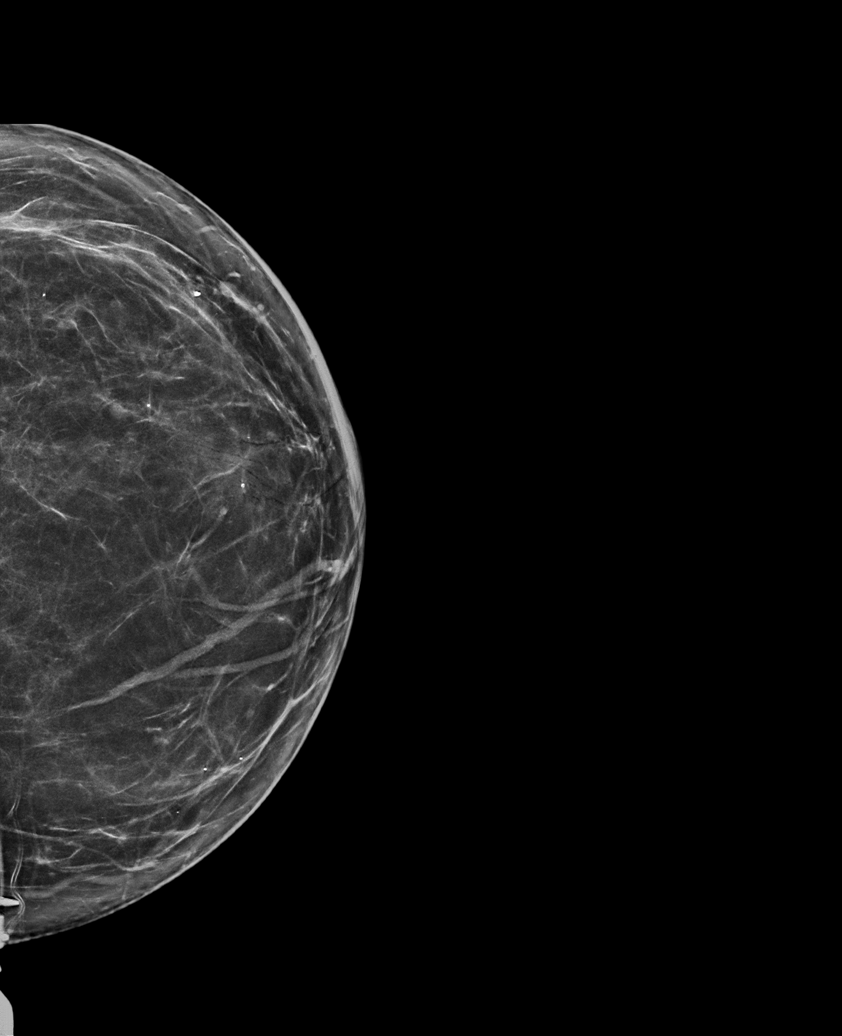

[6 of 36 positions shown; findings below may reference images not displayed]

ACR Breast Density Category b: There are scattered areas of
fibroglandular density.
FINDINGS: In the right breast an asymmetry requires further evaluation.

In the left breast an asymmetry requires further evaluation.
IMPRESSION: Further evaluation is suggested for possible  in the right breast.

Further evaluation is suggested for possible  in the left breast.

RECOMMENDATION:
Diagnostic mammogram and possibly ultrasound of both breasts.
(Code:GX-A-228)

The patient will be contacted regarding the findings, and additional
imaging will be scheduled.

BI-RADS CATEGORY  0: Incomplete. Need additional imaging evaluation
and/or prior mammograms for comparison.

## 2023-01-21 IMAGING — MG DIGITAL DIAGNOSTIC BILAT W/ TOMO W/ CAD
4 series · 4 of 12 positions shown · non-contrast
Comparison: Previous exam(s).

CLINICAL DATA: Recall from screening mammography, possible
asymmetries in both breasts which were visible only on the CC views.

EXAM:
DIGITAL DIAGNOSTIC BILATERAL MAMMOGRAM WITH TOMOSYNTHESIS AND CAD;
ULTRASOUND LEFT BREAST LIMITED
TECHNIQUE: Bilateral digital diagnostic mammography and breast tomosynthesis
was performed. The images were evaluated with computer-aided
detection.; Targeted ultrasound examination of the left breast was
performed.

[R CC synth-2D]
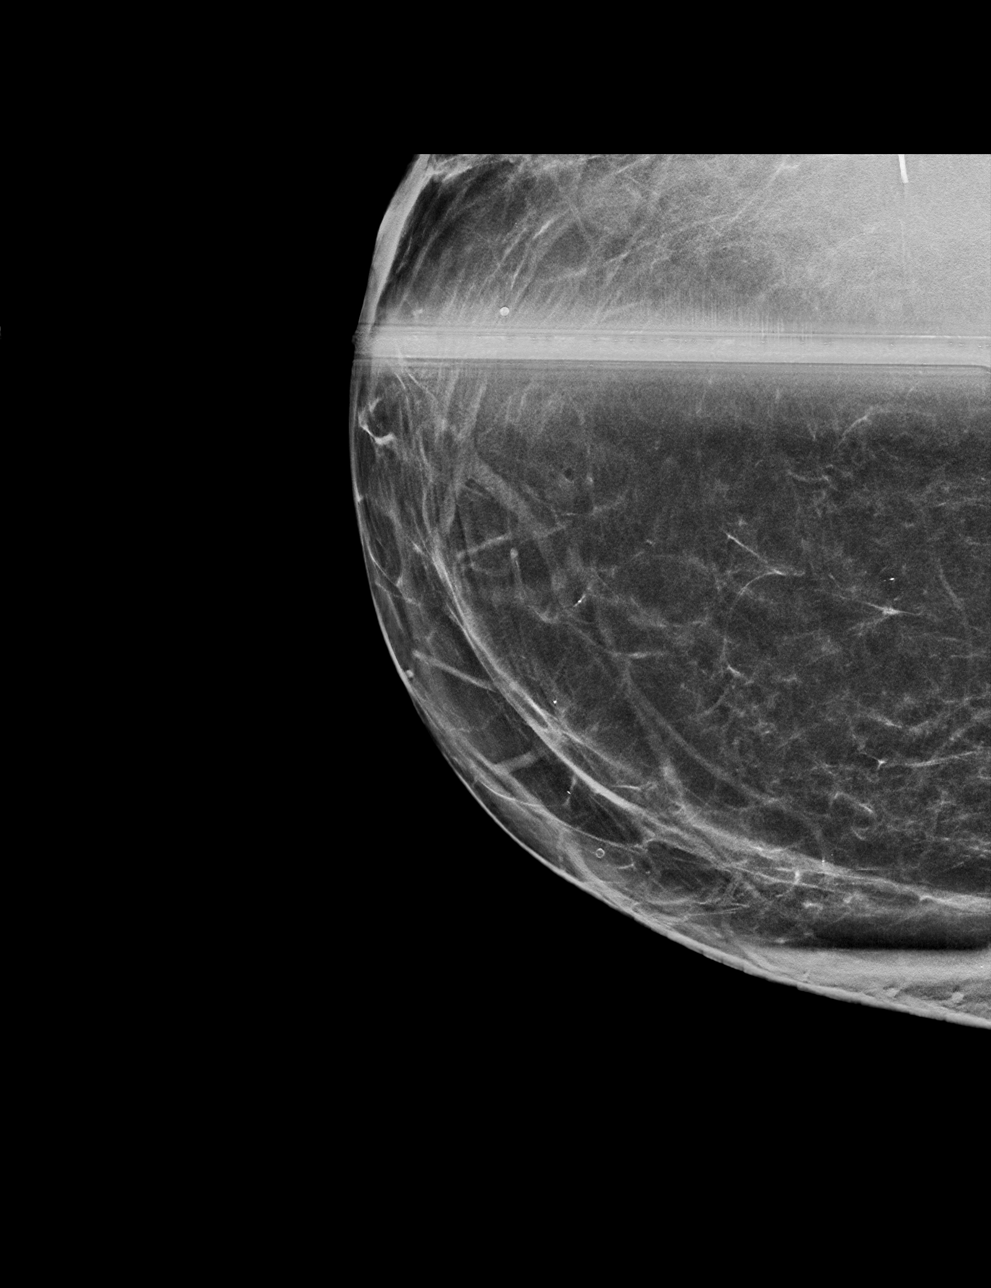

[L CC synth-2D]
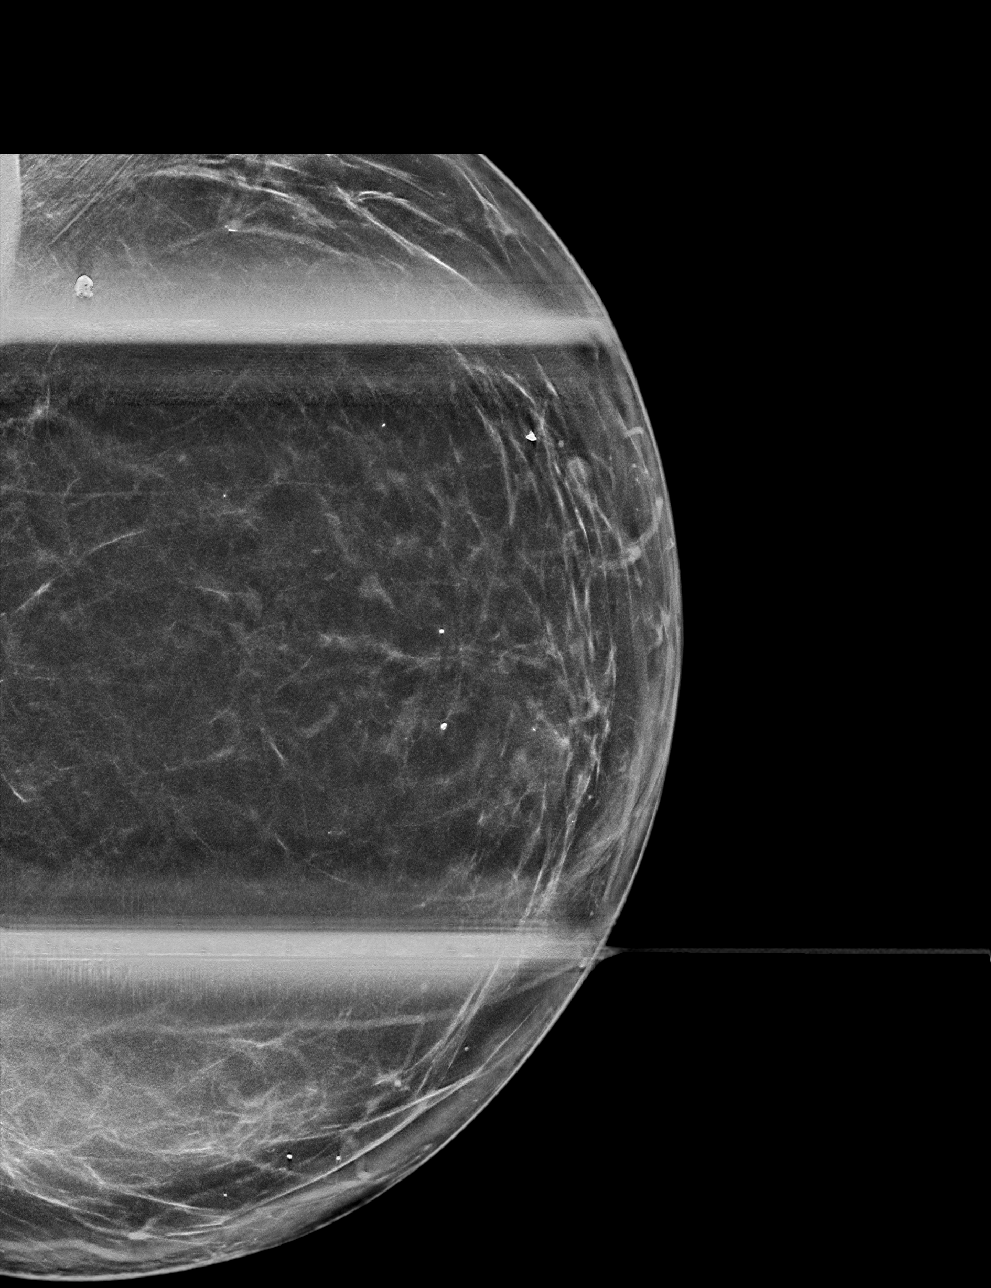

[L CC tomo · tomo slice 34/67.0]
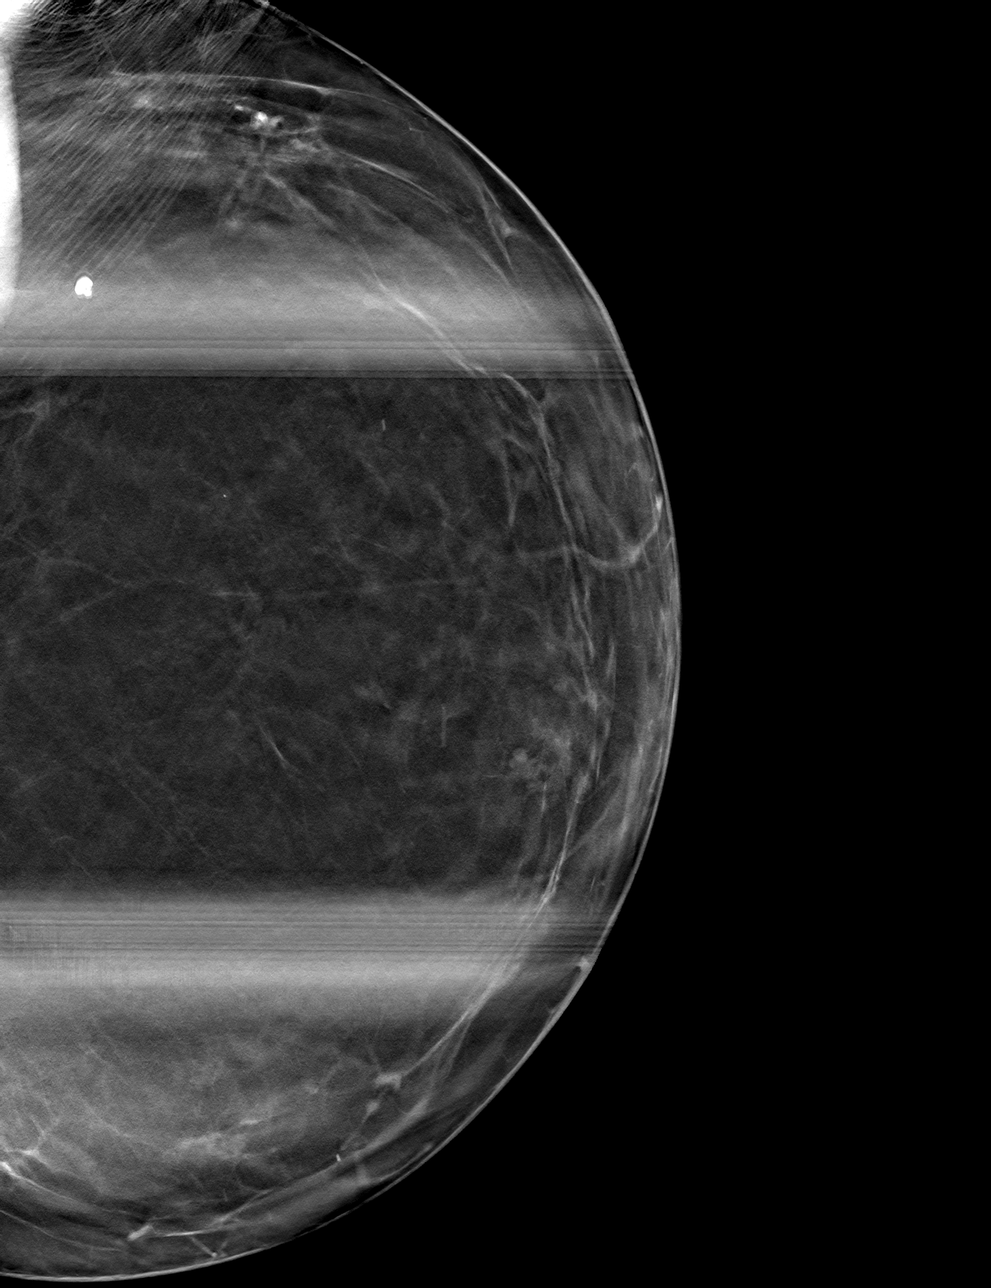

[R CC tomo · tomo slice 33/65.0]
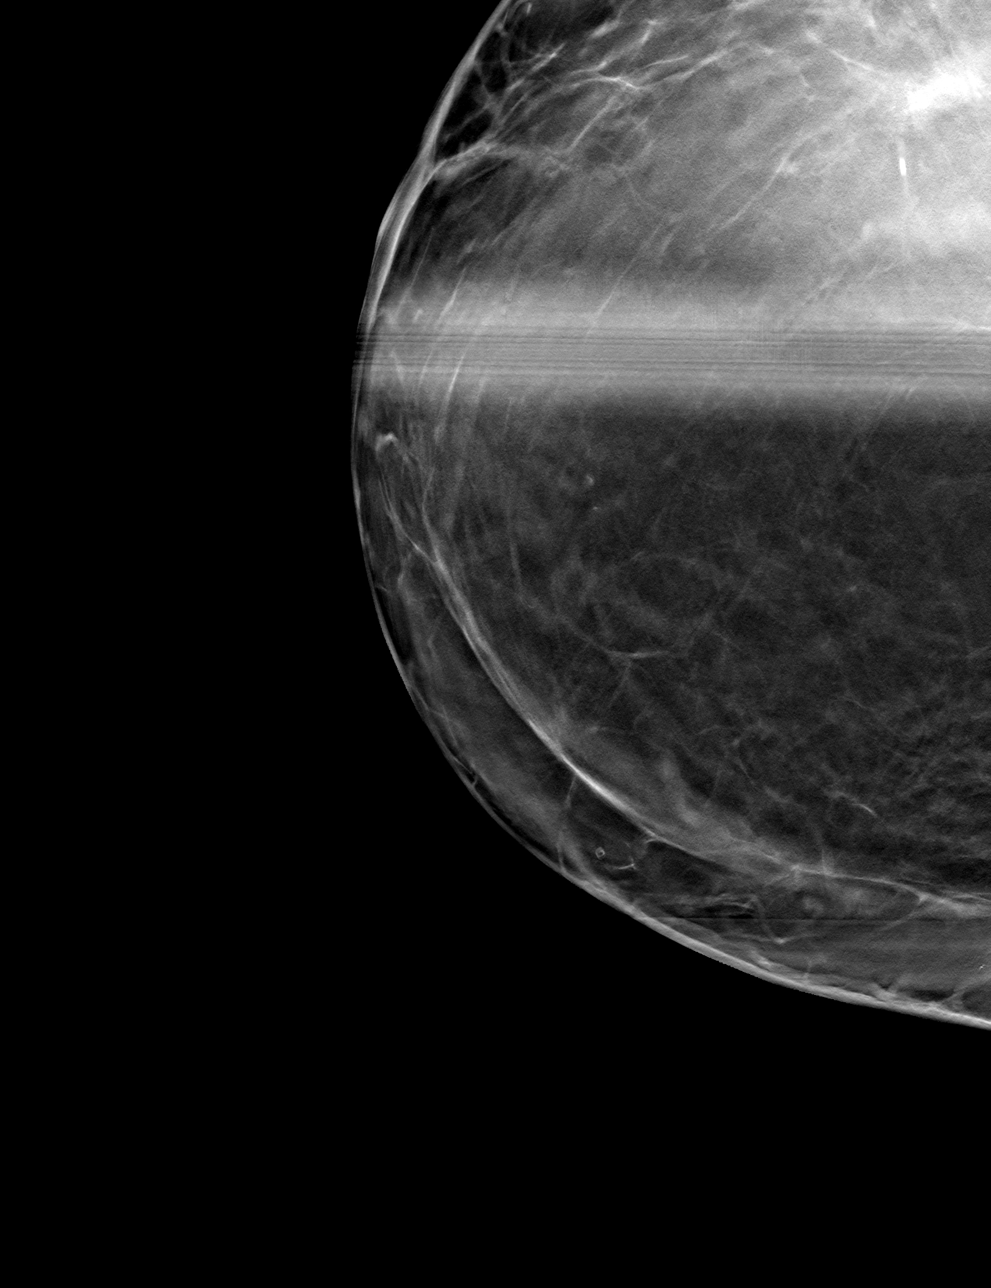

[4 of 12 positions shown; findings below may reference images not displayed]

ACR Breast Density Category b: There are scattered areas of
fibroglandular density.
FINDINGS: Spot-compression CC views of the areas of concern in both breasts
were obtained. Lateral images were not obtained due to the fact that
the patient is confined to a motorized wheelchair.

RIGHT: The asymmetry in the inner breast at anterior to middle depth
disperses with compression, indicating overlapping fibroglandular
tissue. There is no underlying mass or architectural distortion.

LEFT: Circumscribed isodense mass possibly associated with focal
duct ectasia in the retroareolar location at anterior depth,
measuring approximately 4 mm, localizing to the lower breast on the
tomosynthesis images, therefore likely at or near 6 o'clock
location. There is no associated architectural distortion or
suspicious calcifications.

Targeted ultrasound is performed, demonstrating an anechoic mass
with internal echoes at the 6 o'clock position 4 cm from the nipple
measuring approximately 4 x 3 x 3 mm, demonstrating mixed posterior
characteristics and no internal power Doppler flow, likely
corresponding to the screening mammographic finding.
IMPRESSION: 1. Likely benign 4 mm complicated cyst in the lower LEFT breast at 6
o'clock 4 cm from the nipple which accounts for the screening
mammographic finding.
2. No mammographic evidence of malignancy involving the RIGHT
breast.

RECOMMENDATION:
Diagnostic LEFT mammogram and LEFT breast ultrasound in 6 months.

I have discussed the findings and recommendations with the patient.
If applicable, a reminder letter will be sent to the patient
regarding the next appointment.

BI-RADS CATEGORY  3: Probably benign.

## 2023-01-21 IMAGING — US US BREAST*L* LIMITED INC AXILLA
1 series · 7 of 7 positions shown · non-contrast
Comparison: Previous exam(s).

CLINICAL DATA: Recall from screening mammography, possible
asymmetries in both breasts which were visible only on the CC views.

EXAM:
DIGITAL DIAGNOSTIC BILATERAL MAMMOGRAM WITH TOMOSYNTHESIS AND CAD;
ULTRASOUND LEFT BREAST LIMITED
TECHNIQUE: Bilateral digital diagnostic mammography and breast tomosynthesis
was performed. The images were evaluated with computer-aided
detection.; Targeted ultrasound examination of the left breast was
performed.

[Series 1: us breast*left* limited inc axilla · 0.07mm/px · 7 acquisitions, 7 frames shown]
[im 1/7]
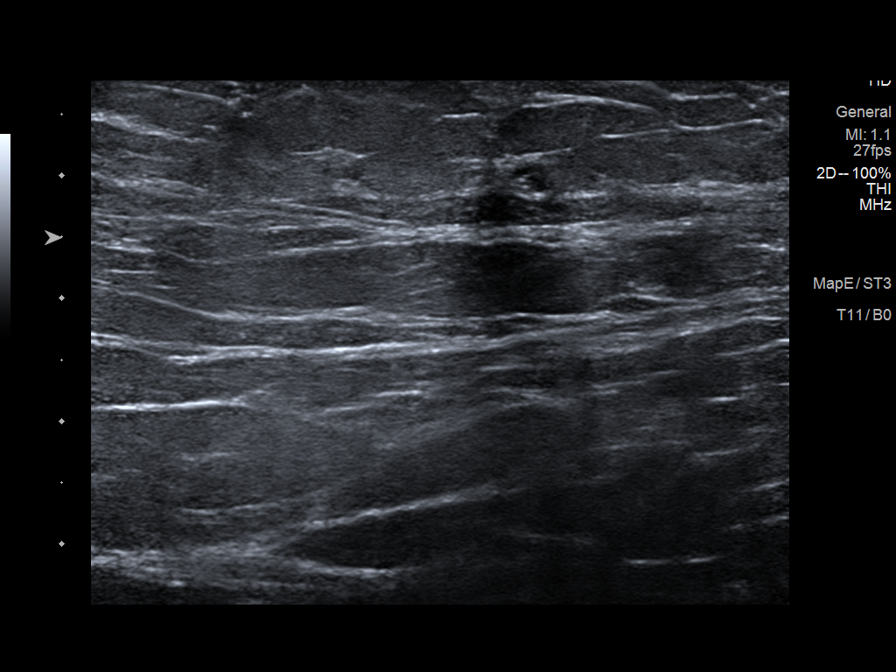
[im 2/7]
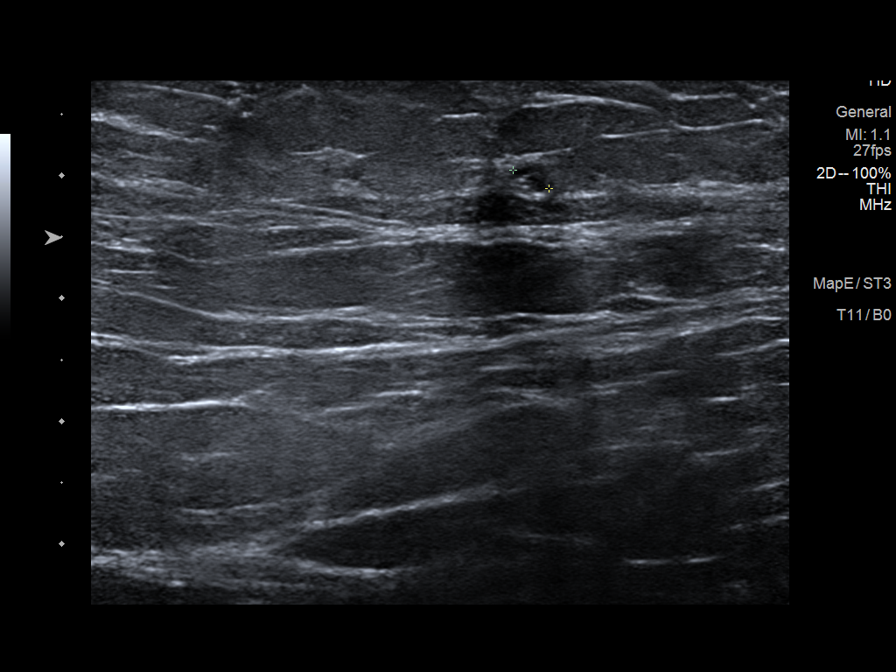
[im 3/7]
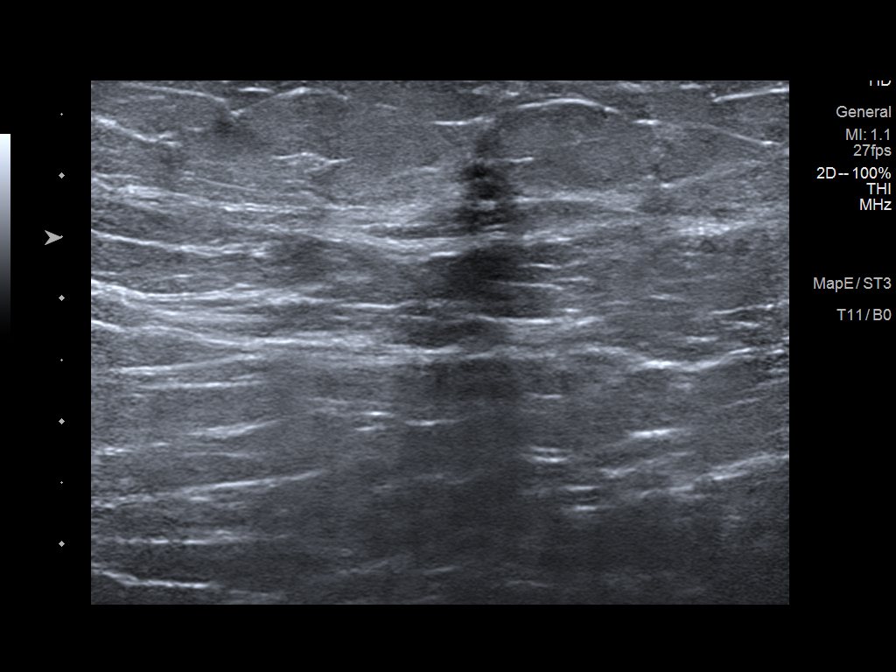
[im 4/7]
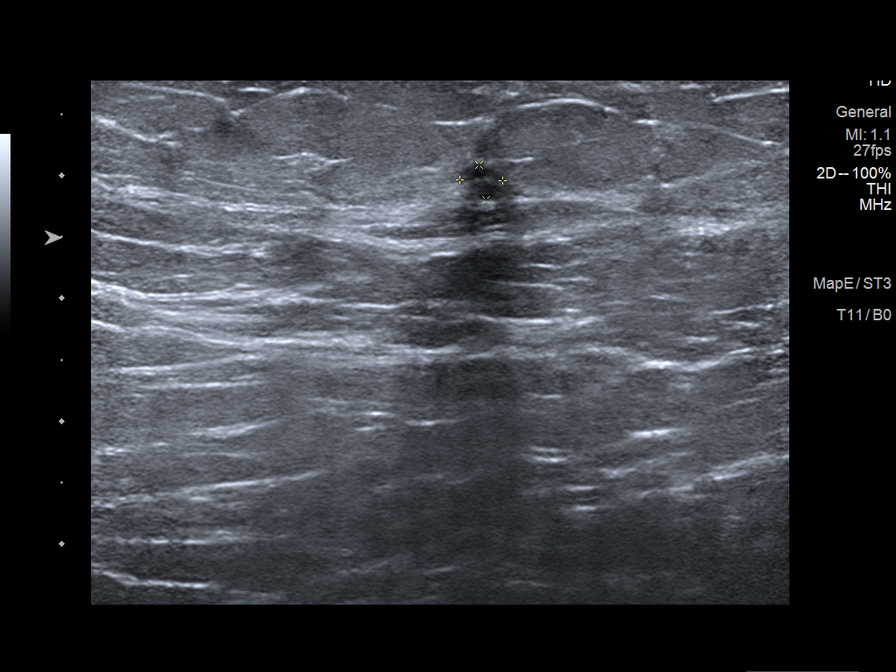
[im 5/7]
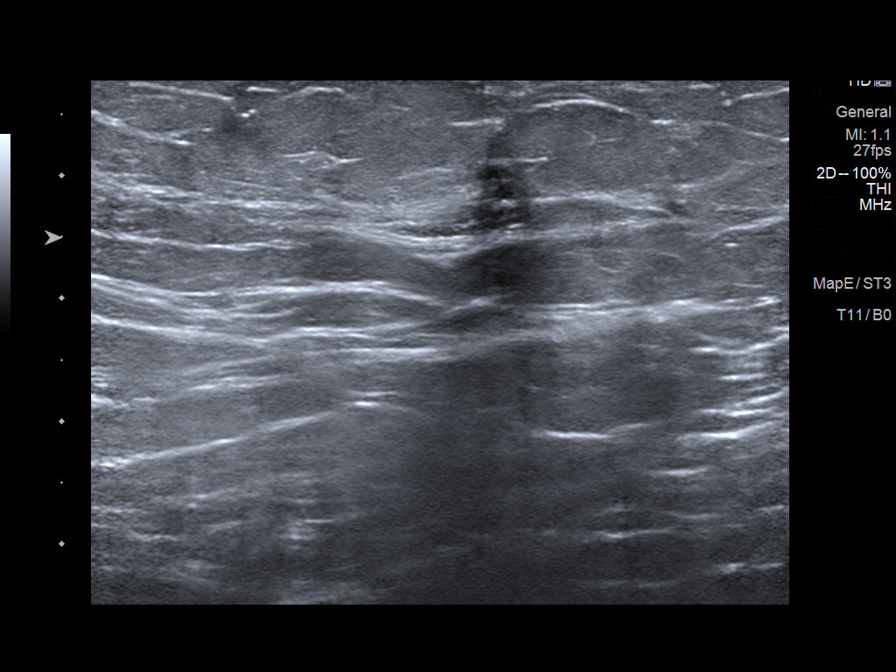
[im 6/7]
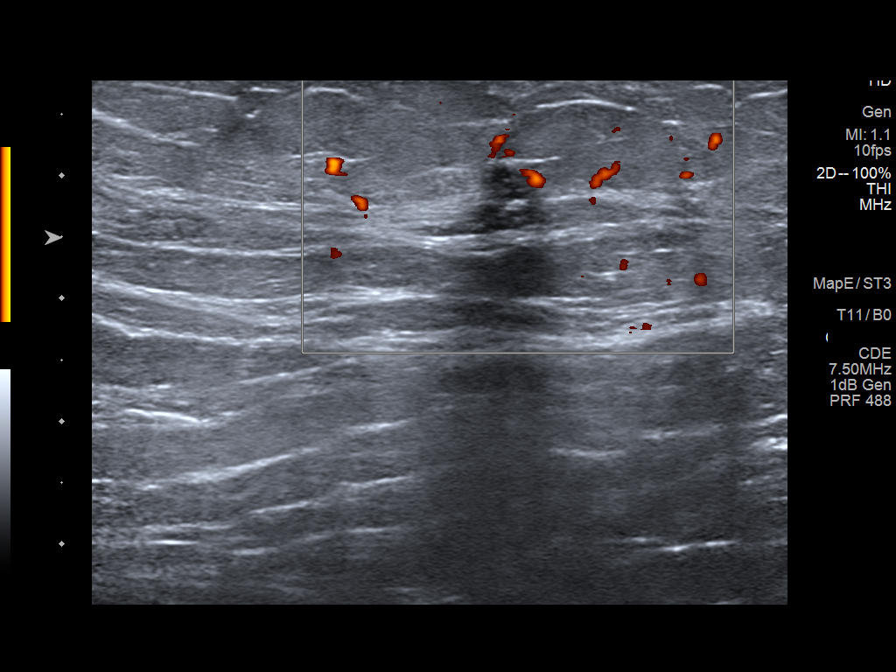
[im 7/7]
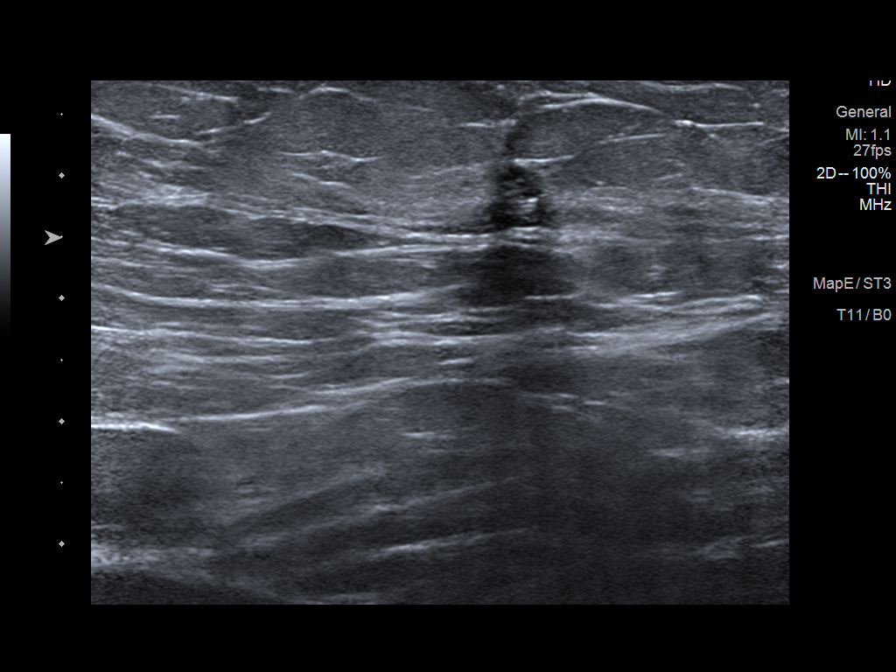

[7 of 7 positions shown; findings below may reference images not displayed]

ACR Breast Density Category b: There are scattered areas of
fibroglandular density.
FINDINGS: Spot-compression CC views of the areas of concern in both breasts
were obtained. Lateral images were not obtained due to the fact that
the patient is confined to a motorized wheelchair.

RIGHT: The asymmetry in the inner breast at anterior to middle depth
disperses with compression, indicating overlapping fibroglandular
tissue. There is no underlying mass or architectural distortion.

LEFT: Circumscribed isodense mass possibly associated with focal
duct ectasia in the retroareolar location at anterior depth,
measuring approximately 4 mm, localizing to the lower breast on the
tomosynthesis images, therefore likely at or near 6 o'clock
location. There is no associated architectural distortion or
suspicious calcifications.

Targeted ultrasound is performed, demonstrating an anechoic mass
with internal echoes at the 6 o'clock position 4 cm from the nipple
measuring approximately 4 x 3 x 3 mm, demonstrating mixed posterior
characteristics and no internal power Doppler flow, likely
corresponding to the screening mammographic finding.
IMPRESSION: 1. Likely benign 4 mm complicated cyst in the lower LEFT breast at 6
o'clock 4 cm from the nipple which accounts for the screening
mammographic finding.
2. No mammographic evidence of malignancy involving the RIGHT
breast.

RECOMMENDATION:
Diagnostic LEFT mammogram and LEFT breast ultrasound in 6 months.

I have discussed the findings and recommendations with the patient.
If applicable, a reminder letter will be sent to the patient
regarding the next appointment.

BI-RADS CATEGORY  3: Probably benign.

## 2023-03-17 ENCOUNTER — Other Ambulatory Visit: Payer: Self-pay | Admitting: Internal Medicine

## 2023-03-17 DIAGNOSIS — E119 Type 2 diabetes mellitus without complications: Secondary | ICD-10-CM

## 2023-04-16 ENCOUNTER — Ambulatory Visit: Payer: 59 | Admitting: Internal Medicine
